# Patient Record
Sex: Male | Born: 1975 | Race: Black or African American | Hispanic: No | Marital: Married | Smoking: Former smoker
Health system: Southern US, Community
[De-identification: ages and names within clinical notes are randomized; demographics above are authoritative.]

## PROBLEM LIST (undated history)

## (undated) DIAGNOSIS — I10 Essential (primary) hypertension: Secondary | ICD-10-CM

## (undated) DIAGNOSIS — I619 Nontraumatic intracerebral hemorrhage, unspecified: Secondary | ICD-10-CM

## (undated) DIAGNOSIS — Z87442 Personal history of urinary calculi: Secondary | ICD-10-CM

## (undated) DIAGNOSIS — I2699 Other pulmonary embolism without acute cor pulmonale: Secondary | ICD-10-CM

## (undated) DIAGNOSIS — I82409 Acute embolism and thrombosis of unspecified deep veins of unspecified lower extremity: Secondary | ICD-10-CM

## (undated) DIAGNOSIS — F32A Depression, unspecified: Secondary | ICD-10-CM

## (undated) DIAGNOSIS — F329 Major depressive disorder, single episode, unspecified: Secondary | ICD-10-CM

## (undated) DIAGNOSIS — I639 Cerebral infarction, unspecified: Secondary | ICD-10-CM

## (undated) DIAGNOSIS — L730 Acne keloid: Secondary | ICD-10-CM

## (undated) DIAGNOSIS — G839 Paralytic syndrome, unspecified: Secondary | ICD-10-CM

## (undated) HISTORY — DX: Nontraumatic intracerebral hemorrhage, unspecified: I61.9

## (undated) HISTORY — DX: Depression, unspecified: F32.A

---
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
## 1999-01-27 ENCOUNTER — Emergency Department (HOSPITAL_COMMUNITY): Admission: EM | Admit: 1999-01-27 | Discharge: 1999-01-27 | Payer: Self-pay | Admitting: Emergency Medicine

## 1999-01-27 ENCOUNTER — Encounter: Payer: Self-pay | Admitting: Emergency Medicine

## 2000-08-18 ENCOUNTER — Emergency Department (HOSPITAL_COMMUNITY): Admission: EM | Admit: 2000-08-18 | Discharge: 2000-08-19 | Payer: Self-pay | Admitting: Emergency Medicine

## 2000-08-19 ENCOUNTER — Encounter: Payer: Self-pay | Admitting: Emergency Medicine

## 2001-04-29 ENCOUNTER — Encounter: Payer: Self-pay | Admitting: Emergency Medicine

## 2001-04-29 ENCOUNTER — Emergency Department (HOSPITAL_COMMUNITY): Admission: EM | Admit: 2001-04-29 | Discharge: 2001-04-29 | Payer: Self-pay | Admitting: *Deleted

## 2001-07-23 ENCOUNTER — Emergency Department (HOSPITAL_COMMUNITY): Admission: EM | Admit: 2001-07-23 | Discharge: 2001-07-23 | Payer: Self-pay | Admitting: *Deleted

## 2001-07-25 ENCOUNTER — Ambulatory Visit (HOSPITAL_COMMUNITY): Admission: RE | Admit: 2001-07-25 | Discharge: 2001-07-25 | Payer: Self-pay | Admitting: Emergency Medicine

## 2001-07-25 ENCOUNTER — Encounter: Payer: Self-pay | Admitting: Emergency Medicine

## 2001-08-13 ENCOUNTER — Encounter: Admission: RE | Admit: 2001-08-13 | Discharge: 2001-09-03 | Payer: Self-pay | Admitting: Orthopaedic Surgery

## 2004-07-09 ENCOUNTER — Emergency Department (HOSPITAL_COMMUNITY): Admission: EM | Admit: 2004-07-09 | Discharge: 2004-07-09 | Payer: Self-pay | Admitting: Emergency Medicine

## 2004-07-10 ENCOUNTER — Ambulatory Visit (HOSPITAL_BASED_OUTPATIENT_CLINIC_OR_DEPARTMENT_OTHER): Admission: RE | Admit: 2004-07-10 | Discharge: 2004-07-10 | Payer: Self-pay | Admitting: Orthopedic Surgery

## 2004-07-10 HISTORY — PX: INCISION AND DRAINAGE ABSCESS: SHX5864

## 2005-12-24 ENCOUNTER — Emergency Department (HOSPITAL_COMMUNITY): Admission: EM | Admit: 2005-12-24 | Discharge: 2005-12-24 | Payer: Self-pay | Admitting: Emergency Medicine

## 2007-03-13 ENCOUNTER — Emergency Department (HOSPITAL_COMMUNITY): Admission: EM | Admit: 2007-03-13 | Discharge: 2007-03-14 | Payer: Self-pay | Admitting: Emergency Medicine

## 2009-12-30 ENCOUNTER — Emergency Department (HOSPITAL_COMMUNITY): Admission: EM | Admit: 2009-12-30 | Discharge: 2009-12-30 | Payer: Self-pay | Admitting: Emergency Medicine

## 2010-06-28 ENCOUNTER — Emergency Department (HOSPITAL_COMMUNITY): Admission: EM | Admit: 2010-06-28 | Discharge: 2010-06-28 | Payer: Self-pay | Admitting: Family Medicine

## 2011-02-07 LAB — CULTURE, ROUTINE-ABSCESS
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
## 2013-02-08 ENCOUNTER — Ambulatory Visit: Payer: Self-pay | Admitting: Family Medicine

## 2013-02-08 VITALS — BP 130/81 | HR 70 | Temp 97.7°F | Resp 18 | Ht 70.5 in | Wt 259.0 lb

## 2013-02-08 MED ORDER — DOXYCYCLINE HYCLATE 100 MG PO TABS: 100.0000 mg | ORAL_TABLET | Freq: Two times a day (BID) | ORAL | Status: DC

## 2013-02-08 MED ORDER — HYDROCODONE-ACETAMINOPHEN 5-325 MG PO TABS: 1.0000 | ORAL_TABLET | Freq: Four times a day (QID) | ORAL | Status: DC | PRN

## 2013-02-08 NOTE — Patient Instructions (Addendum)
Thank you for coming in today. Take the antibiotics for 10 days.   Take the pain medicine as needed.  Come back at the end of the week if it is not getting better.  Come back or go to the emergency room if you notice new weakness new numbness problems walking or bowel or bladder problems.  Pilonidal Cyst A pilonidal cyst occurs when hairs get trapped (ingrown) beneath the skin in the crease between the buttocks over your sacrum (the bone under that crease). Pilonidal cysts are most common in young men with a lot of body hair. When the cyst is ruptured (breaks) or leaking, fluid from the cyst may cause burning and itching. If the cyst becomes infected, it causes a painful swelling filled with pus (abscess). The pus and trapped hairs need to be removed (often by lancing) so that the infection can heal. However, recurrence is common and an operation may be needed to remove the cyst. HOME CARE INSTRUCTIONS   If the cyst was NOT INFECTED:  Keep the area clean and dry. Bathe or shower daily. Wash the area well with a germ-killing soap. Warm tub baths may help prevent infection and help with drainage. Dry the area well with a towel.  Avoid tight clothing to keep area as moisture free as possible.  Keep area between buttocks as free of hair as possible. A depilatory may be used.  If the cyst WAS INFECTED and needed to be drained:  Your caregiver packed the wound with gauze to keep the wound open. This allows the wound to heal from the inside outwards and continue draining.  Return for a wound check in 1 day or as suggested.  If you take tub baths or showers, repack the wound with gauze following them. Sponge baths (at the sink) are a good alternative.  If an antibiotic was ordered to fight the infection, take as directed.  Only take over-the-counter or prescription medicines for pain, discomfort, or fever as directed by your caregiver.  After the drain is removed, use sitz baths for 20 minutes  4 times per day. Clean the wound gently with mild unscented soap, pat dry, and then apply a dry dressing. SEEK MEDICAL CARE IF:   You have increased pain, swelling, redness, drainage, or bleeding from the area.  You have a fever.  You have muscles aches, dizziness, or a general ill feeling. Document Released: 11/07/2000 Document Revised: 02/02/2012 Document Reviewed: 01/05/2009 [facility] Patient Information 2013 [place], [place].

## 2013-02-08 NOTE — Progress Notes (Signed)
Matthew Black is a 37 y.o. male who presents to [facility] today for pain in his low back.  Patient is has developed pain in his coccyx area over the last several days it has worsened. He is a tender spot overlaying his coccyx. He was told this may be a cyst. He denies any fevers chills radiating pain weakness or numbness. He has never had a history of abscess requiring drainage in this area in the past. He notes the area is tender to palpation.     PMH: Reviewed otherwise healthy History  Substance Use Topics  . Smoking status: Current Every Day Smoker -- 0.25 packs/day for 7 years    Types: Cigarettes  . Smokeless tobacco: Not on file  . Alcohol Use: No   ROS as above  Medications reviewed. No current outpatient prescriptions on file.   No current facility-administered medications for this visit.    Exam:  BP 130/81  Pulse 70  Temp(Src) 97.7 F (36.5 C) (Oral)  Resp 18  Ht 5' 10.5" (1.791 m)  Wt 259 lb (117.482 kg)  BMI 36.63 kg/m2  SpO2 97% Gen: Well NAD SKIN: Minimal Fluctuant area with hyperpigmentation overlying the coccyx at the superior aspect of the cleft of the buttocks.  No pus is expressible.   Not very TTP.  Back: Nontender over lumbar thoracic spine. Nontender paraspinal muscles. Normal motion. Pulses capillary refill and sensation are intact distal lower extremities bilaterally.  No results found for this or any previous visit (from the past 72 hour(s)).  Assessment and Plan: 37 y.o. male with pilonidal cyst vs ingrown hair.  Minimal fluctuance. Not very TTP  Not yet ready for drainage if needed at all.  Plan: Antibiotics, and some pain medication.  F/u 3 days if not improving or sooner if worsening

## 2013-08-20 DIAGNOSIS — F17200 Nicotine dependence, unspecified, uncomplicated: Secondary | ICD-10-CM | POA: Insufficient documentation

## 2013-08-20 DIAGNOSIS — Z79899 Other long term (current) drug therapy: Secondary | ICD-10-CM | POA: Insufficient documentation

## 2013-08-20 DIAGNOSIS — R000 Tachycardia, unspecified: Secondary | ICD-10-CM | POA: Insufficient documentation

## 2013-08-20 DIAGNOSIS — Z792 Long term (current) use of antibiotics: Secondary | ICD-10-CM | POA: Insufficient documentation

## 2013-08-20 DIAGNOSIS — L0231 Cutaneous abscess of buttock: Secondary | ICD-10-CM | POA: Insufficient documentation

## 2013-08-20 DIAGNOSIS — L0501 Pilonidal cyst with abscess: Secondary | ICD-10-CM | POA: Insufficient documentation

## 2013-08-20 NOTE — ED Notes (Signed)
Pt states boil on left buttock the size of two quarters. Since Wednesday.

## 2013-08-21 ENCOUNTER — Emergency Department (HOSPITAL_COMMUNITY)
Admission: EM | Admit: 2013-08-21 | Discharge: 2013-08-21 | Disposition: A | Discharge status: Home / Self Care | Payer: Self-pay | Attending: Emergency Medicine | Admitting: Emergency Medicine

## 2013-08-21 DIAGNOSIS — L0591 Pilonidal cyst without abscess: Secondary | ICD-10-CM

## 2013-08-21 DIAGNOSIS — L0231 Cutaneous abscess of buttock: Secondary | ICD-10-CM

## 2013-08-21 MED ORDER — IBUPROFEN 400 MG PO TABS
600.0000 mg | ORAL_TABLET | Freq: Once | ORAL | Status: AC
  Administered 2013-08-21: 600 mg via ORAL
  Filled 2013-08-21 (×2): qty 1

## 2013-08-21 MED ORDER — SULFAMETHOXAZOLE-TMP DS 800-160 MG PO TABS: 1.0000 | ORAL_TABLET | Freq: Two times a day (BID) | ORAL | Status: DC

## 2013-08-21 MED ORDER — HYDROCODONE-ACETAMINOPHEN 5-325 MG PO TABS: 1.0000 | ORAL_TABLET | Freq: Four times a day (QID) | ORAL | Status: DC | PRN

## 2013-08-21 MED ORDER — SULFAMETHOXAZOLE-TMP DS 800-160 MG PO TABS
1.0000 | ORAL_TABLET | Freq: Once | ORAL | Status: AC
  Administered 2013-08-21: 1 via ORAL
  Filled 2013-08-21: qty 1

## 2013-08-21 MED ORDER — IBUPROFEN 600 MG PO TABS: 600.0000 mg | ORAL_TABLET | Freq: Four times a day (QID) | ORAL | Status: DC | PRN

## 2013-08-21 NOTE — ED Provider Notes (Signed)
Medical screening examination/treatment/procedure(s) were performed by non-physician practitioner and as supervising physician I was immediately available for consultation/collaboration.   Amiri Riechers M Kelley Polinsky, MD 08/21/13 0826 

## 2013-08-21 NOTE — ED Provider Notes (Signed)
CSN: [id number]     Arrival date & time 08/20/13  2245 History   First MD Initiated Contact with Patient 08/21/13 0017     Chief Complaint  Patient presents with  . Abscess   (Consider location/radiation/quality/duration/timing/severity/associated sxs/prior Treatment) HPI Comments: Abscess L buttock for the past 2 days "just trying to tough it out"  Patient is a 37 y.o. male presenting with abscess. The history is provided by the patient.  Abscess Location:  Ano-genital Ano-genital abscess location:  L buttock Abscess quality: induration, painful, redness and warmth   Abscess quality: not draining and no fluctuance   Red streaking: no   Pain details:    Duration:  3 days   Timing:  Constant   Progression:  Worsening Chronicity:  New Context: injected drug use   Context: not diabetes and not immunosuppression   Relieved by:  Nothing Associated symptoms: no fever     No past medical history on file. No past surgical history on file. No family history on file. History  Substance Use Topics  . Smoking status: Current Every Day Smoker -- 0.25 packs/day for 7 years    Types: Cigarettes  . Smokeless tobacco: Not on file  . Alcohol Use: No    Review of Systems  Constitutional: Negative for fever and chills.  Musculoskeletal: Negative for myalgias.  Skin: Positive for wound.  All other systems reviewed and are negative.    Allergies  Review of patient's allergies indicates no known allergies.  Home Medications   Current Outpatient Rx  Name  Route  Sig  Dispense  Refill  . doxycycline (VIBRA-TABS) 100 MG tablet   Oral   Take 1 tablet (100 mg total) by mouth 2 (two) times daily.   20 tablet   0   . HYDROcodone-acetaminophen (NORCO) 5-325 MG per tablet   Oral   Take 1 tablet by mouth every 6 (six) hours as needed for pain.   20 tablet   0   . ibuprofen (ADVIL,MOTRIN) 600 MG tablet   Oral   Take 1 tablet (600 mg total) by mouth every 6 (six) hours as needed for  pain.   30 tablet   0   . sulfamethoxazole-trimethoprim (BACTRIM DS) 800-160 MG per tablet   Oral   Take 1 tablet by mouth 2 (two) times daily.   13 tablet   0    BP 137/90  Pulse 102  Temp(Src) 98.3 F (36.8 C) (Oral)  Resp 19  SpO2 97% Physical Exam  Nursing note and vitals reviewed. Constitutional: He is oriented to person, place, and time. He appears well-developed and well-nourished.  HENT:  Head: Normocephalic.  Eyes: Pupils are equal, round, and reactive to light.  Neck: Normal range of motion.  Cardiovascular: Regular rhythm.  Tachycardia present.   Pulmonary/Chest: Effort normal.  Musculoskeletal: Normal range of motion. He exhibits edema.  Neurological: He is alert and oriented to person, place, and time.  Skin: Skin is warm.  3 CM X 2CM warm, red, raised area L buttock not involving the anus     ED Course  INCISION AND DRAINAGE Date/Time: 08/21/2013 1:30 AM Performed by: Arman Filter Authorized by: Arman Filter Consent: Verbal consent obtained. written consent not obtained. Risks and benefits: risks, benefits and alternatives were discussed Consent given by: patient Patient understanding: patient states understanding of the procedure being performed Patient identity confirmed: verbally with patient Type: abscess Body area: anogenital (L buttock) Anesthesia: local infiltration Local anesthetic: lidocaine 1% with epinephrine  Anesthetic total: 3 ml Patient sedated: no Scalpel size: 11 Needle gauge: 22 Incision type: single straight Complexity: simple Drainage: purulent Drainage amount: moderate Wound treatment: wound left open Packing material: 1/4 in iodoform gauze Patient tolerance: Patient tolerated the procedure well with no immediate complications.   (including critical care time) Labs Review Labs Reviewed - No data to display Imaging Review No results found.  MDM   1. Abscess of buttock, left   2. Pilonidal cyst    I&D preformed  with start on antibiotic--Septra patient to remove packing on Wednesday morning     Arman Filter, NP 08/21/13 0134

## 2013-10-05 ENCOUNTER — Encounter (HOSPITAL_COMMUNITY): Payer: Self-pay | Admitting: Emergency Medicine

## 2013-10-05 DIAGNOSIS — W268XXA Contact with other sharp object(s), not elsewhere classified, initial encounter: Secondary | ICD-10-CM | POA: Insufficient documentation

## 2013-10-05 DIAGNOSIS — Y929 Unspecified place or not applicable: Secondary | ICD-10-CM | POA: Insufficient documentation

## 2013-10-05 DIAGNOSIS — Y939 Activity, unspecified: Secondary | ICD-10-CM | POA: Insufficient documentation

## 2013-10-05 DIAGNOSIS — S8990XA Unspecified injury of unspecified lower leg, initial encounter: Secondary | ICD-10-CM | POA: Insufficient documentation

## 2013-10-05 DIAGNOSIS — F17200 Nicotine dependence, unspecified, uncomplicated: Secondary | ICD-10-CM | POA: Insufficient documentation

## 2013-10-05 MED ORDER — TETANUS-DIPHTH-ACELL PERTUSSIS 5-2.5-18.5 LF-MCG/0.5 IM SUSP: 0.5000 mL | Freq: Once | INTRAMUSCULAR | Status: DC

## 2013-10-05 NOTE — ED Notes (Signed)
Presents with left foot pain from stepping on a nail one week ago, unknown last tetanus shot. Puncture site has no redness or edema. Cms intact. Pain is worse with walking.

## 2013-10-06 ENCOUNTER — Emergency Department (HOSPITAL_COMMUNITY)
Admission: EM | Admit: 2013-10-06 | Discharge: 2013-10-06 | Discharge status: Against Medical Advice | Payer: Self-pay | Attending: Emergency Medicine | Admitting: Emergency Medicine

## 2013-10-06 NOTE — ED Notes (Signed)
Called No answer.

## 2014-09-12 ENCOUNTER — Encounter (HOSPITAL_COMMUNITY): Payer: Self-pay | Admitting: Emergency Medicine

## 2014-09-12 ENCOUNTER — Emergency Department (HOSPITAL_COMMUNITY)
Admission: EM | Admit: 2014-09-12 | Discharge: 2014-09-12 | Disposition: A | Discharge status: Home / Self Care | Payer: BC Managed Care – PPO | Source: Home / Self Care

## 2014-09-12 DIAGNOSIS — L739 Follicular disorder, unspecified: Secondary | ICD-10-CM

## 2014-09-12 DIAGNOSIS — L089 Local infection of the skin and subcutaneous tissue, unspecified: Secondary | ICD-10-CM

## 2014-09-12 MED ORDER — DOXYCYCLINE HYCLATE 100 MG PO CAPS: 100.0000 mg | ORAL_CAPSULE | Freq: Two times a day (BID) | ORAL | Status: DC

## 2014-09-12 NOTE — Discharge Instructions (Signed)

## 2014-09-12 NOTE — ED Provider Notes (Signed)
CSN: [id number]     Arrival date & time 09/12/14  0801 History   First MD Initiated Contact with Patient 09/12/14 [phone]     Chief Complaint  Patient presents with  . Mass   (Consider location/radiation/quality/duration/timing/severity/associated sxs/prior Treatment) HPI Comments: Approximately 2 weeks ago applied "[employer]" hair dye as well has having shaved a portion of the back of the neck. This area has been swelling associated with tenderness and pain.    History reviewed. No pertinent past medical history. History reviewed. No pertinent past surgical history. History reviewed. No pertinent family history. History  Substance Use Topics  . Smoking status: Current Every Day Smoker -- 0.25 packs/day for 7 years    Types: Cigarettes  . Smokeless tobacco: Not on file  . Alcohol Use: No    Review of Systems  Constitutional: Negative.   HENT: Negative.   Skin: Negative for rash and wound.       AS per HPI  All other systems reviewed and are negative.   Allergies  Review of patient's allergies indicates no known allergies.  Home Medications   Prior to Admission medications   Medication Sig Start Date End Date Taking? Authorizing Provider  doxycycline (VIBRA-TABS) 100 MG tablet Take 1 tablet (100 mg total) by mouth 2 (two) times daily. 02/08/13   Gregor Hams, MD  doxycycline (VIBRAMYCIN) 100 MG capsule Take 1 capsule (100 mg total) by mouth 2 (two) times daily. 09/12/14   Janne Napoleon, NP  HYDROcodone-acetaminophen (NORCO) 5-325 MG per tablet Take 1 tablet by mouth every 6 (six) hours as needed for pain. 08/21/13   Garald Balding, NP  ibuprofen (ADVIL,MOTRIN) 600 MG tablet Take 1 tablet (600 mg total) by mouth every 6 (six) hours as needed for pain. 08/21/13   Garald Balding, NP  sulfamethoxazole-trimethoprim (BACTRIM DS) 800-160 MG per tablet Take 1 tablet by mouth 2 (two) times daily. 08/21/13   Garald Balding, NP   BP 133/82  Pulse 90  Temp(Src) 99 F (37.2 C) (Oral)  Resp 16   SpO2 98% Physical Exam  Nursing note and vitals reviewed. Constitutional: He is oriented to person, place, and time. He appears well-developed and well-nourished. No distress.  Neck: Normal range of motion. Neck supple.  Pulmonary/Chest: Effort normal. No respiratory distress.  Neurological: He is alert and oriented to person, place, and time.  Skin: Skin is warm and dry. No erythema.  The base of the posterior scalp which was recently shaved prior to sx's is now tender with soft tissue swelling and pain. No erythema or pustules. No draining lesion.  Does not feel fluctuant at this time. No lymphangitis.  Psychiatric: He has a normal mood and affect.    ED Course  Procedures (including critical care time) Labs Review Labs Reviewed - No data to display  Imaging Review No results found.   MDM   1. Folliculitis   2. Soft tissue infection    Doxy bid Warm compresses Ibuprofen 600 tid Return if worse      Janne Napoleon, NP 09/12/14 [phone]

## 2014-09-12 NOTE — ED Notes (Signed)
Reports using hair dye two weeks ago and developed a small bump on back of neck with irritation.  States kept scratching the area and the bump got bigger.  Having sharp pains off/on.

## 2014-09-13 NOTE — ED Provider Notes (Signed)
Medical screening examination/treatment/procedure(s) were performed by resident physician or non-physician practitioner and as supervising physician I was immediately available for consultation/collaboration.   Pauline Good MD.   Billy Fischer, MD 09/13/14 2032

## 2014-09-14 ENCOUNTER — Encounter (HOSPITAL_COMMUNITY): Payer: Self-pay | Admitting: Emergency Medicine

## 2014-09-14 ENCOUNTER — Emergency Department (HOSPITAL_COMMUNITY)
Admission: EM | Admit: 2014-09-14 | Discharge: 2014-09-15 | Disposition: A | Discharge status: Home / Self Care | Payer: BC Managed Care – PPO | Attending: Emergency Medicine | Admitting: Emergency Medicine

## 2014-09-14 DIAGNOSIS — Z720 Tobacco use: Secondary | ICD-10-CM | POA: Diagnosis not present

## 2014-09-14 DIAGNOSIS — L02811 Cutaneous abscess of head [any part, except face]: Secondary | ICD-10-CM | POA: Diagnosis present

## 2014-09-14 DIAGNOSIS — R229 Localized swelling, mass and lump, unspecified: Secondary | ICD-10-CM

## 2014-09-14 DIAGNOSIS — L089 Local infection of the skin and subcutaneous tissue, unspecified: Secondary | ICD-10-CM | POA: Diagnosis not present

## 2014-09-14 NOTE — ED Provider Notes (Signed)
CSN: [id number]     Arrival date & time 09/14/14  2228 History   First MD Initiated Contact with Patient 09/14/14 2343     This chart was scribed for non-physician practitioner, Clayton Bibles PA-C working with Delora Fuel, MD by Forrestine Him, ED Scribe. This patient was seen in room WA07/WA07 and the patient's care was started at 11:46 PM.   Chief Complaint  Patient presents with  . Abscess   The history is provided by the patient. No language interpreter was used.    HPI Comments: Matthew Black is a 38 y.o. male who presents to the Emergency Department complaining of an abscess to the posterior lower occiput x 8 days that has progressively worsened. Pt describes pain as throbbing and states is does not radiate. He denies any recent injury or trauma. However, pt states he noted onset of abscess after getting a hair cut. He has tried warm compresses to the area without any improvement for symptoms. No recent, fever, chills, nausea, vomiting, diarrhea, or abdominal pain. There is no tingling or weakness of the extremities.  [name] admits to a history of abscesses. No known allergies to medications. No other concerns this visit. Pt seen at Urgent Care two days ago, was prescribed doxycycline- is taking this and ibuprofen for the pain.    History reviewed. No pertinent past medical history. History reviewed. No pertinent past surgical history. Family History  Problem Relation Age of Onset  . Diabetes Other    History  Substance Use Topics  . Smoking status: Current Every Day Smoker -- 0.25 packs/day for 7 years    Types: Cigarettes  . Smokeless tobacco: Not on file  . Alcohol Use: No    Review of Systems  Constitutional: Negative for chills.  Gastrointestinal: Negative for nausea, vomiting, abdominal pain and diarrhea.  Skin: Positive for wound.  All other systems reviewed and are negative.     Allergies  Review of patient's allergies indicates no known allergies.  Home  Medications   Prior to Admission medications   Medication Sig Start Date End Date Taking? Authorizing Provider  ibuprofen (ADVIL,MOTRIN) 200 MG tablet Take 800 mg by mouth every 6 (six) hours as needed for moderate pain.   Yes Historical Provider, MD   Triage Vitals: BP 131/77  Pulse 92  Temp(Src) 98.7 F (37.1 C) (Oral)  Resp 18  SpO2 99%   Physical Exam  Nursing note and vitals reviewed. Constitutional: He appears well-developed and well-nourished. No distress.  HENT:  Head: Normocephalic and atraumatic.  Neck: Neck supple.  Pulmonary/Chest: Effort normal.  Neurological: He is alert.  Skin: He is not diaphoretic.  8 cm area of induration over posterior lower occiput without erythema or discharge. Tender to palpation.    ED Course  Procedures (including critical care time)  DIAGNOSTIC STUDIES: Oxygen Saturation is 99% on RA, Normal by my interpretation.    COORDINATION OF CARE: 11:50 PM-Discussed treatment plan with pt at bedside and pt agreed to plan.     Labs Review Labs Reviewed - No data to display  Imaging Review No results found.   EKG Interpretation None      INCISION AND DRAINAGE Performed by: Clayton Bibles Consent: Verbal consent obtained. Risks and benefits: risks, benefits and alternatives were discussed Type: abscess  Body area: posterior neck/occiput  Anesthesia: local infiltration  Incision was made with a scalpel.  Local anesthetic: lidocaine 2% with epinephrine  Anesthetic total: 3 ml  Complexity: complex Blunt dissection to break  up loculations  Drainage: none.  Small amount of sanguinous drainage.   Drainage amount: small  Packing material: none  Patient tolerance: Patient tolerated the procedure well with no immediate complications.     MDM   Final diagnoses:  Soft tissue infection  Soft tissue swelling    Afebrile, nontoxic patient with swelling and pain to the posterior neck that began after having neck shaved at  barber shop.  Has been on doxycycline x 2 days.  Area is indurated but I did attempted I&D without drainage.  Pt only has ibuprofen at home.  Advised continuing doxycycline and I added stronger pain medication (norco).  Discussed strict return precautions.   D/C home.  Discussed result, findings, treatment, and follow up  with patient.  Pt given return precautions.  Pt verbalizes understanding and agrees with plan.        I personally performed the services described in this documentation, which was scribed in my presence. The recorded information has been reviewed and is accurate.    Mabel, PA-C 09/15/14 [phone]

## 2014-09-14 NOTE — ED Notes (Signed)
Pt states he has an abscess to the back of his neck that started about a week ago  Pt states he has been using warm compresses with no relief

## 2014-09-15 MED ORDER — HYDROMORPHONE HCL 2 MG/ML IJ SOLN
2.0000 mg | Freq: Once | INTRAMUSCULAR | Status: AC
  Administered 2014-09-15: 2 mg via INTRAMUSCULAR
  Filled 2014-09-15: qty 1

## 2014-09-15 MED ORDER — HYDROCODONE-ACETAMINOPHEN 5-325 MG PO TABS
1.0000 | ORAL_TABLET | ORAL | Status: DC | PRN
Start: 2014-09-15 — End: 2014-09-23

## 2014-09-15 MED ORDER — LIDOCAINE-EPINEPHRINE (PF) 2 %-1:200000 IJ SOLN
10.0000 mL | Freq: Once | INTRAMUSCULAR | Status: AC
  Administered 2014-09-15: 10 mL
  Filled 2014-09-15: qty 10

## 2014-09-15 NOTE — Discharge Instructions (Signed)
Read the information below.  Use the prescribed medication as directed.  Please discuss all new medications with your pharmacist.  Do not take additional tylenol while taking the prescribed pain medication to avoid overdose.  You may return to the Emergency Department at any time for worsening condition or any new symptoms that concern you.  If you develop redness, increased swelling, uncontrolled pain, or fevers greater than 100.4, return to the ER immediately for a recheck.

## 2014-09-15 NOTE — ED Notes (Signed)
Dry gauze placed over incision on neck, taped down

## 2014-09-15 NOTE — ED Provider Notes (Signed)
Medical screening examination/treatment/procedure(s) were performed by non-physician practitioner and as supervising physician I was immediately available for consultation/collaboration.   Delora Fuel, MD 02/33/43 [phone]

## 2014-09-18 ENCOUNTER — Encounter (HOSPITAL_COMMUNITY): Payer: Self-pay | Admitting: Emergency Medicine

## 2014-09-18 ENCOUNTER — Emergency Department (HOSPITAL_COMMUNITY)
Admission: EM | Admit: 2014-09-18 | Discharge: 2014-09-19 | Disposition: A | Discharge status: Home / Self Care | Payer: BC Managed Care – PPO | Attending: Emergency Medicine | Admitting: Emergency Medicine

## 2014-09-18 DIAGNOSIS — Z720 Tobacco use: Secondary | ICD-10-CM | POA: Diagnosis not present

## 2014-09-18 DIAGNOSIS — L0211 Cutaneous abscess of neck: Secondary | ICD-10-CM | POA: Diagnosis not present

## 2014-09-18 DIAGNOSIS — L0291 Cutaneous abscess, unspecified: Secondary | ICD-10-CM

## 2014-09-18 MED ORDER — LIDOCAINE-EPINEPHRINE 2 %-1:100000 IJ SOLN
20.0000 mL | Freq: Once | INTRAMUSCULAR | Status: AC
  Administered 2014-09-19: 1 mL via INTRADERMAL

## 2014-09-18 NOTE — ED Provider Notes (Signed)
CSN: [id number]     Arrival date & time 09/18/14  2319 History  This chart was scribed for non-physician practitioner working with [name], MD by Mercy Moore, ED Scribe. This patient was seen in room WTR6/WTR6 and the patient's care was started at 11:47 PM.   Chief Complaint  Patient presents with  . Abscess    The history is provided by the patient. No language interpreter was used.   HPI Comments: Matthew Black is a 38 y.o. male who presents to the Emergency Department complaining of abscess to the back of his neck for greater than one week. Patient indicates development of the abscess following recent haircut. Patient has been previously evaluated for same compliant. Patient reports an attempt to lance, but there was no significant drainage. Patient discharged with Vicodin and Doxycycline. Since his visit, patient reports growth, increased swelling and pain to the area. Patient reports attempted treatment with warm compresses at home but denies improvement or drainage.   History reviewed. No pertinent past medical history. History reviewed. No pertinent past surgical history. Family History  Problem Relation Age of Onset  . Diabetes Other    History  Substance Use Topics  . Smoking status: Current Every Day Smoker -- 0.25 packs/day for 7 years    Types: Cigarettes  . Smokeless tobacco: Not on file  . Alcohol Use: No    Review of Systems  Constitutional: Negative for fever and chills.  Gastrointestinal: Negative for nausea and vomiting.  Skin: Negative for color change.       Abscess   Hematological: Negative for adenopathy.   Allergies  Review of patient's allergies indicates no known allergies.  Home Medications   Prior to Admission medications   Medication Sig Start Date End Date Taking? Authorizing Provider  HYDROcodone-acetaminophen (NORCO/VICODIN) 5-325 MG per tablet Take 1-2 tablets by mouth every 4 (four) hours as needed for moderate pain or severe pain.  09/15/14   Clayton Bibles, PA-C  ibuprofen (ADVIL,MOTRIN) 200 MG tablet Take 800 mg by mouth every 6 (six) hours as needed for moderate pain.    Historical Provider, MD   Triage Vitals: BP 142/77  Pulse 84  Temp(Src) 98.1 F (36.7 C) (Oral)  Resp 20  SpO2 95%  Physical Exam  Nursing note and vitals reviewed. Constitutional: He is oriented to person, place, and time. He appears well-developed and well-nourished. No distress.  HENT:  Head: Normocephalic and atraumatic.  Eyes: Conjunctivae and EOM are normal. Right eye exhibits no discharge. Left eye exhibits no discharge.  Neck: Normal range of motion. Neck supple. No tracheal deviation present.  Cardiovascular: Normal rate, regular rhythm and normal heart sounds.   Pulmonary/Chest: Effort normal and breath sounds normal. No respiratory distress.  Abdominal: Soft. There is no tenderness.  Musculoskeletal: Normal range of motion.  Neurological: He is alert and oriented to person, place, and time.  Skin: Skin is warm and dry.  6cm x 6cm area induration and mild fluctuance posterior neck. Hair is still growing on the area. No drainage. Healing incision wound noted.   Psychiatric: He has a normal mood and affect. His behavior is normal.    ED Course  Procedures (including critical care time)  COORDINATION OF CARE: 11:50 PM- Will ultrasound area. Discussed treatment plan with patient at bedside and patient agreed to plan.   Labs Review Labs Reviewed - No data to display  Imaging Review No results found.   EKG Interpretation None      EMERGENCY DEPARTMENT  US SOFT TISSUE INTERPRETATION "Study: Limited Ultrasound of the noted body part in comments below"  INDICATIONS: Soft tissue infection Multiple views of the body part are obtained with a multi-frequency linear probe  PERFORMED BY:  Myself  IMAGES ARCHIVED?: No  SIDE:Midline  BODY PART:Neck  FINDINGS: Cellulitis present  LIMITATIONS:  None  INTERPRETATION:   Cellulitis present  COMMENT:    INCISION AND DRAINAGE Performed by: Faustino Congress Consent: Verbal consent obtained. Risks and benefits: risks, benefits and alternatives were discussed Type: abscess  Body area: posterior neck  Anesthesia: local infiltration  Incision was made with a scalpel.  Local anesthetic: lidocaine 2% with epinephrine  Anesthetic total: 5 ml  Complexity: complex Blunt dissection to break up loculations  Drainage: none  Drainage amount: none  Packing material: 1/4 in iodoform gauze  Patient tolerance: Patient tolerated the procedure well with no immediate complications.   Pt counseled to continue doxycycline. The patient was urged to return to the Emergency Department urgently with worsening pain, swelling, expanding erythema especially if it streaks away from the affected area, fever, or if they have any other concerns.   Percocet given for home. Patient counseled on use of narcotic pain medications. Counseled not to combine these medications with others containing tylenol. Urged not to drink alcohol, drive, or perform any other activities that requires focus while taking these medications. The patient verbalizes understanding and agrees with the plan.   The patient was urged to return to the Emergency Department or go to their PCP in 48 hours for packing removal and wound recheck.  The patient verbalized understanding and stated agreement with this plan.    MDM   Final diagnoses:  Abscess   Patient with posterior neck abscess. He is immunocompetent. Incision again performed tonight. No appreciable drainage returned. I was able to access an abscess cavity. I feel this is less likely a karion. It seems as though the infection is improving. I would continue antibiotics. I packed the area to ensure continued drainage. Patient to return in 2 days for recheck. At that time, decision will need to be made whether to extend antibiotics.  I personally  performed the services described in this documentation, which was scribed in my presence. The recorded information has been reviewed and is accurate.    [name], PA-C 09/19/14 [phone]

## 2014-09-18 NOTE — ED Notes (Signed)
Pt presents with c/o abscess on the back of his neck. Pt has been seen for same over the past week, spot is more swollen at this time, painful to touch.

## 2014-09-19 MED ORDER — LIDOCAINE-EPINEPHRINE 2 %-1:100000 IJ SOLN
INTRAMUSCULAR | Status: AC
Start: 2014-09-19 — End: 2014-09-19
  Administered 2014-09-19: 1 mL via INTRADERMAL
  Filled 2014-09-19: qty 1

## 2014-09-19 MED ORDER — OXYCODONE-ACETAMINOPHEN 5-325 MG PO TABS: 1.0000 | ORAL_TABLET | Freq: Four times a day (QID) | ORAL | Status: DC | PRN

## 2014-09-19 MED ORDER — OXYCODONE-ACETAMINOPHEN 5-325 MG PO TABS
1.0000 | ORAL_TABLET | Freq: Once | ORAL | Status: AC
  Administered 2014-09-19: 1 via ORAL
  Filled 2014-09-19: qty 1

## 2014-09-19 NOTE — Discharge Instructions (Signed)
Please read and follow all provided instructions.  Your diagnoses today include:  1. Abscess     Tests performed today include:  Vital signs. See below for your results today.   Medications prescribed:   Percocet (oxycodone/acetaminophen) - narcotic pain medication  DO NOT drive or perform any activities that require you to be awake and alert because this medicine can make you drowsy. BE VERY CAREFUL not to take multiple medicines containing Tylenol (also called acetaminophen). Doing so can lead to an overdose which can damage your liver and cause liver failure and possibly death.  Take any prescribed medications only as directed.   Home care instructions:   Follow any educational materials contained in this packet  Follow-up instructions: Return to the Emergency Department in 48 hours for packing removal and recheck.  Please follow-up with your primary care provider in the next 1 week for further evaluation of your symptoms.   Return instructions:  Return to the Emergency Department if you have:  Fever  Worsening symptoms  Worsening pain  Worsening swelling  Redness of the skin that moves away from the affected area, especially if it streaks away from the affected area   Any other emergent concerns  Your vital signs today were: BP 142/77   Pulse 84   Temp(Src) 98.1 F (36.7 C) (Oral)   Resp 20   SpO2 95% If your blood pressure (BP) was elevated above 135/85 this visit, please have this repeated by your doctor within one month. --------------

## 2014-09-19 NOTE — ED Provider Notes (Signed)
Medical screening examination/treatment/procedure(s) were performed by non-physician practitioner and as supervising physician I was immediately available for consultation/collaboration.   EKG Interpretation None        [name], MD 09/19/14 [phone]

## 2014-09-21 ENCOUNTER — Emergency Department (HOSPITAL_COMMUNITY): Payer: BC Managed Care – PPO

## 2014-09-21 ENCOUNTER — Encounter (HOSPITAL_COMMUNITY): Payer: Self-pay | Admitting: Emergency Medicine

## 2014-09-21 ENCOUNTER — Inpatient Hospital Stay (HOSPITAL_COMMUNITY)
Admission: EM | Admit: 2014-09-21 | Discharge: 2014-09-23 | DRG: 581 | Disposition: A | Discharge status: Home / Self Care | Payer: BC Managed Care – PPO | Attending: General Surgery | Admitting: General Surgery

## 2014-09-21 ENCOUNTER — Observation Stay (HOSPITAL_COMMUNITY): Payer: BC Managed Care – PPO

## 2014-09-21 DIAGNOSIS — L0211 Cutaneous abscess of neck: Principal | ICD-10-CM | POA: Diagnosis present

## 2014-09-21 DIAGNOSIS — F17210 Nicotine dependence, cigarettes, uncomplicated: Secondary | ICD-10-CM | POA: Diagnosis present

## 2014-09-21 DIAGNOSIS — R221 Localized swelling, mass and lump, neck: Secondary | ICD-10-CM | POA: Diagnosis not present

## 2014-09-21 DIAGNOSIS — Z01818 Encounter for other preprocedural examination: Secondary | ICD-10-CM

## 2014-09-21 LAB — BASIC METABOLIC PANEL
ANION GAP: 14 (ref 5–15)
BUN: 10 mg/dL (ref 6–23)
CO2: 25 meq/L (ref 19–32)
Calcium: 9.5 mg/dL (ref 8.4–10.5)
Chloride: 103 mEq/L (ref 96–112)
Creatinine, Ser: 1.03 mg/dL (ref 0.50–1.35)
GFR calc Af Amer: >90 mL/min (ref >90)
GFR calc non Af Amer: >90 mL/min (ref >90)
Glucose, Bld: 98 mg/dL (ref 70–99)
POTASSIUM: 4 meq/L (ref 3.7–5.3)
Sodium: 142 mEq/L (ref 137–147)

## 2014-09-21 LAB — SURGICAL PCR SCREEN
MRSA, PCR: NEGATIVE
Staphylococcus aureus: NEGATIVE

## 2014-09-21 LAB — CBC WITH DIFFERENTIAL/PLATELET
BASOS ABS: 0 10*3/uL (ref 0.0–0.1)
BASOS PCT: 0 % (ref 0–1)
EOS PCT: 1 % (ref 0–5)
Eosinophils Absolute: 0.2 10*3/uL (ref 0.0–0.7)
HCT: 48.4 % (ref 39.0–52.0)
Hemoglobin: 16.4 g/dL (ref 13.0–17.0)
LYMPHS ABS: 5.2 10*3/uL — AB (ref 0.7–4.0)
LYMPHS PCT: 43 % (ref 12–46)
MCH: 27.8 pg (ref 26.0–34.0)
MCHC: 33.9 g/dL (ref 30.0–36.0)
MCV: 82.2 fL (ref 78.0–100.0)
Monocytes Absolute: 1.2 10*3/uL — ABNORMAL HIGH (ref 0.1–1.0)
Monocytes Relative: 10 % (ref 3–12)
NEUTROS ABS: 5.5 10*3/uL (ref 1.7–7.7)
NEUTROS PCT: 46 % (ref 43–77)
PLATELETS: 277 10*3/uL (ref 150–400)
RBC: 5.89 MIL/uL — ABNORMAL HIGH (ref 4.22–5.81)
RDW: 13.7 % (ref 11.5–15.5)
WBC: 12.1 10*3/uL — ABNORMAL HIGH (ref 4.0–10.5)

## 2014-09-21 MED ORDER — CHLORHEXIDINE GLUCONATE 4 % EX LIQD
1.0000 "application " | Freq: Once | CUTANEOUS | Status: AC
  Administered 2014-09-21: 1 via TOPICAL
  Filled 2014-09-21: qty 15

## 2014-09-21 MED ORDER — CHLORHEXIDINE GLUCONATE 4 % EX LIQD
1.0000 "application " | Freq: Once | CUTANEOUS | Status: AC
  Administered 2014-09-22: 1 via TOPICAL
  Filled 2014-09-21: qty 15

## 2014-09-21 MED ORDER — DIPHENHYDRAMINE HCL 12.5 MG/5ML PO ELIX: 12.5000 mg | ORAL_SOLUTION | Freq: Four times a day (QID) | ORAL | Status: DC | PRN

## 2014-09-21 MED ORDER — OXYCODONE-ACETAMINOPHEN 5-325 MG PO TABS
1.0000 | ORAL_TABLET | ORAL | Status: DC | PRN
  Administered 2014-09-21 – 2014-09-22 (×2): 1 via ORAL
  Filled 2014-09-21: qty 2
  Filled 2014-09-21 (×2): qty 1
  Filled 2014-09-21 (×2): qty 2

## 2014-09-21 MED ORDER — MORPHINE SULFATE 2 MG/ML IJ SOLN
1.0000 mg | INTRAMUSCULAR | Status: DC | PRN
  Filled 2014-09-21: qty 1

## 2014-09-21 MED ORDER — DIPHENHYDRAMINE HCL 50 MG/ML IJ SOLN: 12.5000 mg | Freq: Four times a day (QID) | INTRAMUSCULAR | Status: DC | PRN

## 2014-09-21 MED ORDER — ACETAMINOPHEN 325 MG PO TABS: 650.0000 mg | ORAL_TABLET | Freq: Four times a day (QID) | ORAL | Status: DC | PRN

## 2014-09-21 MED ORDER — HEPARIN SODIUM (PORCINE) 5000 UNIT/ML IJ SOLN
5000.0000 [IU] | Freq: Three times a day (TID) | INTRAMUSCULAR | Status: DC
  Administered 2014-09-21 – 2014-09-22 (×2): 5000 [IU] via SUBCUTANEOUS
  Filled 2014-09-21 (×5): qty 1

## 2014-09-21 MED ORDER — PIPERACILLIN-TAZOBACTAM 3.375 G IVPB
3.3750 g | Freq: Three times a day (TID) | INTRAVENOUS | Status: DC
  Administered 2014-09-21 – 2014-09-23 (×4): 3.375 g via INTRAVENOUS
  Filled 2014-09-21 (×6): qty 50

## 2014-09-21 MED ORDER — SODIUM CHLORIDE 0.9 % IV BOLUS (SEPSIS)
1000.0000 mL | Freq: Once | INTRAVENOUS | Status: AC
  Administered 2014-09-21: 1000 mL via INTRAVENOUS

## 2014-09-21 MED ORDER — MORPHINE SULFATE 4 MG/ML IJ SOLN
4.0000 mg | Freq: Once | INTRAMUSCULAR | Status: AC
  Administered 2014-09-21: 4 mg via INTRAVENOUS
  Filled 2014-09-21: qty 1

## 2014-09-21 MED ORDER — ACETAMINOPHEN 650 MG RE SUPP: 650.0000 mg | Freq: Four times a day (QID) | RECTAL | Status: DC | PRN

## 2014-09-21 MED ORDER — PIPERACILLIN-TAZOBACTAM 3.375 G IVPB 30 MIN
3.3750 g | Freq: Once | INTRAVENOUS | Status: AC
  Administered 2014-09-21: 3.375 g via INTRAVENOUS
  Filled 2014-09-21: qty 50

## 2014-09-21 MED ORDER — IOHEXOL 300 MG/ML  SOLN
100.0000 mL | Freq: Once | INTRAMUSCULAR | Status: AC | PRN
  Administered 2014-09-21: 100 mL via INTRAVENOUS

## 2014-09-21 MED ORDER — VANCOMYCIN HCL IN DEXTROSE 1-5 GM/200ML-% IV SOLN
1000.0000 mg | Freq: Two times a day (BID) | INTRAVENOUS | Status: DC
Start: 2014-09-22 — End: 2014-09-23
  Administered 2014-09-22 – 2014-09-23 (×3): 1000 mg via INTRAVENOUS
  Filled 2014-09-21 (×4): qty 200

## 2014-09-21 MED ORDER — SODIUM CHLORIDE 0.9 % IV SOLN
2500.0000 mg | Freq: Once | INTRAVENOUS | Status: AC
  Administered 2014-09-21: 2500 mg via INTRAVENOUS
  Filled 2014-09-21: qty 2500

## 2014-09-21 NOTE — ED Notes (Signed)
Attempted to call report to RN at [address]

## 2014-09-21 NOTE — H&P (Signed)
Reason for Consult:   Posterior cystic neck mass Referring Physician: Monico Blitz, PA-C   Matthew Black is an 38 y.o. male.   HPI: healthy young male who presented to [facility] 09/12/14 with about a 2 week history of  posterior neck swelling and tenderness. He has had a haircut and use some type of hair dye, prior to this.  Diagnosed as folliculitis, and treated with Doxycycline at that time.   He presented again on 10/22 to the ED, site was more painful and enlarging.  Warm compresses did not relive symptoms.  It was 8 cm area of induration lower occiput at that time without erythema or drainage.   Pt had an I&D at that time, and got a small amount of sanguinous drainage at that time.  There were loculation broken at that time. He was continued on Doxycycline.   He returned again on 10/26/ 15 and notes the site is enlarging, and ongoing pain.  No improvement with drainage, antibiotics and warm compresses.  A second attempt to I&D was made with local anesthesia.  Again it was loculated and broken up with blunt dissection, and packed.  i do not see any cultures of the site.   He returned today for a recheck of his wound and it has not improved. Work up in the ED shows normal VS.  BMP is normal, WBC is up some, but no left shift. CT scan shows:   a 6.7 cm irregular peripherally enhancing mass lesion, likely is cystic centrally and probably necrotic. There is surrounding inflammatory change with invasion into the subjacent musculature. While this could certainly be infectious, the invasive component raises concern for neoplasm.  2. Enlarged posterior level 3 lymph nodes bilaterally, left greater than right could represent metastatic disease. 3. Status post left hemithyroidectomy. 4. Sub cm superior mediastinal lymph nodes.  He report he has never had any surgery.  No nodules or new areas noted by him or his wife as far as he knows.  We are ask to see.     History reviewed. No pertinent past  medical history.  History reviewed. No pertinent past surgical history.    Family History   Problem  Relation  Age of Onset   .  Diabetes  Other       Social History: reports that he has been smoking Cigarettes.  He has a 1.75 pack-year smoking history. He does not have any smokeless tobacco history on file. He reports that he does not drink alcohol or use illicit drugs. Tobacco < 1PPD for 6-7 years Drugs:  None   EtOH;  Occasional Runs a trucking firm. Married 2 children living. Allergies: No Known Allergies  Medications:   Prior to Admission:  (Not in a hospital admission) Scheduled:  Continuous: .  piperacillin-tazobactam  3.375 g (09/21/14 1527)   .  piperacillin-tazobactam (ZOSYN)  IV      .  vancomycin      .  [START ON 09/22/2014] vancomycin       PRN: Anti-infectives     Start        Dose/Rate  Route  Frequency  Ordered  Stop     09/22/14 0400    vancomycin (VANCOCIN) IVPB 1000 mg/200 mL premix      1,000 mg  200 mL/hr over 60 Minutes  Intravenous  Every 12 hours  09/21/14 1530        09/21/14 2200    piperacillin-tazobactam (ZOSYN) IVPB 3.375 g  3.375 g  12.5 mL/hr over 240 Minutes  Intravenous  3 times per day  09/21/14 1530        09/21/14 1530    vancomycin (VANCOCIN) 2,500 mg in sodium chloride 0.9 % 500 mL IVPB      2,500 mg  250 mL/hr over 120 Minutes  Intravenous   Once  09/21/14 1510        09/21/14 1515    piperacillin-tazobactam (ZOSYN) IVPB 3.375 g      3.375 g  100 mL/hr over 30 Minutes  Intravenous   Once  09/21/14 1510             Results for orders placed during the hospital encounter of 09/21/14 (from the past 48 hour(s))   CBC WITH DIFFERENTIAL     Status: Abnormal     Collection Time      09/21/14 12:02 PM       Result  Value  Ref Range     WBC  12.1 (*)  4.0 - 10.5 K/uL     RBC  5.89 (*)  4.22 - 5.81 MIL/uL     Hemoglobin  16.4   13.0 - 17.0 g/dL     HCT  48.4   39.0 - 52.0 %     MCV  82.2   78.0 - 100.0 fL     MCH  27.8   26.0 -  34.0 pg     MCHC  33.9   30.0 - 36.0 g/dL     RDW  13.7   11.5 - 15.5 %     Platelets  277   150 - 400 K/uL     Neutrophils Relative %  46   43 - 77 %     Neutro Abs  5.5   1.7 - 7.7 K/uL     Lymphocytes Relative  43   12 - 46 %     Lymphs Abs  5.2 (*)  0.7 - 4.0 K/uL     Monocytes Relative  10   3 - 12 %     Monocytes Absolute  1.2 (*)  0.1 - 1.0 K/uL     Eosinophils Relative  1   0 - 5 %     Eosinophils Absolute  0.2   0.0 - 0.7 K/uL     Basophils Relative  0   0 - 1 %     Basophils Absolute  0.0   0.0 - 0.1 K/uL   BASIC METABOLIC PANEL     Status: None     Collection Time      09/21/14 12:02 PM       Result  Value  Ref Range     Sodium  142   137 - 147 mEq/L     Potassium  4.0   3.7 - 5.3 mEq/L     Chloride  103   96 - 112 mEq/L     CO2  25   19 - 32 mEq/L     Glucose, Bld  98   70 - 99 mg/dL     BUN  10   6 - 23 mg/dL     Creatinine, Ser  1.03   0.50 - 1.35 mg/dL     Calcium  9.5   8.4 - 10.5 mg/dL     GFR calc non Af Amer  >90   >90 mL/min     GFR calc Af Amer  >90   >90 mL/min  Comment:  (NOTE)        The eGFR has been calculated using the CKD EPI equation.        This calculation has not been validated in all clinical situations.        eGFR's persistently <90 mL/min signify possible Chronic Kidney        Disease.     Anion gap  14   5 - 15     Ct Soft Tissue Neck W Contrast  09/21/2014   CLINICAL DATA:  Palpable mass in the posterior neck.  EXAM: CT NECK WITH CONTRAST  TECHNIQUE: Multidetector CT imaging of the neck was performed using the standard protocol following the bolus administration of intravenous contrast.  CONTRAST:  166m OMNIPAQUE IOHEXOL 300 MG/ML  SOLN  COMPARISON:  Cervical spine radiographs 03/14/2007  FINDINGS: A peripherally enhancing and partially septated mass lesion is present in the left paramedian and posterior neck. This extends into the fascia and is inseparable from the musculature at the superior aspect of the trapezius. Lesion measures 0  4.0 x 6.7 x 6.7 cm. Inflammatory changes surround lesion with some edema in the subcutaneous soft tissues.  Enlarged posterior level 3 lymph nodes are evident bilaterally, measuring 2.7 x 1.3 cm on the left and 1.5 x 1.0 cm on the right.  The tongue base is within normal limits. No focal mucosal or submucosal lesions are present and spur hyoid neck. The parapharyngeal fat is clear. Mild prominence of the palatine tonsils. The salivary glands are normal.  The larynx is within normal limits. Vocal cords are midline and symmetric. No focal mucosal or submucosal lesions are evident. The cartilages are normal.  The patient is status post left hemi thyroidectomy. The right thyroid lobe is normal. Sub cm superior mediastinal lymph nodes are present.  The bone windows are unremarkable.  IMPRESSION: 1. 6.7 cm irregular peripherally enhancing mass lesion, likely is cystic centrally and probably necrotic. There is surrounding inflammatory change with invasion into the subjacent musculature. While this could certainly be infectious, the invasive component raises concern for neoplasm. 2. Enlarged posterior level 3 lymph nodes bilaterally, left greater than right could represent metastatic disease. 3. Status post left hemithyroidectomy. 4. Sub cm superior mediastinal lymph nodes.   Electronically Signed   By: CLawrence SantiagoM.D.   On: 09/21/2014 13:23     Review of Systems  Constitutional: Negative.  Negative for weight loss.  Eyes: Negative.   Respiratory: Negative.   Cardiovascular: Negative.   Gastrointestinal: Negative.   Genitourinary: Negative.   Musculoskeletal: Positive for neck pain.  Skin:       This appeared about 3 weeks ago and has gotten larger with I&D. But it seems to have gotten smaller to him.  Neurological: Negative.   Endo/Heme/Allergies: Negative.   Psychiatric/Behavioral: Positive for depression ((77y/o son killed in MVA last year, he has never been treated for it.).   Blood pressure 140/82,  pulse 91, temperature 97.6 F (36.4 C), temperature source Oral, resp. rate 20, SpO2 100.00%. Physical Exam  Constitutional: He is oriented to person, place, and time. He appears well-developed and well-nourished. No distress.  HENT:   Head: Normocephalic and atraumatic.   Nose: Nose normal.   Mouth/Throat: Oropharynx is clear and moist. No oropharyngeal exudate.  Eyes: Conjunctivae and EOM are normal. Pupils are equal, round, and reactive to light. Right eye exhibits no discharge. Left eye exhibits no discharge. No scleral icterus.  Neck: No JVD present. No tracheal deviation present. Thyromegaly (  right side seems large I do not feel anytning on the left.) present.  Large 6-8 cm palpable mass posterior neck.  It is sore,but no erythema.  Larger since I&D. No erythema or real pain with palpation.  I/D site healing.  Cardiovascular: Normal rate, regular rhythm, normal heart sounds and intact distal pulses.  Exam reveals no gallop.    No murmur heard. Respiratory: Effort normal and breath sounds normal. No stridor. No respiratory distress. He has no wheezes. He has no rales. He exhibits no tenderness.  GI: Soft. Bowel sounds are normal. He exhibits no distension and no mass. There is no tenderness. There is no rebound and no guarding.  Musculoskeletal: He exhibits no edema and no tenderness.  Lymphadenopathy:    He has no cervical adenopathy.  Neurological: He is alert and oriented to person, place, and time. No cranial nerve deficit.  Skin: Skin is warm and dry. No rash noted. He is not diaphoretic. No erythema. No pallor.  Psychiatric: He has a normal mood and affect. His behavior is normal. Judgment and thought content normal.    Assessment/Plan: 1.  Posterior cystic neck mass, with 2 I&D procedures since 09/14/14 that has not improved.  It  has a rim enhancing fluid collection on CT and Dr. Zella Richer thinks it's a deep abscess. 2.  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 3.  History of pilonidal cyst in the past.  Plan:  Admit for IV antibiotics, I&D in OR tomorrow by Dr. Hassell Done.  Matthew Black 09/21/2014, 2:56 PM

## 2014-09-21 NOTE — ED Notes (Signed)
Pt reports he was seen on 10/26 for neck abscess, told to follow up. Pt unsure if abscess is healing properly. Pain 5/10.

## 2014-09-21 NOTE — ED Provider Notes (Signed)
CSN: [id number]     Arrival date & time 09/21/14  1031 History   First MD Initiated Contact with Patient 09/21/14 1057     Chief Complaint  Patient presents with  . abscess eval      (Consider location/radiation/quality/duration/timing/severity/associated sxs/prior Treatment) HPI  Matthew Black is a 38 y.o. male was otherwise healthy aside from tobacco smoking presenting today for wound recheck. Patient has been compliant with his doxycycline which she's had for 10 days for posterior neck abscess. It has been kind D twice. The last I and D was 2 days ago, wound was packed. She denies fever, chills, shortness of breath. pain is severe and patient states that he feels the mass is growing.   History reviewed. No pertinent past medical history. History reviewed. No pertinent past surgical history. Family History  Problem Relation Age of Onset  . Diabetes Other    History  Substance Use Topics  . Smoking status: Current Every Day Smoker -- 0.25 packs/day for 7 years    Types: Cigarettes  . Smokeless tobacco: Not on file  . Alcohol Use: No    Review of Systems  10 systems reviewed and found to be negative, except as noted in the HPI.   Allergies  Review of patient's allergies indicates no known allergies.  Home Medications   Prior to Admission medications   Medication Sig Start Date End Date Taking? Authorizing Provider  HYDROcodone-acetaminophen (NORCO/VICODIN) 5-325 MG per tablet Take 1-2 tablets by mouth every 4 (four) hours as needed for moderate pain or severe pain. 09/15/14   Clayton Bibles, PA-C  ibuprofen (ADVIL,MOTRIN) 200 MG tablet Take 800 mg by mouth every 6 (six) hours as needed for moderate pain.    Historical Provider, MD  oxyCODONE-acetaminophen (PERCOCET/ROXICET) 5-325 MG per tablet Take 1-2 tablets by mouth every 6 (six) hours as needed for severe pain. 09/19/14   [name], PA-C   BP 140/82  Pulse 91  Temp(Src) 97.6 F (36.4 C) (Oral)  Resp 20  SpO2  100% Physical Exam  Nursing note and vitals reviewed. Constitutional: He is oriented to person, place, and time. He appears well-developed and well-nourished. No distress.  HENT:  Head: Normocephalic.  Eyes: Conjunctivae and EOM are normal.  Neck:    10 cm mass to posterior neck, no fluctuance, no drainage, no overlying cellulitis, diffusely tender to palpation.  Cardiovascular: Normal rate.   Pulmonary/Chest: Effort normal. No stridor.  Musculoskeletal: Normal range of motion.  Neurological: He is alert and oriented to person, place, and time.  Psychiatric: He has a normal mood and affect.    ED Course  Procedures (including critical care time) Labs Review Labs Reviewed - No data to display  Imaging Review No results found.   EKG Interpretation None      MDM   Final diagnoses:  Palpable mass of neck     Filed Vitals:   09/21/14 1048 09/21/14 1522 09/21/14 1603 09/21/14 1720  BP: 140/82  135/76 130/71  Pulse: 91  77 76  Temp: 97.6 F (36.4 C)  98.5 F (36.9 C) 98.3 F (36.8 C)  TempSrc: Oral  Oral Oral  Resp: 20  20 20   Height:  5\' 11"  (1.803 m)    Weight:  252 lb (114.306 kg)    SpO2: 100%  100% 100%    Medications  piperacillin-tazobactam (ZOSYN) IVPB 3.375 g (not administered)  vancomycin (VANCOCIN) 2,500 mg in sodium chloride 0.9 % 500 mL IVPB (not administered)  sodium chloride 0.9 %  bolus 1,000 mL (0 mLs Intravenous Stopped 09/21/14 1456)  iohexol (OMNIPAQUE) 300 MG/ML solution 100 mL (100 mLs Intravenous Contrast Given 09/21/14 1248)    Matthew Black is a 39 y.o. male presenting with large 8 cm neck mass. Patient has been on doxycycline for 10 days, he has had 2 incision and drainage performed he returns today for wound recheck he states that the abscess is growing larger. Has no systemic signs of infection. CT pending.  CT is concerning for invasion into musculature, possibly malignant in nature. Discussed with attending physician, I have also  consulted general surgery. They have evaluated the patient and feel that this is likely an abscess, they will taken to the OR to I and D  Monico Blitz, PA-C 09/21/14 2223

## 2014-09-21 NOTE — Consult Note (Signed)
Reason for Consult:   Posterior cystic neck mass. Referring Physician: Monico Blitz, PA-C   Matthew Black is an 38 y.o. male.  HPI: healthy young male who presented to [facility] 09/12/14 with about a 2 week history of  posterior neck swelling and tenderness. He has has a haircut and use some type of hair dye, prior to this.  Diagnosed as folliculitis, and treated with Doxycycline.   He presented again on 10/22 to the ED, site was more painful and enlarging.  Warm compresses did not relive symptoms.  It was 8 cm area of induration lower occiput at that time without erythema or drainage.   Pt had an I&D at that time, and got a small amount of sanguinous drainage at that time.  There were loculation broken at that time. He was continued on Doxycycline.  He returned again on 10/26/ 15 and notes the site is enlarging, and ongoing pain.  No improvement with drainage, antibiotics and warm compresses.  A second attempt to I&D was made with local anesthesia.  Again it was loculated and broken up with blunt dissection, and packed.  i do not see any cultures of the site.  He returned today for a recheck of his wound and it has not improved. Work up in the ED shows normal VS.  BMP is normal, WBC is up some, but no left shift. CT scan shows:   a 6.7 cm irregular peripherally enhancing mass lesion, likely is cystic centrally and probably necrotic. There is surrounding inflammatory change with invasion into the subjacent musculature. While this could certainly be infectious, the invasive component raises concern for neoplasm.  2. Enlarged posterior level 3 lymph nodes bilaterally, left greater than right could represent metastatic disease. 3. Status post left hemithyroidectomy. 4. Sub cm superior mediastinal lymph nodes.  He report he has never had any surgery.  No nodules or new areas noted by him or his wife as far as he knows.  We are ask to see.     History reviewed. No pertinent past medical  history.  History reviewed. No pertinent past surgical history.  Family History  Problem Relation Age of Onset  . Diabetes Other     Social History:  reports that he has been smoking Cigarettes.  He has a 1.75 pack-year smoking history. He does not have any smokeless tobacco history on file. He reports that he does not drink alcohol or use illicit drugs. Tobacco < 1PPD for 6-7 years Drugs:  None  EtOH;  Occasional Runs a trucking firm. Married 2 children living. Allergies: No Known Allergies  Medications:  Prior to Admission:  (Not in a hospital admission) Scheduled:  Continuous: . piperacillin-tazobactam 3.375 g (09/21/14 1527)  . piperacillin-tazobactam (ZOSYN)  IV    . vancomycin    . [START ON 09/22/2014] vancomycin     PRN: Anti-infectives   Start     Dose/Rate Route Frequency Ordered Stop   09/22/14 0400  vancomycin (VANCOCIN) IVPB 1000 mg/200 mL premix     1,000 mg 200 mL/hr over 60 Minutes Intravenous Every 12 hours 09/21/14 1530     09/21/14 2200  piperacillin-tazobactam (ZOSYN) IVPB 3.375 g     3.375 g 12.5 mL/hr over 240 Minutes Intravenous 3 times per day 09/21/14 1530     09/21/14 1530  vancomycin (VANCOCIN) 2,500 mg in sodium chloride 0.9 % 500 mL IVPB     2,500 mg 250 mL/hr over 120 Minutes Intravenous  Once 09/21/14 1510  09/21/14 1515  piperacillin-tazobactam (ZOSYN) IVPB 3.375 g     3.375 g 100 mL/hr over 30 Minutes Intravenous  Once 09/21/14 1510        Results for orders placed during the hospital encounter of 09/21/14 (from the past 48 hour(s))  CBC WITH DIFFERENTIAL     Status: Abnormal   Collection Time    09/21/14 12:02 PM      Result Value Ref Range   WBC 12.1 (*) 4.0 - 10.5 K/uL   RBC 5.89 (*) 4.22 - 5.81 MIL/uL   Hemoglobin 16.4  13.0 - 17.0 g/dL   HCT 48.4  39.0 - 52.0 %   MCV 82.2  78.0 - 100.0 fL   MCH 27.8  26.0 - 34.0 pg   MCHC 33.9  30.0 - 36.0 g/dL   RDW 13.7  11.5 - 15.5 %   Platelets 277  150 - 400 K/uL   Neutrophils  Relative % 46  43 - 77 %   Neutro Abs 5.5  1.7 - 7.7 K/uL   Lymphocytes Relative 43  12 - 46 %   Lymphs Abs 5.2 (*) 0.7 - 4.0 K/uL   Monocytes Relative 10  3 - 12 %   Monocytes Absolute 1.2 (*) 0.1 - 1.0 K/uL   Eosinophils Relative 1  0 - 5 %   Eosinophils Absolute 0.2  0.0 - 0.7 K/uL   Basophils Relative 0  0 - 1 %   Basophils Absolute 0.0  0.0 - 0.1 K/uL  BASIC METABOLIC PANEL     Status: None   Collection Time    09/21/14 12:02 PM      Result Value Ref Range   Sodium 142  137 - 147 mEq/L   Potassium 4.0  3.7 - 5.3 mEq/L   Chloride 103  96 - 112 mEq/L   CO2 25  19 - 32 mEq/L   Glucose, Bld 98  70 - 99 mg/dL   BUN 10  6 - 23 mg/dL   Creatinine, Ser 1.03  0.50 - 1.35 mg/dL   Calcium 9.5  8.4 - 10.5 mg/dL   GFR calc non Af Amer >90  >90 mL/min   GFR calc Af Amer >90  >90 mL/min   Comment: (NOTE)     The eGFR has been calculated using the CKD EPI equation.     This calculation has not been validated in all clinical situations.     eGFR's persistently <90 mL/min signify possible Chronic Kidney     Disease.   Anion gap 14  5 - 15    Ct Soft Tissue Neck W Contrast  09/21/2014   CLINICAL DATA:  Palpable mass in the posterior neck.  EXAM: CT NECK WITH CONTRAST  TECHNIQUE: Multidetector CT imaging of the neck was performed using the standard protocol following the bolus administration of intravenous contrast.  CONTRAST:  143m OMNIPAQUE IOHEXOL 300 MG/ML  SOLN  COMPARISON:  Cervical spine radiographs 03/14/2007  FINDINGS: A peripherally enhancing and partially septated mass lesion is present in the left paramedian and posterior neck. This extends into the fascia and is inseparable from the musculature at the superior aspect of the trapezius. Lesion measures 0 4.0 x 6.7 x 6.7 cm. Inflammatory changes surround lesion with some edema in the subcutaneous soft tissues.  Enlarged posterior level 3 lymph nodes are evident bilaterally, measuring 2.7 x 1.3 cm on the left and 1.5 x 1.0 cm on the  right.  The tongue base is within normal limits. No  focal mucosal or submucosal lesions are present and spur hyoid neck. The parapharyngeal fat is clear. Mild prominence of the palatine tonsils. The salivary glands are normal.  The larynx is within normal limits. Vocal cords are midline and symmetric. No focal mucosal or submucosal lesions are evident. The cartilages are normal.  The patient is status post left hemi thyroidectomy. The right thyroid lobe is normal. Sub cm superior mediastinal lymph nodes are present.  The bone windows are unremarkable.  IMPRESSION: 1. 6.7 cm irregular peripherally enhancing mass lesion, likely is cystic centrally and probably necrotic. There is surrounding inflammatory change with invasion into the subjacent musculature. While this could certainly be infectious, the invasive component raises concern for neoplasm. 2. Enlarged posterior level 3 lymph nodes bilaterally, left greater than right could represent metastatic disease. 3. Status post left hemithyroidectomy. 4. Sub cm superior mediastinal lymph nodes.   Electronically Signed   By: Lawrence Santiago M.D.   On: 09/21/2014 13:23    Review of Systems  Constitutional: Negative.  Negative for weight loss.  Eyes: Negative.   Respiratory: Negative.   Cardiovascular: Negative.   Gastrointestinal: Negative.   Genitourinary: Negative.   Musculoskeletal: Positive for neck pain.  Skin:       This appeared about 3 weeks ago and has gotten larger with I&D. But it seems to have gotten smaller to him.  Neurological: Negative.   Endo/Heme/Allergies: Negative.   Psychiatric/Behavioral: Positive for depression (4 y/o son killed in MVA last year, he has never been treated for it.).   Blood pressure 140/82, pulse 91, temperature 97.6 F (36.4 C), temperature source Oral, resp. rate 20, SpO2 100.00%. Physical Exam  Constitutional: He is oriented to person, place, and time. He appears well-developed and well-nourished. No distress.   HENT:  Head: Normocephalic and atraumatic.  Nose: Nose normal.  Mouth/Throat: Oropharynx is clear and moist. No oropharyngeal exudate.  Eyes: Conjunctivae and EOM are normal. Pupils are equal, round, and reactive to light. Right eye exhibits no discharge. Left eye exhibits no discharge. No scleral icterus.  Neck: No JVD present. No tracheal deviation present. Thyromegaly (right side seems large I do not feel anytning on the left.) present.  Large 6-8 cm palpable mass posterior neck.  It is sore,but no erythema.  Larger since I&D. No erythema or real pain with palpation.  I/D site healing.  Cardiovascular: Normal rate, regular rhythm, normal heart sounds and intact distal pulses.  Exam reveals no gallop.   No murmur heard. Respiratory: Effort normal and breath sounds normal. No stridor. No respiratory distress. He has no wheezes. He has no rales. He exhibits no tenderness.  GI: Soft. Bowel sounds are normal. He exhibits no distension and no mass. There is no tenderness. There is no rebound and no guarding.  Musculoskeletal: He exhibits no edema and no tenderness.  Lymphadenopathy:    He has no cervical adenopathy.  Neurological: He is alert and oriented to person, place, and time. No cranial nerve deficit.  Skin: Skin is warm and dry. No rash noted. He is not diaphoretic. No erythema. No pallor.  Psychiatric: He has a normal mood and affect. His behavior is normal. Judgment and thought content normal.    Assessment/Plan: 1.  Posterior cystic neck mass, with 2 I&D procedures since 09/14/14 that has not improved.  It  Is rim enhancing on CT and Dr. Zella Richer thinks it's a deep abscess. 2.  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  History of pilonidal cyst in  the past.  Plan:  Admit for IV antibiotics, I&D in OR tomorrow by Dr. Hassell Done.  Marchetta Navratil 09/21/2014, 2:56 PM

## 2014-09-21 NOTE — Progress Notes (Signed)
Nursing:  Per previous nurse, who was leaving stated that lab had drawn blood culture,but ED doctor wanted it cancelled. Dr. Johney Maine was asked if Blood culture should be kept or cancelled but he did not know anything about this, so he said to cancel it if that was the previous doctors orders.     Nurse cancelled blood cultures.  Cyndia Diver, RN

## 2014-09-21 NOTE — Progress Notes (Signed)
  CARE MANAGEMENT ED NOTE 09/21/2014  Patient:  RAED, SCHALK   Account Number:  [id number]  Date Initiated:  09/21/2014  Documentation initiated by:  Livia Snellen  Subjective/Objective Assessment:   Patient presents to Ed with neck swelling and redness for two weeks.     Subjective/Objective Assessment Detail:     Action/Plan:   Action/Plan Detail:   Anticipated DC Date:       Status Recommendation to Physician:   Result of Recommendation:    Other ED [place]  Other  PCP issues    Choice offered to / List presented to:            Status of service:  Completed, signed off  ED Comments:   ED Comments Detail:  EDCM spoke to patient at bedside.  Patient confirms he does not have a pcp.  [facility] provided patient list of pcps who accept BCBS insurnace within a ten mile radius of patient's zip code [id number].  Patient thankful for resources.  No further EDCM needs at this time.

## 2014-09-21 NOTE — ED Notes (Signed)
Per verbal order form Surgery Cultures not needed.

## 2014-09-21 NOTE — Progress Notes (Signed)
ANTIBIOTIC CONSULT NOTE - INITIAL  Pharmacy Consult for Vancomycin, Zosyn Indication: Cervical neck abscess w/ cellulitis   Assessment: 38yoM presenting with worsening of posterior neck abscess for past 2 wks.  Previous I&D x 2 with no improvement.  Imaging planned to evaluate extent of abscess.  PMH non-contributory.  10/29 >> vancomycin >> 10/29 >> Zosyn  Afebrile WBC mildly elevated at 12.1 Renal function intact; CrCl ~100 normalized  10/29 blood: sent x 2 10/29 wound Cx: sent  Goal of Therapy:  Vancomycin trough level 15-20 mcg/ml Zosyn per renal function  Plan:  Zosyn 3.375 g IV x 1 over 30 minutes, then starting at 2200 tonight, given every 8 hours over 4 hrs.  Vancomycin 2500 mg IV x 1, then 1000 mg IV q12 starting at 0400 on 10/30 Measure antibiotic drug levels at steady state Follow up culture results  No Known Allergies  Patient Measurements:   Adjusted Body Weight: 91kg  Vital Signs: Temp: 97.6 F (36.4 C) (10/29 1048) Temp Source: Oral (10/29 1048) BP: 140/82 mmHg (10/29 1048) Pulse Rate: 91 (10/29 1048) Intake/Output from previous day:   Intake/Output from this shift:    Labs:  Recent Labs  09/21/14 1202  WBC 12.1*  HGB 16.4  PLT 277  CREATININE 1.03   The CrCl is unknown because both a height and weight (above a minimum accepted value) are required for this calculation. No results found for this basename: VANCOTROUGH, VANCOPEAK, VANCORANDOM, GENTTROUGH, GENTPEAK, GENTRANDOM, TOBRATROUGH, TOBRAPEAK, TOBRARND, AMIKACINPEAK, AMIKACINTROU, AMIKACIN,  in the last 72 hours   Microbiology: No results found for this or any previous visit (from the past 720 hour(s)).  Anti-infectives   None       Reuel Boom, PharmD Pager: [phone] 09/21/2014, 3:10 PM

## 2014-09-22 ENCOUNTER — Encounter (HOSPITAL_COMMUNITY): Payer: BC Managed Care – PPO | Admitting: Anesthesiology

## 2014-09-22 ENCOUNTER — Observation Stay (HOSPITAL_COMMUNITY): Payer: BC Managed Care – PPO | Admitting: Anesthesiology

## 2014-09-22 ENCOUNTER — Encounter (HOSPITAL_COMMUNITY): Admission: EM | Disposition: A | Discharge status: Home / Self Care | Payer: Self-pay | Source: Home / Self Care

## 2014-09-22 ENCOUNTER — Encounter (HOSPITAL_COMMUNITY): Payer: Self-pay | Admitting: Anesthesiology

## 2014-09-22 DIAGNOSIS — F17210 Nicotine dependence, cigarettes, uncomplicated: Secondary | ICD-10-CM | POA: Diagnosis present

## 2014-09-22 DIAGNOSIS — L0211 Cutaneous abscess of neck: Secondary | ICD-10-CM | POA: Diagnosis present

## 2014-09-22 DIAGNOSIS — R221 Localized swelling, mass and lump, neck: Secondary | ICD-10-CM | POA: Diagnosis present

## 2014-09-22 HISTORY — PX: INCISION AND DRAINAGE ABSCESS: SHX5864

## 2014-09-22 LAB — CBC
HCT: 45 % (ref 39.0–52.0)
HCT: 42.6 % (ref 39.0–52.0)
Hemoglobin: 15.4 g/dL (ref 13.0–17.0)
Hemoglobin: 14.5 g/dL (ref 13.0–17.0)
MCH: 27.9 pg (ref 26.0–34.0)
MCH: 27.9 pg (ref 26.0–34.0)
MCHC: 34.2 g/dL (ref 30.0–36.0)
MCHC: 34 g/dL (ref 30.0–36.0)
MCV: 81.5 fL (ref 78.0–100.0)
MCV: 82.1 fL (ref 78.0–100.0)
PLATELETS: 263 10*3/uL (ref 150–400)
PLATELETS: 267 10*3/uL (ref 150–400)
RBC: 5.52 MIL/uL (ref 4.22–5.81)
RBC: 5.19 MIL/uL (ref 4.22–5.81)
RDW: 13.7 % (ref 11.5–15.5)
RDW: 13.7 % (ref 11.5–15.5)
WBC: 10.8 10*3/uL — ABNORMAL HIGH (ref 4.0–10.5)
WBC: 9.7 10*3/uL (ref 4.0–10.5)

## 2014-09-22 LAB — COMPREHENSIVE METABOLIC PANEL
ALT: 22 U/L (ref 0–53)
AST: 18 U/L (ref 0–37)
Albumin: 3.6 g/dL (ref 3.5–5.2)
Alkaline Phosphatase: 118 U/L — ABNORMAL HIGH (ref 39–117)
Anion gap: 14 (ref 5–15)
BILIRUBIN TOTAL: 0.6 mg/dL (ref 0.3–1.2)
BUN: 8 mg/dL (ref 6–23)
CALCIUM: 9 mg/dL (ref 8.4–10.5)
CHLORIDE: 102 meq/L (ref 96–112)
CO2: 22 meq/L (ref 19–32)
Creatinine, Ser: 0.99 mg/dL (ref 0.50–1.35)
GLUCOSE: 118 mg/dL — AB (ref 70–99)
Potassium: 3.9 mEq/L (ref 3.7–5.3)
SODIUM: 138 meq/L (ref 137–147)
Total Protein: 7.8 g/dL (ref 6.0–8.3)

## 2014-09-22 LAB — CREATININE, SERUM
Creatinine, Ser: 1.15 mg/dL (ref 0.50–1.35)
GFR calc non Af Amer: 80 mL/min — ABNORMAL LOW (ref >90)

## 2014-09-22 SURGERY — INCISION AND DRAINAGE, ABSCESS
Anesthesia: General

## 2014-09-22 MED ORDER — KCL IN DEXTROSE-NACL 20-5-0.45 MEQ/L-%-% IV SOLN
INTRAVENOUS | Status: DC
  Administered 2014-09-22: 50 mL/h via INTRAVENOUS
  Filled 2014-09-22 (×2): qty 1000

## 2014-09-22 MED ORDER — METOCLOPRAMIDE HCL 5 MG/ML IJ SOLN
INTRAMUSCULAR | Status: AC
  Filled 2014-09-22: qty 2

## 2014-09-22 MED ORDER — PHENYLEPHRINE HCL 10 MG/ML IJ SOLN
INTRAMUSCULAR | Status: DC | PRN
  Administered 2014-09-22: 120 ug via INTRAVENOUS

## 2014-09-22 MED ORDER — LIDOCAINE-EPINEPHRINE 1 %-1:100000 IJ SOLN
INTRAMUSCULAR | Status: DC | PRN
  Administered 2014-09-22: 8 mL

## 2014-09-22 MED ORDER — FENTANYL CITRATE 0.05 MG/ML IJ SOLN
INTRAMUSCULAR | Status: AC
  Filled 2014-09-22: qty 2

## 2014-09-22 MED ORDER — DEXAMETHASONE SODIUM PHOSPHATE 10 MG/ML IJ SOLN
INTRAMUSCULAR | Status: AC
  Filled 2014-09-22: qty 1

## 2014-09-22 MED ORDER — HEPARIN SODIUM (PORCINE) 5000 UNIT/ML IJ SOLN
5000.0000 [IU] | Freq: Three times a day (TID) | INTRAMUSCULAR | Status: DC
  Administered 2014-09-22 – 2014-09-23 (×2): 5000 [IU] via SUBCUTANEOUS
  Filled 2014-09-22 (×5): qty 1

## 2014-09-22 MED ORDER — PROPOFOL 10 MG/ML IV BOLUS
INTRAVENOUS | Status: AC
  Filled 2014-09-22: qty 20

## 2014-09-22 MED ORDER — FENTANYL CITRATE 0.05 MG/ML IJ SOLN
INTRAMUSCULAR | Status: AC
  Filled 2014-09-22: qty 5

## 2014-09-22 MED ORDER — 0.9 % SODIUM CHLORIDE (POUR BTL) OPTIME
TOPICAL | Status: DC | PRN
  Administered 2014-09-22: 1000 mL

## 2014-09-22 MED ORDER — HYDROMORPHONE HCL 1 MG/ML IJ SOLN: 0.5000 mg | INTRAMUSCULAR | Status: DC | PRN

## 2014-09-22 MED ORDER — LACTATED RINGERS IV SOLN: INTRAVENOUS | Status: DC

## 2014-09-22 MED ORDER — MIDAZOLAM HCL 5 MG/5ML IJ SOLN
INTRAMUSCULAR | Status: DC | PRN
  Administered 2014-09-22: 2 mg via INTRAVENOUS

## 2014-09-22 MED ORDER — CISATRACURIUM BESYLATE 20 MG/10ML IV SOLN
INTRAVENOUS | Status: AC
  Filled 2014-09-22: qty 10

## 2014-09-22 MED ORDER — OXYCODONE-ACETAMINOPHEN 5-325 MG PO TABS
1.0000 | ORAL_TABLET | Freq: Four times a day (QID) | ORAL | Status: DC | PRN
  Administered 2014-09-22: 1 via ORAL
  Administered 2014-09-22 – 2014-09-23 (×2): 2 via ORAL

## 2014-09-22 MED ORDER — PROMETHAZINE HCL 25 MG/ML IJ SOLN: 6.2500 mg | INTRAMUSCULAR | Status: DC | PRN

## 2014-09-22 MED ORDER — ONDANSETRON HCL 4 MG/2ML IJ SOLN: 4.0000 mg | Freq: Four times a day (QID) | INTRAMUSCULAR | Status: DC | PRN

## 2014-09-22 MED ORDER — PROPOFOL 10 MG/ML IV BOLUS
INTRAVENOUS | Status: DC | PRN
  Administered 2014-09-22: 200 mg via INTRAVENOUS

## 2014-09-22 MED ORDER — LACTATED RINGERS IV SOLN
INTRAVENOUS | Status: DC | PRN
  Administered 2014-09-22 (×2): via INTRAVENOUS

## 2014-09-22 MED ORDER — PHENYLEPHRINE 40 MCG/ML (10ML) SYRINGE FOR IV PUSH (FOR BLOOD PRESSURE SUPPORT)
PREFILLED_SYRINGE | INTRAVENOUS | Status: AC
  Filled 2014-09-22: qty 10

## 2014-09-22 MED ORDER — FENTANYL CITRATE 0.05 MG/ML IJ SOLN
INTRAMUSCULAR | Status: DC | PRN
  Administered 2014-09-22: 100 ug via INTRAVENOUS

## 2014-09-22 MED ORDER — SUCCINYLCHOLINE CHLORIDE 20 MG/ML IJ SOLN
INTRAMUSCULAR | Status: DC | PRN
  Administered 2014-09-22: 100 mg via INTRAVENOUS

## 2014-09-22 MED ORDER — ONDANSETRON HCL 4 MG PO TABS: 4.0000 mg | ORAL_TABLET | Freq: Four times a day (QID) | ORAL | Status: DC | PRN

## 2014-09-22 MED ORDER — ONDANSETRON HCL 4 MG/2ML IJ SOLN
INTRAMUSCULAR | Status: AC
  Filled 2014-09-22: qty 2

## 2014-09-22 MED ORDER — LIDOCAINE-EPINEPHRINE (PF) 1 %-1:200000 IJ SOLN
INTRAMUSCULAR | Status: AC
  Filled 2014-09-22: qty 10

## 2014-09-22 MED ORDER — MIDAZOLAM HCL 2 MG/2ML IJ SOLN
INTRAMUSCULAR | Status: AC
  Filled 2014-09-22: qty 2

## 2014-09-22 MED ORDER — FENTANYL CITRATE 0.05 MG/ML IJ SOLN
25.0000 ug | INTRAMUSCULAR | Status: DC | PRN
  Administered 2014-09-22: 50 ug via INTRAVENOUS

## 2014-09-22 SURGICAL SUPPLY — 34 items
BLADE SURG 15 STRL LF DISP TIS (BLADE) ×1 IMPLANT
BLADE SURG 15 STRL SS (BLADE) ×3
BNDG GAUZE ELAST 4 BULKY (GAUZE/BANDAGES/DRESSINGS) ×2 IMPLANT
CANISTER SUCTION 2500CC (MISCELLANEOUS) ×3 IMPLANT
COVER SURGICAL LIGHT HANDLE (MISCELLANEOUS) ×3 IMPLANT
DECANTER SPIKE VIAL GLASS SM (MISCELLANEOUS) IMPLANT
DRAPE LAPAROSCOPIC ABDOMINAL (DRAPES) IMPLANT
DRSG PAD ABDOMINAL 8X10 ST (GAUZE/BANDAGES/DRESSINGS) IMPLANT
ELECT REM PT RETURN 9FT ADLT (ELECTROSURGICAL) ×3
ELECTRODE REM PT RTRN 9FT ADLT (ELECTROSURGICAL) ×1 IMPLANT
GAUZE IODOFORM PACK 1/2 7832 (GAUZE/BANDAGES/DRESSINGS) ×2 IMPLANT
GAUZE SPONGE 4X4 12PLY STRL (GAUZE/BANDAGES/DRESSINGS) IMPLANT
GLOVE BIO SURGEON STRL SZ7.5 (GLOVE) ×3 IMPLANT
GOWN SPEC L4 XLG W/TWL (GOWN DISPOSABLE) ×3 IMPLANT
GOWN STRL REUS W/TWL LRG LVL3 (GOWN DISPOSABLE) ×6 IMPLANT
GOWN STRL REUS W/TWL XL LVL3 (GOWN DISPOSABLE) ×9 IMPLANT
KIT BASIN OR (CUSTOM PROCEDURE TRAY) ×3 IMPLANT
NDL HYPO 25X1 1.5 SAFETY (NEEDLE) IMPLANT
NEEDLE HYPO 25X1 1.5 SAFETY (NEEDLE) IMPLANT
NS IRRIG 1000ML POUR BTL (IV SOLUTION) ×3 IMPLANT
PAD ABD 8X10 STRL (GAUZE/BANDAGES/DRESSINGS) ×2 IMPLANT
PENCIL BUTTON HOLSTER BLD 10FT (ELECTRODE) ×3 IMPLANT
SPONGE LAP 18X18 X RAY DECT (DISPOSABLE) ×3 IMPLANT
SUT MNCRL AB 4-0 PS2 18 (SUTURE) IMPLANT
SUT VIC AB 3-0 SH 27 (SUTURE)
SUT VIC AB 3-0 SH 27XBRD (SUTURE) IMPLANT
SWAB COLLECTION DEVICE MRSA (MISCELLANEOUS) IMPLANT
SYR BULB 3OZ (MISCELLANEOUS) ×3 IMPLANT
SYR CONTROL 10ML LL (SYRINGE) IMPLANT
TAPE CLOTH SURG 4X10 WHT LF (GAUZE/BANDAGES/DRESSINGS) ×2 IMPLANT
TOWEL OR 17X26 10 PK STRL BLUE (TOWEL DISPOSABLE) ×3 IMPLANT
TUBE ANAEROBIC SPECIMEN COL (MISCELLANEOUS) IMPLANT
WATER STERILE IRR 1000ML POUR (IV SOLUTION) IMPLANT
YANKAUER SUCT BULB TIP NO VENT (SUCTIONS) ×3 IMPLANT

## 2014-09-22 NOTE — H&P (Signed)
Patient seen and examined.  Findings are most consistent with a deep subcutaneous abscess which will required operative drainage.

## 2014-09-22 NOTE — Interval H&P Note (Signed)
History and Physical Interval Note:  09/22/2014 12:32 PM  Matthew Black  has presented today for surgery, with the diagnosis of Neck abscess  The various methods of treatment have been discussed with the patient and family. After consideration of risks, benefits and other options for treatment, the patient has consented to  Procedure(s): INCISION AND DRAINAGE ABSCESS (N/A) as a surgical intervention .  The patient's history has been reviewed, patient examined, no change in status, stable for surgery.  I have reviewed the patient's chart and labs.  Questions were answered to the patient's satisfaction.     [name]

## 2014-09-22 NOTE — Interval H&P Note (Signed)
History and Physical Interval Note:  09/22/2014 12:37 PM  Matthew Black  has presented today for surgery, with the diagnosis of Neck abscess  The various methods of treatment have been discussed with the patient and family. After consideration of risks, benefits and other options for treatment, the patient has consented to  Procedure(s): INCISION AND DRAINAGE ABSCESS (N/A) as a surgical intervention .  The patient's history has been reviewed, patient examined, no change in status, stable for surgery.  I have reviewed the patient's chart and labs.  Questions were answered to the patient's satisfaction.     Wiletta Bermingham B

## 2014-09-22 NOTE — Consult Note (Signed)
Patient seen and examined.  Agree with PA's note.  

## 2014-09-22 NOTE — Progress Notes (Signed)
UR completed 

## 2014-09-22 NOTE — Progress Notes (Signed)
Subjective: He is nervous and anxious.  No real change in neck site.  Objective: Vital signs in last 24 hours: Temp:  [97.6 F (36.4 C)-98.9 F (37.2 C)] 98.5 F (36.9 C) (10/30 0447) Pulse Rate:  [73-91] 76 (10/30 0447) Resp:  [18-20] 18 (10/30 0447) BP: (130-148)/(71-85) 135/78 mmHg (10/30 0447) SpO2:  [99 %-100 %] 100 % (10/30 0447) Weight:  [114.306 kg (252 lb)] 114.306 kg (252 lb) (10/29 1522) Last BM Date: 09/21/14 NPO Afebrile, VSS Labs pending  Intake/Output from previous day: 10/29 0701 - 10/30 0700 In: 290 [P.O.:240; IV Piggyback:50] Out: 400 [Urine:400] Intake/Output this shift:    General appearance: alert, cooperative, no distress and anxious Skin: Neck mass/abscess is unchanged.  Lab Results:   Recent Labs  09/21/14 1202  WBC 12.1*  HGB 16.4  HCT 48.4  PLT 277    BMET  Recent Labs  09/21/14 1202  NA 142  K 4.0  CL 103  CO2 25  GLUCOSE 98  BUN 10  CREATININE 1.03  CALCIUM 9.5   PT/INR No results found for this basename: LABPROT, INR,  in the last 72 hours  No results found for this basename: AST, ALT, ALKPHOS, BILITOT, PROT, ALBUMIN,  in the last 168 hours   Lipase  No results found for this basename: lipase     Studies/Results: Dg Chest 2 View  09/21/2014   CLINICAL DATA:  Preop for neck mass.  EXAM: CHEST  2 VIEW  COMPARISON:  12/30/2009  FINDINGS: Normal heart size and mediastinal contours. No acute infiltrate or edema. No effusion or pneumothorax. No acute osseous findings.  IMPRESSION: No active cardiopulmonary disease.   Electronically Signed   By: Jorje Guild M.D.   On: 09/21/2014 23:30   Ct Soft Tissue Neck W Contrast  09/21/2014   CLINICAL DATA:  Palpable mass in the posterior neck.  EXAM: CT NECK WITH CONTRAST  TECHNIQUE: Multidetector CT imaging of the neck was performed using the standard protocol following the bolus administration of intravenous contrast.  CONTRAST:  158mL OMNIPAQUE IOHEXOL 300 MG/ML  SOLN   COMPARISON:  Cervical spine radiographs 03/14/2007  FINDINGS: A peripherally enhancing and partially septated mass lesion is present in the left paramedian and posterior neck. This extends into the fascia and is inseparable from the musculature at the superior aspect of the trapezius. Lesion measures 0 4.0 x 6.7 x 6.7 cm. Inflammatory changes surround lesion with some edema in the subcutaneous soft tissues.  Enlarged posterior level 3 lymph nodes are evident bilaterally, measuring 2.7 x 1.3 cm on the left and 1.5 x 1.0 cm on the right.  The tongue base is within normal limits. No focal mucosal or submucosal lesions are present and spur hyoid neck. The parapharyngeal fat is clear. Mild prominence of the palatine tonsils. The salivary glands are normal.  The larynx is within normal limits. Vocal cords are midline and symmetric. No focal mucosal or submucosal lesions are evident. The cartilages are normal.  The patient is status post left hemi thyroidectomy. The right thyroid lobe is normal. Sub cm superior mediastinal lymph nodes are present.  The bone windows are unremarkable.  IMPRESSION: 1. 6.7 cm irregular peripherally enhancing mass lesion, likely is cystic centrally and probably necrotic. There is surrounding inflammatory change with invasion into the subjacent musculature. While this could certainly be infectious, the invasive component raises concern for neoplasm. 2. Enlarged posterior level 3 lymph nodes bilaterally, left greater than right could represent metastatic disease. 3. Status post left hemithyroidectomy.  4. Sub cm superior mediastinal lymph nodes.   Electronically Signed   By: Lawrence Santiago M.D.   On: 09/21/2014 13:23    Medications: . chlorhexidine  1 application Topical Once  . chlorhexidine  1 application Topical Once  . heparin  5,000 Units Subcutaneous 3 times per day  . piperacillin-tazobactam (ZOSYN)  IV  3.375 g Intravenous 3 times per day  . vancomycin  1,000 mg Intravenous Q12H     Assessment/Plan 1. Posterior cystic neck mass, with 2 I&D procedures since 09/14/14 that has not improved. It has a rim enhancing fluid collection on CT and Dr. Zella Richer thinks it's a deep abscess.  2. 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  3. History of pilonidal cyst in the past.    Plan:  OR today      LOS: 1 day    Matthew Black 09/22/2014

## 2014-09-22 NOTE — Transfer of Care (Signed)
Immediate Anesthesia Transfer of Care Note  Patient: Matthew Black  Procedure(s) Performed: Procedure(s): INCISION AND DRAINAGE ABSCESS POSTERIOR NECK (N/A)  Patient Location: PACU  Anesthesia Type:General  Level of Consciousness: awake, sedated and patient cooperative  Airway & Oxygen Therapy: Patient Spontanous Breathing and Patient connected to face mask oxygen  Post-op Assessment: Report given to PACU RN and Post -op Vital signs reviewed and stable  Post vital signs: Reviewed and stable  Complications: No apparent anesthesia complications

## 2014-09-22 NOTE — Anesthesia Preprocedure Evaluation (Signed)
Anesthesia Evaluation  Patient identified by MRN, date of birth, ID band Patient awake    Reviewed: Allergy & Precautions, H&P , NPO status , Patient's Chart, lab work & pertinent test results  Airway Mallampati: II  TM Distance: >3 FB Neck ROM: Full    Dental no notable dental hx.    Pulmonary Current Smoker,  breath sounds clear to auscultation  Pulmonary exam normal       Cardiovascular negative cardio ROS  Rhythm:Regular Rate:Normal     Neuro/Psych negative neurological ROS  negative psych ROS   GI/Hepatic negative GI ROS, Neg liver ROS,   Endo/Other  negative endocrine ROS  Renal/GU negative Renal ROS  negative genitourinary   Musculoskeletal negative musculoskeletal ROS (+)   Abdominal (+) + obese,   Peds negative pediatric ROS (+)  Hematology negative hematology ROS (+)   Anesthesia Other Findings   Reproductive/Obstetrics negative OB ROS                             Anesthesia Physical Anesthesia Plan  ASA: II  Anesthesia Plan: General   Post-op Pain Management:    Induction: Intravenous  Airway Management Planned: Oral ETT  Additional Equipment:   Intra-op Plan:   Post-operative Plan: Extubation in OR  Informed Consent: I have reviewed the patients History and Physical, chart, labs and discussed the procedure including the risks, benefits and alternatives for the proposed anesthesia with the patient or authorized representative who has indicated his/her understanding and acceptance.   Dental advisory given  Plan Discussed with: CRNA  Anesthesia Plan Comments:         Anesthesia Quick Evaluation

## 2014-09-22 NOTE — Anesthesia Postprocedure Evaluation (Signed)
  Anesthesia Post-op Note  Patient: Matthew Black  Procedure(s) Performed: Procedure(s) (LRB): INCISION AND DRAINAGE ABSCESS POSTERIOR NECK (N/A)  Patient Location: PACU  Anesthesia Type: General  Level of Consciousness: awake and alert   Airway and Oxygen Therapy: Patient Spontanous Breathing  Post-op Pain: mild  Post-op Assessment: Post-op Vital signs reviewed, Patient's Cardiovascular Status Stable, Respiratory Function Stable, Patent Airway and No signs of Nausea or vomiting  Last Vitals:  Filed Vitals:   09/22/14 1600  BP: 119/76  Pulse: 67  Temp: 37.2 C  Resp: 14    Post-op Vital Signs: stable   Complications: No apparent anesthesia complications

## 2014-09-22 NOTE — Op Note (Signed)
Surgeon: Kaylyn Lim, MD, FACS  Asst:  none  Anes:  General prone  Procedure: Incision and drainage of large posterior cervical abscess  Diagnosis: Posterior cervical abscess  Complications: none  EBL:   minimal cc  Drains: none  Description of Procedure:  The patient was taken to OR 6 at [facility].  After anesthesia was administered and the patient was prepped a timeout was performed.  The patient was rolled into the prone position.  The patient was prepped with PCMX and draped sterilely.  A previously made incision was identified and entered and copious yellow foul smelling pus drained forth.  This was cultured for aerobes and anaerobes and then the pus was completely evacuated and the cavity evacuated and packed with iodophor gauze.  A sterile gauze pad was applied and the patient was taken to the PACU in stable condition.    The patient tolerated the procedure well and was taken to the PACU in stable condition.     Matt B. Hassell Done, [place], [facility] Surgery, [place]

## 2014-09-23 LAB — CBC
HCT: 44.5 % (ref 39.0–52.0)
Hemoglobin: 14.8 g/dL (ref 13.0–17.0)
MCH: 27.7 pg (ref 26.0–34.0)
MCHC: 33.3 g/dL (ref 30.0–36.0)
MCV: 83.2 fL (ref 78.0–100.0)
Platelets: 249 10*3/uL (ref 150–400)
RBC: 5.35 MIL/uL (ref 4.22–5.81)
RDW: 13.8 % (ref 11.5–15.5)
WBC: 12.7 10*3/uL — ABNORMAL HIGH (ref 4.0–10.5)

## 2014-09-23 LAB — BASIC METABOLIC PANEL
Anion gap: 14 (ref 5–15)
BUN: 8 mg/dL (ref 6–23)
CALCIUM: 8.9 mg/dL (ref 8.4–10.5)
CO2: 22 mEq/L (ref 19–32)
CREATININE: 1.06 mg/dL (ref 0.50–1.35)
Chloride: 102 mEq/L (ref 96–112)
GFR calc non Af Amer: 89 mL/min — ABNORMAL LOW (ref >90)
Glucose, Bld: 105 mg/dL — ABNORMAL HIGH (ref 70–99)
POTASSIUM: 3.9 meq/L (ref 3.7–5.3)
Sodium: 138 mEq/L (ref 137–147)

## 2014-09-23 MED ORDER — HYDROCODONE-ACETAMINOPHEN 5-325 MG PO TABS: 1.0000 | ORAL_TABLET | ORAL | Status: DC | PRN

## 2014-09-23 MED ORDER — AMOXICILLIN-POT CLAVULANATE 875-125 MG PO TABS: 1.0000 | ORAL_TABLET | Freq: Two times a day (BID) | ORAL | Status: DC

## 2014-09-23 MED ORDER — AMOXICILLIN-POT CLAVULANATE 875-125 MG PO TABS
1.0000 | ORAL_TABLET | Freq: Two times a day (BID) | ORAL | Status: DC
  Filled 2014-09-23 (×2): qty 1

## 2014-09-23 NOTE — Plan of Care (Signed)
Problem: Phase I Progression Outcomes Goal: OOB as tolerated unless otherwise ordered Outcome: Progressing Ambulating in room prior to procedure

## 2014-09-23 NOTE — ED Provider Notes (Signed)
Medical screening examination/treatment/procedure(s) were conducted as a shared visit with non-physician practitioner(s) or resident and myself. I personally evaluated the patient during the encounter and agree with the findings.  I have personally reviewed any xrays and/ or EKG's with the provider and I agree with interpretation.  Patient with recurrent posterior neck abscess worsening swelling and pain for the past 2 weeks. Patient has been evaluated multiple times with 2 I and D's and still worsening. On exam patient has significant swelling mild induration posterior aspect upper cervical region, central area of dried discharge. No surrounding erythema appreciated. Neck supple otherwise, patient grossly has normal strength bilateral, well-appearing. Discussed blood work, CT scan with IV contrast for further delineation to evaluate for extension of abscess. Pt admitted to G surgery for I and D. Cervical neck abscess   Mariea Clonts, MD 09/23/14 1125

## 2014-09-23 NOTE — Discharge Instructions (Signed)
GENERAL SURGERY: POST OP INSTRUCTIONS  1. DIET: Follow a light bland diet the first 24 hours after arrival home, such as soup, liquids, crackers, etc.  Be sure to include lots of fluids daily.  Avoid fast food or heavy meals as your are more likely to get nauseated.   2. Take your usually prescribed home medications unless otherwise directed. 3. PAIN CONTROL: a. Pain is best controlled by a usual combination of three different methods TOGETHER: i. Ice/Heat ii. Over the counter pain medication iii. Prescription pain medication b. Most patients will experience some swelling and bruising around the incisions.  Ice packs or heating pads (30-60 minutes up to 6 times a day) will help. Use ice for the first few days to help decrease swelling and bruising, then switch to heat to help relax tight/sore spots and speed recovery.  Some people prefer to use ice alone, heat alone, alternating between ice & heat.  Experiment to what works for you.  Swelling and bruising can take several weeks to resolve.   c. It is helpful to take an over-the-counter pain medication regularly for the first few weeks.  Choose one of the following that works best for you: i. Naproxen (Aleve, etc)  Two 220mg  tabs twice a day ii. Ibuprofen (Advil, etc) Three 200mg  tabs four times a day (every meal & bedtime) d. A  prescription for pain medication (such as Percocet, oxycodone, hydrocodone, etc) should be given to you upon discharge.  Take your pain medication as prescribed.  i. If you are having problems/concerns with the prescription medicine (does not control pain, nausea, vomiting, rash, itching, etc), please call us [phone] to see if we need to switch you to a different pain medicine that will work better for you and/or control your side effect better. ii. If you need a refill on your pain medication, please contact your pharmacy.  They will contact our office to request authorization. Prescriptions will not be filled after 5  pm or on week-ends. 4. Avoid getting constipated.  Between the surgery and the pain medications, it is common to experience some constipation.  Increasing fluid intake and taking a fiber supplement (such as Metamucil, Citrucel, FiberCon, MiraLax, etc) 1-2 times a day regularly will usually help prevent this problem from occurring.  A mild laxative (prune juice, Milk of Magnesia, MiraLax, etc) should be taken according to package directions if there are no bowel movements after 48 hours.   5. Wash / shower every day.   6. Remove 1 inch of packing and trim excess daily until packing falls out.  Cover with a dry dressing.     7. ACTIVITIES as tolerated:   a. You may resume regular (light) daily activities beginning the next day--such as daily self-care, walking, climbing stairs--gradually increasing activities as tolerated.  If you can walk 30 minutes without difficulty, it is safe to try more intense activity such as jogging, treadmill, bicycling, low-impact aerobics, swimming, etc. b. Save the most intensive and strenuous activity for last such as sit-ups, heavy lifting, contact sports, etc  Refrain from any heavy lifting or straining until you are off narcotics for pain control.   c. DO NOT PUSH THROUGH PAIN.  Let pain be your guide: If it hurts to do something, don't do it.  Pain is your body warning you to avoid that activity for another week until the pain goes down. d. You may drive when you are no longer taking prescription pain medication, you can comfortably wear a  seatbelt, and you can safely maneuver your car and apply brakes. e. Dennis Bast may have sexual intercourse when it is comfortable.  8. FOLLOW UP in our office a. Please call [facility] at [phone] to set up an appointment to see your surgeon in the office for a follow-up appointment approximately 2-3 weeks after your surgery. b. Make sure that you call for this appointment the day you arrive home to insure a convenient appointment time. 9. IF  YOU HAVE DISABILITY OR FAMILY LEAVE FORMS, BRING THEM TO THE OFFICE FOR PROCESSING.  DO NOT GIVE THEM TO YOUR DOCTOR.   WHEN TO CALL us [phone]: 1. Poor pain control 2. Reactions / problems with new medications (rash/itching, nausea, etc)  3. Fever over 101.5 F (38.5 C) 4. Worsening swelling or bruising 5. Continued bleeding from incision. 6. Increased pain, redness, or drainage from the incision   The clinic staff is available to answer your questions during regular business hours (8:30am-5pm).  Please dont hesitate to call and ask to speak to one of our nurses for clinical concerns.   If you have a medical emergency, go to the nearest emergency room or call 911.  A surgeon from [facility] is always on call at the [facility], [place], [place], [place], [place]  [address] ? MAIN: [phone] ? TOLL FREE: [phone] ?  FAX [phone] [email or web address]

## 2014-09-23 NOTE — Discharge Summary (Signed)
Reviewed discharge instructions with pt including medications, precautions, wound care, and follow up appointment.  Pt able to recite MD's instructions for dressing change and wound care without prompting.  Planned to review with spouse, but she was unable to come to pt's room.  Sent pt home with wound care supplies.  Pt verbalized good understanding of all topics discussed.

## 2014-09-23 NOTE — Discharge Summary (Signed)
Physician Discharge Summary  Patient ID: Matthew Black MRN: [id number] DOB/AGE: 1975-12-02 38 y.o.  Admit date: 09/21/2014 Discharge date: 09/23/2014  Admission Diagnoses: neck abscess  Discharge Diagnoses:  Neck abscess  Discharged Condition: good  Hospital Course: Pt underwent drainage of a large posterior neck abscess.  He felt much better after surgery.  He was ready for d/c the following day.    Consults: None  Significant Diagnostic Studies: labs: cbc, chemistry  Treatments: antibiotics: Augmentin and surgery: I&D  Discharge Exam: Blood pressure 117/70, pulse 72, temperature 99.3 F (37.4 C), temperature source Oral, resp. rate 16, height 5\' 11"  (1.803 m), weight 252 lb (114.306 kg), SpO2 99.00%. General appearance: alert and cooperative Incision/Wound: clean, packing removed a bit  Disposition: 01-Home or Self Care     Medication List         amoxicillin-clavulanate 875-125 MG per tablet  Commonly known as:  AUGMENTIN  Take 1 tablet by mouth 2 (two) times daily.     HYDROcodone-acetaminophen 5-325 MG per tablet  Commonly known as:  NORCO/VICODIN  Take 1-2 tablets by mouth every 4 (four) hours as needed for moderate pain or severe pain.     ibuprofen 200 MG tablet  Commonly known as:  ADVIL,MOTRIN  Take 800 mg by mouth every 6 (six) hours as needed for moderate pain.     oxyCODONE-acetaminophen 5-325 MG per tablet  Commonly known as:  PERCOCET/ROXICET  Take 1-2 tablets by mouth every 6 (six) hours as needed for severe pain.       Follow-up Information   Follow up with Johnathan Hausen B, MD. Schedule an appointment as soon as possible for a visit in 2 weeks.   Specialty:  General Surgery   Contact information:   [address] [place] [address] [phone]       Signed: Rosario Adie 44/97/5300, 11:51 AM

## 2014-09-24 LAB — WOUND CULTURE: Gram Stain: NONE SEEN

## 2014-09-25 ENCOUNTER — Encounter (HOSPITAL_COMMUNITY): Payer: Self-pay | Admitting: Surgery

## 2014-09-25 LAB — CULTURE, ROUTINE-ABSCESS: Culture: NO GROWTH

## 2014-09-27 LAB — ANAEROBIC CULTURE

## 2014-11-05 LAB — AFB CULTURE WITH SMEAR (NOT AT ARMC): Acid Fast Smear: NONE SEEN

## 2015-10-22 ENCOUNTER — Encounter (HOSPITAL_COMMUNITY): Payer: Self-pay | Admitting: Emergency Medicine

## 2015-10-22 ENCOUNTER — Emergency Department (HOSPITAL_COMMUNITY)
Admission: EM | Admit: 2015-10-22 | Discharge: 2015-10-23 | Disposition: A | Discharge status: Home / Self Care | Payer: 59 | Attending: Emergency Medicine | Admitting: Emergency Medicine

## 2015-10-22 DIAGNOSIS — F17210 Nicotine dependence, cigarettes, uncomplicated: Secondary | ICD-10-CM | POA: Insufficient documentation

## 2015-10-22 DIAGNOSIS — D179 Benign lipomatous neoplasm, unspecified: Secondary | ICD-10-CM

## 2015-10-22 DIAGNOSIS — D170 Benign lipomatous neoplasm of skin and subcutaneous tissue of head, face and neck: Secondary | ICD-10-CM | POA: Insufficient documentation

## 2015-10-22 NOTE — ED Notes (Signed)
Patient presents for recurring abscess to posterior neck, most recently x1 week. Rates pain 5/10, denies fever/chills, denies N/V. No drainage, not tender upon palpation.

## 2015-10-23 NOTE — ED Provider Notes (Signed)
CSN: [id number]     Arrival date & time 10/22/15  2155 History  By signing my name below, I, Irene Pap, attest that this documentation has been prepared under the direction and in the presence of Everlene Balls, MD. Electronically Signed: Irene Pap, ED Scribe. 10/23/2015. 12:56 AM.  Chief Complaint  Patient presents with  . Abscess   The history is provided by the patient. No language interpreter was used.  HPI Comments: Matthew Black is a 39 y.o. Male who presents to the Emergency Department complaining of recurrent posterior neck abscess onset one week ago. Pt rates pain 5/10. He states that he last had the area drained one year ago for the first time after a haircut and has been coming and going ever since. He denies fever, chills, nausea, vomiting, or drainage from the area.   Past Medical History  Diagnosis Date  . Medical history non-contributory    Past Surgical History  Procedure Laterality Date  . No past surgeries    . Incision and drainage abscess N/A 09/22/2014    Procedure: INCISION AND DRAINAGE ABSCESS POSTERIOR NECK;  Surgeon: Pedro Earls, MD;  Location: [facility];  Service: General;  Laterality: N/A;   Family History  Problem Relation Age of Onset  . Diabetes Other    Social History  Substance Use Topics  . Smoking status: Current Every Day Smoker -- 0.25 packs/day for 7 years    Types: Cigarettes  . Smokeless tobacco: Never Used  . Alcohol Use: No    Review of Systems 10 Systems reviewed and all are negative for acute change except as noted in the HPI.  Allergies  Review of patient's allergies indicates no known allergies.  Home Medications   Prior to Admission medications   Medication Sig Start Date End Date Taking? Authorizing Provider  ibuprofen (ADVIL,MOTRIN) 200 MG tablet Take 800 mg by mouth every 6 (six) hours as needed for moderate pain.   Yes Historical Provider, MD  amoxicillin-clavulanate (AUGMENTIN) 875-125 MG per tablet Take 1  tablet by mouth 2 (two) times daily. Patient not taking: Reported on Q000111Q Q000111Q   Leighton Ruff, MD  HYDROcodone-acetaminophen (NORCO/VICODIN) 5-325 MG per tablet Take 1-2 tablets by mouth every 4 (four) hours as needed for moderate pain or severe pain. Patient not taking: Reported on Q000111Q Q000111Q   Leighton Ruff, MD  oxyCODONE-acetaminophen (PERCOCET/ROXICET) 5-325 MG per tablet Take 1-2 tablets by mouth every 6 (six) hours as needed for severe pain. Patient not taking: Reported on 10/22/2015 09/19/14   [name], PA-C   BP 117/81 mmHg  Pulse 86  Temp(Src) 98.2 F (36.8 C) (Oral)  Resp 20  SpO2 94% Physical Exam  Constitutional: He is oriented to person, place, and time. Vital signs are normal. He appears well-developed and well-nourished.  Non-toxic appearance. He does not appear ill. No distress.  HENT:  Head: Normocephalic and atraumatic.  Nose: Nose normal.  Mouth/Throat: Oropharynx is clear and moist. No oropharyngeal exudate.  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 Pupils are equal, round, and reactive to light. No scleral icterus.  Neck: Normal range of motion. Neck supple. No tracheal deviation, no edema, no erythema and normal range of motion present. No thyroid mass and no thyromegaly present.  Cardiovascular: Normal rate, regular rhythm, S1 normal, S2 normal, normal heart sounds, intact distal pulses and normal pulses.  Exam reveals no gallop and no friction rub.   No murmur  heard. Pulmonary/Chest: Effort normal and breath sounds normal. No respiratory distress. He has no wheezes. He has no rhonchi. He has no rales.  Abdominal: Soft. Normal appearance and bowel sounds are normal. He exhibits no distension, no ascites and no mass. There is no hepatosplenomegaly. There is no tenderness. There is no rebound, no guarding and no CVA tenderness.  Musculoskeletal: Normal range of motion. He  exhibits no edema or tenderness.  Lymphadenopathy:    He has no cervical adenopathy.  Neurological: He is alert and oriented to person, place, and time. He has normal strength. No cranial nerve deficit or sensory deficit.  Skin: Skin is warm, dry and intact. No petechiae and no rash noted. He is not diaphoretic. No erythema. No pallor.  Psychiatric: He has a normal mood and affect. His behavior is normal. Judgment normal.  Nursing note and vitals reviewed.   ED Course  Procedures (including critical care time) DIAGNOSTIC STUDIES: Oxygen Saturation is 94% on RA, adequate by my interpretation.    COORDINATION OF CARE: 12:54 AM-Discussed treatment plan which includes referral to specialist with pt at bedside and pt agreed to plan.   Labs Review Labs Reviewed - No data to display  Imaging Review No results found. I have personally reviewed and evaluated these images and lab results as part of my medical decision-making.   EKG Interpretation None      MDM   Final diagnoses:  None   Patient presents to the ED for evaluation of his neck mass.  Physical exam reveals lipoma and is not consistent with abscess.  He states it has gone up and down over the last year which is also no consistent with abscess.  Education and surgery follow up provided.  He appears well and in NAD.  VS remain within his normal limits and he is safe for Dc.   I personally performed the services described in this documentation, which was scribed in my presence. The recorded information has been reviewed and is accurate.     Everlene Balls, MD 10/23/15 [phone]

## 2015-10-23 NOTE — Discharge Instructions (Signed)
Lipoma Mr. Prendes, your exam shows a lipoma.  See general surgery within 3 days for close follow up.  If symptoms worsen, come back to the ED immediately.  Thank you. A lipoma is a noncancerous (benign) tumor that is made up of fat cells. This is a very common type of soft-tissue growth. Lipomas are usually found under the skin (subcutaneous). They may occur in any tissue of the body that contains fat. Common areas for lipomas to appear include the back, shoulders, buttocks, and thighs. Lipomas grow slowly, and they are usually painless. Most lipomas do not cause problems and do not require treatment. CAUSES The cause of this condition is not known. RISK FACTORS This condition is more likely to develop in:  People who are 77-71 years old.  People who have a family history of lipomas. SYMPTOMS A lipoma usually appears as a small, round bump under the skin. It may feel soft or rubbery, but the firmness can vary. Most lipomas are not painful. However, a lipoma may become painful if it is located in an area where it pushes on nerves. DIAGNOSIS A lipoma can usually be diagnosed with a physical exam. You may also have tests to confirm the diagnosis and to rule out other conditions. Tests may include:  Imaging tests, such as a CT scan or MRI.  Removal of a tissue sample to be looked at under a microscope (biopsy). TREATMENT Treatment is not needed for small lipomas that are not causing problems. If a lipoma continues to get bigger or it causes problems, removal is often the best option. Lipomas can also be removed to improve appearance. Removal of a lipoma is usually done with a surgery in which the fatty cells and the surrounding capsule are removed. Most often, a medicine that numbs the area (local anesthetic) is used for this procedure. HOME CARE INSTRUCTIONS  Keep all follow-up visits as directed by your health care provider. This is important. SEEK MEDICAL CARE IF:  Your lipoma becomes larger  or hard.  Your lipoma becomes painful, red, or increasingly swollen. These could be signs of infection or a more serious condition.   This information is not intended to replace advice given to you by your health care provider. Make sure you discuss any questions you have with your health care provider.   Document Released: 10/31/2002 Document Revised: 03/27/2015 Document Reviewed: 11/06/2014 Elsevier Interactive Patient Education [employer].

## 2015-10-26 ENCOUNTER — Emergency Department (HOSPITAL_COMMUNITY)
Admission: EM | Admit: 2015-10-26 | Discharge: 2015-10-26 | Disposition: A | Discharge status: Home / Self Care | Payer: 59 | Attending: Emergency Medicine | Admitting: Emergency Medicine

## 2015-10-26 DIAGNOSIS — F17210 Nicotine dependence, cigarettes, uncomplicated: Secondary | ICD-10-CM | POA: Insufficient documentation

## 2015-10-26 DIAGNOSIS — R221 Localized swelling, mass and lump, neck: Secondary | ICD-10-CM | POA: Diagnosis not present

## 2015-10-26 MED ORDER — LIDOCAINE-EPINEPHRINE (PF) 2 %-1:200000 IJ SOLN
20.0000 mL | Freq: Once | INTRAMUSCULAR | Status: AC
  Administered 2015-10-26: 20 mL
  Filled 2015-10-26: qty 20

## 2015-10-26 NOTE — ED Notes (Signed)
Pt arrived to the ED with a complaint of a neck abscess.  Pt had this abscess drained a year ago and removed the packing himself.  Pt states abscess began returning a month ago.  Pt is concerned that he didn't get all the packing out.

## 2015-10-26 NOTE — Discharge Instructions (Signed)
CONTINUE IBUPROFEN AND/OR TYLENOL FOR PAIN RELIEF. CONTINUE WARM COMPRESSES TO THE POSTERIOR NECK FOR COMFORT. FOLLOW UP WITH [facility] AS PREVIOUSLY REFERRED FOR DEFINITIVE TREATMENT OF NECK CYST.

## 2015-10-26 NOTE — ED Provider Notes (Signed)
CSN: [id number]     Arrival date & time 10/26/15  2112 History  By signing my name below, I, Emmanuella Mensah, attest that this documentation has been prepared under the direction and in the presence of Charlann Lange, PA-C. Electronically Signed: Judithann Sauger, ED Scribe. 10/26/2015. 9:51 PM.    Chief Complaint  Patient presents with  . Abscess   The history is provided by the patient. No language interpreter was used.   HPI Comments: Matthew Black is a 39 y.o. male who presents to the Emergency Department complaining of a non-draining gradually worsening painful swollen area on his posterior neck onset one month ago. He denies any fever, chills, or n/v. He reports that he had the same area drained last year but the area has swollen again. He adds that the area is now bigger than it was when he first had it drained last year. Pt was seen here on 10/22/15 and diagnosed with lipoma. Pt states that he is here because the area is worsening and he wants to make sure that the area is not an abscess.    Past Medical History  Diagnosis Date  . Medical history non-contributory    Past Surgical History  Procedure Laterality Date  . No past surgeries    . Incision and drainage abscess N/A 09/22/2014    Procedure: INCISION AND DRAINAGE ABSCESS POSTERIOR NECK;  Surgeon: Pedro Earls, MD;  Location: [facility];  Service: General;  Laterality: N/A;   Family History  Problem Relation Age of Onset  . Diabetes Other    Social History  Substance Use Topics  . Smoking status: Current Every Day Smoker -- 0.25 packs/day for 7 years    Types: Cigarettes  . Smokeless tobacco: Never Used  . Alcohol Use: No    Review of Systems  Constitutional: Negative for fever.  Gastrointestinal: Negative for nausea and vomiting.  Skin: Negative for wound.       Area of swelling on posterior neck      Allergies  Review of patient's allergies indicates no known allergies.  Home Medications   Prior to  Admission medications   Medication Sig Start Date End Date Taking? Authorizing Provider  amoxicillin-clavulanate (AUGMENTIN) 875-125 MG per tablet Take 1 tablet by mouth 2 (two) times daily. Patient not taking: Reported on Q000111Q Q000111Q   Leighton Ruff, MD  HYDROcodone-acetaminophen (NORCO/VICODIN) 5-325 MG per tablet Take 1-2 tablets by mouth every 4 (four) hours as needed for moderate pain or severe pain. Patient not taking: Reported on Q000111Q Q000111Q   Leighton Ruff, MD  ibuprofen (ADVIL,MOTRIN) 200 MG tablet Take 800 mg by mouth every 6 (six) hours as needed for moderate pain.    Historical Provider, MD  oxyCODONE-acetaminophen (PERCOCET/ROXICET) 5-325 MG per tablet Take 1-2 tablets by mouth every 6 (six) hours as needed for severe pain. Patient not taking: Reported on 10/22/2015 09/19/14   [name], PA-C   BP 132/91 mmHg  Pulse 85  Temp(Src)   Resp 20  SpO2 100% Physical Exam  Constitutional: He is oriented to person, place, and time. He appears well-developed and well-nourished. No distress.  HENT:  Head: Normocephalic and atraumatic.  Large, firm, tender cyst measuring approximately 6 cm in length, 4 cm in width to posterior neck. No redness. No fluctuance.   Eyes: Conjunctivae and EOM are normal.  Neck: Neck supple. No tracheal deviation present.  Cardiovascular: Normal rate.   Pulmonary/Chest: Effort normal. No respiratory distress.  Musculoskeletal: Normal range of motion.  Neurological: He is alert and oriented to person, place, and time.  Skin: Skin is warm and dry.  Psychiatric: He has a normal mood and affect. His behavior is normal.  Nursing note and vitals reviewed.   ED Course  Procedures (including critical care time) DIAGNOSTIC STUDIES: Oxygen Saturation is 100% on RA, normal by my interpretation.    COORDINATION OF CARE: 9:44 PM- Pt advised of plan for treatment and pt agrees. Will consult with attending for further evaluation of the area.   INCISION AND DRAINAGE Performed by: Charlann Lange A Consent: Verbal consent obtained. Risks and benefits: risks, benefits and alternatives were discussed Type: abscess  Body area: posterior neck  Anesthesia: local infiltration  Incision was made with a scalpel.  Local anesthetic: lidocaine 2% w/epinephrine  Anesthetic total: 2 ml  Complexity: complex Blunt dissection to break up loculations  Drainage: purulent  Drainage amount: none  Packing material: none  Patient tolerance: Patient tolerated the procedure well with no immediate complications.    MDM   Final diagnoses:  None    1. Posterior neck mass  Attempted I&D without successful drainage of cyst suggesting other etiology. He has referral to surgery from previous ER evaluation and he is encouraged to follow up with [facility] for further excision of cystic structure.   I personally performed the services described in this documentation, which was scribed in my presence. The recorded information has been reviewed and is accurate.     Charlann Lange, PA-C 10/26/15 [place], MD 10/26/15 2329

## 2015-10-29 ENCOUNTER — Emergency Department (HOSPITAL_COMMUNITY): Payer: 59

## 2015-10-29 ENCOUNTER — Emergency Department (HOSPITAL_COMMUNITY)
Admission: EM | Admit: 2015-10-29 | Discharge: 2015-10-30 | Disposition: A | Discharge status: Home / Self Care | Payer: 59 | Attending: Emergency Medicine | Admitting: Emergency Medicine

## 2015-10-29 ENCOUNTER — Encounter (HOSPITAL_COMMUNITY): Payer: Self-pay | Admitting: *Deleted

## 2015-10-29 DIAGNOSIS — L0211 Cutaneous abscess of neck: Secondary | ICD-10-CM | POA: Diagnosis not present

## 2015-10-29 DIAGNOSIS — R221 Localized swelling, mass and lump, neck: Secondary | ICD-10-CM | POA: Diagnosis present

## 2015-10-29 DIAGNOSIS — D72829 Elevated white blood cell count, unspecified: Secondary | ICD-10-CM | POA: Diagnosis not present

## 2015-10-29 DIAGNOSIS — F17210 Nicotine dependence, cigarettes, uncomplicated: Secondary | ICD-10-CM | POA: Insufficient documentation

## 2015-10-29 DIAGNOSIS — R6883 Chills (without fever): Secondary | ICD-10-CM

## 2015-10-29 LAB — BASIC METABOLIC PANEL
Anion gap: 9 (ref 5–15)
BUN: 12 mg/dL (ref 6–20)
CO2: 23 mmol/L (ref 22–32)
CREATININE: 1.15 mg/dL (ref 0.61–1.24)
Calcium: 9.1 mg/dL (ref 8.9–10.3)
Chloride: 108 mmol/L (ref 101–111)
GFR calc Af Amer: >60 mL/min (ref >60)
Glucose, Bld: 130 mg/dL — ABNORMAL HIGH (ref 65–99)
Potassium: 3.7 mmol/L (ref 3.5–5.1)
SODIUM: 140 mmol/L (ref 135–145)

## 2015-10-29 LAB — CBC WITH DIFFERENTIAL/PLATELET
Basophils Absolute: 0 10*3/uL (ref 0.0–0.1)
Basophils Relative: 0 %
EOS ABS: 0.1 10*3/uL (ref 0.0–0.7)
EOS PCT: 1 %
HCT: 48.8 % (ref 39.0–52.0)
Hemoglobin: 16.8 g/dL (ref 13.0–17.0)
LYMPHS ABS: 3.6 10*3/uL (ref 0.7–4.0)
Lymphocytes Relative: 30 %
MCH: 28.8 pg (ref 26.0–34.0)
MCHC: 34.4 g/dL (ref 30.0–36.0)
MCV: 83.7 fL (ref 78.0–100.0)
MONO ABS: 1.1 10*3/uL — AB (ref 0.1–1.0)
MONOS PCT: 9 %
Neutro Abs: 7.3 10*3/uL (ref 1.7–7.7)
Neutrophils Relative %: 60 %
PLATELETS: 216 10*3/uL (ref 150–400)
RBC: 5.83 MIL/uL — ABNORMAL HIGH (ref 4.22–5.81)
RDW: 14.2 % (ref 11.5–15.5)
WBC: 12.1 10*3/uL — AB (ref 4.0–10.5)

## 2015-10-29 MED ORDER — LIDOCAINE-EPINEPHRINE (PF) 2 %-1:200000 IJ SOLN
INTRAMUSCULAR | Status: AC
  Filled 2015-10-29: qty 20

## 2015-10-29 MED ORDER — DOXYCYCLINE HYCLATE 100 MG PO CAPS: 100.0000 mg | ORAL_CAPSULE | Freq: Two times a day (BID) | ORAL | Status: DC

## 2015-10-29 MED ORDER — MORPHINE SULFATE (PF) 4 MG/ML IV SOLN
4.0000 mg | Freq: Once | INTRAVENOUS | Status: AC
  Administered 2015-10-29: 4 mg via INTRAVENOUS
  Filled 2015-10-29: qty 1

## 2015-10-29 MED ORDER — HYDROCODONE-ACETAMINOPHEN 5-325 MG PO TABS: 1.0000 | ORAL_TABLET | Freq: Four times a day (QID) | ORAL | Status: DC | PRN

## 2015-10-29 MED ORDER — NAPROXEN 500 MG PO TABS: 500.0000 mg | ORAL_TABLET | Freq: Two times a day (BID) | ORAL | Status: DC | PRN

## 2015-10-29 MED ORDER — IOHEXOL 300 MG/ML  SOLN
75.0000 mL | Freq: Once | INTRAMUSCULAR | Status: AC | PRN
  Administered 2015-10-29: 75 mL via INTRAVENOUS

## 2015-10-29 MED ORDER — LIDOCAINE-EPINEPHRINE (PF) 2 %-1:200000 IJ SOLN
10.0000 mL | Freq: Once | INTRAMUSCULAR | Status: AC
  Administered 2015-10-29: 10 mL

## 2015-10-29 MED ORDER — VANCOMYCIN HCL IN DEXTROSE 1-5 GM/200ML-% IV SOLN
1000.0000 mg | Freq: Once | INTRAVENOUS | Status: AC
  Administered 2015-10-29: 1000 mg via INTRAVENOUS
  Filled 2015-10-29: qty 200

## 2015-10-29 NOTE — ED Provider Notes (Signed)
CSN: [id number]     Arrival date & time 10/29/15  2019 History   First MD Initiated Contact with Patient 10/29/15 2045     Chief Complaint  Patient presents with  . Cyst to Neck      (Consider location/radiation/quality/duration/timing/severity/associated sxs/prior Treatment) HPI Comments: Matthew Black is a 39 y.o. male with a PMHx of cyst to neck with prior I&D on 09/22/14, who presents to the ED with complaints of one month of gradually worsening enlarging mass to the posterior neck. Patient has had a cyst in the past which required I&D, and states that over the last 1 month the area has gradually increased in size, become more painful, more indurated, and gradually has had more warmth in the area. He describes the pain is currently 10/10 constant pressure and throbbing pain in the posterior neck, nonradiating, worse with palpation movement, and unrelieved with warm compresses and ibuprofen. Associated symptoms include increased swelling, increased warmth, and chills. He called Dr. Cristal Generous office today and was told that his insurance isn't covered at that office, but that he would need to go to the ER to get a CT scan of this area. Chart review reveals he was seen 3 days ago for this mass, and had an unsuccessful IND performed, was not discharged home with any antibiotics. He states that since then the swelling and pain become worse.  He denies any fevers, drainage, erythema, red streaking, chest pain, shortness breath, abdominal pain, nausea, vomiting, diarrhea, constipation, dysuria, hematuria, numbness, tingling, or weakness.  Patient is a 39 y.o. male presenting with abscess. The history is provided by the patient and medical records. No language interpreter was used.  Abscess Location:  Head/neck Head/neck abscess location:  L neck and R neck Abscess quality: induration, painful and warmth   Abscess quality: not draining, no fluctuance and no redness   Red streaking: no   Duration:   1 month Progression:  Worsening Pain details:    Quality:  Pressure and throbbing   Severity:  Severe   Duration:  1 month   Timing:  Constant   Progression:  Worsening Chronicity:  Recurrent Context: not insect bite/sting and not skin injury   Relieved by:  Nothing Exacerbated by: movement, palpation. Ineffective treatments:  NSAIDs, draining/squeezing and warm compresses Associated symptoms: no fever, no nausea and no vomiting   Risk factors: prior abscess     Past Medical History  Diagnosis Date  . Medical history non-contributory    Past Surgical History  Procedure Laterality Date  . No past surgeries    . Incision and drainage abscess N/A 09/22/2014    Procedure: INCISION AND DRAINAGE ABSCESS POSTERIOR NECK;  Surgeon: Pedro Earls, MD;  Location: [facility];  Service: General;  Laterality: N/A;   Family History  Problem Relation Age of Onset  . Diabetes Other    Social History  Substance Use Topics  . Smoking status: Current Every Day Smoker -- 0.25 packs/day for 7 years    Types: Cigarettes  . Smokeless tobacco: Never Used  . Alcohol Use: No    Review of Systems  Constitutional: Positive for chills. Negative for fever.  Respiratory: Negative for shortness of breath.   Cardiovascular: Negative for chest pain.  Gastrointestinal: Negative for nausea, vomiting, abdominal pain, diarrhea and constipation.  Genitourinary: Negative for dysuria and hematuria.  Musculoskeletal: Positive for neck pain (posterior neck mass). Negative for myalgias and arthralgias.  Skin: Positive for wound (I&D 3 days ago on posterior neck mass).  Negative for color change.  Allergic/Immunologic: Negative for immunocompromised state.  Neurological: Negative for weakness and numbness.  Psychiatric/Behavioral: Negative for confusion.   10 Systems reviewed and are negative for acute change except as noted in the HPI.    Allergies  Review of patient's allergies indicates no known  allergies.  Home Medications   Prior to Admission medications   Medication Sig Start Date End Date Taking? Authorizing Provider  OVER THE COUNTER MEDICATION Take 1 capsule by mouth 2 (two) times daily. Herbal Life Cell-U-Loss    Historical Provider, MD  OVER THE COUNTER MEDICATION Take 1 capsule by mouth 2 (two) times daily. Herbal Life Multivitamin Complex    Historical Provider, MD  OVER THE COUNTER MEDICATION Take 1 capsule by mouth 2 (two) times daily. Herbal Life Total Control    Historical Provider, MD   BP 137/86 mmHg  Pulse 98  Temp(Src) 98.2 F (36.8 C) (Oral)  Resp 18  SpO2 97% Physical Exam  Constitutional: He is oriented to person, place, and time. Vital signs are normal. He appears well-developed and well-nourished.  Non-toxic appearance. No distress.  Afebrile, nontoxic, NAD  HENT:  Head: Normocephalic and atraumatic.  Mouth/Throat: Mucous membranes are normal.  Eyes: Conjunctivae and EOM are normal. Right eye exhibits no discharge. Left eye exhibits no discharge.  Neck: Normal range of motion. Neck supple. No muscular tenderness present. No rigidity. Edema and erythema present. Normal range of motion present.    FROM intact with large 10 x 7 cm indurated mildly erythematous tender mass to the posterior neck, no fluctuance, with small I&D incision centrally with no drainage, no red streaking, mildly warm to the touch, no paraspinous muscle TTP or muscle spasms. No rigidity or meningeal signs. No bruising.  Cardiovascular: Normal rate and intact distal pulses.   Pulmonary/Chest: Effort normal. No respiratory distress.  Abdominal: Normal appearance. He exhibits no distension.  Musculoskeletal: Normal range of motion.  MAE x4 Strength and sensation grossly intact Distal pulses intact Gait steady  Neurological: He is alert and oriented to person, place, and time. He has normal strength. No sensory deficit.  Skin: Skin is warm, dry and intact. No rash noted. There is  erythema.  Large mass to posterior neck, as noted above  Psychiatric: He has a normal mood and affect.  Nursing note and vitals reviewed.   ED Course  .Marland KitchenIncision and Drainage Date/Time: 10/29/2015 11:29 PM Performed by: Shann Medal Aubrei Bouchie Authorized by: [facility] Consent: Verbal consent obtained. Risks and benefits: risks, benefits and alternatives were discussed Consent given by: patient Patient understanding: patient states understanding of the procedure being performed Patient consent: the patient's understanding of the procedure matches consent given Patient identity confirmed: verbally with patient Type: abscess Body area: neck Anesthesia: local infiltration Local anesthetic: lidocaine 2% with epinephrine Anesthetic total: 6 ml Patient sedated: no Scalpel size: 11 Needle gauge: 22 Incision type: single straight Incision depth: subcutaneous Complexity: complex (blunt dissection needed) Drainage: purulent Drainage amount: copious Packing material: 1/2 in iodoform gauze Patient tolerance: Patient tolerated the procedure well with no immediate complications   (including critical care time) Labs Review Labs Reviewed  CBC WITH DIFFERENTIAL/PLATELET - Abnormal; Notable for the following:    WBC 12.1 (*)    RBC 5.83 (*)    Monocytes Absolute 1.1 (*)    All other components within normal limits  BASIC METABOLIC PANEL - Abnormal; Notable for the following:    Glucose, Bld 130 (*)    All other components within normal limits  Imaging Review Ct Soft Tissue Neck W Contrast  10/29/2015  CLINICAL DATA:  Chronic neck mass, with pain. Remote history of neck abscess. Initial encounter. EXAM: CT NECK WITH CONTRAST TECHNIQUE: Multidetector CT imaging of the neck was performed using the standard protocol following the bolus administration of intravenous contrast. CONTRAST:  58mL OMNIPAQUE IOHEXOL 300 MG/ML  SOLN COMPARISON:  CT of the neck performed 09/21/2014  FINDINGS: There is a large irregular 7.8 x 4.1 x 6.0 cm peripherally enhancing collection of fluid at the left posterior aspect of the neck, at the prior location of abscess. This is compatible with a recurrent abscess. Surrounding soft tissue inflammation is seen. Given the lack of additional lesions over the past year, necrotic malignancy is considered unlikely. Pharynx and larynx: The nasopharynx, oropharynx and hypopharynx are grossly unremarkable. Parapharyngeal fat planes are within normal limits. The valleculae are grossly unremarkable; the piriform sinuses are not well assessed. Salivary glands: The parotid and submandibular glands are grossly unremarkable in appearance. 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 The thyroid gland is grossly unremarkable, with mild nodularity noted at the isthmus. Lymph nodes: A mildly prominent 1.2 cm node is noted at the left side of the neck. Vascular: Visualized vasculature is grossly unremarkable. There is no definite evidence of vascular compromise. Limited intracranial: The visualized portions of the brain are grossly unremarkable. Visualized orbits: Not well characterized on this study. Mastoids and visualized paranasal sinuses: The visualized portions of the paranasal sinuses and mastoid air cells are well-aerated. Skeleton: A large dental caries is noted at the right mandibular molars. No acute osseous abnormalities are seen. Upper chest: The visualized lung apices are grossly clear. IMPRESSION: 1. Large irregular 7.8 x 4.1 x 6.0 cm peripherally enhancing collection of fluid at the left posterior aspect of the neck, at the prior location of abscess. This is compatible with a recurrent abscess. Surrounding soft tissue inflammation seen. Given the presentation and history, necrotic malignancy is considered unlikely. 2. Mildly prominent left-sided cervical node noted. 3. Large dental caries noted at the right mandibular molars. Electronically Signed   By:  Garald Balding M.D.   On: 10/29/2015 22:48   I have personally reviewed and evaluated these images and lab results as part of my medical decision-making.   EKG Interpretation None      MDM   Final diagnoses:  Neck abscess  Leukocytosis  Chills    39 y.o. male here with recurrent posterior neck mass, now 10x57mm in size, growing and becoming more painful. Slight erythema and warmth noted, indurated without fluctuance. I&D attempted 3 days ago, but unsuccessful. At that time, didn't appear to need abx. 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 Hassell Done. He called [facility] and Dr. Cristal Generous office told him his insurance isn't covered, but he would need to go to the ER for a CT scan of this area. I feel this is reasonable in order to fully describe the mass, since it's not fluctuant and don't feel that another I&D would benefit pt at this time. Will check basic labs and give pain meds as well. Will reassess shortly.   11:01 PM CT showing 7.8 x 4.1 x 6cm fluid collection compatible with abscess. Given that there's a large fluid collection on CT, will attempt I&D again. Labs reveal mildly elevated WBC at 12.1, BMP with glucose 130. Will attempt I&D now. Will give vanc IV as well.   11:50 PM I&D successful, had to  cut down approx 0.5-1cm through skin until drainage began but once blunt dissection was done, copious amounts of purulent drainage was expelled. Large volume of purulent drainage drained out, and due to size of abscess cavity, 1/2" iodoform packing was placed. Pt felt much better after I&D, swelling notable diminished. Discussed f/up with urgent care in 2 days for wound recheck and packing removal, and if symptoms worsen to return to ER for surgical consultation since this area likely needs to have surgical evaluation ultimately, likely a cyst that recurrently gets infected. If symptoms don't worsen, discussed that he would need to see  surgery as an outpt. Will send home with pain meds and doxycycline. Warm compresses discussed. I explained the diagnosis and have given explicit precautions to return to the ER including for any other new or worsening symptoms. The patient understands and accepts the medical plan as it's been dictated and I have answered their questions. Discharge instructions concerning home care and prescriptions have been given. The patient is STABLE and is discharged to home in good condition.  BP 111/78 mmHg  Pulse 82  Temp(Src) 98.2 F (36.8 C) (Oral)  Resp 18  SpO2 96%  Meds ordered this encounter  Medications  . morphine 4 MG/ML injection 4 mg    Sig:   . iohexol (OMNIPAQUE) 300 MG/ML solution 75 mL    Sig:   . lidocaine-EPINEPHrine (XYLOCAINE W/EPI) 2 %-1:200000 (PF) injection 10 mL    Sig:   . vancomycin (VANCOCIN) IVPB 1000 mg/200 mL premix    Sig:     Order Specific Question:  Indication:    Answer:  Wound Infection  . lidocaine-EPINEPHrine (XYLOCAINE W/EPI) 2 %-1:200000 (PF) injection    Sig:     Karren Cobble   : cabinet override  . naproxen (NAPROSYN) 500 MG tablet    Sig: Take 1 tablet (500 mg total) by mouth 2 (two) times daily as needed for mild pain, moderate pain or headache (TAKE WITH MEALS.).    Dispense:  20 tablet    Refill:  0    Order Specific Question:  Supervising Provider    Answer:  MILLER, Farid [[id number]]  . HYDROcodone-acetaminophen (NORCO) 5-325 MG tablet    Sig: Take 1 tablet by mouth every 6 (six) hours as needed for severe pain.    Dispense:  15 tablet    Refill:  0    Order Specific Question:  Supervising Provider    Answer:  MILLER, Kacper [[id number]]  . doxycycline (VIBRAMYCIN) 100 MG capsule    Sig: Take 1 capsule (100 mg total) by mouth 2 (two) times daily. One po bid x 7 days    Dispense:  14 capsule    Refill:  0    Order Specific Question:  Supervising Provider    Answer:  ITALO, LAFARY [[id number]]     Debbie Yearick Camprubi-Soms, PA-C 10/29/15 [phone]  Malvin Johns, MD 10/30/15 1504

## 2015-10-29 NOTE — ED Notes (Signed)
Pt states that he has had a "mass" to his neck for approx 30 days; pt states that he had a previous abscess last year and had to have surgery; pt has been seen twice for this "mass" and was referred to the surgeon; pt states that the surgeon advised him that he didn't take his insurance and that he needed to come here for a CT scan for evaluation; pt states that he still has pain and is concerned about an abscess; pt was seen on 12/2 and an I&D was attempted without obtaining any fluid from "mass'

## 2015-10-29 NOTE — ED Notes (Signed)
INITIAL ASSESSMENT COMPLETED. PT C/O SWELLING TO THE POSTERIOR NECK X1 MONTH. DENIES DRAINAGE OR FEVER. AWAITING FURTHER ORDERS.

## 2015-10-29 NOTE — ED Notes (Signed)
Patient transported to CT 

## 2015-10-29 NOTE — Discharge Instructions (Signed)
Keep wound clean and dry. Apply warm compresses to affected area throughout the day. Take antibiotic until it is finished. Take naprosyn and norco as directed, as needed for pain but do not drive or operate machinery with pain medication use. [facility]up [facility] in 2 days for wound recheck and packing removal. Ultimately you need to follow up with the surgeon, try to make an appointment in the next 1 week for ongoing evaluation of the recurrent abscess on your neck. Monitor area for signs of infection to include, but not limited to: increasing pain, spreading redness, worsening drainage/pus, worsening swelling, or fevers. Return to emergency department for emergent changing or worsening symptoms.   Abscess An abscess (boil or furuncle) is an infected area on or under the skin. This area is filled with yellowish-white fluid (pus) and other material (debris). HOME CARE   Only take medicines as told by your doctor.  If you were given antibiotic medicine, take it as directed. Finish the medicine even if you start to feel better.  If gauze is used, follow your doctor's directions for changing the gauze.  To avoid spreading the infection:  Keep your abscess covered with a bandage.  Wash your hands well.  Do not share personal care items, towels, or whirlpools with others.  Avoid skin contact with others.  Keep your skin and clothes clean around the abscess.  Keep all doctor visits as told. GET HELP RIGHT AWAY IF:   You have more pain, puffiness (swelling), or redness in the wound site.  You have more fluid or blood coming from the wound site.  You have muscle aches, chills, or you feel sick.  You have a fever. MAKE SURE YOU:   Understand these instructions.  Will watch your condition.  Will get help right away if you are not doing well or get worse.   This information is not intended to replace advice given to you by your health care provider.  Make sure you discuss any questions you have with your health care provider.   Document Released: 04/28/2008 Document Revised: 05/11/2012 Document Reviewed: 01/24/2012 Elsevier Interactive Patient Education 2016 [employer]  Incision and Drainage Incision and drainage is a procedure in which a sac-like structure (cystic structure) is opened and drained. The area to be drained usually contains material such as pus, fluid, or blood.  LET YOUR CAREGIVER KNOW ABOUT:   Allergies to medicine.  Medicines taken, including vitamins, herbs, eyedrops, over-the-counter medicines, and creams.  Use of steroids (by mouth or creams).  Previous problems with anesthetics or numbing medicines.  History of bleeding problems or blood clots.  Previous surgery.  Other health problems, including diabetes and kidney problems.  Possibility of pregnancy, if this applies. RISKS AND COMPLICATIONS  Pain.  Bleeding.  Scarring.  Infection. BEFORE THE PROCEDURE  You may need to have an ultrasound or other imaging tests to see how large or deep your cystic structure is. Blood tests may also be used to determine if you have an infection or how severe the infection is. You may need to have a tetanus shot. PROCEDURE  The affected area is cleaned with a cleaning fluid. The cyst area will then be numbed with a medicine (local anesthetic). A small incision will be made in the cystic structure. A syringe or catheter may be used to drain the contents of the cystic structure, or the contents may be squeezed out. The area will then be flushed with a cleansing solution.  After cleansing the area, it is often gently packed with a gauze or another wound dressing. Once it is packed, it will be covered with gauze and tape or some other type of wound dressing. AFTER THE PROCEDURE   Often, you will be allowed to go home right after the procedure.  You may be given antibiotic medicine to prevent or heal an infection.  If  the area was packed with gauze or some other wound dressing, you will likely need to come back in 1 to 2 days to get it removed.  The area should heal in about 14 days.   This information is not intended to replace advice given to you by your health care provider. Make sure you discuss any questions you have with your health care provider.   Document Released: 05/06/2001 Document Revised: 05/11/2012 Document Reviewed: 01/05/2012 Elsevier Interactive Patient Education [employer].

## 2015-10-29 NOTE — ED Notes (Signed)
MD at bedside. 

## 2015-11-28 ENCOUNTER — Encounter (HOSPITAL_COMMUNITY): Payer: Self-pay | Admitting: Emergency Medicine

## 2015-11-28 ENCOUNTER — Emergency Department (HOSPITAL_COMMUNITY)
Admission: EM | Admit: 2015-11-28 | Discharge: 2015-11-29 | Disposition: A | Discharge status: Home / Self Care | Payer: BLUE CROSS/BLUE SHIELD | Attending: Emergency Medicine | Admitting: Emergency Medicine

## 2015-11-28 DIAGNOSIS — L0211 Cutaneous abscess of neck: Secondary | ICD-10-CM | POA: Insufficient documentation

## 2015-11-28 DIAGNOSIS — Z79899 Other long term (current) drug therapy: Secondary | ICD-10-CM | POA: Insufficient documentation

## 2015-11-28 DIAGNOSIS — F17210 Nicotine dependence, cigarettes, uncomplicated: Secondary | ICD-10-CM | POA: Diagnosis not present

## 2015-11-28 DIAGNOSIS — L0291 Cutaneous abscess, unspecified: Secondary | ICD-10-CM

## 2015-11-28 MED ORDER — LIDOCAINE HCL (PF) 1 % IJ SOLN
2.0000 mL | Freq: Once | INTRAMUSCULAR | Status: AC
  Administered 2015-11-29: 2 mL
  Filled 2015-11-28: qty 5

## 2015-11-28 NOTE — ED Provider Notes (Addendum)
CSN: [id number]     Arrival date & time 11/28/15  2224 History  By signing my name below, I, Matthew Black, attest that this documentation has been prepared under the direction and in the presence of Matthew Balding, NP. Electronically Signed: Helane Black, ED Scribe. 11/28/2015. 11:59 PM.    Chief Complaint  Patient presents with  . Abscess   HPI Comments: Abscess back of neck had drained 1 month ago now increased in size has appointment with surgery in several days but pain increased  The history is provided by the patient. No language interpreter was used.   HPI Comments: Matthew Black is a 40 y.o. male who presents to the Emergency Department complaining of a recurrent, worsening, painful, baseball-sized abscess to the back of the neck. Pt reports a PMHx of an abscess in the same location 1 month ago, that was drained on 12/5 in the ED. He reports the packing feel out on its own. However, since then he has noticed a small mass that has progressively grown in size and become increasingly painful in the same location. He notes this feels like his previous abscess. He reports he has an appointment on 1/25 with [facility] for this, but states that the pain is now unbearable, which is why he came to the ED. Pt denies fever. Pt has NKDA.   Past Medical History  Diagnosis Date  . Medical history non-contributory    Past Surgical History  Procedure Laterality Date  . No past surgeries    . Incision and drainage abscess N/A 09/22/2014    Procedure: INCISION AND DRAINAGE ABSCESS POSTERIOR NECK;  Surgeon: Pedro Earls, MD;  Location: [facility];  Service: General;  Laterality: N/A;   Family History  Problem Relation Age of Onset  . Diabetes Other    Social History  Substance Use Topics  . Smoking status: Current Every Day Smoker -- 0.25 packs/day for 7 years    Types: Cigarettes  . Smokeless tobacco: Never Used  . Alcohol Use: No    Review of Systems  Constitutional:  Negative for fever.  Musculoskeletal: Positive for myalgias.  Skin: Positive for color change.    Allergies  Review of patient's allergies indicates no known allergies.  Home Medications   Prior to Admission medications   Medication Sig Start Date End Date Taking? Authorizing Provider  ibuprofen (ADVIL,MOTRIN) 200 MG tablet Take 400 mg by mouth every 6 (six) hours as needed (for pain.).   Yes Historical Provider, MD  OVER THE COUNTER MEDICATION Take 1 capsule by mouth 2 (two) times daily. Herbal Life Cell-U-Loss   Yes Historical Provider, MD  OVER THE COUNTER MEDICATION Take 1 capsule by mouth 2 (two) times daily. Herbal Life Multivitamin Complex   Yes Historical Provider, MD  OVER THE COUNTER MEDICATION Take 1 capsule by mouth 2 (two) times daily. Herbal Life Total Control   Yes Historical Provider, MD  doxycycline (VIBRA-TABS) 100 MG tablet Take 1 tablet (100 mg total) by mouth 2 (two) times daily. 11/29/15   Junius Creamer, NP  HYDROcodone-acetaminophen (NORCO) 5-325 MG tablet Take 1 tablet by mouth every 6 (six) hours as needed for severe pain. Patient not taking: Reported on 11/28/2015 10/29/15   Mercedes Camprubi-Soms, PA-C  naproxen (NAPROSYN) 500 MG tablet Take 1 tablet (500 mg total) by mouth 2 (two) times daily as needed for mild pain, moderate pain or headache (TAKE WITH MEALS.). Patient not taking: Reported on 11/28/2015 10/29/15   Mercedes Camprubi-Soms, PA-C  BP 132/89 mmHg  Pulse 98  Temp(Src) 97.7 F (36.5 C) (Oral)  Resp 18  SpO2 98% Physical Exam  Constitutional: He appears well-developed.  HENT:  Head: Normocephalic.  Eyes: Pupils are equal, round, and reactive to light.  Neck: Normal range of motion. No spinous process tenderness and no muscular tenderness present.    Cardiovascular: Normal rate.   Pulmonary/Chest: Effort normal.  Abdominal: Soft.  Musculoskeletal: Normal range of motion.  Neurological: He is alert.  Skin: Skin is warm.  Psychiatric: He has a  normal mood and affect.    ED Course  Procedures  DIAGNOSTIC STUDIES: Oxygen Saturation is 98% on RA, normal by my interpretation.    COORDINATION OF CARE: 11:35 PM - Discussed plans to numb the area and perform an I&D. Pt advised of plan for treatment and pt agrees.  11:58 PM - Pt states he feels better after the procedure. Discussed plans to order antibiotics. Advised pt to apply warm compresses and to return if the symptoms return or worsen. Pt advised of plan for treatment and pt agrees.   INCISION AND DRAINAGE PROCEDURE NOTE: Patient identification was confirmed and verbal consent was obtained. This procedure was performed by Matthew Balding, NP at 11:42 PM. Site: Posterior Neck Sterile procedures observed Needle size: 25  Anesthetic used (type and amt): Lidocaine 1% Injection 5 mL Blade size: 11  Drainage: Copious and Purulent ( 47 mL) Complexity: Complex Packing used: 1/4 inch iodoform gauze Site anesthetized, incision made over site, wound drained and explored loculations,, covered with dry, sterile dressing.  Pt tolerated procedure well without complications.  Instructions for care discussed verbally and pt provided with additional written instructions for homecare and f/u.  Labs Review Labs Reviewed - No data to display  Imaging Review No results found. I have personally reviewed and evaluated these images and lab results as part of my medical decision-making.   EKG Interpretation None      MDM   Final diagnoses:  Abscess    I personally performed the services described in this documentation, which was scribed in my presence. The recorded information has been reviewed and is accurate.  Junius Creamer, NP 11/29/15 0004  Carmin Muskrat, MD 11/30/15 [place], NP 12/21/15 [id number]  Carmin Muskrat, MD 12/22/15 1540

## 2015-11-28 NOTE — ED Notes (Signed)
Bed: WA29 Expected date:  Expected time:  Means of arrival:  Comments: 

## 2015-11-28 NOTE — ED Notes (Signed)
Pt states he had an abscess to the back of his neck that was drained last month. Followed up at urgent care and had packing placed that he said "fell out." Now the mass has come back, baseball size on the back of his neck. Has appointment on 1/25 with [facility] to have them look at the abscess, but states the pain is unbearable.

## 2015-11-29 ENCOUNTER — Telehealth: Payer: Self-pay | Admitting: General Practice

## 2015-11-29 MED ORDER — DOXYCYCLINE HYCLATE 100 MG PO TABS
100.0000 mg | ORAL_TABLET | Freq: Once | ORAL | Status: AC
  Administered 2015-11-29: 100 mg via ORAL
  Filled 2015-11-29: qty 1

## 2015-11-29 MED ORDER — DOXYCYCLINE HYCLATE 100 MG PO TABS: 100.0000 mg | ORAL_TABLET | Freq: Two times a day (BID) | ORAL | Status: DC

## 2015-11-29 MED ORDER — IBUPROFEN 800 MG PO TABS
800.0000 mg | ORAL_TABLET | Freq: Once | ORAL | Status: AC
  Administered 2015-11-29: 800 mg via ORAL
  Filled 2015-11-29: qty 1

## 2015-11-29 NOTE — ED Notes (Signed)
Patient was alert, oriented and stable upon discharge. RN went over AVS and patient had no further questions.  

## 2015-11-29 NOTE — Telephone Encounter (Signed)
That is ok 

## 2015-11-29 NOTE — Telephone Encounter (Signed)
Kennedy Bucker. MRN # [id number] is a patient of yours and he would like his son to establish care with you. Please advise

## 2015-11-29 NOTE — Discharge Instructions (Signed)
Abscess An abscess (boil or furuncle) is an infected area on or under the skin. This area is filled with yellowish-white fluid (pus) and other material (debris). HOME CARE   Only take medicines as told by your doctor.  If you were given antibiotic medicine, take it as directed. Finish the medicine even if you start to feel better.  If gauze is used, follow your doctor's directions for changing the gauze.  To avoid spreading the infection:  Keep your abscess covered with a bandage.  Wash your hands well.  Do not share personal care items, towels, or whirlpools with others.  Avoid skin contact with others.  Keep your skin and clothes clean around the abscess.  Keep all doctor visits as told. GET HELP RIGHT AWAY IF:   You have more pain, puffiness (swelling), or redness in the wound site.  You have more fluid or blood coming from the wound site.  You have muscle aches, chills, or you feel sick.  You have a fever. MAKE SURE YOU:   Understand these instructions.  Will watch your condition.  Will get help right away if you are not doing well or get worse.   This information is not intended to replace advice given to you by your health care provider. Make sure you discuss any questions you have with your health care provider.   Document Released: 04/28/2008 Document Revised: 05/11/2012 Document Reviewed: 01/24/2012 Elsevier Interactive Patient Education 2016 [place] compress to the area 3-4 times a day for the next 1-2 days  Keep appointment with surgery as scheduled return if getting larger

## 2015-11-30 NOTE — Telephone Encounter (Signed)
Patient scheduled for 03/11/2016 new patient appointment

## 2015-12-19 ENCOUNTER — Ambulatory Visit: Payer: Self-pay | Admitting: General Surgery

## 2015-12-19 ENCOUNTER — Encounter (HOSPITAL_COMMUNITY): Payer: Self-pay | Admitting: *Deleted

## 2015-12-19 ENCOUNTER — Other Ambulatory Visit (HOSPITAL_COMMUNITY): Payer: Self-pay | Admitting: *Deleted

## 2015-12-19 NOTE — H&P (Signed)
Matthew Black. Wenzinger 12/19/2015 9:26 AM Location: [facility] Patient #: [id number] DOB: 1976/08/27 Married / Language: English / Race: Black or African American Male  History of Present Illness Matthew Hollingshead MD; 12/19/2015 10:15 AM) The patient is a 40 year old male.   Note:He presents today for reevaluation of her recurrent neck mass. He has been seen in the past. In 2015, he had a large posterior neck abscess and underwent incision and drainage. This recurred about 6 weeks ago. CT scan done in early December demonstrated an abscess which was drained through a small incision in the emergency department. He states the packing fell out early. He now has recurrent swelling and pain there. No fever or chills. He is a chronic smoker and continues to smoke. The CT scan also suggested the possibility of cavities and some of his molars. I discussed this with him. He has not seen a dentist in a long time.  Allergies Elbert Ewings, CMA; 12/19/2015 9:26 AM) No Known Drug Allergies 10/29/2015  Medication History Elbert Ewings, CMA; 12/19/2015 9:26 AM) No Current Medications Medications Reconciled     Review of Systems Matthew Hollingshead MD; 12/19/2015 10:16 AM)  Note: No fevers, chills, or night sweats. Does have pain at the site of swelling.   Vitals Elbert Ewings CMA; 12/19/2015 9:26 AM) 12/19/2015 9:26 AM Weight: 245.8 lb Height: 71in Body Surface Area: 2.3 m Body Mass Index: 34.28 kg/m  Temp.: 97.48F  Pulse: 111 (Regular)  BP: 130/70 (Sitting, Left Arm, Standard)      Physical Exam Matthew Hollingshead MD; 12/19/2015 10:17 AM)  The physical exam findings are as follows: Note:General: WDWN in NAD. Pleasant and cooperative.  NECK: 7-8 cm posterior neck swelling with multiple small scars present. No drainage or increased warmth. I sterilely aspirated 42 cc of pus from this area and the swelling decreased and his pain resolved. Fluid was sent for  culture.  CV: RRR, no murmur, no JVD.  CHEST: Breath sounds equal and clear. Respirations nonlabored.  NEUROLOGIC: Alert and oriented, answers questions appropriately, normal gait and station.  PSYCHIATRIC: Normal mood, affect , and behavior.    Assessment & Plan Matthew Hollingshead MD; 12/19/2015 10:24 AM)  NECK ABSCESS (L02.11) Impression: This is recurrent and incompletely drained. He feels much better after aspiration but he needs aggressive incision, drainage, and debridement. He is a smoker and I told him that his risk of wound healing problems and recurrence is significantly higher unless he stop smoking.  Plan: Incision, drainage, debridement of recurrent neck abscess tomorrow by Dr. Armandina Gemma. I have discussed his situation extensively with Dr. Harlow Asa. We went over the procedure and the risks. Risks include but are not limited to bleeding, infection, wound healing problems, anesthesia, recurrence. He seems to understand and agrees with the plan.  Jackolyn Confer, MD

## 2015-12-19 NOTE — Anesthesia Preprocedure Evaluation (Addendum)
Anesthesia Evaluation  Patient identified by MRN, date of birth, ID band Patient awake    Reviewed: Allergy & Precautions, H&P , NPO status , Patient's Chart, lab work & pertinent test results  Airway Mallampati: III  TM Distance: >3 FB Neck ROM: Full    Dental no notable dental hx. (+) Dental Advisory Given   Pulmonary Current Smoker,    Pulmonary exam normal breath sounds clear to auscultation       Cardiovascular negative cardio ROS Normal cardiovascular exam Rhythm:Regular Rate:Normal     Neuro/Psych negative neurological ROS  negative psych ROS   GI/Hepatic negative GI ROS, Neg liver ROS,   Endo/Other  negative endocrine ROS  Renal/GU negative Renal ROS  negative genitourinary   Musculoskeletal negative musculoskeletal ROS (+)   Abdominal (+) + obese,   Peds negative pediatric ROS (+)  Hematology negative hematology ROS (+)   Anesthesia Other Findings   Reproductive/Obstetrics negative OB ROS                           Anesthesia Physical  Anesthesia Plan  ASA: II  Anesthesia Plan: General   Post-op Pain Management:    Induction: Intravenous  Airway Management Planned: Oral ETT  Additional Equipment:   Intra-op Plan:   Post-operative Plan: Extubation in OR  Informed Consent: I have reviewed the patients History and Physical, chart, labs and discussed the procedure including the risks, benefits and alternatives for the proposed anesthesia with the patient or authorized representative who has indicated his/her understanding and acceptance.   Dental advisory given  Plan Discussed with: CRNA  Anesthesia Plan Comments: (Check am labs, Grade I view, MAC 4, 7.5 ET last procedure)        Anesthesia Quick Evaluation

## 2015-12-20 ENCOUNTER — Encounter (HOSPITAL_COMMUNITY): Payer: Self-pay | Admitting: *Deleted

## 2015-12-20 ENCOUNTER — Observation Stay (HOSPITAL_COMMUNITY)
Admission: RE | Admit: 2015-12-20 | Discharge: 2015-12-21 | Disposition: A | Discharge status: Home / Self Care | Payer: BLUE CROSS/BLUE SHIELD | Source: Ambulatory Visit | Attending: Surgery | Admitting: Surgery

## 2015-12-20 ENCOUNTER — Ambulatory Visit (HOSPITAL_COMMUNITY): Payer: BLUE CROSS/BLUE SHIELD | Admitting: Anesthesiology

## 2015-12-20 ENCOUNTER — Encounter (HOSPITAL_COMMUNITY): Admission: RE | Disposition: A | Discharge status: Home / Self Care | Payer: Self-pay | Source: Ambulatory Visit

## 2015-12-20 DIAGNOSIS — Z6834 Body mass index (BMI) 34.0-34.9, adult: Secondary | ICD-10-CM | POA: Insufficient documentation

## 2015-12-20 DIAGNOSIS — E669 Obesity, unspecified: Secondary | ICD-10-CM | POA: Diagnosis not present

## 2015-12-20 DIAGNOSIS — L0211 Cutaneous abscess of neck: Secondary | ICD-10-CM | POA: Diagnosis not present

## 2015-12-20 DIAGNOSIS — L02811 Cutaneous abscess of head [any part, except face]: Secondary | ICD-10-CM | POA: Diagnosis present

## 2015-12-20 DIAGNOSIS — F17200 Nicotine dependence, unspecified, uncomplicated: Secondary | ICD-10-CM | POA: Diagnosis not present

## 2015-12-20 DIAGNOSIS — R221 Localized swelling, mass and lump, neck: Secondary | ICD-10-CM | POA: Diagnosis present

## 2015-12-20 HISTORY — PX: INCISION AND DRAINAGE ABSCESS: SHX5864

## 2015-12-20 LAB — BASIC METABOLIC PANEL
Anion gap: 7 (ref 5–15)
BUN: 13 mg/dL (ref 6–20)
CALCIUM: 8.9 mg/dL (ref 8.9–10.3)
CO2: 25 mmol/L (ref 22–32)
CREATININE: 1.17 mg/dL (ref 0.61–1.24)
Chloride: 106 mmol/L (ref 101–111)
GFR calc non Af Amer: >60 mL/min (ref >60)
Glucose, Bld: 105 mg/dL — ABNORMAL HIGH (ref 65–99)
Potassium: 4.3 mmol/L (ref 3.5–5.1)
SODIUM: 138 mmol/L (ref 135–145)

## 2015-12-20 LAB — CBC WITH DIFFERENTIAL/PLATELET
BASOS PCT: 1 %
Basophils Absolute: 0.1 10*3/uL (ref 0.0–0.1)
EOS ABS: 0.2 10*3/uL (ref 0.0–0.7)
EOS PCT: 2 %
HCT: 50 % (ref 39.0–52.0)
HEMOGLOBIN: 16.7 g/dL (ref 13.0–17.0)
Lymphocytes Relative: 44 %
Lymphs Abs: 3.7 10*3/uL (ref 0.7–4.0)
MCH: 28.4 pg (ref 26.0–34.0)
MCHC: 33.4 g/dL (ref 30.0–36.0)
MCV: 85.2 fL (ref 78.0–100.0)
MONOS PCT: 11 %
Monocytes Absolute: 0.9 10*3/uL (ref 0.1–1.0)
NEUTROS PCT: 44 %
Neutro Abs: 3.7 10*3/uL (ref 1.7–7.7)
PLATELETS: 223 10*3/uL (ref 150–400)
RBC: 5.87 MIL/uL — ABNORMAL HIGH (ref 4.22–5.81)
RDW: 15.1 % (ref 11.5–15.5)
WBC: 8.4 10*3/uL (ref 4.0–10.5)

## 2015-12-20 SURGERY — INCISION AND DRAINAGE, ABSCESS
Anesthesia: General | Site: Neck

## 2015-12-20 MED ORDER — CEFAZOLIN SODIUM-DEXTROSE 2-3 GM-% IV SOLR
2.0000 g | INTRAVENOUS | Status: AC
  Administered 2015-12-20: 2 g via INTRAVENOUS

## 2015-12-20 MED ORDER — CEFAZOLIN SODIUM-DEXTROSE 2-3 GM-% IV SOLR
INTRAVENOUS | Status: AC
  Filled 2015-12-20: qty 50

## 2015-12-20 MED ORDER — 0.9 % SODIUM CHLORIDE (POUR BTL) OPTIME
TOPICAL | Status: DC | PRN
  Administered 2015-12-20: 1000 mL

## 2015-12-20 MED ORDER — PROMETHAZINE HCL 25 MG/ML IJ SOLN: 6.2500 mg | INTRAMUSCULAR | Status: DC | PRN

## 2015-12-20 MED ORDER — LIDOCAINE HCL (CARDIAC) 20 MG/ML IV SOLN
INTRAVENOUS | Status: DC | PRN
  Administered 2015-12-20: 100 mg via INTRAVENOUS

## 2015-12-20 MED ORDER — ACETAMINOPHEN 325 MG PO TABS: 650.0000 mg | ORAL_TABLET | Freq: Four times a day (QID) | ORAL | Status: DC | PRN

## 2015-12-20 MED ORDER — FENTANYL CITRATE (PF) 100 MCG/2ML IJ SOLN
INTRAMUSCULAR | Status: DC | PRN
Start: 2015-12-20 — End: 2015-12-20
  Administered 2015-12-20: 50 ug via INTRAVENOUS
  Administered 2015-12-20: 100 ug via INTRAVENOUS
  Administered 2015-12-20: 50 ug via INTRAVENOUS

## 2015-12-20 MED ORDER — ONDANSETRON HCL 4 MG/2ML IJ SOLN
INTRAMUSCULAR | Status: DC | PRN
  Administered 2015-12-20 (×2): 2 mg via INTRAVENOUS
  Administered 2015-12-20: 4 mg via INTRAVENOUS

## 2015-12-20 MED ORDER — SUCCINYLCHOLINE CHLORIDE 20 MG/ML IJ SOLN
INTRAMUSCULAR | Status: DC | PRN
  Administered 2015-12-20: 100 mg via INTRAVENOUS

## 2015-12-20 MED ORDER — KCL IN DEXTROSE-NACL 20-5-0.45 MEQ/L-%-% IV SOLN
INTRAVENOUS | Status: DC
  Administered 2015-12-20: 15:00:00 via INTRAVENOUS
  Filled 2015-12-20 (×2): qty 1000

## 2015-12-20 MED ORDER — PROPOFOL 10 MG/ML IV BOLUS
INTRAVENOUS | Status: AC
  Filled 2015-12-20: qty 20

## 2015-12-20 MED ORDER — BUPIVACAINE-EPINEPHRINE (PF) 0.25% -1:200000 IJ SOLN
INTRAMUSCULAR | Status: AC
  Filled 2015-12-20: qty 30

## 2015-12-20 MED ORDER — LIDOCAINE HCL (CARDIAC) 20 MG/ML IV SOLN
INTRAVENOUS | Status: AC
  Filled 2015-12-20: qty 5

## 2015-12-20 MED ORDER — ACETAMINOPHEN 650 MG RE SUPP: 650.0000 mg | Freq: Four times a day (QID) | RECTAL | Status: DC | PRN

## 2015-12-20 MED ORDER — FENTANYL CITRATE (PF) 100 MCG/2ML IJ SOLN: 25.0000 ug | INTRAMUSCULAR | Status: DC | PRN

## 2015-12-20 MED ORDER — LACTATED RINGERS IV SOLN
INTRAVENOUS | Status: DC
  Administered 2015-12-20: 1000 mL via INTRAVENOUS
  Administered 2015-12-20: 11:00:00 via INTRAVENOUS

## 2015-12-20 MED ORDER — ONDANSETRON HCL 4 MG/2ML IJ SOLN
INTRAMUSCULAR | Status: AC
  Filled 2015-12-20: qty 4

## 2015-12-20 MED ORDER — MIDAZOLAM HCL 5 MG/5ML IJ SOLN
INTRAMUSCULAR | Status: DC | PRN
  Administered 2015-12-20: 2 mg via INTRAVENOUS

## 2015-12-20 MED ORDER — BUPIVACAINE-EPINEPHRINE 0.25% -1:200000 IJ SOLN
INTRAMUSCULAR | Status: DC | PRN
  Administered 2015-12-20: 10 mL

## 2015-12-20 MED ORDER — ROCURONIUM BROMIDE 100 MG/10ML IV SOLN
INTRAVENOUS | Status: DC | PRN
  Administered 2015-12-20: 20 mg via INTRAVENOUS

## 2015-12-20 MED ORDER — ONDANSETRON 4 MG PO TBDP: 4.0000 mg | ORAL_TABLET | Freq: Four times a day (QID) | ORAL | Status: DC | PRN

## 2015-12-20 MED ORDER — EPHEDRINE SULFATE 50 MG/ML IJ SOLN
INTRAMUSCULAR | Status: DC | PRN
  Administered 2015-12-20: 5 mg via INTRAVENOUS

## 2015-12-20 MED ORDER — MEPERIDINE HCL 50 MG/ML IJ SOLN: 6.2500 mg | INTRAMUSCULAR | Status: DC | PRN

## 2015-12-20 MED ORDER — PROPOFOL 10 MG/ML IV BOLUS
INTRAVENOUS | Status: DC | PRN
  Administered 2015-12-20: 200 mg via INTRAVENOUS
  Administered 2015-12-20: 50 mg via INTRAVENOUS

## 2015-12-20 MED ORDER — FENTANYL CITRATE (PF) 100 MCG/2ML IJ SOLN
INTRAMUSCULAR | Status: AC
  Filled 2015-12-20: qty 2

## 2015-12-20 MED ORDER — OXYCODONE HCL 5 MG PO TABS
5.0000 mg | ORAL_TABLET | ORAL | Status: DC | PRN
  Administered 2015-12-20 – 2015-12-21 (×3): 10 mg via ORAL
  Filled 2015-12-20 (×3): qty 2

## 2015-12-20 MED ORDER — ONDANSETRON HCL 4 MG/2ML IJ SOLN: 4.0000 mg | Freq: Four times a day (QID) | INTRAMUSCULAR | Status: DC | PRN

## 2015-12-20 MED ORDER — SUGAMMADEX SODIUM 200 MG/2ML IV SOLN
INTRAVENOUS | Status: DC | PRN
  Administered 2015-12-20: 200 mg via INTRAVENOUS

## 2015-12-20 MED ORDER — DEXAMETHASONE SODIUM PHOSPHATE 10 MG/ML IJ SOLN
INTRAMUSCULAR | Status: DC | PRN
  Administered 2015-12-20: 5 mg via INTRAVENOUS

## 2015-12-20 MED ORDER — SUGAMMADEX SODIUM 200 MG/2ML IV SOLN
INTRAVENOUS | Status: AC
  Filled 2015-12-20: qty 2

## 2015-12-20 MED ORDER — HYDROMORPHONE HCL 1 MG/ML IJ SOLN: 1.0000 mg | INTRAMUSCULAR | Status: DC | PRN

## 2015-12-20 MED ORDER — MIDAZOLAM HCL 2 MG/2ML IJ SOLN
INTRAMUSCULAR | Status: AC
  Filled 2015-12-20: qty 2

## 2015-12-20 MED ORDER — ROCURONIUM BROMIDE 100 MG/10ML IV SOLN
INTRAVENOUS | Status: AC
  Filled 2015-12-20: qty 1

## 2015-12-20 SURGICAL SUPPLY — 29 items
BLADE SURG 15 STRL LF DISP TIS (BLADE) ×1 IMPLANT
BLADE SURG 15 STRL SS (BLADE) ×3
BNDG GAUZE ELAST 4 BULKY (GAUZE/BANDAGES/DRESSINGS) IMPLANT
COVER SURGICAL LIGHT HANDLE (MISCELLANEOUS) ×6 IMPLANT
DECANTER SPIKE VIAL GLASS SM (MISCELLANEOUS) IMPLANT
DRAPE LAPAROSCOPIC ABDOMINAL (DRAPES) IMPLANT
DRSG PAD ABDOMINAL 8X10 ST (GAUZE/BANDAGES/DRESSINGS) IMPLANT
ELECT PENCIL ROCKER SW 15FT (MISCELLANEOUS) ×3 IMPLANT
ELECT REM PT RETURN 9FT ADLT (ELECTROSURGICAL) ×3
ELECTRODE REM PT RTRN 9FT ADLT (ELECTROSURGICAL) ×1 IMPLANT
GAUZE IODOFORM PACK 1/2 7832 (GAUZE/BANDAGES/DRESSINGS) ×2 IMPLANT
GAUZE SPONGE 4X4 12PLY STRL (GAUZE/BANDAGES/DRESSINGS) ×2 IMPLANT
GLOVE BIO SURGEON STRL SZ7.5 (GLOVE) ×3 IMPLANT
GOWN STRL REUS W/TWL LRG LVL3 (GOWN DISPOSABLE) ×6 IMPLANT
KIT BASIN OR (CUSTOM PROCEDURE TRAY) ×3 IMPLANT
NDL HYPO 25X1 1.5 SAFETY (NEEDLE) IMPLANT
NEEDLE HYPO 25X1 1.5 SAFETY (NEEDLE) IMPLANT
NS IRRIG 1000ML POUR BTL (IV SOLUTION) ×3 IMPLANT
SPONGE LAP 18X18 X RAY DECT (DISPOSABLE) ×3 IMPLANT
SUT MNCRL AB 4-0 PS2 18 (SUTURE) IMPLANT
SUT VIC AB 3-0 SH 27 (SUTURE)
SUT VIC AB 3-0 SH 27XBRD (SUTURE) IMPLANT
SWAB COLLECTION DEVICE MRSA (MISCELLANEOUS) IMPLANT
SWAB CULTURE ESWAB REG 1ML (MISCELLANEOUS) IMPLANT
SYR BULB 3OZ (MISCELLANEOUS) ×3 IMPLANT
SYR CONTROL 10ML LL (SYRINGE) IMPLANT
TAPE CLOTH SURG 4X10 WHT LF (GAUZE/BANDAGES/DRESSINGS) ×2 IMPLANT
TOWEL OR 17X26 10 PK STRL BLUE (TOWEL DISPOSABLE) ×3 IMPLANT
YANKAUER SUCT BULB TIP NO VENT (SUCTIONS) ×3 IMPLANT

## 2015-12-20 NOTE — Interval H&P Note (Signed)
History and Physical Interval Note:  12/20/2015 11:09 AM  Matthew Black  has presented today for surgery, with the diagnosis of NECK MASS 7-8CM  The various methods of treatment have been discussed with the patient and family. After consideration of risks, benefits and other options for treatment, the patient has consented to    Procedure(s): INCISION AND DRAINAGE POSTERIOR NECK MASS (N/A) as a surgical intervention .    The patient's history has been reviewed, patient examined, no change in status, stable for surgery.  I have reviewed the patient's chart and labs.  Questions were answered to the patient's satisfaction.    Earnstine Regal, MD, [facility] Office: [place]

## 2015-12-20 NOTE — Brief Op Note (Signed)
12/20/2015  12:10 PM  PATIENT:  Matthew Black  40 y.o. male  PRE-OPERATIVE DIAGNOSIS:  Posterior neck abscess, recurrent  POST-OPERATIVE DIAGNOSIS:  same  PROCEDURE:  Incision, drainage and debridement of posterior neck abscess  SURGEON:  Surgeon(s) and Role:    * Armandina Gemma, MD - Primary  ANESTHESIA:   general  EBL:  Total I/O In: 1000 [I.V.:1000] Out: -   BLOOD ADMINISTERED:none  DRAINS: none   LOCAL MEDICATIONS USED:  MARCAINE     SPECIMEN:  No Specimen  DISPOSITION OF SPECIMEN:  N/A  COUNTS:  YES  TOURNIQUET:  * No tourniquets in log *  DICTATION: .Other Dictation: Dictation Number [id number]  PLAN OF CARE: Admit for overnight observation  PATIENT DISPOSITION:  PACU - hemodynamically stable.   Delay start of Pharmacological VTE agent (>24hrs) due to surgical blood loss or risk of bleeding: yes  Earnstine Regal, MD, [facility] Office: [phone]

## 2015-12-20 NOTE — Transfer of Care (Signed)
Immediate Anesthesia Transfer of Care Note  Patient: Matthew Black  Procedure(s) Performed: Procedure(s): INCISION AND DRAINAGE POSTERIOR NECK MASS (N/A)  Patient Location: PACU  Anesthesia Type:General  Level of Consciousness:  sedated, patient cooperative and responds to stimulation  Airway & Oxygen Therapy:Patient Spontanous Breathing and Patient connected to face mask oxgen  Post-op Assessment:  Report given to PACU RN and Post -op Vital signs reviewed and stable  Post vital signs:  Reviewed and stable  Last Vitals:  Filed Vitals:   12/20/15 0802  BP: 124/80  Pulse: 89  Temp: 36.5 C  Resp: 18    Complications: No apparent anesthesia complications

## 2015-12-20 NOTE — Anesthesia Procedure Notes (Addendum)
Procedure Name: Intubation Date/Time: 12/20/2015 11:23 AM Performed by: Freddie Breech Pre-anesthesia Checklist: Patient identified, Timeout performed, Emergency Drugs available, Suction available and Patient being monitored Patient Re-evaluated:Patient Re-evaluated prior to inductionOxygen Delivery Method: Circle system utilized Preoxygenation: Pre-oxygenation with 100% oxygen Intubation Type: IV induction Ventilation: Mask ventilation without difficulty Laryngoscope Size: Mac and 4 Grade View: Grade I Tube type: Oral Tube size: 7.0 mm Number of attempts: 1 Airway Equipment and Method: Stylet Placement Confirmation: ETT inserted through vocal cords under direct vision,  breath sounds checked- equal and bilateral,  positive ETCO2 and CO2 detector Secured at: 22 cm Tube secured with: Tape Dental Injury: Teeth and Oropharynx as per pre-operative assessment

## 2015-12-20 NOTE — H&P (View-Only) (Signed)
Matthew Black. Husein 12/19/2015 9:26 AM Location: [facility] Patient #: [id number] DOB: [birth year] Married / Language: English / Race: Black or African American Male  History of Present Illness Matthew Hollingshead MD; 12/19/2015 10:15 AM) The patient is a 40 year old male.   Note:He presents today for reevaluation of her recurrent neck mass. He has been seen in the past. In 2015, he had a large posterior neck abscess and underwent incision and drainage. This recurred about 6 weeks ago. CT scan done in early December demonstrated an abscess which was drained through a small incision in the emergency department. He states the packing fell out early. He now has recurrent swelling and pain there. No fever or chills. He is a chronic smoker and continues to smoke. The CT scan also suggested the possibility of cavities and some of his molars. I discussed this with him. He has not seen a dentist in a long time.  Allergies Elbert Ewings, CMA; 12/19/2015 9:26 AM) No Known Drug Allergies 10/29/2015  Medication History Elbert Ewings, CMA; 12/19/2015 9:26 AM) No Current Medications Medications Reconciled     Review of Systems Matthew Hollingshead MD; 12/19/2015 10:16 AM)  Note: No fevers, chills, or night sweats. Does have pain at the site of swelling.   Vitals Elbert Ewings CMA; 12/19/2015 9:26 AM) 12/19/2015 9:26 AM Weight: 245.8 lb Height: 71in Body Surface Area: 2.3 m Body Mass Index: 34.28 kg/m  Temp.: 97.63F  Pulse: 111 (Regular)  BP: 130/70 (Sitting, Left Arm, Standard)      Physical Exam Matthew Hollingshead MD; 12/19/2015 10:17 AM)  The physical exam findings are as follows: Note:General: WDWN in NAD. Pleasant and cooperative.  NECK: 7-8 cm posterior neck swelling with multiple small scars present. No drainage or increased warmth. I sterilely aspirated 42 cc of pus from this area and the swelling decreased and his pain resolved. Fluid was sent for  culture.  CV: RRR, no murmur, no JVD.  CHEST: Breath sounds equal and clear. Respirations nonlabored.  NEUROLOGIC: Alert and oriented, answers questions appropriately, normal gait and station.  PSYCHIATRIC: Normal mood, affect , and behavior.    Assessment & Plan Matthew Hollingshead MD; 12/19/2015 10:24 AM)  NECK ABSCESS (L02.11) Impression: This is recurrent and incompletely drained. He feels much better after aspiration but he needs aggressive incision, drainage, and debridement. He is a smoker and I told him that his risk of wound healing problems and recurrence is significantly higher unless he stop smoking.  Plan: Incision, drainage, debridement of recurrent neck abscess tomorrow by Dr. Armandina Gemma. I have discussed his situation extensively with Dr. Harlow Asa. We went over the procedure and the risks. Risks include but are not limited to bleeding, infection, wound healing problems, anesthesia, recurrence. He seems to understand and agrees with the plan.  Jackolyn Confer, MD

## 2015-12-20 NOTE — Anesthesia Postprocedure Evaluation (Signed)
Anesthesia Post Note  Patient: Matthew Black  Procedure(s) Performed: Procedure(s) (LRB): INCISION AND DRAINAGE POSTERIOR NECK MASS (N/A)  Patient location during evaluation: PACU Anesthesia Type: General Level of consciousness: awake and alert Pain management: pain level controlled Vital Signs Assessment: post-procedure vital signs reviewed and stable Respiratory status: spontaneous breathing, nonlabored ventilation, respiratory function stable and patient connected to nasal cannula oxygen Cardiovascular status: blood pressure returned to baseline and stable Postop Assessment: no signs of nausea or vomiting Anesthetic complications: no    Last Vitals:  Filed Vitals:   12/20/15 0802 12/20/15 1224  BP: 124/80 149/94  Pulse: 89 84  Temp: 36.5 C 36.4 C  Resp: 18 19    Last Pain: There were no vitals filed for this visit.               Samaj Wessells

## 2015-12-21 DIAGNOSIS — L0211 Cutaneous abscess of neck: Secondary | ICD-10-CM | POA: Diagnosis not present

## 2015-12-21 MED ORDER — OXYCODONE HCL 5 MG PO TABS: 5.0000 mg | ORAL_TABLET | Freq: Four times a day (QID) | ORAL | Status: DC | PRN

## 2015-12-21 MED ORDER — ACETAMINOPHEN 325 MG PO TABS: 650.0000 mg | ORAL_TABLET | Freq: Four times a day (QID) | ORAL | Status: DC | PRN

## 2015-12-21 NOTE — Op Note (Signed)
NAMEFILBERTO, FLUCKIGER                ACCOUNT NO.:  [id number]  MEDICAL RECORD NO.:  [id number]  LOCATION:  [id number]                         FACILITY:  [facility]  PHYSICIAN:  Earnstine Regal, MD      DATE OF BIRTH:  [birth year]  DATE OF PROCEDURE:  12/20/2015                              OPERATIVE REPORT   PREOPERATIVE DIAGNOSIS:  Posterior neck abscess.  POSTOPERATIVE DIAGNOSIS:  Posterior neck abscess.  PROCEDURE:  Incision, drainage, and debridement of posterior neck abscess (7 x 5 x 3 cm).  SURGEON:  Earnstine Regal, MD, FACS  ANESTHESIA:  General per Dr. Alexis Frock.  ESTIMATED BLOOD LOSS:  Minimal.  PREPARATION:  Betadine.  COMPLICATIONS:  None.  INDICATIONS:  The patient is a 40 year old black male, presents with third time recurrence of posterior neck abscess.  He underwent aspiration of 40 mL of purulent fluid from the abscess yesterday.  He now comes to the operating room for operative incision, drainage, and debridement under general anesthesia.  BODY OF REPORT:  Procedure was done in OR #2 at the [facility].  The patient was brought to the operating room on a stretcher.  Following induction of general endotracheal anesthesia, the patient was turned to a prone position on the operating room table, and then prepped and draped in the usual aseptic fashion.  After ascertaining that an adequate level of anesthesia had been achieved, an elliptical incision was made with a #10 blade, so as to excise the previous sinus tracts and scar tissue from previous incision and drainages of the abscess.  An ellipse of skin was excised full thickness through the deep subcutaneous tissues and into the abscess cavity. Fluid was evacuated.  Abscess cavity measured 7 x 5 x 3 cm.  It was debrided with a gauze sponge.  Hemostasis was achieved with the electrocautery.  Abscess cavity was irrigated copiously with warm saline, which was evacuated. Wound was anesthetized  circumferentially with local anesthetic.  Cavity was packed with a 1 inch iodoform gauze packing using an entire can of packing to fill the cavity.  Dry gauze dressings were placed.  The patient was awakened from anesthesia and brought to the recovery room. The patient tolerated the procedure well.   Earnstine Regal, MD, [facility] Office: [phone]    TMG/MEDQ  D:  12/20/2015  T:  12/20/2015  Job:  [id number]  cc:   Odis Hollingshead, M.D. [id number] [address] [place] [address]

## 2015-12-21 NOTE — Discharge Instructions (Signed)
REMOVE THE PACKING FROM YOUR NECK WOUND, SHOWER WITH SOAP/WATER, THEN REPACK YOUR WOUND WITH CLEAN SALINE SLIGHTLY MOISTENED GAUZE.  Dressing Change A dressing is a material placed over wounds. It keeps the wound clean, dry, and protected from further injury. This provides an environment that favors wound healing.  BEFORE YOU BEGIN  Get your supplies together. Things you may need include:  Saline solution.  Flexible gauze dressing.  Medicated cream.  Tape.  Gloves.  Abdominal dressing pads.  Gauze squares.  Plastic bags.  Take pain medicine 30 minutes before the dressing change if you need it.  Take a shower before you do the first dressing change of the day. Use plastic wrap or a plastic bag to prevent the dressing from getting wet. REMOVING YOUR OLD DRESSING   Wash your hands with soap and water. Dry your hands with a clean towel.  Put on your gloves.  Remove any tape.  Carefully remove the old dressing. If the dressing sticks, you may dampen it with warm water to loosen it, or follow your caregiver's specific directions.  Remove any gauze or packing tape that is in your wound.  Take off your gloves.  Put the gloves, tape, gauze, or any packing tape into a plastic bag. CHANGING YOUR DRESSING  Open the supplies.  Take the cap off the saline solution.  Open the gauze package so that the gauze remains on the inside of the package.  Put on your gloves.  Clean your wound as told by your caregiver.  If you have been told to keep your wound dry, follow those instructions.  Your caregiver may tell you to do one or more of the following:  Pick up the gauze. Pour the saline solution over the gauze. Squeeze out the extra saline solution.  Put medicated cream or other medicine on your wound if you have been told to do so.  Put the solution soaked gauze only in your wound, not on the skin around it.  Pack your wound loosely or as told by your caregiver.  Put dry  gauze on your wound.  Put abdominal dressing pads over the dry gauze if your wet gauze soaks through.  Tape the abdominal dressing pads in place so they will not fall off. Do not wrap the tape completely around the affected part (arm, leg, abdomen).  Wrap the dressing pads with a flexible gauze dressing to secure it in place.  Take off your gloves. Put them in the plastic bag with the old dressing. Tie the bag shut and throw it away.  Keep the dressing clean and dry until your next dressing change.  Wash your hands. SEEK MEDICAL CARE IF:  Your skin around the wound looks red.  Your wound feels more tender or sore.  You see pus in the wound.  Your wound smells bad.  You have a fever.  Your skin around the wound has a rash that itches and burns.  You see black or yellow skin in your wound that was not there before.  You feel nauseous, throw up, and feel very tired.   This information is not intended to replace advice given to you by your health care provider. Make sure you discuss any questions you have with your health care provider.   Document Released: 12/18/2004 Document Revised: 02/02/2012 Document Reviewed: 09/22/2011 Elsevier Interactive Patient Education 2016 Elsevier Inc.    Abscess An abscess is an infected area that contains a collection of pus and debris.It can occur  in almost any part of the body. An abscess is also known as a furuncle or boil. CAUSES  An abscess occurs when tissue gets infected. This can occur from blockage of oil or sweat glands, infection of hair follicles, or a minor injury to the skin. As the body tries to fight the infection, pus collects in the area and creates pressure under the skin. This pressure causes pain. People with weakened immune systems have difficulty fighting infections and get certain abscesses more often.  SYMPTOMS Usually an abscess develops on the skin and becomes a painful mass that is red, warm, and tender. If the  abscess forms under the skin, you may feel a moveable soft area under the skin. Some abscesses break open (rupture) on their own, but most will continue to get worse without care. The infection can spread deeper into the body and eventually into the bloodstream, causing you to feel ill.  DIAGNOSIS  Your caregiver will take your medical history and perform a physical exam. A sample of fluid may also be taken from the abscess to determine what is causing your infection. TREATMENT  Your caregiver may prescribe antibiotic medicines to fight the infection. However, taking antibiotics alone usually does not cure an abscess. Your caregiver may need to make a small cut (incision) in the abscess to drain the pus. In some cases, gauze is packed into the abscess to reduce pain and to continue draining the area. HOME CARE INSTRUCTIONS   Only take over-the-counter or prescription medicines for pain, discomfort, or fever as directed by your caregiver.  If you were prescribed antibiotics, take them as directed. Finish them even if you start to feel better.  If gauze is used, follow your caregiver's directions for changing the gauze.  To avoid spreading the infection:  Keep your draining abscess covered with a bandage.  Wash your hands well.  Do not share personal care items, towels, or whirlpools with others.  Avoid skin contact with others.  Keep your skin and clothes clean around the abscess.  Keep all follow-up appointments as directed by your caregiver. SEEK MEDICAL CARE IF:   You have increased pain, swelling, redness, fluid drainage, or bleeding.  You have muscle aches, chills, or a general ill feeling.  You have a fever. MAKE SURE YOU:   Understand these instructions.  Will watch your condition.  Will get help right away if you are not doing well or get worse.   This information is not intended to replace advice given to you by your health care provider. Make sure you discuss any  questions you have with your health care provider.   Document Released: 08/20/2005 Document Revised: 05/11/2012 Document Reviewed: 01/23/2012 Elsevier Interactive Patient Education [employer].

## 2015-12-21 NOTE — Care Management Note (Signed)
Case Management Note  Patient Details  Name: Matthew Black MRN: [id number] Date of Birth: 05-24-1976  Subjective/Objective:   S/p Incision, drainage, and debridement of posterior neck abscess                 Action/Plan: Discharge planning, spoke with patient and spouse at bedside. No preference of [place] agency. Contacted [facility], [facility], [facility], [facility], [facility], and [facility]. No one would accept this patient either d/t insurance issues or staffing. Patient did not want to wait any longer, states his wife was shown how to do dressing changes and he had some supplies. He states he will go to Urgent Care to get dressing changed. Notified Dr. Harlow Asa.  Expected Discharge Date:                  Expected Discharge Plan:  [place]  In-House Referral:  NA  Discharge planning Services  CM Consult  Post Acute Care Choice:  Home Health Choice offered to:  Patient  DME Arranged:  N/A DME Agency:  NA  HH Arranged:  RN, Disease Management [place] Agency:  Other - See comment  Status of Service:  Completed, signed off  Medicare Important Message Given:    Date Medicare IM Given:    Medicare IM give by:    Date Additional Medicare IM Given:    Additional Medicare Important Message give by:     If discussed at [place] of Stay Meetings, dates discussed:    Additional Comments:  Guadalupe Maple, RN 12/21/2015, 2:53 PM

## 2015-12-21 NOTE — Discharge Summary (Signed)
  Physician Discharge Summary [facility]  Patient ID: Matthew Black MRN: [id number] DOB/AGE: [birth year] 38 y.o.  Admit date: 12/20/2015 Discharge date: 12/21/2015  Admission Diagnoses:  Posterior neck abscess  Discharge Diagnoses:  Principal Problem:   Scalp abscess Active Problems:   Neck abscess   Discharged Condition: good  Hospital Course: patient admitted for wound care and pain control after incision, drainage, and open packing of posterior neck abscess.  First dressing change to be performed prior to discharge home.  Consults: None  Treatments: surgery: incision, drainage, and open packing of posterior neck abscess  Discharge Exam: Blood pressure 111/57, pulse 66, temperature 98.2 F (36.8 C), temperature source Oral, resp. rate 18, height 5\' 11"  (1.803 m), weight 111.131 kg (245 lb), SpO2 100 %. HEENT - clear Neck - dressing with serous drainage - will change prior to discharge   Disposition: Home     Medication List    ASK your doctor about these medications        doxycycline 100 MG tablet  Commonly known as:  VIBRA-TABS  Take 1 tablet (100 mg total) by mouth 2 (two) times daily.     ibuprofen 200 MG tablet  Commonly known as:  ADVIL,MOTRIN  Take 400 mg by mouth every 6 (six) hours as needed (for pain.).     OVER THE COUNTER MEDICATION  Take 1 capsule by mouth 2 (two) times daily. Herbal Life Cell-U-Loss     OVER THE COUNTER MEDICATION  Take 1 capsule by mouth 2 (two) times daily. Herbal Life Multivitamin Complex     OVER THE COUNTER MEDICATION  Take 1 capsule by mouth 2 (two) times daily. Herbal Life Total Control         Earnstine Regal, MD, [facility] Office: [phone]   Signed: Earnstine Regal 12/21/2015, 9:40 AM

## 2015-12-21 NOTE — Progress Notes (Signed)
Patient ID: Matthew Black, male   DOB: 1976/07/30, 40 y.o.   MRN: [id number]  [facility]  POD#: 1  Subjective: Patient comfortable, visitors in room, on phone.  No complaints.  Objective: Vital signs in last 24 hours: Temp:  [97.5 F (36.4 C)-98.5 F (36.9 C)] 98.2 F (36.8 C) (01/27 0605) Pulse Rate:  [57-85] 66 (01/27 0605) Resp:  [18-23] 18 (01/27 0605) BP: (110-153)/(50-94) 111/57 mmHg (01/27 0605) SpO2:  [93 %-100 %] 100 % (01/27 0605) Last BM Date: 12/20/15  Intake/Output from previous day: 01/26 0701 - 01/27 0700 In: 3587.8 [P.O.:1137; I.V.:2450.8] Out: 1110 [Urine:1100; Blood:10] Intake/Output this shift:    Physical Exam: HEENT - sclerae clear, mucous membranes moist Neck - dressing with serous drainage Neuro - alert & oriented, no focal deficits  Lab Results:   Recent Labs  12/20/15 0845  WBC 8.4  HGB 16.7  HCT 50.0  PLT 223   BMET  Recent Labs  12/20/15 0845  NA 138  K 4.3  CL 106  CO2 25  GLUCOSE 105*  BUN 13  CREATININE 1.17  CALCIUM 8.9   PT/INR No results for input(s): LABPROT, INR in the last 72 hours. Comprehensive Metabolic Panel:    Component Value Date/Time   NA 138 12/20/2015 0845   NA 140 10/29/2015 2109   K 4.3 12/20/2015 0845   K 3.7 10/29/2015 2109   CL 106 12/20/2015 0845   CL 108 10/29/2015 2109   CO2 25 12/20/2015 0845   CO2 23 10/29/2015 2109   BUN 13 12/20/2015 0845   BUN 12 10/29/2015 2109   CREATININE 1.17 12/20/2015 0845   CREATININE 1.15 10/29/2015 2109   GLUCOSE 105* 12/20/2015 0845   GLUCOSE 130* 10/29/2015 2109   CALCIUM 8.9 12/20/2015 0845   CALCIUM 9.1 10/29/2015 2109   AST 18 09/22/2014 0920   ALT 22 09/22/2014 0920   ALKPHOS 118* 09/22/2014 0920   BILITOT 0.6 09/22/2014 0920   PROT 7.8 09/22/2014 0920   ALBUMIN 3.6 09/22/2014 0920    Studies/Results: No results found.  Assessment & Plans: Status post incision, drainage, and packing of posterior neck  abscess  Dressing change this AM with wife present  Will arrange [facility] for wound care  Anticipate discharge home later today  No need for further abx at this time  Follow up at [place] office to be arranged  Earnstine Regal, MD, [facility] Office: [place] 12/21/2015

## 2015-12-21 NOTE — Progress Notes (Signed)
Patient verbalized understanding of discharge instructions. Patient is stable at discharge. 

## 2015-12-25 ENCOUNTER — Emergency Department (HOSPITAL_COMMUNITY)
Admission: EM | Admit: 2015-12-25 | Discharge: 2015-12-25 | Disposition: A | Discharge status: Home / Self Care | Payer: BLUE CROSS/BLUE SHIELD | Source: Home / Self Care

## 2015-12-25 ENCOUNTER — Encounter (HOSPITAL_COMMUNITY): Payer: Self-pay | Admitting: Emergency Medicine

## 2015-12-25 DIAGNOSIS — Z5189 Encounter for other specified aftercare: Secondary | ICD-10-CM | POA: Diagnosis not present

## 2015-12-25 NOTE — Discharge Instructions (Signed)
Please continue wound care as advised by Dr.Gerkin. The area is healing nicely. Please call Dr. Harlow Asa for wound recheck in 2 days, sooner if worse. Return to UC as needed.

## 2015-12-25 NOTE — ED Notes (Signed)
Here for a f/u on abscess and to have dressing changed A&O x4... No acute distress

## 2015-12-25 NOTE — ED Provider Notes (Signed)
CSN: [id number]     Arrival date & time 12/25/15  1904 History   None    Chief Complaint  Patient presents with  . Follow-up   (Consider location/radiation/quality/duration/timing/severity/associated sxs/prior Treatment) HPI Comments: Pt denies any fever or chills, states Dr. Harlow Asa advised him to come to [facility] for dressing change.   Patient is a 40 y.o. male presenting with wound check. The history is provided by the patient. No language interpreter was used.  Wound Check This is a new problem. The current episode started more than 2 days ago. The problem occurs constantly. The problem has been gradually improving. Pertinent negatives include no chest pain, no abdominal pain, no headaches and no shortness of breath. Exacerbated by: dressing changes. Nothing relieves the symptoms. Treatments tried: dressing changes/Percocet. The treatment provided mild relief.    Past Medical History  Diagnosis Date  . Neck mass    Past Surgical History  Procedure Laterality Date  . No past surgeries    . Incision and drainage abscess N/A 09/22/2014    Procedure: INCISION AND DRAINAGE ABSCESS POSTERIOR NECK;  Surgeon: Pedro Earls, MD;  Location: [facility];  Service: General;  Laterality: N/A;  . Incision and drainage abscess N/A 12/20/2015    Procedure: INCISION AND DRAINAGE POSTERIOR NECK MASS;  Surgeon: Armandina Gemma, MD;  Location: [facility];  Service: General;  Laterality: N/A;   Family History  Problem Relation Age of Onset  . Diabetes Other    Social History  Substance Use Topics  . Smoking status: Current Every Day Smoker -- 0.25 packs/day for 7 years    Types: Cigarettes  . Smokeless tobacco: Never Used  . Alcohol Use: Yes     Comment: occasional    Review of Systems  Constitutional: Negative for fever.  Respiratory: Negative for shortness of breath.   Cardiovascular: Negative for chest pain.  Gastrointestinal: Negative for abdominal pain.  Skin: Positive for wound.  Neurological:  Negative for headaches.  All other systems reviewed and are negative.   Allergies  Hydrocodone  Home Medications   Prior to Admission medications   Medication Sig Start Date End Date Taking? Authorizing Provider  acetaminophen (TYLENOL) 325 MG tablet Take 2 tablets (650 mg total) by mouth every 6 (six) hours as needed for mild pain (or temp > 100). 12/21/15   Nat Christen, PA-C  ibuprofen (ADVIL,MOTRIN) 200 MG tablet Take 400 mg by mouth every 6 (six) hours as needed (for pain.).    Historical Provider, MD  OVER THE COUNTER MEDICATION Take 1 capsule by mouth 2 (two) times daily. Herbal Life Cell-U-Loss    Historical Provider, MD  OVER THE COUNTER MEDICATION Take 1 capsule by mouth 2 (two) times daily. Herbal Life Multivitamin Complex    Historical Provider, MD  OVER THE COUNTER MEDICATION Take 1 capsule by mouth 2 (two) times daily. Herbal Life Total Control    Historical Provider, MD  oxyCODONE (OXY IR/ROXICODONE) 5 MG immediate release tablet Take 1-2 tablets (5-10 mg total) by mouth every 6 (six) hours as needed for moderate pain. 12/21/15   Nat Christen, PA-C   Meds Ordered and Administered this Visit  Medications - No data to display  BP 148/87 mmHg  Pulse 96  Temp(Src) 97.8 F (36.6 C) (Oral)  Resp 16  SpO2 100% No data found.   Physical Exam  Constitutional: He is oriented to person, place, and time. He appears well-developed and well-nourished. He is active and cooperative.  Non-toxic appearance. He does  not have a sickly appearance. He does not appear ill. No distress.  Neck: Trachea normal and normal range of motion. Muscular tenderness present.    Neurological: He is alert and oriented to person, place, and time. No cranial nerve deficit or sensory deficit. GCS eye subscore is 4. GCS verbal subscore is 5. GCS motor subscore is 6.  Skin:     Psychiatric: He has a normal mood and affect. His speech is normal.  Nursing note and vitals reviewed.   ED Course  Wound  packing Date/Time: 12/25/2015 8:00 PM Performed by: Daemian Gahm Authorized by: Tori Milks Consent: Verbal consent obtained. Risks and benefits: risks, benefits and alternatives were discussed Consent given by: patient Patient understanding: patient states understanding of the procedure being performed Patient identity confirmed: verbally with patient and provided demographic data Local anesthesia used: no Patient sedated: no Patient tolerance: Patient tolerated the procedure well with no immediate complications Comments: Dressing repacked with saline 4x4, abd pad/dressing applied. Pt tolerated well   (including critical care time)  Labs Review Labs Reviewed - No data to display  Imaging Review No results found.       MDM   1. Visit for wound check    Please continue wound care as advised by Dr.Gerkin. The area is healing nicely. Please call Dr. Harlow Asa for wound recheck in 2 days, sooner if worse. Return to UC as needed. Pt verbalized understanding to this provider   Tori Milks, NP [id number]

## 2016-03-10 ENCOUNTER — Telehealth: Payer: Self-pay | Admitting: *Deleted

## 2016-03-10 NOTE — Telephone Encounter (Signed)
Unable to reach patient at time of pre-visit call. Left message for patient to return call when available.  

## 2016-03-11 ENCOUNTER — Ambulatory Visit (INDEPENDENT_AMBULATORY_CARE_PROVIDER_SITE_OTHER): Payer: BLUE CROSS/BLUE SHIELD | Admitting: Internal Medicine

## 2016-03-11 ENCOUNTER — Encounter: Payer: Self-pay | Admitting: Internal Medicine

## 2016-03-11 VITALS — BP 118/68 | HR 83 | Temp 97.9°F | Ht 71.0 in | Wt 243.5 lb

## 2016-03-11 DIAGNOSIS — E669 Obesity, unspecified: Secondary | ICD-10-CM

## 2016-03-11 DIAGNOSIS — L0211 Cutaneous abscess of neck: Secondary | ICD-10-CM | POA: Diagnosis not present

## 2016-03-11 DIAGNOSIS — Z720 Tobacco use: Secondary | ICD-10-CM | POA: Diagnosis not present

## 2016-03-11 DIAGNOSIS — Z09 Encounter for follow-up examination after completed treatment for conditions other than malignant neoplasm: Secondary | ICD-10-CM

## 2016-03-11 NOTE — Progress Notes (Signed)
   Subjective:    Patient ID: Matthew Black, male    DOB: [birth year], 40 y.o.   MRN: [id number]  DOS:  03/11/2016 Type of visit - description :  Get established, needs a PCP Interval history: Recovering from the drainage of a neck abscess, doing better. Concern about his weight.   Review of Systems   Past Medical History  Diagnosis Date  . Neck mass     Past Surgical History  Procedure Laterality Date  . Incision and drainage abscess N/A 09/22/2014    Procedure: INCISION AND DRAINAGE ABSCESS POSTERIOR NECK;  Surgeon: Pedro Earls, MD;  Location: [facility];  Service: General;  Laterality: N/A;  . Incision and drainage abscess N/A 12/20/2015    Procedure: INCISION AND DRAINAGE POSTERIOR NECK MASS;  Surgeon: Armandina Gemma, MD;  Location: [facility];  Service: General;  Laterality: N/A;    Social History   Social History  . Marital Status: Married    Spouse Name: N/A  . Number of Children: 2  . Years of Education: N/A   Occupational History  . general contractor     Social History Main Topics  . Smoking status: Current Every Day Smoker -- 0.25 packs/day for 7 years    Types: Cigarettes  . Smokeless tobacco: Never Used     Comment: 1/2 ppd   . Alcohol Use: 0.0 oz/week    0 Standard drinks or equivalent per week     Comment: occasional  . Drug Use: No  . Sexual Activity: Yes   Other Topics Concern  . Not on file   Social History Narrative   Household-- pt, wife, 2 children        Medication List    Notice  As of 03/11/2016 11:59 PM   You have not been prescribed any medications.         Objective:   Physical Exam BP 118/68 mmHg  Pulse 83  Temp(Src) 97.9 F (36.6 C) (Oral)  Ht 5\' 11"  (1.803 m)  Wt 243 lb 8 oz (110.451 kg)  BMI 33.98 kg/m2  SpO2 97% General:   Well developed, well nourished . NAD.  HEENT:  Normocephalic . Face symmetric, atraumatic Neck: No thyromegaly, well-healed surgical scar at the posterior aspect of the neck Lungs:  CTA B Normal  respiratory effort, no intercostal retractions, no accessory muscle use. Heart: RRR,  no murmur.  no pretibial edema bilaterally  Abdomen:  Not distended, soft, non-tender. No rebound or rigidity.  Skin: Not pale. Not jaundice Neurologic:  alert & oriented X3.  Speech normal, gait appropriate for age and unassisted Psych--  Cognition and judgment appear intact.  Cooperative with normal attention span and concentration.  Behavior appropriate. No anxious or depressed appearing.    Assessment & Plan:   Assessment Posterior neck abscess, surgery x 2 (last 11-2915)  Plan  Neck abscess-- improving Tobacco abuse: Counseled about quitting. He  already has a plan Obesity: Current BMI is 33, recommend to gradually get to a BMI of 29 over the next several months, several strategies discussed including increased exercise, calorie counting, a more balanced diet. RTC 6 months, CPX  Today, I spent more than 22   min with the patient: >50% of the time counseling regards Diet, exercise, calorie counting. Also benefit of quitting tobacco

## 2016-03-11 NOTE — Patient Instructions (Signed)
  GO TO THE FRONT DESK Schedule your next appointment for a  Physical exam in 6 months, fasting     

## 2016-03-11 NOTE — Progress Notes (Signed)
Pre visit review using our clinic review tool, if applicable. No additional management support is needed unless otherwise documented below in the visit note. 

## 2016-03-12 DIAGNOSIS — Z09 Encounter for follow-up examination after completed treatment for conditions other than malignant neoplasm: Secondary | ICD-10-CM | POA: Insufficient documentation

## 2016-03-12 DIAGNOSIS — E669 Obesity, unspecified: Secondary | ICD-10-CM | POA: Insufficient documentation

## 2016-03-12 NOTE — Assessment & Plan Note (Signed)
Neck abscess-- improving Tobacco abuse: Counseled about quitting. He  already has a plan Obesity: Current BMI is 33, recommend to gradually get to a BMI of 29 over the next several months, several strategies discussed including increased exercise, calorie counting, a more balanced diet. RTC 6 months, CPX

## 2016-09-09 ENCOUNTER — Encounter (HOSPITAL_COMMUNITY): Payer: Self-pay | Admitting: Family Medicine

## 2016-09-09 ENCOUNTER — Ambulatory Visit (HOSPITAL_COMMUNITY)
Admission: EM | Admit: 2016-09-09 | Discharge: 2016-09-09 | Disposition: A | Discharge status: Home / Self Care | Payer: BLUE CROSS/BLUE SHIELD | Attending: Family Medicine | Admitting: Family Medicine

## 2016-09-09 DIAGNOSIS — L0291 Cutaneous abscess, unspecified: Secondary | ICD-10-CM

## 2016-09-09 DIAGNOSIS — S76311A Strain of muscle, fascia and tendon of the posterior muscle group at thigh level, right thigh, initial encounter: Secondary | ICD-10-CM | POA: Diagnosis not present

## 2016-09-09 MED ORDER — DOXYCYCLINE HYCLATE 100 MG PO TABS: 100.0000 mg | ORAL_TABLET | Freq: Two times a day (BID) | ORAL | 0 refills | Status: DC

## 2016-09-09 MED ORDER — HYDROCODONE-ACETAMINOPHEN 5-325 MG PO TABS: 1.0000 | ORAL_TABLET | Freq: Four times a day (QID) | ORAL | 0 refills | Status: DC | PRN

## 2016-09-09 NOTE — ED Provider Notes (Signed)
[facility]    CSN: [id number] Arrival date & time: 09/09/16  [id number]     History   Chief Complaint Chief Complaint  Patient presents with  . Abscess    HPI Matthew Black is a 40 y.o. male.   Is a 40 year old man with right leg and neck pain. 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  With the last 3 days, Matthew Black has developed increasing swelling in the posterior neck area degrees had surgery before for an abscess. The area has become increasing sore but there's been no discharge. He had this area opened surgically last April. His surgeon is Dr. Harlow Asa.  He also has 3 weeks of right hamstring soreness after doing some sprints. He feels that this is slowly getting better and at this point prefers just to stretch it instead of taking medicine for it.      Past Medical History:  Diagnosis Date  . Neck mass     Patient Active Problem List   Diagnosis Date Noted  . Obesity 03/12/2016  . PCP NOTES >>>>>>>>>>>>>>>>>. 03/12/2016  . Scalp abscess 12/20/2015  . Neck abscess 12/20/2015  . Pilonidal cyst 02/08/2013    Past Surgical History:  Procedure Laterality Date  . INCISION AND DRAINAGE ABSCESS N/A 09/22/2014   Procedure: INCISION AND DRAINAGE ABSCESS POSTERIOR NECK;  Surgeon: Pedro Earls, MD;  Location: [facility];  Service: General;  Laterality: N/A;  . INCISION AND DRAINAGE ABSCESS N/A 12/20/2015   Procedure: INCISION AND DRAINAGE POSTERIOR NECK MASS;  Surgeon: Armandina Gemma, MD;  Location: [facility];  Service: General;  Laterality: N/A;       Home Medications    Prior to Admission medications   Medication Sig Start Date End Date Taking? Authorizing Provider  doxycycline (VIBRA-TABS) 100 MG tablet Take 1 tablet (100 mg total) by mouth 2 (two) times daily. 09/09/16   Robyn Haber, MD  HYDROcodone-acetaminophen (NORCO) 5-325 MG tablet Take 1 tablet by mouth every 6 (six) hours as needed for moderate pain. 09/09/16   Robyn Haber, MD     Family History Family History  Problem Relation Age of Onset  . Diabetes Other     GF  . CAD Neg Hx   . Colon cancer Neg Hx   . Prostate cancer Neg Hx     Social History Social History  Substance Use Topics  . Smoking status: Current Every Day Smoker    Packs/day: 0.25    Years: 7.00    Types: Cigarettes  . Smokeless tobacco: Never Used     Comment: 1/2 ppd   . Alcohol use 0.0 oz/week     Comment: occasional     Allergies   Hydrocodone   Review of Systems Review of Systems  Constitutional: Negative.   HENT: Negative.   Respiratory: Negative.   Cardiovascular: Negative.   Musculoskeletal: Positive for myalgias and neck pain.  Neurological: Negative.      Physical Exam Triage Vital Signs ED Triage Vitals [09/09/16 1956]  Enc Vitals Group     BP 114/75     Pulse Rate 76     Resp 14     Temp 98.5 F (36.9 C)     Temp Source Oral     SpO2 100 %     Weight      Height      Head Circumference      Peak Flow      Pain Score  Pain Loc      Pain Edu?      Excl. in [place]?    No data found.   Updated Vital Signs BP 114/75 (BP Location: Right Arm)   Pulse 76   Temp 98.5 F (36.9 C) (Oral)   Resp 14   SpO2 100%   Visual Acuity Right Eye Distance:   Left Eye Distance:   Bilateral Distance:    Right Eye Near:   Left Eye Near:    Bilateral Near:     Physical Exam  Constitutional: He is oriented to person, place, and time. He appears well-developed and well-nourished.  HENT:  Patient has a 0.5 x 4 cm indurated swelling along the hairline in the occipital region of his posterior neck. This was anesthetized with 1% Xylocaine and epinephrine and then I indeed with minimal purulent material resulting. The wound was then dressed.  Eyes: EOM are normal. Pupils are equal, round, and reactive to light.  Neck: Normal range of motion. Neck supple.  Pulmonary/Chest: Effort normal.  Musculoskeletal: Normal range of motion.  Neurological: He is alert and  oriented to person, place, and time.  Skin: Skin is warm and dry.  Nursing note and vitals reviewed.    UC Treatments / Results  Labs (all labs ordered are listed, but only abnormal results are displayed) Labs Reviewed - No data to display  EKG  EKG Interpretation None       Radiology No results found.  Procedures Procedures (including critical care time)  Medications Ordered in UC Medications - No data to display   Initial Impression / Assessment and Plan / UC Course  I have reviewed the triage vital signs and the nursing notes.  Pertinent labs & imaging results that were available during my care of the patient were reviewed by me and considered in my medical decision making (see chart for details).  Clinical Course     Final Clinical Impressions(s) / UC Diagnoses   Final diagnoses:  Abscess  Right hamstring muscle strain, initial encounter    New Prescriptions New Prescriptions   DOXYCYCLINE (VIBRA-TABS) 100 MG TABLET    Take 1 tablet (100 mg total) by mouth 2 (two) times daily.   HYDROCODONE-ACETAMINOPHEN (NORCO) 5-325 MG TABLET    Take 1 tablet by mouth every 6 (six) hours as needed for moderate pain.     Robyn Haber, MD 09/09/16 2035

## 2016-09-09 NOTE — Discharge Instructions (Signed)
You will need to call Dr Gala Lewandowsky office for an appointment tomorrow.  There is still some work to do to get the abscess completely cleared up.  Stretch the right leg regularly to relieve the hamstring strain.

## 2016-09-09 NOTE — ED Triage Notes (Signed)
Here for persistent abscess on back of head   Had surgery at same location in Feb 2017  Denies drainage, fevers, chills  Also c/o right leg pain... States he has been exercising lately   Steady gait... A&O x4... NAD

## 2016-09-11 ENCOUNTER — Ambulatory Visit (INDEPENDENT_AMBULATORY_CARE_PROVIDER_SITE_OTHER): Payer: BLUE CROSS/BLUE SHIELD | Admitting: Internal Medicine

## 2016-09-11 ENCOUNTER — Encounter: Payer: Self-pay | Admitting: Internal Medicine

## 2016-09-11 VITALS — BP 118/76 | HR 76 | Temp 97.8°F | Resp 14 | Ht 71.0 in | Wt 223.5 lb

## 2016-09-11 DIAGNOSIS — Z23 Encounter for immunization: Secondary | ICD-10-CM | POA: Diagnosis not present

## 2016-09-11 DIAGNOSIS — Z0000 Encounter for general adult medical examination without abnormal findings: Secondary | ICD-10-CM

## 2016-09-11 LAB — HEPATIC FUNCTION PANEL
ALK PHOS: 93 U/L (ref 39–117)
ALT: 20 U/L (ref 0–53)
AST: 22 U/L (ref 0–37)
Albumin: 4.4 g/dL (ref 3.5–5.2)
BILIRUBIN DIRECT: 0.2 mg/dL (ref 0.0–0.3)
BILIRUBIN TOTAL: 0.7 mg/dL (ref 0.2–1.2)
Total Protein: 7.6 g/dL (ref 6.0–8.3)

## 2016-09-11 LAB — LIPID PANEL
Cholesterol: 156 mg/dL (ref 0–200)
HDL: 34.9 mg/dL — AB (ref >39.00)
LDL CALC: 108 mg/dL — AB (ref 0–99)
NONHDL: 120.71
Total CHOL/HDL Ratio: 4
Triglycerides: 64 mg/dL (ref 0.0–149.0)
VLDL: 12.8 mg/dL (ref 0.0–40.0)

## 2016-09-11 LAB — HEMOGLOBIN A1C: HEMOGLOBIN A1C: 5.6 % (ref 4.6–6.5)

## 2016-09-11 LAB — TSH: TSH: 1.06 u[IU]/mL (ref 0.35–4.50)

## 2016-09-11 NOTE — Assessment & Plan Note (Signed)
Neck abscess: Follow-up by surgery. Obesity: Doing great. RTC one year, CPX

## 2016-09-11 NOTE — Progress Notes (Signed)
Pre visit review using our clinic review tool, if applicable. No additional management support is needed unless otherwise documented below in the visit note. 

## 2016-09-11 NOTE — Patient Instructions (Signed)
GO TO THE LAB : Get the blood work     GO TO THE FRONT DESK Schedule your next appointment for a  physical exam in one year     Testicular Self-Exam A self-examination of your testicles involves looking at and feeling your testicles for abnormal lumps or swelling. Several things can cause swelling, lumps, or pain in your testicles. Some of these causes are:  Injuries.  Inflammation.  Infection.  Accumulation of fluids around your testicle (hydrocele).  Twisted testicles (testicular torsion).  Testicular cancer. Self-examination of the testicles and groin areas may be advised if you are at risk for testicular cancer. Risks for testicular cancer include:  An undescended testicle (cryptorchidism).  A history of previous testicular cancer.  A family history of testicular cancer. The testicles are easiest to examine after warm baths or showers and are more difficult to examine when you are cold. This is because the muscles attached to the testicles retract and pull them up higher or into the abdomen. Follow these steps while you are standing:  Hold your penis away from your body.  Roll one testicle between your thumb and forefinger, feeling the entire testicle.  Roll the other testicle between your thumb and forefinger, feeling the entire testicle. Feel for lumps, swelling, or discomfort. A normal testicle is egg shaped and feels firm. It is smooth and not tender. The spermatic cord can be felt as a firm spaghetti-like cord at the back of your testicle. It is also important to examine the crease between the front of your leg and your abdomen. Feel for any bumps that are tender. These could be enlarged lymph nodes.    This information is not intended to replace advice given to you by your health care provider. Make sure you discuss any questions you have with your health care provider.   Document Released: 02/16/2001 Document Revised: 07/13/2013 Document Reviewed:  05/02/2013 Elsevier Interactive Patient Education [employer].

## 2016-09-11 NOTE — Assessment & Plan Note (Addendum)
Td today, declined a flu shot [facility], Prostate ca screen- not indicated counseled about diet and exercise. He actually is doing great, exercises 4 times a week, eating healthier, has lost approximately 20 pounds since the beginning of the year Self testicular exam recommended.  Also, quit tobacco 2 months ago, still craving, rec nicotine supplements. Call if cravings increase. Chantix?  Labs : LFTs, FLP, A1c, TSH RTC one year

## 2016-09-11 NOTE — Progress Notes (Signed)
Subjective:    Patient ID: Matthew Black, male    DOB: October 14, 1976, 40 y.o.   MRN: [id number]  DOS:  09/11/2016 Type of visit - description : cpx Interval history: Seen at the urgent care 2 days ago with neck abscess, it was lanceted, on doxycycline; yesterday saw surgery, they lanceted it  again but no pus was obtained per pt. they plan to recheck the area in 2 weeks.  Wt Readings from Last 3 Encounters:  09/11/16 223 lb 8 oz (101.4 kg)  03/11/16 243 lb 8 oz (110.5 kg)  12/20/15 245 lb (111.1 kg)     Review of Systems Constitutional: No fever. No chills. No unexplained wt changes. No unusual sweats  HEENT: No dental problems, no ear discharge, no facial swelling, no voice changes. No eye discharge, no eye  redness , no  intolerance to light   Respiratory: No wheezing , no  difficulty breathing. No cough , no mucus production  Cardiovascular: No CP, no leg swelling , no  Palpitations  GI: no nausea, no vomiting, no diarrhea , no  abdominal pain.  No blood in the stools. No dysphagia, no odynophagia    Endocrine: No polyphagia, no polyuria , no polydipsia  GU: No dysuria, gross hematuria, difficulty urinating. No urinary urgency, no frequency.  Musculoskeletal: No joint swellings or unusual aches or pains  Skin: See history of present illness  Allergic, immunologic: No environmental allergies , no  food allergies  Neurological: No dizziness no  syncope. No headaches. No diplopia, no slurred, no slurred speech, no motor deficits, no facial  Numbness  Hematological: No enlarged lymph nodes, no easy bruising , no unusual bleedings  Psychiatry: No suicidal ideas, no hallucinations, no beavior problems, no confusion.  No unusual/severe anxiety, no depression   Past Medical History:  Diagnosis Date  . Neck mass     Past Surgical History:  Procedure Laterality Date  . INCISION AND DRAINAGE ABSCESS N/A 09/22/2014   Procedure: INCISION AND DRAINAGE ABSCESS POSTERIOR NECK;   Surgeon: Pedro Earls, MD;  Location: [facility];  Service: General;  Laterality: N/A;  . INCISION AND DRAINAGE ABSCESS N/A 12/20/2015   Procedure: INCISION AND DRAINAGE POSTERIOR NECK MASS;  Surgeon: Armandina Gemma, MD;  Location: [facility];  Service: General;  Laterality: N/A;    Social History   Social History  . Marital status: Married    Spouse name: N/A  . Number of children: 2  . Years of education: N/A   Occupational History  . general contractor     Social History Main Topics  . Smoking status: Former Smoker    Packs/day: 0.25    Years: 7.00    Types: Cigarettes    Quit date: 06/2016  . Smokeless tobacco: Never Used     Comment:    . Alcohol use 0.0 oz/week     Comment: occasional  . Drug use: No  . Sexual activity: Yes   Other Topics Concern  . Not on file   Social History Narrative   Household-- pt, wife, 2 children     Family History  Problem Relation Age of Onset  . Diabetes Other     GF  . CAD Neg Hx   . Colon cancer Neg Hx   . Prostate cancer Neg Hx        Medication List       Accurate as of 09/11/16  6:08 PM. Always use your most recent med list.  doxycycline 100 MG tablet Commonly known as:  VIBRA-TABS Take 1 tablet (100 mg total) by mouth 2 (two) times daily.   HYDROcodone-acetaminophen 5-325 MG tablet Commonly known as:  NORCO Take 1 tablet by mouth every 6 (six) hours as needed for moderate pain.          Objective:   Physical Exam  Neck:     BP 118/76 (BP Location: Left Arm, Patient Position: Sitting, Cuff Size: Normal)   Pulse 76   Temp 97.8 F (36.6 C) (Oral)   Resp 14   Ht 5\' 11"  (1.803 m)   Wt 223 lb 8 oz (101.4 kg)   SpO2 98%   BMI 31.17 kg/m   General:   Well developed, well nourished . NAD.  Neck: No  thyromegaly  HEENT:  Normocephalic . Face symmetric, atraumatic Lungs:  CTA B Normal respiratory effort, no intercostal retractions, no accessory muscle use. Heart: RRR,  no murmur.  No pretibial  edema bilaterally  Abdomen:  Not distended, soft, non-tender. No rebound or rigidity.   Skin: Exposed areas without rash. Not pale. Not jaundice Neurologic:  alert & oriented X3.  Speech normal, gait appropriate for age and unassisted Strength symmetric and appropriate for age.  Psych: Cognition and judgment appear intact.  Cooperative with normal attention span and concentration.  Behavior appropriate. No anxious or depressed appearing.    Assessment & Plan:   Assessment Posterior neck abscess, surgery x 2 (last 11-2015)  PLAN: Neck abscess: Follow-up by surgery. Obesity: Doing great. RTC one year, CPX

## 2016-09-13 ENCOUNTER — Emergency Department (HOSPITAL_COMMUNITY)
Admission: EM | Admit: 2016-09-13 | Discharge: 2016-09-14 | Disposition: A | Discharge status: Home / Self Care | Payer: BLUE CROSS/BLUE SHIELD | Attending: Emergency Medicine | Admitting: Emergency Medicine

## 2016-09-13 ENCOUNTER — Encounter (HOSPITAL_COMMUNITY): Payer: Self-pay | Admitting: Emergency Medicine

## 2016-09-13 DIAGNOSIS — L0211 Cutaneous abscess of neck: Secondary | ICD-10-CM | POA: Diagnosis not present

## 2016-09-13 DIAGNOSIS — Z87891 Personal history of nicotine dependence: Secondary | ICD-10-CM | POA: Insufficient documentation

## 2016-09-13 DIAGNOSIS — L0291 Cutaneous abscess, unspecified: Secondary | ICD-10-CM

## 2016-09-13 MED ORDER — OXYCODONE-ACETAMINOPHEN 5-325 MG PO TABS
1.0000 | ORAL_TABLET | Freq: Once | ORAL | Status: AC
  Administered 2016-09-14: 1 via ORAL
  Filled 2016-09-13: qty 1

## 2016-09-13 MED ORDER — LIDOCAINE-EPINEPHRINE (PF) 2 %-1:200000 IJ SOLN: 10.0000 mL | Freq: Once | INTRAMUSCULAR | Status: DC

## 2016-09-13 MED ORDER — LIDOCAINE-EPINEPHRINE (PF) 2 %-1:200000 IJ SOLN: 20.0000 mL | Freq: Once | INTRAMUSCULAR | Status: DC

## 2016-09-13 NOTE — ED Provider Notes (Signed)
[facility] DEPT Provider Note   CSN: [id number] Arrival date & time: 09/13/16  2143 By signing my name below, I, Dyke Brackett, attest that this documentation has been prepared under the direction and in the presence of non-physician practitioner, Dewaine Oats PA-C Electronically Signed: Dyke Brackett, Scribe. 09/13/2016. 11:34 PM.   History   Chief Complaint Chief Complaint  Patient presents with  . Abscess   HPI Matthew Black is a 40 y.o. male with hx of abscess who presents to the Emergency Department complaining of increasing swelling to his posterior neck since last week. Pt has had recurrent abscesses to the area, requiring surgical I&D. He was seen at [facility] for the same on 09/09/16 and was given Doxycycline which he has taken with no relief. He endorses some associated pain and drainage today. He rates his pain as 8/10 in severity. Pt denies any fever.   The history is provided by the patient. No language interpreter was used.   Past Medical History:  Diagnosis Date  . Neck mass     Patient Active Problem List   Diagnosis Date Noted  . Annual physical exam 09/11/2016  . Obesity 03/12/2016  . PCP NOTES >>>>>>>>>>>>>>>>>. 03/12/2016  . Scalp abscess 12/20/2015  . Neck abscess 12/20/2015  . Pilonidal cyst 02/08/2013    Past Surgical History:  Procedure Laterality Date  . INCISION AND DRAINAGE ABSCESS N/A 09/22/2014   Procedure: INCISION AND DRAINAGE ABSCESS POSTERIOR NECK;  Surgeon: Pedro Earls, MD;  Location: [facility];  Service: General;  Laterality: N/A;  . INCISION AND DRAINAGE ABSCESS N/A 12/20/2015   Procedure: INCISION AND DRAINAGE POSTERIOR NECK MASS;  Surgeon: Armandina Gemma, MD;  Location: [facility];  Service: General;  Laterality: N/A;    Home Medications    Prior to Admission medications   Medication Sig Start Date End Date Taking? Authorizing Provider  doxycycline (VIBRA-TABS) 100 MG tablet Take 1 tablet (100 mg total) by mouth 2 (two)  times daily. 09/09/16   Robyn Haber, MD  HYDROcodone-acetaminophen (NORCO) 5-325 MG tablet Take 1 tablet by mouth every 6 (six) hours as needed for moderate pain. 09/09/16   Robyn Haber, MD    Family History Family History  Problem Relation Age of Onset  . Diabetes Other     GF  . CAD Neg Hx   . Colon cancer Neg Hx   . Prostate cancer Neg Hx     Social History Social History  Substance Use Topics  . Smoking status: Former Smoker    Packs/day: 0.25    Years: 7.00    Types: Cigarettes    Quit date: 06/2016  . Smokeless tobacco: Never Used     Comment:    . Alcohol use 0.0 oz/week     Comment: occasional   Allergies   Hydrocodone  Review of Systems Review of Systems  Constitutional: Negative for fever.  Gastrointestinal: Negative for nausea.  Musculoskeletal: Negative for myalgias and neck stiffness.  Skin: Positive for color change and wound.  Neurological: Negative for headaches.   Physical Exam Updated Vital Signs BP 120/78 (BP Location: Left Arm)   Pulse 94   Temp 98.2 F (36.8 C) (Oral)   Resp 17   Ht 5' 11.5" (1.816 m)   Wt 225 lb (102.1 kg)   SpO2 100%   BMI 30.94 kg/m   Physical Exam  Constitutional: He is oriented to person, place, and time. He appears well-developed and well-nourished. No distress.  HENT:  Head: Normocephalic  and atraumatic.  Eyes: Conjunctivae are normal.  Neck:  ROM limited to pain and swelling of posterior neck.   Cardiovascular: Normal rate.   Pulmonary/Chest: Effort normal.  Abdominal: He exhibits no distension.  Neurological: He is alert and oriented to person, place, and time.  Skin: Skin is warm and dry.  Large abscess to posterior neck with area of induration and swelling with an area of 6x8 cm. He has prominent right posterior lymph nodes.   Psychiatric: He has a normal mood and affect.  Nursing note and vitals reviewed.  ED Treatments / Results  DIAGNOSTIC STUDIES:  Oxygen Saturation is 100% on RA, normal  by my interpretation.    COORDINATION OF CARE:  11:19 PM Will order percocet. Discussed treatment plan with pt at bedside and pt agreed to plan.  Labs (all labs ordered are listed, but only abnormal results are displayed) Labs Reviewed - No data to display  EKG  EKG Interpretation None       Radiology No results found.  Procedures Procedures (including critical care time) INCISION AND DRAINAGE Performed by: Charlann Lange A Consent: Verbal consent obtained. Risks and benefits: risks, benefits and alternatives were discussed Type: abscess  Body area: posterior neck  Anesthesia: local infiltration  Incision was made with a scalpel.  Local anesthetic: lidocaine 1% w/o epinephrine  Anesthetic total: 2 ml  Complexity: complex #11 blade used to incise through significant scar tissue. Deeper incision attempted without drainage. Aspiration attempted with 18G with minimal purulent drawback.  Blunt dissection to break up loculations - no return of pus.  Drainage: none  Drainage amount: none  Packing material: none  Patient tolerance: Patient tolerated the procedure well with no immediate complications.    Medications Ordered in ED Medications - No data to display   Initial Impression / Assessment and Plan / ED Course  I have reviewed the triage vital signs and the nursing notes.  Pertinent labs & imaging results that were available during my care of the patient were reviewed by me and considered in my medical decision making (see chart for details).  Clinical Course    Patient with recurrent abscess to posterior neck. I&D attempted without drainage after incision and aspiration of multiple spots with 18G needle. Patient referred back to Dr. Harlow Asa for further management.   Final Clinical Impressions(s) / ED Diagnoses   Final diagnoses:  None  1. Recurrent abscess  New Prescriptions New Prescriptions   No medications on file  I personally performed the  services described in this documentation, which was scribed in my presence. The recorded information has been reviewed and is accurate.      Charlann Lange, PA-C 09/15/16 [place], DO 09/15/16 [phone]

## 2016-09-13 NOTE — ED Triage Notes (Signed)
Pt reports abscess to posterior neck that has enlarged over the last week. Pt reports that he had surgery on it in February and [facility] notified yesterday of it enlarging. Pt reports currently taking Doxycycline for abscess.

## 2016-09-14 MED ORDER — LIDOCAINE HCL 2 % IJ SOLN
10.0000 mL | Freq: Once | INTRAMUSCULAR | Status: AC
  Administered 2016-09-14: 200 mg
  Filled 2016-09-14: qty 20

## 2016-09-14 MED ORDER — OXYCODONE-ACETAMINOPHEN 5-325 MG PO TABS: 1.0000 | ORAL_TABLET | ORAL | 0 refills | Status: DC | PRN

## 2016-09-14 NOTE — Discharge Instructions (Signed)
CONTINUE TAKING YOUR CURRENT ANTIBIOTIC AS PRESCRIBED. TAKE PERCOCET FOR PAIN AS DIRECTED. RETURN TO THE EMERGENCY DEPARTMENT IF YOU DEVELOP A HIGH FEVER, SEVERE PAIN OR NEW CONCERN.

## 2016-09-16 ENCOUNTER — Emergency Department (HOSPITAL_COMMUNITY)
Admission: EM | Admit: 2016-09-16 | Discharge: 2016-09-16 | Disposition: A | Discharge status: Home / Self Care | Payer: BLUE CROSS/BLUE SHIELD | Attending: Emergency Medicine | Admitting: Emergency Medicine

## 2016-09-16 ENCOUNTER — Encounter (HOSPITAL_COMMUNITY): Payer: Self-pay | Admitting: Pharmacy Technician

## 2016-09-16 DIAGNOSIS — L0211 Cutaneous abscess of neck: Secondary | ICD-10-CM | POA: Diagnosis present

## 2016-09-16 DIAGNOSIS — Z79899 Other long term (current) drug therapy: Secondary | ICD-10-CM | POA: Insufficient documentation

## 2016-09-16 DIAGNOSIS — Z87891 Personal history of nicotine dependence: Secondary | ICD-10-CM | POA: Diagnosis not present

## 2016-09-16 DIAGNOSIS — L0291 Cutaneous abscess, unspecified: Secondary | ICD-10-CM

## 2016-09-16 MED ORDER — LIDOCAINE-EPINEPHRINE 1 %-1:200000 IJ SOLN
10.0000 mL | Freq: Once | INTRAMUSCULAR | Status: AC
Start: 2016-09-16 — End: 2016-09-16
  Administered 2016-09-16: 10 mL via INTRADERMAL
  Filled 2016-09-16: qty 30

## 2016-09-16 NOTE — ED Provider Notes (Signed)
[place] DEPT Provider Note   CSN: [place]:[id number] Arrival date & time: 09/16/16  [id number]     History   Chief Complaint Chief Complaint  Patient presents with  . Abscess    HPI Matthew Black is a 40 y.o. male.  The history is provided by the patient.  Abscess  Location:  Head/neck Head/neck abscess location:  L neck Size:  6 cm Abscess quality: fluctuance, induration, painful and redness   Progression:  Worsening Pain details:    Quality:  Throbbing   Severity:  Severe   Timing:  Constant   Progression:  Worsening Chronicity:  Recurrent Context: not diabetes   Relieved by:  Nothing Associated symptoms: no fever and no vomiting    Recurring since 2015. Had I&D by surgery with negative cultures. Has appointment with Surgery on Thursday, but pain is severe. Told to come to the ED for assistance.  Past Medical History:  Diagnosis Date  . Neck mass     Patient Active Problem List   Diagnosis Date Noted  . Annual physical exam 09/11/2016  . Obesity 03/12/2016  . PCP NOTES >>>>>>>>>>>>>>>>>. 03/12/2016  . Scalp abscess 12/20/2015  . Neck abscess 12/20/2015  . Pilonidal cyst 02/08/2013    Past Surgical History:  Procedure Laterality Date  . INCISION AND DRAINAGE ABSCESS N/A 09/22/2014   Procedure: INCISION AND DRAINAGE ABSCESS POSTERIOR NECK;  Surgeon: Pantera Winterrowd Earls, MD;  Location: [facility];  Service: General;  Laterality: N/A;  . INCISION AND DRAINAGE ABSCESS N/A 12/20/2015   Procedure: INCISION AND DRAINAGE POSTERIOR NECK MASS;  Surgeon: Armandina Gemma, MD;  Location: [facility];  Service: General;  Laterality: N/A;       Home Medications    Prior to Admission medications   Medication Sig Start Date End Date Taking? Authorizing Provider  doxycycline (VIBRA-TABS) 100 MG tablet Take 1 tablet (100 mg total) by mouth 2 (two) times daily. 09/09/16   Robyn Haber, MD  HYDROcodone-acetaminophen (NORCO) 5-325 MG tablet Take 1 tablet by mouth every 6 (six) hours as  needed for moderate pain. 09/09/16   Robyn Haber, MD  oxyCODONE-acetaminophen (PERCOCET/ROXICET) 5-325 MG tablet Take 1 tablet by mouth every 4 (four) hours as needed for severe pain. 09/14/16   Charlann Lange, PA-C    Family History Family History  Problem Relation Age of Onset  . Diabetes Other     GF  . CAD Neg Hx   . Colon cancer Neg Hx   . Prostate cancer Neg Hx     Social History Social History  Substance Use Topics  . Smoking status: Former Smoker    Packs/day: 0.25    Years: 7.00    Types: Cigarettes    Quit date: 06/2016  . Smokeless tobacco: Never Used     Comment:    . Alcohol use 0.0 oz/week     Comment: occasional     Allergies   Hydrocodone   Review of Systems Review of Systems  Constitutional: Negative for chills and fever.  HENT: Negative for ear pain and sore throat.   Eyes: Negative for pain and visual disturbance.  Respiratory: Negative for cough and shortness of breath.   Cardiovascular: Negative for chest pain and palpitations.  Gastrointestinal: Negative for abdominal pain and vomiting.  Genitourinary: Negative for dysuria and hematuria.  Musculoskeletal: Negative for arthralgias and back pain.  Skin: Negative for color change and rash.  Neurological: Negative for seizures and syncope.  All other systems reviewed and are negative.  Physical Exam Updated Vital Signs BP 131/85 (BP Location: Right Arm)   Pulse 92   Temp 98.4 F (36.9 C) (Oral)   Resp 16   Ht 6' (1.829 m)   Wt 225 lb (102.1 kg)   SpO2 99%   BMI 30.52 kg/m   Physical Exam  Constitutional: He is oriented to person, place, and time. He appears well-developed and well-nourished. No distress.  HENT:  Head: Normocephalic and atraumatic.  Right Ear: External ear normal.  Left Ear: External ear normal.  Nose: Nose normal.  Mouth/Throat: Mucous membranes are normal. No trismus in the jaw.  Eyes: Conjunctivae and EOM are normal. No scleral icterus.  Neck: Normal  range of motion and phonation normal.    Cardiovascular: Normal rate and regular rhythm.   Pulmonary/Chest: Effort normal. No stridor. No respiratory distress.  Abdominal: He exhibits no distension.  Musculoskeletal: Normal range of motion. He exhibits no edema.  Neurological: He is alert and oriented to person, place, and time.  Skin: He is not diaphoretic.  Psychiatric: He has a normal mood and affect. His behavior is normal.  Vitals reviewed.    ED Treatments / Results  Labs (all labs ordered are listed, but only abnormal results are displayed) Labs Reviewed - No data to display  EKG  EKG Interpretation None       Radiology No results found.  Procedures .Marland KitchenIncision and Drainage Date/Time: 09/16/2016 12:17 PM Performed by: Fatima Blank Authorized by: Fatima Blank   Consent:    Consent obtained:  Verbal   Consent given by:  Patient   Risks discussed:  Incomplete drainage and pain   Alternatives discussed:  Delayed treatment Location:    Type:  Abscess   Size:  6 cm +   Location:  Neck   Neck location:  L posterior Pre-procedure details:    Skin preparation:  Betadine Anesthesia (see MAR for exact dosages):    Anesthesia method:  Local infiltration   Local anesthetic:  Lidocaine 1% WITH epi Procedure type:    Complexity:  Complex Procedure details:    Incision types:  Cruciate   Incision depth:  Subcutaneous   Scalpel blade:  11   Wound management:  Probed and deloculated, irrigated with saline and extensive cleaning   Drainage:  Bloody and purulent   Drainage amount:  Copious   Wound treatment:  Wound left open   Packing material: loop vessel inserted b/w two separate incisions. Post-procedure details:    Patient tolerance of procedure:  Tolerated well, no immediate complications   (including critical care time)  Emergency Focused Ultrasound Exam Limited Ultrasound of Soft Tissue   Performed and interpreted by Dr.  Leonette Monarch Indication: evaluation for infection or foreign body Transverse and Sagittal views of posterior neck are obtained in real time for the purposes of evaluation of skin and underlying soft tissues.  Findings: + heterogeneous fluid collection, no hyperemia/edema of surrounding tissue Interpretation: large multiloculated abscess  Images archived electronically.  CPT Codes:  Neck [id number]  Medications Ordered in ED Medications  lidocaine-EPINEPHrine (XYLOCAINE-EPINEPHrine) 1 %-1:200000 (PF) injection 10 mL (10 mLs Intradermal Given 09/16/16 1215)     Initial Impression / Assessment and Plan / ED Course  I have reviewed the triage vital signs and the nursing notes.  Pertinent labs & imaging results that were available during my care of the patient were reviewed by me and considered in my medical decision making (see chart for details).  Clinical Course    Posterior neck  abscess requiring multiple incision and drainage by surgery. Here for the same. Patient has follow-up with surgery and 2 days however pain has been excruciating and was instructed to present to the ED for systems. Bedside ultrasound revealed multiloculated large abscess adjacent to and underneath thick scar tissue from previous incisions. I&D as above. Loop vessel laced between 2 incision sites to allow for continued drainage. Patient to follow up with surgery in 2 days as scheduled for definitive management.  Final Clinical Impressions(s) / ED Diagnoses   Final diagnoses:  Abscess   Disposition: Discharge  Condition: Good  I have discussed the results, Dx and Tx plan with the patient who expressed understanding and agree(s) with the plan. Discharge instructions discussed at great length. The patient was given strict return precautions who verbalized understanding of the instructions. No further questions at time of discharge.    Current Discharge Medication List      Follow Up: Armandina Gemma, MD [place] [address] [phone]  On 09/18/2016 As Scheduled for close follow up to assess for neck abscess      Fatima Blank, MD 09/16/16 1224

## 2016-09-16 NOTE — ED Triage Notes (Signed)
Pt presents to the ED with complaints of Neck pain that started approx 1 week ago. Pt was dx with a neck abcess on the 18th and was given abx. Pt reports coming here on Saturday and states they tried to do an I&D and nothing came out. Pt reports pain and swelling have been getting worse. Pt reports having fevers at home.

## 2016-09-18 ENCOUNTER — Ambulatory Visit: Payer: Self-pay | Admitting: Surgery

## 2016-09-18 NOTE — H&P (Signed)
  Matthew Black. [facility] Location: [facility] Surgery Patient #: [id number] DOB: December 31, 1975 Married / Language: English / Race: Black or African American Male   History of Present Illness (Aletha Allebach A. Kae Heller MD; 09/10/2016 5:27 PM) Patient words: This gentleman had a abscess on the posterior superior neck drained in April, he had been doing open packing changes since then, but the wound closed about 2 weeks ago and then recently started to swell and felt infected to him. He presented to the emergency room yesterday where an incision and drainage was performed with residual mass in the region. He is referred here for further evaluation. He was started on doxycycline by the emergency room. He's been afebrile. He has undergone an I&D in the office by me on 10/18 and also 2 days ago with US guidance in the Er. They left a vessel loop and he will finish abx tomorrow.   The patient is a 40 year old male.   Allergies  Hydrocodone-Acetaminophen *ANALGESICS - OPIOID* Nausea. No Known Drug Allergies03/29/2017 (Marked as Inactive)  Medication History  Tylenol (325MG  Tablet, Oral as needed) Active. Ibuprofen (200MG  Tablet, Oral as needed) Active. OxyCODONE HCl (5MG  Tablet, Oral as needed) Active. Medications Reconciled  Vitals  Weight: 227 lb Height: 71in Body Surface Area: 2.23 m Body Mass Index: 31.66 kg/m  Temp.: 36F(Temporal)  Pulse: 81 (Regular)  BP: 126/78 (Sitting, Left Arm, Standard)     Physical Exam (Sherica Paternostro A. Kae Heller MD; 09/10/2016 5:28 PM) The physical exam findings are as follows: Note:Alert and oriented, no distress Unlabored respirations regular rate and rhythm Abdomen is benign Extremities are warm and well perfused On the posterior neck within the hair of the scalp there is a 5 x 3 cm area of induration with overlying fluctuance, this area is tender but much less so than last week, vessel loop in place with scant purulent drainage    Assessment & Plan  (Oval Cavazos A. Eathan Groman MD; 09/10/2016 5:29 PM) NECK ABSCESS (Principal Diagnosis) (L02.11) Story: Recurrent infection following secondary closure of posterior neck I&D site. Now s/p I&D x 2. -Will leave vessel loop in place. -Plan for excision of cyst in Or. We discussed the risk of recurrent infection, bleeding, pain scarring. I will attempt to excise the cyst and close the wound but we discussed possibility of open wound to heal by secondary intention.

## 2016-10-03 ENCOUNTER — Encounter (HOSPITAL_COMMUNITY): Payer: Self-pay

## 2016-10-03 ENCOUNTER — Encounter (HOSPITAL_COMMUNITY)
Admission: RE | Admit: 2016-10-03 | Discharge: 2016-10-03 | Disposition: A | Discharge status: Home / Self Care | Payer: BLUE CROSS/BLUE SHIELD | Source: Ambulatory Visit | Attending: Surgery | Admitting: Surgery

## 2016-10-03 NOTE — Pre-Procedure Instructions (Signed)
Pt denies any history of HTN, DM, blood disorders, cardiac or pulmonary disorders.    Pt had EKG in 2015 at ED visit (epic) for neck cyst.  Per anesthesia parameters, pt does not qualify for EKG or labs at today's pre-op visit.  Ordered Hemoglobin for morning of surgery.

## 2016-10-03 NOTE — Patient Instructions (Addendum)
JED MASSAQUOI  10/03/2016   Your procedure is scheduled on: 10/08/16  Report to [facility] Main  Entrance take [facility]  elevators to 3rd floor to  [place] at 10:00 AM.  Call this number if you have problems the morning of surgery [phone]   Remember: ONLY 1 PERSON MAY GO WITH YOU TO SHORT STAY TO GET  READY MORNING OF [place].  Do not eat food or drink liquids :After Midnight.     Take these medicines the morning of surgery with A SIP OF WATER: Oxycodone-acetaminphen if needed.                               You may not have any metal on your body including hair pins and              piercings  Do not wear jewelry, make-up, lotions, powders or perfumes, deodorant             Do not wear nail polish.  Do not shave  48 hours prior to surgery.              Men may shave face and neck.   Do not bring valuables to the hospital. [place].  Contacts, dentures or bridgework may not be worn into surgery.  Leave suitcase in the car. After surgery it may be brought to your room.     Patients discharged the day of surgery will not be allowed to drive home.  Name and phone number of your driver: Melene Muller (wife) [phone]               Please read over the following fact sheets you were given: _____________________________________________________________________             [facility] - Preparing for Surgery Before surgery, you can play an important role.  Because skin is not sterile, your skin needs to be as free of germs as possible.  You can reduce the number of germs on your skin by washing with CHG (chlorahexidine gluconate) soap before surgery.  CHG is an antiseptic cleaner which kills germs and bonds with the skin to continue killing germs even after washing. Please DO NOT use if you have an allergy to CHG or antibacterial soaps.  If your skin becomes reddened/irritated stop using the CHG and  inform your nurse when you arrive at Short Stay. Do not shave (including legs and underarms) for at least 48 hours prior to the first CHG shower.  You may shave your face/neck. Please follow these instructions carefully:  1.  Shower with CHG Soap the night before surgery and the  morning of Surgery.  2.  If you choose to wash your hair, wash your hair first as usual with your  normal  shampoo.  3.  After you shampoo, rinse your hair and body thoroughly to remove the  shampoo.                           4.  Use CHG as you would any other liquid soap.  You can apply chg directly  to the skin and wash  Gently with a scrungie or clean washcloth.  5.  Apply the CHG Soap to your body ONLY FROM THE NECK DOWN.   Do not use on face/ open                           Wound or open sores. Avoid contact with eyes, ears mouth and genitals (private parts).                       Wash face,  Genitals (private parts) with your normal soap.             6.  Wash thoroughly, paying special attention to the area where your surgery  will be performed.  7.  Thoroughly rinse your body with warm water from the neck down.  8.  DO NOT shower/wash with your normal soap after using and rinsing off  the CHG Soap.                9.  Pat yourself dry with a clean towel.            10.  Wear clean pajamas.            11.  Place clean sheets on your bed the night of your first shower and do not  sleep with pets. Day of Surgery : Do not apply any lotions/deodorants the morning of surgery.  Please wear clean clothes to the hospital/surgery center.  FAILURE TO FOLLOW THESE INSTRUCTIONS MAY RESULT IN THE CANCELLATION OF YOUR SURGERY PATIENT SIGNATURE_________________________________  NURSE SIGNATURE__________________________________  ________________________________________________________________________

## 2016-10-08 ENCOUNTER — Ambulatory Visit (HOSPITAL_COMMUNITY): Payer: BLUE CROSS/BLUE SHIELD | Admitting: Certified Registered"

## 2016-10-08 ENCOUNTER — Encounter (HOSPITAL_COMMUNITY): Payer: Self-pay | Admitting: *Deleted

## 2016-10-08 ENCOUNTER — Encounter (HOSPITAL_COMMUNITY)
Admission: RE | Disposition: A | Discharge status: Home / Self Care | Payer: Self-pay | Source: Ambulatory Visit | Attending: Surgery

## 2016-10-08 ENCOUNTER — Ambulatory Visit (HOSPITAL_COMMUNITY)
Admission: RE | Admit: 2016-10-08 | Discharge: 2016-10-08 | Disposition: A | Discharge status: Home / Self Care | Payer: BLUE CROSS/BLUE SHIELD | Source: Ambulatory Visit | Attending: Surgery | Admitting: Surgery

## 2016-10-08 DIAGNOSIS — L728 Other follicular cysts of the skin and subcutaneous tissue: Secondary | ICD-10-CM | POA: Diagnosis not present

## 2016-10-08 DIAGNOSIS — Z6830 Body mass index (BMI) 30.0-30.9, adult: Secondary | ICD-10-CM | POA: Diagnosis not present

## 2016-10-08 DIAGNOSIS — Z87891 Personal history of nicotine dependence: Secondary | ICD-10-CM | POA: Insufficient documentation

## 2016-10-08 DIAGNOSIS — Z885 Allergy status to narcotic agent status: Secondary | ICD-10-CM | POA: Diagnosis not present

## 2016-10-08 DIAGNOSIS — L0211 Cutaneous abscess of neck: Secondary | ICD-10-CM | POA: Diagnosis not present

## 2016-10-08 HISTORY — PX: CYST EXCISION: SHX5701

## 2016-10-08 LAB — HEMOGLOBIN: HEMOGLOBIN: 15.7 g/dL (ref 13.0–17.0)

## 2016-10-08 SURGERY — CYST REMOVAL
Anesthesia: Choice | Site: Neck

## 2016-10-08 MED ORDER — OXYCODONE-ACETAMINOPHEN 5-325 MG PO TABS
1.0000 | ORAL_TABLET | Freq: Once | ORAL | Status: AC
  Administered 2016-10-08: 1 via ORAL
  Filled 2016-10-08: qty 1

## 2016-10-08 MED ORDER — ONDANSETRON HCL 4 MG PO TABS: 4.0000 mg | ORAL_TABLET | Freq: Three times a day (TID) | ORAL | 0 refills | Status: DC | PRN

## 2016-10-08 MED ORDER — FENTANYL CITRATE (PF) 100 MCG/2ML IJ SOLN
INTRAMUSCULAR | Status: AC
  Administered 2016-10-08: 50 ug via INTRAVENOUS
  Filled 2016-10-08: qty 2

## 2016-10-08 MED ORDER — SUCCINYLCHOLINE CHLORIDE 200 MG/10ML IV SOSY
PREFILLED_SYRINGE | INTRAVENOUS | Status: DC | PRN
  Administered 2016-10-08: 140 mg via INTRAVENOUS

## 2016-10-08 MED ORDER — CLINDAMYCIN HCL 300 MG PO CAPS: 300.0000 mg | ORAL_CAPSULE | Freq: Three times a day (TID) | ORAL | 0 refills | Status: AC

## 2016-10-08 MED ORDER — LIDOCAINE 2% (20 MG/ML) 5 ML SYRINGE
INTRAMUSCULAR | Status: DC | PRN
  Administered 2016-10-08: 20 mg via INTRAVENOUS

## 2016-10-08 MED ORDER — EPHEDRINE 5 MG/ML INJ
INTRAVENOUS | Status: AC
  Filled 2016-10-08: qty 10

## 2016-10-08 MED ORDER — MIDAZOLAM HCL 5 MG/5ML IJ SOLN
INTRAMUSCULAR | Status: DC | PRN
  Administered 2016-10-08: 2 mg via INTRAVENOUS

## 2016-10-08 MED ORDER — CHLORHEXIDINE GLUCONATE CLOTH 2 % EX PADS: 6.0000 | MEDICATED_PAD | Freq: Once | CUTANEOUS | Status: DC

## 2016-10-08 MED ORDER — PROPOFOL 10 MG/ML IV BOLUS
INTRAVENOUS | Status: AC
  Filled 2016-10-08: qty 20

## 2016-10-08 MED ORDER — ONDANSETRON HCL 4 MG/2ML IJ SOLN
INTRAMUSCULAR | Status: DC | PRN
  Administered 2016-10-08: 4 mg via INTRAVENOUS

## 2016-10-08 MED ORDER — FENTANYL CITRATE (PF) 100 MCG/2ML IJ SOLN
INTRAMUSCULAR | Status: AC
  Filled 2016-10-08: qty 2

## 2016-10-08 MED ORDER — SUCCINYLCHOLINE CHLORIDE 20 MG/ML IJ SOLN
INTRAMUSCULAR | Status: AC
  Filled 2016-10-08: qty 1

## 2016-10-08 MED ORDER — OXYCODONE-ACETAMINOPHEN 5-325 MG PO TABS: 1.0000 | ORAL_TABLET | Freq: Four times a day (QID) | ORAL | 0 refills | Status: DC | PRN

## 2016-10-08 MED ORDER — FENTANYL CITRATE (PF) 100 MCG/2ML IJ SOLN
25.0000 ug | INTRAMUSCULAR | Status: DC | PRN
  Administered 2016-10-08 (×3): 50 ug via INTRAVENOUS

## 2016-10-08 MED ORDER — MIDAZOLAM HCL 2 MG/2ML IJ SOLN
INTRAMUSCULAR | Status: AC
  Filled 2016-10-08: qty 2

## 2016-10-08 MED ORDER — DOCUSATE SODIUM 100 MG PO CAPS: 100.0000 mg | ORAL_CAPSULE | Freq: Two times a day (BID) | ORAL | 0 refills | Status: AC

## 2016-10-08 MED ORDER — PROMETHAZINE HCL 25 MG/ML IJ SOLN: 6.2500 mg | INTRAMUSCULAR | Status: DC | PRN

## 2016-10-08 MED ORDER — LIDOCAINE 2% (20 MG/ML) 5 ML SYRINGE
INTRAMUSCULAR | Status: AC
  Filled 2016-10-08: qty 5

## 2016-10-08 MED ORDER — MEPERIDINE HCL 50 MG/ML IJ SOLN: 6.2500 mg | INTRAMUSCULAR | Status: DC | PRN

## 2016-10-08 MED ORDER — DEXAMETHASONE SODIUM PHOSPHATE 10 MG/ML IJ SOLN
INTRAMUSCULAR | Status: DC | PRN
  Administered 2016-10-08: 10 mg via INTRAVENOUS

## 2016-10-08 MED ORDER — 0.9 % SODIUM CHLORIDE (POUR BTL) OPTIME
TOPICAL | Status: DC | PRN
  Administered 2016-10-08: 1000 mL

## 2016-10-08 MED ORDER — CEFAZOLIN SODIUM-DEXTROSE 2-4 GM/100ML-% IV SOLN
INTRAVENOUS | Status: AC
  Filled 2016-10-08: qty 100

## 2016-10-08 MED ORDER — EPHEDRINE SULFATE-NACL 50-0.9 MG/10ML-% IV SOSY
PREFILLED_SYRINGE | INTRAVENOUS | Status: DC | PRN
  Administered 2016-10-08: 10 mg via INTRAVENOUS

## 2016-10-08 MED ORDER — ONDANSETRON HCL 4 MG/2ML IJ SOLN
INTRAMUSCULAR | Status: AC
  Filled 2016-10-08: qty 2

## 2016-10-08 MED ORDER — MIDAZOLAM HCL 2 MG/2ML IJ SOLN: 0.5000 mg | Freq: Once | INTRAMUSCULAR | Status: DC | PRN

## 2016-10-08 MED ORDER — LACTATED RINGERS IV SOLN
INTRAVENOUS | Status: DC
  Administered 2016-10-08: 12:00:00 via INTRAVENOUS

## 2016-10-08 MED ORDER — CEFAZOLIN SODIUM-DEXTROSE 2-4 GM/100ML-% IV SOLN
2.0000 g | INTRAVENOUS | Status: AC
  Administered 2016-10-08: 2 g via INTRAVENOUS

## 2016-10-08 MED ORDER — DEXAMETHASONE SODIUM PHOSPHATE 10 MG/ML IJ SOLN
INTRAMUSCULAR | Status: AC
  Filled 2016-10-08: qty 1

## 2016-10-08 MED ORDER — PROPOFOL 10 MG/ML IV BOLUS
INTRAVENOUS | Status: DC | PRN
  Administered 2016-10-08: 50 mg via INTRAVENOUS
  Administered 2016-10-08: 200 mg via INTRAVENOUS

## 2016-10-08 MED ORDER — FENTANYL CITRATE (PF) 100 MCG/2ML IJ SOLN
INTRAMUSCULAR | Status: DC | PRN
  Administered 2016-10-08: 50 ug via INTRAVENOUS
  Administered 2016-10-08: 100 ug via INTRAVENOUS
  Administered 2016-10-08: 50 ug via INTRAVENOUS

## 2016-10-08 SURGICAL SUPPLY — 31 items
BINDER BREAST LRG (GAUZE/BANDAGES/DRESSINGS) IMPLANT
BINDER BREAST MEDIUM (GAUZE/BANDAGES/DRESSINGS) IMPLANT
BLADE SURG SZ10 CARB STEEL (BLADE) ×3 IMPLANT
BNDG GAUZE ELAST 4 BULKY (GAUZE/BANDAGES/DRESSINGS) IMPLANT
CHLORAPREP W/TINT 26ML (MISCELLANEOUS) ×3 IMPLANT
COVER SURGICAL LIGHT HANDLE (MISCELLANEOUS) ×3 IMPLANT
DECANTER SPIKE VIAL GLASS SM (MISCELLANEOUS) ×3 IMPLANT
DRAIN PENROSE 18X1/4 LTX STRL (WOUND CARE) ×2 IMPLANT
DRAPE LAPAROTOMY TRNSV 102X78 (DRAPE) ×3 IMPLANT
DRSG PAD ABDOMINAL 8X10 ST (GAUZE/BANDAGES/DRESSINGS) IMPLANT
ELECT REM PT RETURN 9FT ADLT (ELECTROSURGICAL) ×3
ELECTRODE REM PT RTRN 9FT ADLT (ELECTROSURGICAL) ×1 IMPLANT
GAUZE SPONGE 4X4 12PLY STRL (GAUZE/BANDAGES/DRESSINGS) ×2 IMPLANT
GAUZE SPONGE 4X4 16PLY XRAY LF (GAUZE/BANDAGES/DRESSINGS) ×3 IMPLANT
GLOVE BIO SURGEON STRL SZ 6 (GLOVE) ×3 IMPLANT
GLOVE INDICATOR 6.5 STRL GRN (GLOVE) ×3 IMPLANT
GOWN STRL REUS W/TWL LRG LVL3 (GOWN DISPOSABLE) ×3 IMPLANT
GOWN STRL REUS W/TWL XL LVL3 (GOWN DISPOSABLE) ×5 IMPLANT
KIT BASIN OR (CUSTOM PROCEDURE TRAY) ×3 IMPLANT
MARKER SKIN DUAL TIP RULER LAB (MISCELLANEOUS) ×3 IMPLANT
NEEDLE HYPO 22GX1.5 SAFETY (NEEDLE) ×2 IMPLANT
PACK GENERAL/GYN (CUSTOM PROCEDURE TRAY) ×3 IMPLANT
SUT ETHILON 2 0 PS N (SUTURE) ×4 IMPLANT
SUT MNCRL AB 4-0 PS2 18 (SUTURE) ×3 IMPLANT
SUT VIC AB 3-0 SH 27 (SUTURE) ×3
SUT VIC AB 3-0 SH 27X BRD (SUTURE) IMPLANT
SYR CONTROL 10ML LL (SYRINGE) ×2 IMPLANT
TAPE CLOTH SURG 6X10 WHT LF (GAUZE/BANDAGES/DRESSINGS) ×2 IMPLANT
TOWEL OR 17X26 10 PK STRL BLUE (TOWEL DISPOSABLE) ×3 IMPLANT
TOWEL OR NON WOVEN STRL DISP B (DISPOSABLE) ×3 IMPLANT
YANKAUER SUCT BULB TIP 10FT TU (MISCELLANEOUS) ×3 IMPLANT

## 2016-10-08 NOTE — Transfer of Care (Signed)
Immediate Anesthesia Transfer of Care Note  Patient: Matthew Black  Procedure(s) Performed: Procedure(s): EXCISION OF POSTERIOR NECK CYST (N/A)  Patient Location: PACU  Anesthesia Type:General  Level of Consciousness:  sedated, patient cooperative and responds to stimulation  Airway & Oxygen Therapy:Patient Spontanous Breathing and Patient connected to face mask oxgen  Post-op Assessment:  Report given to PACU RN and Post -op Vital signs reviewed and stable  Post vital signs:  Reviewed and stable  Last Vitals:  Vitals:   10/08/16 1026  BP: 128/62  Pulse: 76  Resp: 16  Temp: 123XX123 C    Complications: No apparent anesthesia complications

## 2016-10-08 NOTE — Anesthesia Procedure Notes (Signed)
Procedure Name: Intubation Date/Time: 10/08/2016 12:12 PM Performed by: Lajuana Carry E Pre-anesthesia Checklist: Patient identified, Emergency Drugs available, Suction available and Patient being monitored Patient Re-evaluated:Patient Re-evaluated prior to inductionOxygen Delivery Method: Circle system utilized Preoxygenation: Pre-oxygenation with 100% oxygen Intubation Type: IV induction Ventilation: Mask ventilation without difficulty Laryngoscope Size: Miller and 3 Grade View: Grade I Tube type: Oral Tube size: 7.0 mm Number of attempts: 1 Airway Equipment and Method: Stylet Placement Confirmation: ETT inserted through vocal cords under direct vision,  positive ETCO2 and breath sounds checked- equal and bilateral Secured at: 23 cm Tube secured with: Tape Dental Injury: Teeth and Oropharynx as per pre-operative assessment

## 2016-10-08 NOTE — Anesthesia Preprocedure Evaluation (Addendum)
Anesthesia Evaluation  Patient identified by MRN, date of birth, ID band Patient awake    Reviewed: Allergy & Precautions, NPO status , Patient's Chart, lab work & pertinent test results  History of Anesthesia Complications Negative for: history of anesthetic complications  Airway Mallampati: II  TM Distance: >3 FB Neck ROM: Full    Dental  (+) Missing, Dental Advisory Given   Pulmonary former smoker (recently quit),    breath sounds clear to auscultation       Cardiovascular negative cardio ROS   Rhythm:Regular Rate:Normal     Neuro/Psych negative neurological ROS     GI/Hepatic negative GI ROS, Neg liver ROS,   Endo/Other  Morbid obesity  Renal/GU negative Renal ROS     Musculoskeletal   Abdominal (+) + obese,   Peds  Hematology negative hematology ROS (+)   Anesthesia Other Findings   Reproductive/Obstetrics                            Anesthesia Physical Anesthesia Plan  ASA: II  Anesthesia Plan: General   Post-op Pain Management:    Induction: Intravenous  Airway Management Planned: Oral ETT  Additional Equipment:   Intra-op Plan:   Post-operative Plan: Extubation in OR  Informed Consent: I have reviewed the patients History and Physical, chart, labs and discussed the procedure including the risks, benefits and alternatives for the proposed anesthesia with the patient or authorized representative who has indicated his/her understanding and acceptance.   Dental advisory given  Plan Discussed with: CRNA and Surgeon  Anesthesia Plan Comments: (Plan routine monitors, GETA)        Anesthesia Quick Evaluation

## 2016-10-08 NOTE — Interval H&P Note (Signed)
History and Physical Interval Note:  10/08/2016 11:41 AM  Matthew Black  has presented today for surgery, with the diagnosis of posterior neck cyst abscess  The various methods of treatment have been discussed with the patient and family. After consideration of risks, benefits and other options for treatment, the patient has consented to  Procedure(s): EXCISION OF POSTERIOR NECK CYST (N/A) as a surgical intervention .  The patient's history has been reviewed, patient examined, no change in status, stable for surgery.  I have reviewed the patient's chart and labs.  Questions were answered to the patient's satisfaction.     Hanin Decook Rich Brave

## 2016-10-08 NOTE — Discharge Instructions (Signed)
[facility]  POST OP INSTRUCTIONS  Always review your discharge instruction sheet given to you by the facility where your surgery was performed. IF YOU HAVE DISABILITY OR FAMILY LEAVE FORMS, YOU MUST BRING THEM TO THE OFFICE FOR PROCESSING.   DO NOT GIVE THEM TO YOUR DOCTOR.  1. A  prescription for pain medication may be given to you upon discharge.  Take your pain medication as prescribed, if needed.  If narcotic pain medicine is not needed, then you may take acetaminophen (Tylenol) or ibuprofen (Advil) as needed. 2. Take your usually prescribed medications unless otherwise directed. If you need a refill on your pain medication, please contact your pharmacy.  They will contact our office to request authorization. Prescriptions will not be filled after 5 pm or on week-ends. 3. You should follow a light diet the first 24 hours after arrival home, such as soup and crackers, etc.  Be sure to include lots of fluids daily.  Resume your normal diet the day after surgery. 4. Most patients will experience some swelling and bruising around the area of surgery. Ice may help.  Swelling and bruising can take several days to resolve.  6. It is common to experience some constipation if taking pain medication after surgery.  Increasing fluid intake and taking a stool softener (such as Colace) will usually help or prevent this problem from occurring.  A mild laxative (Milk of Magnesia or Miralax) should be taken according to package directions if there are no bowel movements after 48 hours. 7. Unless discharge instructions indicate otherwise, you may remove your bandages 24-48 hours after surgery, and you may shower at that time.  Re-cover with dry gauze and tape to protect clothing. There is a small drain in place which will remain there until the wound is beginning to heal.   Any sutures or staples will be removed at the office during your follow-up visits. 8. ACTIVITIES:  You may resume  regular (light) daily activities beginning the next day--such as daily self-care, walking, climbing stairs--gradually increasing activities as tolerated.  You may have sexual intercourse when it is comfortable.    a.You may drive when you are no longer taking prescription pain medication, you can comfortably wear a seatbelt, and you can safely maneuver your car and apply brakes. b.RETURN TO WORK:  1 week_____________________________________________  9.You should see your doctor in the office for a follow-up appointment approximately 2-3 weeks after your surgery.  Make sure that you call for this appointment within a day or two after you arrive home to insure a convenient appointment time. 10.OTHER INSTRUCTIONS: _________________________    _____________________________________  WHEN TO CALL YOUR DOCTOR: 1. Fever over 101.0 2. Inability to urinate 3. Nausea and/or vomiting 4. Extreme swelling or bruising 5. Continued bleeding from incision. 6. Increased pain, redness, or drainage from the incision  The clinic staff is available to answer your questions during regular business hours.  Please dont hesitate to call and ask to speak to one of the nurses for clinical concerns.  If you have a medical emergency, go to the nearest emergency room or call 911.  A surgeon from [facility] is always on call at the hospital   [address] [place], [place], [place]  [address] ?  [address], [place], [place]   [address] [phone] ? [phone] ? FAX [phone] Web site: [email or web address]

## 2016-10-08 NOTE — Anesthesia Postprocedure Evaluation (Signed)
Anesthesia Post Note  Patient: Matthew Black  Procedure(s) Performed: Procedure(s) (LRB): EXCISION OF POSTERIOR NECK CYST (N/A)  Patient location during evaluation: PACU Anesthesia Type: General Level of consciousness: awake and alert, oriented and patient cooperative Pain management: pain level controlled Vital Signs Assessment: post-procedure vital signs reviewed and stable Respiratory status: spontaneous breathing, nonlabored ventilation and respiratory function stable Cardiovascular status: blood pressure returned to baseline and stable Postop Assessment: no signs of nausea or vomiting Anesthetic complications: no    Last Vitals:  Vitals:   10/08/16 1455 10/08/16 1539  BP: 131/83 124/71  Pulse: 65 69  Resp: 18 18  Temp: 36.6 C 36.4 C    Last Pain:  Vitals:   10/08/16 1539  TempSrc: Oral  PainSc: 3                  Jamisyn Langer,E. Thersa Mohiuddin

## 2016-10-08 NOTE — Op Note (Signed)
Operative Note  Matthew Black  AE:[id number]  [id number]  10/08/2016   Surgeon: Clovis Riley  Assistant: none  Procedure performed: Excision of posterior neck cyst  Preop diagnosis:  Recurrent infections of posterior neck cyst  Post-op diagnosis/intraop findings: Thickened overlying scar tissue with a 3-4cm chronically inflamed cyst in the deep soft tissues, just superficial to the muscle layer. opaque, odorless fluid in cyst.  Specimens: posterior neck skin/cyst  EBL: [id number]  Complications: none  Description of procedure: After obtaining informed consent the patient was taken to the operating room and placed supine on operating room table wheregeneral endotracheal anesthesia was initiated, preoperative antibiotics were administered, SCDs applied, and a formal timeout was performed. He was then turned to the prone position with all pressure points appropriately padded. The posterior neck/scalp were clipped, prepped and draped in the usual sterile fashion. An ellipse of skin around the 5cm palpable abnormality was incised and dissected with cautery. The cyst was noted to be deep to this and just superficial to the muscle. The skin ellipse which was thickened and fibrotic was excised and passed off for pathology. This better exposed the underlying cyst, which was leaking white, opaque but odorless fluid through the site where a vessel loop had been threaded prior to the case during an ER I&D. The cyst wall was friable, pink and gelatinous. The cyst wall was excised using cautery to expose healthy underlying tissue. Hemostasis was achieved in the wound with cautery. The wound at this point was 7x3cm with a depth of 3-4 cm and a slight amount of tracking to the patient's left. A penrose drain was introduced into the wound and tucked into the left lateral recess. The drain was tacked to the skin externally with a 2-0 nylon. The wound was then closed over the penrose in 2 layers with 3-0  vicryl in the deep dermis followed by interrupted vertical mattress sutures of 2-0 nylon in the skin. A dressing of dry gauze and tape was applied.  The patient was then awakened extubated and taken to PACU in stable condition.   All counts were correct at the completion of the case

## 2016-10-08 NOTE — H&P (View-Only) (Signed)
  Matthew Black. [facility] Location: [facility] Patient #: [id number] DOB: [birth year] Married / Language: English / Race: Black or African American Male   History of Present Illness (Cato Liburd A. Kae Heller MD; 09/10/2016 5:27 PM) Patient words: This gentleman had a abscess on the posterior superior neck drained in April, he had been doing open packing changes since then, but the wound closed about 2 weeks ago and then recently started to swell and felt infected to him. He presented to the emergency room yesterday where an incision and drainage was performed with residual mass in the region. He is referred here for further evaluation. He was started on doxycycline by the emergency room. He's been afebrile. He has undergone an I&D in the office by me on 10/18 and also 2 days ago with US guidance in the Er. They left a vessel loop and he will finish abx tomorrow.   The patient is a 40 year old male.   Allergies  Hydrocodone-Acetaminophen *ANALGESICS - OPIOID* Nausea. No Known Drug Allergies03/29/2017 (Marked as Inactive)  Medication History  Tylenol (325MG  Tablet, Oral as needed) Active. Ibuprofen (200MG  Tablet, Oral as needed) Active. OxyCODONE HCl (5MG  Tablet, Oral as needed) Active. Medications Reconciled  Vitals  Weight: 227 lb Height: 71in Body Surface Area: 2.23 m Body Mass Index: 31.66 kg/m  Temp.: 64F(Temporal)  Pulse: 81 (Regular)  BP: 126/78 (Sitting, Left Arm, Standard)     Physical Exam (Greyson Riccardi A. Kae Heller MD; 09/10/2016 5:28 PM) The physical exam findings are as follows: Note:Alert and oriented, no distress Unlabored respirations regular rate and rhythm Abdomen is benign Extremities are warm and well perfused On the posterior neck within the hair of the scalp there is a 5 x 3 cm area of induration with overlying fluctuance, this area is tender but much less so than last week, vessel loop in place with scant purulent drainage    Assessment & Plan  (Kristinia Leavy A. Tatum Massman MD; 09/10/2016 5:29 PM) NECK ABSCESS (Principal Diagnosis) (L02.11) Story: Recurrent infection following secondary closure of posterior neck I&D site. Now s/p I&D x 2. -Will leave vessel loop in place. -Plan for excision of cyst in Or. We discussed the risk of recurrent infection, bleeding, pain scarring. I will attempt to excise the cyst and close the wound but we discussed possibility of open wound to heal by secondary intention.

## 2016-10-09 ENCOUNTER — Encounter (HOSPITAL_COMMUNITY): Payer: Self-pay | Admitting: Surgery

## 2017-06-24 DIAGNOSIS — L730 Acne keloid: Secondary | ICD-10-CM

## 2017-06-24 HISTORY — DX: Acne keloid: L73.0

## 2017-07-03 ENCOUNTER — Encounter (HOSPITAL_BASED_OUTPATIENT_CLINIC_OR_DEPARTMENT_OTHER): Payer: Self-pay | Admitting: *Deleted

## 2017-07-03 NOTE — Pre-Procedure Instructions (Signed)
Pt. advised to stop herbal supplements 07/05/2017 until after surgery

## 2017-07-05 ENCOUNTER — Ambulatory Visit (HOSPITAL_COMMUNITY)
Admission: EM | Admit: 2017-07-05 | Discharge: 2017-07-05 | Disposition: A | Discharge status: Home / Self Care | Payer: BLUE CROSS/BLUE SHIELD | Attending: Family Medicine | Admitting: Family Medicine

## 2017-07-05 ENCOUNTER — Encounter (HOSPITAL_COMMUNITY): Payer: Self-pay | Admitting: *Deleted

## 2017-07-05 DIAGNOSIS — L0211 Cutaneous abscess of neck: Secondary | ICD-10-CM | POA: Diagnosis not present

## 2017-07-05 MED ORDER — SULFAMETHOXAZOLE-TRIMETHOPRIM 800-160 MG PO TABS: 1.0000 | ORAL_TABLET | Freq: Two times a day (BID) | ORAL | 0 refills | Status: AC

## 2017-07-05 MED ORDER — NAPROXEN 500 MG PO TABS: 500.0000 mg | ORAL_TABLET | Freq: Two times a day (BID) | ORAL | 0 refills | Status: DC | PRN

## 2017-07-05 NOTE — Discharge Instructions (Signed)
Due to the extensive nature of the abscess and history of surgery in the same area, recommend referral to Surgeon group you have seen before. Recommend call [facility] tomorrow to schedule appointment for further evaluation. Start Bactrim antibiotic twice a day as directed. May take Naproxen 500mg  twice a day as needed for pain and swelling. May apply cool compresses to area for comfort since warm compresses are more painful. Follow-up with the [facility] as planned.

## 2017-07-05 NOTE — ED Provider Notes (Signed)
[facility]    CSN: [id number] Arrival date & time: 07/05/17  1217     History   Chief Complaint Chief Complaint  Patient presents with  . Neck Pain    HPI Matthew Black is a 41 y.o. male.   41 year old male presents with a recurrent neck skin abscess that has become more painful and larger in the past 2 to 3 weeks. Denies any distinct injury but has had surgery (I & D) multiple times in same area to treat previous abscesses. Last I & D was in November 2017 by [facility] group. Has excisional surgery scheduled in 5 days at [facility]. Has taken Ibuprofen 400mg  and applied warm compresses to area with minimal relief. Pain is now radiating to his left shoulder and along the left side of his face. No other chronic health issues. Takes no daily medication.    The history is provided by the patient.    Past Medical History:  Diagnosis Date  . 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 Annual physical exam 09/11/2016  . Obesity 03/12/2016  . PCP NOTES >>>>>>>>>>>>>>>>>. 03/12/2016  . Scalp abscess 12/20/2015  . Neck abscess 12/20/2015  . Pilonidal cyst 02/08/2013    Past Surgical History:  Procedure Laterality Date  . CYST EXCISION N/A 10/08/2016   Procedure: EXCISION OF POSTERIOR NECK CYST;  Surgeon: Clovis Riley, MD;  Location: [facility] ORS;  Service: General;  Laterality: N/A;  . INCISION AND DRAINAGE ABSCESS N/A 09/22/2014   Procedure: INCISION AND DRAINAGE ABSCESS POSTERIOR NECK;  Surgeon: Pedro Earls, MD;  Location: [facility] ORS;  Service: General;  Laterality: N/A;  . INCISION AND DRAINAGE ABSCESS N/A 12/20/2015   Procedure: INCISION AND DRAINAGE POSTERIOR NECK MASS;  Surgeon: Armandina Gemma, MD;  Location: [facility] ORS;  Service: General;  Laterality: N/A;  . INCISION AND DRAINAGE ABSCESS Left 07/10/2004   middle finger       Home Medications    Prior to Admission medications     Medication Sig Start Date End Date Taking? Authorizing Provider  Multiple Vitamin (MULTIVITAMIN) tablet Take 1 tablet by mouth daily.   Yes [provider]  OVER THE COUNTER MEDICATION HERBALIFE TOTAL CONTROL, CELL U LOSS, [place]   Yes [provider]  naproxen (NAPROSYN) 500 MG tablet Take 1 tablet (500 mg total) by mouth 2 (two) times daily as needed for moderate pain. 07/05/17   Katy Apo, NP  sulfamethoxazole-trimethoprim (BACTRIM DS,SEPTRA DS) 800-160 MG tablet Take 1 tablet by mouth 2 (two) times daily. 07/05/17 07/15/17  Katy Apo, NP    Family History Family History  Problem Relation Age of Onset  . Diabetes Other        GF    Social History Social History  Substance Use Topics  . Smoking status: Former Smoker    Packs/day: 0.00    Quit date: 11/24/2015  . Smokeless tobacco: Never Used     Comment:    . Alcohol use 0.0 oz/week     Comment: occasionally     Allergies   Hydrocodone   Review of Systems Review of Systems  Constitutional: Negative for appetite change, chills, fatigue and fever.  HENT: Negative for ear discharge, ear pain and facial swelling.   Gastrointestinal: Negative for diarrhea, nausea and vomiting.  Musculoskeletal: Positive for neck pain. Negative for arthralgias, myalgias and neck stiffness.  Skin: Positive for color  change and wound. Negative for rash.  Allergic/Immunologic: Negative for immunocompromised state.  Neurological: Positive for headaches. Negative for dizziness, tremors, seizures, syncope, weakness, light-headedness and numbness.     Physical Exam Triage Vital Signs ED Triage Vitals [07/05/17 1316]  Enc Vitals Group     BP 138/85     Pulse Rate 65     Resp 16     Temp 98 F (36.7 C)     Temp Source Oral     SpO2 98 %     Weight      Height      Head Circumference      Peak Flow      Pain Score 9     Pain Loc      Pain Edu?      Excl. in [place]?    No data found.   Updated Vital  Signs BP 138/85 (BP Location: Right Arm)   Pulse 65   Temp 98 F (36.7 C) (Oral)   Resp 16   SpO2 98%   Visual Acuity Right Eye Distance:   Left Eye Distance:   Bilateral Distance:    Right Eye Near:   Left Eye Near:    Bilateral Near:     Physical Exam  Constitutional: He is oriented to person, place, and time. He appears well-developed and well-nourished. No distress.  HENT:  Head: Normocephalic and atraumatic.  Right Ear: Hearing and external ear normal.  Left Ear: Hearing and external ear normal.  Nose: Nose normal.  Eyes: Conjunctivae and EOM are normal.  Neck: Trachea normal and normal range of motion. Neck supple. Erythema present. Normal range of motion present.    Multiple (at least 3) abscesses present on left side of lower occipital area of neck that extends to right side. Largest abscess about 6cm in length and 4cm wide. Hard and very tender. No central core or drainage. Erythema extends from left post auricular area to right lower occipital area. Previous surgical scar present extending most of the length of the current abscess. Has full range of motion of neck but pain with movement due to inflammation and swelling.   Cardiovascular: Normal rate and regular rhythm.   Pulmonary/Chest: Effort normal.  Musculoskeletal: He exhibits tenderness.  Neurological: He is alert and oriented to person, place, and time. He has normal strength. No cranial nerve deficit or sensory deficit.  Skin: Skin is warm and dry. Lesion (abscess present as noted above) noted. There is erythema.  Psychiatric: He has a normal mood and affect. His behavior is normal.     UC Treatments / Results  Labs (all labs ordered are listed, but only abnormal results are displayed) Labs Reviewed - No data to display  EKG  EKG Interpretation None       Radiology No results found.  Procedures Procedures (including critical care time)  Medications Ordered in UC Medications - No data to  display   Initial Impression / Assessment and Plan / UC Course  I have reviewed the triage vital signs and the nursing notes.  Pertinent labs & imaging results that were available during my care of the patient were reviewed by me and considered in my medical decision making (see chart for details).    Discussed with patient that abscess is too extensive to attempt to I & D at Urgent Care- needs surgical intervention, most likely under sedation. Discussed that patient already has excisional surgery scheduled in 5 days. Will start on antibiotics and anti-inflammatories and  will recommend patient call Surgeon tomorrow for further evaluation. Start Bactrim DS 1 tablet twice a day as directed. Take Naproxen 500mg  twice a day as needed for pain and swelling. Try applying cool compresses to area for comfort since warm compresses are more painful. Follow-up with [facility] as planned.   Final Clinical Impressions(s) / UC Diagnoses   Final diagnoses:  Abscess, neck    New Prescriptions Discharge Medication List as of 07/05/2017  2:52 PM    START taking these medications   Details  naproxen (NAPROSYN) 500 MG tablet Take 1 tablet (500 mg total) by mouth 2 (two) times daily as needed for moderate pain., Starting Sun 07/05/2017, Normal    sulfamethoxazole-trimethoprim (BACTRIM DS,SEPTRA DS) 800-160 MG tablet Take 1 tablet by mouth 2 (two) times daily., Starting Sun 07/05/2017, Until Wed 07/15/2017, Normal         Controlled Substance Prescriptions [place] Controlled Substance Registry consulted? No   Katy Apo, NP 07/05/17 2222

## 2017-07-05 NOTE — ED Triage Notes (Signed)
Patient reports 2 week history of left neck pain. Patient reports history of neck surgery from abscess. Denies any injury. Reports OTC meds and warm compressess with no relief.

## 2017-07-10 ENCOUNTER — Ambulatory Visit (HOSPITAL_BASED_OUTPATIENT_CLINIC_OR_DEPARTMENT_OTHER)
Admission: RE | Admit: 2017-07-10 | Payer: BLUE CROSS/BLUE SHIELD | Source: Ambulatory Visit | Admitting: Plastic Surgery

## 2017-07-10 HISTORY — DX: Acne keloid: L73.0

## 2017-07-10 SURGERY — LESION EXCISION WITH COMPLEX REPAIR
Anesthesia: General

## 2017-07-20 ENCOUNTER — Encounter (HOSPITAL_BASED_OUTPATIENT_CLINIC_OR_DEPARTMENT_OTHER): Payer: Self-pay | Admitting: *Deleted

## 2017-07-20 NOTE — Anesthesia Preprocedure Evaluation (Addendum)
Anesthesia Evaluation  Patient identified by MRN, date of birth, ID band  Reviewed: Allergy & Precautions, NPO status , Patient's Chart, lab work & pertinent test results  History of Anesthesia Complications Negative for: history of anesthetic complications  Airway Mallampati: II  TM Distance: >3 FB Neck ROM: Full    Dental  (+) Missing, Dental Advisory Given   Pulmonary former smoker,    Pulmonary exam normal        Cardiovascular negative cardio ROS Normal cardiovascular exam     Neuro/Psych negative neurological ROS  negative psych ROS   GI/Hepatic negative GI ROS, Neg liver ROS,   Endo/Other  Morbid obesity  Renal/GU negative Renal ROS     Musculoskeletal   Abdominal (+) + obese,   Peds  Hematology negative hematology ROS (+)   Anesthesia Other Findings   Reproductive/Obstetrics                            Anesthesia Physical  Anesthesia Plan  ASA: II  Anesthesia Plan: General   Post-op Pain Management:    Induction: Intravenous  PONV Risk Score and Plan: 3 and Ondansetron, Dexamethasone and Diphenhydramine  Airway Management Planned: Oral ETT  Additional Equipment:   Intra-op Plan:   Post-operative Plan: Extubation in OR  Informed Consent: I have reviewed the patients History and Physical, chart, labs and discussed the procedure including the risks, benefits and alternatives for the proposed anesthesia with the patient or authorized representative who has indicated his/her understanding and acceptance.   Dental advisory given  Plan Discussed with: CRNA, Anesthesiologist and Surgeon  Anesthesia Plan Comments:        Anesthesia Quick Evaluation

## 2017-07-21 ENCOUNTER — Encounter (HOSPITAL_BASED_OUTPATIENT_CLINIC_OR_DEPARTMENT_OTHER): Payer: Self-pay

## 2017-07-21 ENCOUNTER — Ambulatory Visit (HOSPITAL_BASED_OUTPATIENT_CLINIC_OR_DEPARTMENT_OTHER): Payer: BLUE CROSS/BLUE SHIELD | Admitting: Anesthesiology

## 2017-07-21 ENCOUNTER — Encounter (HOSPITAL_BASED_OUTPATIENT_CLINIC_OR_DEPARTMENT_OTHER)
Admission: RE | Disposition: A | Discharge status: Home / Self Care | Payer: Self-pay | Source: Ambulatory Visit | Attending: Plastic Surgery

## 2017-07-21 ENCOUNTER — Ambulatory Visit (HOSPITAL_BASED_OUTPATIENT_CLINIC_OR_DEPARTMENT_OTHER)
Admission: RE | Admit: 2017-07-21 | Discharge: 2017-07-21 | Disposition: A | Discharge status: Home / Self Care | Payer: BLUE CROSS/BLUE SHIELD | Source: Ambulatory Visit | Attending: Plastic Surgery | Admitting: Plastic Surgery

## 2017-07-21 DIAGNOSIS — Z6829 Body mass index (BMI) 29.0-29.9, adult: Secondary | ICD-10-CM | POA: Insufficient documentation

## 2017-07-21 DIAGNOSIS — Z87891 Personal history of nicotine dependence: Secondary | ICD-10-CM | POA: Diagnosis not present

## 2017-07-21 DIAGNOSIS — E669 Obesity, unspecified: Secondary | ICD-10-CM | POA: Diagnosis not present

## 2017-07-21 DIAGNOSIS — L730 Acne keloid: Secondary | ICD-10-CM | POA: Insufficient documentation

## 2017-07-21 HISTORY — PX: MASS EXCISION: SHX2000

## 2017-07-21 SURGERY — EXCISION MASS
Anesthesia: General | Site: Neck

## 2017-07-21 MED ORDER — MIDAZOLAM HCL 2 MG/2ML IJ SOLN
INTRAMUSCULAR | Status: AC
  Filled 2017-07-21: qty 2

## 2017-07-21 MED ORDER — LIDOCAINE HCL (CARDIAC) 20 MG/ML IV SOLN
INTRAVENOUS | Status: DC | PRN
  Administered 2017-07-21: 100 mg via INTRAVENOUS

## 2017-07-21 MED ORDER — DOXYCYCLINE HYCLATE 50 MG PO CAPS: 50.0000 mg | ORAL_CAPSULE | Freq: Two times a day (BID) | ORAL | 0 refills | Status: DC

## 2017-07-21 MED ORDER — BACITRACIN-NEOMYCIN-POLYMYXIN 400-5-5000 EX OINT
TOPICAL_OINTMENT | CUTANEOUS | Status: AC
  Filled 2017-07-21: qty 1

## 2017-07-21 MED ORDER — FENTANYL CITRATE (PF) 100 MCG/2ML IJ SOLN
INTRAMUSCULAR | Status: AC
  Filled 2017-07-21: qty 2

## 2017-07-21 MED ORDER — PROPOFOL 10 MG/ML IV BOLUS
INTRAVENOUS | Status: DC | PRN
  Administered 2017-07-21: 200 mg via INTRAVENOUS

## 2017-07-21 MED ORDER — DEXAMETHASONE SODIUM PHOSPHATE 4 MG/ML IJ SOLN
INTRAMUSCULAR | Status: DC | PRN
  Administered 2017-07-21: 10 mg via INTRAVENOUS

## 2017-07-21 MED ORDER — ONDANSETRON HCL 4 MG/2ML IJ SOLN
INTRAMUSCULAR | Status: AC
  Filled 2017-07-21: qty 2

## 2017-07-21 MED ORDER — DEXAMETHASONE SODIUM PHOSPHATE 10 MG/ML IJ SOLN
INTRAMUSCULAR | Status: AC
  Filled 2017-07-21: qty 1

## 2017-07-21 MED ORDER — LACTATED RINGERS IV SOLN
INTRAVENOUS | Status: DC
Start: 2017-07-21 — End: 2017-07-21
  Administered 2017-07-21 (×2): via INTRAVENOUS

## 2017-07-21 MED ORDER — HYDROMORPHONE HCL 1 MG/ML IJ SOLN
INTRAMUSCULAR | Status: AC
  Filled 2017-07-21: qty 0.5

## 2017-07-21 MED ORDER — PROMETHAZINE HCL 25 MG/ML IJ SOLN: 6.2500 mg | INTRAMUSCULAR | Status: DC | PRN

## 2017-07-21 MED ORDER — LIDOCAINE 2% (20 MG/ML) 5 ML SYRINGE
INTRAMUSCULAR | Status: AC
  Filled 2017-07-21: qty 5

## 2017-07-21 MED ORDER — OXYCODONE HCL 5 MG PO TABS: 5.0000 mg | ORAL_TABLET | ORAL | 0 refills | Status: DC | PRN

## 2017-07-21 MED ORDER — BUPIVACAINE-EPINEPHRINE 0.25% -1:200000 IJ SOLN
INTRAMUSCULAR | Status: DC | PRN
  Administered 2017-07-21: 15 mL

## 2017-07-21 MED ORDER — HYDROMORPHONE HCL 1 MG/ML IJ SOLN
0.2500 mg | INTRAMUSCULAR | Status: DC | PRN
  Administered 2017-07-21 (×2): 0.5 mg via INTRAVENOUS

## 2017-07-21 MED ORDER — SCOPOLAMINE 1 MG/3DAYS TD PT72: 1.0000 | MEDICATED_PATCH | Freq: Once | TRANSDERMAL | Status: DC | PRN

## 2017-07-21 MED ORDER — CEFAZOLIN SODIUM-DEXTROSE 2-4 GM/100ML-% IV SOLN
2.0000 g | INTRAVENOUS | Status: AC
  Administered 2017-07-21: 2 g via INTRAVENOUS

## 2017-07-21 MED ORDER — MIDAZOLAM HCL 2 MG/2ML IJ SOLN
1.0000 mg | INTRAMUSCULAR | Status: DC | PRN
  Administered 2017-07-21: 2 mg via INTRAVENOUS

## 2017-07-21 MED ORDER — EPHEDRINE 5 MG/ML INJ
INTRAVENOUS | Status: AC
  Filled 2017-07-21: qty 10

## 2017-07-21 MED ORDER — ONDANSETRON HCL 4 MG/2ML IJ SOLN
INTRAMUSCULAR | Status: DC | PRN
  Administered 2017-07-21: 4 mg via INTRAVENOUS

## 2017-07-21 MED ORDER — SUGAMMADEX SODIUM 500 MG/5ML IV SOLN
INTRAVENOUS | Status: DC | PRN
Start: 2017-07-21 — End: 2017-07-21
  Administered 2017-07-21: 200.4 mg via INTRAVENOUS

## 2017-07-21 MED ORDER — ROCURONIUM BROMIDE 100 MG/10ML IV SOLN
INTRAVENOUS | Status: DC | PRN
  Administered 2017-07-21: 50 mg via INTRAVENOUS

## 2017-07-21 MED ORDER — PHENYLEPHRINE 40 MCG/ML (10ML) SYRINGE FOR IV PUSH (FOR BLOOD PRESSURE SUPPORT)
PREFILLED_SYRINGE | INTRAVENOUS | Status: AC
  Filled 2017-07-21: qty 10

## 2017-07-21 MED ORDER — BACITRACIN-NEOMYCIN-POLYMYXIN OINTMENT TUBE
TOPICAL_OINTMENT | CUTANEOUS | Status: DC | PRN
  Administered 2017-07-21: 1 via TOPICAL

## 2017-07-21 MED ORDER — SUCCINYLCHOLINE CHLORIDE 200 MG/10ML IV SOSY
PREFILLED_SYRINGE | INTRAVENOUS | Status: AC
  Filled 2017-07-21: qty 10

## 2017-07-21 MED ORDER — FENTANYL CITRATE (PF) 100 MCG/2ML IJ SOLN
50.0000 ug | INTRAMUSCULAR | Status: AC | PRN
  Administered 2017-07-21 (×2): 50 ug via INTRAVENOUS
  Administered 2017-07-21: 100 ug via INTRAVENOUS

## 2017-07-21 MED ORDER — CEFAZOLIN SODIUM-DEXTROSE 2-4 GM/100ML-% IV SOLN
INTRAVENOUS | Status: AC
  Filled 2017-07-21: qty 100

## 2017-07-21 SURGICAL SUPPLY — 76 items
ADH SKN CLS APL DERMABOND .7 (GAUZE/BANDAGES/DRESSINGS)
APL SKNCLS STERI-STRIP NONHPOA (GAUZE/BANDAGES/DRESSINGS)
BENZOIN TINCTURE PRP APPL 2/3 (GAUZE/BANDAGES/DRESSINGS) IMPLANT
BLADE CLIPPER SURG (BLADE) IMPLANT
BLADE SURG 11 STRL SS (BLADE) IMPLANT
BLADE SURG 15 STRL LF DISP TIS (BLADE) ×1 IMPLANT
BLADE SURG 15 STRL SS (BLADE) ×6
CANISTER SUCT 1200ML W/VALVE (MISCELLANEOUS) ×2 IMPLANT
CHLORAPREP W/TINT 26ML (MISCELLANEOUS) ×1 IMPLANT
CLOSURE WOUND 1/2 X4 (GAUZE/BANDAGES/DRESSINGS)
COVER BACK TABLE 60X90IN (DRAPES) ×3 IMPLANT
COVER MAYO STAND STRL (DRAPES) ×3 IMPLANT
DERMABOND ADVANCED (GAUZE/BANDAGES/DRESSINGS)
DERMABOND ADVANCED .7 DNX12 (GAUZE/BANDAGES/DRESSINGS) IMPLANT
DRAIN JP 10F RND SILICONE (MISCELLANEOUS) IMPLANT
DRAPE LAPAROTOMY 100X72 PEDS (DRAPES) IMPLANT
DRAPE U-SHAPE 76X120 STRL (DRAPES) ×2 IMPLANT
DRSG PAD ABDOMINAL 8X10 ST (GAUZE/BANDAGES/DRESSINGS) ×2 IMPLANT
DRSG TELFA 3X8 NADH (GAUZE/BANDAGES/DRESSINGS) IMPLANT
ELECT COATED BLADE 2.86 ST (ELECTRODE) ×2 IMPLANT
ELECT NDL BLADE 2-5/6 (NEEDLE) ×1 IMPLANT
ELECT NEEDLE BLADE 2-5/6 (NEEDLE) ×3 IMPLANT
ELECT REM PT RETURN 9FT ADLT (ELECTROSURGICAL) ×3
ELECT REM PT RETURN 9FT PED (ELECTROSURGICAL)
ELECTRODE REM PT RETRN 9FT PED (ELECTROSURGICAL) IMPLANT
ELECTRODE REM PT RTRN 9FT ADLT (ELECTROSURGICAL) IMPLANT
EVACUATOR SILICONE 100CC (DRAIN) IMPLANT
GAUZE SPONGE 4X4 12PLY STRL LF (GAUZE/BANDAGES/DRESSINGS) IMPLANT
GAUZE XEROFORM 1X8 LF (GAUZE/BANDAGES/DRESSINGS) IMPLANT
GLOVE BIO SURGEON STRL SZ 6 (GLOVE) ×3 IMPLANT
GLOVE BIOGEL PI IND STRL 7.0 (GLOVE) IMPLANT
GLOVE BIOGEL PI INDICATOR 7.0 (GLOVE) ×2
GLOVE ECLIPSE 6.5 STRL STRAW (GLOVE) ×2 IMPLANT
GLOVE EXAM NITRILE MD LF STRL (GLOVE) ×2 IMPLANT
GOWN STRL REUS W/ TWL LRG LVL3 (GOWN DISPOSABLE) ×2 IMPLANT
GOWN STRL REUS W/TWL LRG LVL3 (GOWN DISPOSABLE) ×6
NDL HYPO 30GX1 BEV (NEEDLE) IMPLANT
NDL PRECISIONGLIDE 27X1.5 (NEEDLE) ×1 IMPLANT
NEEDLE HYPO 30GX1 BEV (NEEDLE) IMPLANT
NEEDLE PRECISIONGLIDE 27X1.5 (NEEDLE) ×3 IMPLANT
NS IRRIG 1000ML POUR BTL (IV SOLUTION) ×2 IMPLANT
PACK BASIN DAY SURGERY FS (CUSTOM PROCEDURE TRAY) ×3 IMPLANT
PAD DRESSING TELFA 3X8 NADH (GAUZE/BANDAGES/DRESSINGS) IMPLANT
PENCIL BUTTON HOLSTER BLD 10FT (ELECTRODE) ×3 IMPLANT
RUBBERBAND STERILE (MISCELLANEOUS) IMPLANT
SHEET MEDIUM DRAPE 40X70 STRL (DRAPES) IMPLANT
SLEEVE SCD COMPRESS KNEE MED (MISCELLANEOUS) ×2 IMPLANT
SPONGE GAUZE 2X2 8PLY STER LF (GAUZE/BANDAGES/DRESSINGS)
SPONGE GAUZE 2X2 8PLY STRL LF (GAUZE/BANDAGES/DRESSINGS) IMPLANT
SPONGE LAP 18X18 X RAY DECT (DISPOSABLE) ×2 IMPLANT
STAPLER VISISTAT 35W (STAPLE) ×3 IMPLANT
STRIP CLOSURE SKIN 1/2X4 (GAUZE/BANDAGES/DRESSINGS) IMPLANT
SUCTION FRAZIER HANDLE 10FR (MISCELLANEOUS) ×2
SUCTION TUBE FRAZIER 10FR DISP (MISCELLANEOUS) IMPLANT
SUT ETHILON 4 0 PS 2 18 (SUTURE) IMPLANT
SUT MNCRL AB 4-0 PS2 18 (SUTURE) IMPLANT
SUT MON AB 5-0 P3 18 (SUTURE) IMPLANT
SUT PDS 3-0 CT2 (SUTURE) ×6
SUT PDS AB 2-0 CT2 27 (SUTURE) ×2 IMPLANT
SUT PDS II 3-0 CT2 27 ABS (SUTURE) IMPLANT
SUT PLAIN 5 0 P 3 18 (SUTURE) IMPLANT
SUT PROLENE 3 0 PS 2 (SUTURE) ×4 IMPLANT
SUT PROLENE 4 0 PS 2 18 (SUTURE) ×2 IMPLANT
SUT PROLENE 5 0 P 3 (SUTURE) IMPLANT
SUT PROLENE 6 0 P 1 18 (SUTURE) IMPLANT
SUT VICRYL 4-0 PS2 18IN ABS (SUTURE) IMPLANT
SWAB COLLECTION DEVICE MRSA (MISCELLANEOUS) IMPLANT
SWAB CULTURE ESWAB REG 1ML (MISCELLANEOUS) IMPLANT
SYR BULB 3OZ (MISCELLANEOUS) ×2 IMPLANT
SYR CONTROL 10ML LL (SYRINGE) ×3 IMPLANT
TAPE CLOTH SURG 4X10 WHT LF (GAUZE/BANDAGES/DRESSINGS) ×2 IMPLANT
TOWEL OR 17X24 6PK STRL BLUE (TOWEL DISPOSABLE) ×3 IMPLANT
TRAY DSU PREP LF (CUSTOM PROCEDURE TRAY) ×2 IMPLANT
TUBE CONNECTING 20'X1/4 (TUBING) ×1
TUBE CONNECTING 20X1/4 (TUBING) ×1 IMPLANT
YANKAUER SUCT BULB TIP 10FT TU (MISCELLANEOUS) ×2 IMPLANT

## 2017-07-21 NOTE — H&P (Signed)
Subjective:     Patient ID: Matthew Black is a 41 y.o. male.  HPI  Referred by Dr. Kae Heller for evaluation mass neck present since 2014 or 2015. Reports started as pea sized area. He tried to express this manually and used needle- following this experienced rapid growth. Has had attempt at opening and leaving open to granulate, opening and leaving penrose with recurrence over last year. Notes presently has swelling and drainage every 10-14 d. Since last exam had at least one ED visit for this, ED provider declined I&D due to scheduled surgery. Bactrim given. However patient had to reschedule surgery as he was in [place] on [place] for anniversary.  Patient is Clinical biochemist.    Review of Systems 12 point review negative    Objective:   Physical Exam  Cardiovascular: Normal rate, regular rhythm and normal heart sounds.   Pulmonary/Chest: Effort normal and breath sounds normal.  HEENT: posterior neck with 7 cm transverse scar at base of hairline, multiple nodular areas surrounding scar line     Assessment:     Acne keloidalis nuchae    Plan:     Appearance /location consistent with mild acne keloidalis nuchae. Reviewed benign nature, alternative treatments including wide excision, antibiotics, steroids. Recommend excision of entire area and closure. Reviewed this will likely be larger area of excision than he has had, will not be able to move neck fully for several weeks. Recommend cover at work, and will pretreat with antibiotics to reduce inflammation..  Reviewed OP surgery, no drains anticipated, sutures, post procedure limitations.

## 2017-07-21 NOTE — Anesthesia Postprocedure Evaluation (Signed)
Anesthesia Post Note  Patient: Matthew Black  Procedure(s) Performed: Procedure(s) (LRB): EXCISION OF BENIGN NECK LESION WITH LAYERED CLOSURE (N/A)     Patient location during evaluation: PACU Anesthesia Type: General Level of consciousness: sedated Pain management: pain level controlled Vital Signs Assessment: post-procedure vital signs reviewed and stable Respiratory status: spontaneous breathing and respiratory function stable Cardiovascular status: stable Anesthetic complications: no    Last Vitals:  Vitals:   07/21/17 1000 07/21/17 1015  BP: 116/88 119/81  Pulse: 72 (!) 58  Resp: 16 13  Temp:    SpO2: 100% 100%    Last Pain:  Vitals:   07/21/17 1015  TempSrc:   PainSc: 6                  Camyla Camposano DANIEL

## 2017-07-21 NOTE — Transfer of Care (Signed)
Immediate Anesthesia Transfer of Care Note  Patient: Matthew Black  Procedure(s) Performed: Procedure(s): EXCISION OF BENIGN NECK LESION WITH LAYERED CLOSURE (N/A)  Patient Location: PACU  Anesthesia Type:General  Level of Consciousness: awake, alert  and oriented  Airway & Oxygen Therapy: Patient Spontanous Breathing and Patient connected to face mask oxygen  Post-op Assessment: Report given to RN and Post -op Vital signs reviewed and stable  Post vital signs: Reviewed and stable  Last Vitals:  Vitals:   07/21/17 0638 07/21/17 0903  BP: 115/73   Pulse: 63 72  Resp: 18 (!) 21  Temp: 36.6 C   SpO2: 100% 100%    Last Pain:  Vitals:   07/21/17 0638  TempSrc: Oral  PainSc: 5       Patients Stated Pain Goal: 2 (07/62/26 3335)  Complications: No apparent anesthesia complications

## 2017-07-21 NOTE — Op Note (Signed)
Operative Note   DATE OF OPERATION: 8.28.18  LOCATION: [facility]-outpatient  SURGICAL DIVISION: Plastic Surgery  PREOPERATIVE DIAGNOSES:  Acne keloidalis nuchae  POSTOPERATIVE DIAGNOSES:  same  PROCEDURE:  1. Excision benign lesion scalp 5 cm 2. Layered closure scalp 13 cm  SURGEON: Irene Limbo MD MBA  ASSISTANT: none  ANESTHESIA:  General.   EBL: 25 ml  COMPLICATIONS: None immediate.   INDICATIONS FOR PROCEDURE:  The patient, Matthew Black, is a 41 y.o. male born on 02/06/1976, is here for lesion posterior scalp associated with long standing abscess that has failed incision and drainage consistent with acne keloidalis nuchae.   FINDINGS: Chronic abscess cavity beneath scalp with hypergranulation tissue, pseudo epithelization and multiple hairs present.  DESCRIPTION OF PROCEDURE: The patient's operative site was marked with the patient in the preoperative area. The patientwas taken to the operating room. SCDs were placed and IV antibiotics were given.The patient was placed in prone position.The patient's operative site was prepped and draped in a sterile fashion. A time out was performed and all information was confirmed to be correct.Local anesthetic infiltrated surrounding mass. Sharp excision made over posterior scalp inferior to mass including prior scars. This was carried to subcutaneous fat. Incision then made at cepahlic border of mass and grossly involved scalp. Incision carried to normal appearing subcutaneous tissue. Abscess cavity within keloid like mass noted and entirety cavity excised en bloc.The keloid like mass excised, measuring 5 x 13 cm. Adjacent neck and scalp elevated in subcutaneous plane to aid with closure. Wound irrigated and hemostasis obtained. Additional local anesthetic placed in base of wound. Layered closure completed with 2-0 and 3-0 PDS in dermis and superficial fascia. Skin closure completed with 3-0 and 4-0 prolene in short running and  interrupted horizontal mattress and simple interrupted fashion, length 13 cm. Antibiotic ointment applied and patient returned to supine position.   The patient was allowed to wake from anesthesia, extubated and taken to the recovery room in satisfactory condition.   SPECIMENS: acne keloidalis nuchae   DRAINS: none  Irene Limbo, MD [facility] Plastic & Reconstructive Surgery [phone], pin [phone]

## 2017-07-21 NOTE — Discharge Instructions (Signed)

## 2017-07-21 NOTE — Anesthesia Procedure Notes (Signed)
Procedure Name: Intubation Date/Time: 07/21/2017 7:33 AM Performed by: Melynda Ripple D Pre-anesthesia Checklist: Patient identified, Emergency Drugs available, Suction available and Patient being monitored Patient Re-evaluated:Patient Re-evaluated prior to induction Oxygen Delivery Method: Circle system utilized Preoxygenation: Pre-oxygenation with 100% oxygen Induction Type: IV induction Ventilation: Mask ventilation without difficulty Laryngoscope Size: Mac and 3 Grade View: Grade I Tube type: Oral Tube size: 7.0 mm Number of attempts: 1 Airway Equipment and Method: Stylet and Oral airway Placement Confirmation: ETT inserted through vocal cords under direct vision,  positive ETCO2 and breath sounds checked- equal and bilateral Tube secured with: Tape Dental Injury: Teeth and Oropharynx as per pre-operative assessment

## 2017-07-22 ENCOUNTER — Encounter (HOSPITAL_BASED_OUTPATIENT_CLINIC_OR_DEPARTMENT_OTHER): Payer: Self-pay | Admitting: Plastic Surgery

## 2017-11-04 NOTE — H&P (Signed)
  Subjective:     Patient ID: Matthew Black is a 41 y.o. male.  Follow-up    A little over 3 months post op excision acne keloidalis nuchae. Experienced seroma right lateral extent excision and has experienced continued drainage from this area. Notes swollen and drained over weekend. Also notes nodular area has developed left lateral extent scar, no drainage from this.  From initial consult: "Referred by Dr. Kae Heller for evaluation mass neck present since 2014 or 2015. Reports started as pea sized area. He tried to express this manually and used needle- following this experienced rapid growth. Has had attempt at opening and leaving open to granulate, opening and leaving penrose with recurrence over last year. Notes presently has swelling and drainage every 10-14 d."      Objective:   Physical Exam  Cardiovascular: Normal rate, regular rhythm and normal heart sounds.   Pulmonary/Chest: Effort normal and breath sounds normal.     HEENT: scar maturing, right lateral extent incision with able to express serous material, NTTP, no cellulitis  appr 3 cm of induration Left lateral extent scar with 1 cm area of swelling within scar line, no drainage or erythema Assessment:     Acne keloidalis nuchae s/p excision Chronic drainage lateral extent wound    Plan:      Overall improved, but chronic drainage, suspect chronic seroma cavity vs residual disease. Plan re exicision of this area. Plan to do as OP surgery, [place]. Would like to be healed before his birthday which he plans to spend in [place], so will plan procedure in next 1-2 weeks.  Irene Limbo, MD [facility] Plastic & Reconstructive Surgery [phone], pin [phone]

## 2017-11-05 ENCOUNTER — Other Ambulatory Visit: Payer: Self-pay

## 2017-11-05 ENCOUNTER — Encounter (HOSPITAL_BASED_OUTPATIENT_CLINIC_OR_DEPARTMENT_OTHER): Payer: Self-pay | Admitting: *Deleted

## 2017-11-10 ENCOUNTER — Encounter (HOSPITAL_BASED_OUTPATIENT_CLINIC_OR_DEPARTMENT_OTHER)
Admission: RE | Disposition: A | Discharge status: Home / Self Care | Payer: Self-pay | Source: Ambulatory Visit | Attending: Plastic Surgery

## 2017-11-10 ENCOUNTER — Ambulatory Visit (HOSPITAL_BASED_OUTPATIENT_CLINIC_OR_DEPARTMENT_OTHER): Payer: BLUE CROSS/BLUE SHIELD | Admitting: Anesthesiology

## 2017-11-10 ENCOUNTER — Ambulatory Visit (HOSPITAL_BASED_OUTPATIENT_CLINIC_OR_DEPARTMENT_OTHER)
Admission: RE | Admit: 2017-11-10 | Discharge: 2017-11-10 | Disposition: A | Discharge status: Home / Self Care | Payer: BLUE CROSS/BLUE SHIELD | Source: Ambulatory Visit | Attending: Plastic Surgery | Admitting: Plastic Surgery

## 2017-11-10 ENCOUNTER — Other Ambulatory Visit: Payer: Self-pay

## 2017-11-10 ENCOUNTER — Encounter (HOSPITAL_BASED_OUTPATIENT_CLINIC_OR_DEPARTMENT_OTHER): Payer: Self-pay | Admitting: Anesthesiology

## 2017-11-10 DIAGNOSIS — Z885 Allergy status to narcotic agent status: Secondary | ICD-10-CM | POA: Diagnosis not present

## 2017-11-10 DIAGNOSIS — Z87891 Personal history of nicotine dependence: Secondary | ICD-10-CM | POA: Insufficient documentation

## 2017-11-10 DIAGNOSIS — L730 Acne keloid: Secondary | ICD-10-CM | POA: Diagnosis not present

## 2017-11-10 HISTORY — PX: MASS EXCISION: SHX2000

## 2017-11-10 SURGERY — EXCISION MASS
Anesthesia: General | Site: Neck

## 2017-11-10 MED ORDER — OXYCODONE HCL 5 MG PO TABS: 5.0000 mg | ORAL_TABLET | ORAL | 0 refills | Status: AC | PRN

## 2017-11-10 MED ORDER — SUCCINYLCHOLINE CHLORIDE 20 MG/ML IJ SOLN
INTRAMUSCULAR | Status: DC | PRN
  Administered 2017-11-10: 50 mg via INTRAVENOUS

## 2017-11-10 MED ORDER — LACTATED RINGERS IV SOLN: INTRAVENOUS | Status: DC

## 2017-11-10 MED ORDER — FENTANYL CITRATE (PF) 100 MCG/2ML IJ SOLN
INTRAMUSCULAR | Status: AC
  Filled 2017-11-10: qty 2

## 2017-11-10 MED ORDER — LACTATED RINGERS IV SOLN
INTRAVENOUS | Status: DC
Start: 2017-11-10 — End: 2017-11-10
  Administered 2017-11-10 (×2): via INTRAVENOUS

## 2017-11-10 MED ORDER — MIDAZOLAM HCL 2 MG/2ML IJ SOLN
INTRAMUSCULAR | Status: AC
  Filled 2017-11-10: qty 2

## 2017-11-10 MED ORDER — DOXYCYCLINE HYCLATE 50 MG PO CAPS
50.0000 mg | ORAL_CAPSULE | Freq: Two times a day (BID) | ORAL | 0 refills | Status: DC
Start: 2017-11-10 — End: 2019-01-23

## 2017-11-10 MED ORDER — OXYCODONE HCL 5 MG PO TABS
5.0000 mg | ORAL_TABLET | Freq: Once | ORAL | Status: AC | PRN
  Administered 2017-11-10: 5 mg via ORAL

## 2017-11-10 MED ORDER — FENTANYL CITRATE (PF) 100 MCG/2ML IJ SOLN
INTRAMUSCULAR | Status: DC | PRN
  Administered 2017-11-10 (×2): 25 ug via INTRAVENOUS
  Administered 2017-11-10: 100 ug via INTRAVENOUS
  Administered 2017-11-10 (×3): 25 ug via INTRAVENOUS

## 2017-11-10 MED ORDER — DEXAMETHASONE SODIUM PHOSPHATE 4 MG/ML IJ SOLN
INTRAMUSCULAR | Status: DC | PRN
  Administered 2017-11-10: 10 mg via INTRAVENOUS

## 2017-11-10 MED ORDER — FENTANYL CITRATE (PF) 100 MCG/2ML IJ SOLN
50.0000 ug | INTRAMUSCULAR | Status: DC | PRN
Start: 2017-11-10 — End: 2017-11-10

## 2017-11-10 MED ORDER — MIDAZOLAM HCL 2 MG/2ML IJ SOLN
1.0000 mg | INTRAMUSCULAR | Status: DC | PRN
Start: 2017-11-10 — End: 2017-11-10

## 2017-11-10 MED ORDER — PROMETHAZINE HCL 25 MG/ML IJ SOLN: 6.2500 mg | INTRAMUSCULAR | Status: DC | PRN

## 2017-11-10 MED ORDER — PROPOFOL 10 MG/ML IV BOLUS
INTRAVENOUS | Status: DC | PRN
  Administered 2017-11-10: 50 mg via INTRAVENOUS

## 2017-11-10 MED ORDER — ONDANSETRON HCL 4 MG/2ML IJ SOLN
INTRAMUSCULAR | Status: DC | PRN
  Administered 2017-11-10: 4 mg via INTRAVENOUS

## 2017-11-10 MED ORDER — OXYCODONE HCL 5 MG PO TABS
ORAL_TABLET | ORAL | Status: AC
  Filled 2017-11-10: qty 1

## 2017-11-10 MED ORDER — CEFAZOLIN SODIUM-DEXTROSE 2-4 GM/100ML-% IV SOLN
2.0000 g | INTRAVENOUS | Status: AC
  Administered 2017-11-10: 2 g via INTRAVENOUS

## 2017-11-10 MED ORDER — PROPOFOL 10 MG/ML IV BOLUS
INTRAVENOUS | Status: AC
  Filled 2017-11-10: qty 20

## 2017-11-10 MED ORDER — MEPERIDINE HCL 25 MG/ML IJ SOLN: 6.2500 mg | INTRAMUSCULAR | Status: DC | PRN

## 2017-11-10 MED ORDER — BACITRACIN ZINC 500 UNIT/GM EX OINT
TOPICAL_OINTMENT | CUTANEOUS | Status: AC
  Filled 2017-11-10: qty 2.7

## 2017-11-10 MED ORDER — LIDOCAINE HCL (CARDIAC) 20 MG/ML IV SOLN
INTRAVENOUS | Status: DC | PRN
Start: 2017-11-10 — End: 2017-11-10
  Administered 2017-11-10: 30 mg via INTRAVENOUS

## 2017-11-10 MED ORDER — CEFAZOLIN SODIUM-DEXTROSE 2-4 GM/100ML-% IV SOLN
INTRAVENOUS | Status: AC
  Filled 2017-11-10: qty 100

## 2017-11-10 MED ORDER — MIDAZOLAM HCL 5 MG/5ML IJ SOLN
INTRAMUSCULAR | Status: DC | PRN
  Administered 2017-11-10: 2 mg via INTRAVENOUS

## 2017-11-10 MED ORDER — BUPIVACAINE-EPINEPHRINE 0.25% -1:200000 IJ SOLN
INTRAMUSCULAR | Status: DC | PRN
  Administered 2017-11-10: 2 mL
  Administered 2017-11-10: 9 mL

## 2017-11-10 MED ORDER — HYDROMORPHONE HCL 1 MG/ML IJ SOLN: 0.2500 mg | INTRAMUSCULAR | Status: DC | PRN

## 2017-11-10 MED ORDER — SCOPOLAMINE 1 MG/3DAYS TD PT72: 1.0000 | MEDICATED_PATCH | Freq: Once | TRANSDERMAL | Status: DC | PRN

## 2017-11-10 MED ORDER — OXYCODONE HCL 5 MG/5ML PO SOLN: 5.0000 mg | Freq: Once | ORAL | Status: AC | PRN

## 2017-11-10 MED ORDER — BACITRACIN 500 UNIT/GM EX OINT
TOPICAL_OINTMENT | CUTANEOUS | Status: DC | PRN
  Administered 2017-11-10: 1 via TOPICAL

## 2017-11-10 SURGICAL SUPPLY — 72 items
ADH SKN CLS APL DERMABOND .7 (GAUZE/BANDAGES/DRESSINGS)
APL SKNCLS STERI-STRIP NONHPOA (GAUZE/BANDAGES/DRESSINGS)
BENZOIN TINCTURE PRP APPL 2/3 (GAUZE/BANDAGES/DRESSINGS) IMPLANT
BLADE CLIPPER SURG (BLADE) ×1 IMPLANT
BLADE SURG 10 STRL SS (BLADE) ×1 IMPLANT
BLADE SURG 11 STRL SS (BLADE) IMPLANT
BLADE SURG 15 STRL LF DISP TIS (BLADE) ×1 IMPLANT
BLADE SURG 15 STRL SS (BLADE) ×4
BNDG COHESIVE 3X5 TAN STRL LF (GAUZE/BANDAGES/DRESSINGS) ×1 IMPLANT
CANISTER SUCT 1200ML W/VALVE (MISCELLANEOUS) ×1 IMPLANT
CHLORAPREP W/TINT 26ML (MISCELLANEOUS) ×2 IMPLANT
COVER BACK TABLE 60X90IN (DRAPES) ×2 IMPLANT
COVER MAYO STAND STRL (DRAPES) ×2 IMPLANT
DERMABOND ADVANCED (GAUZE/BANDAGES/DRESSINGS)
DERMABOND ADVANCED .7 DNX12 (GAUZE/BANDAGES/DRESSINGS) IMPLANT
DRAIN JP 10F RND SILICONE (MISCELLANEOUS) IMPLANT
DRAPE LAPAROTOMY 100X72 PEDS (DRAPES) ×1 IMPLANT
DRAPE U-SHAPE 76X120 STRL (DRAPES) IMPLANT
DRSG MEPILEX BORDER 4X8 (GAUZE/BANDAGES/DRESSINGS) ×3 IMPLANT
DRSG TELFA 3X8 NADH (GAUZE/BANDAGES/DRESSINGS) IMPLANT
ELECT COATED BLADE 2.86 ST (ELECTRODE) IMPLANT
ELECT NDL BLADE 2-5/6 (NEEDLE) ×1 IMPLANT
ELECT NEEDLE BLADE 2-5/6 (NEEDLE) ×2 IMPLANT
ELECT REM PT RETURN 9FT ADLT (ELECTROSURGICAL) ×2
ELECT REM PT RETURN 9FT PED (ELECTROSURGICAL)
ELECTRODE REM PT RETRN 9FT PED (ELECTROSURGICAL) IMPLANT
ELECTRODE REM PT RTRN 9FT ADLT (ELECTROSURGICAL) IMPLANT
EVACUATOR SILICONE 100CC (DRAIN) IMPLANT
GAUZE SPONGE 4X4 12PLY STRL LF (GAUZE/BANDAGES/DRESSINGS) IMPLANT
GAUZE XEROFORM 1X8 LF (GAUZE/BANDAGES/DRESSINGS) IMPLANT
GLOVE BIO SURGEON STRL SZ 6 (GLOVE) ×2 IMPLANT
GLOVE BIOGEL PI IND STRL 7.0 (GLOVE) IMPLANT
GLOVE BIOGEL PI INDICATOR 7.0 (GLOVE) ×2
GLOVE ECLIPSE 6.5 STRL STRAW (GLOVE) ×1 IMPLANT
GOWN STRL REUS W/ TWL LRG LVL3 (GOWN DISPOSABLE) ×2 IMPLANT
GOWN STRL REUS W/TWL LRG LVL3 (GOWN DISPOSABLE) ×4
NDL HYPO 30GX1 BEV (NEEDLE) IMPLANT
NDL PRECISIONGLIDE 27X1.5 (NEEDLE) ×1 IMPLANT
NEEDLE HYPO 30GX1 BEV (NEEDLE) IMPLANT
NEEDLE PRECISIONGLIDE 27X1.5 (NEEDLE) ×2 IMPLANT
NS IRRIG 1000ML POUR BTL (IV SOLUTION) ×1 IMPLANT
PACK BASIN DAY SURGERY FS (CUSTOM PROCEDURE TRAY) ×2 IMPLANT
PAD DRESSING TELFA 3X8 NADH (GAUZE/BANDAGES/DRESSINGS) IMPLANT
PENCIL BUTTON HOLSTER BLD 10FT (ELECTRODE) ×2 IMPLANT
RUBBERBAND STERILE (MISCELLANEOUS) IMPLANT
SHEET MEDIUM DRAPE 40X70 STRL (DRAPES) IMPLANT
SLEEVE SCD COMPRESS KNEE MED (MISCELLANEOUS) ×1 IMPLANT
SPONGE GAUZE 2X2 8PLY STRL LF (GAUZE/BANDAGES/DRESSINGS) IMPLANT
SPONGE LAP 18X18 X RAY DECT (DISPOSABLE) ×1 IMPLANT
STAPLER VISISTAT 35W (STAPLE) ×2 IMPLANT
STRIP CLOSURE SKIN 1/2X4 (GAUZE/BANDAGES/DRESSINGS) IMPLANT
SUCTION FRAZIER HANDLE 10FR (MISCELLANEOUS)
SUCTION TUBE FRAZIER 10FR DISP (MISCELLANEOUS) IMPLANT
SUT ETHILON 4 0 PS 2 18 (SUTURE) IMPLANT
SUT MNCRL AB 4-0 PS2 18 (SUTURE) IMPLANT
SUT MON AB 5-0 P3 18 (SUTURE) IMPLANT
SUT PDS 3-0 CT2 (SUTURE) ×2
SUT PDS AB 2-0 CT2 27 (SUTURE) ×1 IMPLANT
SUT PDS II 3-0 CT2 27 ABS (SUTURE) IMPLANT
SUT PLAIN 5 0 P 3 18 (SUTURE) IMPLANT
SUT PROLENE 4 0 PS 2 18 (SUTURE) ×1 IMPLANT
SUT PROLENE 5 0 P 3 (SUTURE) IMPLANT
SUT PROLENE 6 0 P 1 18 (SUTURE) IMPLANT
SUT VICRYL 4-0 PS2 18IN ABS (SUTURE) IMPLANT
SWAB COLLECTION DEVICE MRSA (MISCELLANEOUS) IMPLANT
SWAB CULTURE ESWAB REG 1ML (MISCELLANEOUS) IMPLANT
SYR BULB 3OZ (MISCELLANEOUS) ×1 IMPLANT
SYR CONTROL 10ML LL (SYRINGE) ×2 IMPLANT
TOWEL OR 17X24 6PK STRL BLUE (TOWEL DISPOSABLE) ×3 IMPLANT
TRAY DSU PREP LF (CUSTOM PROCEDURE TRAY) ×1 IMPLANT
TUBE CONNECTING 20X1/4 (TUBING) ×1 IMPLANT
YANKAUER SUCT BULB TIP 10FT TU (MISCELLANEOUS) ×1 IMPLANT

## 2017-11-10 NOTE — Transfer of Care (Signed)
Immediate Anesthesia Transfer of Care Note  Patient: Matthew Black  Procedure(s) Performed: EXCISION BENIGN LESION OF THE NECK WITH LAYERED CLOSURE (N/A Neck)  Patient Location: PACU  Anesthesia Type:General  Level of Consciousness: sedated  Airway & Oxygen Therapy: Patient Spontanous Breathing and Patient connected to face mask oxygen  Post-op Assessment: Report given to RN and Post -op Vital signs reviewed and stable  Post vital signs: Reviewed and stable  Last Vitals:  Vitals:   11/10/17 0808  BP: 113/66  Pulse: 62  Resp: 18  Temp: 36.6 C  SpO2: 100%    Last Pain:  Vitals:   11/10/17 0808  TempSrc: Oral  PainSc: 0-No pain      Patients Stated Pain Goal: 0 (17/61/60 7371)  Complications: No apparent anesthesia complications

## 2017-11-10 NOTE — Discharge Instructions (Signed)

## 2017-11-10 NOTE — Anesthesia Preprocedure Evaluation (Addendum)
Anesthesia Evaluation  Patient identified by MRN, date of birth, ID band Patient awake    Reviewed: Allergy & Precautions, NPO status , Patient's Chart, lab work & pertinent test results  Airway Mallampati: I  TM Distance: >3 FB Neck ROM: Full    Dental  (+) Teeth Intact, Dental Advisory Given,    Pulmonary former smoker,    breath sounds clear to auscultation       Cardiovascular negative cardio ROS   Rhythm:Regular Rate:Normal     Neuro/Psych negative neurological ROS     GI/Hepatic negative GI ROS, Neg liver ROS,   Endo/Other  negative endocrine ROS  Renal/GU negative Renal ROS     Musculoskeletal negative musculoskeletal ROS (+)   Abdominal Normal abdominal exam  (+)   Peds  Hematology negative hematology ROS (+)   Anesthesia Other Findings Day of surgery medications reviewed with the patient.  Reproductive/Obstetrics                          Anesthesia Physical Anesthesia Plan  ASA: II  Anesthesia Plan: General   Post-op Pain Management:    Induction: Intravenous  PONV Risk Score and Plan: 3 and Ondansetron, Dexamethasone and Midazolam  Airway Management Planned: Oral ETT  Additional Equipment:   Intra-op Plan:   Post-operative Plan: Extubation in OR  Informed Consent: I have reviewed the patients History and Physical, chart, labs and discussed the procedure including the risks, benefits and alternatives for the proposed anesthesia with the patient or authorized representative who has indicated his/her understanding and acceptance.   Dental advisory given  Plan Discussed with: CRNA  Anesthesia Plan Comments:         Anesthesia Quick Evaluation

## 2017-11-10 NOTE — Op Note (Signed)
Operative Note   DATE OF OPERATION: 12.18.18  LOCATION: [facility]-outpatient  SURGICAL DIVISION: Plastic Surgery  PREOPERATIVE DIAGNOSES:  Acne keloidalis nuchae  POSTOPERATIVE DIAGNOSES:  same  PROCEDURE:  1. Excision benign lesion scalp 1 cm 2. Layered closure scalp 10cm  SURGEON: Irene Limbo MD MBA  ASSISTANT: none  ANESTHESIA:  General.   EBL: 50 ml  COMPLICATIONS: None immediate.   INDICATIONS FOR PROCEDURE:  The patient, Matthew Black, is a 41 y.o. male born on July 05, 1976, is here chronic drainage from scar line following excision acne keloidalis nuchae four months ago.   FINDINGS: Chronic cavity beneath scalp with hypergranulation tissue, pseudo epithelization present tracking beneath scar line for near entirely scar.  DESCRIPTION OF PROCEDURE: The patient's operative site was marked with the patient in the preoperative area. The patientwas taken to the operating room. SCDs were placed and IV antibiotics were given.The patient was placed in prone position.The patient's operative site was prepped and draped in a sterile fashion. A time out was performed and all information was confirmed to be correct.Local anesthetic infiltrated surrounding scar. Left lateral extent scar excised diameter 1 cm and underlying chronic fluid cavity excised. Wound irrigated. I then directed attention to right lateral extent scar. Sharp excision of scar completed diameter 1 cm. Chronic fluid cavity with hypergranulation tissue noted and this tracked toward left beneath scar line for 8 cm, not communication with other cavity. Scar incised to address this tracking cavity and cavity wall and hypergranulation tissue excised.  Wound irrigated and hemostasis obtained. Additional local anesthetic placed in base of wound. Layered closure completed with 2-0 and 3-0 PDS in dermis and superficial fascia with 3 point closure to base of wound. Skin closure completed with short running 4-0 prolene   length 10 cm. Antibiotic ointment applied and patient returned to supine position.   The patient was allowed to wake from anesthesia, extubated and taken to the recovery room in satisfactory condition.   SPECIMENS: acne keloidalis nuchae   DRAINS: none  Irene Limbo, MD [facility] Plastic & Reconstructive Surgery [phone], pin [phone]

## 2017-11-10 NOTE — Anesthesia Procedure Notes (Signed)
Procedure Name: Intubation Date/Time: 11/10/2017 8:58 AM Performed by: Marrianne Mood, CRNA Pre-anesthesia Checklist: Patient identified, Emergency Drugs available, Suction available, Patient being monitored and Timeout performed Patient Re-evaluated:Patient Re-evaluated prior to induction Oxygen Delivery Method: Circle system utilized Preoxygenation: Pre-oxygenation with 100% oxygen Induction Type: IV induction Ventilation: Mask ventilation without difficulty Laryngoscope Size: Miller and 3 Grade View: Grade II Tube type: Oral Tube size: 8.0 mm Number of attempts: 1 Airway Equipment and Method: Stylet and Oral airway Placement Confirmation: ETT inserted through vocal cords under direct vision,  positive ETCO2 and breath sounds checked- equal and bilateral Secured at: 24 cm Tube secured with: Tape Dental Injury: Teeth and Oropharynx as per pre-operative assessment

## 2017-11-10 NOTE — Interval H&P Note (Signed)
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 After consideration of risks, benefits and other options for treatment, the patient has consented to  Procedure(s): EXCISION BENIGN LESION OF THE NECK WITH LAYERED CLOSURE (N/A) as a surgical intervention .  The patient's history has been reviewed, patient examined, no change in status, stable for surgery.  I have reviewed the patient's chart and labs.  Questions were answered to the patient's satisfaction.     Valinda Fedie

## 2017-11-10 NOTE — Anesthesia Postprocedure Evaluation (Signed)
Anesthesia Post Note  Patient: Matthew Black  Procedure(s) Performed: EXCISION BENIGN LESION OF THE NECK WITH LAYERED CLOSURE (N/A Neck)     Patient location during evaluation: PACU Anesthesia Type: General Level of consciousness: awake and alert Pain management: pain level controlled Vital Signs Assessment: post-procedure vital signs reviewed and stable Respiratory status: spontaneous breathing, nonlabored ventilation, respiratory function stable and patient connected to nasal cannula oxygen Cardiovascular status: blood pressure returned to baseline and stable Postop Assessment: no apparent nausea or vomiting Anesthetic complications: no    Last Vitals:  Vitals:   11/10/17 1115 11/10/17 1145  BP: 124/87 (!) 140/92  Pulse: 61 74  Resp: 17 18  Temp:  36.6 C  SpO2: 100% 99%    Last Pain:  Vitals:   11/10/17 1213  TempSrc:   PainSc: Warren Briany Aye

## 2017-11-11 ENCOUNTER — Encounter (HOSPITAL_BASED_OUTPATIENT_CLINIC_OR_DEPARTMENT_OTHER): Payer: Self-pay | Admitting: Plastic Surgery

## 2019-01-23 ENCOUNTER — Ambulatory Visit (HOSPITAL_COMMUNITY)
Admission: EM | Admit: 2019-01-23 | Discharge: 2019-01-23 | Disposition: A | Discharge status: Home / Self Care | Payer: HRSA Program | Attending: Family Medicine | Admitting: Family Medicine

## 2019-01-23 ENCOUNTER — Other Ambulatory Visit: Payer: Self-pay

## 2019-01-23 ENCOUNTER — Encounter (HOSPITAL_COMMUNITY): Payer: Self-pay | Admitting: *Deleted

## 2019-01-23 DIAGNOSIS — R05 Cough: Secondary | ICD-10-CM

## 2019-01-23 DIAGNOSIS — J111 Influenza due to unidentified influenza virus with other respiratory manifestations: Secondary | ICD-10-CM

## 2019-01-23 DIAGNOSIS — R0981 Nasal congestion: Secondary | ICD-10-CM

## 2019-01-23 DIAGNOSIS — Z20828 Contact with and (suspected) exposure to other viral communicable diseases: Secondary | ICD-10-CM

## 2019-01-23 DIAGNOSIS — R509 Fever, unspecified: Secondary | ICD-10-CM | POA: Diagnosis not present

## 2019-01-23 DIAGNOSIS — R69 Illness, unspecified: Principal | ICD-10-CM

## 2019-01-23 DIAGNOSIS — R5381 Other malaise: Secondary | ICD-10-CM

## 2019-01-23 MED ORDER — ACETAMINOPHEN 325 MG PO TABS
650.0000 mg | ORAL_TABLET | Freq: Once | ORAL | Status: AC
  Administered 2019-01-23: 650 mg via ORAL

## 2019-01-23 MED ORDER — IBUPROFEN 800 MG PO TABS: 800.0000 mg | ORAL_TABLET | Freq: Three times a day (TID) | ORAL | 0 refills | Status: DC

## 2019-01-23 MED ORDER — OSELTAMIVIR PHOSPHATE 75 MG PO CAPS: 75.0000 mg | ORAL_CAPSULE | Freq: Two times a day (BID) | ORAL | 0 refills | Status: DC

## 2019-01-23 MED ORDER — ACETAMINOPHEN 325 MG PO TABS
ORAL_TABLET | ORAL | Status: AC
  Filled 2019-01-23: qty 2

## 2019-01-23 NOTE — Discharge Instructions (Addendum)
Drink plenty of fluids Take Tamiflu 2 times a day for 5 days Take ibuprofen 3 times a day with food.  This is for pain and fever Return if you get worse instead of better.  Influenza usually last several days

## 2019-01-23 NOTE — ED Triage Notes (Signed)
Reports general malaise, slight cold sxs and chills onset today.  Has not taken any meds.

## 2019-01-23 NOTE — ED Provider Notes (Signed)
[place]    CSN: [id number] Arrival date & time: 01/23/19  1544     History   Chief Complaint Chief Complaint  Patient presents with  . Fever    HPI Matthew Black is a 43 y.o. male.   HPI  69 year old son is recovering at home with influenza.  His test was positive.  He was treated with Tamiflu.  He is here now with 24 hours of fever chills malaise body aches and mild cough.  No chest pain or shortness of breath.  He is otherwise in good health.  Past Medical History:  Diagnosis Date  . 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    Patient Active Problem List   Diagnosis Date Noted  . Annual physical exam 09/11/2016  . Obesity 03/12/2016  . PCP NOTES >>>>>>>>>>>>>>>>>. 03/12/2016  . Scalp abscess 12/20/2015  . Neck abscess 12/20/2015  . Pilonidal cyst 02/08/2013    Past Surgical History:  Procedure Laterality Date  . CYST EXCISION N/A 10/08/2016   Procedure: EXCISION OF POSTERIOR NECK CYST;  Surgeon: Clovis Riley, MD;  Location: [facility];  Service: General;  Laterality: N/A;  . INCISION AND DRAINAGE ABSCESS N/A 09/22/2014   Procedure: INCISION AND DRAINAGE ABSCESS POSTERIOR NECK;  Surgeon: Pedro Earls, MD;  Location: [facility];  Service: General;  Laterality: N/A;  . INCISION AND DRAINAGE ABSCESS N/A 12/20/2015   Procedure: INCISION AND DRAINAGE POSTERIOR NECK MASS;  Surgeon: Armandina Gemma, MD;  Location: [facility];  Service: General;  Laterality: N/A;  . INCISION AND DRAINAGE ABSCESS Left 07/10/2004   middle finger  . MASS EXCISION N/A 07/21/2017   Procedure: EXCISION OF BENIGN NECK LESION WITH LAYERED CLOSURE;  Surgeon: Irene Limbo, MD;  Location: [facility];  Service: Plastics;  Laterality: N/A;  . MASS EXCISION N/A 11/10/2017   Procedure: EXCISION BENIGN LESION OF THE NECK WITH LAYERED CLOSURE;  Surgeon: Irene Limbo, MD;  Location: [place];  Service: Plastics;  Laterality: N/A;       Home Medications    Prior to  Admission medications   Medication Sig Start Date End Date Taking? Authorizing Provider  ibuprofen (ADVIL,MOTRIN) 800 MG tablet Take 1 tablet (800 mg total) by mouth 3 (three) times daily. 01/23/19   Raylene Everts, MD  oseltamivir (TAMIFLU) 75 MG capsule Take 1 capsule (75 mg total) by mouth every 12 (twelve) hours. 01/23/19   Raylene Everts, MD    Family History Family History  Problem Relation Age of Onset  . Diabetes Other        GF  . Healthy Mother   . Healthy Father     Social History Social History   Tobacco Use  . Smoking status: Former Smoker    Packs/day: 0.00    Last attempt to quit: 11/24/2015    Years since quitting: 3.1  . Smokeless tobacco: Never Used  . Tobacco comment:    Substance Use Topics  . Alcohol use: Yes    Comment: occasionally  . Drug use: Never     Allergies   Hydrocodone   Review of Systems Review of Systems   Physical Exam Triage Vital Signs ED Triage Vitals [01/23/19 1603]  Enc Vitals Group     BP 126/81     Pulse Rate 88     Resp 16     Temp (!) 100.5 F (38.1 C)     Temp Source Oral     SpO2 98 %  Weight      Height      Head Circumference      Peak Flow      Pain Score 0     Pain Loc      Pain Edu?      Excl. in [place]?    No data found.  Updated Vital Signs BP 126/81   Pulse 88   Temp (!) 100.5 F (38.1 C) (Oral)   Resp 16   SpO2 98%   Visual Acuity Right Eye Distance:   Left Eye Distance:   Bilateral Distance:    Right Eye Near:   Left Eye Near:    Bilateral Near:     Physical Exam Constitutional:      General: He is not in acute distress.    Appearance: He is well-developed and normal weight. He is ill-appearing and diaphoretic.  HENT:     Head: Normocephalic and atraumatic.     Right Ear: Tympanic membrane and ear canal normal.     Left Ear: Tympanic membrane and ear canal normal.     Nose: Congestion present.     Mouth/Throat:     Pharynx: Posterior oropharyngeal erythema present.    Eyes:     Conjunctiva/sclera: Conjunctivae normal.     Pupils: Pupils are equal, round, and reactive to light.  Neck:     Musculoskeletal: Normal range of motion.  Cardiovascular:     Rate and Rhythm: Normal rate and regular rhythm.     Heart sounds: Normal heart sounds.  Pulmonary:     Effort: Pulmonary effort is normal. No respiratory distress.     Breath sounds: Normal breath sounds.  Abdominal:     General: There is no distension.     Palpations: Abdomen is soft.  Musculoskeletal: Normal range of motion.  Skin:    General: Skin is warm.  Neurological:     Mental Status: He is alert.  Psychiatric:        Mood and Affect: Mood normal.        Behavior: Behavior normal.      UC Treatments / Results  Labs (all labs ordered are listed, but only abnormal results are displayed) Labs Reviewed - No data to display  EKG None  Radiology No results found.  Procedures Procedures (including critical care time)  Medications Ordered in UC Medications  acetaminophen (TYLENOL) tablet 650 mg (650 mg Oral Given 01/23/19 1608)    Initial Impression / Assessment and Plan / UC Course  I have reviewed the triage vital signs and the nursing notes.  Pertinent labs & imaging results that were available during my care of the patient were reviewed by me and considered in my medical decision making (see chart for details).     Discussed influenza.  Respiratory virus with body aches and fever.  Expect improvement over next several days.  Symptomatic care for cough and cold symptoms, fever and body aches.  Push fluids. Final Clinical Impressions(s) / UC Diagnoses   Final diagnoses:  Influenza-like illness     Discharge Instructions     Drink plenty of fluids Take Tamiflu 2 times a day for 5 days Take ibuprofen 3 times a day with food.  This is for pain and fever Return if you get worse instead of better.  Influenza usually last several days   ED Prescriptions    Medication Sig  Dispense Auth. Provider   oseltamivir (TAMIFLU) 75 MG capsule Take 1 capsule (75 mg total) by mouth every  12 (twelve) hours. 10 capsule Raylene Everts, MD   ibuprofen (ADVIL,MOTRIN) 800 MG tablet Take 1 tablet (800 mg total) by mouth 3 (three) times daily. 21 tablet Raylene Everts, MD     Controlled Substance Prescriptions [place] Controlled Substance Registry consulted? Not Applicable   Raylene Everts, MD 01/23/19 [phone]

## 2019-06-29 ENCOUNTER — Encounter: Payer: Self-pay | Admitting: Internal Medicine

## 2019-07-19 ENCOUNTER — Inpatient Hospital Stay (HOSPITAL_COMMUNITY): Payer: Medicaid Other | Admitting: Certified Registered"

## 2019-07-19 ENCOUNTER — Other Ambulatory Visit: Payer: Self-pay

## 2019-07-19 ENCOUNTER — Inpatient Hospital Stay (HOSPITAL_COMMUNITY): Payer: Medicaid Other

## 2019-07-19 ENCOUNTER — Encounter (HOSPITAL_COMMUNITY): Payer: Self-pay | Admitting: Emergency Medicine

## 2019-07-19 ENCOUNTER — Encounter (HOSPITAL_COMMUNITY)
Admission: EM | Disposition: A | Discharge status: Rehabilitation Facility | Payer: Self-pay | Source: Home / Self Care | Attending: Neurology

## 2019-07-19 ENCOUNTER — Telehealth: Payer: Self-pay

## 2019-07-19 ENCOUNTER — Emergency Department (HOSPITAL_COMMUNITY): Payer: Medicaid Other

## 2019-07-19 ENCOUNTER — Inpatient Hospital Stay (HOSPITAL_COMMUNITY)
Admission: EM | Admit: 2019-07-19 | Discharge: 2019-09-06 | DRG: 023 | Disposition: A | Discharge status: Rehabilitation Facility | Payer: Medicaid Other | Attending: Internal Medicine | Admitting: Internal Medicine

## 2019-07-19 DIAGNOSIS — F329 Major depressive disorder, single episode, unspecified: Secondary | ICD-10-CM | POA: Diagnosis not present

## 2019-07-19 DIAGNOSIS — R1900 Intra-abdominal and pelvic swelling, mass and lump, unspecified site: Secondary | ICD-10-CM

## 2019-07-19 DIAGNOSIS — T8141XA Infection following a procedure, superficial incisional surgical site, initial encounter: Secondary | ICD-10-CM | POA: Diagnosis not present

## 2019-07-19 DIAGNOSIS — L02811 Cutaneous abscess of head [any part, except face]: Secondary | ICD-10-CM | POA: Diagnosis not present

## 2019-07-19 DIAGNOSIS — I10 Essential (primary) hypertension: Secondary | ICD-10-CM

## 2019-07-19 DIAGNOSIS — Z20828 Contact with and (suspected) exposure to other viral communicable diseases: Secondary | ICD-10-CM | POA: Diagnosis not present

## 2019-07-19 DIAGNOSIS — Z86718 Personal history of other venous thrombosis and embolism: Secondary | ICD-10-CM | POA: Diagnosis not present

## 2019-07-19 DIAGNOSIS — I82409 Acute embolism and thrombosis of unspecified deep veins of unspecified lower extremity: Secondary | ICD-10-CM | POA: Diagnosis not present

## 2019-07-19 DIAGNOSIS — I7771 Dissection of carotid artery: Secondary | ICD-10-CM | POA: Diagnosis not present

## 2019-07-19 DIAGNOSIS — I63529 Cerebral infarction due to unspecified occlusion or stenosis of unspecified anterior cerebral artery: Secondary | ICD-10-CM | POA: Diagnosis present

## 2019-07-19 DIAGNOSIS — Z95828 Presence of other vascular implants and grafts: Secondary | ICD-10-CM | POA: Diagnosis not present

## 2019-07-19 DIAGNOSIS — Z7189 Other specified counseling: Secondary | ICD-10-CM | POA: Diagnosis not present

## 2019-07-19 DIAGNOSIS — I63311 Cerebral infarction due to thrombosis of right middle cerebral artery: Secondary | ICD-10-CM | POA: Diagnosis not present

## 2019-07-19 DIAGNOSIS — G479 Sleep disorder, unspecified: Secondary | ICD-10-CM | POA: Diagnosis not present

## 2019-07-19 DIAGNOSIS — I6789 Other cerebrovascular disease: Secondary | ICD-10-CM | POA: Diagnosis not present

## 2019-07-19 DIAGNOSIS — R4182 Altered mental status, unspecified: Secondary | ICD-10-CM | POA: Diagnosis not present

## 2019-07-19 DIAGNOSIS — Y838 Other surgical procedures as the cause of abnormal reaction of the patient, or of later complication, without mention of misadventure at the time of the procedure: Secondary | ICD-10-CM | POA: Diagnosis not present

## 2019-07-19 DIAGNOSIS — I2609 Other pulmonary embolism with acute cor pulmonale: Secondary | ICD-10-CM | POA: Diagnosis not present

## 2019-07-19 DIAGNOSIS — G935 Compression of brain: Secondary | ICD-10-CM | POA: Diagnosis not present

## 2019-07-19 DIAGNOSIS — R414 Neurologic neglect syndrome: Secondary | ICD-10-CM | POA: Diagnosis present

## 2019-07-19 DIAGNOSIS — Z931 Gastrostomy status: Secondary | ICD-10-CM | POA: Diagnosis not present

## 2019-07-19 DIAGNOSIS — R4020 Unspecified coma: Secondary | ICD-10-CM | POA: Diagnosis not present

## 2019-07-19 DIAGNOSIS — I639 Cerebral infarction, unspecified: Secondary | ICD-10-CM | POA: Diagnosis not present

## 2019-07-19 DIAGNOSIS — Z930 Tracheostomy status: Secondary | ICD-10-CM | POA: Diagnosis not present

## 2019-07-19 DIAGNOSIS — Z452 Encounter for adjustment and management of vascular access device: Secondary | ICD-10-CM | POA: Diagnosis not present

## 2019-07-19 DIAGNOSIS — I63231 Cerebral infarction due to unspecified occlusion or stenosis of right carotid arteries: Secondary | ICD-10-CM | POA: Diagnosis not present

## 2019-07-19 DIAGNOSIS — I629 Nontraumatic intracranial hemorrhage, unspecified: Secondary | ICD-10-CM | POA: Diagnosis not present

## 2019-07-19 DIAGNOSIS — D62 Acute posthemorrhagic anemia: Secondary | ICD-10-CM | POA: Diagnosis not present

## 2019-07-19 DIAGNOSIS — R569 Unspecified convulsions: Secondary | ICD-10-CM | POA: Diagnosis not present

## 2019-07-19 DIAGNOSIS — Z0189 Encounter for other specified special examinations: Secondary | ICD-10-CM

## 2019-07-19 DIAGNOSIS — M6282 Rhabdomyolysis: Secondary | ICD-10-CM | POA: Diagnosis present

## 2019-07-19 DIAGNOSIS — G9349 Other encephalopathy: Secondary | ICD-10-CM | POA: Diagnosis not present

## 2019-07-19 DIAGNOSIS — G060 Intracranial abscess and granuloma: Secondary | ICD-10-CM | POA: Diagnosis not present

## 2019-07-19 DIAGNOSIS — I824Y2 Acute embolism and thrombosis of unspecified deep veins of left proximal lower extremity: Secondary | ICD-10-CM | POA: Diagnosis not present

## 2019-07-19 DIAGNOSIS — I63411 Cerebral infarction due to embolism of right middle cerebral artery: Secondary | ICD-10-CM | POA: Diagnosis not present

## 2019-07-19 DIAGNOSIS — R1312 Dysphagia, oropharyngeal phase: Secondary | ICD-10-CM | POA: Diagnosis not present

## 2019-07-19 DIAGNOSIS — Z86711 Personal history of pulmonary embolism: Secondary | ICD-10-CM | POA: Diagnosis not present

## 2019-07-19 DIAGNOSIS — E876 Hypokalemia: Secondary | ICD-10-CM | POA: Diagnosis not present

## 2019-07-19 DIAGNOSIS — X58XXXA Exposure to other specified factors, initial encounter: Secondary | ICD-10-CM | POA: Diagnosis not present

## 2019-07-19 DIAGNOSIS — Z4659 Encounter for fitting and adjustment of other gastrointestinal appliance and device: Secondary | ICD-10-CM

## 2019-07-19 DIAGNOSIS — B965 Pseudomonas (aeruginosa) (mallei) (pseudomallei) as the cause of diseases classified elsewhere: Secondary | ICD-10-CM | POA: Diagnosis not present

## 2019-07-19 DIAGNOSIS — Z9889 Other specified postprocedural states: Secondary | ICD-10-CM | POA: Diagnosis not present

## 2019-07-19 DIAGNOSIS — L0591 Pilonidal cyst without abscess: Secondary | ICD-10-CM | POA: Diagnosis not present

## 2019-07-19 DIAGNOSIS — R001 Bradycardia, unspecified: Secondary | ICD-10-CM | POA: Diagnosis not present

## 2019-07-19 DIAGNOSIS — D6859 Other primary thrombophilia: Secondary | ICD-10-CM | POA: Diagnosis not present

## 2019-07-19 DIAGNOSIS — I8290 Acute embolism and thrombosis of unspecified vein: Secondary | ICD-10-CM

## 2019-07-19 DIAGNOSIS — Z515 Encounter for palliative care: Secondary | ICD-10-CM | POA: Diagnosis not present

## 2019-07-19 DIAGNOSIS — J9811 Atelectasis: Secondary | ICD-10-CM | POA: Diagnosis not present

## 2019-07-19 DIAGNOSIS — S0081XA Abrasion of other part of head, initial encounter: Secondary | ICD-10-CM | POA: Diagnosis present

## 2019-07-19 DIAGNOSIS — G936 Cerebral edema: Secondary | ICD-10-CM | POA: Diagnosis present

## 2019-07-19 DIAGNOSIS — R4701 Aphasia: Secondary | ICD-10-CM | POA: Diagnosis present

## 2019-07-19 DIAGNOSIS — I2699 Other pulmonary embolism without acute cor pulmonale: Secondary | ICD-10-CM | POA: Diagnosis not present

## 2019-07-19 DIAGNOSIS — R188 Other ascites: Secondary | ICD-10-CM | POA: Diagnosis not present

## 2019-07-19 DIAGNOSIS — I82451 Acute embolism and thrombosis of right peroneal vein: Secondary | ICD-10-CM | POA: Diagnosis not present

## 2019-07-19 DIAGNOSIS — I63511 Cerebral infarction due to unspecified occlusion or stenosis of right middle cerebral artery: Principal | ICD-10-CM | POA: Diagnosis present

## 2019-07-19 DIAGNOSIS — M7989 Other specified soft tissue disorders: Secondary | ICD-10-CM | POA: Diagnosis not present

## 2019-07-19 DIAGNOSIS — I69351 Hemiplegia and hemiparesis following cerebral infarction affecting right dominant side: Secondary | ICD-10-CM | POA: Diagnosis not present

## 2019-07-19 DIAGNOSIS — I82412 Acute embolism and thrombosis of left femoral vein: Secondary | ICD-10-CM | POA: Diagnosis not present

## 2019-07-19 DIAGNOSIS — Z87891 Personal history of nicotine dependence: Secondary | ICD-10-CM

## 2019-07-19 DIAGNOSIS — Z0389 Encounter for observation for other suspected diseases and conditions ruled out: Secondary | ICD-10-CM | POA: Diagnosis not present

## 2019-07-19 DIAGNOSIS — R509 Fever, unspecified: Secondary | ICD-10-CM | POA: Diagnosis not present

## 2019-07-19 DIAGNOSIS — F1210 Cannabis abuse, uncomplicated: Secondary | ICD-10-CM | POA: Diagnosis not present

## 2019-07-19 DIAGNOSIS — R6510 Systemic inflammatory response syndrome (SIRS) of non-infectious origin without acute organ dysfunction: Secondary | ICD-10-CM | POA: Diagnosis not present

## 2019-07-19 DIAGNOSIS — T8149XA Infection following a procedure, other surgical site, initial encounter: Secondary | ICD-10-CM | POA: Diagnosis not present

## 2019-07-19 DIAGNOSIS — Z03818 Encounter for observation for suspected exposure to other biological agents ruled out: Secondary | ICD-10-CM | POA: Diagnosis not present

## 2019-07-19 DIAGNOSIS — H5340 Unspecified visual field defects: Secondary | ICD-10-CM | POA: Diagnosis present

## 2019-07-19 DIAGNOSIS — I8291 Chronic embolism and thrombosis of unspecified vein: Secondary | ICD-10-CM | POA: Diagnosis not present

## 2019-07-19 DIAGNOSIS — I2693 Single subsegmental pulmonary embolism without acute cor pulmonale: Secondary | ICD-10-CM | POA: Diagnosis not present

## 2019-07-19 DIAGNOSIS — I63233 Cerebral infarction due to unspecified occlusion or stenosis of bilateral carotid arteries: Secondary | ICD-10-CM | POA: Diagnosis not present

## 2019-07-19 DIAGNOSIS — G8194 Hemiplegia, unspecified affecting left nondominant side: Secondary | ICD-10-CM | POA: Diagnosis not present

## 2019-07-19 DIAGNOSIS — I959 Hypotension, unspecified: Secondary | ICD-10-CM | POA: Diagnosis not present

## 2019-07-19 DIAGNOSIS — J9601 Acute respiratory failure with hypoxia: Secondary | ICD-10-CM | POA: Diagnosis not present

## 2019-07-19 DIAGNOSIS — G8190 Hemiplegia, unspecified affecting unspecified side: Secondary | ICD-10-CM | POA: Diagnosis not present

## 2019-07-19 DIAGNOSIS — I824Z3 Acute embolism and thrombosis of unspecified deep veins of distal lower extremity, bilateral: Secondary | ICD-10-CM | POA: Diagnosis not present

## 2019-07-19 DIAGNOSIS — I82422 Acute embolism and thrombosis of left iliac vein: Secondary | ICD-10-CM | POA: Diagnosis not present

## 2019-07-19 DIAGNOSIS — R1310 Dysphagia, unspecified: Secondary | ICD-10-CM

## 2019-07-19 DIAGNOSIS — Z4682 Encounter for fitting and adjustment of non-vascular catheter: Secondary | ICD-10-CM | POA: Diagnosis not present

## 2019-07-19 DIAGNOSIS — I69391 Dysphagia following cerebral infarction: Secondary | ICD-10-CM | POA: Diagnosis not present

## 2019-07-19 DIAGNOSIS — D72829 Elevated white blood cell count, unspecified: Secondary | ICD-10-CM

## 2019-07-19 DIAGNOSIS — I82433 Acute embolism and thrombosis of popliteal vein, bilateral: Secondary | ICD-10-CM | POA: Diagnosis not present

## 2019-07-19 DIAGNOSIS — D72825 Bandemia: Secondary | ICD-10-CM | POA: Diagnosis not present

## 2019-07-19 DIAGNOSIS — I6389 Other cerebral infarction: Secondary | ICD-10-CM | POA: Diagnosis not present

## 2019-07-19 DIAGNOSIS — I6521 Occlusion and stenosis of right carotid artery: Secondary | ICD-10-CM | POA: Diagnosis not present

## 2019-07-19 DIAGNOSIS — J9600 Acute respiratory failure, unspecified whether with hypoxia or hypercapnia: Secondary | ICD-10-CM | POA: Diagnosis not present

## 2019-07-19 DIAGNOSIS — I82442 Acute embolism and thrombosis of left tibial vein: Secondary | ICD-10-CM | POA: Diagnosis not present

## 2019-07-19 DIAGNOSIS — Z9911 Dependence on respirator [ventilator] status: Secondary | ICD-10-CM | POA: Diagnosis not present

## 2019-07-19 DIAGNOSIS — L7632 Postprocedural hematoma of skin and subcutaneous tissue following other procedure: Secondary | ICD-10-CM | POA: Diagnosis not present

## 2019-07-19 DIAGNOSIS — G9340 Encephalopathy, unspecified: Secondary | ICD-10-CM | POA: Insufficient documentation

## 2019-07-19 DIAGNOSIS — A419 Sepsis, unspecified organism: Secondary | ICD-10-CM | POA: Diagnosis not present

## 2019-07-19 DIAGNOSIS — N390 Urinary tract infection, site not specified: Secondary | ICD-10-CM | POA: Diagnosis not present

## 2019-07-19 DIAGNOSIS — Z978 Presence of other specified devices: Secondary | ICD-10-CM | POA: Diagnosis not present

## 2019-07-19 DIAGNOSIS — L8990 Pressure ulcer of unspecified site, unspecified stage: Secondary | ICD-10-CM | POA: Insufficient documentation

## 2019-07-19 DIAGNOSIS — I6300 Cerebral infarction due to thrombosis of unspecified precerebral artery: Secondary | ICD-10-CM | POA: Diagnosis not present

## 2019-07-19 DIAGNOSIS — I82432 Acute embolism and thrombosis of left popliteal vein: Secondary | ICD-10-CM | POA: Diagnosis not present

## 2019-07-19 DIAGNOSIS — I615 Nontraumatic intracerebral hemorrhage, intraventricular: Secondary | ICD-10-CM | POA: Diagnosis not present

## 2019-07-19 DIAGNOSIS — I82403 Acute embolism and thrombosis of unspecified deep veins of lower extremity, bilateral: Secondary | ICD-10-CM | POA: Diagnosis not present

## 2019-07-19 DIAGNOSIS — I69354 Hemiplegia and hemiparesis following cerebral infarction affecting left non-dominant side: Secondary | ICD-10-CM | POA: Diagnosis not present

## 2019-07-19 DIAGNOSIS — R7401 Elevation of levels of liver transaminase levels: Secondary | ICD-10-CM | POA: Diagnosis not present

## 2019-07-19 DIAGNOSIS — Z781 Physical restraint status: Secondary | ICD-10-CM

## 2019-07-19 DIAGNOSIS — S301XXA Contusion of abdominal wall, initial encounter: Secondary | ICD-10-CM | POA: Diagnosis not present

## 2019-07-19 DIAGNOSIS — R531 Weakness: Secondary | ICD-10-CM | POA: Diagnosis not present

## 2019-07-19 DIAGNOSIS — I63513 Cerebral infarction due to unspecified occlusion or stenosis of bilateral middle cerebral arteries: Secondary | ICD-10-CM | POA: Diagnosis not present

## 2019-07-19 DIAGNOSIS — I824Z2 Acute embolism and thrombosis of unspecified deep veins of left distal lower extremity: Secondary | ICD-10-CM | POA: Diagnosis not present

## 2019-07-19 DIAGNOSIS — Z833 Family history of diabetes mellitus: Secondary | ICD-10-CM

## 2019-07-19 DIAGNOSIS — R29724 NIHSS score 24: Secondary | ICD-10-CM | POA: Diagnosis present

## 2019-07-19 DIAGNOSIS — Z8673 Personal history of transient ischemic attack (TIA), and cerebral infarction without residual deficits: Secondary | ICD-10-CM | POA: Diagnosis not present

## 2019-07-19 DIAGNOSIS — Z960 Presence of urogenital implants: Secondary | ICD-10-CM | POA: Diagnosis not present

## 2019-07-19 DIAGNOSIS — E785 Hyperlipidemia, unspecified: Secondary | ICD-10-CM | POA: Diagnosis present

## 2019-07-19 DIAGNOSIS — S31103D Unspecified open wound of abdominal wall, right lower quadrant without penetration into peritoneal cavity, subsequent encounter: Secondary | ICD-10-CM | POA: Diagnosis not present

## 2019-07-19 DIAGNOSIS — R55 Syncope and collapse: Secondary | ICD-10-CM | POA: Diagnosis not present

## 2019-07-19 DIAGNOSIS — R748 Abnormal levels of other serum enzymes: Secondary | ICD-10-CM | POA: Diagnosis not present

## 2019-07-19 DIAGNOSIS — I618 Other nontraumatic intracerebral hemorrhage: Secondary | ICD-10-CM | POA: Diagnosis not present

## 2019-07-19 DIAGNOSIS — R109 Unspecified abdominal pain: Secondary | ICD-10-CM | POA: Diagnosis not present

## 2019-07-19 DIAGNOSIS — I619 Nontraumatic intracerebral hemorrhage, unspecified: Secondary | ICD-10-CM | POA: Diagnosis not present

## 2019-07-19 DIAGNOSIS — R404 Transient alteration of awareness: Secondary | ICD-10-CM | POA: Diagnosis not present

## 2019-07-19 DIAGNOSIS — G0490 Encephalitis and encephalomyelitis, unspecified: Secondary | ICD-10-CM | POA: Diagnosis not present

## 2019-07-19 DIAGNOSIS — J9690 Respiratory failure, unspecified, unspecified whether with hypoxia or hypercapnia: Secondary | ICD-10-CM

## 2019-07-19 DIAGNOSIS — R945 Abnormal results of liver function studies: Secondary | ICD-10-CM | POA: Diagnosis not present

## 2019-07-19 HISTORY — PX: CRANIOTOMY: SHX93

## 2019-07-19 LAB — COMPREHENSIVE METABOLIC PANEL
ALT: 50 U/L — ABNORMAL HIGH (ref 0–44)
AST: 125 U/L — ABNORMAL HIGH (ref 15–41)
Albumin: 4.6 g/dL (ref 3.5–5.0)
Alkaline Phosphatase: 82 U/L (ref 38–126)
Anion gap: 14 (ref 5–15)
BUN: 18 mg/dL (ref 6–20)
CO2: 22 mmol/L (ref 22–32)
Calcium: 9.7 mg/dL (ref 8.9–10.3)
Chloride: 107 mmol/L (ref 98–111)
Creatinine, Ser: 1.43 mg/dL — ABNORMAL HIGH (ref 0.61–1.24)
GFR calc Af Amer: >60 mL/min (ref >60)
GFR calc non Af Amer: >60 mL/min (ref >60)
Glucose, Bld: 116 mg/dL — ABNORMAL HIGH (ref 70–99)
Potassium: 3.8 mmol/L (ref 3.5–5.1)
Sodium: 143 mmol/L (ref 135–145)
Total Bilirubin: 1.3 mg/dL — ABNORMAL HIGH (ref 0.3–1.2)
Total Protein: 7.9 g/dL (ref 6.5–8.1)

## 2019-07-19 LAB — CBC WITH DIFFERENTIAL/PLATELET
Abs Immature Granulocytes: 0.07 10*3/uL (ref 0.00–0.07)
Basophils Absolute: 0 10*3/uL (ref 0.0–0.1)
Basophils Relative: 0 %
Eosinophils Absolute: 0 10*3/uL (ref 0.0–0.5)
Eosinophils Relative: 0 %
HCT: 50.2 % (ref 39.0–52.0)
Hemoglobin: 16.8 g/dL (ref 13.0–17.0)
Immature Granulocytes: 0 %
Lymphocytes Relative: 8 %
Lymphs Abs: 1.3 10*3/uL (ref 0.7–4.0)
MCH: 28.6 pg (ref 26.0–34.0)
MCHC: 33.5 g/dL (ref 30.0–36.0)
MCV: 85.4 fL (ref 80.0–100.0)
Monocytes Absolute: 1.2 10*3/uL — ABNORMAL HIGH (ref 0.1–1.0)
Monocytes Relative: 7 %
Neutro Abs: 14.2 10*3/uL — ABNORMAL HIGH (ref 1.7–7.7)
Neutrophils Relative %: 85 %
Platelets: 275 10*3/uL (ref 150–400)
RBC: 5.88 MIL/uL — ABNORMAL HIGH (ref 4.22–5.81)
RDW: 14.6 % (ref 11.5–15.5)
WBC: 16.7 10*3/uL — ABNORMAL HIGH (ref 4.0–10.5)
nRBC: 0 % (ref 0.0–0.2)

## 2019-07-19 LAB — URINALYSIS, ROUTINE W REFLEX MICROSCOPIC
Bacteria, UA: NONE SEEN
Bilirubin Urine: NEGATIVE
Glucose, UA: NEGATIVE mg/dL
Ketones, ur: NEGATIVE mg/dL
Leukocytes,Ua: NEGATIVE
Nitrite: NEGATIVE
Protein, ur: 30 mg/dL — AB
Specific Gravity, Urine: 1.03 (ref 1.005–1.030)
pH: 5 (ref 5.0–8.0)

## 2019-07-19 LAB — RAPID URINE DRUG SCREEN, HOSP PERFORMED
Amphetamines: NOT DETECTED
Barbiturates: NOT DETECTED
Benzodiazepines: NOT DETECTED
Cocaine: NOT DETECTED
Opiates: NOT DETECTED
Tetrahydrocannabinol: POSITIVE — AB

## 2019-07-19 LAB — CBG MONITORING, ED: Glucose-Capillary: 123 mg/dL — ABNORMAL HIGH (ref 70–99)

## 2019-07-19 LAB — PROCALCITONIN: Procalcitonin: <0.1 ng/mL

## 2019-07-19 LAB — CK: Total CK: 7533 U/L — ABNORMAL HIGH (ref 49–397)

## 2019-07-19 LAB — ETHANOL: Alcohol, Ethyl (B): <10 mg/dL (ref <10)

## 2019-07-19 LAB — SARS CORONAVIRUS 2 (TAT 6-24 HRS): SARS Coronavirus 2: NEGATIVE

## 2019-07-19 LAB — C-REACTIVE PROTEIN: CRP: 1.3 mg/dL — ABNORMAL HIGH (ref <1.0)

## 2019-07-19 LAB — FERRITIN: Ferritin: 198 ng/mL (ref 24–336)

## 2019-07-19 LAB — SODIUM: Sodium: 143 mmol/L (ref 135–145)

## 2019-07-19 LAB — LACTATE DEHYDROGENASE: LDH: 362 U/L — ABNORMAL HIGH (ref 98–192)

## 2019-07-19 LAB — MRSA PCR SCREENING: MRSA by PCR: NEGATIVE

## 2019-07-19 LAB — SEDIMENTATION RATE: Sed Rate: 5 mm/hr (ref 0–16)

## 2019-07-19 SURGERY — CRANIOTOMY HEMATOMA EVACUATION SUBDURAL
Anesthesia: General | Site: Head | Laterality: Right

## 2019-07-19 MED ORDER — PROPOFOL 10 MG/ML IV BOLUS
INTRAVENOUS | Status: AC
  Filled 2019-07-19: qty 20

## 2019-07-19 MED ORDER — BACITRACIN ZINC 500 UNIT/GM EX OINT
TOPICAL_OINTMENT | CUTANEOUS | Status: AC
  Filled 2019-07-19: qty 28.35

## 2019-07-19 MED ORDER — CEFAZOLIN SODIUM-DEXTROSE 2-3 GM-%(50ML) IV SOLR
INTRAVENOUS | Status: DC | PRN
  Administered 2019-07-19: 2 g via INTRAVENOUS

## 2019-07-19 MED ORDER — PROPOFOL 500 MG/50ML IV EMUL
INTRAVENOUS | Status: DC | PRN
  Administered 2019-07-19: 50 ug/kg/min via INTRAVENOUS

## 2019-07-19 MED ORDER — THROMBIN 5000 UNITS EX SOLR
OROMUCOSAL | Status: DC | PRN
  Administered 2019-07-19: 22:00:00 5 mL via TOPICAL

## 2019-07-19 MED ORDER — SODIUM CHLORIDE (PF) 0.9 % IJ SOLN
INTRAMUSCULAR | Status: AC
  Filled 2019-07-19: qty 20

## 2019-07-19 MED ORDER — GLYCOPYRROLATE 0.2 MG/ML IJ SOLN
INTRAMUSCULAR | Status: DC | PRN
  Administered 2019-07-19: 0.2 mg via INTRAVENOUS

## 2019-07-19 MED ORDER — FENTANYL CITRATE (PF) 100 MCG/2ML IJ SOLN
50.0000 ug | Freq: Once | INTRAMUSCULAR | Status: AC
  Administered 2019-07-19: 14:00:00 50 ug via INTRAVENOUS
  Filled 2019-07-19: qty 2

## 2019-07-19 MED ORDER — SODIUM CHLORIDE 0.9 % IV SOLN
INTRAVENOUS | Status: DC | PRN
  Administered 2019-07-19: 23:00:00 500 mL

## 2019-07-19 MED ORDER — SENNOSIDES-DOCUSATE SODIUM 8.6-50 MG PO TABS: 1.0000 | ORAL_TABLET | Freq: Every evening | ORAL | Status: DC | PRN

## 2019-07-19 MED ORDER — SODIUM CHLORIDE 0.9 % IV SOLN
INTRAVENOUS | Status: DC | PRN
  Administered 2019-07-19 (×2): via INTRAVENOUS

## 2019-07-19 MED ORDER — SUFENTANIL CITRATE 50 MCG/ML IV SOLN
INTRAVENOUS | Status: AC
  Filled 2019-07-19: qty 1

## 2019-07-19 MED ORDER — SODIUM CHLORIDE 0.9 % IV SOLN: INTRAVENOUS | Status: DC

## 2019-07-19 MED ORDER — THROMBIN 20000 UNITS EX SOLR
CUTANEOUS | Status: AC
  Filled 2019-07-19: qty 20000

## 2019-07-19 MED ORDER — SODIUM CHLORIDE 3 % IV SOLN
INTRAVENOUS | Status: AC
  Administered 2019-07-19 – 2019-07-20 (×3): 50 mL/h via INTRAVENOUS
  Filled 2019-07-19 (×3): qty 500

## 2019-07-19 MED ORDER — SODIUM CHLORIDE 0.9 % IV SOLN
INTRAVENOUS | Status: DC
Start: 2019-07-19 — End: 2019-07-20

## 2019-07-19 MED ORDER — LIDOCAINE 2% (20 MG/ML) 5 ML SYRINGE
INTRAMUSCULAR | Status: AC
  Filled 2019-07-19: qty 5

## 2019-07-19 MED ORDER — GLYCOPYRROLATE PF 0.2 MG/ML IJ SOSY
PREFILLED_SYRINGE | INTRAMUSCULAR | Status: AC
  Filled 2019-07-19: qty 1

## 2019-07-19 MED ORDER — ACETAMINOPHEN 160 MG/5ML PO SOLN
650.0000 mg | ORAL | Status: DC | PRN
  Administered 2019-07-20 – 2019-08-14 (×43): 650 mg
  Filled 2019-07-19 (×44): qty 20.3

## 2019-07-19 MED ORDER — ACETAMINOPHEN 325 MG PO TABS
650.0000 mg | ORAL_TABLET | ORAL | Status: DC | PRN
  Administered 2019-08-08: 650 mg via ORAL
  Filled 2019-07-19 (×3): qty 2

## 2019-07-19 MED ORDER — ALBUMIN HUMAN 5 % IV SOLN
INTRAVENOUS | Status: DC | PRN
  Administered 2019-07-19: 23:00:00 via INTRAVENOUS

## 2019-07-19 MED ORDER — PROPOFOL 10 MG/ML IV BOLUS
INTRAVENOUS | Status: DC | PRN
  Administered 2019-07-19: 100 mg via INTRAVENOUS

## 2019-07-19 MED ORDER — SUCCINYLCHOLINE CHLORIDE 200 MG/10ML IV SOSY
PREFILLED_SYRINGE | INTRAVENOUS | Status: AC
  Filled 2019-07-19: qty 10

## 2019-07-19 MED ORDER — STROKE: EARLY STAGES OF RECOVERY BOOK
Freq: Once | Status: AC
  Administered 2019-07-19: 16:00:00

## 2019-07-19 MED ORDER — BUPIVACAINE HCL (PF) 0.5 % IJ SOLN
INTRAMUSCULAR | Status: DC | PRN
  Administered 2019-07-19: 9.5 mL

## 2019-07-19 MED ORDER — ROCURONIUM BROMIDE 100 MG/10ML IV SOLN
INTRAVENOUS | Status: DC | PRN
  Administered 2019-07-19 (×2): 50 mg via INTRAVENOUS

## 2019-07-19 MED ORDER — CHLORHEXIDINE GLUCONATE 0.12% ORAL RINSE (MEDLINE KIT)
15.0000 mL | Freq: Two times a day (BID) | OROMUCOSAL | Status: DC
  Administered 2019-07-20 – 2019-07-24 (×10): 15 mL via OROMUCOSAL

## 2019-07-19 MED ORDER — BACITRACIN ZINC 500 UNIT/GM EX OINT
TOPICAL_OINTMENT | CUTANEOUS | Status: DC | PRN
  Administered 2019-07-19 (×2): 1 via TOPICAL

## 2019-07-19 MED ORDER — LORAZEPAM 2 MG/ML IJ SOLN
1.0000 mg | Freq: Once | INTRAMUSCULAR | Status: AC
  Administered 2019-07-19: 1 mg via INTRAVENOUS
  Filled 2019-07-19: qty 1

## 2019-07-19 MED ORDER — SUFENTANIL CITRATE 50 MCG/ML IV SOLN
INTRAVENOUS | Status: DC | PRN
  Administered 2019-07-19 (×2): 10 ug via INTRAVENOUS
  Administered 2019-07-19: 30 ug via INTRAVENOUS
  Administered 2019-07-19: 10 ug via INTRAVENOUS
  Administered 2019-07-19: 20 ug via INTRAVENOUS
  Administered 2019-07-19 (×2): 10 ug via INTRAVENOUS

## 2019-07-19 MED ORDER — ROCURONIUM BROMIDE 10 MG/ML (PF) SYRINGE
PREFILLED_SYRINGE | INTRAVENOUS | Status: AC
  Filled 2019-07-19: qty 10

## 2019-07-19 MED ORDER — CEFAZOLIN SODIUM 1 G IJ SOLR
INTRAMUSCULAR | Status: AC
  Filled 2019-07-19: qty 20

## 2019-07-19 MED ORDER — BUPIVACAINE HCL (PF) 0.5 % IJ SOLN
INTRAMUSCULAR | Status: AC
  Filled 2019-07-19: qty 30

## 2019-07-19 MED ORDER — PHENYLEPHRINE HCL (PRESSORS) 10 MG/ML IV SOLN
INTRAVENOUS | Status: DC | PRN
  Administered 2019-07-19: 80 ug via INTRAVENOUS

## 2019-07-19 MED ORDER — ACETAMINOPHEN 650 MG RE SUPP
650.0000 mg | RECTAL | Status: DC | PRN
  Administered 2019-07-19 – 2019-07-28 (×5): 650 mg via RECTAL
  Filled 2019-07-19 (×5): qty 1

## 2019-07-19 MED ORDER — 0.9 % SODIUM CHLORIDE (POUR BTL) OPTIME
TOPICAL | Status: DC | PRN
  Administered 2019-07-19 (×2): 1000 mL

## 2019-07-19 MED ORDER — ASPIRIN 300 MG RE SUPP
300.0000 mg | Freq: Once | RECTAL | Status: AC
  Administered 2019-07-19: 300 mg via RECTAL
  Filled 2019-07-19: qty 1

## 2019-07-19 MED ORDER — SUCCINYLCHOLINE CHLORIDE 20 MG/ML IJ SOLN
INTRAMUSCULAR | Status: DC | PRN
  Administered 2019-07-19: 160 mg via INTRAVENOUS

## 2019-07-19 MED ORDER — LIDOCAINE HCL (CARDIAC) PF 100 MG/5ML IV SOSY
PREFILLED_SYRINGE | INTRAVENOUS | Status: DC | PRN
  Administered 2019-07-19: 100 mg via INTRATRACHEAL

## 2019-07-19 MED ORDER — ORAL CARE MOUTH RINSE
15.0000 mL | OROMUCOSAL | Status: DC
  Administered 2019-07-20 (×4): 15 mL via OROMUCOSAL

## 2019-07-19 MED ORDER — LIDOCAINE-EPINEPHRINE 1 %-1:100000 IJ SOLN
INTRAMUSCULAR | Status: DC | PRN
  Administered 2019-07-19: 9.5 mL via INTRADERMAL

## 2019-07-19 MED ORDER — LIDOCAINE-EPINEPHRINE 1 %-1:100000 IJ SOLN
INTRAMUSCULAR | Status: AC
  Filled 2019-07-19: qty 1

## 2019-07-19 MED ORDER — CHLORHEXIDINE GLUCONATE CLOTH 2 % EX PADS
6.0000 | MEDICATED_PAD | Freq: Every day | CUTANEOUS | Status: DC
  Administered 2019-07-19: 6 via TOPICAL

## 2019-07-19 MED ORDER — THROMBIN 20000 UNITS EX SOLR
CUTANEOUS | Status: DC | PRN
  Administered 2019-07-19: 20 mL via TOPICAL

## 2019-07-19 MED ORDER — THROMBIN 5000 UNITS EX SOLR
CUTANEOUS | Status: AC
  Filled 2019-07-19: qty 5000

## 2019-07-19 SURGICAL SUPPLY — 78 items
APL SKNCLS STERI-STRIP NONHPOA (GAUZE/BANDAGES/DRESSINGS)
BENZOIN TINCTURE PRP APPL 2/3 (GAUZE/BANDAGES/DRESSINGS) IMPLANT
BLADE CLIPPER SURG (BLADE) ×3 IMPLANT
BNDG GAUZE ELAST 4 BULKY (GAUZE/BANDAGES/DRESSINGS) IMPLANT
BUR ACORN 6.0 PRECISION (BURR) ×2 IMPLANT
BUR ACORN 6.0MM PRECISION (BURR) ×1
BUR MATCHSTICK NEURO 3.0 LAGG (BURR) IMPLANT
BUR SPIRAL ROUTER 2.3 (BUR) ×1 IMPLANT
BUR SPIRAL ROUTER 2.3MM (BUR) ×1
CANISTER SUCT 3000ML PPV (MISCELLANEOUS) ×5 IMPLANT
CARTRIDGE OIL MAESTRO DRILL (MISCELLANEOUS) ×1 IMPLANT
CLIP RANEY DISP (INSTRUMENTS) ×2 IMPLANT
CLIP VESOCCLUDE MED 6/CT (CLIP) IMPLANT
COVER WAND RF STERILE (DRAPES) ×3 IMPLANT
DIFFUSER DRILL AIR PNEUMATIC (MISCELLANEOUS) ×3 IMPLANT
DRAPE NEUROLOGICAL W/INCISE (DRAPES) ×3 IMPLANT
DRAPE SURG 17X23 STRL (DRAPES) IMPLANT
DRAPE WARM FLUID 44X44 (DRAPES) ×3 IMPLANT
DRSG OPSITE POSTOP 4X10 (GAUZE/BANDAGES/DRESSINGS) ×2 IMPLANT
DRSG TELFA 3X8 NADH (GAUZE/BANDAGES/DRESSINGS) ×3 IMPLANT
DURAGUARD 04CMX04CM ×2 IMPLANT
DURAGUARD 06CMX08CM ×2 IMPLANT
DURAPREP 6ML APPLICATOR 50/CS (WOUND CARE) ×3 IMPLANT
ELECT REM PT RETURN 9FT ADLT (ELECTROSURGICAL) ×3
ELECTRODE REM PT RTRN 9FT ADLT (ELECTROSURGICAL) ×1 IMPLANT
EVACUATOR 1/8 PVC DRAIN (DRAIN) IMPLANT
EVACUATOR SILICONE 100CC (DRAIN) IMPLANT
GAUZE 4X4 16PLY RFD (DISPOSABLE) IMPLANT
GAUZE SPONGE 4X4 12PLY STRL (GAUZE/BANDAGES/DRESSINGS) ×3 IMPLANT
GLOVE BIO SURGEON STRL SZ 6.5 (GLOVE) ×2 IMPLANT
GLOVE BIO SURGEON STRL SZ7 (GLOVE) ×4 IMPLANT
GLOVE BIO SURGEON STRL SZ7.5 (GLOVE) ×4 IMPLANT
GLOVE BIO SURGEONS STRL SZ 6.5 (GLOVE) ×2
GLOVE BIOGEL PI IND STRL 7.5 (GLOVE) ×2 IMPLANT
GLOVE BIOGEL PI INDICATOR 7.5 (GLOVE) ×4
GLOVE ECLIPSE 7.0 STRL STRAW (GLOVE) ×6 IMPLANT
GLOVE EXAM NITRILE XL STR (GLOVE) IMPLANT
GOWN STRL REUS W/ TWL LRG LVL3 (GOWN DISPOSABLE) ×2 IMPLANT
GOWN STRL REUS W/ TWL XL LVL3 (GOWN DISPOSABLE) IMPLANT
GOWN STRL REUS W/TWL 2XL LVL3 (GOWN DISPOSABLE) IMPLANT
GOWN STRL REUS W/TWL LRG LVL3 (GOWN DISPOSABLE) ×9
GOWN STRL REUS W/TWL XL LVL3 (GOWN DISPOSABLE)
GRAFT DURAGEN MATRIX 5WX7L (Graft) ×2 IMPLANT
HEMOSTAT POWDER KIT SURGIFOAM (HEMOSTASIS) ×3 IMPLANT
HEMOSTAT SURGICEL 2X14 (HEMOSTASIS) IMPLANT
KIT BASIN OR (CUSTOM PROCEDURE TRAY) ×3 IMPLANT
KIT TURNOVER KIT B (KITS) ×3 IMPLANT
NEEDLE HYPO 22GX1.5 SAFETY (NEEDLE) ×3 IMPLANT
NS IRRIG 1000ML POUR BTL (IV SOLUTION) ×3 IMPLANT
OIL CARTRIDGE MAESTRO DRILL (MISCELLANEOUS) ×3
PACK CRANIOTOMY CUSTOM (CUSTOM PROCEDURE TRAY) ×3 IMPLANT
PAD DRESSING TELFA 3X8 NADH (GAUZE/BANDAGES/DRESSINGS) IMPLANT
PATTIES SURGICAL .5 X.5 (GAUZE/BANDAGES/DRESSINGS) IMPLANT
PATTIES SURGICAL .5 X3 (DISPOSABLE) IMPLANT
PATTIES SURGICAL 1X1 (DISPOSABLE) IMPLANT
PERFORATOR LRG  14-11MM (BIT) ×2
PERFORATOR LRG 14-11MM (BIT) IMPLANT
SPONGE NEURO XRAY DETECT 1X3 (DISPOSABLE) IMPLANT
SPONGE SURGIFOAM ABS GEL 100 (HEMOSTASIS) ×3 IMPLANT
STAPLER VISISTAT 35W (STAPLE) ×3 IMPLANT
STOCKINETTE 6  STRL (DRAPES) ×2
STOCKINETTE 6 STRL (DRAPES) ×1 IMPLANT
SUT ETHILON 3 0 FSL (SUTURE) IMPLANT
SUT ETHILON 3 0 PS 1 (SUTURE) IMPLANT
SUT NURALON 4 0 TR CR/8 (SUTURE) ×9 IMPLANT
SUT STEEL 0 (SUTURE)
SUT STEEL 0 18XMFL TIE 17 (SUTURE) IMPLANT
SUT VIC AB 0 CT1 18XCR BRD8 (SUTURE) ×2 IMPLANT
SUT VIC AB 0 CT1 8-18 (SUTURE) ×6
SUT VIC AB 3-0 SH 8-18 (SUTURE) ×6 IMPLANT
TAPE CLOTH 1X10 TAN NS (GAUZE/BANDAGES/DRESSINGS) ×3 IMPLANT
TOWEL GREEN STERILE (TOWEL DISPOSABLE) ×3 IMPLANT
TOWEL GREEN STERILE FF (TOWEL DISPOSABLE) ×3 IMPLANT
TRAY FOLEY MTR SLVR 16FR STAT (SET/KITS/TRAYS/PACK) ×3 IMPLANT
TUBE CONNECTING 12'X1/4 (SUCTIONS) ×1
TUBE CONNECTING 12X1/4 (SUCTIONS) ×2 IMPLANT
UNDERPAD 30X30 (UNDERPADS AND DIAPERS) ×3 IMPLANT
WATER STERILE IRR 1000ML POUR (IV SOLUTION) ×3 IMPLANT

## 2019-07-19 NOTE — ED Notes (Signed)
ED TO INPATIENT HANDOFF REPORT  ED Nurse Name and Phone #: Lovell Sheehan [phone]  S Name/Age/Gender Matthew Black 43 y.o. male Room/Bed: 027C/027C  Code Status   Code Status: Full Code  Home/SNF/Other Rehab  Is this baseline? No   Triage Complete: Triage complete  Chief Complaint LT SIDED WEAKNESS  Triage Note Pt found by co worker , at work,  per ems no one had seen pt yesterday, pt found on floor had been incontinent , pt not moving left side at all but will move rt side  Has 18 rt ac pt not talking , will open eyes to pain, able to squeeze rt hand    Allergies Allergies  Allergen Reactions  . Hydrocodone Nausea Only    Level of Care/Admitting Diagnosis ED Disposition    ED Disposition Condition [facility] Area: [place] [100100]  Level of Care: ICU [6]  Covid Evaluation: Asymptomatic Screening Protocol (No Symptoms)  Diagnosis: [facility]cerebrum) [id number]  Admitting Physician: Kerney Elbe [phone]  Attending Physician: Cheral Marker, ERIC [email or web address]  Estimated length of stay: 5 - 7 days  Certification:: I certify this patient will need inpatient services for at least 2 midnights  PT Class (Do Not Modify): Inpatient [101]  PT Acc Code (Do Not Modify): Private [1]       B Medical/Surgery History Past Medical History:  Diagnosis Date  . 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 CYST EXCISION N/A 10/08/2016   Procedure: EXCISION OF POSTERIOR NECK CYST;  Surgeon: Clovis Riley, MD;  Location: [facility];  Service: General;  Laterality: N/A;  . INCISION AND DRAINAGE ABSCESS N/A 09/22/2014   Procedure: INCISION AND DRAINAGE ABSCESS POSTERIOR NECK;  Surgeon: Pedro Earls, MD;  Location: [facility];  Service: General;  Laterality: N/A;  . INCISION AND DRAINAGE ABSCESS N/A 12/20/2015   Procedure: INCISION AND DRAINAGE POSTERIOR NECK MASS;  Surgeon: Armandina Gemma, MD;  Location: [facility];  Service:  General;  Laterality: N/A;  . INCISION AND DRAINAGE ABSCESS Left 07/10/2004   middle finger  . MASS EXCISION N/A 07/21/2017   Procedure: EXCISION OF BENIGN NECK LESION WITH LAYERED CLOSURE;  Surgeon: Irene Limbo, MD;  Location: [facility];  Service: Plastics;  Laterality: N/A;  . MASS EXCISION N/A 11/10/2017   Procedure: EXCISION BENIGN LESION OF THE NECK WITH LAYERED CLOSURE;  Surgeon: Irene Limbo, MD;  Location: [facility];  Service: Plastics;  Laterality: N/A;     A IV Location/Drains/Wounds Patient Lines/Drains/Airways Status   Active Line/Drains/Airways    Name:   Placement date:   Placement time:   Site:   Days:   Peripheral IV 07/19/19 Right Antecubital   07/19/19    1000    Antecubital   less than 1   Incision (Closed) 07/21/17 Neck Other (Comment)   07/21/17    0845     728   Incision (Closed) 11/10/17 Neck Other (Comment)   11/10/17    0953     616          Intake/Output Last 24 hours No intake or output data in the 24 hours ending 07/19/19 1808  Labs/Imaging Results for orders placed or performed during the hospital encounter of 07/19/19 (from the past 48 hour(s))  CBG monitoring, ED     Status: Abnormal   Collection Time: 07/19/19  9:51 AM  Result Value Ref Range   Glucose-Capillary 123 (H)  70 - 99 mg/dL   Comment 1 Notify RN    Comment 2 Document in Chart   CBC with Differential     Status: Abnormal   Collection Time: 07/19/19 10:40 AM  Result Value Ref Range   WBC 16.7 (H) 4.0 - 10.5 K/uL   RBC 5.88 (H) 4.22 - 5.81 MIL/uL   Hemoglobin 16.8 13.0 - 17.0 g/dL   HCT 50.2 39.0 - 52.0 %   MCV 85.4 80.0 - 100.0 fL   MCH 28.6 26.0 - 34.0 pg   MCHC 33.5 30.0 - 36.0 g/dL   RDW 14.6 11.5 - 15.5 %   Platelets 275 150 - 400 K/uL   nRBC 0.0 0.0 - 0.2 %   Neutrophils Relative % 85 %   Neutro Abs 14.2 (H) 1.7 - 7.7 K/uL   Lymphocytes Relative 8 %   Lymphs Abs 1.3 0.7 - 4.0 K/uL   Monocytes Relative 7 %   Monocytes Absolute 1.2  (H) 0.1 - 1.0 K/uL   Eosinophils Relative 0 %   Eosinophils Absolute 0.0 0.0 - 0.5 K/uL   Basophils Relative 0 %   Basophils Absolute 0.0 0.0 - 0.1 K/uL   Immature Granulocytes 0 %   Abs Immature Granulocytes 0.07 0.00 - 0.07 K/uL    Comment: Performed at [facility], [address], [place], [place] [address]  Ethanol     Status: None   Collection Time: 07/19/19 10:40 AM  Result Value Ref Range   Alcohol, Ethyl (B) <10 <10 mg/dL    Comment: (NOTE) Lowest detectable limit for serum alcohol is 10 mg/dL. For medical purposes only. Performed at [facility], [place] [address], [place], [place] [address]   Urinalysis, Routine w reflex microscopic     Status: Abnormal   Collection Time: 07/19/19  1:48 PM  Result Value Ref Range   Color, Urine YELLOW YELLOW   APPearance TURBID (A) CLEAR   Specific Gravity, Urine 1.030 1.005 - 1.030   pH 5.0 5.0 - 8.0   Glucose, UA NEGATIVE NEGATIVE mg/dL   Hgb urine dipstick MODERATE (A) NEGATIVE   Bilirubin Urine NEGATIVE NEGATIVE   Ketones, ur NEGATIVE NEGATIVE mg/dL   Protein, ur 30 (A) NEGATIVE mg/dL   Nitrite NEGATIVE NEGATIVE   Leukocytes,Ua NEGATIVE NEGATIVE   RBC / HPF 0-5 0 - 5 RBC/hpf   WBC, UA 0-5 0 - 5 WBC/hpf   Bacteria, UA NONE SEEN NONE SEEN   Mucus PRESENT    Amorphous Crystal PRESENT     Comment: Performed at [facility] [address] [place], [place] [address]  Rapid urine drug screen (hospital performed)     Status: Abnormal   Collection Time: 07/19/19  1:48 PM  Result Value Ref Range   Opiates NONE DETECTED NONE DETECTED   Cocaine NONE DETECTED NONE DETECTED   Benzodiazepines NONE DETECTED NONE DETECTED   Amphetamines NONE DETECTED NONE DETECTED   Tetrahydrocannabinol POSITIVE (A) NONE DETECTED   Barbiturates NONE DETECTED NONE DETECTED    Comment: (NOTE) DRUG SCREEN FOR MEDICAL PURPOSES ONLY.  IF CONFIRMATION IS NEEDED FOR ANY PURPOSE, NOTIFY LAB WITHIN 5 DAYS. LOWEST DETECTABLE LIMITS FOR URINE  DRUG SCREEN Drug Class                     Cutoff (ng/mL) Amphetamine and metabolites    1000 Barbiturate and metabolites    200 Benzodiazepine  [id number] Tricyclics and metabolites     300 Opiates and metabolites        300 Cocaine and metabolites        300 THC                            50 Performed at [facility], [place] [address] [place], [place] [address]   Comprehensive metabolic panel     Status: Abnormal   Collection Time: 07/19/19  2:54 PM  Result Value Ref Range   Sodium 143 135 - 145 mmol/L   Potassium 3.8 3.5 - 5.1 mmol/L   Chloride 107 98 - 111 mmol/L   CO2 22 22 - 32 mmol/L   Glucose, Bld 116 (H) 70 - 99 mg/dL   BUN 18 6 - 20 mg/dL   Creatinine, Ser 1.43 (H) 0.61 - 1.24 mg/dL   Calcium 9.7 8.9 - 10.3 mg/dL   Total Protein 7.9 6.5 - 8.1 g/dL   Albumin 4.6 3.5 - 5.0 g/dL   AST 125 (H) 15 - 41 U/L   ALT 50 (H) 0 - 44 U/L   Alkaline Phosphatase 82 38 - 126 U/L   Total Bilirubin 1.3 (H) 0.3 - 1.2 mg/dL   GFR calc non Af Amer >60 >60 mL/min   GFR calc Af Amer >60 >60 mL/min   Anion gap 14 5 - 15    Comment: Performed at [facility] [address] [place], [place] [address]  C-reactive protein     Status: Abnormal   Collection Time: 07/19/19  4:42 PM  Result Value Ref Range   CRP 1.3 (H) <1.0 mg/dL    Comment: Performed at [facility], [place] [address], [place], [place] [address]  Lactate dehydrogenase     Status: Abnormal   Collection Time: 07/19/19  4:42 PM  Result Value Ref Range   LDH 362 (H) 98 - 192 U/L    Comment: Performed at [facility], [place] [address], [place], [place] [address]  Ferritin     Status: None   Collection Time: 07/19/19  4:42 PM  Result Value Ref Range   Ferritin 198 24 - 336 ng/mL    Comment: Performed at [facility] [address], [place], [place] [address]   Dg Chest 2 View  Result Date: 07/19/2019 CLINICAL DATA:  Found on floor. EXAM: CHEST - 2 VIEW COMPARISON:  09/21/2014 FINDINGS:  Heart and mediastinal contours are within normal limits. No focal opacities or effusions. No acute bony abnormality. IMPRESSION: No active cardiopulmonary disease. Electronically Signed   By: Rolm Baptise M.D.   On: 07/19/2019 15:50   Ct Head Wo Contrast  Result Date: 07/19/2019 CLINICAL DATA:  Follow-up stroke hemiparesis and aphasia. EXAM: CT HEAD WITHOUT CONTRAST TECHNIQUE: Contiguous axial images were obtained from the base of the skull through the vertex without intravenous contrast. COMPARISON:  CT and MRI studies same day FINDINGS: Brain: Progressive low-density and swelling within the knee complete right MCA and ACA territory affecting the right basal ganglia, frontal lobe, anterior temporal lobe and parietal lobe. Sparing only of the portion of the supratentorial brain supplied by the right PCA. Brain swelling but no hemorrhagic transformation. Mass effect with right-to-left shift of 5-6 mm. Hyperdense carotid terminus and right ACA MCA consistent with embolic occlusion. No extra-axial collection. No hydrocephalus. Vascular: Hyperdense right carotid terminus and right ACA MCA. Skull: Normal Sinuses/Orbits: Negative Other:  None IMPRESSION: Similar appearance to earlier. Low-density and swelling of the right hemisphere in the ACA and MCA territories with mass effect and right-to-left shift of 5-6 mm. No hemorrhagic transformation at this time. Electronically Signed   By: Nelson Chimes M.D.   On: 07/19/2019 16:01   Ct Head Wo Contrast  Result Date: 07/19/2019 CLINICAL DATA:  43 year old male with neurologic deficit greater than 6 hours. Hemiparesis and aphasia. EXAM: CT HEAD WITHOUT CONTRAST CT CERVICAL SPINE WITHOUT CONTRAST TECHNIQUE: Multidetector CT imaging of the head and cervical spine was performed following the standard protocol without intravenous contrast. Multiplanar CT image reconstructions of the cervical spine were also generated. COMPARISON:  Report of neck CT 09/21/2014 (no images  available). Report of head CT 07/23/2001 (no images available). FINDINGS: CT HEAD FINDINGS Brain: Confluent cytotoxic edema in the right hemisphere affecting the right ACA and right MCA territories. Mass effect on the right lateral ventricle and trace leftward midline shift (series 3, image 21). The right PCA territory appears mostly spared. There is right deep gray nuclei involvement. The left hemisphere and posterior fossa gray-white matter differentiation is preserved. No ventriculomegaly. Basilar cisterns remain patent. No acute intracranial hemorrhage identified. Vascular: Asymmetric hyperdensity at the right ICA terminus and involving the right A1, right M1 segments and also some right MCA branches (series 5, image 32). Skull: Negative. Sinuses/Orbits: Mild mucosal thickening in the left maxillary sinus. Other paranasal sinuses, tympanic cavities and mastoids are clear. Other: No acute orbit or scalp soft tissue finding. CT CERVICAL SPINE FINDINGS Alignment: Straightening of cervical lordosis. Cervicothoracic junction alignment is within normal limits. Bilateral posterior element alignment is within normal limits. Skull base and vertebrae: Visualized skull base is intact. No atlanto-occipital dissociation. No acute osseous abnormality identified. Soft tissues and spinal canal: No prevertebral fluid or swelling. No visible canal hematoma. Negative noncontrast neck soft tissues. Disc levels: Mild cervical spine degeneration. Foraminal disc and endplate spurring most pronounced at C3-C4. Upper chest: Visible upper thoracic levels and left lung apex appear negative. IMPRESSION: 1. 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 2. No associated hemorrhage and mild intracranial mass effect at this time, including trace leftward midline shift. 3. Evidence of large vessel occlusion: Hyperdensity of the right ICA terminus, the right A1 and MCA. 4. Unaffected brain  parenchyma appears negative. 5.  No acute traumatic injury identified in the cervical spine. Critical Value/emergent results were called by telephone at the time of interpretation on 07/19/2019 at 11:45 am to Dr. Virgel Manifold , who verbally acknowledged these results. Electronically Signed   By: Genevie Ann M.D.   On: 07/19/2019 11:46   Ct Cervical Spine Wo Contrast  Result Date: 07/19/2019 CLINICAL DATA:  43 year old male with neurologic deficit greater than 6 hours. Hemiparesis and aphasia. EXAM: CT HEAD WITHOUT CONTRAST CT CERVICAL SPINE WITHOUT CONTRAST TECHNIQUE: Multidetector CT imaging of the head and cervical spine was performed following the standard protocol without intravenous contrast. Multiplanar CT image reconstructions of the cervical spine were also generated. COMPARISON:  Report of neck CT 09/21/2014 (no images available). Report of head CT 07/23/2001 (no images available). FINDINGS: CT HEAD FINDINGS Brain: Confluent cytotoxic edema in the right hemisphere affecting the right ACA and right MCA territories. Mass effect on the right lateral ventricle and trace leftward midline shift (series 3, image 21). The right PCA territory appears mostly spared. There is right deep gray nuclei involvement. The left hemisphere and posterior fossa gray-white matter differentiation is  preserved. No ventriculomegaly. Basilar cisterns remain patent. No acute intracranial hemorrhage identified. Vascular: Asymmetric hyperdensity at the right ICA terminus and involving the right A1, right M1 segments and also some right MCA branches (series 5, image 32). Skull: Negative. Sinuses/Orbits: Mild mucosal thickening in the left maxillary sinus. Other paranasal sinuses, tympanic cavities and mastoids are clear. Other: No acute orbit or scalp soft tissue finding. CT CERVICAL SPINE FINDINGS Alignment: Straightening of cervical lordosis. Cervicothoracic junction alignment is within normal limits. Bilateral posterior element  alignment is within normal limits. Skull base and vertebrae: Visualized skull base is intact. No atlanto-occipital dissociation. No acute osseous abnormality identified. Soft tissues and spinal canal: No prevertebral fluid or swelling. No visible canal hematoma. Negative noncontrast neck soft tissues. Disc levels: Mild cervical spine degeneration. Foraminal disc and endplate spurring most pronounced at C3-C4. Upper chest: Visible upper thoracic levels and left lung apex appear negative. IMPRESSION: 1. 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 2. No associated hemorrhage and mild intracranial mass effect at this time, including trace leftward midline shift. 3. Evidence of large vessel occlusion: Hyperdensity of the right ICA terminus, the right A1 and MCA. 4. Unaffected brain parenchyma appears negative. 5.  No acute traumatic injury identified in the cervical spine. Critical Value/emergent results were called by telephone at the time of interpretation on 07/19/2019 at 11:45 am to Dr. Virgel Manifold , who verbally acknowledged these results. Electronically Signed   By: Genevie Ann M.D.   On: 07/19/2019 11:46   Mr Angio Head Wo Contrast  Result Date: 07/19/2019 CLINICAL DATA:  43 year old male found down this morning by coworker. 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 EXAM: MRI HEAD WITHOUT CONTRAST MRA HEAD WITHOUT CONTRAST MRA NECK WITHOUT CONTRAST TECHNIQUE: Multiplanar, multiecho pulse sequences of the brain and surrounding structures were obtained without intravenous contrast. Angiographic images of the Circle of Willis were obtained using MRA technique without intravenous contrast. Angiographic images of the neck were obtained using MRA technique without intravenous contrast. Carotid stenosis measurements (when applicable) are obtained utilizing NASCET criteria,  using the distal internal carotid diameter as the denominator. COMPARISON:  Plain head CT 1128 hours today. FINDINGS: MRI HEAD FINDINGS Brain: Confluent restricted diffusion throughout the right hemisphere sparing most of the right PCA territory; there are few punctate areas of restricted diffusion in the right occipital pole (series 5, image 70). The right basal ganglia and internal capsule are affected. The right thalamus is spared. No left hemisphere or posterior fossa restricted diffusion. Associated T2 and FLAIR hyperintensity reflecting cytotoxic edema. No convincing hemorrhage; mild asymmetry of susceptibility weighted images felt related to some right hemisphere vascular engorgement. Trace leftward midline shift and mild mass effect on the right lateral ventricle. Incidental cavum septum pellucidum. Minimal mass effect on the suprasellar cistern. Other basilar cisterns are patent. No ventriculomegaly. Outside of the affected territory gray and white matter signal is normal. Negative pituitary. Vascular: The right ICA flow void is lost in the neck and through the siphon. The other Major intracranial vascular flow voids are preserved. See below. Skull and upper cervical spine: Negative visible cervical spine. Normal bone marrow signal. Sinuses/Orbits: Negative orbits. Trace paranasal sinus mucosal thickening. Other: Mastoids are clear. Scalp and face soft tissues appear negative. MRA NECK FINDINGS Time-of-flight images in the neck demonstrate antegrade flow signal in both common carotid arteries and both cervical vertebral  arteries. From the level of the right carotid bifurcation there is absent flow signal in the right ICA. Preserved flow signal in the right ECA. The left carotid bifurcation and left ICA appear normal. Antegrade flow continues in the vertebral arteries to the skull base. MRA HEAD FINDINGS Antegrade flow in the posterior circulation with codominant distal vertebral arteries. Patent  vertebrobasilar junction. Patent basilar artery. Motion artifact at the distal basilar which seems to remain patent. The basilar tip and PCA origins appear to remain patent, but detail is obscured. Above the level of artifact there is flow signal detected in both PCAs, greater on the right (likely due to luxury perfusion on that side). At the skull base there is antegrade flow in the left ICA siphon, but no right ICA flow signal. Motion artifact beginning at the cavernous segment, but antegrade flow continues on the left to the level of the left ICA terminus. The left MCA and ACA origin are patent. Visible left MCA and ACA branches appear within normal limits. There is no flow signal at the right ICA terminus, right ACA or right MCA. IMPRESSION: 1. 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 The right ICA terminus, right ACA and MCA also appear occluded. 2. 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 Cytotoxic edema but no convincing hemorrhage. Stable mild mass effect with trace leftward midline shift. 3. Intracranial MRA is degraded by motion artifact, but no other large vessel occlusion is suspected. There appears to be lobes reperfusion of the right PCA. 4. The left hemisphere and posterior fossa brain parenchyma appears normal. Electronically Signed   By: Genevie Ann M.D.   On: 07/19/2019 14:00   Mr Angio Neck Wo Contrast  Result Date: 07/19/2019 CLINICAL DATA:  43 year old male found down this morning by coworker. 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 EXAM: MRI HEAD WITHOUT CONTRAST MRA HEAD WITHOUT CONTRAST MRA NECK WITHOUT CONTRAST TECHNIQUE: Multiplanar, multiecho pulse sequences of the brain and surrounding structures were obtained without intravenous contrast. Angiographic images of the Circle of Willis were obtained using MRA technique without intravenous contrast. Angiographic images  of the neck were obtained using MRA technique without intravenous contrast. Carotid stenosis measurements (when applicable) are obtained utilizing NASCET criteria, using the distal internal carotid diameter as the denominator. COMPARISON:  Plain head CT 1128 hours today. FINDINGS: MRI HEAD FINDINGS Brain: Confluent restricted diffusion throughout the right hemisphere sparing most of the right PCA territory; there are few punctate areas of restricted diffusion in the right occipital pole (series 5, image 70). The right basal ganglia and internal capsule are affected. The right thalamus is spared. No left hemisphere or posterior fossa restricted diffusion. Associated T2 and FLAIR hyperintensity reflecting cytotoxic edema. No convincing hemorrhage; mild asymmetry of susceptibility weighted images felt related to some right hemisphere vascular engorgement. Trace leftward midline shift and mild mass effect on the right lateral ventricle. Incidental cavum septum pellucidum. Minimal mass effect on the suprasellar cistern. Other basilar cisterns are patent. No ventriculomegaly. Outside of the affected territory gray and white matter signal is normal. Negative pituitary. Vascular: The right ICA flow void is lost in the neck and through the siphon. The other Major intracranial vascular flow voids are preserved. See below. Skull and upper cervical spine: Negative visible cervical spine. Normal bone marrow signal. Sinuses/Orbits: Negative orbits. Trace paranasal sinus mucosal thickening. Other: Mastoids are clear. Scalp and face soft  tissues appear negative. MRA NECK FINDINGS Time-of-flight images in the neck demonstrate antegrade flow signal in both common carotid arteries and both cervical vertebral arteries. From the level of the right carotid bifurcation there is absent flow signal in the right ICA. Preserved flow signal in the right ECA. The left carotid bifurcation and left ICA appear normal. Antegrade flow continues in  the vertebral arteries to the skull base. MRA HEAD FINDINGS Antegrade flow in the posterior circulation with codominant distal vertebral arteries. Patent vertebrobasilar junction. Patent basilar artery. Motion artifact at the distal basilar which seems to remain patent. The basilar tip and PCA origins appear to remain patent, but detail is obscured. Above the level of artifact there is flow signal detected in both PCAs, greater on the right (likely due to luxury perfusion on that side). At the skull base there is antegrade flow in the left ICA siphon, but no right ICA flow signal. Motion artifact beginning at the cavernous segment, but antegrade flow continues on the left to the level of the left ICA terminus. The left MCA and ACA origin are patent. Visible left MCA and ACA branches appear within normal limits. There is no flow signal at the right ICA terminus, right ACA or right MCA. IMPRESSION: 1. 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 The right ICA terminus, right ACA and MCA also appear occluded. 2. 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 Cytotoxic edema but no convincing hemorrhage. Stable mild mass effect with trace leftward midline shift. 3. Intracranial MRA is degraded by motion artifact, but no other large vessel occlusion is suspected. There appears to be lobes reperfusion of the right PCA. 4. The left hemisphere and posterior fossa brain parenchyma appears normal. Electronically Signed   By: Genevie Ann M.D.   On: 07/19/2019 14:00   Mr Brain Wo Contrast  Result Date: 07/19/2019 CLINICAL DATA:  43 year old male found down this morning by coworker. 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 EXAM: MRI HEAD WITHOUT CONTRAST MRA HEAD WITHOUT CONTRAST MRA NECK WITHOUT CONTRAST TECHNIQUE: Multiplanar, multiecho pulse sequences of the brain and surrounding structures were obtained without  intravenous contrast. Angiographic images of the Circle of Willis were obtained using MRA technique without intravenous contrast. Angiographic images of the neck were obtained using MRA technique without intravenous contrast. Carotid stenosis measurements (when applicable) are obtained utilizing NASCET criteria, using the distal internal carotid diameter as the denominator. COMPARISON:  Plain head CT 1128 hours today. FINDINGS: MRI HEAD FINDINGS Brain: Confluent restricted diffusion throughout the right hemisphere sparing most of the right PCA territory; there are few punctate areas of restricted diffusion in the right occipital pole (series 5, image 70). The right basal ganglia and internal capsule are affected. The right thalamus is spared. No left hemisphere or posterior fossa restricted diffusion. Associated T2 and FLAIR hyperintensity reflecting cytotoxic edema. No convincing hemorrhage; mild asymmetry of susceptibility weighted images felt related to some right hemisphere vascular engorgement. Trace leftward midline shift and mild mass effect on the right lateral ventricle. Incidental cavum septum pellucidum. Minimal mass effect on the suprasellar cistern. Other basilar cisterns are patent. No ventriculomegaly. Outside of the affected territory gray and white matter signal is normal. Negative pituitary. Vascular: The right ICA flow void is lost in the neck and through the siphon. The other Major intracranial vascular flow voids are preserved. See below. Skull and upper cervical spine: Negative  visible cervical spine. Normal bone marrow signal. Sinuses/Orbits: Negative orbits. Trace paranasal sinus mucosal thickening. Other: Mastoids are clear. Scalp and face soft tissues appear negative. MRA NECK FINDINGS Time-of-flight images in the neck demonstrate antegrade flow signal in both common carotid arteries and both cervical vertebral arteries. From the level of the right carotid bifurcation there is absent flow  signal in the right ICA. Preserved flow signal in the right ECA. The left carotid bifurcation and left ICA appear normal. Antegrade flow continues in the vertebral arteries to the skull base. MRA HEAD FINDINGS Antegrade flow in the posterior circulation with codominant distal vertebral arteries. Patent vertebrobasilar junction. Patent basilar artery. Motion artifact at the distal basilar which seems to remain patent. The basilar tip and PCA origins appear to remain patent, but detail is obscured. Above the level of artifact there is flow signal detected in both PCAs, greater on the right (likely due to luxury perfusion on that side). At the skull base there is antegrade flow in the left ICA siphon, but no right ICA flow signal. Motion artifact beginning at the cavernous segment, but antegrade flow continues on the left to the level of the left ICA terminus. The left MCA and ACA origin are patent. Visible left MCA and ACA branches appear within normal limits. There is no flow signal at the right ICA terminus, right ACA or right MCA. IMPRESSION: 1. 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 The right ICA terminus, right ACA and MCA also appear occluded. 2. 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 Cytotoxic edema but no convincing hemorrhage. Stable mild mass effect with trace leftward midline shift. 3. Intracranial MRA is degraded by motion artifact, but no other large vessel occlusion is suspected. There appears to be lobes reperfusion of the right PCA. 4. The left hemisphere and posterior fossa brain parenchyma appears normal. Electronically Signed   By: Genevie Ann M.D.   On: 07/19/2019 14:00    Pending Labs Unresulted Labs (From admission, onward)    Start     Ordered   07/20/19 0500  Hemoglobin A1c  Tomorrow morning,   R     07/19/19 1514   07/20/19 0500  Lipid panel  Tomorrow morning,   R    Comments: Fasting    07/19/19 1514   07/19/19 1642  CK  Once,   R     07/19/19 1642    07/19/19 1553  Culture, Urine  Once,   STAT     07/19/19 1553   07/19/19 1553  Urinalysis, Routine w reflex microscopic  Once,   STAT     07/19/19 1553   07/19/19 1553  Expectorated sputum assessment w rflx to resp cult  Once,   R     07/19/19 1553   07/19/19 1552  Procalcitonin - Baseline  ONCE - STAT,   STAT     07/19/19 1553   07/19/19 1552  Culture, blood (Routine X 2) w Reflex to ID Panel  BLOOD CULTURE X 2,   STAT     07/19/19 1553   07/19/19 1544  Sedimentation rate  Once,   STAT     07/19/19 1544   07/19/19 1511  HIV antibody (Routine Testing)  Once,   STAT     07/19/19 1513   07/19/19 1329  Sodium  Now then every 6 hours,   R (with STAT occurrences)     07/19/19 1328   07/19/19 0946  SARS CORONAVIRUS 2 (TAT 6-12 HRS)  Nasal Swab Aptima Multi Swab  (Asymptomatic/Tier 2 Patients Labs)  Once,   STAT    Question Answer Comment  Is this test for diagnosis or screening Screening   Symptomatic for COVID-19 as defined by CDC No   Hospitalized for COVID-19 No   Admitted to ICU for COVID-19 No   Previously tested for COVID-19 No   Resident in a congregate (group) care setting No   Employed in healthcare setting No      07/19/19 0946          Vitals/Pain Today's Vitals   07/19/19 1400 07/19/19 1415 07/19/19 1449 07/19/19 1700  BP: 126/83 112/80  (!) 116/45  Pulse: 74   70  Resp: (!) 31 (!) 30  (!) 24  Temp:   (!) 102.6 F (39.2 C)   TempSrc:   Rectal   SpO2: 96%   96%  PainSc:        Isolation Precautions No active isolations  Medications Medications  0.9 %  sodium chloride infusion ( Intravenous Hold 07/19/19 1432)  sodium chloride (hypertonic) 3 % solution (50 mL/hr Intravenous New Bag/Given 07/19/19 1443)  0.9 %  sodium chloride infusion (has no administration in time range)  acetaminophen (TYLENOL) tablet 650 mg ( Oral See Alternative 07/19/19 1644)    Or  acetaminophen (TYLENOL) solution 650 mg ( Per Tube See Alternative 07/19/19 1644)    Or  acetaminophen  (TYLENOL) suppository 650 mg (650 mg Rectal Given 07/19/19 1644)  senna-docusate (Senokot-S) tablet 1 tablet (has no administration in time range)  LORazepam (ATIVAN) injection 1 mg (1 mg Intravenous Given 07/19/19 1220)  aspirin suppository 300 mg (300 mg Rectal Given 07/19/19 1434)  fentaNYL (SUBLIMAZE) injection 50 mcg (50 mcg Intravenous Given 07/19/19 1428)   stroke: mapping our early stages of recovery book ( Does not apply Given 07/19/19 1600)    Mobility non-ambulatory High fall risk   Focused Assessments Neuro Assessment Handoff:  Swallow screen pass? No  Cardiac Rhythm: Normal sinus rhythm NIH Stroke Scale ( + Modified Stroke Scale Criteria)  Interval: Initial Level of Consciousness (1a.)   : Not alert, but arousable by minor stimulation to obey, answer, or respond LOC Questions (1b. )   +: Answers neither question correctly(pt nonverbal at this time) LOC Commands (1c. )   + : Performs one task correctly Best Gaze (2. )  +: Normal(pt not following commands to assess gaze) Visual (3. )  +: Partial hemianopia Facial Palsy (4. )    : Minor paralysis(pt drooling from left side of mouth) Motor Arm, Left (5a. )   +: No movement Motor Arm, Right (5b. )   +: Some effort against gravity Motor Leg, Left (6a. )   +: No movement Motor Leg, Right (6b. )   +: Some effort against gravity Limb Ataxia (7. ): Absent(pt unable to follow commands to assess ataxia) Sensory (8. )   +: Mild-to-moderate sensory loss, patient feels pinprick is less sharp or is dull on the affected side, or there is a loss of superficial pain with pinprick, but patient is aware of being touched Best Language (9. )   +: Mute, global aphasia Dysarthria (10. ): Severe dysarthria, patient's speech is so slurred as to be unintelligible in the absence of or out of proportion to any dysphasia, or is mute/anarthric Extinction/Inattention (11.)   +: No Abnormality(pt unable to verbalize sensation of touch) Modified SS Total  +:  20 Complete NIHSS TOTAL: 24     Neuro Assessment:  Neuro Checks:   Initial (07/19/19 1138)  Last Documented NIHSS Modified Score: 20 (07/19/19 1138) Has TPA been given? No If patient is a Neuro Trauma and patient is going to OR before floor call report to [facility] nurse: [phone] or [phone]     R Recommendations: See Admitting Provider Note  Report given to:   Additional Notes:

## 2019-07-19 NOTE — Brief Op Note (Signed)
07/19/2019  11:27 PM  PATIENT:  Matthew Black  43 y.o. male  PRE-OPERATIVE DIAGNOSIS:  stroke  POST-OPERATIVE DIAGNOSIS:  stroke  PROCEDURE:  Procedure(s): RIGHT HEMI-CRANIECTOMY With implantation of skull flap to abdominal wall (Right)  SURGEON:  Surgeon(s) and Role:    Consuella Lose, MD - Primary  ASSISTANTS: Ferne Reus, PA-C   ANESTHESIA:   general  EBL:  250 mL   BLOOD ADMINISTERED:none  DRAINS: none   LOCAL MEDICATIONS USED:  MARCAINE    and LIDOCAINE   SPECIMEN:  No Specimen  DISPOSITION OF SPECIMEN:  N/A  COUNTS:  YES  TOURNIQUET:  * No tourniquets in log *  DICTATION: .Note written in EPIC  PLAN OF CARE: return back to ICU  PATIENT DISPOSITION:  ICU - intubated and hemodynamically stable.   Delay start of Pharmacological VTE agent (>24hrs) due to surgical blood loss or risk of bleeding: yes

## 2019-07-19 NOTE — Progress Notes (Signed)
Neurology Progress Note   S:// Patient seen and examined. Signed out by Dr. Cheral Marker to check up on exam as well as repeat imaging. Patient more lethargic than before but still following commands.  Keeps eyes closed.   O:// Current vital signs: BP 116/82   Pulse 82   Temp 98.2 F (36.8 C) (Axillary)   Resp (!) 31   SpO2 97%  Vital signs in last 24 hours: Temp:  [97.8 F (36.6 C)-103.1 F (39.5 C)] 98.2 F (36.8 C) (08/25 2000) Pulse Rate:  [57-82] 82 (08/25 2100) Resp:  [24-33] 31 (08/25 2100) BP: (112-136)/(34-83) 116/82 (08/25 2100) SpO2:  [94 %-98 %] 97 % (08/25 2100) Neurological exam Patient is very drowsy, he does follow commands but keeps his eyes closed and does not wake up completely. Speech is moderately dysarthric. Poor attention concentration Cranial nerves: Right gaze preference, pupils equal and reactive, blinks to threat from the right but not from the left, no significant facial droop at rest. Motor exam: Left upper extremity is flaccid 0/5.  Left lower extremity 1-2/5.  Right side moves purposefully to command and is at least 4/5. Sensory exam: Decreased withdrawal to pain on the left. Cerebellar unable to test due to cooperation. Gait testing deferred at this time  Medications  Current Facility-Administered Medications:  .  0.9 %  sodium chloride infusion, , Intravenous, Continuous, Virgel Manifold, MD, Stopped at 07/19/19 1432 .  0.9 %  sodium chloride infusion, , Intravenous, Continuous, Kerney Elbe, MD .  acetaminophen (TYLENOL) tablet 650 mg, 650 mg, Oral, Q4H PRN **OR** acetaminophen (TYLENOL) solution 650 mg, 650 mg, Per Tube, Q4H PRN **OR** acetaminophen (TYLENOL) suppository 650 mg, 650 mg, Rectal, Q4H PRN, Kerney Elbe, MD, 650 mg at 07/19/19 1644 .  Chlorhexidine Gluconate Cloth 2 % PADS 6 each, 6 each, Topical, Daily, Kerney Elbe, MD .  senna-docusate (Senokot-S) tablet 1 tablet, 1 tablet, Oral, QHS PRN, Kerney Elbe, MD .  sodium chloride  (hypertonic) 3 % solution, , Intravenous, Continuous, Kerney Elbe, MD, Last Rate: 50 mL/hr at 07/19/19 1447 Labs CBC    Component Value Date/Time   WBC 16.7 (H) 07/19/2019 1040   RBC 5.88 (H) 07/19/2019 1040   HGB 16.8 07/19/2019 1040   HCT 50.2 07/19/2019 1040   PLT 275 07/19/2019 1040   MCV 85.4 07/19/2019 1040   MCH 28.6 07/19/2019 1040   MCHC 33.5 07/19/2019 1040   RDW 14.6 07/19/2019 1040   LYMPHSABS 1.3 07/19/2019 1040   MONOABS 1.2 (H) 07/19/2019 1040   EOSABS 0.0 07/19/2019 1040   BASOSABS 0.0 07/19/2019 1040    CMP     Component Value Date/Time   NA 143 07/19/2019 1920   K 3.8 07/19/2019 1454   CL 107 07/19/2019 1454   CO2 22 07/19/2019 1454   GLUCOSE 116 (H) 07/19/2019 1454   BUN 18 07/19/2019 1454   CREATININE 1.43 (H) 07/19/2019 1454   CALCIUM 9.7 07/19/2019 1454   PROT 7.9 07/19/2019 1454   ALBUMIN 4.6 07/19/2019 1454   AST 125 (H) 07/19/2019 1454   ALT 50 (H) 07/19/2019 1454   ALKPHOS 82 07/19/2019 1454   BILITOT 1.3 (H) 07/19/2019 1454   GFRNONAA >60 07/19/2019 1454   GFRAA >60 07/19/2019 1454     Imaging I have reviewed images in epic and the results pertinent to this consultation are: CT of the brain with large right hemispheric stroke involving the MCA and ACA territories with about 5 mm right to left midline shift.  No hydrocephalus.  MRI confirms the findings above. MRA head and neck with right ICA occlusion from its origin, right ICA terminus occlusion, right ACA and right MCA occlusion.  Large right hemispheric infarct sparing the right PCA territory.  Cytotoxic edema but no convincing hemorrhage.  Stable mass-effect and leftward midline shift.  Left-sided vasculature appears patent on the MRI.  Assessment: 43 year old with no significant past medical history presenting with left-sided weakness after being found down at work.  I spoke with his wife over the phone in detail who said that he was last seen normal possibly sometime in the afternoon  of 07/18/2019 and then found unresponsive at work this morning prior to presentation. Given the significant changes on imaging and increasing drowsiness, I discussed the case with Dr. Kathyrn Sheriff from neurosurgery regarding decompressive hemicraniectomy and he has agreed to discuss with wife and pursue once she consents. I had a detailed conversation with the wife explaining her the purpose of the decompressive hemicraniectomy to be lifesaving and has not shown to affect functional outcomes.  She verbalized understanding.  Impression:  -Acute ischemic stroke due to right ICA, MCA and ACA occlusion.  Etiology under investigation. -Right ICA, MCA and ACA occlusion. -Cerebral edema  Recommendations: Continue hypertonic saline Emergent decompressive hemicraniectomy per neurosurgery.  Appreciate prompt evaluation and discussion with the family by Dr. Kathyrn Sheriff and team. We will continue to follow him after the OR. Repeat head CT in the morning or earlier if indicated by neurosurgery. The wife was informed of the plan, her questions answered. Case was discussed in detail over the phone and in person with Dr. Kathyrn Sheriff.  -- Amie Portland, MD [facility] Pager: [phone] If 7pm to 7am, please call on call as listed on AMION.  CRITICAL CARE ATTESTATION Performed by: Amie Portland, MD Total critical care time: Additional 40 minutes  Critical care time was exclusive of separately billable procedures and treating other patients and/or supervising APPs/Residents/Students Critical care was necessary to treat or prevent imminent or life-threatening deterioration due to acute ischemic stroke, cerebral edema This patient is critically ill and at significant risk for neurological worsening and/or death and care requires constant monitoring. Critical care was time spent personally by me on the following activities: development of treatment plan with patient and/or surrogate as well as nursing,  discussions with consultants, evaluation of patient's response to treatment, examination of patient, obtaining history from patient or surrogate, ordering and performing treatments and interventions, ordering and review of laboratory studies, ordering and review of radiographic studies, pulse oximetry, re-evaluation of patient's condition, participation in multidisciplinary rounds and medical decision making of high complexity in the care of this patient.

## 2019-07-19 NOTE — Consult Note (Signed)
Chief Complaint   Chief Complaint  Patient presents with   Altered Mental Status    HPI   Consult requested by: Dr Carloyn Jaeger  Reason for consult: right hemispheric infarct, cerebral edema  HPI: Matthew Black is a 43 y.o. male with no known past medical history who was found down at work by a coworker at [employer] covered in vomit and urine. Last seen normal by wife 830am on 8/24. 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 NSY consultation requested for possible hemicraniectomy. Patient is aphasic and unable to provide any history.   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    PSH: Past Surgical History:  Procedure Laterality Date   CYST EXCISION N/A 10/08/2016   Procedure: EXCISION OF POSTERIOR NECK CYST;  Surgeon: Clovis Riley, MD;  Location: [facility];  Service: General;  Laterality: N/A;   INCISION AND DRAINAGE ABSCESS N/A 09/22/2014   Procedure: INCISION AND DRAINAGE ABSCESS POSTERIOR NECK;  Surgeon: Pedro Earls, MD;  Location: [facility];  Service: General;  Laterality: N/A;   INCISION AND DRAINAGE ABSCESS N/A 12/20/2015   Procedure: INCISION AND DRAINAGE POSTERIOR NECK MASS;  Surgeon: Armandina Gemma, MD;  Location: [facility];  Service: General;  Laterality: N/A;   INCISION AND DRAINAGE ABSCESS Left 07/10/2004   middle finger   MASS EXCISION N/A 07/21/2017   Procedure: EXCISION OF BENIGN NECK LESION WITH LAYERED CLOSURE;  Surgeon: Irene Limbo, MD;  Location: [facility];   Service: Plastics;  Laterality: N/A;   MASS EXCISION N/A 11/10/2017   Procedure: EXCISION BENIGN LESION OF THE NECK WITH LAYERED CLOSURE;  Surgeon: Irene Limbo, MD;  Location: [facility];  Service: Plastics;  Laterality: N/A;    Medications Prior to Admission  Medication Sig Dispense Refill Last Dose   ibuprofen (ADVIL,MOTRIN) 800 MG tablet Take 1 tablet (800 mg total) by mouth 3 (three) times daily. (Patient not taking: Reported on 07/19/2019) 21 tablet 0 Not Taking at Unknown time   oseltamivir (TAMIFLU) 75 MG capsule Take 1 capsule (75 mg total) by mouth every 12 (twelve) hours. (Patient not taking: Reported on 07/19/2019) 10 capsule 0 Not Taking at Unknown time    SH: Social History   Tobacco Use   Smoking status: Former Smoker    Packs/day: 0.00    Quit date: 11/24/2015    Years since quitting: 3.6   Smokeless tobacco: Never Used   Tobacco comment:    Substance Use Topics   Alcohol use: Yes    Comment: occasionally   Drug use: Never    MEDS: Prior to Admission medications   Medication Sig Start Date End Date Taking? Authorizing Provider  ibuprofen (ADVIL,MOTRIN) 800 MG tablet Take 1 tablet (800 mg total) by mouth 3 (three) times daily. Patient not taking: Reported on 07/19/2019 01/23/19   Raylene Everts, MD  oseltamivir (TAMIFLU) 75 MG capsule Take 1 capsule (75 mg total) by mouth every 12 (twelve) hours. Patient not taking: Reported on  07/19/2019 01/23/19   Raylene Everts, MD    ALLERGY: Allergies  Allergen Reactions   Hydrocodone Nausea Only    Social History   Tobacco Use   Smoking status: Former Smoker    Packs/day: 0.00    Quit date: 11/24/2015    Years since quitting: 3.6   Smokeless tobacco: Never Used   Tobacco comment:    Substance Use Topics   Alcohol use: Yes    Comment: occasionally     Family History  Problem Relation Age of Onset   Diabetes Other        GF   Healthy Mother    Healthy Father       ROS   ROS aphasic, unable to obtain  Exam   Vitals:   07/19/19 1900 07/19/19 2000  BP: (!) 135/34 127/64  Pulse: 63 73  Resp: (!) 25 (!) 33  Temp:  98.2 F (36.8 C)  SpO2: 94% 97%   WDWN, NAD GCS: 8 [id number] Eyes closed, does not open spontaneously/command Difficult to assess tracking with eyes closed Blinks to threat right, not on left  PERRL CN grossly intact although difficult to assess Moves RUE/RLE to command with 5/5 strength Does not move LUE/LLE to command. Does extend LLE to central pain Unable to assess FNF Plantars down going  Results - Imaging/Labs   Results for orders placed or performed during the hospital encounter of 07/19/19 (from the past 48 hour(s))  SARS CORONAVIRUS 2 (TAT 6-12 HRS) Nasal Swab Aptima Multi Swab     Status: None   Collection Time: 07/19/19  9:46 AM   Specimen: Aptima Multi Swab; Nasal Swab  Result Value Ref Range   SARS Coronavirus 2 NEGATIVE NEGATIVE    Comment: (NOTE) SARS-CoV-2 target nucleic acids are NOT DETECTED. The SARS-CoV-2 RNA is generally detectable in upper and lower respiratory specimens during the acute phase of infection. Negative results do not preclude SARS-CoV-2 infection, do not rule out co-infections with other pathogens, and should not be used as the sole basis for treatment or other patient management decisions. Negative results must be combined with clinical observations, patient history, and epidemiological information. The expected result is Negative. Fact Sheet for Patients: SugarRoll.be Fact Sheet for Healthcare Providers: https://www.woods-mathews.com/ This test is not yet approved or cleared by the [place] FDA and  has been authorized for detection and/or diagnosis of SARS-CoV-2 by FDA under an Emergency Use Authorization (EUA). This EUA will remain  in effect (meaning this test can be used) for the duration of the COVID-19 declaration under Section  56 4(b)(1) of the Act, 21 U.S.C. section 360bbb-3(b)(1), unless the authorization is terminated or revoked sooner. Performed at [facility], [place] [address] [place], [place] [address]   CBG monitoring, ED     Status: Abnormal   Collection Time: 07/19/19  9:51 AM  Result Value Ref Range   Glucose-Capillary 123 (H) 70 - 99 mg/dL   Comment 1 Notify RN    Comment 2 Document in Chart   CBC with Differential     Status: Abnormal   Collection Time: 07/19/19 10:40 AM  Result Value Ref Range   WBC 16.7 (H) 4.0 - 10.5 K/uL   RBC 5.88 (H) 4.22 - 5.81 MIL/uL   Hemoglobin 16.8 13.0 - 17.0 g/dL   HCT 50.2 39.0 - 52.0 %   MCV 85.4 80.0 - 100.0 fL   MCH 28.6 26.0 - 34.0 pg   MCHC 33.5 30.0 - 36.0 g/dL  RDW 14.6 11.5 - 15.5 %   Platelets 275 150 - 400 K/uL   nRBC 0.0 0.0 - 0.2 %   Neutrophils Relative % 85 %   Neutro Abs 14.2 (H) 1.7 - 7.7 K/uL   Lymphocytes Relative 8 %   Lymphs Abs 1.3 0.7 - 4.0 K/uL   Monocytes Relative 7 %   Monocytes Absolute 1.2 (H) 0.1 - 1.0 K/uL   Eosinophils Relative 0 %   Eosinophils Absolute 0.0 0.0 - 0.5 K/uL   Basophils Relative 0 %   Basophils Absolute 0.0 0.0 - 0.1 K/uL   Immature Granulocytes 0 %   Abs Immature Granulocytes 0.07 0.00 - 0.07 K/uL    Comment: Performed at [facility] [address] [place], [place] [address]  Ethanol     Status: None   Collection Time: 07/19/19 10:40 AM  Result Value Ref Range   Alcohol, Ethyl (B) <10 <10 mg/dL    Comment: (NOTE) Lowest detectable limit for serum alcohol is 10 mg/dL. For medical purposes only. Performed at [facility], [place] [address] [place], [place] [address]   Urinalysis, Routine w reflex microscopic     Status: Abnormal   Collection Time: 07/19/19  1:48 PM  Result Value Ref Range   Color, Urine YELLOW YELLOW   APPearance TURBID (A) CLEAR   Specific Gravity, Urine 1.030 1.005 - 1.030   pH 5.0 5.0 - 8.0   Glucose, UA NEGATIVE NEGATIVE mg/dL   Hgb urine dipstick MODERATE  (A) NEGATIVE   Bilirubin Urine NEGATIVE NEGATIVE   Ketones, ur NEGATIVE NEGATIVE mg/dL   Protein, ur 30 (A) NEGATIVE mg/dL   Nitrite NEGATIVE NEGATIVE   Leukocytes,Ua NEGATIVE NEGATIVE   RBC / HPF 0-5 0 - 5 RBC/hpf   WBC, UA 0-5 0 - 5 WBC/hpf   Bacteria, UA NONE SEEN NONE SEEN   Mucus PRESENT    Amorphous Crystal PRESENT     Comment: Performed at [facility] [address], [place], [place] [address]  Rapid urine drug screen (hospital performed)     Status: Abnormal   Collection Time: 07/19/19  1:48 PM  Result Value Ref Range   Opiates NONE DETECTED NONE DETECTED   Cocaine NONE DETECTED NONE DETECTED   Benzodiazepines NONE DETECTED NONE DETECTED   Amphetamines NONE DETECTED NONE DETECTED   Tetrahydrocannabinol POSITIVE (A) NONE DETECTED   Barbiturates NONE DETECTED NONE DETECTED    Comment: (NOTE) DRUG SCREEN FOR MEDICAL PURPOSES ONLY.  IF CONFIRMATION IS NEEDED FOR ANY PURPOSE, NOTIFY LAB WITHIN 5 DAYS. LOWEST DETECTABLE LIMITS FOR URINE DRUG SCREEN Drug Class                     Cutoff (ng/mL) Amphetamine and metabolites    1000 Barbiturate and metabolites    200 Benzodiazepine                 [id number] Tricyclics and metabolites     300 Opiates and metabolites        300 Cocaine and metabolites        300 THC                            50 Performed at [facility], [place] [address], [place], [place] [address]   Comprehensive metabolic panel     Status: Abnormal   Collection Time: 07/19/19  2:54 PM  Result Value Ref Range  Sodium 143 135 - 145 mmol/L   Potassium 3.8 3.5 - 5.1 mmol/L   Chloride 107 98 - 111 mmol/L   CO2 22 22 - 32 mmol/L   Glucose, Bld 116 (H) 70 - 99 mg/dL   BUN 18 6 - 20 mg/dL   Creatinine, Ser 1.43 (H) 0.61 - 1.24 mg/dL   Calcium 9.7 8.9 - 10.3 mg/dL   Total Protein 7.9 6.5 - 8.1 g/dL   Albumin 4.6 3.5 - 5.0 g/dL   AST 125 (H) 15 - 41 U/L   ALT 50 (H) 0 - 44 U/L   Alkaline Phosphatase 82 38 - 126 U/L   Total Bilirubin 1.3 (H) 0.3 -  1.2 mg/dL   GFR calc non Af Amer >60 >60 mL/min   GFR calc Af Amer >60 >60 mL/min   Anion gap 14 5 - 15    Comment: Performed at [facility] [address] [place], [place] [address]  Sedimentation rate     Status: None   Collection Time: 07/19/19  4:42 PM  Result Value Ref Range   Sed Rate 5 0 - 16 mm/hr    Comment: Performed at [facility] [address], [place], [place] [address]  C-reactive protein     Status: Abnormal   Collection Time: 07/19/19  4:42 PM  Result Value Ref Range   CRP 1.3 (H) <1.0 mg/dL    Comment: Performed at [facility], [place] [address], [place], [place] [address]  Lactate dehydrogenase     Status: Abnormal   Collection Time: 07/19/19  4:42 PM  Result Value Ref Range   LDH 362 (H) 98 - 192 U/L    Comment: Performed at [facility], [place] [address], [place], [place] [address]  Ferritin     Status: None   Collection Time: 07/19/19  4:42 PM  Result Value Ref Range   Ferritin 198 24 - 336 ng/mL    Comment: Performed at [facility] [address] [place], [place] [address]  Procalcitonin - Baseline     Status: None   Collection Time: 07/19/19  4:42 PM  Result Value Ref Range   Procalcitonin <0.10 ng/mL    Comment:        Interpretation: PCT (Procalcitonin) <= 0.5 ng/mL: Systemic infection (sepsis) is not likely. Local bacterial infection is possible. (NOTE)       Sepsis PCT Algorithm           Lower Respiratory Tract                                      Infection PCT Algorithm    ----------------------------     ----------------------------         PCT < 0.25 ng/mL                PCT < 0.10 ng/mL         Strongly encourage             Strongly discourage   discontinuation of antibiotics    initiation of antibiotics    ----------------------------     -----------------------------       PCT 0.25 - 0.50 ng/mL            PCT 0.10 - 0.25 ng/mL               OR       >  80% decrease in PCT            Discourage initiation  of                                            antibiotics      Encourage discontinuation           of antibiotics    ----------------------------     -----------------------------         PCT >= 0.50 ng/mL              PCT 0.26 - 0.50 ng/mL               AND        <80% decrease in PCT             Encourage initiation of                                             antibiotics       Encourage continuation           of antibiotics    ----------------------------     -----------------------------        PCT >= 0.50 ng/mL                  PCT > 0.50 ng/mL               AND         increase in PCT                  Strongly encourage                                      initiation of antibiotics    Strongly encourage escalation           of antibiotics                                     -----------------------------                                           PCT <= 0.25 ng/mL                                                 OR                                        > 80% decrease in PCT                                     Discontinue / Do not initiate  antibiotics Performed at [facility], [place] [address], [place], [place] [address]   CK     Status: Abnormal   Collection Time: 07/19/19  4:42 PM  Result Value Ref Range   Total CK 7,533 (H) 49 - 397 U/L    Comment: RESULTS CONFIRMED BY MANUAL DILUTION Performed at [facility], [place] [address], [place], [place] [address]   Sodium     Status: None   Collection Time: 07/19/19  7:20 PM  Result Value Ref Range   Sodium 143 135 - 145 mmol/L    Comment: Performed at [facility] [address], [place],  [address]    Dg Chest 2 View  Result Date: 07/19/2019 CLINICAL DATA:  Found on floor. EXAM: CHEST - 2 VIEW COMPARISON:  09/21/2014 FINDINGS: Heart and mediastinal contours are within normal limits. No focal opacities or effusions. No acute bony abnormality. IMPRESSION:  No active cardiopulmonary disease. Electronically Signed   By: Rolm Baptise M.D.   On: 07/19/2019 15:50   Ct Head Wo Contrast  Result Date: 07/19/2019 CLINICAL DATA:  Follow-up stroke hemiparesis and aphasia. EXAM: CT HEAD WITHOUT CONTRAST TECHNIQUE: Contiguous axial images were obtained from the base of the skull through the vertex without intravenous contrast. COMPARISON:  CT and MRI studies same day FINDINGS: Brain: Progressive low-density and swelling within the knee complete right MCA and ACA territory affecting the right basal ganglia, frontal lobe, anterior temporal lobe and parietal lobe. Sparing only of the portion of the supratentorial brain supplied by the right PCA. Brain swelling but no hemorrhagic transformation. Mass effect with right-to-left shift of 5-6 mm. Hyperdense carotid terminus and right ACA MCA consistent with embolic occlusion. No extra-axial collection. No hydrocephalus. Vascular: Hyperdense right carotid terminus and right ACA MCA. Skull: Normal Sinuses/Orbits: Negative Other: None IMPRESSION: Similar appearance to earlier. Low-density and swelling of the right hemisphere in the ACA and MCA territories with mass effect and right-to-left shift of 5-6 mm. No hemorrhagic transformation at this time. Electronically Signed   By: Nelson Chimes M.D.   On: 07/19/2019 16:01   Ct Head Wo Contrast  Result Date: 07/19/2019 CLINICAL DATA:  43 year old male with neurologic deficit greater than 6 hours. Hemiparesis and aphasia. EXAM: CT HEAD WITHOUT CONTRAST CT CERVICAL SPINE WITHOUT CONTRAST TECHNIQUE: Multidetector CT imaging of the head and cervical spine was performed following the standard protocol without intravenous contrast. Multiplanar CT image reconstructions of the cervical spine were also generated. COMPARISON:  Report of neck CT 09/21/2014 (no images available). Report of head CT 07/23/2001 (no images available). FINDINGS: CT HEAD FINDINGS Brain: Confluent cytotoxic edema in the  right hemisphere affecting the right ACA and right MCA territories. Mass effect on the right lateral ventricle and trace leftward midline shift (series 3, image 21). The right PCA territory appears mostly spared. There is right deep gray nuclei involvement. The left hemisphere and posterior fossa gray-white matter differentiation is preserved. No ventriculomegaly. Basilar cisterns remain patent. No acute intracranial hemorrhage identified. Vascular: Asymmetric hyperdensity at the right ICA terminus and involving the right A1, right M1 segments and also some right MCA branches (series 5, image 32). Skull: Negative. Sinuses/Orbits: Mild mucosal thickening in the left maxillary sinus. Other paranasal sinuses, tympanic cavities and mastoids are clear. Other: No acute orbit or scalp soft tissue finding. CT CERVICAL SPINE FINDINGS Alignment: Straightening of cervical lordosis. Cervicothoracic junction alignment is within normal limits. Bilateral posterior element alignment is within normal limits. Skull base and vertebrae:  Visualized skull base is intact. No atlanto-occipital dissociation. No acute osseous abnormality identified. Soft tissues and spinal canal: No prevertebral fluid or swelling. No visible canal hematoma. Negative noncontrast neck soft tissues. Disc levels: Mild cervical spine degeneration. Foraminal disc and endplate spurring most pronounced at C3-C4. Upper chest: Visible upper thoracic levels and left lung apex appear negative. IMPRESSION: 1. 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 2. No associated hemorrhage and mild intracranial mass effect at this time, including trace leftward midline shift. 3. Evidence of large vessel occlusion: Hyperdensity of the right ICA terminus, the right A1 and MCA. 4. Unaffected brain parenchyma appears negative. 5.  No acute traumatic injury identified in the cervical spine. Critical Value/emergent results were  called by telephone at the time of interpretation on 07/19/2019 at 11:45 am to Dr. Virgel Manifold , who verbally acknowledged these results. Electronically Signed   By: Genevie Ann M.D.   On: 07/19/2019 11:46   Ct Cervical Spine Wo Contrast  Result Date: 07/19/2019 CLINICAL DATA:  43 year old male with neurologic deficit greater than 6 hours. Hemiparesis and aphasia. EXAM: CT HEAD WITHOUT CONTRAST CT CERVICAL SPINE WITHOUT CONTRAST TECHNIQUE: Multidetector CT imaging of the head and cervical spine was performed following the standard protocol without intravenous contrast. Multiplanar CT image reconstructions of the cervical spine were also generated. COMPARISON:  Report of neck CT 09/21/2014 (no images available). Report of head CT 07/23/2001 (no images available). FINDINGS: CT HEAD FINDINGS Brain: Confluent cytotoxic edema in the right hemisphere affecting the right ACA and right MCA territories. Mass effect on the right lateral ventricle and trace leftward midline shift (series 3, image 21). The right PCA territory appears mostly spared. There is right deep gray nuclei involvement. The left hemisphere and posterior fossa gray-white matter differentiation is preserved. No ventriculomegaly. Basilar cisterns remain patent. No acute intracranial hemorrhage identified. Vascular: Asymmetric hyperdensity at the right ICA terminus and involving the right A1, right M1 segments and also some right MCA branches (series 5, image 32). Skull: Negative. Sinuses/Orbits: Mild mucosal thickening in the left maxillary sinus. Other paranasal sinuses, tympanic cavities and mastoids are clear. Other: No acute orbit or scalp soft tissue finding. CT CERVICAL SPINE FINDINGS Alignment: Straightening of cervical lordosis. Cervicothoracic junction alignment is within normal limits. Bilateral posterior element alignment is within normal limits. Skull base and vertebrae: Visualized skull base is intact. No atlanto-occipital dissociation. No  acute osseous abnormality identified. Soft tissues and spinal canal: No prevertebral fluid or swelling. No visible canal hematoma. Negative noncontrast neck soft tissues. Disc levels: Mild cervical spine degeneration. Foraminal disc and endplate spurring most pronounced at C3-C4. Upper chest: Visible upper thoracic levels and left lung apex appear negative. IMPRESSION: 1. 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 2. No associated hemorrhage and mild intracranial mass effect at this time, including trace leftward midline shift. 3. Evidence of large vessel occlusion: Hyperdensity of the right ICA terminus, the right A1 and MCA. 4. Unaffected brain parenchyma appears negative. 5.  No acute traumatic injury identified in the cervical spine. Critical Value/emergent results were called by telephone at the time of interpretation on 07/19/2019 at 11:45 am to Dr. Virgel Manifold , who verbally acknowledged these results. Electronically Signed   By: Genevie Ann M.D.   On: 07/19/2019 11:46   Mr Angio Head Wo Contrast  Result Date: 07/19/2019 CLINICAL DATA:  43 year old male found down this morning by coworker. Large  right hemisphere infarcts in the MCA and ACA territories, and evidence of right ICA terminus occlusion on plain head CT today. EXAM: MRI HEAD WITHOUT CONTRAST MRA HEAD WITHOUT CONTRAST MRA NECK WITHOUT CONTRAST TECHNIQUE: Multiplanar, multiecho pulse sequences of the brain and surrounding structures were obtained without intravenous contrast. Angiographic images of the Circle of Willis were obtained using MRA technique without intravenous contrast. Angiographic images of the neck were obtained using MRA technique without intravenous contrast. Carotid stenosis measurements (when applicable) are obtained utilizing NASCET criteria, using the distal internal carotid diameter as the denominator. COMPARISON:  Plain head CT 1128 hours today. FINDINGS: MRI HEAD  FINDINGS Brain: Confluent restricted diffusion throughout the right hemisphere sparing most of the right PCA territory; there are few punctate areas of restricted diffusion in the right occipital pole (series 5, image 70). The right basal ganglia and internal capsule are affected. The right thalamus is spared. No left hemisphere or posterior fossa restricted diffusion. Associated T2 and FLAIR hyperintensity reflecting cytotoxic edema. No convincing hemorrhage; mild asymmetry of susceptibility weighted images felt related to some right hemisphere vascular engorgement. Trace leftward midline shift and mild mass effect on the right lateral ventricle. Incidental cavum septum pellucidum. Minimal mass effect on the suprasellar cistern. Other basilar cisterns are patent. No ventriculomegaly. Outside of the affected territory gray and white matter signal is normal. Negative pituitary. Vascular: The right ICA flow void is lost in the neck and through the siphon. The other Major intracranial vascular flow voids are preserved. See below. Skull and upper cervical spine: Negative visible cervical spine. Normal bone marrow signal. Sinuses/Orbits: Negative orbits. Trace paranasal sinus mucosal thickening. Other: Mastoids are clear. Scalp and face soft tissues appear negative. MRA NECK FINDINGS Time-of-flight images in the neck demonstrate antegrade flow signal in both common carotid arteries and both cervical vertebral arteries. From the level of the right carotid bifurcation there is absent flow signal in the right ICA. Preserved flow signal in the right ECA. The left carotid bifurcation and left ICA appear normal. Antegrade flow continues in the vertebral arteries to the skull base. MRA HEAD FINDINGS Antegrade flow in the posterior circulation with codominant distal vertebral arteries. Patent vertebrobasilar junction. Patent basilar artery. Motion artifact at the distal basilar which seems to remain patent. The basilar tip and  PCA origins appear to remain patent, but detail is obscured. Above the level of artifact there is flow signal detected in both PCAs, greater on the right (likely due to luxury perfusion on that side). At the skull base there is antegrade flow in the left ICA siphon, but no right ICA flow signal. Motion artifact beginning at the cavernous segment, but antegrade flow continues on the left to the level of the left ICA terminus. The left MCA and ACA origin are patent. Visible left MCA and ACA branches appear within normal limits. There is no flow signal at the right ICA terminus, right ACA or right MCA. IMPRESSION: 1. 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 The right ICA terminus, right ACA and MCA also appear occluded. 2. 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 Cytotoxic edema but no convincing hemorrhage. Stable mild mass effect with trace leftward midline shift. 3. Intracranial MRA is degraded by motion artifact, but no other large vessel occlusion is suspected. There appears to be lobes reperfusion of the right PCA. 4. The left hemisphere and posterior fossa brain parenchyma appears normal. Electronically Signed   By: Genevie Ann M.D.   On: 07/19/2019  14:00   Mr Angio Neck Wo Contrast  Result Date: 07/19/2019 CLINICAL DATA:  43 year old male found down this morning by coworker. 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 EXAM: MRI HEAD WITHOUT CONTRAST MRA HEAD WITHOUT CONTRAST MRA NECK WITHOUT CONTRAST TECHNIQUE: Multiplanar, multiecho pulse sequences of the brain and surrounding structures were obtained without intravenous contrast. Angiographic images of the Circle of Willis were obtained using MRA technique without intravenous contrast. Angiographic images of the neck were obtained using MRA technique without intravenous contrast. Carotid stenosis measurements (when applicable) are obtained  utilizing NASCET criteria, using the distal internal carotid diameter as the denominator. COMPARISON:  Plain head CT 1128 hours today. FINDINGS: MRI HEAD FINDINGS Brain: Confluent restricted diffusion throughout the right hemisphere sparing most of the right PCA territory; there are few punctate areas of restricted diffusion in the right occipital pole (series 5, image 70). The right basal ganglia and internal capsule are affected. The right thalamus is spared. No left hemisphere or posterior fossa restricted diffusion. Associated T2 and FLAIR hyperintensity reflecting cytotoxic edema. No convincing hemorrhage; mild asymmetry of susceptibility weighted images felt related to some right hemisphere vascular engorgement. Trace leftward midline shift and mild mass effect on the right lateral ventricle. Incidental cavum septum pellucidum. Minimal mass effect on the suprasellar cistern. Other basilar cisterns are patent. No ventriculomegaly. Outside of the affected territory gray and white matter signal is normal. Negative pituitary. Vascular: The right ICA flow void is lost in the neck and through the siphon. The other Major intracranial vascular flow voids are preserved. See below. Skull and upper cervical spine: Negative visible cervical spine. Normal bone marrow signal. Sinuses/Orbits: Negative orbits. Trace paranasal sinus mucosal thickening. Other: Mastoids are clear. Scalp and face soft tissues appear negative. MRA NECK FINDINGS Time-of-flight images in the neck demonstrate antegrade flow signal in both common carotid arteries and both cervical vertebral arteries. From the level of the right carotid bifurcation there is absent flow signal in the right ICA. Preserved flow signal in the right ECA. The left carotid bifurcation and left ICA appear normal. Antegrade flow continues in the vertebral arteries to the skull base. MRA HEAD FINDINGS Antegrade flow in the posterior circulation with codominant distal vertebral  arteries. Patent vertebrobasilar junction. Patent basilar artery. Motion artifact at the distal basilar which seems to remain patent. The basilar tip and PCA origins appear to remain patent, but detail is obscured. Above the level of artifact there is flow signal detected in both PCAs, greater on the right (likely due to luxury perfusion on that side). At the skull base there is antegrade flow in the left ICA siphon, but no right ICA flow signal. Motion artifact beginning at the cavernous segment, but antegrade flow continues on the left to the level of the left ICA terminus. The left MCA and ACA origin are patent. Visible left MCA and ACA branches appear within normal limits. There is no flow signal at the right ICA terminus, right ACA or right MCA. IMPRESSION: 1. 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 The right ICA terminus, right ACA and MCA also appear occluded. 2. 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 Cytotoxic edema but no convincing hemorrhage. Stable mild mass effect with trace leftward midline shift. 3. Intracranial MRA is degraded by motion artifact, but no other large vessel occlusion is suspected. There appears to be lobes reperfusion of the right PCA.  4. The left hemisphere and posterior fossa brain parenchyma appears normal. Electronically Signed   By: Genevie Ann M.D.   On: 07/19/2019 14:00   Mr Brain Wo Contrast  Result Date: 07/19/2019 CLINICAL DATA:  42 year old male found down this morning by coworker. 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 EXAM: MRI HEAD WITHOUT CONTRAST MRA HEAD WITHOUT CONTRAST MRA NECK WITHOUT CONTRAST TECHNIQUE: Multiplanar, multiecho pulse sequences of the brain and surrounding structures were obtained without intravenous contrast. Angiographic images of the Circle of Willis were obtained using MRA technique without intravenous contrast.  Angiographic images of the neck were obtained using MRA technique without intravenous contrast. Carotid stenosis measurements (when applicable) are obtained utilizing NASCET criteria, using the distal internal carotid diameter as the denominator. COMPARISON:  Plain head CT 1128 hours today. FINDINGS: MRI HEAD FINDINGS Brain: Confluent restricted diffusion throughout the right hemisphere sparing most of the right PCA territory; there are few punctate areas of restricted diffusion in the right occipital pole (series 5, image 70). The right basal ganglia and internal capsule are affected. The right thalamus is spared. No left hemisphere or posterior fossa restricted diffusion. Associated T2 and FLAIR hyperintensity reflecting cytotoxic edema. No convincing hemorrhage; mild asymmetry of susceptibility weighted images felt related to some right hemisphere vascular engorgement. Trace leftward midline shift and mild mass effect on the right lateral ventricle. Incidental cavum septum pellucidum. Minimal mass effect on the suprasellar cistern. Other basilar cisterns are patent. No ventriculomegaly. Outside of the affected territory gray and white matter signal is normal. Negative pituitary. Vascular: The right ICA flow void is lost in the neck and through the siphon. The other Major intracranial vascular flow voids are preserved. See below. Skull and upper cervical spine: Negative visible cervical spine. Normal bone marrow signal. Sinuses/Orbits: Negative orbits. Trace paranasal sinus mucosal thickening. Other: Mastoids are clear. Scalp and face soft tissues appear negative. MRA NECK FINDINGS Time-of-flight images in the neck demonstrate antegrade flow signal in both common carotid arteries and both cervical vertebral arteries. From the level of the right carotid bifurcation there is absent flow signal in the right ICA. Preserved flow signal in the right ECA. The left carotid bifurcation and left ICA appear normal. Antegrade  flow continues in the vertebral arteries to the skull base. MRA HEAD FINDINGS Antegrade flow in the posterior circulation with codominant distal vertebral arteries. Patent vertebrobasilar junction. Patent basilar artery. Motion artifact at the distal basilar which seems to remain patent. The basilar tip and PCA origins appear to remain patent, but detail is obscured. Above the level of artifact there is flow signal detected in both PCAs, greater on the right (likely due to luxury perfusion on that side). At the skull base there is antegrade flow in the left ICA siphon, but no right ICA flow signal. Motion artifact beginning at the cavernous segment, but antegrade flow continues on the left to the level of the left ICA terminus. The left MCA and ACA origin are patent. Visible left MCA and ACA branches appear within normal limits. There is no flow signal at the right ICA terminus, right ACA or right MCA. IMPRESSION: 1. 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 The right ICA terminus, right ACA and MCA also appear occluded. 2. 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 Cytotoxic edema but no convincing hemorrhage. Stable mild mass effect with trace leftward midline shift. 3. Intracranial MRA  is degraded by motion artifact, but no other large vessel occlusion is suspected. There appears to be lobes reperfusion of the right PCA. 4. The left hemisphere and posterior fossa brain parenchyma appears normal. Electronically Signed   By: Genevie Ann M.D.   On: 07/19/2019 14:00   Impression/Plan   43 y.o. male with large right hemispheric infarct with RICA, RACA and RMCA occlusions and associated cerebral edema. He is aphasic, but does follow commands with right side, no voluntary movement on the left. Dr Kathyrn Sheriff has reviewed the imaging and discussed the case with Neurology. Will proceed with right craniectomy for decompession. Neurology has discussed this with wife who is in agreement. Will  proceed on emergent basis.   Ferne Reus, PA-C [facility] [employer]

## 2019-07-19 NOTE — Anesthesia Procedure Notes (Signed)
Procedure Name: Intubation Date/Time: 07/19/2019 9:58 PM Performed by: Claris Che, CRNA Pre-anesthesia Checklist: Patient identified, Emergency Drugs available, Suction available, Patient being monitored and Timeout performed Patient Re-evaluated:Patient Re-evaluated prior to induction Oxygen Delivery Method: Circle system utilized Preoxygenation: Pre-oxygenation with 100% oxygen Induction Type: IV induction, Rapid sequence and Cricoid Pressure applied Laryngoscope Size: Mac and 3 Grade View: Grade II Tube type: Oral Tube size: 8.0 mm Number of attempts: 1 Airway Equipment and Method: Stylet Placement Confirmation: ETT inserted through vocal cords under direct vision,  positive ETCO2 and breath sounds checked- equal and bilateral Secured at: 24 cm Tube secured with: Tape Dental Injury: Teeth and Oropharynx as per pre-operative assessment

## 2019-07-19 NOTE — ED Triage Notes (Addendum)
Pt found by co worker , at work,  per ems no one had seen pt yesterday, pt found on floor had been incontinent , pt not moving left side at all but will move rt side  Has 18 rt ac pt not talking , will open eyes to pain, able to squeeze rt hand

## 2019-07-19 NOTE — ED Notes (Signed)
Patient transported to XR. 

## 2019-07-19 NOTE — ED Provider Notes (Signed)
[facility] Provider Note   CSN: [id number] Arrival date & time: 07/19/19  [id number]     History   Chief Complaint No chief complaint on file.   HPI Matthew Black is a 43 y.o. male.     HPI   35yM with AMS. Last seen two days ago. Today found altered by co-worker. Incontinent of urine. Not moving L side. Dried vomit on face. Not clear when exactly last known normal. CBG in 100s. For me pt will open eye to voice. Will follow commands with R side. Grimaces to pain on L side but no movement. Not speaking but will try to nod head yes/no. Eyes cross midline.    Subsequently able to obtain more information from wife. She last talked to him on the phone sometime before noon yesterday. He seemed to fine at that time. She tried calling him several times later in the afternoon and evening but he never answered. She was not to worried at that time though because sometimes if he gets busy with work then he won't call her back.  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 03/12/2016   Scalp abscess 12/20/2015   Neck abscess 12/20/2015   Pilonidal cyst 02/08/2013    Past Surgical History:  Procedure Laterality Date   CYST EXCISION N/A 10/08/2016   Procedure: EXCISION OF POSTERIOR NECK CYST;  Surgeon: Clovis Riley, MD;  Location: [facility];  Service: General;  Laterality: N/A;   INCISION AND DRAINAGE ABSCESS N/A 09/22/2014   Procedure: INCISION AND DRAINAGE ABSCESS POSTERIOR NECK;  Surgeon: Pedro Earls, MD;  Location: [facility];  Service: General;  Laterality: N/A;   INCISION AND DRAINAGE ABSCESS N/A 12/20/2015   Procedure: INCISION AND DRAINAGE POSTERIOR NECK MASS;  Surgeon: Armandina Gemma, MD;  Location: [facility];  Service: General;  Laterality: N/A;   INCISION AND DRAINAGE  ABSCESS Left 07/10/2004   middle finger   MASS EXCISION N/A 07/21/2017   Procedure: EXCISION OF BENIGN NECK LESION WITH LAYERED CLOSURE;  Surgeon: Irene Limbo, MD;  Location: [place];  Service: Plastics;  Laterality: N/A;   MASS EXCISION N/A 11/10/2017   Procedure: EXCISION BENIGN LESION OF THE NECK WITH LAYERED CLOSURE;  Surgeon: Irene Limbo, MD;  Location: [place];  Service: Plastics;  Laterality: N/A;      Home Medications    Prior to Admission medications   Medication Sig Start Date End Date Taking? Authorizing Provider  ibuprofen (ADVIL,MOTRIN) 800 MG tablet Take 1 tablet (800 mg total) by mouth 3 (three) times daily. 01/23/19   Raylene Everts, MD  oseltamivir (TAMIFLU) 75 MG capsule Take 1 capsule (75 mg total) by mouth every 12 (twelve) hours. 01/23/19   Raylene Everts, MD   Family History Family History  Problem Relation Age of Onset   Diabetes Other        GF   Healthy Mother    Healthy Father     Social History Social History   Tobacco Use   Smoking status: Former Smoker    Packs/day: 0.00    Quit date: 11/24/2015    Years since quitting: 3.6   Smokeless tobacco: Never Used   Tobacco comment:    Substance Use Topics   Alcohol use: Yes  Comment: occasionally   Drug use: Never    Allergies   Hydrocodone   Review of Systems Review of Systems  Level 5 caveat because pt is aphasic.   Physical Exam Updated Vital Signs There were no vitals taken for this visit.  Physical Exam Vitals signs and nursing note reviewed.  Constitutional:      Appearance: He is well-developed. He is ill-appearing.  HENT:     Head: Normocephalic and atraumatic.  Eyes:     General:        Right eye: No discharge.        Left eye: No discharge.     Conjunctiva/sclera: Conjunctivae normal.     Pupils: Pupils are equal, round, and reactive to light.  Neck:     Musculoskeletal: Neck supple.  Cardiovascular:      Rate and Rhythm: Normal rate and regular rhythm.     Heart sounds: Normal heart sounds. No murmur. No friction rub. No gallop.   Pulmonary:     Effort: Pulmonary effort is normal. No respiratory distress.     Breath sounds: Normal breath sounds.  Abdominal:     General: There is no distension.     Palpations: Abdomen is soft.     Tenderness: There is no abdominal tenderness.  Musculoskeletal:        General: No tenderness.  Skin:    General: Skin is warm and dry.  Neurological:     Mental Status: He is alert.     Comments: Nonverbal. Will try to no head yes/no to questions. Follows commands on R side. Grimaces to pain on L but no movement. Eyes pass midline. c-collar in place. Face seems symmetric.       ED Treatments / Results  Labs (all labs ordered are listed, but only abnormal results are displayed) Labs Reviewed  CBC WITH DIFFERENTIAL/PLATELET - Abnormal; Notable for the following components:      Result Value   WBC 16.7 (*)    RBC 5.88 (*)    Neutro Abs 14.2 (*)    Monocytes Absolute 1.2 (*)    All other components within normal limits  CBG MONITORING, ED - Abnormal; Notable for the following components:   Glucose-Capillary 123 (*)    All other components within normal limits  SARS CORONAVIRUS 2 (TAT 6-12 HRS)  ETHANOL  COMPREHENSIVE METABOLIC PANEL  URINALYSIS, ROUTINE W REFLEX MICROSCOPIC  RAPID URINE DRUG SCREEN, HOSP PERFORMED    EKG EKG Interpretation  Date/Time:  Tuesday July 19 2019 09:45:45 EDT Ventricular Rate:  57 PR Interval:    QRS Duration: 102 QT Interval:  424 QTC Calculation: 413 R Axis:   76 Text Interpretation:  Sinus rhythm ST elev, probable normal early repol pattern Confirmed by Virgel Manifold [phone]) on 07/19/2019 10:08:42 AM   Radiology Ct Head Wo Contrast  Result Date: 07/19/2019 CLINICAL DATA:  43 year old male with neurologic deficit greater than 6 hours. Hemiparesis and aphasia. EXAM: CT HEAD WITHOUT CONTRAST CT CERVICAL SPINE  WITHOUT CONTRAST TECHNIQUE: Multidetector CT imaging of the head and cervical spine was performed following the standard protocol without intravenous contrast. Multiplanar CT image reconstructions of the cervical spine were also generated. COMPARISON:  Report of neck CT 09/21/2014 (no images available). Report of head CT 07/23/2001 (no images available). FINDINGS: CT HEAD FINDINGS Brain: Confluent cytotoxic edema in the right hemisphere affecting the right ACA and right MCA territories. Mass effect on the right lateral ventricle and trace leftward midline shift (series 3, image 21). The  right PCA territory appears mostly spared. There is right deep gray nuclei involvement. The left hemisphere and posterior fossa gray-white matter differentiation is preserved. No ventriculomegaly. Basilar cisterns remain patent. No acute intracranial hemorrhage identified. Vascular: Asymmetric hyperdensity at the right ICA terminus and involving the right A1, right M1 segments and also some right MCA branches (series 5, image 32). Skull: Negative. Sinuses/Orbits: Mild mucosal thickening in the left maxillary sinus. Other paranasal sinuses, tympanic cavities and mastoids are clear. Other: No acute orbit or scalp soft tissue finding. CT CERVICAL SPINE FINDINGS Alignment: Straightening of cervical lordosis. Cervicothoracic junction alignment is within normal limits. Bilateral posterior element alignment is within normal limits. Skull base and vertebrae: Visualized skull base is intact. No atlanto-occipital dissociation. No acute osseous abnormality identified. Soft tissues and spinal canal: No prevertebral fluid or swelling. No visible canal hematoma. Negative noncontrast neck soft tissues. Disc levels: Mild cervical spine degeneration. Foraminal disc and endplate spurring most pronounced at C3-C4. Upper chest: Visible upper thoracic levels and left lung apex appear negative. IMPRESSION: 1. Large right hemisphere infarct with confluent  cytotoxic edema in the right ACA and MCA territories. 2. No associated hemorrhage and mild intracranial mass effect at this time, including trace leftward midline shift. 3. Evidence of large vessel occlusion: Hyperdensity of the right ICA terminus, the right A1 and MCA. 4. Unaffected brain parenchyma appears negative. 5.  No acute traumatic injury identified in the cervical spine. Critical Value/emergent results were called by telephone at the time of interpretation on 07/19/2019 at 11:45 am to Dr. Virgel Manifold , who verbally acknowledged these results. Electronically Signed   By: Genevie Ann M.D.   On: 07/19/2019 11:46   Ct Cervical Spine Wo Contrast  Result Date: 07/19/2019 CLINICAL DATA:  43 year old male with neurologic deficit greater than 6 hours. Hemiparesis and aphasia. EXAM: CT HEAD WITHOUT CONTRAST CT CERVICAL SPINE WITHOUT CONTRAST TECHNIQUE: Multidetector CT imaging of the head and cervical spine was performed following the standard protocol without intravenous contrast. Multiplanar CT image reconstructions of the cervical spine were also generated. COMPARISON:  Report of neck CT 09/21/2014 (no images available). Report of head CT 07/23/2001 (no images available). FINDINGS: CT HEAD FINDINGS Brain: Confluent cytotoxic edema in the right hemisphere affecting the right ACA and right MCA territories. Mass effect on the right lateral ventricle and trace leftward midline shift (series 3, image 21). The right PCA territory appears mostly spared. There is right deep gray nuclei involvement. The left hemisphere and posterior fossa gray-white matter differentiation is preserved. No ventriculomegaly. Basilar cisterns remain patent. No acute intracranial hemorrhage identified. Vascular: Asymmetric hyperdensity at the right ICA terminus and involving the right A1, right M1 segments and also some right MCA branches (series 5, image 32). Skull: Negative. Sinuses/Orbits: Mild mucosal thickening in the left maxillary  sinus. Other paranasal sinuses, tympanic cavities and mastoids are clear. Other: No acute orbit or scalp soft tissue finding. CT CERVICAL SPINE FINDINGS Alignment: Straightening of cervical lordosis. Cervicothoracic junction alignment is within normal limits. Bilateral posterior element alignment is within normal limits. Skull base and vertebrae: Visualized skull base is intact. No atlanto-occipital dissociation. No acute osseous abnormality identified. Soft tissues and spinal canal: No prevertebral fluid or swelling. No visible canal hematoma. Negative noncontrast neck soft tissues. Disc levels: Mild cervical spine degeneration. Foraminal disc and endplate spurring most pronounced at C3-C4. Upper chest: Visible upper thoracic levels and left lung apex appear negative. IMPRESSION: 1. Large right hemisphere infarct with confluent cytotoxic edema in the right ACA  and MCA territories. 2. No associated hemorrhage and mild intracranial mass effect at this time, including trace leftward midline shift. 3. Evidence of large vessel occlusion: Hyperdensity of the right ICA terminus, the right A1 and MCA. 4. Unaffected brain parenchyma appears negative. 5.  No acute traumatic injury identified in the cervical spine. Critical Value/emergent results were called by telephone at the time of interpretation on 07/19/2019 at 11:45 am to Dr. Virgel Manifold , who verbally acknowledged these results. Electronically Signed   By: Genevie Ann M.D.   On: 07/19/2019 11:46    Procedures Procedures (including critical care time)  CRITICAL CARE Performed by: Virgel Manifold Total critical care time: 40 minutes Critical care time was exclusive of separately billable procedures and treating other patients. Critical care was necessary to treat or prevent imminent or life-threatening deterioration. Critical care was time spent personally by me on the following activities: development of treatment plan with patient and/or surrogate as well as  nursing, discussions with consultants, evaluation of patient's response to treatment, examination of patient, obtaining history from patient or surrogate, ordering and performing treatments and interventions, ordering and review of laboratory studies, ordering and review of radiographic studies, pulse oximetry and re-evaluation of patient's condition.   Medications Ordered in ED Medications - No data to display   Initial Impression / Assessment and Plan / ED Course  I have reviewed the triage vital signs and the nursing notes.  Pertinent labs & imaging results that were available during my care of the patient were reviewed by me and considered in my medical decision making (see chart for details).     43yM with AMS.  L hemiparesis and aphasia. Initial history that last known normal well beyond 24 hours ago. Subsequently able to obtain additional information from wife an hour after he presented to the ER. She says last known normal around 1200 yesterday when she spoke to him. That would make him barely within 24 hour window for possible LVO intervention. Briefly discussed with neurology with regards to this. Currently we do not have the available resources at this facility for intervention as there is a patient on the table with neuro interventionalist. We would not be able to obtain necessary imaging and transfer to next closest facility within an hour.   Unfortunately, imaging as above. Neurology to see. Medicine admission. Wife at bedside and updated.   Final Clinical Impressions(s) / ED Diagnoses   Final diagnoses:  Acute CVA (cerebrovascular accident) [facility])    ED Discharge Orders    None       Virgel Manifold, MD 07/19/19 1319

## 2019-07-19 NOTE — Transfer of Care (Signed)
Immediate Anesthesia Transfer of Care Note  Patient: Matthew Black  Procedure(s) Performed: RIGHT HEMI-CRANIECTOMY With implantation of skull flap to abdominal wall (Right Head)  Patient Location: ICU  Anesthesia Type:General  Level of Consciousness: sedated and Patient remains intubated per anesthesia plan  Airway & Oxygen Therapy: Patient remains intubated per anesthesia plan and Patient placed on Ventilator (see vital sign flow sheet for setting)  Post-op Assessment: Report given to RN  Post vital signs: Reviewed and stable  Last Vitals:  Vitals Value Taken Time  BP 99/54 07/19/19 2355  Temp 37 C 07/19/19 2355  Pulse 76 07/19/19 2357  Resp 15 07/19/19 2357  SpO2 100 % 07/19/19 2357  Vitals shown include unvalidated device data.  Last Pain:  Vitals:   07/19/19 2355  TempSrc: Oral  PainSc:          Complications: No apparent anesthesia complications

## 2019-07-19 NOTE — Telephone Encounter (Signed)
Talked to patient's mother and she wanted Dr. Larose Kells to be aware patient had a CVA this morning and He is at [facility].

## 2019-07-19 NOTE — H&P (Addendum)
Requesting Physician: Dr. Wilson Singer     Chief Complaint: Acute CVA   History obtained from: Patient's Wife and Chart    HPI:                                                                                                                                       Matthew Black is a 43 y.o. male with no past medical history who presented to the ED after being found down at home this morning. Patient is somnolent and nonverbal, he is unable to provide any history at this time. Wife present in the room who provided some history. Wife last saw patient yesterday morning before he left for work. This was around 8:30AM.  He appeared to be in his usual state of health at that time.  She then spoke to him briefly over the phone before noon yesterday.  He was taking an online class at that time and he appeared to be at his baseline.  She did not hear back from him the rest of the day. He did not get home last night, which is not unusual for him when he has busy days at work. This morning at 9 AM he was found down on the floor next to his desk by one of his coworkers. He had dry vomit around his mouth and was covered in urine. He was nonverbal and was not moving the left side of his body. He does not take any medications at home and does not use illicit drugs per wife. UDS positive for THC. He was last seen by his PCP in [facility] in 2019 (no records in chart) and was told he was healthy.   ED course: Nonverbal on arrival. Squeezing R hand. CT head showed large R hemisphere infarct with confluent cytotoxic edema in R ACA and MCA territories, and mild intracranial mass effect. No hemorrhage. MRA showed complete occlusion of R ICA and R ACA/MCA occlusion.   Date last known well: 07/18/2019 Time last known well: 12PM tPA Given: No, out of window  NIHSS: 13 Baseline MRS 0   Past Medical History:  Diagnosis Date  . 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 CYST EXCISION  N/A 10/08/2016   Procedure: EXCISION OF POSTERIOR NECK CYST;  Surgeon: Clovis Riley, MD;  Location: [facility];  Service: General;  Laterality: N/A;  . INCISION AND DRAINAGE ABSCESS N/A 09/22/2014   Procedure: INCISION AND DRAINAGE ABSCESS POSTERIOR NECK;  Surgeon: Pedro Earls, MD;  Location: [facility];  Service: General;  Laterality: N/A;  . INCISION AND DRAINAGE ABSCESS N/A 12/20/2015   Procedure: INCISION AND DRAINAGE POSTERIOR NECK MASS;  Surgeon: Armandina Gemma, MD;  Location: [facility];  Service: General;  Laterality: N/A;  . INCISION AND DRAINAGE ABSCESS Left 07/10/2004   middle finger  . MASS EXCISION N/A 07/21/2017   Procedure:  EXCISION OF BENIGN NECK LESION WITH LAYERED CLOSURE;  Surgeon: Irene Limbo, MD;  Location: [place];  Service: Plastics;  Laterality: N/A;  . MASS EXCISION N/A 11/10/2017   Procedure: EXCISION BENIGN LESION OF THE NECK WITH LAYERED CLOSURE;  Surgeon: Irene Limbo, MD;  Location: [place];  Service: Plastics;  Laterality: N/A;    Family History  Problem Relation Age of Onset  . Diabetes Other        GF  . Healthy Mother   . Healthy Father    Social History:  reports that he quit smoking about 3 years ago. He smoked 0.00 packs per day. He has never used smokeless tobacco. He reports current alcohol use. He reports that he does not use drugs.  Allergies:  Allergies  Allergen Reactions  . Hydrocodone Nausea Only    Medications:                                                                                                                        Ibuprofen Tamiflu    ROS:                                                                                                                                     Unable to obtain due to aphasia.    Examination:                                                                                                      General: Appears well-developed and well nourished.  Psych:  Somnolent.  Eyes: No scleral injection HENT: No OP obstrucion Head: Normocephalic.  Cardiovascular: Normal rate and regular rhythm.  Respiratory: Effort normal and breath sounds normal to anterior ascultation GI: Soft.  No distension. There is no tenderness.  Skin: WDI    Neurological Examination Mental Status: Somnolent after receiving Ativan for MRI. Does not arouse to verbal or sternal stimuli. Nonverbal, does not open eyes to command but does squeeze with R  hand.  Cranial Nerves: II: Unable to test.  III,IV, VI: Does not open eyes to commands. When eyes are opened for him he is able to track horizontally, not vertically. Pupils equal, round, reactive to light  V,VII: no facial droop noted VIII: hearing cannot be formally assessed XI: R shoulder shrug present, none present on the L  Motor: some muscle twitching observed of L quadriceps. Also appears somewhat restless, frequently moving R-sided extremities.  Right : Upper extremity   4/5    Left:     Upper extremity   0/5  Lower extremity   2/5     Lower extremity   0/5 Tone and bulk:normal tone throughout; no atrophy noted Sensory: Withdraws to pain  Deep Tendon Reflexes: Areflexic on LUE and LLE, difficult to elicit reflexes on RUE and RLE as he contracts/stiffens them frequently during exam  Plantars: Right: downgoing   Left: downgoing Cerebellar: Unable to test due to AMS  Gait: Unable to assess     Lab Results: Basic Metabolic Panel: No results for input(s): NA, K, CL, CO2, GLUCOSE, BUN, CREATININE, CALCIUM, MG, PHOS in the last 168 hours.  CBC: Recent Labs  Lab 07/19/19 1040  WBC 16.7*  NEUTROABS 14.2*  HGB 16.8  HCT 50.2  MCV 85.4  PLT 275    Coagulation Studies: No results for input(s): LABPROT, INR in the last 72 hours.  Imaging: Ct Head Wo Contrast  Result Date: 07/19/2019 CLINICAL DATA:  43 year old male with neurologic deficit greater than 6 hours. Hemiparesis and aphasia. EXAM: CT HEAD WITHOUT  CONTRAST CT CERVICAL SPINE WITHOUT CONTRAST TECHNIQUE: Multidetector CT imaging of the head and cervical spine was performed following the standard protocol without intravenous contrast. Multiplanar CT image reconstructions of the cervical spine were also generated. COMPARISON:  Report of neck CT 09/21/2014 (no images available). Report of head CT 07/23/2001 (no images available). FINDINGS: CT HEAD FINDINGS Brain: Confluent cytotoxic edema in the right hemisphere affecting the right ACA and right MCA territories. Mass effect on the right lateral ventricle and trace leftward midline shift (series 3, image 21). The right PCA territory appears mostly spared. There is right deep gray nuclei involvement. The left hemisphere and posterior fossa gray-white matter differentiation is preserved. No ventriculomegaly. Basilar cisterns remain patent. No acute intracranial hemorrhage identified. Vascular: Asymmetric hyperdensity at the right ICA terminus and involving the right A1, right M1 segments and also some right MCA branches (series 5, image 32). Skull: Negative. Sinuses/Orbits: Mild mucosal thickening in the left maxillary sinus. Other paranasal sinuses, tympanic cavities and mastoids are clear. Other: No acute orbit or scalp soft tissue finding. CT CERVICAL SPINE FINDINGS Alignment: Straightening of cervical lordosis. Cervicothoracic junction alignment is within normal limits. Bilateral posterior element alignment is within normal limits. Skull base and vertebrae: Visualized skull base is intact. No atlanto-occipital dissociation. No acute osseous abnormality identified. Soft tissues and spinal canal: No prevertebral fluid or swelling. No visible canal hematoma. Negative noncontrast neck soft tissues. Disc levels: Mild cervical spine degeneration. Foraminal disc and endplate spurring most pronounced at C3-C4. Upper chest: Visible upper thoracic levels and left lung apex appear negative. IMPRESSION: 1. Large right  hemisphere infarct with confluent cytotoxic edema in the right ACA and MCA territories. 2. No associated hemorrhage and mild intracranial mass effect at this time, including trace leftward midline shift. 3. Evidence of large vessel occlusion: Hyperdensity of the right ICA terminus, the right A1 and MCA. 4. Unaffected brain parenchyma appears negative. 5.  No acute traumatic injury  identified in the cervical spine. Critical Value/emergent results were called by telephone at the time of interpretation on 07/19/2019 at 11:45 am to Dr. Virgel Manifold , who verbally acknowledged these results. Electronically Signed   By: Genevie Ann M.D.   On: 07/19/2019 11:46   Ct Cervical Spine Wo Contrast  Result Date: 07/19/2019 CLINICAL DATA:  43 year old male with neurologic deficit greater than 6 hours. Hemiparesis and aphasia. EXAM: CT HEAD WITHOUT CONTRAST CT CERVICAL SPINE WITHOUT CONTRAST TECHNIQUE: Multidetector CT imaging of the head and cervical spine was performed following the standard protocol without intravenous contrast. Multiplanar CT image reconstructions of the cervical spine were also generated. COMPARISON:  Report of neck CT 09/21/2014 (no images available). Report of head CT 07/23/2001 (no images available). FINDINGS: CT HEAD FINDINGS Brain: Confluent cytotoxic edema in the right hemisphere affecting the right ACA and right MCA territories. Mass effect on the right lateral ventricle and trace leftward midline shift (series 3, image 21). The right PCA territory appears mostly spared. There is right deep gray nuclei involvement. The left hemisphere and posterior fossa gray-white matter differentiation is preserved. No ventriculomegaly. Basilar cisterns remain patent. No acute intracranial hemorrhage identified. Vascular: Asymmetric hyperdensity at the right ICA terminus and involving the right A1, right M1 segments and also some right MCA branches (series 5, image 32). Skull: Negative. Sinuses/Orbits: Mild mucosal  thickening in the left maxillary sinus. Other paranasal sinuses, tympanic cavities and mastoids are clear. Other: No acute orbit or scalp soft tissue finding. CT CERVICAL SPINE FINDINGS Alignment: Straightening of cervical lordosis. Cervicothoracic junction alignment is within normal limits. Bilateral posterior element alignment is within normal limits. Skull base and vertebrae: Visualized skull base is intact. No atlanto-occipital dissociation. No acute osseous abnormality identified. Soft tissues and spinal canal: No prevertebral fluid or swelling. No visible canal hematoma. Negative noncontrast neck soft tissues. Disc levels: Mild cervical spine degeneration. Foraminal disc and endplate spurring most pronounced at C3-C4. Upper chest: Visible upper thoracic levels and left lung apex appear negative. IMPRESSION: 1. 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 2. No associated hemorrhage and mild intracranial mass effect at this time, including trace leftward midline shift. 3. Evidence of large vessel occlusion: Hyperdensity of the right ICA terminus, the right A1 and MCA. 4. Unaffected brain parenchyma appears negative. 5.  No acute traumatic injury identified in the cervical spine. Critical Value/emergent results were called by telephone at the time of interpretation on 07/19/2019 at 11:45 am to Dr. Virgel Manifold , who verbally acknowledged these results. Electronically Signed   By: Genevie Ann M.D.   On: 07/19/2019 11:46     ASSESSMENT  43 y.o. male with no past medical history who presented to the ED after being found down at home this morning. CT revealed a large R hemisphere infarct with R ICA, R ACA, and R MCA occlusions. Patient somnolent on initial neurological exam after receiving Ativan for MRI and unable to test for aphasia and sensation. Left sided deficits are present on exam. NIHSS is 13.   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  -  Risk factors: none  - Etiology: R ICA occlusion   Recommendations:  # Admitting to the ICU under the Neurology service # Repeat head CT in 12 hours (11:30 PM) # Hypertonic saline for cerebral edema at 50 cc per hour # TTE  # Start patient on ASA 325 mg daily # Start or continue Atorvastatin 80 mg/other  high intensity statin # BP goal: Normotensive given large completed infarction which is unlikely to have a significant ischemic penumbra and has a high risk of hemorrhagic conversion # HBAIC and Lipid profile # Telemetry monitoring # Frequent neuro checks # NPO until passes stroke swallow screen # Seizure precautions  # DVT prophylaxis with SCDs given high risk of hemorrhagic conversion of the stroke   Welford Roche, MD  Internal Medicine PGY-3  P [phone]  45 minutes spent in the neurological evaluation and management of this critically ill patient.   I have seen and examined the patient. I have formulated the assessment and plan.  Electronically signed: Dr. Kerney Elbe

## 2019-07-19 NOTE — Consult Note (Addendum)
NAME:  Matthew Black, MRN:  [id number], DOB:  08/20/1976, LOS: 0 ADMISSION DATE:  07/19/2019, CONSULTATION DATE:  8/25 REFERRING MD:  EDP, CHIEF COMPLAINT:  Stroke   Brief History   43yo male with no known PMH presented 8/25 after being found down at work at [employer] by a coworker covered in vomit and urine.  Last seen normal by his wife 830am 8/24 before he left for work.  CT head showed large R hemisphere infarct with edema and mild mass effect. He was admitted by neurology, placed on 3% saline and PCCM asked to consult for ICU assistance.   History of present illness   43yo male with no known PMH presented 8/25 after being found down at work at [employer] by a coworker covered in vomit and urine.  Last seen normal by his wife 830am 8/24 before he left for work.  CT head showed large R hemisphere infarct with edema and mild mass effect.  He was admitted by neurology, placed on 3% saline and PCCM asked to consult for ICU assistance.   Past Medical History  None.  Significant Hospital Events   8/25 > admit.  Consults:  PCCM. Neurology.  Procedures:  None.  Significant Diagnostic Tests:  MR brain 8/25>>> The right ICA is occluded from its origin. The right ICA terminus, right ACA and MCA also appear occluded.  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 Cytotoxic edema but no convincing hemorrhage. Stable mild mass effect with trace leftward midline shift.  CT head 8/25 >  Echo 8/25 >   Micro Data:  COVID 8/25 >    Antimicrobials:  None.  Interim history/subjective:  Vitals stable. Labs pending.  Objective   Blood pressure 126/83, pulse 74, temperature (!) 102.6 F (39.2 C), temperature source Rectal, resp. rate (!) 31, SpO2 96 %.       No intake or output data in the 24 hours ending 07/19/19 1505 There were no vitals filed for this visit.  Examination: General: Adult male, resting in bed, in NAD. Neuro: Left sided hemiparesis. HEENT: [place]/AT. Sclerae  anicteric. EOMI. Cardiovascular: RRR, no M/R/G.  Lungs: Respirations even and unlabored.  CTA bilaterally, No W/R/R. Abdomen: BS x 4, soft, NT/ND.  Musculoskeletal: No gross deformities, no edema.  Skin: Intact, warm, no rashes.    Assessment & Plan:   Large right hemispheric infarct - unclear etiology at this point.  Some consideration must be given for COVID infarct given young pt with no underlying medical history. - Stroke workup per neuro. - 3% NS per neuro. - F/u echo, repeat CT.  R/o COVID. - F/u COVID testing. - Add inflammatory markers.  At risk rhabdo - unknown downtime, last seen normal over 24 hours prior to ED presentation. - F/u CK, CMP.  Substance abuse - UDS positive for THC. - Substance abuse counseling.   Rest per primary team.  Best practice:  Diet: NPO. Pain/Anxiety/Delirium protocol (if indicated): N/A. VAP protocol (if indicated): N/A. DVT prophylaxis: Per primary. GI prophylaxis: N/A. Glucose control: SSI if glucose consistently > 180. Mobility: Bedrest. Code Status: Full. Family Communication: Wife. Disposition:  ICU.  Labs   CBC: Recent Labs  Lab 07/19/19 1040  WBC 16.7*  NEUTROABS 14.2*  HGB 16.8  HCT 50.2  MCV 85.4  PLT 123XX123    Basic Metabolic Panel: No results for input(s): NA, K, CL, CO2, GLUCOSE, BUN, CREATININE, CALCIUM, MG, PHOS in the last 168 hours. GFR: CrCl cannot be calculated (Patient's most  recent lab result is older than the maximum 21 days allowed.). Recent Labs  Lab 07/19/19 1040  WBC 16.7*    Liver Function Tests: No results for input(s): AST, ALT, ALKPHOS, BILITOT, PROT, ALBUMIN in the last 168 hours. No results for input(s): LIPASE, AMYLASE in the last 168 hours. No results for input(s): AMMONIA in the last 168 hours.  ABG No results found for: PHART, PCO2ART, PO2ART, HCO3, TCO2, ACIDBASEDEF, O2SAT   Coagulation Profile: No results for input(s): INR, PROTIME in the last 168 hours.  Cardiac Enzymes:  No results for input(s): CKTOTAL, CKMB, CKMBINDEX, TROPONINI in the last 168 hours.  HbA1C: Hgb A1c MFr Bld  Date/Time Value Ref Range Status  09/11/2016 10:33 AM 5.6 4.6 - 6.5 % Final    Comment:    Glycemic Control Guidelines for People with Diabetes:Non Diabetic:  <6%Goal of Therapy: <7%Additional Action Suggested:  >8%     CBG: Recent Labs  Lab 07/19/19 0951  GLUCAP 123*    Review of Systems:   Unable to obtain as pt is altered.  Past Medical History  He,  has a past medical history of Acne keloidalis nuchae (10/2017).   Surgical History    Past Surgical History:  Procedure Laterality Date  . CYST EXCISION N/A 10/08/2016   Procedure: EXCISION OF POSTERIOR NECK CYST;  Surgeon: Clovis Riley, MD;  Location[facility];  Service: General;  Laterality: N/A;  . INCISION AND DRAINAGE ABSCESS N/A 09/22/2014   Procedure: INCISION AND DRAINAGE ABSCESS POSTERIOR NECK;  Surgeon: Pedro Earls, MD;  [facility];  Service: General;  Laterality: N/A;  . INCISION AND DRAINAGE ABSCESS N/A 12/20/2015   Procedure: INCISION AND DRAINAGE POSTERIOR NECK MASS;  Surgeon: Armandina Gemma, MD;  [facility];  Service: General;  Laterality: N/A;  . INCISION AND DRAINAGE ABSCESS Left 07/10/2004   middle finger  . MASS EXCISION N/A 07/21/2017   Procedure: EXCISION OF BENIGN NECK LESION WITH LAYERED CLOSURE;  Surgeon: Irene Limbo, MD;  Location: [place];  Service: Plastics;  Laterality: N/A;  . MASS EXCISION N/A 11/10/2017   Procedure: EXCISION BENIGN LESION OF THE NECK WITH LAYERED CLOSURE;  Surgeon: Irene Limbo, MD;  Location: [place];  Service: Plastics;  Laterality: N/A;     Social History   reports that he quit smoking about 3 years ago. He smoked 0.00 packs per day. He has never used smokeless tobacco. He reports current alcohol use. He reports that he does not use drugs.   Family History   His family history includes Diabetes in an  other family member; Healthy in his father and mother.   Allergies Allergies  Allergen Reactions  . Hydrocodone Nausea Only     Home Medications  Prior to Admission medications   Medication Sig Start Date End Date Taking? Authorizing Provider  ibuprofen (ADVIL,MOTRIN) 800 MG tablet Take 1 tablet (800 mg total) by mouth 3 (three) times daily. Patient not taking: Reported on 07/19/2019 01/23/19   Raylene Everts, MD  oseltamivir (TAMIFLU) 75 MG capsule Take 1 capsule (75 mg total) by mouth every 12 (twelve) hours. Patient not taking: Reported on 07/19/2019 01/23/19   Raylene Everts, MD     Critical care time: 35 min.    Montey Hora, [facility] Pulmonary & [facility] Care Medicine Pager: [phone].  If no answer, [phone] [phone] 07/19/2019, 3:45 PM  Attending Note:  43 year old male with no known  PMH who presents to the hospital after being found down at work after being found at 9 AM by coworkers and last seen normal at 8:30 AM on 8/24 prior to going to work.  CT of the head was done that showed a right large hemispheric infarct with edema and mild mass effect.  Patient was placed on 3% and PCCM was consulted to assess for ICU.  Patient is protecting his airway on exam but moving only his right side with clear lungs.  I reviewed head CT myself, large right sided infarct noted with mass effect.  Discussed with PCCM-NP.  Neurology to admit and manage 3% and BP.  PCCM will consult for airway management.  No need for intubation right now but anticipate with such a large infarct that patient will likely fail his airway protection but for now will monitor closely in the ICU.  Neurosurgery consult per neurology's digression.  Maintain NPO and avoid all sedation.  As long as 3% is under 75 ml/hr then will continue via peripheral IV.  If more is needed then will place TLC.  Given fever, will pan culture and check PCT as well as a CXR but hold off abx for now unless infiltrate in CXR.  Check  COVID now.  PCCM will continue to follow.  The patient is critically ill with multiple organ systems failure and requires high complexity decision making for assessment and support, frequent evaluation and titration of therapies, application of advanced monitoring technologies and extensive interpretation of multiple databases.   Critical Care Time devoted to patient care services described in this note is  45  Minutes. This time reflects time of care of this signee Dr Jennet Maduro. This critical care time does not reflect procedure time, or teaching time or supervisory time of PA/NP/Med student/Med Resident etc but could involve care discussion time.  Rush Farmer, M.D. [facility] Pulmonary/Critical Care Medicine. Pager: [phone]. After hours pager: [phone].

## 2019-07-19 NOTE — ED Notes (Signed)
Per Cheral Marker, MD, monitor for: elevated BP change in pupils decreased movement to the right side change in mental status Irregular respirations/change in respiratory status

## 2019-07-19 NOTE — Anesthesia Preprocedure Evaluation (Signed)
Anesthesia Evaluation  Patient identified by MRN, date of birth, ID band Patient awake    Reviewed: Allergy & Precautions, NPO status , Patient's Chart, lab work & pertinent test results  Airway Mallampati: II  TM Distance: >3 FB Neck ROM: Full    Dental   Pulmonary former smoker,    Pulmonary exam normal        Cardiovascular hypertension, Pt. on medications Normal cardiovascular exam     Neuro/Psych CVA    GI/Hepatic   Endo/Other    Renal/GU      Musculoskeletal   Abdominal   Peds  Hematology   Anesthesia Other Findings   Reproductive/Obstetrics                             Anesthesia Physical Anesthesia Plan  ASA: III and emergent  Anesthesia Plan: General   Post-op Pain Management:    Induction: Intravenous  PONV Risk Score and Plan: 2 and Ondansetron and Treatment may vary due to age or medical condition  Airway Management Planned: Oral ETT  Additional Equipment: Arterial line  Intra-op Plan:   Post-operative Plan: Post-operative intubation/ventilation  Informed Consent: I have reviewed the patients History and Physical, chart, labs and discussed the procedure including the risks, benefits and alternatives for the proposed anesthesia with the patient or authorized representative who has indicated his/her understanding and acceptance.       Plan Discussed with: Surgeon and CRNA  Anesthesia Plan Comments:         Anesthesia Quick Evaluation

## 2019-07-19 NOTE — ED Notes (Signed)
Lindzen, MD made aware of pt increased rectal temp of 103.12F. Given verbal order for cooling blanket.

## 2019-07-19 NOTE — Anesthesia Procedure Notes (Signed)
Arterial Line Insertion Start/End8/25/2020 10:22 PM, 07/19/2019 10:24 PM Performed by: Valetta Fuller, CRNA, CRNA  Patient location: OR. Preanesthetic checklist: patient identified and IV checked Patient sedated Left, radial was placed Catheter size: 20 G Hand hygiene performed  and maximum sterile barriers used   Attempts: 2 Procedure performed without using ultrasound guided technique. Following insertion, dressing applied and Biopatch. Post procedure assessment: normal and unchanged

## 2019-07-19 NOTE — ED Notes (Signed)
Pt cleaned and placed in gown condom cath placed with drainage bag

## 2019-07-20 ENCOUNTER — Inpatient Hospital Stay (HOSPITAL_COMMUNITY): Payer: Medicaid Other

## 2019-07-20 ENCOUNTER — Inpatient Hospital Stay: Payer: Self-pay

## 2019-07-20 ENCOUNTER — Encounter (HOSPITAL_COMMUNITY): Payer: Self-pay | Admitting: Neurosurgery

## 2019-07-20 DIAGNOSIS — I63231 Cerebral infarction due to unspecified occlusion or stenosis of right carotid arteries: Secondary | ICD-10-CM

## 2019-07-20 DIAGNOSIS — F1210 Cannabis abuse, uncomplicated: Secondary | ICD-10-CM

## 2019-07-20 DIAGNOSIS — G936 Cerebral edema: Secondary | ICD-10-CM

## 2019-07-20 DIAGNOSIS — J9601 Acute respiratory failure with hypoxia: Secondary | ICD-10-CM

## 2019-07-20 DIAGNOSIS — I6389 Other cerebral infarction: Secondary | ICD-10-CM

## 2019-07-20 DIAGNOSIS — Z978 Presence of other specified devices: Secondary | ICD-10-CM

## 2019-07-20 DIAGNOSIS — R569 Unspecified convulsions: Secondary | ICD-10-CM

## 2019-07-20 LAB — BASIC METABOLIC PANEL
Anion gap: 9 (ref 5–15)
BUN: 15 mg/dL (ref 6–20)
CO2: 20 mmol/L — ABNORMAL LOW (ref 22–32)
Calcium: 8.1 mg/dL — ABNORMAL LOW (ref 8.9–10.3)
Chloride: 119 mmol/L — ABNORMAL HIGH (ref 98–111)
Creatinine, Ser: 1.06 mg/dL (ref 0.61–1.24)
GFR calc Af Amer: >60 mL/min (ref >60)
GFR calc non Af Amer: >60 mL/min (ref >60)
Glucose, Bld: 140 mg/dL — ABNORMAL HIGH (ref 70–99)
Potassium: 4 mmol/L (ref 3.5–5.1)
Sodium: 148 mmol/L — ABNORMAL HIGH (ref 135–145)

## 2019-07-20 LAB — MAGNESIUM
Magnesium: 2.2 mg/dL (ref 1.7–2.4)
Magnesium: 2.5 mg/dL — ABNORMAL HIGH (ref 1.7–2.4)

## 2019-07-20 LAB — HEMOGLOBIN A1C
Hgb A1c MFr Bld: 5.4 % (ref 4.8–5.6)
Mean Plasma Glucose: 108.28 mg/dL

## 2019-07-20 LAB — URINE CULTURE: Culture: NO GROWTH

## 2019-07-20 LAB — POCT I-STAT 7, (LYTES, BLD GAS, ICA,H+H)
Acid-base deficit: 2 mmol/L (ref 0.0–2.0)
Acid-base deficit: 3 mmol/L — ABNORMAL HIGH (ref 0.0–2.0)
Bicarbonate: 22.4 mmol/L (ref 20.0–28.0)
Bicarbonate: 22.7 mmol/L (ref 20.0–28.0)
Calcium, Ion: 1.17 mmol/L (ref 1.15–1.40)
Calcium, Ion: 1.19 mmol/L (ref 1.15–1.40)
HCT: 40 % (ref 39.0–52.0)
HCT: 41 % (ref 39.0–52.0)
Hemoglobin: 13.6 g/dL (ref 13.0–17.0)
Hemoglobin: 13.9 g/dL (ref 13.0–17.0)
O2 Saturation: 99 %
O2 Saturation: 99 %
Patient temperature: 98.6
Patient temperature: 98.6
Potassium: 4 mmol/L (ref 3.5–5.1)
Potassium: 4.2 mmol/L (ref 3.5–5.1)
Sodium: 147 mmol/L — ABNORMAL HIGH (ref 135–145)
Sodium: 149 mmol/L — ABNORMAL HIGH (ref 135–145)
TCO2: 24 mmol/L (ref 22–32)
TCO2: 24 mmol/L (ref 22–32)
pCO2 arterial: 38.9 mmHg (ref 32.0–48.0)
pCO2 arterial: 40.8 mmHg (ref 32.0–48.0)
pH, Arterial: 7.348 — ABNORMAL LOW (ref 7.350–7.450)
pH, Arterial: 7.374 (ref 7.350–7.450)
pO2, Arterial: 146 mmHg — ABNORMAL HIGH (ref 83.0–108.0)
pO2, Arterial: 174 mmHg — ABNORMAL HIGH (ref 83.0–108.0)

## 2019-07-20 LAB — CK TOTAL AND CKMB (NOT AT ARMC)
CK, MB: 6.9 ng/mL — ABNORMAL HIGH (ref 0.5–5.0)
Total CK: 7768 U/L — ABNORMAL HIGH (ref 49–397)

## 2019-07-20 LAB — LIPID PANEL
Cholesterol: 140 mg/dL (ref 0–200)
HDL: 40 mg/dL — ABNORMAL LOW (ref >40)
LDL Cholesterol: 83 mg/dL (ref 0–99)
Total CHOL/HDL Ratio: 3.5 RATIO
Triglycerides: 85 mg/dL (ref <150)
VLDL: 17 mg/dL (ref 0–40)

## 2019-07-20 LAB — ECHOCARDIOGRAM COMPLETE
Height: 71 in
Weight: 3616 oz

## 2019-07-20 LAB — SODIUM
Sodium: 146 mmol/L — ABNORMAL HIGH (ref 135–145)
Sodium: 146 mmol/L — ABNORMAL HIGH (ref 135–145)
Sodium: 149 mmol/L — ABNORMAL HIGH (ref 135–145)
Sodium: 151 mmol/L — ABNORMAL HIGH (ref 135–145)

## 2019-07-20 LAB — PHOSPHORUS
Phosphorus: 2 mg/dL — ABNORMAL LOW (ref 2.5–4.6)
Phosphorus: 2 mg/dL — ABNORMAL LOW (ref 2.5–4.6)

## 2019-07-20 LAB — HIV ANTIBODY (ROUTINE TESTING W REFLEX): HIV Screen 4th Generation wRfx: NONREACTIVE

## 2019-07-20 LAB — TRIGLYCERIDES: Triglycerides: 138 mg/dL (ref <150)

## 2019-07-20 MED ORDER — SODIUM CHLORIDE 23.4 % INJECTION (4 MEQ/ML) FOR IV ADMINISTRATION
120.0000 meq | Freq: Once | INTRAVENOUS | Status: AC
  Administered 2019-07-20: 120 meq via INTRAVENOUS
  Filled 2019-07-20: qty 30

## 2019-07-20 MED ORDER — CHLORHEXIDINE GLUCONATE 0.12% ORAL RINSE (MEDLINE KIT)
15.0000 mL | Freq: Two times a day (BID) | OROMUCOSAL | Status: DC
  Administered 2019-07-20 – 2019-07-26 (×5): 15 mL via OROMUCOSAL

## 2019-07-20 MED ORDER — SODIUM CHLORIDE 0.9% FLUSH
10.0000 mL | INTRAVENOUS | Status: DC | PRN
  Administered 2019-08-02 – 2019-09-03 (×2): 10 mL
  Filled 2019-07-20 (×2): qty 40

## 2019-07-20 MED ORDER — PHENYLEPHRINE 40 MCG/ML (10ML) SYRINGE FOR IV PUSH (FOR BLOOD PRESSURE SUPPORT)
PREFILLED_SYRINGE | INTRAVENOUS | Status: AC
  Filled 2019-07-20: qty 10

## 2019-07-20 MED ORDER — LEVETIRACETAM IN NACL 500 MG/100ML IV SOLN
500.0000 mg | Freq: Two times a day (BID) | INTRAVENOUS | Status: DC
  Administered 2019-07-20 – 2019-07-26 (×13): 500 mg via INTRAVENOUS
  Filled 2019-07-20 (×13): qty 100

## 2019-07-20 MED ORDER — ATORVASTATIN CALCIUM 10 MG PO TABS
20.0000 mg | ORAL_TABLET | Freq: Every day | ORAL | Status: DC
  Administered 2019-07-20: 18:00:00 20 mg via ORAL
  Filled 2019-07-20 (×2): qty 2

## 2019-07-20 MED ORDER — ASPIRIN EC 325 MG PO TBEC: 325.0000 mg | DELAYED_RELEASE_TABLET | Freq: Every day | ORAL | Status: DC

## 2019-07-20 MED ORDER — LEVETIRACETAM IN NACL 1000 MG/100ML IV SOLN
1000.0000 mg | Freq: Once | INTRAVENOUS | Status: AC
  Administered 2019-07-20: 1000 mg via INTRAVENOUS
  Filled 2019-07-20: qty 100

## 2019-07-20 MED ORDER — ORAL CARE MOUTH RINSE
15.0000 mL | OROMUCOSAL | Status: DC
  Administered 2019-07-20 – 2019-07-25 (×54): 15 mL via OROMUCOSAL

## 2019-07-20 MED ORDER — VITAL HIGH PROTEIN PO LIQD: 1000.0000 mL | ORAL | Status: DC

## 2019-07-20 MED ORDER — LEVETIRACETAM IN NACL 1000 MG/100ML IV SOLN: 1000.0000 mg | Freq: Two times a day (BID) | INTRAVENOUS | Status: DC

## 2019-07-20 MED ORDER — VITAL HIGH PROTEIN PO LIQD
1000.0000 mL | ORAL | Status: DC
  Administered 2019-07-20 – 2019-07-26 (×6): 1000 mL
  Filled 2019-07-20: qty 1000

## 2019-07-20 MED ORDER — CHLORHEXIDINE GLUCONATE CLOTH 2 % EX PADS
6.0000 | MEDICATED_PAD | Freq: Every day | CUTANEOUS | Status: DC
  Administered 2019-07-21: 6 via TOPICAL

## 2019-07-20 MED ORDER — PRO-STAT SUGAR FREE PO LIQD
30.0000 mL | Freq: Two times a day (BID) | ORAL | Status: DC
  Administered 2019-07-20: 30 mL
  Filled 2019-07-20: qty 30

## 2019-07-20 MED ORDER — FAMOTIDINE IN NACL 20-0.9 MG/50ML-% IV SOLN
20.0000 mg | Freq: Two times a day (BID) | INTRAVENOUS | Status: DC
  Administered 2019-07-20 – 2019-07-22 (×5): 20 mg via INTRAVENOUS
  Filled 2019-07-20 (×5): qty 50

## 2019-07-20 MED ORDER — SODIUM CHLORIDE 3 % IV SOLN
INTRAVENOUS | Status: DC
  Administered 2019-07-20 – 2019-07-22 (×8): 75 mL/h via INTRAVENOUS
  Filled 2019-07-20 (×19): qty 500

## 2019-07-20 MED ORDER — HEPARIN SODIUM (PORCINE) 5000 UNIT/ML IJ SOLN
5000.0000 [IU] | Freq: Three times a day (TID) | INTRAMUSCULAR | Status: DC
  Administered 2019-07-20 – 2019-07-26 (×18): 5000 [IU] via SUBCUTANEOUS
  Filled 2019-07-20 (×18): qty 1

## 2019-07-20 MED ORDER — PRO-STAT SUGAR FREE PO LIQD
60.0000 mL | Freq: Four times a day (QID) | ORAL | Status: DC
  Administered 2019-07-20 – 2019-07-27 (×23): 60 mL
  Filled 2019-07-20 (×21): qty 60

## 2019-07-20 MED ORDER — SODIUM CHLORIDE 0.9% FLUSH
10.0000 mL | Freq: Two times a day (BID) | INTRAVENOUS | Status: DC
  Administered 2019-07-20: 10 mL
  Administered 2019-07-20: 30 mL
  Administered 2019-07-21 – 2019-07-24 (×7): 10 mL
  Administered 2019-07-25: 30 mL
  Administered 2019-07-26 – 2019-08-12 (×32): 10 mL
  Administered 2019-08-12: 10:00:00 20 mL
  Administered 2019-08-13 – 2019-08-19 (×10): 10 mL
  Administered 2019-08-19 – 2019-08-20 (×2): 30 mL
  Administered 2019-08-20 – 2019-08-21 (×2): 10 mL
  Administered 2019-08-21 – 2019-08-22 (×2): 20 mL
  Administered 2019-08-22 – 2019-08-23 (×2): 10 mL
  Administered 2019-08-23: 10:00:00 30 mL
  Administered 2019-08-24 – 2019-09-01 (×13): 10 mL
  Administered 2019-09-02: 20 mL
  Administered 2019-09-02 – 2019-09-06 (×8): 10 mL

## 2019-07-20 MED ORDER — ASPIRIN 300 MG RE SUPP
300.0000 mg | Freq: Every day | RECTAL | Status: DC
  Administered 2019-07-20: 300 mg via RECTAL
  Filled 2019-07-20: qty 1

## 2019-07-20 MED ORDER — PROPOFOL 1000 MG/100ML IV EMUL
5.0000 ug/kg/min | INTRAVENOUS | Status: DC
  Administered 2019-07-20 (×4): 40 ug/kg/min via INTRAVENOUS
  Administered 2019-07-20 – 2019-07-21 (×3): 55 ug/kg/min via INTRAVENOUS
  Administered 2019-07-21: 35 ug/kg/min via INTRAVENOUS
  Administered 2019-07-21: 03:00:00 55 ug/kg/min via INTRAVENOUS
  Administered 2019-07-21: 15:00:00 30 ug/kg/min via INTRAVENOUS
  Administered 2019-07-21: 50 ug/kg/min via INTRAVENOUS
  Administered 2019-07-22 (×2): 40 ug/kg/min via INTRAVENOUS
  Administered 2019-07-22: 06:00:00 50 ug/kg/min via INTRAVENOUS
  Administered 2019-07-22: 35 ug/kg/min via INTRAVENOUS
  Administered 2019-07-22: 03:00:00 50 ug/kg/min via INTRAVENOUS
  Administered 2019-07-23: 45 ug/kg/min via INTRAVENOUS
  Administered 2019-07-23 (×3): 40 ug/kg/min via INTRAVENOUS
  Administered 2019-07-23: 45 ug/kg/min via INTRAVENOUS
  Administered 2019-07-24: 30 ug/kg/min via INTRAVENOUS
  Administered 2019-07-24 (×2): 40 ug/kg/min via INTRAVENOUS
  Administered 2019-07-24: 30 ug/kg/min via INTRAVENOUS
  Administered 2019-07-24: 40 ug/kg/min via INTRAVENOUS
  Administered 2019-07-25: 30 ug/kg/min via INTRAVENOUS
  Administered 2019-07-25: 40 ug/kg/min via INTRAVENOUS
  Filled 2019-07-20 (×6): qty 100
  Filled 2019-07-20: qty 200
  Filled 2019-07-20 (×18): qty 100
  Filled 2019-07-20: qty 200
  Filled 2019-07-20: qty 100

## 2019-07-20 MED ORDER — MIDAZOLAM HCL 2 MG/2ML IJ SOLN
1.0000 mg | INTRAMUSCULAR | Status: DC | PRN
  Administered 2019-07-20 – 2019-07-21 (×4): 2 mg via INTRAVENOUS
  Filled 2019-07-20 (×4): qty 2

## 2019-07-20 NOTE — Progress Notes (Signed)
STAT EEG completed; results pending. Dr Yadav notified. 

## 2019-07-20 NOTE — Telephone Encounter (Signed)
Pt's mother Blima Rich on [place]- [phone].

## 2019-07-20 NOTE — Progress Notes (Signed)
CTH at 0444 hrs shows post hemicrani changes and hypodensity in the right MCA/ACA territory with mild improvement in the leftward shift. No bleeding.   Stroke team to continue to follow.  -- Amie Portland, MD [facility] Pager: [phone] If 7pm to 7am, please call on call as listed on AMION.

## 2019-07-20 NOTE — Progress Notes (Addendum)
[facility] Progress Note Patient Name: [name] DOB: 1976-02-04 MRN: [id number]   Date of Service  07/20/2019  HPI/Events of Note  Seizure-like activity while being suctioned. Pt needs restraints to prevent self-extubation.  eICU Interventions  Keppra 1 gm iv Q 12 hours, cEEG,  Versed 1-2 mg iv Q 1 hour prn seizures, RN instructed to notify Dr. Rory Percy. Restraints ordered.        Madalin Hughart U [place]ig[place] 07/20/2019, 1:25 AM

## 2019-07-20 NOTE — Progress Notes (Signed)
[facility] Progress Note Patient Name: Matthew Black DOB: Aug 18, 1976 MRN: [id number]   Date of Service  07/20/2019  HPI/Events of Note  Pt needs a foley catheter as well as stress ulcer prophylaxis  eICU Interventions  Order to insert foley entered, Pepcid 20 mg bid via NG Tube        Matthew Black U [name] 07/20/2019, 5:23 AM

## 2019-07-20 NOTE — Progress Notes (Addendum)
NAME:  BHUPINDER PLAGENS, MRN:  [id number], DOB:  05/15/1976, LOS: 1 ADMISSION DATE:  07/19/2019, CONSULTATION DATE:  8/25 REFERRING MD:  EDP, CHIEF COMPLAINT:  Stroke   Brief History   43yo male with no known PMH presented 8/25 after being found down at work at [employer] by a coworker covered in vomit and urine.  Last seen normal by his wife 830am 8/24 before he left for work.  CT head showed large R hemisphere infarct with edema and mild mass effect. He was admitted by neurology, placed on 3% saline and PCCM asked to consult for ICU assistance.     Past Medical History  None.  Significant Hospital Events   8/25 > admit. To OR for decompressive crani; returned on vent  8/26 seizure activity; loaded w keppra  Consults:  PCCM. Neurology.  Procedures:  None.  Significant Diagnostic Tests:  MR brain 8/25>>> The right ICA is occluded from its origin. The right ICA terminus, right ACA and MCA also appear occluded.  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 Cytotoxic edema but no convincing hemorrhage. Stable mild mass effect with trace leftward midline shift.  CT head 8/26:  cerebral edema decompressing thru the craniotomy  Echo 8/25 >   Micro Data:  COVID 8/25 > negative  Antimicrobials:  None.  Interim history/subjective:  Sedated, had possible seizure activity earlier today involving rhythmic movement of the right hand/wrist and right leg currently on propofol infusion  Objective   Blood pressure 128/81, pulse 73, temperature 98.7 F (37.1 C), temperature source Axillary, resp. rate 15, height 5\' 11"  (1.803 m), weight 102.5 kg, SpO2 100 %.    Vent Mode: PRVC FiO2 (%):  [40 %-60 %] 40 % Set Rate:  [14 bmp-15 bmp] 14 bmp Vt Set:  [600 mL] 600 mL PEEP:  [5 cmH20] 5 cmH20 Plateau Pressure:  [15 cmH20-16 cmH20] 15 cmH20   Intake/Output Summary (Last 24 hours) at 07/20/2019 0817 Last data filed at 07/20/2019 [id number] Gross per 24 hour  Intake 1513.81 ml   Output 850 ml  Net 663.81 ml   Filed Weights   07/20/19 0000  Weight: 102.5 kg    Examination: General this is a 43 year old black male currently resting/sedated on full ventilatory support he is unresponsive currently but apparently recently received bolus for ventilator compliance HEENT normocephalic atraumatic pupils pinpoint but reactive no JVD, craniectomy dressing on the right and intact orally intubated no JVD mucous membranes moist Pulmonary: Clear to auscultation with equal chest rise ABG evaluated and acceptable Cardiac: Regular rate and rhythm without murmur rub or gallop Abdomen: Soft nontender, bone flap dressing intact to right lower quadrant positive bowel sounds GU: Clear yellow Neuro: Currently sedated.  Previously with spontaneously move on the right, was plegic on the left.    Assessment & Plan:   Large right hemispheric infarct (ICA,MCA & ACA occlusion) s/p right decompressive hemicraniectomy 8/25 - unclear etiology at this point.   ->Covid: neg ->CRP and LDH slighty elevated ->CT brain 8/26 showing cerebral edema decompressing thru the craniotomy  Plan Post op care per neuro and neuro surg Cont 3% protocol (goal Na 150-155) Serial neuro checks F/u imaging TBD per neuro team  Probable seizure Plan keppra started ? LTM? Defer to neuro team    Acute respiratory failure in setting of ineffective airway protection  Portable chest x-ray personally reviewed: Endotracheal tubes about 4.5 cm above the carina there is no infiltrate or airspace disease appreciated Plan Continue full ventilator  support PAD protocol, RASS goal 0 to -1, may need to reevaluate this based on neuro goals VAP bundle  At risk for abdominal rhabdo -Last total CK 7533 Plan Repeat total CKs this a.m. Hydrate as able Follow-up blood chemistry       Best practice:  Diet: NPO.  Will start tube feeds Pain/Anxiety/Delirium protocol (if indicated): N/A. VAP protocol (if  indicated): N/A. DVT prophylaxis: Per primary. GI prophylaxis: N/A. Glucose control: SSI if glucose consistently > 180. Mobility: Bedrest. Code Status: Full. Family Communication: Wife. Disposition:  ICU.  Critically ill due to large right hemisphere occlusive CVA with left-sided hemiplegia complicated by possible seizure this morning.  He remains dependent on mechanical ventilation for airway protection.  Most recent CT imaging of brain suggests worsening cerebral edema likely he has had a decompressive craniectomy we will continue full ventilator support and 3% NS protocol. F/u routine chemistries and start supportive care including tubefeeds and place PICC I suspect he will have a long course  Critical care x32 minutes  Erick Colace ACNP-BC [place] Pager # [phone] OR # [phone] if no answer

## 2019-07-20 NOTE — Progress Notes (Signed)
SLP Cancellation Note  Patient Details Name: KYDAN POPESCU MRN: [id number] DOB: 11/26/1975   Cancelled treatment:       Reason Eval/Treat Not Completed: Medical issues which prohibited therapy(Pt is currently on teh vent. SLP will follow up. )  Trey Gulbranson I. Hardin Negus, [place], [place] Office number [phone] Pager [phone]  Horton Marshall 07/20/2019, 7:54 AM

## 2019-07-20 NOTE — Progress Notes (Signed)
vLTM EEG running following spot EEG. Notified Neuro 

## 2019-07-20 NOTE — Progress Notes (Signed)
Concern for 30 sec episode of whole body shaking for seizure. Unclear description. PCCM ordered Keppra - I agree to use a dose for now. Will continue if he has more episodes. As for EEG, will get a routine EEG in the AM. Do not see the need for LTM as of right now.   Plan d/w patient RN  Will continue to follow.  -- Amie Portland, MD [facility] Pager: [phone] If 7pm to 7am, please call on call as listed on AMION.

## 2019-07-20 NOTE — Telephone Encounter (Signed)
LMOM Advise Matthew Black that I know Matthew Black is in the hospital, I get regular updates, he is in very serious condition, I am confident he is getting excellent care. Encouraged to call me if she wish to speak with me.

## 2019-07-20 NOTE — Progress Notes (Signed)
STROKE TEAM PROGRESS NOTE   INTERVAL HISTORY Pt still intubated on vent. On propofol. Not responsive to vice, not open eyes. Slight movement of right side on the pain stimulation but not left. Had right hemicrani overnight, CT no bleeding, improved MLS. On 3% saline. CCM reported this am pt had some right hand and foot rhythmic shaking. Dr. Rory Black reported around midnight he had 30 sec of whole body shaking. Was loaded with Keppra. Will continue keppra and put on LTM EEG.   Vitals:   07/20/19 0500 07/20/19 0600 07/20/19 0700 07/20/19 0723  BP:   128/81   Pulse: 78 87 73   Resp: 18 19 15    Temp:    (!) 101.4 F (38.6 C)  TempSrc:    Axillary  SpO2: 100% 100%  100%  Weight:      Height:        CBC:  Recent Labs  Lab 07/19/19 1040 07/20/19 0019 07/20/19 0417  WBC 16.7*  --   --   NEUTROABS 14.2*  --   --   HGB 16.8 13.9 13.6  HCT 50.2 41.0 40.0  MCV 85.4  --   --   PLT 275  --   --     Basic Metabolic Panel:  Recent Labs  Lab 07/19/19 1454  07/20/19 0019  07/20/19 0417 07/20/19 0617  NA 143   < > 147*   < > 149* 146*  K 3.8  --  4.2  --  4.0  --   CL 107  --   --   --   --   --   CO2 22  --   --   --   --   --   GLUCOSE 116*  --   --   --   --   --   BUN 18  --   --   --   --   --   CREATININE 1.43*  --   --   --   --   --   CALCIUM 9.7  --   --   --   --   --    < > = values in this interval not displayed.   Lipid Panel:     Component Value Date/Time   CHOL 140 07/20/2019 0158   TRIG 138 07/20/2019 0617   HDL 40 (L) 07/20/2019 0158   CHOLHDL 3.5 07/20/2019 0158   VLDL 17 07/20/2019 0158   LDLCALC 83 07/20/2019 0158   HgbA1c:  Lab Results  Component Value Date   HGBA1C 5.4 07/20/2019   Urine Drug Screen:     Component Value Date/Time   LABOPIA NONE DETECTED 07/19/2019 1348   COCAINSCRNUR NONE DETECTED 07/19/2019 1348   LABBENZ NONE DETECTED 07/19/2019 1348   AMPHETMU NONE DETECTED 07/19/2019 1348   THCU POSITIVE (A) 07/19/2019 1348   LABBARB NONE  DETECTED 07/19/2019 1348    Alcohol Level     Component Value Date/Time   ETH <10 07/19/2019 1040    IMAGING Ct Head Wo Contrast Ct Cervical Spine Wo Contrast  07/19/2019 1132 1. 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 2. No associated hemorrhage and mild intracranial mass effect at this time, including trace leftward midline shift. 3. Evidence of large vessel occlusion: Hyperdensity of the right ICA terminus, the right A1 and MCA. 4. Unaffected brain parenchyma appears negative. 5.  No acute traumatic injury identified in the cervical spine.   Mr  Brain Wo Contrast Mr Angio Head Wo Contrast Mr Angio Neck Wo Contrast 07/19/2019 1330 1. 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 The right ICA terminus, right ACA and MCA also appear occluded. 2. 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 Cytotoxic edema but no convincing hemorrhage. Stable mild mass effect with trace leftward midline shift. 3. Intracranial MRA is degraded by motion artifact, but no other large vessel occlusion is suspected. There appears to be lobes reperfusion of the right PCA. 4. The left hemisphere and posterior fossa brain parenchyma appears normal.   Ct Head Wo Contrast 07/19/2019 1532 Similar appearance to earlier. Low-density and swelling of the right hemisphere in the ACA and MCA territories with mass effect and right-to-left shift of 5-6 mm. No hemorrhagic transformation at this time.   Ct Head Wo Contrast 07/20/2019 0444 1. Acute right ACA and MCA territory infarct with progressive swelling that has decompressed through the craniectomy defect. 2. No acute hemorrhage or new infarction.    PHYSICAL EXAM  Temp:  [98.2 F (36.8 C)-103.1 F (39.5 C)] 100.5 F (38.1 C) (08/26 0900) Pulse Rate:  [63-91] 91 (08/26 0930) Resp:  [15-33] 19 (08/26 0930) BP: (87-136)/(34-83) 119/67 (08/26 0930) SpO2:  [94 %-100 %] 100 % (08/26  0930) Arterial Line BP: (123-170)/(62-93) 146/72 (08/26 0930) FiO2 (%):  [40 %-60 %] 40 % (08/26 0723) Weight:  [102.5 kg] 102.5 kg (08/26 0000)  General - Well nourished, well developed, intubated on sedation.  Ophthalmologic - fundi not visualized due to noncooperation.  Cardiovascular - Regular rate and rhythm.  Neuro - intubated off sedation, eyes close, not following commands. With forced eye opening, eyes in mid position, not blinking to visual threat, doll's eyes absence, not tracking, small pupils 24mm each, not reactive to light. Corneal reflex present weakly, gag and cough present but also weak. Breathing over the vent.  Facial symmetry not able to test due to ET tube.  Tongue protrusion not cooperative. On pain stimulation, slight movement of RUE and mild withdraw of RLE with head horizontal movement. DTR 1+ and no babinski. Sensation, coordination and gait not tested.   ASSESSMENT/PLAN [name] is a 43 y.o. male with no significant past medical history found down x 2 days nonverbal with L hemiparesis.   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 Trace L midline shift. ELVO at R ICA, R A1, R MCA.  MRI  Large R brain infarct sparing R PCA. Cytotoxic edema but no hemorrhage. Stable trace midline shift.  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 R ICA terminus, R ACA, R MCA occluded.  CT head similar w/ low density and sweddling R ACA and MCA territories, now with 5-65mm midline shift. no hemorrhage  CT head acute R ACA and MCA infarct w/ progressive swelling decompressed through craniectomy defect.   CTA head and neck pending in am  2D Echo EF 60-65%  LE venous doppler pending  May consider TEE later once stable if above work up negative.  LDL 83  HgbA1c 5.4  UDS positive THC  Hypercoagulable and  autoimmune work up pending  Heparin subq for VTE prophylaxis  No antithrombotic prior to admission, now on aspirin 325mg  daily.   Therapy recommendations:  pending   Disposition:  pending   Cyctotoxic  cerebral edema  S/p R decompressive hemicraniectomy (Matthew Black) w/ flap R abd  On 3% saline protocol  Na 146->148  One dose of 23.4% to boost Na level  Goal Na 150-155  Will put in PICC line  Acute Respiratory Failure   Intubated for airway protection  CCM on board   On vent  On sedation  Possible Seizure  Treated with Keppra   EEG pending  Seizure precautions  Febrile, Leukocytosis  Tmax 103.1->101.4  WBC 16.7  Vomited PTA, could be due to aspiration  CXR NAD  Hyperlipidemia  Home meds:  no statin  LDL 83, goal < 70  Add lipitor 20 mg daily   Continue statin at discharge  Dysphagia . Secondary to stroke . NPO . On TF @ 40  Other Stroke Risk Factors  Former Cigarette smoker, quit 3 yrs ago  ETOH use  Substance abuse UDS - POSITIVE THC  Obesity, Body mass index is 31.52 kg/m., recommend weight loss, diet and exercise as appropriate   Other Active Problems  At risk for rhabdo CK 7533. Hydrate as able.  Hospital day # 1  This patient is critically ill due to large right MCA and ACA infarct, s/p hemicrani, seizure, cerebral edema and at significant risk of neurological worsening, death form brain herniation, malignant cerebral edema, status epilepticus, respiratory failure, sepsis. This patient's care requires constant monitoring of vital signs, hemodynamics, respiratory and cardiac monitoring, review of multiple databases, neurological assessment, discussion with family, other specialists and medical decision making of high complexity. I spent 40 minutes of neurocritical care time in the care of this patient. I had long discussion with wife over the phone, updated pt current condition, treatment plan and potential prognosis. They  expressed understanding and appreciation.   Rosalin Hawking, MD PhD Stroke Neurology 07/20/2019 3:57 PM   To contact Stroke Continuity provider, please refer to [email or web address] After hours, contact General Neurology

## 2019-07-20 NOTE — Progress Notes (Signed)
[facility] PCCM PROGRESS NOTE  S: 43yo male with no known PMH presented 8/25 after being found down at work at [employer] by a coworker covered in vomit and urine.  Last seen normal by his wife 830am 8/24 before he left for work.  CT head showed large R hemisphere infarct with edema and mild mass effect. He was admitted to neurology on hypertonic saline.   Update: He was then taken to OR in the late PM hours of 8/25 for hemi-craniectomy.Post operatively he remained on ventilator and PCCM was asked to see for further vent care.   O: BP (!) 99/54 (BP Location: Right Arm)   Pulse 77   Temp 98.6 F (37 C) (Oral)   Resp 15   Ht 5\' 11"  (1.803 m)   Wt 102.5 kg Comment: from 2019 records, must be updated  SpO2 100%   BMI 31.52 kg/m   General:  Middle aged male on vent Neuro:  Sedated HEENT: Surgical dressings in place. Skin breakdown to L periorbital area Cardiovascular:  RRR, no MRG Lungs:  Clear Abdomen:  Soft, non-tender, non-distended Musculoskeletal:  No acute deformity Skin:  Intact, MMM   A/P:  CVA:  - Stroke workup per neuro. - 3% NS per neuro. - F/u echo, repeat CT.  Acute hypoxemic respiratory failure secondary to above - Full vent support - CXR for ETT placement - ABG - Daily WUA and SBT - Propofol and PRN fentanyl for RASS goal 0 to -1.   At risk rhabdo - unknown downtime, last seen normal over 24 hours prior to ED presentation. - F/u CK, CMP.  Critical care time 25 minutes.  Critical care was time spent personally by me on the following activities: development of treatment plan with patient and/or surrogate as well as nursing, discussions with consultants, evaluation of patient's response to treatment, examination of patient, obtaining history from patient or surrogate, ordering and performing treatments and interventions, ordering and review of laboratory studies, ordering and review of radiographic studies, pulse oximetry and re-evaluation of patient's condition.  Georgann Housekeeper, ACNP [facility] Pulmonology/Critical Care Pager [phone] or [phone]

## 2019-07-20 NOTE — Progress Notes (Signed)
Nurse called into room for questionable seizure like activity. Patient noted to be tremoring in bilateral upper extremities, and right lower extremity. EEG button pushed. PRN Versed administered. Wife at bedside and updated. All questions answered. Lianne Bushy RN BSN.

## 2019-07-20 NOTE — Progress Notes (Signed)
Nutrition Follow-up  DOCUMENTATION CODES:   Obesity unspecified  INTERVENTION:   Tube feeding: - Vital High Protein @ 20 ml/hr (480 ml/day) via OGT - Pro-stat 60 ml QID  Tube feeding regimen provides 1280 kcal, 162 grams of protein, and 401 ml of H2O.   Tube feeding regimen and current propofol provides 1914 total kcal (>100% of needs).  NUTRITION DIAGNOSIS:   Inadequate oral intake related to acute illness as evidenced by NPO status.  GOAL:   Provide needs based on ASPEN/SCCM guidelines  MONITOR:   Vent status, Labs, Weight trends, TF tolerance, Skin, I & O's  REASON FOR ASSESSMENT:   Ventilator, Consult Enteral/tube feeding initiation and management  ASSESSMENT:   43 year old male who presented to the ED on 8/25 with AMS. No known PMH. CT head showed large R hemisphere infarct with edema and mild mass effect.   8/25 - s/p emergent decompressive hemicraniectomy  Pt remains on the ventilator post-op. Probable seizures lat night.  RD consulted for tube feeding initiation and management. CCM has ordered Adult ICU Tube Feeding Protocol. RD will adjust to better meet pt's needs given propofol rate.  Discussed pt with RN who was at bedside providing nursing care.  OG tube in place, currently clamped.  Patient is currently intubated on ventilator support MV: 13.7 L/min Temp (24hrs), Avg:100.6 F (38.1 C), Min:98.2 F (36.8 C), Max:103.1 F (39.5 C)  Drips: Propofol: 24 ml/hr (provides 634 kcal daily from lipid) 3% saline: 40 ml/hr  Medications reviewed and include: IV Pepcid  Labs reviewed: sodium 146 CBG: 123  UOP: 600 ml x 24 hours I/O's: +1.2 L since admit  NUTRITION - FOCUSED PHYSICAL EXAM:    Most Recent Value  Orbital Region  No depletion  Upper Arm Region  No depletion  Thoracic and Lumbar Region  No depletion  Buccal Region  No depletion  Temple Region  No depletion  Clavicle Bone Region  No depletion  Clavicle and Acromion Bone Region  No  depletion  Scapular Bone Region  Unable to assess  Dorsal Hand  No depletion  Patellar Region  No depletion  Anterior Thigh Region  No depletion  Posterior Calf Region  No depletion  Edema (RD Assessment)  None  Hair  Reviewed  Eyes  Unable to assess  Mouth  Unable to assess  Skin  Reviewed  Nails  Reviewed       Diet Order:   Diet Order            Diet NPO time specified  Diet effective now              EDUCATION NEEDS:   No education needs have been identified at this time  Skin:  Skin Assessment: Skin Integrity Issues: Stage II: left face Incisions: abdomen, head  Last BM:  no documented BM  Height:   Ht Readings from Last 1 Encounters:  07/20/19 5\' 11"  (1.803 m)    Weight:   Wt Readings from Last 1 Encounters:  07/20/19 102.5 kg    Ideal Body Weight:  78.2 kg  BMI:  Body mass index is 31.52 kg/m.  Estimated Nutritional Needs:   Kcal:  [id number]  Protein:  156-175 grams  Fluid:  >/= 1.5 L    Gaynell Face, MS, RD, LDN Inpatient Clinical Dietitian Pager: [phone] Weekend/After Hours: [phone]

## 2019-07-20 NOTE — Progress Notes (Signed)
PT Cancellation Note  Patient Details Name: Matthew Black MRN: [id number] DOB: Apr 14, 1976   Cancelled Treatment:    Reason Eval/Treat Not Completed: Patient not medically ready (intubated).  Ellamae Sia, PT, DPT Acute Rehabilitation Services Pager [phone] Office [phone]    Willy Eddy 07/20/2019, 11:49 AM

## 2019-07-20 NOTE — Procedures (Signed)
Patient Name: Matthew Black  MRN: [id number]  Epilepsy Attending: Lora Havens  Referring Physician/Provider: Dr Rosalin Hawking Date: 07/20/2019 Duration: 24.55 mins  Patient history: 43yo M with right MCA/ACA acute infarct s/p craniotomy. He had 30 sec episode of whole body shaking for seizure. EEG to evaluate for seizure.  Level of alertness: sedated  AEDs during EEG study: keppra, versed, propofol  Technical aspects: This EEG study was done with scalp electrodes positioned according to the 10-20 International system of electrode placement. Electrical activity was acquired at a sampling rate of 500Hz  and reviewed with a high frequency filter of 70Hz  and a low frequency filter of 1Hz . EEG data were recorded continuously and digitally stored.   DESCRIPTION:  EEG showed continuous generalized 2-3 Hz delta slowing, maximal right frontal region. There is also an excessive amount of 15 to 18 Hz, 2-3 uV beta activity with irregular morphology distributed symmetrically and diffusely.  Sleep spindles were asymmetric, seen in frontocentral region, decreased on right. Hyperventilation and photic stimulation were not performed.  ABNORMALITY: 1. Continuous slowing, generalized, maximal right frontal region 2. Excessive beta activity 3. Sleep spindle asymmetry, decreased right  IMPRESSION: This study is suggestive of cortical dysfunction in right frontal region, likely secondary to underlying stroke and cerebral edema. There is also severe diffuse encephalopathy, likely due to sedation. No seizures or epileptiform discharges were seen throughout the recording.  The excessive beta activity seen in the background is most likely due to the effect of benzodiazepine and is a benign EEG pattern.    Aleksi Brummet Barbra Sarks

## 2019-07-20 NOTE — Progress Notes (Signed)
  NEUROSURGERY PROGRESS NOTE   Questionable seizure this am. Started on Keppra No other changes overnight.  EXAM:  BP 128/81   Pulse 73   Temp 98.7 F (37.1 C) (Axillary)   Resp 15   Ht 5\' 11"  (1.803 m)   Wt 102.5 kg Comment: from 2019 records, must be updated  SpO2 100%   BMI 31.52 kg/m   Intubated PERRL Spontaenously moves RUE/RLE Not following commands this am Crani incision: minimal dried blood on bandage, no active bleeding Abd incision: minimal blood on bandage, no active bleeding. Soft.   IMPRESSION/PLAN 43 y.o. male POD #1 right hemicraniectomy for right MCA/ACA infarct. Remains intubated, not followings commands this morning. - Continue current care per PCCM and Neuro - No new NS recs

## 2019-07-20 NOTE — Progress Notes (Signed)
Peripherally Inserted Central Catheter/Midline Placement  The IV Nurse has discussed with the patient and/or persons authorized to consent for the patient, the purpose of this procedure and the potential benefits and risks involved with this procedure.  The benefits include less needle sticks, lab draws from the catheter, and the patient may be discharged home with the catheter. Risks include, but not limited to, infection, bleeding, blood clot (thrombus formation), and puncture of an artery; nerve damage and irregular heartbeat and possibility to perform a PICC exchange if needed/ordered by physician.  Alternatives to this procedure were also discussed.  Bard Power PICC patient education guide, fact sheet on infection prevention and patient information card has been provided to patient /or left at bedside.  Consent obtained from wife due to altered mental status.  PICC/Midline Placement Documentation  PICC Triple Lumen [id number] PICC Right Basilic 46 cm 0 cm (Active)  Indication for Insertion or Continuance of Line Prolonged intravenous therapies;Administration of hyperosmolar/irritating solutions (i.e. TPN, Vancomycin, etc.) 07/20/19 1340  Exposed Catheter (cm) 0 cm 07/20/19 1340  Site Assessment Clean;Intact;Dry 07/20/19 1340  Lumen #1 Status Flushed;Saline locked;Blood return noted 07/20/19 1340  Lumen #2 Status Flushed;Saline locked;Blood return noted 07/20/19 1340  Lumen #3 Status Flushed;Saline locked;Blood return noted 07/20/19 1340  Dressing Type Transparent 07/20/19 1340  Dressing Status Clean;Dry;Intact 07/20/19 1340  Dressing Intervention New dressing 07/20/19 1340  Dressing Change Due 07/27/19 07/20/19 [place], Nicolette Bang 07/20/2019, 1:41 PM

## 2019-07-20 NOTE — Progress Notes (Signed)
Patient transported to and from CT w/o complications. 100% FiO2 during transport.

## 2019-07-20 NOTE — Anesthesia Postprocedure Evaluation (Signed)
Anesthesia Post Note  Patient: Matthew Black  Procedure(s) Performed: RIGHT HEMI-CRANIECTOMY With implantation of skull flap to abdominal wall (Right Head)     Patient location during evaluation: SICU Anesthesia Type: General Level of consciousness: sedated Pain management: pain level controlled Vital Signs Assessment: post-procedure vital signs reviewed and stable Respiratory status: patient remains intubated per anesthesia plan Cardiovascular status: stable Postop Assessment: no apparent nausea or vomiting Anesthetic complications: no    Last Vitals:  Vitals:   07/19/19 2355 07/20/19 0000  BP: (!) 99/54   Pulse: 73 77  Resp: (!) 28 15  Temp: 37 C   SpO2: 100% 100%    Last Pain:  Vitals:   07/19/19 2355  TempSrc: Oral  PainSc:                  Adilene Areola DAVID

## 2019-07-21 ENCOUNTER — Inpatient Hospital Stay (HOSPITAL_COMMUNITY): Payer: Medicaid Other

## 2019-07-21 ENCOUNTER — Encounter (HOSPITAL_COMMUNITY): Payer: Self-pay | Admitting: Diagnostic Radiology

## 2019-07-21 DIAGNOSIS — R1312 Dysphagia, oropharyngeal phase: Secondary | ICD-10-CM

## 2019-07-21 DIAGNOSIS — I639 Cerebral infarction, unspecified: Secondary | ICD-10-CM

## 2019-07-21 DIAGNOSIS — Z9889 Other specified postprocedural states: Secondary | ICD-10-CM

## 2019-07-21 DIAGNOSIS — I7771 Dissection of carotid artery: Secondary | ICD-10-CM

## 2019-07-21 HISTORY — PX: IR IVC FILTER PLMT / S&I /IMG GUID/MOD SED: IMG701

## 2019-07-21 HISTORY — PX: IR VENOGRAM RENAL UNI RIGHT: IMG681

## 2019-07-21 LAB — MAGNESIUM
Magnesium: 2.5 mg/dL — ABNORMAL HIGH (ref 1.7–2.4)
Magnesium: 2.2 mg/dL (ref 1.7–2.4)

## 2019-07-21 LAB — SODIUM
Sodium: 153 mmol/L — ABNORMAL HIGH (ref 135–145)
Sodium: 154 mmol/L — ABNORMAL HIGH (ref 135–145)
Sodium: 155 mmol/L — ABNORMAL HIGH (ref 135–145)
Sodium: 155 mmol/L — ABNORMAL HIGH (ref 135–145)

## 2019-07-21 LAB — BASIC METABOLIC PANEL
Anion gap: 8 (ref 5–15)
BUN: 16 mg/dL (ref 6–20)
CO2: 20 mmol/L — ABNORMAL LOW (ref 22–32)
Calcium: 8.3 mg/dL — ABNORMAL LOW (ref 8.9–10.3)
Chloride: 126 mmol/L — ABNORMAL HIGH (ref 98–111)
Creatinine, Ser: 0.96 mg/dL (ref 0.61–1.24)
GFR calc Af Amer: >60 mL/min (ref >60)
GFR calc non Af Amer: >60 mL/min (ref >60)
Glucose, Bld: 138 mg/dL — ABNORMAL HIGH (ref 70–99)
Potassium: 3.7 mmol/L (ref 3.5–5.1)
Sodium: 154 mmol/L — ABNORMAL HIGH (ref 135–145)

## 2019-07-21 LAB — LACTATE DEHYDROGENASE: LDH: 359 U/L — ABNORMAL HIGH (ref 98–192)

## 2019-07-21 LAB — PHOSPHORUS
Phosphorus: 1.7 mg/dL — ABNORMAL LOW (ref 2.5–4.6)
Phosphorus: 1.8 mg/dL — ABNORMAL LOW (ref 2.5–4.6)

## 2019-07-21 LAB — CK: Total CK: 7224 U/L — ABNORMAL HIGH (ref 49–397)

## 2019-07-21 LAB — MYOGLOBIN, URINE: Myoglobin, Ur: 17 ng/mL — ABNORMAL HIGH (ref 0–13)

## 2019-07-21 LAB — C-REACTIVE PROTEIN: CRP: 19.2 mg/dL — ABNORMAL HIGH (ref <1.0)

## 2019-07-21 LAB — FERRITIN: Ferritin: 224 ng/mL (ref 24–336)

## 2019-07-21 MED ORDER — ASPIRIN 325 MG PO TABS: 325.0000 mg | ORAL_TABLET | Freq: Every day | ORAL | Status: DC

## 2019-07-21 MED ORDER — LABETALOL HCL 5 MG/ML IV SOLN: 10.0000 mg | INTRAVENOUS | Status: DC | PRN

## 2019-07-21 MED ORDER — IOHEXOL 300 MG/ML  SOLN
100.0000 mL | Freq: Once | INTRAMUSCULAR | Status: AC | PRN
  Administered 2019-07-21: 18:00:00 30 mL via INTRAVENOUS

## 2019-07-21 MED ORDER — FENTANYL CITRATE (PF) 100 MCG/2ML IJ SOLN
25.0000 ug | INTRAMUSCULAR | Status: DC | PRN
  Administered 2019-07-21 – 2019-07-24 (×9): 25 ug via INTRAVENOUS
  Filled 2019-07-21 (×11): qty 2

## 2019-07-21 MED ORDER — FENTANYL CITRATE (PF) 100 MCG/2ML IJ SOLN
INTRAMUSCULAR | Status: AC
  Filled 2019-07-21: qty 2

## 2019-07-21 MED ORDER — LABETALOL HCL 5 MG/ML IV SOLN: 5.0000 mg | INTRAVENOUS | Status: DC | PRN

## 2019-07-21 MED ORDER — ASPIRIN 325 MG PO TABS
325.0000 mg | ORAL_TABLET | Freq: Every day | ORAL | Status: DC
  Administered 2019-07-21 – 2019-07-25 (×5): 325 mg
  Filled 2019-07-21 (×5): qty 1

## 2019-07-21 MED ORDER — LIDOCAINE HCL (PF) 1 % IJ SOLN
INTRAMUSCULAR | Status: AC | PRN
  Administered 2019-07-21: 10 mL

## 2019-07-21 MED ORDER — IOHEXOL 350 MG/ML SOLN
75.0000 mL | Freq: Once | INTRAVENOUS | Status: AC | PRN
  Administered 2019-07-21: 05:00:00 75 mL via INTRAVENOUS

## 2019-07-21 MED ORDER — SODIUM PHOSPHATES 45 MMOLE/15ML IV SOLN
10.0000 mmol | Freq: Once | INTRAVENOUS | Status: AC
  Administered 2019-07-21: 10 mmol via INTRAVENOUS
  Filled 2019-07-21: qty 3.33

## 2019-07-21 MED ORDER — IOHEXOL 300 MG/ML  SOLN
50.0000 mL | Freq: Once | INTRAMUSCULAR | Status: AC | PRN
  Administered 2019-07-21: 18:00:00 15 mL via INTRAVENOUS

## 2019-07-21 MED ORDER — LIDOCAINE HCL 1 % IJ SOLN
INTRAMUSCULAR | Status: AC
  Filled 2019-07-21: qty 20

## 2019-07-21 MED ORDER — ATORVASTATIN CALCIUM 10 MG PO TABS
20.0000 mg | ORAL_TABLET | Freq: Every day | ORAL | Status: DC
  Administered 2019-07-21 – 2019-07-30 (×9): 20 mg
  Filled 2019-07-21 (×8): qty 2

## 2019-07-21 NOTE — Progress Notes (Signed)
STROKE TEAM PROGRESS NOTE   INTERVAL HISTORY Pt still intubated on vent, still on propofol. As per RN, once propofol off, pt became agitated and BP shot up to 190s. Has to put back on propofol. Once off propofol, pt was able to follow simple commands on the right as per RN. CTA head and neck showed stable MLS and continues to show right ICA occlusion.    Vitals:   07/21/19 0700 07/21/19 0748 07/21/19 0751 07/21/19 0800  BP: 128/65 (!) 159/83 129/73 130/77  Pulse: 86 84 83 87  Resp: 18  17 18   Temp: 99.3 F (37.4 C)  99.9 F (37.7 C) 100 F (37.8 C)  TempSrc:      SpO2: 100%  100% 100%  Weight:      Height:        CBC:  Recent Labs  Lab 07/19/19 1040 07/20/19 0019 07/20/19 0417  WBC 16.7*  --   --   NEUTROABS 14.2*  --   --   HGB 16.8 13.9 13.6  HCT 50.2 41.0 40.0  MCV 85.4  --   --   PLT 275  --   --     Basic Metabolic Panel:  Recent Labs  Lab 07/20/19 0912  07/20/19 2033 07/20/19 2034 07/21/19 0401  NA 148*   < >  --  151* 154*  153*  K 4.0  --   --   --  3.7  CL 119*  --   --   --  126*  CO2 20*  --   --   --  20*  GLUCOSE 140*  --   --   --  138*  BUN 15  --   --   --  16  CREATININE 1.06  --   --   --  0.96  CALCIUM 8.1*  --   --   --  8.3*  MG 2.2  --  2.5*  --  2.5*  PHOS 2.0*  --  2.0*  --  1.7*   < > = values in this interval not displayed.   Lipid Panel:     Component Value Date/Time   CHOL 140 07/20/2019 0158   TRIG 138 07/20/2019 0617   HDL 40 (L) 07/20/2019 0158   CHOLHDL 3.5 07/20/2019 0158   VLDL 17 07/20/2019 0158   LDLCALC 83 07/20/2019 0158   HgbA1c:  Lab Results  Component Value Date   HGBA1C 5.4 07/20/2019   Urine Drug Screen:     Component Value Date/Time   LABOPIA NONE DETECTED 07/19/2019 1348   COCAINSCRNUR NONE DETECTED 07/19/2019 1348   LABBENZ NONE DETECTED 07/19/2019 1348   AMPHETMU NONE DETECTED 07/19/2019 1348   THCU POSITIVE (A) 07/19/2019 1348   LABBARB NONE DETECTED 07/19/2019 1348    Alcohol Level      Component Value Date/Time   ETH <10 07/19/2019 1040    IMAGING Ct Head Wo Contrast Ct Cervical Spine Wo Contrast  07/19/2019 1132 1. 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 2. No associated hemorrhage and mild intracranial mass effect at this time, including trace leftward midline shift. 3. Evidence of large vessel occlusion: Hyperdensity of the right ICA terminus, the right A1 and MCA. 4. Unaffected brain parenchyma appears negative. 5.  No acute traumatic injury identified in the cervical spine.   Mr Brain 90 Contrast Mr Angio Head Wo Contrast Mr Angio Neck Wo Contrast 07/19/2019 1330 1. The right ICA is  occluded from its origin. The right ICA terminus, right ACA and MCA also appear occluded. 2. 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 Cytotoxic edema but no convincing hemorrhage. Stable mild mass effect with trace leftward midline shift. 3. Intracranial MRA is degraded by motion artifact, but no other large vessel occlusion is suspected. There appears to be lobes reperfusion of the right PCA. 4. The left hemisphere and posterior fossa brain parenchyma appears normal.   Ct Head Wo Contrast 07/19/2019 1532 Similar appearance to earlier. Low-density and swelling of the right hemisphere in the ACA and MCA territories with mass effect and right-to-left shift of 5-6 mm. No hemorrhagic transformation at this time.   Ct Head Wo Contrast 07/20/2019 0444 1. Acute right ACA and MCA territory infarct with progressive swelling that has decompressed through the craniectomy defect. 2. No acute hemorrhage or new infarction.   Ct Angio Head W Or Wo Contrast Ct Angio Neck W Or Wo Contrast 07/21/2019 1. Stable from prior MRA. 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 No evidence of atherosclerosis or vasculopathy in the other vessels. 2. Cytotoxic edema causes 5 mm of  midline shift and brain bulging through the craniectomy defect. Mild petechial hemorrhage is seen at the basal ganglia.   2D Echocardiogram  1. The left ventricle has normal systolic function with an ejection fraction of 60-65%. The cavity size was normal. Left ventricular diastolic parameters were normal.  2. The right ventricle has normal systolic function. The cavity was normal. There is no increase in right ventricular wall thickness.  3. The pericardial effusion is circumferential.  4. Trivial pericardial effusion is present.  5. The mitral valve is grossly normal.  6. The tricuspid valve is grossly normal.  7. The aortic valve is tricuspid. No stenosis of the aortic valve.  8. The aorta is normal unless otherwise noted.  9. The aortic root is normal in size and structure. 10. No cardiac source of embolism identified. 11. When compared to the prior study: No comparison.  LE Dopplers pending   PHYSICAL EXAM   Temp:  [98.8 F (37.1 C)-101.8 F (38.8 C)] 100 F (37.8 C) (08/27 0800) Pulse Rate:  [81-100] 87 (08/27 0800) Resp:  [17-25] 18 (08/27 0800) BP: (100-161)/(58-109) 130/77 (08/27 0800) SpO2:  [100 %] 100 % (08/27 0800) Arterial Line BP: (57-177)/(48-110) 134/79 (08/27 0751) FiO2 (%):  [30 %-40 %] 30 % (08/27 0748) Weight:  [102.1 kg] 102.1 kg (08/27 0500)  General - Well nourished, well developed, intubated on sedation.  Ophthalmologic - fundi not visualized due to noncooperation.  Cardiovascular - Regular rate and rhythm.  Neuro - intubated on sedation, eyes close, not following commands. With forced eye opening, eyes in mid position, not blinking to visual threat, doll's eyes sluggish, not tracking, PERRL, brisk to light response. Corneal reflex present weakly bilaterally, gag and cough present. Breathing over the vent.  Facial symmetry not able to test due to ET tube.  Tongue protrusion not cooperative. Had intermittent spontaneous movement of RUE and RLE. On pain  stimulation, mild withdraw of RUE and RLE. But no movement of LUE and LLE. DTR 1+ and no babinski. Sensation, coordination and gait not tested.   ASSESSMENT/PLAN Mr. DERRYN APA is a 43 y.o. male with no significant past medical history found down x 2 days nonverbal with L hemiparesis.   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 Trace L midline shift. ELVO at R ICA, R A1, R MCA.  MRI  Large R brain infarct sparing R PCA. Cytotoxic edema but no hemorrhage. Stable trace midline shift.  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 R ICA terminus, R ACA, R MCA occluded.  CT head similar w/ low density and sweddling R ACA and MCA territories, now with 5-26mm midline shift. no hemorrhage  CT head acute R ACA and MCA infarct w/ progressive swelling decompressed through craniectomy defect.   CTA head and neck stable MLS. R ICA occlusion in neck that continues into R ACA and MCA. Mild petechial hemorrhage at basal ganglia  2D Echo EF 60-65%  LE venous doppler pending  May consider TEE later once stable  LDL 83  HgbA1c 5.4  UDS positive THC  Hypercoagulable and autoimmune work up pending  Heparin subq for VTE prophylaxis  No antithrombotic prior to admission, now on aspirin 325mg  daily.   Therapy recommendations:  pending   Disposition:  pending   Cyctotoxic cerebral edema  S/p R decompressive hemicraniectomy (Nundkumar) w/ flap R abd  On 3% saline @ 75  PICC placed  Na 146->148->154  Given One dose of 23.4% 8/26  Goal Na 150-160  ?? Right ICA dissection  MRA and CTA showed right ICA occlusion from origin to terminal  Wife denies any head trauma  Wife stated that pt had recent aggressive exercise with weight lifting  Concerning dissection as working diagnosis of  stroke etiology   On ASA   Acute Respiratory Failure   Intubated for airway protection  CCM on board   On vent  On sedation, propofol  Seizure-like activity  Continue Keppra   EEG continuous slowing, excessive beta activity, sleep spindle asymmetry decreased R->related to stroke and sedation. No SZ  LT EEG cortical dysfunction in right frontal region  Seizure precautions  Febrile, Leukocytosis  Tmax 103.1->101.4->101.8->100.4  WBC 16.7->pending  Vomited PTA, could be due to aspiration  CXR NAD, repeat CXR pending  Hyperlipidemia  Home meds:  no statin  LDL 83, goal < 70  Add lipitor 20 mg daily   Continue statin at discharge  Dysphagia . Secondary to stroke . NPO . On TF @ 20  Other Stroke Risk Factors  Former Cigarette smoker, quit 3 yrs ago  ETOH use  Substance abuse UDS - positive for THC  Obesity, Body mass index is 31.39 kg/m., recommend weight loss, diet and exercise as appropriate   Other Active Problems  At risk for rhabdo CK 7533. Hydrate as able. LDH 359. CRP 19.2.    Hypophosphatemia, hypomagnesemia - replaced    Hospital day # 2  This patient is critically ill due to large right MCA and ACA infarct, s/p hemicrani, seizure, cerebral edema and at significant risk of neurological worsening, death form brain herniation, malignant cerebral edema, status epilepticus, respiratory failure, sepsis. This patient's care requires constant monitoring of vital signs, hemodynamics, respiratory and cardiac monitoring, review of multiple databases, neurological assessment, discussion with family, other specialists and medical decision making of high complexity. I spent 40 minutes of neurocritical care time in the care of this patient. I had long discussion with wife over the phone, updated pt current condition, treatment plan and potential prognosis. They expressed understanding and appreciation.   Rosalin Hawking, MD PhD  Stroke Neurology 07/21/2019 8:25  AM   To contact Stroke Continuity provider, please refer to [email or web address] After hours, contact General Neurology

## 2019-07-21 NOTE — Op Note (Signed)
NEUROSURGERY OPERATIVE NOTE   PREOP DIAGNOSIS:  1. Right hemispheric stroke   POSTOP DIAGNOSIS: Same  PROCEDURE: 1. Decompressive right hemicraniectomy  SURGEON: Dr. Consuella Lose, MD  ASSISTANT: Ferne Reus, PA-C  ANESTHESIA: General Endotracheal  EBL: 200cc  SPECIMENS: None  DRAINS: None  COMPLICATIONS: None immediate  CONDITION: Hemodynamically stable to ICU  HISTORY: PAYCE DIETZLER is a 43 y.o. male initially presented to the hospital at least 1 day after being found down with left hemiplegia and mild aphasia.  He has slowly become more somnolent.  CT scan demonstrated essentially complete right hemispheric infarction including right ACA and MCA territory.  Given his young age and the size of his infarction, the risk for malignant cerebral edema and herniation syndrome was quite high.  After discussion with neurology and the patient's family, we did elect to proceed with decompressive hemicraniectomy.  Risks and benefits of the procedure were reviewed in detail with the patient's wife.  After all her questions were answered informed consent was obtained and witnessed.  PROCEDURE IN DETAIL: The patient was brought to the operating room. After induction of general anesthesia, the patient was positioned on the operative table in the supine position with a right-sided shoulder roll in order to expose the right frontotemporoparietal scalp.. All pressure points were meticulously padded.  Standard reverse question mark skin incision was then marked out and prepped and draped in the usual sterile fashion.  Skin of the abdomen was also prepped and draped.  After timeout was conducted, the incision was infiltrated with local anesthetic with epinephrine.  Incision was then made sharply and carried down through the galea.  Hemostasis on the skin edges was secured with Raney clips.  Bovie electrocautery was then used to incise the periosteum, temporalis fascia, and muscle, and a  single piece myocutaneous flap was elevated and retracted anteriorly.  Multiple bur holes were then created and connected with the craniotome to elevate a right-sided hemicraniectomy flap.  This was then stored on the back table.  Hemostasis on the epidural surface was secured using bipolar electrocautery, and morselized Gelfoam with thrombin.  The dura overlying the frontotemporoparietal region was then opened in stellate fashion.  There was a fair amount of underlying brain edema, although the brain did appear dusky, and minimally perfused.  At this point, a right sided transverse abdominal incision was made and carried down through the subcutaneous tissue.  Hemostasis was secured with bipolar electrocautery.  A subcutaneous pocket was then created.  The bone flap was then placed into the pocket overlying the rectus fascia.  The wound was then irrigated with normal saline irrigation.  The abdominal wound was then closed in 2 layers using interrupted 0 and 3-0 Vicryl stitches.  The skin was closed with standard surgical skin staples.  Returning attention to the cranial wound, hemostasis was again confirmed with morselized Gelfoam with thrombin and bipolar electrocautery.  A large sheet of collagen dural substitute was placed as an onlay graft, with small pieces of bovine pericardium placed above this in order to provide a cleavage plane during return for cranioplasty.  The wound was then irrigated with normal saline irrigation.  The temporalis muscle and fascia was reapproximated with 0 Vicryl stitches, and the galea was reapproximated with interrupted 0 and 3-0 Vicryl stitches.  Skin was closed with staples.  Bacitracin ointment and sterile dressings were then applied to both incisions.  At the end of the case all sponge, needle, and instrument counts were correct. The patient was then  transferred to the stretcher, extubated, and taken to the post-anesthesia care unit in stable hemodynamic condition.

## 2019-07-21 NOTE — Progress Notes (Signed)
VASCULAR LAB PRELIMINARY  PRELIMINARY  PRELIMINARY  PRELIMINARY  Bilateral lower extremity venous duplex completed.    Preliminary report:  See CV proc for preliminary results.  Gave report to Anderson Malta, RN and texted Dr. Jorene Guest, [facility], RVT 07/21/2019, 3:17 PM

## 2019-07-21 NOTE — Procedures (Signed)
Patient Name: Matthew Black  MRN: [id number]  Epilepsy Attending: Lora Havens  Referring Physician/Provider: Dr Rosalin Hawking Duration: 07/20/2019 1244 to 07/21/2019 1237  Patient history: 43yo M with right MCA/ACA acute infarct s/p craniotomy. He had 30 sec episode of whole body shaking for seizure. EEG to evaluate for seizure.  Level of alertness: sedated  AEDs during EEG study: keppra, versed, propofol  Technical aspects: This EEG study was done with scalp electrodes positioned according to the 10-20 International system of electrode placement. Electrical activity was acquired at a sampling rate of 500Hz  and reviewed with a high frequency filter of 70Hz  and a low frequency filter of 1Hz . EEG data were recorded continuously and digitally stored.   DESCRIPTION:  EEG showed continuous generalized 2-3 Hz delta slowing, maximal right frontal region. Sleep spindles were asymmetric, seen in frontocentral region, decreased on right. Hyperventilation and photic stimulation were not performed.  Theree events were recorded as follows 1. 07/21/2019 1741: Patient appears to have right leg and arm movements and appears restless. 2. 07/21/2019 2131: Bilateral shoulder jerks and non rhythmic movement in bed 3. 07/22/2019 0042: shoulder jerks and moving in bed  Concomitant EEG did not show any seizure activity before, during and after all these events.    ABNORMALITY: 1. Continuous slowing, generalized, maximal right frontal region 2. Sleep spindle asymmetry, decreased right  IMPRESSION: This study is suggestive of cortical dysfunction in right frontal region, likely secondary to underlying stroke and cerebral edema. There is also severe diffuse encephalopathy, likely due to sedation. No seizures or epileptiform discharges were seen throughout the recording.   Three events were captured as described above during which patient appeared to have non rhythmic movements more pronounced on right side.  Concomitant EEG did not show any seizures and therefore, these events are likely non epileptic.

## 2019-07-21 NOTE — Progress Notes (Signed)
OT Cancellation Note  Patient Details Name: Matthew Black MRN: [id number] DOB: 12-30-1975   Cancelled Treatment:    Reason Eval/Treat Not Completed: Patient not medically ready.  Per MD, likely will not be ready for a couple of more days.  Will check back once medically stable.  Lucille Passy, OTR/L Acute Rehabilitation Services Pager [phone] Office [phone]   Lucille Passy M 07/21/2019, 10:04 AM

## 2019-07-21 NOTE — Progress Notes (Signed)
  NEUROSURGERY PROGRESS NOTE   No issues overnight.  On propofol. When lifted, becomes agitated  EXAM:  BP 120/61   Pulse 80   Temp 100.2 F (37.9 C)   Resp 15   Ht 5\' 11"  (1.803 m)   Wt 102.1 kg   SpO2 100%   BMI 31.39 kg/m   Intubated, Sedated PERRL, blinks to threat right Crani incision: minimal dried blood on bandage, no active bleeding Abd incision: minimal blood on bandage, no active bleeding. Soft.   IMPRESSION/PLAN 43 y.o. male POD #2 right hemicraniectomy for right MCA/ACA infarct. Remains intubated. Requires sedation due to agitation. - Continue current care per PCCM and Neuro - No new NS recs

## 2019-07-21 NOTE — Procedures (Signed)
Interventional Radiology Procedure:   Indications: CVA with bilateral lower extremity DVTs.  Not a candidate for anticoagulation.  Procedure: Placement of IVC filter  Findings: Patent IVC.  Denali retrievable filter placed below renal veins  Complications: None     EBL: less than 10 ml  Plan: Return to inpatient floor.    Evey Mcmahan R. Anselm Pancoast, MD  Pager: [phone]

## 2019-07-21 NOTE — Progress Notes (Addendum)
Chief Complaint: Patient was seen in consultation today for LE DVT at the request of Dr. Audria Nine  Referring Physician(s): Dr. Audria Nine  Supervising Physician: Markus Daft  Patient Status: [facility] - In-pt  History of Present Illness: Matthew Black is a 43 y.o. 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 He is s/p (R) decompressive hemicraniectomy on 8/25. He is currently intubated and sedated on the vent. He is now found to have (B)LE DVT by [place]. He is unable to be placed on anticoagulation secondary to his recent CVA and craniectomy. IR is asked to place retrievable IVC filter as means to prevent possibly catastrophic PE. Chart, imaging, labs, meds reviewed.  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    Past Surgical History:  Procedure Laterality Date   CRANIOTOMY Right 07/19/2019   Procedure: RIGHT HEMI-CRANIECTOMY With implantation of skull flap to abdominal wall;  Surgeon: Consuella Lose, MD;  Location: [place];  Service: Neurosurgery;  Laterality: Right;   CYST EXCISION N/A 10/08/2016   Procedure: EXCISION OF POSTERIOR NECK CYST;  Surgeon: Clovis Riley, MD;  Location: [facility];  Service: General;  Laterality: N/A;   INCISION AND DRAINAGE ABSCESS N/A 09/22/2014   Procedure: INCISION AND DRAINAGE ABSCESS POSTERIOR NECK;  Surgeon: Pedro Earls, MD;  Location: [facility];  Service: General;  Laterality: N/A;   INCISION AND DRAINAGE ABSCESS N/A 12/20/2015   Procedure: INCISION AND DRAINAGE POSTERIOR NECK MASS;  Surgeon: Armandina Gemma, MD;  Location: [facility];  Service: General;  Laterality: N/A;   INCISION AND DRAINAGE ABSCESS Left 07/10/2004   middle finger   MASS EXCISION N/A 07/21/2017   Procedure: EXCISION OF BENIGN NECK LESION WITH LAYERED CLOSURE;  Surgeon: Irene Limbo, MD;  Location: [place];  Service: Plastics;  Laterality: N/A;   MASS EXCISION N/A 11/10/2017   Procedure:  EXCISION BENIGN LESION OF THE NECK WITH LAYERED CLOSURE;  Surgeon: Irene Limbo, MD;  Location: [place];  Service: Plastics;  Laterality: N/A;    Allergies: Hydrocodone  Medications:  Current Facility-Administered Medications:    acetaminophen (TYLENOL) tablet 650 mg, 650 mg, Oral, Q4H PRN **OR** acetaminophen (TYLENOL) solution 650 mg, 650 mg, Per Tube, Q4H PRN, 650 mg at 07/21/19 0914 **OR** acetaminophen (TYLENOL) suppository 650 mg, 650 mg, Rectal, Q4H PRN, Kerney Elbe, MD, 650 mg at 07/19/19 1644   aspirin tablet 325 mg, 325 mg, Per Tube, Daily, Rosalin Hawking, MD, 325 mg at 07/21/19 1057   atorvastatin (LIPITOR) tablet 20 mg, 20 mg, Oral, q1800, Rosalin Hawking, MD, 20 mg at 07/20/19 1759   chlorhexidine gluconate (MEDLINE KIT) (PERIDEX) 0.12 % solution 15 mL, 15 mL, Mouth Rinse, BID, Costella, Vincent J, PA-C, 15 mL at 07/21/19 0751   chlorhexidine gluconate (MEDLINE KIT) (PERIDEX) 0.12 % solution 15 mL, 15 mL, Mouth Rinse, BID, Kerney Elbe, MD, 15 mL at 07/20/19 0757   Chlorhexidine Gluconate Cloth 2 % PADS 6 each, 6 each, Topical, Daily, Rosalin Hawking, MD, 6 each at 07/21/19 0000   famotidine (PEPCID) IVPB 20 mg premix, 20 mg, Intravenous, Q12H, Ogan, [place]wo U, MD, Stopped at 07/21/19 0944   feeding supplement (PRO-STAT SUGAR FREE 64) liquid 60 mL, 60 mL, Per Tube, QID, Salvadore Dom E, NP, 60 mL at 07/21/19 1400   feeding supplement (VITAL HIGH PROTEIN) liquid 1,000 mL, 1,000 mL, Per Tube, Q24H, Rosalin Hawking, MD, 1,000 mL at 07/21/19 1230   fentaNYL (SUBLIMAZE) injection 25 mcg, 25 mcg,  Intravenous, Q2H PRN, Audria Nine, DO, 25 mcg at 07/21/19 1458   heparin injection 5,000 Units, 5,000 Units, Subcutaneous, Q8H, Rosalin Hawking, MD, 5,000 Units at 07/21/19 1400   labetalol (NORMODYNE) injection 5-20 mg, 5-20 mg, Intravenous, Q2H PRN, Rosalin Hawking, MD   levETIRAcetam (KEPPRA) IVPB 500 mg/100 mL premix, 500 mg, Intravenous, Q12H, Rosalin Hawking, MD,  Stopped at 07/21/19 1005   lidocaine (XYLOCAINE) 1 % (with pres) injection, , , ,    MEDLINE mouth rinse, 15 mL, Mouth Rinse, 10 times per day, Kerney Elbe, MD, 15 mL at 07/21/19 1346   midazolam (VERSED) injection 1-2 mg, 1-2 mg, Intravenous, Q1H PRN, Frederik Pear, MD, 2 mg at 07/21/19 0520   propofol (DIPRIVAN) 1000 MG/100ML infusion, 5-80 mcg/kg/min, Intravenous, Titrated, Amie Portland, MD, Last Rate: 18 mL/hr at 07/21/19 1500, 30 mcg/kg/min at 07/21/19 1500   senna-docusate (Senokot-S) tablet 1 tablet, 1 tablet, Oral, QHS PRN, Kerney Elbe, MD   sodium chloride (hypertonic) 3 % solution, , Intravenous, Continuous, Rosalin Hawking, MD, Last Rate: 75 mL/hr at 07/21/19 1500   sodium chloride flush (NS) 0.9 % injection 10-40 mL, 10-40 mL, Intracatheter, Q12H, Rosalin Hawking, MD, 10 mL at 07/20/19 2113   sodium chloride flush (NS) 0.9 % injection 10-40 mL, 10-40 mL, Intracatheter, PRN, Rosalin Hawking, MD   sodium phosphate 10 mmol in dextrose 5 % 250 mL infusion, 10 mmol, Intravenous, Once, Audria Nine, DO, Last Rate: 42 mL/hr at 07/21/19 1500    Family History  Problem Relation Age of Onset   Diabetes Other        GF   Healthy Mother    Healthy Father     Social History   Socioeconomic History   Marital status: Married    Spouse name: Not on file   Number of children: 2   Years of education: Not on file   Highest education level: Not on file  Occupational History   Occupation: Sport and exercise psychologist strain: Not on file   Food insecurity    Worry: Not on file    Inability: Not on file   Transportation needs    Medical: Not on file    Non-medical: Not on file  Tobacco Use   Smoking status: Former Smoker    Packs/day: 0.00    Quit date: 11/24/2015    Years since quitting: 3.6   Smokeless tobacco: Never Used   Tobacco comment:    Substance and Sexual Activity   Alcohol use: Yes    Comment: occasionally   Drug  use: Never   Sexual activity: Not on file  Lifestyle   Physical activity    Days per week: Not on file    Minutes per session: Not on file   Stress: Not on file  Relationships   Social connections    Talks on phone: Not on file    Gets together: Not on file    Attends religious service: Not on file    Active member of club or organization: Not on file    Attends meetings of clubs or organizations: Not on file    Relationship status: Not on file  Other Topics Concern   Not on file  Social History Narrative   Household-- pt, wife, 2 children     Review of Systems: A 12 point ROS discussed and pertinent positives are indicated in the HPI above.  All other systems are negative.  Review of Systems  Vital Signs: BP 120/61  Pulse 80    Temp 100.2 F (37.9 C)    Resp 15    Ht _0  (1.803 m)    Wt 102.1 kg    SpO2 100%    BMI 31.39 kg/m   Physical Exam Intubated sedated.   Imaging: Ct Angio Head W Or Wo Contrast  Result Date: 07/21/2019 CLINICAL DATA:  Follow-up stroke EXAM: CT ANGIOGRAPHY HEAD AND NECK TECHNIQUE: Multidetector CT imaging of the head and neck was performed using the standard protocol during bolus administration of intravenous contrast. Multiplanar CT image reconstructions and MIPs were obtained to evaluate the vascular anatomy. Carotid stenosis measurements (when applicable) are obtained utilizing NASCET criteria, using the distal internal carotid diameter as the denominator. CONTRAST:  22m OMNIPAQUE IOHEXOL 350 MG/ML SOLN COMPARISON:  Head CT from yesterday FINDINGS: CT HEAD FINDINGS Brain: Acute infarct in the right MCA and ACA territories with petechial hemorrhage at the basal ganglia. Swelling causes brain herniation through the craniectomy defect. 5 mm of midline shift. Vascular: Hyperdense right MCA and proximal ACA. Skull: Unremarkable right-sided craniotomy. Sinuses: Negative Orbits: Negative Review of the MIP images confirms the above findings CTA NECK  FINDINGS Aortic arch: Normal where covered Right carotid system: Proximal right ICA occlusion. The right occipital artery appears to arise from the carotid bulb. No proximal occlusion or embolic source is seen. No atheromatous changes. Left carotid system: Vessels are smooth and widely patent Vertebral arteries: Vessels are smooth and widely patent Skeleton: Negative Other neck: Unremarkable hardware positioning Upper chest: Negative Review of the MIP images confirms the above findings CTA HEAD FINDINGS Anterior circulation: Right ICA occlusion in the neck continues into the siphon and into the right MCA branches and right ACA territory. No left-sided embolus or stenosis noted. Suspect a small persistent trigeminal artery on the left. Posterior circulation: Vertebral and basilar arteries are smooth and diffusely patent. Negative for branch occlusion or stenosis. Venous sinuses: Patent Anatomic variants: As above Review of the MIP images confirms the above findings IMPRESSION: 1. Stable from prior MRA. 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 No evidence of atherosclerosis or vasculopathy in the other vessels. 2. Cytotoxic edema causes 5 mm of midline shift and brain bulging through the craniectomy defect. Mild petechial hemorrhage is seen at the basal ganglia. Electronically Signed   By: JMonte FantasiaM.D.   On: 07/21/2019 07:18   Dg Chest 2 View  Result Date: 07/19/2019 CLINICAL DATA:  Found on floor. EXAM: CHEST - 2 VIEW COMPARISON:  09/21/2014 FINDINGS: Heart and mediastinal contours are within normal limits. No focal opacities or effusions. No acute bony abnormality. IMPRESSION: No active cardiopulmonary disease. Electronically Signed   By: KRolm BaptiseM.D.   On: 07/19/2019 15:50   Ct Head Wo Contrast  Result Date: 07/20/2019 CLINICAL DATA:  Stroke follow-up EXAM: CT HEAD WITHOUT CONTRAST TECHNIQUE: Contiguous axial images were obtained from the base  of the skull through the vertex without intravenous contrast. COMPARISON:  Yesterday FINDINGS: Brain: Acute right ACA and MCA territory infarcts with cytotoxic edema. Interval decompressive craniectomy with bulging of the swollen brain through the defect. No midline shift, entrapment, or hemorrhage. Vascular: High-density vessels on the right attributed to adjacent edema and/or thrombosis. Skull: Right craniectomy which is unremarkable. Sinuses/Orbits: Negative IMPRESSION: 1. Acute right ACA and MCA territory infarct with progressive swelling that has decompressed through the craniectomy defect. 2. No acute hemorrhage or new infarction. Electronically Signed   By: JAngelica Chessman  Watts M.D.   On: 07/20/2019 07:09   Ct Head Wo Contrast  Result Date: 07/19/2019 CLINICAL DATA:  Follow-up stroke hemiparesis and aphasia. EXAM: CT HEAD WITHOUT CONTRAST TECHNIQUE: Contiguous axial images were obtained from the base of the skull through the vertex without intravenous contrast. COMPARISON:  CT and MRI studies same day FINDINGS: Brain: Progressive low-density and swelling within the knee complete right MCA and ACA territory affecting the right basal ganglia, frontal lobe, anterior temporal lobe and parietal lobe. Sparing only of the portion of the supratentorial brain supplied by the right PCA. Brain swelling but no hemorrhagic transformation. Mass effect with right-to-left shift of 5-6 mm. Hyperdense carotid terminus and right ACA MCA consistent with embolic occlusion. No extra-axial collection. No hydrocephalus. Vascular: Hyperdense right carotid terminus and right ACA MCA. Skull: Normal Sinuses/Orbits: Negative Other: None IMPRESSION: Similar appearance to earlier. Low-density and swelling of the right hemisphere in the ACA and MCA territories with mass effect and right-to-left shift of 5-6 mm. No hemorrhagic transformation at this time. Electronically Signed   By: Nelson Chimes M.D.   On: 07/19/2019 16:01   Ct Head Wo  Contrast  Result Date: 07/19/2019 CLINICAL DATA:  43 year old male with neurologic deficit greater than 6 hours. Hemiparesis and aphasia. EXAM: CT HEAD WITHOUT CONTRAST CT CERVICAL SPINE WITHOUT CONTRAST TECHNIQUE: Multidetector CT imaging of the head and cervical spine was performed following the standard protocol without intravenous contrast. Multiplanar CT image reconstructions of the cervical spine were also generated. COMPARISON:  Report of neck CT 09/21/2014 (no images available). Report of head CT 07/23/2001 (no images available). FINDINGS: CT HEAD FINDINGS Brain: Confluent cytotoxic edema in the right hemisphere affecting the right ACA and right MCA territories. Mass effect on the right lateral ventricle and trace leftward midline shift (series 3, image 21). The right PCA territory appears mostly spared. There is right deep gray nuclei involvement. The left hemisphere and posterior fossa gray-white matter differentiation is preserved. No ventriculomegaly. Basilar cisterns remain patent. No acute intracranial hemorrhage identified. Vascular: Asymmetric hyperdensity at the right ICA terminus and involving the right A1, right M1 segments and also some right MCA branches (series 5, image 32). Skull: Negative. Sinuses/Orbits: Mild mucosal thickening in the left maxillary sinus. Other paranasal sinuses, tympanic cavities and mastoids are clear. Other: No acute orbit or scalp soft tissue finding. CT CERVICAL SPINE FINDINGS Alignment: Straightening of cervical lordosis. Cervicothoracic junction alignment is within normal limits. Bilateral posterior element alignment is within normal limits. Skull base and vertebrae: Visualized skull base is intact. No atlanto-occipital dissociation. No acute osseous abnormality identified. Soft tissues and spinal canal: No prevertebral fluid or swelling. No visible canal hematoma. Negative noncontrast neck soft tissues. Disc levels: Mild cervical spine degeneration. Foraminal disc  and endplate spurring most pronounced at C3-C4. Upper chest: Visible upper thoracic levels and left lung apex appear negative. IMPRESSION: 1. 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 2. No associated hemorrhage and mild intracranial mass effect at this time, including trace leftward midline shift. 3. Evidence of large vessel occlusion: Hyperdensity of the right ICA terminus, the right A1 and MCA. 4. Unaffected brain parenchyma appears negative. 5.  No acute traumatic injury identified in the cervical spine. Critical Value/emergent results were called by telephone at the time of interpretation on 07/19/2019 at 11:45 am to Dr. Virgel Manifold , who verbally acknowledged these results. Electronically Signed   By: Genevie Ann M.D.   On: 07/19/2019 11:46  Ct Angio Neck W Or Wo Contrast  Result Date: 07/21/2019 CLINICAL DATA:  Follow-up stroke EXAM: CT ANGIOGRAPHY HEAD AND NECK TECHNIQUE: Multidetector CT imaging of the head and neck was performed using the standard protocol during bolus administration of intravenous contrast. Multiplanar CT image reconstructions and MIPs were obtained to evaluate the vascular anatomy. Carotid stenosis measurements (when applicable) are obtained utilizing NASCET criteria, using the distal internal carotid diameter as the denominator. CONTRAST:  37m OMNIPAQUE IOHEXOL 350 MG/ML SOLN COMPARISON:  Head CT from yesterday FINDINGS: CT HEAD FINDINGS Brain: Acute infarct in the right MCA and ACA territories with petechial hemorrhage at the basal ganglia. Swelling causes brain herniation through the craniectomy defect. 5 mm of midline shift. Vascular: Hyperdense right MCA and proximal ACA. Skull: Unremarkable right-sided craniotomy. Sinuses: Negative Orbits: Negative Review of the MIP images confirms the above findings CTA NECK FINDINGS Aortic arch: Normal where covered Right carotid system: Proximal right ICA occlusion. The right occipital  artery appears to arise from the carotid bulb. No proximal occlusion or embolic source is seen. No atheromatous changes. Left carotid system: Vessels are smooth and widely patent Vertebral arteries: Vessels are smooth and widely patent Skeleton: Negative Other neck: Unremarkable hardware positioning Upper chest: Negative Review of the MIP images confirms the above findings CTA HEAD FINDINGS Anterior circulation: Right ICA occlusion in the neck continues into the siphon and into the right MCA branches and right ACA territory. No left-sided embolus or stenosis noted. Suspect a small persistent trigeminal artery on the left. Posterior circulation: Vertebral and basilar arteries are smooth and diffusely patent. Negative for branch occlusion or stenosis. Venous sinuses: Patent Anatomic variants: As above Review of the MIP images confirms the above findings IMPRESSION: 1. Stable from prior MRA. 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 No evidence of atherosclerosis or vasculopathy in the other vessels. 2. Cytotoxic edema causes 5 mm of midline shift and brain bulging through the craniectomy defect. Mild petechial hemorrhage is seen at the basal ganglia. Electronically Signed   By: JMonte FantasiaM.D.   On: 07/21/2019 07:18   Ct Cervical Spine Wo Contrast  Result Date: 07/19/2019 CLINICAL DATA:  43year old male with neurologic deficit greater than 6 hours. Hemiparesis and aphasia. EXAM: CT HEAD WITHOUT CONTRAST CT CERVICAL SPINE WITHOUT CONTRAST TECHNIQUE: Multidetector CT imaging of the head and cervical spine was performed following the standard protocol without intravenous contrast. Multiplanar CT image reconstructions of the cervical spine were also generated. COMPARISON:  Report of neck CT 09/21/2014 (no images available). Report of head CT 07/23/2001 (no images available). FINDINGS: CT HEAD FINDINGS Brain: Confluent cytotoxic edema in the right hemisphere  affecting the right ACA and right MCA territories. Mass effect on the right lateral ventricle and trace leftward midline shift (series 3, image 21). The right PCA territory appears mostly spared. There is right deep gray nuclei involvement. The left hemisphere and posterior fossa gray-white matter differentiation is preserved. No ventriculomegaly. Basilar cisterns remain patent. No acute intracranial hemorrhage identified. Vascular: Asymmetric hyperdensity at the right ICA terminus and involving the right A1, right M1 segments and also some right MCA branches (series 5, image 32). Skull: Negative. Sinuses/Orbits: Mild mucosal thickening in the left maxillary sinus. Other paranasal sinuses, tympanic cavities and mastoids are clear. Other: No acute orbit or scalp soft tissue finding. CT CERVICAL SPINE FINDINGS Alignment: Straightening of cervical lordosis. Cervicothoracic junction alignment is within normal limits. Bilateral posterior element alignment is within  normal limits. Skull base and vertebrae: Visualized skull base is intact. No atlanto-occipital dissociation. No acute osseous abnormality identified. Soft tissues and spinal canal: No prevertebral fluid or swelling. No visible canal hematoma. Negative noncontrast neck soft tissues. Disc levels: Mild cervical spine degeneration. Foraminal disc and endplate spurring most pronounced at C3-C4. Upper chest: Visible upper thoracic levels and left lung apex appear negative. IMPRESSION: 1. 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 2. No associated hemorrhage and mild intracranial mass effect at this time, including trace leftward midline shift. 3. Evidence of large vessel occlusion: Hyperdensity of the right ICA terminus, the right A1 and MCA. 4. Unaffected brain parenchyma appears negative. 5.  No acute traumatic injury identified in the cervical spine. Critical Value/emergent results were called by telephone  at the time of interpretation on 07/19/2019 at 11:45 am to Dr. Virgel Manifold , who verbally acknowledged these results. Electronically Signed   By: Genevie Ann M.D.   On: 07/19/2019 11:46   Mr Angio Head Wo Contrast  Result Date: 07/19/2019 CLINICAL DATA:  43 year old male found down this morning by coworker. 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 EXAM: MRI HEAD WITHOUT CONTRAST MRA HEAD WITHOUT CONTRAST MRA NECK WITHOUT CONTRAST TECHNIQUE: Multiplanar, multiecho pulse sequences of the brain and surrounding structures were obtained without intravenous contrast. Angiographic images of the Circle of Willis were obtained using MRA technique without intravenous contrast. Angiographic images of the neck were obtained using MRA technique without intravenous contrast. Carotid stenosis measurements (when applicable) are obtained utilizing NASCET criteria, using the distal internal carotid diameter as the denominator. COMPARISON:  Plain head CT 1128 hours today. FINDINGS: MRI HEAD FINDINGS Brain: Confluent restricted diffusion throughout the right hemisphere sparing most of the right PCA territory; there are few punctate areas of restricted diffusion in the right occipital pole (series 5, image 70). The right basal ganglia and internal capsule are affected. The right thalamus is spared. No left hemisphere or posterior fossa restricted diffusion. Associated T2 and FLAIR hyperintensity reflecting cytotoxic edema. No convincing hemorrhage; mild asymmetry of susceptibility weighted images felt related to some right hemisphere vascular engorgement. Trace leftward midline shift and mild mass effect on the right lateral ventricle. Incidental cavum septum pellucidum. Minimal mass effect on the suprasellar cistern. Other basilar cisterns are patent. No ventriculomegaly. Outside of the affected territory gray and white matter signal is normal.  Negative pituitary. Vascular: The right ICA flow void is lost in the neck and through the siphon. The other Major intracranial vascular flow voids are preserved. See below. Skull and upper cervical spine: Negative visible cervical spine. Normal bone marrow signal. Sinuses/Orbits: Negative orbits. Trace paranasal sinus mucosal thickening. Other: Mastoids are clear. Scalp and face soft tissues appear negative. MRA NECK FINDINGS Time-of-flight images in the neck demonstrate antegrade flow signal in both common carotid arteries and both cervical vertebral arteries. From the level of the right carotid bifurcation there is absent flow signal in the right ICA. Preserved flow signal in the right ECA. The left carotid bifurcation and left ICA appear normal. Antegrade flow continues in the vertebral arteries to the skull base. MRA HEAD FINDINGS Antegrade flow in the posterior circulation with codominant distal vertebral arteries. Patent vertebrobasilar junction. Patent basilar artery. Motion artifact at the distal basilar which seems to remain patent. The basilar tip and PCA origins appear to remain patent, but detail is  obscured. Above the level of artifact there is flow signal detected in both PCAs, greater on the right (likely due to luxury perfusion on that side). At the skull base there is antegrade flow in the left ICA siphon, but no right ICA flow signal. Motion artifact beginning at the cavernous segment, but antegrade flow continues on the left to the level of the left ICA terminus. The left MCA and ACA origin are patent. Visible left MCA and ACA branches appear within normal limits. There is no flow signal at the right ICA terminus, right ACA or right MCA. IMPRESSION: 1. 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 The right ICA terminus, right ACA and MCA also appear occluded. 2. 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 Cytotoxic edema but no convincing hemorrhage. Stable mild mass  effect with trace leftward midline shift. 3. Intracranial MRA is degraded by motion artifact, but no other large vessel occlusion is suspected. There appears to be lobes reperfusion of the right PCA. 4. The left hemisphere and posterior fossa brain parenchyma appears normal. Electronically Signed   By: Genevie Ann M.D.   On: 07/19/2019 14:00   Mr Angio Neck Wo Contrast  Result Date: 07/19/2019 CLINICAL DATA:  42 year old male found down this morning by coworker. 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 EXAM: MRI HEAD WITHOUT CONTRAST MRA HEAD WITHOUT CONTRAST MRA NECK WITHOUT CONTRAST TECHNIQUE: Multiplanar, multiecho pulse sequences of the brain and surrounding structures were obtained without intravenous contrast. Angiographic images of the Circle of Willis were obtained using MRA technique without intravenous contrast. Angiographic images of the neck were obtained using MRA technique without intravenous contrast. Carotid stenosis measurements (when applicable) are obtained utilizing NASCET criteria, using the distal internal carotid diameter as the denominator. COMPARISON:  Plain head CT 1128 hours today. FINDINGS: MRI HEAD FINDINGS Brain: Confluent restricted diffusion throughout the right hemisphere sparing most of the right PCA territory; there are few punctate areas of restricted diffusion in the right occipital pole (series 5, image 70). The right basal ganglia and internal capsule are affected. The right thalamus is spared. No left hemisphere or posterior fossa restricted diffusion. Associated T2 and FLAIR hyperintensity reflecting cytotoxic edema. No convincing hemorrhage; mild asymmetry of susceptibility weighted images felt related to some right hemisphere vascular engorgement. Trace leftward midline shift and mild mass effect on the right lateral ventricle. Incidental cavum septum pellucidum. Minimal mass effect  on the suprasellar cistern. Other basilar cisterns are patent. No ventriculomegaly. Outside of the affected territory gray and white matter signal is normal. Negative pituitary. Vascular: The right ICA flow void is lost in the neck and through the siphon. The other Major intracranial vascular flow voids are preserved. See below. Skull and upper cervical spine: Negative visible cervical spine. Normal bone marrow signal. Sinuses/Orbits: Negative orbits. Trace paranasal sinus mucosal thickening. Other: Mastoids are clear. Scalp and face soft tissues appear negative. MRA NECK FINDINGS Time-of-flight images in the neck demonstrate antegrade flow signal in both common carotid arteries and both cervical vertebral arteries. From the level of the right carotid bifurcation there is absent flow signal in the right ICA. Preserved flow signal in the right ECA. The left carotid bifurcation and left ICA appear normal. Antegrade flow continues in the vertebral arteries to the skull base. MRA HEAD FINDINGS Antegrade flow in the posterior circulation with codominant distal vertebral arteries. Patent vertebrobasilar junction. Patent basilar artery.  Motion artifact at the distal basilar which seems to remain patent. The basilar tip and PCA origins appear to remain patent, but detail is obscured. Above the level of artifact there is flow signal detected in both PCAs, greater on the right (likely due to luxury perfusion on that side). At the skull base there is antegrade flow in the left ICA siphon, but no right ICA flow signal. Motion artifact beginning at the cavernous segment, but antegrade flow continues on the left to the level of the left ICA terminus. The left MCA and ACA origin are patent. Visible left MCA and ACA branches appear within normal limits. There is no flow signal at the right ICA terminus, right ACA or right MCA. IMPRESSION: 1. 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 The right ICA terminus, right ACA and MCA also  appear occluded. 2. 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 Cytotoxic edema but no convincing hemorrhage. Stable mild mass effect with trace leftward midline shift. 3. Intracranial MRA is degraded by motion artifact, but no other large vessel occlusion is suspected. There appears to be lobes reperfusion of the right PCA. 4. The left hemisphere and posterior fossa brain parenchyma appears normal. Electronically Signed   By: Genevie Ann M.D.   On: 07/19/2019 14:00   Mr Brain Wo Contrast  Result Date: 07/19/2019 CLINICAL DATA:  43 year old male found down this morning by coworker. 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 EXAM: MRI HEAD WITHOUT CONTRAST MRA HEAD WITHOUT CONTRAST MRA NECK WITHOUT CONTRAST TECHNIQUE: Multiplanar, multiecho pulse sequences of the brain and surrounding structures were obtained without intravenous contrast. Angiographic images of the Circle of Willis were obtained using MRA technique without intravenous contrast. Angiographic images of the neck were obtained using MRA technique without intravenous contrast. Carotid stenosis measurements (when applicable) are obtained utilizing NASCET criteria, using the distal internal carotid diameter as the denominator. COMPARISON:  Plain head CT 1128 hours today. FINDINGS: MRI HEAD FINDINGS Brain: Confluent restricted diffusion throughout the right hemisphere sparing most of the right PCA territory; there are few punctate areas of restricted diffusion in the right occipital pole (series 5, image 70). The right basal ganglia and internal capsule are affected. The right thalamus is spared. No left hemisphere or posterior fossa restricted diffusion. Associated T2 and FLAIR hyperintensity reflecting cytotoxic edema. No convincing hemorrhage; mild asymmetry of susceptibility weighted images felt related to some right hemisphere vascular  engorgement. Trace leftward midline shift and mild mass effect on the right lateral ventricle. Incidental cavum septum pellucidum. Minimal mass effect on the suprasellar cistern. Other basilar cisterns are patent. No ventriculomegaly. Outside of the affected territory gray and white matter signal is normal. Negative pituitary. Vascular: The right ICA flow void is lost in the neck and through the siphon. The other Major intracranial vascular flow voids are preserved. See below. Skull and upper cervical spine: Negative visible cervical spine. Normal bone marrow signal. Sinuses/Orbits: Negative orbits. Trace paranasal sinus mucosal thickening. Other: Mastoids are clear. Scalp and face soft tissues appear negative. MRA NECK FINDINGS Time-of-flight images in the neck demonstrate antegrade flow signal in both common carotid arteries and both cervical vertebral arteries. From the level of the right carotid bifurcation there is absent flow signal in the right ICA. Preserved flow signal in the right ECA. The left carotid bifurcation and left ICA appear normal. Antegrade flow continues in the vertebral arteries to  the skull base. MRA HEAD FINDINGS Antegrade flow in the posterior circulation with codominant distal vertebral arteries. Patent vertebrobasilar junction. Patent basilar artery. Motion artifact at the distal basilar which seems to remain patent. The basilar tip and PCA origins appear to remain patent, but detail is obscured. Above the level of artifact there is flow signal detected in both PCAs, greater on the right (likely due to luxury perfusion on that side). At the skull base there is antegrade flow in the left ICA siphon, but no right ICA flow signal. Motion artifact beginning at the cavernous segment, but antegrade flow continues on the left to the level of the left ICA terminus. The left MCA and ACA origin are patent. Visible left MCA and ACA branches appear within normal limits. There is no flow signal at the  right ICA terminus, right ACA or right MCA. IMPRESSION: 1. 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 The right ICA terminus, right ACA and MCA also appear occluded. 2. 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 Cytotoxic edema but no convincing hemorrhage. Stable mild mass effect with trace leftward midline shift. 3. Intracranial MRA is degraded by motion artifact, but no other large vessel occlusion is suspected. There appears to be lobes reperfusion of the right PCA. 4. The left hemisphere and posterior fossa brain parenchyma appears normal. Electronically Signed   By: Genevie Ann M.D.   On: 07/19/2019 14:00   Dg Chest Port 1 View  Result Date: 07/21/2019 CLINICAL DATA:  New fever EXAM: PORTABLE CHEST 1 VIEW COMPARISON:  07/20/2019 FINDINGS: Temperature probe has been placed over the midesophagus. Otherwise unchanged AP portable examination with endotracheal tube, tip just below the thoracic inlet and esophagogastric tube tip and side port below the diaphragm. No new airspace opacity. Thermal blanket projects over the patient. IMPRESSION: Temperature probe has been placed over the midesophagus. Otherwise unchanged AP portable examination with endotracheal tube, tip just below the thoracic inlet and esophagogastric tube tip and side port below the diaphragm. No new airspace opacity. Thermal blanket projects over the patient. Electronically Signed   By: Eddie Candle M.D.   On: 07/21/2019 10:15   Portable Chest X-ray  Result Date: 07/20/2019 CLINICAL DATA:  43 year old male with intubation. EXAM: PORTABLE CHEST 1 VIEW COMPARISON:  Chest radiograph dated 07/19/2019 FINDINGS: Endotracheal tube with tip approximately 4.5 cm above the carina. Enteric tube extends into the left upper abdomen likely in the stomach. The lungs are clear. There is no pleural effusion or pneumothorax. The cardiac silhouette is within normal limits. No acute osseous pathology. IMPRESSION: 1. No acute  cardiopulmonary process. 2. Endotracheal tube above the carina. Electronically Signed   By: Anner Crete M.D.   On: 07/20/2019 01:56   Vas [place] Lower Extremity Venous (dvt)  Result Date: 07/21/2019  Lower Venous Study Indications: Stroke.  Comparison Study: No prior study on file for comparison Performing Technologist: Sharion Dove RVS  Examination Guidelines: A complete evaluation includes B-mode imaging, spectral Doppler, color Doppler, and power Doppler as needed of all accessible portions of each vessel. Bilateral testing is considered an integral part of a complete examination. Limited examinations for reoccurring indications may be performed as noted.  +---------+---------------+---------+-----------+----------+--------------+  RIGHT     Compressibility Phasicity Spontaneity Properties Thrombus Aging  +---------+---------------+---------+-----------+----------+--------------+  CFV       Full            Yes       Yes                                    +---------+---------------+---------+-----------+----------+--------------+  SFJ       Full                                                             +---------+---------------+---------+-----------+----------+--------------+  FV Prox   Full                                                             +---------+---------------+---------+-----------+----------+--------------+  FV Mid    Full                                                             +---------+---------------+---------+-----------+----------+--------------+  FV Distal Full                                                             +---------+---------------+---------+-----------+----------+--------------+  PFV       Full                                                             +---------+---------------+---------+-----------+----------+--------------+  POP       Full            Yes       Yes                                     +---------+---------------+---------+-----------+----------+--------------+  PTV       Full                                                             +---------+---------------+---------+-----------+----------+--------------+  PERO      None                                             Acute           +---------+---------------+---------+-----------+----------+--------------+   +---------+---------------+---------+-----------+----------+--------------+  LEFT      Compressibility Phasicity Spontaneity Properties Thrombus Aging  +---------+---------------+---------+-----------+----------+--------------+  CFV       Full            Yes       Yes                                    +---------+---------------+---------+-----------+----------+--------------+  SFJ       Full                                                             +---------+---------------+---------+-----------+----------+--------------+  FV Prox   Full                                                             +---------+---------------+---------+-----------+----------+--------------+  FV Mid    Full                                                             +---------+---------------+---------+-----------+----------+--------------+  FV Distal Full                                                             +---------+---------------+---------+-----------+----------+--------------+  PFV       Full                                                             +---------+---------------+---------+-----------+----------+--------------+  POP       Partial         No        Yes                    Acute           +---------+---------------+---------+-----------+----------+--------------+  PTV       None                                             Acute           +---------+---------------+---------+-----------+----------+--------------+  PERO      None                                             Acute            +---------+---------------+---------+-----------+----------+--------------+     Summary: Right: Findings consistent with acute deep vein thrombosis involving the right peroneal veins. Left: Findings consistent with acute deep vein thrombosis involving the left popliteal vein, left posterior tibial veins, and left peroneal veins.  *See table(s) above for measurements and observations.    Preliminary    [place] Ekg Site Rite  Result Date: 07/20/2019 If [facility] image not attached, placement could not be confirmed due  to current cardiac rhythm.   Labs:  CBC: Recent Labs    07/19/19 1040 07/20/19 0019 07/20/19 0417  WBC 16.7*  --   --   HGB 16.8 13.9 13.6  HCT 50.2 41.0 40.0  PLT 275  --   --     COAGS: No results for input(s): INR, APTT in the last 8760 hours.  BMP: Recent Labs    07/19/19 1454  07/20/19 0019  07/20/19 0417  07/20/19 0912  07/20/19 2034 07/21/19 0401 07/21/19 0806 07/21/19 1500  NA 143   < > 147*   < > 149*   < > 148*   < > 151* 154*   153* 154* 155*  K 3.8  --  4.2  --  4.0  --  4.0  --   --  3.7  --   --   CL 107  --   --   --   --   --  119*  --   --  126*  --   --   CO2 22  --   --   --   --   --  20*  --   --  20*  --   --   GLUCOSE 116*  --   --   --   --   --  140*  --   --  138*  --   --   BUN 18  --   --   --   --   --  15  --   --  16  --   --   CALCIUM 9.7  --   --   --   --   --  8.1*  --   --  8.3*  --   --   CREATININE 1.43*  --   --   --   --   --  1.06  --   --  0.96  --   --   GFRNONAA >60  --   --   --   --   --  >60  --   --  >60  --   --   GFRAA >60  --   --   --   --   --  >60  --   --  >60  --   --    < > = values in this interval not displayed.    LIVER FUNCTION TESTS: Recent Labs    07/19/19 1454  BILITOT 1.3*  AST 125*  ALT 50*  ALKPHOS 82  PROT 7.9  ALBUMIN 4.6    TUMOR MARKERS: No results for input(s): AFPTM, CEA, CA199, CHROMGRNA in the last 8760 hours.  Assessment and Plan: LE DVT. Anticoagulation currently  contraindicated. Plan for retrievable IVC filter placement. Labs reviewed, ok. Risks and benefits discussed with the patient's wife Matthew Black including, but not limited to bleeding, infection, contrast induced renal failure, filter fracture or migration which can lead to emergency surgery or even death, strut penetration with damage or irritation to adjacent structures and caval thrombosis.  All questions were answered, patient's wife is agreeable to proceed. Consent signed and in chart.    Thank you for this interesting consult.  I greatly enjoyed meeting Matthew Black and look forward to participating in their care.  A copy of this report was sent to the requesting provider on this date.  Electronically Signed: Ascencion Dike, PA-C 07/21/2019, 4:54 PM   I spent a total of  30 minutes in face to face in clinical consultation, greater than 50% of which was counseling/coordinating care for IVC filter

## 2019-07-21 NOTE — Progress Notes (Signed)
SLP Cancellation Note  Patient Details Name: Matthew Black MRN: [id number] DOB: 1976-01-16   Cancelled treatment:        Currently on vent. Will continue efforts.     Houston Siren 07/21/2019, 8:04 AM'  Orbie Pyo Colvin Caroli.Ed Risk analyst [phone] Office [phone]

## 2019-07-21 NOTE — Progress Notes (Signed)
RT and RN transported pt from 4N16 to CT and back without any complications. Pts respiratory status remained stable on vent with 100% FIO2 throughout trip. RT will continue to monitor.

## 2019-07-21 NOTE — Progress Notes (Signed)
PT Cancellation Note  Patient Details Name: Matthew Black MRN: [id number] DOB: 31-Mar-1976   Cancelled Treatment:    Reason Eval/Treat Not Completed: Patient not medically ready (remains intubated and sedated).  Ellamae Sia, PT, DPT Acute Rehabilitation Services Pager [phone] Office [phone]    Willy Eddy 07/21/2019, 10:05 AM

## 2019-07-21 NOTE — Progress Notes (Signed)
NAME:  ZOEY REPINSKI, MRN:  [id number], DOB:  04/12/1976, LOS: 2 ADMISSION DATE:  07/19/2019, CONSULTATION DATE:  8/25 REFERRING MD:  EDP, CHIEF COMPLAINT:  Stroke   Brief History   43yo male with no known PMH presented 8/25 after being found down at work at [employer] by a coworker covered in vomit and urine.  Last seen normal by his wife 830am 8/24 before he left for work.  CT head showed large R hemisphere infarct with edema and mild mass effect. He was admitted by neurology, placed on 3% saline and PCCM asked to consult for ICU assistance.     Past Medical History  None.  Significant Hospital Events   8/25 > admit. To OR for decompressive crani; returned on vent  8/26 seizure activity; loaded w keppra  Consults:  PCCM. Neurology.  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 The right ICA terminus, right ACA and MCA also appear occluded.  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 Cytotoxic edema but no convincing hemorrhage. Stable mild mass effect with trace leftward midline shift.  CT head 8/26:  cerebral edema decompressing thru the craniotomy  Echo 8/25 > normal LVEF 65% CTA head neck 8/27: 1. Stable from prior MRA. 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 No evidence of atherosclerosis or vasculopathy in the other vessels. 2. Cytotoxic edema causes 5 mm of midline shift and brain bulging through the craniectomy defect. Mild petechial hemorrhage is seen at the basal ganglia.  Micro Data:  COVID 8/25 > negative  Antimicrobials:  None.  Interim history/subjective:  8/26: Sedated, had possible seizure activity earlier today involving rhythmic movement of the right hand/wrist and right leg currently on propofol infusion 8/27: following commands on R and some withdraw to pain on LLE. Not opening eyes. Remains on some propofol.  Will add fentanyl prn  Objective   Blood pressure 136/74, pulse 90, temperature 100 F (37.8 C), resp. rate (!) 22, height 5\' 11"  (1.803 m), weight 102.1 kg, SpO2 100 %.    Vent Mode: PRVC FiO2 (%):  [30 %-40 %] 30 % Set Rate:  [14 bmp] 14 bmp Vt Set:  [600 mL] 600 mL PEEP:  [5 cmH20] 5 cmH20 Plateau Pressure:  [15 cmH20-18 cmH20] 15 cmH20   Intake/Output Summary (Last 24 hours) at 07/21/2019 [id number] Last data filed at 07/21/2019 0800 Gross per 24 hour  Intake 2436.73 ml  Output 2050 ml  Net 386.73 ml   Filed Weights   07/20/19 0000 07/21/19 0500  Weight: 102.5 kg 102.1 kg    Examination: General this is a 43 year old black male currently resting/sedated on full ventilatory support he is unresponsive currently but apparently recently received bolus for ventilator compliance HEENT normocephalic atraumatic pupils pinpoint but reactive no JVD, craniectomy dressing on the right and intact orally intubated no JVD mucous membranes moist Pulmonary: Clear to auscultation with equal chest rise ABG evaluated and acceptable Cardiac: Regular rate and rhythm without murmur rub or gallop Abdomen: Soft nontender, bone flap dressing intact to right lower quadrant positive bowel sounds GU: Clear yellow Neuro: Currently sedated.  Following commands on R and withdraws LLE   Assessment & Plan:   Large right hemispheric infarct (ICA,MCA & ACA occlusion) s/p right decompressive hemicraniectomy 8/25 - unclear etiology at this point.   ->Covid: neg ->CRP and LDH slighty elevated ->CT brain  8/26 showing cerebral edema decompressing thru the craniotomy  Plan Post op care per neuro and neuro surg Cont 3% protocol (goal Na 150-155) Serial neuro checks F/u imaging TBD per neuro team  Probable seizure Plan keppra started On LTM, no events overnight suspect this will be d/c'd soon   Acute respiratory failure in setting of ineffective airway protection  Portable chest x-ray personally reviewed:  Endotracheal tubes about 4.5 cm above the carina there is no infiltrate or airspace disease appreciated Plan Continue full ventilator support PAD protocol, RASS goal 0 to -1, may need to reevaluate this based on neuro goals VAP bundle  At risk for abdominal rhabdo -Last total CK 7533->7768 Plan Repeat total CKs this a.m. Hydrate as able Follow-up blood chemistry  Hypophos:  -replace       Best practice:  Diet:  tube feeds Pain/Anxiety/Delirium protocol (if indicated): fentanyl VAP protocol (if indicated): ongoing DVT prophylaxis: Per primary. GI prophylaxis: N/A. Glucose control: SSI if glucose consistently > 180. Mobility: Bedrest. Code Status: Full. Family Communication: Wife. Disposition:  ICU.  Critically ill due to large right hemisphere occlusive CVA with left-sided hemiplegia complicated by possible seizure this morning.  He remains dependent on mechanical ventilation for airway protection.  Most recent CT imaging of brain suggests worsening cerebral edema likely he has had a decompressive craniectomy we will continue full ventilator support and 3% NS protocol. F/u routine chemistries and start supportive care including tubefeeds and place PICC I suspect he will have a long course  Critical care x37 minutes  Audria Nine DO Pager: [phone] After hours pager: [phone]  [facility] 07/21/2019, 9:18 AM

## 2019-07-21 NOTE — Progress Notes (Signed)
D/C LTM  No skin breakdown

## 2019-07-22 ENCOUNTER — Inpatient Hospital Stay (HOSPITAL_COMMUNITY): Payer: Medicaid Other

## 2019-07-22 DIAGNOSIS — J9601 Acute respiratory failure with hypoxia: Secondary | ICD-10-CM

## 2019-07-22 DIAGNOSIS — I82403 Acute embolism and thrombosis of unspecified deep veins of lower extremity, bilateral: Secondary | ICD-10-CM

## 2019-07-22 DIAGNOSIS — I82433 Acute embolism and thrombosis of popliteal vein, bilateral: Secondary | ICD-10-CM

## 2019-07-22 LAB — GLUCOSE, CAPILLARY
Glucose-Capillary: 107 mg/dL — ABNORMAL HIGH (ref 70–99)
Glucose-Capillary: 110 mg/dL — ABNORMAL HIGH (ref 70–99)
Glucose-Capillary: 119 mg/dL — ABNORMAL HIGH (ref 70–99)
Glucose-Capillary: 126 mg/dL — ABNORMAL HIGH (ref 70–99)
Glucose-Capillary: 130 mg/dL — ABNORMAL HIGH (ref 70–99)

## 2019-07-22 LAB — CBC
HCT: 37.3 % — ABNORMAL LOW (ref 39.0–52.0)
Hemoglobin: 11.6 g/dL — ABNORMAL LOW (ref 13.0–17.0)
MCH: 28.3 pg (ref 26.0–34.0)
MCHC: 31.1 g/dL (ref 30.0–36.0)
MCV: 91 fL (ref 80.0–100.0)
Platelets: 144 10*3/uL — ABNORMAL LOW (ref 150–400)
RBC: 4.1 MIL/uL — ABNORMAL LOW (ref 4.22–5.81)
RDW: 15.6 % — ABNORMAL HIGH (ref 11.5–15.5)
WBC: 12 10*3/uL — ABNORMAL HIGH (ref 4.0–10.5)
nRBC: 0 % (ref 0.0–0.2)

## 2019-07-22 LAB — BASIC METABOLIC PANEL
Anion gap: 7 (ref 5–15)
BUN: 14 mg/dL (ref 6–20)
CO2: 22 mmol/L (ref 22–32)
Calcium: 8.1 mg/dL — ABNORMAL LOW (ref 8.9–10.3)
Chloride: 128 mmol/L — ABNORMAL HIGH (ref 98–111)
Creatinine, Ser: 0.99 mg/dL (ref 0.61–1.24)
GFR calc Af Amer: >60 mL/min (ref >60)
GFR calc non Af Amer: >60 mL/min (ref >60)
Glucose, Bld: 133 mg/dL — ABNORMAL HIGH (ref 70–99)
Potassium: 3.5 mmol/L (ref 3.5–5.1)
Sodium: 157 mmol/L — ABNORMAL HIGH (ref 135–145)

## 2019-07-22 LAB — BETA-2-GLYCOPROTEIN I ABS, IGG/M/A
Beta-2 Glyco I IgG: <9 GPI IgG units (ref 0–20)
Beta-2-Glycoprotein I IgA: <9 GPI IgA units (ref 0–25)
Beta-2-Glycoprotein I IgM: <9 GPI IgM units (ref 0–32)

## 2019-07-22 LAB — HOMOCYSTEINE: Homocysteine: 9.8 umol/L (ref 0.0–14.5)

## 2019-07-22 LAB — CARDIOLIPIN ANTIBODIES, IGG, IGM, IGA
Anticardiolipin IgA: <9 APL U/mL (ref 0–11)
Anticardiolipin IgG: <9 GPL U/mL (ref 0–14)
Anticardiolipin IgM: <9 MPL U/mL (ref 0–12)

## 2019-07-22 LAB — ANTINUCLEAR ANTIBODIES, IFA: ANA Ab, IFA: NEGATIVE

## 2019-07-22 LAB — SODIUM
Sodium: 157 mmol/L — ABNORMAL HIGH (ref 135–145)
Sodium: 157 mmol/L — ABNORMAL HIGH (ref 135–145)

## 2019-07-22 MED ORDER — POTASSIUM PHOSPHATES 15 MMOLE/5ML IV SOLN
30.0000 mmol | Freq: Once | INTRAVENOUS | Status: AC
  Administered 2019-07-22: 30 mmol via INTRAVENOUS
  Filled 2019-07-22: qty 10

## 2019-07-22 MED ORDER — SODIUM CHLORIDE 0.9 % IV SOLN
INTRAVENOUS | Status: DC | PRN
  Administered 2019-07-22: 250 mL via INTRAVENOUS
  Administered 2019-07-30: 11:00:00 1000 mL via INTRAVENOUS

## 2019-07-22 MED ORDER — FAMOTIDINE 40 MG/5ML PO SUSR
20.0000 mg | Freq: Two times a day (BID) | ORAL | Status: DC
  Administered 2019-07-22 – 2019-09-05 (×88): 20 mg
  Filled 2019-07-22 (×90): qty 2.5

## 2019-07-22 NOTE — Progress Notes (Signed)
  NEUROSURGERY PROGRESS NOTE   Patient seen and examined Slight tenseness to craniectomy site. No active drainage/bleeding. Abdomen incision: soft, no swelling or active bleeding  IMPRESSION/PLAN 43 y.o. male  POD #3 right hemicraniectomy for right MCA/ACA infarct. Remains intubated.  - Continue current care per PCCM and Neuro - No new NS recs. Will need staples removed in 14 days and outpatient follow up once discharged. Will sign off. Please call for any concerns.

## 2019-07-22 NOTE — Progress Notes (Signed)
SLP Cancellation Note  Patient Details Name: Matthew Black MRN: [id number] DOB: [birth year]   Cancelled treatment:       Reason Eval/Treat Not Completed: Medical issues which prohibited therapy(Pt remains intubated at this time. SLP will follow up. )  Seraphim Trow I. Hardin Negus, [place], [place] Office number [phone] Pager [place] 07/22/2019, 8:10 AM

## 2019-07-22 NOTE — Progress Notes (Signed)
Unable to irrigate and pull back on patient's OGT. After several unsuccessful attempts to unclog, removed OGT and replaced. Waiting on Portable Abd Xray to confirm placement

## 2019-07-22 NOTE — Progress Notes (Signed)
NAME:  Matthew Black, MRN:  [id number], DOB:  Jun 07, 1976, LOS: 3 ADMISSION DATE:  07/19/2019, CONSULTATION DATE:  8/25 REFERRING MD:  EDP, CHIEF COMPLAINT:  Stroke   Brief History   43yo male with no known PMH presented 8/25 after being found down at work at [employer] by a coworker covered in vomit and urine.  Last seen normal by his wife 830am 8/24 before he left for work.  CT head showed large R hemisphere infarct with edema and mild mass effect. He was admitted by neurology, placed on 3% saline and PCCM asked to consult for ICU assistance.    Past Medical History  None.  Significant Hospital Events   8/25 > admit. To OR for decompressive crani; returned on vent  8/26: Sedated, had possible seizure activity earlier today involving rhythmic movement of the right hand/wrist and right leg currently on propofol infusion 8/27: following commands on R and some withdraw to pain on LLE. Not opening eyes. Remains on some propofol. Will add fentanyl prn. IVC filter placed late in day 8/27 8/28:   Consults:  PCCM.  Procedures:  8/26: Picc>> 8/26 ETT>>  Significant Diagnostic Tests:  MR brain 8/25>>> The right ICA is occluded from its origin. The right ICA terminus, right ACA and MCA also appear occluded.  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 Cytotoxic edema but no convincing hemorrhage. Stable mild mass effect with trace leftward midline shift.  CT head 8/26:  cerebral edema decompressing thru the craniotomy  Echo 8/25 > normal LVEF 65% CTA head neck 8/27: 1. Stable from prior MRA. 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 No evidence of atherosclerosis or vasculopathy in the other vessels. 2. Cytotoxic edema causes 5 mm of midline shift and brain bulging through the craniectomy defect. Mild petechial hemorrhage is seen at the basal ganglia. BLE DVT vasc US 8/28: acute DVT R peroneal vein, L popliteal vein, L  posterior tibial vein, L peroneal veins  Micro Data:  COVID 8/25 > negative BCx 8/25> no growth to date   Antimicrobials:  None.  Interim history/subjective:  IVC filter placed yesterday late afternoon with IR NAEO  Tachypneic on SBT this AM, resumed full vent support   Objective   Blood pressure 120/68, pulse 84, temperature 99.7 F (37.6 C), resp. rate (!) 21, height 5\' 11"  (1.803 m), weight 101.8 kg, SpO2 100 %.    Vent Mode: PRVC FiO2 (%):  [30 %] 30 % Set Rate:  [14 bmp] 14 bmp Vt Set:  [600 mL] 600 mL PEEP:  [5 cmH20] 5 cmH20 Pressure Support:  [8 cmH20] 8 cmH20 Plateau Pressure:  [16 cmH20-31 cmH20] 31 cmH20   Intake/Output Summary (Last 24 hours) at 07/22/2019 0817 Last data filed at 07/22/2019 [id number] Gross per 24 hour  Intake 2861.02 ml  Output 650 ml  Net 2211.02 ml   Filed Weights   07/20/19 0000 07/21/19 0500 07/22/19 0500  Weight: 102.5 kg 102.1 kg 101.8 kg    Examination: General-Critically ill appearing adult M, intubated sedated NAD  HEENT- R craniectomy dressing c/d/i. Pink mmm, ETT secure, OGT secure, trachea midline. Mild scleral icterus  Pulmonary: CTA bilaterally. Symmetrical chest expansion, No accessory muscle use  Cardiac: RRR s1s2 no rgm. Cap refill < 3 sec BUE BLE.  Abdomen: soft, flat, ndnt. RLQ bone flap incision cdi  GU: Yellow, clear urine collecting via condom cath  Neuro: Lightly sedated, somnolent. Opens eyes  to command. Moves right sided extremities spontaneously and to command. L side withdraws from painful stimuli. PERRL  Skin: clean dry warm without rash. Former L radial arterial line site without hematoma, without bleeding.   Assessment & Plan:   Acute respiratory failure requiring mechanical ventilation, in setting of inadequate airway protection -in setting of CVA below  Plan -Continue MV -Continue VAP bundle and PAD protocol for RASS 0 to -1  -AM CXR  Large right hemispheric infarct (ICA,MCA & ACA occlusion) s/p right  decompressive hemicraniectomy 8/25 - unclear etiology at this point. Possible dissection per neuro ->Covid: neg ->CRP and LDH slighty elevated ->CT brain 8/26 showing cerebral edema decompressing thru the craniotomy  Cytotoxic cerebral edema Plan -Post-crani care per NSGY -CVA per Neuro -Na goal 150-155 per neuro, 3% via PICC per neuro  -Serial sodium checks  -Follow up imaging per neuro  Possible seizure  LTM did not show seizure activity. Cortical dysfunction of R frontal region, likely 2/2 CVA and cerebral edema Plan -Continue Keppra  -LTM discontinued per neuro   Acute encephalopathy  -in setting of CVA, critical illness, sedation, possible seizure P -CVA, possible seizure care as above -Minimize sedation as safely possible, RASS goal 0 to -1 -RN ICU delirium precaution bundle   DVT -acute DVT R peroneal vein, L popliteal vein, L posterior tibial vein, L peroneal veins P -IVC filter placed 8/27 by IR   At risk for abdominal rhabdo -total CK 7533->7768 -> 7224 (8/28) Plan -Continue to trend CK -Check urine myoglobin  -Continue to hydrate as able  -Renal function remains stable -Trend BMP, check LFT in AM   Malnutrition, at risk Hypophosphatemia  P -EN -replace with KPhos, recheck in AM    Best practice:  Diet:  tube feeds Pain/Anxiety/Delirium protocol (if indicated): fentanyl, prop VAP protocol (if indicated): ongoing DVT prophylaxis: Per primary. GI prophylaxis: N/A. Glucose control: monitor. No SSI indicated at this time  Mobility: Bedrest. Code Status: Full. Family Communication: Per primary  Disposition:  ICU.  CRITICAL CARE  Total critical care time: 40 minutes  Critical care time was exclusive of separately billable procedures and treating other patients.  Critical care was necessary to treat or prevent imminent or life-threatening deterioration.  Critical care was time spent personally by me on the following activities: development of  treatment plan with patient and/or surrogate as well as nursing, discussions with consultants, evaluation of patient's response to treatment, examination of patient, obtaining history from patient or surrogate, ordering and performing treatments and interventions, ordering and review of laboratory studies, ordering and review of radiographic studies, pulse oximetry and re-evaluation of patient's condition.  Eliseo Gum MSN, AGACNP-BC [place] [phone] If no answer, [phone] 07/22/2019, 8:17 AM

## 2019-07-22 NOTE — Progress Notes (Signed)
STROKE TEAM PROGRESS NOTE   INTERVAL HISTORY Pt still intubated on sedation. As per RN, with sedation off, pt agitated so the propofol has to be back on. Without propofo, RN stated that pt follows commands on the right and able to hold right up in the air. Still has Temp 100.9 overnight. Found to have DVT b/l LEs, s/p IVC filter yesterday afternoon.    Vitals:   07/22/19 0751 07/22/19 0800 07/22/19 0812 07/22/19 0900  BP:  (!) 134/111  126/73  Pulse: 84 89 84 66  Resp: (!) 21 (!) 29 (!) 33 (!) 21  Temp:  99.5 F (37.5 C)  99 F (37.2 C)  TempSrc:      SpO2: 100% 100% 100% 98%  Weight:      Height:        CBC:  Recent Labs  Lab 07/19/19 1040  07/20/19 0417 07/22/19 0453  WBC 16.7*  --   --  12.0*  NEUTROABS 14.2*  --   --   --   HGB 16.8   < > 13.6 11.6*  HCT 50.2   < > 40.0 37.3*  MCV 85.4  --   --  91.0  PLT 275  --   --  144*   < > = values in this interval not displayed.    Basic Metabolic Panel:  Recent Labs  Lab 07/21/19 0401  07/21/19 2131 07/22/19 0453  NA 154*  153*   < > 155* 157*  K 3.7  --   --  3.5  CL 126*  --   --  128*  CO2 20*  --   --  22  GLUCOSE 138*  --   --  133*  BUN 16  --   --  14  CREATININE 0.96  --   --  0.99  CALCIUM 8.3*  --   --  8.1*  MG 2.5*  --  2.2  --   PHOS 1.7*  --  1.8*  --    < > = values in this interval not displayed.   Lipid Panel:     Component Value Date/Time   CHOL 140 07/20/2019 0158   TRIG 138 07/20/2019 0617   HDL 40 (L) 07/20/2019 0158   CHOLHDL 3.5 07/20/2019 0158   VLDL 17 07/20/2019 0158   LDLCALC 83 07/20/2019 0158   HgbA1c:  Lab Results  Component Value Date   HGBA1C 5.4 07/20/2019   Urine Drug Screen:     Component Value Date/Time   LABOPIA NONE DETECTED 07/19/2019 1348   COCAINSCRNUR NONE DETECTED 07/19/2019 1348   LABBENZ NONE DETECTED 07/19/2019 1348   AMPHETMU NONE DETECTED 07/19/2019 1348   THCU POSITIVE (A) 07/19/2019 1348   LABBARB NONE DETECTED 07/19/2019 1348    Alcohol Level      Component Value Date/Time   ETH <10 07/19/2019 1040    IMAGING Ct Head Wo Contrast Ct Cervical Spine Wo Contrast  07/19/2019 1132 1. 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 2. No associated hemorrhage and mild intracranial mass effect at this time, including trace leftward midline shift. 3. Evidence of large vessel occlusion: Hyperdensity of the right ICA terminus, the right A1 and MCA. 4. Unaffected brain parenchyma appears negative. 5.  No acute traumatic injury identified in the cervical spine.   Mr Brain 19 Contrast Mr Angio Head Wo Contrast Mr Angio Neck Wo Contrast 07/19/2019 1330 1. The right ICA is occluded from its  origin. The right ICA terminus, right ACA and MCA also appear occluded. 2. 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 Cytotoxic edema but no convincing hemorrhage. Stable mild mass effect with trace leftward midline shift. 3. Intracranial MRA is degraded by motion artifact, but no other large vessel occlusion is suspected. There appears to be lobes reperfusion of the right PCA. 4. The left hemisphere and posterior fossa brain parenchyma appears normal.   Ct Head Wo Contrast 07/19/2019 1532 Similar appearance to earlier. Low-density and swelling of the right hemisphere in the ACA and MCA territories with mass effect and right-to-left shift of 5-6 mm. No hemorrhagic transformation at this time.   Ct Head Wo Contrast 07/20/2019 0444 1. Acute right ACA and MCA territory infarct with progressive swelling that has decompressed through the craniectomy defect. 2. No acute hemorrhage or new infarction.   Ct Angio Head W Or Wo Contrast Ct Angio Neck W Or Wo Contrast 07/21/2019 1. Stable from prior MRA. 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 No evidence of atherosclerosis or vasculopathy in the other vessels. 2. Cytotoxic edema causes 5 mm  of midline shift and brain bulging through the craniectomy defect. Mild petechial hemorrhage is seen at the basal ganglia.   2D Echocardiogram  1. The left ventricle has normal systolic function with an ejection fraction of 60-65%. The cavity size was normal. Left ventricular diastolic parameters were normal.  2. The right ventricle has normal systolic function. The cavity was normal. There is no increase in right ventricular wall thickness.  3. The pericardial effusion is circumferential.  4. Trivial pericardial effusion is present.  5. The mitral valve is grossly normal.  6. The tricuspid valve is grossly normal.  7. The aortic valve is tricuspid. No stenosis of the aortic valve.  8. The aorta is normal unless otherwise noted.  9. The aortic root is normal in size and structure. 10. No cardiac source of embolism identified. 11. When compared to the prior study: No comparison.  LE Dopplers Right: Findings consistent with acute deep vein thrombosis involving the right peroneal veins. Left: Findings consistent with acute deep vein thrombosis involving the left popliteal vein, left posterior tibial veins, and left peroneal veins.  PHYSICAL EXAM   Temp:  [99 F (37.2 C)-101.5 F (38.6 C)] 99 F (37.2 C) (08/28 0900) Pulse Rate:  [66-99] 66 (08/28 0900) Resp:  [14-33] 21 (08/28 0900) BP: (95-155)/(54-111) 126/73 (08/28 0900) SpO2:  [96 %-100 %] 98 % (08/28 0900) Arterial Line BP: (98-168)/(86-141) 98/91 (08/27 2100) FiO2 (%):  [30 %] 30 % (08/28 0809) Weight:  [101.8 kg] 101.8 kg (08/28 0500)  General - Well nourished, well developed, intubated on sedation.  Ophthalmologic - fundi not visualized due to noncooperation.  Cardiovascular - Regular rate and rhythm.  Neuro - intubated on sedation, eyes close, not following commands. With forced eye opening, eyes in mid position, not blinking to visual threat, doll's eyes sluggish, not tracking, PERRL, brisk to light response. Corneal  reflex present weakly bilaterally, gag and cough present. Breathing over the vent.  Facial symmetry not able to test due to ET tube.  Tongue protrusion not cooperative. Had intermittent spontaneous movement of RUE and RLE. On pain stimulation, mild withdraw of RUE and RLE. But no movement of LUE and LLE. DTR 1+ and no babinski. Sensation, coordination and gait not tested.   ASSESSMENT/PLAN Mr. QUENT LINGENFELTER is a 43 y.o. male with  no significant past medical history found down x 2 days nonverbal with L hemiparesis.   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 Trace L midline shift. ELVO at R ICA, R A1, R MCA.  MRI  Large R brain infarct sparing R PCA. Cytotoxic edema but no hemorrhage. Stable trace midline shift.  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 R ICA terminus, R ACA, R MCA occluded.   CT head similar w/ low density and sweddling R ACA and MCA territories, now with 5-15mm midline shift. no hemorrhage  CT head acute R ACA and MCA infarct w/ progressive swelling decompressed through craniectomy defect.   CTA head and neck stable MLS. R ICA occlusion in neck that continues into R ACA and MCA. Mild petechial hemorrhage at basal ganglia  Repeat CT in am  2D Echo EF 60-65%  LE venous doppler DVT in R peroneal, L popliteal, L posterior tibial and L peroneal  May consider TEE later once stable  LDL 83  HgbA1c 5.4  UDS positive THC  Hypercoagulable and autoimmune work up pending  Heparin subq for VTE prophylaxis  No antithrombotic prior to admission, now on aspirin 325mg  daily.   Therapy recommendations:  pending   Disposition:  pending   Cyctotoxic cerebral edema  S/p R decompressive hemicraniectomy (Nundkumar) w/ flap R abd  On 3% saline @ 75  PICC placed  Na 146->148->154->155->157  Given One dose of  23.4% 8/26  Goal Na 150-160  ?? Right ICA dissection  MRA and CTA showed right ICA occlusion from origin to terminal  Wife denies any head trauma  Wife stated that pt had recent aggressive exercise with weight lifting  Concerning dissection as working diagnosis of stroke etiology   On ASA   B LE DVT  LE venous doppler DVT in R peroneal, L popliteal, L posterior tibial and L peroneal  Etiology unclear  Not a candidate for [facility] given large stroke and risk of hemorrhage  IVC filter placed 8/27  Hypercoagulable work up pending  D/c SCDs  Acute Respiratory Failure with inadequate airway protection  Intubated for airway protection  CCM on board   On vent  On sedation, propofol  Seizure-like activity  Continue Keppra   EEG continuous slowing, excessive beta activity, sleep spindle asymmetry decreased R->related to stroke and sedation. No SZ  LT EEG cortical dysfunction in right frontal region  Seizure precautions  Febrile, Leukocytosis  Tmax 103.1->101.4->101.8->100.9  WBC 16.7->12.0  Vomited PTA, could be due to aspiration  CXR NAD, repeat CXR yest unchanged x temp probe. Today's is pending     Hyperlipidemia  Home meds:  no statin  LDL 83, goal < 70  Add lipitor 20 mg daily   Continue statin at discharge  Dysphagia . Secondary to stroke . NPO . On TF @ 20  Other Stroke Risk Factors  Former Cigarette smoker, quit 3 yrs ago  ETOH use  Substance abuse UDS - positive for THC  Obesity, Body mass index is 31.3 kg/m., recommend weight loss, diet and exercise as appropriate   Other Active Problems  At risk for rhabdo CK 7533. Hydrate as able. LDH 359. CRP 19.2.    Hypophosphatemia, hypomagnesemia - replace, check  Hospital day # 3  This patient is critically ill due to large right MCA  and ACA infarct, s/p hemicrani, seizure, cerebral edema and at significant risk of neurological worsening, death form brain herniation, malignant cerebral  edema, status epilepticus, respiratory failure, sepsis. This patient's care requires constant monitoring of vital signs, hemodynamics, respiratory and cardiac monitoring, review of multiple databases, neurological assessment, discussion with family, other specialists and medical decision making of high complexity.  Rosalin Hawking, MD PhD Stroke Neurology 07/22/2019 9:20 AM   To contact Stroke Continuity provider, please refer to [email or web address] After hours, contact General Neurology

## 2019-07-23 ENCOUNTER — Inpatient Hospital Stay (HOSPITAL_COMMUNITY): Payer: Medicaid Other

## 2019-07-23 DIAGNOSIS — Z978 Presence of other specified devices: Secondary | ICD-10-CM

## 2019-07-23 LAB — CBC
HCT: 35.1 % — ABNORMAL LOW (ref 39.0–52.0)
Hemoglobin: 11 g/dL — ABNORMAL LOW (ref 13.0–17.0)
MCH: 28.2 pg (ref 26.0–34.0)
MCHC: 31.3 g/dL (ref 30.0–36.0)
MCV: 90 fL (ref 80.0–100.0)
Platelets: 146 10*3/uL — ABNORMAL LOW (ref 150–400)
RBC: 3.9 MIL/uL — ABNORMAL LOW (ref 4.22–5.81)
RDW: 15.6 % — ABNORMAL HIGH (ref 11.5–15.5)
WBC: 10.3 10*3/uL (ref 4.0–10.5)
nRBC: 0 % (ref 0.0–0.2)

## 2019-07-23 LAB — HEPATIC FUNCTION PANEL
ALT: 68 U/L — ABNORMAL HIGH (ref 0–44)
AST: 132 U/L — ABNORMAL HIGH (ref 15–41)
Albumin: 2.5 g/dL — ABNORMAL LOW (ref 3.5–5.0)
Alkaline Phosphatase: 51 U/L (ref 38–126)
Bilirubin, Direct: 0.1 mg/dL (ref 0.0–0.2)
Indirect Bilirubin: 0.4 mg/dL (ref 0.3–0.9)
Total Bilirubin: 0.5 mg/dL (ref 0.3–1.2)
Total Protein: 5.7 g/dL — ABNORMAL LOW (ref 6.5–8.1)

## 2019-07-23 LAB — GLUCOSE, CAPILLARY
Glucose-Capillary: 103 mg/dL — ABNORMAL HIGH (ref 70–99)
Glucose-Capillary: 105 mg/dL — ABNORMAL HIGH (ref 70–99)
Glucose-Capillary: 142 mg/dL — ABNORMAL HIGH (ref 70–99)
Glucose-Capillary: 117 mg/dL — ABNORMAL HIGH (ref 70–99)
Glucose-Capillary: 123 mg/dL — ABNORMAL HIGH (ref 70–99)
Glucose-Capillary: 110 mg/dL — ABNORMAL HIGH (ref 70–99)

## 2019-07-23 LAB — MAGNESIUM: Magnesium: 2.2 mg/dL (ref 1.7–2.4)

## 2019-07-23 LAB — BASIC METABOLIC PANEL
Anion gap: 8 (ref 5–15)
BUN: 17 mg/dL (ref 6–20)
CO2: 24 mmol/L (ref 22–32)
Calcium: 8.3 mg/dL — ABNORMAL LOW (ref 8.9–10.3)
Chloride: 127 mmol/L — ABNORMAL HIGH (ref 98–111)
Creatinine, Ser: 0.97 mg/dL (ref 0.61–1.24)
GFR calc Af Amer: >60 mL/min (ref >60)
GFR calc non Af Amer: >60 mL/min (ref >60)
Glucose, Bld: 130 mg/dL — ABNORMAL HIGH (ref 70–99)
Potassium: 3.4 mmol/L — ABNORMAL LOW (ref 3.5–5.1)
Sodium: 159 mmol/L — ABNORMAL HIGH (ref 135–145)

## 2019-07-23 LAB — CK TOTAL AND CKMB (NOT AT ARMC)
CK, MB: 1.9 ng/mL (ref 0.5–5.0)
Total CK: 5972 U/L — ABNORMAL HIGH (ref 49–397)

## 2019-07-23 LAB — LUPUS ANTICOAGULANT PANEL
DRVVT: 48.3 s — ABNORMAL HIGH (ref 0.0–47.0)
PTT Lupus Anticoagulant: 32.1 s (ref 0.0–51.9)

## 2019-07-23 LAB — SODIUM
Sodium: 161 mmol/L (ref 135–145)
Sodium: 159 mmol/L — ABNORMAL HIGH (ref 135–145)
Sodium: 156 mmol/L — ABNORMAL HIGH (ref 135–145)
Sodium: 153 mmol/L — ABNORMAL HIGH (ref 135–145)

## 2019-07-23 LAB — PHOSPHORUS: Phosphorus: 3.8 mg/dL (ref 2.5–4.6)

## 2019-07-23 LAB — DRVVT MIX: dRVVT Mix: 40.4 s (ref 0.0–47.0)

## 2019-07-23 LAB — TRIGLYCERIDES: Triglycerides: 151 mg/dL — ABNORMAL HIGH (ref <150)

## 2019-07-23 MED ORDER — POTASSIUM CHLORIDE 20 MEQ/15ML (10%) PO SOLN
20.0000 meq | Freq: Three times a day (TID) | ORAL | Status: AC
  Administered 2019-07-23 (×3): 20 meq
  Filled 2019-07-23 (×3): qty 15

## 2019-07-23 MED ORDER — POTASSIUM CHLORIDE 10 MEQ/100ML IV SOLN
10.0000 meq | INTRAVENOUS | Status: AC
  Administered 2019-07-23 (×3): 10 meq via INTRAVENOUS
  Filled 2019-07-23: qty 100

## 2019-07-23 NOTE — Progress Notes (Signed)
RT and RN transported pt from 4N16 to CT and back without complication. Pt respiratory status stable throughout transport. RT will continue to monitor.

## 2019-07-23 NOTE — Progress Notes (Signed)
NAME:  Matthew Black, MRN:  [id number], DOB:  November 14, 1976, LOS: 4 ADMISSION DATE:  07/19/2019, CONSULTATION DATE:  8/25 REFERRING MD:  EDP, CHIEF COMPLAINT:  Stroke   Brief History   43yo male with no known PMH presented 8/25 after being found down at work at [employer] by a coworker covered in vomit and urine.  Last seen normal by his wife 830am 8/24 before he left for work.  CT head showed large R hemisphere infarct with edema and mild mass effect. He was admitted by neurology, placed on 3% saline and PCCM asked to consult for ICU assistance.    Past Medical History  None.  Significant Hospital Events   8/25 > admit. To OR for decompressive crani; returned on vent  8/26: Sedated, had possible seizure activity earlier today involving rhythmic movement of the right hand/wrist and right leg currently on propofol infusion 8/27: following commands on R and some withdraw to pain on LLE. Not opening eyes. Remains on some propofol. Will add fentanyl prn. IVC filter placed late in day 8/27 8/28:   Consults:  PCCM.  Procedures:  8/26: Picc>> 8/26 ETT>> 8/27 IVC filter  Significant Diagnostic Tests:  MR brain 8/25>>> The right ICA is occluded from its origin. The right ICA terminus, right ACA and MCA also appear occluded.  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 Cytotoxic edema but no convincing hemorrhage. Stable mild mass effect with trace leftward midline shift.  CT head 8/26:  cerebral edema decompressing thru the craniotomy  Echo 8/25 > normal LVEF 65% CTA head neck 8/27: 1. Stable from prior MRA. 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 No evidence of atherosclerosis or vasculopathy in the other vessels. 2. Cytotoxic edema causes 5 mm of midline shift and brain bulging through the craniectomy defect. Mild petechial hemorrhage is seen at the basal ganglia. BLE DVT vasc US 8/28: acute DVT R peroneal vein, L  popliteal vein, L posterior tibial vein, L peroneal veins  Micro Data:  COVID 8/25 > negative BCx 8/25> no growth to date   Antimicrobials:  None.  Interim history/subjective:  No overnight events. Tolerating SBT this a.m.  Objective   Blood pressure 131/80, pulse 82, temperature 99.5 F (37.5 C), resp. rate 19, height 5\' 11"  (1.803 m), weight 101.6 kg, SpO2 100 %.    Vent Mode: CPAP;PSV FiO2 (%):  [30 %] 30 % Set Rate:  [14 bmp] 14 bmp Vt Set:  [600 mL] 600 mL PEEP:  [5 cmH20] 5 cmH20 Pressure Support:  [8 cmH20] 8 cmH20 Plateau Pressure:  [14 cmH20-23 cmH20] 16 cmH20   Intake/Output Summary (Last 24 hours) at 07/23/2019 0903 Last data filed at 07/23/2019 0600 Gross per 24 hour  Intake 2872.33 ml  Output 2400 ml  Net 472.33 ml   Filed Weights   07/21/19 0500 07/22/19 0500 07/23/19 0500  Weight: 102.1 kg 101.8 kg 101.6 kg    Examination: General-Critically ill appearing adult M, intubated NAD  HEENT- R craniectomy dressing c/d/i. Pink mmm, ETT secure, OGT secure, trachea midline. Mild scleral icterus  Pulmonary: CTA bilaterally. Symmetrical chest expansion, No accessory muscle use  Cardiac: RRR s1s2 no rgm. Cap refill < 2 sec BUE BLE.  Abdomen: soft, flat, ndnt. RLQ bone flap incision cdi  GU: Yellow, clear urine collecting via condom cath  Neuro: does not eyes to command. Right deviant gaze, no nystagmus, unable to track left. PERRL. Shoulder shrug  R but not L; will not stick tongue out. Moves right sided extremities spontaneously and to command. L side withdraws from painful stimuli.   Skin: clean dry warm without rash. Former L radial arterial line site without hematoma, without bleeding.   Assessment & Plan:   Acute respiratory failure requiring mechanical ventilation, in setting of inadequate airway protection -in setting of CVA below  Plan -Continue MV -Continue VAP bundle and PAD protocol for RASS 0 to -1  -some concern for airway protection but if does well  on SBT will trial extubation later today  Large right hemispheric infarct (ICA,MCA & ACA occlusion) s/p right decompressive hemicraniectomy 8/25 - unclear etiology at this point. Possible dissection per neuro ->Covid: neg ->CRP and LDH slighty elevated ->CT brain 8/26 showing cerebral edema decompressing thru the craniotomy  Cytotoxic cerebral edema Plan -Post-crani care per NSGY -CVA per Neuro -Na goal 150-155 per neuro, 3% via PICC per neuro  -Serial sodium checks  -Follow up imaging per neuro  Possible seizure  LTM did not show seizure activity. Cortical dysfunction of R frontal region, likely 2/2 CVA and cerebral edema Plan -Continue Keppra  -LTM discontinued per neuro   Acute encephalopathy  -Minimize sedation as safely possible, RASS goal 0 to -1 -RN ICU delirium precaution bundle   DVT -acute DVT R peroneal vein, L popliteal vein, L posterior tibial vein, L peroneal veins P -IVC filter placed 8/27 by IR   At risk for abdominal rhabdo -total CK 7533->7768 -> 7224 (8/28) Plan -Continue to trend CK -Check urine myoglobin  -Continue to hydrate as able  -Renal function remains stable -Trend BMP, check LFT in AM   Malnutrition, at risk Hypophosphatemia  P -EN -replete electrolytes   Best practice:  Diet:  tube feeds Pain/Anxiety/Delirium protocol (if indicated): fentanyl, prop VAP protocol (if indicated): ongoing DVT prophylaxis: Per primary. GI prophylaxis: N/A. Glucose control: monitor. No SSI indicated at this time  Mobility: Bedrest. Code Status: Full. Family Communication: Per primary  Disposition:  ICU.  CRITICAL CARE  Total critical care time: 45minutes  Critical care time was exclusive of separately billable procedures and treating other patients. Critical care was necessary to treat or prevent imminent or life-threatening deterioration.  Critical care was time spent personally by me on the following activities: development of treatment plan  with patient and/or surrogate as well as nursing, discussions with consultants, evaluation of patient's response to treatment, examination of patient, obtaining history from patient or surrogate, ordering and performing treatments and interventions, ordering and review of laboratory studies, ordering and review of radiographic studies, pulse oximetry and re-evaluation of patient's condition.  Bonna Gains, MD PhD 07/23/2019, 9:03 AM

## 2019-07-23 NOTE — Progress Notes (Signed)
STROKE TEAM PROGRESS NOTE   INTERVAL HISTORY Pt still intubated  not on sedation. As per RN, with sedation off, pt can be restless or agitated so the propofol has to be back on. Follows commands on the right and able to hold right up in the air moving right side spontaneously, no movement on the left. Still has Temp improved overnight. Found to have DVT b/l LEs, s/p IVC filter yesterday afternoon.  On 3%.   Vitals:   07/23/19 0806 07/23/19 0900 07/23/19 1000 07/23/19 1057  BP: 131/80 137/89 138/79 138/79  Pulse: 82 69 74 95  Resp: 19 15 19  (!) 37  Temp:  99.1 F (37.3 C) 98.8 F (37.1 C)   TempSrc:      SpO2: 100% 99% 97% 100%  Weight:      Height:        CBC:  Recent Labs  Lab 07/19/19 1040  07/22/19 0453 07/23/19 0459  WBC 16.7*  --  12.0* 10.3  NEUTROABS 14.2*  --   --   --   HGB 16.8   < > 11.6* 11.0*  HCT 50.2   < > 37.3* 35.1*  MCV 85.4  --  91.0 90.0  PLT 275  --  144* 146*   < > = values in this interval not displayed.    Basic Metabolic Panel:  Recent Labs  Lab 07/21/19 2131 07/22/19 0453  07/23/19 0459 07/23/19 0801  NA 155* 157*   < > 159* 159*  K  --  3.5  --  3.4*  --   CL  --  128*  --  127*  --   CO2  --  22  --  24  --   GLUCOSE  --  133*  --  130*  --   BUN  --  14  --  17  --   CREATININE  --  0.99  --  0.97  --   CALCIUM  --  8.1*  --  8.3*  --   MG 2.2  --   --  2.2  --   PHOS 1.8*  --   --  3.8  --    < > = values in this interval not displayed.   Lipid Panel:     Component Value Date/Time   CHOL 140 07/20/2019 0158   TRIG 151 (H) 07/23/2019 0115   HDL 40 (L) 07/20/2019 0158   CHOLHDL 3.5 07/20/2019 0158   VLDL 17 07/20/2019 0158   LDLCALC 83 07/20/2019 0158   HgbA1c:  Lab Results  Component Value Date   HGBA1C 5.4 07/20/2019   Urine Drug Screen:     Component Value Date/Time   LABOPIA NONE DETECTED 07/19/2019 1348   COCAINSCRNUR NONE DETECTED 07/19/2019 1348   LABBENZ NONE DETECTED 07/19/2019 1348   AMPHETMU NONE DETECTED  07/19/2019 1348   THCU POSITIVE (A) 07/19/2019 1348   LABBARB NONE DETECTED 07/19/2019 1348    Alcohol Level     Component Value Date/Time   ETH <10 07/19/2019 1040    IMAGING  Ct Head Wo Contrast Ct Cervical Spine Wo Contrast  07/19/2019 1132 1. 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 2. No associated hemorrhage and mild intracranial mass effect at this time, including trace leftward midline shift. 3. Evidence of large vessel occlusion: Hyperdensity of the right ICA terminus, the right A1 and MCA. 4. Unaffected brain parenchyma appears negative. 5.  No acute traumatic injury  identified in the cervical spine.   Mr Brain 60 Contrast Mr Angio Head Wo Contrast Mr Angio Neck Wo Contrast 07/19/2019 1330 1. 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 The right ICA terminus, right ACA and MCA also appear occluded. 2. 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 Cytotoxic edema but no convincing hemorrhage. Stable mild mass effect with trace leftward midline shift. 3. Intracranial MRA is degraded by motion artifact, but no other large vessel occlusion is suspected. There appears to be lobes reperfusion of the right PCA. 4. The left hemisphere and posterior fossa brain parenchyma appears normal.   Ct Head Wo Contrast 07/19/2019 1532 Similar appearance to earlier. Low-density and swelling of the right hemisphere in the ACA and MCA territories with mass effect and right-to-left shift of 5-6 mm. No hemorrhagic transformation at this time.   Ct Head Wo Contrast 07/20/2019 0444 1. Acute right ACA and MCA territory infarct with progressive swelling that has decompressed through the craniectomy defect. 2. No acute hemorrhage or new infarction.   CT Head WO Contrast 07/23/2019 IMPRESSION: 1. Unchanged appearance of massive right MCA and ACA territory infarcts with parenchyma extending through decompressive  craniectomy. 2. Small focus of suspected hemorrhage adjacent to the right caudate head.  Ct Angio Head W Or Wo Contrast Ct Angio Neck W Or Wo Contrast 07/21/2019 1. Stable from prior MRA. 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 No evidence of atherosclerosis or vasculopathy in the other vessels. 2. Cytotoxic edema causes 5 mm of midline shift and brain bulging through the craniectomy defect. Mild petechial hemorrhage is seen at the basal ganglia.   Chest 1 View Portable - pending 07/23/2019  2D Echocardiogram  1. The left ventricle has normal systolic function with an ejection fraction of 60-65%. The cavity size was normal. Left ventricular diastolic parameters were normal.  2. The right ventricle has normal systolic function. The cavity was normal. There is no increase in right ventricular wall thickness.  3. The pericardial effusion is circumferential.  4. Trivial pericardial effusion is present.  5. The mitral valve is grossly normal.  6. The tricuspid valve is grossly normal.  7. The aortic valve is tricuspid. No stenosis of the aortic valve.  8. The aorta is normal unless otherwise noted.  9. The aortic root is normal in size and structure. 10. No cardiac source of embolism identified. 11. When compared to the prior study: No comparison.  LE Dopplers Right: Findings consistent with acute deep vein thrombosis involving the right peroneal veins. Left: Findings consistent with acute deep vein thrombosis involving the left popliteal vein, left posterior tibial veins, and left peroneal veins.  PHYSICAL EXAM   Temp:  [98.8 F (37.1 C)-100 F (37.8 C)] 98.8 F (37.1 C) (08/29 1000) Pulse Rate:  [68-95] 95 (08/29 1057) Resp:  [11-37] 37 (08/29 1057) BP: (116-142)/(66-89) 138/79 (08/29 1057) SpO2:  [97 %-100 %] 100 % (08/29 1057) FiO2 (%):  [30 %] 30 % (08/29 1057) Weight:  [101.6 kg] 101.6 kg (08/29 0500)  General - Well  nourished, well developed, intubated not on sedation.  Ophthalmologic - fundi not visualized due to noncooperation.  Cardiovascular - Regular rate and rhythm.  Neuro - intubated not on sedation, eyes close, +following commands on the right. With forced eye opening, eyes in right gaze position, not blinking to visual threat, doll's eyes sluggish, not tracking, PERRL. Corneal reflex present weakly bilaterally, gag and cough  present. Breathing over the vent, weaning.  Facial symmetry not able to test due to ET tube.  Tongue protrusion not cooperative. Has spontaneous movement of RUE and RLE. On pain stimulation, mild withdraw of RUE and RLE. But no movement of LUE and LLE. DTR 1+ and no babinski. Sensation, coordination and gait not tested.   ASSESSMENT/PLAN [name] is a 43 y.o. male with no significant past medical history found down x 2 days nonverbal with L hemiparesis.   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 Trace L midline shift. ELVO at R ICA, R A1, R MCA.  MRI  Large R brain infarct sparing R PCA. Cytotoxic edema but no hemorrhage. Stable trace midline shift.  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 R ICA terminus, R ACA, R MCA occluded.   CT head similar w/ low density and sweddling R ACA and MCA territories, now with 5-49mm midline shift. no hemorrhage  CT head acute R ACA and MCA infarct w/ progressive swelling decompressed through craniectomy defect.   CTA head and neck stable MLS. R ICA occlusion in neck that continues into R ACA and MCA. Mild petechial hemorrhage at basal ganglia  CT Head 8/29 - Unchanged appearance of massive right MCA and ACA territory infarcts with parenchyma extending through decompressive craniectomy. Small focus of suspected hemorrhage adjacent to the right caudate head.  2D Echo  EF 60-65%  LE venous doppler DVT in R peroneal, L popliteal, L posterior tibial and L peroneal  May consider TEE later once stable  LDL 83  HgbA1c 5.4  UDS positive THC  Hypercoagulable and autoimmune work up pending (CRP 19.2)  Heparin subq for VTE prophylaxis  No antithrombotic prior to admission, now on aspirin 325mg  daily.   Therapy recommendations:  pending   Disposition:  pending   Cyctotoxic cerebral edema  S/p R decompressive hemicraniectomy (Nundkumar) w/ flap R abd 07/19/2019  On 3% saline @ 75  PICC placed  Na 146->148->154->155->157->161->159  Given One dose of 23.4% 8/26  Goal Na 150-160  ?? Right ICA dissection  MRA and CTA showed right ICA occlusion from origin to terminal  Wife denies any head trauma  Wife stated that pt had recent aggressive exercise with weight lifting  Concerning dissection as working diagnosis of stroke etiology   On ASA   B LE DVT  LE venous doppler DVT in R peroneal, L popliteal, L posterior tibial and L peroneal  Etiology unclear  Not a candidate for [facility] given large stroke and risk of hemorrhage  IVC filter placed 8/27  Hypercoagulable work up pending (CRP 19.2)  D/c SCDs  Acute Respiratory Failure with inadequate airway protection  Intubated for airway protection  CCM on board   On vent  Off sedation, propofol  Seizure-like activity  Continue Keppra   EEG continuous slowing, excessive beta activity, sleep spindle asymmetry decreased R->related to stroke and sedation. No SZ  Long Term EEG cortical dysfunction in right frontal region  Seizure precautions  Febrile, Leukocytosis  Tmax - 103.1->101.4->101.8->100.9->100->99.5  WBC - 16.7->12.0->10.3  Vomited PTA, could be due to aspiration  CXR NAD, repeat CXR yest unchanged x temp probe. Today's (07/23/19) - pending     Hyperlipidemia  Home meds:  no statin  LDL 83, goal < 70  Add lipitor  20 mg daily   Continue statin at  discharge  Dysphagia . Secondary to stroke . NPO . On TF @ 20  Other Stroke Risk Factors  Former Cigarette smoker, quit 3 yrs ago  ETOH use  Substance abuse UDS - positive for THC  Obesity, Body mass index is 31.24 kg/m., recommend weight loss, diet and exercise as appropriate   Other Active Problems  At risk for rhabdo CK 7533 -> 5,972. Hydrate as able. LDH 359. CRP 19.2.    Hypophosphatemia, hypomagnesemia - replace, check -> Phos 3.8 (normal) ; Mg 2.2 (normal)  LFTs - AST - 132 (H) ; ALT - 68 (H)  Hypokalemia - 3.5->3.4 - supplement  Hospital day # 4  This patient is critically ill due to large right MCA and ACA infarct, s/p hemicrani, seizure, cerebral edema and at significant risk of neurological worsening, death form brain herniation, malignant cerebral edema, status epilepticus, respiratory failure, sepsis. This patient's care requires constant monitoring of vital signs, hemodynamics, respiratory and cardiac monitoring, review of multiple databases, neurological assessment, discussion with family, other specialists and medical decision making of high complexity.  Personally examined patient and images, and have participated in and made any corrections needed to history, physical, neuro exam,assessment and plan as stated above.  I have personally obtained the history, evaluated lab date, reviewed imaging studies and agree with radiology interpretations.    Sarina Ill, MD Stroke Neurology    To contact Stroke Continuity provider, please refer to [email or web address] After hours, contact General Neurology

## 2019-07-23 NOTE — Progress Notes (Signed)
CRITICAL VALUE ALERT  Critical Value:  Na 161 Provider Notified: Rory Percy 0203  Orders Received/Actions taken: 3% turned off. Next Na check in 6 hrs.

## 2019-07-24 LAB — GLUCOSE, CAPILLARY
Glucose-Capillary: 96 mg/dL (ref 70–99)
Glucose-Capillary: 107 mg/dL — ABNORMAL HIGH (ref 70–99)
Glucose-Capillary: 120 mg/dL — ABNORMAL HIGH (ref 70–99)
Glucose-Capillary: 115 mg/dL — ABNORMAL HIGH (ref 70–99)
Glucose-Capillary: 117 mg/dL — ABNORMAL HIGH (ref 70–99)
Glucose-Capillary: 114 mg/dL — ABNORMAL HIGH (ref 70–99)

## 2019-07-24 LAB — CULTURE, BLOOD (ROUTINE X 2)
Culture: NO GROWTH
Culture: NO GROWTH
Special Requests: ADEQUATE

## 2019-07-24 LAB — BASIC METABOLIC PANEL
Anion gap: 12 (ref 5–15)
BUN: 20 mg/dL (ref 6–20)
CO2: 24 mmol/L (ref 22–32)
Calcium: 8.6 mg/dL — ABNORMAL LOW (ref 8.9–10.3)
Chloride: 115 mmol/L — ABNORMAL HIGH (ref 98–111)
Creatinine, Ser: 0.92 mg/dL (ref 0.61–1.24)
GFR calc Af Amer: >60 mL/min (ref >60)
GFR calc non Af Amer: >60 mL/min (ref >60)
Glucose, Bld: 117 mg/dL — ABNORMAL HIGH (ref 70–99)
Potassium: 3.6 mmol/L (ref 3.5–5.1)
Sodium: 151 mmol/L — ABNORMAL HIGH (ref 135–145)

## 2019-07-24 LAB — CBC
HCT: 34.3 % — ABNORMAL LOW (ref 39.0–52.0)
Hemoglobin: 11.2 g/dL — ABNORMAL LOW (ref 13.0–17.0)
MCH: 29.1 pg (ref 26.0–34.0)
MCHC: 32.7 g/dL (ref 30.0–36.0)
MCV: 89.1 fL (ref 80.0–100.0)
Platelets: 150 10*3/uL (ref 150–400)
RBC: 3.85 MIL/uL — ABNORMAL LOW (ref 4.22–5.81)
RDW: 15.3 % (ref 11.5–15.5)
WBC: 11.4 10*3/uL — ABNORMAL HIGH (ref 4.0–10.5)
nRBC: 0 % (ref 0.0–0.2)

## 2019-07-24 LAB — CK: Total CK: 4028 U/L — ABNORMAL HIGH (ref 49–397)

## 2019-07-24 LAB — SODIUM
Sodium: 153 mmol/L — ABNORMAL HIGH (ref 135–145)
Sodium: 150 mmol/L — ABNORMAL HIGH (ref 135–145)

## 2019-07-24 MED ORDER — CHLORHEXIDINE GLUCONATE CLOTH 2 % EX PADS
6.0000 | MEDICATED_PAD | Freq: Every day | CUTANEOUS | Status: DC
  Administered 2019-07-24 – 2019-07-26 (×2): 6 via TOPICAL

## 2019-07-24 NOTE — Progress Notes (Signed)
OT NOTE  Spoke with wife and updated that therapy started today. Pt lives in a two story house with 11 steps to enter with wife and 2 children (son/daughter). Wife is not working currently and able to give assistance.   Wife provided pen and paper to help write down information provided by staff. Wife states "this is very helpful to understand"  Jeri Modena, OTR/L  Acute Rehabilitation Services Pager: [phone] Office: [phone] .

## 2019-07-24 NOTE — Evaluation (Signed)
Physical Therapy Evaluation Patient Details Name: Matthew Black MRN: [id number] DOB: 26-Mar-1976 Today's Date: 07/24/2019   History of Present Illness  Pt is a 43 y.o. M with no known PMH who presents on 8/25 after being found down at work, nonverbal with L hemiparesis. CT head showing large R hemisphere infarct with edema and mild mass effect. 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 Stable trace midline shift. S/p hemicraniectomy with abdominal flap implant. BLE DVT vasc US 8/28: acute DVT bilateral lower extremities, s/p IVC placement.   Clinical Impression  Pt admitted with above diagnosis. Pt evaluated on vent, 30% FiO2, 4 PEEP. Pt keeping eyes closed throughout session, following some motor commands on right side. Requiring two person total assist with all aspects of bed mobility. Displays left sided hemiparesis, poor sitting balance, decreased cognition. Will need post acute rehab upon discharge. Will continue to progress as tolerated.      Follow Up Recommendations CIR;Supervision/Assistance - 24 hour (pending medical improvement)    Equipment Recommendations  Other (comment)(TBA)    Recommendations for Other Services Rehab consult     Precautions / Restrictions Precautions Precautions: Fall;Other (comment) Precaution Comments: Intubated, abd flap, L hemiparesis  Restrictions Weight Bearing Restrictions: No      Mobility  Bed Mobility Overal bed mobility: Needs Assistance Bed Mobility: Supine to Sit;Sit to Supine     Supine to sit: Total assist;+2 for physical assistance Sit to supine: Total assist;+2 for physical assistance   General bed mobility comments: totalA + 2 for all aspects of bed mobility  Transfers                 General transfer comment: unable  Ambulation/Gait                Stairs            Wheelchair Mobility    Modified Rankin (Stroke Patients Only) Modified Rankin (Stroke  Patients Only) Pre-Morbid Rankin Score: No symptoms Modified Rankin: Severe disability     Balance Overall balance assessment: Needs assistance Sitting-balance support: Feet supported;Single extremity supported Sitting balance-Leahy Scale: Zero Sitting balance - Comments: No balance reactions noted                                     Pertinent Vitals/Pain Pain Assessment: Faces Faces Pain Scale: Hurts a little bit Pain Location: grimacing with stimuli to face  Pain Descriptors / Indicators: Grimacing Pain Intervention(s): Monitored during session    Home Living Family/patient expects to be discharged to:: Unsure                      Prior Function Level of Independence: Independent         Comments: Working     Journalist, newspaper        Extremity/Trunk Assessment   Upper Extremity Assessment Upper Extremity Assessment: Defer to OT evaluation    Lower Extremity Assessment Lower Extremity Assessment: RLE deficits/detail;LLE deficits/detail RLE Deficits / Details: Moving spontaneously LLE Deficits / Details: Autonomic movement noted with coughing, otherwise 0/5       Communication   Communication: Other (comment)(intubated, not attempting to mouth words)  Cognition Arousal/Alertness: Lethargic Behavior During Therapy: Flat affect Overall Cognitive Status: Impaired/Different from baseline Area of Impairment: Following commands  Following Commands: Follows one step commands inconsistently       General Comments: Keeping eyes closed throughout session, following some motor commands on right side.       General Comments  30% FiO2, 4 PEEP. Pt coughing on return to supine with subsequent increased MVe, rescue breath given and RN notified.    Exercises     Assessment/Plan    PT Assessment Patient needs continued PT services  PT Problem List Decreased strength;Decreased range of motion;Decreased activity  tolerance;Decreased balance;Decreased mobility;Decreased coordination;Decreased cognition;Impaired sensation       PT Treatment Interventions Gait training;Functional mobility training;Therapeutic activities;Therapeutic exercise;Balance training;Patient/family education    PT Goals (Current goals can be found in the Care Plan section)  Acute Rehab PT Goals Patient Stated Goal: unable PT Goal Formulation: Patient unable to participate in goal setting Time For Goal Achievement: 08/07/19 Potential to Achieve Goals: Fair    Frequency Min 3X/week   Barriers to discharge        Co-evaluation PT/OT/SLP Co-Evaluation/Treatment: Yes Reason for Co-Treatment: Complexity of the patient's impairments (multi-system involvement);Necessary to address cognition/behavior during functional activity;For patient/therapist safety;To address functional/ADL transfers           AM-PAC PT "6 Clicks" Mobility  Outcome Measure Help needed turning from your back to your side while in a flat bed without using bedrails?: Total Help needed moving from lying on your back to sitting on the side of a flat bed without using bedrails?: Total Help needed moving to and from a bed to a chair (including a wheelchair)?: Total Help needed standing up from a chair using your arms (e.g., wheelchair or bedside chair)?: Total Help needed to walk in hospital room?: Total Help needed climbing 3-5 steps with a railing? : Total 6 Click Score: 6    End of Session Equipment Utilized During Treatment: Other (comment)(vent) Activity Tolerance: Patient limited by lethargy Patient left: in bed;with call bell/phone within reach;with restraints reapplied Nurse Communication: Mobility status PT Visit Diagnosis: Hemiplegia and hemiparesis;Other symptoms and signs involving the nervous system (R29.898);Other abnormalities of gait and mobility (R26.89) Hemiplegia - Right/Left: Left Hemiplegia - dominant/non-dominant:  Non-dominant Hemiplegia - caused by: Cerebral infarction    [id number] PT Time Calculation (min) (ACUTE ONLY): 21 min   Charges:   PT Evaluation $PT Eval High Complexity: 1 High          Ellamae Sia, PT, DPT Acute Rehabilitation Services Pager [phone] Office [phone]   Willy Eddy 07/24/2019, 10:24 AM

## 2019-07-24 NOTE — Evaluation (Signed)
Occupational Therapy Evaluation Patient Details Name: Matthew Black MRN: [id number] DOB: 15-Jan-1976 Today's Date: 07/24/2019    History of Present Illness Pt is a 43 y.o. M with no known PMH who presents on 8/25 after being found down at work, nonverbal with L hemiparesis. CT head showing large R hemisphere infarct with edema and mild mass effect. 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 Stable trace midline shift. S/p hemicraniectomy with abdominal flap implant. BLE DVT vasc US 8/28: acute DVT bilateral lower extremities, s/p IVC placement.    Clinical Impression   Patient is s/p R hemicraniectomy surgery resulting in functional limitations due to the deficits listed below (see OT problem list). Pt currently on vent and demonstrates L hemiplegia. Pt following command on R hand to squeeze and R LE to kick. Pt does not visually attend and keeping eyes closed.  Patient will benefit from skilled OT acutely to increase independence and safety with ADLS to allow discharge CIR (pending process).     Follow Up Recommendations  CIR    Equipment Recommendations  Other (comment)(TBA)    Recommendations for Other Services Rehab consult(pending process but start following)     Precautions / Restrictions Precautions Precautions: Fall;Other (comment) Precaution Comments: Intubated, abd flap, L hemiparesis  Restrictions Weight Bearing Restrictions: No      Mobility Bed Mobility Overal bed mobility: Needs Assistance Bed Mobility: Supine to Sit;Sit to Supine     Supine to sit: Total assist;+2 for physical assistance Sit to supine: Total assist;+2 for physical assistance   General bed mobility comments: totalA + 2 for all aspects of bed mobility  Transfers                 General transfer comment: unable    Balance Overall balance assessment: Needs assistance Sitting-balance support: Feet supported;Single extremity supported Sitting  balance-Leahy Scale: Zero Sitting balance - Comments: No balance reactions noted                                   ADL either performed or assessed with clinical judgement   ADL Overall ADL's : Needs assistance/impaired                                       General ADL Comments: total (A) at this time. pt with startle to tactile input and no visual attention to task.      Vision   Additional Comments: washing face and making sure that no eye secreations prevent ability to open eyes. OT opening patients eyes with eyes noted to drift     Perception     Praxis      Pertinent Vitals/Pain Pain Assessment: Faces Faces Pain Scale: Hurts a little bit Pain Location: grimacing with stimuli to face  Pain Descriptors / Indicators: Grimacing Pain Intervention(s): Monitored during session;Repositioned     Hand Dominance (unknown but moving R hand to command)   Extremity/Trunk Assessment Upper Extremity Assessment Upper Extremity Assessment: RUE deficits/detail;LUE deficits/detail RUE Deficits / Details: moves hand to tactile input and auditory command LUE Deficits / Details: flaccid at this time   Lower Extremity Assessment Lower Extremity Assessment: Defer to PT evaluation RLE Deficits / Details: Moving spontaneously LLE Deficits / Details: Autonomic movement noted with coughing, otherwise 0/5   Cervical / Trunk  Assessment Cervical / Trunk Exceptions: not activation or core noted during transfer   Communication Communication Communication: Other (comment)(intubated )   Cognition Arousal/Alertness: Lethargic Behavior During Therapy: Flat affect Overall Cognitive Status: Difficult to assess Area of Impairment: Following commands                       Following Commands: Follows one step commands inconsistently       General Comments: Keeping eyes closed throughout session, following some motor commands on right side.    General  Comments       Exercises     Shoulder Instructions      Home Living Family/patient expects to be discharged to:: Unsure                                 Additional Comments: list wife in chart       Prior Functioning/Environment Level of Independence: Independent        Comments: working        OT Problem List: Decreased strength;Decreased range of motion;Decreased activity tolerance;Impaired balance (sitting and/or standing);Impaired vision/perception;Decreased coordination;Decreased cognition;Decreased safety awareness;Decreased knowledge of use of DME or AE;Decreased knowledge of precautions;Impaired sensation;Impaired UE functional use      OT Treatment/Interventions: Self-care/ADL training;Therapeutic exercise;Neuromuscular education;Energy conservation;DME and/or AE instruction;Manual therapy;Modalities;Splinting;Therapeutic activities;Patient/family education;Balance training    OT Goals(Current goals can be found in the care plan section) Acute Rehab OT Goals Patient Stated Goal: unable OT Goal Formulation: Patient unable to participate in goal setting Time For Goal Achievement: 08/07/19 Potential to Achieve Goals: Fair  OT Frequency: Min 2X/week   Barriers to D/C:            Co-evaluation PT/OT/SLP Co-Evaluation/Treatment: Yes Reason for Co-Treatment: Complexity of the patient's impairments (multi-system involvement);Necessary to address cognition/behavior during functional activity;For patient/therapist safety;To address functional/ADL transfers   OT goals addressed during session: ADL's and self-care;Proper use of Adaptive equipment and DME;Strengthening/ROM      AM-PAC OT "6 Clicks" Daily Activity     Outcome Measure Help from another person eating meals?: Total Help from another person taking care of personal grooming?: Total Help from another person toileting, which includes using toliet, bedpan, or urinal?: Total Help from another  person bathing (including washing, rinsing, drying)?: Total Help from another person to put on and taking off regular upper body clothing?: Total Help from another person to put on and taking off regular lower body clothing?: Total 6 Click Score: 6   End of Session Nurse Communication: Mobility status;Precautions  Activity Tolerance: Patient tolerated treatment well Patient left: in bed;with call bell/phone within reach  OT Visit Diagnosis: Unsteadiness on feet (R26.81);Muscle weakness (generalized) (M62.81);Hemiplegia and hemiparesis Hemiplegia - Right/Left: Left Hemiplegia - dominant/non-dominant: Non-Dominant Hemiplegia - caused by: Nontraumatic intracerebral hemorrhage                Time: [id number] OT Time Calculation (min): 21 min Charges:  OT General Charges $OT Visit: 1 Visit OT Evaluation $OT Eval Moderate Complexity: 1 Mod   Jeri Modena, OTR/L  Acute Rehabilitation Services Pager: [phone] Office: [phone] .   Jeri Modena 07/24/2019, 11:40 AM

## 2019-07-24 NOTE — Plan of Care (Signed)
  Problem: Education: Goal: Knowledge of disease or condition will improve Outcome: Not Progressing   

## 2019-07-24 NOTE — Progress Notes (Signed)
NAME:  Matthew Black, MRN:  [id number], DOB:  Sep 04, 1976, LOS: 5 ADMISSION DATE:  07/19/2019, CONSULTATION DATE:  8/25 REFERRING MD:  EDP, CHIEF COMPLAINT:  Stroke   Brief History   43yo male with no known PMH presented 8/25 after being found down at work at [employer] by a coworker covered in vomit and urine.  Last seen normal by his wife 830am 8/24 before he left for work.  CT head showed large R hemisphere infarct with edema and mild mass effect. He was admitted by neurology, placed on 3% saline and PCCM asked to consult for ICU assistance.    Past Medical History  None.  Significant Hospital Events   8/25 > admit. To OR for decompressive crani; returned on vent  8/26: Sedated, had possible seizure activity earlier today involving rhythmic movement of the right hand/wrist and right leg currently on propofol infusion 8/27: following commands on R and some withdraw to pain on LLE. Not opening eyes. Remains on some propofol. Will add fentanyl prn. IVC filter placed late in day 8/27 8/28:   Consults:  PCCM.  Procedures:  8/26: Picc>> 8/26 ETT>> 8/27 IVC filter  Significant Diagnostic Tests:  MR brain 8/25>>> The right ICA is occluded from its origin. The right ICA terminus, right ACA and MCA also appear occluded.  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 Cytotoxic edema but no convincing hemorrhage. Stable mild mass effect with trace leftward midline shift.  CT head 8/26:  cerebral edema decompressing thru the craniotomy  Echo 8/25 > normal LVEF 65% CTA head neck 8/27: 1. Stable from prior MRA. 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 No evidence of atherosclerosis or vasculopathy in the other vessels. 2. Cytotoxic edema causes 5 mm of midline shift and brain bulging through the craniectomy defect. Mild petechial hemorrhage is seen at the basal ganglia. BLE DVT vasc US 8/28: acute DVT R peroneal vein, L  popliteal vein, L posterior tibial vein, L peroneal veins  Micro Data:  COVID 8/25 > negative BCx 8/25> no growth to date   Antimicrobials:  None.  Interim history/subjective:  No overnight events. Tolerating SBT this a.m.  Objective   Blood pressure 123/69, pulse 85, temperature 99.3 F (37.4 C), resp. rate 19, height 5\' 11"  (1.803 m), weight 104.9 kg, SpO2 100 %.    Vent Mode: PRVC FiO2 (%):  [30 %] 30 % Set Rate:  [14 bmp] 14 bmp Vt Set:  [600 mL] 600 mL PEEP:  [5 cmH20] 5 cmH20 Plateau Pressure:  [14 cmH20-33 cmH20] 15 cmH20   Intake/Output Summary (Last 24 hours) at 07/24/2019 0812 Last data filed at 07/24/2019 0700 Gross per 24 hour  Intake 1805.66 ml  Output 2575 ml  Net -769.34 ml   Filed Weights   07/22/19 0500 07/23/19 0500 07/24/19 0500  Weight: 101.8 kg 101.6 kg 104.9 kg    Examination: General-Critically ill appearing adult M, intubated NAD  HEENT- R craniectomy dressing c/d/i. Pink mmm, ETT secure, OGT secure, trachea midline. scleral icterus  Pulmonary: CTA bilaterally. Symmetrical chest expansion, No accessory muscle use  Cardiac: RRR s1s2 no rgm. Cap refill < 2 sec BUE BLE.  Abdomen: soft, flat, ndnt.  GU: Yellow, clear urine collecting via condom cath  Neuro: does not open eyes to command. Right deviant gaze, no nystagmus, unable to track left. PERRL. Shoulder shrug R but not L; will not stick tongue out. Moves right sided  extremities spontaneously and to command (thumbs up, wiggles toes). No w/d to noxious stim or spont. Movement on left this a.m. Skin: clean dry warm without rash.  Assessment & Plan:   Acute respiratory failure requiring mechanical ventilation, in setting of inadequate airway protection -in setting of CVA below  Plan -Continue MV, wean, daily SBT -Continue VAP bundle and PAD protocol for RASS 0 to -1 -add chest physiotherapy to improve atalectasis observed on recent CXR -some concern for airway protection but if does well with  SBT can still consider extubation    Large right hemispheric infarct (ICA,MCA & ACA occlusion) s/p right decompressive hemicraniectomy 8/25 - unclear etiology at this point. Possible dissection per neuro ->Covid: neg ->CRP and LDH slighty elevated ->CT brain 8/26 showing cerebral edema decompressing thru the craniotomy  Cytotoxic cerebral edema Plan -Post-crani care per NSGY -CVA per Neuro -Na goal 150-155 per neuro, 3% via PICC per neuro  -Serial sodium checks  -Follow up imaging per neuro  Possible seizure  LTM did not show seizure activity. Cortical dysfunction of R frontal region, likely 2/2 CVA and cerebral edema Plan -Continue Keppra  -LTM discontinued per neuro   Acute encephalopathy  -Minimize sedation as safely possible, RASS goal 0 to -1 -RN ICU delirium precaution bundle   DVT -acute DVT R peroneal vein, L popliteal vein, L posterior tibial vein, L peroneal veins PLAN: -IVC filter placed 8/27 by IR  Continue to monitor -anticoagulation risk post op for now, but should consider  At risk for abdominal rhabdo -total CK 7533->7768 -> 7224-->5972--> Plan -Continue to trend CK -Check urine myoglobin  -Continue to hydrate as able  -Renal function remains stable (Cr 0.9) -Trend BMP,   Malnutrition, at risk Hypophosphatemia  PLAN:  - replete electrolytes as needed,  --continue tube feeds, monitor TP, albumin   Best practice:  Diet:  tube feeds Pain/Anxiety/Delirium protocol (if indicated): fentanyl, prop VAP protocol (if indicated): ongoing DVT prophylaxis: Per primary. GI prophylaxis: N/A. Glucose control: monitor. No SSI indicated at this time  Mobility: Bedrest. Code Status: Full. Family Communication: Per primary  Disposition:  ICU.  CRITICAL CARE  Total critical care time: 32 minutes  Critical care time was exclusive of separately billable procedures and treating other patients. Critical care was necessary to treat or prevent imminent or  life-threatening deterioration.  Critical care was time spent personally by me on the following activities: development of treatment plan with patient and/or surrogate as well as nursing, discussions with consultants, evaluation of patient's response to treatment, examination of patient, obtaining history from patient or surrogate, ordering and performing treatments and interventions, ordering and review of laboratory studies, ordering and review of radiographic studies, pulse oximetry and re-evaluation of patient's condition.  Bonna Gains, MD PhD 07/24/2019, 8:22 AM

## 2019-07-24 NOTE — Plan of Care (Signed)
  Problem: Coping: Goal: Will identify appropriate support needs Outcome: Progressing

## 2019-07-24 NOTE — Progress Notes (Signed)
Paged Dr. Jaynee Eagles re: sodium 150. Verbal to draw next sodium with 8/31 AM labs. Do not restart 3%.

## 2019-07-24 NOTE — Progress Notes (Signed)
STROKE TEAM PROGRESS NOTE   INTERVAL HISTORY Neuro stable. Pt still intubated  not on sedation. As per RN, with sedation off, pt can be restless or agitated so the propofol has to be back on. Follows commands on the right and able to hold right up in the air moving right side spontaneously, no movement on the left. Found to have DVT b/l LEs, s/p IVC filter yesterday afternoon.   Vitals:   07/24/19 1300 07/24/19 1400 07/24/19 1500 07/24/19 1600  BP: 122/70 130/75 135/79 131/78  Pulse: 71 82 87 84  Resp: (!) 21 (!) 24 (!) 22 (!) 24  Temp: 99.3 F (37.4 C) 99.5 F (37.5 C) 99.9 F (37.7 C) 100.2 F (37.9 C)  TempSrc:      SpO2: 99% 99% 100% 100%  Weight:      Height:        CBC:  Recent Labs  Lab 07/19/19 1040  07/23/19 0459 07/24/19 0510  WBC 16.7*   < > 10.3 11.4*  NEUTROABS 14.2*  --   --   --   HGB 16.8   < > 11.0* 11.2*  HCT 50.2   < > 35.1* 34.3*  MCV 85.4   < > 90.0 89.1  PLT 275   < > 146* 150   < > = values in this interval not displayed.    Basic Metabolic Panel:  Recent Labs  Lab 07/21/19 2131  07/23/19 0459  07/24/19 0510 07/24/19 1426  NA 155*   < > 159*   < > 151* 150*  K  --    < > 3.4*  --  3.6  --   CL  --    < > 127*  --  115*  --   CO2  --    < > 24  --  24  --   GLUCOSE  --    < > 130*  --  117*  --   BUN  --    < > 17  --  20  --   CREATININE  --    < > 0.97  --  0.92  --   CALCIUM  --    < > 8.3*  --  8.6*  --   MG 2.2  --  2.2  --   --   --   PHOS 1.8*  --  3.8  --   --   --    < > = values in this interval not displayed.   Lipid Panel:     Component Value Date/Time   CHOL 140 07/20/2019 0158   TRIG 151 (H) 07/23/2019 0115   HDL 40 (L) 07/20/2019 0158   CHOLHDL 3.5 07/20/2019 0158   VLDL 17 07/20/2019 0158   LDLCALC 83 07/20/2019 0158   HgbA1c:  Lab Results  Component Value Date   HGBA1C 5.4 07/20/2019   Urine Drug Screen:     Component Value Date/Time   LABOPIA NONE DETECTED 07/19/2019 1348   COCAINSCRNUR NONE DETECTED  07/19/2019 1348   LABBENZ NONE DETECTED 07/19/2019 1348   AMPHETMU NONE DETECTED 07/19/2019 1348   THCU POSITIVE (A) 07/19/2019 1348   LABBARB NONE DETECTED 07/19/2019 1348    Alcohol Level     Component Value Date/Time   ETH <10 07/19/2019 1040    IMAGING  Ct Head Wo Contrast Ct Cervical Spine Wo Contrast  07/19/2019 1132 1. 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 2.  No associated hemorrhage and mild intracranial mass effect at this time, including trace leftward midline shift. 3. Evidence of large vessel occlusion: Hyperdensity of the right ICA terminus, the right A1 and MCA. 4. Unaffected brain parenchyma appears negative. 5.  No acute traumatic injury identified in the cervical spine.   Mr Brain 73 Contrast Mr Angio Head Wo Contrast Mr Angio Neck Wo Contrast 07/19/2019 1330 1. 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 The right ICA terminus, right ACA and MCA also appear occluded. 2. 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 Cytotoxic edema but no convincing hemorrhage. Stable mild mass effect with trace leftward midline shift. 3. Intracranial MRA is degraded by motion artifact, but no other large vessel occlusion is suspected. There appears to be lobes reperfusion of the right PCA. 4. The left hemisphere and posterior fossa brain parenchyma appears normal.   Ct Head Wo Contrast 07/19/2019 1532 Similar appearance to earlier. Low-density and swelling of the right hemisphere in the ACA and MCA territories with mass effect and right-to-left shift of 5-6 mm. No hemorrhagic transformation at this time.   Ct Head Wo Contrast 07/20/2019 0444 1. Acute right ACA and MCA territory infarct with progressive swelling that has decompressed through the craniectomy defect. 2. No acute hemorrhage or new infarction.   CT Head WO Contrast 07/23/2019 IMPRESSION: 1. Unchanged appearance of massive right MCA and  ACA territory infarcts with parenchyma extending through decompressive craniectomy. 2. Small focus of suspected hemorrhage adjacent to the right caudate head.  Ct Angio Head W Or Wo Contrast Ct Angio Neck W Or Wo Contrast 07/21/2019 1. Stable from prior MRA. 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 No evidence of atherosclerosis or vasculopathy in the other vessels. 2. Cytotoxic edema causes 5 mm of midline shift and brain bulging through the craniectomy defect. Mild petechial hemorrhage is seen at the basal ganglia.   Chest 1 View Portable  07/23/2019 IMPRESSION: Worsened atelectasis in the right lower lobe. Right arm PICC tip in the lower right atrium.  2D Echocardiogram  1. The left ventricle has normal systolic function with an ejection fraction of 60-65%. The cavity size was normal. Left ventricular diastolic parameters were normal.  2. The right ventricle has normal systolic function. The cavity was normal. There is no increase in right ventricular wall thickness.  3. The pericardial effusion is circumferential.  4. Trivial pericardial effusion is present.  5. The mitral valve is grossly normal.  6. The tricuspid valve is grossly normal.  7. The aortic valve is tricuspid. No stenosis of the aortic valve.  8. The aorta is normal unless otherwise noted.  9. The aortic root is normal in size and structure. 10. No cardiac source of embolism identified. 11. When compared to the prior study: No comparison.  LE Dopplers Right: Findings consistent with acute deep vein thrombosis involving the right peroneal veins. Left: Findings consistent with acute deep vein thrombosis involving the left popliteal vein, left posterior tibial veins, and left peroneal veins.  PHYSICAL EXAM   Temp:  [99 F (37.2 C)-100.4 F (38 C)] 100.2 F (37.9 C) (08/30 1600) Pulse Rate:  [69-87] 84 (08/30 1600) Resp:  [14-24] 24 (08/30 1600) BP:  (114-135)/(68-86) 131/78 (08/30 1600) SpO2:  [96 %-100 %] 100 % (08/30 1600) FiO2 (%):  [30 %] 30 % (08/30 1127) Weight:  [104.9 kg] 104.9 kg (08/30 0500)  General - Well nourished, well developed, intubated  not on sedation.  Ophthalmologic - fundi not visualized due to noncooperation.  Cardiovascular - Regular rate and rhythm.  Neuro - intubated not on sedation, eyes close, +following commands on the right. With forced eye opening, eyes in right gaze position, not blinking to visual threat, doll's eyes sluggish, not tracking, PERRL. Corneal reflex present weakly bilaterally, gag and cough present. Breathing over the vent, weaning.  Facial symmetry not able to test due to ET tube.  Tongue protrusion not cooperative. Has spontaneous movement of RUE and RLE. On pain stimulation, mild withdraw of RUE and RLE. But no movement of LUE and LLE. DTR 1+ and no babinski. Sensation, coordination and gait not tested.   ASSESSMENT/PLAN [name] is a 43 y.o. male with no significant past medical history found down x 2 days nonverbal with L hemiparesis.   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 Trace L midline shift. ELVO at R ICA, R A1, R MCA.  MRI  Large R brain infarct sparing R PCA. Cytotoxic edema but no hemorrhage. Stable trace midline shift.  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 R ICA terminus, R ACA, R MCA occluded.   CT head similar w/ low density and sweddling R ACA and MCA territories, now with 5-52mm midline shift. no hemorrhage  CT head acute R ACA and MCA infarct w/ progressive swelling decompressed through craniectomy defect.   CTA head and neck stable MLS. R ICA occlusion in neck that continues into R ACA and MCA. Mild petechial hemorrhage at basal ganglia  CT Head 8/29 - Unchanged appearance of massive right  MCA and ACA territory infarcts with parenchyma extending through decompressive craniectomy. Small focus of suspected hemorrhage adjacent to the right caudate head.  2D Echo EF 60-65%  LE venous doppler DVT in R peroneal, L popliteal, L posterior tibial and L peroneal  May consider TEE later once stable  LDL 83  HgbA1c 5.4  UDS positive THC  Hypercoagulable and autoimmune work up pending (CRP 19.2)  Heparin subq for VTE prophylaxis  No antithrombotic prior to admission, now on aspirin 325mg  daily.   Therapy recommendations:  CIR recommended  Disposition:  pending   Cyctotoxic cerebral edema  S/p R decompressive hemicraniectomy (Nundkumar) w/ flap R abd 07/19/2019  On 3% saline @ 75  PICC placed  Na 146->148->154->155->157->161->159->153->151  Given One dose of 23.4% 8/26  Goal Na 150-160  ?? Right ICA dissection  MRA and CTA showed right ICA occlusion from origin to terminal  Wife denies any head trauma  Wife stated that pt had recent aggressive exercise with weight lifting  Concerning dissection as working diagnosis of stroke etiology   On ASA   B LE DVT  LE venous doppler DVT in R peroneal, L popliteal, L posterior tibial and L peroneal  Etiology unclear  Not a candidate for [facility] given large stroke and risk of hemorrhage  IVC filter placed 8/27  Hypercoagulable work up pending (CRP 19.2)  D/c SCDs  Acute Respiratory Failure with inadequate airway protection  Intubated for airway protection  CCM on board   On vent  Off sedation, propofol  Seizure-like activity  Continue Keppra   EEG continuous slowing, excessive beta activity, sleep spindle asymmetry decreased R->related to stroke and sedation. No SZ  Long Term EEG cortical  dysfunction in right frontal region  Seizure precautions  Febrile, Leukocytosis  Tmax - 103.1->101.4->101.8->100.9->100->99.5->99.3  WBC - 16.7->12.0->10.3->11.4  Vomited PTA, could be due to aspiration  CXR  NAD, repeat CXR yest unchanged x temp probe. CXR (07/23/19) -  Worsened atelectasis in the right lower lobe  Hyperlipidemia  Home meds:  no statin  LDL 83, goal < 70  Add lipitor 20 mg daily   Continue statin at discharge  Dysphagia . Secondary to stroke . NPO . On TF @ 20  Other Stroke Risk Factors  Former Cigarette smoker, quit 3 yrs ago  ETOH use  Substance abuse UDS - positive for THC  Obesity, Body mass index is 32.25 kg/m., recommend weight loss, diet and exercise as appropriate   Other Active Problems  At risk for rhabdo CK 7533 -> 5,972. Hydrate as able. LDH 359. CRP 19.2.    Hypophosphatemia, hypomagnesemia - replace, check -> Phos 3.8 (normal) ; Mg 2.2 (normal)  LFTs - AST - 132 (H) ; ALT - 68 (H) - recheck Tuesday  Hypokalemia - 3.5->3.4 - supplement ->3.6  Hospital day # 5  This patient is critically ill due to large right MCA and ACA infarct, s/p hemicrani, seizure, cerebral edema and at significant risk of neurological worsening, death form brain herniation, malignant cerebral edema, status epilepticus, respiratory failure, sepsis. This patient's care requires constant monitoring of vital signs, hemodynamics, respiratory and cardiac monitoring, review of multiple databases, neurological assessment, discussion with family, other specialists and medical decision making of high complexity.  Personally examined patient and images, and have participated in and made any corrections needed to history, physical, neuro exam,assessment and plan as stated above.  I have personally obtained the history, evaluated lab date, reviewed imaging studies and agree with radiology interpretations.       To contact Stroke Continuity provider, please refer to [email or web address] After hours, contact General Neurology

## 2019-07-25 ENCOUNTER — Inpatient Hospital Stay (HOSPITAL_COMMUNITY): Payer: Medicaid Other

## 2019-07-25 DIAGNOSIS — I6300 Cerebral infarction due to thrombosis of unspecified precerebral artery: Secondary | ICD-10-CM

## 2019-07-25 LAB — BASIC METABOLIC PANEL
Anion gap: 9 (ref 5–15)
BUN: 23 mg/dL — ABNORMAL HIGH (ref 6–20)
CO2: 24 mmol/L (ref 22–32)
Calcium: 8.3 mg/dL — ABNORMAL LOW (ref 8.9–10.3)
Chloride: 112 mmol/L — ABNORMAL HIGH (ref 98–111)
Creatinine, Ser: 0.8 mg/dL (ref 0.61–1.24)
GFR calc Af Amer: >60 mL/min (ref >60)
GFR calc non Af Amer: >60 mL/min (ref >60)
Glucose, Bld: 105 mg/dL — ABNORMAL HIGH (ref 70–99)
Potassium: 3.5 mmol/L (ref 3.5–5.1)
Sodium: 145 mmol/L (ref 135–145)

## 2019-07-25 LAB — CBC
HCT: 36 % — ABNORMAL LOW (ref 39.0–52.0)
Hemoglobin: 11.9 g/dL — ABNORMAL LOW (ref 13.0–17.0)
MCH: 29.1 pg (ref 26.0–34.0)
MCHC: 33.1 g/dL (ref 30.0–36.0)
MCV: 88 fL (ref 80.0–100.0)
Platelets: 166 10*3/uL (ref 150–400)
RBC: 4.09 MIL/uL — ABNORMAL LOW (ref 4.22–5.81)
RDW: 14.6 % (ref 11.5–15.5)
WBC: 10.3 10*3/uL (ref 4.0–10.5)
nRBC: 0 % (ref 0.0–0.2)

## 2019-07-25 LAB — GLUCOSE, CAPILLARY
Glucose-Capillary: 100 mg/dL — ABNORMAL HIGH (ref 70–99)
Glucose-Capillary: 108 mg/dL — ABNORMAL HIGH (ref 70–99)
Glucose-Capillary: 121 mg/dL — ABNORMAL HIGH (ref 70–99)
Glucose-Capillary: 118 mg/dL — ABNORMAL HIGH (ref 70–99)
Glucose-Capillary: 94 mg/dL (ref 70–99)
Glucose-Capillary: 106 mg/dL — ABNORMAL HIGH (ref 70–99)

## 2019-07-25 LAB — CK: Total CK: 2311 U/L — ABNORMAL HIGH (ref 49–397)

## 2019-07-25 MED ORDER — CHLORHEXIDINE GLUCONATE 0.12 % MT SOLN
OROMUCOSAL | Status: AC
  Administered 2019-07-25: 15 mL via OROMUCOSAL
  Filled 2019-07-25: qty 15

## 2019-07-25 MED ORDER — SODIUM CHLORIDE 0.9 % IV SOLN
INTRAVENOUS | Status: DC
  Administered 2019-07-25: 16:00:00 via INTRAVENOUS

## 2019-07-25 NOTE — Progress Notes (Signed)
NAME:  Matthew Black, MRN:  [id number], DOB:  1976-09-20, LOS: 6 ADMISSION DATE:  07/19/2019, CONSULTATION DATE:  8/25 REFERRING MD:  EDP, CHIEF COMPLAINT:  Stroke   Brief History   43yo male with no known PMH presented 8/25 after being found down at work at [employer] by a coworker covered in vomit and urine.  Last seen normal by his wife 830am 8/24 before he left for work.  CT head showed large R hemisphere infarct with edema and mild mass effect. He was admitted by neurology, placed on 3% saline and PCCM asked to consult for ICU assistance.    Past Medical History  None.  Significant Hospital Events   8/25 > admit. To OR for decompressive crani; returned on vent  8/26: Sedated, had possible seizure activity earlier today involving rhythmic movement of the right hand/wrist and right leg currently on propofol infusion 8/27: following commands on R and some withdraw to pain on LLE. Not opening eyes. Remains on some propofol. Will add fentanyl prn. IVC filter placed late in day 8/27  Consults:  PCCM.  Procedures:  8/26: Picc>> 8/26 ETT>> 8/27 IVC filter  Significant Diagnostic Tests:  MR brain 8/25>>> The right ICA is occluded from its origin. The right ICA terminus, right ACA and MCA also appear occluded.  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 Cytotoxic edema but no convincing hemorrhage. Stable mild mass effect with trace leftward midline shift.  CT head 8/26:  cerebral edema decompressing thru the craniotomy  Echo 8/25 > normal LVEF 65% CTA head neck 8/27: 1. Stable from prior MRA. 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 No evidence of atherosclerosis or vasculopathy in the other vessels. 2. Cytotoxic edema causes 5 mm of midline shift and brain bulging through the craniectomy defect. Mild petechial hemorrhage is seen at the basal ganglia. BLE DVT vasc US 8/28: acute DVT R peroneal vein, L popliteal  vein, L posterior tibial vein, L peroneal veins  Micro Data:  COVID 8/25 > negative BCx 8/25> no growth to date   Antimicrobials:  None.  Interim history/subjective:  No acute events overnight. Tolerating 5/5 wean   Objective   Blood pressure 124/68, pulse 81, temperature 100 F (37.8 C), temperature source Esophageal, resp. rate (!) 21, height 5\' 11"  (1.803 m), weight 105 kg, SpO2 100 %.    Vent Mode: CPAP;PSV FiO2 (%):  [30 %] 30 % Set Rate:  [14 bmp] 14 bmp Vt Set:  [600 mL] 600 mL PEEP:  [5 cmH20] 5 cmH20 Pressure Support:  [5 cmH20] 5 cmH20 Plateau Pressure:  [16 cmH20-17 cmH20] 16 cmH20   Intake/Output Summary (Last 24 hours) at 07/25/2019 0758 Last data filed at 07/25/2019 0600 Gross per 24 hour  Intake 1189.81 ml  Output 2250 ml  Net -1060.19 ml   Filed Weights   07/23/19 0500 07/24/19 0500 07/25/19 0500  Weight: 101.6 kg 104.9 kg 105 kg    Examination: General:  Middle aged adult male in NAD on vent Neuro: does not open eyes, follows commands on R. No movement to pain on L.  HEENT: Surgical dressing CDI. Cardiovascular:  RRR, no MRG Lungs:  Clear Abdomen:  Soft, non-distended. Palpable bone flap RLQ. Hyperactive BS. Surgical incision CDI.  Musculoskeletal:  No acute deformity Skin:  Intact, MMM  Assessment & Plan:   Acute respiratory failure requiring mechanical ventilation, in setting of inadequate airway protection -in setting of CVA  below  Plan -Vent support with daily SBT - Hopeful for extubation today.  -Continue VAP bundle and PAD protocol for RASS 0 to -1  -Continue chest PT   Large right hemispheric infarct (ICA,MCA & ACA occlusion) s/p right decompressive hemicraniectomy 8/25 - unclear etiology at this point. Possible dissection per neuro ->Covid: neg ->CRP and LDH slighty elevated Cytotoxic cerebral edema Plan - Neurology following - Neurosurgery signed off with recs: staples remove 08/05/19, outpatient follow up. - Na goal 150-155 per  neuro, 3% via PICC per neuro - Serial sodium checks - Follow up imaging per neuro  Possible seizure  LTM did not show seizure activity. Cortical dysfunction of R frontal region, likely 2/2 CVA and cerebral edema Plan - Continue Keppra - Neurology following  Acute encephalopathy  - Minimize sedation as safely possible, RASS goal 0 to -1 - RN ICU delirium precaution bundle   DVT: acute DVT R peroneal vein, L popliteal vein, L posterior tibial vein, L peroneal veins - IVC filter placed 8/27 by IR  - Continue to monitor - will need anticoagulation once safe from a surgical perspective.   At risk for abdominal rhabdomyolysis -total CK trending down.  - Continue to trend CK, Creat - hydrate as tolerated  Malnutrition, at risk Hypophosphatemia  - replete electrolytes as needed,  - continue tube feeds, monitor TP, albumin   Best practice:  Diet:  tube feeds Pain/Anxiety/Delirium protocol (if indicated):  propofol VAP protocol (if indicated): ongoing DVT prophylaxis: Per primary. GI prophylaxis: N/A. Glucose control: Monitor. No SSI indicated at this time  Mobility: Bedrest. Code Status: Full. Family Communication: Per primary  Disposition:  ICU.  Critical care time of 35 minutes required due to acute hypoxemic respiratory failure, acute encephalopathy in the setting of CVA, and cerebral edema.   Georgann Housekeeper, AGACNP-BC [place] Pager [phone] or [phone]  07/25/2019 8:33 AM

## 2019-07-25 NOTE — Progress Notes (Signed)
Orthopedic Tech Progress Note Patient Details:  Matthew Black March 21, 1976 [id number] [facility] for soft helmet.  Patient ID: Matthew Black, male   DOB: [birth year], 43 y.o.   MRN: [id number]   Melony Overly T 07/25/2019, 1:06 PM

## 2019-07-25 NOTE — Progress Notes (Signed)
Rehab Admissions Coordinator Note:  Per OT recommendation patient was screened by Michel Santee for appropriateness for an Inpatient Acute Rehab Consult.  At this time, note pt just extubated today.  Will follow for tolerance and progression before requesting a CIR order.   Michel Santee 07/25/2019, 3:42 PM  I can be reached at [phone] .

## 2019-07-25 NOTE — Procedures (Signed)
Extubation Procedure Note  Patient Details:   Name: SOSA PACIFICO DOB: 1976-09-15 MRN: [id number]   Airway Documentation:    Vent end date: 07/25/19 Vent end time: [id number]   Evaluation  O2 sats: stable throughout Complications: No apparent complications Patient did tolerate procedure well. Bilateral Breath Sounds: Clear, Diminished    RT extubated patient to 5L [place] per MD order with RN at bedside. Positive cuff leak noted. RT and RN expressed small concern for ams but was told by NP, that MD is aware and to continue with extubation. No stridor noted. Patient did not speak when RN asked after extubation and still has not opened his eyes. Patient sating 97% on 5L [place]. RT will continue to monitor as needed.    Vernona Rieger 07/25/2019, 10:52 AM

## 2019-07-25 NOTE — Progress Notes (Signed)
TCD Bubble study has been completed.   Preliminary results in CV Proc.   Matthew Black 07/25/2019 3:54 PM

## 2019-07-25 NOTE — Progress Notes (Signed)
STROKE TEAM PROGRESS NOTE   INTERVAL HISTORY Patient extubated this a.m.  Is tolerating it well so far.  Is drowsy with eyes closed but can be aroused and follows commands consistently on the right side.  Left-sided neglect, visual field loss and dense hemiplegia persist.  Blood pressure adequately controlled.  Serum sodium is down to 145.  Vitals:   07/25/19 0745 07/25/19 0800 07/25/19 1047 07/25/19 1200  BP: 134/77 134/77 127/74 (!) 142/83  Pulse: 81 81 94 77  Resp: (!) 21 20 18 17   Temp: 100 F (37.8 C) 100.2 F (37.9 C)  99.7 F (37.6 C)  TempSrc: Esophageal   Axillary  SpO2: 100% 100% 97% 100%  Weight:      Height:        CBC:  Recent Labs  Lab 07/19/19 1040  07/24/19 0510 07/25/19 0517  WBC 16.7*   < > 11.4* 10.3  NEUTROABS 14.2*  --   --   --   HGB 16.8   < > 11.2* 11.9*  HCT 50.2   < > 34.3* 36.0*  MCV 85.4   < > 89.1 88.0  PLT 275   < > 150 166   < > = values in this interval not displayed.    Basic Metabolic Panel:  Recent Labs  Lab 07/21/19 2131  07/23/19 0459  07/24/19 0510 07/24/19 1426 07/25/19 0517  NA 155*   < > 159*   < > 151* 150* 145  K  --    < > 3.4*  --  3.6  --  3.5  CL  --    < > 127*  --  115*  --  112*  CO2  --    < > 24  --  24  --  24  GLUCOSE  --    < > 130*  --  117*  --  105*  BUN  --    < > 17  --  20  --  23*  CREATININE  --    < > 0.97  --  0.92  --  0.80  CALCIUM  --    < > 8.3*  --  8.6*  --  8.3*  MG 2.2  --  2.2  --   --   --   --   PHOS 1.8*  --  3.8  --   --   --   --    < > = values in this interval not displayed.   Lipid Panel:     Component Value Date/Time   CHOL 140 07/20/2019 0158   TRIG 151 (H) 07/23/2019 0115   HDL 40 (L) 07/20/2019 0158   CHOLHDL 3.5 07/20/2019 0158   VLDL 17 07/20/2019 0158   LDLCALC 83 07/20/2019 0158   HgbA1c:  Lab Results  Component Value Date   HGBA1C 5.4 07/20/2019   Urine Drug Screen:     Component Value Date/Time   LABOPIA NONE DETECTED 07/19/2019 1348   COCAINSCRNUR NONE  DETECTED 07/19/2019 1348   LABBENZ NONE DETECTED 07/19/2019 1348   AMPHETMU NONE DETECTED 07/19/2019 1348   THCU POSITIVE (A) 07/19/2019 1348   LABBARB NONE DETECTED 07/19/2019 1348    Alcohol Level     Component Value Date/Time   ETH <10 07/19/2019 1040    IMAGING  No results found.   Ct Head Wo Contrast Ct Cervical Spine Wo Contrast  07/19/2019 1132 1. Large right hemisphere infarct with confluent cytotoxic edema in the right ACA and  MCA territories. 2. No associated hemorrhage and mild intracranial mass effect at this time, including trace leftward midline shift. 3. Evidence of large vessel occlusion: Hyperdensity of the right ICA terminus, the right A1 and MCA. 4. Unaffected brain parenchyma appears negative. 5.  No acute traumatic injury identified in the cervical spine.   Mr Brain 20 Contrast Mr Angio Head Wo Contrast Mr Angio Neck Wo Contrast 07/19/2019 1330 1. 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 The right ICA terminus, right ACA and MCA also appear occluded. 2. 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 Cytotoxic edema but no convincing hemorrhage. Stable mild mass effect with trace leftward midline shift. 3. Intracranial MRA is degraded by motion artifact, but no other large vessel occlusion is suspected. There appears to be lobes reperfusion of the right PCA. 4. The left hemisphere and posterior fossa brain parenchyma appears normal.   Ct Head Wo Contrast 07/19/2019 1532 Similar appearance to earlier. Low-density and swelling of the right hemisphere in the ACA and MCA territories with mass effect and right-to-left shift of 5-6 mm. No hemorrhagic transformation at this time.   Ct Head Wo Contrast 07/20/2019 0444 1. Acute right ACA and MCA territory infarct with progressive swelling that has decompressed through the craniectomy defect. 2. No acute hemorrhage or new infarction.   Ct Angio Head W Or Wo Contrast Ct Angio Neck W Or Wo  Contrast 07/21/2019 1. Stable from prior MRA. 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 No evidence of atherosclerosis or vasculopathy in the other vessels. 2. Cytotoxic edema causes 5 mm of midline shift and brain bulging through the craniectomy defect. Mild petechial hemorrhage is seen at the basal ganglia.   CT Head WO Contrast 07/23/2019 1. Unchanged appearance of massive right MCA and ACA territory infarcts with parenchyma extending through decompressive craniectomy. 2. Small focus of suspected hemorrhage adjacent to the right caudate head.  Chest 1 View Portable  07/23/2019 Worsened atelectasis in the right lower lobe. Right arm PICC tip in the lower right atrium.  2D Echocardiogram  1. The left ventricle has normal systolic function with an ejection fraction of 60-65%. The cavity size was normal. Left ventricular diastolic parameters were normal.  2. The right ventricle has normal systolic function. The cavity was normal. There is no increase in right ventricular wall thickness.  3. The pericardial effusion is circumferential.  4. Trivial pericardial effusion is present.  5. The mitral valve is grossly normal.  6. The tricuspid valve is grossly normal.  7. The aortic valve is tricuspid. No stenosis of the aortic valve.  8. The aorta is normal unless otherwise noted.  9. The aortic root is normal in size and structure. 10. No cardiac source of embolism identified. 11. When compared to the prior study: No comparison.  LE Dopplers Right: Findings consistent with acute deep vein thrombosis involving the right peroneal veins. Left: Findings consistent with acute deep vein thrombosis involving the left popliteal vein, left posterior tibial veins, and left peroneal veins.   PHYSICAL EXAM   General - Well nourished, well developed, middle-aged African-American male.  He has right hemicraniectomy surgical incision on the  scalp. Ophthalmologic - fundi not visualized due to noncooperation. Lungs clear to auscultation. Cardiovascular - Regular rate and rhythm.  Neuro logical Exam-patient is drowsy, eyes close, +following commands on the right. With forced eye opening, eyes in right gaze position, not blinking to visual threat, doll's  eyes sluggish, not tracking, PERRL. Corneal reflex present weakly bilaterally, gag and cough present.  Facial symmetry not able to test due to ET tube.  Tongue protrusion not cooperative. Has spontaneous movement of RUE and RLE. On pain stimulation, mild withdraw of RUE and RLE. But no movement of LUE and LLE. DTR 1+ and no babinski. Sensation, coordination and gait not tested.   ASSESSMENT/PLAN [name] is a 43 y.o. male with no significant past medical history found down x 2 days nonverbal with L hemiparesis.   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 Trace L midline shift. ELVO at R ICA, R A1, R MCA.  MRI  Large R brain infarct sparing R PCA. Cytotoxic edema but no hemorrhage. Stable trace midline shift.  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 R ICA terminus, R ACA, R MCA occluded.   CT head similar w/ low density and sweddling R ACA and MCA territories, now with 5-69mm midline shift. no hemorrhage  CT head acute R ACA and MCA infarct w/ progressive swelling decompressed through craniectomy defect.   CTA head and neck stable MLS. R ICA occlusion in neck that continues into R ACA and MCA. Mild petechial hemorrhage at basal ganglia  CT Head 8/29 - Unchanged appearance of massive right MCA and ACA territory infarcts with parenchyma extending through decompressive craniectomy. Small focus of suspected hemorrhage adjacent to the right caudate head.  2D Echo EF 60-65%  LE venous doppler DVT in R peroneal, L  popliteal, L posterior tibial and L peroneal  TCD bubble pending   May consider TEE later once stable  LDL 83  HgbA1c 5.4  UDS positive THC  Hypercoagulable and autoimmune work up negative- lupus anticoagulant, antiphospholipid antibodies and homocystine are all normal Heparin subq for VTE prophylaxis  No antithrombotic prior to admission, now on aspirin 325mg  daily.   Therapy recommendations:  CIR   Disposition:  pending   Soft Helmet ordered  Cyctotoxic cerebral edema  S/p R decompressive hemicraniectomy (Nundkumar) w/ flap R abd 07/19/2019  On 3% saline @ 75  PICC placed  Given One dose of 23.4% 8/26  3%  off 8/30 1700  Na 146->148->154->155->157->161->159->153->151->150->145  Monitor Na daily  ?? Right ICA dissection  MRA and CTA showed right ICA occlusion from origin to terminal  Wife denies any head trauma  Wife stated that pt had recent aggressive exercise with weight lifting  Concerning dissection as working diagnosis of stroke etiology   On ASA alone.  B LE DVT  LE venous doppler DVT in R peroneal, L popliteal, L posterior tibial and L peroneal  Etiology unclear  Not a candidate for [facility] given large stroke and risk of hemorrhage  IVC filter placed 8/27  Hypercoagulable work up negative   Off SCDs  Acute Respiratory Failure with inadequate airway protection  Intubated for airway protection  CCM on board   On vent  Off sedation, propofol  Possible extubation  Seizure-like activity  Continue Keppra   EEG continuous slowing, excessive beta activity, sleep spindle asymmetry decreased R->related to stroke and sedation. No SZ  Long Term EEG cortical dysfunction in right frontal region  Seizure precautions  Febrile, Leukocytosis  Tmax - 100.8  WBC - 16.7->12.0->10.3->11.4->10.3  Vomited PTA, could be due to aspiration  CXR NAD,  repeat CXR yest unchanged x temp probe. CXR (07/23/19) -  Worsened atelectasis in the right lower  lobe  Hyperlipidemia  Home meds:  no statin  LDL 83, goal < 70  Add lipitor 20 mg daily   Continue statin at discharge  Dysphagia . Secondary to stroke . NPO . On TF @ 20  Other Stroke Risk Factors  Former Cigarette smoker, quit 3 yrs ago  ETOH use  Substance abuse UDS - positive for THC  Obesity, Body mass index is 32.29 kg/m., recommend weight loss, diet and exercise as appropriate   Other Active Problems  At risk for rhabdo CK 7533 -> 5,972. Hydrate as able. LDH 359. CRP 19.2.    Hypophosphatemia, hypomagnesemia - resolved-> Phos 3.8 (normal) ; Mg 2.2 (normal)  LFTs - AST - 132 (H) ; ALT - 68 (H) - recheck Tuesday  Hypokalemia - 3.5->3.4 - supplement ->3.6->3.5  Hospital day # 6 I have personally obtained history,examined this patient, reviewed notes, independently viewed imaging studies, participated in medical decision making and plan of care.ROS completed by me personally and pertinent positives fully documented  I have made any additions or clarifications directly to the above note.  He presented with a large right hemispheric infarct due to right carotid occlusion and underwent hemicraniectomy and is making slow improvement.  He got extubated today and so far doing well.  Recommend continue close neurological monitoring.  Speech therapy to do swallow eval.  If unable to swallow is likely going to need a feeding tube.  Continue aspirin for now.  Check transcranial Doppler bubble study for PFO.  Discussed with patient and wife at the bedside and answered questions about his prognosis and plan for evaluation and treatment.  Discussed with Dr. Kipp Brood critical care medicine.  Will order soft helmet to protect his head hemicraniectomy site.  Mitts for his right hand. This patient is critically ill and at significant risk of neurological worsening, death and care requires constant monitoring of vital signs, hemodynamics,respiratory and cardiac monitoring, extensive  review of multiple databases, frequent neurological assessment, discussion with family, other specialists and medical decision making of high complexity.I have made any additions or clarifications directly to the above note.This critical care time does not reflect procedure time, or teaching time or supervisory time of PA/NP/Med Resident etc but could involve care discussion time.  I spent 30 minutes of neurocritical care time  in the care of  this patient.     Matthew Contras, MD Medical Director [facility] Pager: [phone] 07/25/2019 4:08 PM    To contact Stroke Continuity provider, please refer to [email or web address] After hours, contact General Neurology

## 2019-07-26 LAB — GLUCOSE, CAPILLARY
Glucose-Capillary: 107 mg/dL — ABNORMAL HIGH (ref 70–99)
Glucose-Capillary: 112 mg/dL — ABNORMAL HIGH (ref 70–99)
Glucose-Capillary: 111 mg/dL — ABNORMAL HIGH (ref 70–99)
Glucose-Capillary: 87 mg/dL (ref 70–99)
Glucose-Capillary: 112 mg/dL — ABNORMAL HIGH (ref 70–99)

## 2019-07-26 LAB — HEPATIC FUNCTION PANEL
ALT: 244 U/L — ABNORMAL HIGH (ref 0–44)
AST: 219 U/L — ABNORMAL HIGH (ref 15–41)
Albumin: 3 g/dL — ABNORMAL LOW (ref 3.5–5.0)
Alkaline Phosphatase: 90 U/L (ref 38–126)
Bilirubin, Direct: 0.3 mg/dL — ABNORMAL HIGH (ref 0.0–0.2)
Indirect Bilirubin: 0.8 mg/dL (ref 0.3–0.9)
Total Bilirubin: 1.1 mg/dL (ref 0.3–1.2)
Total Protein: 7.3 g/dL (ref 6.5–8.1)

## 2019-07-26 LAB — BASIC METABOLIC PANEL
Anion gap: 7 (ref 5–15)
BUN: 19 mg/dL (ref 6–20)
CO2: 22 mmol/L (ref 22–32)
Calcium: 8.4 mg/dL — ABNORMAL LOW (ref 8.9–10.3)
Chloride: 117 mmol/L — ABNORMAL HIGH (ref 98–111)
Creatinine, Ser: 0.86 mg/dL (ref 0.61–1.24)
GFR calc Af Amer: >60 mL/min (ref >60)
GFR calc non Af Amer: >60 mL/min (ref >60)
Glucose, Bld: 113 mg/dL — ABNORMAL HIGH (ref 70–99)
Potassium: 3.5 mmol/L (ref 3.5–5.1)
Sodium: 146 mmol/L — ABNORMAL HIGH (ref 135–145)

## 2019-07-26 LAB — CBC
HCT: 38.3 % — ABNORMAL LOW (ref 39.0–52.0)
Hemoglobin: 12.2 g/dL — ABNORMAL LOW (ref 13.0–17.0)
MCH: 28.1 pg (ref 26.0–34.0)
MCHC: 31.9 g/dL (ref 30.0–36.0)
MCV: 88.2 fL (ref 80.0–100.0)
Platelets: 189 10*3/uL (ref 150–400)
RBC: 4.34 MIL/uL (ref 4.22–5.81)
RDW: 13.5 % (ref 11.5–15.5)
WBC: 11.8 10*3/uL — ABNORMAL HIGH (ref 4.0–10.5)
nRBC: 0 % (ref 0.0–0.2)

## 2019-07-26 LAB — CK: Total CK: 1797 U/L — ABNORMAL HIGH (ref 49–397)

## 2019-07-26 LAB — HEPARIN LEVEL (UNFRACTIONATED): Heparin Unfractionated: 0.25 IU/mL — ABNORMAL LOW (ref 0.30–0.70)

## 2019-07-26 MED ORDER — CHLORHEXIDINE GLUCONATE 0.12 % MT SOLN
15.0000 mL | Freq: Two times a day (BID) | OROMUCOSAL | Status: DC
  Administered 2019-07-26 – 2019-09-06 (×84): 15 mL via OROMUCOSAL
  Filled 2019-07-26 (×60): qty 15

## 2019-07-26 MED ORDER — CHLORHEXIDINE GLUCONATE CLOTH 2 % EX PADS
6.0000 | MEDICATED_PAD | Freq: Every day | CUTANEOUS | Status: DC
  Administered 2019-07-26 – 2019-08-03 (×8): 6 via TOPICAL

## 2019-07-26 MED ORDER — ASPIRIN 300 MG RE SUPP
300.0000 mg | Freq: Once | RECTAL | Status: AC
  Administered 2019-07-26: 300 mg via RECTAL
  Filled 2019-07-26: qty 1

## 2019-07-26 MED ORDER — LEVETIRACETAM 100 MG/ML PO SOLN
500.0000 mg | Freq: Two times a day (BID) | ORAL | Status: DC
  Administered 2019-07-26 – 2019-09-05 (×82): 500 mg
  Filled 2019-07-26 (×84): qty 5

## 2019-07-26 MED ORDER — ORAL CARE MOUTH RINSE
15.0000 mL | Freq: Two times a day (BID) | OROMUCOSAL | Status: DC
  Administered 2019-07-26 – 2019-09-06 (×82): 15 mL via OROMUCOSAL

## 2019-07-26 MED ORDER — HEPARIN (PORCINE) 25000 UT/250ML-% IV SOLN
1450.0000 [IU]/h | INTRAVENOUS | Status: DC
  Administered 2019-07-26: 1350 [IU]/h via INTRAVENOUS
  Administered 2019-07-27 – 2019-07-28 (×3): 1400 [IU]/h via INTRAVENOUS
  Administered 2019-07-29: 18:00:00 1300 [IU]/h via INTRAVENOUS
  Administered 2019-07-30: 1450 [IU]/h via INTRAVENOUS
  Filled 2019-07-26 (×6): qty 250

## 2019-07-26 NOTE — Progress Notes (Signed)
NAME:  Matthew Black, MRN:  [id number], DOB:  Apr 02, 1976, LOS: 7 ADMISSION DATE:  07/19/2019, CONSULTATION DATE:  8/25 REFERRING MD:  EDP, CHIEF COMPLAINT:  Stroke   Brief History   43yo male with no known PMH presented 8/25 after being found down at work at [employer] by a coworker covered in vomit and urine.  Last seen normal by his wife 830am 8/24 before he left for work.  CT head showed large R hemisphere infarct with edema and mild mass effect. He was admitted by neurology, placed on 3% saline and PCCM asked to consult for ICU assistance.   Past Medical History  None.  Significant Hospital Events   8/25 > admit. To OR for decompressive crani; returned on vent  8/26: Sedated, had possible seizure activity earlier today involving rhythmic movement of the right hand/wrist and right leg currently on propofol infusion 8/27: following commands on R and some withdraw to pain on LLE. Not opening eyes. Remains on some propofol. Will add fentanyl prn. IVC filter placed late in day 8/27  Consults:  PCCM.  Procedures:  8/26: Picc>> 8/26 ETT>> 8/27 IVC filter  Significant Diagnostic Tests:  MR brain 8/25>>> The right ICA is occluded from its origin. The right ICA terminus, right ACA and MCA also appear occluded.  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 Cytotoxic edema but no convincing hemorrhage. Stable mild mass effect with trace leftward midline shift.  CT head 8/26:  cerebral edema decompressing thru the craniotomy  Echo 8/25 > normal LVEF 65% CTA head neck 8/27: 1. Stable from prior MRA. 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 No evidence of atherosclerosis or vasculopathy in the other vessels. 2. Cytotoxic edema causes 5 mm of midline shift and brain bulging through the craniectomy defect. Mild petechial hemorrhage is seen at the basal ganglia. BLE DVT vasc US 8/28: acute DVT R peroneal vein, L popliteal  vein, L posterior tibial vein, L peroneal veins  Micro Data:  COVID 8/25 > negative BCx 8/25> no growth to date   Antimicrobials:  None.  Interim history/subjective:  Extubated 8/31.  Awaiting core track placement.  Remains nonverbal but following commands consistently.  Objective   Blood pressure (!) 142/85, pulse 72, temperature 99 F (37.2 C), temperature source Axillary, resp. rate 17, height 5\' 11"  (1.803 m), weight 104.8 kg, SpO2 100 %.        Intake/Output Summary (Last 24 hours) at 07/26/2019 0819 Last data filed at 07/26/2019 0800 Gross per 24 hour  Intake 1377.28 ml  Output 2300 ml  Net -922.72 ml   Filed Weights   07/24/19 0500 07/25/19 0500 07/26/19 0500  Weight: 104.9 kg 105 kg 104.8 kg    Examination: General:  Middle aged adult male spontaneous active movement on the right side. Neuro: does not open eyes, follows commands on R.  Withdraws to pain on the left lower extremity minimal flicker to pain left upper extremity HEENT: Surgical dressing CDI. Cardiovascular:  RRR, no MRG, no carotid bruits Lungs:  Clear Abdomen:  Soft, non-distended. Palpable bone flap RLQ. Hyperactive BS. Surgical incision CDI.  Musculoskeletal:  No acute deformity Skin:  Intact, MMM  Assessment & Plan:   Now resolved acute respiratory failure requiring mechanical ventilation, in setting of inadequate airway protection Currently on room air.  Adequate cough -Safe to transfer to floor. -Remains at aspiration risk.  Large right hemispheric infarct (ICA,MCA & ACA occlusion)  s/p right decompressive hemicraniectomy 8/25 Has completed course of hypertonic saline Now 6 days out from event.  Neurological status unlikely to change in intermediate timeframe. -Ready for transfer to progressive care -We will place a core track for feeding -Appropriate to pursue post stroke placement at this time.  Possible seizure  LTM did not show seizure activity. Cortical dysfunction of R frontal region,  likely 2/2 CVA and cerebral edema - Continue Keppra  Acute encephalopathy  - Minimize sedation as safely possible, RASS goal 0 to -1 - RN ICU delirium precaution bundle   DVT: acute DVT R peroneal vein, L popliteal vein, L posterior tibial vein, L peroneal veins - IVC filter placed 8/27 by IR  - Continue to monitor - will need anticoagulation once safe from a surgical perspective.  - Should anticoagulate for 3 months minimum. Likely lifelong if remains immobile with unclear stroke etiology.  -Retrieve filter once anticoagulation established.    Best practice:  Diet:  tube feeds via [facility] Pain/Anxiety/Delirium protocol (if indicated):  propofol VAP protocol (if indicated): ongoing DVT prophylaxis: Per primary. GI prophylaxis: N/A. Glucose control: Monitor. No SSI indicated at this time  Mobility: PT/OT Code Status: Full. Family Communication: Per primary  Disposition:  To PCU  Kipp Brood, MD [facility] ICU Physician [place]  Pager: [phone] Mobile: [phone] After hours: [phone].  07/26/2019, 8:30 AM       07/26/2019 8:19 AM

## 2019-07-26 NOTE — Progress Notes (Signed)
ANTICOAGULATION CONSULT NOTE - Initial Consult  Pharmacy Consult for heparin Indication: DVT  Allergies  Allergen Reactions  . Hydrocodone Nausea Only    Patient Measurements: Height: 5\' 11"  (180.3 cm) Weight: 231 lb 0.7 oz (104.8 kg) IBW/kg (Calculated) : 75.3 Heparin Dosing Weight: 96.6  Vital Signs: Temp: 101.1 F (38.4 C) (09/01 1200) Temp Source: Axillary (09/01 1200) BP: 136/87 (09/01 1400) Pulse Rate: 75 (09/01 1400)  Labs: Recent Labs    07/24/19 0510 07/25/19 0517 07/26/19 0600  HGB 11.2* 11.9* 12.2*  HCT 34.3* 36.0* 38.3*  PLT 150 166 189  CREATININE 0.92 0.80 0.86  CKTOTAL 4,028* 2,311* 1,797*    Estimated Creatinine Clearance: 139.3 mL/min (by C-G formula based on SCr of 0.86 mg/dL).   Medical History: Past Medical History:  Diagnosis Date  . 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 Incidental finding of DVT and IVC filter was placed because patient was not a candidate for anticoagulation due to craniectomy. Neurosurgery cleared patient to initiate anticoagulation. Scr 0.86, CBC WNL and no blood products given. Patient is also on full dose aspirin.   Goal of Therapy:  Heparin level 0.3-0.7 units/ml Monitor platelets by anticoagulation protocol: Yes   Plan:  Hold initial bolus due to bleeding risk. Begin heparin at 1350 units/hr. Obtain level in 6 hours. Daily heparin level, CBC.  [employer] 07/26/2019,2:45 PM

## 2019-07-26 NOTE — Progress Notes (Signed)
Request received to IR for IVC filter removal placed 07/21/19 by Dr. Anselm Pancoast for bilateral DVT, patient was not a candidate for anticoagulation at that time 2/2 CVA with craniotomy.  Per Dr. Moises Blood procedure note, "This IVC filter is potentially retrievable. The patient will be assessed for filter retrieval by Interventional Radiology in approximately 8-12 weeks. Further recommendations regarding filter retrieval, continued surveillance or declaration of device permanence, will be made at that time."  Discussed patient today with Dr. Pascal Lux who agrees with patient being reassessed for possible retrieval in 8-12 weeks as an outpatient in IR clinic.   I have placed an order for patient to be scheduled with Dr. Anselm Pancoast in our clinic to discuss filter retrieval - IR scheduler will call patient with time/date of appointment.   Please call IR with questions or concerns.  Candiss Norse, PA-C

## 2019-07-26 NOTE — Progress Notes (Signed)
Physical Therapy Treatment Patient Details Name: Matthew Black MRN: [id number] DOB: 1976-08-27 Today's Date: 07/26/2019    History of Present Illness Pt is a 43 y.o. M with no known PMH who presents on 8/25 after being found down at work, nonverbal with L hemiparesis. CT head showing large R hemisphere infarct with edema and mild mass effect. 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 Stable trace midline shift. S/p hemicraniectomy with abdominal flap implant. BLE DVT vasc US 8/28: acute DVT bilateral lower extremities, s/p IVC placement.     PT Comments    Patient progressing slowly towards PT goals. Continues to require total A for all aspects of mobility. No head control or balance reactions noted sitting EOB. Pt spontaneously moving his RLE/UE. No movements noted in LUE/LE. Withdraws to painful stimulus LUe/LE. Eyes remained closed for most of session, opening for <50% of the time. Right gaze deviation noted and left neglect. Pt following some simple commands, "thumbs up" "show me 2 fingers." Nods appropriately during session on a few occassions. Pt with impaired attention. Will continue to follow. If pt not able to make progress, will likely need SNF.     Follow Up Recommendations  CIR;Supervision/Assistance - 24 hour     Equipment Recommendations  Other (comment)(TBA)    Recommendations for Other Services       Precautions / Restrictions Precautions Precautions: Fall;Other (comment) Precaution Comments: abd flap, Lft hemiparesis Required Braces or Orthoses: Other Brace Other Brace: helmet ordered but no yet arrived. RN aware Restrictions Weight Bearing Restrictions: No    Mobility  Bed Mobility Overal bed mobility: Needs Assistance Bed Mobility: Supine to Sit;Sit to Supine     Supine to sit: Total assist;+2 for physical assistance Sit to supine: Total assist;+2 for physical assistance   General bed mobility comments: totalA + 2  for all aspects of bed mobility  Transfers                 General transfer comment: unable  Ambulation/Gait                 Stairs             Wheelchair Mobility    Modified Rankin (Stroke Patients Only) Modified Rankin (Stroke Patients Only) Pre-Morbid Rankin Score: No symptoms Modified Rankin: Severe disability     Balance Overall balance assessment: Needs assistance Sitting-balance support: Feet supported;Single extremity supported Sitting balance-Leahy Scale: Zero Sitting balance - Comments: No balance reactions noted. Worked on [employer] through LUE. Pt flopped self towards right and still no balance reactions noted. Poor head control.                                    Cognition Arousal/Alertness: Lethargic Behavior During Therapy: Flat affect Overall Cognitive Status: Impaired/Different from baseline Area of Impairment: Following commands;Attention;Awareness                   Current Attention Level: Focused   Following Commands: Follows one step commands inconsistently   Awareness: Intellectual   General Comments: Keeping eyes closed for ~50% of session, follows commands on right side ~25% of the time. "Thumbs up" "Show me 2 fingers" When asked if he wanted to lay down, nodded yes. When asked if he was tired, nodded yes. Right gaze preference, not able to gaze left or midline. Likely left neglect.  Exercises      General Comments       Pertinent Vitals/Pain Pain Assessment: Faces Faces Pain Scale: Hurts little more Pain Location: grimacing with head movement Pain Descriptors / Indicators: Grimacing Pain Intervention(s): Repositioned;Monitored during session    Home Living     Available Help at Discharge: Family                Prior Function            PT Goals (current goals can now be found in the care plan section) Acute Rehab PT Goals Patient Stated Goal: unable Progress towards PT goals:  Progressing toward goals(slowly)    Frequency    Min 3X/week      PT Plan Current plan remains appropriate    Co-evaluation PT/OT/SLP Co-Evaluation/Treatment: Yes Reason for Co-Treatment: Complexity of the patient's impairments (multi-system involvement);To address functional/ADL transfers;Necessary to address cognition/behavior during functional activity;For patient/therapist safety PT goals addressed during session: Mobility/safety with mobility;Balance OT goals addressed during session: ADL's and self-care      AM-PAC PT "6 Clicks" Mobility   Outcome Measure  Help needed turning from your back to your side while in a flat bed without using bedrails?: Total Help needed moving from lying on your back to sitting on the side of a flat bed without using bedrails?: Total Help needed moving to and from a bed to a chair (including a wheelchair)?: Total Help needed standing up from a chair using your arms (e.g., wheelchair or bedside chair)?: Total Help needed to walk in hospital room?: Total Help needed climbing 3-5 steps with a railing? : Total 6 Click Score: 6    End of Session   Activity Tolerance: Patient limited by lethargy Patient left: in bed;with call bell/phone within reach;with bed alarm set Nurse Communication: Mobility status;Need for lift equipment PT Visit Diagnosis: Hemiplegia and hemiparesis;Other symptoms and signs involving the nervous system (R29.898);Other abnormalities of gait and mobility (R26.89) Hemiplegia - Right/Left: Left Hemiplegia - dominant/non-dominant: Non-dominant Hemiplegia - caused by: Cerebral infarction     Time: [id number] PT Time Calculation (min) (ACUTE ONLY): 21 min  Charges:  $Therapeutic Activity: 8-22 mins                     Wray Kearns, PT, DPT Acute Rehabilitation Services Pager [phone] Office [place] 07/26/2019, 2:07 PM

## 2019-07-26 NOTE — Progress Notes (Signed)
Occupational Therapy Treatment Session  Upon arrival, pt supine in bed with eyes closed. Pt continues to present with decreased balance, arousal, functional use of LUE, right gaze preference, and left inattention/neglect. Pt requiring Total A +2 for bed mobility and sitting at EOB. Pt following simple commands inconsistently and with increased time. Pt able to give thumbs up and hold up two fingers with direct verbal commands. Pending pt progress and increase in arousal, continue to recommend dc CIR for intensive OT. Will continue to follow acutely as admitted.     07/26/19 1348  OT Visit Information  Last OT Received On 07/26/19  Assistance Needed +2  PT/OT/SLP Co-Evaluation/Treatment Yes  Reason for Co-Treatment Complexity of the patient's impairments (multi-system involvement);For patient/therapist safety;To address functional/ADL transfers  OT goals addressed during session ADL's and self-care  History of Present Illness Pt is a 43 y.o. M with no known PMH who presents on 8/25 after being found down at work, nonverbal with L hemiparesis. CT head showing large R hemisphere infarct with edema and mild mass effect. 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 Stable trace midline shift. S/p hemicraniectomy with abdominal flap implant. BLE DVT vasc US 8/28: acute DVT bilateral lower extremities, s/p IVC placement.   Precautions  Precautions Fall;Other (comment)  Precaution Comments Intubated, abd flap, L hemiparesis   Pain Assessment  Pain Assessment Faces  Faces Pain Scale 4  Pain Location grimacing with painful stimuli at hands  Pain Descriptors / Indicators Grimacing  Pain Intervention(s) Monitored during session;Limited activity within patient's tolerance;Repositioned  Cognition  Arousal/Alertness Lethargic  Behavior During Therapy Flat affect  Overall Cognitive Status Impaired/Different from baseline  Area of Impairment Following  commands;Attention;Awareness  Current Attention Level Focused  Following Commands Follows one step commands inconsistently  Awareness Intellectual  General Comments Difficult to fully assess with arousal level. Keeping eyes closed throughout session, following some motor commands on right side. When opening his eyes, decreased visual focus (right gaze preference). Able to follow cue to "give thumbs up" and "put up two fingers" with RUE.   Upper Extremity Assessment  Upper Extremity Assessment RUE deficits/detail;LUE deficits/detail  RUE Deficits / Details Pt reaching out with right hand and grabbing bedrails, therapists hand, and reaching behind his head.  LUE Deficits / Details Flaccid. Slight withdrawl and grimacing to pain  Lower Extremity Assessment  Lower Extremity Assessment Defer to PT evaluation  RLE Deficits / Details Moving spontaneously  LLE Deficits / Details Autonomic movement noted with coughing, otherwise 0/5  ADL  Overall ADL's  Needs assistance/impaired  General ADL Comments Pt continues to require Total A for ADLs.   Bed Mobility  Overal bed mobility Needs Assistance  Bed Mobility Supine to Sit;Sit to Supine  Supine to sit Total assist;+2 for physical assistance  Sit to supine Total assist;+2 for physical assistance  General bed mobility comments totalA + 2 for all aspects of bed mobility  Balance  Overall balance assessment Needs assistance  Sitting-balance support Feet supported;Single extremity supported  Sitting balance-Leahy Scale Zero  Sitting balance - Comments Requiring Total A for sitting at EOB. At one point, pt intentionally leaning laterally to right on his elbow. Total A to return to upright  Restrictions  Weight Bearing Restrictions No  Vision- Assessment  Additional Comments Keeping eye closed for 75% of session. When pt opening his eyes, noting right gaze preference. Difficulty tracking objects. Unabel to corss midline   Transfers  General transfer  comment Defered  for safety  General Comments  General comments (skin integrity, edema, etc.) VSS throughout.  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 1  Help from another person taking care of personal grooming? 1  Help from another person toileting, which includes using toliet, bedpan, or urinal? 1  Help from another person bathing (including washing, rinsing, drying)? 1  Help from another person to put on and taking off regular upper body clothing? 1  Help from another person to put on and taking off regular lower body clothing? 1  6 Click Score 6  OT Goal Progression  Progress towards OT goals Progressing toward goals  Acute Rehab OT Goals  Patient Stated Goal unable  OT Goal Formulation Patient unable to participate in goal setting  Time For Goal Achievement 08/07/19  Potential to Achieve Goals Fair  ADL Goals  Additional ADL Goal #1 pt will demonstrate sustain attention to adl task  Additional ADL Goal #2 pt will sit Eob with max (A) as precursor to adls.  Additional ADL Goal #3 pt will follow 2 step command  OT Time Calculation  OT Start Time (ACUTE ONLY) 1219  OT Stop Time (ACUTE ONLY) 1242   OT Time Calculation (min) 23 min  OT General Charges  $OT Visit 1 Visit  OT Treatments  $Self Care/Home Management  8-22 mins    [place], OTR/L Acute Rehab Pager: [phone] Office: [phone]

## 2019-07-26 NOTE — Progress Notes (Signed)
Inpatient Rehabilitation Admissions Coordinator  Inpatient rehab consult received. I await therapy updates and then will complete consult.  Danne Baxter, RN, MSN Rehab Admissions Coordinator [phone] 07/26/2019 1:13 PM

## 2019-07-26 NOTE — Progress Notes (Signed)
STROKE TEAM PROGRESS NOTE   INTERVAL HISTORY Patient remains neurologically unchaged. Yet sleepy but can be aroused and follows commands on right side.He had cortrack tube placed today for tube feeds.  He is breathing well and handling his airway Vitals:   07/26/19 0800 07/26/19 0900 07/26/19 1000 07/26/19 1100  BP: (!) 132/102 124/72 126/75 136/87  Pulse: 75 74 78 73  Resp: 12 16 15 18   Temp: 99.7 F (37.6 C)     TempSrc: Oral     SpO2: 98% 99% 99% 98%  Weight:      Height:        CBC:  Recent Labs  Lab 07/25/19 0517 07/26/19 0600  WBC 10.3 11.8*  HGB 11.9* 12.2*  HCT 36.0* 38.3*  MCV 88.0 88.2  PLT 166 [id number]    Basic Metabolic Panel:  Recent Labs  Lab 07/21/19 2131  07/23/19 0459  07/25/19 0517 07/26/19 0600  NA 155*   < > 159*   < > 145 146*  K  --    < > 3.4*   < > 3.5 3.5  CL  --    < > 127*   < > 112* 117*  CO2  --    < > 24   < > 24 22  GLUCOSE  --    < > 130*   < > 105* 113*  BUN  --    < > 17   < > 23* 19  CREATININE  --    < > 0.97   < > 0.80 0.86  CALCIUM  --    < > 8.3*   < > 8.3* 8.4*  MG 2.2  --  2.2  --   --   --   PHOS 1.8*  --  3.8  --   --   --    < > = values in this interval not displayed.    IMAGING Ct Head Wo Contrast Ct Cervical Spine Wo Contrast  07/19/2019 1132 1. 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 2. No associated hemorrhage and mild intracranial mass effect at this time, including trace leftward midline shift. 3. Evidence of large vessel occlusion: Hyperdensity of the right ICA terminus, the right A1 and MCA. 4. Unaffected brain parenchyma appears negative. 5.  No acute traumatic injury identified in the cervical spine.   Mr Brain 65 Contrast Mr Angio Head Wo Contrast Mr Angio Neck Wo Contrast 07/19/2019 1330 1. 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 The right ICA terminus, right ACA and MCA also appear occluded. 2. Large right hemisphere infarct mostly sparing the  right PCA territory. Cytotoxic edema but no convincing hemorrhage. Stable mild mass effect with trace leftward midline shift. 3. Intracranial MRA is degraded by motion artifact, but no other large vessel occlusion is suspected. There appears to be lobes reperfusion of the right PCA. 4. The left hemisphere and posterior fossa brain parenchyma appears normal.   Ct Head Wo Contrast 07/19/2019 1532 Similar appearance to earlier. Low-density and swelling of the right hemisphere in the ACA and MCA territories with mass effect and right-to-left shift of 5-6 mm. No hemorrhagic transformation at this time.   Ct Head Wo Contrast 07/20/2019 0444 1. Acute right ACA and MCA territory infarct with progressive swelling that has decompressed through the craniectomy defect. 2. No acute hemorrhage or new infarction.   Ct Angio Head W Or Wo Contrast Ct Angio Neck W Or Wo  Contrast 07/21/2019 1. Stable from prior MRA. 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 No evidence of atherosclerosis or vasculopathy in the other vessels. 2. Cytotoxic edema causes 5 mm of midline shift and brain bulging through the craniectomy defect. Mild petechial hemorrhage is seen at the basal ganglia.   CT Head WO Contrast 07/23/2019 1. Unchanged appearance of massive right MCA and ACA territory infarcts with parenchyma extending through decompressive craniectomy. 2. Small focus of suspected hemorrhage adjacent to the right caudate head.  Chest 1 View Portable  07/23/2019 Worsened atelectasis in the right lower lobe. Right arm PICC tip in the lower right atrium.  2D Echocardiogram  1. The left ventricle has normal systolic function with an ejection fraction of 60-65%. The cavity size was normal. Left ventricular diastolic parameters were normal.  2. The right ventricle has normal systolic function. The cavity was normal. There is no increase in right ventricular wall thickness.  3. The  pericardial effusion is circumferential.  4. Trivial pericardial effusion is present.  5. The mitral valve is grossly normal.  6. The tricuspid valve is grossly normal.  7. The aortic valve is tricuspid. No stenosis of the aortic valve.  8. The aorta is normal unless otherwise noted.  9. The aortic root is normal in size and structure. 10. No cardiac source of embolism identified. 11. When compared to the prior study: No comparison.  LE Dopplers Right: Findings consistent with acute deep vein thrombosis involving the right peroneal veins. Left: Findings consistent with acute deep vein thrombosis involving the left popliteal vein, left posterior tibial veins, and left peroneal veins.  Vas [place] Transcranial Doppler W Bub[place] 07/25/2019 No HITS heard heard at rest. No HITS heard heard during valsalva.   PHYSICAL EXAM   General - Well nourished, well developed, middle-aged African-American male.  He has right hemicraniectomy surgical incision on the scalp. Ophthalmologic - fundi not visualized due to noncooperation. Lungs clear to auscultation. Cardiovascular - Regular rate and rhythm.  Neurological Exam-patient is drowsy, eyes close, +following commands on the right. With forced eye opening, eyes in right gaze position, not blinking to visual threat, doll's eyes sluggish, not tracking, PERRL. Corneal reflex present weakly bilaterally, gag and cough present.  Facial symmetry not able to test due to ET tube.  Tongue protrusion not cooperative. Has spontaneous movement of RUE and RLE. On pain stimulation, mild withdraw of RUE and RLE. But no movement of LUE and LLE. DTR 1+ and no babinski. Sensation, coordination and gait not tested.   ASSESSMENT/PLAN Matthew Black is a 43 y.o. male with no significant past medical history found down x 2 days nonverbal with L hemiparesis.   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 Trace L midline shift. ELVO at R ICA, R A1, R MCA.  MRI  Large R brain infarct sparing R PCA. Cytotoxic edema but no hemorrhage. Stable trace midline shift.  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 R ICA terminus, R ACA, R MCA occluded.   CT head similar w/ low density and sweddling R ACA and MCA territories, now with 5-11mm midline shift. no hemorrhage  CT head acute R ACA and MCA infarct w/ progressive swelling decompressed through craniectomy defect.   CTA head  and neck stable MLS. R ICA occlusion in neck that continues into R ACA and MCA. Mild petechial hemorrhage at basal ganglia  CT Head 8/29 - Unchanged appearance of massive right MCA and ACA territory infarcts with parenchyma extending through decompressive craniectomy. Small focus of suspected hemorrhage adjacent to the right caudate head.  2D Echo EF 60-65%  LE venous doppler DVT in R peroneal, L popliteal, L posterior tibial and L peroneal  TCD bubble no HITS  May consider TEE later once stable  LDL 83  HgbA1c 5.4  UDS positive THC  Hypercoagulable and autoimmune work up negative- lupus anticoagulant, antiphospholipid antibodies and homocystine are all normal Heparin subq for VTE prophylaxis  No antithrombotic prior to admission, now on aspirin 325mg  daily  Therapy recommendations:  CIR. Consult placed  Disposition:  pending   Soft Helmet ordered  Transfer to the floor  Cyctotoxic cerebral edema  S/p R decompressive hemicraniectomy (Nundkumar) w/ flap R abd 07/19/2019  On 3% saline @ 75  PICC placed - keep for now per Leonie Man  Given One dose of 23.4% 8/26  3%  off 8/30 1700  Na 146  Monitor Na daily  ?? Right ICA dissection  MRA and CTA showed right ICA occlusion from origin to terminal  Wife denies any head trauma  Wife stated that pt had recent aggressive exercise with weight lifting  Concerning dissection  as working diagnosis of stroke etiology   On ASA alone.  B LE DVT  LE venous doppler DVT in R peroneal, L popliteal, L posterior tibial and L peroneal  Etiology unclear  Started iv heparin 07/26/2019  IVC filter placed 8/27  Hypercoagulable work up negative   Off SCDs  Plan AC once stable  Acute Respiratory Failure with inadequate airway protection  Intubated for airway protection  Now extubated  Seizure-like activity  Continue Keppra   EEG continuous slowing, excessive beta activity, sleep spindle asymmetry decreased R->related to stroke and sedation. No SZ  Long Term EEG cortical dysfunction in right frontal region  Seizure precautions  Febrile, Leukocytosis  Tmax - 101.8  WBC - 11.8  Vomited PTA, could be due to aspiration  CXR NAD, repeat CXR yest unchanged x temp probe. CXR (07/23/19) -  Worsened atelectasis in the right lower lobe    Hyperlipidemia  Home meds:  no statin  LDL 83, goal < 70  Add lipitor 20 mg daily   Continue statin at discharge  Dysphagia . Secondary to stroke . NPO . Cortrak w/ TF   Other Stroke Risk Factors  Former Cigarette smoker, quit 3 yrs ago  ETOH use  Substance abuse UDS - positive for THC  Obesity, Body mass index is 32.22 kg/m., recommend weight loss, diet and exercise as appropriate   Other Active Problems  At risk for rhabdo CK 7533 -> 5,972->1,797. Hydrate as able. LDH 359. CRP 19.2.    Hypophosphatemia, hypomagnesemia - resolved-> Phos 3.8 (normal) ; Mg 2.2 (normal)  LFTs - AST - 132 (H) ; ALT - 68 (H) - recheck   Hypokalemia - 3.5->3.4 - supplement ->3.5  Hospital day # 7  Plan : agree with iv heparin for acute DVT but stop aspirin. Consider removal of IVC filter. Do not start oral anticoagulation till patient able to swallow or has PEG tube.D/w wife at bedside and Dr Lynetta Mare. This patient is critically ill and at significant risk of neurological worsening, death and care requires constant  monitoring of vital signs, hemodynamics,respiratory and cardiac monitoring, extensive review  of multiple databases, frequent neurological assessment, discussion with family, other specialists and medical decision making of high complexity.I have made any additions or clarifications directly to the above note.This critical care time does not reflect procedure time, or teaching time or supervisory time of PA/NP/Med Resident etc but could involve care discussion time.  I spent 30 minutes of neurocritical care time  in the care of  this patient.      Antony Contras, MD Medical Director [facility] St[facility] Pager: [phone] 07/26/2019 11:21 AM    To contact Stroke Continuity provider, please refer to [email or web address] After hours, contact General Neurology

## 2019-07-26 NOTE — Evaluation (Signed)
Speech Language Pathology Evaluation Patient Details Name: Matthew Black MRN: [id number] DOB: 1976/11/16 Today's Date: 07/26/2019 Time: 1050-1103 SLP Time Calculation (min) (ACUTE ONLY): 13 min  Problem List:  Patient Active Problem List   Diagnosis Date Noted  . Endotracheal tube present   . Acute respiratory failure ([place])   . 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 Pressure injury of skin 07/19/2019  . Acute CVA (cerebrovascular accident) ([place])   . Encephalopathy   . Dysphagia   . Acute encephalopathy   . Essential hypertension   . Annual physical exam 09/11/2016  . Obesity 03/12/2016  . PCP NOTES >>>>>>>>>>>>>>>>>. 03/12/2016  . Scalp abscess 12/20/2015  . Neck abscess 12/20/2015  . Pilonidal cyst 02/08/2013   Past Medical History:  Past Medical History:  Diagnosis Date  . 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 CRANIOTOMY Right 07/19/2019   Procedure: RIGHT HEMI-CRANIECTOMY With implantation of skull flap to abdominal wall;  Surgeon: Consuella Lose, MD;  Location: [facility];  Service: Neurosurgery;  Laterality: Right;  . CYST EXCISION N/A 10/08/2016   Procedure: EXCISION OF POSTERIOR NECK CYST;  Surgeon: Clovis Riley, MD;  Location: [facility];  Service: General;  Laterality: N/A;  . INCISION AND DRAINAGE ABSCESS N/A 09/22/2014   Procedure: INCISION AND DRAINAGE ABSCESS POSTERIOR NECK;  Surgeon: Pedro Earls, MD;  Location: [facility];  Service: General;  Laterality: N/A;  . INCISION AND DRAINAGE ABSCESS N/A 12/20/2015   Procedure: INCISION AND DRAINAGE POSTERIOR NECK MASS;  Surgeon: Armandina Gemma, MD;  Location: [facility];  Service: General;  Laterality: N/A;  . INCISION AND DRAINAGE ABSCESS Left 07/10/2004   middle finger  . IR IVC FILTER PLMT / S&I /IMG GUID/MOD SED  07/21/2019  . IR VENOGRAM RENAL UNI RIGHT  07/21/2019  . MASS EXCISION N/A 07/21/2017   Procedure: EXCISION OF BENIGN NECK LESION WITH LAYERED  CLOSURE;  Surgeon: Irene Limbo, MD;  Location: [place];  Service: Plastics;  Laterality: N/A;  . MASS EXCISION N/A 11/10/2017   Procedure: EXCISION BENIGN LESION OF THE NECK WITH LAYERED CLOSURE;  Surgeon: Irene Limbo, MD;  Location: [facility];  Service: Plastics;  Laterality: N/A;   HPI:  Pt is a 43 y.o. with no known PMH who presents on 8/25 after being found down at work, nonverbal with L hemiparesis. 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 Stable trace midline shift. S/p hemicraniectomy with abdominal flap implant. Intubated 8/26-8/31. CXR worsened atelectasis in the right lower lobe.   Assessment / Plan / Recommendation Clinical Impression  Prior to CVA, pt worked as a Clinical biochemist and was taking online classes, per documentation of wife's report. Pt was lethargic, with intermittent eye contact to tactile and verbal stimulation. He presents with deficits in communication and attention, with further cognitive evaluation recommended when more alert. He made no attempt to spontaneously verbalize, mouth, or gesture in response to yes/no questions. His attention was intermittently focused throughout.  Presently unable to communicate his immediate needs or wants. Auditory comprehension and one-step direction following appears grossly intact as he correctly followed commands for giving a thumbs up with his R hand, raising his middle and pinkie fingers, waving, saluting, and giving a high-five. When asked to show with his fingers how many kids he has he correctly raised 2 fingers. Recommend continued tx centering alertness, attention, orientation, and communication strategies.  SLP Assessment  SLP Recommendation/Assessment: Patient needs continued Speech Lanaguage Pathology Services SLP Visit Diagnosis: Cognitive communication deficit (R41.841)    Follow Up Recommendations  24 hour  supervision/assistance;Inpatient Rehab    Frequency and Duration min 2x/week  2 weeks      SLP Evaluation Cognition  Overall Cognitive Status: Impaired/Different from baseline Arousal/Alertness: Lethargic Orientation Level: Other (comment)(Did not respond to name y/n) Attention: Focused Focused Attention: Impaired Focused Attention Impairment: Functional basic;Verbal basic Memory: (Unable to assess due to lethargy. TBA) Awareness: Impaired Awareness Impairment: Emergent impairment Problem Solving: (to be assessed further ) Safety/Judgment: Impaired       Comprehension  Auditory Comprehension Overall Auditory Comprehension: Impaired Yes/No Questions: Impaired Commands: Impaired One Step Basic Commands: 25-49% accurate Interfering Components: Attention Visual Recognition/Discrimination Discrimination: Not tested Reading Comprehension Reading Status: (TBA)    Expression Expression Primary Mode of Expression: (no efforts to initiate) Verbal Expression Overall Verbal Expression: Impaired Initiation: Impaired Level of Generative/Spontaneous Verbalization: Word Repetition: (TBA) Naming: Not tested Pragmatics: Impairment Impairments: Abnormal affect;Eye contact Interfering Components: Attention Non-Verbal Means of Communication: Gestures(Thumbs up for yes- on command) Written Expression Dominant Hand: (unknown but moving R hand to command per OT) Written Expression: (TBA)   Oral / Motor  Oral Motor/Sensory Function Overall Oral Motor/Sensory Function: Moderate impairment Facial ROM: Reduced left;Suspected CN VII (facial) dysfunction Facial Symmetry: Abnormal symmetry left;Suspected CN VII (facial) dysfunction Facial Strength: Reduced left;Suspected CN VII (facial) dysfunction Motor Speech Overall Motor Speech: Other (comment)(will assess once vocalizing)                       Matthew Black 07/26/2019, 3:15 PM

## 2019-07-26 NOTE — Progress Notes (Signed)
Will keep the PICC line in for now per Dr. Leonie Man

## 2019-07-26 NOTE — Procedures (Signed)
Cortrak  Person Inserting Tube:  Jemiah Cuadra C, RD Tube Type:  Cortrak - 43 inches Tube Location:  Left nare Initial Placement:  Stomach Secured by: Bridle Technique Used to Measure Tube Placement:  Documented cm marking at nare/ corner of mouth Cortrak Secured At:  76 cm    Cortrak Tube Team Note:  Consult received to place a Cortrak feeding tube.   No x-ray is required. RN may begin using tube.   If the tube becomes dislodged please keep the tube and contact the Cortrak team at [email or web address] (password [email or web address]) for replacement.  If after hours and replacement cannot be delayed, place a NG tube and confirm placement with an abdominal x-ray.    Escarlet Saathoff RD, LDN, CNSC [phone] Pager [phone] After Hours Pager   

## 2019-07-26 NOTE — Progress Notes (Signed)
Matthew Black for Heparin Indication: DVT  Allergies  Allergen Reactions  . Hydrocodone Nausea Only    Patient Measurements: Height: 5\' 11"  (180.3 cm) Weight: 220 lb 0.3 oz (99.8 kg) IBW/kg (Calculated) : 75.3 Heparin Dosing Weight: 96.6  Vital Signs: Temp: 100 F (37.8 C) (09/01 2038) Temp Source: Axillary (09/01 2038) BP: 149/85 (09/01 2038) Pulse Rate: 82 (09/01 2038)  Labs: Recent Labs    07/24/19 0510 07/25/19 0517 07/26/19 0600 07/26/19 2251  HGB 11.2* 11.9* 12.2*  --   HCT 34.3* 36.0* 38.3*  --   PLT 150 166 189  --   HEPARINUNFRC  --   --   --  0.25*  CREATININE 0.92 0.80 0.86  --   CKTOTAL 4,028* 2,311* 1,797*  --     Estimated Creatinine Clearance: 136.1 mL/min (by C-G formula based on SCr of 0.86 mg/dL).   Medical History: Past Medical History:  Diagnosis Date  . 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 Incidental finding of DVT and IVC filter was placed because patient was not a candidate for anticoagulation due to craniectomy. Neurosurgery cleared patient to initiate anticoagulation. Scr 0.86, CBC WNL and no blood products given. Patient is also on full dose aspirin.   9/1 PM update: Initial heparin level low No issues per RN  Goal of Therapy:  Heparin level 0.3-0.5 units/mL  Monitor platelets by anticoagulation protocol: Yes   Plan:  No boluses (recent stroke, craniectomy) Inc heparin to 1500 units/hr Re-check heparin level with AM labs  Narda Bonds, PharmD, [place] Pharmacist Phone: [phone]

## 2019-07-26 NOTE — Progress Notes (Signed)
Called to assess right PICC.  Appears WNL.  Flushes easily x 3 with good blood return.  Arm soft with no point tenderness.  Site clean with no signs of infection.  Feel free to consult IV team for any further concerns

## 2019-07-27 DIAGNOSIS — I8290 Acute embolism and thrombosis of unspecified vein: Secondary | ICD-10-CM

## 2019-07-27 DIAGNOSIS — D62 Acute posthemorrhagic anemia: Secondary | ICD-10-CM

## 2019-07-27 DIAGNOSIS — R509 Fever, unspecified: Secondary | ICD-10-CM

## 2019-07-27 DIAGNOSIS — E876 Hypokalemia: Secondary | ICD-10-CM

## 2019-07-27 DIAGNOSIS — R6510 Systemic inflammatory response syndrome (SIRS) of non-infectious origin without acute organ dysfunction: Secondary | ICD-10-CM

## 2019-07-27 DIAGNOSIS — D72829 Elevated white blood cell count, unspecified: Secondary | ICD-10-CM

## 2019-07-27 DIAGNOSIS — I639 Cerebral infarction, unspecified: Secondary | ICD-10-CM

## 2019-07-27 LAB — GLUCOSE, CAPILLARY
Glucose-Capillary: 103 mg/dL — ABNORMAL HIGH (ref 70–99)
Glucose-Capillary: 110 mg/dL — ABNORMAL HIGH (ref 70–99)
Glucose-Capillary: 112 mg/dL — ABNORMAL HIGH (ref 70–99)
Glucose-Capillary: 123 mg/dL — ABNORMAL HIGH (ref 70–99)
Glucose-Capillary: 132 mg/dL — ABNORMAL HIGH (ref 70–99)

## 2019-07-27 LAB — BASIC METABOLIC PANEL
Anion gap: 12 (ref 5–15)
BUN: 26 mg/dL — ABNORMAL HIGH (ref 6–20)
CO2: 23 mmol/L (ref 22–32)
Calcium: 8.6 mg/dL — ABNORMAL LOW (ref 8.9–10.3)
Chloride: 107 mmol/L (ref 98–111)
Creatinine, Ser: 0.87 mg/dL (ref 0.61–1.24)
GFR calc Af Amer: >60 mL/min (ref >60)
GFR calc non Af Amer: >60 mL/min (ref >60)
Glucose, Bld: 120 mg/dL — ABNORMAL HIGH (ref 70–99)
Potassium: 3.2 mmol/L — ABNORMAL LOW (ref 3.5–5.1)
Sodium: 142 mmol/L (ref 135–145)

## 2019-07-27 LAB — HEPARIN LEVEL (UNFRACTIONATED)
Heparin Unfractionated: 0.56 IU/mL (ref 0.30–0.70)
Heparin Unfractionated: 0.44 IU/mL (ref 0.30–0.70)

## 2019-07-27 LAB — CBC
HCT: 37.9 % — ABNORMAL LOW (ref 39.0–52.0)
Hemoglobin: 12.6 g/dL — ABNORMAL LOW (ref 13.0–17.0)
MCH: 28.2 pg (ref 26.0–34.0)
MCHC: 33.2 g/dL (ref 30.0–36.0)
MCV: 84.8 fL (ref 80.0–100.0)
Platelets: 218 10*3/uL (ref 150–400)
RBC: 4.47 MIL/uL (ref 4.22–5.81)
RDW: 13.2 % (ref 11.5–15.5)
WBC: 13.6 10*3/uL — ABNORMAL HIGH (ref 4.0–10.5)
nRBC: 0 % (ref 0.0–0.2)

## 2019-07-27 LAB — CK: Total CK: 1574 U/L — ABNORMAL HIGH (ref 49–397)

## 2019-07-27 MED ORDER — OSMOLITE 1.5 CAL PO LIQD
1000.0000 mL | ORAL | Status: DC
  Administered 2019-07-27 – 2019-08-03 (×8): 1000 mL
  Filled 2019-07-27 (×11): qty 1000

## 2019-07-27 MED ORDER — POTASSIUM CHLORIDE CRYS ER 20 MEQ PO TBCR
20.0000 meq | EXTENDED_RELEASE_TABLET | Freq: Two times a day (BID) | ORAL | Status: DC
  Administered 2019-07-27 – 2019-07-28 (×2): 20 meq via ORAL
  Filled 2019-07-27 (×2): qty 1

## 2019-07-27 MED ORDER — PRO-STAT SUGAR FREE PO LIQD
30.0000 mL | Freq: Two times a day (BID) | ORAL | Status: DC
  Administered 2019-07-27 – 2019-08-03 (×14): 30 mL
  Filled 2019-07-27 (×15): qty 30

## 2019-07-27 NOTE — Progress Notes (Signed)
Complete linen change done.

## 2019-07-27 NOTE — Progress Notes (Signed)
STROKE TEAM PROGRESS NOTE   INTERVAL HISTORY Patient remains neurologically unchaged. Yet sleepy but can be aroused and follows commands on right side.He is getting tube feeds. Serum potassium is low at 3.2 He is breathing well and handling his airway Vitals:   07/27/19 0358 07/27/19 0500 07/27/19 0700 07/27/19 1100  BP: (!) 178/81  134/66 131/76  Pulse: 69  80 89  Resp: 18  19 19   Temp: 98.5 F (36.9 C)  99.4 F (37.4 C) 99.9 F (37.7 C)  TempSrc: Oral  Axillary Axillary  SpO2: 100%  100% 100%  Weight:  99.7 kg    Height:        CBC:  Recent Labs  Lab 07/26/19 0600 07/27/19 0500  WBC 11.8* 13.6*  HGB 12.2* 12.6*  HCT 38.3* 37.9*  MCV 88.2 84.8  PLT 189 [id number]    Basic Metabolic Panel:  Recent Labs  Lab 07/21/19 2131  07/23/19 0459  07/26/19 0600 07/27/19 0500  NA 155*   < > 159*   < > 146* 142  K  --    < > 3.4*   < > 3.5 3.2*  CL  --    < > 127*   < > 117* 107  CO2  --    < > 24   < > 22 23  GLUCOSE  --    < > 130*   < > 113* 120*  BUN  --    < > 17   < > 19 26*  CREATININE  --    < > 0.97   < > 0.86 0.87  CALCIUM  --    < > 8.3*   < > 8.4* 8.6*  MG 2.2  --  2.2  --   --   --   PHOS 1.8*  --  3.8  --   --   --    < > = values in this interval not displayed.    IMAGING Ct Head Wo Contrast Ct Cervical Spine Wo Contrast  07/19/2019 1132 1. 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 2. No associated hemorrhage and mild intracranial mass effect at this time, including trace leftward midline shift. 3. Evidence of large vessel occlusion: Hyperdensity of the right ICA terminus, the right A1 and MCA. 4. Unaffected brain parenchyma appears negative. 5.  No acute traumatic injury identified in the cervical spine.   Mr Brain 52 Contrast Mr Angio Head Wo Contrast Mr Angio Neck Wo Contrast 07/19/2019 1330 1. 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 The right ICA terminus, right ACA and MCA also appear occluded. 2.  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 Cytotoxic edema but no convincing hemorrhage. Stable mild mass effect with trace leftward midline shift. 3. Intracranial MRA is degraded by motion artifact, but no other large vessel occlusion is suspected. There appears to be lobes reperfusion of the right PCA. 4. The left hemisphere and posterior fossa brain parenchyma appears normal.   Ct Head Wo Contrast 07/19/2019 1532 Similar appearance to earlier. Low-density and swelling of the right hemisphere in the ACA and MCA territories with mass effect and right-to-left shift of 5-6 mm. No hemorrhagic transformation at this time.   Ct Head Wo Contrast 07/20/2019 0444 1. Acute right ACA and MCA territory infarct with progressive swelling that has decompressed through the craniectomy defect. 2. No acute hemorrhage or new infarction.   Ct Angio Head W Or  Wo Contrast Ct Angio Neck W Or Wo Contrast 07/21/2019 1. Stable from prior MRA. 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 No evidence of atherosclerosis or vasculopathy in the other vessels. 2. Cytotoxic edema causes 5 mm of midline shift and brain bulging through the craniectomy defect. Mild petechial hemorrhage is seen at the basal ganglia.   CT Head WO Contrast 07/23/2019 1. Unchanged appearance of massive right MCA and ACA territory infarcts with parenchyma extending through decompressive craniectomy. 2. Small focus of suspected hemorrhage adjacent to the right caudate head.  Chest 1 View Portable  07/23/2019 Worsened atelectasis in the right lower lobe. Right arm PICC tip in the lower right atrium.  2D Echocardiogram  1. The left ventricle has normal systolic function with an ejection fraction of 60-65%. The cavity size was normal. Left ventricular diastolic parameters were normal.  2. The right ventricle has normal systolic function. The cavity was normal. There is no  increase in right ventricular wall thickness.  3. The pericardial effusion is circumferential.  4. Trivial pericardial effusion is present.  5. The mitral valve is grossly normal.  6. The tricuspid valve is grossly normal.  7. The aortic valve is tricuspid. No stenosis of the aortic valve.  8. The aorta is normal unless otherwise noted.  9. The aortic root is normal in size and structure. 10. No cardiac source of embolism identified. 11. When compared to the prior study: No comparison.  LE Dopplers Right: Findings consistent with acute deep vein thrombosis involving the right peroneal veins. Left: Findings consistent with acute deep vein thrombosis involving the left popliteal vein, left posterior tibial veins, and left peroneal veins.  Vas [place] Transcranial Doppler W Bubbles 07/25/2019 No HITS heard heard at rest. No HITS heard heard during valsalva.   PHYSICAL EXAM   General - Well nourished, well developed, middle-aged African-American male.  He has right hemicraniectomy surgical incision on the scalp. Ophthalmologic - fundi not visualized due to noncooperation. Lungs clear to auscultation. Cardiovascular - Regular rate and rhythm.  Neurological Exam-patient is drowsy, eyes close, +following commands on the right. With forced eye opening, eyes in right gaze position, not blinking to visual threat, doll's eyes sluggish, not tracking, PERRL. Corneal reflex present weakly bilaterally, gag and cough present.  Facial symmetry not able to test due to ET tube.  Tongue protrusion not cooperative. Has spontaneous movement of RUE and RLE. On pain stimulation, mild withdraw of RUE and RLE. But no movement of LUE and LLE. DTR 1+ and no babinski. Sensation, coordination and gait not tested.   ASSESSMENT/PLAN [name] is a 43 y.o. male with no significant past medical history found down x 2 days nonverbal with L hemiparesis.   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 Trace L midline shift. ELVO at R ICA, R A1, R MCA.  MRI  Large R brain infarct sparing R PCA. Cytotoxic edema but no hemorrhage. Stable trace midline shift.  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 R ICA terminus, R ACA, R MCA occluded.   CT head similar w/ low density and sweddling R ACA and MCA territories, now with 5-55mm midline shift. no hemorrhage  CT head acute R ACA and MCA infarct w/ progressive swelling  decompressed through craniectomy defect.   CTA head and neck stable MLS. R ICA occlusion in neck that continues into R ACA and MCA. Mild petechial hemorrhage at basal ganglia  CT Head 8/29 - Unchanged appearance of massive right MCA and ACA territory infarcts with parenchyma extending through decompressive craniectomy. Small focus of suspected hemorrhage adjacent to the right caudate head.  2D Echo EF 60-65%  LE venous doppler DVT in R peroneal, L popliteal, L posterior tibial and L peroneal  TCD bubble no HITS  May consider TEE later once stable  LDL 83  HgbA1c 5.4  UDS positive THC  Hypercoagulable and autoimmune work up negative- lupus anticoagulant, antiphospholipid antibodies and homocystine are all normal Heparin subq for VTE prophylaxis  No antithrombotic prior to admission, now on aspirin 325mg  daily  Therapy recommendations:  CIR. Consult placed  Disposition:  pending   Soft Helmet ordered  Transfer to the floor  Cyctotoxic cerebral edema  S/p R decompressive hemicraniectomy (Nundkumar) w/ flap R abd 07/19/2019  On 3% saline @ 75  PICC placed - keep for now per Leonie Man  Given One dose of 23.4% 8/26  3%  off 8/30 1700  Na 146  Monitor Na daily  ?? Right ICA dissection  MRA and CTA showed right ICA occlusion from origin to terminal  Wife denies any head trauma  Wife stated that pt had recent  aggressive exercise with weight lifting  Concerning dissection as working diagnosis of stroke etiology   On ASA alone.  B LE DVT  LE venous doppler DVT in R peroneal, L popliteal, L posterior tibial and L peroneal  Etiology unclear  Started iv heparin 07/26/2019  IVC filter placed 8/27  Hypercoagulable work up negative   Off SCDs  Plan AC once stable  Acute Respiratory Failure with inadequate airway protection  Intubated for airway protection  Now extubated  Seizure-like activity  Continue Keppra   EEG continuous slowing, excessive beta activity, sleep spindle asymmetry decreased R->related to stroke and sedation. No SZ  Long Term EEG cortical dysfunction in right frontal region  Seizure precautions  Febrile, Leukocytosis  Tmax - 101.8  WBC - 11.8  Vomited PTA, could be due to aspiration  CXR NAD, repeat CXR yest unchanged x temp probe. CXR (07/23/19) -  Worsened atelectasis in the right lower lobe    Hyperlipidemia  Home meds:  no statin  LDL 83, goal < 70  Add lipitor 20 mg daily   Continue statin at discharge  Dysphagia . Secondary to stroke . NPO . Cortrak w/ TF   Other Stroke Risk Factors  Former Cigarette smoker, quit 3 yrs ago  ETOH use  Substance abuse UDS - positive for THC  Obesity, Body mass index is 30.66 kg/m., recommend weight loss, diet and exercise as appropriate   Other Active Problems  At risk for rhabdo CK 7533 -> 5,972->1,797. Hydrate as able. LDH 359. CRP 19.2.    Hypophosphatemia, hypomagnesemia - resolved-> Phos 3.8 (normal) ; Mg 2.2 (normal)  LFTs - AST - 132 (H) ; ALT - 68 (H) - recheck   Hypokalemia - 3.5->3.4 - supplement ->3.5- 3.2  Hospital day # 8  Plan : Continue iv heparin for acute DVT   Consider removal of IVC filter. Do not start oral anticoagulation till patient able to swallow or has PEG tube Replace potassium. Continue therapies and move to rehab in next few days if accepted     Antony Contras,  MD Medical Director [facility] Pager: [phone] 07/27/2019 4:30 PM    To contact Stroke Continuity provider, please refer to [email or web address] After hours, contact General Neurology

## 2019-07-27 NOTE — Progress Notes (Signed)
Inpatient Rehabilitation Admissions Coordinator  Inpatient rehab consult received. I met with pt's wife at bedside to follow up after Dr. Serita Grit consult to discuss goals and expectations of an inpt rehab admit. Pt currently not at la level to admit to inpt rehab. I will follow his progress as he is able to participate with more therapies. I will contact financial counselor to assist with Disability and Medicaid applications. I will follow.  Danne Baxter, RN, MSN Rehab Admissions Coordinator [phone] 07/27/2019 2:35 PM

## 2019-07-27 NOTE — Progress Notes (Signed)
Physical Therapy Treatment Patient Details Name: Matthew Black MRN: [id number] DOB: [birth year] Today's Date: 07/27/2019    History of Present Illness Pt is a 43 y.o. M with no known PMH who presents on 8/25 after being found down at work, nonverbal with L hemiparesis. CT head showing large R hemisphere infarct with edema and mild mass effect. 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 Stable trace midline shift. S/p hemicraniectomy with abdominal flap implant. BLE DVT vasc US 8/28: acute DVT bilateral lower extremities, s/p IVC placement.     PT Comments    Patient seen for mobility progression. This session focused on bed mobility and sitting balance EOB. Pt is following commands for bed mobility and able to assist with R UE/LE. Pt more alert this session but not safe at this time to leave OOB in chair.  Continue to progress as tolerated.    Follow Up Recommendations  CIR;Supervision/Assistance - 24 hour     Equipment Recommendations  Other (comment)(TBA)    Recommendations for Other Services       Precautions / Restrictions Precautions Precautions: Fall;Other (comment) Precaution Comments: abd flap, Lft hemiparesis Required Braces or Orthoses: Other Brace Other Brace: helmet  Restrictions Weight Bearing Restrictions: No    Mobility  Bed Mobility Overal bed mobility: Needs Assistance Bed Mobility: Sit to Supine;Rolling;Sidelying to Sit Rolling: Total assist;Mod assist;+2 for physical assistance Sidelying to sit: Max assist;+2 for physical assistance;HOB elevated   Sit to supine: Total assist;+2 for physical assistance   General bed mobility comments: pt following commands with R UE/LE and assisted in rolling toward L side and able to hold onto bed rail; max A +2 for elevating trunk into sitting and to scoot hips to EOB; total A +2 for returning to supine   Transfers                 General transfer comment: deferred; pt not  safe to leave up in chair at this time  Ambulation/Gait                 Stairs             Wheelchair Mobility    Modified Rankin (Stroke Patients Only) Modified Rankin (Stroke Patients Only) Pre-Morbid Rankin Score: No symptoms Modified Rankin: Severe disability     Balance Overall balance assessment: Needs assistance Sitting-balance support: Feet supported;Single extremity supported Sitting balance-Leahy Scale: Zero Sitting balance - Comments: poor head control and max A required to maintain sitting balance EOB; approximation through L UE                                     Cognition Arousal/Alertness: Awake/alert Behavior During Therapy: Flat affect Overall Cognitive Status: Impaired/Different from baseline(difficult to assess) Area of Impairment: Following commands                       Following Commands: Follows one step commands inconsistently              Exercises      General Comments        Pertinent Vitals/Pain Pain Assessment: Faces Faces Pain Scale: Hurts little more Pain Location: grimacing with head movement Pain Descriptors / Indicators: Grimacing Pain Intervention(s): Limited activity within patient's tolerance;Monitored during session;Repositioned    Home Living  Prior Function            PT Goals (current goals can now be found in the care plan section) Acute Rehab PT Goals Patient Stated Goal: unable Progress towards PT goals: Progressing toward goals    Frequency    Min 4X/week      PT Plan Current plan remains appropriate    Co-evaluation              AM-PAC PT "6 Clicks" Mobility   Outcome Measure  Help needed turning from your back to your side while in a flat bed without using bedrails?: A Lot Help needed moving from lying on your back to sitting on the side of a flat bed without using bedrails?: Total Help needed moving to and from a bed to  a chair (including a wheelchair)?: Total Help needed standing up from a chair using your arms (e.g., wheelchair or bedside chair)?: Total Help needed to walk in hospital room?: Total Help needed climbing 3-5 steps with a railing? : Total 6 Click Score: 7    End of Session   Activity Tolerance: Patient tolerated treatment well Patient left: in bed;with call bell/phone within reach;with bed alarm set;with restraints reapplied;Other (comment)(bilat mittens) Nurse Communication: Mobility status;Need for lift equipment PT Visit Diagnosis: Hemiplegia and hemiparesis;Other symptoms and signs involving the nervous system (R29.898);Other abnormalities of gait and mobility (R26.89) Hemiplegia - Right/Left: Left Hemiplegia - dominant/non-dominant: Non-dominant Hemiplegia - caused by: Cerebral infarction     Time: [id number] PT Time Calculation (min) (ACUTE ONLY): 36 min  Charges:  $Therapeutic Activity: 23-37 mins                     Earney Navy, PTA Acute Rehabilitation Services Pager: [phone] Office: [phone]     Darliss Cheney 07/27/2019, 5:02 PM

## 2019-07-27 NOTE — Progress Notes (Signed)
[place] for Heparin Indication: DVT  Allergies  Allergen Reactions  . Hydrocodone Nausea Only    Patient Measurements: Height: 5\' 11"  (180.3 cm) Weight: 220 lb 0.3 oz (99.8 kg) IBW/kg (Calculated) : 75.3 Heparin Dosing Weight: 96.6  Vital Signs: Temp: 98.5 F (36.9 C) (09/02 0358) Temp Source: Oral (09/02 0358) BP: 178/81 (09/02 0358) Pulse Rate: 69 (09/02 0358)  Labs: Recent Labs    07/25/19 0517 07/26/19 0600 07/26/19 2251 07/27/19 0500  HGB 11.9* 12.2*  --  12.6*  HCT 36.0* 38.3*  --  37.9*  PLT 166 189  --  218  HEPARINUNFRC  --   --  0.25* 0.56  CREATININE 0.80 0.86  --   --   CKTOTAL 2,311* 1,797*  --   --     Estimated Creatinine Clearance: 136.1 mL/min (by C-G formula based on SCr of 0.86 mg/dL).   Medical History: Past Medical History:  Diagnosis Date  . 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 Incidental finding of DVT and IVC filter was placed because patient was not a candidate for anticoagulation due to craniectomy. Neurosurgery cleared patient to initiate anticoagulation. Scr 0.86, CBC WNL and no blood products given. Patient is also on full dose aspirin.   9/2 AM update: Heparin level elevated No issues per RN  Goal of Therapy:  Heparin level 0.3-0.5 units/mL  Monitor platelets by anticoagulation protocol: Yes   Plan:  Dec heparin to 1400 units/hr Re-check heparin level at [place], PharmD, [place] Pharmacist Phone: [phone]

## 2019-07-27 NOTE — Progress Notes (Signed)
Nutrition Follow-up  DOCUMENTATION CODES:   Obesity unspecified  INTERVENTION:  Discontinue Vital High Protein.  Initiate Osmolite 1.5 formula @ 25 ml/hr via Cortrak NGT and increase by 10 ml every 4 hours to goal rate of 55 ml/hr.   30 ml Prostat BID.    Tube feeding regimen provides 2180 kcal (100% of needs), 113 grams of protein, and 1003 ml of H2O.   NUTRITION DIAGNOSIS:   Inadequate oral intake related to acute illness as evidenced by NPO status; ongoing  GOAL:   Patient will meet greater than or equal to 90% of their needs; met with TF  MONITOR:   TF tolerance, Weight trends, Labs, I & O's, Skin  REASON FOR ASSESSMENT:   Ventilator, Consult Enteral/tube feeding initiation and management  ASSESSMENT:   43 year old male who presented to the ED on 8/25 with AMS. No known PMH. CT head showed large R hemisphere infarct with edema and mild mass effect.   8/25 - s/p emergent decompressive hemicraniectomy 8/31- extubated 9/1- Cortrak NGT place, tube tip in stomach  Pt continues on NPO status. Pt with fatigue and lethargy. RD to modify tube feeding orders as pt no longer on ventilator and now extubated. RD to continue to monitor for tolerance.   Labs and medications reviewed.   Diet Order:   Diet Order            Diet NPO time specified  Diet effective now              EDUCATION NEEDS:   No education needs have been identified at this time  Skin:  Skin Assessment: Skin Integrity Issues: Skin Integrity Issues:: Stage II, Incisions Stage II: L face Incisions: abdomen, head  Last BM:  9/1  Height:   Ht Readings from Last 1 Encounters:  07/20/19 _0  (1.803 m)    Weight:   Wt Readings from Last 1 Encounters:  07/27/19 99.7 kg    Ideal Body Weight:  78.2 kg  BMI:  Body mass index is 30.66 kg/m.  Estimated Nutritional Needs:   Kcal:  2100-2300  Protein:  110-120 grams  Fluid:  >/= 2.1 L/day    Corrin Parker, MS, RD, LDN Pager #  [phone] After hours/ weekend pager # [phone]

## 2019-07-27 NOTE — Progress Notes (Signed)
SLP Cancellation Note  Patient Details Name: Matthew Black MRN: [id number] DOB: [birth year]   Cancelled treatment:       Reason Eval/Treat Not Completed: Fatigue/lethargy limiting ability to participate(Pt was approached for treatment but despite verbal and tactile stimulation was unable to demonstrate an adequate level of alertness to participate. SLP will re-attempt as able.)  Kimaya Whitlatch I. Hardin Negus, [place], [place] Office number [phone] Pager [place] 07/27/2019, 12:25 PM

## 2019-07-27 NOTE — Consult Note (Signed)
Physical Medicine and Rehabilitation Consult Reason for Consult: Left side weakness Referring Physician: Dr. Leonie Man   HPI: Matthew Black is a 43 y.o. right-handed male with unremarkable past medical history on no prescription medications.  Patient quit smoking 3 years ago.  Per chart review and wife, patient lives with his spouse and was independent prior to admission.  He was working as a Clinical biochemist.  Presented 07/19/2019 after being found down.  He had dried vomit around his mouth and was covered in urine.  Patient was nonverbal not moving his left side.  CT of the head showed large right hemisphere infarct with confluence cytotoxic edema in the right ACA and MCA territories.  Evidence of large vessel occlusion.  CT cervical spine negative.  MRI/MRI showed right ICA occlusion.  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  Alcohol negative, urine drug screen positive marijuana, creatinine 1.43, urine culture no growth, COVID negative.  Patient underwent decompressive right hemicraniectomy with abdominal flap implant 07/19/2019 per Dr. Kathyrn Sheriff.  Echocardiogram with ejection fraction of 65%. EEG with severe diffuse encephalopathy no seizure noted.  Hospital course further complicated by lower extremity Doppler showed DVT right peroneal, left popliteal, left posterior tibial and left peroneal.  He underwent IVC filter placement on 07/21/2019.  He was not a candidate for anticoagulation due to craniotomy.  Patient remains n.p.o. with cortrak feeding tube in place.  Keppra for seizure prophylaxis.  Patient remained intubated 07/20/2019 to 07/25/2019.  Therapy evaluation completed with recommendations of physical medicine rehab consult.  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   Past Surgical History:  Procedure Laterality Date   CRANIOTOMY Right 07/19/2019   Procedure: RIGHT HEMI-CRANIECTOMY With implantation of skull flap to abdominal wall;  Surgeon: Consuella Lose, MD;  Location: [facility];  Service: Neurosurgery;  Laterality: Right;   CYST EXCISION N/A 10/08/2016   Procedure: EXCISION OF POSTERIOR NECK CYST;  Surgeon: Clovis Riley, MD;  Location: [facility];  Service: General;  Laterality: N/A;   INCISION AND DRAINAGE ABSCESS N/A 09/22/2014   Procedure: INCISION AND DRAINAGE ABSCESS POSTERIOR NECK;  Surgeon: Pedro Earls, MD;  Location: [facility];  Service: General;  Laterality: N/A;   INCISION AND DRAINAGE ABSCESS N/A 12/20/2015   Procedure: INCISION AND DRAINAGE POSTERIOR NECK MASS;  Surgeon: Armandina Gemma, MD;  Location: [facility];  Service: General;  Laterality: N/A;   INCISION AND DRAINAGE ABSCESS Left 07/10/2004   middle finger   IR IVC FILTER PLMT / S&I /IMG GUID/MOD SED  07/21/2019   IR VENOGRAM RENAL UNI RIGHT  07/21/2019   MASS EXCISION N/A 07/21/2017   Procedure: EXCISION OF BENIGN NECK LESION WITH LAYERED CLOSURE;  Surgeon: Irene Limbo, MD;  Location: [facility];  Service: Plastics;  Laterality: N/A;   MASS EXCISION N/A 11/10/2017   Procedure: EXCISION BENIGN LESION OF THE NECK WITH LAYERED CLOSURE;  Surgeon: Irene Limbo, MD;  Location: [facility];  Service: Plastics;  Laterality: N/A;   Family History  Problem Relation Age of Onset   Diabetes Other        GF   Healthy Mother    Healthy Father    Social History:  reports that he quit smoking about 3 years ago. He smoked 0.00 packs per day. He has never used smokeless tobacco. He reports current alcohol use. He reports that  he does not use drugs. Allergies:  Allergies  Allergen Reactions   Hydrocodone Nausea Only   Medications Prior to Admission  Medication Sig Dispense Refill   ibuprofen (ADVIL,MOTRIN) 800 MG tablet Take 1 tablet (800 mg total) by mouth 3 (three) times daily. (Patient not taking: Reported on 07/19/2019) 21 tablet  0   oseltamivir (TAMIFLU) 75 MG capsule Take 1 capsule (75 mg total) by mouth every 12 (twelve) hours. (Patient not taking: Reported on 07/19/2019) 10 capsule 0    Home: Home Living Family/patient expects to be discharged to:: Unsure Additional Comments: list wife in chart   Lives With: Spouse, Family(2 kids)  Functional History: Prior Function Level of Independence: Independent Comments: working Functional Status:  Mobility: Bed Mobility Overal bed mobility: Needs Assistance Bed Mobility: Supine to Sit, Sit to Supine Supine to sit: Total assist, +2 for physical assistance Sit to supine: Total assist, +2 for physical assistance General bed mobility comments: totalA + 2 for all aspects of bed mobility Transfers General transfer comment: unable      ADL: ADL Overall ADL's : Needs assistance/impaired General ADL Comments: Pt continues to require Total A for ADLs.   Cognition: Cognition Overall Cognitive Status: Impaired/Different from baseline Arousal/Alertness: Lethargic Orientation Level: (unable to assess) Attention: Focused Focused Attention: Impaired Focused Attention Impairment: Functional basic, Verbal basic Memory: (Unable to assess due to lethargy. TBA) Awareness: Impaired Awareness Impairment: Emergent impairment Problem Solving: (to be assessed further ) Safety/Judgment: Impaired Cognition Arousal/Alertness: Lethargic Behavior During Therapy: Flat affect Overall Cognitive Status: Impaired/Different from baseline Area of Impairment: Following commands, Attention, Awareness Current Attention Level: Focused Following Commands: Follows one step commands inconsistently Awareness: Intellectual General Comments: Keeping eyes closed for ~50% of session, follows commands on right side ~25% of the time. "Thumbs up" "Show me 2 fingers" When asked if he wanted to lay down, nodded yes. When asked if he was tired, nodded yes. Right gaze preference, not able to gaze left  or midline. Likely left neglect. Difficult to assess due to: Intubated  Blood pressure (!) 178/81, pulse 69, temperature 98.5 F (36.9 C), temperature source Oral, resp. rate 18, height 5\' 11"  (1.803 m), weight 99.8 kg, SpO2 100 %. Physical Exam  Vitals reviewed. Constitutional: He appears well-developed and well-nourished.  HENT:  Helmet in place + NG  Eyes:  Keeps eyes closed  Neck: No thyromegaly present.  Respiratory: Effort normal. No respiratory distress.  GI: He exhibits no distension.  Musculoskeletal:     Comments: No edema or tenderness in extremities  Neurological:  Patient is lethargic and difficult to arouse He made no attempt to verbalize.   Examine limited due to participation however seen moving his right upper extremity and right lower extremity slightly. DTRs absent bilateral lower extremities  Skin:  Safety helmet in place to craniotomy site.  Psychiatric:  Unable to assess due to mentation    Results for orders placed or performed during the hospital encounter of 07/19/19 (from the past 24 hour(s))  CBC     Status: Abnormal   Collection Time: 07/26/19  6:00 AM  Result Value Ref Range   WBC 11.8 (H) 4.0 - 10.5 K/uL   RBC 4.34 4.22 - 5.81 MIL/uL   Hemoglobin 12.2 (L) 13.0 - 17.0 g/dL   HCT 38.3 (L) 39.0 - 52.0 %   MCV 88.2 80.0 - 100.0 fL   MCH 28.1 26.0 - 34.0 pg   MCHC 31.9 30.0 - 36.0 g/dL   RDW 13.5 11.5 - 15.5 %  Platelets 189 150 - 400 K/uL   nRBC 0.0 0.0 - 0.2 %  Basic metabolic panel     Status: Abnormal   Collection Time: 07/26/19  6:00 AM  Result Value Ref Range   Sodium 146 (H) 135 - 145 mmol/L   Potassium 3.5 3.5 - 5.1 mmol/L   Chloride 117 (H) 98 - 111 mmol/L   CO2 22 22 - 32 mmol/L   Glucose, Bld 113 (H) 70 - 99 mg/dL   BUN 19 6 - 20 mg/dL   Creatinine, Ser 0.86 0.61 - 1.24 mg/dL   Calcium 8.4 (L) 8.9 - 10.3 mg/dL   GFR calc non Af Amer >60 >60 mL/min   GFR calc Af Amer >60 >60 mL/min   Anion gap 7 5 - 15  CK     Status:  Abnormal   Collection Time: 07/26/19  6:00 AM  Result Value Ref Range   Total CK 1,797 (H) 49 - 397 U/L  Glucose, capillary     Status: Abnormal   Collection Time: 07/26/19  8:04 AM  Result Value Ref Range   Glucose-Capillary 112 (H) 70 - 99 mg/dL   Comment 1 Notify RN    Comment 2 Document in Chart   Glucose, capillary     Status: Abnormal   Collection Time: 07/26/19 11:42 AM  Result Value Ref Range   Glucose-Capillary 111 (H) 70 - 99 mg/dL   Comment 1 Notify RN    Comment 2 Document in Chart   Glucose, capillary     Status: None   Collection Time: 07/26/19  3:23 PM  Result Value Ref Range   Glucose-Capillary 87 70 - 99 mg/dL   Comment 1 Notify RN    Comment 2 Document in Chart   Hepatic function panel     Status: Abnormal   Collection Time: 07/26/19  4:00 PM  Result Value Ref Range   Total Protein 7.3 6.5 - 8.1 g/dL   Albumin 3.0 (L) 3.5 - 5.0 g/dL   AST 219 (H) 15 - 41 U/L   ALT 244 (H) 0 - 44 U/L   Alkaline Phosphatase 90 38 - 126 U/L   Total Bilirubin 1.1 0.3 - 1.2 mg/dL   Bilirubin, Direct 0.3 (H) 0.0 - 0.2 mg/dL   Indirect Bilirubin 0.8 0.3 - 0.9 mg/dL  Glucose, capillary     Status: Abnormal   Collection Time: 07/26/19  7:35 PM  Result Value Ref Range   Glucose-Capillary 112 (H) 70 - 99 mg/dL  Heparin level (unfractionated)     Status: Abnormal   Collection Time: 07/26/19 10:51 PM  Result Value Ref Range   Heparin Unfractionated 0.25 (L) 0.30 - 0.70 IU/mL  Glucose, capillary     Status: Abnormal   Collection Time: 07/27/19 12:02 AM  Result Value Ref Range   Glucose-Capillary 112 (H) 70 - 99 mg/dL  Glucose, capillary     Status: Abnormal   Collection Time: 07/27/19  4:07 AM  Result Value Ref Range   Glucose-Capillary 110 (H) 70 - 99 mg/dL   Vas [place] Transcranial Doppler W Bubbles  Result Date: 07/26/2019  Transcranial Doppler with Bubble Indications: Stroke. Performing Technologist: [name] RVS  Examination Guidelines: A complete evaluation includes B-mode  imaging, spectral Doppler, color Doppler, and power Doppler as needed of all accessible portions of each vessel. Bilateral testing is considered an integral part of a complete examination. Limited examinations for reoccurring indications may be performed as noted.  Summary:  A vascular evaluation was  performed. The left Opthalmic Artery was studied. An IV was inserted into the patient's right PICC line. Verbal informed consent was obtained.  No HITS heard heard at rest. No HITS heard heard during valsalva. Negative TCD Bubble study *See table(s) above for measurements and observations.  Diagnosing physician: Antony Contras MD Electronically signed by Antony Contras MD on 07/26/2019 at 1:15:00 PM.    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  Records reviewed and summated above.  1. Does the need for close, 24 hr/day medical supervision in concert with the patient's rehab needs make it unreasonable for this patient to be served in a less intensive setting? Yes 2. Co-Morbidities requiring supervision/potential complications: fevers (repeat labs, cont to monitor for signs and symptoms of infection, further workup if indicated), hypokalemia (continue to monitor and replete as necessary), leukocytosis (repeat labs, cont to monitor for signs and symptoms of infection, further workup if indicated), SIRS, ABLA (repeat labs, consider transfusion if necessary to ensure appropriate perfusion for increased activity tolerance), DVT (transition from heparin ggt when appropriate). 3. Due to bladder management, bowel management, safety, skin/wound care, disease management, medication administration, pain management and patient education, does the patient require 24 hr/day rehab nursing? Yes 4. Does the patient require coordinated care of a physician, rehab nurse, PT (1-2 hrs/day, 5 days/week), OT (1-2 hrs/day, 5  days/week) and SLP (1-2 hrs/day, 5 days/week) to address physical and functional deficits in the context of the above medical diagnosis(es)? Yes Addressing deficits in the following areas: balance, endurance, locomotion, strength, transferring, bowel/bladder control, bathing, dressing, feeding, grooming, toileting, cognition, speech, language, swallowing and psychosocial support 5. Can the patient actively participate in an intensive therapy program of at least 3 hrs of therapy per day at least 5 days per week? Not at present 6. The potential for patient to make measurable gains while on inpatient rehab is excellent 7. Anticipated functional outcomes upon discharge from inpatient rehab are mod assist  with PT, mod assist with OT, supervision and min assist with SLP. 8. Estimated rehab length of stay to reach the above functional goals is: 22-27 days. 9. Anticipated D/C setting: Home 10. Anticipated post D/C treatments: HH therapy and Home excercise program 11. Overall Rehab/Functional Prognosis: good  RECOMMENDATIONS: This patient's condition is appropriate for continued rehabilitative care in the following setting: CIR to decrease burden of care when medically stable and able to tolerate 3 hours of therapy/day. Patient has agreed to participate in recommended program. Potentially Note that insurance prior authorization may be required for reimbursement for recommended care.  Comment: Rehab Admissions Coordinator to follow up.   I have personally performed a face to face diagnostic evaluation, including, but not limited to relevant history and physical exam findings, of this patient and developed relevant assessment and plan.  Additionally, I have reviewed and concur with the physician assistant's documentation above.   Delice Lesch, MD, ABPMR Lavon Paganini Angiulli, PA-C 07/27/2019

## 2019-07-27 NOTE — Progress Notes (Signed)
Matthew Black for Heparin Indication: DVT  Allergies  Allergen Reactions  . Hydrocodone Nausea Only    Patient Measurements: Height: 5\' 11"  (180.3 cm) Weight: 219 lb 12.8 oz (99.7 kg) IBW/kg (Calculated) : 75.3 Heparin Dosing Weight: 96.6  Vital Signs: Temp: 99.9 F (37.7 C) (09/02 1100) Temp Source: Axillary (09/02 1100) BP: 131/76 (09/02 1100) Pulse Rate: 89 (09/02 1100)  Labs: Recent Labs    07/25/19 0517 07/26/19 0600 07/26/19 2251 07/27/19 0500 07/27/19 1515  HGB 11.9* 12.2*  --  12.6*  --   HCT 36.0* 38.3*  --  37.9*  --   PLT 166 189  --  218  --   HEPARINUNFRC  --   --  0.25* 0.56 0.44  CREATININE 0.80 0.86  --  0.87  --   CKTOTAL 2,311* 1,797*  --  1,574*  --     Estimated Creatinine Clearance: 134.5 mL/min (by C-G formula based on SCr of 0.87 mg/dL).   Medical History: Past Medical History:  Diagnosis Date  . 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 Incidental finding of DVT and IVC filter was placed because patient was not a candidate for anticoagulation due to craniectomy. Neurosurgery cleared patient to initiate anticoagulation. Scr 0.86, CBC WNL and no blood products given. Patient is also on full dose aspirin.   Heparin level this evening within lower goal range.  No voert bleeding or complications noted.  Goal of Therapy:  Heparin level 0.3-0.5 units/mL  Monitor platelets by anticoagulation protocol: Yes   Plan:  Continue IV heparin at current rate. Daily heparin level and CBC. F/u plans for oral anticoagulation as able.  Marguerite Olea, [facility] Clinical Pharmacist Phone [phone]  07/27/2019 4:10 PM

## 2019-07-28 ENCOUNTER — Inpatient Hospital Stay (HOSPITAL_COMMUNITY): Payer: Medicaid Other

## 2019-07-28 LAB — GLUCOSE, CAPILLARY
Glucose-Capillary: 151 mg/dL — ABNORMAL HIGH (ref 70–99)
Glucose-Capillary: 116 mg/dL — ABNORMAL HIGH (ref 70–99)
Glucose-Capillary: 115 mg/dL — ABNORMAL HIGH (ref 70–99)
Glucose-Capillary: 142 mg/dL — ABNORMAL HIGH (ref 70–99)
Glucose-Capillary: 126 mg/dL — ABNORMAL HIGH (ref 70–99)
Glucose-Capillary: 126 mg/dL — ABNORMAL HIGH (ref 70–99)

## 2019-07-28 LAB — CBC
HCT: 37.4 % — ABNORMAL LOW (ref 39.0–52.0)
Hemoglobin: 12.5 g/dL — ABNORMAL LOW (ref 13.0–17.0)
MCH: 28.4 pg (ref 26.0–34.0)
MCHC: 33.4 g/dL (ref 30.0–36.0)
MCV: 85 fL (ref 80.0–100.0)
Platelets: 219 10*3/uL (ref 150–400)
RBC: 4.4 MIL/uL (ref 4.22–5.81)
RDW: 13.3 % (ref 11.5–15.5)
WBC: 13.5 10*3/uL — ABNORMAL HIGH (ref 4.0–10.5)
nRBC: 0 % (ref 0.0–0.2)

## 2019-07-28 LAB — BASIC METABOLIC PANEL
Anion gap: 13 (ref 5–15)
BUN: 30 mg/dL — ABNORMAL HIGH (ref 6–20)
CO2: 24 mmol/L (ref 22–32)
Calcium: 8.8 mg/dL — ABNORMAL LOW (ref 8.9–10.3)
Chloride: 104 mmol/L (ref 98–111)
Creatinine, Ser: 0.99 mg/dL (ref 0.61–1.24)
Glucose, Bld: 125 mg/dL — ABNORMAL HIGH (ref 70–99)
Potassium: 3.4 mmol/L — ABNORMAL LOW (ref 3.5–5.1)
Sodium: 141 mmol/L (ref 135–145)

## 2019-07-28 LAB — HEPARIN LEVEL (UNFRACTIONATED): Heparin Unfractionated: 0.46 IU/mL (ref 0.30–0.70)

## 2019-07-28 MED ORDER — FREE WATER
100.0000 mL | Freq: Once | Status: AC
  Administered 2019-07-28: 05:00:00 100 mL

## 2019-07-28 MED ORDER — POTASSIUM CHLORIDE 20 MEQ PO PACK
20.0000 meq | PACK | Freq: Two times a day (BID) | ORAL | Status: AC
  Administered 2019-07-28 – 2019-07-30 (×4): 20 meq via ORAL
  Filled 2019-07-28 (×4): qty 1

## 2019-07-28 NOTE — Progress Notes (Signed)
STROKE TEAM PROGRESS NOTE   INTERVAL HISTORY Patient remains   sleepy but can be aroused and follows occasionalcommands on right side.He is getting tube feeds. Serum potassium is low at 3.2 He is breathing well and handling his airway.Tmax 100.3 WBC 13.5 Vitals:   07/28/19 0020 07/28/19 0305 07/28/19 0753 07/28/19 1209  BP: (!) 149/73 (!) 176/71 (!) 145/82 (!) 143/93  Pulse: (!) 107 83 82 99  Resp: 17 18 18 18   Temp: 100.3 F (37.9 C) 98.6 F (37 C) 98.5 F (36.9 C) 98.5 F (36.9 C)  TempSrc: Oral Oral Axillary Oral  SpO2: 100% 99% 100% 97%  Weight:      Height:        CBC:  Recent Labs  Lab 07/27/19 0500 07/28/19 0516  WBC 13.6* 13.5*  HGB 12.6* 12.5*  HCT 37.9* 37.4*  MCV 84.8 85.0  PLT 218 [id number]    Basic Metabolic Panel:  Recent Labs  Lab 07/21/19 2131  07/23/19 0459  07/27/19 0500 07/28/19 0516  NA 155*   < > 159*   < > 142 141  K  --    < > 3.4*   < > 3.2* 3.4*  CL  --    < > 127*   < > 107 104  CO2  --    < > 24   < > 23 24  GLUCOSE  --    < > 130*   < > 120* 125*  BUN  --    < > 17   < > 26* 30*  CREATININE  --    < > 0.97   < > 0.87 0.99  CALCIUM  --    < > 8.3*   < > 8.6* 8.8*  MG 2.2  --  2.2  --   --   --   PHOS 1.8*  --  3.8  --   --   --    < > = values in this interval not displayed.    IMAGING Ct Head Wo Contrast Ct Cervical Spine Wo Contrast  07/19/2019 1132 1. 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 2. No associated hemorrhage and mild intracranial mass effect at this time, including trace leftward midline shift. 3. Evidence of large vessel occlusion: Hyperdensity of the right ICA terminus, the right A1 and MCA. 4. Unaffected brain parenchyma appears negative. 5.  No acute traumatic injury identified in the cervical spine.   Mr Brain 77 Contrast Mr Angio Head Wo Contrast Mr Angio Neck Wo Contrast 07/19/2019 1330 1. 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 The right ICA terminus,  right ACA and MCA also appear occluded. 2. 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 Cytotoxic edema but no convincing hemorrhage. Stable mild mass effect with trace leftward midline shift. 3. Intracranial MRA is degraded by motion artifact, but no other large vessel occlusion is suspected. There appears to be lobes reperfusion of the right PCA. 4. The left hemisphere and posterior fossa brain parenchyma appears normal.   Ct Head Wo Contrast 07/19/2019 1532 Similar appearance to earlier. Low-density and swelling of the right hemisphere in the ACA and MCA territories with mass effect and right-to-left shift of 5-6 mm. No hemorrhagic transformation at this time.   Ct Head Wo Contrast 07/20/2019 0444 1. Acute right ACA and MCA territory infarct with progressive swelling that has decompressed through the craniectomy defect. 2. No acute hemorrhage or new  infarction.   Ct Angio Head W Or Wo Contrast Ct Angio Neck W Or Wo Contrast 07/21/2019 1. Stable from prior MRA. 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 No evidence of atherosclerosis or vasculopathy in the other vessels. 2. Cytotoxic edema causes 5 mm of midline shift and brain bulging through the craniectomy defect. Mild petechial hemorrhage is seen at the basal ganglia.   CT Head WO Contrast 07/23/2019 1. Unchanged appearance of massive right MCA and ACA territory infarcts with parenchyma extending through decompressive craniectomy. 2. Small focus of suspected hemorrhage adjacent to the right caudate head.  Chest 1 View Portable  07/23/2019 Worsened atelectasis in the right lower lobe. Right arm PICC tip in the lower right atrium.  2D Echocardiogram  1. The left ventricle has normal systolic function with an ejection fraction of 60-65%. The cavity size was normal. Left ventricular diastolic parameters were normal.  2. The right ventricle has normal systolic function.  The cavity was normal. There is no increase in right ventricular wall thickness.  3. The pericardial effusion is circumferential.  4. Trivial pericardial effusion is present.  5. The mitral valve is grossly normal.  6. The tricuspid valve is grossly normal.  7. The aortic valve is tricuspid. No stenosis of the aortic valve.  8. The aorta is normal unless otherwise noted.  9. The aortic root is normal in size and structure. 10. No cardiac source of embolism identified. 11. When compared to the prior study: No comparison.  LE Dopplers Right: Findings consistent with acute deep vein thrombosis involving the right peroneal veins. Left: Findings consistent with acute deep vein thrombosis involving the left popliteal vein, left posterior tibial veins, and left peroneal veins.  Vas [place] Transcranial Doppler W Bubbles 07/25/2019 No HITS heard heard at rest. No HITS heard heard during valsalva.   PHYSICAL EXAM   General - Well nourished, well developed, middle-aged African-American male.  He has right hemicraniectomy surgical incision on the scalp. Ophthalmologic - fundi not visualized due to noncooperation. Lungs clear to auscultation. Cardiovascular - Regular rate and rhythm.  Neurological Exam-patient is drowsy, eyes close, +following commands on the right. With forced eye opening, eyes in right gaze position, not blinking to visual threat, doll's eyes sluggish, not tracking, PERRL. Corneal reflex present weakly bilaterally, gag and cough present.  Facial symmetry not able to test due to ET tube.  Tongue protrusion not cooperative. Has spontaneous movement of RUE and RLE. On pain stimulation, mild withdraw of RUE and RLE. But no movement of LUE and LLE. DTR 1+ and no babinski. Sensation, coordination and gait not tested.   ASSESSMENT/PLAN Matthew Black is a 43 y.o. male with no significant past medical history found down x 2 days nonverbal with L hemiparesis.   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 Trace L midline shift. ELVO at R ICA, R A1, R MCA.  MRI  Large R brain infarct sparing R PCA. Cytotoxic edema but no hemorrhage. Stable trace midline shift.  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 R ICA terminus, R ACA, R MCA occluded.   CT head similar w/ low density and sweddling R ACA and MCA territories, now with 5-64mm midline shift. no hemorrhage  CT head acute  R ACA and MCA infarct w/ progressive swelling decompressed through craniectomy defect.   CTA head and neck stable MLS. R ICA occlusion in neck that continues into R ACA and MCA. Mild petechial hemorrhage at basal ganglia  CT Head 8/29 - Unchanged appearance of massive right MCA and ACA territory infarcts with parenchyma extending through decompressive craniectomy. Small focus of suspected hemorrhage adjacent to the right caudate head.  2D Echo EF 60-65%  LE venous doppler DVT in R peroneal, L popliteal, L posterior tibial and L peroneal  TCD bubble no HITS  May consider TEE later once stable  LDL 83  HgbA1c 5.4  UDS positive THC  Hypercoagulable and autoimmune work up negative- lupus anticoagulant, antiphospholipid antibodies and homocystine are all normal Heparin subq for VTE prophylaxis  No antithrombotic prior to admission, now on aspirin 325mg  daily  Therapy recommendations:  CIR. Consult placed  Disposition:  pending   Soft Helmet ordered  Transfer to the floor  Cyctotoxic cerebral edema  S/p R decompressive hemicraniectomy (Nundkumar) w/ flap R abd 07/19/2019  On 3% saline @ 75  PICC placed - keep for now per Leonie Man  Given One dose of 23.4% 8/26  3%  off 8/30 1700  Na 146  Monitor Na daily  ?? Right ICA dissection  MRA and CTA showed right ICA occlusion from origin to terminal  Wife denies any head  trauma  Wife stated that pt had recent aggressive exercise with weight lifting  Concerning dissection as working diagnosis of stroke etiology   On ASA alone.  B LE DVT  LE venous doppler DVT in R peroneal, L popliteal, L posterior tibial and L peroneal  Etiology unclear  Started iv heparin 07/26/2019  IVC filter placed 8/27  Hypercoagulable work up negative   Off SCDs  Plan AC once stable  Acute Respiratory Failure with inadequate airway protection  Intubated for airway protection  Now extubated  Seizure-like activity  Continue Keppra   EEG continuous slowing, excessive beta activity, sleep spindle asymmetry decreased R->related to stroke and sedation. No SZ  Long Term EEG cortical dysfunction in right frontal region  Seizure precautions  Febrile, Leukocytosis  Tmax - 101.8  WBC - 11.8  Vomited PTA, could be due to aspiration  CXR NAD, repeat CXR yest unchanged x temp probe. CXR (07/23/19) -  Worsened atelectasis in the right lower lobe    Hyperlipidemia  Home meds:  no statin  LDL 83, goal < 70  Add lipitor 20 mg daily   Continue statin at discharge  Dysphagia . Secondary to stroke . NPO . Cortrak w/ TF   Other Stroke Risk Factors  Former Cigarette smoker, quit 3 yrs ago  ETOH use  Substance abuse UDS - positive for THC  Obesity, Body mass index is 30.66 kg/m., recommend weight loss, diet and exercise as appropriate   Other Active Problems  At risk for rhabdo CK 7533 -> 5,972->1,797. Hydrate as able. LDH 359. CRP 19.2.    Hypophosphatemia, hypomagnesemia - resolved-> Phos 3.8 (normal) ; Mg 2.2 (normal)  LFTs - AST - 132 (H) ; ALT - 68 (H) - recheck   Hypokalemia - 3.5->3.4 - supplement ->3.5- 3.2  Hospital day # 9  Plan : Continue iv heparin for acute DVT Check chest xray.Replace potassium. Continue therapies and move to rehab in next few days if accepted     Antony Contras, MD Medical Director [place] Pager:  [phone] 07/28/2019 2:57 PM    To contact  Stroke Continuity provider, please refer to [email or web address] After hours, contact General Neurology

## 2019-07-28 NOTE — Progress Notes (Signed)
Inpatient Rehabilitation Admissions Coordinator  Patient not yet at a level to consider for inpt rehab admission. I will continue to follow over the next several days.  Danne Baxter, RN, MSN Rehab Admissions Coordinator [phone] 07/28/2019 5:44 PM

## 2019-07-28 NOTE — Progress Notes (Signed)
Occupational Therapy Treatment Patient Details Name: Matthew Black MRN: [id number] DOB: 1976-06-14 Today's Date: 07/28/2019    History of present illness Pt is a 43 y.o. M with no known PMH who presents on 8/25 after being found down at work, nonverbal with L hemiparesis. CT head showing large R hemisphere infarct with edema and mild mass effect. 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 Stable trace midline shift. S/p hemicraniectomy with abdominal flap implant. BLE DVT vasc US 8/28: acute DVT bilateral lower extremities, s/p IVC placement.    OT comments  Pt progressing to mod to maxA+2 for bed mobility for supine/sidelying to sitting EOB. Once EOB maxA to minA for stability in trunk. [place] for head control and helmet. After a few mins of sitting EOB, pt requiring to return to bed as pt was totalA and not opening eyes- very lethargic,and unable to arouse. Pt making spontaneous movements with RUE. No seizure activity noted- assume fatigue. Pt sitting <10 mins at EOB. Pt continues to be totalA for ADL. Occasionally vision to R is able to track OT or PTA in standing with movements nor placing finger in front of eyes for scanning. Pt would greatly benefit from continued OT skilled services for ADL, mobility and safety in CIR setting. OT following acutely.  L wrist elevated and continue to assess for splint need.    Follow Up Recommendations  CIR    Equipment Recommendations  Other (comment)(to be determined)    Recommendations for Other Services      Precautions / Restrictions Precautions Precautions: Fall;Other (comment) Precaution Comments: abd flap, Lft hemiparesis Required Braces or Orthoses: Other Brace Other Brace: helmet  Restrictions Weight Bearing Restrictions: No       Mobility Bed Mobility Overal bed mobility: Needs Assistance Bed Mobility: Sit to Supine;Rolling;Sidelying to Sit Rolling: Max assist;+2 for physical assistance;+2  for safety/equipment Sidelying to sit: Max assist;+2 for physical assistance;HOB elevated   Sit to supine: Total assist;+2 for physical assistance   General bed mobility comments: Pt rolling towards L side and RUE guided to bedrail to assist with supine to sitting EOB. Pt very fatigued after a 5 mins of sitting EOB with intermittent assist for dynamic sitting balance.  Transfers                 General transfer comment: deferred; pt not safe to leave up in chair at this time    Balance Overall balance assessment: Needs assistance Sitting-balance support: Feet supported;Single extremity supported Sitting balance-Leahy Scale: Zero Sitting balance - Comments: poor head control and max A required to maintain sitting balance EOB; approximation through L UE                                    ADL either performed or assessed with clinical judgement   ADL Overall ADL's : Needs assistance/impaired                                     Functional mobility during ADLs: Total assistance;+2 for physical assistance;+2 for safety/equipment General ADL Comments: Pt continues to require Total A for ADLs.      Vision       Perception     Praxis      Cognition Arousal/Alertness: Lethargic Behavior During Therapy: Flat affect Overall Cognitive Status:  Impaired/Different from baseline Area of Impairment: Following commands;Awareness;Attention                   Current Attention Level: Focused   Following Commands: Follows one step commands inconsistently   Awareness: Intellectual            Exercises     Shoulder Instructions       General Comments pt showing a thumbs up, but no thumbs down. Pt snapping RUE fingers together. Soft jazzy music playing for pt.    Pertinent Vitals/ Pain       Pain Assessment: Faces Faces Pain Scale: No hurt  Home Living                                          Prior  Functioning/Environment              Frequency  Min 2X/week        Progress Toward Goals  OT Goals(current goals can now be found in the care plan section)  Progress towards OT goals: Progressing toward goals  Acute Rehab OT Goals Patient Stated Goal: unable OT Goal Formulation: Patient unable to participate in goal setting Time For Goal Achievement: 08/07/19 Potential to Achieve Goals: Fair ADL Goals Additional ADL Goal #1: pt will demonstrate sustain attention to adl task Additional ADL Goal #2: pt will sit Eob with max (A) as precursor to adls. Additional ADL Goal #3: pt will follow 2 step command  Plan Discharge plan remains appropriate    Co-evaluation    PT/OT/SLP Co-Evaluation/Treatment: Yes Reason for Co-Treatment: Complexity of the patient's impairments (multi-system involvement);For patient/therapist safety   OT goals addressed during session: ADL's and self-care      AM-PAC OT "6 Clicks" Daily Activity     Outcome Measure   Help from another person eating meals?: Total Help from another person taking care of personal grooming?: Total Help from another person toileting, which includes using toliet, bedpan, or urinal?: Total Help from another person bathing (including washing, rinsing, drying)?: Total Help from another person to put on and taking off regular upper body clothing?: Total Help from another person to put on and taking off regular lower body clothing?: Total 6 Click Score: 6    End of Session    OT Visit Diagnosis: Unsteadiness on feet (R26.81);Muscle weakness (generalized) (M62.81);Hemiplegia and hemiparesis Hemiplegia - Right/Left: Left Hemiplegia - dominant/non-dominant: Non-Dominant Hemiplegia - caused by: Nontraumatic intracerebral hemorrhage   Activity Tolerance Patient tolerated treatment well   Patient Left in bed;with call bell/phone within reach;with bed alarm set   Nurse Communication Mobility status;Precautions         Time: [id number] OT Time Calculation (min): 28 min  Charges: OT General Charges $OT Visit: 1 Visit OT Treatments $Neuromuscular Re-education: 8-22 mins  Darryl Nestle) Marsa Aris OTR/L Acute Rehabilitation Services Pager: [phone] Office: [place] 07/28/2019, 1:36 PM

## 2019-07-28 NOTE — Progress Notes (Signed)
  Speech Language Pathology Treatment: Dysphagia  Patient Details Name: Matthew Black MRN: [id number] DOB: [birth year] Today's Date: 07/28/2019 Time: [id number] SLP Time Calculation (min) (ACUTE ONLY): 15 min  Assessment / Plan / Recommendation Clinical Impression  SLP followed up for PO readiness. RN reports pt with improving mentation, opening eyes, able to follow some simple commands this morning. During interaction with SLP, pt unable to follow commands though alert. Oral care provided. Trialed single ice chip. Pt with oral holding, no bolus manipulation/nor active mastication followed by delayed cough and expectoration of ice chip from oral cavity. 1/4 teaspoon of puree administered in anterior portion of oral cavity, again no active swallow sequencing exhibited. SLP removed puree via oral swab followed by oral care. SLP to continue to follow for PO readiness. Pt not yet exhibiting readiness for objective swallow study or diet initiation, though per prior notes, appears to be exhibiting slow improvements in mentation.     HPI HPI: Pt is a 43 y.o. with no known PMH who presents on 8/25 after being found down at work, nonverbal with L hemiparesis. 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 Stable trace midline shift. S/p hemicraniectomy with abdominal flap implant. Intubated 8/26-8/31. CXR worsened atelectasis in the right lower lobe.      SLP Plan  Continue with current plan of care       Recommendations  Diet recommendations: NPO Medication Administration: Via alternative means                General recommendations: Rehab consult Oral Care Recommendations: Oral care QID Follow up Recommendations: 24 hour supervision/assistance;Inpatient Rehab SLP Visit Diagnosis: Dysphagia, unspecified (R13.10) Plan: Continue with current plan of care       [place], [place]   07/28/2019, 9:36 AM

## 2019-07-28 NOTE — Progress Notes (Signed)
Physical Therapy Treatment Patient Details Name: Matthew Black MRN: [id number] DOB: [birth year] Today's Date: 07/28/2019    History of Present Illness Pt is a 43 y.o. M with no known PMH who presents on 8/25 after being found down at work, nonverbal with L hemiparesis. CT head showing large R hemisphere infarct with edema and mild mass effect. 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 Stable trace midline shift. S/p hemicraniectomy with abdominal flap implant. BLE DVT vasc US 8/28: acute DVT bilateral lower extremities, s/p IVC placement.     PT Comments    Patient seen for mobility progression. Pt is making gradual progress toward PT goals and tolerated session well. Pt requires +2 assist for bed mobility and +1-2 for sitting balance EOB. Continue to progress as tolerated.   Follow Up Recommendations  CIR;Supervision/Assistance - 24 hour     Equipment Recommendations  Other (comment)(TBD )    Recommendations for Other Services Rehab consult     Precautions / Restrictions Precautions Precautions: Fall;Other (comment) Precaution Comments: abd flap, Lft hemiparesis Required Braces or Orthoses: Other Brace Other Brace: helmet  Restrictions Weight Bearing Restrictions: No    Mobility  Bed Mobility Overal bed mobility: Needs Assistance Bed Mobility: Sit to Supine;Rolling;Sidelying to Sit Rolling: Max assist;+2 for physical assistance;+2 for safety/equipment Sidelying to sit: Max assist;+2 for physical assistance;HOB elevated   Sit to supine: Total assist;+2 for physical assistance   General bed mobility comments: Pt rolling towards L side and RUE guided to bedrail to assist with supine to sitting EOB. Pt very fatigued after a 5 mins of sitting EOB with intermittent assist for dynamic sitting balance.  Transfers                 General transfer comment: deferred; pt not safe to leave up in chair at this time  Ambulation/Gait                  Stairs             Wheelchair Mobility    Modified Rankin (Stroke Patients Only)       Balance Overall balance assessment: Needs assistance Sitting-balance support: Feet supported;Single extremity supported Sitting balance-Leahy Scale: Zero Sitting balance - Comments: poor head control and max A required to maintain sitting balance EOB; approximation through L UE                                     Cognition Arousal/Alertness: Lethargic Behavior During Therapy: Flat affect Overall Cognitive Status: Impaired/Different from baseline Area of Impairment: Following commands;Awareness;Attention                   Current Attention Level: Focused   Following Commands: Follows one step commands inconsistently   Awareness: Intellectual          Exercises      General Comments General comments (skin integrity, edema, etc.): pt snapping fingers and tapping foot when jazz playing      Pertinent Vitals/Pain Pain Assessment: Faces Faces Pain Scale: Hurts a little bit Pain Location: neck Pain Descriptors / Indicators: Grimacing Pain Intervention(s): Limited activity within patient's tolerance;Monitored during session;Repositioned    Home Living                      Prior Function  PT Goals (current goals can now be found in the care plan section) Acute Rehab PT Goals Patient Stated Goal: unable Progress towards PT goals: Progressing toward goals    Frequency    Min 4X/week      PT Plan Current plan remains appropriate    Co-evaluation PT/OT/SLP Co-Evaluation/Treatment: Yes Reason for Co-Treatment: Complexity of the patient's impairments (multi-system involvement);Necessary to address cognition/behavior during functional activity;For patient/therapist safety;To address functional/ADL transfers   OT goals addressed during session: ADL's and self-care      AM-PAC PT "6 Clicks" Mobility    Outcome Measure  Help needed turning from your back to your side while in a flat bed without using bedrails?: A Lot Help needed moving from lying on your back to sitting on the side of a flat bed without using bedrails?: Total Help needed moving to and from a bed to a chair (including a wheelchair)?: Total Help needed standing up from a chair using your arms (e.g., wheelchair or bedside chair)?: Total Help needed to walk in hospital room?: Total Help needed climbing 3-5 steps with a railing? : Total 6 Click Score: 7    End of Session   Activity Tolerance: Patient tolerated treatment well Patient left: in bed;with call bell/phone within reach;with bed alarm set Nurse Communication: Mobility status;Need for lift equipment PT Visit Diagnosis: Hemiplegia and hemiparesis;Other symptoms and signs involving the nervous system (R29.898);Other abnormalities of gait and mobility (R26.89) Hemiplegia - Right/Left: Left Hemiplegia - dominant/non-dominant: Non-dominant Hemiplegia - caused by: Cerebral infarction     Time: [id number] PT Time Calculation (min) (ACUTE ONLY): 30 min  Charges:  $Therapeutic Activity: 8-22 mins                     Earney Navy, PTA Acute Rehabilitation Services Pager: [phone] Office: [phone]     Darliss Cheney 07/28/2019, 3:37 PM

## 2019-07-28 NOTE — Progress Notes (Addendum)
[place] for Heparin Indication: DVT  Allergies  Allergen Reactions  . Hydrocodone Nausea Only    Patient Measurements: Height: 5\' 11"  (180.3 cm) Weight: 219 lb 12.8 oz (99.7 kg) IBW/kg (Calculated) : 75.3 Heparin Dosing Weight: 96.6  Vital Signs: Temp: 98.5 F (36.9 C) (09/03 0753) Temp Source: Axillary (09/03 0753) BP: 145/82 (09/03 0753) Pulse Rate: 82 (09/03 0753)  Labs: Recent Labs    07/26/19 0600  07/27/19 0500 07/27/19 1515 07/28/19 0516  HGB 12.2*  --  12.6*  --  12.5*  HCT 38.3*  --  37.9*  --  37.4*  PLT 189  --  218  --  219  HEPARINUNFRC  --    < > 0.56 0.44 0.46  CREATININE 0.86  --  0.87  --  0.99  CKTOTAL 1,797*  --  1,574*  --   --    < > = values in this interval not displayed.    Estimated Creatinine Clearance: 118.2 mL/min (by C-G formula based on SCr of 0.99 mg/dL).   Medical History: Past Medical History:  Diagnosis Date  . 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 Incidental finding of DVT and IVC filter was placed because patient was not a candidate for anticoagulation due to craniectomy. Neurosurgery cleared patient to initiate anticoagulation. Scr 0.86, CBC WNL and no blood products given.   Heparin level this morning within lower goal range (0.46).  No overt bleeding or complications noted.  Goal of Therapy:  Heparin level 0.3-0.5 units/mL  Monitor platelets by anticoagulation protocol: Yes   Plan:  Continue IV heparin at 1400 units/hr. Daily heparin level and CBC. F/u plans for oral anticoagulation as able.  [place], [place] 07/28/2019 8:15 AM

## 2019-07-28 NOTE — Progress Notes (Signed)
STROKE TEAM PROGRESS NOTE   INTERVAL HISTORY Pt lying in bed, eyes open, right gaze, nonverbal, did not following commands. Low grade fever overnight, CXR neg. Still has tachycardia, will give IVF bolus and increase free water.  Continue empiric antibiotics.  Add low-dose metoprolol.  Repeat CT no change of hematoma.  Discussed with wife over the phone.  PT/OT now recommend SNF.  Vitals:   08/01/19 0254 08/01/19 0400 08/01/19 0700 08/01/19 0830  BP:  (!) 149/95 (!) 162/89   Pulse:  (!) 109 (!) 116 99  Resp:  17 17   Temp: 100 F (37.8 C) 97.6 F (36.4 C) 99.6 F (37.6 C)   TempSrc: Axillary Oral Axillary   SpO2:  100% 99%   Weight:      Height:        CBC:  Recent Labs  Lab 07/30/19 0434 07/31/19 0715 08/01/19 0450  WBC 16.1* 15.2* 13.5*  NEUTROABS 11.5*  --   --   HGB 12.0* 13.8 12.2*  HCT 36.5* 42.4 37.5*  MCV 86.1 87.1 87.4  PLT 236 PLATELET CLUMPS NOTED ON SMEAR, UNABLE TO ESTIMATE [id number]    Basic Metabolic Panel:  Recent Labs  Lab 07/31/19 0715 08/01/19 0450  NA 139 140  K 4.4 4.3  CL 103 104  CO2 25 25  GLUCOSE 126* 136*  BUN 22* 23*  CREATININE 0.95 1.14  CALCIUM 9.3 8.8*    IMAGING Ct Head Wo Contrast Ct Cervical Spine Wo Contrast  07/19/2019 1132 1. 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 2. No associated hemorrhage and mild intracranial mass effect at this time, including trace leftward midline shift. 3. Evidence of large vessel occlusion: Hyperdensity of the right ICA terminus, the right A1 and MCA. 4. Unaffected brain parenchyma appears negative. 5.  No acute traumatic injury identified in the cervical spine.   Mr Brain 56 Contrast Mr Angio Head Wo Contrast Mr Angio Neck Wo Contrast 07/19/2019 1330 1. 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 The right ICA terminus, right ACA and MCA also appear occluded. 2. 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 Cytotoxic  edema but no convincing hemorrhage. Stable mild mass effect with trace leftward midline shift. 3. Intracranial MRA is degraded by motion artifact, but no other large vessel occlusion is suspected. There appears to be lobes reperfusion of the right PCA. 4. The left hemisphere and posterior fossa brain parenchyma appears normal.   Ct Head Wo Contrast 07/19/2019 1532 Similar appearance to earlier. Low-density and swelling of the right hemisphere in the ACA and MCA territories with mass effect and right-to-left shift of 5-6 mm. No hemorrhagic transformation at this time.   Ct Head Wo Contrast 07/20/2019 0444 1. Acute right ACA and MCA territory infarct with progressive swelling that has decompressed through the craniectomy defect. 2. No acute hemorrhage or new infarction.   Ct Angio Head W Or Wo Contrast Ct Angio Neck W Or Wo Contrast 07/21/2019 1. Stable from prior MRA. 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 No evidence of atherosclerosis or vasculopathy in the other vessels. 2. Cytotoxic edema causes 5 mm of midline shift and brain bulging through the craniectomy defect. Mild petechial hemorrhage is seen at the basal ganglia.   CT Head WO Contrast 07/23/2019 1. Unchanged appearance of massive right MCA and ACA territory infarcts with parenchyma extending through decompressive craniectomy. 2. Small focus  of suspected hemorrhage adjacent to the right caudate head.  CT Head WO Contrast  07/30/2019 1. Decreasing mass effect within large right anterior MCA and ACA territory infarct. 2. Midline shift is no longer present. There is decreased effacement of the right lateral ventricle. 3. Infarcted brain tissue still herniates through the craniectomy site. 4. New parenchymal hemorrhage within the infarcted tissue anteriorly measures 2.5 x 2.3 x 2.7 cm. 5. No new infarct.  Ct Head Wo Contrast 08/01/2019  1. Stable head CT since 07/30/2019. 2. Again noted  is a large infarct involving the right MCA and right ACA territories with brain tissue herniating through the right craniotomy defect. 3. Parenchymal hemorrhage in the right anterior cortex has minimally changed. Stable petechial hemorrhage in the right basal ganglia region.   Dg Chest Port 1 View  08/01/2019 No acute disease   2D Echocardiogram  1. The left ventricle has normal systolic function with an ejection fraction of 60-65%. The cavity size was normal. Left ventricular diastolic parameters were normal.  2. The right ventricle has normal systolic function. The cavity was normal. There is no increase in right ventricular wall thickness.  3. The pericardial effusion is circumferential.  4. Trivial pericardial effusion is present.  5. The mitral valve is grossly normal.  6. The tricuspid valve is grossly normal.  7. The aortic valve is tricuspid. No stenosis of the aortic valve.  8. The aorta is normal unless otherwise noted.  9. The aortic root is normal in size and structure. 10. No cardiac source of embolism identified. 11. When compared to the prior study: No comparison.  LE Dopplers Right: Findings consistent with acute deep vein thrombosis involving the right peroneal veins. Left: Findings consistent with acute deep vein thrombosis involving the left popliteal vein, left posterior tibial veins, and left peroneal veins.  Vas [place] Transcranial Doppler W Bubbles 07/25/2019 No HITS heard heard at rest. No HITS heard heard during valsalva.    PHYSICAL EXAM   General - Well nourished, well developed, middle-aged African-American male.  He has right hemicraniectomy surgical incision on the scalp.  Ophthalmologic - fundi not visualized due to noncooperation.  Cardiovascular - Regular rate and rhythm.  Neuro - patient is awake, eyes open. Nonverbal, not following simple commands. Eyes in right gaze position, not blinking to visual threat to the left, PERRL. Left facial droop.   Tongue protrusion not corporative.  Has spontaneous movement of RUE and RLE, 3-4/5 at least. On pain stimulation, mild withdraw of RLE, but no movement of LUE. DTR 1+ and no babinski. Sensation, coordination and gait not tested.   ASSESSMENT/PLAN Mr. BRADLEE LEBER is a 43 y.o. male with no significant past medical history found down x 2 days nonverbal with L hemiparesis.   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 Trace L midline shift. ELVO at R ICA, R A1, R MCA.  MRI  Large R brain infarct sparing R PCA. Cytotoxic edema but no hemorrhage. Stable trace midline shift.  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 R ICA terminus, R ACA, R MCA occluded.   CT head similar w/ low density and sweddling R ACA and MCA territories, now with 5-47mm midline shift. no hemorrhage  CT head acute R ACA and MCA infarct w/ progressive  swelling decompressed through craniectomy defect.   CTA head and neck stable MLS. R ICA occlusion in neck that continues into R ACA and MCA. Mild petechial hemorrhage at basal ganglia  CT Head 8/29 - Unchanged appearance of massive right MCA and ACA territory infarcts with parenchyma extending through decompressive craniectomy. Small focus of suspected hemorrhage adjacent to the right caudate head.  CT Head WO - 07/30/19 - New hematoma within the infarcted tissue   CT repeat 08/01/2019 stable w/o change  2D Echo EF 60-65%  LE venous doppler DVT in R peroneal, L popliteal, L posterior tibial and L peroneal  LE venous doppler repeat pending  TCD bubble no HITS, no PFO  LDL 83  HgbA1c 5.4  UDS positive THC  Hypercoagulable and autoimmune work up negative  Heparin subq for VTE prophylaxis  No antithrombotic prior to admission, was on heparin IV but now  discontinued due to hemorrhagic conversion with hematoma  Therapy recommendations:  SNF  Disposition:  pending   Cyctotoxic cerebral edema  S/p R decompressive hemicraniectomy (Nundkumar) w/ flap R abd 07/19/2019  PICC placed - keep for now per Leonie Man  Given One dose of 23.4% 8/26; 3%  off 8/30 1700  Na 146 -> 140  Monitor Na daily  CT repeat 07/30/19 - New parenchymal hemorrhage within the infarcted tissue  Off helmet to further release pressure  CT repeat 08/01/2019 stable w/o change  ?? Right ICA dissection  MRA and CTA showed right ICA occlusion from origin to terminal  Wife denies any head trauma  Wife stated that pt had recent aggressive exercise with weight lifting  Concerning dissection as working diagnosis of stroke etiology   B LE DVT  LE venous doppler DVT in R peroneal, L popliteal, L posterior tibial and L peroneal  Etiology unclear  Started iv heparin 07/26/2019  IVC filter placed 8/27. Plan retrieval in 8-12 weeks as an IP in an IR clinic  Hypercoagulable work up negative   Off SCDs  Was on IV Heparin but now off secondary to new hemorrhage  LE venous doppler repeat pending  Acute Respiratory Failure with inadequate airway protection  Intubated for airway protection  Now extubated  CXR no pneumonia or infiltration  Intermittent coughing  Seizure-like activity  Continue Keppra   EEG continuous slowing, excessive beta activity, sleep spindle asymmetry decreased R->related to stroke and sedation. No SZ  Long Term EEG cortical dysfunction in right frontal region  Seizure precautions  Febrile, Leukocytosis  Tmax - 101.8->99.7->101.8->101.5->99.6  WBC - 11.8->14.5->16.1->13.5  CXR 08/01/2019 NAD  U/A x 2 - negative  Blood cultures - NGTD  on empiric unasyn  Lactic acid 1.2->2.0  Tachycardia   HR 110-130  likely due to fever and dehydration  NS 500cc bolus  Increase free water to 200 every 4h  Put on metoprolol 25->50  twice daily  Check EKG  Troponin series   Consider cardiology consult if not improving  Hyperlipidemia  Home meds:  no statin  LDL 83, goal < 70  Was on lipitor 20 mg daily   D/c statin due to elevated LFT  Dysphagia . Secondary to stroke . NPO  Cortrak w/ TF @ 55cc  On free water 200cc q4  Other Stroke Risk Factors  Former Cigarette smoker, quit 3 yrs ago  ETOH use  Substance abuse UDS - positive for THC   Obesity, Body mass index is 30.19 kg/m., recommend weight loss, diet and exercise as appropriate   Other Active Problems  Elevated  LFTs -  AST - 132->219->97  ; ALT - 68->244->174  - improving   Hypokalemia - 3.5->3.4 - supplement ->3.5- 3.2->3.4 - replace  Hospital day # 13  I spent 17minutes in total face-to-face time with the patient, more than 50% of which was spent in counseling and coordination of care, reviewing test results, images and medication, and discussing the diagnosis of continued fever, lethargy, elevated LFT, large right MCA infarct cerebral edema LE DVT, and tachycardia, treatment plan and potential prognosis. This patient's care requiresreview of multiple databases, neurological assessment, and medical decision making of high complexity. I had long discussion with wife over the phone, updated pt current condition, treatment plan and potential prognosis, and answered all her questions.  She expressed understanding and appreciation.    Rosalin Hawking, MD PhD Stroke Neurology 08/01/2019 5:55 PM   To contact Stroke Continuity provider, please refer to [email or web address] After hours, contact General Neurology

## 2019-07-29 LAB — GLUCOSE, CAPILLARY
Glucose-Capillary: 100 mg/dL — ABNORMAL HIGH (ref 70–99)
Glucose-Capillary: 114 mg/dL — ABNORMAL HIGH (ref 70–99)
Glucose-Capillary: 114 mg/dL — ABNORMAL HIGH (ref 70–99)
Glucose-Capillary: 117 mg/dL — ABNORMAL HIGH (ref 70–99)
Glucose-Capillary: 126 mg/dL — ABNORMAL HIGH (ref 70–99)
Glucose-Capillary: 146 mg/dL — ABNORMAL HIGH (ref 70–99)
Glucose-Capillary: 95 mg/dL (ref 70–99)

## 2019-07-29 LAB — HEPARIN LEVEL (UNFRACTIONATED): Heparin Unfractionated: 0.57 IU/mL (ref 0.30–0.70)

## 2019-07-29 LAB — CBC
HCT: 36.9 % — ABNORMAL LOW (ref 39.0–52.0)
Hemoglobin: 12.2 g/dL — ABNORMAL LOW (ref 13.0–17.0)
MCH: 28.6 pg (ref 26.0–34.0)
MCHC: 33.1 g/dL (ref 30.0–36.0)
MCV: 86.4 fL (ref 80.0–100.0)
Platelets: 239 10*3/uL (ref 150–400)
RBC: 4.27 MIL/uL (ref 4.22–5.81)
RDW: 13.8 % (ref 11.5–15.5)
WBC: 14.5 10*3/uL — ABNORMAL HIGH (ref 4.0–10.5)
nRBC: 0 % (ref 0.0–0.2)

## 2019-07-29 LAB — URINALYSIS, ROUTINE W REFLEX MICROSCOPIC
Bilirubin Urine: NEGATIVE
Glucose, UA: NEGATIVE mg/dL
Hgb urine dipstick: NEGATIVE
Ketones, ur: NEGATIVE mg/dL
Leukocytes,Ua: NEGATIVE
Nitrite: NEGATIVE
Protein, ur: NEGATIVE mg/dL
Specific Gravity, Urine: 1.029 (ref 1.005–1.030)
pH: 5 (ref 5.0–8.0)

## 2019-07-29 NOTE — Progress Notes (Signed)
STROKE TEAM PROGRESS NOTE   INTERVAL HISTORY Patient remains  sleepy but can be aroused and follows occasional commands on right side.He is getting tube feeds. Serum potassium is low at 3.2 He is breathing well and handling his airway.Tmax 100.4 WBC 14.5.  Chest x-ray showed improvement in atelectasis and UA was negative.  Etiology of low-grade fever is unclear Vitals:   07/28/19 2349 07/29/19 0525 07/29/19 0700 07/29/19 1100  BP: (!) 128/104 (!) 146/85 (!) 159/90 (!) 159/91  Pulse: (!) 101 96 (!) 102 (!) 102  Resp: 18 18 18 18   Temp: 97.9 F (36.6 C) (!) 100.4 F (38 C) 98.8 F (37.1 C) 98.4 F (36.9 C)  TempSrc: Oral Oral Axillary Axillary  SpO2: 100% 99% 100% 100%  Weight:      Height:        CBC:  Recent Labs  Lab 07/28/19 0516 07/29/19 0457  WBC 13.5* 14.5*  HGB 12.5* 12.2*  HCT 37.4* 36.9*  MCV 85.0 86.4  PLT 219 [id number]    Basic Metabolic Panel:  Recent Labs  Lab 07/23/19 0459  07/27/19 0500 07/28/19 0516  NA 159*   < > 142 141  K 3.4*   < > 3.2* 3.4*  CL 127*   < > 107 104  CO2 24   < > 23 24  GLUCOSE 130*   < > 120* 125*  BUN 17   < > 26* 30*  CREATININE 0.97   < > 0.87 0.99  CALCIUM 8.3*   < > 8.6* 8.8*  MG 2.2  --   --   --   PHOS 3.8  --   --   --    < > = values in this interval not displayed.    IMAGING Ct Head Wo Contrast Ct Cervical Spine Wo Contrast  07/19/2019 1132 1. 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 2. No associated hemorrhage and mild intracranial mass effect at this time, including trace leftward midline shift. 3. Evidence of large vessel occlusion: Hyperdensity of the right ICA terminus, the right A1 and MCA. 4. Unaffected brain parenchyma appears negative. 5.  No acute traumatic injury identified in the cervical spine.   Mr Brain 31 Contrast Mr Angio Head Wo Contrast Mr Angio Neck Wo Contrast 07/19/2019 1330 1. 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 The right ICA  terminus, right ACA and MCA also appear occluded. 2. 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 Cytotoxic edema but no convincing hemorrhage. Stable mild mass effect with trace leftward midline shift. 3. Intracranial MRA is degraded by motion artifact, but no other large vessel occlusion is suspected. There appears to be lobes reperfusion of the right PCA. 4. The left hemisphere and posterior fossa brain parenchyma appears normal.   Ct Head Wo Contrast 07/19/2019 1532 Similar appearance to earlier. Low-density and swelling of the right hemisphere in the ACA and MCA territories with mass effect and right-to-left shift of 5-6 mm. No hemorrhagic transformation at this time.   Ct Head Wo Contrast 07/20/2019 0444 1. Acute right ACA and MCA territory infarct with progressive swelling that has decompressed through the craniectomy defect. 2. No acute hemorrhage or new infarction.   Ct Angio Head W Or Wo Contrast Ct Angio Neck W Or Wo Contrast 07/21/2019 1. Stable from prior MRA. 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 No evidence of atherosclerosis  or vasculopathy in the other vessels. 2. Cytotoxic edema causes 5 mm of midline shift and brain bulging through the craniectomy defect. Mild petechial hemorrhage is seen at the basal ganglia.   CT Head WO Contrast 07/23/2019 1. Unchanged appearance of massive right MCA and ACA territory infarcts with parenchyma extending through decompressive craniectomy. 2. Small focus of suspected hemorrhage adjacent to the right caudate head.  Chest 1 View Portable  07/23/2019 Worsened atelectasis in the right lower lobe. Right arm PICC tip in the lower right atrium.  2D Echocardiogram  1. The left ventricle has normal systolic function with an ejection fraction of 60-65%. The cavity size was normal. Left ventricular diastolic parameters were normal.  2. The right ventricle has normal systolic  function. The cavity was normal. There is no increase in right ventricular wall thickness.  3. The pericardial effusion is circumferential.  4. Trivial pericardial effusion is present.  5. The mitral valve is grossly normal.  6. The tricuspid valve is grossly normal.  7. The aortic valve is tricuspid. No stenosis of the aortic valve.  8. The aorta is normal unless otherwise noted.  9. The aortic root is normal in size and structure. 10. No cardiac source of embolism identified. 11. When compared to the prior study: No comparison.  LE Dopplers Right: Findings consistent with acute deep vein thrombosis involving the right peroneal veins. Left: Findings consistent with acute deep vein thrombosis involving the left popliteal vein, left posterior tibial veins, and left peroneal veins.  Vas [place] Transcranial Doppler W Bubbles 07/25/2019 No HITS heard heard at rest. No HITS heard heard during valsalva.   PHYSICAL EXAM   General - Well nourished, well developed, middle-aged African-American male.  He has right hemicraniectomy surgical incision on the scalp. Ophthalmologic - fundi not visualized due to noncooperation. Lungs clear to auscultation. Cardiovascular - Regular rate and rhythm.  Neurological Exam-patient is drowsy, eyes close, +following commands on the right. With forced eye opening, eyes in right gaze position, not blinking to visual threat, doll's eyes sluggish, not tracking, PERRL. Corneal reflex present weakly bilaterally, gag and cough present.  Facial symmetry not able to test due to ET tube.  Tongue protrusion not cooperative. Has spontaneous movement of RUE and RLE. On pain stimulation, mild withdraw of RUE and RLE. But no movement of LUE and LLE. DTR 1+ and no babinski. Sensation, coordination and gait not tested.   ASSESSMENT/PLAN Matthew Black is a 43 y.o. male with no significant past medical history found down x 2 days nonverbal with L hemiparesis.   Stroke:  R MCA/ACA  infarct w/ R ICA, R A1, R MCA occlusion w/ cerebral edema s/p hemicraniectomy w/ abd flap implant - etiology unclear  CT head large R brain infarct w/ edema R ACA and MCA territories. Trace L midline shift. ELVO at R ICA, R A1, R MCA.  MRI  Large R brain infarct sparing R PCA. Cytotoxic edema but no hemorrhage. Stable trace midline shift.  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 R ICA terminus, R ACA, R MCA occluded.   CT head similar w/ low density and sweddling R ACA and MCA territories, now with 5-19mm midline shift. no hemorrhage  CT head acute R ACA and MCA infarct w/ progressive swelling decompressed through craniectomy defect.   CTA head and neck stable MLS. R ICA occlusion in neck that continues into R ACA and MCA. Mild petechial hemorrhage at basal ganglia  CT Head 8/29 - Unchanged  appearance of massive right MCA and ACA territory infarcts with parenchyma extending through decompressive craniectomy. Small focus of suspected hemorrhage adjacent to the right caudate head.  2D Echo EF 60-65%  LE venous doppler DVT in R peroneal, L popliteal, L posterior tibial and L peroneal  TCD bubble no HITS  May consider TEE later once stable  LDL 83  HgbA1c 5.4  UDS positive THC  Hypercoagulable and autoimmune work up negative- lupus anticoagulant, antiphospholipid antibodies and homocystine are all normal Heparin subq for VTE prophylaxis  No antithrombotic prior to admission, now on aspirin 325mg  daily  Therapy recommendations:  CIR. Consult placed  Disposition:  pending   Soft Helmet ordered  Transfer to the floor  Cyctotoxic cerebral edema  S/p R decompressive hemicraniectomy (Nundkumar) w/ flap R abd 07/19/2019  On 3% saline @ 75  PICC placed - keep for now per Leonie Man  Given One dose of 23.4% 8/26  3%  off 8/30 1700  Na 146  Monitor Na daily  ?? Right ICA dissection  MRA and CTA showed right ICA occlusion from origin to terminal  Wife denies any head  trauma  Wife stated that pt had recent aggressive exercise with weight lifting  Concerning dissection as working diagnosis of stroke etiology   On ASA alone.  B LE DVT  LE venous doppler DVT in R peroneal, L popliteal, L posterior tibial and L peroneal  Etiology unclear  Started iv heparin 07/26/2019  IVC filter placed 8/27  Hypercoagulable work up negative   Off SCDs  Plan AC once stable  Acute Respiratory Failure with inadequate airway protection  Intubated for airway protection  Now extubated  Seizure-like activity  Continue Keppra   EEG continuous slowing, excessive beta activity, sleep spindle asymmetry decreased R->related to stroke and sedation. No SZ  Long Term EEG cortical dysfunction in right frontal region  Seizure precautions  Febrile, Leukocytosis  Tmax - 101.8  WBC - 11.8  Vomited PTA, could be due to aspiration  CXR NAD, repeat CXR yest unchanged x temp probe. CXR (07/23/19) -  Worsened atelectasis in the right lower lobe    Hyperlipidemia  Home meds:  no statin  LDL 83, goal < 70  Add lipitor 20 mg daily   Continue statin at discharge  Dysphagia . Secondary to stroke . NPO . Cortrak w/ TF   Other Stroke Risk Factors  Former Cigarette smoker, quit 3 yrs ago  ETOH use  Substance abuse UDS - positive for THC  Obesity, Body mass index is 30.66 kg/m., recommend weight loss, diet and exercise as appropriate   Other Active Problems  At risk for rhabdo CK 7533 -> 5,972->1,797. Hydrate as able. LDH 359. CRP 19.2.    Hypophosphatemia, hypomagnesemia - resolved-> Phos 3.8 (normal) ; Mg 2.2 (normal)  LFTs - AST - 132 (H) ; ALT - 68 (H) - recheck   Hypokalemia - 3.5->3.4 - supplement ->3.5- 3.2  Hospital day # 10  Plan : Continue iv heparin for acute DVT .Marland Kitchen Continue therapies and move to rehab in next few days if accepted.     Antony Contras, MD Medical Director [place] Pager: [phone] 07/29/2019 4:04  PM    To contact Stroke Continuity provider, please refer to [email or web address] After hours, contact General Neurology

## 2019-07-29 NOTE — Progress Notes (Signed)
Physical Therapy Treatment Patient Details Name: Matthew Black MRN: [id number] DOB: 17-Mar-1976 Today's Date: 07/29/2019    History of Present Illness Pt is a 43 y.o. M with no known PMH who presents on 8/25 after being found down at work, nonverbal with L hemiparesis. CT head showing large R hemisphere infarct with edema and mild mass effect. 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 Stable trace midline shift. S/p hemicraniectomy with abdominal flap implant. BLE DVT vasc US 8/28: acute DVT bilateral lower extremities, s/p IVC placement.     PT Comments    Patient received in room kicking right leg, restless. Unable to maintain attention on me or follow direction. No functional movement of L side at this time. After a couple of minutes of patient kicking, he then closed his eyes and appeared to fall asleep. No purposeful movement demonstrated today. Continue to work with patient acutely to improve functional mobility and independence as able.        Follow Up Recommendations  SNF     Equipment Recommendations  Other (comment)(TBD)    Recommendations for Other Services       Precautions / Restrictions Precautions Precautions: Fall Precaution Comments: L hemiparesis Required Braces or Orthoses: Other Brace Other Brace: helmet  Restrictions Weight Bearing Restrictions: No    Mobility  Bed Mobility Overal bed mobility: Needs Assistance Bed Mobility: Rolling;Supine to Sit;Sidelying to Sit Rolling: Total assist Sidelying to sit: Total assist Supine to sit: Total assist Sit to supine: Total assist   General bed mobility comments: Patient initially restless, kicking right leg, eyes open. Unable to respond to me or follow direction, then fell asleep.  Transfers                    Ambulation/Gait                 Stairs             Wheelchair Mobility    Modified Rankin (Stroke Patients Only) Modified Rankin  (Stroke Patients Only) Pre-Morbid Rankin Score: No symptoms Modified Rankin: Severe disability     Balance Overall balance assessment: Needs assistance   Sitting balance-Leahy Scale: Zero                                      Cognition Arousal/Alertness: Awake/alert Behavior During Therapy: Restless;Flat affect Overall Cognitive Status: Impaired/Different from baseline Area of Impairment: Attention;Awareness;Following commands;Safety/judgement               Rancho Levels of Cognitive Functioning Rancho Los Amigos Scales of Cognitive Functioning: Generalized response       Following Commands: Follows multi-step commands inconsistently Safety/Judgement: Decreased awareness of safety;Decreased awareness of deficits Awareness: Intellectual Problem Solving: Requires tactile cues;Requires verbal cues General Comments: patient not following commands this visit, no purposeful movement. Patient moving right side of body restlessly, kicking leg, gripping bed with right UE. No movement of left side.      Exercises Other Exercises Other Exercises: passive movement of left UE and LE, unable to follow direction. Flaccid Left side.    General Comments        Pertinent Vitals/Pain Pain Assessment: No/denies pain Faces Pain Scale: No hurt Pain Location: unable Pain Intervention(s): Monitored during session    Home Living  Prior Function            PT Goals (current goals can now be found in the care plan section) Acute Rehab PT Goals Patient Stated Goal: unable PT Goal Formulation: Patient unable to participate in goal setting Time For Goal Achievement: 08/07/19 Potential to Achieve Goals: Fair Progress towards PT goals: Not progressing toward goals - comment    Frequency    Min 4X/week      PT Plan Discharge plan needs to be updated    Co-evaluation              AM-PAC PT "6 Clicks" Mobility   Outcome  Measure  Help needed turning from your back to your side while in a flat bed without using bedrails?: Total Help needed moving from lying on your back to sitting on the side of a flat bed without using bedrails?: Total Help needed moving to and from a bed to a chair (including a wheelchair)?: Total Help needed standing up from a chair using your arms (e.g., wheelchair or bedside chair)?: Total Help needed to walk in hospital room?: Total Help needed climbing 3-5 steps with a railing? : Total 6 Click Score: 6    End of Session   Activity Tolerance: Patient limited by lethargy;Other (comment)(limited by cognition) Patient left: in bed;with bed alarm set Nurse Communication: Mobility status PT Visit Diagnosis: Hemiplegia and hemiparesis;Other symptoms and signs involving the nervous system (R29.898);Other abnormalities of gait and mobility (R26.89) Hemiplegia - Right/Left: Left Hemiplegia - dominant/non-dominant: Non-dominant Hemiplegia - caused by: Cerebral infarction     Time: 1002-1010 PT Time Calculation (min) (ACUTE ONLY): 8 min  Charges:  $Therapeutic Activity: 8-22 mins                     Dynasia Kercheval, PT, GCS 07/29/19,10:27 AM

## 2019-07-29 NOTE — Progress Notes (Signed)
Bedside Swallow assessment    07/26/19 1037  SLP Visit Information  SLP Received On 07/26/19  General Information  Date of Onset 07/19/19  HPI Pt is a 43 y.o. with no known PMH who presents on 8/25 after being found down at work, nonverbal with L hemiparesis. 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 Stable trace midline shift. S/p hemicraniectomy with abdominal flap implant. Intubated 8/26-8/31. CXR worsened atelectasis in the right lower lobe.  Type of Study Bedside Swallow Evaluation  Previous Swallow Assessment ` (none)  Diet Prior to this Study NPO  Temperature Spikes Noted Yes  Respiratory Status Room air  History of Recent Intubation Yes  Length of Intubations (days) 5 days  Date extubated 07/25/19  Behavior/Cognition Lethargic/Drowsy;Doesn't follow directions;Requires cueing  Oral Cavity Assessment Dried secretions  Oral Care Completed by SLP Yes  Oral Cavity - Dentition Adequate natural dentition  Vision Impaired for self-feeding  Self-Feeding Abilities Total assist  Patient Positioning Upright in bed  Baseline Vocal Quality Not observed;Other (comment) (no vocalizations)  Volitional Cough Cognitively unable to elicit  Pain Assessment  Pain Assessment Faces  Faces Pain Scale 0  Pain Intervention(s) Monitored during session  Oral Assessment (Complete on admission/transfer/change in patient condition)  Does patient have any of the following "high(er) risk" factors? Nutritional status - fluids only or NPO for >24 hours  Does patient have any of the following "at risk" factors? Saliva - thick, dry mouth;Oxygen therapy - cannula, mask, simple oxygen devices;Nutritional status - inadequate  Patient is HIGH RISK: Non-ventilated Order set for Adult Oral Care Protocol initiated - "High Risk Patients - Non-Ventilated" option selected  (see row information)  Oral Motor/Sensory Function  Overall Oral Motor/Sensory Function Moderate  impairment  Facial ROM Reduced left;Suspected CN VII (facial) dysfunction  Facial Symmetry Abnormal symmetry left;Suspected CN VII (facial) dysfunction  Facial Strength Reduced left;Suspected CN VII (facial) dysfunction  Lingual ROM  (TBA)  Ice Chips  Ice chips Impaired  Presentation Spoon  Oral Phase Impairments Poor awareness of bolus;Reduced lingual movement/coordination  Oral Phase Functional Implications Oral holding;Prolonged oral transit  Pharyngeal Phase Impairments Suspected delayed Swallow  Thin Liquid  Thin Liquid NT  Nectar Thick Liquid  Nectar Thick Liquid NT  Honey Thick Liquid  Honey Thick Liquid NT  Puree  Puree Impaired  Presentation Spoon  Oral Phase Impairments Reduced lingual movement/coordination;Poor awareness of bolus  Oral Phase Functional Implications Oral holding  Pharyngeal Phase Impairments  (none)  Solid  Solid NT  SLP - End of Session  Patient left in bed;with call bell/phone within reach;with bed alarm set;Other (comment) (With dietitian in room )  Nurse Communication Treatment plan;Diet recommendation  SLP Assessment  Clinical Impression Statement (ACUTE ONLY) Pt presented lethargic and did not attend to commands throughout BSE. Full OME unable to be completed due to decreased alertness, dried skin on lips were removed during oral care. Ice chips were administered with minimal manipulation of bolus and delayed swallow. Puree trial resulted in decreased awareness of bolus resulting in oral holding after max cueing. Towards the end of the study he produced a delayed cough, unable to determine which consistency prompted it. Dietitian entered towards the end of the evaluation to place a previously ordered NG tube. Recommend tube feedings with oral care before administration of ice chips. Follow up to reassess toleration of POs when more alert.   SLP Visit Diagnosis Dysphagia, unspecified (R13.10)  Impact on safety and function Risk for inadequate  nutrition/hydration;Severe aspiration risk  Other Related Risk Factors Lethargy;Cognitive impairment;Deconditioning  Swallow Evaluation Recommendations  SLP Diet Recommendations NPO  Medication Administration Via alternative means  Treatment Plan  Oral Care Recommendations Oral care QID  Other Recommendations Have oral suction available  Treatment Recommendations Therapy as outlined in treatment plan below  Follow up Recommendations 24 hour supervision/assistance  Speech Therapy Frequency (ACUTE ONLY) min 2x/week  Treatment Duration 2 weeks  Interventions Diet toleration management by SLP;Trials of upgraded texture/liquids;Patient/family education  Prognosis  Prognosis for Safe Diet Advancement Good  Barriers to Reach Goals Severity of deficits  Individuals Consulted  Consulted and Agree with Results and Recommendations Dietician;RN;Patient  Progression Toward Goals  Potential to Achieve Goals (ACUTE ONLY) Good  SLP Time Calculation  SLP Start Time (ACUTE ONLY) 1039  SLP Stop Time (ACUTE ONLY) 1050  SLP Time Calculation (min) (ACUTE ONLY) 11 min  SLP Evaluations  $ SLP Speech Visit 1 Visit  SLP Evaluations  $BSS Swallow 1 Procedure  Orbie Pyo Nerine Pulse M.Ed Risk analyst [phone] Office [phone]

## 2019-07-29 NOTE — Progress Notes (Addendum)
  Speech Language Pathology Treatment: Dysphagia;Cognitive-Linquistic  Patient Details Name: Matthew Black MRN: [id number] DOB: August 13, 1976 Today's Date: 07/29/2019 Time: [id number] SLP Time Calculation (min) (ACUTE ONLY): 22 min  Assessment / Plan / Recommendation Clinical Impression  Session focused on facilitation of swallow and communication-cognition. Head in a letf turn position and after positioning he was able keep mostly at midline for approximately 7 minutes. Shoulder squeeze frequently needed to sustain eye opening for 2 min increments. Opportunities for on step command provided with additional processing time, verbal and visual cues however not followed this session. When verbal direction paired with object (toothbrush) he followed out command with perseveration. He squeezed therapist's hand 3 times with verbal direction. He did not initiate vocalization in unison with SLP or when music played. Spontaneous slight throat clear before po trial. No oral manipulation with ice chip and eventually suctioned.  When spoon given to pt he was able to effectively direct to oral cavity and propelled applesauce with mod delays to initiate and what appeared to be full swallow x 2. When he can participate in po intake trials for longer periods, maintain alertness, manage secretions (holding saliva presently) he would be appropriate for instrumental assessment. Suspect this to be a prolonged process given severity of stroke and may need alternative nutrition longer than what [facility] can provided. Encouraged pt to increase awareness of saliva build up and perform swallows. Continue oral care in interim and ST to continue interventions.     HPI HPI: Pt is a 43 y.o. with no known PMH who presents on 8/25 after being found down at work, nonverbal with L hemiparesis. 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 Stable trace midline shift. S/p hemicraniectomy with abdominal  flap implant. Intubated 8/26-8/31. CXR worsened atelectasis in the right lower lobe.      SLP Plan  Continue with current plan of care       Recommendations  Diet recommendations: NPO Medication Administration: Via alternative means                Oral Care Recommendations: Oral care QID Follow up Recommendations: 24 hour supervision/assistance;Inpatient Rehab(if he is able to tolerate 3 hours tx) SLP Visit Diagnosis: Dysphagia, unspecified (R13.10);Cognitive communication deficit [id number]) Plan: Continue with current plan of care       GO                [place] Siren 07/29/2019, 9:41 AM  Orbie Pyo Colvin Caroli.Ed Risk analyst [phone] Office [phone]

## 2019-07-29 NOTE — Progress Notes (Signed)
ANTICOAGULATION CONSULT NOTE  Pharmacy Consult:  Heparin Indication: DVT  Allergies  Allergen Reactions  . Hydrocodone Nausea Only    Patient Measurements: Height: 5\' 11"  (180.3 cm) Weight: 219 lb 12.8 oz (99.7 kg) IBW/kg (Calculated) : 75.3 Heparin Dosing Weight: 96 kg  Vital Signs: Temp: 98.8 F (37.1 C) (09/04 0700) Temp Source: Axillary (09/04 0700) BP: 159/90 (09/04 0700) Pulse Rate: 102 (09/04 0700)  Labs: Recent Labs    07/27/19 0500 07/27/19 1515 07/28/19 0516 07/29/19 0457  HGB 12.6*  --  12.5* 12.2*  HCT 37.9*  --  37.4* 36.9*  PLT 218  --  219 239  HEPARINUNFRC 0.56 0.44 0.46 0.57  CREATININE 0.87  --  0.99  --   CKTOTAL 1,574*  --   --   --     Estimated Creatinine Clearance: 118.2 mL/min (by C-G formula based on SCr of 0.99 mg/dL).    Assessment: Patient had an acute ischemic stroke and he did not receive tPA. Incidental finding of DVT and IVC filter was placed because patient was not a candidate for anticoagulation due to craniectomy. Neurosurgery cleared patient to initiate IV heparin with plan to retrieve IVC filter eventually.  Heparin level is supra-therapeutic; no bleeding reported.  Goal of Therapy:  Heparin level 0.3-0.5 units/mL  Monitor platelets by anticoagulation protocol: Yes   Plan:  Reduce heparin gtt to 1300 units/hr Daily heparin level and CBC Follow up plan for oral anticoagulation  Alizae Bechtel D. Mina Marble, PharmD, BCPS, [place] 07/29/2019, 8:02 AM

## 2019-07-30 ENCOUNTER — Inpatient Hospital Stay (HOSPITAL_COMMUNITY): Payer: Medicaid Other

## 2019-07-30 DIAGNOSIS — R945 Abnormal results of liver function studies: Secondary | ICD-10-CM

## 2019-07-30 LAB — BASIC METABOLIC PANEL
Anion gap: 10 (ref 5–15)
BUN: 22 mg/dL — ABNORMAL HIGH (ref 6–20)
CO2: 24 mmol/L (ref 22–32)
Calcium: 9.2 mg/dL (ref 8.9–10.3)
Chloride: 106 mmol/L (ref 98–111)
Creatinine, Ser: 0.95 mg/dL (ref 0.61–1.24)
GFR calc Af Amer: >60 mL/min (ref >60)
GFR calc non Af Amer: >60 mL/min (ref >60)
Glucose, Bld: 128 mg/dL — ABNORMAL HIGH (ref 70–99)
Potassium: 3.9 mmol/L (ref 3.5–5.1)
Sodium: 140 mmol/L (ref 135–145)

## 2019-07-30 LAB — CBC WITH DIFFERENTIAL/PLATELET
Abs Immature Granulocytes: 0.13 10*3/uL — ABNORMAL HIGH (ref 0.00–0.07)
Basophils Absolute: 0.1 10*3/uL (ref 0.0–0.1)
Basophils Relative: 1 %
Eosinophils Absolute: 0.1 10*3/uL (ref 0.0–0.5)
Eosinophils Relative: 1 %
HCT: 36.5 % — ABNORMAL LOW (ref 39.0–52.0)
Hemoglobin: 12 g/dL — ABNORMAL LOW (ref 13.0–17.0)
Immature Granulocytes: 1 %
Lymphocytes Relative: 18 %
Lymphs Abs: 2.9 10*3/uL (ref 0.7–4.0)
MCH: 28.3 pg (ref 26.0–34.0)
MCHC: 32.9 g/dL (ref 30.0–36.0)
MCV: 86.1 fL (ref 80.0–100.0)
Monocytes Absolute: 1.5 10*3/uL — ABNORMAL HIGH (ref 0.1–1.0)
Monocytes Relative: 9 %
Neutro Abs: 11.5 10*3/uL — ABNORMAL HIGH (ref 1.7–7.7)
Neutrophils Relative %: 70 %
Platelets: 236 10*3/uL (ref 150–400)
RBC: 4.24 MIL/uL (ref 4.22–5.81)
RDW: 14 % (ref 11.5–15.5)
WBC: 16.1 10*3/uL — ABNORMAL HIGH (ref 4.0–10.5)
nRBC: 0 % (ref 0.0–0.2)

## 2019-07-30 LAB — URINALYSIS, ROUTINE W REFLEX MICROSCOPIC
Bilirubin Urine: NEGATIVE
Glucose, UA: NEGATIVE mg/dL
Hgb urine dipstick: NEGATIVE
Ketones, ur: NEGATIVE mg/dL
Leukocytes,Ua: NEGATIVE
Nitrite: NEGATIVE
Protein, ur: NEGATIVE mg/dL
Specific Gravity, Urine: 1.029 (ref 1.005–1.030)
pH: 5 (ref 5.0–8.0)

## 2019-07-30 LAB — GLUCOSE, CAPILLARY
Glucose-Capillary: 114 mg/dL — ABNORMAL HIGH (ref 70–99)
Glucose-Capillary: 117 mg/dL — ABNORMAL HIGH (ref 70–99)
Glucose-Capillary: 119 mg/dL — ABNORMAL HIGH (ref 70–99)
Glucose-Capillary: 133 mg/dL — ABNORMAL HIGH (ref 70–99)

## 2019-07-30 LAB — HEPARIN LEVEL (UNFRACTIONATED)
Heparin Unfractionated: <0.1 IU/mL — ABNORMAL LOW (ref 0.30–0.70)
Heparin Unfractionated: 0.19 IU/mL — ABNORMAL LOW (ref 0.30–0.70)
Heparin Unfractionated: 0.47 IU/mL (ref 0.30–0.70)

## 2019-07-30 MED ORDER — FREE WATER
200.0000 mL | Freq: Four times a day (QID) | Status: DC
  Administered 2019-07-30 – 2019-08-01 (×7): 200 mL

## 2019-07-30 NOTE — Progress Notes (Signed)
ANTICOAGULATION CONSULT NOTE  Pharmacy Consult:  Heparin Indication: DVT  Allergies  Allergen Reactions  . Hydrocodone Nausea Only    Patient Measurements: Height: 5\' 11"  (180.3 cm) Weight: 219 lb 12.8 oz (99.7 kg) IBW/kg (Calculated) : 75.3 Heparin Dosing Weight: 96 kg  Vital Signs: Temp: 97.6 F (36.4 C) (09/05 0700) Temp Source: Axillary (09/05 0700) BP: 124/105 (09/05 0700) Pulse Rate: 101 (09/05 0700)  Labs: Recent Labs    07/28/19 0516 07/29/19 0457 07/30/19 0433 07/30/19 0434 07/30/19 1247  HGB 12.5* 12.2*  --  12.0*  --   HCT 37.4* 36.9*  --  36.5*  --   PLT 219 239  --  236  --   HEPARINUNFRC 0.46 0.57 <0.10*  --  0.19*  CREATININE 0.99  --   --  0.95  --     Estimated Creatinine Clearance: 123.2 mL/min (by C-G formula based on SCr of 0.95 mg/dL).    Assessment: Patient had an acute ischemic stroke and he did not receive tPA. Incidental finding of DVT and IVC filter was placed because patient was not a candidate for anticoagulation due to craniectomy. Neurosurgery cleared patient to initiate IV heparin with plan to retrieve IVC filter eventually.  Heparin level this afternoon remains SUBtherapeutic however per discussion with the RN, the IV site was leaking and the site had to be changed. She estimated that the drip was off for ~1 hour and re-initiated an hour prior to the lab being drawn. She noted that she thinks that the leaking started during her shift. Will not adjust heparin, will recheck a level 6 hours from re-initiation.   Goal of Therapy:  Heparin level 0.3-0.5 units/mL  Monitor platelets by anticoagulation protocol: Yes   Plan:  - Continue Heparin at 1450 units/hr (14.5 ml/hr) - Check heparin level 6 hours after re-initiated this AM - Daily heparin level and CBC - Follow up plan for oral anticoagulation  Thank you for allowing pharmacy to be a part of this patient's care.  Alycia Rossetti, PharmD, BCPS Clinical Pharmacist Clinical  phone for 07/30/2019: [phone] 07/30/2019 1:40 PM   **Pharmacist phone directory can now be found on [email or web address] ([email or web address]).  Listed under [place].

## 2019-07-30 NOTE — Progress Notes (Signed)
ANTICOAGULATION CONSULT NOTE  Pharmacy Consult:  Heparin Indication: DVT  Allergies  Allergen Reactions  . Hydrocodone Nausea Only    Patient Measurements: Height: 5\' 11"  (180.3 cm) Weight: 219 lb 12.8 oz (99.7 kg) IBW/kg (Calculated) : 75.3 Heparin Dosing Weight: 96 kg  Vital Signs: Temp: 98.8 F (37.1 C) (09/05 1808) Temp Source: Oral (09/05 1808) BP: 134/94 (09/05 1500) Pulse Rate: 112 (09/05 1500)  Labs: Recent Labs    07/28/19 0516 07/29/19 0457 07/30/19 0433 07/30/19 0434 07/30/19 1247 07/30/19 1807  HGB 12.5* 12.2*  --  12.0*  --   --   HCT 37.4* 36.9*  --  36.5*  --   --   PLT 219 239  --  236  --   --   HEPARINUNFRC 0.46 0.57 <0.10*  --  0.19* 0.47  CREATININE 0.99  --   --  0.95  --   --     Estimated Creatinine Clearance: 123.2 mL/min (by C-G formula based on SCr of 0.95 mg/dL).    Assessment: Patient had an acute ischemic stroke and he did not receive tPA. Incidental finding of DVT and IVC filter was placed because patient was not a candidate for anticoagulation due to craniectomy. Neurosurgery cleared patient to initiate IV heparin with plan to retrieve IVC filter eventually.  Heparin now therapeutic after continuing infusion rate.  Goal of Therapy:  Heparin level 0.3-0.5 units/mL  Monitor platelets by anticoagulation protocol: Yes   Plan:  - Continue Heparin at 1450 units/hr (14.5 ml/hr) - Daily heparin level and CBC - Follow up plan for oral anticoagulation  Arrie Senate, PharmD, BCPS Clinical Pharmacist [phone] Please check AMION for all [place] numbers 07/30/2019

## 2019-07-30 NOTE — Progress Notes (Addendum)
STROKE TEAM PROGRESS NOTE   INTERVAL HISTORY Patient remains sleepy but can be aroused and follows occasional commands on right side. He is on tube feeding and added free water today. Still has fever and leukocytosis. Repeat UA and CXR. Had blood culture also.   Vitals:   07/29/19 1100 07/29/19 1945 07/29/19 2337 07/30/19 0359  BP: (!) 159/91 (!) 148/98 140/82 (!) 159/99  Pulse: (!) 102 (!) 107 (!) 113 (!) 103  Resp: 18 18 18 18   Temp: 98.4 F (36.9 C) 99.7 F (37.6 C) 97.9 F (36.6 C) 99.7 F (37.6 C)  TempSrc: Axillary Oral  Oral  SpO2: 100% 100% 100% 100%  Weight:      Height:        CBC:  Recent Labs  Lab 07/29/19 0457 07/30/19 0434  WBC 14.5* 16.1*  NEUTROABS  --  11.5*  HGB 12.2* 12.0*  HCT 36.9* 36.5*  MCV 86.4 86.1  PLT 239 [id number]    Basic Metabolic Panel:  Recent Labs  Lab 07/28/19 0516 07/30/19 0434  NA 141 140  K 3.4* 3.9  CL 104 106  CO2 24 24  GLUCOSE 125* 128*  BUN 30* 22*  CREATININE 0.99 0.95  CALCIUM 8.8* 9.2    IMAGING Ct Head Wo Contrast Ct Cervical Spine Wo Contrast  07/19/2019 1132 1. 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 2. No associated hemorrhage and mild intracranial mass effect at this time, including trace leftward midline shift. 3. Evidence of large vessel occlusion: Hyperdensity of the right ICA terminus, the right A1 and MCA. 4. Unaffected brain parenchyma appears negative. 5.  No acute traumatic injury identified in the cervical spine.   Mr Brain 52 Contrast Mr Angio Head Wo Contrast Mr Angio Neck Wo Contrast 07/19/2019 1330 1. 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 The right ICA terminus, right ACA and MCA also appear occluded. 2. 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 Cytotoxic edema but no convincing hemorrhage. Stable mild mass effect with trace leftward midline shift. 3. Intracranial MRA is degraded by motion artifact, but no other  large vessel occlusion is suspected. There appears to be lobes reperfusion of the right PCA. 4. The left hemisphere and posterior fossa brain parenchyma appears normal.   Ct Head Wo Contrast 07/19/2019 1532 Similar appearance to earlier. Low-density and swelling of the right hemisphere in the ACA and MCA territories with mass effect and right-to-left shift of 5-6 mm. No hemorrhagic transformation at this time.   Ct Head Wo Contrast 07/20/2019 0444 1. Acute right ACA and MCA territory infarct with progressive swelling that has decompressed through the craniectomy defect. 2. No acute hemorrhage or new infarction.   Ct Angio Head W Or Wo Contrast Ct Angio Neck W Or Wo Contrast 07/21/2019 1. Stable from prior MRA. 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 No evidence of atherosclerosis or vasculopathy in the other vessels. 2. Cytotoxic edema causes 5 mm of midline shift and brain bulging through the craniectomy defect. Mild petechial hemorrhage is seen at the basal ganglia.   CT Head WO Contrast 07/23/2019 1. Unchanged appearance of massive right MCA and ACA territory infarcts with parenchyma extending through decompressive craniectomy. 2. Small focus of suspected hemorrhage adjacent to the right caudate head.  Chest 1 View Portable  07/23/2019 Worsened atelectasis in the right lower lobe. Right arm PICC tip in the lower  right atrium.  2D Echocardiogram  1. The left ventricle has normal systolic function with an ejection fraction of 60-65%. The cavity size was normal. Left ventricular diastolic parameters were normal.  2. The right ventricle has normal systolic function. The cavity was normal. There is no increase in right ventricular wall thickness.  3. The pericardial effusion is circumferential.  4. Trivial pericardial effusion is present.  5. The mitral valve is grossly normal.  6. The tricuspid valve is grossly normal.  7. The aortic valve  is tricuspid. No stenosis of the aortic valve.  8. The aorta is normal unless otherwise noted.  9. The aortic root is normal in size and structure. 10. No cardiac source of embolism identified. 11. When compared to the prior study: No comparison.  LE Dopplers Right: Findings consistent with acute deep vein thrombosis involving the right peroneal veins. Left: Findings consistent with acute deep vein thrombosis involving the left popliteal vein, left posterior tibial veins, and left peroneal veins.  Vas [place] Transcranial Doppler W Bubbles 07/25/2019 No HITS heard heard at rest. No HITS heard heard during valsalva.   PHYSICAL EXAM   General - Well nourished, well developed, middle-aged African-American male.  He has right hemicraniectomy surgical incision on the scalp.  Ophthalmologic - fundi not visualized due to noncooperation.  Cardiovascular - Regular rate and rhythm.  Neuro - patient is drowsy, eyes close, but open on voice and pain. Nonverbal, following limited commands on the right. Eyes in right gaze position, not blinking to visual threat to the left, PERRL. Left facial droop.  Tongue protrusion not corporative.  Has spontaneous movement of RUE and RLE, 3-4/5 at least. On pain stimulation, mild withdraw of RLE, but no movement of LUE. DTR 1+ and no babinski. Sensation, coordination and gait not tested.   ASSESSMENT/PLAN Matthew Black is a 43 y.o. male with no significant past medical history found down x 2 days nonverbal with L hemiparesis.   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 Trace L midline shift. ELVO at R ICA, R A1, R MCA.  MRI  Large R brain infarct sparing R PCA. Cytotoxic edema but no hemorrhage. Stable trace midline shift.  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 R ICA terminus, R ACA, R MCA  occluded.   CT head similar w/ low density and sweddling R ACA and MCA territories, now with 5-56mm midline shift. no hemorrhage  CT head acute R ACA and MCA infarct w/ progressive swelling decompressed through craniectomy defect.   CTA head and neck stable MLS. R ICA occlusion in neck that continues into R ACA and MCA. Mild petechial hemorrhage at basal ganglia  CT Head 8/29 - Unchanged appearance of massive right MCA and ACA territory infarcts with parenchyma extending through decompressive craniectomy. Small focus of suspected hemorrhage adjacent to the right caudate head.  2D Echo EF 60-65%  LE venous doppler DVT in R peroneal, L popliteal, L posterior tibial and L peroneal  TCD bubble no PFO  May consider TEE later once stable   LDL 83  HgbA1c 5.4  UDS positive THC  Hypercoagulable and autoimmune work up negative  Heparin IV for VTE prophylaxis  No antithrombotic prior to admission, now on heparin IV.   Therapy recommendations:  CIR  Disposition:  pending  Cyctotoxic cerebral edema  S/p R decompressive hemicraniectomy (Nundkumar) w/ flap R abd 07/19/2019  PICC placed   Given One dose of 23.4% 8/26  3% off 8/30 1700  Na 146 -> 140  Monitor Na daily  CT repeat pending  Right ICA occlusion  MRA and CTA showed right ICA occlusion from origin to terminal  Wife denies any head trauma  Wife stated that pt had recent aggressive exercise with weight lifting  Concerning dissection as working diagnosis of stroke etiology   B LE DVT  LE venous doppler DVT in R peroneal, L popliteal, L posterior tibial and L peroneal  Etiology unclear  Started iv heparin 07/26/2019  IVC filter placed 8/27  Hypercoagulable work up negative   Off SCDs  On IV Heparin per pharmacy dosing  Acute Respiratory Failure with inadequate airway protection  Intubated for airway protection  Now extubated  CXR on pneumonia or infiltration  Intermittent  coughing  Seizure-like activity  Continue Keppra   EEG continuous slowing, excessive beta activity, sleep spindle asymmetry decreased R->related to stroke and sedation. No SZ  Long Term EEG cortical dysfunction in right frontal region  Seizure precautions  Fever with Leukocytosis   Tmax - 101.8->99.7->101.8  WBC - 11.8->14.5->16.1  Vomited PTA, could be due to aspiration  CXR - 07/30/19 - Low volume study without focal infiltrate.   U/A and blood cultures - pending  Hyperlipidemia  Home meds:  no statin  LDL 83, goal < 70  Added lipitor 20 mg daily   D/c statin due to elevated LFT  Dysphagia . Secondary to stroke . NPO . Cortrak w/ TF @ 55cc . On free water 200cc q6  Other Stroke Risk Factors  Former Cigarette smoker, quit 3 yrs ago  ETOH use  Substance abuse UDS - positive for THC  Obesity, Body mass index is 30.66 kg/m., recommend weight loss, diet and exercise as appropriate   Other Active Problems  Elevated LFTs -  AST - 132->219  ; ALT - 68->244  - recheck in AM - and consult pharmacy  Hypokalemia - 3.5->3.4 - supplement ->3.5- 3.2->3.9  Hospital day # 11  I spent  35 minutes in total face-to-face time with the patient, more than 50% of which was spent in counseling and coordination of care, reviewing test results, images and medication, and discussing the diagnosis of continued fever, lethargy, elevated LFT, large right MCA infarct cerebral edema LE DVT, treatment plan and potential prognosis. This patient's care requiresreview of multiple databases, neurological assessment, and medical decision making of high complexity.  Rosalin Hawking, MD PhD Stroke Neurology 07/30/2019 7:41 PM   To contact Stroke Continuity provider, please refer to [email or web address] After hours, contact General Neurology

## 2019-07-30 NOTE — Progress Notes (Signed)
ANTICOAGULATION CONSULT NOTE  Pharmacy Consult:  Heparin Indication: DVT  Allergies  Allergen Reactions  . Hydrocodone Nausea Only    Patient Measurements: Height: 5\' 11"  (180.3 cm) Weight: 219 lb 12.8 oz (99.7 kg) IBW/kg (Calculated) : 75.3 Heparin Dosing Weight: 96 kg  Vital Signs: Temp: 99.7 F (37.6 C) (09/05 0359) Temp Source: Oral (09/05 0359) BP: 159/99 (09/05 0359) Pulse Rate: 103 (09/05 0359)  Labs: Recent Labs    07/28/19 0516 07/29/19 0457 07/30/19 0433 07/30/19 0434  HGB 12.5* 12.2*  --  12.0*  HCT 37.4* 36.9*  --  36.5*  PLT 219 239  --  236  HEPARINUNFRC 0.46 0.57 <0.10*  --   CREATININE 0.99  --   --  0.95    Estimated Creatinine Clearance: 123.2 mL/min (by C-G formula based on SCr of 0.95 mg/dL).    Assessment: Patient had an acute ischemic stroke and he did not receive tPA. Incidental finding of DVT and IVC filter was placed because patient was not a candidate for anticoagulation due to craniectomy. Neurosurgery cleared patient to initiate IV heparin with plan to retrieve IVC filter eventually.  Heparin level this am <0.1 units/ml  Goal of Therapy:  Heparin level 0.3-0.5 units/mL  Monitor platelets by anticoagulation protocol: Yes   Plan:  Increase heparin gtt to 1450 units/hr Check heparin level 6 hours after rate change Daily heparin level and CBC Follow up plan for oral anticoagulation  Thanks for allowing pharmacy to be a part of this patient's care.  Excell Seltzer, PharmD Clinical Pharmacist 07/30/2019, 6:29 AM

## 2019-07-30 NOTE — Progress Notes (Signed)
   Vital Signs MEWS/VS Documentation      07/29/2019 2337 07/30/2019 0359 07/30/2019 0700 07/30/2019 1500   MEWS Score:  2  1  1  4    MEWS Score Color:  Yellow  Green  Green  Red   Resp:  18  18  18  18    Pulse:  (!) 113  (!) 103  (!) 101  (!) 112   BP:  140/82  (!) 159/99  (!) 124/105  (!) 134/94   Temp:  97.9 F (36.6 C)  99.7 F (37.6 C)  97.6 F (36.4 C)  (!) 101.8 F (38.8 C)   O2 Device:  Room Air  Room Health visitor  Room Air   Level of Consciousness:  Alert  Alert  -  -       I had discussed with Dr Erlinda Hong regarding fever, B/C chest x-ray and UA ordered. I have medicated with tylenol for temp of 101.8. will recheck in an hour.     Jahkai Yandell, Jolene Schimke 07/30/2019,5:07 PM

## 2019-07-30 NOTE — Progress Notes (Signed)
Reviewed CT head shows new hemorrhage in the right frontal lobe.  We will go ahead and stop heparin drip.

## 2019-07-30 NOTE — Progress Notes (Signed)
Dr Erlinda Hong called me with instructions to remove helmet as is not needed for inpatient status. Same done

## 2019-07-31 LAB — CBC
HCT: 42.4 % (ref 39.0–52.0)
Hemoglobin: 13.8 g/dL (ref 13.0–17.0)
MCH: 28.3 pg (ref 26.0–34.0)
MCHC: 32.5 g/dL (ref 30.0–36.0)
MCV: 87.1 fL (ref 80.0–100.0)
Platelets: UNDETERMINED 10*3/uL (ref 150–400)
RBC: 4.87 MIL/uL (ref 4.22–5.81)
RDW: 14.3 % (ref 11.5–15.5)
WBC: 15.2 10*3/uL — ABNORMAL HIGH (ref 4.0–10.5)
nRBC: 0 % (ref 0.0–0.2)

## 2019-07-31 LAB — GLUCOSE, CAPILLARY
Glucose-Capillary: 124 mg/dL — ABNORMAL HIGH (ref 70–99)
Glucose-Capillary: 130 mg/dL — ABNORMAL HIGH (ref 70–99)
Glucose-Capillary: 105 mg/dL — ABNORMAL HIGH (ref 70–99)
Glucose-Capillary: 120 mg/dL — ABNORMAL HIGH (ref 70–99)
Glucose-Capillary: 102 mg/dL — ABNORMAL HIGH (ref 70–99)

## 2019-07-31 LAB — BASIC METABOLIC PANEL
Anion gap: 11 (ref 5–15)
BUN: 22 mg/dL — ABNORMAL HIGH (ref 6–20)
CO2: 25 mmol/L (ref 22–32)
Calcium: 9.3 mg/dL (ref 8.9–10.3)
Chloride: 103 mmol/L (ref 98–111)
Creatinine, Ser: 0.95 mg/dL (ref 0.61–1.24)
GFR calc Af Amer: >60 mL/min (ref >60)
GFR calc non Af Amer: >60 mL/min (ref >60)
Glucose, Bld: 126 mg/dL — ABNORMAL HIGH (ref 70–99)
Potassium: 4.4 mmol/L (ref 3.5–5.1)
Sodium: 139 mmol/L (ref 135–145)

## 2019-07-31 LAB — HEPATIC FUNCTION PANEL
ALT: 174 U/L — ABNORMAL HIGH (ref 0–44)
AST: 97 U/L — ABNORMAL HIGH (ref 15–41)
Albumin: 3.4 g/dL — ABNORMAL LOW (ref 3.5–5.0)
Alkaline Phosphatase: 95 U/L (ref 38–126)
Bilirubin, Direct: 0.3 mg/dL — ABNORMAL HIGH (ref 0.0–0.2)
Indirect Bilirubin: 0.4 mg/dL (ref 0.3–0.9)
Total Bilirubin: 0.7 mg/dL (ref 0.3–1.2)
Total Protein: 8 g/dL (ref 6.5–8.1)

## 2019-07-31 LAB — HEPARIN LEVEL (UNFRACTIONATED): Heparin Unfractionated: <0.1 IU/mL — ABNORMAL LOW (ref 0.30–0.70)

## 2019-07-31 MED ORDER — SODIUM CHLORIDE 0.9 % IV SOLN
3.0000 g | INTRAVENOUS | Status: AC
  Administered 2019-07-31: 15:00:00 3 g via INTRAVENOUS
  Filled 2019-07-31: qty 8

## 2019-07-31 MED ORDER — SODIUM CHLORIDE 0.9 % IV SOLN
3.0000 g | Freq: Four times a day (QID) | INTRAVENOUS | Status: DC
  Administered 2019-07-31 – 2019-08-04 (×16): 3 g via INTRAVENOUS
  Filled 2019-07-31 (×5): qty 8
  Filled 2019-07-31: qty 3
  Filled 2019-07-31 (×3): qty 8
  Filled 2019-07-31: qty 3
  Filled 2019-07-31 (×4): qty 8
  Filled 2019-07-31: qty 3
  Filled 2019-07-31 (×2): qty 8
  Filled 2019-07-31: qty 3
  Filled 2019-07-31 (×2): qty 8

## 2019-07-31 MED ORDER — HEPARIN SODIUM (PORCINE) 5000 UNIT/ML IJ SOLN
5000.0000 [IU] | Freq: Three times a day (TID) | INTRAMUSCULAR | Status: DC
  Administered 2019-07-31 – 2019-08-02 (×7): 5000 [IU] via SUBCUTANEOUS
  Filled 2019-07-31 (×7): qty 1

## 2019-07-31 NOTE — Progress Notes (Addendum)
STROKE TEAM PROGRESS NOTE   INTERVAL HISTORY Patient eyes open, awake, not in distress. Helmet off to further release pressure. CT repeat showed right hemorrhagic conversion with hematoma, heparin IV discontinued. Overnight low grade fever, tachycardia but otherwise stable.    Vitals:   07/30/19 2346 07/31/19 0133 07/31/19 0408 07/31/19 0418  BP: (!) 161/85  (!) 159/95   Pulse: (!) 106  (!) 111 (!) 106  Resp: 20  20   Temp:   100.2 F (37.9 C)   TempSrc:   Oral   SpO2: 96%  100%   Weight:  99.8 kg    Height:        CBC:  Recent Labs  Lab 07/29/19 0457 07/30/19 0434  WBC 14.5* 16.1*  NEUTROABS  --  11.5*  HGB 12.2* 12.0*  HCT 36.9* 36.5*  MCV 86.4 86.1  PLT 239 [id number]    Basic Metabolic Panel:  Recent Labs  Lab 07/28/19 0516 07/30/19 0434  NA 141 140  K 3.4* 3.9  CL 104 106  CO2 24 24  GLUCOSE 125* 128*  BUN 30* 22*  CREATININE 0.99 0.95  CALCIUM 8.8* 9.2    IMAGING Ct Head Wo Contrast Ct Cervical Spine Wo Contrast  07/19/2019 1132 1. 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 2. No associated hemorrhage and mild intracranial mass effect at this time, including trace leftward midline shift. 3. Evidence of large vessel occlusion: Hyperdensity of the right ICA terminus, the right A1 and MCA. 4. Unaffected brain parenchyma appears negative. 5.  No acute traumatic injury identified in the cervical spine.   Mr Brain 46 Contrast Mr Angio Head Wo Contrast Mr Angio Neck Wo Contrast 07/19/2019 1330 1. 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 The right ICA terminus, right ACA and MCA also appear occluded. 2. 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 Cytotoxic edema but no convincing hemorrhage. Stable mild mass effect with trace leftward midline shift. 3. Intracranial MRA is degraded by motion artifact, but no other large vessel occlusion is suspected. There appears to be lobes reperfusion  of the right PCA. 4. The left hemisphere and posterior fossa brain parenchyma appears normal.   Ct Head Wo Contrast 07/19/2019 1532 Similar appearance to earlier. Low-density and swelling of the right hemisphere in the ACA and MCA territories with mass effect and right-to-left shift of 5-6 mm. No hemorrhagic transformation at this time.   Ct Head Wo Contrast 07/20/2019 0444 1. Acute right ACA and MCA territory infarct with progressive swelling that has decompressed through the craniectomy defect. 2. No acute hemorrhage or new infarction.   Ct Angio Head W Or Wo Contrast Ct Angio Neck W Or Wo Contrast 07/21/2019 1. Stable from prior MRA. 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 No evidence of atherosclerosis or vasculopathy in the other vessels. 2. Cytotoxic edema causes 5 mm of midline shift and brain bulging through the craniectomy defect. Mild petechial hemorrhage is seen at the basal ganglia.   CT Head WO Contrast 07/23/2019 1. Unchanged appearance of massive right MCA and ACA territory infarcts with parenchyma extending through decompressive craniectomy. 2. Small focus of suspected hemorrhage adjacent to the right caudate head.  CT Head WO Contrast  07/30/2019 IMPRESSION: 1. Decreasing mass effect within large right anterior MCA and ACA territory infarct. 2. Midline shift is no longer present. There is decreased effacement of the right  lateral ventricle. 3. Infarcted brain tissue still herniates through the craniectomy site. 4. New parenchymal hemorrhage within the infarcted tissue anteriorly measures 2.5 x 2.3 x 2.7 cm. 5. No new infarct.  Chest 1 View Portable  07/23/2019 Worsened atelectasis in the right lower lobe. Right arm PICC tip in the lower right atrium.  2D Echocardiogram  1. The left ventricle has normal systolic function with an ejection fraction of 60-65%. The cavity size was normal. Left ventricular diastolic parameters  were normal.  2. The right ventricle has normal systolic function. The cavity was normal. There is no increase in right ventricular wall thickness.  3. The pericardial effusion is circumferential.  4. Trivial pericardial effusion is present.  5. The mitral valve is grossly normal.  6. The tricuspid valve is grossly normal.  7. The aortic valve is tricuspid. No stenosis of the aortic valve.  8. The aorta is normal unless otherwise noted.  9. The aortic root is normal in size and structure. 10. No cardiac source of embolism identified. 11. When compared to the prior study: No comparison.  LE Dopplers Right: Findings consistent with acute deep vein thrombosis involving the right peroneal veins. Left: Findings consistent with acute deep vein thrombosis involving the left popliteal vein, left posterior tibial veins, and left peroneal veins.  Vas [place] Transcranial Doppler W Bubbles 07/25/2019 No HITS heard heard at rest. No HITS heard heard during valsalva.   PHYSICAL EXAM   General - Well nourished, well developed, middle-aged African-American male.  He has right hemicraniectomy surgical incision on the scalp.  Ophthalmologic - fundi not visualized due to noncooperation.  Cardiovascular - Regular rate and rhythm.  Neuro - patient is drowsy, eyes close, but open on voice and pain. Nonverbal, following limited commands on the right. Eyes in right gaze position, not blinking to visual threat to the left, PERRL. Left facial droop.  Tongue protrusion not corporative.  Has spontaneous movement of RUE and RLE, 3-4/5 at least. On pain stimulation, mild withdraw of RLE, but no movement of LUE. DTR 1+ and no babinski. Sensation, coordination and gait not tested.   ASSESSMENT/PLAN [name] is a 43 y.o. male with no significant past medical history found down x 2 days nonverbal with L hemiparesis.   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 Trace L midline shift. ELVO at R ICA, R A1, R MCA.  MRI  Large R brain infarct sparing R PCA. Cytotoxic edema but no hemorrhage. Stable trace midline shift.  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 R ICA terminus, R ACA, R MCA occluded.   CT head similar w/ low density and sweddling R ACA and MCA territories, now with 5-73mm midline shift. no hemorrhage  CT head acute R ACA and MCA infarct w/ progressive swelling decompressed through craniectomy defect.   CTA head and neck stable MLS. R ICA occlusion in neck that continues into R ACA and MCA. Mild petechial hemorrhage at basal ganglia  CT Head 8/29 - Unchanged appearance of massive right MCA and ACA territory infarcts with parenchyma extending through decompressive craniectomy. Small focus of suspected hemorrhage adjacent to the right caudate head.  CT Head WO - 07/30/19 - New hematoma within the infarcted tissue   2D Echo EF 60-65%  LE venous doppler DVT in R peroneal, L popliteal, L posterior tibial and L peroneal  TCD bubble no PFO  May consider TEE later once stable   LDL 83  HgbA1c 5.4  UDS positive THC  Hypercoagulable and autoimmune work up negative  Heparin IV for VTE prophylaxis  No antithrombotic prior to admission, was on heparin IV but now discontinued due to hemorrhagic conversion with hematoma  Therapy recommendations:  CIR  Disposition:  pending   Cyctotoxic cerebral edema  S/p R decompressive hemicraniectomy (Nundkumar) w/ flap R abd 07/19/2019  PICC placed   Given One dose of 23.4% 8/26  3% off 8/30 1700  Na 146 -> 140  Monitor Na daily  CT repeat 07/30/19 - New parenchymal hemorrhage within the infarcted tissue  Off helmet to further release pressure  CT repeat in am  Right ICA occlusion  MRA and CTA showed right ICA  occlusion from origin to terminal  Wife denies any head trauma  Wife stated that pt had recent aggressive exercise with weight lifting  Concerning dissection as working diagnosis of stroke etiology   B LE DVT  LE venous doppler DVT in R peroneal, L popliteal, L posterior tibial and L peroneal  Etiology unclear  Started iv heparin 07/26/2019  IVC filter placed 8/27  Hypercoagulable work up negative   Off SCDs  Was on IV Heparin but now off secondary to new hemorrhage  Acute Respiratory Failure with inadequate airway protection  Intubated for airway protection  Now extubated  CXR on pneumonia or infiltration  Intermittent coughing  Seizure-like activity  Continue Keppra   EEG continuous slowing, excessive beta activity, sleep spindle asymmetry decreased R->related to stroke and sedation. No SZ  Long Term EEG cortical dysfunction in right frontal region  Seizure precautions  Fever with Leukocytosis   Tmax - 101.8->99.7->101.8->101.5  WBC - 11.8->14.5->16.1  Tachycardia due to fever  Vomited PTA, could be due to aspiration  CXR - 07/30/19 - Low volume study without focal infiltrate.   Intermittent coughing  U/A x 2 - negative  Blood cultures - NGTD  Will empiric treat with unasyn  Hyperlipidemia  Home meds:  no statin  LDL 83, goal < 70  Added lipitor 20 mg daily   D/c statin due to elevated LFT  Dysphagia . Secondary to stroke . NPO . Cortrak w/ TF @ 55cc . On free water 200cc q6  Other Stroke Risk Factors  Former Cigarette smoker, quit 3 yrs ago  ETOH use  Substance abuse UDS - positive for THC  Obesity, Body mass index is 30.69 kg/m., recommend weight loss, diet and exercise as appropriate   Other Active Problems  Elevated LFTs -  AST - 132->219->97  ; ALT - 68->244->174  - improving   Hypokalemia - 3.5->3.4 - supplement ->3.5- 3.2->3.9  Hospital day # 12  I spent  35 minutes in total face-to-face time with the patient,  more than 50% of which was spent in counseling and coordination of care, reviewing test results, images and medication, and discussing the diagnosis of continued fever, lethargy, elevated LFT, large right MCA infarct cerebral edema LE DVT, treatment plan and potential prognosis. This patient's care requiresreview of multiple databases, neurological assessment, and medical decision making of high complexity.  I had long discussion with wife Matthew Black, updated pt current condition, treatment plan and potential prognosis, and answered all her questions. She expressed understanding and appreciation.    Matthew Hawking, MD PhD Stroke Neurology 07/31/2019 1:05 PM  To contact Stroke Continuity provider, please refer to [email or web address] After hours, contact General Neurology

## 2019-07-31 NOTE — Progress Notes (Signed)
Pharmacy Antibiotic Note  Matthew Black is a 43 y.o. male admitted on 07/19/2019 with acute CVA and now with concern for aspiration PNA. Marland Kitchen  Pharmacy has been consulted for Unasyn dosing.  Plan: - Start Unasyn 3g IV every 6 hours - Will continue to follow renal function, culture results, LOT, and antibiotic de-escalation plans   Height: 5\' 11"  (180.3 cm) Weight: 220 lb 0.3 oz (99.8 kg) IBW/kg (Calculated) : 75.3  Temp (24hrs), Avg:100.3 F (37.9 C), Min:98.8 F (37.1 C), Max:101.8 F (38.8 C)  Recent Labs  Lab 07/26/19 0600 07/27/19 0500 07/28/19 0516 07/29/19 0457 07/30/19 0434 07/31/19 0715  WBC 11.8* 13.6* 13.5* 14.5* 16.1* 15.2*  CREATININE 0.86 0.87 0.99  --  0.95 0.95    Estimated Creatinine Clearance: 123.2 mL/min (by C-G formula based on SCr of 0.95 mg/dL).    Allergies  Allergen Reactions  . Hydrocodone Nausea Only    Antimicrobials this admission: Unasyn 9/6 >>  Microbiology results: 8/25 BCx - negative 8/25 UCx - negative 8/25 covid/MRSA PCR - negative 9/5 BCx >>  Thank you for allowing pharmacy to be a part of this patient's care.  Alycia Rossetti, PharmD, BCPS Clinical Pharmacist Clinical phone for 07/31/2019: [phone] 07/31/2019 1:58 PM   **Pharmacist phone directory can now be found on [email or web address] ([email or web address]).  Listed under [place].

## 2019-08-01 ENCOUNTER — Inpatient Hospital Stay (HOSPITAL_COMMUNITY): Payer: Medicaid Other

## 2019-08-01 LAB — GLUCOSE, CAPILLARY
Glucose-Capillary: 106 mg/dL — ABNORMAL HIGH (ref 70–99)
Glucose-Capillary: 110 mg/dL — ABNORMAL HIGH (ref 70–99)
Glucose-Capillary: 113 mg/dL — ABNORMAL HIGH (ref 70–99)
Glucose-Capillary: 125 mg/dL — ABNORMAL HIGH (ref 70–99)
Glucose-Capillary: 126 mg/dL — ABNORMAL HIGH (ref 70–99)
Glucose-Capillary: 135 mg/dL — ABNORMAL HIGH (ref 70–99)
Glucose-Capillary: 137 mg/dL — ABNORMAL HIGH (ref 70–99)

## 2019-08-01 LAB — URINALYSIS, ROUTINE W REFLEX MICROSCOPIC
Bilirubin Urine: NEGATIVE
Glucose, UA: NEGATIVE mg/dL
Hgb urine dipstick: NEGATIVE
Ketones, ur: NEGATIVE mg/dL
Leukocytes,Ua: NEGATIVE
Nitrite: NEGATIVE
Protein, ur: NEGATIVE mg/dL
Specific Gravity, Urine: 1.033 — ABNORMAL HIGH (ref 1.005–1.030)
pH: 5 (ref 5.0–8.0)

## 2019-08-01 LAB — BASIC METABOLIC PANEL
Anion gap: 11 (ref 5–15)
BUN: 23 mg/dL — ABNORMAL HIGH (ref 6–20)
CO2: 25 mmol/L (ref 22–32)
Calcium: 8.8 mg/dL — ABNORMAL LOW (ref 8.9–10.3)
Chloride: 104 mmol/L (ref 98–111)
Creatinine, Ser: 1.14 mg/dL (ref 0.61–1.24)
GFR calc Af Amer: >60 mL/min (ref >60)
GFR calc non Af Amer: >60 mL/min (ref >60)
Glucose, Bld: 136 mg/dL — ABNORMAL HIGH (ref 70–99)
Potassium: 4.3 mmol/L (ref 3.5–5.1)
Sodium: 140 mmol/L (ref 135–145)

## 2019-08-01 LAB — TROPONIN I (HIGH SENSITIVITY)
Troponin I (High Sensitivity): 8 ng/L (ref <18)
Troponin I (High Sensitivity): 10 ng/L (ref <18)

## 2019-08-01 LAB — CBC
HCT: 37.5 % — ABNORMAL LOW (ref 39.0–52.0)
Hemoglobin: 12.2 g/dL — ABNORMAL LOW (ref 13.0–17.0)
MCH: 28.4 pg (ref 26.0–34.0)
MCHC: 32.5 g/dL (ref 30.0–36.0)
MCV: 87.4 fL (ref 80.0–100.0)
Platelets: 214 10*3/uL (ref 150–400)
RBC: 4.29 MIL/uL (ref 4.22–5.81)
RDW: 14.1 % (ref 11.5–15.5)
WBC: 13.5 10*3/uL — ABNORMAL HIGH (ref 4.0–10.5)
nRBC: 0 % (ref 0.0–0.2)

## 2019-08-01 LAB — LACTIC ACID, PLASMA
Lactic Acid, Venous: 1.2 mmol/L (ref 0.5–1.9)
Lactic Acid, Venous: 2 mmol/L (ref 0.5–1.9)

## 2019-08-01 MED ORDER — SODIUM CHLORIDE 0.9 % IV BOLUS
500.0000 mL | Freq: Once | INTRAVENOUS | Status: AC
  Administered 2019-08-01: 500 mL via INTRAVENOUS

## 2019-08-01 MED ORDER — SODIUM CHLORIDE 0.9 % IV BOLUS
1000.0000 mL | Freq: Once | INTRAVENOUS | Status: AC
  Administered 2019-08-01: 1000 mL via INTRAVENOUS

## 2019-08-01 MED ORDER — FREE WATER
200.0000 mL | Status: DC
  Administered 2019-08-01 – 2019-08-03 (×11): 200 mL

## 2019-08-01 MED ORDER — METOPROLOL TARTRATE 50 MG PO TABS
50.0000 mg | ORAL_TABLET | Freq: Two times a day (BID) | ORAL | Status: DC
  Administered 2019-08-01 – 2019-08-03 (×4): 50 mg via ORAL
  Filled 2019-08-01 (×4): qty 1

## 2019-08-01 MED ORDER — SODIUM CHLORIDE 0.9 % IV SOLN
INTRAVENOUS | Status: DC
  Administered 2019-08-02 – 2019-08-14 (×13): via INTRAVENOUS

## 2019-08-01 MED ORDER — METOPROLOL TARTRATE 25 MG PO TABS
25.0000 mg | ORAL_TABLET | Freq: Two times a day (BID) | ORAL | Status: DC
  Administered 2019-08-01: 25 mg via ORAL
  Filled 2019-08-01: qty 1

## 2019-08-01 MED ORDER — IOHEXOL 350 MG/ML SOLN
75.0000 mL | Freq: Once | INTRAVENOUS | Status: AC | PRN
  Administered 2019-08-01: 21:00:00 75 mL via INTRAVENOUS

## 2019-08-01 NOTE — Progress Notes (Signed)
Radiology called for a non occlusive left posterior lower lobe thrombus seen on CTA. Neurology notified.

## 2019-08-01 NOTE — Progress Notes (Signed)
08/01/19 1456  PT Visit Information  Last PT Received On 08/01/19  Assistance Needed +2  PT/OT/SLP Co-Evaluation/Treatment Yes  Reason for Co-Treatment Complexity of the patient's impairments (multi-system involvement);Necessary to address cognition/behavior during functional activity;To address functional/ADL transfers  PT goals addressed during session Mobility/safety with mobility;Balance  History of Present Illness Pt is a 43 y.o. M with no known PMH who presents on 8/25 after being found down at work, nonverbal with L hemiparesis. CT head showing large R hemisphere infarct with edema and mild mass effect. 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 Stable trace midline shift. S/p hemicraniectomy with abdominal flap implant. BLE DVT vasc US 8/28: acute DVT bilateral lower extremities, s/p IVC placement.   Subjective Data  Patient Stated Goal unable  Precautions  Precautions Fall  Precaution Comments L hemiparesis  Required Braces or Orthoses Other Brace  Other Brace helmet   Restrictions  Weight Bearing Restrictions No  Pain Assessment  Pain Assessment Faces  Faces Pain Scale 0  Cognition  Arousal/Alertness Lethargic  Behavior During Therapy Flat affect  Overall Cognitive Status Impaired/Different from baseline  Area of Impairment Attention;Awareness;Following commands;Safety/judgement;Problem solving  Current Attention Level Focused  Following Commands Follows one step commands with increased time;Follows one step commands inconsistently  Safety/Judgement Decreased awareness of safety;Decreased awareness of deficits  Awareness Intellectual  Problem Solving Requires tactile cues;Requires verbal cues  General Comments Pt keeping eyes closed throughout most of session. Was able to follow some simple commands using R extremities with increased time. Pt did not follow cues to open eyes throughout session.   Bed Mobility  Overal bed mobility  Needs Assistance  Bed Mobility Rolling;Sidelying to Sit;Sit to Supine  Rolling Total assist;+2 for physical assistance  Sidelying to sit Total assist;+2 for physical assistance  Sit to supine Total assist;+2 for physical assistance  General bed mobility comments Pt was total A +2 for all of bed mobility. Pt reached with RUE to bed rail for log rolling with max HOHA.   Transfers  General transfer comment Unable   Modified Rankin (Stroke Patients Only)  Pre-Morbid Rankin Score 0  Modified Rankin 5  Balance  Overall balance assessment Needs assistance  Sitting-balance support Feet supported;Single extremity supported  Sitting balance-Leahy Scale Zero  Sitting balance - Comments poor head control and max A required to maintain sitting balance EOB; approximation through L UE   General Comments  General comments (skin integrity, edema, etc.) Pt HR ranging from mid 120s to mid 130s during session.   Exercises  Exercises Other exercises  Other Exercises  Other Exercises passive movement of left UE and LE, unable to follow direction. Flaccid Left side. Passive BUE scapular retraction and trunk extension.  Other Exercises Practiced RLE LAQ. Required max multimodal cues and increased time to complete.   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 1  Help needed moving from lying on your back to sitting on the side of a flat bed without using bedrails? 1  Help needed moving to and from a bed to a chair (including a wheelchair)? 1  Help needed standing up from a chair using your arms (e.g., wheelchair or bedside chair)? 1  Help needed to walk in hospital room? 1  Help needed climbing 3-5 steps with a railing?  1  6 Click Score 6  Consider Recommendation of Discharge To: CIR/SNF/LTACH  PT Goal Progression  Progress towards PT goals Progressing toward goals (slowly)  Acute Rehab PT Goals  PT Goal Formulation Patient unable to participate in goal setting  Time For Goal Achievement 08/07/19  Potential to Achieve Goals Fair  PT Time Calculation  PT Start Time (ACUTE ONLY) 1103  PT Stop Time (ACUTE ONLY) 1129  PT Time Calculation (min) (ACUTE ONLY) 26 min  PT General Charges  $$ ACUTE PT VISIT 1 Visit  PT Treatments  $Therapeutic Activity 8-22 mins   Pt with slow progression towards goals. Required max to total A +2 for bed mobility and sitting balance this session. Was able to follow some one step commands with increased time, however, was inconsistent. Current recommendations appropriate. Will continue to follow acutely to maximize functional mobility independence and safety.   Leighton Ruff, PT, DPT  Acute Rehabilitation Services  Pager: [phone] Office: [phone]

## 2019-08-01 NOTE — Progress Notes (Addendum)
Occupational Therapy Treatment Patient Details Name: Matthew Black MRN: [id number] DOB: 05-29-1976 Today's Date: 08/01/2019    History of present illness Pt is a 43 y.o. M with no known PMH who presents on 8/25 after being found down at work, nonverbal with L hemiparesis. CT head showing large R hemisphere infarct with edema and mild mass effect. 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 Stable trace midline shift. S/p hemicraniectomy with abdominal flap implant. BLE DVT vasc US 8/28: acute DVT bilateral lower extremities, s/p IVC placement.    OT comments  Pt was supine in bed with eyes closed upon arrival. Pt continues to present with decreased balance, arousal, functional use of LUE, and left inattention/neglect. Pt required total A +2 for bed mobility and sitting EOB. Pt able to follow simple commands inconsistently and with increased time. Update dc recommendation to SNF due to decreased arousal and occupational participation. Will continue to follow acutely as admitted.    Follow Up Recommendations  SNF    Equipment Recommendations  Other (comment)(to be determined)    Recommendations for Other Services Rehab consult(pending process but start following)    Precautions / Restrictions Precautions Precautions: Fall Precaution Comments: L hemiparesis Required Braces or Orthoses: Other Brace Other Brace: helmet  Restrictions Weight Bearing Restrictions: No       Mobility Bed Mobility Overal bed mobility: Needs Assistance Bed Mobility: Rolling;Sit to Supine;Sidelying to Sit Rolling: Total assist Sidelying to sit: Total assist   Sit to supine: Total assist   General bed mobility comments: Pt was total A +2 for all of bed mobility. Pt reached with RUE to bed rail for log rolling with max HOHA.   Transfers                 General transfer comment: unable    Balance Overall balance assessment: Needs assistance Sitting-balance  support: Feet supported;Single extremity supported Sitting balance-Leahy Scale: Zero Sitting balance - Comments: poor head control and max A required to maintain sitting balance EOB; approximation through L UE                                    ADL either performed or assessed with clinical judgement   ADL Overall ADL's : Needs assistance/impaired     Grooming: Wash/dry face;Total assistance;Sitting Grooming Details (indicate cue type and reason): Required Total A for sitting balance, holding up his head, max HOHA to bring wash cloth to his face. Poor attention and fatigues quickly.                             Functional mobility during ADLs: Total assistance;+2 for physical assistance;+2 for safety/equipment General ADL Comments: Pt continues to require Total A for ADLs.      Vision   Additional Comments: Keeping eyes closed for 90% of session.   Perception     Praxis      Cognition Arousal/Alertness: Lethargic Behavior During Therapy: Flat affect Overall Cognitive Status: Impaired/Different from baseline Area of Impairment: Attention;Awareness;Following commands;Safety/judgement                   Current Attention Level: Focused   Following Commands: Follows multi-step commands inconsistently Safety/Judgement: Decreased awareness of safety;Decreased awareness of deficits Awareness: Intellectual Problem Solving: Requires tactile cues;Requires verbal cues General Comments: Keeping eyes closed throughout session, followed  some motor commands on right side.        Exercises Other Exercises Other Exercises: passive movement of left UE and LE, unable to follow direction. Flaccid Left side. Passive BUE scapular retraction and trunk extension.   Shoulder Instructions       General Comments Pt HR went up to 130s while sitting EOB.    Pertinent Vitals/ Pain       Pain Assessment: Faces Pain Location: unable  Home Living Family/patient  expects to be discharged to:: Unsure Living Arrangements: Spouse/significant other Available Help at Discharge: Family                             Additional Comments: list wife in chart   Lives With: Spouse;Family(2 kids)    Prior Functioning/Environment Level of Independence: Independent        Comments: working   Frequency  Min 2X/week        Progress Toward Goals  OT Goals(current goals can now be found in the care plan section)     Acute Rehab OT Goals Patient Stated Goal: unable OT Goal Formulation: Patient unable to participate in goal setting Time For Goal Achievement: 08/07/19 Potential to Achieve Goals: Fair  Plan      Co-evaluation    PT/OT/SLP Co-Evaluation/Treatment: Yes Reason for Co-Treatment: Complexity of the patient's impairments (multi-system involvement) PT goals addressed during session: Mobility/safety with mobility OT goals addressed during session: ADL's and self-care      AM-PAC OT "6 Clicks" Daily Activity     Outcome Measure   Help from another person eating meals?: Total Help from another person taking care of personal grooming?: Total Help from another person toileting, which includes using toliet, bedpan, or urinal?: Total Help from another person bathing (including washing, rinsing, drying)?: Total Help from another person to put on and taking off regular upper body clothing?: Total Help from another person to put on and taking off regular lower body clothing?: Total 6 Click Score: 6    End of Session    OT Visit Diagnosis: Unsteadiness on feet (R26.81);Muscle weakness (generalized) (M62.81);Hemiplegia and hemiparesis Hemiplegia - Right/Left: Left Hemiplegia - dominant/non-dominant: Non-Dominant Hemiplegia - caused by: Nontraumatic intracerebral hemorrhage   Activity Tolerance Patient tolerated treatment well   Patient Left in bed;with call bell/phone within reach;with bed alarm set   Nurse Communication  Mobility status;Precautions        Time: [id number] OT Time Calculation (min): 26 min  Charges: OT General Charges $OT Visit: 1 Visit OT Treatments $Self Care/Home Management : 8-22 mins  Matthew Black, OT Student  Matthew Black 08/01/2019, 11:57 AM   Read, reviewed, edited and agree with student's findings and recommendations.   [place], OTR/L Acute Rehab Pager: [phone] Office: [phone]

## 2019-08-01 NOTE — Progress Notes (Addendum)
CTA of chest reveals a small nonocclusive thrombus:  There is nonocclusive thrombus seen in the posterior left lower lobe segmental artery, series 5, image 66. The heart is normal in size. No pericardial effusion thickening. No evidence right heart strain.   A/R: 43 year old male with large right MCA stroke, s/p right hemicraniectomy, with multiple DVTs, now with small nonocclusive PE.  1. PE. In the context of the patient's large right MCA stroke with focal hemorrhagic conversion, restarting IV heparin is contraindicated. The patient is currently on prophylactic sq heparin, which should be continued. Discussed case with CCM. Although the thrombus seen on CTA chest may propagate, risks of new hemorrhage or rebleeding of recent hemorrhage seen on CT head significantly outweigh benefits of heparin administration. An IVC filter was placed earlier this admission.  2. Tachycardia. Small PE not likely the etiology given no evidence of right heart strain on CTA chest. EKG has been ordered and shows sinus tachycardia with possible left atrial enlargement noted. Will also bolus with additional 1 L NS and start on 75 cc/hr NS infusion for 12 hours. He has free water flushes of 200 cc q4h, which may need to be increased by AM team after IV NS infusion is completed.   Electronically signed: Dr. Kerney Elbe

## 2019-08-01 NOTE — Progress Notes (Signed)
  Speech Language Pathology Treatment: Cognitive-Linquistic  Patient Details Name: Matthew Black MRN: [id number] DOB: 11/23/1976 Today's Date: 08/01/2019 Time: [id number] SLP Time Calculation (min) (ACUTE ONLY): 35 min  Assessment / Plan / Recommendation Clinical Impression  Oral care completed with oral suctioning administered post oral care d/t salivary pooling on left; no POs attempted this date; volitional swallow noted x1 after oral care and weak cough noted x2 during manipulation in bed when moved by staff d/t pt requiring change of clothing/bed sheets when SLP in room; functional directives utilized for 1-2 step commands during session with pt able to follow 1-step simple directives with max multimodal cues with 80% accuracy (ie: close eyes, lift arm, open mouth, etc.); 2-step with max verbal/visual/tactile cues with 20% accuracy; pt non-verbal with attempts to communicate via eye blinking, gesturing, head nod/shake for Yes/No unsuccessful; pt with flat affect and would often close eyes during session in response to directives; nursing stated pt more alert this date; ST will continue efforts for PO intake and linguistic/cognitive deficits while in acute setting.   HPI HPI: Pt is a 43 y.o. with no known PMH who presents on 8/25 after being found down at work, nonverbal with L hemiparesis. 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 Stable trace midline shift. S/p hemicraniectomy with abdominal flap implant. Intubated 8/26-8/31. CXR worsened atelectasis in the right lower lobe.      SLP Plan  Continue with current plan of care       Recommendations  Diet recommendations: NPO Medication Administration: Via alternative means                General recommendations: Rehab consult Oral Care Recommendations: Oral care QID Follow up Recommendations: 24 hour supervision/assistance;Inpatient Rehab SLP Visit Diagnosis: Dysphagia, unspecified  (R13.10);Cognitive communication deficit [id number]) Plan: Continue with current plan of care                       Elvina Sidle, M.S., [place] 08/01/2019, 12:21 PM

## 2019-08-01 NOTE — Progress Notes (Signed)
Inpatient Rehabilitation Admissions Coordinator  Therapy now recommending SNF for decline in function. I will follow his progress at a distance this week to assess if progress improves.  Danne Baxter, RN, MSN Rehab Admissions Coordinator [phone] 08/01/2019 4:14 PM

## 2019-08-02 ENCOUNTER — Inpatient Hospital Stay (HOSPITAL_COMMUNITY): Payer: Medicaid Other

## 2019-08-02 DIAGNOSIS — I2693 Single subsegmental pulmonary embolism without acute cor pulmonale: Secondary | ICD-10-CM

## 2019-08-02 DIAGNOSIS — M7989 Other specified soft tissue disorders: Secondary | ICD-10-CM

## 2019-08-02 DIAGNOSIS — I6389 Other cerebral infarction: Secondary | ICD-10-CM

## 2019-08-02 DIAGNOSIS — I82409 Acute embolism and thrombosis of unspecified deep veins of unspecified lower extremity: Secondary | ICD-10-CM

## 2019-08-02 LAB — CBC
HCT: 35.6 % — ABNORMAL LOW (ref 39.0–52.0)
Hemoglobin: 11.9 g/dL — ABNORMAL LOW (ref 13.0–17.0)
MCH: 28.7 pg (ref 26.0–34.0)
MCHC: 33.4 g/dL (ref 30.0–36.0)
MCV: 85.8 fL (ref 80.0–100.0)
Platelets: 198 10*3/uL (ref 150–400)
RBC: 4.15 MIL/uL — ABNORMAL LOW (ref 4.22–5.81)
RDW: 13.9 % (ref 11.5–15.5)
WBC: 17.5 10*3/uL — ABNORMAL HIGH (ref 4.0–10.5)
nRBC: 0 % (ref 0.0–0.2)

## 2019-08-02 LAB — HEPATIC FUNCTION PANEL
ALT: 116 U/L — ABNORMAL HIGH (ref 0–44)
AST: 63 U/L — ABNORMAL HIGH (ref 15–41)
Albumin: 3 g/dL — ABNORMAL LOW (ref 3.5–5.0)
Alkaline Phosphatase: 94 U/L (ref 38–126)
Bilirubin, Direct: 0.3 mg/dL — ABNORMAL HIGH (ref 0.0–0.2)
Indirect Bilirubin: 0.5 mg/dL (ref 0.3–0.9)
Total Bilirubin: 0.8 mg/dL (ref 0.3–1.2)
Total Protein: 7.2 g/dL (ref 6.5–8.1)

## 2019-08-02 LAB — BASIC METABOLIC PANEL
Anion gap: 8 (ref 5–15)
BUN: 24 mg/dL — ABNORMAL HIGH (ref 6–20)
CO2: 24 mmol/L (ref 22–32)
Calcium: 8.6 mg/dL — ABNORMAL LOW (ref 8.9–10.3)
Chloride: 105 mmol/L (ref 98–111)
Creatinine, Ser: 1.05 mg/dL (ref 0.61–1.24)
GFR calc Af Amer: >60 mL/min (ref >60)
GFR calc non Af Amer: >60 mL/min (ref >60)
Glucose, Bld: 142 mg/dL — ABNORMAL HIGH (ref 70–99)
Potassium: 4.5 mmol/L (ref 3.5–5.1)
Sodium: 137 mmol/L (ref 135–145)

## 2019-08-02 LAB — GLUCOSE, CAPILLARY
Glucose-Capillary: 116 mg/dL — ABNORMAL HIGH (ref 70–99)
Glucose-Capillary: 118 mg/dL — ABNORMAL HIGH (ref 70–99)
Glucose-Capillary: 120 mg/dL — ABNORMAL HIGH (ref 70–99)
Glucose-Capillary: 123 mg/dL — ABNORMAL HIGH (ref 70–99)
Glucose-Capillary: 129 mg/dL — ABNORMAL HIGH (ref 70–99)
Glucose-Capillary: 113 mg/dL — ABNORMAL HIGH (ref 70–99)
Glucose-Capillary: 116 mg/dL — ABNORMAL HIGH (ref 70–99)

## 2019-08-02 LAB — TROPONIN I (HIGH SENSITIVITY): Troponin I (High Sensitivity): 13 ng/L (ref <18)

## 2019-08-02 MED ORDER — HEPARIN (PORCINE) 25000 UT/250ML-% IV SOLN
1350.0000 [IU]/h | INTRAVENOUS | Status: DC
  Administered 2019-08-02: 21:00:00 1150 [IU]/h via INTRAVENOUS
  Administered 2019-08-03: 18:00:00 1250 [IU]/h via INTRAVENOUS
  Administered 2019-08-04 – 2019-08-06 (×4): 1350 [IU]/h via INTRAVENOUS
  Filled 2019-08-02 (×6): qty 250

## 2019-08-02 NOTE — Progress Notes (Signed)
STROKE TEAM PROGRESS NOTE   INTERVAL HISTORY Pt wife at bedside. Neuro stable. Pt still has tachycardia but improving. HR from 98-112 this morning. Had CT chest PE protocol showed non-occlusive PE. LE venous doppler today showed left LE extensive DVT progressive from prior exam and now thrombus all the way up to the IVC filter.   Wife had conversion with Dr. Cheral Marker last night about the PE management. I also talked with her during round. This is a treatment dilemma. On the one hand, he has hemorrhagic conversion with right frontal hematoma but on the other hand, he has significant LE DVT s/p IVC filter and now PE. Given her stable neuro exam and CT head repeat documented stability of hematoma, I am willing to restart heparin IV and close monitoring. However, wife has difficulty with decision given risks in either way. She is asking for second opinion and I will have Dr. Leonie Man to call her.   Vitals:   08/02/19 0401 08/02/19 0500 08/02/19 0700 08/02/19 1100  BP: (!) 150/70  (!) 166/73 123/81  Pulse: (!) 107  (!) 112 98  Resp: 18  17 17   Temp: 98.2 F (36.8 C)  98.2 F (36.8 C) 98.3 F (36.8 C)  TempSrc: Oral  Axillary Axillary  SpO2: 95%  100% 100%  Weight:  101.3 kg    Height:        CBC:  Recent Labs  Lab 07/30/19 0434  08/01/19 0450 08/02/19 0222  WBC 16.1*   < > 13.5* 17.5*  NEUTROABS 11.5*  --   --   --   HGB 12.0*   < > 12.2* 11.9*  HCT 36.5*   < > 37.5* 35.6*  MCV 86.1   < > 87.4 85.8  PLT 236   < > 214 198   < > = values in this interval not displayed.    Basic Metabolic Panel:  Recent Labs  Lab 08/01/19 0450 08/02/19 0222  NA 140 137  K 4.3 4.5  CL 104 105  CO2 25 24  GLUCOSE 136* 142*  BUN 23* 24*  CREATININE 1.14 1.05  CALCIUM 8.8* 8.6*    IMAGING Ct Head Wo Contrast Ct Cervical Spine Wo Contrast  07/19/2019 1132 1. 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 2. No associated hemorrhage and mild  intracranial mass effect at this time, including trace leftward midline shift. 3. Evidence of large vessel occlusion: Hyperdensity of the right ICA terminus, the right A1 and MCA. 4. Unaffected brain parenchyma appears negative. 5.  No acute traumatic injury identified in the cervical spine.   Mr Brain 63 Contrast Mr Angio Head Wo Contrast Mr Angio Neck Wo Contrast 07/19/2019 1330 1. 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 The right ICA terminus, right ACA and MCA also appear occluded. 2. 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 Cytotoxic edema but no convincing hemorrhage. Stable mild mass effect with trace leftward midline shift. 3. Intracranial MRA is degraded by motion artifact, but no other large vessel occlusion is suspected. There appears to be lobes reperfusion of the right PCA. 4. The left hemisphere and posterior fossa brain parenchyma appears normal.   Ct Head Wo Contrast 07/19/2019 1532 Similar appearance to earlier. Low-density and swelling of the right hemisphere in the ACA and MCA territories with mass effect and right-to-left shift of 5-6 mm. No hemorrhagic transformation at this time.   Ct Head Wo Contrast 07/20/2019 0444 1.  Acute right ACA and MCA territory infarct with progressive swelling that has decompressed through the craniectomy defect. 2. No acute hemorrhage or new infarction.   Ct Angio Head W Or Wo Contrast Ct Angio Neck W Or Wo Contrast 07/21/2019 1. Stable from prior MRA. 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 No evidence of atherosclerosis or vasculopathy in the other vessels. 2. Cytotoxic edema causes 5 mm of midline shift and brain bulging through the craniectomy defect. Mild petechial hemorrhage is seen at the basal ganglia.   Ct Head Wo Contrast 07/23/2019 1. Unchanged appearance of massive right MCA and ACA territory infarcts with parenchyma extending through decompressive  craniectomy. 2. Small focus of suspected hemorrhage adjacent to the right caudate head.  Ct Head Wo Contrast 07/30/2019 1. Decreasing mass effect within large right anterior MCA and ACA territory infarct. 2. Midline shift is no longer present. There is decreased effacement of the right lateral ventricle. 3. Infarcted brain tissue still herniates through the craniectomy site. 4. New parenchymal hemorrhage within the infarcted tissue anteriorly measures 2.5 x 2.3 x 2.7 cm. 5. No new infarct.  Ct Head Wo Contrast 08/01/2019  1. Stable head CT since 07/30/2019. 2. Again noted is a large infarct involving the right MCA and right ACA territories with brain tissue herniating through the right craniotomy defect. 3. Parenchymal hemorrhage in the right anterior cortex has minimally changed. Stable petechial hemorrhage in the right basal ganglia region.   Dg Chest Port 1 View 08/01/2019 No acute disease   Ct Angio Chest Pe W Or Wo Contrast 08/01/2019 Nonocclusive thrombus seen within the left posterior lower lobe segmental artery.    2D Echocardiogram  1. The left ventricle has normal systolic function with an ejection fraction of 60-65%. The cavity size was normal. Left ventricular diastolic parameters were normal.  2. The right ventricle has normal systolic function. The cavity was normal. There is no increase in right ventricular wall thickness.  3. The pericardial effusion is circumferential.  4. Trivial pericardial effusion is present.  5. The mitral valve is grossly normal.  6. The tricuspid valve is grossly normal.  7. The aortic valve is tricuspid. No stenosis of the aortic valve.  8. The aorta is normal unless otherwise noted.  9. The aortic root is normal in size and structure. 10. No cardiac source of embolism identified. 11. When compared to the prior study: No comparison.  LE Dopplers 08/02/2019 Right: Findings consistent with acute deep vein thrombosis involving the right peroneal  veins.  Left: Findings consistent with acute deep vein thrombosis involving the left common femoral vein, left femoral vein, left proximal profunda vein, left popliteal vein, left posterior tibial veins, and left peroneal veins. Extending up into left iliac vein  and IVC.  07/21/2019 Right: Findings consistent with acute deep vein thrombosis involving the right peroneal veins. Left: Findings consistent with acute deep vein thrombosis involving the left popliteal vein, left posterior tibial veins, and left peroneal veins.  Vas [place] Transcranial Doppler W Bubbles 07/25/2019 No HITS heard heard at rest. No HITS heard heard during valsalva.    PHYSICAL EXAM   General - Well nourished, well developed, middle-aged African-American male.  He has right hemicraniectomy surgical incision on the scalp.  Ophthalmologic - fundi not visualized due to noncooperation.  Cardiovascular - Regular rate and rhythm.  Neuro - patient is awake, eyes open. Nonverbal, not following simple commands. Eyes in right gaze position, not blinking  to visual threat to the left, PERRL. Left facial droop.  Tongue protrusion not corporative.  Has spontaneous movement of RUE and RLE, 3-4/5 at least. On pain stimulation, mild withdraw of RLE, but no movement of LUE. DTR 1+ and no babinski. Sensation, coordination and gait not tested.   ASSESSMENT/PLAN Mr. SHABAKA FLATTERY is a 43 y.o. male with no significant past medical history found down x 2 days nonverbal with L hemiparesis.   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 Trace L midline shift. ELVO at R ICA, R A1, R MCA.  MRI  Large R brain infarct sparing R PCA. Cytotoxic edema but no hemorrhage. Stable trace midline shift.  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 R  ICA terminus, R ACA, R MCA occluded.   CT head similar w/ low density and sweddling R ACA and MCA territories, now with 5-16mm midline shift. no hemorrhage  CT head acute R ACA and MCA infarct w/ progressive swelling decompressed through craniectomy defect.   CTA head and neck stable MLS. R ICA occlusion in neck that continues into R ACA and MCA. Mild petechial hemorrhage at basal ganglia  CT Head 8/29 - Unchanged appearance of massive right MCA and ACA territory infarcts with parenchyma extending through decompressive craniectomy. Small focus of suspected hemorrhage adjacent to the right caudate head.  CT Head WO - 07/30/19 - New hematoma within the infarcted tissue   CT repeat 08/01/2019 stable w/o change  2D Echo EF 60-65%  LE venous doppler DVT in R peroneal, L popliteal, L posterior tibial and L peroneal  LE venous doppler repeat DVT in R peroneal, L popliteal, L posterior tibial and L peroneal  CT chest Nonocclusive thrombus seen within the left posterior lower lobe segmental artery.   TCD bubble no HITS, no PFO  LDL 83  HgbA1c 5.4  UDS positive THC  Hypercoagulable and autoimmune work up negative  Heparin 1500 subq for VTE prophylaxis  No antithrombotic prior to admission, was on heparin IV but now discontinued due to hemorrhagic conversion with right frontal hematoma  Therapy recommendations:  SNF  Disposition:  pending   Cyctotoxic cerebral edema  S/p R decompressive hemicraniectomy (Nundkumar) w/ flap R abd 07/19/2019  PICC placed - keep for now per Leonie Man  Given One dose of 23.4% 8/26; 3%  off 8/30 1700  Na 146 -> 140  Monitor Na daily  CT repeat 07/30/19 - New parenchymal hemorrhage within the infarcted tissue  Off helmet to further release pressure  CT repeat 08/01/2019 stable w/o change  Right ICA occlusion  MRA and CTA showed right ICA occlusion from origin to terminal  Wife denies any head trauma  Wife stated that pt had recent aggressive exercise  with weight lifting  Concerning dissection as working diagnosis of stroke etiology   B LE DVT Small LLL PE  LE venous doppler DVT in R peroneal, L popliteal, L posterior tibial and L peroneal  Etiology unclear  Started iv heparin 07/26/2019  IVC filter placed 8/27. Plan retrieval in 8-12 weeks as an IP in an IR clinic  Hypercoagulable work up negative   Off SCDs  LE venous doppler repeat DVT in R peroneal, L popliteal, L posterior tibial and L peroneal  CT chest Nonocclusive thrombus seen  within the left posterior lower lobe segmental artery.   hydrated w/ IVF @ 50cc, continue free water and TF  Was on IV Heparin but now off secondary to intracranial hemorrhage  Treatment dilemma given her stable neuro exam and CT head repeat documented stability of hematoma, I am willing to restart heparin IV and close neuro monitoring. However, wife has difficulty with decision given risks in either way. She is asking for second opinion and I will have Dr. Leonie Man to call her.  Seizure-like activity  Continue Keppra   EEG continuous slowing, excessive beta activity, sleep spindle asymmetry decreased R->related to stroke and sedation. No SZ  Long Term EEG cortical dysfunction in right frontal region  Seizure precautions  Febrile, Leukocytosis  Tmax - 101.8->99.7->101.8->101.5->99.6->98.3  WBC - 11.8->14.5->16.1->13.5->17.5  CXR 08/01/2019 NAD  U/A x 2 - negative  Blood cultures - NGTD, repeat BCx no growth 1 d  on empiric unasyn  Lactic acid 1.2->2.0  Tachycardia, improving  HR 98-112  likely due to fever and dehydration and PE  Hydration with IVF, TF and free water  Continue metoprolol 50 twice daily  EKG ST, rate 120 w/ possible LA enlargement  Troponin series neg x 3    Consider cardiology consult if not improving  Hyperlipidemia  Home meds:  no statin  LDL 83, goal < 70  Was on lipitor 20 mg daily   D/c statin due to elevated LFT  Dysphagia . Secondary  to stroke . NPO  Cortrak w/ TF @ 55cc  On free water 200cc q4  Speech on board - continue NPO  Other Stroke Risk Factors  Former Cigarette smoker, quit 3 yrs ago  ETOH use  Substance abuse UDS - positive for THC   Obesity, Body mass index is 31.15 kg/m., recommend weight loss, diet and exercise as appropriate   Other Active Problems  Elevated LFTs -  AST - 132->219->97->63 ; ALT - 68->244->174->116  - improving   Hypokalemia - 3.5->3.4 - supplement ->3.5- 3.2->3.4 - replace - 4.5  Hospital day # 14  I spent 48minutes in total face-to-face time with the patient, more than 50% of which was spent in counseling and coordination of care, reviewing test results, images and medication, and discussing the diagnosis of continued fever, lethargy, elevated LFT, large right MCA infarct cerebral edema LE DVT, and tachycardia, treatment plan and potential prognosis. This patient's care requiresreview of multiple databases, neurological assessment, and medical decision making of high complexity. I had long discussion with wife at the bedside, updated pt current condition, treatment plan and potential prognosis, and answered all her questions.  We have discussed about hemorrhagic conversion and extensive DVT and PE, discussed about anticoagulation options. She has difficulty with decision, and request second opinion and I will ask Dr. Leonie Man to call her.    Rosalin Hawking, MD PhD Stroke Neurology 08/02/2019 3:30 PM   To contact Stroke Continuity provider, please refer to [email or web address] After hours, contact General Neurology

## 2019-08-02 NOTE — Progress Notes (Signed)
  Speech Language Pathology Treatment: Dysphagia;Cognitive-Linquistic  Patient Details Name: Matthew Black MRN: [id number] DOB: 1975-12-17 Today's Date: 08/02/2019 Time:  -     Assessment / Plan / Recommendation Clinical Impression  Session focused on functional cognition and swallowing.  Pt intermittently alert with eyes opened.  He demonstrated improved attention to ice chip bolus approaching lips, manipulating and eventually swallowing with palpable, though minimal, laryngeal movement.  Unable to follow simple commands with modeling and multimodal cues; however, when his wife entered room, he became more attentive, reaching for her hand with his RUE and squeezing her hand in response to her modeling.  No vocalizations could be elicited.  Discussed with Mrs. Matthew Black the plan for swallowing, and that at some point he would benefit from instrumental swallow study to obtain baseline information that will help [place] direct care. Did not discuss possibility of PEG, as she verbalized being overwhelmed and needed encouragement and support. Continue SLP for cognitive-communication and swallowing; follow for readiness for MBS/FEES.  *  HPI HPI: Pt is a 43 y.o. with no known PMH who presents on 8/25 after being found down at work, nonverbal with L hemiparesis. 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 Stable trace midline shift. S/p hemicraniectomy with abdominal flap implant. Intubated 8/26-8/31. CXR worsened atelectasis in the right lower lobe.      SLP Plan  Continue with current plan of care       Recommendations  Diet recommendations: NPO Medication Administration: Via alternative means                Oral Care Recommendations: Oral care QID Follow up Recommendations: 24 hour supervision/assistance;Inpatient Rehab SLP Visit Diagnosis: Dysphagia, unspecified (R13.10);Cognitive communication deficit (R41.841) Plan: Continue with current plan of  care       [place]. Tivis Ringer, [place] CCC/SLP Acute Rehabilitation Services Office number [phone] Pager [phone]    Assunta Curtis 08/02/2019, 3:49 PM

## 2019-08-02 NOTE — Progress Notes (Addendum)
ANTICOAGULATION CONSULT NOTE  Pharmacy Consult:  Heparin Indication: DVT/PE  Allergies  Allergen Reactions  . Hydrocodone Nausea Only    Patient Measurements: Height: 5\' 11"  (180.3 cm) Weight: 223 lb 5.2 oz (101.3 kg) IBW/kg (Calculated) : 75.3 Heparin Dosing Weight: 96 kg  Vital Signs: Temp: 100 F (37.8 C) (09/08 1700) Temp Source: Axillary (09/08 1700) BP: 131/76 (09/08 1700) Pulse Rate: 115 (09/08 1700)  Labs: Recent Labs    07/31/19 0715 08/01/19 0450 08/01/19 1805 08/01/19 2217 08/02/19 0222  HGB 13.8 12.2*  --   --  11.9*  HCT 42.4 37.5*  --   --  35.6*  PLT PLATELET CLUMPS NOTED ON SMEAR, UNABLE TO ESTIMATE 214  --   --  198  HEPARINUNFRC <0.10*  --   --   --   --   CREATININE 0.95 1.14  --   --  1.05  TROPONINIHS  --   --  8 10 13     Estimated Creatinine Clearance: 112.2 mL/min (by C-G formula based on SCr of 1.05 mg/dL).  Assessment: Patient had an acute ischemic stroke and he did not receive tPA. Incidental finding of DVT and IVC filter was placed on 8/27 because patient was not a candidate for anticoagulation due to craniectomy. Was cleared for IV heparin; however, 9/5 hemorrhagic conversion with hematoma.   Hgb 11.9, plt 198. CT on 9/6 stable. On SQH for DVT ppx. CTA showing non-occlusive thrombus with L posterior lower lobe segmental artery. Okay per Neuro to start IV heparin - will target low end of goal and utilize no bolus approach.   Goal of Therapy:  Heparin level 0.3-0.5 units/mL  Monitor platelets by anticoagulation protocol: Yes   Plan:  - Start heparin at 1150 units/hr  - Order heparin level in 6 hours - Daily heparin level and CBC - Follow up plan for oral anticoagulation  Antonietta Jewel, PharmD, BCCCP Clinical Pharmacist  Phone: [phone]  Please check AMION for all [place] phone numbers After 10:00 PM, call [place] [phone] 08/02/2019

## 2019-08-02 NOTE — Progress Notes (Signed)
Wife very concerned and stressed because found out earlier today that he has clot in legs (big one in the left) and also clot in lungs.  She was told that he needed Hep gtt he was on it a few days ago and stopped because of a bleed on the brain.  Said she is fearful because she needs to find out which is the bigger risk and she wants to make the right decision.  She was very anxious and this nurse has contacted Dr several times today for her and she has requested second and third opinions and also for other specialties to get involved.  She was asking about a hematologist and a pulmonologist.  Dr Erlinda Hong did confer with his colleagues and decided to get Internal Medicine involved and Dr Lorin Mercy came up to consult with wife to come up with the best and safest solution.  Dr Lorin Mercy did tell charge nurse that pt would need q 1 hour neuro checks and charge nurse told her that would not be possible on this unit, as our case load is 4 or 5 to 1 nurse.   Dr said she would put in consult for ICU transfer.  Pt is resting comfortable without signs or resp distress.  Is non verbal but follows simple commands.

## 2019-08-02 NOTE — Consult Note (Signed)
Medical Consultation   Matthew Black  [id number]  DOB: Mar 22, 1976  DOA: 07/19/2019  PCP: Colon Branch, MD   Outpatient Specialists: None   Requesting physician: Erlinda Hong - neurology  Reason for consultation: Young patient with R MCA/ICA occlusion, large stroke, hemicraniectomy.  Awake/alert but non-verbal and so transferred to floor.  DVT, had IVC filter placed.  Had fever, no source, started Unasyn.  Persistent tachycardia, given IVF.  Improved fever.  CTA chest with non-occlusive PE.  CT with hemorrhagic conversion in R frontal area.  No heparin due to brain bleed.  Extensive and progressive DVT on rescan, maybe DVT/PE is contributing.  Talked to wife re: treatment with Heparin - risks/benefits.  Wife seems lost and not clear on decisions.  Drs. Jolene Schimke, and Sethi have counseled wife, but she prefers to talk with medicine doctor.    History of Present Illness: Matthew Black is an 43 y.o. male without significant known PMH who presented on 8/25 with AMS as well as L hemiparesis and aphasia.  UDS + for THC.  CT with large R hemispheric infarct with confluent cytotoxic edema in R ACA and MCA territories and mild mass effect.  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  He was admitted to [facility] with neurology consultation and started on 3% saline.  Neurosurgery was consulted and Dr. Kathyrn Sheriff recommended early decompressive hemicraniectomy, which was performed that night.   He subsequently developed hemorrhagic conversion with hematoma on 9/5; helmet was removed to further release intracranial pressure and heparin was discontinued. LE doppler also indicated DVT in R peroneal, L popliteal, L posterior tibial, and L peroneal veins; IVC filter was placed.   He has had seizure-like activity, for which he is on Keppra.  He was subsequently found to have possible RLL infiltrate and with concern for aspiration he was made NPO and started on Unasyn.  CTA was performed and it  showed a non-occlusive LLL thrombus last night.    Subsequent repeat DVT US was performed today and showed acute R DVT of the peroneal veins as well as acute L DVT involving common femoral vein, L femoral vein, L proximal profunda vein, L popliteal vein, L posterior tibial veins, and L peroneal veins - and it extends up into the left iliac vein and IVC.  I saw the patient, who is unable to provide history because he is aphasic.  He followed the command to squeeze my fingers with his right hand and intermittently used his right arm to scratch at his face and head.  He has left hemiplegia.  His wife reports that he can do thumbs up/down in response to some questions, although he did not demonstrate this for me; he did snap his fingers.     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    Past Surgical History: Past Surgical History:  Procedure Laterality Date   CRANIOTOMY Right 07/19/2019   Procedure: RIGHT HEMI-CRANIECTOMY With implantation of skull flap to abdominal wall;  Surgeon: Consuella Lose, MD;  Location: [place];  Service: Neurosurgery;  Laterality: Right;   CYST EXCISION N/A 10/08/2016   Procedure: EXCISION OF POSTERIOR NECK CYST;  Surgeon: Clovis Riley, MD;  Location: [facility];  Service: General;  Laterality: N/A;   INCISION AND DRAINAGE ABSCESS N/A 09/22/2014  Procedure: INCISION AND DRAINAGE ABSCESS POSTERIOR NECK;  Surgeon: Pedro Earls, MD;  Location: [facility];  Service: General;  Laterality: N/A;   INCISION AND DRAINAGE ABSCESS N/A 12/20/2015   Procedure: INCISION AND DRAINAGE POSTERIOR NECK MASS;  Surgeon: Armandina Gemma, MD;  Location: [facility];  Service: General;  Laterality: N/A;   INCISION AND DRAINAGE ABSCESS Left 07/10/2004   middle finger   IR IVC FILTER PLMT / S&I /IMG GUID/MOD SED  07/21/2019   IR VENOGRAM RENAL UNI RIGHT  07/21/2019   MASS EXCISION N/A 07/21/2017     Procedure: EXCISION OF BENIGN NECK LESION WITH LAYERED CLOSURE;  Surgeon: Irene Limbo, MD;  Location: [place];  Service: Plastics;  Laterality: N/A;   MASS EXCISION N/A 11/10/2017   Procedure: EXCISION BENIGN LESION OF THE NECK WITH LAYERED CLOSURE;  Surgeon: Irene Limbo, MD;  Location: [place];  Service: Plastics;  Laterality: N/A;     Allergies:   Allergies  Allergen Reactions   Hydrocodone Nausea Only     Social History:  reports that he quit smoking about 3 years ago. He smoked 0.00 packs per day. He has never used smokeless tobacco. He reports current alcohol use. He reports that he does not use drugs.   Family History: Family History  Problem Relation Age of Onset   Diabetes Other        GF   Healthy Mother    Healthy Father       Physical Exam: Vitals:   08/02/19 0500 08/02/19 0700 08/02/19 1100 08/02/19 1700  BP:  (!) 166/73 123/81 131/76  Pulse:  (!) 112 98 (!) 115  Resp:  17 17   Temp:  98.2 F (36.8 C) 98.3 F (36.8 C) 100 F (37.8 C)  TempSrc:  Axillary Axillary Axillary  SpO2:  100% 100% 100%  Weight: 101.3 kg     Height:        Constitutional: Alert and awake, not in any acute distress.  Large hemicraniectomy scar appreciated. Eyes:  irises appear normal, anicteric sclera, right gaze deviation ENMT: external ears and nose appear normal, normal hearing, Lips appear normal, Corepack in place Neck: neck appears normal, no masses, no JVD  CVS: S1-S2 clear, no murmur rubs or gallops Respiratory:  clear to auscultation bilaterally, no wheezing, rales or rhonchi. Respiratory effort normal. No accessory muscle use.  Abdomen: soft nontender, nondistended, dressing covering RLQ bone flap incision Musculoskeletal: : diffuse LLE edema from foot to groin without apparent skin abnormality Neuro: mild left facial droop; left hemiplegia Psych:  Alert, aphasic, minimally follows commands Skin: no rashes or  lesions or ulcers, no induration or nodules on limited exam   Data reviewed:  I have personally reviewed the recent labs and imaging studies  Pertinent Labs:   Glucose 142 BUN 24/Creatinine 1.05/GFR >60 Albumin 3.0 AST 62/ALT 116 HS troponin 13 WBC 17.5 Hgb 11.9   Inpatient Medications:   Scheduled Meds:  chlorhexidine  15 mL Mouth Rinse BID   Chlorhexidine Gluconate Cloth  6 each Topical Daily   famotidine  20 mg Per Tube BID   feeding supplement (PRO-STAT SUGAR FREE 64)  30 mL Per Tube BID   free water  200 mL Per Tube Q4H   heparin injection (subcutaneous)  5,000 Units Subcutaneous Q8H   levETIRAcetam  500 mg Per Tube BID   mouth rinse  15 mL Mouth Rinse q12n4p   metoprolol tartrate  50 mg Oral BID   sodium chloride flush  10-40 mL Intracatheter Q12H   Continuous Infusions:  sodium chloride 10 mL/hr at 08/01/19 1715   sodium chloride 50 mL/hr at 08/02/19 1648   ampicillin-sulbactam (UNASYN) IV 3 g (08/02/19 1647)   feeding supplement (OSMOLITE 1.5 CAL) 1,000 mL (08/02/19 1434)     Radiological Exams on Admission: Ct Head Wo Contrast  Result Date: 08/01/2019 CLINICAL DATA:  Stroke follow-up.  History of craniotomy. EXAM: CT HEAD WITHOUT CONTRAST TECHNIQUE: Contiguous axial images were obtained from the base of the skull through the vertex without intravenous contrast. COMPARISON:  07/30/2019 FINDINGS: Brain: Again noted is a right craniotomy with infarcted brain tissue herniating through the craniotomy site. Large infarct involving the right MCA and ACA territories. The degree of mass effect is similar to the recent comparison examination. There is a cavum septum pellucidum. No significant midline shift. The parenchymal hemorrhage along the right anterior cortex has minimally changed and measures 2.5 x 2.1 x 3.0 cm and previously measured 2.5 x 2.3 x 2.7 cm. Again noted is a small focus of gas just anterior to the hemorrhage. No significant enlargement of the  lateral ventricles. In addition, there are hyperdense foci in the right basal ganglia region which probably represent petechial hemorrhages but minimally changed from the previous examination. Vascular: No hyperdense vessel or unexpected calcification. Skull: Again noted is a large right craniotomy defect. Sinuses/Orbits: Paranasal sinuses are aerated with minimal mucosal thickening. There is evidence for a feeding tube in the left nostril. Other: None. IMPRESSION: 1. Stable head CT since 07/30/2019. 2. Again noted is a large infarct involving the right MCA and right ACA territories with brain tissue herniating through the right craniotomy defect. 3. Parenchymal hemorrhage in the right anterior cortex has minimally changed. Stable petechial hemorrhage in the right basal ganglia region. Electronically Signed   By: Markus Daft M.D.   On: 08/01/2019 09:31   Ct Angio Chest Pe W Or Wo Contrast  Result Date: 08/01/2019 CLINICAL DATA:  Positive for bilateral DVT EXAM: CT ANGIOGRAPHY CHEST WITH CONTRAST TECHNIQUE: Multidetector CT imaging of the chest was performed using the standard protocol during bolus administration of intravenous contrast. Multiplanar CT image reconstructions and MIPs were obtained to evaluate the vascular anatomy. CONTRAST:  49mL OMNIPAQUE IOHEXOL 350 MG/ML SOLN COMPARISON:  None. FINDINGS: Cardiovascular: There is a optimal opacification of the pulmonary arteries. There is nonocclusive thrombus seen in the posterior left lower lobe segmental artery, series 5, image 66. The heart is normal in size. No pericardial effusion thickening. No evidence right heart strain. There is normal three-vessel brachiocephalic anatomy without proximal stenosis. The thoracic aorta is normal in appearance. A right-sided PICC is seen with the tip in the right atrium. The. Mediastinum/Nodes: No hilar, mediastinal, or axillary adenopathy. Thyroid gland, trachea, and esophagus demonstrate no significant findings.  Lungs/Pleura: Small amount bibasilar dependent atelectasis is seen. Upper Abdomen: No acute abnormalities present in the visualized portions of the upper abdomen. NG tube is seen within the stomach. Musculoskeletal: No chest wall abnormality. No acute or significant osseous findings. Review of the MIP images confirms the above findings. IMPRESSION: Nonocclusive thrombus seen within the left posterior lower lobe segmental artery. These results will be called to the ordering clinician or representative by the Radiologist Assistant, and communication documented in the PACS or zVision Dashboard. Electronically Signed   By: Prudencio Pair M.D.   On: 08/01/2019 21:45   Dg Chest Port 1 View  Result Date: 08/01/2019 CLINICAL DATA:  Fever in patient status post stroke 07/19/2019. EXAM:  PORTABLE CHEST 1 VIEW COMPARISON:  Single-view of the chest 07/30/2019. PA and lateral chest 07/28/2019. FINDINGS: Right PICC and feeding tube remain in place. The lungs are clear. Heart size is normal. No pneumothorax or pleural fluid. No bony abnormality. IMPRESSION: No acute disease. Electronically Signed   By: Inge Rise M.D.   On: 08/01/2019 09:19   Vas [place] Lower Extremity Venous (dvt)  Result Date: 08/02/2019  Lower Venous Study Indications: Swelling, and follow up DVT. Other Indications: IVC filter placed 07/21/19. Risk Factors: Pulmonary embolism 08/01/19. Anticoagulation: No AC due to brain bleed. Comparison Study: 07/21/19 Right PTV, Pero Left Pop, PTV, Pero Performing Technologist: June Leap RDMS, RVT  Examination Guidelines: A complete evaluation includes B-mode imaging, spectral Doppler, color Doppler, and power Doppler as needed of all accessible portions of each vessel. Bilateral testing is considered an integral part of a complete examination. Limited examinations for reoccurring indications may be performed as noted.  +---------+---------------+---------+-----------+----------+--------------+  RIGHT      Compressibility Phasicity Spontaneity Properties Thrombus Aging  +---------+---------------+---------+-----------+----------+--------------+  CFV       Full            Yes       Yes                                    +---------+---------------+---------+-----------+----------+--------------+  SFJ       Full                                                             +---------+---------------+---------+-----------+----------+--------------+  FV Prox   Full                                                             +---------+---------------+---------+-----------+----------+--------------+  FV Mid    Full                                                             +---------+---------------+---------+-----------+----------+--------------+  FV Distal Full                                                             +---------+---------------+---------+-----------+----------+--------------+  PFV       Full                                                             +---------+---------------+---------+-----------+----------+--------------+  POP       Full  Yes       Yes                                    +---------+---------------+---------+-----------+----------+--------------+  PTV       Full                                                             +---------+---------------+---------+-----------+----------+--------------+  PERO      None                                             mid segment     +---------+---------------+---------+-----------+----------+--------------+   +---------+---------------+---------+-----------+----------+--------------+  LEFT      Compressibility Phasicity Spontaneity Properties Thrombus Aging  +---------+---------------+---------+-----------+----------+--------------+  CFV       None            No        No                                     +---------+---------------+---------+-----------+----------+--------------+  SFJ       None                                                              +---------+---------------+---------+-----------+----------+--------------+  FV Prox   None                                                             +---------+---------------+---------+-----------+----------+--------------+  FV Mid    None                                                             +---------+---------------+---------+-----------+----------+--------------+  FV Distal None                                                             +---------+---------------+---------+-----------+----------+--------------+  PFV       None                                                             +---------+---------------+---------+-----------+----------+--------------+  POP  None            No        No                                     +---------+---------------+---------+-----------+----------+--------------+  PTV       None                                                             +---------+---------------+---------+-----------+----------+--------------+  PERO      None                                                             +---------+---------------+---------+-----------+----------+--------------+  Iliac     None            No        No                                     +---------+---------------+---------+-----------+----------+--------------+ Thrombus of left leg extends up to IVC filter    Summary: Right: Findings consistent with acute deep vein thrombosis involving the right peroneal veins. Left: Findings consistent with acute deep vein thrombosis involving the left common femoral vein, left femoral vein, left proximal profunda vein, left popliteal vein, left posterior tibial veins, and left peroneal veins. Extending up into left iliac vein  and IVC.  *See table(s) above for measurements and observations. Electronically signed by Servando Snare MD on 08/02/2019 at 1:07:45 PM.    Final     Impression/Recommendations Active Problems:   Stroke (cerebrum) ([place])    Pressure injury of skin   Acute respiratory failure ([place])   Endotracheal tube present   Deep vein thrombosis (DVT) of non-extremity vein   Hypokalemia   FUO (fever of unknown origin)   Acute blood loss anemia   SIRS (systemic inflammatory response syndrome) (H[facility])   Leukocytosis   Review of hospital course to date: -This is an absolutely tragic situation, fraught with complications. 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 -He was admitted on 8/25 and had decompressive crani; he returned to the ICU on a vent. -Normal Echo on 8/25; does not appear to have been a bubble study -He developed possible seizure activity on 8/26, LTM negative for seizures but with cortical dysfunction of R frontal region thought to be 2* to CVA and cerebral edema -IVC filter was placed 8/27 -B DVT US + for B DVT on 8/28 -8/29 and 9/5 CTs with progressive hemorrhage, so Heparin stopped on 9/5 -8/31 Transcranial doppler with bubble study was negative -Patient developed fever and had normal CXR 9/7 -CTA on 9/7 with nonocclusive thrombus in LLL segmental artery -Vascular DVT US today with peroneal RLE DVT and extensive LLE DVTs extending all the way up into IVC filter -TRH was asked to offer additional support to the patient's wife.  Discussions with consultants: -Patient discussed with Dr. Donzetta Matters regarding catheter-directed therapeutic options.  All interventions would require heparin.  Cannot remove IVF filter, extensive current may occlude filter, likely needs Heparin.  Very limited interventional options.  Dr. Donzetta Matters will be happy to consult if further reinforcement with wife is needed. -Patient discussed with Dr. Benay Spice.  There is no coagulopathy service in [place], consider transfer to [facility].  He could have an underlying malignancy.  Could consult Dr. Irene Limbo to see him tomorrow to offer support. -I then spoke with  Dr. Kaylyn Layer from Neurology at [facility] - this situation is complicated, but not uncommon.  Large strokes often get hemorrhagic conversion - data does not justify that [facility] will worsen conversion and so likely is appropriate.  DVT/PE is life-threatening, should restart Heparin now.  Neurology there likely has little to offer - we have neurosurgery here and he likely does not need coagulopathy service.  From hematology side - ?coagulopathy service.  Unsure what they would offer.  Recommend that patient remain at [facility] at this time.  Finally, I called back and discussed with his wife.  She is quite reassured and agrees with the plan for initiation of Heparin at this time. -Will transfer to neuro ICU for hourly neuro checks. -Will start heparin per pharmacy - no bolus -Echo could have been with a bubble study to diagnose a PFO that could have led to this.  This does not appear to have been done.  Will request Echo with bubble study. -Hematology consult with Dr. Irene Limbo tomorrow; Dr. Benay Spice is aware and Dr. Irene Limbo has been added to the treatment team. -Palliative care consultation could be appropriate to offer further support to his wife.   Thank you for this interesting consultation.  Our [facility] hospitalist team will follow the patient with you.    Total critical care time: 120 minutes Critical care time was exclusive of separately billable procedures and treating other patients. Critical care was necessary to treat or prevent imminent or life-threatening deterioration. Critical care was time spent personally by me on the following activities: development of treatment plan with patient and/or surrogate as well as nursing, discussions with consultants, evaluation of patient's response to treatment, examination of patient, obtaining history from patient or surrogate, ordering and performing treatments and interventions, ordering and review of laboratory studies, ordering and review of radiographic studies, pulse  oximetry and re-evaluation of patient's condition.   Time Spent: 120 minutes  Karmen Bongo M.D. [facility] 08/02/2019, 5:48 PM

## 2019-08-02 NOTE — Progress Notes (Signed)
Had a lengthy telephone discussion with the patient's wife lasting over 30 minutes. She had several questions regarding the hospital course with regard to the timing of changes to heparin dosing and route of administration (prophylactic sq versus therapeutic IV) and the small hemorrhage that occurred in the anterior portion of the large right hemispheric stroke. She also had questions regarding the PE seen on CTA chest performed yesterday evening (9/7). I answered her questions to the best of my ability, including explanation of the pathophysiology of stroke and hemorrhage, risk of PE on versus off IV heparin, the right frontal ICH being a contraindication to IV heparin at this time and risk of clot propagation within the partially occluded pulmonary artery branch. The patient's wife indicated that she was not at the point where she had received the level of education that she desired despite several questions being answered and re-explained several times. She may benefit from an in person conference regarding the patient's condition with the Stroke Team sometime later this week.   Electronically signed: Dr. Kerney Elbe

## 2019-08-02 NOTE — Plan of Care (Signed)
I had long discussion with wife at bedside today. Pt recently had multiple new medical issues, including new hematoma at right frontal due to hemorrhagic conversion, left LE DVT extended to IVC filter, non-occlusive PE, tachycardia, fever and leukocytosis. Neuro stable. Explained to wife that pt current condition posed medical dilemma on treatment. On the one hand, he has hemorrhagic conversion with right frontal hematoma but on the other hand, he has significant LE DVT s/p IVC filter and now PE. The treatment for these conditions are totally different and may contraindicated each other, and both conditions have their own risk if left untreated. However, given her stable neuro exam and CT head repeat documented stability of hematoma, I am willing to restart heparin IV and close monitoring. The wife has difficulty with decision given risks in either condition. She has asked for second opinion and I had Dr. Leonie Man to talk to her over the phone also. As per Dr. Leonie Man, pt wife further requested internal medicine consult.   Rosalin Hawking, MD PhD Stroke Neurology 08/02/2019 6:53 PM

## 2019-08-02 NOTE — Progress Notes (Signed)
LE venous duplex       has been completed. Preliminary results can be found under CV proc through chart review. June Leap, BS, RDMS, RVT    Positive results of extending thrombus discussed with Dr. Erlinda Hong

## 2019-08-03 ENCOUNTER — Inpatient Hospital Stay (HOSPITAL_COMMUNITY): Payer: Medicaid Other

## 2019-08-03 ENCOUNTER — Encounter (HOSPITAL_COMMUNITY): Payer: Self-pay | Admitting: Oncology

## 2019-08-03 DIAGNOSIS — I8290 Acute embolism and thrombosis of unspecified vein: Secondary | ICD-10-CM

## 2019-08-03 DIAGNOSIS — I6389 Other cerebral infarction: Secondary | ICD-10-CM

## 2019-08-03 DIAGNOSIS — D6859 Other primary thrombophilia: Secondary | ICD-10-CM

## 2019-08-03 DIAGNOSIS — D72825 Bandemia: Secondary | ICD-10-CM

## 2019-08-03 DIAGNOSIS — I2699 Other pulmonary embolism without acute cor pulmonale: Secondary | ICD-10-CM

## 2019-08-03 LAB — GLUCOSE, CAPILLARY
Glucose-Capillary: 112 mg/dL — ABNORMAL HIGH (ref 70–99)
Glucose-Capillary: 118 mg/dL — ABNORMAL HIGH (ref 70–99)
Glucose-Capillary: 122 mg/dL — ABNORMAL HIGH (ref 70–99)
Glucose-Capillary: 126 mg/dL — ABNORMAL HIGH (ref 70–99)
Glucose-Capillary: 132 mg/dL — ABNORMAL HIGH (ref 70–99)
Glucose-Capillary: 133 mg/dL — ABNORMAL HIGH (ref 70–99)
Glucose-Capillary: 156 mg/dL — ABNORMAL HIGH (ref 70–99)

## 2019-08-03 LAB — BASIC METABOLIC PANEL
Anion gap: 10 (ref 5–15)
BUN: 24 mg/dL — ABNORMAL HIGH (ref 6–20)
CO2: 22 mmol/L (ref 22–32)
Calcium: 8.7 mg/dL — ABNORMAL LOW (ref 8.9–10.3)
Chloride: 104 mmol/L (ref 98–111)
Creatinine, Ser: 0.95 mg/dL (ref 0.61–1.24)
GFR calc Af Amer: >60 mL/min (ref >60)
GFR calc non Af Amer: >60 mL/min (ref >60)
Glucose, Bld: 138 mg/dL — ABNORMAL HIGH (ref 70–99)
Potassium: 4.2 mmol/L (ref 3.5–5.1)
Sodium: 136 mmol/L (ref 135–145)

## 2019-08-03 LAB — CBC
HCT: 34.3 % — ABNORMAL LOW (ref 39.0–52.0)
Hemoglobin: 11.2 g/dL — ABNORMAL LOW (ref 13.0–17.0)
MCH: 28.5 pg (ref 26.0–34.0)
MCHC: 32.7 g/dL (ref 30.0–36.0)
MCV: 87.3 fL (ref 80.0–100.0)
Platelets: 185 10*3/uL (ref 150–400)
RBC: 3.93 MIL/uL — ABNORMAL LOW (ref 4.22–5.81)
RDW: 13.8 % (ref 11.5–15.5)
WBC: 16.4 10*3/uL — ABNORMAL HIGH (ref 4.0–10.5)
nRBC: 0 % (ref 0.0–0.2)

## 2019-08-03 LAB — HEPARIN LEVEL (UNFRACTIONATED)
Heparin Unfractionated: 0.22 IU/mL — ABNORMAL LOW (ref 0.30–0.70)
Heparin Unfractionated: 0.35 IU/mL (ref 0.30–0.70)

## 2019-08-03 MED ORDER — WHITE PETROLATUM EX OINT
TOPICAL_OINTMENT | CUTANEOUS | Status: AC
  Administered 2019-08-03: 20:00:00
  Filled 2019-08-03: qty 28.35

## 2019-08-03 MED ORDER — FREE WATER
200.0000 mL | Freq: Three times a day (TID) | Status: DC
  Administered 2019-08-03 – 2019-08-04 (×4): 200 mL

## 2019-08-03 MED ORDER — METOPROLOL TARTRATE 50 MG PO TABS
50.0000 mg | ORAL_TABLET | Freq: Three times a day (TID) | ORAL | Status: DC
  Administered 2019-08-03 – 2019-09-05 (×97): 50 mg
  Filled 2019-08-03 (×97): qty 1

## 2019-08-03 MED ORDER — OSMOLITE 1.5 CAL PO LIQD
1000.0000 mL | ORAL | Status: DC
  Administered 2019-08-03 – 2019-08-09 (×8): 1000 mL
  Filled 2019-08-03 (×11): qty 1000

## 2019-08-03 MED ORDER — PRO-STAT SUGAR FREE PO LIQD
30.0000 mL | Freq: Three times a day (TID) | ORAL | Status: DC
  Administered 2019-08-03 – 2019-08-31 (×84): 30 mL
  Filled 2019-08-03 (×82): qty 30

## 2019-08-03 MED ORDER — METOPROLOL TARTRATE 50 MG PO TABS
50.0000 mg | ORAL_TABLET | Freq: Three times a day (TID) | ORAL | Status: DC
  Administered 2019-08-03: 14:00:00 50 mg via ORAL
  Filled 2019-08-03 (×2): qty 1

## 2019-08-03 NOTE — Consult Note (Addendum)
[facility]  [phone] Fax:([phone] [place]   Referring MD:  Dr. Karmen Bongo  Reason for Referral: DVT/PE  HPI: Matthew Black is a 43 year old male with no significant past medical history.  He presented to the emergency room after being found down on the day of admission.  The wife reported that she last saw the patient the morning prior to admission before he left for work at approximately 8:30 AM.  She then spoke to him briefly over the phone before noon yesterday.  He was taking an online class at that time and he appeared to be at his baseline.  She did not hear back from him the rest of the day. He did not get home last night, which is not unusual for him when he has busy days at work. This morning at 9 AM he was found down on the floor next to his desk by one of his coworkers.  The patient had dried vomit around his mouth and was covered in urine.  The patient was nonverbal and was not moving the left side of his body.  Upon arrival to the emergency room, a CT of the head showed a large right hemisphere infarct with confluent cytotoxic edema in the right ACA and MCA territories and mild intracranial mass-effect.  No hemorrhage.  MRA showed complete occlusion of the right ICA and right ACA/MCA occlusion.  Was not given because he was out of the window.  The patient underwent decompressive right hemicraniotomy on 07/19/2019.  The patient developed hemorrhagic conversion with hematoma on 07/30/2019.  He was on IV heparin at the time. A Doppler ultrasound was performed which showed DVT in the right peroneal, left popliteal, left posterior tibial, and left peroneal veins.  IVC filter was placed.  He has been having seizure-like activity and has been placed on Keppra.  He had a CT angiogram of the chest which showed a nonocclusive left lower lobe thrombus.  A repeat Doppler ultrasound showed acute right DVT of the peroneal veins as well as  acute left DVT involving the common femoral vein, left femoral vein, left proximal profunda vein, left popliteal vein, left posterior tibial veins, and left peroneal veins and extends into the left iliac vein and IVC.  The patient was restarted on IV heparin due to the significant lower extremity DVT status post IVC filter and now pulmonary embolism.  This was restarted on 08/02/2019.  Thus far, he has not had any recurrent bleeding.  When seen today, the patient's wife is at the bedside.  She provides most of the history.  The patient opens his eyes and is able to nod his head briefly but does not really respond to my questioning.  The patient's wife confirms to me that he had no significant past medical history.  He did not have any history of clots or bleeding disorders.  The patient's wife tells me that she recently found out that the referral of the patient's maternal aunts have had blood clots in their legs.  Upon further questioning, it does not sound as though these were provoked events.  Prior to this admission, the patient's wife states that he did not have any headaches or blurred vision.  He has not had any problems with anorexia or weight loss.  No chest discomfort, shortness of breath, abdominal pain, nausea, vomiting, constipation, diarrhea.  He has a history of tobacco use in the past and quit in 2016.  Drinks  alcohol socially.  Hematology was asked see the patient today recommendations regarding management of anticoagulation for his PE and DVT.   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  :    Past Surgical History:  Procedure Laterality Date   CRANIOTOMY Right 07/19/2019   Procedure: RIGHT HEMI-CRANIECTOMY With implantation of skull flap to abdominal wall;  Surgeon: Consuella Lose, MD;  Location: [facility];  Service: Neurosurgery;  Laterality: Right;   CYST EXCISION N/A 10/08/2016   Procedure: EXCISION OF POSTERIOR NECK CYST;  Surgeon: Clovis Riley,  MD;  Location: [facility];  Service: General;  Laterality: N/A;   INCISION AND DRAINAGE ABSCESS N/A 09/22/2014   Procedure: INCISION AND DRAINAGE ABSCESS POSTERIOR NECK;  Surgeon: Pedro Earls, MD;  Location: [facility];  Service: General;  Laterality: N/A;   INCISION AND DRAINAGE ABSCESS N/A 12/20/2015   Procedure: INCISION AND DRAINAGE POSTERIOR NECK MASS;  Surgeon: Armandina Gemma, MD;  Location: [facility];  Service: General;  Laterality: N/A;   INCISION AND DRAINAGE ABSCESS Left 07/10/2004   middle finger   IR IVC FILTER PLMT / S&I /IMG GUID/MOD SED  07/21/2019   IR VENOGRAM RENAL UNI RIGHT  07/21/2019   MASS EXCISION N/A 07/21/2017   Procedure: EXCISION OF BENIGN NECK LESION WITH LAYERED CLOSURE;  Surgeon: Irene Limbo, MD;  Location: [facility];  Service: Plastics;  Laterality: N/A;   MASS EXCISION N/A 11/10/2017   Procedure: EXCISION BENIGN LESION OF THE NECK WITH LAYERED CLOSURE;  Surgeon: Irene Limbo, MD;  Location: [facility];  Service: Plastics;  Laterality: N/A;  :   CURRENT MEDS: Current Facility-Administered Medications  Medication Dose Route Frequency Provider Last Rate Last Dose   0.9 %  sodium chloride infusion   Intravenous PRN Donzetta Starch, NP 10 mL/hr at 08/01/19 1715     0.9 %  sodium chloride infusion   Intravenous Continuous Rosalin Hawking, MD 75 mL/hr at 08/03/19 1004     acetaminophen (TYLENOL) tablet 650 mg  650 mg Oral Q4H PRN Donzetta Starch, NP       Or   acetaminophen (TYLENOL) solution 650 mg  650 mg Per Tube Q4H PRN Donzetta Starch, NP   650 mg at 08/01/19 0141   Or   acetaminophen (TYLENOL) suppository 650 mg  650 mg Rectal Q4H PRN Donzetta Starch, NP   650 mg at 07/28/19 1710   Ampicillin-Sulbactam (UNASYN) 3 g in sodium chloride 0.9 % 100 mL IVPB  3 g Intravenous Q6H Rolla Flatten, RPH 200 mL/hr at 08/03/19 0916 3 g at 08/03/19 0916   chlorhexidine (PERIDEX) 0.12 % solution 15 mL  15 mL Mouth Rinse BID Garvin Fila, MD   15 mL at 08/03/19 0914   Chlorhexidine Gluconate Cloth 2 % PADS 6 each  6 each Topical Daily Garvin Fila, MD   6 each at 08/03/19 1008   famotidine (PEPCID) 40 MG/5ML suspension 20 mg  20 mg Per Tube BID Burnetta Sabin L, NP   20 mg at 08/03/19 0914   feeding supplement (OSMOLITE 1.5 CAL) liquid 1,000 mL  1,000 mL Per Tube Continuous Rosalin Hawking, MD 60 mL/hr at 08/03/19 1126 1,000 mL at 08/03/19 1126   feeding supplement (PRO-STAT SUGAR FREE 64) liquid 30 mL  30 mL Per Tube TID Rosalin Hawking, MD       free water 200 mL  200 mL Per Tube Q8H Rosalin Hawking, MD  200 mL at 08/03/19 1413   heparin ADULT infusion 100 units/mL (25000 units/216mL sodium chloride 0.45%)  1,250 Units/hr Intravenous Continuous Erenest Blank, RPH 11.5 mL/hr at 08/02/19 2037 1,150 Units/hr at 08/02/19 2037   labetalol (NORMODYNE) injection 5-20 mg  5-20 mg Intravenous Q2H PRN Donzetta Starch, NP       levETIRAcetam (KEPPRA) 100 MG/ML solution 500 mg  500 mg Per Tube BID Donzetta Starch, NP   500 mg at 08/03/19 0915   MEDLINE mouth rinse  15 mL Mouth Rinse q12n4p Garvin Fila, MD   15 mL at 08/03/19 1226   metoprolol tartrate (LOPRESSOR) tablet 50 mg  50 mg Oral Q8H Rosalin Hawking, MD   50 mg at 08/03/19 1408   senna-docusate (Senokot-S) tablet 1 tablet  1 tablet Oral QHS PRN Donzetta Starch, NP       sodium chloride flush (NS) 0.9 % injection 10-40 mL  10-40 mL Intracatheter Q12H Biby, Sharon L, NP   10 mL at 08/03/19 0915   sodium chloride flush (NS) 0.9 % injection 10-40 mL  10-40 mL Intracatheter PRN Donzetta Starch, NP   10 mL at 08/02/19 2213      Allergies  Allergen Reactions   Hydrocodone Nausea Only  :  Family History  Problem Relation Age of Onset   Diabetes Other        GF   Healthy Mother    Healthy Father   :  Social History   Socioeconomic History   Marital status: Married    Spouse name: Not on file   Number of children: 2   Years of education: Not on file    Highest education level: Not on file  Occupational History   Occupation: Sport and exercise psychologist strain: Not on file   Food insecurity    Worry: Not on file    Inability: Not on file   Transportation needs    Medical: Not on file    Non-medical: Not on file  Tobacco Use   Smoking status: Former Smoker    Packs/day: 0.00    Quit date: 11/24/2015    Years since quitting: 3.6   Smokeless tobacco: Never Used   Tobacco comment:    Substance and Sexual Activity   Alcohol use: Yes    Comment: occasionally   Drug use: Never   Sexual activity: Not on file  Lifestyle   Physical activity    Days per week: Not on file    Minutes per session: Not on file   Stress: Not on file  Relationships   Social connections    Talks on phone: Not on file    Gets together: Not on file    Attends religious service: Not on file    Active member of club or organization: Not on file    Attends meetings of clubs or organizations: Not on file    Relationship status: Not on file   Intimate partner violence    Fear of current or ex partner: Not on file    Emotionally abused: Not on file    Physically abused: Not on file    Forced sexual activity: Not on file  Other Topics Concern   Not on file  Social History Narrative   Household-- pt, wife, 2 children  :  REVIEW OF SYSTEMS:  The rest of the 14-point review of systems was negative.   Exam: Patient Vitals for the past 24 hrs:  BP Temp Temp src Pulse Resp SpO2  08/03/19 1400 (!) 123/91 -- -- (!) 104 (!) 21 100 %  08/03/19 1310 119/81 -- -- (!) 109 (!) 22 98 %  08/03/19 1200 125/80 99.2 F (37.3 C) Axillary (!) 106 14 100 %  08/03/19 1100 116/76 -- -- (!) 104 19 100 %  08/03/19 1000 122/80 -- -- 99 (!) 22 100 %  08/03/19 0900 (!) 128/91 -- -- (!) 114 (!) 21 99 %  08/03/19 0800 123/85 (!) 100.8 F (38.2 C) -- (!) 118 (!) 22 100 %  08/03/19 0700 116/83 -- -- (!) 115 19 100 %  08/03/19 0600 120/86  -- -- (!) 115 (!) 23 99 %  08/03/19 0500 119/78 -- -- (!) 110 (!) 21 98 %  08/03/19 0400 125/76 (!) 100.6 F (38.1 C) Axillary (!) 113 (!) 25 100 %  08/03/19 0300 126/77 -- -- 100 17 100 %  08/03/19 0200 125/79 -- -- (!) 107 (!) 24 100 %  08/03/19 0100 137/89 -- -- (!) 110 (!) 25 100 %  08/03/19 0000 119/78 99.6 F (37.6 C) Axillary (!) 103 (!) 27 98 %  08/02/19 2321 129/83 -- -- -- (!) 23 --  08/02/19 2200 137/85 -- -- -- -- --  08/02/19 1854 (!) 141/82 99.2 F (37.3 C) Oral (!) 118 -- 100 %  08/02/19 1700 131/76 100 F (37.8 C) Axillary (!) 115 -- 100 %    General:  well-nourished in no acute distress.   Eyes:  no scleral icterus.   Head: Staples on his head. ENT:  There were no oropharyngeal lesions.   Neck was without thyromegaly.   Lymphatics:  Negative cervical, supraclavicular or axillary adenopathy.   Respiratory: lungs were clear bilaterally without wheezing or crackles.   Cardiovascular:  Regular rate and rhythm, S1/S2, without murmur, rub or gallop.  GI:  abdomen was soft, flat, nontender, nondistended, without organomegaly.   Skin exam was without ecchymosis, petechiae.   Neuro exam: Somnolent.  Opens eyes.  Left hemiparesis.  PERRL.  No facial asymmetry.  LABS:  Lab Results  Component Value Date   WBC 16.4 (H) 08/03/2019   HGB 11.2 (L) 08/03/2019   HCT 34.3 (L) 08/03/2019   PLT 185 08/03/2019   GLUCOSE 138 (H) 08/03/2019   CHOL 140 07/20/2019   TRIG 151 (H) 07/23/2019   HDL 40 (L) 07/20/2019   LDLCALC 83 07/20/2019   ALT 116 (H) 08/02/2019   AST 63 (H) 08/02/2019   NA 136 08/03/2019   K 4.2 08/03/2019   CL 104 08/03/2019   CREATININE 0.95 08/03/2019   BUN 24 (H) 08/03/2019   CO2 22 08/03/2019   HGBA1C 5.4 07/20/2019    Ct Angio Head W Or Wo Contrast  Result Date: 07/21/2019 CLINICAL DATA:  Follow-up stroke EXAM: CT ANGIOGRAPHY HEAD AND NECK TECHNIQUE: Multidetector CT imaging of the head and neck was performed using the standard protocol during  bolus administration of intravenous contrast. Multiplanar CT image reconstructions and MIPs were obtained to evaluate the vascular anatomy. Carotid stenosis measurements (when applicable) are obtained utilizing NASCET criteria, using the distal internal carotid diameter as the denominator. CONTRAST:  17mL OMNIPAQUE IOHEXOL 350 MG/ML SOLN COMPARISON:  Head CT from yesterday FINDINGS: CT HEAD FINDINGS Brain: Acute infarct in the right MCA and ACA territories with petechial hemorrhage at the basal ganglia. Swelling causes brain herniation through the craniectomy defect. 5 mm of midline shift. Vascular: Hyperdense right MCA and proximal ACA. Skull: Unremarkable right-sided craniotomy.  Sinuses: Negative Orbits: Negative Review of the MIP images confirms the above findings CTA NECK FINDINGS Aortic arch: Normal where covered Right carotid system: Proximal right ICA occlusion. The right occipital artery appears to arise from the carotid bulb. No proximal occlusion or embolic source is seen. No atheromatous changes. Left carotid system: Vessels are smooth and widely patent Vertebral arteries: Vessels are smooth and widely patent Skeleton: Negative Other neck: Unremarkable hardware positioning Upper chest: Negative Review of the MIP images confirms the above findings CTA HEAD FINDINGS Anterior circulation: Right ICA occlusion in the neck continues into the siphon and into the right MCA branches and right ACA territory. No left-sided embolus or stenosis noted. Suspect a small persistent trigeminal artery on the left. Posterior circulation: Vertebral and basilar arteries are smooth and diffusely patent. Negative for branch occlusion or stenosis. Venous sinuses: Patent Anatomic variants: As above Review of the MIP images confirms the above findings IMPRESSION: 1. Stable from prior MRA. 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 No evidence of atherosclerosis or vasculopathy in the  other vessels. 2. Cytotoxic edema causes 5 mm of midline shift and brain bulging through the craniectomy defect. Mild petechial hemorrhage is seen at the basal ganglia. Electronically Signed   By: Monte Fantasia M.D.   On: 07/21/2019 07:18   Dg Chest 2 View  Result Date: 07/28/2019 CLINICAL DATA:  Fever x1 day EXAM: CHEST - 2 VIEW COMPARISON:  07/23/2019, 07/21/2019, 07/20/2019 FINDINGS: Endotracheal tube has been removed. Esophageal tube tip is below the diaphragm but non included. Right upper extremity catheter tip poorly visible, appears to be positioned over the right atrium. Low lung volumes. Subsegmental atelectasis left base. Normal heart size. No pneumothorax. IMPRESSION: 1. Removal of endotracheal tube and temperature probe. New esophageal tube with tip below the diaphragm but non included 2. Low lung volumes with improved aeration at the right base. Mild subsegmental atelectasis left base. Electronically Signed   By: Donavan Foil M.D.   On: 07/28/2019 17:43   Dg Chest 2 View  Result Date: 07/19/2019 CLINICAL DATA:  Found on floor. EXAM: CHEST - 2 VIEW COMPARISON:  09/21/2014 FINDINGS: Heart and mediastinal contours are within normal limits. No focal opacities or effusions. No acute bony abnormality. IMPRESSION: No active cardiopulmonary disease. Electronically Signed   By: Rolm Baptise M.D.   On: 07/19/2019 15:50   Ct Head Wo Contrast  Result Date: 08/01/2019 CLINICAL DATA:  Stroke follow-up.  History of craniotomy. EXAM: CT HEAD WITHOUT CONTRAST TECHNIQUE: Contiguous axial images were obtained from the base of the skull through the vertex without intravenous contrast. COMPARISON:  07/30/2019 FINDINGS: Brain: Again noted is a right craniotomy with infarcted brain tissue herniating through the craniotomy site. Large infarct involving the right MCA and ACA territories. The degree of mass effect is similar to the recent comparison examination. There is a cavum septum pellucidum. No significant  midline shift. The parenchymal hemorrhage along the right anterior cortex has minimally changed and measures 2.5 x 2.1 x 3.0 cm and previously measured 2.5 x 2.3 x 2.7 cm. Again noted is a small focus of gas just anterior to the hemorrhage. No significant enlargement of the lateral ventricles. In addition, there are hyperdense foci in the right basal ganglia region which probably represent petechial hemorrhages but minimally changed from the previous examination. Vascular: No hyperdense vessel or unexpected calcification. Skull: Again noted is a large right craniotomy defect. Sinuses/Orbits: Paranasal sinuses are aerated with  minimal mucosal thickening. There is evidence for a feeding tube in the left nostril. Other: None. IMPRESSION: 1. Stable head CT since 07/30/2019. 2. Again noted is a large infarct involving the right MCA and right ACA territories with brain tissue herniating through the right craniotomy defect. 3. Parenchymal hemorrhage in the right anterior cortex has minimally changed. Stable petechial hemorrhage in the right basal ganglia region. Electronically Signed   By: Markus Daft M.D.   On: 08/01/2019 09:31   Ct Head Wo Contrast  Result Date: 07/30/2019 CLINICAL DATA:  Stroke, follow-up.  Right craniectomy. EXAM: CT HEAD WITHOUT CONTRAST TECHNIQUE: Contiguous axial images were obtained from the base of the skull through the vertex without intravenous contrast. COMPARISON:  CT head without contrast 07/23/2019 and 07/21/2019. FINDINGS: Brain: Large right MCA and ACA territory infarct is again noted. There is decreased mass effect. There is decrease in effacement of the right lateral ventricle. Midline shift is no longer present. Cavum septum pellucidum is again noted. There is still infarcted brain herniating through the craniectomy site. A new parenchymal hemorrhage measures 2.5 x 2.3 x 2.7 cm anteriorly within the infarcted right frontal lobe. There is no extension of the infarct. The right PCA  territory is preserved, including most of the right thalamus. The left hemisphere is unremarkable. White matter changes extend into the right cerebral peduncle. Brainstem and cerebellum are otherwise normal. Vascular: No hyperdense vessel or unexpected calcification. Skull: Right craniectomy is again noted. Calvarium is otherwise unremarkable. Sinuses/Orbits: The paranasal sinuses and mastoid air cells are clear. The globes and orbits are within normal limits. IMPRESSION: 1. Decreasing mass effect within large right anterior MCA and ACA territory infarct. 2. Midline shift is no longer present. There is decreased effacement of the right lateral ventricle. 3. Infarcted brain tissue still herniates through the craniectomy site. 4. New parenchymal hemorrhage within the infarcted tissue anteriorly measures 2.5 x 2.3 x 2.7 cm. 5. No new infarct. Electronically Signed   By: San Morelle M.D.   On: 07/30/2019 22:02   Ct Head Wo Contrast  Result Date: 07/23/2019 CLINICAL DATA:  Stroke follow-up EXAM: CT HEAD WITHOUT CONTRAST TECHNIQUE: Contiguous axial images were obtained from the base of the skull through the vertex without intravenous contrast. COMPARISON:  07/21/2019 FINDINGS: Brain: Redemonstration of massive right MCA territory and ACA territory infarcts. Hyperdense focus adjacent to the right caudate head may be a small focus of acute hemorrhage, measuring 4 mm (series 5, image 35). Brain remains herniated through the craniectomy defect. Leftward midline shift measures 2 mm. No hydrocephalus or ventricular entrapment. Vascular: Right MCA remains hyperdense. Right A1 segment is also hyperdense. Skull: Status post decompressive right-sided craniectomy. Sinuses/Orbits: No fluid levels or advanced mucosal thickening of the visualized paranasal sinuses. No mastoid or middle ear effusion. The orbits are normal. IMPRESSION: 1. Unchanged appearance of massive right MCA and ACA territory infarcts with parenchyma  extending through decompressive craniectomy. 2. Small focus of suspected hemorrhage adjacent to the right caudate head. Electronically Signed   By: Ulyses Jarred M.D.   On: 07/23/2019 03:31   Ct Head Wo Contrast  Result Date: 07/20/2019 CLINICAL DATA:  Stroke follow-up EXAM: CT HEAD WITHOUT CONTRAST TECHNIQUE: Contiguous axial images were obtained from the base of the skull through the vertex without intravenous contrast. COMPARISON:  Yesterday FINDINGS: Brain: Acute right ACA and MCA territory infarcts with cytotoxic edema. Interval decompressive craniectomy with bulging of the swollen brain through the defect. No midline shift, entrapment, or hemorrhage. Vascular: High-density vessels  on the right attributed to adjacent edema and/or thrombosis. Skull: Right craniectomy which is unremarkable. Sinuses/Orbits: Negative IMPRESSION: 1. Acute right ACA and MCA territory infarct with progressive swelling that has decompressed through the craniectomy defect. 2. No acute hemorrhage or new infarction. Electronically Signed   By: Monte Fantasia M.D.   On: 07/20/2019 07:09   Ct Head Wo Contrast  Result Date: 07/19/2019 CLINICAL DATA:  Follow-up stroke hemiparesis and aphasia. EXAM: CT HEAD WITHOUT CONTRAST TECHNIQUE: Contiguous axial images were obtained from the base of the skull through the vertex without intravenous contrast. COMPARISON:  CT and MRI studies same day FINDINGS: Brain: Progressive low-density and swelling within the knee complete right MCA and ACA territory affecting the right basal ganglia, frontal lobe, anterior temporal lobe and parietal lobe. Sparing only of the portion of the supratentorial brain supplied by the right PCA. Brain swelling but no hemorrhagic transformation. Mass effect with right-to-left shift of 5-6 mm. Hyperdense carotid terminus and right ACA MCA consistent with embolic occlusion. No extra-axial collection. No hydrocephalus. Vascular: Hyperdense right carotid terminus and right  ACA MCA. Skull: Normal Sinuses/Orbits: Negative Other: None IMPRESSION: Similar appearance to earlier. Low-density and swelling of the right hemisphere in the ACA and MCA territories with mass effect and right-to-left shift of 5-6 mm. No hemorrhagic transformation at this time. Electronically Signed   By: Nelson Chimes M.D.   On: 07/19/2019 16:01   Ct Head Wo Contrast  Result Date: 07/19/2019 CLINICAL DATA:  43 year old male with neurologic deficit greater than 6 hours. Hemiparesis and aphasia. EXAM: CT HEAD WITHOUT CONTRAST CT CERVICAL SPINE WITHOUT CONTRAST TECHNIQUE: Multidetector CT imaging of the head and cervical spine was performed following the standard protocol without intravenous contrast. Multiplanar CT image reconstructions of the cervical spine were also generated. COMPARISON:  Report of neck CT 09/21/2014 (no images available). Report of head CT 07/23/2001 (no images available). FINDINGS: CT HEAD FINDINGS Brain: Confluent cytotoxic edema in the right hemisphere affecting the right ACA and right MCA territories. Mass effect on the right lateral ventricle and trace leftward midline shift (series 3, image 21). The right PCA territory appears mostly spared. There is right deep gray nuclei involvement. The left hemisphere and posterior fossa gray-white matter differentiation is preserved. No ventriculomegaly. Basilar cisterns remain patent. No acute intracranial hemorrhage identified. Vascular: Asymmetric hyperdensity at the right ICA terminus and involving the right A1, right M1 segments and also some right MCA branches (series 5, image 32). Skull: Negative. Sinuses/Orbits: Mild mucosal thickening in the left maxillary sinus. Other paranasal sinuses, tympanic cavities and mastoids are clear. Other: No acute orbit or scalp soft tissue finding. CT CERVICAL SPINE FINDINGS Alignment: Straightening of cervical lordosis. Cervicothoracic junction alignment is within normal limits. Bilateral posterior element  alignment is within normal limits. Skull base and vertebrae: Visualized skull base is intact. No atlanto-occipital dissociation. No acute osseous abnormality identified. Soft tissues and spinal canal: No prevertebral fluid or swelling. No visible canal hematoma. Negative noncontrast neck soft tissues. Disc levels: Mild cervical spine degeneration. Foraminal disc and endplate spurring most pronounced at C3-C4. Upper chest: Visible upper thoracic levels and left lung apex appear negative. IMPRESSION: 1. 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 2. No associated hemorrhage and mild intracranial mass effect at this time, including trace leftward midline shift. 3. Evidence of large vessel occlusion: Hyperdensity of the right ICA terminus, the right A1 and MCA. 4. Unaffected brain parenchyma appears negative. 5.  No acute  traumatic injury identified in the cervical spine. Critical Value/emergent results were called by telephone at the time of interpretation on 07/19/2019 at 11:45 am to Dr. Virgel Manifold , who verbally acknowledged these results. Electronically Signed   By: Genevie Ann M.D.   On: 07/19/2019 11:46   Ct Angio Neck W Or Wo Contrast  Result Date: 07/21/2019 CLINICAL DATA:  Follow-up stroke EXAM: CT ANGIOGRAPHY HEAD AND NECK TECHNIQUE: Multidetector CT imaging of the head and neck was performed using the standard protocol during bolus administration of intravenous contrast. Multiplanar CT image reconstructions and MIPs were obtained to evaluate the vascular anatomy. Carotid stenosis measurements (when applicable) are obtained utilizing NASCET criteria, using the distal internal carotid diameter as the denominator. CONTRAST:  69mL OMNIPAQUE IOHEXOL 350 MG/ML SOLN COMPARISON:  Head CT from yesterday FINDINGS: CT HEAD FINDINGS Brain: Acute infarct in the right MCA and ACA territories with petechial hemorrhage at the basal ganglia. Swelling causes brain  herniation through the craniectomy defect. 5 mm of midline shift. Vascular: Hyperdense right MCA and proximal ACA. Skull: Unremarkable right-sided craniotomy. Sinuses: Negative Orbits: Negative Review of the MIP images confirms the above findings CTA NECK FINDINGS Aortic arch: Normal where covered Right carotid system: Proximal right ICA occlusion. The right occipital artery appears to arise from the carotid bulb. No proximal occlusion or embolic source is seen. No atheromatous changes. Left carotid system: Vessels are smooth and widely patent Vertebral arteries: Vessels are smooth and widely patent Skeleton: Negative Other neck: Unremarkable hardware positioning Upper chest: Negative Review of the MIP images confirms the above findings CTA HEAD FINDINGS Anterior circulation: Right ICA occlusion in the neck continues into the siphon and into the right MCA branches and right ACA territory. No left-sided embolus or stenosis noted. Suspect a small persistent trigeminal artery on the left. Posterior circulation: Vertebral and basilar arteries are smooth and diffusely patent. Negative for branch occlusion or stenosis. Venous sinuses: Patent Anatomic variants: As above Review of the MIP images confirms the above findings IMPRESSION: 1. Stable from prior MRA. 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 No evidence of atherosclerosis or vasculopathy in the other vessels. 2. Cytotoxic edema causes 5 mm of midline shift and brain bulging through the craniectomy defect. Mild petechial hemorrhage is seen at the basal ganglia. Electronically Signed   By: Monte Fantasia M.D.   On: 07/21/2019 07:18   Ct Angio Chest Pe W Or Wo Contrast  Result Date: 08/01/2019 CLINICAL DATA:  Positive for bilateral DVT EXAM: CT ANGIOGRAPHY CHEST WITH CONTRAST TECHNIQUE: Multidetector CT imaging of the chest was performed using the standard protocol during bolus administration of intravenous  contrast. Multiplanar CT image reconstructions and MIPs were obtained to evaluate the vascular anatomy. CONTRAST:  44mL OMNIPAQUE IOHEXOL 350 MG/ML SOLN COMPARISON:  None. FINDINGS: Cardiovascular: There is a optimal opacification of the pulmonary arteries. There is nonocclusive thrombus seen in the posterior left lower lobe segmental artery, series 5, image 66. The heart is normal in size. No pericardial effusion thickening. No evidence right heart strain. There is normal three-vessel brachiocephalic anatomy without proximal stenosis. The thoracic aorta is normal in appearance. A right-sided PICC is seen with the tip in the right atrium. The. Mediastinum/Nodes: No hilar, mediastinal, or axillary adenopathy. Thyroid gland, trachea, and esophagus demonstrate no significant findings. Lungs/Pleura: Small amount bibasilar dependent atelectasis is seen. Upper Abdomen: No acute abnormalities present in the visualized portions of the upper abdomen. NG tube is  seen within the stomach. Musculoskeletal: No chest wall abnormality. No acute or significant osseous findings. Review of the MIP images confirms the above findings. IMPRESSION: Nonocclusive thrombus seen within the left posterior lower lobe segmental artery. These results will be called to the ordering clinician or representative by the Radiologist Assistant, and communication documented in the PACS or zVision Dashboard. Electronically Signed   By: Prudencio Pair M.D.   On: 08/01/2019 21:45   Ct Cervical Spine Wo Contrast  Result Date: 07/19/2019 CLINICAL DATA:  43 year old male with neurologic deficit greater than 6 hours. Hemiparesis and aphasia. EXAM: CT HEAD WITHOUT CONTRAST CT CERVICAL SPINE WITHOUT CONTRAST TECHNIQUE: Multidetector CT imaging of the head and cervical spine was performed following the standard protocol without intravenous contrast. Multiplanar CT image reconstructions of the cervical spine were also generated. COMPARISON:  Report of neck CT  09/21/2014 (no images available). Report of head CT 07/23/2001 (no images available). FINDINGS: CT HEAD FINDINGS Brain: Confluent cytotoxic edema in the right hemisphere affecting the right ACA and right MCA territories. Mass effect on the right lateral ventricle and trace leftward midline shift (series 3, image 21). The right PCA territory appears mostly spared. There is right deep gray nuclei involvement. The left hemisphere and posterior fossa gray-white matter differentiation is preserved. No ventriculomegaly. Basilar cisterns remain patent. No acute intracranial hemorrhage identified. Vascular: Asymmetric hyperdensity at the right ICA terminus and involving the right A1, right M1 segments and also some right MCA branches (series 5, image 32). Skull: Negative. Sinuses/Orbits: Mild mucosal thickening in the left maxillary sinus. Other paranasal sinuses, tympanic cavities and mastoids are clear. Other: No acute orbit or scalp soft tissue finding. CT CERVICAL SPINE FINDINGS Alignment: Straightening of cervical lordosis. Cervicothoracic junction alignment is within normal limits. Bilateral posterior element alignment is within normal limits. Skull base and vertebrae: Visualized skull base is intact. No atlanto-occipital dissociation. No acute osseous abnormality identified. Soft tissues and spinal canal: No prevertebral fluid or swelling. No visible canal hematoma. Negative noncontrast neck soft tissues. Disc levels: Mild cervical spine degeneration. Foraminal disc and endplate spurring most pronounced at C3-C4. Upper chest: Visible upper thoracic levels and left lung apex appear negative. IMPRESSION: 1. 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 2. No associated hemorrhage and mild intracranial mass effect at this time, including trace leftward midline shift. 3. Evidence of large vessel occlusion: Hyperdensity of the right ICA terminus, the right A1 and MCA.  4. Unaffected brain parenchyma appears negative. 5.  No acute traumatic injury identified in the cervical spine. Critical Value/emergent results were called by telephone at the time of interpretation on 07/19/2019 at 11:45 am to Dr. Virgel Manifold , who verbally acknowledged these results. Electronically Signed   By: Genevie Ann M.D.   On: 07/19/2019 11:46   Mr Angio Head Wo Contrast  Result Date: 07/19/2019 CLINICAL DATA:  43 year old male found down this morning by coworker. 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 EXAM: MRI HEAD WITHOUT CONTRAST MRA HEAD WITHOUT CONTRAST MRA NECK WITHOUT CONTRAST TECHNIQUE: Multiplanar, multiecho pulse sequences of the brain and surrounding structures were obtained without intravenous contrast. Angiographic images of the Circle of Willis were obtained using MRA technique without intravenous contrast. Angiographic images of the neck were obtained using MRA technique without intravenous contrast. Carotid stenosis measurements (when applicable) are obtained utilizing NASCET criteria, using the distal internal carotid diameter  as the denominator. COMPARISON:  Plain head CT 1128 hours today. FINDINGS: MRI HEAD FINDINGS Brain: Confluent restricted diffusion throughout the right hemisphere sparing most of the right PCA territory; there are few punctate areas of restricted diffusion in the right occipital pole (series 5, image 70). The right basal ganglia and internal capsule are affected. The right thalamus is spared. No left hemisphere or posterior fossa restricted diffusion. Associated T2 and FLAIR hyperintensity reflecting cytotoxic edema. No convincing hemorrhage; mild asymmetry of susceptibility weighted images felt related to some right hemisphere vascular engorgement. Trace leftward midline shift and mild mass effect on the right lateral ventricle. Incidental cavum septum pellucidum. Minimal  mass effect on the suprasellar cistern. Other basilar cisterns are patent. No ventriculomegaly. Outside of the affected territory gray and white matter signal is normal. Negative pituitary. Vascular: The right ICA flow void is lost in the neck and through the siphon. The other Major intracranial vascular flow voids are preserved. See below. Skull and upper cervical spine: Negative visible cervical spine. Normal bone marrow signal. Sinuses/Orbits: Negative orbits. Trace paranasal sinus mucosal thickening. Other: Mastoids are clear. Scalp and face soft tissues appear negative. MRA NECK FINDINGS Time-of-flight images in the neck demonstrate antegrade flow signal in both common carotid arteries and both cervical vertebral arteries. From the level of the right carotid bifurcation there is absent flow signal in the right ICA. Preserved flow signal in the right ECA. The left carotid bifurcation and left ICA appear normal. Antegrade flow continues in the vertebral arteries to the skull base. MRA HEAD FINDINGS Antegrade flow in the posterior circulation with codominant distal vertebral arteries. Patent vertebrobasilar junction. Patent basilar artery. Motion artifact at the distal basilar which seems to remain patent. The basilar tip and PCA origins appear to remain patent, but detail is obscured. Above the level of artifact there is flow signal detected in both PCAs, greater on the right (likely due to luxury perfusion on that side). At the skull base there is antegrade flow in the left ICA siphon, but no right ICA flow signal. Motion artifact beginning at the cavernous segment, but antegrade flow continues on the left to the level of the left ICA terminus. The left MCA and ACA origin are patent. Visible left MCA and ACA branches appear within normal limits. There is no flow signal at the right ICA terminus, right ACA or right MCA. IMPRESSION: 1. 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 The right ICA terminus, right ACA  and MCA also appear occluded. 2. 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 Cytotoxic edema but no convincing hemorrhage. Stable mild mass effect with trace leftward midline shift. 3. Intracranial MRA is degraded by motion artifact, but no other large vessel occlusion is suspected. There appears to be lobes reperfusion of the right PCA. 4. The left hemisphere and posterior fossa brain parenchyma appears normal. Electronically Signed   By: Genevie Ann M.D.   On: 07/19/2019 14:00   Mr Angio Neck Wo Contrast  Result Date: 07/19/2019 CLINICAL DATA:  43 year old male found down this morning by coworker. 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 EXAM: MRI HEAD WITHOUT CONTRAST MRA HEAD WITHOUT CONTRAST MRA NECK WITHOUT CONTRAST TECHNIQUE: Multiplanar, multiecho pulse sequences of the brain and surrounding structures were obtained without intravenous contrast. Angiographic images of the Circle of Willis were obtained using MRA technique without intravenous contrast. Angiographic images of the neck  were obtained using MRA technique without intravenous contrast. Carotid stenosis measurements (when applicable) are obtained utilizing NASCET criteria, using the distal internal carotid diameter as the denominator. COMPARISON:  Plain head CT 1128 hours today. FINDINGS: MRI HEAD FINDINGS Brain: Confluent restricted diffusion throughout the right hemisphere sparing most of the right PCA territory; there are few punctate areas of restricted diffusion in the right occipital pole (series 5, image 70). The right basal ganglia and internal capsule are affected. The right thalamus is spared. No left hemisphere or posterior fossa restricted diffusion. Associated T2 and FLAIR hyperintensity reflecting cytotoxic edema. No convincing hemorrhage; mild asymmetry of susceptibility weighted images felt related to some right  hemisphere vascular engorgement. Trace leftward midline shift and mild mass effect on the right lateral ventricle. Incidental cavum septum pellucidum. Minimal mass effect on the suprasellar cistern. Other basilar cisterns are patent. No ventriculomegaly. Outside of the affected territory gray and white matter signal is normal. Negative pituitary. Vascular: The right ICA flow void is lost in the neck and through the siphon. The other Major intracranial vascular flow voids are preserved. See below. Skull and upper cervical spine: Negative visible cervical spine. Normal bone marrow signal. Sinuses/Orbits: Negative orbits. Trace paranasal sinus mucosal thickening. Other: Mastoids are clear. Scalp and face soft tissues appear negative. MRA NECK FINDINGS Time-of-flight images in the neck demonstrate antegrade flow signal in both common carotid arteries and both cervical vertebral arteries. From the level of the right carotid bifurcation there is absent flow signal in the right ICA. Preserved flow signal in the right ECA. The left carotid bifurcation and left ICA appear normal. Antegrade flow continues in the vertebral arteries to the skull base. MRA HEAD FINDINGS Antegrade flow in the posterior circulation with codominant distal vertebral arteries. Patent vertebrobasilar junction. Patent basilar artery. Motion artifact at the distal basilar which seems to remain patent. The basilar tip and PCA origins appear to remain patent, but detail is obscured. Above the level of artifact there is flow signal detected in both PCAs, greater on the right (likely due to luxury perfusion on that side). At the skull base there is antegrade flow in the left ICA siphon, but no right ICA flow signal. Motion artifact beginning at the cavernous segment, but antegrade flow continues on the left to the level of the left ICA terminus. The left MCA and ACA origin are patent. Visible left MCA and ACA branches appear within normal limits. There is  no flow signal at the right ICA terminus, right ACA or right MCA. IMPRESSION: 1. 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 The right ICA terminus, right ACA and MCA also appear occluded. 2. 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 Cytotoxic edema but no convincing hemorrhage. Stable mild mass effect with trace leftward midline shift. 3. Intracranial MRA is degraded by motion artifact, but no other large vessel occlusion is suspected. There appears to be lobes reperfusion of the right PCA. 4. The left hemisphere and posterior fossa brain parenchyma appears normal. Electronically Signed   By: Genevie Ann M.D.   On: 07/19/2019 14:00   Mr Brain Wo Contrast  Result Date: 07/19/2019 CLINICAL DATA:  43 year old male found down this morning by coworker. 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 EXAM: MRI HEAD WITHOUT CONTRAST MRA HEAD WITHOUT CONTRAST MRA NECK WITHOUT CONTRAST TECHNIQUE: Multiplanar, multiecho pulse sequences of the brain and surrounding structures were obtained  without intravenous contrast. Angiographic images of the Circle of Willis were obtained using MRA technique without intravenous contrast. Angiographic images of the neck were obtained using MRA technique without intravenous contrast. Carotid stenosis measurements (when applicable) are obtained utilizing NASCET criteria, using the distal internal carotid diameter as the denominator. COMPARISON:  Plain head CT 1128 hours today. FINDINGS: MRI HEAD FINDINGS Brain: Confluent restricted diffusion throughout the right hemisphere sparing most of the right PCA territory; there are few punctate areas of restricted diffusion in the right occipital pole (series 5, image 70). The right basal ganglia and internal capsule are affected. The right thalamus is spared. No left hemisphere or posterior fossa restricted diffusion. Associated T2 and  FLAIR hyperintensity reflecting cytotoxic edema. No convincing hemorrhage; mild asymmetry of susceptibility weighted images felt related to some right hemisphere vascular engorgement. Trace leftward midline shift and mild mass effect on the right lateral ventricle. Incidental cavum septum pellucidum. Minimal mass effect on the suprasellar cistern. Other basilar cisterns are patent. No ventriculomegaly. Outside of the affected territory gray and white matter signal is normal. Negative pituitary. Vascular: The right ICA flow void is lost in the neck and through the siphon. The other Major intracranial vascular flow voids are preserved. See below. Skull and upper cervical spine: Negative visible cervical spine. Normal bone marrow signal. Sinuses/Orbits: Negative orbits. Trace paranasal sinus mucosal thickening. Other: Mastoids are clear. Scalp and face soft tissues appear negative. MRA NECK FINDINGS Time-of-flight images in the neck demonstrate antegrade flow signal in both common carotid arteries and both cervical vertebral arteries. From the level of the right carotid bifurcation there is absent flow signal in the right ICA. Preserved flow signal in the right ECA. The left carotid bifurcation and left ICA appear normal. Antegrade flow continues in the vertebral arteries to the skull base. MRA HEAD FINDINGS Antegrade flow in the posterior circulation with codominant distal vertebral arteries. Patent vertebrobasilar junction. Patent basilar artery. Motion artifact at the distal basilar which seems to remain patent. The basilar tip and PCA origins appear to remain patent, but detail is obscured. Above the level of artifact there is flow signal detected in both PCAs, greater on the right (likely due to luxury perfusion on that side). At the skull base there is antegrade flow in the left ICA siphon, but no right ICA flow signal. Motion artifact beginning at the cavernous segment, but antegrade flow continues on the left  to the level of the left ICA terminus. The left MCA and ACA origin are patent. Visible left MCA and ACA branches appear within normal limits. There is no flow signal at the right ICA terminus, right ACA or right MCA. IMPRESSION: 1. 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 The right ICA terminus, right ACA and MCA also appear occluded. 2. 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 Cytotoxic edema but no convincing hemorrhage. Stable mild mass effect with trace leftward midline shift. 3. Intracranial MRA is degraded by motion artifact, but no other large vessel occlusion is suspected. There appears to be lobes reperfusion of the right PCA. 4. The left hemisphere and posterior fossa brain parenchyma appears normal. Electronically Signed   By: Genevie Ann M.D.   On: 07/19/2019 14:00   Ir Venogram Renal Uni Right  Result Date: 07/21/2019 INDICATION: 43 year old with CVA and craniotomy. Patient has bilateral lower extremity DVTs. Patient is not a candidate for anticoagulation at this time. Request for IVC filter placement. EXAM: IVC FILTER PLACEMENT; IVC VENOGRAM; RIGHT RENAL  VEIN VENOGRAPHY; ULTRASOUND FOR VASCULAR ACCESS Physician: Stephan Minister. Anselm Pancoast, MD MEDICATIONS: None. ANESTHESIA/SEDATION: Patient was intubated and sedated when he arrived to interventional radiology. CONTRAST:  45 mL Omnipaque 300 FLUOROSCOPY TIME:  Fluoroscopy Time: 4 minutes, 123456 mGy COMPLICATIONS: None immediate. PROCEDURE: The procedure was explained to the patient's wife. The risks and benefits of the procedure were discussed and the patient's questions were addressed. Informed consent was obtained from the patient's wife. Ultrasound demonstrated a patent right internal jugular vein. Ultrasound images were obtained for documentation. The right side of the neck was prepped and draped in a sterile fashion. Maximal barrier sterile technique was utilized including caps, mask, sterile gowns, sterile gloves, sterile drape,  hand hygiene and skin antiseptic. The skin was anesthetized with 1% lidocaine. A 21 gauge needle was directed into the vein with ultrasound guidance and a micropuncture dilator set was placed. A wire was advanced into the IVC. The filter sheath was advanced over an Amplatz wire into the IVC. An IVC venogram was performed. Right renal vein was not identified with IVC venogram. Therefore, a 5 French catheter was used to cannulate the right renal vein and angiogram was performed within the right renal vein to confirm placement. Left renal vein was cannulated with a wire to confirm location. Fluoroscopic images were obtained for documentation. A Bard Denali filter was deployed below the lowest renal vein. A follow-up venogram was performed and the vascular sheath was removed with manual compression. FINDINGS: IVC was patent. Bilateral renal veins were identified. IVC measures 28 mm or less below the renal veins. The filter was deployed below the renal veins. Follow-up venogram confirmed placement within the IVC and below the renal veins. IMPRESSION: Successful placement of a retrievable IVC filter. PLAN: This IVC filter is potentially retrievable. The patient will be assessed for filter retrieval by Interventional Radiology in approximately 8-12 weeks. Further recommendations regarding filter retrieval, continued surveillance or declaration of device permanence, will be made at that time. Electronically Signed   By: Markus Daft M.D.   On: 07/21/2019 18:49   Ir Ivc Filter Plmt / S&i /img Guid/mod Sed  Result Date: 07/21/2019 INDICATION: 43 year old with CVA and craniotomy. Patient has bilateral lower extremity DVTs. Patient is not a candidate for anticoagulation at this time. Request for IVC filter placement. EXAM: IVC FILTER PLACEMENT; IVC VENOGRAM; RIGHT RENAL VEIN VENOGRAPHY; ULTRASOUND FOR VASCULAR ACCESS Physician: Stephan Minister. Anselm Pancoast, MD MEDICATIONS: None. ANESTHESIA/SEDATION: Patient was intubated and sedated when he  arrived to interventional radiology. CONTRAST:  45 mL Omnipaque 300 FLUOROSCOPY TIME:  Fluoroscopy Time: 4 minutes, 123456 mGy COMPLICATIONS: None immediate. PROCEDURE: The procedure was explained to the patient's wife. The risks and benefits of the procedure were discussed and the patient's questions were addressed. Informed consent was obtained from the patient's wife. Ultrasound demonstrated a patent right internal jugular vein. Ultrasound images were obtained for documentation. The right side of the neck was prepped and draped in a sterile fashion. Maximal barrier sterile technique was utilized including caps, mask, sterile gowns, sterile gloves, sterile drape, hand hygiene and skin antiseptic. The skin was anesthetized with 1% lidocaine. A 21 gauge needle was directed into the vein with ultrasound guidance and a micropuncture dilator set was placed. A wire was advanced into the IVC. The filter sheath was advanced over an Amplatz wire into the IVC. An IVC venogram was performed. Right renal vein was not identified with IVC venogram. Therefore, a 5 French catheter was used to cannulate the right renal vein and angiogram was  performed within the right renal vein to confirm placement. Left renal vein was cannulated with a wire to confirm location. Fluoroscopic images were obtained for documentation. A Bard Denali filter was deployed below the lowest renal vein. A follow-up venogram was performed and the vascular sheath was removed with manual compression. FINDINGS: IVC was patent. Bilateral renal veins were identified. IVC measures 28 mm or less below the renal veins. The filter was deployed below the renal veins. Follow-up venogram confirmed placement within the IVC and below the renal veins. IMPRESSION: Successful placement of a retrievable IVC filter. PLAN: This IVC filter is potentially retrievable. The patient will be assessed for filter retrieval by Interventional Radiology in approximately 8-12 weeks. Further  recommendations regarding filter retrieval, continued surveillance or declaration of device permanence, will be made at that time. Electronically Signed   By: Markus Daft M.D.   On: 07/21/2019 18:49   Dg Chest Port 1 View  Result Date: 08/01/2019 CLINICAL DATA:  Fever in patient status post stroke 07/19/2019. EXAM: PORTABLE CHEST 1 VIEW COMPARISON:  Single-view of the chest 07/30/2019. PA and lateral chest 07/28/2019. FINDINGS: Right PICC and feeding tube remain in place. The lungs are clear. Heart size is normal. No pneumothorax or pleural fluid. No bony abnormality. IMPRESSION: No acute disease. Electronically Signed   By: Inge Rise M.D.   On: 08/01/2019 09:19   Dg Chest Port 1 View  Result Date: 07/30/2019 CLINICAL DATA:  fever EXAM: PORTABLE CHEST 1 VIEW COMPARISON:  Chest radiograph 07/28/2019, 07/23/2019 FINDINGS: Stable cardiomediastinal contours. Right upper extremity PICC tip projects over the upper right atrium. An enteric tube courses below the diaphragm with distal aspect out of field of view. Low volume study. No new focal infiltrate identified. No pneumothorax or large pleural effusion. Visualized skeleton is unremarkable. IMPRESSION: 1.  Low volume study without focal infiltrate. 2. Right upper extremity PICC tip projects over the upper right atrium. Electronically Signed   By: Audie Pinto M.D.   On: 07/30/2019 13:25   Dg Chest Port 1 View  Result Date: 07/23/2019 CLINICAL DATA:  Found unconscious on the floor. Right hemisphere stroke. EXAM: PORTABLE CHEST 1 VIEW COMPARISON:  07/21/2019 FINDINGS: Endotracheal tube tip is 5 cm above the carina. Nasogastric or orogastric tube enters the stomach. Right arm PICC tip in the lower right atrium. Worsened atelectasis in the right lower lobe. IMPRESSION: Worsened atelectasis in the right lower lobe. Right arm PICC tip in the lower right atrium. Electronically Signed   By: Nelson Chimes M.D.   On: 07/23/2019 10:36   Dg Chest Port 1  View  Result Date: 07/21/2019 CLINICAL DATA:  New fever EXAM: PORTABLE CHEST 1 VIEW COMPARISON:  07/20/2019 FINDINGS: Temperature probe has been placed over the midesophagus. Otherwise unchanged AP portable examination with endotracheal tube, tip just below the thoracic inlet and esophagogastric tube tip and side port below the diaphragm. No new airspace opacity. Thermal blanket projects over the patient. IMPRESSION: Temperature probe has been placed over the midesophagus. Otherwise unchanged AP portable examination with endotracheal tube, tip just below the thoracic inlet and esophagogastric tube tip and side port below the diaphragm. No new airspace opacity. Thermal blanket projects over the patient. Electronically Signed   By: Eddie Candle M.D.   On: 07/21/2019 10:15   Portable Chest X-ray  Result Date: 07/20/2019 CLINICAL DATA:  43 year old male with intubation. EXAM: PORTABLE CHEST 1 VIEW COMPARISON:  Chest radiograph dated 07/19/2019 FINDINGS: Endotracheal tube with tip approximately 4.5 cm above the carina.  Enteric tube extends into the left upper abdomen likely in the stomach. The lungs are clear. There is no pleural effusion or pneumothorax. The cardiac silhouette is within normal limits. No acute osseous pathology. IMPRESSION: 1. No acute cardiopulmonary process. 2. Endotracheal tube above the carina. Electronically Signed   By: Anner Crete M.D.   On: 07/20/2019 01:56   Dg Abd Portable 1v  Result Date: 07/22/2019 CLINICAL DATA:  Status post OG tube placement. EXAM: PORTABLE ABDOMEN - 1 VIEW COMPARISON:  None. FINDINGS: OG tube is seen with its tip and side-port in the stomach. Bowel gas pattern is normal. Surgical staples right lower quadrant and IVC filter are noted. IMPRESSION: OG tube in good position. Electronically Signed   By: Inge Rise M.D.   On: 07/22/2019 16:54   Vas [place] Transcranial Doppler W Bubbles  Result Date: 07/26/2019  Transcranial Doppler with Bubble Indications:  Stroke. Performing Technologist: [name] RVS  Examination Guidelines: A complete evaluation includes B-mode imaging, spectral Doppler, color Doppler, and power Doppler as needed of all accessible portions of each vessel. Bilateral testing is considered an integral part of a complete examination. Limited examinations for reoccurring indications may be performed as noted.  Summary:  A vascular evaluation was performed. The left Opthalmic Artery was studied. An IV was inserted into the patient's right PICC line. Verbal informed consent was obtained.  No HITS heard heard at rest. No HITS heard heard during valsalva. Negative TCD Bubble study *See table(s) above for measurements and observations.  Diagnosing physician: Antony Contras MD Electronically signed by Antony Contras MD on 07/26/2019 at 1:15:00 PM.    Final    Vas [place] Lower Extremity Venous (dvt)  Result Date: 08/02/2019  Lower Venous Study Indications: Swelling, and follow up DVT. Other Indications: IVC filter placed 07/21/19. Risk Factors: Pulmonary embolism 08/01/19. Anticoagulation: No AC due to brain bleed. Comparison Study: 07/21/19 Right PTV, Pero Left Pop, PTV, Pero Performing Technologist: June Leap RDMS, RVT  Examination Guidelines: A complete evaluation includes B-mode imaging, spectral Doppler, color Doppler, and power Doppler as needed of all accessible portions of each vessel. Bilateral testing is considered an integral part of a complete examination. Limited examinations for reoccurring indications may be performed as noted.  +---------+---------------+---------+-----------+----------+--------------+  RIGHT     Compressibility Phasicity Spontaneity Properties Thrombus Aging  +---------+---------------+---------+-----------+----------+--------------+  CFV       Full            Yes       Yes                                    +---------+---------------+---------+-----------+----------+--------------+  SFJ       Full                                                              +---------+---------------+---------+-----------+----------+--------------+  FV Prox   Full                                                             +---------+---------------+---------+-----------+----------+--------------+  FV Mid    Full                                                             +---------+---------------+---------+-----------+----------+--------------+  FV Distal Full                                                             +---------+---------------+---------+-----------+----------+--------------+  PFV       Full                                                             +---------+---------------+---------+-----------+----------+--------------+  POP       Full            Yes       Yes                                    +---------+---------------+---------+-----------+----------+--------------+  PTV       Full                                                             +---------+---------------+---------+-----------+----------+--------------+  PERO      None                                             mid segment     +---------+---------------+---------+-----------+----------+--------------+   +---------+---------------+---------+-----------+----------+--------------+  LEFT      Compressibility Phasicity Spontaneity Properties Thrombus Aging  +---------+---------------+---------+-----------+----------+--------------+  CFV       None            No        No                                     +---------+---------------+---------+-----------+----------+--------------+  SFJ       None                                                             +---------+---------------+---------+-----------+----------+--------------+  FV Prox   None                                                             +---------+---------------+---------+-----------+----------+--------------+  FV Mid    None                                                              +---------+---------------+---------+-----------+----------+--------------+  FV Distal None                                                             +---------+---------------+---------+-----------+----------+--------------+  PFV       None                                                             +---------+---------------+---------+-----------+----------+--------------+  POP       None            No        No                                     +---------+---------------+---------+-----------+----------+--------------+  PTV       None                                                             +---------+---------------+---------+-----------+----------+--------------+  PERO      None                                                             +---------+---------------+---------+-----------+----------+--------------+  Iliac     None            No        No                                     +---------+---------------+---------+-----------+----------+--------------+ Thrombus of left leg extends up to IVC filter    Summary: Right: Findings consistent with acute deep vein thrombosis involving the right peroneal veins. Left: Findings consistent with acute deep vein thrombosis involving the left common femoral vein, left femoral vein, left proximal profunda vein, left popliteal vein, left posterior tibial veins, and left peroneal veins. Extending up into left iliac vein  and IVC.  *See table(s) above for measurements and observations. Electronically signed by Servando Snare MD on 08/02/2019 at 1:07:45 PM.    Final    Vas [place] Lower Extremity Venous (dvt)  Result Date: 07/21/2019  Lower Venous Study Indications: Stroke.  Comparison Study: No prior study on file for comparison Performing Technologist: Sharion Dove RVS  Examination Guidelines: A  complete evaluation includes B-mode imaging, spectral Doppler, color Doppler, and power Doppler as needed of all accessible portions of each vessel. Bilateral testing is  considered an integral part of a complete examination. Limited examinations for reoccurring indications may be performed as noted.  +---------+---------------+---------+-----------+----------+--------------+  RIGHT     Compressibility Phasicity Spontaneity Properties Thrombus Aging  +---------+---------------+---------+-----------+----------+--------------+  CFV       Full            Yes       Yes                                    +---------+---------------+---------+-----------+----------+--------------+  SFJ       Full                                                             +---------+---------------+---------+-----------+----------+--------------+  FV Prox   Full                                                             +---------+---------------+---------+-----------+----------+--------------+  FV Mid    Full                                                             +---------+---------------+---------+-----------+----------+--------------+  FV Distal Full                                                             +---------+---------------+---------+-----------+----------+--------------+  PFV       Full                                                             +---------+---------------+---------+-----------+----------+--------------+  POP       Full            Yes       Yes                                    +---------+---------------+---------+-----------+----------+--------------+  PTV       Full                                                             +---------+---------------+---------+-----------+----------+--------------+  PERO  None                                             Acute           +---------+---------------+---------+-----------+----------+--------------+   +---------+---------------+---------+-----------+----------+--------------+  LEFT      Compressibility Phasicity Spontaneity Properties Thrombus Aging   +---------+---------------+---------+-----------+----------+--------------+  CFV       Full            Yes       Yes                                    +---------+---------------+---------+-----------+----------+--------------+  SFJ       Full                                                             +---------+---------------+---------+-----------+----------+--------------+  FV Prox   Full                                                             +---------+---------------+---------+-----------+----------+--------------+  FV Mid    Full                                                             +---------+---------------+---------+-----------+----------+--------------+  FV Distal Full                                                             +---------+---------------+---------+-----------+----------+--------------+  PFV       Full                                                             +---------+---------------+---------+-----------+----------+--------------+  POP       Partial         No        Yes                    Acute           +---------+---------------+---------+-----------+----------+--------------+  PTV       None                                             Acute           +---------+---------------+---------+-----------+----------+--------------+  PERO      None                                             Acute           +---------+---------------+---------+-----------+----------+--------------+     Summary: Right: Findings consistent with acute deep vein thrombosis involving the right peroneal veins. Left: Findings consistent with acute deep vein thrombosis involving the left popliteal vein, left posterior tibial veins, and left peroneal veins.  *See table(s) above for measurements and observations. Electronically signed by Monica Martinez MD on 07/21/2019 at 5:16:47 PM.    Final    [place] Ekg Site Rite  Result Date: 07/20/2019 If Site Rite image not attached, placement could not be  confirmed due to current cardiac rhythm.    ASSESSMENT AND PLAN:   1.  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  Bilateral lower extremity DVT, status post IVC filter placement on 07/21/2019 3.  Nonocclusive left lower extremity PE, diagnosed 08/01/2019 4.  Seizure-like activity 5.  Normocytic anemia 6.  Leukocytosis  -Prior hypercoagulable work-up reviewed from 07/21/2019.  Work-up negative.  I do not see that factor V Leiden was sent.  We will need to add this to complete this work-up. -The patient is tolerating IV heparin well so far.  He has not developed any recurrent bleeding.  At this point, the risks of developing recurrent clots remains elevated and would recommend continuing anticoagulation.  We can look at transitioning him over to Lovenox prior to hospital discharge. -Continue Keppra per neurology. -Mild anemia is likely dilutional and due to her repeated lab draws.  Monitor closely. -The patient had a fever and has leukocytosis.  Infection work-up and antibiotics per hospitalist.  Thank you for this referral.  Mikey Bussing, DNP, AGPCNP-BC, AOCNP   ADDENDUM  .Patient was Personally and independently interviewed, examined and relevant elements of the history of present illness were reviewed in details and an assessment and plan was created. All elements of the patient's history of present illness , assessment and plan were discussed in details with Mikey Bussing, DNP. The above documentation reflects our combined findings assessment and plan.   Component     Latest Ref Rng & Units 07/21/2019  PTT Lupus Anticoagulant     0.0 - 51.9 sec 32.1  DRVVT     0.0 - 47.0 sec 48.3 (H)  Lupus Anticoag Interp      Comment:  Beta-2 Glycoprotein I Ab, IgG     0 - 20 GPI IgG units <9  Beta-2-Glycoprotein I IgM     0 - 32 GPI IgM units <9  Beta-2-Glycoprotein I IgA     0 - 25 GPI IgA units <9  Anticardiolipin Ab,IgG,Qn      0 - 14 GPL U/mL <9  Anticardiolipin Ab,IgM,Qn     0 - 12 MPL U/mL <9  Anticardiolipin Ab,IgA,Qn     0 - 11 APL U/mL <9  LDH     98 - 192 U/L 359 (H)  ANA Ab, IFA      Negative   Lupus Anticoag Interp  Comment: VC   Comment: (NOTE)  No lupus anticoagulant was detected.   ECHO bubble study: SUMMARY   Limited bubble study for shunt. No evidence for atrial level right to left shunt.   [place] ext venous 9/8:  Summary: Right: Findings consistent with acute deep vein thrombosis involving the right peroneal veins. Left: Findings consistent with acute deep vein thrombosis involving the left common femoral vein, left femoral vein, left proximal profunda vein, left popliteal vein, left posterior tibial veins, and left peroneal veins. Extending up into left iliac vein  and IVC.  ECHO 07/20/2019: no cardiac source of embolism  1. The left ventricle has normal systolic function with an ejection fraction of 60-65%. The cavity size was normal. Left ventricular diastolic parameters were normal.  2. The right ventricle has normal systolic function. The cavity was normal. There is no increase in right ventricular wall thickness.  3. The pericardial effusion is circumferential.  4. Trivial pericardial effusion is present.  5. The mitral valve is grossly normal.  6. The tricuspid valve is grossly normal.  7. The aortic valve is tricuspid. No stenosis of the aortic valve.  8. The aorta is normal unless otherwise noted.  9. The aortic root is normal in size and structure. 10. No cardiac source of embolism identified.  A: 43 year old unfortunate African-American gentleman with   1)Massive ischemic Right hemispheric CVA -unclear etiology .  Has had secondary hemorrhagic transformation. 2) bilateral lower extremity DVT and PE.-This could have been due to the patient being down for an unknown period of time after his massive stroke.  Progression or additional risk factor would also be significant tissue  factor release from significant injury to brain tissue. 3) status post IVC filter placement.  -Echo with bubble study showed no evidence of cardiac source of embolus and no overt right-to-left shunt at atrial level to suggest the possibility of paradoxical embolization. -Patient did not have any polycythemia or thrombocytosis on admission to suggest an overt myeloproliferative neoplasm such as polycythemia vera or essential thrombocytosis. -Initial labs are negative for antiphospholipid antibody syndrome with negative lupus anticoagulant negative cardiolipin antibodies and negative beta-2 glycoprotein antibodies. -Urine tox was positive for marijuana no known history of obvious amphetamine or cocaine use. -No previous history of sickle cell disease. PLAN -Neurology driving work-up for stroke etiology.  Would need to rule out other arterial disease vasculitis extracranial dissection etc. will defer to neurology for additional work-up on this. -Hemoglobin electrophoresis to evaluate for possible hemoglobinopathy such as sickle cell trait unlikely to have disease given absence of anemia on admission. -Low likelihood of thrombotic microangiopathy is in the absence of anemia or thrombocytopenia on initial admission. -Will send Jak 2 mutation testing for ruling out subclinical concerns for clonal myeloproliferative neoplasm. -Cannot rule out paradoxical embolization with a right-to-left shunt at non-atrial level. -We will complete hypercoagulability work-up-ordered factor V Leiden mutation, prothrombin gene mutation, protein C and protein S testing.  Homocystine levels. Antithrombin III levels would likely be affected by his IV heparin. -Anticoagulation would be indicated for treatment of his DVT and PE however risk of recurrent intracranial bleed will need to be determined by neurology and the decision for continued therapeutic anticoagulation. -Duration of anticoagulation will be determined by possible  etiology of his arterial and venous thrombosis and risk of bleeding and also his overall prognosis. -We shall follow-up as outpatient depending on his goals of care on discharge.   Sullivan Lone MD MS

## 2019-08-03 NOTE — Progress Notes (Signed)
ANTICOAGULATION CONSULT NOTE  Pharmacy Consult: Heparin Indication: DVT/PE  Allergies  Allergen Reactions  . Hydrocodone Nausea Only    Patient Measurements: Height: 5\' 11"  (180.3 cm) Weight: 223 lb 5.2 oz (101.3 kg) IBW/kg (Calculated) : 75.3 Heparin Dosing Weight: 96 kg  Vital Signs: Temp: 100.6 F (38.1 C) (09/09 0400) Temp Source: Axillary (09/09 0400) BP: 119/78 (09/09 0500) Pulse Rate: 110 (09/09 0500)  Labs: Recent Labs    07/31/19 0715 08/01/19 0450 08/01/19 1805 08/01/19 2217 08/02/19 0222 08/03/19 0512  HGB 13.8 12.2*  --   --  11.9* 11.2*  HCT 42.4 37.5*  --   --  35.6* 34.3*  PLT PLATELET CLUMPS NOTED ON SMEAR, UNABLE TO ESTIMATE 214  --   --  198 185  HEPARINUNFRC <0.10*  --   --   --   --  0.22*  CREATININE 0.95 1.14  --   --  1.05  --   TROPONINIHS  --   --  8 10 13   --     Estimated Creatinine Clearance: 112.2 mL/min (by C-G formula based on SCr of 1.05 mg/dL).  Assessment: Patient had an acute ischemic stroke and he did not receive tPA. Incidental finding of DVT and IVC filter was placed on 8/27 because patient was not a candidate for anticoagulation due to craniectomy. Was cleared for IV heparin; however, 9/5 hemorrhagic conversion with hematoma.   Hgb 11.9, plt 198. CT on 9/6 stable. On SQH for DVT ppx. CTA showing non-occlusive thrombus with L posterior lower lobe segmental artery. Okay per Neuro to start IV heparin - will target low end of goal and utilize no bolus approach.   9/9 AM update:  Back on heparin drip, heparin level is low this AM, no issues per RN  Goal of Therapy:  Heparin level 0.3-0.5 units/mL  Monitor platelets by anticoagulation protocol: Yes   Plan:  Inc heparin drip to 1250 units/hr Re-check heparin level in 8 hours  Narda Bonds, PharmD, [place] Pharmacist Phone: [phone]

## 2019-08-03 NOTE — Progress Notes (Addendum)
ANTICOAGULATION CONSULT NOTE  Pharmacy Consult:  Heparin Indication:  DVT/PE  Allergies  Allergen Reactions  . Hydrocodone Nausea Only    Patient Measurements: Height: 5\' 11"  (180.3 cm) Weight: 223 lb 5.2 oz (101.3 kg) IBW/kg (Calculated) : 75.3 Heparin Dosing Weight: 96 kg  Vital Signs: Temp: 99.2 F (37.3 C) (09/09 1200) Temp Source: Axillary (09/09 1200) BP: 123/91 (09/09 1400) Pulse Rate: 104 (09/09 1400)  Labs: Recent Labs    08/01/19 0450 08/01/19 1805 08/01/19 2217 08/02/19 0222 08/03/19 0512 08/03/19 1400  HGB 12.2*  --   --  11.9* 11.2*  --   HCT 37.5*  --   --  35.6* 34.3*  --   PLT 214  --   --  198 185  --   HEPARINUNFRC  --   --   --   --  0.22* 0.35  CREATININE 1.14  --   --  1.05 0.95  --   TROPONINIHS  --  8 10 13   --   --     Estimated Creatinine Clearance: 124 mL/min (by C-G formula based on SCr of 0.95 mg/dL).   Assessment: 89 YOM presented with acute ischemic CVA and did not receive tPA.  There was an incidental finding of DVT and IVC filter was placed on 07/21/19 as patient was not a candidate for [facility] due to craniectomy.  He was cleared to start IV heparin, but then stopped on 07/30/19 due to right frontal lobe hemorrhage.  CTA on 08/01/19 showed non-occlusive thrombus and repeat Doppler on 08/02/19 positive for bilateral DVTs.  Heparin restarted on 08/02/19 given extensive DVTs and PE.    Heparin level is therapeutic; no bleeding reported.  Spoke to 4N RN, heparin has been infusing at 1250 units/hr since her shift started (order hasn't been charted on Epic).  Confirmed rate on pump in 22M.  Goal of Therapy:  Heparin level 0.3-0.5 units/ml Monitor platelets by anticoagulation protocol: Yes    Plan:   Continue heparin gtt at 1250 units/hr Daily heparin level and CBC   Racquelle Hyser D. Mina Marble, PharmD, BCPS, [place] 08/03/2019, 2:48 PM

## 2019-08-03 NOTE — Progress Notes (Signed)
PROGRESS NOTE  Matthew Black [id number] DOB: 1976-09-25   PCP: Colon Branch, MD  Patient is from: home  DOA: 07/19/2019 LOS: 6  Brief Narrative / Interim history: "Matthew Black is an 43 y.o. male without significant known PMH who presented on 8/25 with AMS as well as L hemiparesis and aphasia.  UDS + for THC.  CT with large R hemispheric infarct with confluent cytotoxic edema in R ACA and MCA territories and mild mass effect.  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  He was admitted to [facility] with neurology consultation and started on 3% saline.  Neurosurgery was consulted and Dr. Kathyrn Sheriff recommended early decompressive hemicraniectomy, which was performed that night.   He subsequently developed hemorrhagic conversion with hematoma on 9/5; helmet was removed to further release intracranial pressure and heparin was discontinued. LE doppler also indicated DVT in R peroneal, L popliteal, L posterior tibial, and L peroneal veins; IVC filter was placed.   He has had seizure-like activity, for which he is on Keppra.  He was subsequently found to have possible RLL infiltrate and with concern for aspiration he was made NPO and started on Unasyn.  CTA was performed and it showed a non-occlusive LLL thrombus on 9/7.  Subsequent repeat DVT US was performed 9/8 and showed acute R DVT of the peroneal veins as well as acute L DVT involving common femoral vein, L femoral vein, L proximal profunda vein, L popliteal vein, L posterior tibial veins, and L peroneal veins - and it extends up into the left iliac vein and IVC".   Subjective: No major events overnight of this morning.  Heparin resumed and no adverse effect so far.  He is somnolent although he spontaneously moves his right upper and lower extremities.  He does not move his left extremities.  Did not respond to noxious stimuli.  He does not appear to be in distress.  Objective: Vitals:   08/03/19 0900 08/03/19 1000 08/03/19 1100  08/03/19 1200  BP: (!) 128/91 122/80 116/76 125/80  Pulse: (!) 114 99 (!) 104 (!) 106  Resp: (!) 21 (!) 22 19 14   Temp:    99.2 F (37.3 C)  TempSrc:    Axillary  SpO2: 99% 100% 100% 100%  Weight:      Height:        Intake/Output Summary (Last 24 hours) at 08/03/2019 1328 Last data filed at 08/03/2019 1200 Gross per 24 hour  Intake 8881.31 ml  Output 1600 ml  Net 7281.31 ml   Filed Weights   07/31/19 0133 08/01/19 0142 08/02/19 0500  Weight: 99.8 kg 98.2 kg 101.3 kg    Examination:  GENERAL: No acute distress.  Appears well.  HEENT: MMM.  Vision and hearing grossly intact.  Core track.  Staples on his head. NECK: Supple.  No apparent JVD.  RESP:  No IWOB. Good air movement bilaterally. CVS:  RRR. Heart sounds normal.  ABD/GI/GU: Bowel sounds present. Soft. Non tender.  MSK/EXT: Moves right upper and lower extremities but not left. SKIN: Craniotomy staples on his head.  Honeycomb dressing over right abdomen DCI. NEURO: Somnolent but spontaneously moves right upper and lower extremities.  Does not move left extremities.  PERRL.  Does not respond to noxious stimuli.  No facial asymmetry. PSYCH: Calm.  No distress.  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  Heparin resumed without bolus  on 9/8. -H&H seems to be stable.  Closely monitor neuro status. -Neurology and neurosurgery managing.  Bilateral lower extremity DVT Nonocclusive LLL PE -Hypercoagulable lab negative. -IVC filter in place. -Heparin resumed on 9/8.  H&H relatively stable.  Seizure-like activity: -Continue Keppra per neurology  Fever and leukocytosis with bandemia -Still with some fever and significant leukocytosis. -Blood and urine cultures negative to date. -Empiric Unasyn 9/6>>> for possible aspiration although chest x-ray  did not reveal this.  -We will escalate to broad-spectrum antibiotic for possible hospital-acquired infection if no improvement.  Normocytic anemia: Stable -We will monitor.  Dysphagia/metabolic encephalopathy likely due to massive CVA. [name] per core track  DVT prophylaxis: On heparin drip Code Status: Full code Family Communication:  Available if any question. Disposition Plan: Remains in ICU Consultants: We are  Procedures:  Hemicraniectomy  Microbiology summarized: Urine cultures negative Blood cultures negative  Antimicrobials: Anti-infectives (From admission, onward)   Start     Dose/Rate Route Frequency Ordered Stop   07/31/19 2200  Ampicillin-Sulbactam (UNASYN) 3 g in sodium chloride 0.9 % 100 mL IVPB     3 g 200 mL/hr over 30 Minutes Intravenous Every 6 hours 07/31/19 1401     07/31/19 1415  Ampicillin-Sulbactam (UNASYN) 3 g in sodium chloride 0.9 % 100 mL IVPB     3 g 200 mL/hr over 30 Minutes Intravenous STAT 07/31/19 1401 07/31/19 1551   07/19/19 2315  bacitracin 50,000 Units in sodium chloride 0.9 % 500 mL irrigation  Status:  Discontinued       As needed 07/19/19 2315 07/19/19 2339      Sch Meds:  Scheduled Meds: . chlorhexidine  15 mL Mouth Rinse BID  . Chlorhexidine Gluconate Cloth  6 each Topical Daily  . famotidine  20 mg Per Tube BID  . feeding supplement (PRO-STAT SUGAR FREE 64)  30 mL Per Tube TID  . free water  200 mL Per Tube Q8H  . levETIRAcetam  500 mg Per Tube BID  . mouth rinse  15 mL Mouth Rinse q12n4p  . metoprolol tartrate  50 mg Oral Q8H  . sodium chloride flush  10-40 mL Intracatheter Q12H   Continuous Infusions: . sodium chloride 10 mL/hr at 08/01/19 1715  . sodium chloride 75 mL/hr at 08/03/19 1004  . ampicillin-sulbactam (UNASYN) IV 3 g (08/03/19 0916)  . feeding supplement (OSMOLITE 1.5 CAL) 1,000 mL (08/03/19 1126)  . heparin 1,150 Units/hr (08/02/19 2037)   PRN Meds:.sodium chloride, acetaminophen **OR** acetaminophen  (TYLENOL) oral liquid 160 mg/5 mL **OR** acetaminophen, labetalol, senna-docusate, sodium chloride flush   I have personally reviewed the following labs and images: CBC: Recent Labs  Lab 07/30/19 0434 07/31/19 0715 08/01/19 0450 08/02/19 0222 08/03/19 0512  WBC 16.1* 15.2* 13.5* 17.5* 16.4*  NEUTROABS 11.5*  --   --   --   --   HGB 12.0* 13.8 12.2* 11.9* 11.2*  HCT 36.5* 42.4 37.5* 35.6* 34.3*  MCV 86.1 87.1 87.4 85.8 87.3  PLT 236 PLATELET CLUMPS NOTED ON SMEAR, UNABLE TO ESTIMATE 214 198 185   BMP &GFR Recent Labs  Lab 07/30/19 0434 07/31/19 0715 08/01/19 0450 08/02/19 0222 08/03/19 0512  NA 140 139 140 137 136  K 3.9 4.4 4.3 4.5 4.2  CL 106 103 104 105 104  CO2 24 25 25 24 22   GLUCOSE 128* 126* 136* 142* 138*  BUN 22* 22* 23* 24* 24*  CREATININE 0.95 0.95  1.14 1.05 0.95  CALCIUM 9.2 9.3 8.8* 8.6* 8.7*   Estimated Creatinine Clearance: 124 mL/min (by C-G formula based on SCr of 0.95 mg/dL). Liver & Pancreas: Recent Labs  Lab 07/31/19 0715 08/02/19 0222  AST 97* 63*  ALT 174* 116*  ALKPHOS 95 94  BILITOT 0.7 0.8  PROT 8.0 7.2  ALBUMIN 3.4* 3.0*   No results for input(s): LIPASE, AMYLASE in the last 168 hours. No results for input(s): AMMONIA in the last 168 hours. Diabetic: No results for input(s): HGBA1C in the last 72 hours. Recent Labs  Lab 08/03/19 0008 08/03/19 0331 08/03/19 0757 08/03/19 1203 08/03/19 1302  GLUCAP 156* 122* 126* 132* 133*   Cardiac Enzymes: No results for input(s): CKTOTAL, CKMB, CKMBINDEX, TROPONINI in the last 168 hours. No results for input(s): PROBNP in the last 8760 hours. Coagulation Profile: No results for input(s): INR, PROTIME in the last 168 hours. Thyroid Function Tests: No results for input(s): TSH, T4TOTAL, FREET4, T3FREE, THYROIDAB in the last 72 hours. Lipid Profile: No results for input(s): CHOL, HDL, LDLCALC, TRIG, CHOLHDL, LDLDIRECT in the last 72 hours. Anemia Panel: No results for input(s): VITAMINB12,  FOLATE, FERRITIN, TIBC, IRON, RETICCTPCT in the last 72 hours. Urine analysis:    Component Value Date/Time   COLORURINE AMBER (A) 08/01/2019 0350   APPEARANCEUR CLEAR 08/01/2019 0350   LABSPEC 1.033 (H) 08/01/2019 0350   PHURINE 5.0 08/01/2019 0350   GLUCOSEU NEGATIVE 08/01/2019 0350   HGBUR NEGATIVE 08/01/2019 0350   BILIRUBINUR NEGATIVE 08/01/2019 0350   KETONESUR NEGATIVE 08/01/2019 0350   PROTEINUR NEGATIVE 08/01/2019 0350   NITRITE NEGATIVE 08/01/2019 0350   LEUKOCYTESUR NEGATIVE 08/01/2019 0350   Sepsis Labs: Invalid input(s): PROCALCITONIN, [place]  Microbiology: Recent Results (from the past 240 hour(s))  Culture, blood (routine x 2)     Status: None (Preliminary result)   Collection Time: 07/30/19  1:09 PM   Specimen: BLOOD LEFT ARM  Result Value Ref Range Status   Specimen Description BLOOD LEFT ARM  Final   Special Requests   Final    BOTTLES DRAWN AEROBIC AND ANAEROBIC Blood Culture adequate volume   Culture   Final    NO GROWTH 4 DAYS Performed at [facility], [address] [place], [place] [address]    Report Status PENDING  Incomplete  Culture, blood (routine x 2)     Status: None (Preliminary result)   Collection Time: 07/30/19  1:09 PM   Specimen: BLOOD RIGHT HAND  Result Value Ref Range Status   Specimen Description BLOOD RIGHT HAND  Final   Special Requests   Final    BOTTLES DRAWN AEROBIC ONLY Blood Culture results may not be optimal due to an inadequate volume of blood received in culture bottles   Culture   Final    NO GROWTH 4 DAYS Performed at [facility], [place] [address], [place], [place] [address]    Report Status PENDING  Incomplete  Culture, blood (Routine X 2) w Reflex to ID Panel     Status: None (Preliminary result)   Collection Time: 08/01/19  4:50 AM   Specimen: BLOOD  Result Value Ref Range Status   Specimen Description BLOOD PICC LINE  Final   Special Requests   Final    BOTTLES DRAWN AEROBIC ONLY Blood Culture  adequate volume   Culture   Final    NO GROWTH 2 DAYS Performed at [facility], [place] [address] [place], [place] [address]  Report Status PENDING  Incomplete  Culture, blood (Routine X 2) w Reflex to ID Panel     Status: None (Preliminary result)   Collection Time: 08/01/19  4:50 AM   Specimen: BLOOD RIGHT HAND  Result Value Ref Range Status   Specimen Description BLOOD RIGHT HAND  Final   Special Requests   Final    BOTTLES DRAWN AEROBIC ONLY Blood Culture adequate volume   Culture   Final    NO GROWTH 2 DAYS Performed at [facility], [address], [place], [place] [address]    Report Status PENDING  Incomplete    Radiology Studies: No results found.   Jaquil Todt T. Donnellson  If 7PM-7AM, please contact night-coverage [email or web address] Password [email or web address] 08/03/2019, 1:28 PM

## 2019-08-03 NOTE — Progress Notes (Signed)
  Echocardiogram 2D Echocardiogram has been performed.  Matthew Black 08/03/2019, 10:46 AM

## 2019-08-03 NOTE — Progress Notes (Signed)
Chaplain making afternoon rounds. Made wife aware of spiritual care services available 24/7. Wife was thankful to chaplain for stopping by. Chaplain remains avible per pt or family request.   Chaplain Resident, Evelene Croon, [place] Pager # [phone]

## 2019-08-03 NOTE — Progress Notes (Signed)
STROKE TEAM PROGRESS NOTE   INTERVAL HISTORY Pt lying in bed, follows simple commands on the right, slight drowsy. Tmax 100.8, HR 102 this am. BP stable. No neuro changes. On heparin IV.   Vitals:   08/03/19 0600 08/03/19 0700 08/03/19 0800 08/03/19 0900  BP: 120/86 116/83 123/85 (!) 128/91  Pulse: (!) 115 (!) 115 (!) 118 (!) 114  Resp: (!) 23 19 (!) 22 (!) 21  Temp:   (!) 100.8 F (38.2 C)   TempSrc:      SpO2: 99% 100% 100% 99%  Weight:      Height:        CBC:  Recent Labs  Lab 07/30/19 0434  08/02/19 0222 08/03/19 0512  WBC 16.1*   < > 17.5* 16.4*  NEUTROABS 11.5*  --   --   --   HGB 12.0*   < > 11.9* 11.2*  HCT 36.5*   < > 35.6* 34.3*  MCV 86.1   < > 85.8 87.3  PLT 236   < > 198 185   < > = values in this interval not displayed.    Basic Metabolic Panel:  Recent Labs  Lab 08/02/19 0222 08/03/19 0512  NA 137 136  K 4.5 4.2  CL 105 104  CO2 24 22  GLUCOSE 142* 138*  BUN 24* 24*  CREATININE 1.05 0.95  CALCIUM 8.6* 8.7*    IMAGING Ct Head Wo Contrast Ct Cervical Spine Wo Contrast  07/19/2019 1132 1. 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 2. No associated hemorrhage and mild intracranial mass effect at this time, including trace leftward midline shift. 3. Evidence of large vessel occlusion: Hyperdensity of the right ICA terminus, the right A1 and MCA. 4. Unaffected brain parenchyma appears negative. 5.  No acute traumatic injury identified in the cervical spine.   Mr Brain 22 Contrast Mr Angio Head Wo Contrast Mr Angio Neck Wo Contrast 07/19/2019 1330 1. 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 The right ICA terminus, right ACA and MCA also appear occluded. 2. 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 Cytotoxic edema but no convincing hemorrhage. Stable mild mass effect with trace leftward midline shift. 3. Intracranial MRA is degraded by motion artifact, but no other  large vessel occlusion is suspected. There appears to be lobes reperfusion of the right PCA. 4. The left hemisphere and posterior fossa brain parenchyma appears normal.   Ct Head Wo Contrast 07/19/2019 1532 Similar appearance to earlier. Low-density and swelling of the right hemisphere in the ACA and MCA territories with mass effect and right-to-left shift of 5-6 mm. No hemorrhagic transformation at this time.   Ct Head Wo Contrast 07/20/2019 0444 1. Acute right ACA and MCA territory infarct with progressive swelling that has decompressed through the craniectomy defect. 2. No acute hemorrhage or new infarction.   Ct Angio Head W Or Wo Contrast Ct Angio Neck W Or Wo Contrast 07/21/2019 1. Stable from prior MRA. 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 No evidence of atherosclerosis or vasculopathy in the other vessels. 2. Cytotoxic edema causes 5 mm of midline shift and brain bulging through the craniectomy defect. Mild petechial hemorrhage is seen at the basal ganglia.   Ct Head Wo Contrast 07/23/2019 1. Unchanged appearance of massive right MCA and ACA territory infarcts with parenchyma extending through decompressive craniectomy. 2. Small focus of suspected hemorrhage  adjacent to the right caudate head.  Ct Head Wo Contrast 07/30/2019 1. Decreasing mass effect within large right anterior MCA and ACA territory infarct. 2. Midline shift is no longer present. There is decreased effacement of the right lateral ventricle. 3. Infarcted brain tissue still herniates through the craniectomy site. 4. New parenchymal hemorrhage within the infarcted tissue anteriorly measures 2.5 x 2.3 x 2.7 cm. 5. No new infarct.  Ct Head Wo Contrast 08/01/2019  1. Stable head CT since 07/30/2019. 2. Again noted is a large infarct involving the right MCA and right ACA territories with brain tissue herniating through the right craniotomy defect. 3. Parenchymal hemorrhage  in the right anterior cortex has minimally changed. Stable petechial hemorrhage in the right basal ganglia region.   Dg Chest Port 1 View 08/01/2019 No acute disease   Ct Angio Chest Pe W Or Wo Contrast 08/01/2019 Nonocclusive thrombus seen within the left posterior lower lobe segmental artery.    2D Echocardiogram  1. The left ventricle has normal systolic function with an ejection fraction of 60-65%. The cavity size was normal. Left ventricular diastolic parameters were normal.  2. The right ventricle has normal systolic function. The cavity was normal. There is no increase in right ventricular wall thickness.  3. The pericardial effusion is circumferential.  4. Trivial pericardial effusion is present.  5. The mitral valve is grossly normal.  6. The tricuspid valve is grossly normal.  7. The aortic valve is tricuspid. No stenosis of the aortic valve.  8. The aorta is normal unless otherwise noted.  9. The aortic root is normal in size and structure. 10. No cardiac source of embolism identified. 11. When compared to the prior study: No comparison.  LE Dopplers 08/02/2019 Right: Findings consistent with acute deep vein thrombosis involving the right peroneal veins.  Left: Findings consistent with acute deep vein thrombosis involving the left common femoral vein, left femoral vein, left proximal profunda vein, left popliteal vein, left posterior tibial veins, and left peroneal veins. Extending up into left iliac vein  and IVC.  07/21/2019 Right: Findings consistent with acute deep vein thrombosis involving the right peroneal veins. Left: Findings consistent with acute deep vein thrombosis involving the left popliteal vein, left posterior tibial veins, and left peroneal veins.  Vas [place] Transcranial Doppler W Bubbles 07/25/2019 No HITS heard heard at rest. No HITS heard heard during valsalva.    PHYSICAL EXAM    General - Well nourished, well developed, middle-aged African-American male.   He has right hemicraniectomy surgical incision on the scalp.  Ophthalmologic - fundi not visualized due to noncooperation.  Cardiovascular - Regular rhythm, mild tachycardia.  Neuro - patient is lethargic, eyes closed but easily open on voice. Nonverbal, but able to follow simple commands on the right hand and foot today. Eyes in right gaze position, not blinking to visual threat to the left, PERRL. Left facial droop.  Tongue protrusion not corporative.  Has spontaneous movement of RUE against gravity 4/5 at least and RLE in bed 2/5 at least. On pain stimulation, mild withdraw of LLE, but no movement of LUE. DTR 1+ and no babinski. Sensation, coordination and gait not tested.   ASSESSMENT/PLAN Matthew Black is a 43 y.o. male with no significant past medical history found down x 2 days nonverbal with L hemiparesis.   Stroke:  R MCA/ACA infarct w/ R ICA, R A1, R MCA occlusion w/ cerebral edema s/p hemicraniectomy w/ abd flap implant - etiology unclear Hemorrhagic conversion with  hematoma 07/30/19  CT head large R brain infarct w/ edema R ACA and MCA territories. Trace L midline shift. ELVO at R ICA, R A1, R MCA.  MRI  Large R brain infarct sparing R PCA. Cytotoxic edema but no hemorrhage. Stable trace midline shift.  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 R ICA terminus, R ACA, R MCA occluded.   CT head similar w/ low density and sweddling R ACA and MCA territories, now with 5-29mm midline shift. no hemorrhage  CT head acute R ACA and MCA infarct w/ progressive swelling decompressed through craniectomy defect.   CTA head and neck stable MLS. R ICA occlusion in neck that continues into R ACA and MCA. Mild petechial hemorrhage at basal ganglia  CT Head 8/29 - Unchanged appearance of massive right MCA and ACA territory infarcts with parenchyma extending through decompressive craniectomy. Small focus of suspected hemorrhage adjacent to the right caudate head.  CT Head WO - 07/30/19 -  New hematoma within the infarcted tissue   CT repeat 08/01/2019 stable w/o change  2D Echo EF 60-65%  2D echo w/ bubble ordered by TS pending   LE venous doppler DVT in R peroneal, L popliteal, L posterior tibial and L peroneal  LE venous doppler repeat DVT in R peroneal, L popliteal, L posterior tibial and L peroneal  CT chest Nonocclusive thrombus seen within the left posterior lower lobe segmental artery.   TCD bubble no HITS, no PFO  LDL 83  HgbA1c 5.4  UDS positive THC  Hypercoagulable and autoimmune work up negative  Heparin 1500 subq for VTE prophylaxis  No antithrombotic prior to admission, treated DVT with heparin IV, then  discontinued due to hemorrhagic conversion with right frontal hematoma, now resumed heparin IV given extensive DVT and PE  Therapy recommendations:  SNF  Disposition:  pending   Cyctotoxic cerebral edema  S/p R decompressive hemicraniectomy (Nundkumar) w/ flap R abd 07/19/2019  PICC placed - keep for now per Leonie Man  Given One dose of 23.4% 8/26; 3%  off 8/30 1700  Na 146 -> 140  Increase NS to 75cc and decrease free water to 200 Q8  Monitor Na daily  CT repeat 07/30/19 - New parenchymal hemorrhage within the infarcted tissue  Off helmet to further release pressure  CT repeat 08/01/2019 stable w/o change  Close neuro check since on heparin IV  Right ICA occlusion  MRA and CTA showed right ICA occlusion from origin to terminal  Wife denies any head trauma  Wife stated that pt had recent aggressive exercise with weight lifting  Concerning dissection as working diagnosis of stroke etiology   B LE DVT Small LLL PE  LE venous doppler DVT in R peroneal, L popliteal, L posterior tibial and L peroneal  Etiology unclear  Started iv heparin 07/26/2019  IVC filter placed 8/27. Plan retrieval in 8-12 weeks as an IP in an IR clinic  Hypercoagulable work up negative   Off SCDs  LE venous doppler 08/02/19 repeat extensive DVT in R  peroneal, L popliteal, L posterior tibial and L peroneal up to the IVC filter  CT chest Nonocclusive thrombus seen within the left posterior lower lobe segmental artery.   hydrated w/ IVF @ 75cc, continue free water and TF  Treated with IV Heparin but then off secondary to intracranial hemorrhage  Restarted IV heparin given extensive DVT and PE with close neuro monitoring  Appreciate TRH assistance on this complicated case  Seizure-like activity  Continue Keppra  EEG continuous slowing, excessive beta activity, sleep spindle asymmetry decreased R->related to stroke and sedation. No SZ  Long Term EEG cortical dysfunction in right frontal region  Seizure precautions  Febrile, Leukocytosis  Tmax - 101.8->99.7->101.8->101.5->99.6->98.3-100.8  WBC - 11.8->14.5->16.1->13.5->17.5->16.4  CXR 08/01/2019 NAD  U/A x 2 - negative  Blood cultures - NGTD, repeat BCx no growth 2 d  on empiric unasyn  Lactic acid 1.2->2.0  Tachycardia, improving  HR 98 - 120  likely due to fever and dehydration and PE  Hydration with IVF, TF and free water  Increase metoprolol to 50 Q8h  EKG ST, rate 120 w/ possible LA enlargement  Troponin series neg x 3    Consider cardiology consult if not improving  Hyperlipidemia  Home meds:  no statin  LDL 83, goal < 70  Was on lipitor 20 mg daily   D/c statin due to elevated LFT  Dysphagia . Secondary to stroke . NPO  Cortrak w/ TF @ 55cc  Decreased free water 200cc q4->q8   Speech on board - continue NPO  Other Stroke Risk Factors  Former Cigarette smoker, quit 3 yrs ago  ETOH use  Substance abuse UDS - positive for THC   Obesity, Body mass index is 31.15 kg/m., recommend weight loss, diet and exercise as appropriate   Other Active Problems  Elevated LFTs -  AST - 132->219->97->63 ; ALT - 68->244->174->116  - improving   Hypokalemia - 3.5->3.4 - supplement ->3.5- 3.2->3.4 - replace - 4.5-4.2  Hospital day # 15  This  patient is critically ill due to right large infarct, cerebral edema, hemorrhagic conversion, extensive DVT, PE, tachycardia, right carotid occlusion and at significant risk of neurological worsening, death form recurrent stroke, brain herniation, hemorrhagic conversion, PE, heart failure, respiratory failure, status epilepticus. This patient's care requires constant monitoring of vital signs, hemodynamics, respiratory and cardiac monitoring, review of multiple databases, neurological assessment, discussion with family, other specialists and medical decision making of high complexity. I spent 35 minutes of neurocritical care time in the care of this patient.  Rosalin Hawking, MD PhD Stroke Neurology 08/03/2019 10:00 AM   To contact Stroke Continuity provider, please refer to [email or web address] After hours, contact General Neurology

## 2019-08-03 NOTE — Progress Notes (Addendum)
Physical Therapy Treatment Patient Details Name: Matthew Black MRN: [id number] DOB: 1976-10-02 Today's Date: 08/03/2019    History of Present Illness Pt is a 43 y.o. M with no known PMH who presents on 8/25 after being found down at work, nonverbal with L hemiparesis. CT head showing large R hemisphere infarct with edema and mild mass effect. 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 Stable trace midline shift. S/p hemicraniectomy with abdominal flap implant. BLE DVT vasc US 8/28: acute DVT bilateral lower extremities, s/p IVC placement.     PT Comments    Pt more alert this session, following right sided commands. Tolerated sitting on edge of bed ~20 minutes, requiring max assist for sitting balance. Continues with left hemiparesis, heavy left lateral lean, pushing with right upper extremity. Session focused on postural re-education, left passive range of motion, right sided strengthening, static and dynamic seated balance. D/c plan remains appropriate.     Follow Up Recommendations  SNF     Equipment Recommendations  Other (comment)(TBD)    Recommendations for Other Services       Precautions / Restrictions Precautions Precautions: Fall Precaution Comments: L hemiparesis, abd bone flap Required Braces or Orthoses: Other Brace Other Brace: helmet  Restrictions Weight Bearing Restrictions: No    Mobility  Bed Mobility Overal bed mobility: Needs Assistance Bed Mobility: Supine to Sit;Sit to Supine Rolling: Total assist;+2 for physical assistance   Supine to sit: Total assist;+2 for physical assistance Sit to supine: Total assist;+2 for physical assistance   General bed mobility comments: TotalA + 2 for all aspects of bed mobility  Transfers                 General transfer comment: Unable   Ambulation/Gait                 Stairs             Wheelchair Mobility    Modified Rankin (Stroke Patients  Only) Modified Rankin (Stroke Patients Only) Pre-Morbid Rankin Score: No symptoms Modified Rankin: Severe disability     Balance Overall balance assessment: Needs assistance Sitting-balance support: Feet supported;Single extremity supported Sitting balance-Leahy Scale: Zero Sitting balance - Comments: poor head control, weak cervical extensors, pushing with RUE                                    Cognition Arousal/Alertness: Awake/alert Behavior During Therapy: Flat affect Overall Cognitive Status: Impaired/Different from baseline Area of Impairment: Attention;Awareness;Following commands;Safety/judgement;Problem solving                   Current Attention Level: Focused   Following Commands: Follows one step commands with increased time;Follows one step commands inconsistently Safety/Judgement: Decreased awareness of safety;Decreased awareness of deficits Awareness: Intellectual Problem Solving: Requires tactile cues;Requires verbal cues General Comments: Following right sided commands, snapping fingers for yes vs no but not consistently      Exercises General Exercises - Lower Extremity Long Arc Quad: Right;15 reps;Seated Other Exercises Other Exercises: PROM LUE D1/D2 Other Exercises: Static sitting: Cervical extension stretch, cervical rotation to left and right, lateral leans to L    General Comments  VSS      Pertinent Vitals/Pain Pain Assessment: Faces Faces Pain Scale: No hurt    Home Living  Prior Function            PT Goals (current goals can now be found in the care plan section) Acute Rehab PT Goals Patient Stated Goal: unable PT Goal Formulation: Patient unable to participate in goal setting Time For Goal Achievement: 08/07/19 Potential to Achieve Goals: Fair Progress towards PT goals: Progressing toward goals    Frequency    Min 3X/week      PT Plan Current plan remains appropriate     Co-evaluation              AM-PAC PT "6 Clicks" Mobility   Outcome Measure  Help needed turning from your back to your side while in a flat bed without using bedrails?: Total Help needed moving from lying on your back to sitting on the side of a flat bed without using bedrails?: Total Help needed moving to and from a bed to a chair (including a wheelchair)?: Total Help needed standing up from a chair using your arms (e.g., wheelchair or bedside chair)?: Total Help needed to walk in hospital room?: Total Help needed climbing 3-5 steps with a railing? : Total 6 Click Score: 6    End of Session   Activity Tolerance: Patient tolerated treatment well Patient left: in bed;with call bell/phone within reach;with family/visitor present Nurse Communication: Mobility status PT Visit Diagnosis: Hemiplegia and hemiparesis;Other symptoms and signs involving the nervous system (R29.898);Other abnormalities of gait and mobility (R26.89) Hemiplegia - Right/Left: Left Hemiplegia - dominant/non-dominant: Non-dominant Hemiplegia - caused by: Cerebral infarction     Time: 1135-1202 PT Time Calculation (min) (ACUTE ONLY): 27 min  Charges:  $Therapeutic Activity: 8-22 mins $Neuromuscular Re-education: 8-22 mins                     Ellamae Sia, PT, DPT Acute Rehabilitation Services Pager [phone] Office [phone]    Willy Eddy 08/03/2019, 2:22 PM

## 2019-08-03 NOTE — Progress Notes (Signed)
Pharmacy Antibiotic Note  Matthew Black is a 43 y.o. male admitted on 07/19/2019 with acute CVA and now with concern for aspiration PNA. Marland Kitchen  Pharmacy has been consulted for Unasyn dosing.    Renal function stable, Tmax 100.8, WBC 16.4.  Plan: Unasyn 3gm IV Q6H Pharmacy will sign off and follow peripherally.  Thank you for the consult!  Height: 5\' 11"  (180.3 cm) Weight: 223 lb 5.2 oz (101.3 kg) IBW/kg (Calculated) : 75.3  Temp (24hrs), Avg:99.9 F (37.7 C), Min:99.2 F (37.3 C), Max:100.8 F (38.2 C)  Recent Labs  Lab 07/30/19 0434 07/31/19 0715 08/01/19 0450 08/01/19 0615 08/01/19 0929 08/02/19 0222 08/03/19 0512  WBC 16.1* 15.2* 13.5*  --   --  17.5* 16.4*  CREATININE 0.95 0.95 1.14  --   --  1.05 0.95  LATICACIDVEN  --   --   --  1.2 2.0*  --   --     Estimated Creatinine Clearance: 124 mL/min (by C-G formula based on SCr of 0.95 mg/dL).    Allergies  Allergen Reactions  . Hydrocodone Nausea Only    Unasyn 9/6 >>  8/25 BCx - negative 8/25 UCx - negative 8/25 covid/MRSA PCR - negative 9/5 BCx - NGTD 9/7 BCx - NGTD   Matthew Black, PharmD, BCPS, [place] 08/03/2019, 1:19 PM

## 2019-08-03 NOTE — Progress Notes (Signed)
Nutrition Follow-up   RD working remotely.   DOCUMENTATION CODES:   Obesity unspecified  INTERVENTION:   Tube Feeding:  Increase Osmolite 1.5 to 60 ml/hr Increase Pro-Stat 30 mL to TID Provides 125 g of protein, 2460 kcals, 1094 mL of free water Meets 100% estimated calorie and protein needs  Total free water flushes from TF and free water flushes: 1694 mL of free water   NUTRITION DIAGNOSIS:   Inadequate oral intake related to acute illness as evidenced by NPO status.  Being addressed via TF   GOAL:   Patient will meet greater than or equal to 90% of their needs  Being addressed via TF   MONITOR:   TF tolerance, Weight trends, Labs, I & O's, Skin  REASON FOR ASSESSMENT:   Ventilator, Consult Enteral/tube feeding initiation and management  ASSESSMENT:   43 year old male who presented to the ED on 8/25 with AMS. No known PMH. CT head showed large R hemisphere infarct with edema and mild mass effect.  8/25 - Admit, emergent decompressive hemicraniectomy 8/27 - Acute DVT b/l LE, IV placement 8/31 - Extubated 9/01 - Cortrak placed, tube tip in stomach  Pt non-verbal but able to follow simple commands. Lethargic, but arousable Pt remains NPO, Cortrak in place, SLP folliowing  Osmolite 1.5 at 55 ml/hr, Pro-Stat 30 mL BID, 200 mL free water q 8 hours via Cortrak  Admit weight 102.5 kg; weight trending down to 98.2 kg on 9/07. Pt with weight of 101.3 kg on 9/08. Net negative 2 L since admission, mild generalized edema present  Labs: reviewed Meds: Ns at 75 ml/hr   Diet Order:   Diet Order            Diet NPO time specified  Diet effective now              EDUCATION NEEDS:   No education needs have been identified at this time  Skin:  Skin Assessment: Skin Integrity Issues: Skin Integrity Issues:: Stage II, Incisions Stage II: L face Incisions: abdomen, head  Last BM:  9/7  Height:   Ht Readings from Last 1 Encounters:  08/02/19 5\' 11"   (1.803 m)    Weight:   Wt Readings from Last 1 Encounters:  08/02/19 101.3 kg    Ideal Body Weight:  78.2 kg  BMI:  Body mass index is 31.15 kg/m.  Estimated Nutritional Needs:   Kcal:  [id number] kcals  Protein:  117-135  Fluid:  >/= 2.1 L/day   Kerman Passey MS, RDN, LDN, CNSC [phone] Pager  [phone] Weekend/On-Call Pager

## 2019-08-04 ENCOUNTER — Inpatient Hospital Stay (HOSPITAL_COMMUNITY): Payer: Medicaid Other

## 2019-08-04 DIAGNOSIS — R748 Abnormal levels of other serum enzymes: Secondary | ICD-10-CM

## 2019-08-04 DIAGNOSIS — I2699 Other pulmonary embolism without acute cor pulmonale: Secondary | ICD-10-CM

## 2019-08-04 DIAGNOSIS — D6859 Other primary thrombophilia: Secondary | ICD-10-CM

## 2019-08-04 LAB — CBC
HCT: 32 % — ABNORMAL LOW (ref 39.0–52.0)
Hemoglobin: 10.3 g/dL — ABNORMAL LOW (ref 13.0–17.0)
MCH: 28.5 pg (ref 26.0–34.0)
MCHC: 32.2 g/dL (ref 30.0–36.0)
MCV: 88.6 fL (ref 80.0–100.0)
Platelets: 204 10*3/uL (ref 150–400)
RBC: 3.61 MIL/uL — ABNORMAL LOW (ref 4.22–5.81)
RDW: 13.6 % (ref 11.5–15.5)
WBC: 13.6 10*3/uL — ABNORMAL HIGH (ref 4.0–10.5)
nRBC: 0 % (ref 0.0–0.2)

## 2019-08-04 LAB — BASIC METABOLIC PANEL
Anion gap: 10 (ref 5–15)
BUN: 23 mg/dL — ABNORMAL HIGH (ref 6–20)
CO2: 22 mmol/L (ref 22–32)
Calcium: 8.4 mg/dL — ABNORMAL LOW (ref 8.9–10.3)
Chloride: 103 mmol/L (ref 98–111)
Creatinine, Ser: 0.89 mg/dL (ref 0.61–1.24)
GFR calc Af Amer: >60 mL/min (ref >60)
GFR calc non Af Amer: >60 mL/min (ref >60)
Glucose, Bld: 130 mg/dL — ABNORMAL HIGH (ref 70–99)
Potassium: 3.9 mmol/L (ref 3.5–5.1)
Sodium: 135 mmol/L (ref 135–145)

## 2019-08-04 LAB — GLUCOSE, CAPILLARY
Glucose-Capillary: 102 mg/dL — ABNORMAL HIGH (ref 70–99)
Glucose-Capillary: 108 mg/dL — ABNORMAL HIGH (ref 70–99)
Glucose-Capillary: 113 mg/dL — ABNORMAL HIGH (ref 70–99)
Glucose-Capillary: 116 mg/dL — ABNORMAL HIGH (ref 70–99)
Glucose-Capillary: 117 mg/dL — ABNORMAL HIGH (ref 70–99)
Glucose-Capillary: 120 mg/dL — ABNORMAL HIGH (ref 70–99)
Glucose-Capillary: 121 mg/dL — ABNORMAL HIGH (ref 70–99)

## 2019-08-04 LAB — URINALYSIS, COMPLETE (UACMP) WITH MICROSCOPIC
Bacteria, UA: NONE SEEN
Bilirubin Urine: NEGATIVE
Glucose, UA: NEGATIVE mg/dL
Hgb urine dipstick: NEGATIVE
Ketones, ur: NEGATIVE mg/dL
Leukocytes,Ua: NEGATIVE
Nitrite: NEGATIVE
Protein, ur: 30 mg/dL — AB
Specific Gravity, Urine: 1.027 (ref 1.005–1.030)
pH: 5 (ref 5.0–8.0)

## 2019-08-04 LAB — MAGNESIUM: Magnesium: 2.1 mg/dL (ref 1.7–2.4)

## 2019-08-04 LAB — HEPATIC FUNCTION PANEL
ALT: 80 U/L — ABNORMAL HIGH (ref 0–44)
AST: 67 U/L — ABNORMAL HIGH (ref 15–41)
Albumin: 2.8 g/dL — ABNORMAL LOW (ref 3.5–5.0)
Alkaline Phosphatase: 94 U/L (ref 38–126)
Bilirubin, Direct: 0.3 mg/dL — ABNORMAL HIGH (ref 0.0–0.2)
Indirect Bilirubin: 0.4 mg/dL (ref 0.3–0.9)
Total Bilirubin: 0.7 mg/dL (ref 0.3–1.2)
Total Protein: 7.2 g/dL (ref 6.5–8.1)

## 2019-08-04 LAB — HEPARIN LEVEL (UNFRACTIONATED)
Heparin Unfractionated: 0.22 IU/mL — ABNORMAL LOW (ref 0.30–0.70)
Heparin Unfractionated: >2.2 IU/mL — ABNORMAL HIGH (ref 0.30–0.70)
Heparin Unfractionated: 0.43 IU/mL (ref 0.30–0.70)
Heparin Unfractionated: 0.43 IU/mL (ref 0.30–0.70)

## 2019-08-04 LAB — PHOSPHORUS: Phosphorus: 3.5 mg/dL (ref 2.5–4.6)

## 2019-08-04 MED ORDER — CLEVIDIPINE BUTYRATE 0.5 MG/ML IV EMUL: 0.0000 mg/h | INTRAVENOUS | Status: DC

## 2019-08-04 MED ORDER — SODIUM CHLORIDE 0.9 % IV SOLN
2.0000 g | Freq: Three times a day (TID) | INTRAVENOUS | Status: DC
  Administered 2019-08-05 – 2019-08-08 (×10): 2 g via INTRAVENOUS
  Filled 2019-08-04 (×9): qty 2

## 2019-08-04 MED ORDER — VANCOMYCIN HCL 10 G IV SOLR
1250.0000 mg | Freq: Two times a day (BID) | INTRAVENOUS | Status: DC
  Administered 2019-08-05 – 2019-08-07 (×6): 1250 mg via INTRAVENOUS
  Filled 2019-08-04 (×8): qty 1250

## 2019-08-04 MED ORDER — VANCOMYCIN HCL 10 G IV SOLR
2000.0000 mg | Freq: Once | INTRAVENOUS | Status: AC
  Administered 2019-08-04: 19:00:00 2000 mg via INTRAVENOUS
  Filled 2019-08-04: qty 2000

## 2019-08-04 MED ORDER — CHLORHEXIDINE GLUCONATE CLOTH 2 % EX PADS
6.0000 | MEDICATED_PAD | Freq: Every day | CUTANEOUS | Status: DC
  Administered 2019-08-04 – 2019-08-22 (×15): 6 via TOPICAL

## 2019-08-04 MED ORDER — SODIUM CHLORIDE 0.9 % IV SOLN
2.0000 g | Freq: Once | INTRAVENOUS | Status: AC
  Administered 2019-08-04: 18:00:00 2 g via INTRAVENOUS
  Filled 2019-08-04: qty 2

## 2019-08-04 MED ORDER — IOHEXOL 350 MG/ML SOLN
100.0000 mL | Freq: Once | INTRAVENOUS | Status: AC | PRN
  Administered 2019-08-04: 16:00:00 100 mL via INTRAVENOUS

## 2019-08-04 MED ORDER — METRONIDAZOLE IN NACL 5-0.79 MG/ML-% IV SOLN
500.0000 mg | Freq: Three times a day (TID) | INTRAVENOUS | Status: DC
  Administered 2019-08-04 – 2019-08-06 (×6): 500 mg via INTRAVENOUS
  Filled 2019-08-04 (×6): qty 100

## 2019-08-04 MED ORDER — FREE WATER
150.0000 mL | Freq: Three times a day (TID) | Status: DC
  Administered 2019-08-04 – 2019-08-14 (×29): 150 mL

## 2019-08-04 NOTE — Progress Notes (Signed)
ANTICOAGULATION CONSULT NOTE  Pharmacy Consult:  Heparin Indication:  DVT/PE  Allergies  Allergen Reactions  . Hydrocodone Nausea Only    Patient Measurements: Height: 5\' 11"  (180.3 cm) Weight: 222 lb 0.1 oz (100.7 kg) IBW/kg (Calculated) : 75.3 Heparin Dosing Weight: 96 kg  Vital Signs: Temp: 98.1 F (36.7 C) (09/10 1400) Temp Source: Oral (09/10 1400) BP: 149/79 (09/10 1410) Pulse Rate: 123 (09/10 1410)  Labs: Recent Labs    08/01/19 1805 08/01/19 2217  08/02/19 0222 08/03/19 0512 08/03/19 1400 08/04/19 0440  HGB  --   --    < > 11.9* 11.2*  --  10.3*  HCT  --   --   --  35.6* 34.3*  --  32.0*  PLT  --   --   --  198 185  --  204  HEPARINUNFRC  --   --   --   --  0.22* 0.35 0.22*  CREATININE  --   --   --  1.05 0.95  --  0.89  TROPONINIHS 8 10  --  13  --   --   --    < > = values in this interval not displayed.    Estimated Creatinine Clearance: 132.1 mL/min (by C-G formula based on SCr of 0.89 mg/dL).   Assessment: 77 YOM presented with acute ischemic CVA and did not receive tPA.  There was an incidental finding of DVT and IVC filter was placed on 07/21/19 as patient was not a candidate for [facility] due to craniectomy.  He was cleared to start IV heparin, but then stopped on 07/30/19 due to right frontal lobe hemorrhage.  CTA on 08/01/19 showed non-occlusive thrombus and repeat Doppler on 08/02/19 positive for bilateral DVTs.  Heparin restarted on 08/02/19 given extensive DVTs and PE.    Heparin level is supratherautic >2.20 after rate increase from 1250 to 1350 units/hr. After clarifying with RN who drew the blood, it appears that the level was drawn from the PICC line while heparin infusion was running. RN has been instructed to re-draw the heparin level by holding the infusion for 10 minutes, flushing the line, and then drawing blood.   Hgb 10.3, slightly lower from 11.2 yesterday. Plt stable at 204. No bleeding noted per RN.  Goal of Therapy:  Heparin level 0.3-0.5  units/ml Monitor platelets by anticoagulation protocol: Yes    Plan:   Repeat heparin level STAT  Berenice Bouton, PharmD PGY1 Pharmacy Resident Office phone: [phone] 08/04/2019, 3:16 PM

## 2019-08-04 NOTE — Progress Notes (Signed)
PROGRESS NOTE  Matthew Black [id number] DOB: 08-01-1976   PCP: Colon Branch, MD  Patient is from: home  DOA: 07/19/2019 LOS: 50  Brief Narrative / Interim history: "Matthew Black is an 43 y.o. male without significant known PMH who presented on 8/25 with AMS as well as L hemiparesis and aphasia.  UDS + for THC.  CT with large R hemispheric infarct with confluent cytotoxic edema in R ACA and MCA territories and mild mass effect.  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  He was admitted to [facility] with neurology consultation and started on 3% saline.  Neurosurgery was consulted and Dr. Kathyrn Sheriff recommended early decompressive hemicraniectomy, which was performed that night.   He subsequently developed hemorrhagic conversion with hematoma on 9/5; helmet was removed to further release intracranial pressure and heparin was discontinued. LE doppler also indicated DVT in R peroneal, L popliteal, L posterior tibial, and L peroneal veins; IVC filter was placed.     He has had seizure-like activity, for which he is on Keppra.  He was subsequently found to have possible RLL infiltrate and with concern for aspiration he was made NPO and started on Unasyn.  CTA was performed and it showed a non-occlusive LLL thrombus on 9/7.  Subsequent repeat DVT US was performed 9/8 and showed acute R DVT of the peroneal veins as well as acute L DVT involving common femoral vein, L femoral vein, L proximal profunda vein, L popliteal vein, L posterior tibial veins, and L peroneal veins - and it extends up into the left iliac vein and IVC".  Heparin resumed and no adverse effect so far.  Hematology consulted as well. Patient spiked fever to 102.1 on 9/9 while on Unasyn.  Had some leukocytosis which seems to have improved.  Also mild tachycardia.  Recent blood culture 9/7- so far.  No clear source of infection.  CT abdomen and pelvis, CXR, UA and urine culture ordered.    Subjective: Patient had fever  102.1 overnight.  Also tachycardic.  Otherwise, no major events.  Patient spontaneously moves his eyes, right upper and lower extremities but does not respond to voice or follows command.  Does not appear to be in distress.  Resisted eye exam.    Objective: Vitals:   08/04/19 1200 08/04/19 1300 08/04/19 1400 08/04/19 1410  BP: 120/85 134/82 (!) 153/76 (!) 149/79  Pulse: 99 (!) 111  (!) 123  Resp: 17 (!) 27 19 16   Temp: 98.3 F (36.8 C)  98.1 F (36.7 C)   TempSrc: Oral  Oral   SpO2: 100% 100%  96%  Weight:      Height:        Intake/Output Summary (Last 24 hours) at 08/04/2019 1601 Last data filed at 08/04/2019 1411 Gross per 24 hour  Intake 6261.69 ml  Output 2125 ml  Net 4136.69 ml   Filed Weights   08/01/19 0142 08/02/19 0500 08/04/19 0500  Weight: 98.2 kg 101.3 kg 100.7 kg    Examination:  GENERAL: No acute distress.  Does not responds to voice or follow command HEENT: MMM.  Cortrack in place.  Chronic ostomy staples over his head. NECK: Supple.  No apparent JVD.  RESP:  No IWOB. Good air movement bilaterally. CVS:  RRR. Heart sounds normal.  ABD/GI/GU: Bowel sounds present. Soft.  Honeycomb dressing over right abdomen DCI. MSK/EXT: Spontaneously moves right upper and lower extremities but not left. SKIN: Craniotomy staples on his head.  Honeycomb dressing  over his right abdomen DCI. NEURO: Awake, alert but does not respond to voice or follows command.  PERRLA.  Resists eye exam. PSYCH: Calm.   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  Heparin resumed without bolus  on 9/8. -H&H seems to be stable.  Closely monitor neuro status. -Neurology and neurosurgery managing.  Bilateral lower extremity DVT Nonocclusive LLL PE -IVC filter in place. -Heparin resumed on 9/8.  Hemoglobin dropped 1  g overnight-we will continue monitoring. -Hematology on board. -Follow hypercoagulable labs.  Seizure-like activity: -Continue Keppra per neurology  Fever and leukocytosis with bandemia: No clear source of infection. -Blood culture 9/7- so far.  Urinalysis negative. -Follow urine cultures and CT abdomen and pelvis with contrast. -Spiked fever to 102.1 on 9/9.  Leukocytosis downtrending. -Empiric Unasyn 9/6>>> for possible aspiration although chest x-ray do not reveal this. -If the above work-ups are negative, will curbside ID for insight. -May need broad-spectrum antibiotic for possible hospital-acquired infection  Elevated liver enzymes: Improving.  HIV negative. -Check acute hepatitis panel  Normocytic anemia: Hgb dropped 1 g overnight.  No obvious source of bleeding.  Dilution? -We will monitor while on heparin.  Dysphagia/metabolic encephalopathy likely due to massive CVA. -Tube feed via cortrack  DVT prophylaxis: On heparin drip Code Status: Full code Family Communication:  Available if any question. Disposition Plan: Remains in ICU Consultants: We are  Procedures:  Hemicraniectomy  Microbiology summarized: Urine cultures negative Blood cultures negative  Antimicrobials: Anti-infectives (From admission, onward)   Start     Dose/Rate Route Frequency Ordered Stop   07/31/19 2200  Ampicillin-Sulbactam (UNASYN) 3 g in sodium chloride 0.9 % 100 mL IVPB     3 g 200 mL/hr over 30 Minutes Intravenous Every 6 hours 07/31/19 1401     07/31/19 1415  Ampicillin-Sulbactam (UNASYN) 3 g in sodium chloride 0.9 % 100 mL IVPB     3 g 200 mL/hr over 30 Minutes Intravenous STAT 07/31/19 1401 07/31/19 1551   07/19/19 2315  bacitracin 50,000 Units in sodium chloride 0.9 % 500 mL irrigation  Status:  Discontinued       As needed 07/19/19 2315 07/19/19 2339      Sch Meds:  Scheduled Meds: . chlorhexidine  15 mL Mouth Rinse BID  . Chlorhexidine Gluconate Cloth  6 each Topical Daily   . famotidine  20 mg Per Tube BID  . feeding supplement (PRO-STAT SUGAR FREE 64)  30 mL Per Tube TID  . free water  150 mL Per Tube Q8H  . levETIRAcetam  500 mg Per Tube BID  . mouth rinse  15 mL Mouth Rinse q12n4p  . metoprolol tartrate  50 mg Per Tube Q8H  . sodium chloride flush  10-40 mL Intracatheter Q12H   Continuous Infusions: . sodium chloride 10 mL/hr at 08/01/19 1715  . sodium chloride 75 mL/hr at 08/04/19 1506  . ampicillin-sulbactam (UNASYN) IV 3 g (08/04/19 1507)  . feeding supplement (OSMOLITE 1.5 CAL) 60 mL/hr at 08/04/19 1400  . heparin 1,350 Units/hr (08/04/19 1109)   PRN Meds:.sodium chloride, acetaminophen **OR** acetaminophen (TYLENOL) oral liquid 160 mg/5 mL **OR** acetaminophen, iohexol, labetalol, senna-docusate, sodium chloride flush   I have personally reviewed the following labs and images: CBC: Recent Labs  Lab 07/30/19 0434 07/31/19 0715 08/01/19 0450 08/02/19 0222 08/03/19 0512 08/04/19 0440  WBC 16.1* 15.2*  13.5* 17.5* 16.4* 13.6*  NEUTROABS 11.5*  --   --   --   --   --   HGB 12.0* 13.8 12.2* 11.9* 11.2* 10.3*  HCT 36.5* 42.4 37.5* 35.6* 34.3* 32.0*  MCV 86.1 87.1 87.4 85.8 87.3 88.6  PLT 236 PLATELET CLUMPS NOTED ON SMEAR, UNABLE TO ESTIMATE 214 198 185 204   BMP &GFR Recent Labs  Lab 07/31/19 0715 08/01/19 0450 08/02/19 0222 08/03/19 0512 08/04/19 0440  NA 139 140 137 136 135  K 4.4 4.3 4.5 4.2 3.9  CL 103 104 105 104 103  CO2 25 25 24 22 22   GLUCOSE 126* 136* 142* 138* 130*  BUN 22* 23* 24* 24* 23*  CREATININE 0.95 1.14 1.05 0.95 0.89  CALCIUM 9.3 8.8* 8.6* 8.7* 8.4*  MG  --   --   --   --  2.1  PHOS  --   --   --   --  3.5   Estimated Creatinine Clearance: 132.1 mL/min (by C-G formula based on SCr of 0.89 mg/dL). Liver & Pancreas: Recent Labs  Lab 07/31/19 0715 08/02/19 0222 08/04/19 0440  AST 97* 63* 67*  ALT 174* 116* 80*  ALKPHOS 95 94 94  BILITOT 0.7 0.8 0.7  PROT 8.0 7.2 7.2  ALBUMIN 3.4* 3.0* 2.8*   No  results for input(s): LIPASE, AMYLASE in the last 168 hours. No results for input(s): AMMONIA in the last 168 hours. Diabetic: No results for input(s): HGBA1C in the last 72 hours. Recent Labs  Lab 08/04/19 0117 08/04/19 0443 08/04/19 0724 08/04/19 1148 08/04/19 1503  GLUCAP 121* 116* 117* 102* 113*   Cardiac Enzymes: No results for input(s): CKTOTAL, CKMB, CKMBINDEX, TROPONINI in the last 168 hours. No results for input(s): PROBNP in the last 8760 hours. Coagulation Profile: No results for input(s): INR, PROTIME in the last 168 hours. Thyroid Function Tests: No results for input(s): TSH, T4TOTAL, FREET4, T3FREE, THYROIDAB in the last 72 hours. Lipid Profile: No results for input(s): CHOL, HDL, LDLCALC, TRIG, CHOLHDL, LDLDIRECT in the last 72 hours. Anemia Panel: No results for input(s): VITAMINB12, FOLATE, FERRITIN, TIBC, IRON, RETICCTPCT in the last 72 hours. Urine analysis:    Component Value Date/Time   COLORURINE AMBER (A) 08/04/2019 0759   APPEARANCEUR CLEAR 08/04/2019 0759   LABSPEC 1.027 08/04/2019 0759   PHURINE 5.0 08/04/2019 0759   GLUCOSEU NEGATIVE 08/04/2019 0759   HGBUR NEGATIVE 08/04/2019 0759   BILIRUBINUR NEGATIVE 08/04/2019 0759   KETONESUR NEGATIVE 08/04/2019 0759   PROTEINUR 30 (A) 08/04/2019 0759   NITRITE NEGATIVE 08/04/2019 0759   LEUKOCYTESUR NEGATIVE 08/04/2019 0759   Sepsis Labs: Invalid input(s): PROCALCITONIN, [place]  Microbiology: Recent Results (from the past 240 hour(s))  Culture, blood (routine x 2)     Status: None (Preliminary result)   Collection Time: 07/30/19  1:09 PM   Specimen: BLOOD LEFT ARM  Result Value Ref Range Status   Specimen Description BLOOD LEFT ARM  Final   Special Requests   Final    BOTTLES DRAWN AEROBIC AND ANAEROBIC Blood Culture adequate volume   Culture   Final    NO GROWTH 4 DAYS Performed at [facility], [address] [place], [place] [address]    Report Status PENDING  Incomplete   Culture, blood (routine x 2)     Status: None (Preliminary result)   Collection Time: 07/30/19  1:09 PM   Specimen: BLOOD RIGHT HAND  Result Value Ref Range Status   Specimen Description BLOOD  RIGHT HAND  Final   Special Requests   Final    BOTTLES DRAWN AEROBIC ONLY Blood Culture results may not be optimal due to an inadequate volume of blood received in culture bottles   Culture   Final    NO GROWTH 4 DAYS Performed at [facility], [place] [address] [place], [place] [address]    Report Status PENDING  Incomplete  Culture, blood (Routine X 2) w Reflex to ID Panel     Status: None (Preliminary result)   Collection Time: 08/01/19  4:50 AM   Specimen: BLOOD  Result Value Ref Range Status   Specimen Description BLOOD PICC LINE  Final   Special Requests   Final    BOTTLES DRAWN AEROBIC ONLY Blood Culture adequate volume   Culture   Final    NO GROWTH 2 DAYS Performed at [facility], [place] [address] [place], [place] [address]    Report Status PENDING  Incomplete  Culture, blood (Routine X 2) w Reflex to ID Panel     Status: None (Preliminary result)   Collection Time: 08/01/19  4:50 AM   Specimen: BLOOD RIGHT HAND  Result Value Ref Range Status   Specimen Description BLOOD RIGHT HAND  Final   Special Requests   Final    BOTTLES DRAWN AEROBIC ONLY Blood Culture adequate volume   Culture   Final    NO GROWTH 2 DAYS Performed at [facility], [place] [address], [place], [place] [address]    Report Status PENDING  Incomplete    Radiology Studies: Dg Chest Port 1 View  Result Date: 08/04/2019 CLINICAL DATA:  Fever EXAM: PORTABLE CHEST 1 VIEW COMPARISON:  08/01/2019 chest radiograph. FINDINGS: Right PICC terminates over the right atrium. Weighted enteric tube tip is in body of the stomach. Stable cardiomediastinal silhouette with normal heart size. No pneumothorax. No pleural effusion. Lungs appear clear, with no acute consolidative airspace disease and no pulmonary  edema. IMPRESSION: Well-positioned support structures. No active cardiopulmonary disease. Electronically Signed   By: Ilona Sorrel M.D.   On: 08/04/2019 09:57     Enisa Runyan T. [place]  If 7PM-7AM, please contact night-coverage [email or web address] Password [email or web address] 08/04/2019, 4:01 PM

## 2019-08-04 NOTE — Progress Notes (Signed)
Pharmacy Antibiotic Note  Matthew Black is a 43 y.o. male admitted on 07/19/2019 with acute CVA with hemorrhagic conversion who continues with persistent fevers. The patient has been re-cultured and pharmacy consulted to broaden antibiotic coverage from Unasyn to Vancomycin + Cefepime + Flagyl.  Plan: - Vancomycin 2000 mg IV x 1 followed by 1250 mg IV every 12 hours (est AUC 491, SCr 0.89, Vd 0.5) - Cefepime 2g IV x 1 followed by 2g IV every 8 hours - Flagyl per MD - Will continue to follow renal function, culture results, LOT, and antibiotic de-escalation plans   Height: 5\' 11"  (180.3 cm) Weight: 222 lb 0.1 oz (100.7 kg) IBW/kg (Calculated) : 75.3  Temp (24hrs), Avg:99.5 F (37.5 C), Min:97.5 F (36.4 C), Max:102.1 F (38.9 C)  Recent Labs  Lab 07/31/19 0715 08/01/19 0450 08/01/19 0615 08/01/19 0929 08/02/19 0222 08/03/19 0512 08/04/19 0440  WBC 15.2* 13.5*  --   --  17.5* 16.4* 13.6*  CREATININE 0.95 1.14  --   --  1.05 0.95 0.89  LATICACIDVEN  --   --  1.2 2.0*  --   --   --     Estimated Creatinine Clearance: 132.1 mL/min (by C-G formula based on SCr of 0.89 mg/dL).    Allergies  Allergen Reactions  . Hydrocodone Nausea Only    Unasyn 9/6 >> 9/10 Vancomycin 9/10 >> Cefepime 9/10 >> Flagll 9/10 >>  8/25 BCx - negative 8/25 UCx - negative 8/25 covid/MRSA PCR - negative 9/5 BCx - NGTD 9/7 BCx - NGTD 9/10 BCx >> 9/10 UCx >>  Thank you for allowing pharmacy to be a part of this patient's care.  Alycia Rossetti, PharmD, BCPS Clinical Pharmacist Clinical phone for 08/04/2019: [phone] 08/04/2019 5:56 PM   **Pharmacist phone directory can now be found on [email or web address] ([email or web address]).  Listed under [place].

## 2019-08-04 NOTE — Progress Notes (Signed)
ANTICOAGULATION CONSULT NOTE  Pharmacy Consult: Heparin Indication: DVT/PE  Allergies  Allergen Reactions  . Hydrocodone Nausea Only    Patient Measurements: Height: 5\' 11"  (180.3 cm) Weight: 222 lb 0.1 oz (100.7 kg) IBW/kg (Calculated) : 75.3 Heparin Dosing Weight: 96 kg  Vital Signs: Temp: 100.3 F (37.9 C) (09/10 0400) Temp Source: Axillary (09/10 0400) BP: 121/81 (09/10 0500) Pulse Rate: 108 (09/10 0500)  Labs: Recent Labs    08/01/19 1805 08/01/19 2217 08/02/19 0222 08/03/19 0512 08/03/19 1400 08/04/19 0440  HGB  --   --  11.9* 11.2*  --   --   HCT  --   --  35.6* 34.3*  --   --   PLT  --   --  198 185  --   --   HEPARINUNFRC  --   --   --  0.22* 0.35 0.22*  CREATININE  --   --  1.05 0.95  --   --   TROPONINIHS 8 10 13   --   --   --     Estimated Creatinine Clearance: 123.8 mL/min (by C-G formula based on SCr of 0.95 mg/dL).  Assessment: Patient had an acute ischemic stroke and he did not receive tPA. Incidental finding of DVT and IVC filter was placed on 8/27 because patient was not a candidate for anticoagulation due to craniectomy. Was cleared for IV heparin; however, 9/5 hemorrhagic conversion with hematoma.   Hgb 11.9, plt 198. CT on 9/6 stable. On SQH for DVT ppx. CTA showing non-occlusive thrombus with L posterior lower lobe segmental artery. Okay per Neuro to start IV heparin - will target low end of goal and utilize no bolus approach.   9/10 AM update:  Heparin level low this AM, no issues per RN  Goal of Therapy:  Heparin level 0.3-0.5 units/mL  Monitor platelets by anticoagulation protocol: Yes   Plan:  Inc heparin drip to 1350 units/hr Re-check heparin level in 8 hours  Narda Bonds, PharmD, [facility] Pharmacist Phone: [phone]

## 2019-08-04 NOTE — Progress Notes (Signed)
ANTICOAGULATION CONSULT NOTE  Pharmacy Consult:  Heparin Indication:  DVT/PE  Allergies  Allergen Reactions  . Hydrocodone Nausea Only    Patient Measurements: Height: 5\' 11"  (180.3 cm) Weight: 222 lb 0.1 oz (100.7 kg) IBW/kg (Calculated) : 75.3 Heparin Dosing Weight: 96 kg  Vital Signs: Temp: 100.7 F (38.2 C) (09/10 1600) Temp Source: Oral (09/10 1600) BP: 134/75 (09/10 1700) Pulse Rate: 110 (09/10 1700)  Labs: Recent Labs    08/01/19 1805 08/01/19 2217  08/02/19 0222 08/03/19 0512  08/04/19 0440 08/04/19 1400 08/04/19 1631  HGB  --   --    < > 11.9* 11.2*  --  10.3*  --   --   HCT  --   --   --  35.6* 34.3*  --  32.0*  --   --   PLT  --   --   --  198 185  --  204  --   --   HEPARINUNFRC  --   --   --   --  0.22*   < > 0.22* >2.20* 0.43  CREATININE  --   --   --  1.05 0.95  --  0.89  --   --   TROPONINIHS 8 10  --  13  --   --   --   --   --    < > = values in this interval not displayed.    Estimated Creatinine Clearance: 132.1 mL/min (by C-G formula based on SCr of 0.89 mg/dL).   Assessment: 18 YOM presented with acute ischemic CVA and did not receive tPA.  There was an incidental finding of DVT and IVC filter was placed on 07/21/19 as patient was not a candidate for [facility] due to craniectomy.  He was cleared to start IV heparin, but then stopped on 07/30/19 due to right frontal lobe hemorrhage.  CTA on 08/01/19 showed non-occlusive thrombus and repeat Doppler on 08/02/19 positive for bilateral DVTs.  Heparin restarted on 08/02/19 given extensive DVTs and PE.    A repeat heparin level once drawn correctly was therapeutic (HL 0.43, goal of 0.3-0.5). Level drawn earlier this afternoon was falsely elevated in the setting of incorrect collection techniques. No bleeding or issues with the drip noted per RN  Goal of Therapy:  Heparin level 0.3-0.5 units/ml Monitor platelets by anticoagulation protocol: Yes    Plan:   - Continue Heparin at 1350 units/hr (13.5 ml/hr) - Will  continue to monitor for any signs/symptoms of bleeding and will follow up with heparin level in 6 hours to confirm therapeutic  Thank you for allowing pharmacy to be a part of this patient's care.  Alycia Rossetti, PharmD, BCPS Clinical Pharmacist Clinical phone for 08/04/2019: [phone] 08/04/2019 5:49 PM   **Pharmacist phone directory can now be found on [email or web address] ([email or web address]).  Listed under [place].

## 2019-08-04 NOTE — Progress Notes (Signed)
ANTICOAGULATION CONSULT NOTE  Pharmacy Consult: Heparin Indication: DVT/PE  Allergies  Allergen Reactions  . Hydrocodone Nausea Only    Patient Measurements: Height: 5\' 11"  (180.3 cm) Weight: 222 lb 0.1 oz (100.7 kg) IBW/kg (Calculated) : 75.3 Heparin Dosing Weight: 96 kg  Vital Signs: Temp: 102.9 F (39.4 C) (09/10 2341) Temp Source: Axillary (09/10 2341) BP: 136/85 (09/10 2200) Pulse Rate: 102 (09/10 2200)  Labs: Recent Labs    08/02/19 0222 08/03/19 0512  08/04/19 0440 08/04/19 1400 08/04/19 1631 08/04/19 2249  HGB 11.9* 11.2*  --  10.3*  --   --   --   HCT 35.6* 34.3*  --  32.0*  --   --   --   PLT 198 185  --  204  --   --   --   HEPARINUNFRC  --  0.22*   < > 0.22* >2.20* 0.43 0.43  CREATININE 1.05 0.95  --  0.89  --   --   --   TROPONINIHS 13  --   --   --   --   --   --    < > = values in this interval not displayed.    Estimated Creatinine Clearance: 132.1 mL/min (by C-G formula based on SCr of 0.89 mg/dL).  Assessment: Patient had an acute ischemic stroke and he did not receive tPA. Incidental finding of DVT and IVC filter was placed on 8/27 because patient was not a candidate for anticoagulation due to craniectomy. Was cleared for IV heparin; however, 9/5 hemorrhagic conversion with hematoma.   Hgb 11.9, plt 198. CT on 9/6 stable. On SQH for DVT ppx. CTA showing non-occlusive thrombus with L posterior lower lobe segmental artery. Okay per Neuro to start IV heparin - will target low end of goal and utilize no bolus approach.   9/10 PM update:  Heparin level therapeutic x 2  Goal of Therapy:  Heparin level 0.3-0.5 units/mL  Monitor platelets by anticoagulation protocol: Yes   Plan: Cont heparin drip at 1350 units/hr Daily CBC/HL Monitor for bleeding  Narda Bonds, PharmD, BCPS Clinical Pharmacist Phone: [phone]

## 2019-08-04 NOTE — Progress Notes (Signed)
STROKE TEAM PROGRESS NOTE   INTERVAL HISTORY Wife at bedside. Pt still has fever, overnight 102.1. also has chills and tachycardia during rounding. Dr. Cyndia Skeeters on board, has ordered CT abdomen and pelvis. Plan to change antibiotics to more broad spectrum. UA and CXR neg.   Vitals:   08/04/19 0700 08/04/19 0800 08/04/19 0900 08/04/19 1000  BP: 110/64 120/68 111/68 117/67  Pulse: 95 (!) 101 96 100  Resp: (!) 27 19 17  (!) 23  Temp:  99.9 F (37.7 C)    TempSrc:  Oral    SpO2: 100% 100% 100% 99%  Weight:      Height:        CBC:  Recent Labs  Lab 07/30/19 0434  08/03/19 0512 08/04/19 0440  WBC 16.1*   < > 16.4* 13.6*  NEUTROABS 11.5*  --   --   --   HGB 12.0*   < > 11.2* 10.3*  HCT 36.5*   < > 34.3* 32.0*  MCV 86.1   < > 87.3 88.6  PLT 236   < > 185 204   < > = values in this interval not displayed.    Basic Metabolic Panel:  Recent Labs  Lab 08/03/19 0512 08/04/19 0440  NA 136 135  K 4.2 3.9  CL 104 103  CO2 22 22  GLUCOSE 138* 130*  BUN 24* 23*  CREATININE 0.95 0.89  CALCIUM 8.7* 8.4*  MG  --  2.1  PHOS  --  3.5    IMAGING Ct Head Wo Contrast Ct Cervical Spine Wo Contrast  07/19/2019 1132 1. 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 2. No associated hemorrhage and mild intracranial mass effect at this time, including trace leftward midline shift. 3. Evidence of large vessel occlusion: Hyperdensity of the right ICA terminus, the right A1 and MCA. 4. Unaffected brain parenchyma appears negative. 5.  No acute traumatic injury identified in the cervical spine.   Mr Brain 18 Contrast Mr Angio Head Wo Contrast Mr Angio Neck Wo Contrast 07/19/2019 1330 1. 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 The right ICA terminus, right ACA and MCA also appear occluded. 2. 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 Cytotoxic edema but no convincing hemorrhage. Stable mild mass effect with trace  leftward midline shift. 3. Intracranial MRA is degraded by motion artifact, but no other large vessel occlusion is suspected. There appears to be lobes reperfusion of the right PCA. 4. The left hemisphere and posterior fossa brain parenchyma appears normal.   Ct Head Wo Contrast 07/19/2019 1532 Similar appearance to earlier. Low-density and swelling of the right hemisphere in the ACA and MCA territories with mass effect and right-to-left shift of 5-6 mm. No hemorrhagic transformation at this time.   Ct Head Wo Contrast 07/20/2019 0444 1. Acute right ACA and MCA territory infarct with progressive swelling that has decompressed through the craniectomy defect. 2. No acute hemorrhage or new infarction.   Ct Angio Head W Or Wo Contrast Ct Angio Neck W Or Wo Contrast 07/21/2019 1. Stable from prior MRA. 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 No evidence of atherosclerosis or vasculopathy in the other vessels. 2. Cytotoxic edema causes 5 mm of midline shift and brain bulging through the craniectomy defect. Mild petechial hemorrhage is seen at the basal ganglia.   Ct Head Wo Contrast 07/23/2019 1. Unchanged appearance of massive  right MCA and ACA territory infarcts with parenchyma extending through decompressive craniectomy. 2. Small focus of suspected hemorrhage adjacent to the right caudate head.  Ct Head Wo Contrast 07/30/2019 1. Decreasing mass effect within large right anterior MCA and ACA territory infarct. 2. Midline shift is no longer present. There is decreased effacement of the right lateral ventricle. 3. Infarcted brain tissue still herniates through the craniectomy site. 4. New parenchymal hemorrhage within the infarcted tissue anteriorly measures 2.5 x 2.3 x 2.7 cm. 5. No new infarct.  Ct Head Wo Contrast 08/01/2019  1. Stable head CT since 07/30/2019. 2. Again noted is a large infarct involving the right MCA and right ACA territories  with brain tissue herniating through the right craniotomy defect. 3. Parenchymal hemorrhage in the right anterior cortex has minimally changed. Stable petechial hemorrhage in the right basal ganglia region.   Dg Chest Port 1 View 08/04/2019 Well-positioned support structures. No active cardiopulmonary disease.  08/01/2019 No acute disease   Ct Angio Chest Pe W Or Wo Contrast 08/01/2019 Nonocclusive thrombus seen within the left posterior lower lobe segmental artery.    2D Echocardiogram 07/20/2019  1. The left ventricle has normal systolic function with an ejection fraction of 60-65%. The cavity size was normal. Left ventricular diastolic parameters were normal.  2. The right ventricle has normal systolic function. The cavity was normal. There is no increase in right ventricular wall thickness.  3. The pericardial effusion is circumferential.  4. Trivial pericardial effusion is present.  5. The mitral valve is grossly normal.  6. The tricuspid valve is grossly normal.  7. The aortic valve is tricuspid. No stenosis of the aortic valve.  8. The aorta is normal unless otherwise noted.  9. The aortic root is normal in size and structure. 10. No cardiac source of embolism identified. 11. When compared to the prior study: No comparison.  LE Dopplers 08/02/2019 Right: Findings consistent with acute deep vein thrombosis involving the right peroneal veins.  Left: Findings consistent with acute deep vein thrombosis involving the left common femoral vein, left femoral vein, left proximal profunda vein, left popliteal vein, left posterior tibial veins, and left peroneal veins. Extending up into left iliac vein  and IVC.  07/21/2019 Right: Findings consistent with acute deep vein thrombosis involving the right peroneal veins. Left: Findings consistent with acute deep vein thrombosis involving the left popliteal vein, left posterior tibial veins, and left peroneal veins.  Vas [place] Transcranial Doppler W  Bubbles 07/25/2019 No HITS heard heard at rest. No HITS heard heard during valsalva.   2D echo w/bubble 08/03/2019 Limited bubble study for shunt. No evidence for atrial level right to left shunt.  Ct Abdomen Pelvis W Contrast 08/04/2019 CLINICAL DATA:  43 year old male with history of acute onset of generalized abdominal pain with fever. EXAM: CT ABDOMEN AND PELVIS WITH CONTRAST TECHNIQUE: Multidetector CT imaging of the abdomen and pelvis was performed using the standard protocol following bolus administration of intravenous contrast. CONTRAST:  174mL OMNIPAQUE IOHEXOL 350 MG/ML SOLN COMPARISON:  No priors. FINDINGS: Lower chest: Central venous catheter terminating in the right atrium. Feeding tube extending into the distal stomach. Trace right pleural effusion lying dependently. Hepatobiliary: No suspicious cystic or solid hepatic lesions. No intra or extrahepatic biliary ductal dilatation. Gallbladder is nearly completely decompressed, but otherwise unremarkable in appearance. Pancreas: No pancreatic mass. No pancreatic ductal dilatation. No pancreatic or peripancreatic fluid collections or inflammatory changes. Spleen: Unremarkable. Adrenals/Urinary Tract: Subcentimeter low-attenuation lesion in the upper pole of the  left kidney, too small to characterize, but statistically likely to represent a cyst. Right kidney and bilateral adrenal glands are normal in appearance. No hydroureteronephrosis. Urinary bladder is normal in appearance. Stomach/Bowel: Feeding tube terminating in the antral pre-pyloric region of the stomach. Stomach is otherwise normal in appearance. No pathologic dilatation of small bowel or colon. Normal appendix. Vascular/Lymphatic: No significant atherosclerotic disease, aneurysm or dissection noted in the abdominal or pelvic vasculature. IVC filter in position with tip terminating below the level of the renal veins. No lymphadenopathy noted in the abdomen or pelvis. Reproductive:  Prostate gland and seminal vesicles are unremarkable in appearance. Other: Along the left pelvic sidewall there is a 2.9 x 4.5 cm high attenuation collection (axial image 91 of series 3), favored to represent a hematoma. Trace volume of ascites. No pneumoperitoneum. Musculoskeletal: In the right anterior abdominal wall superficial to the abdominal wall musculature within the subcutaneous fat there is a 14.2 x 4.7 x 11.0 cm intermediate attenuation (41 HU) collection which has some internal gas and small amount of peripheral enhancement. There is also what appears to be a surgical drain in place, although it is unclear whether this surgical drain communicates with the skin surface. Overlying skin staples are noted. There are no aggressive appearing lytic or blastic lesions noted in the visualized portions of the skeleton. IMPRESSION: 1. Large fluid collection in the lower right anterior abdominal wall subcutaneous fat with what appears to be an internal surgical drain, favored to represent a postoperative hematoma or proteinaceous seroma, however, the possibility of an abscess is not excluded in light of the patient's fever. 2. Small high attenuation fluid collection along the left pelvic sidewall which likely represents a hematoma, as above. 3. Additional incidental findings, as above. Electronically Signed   By: Vinnie Langton M.D.   On: 08/04/2019 18:30   Dg Chest Port 1 View 08/04/2019 CLINICAL DATA:  Fever EXAM: PORTABLE CHEST 1 VIEW COMPARISON:  08/01/2019 chest radiograph. FINDINGS: Right PICC terminates over the right atrium. Weighted enteric tube tip is in body of the stomach. Stable cardiomediastinal silhouette with normal heart size. No pneumothorax. No pleural effusion. Lungs appear clear, with no acute consolidative airspace disease and no pulmonary edema. IMPRESSION: Well-positioned support structures. No active cardiopulmonary disease. Electronically Signed   By: Ilona Sorrel M.D.   On: 08/04/2019  09:57     PHYSICAL EXAM   General - Well nourished, well developed, middle-aged African-American male.  He has right hemicraniectomy surgical incision on the scalp. Has chills during rounding  Ophthalmologic - fundi not visualized due to noncooperation.  Cardiovascular - Regular rhythm, tachycardia.  Neuro - patient is lethargic, chills, eyes intermittently open spontaneously. Nonverbal, but able to follow only limited simple commands on the right hand and foot today. Eyes in right gaze position, not blinking to visual threat to the left, PERRL. Left facial droop.  Tongue protrusion not corporative.  Has spontaneous movement of RUE against gravity 4/5 at least and RLE in bed 2/5 at least. On pain stimulation, mild withdraw of LLE, but no movement of LUE. DTR 1+ and no babinski. Sensation, coordination and gait not tested.   ASSESSMENT/PLAN Matthew Black is a 43 y.o. male with no significant past medical history found down x 2 days nonverbal with L hemiparesis.   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 Trace L midline shift. ELVO at R ICA, R A1, R MCA.  MRI  Large R brain infarct sparing R PCA. Cytotoxic edema but no hemorrhage. Stable trace midline shift.  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 R ICA terminus, R ACA, R MCA occluded.   CT head similar w/ low density and sweddling R ACA and MCA territories, now with 5-45mm midline shift. no hemorrhage  CT head acute R ACA and MCA infarct w/ progressive swelling decompressed through craniectomy defect.   CTA head and neck stable MLS. R ICA occlusion in neck that continues into R ACA and MCA. Mild petechial hemorrhage at basal ganglia  CT Head 8/29 - Unchanged appearance of massive right MCA and ACA territory infarcts with  parenchyma extending through decompressive craniectomy. Small focus of suspected hemorrhage adjacent to the right caudate head.  CT Head WO - 07/30/19 - New hematoma within the infarcted tissue   CT repeat 08/01/2019 stable w/o change  2D Echo EF 60-65%  2D echo w/ bubble ordered no LRS   LE venous doppler DVT in R peroneal, L popliteal, L posterior tibial and L peroneal  LE venous doppler repeat DVT in R peroneal, L popliteal, L posterior tibial and L peroneal  CT chest Nonocclusive thrombus seen within the left posterior lower lobe segmental artery.   TCD bubble no HITS, no PFO  LDL 83  HgbA1c 5.4  UDS positive THC  Hypercoagulable and autoimmune work up negative  Heparin 1500 subq for VTE prophylaxis  No antithrombotic prior to admission, treated DVT with heparin IV, then  discontinued due to hemorrhagic conversion with right frontal hematoma, now resumed heparin IV given extensive DVT and PE  Therapy recommendations:  SNF  Disposition:  pending   Cyctotoxic cerebral edema  S/p R decompressive hemicraniectomy (Nundkumar) w/ flap R abd 07/19/2019  PICC placed - keep for now per Leonie Man  Given One dose of 23.4% 8/26; 3%  off 8/30 1700  Na 146 -> 140->135  On NS to 75cc and decrease free water to 150 Q8  Monitor Na daily  CT repeat 07/30/19 - New parenchymal hemorrhage within the infarcted tissue  Off helmet to further release pressure  CT repeat 08/01/2019 stable w/o change  Close neuro check since on heparin IV  Right ICA occlusion  MRA and CTA showed right ICA occlusion from origin to terminal  Wife denies any head trauma  Wife stated that pt had recent aggressive exercise with weight lifting  Concerning dissection as working diagnosis of stroke etiology   B LE DVT Small LLL PE  LE venous doppler DVT in R peroneal, L popliteal, L posterior tibial and L peroneal  Etiology unclear  Started iv heparin 07/26/2019  IVC filter placed 8/27. Plan retrieval in  8-12 weeks as an IP in an IR clinic  Hypercoagulable work up negative   Off SCDs  LE venous doppler 08/02/19 repeat extensive DVT in R peroneal, L popliteal, L posterior tibial and L peroneal up to the IVC filter  CT chest Nonocclusive thrombus seen within the left posterior lower lobe segmental artery.   hydrated w/ IVF @ 75cc, continue free water and TF  Treated with IV Heparin but then off secondary to intracranial hemorrhage  Restarted IV heparin given extensive DVT and PE with close neuro monitoring. Pt remains stable.  Hematology consult, appreciate help - hypercoag w/u neg. Will add Factor V Leiden. At risk for  recurrent clots. AC recommended w/ lovenox at d/c.    Appreciate TRH assistance on this complicated case  Abdominal wall fluid collection  Large fluid collection seen on CT abdomen  ? Hematoma ? Abscess  Discussed with Dr. Kathyrn Sheriff who will review CT and consider washout the wound cavity if needed.   Discussed with Dr. Cyndia Skeeters who has already changed Unasyn to broad spectrum abx including vanco, cefepime and flagyl  May consider ID consult in am  Seizure-like activity  Continue Keppra   EEG continuous slowing, excessive beta activity, sleep spindle asymmetry decreased R->related to stroke and sedation. No SZ  Long Term EEG cortical dysfunction in right frontal region  Seizure precautions  Febrile, Leukocytosis  Tmax - 101.8->99.7->101.8->101.5->99.6->98.3-100.8-102.1   WBC - 11.8->14.5->16.1->13.5->17.5->16.4-13.6   CXR x 2 NAD  U/A x 3 - negative  Blood cultures - NGTD, repeat BCx no growth 2 d  on empiric unasyn -> changed to vanco, cefepime and flagyl  Lactic acid 1.2->2.0  CT abd/pel concerning for abdominal wall fluid collection as above  Tachycardia  HR 98 - 120  likely due to fever and dehydration and PE  Hydration with IVF, TF and free water  Increase metoprolol to 50 Q8h  EKG ST, rate 120 w/ possible LA enlargement  Troponin  series neg x 3    Still more likely due to fever and SIRS  Hyperlipidemia  Home meds:  no statin  LDL 83, goal < 70  Was on lipitor 20 mg daily   D/c statin due to elevated LFT  Dysphagia . Secondary to stroke . NPO  Cortrak w/ TF @ 55cc  Decreased free water 200cc q4->q8   Speech on board - continue NPO  Other Stroke Risk Factors  Former Cigarette smoker, quit 3 yrs ago  ETOH use  Substance abuse UDS - positive for THC   Obesity, Body mass index is 30.96 kg/m., recommend weight loss, diet and exercise as appropriate   Other Active Problems  Elevated LFTs -  AST - 132->219->97->67 ; ALT - 68->244->174->116->80 - improving   Hypokalemia - 3.5->3.4 - supplement ->3.5- 3.2->3.4 - replace - 4.5-4.2-3.9   Normocytic anemia - hematology felt d/t ongoing blood draws 11.9-11.2-10.3  Hospital day # 16  This patient is critically ill due to right large infarct, cerebral edema, hemorrhagic conversion, extensive DVT, PE, tachycardia, right carotid occlusion and at significant risk of neurological worsening, death form recurrent stroke, brain herniation, hemorrhagic conversion, PE, heart failure, respiratory failure, status epilepticus. This patient's care requires constant monitoring of vital signs, hemodynamics, respiratory and cardiac monitoring, review of multiple databases, neurological assessment, discussion with family, other specialists and medical decision making of high complexity. I spent 35 minutes of neurocritical care time in the care of this patient. I had long discussion with wife at bedside, updated pt current condition, treatment plan and potential prognosis. I also discussed with Dr. Kathyrn Sheriff and Dr. Cyndia Skeeters.     Rosalin Hawking, MD PhD Stroke Neurology 08/04/2019 10:10 AM   To contact Stroke Continuity provider, please refer to [email or web address] After hours, contact General Neurology

## 2019-08-04 NOTE — Progress Notes (Signed)
  Speech Language Pathology Treatment: Dysphagia;Cognitive-Linquistic  Patient Details Name: Matthew Black MRN: [id number] DOB: 1976-03-04 Today's Date: 08/04/2019 Time: 1350-1405 SLP Time Calculation (min) (ACUTE ONLY): 15 min  Assessment / Plan / Recommendation Clinical Impression  Wife at bedside.  Pt lethargic; arousable for brief periods in order to reassess swallow.  Did not follow commands during our session.  Oral care provided.  Pt maintained eyes closed, but accepted single ice chips, demonstrating active mastication, adequate lip seal.  Required max multimodal cues to swallow and re-swallow; laryngeal movement palpable but unable to ascertain if complete. No coughing; no voicing. Continue NPO with cortrak.  Pt will benefit from instrumental swallow eval; likely early next week.  D/W pt's wife; she verbalizes understanding.    HPI HPI: Pt is a 43 y.o. with no known PMH who presents on 8/25 after being found down at work, nonverbal with L hemiparesis. 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 Stable trace midline shift. S/p hemicraniectomy with abdominal flap implant. Intubated 8/26-8/31. CXR worsened atelectasis in the right lower lobe.      SLP Plan  Continue with current plan of care       Recommendations  Diet recommendations: NPO Medication Administration: Via alternative means                Oral Care Recommendations: Oral care QID Follow up Recommendations: 24 hour supervision/assistance;Inpatient Rehab SLP Visit Diagnosis: Dysphagia, unspecified (R13.10);Cognitive communication deficit (R41.841) Plan: Continue with current plan of care       [place]. Tivis Ringer, [place] Office number [phone] Pager [phone]   Matthew Black 08/04/2019, 2:18 PM

## 2019-08-04 NOTE — Progress Notes (Signed)
Inpatient Rehabilitation Admissions Coordinator  Therapy recommending SNF level rehab. I will follow at a distance.  Danne Baxter, RN, MSN Rehab Admissions Coordinator [phone] 08/04/2019 11:52 AM

## 2019-08-05 DIAGNOSIS — Z86718 Personal history of other venous thrombosis and embolism: Secondary | ICD-10-CM

## 2019-08-05 DIAGNOSIS — R188 Other ascites: Secondary | ICD-10-CM

## 2019-08-05 DIAGNOSIS — A419 Sepsis, unspecified organism: Secondary | ICD-10-CM

## 2019-08-05 DIAGNOSIS — Z960 Presence of urogenital implants: Secondary | ICD-10-CM

## 2019-08-05 DIAGNOSIS — Z95828 Presence of other vascular implants and grafts: Secondary | ICD-10-CM

## 2019-08-05 DIAGNOSIS — X58XXXA Exposure to other specified factors, initial encounter: Secondary | ICD-10-CM

## 2019-08-05 DIAGNOSIS — S301XXA Contusion of abdominal wall, initial encounter: Secondary | ICD-10-CM

## 2019-08-05 LAB — CBC
HCT: 29.9 % — ABNORMAL LOW (ref 39.0–52.0)
Hemoglobin: 9.4 g/dL — ABNORMAL LOW (ref 13.0–17.0)
MCH: 27.5 pg (ref 26.0–34.0)
MCHC: 31.4 g/dL (ref 30.0–36.0)
MCV: 87.4 fL (ref 80.0–100.0)
Platelets: 222 10*3/uL (ref 150–400)
RBC: 3.42 MIL/uL — ABNORMAL LOW (ref 4.22–5.81)
RDW: 13.5 % (ref 11.5–15.5)
WBC: 12.7 10*3/uL — ABNORMAL HIGH (ref 4.0–10.5)
nRBC: 0 % (ref 0.0–0.2)

## 2019-08-05 LAB — HEMOGLOBINOPATHY EVALUATION
Hgb A2 Quant: 2.2 % (ref 1.8–3.2)
Hgb A: 97.8 % (ref 96.4–98.8)
Hgb C: 0 %
Hgb F Quant: 0 % (ref 0.0–2.0)
Hgb S Quant: 0 %
Hgb Variant: 0 %

## 2019-08-05 LAB — GLUCOSE, CAPILLARY
Glucose-Capillary: 118 mg/dL — ABNORMAL HIGH (ref 70–99)
Glucose-Capillary: 108 mg/dL — ABNORMAL HIGH (ref 70–99)
Glucose-Capillary: 111 mg/dL — ABNORMAL HIGH (ref 70–99)
Glucose-Capillary: 126 mg/dL — ABNORMAL HIGH (ref 70–99)
Glucose-Capillary: 127 mg/dL — ABNORMAL HIGH (ref 70–99)
Glucose-Capillary: 129 mg/dL — ABNORMAL HIGH (ref 70–99)

## 2019-08-05 LAB — URINE CULTURE: Culture: NO GROWTH

## 2019-08-05 LAB — HEPARIN LEVEL (UNFRACTIONATED): Heparin Unfractionated: 0.45 IU/mL (ref 0.30–0.70)

## 2019-08-05 LAB — MAGNESIUM: Magnesium: 2.1 mg/dL (ref 1.7–2.4)

## 2019-08-05 LAB — PHOSPHORUS: Phosphorus: 3.7 mg/dL (ref 2.5–4.6)

## 2019-08-05 LAB — HOMOCYSTEINE: Homocysteine: 9 umol/L (ref 0.0–14.5)

## 2019-08-05 MED ORDER — LACTATED RINGERS IV BOLUS
1000.0000 mL | Freq: Once | INTRAVENOUS | Status: AC
  Administered 2019-08-05: 1000 mL via INTRAVENOUS

## 2019-08-05 MED ORDER — KETOROLAC TROMETHAMINE 30 MG/ML IJ SOLN
30.0000 mg | Freq: Once | INTRAMUSCULAR | Status: AC
  Administered 2019-08-05: 30 mg via INTRAVENOUS
  Filled 2019-08-05: qty 1

## 2019-08-05 NOTE — Progress Notes (Signed)
STROKE TEAM PROGRESS NOTE   INTERVAL HISTORY Pt continued fever and chills on broad spectrum antibiotics. CT abd/pel showed fluid collection around bone flap in the belly. Dr. Kathyrn Sheriff contacted and aspiration showed old hematoma component which has sent for culture. Not infectious source at this time. Will have ID on board. Dr. Cyndia Skeeters has contacted ID already. Appreciate help.    Vitals:   08/05/19 0600 08/05/19 0700 08/05/19 0800 08/05/19 0900  BP: 130/78 130/80 (!) 137/118 (!) 145/96  Pulse: 100 (!) 102 (!) 116 (!) 122  Resp: (!) 23 (!) 22 20 (!) 23  Temp:   (!) 101.3 F (38.5 C)   TempSrc:   Rectal   SpO2: 99% 100% 100% 94%  Weight:      Height:        CBC:  Recent Labs  Lab 07/30/19 0434  08/04/19 0440 08/05/19 0457  WBC 16.1*   < > 13.6* 12.7*  NEUTROABS 11.5*  --   --   --   HGB 12.0*   < > 10.3* 9.4*  HCT 36.5*   < > 32.0* 29.9*  MCV 86.1   < > 88.6 87.4  PLT 236   < > 204 222   < > = values in this interval not displayed.    Basic Metabolic Panel:  Recent Labs  Lab 08/03/19 0512 08/04/19 0440 08/05/19 0457  NA 136 135  --   K 4.2 3.9  --   CL 104 103  --   CO2 22 22  --   GLUCOSE 138* 130*  --   BUN 24* 23*  --   CREATININE 0.95 0.89  --   CALCIUM 8.7* 8.4*  --   MG  --  2.1 2.1  PHOS  --  3.5 3.7    IMAGING Ct Head Wo Contrast Ct Cervical Spine Wo Contrast  07/19/2019 1132 1. 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 2. No associated hemorrhage and mild intracranial mass effect at this time, including trace leftward midline shift. 3. Evidence of large vessel occlusion: Hyperdensity of the right ICA terminus, the right A1 and MCA. 4. Unaffected brain parenchyma appears negative. 5.  No acute traumatic injury identified in the cervical spine.   Mr Brain 53 Contrast Mr Angio Head Wo Contrast Mr Angio Neck Wo Contrast 07/19/2019 1330 1. 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 The right ICA  terminus, right ACA and MCA also appear occluded. 2. 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 Cytotoxic edema but no convincing hemorrhage. Stable mild mass effect with trace leftward midline shift. 3. Intracranial MRA is degraded by motion artifact, but no other large vessel occlusion is suspected. There appears to be lobes reperfusion of the right PCA. 4. The left hemisphere and posterior fossa brain parenchyma appears normal.   Ct Head Wo Contrast 07/19/2019 1532 Similar appearance to earlier. Low-density and swelling of the right hemisphere in the ACA and MCA territories with mass effect and right-to-left shift of 5-6 mm. No hemorrhagic transformation at this time.   Ct Head Wo Contrast 07/20/2019 0444 1. Acute right ACA and MCA territory infarct with progressive swelling that has decompressed through the craniectomy defect. 2. No acute hemorrhage or new infarction.   Ct Angio Head W Or Wo Contrast Ct Angio Neck W Or Wo Contrast 07/21/2019 1. Stable from prior MRA. There is right ICA occlusion in the neck that continues into the right ACA  and MCA vessels. No evidence of atherosclerosis or vasculopathy in the other vessels. 2. Cytotoxic edema causes 5 mm of midline shift and brain bulging through the craniectomy defect. Mild petechial hemorrhage is seen at the basal ganglia.   Ct Head Wo Contrast 07/23/2019 1. Unchanged appearance of massive right MCA and ACA territory infarcts with parenchyma extending through decompressive craniectomy. 2. Small focus of suspected hemorrhage adjacent to the right caudate head.  Ct Head Wo Contrast 07/30/2019 1. Decreasing mass effect within large right anterior MCA and ACA territory infarct. 2. Midline shift is no longer present. There is decreased effacement of the right lateral ventricle. 3. Infarcted brain tissue still herniates through the craniectomy site. 4. New parenchymal hemorrhage within the infarcted tissue anteriorly  measures 2.5 x 2.3 x 2.7 cm. 5. No new infarct.  Ct Head Wo Contrast 08/01/2019  1. Stable head CT since 07/30/2019. 2. Again noted is a large infarct involving the right MCA and right ACA territories with brain tissue herniating through the right craniotomy defect. 3. Parenchymal hemorrhage in the right anterior cortex has minimally changed. Stable petechial hemorrhage in the right basal ganglia region.   Dg Chest Port 1 View 08/04/2019 Well-positioned support structures. No active cardiopulmonary disease.  08/01/2019 No acute disease   Ct Angio Chest Pe W Or Wo Contrast 08/01/2019 Nonocclusive thrombus seen within the left posterior lower lobe segmental artery.    2D Echocardiogram 07/20/2019  1. The left ventricle has normal systolic function with an ejection fraction of 60-65%. The cavity size was normal. Left ventricular diastolic parameters were normal.  2. The right ventricle has normal systolic function. The cavity was normal. There is no increase in right ventricular wall thickness.  3. The pericardial effusion is circumferential.  4. Trivial pericardial effusion is present.  5. The mitral valve is grossly normal.  6. The tricuspid valve is grossly normal.  7. The aortic valve is tricuspid. No stenosis of the aortic valve.  8. The aorta is normal unless otherwise noted.  9. The aortic root is normal in size and structure. 10. No cardiac source of embolism identified. 11. When compared to the prior study: No comparison.  LE Dopplers 08/02/2019 Right: Findings consistent with acute deep vein thrombosis involving the right peroneal veins.  Left: Findings consistent with acute deep vein thrombosis involving the left common femoral vein, left femoral vein, left proximal profunda vein, left popliteal vein, left posterior tibial veins, and left peroneal veins. Extending up into left iliac vein  and IVC.  07/21/2019 Right: Findings consistent with acute deep vein thrombosis involving the  right peroneal veins. Left: Findings consistent with acute deep vein thrombosis involving the left popliteal vein, left posterior tibial veins, and left peroneal veins.  Vas [place] Transcranial Doppler W Bubbles 07/25/2019 No HITS heard heard at rest. No HITS heard heard during valsalva.   2D echo w/bubble 08/03/2019 Limited bubble study for shunt. No evidence for atrial level right to left shunt.  Ct Abdomen Pelvis W Contrast 08/04/2019 1. Large fluid collection in the lower right anterior abdominal wall subcutaneous fat with what appears to be an internal surgical drain, favored to represent a postoperative hematoma or proteinaceous seroma, however, the possibility of an abscess is not excluded in light of the patient's fever. 2. Small high attenuation fluid collection along the left pelvic sidewall which likely represents a hematoma, as above. 3. Additional incidental findings, as above.   PHYSICAL EXAM   General - Well nourished, well developed, middle-aged African-American male.  He has right hemicraniectomy surgical incision on the scalp. Has chills during rounding  Ophthalmologic - fundi not visualized due to noncooperation.  Cardiovascular - Regular rhythm, tachycardia.  Neuro - patient is wake, still has chills, eyes open spontaneously. Nonverbal, but able to follow all simple commands on the right hand and foot. Eyes in right gaze position, barely cross midline, not blinking to visual threat to the left, PERRL. Left facial droop.  Tongue protrusion not corporative.  Has spontaneous movement of RUE against gravity 4/5 at least and RLE in bed 2+/5 at least. On pain stimulation, mild withdraw of LLE, but no movement of LUE. DTR 1+ and no babinski. Sensation, coordination and gait not tested.   ASSESSMENT/PLAN Matthew Black is a 43 y.o. male with no significant past medical history found down x 2 days nonverbal with L hemiparesis.   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 Trace L midline shift. ELVO at R ICA, R A1, R MCA.  MRI  Large R brain infarct sparing R PCA. Cytotoxic edema but no hemorrhage. Stable trace midline shift.  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 R ICA terminus, R ACA, R MCA occluded.   CT head similar w/ low density and sweddling R ACA and MCA territories, now with 5-39mm midline shift. no hemorrhage  CT head acute R ACA and MCA infarct w/ progressive swelling decompressed through craniectomy defect.   CTA head and neck stable MLS. R ICA occlusion in neck that continues into R ACA and MCA. Mild petechial hemorrhage at basal ganglia  CT Head 8/29 - Unchanged appearance of massive right MCA and ACA territory infarcts with parenchyma extending through decompressive craniectomy. Small focus of suspected hemorrhage adjacent to the right caudate head.  CT Head WO - 07/30/19 - New hematoma within the infarcted tissue   CT repeat 08/01/2019 stable w/o change  2D Echo EF 60-65%  2D echo w/ bubble ordered no LRS   LE venous doppler DVT in R peroneal, L popliteal, L posterior tibial and L peroneal  LE venous doppler repeat DVT in R peroneal, L popliteal, L posterior tibial and L peroneal  CT chest Nonocclusive thrombus seen within the left posterior lower lobe segmental artery.   TCD bubble no HITS, no PFO  LDL 83  HgbA1c 5.4  UDS positive THC  Hypercoagulable and autoimmune work up negative  Heparin 1500 subq for VTE prophylaxis  No antithrombotic prior to admission, treated DVT with heparin IV, then  discontinued due to hemorrhagic conversion with right frontal hematoma, now resumed heparin IV given extensive DVT and PE  Therapy recommendations:  SNF  Disposition:  pending   Cyctotoxic cerebral edema  S/p R decompressive  hemicraniectomy (Nundkumar) w/ flap R abd 07/19/2019  PICC placed - keep for now per Leonie Man  Given One dose of 23.4% 8/26; 3%  off 8/30 1700  Na 146 -> 140->135   On NS to 75cc and decrease free water to 150 Q8  Monitor Na daily  CT repeat 07/30/19 - New parenchymal hemorrhage within the infarcted tissue  Off helmet to further release pressure  CT repeat 08/01/2019 stable w/o change  Close neuro check since on heparin IV  Right ICA occlusion  MRA and CTA showed right ICA occlusion from  origin to terminal  Wife denies any head trauma  Wife stated that pt had recent aggressive exercise with weight lifting  Concerning dissection as working diagnosis of stroke etiology   B LE DVT Small LLL PE  LE venous doppler DVT in R peroneal, L popliteal, L posterior tibial and L peroneal  Etiology unclear  Started iv heparin 07/26/2019  IVC filter placed 8/27. Plan retrieval in 8-12 weeks as an IP in an IR clinic  Hypercoagulable work up negative   Off SCDs  LE venous doppler 08/02/19 repeat extensive DVT in R peroneal, L popliteal, L posterior tibial and L peroneal up to the IVC filter  CT chest Nonocclusive thrombus seen within the left posterior lower lobe segmental artery.   hydrated w/ IVF @ 75cc, continue free water and TF  Treated with IV Heparin but then off secondary to intracranial hemorrhage  Restarted IV heparin given extensive DVT and PE with close neuro monitoring. Pt remains stable.  Hematology consult, appreciate help - hypercoag w/u neg.  At risk for recurrent clots. AC recommended w/ lovenox at d/c. Factor V Leiden, homocysteine, JAK2, Prot C&S, Prothombin gene mutation  pending   Appreciate TRH assistance on this complicated case  Abdominal wall fluid collection  Large fluid collection seen on CT abdomen  Dr. Kathyrn Sheriff did fluid aspiration and seems to be old blood component  Fluid aspiration sent for culture.  requested ID consult   Seizure-like  activity  Continue Keppra   EEG continuous slowing, excessive beta activity, sleep spindle asymmetry decreased R->related to stroke and sedation. No SZ  Long Term EEG cortical dysfunction in right frontal region  Seizure precautions  Febrile, Leukocytosis  Tmax - 101.8->99.7->101.8->101.5->99.6->98.3-100.8-102.1-103.9   WBC - 11.8->14.5->16.1->13.5->17.5->16.4-13.6-12.7    CXR x 2 NAD  U/A x 3 - negative. UCx neg   Blood cultures - neg, repeated again 08/04/19    on empiric unasyn -> changed to vanco, cefepime and flagyl  Lactic acid 1.2->2.0  CT abd/pel concerning for abdominal wall fluid collection likely old hematoma  Hepatitis panel pending   Tachycardia  HR 98 - 120  likely due to fever and dehydration and PE  Hydration with IVF, TF and free water  Increase metoprolol to 50 Q8h  EKG ST, rate 120 w/ possible LA enlargement  Troponin series neg x 3    Still more likely due to fever and SIRS  Hyperlipidemia  Home meds:  no statin  LDL 83, goal < 70  Was on lipitor 20 mg daily   D/c statin due to elevated LFT  Dysphagia . Secondary to stroke . NPO  Cortrak w/ TF @ 55cc  Decreased free water 150cc q4->q8   Speech on board - continue NPO  Other Stroke Risk Factors  Former Cigarette smoker, quit 3 yrs ago  ETOH use  Substance abuse UDS - positive for THC   Obesity, Body mass index is 32.38 kg/m., recommend weight loss, diet and exercise as appropriate   Other Active Problems  Elevated LFTs -  AST - 132->219->97->67 ; ALT - 68->244->174->116->80 - improving   Hypokalemia - 3.5->3.4 - supplement ->3.5- 3.2->3.4 - replace - 4.5-4.2-3.9   Normocytic anemia - hematology felt d/t ongoing blood draws 11.9-11.2-10.3->9.4   Hospital day # 17  This patient is critically ill due to right large infarct, cerebral edema, hemorrhagic conversion, extensive DVT, PE, tachycardia, right carotid occlusion and at significant risk of neurological  worsening, death form recurrent stroke, brain herniation, hemorrhagic conversion, PE, heart failure, respiratory  failure, status epilepticus. This patient's care requires constant monitoring of vital signs, hemodynamics, respiratory and cardiac monitoring, review of multiple databases, neurological assessment, discussion with family, other specialists and medical decision making of high complexity. I spent 40 minutes of neurocritical care time in the care of this patient. I also discussed with Dr. Kathyrn Sheriff and Dr. Cyndia Skeeters.     Rosalin Hawking, MD PhD Stroke Neurology 08/05/2019 10:30 AM   To contact Stroke Continuity provider, please refer to [email or web address] After hours, contact General Neurology

## 2019-08-05 NOTE — Progress Notes (Signed)
CTSP regarding fluid around subcutaneous abdominal bone flap. Pt presenting with fever, tachycardia. On exam, wound clean, dry, no erythema, no purulent drainage. CT abd/pelvis was reviewed which appears to suggest subcutaneous hematoma rather than abscess. Under sterile conditions, subcutaneous collection was percutaneously aspirated and thin chronic hematoma was obtained, no pus. Specimen was sent for gram stain and culture. As the fluid appears to be old hematoma I do not see a need for surgical exploration/washout as this would necessitate discard of the bone flap and need for custom fabrication of bone flap for cranioplasty.

## 2019-08-05 NOTE — Progress Notes (Addendum)
ANTICOAGULATION CONSULT NOTE  Pharmacy Consult:  Heparin Indication:  DVT/PE  Allergies  Allergen Reactions  . Hydrocodone Nausea Only    Patient Measurements: Height: 5\' 11"  (180.3 cm) Weight: 232 lb 2.3 oz (105.3 kg) IBW/kg (Calculated) : 75.3 Heparin Dosing Weight: 96 kg  Vital Signs: Temp: 101 F (38.3 C) (09/11 0500) Temp Source: Rectal (09/11 0500) BP: 130/78 (09/11 0600) Pulse Rate: 100 (09/11 0600)  Labs: Recent Labs    08/03/19 0512  08/04/19 0440  08/04/19 1631 08/04/19 2249 08/05/19 0457  HGB 11.2*  --  10.3*  --   --   --  9.4*  HCT 34.3*  --  32.0*  --   --   --  29.9*  PLT 185  --  204  --   --   --  222  HEPARINUNFRC 0.22*   < > 0.22*   < > 0.43 0.43 0.45  CREATININE 0.95  --  0.89  --   --   --   --    < > = values in this interval not displayed.    Estimated Creatinine Clearance: 134.9 mL/min (by C-G formula based on SCr of 0.89 mg/dL).   Assessment: 47 YOM presented with acute ischemic CVA and did not receive tPA.  There was an incidental finding of DVT and IVC filter was placed on 07/21/19 as patient was not a candidate for [facility] due to craniectomy.  He was cleared to start IV heparin, but then stopped on 07/30/19 due to right frontal lobe hemorrhage.  CTA on 08/01/19 showed non-occlusive thrombus and repeat Doppler on 08/02/19 positive for bilateral DVTs.  Heparin restarted on 08/02/19 given extensive DVTs and PE.    Heparin level therapeutic at 0.45. Hgb/Hcg 9.4/29.9, Plt 222. CT A/P showed large fluid collection on abdominal wall that could represent post-op hematoma. No other bleeding noted.  Goal of Therapy:  Heparin level 0.3-0.5 units/ml Monitor platelets by anticoagulation protocol: Yes    Plan:   - Continue Heparin at 1350 units/hr (13.5 ml/hr) - Daily heparin level and CBC - Will continue to monitor for any signs/symptoms of bleeding   Thank you for allowing pharmacy to be a part of this patient's care.  Berenice Bouton, PharmD PGY1 Pharmacy  Resident Office phone: [phone] 08/05/2019

## 2019-08-05 NOTE — Progress Notes (Signed)
Patient spouse asked for attending physician to contact her.

## 2019-08-05 NOTE — Progress Notes (Addendum)
PROGRESS NOTE  Matthew Black [id number] DOB: May 19, 1976   PCP: Colon Branch, MD  Patient is from: home  DOA: 07/19/2019 LOS: 17  Brief Narrative / Interim history: "Matthew Black is an 43 y.o. male without significant known PMH who presented on 8/25 with AMS as well as L hemiparesis and aphasia.  UDS + for THC.  CT with large R hemispheric infarct with confluent cytotoxic edema in R ACA and MCA territories and mild mass effect.  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  He was admitted to [facility] with neurology consultation and started on 3% saline.  Neurosurgery was consulted and Dr. Kathyrn Sheriff recommended early decompressive hemicraniectomy, which was performed that night.   He subsequently developed hemorrhagic conversion with hematoma on 9/5; helmet was removed to further release intracranial pressure and heparin was discontinued. LE doppler also indicated DVT in R peroneal, L popliteal, L posterior tibial, and L peroneal veins; IVC filter was placed.  Patient had seizure-like activity, for which he is on Keppra.  He was subsequently found to have possible RLL infiltrate and with concern for aspiration he was made NPO and started on Unasyn.  CTA was performed and it showed a non-occlusive LLL thrombus on 9/7.  Subsequent repeat DVT US was performed 9/8 and showed acute R DVT of the peroneal veins as well as acute L DVT involving common femoral vein, L femoral vein, L proximal profunda vein, L popliteal vein, L posterior tibial veins, and L peroneal veins - and it extends up into the left iliac vein and IVC".  Heparin resumed and no adverse effect so far.  Hematology consulted as well. Patient spiked fever to 102.1 on 9/9 while on Unasyn.  Had some leukocytosis which seems to have improved.  Also mild tachycardia.  Recent blood culture 9/7- so far.  No clear source of infection.  CT abdomen and pelvis, CXR, UA, urine and blood cultures ordered.  UA and CXR not impressive.  CT  abdomen with large fluid collection in lower right anterior abdominal which could be hematoma, seroma or an abscess.  Neurosurgery consulted.   Subjective: Continues to spike fever.  T-max 103.9 overnight.  Tachycardic to 110s and 120s.  Respiratory rate in 20s.  Leukocytosis improved.  Hemoglobin dropped 1 g.  Was given IV normal saline bolus overnight.   Objective: Vitals:   08/05/19 0700 08/05/19 0800 08/05/19 0900 08/05/19 1000  BP: 130/80 (!) 137/118 (!) 145/96 (!) 132/96  Pulse: (!) 102 (!) 116 (!) 122 (!) 117  Resp: (!) 22 20 (!) 23 17  Temp:  (!) 101.3 F (38.5 C)    TempSrc:  Rectal    SpO2: 100% 100% 94% 100%  Weight:      Height:        Intake/Output Summary (Last 24 hours) at 08/05/2019 1044 Last data filed at 08/05/2019 1000 Gross per 24 hour  Intake 5483.74 ml  Output 1595 ml  Net 3888.74 ml   Filed Weights   08/02/19 0500 08/04/19 0500 08/05/19 0455  Weight: 101.3 kg 100.7 kg 105.3 kg    Examination:  GENERAL: No acute distress.  Do not respond to voice or follow commands. HEENT: MMM.  CorTrack in place.  Staples over his right head. NECK: Supple.  No apparent JVD.  RESP:  No IWOB.  Fair air movement bilaterally. CVS: Tachycardic.  Regular.Marland Kitchen Heart sounds normal.  ABD/GI/GU: Bowel sounds present. Soft. Non tender.  Honeycomb dressing over right abdomen surgical wound  DCI.  Some swelling/fluid loculation under. MSK/EXT: Spontaneously moves right extremities.  Does not move left extremities. SKIN: Surgical wound over his head and abdomen as above. NEURO: Awake, alert but does not respond to voice and follows command.  PERRLA.  Patellar reflex symmetric. PSYCH: Calm.  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  Heparin held  briefly. -Heparin resumed without bolus on 9/8 after risk-benefit discussion with family. -H&H slowly downtrending.  Noted to have abdominal wall fluid collection concerning for hematoma on CT abdomen. -Neurology and neurosurgery managing.  Bilateral lower extremity DVT Nonocclusive LLL PE -IVC filter in place. -Heparin resumed on 9/8 after risk-benefit discussion -H&H gradually dropping, 2 g in the last 48 hours.   -CT A&P revealed abdominal wall fluid collection concerning for hematoma -NS consulted by Dr. Erlinda Hong 9/10 about the abdominal wall fluid collection.  I have also left a message on 9/11. -Follow hypercoagulable labs.  Seizure-like activity: -Continue Keppra per neurology  Fever and leukocytosis with bandemia: no clear source of infection but CT revealed abdominal wall fluid collection which could potentially be an abscess. With all negative work up, I also worry if his fever is related to his stroke which could be an indicator for poor outcome.  -Blood culture 9/7- so far.  Urinalysis negative. -Repeat blood and urine culture 9/10 pending. -Empiric Unasyn 9/6> 9/10 -Vancomycin, cefepime and Flagyl 9/10>>> -ID, Dr Prince Rome consulted -PRN tylenol and cooling blanket as needed  Elevated liver enzymes: Improving.  HIV negative. -Check acute hepatitis panel  Normocytic anemia: Further drop in Hgb, 2 g over the last 48 hours. Potential bleed to abdominal wall as above. -Neurosurgery consult as above -Continue monitoring H&H  Dysphagia/metabolic encephalopathy likely due to massive CVA. -Tube feed via cortrack  DVT prophylaxis: On heparin drip Code Status: Full code Family Communication:  Available if any question. Disposition Plan: Per primary Consultants: We are one of them.  Procedures:  Hemicraniectomy  Microbiology summarized: 8/25-SARS-CoV-2 screen negative 8/25-MRSA PCR negative 8/25-blood culture negative 8/25-urine culture negative 9/5-blood cultures  negative 9/7-blood cultures negative 9/10-blood cultures pending 9/10-urine cultures pending  Antimicrobials: Anti-infectives (From admission, onward)   Start     Dose/Rate Route Frequency Ordered Stop   08/05/19 1000  vancomycin (VANCOCIN) 1,250 mg in sodium chloride 0.9 % 250 mL IVPB     1,250 mg 166.7 mL/hr over 90 Minutes Intravenous Every 12 hours 08/04/19 1856     08/05/19 0600  ceFEPIme (MAXIPIME) 2 g in sodium chloride 0.9 % 100 mL IVPB     2 g 200 mL/hr over 30 Minutes Intravenous Every 8 hours 08/04/19 1856     08/04/19 2200  metroNIDAZOLE (FLAGYL) IVPB 500 mg     500 mg 100 mL/hr over 60 Minutes Intravenous Every 8 hours 08/04/19 1743     08/04/19 1830  vancomycin (VANCOCIN) 2,000 mg in sodium chloride 0.9 % 500 mL IVPB     2,000 mg 250 mL/hr over 120 Minutes Intravenous  Once 08/04/19 1805 08/04/19 2053   08/04/19 1815  ceFEPIme (MAXIPIME) 2 g in sodium chloride 0.9 % 100 mL IVPB     2 g 200 mL/hr over 30 Minutes Intravenous  Once 08/04/19 1805 08/04/19 1842   07/31/19 2200  Ampicillin-Sulbactam (UNASYN) 3 g in sodium chloride 0.9 % 100 mL IVPB  Status:  Discontinued     3 g 200 mL/hr over 30 Minutes Intravenous Every 6 hours 07/31/19 1401 08/04/19 1743   07/31/19 1415  Ampicillin-Sulbactam (UNASYN) 3 g in sodium chloride 0.9 % 100 mL IVPB     3 g 200 mL/hr over 30 Minutes Intravenous STAT 07/31/19 1401 07/31/19 1551   07/19/19 2315  bacitracin 50,000 Units in sodium chloride 0.9 % 500 mL irrigation  Status:  Discontinued       As needed 07/19/19 2315 07/19/19 2339      Sch Meds:  Scheduled Meds:  chlorhexidine  15 mL Mouth Rinse BID   Chlorhexidine Gluconate Cloth  6 each Topical Daily   famotidine  20 mg Per Tube BID   feeding supplement (PRO-STAT SUGAR FREE 64)  30 mL Per Tube TID   free water  150 mL Per Tube Q8H   levETIRAcetam  500 mg Per Tube BID   mouth rinse  15 mL Mouth Rinse q12n4p   metoprolol tartrate  50 mg Per Tube Q8H   sodium chloride  flush  10-40 mL Intracatheter Q12H   Continuous Infusions:  sodium chloride 10 mL/hr at 08/01/19 1715   sodium chloride Stopped (08/05/19 0930)   ceFEPime (MAXIPIME) IV Stopped (08/05/19 0604)   feeding supplement (OSMOLITE 1.5 CAL) 1,000 mL (08/05/19 0516)   heparin 1,350 Units/hr (08/05/19 1000)   metronidazole Stopped (08/05/19 0708)   vancomycin 166.7 mL/hr at 08/05/19 1000   PRN Meds:.sodium chloride, acetaminophen **OR** acetaminophen (TYLENOL) oral liquid 160 mg/5 mL **OR** acetaminophen, labetalol, senna-docusate, sodium chloride flush   I have personally reviewed the following labs and images: CBC: Recent Labs  Lab 07/30/19 0434  08/01/19 0450 08/02/19 0222 08/03/19 0512 08/04/19 0440 08/05/19 0457  WBC 16.1*   < > 13.5* 17.5* 16.4* 13.6* 12.7*  NEUTROABS 11.5*  --   --   --   --   --   --   HGB 12.0*   < > 12.2* 11.9* 11.2* 10.3* 9.4*  HCT 36.5*   < > 37.5* 35.6* 34.3* 32.0* 29.9*  MCV 86.1   < > 87.4 85.8 87.3 88.6 87.4  PLT 236   < > 214 198 185 204 222   < > = values in this interval not displayed.   BMP &GFR Recent Labs  Lab 07/31/19 0715 08/01/19 0450 08/02/19 0222 08/03/19 0512 08/04/19 0440 08/05/19 0457  NA 139 140 137 136 135  --   K 4.4 4.3 4.5 4.2 3.9  --   CL 103 104 105 104 103  --   CO2 25 25 24 22 22   --   GLUCOSE 126* 136* 142* 138* 130*  --   BUN 22* 23* 24* 24* 23*  --   CREATININE 0.95 1.14 1.05 0.95 0.89  --   CALCIUM 9.3 8.8* 8.6* 8.7* 8.4*  --   MG  --   --   --   --  2.1 2.1  PHOS  --   --   --   --  3.5 3.7   Estimated Creatinine Clearance: 134.9 mL/min (by C-G formula based on SCr of 0.89 mg/dL). Liver & Pancreas: Recent Labs  Lab 07/31/19 0715 08/02/19 0222 08/04/19 0440  AST 97* 63* 67*  ALT 174* 116* 80*  ALKPHOS 95 94 94  BILITOT 0.7 0.8 0.7  PROT 8.0 7.2 7.2  ALBUMIN 3.4* 3.0* 2.8*   No results for input(s): LIPASE, AMYLASE in the last 168 hours. No results for input(s): AMMONIA in  the last 168  hours. Diabetic: No results for input(s): HGBA1C in the last 72 hours. Recent Labs  Lab 08/04/19 1503 08/04/19 1913 08/04/19 2339 08/05/19 0313 08/05/19 0716  GLUCAP 113* 120* 108* 118* 108*   Cardiac Enzymes: No results for input(s): CKTOTAL, CKMB, CKMBINDEX, TROPONINI in the last 168 hours. No results for input(s): PROBNP in the last 8760 hours. Coagulation Profile: No results for input(s): INR, PROTIME in the last 168 hours. Thyroid Function Tests: No results for input(s): TSH, T4TOTAL, FREET4, T3FREE, THYROIDAB in the last 72 hours. Lipid Profile: No results for input(s): CHOL, HDL, LDLCALC, TRIG, CHOLHDL, LDLDIRECT in the last 72 hours. Anemia Panel: No results for input(s): VITAMINB12, FOLATE, FERRITIN, TIBC, IRON, RETICCTPCT in the last 72 hours. Urine analysis:    Component Value Date/Time   COLORURINE AMBER (A) 08/04/2019 0759   APPEARANCEUR CLEAR 08/04/2019 0759   LABSPEC 1.027 08/04/2019 0759   PHURINE 5.0 08/04/2019 0759   GLUCOSEU NEGATIVE 08/04/2019 0759   HGBUR NEGATIVE 08/04/2019 0759   BILIRUBINUR NEGATIVE 08/04/2019 0759   KETONESUR NEGATIVE 08/04/2019 0759   PROTEINUR 30 (A) 08/04/2019 0759   NITRITE NEGATIVE 08/04/2019 0759   LEUKOCYTESUR NEGATIVE 08/04/2019 0759   Sepsis Labs: Invalid input(s): PROCALCITONIN, [place]  Microbiology: Recent Results (from the past 240 hour(s))  Culture, blood (routine x 2)     Status: None (Preliminary result)   Collection Time: 07/30/19  1:09 PM   Specimen: BLOOD LEFT ARM  Result Value Ref Range Status   Specimen Description BLOOD LEFT ARM  Final   Special Requests   Final    BOTTLES DRAWN AEROBIC AND ANAEROBIC Blood Culture adequate volume   Culture   Final    NO GROWTH 4 DAYS Performed at [facility], [address], [place], [place] [address]    Report Status PENDING  Incomplete  Culture, blood (routine x 2)     Status: None (Preliminary result)   Collection Time: 07/30/19  1:09 PM   Specimen:  BLOOD RIGHT HAND  Result Value Ref Range Status   Specimen Description BLOOD RIGHT HAND  Final   Special Requests   Final    BOTTLES DRAWN AEROBIC ONLY Blood Culture results may not be optimal due to an inadequate volume of blood received in culture bottles   Culture   Final    NO GROWTH 4 DAYS Performed at [facility], [place] [address] [place], [place] [address]    Report Status PENDING  Incomplete  Culture, blood (Routine X 2) w Reflex to ID Panel     Status: None (Preliminary result)   Collection Time: 08/01/19  4:50 AM   Specimen: BLOOD  Result Value Ref Range Status   Specimen Description BLOOD PICC LINE  Final   Special Requests   Final    BOTTLES DRAWN AEROBIC ONLY Blood Culture adequate volume   Culture   Final    NO GROWTH 2 DAYS Performed at [facility], [place] [address] [place], [place] [address]    Report Status PENDING  Incomplete  Culture, blood (Routine X 2) w Reflex to ID Panel     Status: None (Preliminary result)   Collection Time: 08/01/19  4:50 AM   Specimen: BLOOD RIGHT HAND  Result Value Ref Range Status   Specimen Description BLOOD RIGHT HAND  Final   Special Requests   Final    BOTTLES DRAWN AEROBIC ONLY Blood Culture adequate volume   Culture   Final  NO GROWTH 2 DAYS Performed at [facility], [place] [address], [place], [place] [address]    Report Status PENDING  Incomplete  Culture, blood (routine x 2)     Status: None (Preliminary result)   Collection Time: 08/04/19  6:59 PM   Specimen: BLOOD LEFT HAND  Result Value Ref Range Status   Specimen Description BLOOD LEFT HAND  Final   Special Requests   Final    BOTTLES DRAWN AEROBIC ONLY Blood Culture results may not be optimal due to an inadequate volume of blood received in culture bottles Performed at [facility], [place] [address], [place], [place] [address]    Culture PENDING  Incomplete   Report Status PENDING  Incomplete    Radiology Studies: Ct Abdomen Pelvis W  Contrast  Result Date: 08/04/2019 CLINICAL DATA:  43 year old male with history of acute onset of generalized abdominal pain with fever. EXAM: CT ABDOMEN AND PELVIS WITH CONTRAST TECHNIQUE: Multidetector CT imaging of the abdomen and pelvis was performed using the standard protocol following bolus administration of intravenous contrast. CONTRAST:  132mL OMNIPAQUE IOHEXOL 350 MG/ML SOLN COMPARISON:  No priors. FINDINGS: Lower chest: Central venous catheter terminating in the right atrium. Feeding tube extending into the distal stomach. Trace right pleural effusion lying dependently. Hepatobiliary: No suspicious cystic or solid hepatic lesions. No intra or extrahepatic biliary ductal dilatation. Gallbladder is nearly completely decompressed, but otherwise unremarkable in appearance. Pancreas: No pancreatic mass. No pancreatic ductal dilatation. No pancreatic or peripancreatic fluid collections or inflammatory changes. Spleen: Unremarkable. Adrenals/Urinary Tract: Subcentimeter low-attenuation lesion in the upper pole of the left kidney, too small to characterize, but statistically likely to represent a cyst. Right kidney and bilateral adrenal glands are normal in appearance. No hydroureteronephrosis. Urinary bladder is normal in appearance. Stomach/Bowel: Feeding tube terminating in the antral pre-pyloric region of the stomach. Stomach is otherwise normal in appearance. No pathologic dilatation of small bowel or colon. Normal appendix. Vascular/Lymphatic: No significant atherosclerotic disease, aneurysm or dissection noted in the abdominal or pelvic vasculature. IVC filter in position with tip terminating below the level of the renal veins. No lymphadenopathy noted in the abdomen or pelvis. Reproductive: Prostate gland and seminal vesicles are unremarkable in appearance. Other: Along the left pelvic sidewall there is a 2.9 x 4.5 cm high attenuation collection (axial image 91 of series 3), favored to represent a  hematoma. Trace volume of ascites. No pneumoperitoneum. Musculoskeletal: In the right anterior abdominal wall superficial to the abdominal wall musculature within the subcutaneous fat there is a 14.2 x 4.7 x 11.0 cm intermediate attenuation (41 HU) collection which has some internal gas and small amount of peripheral enhancement. There is also what appears to be a surgical drain in place, although it is unclear whether this surgical drain communicates with the skin surface. Overlying skin staples are noted. There are no aggressive appearing lytic or blastic lesions noted in the visualized portions of the skeleton. IMPRESSION: 1. Large fluid collection in the lower right anterior abdominal wall subcutaneous fat with what appears to be an internal surgical drain, favored to represent a postoperative hematoma or proteinaceous seroma, however, the possibility of an abscess is not excluded in light of the patient's fever. 2. Small high attenuation fluid collection along the left pelvic sidewall which likely represents a hematoma, as above. 3. Additional incidental findings, as above. Electronically Signed   By: Vinnie Langton M.D.   On: 08/04/2019 18:30    Amaryah Mallen T. [place]  If 7PM-7AM, please contact night-coverage [email or web address] Password [facility] 08/05/2019, 10:44 AM

## 2019-08-05 NOTE — Consult Note (Signed)
[facility]       Reason for Consult: possible abdominal abscess    Referring Physician: Wendee Beavers, MD  Active Problems:   Stroke (cerebrum) ([place])   Pressure injury of skin   Acute respiratory failure ([place])   Endotracheal tube present   Deep vein thrombosis (DVT) of non-extremity vein   Hypokalemia   FUO (fever of unknown origin)   Acute blood loss anemia   SIRS (systemic inflammatory response syndrome) (H[facility])   Leukocytosis   Primary hypercoagulable state ([place])   Acute pulmonary embolism without acute cor pulmonale ([place])   . chlorhexidine  15 mL Mouth Rinse BID  . Chlorhexidine Gluconate Cloth  6 each Topical Daily  . famotidine  20 mg Per Tube BID  . feeding supplement (PRO-STAT SUGAR FREE 64)  30 mL Per Tube TID  . free water  150 mL Per Tube Q8H  . levETIRAcetam  500 mg Per Tube BID  . mouth rinse  15 mL Mouth Rinse q12n4p  . metoprolol tartrate  50 mg Per Tube Q8H  . sodium chloride flush  10-40 mL Intracatheter Q12H    Recommendations: 1. Fever -multiple causes of fever both infectious and noninfectious are possibilities at present.  At the present time, central fever from his primary CNS process with severe thrombotic disease to his right hemisphere and subsequent hemorrhagic conversion would be the leading probability.  Other considerations would involve his thrombotic pulmonary embolus, bilateral lower extremity VT's and now confirmed right lower quadrant hematoma.  Check blood cultures x2 to exclude sepsis although the patient does not show any hypotension to suggest this.  If the patient were to become hypotensive, then it would be reasonable to check a lactic acid as well and consider removing his invasive lines once peripheral IVs have been established.  Follow-up the patient's culture from his percutaneous drainage attempted by neurosurgery today and continue broad-spectrum antibiotics as noted below.  2. Abdominal fluid collection -I  appreciate the neurosurgical service performing a percutaneous drainage of the patient's right lower quadrant bone flap fluid collection.  Given clinical report of significant resistance and viscous fluid more consistent with a hematoma, this is my suspicion is well.  Obviously he will remain at risk for secondary infection, so we will continue broad-spectrum antibiotics with vancomycin cefepime and Flagyl while culture results are pending.  If the patient has negative blood cultures and wound cultures over the next several days will likely stop all antibiotics.  I had a prolonged discussion with the patient's wife who initially was quite confrontational, claiming she poorly understood that the heparin given to her husband would cause increased likelihood of hemorrhage at surgical sites and other locations.  While I think she has a better understanding of these risks involved with her husband's care, she is asking to speak with both the neurologist and neurosurgeon to discuss these issues further.  3. Leukocytosis -mild elevation noted on today's CBC with white blood cell count of 12,700.  His white blood cell count has continued to wax and wane throughout his hospital course thus far.  Unsure if this is related to infection or more significant reactive processes as outlined above.  Would check CBC with differential daily until the patient consistently has a normal white blood cell count.  Assessment: Patient is a 43 year old African-American male with recent devastating stroke requiring craniotomy who then developed hemorrhagic conversion and has subsequently developed fever and leukocytosis.  Antibiotics: Vancomycin, day 2 Cefepime, day 2  Flagyl, day 2  HPI: Matthew Black is a 43 y.o. 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  Once he  was able to be aroused, clinically he was found to have dense left hemiplegia and substantially aphasia.  The evening of admission he underwent a decompressive right hemicranial craniectomy and implantation of his skull flap to his right abdominal wall.  In work-up of his stroke he was found to have bilateral DVTs in his legs for which she had an IVC filter placed on July 21, 2019, and more recently, a CT angiogram of the chest showed a nonocclusive left posterior lower lobe pulmonary embolus.  His hospital course thus far has been complicated by hemorrhagic conversion of his stroke within the thalamus and RT frontal lobe and persistent dense neurologic deficits.  Postoperatively, patient has maintained a fever for his entire admission sparing 2 days thus far.  More recently, he developed worsening leukocytosis and bulge in his lower abdomen near his surgical site.  CT of the abdomen pelvis performed yesterday showed a large fluid collection to his right anterior abdominal wall measuring 14.2 x 4.7 x 11 cm most concerning for a postoperative hematoma or seroma.  The neurosurgical service aspirated a portion of this fluid collection percutaneously this morning and sent fluid for culture.  The neurosurgical note indicates that the majority of the fluid could not be aspirated as the fluid was viscous most consistent with a hematoma.  He was empirically started on vancomycin cefepime and Flagyl prior to my assessment. Fever curve, WBC & Cr trends, imaging, cx results, and ABX usage all independently reviewed  Review of Systems:  Review of Systems  Unable to perform ROS: Medical condition     All other systems reviewed and are negative    Past Medical History:  Diagnosis Date  . 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 Smoking status: Former Smoker    Packs/day: 0.00    Quit date: 11/24/2015    Years since quitting: 3.6  . Smokeless tobacco: Never Used  . Tobacco  comment:    Substance Use Topics  . Alcohol use: Yes    Comment: occasionally  . Drug use: Never    Family History  Problem Relation Age of Onset  . Diabetes Other        GF  . Healthy Mother   . Healthy Father      Current Facility-Administered Medications:  .  0.9 %  sodium chloride infusion, , Intravenous, PRN, Burnetta Sabin L, NP, Last Rate: 10 mL/hr at 08/01/19 1715 .  0.9 %  sodium chloride infusion, , Intravenous, Continuous, Rosalin Hawking, MD, Last Rate: 75 mL/hr at 08/05/19 1300 .  acetaminophen (TYLENOL) tablet 650 mg, 650 mg, Oral, Q4H PRN **OR** acetaminophen (TYLENOL) solution 650 mg, 650 mg, Per Tube, Q4H PRN, 650 mg at 08/05/19 1341 **OR** acetaminophen (TYLENOL) suppository 650 mg, 650 mg, Rectal, Q4H PRN, Biby, Sharon L, NP, 650 mg at 07/28/19 1710 .  ceFEPIme (MAXIPIME) 2 g in sodium chloride 0.9 % 100 mL IVPB, 2 g, Intravenous, Q8H, Rolla Flatten, RPH, Last Rate: 200 mL/hr at 08/05/19 1350, 2 g at 08/05/19 1350 .  chlorhexidine (PERIDEX) 0.12 % solution 15 mL, 15 mL, Mouth Rinse, BID, Garvin Fila, MD, 15 mL at 08/05/19 0927 .  Chlorhexidine  Gluconate Cloth 2 % PADS 6 each, 6 each, Topical, Daily, Rosalin Hawking, MD, 6 each at 08/04/19 2200 .  famotidine (PEPCID) 40 MG/5ML suspension 20 mg, 20 mg, Per Tube, BID, Biby, Sharon L, NP, 20 mg at 08/05/19 0927 .  feeding supplement (OSMOLITE 1.5 CAL) liquid 1,000 mL, 1,000 mL, Per Tube, Continuous, Rosalin Hawking, MD, Last Rate: 60 mL/hr at 08/05/19 0516, 1,000 mL at 08/05/19 0516 .  feeding supplement (PRO-STAT SUGAR FREE 64) liquid 30 mL, 30 mL, Per Tube, TID, Rosalin Hawking, MD, 30 mL at 08/05/19 0928 .  free water 150 mL, 150 mL, Per Tube, Q8H, Gonfa, Taye T, MD, 150 mL at 08/05/19 1447 .  heparin ADULT infusion 100 units/mL (25000 units/248mL sodium chloride 0.45%), 1,350 Units/hr, Intravenous, Continuous, Erenest Blank, RPH, Last Rate: 13.5 mL/hr at 08/05/19 1300, 1,350 Units/hr at 08/05/19 1300 .  labetalol (NORMODYNE)  injection 5-20 mg, 5-20 mg, Intravenous, Q2H PRN, Biby, Sharon L, NP .  levETIRAcetam (KEPPRA) 100 MG/ML solution 500 mg, 500 mg, Per Tube, BID, Biby, Sharon L, NP, 500 mg at 08/05/19 1031 .  MEDLINE mouth rinse, 15 mL, Mouth Rinse, q12n4p, Garvin Fila, MD, 15 mL at 08/05/19 1200 .  metoprolol tartrate (LOPRESSOR) tablet 50 mg, 50 mg, Per Tube, Q8H, Rosalin Hawking, MD, 50 mg at 08/05/19 1342 .  metroNIDAZOLE (FLAGYL) IVPB 500 mg, 500 mg, Intravenous, Q8H, Gonfa, Taye T, MD, Last Rate: 100 mL/hr at 08/05/19 1447, 500 mg at 08/05/19 1447 .  senna-docusate (Senokot-S) tablet 1 tablet, 1 tablet, Oral, QHS PRN, Biby, Sharon L, NP .  sodium chloride flush (NS) 0.9 % injection 10-40 mL, 10-40 mL, Intracatheter, Q12H, Biby, Sharon L, NP, 10 mL at 08/05/19 0934 .  sodium chloride flush (NS) 0.9 % injection 10-40 mL, 10-40 mL, Intracatheter, PRN, Burnetta Sabin L, NP, 10 mL at 08/02/19 2213 .  vancomycin (VANCOCIN) 1,250 mg in sodium chloride 0.9 % 250 mL IVPB, 1,250 mg, Intravenous, Q12H, Rolla Flatten, [facility], Stopped at 08/05/19 1101  Allergies  Allergen Reactions  . Hydrocodone Nausea Only    Vitals:   08/05/19 1200 08/05/19 1300  BP: (!) 150/80 (!) 167/111  Pulse: (!) 124 (!) 127  Resp: 17 (!) 23  Temp: (!) 102.7 F (39.3 C) (!) 103.6 F (39.8 C)  SpO2: 100% 100%     Physical Exam Lines: RT arm PICC, DHT, condom urinary catheter, PIV Gen: Minimal verbal responses but does able to follow some simple commands only on his right side as he has persistent dense left-sided hemiplegia, unable to maintain gaze Head: +craniotomy incision c/d/i with staples w/o surrounding erythema or drainage, no temporal wasting evident EENT: Significant anisocoria, right pupil is more reactive, unable to maintain gaze making exam difficult, MMM, adequate dentition, +DHT Neck: supple, no JVD CV: tachycardic rate, RR, no murmurs evident Pulm: CTA bilaterally, mild experiratory wheeze, no retractions Abd: soft  except to right lower quadrant where there is a palpable mass adjacent to his surgical incision, no expressible drainage from his wound nor overlying erythema is evident at present, +BS Extrems:  trace LE edema, 2+ pulses Skin: no rashes, adequate skin turgor Neuro: Dense left-sided hemiplegia with an some lesser deficits to his right arm and leg, alertness is more making for difficult exam, the patient is unable to maintain gaze, + early contractures to his left arm and less so to his left leg positive Babinski to his left lower extremity and mute on his right lower extremity  Lab Results  Component Value Date   WBC 12.7 (H) 08/05/2019   HGB 9.4 (L) 08/05/2019   HCT 29.9 (L) 08/05/2019   MCV 87.4 08/05/2019   PLT 222 08/05/2019    Lab Results  Component Value Date   CREATININE 0.89 08/04/2019   BUN 23 (H) 08/04/2019   NA 135 08/04/2019   K 3.9 08/04/2019   CL 103 08/04/2019   CO2 22 08/04/2019    Lab Results  Component Value Date   ALT 80 (H) 08/04/2019   AST 67 (H) 08/04/2019   ALKPHOS 94 08/04/2019     Microbiology: Recent Results (from the past 240 hour(s))  Culture, blood (routine x 2)     Status: None (Preliminary result)   Collection Time: 07/30/19  1:09 PM   Specimen: BLOOD LEFT ARM  Result Value Ref Range Status   Specimen Description BLOOD LEFT ARM  Final   Special Requests   Final    BOTTLES DRAWN AEROBIC AND ANAEROBIC Blood Culture adequate volume   Culture   Final    NO GROWTH 4 DAYS Performed at [facility], [place] [address] [place], [place] [address]    Report Status PENDING  Incomplete  Culture, blood (routine x 2)     Status: None (Preliminary result)   Collection Time: 07/30/19  1:09 PM   Specimen: BLOOD RIGHT HAND  Result Value Ref Range Status   Specimen Description BLOOD RIGHT HAND  Final   Special Requests   Final    BOTTLES DRAWN AEROBIC ONLY Blood Culture results may not be optimal due to an inadequate volume of blood received in culture  bottles   Culture   Final    NO GROWTH 4 DAYS Performed at [facility], [place] [address], [place], [place] [address]    Report Status PENDING  Incomplete  Culture, blood (Routine X 2) w Reflex to ID Panel     Status: None (Preliminary result)   Collection Time: 08/01/19  4:50 AM   Specimen: BLOOD  Result Value Ref Range Status   Specimen Description BLOOD PICC LINE  Final   Special Requests   Final    BOTTLES DRAWN AEROBIC ONLY Blood Culture adequate volume   Culture   Final    NO GROWTH 4 DAYS Performed at [facility], [place] [address] [place], [place] [address]    Report Status PENDING  Incomplete  Culture, blood (Routine X 2) w Reflex to ID Panel     Status: None (Preliminary result)   Collection Time: 08/01/19  4:50 AM   Specimen: BLOOD RIGHT HAND  Result Value Ref Range Status   Specimen Description BLOOD RIGHT HAND  Final   Special Requests   Final    BOTTLES DRAWN AEROBIC ONLY Blood Culture adequate volume   Culture   Final    NO GROWTH 4 DAYS Performed at [facility], [place] [address] [place], [place] [address]    Report Status PENDING  Incomplete  Culture, Urine     Status: None   Collection Time: 08/04/19 12:48 PM   Specimen: Urine, Random  Result Value Ref Range Status   Specimen Description URINE, RANDOM  Final   Special Requests NONE  Final   Culture   Final    NO GROWTH Performed at [facility], [place] [address], [place],  [address]    Report Status 08/05/2019 FINAL  Final  Culture, blood (routine x 2)     Status: None (  Preliminary result)   Collection Time: 08/04/19  6:59 PM   Specimen: BLOOD LEFT ARM  Result Value Ref Range Status   Specimen Description BLOOD LEFT ARM  Final   Special Requests   Final    BOTTLES DRAWN AEROBIC ONLY Blood Culture results may not be optimal due to an inadequate volume of blood received in culture bottles   Culture   Final    NO GROWTH < 24 HOURS Performed at [facility], [address], [place], [place] [address]    Report Status PENDING  Incomplete  Culture, blood (routine x 2)     Status: None (Preliminary result)   Collection Time: 08/04/19  6:59 PM   Specimen: BLOOD LEFT HAND  Result Value Ref Range Status   Specimen Description BLOOD LEFT HAND  Final   Special Requests   Final    BOTTLES DRAWN AEROBIC ONLY Blood Culture results may not be optimal due to an inadequate volume of blood received in culture bottles   Culture   Final    NO GROWTH < 24 HOURS Performed at [facility], [place] [address], [place], [place] [address]    Report Status PENDING  Incomplete  Body fluid culture     Status: None (Preliminary result)   Collection Time: 08/05/19 11:33 AM   Specimen: Body Fluid  Result Value Ref Range Status   Specimen Description FLUID ABDOMEN  Final   Special Requests NONE  Final   Gram Stain   Final    RARE WBC PRESENT,BOTH PMN AND MONONUCLEAR NO ORGANISMS SEEN Performed at [facility], [address] [place], [place] [address]    Culture PENDING  Incomplete   Report Status PENDING  Incomplete   66 minutes of critical care time spent  Janine Ores, [facility] [place] [email or web address] 08/05/2019, 4:15 PM

## 2019-08-05 NOTE — Progress Notes (Signed)
PT Cancellation Note  Patient Details Name: Matthew Black MRN: [id number] DOB: 08/12/1976   Cancelled Treatment:    Reason Eval/Treat Not Completed: Medical issues which prohibited therapy.  Pt remains febrile (103) and RN recommending hold as they have not been able to get his fevers down and mobilizing may increase them.  PT will check back Monday 08/08/19.  Thanks,  Barbarann Ehlers. Kessler Kopinski, PT, DPT  Acute Rehabilitation [phone] pager [phone] office  @ [facility]: [phone]     [name] 08/05/2019, 2:31 PM

## 2019-08-05 NOTE — Progress Notes (Signed)
Patient with continued fevers. 1900 fever of 102 treated with tylenol. 0100 Fever of 102.5 treated with tylenol without improvement. Recheck of temperature 103 axillary. Patient with increased HR up to 120 and BP increased to 138/98. Notified Jeannette Corpus, NP- orders received for 1,000 ml LR bolus, and Ketorolac 30 mg IV once. Prior to administration, Rectal Temp 103.9. Will monitor patient closely.   Milford Cage, RN

## 2019-08-06 LAB — COMPREHENSIVE METABOLIC PANEL
ALT: 68 U/L — ABNORMAL HIGH (ref 0–44)
AST: 105 U/L — ABNORMAL HIGH (ref 15–41)
Albumin: 2.7 g/dL — ABNORMAL LOW (ref 3.5–5.0)
Alkaline Phosphatase: 85 U/L (ref 38–126)
Anion gap: 10 (ref 5–15)
BUN: 19 mg/dL (ref 6–20)
CO2: 23 mmol/L (ref 22–32)
Calcium: 8.4 mg/dL — ABNORMAL LOW (ref 8.9–10.3)
Chloride: 103 mmol/L (ref 98–111)
Creatinine, Ser: 0.86 mg/dL (ref 0.61–1.24)
GFR calc Af Amer: >60 mL/min (ref >60)
GFR calc non Af Amer: >60 mL/min (ref >60)
Glucose, Bld: 134 mg/dL — ABNORMAL HIGH (ref 70–99)
Potassium: 3.6 mmol/L (ref 3.5–5.1)
Sodium: 136 mmol/L (ref 135–145)
Total Bilirubin: 0.6 mg/dL (ref 0.3–1.2)
Total Protein: 7.3 g/dL (ref 6.5–8.1)

## 2019-08-06 LAB — GLUCOSE, CAPILLARY
Glucose-Capillary: 105 mg/dL — ABNORMAL HIGH (ref 70–99)
Glucose-Capillary: 114 mg/dL — ABNORMAL HIGH (ref 70–99)
Glucose-Capillary: 116 mg/dL — ABNORMAL HIGH (ref 70–99)
Glucose-Capillary: 120 mg/dL — ABNORMAL HIGH (ref 70–99)
Glucose-Capillary: 126 mg/dL — ABNORMAL HIGH (ref 70–99)
Glucose-Capillary: 134 mg/dL — ABNORMAL HIGH (ref 70–99)

## 2019-08-06 LAB — CBC
HCT: 29.4 % — ABNORMAL LOW (ref 39.0–52.0)
Hemoglobin: 9.4 g/dL — ABNORMAL LOW (ref 13.0–17.0)
MCH: 28.1 pg (ref 26.0–34.0)
MCHC: 32 g/dL (ref 30.0–36.0)
MCV: 88 fL (ref 80.0–100.0)
Platelets: 221 10*3/uL (ref 150–400)
RBC: 3.34 MIL/uL — ABNORMAL LOW (ref 4.22–5.81)
RDW: 13.5 % (ref 11.5–15.5)
WBC: 13.1 10*3/uL — ABNORMAL HIGH (ref 4.0–10.5)
nRBC: 0 % (ref 0.0–0.2)

## 2019-08-06 LAB — CK: Total CK: 2594 U/L — ABNORMAL HIGH (ref 49–397)

## 2019-08-06 LAB — PHOSPHORUS: Phosphorus: 3.2 mg/dL (ref 2.5–4.6)

## 2019-08-06 LAB — CULTURE, BLOOD (ROUTINE X 2)
Culture: NO GROWTH
Culture: NO GROWTH
Special Requests: ADEQUATE
Special Requests: ADEQUATE

## 2019-08-06 LAB — PROTEIN C, TOTAL: Protein C, Total: 111 % (ref 60–150)

## 2019-08-06 LAB — MAGNESIUM: Magnesium: 2 mg/dL (ref 1.7–2.4)

## 2019-08-06 LAB — HEPARIN LEVEL (UNFRACTIONATED): Heparin Unfractionated: 0.42 IU/mL (ref 0.30–0.70)

## 2019-08-06 MED ORDER — BUSPIRONE HCL 15 MG PO TABS
30.0000 mg | ORAL_TABLET | Freq: Three times a day (TID) | ORAL | Status: DC
  Administered 2019-08-06 – 2019-08-14 (×24): 30 mg via ORAL
  Filled 2019-08-06 (×2): qty 2
  Filled 2019-08-06: qty 3
  Filled 2019-08-06 (×2): qty 2
  Filled 2019-08-06: qty 3
  Filled 2019-08-06 (×4): qty 2
  Filled 2019-08-06 (×2): qty 3
  Filled 2019-08-06 (×6): qty 2
  Filled 2019-08-06: qty 3
  Filled 2019-08-06 (×6): qty 2

## 2019-08-06 MED ORDER — BACLOFEN 1 MG/ML ORAL SUSPENSION
10.0000 mg | Freq: Three times a day (TID) | ORAL | Status: DC
  Administered 2019-08-06 – 2019-08-14 (×24): 10 mg via ORAL
  Filled 2019-08-06 (×29): qty 1

## 2019-08-06 NOTE — Progress Notes (Signed)
STROKE TEAM PROGRESS NOTE   INTERVAL HISTORY Pt continued fever and chills on broad spectrum antibiotics. CT abd/pel showed fluid collection around bone flap in the belly. Dr. Kathyrn Sheriff contacted and aspiration showed old hematoma component which has sent for culture but negative for growth at 24 hrs so far and no organisms noted.. Not infectious source at this time. Will have ID on board. Dr. Cyndia Skeeters has contacted ID already. Appreciate help.    Vitals:   08/06/19 1000 08/06/19 1100 08/06/19 1139 08/06/19 1200  BP: 126/75 128/87  125/84  Pulse: 70 (!) 129  (!) 116  Resp: (!) 30 20  19   Temp:   (!) 103.3 F (39.6 C)   TempSrc:   Core   SpO2: 99% 100%  100%  Weight:      Height:        CBC:  Recent Labs  Lab 08/05/19 0457 08/06/19 0635  WBC 12.7* 13.1*  HGB 9.4* 9.4*  HCT 29.9* 29.4*  MCV 87.4 88.0  PLT 222 [id number]    Basic Metabolic Panel:  Recent Labs  Lab 08/04/19 0440 08/05/19 0457 08/06/19 0635  NA 135  --  136  K 3.9  --  3.6  CL 103  --  103  CO2 22  --  23  GLUCOSE 130*  --  134*  BUN 23*  --  19  CREATININE 0.89  --  0.86  CALCIUM 8.4*  --  8.4*  MG 2.1 2.1 2.0  PHOS 3.5 3.7 3.2    IMAGING Ct Head Wo Contrast Ct Cervical Spine Wo Contrast  07/19/2019 1132 1. 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 2. No associated hemorrhage and mild intracranial mass effect at this time, including trace leftward midline shift. 3. Evidence of large vessel occlusion: Hyperdensity of the right ICA terminus, the right A1 and MCA. 4. Unaffected brain parenchyma appears negative. 5.  No acute traumatic injury identified in the cervical spine.   Mr Brain 75 Contrast Mr Angio Head Wo Contrast Mr Angio Neck Wo Contrast 07/19/2019 1330 1. 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 The right ICA terminus, right ACA and MCA also appear occluded. 2. 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 Cytotoxic  edema but no convincing hemorrhage. Stable mild mass effect with trace leftward midline shift. 3. Intracranial MRA is degraded by motion artifact, but no other large vessel occlusion is suspected. There appears to be lobes reperfusion of the right PCA. 4. The left hemisphere and posterior fossa brain parenchyma appears normal.   Ct Head Wo Contrast 07/19/2019 1532 Similar appearance to earlier. Low-density and swelling of the right hemisphere in the ACA and MCA territories with mass effect and right-to-left shift of 5-6 mm. No hemorrhagic transformation at this time.   Ct Head Wo Contrast 07/20/2019 0444 1. Acute right ACA and MCA territory infarct with progressive swelling that has decompressed through the craniectomy defect. 2. No acute hemorrhage or new infarction.   Ct Angio Head W Or Wo Contrast Ct Angio Neck W Or Wo Contrast 07/21/2019 1. Stable from prior MRA. 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 No evidence of atherosclerosis or vasculopathy in the other vessels. 2. Cytotoxic edema causes 5 mm of midline shift and brain bulging through the craniectomy defect. Mild petechial hemorrhage is seen at the basal ganglia.   Ct Head Wo Contrast 07/23/2019 1. Unchanged  appearance of massive right MCA and ACA territory infarcts with parenchyma extending through decompressive craniectomy. 2. Small focus of suspected hemorrhage adjacent to the right caudate head.  Ct Head Wo Contrast 07/30/2019 1. Decreasing mass effect within large right anterior MCA and ACA territory infarct. 2. Midline shift is no longer present. There is decreased effacement of the right lateral ventricle. 3. Infarcted brain tissue still herniates through the craniectomy site. 4. New parenchymal hemorrhage within the infarcted tissue anteriorly measures 2.5 x 2.3 x 2.7 cm. 5. No new infarct.  Ct Head Wo Contrast 08/01/2019  1. Stable head CT since 07/30/2019. 2. Again noted  is a large infarct involving the right MCA and right ACA territories with brain tissue herniating through the right craniotomy defect. 3. Parenchymal hemorrhage in the right anterior cortex has minimally changed. Stable petechial hemorrhage in the right basal ganglia region.   Dg Chest Port 1 View 08/04/2019 Well-positioned support structures. No active cardiopulmonary disease.  08/01/2019 No acute disease   Ct Angio Chest Pe W Or Wo Contrast 08/01/2019 Nonocclusive thrombus seen within the left posterior lower lobe segmental artery.    2D Echocardiogram 07/20/2019  1. The left ventricle has normal systolic function with an ejection fraction of 60-65%. The cavity size was normal. Left ventricular diastolic parameters were normal.  2. The right ventricle has normal systolic function. The cavity was normal. There is no increase in right ventricular wall thickness.  3. The pericardial effusion is circumferential.  4. Trivial pericardial effusion is present.  5. The mitral valve is grossly normal.  6. The tricuspid valve is grossly normal.  7. The aortic valve is tricuspid. No stenosis of the aortic valve.  8. The aorta is normal unless otherwise noted.  9. The aortic root is normal in size and structure. 10. No cardiac source of embolism identified. 11. When compared to the prior study: No comparison.  LE Dopplers 08/02/2019 Right: Findings consistent with acute deep vein thrombosis involving the right peroneal veins.  Left: Findings consistent with acute deep vein thrombosis involving the left common femoral vein, left femoral vein, left proximal profunda vein, left popliteal vein, left posterior tibial veins, and left peroneal veins. Extending up into left iliac vein  and IVC.  07/21/2019 Right: Findings consistent with acute deep vein thrombosis involving the right peroneal veins. Left: Findings consistent with acute deep vein thrombosis involving the left popliteal vein, left posterior  tibial veins, and left peroneal veins.  Vas [place] Transcranial Doppler W Bubbles 07/25/2019 No HITS heard heard at rest. No HITS heard heard during valsalva.   2D echo w/bubble 08/03/2019 Limited bubble study for shunt. No evidence for atrial level right to left shunt.  Ct Abdomen Pelvis W Contrast 08/04/2019 1. Large fluid collection in the lower right anterior abdominal wall subcutaneous fat with what appears to be an internal surgical drain, favored to represent a postoperative hematoma or proteinaceous seroma, however, the possibility of an abscess is not excluded in light of the patient's fever.  2. Small high attenuation fluid collection along the left pelvic sidewall which likely represents a hematoma, as above.  3. Additional incidental findings, as above.   PHYSICAL EXAM   General - Well nourished, well developed, middle-aged African-American male.  He has right hemicraniectomy surgical incision on the scalp. Has chills during rounding  Ophthalmologic - fundi not visualized due to noncooperation.  Cardiovascular - Regular rhythm, tachycardia.  Neuro - patient is wake, still has chills, eyes open spontaneously. Nonverbal, but able to  follow all simple commands on the right hand and foot. Eyes in right gaze position, barely cross midline, not blinking to visual threat to the left, PERRL. Left facial droop.  Tongue protrusion not corporative.  Has spontaneous movement of RUE against gravity 4/5 at least and RLE in bed 2+/5 at least. On pain stimulation, mild withdraw of LLE, but no movement of LUE. DTR 1+ and no babinski. Sensation, coordination and gait not tested.   ASSESSMENT/PLAN Matthew Black is a 43 y.o. male with no significant past medical history found down x 2 days nonverbal with L hemiparesis.   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 Trace L midline shift. ELVO at R ICA, R A1, R MCA.  MRI  Large R brain infarct sparing R PCA. Cytotoxic edema but no hemorrhage. Stable trace midline shift.  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 R ICA terminus, R ACA, R MCA occluded.   CT head similar w/ low density and sweddling R ACA and MCA territories, now with 5-71mm midline shift. no hemorrhage  CT head acute R ACA and MCA infarct w/ progressive swelling decompressed through craniectomy defect.   CTA head and neck stable MLS. R ICA occlusion in neck that continues into R ACA and MCA. Mild petechial hemorrhage at basal ganglia  CT Head 8/29 - Unchanged appearance of massive right MCA and ACA territory infarcts with parenchyma extending through decompressive craniectomy. Small focus of suspected hemorrhage adjacent to the right caudate head.  CT Head WO - 07/30/19 - New hematoma within the infarcted tissue   CT repeat 08/01/2019 stable w/o change  2D Echo EF 60-65%  2D echo w/ bubble ordered no LRS   LE venous doppler DVT in R peroneal, L popliteal, L posterior tibial and L peroneal  LE venous doppler repeat DVT in R peroneal, L popliteal, L posterior tibial and L peroneal  CT chest Nonocclusive thrombus seen within the left posterior lower lobe segmental artery.   TCD bubble no HITS, no PFO  LDL 83  HgbA1c 5.4  UDS positive THC  Hypercoagulable and autoimmune work up negative  Heparin 1500 subq for VTE prophylaxis  No antithrombotic prior to admission, treated DVT with heparin IV, then  discontinued due to hemorrhagic conversion with right frontal hematoma, now resumed heparin IV given extensive DVT and PE  Therapy recommendations:  SNF  Disposition:  pending   Cyctotoxic cerebral edema  S/p R decompressive hemicraniectomy (Nundkumar) w/ flap R abd 07/19/2019  PICC placed - keep for now per Leonie Man  Given One dose of 23.4% 8/26; 3%  off  8/30 1700  Na 146 -> 140->135->136  On NS to 75cc and decrease free water to 150 Q8  Monitor Na daily  CT repeat 07/30/19 - New parenchymal hemorrhage within the infarcted tissue  Off helmet to further release pressure  CT repeat 08/01/2019 stable w/o change  Close neuro check since on heparin IV  Right ICA occlusion  MRA and CTA showed right ICA occlusion from origin to terminal  Wife denies any head trauma  Wife stated that pt had recent aggressive exercise with weight lifting  Concerning dissection as working diagnosis of stroke etiology   B LE DVT Small LLL PE  LE venous doppler DVT in R  peroneal, L popliteal, L posterior tibial and L peroneal  Etiology unclear  Started iv heparin 07/26/2019  IVC filter placed 8/27. Plan retrieval in 8-12 weeks as an IP in an IR clinic  Hypercoagulable work up negative   Off SCDs  LE venous doppler 08/02/19 repeat extensive DVT in R peroneal, L popliteal, L posterior tibial and L peroneal up to the IVC filter  CT chest Nonocclusive thrombus seen within the left posterior lower lobe segmental artery.   hydrated w/ IVF @ 75cc, continue free water and TF  Treated with IV Heparin but then off secondary to intracranial hemorrhage  Restarted IV heparin given extensive DVT and PE with close neuro monitoring. Pt remains stable.  Hematology consult, appreciate help - hypercoag w/u neg.  At risk for recurrent clots. AC recommended w/ lovenox at d/c.  Factor V Leiden, homocysteine, JAK2, Prot C&S, Prothombin gene mutation  pending from 08/04/19  Appreciate TRH assistance on this complicated case  Abdominal wall fluid collection  Large fluid collection seen on CT abdomen  Dr. Kathyrn Sheriff did fluid aspiration and seems to be old blood component  Fluid aspiration sent for culture 9/11 - no growth < 24 hrs  requested ID consult   Seizure-like activity  Continue Keppra   EEG continuous slowing, excessive beta activity, sleep spindle  asymmetry decreased R->related to stroke and sedation. No SZ  Long Term EEG cortical dysfunction in right frontal region  Seizure precautions  Febrile, Leukocytosis  Tmax - 101.8->99.7->101.8->101.5->99.6->98.3-100.8-102.1-103.9->103.3  WBC - 11.8->14.5->16.1->13.5->17.5->16.4-13.6-12.7->13.1  CXR x 2 NAD  U/A x 3 - negative. UCx neg   Blood cultures - neg, repeated again 08/04/19 -> no growth  on empiric unasyn -> changed to vanco, cefepime and flagyl 08/05/19  Lactic acid 1.2->2.0  CT abd/pel concerning for abdominal wall fluid collection likely old hematoma  Hepatitis panel 9/12 - pending   Tachycardia  HR 98 - 120  likely due to fever and dehydration and PE  Hydration with IVF, TF and free water  Increase metoprolol to 50 Q8h  EKG ST, rate 120 w/ possible LA enlargement  Troponin series neg x 3    Still more likely due to fever and SIRS  Hyperlipidemia  Home meds:  no statin  LDL 83, goal < 70  Was on lipitor 20 mg daily   D/c statin due to elevated LFT  Dysphagia . Secondary to stroke . NPO  Cortrak w/ TF @ 55cc  Decreased free water 150cc q4->q8   Speech on board - continue NPO  Other Stroke Risk Factors  Former Cigarette smoker, quit 3 yrs ago  ETOH use  Substance abuse UDS - positive for THC   Obesity, Body mass index is 31.85 kg/m., recommend weight loss, diet and exercise as appropriate   Other Active Problems  Elevated LFTs -  AST - 132->219->97->67 ; ALT - 68->244->174->116->80 - improving   Hypokalemia - 3.5->3.4 - supplement ->3.5- 3.2->3.4 - replaced - 4.5->4.2->3.9->3.6  Normocytic anemia - hematology felt d/t ongoing blood draws 11.9-11.2-10.3->9.4->9.4  Hospital day # 18 The patient remains febrile and having chills but neurologically appears to be stable.  So far all infectious work-up has been negative.  Patient is on antibiotics.  ID and internal medicine help appreciated.  I had a long discussion with the patient's  wife over the phone and informed her about patient's condition and work-up results and answered questions. This patient is critically ill due to right large infarct, cerebral edema, hemorrhagic conversion, extensive DVT, PE, tachycardia, right  carotid occlusion and at significant risk of neurological worsening, death form recurrent stroke, brain herniation, hemorrhagic conversion, PE, heart failure, respiratory failure, status epilepticus. This patient's care requires constant monitoring of vital signs, hemodynamics, respiratory and cardiac monitoring, review of multiple databases, neurological assessment, discussion with family, other specialists and medical decision making of high complexity. I spent 40 minutes of neurocritical care time in the care of this patient. I also discussed with Dr. Kathyrn Sheriff and Dr. Cyndia Skeeters.   Matthew Contras, MD   To contact Stroke Continuity provider, please refer to [email or web address] After hours, contact General Neurology

## 2019-08-06 NOTE — Progress Notes (Signed)
PROGRESS NOTE  Matthew Black [id number] DOB: January 20, 1976   PCP: Colon Branch, MD  Patient is from: home  DOA: 07/19/2019 LOS: 20  Brief Narrative / Interim history: "Matthew Black is an 43 y.o. male without significant known PMH who presented on 8/25 with AMS as well as L hemiparesis and aphasia.  UDS + for THC.  CT with large R hemispheric infarct with confluent cytotoxic edema in R ACA and MCA territories and mild mass effect.  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  He was admitted to [facility] with neurology consultation and started on 3% saline.  Neurosurgery was consulted and Dr. Kathyrn Sheriff recommended early decompressive hemicraniectomy, which was performed that night.   He subsequently developed hemorrhagic conversion with hematoma on 9/5; helmet was removed to further release intracranial pressure and heparin was discontinued. LE doppler also indicated DVT in R peroneal, L popliteal, L posterior tibial, and L peroneal veins; IVC filter was placed.  Patient had seizure-like activity, for which he is on Keppra.  He was subsequently found to have possible RLL infiltrate and with concern for aspiration he was made NPO and started on Unasyn.  CTA was performed and it showed a non-occlusive LLL thrombus on 9/7.  Subsequent repeat DVT US was performed 9/8 and showed acute R DVT of the peroneal veins as well as acute L DVT involving common femoral vein, L femoral vein, L proximal profunda vein, L popliteal vein, L posterior tibial veins, and L peroneal veins - and it extends up into the left iliac vein and IVC".  Heparin resumed on 9/8 and no adverse effect so far.  Hematology consulted as well. Patient spiked fever to 102.1 on 9/9 while on Unasyn.  Had some leukocytosis which seems to have improved.  Also mild tachycardia.  Recent blood culture 9/7- so far.  No clear source of infection.  CT abdomen and pelvis, CXR, UA, urine and blood cultures ordered.  UA and CXR not  impressive.  CT abdomen with large fluid collection in lower right anterior abdominal which could be hematoma, seroma or an abscess.  Neurosurgery consulted and tapped the abdominal hematoma.  Cultures negative so far.  Infectious disease consulted as well.  Subjective: Continues to have fever and shivering.  Slightly tachycardic.  Awake and alert and follows command today.   Objective: Vitals:   08/06/19 0738 08/06/19 0800 08/06/19 0900 08/06/19 1000  BP:  126/75 107/82 126/75  Pulse:  (!) 104 (!) 114 70  Resp:  20 (!) 23 (!) 30  Temp: (!) 101.5 F (38.6 C)     TempSrc: Core     SpO2:  100% 100% 99%  Weight:      Height:        Intake/Output Summary (Last 24 hours) at 08/06/2019 1118 Last data filed at 08/06/2019 1000 Gross per 24 hour  Intake 4527.33 ml  Output 2950 ml  Net 1577.33 ml   Filed Weights   08/04/19 0500 08/05/19 0455 08/06/19 0500  Weight: 100.7 kg 105.3 kg 103.6 kg    Examination:  GENERAL: Shivering. HEENT: MMM.  Cortrak in place.  Staples over his right head. NECK: Supple.  No apparent JVD.  RESP:  No IWOB. Good air movement bilaterally. CVS: Tachycardic.  Regular rhythm. Heart sounds normal.  ABD/GI/GU: Bowel sounds present. Soft. Non tender.  Surgical wound over right abdomen. MSK/EXT: Moves right extremities.  Does not move left extremities. SKIN: Surgical wound over his right head and right  abdomen. NEURO: Awake, alert .  Follows commands.  No facial asymmetry.  PERRL. PSYCH: Calm. Normal affect.   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  Heparin held briefly. -Heparin resumed without bolus on 9/8 after risk-benefit discussion with family. -H&H dropped but stable now. -Neurology and neurosurgery managing.   Bilateral lower extremity DVT Nonocclusive LLL PE  Abdominal wall hematoma-noted on CT A/P.  Stable. -IVC filter in place. -Heparin resumed on 9/8 after risk-benefit discussion -H&H stable now after initial drop. -Follow hypercoagulable labs.  Seizure-like activity: -Continue Keppra per neurology  SIRS/Fever/tachycardia/tachypnea/leukocytosis: extensive work-up without clear source yet.  Concern this could be related to his CVA and hemorrhage.  ID consulted. -All cultures negative so far. -Empiric Unasyn 9/6> 9/10 -Vancomycin, cefepime and Flagyl 9/10>>> -PRN tylenol and cooling blanket as needed -Add baclofen for shivering.  Could help with pain as well. -Avoid NSAIDs while on heparin drip  Elevated liver enzymes: Improving.  HIV negative. -Follow acute hepatitis panel -Check CK.  Acute blood loss anemia: H&H stable now after initial drop. -Continue monitoring  Dysphagia/metabolic encephalopathy likely due to massive CVA. -Tube feed via cortrack  DVT prophylaxis: On heparin drip Code Status: Full code Family Communication: Updated patient's wife over the phone this morning. Disposition Plan: Per primary Consultants: We are one of them.  Procedures:  Hemicraniectomy  Microbiology summarized: 8/25-SARS-CoV-2 screen negative 8/25-MRSA PCR negative 8/25-blood culture negative 8/25-urine culture negative 9/5-blood cultures negative 9/7-blood cultures negative 9/10-blood cultures negative 9/10-urine cultures negative 9/11-drain culture negative so far  Antimicrobials: Anti-infectives (From admission, onward)   Start     Dose/Rate Route Frequency Ordered Stop   08/05/19 1000  vancomycin (VANCOCIN) 1,250 mg in sodium chloride 0.9 % 250 mL IVPB     1,250 mg 166.7 mL/hr over 90 Minutes Intravenous Every 12 hours 08/04/19 1856     08/05/19 0600  ceFEPIme (MAXIPIME) 2 g in sodium chloride 0.9 % 100 mL IVPB     2 g 200 mL/hr over 30 Minutes Intravenous Every 8 hours  08/04/19 1856     08/04/19 2200  metroNIDAZOLE (FLAGYL) IVPB 500 mg     500 mg 100 mL/hr over 60 Minutes Intravenous Every 8 hours 08/04/19 1743     08/04/19 1830  vancomycin (VANCOCIN) 2,000 mg in sodium chloride 0.9 % 500 mL IVPB     2,000 mg 250 mL/hr over 120 Minutes Intravenous  Once 08/04/19 1805 08/04/19 2053   08/04/19 1815  ceFEPIme (MAXIPIME) 2 g in sodium chloride 0.9 % 100 mL IVPB     2 g 200 mL/hr over 30 Minutes Intravenous  Once 08/04/19 1805 08/04/19 1842   07/31/19 2200  Ampicillin-Sulbactam (UNASYN) 3 g in sodium chloride 0.9 % 100 mL IVPB  Status:  Discontinued     3 g 200 mL/hr over 30 Minutes Intravenous Every 6 hours 07/31/19 1401 08/04/19 1743   07/31/19 1415  Ampicillin-Sulbactam (UNASYN) 3 g in sodium chloride 0.9 % 100 mL IVPB     3 g 200 mL/hr over 30 Minutes Intravenous STAT 07/31/19 1401 07/31/19 1551   07/19/19 2315  bacitracin 50,000 Units in sodium chloride 0.9 % 500 mL irrigation  Status:  Discontinued       As needed 07/19/19 2315 07/19/19 2339      Sch Meds:  Scheduled Meds: . baclofen  10 mg Oral TID  . chlorhexidine  15 mL Mouth Rinse BID  . Chlorhexidine Gluconate Cloth  6 each Topical Daily  . famotidine  20 mg Per Tube BID  . feeding supplement (PRO-STAT SUGAR FREE 64)  30 mL Per Tube TID  . free water  150 mL Per Tube Q8H  . levETIRAcetam  500 mg Per Tube BID  . mouth rinse  15 mL Mouth Rinse q12n4p  . metoprolol tartrate  50 mg Per Tube Q8H  . sodium chloride flush  10-40 mL Intracatheter Q12H   Continuous Infusions: . sodium chloride 10 mL/hr at 08/01/19 1715  . sodium chloride 75 mL/hr at 08/06/19 1000  . ceFEPime (MAXIPIME) IV Stopped (08/06/19 [id number])  . feeding supplement (OSMOLITE 1.5 CAL) 1,000 mL (08/06/19 0200)  . heparin 1,350 Units/hr (08/06/19 1000)  . metronidazole Stopped (08/06/19 0752)  . vancomycin 166.7 mL/hr at 08/06/19 1000   PRN Meds:.sodium chloride, acetaminophen **OR** acetaminophen (TYLENOL) oral liquid 160  mg/5 mL **OR** acetaminophen, labetalol, senna-docusate, sodium chloride flush   I have personally reviewed the following labs and images: CBC: Recent Labs  Lab 08/02/19 0222 08/03/19 0512 08/04/19 0440 08/05/19 0457 08/06/19 0635  WBC 17.5* 16.4* 13.6* 12.7* 13.1*  HGB 11.9* 11.2* 10.3* 9.4* 9.4*  HCT 35.6* 34.3* 32.0* 29.9* 29.4*  MCV 85.8 87.3 88.6 87.4 88.0  PLT 198 185 204 222 221   BMP &GFR Recent Labs  Lab 08/01/19 0450 08/02/19 0222 08/03/19 0512 08/04/19 0440 08/05/19 0457 08/06/19 0635  NA 140 137 136 135  --  136  K 4.3 4.5 4.2 3.9  --  3.6  CL 104 105 104 103  --  103  CO2 25 24 22 22   --  23  GLUCOSE 136* 142* 138* 130*  --  134*  BUN 23* 24* 24* 23*  --  19  CREATININE 1.14 1.05 0.95 0.89  --  0.86  CALCIUM 8.8* 8.6* 8.7* 8.4*  --  8.4*  MG  --   --   --  2.1 2.1 2.0  PHOS  --   --   --  3.5 3.7 3.2   Estimated Creatinine Clearance: 138.5 mL/min (by C-G formula based on SCr of 0.86 mg/dL). Liver & Pancreas: Recent Labs  Lab 07/31/19 0715 08/02/19 0222 08/04/19 0440 08/06/19 0635  AST 97* 63* 67* 105*  ALT 174* 116* 80* 68*  ALKPHOS 95 94 94 85  BILITOT 0.7 0.8 0.7 0.6  PROT 8.0 7.2 7.2 7.3  ALBUMIN 3.4* 3.0* 2.8* 2.7*   No results for input(s): LIPASE, AMYLASE in the last 168 hours. No results for input(s): AMMONIA in the last 168 hours. Diabetic: No results for input(s): HGBA1C in the last 72 hours. Recent Labs  Lab 08/05/19 1534 08/05/19 1920 08/05/19 2327 08/06/19 0332 08/06/19 0740  GLUCAP 126* 127* 129* 120* 134*   Cardiac Enzymes: No results for input(s): CKTOTAL, CKMB, CKMBINDEX, TROPONINI in the last 168 hours. No results for input(s): PROBNP in the last 8760 hours. Coagulation Profile: No results for input(s): INR, PROTIME in the last 168 hours. Thyroid Function Tests: No results for input(s): TSH, T4TOTAL, FREET4, T3FREE, THYROIDAB in the last 72 hours. Lipid Profile: No results for input(s): CHOL, HDL, LDLCALC, TRIG,  CHOLHDL, LDLDIRECT in the last 72 hours. Anemia Panel: No results for input(s): VITAMINB12, FOLATE, FERRITIN, TIBC, IRON, RETICCTPCT in the last 72 hours. Urine analysis:    Component Value Date/Time   COLORURINE AMBER (A) 08/04/2019  [place] 08/04/2019 0759   LABSPEC 1.027 08/04/2019 0759   PHURINE 5.0 08/04/2019 0759   GLUCOSEU NEGATIVE 08/04/2019 0759   HGBUR NEGATIVE 08/04/2019 [place] 08/04/2019 [place] 08/04/2019 0759   PROTEINUR 30 (A) 08/04/2019 0759   NITRITE NEGATIVE 08/04/2019 0759   LEUKOCYTESUR NEGATIVE 08/04/2019 0759   Sepsis Labs: Invalid input(s): PROCALCITONIN, [place]  Microbiology: Recent Results (from the past 240 hour(s))  Culture, blood (routine x 2)     Status: None (Preliminary result)   Collection Time: 07/30/19  1:09 PM   Specimen: BLOOD LEFT ARM  Result Value Ref Range Status   Specimen Description BLOOD LEFT ARM  Final   Special Requests   Final    BOTTLES DRAWN AEROBIC AND ANAEROBIC Blood Culture adequate volume   Culture   Final    NO GROWTH 4 DAYS Performed at [facility], [address], [place], [place] [address]    Report Status PENDING  Incomplete  Culture, blood (routine x 2)     Status: None (Preliminary result)   Collection Time: 07/30/19  1:09 PM   Specimen: BLOOD RIGHT HAND  Result Value Ref Range Status   Specimen Description BLOOD RIGHT HAND  Final   Special Requests   Final    BOTTLES DRAWN AEROBIC ONLY Blood Culture results may not be optimal due to an inadequate volume of blood received in culture bottles   Culture   Final    NO GROWTH 4 DAYS Performed at [facility], [place] [address] [place], [place] [address]    Report Status PENDING  Incomplete  Culture, blood (Routine X 2) w Reflex to ID Panel     Status: None   Collection Time: 08/01/19  4:50 AM   Specimen: BLOOD  Result Value Ref Range Status   Specimen Description BLOOD PICC LINE  Final    Special Requests   Final    BOTTLES DRAWN AEROBIC ONLY Blood Culture adequate volume   Culture   Final    NO GROWTH 5 DAYS Performed at [facility], [place] [address] [place], [place] [address]    Report Status 08/06/2019 FINAL  Final  Culture, blood (Routine X 2) w Reflex to ID Panel     Status: None   Collection Time: 08/01/19  4:50 AM   Specimen: BLOOD RIGHT HAND  Result Value Ref Range Status   Specimen Description BLOOD RIGHT HAND  Final   Special Requests   Final    BOTTLES DRAWN AEROBIC ONLY Blood Culture adequate volume   Culture   Final    NO GROWTH 5 DAYS Performed at [facility], [place] [address], [place], [place] [address]    Report Status 08/06/2019 FINAL  Final  Culture, Urine     Status: None   Collection Time: 08/04/19 12:48 PM   Specimen: Urine, Random  Result Value Ref Range Status   Specimen Description URINE, RANDOM  Final   Special Requests NONE  Final   Culture   Final    NO GROWTH Performed at [facility], [place] [address], [place], [place] [address]    Report Status 08/05/2019 FINAL  Final  Culture, blood (routine x 2)     Status: None (Preliminary result)   Collection Time: 08/04/19  6:59 PM   Specimen: BLOOD LEFT ARM  Result Value Ref Range Status   Specimen Description BLOOD LEFT ARM  Final  Special Requests   Final    BOTTLES DRAWN AEROBIC ONLY Blood Culture results may not be optimal due to an inadequate volume of blood received in culture bottles   Culture   Final    NO GROWTH 2 DAYS Performed at [facility], [place] [address] [place], [place] [address]    Report Status PENDING  Incomplete  Culture, blood (routine x 2)     Status: None (Preliminary result)   Collection Time: 08/04/19  6:59 PM   Specimen: BLOOD LEFT HAND  Result Value Ref Range Status   Specimen Description BLOOD LEFT HAND  Final   Special Requests   Final    BOTTLES DRAWN AEROBIC ONLY Blood Culture results may not be optimal due to an inadequate volume  of blood received in culture bottles   Culture   Final    NO GROWTH 2 DAYS Performed at [facility], [place] [address] [place], [place] [address]    Report Status PENDING  Incomplete  Body fluid culture     Status: None (Preliminary result)   Collection Time: 08/05/19 11:33 AM   Specimen: Body Fluid  Result Value Ref Range Status   Specimen Description FLUID ABDOMEN  Final   Special Requests NONE  Final   Gram Stain   Final    RARE WBC PRESENT,BOTH PMN AND MONONUCLEAR NO ORGANISMS SEEN    Culture   Final    NO GROWTH < 24 HOURS Performed at [facility], [place] [address] [place], [place] [address]    Report Status PENDING  Incomplete    Radiology Studies: No results found.  Matthew Black  If 7PM-7AM, please contact night-coverage [email or web address] Password [email or web address] 08/06/2019, 11:18 AM

## 2019-08-06 NOTE — Progress Notes (Signed)
ANTICOAGULATION CONSULT NOTE  Pharmacy Consult:  Heparin Indication:  DVT/PE  Allergies  Allergen Reactions  . Hydrocodone Nausea Only    Patient Measurements: Height: 5\' 11"  (180.3 cm) Weight: 228 lb 6.3 oz (103.6 kg) IBW/kg (Calculated) : 75.3 Heparin Dosing Weight: 96 kg  Vital Signs: Temp: 101.5 F (38.6 C) (09/12 0738) Temp Source: Core (09/12 0738) BP: 126/75 (09/12 1000) Pulse Rate: 70 (09/12 1000)  Labs: Recent Labs    08/04/19 0440  08/04/19 2249 08/05/19 0457 08/06/19 0635  HGB 10.3*  --   --  9.4* 9.4*  HCT 32.0*  --   --  29.9* 29.4*  PLT 204  --   --  222 221  HEPARINUNFRC 0.22*   < > 0.43 0.45 0.42  CREATININE 0.89  --   --   --  0.86   < > = values in this interval not displayed.    Estimated Creatinine Clearance: 138.5 mL/min (by C-G formula based on SCr of 0.86 mg/dL).   Assessment: 54 YOM presented with acute ischemic CVA and did not receive tPA.  There was an incidental finding of DVT and IVC filter was placed on 07/21/19 as patient was not a candidate for [facility] due to craniectomy.  He was cleared to start IV heparin, but then stopped on 07/30/19 due to right frontal lobe hemorrhage.  CTA on 08/01/19 showed non-occlusive thrombus and repeat Doppler on 08/02/19 positive for bilateral DVTs.  Heparin restarted on 08/02/19 given extensive DVTs and PE.    Heparin level therapeutic at 0.42, CBC stable . CT A/P showed large fluid collection on abdominal wall that could represent post-op hematoma. No other bleeding noted.  Goal of Therapy:  Heparin level 0.3-0.5 units/ml Monitor platelets by anticoagulation protocol: Yes    Plan:   - Continue Heparin at 1350 units/hr (13.5 ml/hr) - Daily heparin level and CBC - Will continue to monitor for any signs/symptoms of bleeding   Levester Fresh, PharmD, BCPS, BCCCP Clinical Pharmacist [phone]  Please check AMION for all [place] numbers  08/06/2019 10:12 AM

## 2019-08-07 LAB — CBC
HCT: 29.9 % — ABNORMAL LOW (ref 39.0–52.0)
Hemoglobin: 9.7 g/dL — ABNORMAL LOW (ref 13.0–17.0)
MCH: 28.1 pg (ref 26.0–34.0)
MCHC: 32.4 g/dL (ref 30.0–36.0)
MCV: 86.7 fL (ref 80.0–100.0)
Platelets: 282 10*3/uL (ref 150–400)
RBC: 3.45 MIL/uL — ABNORMAL LOW (ref 4.22–5.81)
RDW: 13.6 % (ref 11.5–15.5)
WBC: 15.1 10*3/uL — ABNORMAL HIGH (ref 4.0–10.5)
nRBC: 0 % (ref 0.0–0.2)

## 2019-08-07 LAB — GLUCOSE, CAPILLARY
Glucose-Capillary: 120 mg/dL — ABNORMAL HIGH (ref 70–99)
Glucose-Capillary: 150 mg/dL — ABNORMAL HIGH (ref 70–99)
Glucose-Capillary: 101 mg/dL — ABNORMAL HIGH (ref 70–99)
Glucose-Capillary: 124 mg/dL — ABNORMAL HIGH (ref 70–99)
Glucose-Capillary: 116 mg/dL — ABNORMAL HIGH (ref 70–99)
Glucose-Capillary: 113 mg/dL — ABNORMAL HIGH (ref 70–99)

## 2019-08-07 LAB — COMPREHENSIVE METABOLIC PANEL
ALT: 60 U/L — ABNORMAL HIGH (ref 0–44)
AST: 118 U/L — ABNORMAL HIGH (ref 15–41)
Albumin: 2.6 g/dL — ABNORMAL LOW (ref 3.5–5.0)
Alkaline Phosphatase: 79 U/L (ref 38–126)
Anion gap: 6 (ref 5–15)
BUN: 17 mg/dL (ref 6–20)
CO2: 24 mmol/L (ref 22–32)
Calcium: 8.2 mg/dL — ABNORMAL LOW (ref 8.9–10.3)
Chloride: 106 mmol/L (ref 98–111)
Creatinine, Ser: 0.77 mg/dL (ref 0.61–1.24)
GFR calc Af Amer: >60 mL/min (ref >60)
GFR calc non Af Amer: >60 mL/min (ref >60)
Glucose, Bld: 123 mg/dL — ABNORMAL HIGH (ref 70–99)
Potassium: 3.6 mmol/L (ref 3.5–5.1)
Sodium: 136 mmol/L (ref 135–145)
Total Bilirubin: 1.2 mg/dL (ref 0.3–1.2)
Total Protein: 6.5 g/dL (ref 6.5–8.1)

## 2019-08-07 LAB — PROTEIN S, TOTAL: Protein S Ag, Total: 138 % (ref 60–150)

## 2019-08-07 LAB — CK: Total CK: 3198 U/L — ABNORMAL HIGH (ref 49–397)

## 2019-08-07 LAB — HEPARIN LEVEL (UNFRACTIONATED)
Heparin Unfractionated: 0.63 IU/mL (ref 0.30–0.70)
Heparin Unfractionated: 0.58 IU/mL (ref 0.30–0.70)

## 2019-08-07 LAB — MAGNESIUM: Magnesium: 2.1 mg/dL (ref 1.7–2.4)

## 2019-08-07 LAB — PHOSPHORUS: Phosphorus: 3.2 mg/dL (ref 2.5–4.6)

## 2019-08-07 LAB — PROTEIN C ACTIVITY: Protein C Activity: 127 % (ref 73–180)

## 2019-08-07 LAB — PROTEIN S ACTIVITY: Protein S Activity: 78 % (ref 63–140)

## 2019-08-07 MED ORDER — HEPARIN (PORCINE) 25000 UT/250ML-% IV SOLN
1150.0000 [IU]/h | INTRAVENOUS | Status: DC
  Administered 2019-08-08 – 2019-08-09 (×2): 1150 [IU]/h via INTRAVENOUS
  Administered 2019-08-10 – 2019-08-12 (×3): 1250 [IU]/h via INTRAVENOUS
  Administered 2019-08-12: 1400 [IU]/h via INTRAVENOUS
  Administered 2019-08-14: 1500 [IU]/h via INTRAVENOUS
  Filled 2019-08-07 (×9): qty 250

## 2019-08-07 MED ORDER — BROMOCRIPTINE MESYLATE 2.5 MG PO TABS
2.5000 mg | ORAL_TABLET | Freq: Two times a day (BID) | ORAL | Status: DC
  Administered 2019-08-07 – 2019-08-11 (×9): 2.5 mg via ORAL
  Filled 2019-08-07 (×9): qty 1

## 2019-08-07 NOTE — Progress Notes (Signed)
ANTICOAGULATION CONSULT NOTE  Pharmacy Consult:  Heparin Indication:  DVT/PE  Allergies  Allergen Reactions  . Hydrocodone Nausea Only    Patient Measurements: Height: 5\' 11"  (180.3 cm) Weight: 228 lb 6.3 oz (103.6 kg) IBW/kg (Calculated) : 75.3 Heparin Dosing Weight: 96 kg  Vital Signs: Temp: 100.8 F (38.2 C) (09/13 1601) Temp Source: Core (09/13 1601) BP: 123/79 (09/13 1600) Pulse Rate: 100 (09/13 1600)  Labs: Recent Labs    08/05/19 0457 08/06/19 0635 08/06/19 1203 08/07/19 0300 08/07/19 0520 08/07/19 1608  HGB 9.4* 9.4*  --  9.7*  --   --   HCT 29.9* 29.4*  --  29.9*  --   --   PLT 222 221  --  282  --   --   HEPARINUNFRC 0.45 0.42  --   --  0.63 0.58  CREATININE  --  0.86  --  0.77  --   --   CKTOTAL  --   --  2,594* 3,198*  --   --     Estimated Creatinine Clearance: 148.8 mL/min (by C-G formula based on SCr of 0.77 mg/dL).   Assessment: 8 YOM presented with acute ischemic CVA and did not receive tPA.  There was an incidental finding of DVT and IVC filter was placed on 07/21/19 as patient was not a candidate for [facility] due to craniectomy.  He was cleared to start IV heparin, but then stopped on 07/30/19 due to right frontal lobe hemorrhage.  CTA on 08/01/19 showed non-occlusive thrombus and repeat Doppler on 08/02/19 positive for bilateral DVTs.  Heparin restarted on 08/02/19 given extensive DVTs and PE.  CT abd shows possible hematoma and continuing heparin at this time. -heparin level= 0.58   Goal of Therapy:  Heparin level 0.3-0.5 units/ml Monitor platelets by anticoagulation protocol: Yes    Plan:   -Decrease heparin to 1100 units/hr -Daily heparin level and CBC  Hildred Laser, PharmD Clinical Pharmacist **Pharmacist phone directory can now be found on [email or web address] ([email or web address]).  Listed under [place].

## 2019-08-07 NOTE — Progress Notes (Signed)
STROKE TEAM PROGRESS NOTE   INTERVAL HISTORY Pt continues to have fever and chills on broad spectrum antibiotics.  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  Will consider adding bromocriptine as well today  Vitals:   08/07/19 0013 08/07/19 0323 08/07/19 0518 08/07/19 0626  BP: (!) 136/112   127/86  Pulse:    (!) 108  Resp:      Temp:  (!) 101.8 F (38.8 C) (!) 102 F (38.9 C)   TempSrc:  Core Core   SpO2:      Weight:      Height:        CBC:  Recent Labs  Lab 08/06/19 0635 08/07/19 0300  WBC 13.1* 15.1*  HGB 9.4* 9.7*  HCT 29.4* 29.9*  MCV 88.0 86.7  PLT 221 [id number]    Basic Metabolic Panel:  Recent Labs  Lab 08/06/19 0635 08/07/19 0300  NA 136 136  K 3.6 3.6  CL 103 106  CO2 23 24  GLUCOSE 134* 123*  BUN 19 17  CREATININE 0.86 0.77  CALCIUM 8.4* 8.2*  MG 2.0 2.1  PHOS 3.2 3.2    IMAGING Ct Head Wo Contrast Ct Cervical Spine Wo Contrast  07/19/2019 1132 1. 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 2. No associated hemorrhage and mild intracranial mass effect at this time, including trace leftward midline shift. 3. Evidence of large vessel occlusion: Hyperdensity of the right ICA terminus, the right A1 and MCA. 4. Unaffected brain parenchyma appears negative. 5.  No acute traumatic injury identified in the cervical spine.   Mr Brain 48 Contrast Mr Angio Head Wo Contrast Mr Angio Neck Wo Contrast 07/19/2019 1330 1. 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 The right ICA terminus, right ACA and MCA also appear occluded. 2. 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 Cytotoxic edema but no convincing hemorrhage. Stable mild mass effect with trace leftward midline shift. 3. Intracranial MRA is degraded by motion artifact, but no other large vessel occlusion is suspected. There appears to be lobes reperfusion of the right PCA. 4. The  left hemisphere and posterior fossa brain parenchyma appears normal.   Ct Head Wo Contrast 07/19/2019 1532 Similar appearance to earlier. Low-density and swelling of the right hemisphere in the ACA and MCA territories with mass effect and right-to-left shift of 5-6 mm. No hemorrhagic transformation at this time.   Ct Head Wo Contrast 07/20/2019 0444 1. Acute right ACA and MCA territory infarct with progressive swelling that has decompressed through the craniectomy defect. 2. No acute hemorrhage or new infarction.   Ct Angio Head W Or Wo Contrast Ct Angio Neck W Or Wo Contrast 07/21/2019 1. Stable from prior MRA. 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 No evidence of atherosclerosis or vasculopathy in the other vessels. 2. Cytotoxic edema causes 5 mm of midline shift and brain bulging through the craniectomy defect. Mild petechial hemorrhage is seen at the basal ganglia.   Ct Head Wo Contrast 07/23/2019 1. Unchanged appearance of massive right MCA and ACA territory infarcts with parenchyma extending through decompressive craniectomy. 2. Small focus of suspected hemorrhage adjacent to the right caudate head.  Ct Head Wo Contrast 07/30/2019 1. Decreasing mass effect within large right anterior MCA and ACA territory infarct. 2. Midline shift is no longer present. There is decreased effacement of  the right lateral ventricle. 3. Infarcted brain tissue still herniates through the craniectomy site. 4. New parenchymal hemorrhage within the infarcted tissue anteriorly measures 2.5 x 2.3 x 2.7 cm. 5. No new infarct.  Ct Head Wo Contrast 08/01/2019  1. Stable head CT since 07/30/2019. 2. Again noted is a large infarct involving the right MCA and right ACA territories with brain tissue herniating through the right craniotomy defect. 3. Parenchymal hemorrhage in the right anterior cortex has minimally changed. Stable petechial hemorrhage in the right basal  ganglia region.   Dg Chest Port 1 View 08/04/2019 Well-positioned support structures. No active cardiopulmonary disease.  08/01/2019 No acute disease   Ct Angio Chest Pe W Or Wo Contrast 08/01/2019 Nonocclusive thrombus seen within the left posterior lower lobe segmental artery.    2D Echocardiogram 07/20/2019  1. The left ventricle has normal systolic function with an ejection fraction of 60-65%. The cavity size was normal. Left ventricular diastolic parameters were normal.  2. The right ventricle has normal systolic function. The cavity was normal. There is no increase in right ventricular wall thickness.  3. The pericardial effusion is circumferential.  4. Trivial pericardial effusion is present.  5. The mitral valve is grossly normal.  6. The tricuspid valve is grossly normal.  7. The aortic valve is tricuspid. No stenosis of the aortic valve.  8. The aorta is normal unless otherwise noted.  9. The aortic root is normal in size and structure. 10. No cardiac source of embolism identified. 11. When compared to the prior study: No comparison.  LE Dopplers 08/02/2019 Right: Findings consistent with acute deep vein thrombosis involving the right peroneal veins.  Left: Findings consistent with acute deep vein thrombosis involving the left common femoral vein, left femoral vein, left proximal profunda vein, left popliteal vein, left posterior tibial veins, and left peroneal veins. Extending up into left iliac vein  and IVC.  07/21/2019 Right: Findings consistent with acute deep vein thrombosis involving the right peroneal veins. Left: Findings consistent with acute deep vein thrombosis involving the left popliteal vein, left posterior tibial veins, and left peroneal veins.  Vas [place] Transcranial Doppler W Bubbles 07/25/2019 No HITS heard heard at rest. No HITS heard heard during valsalva.   2D echo w/bubble 08/03/2019 Limited bubble study for shunt. No evidence for atrial level right to left  shunt.  Ct Abdomen Pelvis W Contrast 08/04/2019 1. Large fluid collection in the lower right anterior abdominal wall subcutaneous fat with what appears to be an internal surgical drain, favored to represent a postoperative hematoma or proteinaceous seroma, however, the possibility of an abscess is not excluded in light of the patient's fever.  2. Small high attenuation fluid collection along the left pelvic sidewall which likely represents a hematoma, as above.  3. Additional incidental findings, as above.   PHYSICAL EXAM   General - Well nourished, well developed, middle-aged African-American male.  He has right hemicraniectomy surgical incision on the scalp. Has chills during rounding  Ophthalmologic - fundi not visualized due to noncooperation.  Cardiovascular - Regular rhythm, tachycardia.  Neuro - patient is wake, still has chills but diminished, eyes open spontaneously. Nonverbal, but able to follow all simple commands on the right hand and foot. Eyes in right gaze position, barely cross midline, not blinking to visual threat to the left, PERRL. Left facial droop.  Tongue protrusion not corporative.  Has spontaneous movement of RUE against gravity 4/5 at least and RLE in bed 2+/5 at least. On pain stimulation,  mild withdraw of LLE, but no movement of LUE. DTR 1+ and no babinski. Sensation, coordination and gait not tested.   ASSESSMENT/PLAN Matthew Black is a 43 y.o. male with no significant past medical history found down x 2 days nonverbal with L hemiparesis.   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 Trace L midline shift. ELVO at R ICA, R A1, R MCA.  MRI  Large R brain infarct sparing R PCA. Cytotoxic edema but no hemorrhage. Stable trace midline shift.  MRA head and  neck R ICA occluded at origin. R ICA terminus, R ACA, R MCA occluded.   CT head similar w/ low density and sweddling R ACA and MCA territories, now with 5-16mm midline shift. no hemorrhage  CT head acute R ACA and MCA infarct w/ progressive swelling decompressed through craniectomy defect.   CTA head and neck stable MLS. R ICA occlusion in neck that continues into R ACA and MCA. Mild petechial hemorrhage at basal ganglia  CT Head 8/29 - Unchanged appearance of massive right MCA and ACA territory infarcts with parenchyma extending through decompressive craniectomy. Small focus of suspected hemorrhage adjacent to the right caudate head.  CT Head WO - 07/30/19 - New hematoma within the infarcted tissue   CT repeat 08/01/2019 stable w/o change  2D Echo EF 60-65%  2D echo w/ bubble ordered no LRS   LE venous doppler DVT in R peroneal, L popliteal, L posterior tibial and L peroneal  LE venous doppler repeat DVT in R peroneal, L popliteal, L posterior tibial and L peroneal  CT chest Nonocclusive thrombus seen within the left posterior lower lobe segmental artery.   TCD bubble no HITS, no PFO  LDL 83  HgbA1c 5.4  UDS positive THC  Hypercoagulable and autoimmune work up negative  Heparin 1500 subq for VTE prophylaxis  No antithrombotic prior to admission, treated DVT with heparin IV, then  discontinued due to hemorrhagic conversion with right frontal hematoma, now resumed heparin IV given extensive DVT and PE  Therapy recommendations:  SNF  Disposition:  pending   Cyctotoxic cerebral edema  S/p R decompressive hemicraniectomy (Nundkumar) w/ flap R abd 07/19/2019  PICC placed - keep for now per Leonie Man  Given One dose of 23.4% 8/26; 3%  off 8/30 1700  Na 146 -> 140->135->136  On NS to 75cc and decrease free water to 150 Q8  Monitor Na daily  CT repeat 07/30/19 - New parenchymal hemorrhage within the infarcted tissue  Off helmet to further release pressure  CT repeat 08/01/2019  stable w/o change  Close neuro check since on heparin IV  Right ICA occlusion  MRA and CTA showed right ICA occlusion from origin to terminal  Wife denies any head trauma  Wife stated that pt had recent aggressive exercise with weight lifting  Concerning dissection as working diagnosis of stroke etiology   B LE DVT Small LLL PE  LE venous doppler DVT in R peroneal, L popliteal, L posterior tibial and L peroneal  Etiology unclear  Started iv heparin 07/26/2019  IVC filter placed 8/27. Plan retrieval in 8-12 weeks as an IP in an IR clinic  Hypercoagulable work up negative   Off SCDs  LE venous doppler 08/02/19 repeat extensive DVT in R peroneal, L popliteal,  L posterior tibial and L peroneal up to the IVC filter  CT chest Nonocclusive thrombus seen within the left posterior lower lobe segmental artery.   hydrated w/ IVF @ 75cc, continue free water and TF  Treated with IV Heparin but then off secondary to intracranial hemorrhage  Restarted IV heparin given extensive DVT and PE with close neuro monitoring. Pt remains stable.  Hematology consult, appreciate help - hypercoag w/u neg.  At risk for recurrent clots. AC recommended w/ lovenox at d/c.  Factor V Leiden, homocysteine, JAK2, Prot C&S, Prothombin gene mutation  pending from 08/04/19  Appreciate TRH assistance on this complicated case  Abdominal wall fluid collection  Large fluid collection seen on CT abdomen  Dr. Kathyrn Sheriff did fluid aspiration and seems to be old blood component  Fluid aspiration sent for culture 9/11 - no growth < 24 hrs  requested ID consult   Seizure-like activity  Continue Keppra   EEG continuous slowing, excessive beta activity, sleep spindle asymmetry decreased R->related to stroke and sedation. No SZ  Long Term EEG cortical dysfunction in right frontal region  Seizure precautions  Febrile, Leukocytosis  Tmax - 101.8->99.7->101.8->101.5->99.6->98.3-100.8-102.1-103.9->103.3  WBC  - 11.8->14.5->16.1->13.5->17.5->16.4-13.6-12.7->13.1  CXR x 2 NAD  U/A x 3 - negative. UCx neg   Blood cultures - neg, repeated again 08/04/19 -> no growth on empiric unasyn -> changed to vanco, cefepime and flagyl 08/05/19  Lactic acid 1.2->2.0  CT abd/pel concerning for abdominal wall fluid collection likely old hematoma  Hepatitis panel 9/12 - pending   Tachycardia  HR 98 - 120  likely due to fever and dehydration and PE  Hydration with IVF, TF and free water  Increase metoprolol to 50 Q8h  EKG ST, rate 120 w/ possible LA enlargement  Troponin series neg x 3    Still more likely due to fever and SIRS  Hyperlipidemia  Home meds:  no statin  LDL 83, goal < 70  Was on lipitor 20 mg daily   D/c statin due to elevated LFT  Dysphagia . Secondary to stroke . NPO  Cortrak w/ TF @ 55cc  Decreased free water 150cc q4->q8   Speech on board - continue NPO  Other Stroke Risk Factors  Former Cigarette smoker, quit 3 yrs ago  ETOH use  Substance abuse UDS - positive for THC   Obesity, Body mass index is 31.85 kg/m., recommend weight loss, diet and exercise as appropriate   Other Active Problems  Elevated LFTs -  AST - 132->219->97->67 ; ALT - 68->244->174->116->80 - improving   Hypokalemia - 3.5->3.4 - supplement ->3.5- 3.2->3.4 - replaced - 4.5->4.2->3.9->3.6  Normocytic anemia - hematology felt d/t ongoing blood draws 11.9-11.2-10.3->9.4->9.4  Hospital day # 19 The patient remains febrile and having chills but neurologically appears to be stable.  So far all infectious work-up has been negative.  Patient is on antibiotics.  ID and internal medicine help appreciated.  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  Baclofen has been started but has not been effective.  Will add bromocriptine as well today.  I had a long discussion with Dr. Tawanna Solo and answered questions. This patient is critically ill due to right large infarct, cerebral edema,  hemorrhagic conversion, extensive DVT, PE, tachycardia, right carotid occlusion and at significant risk of neurological worsening, death form recurrent stroke, brain herniation, hemorrhagic conversion, PE, heart failure, respiratory failure, status epilepticus. This patient's care requires constant monitoring of vital signs, hemodynamics, respiratory and cardiac monitoring, review of multiple databases, neurological assessment,  discussion with family, other specialists and medical decision making of high complexity. I spent 30 minutes of neurocritical care time in the care of this patient.  Antony Contras, MD  To contact Stroke Continuity provider, please refer to [email or web address] After hours, contact General Neurology

## 2019-08-07 NOTE — Progress Notes (Addendum)
PROGRESS NOTE    Matthew Black  [id number] DOB: January 14, 1976 DOA: 07/19/2019 PCP: Colon Branch, MD   Brief Narrative:  Matthew Reeves Tuckeris an 43 y.o.malewithout significant known PMH who presented on 8/25 with AMS as well as L hemiparesis and aphasia. UDS + for THC. CT with large R hemispheric infarct with confluent cytotoxic edema in R ACA and MCA territories and mild mass effect. 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 He was admitted to [facility] with neurology consultation and started on 3% saline. Neurosurgery was consulted and Dr. Kathyrn Sheriff recommended early decompressive hemicraniectomy, which was performed that night. He subsequently developed hemorrhagic conversion with hematoma on 9/5; helmet was removed to further release intracranial pressure and heparin was discontinued. LE doppler also indicated DVT in R peroneal, L popliteal, L posterior tibial, and L peroneal veins; IVC filter was placed.  Patient had seizure-like activity, for which he is on Keppra. He was subsequently found to have possible RLL infiltrate and with concern for aspiration he was made NPO and started on Unasyn. CTA was performed and it showed a non-occlusive LLL thrombus on 9/7.Subsequent repeat DVT US was performed 9/8 and showed acute R DVT of the peroneal veins as well as acute L DVT involving common femoral vein, L femoral vein, L proximal profunda vein, L popliteal vein, L posterior tibial veins, and L peroneal veins - and it extends up into the left iliac vein and IVC".  Heparin resumed on 9/8 and no adverse effect so far.  Hematology consulted as well. Patient spiked fever to 102.1 on 9/9 while on Unasyn.  Had some leukocytosis which seems to have improved.  Also mild tachycardia.  Recent blood culture 9/7- so far.  No clear source of infection.  CT abdomen and pelvis, CXR, UA, urine and blood cultures ordered.  UA and CXR not impressive.  CT abdomen with large fluid collection in  lower right anterior abdominal which could be hematoma, seroma or an abscess.  Neurosurgery consulted and tapped the abdominal hematoma.  Cultures negative so far.  Infectious disease consulted as well.   Assessment & Plan:   Active Problems:   Stroke (cerebrum) (HCC)   Pressure injury of skin   Acute respiratory failure (HCC)   Endotracheal tube present   Deep vein thrombosis (DVT) of non-extremity vein   Hypokalemia   FUO (fever of unknown origin)   Acute blood loss anemia   SIRS (systemic inflammatory response syndrome) (HCC)   Leukocytosis   Primary hypercoagulable state ([place])   Acute pulmonary embolism without acute cor pulmonale (HCC)   Right MCA/ACA infarct with left hemiparesis: Patient is still encephalopathic.  Acute metabolic encephalopathy due to massive CVA.  Has right ICA, MCA and ACA occlusions.  Status post hemicraniectomy with abdominal flap implant.  Hemorrhagic conversion of CVA when he started on heparin for DVT.  Heparin has been resumed.  Neurology/neurosurgery managing.  Bilateral lower extremity DVT/nonocclusive left lower lobe PE: Currently on heparin.  Also has IVC filter.  Stable abdominal wall hematoma under the abdominal flap incision site.  Seizure-like activity: On Keppra as per neurology.  SIRS/fever/tachycardia/tachypnea/leukocytosis: Still febrile.  Extensive work-up did not reveal any clear source of infection.  Most likely associated  with hemorrhagic CVA.  ID following.  All cultures negative so far.  Currently on broad-spectrum antibiotics with vancomycin ,cefepime  since 9/10.  On Tylenol, baclofen for severe pain.Has leucocytosis.  Abdominal hematoma: CT abdomen with large fluid collection in lower right anterior  abdominal which could be hematoma, seroma or an abscess.  Neurosurgery consulted and tapped the abdominal hematoma.  Cultures negative so far.  Elevated CK/Elevated liver enzymes:Found on the floor.  From rhabdomyolysis.  Also has mild  elevated liver enzymes.  Hepatitis panel pending.  Continue current IV fluids.  Normocytic anemia: Currently H&H stable.  Dysphagia: Encephalopathy due to massive CVA.  On feeding tube.  Nutrition Problem: Inadequate oral intake Etiology: acute illness      DVT prophylaxis: Heparin IV Code Status: Full Family Communication: None present at the bedside Disposition Plan: Undetermined at this point   Procedures: Hemicraniectomy  Antimicrobials:  Anti-infectives (From admission, onward)   Start     Dose/Rate Route Frequency Ordered Stop   08/05/19 1000  vancomycin (VANCOCIN) 1,250 mg in sodium chloride 0.9 % 250 mL IVPB     1,250 mg 166.7 mL/hr over 90 Minutes Intravenous Every 12 hours 08/04/19 1856     08/05/19 0600  ceFEPIme (MAXIPIME) 2 g in sodium chloride 0.9 % 100 mL IVPB     2 g 200 mL/hr over 30 Minutes Intravenous Every 8 hours 08/04/19 1856     08/04/19 2200  metroNIDAZOLE (FLAGYL) IVPB 500 mg  Status:  Discontinued     500 mg 100 mL/hr over 60 Minutes Intravenous Every 8 hours 08/04/19 1743 08/06/19 1548   08/04/19 1830  vancomycin (VANCOCIN) 2,000 mg in sodium chloride 0.9 % 500 mL IVPB     2,000 mg 250 mL/hr over 120 Minutes Intravenous  Once 08/04/19 1805 08/04/19 2053   08/04/19 1815  ceFEPIme (MAXIPIME) 2 g in sodium chloride 0.9 % 100 mL IVPB     2 g 200 mL/hr over 30 Minutes Intravenous  Once 08/04/19 1805 08/04/19 1842   07/31/19 2200  Ampicillin-Sulbactam (UNASYN) 3 g in sodium chloride 0.9 % 100 mL IVPB  Status:  Discontinued     3 g 200 mL/hr over 30 Minutes Intravenous Every 6 hours 07/31/19 1401 08/04/19 1743   07/31/19 1415  Ampicillin-Sulbactam (UNASYN) 3 g in sodium chloride 0.9 % 100 mL IVPB     3 g 200 mL/hr over 30 Minutes Intravenous STAT 07/31/19 1401 07/31/19 1551   07/19/19 2315  bacitracin 50,000 Units in sodium chloride 0.9 % 500 mL irrigation  Status:  Discontinued       As needed 07/19/19 2315 07/19/19 2339       Subjective:  Patient seen and examined the bedside this morning.  Hemodynamically stable.  And profound encephalopathy.  Does not respond or follows commands.  Nonpurposeful movement.  Objective: Vitals:   08/07/19 0700 08/07/19 0734 08/07/19 0800 08/07/19 0900  BP: 129/75  119/78 108/82  Pulse: (!) 116  (!) 101 (!) 102  Resp: (!) 21  20 18   Temp:  (!) 102.2 F (39 C)    TempSrc:  Core    SpO2: 100%  100% 100%  Weight:      Height:        Intake/Output Summary (Last 24 hours) at 08/07/2019 1108 Last data filed at 08/07/2019 0900 Gross per 24 hour  Intake 4063.08 ml  Output 2500 ml  Net 1563.08 ml   Filed Weights   08/04/19 0500 08/05/19 0455 08/06/19 0500  Weight: 100.7 kg 105.3 kg 103.6 kg    Examination:  General exam: Encephalopathic, unresponsive  HEENT: Eyes open, does not follow, feeding tube staples on the scalp Respiratory system: Bilateral equal air entry, normal vesicular breath sounds, no wheezes or crackles  Cardiovascular system: S1 &  S2 heard, RRR. No JVD, murmurs, rubs, gallops or clicks. No pedal edema. Gastrointestinal system: Abdomen is mildly distended, soft and nontender. No organomegaly or masses felt. Normal bowel sounds heard.  Surgical wound on the right lower quadrant with the staples. Central nervous system: Not Alert or oriented.  Extremities: No edema, no clubbing ,no cyanosis, distal peripheral pulses palpable. Skin: No rashes, lesions or ulcers,no icterus ,no pallor    Data Reviewed: I have personally reviewed following labs and imaging studies  CBC: Recent Labs  Lab 08/03/19 0512 08/04/19 0440 08/05/19 0457 08/06/19 0635 08/07/19 0300  WBC 16.4* 13.6* 12.7* 13.1* 15.1*  HGB 11.2* 10.3* 9.4* 9.4* 9.7*  HCT 34.3* 32.0* 29.9* 29.4* 29.9*  MCV 87.3 88.6 87.4 88.0 86.7  PLT 185 204 222 221 [id number]   Basic Metabolic Panel: Recent Labs  Lab 08/02/19 0222 08/03/19 0512 08/04/19 0440 08/05/19 0457 08/06/19 0635 08/07/19 0300   NA 137 136 135  --  136 136  K 4.5 4.2 3.9  --  3.6 3.6  CL 105 104 103  --  103 106  CO2 24 22 22   --  23 24  GLUCOSE 142* 138* 130*  --  134* 123*  BUN 24* 24* 23*  --  19 17  CREATININE 1.05 0.95 0.89  --  0.86 0.77  CALCIUM 8.6* 8.7* 8.4*  --  8.4* 8.2*  MG  --   --  2.1 2.1 2.0 2.1  PHOS  --   --  3.5 3.7 3.2 3.2   GFR: Estimated Creatinine Clearance: 148.8 mL/min (by C-G formula based on SCr of 0.77 mg/dL). Liver Function Tests: Recent Labs  Lab 08/02/19 0222 08/04/19 0440 08/06/19 0635 08/07/19 0300  AST 63* 67* 105* 118*  ALT 116* 80* 68* 60*  ALKPHOS 94 94 85 79  BILITOT 0.8 0.7 0.6 1.2  PROT 7.2 7.2 7.3 6.5  ALBUMIN 3.0* 2.8* 2.7* 2.6*   No results for input(s): LIPASE, AMYLASE in the last 168 hours. No results for input(s): AMMONIA in the last 168 hours. Coagulation Profile: No results for input(s): INR, PROTIME in the last 168 hours. Cardiac Enzymes: Recent Labs  Lab 08/06/19 1203 08/07/19 0300  CKTOTAL 2,594* 3,198*   BNP (last 3 results) No results for input(s): PROBNP in the last 8760 hours. HbA1C: No results for input(s): HGBA1C in the last 72 hours. CBG: Recent Labs  Lab 08/06/19 1645 08/06/19 1947 08/06/19 2343 08/07/19 0322 08/07/19 0738  GLUCAP 126* 105* 114* 120* 150*   Lipid Profile: No results for input(s): CHOL, HDL, LDLCALC, TRIG, CHOLHDL, LDLDIRECT in the last 72 hours. Thyroid Function Tests: No results for input(s): TSH, T4TOTAL, FREET4, T3FREE, THYROIDAB in the last 72 hours. Anemia Panel: No results for input(s): VITAMINB12, FOLATE, FERRITIN, TIBC, IRON, RETICCTPCT in the last 72 hours. Sepsis Labs: Recent Labs  Lab 08/01/19 0615 08/01/19 0929  LATICACIDVEN 1.2 2.0*    Recent Results (from the past 240 hour(s))  Culture, blood (routine x 2)     Status: None (Preliminary result)   Collection Time: 07/30/19  1:09 PM   Specimen: BLOOD LEFT ARM  Result Value Ref Range Status   Specimen Description BLOOD LEFT ARM  Final    Special Requests   Final    BOTTLES DRAWN AEROBIC AND ANAEROBIC Blood Culture adequate volume   Culture   Final    NO GROWTH 4 DAYS Performed at [facility], [place] [address], [place], [place] [address]    Report Status PENDING  Incomplete  Culture, blood (routine x 2)     Status: None (Preliminary result)   Collection Time: 07/30/19  1:09 PM   Specimen: BLOOD RIGHT HAND  Result Value Ref Range Status   Specimen Description BLOOD RIGHT HAND  Final   Special Requests   Final    BOTTLES DRAWN AEROBIC ONLY Blood Culture results may not be optimal due to an inadequate volume of blood received in culture bottles   Culture   Final    NO GROWTH 4 DAYS Performed at [facility], [place] [address] [place], [place] [address]    Report Status PENDING  Incomplete  Culture, blood (Routine X 2) w Reflex to ID Panel     Status: None   Collection Time: 08/01/19  4:50 AM   Specimen: BLOOD  Result Value Ref Range Status   Specimen Description BLOOD PICC LINE  Final   Special Requests   Final    BOTTLES DRAWN AEROBIC ONLY Blood Culture adequate volume   Culture   Final    NO GROWTH 5 DAYS Performed at [facility], [place] [address], [place], [place] [address]    Report Status 08/06/2019 FINAL  Final  Culture, blood (Routine X 2) w Reflex to ID Panel     Status: None   Collection Time: 08/01/19  4:50 AM   Specimen: BLOOD RIGHT HAND  Result Value Ref Range Status   Specimen Description BLOOD RIGHT HAND  Final   Special Requests   Final    BOTTLES DRAWN AEROBIC ONLY Blood Culture adequate volume   Culture   Final    NO GROWTH 5 DAYS Performed at [facility], [place] [address], [place], [place] [address]    Report Status 08/06/2019 FINAL  Final  Culture, Urine     Status: None   Collection Time: 08/04/19 12:48 PM   Specimen: Urine, Random  Result Value Ref Range Status   Specimen Description URINE, RANDOM  Final   Special Requests NONE  Final   Culture   Final    NO  GROWTH Performed at [facility], [place] [address], [place], [place] [address]    Report Status 08/05/2019 FINAL  Final  Culture, blood (routine x 2)     Status: None (Preliminary result)   Collection Time: 08/04/19  6:59 PM   Specimen: BLOOD LEFT ARM  Result Value Ref Range Status   Specimen Description BLOOD LEFT ARM  Final   Special Requests   Final    BOTTLES DRAWN AEROBIC ONLY Blood Culture results may not be optimal due to an inadequate volume of blood received in culture bottles   Culture   Final    NO GROWTH 3 DAYS Performed at [facility], [place] [address] [place], [place] [address]    Report Status PENDING  Incomplete  Culture, blood (routine x 2)     Status: None (Preliminary result)   Collection Time: 08/04/19  6:59 PM   Specimen: BLOOD LEFT HAND  Result Value Ref Range Status   Specimen Description BLOOD LEFT HAND  Final   Special Requests   Final    BOTTLES DRAWN AEROBIC ONLY Blood Culture results may not be optimal due to an inadequate volume of blood received in culture bottles   Culture   Final    NO GROWTH 3 DAYS Performed at [facility], [place] [address] [place], [place] [address]    Report Status PENDING  Incomplete  Body fluid culture     Status: None (Preliminary result)   Collection Time: 08/05/19 11:33 AM   Specimen: Body Fluid  Result Value Ref Range Status   Specimen Description FLUID ABDOMEN  Final   Special Requests NONE  Final   Gram Stain   Final    RARE WBC PRESENT,BOTH PMN AND MONONUCLEAR NO ORGANISMS SEEN    Culture   Final    NO GROWTH 2 DAYS Performed at [facility], [address], [place], [place] [address]    Report Status PENDING  Incomplete  Culture, blood (routine x 2)     Status: None (Preliminary result)   Collection Time: 08/06/19  4:30 PM   Specimen: BLOOD  Result Value Ref Range Status   Specimen Description BLOOD LEFT ANTECUBITAL  Final   Special Requests AEROBIC BOTTLE ONLY Blood Culture adequate  volume  Final   Culture   Final    NO GROWTH < 24 HOURS Performed at [facility], [place] [address] [place], [place] [address]    Report Status PENDING  Incomplete  Culture, blood (routine x 2)     Status: None (Preliminary result)   Collection Time: 08/06/19  4:42 PM   Specimen: BLOOD LEFT HAND  Result Value Ref Range Status   Specimen Description BLOOD LEFT HAND  Final   Special Requests NONE  Final   Culture   Final    NO GROWTH < 24 HOURS Performed at [facility], [place] [address], [place], [place] [address]    Report Status PENDING  Incomplete         Radiology Studies: No results found.      Scheduled Meds:  baclofen  10 mg Oral TID   busPIRone  30 mg Oral TID   chlorhexidine  15 mL Mouth Rinse BID   Chlorhexidine Gluconate Cloth  6 each Topical Daily   famotidine  20 mg Per Tube BID   feeding supplement (PRO-STAT SUGAR FREE 64)  30 mL Per Tube TID   free water  150 mL Per Tube Q8H   levETIRAcetam  500 mg Per Tube BID   mouth rinse  15 mL Mouth Rinse q12n4p   metoprolol tartrate  50 mg Per Tube Q8H   sodium chloride flush  10-40 mL Intracatheter Q12H   Continuous Infusions:  sodium chloride 10 mL/hr at 08/01/19 1715   sodium chloride 100 mL/hr at 08/07/19 0900   ceFEPime (MAXIPIME) IV Stopped (08/07/19 0724)   feeding supplement (OSMOLITE 1.5 CAL) Stopped (08/07/19 0738)   heparin 1,200 Units/hr (08/07/19 0817)   vancomycin 1,250 mg (08/07/19 0958)     LOS: 19 days    Time spent: 35 mins.More than 50% of that time was spent in counseling and/or coordination of care.      Shelly Coss, MD [facility] Pager [phone]  If 7PM-7AM, please contact night-coverage [email or web address] Password [email or web address] 08/07/2019, 11:08 AM

## 2019-08-07 NOTE — Progress Notes (Signed)
Called by bedside RN with concerns regarding abdominal swelling near recent surgical incision and ongoing fever. On assessment, pt VSS, he does not appear to be in immediate distress. Neurologically at baseline. Abdominal swelling and tenderness is noted over staples. According to RN the swelling has markedly increased since 2000 at the beginning of her shift.   Abdominal swelling - CT scan of abdomen and pelvis - HOLD heparin for now -Hold tube feeds for now - CBC ordered  Fever of unknown origin - This continues to be an ongoing issue with this patient - abscess vs. Neurogenic fever. Currently on IV abx. Continue with current treatment course. ID and Neuro following.  Lovey Newcomer, NP [facility] 7p-7a [phone]

## 2019-08-07 NOTE — Progress Notes (Addendum)
ANTICOAGULATION CONSULT NOTE  Pharmacy Consult:  Heparin Indication:  DVT/PE  Allergies  Allergen Reactions  . Hydrocodone Nausea Only    Patient Measurements: Height: 5\' 11"  (180.3 cm) Weight: 228 lb 6.3 oz (103.6 kg) IBW/kg (Calculated) : 75.3 Heparin Dosing Weight: 96 kg  Vital Signs: Temp: 102.2 F (39 C) (09/13 0734) Temp Source: Core (09/13 0734) BP: 127/86 (09/13 0626) Pulse Rate: 108 (09/13 0626)  Labs: Recent Labs    08/05/19 0457 08/06/19 0635 08/06/19 1203 08/07/19 0300 08/07/19 0520  HGB 9.4* 9.4*  --  9.7*  --   HCT 29.9* 29.4*  --  29.9*  --   PLT 222 221  --  282  --   HEPARINUNFRC 0.45 0.42  --   --  0.63  CREATININE  --  0.86  --  0.77  --   CKTOTAL  --   --  2,594* 3,198*  --     Estimated Creatinine Clearance: 148.8 mL/min (by C-G formula based on SCr of 0.77 mg/dL).   Assessment: 63 YOM presented with acute ischemic CVA and did not receive tPA.  There was an incidental finding of DVT and IVC filter was placed on 07/21/19 as patient was not a candidate for [facility] due to craniectomy.  He was cleared to start IV heparin, but then stopped on 07/30/19 due to right frontal lobe hemorrhage.  CTA on 08/01/19 showed non-occlusive thrombus and repeat Doppler on 08/02/19 positive for bilateral DVTs.  Heparin restarted on 08/02/19 given extensive DVTs and PE.    Hep lvl this am was above goal at 0.63  Overnight abdominal tenderness; CT abd shows possible hematoma Discussed with TRH MD this am and ok to resume heparin  Goal of Therapy:  Heparin level 0.3-0.5 units/ml Monitor platelets by anticoagulation protocol: Yes    Plan:   Resume heparin at lower rate 1200 units/hr Recheck hep lvl 1600 Daily Hep lvl cbc Monitor for s/sx of bleeding  Levester Fresh, PharmD, BCPS, BCCCP Clinical Pharmacist [phone]  Please check AMION for all [place] numbers  08/07/2019 7:41 AM

## 2019-08-07 NOTE — Progress Notes (Signed)
Notified Dr Kennon Holter pt with significant increase in size of right side abdominal swelling at site of bone flap. Md states will order scan blood work and come to bedside to assess pt.

## 2019-08-07 NOTE — Progress Notes (Signed)
Text and notified Dr Kennon Holter of need to place orders according to the plan of care noted in her progress note.

## 2019-08-08 ENCOUNTER — Inpatient Hospital Stay (HOSPITAL_COMMUNITY): Payer: Medicaid Other

## 2019-08-08 LAB — BODY FLUID CULTURE: Culture: NO GROWTH

## 2019-08-08 LAB — CBC
HCT: 28.4 % — ABNORMAL LOW (ref 39.0–52.0)
Hemoglobin: 9 g/dL — ABNORMAL LOW (ref 13.0–17.0)
MCH: 27.6 pg (ref 26.0–34.0)
MCHC: 31.7 g/dL (ref 30.0–36.0)
MCV: 87.1 fL (ref 80.0–100.0)
Platelets: 274 10*3/uL (ref 150–400)
RBC: 3.26 MIL/uL — ABNORMAL LOW (ref 4.22–5.81)
RDW: 13.4 % (ref 11.5–15.5)
WBC: 11.2 10*3/uL — ABNORMAL HIGH (ref 4.0–10.5)
nRBC: 0 % (ref 0.0–0.2)

## 2019-08-08 LAB — CK: Total CK: 3012 U/L — ABNORMAL HIGH (ref 49–397)

## 2019-08-08 LAB — CULTURE, BLOOD (ROUTINE X 2)
Culture: NO GROWTH
Culture: NO GROWTH
Special Requests: ADEQUATE

## 2019-08-08 LAB — HEPATITIS PANEL, ACUTE
HCV Ab: <0.1 s/co ratio (ref 0.0–0.9)
Hep A IgM: NEGATIVE
Hep B C IgM: NEGATIVE
Hepatitis B Surface Ag: NEGATIVE

## 2019-08-08 LAB — GLUCOSE, CAPILLARY
Glucose-Capillary: 120 mg/dL — ABNORMAL HIGH (ref 70–99)
Glucose-Capillary: 146 mg/dL — ABNORMAL HIGH (ref 70–99)
Glucose-Capillary: 129 mg/dL — ABNORMAL HIGH (ref 70–99)

## 2019-08-08 LAB — HEPARIN LEVEL (UNFRACTIONATED)
Heparin Unfractionated: 0.39 IU/mL (ref 0.30–0.70)
Heparin Unfractionated: 0.22 IU/mL — ABNORMAL LOW (ref 0.30–0.70)
Heparin Unfractionated: 0.37 IU/mL (ref 0.30–0.70)

## 2019-08-08 NOTE — TOC Initial Note (Addendum)
Transition of Care [facility]) - Initial/Assessment Note    Patient Details  Name: Matthew Black MRN: [id number] Date of Birth: [birth year]  Transition of Care [facility]) CM/SW Contact:    Bartholomew Crews, RN Phone Number: [phone] 08/08/2019, 2:59 PM  Clinical Narrative:                 CM following patient transition of care needs from a distance. PTA home with spouse. Independent. Working. Found down at work. Found to have ischemic stroke. S/p right hemicraniectomy with abdominal flap. Extubated 8/31. No pressors. Cortrak in place for feedings. Heparin gtt d/t multiple DVTs.   Therapy notes indicate SNF needs. Financial counselor referral for [employer] application and referral to [facility] center for disability application. Update - patient medicaid application submitted Friday 9/11 and the referral to servant center has been placed and they will be reaching out his wife.  Patient will need payor source for nursing home placement. Noted CIR following at a distance, but patient not CIR level of care at this time.   Patient not medically ready for discharge.   Expected Discharge Plan: Long Term Nursing Home Barriers to Discharge: Continued Medical Work up   Patient Goals and CMS Choice        Expected Discharge Plan and Services Expected Discharge Plan: Long Term Nursing Home In-house Referral: Financial Counselor Discharge Planning Services: CM Consult   Living arrangements for the past 2 months: Single Family Home                 DME Arranged: N/A DME Agency: NA       HH Arranged: NA HH Agency: NA        Prior Living Arrangements/Services Living arrangements for the past 2 months: Single Family Home Lives with:: Spouse, Self                   Activities of Daily Living Home Assistive Devices/Equipment: None ADL Screening (condition at time of admission) Patient's cognitive ability adequate to safely complete daily activities?: No Is the patient deaf or have difficulty  hearing?: No Does the patient have difficulty seeing, even when wearing glasses/contacts?: No Does the patient have difficulty concentrating, remembering, or making decisions?: Yes Patient able to express need for assistance with ADLs?: No Does the patient have difficulty dressing or bathing?: Yes Independently performs ADLs?: No Communication: Needs assistance Is this a change from baseline?: Change from baseline, expected to last >3 days Dressing (OT): Needs assistance Is this a change from baseline?: Change from baseline, expected to last >3 days Grooming: Needs assistance Is this a change from baseline?: Change from baseline, expected to last >3 days Feeding: Needs assistance Is this a change from baseline?: Change from baseline, expected to last >3 days Bathing: Needs assistance Is this a change from baseline?: Change from baseline, expected to last >3 days Toileting: Needs assistance Is this a change from baseline?: Change from baseline, expected to last >3days In/Out Bed: Needs assistance Is this a change from baseline?: Change from baseline, expected to last >3 days Walks in Home: Needs assistance Is this a change from baseline?: Change from baseline, expected to last >3 days Does the patient have difficulty walking or climbing stairs?: Yes  Permission Sought/Granted                  Emotional Assessment              Admission diagnosis:  Stroke (cerebrum) ([place]) [I63.9] Acute CVA (cerebrovascular  accident) [facility]) [I63.9] Patient Active Problem List   Diagnosis Date Noted  . Primary hypercoagulable state ([place])   . Acute pulmonary embolism without acute cor pulmonale (HCC)   . Deep vein thrombosis (DVT) of non-extremity vein   . Hypokalemia   . FUO (fever of unknown origin)   . Acute blood loss anemia   . SIRS (systemic inflammatory response syndrome) (HCC)   . Leukocytosis   . Endotracheal tube present   . Acute respiratory failure ([place])   . Stroke  (cerebrum) ([place]) 07/19/2019  . Pressure injury of skin 07/19/2019  . Acute CVA (cerebrovascular accident) ([place])   . Encephalopathy   . Dysphagia   . Acute encephalopathy   . Essential hypertension   . Annual physical exam 09/11/2016  . Obesity 03/12/2016  . PCP NOTES >>>>>>>>>>>>>>>>>. 03/12/2016  . Scalp abscess 12/20/2015  . Neck abscess 12/20/2015  . Pilonidal cyst 02/08/2013   PCP:  Colon Branch, MD Pharmacy:   [id number] - [place], [place] - [address] [place] [address] Phone: [phone] Fax: [phone]  [id number] - [place], [place] [id number] [place] [id number] Phone: [phone] Fax: [phone]     Social Determinants of Health (SDOH) Interventions    Readmission Risk Interventions No flowsheet data found.

## 2019-08-08 NOTE — Progress Notes (Signed)
Physical Therapy Treatment Patient Details Name: Matthew Black MRN: [id number] DOB: June 06, 1976 Today's Date: 08/08/2019    History of Present Illness Pt is a 43 y.o. M with no known PMH who presents on 8/25 after being found down at work, nonverbal with L hemiparesis. CT head showing large R hemisphere infarct with edema and mild mass effect. 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 Stable trace midline shift. S/p hemicraniectomy with abdominal flap implant. BLE DVT vasc US 8/28: acute DVT bilateral lower extremities, s/p IVC placement.     PT Comments    Pt tolerated sitting EOB for >20 mins today attempting to work on command following, arousal, cervical ROM (significant R gaze preference).  VSS throughout session on RA.  PT goals re-assessed and pt remains appropriate for SNF level rehab at discharge.  PT will continue to follow acutely for safe mobility progression   Follow Up Recommendations  SNF     Equipment Recommendations  Wheelchair (measurements PT);Wheelchair cushion (measurements PT);Hospital bed;Other (comment)(hoyer lift)    Recommendations for Other Services   NA     Precautions / Restrictions Precautions Precautions: Fall;Other (comment) Precaution Comments: L hemiparesis, R skull missing, abd bone flap Other Brace: helmet (not wearing currently due to swelling) Restrictions Weight Bearing Restrictions: No    Mobility  Bed Mobility Overal bed mobility: Needs Assistance Bed Mobility: Rolling;Sit to Supine;Supine to Sit Rolling: Max assist;Total assist;+2 for physical assistance   Supine to sit: Total assist;HOB elevated;+2 for physical assistance Sit to supine: +2 for physical assistance;Total assist;HOB elevated   General bed mobility comments: Max assist to roll to the left as pt is pulling with right arm, total to the right, total assist to come to sitting EOB from maximally elevated bed to support legs and trunk to  EOB.        Modified Rankin (Stroke Patients Only) Modified Rankin (Stroke Patients Only) Pre-Morbid Rankin Score: No symptoms Modified Rankin: Severe disability     Balance Overall balance assessment: Needs assistance Sitting-balance support: Feet supported;Bilateral upper extremity supported Sitting balance-Leahy Scale: Zero Sitting balance - Comments: total assist EOB with posterior left lateral lean.  manual assist to prop bil UEs.  Sat EOB working on arousal, cervical ROM, tracking, attention, following commands and an attempt at face washing ADL with hand over hand assist.  Postural control: Posterior lean;Left lateral lean                                  Cognition Arousal/Alertness: Awake/alert;Lethargic Behavior During Therapy: Flat affect Overall Cognitive Status: Impaired/Different from baseline Area of Impairment: Attention;Following commands;Safety/judgement;Awareness;Problem solving                   Current Attention Level: Focused   Following Commands: Follows one step commands inconsistently;Follows one step commands with increased time Safety/Judgement: Decreased awareness of safety;Decreased awareness of deficits Awareness: Intellectual Problem Solving: Slow processing;Decreased initiation;Difficulty sequencing;Requires verbal cues;Requires tactile cues General Comments: Pt not following commands consistently, ~25% with increased processing time and multimodal cues (verbal, visual, manual, hand over hand).          General Comments General comments (skin integrity, edema, etc.): BP soft, but stable, HR in the 110s and O2 sats, when accurate, were in the 90s on RA during mobility.       Pertinent Vitals/Pain Pain Assessment: Faces Faces Pain Scale: Hurts little more Pain Location:  with ROM of head/neck Pain Descriptors / Indicators: Grimacing Pain Intervention(s): Limited activity within patient's tolerance;Monitored during  session;Repositioned           PT Goals (current goals can now be found in the care plan section) Acute Rehab PT Goals Patient Stated Goal: unable PT Goal Formulation: Patient unable to participate in goal setting Time For Goal Achievement: 08/22/19 Potential to Achieve Goals: Fair Progress towards PT goals: Progressing toward goals    Frequency    Min 3X/week      PT Plan Current plan remains appropriate    Co-evaluation PT/OT/SLP Co-Evaluation/Treatment: Yes Reason for Co-Treatment: Complexity of the patient's impairments (multi-system involvement);Necessary to address cognition/behavior during functional activity;For patient/therapist safety;To address functional/ADL transfers PT goals addressed during session: Mobility/safety with mobility;Balance;Strengthening/ROM        AM-PAC PT "6 Clicks" Mobility   Outcome Measure  Help needed turning from your back to your side while in a flat bed without using bedrails?: Total Help needed moving from lying on your back to sitting on the side of a flat bed without using bedrails?: Total Help needed moving to and from a bed to a chair (including a wheelchair)?: Total Help needed standing up from a chair using your arms (e.g., wheelchair or bedside chair)?: Total Help needed to walk in hospital room?: Total Help needed climbing 3-5 steps with a railing? : Total 6 Click Score: 6    End of Session   Activity Tolerance: Patient limited by fatigue;Patient limited by lethargy Patient left: in bed;with call bell/phone within reach;with bed alarm set Nurse Communication: Mobility status PT Visit Diagnosis: Hemiplegia and hemiparesis;Other symptoms and signs involving the nervous system (R29.898);Other abnormalities of gait and mobility (R26.89) Hemiplegia - Right/Left: Left Hemiplegia - dominant/non-dominant: Non-dominant Hemiplegia - caused by: Cerebral infarction     Time: 1201-1240 PT Time Calculation (min) (ACUTE ONLY): 39  min  Charges:  $Therapeutic Activity: 8-22 mins                    Matthew Black, PT, DPT  Acute Rehabilitation [phone] pager [phone] office  @ Matthew Black: [phone]   08/08/2019, 12:53 PM

## 2019-08-08 NOTE — Progress Notes (Signed)
PROGRESS NOTE    Matthew Black  [id number] DOB: 1976-09-07 DOA: 07/19/2019 PCP: Colon Branch, MD   Brief Narrative:  Matthew Likes Tuckeris an 43 y.o.malewithout significant known PMH who presented on 8/25 with AMS as well as L hemiparesis and aphasia. UDS + for THC. CT with large R hemispheric infarct with confluent cytotoxic edema in R ACA and MCA territories and mild mass effect. 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 He was admitted to [facility] with neurology consultation and started on 3% saline. Neurosurgery was consulted and Dr. Kathyrn Sheriff recommended early decompressive hemicraniectomy, which was performed that night. He subsequently developed hemorrhagic conversion with hematoma on 9/5; helmet was removed to further release intracranial pressure and heparin was discontinued. LE doppler also indicated DVT in R peroneal, L popliteal, L posterior tibial, and L peroneal veins; IVC filter was placed.  Patient had seizure-like activity, for which he is on Keppra. He was subsequently found to have possible RLL infiltrate and with concern for aspiration he was made NPO and started on Unasyn. CTA was performed and it showed a non-occlusive LLL thrombus on 9/7.Subsequent repeat DVT US was performed 9/8 and showed acute R DVT of the peroneal veins as well as acute L DVT involving common femoral vein, L femoral vein, L proximal profunda vein, L popliteal vein, L posterior tibial veins, and L peroneal veins - and it extends up into the left iliac vein and IVC".  Heparin resumed on 9/8 and no adverse effect so far.  Hematology consulted as well. Patient spiked fever to 102.1 on 9/9 while on Unasyn.  Had some leukocytosis which seems to have improved.  Also mild tachycardia.  Recent blood culture 9/7- so far.  No clear source of infection.  CT abdomen and pelvis, CXR, UA, urine and blood cultures ordered.  UA and CXR not impressive.  CT abdomen with large fluid collection in  lower right anterior abdominal which could be hematoma, seroma or an abscess.  Neurosurgery consulted and tapped the abdominal hematoma.  Cultures negative so far.  Infectious disease consulted as well.   Assessment & Plan:   Active Problems:   Stroke (cerebrum) (HCC)   Pressure injury of skin   Acute respiratory failure (HCC)   Endotracheal tube present   Deep vein thrombosis (DVT) of non-extremity vein   Hypokalemia   FUO (fever of unknown origin)   Acute blood loss anemia   SIRS (systemic inflammatory response syndrome) (HCC)   Leukocytosis   Primary hypercoagulable state ([place])   Acute pulmonary embolism without acute cor pulmonale (HCC)   Right MCA/ACA infarct with left hemiparesis: Patient is still encephalopathic.  Acute metabolic encephalopathy due to massive CVA.  Has right ICA, MCA and ACA occlusions.  Status post hemicraniectomy with abdominal flap implant.  Hemorrhagic conversion of CVA when he started on heparin for DVT.  Heparin has been resumed.  Neurology/neurosurgery managing.  Bilateral lower extremity DVT/nonocclusive left lower lobe PE: Currently on heparin.  Also has IVC filter.  Stable abdominal wall hematoma under the abdominal flap incision site.  Has edema of the left lower extremity.  Seizure-like activity: On Keppra as per neurology.  SIRS/fever/tachycardia/tachypnea/leukocytosis: Tmax of 99.6 today.  Extensive work-up did not reveal any clear source of infection.  Suspected to be  associated  with hemorrhagic CVA.  ID following.  All cultures negative so far.  Currently on broad-spectrum antibiotics with vancomycin ,cefepime  since 9/10.  On Tylenol, baclofen for severe chills.Also started on  bromocriptine  Abdominal hematoma: CT abdomen shpwed large fluid collection in lower right anterior abdominal which could be hematoma, seroma or an abscess.  Neurosurgery consulted and tapped the abdominal hematoma.  Cultures negative so far.  Elevated CK/Elevated liver  enzymes:Found on the floor.  From rhabdomyolysis.  Also has mild elevated liver enzymes.  Hepatitis panel pending.  Continue current IV fluids.  Normocytic anemia: Currently H&H stable.  Dysphagia: Encephalopathy due to massive CVA.  On feeding tube.  Nutrition Problem: Inadequate oral intake Etiology: acute illness      DVT prophylaxis: Heparin IV Code Status: Full Family Communication: None present at the bedside Disposition Plan: Undetermined at this point   Procedures: Hemicraniectomy  Antimicrobials:  Anti-infectives (From admission, onward)   Start     Dose/Rate Route Frequency Ordered Stop   08/05/19 1000  vancomycin (VANCOCIN) 1,250 mg in sodium chloride 0.9 % 250 mL IVPB  Status:  Discontinued     1,250 mg 166.7 mL/hr over 90 Minutes Intravenous Every 12 hours 08/04/19 1856 08/08/19 0914   08/05/19 0600  ceFEPIme (MAXIPIME) 2 g in sodium chloride 0.9 % 100 mL IVPB  Status:  Discontinued     2 g 200 mL/hr over 30 Minutes Intravenous Every 8 hours 08/04/19 1856 08/08/19 0914   08/04/19 2200  metroNIDAZOLE (FLAGYL) IVPB 500 mg  Status:  Discontinued     500 mg 100 mL/hr over 60 Minutes Intravenous Every 8 hours 08/04/19 1743 08/06/19 1548   08/04/19 1830  vancomycin (VANCOCIN) 2,000 mg in sodium chloride 0.9 % 500 mL IVPB     2,000 mg 250 mL/hr over 120 Minutes Intravenous  Once 08/04/19 1805 08/04/19 2053   08/04/19 1815  ceFEPIme (MAXIPIME) 2 g in sodium chloride 0.9 % 100 mL IVPB     2 g 200 mL/hr over 30 Minutes Intravenous  Once 08/04/19 1805 08/04/19 1842   07/31/19 2200  Ampicillin-Sulbactam (UNASYN) 3 g in sodium chloride 0.9 % 100 mL IVPB  Status:  Discontinued     3 g 200 mL/hr over 30 Minutes Intravenous Every 6 hours 07/31/19 1401 08/04/19 1743   07/31/19 1415  Ampicillin-Sulbactam (UNASYN) 3 g in sodium chloride 0.9 % 100 mL IVPB     3 g 200 mL/hr over 30 Minutes Intravenous STAT 07/31/19 1401 07/31/19 1551   07/19/19 2315  bacitracin 50,000 Units in  sodium chloride 0.9 % 500 mL irrigation  Status:  Discontinued       As needed 07/19/19 2315 07/19/19 2339      Subjective:  Patient seen and examined the bedside this morning.  Currently hemodynamically stable.  Persistently encephalopathic, does not follow commands.  Objective: Vitals:   08/08/19 0730 08/08/19 0800 08/08/19 0900 08/08/19 1000  BP:  112/87 127/77 124/78  Pulse:  93 94 (!) 101  Resp:  (!) 24 (!) 22 (!) 26  Temp: 99.6 F (37.6 C)     TempSrc: Axillary     SpO2:  100% 100% 100%  Weight:      Height:        Intake/Output Summary (Last 24 hours) at 08/08/2019 1109 Last data filed at 08/08/2019 0800 Gross per 24 hour  Intake 3513.55 ml  Output 2550 ml  Net 963.55 ml   Filed Weights   08/04/19 0500 08/05/19 0455 08/06/19 0500  Weight: 100.7 kg 105.3 kg 103.6 kg    Examination:  General exam: Encephalopathic, unresponsive  HEENT: Eyes open, does not follow, feeding tube, staples on the scalp Respiratory system: Bilateral  equal air entry, normal vesicular breath sounds, no wheezes or crackles  Cardiovascular system: S1 & S2 heard, RRR. No JVD, murmurs, rubs, gallops or clicks.. Gastrointestinal system: Abdomen is mildly distended, soft and nontender. No organomegaly or masses felt. Normal bowel sounds heard.  Surgical wound on the right lower quadrant with the staples.Distended area of abdomen underlying the surgical wound. Central nervous system: Not Alert or oriented.  Extremities: Edema of the LLE, no clubbing ,no cyanosis, distal peripheral pulses palpable. Skin: No rashes, lesions or ulcers,no icterus ,no pallor    Data Reviewed: I have personally reviewed following labs and imaging studies  CBC: Recent Labs  Lab 08/04/19 0440 08/05/19 0457 08/06/19 0635 08/07/19 0300 08/08/19 0530  WBC 13.6* 12.7* 13.1* 15.1* 11.2*  HGB 10.3* 9.4* 9.4* 9.7* 9.0*  HCT 32.0* 29.9* 29.4* 29.9* 28.4*  MCV 88.6 87.4 88.0 86.7 87.1  PLT 204 222 221 [id number]    Basic Metabolic Panel: Recent Labs  Lab 08/02/19 0222 08/03/19 0512 08/04/19 0440 08/05/19 0457 08/06/19 0635 08/07/19 0300  NA 137 136 135  --  136 136  K 4.5 4.2 3.9  --  3.6 3.6  CL 105 104 103  --  103 106  CO2 24 22 22   --  23 24  GLUCOSE 142* 138* 130*  --  134* 123*  BUN 24* 24* 23*  --  19 17  CREATININE 1.05 0.95 0.89  --  0.86 0.77  CALCIUM 8.6* 8.7* 8.4*  --  8.4* 8.2*  MG  --   --  2.1 2.1 2.0 2.1  PHOS  --   --  3.5 3.7 3.2 3.2   GFR: Estimated Creatinine Clearance: 148.8 mL/min (by C-G formula based on SCr of 0.77 mg/dL). Liver Function Tests: Recent Labs  Lab 08/02/19 0222 08/04/19 0440 08/06/19 0635 08/07/19 0300  AST 63* 67* 105* 118*  ALT 116* 80* 68* 60*  ALKPHOS 94 94 85 79  BILITOT 0.8 0.7 0.6 1.2  PROT 7.2 7.2 7.3 6.5  ALBUMIN 3.0* 2.8* 2.7* 2.6*   No results for input(s): LIPASE, AMYLASE in the last 168 hours. No results for input(s): AMMONIA in the last 168 hours. Coagulation Profile: No results for input(s): INR, PROTIME in the last 168 hours. Cardiac Enzymes: Recent Labs  Lab 08/06/19 1203 08/07/19 0300 08/08/19 0857  CKTOTAL 2,594* 3,198* 3,012*   BNP (last 3 results) No results for input(s): PROBNP in the last 8760 hours. HbA1C: No results for input(s): HGBA1C in the last 72 hours. CBG: Recent Labs  Lab 08/07/19 1604 08/07/19 1957 08/07/19 2323 08/08/19 0342 08/08/19 0728  GLUCAP 124* 116* 113* 120* 146*   Lipid Profile: No results for input(s): CHOL, HDL, LDLCALC, TRIG, CHOLHDL, LDLDIRECT in the last 72 hours. Thyroid Function Tests: No results for input(s): TSH, T4TOTAL, FREET4, T3FREE, THYROIDAB in the last 72 hours. Anemia Panel: No results for input(s): VITAMINB12, FOLATE, FERRITIN, TIBC, IRON, RETICCTPCT in the last 72 hours. Sepsis Labs: No results for input(s): PROCALCITON, LATICACIDVEN in the last 168 hours.  Recent Results (from the past 240 hour(s))  Culture, blood (routine x 2)     Status: None  (Preliminary result)   Collection Time: 07/30/19  1:09 PM   Specimen: BLOOD LEFT ARM  Result Value Ref Range Status   Specimen Description BLOOD LEFT ARM  Final   Special Requests   Final    BOTTLES DRAWN AEROBIC AND ANAEROBIC Blood Culture adequate volume   Culture   Final    NO  GROWTH 4 DAYS Performed at [facility], [place] [address] [place], [place] [address]    Report Status PENDING  Incomplete  Culture, blood (routine x 2)     Status: None (Preliminary result)   Collection Time: 07/30/19  1:09 PM   Specimen: BLOOD RIGHT HAND  Result Value Ref Range Status   Specimen Description BLOOD RIGHT HAND  Final   Special Requests   Final    BOTTLES DRAWN AEROBIC ONLY Blood Culture results may not be optimal due to an inadequate volume of blood received in culture bottles   Culture   Final    NO GROWTH 4 DAYS Performed at [facility], [place] [address], [place], [place] [address]    Report Status PENDING  Incomplete  Culture, blood (Routine X 2) w Reflex to ID Panel     Status: None   Collection Time: 08/01/19  4:50 AM   Specimen: BLOOD  Result Value Ref Range Status   Specimen Description BLOOD PICC LINE  Final   Special Requests   Final    BOTTLES DRAWN AEROBIC ONLY Blood Culture adequate volume   Culture   Final    NO GROWTH 5 DAYS Performed at [facility], [place] [address], [place], [place] [address]    Report Status 08/06/2019 FINAL  Final  Culture, blood (Routine X 2) w Reflex to ID Panel     Status: None   Collection Time: 08/01/19  4:50 AM   Specimen: BLOOD RIGHT HAND  Result Value Ref Range Status   Specimen Description BLOOD RIGHT HAND  Final   Special Requests   Final    BOTTLES DRAWN AEROBIC ONLY Blood Culture adequate volume   Culture   Final    NO GROWTH 5 DAYS Performed at [facility], [place] [address], [place], [place] [address]    Report Status 08/06/2019 FINAL  Final  Culture, Urine     Status: None   Collection Time: 08/04/19 12:48 PM     Specimen: Urine, Random  Result Value Ref Range Status   Specimen Description URINE, RANDOM  Final   Special Requests NONE  Final   Culture   Final    NO GROWTH Performed at [facility], [place] [address] [place], [place] [address]    Report Status 08/05/2019 FINAL  Final  Culture, blood (routine x 2)     Status: None (Preliminary result)   Collection Time: 08/04/19  6:59 PM   Specimen: BLOOD LEFT ARM  Result Value Ref Range Status   Specimen Description BLOOD LEFT ARM  Final   Special Requests   Final    BOTTLES DRAWN AEROBIC ONLY Blood Culture results may not be optimal due to an inadequate volume of blood received in culture bottles   Culture   Final    NO GROWTH 3 DAYS Performed at [facility], [place] [address], [place], [place] [address]    Report Status PENDING  Incomplete  Culture, blood (routine x 2)     Status: None (Preliminary result)   Collection Time: 08/04/19  6:59 PM   Specimen: BLOOD LEFT HAND  Result Value Ref Range Status   Specimen Description BLOOD LEFT HAND  Final   Special Requests   Final    BOTTLES DRAWN AEROBIC ONLY Blood Culture results may not be optimal due to an inadequate volume of blood received in culture bottles   Culture   Final    NO  GROWTH 3 DAYS Performed at [facility], [place] [address], [place], [place] [address]    Report Status PENDING  Incomplete  Body fluid culture     Status: None (Preliminary result)   Collection Time: 08/05/19 11:33 AM   Specimen: Body Fluid  Result Value Ref Range Status   Specimen Description FLUID ABDOMEN  Final   Special Requests NONE  Final   Gram Stain   Final    RARE WBC PRESENT,BOTH PMN AND MONONUCLEAR NO ORGANISMS SEEN    Culture   Final    NO GROWTH 3 DAYS Performed at [facility], [address] [place], [place] [address]    Report Status PENDING  Incomplete  Culture, blood (routine x 2)     Status: None (Preliminary result)   Collection Time: 08/06/19  4:30 PM    Specimen: BLOOD  Result Value Ref Range Status   Specimen Description BLOOD LEFT ANTECUBITAL  Final   Special Requests AEROBIC BOTTLE ONLY Blood Culture adequate volume  Final   Culture   Final    NO GROWTH 1 DAY Performed at [facility], [place] [address], [place], [place] [address]    Report Status PENDING  Incomplete  Culture, blood (routine x 2)     Status: None (Preliminary result)   Collection Time: 08/06/19  4:42 PM   Specimen: BLOOD LEFT HAND  Result Value Ref Range Status   Specimen Description BLOOD LEFT HAND  Final   Special Requests NONE  Final   Culture   Final    NO GROWTH 1 DAY Performed at [facility], [place] [address] [place],  [address]    Report Status PENDING  Incomplete         Radiology Studies: No results found.      Scheduled Meds:  baclofen  10 mg Oral TID   bromocriptine  2.5 mg Oral BID   busPIRone  30 mg Oral TID   chlorhexidine  15 mL Mouth Rinse BID   Chlorhexidine Gluconate Cloth  6 each Topical Daily   famotidine  20 mg Per Tube BID   feeding supplement (PRO-STAT SUGAR FREE 64)  30 mL Per Tube TID   free water  150 mL Per Tube Q8H   levETIRAcetam  500 mg Per Tube BID   mouth rinse  15 mL Mouth Rinse q12n4p   metoprolol tartrate  50 mg Per Tube Q8H   sodium chloride flush  10-40 mL Intracatheter Q12H   Continuous Infusions:  sodium chloride 10 mL/hr at 08/01/19 1715   sodium chloride 100 mL/hr at 08/08/19 0800   feeding supplement (OSMOLITE 1.5 CAL) 60 mL/hr at 08/08/19 0830   heparin 1,100 Units/hr (08/07/19 1920)     LOS: 20 days    Time spent: 35 mins.More than 50% of that time was spent in counseling and/or coordination of care.      Shelly Coss, MD [facility] Pager [phone]  If 7PM-7AM, please contact night-coverage [email or web address] Password [facility] 08/08/2019, 11:09 AM

## 2019-08-08 NOTE — Progress Notes (Signed)
Occupational Therapy Treatment Patient Details Name: Matthew Black MRN: [id number] DOB: 1976-04-16 Today's Date: 08/08/2019    History of present illness Pt is a 43 y.o. M with no known PMH who presents on 8/25 after being found down at work, nonverbal with L hemiparesis. CT head showing large R hemisphere infarct with edema and mild mass effect. 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 Stable trace midline shift. S/p hemicraniectomy with abdominal flap implant. BLE DVT vasc US 8/28: acute DVT bilateral lower extremities, s/p IVC placement.    OT comments  Pt progressing slowly toward stated goals, focused session on BADL engagement with EOB mobility for increased arousal and attention. Pt with R sided gaze preference, needing physical assist to move head to track items. Pt still not consistent with tracking stimuli. He is overall total A +2 for bed mobility to sit EOB, sat for about 20 minutes to focus on alertness level and washing face. Pt is max A HOHA for face washing at this time. Passive PNF patterns to LUE, attempted to integrate RUE in active movement but movement still not purposeful at this time. Positioned neck in midline at end of session to also facilitate lateral flexion stretch to L. D/c recs remain appropriate. Will continue to follow.   Follow Up Recommendations  SNF;Supervision/Assistance - 24 hour    Equipment Recommendations  Other (comment)(defer)    Recommendations for Other Services      Precautions / Restrictions Precautions Precautions: Fall;Other (comment) Precaution Comments: L hemiparesis, R skull missing, abd bone flap Other Brace: helmet (not currently wearing 2/2 swelling) Restrictions Weight Bearing Restrictions: No       Mobility Bed Mobility Overal bed mobility: Needs Assistance Bed Mobility: Rolling;Sit to Supine;Supine to Sit Rolling: Max assist;Total assist;+2 for physical assistance   Supine to sit:  Total assist;HOB elevated;+2 for physical assistance Sit to supine: +2 for physical assistance;Total assist;HOB elevated   General bed mobility comments: max A to roll to L, pt pulling with R arm/total to R. Total A for bed mobility for total support of BLEs/BUEs  Transfers                      Balance Overall balance assessment: Needs assistance Sitting-balance support: Feet supported;Bilateral upper extremity supported Sitting balance-Leahy Scale: Zero Sitting balance - Comments: total A to sit EOB with posterior left lateral lean Postural control: Posterior lean;Left lateral lean                                 ADL either performed or assessed with clinical judgement   ADL Overall ADL's : Needs assistance/impaired     Grooming: Wash/dry face;Sitting;Maximal assistance Grooming Details (indicate cue type and reason): total A for sitting balance and holding up head, HOHA to bring wash cloth to face, pt using hand. Poor arousal level                             Functional mobility during ADLs: Total assistance;+2 for physical assistance;+2 for safety/equipment General ADL Comments: total A for all other BADLs     Vision   Additional Comments: R sided gaze preference, difficulty scanning without physical assist to move head- will continue assess as arousal improves   Perception     Praxis      Cognition Arousal/Alertness: Awake/alert;Lethargic Behavior During  Therapy: Flat affect Overall Cognitive Status: Impaired/Different from baseline Area of Impairment: Attention;Following commands;Safety/judgement;Awareness;Problem solving               Rancho Levels of Cognitive Functioning [place]igos Scales of Cognitive Functioning: Generalized response   Current Attention Level: Focused   Following Commands: Follows one step commands inconsistently;Follows one step commands with increased time Safety/Judgement: Decreased awareness  of safety;Decreased awareness of deficits Awareness: Intellectual Problem Solving: Slow processing;Decreased initiation;Difficulty sequencing;Requires verbal cues;Requires tactile cues General Comments: not consistently following commands, decreased processing time with multimodal cues needed. Level of arousal impacting performance        Exercises     Shoulder Instructions       General Comments BP soft, but stable, HR in the 110s and O2 sats, when accurate, were in the 90s on RA during mobility.     Pertinent Vitals/ Pain       Pain Assessment: Faces Faces Pain Scale: Hurts little more Pain Location: with ROM of head/neck Pain Descriptors / Indicators: Grimacing Pain Intervention(s): Limited activity within patient's tolerance;Monitored during session;Repositioned  Home Living                                          Prior Functioning/Environment              Frequency  Min 2X/week        Progress Toward Goals  OT Goals(current goals can now be found in the care plan section)  Progress towards OT goals: Progressing toward goals  Acute Rehab OT Goals Patient Stated Goal: unable OT Goal Formulation: Patient unable to participate in goal setting Time For Goal Achievement: 08/07/19 Potential to Achieve Goals: [place] Discharge plan needs to be updated;Frequency remains appropriate    Co-evaluation    PT/OT/SLP Co-Evaluation/Treatment: Yes Reason for Co-Treatment: Complexity of the patient's impairments (multi-system involvement);Necessary to address cognition/behavior during functional activity;For patient/therapist safety;To address functional/ADL transfers PT goals addressed during session: Mobility/safety with mobility;Balance;Proper use of DME OT goals addressed during session: ADL's and self-care;Strengthening/ROM      AM-PAC OT "6 Clicks" Daily Activity     Outcome Measure   Help from another person eating meals?: Total(NG) Help  from another person taking care of personal grooming?: Total Help from another person toileting, which includes using toliet, bedpan, or urinal?: Total Help from another person bathing (including washing, rinsing, drying)?: Total Help from another person to put on and taking off regular upper body clothing?: Total Help from another person to put on and taking off regular lower body clothing?: Total 6 Click Score: 6    End of Session    OT Visit Diagnosis: Unsteadiness on feet (R26.81);Muscle weakness (generalized) (M62.81);Hemiplegia and hemiparesis Hemiplegia - Right/Left: Left Hemiplegia - dominant/non-dominant: Non-Dominant Hemiplegia - caused by: Nontraumatic intracerebral hemorrhage   Activity Tolerance Patient tolerated treatment well   Patient Left in bed;with call bell/phone within reach;with bed alarm set   Nurse Communication Mobility status        Time: [id number] OT Time Calculation (min): 39 min  Charges: OT General Charges $OT Visit: 1 Visit OT Treatments $Self Care/Home Management : 8-22 mins   Zenovia Jarred, MSOT, OTR/L [facility] Office: [phone]  Zenovia Jarred 08/08/2019, 2:08 PM

## 2019-08-08 NOTE — Progress Notes (Addendum)
ANTICOAGULATION CONSULT NOTE  Pharmacy Consult:  Heparin Indication:  DVT/PE  Allergies  Allergen Reactions  . Hydrocodone Nausea Only    Patient Measurements: Height: 5\' 11"  (180.3 cm) Weight: 228 lb 6.3 oz (103.6 kg) IBW/kg (Calculated) : 75.3 Heparin Dosing Weight: 96 kg  Vital Signs: Temp: 99.6 F (37.6 C) (09/14 0730) Temp Source: Axillary (09/14 0730) BP: 112/87 (09/14 0800) Pulse Rate: 93 (09/14 0800)  Labs: Recent Labs    08/06/19 0635 08/06/19 1203 08/07/19 0300 08/07/19 0520 08/07/19 1608 08/08/19 0530  HGB 9.4*  --  9.7*  --   --  9.0*  HCT 29.4*  --  29.9*  --   --  28.4*  PLT 221  --  282  --   --  274  HEPARINUNFRC 0.42  --   --  0.63 0.58 0.39  CREATININE 0.86  --  0.77  --   --   --   CKTOTAL  --  2,594* 3,198*  --   --   --     Estimated Creatinine Clearance: 148.8 mL/min (by C-G formula based on SCr of 0.77 mg/dL).   Assessment: 77 YOM presented with acute ischemic CVA and did not receive tPA.  There was an incidental finding of DVT and IVC filter was placed on 07/21/19 as patient was not a candidate for [facility] due to craniectomy.  He was cleared to start IV heparin, but then stopped on 07/30/19 due to right frontal lobe hemorrhage.  CTA on 08/01/19 showed non-occlusive thrombus and repeat Doppler on 08/02/19 positive for bilateral DVTs.  Heparin restarted on 08/02/19 given extensive DVTs and PE.  CT abd shows stable wall hematoma under the abdominal flap incisions site - continuing heparin at this time.  Heparin level this morning is therapeutic after a rate decrease earlier today (HL 0.39 << 0.58, goal of 0.3-0.5). Hgb/Hct slight drop, plts wnl.   Goal of Therapy:  Heparin level 0.3-0.5 units/ml Monitor platelets by anticoagulation protocol: Yes    Plan:   - Continue Heparin at 1100 units/hr (11 ml/hr) - Will continue to monitor for any signs/symptoms of bleeding and will follow up with heparin level in 6 hours to confirm  Thank you for allowing pharmacy  to be a part of this patient's care.  Alycia Rossetti, PharmD, BCPS Clinical Pharmacist Clinical phone for 08/08/2019: [phone] 08/08/2019 8:18 AM   **Pharmacist phone directory can now be found on [email or web address] ([email or web address]).  Listed under [place].  -------------------------------------------------------------------------------------------------------------- Addendum:   A confirmatory heparin level this morning resulted as slightly SUBtherapeutic (HL 0.22 << 0.39, goal of 0.3-0.5). Will increase slightly and recheck this evening.   Plan - Increase Heparin drip rate slightly to 1150 units/hr (11.5 ml/hr) - Will continue to monitor for any signs/symptoms of bleeding and will follow up with heparin level in 6 hours   Thank you for allowing pharmacy to be a part of this patient's care.  Alycia Rossetti, PharmD, BCPS Clinical Pharmacist Clinical phone for 08/08/2019: [phone] 08/08/2019 1:10 PM   **Pharmacist phone directory can now be found on [email or web address] ([email or web address]).  Listed under [place].

## 2019-08-08 NOTE — Progress Notes (Signed)
SLP Cancellation Note  Patient Details Name: Matthew Black MRN: [id number] DOB: 07-Sep-1976   Cancelled treatment:       Reason Eval/Treat Not Completed: Fatigue/lethargy limiting ability to participate; Pt planned for MBSS at 14:00 this date. Per radiology and RN, pt not able to arouse for exam this afternoon. Will follow up for readiness for objective swallow study   Ragina Fenter E Chrisie Jankovich MA, [place]  08/08/2019, 3:25 PM

## 2019-08-08 NOTE — Progress Notes (Signed)
STROKE TEAM PROGRESS NOTE   INTERVAL HISTORY Pt now has been afebrile today without any chills.  It is unclear if this is a result of antibiotics or bromocriptine which was started yesterday. Marland Kitchen His wife is present at the bedside during rounds.  WBC count is also down today  Vitals:   08/08/19 1300 08/08/19 1314 08/08/19 1400 08/08/19 1500  BP: 118/73 118/73 109/70 111/70  Pulse: (!) 101 (!) 111 95 (!) 101  Resp: (!) 22  (!) 24 (!) 22  Temp:      TempSrc:      SpO2: 100%  100% 100%  Weight:      Height:        CBC:  Recent Labs  Lab 08/07/19 0300 08/08/19 0530  WBC 15.1* 11.2*  HGB 9.7* 9.0*  HCT 29.9* 28.4*  MCV 86.7 87.1  PLT 282 [id number]    Basic Metabolic Panel:  Recent Labs  Lab 08/06/19 0635 08/07/19 0300  NA 136 136  K 3.6 3.6  CL 103 106  CO2 23 24  GLUCOSE 134* 123*  BUN 19 17  CREATININE 0.86 0.77  CALCIUM 8.4* 8.2*  MG 2.0 2.1  PHOS 3.2 3.2    IMAGING Ct Head Wo Contrast Ct Cervical Spine Wo Contrast  07/19/2019 1132 1. 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 2. No associated hemorrhage and mild intracranial mass effect at this time, including trace leftward midline shift. 3. Evidence of large vessel occlusion: Hyperdensity of the right ICA terminus, the right A1 and MCA. 4. Unaffected brain parenchyma appears negative. 5.  No acute traumatic injury identified in the cervical spine.   Mr Brain 75 Contrast Mr Angio Head Wo Contrast Mr Angio Neck Wo Contrast 07/19/2019 1330 1. 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 The right ICA terminus, right ACA and MCA also appear occluded. 2. 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 Cytotoxic edema but no convincing hemorrhage. Stable mild mass effect with trace leftward midline shift. 3. Intracranial MRA is degraded by motion artifact, but no other large vessel occlusion is suspected. There appears to be lobes reperfusion of the  right PCA. 4. The left hemisphere and posterior fossa brain parenchyma appears normal.   Ct Head Wo Contrast 07/19/2019 1532 Similar appearance to earlier. Low-density and swelling of the right hemisphere in the ACA and MCA territories with mass effect and right-to-left shift of 5-6 mm. No hemorrhagic transformation at this time.   Ct Head Wo Contrast 07/20/2019 0444 1. Acute right ACA and MCA territory infarct with progressive swelling that has decompressed through the craniectomy defect. 2. No acute hemorrhage or new infarction.   Ct Angio Head W Or Wo Contrast Ct Angio Neck W Or Wo Contrast 07/21/2019 1. Stable from prior MRA. 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 No evidence of atherosclerosis or vasculopathy in the other vessels. 2. Cytotoxic edema causes 5 mm of midline shift and brain bulging through the craniectomy defect. Mild petechial hemorrhage is seen at the basal ganglia.   Ct Head Wo Contrast 07/23/2019 1. Unchanged appearance of massive right MCA and ACA territory infarcts with parenchyma extending through decompressive craniectomy. 2. Small focus of suspected hemorrhage adjacent to the right caudate head.  Ct Head Wo Contrast 07/30/2019 1. Decreasing mass effect within large right anterior MCA and ACA territory infarct. 2. Midline shift is no longer present. There  is decreased effacement of the right lateral ventricle. 3. Infarcted brain tissue still herniates through the craniectomy site. 4. New parenchymal hemorrhage within the infarcted tissue anteriorly measures 2.5 x 2.3 x 2.7 cm. 5. No new infarct.  Ct Head Wo Contrast 08/01/2019  1. Stable head CT since 07/30/2019. 2. Again noted is a large infarct involving the right MCA and right ACA territories with brain tissue herniating through the right craniotomy defect. 3. Parenchymal hemorrhage in the right anterior cortex has minimally changed. Stable petechial hemorrhage  in the right basal ganglia region.   Dg Chest Port 1 View 08/04/2019 Well-positioned support structures. No active cardiopulmonary disease.  08/01/2019 No acute disease   Ct Angio Chest Pe W Or Wo Contrast 08/01/2019 Nonocclusive thrombus seen within the left posterior lower lobe segmental artery.    2D Echocardiogram 07/20/2019  1. The left ventricle has normal systolic function with an ejection fraction of 60-65%. The cavity size was normal. Left ventricular diastolic parameters were normal.  2. The right ventricle has normal systolic function. The cavity was normal. There is no increase in right ventricular wall thickness.  3. The pericardial effusion is circumferential.  4. Trivial pericardial effusion is present.  5. The mitral valve is grossly normal.  6. The tricuspid valve is grossly normal.  7. The aortic valve is tricuspid. No stenosis of the aortic valve.  8. The aorta is normal unless otherwise noted.  9. The aortic root is normal in size and structure. 10. No cardiac source of embolism identified. 11. When compared to the prior study: No comparison.  LE Dopplers 08/02/2019 Right: Findings consistent with acute deep vein thrombosis involving the right peroneal veins.  Left: Findings consistent with acute deep vein thrombosis involving the left common femoral vein, left femoral vein, left proximal profunda vein, left popliteal vein, left posterior tibial veins, and left peroneal veins. Extending up into left iliac vein  and IVC.  07/21/2019 Right: Findings consistent with acute deep vein thrombosis involving the right peroneal veins. Left: Findings consistent with acute deep vein thrombosis involving the left popliteal vein, left posterior tibial veins, and left peroneal veins.  Vas [place] Transcranial Doppler W Bubbles 07/25/2019 No HITS heard heard at rest. No HITS heard heard during valsalva.   2D echo w/bubble 08/03/2019 Limited bubble study for shunt. No evidence for atrial  level right to left shunt.  Ct Abdomen Pelvis W Contrast 08/04/2019 1. Large fluid collection in the lower right anterior abdominal wall subcutaneous fat with what appears to be an internal surgical drain, favored to represent a postoperative hematoma or proteinaceous seroma, however, the possibility of an abscess is not excluded in light of the patient's fever.  2. Small high attenuation fluid collection along the left pelvic sidewall which likely represents a hematoma, as above.  3. Additional incidental findings, as above.   PHYSICAL EXAM   General - Well nourished, well developed, middle-aged African-American male.  He has right hemicraniectomy surgical incision on the scalp. Has chills during rounding  Ophthalmologic - fundi not visualized due to noncooperation.  Cardiovascular - Regular rhythm, tachycardia.  Neuro - patient is wake, still has chills but diminished, eyes open spontaneously. Nonverbal, but able to follow all simple commands on the right hand and foot. Eyes in right gaze position, barely cross midline, not blinking to visual threat to the left, PERRL. Left facial droop.  Tongue protrusion not corporative.  Has spontaneous movement of RUE against gravity 4/5 at least and RLE in bed 2+/5 at  least. On pain stimulation, mild withdraw of LLE, but no movement of LUE. DTR 1+ and no babinski. Sensation, coordination and gait not tested.   ASSESSMENT/PLAN [name] is a 43 y.o. male with no significant past medical history found down x 2 days nonverbal with L hemiparesis.   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 Trace L midline shift. ELVO at R ICA, R A1, R MCA.  MRI  Large R brain infarct sparing R PCA. Cytotoxic edema but no hemorrhage. Stable trace midline  shift.  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 R ICA terminus, R ACA, R MCA occluded.   CT head similar w/ low density and sweddling R ACA and MCA territories, now with 5-96mm midline shift. no hemorrhage  CT head acute R ACA and MCA infarct w/ progressive swelling decompressed through craniectomy defect.   CTA head and neck stable MLS. R ICA occlusion in neck that continues into R ACA and MCA. Mild petechial hemorrhage at basal ganglia  CT Head 8/29 - Unchanged appearance of massive right MCA and ACA territory infarcts with parenchyma extending through decompressive craniectomy. Small focus of suspected hemorrhage adjacent to the right caudate head.  CT Head WO - 07/30/19 - New hematoma within the infarcted tissue   CT repeat 08/01/2019 stable w/o change  2D Echo EF 60-65%  2D echo w/ bubble ordered no LRS   LE venous doppler DVT in R peroneal, L popliteal, L posterior tibial and L peroneal  LE venous doppler repeat DVT in R peroneal, L popliteal, L posterior tibial and L peroneal  CT chest Nonocclusive thrombus seen within the left posterior lower lobe segmental artery.   TCD bubble no HITS, no PFO  LDL 83  HgbA1c 5.4  UDS positive THC  Hypercoagulable and autoimmune work up negative  Heparin 1500 subq for VTE prophylaxis  No antithrombotic prior to admission, treated DVT with heparin IV, then  discontinued due to hemorrhagic conversion with right frontal hematoma, now resumed heparin IV given extensive DVT and PE  Therapy recommendations:  SNF  Disposition:  pending   Cyctotoxic cerebral edema  S/p R decompressive hemicraniectomy (Nundkumar) w/ flap R abd 07/19/2019  PICC placed - keep for now per Leonie Man  Given One dose of 23.4% 8/26; 3%  off 8/30 1700  Na 146 -> 140->135->136  On NS to 75cc and decrease free water to 150 Q8  Monitor Na daily  CT repeat 07/30/19 - New parenchymal hemorrhage within the infarcted tissue  Off helmet to further release  pressure  CT repeat 08/01/2019 stable w/o change  Close neuro check since on heparin IV  Right ICA occlusion  MRA and CTA showed right ICA occlusion from origin to terminal  Wife denies any head trauma  Wife stated that pt had recent aggressive exercise with weight lifting  Concerning dissection as working diagnosis of stroke etiology   B LE DVT Small LLL PE  LE venous doppler DVT in R peroneal, L popliteal, L posterior tibial and L peroneal  Etiology unclear  Started iv heparin 07/26/2019  IVC filter placed 8/27. Plan retrieval in 8-12 weeks as an IP in an IR clinic  Hypercoagulable work up negative   Off SCDs  LE venous doppler 08/02/19 repeat extensive DVT in  R peroneal, L popliteal, L posterior tibial and L peroneal up to the IVC filter  CT chest Nonocclusive thrombus seen within the left posterior lower lobe segmental artery.   hydrated w/ IVF @ 75cc, continue free water and TF  Treated with IV Heparin but then off secondary to intracranial hemorrhage  Restarted IV heparin given extensive DVT and PE with close neuro monitoring. Pt remains stable.  Hematology consult, appreciate help - hypercoag w/u neg.  At risk for recurrent clots. AC recommended w/ lovenox at d/c.  Factor V Leiden, homocysteine, JAK2, Prot C&S, Prothombin gene mutation  pending from 08/04/19  Appreciate TRH assistance on this complicated case  Abdominal wall fluid collection  Large fluid collection seen on CT abdomen  Dr. Kathyrn Sheriff did fluid aspiration and seems to be old blood component  Fluid aspiration sent for culture 9/11 - no growth < 24 hrs  requested ID consult   Seizure-like activity  Continue Keppra   EEG continuous slowing, excessive beta activity, sleep spindle asymmetry decreased R->related to stroke and sedation. No SZ  Long Term EEG cortical dysfunction in right frontal region  Seizure precautions  Febrile, Leukocytosis  Tmax -  101.8->99.7->101.8->101.5->99.6->98.3-100.8-102.1-103.9->103.3  WBC - 11.8->14.5->16.1->13.5->17.5->16.4-13.6-12.7->13.1  CXR x 2 NAD  U/A x 3 - negative. UCx neg   Blood cultures - neg, repeated again 08/04/19 -> no growth on empiric unasyn -> changed to vanco, cefepime and flagyl 08/05/19  Lactic acid 1.2->2.0  CT abd/pel concerning for abdominal wall fluid collection likely old hematoma  Hepatitis panel 9/12 - pending   Tachycardia  HR 98 - 120  likely due to fever and dehydration and PE  Hydration with IVF, TF and free water  Increase metoprolol to 50 Q8h  EKG ST, rate 120 w/ possible LA enlargement  Troponin series neg x 3    Still more likely due to fever and SIRS  Hyperlipidemia  Home meds:  no statin  LDL 83, goal < 70  Was on lipitor 20 mg daily   D/c statin due to elevated LFT  Dysphagia . Secondary to stroke . NPO  Cortrak w/ TF @ 55cc  Decreased free water 150cc q4->q8   Speech on board - continue NPO  Other Stroke Risk Factors  Former Cigarette smoker, quit 3 yrs ago  ETOH use  Substance abuse UDS - positive for THC   Obesity, Body mass index is 31.85 kg/m., recommend weight loss, diet and exercise as appropriate   Other Active Problems  Elevated LFTs -  AST - 132->219->97->67 ; ALT - 68->244->174->116->80 - improving   Hypokalemia - 3.5->3.4 - supplement ->3.5- 3.2->3.4 - replaced - 4.5->4.2->3.9->3.6  Normocytic anemia - hematology felt d/t ongoing blood draws 11.9-11.2-10.3->9.4->9.4  Hospital day # 20 The patient fever seems to have come down as well as chills and it is unclear if this is response to bromocriptine or to the antibiotics.    ID and internal medicine help appreciated.  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  Baclofen and bromocriptine have been started for central neurogenic fever.  I had a long discussion with patient's wife regarding need for possible PEG tube but she wants to hold off for now and  see if he can swallow spontaneously over the next few days.  We will ask speech therapy to evaluate this patient is critically ill due to right large infarct, cerebral edema, hemorrhagic conversion, extensive DVT, PE, tachycardia, right carotid occlusion and at significant risk of neurological worsening, death form recurrent stroke, brain  herniation, hemorrhagic conversion, PE, heart failure, respiratory failure, status epilepticus. This patient's care requires constant monitoring of vital signs, hemodynamics, respiratory and cardiac monitoring, review of multiple databases, neurological assessment, discussion with family, other specialists and medical decision making of high complexity. I spent 30 minutes of neurocritical care time in the care of this patient.  Antony Contras, MD  To contact Stroke Continuity provider, please refer to [email or web address] After hours, contact General Neurology

## 2019-08-08 NOTE — Progress Notes (Signed)
ANTICOAGULATION CONSULT NOTE  Pharmacy Consult:  Heparin Indication:  DVT/PE  Allergies  Allergen Reactions  . Hydrocodone Nausea Only    Patient Measurements: Height: 5\' 11"  (180.3 cm) Weight: 228 lb 6.3 oz (103.6 kg) IBW/kg (Calculated) : 75.3 Heparin Dosing Weight: 96 kg  Vital Signs: Temp: 98.1 F (36.7 C) (09/14 1945) Temp Source: Oral (09/14 1945) BP: 122/78 (09/14 2000) Pulse Rate: 106 (09/14 2000)  Labs: Recent Labs    08/06/19 0635 08/06/19 1203 08/07/19 0300  08/08/19 0530 08/08/19 0857 08/08/19 1115 08/08/19 2003  HGB 9.4*  --  9.7*  --  9.0*  --   --   --   HCT 29.4*  --  29.9*  --  28.4*  --   --   --   PLT 221  --  282  --  274  --   --   --   HEPARINUNFRC 0.42  --   --    < > 0.39  --  0.22* 0.37  CREATININE 0.86  --  0.77  --   --   --   --   --   CKTOTAL  --  2,594* 3,198*  --   --  3,012*  --   --    < > = values in this interval not displayed.    Estimated Creatinine Clearance: 148.8 mL/min (by C-G formula based on SCr of 0.77 mg/dL).   Assessment: 67 YOM presented with acute ischemic CVA and did not receive tPA.  There was an incidental finding of DVT and IVC filter was placed on 07/21/19 as patient was not a candidate for [facility] due to craniectomy.  He was cleared to start IV heparin, but then stopped on 07/30/19 due to right frontal lobe hemorrhage.  CTA on 08/01/19 showed non-occlusive thrombus and repeat Doppler on 08/02/19 positive for bilateral DVTs.  Heparin restarted on 08/02/19 given extensive DVTs and PE.  CT abd shows stable wall hematoma under the abdominal flap incisions site - continuing heparin at this time. -heparin level at goal on 1150 units/hr   Goal of Therapy:  Heparin level 0.3-0.5 units/ml Monitor platelets by anticoagulation protocol: Yes    Plan:   - Continue Heparin at 1150 unitshr -Daily heparin level and CBC  Hildred Laser, PharmD Clinical Pharmacist **Pharmacist phone directory can now be found on [email or web address] ([email or web address]).   Listed under [place].

## 2019-08-08 NOTE — Progress Notes (Signed)
[place]   Reason for visit: Follow up on fever  Vancomycin, day 5 Cefepime, day 5 Flagyl, day 5  Interval History: Patient's wife is again at bedside today during exam.  Fever has persisted over the weekend but he remains hemodynamically stable with exception of baseline tachycardia.  Minimal change in neurologic exam compared to last week.  Wound and blood cultures remain negative/unrevealing.  Nursing reports concerns regarding Dobbhoff tube as the patient has been taking at his line more recently.  He is currently in mittens as a result. Fever curve, WBC & Cr trends, imaging, cx results, and ABX usage allindependently reviewed    Current Facility-Administered Medications:  .  0.9 %  sodium chloride infusion, , Intravenous, PRN, Burnetta Sabin L, NP, Last Rate: 10 mL/hr at 08/01/19 1715 .  0.9 %  sodium chloride infusion, , Intravenous, Continuous, Mercy Riding, MD, Stopped at 08/08/19 1703 .  acetaminophen (TYLENOL) tablet 650 mg, 650 mg, Oral, Q4H PRN **OR** acetaminophen (TYLENOL) solution 650 mg, 650 mg, Per Tube, Q4H PRN, 650 mg at 08/07/19 1550 **OR** acetaminophen (TYLENOL) suppository 650 mg, 650 mg, Rectal, Q4H PRN, Biby, Sharon L, NP, 650 mg at 07/28/19 1710 .  baclofen (LIORESAL) 10 mg/mL oral suspension 10 mg, 10 mg, Oral, TID, Cyndia Skeeters, Taye T, MD, 10 mg at 08/08/19 1502 .  bromocriptine (PARLODEL) tablet 2.5 mg, 2.5 mg, Oral, BID, Garvin Fila, MD, 2.5 mg at 08/08/19 0932 .  busPIRone (BUSPAR) tablet 30 mg, 30 mg, Oral, TID, Agarwala, Ravi, MD, 30 mg at 08/08/19 1502 .  chlorhexidine (PERIDEX) 0.12 % solution 15 mL, 15 mL, Mouth Rinse, BID, Garvin Fila, MD, 15 mL at 08/08/19 0932 .  Chlorhexidine Gluconate Cloth 2 % PADS 6 each, 6 each, Topical, Daily, Rosalin Hawking, MD, 6 each at 08/08/19 0933 .  famotidine (PEPCID) 40 MG/5ML suspension 20 mg, 20 mg, Per Tube, BID, Biby, Sharon L, NP, 20 mg at 08/08/19 0933 .  feeding supplement (OSMOLITE  1.5 CAL) liquid 1,000 mL, 1,000 mL, Per Tube, Continuous, Rosalin Hawking, MD, Last Rate: 60 mL/hr at 08/08/19 1703 .  feeding supplement (PRO-STAT SUGAR FREE 64) liquid 30 mL, 30 mL, Per Tube, TID, Rosalin Hawking, MD, 30 mL at 08/08/19 1502 .  free water 150 mL, 150 mL, Per Tube, Q8H, Gonfa, Taye T, MD, 150 mL at 08/08/19 1314 .  heparin ADULT infusion 100 units/mL (25000 units/235mL sodium chloride 0.45%), 1,150 Units/hr, Intravenous, Continuous, Rolla Flatten, [facility], Last Rate: 11.5 mL/hr at 08/08/19 1709, 1,150 Units/hr at 08/08/19 1709 .  labetalol (NORMODYNE) injection 5-20 mg, 5-20 mg, Intravenous, Q2H PRN, Biby, Sharon L, NP .  levETIRAcetam (KEPPRA) 100 MG/ML solution 500 mg, 500 mg, Per Tube, BID, Biby, Sharon L, NP, 500 mg at 08/08/19 0933 .  MEDLINE mouth rinse, 15 mL, Mouth Rinse, q12n4p, Garvin Fila, MD, 15 mL at 08/08/19 1502 .  metoprolol tartrate (LOPRESSOR) tablet 50 mg, 50 mg, Per Tube, Q8H, Rosalin Hawking, MD, 50 mg at 08/08/19 1314 .  senna-docusate (Senokot-S) tablet 1 tablet, 1 tablet, Oral, QHS PRN, Biby, Sharon L, NP .  sodium chloride flush (NS) 0.9 % injection 10-40 mL, 10-40 mL, Intracatheter, Q12H, Biby, Sharon L, NP, 10 mL at 08/08/19 0933 .  sodium chloride flush (NS) 0.9 % injection 10-40 mL, 10-40 mL, Intracatheter, PRN, Donzetta Starch, NP, 10 mL at 08/02/19 2213   Physical Exam:   Vitals:   08/08/19 1600 08/08/19 1700  BP:  114/76 112/76  Pulse: (!) 101 98  Resp: 16 20  Temp: (!) 101.4 F (38.6 C)   SpO2: 100% 100%   Physical Exam Lines: RT arm PICC, DHT, condom urinary catheter, PIV Gen: Minimal verbal responses but does able to follow some simple commands only on his right side as he has persistent dense left-sided hemiplegia, unable to maintain gaze Head: +craniotomy incision c/d/i with staples w/o surrounding erythema or drainage, no temporal wasting evident EENT: Significant anisocoria, right pupil is more reactive, unable to maintain gaze making exam  difficult, MMM, adequate dentition, +DHT Neck: supple, no JVD CV: tachycardic rate, RR, no murmurs evident Pulm: CTA bilaterally, mild experiratory wheeze, no retractions Abd: soft except to right lower quadrant where there is a palpable mass adjacent to his surgical incision, no expressible drainage from his wound nor overlying erythema is evident at present, +BS Extrems:  trace LE edema, 2+ pulses Skin: no rashes, adequate skin turgor Neuro: Dense left-sided hemiplegia with an some lesser deficits to his right arm and leg, alertness is more making for difficult exam, the patient is unable to maintain gaze, + early contractures to his left arm and less so to his left leg positive Babinski to his left lower extremity and mute on his right lower extremity  Review of Systems:  Review of Systems  Unable to perform ROS: Medical condition     Lab Results  Component Value Date   WBC 11.2 (H) 08/08/2019   HGB 9.0 (L) 08/08/2019   HCT 28.4 (L) 08/08/2019   MCV 87.1 08/08/2019   PLT 274 08/08/2019    Lab Results  Component Value Date   CREATININE 0.77 08/07/2019   BUN 17 08/07/2019   NA 136 08/07/2019   K 3.6 08/07/2019   CL 106 08/07/2019   CO2 24 08/07/2019    Lab Results  Component Value Date   ALT 60 (H) 08/07/2019   AST 118 (H) 08/07/2019   ALKPHOS 79 08/07/2019     Microbiology: Recent Results (from the past 240 hour(s))  Culture, blood (routine x 2)     Status: None   Collection Time: 07/30/19  1:09 PM   Specimen: BLOOD LEFT ARM  Result Value Ref Range Status   Specimen Description BLOOD LEFT ARM  Final   Special Requests   Final    BOTTLES DRAWN AEROBIC AND ANAEROBIC Blood Culture adequate volume   Culture   Final    NO GROWTH 5 DAYS Performed at [facility], [address] [place], [place] [address]    Report Status 08/08/2019 FINAL  Final  Culture, blood (routine x 2)     Status: None   Collection Time: 07/30/19  1:09 PM   Specimen: BLOOD RIGHT HAND   Result Value Ref Range Status   Specimen Description BLOOD RIGHT HAND  Final   Special Requests   Final    BOTTLES DRAWN AEROBIC ONLY Blood Culture results may not be optimal due to an inadequate volume of blood received in culture bottles   Culture   Final    NO GROWTH 5 DAYS Performed at [facility], [place] [address], [place], [place] [address]    Report Status 08/08/2019 FINAL  Final  Culture, blood (Routine X 2) w Reflex to ID Panel     Status: None   Collection Time: 08/01/19  4:50 AM   Specimen: BLOOD  Result Value Ref Range Status   Specimen Description BLOOD PICC LINE  Final  Special Requests   Final    BOTTLES DRAWN AEROBIC ONLY Blood Culture adequate volume   Culture   Final    NO GROWTH 5 DAYS Performed at [facility], [place] [address], [place], [place] [address]    Report Status 08/06/2019 FINAL  Final  Culture, blood (Routine X 2) w Reflex to ID Panel     Status: None   Collection Time: 08/01/19  4:50 AM   Specimen: BLOOD RIGHT HAND  Result Value Ref Range Status   Specimen Description BLOOD RIGHT HAND  Final   Special Requests   Final    BOTTLES DRAWN AEROBIC ONLY Blood Culture adequate volume   Culture   Final    NO GROWTH 5 DAYS Performed at [facility], [place] [address], [place], [place] [address]    Report Status 08/06/2019 FINAL  Final  Culture, Urine     Status: None   Collection Time: 08/04/19 12:48 PM   Specimen: Urine, Random  Result Value Ref Range Status   Specimen Description URINE, RANDOM  Final   Special Requests NONE  Final   Culture   Final    NO GROWTH Performed at [facility], [place] [address], [place], [place] [address]    Report Status 08/05/2019 FINAL  Final  Culture, blood (routine x 2)     Status: None (Preliminary result)   Collection Time: 08/04/19  6:59 PM   Specimen: BLOOD LEFT ARM  Result Value Ref Range Status   Specimen Description BLOOD LEFT ARM  Final   Special Requests   Final    BOTTLES DRAWN  AEROBIC ONLY Blood Culture results may not be optimal due to an inadequate volume of blood received in culture bottles   Culture   Final    NO GROWTH 4 DAYS Performed at [facility], [place] [address] [place], [place] [address]    Report Status PENDING  Incomplete  Culture, blood (routine x 2)     Status: None (Preliminary result)   Collection Time: 08/04/19  6:59 PM   Specimen: BLOOD LEFT HAND  Result Value Ref Range Status   Specimen Description BLOOD LEFT HAND  Final   Special Requests   Final    BOTTLES DRAWN AEROBIC ONLY Blood Culture results may not be optimal due to an inadequate volume of blood received in culture bottles   Culture   Final    NO GROWTH 4 DAYS Performed at [facility], [place] [address], [place], [place] [address]    Report Status PENDING  Incomplete  Body fluid culture     Status: None   Collection Time: 08/05/19 11:33 AM   Specimen: Body Fluid  Result Value Ref Range Status   Specimen Description FLUID ABDOMEN  Final   Special Requests NONE  Final   Gram Stain   Final    RARE WBC PRESENT,BOTH PMN AND MONONUCLEAR NO ORGANISMS SEEN    Culture   Final    NO GROWTH 3 DAYS Performed at [facility], [address], [place], [place] [address]    Report Status 08/08/2019 FINAL  Final  Culture, blood (routine x 2)     Status: None (Preliminary result)   Collection Time: 08/06/19  4:30 PM   Specimen: BLOOD  Result Value Ref Range Status   Specimen Description BLOOD LEFT ANTECUBITAL  Final   Special Requests AEROBIC BOTTLE ONLY Blood Culture adequate volume  Final   Culture  Final    NO GROWTH 2 DAYS Performed at [facility], [place] [address] [place], [place] [address]    Report Status PENDING  Incomplete  Culture, blood (routine x 2)     Status: None (Preliminary result)   Collection Time: 08/06/19  4:42 PM   Specimen: BLOOD LEFT HAND  Result Value Ref Range Status   Specimen Description BLOOD LEFT HAND  Final   Special Requests  NONE  Final   Culture   Final    NO GROWTH 2 DAYS Performed at [facility], [place] [address] [place], [place] [address]    Report Status PENDING  Incomplete    Impression/Plan: Patient is a 43 year old African-American male with recent devastating stroke requiring craniotomy who then developed hemorrhagic conversion and has subsequently developed fever and leukocytosis.  1. Fever -multiple causes of fever both infectious and noninfectious are possibilities at present.  At the present time given the pattern and consistency of his fever curve, central fever from his primary CNS process with severe thrombotic disease to his right hemisphere and subsequent hemorrhagic conversion would be the leading probability.  Other considerations would involve his thrombotic pulmonary embolus, bilateral lower extremity VT's and now confirmed right lower quadrant hematoma.  May continue to check blood cultures x2 whenever he has a fever > 100.5.  If the patient were to become hypotensive, then it would be reasonable to check a lactic acid as well and consider removing his invasive lines now that peripheral IVs have been established.  The patient's blood cultures and wound culture from his percutaneous fluid collection drainage are negative/unrevealing, so we will DC antibiotics at this time.  I explained the likelihood of the patient having persistent fever from a neurologic/central fever standpoint to his wife who appears to have a better understanding of this today.  At the end of today's visit she expressed understanding the patient will have ongoing risk for infection but as long as blood cultures and close attention are paid to his possibility of infection, this would ensure at least that infections would be called in early be of less consequence to him should they arise.  2. Abdominal fluid collection -I appreciate the neurosurgical service performing a percutaneous drainage of the patient's right lower  quadrant bone flap fluid collection.  Given clinical report of significant resistance and viscous fluid more consistent with a hematoma, this is my suspicion is well.  Obviously, he will remain at risk for secondary infection given the presence of his large abdominal hematoma, but over time this does have the possibility to resorb sterilely.  As his blood and wound cultures are negative at this time, I see no indication for continued broad-spectrum antibiotics and will DC his vancomycin, cefepime, and Flagyl.   3. Leukocytosis -mild elevation noted on today's CBC with white blood cell count of 11,200.  His white blood cell count has continued to wax and wane throughout his hospital course thus far.  Unsure if this is related to infection or more significant reactive processes as outlined above.  Would check CBC with differential daily until the patient consistently has a normal white blood cell count.  Will sign off, call with questions  33 minutes of critical care time spent

## 2019-08-09 ENCOUNTER — Inpatient Hospital Stay (HOSPITAL_COMMUNITY): Payer: Medicaid Other

## 2019-08-09 LAB — CULTURE, BLOOD (ROUTINE X 2)
Culture: NO GROWTH
Culture: NO GROWTH

## 2019-08-09 LAB — COMPREHENSIVE METABOLIC PANEL
ALT: 65 U/L — ABNORMAL HIGH (ref 0–44)
AST: 98 U/L — ABNORMAL HIGH (ref 15–41)
Albumin: 2.5 g/dL — ABNORMAL LOW (ref 3.5–5.0)
Alkaline Phosphatase: 74 U/L (ref 38–126)
Anion gap: 8 (ref 5–15)
BUN: 14 mg/dL (ref 6–20)
CO2: 25 mmol/L (ref 22–32)
Calcium: 8.5 mg/dL — ABNORMAL LOW (ref 8.9–10.3)
Chloride: 105 mmol/L (ref 98–111)
Creatinine, Ser: 0.79 mg/dL (ref 0.61–1.24)
GFR calc Af Amer: >60 mL/min (ref >60)
GFR calc non Af Amer: >60 mL/min (ref >60)
Glucose, Bld: 112 mg/dL — ABNORMAL HIGH (ref 70–99)
Potassium: 3.7 mmol/L (ref 3.5–5.1)
Sodium: 138 mmol/L (ref 135–145)
Total Bilirubin: 0.6 mg/dL (ref 0.3–1.2)
Total Protein: 6.6 g/dL (ref 6.5–8.1)

## 2019-08-09 LAB — CBC
HCT: 30.4 % — ABNORMAL LOW (ref 39.0–52.0)
Hemoglobin: 10 g/dL — ABNORMAL LOW (ref 13.0–17.0)
MCH: 28.4 pg (ref 26.0–34.0)
MCHC: 32.9 g/dL (ref 30.0–36.0)
MCV: 86.4 fL (ref 80.0–100.0)
Platelets: 278 10*3/uL (ref 150–400)
RBC: 3.52 MIL/uL — ABNORMAL LOW (ref 4.22–5.81)
RDW: 13.7 % (ref 11.5–15.5)
WBC: 10 10*3/uL (ref 4.0–10.5)
nRBC: 0 % (ref 0.0–0.2)

## 2019-08-09 LAB — CK: Total CK: 2063 U/L — ABNORMAL HIGH (ref 49–397)

## 2019-08-09 LAB — GLUCOSE, CAPILLARY
Glucose-Capillary: 100 mg/dL — ABNORMAL HIGH (ref 70–99)
Glucose-Capillary: 100 mg/dL — ABNORMAL HIGH (ref 70–99)
Glucose-Capillary: 100 mg/dL — ABNORMAL HIGH (ref 70–99)
Glucose-Capillary: 102 mg/dL — ABNORMAL HIGH (ref 70–99)
Glucose-Capillary: 108 mg/dL — ABNORMAL HIGH (ref 70–99)
Glucose-Capillary: 113 mg/dL — ABNORMAL HIGH (ref 70–99)

## 2019-08-09 LAB — HEPARIN LEVEL (UNFRACTIONATED)
Heparin Unfractionated: 0.27 IU/mL — ABNORMAL LOW (ref 0.30–0.70)
Heparin Unfractionated: <0.1 IU/mL — ABNORMAL LOW (ref 0.30–0.70)

## 2019-08-09 LAB — FACTOR 5 LEIDEN

## 2019-08-09 NOTE — Progress Notes (Addendum)
ANTICOAGULATION CONSULT NOTE  Pharmacy Consult:  Heparin Indication:  DVT/PE  Allergies  Allergen Reactions  . Hydrocodone Nausea Only    Patient Measurements: Height: 5\' 11"  (180.3 cm) Weight: 228 lb 9.9 oz (103.7 kg) IBW/kg (Calculated) : 75.3 Heparin Dosing Weight: 96 kg  Vital Signs: Temp: 98.8 F (37.1 C) (09/15 1558) Temp Source: Oral (09/15 1558) BP: 126/83 (09/15 1800) Pulse Rate: 25 (09/15 1800)  Labs: Recent Labs    08/07/19 0300  08/08/19 0530 08/08/19 0857  08/08/19 2003 08/09/19 0213 08/09/19 1710  HGB 9.7*  --  9.0*  --   --   --  10.0*  --   HCT 29.9*  --  28.4*  --   --   --  30.4*  --   PLT 282  --  274  --   --   --  278  --   HEPARINUNFRC  --    < > 0.39  --    < > 0.37 0.27* <0.10*  CREATININE 0.77  --   --   --   --   --  0.79  --   CKTOTAL 3,198*  --   --  3,012*  --   --  2,063*  --    < > = values in this interval not displayed.    Estimated Creatinine Clearance: 149 mL/min (by C-G formula based on SCr of 0.79 mg/dL).   Assessment: 41 YOM presented with acute ischemic CVA and did not receive tPA.  There was an incidental finding of DVT and IVC filter was placed on 07/21/19 as patient was not a candidate for [facility] due to craniectomy.  He was cleared to start IV heparin, but then stopped on 07/30/19 due to right frontal lobe hemorrhage.  CTA on 08/01/19 showed non-occlusive thrombus and repeat Doppler on 08/02/19 positive for bilateral DVTs.  Heparin restarted on 08/02/19 given extensive DVTs and PE.    Hep lvl this pm was undetectable  But rate was never increased today from 1150 to 1250 units/hr.  Will order this increase now.  Goal of Therapy:  Heparin level 0.3-0.5 units/ml Monitor platelets by anticoagulation protocol: Yes    Plan:   Increase heparin to 1250 units/hr Recheck hep lvl 0300 Daily Hep lvl cbc Monitor for s/sx of bleeding  Alanda Slim, PharmD, [facility] Clinical Pharmacist Please see AMION for all Pharmacists' Contact Phone  Numbers 08/09/2019, 6:42 PM

## 2019-08-09 NOTE — Progress Notes (Signed)
Modified Barium Swallow Progress Note  Patient Details  Name: Matthew Black MRN: [id number] Date of Birth: [birth year]  Today's Date: 08/09/2019  Modified Barium Swallow completed.  Full report located under Chart Review in the Imaging Section.  Brief recommendations include the following:  Clinical Impression  Pt presents with surprisingly safe swallow function, given the severity of his deficits. Pt's primary deficit was oral prep of solid texture, which he was unable to demonstrate. However, pt did chew up the barium tablet and swallow it. Pt tolerated trials of thin liquid, nectar thick liquid, and puree consistencies with adequate oral prep and propulsion. Swallow reflex triggered at the vallecula on nectar thick liquids and puree, and at the vallecula or pyriform sinus on thin liquids. Pt exhibited very trace flash penetration x1 on large consecutive boluses of thin liquid via straw, despite being challenged on multiple occasions. No post-swallow residue was noted in the vallecula or pyriform sinuses. Recommend beginning puree diet with thin liquids, crushed meds. SLP will follow acutely to assess diet tolerance and provide education, as well as to provide trials of advanced textures (solids). Safe swallow precautions were sent with pt back to his room. Results and recommendations discussed with RN.   Swallow Evaluation Recommendations  SLP Diet Recommendations: Dysphagia 1 (Puree) solids;Thin liquid   Liquid Administration via: Straw   Medication Administration: Crushed with puree   Supervision: Full assist for feeding;Staff to assist with self feeding;Full supervision/cueing for compensatory strategies   Compensations: Minimize environmental distractions;Slow rate;Small sips/bites   Postural Changes: Seated upright at 90 degrees   Oral Care Recommendations: Oral care QID  Matthew Black [facility], [place] Speech Language Pathologist [phone]  Shonna Chock 08/09/2019,3:16  PM

## 2019-08-09 NOTE — Progress Notes (Signed)
Nutrition Follow-up  DOCUMENTATION CODES:   Obesity unspecified  INTERVENTION:   Tube Feeding:  Increase Osmolite 1.5 to 60 ml/hr Increase Pro-Stat 30 mL to TID Provides 125 g of protein, 2460 kcals, 1094 mL of free water Meets 100% estimated calorie and protein needs  Once diet advanced, recommend considering transitioning to nocturnal needs Do not recommend d/c TF until pt able to meet at least 50-75% of needs via po route  NUTRITION DIAGNOSIS:   Inadequate oral intake related to acute illness as evidenced by NPO status.  Being addressed via TF   GOAL:   Patient will meet greater than or equal to 90% of their needs  Progressing  MONITOR:   TF tolerance, Weight trends, Labs, I & O's, Skin  REASON FOR ASSESSMENT:   Ventilator, Consult Enteral/tube feeding initiation and management  ASSESSMENT:   43 year old male who presented to the ED on 8/25 with AMS. No known PMH. CT head showed large R hemisphere infarct with edema and mild mass effect.  8/25 - Admit, emergent decompressive hemicraniectomy 8/27 - Acute DVT b/l LE, IV placement 8/31 - Extubated 9/01 - Cortrak placed, tube tip in stomach  Pt more alert, working with SLP. Plan for MBS today  Tolerating Osmolite 1.5 at 60 ml/hr via Cortrak  Labs: reviewed Meds: reviewed   Diet Order:   Diet Order            Diet NPO time specified  Diet effective now              EDUCATION NEEDS:   No education needs have been identified at this time  Skin:  Skin Assessment: Skin Integrity Issues: Skin Integrity Issues:: Stage II, Incisions Stage II: L face Incisions: abdomen, head  Last BM:  9/7  Height:   Ht Readings from Last 1 Encounters:  08/02/19 5\' 11"  (1.803 m)    Weight:   Wt Readings from Last 1 Encounters:  08/09/19 103.7 kg    Ideal Body Weight:  78.2 kg  BMI:  Body mass index is 31.89 kg/m.  Estimated Nutritional Needs:   Kcal:  [id number] kcals  Protein:  117-135  Fluid:   >/= 2.1 L/day    Kerman Passey MS, RDN, LDN, CNSC [phone] Pager  [phone] Weekend/On-Call Pager

## 2019-08-09 NOTE — Progress Notes (Addendum)
PROGRESS NOTE    Matthew Black  [id number] DOB: November 08, 1976 DOA: 07/19/2019 PCP: Colon Branch, MD   Brief Narrative:  Matthew Black an 43 y.o.malewithout significant known PMH who presented on 8/25 with AMS as well as L hemiparesis and aphasia. UDS + for THC. CT with large R hemispheric infarct with confluent cytotoxic edema in R ACA and MCA territories and mild mass effect. 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 He was admitted to [facility] with neurology consultation and started on 3% saline. Neurosurgery was consulted and Dr. Kathyrn Sheriff recommended early decompressive hemicraniectomy, which was performed that night. He subsequently developed hemorrhagic conversion with hematoma on 9/5; helmet was removed to further release intracranial pressure and heparin was discontinued. LE doppler also indicated DVT in R peroneal, L popliteal, L posterior tibial, and L peroneal veins; IVC filter was placed. Patient had seizure-like activity, for which he is on Keppra. He was subsequently found to have possible RLL infiltrate and with concern for aspiration he was made NPO and started on Unasyn. CTA was performed and it showed a non-occlusive LLL thrombus on 9/7.Subsequent repeat DVT US was performed 9/8 and showed acute R DVT of the peroneal veins as well as acute L DVT involving common femoral vein, L femoral vein, L proximal profunda vein, L popliteal vein, L posterior tibial veins, and L peroneal veins - and it extends up into the left iliac vein and IVC.Heparin has been resumed. Patient spiked fever to 102.1 on 9/9 while on Unasyn.  Had some leukocytosis which seems to have improved.  Also mild tachycardia.  Recent blood culture 9/7- so far.  No clear source of infection.  CT abdomen and pelvis, CXR, UA, urine and blood cultures ordered.  UA and CXR not impressive.  CT abdomen with large fluid collection in lower right anterior abdominal which could be hematoma, seroma or an  abscess.  Neurosurgery consulted and tapped the abdominal hematoma.  Cultures negative so far.  Antibiotics stopped.  Assessment & Plan:   Active Problems:   Stroke (cerebrum) (HCC)   Pressure injury of skin   Acute respiratory failure (HCC)   Endotracheal tube present   Deep vein thrombosis (DVT) of non-extremity vein   Hypokalemia   FUO (fever of unknown origin)   Acute blood loss anemia   SIRS (systemic inflammatory response syndrome) (HCC)   Leukocytosis   Primary hypercoagulable state ([place])   Acute pulmonary embolism without acute cor pulmonale (HCC)   Right MCA/ACA infarct with left hemiparesis: Patient is still encephalopathic but mental status might have slightly improved.  Acute metabolic encephalopathy due to massive CVA.  Has right ICA, MCA and ACA occlusions.  Status post hemicraniectomy with abdominal flap implant.  Hemorrhagic conversion of CVA when he started on heparin for DVT.  Heparin has been resumed.  Neurology/neurosurgery managing.  Bilateral lower extremity DVT/nonocclusive left lower lobe PE: Currently on heparin.  Also has IVC filter.  Stable abdominal wall hematoma under the abdominal flap incision site.  Has edema of the left lower extremity.  Seizure-like activity: On Keppra as per neurology.  SIRS/fever/tachycardia/tachypnea/leukocytosis:Afebrile today.  Extensive work-up did not reveal any clear source of infection.  Suspected to be  associated  with hemorrhagic CVA. Antibiotics stopped.  Chills resolved with bromocriptine.  Abdominal hematoma: CT abdomen showed large fluid collection in lower right anterior abdominal which could be hematoma, seroma or an abscess.  Neurosurgery consulted and tapped the abdominal hematoma.  Cultures negative so far.  Elevated CK/Elevated liver enzymes:Found on the floor. CK elevated  due to rhabdomyolysis.  Also has mild elevated liver enzymes.  Hepatitis panel negative.  Continue current IV fluids.  Normocytic anemia:  Currently H&H stable.  Dysphagia: Encephalopathy due to massive CVA.  On feeding tube.Speech following and planned to do MBS.  Nutrition Problem: Inadequate oral intake Etiology: acute illness      DVT prophylaxis: Heparin IV Code Status: Full Family Communication: None present at the bedside Disposition Plan: Undetermined at this point.SNF after improvement in the mental status   Procedures: Hemicraniectomy  Antimicrobials:  Anti-infectives (From admission, onward)   Start     Dose/Rate Route Frequency Ordered Stop   08/05/19 1000  vancomycin (VANCOCIN) 1,250 mg in sodium chloride 0.9 % 250 mL IVPB  Status:  Discontinued     1,250 mg 166.7 mL/hr over 90 Minutes Intravenous Every 12 hours 08/04/19 1856 08/08/19 0914   08/05/19 0600  ceFEPIme (MAXIPIME) 2 g in sodium chloride 0.9 % 100 mL IVPB  Status:  Discontinued     2 g 200 mL/hr over 30 Minutes Intravenous Every 8 hours 08/04/19 1856 08/08/19 0914   08/04/19 2200  metroNIDAZOLE (FLAGYL) IVPB 500 mg  Status:  Discontinued     500 mg 100 mL/hr over 60 Minutes Intravenous Every 8 hours 08/04/19 1743 08/06/19 1548   08/04/19 1830  vancomycin (VANCOCIN) 2,000 mg in sodium chloride 0.9 % 500 mL IVPB     2,000 mg 250 mL/hr over 120 Minutes Intravenous  Once 08/04/19 1805 08/04/19 2053   08/04/19 1815  ceFEPIme (MAXIPIME) 2 g in sodium chloride 0.9 % 100 mL IVPB     2 g 200 mL/hr over 30 Minutes Intravenous  Once 08/04/19 1805 08/04/19 1842   07/31/19 2200  Ampicillin-Sulbactam (UNASYN) 3 g in sodium chloride 0.9 % 100 mL IVPB  Status:  Discontinued     3 g 200 mL/hr over 30 Minutes Intravenous Every 6 hours 07/31/19 1401 08/04/19 1743   07/31/19 1415  Ampicillin-Sulbactam (UNASYN) 3 g in sodium chloride 0.9 % 100 mL IVPB     3 g 200 mL/hr over 30 Minutes Intravenous STAT 07/31/19 1401 07/31/19 1551   07/19/19 2315  bacitracin 50,000 Units in sodium chloride 0.9 % 500 mL irrigation  Status:  Discontinued       As needed  07/19/19 2315 07/19/19 2339      Subjective:  Patient seen and examined at bedside this morning.  Hemodynamically stable.  Afebrile today.  Chills have resolved.  Continues to remain encephalopathic.  Did not follow any command to me today.  Looks comfortable .  Objective: Vitals:   08/09/19 0700 08/09/19 0800 08/09/19 0900 08/09/19 1000  BP: 111/72 120/77 118/80 114/67  Pulse: 88 87 95 92  Resp: (!) 22 18 (!) 32 17  Temp: 98.4 F (36.9 C)     TempSrc: Oral     SpO2: 100% 100% 100% 100%  Weight:      Height:        Intake/Output Summary (Last 24 hours) at 08/09/2019 1121 Last data filed at 08/09/2019 0600 Gross per 24 hour  Intake 2772.03 ml  Output 1900 ml  Net 872.03 ml   Filed Weights   08/05/19 0455 08/06/19 0500 08/09/19 0500  Weight: 105.3 kg 103.6 kg 103.7 kg    Examination:  General exam: Encephalopathic HEENT: Eyes open, gaze deviated to the right, head turned to the right, feeding tube Respiratory system: Bilateral equal air entry, normal vesicular breath  sounds, no wheezes or crackles  Cardiovascular system: S1 & S2 heard, RRR. No JVD, murmurs, rubs, gallops or clicks. Gastrointestinal system:: Abdomen is mildly distended, soft and nontender. No organomegaly or masses felt. Normal bowel sounds heard.  Surgical wound on the right lower quadrant with the staples.Distended area of abdomen underlying the surgical wound. Central nervous system: Does not follow command, awake but not alert or oriented  extremities: Left lower extremity edema. Skin: No rashes, lesions or ulcers,no icterus ,no pallor   Data Reviewed: I have personally reviewed following labs and imaging studies  CBC: Recent Labs  Lab 08/05/19 0457 08/06/19 0635 08/07/19 0300 08/08/19 0530 08/09/19 0213  WBC 12.7* 13.1* 15.1* 11.2* 10.0  HGB 9.4* 9.4* 9.7* 9.0* 10.0*  HCT 29.9* 29.4* 29.9* 28.4* 30.4*  MCV 87.4 88.0 86.7 87.1 86.4  PLT 222 221 282 274 0000000   Basic Metabolic Panel: Recent  Labs  Lab 08/03/19 0512 08/04/19 0440 08/05/19 0457 08/06/19 0635 08/07/19 0300 08/09/19 0213  NA 136 135  --  136 136 138  K 4.2 3.9  --  3.6 3.6 3.7  CL 104 103  --  103 106 105  CO2 22 22  --  23 24 25   GLUCOSE 138* 130*  --  134* 123* 112*  BUN 24* 23*  --  19 17 14   CREATININE 0.95 0.89  --  0.86 0.77 0.79  CALCIUM 8.7* 8.4*  --  8.4* 8.2* 8.5*  MG  --  2.1 2.1 2.0 2.1  --   PHOS  --  3.5 3.7 3.2 3.2  --    GFR: Estimated Creatinine Clearance: 149 mL/min (by C-G formula based on SCr of 0.79 mg/dL). Liver Function Tests: Recent Labs  Lab 08/04/19 0440 08/06/19 0635 08/07/19 0300 08/09/19 0213  AST 67* 105* 118* 98*  ALT 80* 68* 60* 65*  ALKPHOS 94 85 79 74  BILITOT 0.7 0.6 1.2 0.6  PROT 7.2 7.3 6.5 6.6  ALBUMIN 2.8* 2.7* 2.6* 2.5*   No results for input(s): LIPASE, AMYLASE in the last 168 hours. No results for input(s): AMMONIA in the last 168 hours. Coagulation Profile: No results for input(s): INR, PROTIME in the last 168 hours. Cardiac Enzymes: Recent Labs  Lab 08/06/19 1203 08/07/19 0300 08/08/19 0857 08/09/19 0213  CKTOTAL 2,594* 3,198* 3,012* 2,063*   BNP (last 3 results) No results for input(s): PROBNP in the last 8760 hours. HbA1C: No results for input(s): HGBA1C in the last 72 hours. CBG: Recent Labs  Lab 08/08/19 0728 08/08/19 1938 08/08/19 2336 08/09/19 0425 08/09/19 0717  GLUCAP 146* 129* 108* 100* 113*   Lipid Profile: No results for input(s): CHOL, HDL, LDLCALC, TRIG, CHOLHDL, LDLDIRECT in the last 72 hours. Thyroid Function Tests: No results for input(s): TSH, T4TOTAL, FREET4, T3FREE, THYROIDAB in the last 72 hours. Anemia Panel: No results for input(s): VITAMINB12, FOLATE, FERRITIN, TIBC, IRON, RETICCTPCT in the last 72 hours. Sepsis Labs: No results for input(s): PROCALCITON, LATICACIDVEN in the last 168 hours.  Recent Results (from the past 240 hour(s))  Culture, blood (routine x 2)     Status: None   Collection Time:  07/30/19  1:09 PM   Specimen: BLOOD LEFT ARM  Result Value Ref Range Status   Specimen Description BLOOD LEFT ARM  Final   Special Requests   Final    BOTTLES DRAWN AEROBIC AND ANAEROBIC Blood Culture adequate volume   Culture   Final    NO GROWTH 5 DAYS Performed at [facility]  Lab, [address], [place], [place] [address]    Report Status 08/08/2019 FINAL  Final  Culture, blood (routine x 2)     Status: None   Collection Time: 07/30/19  1:09 PM   Specimen: BLOOD RIGHT HAND  Result Value Ref Range Status   Specimen Description BLOOD RIGHT HAND  Final   Special Requests   Final    BOTTLES DRAWN AEROBIC ONLY Blood Culture results may not be optimal due to an inadequate volume of blood received in culture bottles   Culture   Final    NO GROWTH 5 DAYS Performed at [facility], [place] [address] [place], [place] [address]    Report Status 08/08/2019 FINAL  Final  Culture, blood (Routine X 2) w Reflex to ID Panel     Status: None   Collection Time: 08/01/19  4:50 AM   Specimen: BLOOD  Result Value Ref Range Status   Specimen Description BLOOD PICC LINE  Final   Special Requests   Final    BOTTLES DRAWN AEROBIC ONLY Blood Culture adequate volume   Culture   Final    NO GROWTH 5 DAYS Performed at [facility], [place] [address] [place], [place] [address]    Report Status 08/06/2019 FINAL  Final  Culture, blood (Routine X 2) w Reflex to ID Panel     Status: None   Collection Time: 08/01/19  4:50 AM   Specimen: BLOOD RIGHT HAND  Result Value Ref Range Status   Specimen Description BLOOD RIGHT HAND  Final   Special Requests   Final    BOTTLES DRAWN AEROBIC ONLY Blood Culture adequate volume   Culture   Final    NO GROWTH 5 DAYS Performed at [facility], [place] [address] [place], [place] [address]    Report Status 08/06/2019 FINAL  Final  Culture, Urine     Status: None   Collection Time: 08/04/19 12:48 PM   Specimen: Urine, Random  Result Value Ref Range  Status   Specimen Description URINE, RANDOM  Final   Special Requests NONE  Final   Culture   Final    NO GROWTH Performed at [facility], [place] [address], [place], [place] [address]    Report Status 08/05/2019 FINAL  Final  Culture, blood (routine x 2)     Status: None   Collection Time: 08/04/19  6:59 PM   Specimen: BLOOD LEFT ARM  Result Value Ref Range Status   Specimen Description BLOOD LEFT ARM  Final   Special Requests   Final    BOTTLES DRAWN AEROBIC ONLY Blood Culture results may not be optimal due to an inadequate volume of blood received in culture bottles   Culture   Final    NO GROWTH 5 DAYS Performed at [facility], [place] [address], [place], [place] [address]    Report Status 08/09/2019 FINAL  Final  Culture, blood (routine x 2)     Status: None   Collection Time: 08/04/19  6:59 PM   Specimen: BLOOD LEFT HAND  Result Value Ref Range Status   Specimen Description BLOOD LEFT HAND  Final   Special Requests   Final    BOTTLES DRAWN AEROBIC ONLY Blood Culture results may not be optimal due to an inadequate volume of blood received in culture bottles   Culture   Final    NO GROWTH 5 DAYS Performed at [facility], [place]  [address], [place], [place] [address]    Report Status 08/09/2019 FINAL  Final  Body fluid culture     Status: None   Collection Time: 08/05/19 11:33 AM   Specimen: Body Fluid  Result Value Ref Range Status   Specimen Description FLUID ABDOMEN  Final   Special Requests NONE  Final   Gram Stain   Final    RARE WBC PRESENT,BOTH PMN AND MONONUCLEAR NO ORGANISMS SEEN    Culture   Final    NO GROWTH 3 DAYS Performed at [facility], [address] [place], [place] [address]    Report Status 08/08/2019 FINAL  Final  Culture, blood (routine x 2)     Status: None (Preliminary result)   Collection Time: 08/06/19  4:30 PM   Specimen: BLOOD  Result Value Ref Range Status   Specimen Description BLOOD LEFT ANTECUBITAL  Final    Special Requests AEROBIC BOTTLE ONLY Blood Culture adequate volume  Final   Culture   Final    NO GROWTH 3 DAYS Performed at [facility], [place] [address], [place], [place] [address]    Report Status PENDING  Incomplete  Culture, blood (routine x 2)     Status: None (Preliminary result)   Collection Time: 08/06/19  4:42 PM   Specimen: BLOOD LEFT HAND  Result Value Ref Range Status   Specimen Description BLOOD LEFT HAND  Final   Special Requests NONE  Final   Culture   Final    NO GROWTH 3 DAYS Performed at [facility], [place] [address], [place], [place] [address]    Report Status PENDING  Incomplete         Radiology Studies: No results found.      Scheduled Meds:  baclofen  10 mg Oral TID   bromocriptine  2.5 mg Oral BID   busPIRone  30 mg Oral TID   chlorhexidine  15 mL Mouth Rinse BID   Chlorhexidine Gluconate Cloth  6 each Topical Daily   famotidine  20 mg Per Tube BID   feeding supplement (PRO-STAT SUGAR FREE 64)  30 mL Per Tube TID   free water  150 mL Per Tube Q8H   levETIRAcetam  500 mg Per Tube BID   mouth rinse  15 mL Mouth Rinse q12n4p   metoprolol tartrate  50 mg Per Tube Q8H   sodium chloride flush  10-40 mL Intracatheter Q12H   Continuous Infusions:  sodium chloride 10 mL/hr at 08/01/19 1715   sodium chloride Stopped (08/08/19 1703)   feeding supplement (OSMOLITE 1.5 CAL) 1,000 mL (08/08/19 1714)   heparin 1,150 Units/hr (08/09/19 0800)     LOS: 21 days    Time spent: 35 mins.More than 50% of that time was spent in counseling and/or coordination of care.      Shelly Coss, MD [facility] Pager [phone]  If 7PM-7AM, please contact night-coverage [email or web address] Password [facility] 08/09/2019, 11:21 AM

## 2019-08-09 NOTE — Progress Notes (Signed)
  Speech Language Pathology Treatment: Dysphagia;Cognitive-Linquistic  Patient Details Name: Matthew Black MRN: [id number] DOB: 12-28-1975 Today's Date: 08/09/2019 Time: 1010-1035 SLP Time Calculation (min) (ACUTE ONLY): 25 min  Assessment / Plan / Recommendation Clinical Impression  Pt was seen at bedside to assess readiness for instrumental study and po intake. Pt was more alert today with notable right gaze preference. Pt was unable to turn head to the left, despite verbal, visual, and tactile cues. Pt was nonvocal and did not follow commands today. Oral care was completed with suction, which pt tolerated well. Following oral care, pt accepted trials of ice chips, thin liquid, and puree textures. Delayed cough noted after thin liquid trials. No cough following ice chips or puree. Pt is nutritionally supported with Cortrak, however, alertness and participation are improved so that pt is appropriate to proceed with MBS to determine baseline level of swallow function and identify least restrictive diet, if adequate alertness continues into the afternoon. MBS has been scheduled with radiology for 1330 this date. RN informed.    HPI HPI: Pt is a 43 y.o. with no known PMH who presents on 8/25 after being found down at work, nonverbal with L hemiparesis. 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 Stable trace midline shift. S/p hemicraniectomy with abdominal flap implant. Intubated 8/26-8/31. CXR worsened atelectasis in the right lower lobe.      SLP Plan  MBS;Continue with current plan of care       Recommendations  Diet recommendations: NPO;Other(comment)(pending MBS results/recommendations)                General recommendations: Rehab consult Oral Care Recommendations: Oral care QID Follow up Recommendations: 24 hour supervision/assistance;Inpatient Rehab SLP Visit Diagnosis: Dysphagia, unspecified (R13.10);Cognitive communication deficit  (R41.841) Plan: MBS;Continue with current plan of care       [place]. Quentin Ore [facility], CCC-SLP Speech Language Pathologist [phone]  Shonna Chock 08/09/2019, 10:44 AM

## 2019-08-09 NOTE — Progress Notes (Signed)
STROKE TEAM PROGRESS NOTE   INTERVAL HISTORY Patient is more alert and interactive today.  He is following commands consistently on the right side.  He remains afebrile.  He was evaluated by speech therapy today and is scheduled to undergo modified barium swallow this afternoon.  His wife is at the bedside.  She is concerned about his right facial and cheek swelling but this is likely positional as patient prefers to sleep on the right side of his face resting on the pillow  Vitals:   08/09/19 0900 08/09/19 1000 08/09/19 1129 08/09/19 1200  BP: 118/80 114/67  123/82  Pulse: 95 92  98  Resp: (!) 32 17  17  Temp:   97.8 F (36.6 C)   TempSrc:   Oral   SpO2: 100% 100%  100%  Weight:      Height:        CBC:  Recent Labs  Lab 08/08/19 0530 08/09/19 0213  WBC 11.2* 10.0  HGB 9.0* 10.0*  HCT 28.4* 30.4*  MCV 87.1 86.4  PLT 274 0000000    Basic Metabolic Panel:  Recent Labs  Lab 08/06/19 0635 08/07/19 0300 08/09/19 0213  NA 136 136 138  K 3.6 3.6 3.7  CL 103 106 105  CO2 23 24 25   GLUCOSE 134* 123* 112*  BUN 19 17 14   CREATININE 0.86 0.77 0.79  CALCIUM 8.4* 8.2* 8.5*  MG 2.0 2.1  --   PHOS 3.2 3.2  --     IMAGING Ct Head Wo Contrast Ct Cervical Spine Wo Contrast  07/19/2019 1132 1. 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 2. No associated hemorrhage and mild intracranial mass effect at this time, including trace leftward midline shift. 3. Evidence of large vessel occlusion: Hyperdensity of the right ICA terminus, the right A1 and MCA. 4. Unaffected brain parenchyma appears negative. 5.  No acute traumatic injury identified in the cervical spine.   Mr Brain 79 Contrast Mr Angio Head Wo Contrast Mr Angio Neck Wo Contrast 07/19/2019 1330 1. 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 The right ICA terminus, right ACA and MCA also appear occluded. 2. Large right hemisphere infarct mostly sparing the right PCA  territory. Cytotoxic edema but no convincing hemorrhage. Stable mild mass effect with trace leftward midline shift. 3. Intracranial MRA is degraded by motion artifact, but no other large vessel occlusion is suspected. There appears to be lobes reperfusion of the right PCA. 4. The left hemisphere and posterior fossa brain parenchyma appears normal.   Ct Head Wo Contrast 07/19/2019 1532 Similar appearance to earlier. Low-density and swelling of the right hemisphere in the ACA and MCA territories with mass effect and right-to-left shift of 5-6 mm. No hemorrhagic transformation at this time.   Ct Head Wo Contrast 07/20/2019 0444 1. Acute right ACA and MCA territory infarct with progressive swelling that has decompressed through the craniectomy defect. 2. No acute hemorrhage or new infarction.   Ct Angio Head W Or Wo Contrast Ct Angio Neck W Or Wo Contrast 07/21/2019 1. Stable from prior MRA. 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 No evidence of atherosclerosis or vasculopathy in the other vessels. 2. Cytotoxic edema causes 5 mm of midline shift and brain bulging through the craniectomy defect. Mild petechial hemorrhage is seen at the basal ganglia.   Ct Head Wo Contrast 07/23/2019 1. Unchanged appearance of massive right MCA  and ACA territory infarcts with parenchyma extending through decompressive craniectomy. 2. Small focus of suspected hemorrhage adjacent to the right caudate head.  Ct Head Wo Contrast 07/30/2019 1. Decreasing mass effect within large right anterior MCA and ACA territory infarct. 2. Midline shift is no longer present. There is decreased effacement of the right lateral ventricle. 3. Infarcted brain tissue still herniates through the craniectomy site. 4. New parenchymal hemorrhage within the infarcted tissue anteriorly measures 2.5 x 2.3 x 2.7 cm. 5. No new infarct.  Ct Head Wo Contrast 08/01/2019  1. Stable head CT since  07/30/2019. 2. Again noted is a large infarct involving the right MCA and right ACA territories with brain tissue herniating through the right craniotomy defect. 3. Parenchymal hemorrhage in the right anterior cortex has minimally changed. Stable petechial hemorrhage in the right basal ganglia region.   Dg Chest Port 1 View 08/04/2019 Well-positioned support structures. No active cardiopulmonary disease.  08/01/2019 No acute disease   Ct Angio Chest Pe W Or Wo Contrast 08/01/2019 Nonocclusive thrombus seen within the left posterior lower lobe segmental artery.    2D Echocardiogram 07/20/2019  1. The left ventricle has normal systolic function with an ejection fraction of 60-65%. The cavity size was normal. Left ventricular diastolic parameters were normal.  2. The right ventricle has normal systolic function. The cavity was normal. There is no increase in right ventricular wall thickness.  3. The pericardial effusion is circumferential.  4. Trivial pericardial effusion is present.  5. The mitral valve is grossly normal.  6. The tricuspid valve is grossly normal.  7. The aortic valve is tricuspid. No stenosis of the aortic valve.  8. The aorta is normal unless otherwise noted.  9. The aortic root is normal in size and structure. 10. No cardiac source of embolism identified. 11. When compared to the prior study: No comparison.  LE Dopplers 08/02/2019 Right: Findings consistent with acute deep vein thrombosis involving the right peroneal veins.  Left: Findings consistent with acute deep vein thrombosis involving the left common femoral vein, left femoral vein, left proximal profunda vein, left popliteal vein, left posterior tibial veins, and left peroneal veins. Extending up into left iliac vein  and IVC.  07/21/2019 Right: Findings consistent with acute deep vein thrombosis involving the right peroneal veins. Left: Findings consistent with acute deep vein thrombosis involving the left popliteal  vein, left posterior tibial veins, and left peroneal veins.  Vas [place] Transcranial Doppler W Bubbles 07/25/2019 No HITS heard heard at rest. No HITS heard heard during valsalva.   2D echo w/bubble 08/03/2019 Limited bubble study for shunt. No evidence for atrial level right to left shunt.  Ct Abdomen Pelvis W Contrast 08/04/2019 1. Large fluid collection in the lower right anterior abdominal wall subcutaneous fat with what appears to be an internal surgical drain, favored to represent a postoperative hematoma or proteinaceous seroma, however, the possibility of an abscess is not excluded in light of the patient's fever.  2. Small high attenuation fluid collection along the left pelvic sidewall which likely represents a hematoma, as above.  3. Additional incidental findings, as above.   PHYSICAL EXAM    General - Well nourished, well developed, middle-aged African-American male.  He has right hemicraniectomy surgical incision on the scalp.  Ophthalmologic - fundi not visualized due to noncooperation.  Cardiovascular - Regular rhythm, tachycardia.  Neuro - patient is wake,  . Nonverbal, but able to follow all simple commands on the right hand and foot. Eyes in  right gaze position, barely cross midline, not blinking to visual threat to the left, PERRL. Left facial droop.  Tongue protrusion not corporative.  Has spontaneous movement of RUE against gravity 4/5 at least and RLE in bed 2+/5 at least. On pain stimulation, mild withdraw of LLE, but no movement of LUE. DTR 1+ and no babinski. Sensation, coordination and gait not tested.   ASSESSMENT/PLAN Matthew Black is a 43 y.o. male with no significant past medical history found down x 2 days nonverbal with L hemiparesis.   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 Trace L midline shift. ELVO at R ICA, R A1, R MCA.  MRI  Large R brain infarct sparing R PCA. Cytotoxic edema but no hemorrhage. Stable trace midline shift.  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 R ICA terminus, R ACA, R MCA occluded.   CT head similar w/ low density and sweddling R ACA and MCA territories, now with 5-48mm midline shift. no hemorrhage  CT head acute R ACA and MCA infarct w/ progressive swelling decompressed through craniectomy defect.   CTA head and neck stable MLS. R ICA occlusion in neck that continues into R ACA and MCA. Mild petechial hemorrhage at basal ganglia  CT Head 8/29 - Unchanged appearance of massive right MCA and ACA territory infarcts with parenchyma extending through decompressive craniectomy. Small focus of suspected hemorrhage adjacent to the right caudate head.  CT Head WO - 07/30/19 - New hematoma within the infarcted tissue   CT repeat 08/01/2019 stable w/o change  2D Echo EF 60-65%  2D echo w/ bubble ordered no LRS   LE venous doppler DVT in R peroneal, L popliteal, L posterior tibial and L peroneal  LE venous doppler repeat DVT in R peroneal, L popliteal, L posterior tibial and L peroneal  CT chest Nonocclusive thrombus seen within the left posterior lower lobe segmental artery.   TCD bubble no HITS, no PFO  LDL 83  HgbA1c 5.4  UDS positive THC  Hypercoagulable and autoimmune work up negative  Heparin 1500 subq for VTE prophylaxis  No antithrombotic prior to admission, treated DVT with heparin IV, then  discontinued due to hemorrhagic conversion with right frontal hematoma, now resumed heparin IV given extensive DVT and PE  Therapy recommendations:  SNF  Disposition:  pending   Cyctotoxic cerebral edema  S/p R decompressive hemicraniectomy (Nundkumar) w/ flap R abd 07/19/2019  PICC placed - keep for now per Leonie Man  Given One dose of 23.4% 8/26; 3%  off 8/30 1700  Na 146 -> 140->135->136  On  NS to 75cc and decrease free water to 150 Q8  Monitor Na daily  CT repeat 07/30/19 - New parenchymal hemorrhage within the infarcted tissue  Off helmet to further release pressure  CT repeat 08/01/2019 stable w/o change  Close neuro check since on heparin IV  Right ICA occlusion  MRA and CTA showed right ICA occlusion from origin to terminal  Wife denies any head trauma  Wife stated that pt had recent aggressive exercise with weight lifting  Concerning dissection as working diagnosis of stroke etiology   B LE DVT Small LLL PE  LE venous doppler DVT in R peroneal, L popliteal, L posterior tibial and L peroneal  Etiology unclear  Started IV heparin 07/26/2019  IVC filter placed 8/27. Plan retrieval in 8-12 weeks as an IP in an IR clinic  Hypercoagulable work up negative   Off SCDs  LE venous doppler 08/02/19 repeat extensive DVT in R peroneal, L popliteal, L posterior tibial and L peroneal up to the IVC filter  CT chest Nonocclusive thrombus seen within the left posterior lower lobe segmental artery.   hydrated w/ IVF @ 75cc, continue free water and TF  Treated with IV Heparin but then off secondary to intracranial hemorrhage  Restarted IV heparin given extensive DVT and PE with close neuro monitoring. Pt remains stable.  Hematology consult, appreciate help - hypercoag w/u neg.  At risk for recurrent clots. AC recommended w/ lovenox at d/c.  Factor V Leiden, homocysteine, Prot C&S, Prothombin gene mutation  All neg. JAK2 pending from 08/04/19  Appreciate TRH assistance on this complicated case  Abdominal wall hematoma  Large fluid collection seen on CT abdomen  CT abd/pel concerning for abdominal wall fluid collection likely old hematoma  Dr. Kathyrn Sheriff did fluid aspiration and seems to be old blood component  Fluid aspiration sent for culture 9/11 - no growth < 24 hrs  requested ID consult   Seizure-like activity  Continue Keppra   EEG continuous slowing,  excessive beta activity, sleep spindle asymmetry decreased R->related to stroke and sedation. No SZ  Long Term EEG cortical dysfunction in right frontal region  Seizure precautions  SIRS/fever/tachycardia/tachypnea/leukocytosis - central neurogenic fever  Tmax - 101.8->99.7->101.8->101.5->99.6->98.3-100.8-102.1-103.9->10.3.->101.4  WBC - 11.8->14.5->16.1->13.5->17.5->16.4-13.6-12.7->13.1->15.1->11.2->10.0  CXR x 2 NAD  U/A x 3 - negative. 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 Secondary to stroke . NPO  Cortrak w/ TF @ 55cc  Decreased free water 150cc q4->q8   Speech on board   May need PEG, wife want to wait for a new more days  Other Stroke Risk Factors  Former Cigarette smoker, quit 3 yrs ago  ETOH use  Substance abuse UDS - positive for THC   Obesity, Body mass index is 31.89 kg/m., recommend weight loss, diet and exercise as appropriate   Other Active Problems  Elevated CK/Elevated liver enzymes -  AST - 132->219->97->67 ; ALT - 68->244->174->116->80 - improving; Hepatitis panel 9/12 - neg  Hypokalemia - 3.5->3.4 - supplement ->3.5- 3.2->3.4 - replaced - 4.5->4.2->3.9->3.6-3.7  Normocytic anemia - hematology felt d/t ongoing blood draws 11.9-11.2-10.3->9.4->934-9.7-9.0-10.0  Hospital day # 21  Check swallow eval by speech therapy with modified barium  today.  Mobilize out of bed therapy consults.  Will consult rehab MD to reevaluate for inpatient rehab and if still refuse may need social work for skilled nursing facility.  Long discussion with the wife at the bedside.  She clearly prefers rehab in an inpatient setting if possible.  Plan to  transfer out of ICU to neurology floor bed I have spent a total of  35  minutes with the patient reviewing hospital notes,  test results, labs and examining the patient as well as establishing an assessment and plan that was discussed personally with the patient.  > 50% of time was spent in direct patient care.       Antony Contras, MD  To contact Stroke Continuity provider, please refer to [email or web address] After hours, contact General Neurology

## 2019-08-09 NOTE — Progress Notes (Signed)
ANTICOAGULATION CONSULT NOTE  Pharmacy Consult:  Heparin Indication:  DVT/PE  Allergies  Allergen Reactions  . Hydrocodone Nausea Only    Patient Measurements: Height: 5\' 11"  (180.3 cm) Weight: 228 lb 9.9 oz (103.7 kg) IBW/kg (Calculated) : 75.3 Heparin Dosing Weight: 96 kg  Vital Signs: Temp: 98.4 F (36.9 C) (09/15 0700) Temp Source: Oral (09/15 0700) BP: 120/77 (09/15 0800) Pulse Rate: 87 (09/15 0800)  Labs: Recent Labs    08/07/19 0300  08/08/19 0530 08/08/19 0857 08/08/19 1115 08/08/19 2003 08/09/19 0213  HGB 9.7*  --  9.0*  --   --   --  10.0*  HCT 29.9*  --  28.4*  --   --   --  30.4*  PLT 282  --  274  --   --   --  278  HEPARINUNFRC  --    < > 0.39  --  0.22* 0.37 0.27*  CREATININE 0.77  --   --   --   --   --  0.79  CKTOTAL 3,198*  --   --  3,012*  --   --  2,063*   < > = values in this interval not displayed.    Estimated Creatinine Clearance: 149 mL/min (by C-G formula based on SCr of 0.79 mg/dL).   Assessment: 49 YOM presented with acute ischemic CVA and did not receive tPA.  There was an incidental finding of DVT and IVC filter was placed on 07/21/19 as patient was not a candidate for [facility] due to craniectomy.  He was cleared to start IV heparin, but then stopped on 07/30/19 due to right frontal lobe hemorrhage.  CTA on 08/01/19 showed non-occlusive thrombus and repeat Doppler on 08/02/19 positive for bilateral DVTs.  Heparin restarted on 08/02/19 given extensive DVTs and PE.    Hep lvl this am was slightly low at 0.27  Previously therapeutic x 2 at current rate  Goal of Therapy:  Heparin level 0.3-0.5 units/ml Monitor platelets by anticoagulation protocol: Yes    Plan:   Increase heparin to 1250 units/hr Recheck hep lvl 1600 Daily Hep lvl cbc Monitor for s/sx of bleeding  Levester Fresh, PharmD, BCPS, BCCCP Clinical Pharmacist [phone]  Please check AMION for all [place] numbers  08/09/2019 8:42 AM

## 2019-08-10 LAB — CBC
HCT: 29.4 % — ABNORMAL LOW (ref 39.0–52.0)
Hemoglobin: 10 g/dL — ABNORMAL LOW (ref 13.0–17.0)
MCH: 28.8 pg (ref 26.0–34.0)
MCHC: 34 g/dL (ref 30.0–36.0)
MCV: 84.7 fL (ref 80.0–100.0)
Platelets: 282 10*3/uL (ref 150–400)
RBC: 3.47 MIL/uL — ABNORMAL LOW (ref 4.22–5.81)
RDW: 13.8 % (ref 11.5–15.5)
WBC: 10.7 10*3/uL — ABNORMAL HIGH (ref 4.0–10.5)
nRBC: 0 % (ref 0.0–0.2)

## 2019-08-10 LAB — JAK2 GENOTYPR

## 2019-08-10 LAB — CK: Total CK: 852 U/L — ABNORMAL HIGH (ref 49–397)

## 2019-08-10 LAB — HEPARIN LEVEL (UNFRACTIONATED): Heparin Unfractionated: 0.34 IU/mL (ref 0.30–0.70)

## 2019-08-10 LAB — GLUCOSE, CAPILLARY
Glucose-Capillary: 100 mg/dL — ABNORMAL HIGH (ref 70–99)
Glucose-Capillary: 102 mg/dL — ABNORMAL HIGH (ref 70–99)
Glucose-Capillary: 107 mg/dL — ABNORMAL HIGH (ref 70–99)
Glucose-Capillary: 112 mg/dL — ABNORMAL HIGH (ref 70–99)
Glucose-Capillary: 117 mg/dL — ABNORMAL HIGH (ref 70–99)
Glucose-Capillary: 94 mg/dL (ref 70–99)

## 2019-08-10 LAB — PROTHROMBIN GENE MUTATION

## 2019-08-10 MED ORDER — ENSURE ENLIVE PO LIQD
237.0000 mL | Freq: Two times a day (BID) | ORAL | Status: DC
  Administered 2019-08-10 – 2019-08-14 (×9): 237 mL via ORAL

## 2019-08-10 MED ORDER — OSMOLITE 1.5 CAL PO LIQD
1000.0000 mL | ORAL | Status: DC
  Administered 2019-08-10 – 2019-08-14 (×5): 1000 mL
  Filled 2019-08-10 (×10): qty 1000

## 2019-08-10 NOTE — Progress Notes (Signed)
Inpatient Rehab Admissions Coordinator:   Met with pt and his wife per request of Dr. Leonie Man.  Chart reviewed and spoke with nurse, as well.  Note pt currently requiring use of maxisky for bed<>chair transfers, lethargic, intermittently following commands, and progressed to D1 diet.  Discussed expectations of CIR stay with wife, and noted that pt continues to be too low level for our short term program.  Even if pt were to have 4 weeks, he would most likely still require significant care.  Continue to recommend SNF for rehab at this time. Will sign off.   Shann Medal, PT, DPT Admissions Coordinator [phone] 08/10/19  2:42 PM

## 2019-08-10 NOTE — Progress Notes (Addendum)
PROGRESS NOTE    BRAINARD REGES  [id number] DOB: 07-Mar-1976 DOA: 07/19/2019 PCP: Colon Branch, MD   Brief Narrative:  Mia Seegars Tuckeris an 43 y.o.malewithout significant known PMH who presented on 8/25 with AMS as well as L hemiparesis and aphasia. UDS + for THC. CT with large R hemispheric infarct with confluent cytotoxic edema in R ACA and MCA territories and mild mass effect. 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 He was admitted to [facility] with neurology consultation and started on 3% saline. Neurosurgery was consulted and Dr. Kathyrn Sheriff recommended early decompressive hemicraniectomy. He subsequently developed hemorrhagic conversion with hematoma on 9/5; helmet was removed to further release intracranial pressure and heparin was discontinued. LE doppler also indicated DVT in R peroneal, L popliteal, L posterior tibial, and L peroneal veins; IVC filter was placed. Patient had seizure-like activity, for which he is on Keppra. He was subsequently found to have possible RLL infiltrate and with concern for aspiration he was made NPO and started on Unasyn. CTA was performed and it showed a non-occlusive LLL thrombus on 9/7.Subsequent repeat DVT US was performed 9/8 and showed acute R DVT of the peroneal veins as well as acute L DVT involving common femoral vein, L femoral vein, L proximal profunda vein, L popliteal vein, L posterior tibial veins, and L peroneal veins - and it extends up into the left iliac vein and IVC.  CT abdomen showed large fluid collection in lower right anterior abdominal which could be hematoma, seroma or an abscess.  Neurosurgery consulted and tapped the abdominal hematoma. Hospital course remarkable for persistent fever, Cultures negative so far.  Antibiotics have been stopped .  Assessment & Plan:   Active Problems:   Stroke (cerebrum) (HCC)   Pressure injury of skin   Acute respiratory failure (HCC)   Endotracheal tube present   Deep  vein thrombosis (DVT) of non-extremity vein   Hypokalemia   FUO (fever of unknown origin)   Acute blood loss anemia   SIRS (systemic inflammatory response syndrome) (HCC)   Leukocytosis   Primary hypercoagulable state ([place])   Acute pulmonary embolism without acute cor pulmonale (HCC)   Right MCA/ACA infarct with left hemiparesis: Patient is still encephalopathic but mental status slowly improving.  Acute  encephalopathy due to massive CVA.  Has right ICA, MCA and ACA occlusions.  Status post hemicraniectomy with abdominal flap implant.  Hemorrhagic conversion of CVA when he was started on heparin for DVT.  Heparin has been resumed.  Neurology/neurosurgery managing.  Bilateral lower extremity DVT/nonocclusive left lower lobe PE: Currently on heparin.  Also has IVC filter.  Stable abdominal wall hematoma under the abdominal flap incision site.  Has edema of the left lower extremity.  Seizure-like activity: On Keppra as per neurology.  Persistent Fever:Tmax of 100.5 today.[place] Kitchen  Extensive work-up did not reveal any clear source of infection.  Suspected to be  associated  with hemorrhagic CVA. Antibiotics have been stopped.  Chills resolved with bromocriptine.  Abdominal hematoma: CT abdomen showed large fluid collection in lower right anterior abdominal which could be hematoma, seroma or an abscess.  Neurosurgery consulted and tapped the abdominal hematoma.  Cultures negative so far.  Elevated CK/Elevated liver enzymes:Found on the floor. CK elevated  due to rhabdomyolysis and is improving with IV fluids.  Also has mild elevated liver enzymes.  Hepatitis panel negative  Normocytic anemia: Currently H&H stable.  Dysphagia: Encephalopathy due to massive CVA.  On feeding tube.Speech following ,underwent MBS.Started  on dysphagia 1 diet. I think we can remove the feeding tube now.  Nutrition Problem: Inadequate oral intake Etiology: acute illness      DVT prophylaxis: Heparin IV Code Status:  Full Family Communication: None present at the bedside Disposition Plan: As per neurology.Likely SNF/CIR    Procedures: Hemicraniectomy  Antimicrobials:  Anti-infectives (From admission, onward)   Start     Dose/Rate Route Frequency Ordered Stop   08/05/19 1000  vancomycin (VANCOCIN) 1,250 mg in sodium chloride 0.9 % 250 mL IVPB  Status:  Discontinued     1,250 mg 166.7 mL/hr over 90 Minutes Intravenous Every 12 hours 08/04/19 1856 08/08/19 0914   08/05/19 0600  ceFEPIme (MAXIPIME) 2 g in sodium chloride 0.9 % 100 mL IVPB  Status:  Discontinued     2 g 200 mL/hr over 30 Minutes Intravenous Every 8 hours 08/04/19 1856 08/08/19 0914   08/04/19 2200  metroNIDAZOLE (FLAGYL) IVPB 500 mg  Status:  Discontinued     500 mg 100 mL/hr over 60 Minutes Intravenous Every 8 hours 08/04/19 1743 08/06/19 1548   08/04/19 1830  vancomycin (VANCOCIN) 2,000 mg in sodium chloride 0.9 % 500 mL IVPB     2,000 mg 250 mL/hr over 120 Minutes Intravenous  Once 08/04/19 1805 08/04/19 2053   08/04/19 1815  ceFEPIme (MAXIPIME) 2 g in sodium chloride 0.9 % 100 mL IVPB     2 g 200 mL/hr over 30 Minutes Intravenous  Once 08/04/19 1805 08/04/19 1842   07/31/19 2200  Ampicillin-Sulbactam (UNASYN) 3 g in sodium chloride 0.9 % 100 mL IVPB  Status:  Discontinued     3 g 200 mL/hr over 30 Minutes Intravenous Every 6 hours 07/31/19 1401 08/04/19 1743   07/31/19 1415  Ampicillin-Sulbactam (UNASYN) 3 g in sodium chloride 0.9 % 100 mL IVPB     3 g 200 mL/hr over 30 Minutes Intravenous STAT 07/31/19 1401 07/31/19 1551   07/19/19 2315  bacitracin 50,000 Units in sodium chloride 0.9 % 500 mL irrigation  Status:  Discontinued       As needed 07/19/19 2315 07/19/19 2339      Subjective:  Patient seen and examined the bedside this morning.  Hemodynamically stable.  He has been started on dysphagia 1 diet.  Continues to remain encephalopathic.  Awake but not much alert or oriented.  Did not follow any commands to me again  today  Objective: Vitals:   08/10/19 0939 08/10/19 1000 08/10/19 1100 08/10/19 1105  BP: (!) 128/92 (!) 130/92 (!) 114/96   Pulse: 94 91 93   Resp: 17 14 17    Temp:    (!) 100.4 F (38 C)  TempSrc:    Axillary  SpO2: 99% 99% 100%   Weight:      Height:        Intake/Output Summary (Last 24 hours) at 08/10/2019 1125 Last data filed at 08/10/2019 1100 Gross per 24 hour  Intake 3256.69 ml  Output 5100 ml  Net -1843.31 ml   Filed Weights   08/06/19 0500 08/09/19 0500 08/10/19 0500  Weight: 103.6 kg 103.7 kg 106.5 kg    Examination:  General exam: Not in any kind of distress, encephalopathic HEENT: Eyes open,Oral mucosa moist, Ear/Nose normal on gross exam, feeding tube, staples on the scalp Respiratory system: Bilateral equal air entry, normal vesicular breath sounds, no wheezes or crackles  Cardiovascular system: S1 & S2 heard, RRR. No JVD, murmurs, rubs, gallops or clicks. Gastrointestinal system: Abdomen is nondistended, soft and nontender.  No organomegaly or masses felt. Normal bowel sounds heard.  Staples on the right lower quadrant Central nervous system: Awake, not alert or oriented.  Does not follow commands  extremities: Left lower extremity edema Skin: No rashes, lesions or ulcers,no icterus ,no pallor   Data Reviewed: I have personally reviewed following labs and imaging studies  CBC: Recent Labs  Lab 08/06/19 0635 08/07/19 0300 08/08/19 0530 08/09/19 0213 08/10/19 0618  WBC 13.1* 15.1* 11.2* 10.0 10.7*  HGB 9.4* 9.7* 9.0* 10.0* 10.0*  HCT 29.4* 29.9* 28.4* 30.4* 29.4*  MCV 88.0 86.7 87.1 86.4 84.7  PLT 221 282 274 278 [id number]   Basic Metabolic Panel: Recent Labs  Lab 08/04/19 0440 08/05/19 0457 08/06/19 0635 08/07/19 0300 08/09/19 0213  NA 135  --  136 136 138  K 3.9  --  3.6 3.6 3.7  CL 103  --  103 106 105  CO2 22  --  23 24 25   GLUCOSE 130*  --  134* 123* 112*  BUN 23*  --  19 17 14   CREATININE 0.89  --  0.86 0.77 0.79  CALCIUM 8.4*  --   8.4* 8.2* 8.5*  MG 2.1 2.1 2.0 2.1  --   PHOS 3.5 3.7 3.2 3.2  --    GFR: Estimated Creatinine Clearance: 150.9 mL/min (by C-G formula based on SCr of 0.79 mg/dL). Liver Function Tests: Recent Labs  Lab 08/04/19 0440 08/06/19 0635 08/07/19 0300 08/09/19 0213  AST 67* 105* 118* 98*  ALT 80* 68* 60* 65*  ALKPHOS 94 85 79 74  BILITOT 0.7 0.6 1.2 0.6  PROT 7.2 7.3 6.5 6.6  ALBUMIN 2.8* 2.7* 2.6* 2.5*   No results for input(s): LIPASE, AMYLASE in the last 168 hours. No results for input(s): AMMONIA in the last 168 hours. Coagulation Profile: No results for input(s): INR, PROTIME in the last 168 hours. Cardiac Enzymes: Recent Labs  Lab 08/06/19 1203 08/07/19 0300 08/08/19 0857 08/09/19 0213 08/10/19 0618  CKTOTAL 2,594* 3,198* 3,012* 2,063* 852*   BNP (last 3 results) No results for input(s): PROBNP in the last 8760 hours. HbA1C: No results for input(s): HGBA1C in the last 72 hours. CBG: Recent Labs  Lab 08/09/19 1954 08/10/19 0055 08/10/19 0501 08/10/19 0705 08/10/19 1105  GLUCAP 100* 102* 94 107* 112*   Lipid Profile: No results for input(s): CHOL, HDL, LDLCALC, TRIG, CHOLHDL, LDLDIRECT in the last 72 hours. Thyroid Function Tests: No results for input(s): TSH, T4TOTAL, FREET4, T3FREE, THYROIDAB in the last 72 hours. Anemia Panel: No results for input(s): VITAMINB12, FOLATE, FERRITIN, TIBC, IRON, RETICCTPCT in the last 72 hours. Sepsis Labs: No results for input(s): PROCALCITON, LATICACIDVEN in the last 168 hours.  Recent Results (from the past 240 hour(s))  Culture, blood (Routine X 2) w Reflex to ID Panel     Status: None   Collection Time: 08/01/19  4:50 AM   Specimen: BLOOD  Result Value Ref Range Status   Specimen Description BLOOD PICC LINE  Final   Special Requests   Final    BOTTLES DRAWN AEROBIC ONLY Blood Culture adequate volume   Culture   Final    NO GROWTH 5 DAYS Performed at [facility], [address], [place], [place] [address]     Report Status 08/06/2019 FINAL  Final  Culture, blood (Routine X 2) w Reflex to ID Panel     Status: None   Collection Time: 08/01/19  4:50 AM   Specimen: BLOOD RIGHT HAND  Result Value  Ref Range Status   Specimen Description BLOOD RIGHT HAND  Final   Special Requests   Final    BOTTLES DRAWN AEROBIC ONLY Blood Culture adequate volume   Culture   Final    NO GROWTH 5 DAYS Performed at [facility], [address] [place], [place] [address]    Report Status 08/06/2019 FINAL  Final  Culture, Urine     Status: None   Collection Time: 08/04/19 12:48 PM   Specimen: Urine, Random  Result Value Ref Range Status   Specimen Description URINE, RANDOM  Final   Special Requests NONE  Final   Culture   Final    NO GROWTH Performed at [facility], [place] [address], [place], [place] [address]    Report Status 08/05/2019 FINAL  Final  Culture, blood (routine x 2)     Status: None   Collection Time: 08/04/19  6:59 PM   Specimen: BLOOD LEFT ARM  Result Value Ref Range Status   Specimen Description BLOOD LEFT ARM  Final   Special Requests   Final    BOTTLES DRAWN AEROBIC ONLY Blood Culture results may not be optimal due to an inadequate volume of blood received in culture bottles   Culture   Final    NO GROWTH 5 DAYS Performed at [facility], [place] [address], [place], [place] [address]    Report Status 08/09/2019 FINAL  Final  Culture, blood (routine x 2)     Status: None   Collection Time: 08/04/19  6:59 PM   Specimen: BLOOD LEFT HAND  Result Value Ref Range Status   Specimen Description BLOOD LEFT HAND  Final   Special Requests   Final    BOTTLES DRAWN AEROBIC ONLY Blood Culture results may not be optimal due to an inadequate volume of blood received in culture bottles   Culture   Final    NO GROWTH 5 DAYS Performed at [facility], [place] [address] [place], [place] [address]    Report Status 08/09/2019 FINAL  Final  Body fluid culture     Status: None    Collection Time: 08/05/19 11:33 AM   Specimen: Body Fluid  Result Value Ref Range Status   Specimen Description FLUID ABDOMEN  Final   Special Requests NONE  Final   Gram Stain   Final    RARE WBC PRESENT,BOTH PMN AND MONONUCLEAR NO ORGANISMS SEEN    Culture   Final    NO GROWTH 3 DAYS Performed at [facility], [address], [place], [place] [address]    Report Status 08/08/2019 FINAL  Final  Culture, blood (routine x 2)     Status: None (Preliminary result)   Collection Time: 08/06/19  4:30 PM   Specimen: BLOOD  Result Value Ref Range Status   Specimen Description BLOOD LEFT ANTECUBITAL  Final   Special Requests AEROBIC BOTTLE ONLY Blood Culture adequate volume  Final   Culture   Final    NO GROWTH 4 DAYS Performed at [facility], [place] [address] [place], [place] [address]    Report Status PENDING  Incomplete  Culture, blood (routine x 2)     Status: None (Preliminary result)   Collection Time: 08/06/19  4:42 PM   Specimen: BLOOD LEFT HAND  Result Value Ref Range Status   Specimen Description BLOOD LEFT HAND  Final   Special Requests NONE  Final   Culture   Final  NO GROWTH 4 DAYS Performed at [facility], [place] [address], [place], [place] [address]    Report Status PENDING  Incomplete         Radiology Studies: Dg Swallowing Func-speech Pathology  Result Date: 08/09/2019 Objective Swallowing Evaluation: Type of Study: MBS-Modified Barium Swallow Study  Patient Details Name: TAITON ARCIERO MRN: [id number] Date of Birth: 1976/07/13 Today's Date: 08/09/2019 Time: SLP Start Time (ACUTE ONLY): 1400 -SLP Stop Time (ACUTE ONLY): 1420 SLP Time Calculation (min) (ACUTE ONLY): 20 min Past Medical History: Past Medical History: Diagnosis Date  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 Past Surgical History: Past Surgical History: Procedure Laterality Date  CRANIOTOMY Right 07/19/2019  Procedure: RIGHT HEMI-CRANIECTOMY With implantation of skull flap to abdominal wall;   Surgeon: Consuella Lose, MD;  Location: [place];  Service: Neurosurgery;  Laterality: Right;  CYST EXCISION N/A 10/08/2016  Procedure: EXCISION OF POSTERIOR NECK CYST;  Surgeon: Clovis Riley, MD;  Location: [facility];  Service: General;  Laterality: N/A;  INCISION AND DRAINAGE ABSCESS N/A 09/22/2014  Procedure: INCISION AND DRAINAGE ABSCESS POSTERIOR NECK;  Surgeon: Pedro Earls, MD;  Location: [facility];  Service: General;  Laterality: N/A;  INCISION AND DRAINAGE ABSCESS N/A 12/20/2015  Procedure: INCISION AND DRAINAGE POSTERIOR NECK MASS;  Surgeon: Armandina Gemma, MD;  Location: [facility];  Service: General;  Laterality: N/A;  INCISION AND DRAINAGE ABSCESS Left 07/10/2004  middle finger  IR IVC FILTER PLMT / S&I /IMG GUID/MOD SED  07/21/2019  IR VENOGRAM RENAL UNI RIGHT  07/21/2019  MASS EXCISION N/A 07/21/2017  Procedure: EXCISION OF BENIGN NECK LESION WITH LAYERED CLOSURE;  Surgeon: Irene Limbo, MD;  Location: [facility];  Service: Plastics;  Laterality: N/A;  MASS EXCISION N/A 11/10/2017  Procedure: EXCISION BENIGN LESION OF THE NECK WITH LAYERED CLOSURE;  Surgeon: Irene Limbo, MD;  Location: [place];  Service: Plastics;  Laterality: N/A; HPI: Pt is a 43 y.o. with no known PMH who presents on 8/25 after being found down at work, nonverbal with L hemiparesis. 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 Stable trace midline shift. S/p hemicraniectomy with abdominal flap implant. Intubated 8/26-8/31.  Subjective: Pt seen in radiology for MBS Assessment / Plan / Recommendation CHL IP CLINICAL IMPRESSIONS 08/09/2019 Clinical Impression Pt presents with surprisingly safe swallow function, given severity of deficits. Pt's primary deficit was oral prep of solid texture, which he was unable to demonstrate. However, pt did chew up the barium tablet and swallow it. Pt tolerated trials of thin liquid, nectar thick liquid, and puree  consistencies with adequate oral prep and propulsion. Swallow reflex triggered at the vallecula on nectar thick liquids and puree, and at the vallecula or pyriform sinus on thin liquids. Pt exhibited very trace flash penetration x1 on large consecutive boluses of thin liquid via straw, despite being challenged on multiple occasions. No post-swallow residue was noted in the vallecula or pyriform sinuses. Recommend beginning puree diet with thin liquids, crushed meds. SLP will follow acutely to assess diet tolerance and provide education, as well as to provide trials of advanced textures (solids). Safe swallow precautions were sent with pt back to his room. Results and recommendations discussed with RN. SLP Visit Diagnosis Dysphagia, oropharyngeal phase (R13.12) Impact on safety and function Mild aspiration risk   CHL IP TREATMENT RECOMMENDATION 08/09/2019 Treatment Recommendations Therapy as outlined in treatment plan below   Prognosis 08/09/2019 Prognosis for Safe Diet Advancement Good Barriers to Reach  Goals Severity of deficits Barriers/Prognosis Comment -- CHL IP DIET RECOMMENDATION 08/09/2019 SLP Diet Recommendations Dysphagia 1 (Puree) solids;Thin liquid Liquid Administration via Straw Medication Administration Crushed with puree Compensations Minimize environmental distractions;Slow rate;Small sips/bites Postural Changes Seated upright at 90 degrees   CHL IP OTHER RECOMMENDATIONS 08/09/2019   Oral Care Recommendations Oral care QID     CHL IP FOLLOW UP RECOMMENDATIONS 08/09/2019 Follow up Recommendations 24 hour supervision/assistance;Inpatient Rehab   CHL IP FREQUENCY AND DURATION 08/09/2019 Speech Therapy Frequency (ACUTE ONLY) min 2x/week Treatment Duration 2 weeks      CHL IP ORAL PHASE 08/09/2019 Oral Phase Impaired Oral - Nectar Straw Premature spillage Oral - Thin Straw Premature spillage Oral - Puree Premature spillage Oral - Mech Soft Weak lingual manipulation;Impaired mastication;Holding of bolus Oral -  Pill Holding of bolus    CHL IP PHARYNGEAL PHASE 08/09/2019 Pharyngeal Phase Impaired Pharyngeal- Nectar Straw Delayed swallow initiation-vallecula Pharyngeal- Thin Straw Delayed swallow initiation-pyriform sinuses;Delayed swallow initiation-vallecula;Penetration/Aspiration during swallow Pharyngeal Material does not enter airway;Material enters airway, remains ABOVE vocal cords then ejected out Pharyngeal- Puree Delayed swallow initiation-vallecula   Pharyngeal- Mechanical Soft Solid texture was removed from pt oral cavity and was not swallowed. Pharyngeal- Pill Pt chewed barium tablet and swallowed it with sips of liquid    CHL IP CERVICAL ESOPHAGEAL PHASE 08/09/2019 Cervical Esophageal Phase [facility] Celia B. Quentin Ore, [facility], CCC-SLP Speech Language Pathologist [phone] Shonna Chock 08/09/2019, 3:12 PM                   Scheduled Meds:  baclofen  10 mg Oral TID   bromocriptine  2.5 mg Oral BID   busPIRone  30 mg Oral TID   chlorhexidine  15 mL Mouth Rinse BID   Chlorhexidine Gluconate Cloth  6 each Topical Daily   famotidine  20 mg Per Tube BID   feeding supplement (PRO-STAT SUGAR FREE 64)  30 mL Per Tube TID   free water  150 mL Per Tube Q8H   levETIRAcetam  500 mg Per Tube BID   mouth rinse  15 mL Mouth Rinse q12n4p   metoprolol tartrate  50 mg Per Tube Q8H   sodium chloride flush  10-40 mL Intracatheter Q12H   Continuous Infusions:  sodium chloride 10 mL/hr at 08/01/19 1715   sodium chloride 100 mL/hr at 08/10/19 1100   feeding supplement (OSMOLITE 1.5 CAL) 1,000 mL (08/09/19 1312)   heparin 1,250 Units/hr (08/10/19 1100)     LOS: 22 days    Time spent: 35 mins.    Shelly Coss, MD [facility] Pager [phone]  If 7PM-7AM, please contact night-coverage [email or web address] Password [facility] 08/10/2019, 11:25 AM

## 2019-08-10 NOTE — Progress Notes (Signed)
STROKE TEAM PROGRESS NOTE   INTERVAL HISTORY Patient is lying in bed.  He is awake and follows simple commands on the right.  Continues to have low-grade fever.  He did pass a swallow test and is has been eating but will get tube feeds at night his wife is not available at the bedside today during rounds  Vitals:   08/11/19 1000 08/11/19 1100 08/11/19 1200 08/11/19 1300  BP: 120/76   116/80  Pulse: 90 88 92 98  Resp: (!) 25 20 19 18   Temp:      TempSrc:      SpO2: 100% 100% 100% 100%  Weight:      Height:        CBC:  Recent Labs  Lab 08/09/19 0213 08/10/19 0618  WBC 10.0 10.7*  HGB 10.0* 10.0*  HCT 30.4* 29.4*  MCV 86.4 84.7  PLT 278 [id number]    Basic Metabolic Panel:  Recent Labs  Lab 08/06/19 0635 08/07/19 0300 08/09/19 0213  NA 136 136 138  K 3.6 3.6 3.7  CL 103 106 105  CO2 23 24 25   GLUCOSE 134* 123* 112*  BUN 19 17 14   CREATININE 0.86 0.77 0.79  CALCIUM 8.4* 8.2* 8.5*  MG 2.0 2.1  --   PHOS 3.2 3.2  --     IMAGING Ct Head Wo Contrast Ct Cervical Spine Wo Contrast  07/19/2019 1132 1. 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 2. No associated hemorrhage and mild intracranial mass effect at this time, including trace leftward midline shift. 3. Evidence of large vessel occlusion: Hyperdensity of the right ICA terminus, the right A1 and MCA. 4. Unaffected brain parenchyma appears negative. 5.  No acute traumatic injury identified in the cervical spine.   Mr Brain 57 Contrast Mr Angio Head Wo Contrast Mr Angio Neck Wo Contrast 07/19/2019 1330 1. 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 The right ICA terminus, right ACA and MCA also appear occluded. 2. 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 Cytotoxic edema but no convincing hemorrhage. Stable mild mass effect with trace leftward midline shift. 3. Intracranial MRA is degraded by motion artifact, but no other large vessel  occlusion is suspected. There appears to be lobes reperfusion of the right PCA. 4. The left hemisphere and posterior fossa brain parenchyma appears normal.   Ct Head Wo Contrast 07/19/2019 1532 Similar appearance to earlier. Low-density and swelling of the right hemisphere in the ACA and MCA territories with mass effect and right-to-left shift of 5-6 mm. No hemorrhagic transformation at this time.   Ct Head Wo Contrast 07/20/2019 0444 1. Acute right ACA and MCA territory infarct with progressive swelling that has decompressed through the craniectomy defect. 2. No acute hemorrhage or new infarction.   Ct Angio Head W Or Wo Contrast Ct Angio Neck W Or Wo Contrast 07/21/2019 1. Stable from prior MRA. 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 No evidence of atherosclerosis or vasculopathy in the other vessels. 2. Cytotoxic edema causes 5 mm of midline shift and brain bulging through the craniectomy defect. Mild petechial hemorrhage is seen at the basal ganglia.   Ct Head Wo Contrast 07/23/2019 1. Unchanged appearance of massive right MCA and ACA territory infarcts with parenchyma extending through decompressive craniectomy. 2. Small focus of suspected hemorrhage adjacent to the right caudate head.  Ct Head Wo Contrast 07/30/2019 1.  Decreasing mass effect within large right anterior MCA and ACA territory infarct. 2. Midline shift is no longer present. There is decreased effacement of the right lateral ventricle. 3. Infarcted brain tissue still herniates through the craniectomy site. 4. New parenchymal hemorrhage within the infarcted tissue anteriorly measures 2.5 x 2.3 x 2.7 cm. 5. No new infarct.  Ct Head Wo Contrast 08/01/2019  1. Stable head CT since 07/30/2019. 2. Again noted is a large infarct involving the right MCA and right ACA territories with brain tissue herniating through the right craniotomy defect. 3. Parenchymal hemorrhage in the right  anterior cortex has minimally changed. Stable petechial hemorrhage in the right basal ganglia region.   Dg Chest Port 1 View 08/04/2019 Well-positioned support structures. No active cardiopulmonary disease.  08/01/2019 No acute disease   Ct Angio Chest Pe W Or Wo Contrast 08/01/2019 Nonocclusive thrombus seen within the left posterior lower lobe segmental artery.    2D Echocardiogram 07/20/2019  1. The left ventricle has normal systolic function with an ejection fraction of 60-65%. The cavity size was normal. Left ventricular diastolic parameters were normal.  2. The right ventricle has normal systolic function. The cavity was normal. There is no increase in right ventricular wall thickness.  3. The pericardial effusion is circumferential.  4. Trivial pericardial effusion is present.  5. The mitral valve is grossly normal.  6. The tricuspid valve is grossly normal.  7. The aortic valve is tricuspid. No stenosis of the aortic valve.  8. The aorta is normal unless otherwise noted.  9. The aortic root is normal in size and structure. 10. No cardiac source of embolism identified. 11. When compared to the prior study: No comparison.  LE Dopplers 08/02/2019 Right: Findings consistent with acute deep vein thrombosis involving the right peroneal veins.  Left: Findings consistent with acute deep vein thrombosis involving the left common femoral vein, left femoral vein, left proximal profunda vein, left popliteal vein, left posterior tibial veins, and left peroneal veins. Extending up into left iliac vein  and IVC.  07/21/2019 Right: Findings consistent with acute deep vein thrombosis involving the right peroneal veins. Left: Findings consistent with acute deep vein thrombosis involving the left popliteal vein, left posterior tibial veins, and left peroneal veins.  Vas [place] Transcranial Doppler W Bubbles 07/25/2019 No HITS heard heard at rest. No HITS heard heard during valsalva.   2D echo  w/bubble 08/03/2019 Limited bubble study for shunt. No evidence for atrial level right to left shunt.  Ct Abdomen Pelvis W Contrast 08/04/2019 1. Large fluid collection in the lower right anterior abdominal wall subcutaneous fat with what appears to be an internal surgical drain, favored to represent a postoperative hematoma or proteinaceous seroma, however, the possibility of an abscess is not excluded in light of the patient's fever.  2. Small high attenuation fluid collection along the left pelvic sidewall which likely represents a hematoma, as above.  3. Additional incidental findings, as above.   PHYSICAL EXAM      General - Well nourished, well developed, middle-aged African-American male.  He has right hemicraniectomy surgical incision on the scalp.  Ophthalmologic - fundi not visualized due to noncooperation.  Cardiovascular - Regular rhythm, tachycardia.  Neuro - patient is wake,  . Nonverbal, but able to follow all simple commands on the right hand and foot. Eyes in right gaze position, barely cross midline, not blinking to visual threat to the left, PERRL. Left facial droop.  Tongue protrusion not corporative.  Has spontaneous movement  of RUE against gravity 4/5 at least and RLE in bed 2+/5 at least. On pain stimulation, mild withdraw of LLE, but no movement of LUE. DTR 1+ and no babinski. Sensation, coordination and gait not tested.   ASSESSMENT/PLAN [name] is a 43 y.o. male with no significant past medical history found down x 2 days nonverbal with L hemiparesis.   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 Trace L midline shift. ELVO at R ICA, R A1, R MCA.  MRI  Large R brain infarct sparing R PCA. Cytotoxic edema but no hemorrhage. Stable trace midline  shift.  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 R ICA terminus, R ACA, R MCA occluded.   CT head similar w/ low density and sweddling R ACA and MCA territories, now with 5-62mm midline shift. no hemorrhage  CT head acute R ACA and MCA infarct w/ progressive swelling decompressed through craniectomy defect.   CTA head and neck stable MLS. R ICA occlusion in neck that continues into R ACA and MCA. Mild petechial hemorrhage at basal ganglia  CT Head 8/29 - Unchanged appearance of massive right MCA and ACA territory infarcts with parenchyma extending through decompressive craniectomy. Small focus of suspected hemorrhage adjacent to the right caudate head.  CT Head WO - 07/30/19 - New hematoma within the infarcted tissue   CT repeat 08/01/2019 stable w/o change  2D Echo EF 60-65%  2D echo w/ bubble ordered no LRS   LE venous doppler DVT in R peroneal, L popliteal, L posterior tibial and L peroneal  LE venous doppler repeat DVT in R peroneal, L popliteal, L posterior tibial and L peroneal  CT chest Nonocclusive thrombus seen within the left posterior lower lobe segmental artery.   TCD bubble no HITS, no PFO  LDL 83  HgbA1c 5.4  UDS positive THC  Hypercoagulable and autoimmune work up negative  Heparin 1500 subq for VTE prophylaxis  No antithrombotic prior to admission, treated DVT with heparin IV, then  discontinued due to hemorrhagic conversion with right frontal hematoma, now resumed heparin IV given extensive DVT and PE  Therapy recommendations:  SNF. reconisdered CIR today but still too low level. SNF recommended.   Disposition:  pending   Cyctotoxic cerebral edema  S/p R decompressive hemicraniectomy (Nundkumar) w/ flap R abd 07/19/2019  PICC placed - keep for now per Leonie Man  Given One dose of 23.4% 8/26; 3%  off 8/30 1700  Na 146 -> 140->135->136  On NS to 75cc and decrease free water to 150 Q8  Monitor Na daily  CT repeat 07/30/19 - New parenchymal hemorrhage  within the infarcted tissue  Off helmet to further release pressure  CT repeat 08/01/2019 stable w/o change  Stable neuro check since on heparin IV  Right ICA occlusion  MRA and CTA showed right ICA occlusion from origin to terminal  Wife denies any head trauma  Wife stated that pt had recent aggressive exercise with weight lifting  Concerning dissection as working diagnosis of stroke etiology   B LE DVT Small LLL PE  LE venous doppler DVT in R peroneal, L popliteal, L posterior tibial and L peroneal  Etiology unclear  Started IV heparin 07/26/2019  IVC filter placed 8/27. Plan retrieval in 8-12 weeks as  an IP in an IR clinic  Hypercoagulable work up negative   Off SCDs  LE venous doppler 08/02/19 repeat extensive DVT in R peroneal, L popliteal, L posterior tibial and L peroneal up to the IVC filter  CT chest Nonocclusive thrombus seen within the left posterior lower lobe segmental artery.   hydrated w/ IVF @ 75cc, continue free water and TF  Treated with IV Heparin but then off secondary to intracranial hemorrhage  Restarted IV heparin given extensive DVT and PE with close neuro monitoring. Pt remains stable.  Hematology consult, appreciate help - hypercoag w/u neg.  At risk for recurrent clots. AC recommended w/ lovenox at d/c.  Factor V Leiden, homocysteine, Prot C&S, Prothombin gene mutation  All neg. JAK2 pending from 08/04/19  Appreciate TRH assistance on this complicated case  Abdominal wall hematoma  Large fluid collection seen on CT abdomen  CT abd/pel concerning for abdominal wall fluid collection likely old hematoma  Dr. Kathyrn Sheriff did fluid aspiration and seems to be old blood component  Fluid aspiration sent for culture 9/11 - no growth < 24 hrs  requested ID consult   Seizure-like activity  EEG continuous slowing, excessive beta activity, sleep spindle asymmetry decreased R->related to stroke and sedation. No SZ  Long Term EEG cortical  dysfunction in right frontal region  Seizure precautions  Continue Keppra   SIRS/fever/tachycardia/tachypnea/leukocytosis - central neurogenic fever  Tmax - 100.5  WBC - 10.7  CXR x 2 NAD  U/A x 3 - negative. 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 Secondary to stroke . NPO  Cortrak w/ TF @ 55cc  free water 150cc q8   Speech on board   Cleared for D1 thin liquid diet w/ MBSS  Remove tube with adequate po intake in a few days  Other Stroke Risk Factors  Former Cigarette smoker, quit 3 yrs ago  ETOH use  Substance abuse UDS - positive for THC   Obesity, Body mass index is 32.41 kg/m., recommend weight loss, diet and exercise as appropriate   Other Active Problems  Elevated CK/Elevated liver enzymes -  AST - 98 ; ALT - 65 - improving; CK 852 Hepatitis panel 9/12 - neg  Hypokalemia, resolved - 3.7  Normocytic anemia - hematology felt d/t ongoing blood draws 10.0  Hospital day # 23  Continue to encourage p.o. feeds but will overlap with nighttime tube feeds for couple of days.  Continue therapies.  Discussed with RN and care team.I have spent a total of  25  minutes with the patient reviewing hospital notes,  test results, labs and examining the patient as well as establishing an assessment and plan that was discussed personally with the patient.  > 50% of  time was spent in direct patient care.  Antony Contras, MD  To contact Stroke Continuity provider, please refer to [email or web address] After hours, contact General Neurology

## 2019-08-10 NOTE — Progress Notes (Signed)
Nutrition Follow-up  DOCUMENTATION CODES:   Obesity unspecified  INTERVENTION:   Nocturnal tube feeding via Cortrak: - Osmolite 1.5 @ 75 ml/hr to run over 12 hours from 2000 to 0800 - Continue Pro-stat 30 ml TID - Free water per MD  Tube feeding regimen provides 1650 kcal, 101 grams of protein, and 686 ml of H2O (72% of kcal needs, 86% of protein needs).  Do not recommend d/c TF until pt able to meet at least 50-75% of needs via po route.  - Ensure Enlive po BID, each supplement provides 350 kcal and 20 grams of protein  NUTRITION DIAGNOSIS:   Inadequate oral intake related to acute illness as evidenced by NPO status.  Progressing, pt now on Dysphagia 1 diet  GOAL:   Patient will meet greater than or equal to 90% of their needs  Progressing  MONITOR:   PO intake, Supplement acceptance, Diet advancement, TF tolerance, Weight trends, Labs, I & O's, Skin  REASON FOR ASSESSMENT:   Ventilator, Consult Enteral/tube feeding initiation and management  ASSESSMENT:   43 year old male who presented to the ED on 8/25 with AMS. No known PMH. CT head showed large R hemisphere infarct with edema and mild mass effect.  8/25 -admit,emergent decompressive hemicraniectomy 8/27 - acute DVT b/l LE, IV placement 8/31 -extubated 9/01 - Cortrak placed, tube tip in stomach 9/15 - MBS with diet advanced to Dysphagia 1 with thin liquids  Now that pt is on a PO diet, will change tube feeding regimen to nocturnal only. Discussed plan with RN.  Weight up 9 lbs total since admit. Pt with moderate pitting edema to RLE and deep pitting edema to LLE.  Current TF: Osmolite 1.5 @ 60 ml/hr, Pro-stat 30 ml TID, free water 150 ml TID  Meal Completion: 50% x 1 meal this AM  Medications reviewed and include: Pepcid, heparin IVF: NS @ 100 ml/hr  Labs reviewed: hemoglobin 10.0, elevated LFTs CBG's: 94-112 x 24 hours  UOP: 4500 ml x 24 hours I/O's: +17.6 L since admit  Diet Order:   Diet  Order            DIET - DYS 1 Room service appropriate? Yes; Fluid consistency: Thin  Diet effective now              EDUCATION NEEDS:   No education needs have been identified at this time  Skin:  Skin Assessment: Skin Integrity Issues: Skin Integrity Issues: Stage II: L face Incisions: abdomen, head  Last BM:  08/08/19  Height:   Ht Readings from Last 1 Encounters:  08/02/19 5\' 11"  (1.803 m)    Weight:   Wt Readings from Last 1 Encounters:  08/10/19 106.5 kg    Ideal Body Weight:  78.2 kg  BMI:  Body mass index is 32.75 kg/m.  Estimated Nutritional Needs:   Kcal:  [id number] kcals  Protein:  117-135  Fluid:  >/= 2.1 L/day    Gaynell Face, MS, RD, LDN Inpatient Clinical Dietitian Pager: [phone] Weekend/After Hours: [phone]

## 2019-08-10 NOTE — Progress Notes (Signed)
ANTICOAGULATION CONSULT NOTE  Pharmacy Consult:  Heparin Indication:  DVT/PE  Allergies  Allergen Reactions  . Hydrocodone Nausea Only    Patient Measurements: Height: 5\' 11"  (180.3 cm) Weight: 234 lb 12.6 oz (106.5 kg) IBW/kg (Calculated) : 75.3 Heparin Dosing Weight: 96 kg  Vital Signs: Temp: 100.5 F (38.1 C) (09/16 0706) Temp Source: Axillary (09/16 0706) BP: 130/92 (09/16 1000) Pulse Rate: 91 (09/16 1000)  Labs: Recent Labs    08/08/19 0530 08/08/19 0857  08/09/19 0213 08/09/19 1710 08/10/19 0618  HGB 9.0*  --   --  10.0*  --  10.0*  HCT 28.4*  --   --  30.4*  --  29.4*  PLT 274  --   --  278  --  282  HEPARINUNFRC 0.39  --    < > 0.27* <0.10* 0.34  CREATININE  --   --   --  0.79  --   --   CKTOTAL  --  3,012*  --  2,063*  --  852*   < > = values in this interval not displayed.    Estimated Creatinine Clearance: 150.9 mL/min (by C-G formula based on SCr of 0.79 mg/dL).   Assessment: 77 YOM presented with acute ischemic CVA and did not receive tPA.  There was an incidental finding of DVT and IVC filter was placed on 07/21/19 as patient was not a candidate for [facility] due to craniectomy.  He was cleared to start IV heparin, but then stopped on 07/30/19 due to right frontal lobe hemorrhage.  CTA on 08/01/19 showed non-occlusive thrombus and repeat Doppler on 08/02/19 positive for bilateral DVTs.  Heparin restarted on 08/02/19 given extensive DVTs and PE.    Hep lvl this am within goal  CBC stable  Goal of Therapy:  Heparin level 0.3-0.5 units/ml Monitor platelets by anticoagulation protocol: Yes    Plan:   Continue heparin 1250 units/hr Daily Hep lvl cbc Monitor for s/sx of bleeding  Levester Fresh, PharmD, BCPS, BCCCP Clinical Pharmacist [phone]  Please check AMION for all [place] numbers  08/10/2019 10:54 AM

## 2019-08-10 NOTE — Progress Notes (Signed)
Hematology Short note  Reviewed pending hematology results from time of initial consultation  JAK2 GenotypR Comment   Comment: (NOTE)  Result: NEGATIVE for the JAK2 [id number] mutation.    Prothrombin gene mutation Order: [id number] Status:  Final result Visible to patient:  No (not released) Next appt:  None Component 6d ago  Recommendations-PTGENE: Comment   Comment: (NOTE)  NEGATIVE  No mutation identified.        Factor 5 leiden Order: [id number] Status:  Final result Visible to patient:  No (not released) Next appt:  None Component 6d ago  Recommendations-F5LEID: Comment   Comment: (NOTE)  Result: Negative (no mutation found)         Hemoglobinopathy evaluation Order: [id number] Status:  Edited Result - FINAL Visible to patient:  No (not released) Next appt:  None  Ref Range & Units 6d ago  Hgb A2 Quant 1.8 - 3.2 % 2.2   Hgb F Quant 0.0 - 2.0 % 0.0   Hgb S Quant 0.0 % 0.0   Hgb C 0.0 % 0.0 VC   Hgb A 96.4 - 98.8 % 97.8   Hgb Variant 0.0 % 0.0 VC   Please Note:  Comment VC   Comment: (NOTE)  Normal adult hemoglobin present.  Performed At: [facility]  [address] [place], [place] [id number]       \ Hemoglobinopathy evaluation Order: [id number] Status:  Edited Result - FINAL Visible to patient:  No (not released) Next appt:  None  Ref Range & Units 6d ago  Hgb A2 Quant 1.8 - 3.2 % 2.2   Hgb F Quant 0.0 - 2.0 % 0.0   Hgb S Quant 0.0 % 0.0   Hgb C 0.0 % 0.0 VC   Hgb A 96.4 - 98.8 % 97.8   Hgb Variant 0.0 % 0.0 VC   Please Note:  Comment VC   Comment: (NOTE)  Normal adult hemoglobin present.  Performed At: [facility]  [place], [place] [id number]        Component     Latest Ref Rng & Units 08/04/2019  Protein C-Functional     73 - 180 % 127  Protein C, Total     60 - 150 % 111  Protein S-Functional     63 - 140 % 78  Protein S, Total     60 - 150 % 138  Homocysteine     0.0 - 14.5 umol/L 9.0     Assessment No overt evidence of MPN, hemoglobinopathy or overt thrombophilia based on labs PLAN -continue mx per neurology  Sullivan Lone MD MS

## 2019-08-10 NOTE — Progress Notes (Signed)
Physical Therapy Treatment Patient Details Name: Matthew Black MRN: [id number] DOB: 19-May-1976 Today's Date: 08/10/2019    History of Present Illness Pt is a 43 y.o. M with no known PMH who presents on 8/25 after being found down at work, nonverbal with L hemiparesis. CT head showing large R hemisphere infarct with edema and mild mass effect. 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 Stable trace midline shift. S/p hemicraniectomy for  edema with abdominal flap implant. BLE DVT vasc US 8/28: acute DVT bilateral lower extremities, s/p IVC placement.     PT Comments    Pt lethargic. Used lift for OOB to chair.    Follow Up Recommendations  SNF     Equipment Recommendations  Wheelchair (measurements PT);Wheelchair cushion (measurements PT);Hospital bed;Other (comment)(hoyer lift)    Recommendations for Other Services       Precautions / Restrictions Precautions Precautions: Fall;Other (comment) Precaution Comments: L hemiparesis, R skull missing, abd bone flap Other Brace: helmet (not wearing currently due to swelling)    Mobility  Bed Mobility Overal bed mobility: Needs Assistance Bed Mobility: Rolling Rolling: Total assist;+2 for physical assistance         General bed mobility comments: Assist for all aspects  Transfers Overall transfer level: Needs assistance               General transfer comment: Used maxisky for transfer to bed to chair  Ambulation/Gait                 Stairs             Wheelchair Mobility    Modified Rankin (Stroke Patients Only) Modified Rankin (Stroke Patients Only) Pre-Morbid Rankin Score: No symptoms Modified Rankin: Severe disability     Balance Overall balance assessment: Needs assistance   Sitting balance-Leahy Scale: Zero Sitting balance - Comments: +2 total to bring trunk forward from back of chair Postural control: Posterior lean;Left lateral lean                                   Cognition Arousal/Alertness: Lethargic Behavior During Therapy: Flat affect Overall Cognitive Status: Difficult to assess                                 General Comments: Pt with eyes closed and not following commands throughout      Exercises      General Comments        Pertinent Vitals/Pain Pain Assessment: Faces Faces Pain Scale: No hurt    Home Living                      Prior Function            PT Goals (current goals can now be found in the care plan section) Acute Rehab PT Goals Patient Stated Goal: unable Progress towards PT goals: Not progressing toward goals - comment    Frequency    Min 2X/week      PT Plan Current plan remains appropriate;Frequency needs to be updated    Co-evaluation              AM-PAC PT "6 Clicks" Mobility   Outcome Measure  Help needed turning from your back to your side while in a flat bed without using bedrails?: Total  Help needed moving from lying on your back to sitting on the side of a flat bed without using bedrails?: Total Help needed moving to and from a bed to a chair (including a wheelchair)?: Total Help needed standing up from a chair using your arms (e.g., wheelchair or bedside chair)?: Total Help needed to walk in hospital room?: Total Help needed climbing 3-5 steps with a railing? : Total 6 Click Score: 6    End of Session   Activity Tolerance: Patient limited by lethargy Patient left: with call bell/phone within reach;in chair;with chair alarm set Nurse Communication: Mobility status PT Visit Diagnosis: Hemiplegia and hemiparesis;Other symptoms and signs involving the nervous system (R29.898);Other abnormalities of gait and mobility (R26.89) Hemiplegia - Right/Left: Left Hemiplegia - dominant/non-dominant: Non-dominant Hemiplegia - caused by: Cerebral infarction     Time: 1001-1023 PT Time Calculation (min) (ACUTE ONLY): 22  min  Charges:  $Therapeutic Activity: 8-22 mins                     [place] Pager [phone] Office [place] 08/10/2019, 1:41 PM

## 2019-08-10 NOTE — Progress Notes (Signed)
SLP Cancellation Note  Patient Details Name: Matthew Black MRN: [id number] DOB: 1976-05-11   Cancelled treatment:       Reason Eval/Treat Not Completed: Patient at procedure or test/unavailable. Pt currently working with PT. RN reports pt ate about 1/2 of his breakfast. Intermittent cough with thin liquids. SLP will continue to assess po tolerance and provide education. MBS results reviewed with RN. Recommend continued caution with liquids, insuring pt is fully awake, alert and at 90 degrees upright during po intake.   Amreen Raczkowski B. Quentin Ore [facility], CCC-SLP Speech Language Pathologist [phone]  Shonna Chock 08/10/2019, 10:05 AM

## 2019-08-11 ENCOUNTER — Inpatient Hospital Stay (HOSPITAL_COMMUNITY): Payer: Medicaid Other

## 2019-08-11 LAB — GLUCOSE, CAPILLARY
Glucose-Capillary: 105 mg/dL — ABNORMAL HIGH (ref 70–99)
Glucose-Capillary: 105 mg/dL — ABNORMAL HIGH (ref 70–99)
Glucose-Capillary: 113 mg/dL — ABNORMAL HIGH (ref 70–99)
Glucose-Capillary: 113 mg/dL — ABNORMAL HIGH (ref 70–99)
Glucose-Capillary: 118 mg/dL — ABNORMAL HIGH (ref 70–99)
Glucose-Capillary: 120 mg/dL — ABNORMAL HIGH (ref 70–99)
Glucose-Capillary: 98 mg/dL (ref 70–99)

## 2019-08-11 LAB — CULTURE, BLOOD (ROUTINE X 2)
Culture: NO GROWTH
Culture: NO GROWTH
Special Requests: ADEQUATE

## 2019-08-11 LAB — HEPARIN LEVEL (UNFRACTIONATED): Heparin Unfractionated: 0.41 IU/mL (ref 0.30–0.70)

## 2019-08-11 LAB — CK: Total CK: 595 U/L — ABNORMAL HIGH (ref 49–397)

## 2019-08-11 MED ORDER — BROMOCRIPTINE MESYLATE 2.5 MG PO TABS
5.0000 mg | ORAL_TABLET | Freq: Two times a day (BID) | ORAL | Status: DC
  Administered 2019-08-11 – 2019-08-12 (×2): 5 mg via ORAL
  Filled 2019-08-11 (×3): qty 2

## 2019-08-11 NOTE — Progress Notes (Signed)
ANTICOAGULATION CONSULT NOTE  Pharmacy Consult:  Heparin Indication:  DVT/PE  Allergies  Allergen Reactions  . Hydrocodone Nausea Only    Patient Measurements: Height: 5\' 11"  (180.3 cm) Weight: 232 lb 5.8 oz (105.4 kg) IBW/kg (Calculated) : 75.3 Heparin Dosing Weight: 96 kg  Vital Signs: Temp: 100.4 F (38 C) (09/17 0835) Temp Source: Axillary (09/17 0835) BP: 113/77 (09/17 0800) Pulse Rate: 88 (09/17 0800)  Labs: Recent Labs    08/09/19 0213 08/09/19 1710 08/10/19 0618 08/11/19 0338  HGB 10.0*  --  10.0*  --   HCT 30.4*  --  29.4*  --   PLT 278  --  282  --   HEPARINUNFRC 0.27* <0.10* 0.34 0.41  CREATININE 0.79  --   --   --   CKTOTAL 2,063*  --  852* 595*    Estimated Creatinine Clearance: 150 mL/min (by C-G formula based on SCr of 0.79 mg/dL).   Assessment: 67 YOM presented with acute ischemic CVA and did not receive tPA.  There was an incidental finding of DVT and IVC filter was placed on 07/21/19 as patient was not a candidate for [facility] due to craniectomy.  He was cleared to start IV heparin, but then stopped on 07/30/19 due to right frontal lobe hemorrhage.  CTA on 08/01/19 showed non-occlusive thrombus and repeat Doppler on 08/02/19 positive for bilateral DVTs.  Heparin restarted on 08/02/19 given extensive DVTs and PE.    Hep lvl this am within goal  No cbc drawn this am  Goal of Therapy:  Heparin level 0.3-0.5 units/ml Monitor platelets by anticoagulation protocol: Yes    Plan:   Continue heparin 1250 units/hr Daily Hep lvl cbc F/U plans for oral anticoag Monitor for s/sx of bleeding  Levester Fresh, PharmD, BCPS, BCCCP Clinical Pharmacist [phone]  Please check AMION for all [place] numbers  08/11/2019 9:14 AM

## 2019-08-11 NOTE — Progress Notes (Signed)
STROKE TEAM PROGRESS NOTE   INTERVAL HISTORY Patient again relapse with temperature 100.4.  Neurologically is unchanged and is awake arousable follows commands well on the right side.  Right abdominal swelling appears slightly increased.  Obtain abdominal ultrasound which shows fluid consistency to be unchanged from before and likely represents hematoma proteinaceous fluid rather than an abscess.  CT scan of the brain was also obtained which shows stable appearance of the large right hemorrhagic infarct with decrease in size of the previous right frontal hematoma with a few small new 1 cm area of hemorrhages along the periphery of the infarcted tissue beneath the craniectomy site but no significant midline shift or mass-effect.  Patient has been eating well during the day and he gets supplementary tube feeds overnight.  Vitals:   08/11/19 1000 08/11/19 1100 08/11/19 1200 08/11/19 1300  BP: 120/76   116/80  Pulse: 90 88 92 98  Resp: (!) 25 20 19 18   Temp:      TempSrc:      SpO2: 100% 100% 100% 100%  Weight:      Height:        CBC:  Recent Labs  Lab 08/09/19 0213 08/10/19 0618  WBC 10.0 10.7*  HGB 10.0* 10.0*  HCT 30.4* 29.4*  MCV 86.4 84.7  PLT 278 [id number]    Basic Metabolic Panel:  Recent Labs  Lab 08/06/19 0635 08/07/19 0300 08/09/19 0213  NA 136 136 138  K 3.6 3.6 3.7  CL 103 106 105  CO2 23 24 25   GLUCOSE 134* 123* 112*  BUN 19 17 14   CREATININE 0.86 0.77 0.79  CALCIUM 8.4* 8.2* 8.5*  MG 2.0 2.1  --   PHOS 3.2 3.2  --     IMAGING Ct Head Wo Contrast Ct Cervical Spine Wo Contrast  07/19/2019 1132 1. 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 2. No associated hemorrhage and mild intracranial mass effect at this time, including trace leftward midline shift. 3. Evidence of large vessel occlusion: Hyperdensity of the right ICA terminus, the right A1 and MCA. 4. Unaffected brain parenchyma appears negative. 5.  No  acute traumatic injury identified in the cervical spine.   Mr Brain 83 Contrast Mr Angio Head Wo Contrast Mr Angio Neck Wo Contrast 07/19/2019 1330 1. 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 The right ICA terminus, right ACA and MCA also appear occluded. 2. 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 Cytotoxic edema but no convincing hemorrhage. Stable mild mass effect with trace leftward midline shift. 3. Intracranial MRA is degraded by motion artifact, but no other large vessel occlusion is suspected. There appears to be lobes reperfusion of the right PCA. 4. The left hemisphere and posterior fossa brain parenchyma appears normal.   Ct Head Wo Contrast 07/19/2019 1532 Similar appearance to earlier. Low-density and swelling of the right hemisphere in the ACA and MCA territories with mass effect and right-to-left shift of 5-6 mm. No hemorrhagic transformation at this time.   Ct Head Wo Contrast 07/20/2019 0444 1. Acute right ACA and MCA territory infarct with progressive swelling that has decompressed through the craniectomy defect. 2. No acute hemorrhage or new infarction.   Ct Angio Head W Or Wo Contrast Ct Angio Neck W Or Wo Contrast 07/21/2019 1. Stable from prior MRA. 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 No evidence of atherosclerosis or vasculopathy  in the other vessels. 2. Cytotoxic edema causes 5 mm of midline shift and brain bulging through the craniectomy defect. Mild petechial hemorrhage is seen at the basal ganglia.   Ct Head Wo Contrast 07/23/2019 1. Unchanged appearance of massive right MCA and ACA territory infarcts with parenchyma extending through decompressive craniectomy. 2. Small focus of suspected hemorrhage adjacent to the right caudate head. 07/30/2019 1. Decreasing mass effect within large right anterior MCA and ACA territory infarct. 2. Midline shift is no longer present. There is  decreased effacement of the right lateral ventricle. 3. Infarcted brain tissue still herniates through the craniectomy site. 4. New parenchymal hemorrhage within the infarcted tissue anteriorly measures 2.5 x 2.3 x 2.7 cm. 5. No new infarct. 08/01/2019  1. Stable head CT since 07/30/2019. 2. Again noted is a large infarct involving the right MCA and right ACA territories with brain tissue herniating through the right craniotomy defect. 3. Parenchymal hemorrhage in the right anterior cortex has minimally changed. Stable petechial hemorrhage in the right basal ganglia region.  08/11/2019 1. New patchy parenchymal hemorrhages along the periphery of the infarcted RIGHT frontoparietal lobe, compatible with hemorrhagic conversion, largest focus underlying the RIGHT frontal bone measures 1.3 cm greatest dimension. 2. Previously described dominant focus of hemorrhage along the anterior margin of the craniectomy site has decreased in size and density compared to previous exams, compatible with expected evolution. 3. Stable herniation of infarcted brain through the RIGHT frontal-parietal-temporal craniectomy site. 4. No additional mass effect or midline shift on today's exam.  Dg Chest Port 1 View 08/04/2019 Well-positioned support structures. No active cardiopulmonary disease.  08/01/2019 No acute disease   Ct Angio Chest Pe W Or Wo Contrast 08/01/2019 Nonocclusive thrombus seen within the left posterior lower lobe segmental artery.    [place] ABD Limited 08/11/2019 Suspected mildly complex seroma versus liquified hematoma in the superficial soft tissues of the RIGHT abdomen, measuring 7.9 cardiomyopathy greatest dimension, surrounding patient's calvarium (location of patient's skull status post partial craniectomy), appearance less suggestive of abscess. The collection seen today by ultrasound is similar to the appearance of the collection on earlier CT abdomen of 08/04/2019.  2D Echocardiogram 07/20/2019  1.  The left ventricle has normal systolic function with an ejection fraction of 60-65%. The cavity size was normal. Left ventricular diastolic parameters were normal.  2. The right ventricle has normal systolic function. The cavity was normal. There is no increase in right ventricular wall thickness.  3. The pericardial effusion is circumferential.  4. Trivial pericardial effusion is present.  5. The mitral valve is grossly normal.  6. The tricuspid valve is grossly normal.  7. The aortic valve is tricuspid. No stenosis of the aortic valve.  8. The aorta is normal unless otherwise noted.  9. The aortic root is normal in size and structure. 10. No cardiac source of embolism identified. 11. When compared to the prior study: No comparison.  LE Dopplers 08/02/2019 Right: Findings consistent with acute deep vein thrombosis involving the right peroneal veins.  Left: Findings consistent with acute deep vein thrombosis involving the left common femoral vein, left femoral vein, left proximal profunda vein, left popliteal vein, left posterior tibial veins, and left peroneal veins. Extending up into left iliac vein  and IVC.  07/21/2019 Right: Findings consistent with acute deep vein thrombosis involving the right peroneal veins. Left: Findings consistent with acute deep vein thrombosis involving the left popliteal vein, left posterior tibial veins, and left peroneal veins.  Vas [place] Transcranial Doppler W  Bubbles 07/25/2019 No HITS heard heard at rest. No HITS heard heard during valsalva.   2D echo w/bubble 08/03/2019 Limited bubble study for shunt. No evidence for atrial level right to left shunt.  Ct Abdomen Pelvis W Contrast 08/04/2019 1. Large fluid collection in the lower right anterior abdominal wall subcutaneous fat with what appears to be an internal surgical drain, favored to represent a postoperative hematoma or proteinaceous seroma, however, the possibility of an abscess is not excluded in light  of the patient's fever.  2. Small high attenuation fluid collection along the left pelvic sidewall which likely represents a hematoma, as above.  3. Additional incidental findings, as above.   PHYSICAL EXAM     General- Well nourished, well developed, middle-aged African-American male. He has right hemicraniectomy surgical incision on the scalp.  Ophthalmologic- fundi not visualized due to noncooperation.  Cardiovascular - Regular rhythm, tachycardia.  Neuro - patient is wake,  . Nonverbal, but able to follow all simple commands on the right hand and foot. Eyes in right gaze position, barely cross midline, not blinking to visual threat to the left, PERRL. Left facial droop. Tongue protrusion not corporative. Has spontaneous movement of RUE against gravity 4/5 at least and RLE in bed 2+/5 at least. On pain stimulation, mild withdraw of LLE, but no movement of LUE. DTR 1+ and no babinski. Sensation, coordination and gait not tested.  ASSESSMENT/PLAN Mr. HAVIS MCSWEENEY is a 43 y.o. male with no significant past medical history found down x 2 days nonverbal with L hemiparesis.   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 Trace L midline shift. ELVO at R ICA, R A1, R MCA.  MRI  Large R brain infarct sparing R PCA. Cytotoxic edema but no hemorrhage. Stable trace midline shift.  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 R ICA terminus, R ACA, R MCA occluded.   CT head similar w/ low density and sweddling R ACA and MCA territories, now with 5-12mm midline shift. no hemorrhage  CT head acute R ACA and MCA infarct w/ progressive swelling decompressed through craniectomy defect.   CTA head and neck stable MLS. R ICA occlusion in neck that  continues into R ACA and MCA. Mild petechial hemorrhage at basal ganglia  CT Head 8/29 - Unchanged appearance of massive right MCA and ACA territory infarcts with parenchyma extending through decompressive craniectomy. Small focus of suspected hemorrhage adjacent to the right caudate head.  CT Head WO - 07/30/19 - New hematoma within the infarcted tissue   CT repeat 08/01/2019 stable w/o change  CT head repeat 08/11/19 - new patchy hemorrhage along periphery of infarcted R frontoparietal lobe (likely HT) decreased dominant anterior margin hemorrhage. Stable herniation of infarcted brain through crani site.   2D Echo EF 60-65%  2D echo w/ bubble ordered no LRS   LE venous doppler DVT in R peroneal, L popliteal, L posterior tibial and L peroneal  LE venous doppler repeat DVT in R peroneal, L popliteal, L posterior tibial and L peroneal  CT chest Nonocclusive thrombus seen within the left posterior lower lobe segmental artery.   TCD bubble no HITS, no PFO  LDL 83  HgbA1c 5.4  UDS positive THC  Hypercoagulable and autoimmune  work up negative  Heparin 1500 subq for VTE prophylaxis  No antithrombotic prior to admission, treated DVT with heparin IV, then  discontinued due to hemorrhagic conversion with right frontal hematoma, now resumed heparin IV given extensive DVT and PE  Therapy recommendations:  SNF (reassessed by CIR and declined 9/16)  Disposition:  pending   Cyctotoxic cerebral edema  S/p R decompressive hemicraniectomy (Nundkumar) w/ flap R abd 07/19/2019  PICC placed - keep for now per Leonie Man  Given One dose of 23.4% 8/26; 3%  off 8/30 1700  Na 136  On NS to 75cc and decrease free water to 150 Q8  Monitor Na daily  CT repeat 07/30/19 - New parenchymal hemorrhage within the infarcted tissue  Off helmet to further release pressure  CT repeat 08/01/2019 stable w/o change  CT head repeat 08/11/19 - new patchy hemorrhage along periphery of infarcted R frontoparietal lobe  (likely HT) but previous right frontal hematoma now much reduced in size  ongoing neuro check since on heparin IV  Right ICA occlusion  MRA and CTA showed right ICA occlusion from origin to terminal  Wife denies any head trauma  Wife stated that pt had recent aggressive exercise with weight lifting  Concerning dissection as working diagnosis of stroke etiology   B LE DVT Small LLL PE  LE venous doppler DVT in R peroneal, L popliteal, L posterior tibial and L peroneal  Etiology unclear  Started IV heparin 07/26/2019  IVC filter placed 8/27. Plan retrieval in 8-12 weeks as an IP in an IR clinic  Hypercoagulable work up negative   Off SCDs  LE venous doppler 08/02/19 repeat extensive DVT in R peroneal, L popliteal, L posterior tibial and L peroneal up to the IVC filter  CT chest Nonocclusive thrombus seen within the left posterior lower lobe segmental artery.   hydrated w/ IVF @ 75cc, continue free water and TF  Treated with IV Heparin but then off secondary to intracranial hemorrhage  Restarted IV heparin given extensive DVT and PE with close neuro monitoring. Pt remains stable.  Hematology consult, appreciate help - hypercoag w/u neg.  At risk for recurrent clots. AC recommended w/ lovenox at d/c.  Factor V Leiden, homocysteine, Prot C&S, Prothombin gene mutation  All neg.   Appreciate TRH assistance on this complicated case  Abdominal wall hematoma  Large fluid collection seen on CT abdomen  CT abd/pel concerning for abdominal wall fluid collection likely old hematoma  Dr. Kathyrn Sheriff did fluid aspiration and seems to be old blood component  Fluid aspiration sent for culture 9/11 - no growth < 24 hrs  Repeat ABD [place] complex seroma vs liquified hematoma R abd similar to previous  requested ID consult   Seizure-like activity  EEG continuous slowing, excessive beta activity, sleep spindle asymmetry decreased R->related to stroke and sedation. No SZ  Long Term EEG  cortical dysfunction in right frontal region  Seizure precautions  Continue Keppra   SIRS/fever/tachycardia/tachypnea/leukocytosis - central neurogenic fever  Tmax - 101.7   WBC - 10.7  CXR x 2 NAD  U/A x 3 - negative. UCx neg   Blood cultures - neg, repeated again 08/04/19 -> no growth on empiric unasyn -> changed to vanco, cefepime and flagyl 08/05/19  Lactic acid 1.2->2.0  Hepatitis panel 9/12 - neg  On baclofen and Bromocriptine for central neurogenic fever  Spike temp overnight - increased bromocriptine to 5 bid  Tachycardia  HR 98 - 120  likely due to fever and dehydration and  PE  Hydration with IVF, TF and free water  Increase metoprolol to 50 Q8h  EKG ST, rate 120 w/ possible LA enlargement  Troponin series neg x 3    Still more likely due to fever and SIRS  Hyperlipidemia  Home meds:  no statin  LDL 83, goal < 70  Was on lipitor 20 mg daily   D/c statin due to elevated LFT  Dysphagia . Secondary to stroke . NPO  Cortrak w/ TF @ 55cc  Decreased free water 150cc q4->q8   Speech on board   May need PEG, wife want to wait for a new more days  Other Stroke Risk Factors  Former Cigarette smoker, quit 3 yrs ago  ETOH use  Substance abuse UDS - positive for THC   Obesity, Body mass index is 32.41 kg/m., recommend weight loss, diet and exercise as appropriate   Other Active Problems  Elevated CK/Elevated liver enzymes - AST - 98; ALT - 65 - improving;CK 852 Hepatitis panel 9/12 - neg  Hypokalemia, resolved - 3.7  Normocytic anemia - hematology felt d/t ongoing blood draws 10.0  Hospital day # 23 Recommend increase bromocriptine dose to 5 mg twice daily to help with central neurogenic fever.  Continue IV heparin despite new small hematomas as clinically his neurological exam is stable despite this.  Continue to encourage oral intake and may consider discontinuing panda tube in a couple of days.  Transfer out of ICU to neurology floor  bed in the next couple of days if he remains stable.  I called the patient's wife and left a message on the answering machine to call me back to discuss his care.  Discussed with Dr. Joesph Fillers medical hospitalist and prefer patient be transferred to medical hospitalist team as primary attending given his significant ongoing medical problems and stroke team will continue to follow on a daily basis.  Greater than 50% time during this 35-minute visit was spent on counseling and coordination of care about his intracerebral hemorrhage, DVT, pulmonary embolism and management of neurogenic fever.   Antony Contras, MD  To contact Stroke Continuity provider, please refer to [email or web address] After hours, contact General Neurology

## 2019-08-11 NOTE — Progress Notes (Addendum)
PROGRESS NOTE    Matthew Black  [id number] DOB: 11/26/1975 DOA: 07/19/2019 PCP: Colon Branch, MD   Brief Narrative:  Juwuan Brutsche Tuckeris an 43 y.o.malewithout significant known PMH who presented on 8/25 with AMS as well as L hemiparesis and aphasia. UDS + for THC. CT with large R hemispheric infarct with confluent cytotoxic edema in R ACA and MCA territories and mild mass effect. 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 He was admitted to [facility] with neurology consultation and started on 3% saline. Neurosurgery was consulted and Dr. Kathyrn Sheriff recommended early decompressive hemicraniectomy. He subsequently developed hemorrhagic conversion with hematoma on 9/5; helmet was removed to further release intracranial pressure and heparin was discontinued. LE doppler also indicated DVT in R peroneal, L popliteal, L posterior tibial, and L peroneal veins; IVC filter was placed. Patient had seizure-like activity, for which he is on Keppra. He was subsequently found to have possible RLL infiltrate and with concern for aspiration he was made NPO and started on Unasyn. CTA was performed and it showed a non-occlusive LLL thrombus on 9/7.Subsequent repeat DVT US was performed 9/8 and showed acute R DVT of the peroneal veins as well as acute L DVT involving common femoral vein, L femoral vein, L proximal profunda vein, L popliteal vein, L posterior tibial veins, and L peroneal veins - and it extends up into the left iliac vein and IVC.  CT abdomen showed large fluid collection in lower right anterior abdominal which could be hematoma, seroma or an abscess.  Neurosurgery consulted and tapped the abdominal hematoma. Hospital course remarkable for persistent fever, Cultures negative so far.  Antibiotics have been stopped .  Assessment & Plan:   Active Problems:   Stroke (cerebrum) (HCC)   Pressure injury of skin   Acute respiratory failure (HCC)   Endotracheal tube present   Deep  vein thrombosis (DVT) of non-extremity vein   Hypokalemia   FUO (fever of unknown origin)   Acute blood loss anemia   SIRS (systemic inflammatory response syndrome) (HCC)   Leukocytosis   Primary hypercoagulable state ([place])   Acute pulmonary embolism without acute cor pulmonale (HCC)   Right MCA/ACA infarct with left hemiparesis: Patient is still encephalopathic but mental status slowly improving.  Acute  encephalopathy due to massive CVA.  Has right ICA, MCA and ACA occlusions.  Status post hemicraniectomy with abdominal flap implant.  Hemorrhagic conversion of CVA when he was started on heparin for DVT.  Heparin has been resumed.  Neurology/neurosurgery managing.  -d/w Sethi--- CT head from 08/11/2019 reviewed by Dr. Viviana Simpler per Dr. Leonie Man okay to continue IV heparin, may transition to p.o. Eliquis at 5 mg p.o. twice daily without loading dose upon discharge  Bilateral lower extremity DVT/nonocclusive left lower lobe PE: Currently on heparin.  Also has IVC filter.  Stable abdominal wall hematoma under the abdominal flap incision site.  Has edema of the left lower extremity.  Seizure-like activity: On Keppra as per neurology.  Persistent Fever:Marland Kitchen  Extensive work-up did not reveal any clear source of infection.  Suspected to be  associated  with hemorrhagic CVA. Antibiotics have been stopped.  Chills resolved with bromocriptine. -Fevers persist on and off without source for infection query CNS /central fever  Abdominal hematoma: CT abdomen showed large fluid collection in lower right anterior abdominal which could be hematoma, seroma .  Neurosurgery consulted and tapped the abdominal hematoma.  Cultures negative so far. -Due to persistent fevers we obtained repeat abdominal  ultrasound on 08/11/2019 suggest seroma or organizing hematoma, abscess is thought to be less likely  Elevated CK/Elevated liver enzymes:Found on the floor. CK elevated  due to rhabdomyolysis and is improving with IV fluids.   Also has mild elevated liver enzymes.  Hepatitis panel negative  Normocytic anemia: Currently H&H stable.  Dysphagia: Encephalopathy due to massive CVA.  On feeding tube.Speech following ,underwent MBS.Started on dysphagia 1 diet. I think we can remove the feeding tube now.  Nutrition Problem: Inadequate oral intake Etiology: acute illness      DVT prophylaxis: Heparin IV Code Status: Full Family Communication: None present at the bedside Disposition Plan: As per neurology.Likely SNF/CIR    Procedures: Hemicraniectomy  Antimicrobials:  Anti-infectives (From admission, onward)   Start     Dose/Rate Route Frequency Ordered Stop   08/05/19 1000  vancomycin (VANCOCIN) 1,250 mg in sodium chloride 0.9 % 250 mL IVPB  Status:  Discontinued     1,250 mg 166.7 mL/hr over 90 Minutes Intravenous Every 12 hours 08/04/19 1856 08/08/19 0914   08/05/19 0600  ceFEPIme (MAXIPIME) 2 g in sodium chloride 0.9 % 100 mL IVPB  Status:  Discontinued     2 g 200 mL/hr over 30 Minutes Intravenous Every 8 hours 08/04/19 1856 08/08/19 0914   08/04/19 2200  metroNIDAZOLE (FLAGYL) IVPB 500 mg  Status:  Discontinued     500 mg 100 mL/hr over 60 Minutes Intravenous Every 8 hours 08/04/19 1743 08/06/19 1548   08/04/19 1830  vancomycin (VANCOCIN) 2,000 mg in sodium chloride 0.9 % 500 mL IVPB     2,000 mg 250 mL/hr over 120 Minutes Intravenous  Once 08/04/19 1805 08/04/19 2053   08/04/19 1815  ceFEPIme (MAXIPIME) 2 g in sodium chloride 0.9 % 100 mL IVPB     2 g 200 mL/hr over 30 Minutes Intravenous  Once 08/04/19 1805 08/04/19 1842   07/31/19 2200  Ampicillin-Sulbactam (UNASYN) 3 g in sodium chloride 0.9 % 100 mL IVPB  Status:  Discontinued     3 g 200 mL/hr over 30 Minutes Intravenous Every 6 hours 07/31/19 1401 08/04/19 1743   07/31/19 1415  Ampicillin-Sulbactam (UNASYN) 3 g in sodium chloride 0.9 % 100 mL IVPB     3 g 200 mL/hr over 30 Minutes Intravenous STAT 07/31/19 1401 07/31/19 1551   07/19/19 2315   bacitracin 50,000 Units in sodium chloride 0.9 % 500 mL irrigation  Status:  Discontinued       As needed 07/19/19 2315 07/19/19 2339      Subjective:  Patient seen and examined the bedside this morning.  Hemodynamically stable.  He has been started on dysphagia 1 diet.  Continues to remain encephalopathic.  Awake but not much alert or oriented.  Did not follow any commands to me again today  Objective: Vitals:   08/11/19 1400 08/11/19 1500 08/11/19 1600 08/11/19 1700  BP: (!) 173/145 (!) 113/94 121/90 (!) 114/91  Pulse: (!) 103 90 92 95  Resp: 17 16 (!) 26 (!) 24  Temp:      TempSrc:      SpO2: 100% 100% 100% 100%  Weight:      Height:        Intake/Output Summary (Last 24 hours) at 08/11/2019 1737 Last data filed at 08/11/2019 1300 Gross per 24 hour  Intake 3376.3 ml  Output 3850 ml  Net -473.7 ml   Filed Weights   08/09/19 0500 08/10/19 0500 08/11/19 0500  Weight: 103.7 kg 106.5 kg 105.4 kg  Examination:  General exam: Resting comfortably HEENT: Eyes open,Oral mucosa moist,   feeding tube, staples on the scalp Respiratory system: Bilateral equal air entry, normal vesicular breath sounds, no wheezes or crackles  Cardiovascular system: S1 & S2 heard, RRR. No JVD, murmurs, rubs, gallops or clicks. Gastrointestinal system: Abdomen is nondistended, soft and nontender.  Normal bowel sounds heard.  Staples on the right lower quadrant with area of swelling around the staples (part of his cranium is located at this site) Central nervous system: Awake, apparently able to follow some commands, apparently able to take oral intake  extremities: Left lower extremity edema Skin: No rashes, lesions or ulcers,no icterus ,no pallor  Data Reviewed:   CBC: Recent Labs  Lab 08/06/19 0635 08/07/19 0300 08/08/19 0530 08/09/19 0213 08/10/19 0618  WBC 13.1* 15.1* 11.2* 10.0 10.7*  HGB 9.4* 9.7* 9.0* 10.0* 10.0*  HCT 29.4* 29.9* 28.4* 30.4* 29.4*  MCV 88.0 86.7 87.1 86.4 84.7  PLT  221 282 274 278 [id number]   Basic Metabolic Panel: Recent Labs  Lab 08/05/19 0457 08/06/19 0635 08/07/19 0300 08/09/19 0213  NA  --  136 136 138  K  --  3.6 3.6 3.7  CL  --  103 106 105  CO2  --  23 24 25   GLUCOSE  --  134* 123* 112*  BUN  --  19 17 14   CREATININE  --  0.86 0.77 0.79  CALCIUM  --  8.4* 8.2* 8.5*  MG 2.1 2.0 2.1  --   PHOS 3.7 3.2 3.2  --    GFR: Estimated Creatinine Clearance: 150 mL/min (by C-G formula based on SCr of 0.79 mg/dL). Liver Function Tests: Recent Labs  Lab 08/06/19 0635 08/07/19 0300 08/09/19 0213  AST 105* 118* 98*  ALT 68* 60* 65*  ALKPHOS 85 79 74  BILITOT 0.6 1.2 0.6  PROT 7.3 6.5 6.6  ALBUMIN 2.7* 2.6* 2.5*   No results for input(s): LIPASE, AMYLASE in the last 168 hours. No results for input(s): AMMONIA in the last 168 hours. Coagulation Profile: No results for input(s): INR, PROTIME in the last 168 hours. Cardiac Enzymes: Recent Labs  Lab 08/07/19 0300 08/08/19 0857 08/09/19 0213 08/10/19 0618 08/11/19 0338  CKTOTAL 3,198* 3,012* 2,063* 852* 595*   BNP (last 3 results) No results for input(s): PROBNP in the last 8760 hours. HbA1C: No results for input(s): HGBA1C in the last 72 hours. CBG: Recent Labs  Lab 08/11/19 0007 08/11/19 0414 08/11/19 0837 08/11/19 1217 08/11/19 1652  GLUCAP 98 118* 105* 113* 113*   Lipid Profile: No results for input(s): CHOL, HDL, LDLCALC, TRIG, CHOLHDL, LDLDIRECT in the last 72 hours. Thyroid Function Tests: No results for input(s): TSH, T4TOTAL, FREET4, T3FREE, THYROIDAB in the last 72 hours. Anemia Panel: No results for input(s): VITAMINB12, FOLATE, FERRITIN, TIBC, IRON, RETICCTPCT in the last 72 hours. Sepsis Labs: No results for input(s): PROCALCITON, LATICACIDVEN in the last 168 hours.  Recent Results (from the past 240 hour(s))  Culture, Urine     Status: None   Collection Time: 08/04/19 12:48 PM   Specimen: Urine, Random  Result Value Ref Range Status   Specimen Description  URINE, RANDOM  Final   Special Requests NONE  Final   Culture   Final    NO GROWTH Performed at [facility], [address] [place], [place] [address]    Report Status 08/05/2019 FINAL  Final  Culture, blood (routine x 2)     Status: None  Collection Time: 08/04/19  6:59 PM   Specimen: BLOOD LEFT ARM  Result Value Ref Range Status   Specimen Description BLOOD LEFT ARM  Final   Special Requests   Final    BOTTLES DRAWN AEROBIC ONLY Blood Culture results may not be optimal due to an inadequate volume of blood received in culture bottles   Culture   Final    NO GROWTH 5 DAYS Performed at [facility], [place] [address] [place], [place] [address]    Report Status 08/09/2019 FINAL  Final  Culture, blood (routine x 2)     Status: None   Collection Time: 08/04/19  6:59 PM   Specimen: BLOOD LEFT HAND  Result Value Ref Range Status   Specimen Description BLOOD LEFT HAND  Final   Special Requests   Final    BOTTLES DRAWN AEROBIC ONLY Blood Culture results may not be optimal due to an inadequate volume of blood received in culture bottles   Culture   Final    NO GROWTH 5 DAYS Performed at [facility], [place] [address] [place], [place] [address]    Report Status 08/09/2019 FINAL  Final  Body fluid culture     Status: None   Collection Time: 08/05/19 11:33 AM   Specimen: Body Fluid  Result Value Ref Range Status   Specimen Description FLUID ABDOMEN  Final   Special Requests NONE  Final   Gram Stain   Final    RARE WBC PRESENT,BOTH PMN AND MONONUCLEAR NO ORGANISMS SEEN    Culture   Final    NO GROWTH 3 DAYS Performed at [facility], [address], [place], [place] [address]    Report Status 08/08/2019 FINAL  Final  Culture, blood (routine x 2)     Status: None   Collection Time: 08/06/19  4:30 PM   Specimen: BLOOD  Result Value Ref Range Status   Specimen Description BLOOD LEFT ANTECUBITAL  Final   Special Requests AEROBIC BOTTLE ONLY Blood Culture  adequate volume  Final   Culture   Final    NO GROWTH 5 DAYS Performed at [place] [address], [place], [place] [address]    Report Status 08/11/2019 FINAL  Final  Culture, blood (routine x 2)     Status: None   Collection Time: 08/06/19  4:42 PM   Specimen: BLOOD LEFT HAND  Result Value Ref Range Status   Specimen Description BLOOD LEFT HAND  Final   Special Requests NONE  Final   Culture   Final    NO GROWTH 5 DAYS Performed at [facility], [place] [address] [place], [place] [address]    Report Status 08/11/2019 FINAL  Final    Radiology Studies: Ct Head Wo Contrast  Result Date: 08/11/2019 CLINICAL DATA:  Cerebral hemorrhage suspected. History of stroke, follow-up. EXAM: CT HEAD WITHOUT CONTRAST TECHNIQUE: Contiguous axial images were obtained from the base of the skull through the vertex without intravenous contrast. COMPARISON:  Head CTs dated 08/01/2019, 07/30/2019 and 07/23/2019 FINDINGS: Brain: Again noted is the RIGHT frontal-parietal-temporal craniectomy. There is stable herniation of infarcted brain through the craniectomy site. The previously described hematoma anteriorly within the infarcted RIGHT frontal lobe has decreased in size and density, compatible with expected evolution, now measuring 2 cm greatest dimension. There are new patchy parenchymal hemorrhages along the periphery of the infarcted brain, compatible with hemorrhagic conversion, largest underlying the RIGHT frontal bone measures 1.3 cm greatest  dimension. No additional mass effect or midline shift on today's exam. No LEFT sided intracranial hemorrhage or edema appreciated. Ventricles are stable in size and configuration. Vascular: No hyperdense vessel or unexpected calcification. Skull: As above. Sinuses/Orbits: No acute finding. Other: None. IMPRESSION: 1. New patchy parenchymal hemorrhages along the periphery of the infarcted RIGHT frontoparietal lobe, compatible with hemorrhagic conversion,  largest focus underlying the RIGHT frontal bone measures 1.3 cm greatest dimension. 2. Previously described dominant focus of hemorrhage along the anterior margin of the craniectomy site has decreased in size and density compared to previous exams, compatible with expected evolution. 3. Stable herniation of infarcted brain through the RIGHT frontal-parietal-temporal craniectomy site. 4. No additional mass effect or midline shift on today's exam. Electronically Signed   By: Franki Cabot M.D.   On: 08/11/2019 12:27   US Abdomen Limited  Result Date: 08/11/2019 CLINICAL DATA:  RIGHT lower quadrant postop wound swelling. Hematoma? EXAM: ULTRASOUND ABDOMEN LIMITED COMPARISON:  None. FINDINGS: The area swelling in the lower RIGHT abdomen corresponds to the location of patient's skull status post partial craniectomy. There is a mildly complex fluid collection surrounding this portion of patient's skull, the fluid collection measuring 7.9 x 3 x 7.3 cm. IMPRESSION: Suspected mildly complex seroma versus liquified hematoma in the superficial soft tissues of the RIGHT abdomen, measuring 7.9 cm greatest dimension, surrounding patient's calvarium (location of patient's skull status post partial craniectomy), appearance less suggestive of abscess. The collection seen today by ultrasound is similar to the appearance of the collection on earlier CT abdomen of 08/04/2019. Electronically Signed   By: Franki Cabot M.D.   On: 08/11/2019 13:54   Scheduled Meds: . baclofen  10 mg Oral TID  . bromocriptine  5 mg Oral BID  . busPIRone  30 mg Oral TID  . chlorhexidine  15 mL Mouth Rinse BID  . Chlorhexidine Gluconate Cloth  6 each Topical Daily  . famotidine  20 mg Per Tube BID  . feeding supplement (ENSURE ENLIVE)  237 mL Oral BID BM  . feeding supplement (PRO-STAT SUGAR FREE 64)  30 mL Per Tube TID  . free water  150 mL Per Tube Q8H  . levETIRAcetam  500 mg Per Tube BID  . mouth rinse  15 mL Mouth Rinse q12n4p  .  metoprolol tartrate  50 mg Per Tube Q8H  . sodium chloride flush  10-40 mL Intracatheter Q12H   Continuous Infusions: . sodium chloride 10 mL/hr at 08/01/19 1715  . sodium chloride 50 mL/hr at 08/11/19 1034  . feeding supplement (OSMOLITE 1.5 CAL) Stopped (08/11/19 0800)  . heparin 1,250 Units/hr (08/11/19 1000)     LOS: 23 days   Roxan Hockey, MD [facility] If 7PM-7AM, please contact night-coverage [email or web address] Password [facility] 08/11/2019, 5:37 PM

## 2019-08-11 NOTE — Progress Notes (Signed)
Just spoke with Dr. Roxan Hockey and notified him of resulted Heat CT and Abdominal ultrasound. Dr. Denton Brick will reach out to Dr. Antony Contras regarding Heat CT results.  I also notified Dr. Denton Brick of a left penile nodule that was noted when exchanging condom catheters.   Will continue to monitor.   Dewaine Oats, RN

## 2019-08-12 DIAGNOSIS — J9600 Acute respiratory failure, unspecified whether with hypoxia or hypercapnia: Secondary | ICD-10-CM

## 2019-08-12 LAB — GLUCOSE, CAPILLARY
Glucose-Capillary: 111 mg/dL — ABNORMAL HIGH (ref 70–99)
Glucose-Capillary: 102 mg/dL — ABNORMAL HIGH (ref 70–99)
Glucose-Capillary: 102 mg/dL — ABNORMAL HIGH (ref 70–99)
Glucose-Capillary: 110 mg/dL — ABNORMAL HIGH (ref 70–99)
Glucose-Capillary: 103 mg/dL — ABNORMAL HIGH (ref 70–99)
Glucose-Capillary: 104 mg/dL — ABNORMAL HIGH (ref 70–99)

## 2019-08-12 LAB — HEPARIN LEVEL (UNFRACTIONATED)
Heparin Unfractionated: 1.9 IU/mL — ABNORMAL HIGH (ref 0.30–0.70)
Heparin Unfractionated: 0.28 IU/mL — ABNORMAL LOW (ref 0.30–0.70)
Heparin Unfractionated: 0.36 IU/mL (ref 0.30–0.70)

## 2019-08-12 LAB — COMPREHENSIVE METABOLIC PANEL
ALT: 164 U/L — ABNORMAL HIGH (ref 0–44)
AST: 85 U/L — ABNORMAL HIGH (ref 15–41)
Albumin: 2.5 g/dL — ABNORMAL LOW (ref 3.5–5.0)
Alkaline Phosphatase: 76 U/L (ref 38–126)
Anion gap: 3 — ABNORMAL LOW (ref 5–15)
BUN: 14 mg/dL (ref 6–20)
CO2: 28 mmol/L (ref 22–32)
Calcium: 8.2 mg/dL — ABNORMAL LOW (ref 8.9–10.3)
Chloride: 104 mmol/L (ref 98–111)
Creatinine, Ser: 0.76 mg/dL (ref 0.61–1.24)
GFR calc Af Amer: >60 mL/min (ref >60)
GFR calc non Af Amer: >60 mL/min (ref >60)
Glucose, Bld: 117 mg/dL — ABNORMAL HIGH (ref 70–99)
Potassium: 3.7 mmol/L (ref 3.5–5.1)
Sodium: 135 mmol/L (ref 135–145)
Total Bilirubin: 0.5 mg/dL (ref 0.3–1.2)
Total Protein: 6.4 g/dL — ABNORMAL LOW (ref 6.5–8.1)

## 2019-08-12 LAB — CBC
HCT: 29.8 % — ABNORMAL LOW (ref 39.0–52.0)
Hemoglobin: 9.5 g/dL — ABNORMAL LOW (ref 13.0–17.0)
MCH: 28 pg (ref 26.0–34.0)
MCHC: 31.9 g/dL (ref 30.0–36.0)
MCV: 87.9 fL (ref 80.0–100.0)
Platelets: 306 10*3/uL (ref 150–400)
RBC: 3.39 MIL/uL — ABNORMAL LOW (ref 4.22–5.81)
RDW: 14.2 % (ref 11.5–15.5)
WBC: 10 10*3/uL (ref 4.0–10.5)
nRBC: 0 % (ref 0.0–0.2)

## 2019-08-12 LAB — CK: Total CK: 453 U/L — ABNORMAL HIGH (ref 49–397)

## 2019-08-12 MED ORDER — BROMOCRIPTINE MESYLATE 2.5 MG PO TABS
5.0000 mg | ORAL_TABLET | Freq: Four times a day (QID) | ORAL | Status: DC
  Administered 2019-08-12 – 2019-08-14 (×7): 5 mg via ORAL
  Filled 2019-08-12 (×12): qty 2

## 2019-08-12 NOTE — Progress Notes (Signed)
STROKE TEAM PROGRESS NOTE   INTERVAL HISTORY Patient has been afebrile overnight.  Neurologically is unchanged and is awake arousable follows commands well on the right side.    Patient has been eating well during the day and he gets supplementary tube feeds overnight.Plan to transfer to neurology floor today if bed available.  Lab work shows elevated liver enzymes with normal bilirubin.  He remains on heparin drip and hematocrit is stable  Vitals:   08/12/19 0900 08/12/19 1000 08/12/19 1100 08/12/19 1145  BP: (!) 120/58 118/78 116/79   Pulse: 96  93   Resp: 16  (!) 23   Temp:    98.8 F (37.1 C)  TempSrc:    Oral  SpO2: 100% 100% 100%   Weight:      Height:        CBC:  Recent Labs  Lab 08/10/19 0618 08/12/19 0331  WBC 10.7* 10.0  HGB 10.0* 9.5*  HCT 29.4* 29.8*  MCV 84.7 87.9  PLT 282 [id number]    Basic Metabolic Panel:  Recent Labs  Lab 08/06/19 0635 08/07/19 0300 08/09/19 0213 08/12/19 0331  NA 136 136 138 135  K 3.6 3.6 3.7 3.7  CL 103 106 105 104  CO2 23 24 25 28   GLUCOSE 134* 123* 112* 117*  BUN 19 17 14 14   CREATININE 0.86 0.77 0.79 0.76  CALCIUM 8.4* 8.2* 8.5* 8.2*  MG 2.0 2.1  --   --   PHOS 3.2 3.2  --   --     IMAGING Ct Head Wo Contrast Ct Cervical Spine Wo Contrast  07/19/2019 1132 1. 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 2. No associated hemorrhage and mild intracranial mass effect at this time, including trace leftward midline shift. 3. Evidence of large vessel occlusion: Hyperdensity of the right ICA terminus, the right A1 and MCA. 4. Unaffected brain parenchyma appears negative. 5.  No acute traumatic injury identified in the cervical spine.   Mr Brain 65 Contrast Mr Angio Head Wo Contrast Mr Angio Neck Wo Contrast 07/19/2019 1330 1. 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 The right ICA terminus, right ACA and MCA also appear occluded. 2. Large right hemisphere infarct mostly sparing  the right PCA territory. Cytotoxic edema but no convincing hemorrhage. Stable mild mass effect with trace leftward midline shift. 3. Intracranial MRA is degraded by motion artifact, but no other large vessel occlusion is suspected. There appears to be lobes reperfusion of the right PCA. 4. The left hemisphere and posterior fossa brain parenchyma appears normal.   Ct Head Wo Contrast 07/19/2019 1532 Similar appearance to earlier. Low-density and swelling of the right hemisphere in the ACA and MCA territories with mass effect and right-to-left shift of 5-6 mm. No hemorrhagic transformation at this time.   Ct Head Wo Contrast 07/20/2019 0444 1. Acute right ACA and MCA territory infarct with progressive swelling that has decompressed through the craniectomy defect. 2. No acute hemorrhage or new infarction.   Ct Angio Head W Or Wo Contrast Ct Angio Neck W Or Wo Contrast 07/21/2019 1. Stable from prior MRA. 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 No evidence of atherosclerosis or vasculopathy in the other vessels. 2. Cytotoxic edema causes 5 mm of midline shift and brain bulging through the craniectomy defect. Mild petechial hemorrhage is seen at the basal ganglia.   Ct Head Wo Contrast 07/23/2019 1.  Unchanged appearance of massive right MCA and ACA territory infarcts with parenchyma extending through decompressive craniectomy. 2. Small focus of suspected hemorrhage adjacent to the right caudate head. 07/30/2019 1. Decreasing mass effect within large right anterior MCA and ACA territory infarct. 2. Midline shift is no longer present. There is decreased effacement of the right lateral ventricle. 3. Infarcted brain tissue still herniates through the craniectomy site. 4. New parenchymal hemorrhage within the infarcted tissue anteriorly measures 2.5 x 2.3 x 2.7 cm. 5. No new infarct. 08/01/2019  1. Stable head CT since 07/30/2019. 2. Again noted is a large  infarct involving the right MCA and right ACA territories with brain tissue herniating through the right craniotomy defect. 3. Parenchymal hemorrhage in the right anterior cortex has minimally changed. Stable petechial hemorrhage in the right basal ganglia region.  08/11/2019 1. New patchy parenchymal hemorrhages along the periphery of the infarcted RIGHT frontoparietal lobe, compatible with hemorrhagic conversion, largest focus underlying the RIGHT frontal bone measures 1.3 cm greatest dimension. 2. Previously described dominant focus of hemorrhage along the anterior margin of the craniectomy site has decreased in size and density compared to previous exams, compatible with expected evolution. 3. Stable herniation of infarcted brain through the RIGHT frontal-parietal-temporal craniectomy site. 4. No additional mass effect or midline shift on today's exam.  Dg Chest Port 1 View 08/04/2019 Well-positioned support structures. No active cardiopulmonary disease.  08/01/2019 No acute disease   Ct Angio Chest Pe W Or Wo Contrast 08/01/2019 Nonocclusive thrombus seen within the left posterior lower lobe segmental artery.    [place] ABD Limited 08/11/2019 Suspected mildly complex seroma versus liquified hematoma in the superficial soft tissues of the RIGHT abdomen, measuring 7.9 cardiomyopathy greatest dimension, surrounding patient's calvarium (location of patient's skull status post partial craniectomy), appearance less suggestive of abscess. The collection seen today by ultrasound is similar to the appearance of the collection on earlier CT abdomen of 08/04/2019.  2D Echocardiogram 07/20/2019  1. The left ventricle has normal systolic function with an ejection fraction of 60-65%. The cavity size was normal. Left ventricular diastolic parameters were normal.  2. The right ventricle has normal systolic function. The cavity was normal. There is no increase in right ventricular wall thickness.  3. The  pericardial effusion is circumferential.  4. Trivial pericardial effusion is present.  5. The mitral valve is grossly normal.  6. The tricuspid valve is grossly normal.  7. The aortic valve is tricuspid. No stenosis of the aortic valve.  8. The aorta is normal unless otherwise noted.  9. The aortic root is normal in size and structure. 10. No cardiac source of embolism identified. 11. When compared to the prior study: No comparison.  LE Dopplers 08/02/2019 Right: Findings consistent with acute deep vein thrombosis involving the right peroneal veins.  Left: Findings consistent with acute deep vein thrombosis involving the left common femoral vein, left femoral vein, left proximal profunda vein, left popliteal vein, left posterior tibial veins, and left peroneal veins. Extending up into left iliac vein  and IVC.  07/21/2019 Right: Findings consistent with acute deep vein thrombosis involving the right peroneal veins. Left: Findings consistent with acute deep vein thrombosis involving the left popliteal vein, left posterior tibial veins, and left peroneal veins.  Vas [place] Transcranial Doppler W Bubbles 07/25/2019 No HITS heard heard at rest. No HITS heard heard during valsalva.   2D echo w/bubble 08/03/2019 Limited bubble study for shunt. No evidence for atrial level right to left shunt.  Ct Abdomen  Pelvis W Contrast 08/04/2019 1. Large fluid collection in the lower right anterior abdominal wall subcutaneous fat with what appears to be an internal surgical drain, favored to represent a postoperative hematoma or proteinaceous seroma, however, the possibility of an abscess is not excluded in light of the patient's fever.  2. Small high attenuation fluid collection along the left pelvic sidewall which likely represents a hematoma, as above.  3. Additional incidental findings, as above.   PHYSICAL EXAM     General- Well nourished, well developed, middle-aged African-American male. He has right  hemicraniectomy surgical incision on the scalp.  Ophthalmologic- fundi not visualized due to noncooperation.  Cardiovascular - Regular rhythm, tachycardia.  Neuro - patient is awake,  . Nonverbal, but able to follow all simple commands on the right hand and foot. Eyes in right gaze position, barely cross midline, not blinking to visual threat to the left, PERRL. Left facial droop. Tongue protrusion not corporative. Has spontaneous movement of RUE against gravity 4/5 at least and RLE in bed 2+/5 at least. On pain stimulation, mild withdraw of LLE, but no movement of LUE. DTR 1+ and no babinski. Sensation, coordination and gait not tested.  ASSESSMENT/PLAN [name] is a 43 y.o. male with no significant past medical history found down x 2 days nonverbal with L hemiparesis.   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 Trace L midline shift. ELVO at R ICA, R A1, R MCA.  MRI  Large R brain infarct sparing R PCA. Cytotoxic edema but no hemorrhage. Stable trace midline shift.  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 R ICA terminus, R ACA, R MCA occluded.   CT head similar w/ low density and sweddling R ACA and MCA territories, now with 5-54mm midline shift. no hemorrhage  CT head acute R ACA and MCA infarct w/ progressive swelling decompressed through craniectomy defect.   CTA head and neck stable MLS. R ICA occlusion in neck that continues into R ACA and MCA. Mild petechial hemorrhage at basal ganglia  CT Head 8/29 - Unchanged appearance of massive right MCA and ACA territory infarcts with parenchyma extending through decompressive craniectomy. Small focus of suspected hemorrhage adjacent to the right caudate head.  CT Head WO -  07/30/19 - New hematoma within the infarcted tissue   CT repeat 08/01/2019 stable w/o change  CT head repeat 08/11/19 - new patchy hemorrhage along periphery of infarcted R frontoparietal lobe (likely HT) decreased dominant anterior margin hemorrhage. Stable herniation of infarcted brain through crani site.   2D Echo EF 60-65%  2D echo w/ bubble ordered no LRS   LE venous doppler DVT in R peroneal, L popliteal, L posterior tibial and L peroneal  LE venous doppler repeat DVT in R peroneal, L popliteal, L posterior tibial and L peroneal  CT chest Nonocclusive thrombus seen within the left posterior lower lobe segmental artery.   TCD bubble no HITS, no PFO  LDL 83  HgbA1c 5.4  UDS positive THC  Hypercoagulable and autoimmune work up negative  Heparin 1500 subq for VTE prophylaxis  No antithrombotic prior to admission, treated DVT with heparin IV, then  discontinued due to hemorrhagic conversion with right frontal hematoma, now resumed heparin IV given  extensive DVT and PE  Therapy recommendations:  SNF (reassessed by CIR and declined 9/16)  Disposition:  pending   Cyctotoxic cerebral edema  S/p R decompressive hemicraniectomy (Nundkumar) w/ flap R abd 07/19/2019  PICC placed - keep for now per Matthew Black  Given One dose of 23.4% 8/26; 3%  off 8/30 1700  Na 136  On NS to 75cc and decrease free water to 150 Q8  Monitor Na daily  CT repeat 07/30/19 - New parenchymal hemorrhage within the infarcted tissue  Off helmet to further release pressure  CT repeat 08/01/2019 stable w/o change  CT head repeat 08/11/19 - new patchy hemorrhage along periphery of infarcted R frontoparietal lobe (likely HT) but previous right frontal hematoma now much reduced in size  ongoing neuro check since on heparin IV  Right ICA occlusion  MRA and CTA showed right ICA occlusion from origin to terminal  Wife denies any head trauma  Wife stated that pt had recent aggressive exercise with weight  lifting  Concerning dissection as working diagnosis of stroke etiology   B LE DVT Small LLL PE  LE venous doppler DVT in R peroneal, L popliteal, L posterior tibial and L peroneal  Etiology unclear  Started IV heparin 07/26/2019  IVC filter placed 8/27. Plan retrieval in 8-12 weeks as an IP in an IR clinic  Hypercoagulable work up negative   Off SCDs  LE venous doppler 08/02/19 repeat extensive DVT in R peroneal, L popliteal, L posterior tibial and L peroneal up to the IVC filter  CT chest Nonocclusive thrombus seen within the left posterior lower lobe segmental artery.   hydrated w/ IVF @ 75cc, continue free water and TF  Treated with IV Heparin but then off secondary to intracranial hemorrhage  Restarted IV heparin given extensive DVT and PE with close neuro monitoring. Pt remains stable.  Hematology consult, appreciate help - hypercoag w/u neg.  At risk for recurrent clots. AC recommended w/ lovenox at d/c.  Factor V Leiden, homocysteine, Prot C&S, Prothombin gene mutation  All neg.   Appreciate TRH assistance on this complicated case  Abdominal wall hematoma  Large fluid collection seen on CT abdomen  CT abd/pel concerning for abdominal wall fluid collection likely old hematoma  Dr. Kathyrn Sheriff did fluid aspiration and seems to be old blood component  Fluid aspiration sent for culture 9/11 - no growth < 24 hrs  Repeat ABD [place] complex seroma vs liquified hematoma R abd similar to previous  requested ID consult   Seizure-like activity  EEG continuous slowing, excessive beta activity, sleep spindle asymmetry decreased R->related to stroke and sedation. No SZ  Long Term EEG cortical dysfunction in right frontal region  Seizure precautions  Continue Keppra   SIRS/fever/tachycardia/tachypnea/leukocytosis - central neurogenic fever  Tmax - 101.7   WBC - 10.7  CXR x 2 NAD  U/A x 3 - negative. UCx neg   Blood cultures - neg, repeated again 08/04/19 -> no growth  on empiric unasyn -> changed to vanco, cefepime and flagyl 08/05/19  Lactic acid 1.2->2.0  Hepatitis panel 9/12 - neg  On baclofen 10 mg tid and and Bromocriptine 5 mg bid for central neurogenic fever   change bromocriptiine to qid  Tachycardia  HR 98 - 120  likely due to fever and dehydration and PE  Hydration with IVF, TF and free water  Increase metoprolol to 50 Q8h  EKG ST, rate 120 w/ possible LA enlargement  Troponin series neg x 3    Still  more likely due to fever and SIRS  Hyperlipidemia  Home meds:  no statin  LDL 83, goal < 70  Was on lipitor 20 mg daily   D/c statin due to elevated LFT  Dysphagia . Secondary to stroke . NPO  Cortrak w/ TF @ 55cc  Decreased free water 150cc q4->q8   Speech on board   May need PEG, wife want to wait for a new more days  Other Stroke Risk Factors  Former Cigarette smoker, quit 3 yrs ago  ETOH use  Substance abuse UDS - positive for THC   Obesity, Body mass index is 31.64 kg/m., recommend weight loss, diet and exercise as appropriate   Other Active Problems  Elevated CK/Elevated liver enzymes - AST - 98; ALT - 65 - improving;CK 852 Hepatitis panel 9/12 - neg  Hypokalemia, resolved - 3.7  Normocytic anemia - hematology felt d/t ongoing blood draws 10.0  Hospital day # 24 Recommend increase bromocriptine dose to 5 mg twice daily to help with central neurogenic fever.  Continue IV heparin despite new small hematomas as clinically his neurological exam is stable despite this.  Continue to encourage oral intake and may consider discontinuing panda tube in a couple of days.  Transfer out of ICU to neurology floor bed in the next couple of days if he remains stable.  I called the patient's wife and left a message on the answering machine to call me back to discuss his care. Transfer out of ICU today/ Discussed with Dr. Joesph Fillers medical hospitalist and prefer patient be transferred to medical hospitalist team as  primary attending given his significant ongoing medical problems and stroke team will continue to follow on a daily basis.  Greater than 50% time during this 35-minute visit was spent on counseling and coordination of care about his intracerebral hemorrhage, DVT, pulmonary embolism and management of neurogenic fever.   Antony Contras, MD  To contact Stroke Continuity provider, please refer to [email or web address] After hours, contact General Neurology

## 2019-08-12 NOTE — Progress Notes (Signed)
PROGRESS NOTE    Matthew Black  [id number] DOB: [birth year] DOA: 07/19/2019 PCP: Colon Branch, MD   Brief Narrative:  Matthew Duque Tuckeris an 43 y.o.malewithout significant known PMH who presented on 8/25 with AMS as well as L hemiparesis and aphasia. UDS + for THC. CT with large R hemispheric infarct with confluent cytotoxic edema in R ACA and MCA territories and mild mass effect. 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 He was admitted to [facility] with neurology consultation and started on 3% saline. Neurosurgery was consulted and Dr. Kathyrn Sheriff recommended early decompressive hemicraniectomy. He subsequently developed hemorrhagic conversion with hematoma on 9/5; helmet was removed to further release intracranial pressure and heparin was discontinued. LE doppler also indicated DVT in R peroneal, L popliteal, L posterior tibial, and L peroneal veins; IVC filter was placed. Patient had seizure-like activity, for which he is on Keppra. He was subsequently found to have possible RLL infiltrate and with concern for aspiration he was made NPO and started on Unasyn. CTA was performed and it showed a non-occlusive LLL thrombus on 9/7.Subsequent repeat DVT US was performed 9/8 and showed acute R DVT of the peroneal veins as well as acute L DVT involving common femoral vein, L femoral vein, L proximal profunda vein, L popliteal vein, L posterior tibial veins, and L peroneal veins - and it extends up into the left iliac vein and IVC.  CT abdomen showed large fluid collection in lower right anterior abdominal which could be hematoma, seroma or an abscess.  Neurosurgery consulted and tapped the abdominal hematoma. Hospital course remarkable for persistent fever, Cultures negative so far.  Antibiotics have been stopped .  Assessment & Plan:   Active Problems:   Stroke (cerebrum) (HCC)   Pressure injury of skin   Acute respiratory failure (HCC)   Endotracheal tube present   Deep  vein thrombosis (DVT) of non-extremity vein   Hypokalemia   FUO (fever of unknown origin)   Acute blood loss anemia   SIRS (systemic inflammatory response syndrome) (HCC)   Leukocytosis   Primary hypercoagulable state ([place])   Acute pulmonary embolism without acute cor pulmonale (HCC)   Right MCA/ACA infarct with left hemiparesis: -Mental status continues to improve, able to follow simple commands - encephalopathy due to massive CVA.  Has right ICA, MCA and ACA occlusions.  Status post hemicraniectomy with abdominal flap implant.  Hemorrhagic conversion of CVA when he was started on heparin for DVT.  Heparin has been resumed.  Neurology/neurosurgery managing.  -d/w Sethi--- CT head from 08/11/2019 reviewed by Dr. Viviana Simpler per Dr. Leonie Man okay to continue IV heparin, may transition to p.o. Eliquis at 5 mg p.o. twice daily without loading dose upon discharge  Bilateral Lower Extremity DVT (Lt > Rt)/Non-occlusive left lower lobe PE: Currently on heparin.  Also has IVC filter.  Stable abdominal wall hematoma under the abdominal flap incision site.  Has edema of the left lower extremity.  Seizure-like activity: On Keppra as per neurology.  Persistent Fever:[place] Kitchen  Extensive work-up did not reveal any clear source of infection.  Suspected to be  associated  with hemorrhagic CVA. Antibiotics have been stopped.   -No definite source for infection  -??? query CNS /central fever Chills resolved with bromocriptine. Fever is resolving  Abdominal hematoma: CT abdomen showed large fluid collection in lower right anterior abdominal which could be hematoma, seroma .  Neurosurgery consulted and tapped the abdominal hematoma.  Cultures negative so far. -Due to persistent fevers  we obtained repeat abdominal ultrasound on 08/11/2019 suggest seroma or organizing hematoma, abscess is thought to be less likely  Elevated CK/Elevated liver enzymes: he was Found on the floor PTA. CK elevated  due to rhabdomyolysis and is  improving with IV fluids.  Also has mild elevated liver enzymes.  Viral Hepatitis panel negative  Normocytic anemia: Currently H&H stable.  Dysphagia: Encephalopathy due to massive CVA.  On feeding tube.Speech following ,underwent MBS.Started on dysphagia 1 diet. -Continue to feed orally during the day and continue tube feeding overnight  Nutrition Problem: Inadequate oral intake Etiology: acute illness  DVT prophylaxis: Heparin IV  Code Status: Full Code  Family Communication:  Discussed with Ms Matthew Black [phone]  Disposition Plan: As per neurology.Likely SNF/CIR    Procedures: Hemicraniectomy  Antimicrobials:  Anti-infectives (From admission, onward)   Start     Dose/Rate Route Frequency Ordered Stop   08/05/19 1000  vancomycin (VANCOCIN) 1,250 mg in sodium chloride 0.9 % 250 mL IVPB  Status:  Discontinued     1,250 mg 166.7 mL/hr over 90 Minutes Intravenous Every 12 hours 08/04/19 1856 08/08/19 0914   08/05/19 0600  ceFEPIme (MAXIPIME) 2 g in sodium chloride 0.9 % 100 mL IVPB  Status:  Discontinued     2 g 200 mL/hr over 30 Minutes Intravenous Every 8 hours 08/04/19 1856 08/08/19 0914   08/04/19 2200  metroNIDAZOLE (FLAGYL) IVPB 500 mg  Status:  Discontinued     500 mg 100 mL/hr over 60 Minutes Intravenous Every 8 hours 08/04/19 1743 08/06/19 1548   08/04/19 1830  vancomycin (VANCOCIN) 2,000 mg in sodium chloride 0.9 % 500 mL IVPB     2,000 mg 250 mL/hr over 120 Minutes Intravenous  Once 08/04/19 1805 08/04/19 2053   08/04/19 1815  ceFEPIme (MAXIPIME) 2 g in sodium chloride 0.9 % 100 mL IVPB     2 g 200 mL/hr over 30 Minutes Intravenous  Once 08/04/19 1805 08/04/19 1842   07/31/19 2200  Ampicillin-Sulbactam (UNASYN) 3 g in sodium chloride 0.9 % 100 mL IVPB  Status:  Discontinued     3 g 200 mL/hr over 30 Minutes Intravenous Every 6 hours 07/31/19 1401 08/04/19 1743   07/31/19 1415  Ampicillin-Sulbactam (UNASYN) 3 g in sodium chloride 0.9 % 100 mL IVPB     3 g  200 mL/hr over 30 Minutes Intravenous STAT 07/31/19 1401 07/31/19 1551   07/19/19 2315  bacitracin 50,000 Units in sodium chloride 0.9 % 500 mL irrigation  Status:  Discontinued       As needed 07/19/19 2315 07/19/19 2339      Subjective:  -Resting comfortably, tolerating oral intake Significant other at bedside, questions answered  Objective: Vitals:   08/12/19 1500 08/12/19 1528 08/12/19 1600 08/12/19 1700  BP: 122/82  119/83 132/78  Pulse: 89  88 93  Resp: 18  17 19   Temp:  99.2 F (37.3 C)    TempSrc:  Oral    SpO2: 100%  100% 100%  Weight:      Height:        Intake/Output Summary (Last 24 hours) at 08/12/2019 1721 Last data filed at 08/12/2019 1700 Gross per 24 hour  Intake 2831.34 ml  Output 4595 ml  Net -1763.66 ml   Filed Weights   08/10/19 0500 08/11/19 0500 08/12/19 0500  Weight: 106.5 kg 105.4 kg 102.9 kg    Examination:  General exam: Resting comfortably HEENT: Eyes open,Oral mucosa moist,   feeding tube, staples on the scalp Cortrack  tube in the left Nare respiratory system: Bilateral equal air entry, normal vesicular breath sounds, no wheezes or crackles  Cardiovascular system: S1 & S2 heard, RRR. No JVD, murmurs, rubs, gallops or clicks. Gastrointestinal system: Abdomen is nondistended, soft and nontender.  Normal bowel sounds heard.  Staples on the right lower quadrant with area of swelling around the staples (part of his cranium is located at this site) Central nervous system: Awake, apparently able to follow some commands, apparently able to take oral intake , left hemiparesis Extremities: Left lower extremity edema Skin: No rashes, lesions or ulcers,no icterus ,no pallor GU-left superior aspect of scrotum with the sebaceous cyst that does not look inflamed or infected  Data Reviewed:   CBC: Recent Labs  Lab 08/07/19 0300 08/08/19 0530 08/09/19 0213 08/10/19 0618 08/12/19 0331  WBC 15.1* 11.2* 10.0 10.7* 10.0  HGB 9.7* 9.0* 10.0* 10.0*  9.5*  HCT 29.9* 28.4* 30.4* 29.4* 29.8*  MCV 86.7 87.1 86.4 84.7 87.9  PLT 282 274 278 282 [id number]   Basic Metabolic Panel: Recent Labs  Lab 08/06/19 0635 08/07/19 0300 08/09/19 0213 08/12/19 0331  NA 136 136 138 135  K 3.6 3.6 3.7 3.7  CL 103 106 105 104  CO2 23 24 25 28   GLUCOSE 134* 123* 112* 117*  BUN 19 17 14 14   CREATININE 0.86 0.77 0.79 0.76  CALCIUM 8.4* 8.2* 8.5* 8.2*  MG 2.0 2.1  --   --   PHOS 3.2 3.2  --   --    GFR: Estimated Creatinine Clearance: 148.3 mL/min (by C-G formula based on SCr of 0.76 mg/dL). Liver Function Tests: Recent Labs  Lab 08/06/19 0635 08/07/19 0300 08/09/19 0213 08/12/19 0331  AST 105* 118* 98* 85*  ALT 68* 60* 65* 164*  ALKPHOS 85 79 74 76  BILITOT 0.6 1.2 0.6 0.5  PROT 7.3 6.5 6.6 6.4*  ALBUMIN 2.7* 2.6* 2.5* 2.5*   No results for input(s): LIPASE, AMYLASE in the last 168 hours. No results for input(s): AMMONIA in the last 168 hours. Coagulation Profile: No results for input(s): INR, PROTIME in the last 168 hours. Cardiac Enzymes: Recent Labs  Lab 08/08/19 0857 08/09/19 0213 08/10/19 0618 08/11/19 0338 08/12/19 0331  CKTOTAL 3,012* 2,063* 852* 595* 453*   BNP (last 3 results) No results for input(s): PROBNP in the last 8760 hours. HbA1C: No results for input(s): HGBA1C in the last 72 hours. CBG: Recent Labs  Lab 08/11/19 2333 08/12/19 0315 08/12/19 0729 08/12/19 1144 08/12/19 1526  GLUCAP 120* 111* 102* 102* 110*   Lipid Profile: No results for input(s): CHOL, HDL, LDLCALC, TRIG, CHOLHDL, LDLDIRECT in the last 72 hours. Thyroid Function Tests: No results for input(s): TSH, T4TOTAL, FREET4, T3FREE, THYROIDAB in the last 72 hours. Anemia Panel: No results for input(s): VITAMINB12, FOLATE, FERRITIN, TIBC, IRON, RETICCTPCT in the last 72 hours. Sepsis Labs: No results for input(s): PROCALCITON, LATICACIDVEN in the last 168 hours.  Recent Results (from the past 240 hour(s))  Culture, Urine     Status: None    Collection Time: 08/04/19 12:48 PM   Specimen: Urine, Random  Result Value Ref Range Status   Specimen Description URINE, RANDOM  Final   Special Requests NONE  Final   Culture   Final    NO GROWTH Performed at [facility], [address] [place], [place] [address]    Report Status 08/05/2019 FINAL  Final  Culture, blood (routine x 2)     Status: None  Collection Time: 08/04/19  6:59 PM   Specimen: BLOOD LEFT ARM  Result Value Ref Range Status   Specimen Description BLOOD LEFT ARM  Final   Special Requests   Final    BOTTLES DRAWN AEROBIC ONLY Blood Culture results may not be optimal due to an inadequate volume of blood received in culture bottles   Culture   Final    NO GROWTH 5 DAYS Performed at [facility], [place] [address], [place], [place] [address]    Report Status 08/09/2019 FINAL  Final  Culture, blood (routine x 2)     Status: None   Collection Time: 08/04/19  6:59 PM   Specimen: BLOOD LEFT HAND  Result Value Ref Range Status   Specimen Description BLOOD LEFT HAND  Final   Special Requests   Final    BOTTLES DRAWN AEROBIC ONLY Blood Culture results may not be optimal due to an inadequate volume of blood received in culture bottles   Culture   Final    NO GROWTH 5 DAYS Performed at [facility], [place] [address] [place], [place] [address]    Report Status 08/09/2019 FINAL  Final  Body fluid culture     Status: None   Collection Time: 08/05/19 11:33 AM   Specimen: Body Fluid  Result Value Ref Range Status   Specimen Description FLUID ABDOMEN  Final   Special Requests NONE  Final   Gram Stain   Final    RARE WBC PRESENT,BOTH PMN AND MONONUCLEAR NO ORGANISMS SEEN    Culture   Final    NO GROWTH 3 DAYS Performed at [facility], [address] [place], [place] [address]    Report Status 08/08/2019 FINAL  Final  Culture, blood (routine x 2)     Status: None   Collection Time: 08/06/19  4:30 PM   Specimen: BLOOD  Result Value Ref Range  Status   Specimen Description BLOOD LEFT ANTECUBITAL  Final   Special Requests AEROBIC BOTTLE ONLY Blood Culture adequate volume  Final   Culture   Final    NO GROWTH 5 DAYS Performed at [place] [address] [place], [place] [address]    Report Status 08/11/2019 FINAL  Final  Culture, blood (routine x 2)     Status: None   Collection Time: 08/06/19  4:42 PM   Specimen: BLOOD LEFT HAND  Result Value Ref Range Status   Specimen Description BLOOD LEFT HAND  Final   Special Requests NONE  Final   Culture   Final    NO GROWTH 5 DAYS Performed at [facility], [place] [address], [place], [place] [address]    Report Status 08/11/2019 FINAL  Final    Radiology Studies: Ct Head Wo Contrast  Result Date: 08/11/2019 CLINICAL DATA:  Cerebral hemorrhage suspected. History of stroke, follow-up. EXAM: CT HEAD WITHOUT CONTRAST TECHNIQUE: Contiguous axial images were obtained from the base of the skull through the vertex without intravenous contrast. COMPARISON:  Head CTs dated 08/01/2019, 07/30/2019 and 07/23/2019 FINDINGS: Brain: Again noted is the RIGHT frontal-parietal-temporal craniectomy. There is stable herniation of infarcted brain through the craniectomy site. The previously described hematoma anteriorly within the infarcted RIGHT frontal lobe has decreased in size and density, compatible with expected evolution, now measuring 2 cm greatest dimension. There are new patchy parenchymal hemorrhages along the periphery of the infarcted brain, compatible with hemorrhagic conversion, largest underlying the RIGHT frontal bone measures 1.3 cm greatest  dimension. No additional mass effect or midline shift on today's exam. No LEFT sided intracranial hemorrhage or edema appreciated. Ventricles are stable in size and configuration. Vascular: No hyperdense vessel or unexpected calcification. Skull: As above. Sinuses/Orbits: No acute finding. Other: None. IMPRESSION: 1. New patchy parenchymal  hemorrhages along the periphery of the infarcted RIGHT frontoparietal lobe, compatible with hemorrhagic conversion, largest focus underlying the RIGHT frontal bone measures 1.3 cm greatest dimension. 2. Previously described dominant focus of hemorrhage along the anterior margin of the craniectomy site has decreased in size and density compared to previous exams, compatible with expected evolution. 3. Stable herniation of infarcted brain through the RIGHT frontal-parietal-temporal craniectomy site. 4. No additional mass effect or midline shift on today's exam. Electronically Signed   By: Franki Cabot M.D.   On: 08/11/2019 12:27   US Abdomen Limited  Result Date: 08/11/2019 CLINICAL DATA:  RIGHT lower quadrant postop wound swelling. Hematoma? EXAM: ULTRASOUND ABDOMEN LIMITED COMPARISON:  None. FINDINGS: The area swelling in the lower RIGHT abdomen corresponds to the location of patient's skull status post partial craniectomy. There is a mildly complex fluid collection surrounding this portion of patient's skull, the fluid collection measuring 7.9 x 3 x 7.3 cm. IMPRESSION: Suspected mildly complex seroma versus liquified hematoma in the superficial soft tissues of the RIGHT abdomen, measuring 7.9 cm greatest dimension, surrounding patient's calvarium (location of patient's skull status post partial craniectomy), appearance less suggestive of abscess. The collection seen today by ultrasound is similar to the appearance of the collection on earlier CT abdomen of 08/04/2019. Electronically Signed   By: Franki Cabot M.D.   On: 08/11/2019 13:54   Scheduled Meds: . baclofen  10 mg Oral TID  . bromocriptine  5 mg Oral QID  . busPIRone  30 mg Oral TID  . chlorhexidine  15 mL Mouth Rinse BID  . Chlorhexidine Gluconate Cloth  6 each Topical Daily  . famotidine  20 mg Per Tube BID  . feeding supplement (ENSURE ENLIVE)  237 mL Oral BID BM  . feeding supplement (PRO-STAT SUGAR FREE 64)  30 mL Per Tube TID  . free  water  150 mL Per Tube Q8H  . levETIRAcetam  500 mg Per Tube BID  . mouth rinse  15 mL Mouth Rinse q12n4p  . metoprolol tartrate  50 mg Per Tube Q8H  . sodium chloride flush  10-40 mL Intracatheter Q12H   Continuous Infusions: . sodium chloride 10 mL/hr at 08/01/19 1715  . sodium chloride 50 mL/hr at 08/12/19 1700  . feeding supplement (OSMOLITE 1.5 CAL) 75 mL/hr at 08/12/19 0400  . heparin 1,400 Units/hr (08/12/19 1700)     LOS: 24 days   Roxan Hockey, MD [facility] If 7PM-7AM, please contact night-coverage [email or web address] Password [email or web address] 08/12/2019, 5:21 PM

## 2019-08-12 NOTE — Progress Notes (Signed)
ANTICOAGULATION CONSULT NOTE  Pharmacy Consult:  Heparin Indication:  DVT/PE  Allergies  Allergen Reactions  . Hydrocodone Nausea Only    Patient Measurements: Height: 5\' 11"  (180.3 cm) Weight: 232 lb 5.8 oz (105.4 kg) IBW/kg (Calculated) : 75.3 Heparin Dosing Weight: 96 kg  Vital Signs: Temp: 98.2 F (36.8 C) (09/18 0401) Temp Source: Oral (09/18 0401) BP: 119/87 (09/18 0400) Pulse Rate: 101 (09/18 0400)  Labs: Recent Labs    08/10/19 0618 08/11/19 0338 08/12/19 0331 08/12/19 0451  HGB 10.0*  --  9.5*  --   HCT 29.4*  --  29.8*  --   PLT 282  --  306  --   HEPARINUNFRC 0.34 0.41 1.90* 0.28*  CREATININE  --   --  0.76  --   CKTOTAL 852* 595* 453*  --     Estimated Creatinine Clearance: 150 mL/min (by C-G formula based on SCr of 0.76 mg/dL).   Assessment: 62 YOM presented with acute ischemic CVA and did not receive tPA.  There was an incidental finding of DVT and IVC filter was placed on 07/21/19 as patient was not a candidate for [facility] due to craniectomy.  He was cleared to start IV heparin, but then stopped on 07/30/19 due to right frontal lobe hemorrhage.  CTA on 08/01/19 showed non-occlusive thrombus and repeat Doppler on 08/02/19 positive for bilateral DVTs.  Heparin restarted on 08/02/19 given extensive DVTs and PE.    Heparin level slightly subtherapeutic (0.28) on gtt at 1250 units/hr. Initial heparin level (1.9) was drawn incorrectly from line where heparin running. No issues with line or bleeding reported per RN. Hgb down slightly to 9.5.  Goal of Therapy:  Heparin level 0.3-0.5 units/ml Monitor platelets by anticoagulation protocol: Yes    Plan:   Increase heparin to 1400 units/hr F/u 6 hr heparin level F/U plans for oral anticoag Monitor for s/sx of bleeding  Sherlon Handing, PharmD, BCPS 08/12/2019 5:32 AM

## 2019-08-12 NOTE — Progress Notes (Signed)
ANTICOAGULATION CONSULT NOTE  Pharmacy Consult:  Heparin Indication:  DVT/PE  Allergies  Allergen Reactions  . Hydrocodone Nausea Only    Patient Measurements: Height: 5\' 11"  (180.3 cm) Weight: 226 lb 13.7 oz (102.9 kg) IBW/kg (Calculated) : 75.3 Heparin Dosing Weight: 96 kg  Vital Signs: Temp: 98.8 F (37.1 C) (09/18 1145) Temp Source: Oral (09/18 1145) BP: 116/79 (09/18 1100) Pulse Rate: 93 (09/18 1100)  Labs: Recent Labs    08/10/19 0618 08/11/19 0338 08/12/19 0331 08/12/19 0451 08/12/19 1340  HGB 10.0*  --  9.5*  --   --   HCT 29.4*  --  29.8*  --   --   PLT 282  --  306  --   --   HEPARINUNFRC 0.34 0.41 1.90* 0.28* 0.36  CREATININE  --   --  0.76  --   --   CKTOTAL 852* 595* 453*  --   --     Estimated Creatinine Clearance: 148.3 mL/min (by C-G formula based on SCr of 0.76 mg/dL).   Assessment: 62 YOM presented with acute ischemic CVA and did not receive tPA.  There was an incidental finding of DVT and IVC filter was placed on 07/21/19 as patient was not a candidate for [facility] due to craniectomy.  He was cleared to start IV heparin, but then stopped on 07/30/19 due to right frontal lobe hemorrhage.  CTA on 08/01/19 showed non-occlusive thrombus and repeat Doppler on 08/02/19 positive for bilateral DVTs.  Heparin restarted on 08/02/19 given extensive DVTs and PE.    Hep gtt within goal  Cbc stable  Goal of Therapy:  Heparin level 0.3-0.5 units/ml Monitor platelets by anticoagulation protocol: Yes    Plan:   heparin 1400 units/hr F/U plans for oral anticoag Daily hep lvl cbc  Levester Fresh, PharmD, BCPS, BCCCP Clinical Pharmacist [phone]  Please check AMION for all [place] numbers  08/12/2019 2:10 PM

## 2019-08-13 DIAGNOSIS — R1900 Intra-abdominal and pelvic swelling, mass and lump, unspecified site: Secondary | ICD-10-CM

## 2019-08-13 LAB — GLUCOSE, CAPILLARY
Glucose-Capillary: 89 mg/dL (ref 70–99)
Glucose-Capillary: 101 mg/dL — ABNORMAL HIGH (ref 70–99)
Glucose-Capillary: 102 mg/dL — ABNORMAL HIGH (ref 70–99)
Glucose-Capillary: 117 mg/dL — ABNORMAL HIGH (ref 70–99)
Glucose-Capillary: 106 mg/dL — ABNORMAL HIGH (ref 70–99)
Glucose-Capillary: 104 mg/dL — ABNORMAL HIGH (ref 70–99)

## 2019-08-13 LAB — CK: Total CK: 341 U/L (ref 49–397)

## 2019-08-13 LAB — HEPARIN LEVEL (UNFRACTIONATED)
Heparin Unfractionated: 1.02 IU/mL — ABNORMAL HIGH (ref 0.30–0.70)
Heparin Unfractionated: 0.23 IU/mL — ABNORMAL LOW (ref 0.30–0.70)
Heparin Unfractionated: 0.57 IU/mL (ref 0.30–0.70)

## 2019-08-13 NOTE — Progress Notes (Signed)
STROKE TEAM PROGRESS NOTE   INTERVAL HISTORY Patient much more awake, alert, eye contact on the right.  Still nonverbal, but follow commands on the right side.  Currently on tube feeding at night, and dysphagia 1 diet during the day.  Still on free water and IV fluid.  Vitals:   08/12/19 2315 08/13/19 0315 08/13/19 0500 08/13/19 0546  BP: 117/82 119/84  118/82  Pulse: 84     Resp: (!) 21 16    Temp: 98.8 F (37.1 C) 99.8 F (37.7 C)    TempSrc: Oral Axillary    SpO2: 97% 97%    Weight:   103.5 kg   Height:        CBC:  Recent Labs  Lab 08/10/19 0618 08/12/19 0331  WBC 10.7* 10.0  HGB 10.0* 9.5*  HCT 29.4* 29.8*  MCV 84.7 87.9  PLT 282 [id number]    Basic Metabolic Panel:  Recent Labs  Lab 08/07/19 0300 08/09/19 0213 08/12/19 0331  NA 136 138 135  K 3.6 3.7 3.7  CL 106 105 104  CO2 24 25 28   GLUCOSE 123* 112* 117*  BUN 17 14 14   CREATININE 0.77 0.79 0.76  CALCIUM 8.2* 8.5* 8.2*  MG 2.1  --   --   PHOS 3.2  --   --     IMAGING Ct Head Wo Contrast Ct Cervical Spine Wo Contrast  07/19/2019 1132 1. 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 2. No associated hemorrhage and mild intracranial mass effect at this time, including trace leftward midline shift. 3. Evidence of large vessel occlusion: Hyperdensity of the right ICA terminus, the right A1 and MCA. 4. Unaffected brain parenchyma appears negative. 5.  No acute traumatic injury identified in the cervical spine.   Mr Brain 45 Contrast Mr Angio Head Wo Contrast Mr Angio Neck Wo Contrast 07/19/2019 1330 1. 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 The right ICA terminus, right ACA and MCA also appear occluded. 2. 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 Cytotoxic edema but no convincing hemorrhage. Stable mild mass effect with trace leftward midline shift. 3. Intracranial MRA is degraded by motion artifact, but no other large vessel  occlusion is suspected. There appears to be lobes reperfusion of the right PCA. 4. The left hemisphere and posterior fossa brain parenchyma appears normal.   Ct Head Wo Contrast 07/19/2019 1532 Similar appearance to earlier. Low-density and swelling of the right hemisphere in the ACA and MCA territories with mass effect and right-to-left shift of 5-6 mm. No hemorrhagic transformation at this time.   Ct Head Wo Contrast 07/20/2019 0444 1. Acute right ACA and MCA territory infarct with progressive swelling that has decompressed through the craniectomy defect. 2. No acute hemorrhage or new infarction.   Ct Angio Head W Or Wo Contrast Ct Angio Neck W Or Wo Contrast 07/21/2019 1. Stable from prior MRA. 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 No evidence of atherosclerosis or vasculopathy in the other vessels. 2. Cytotoxic edema causes 5 mm of midline shift and brain bulging through the craniectomy defect. Mild petechial hemorrhage is seen at the basal ganglia.   Ct Head Wo Contrast 07/23/2019 1. Unchanged appearance of massive right MCA and ACA territory infarcts with parenchyma extending through decompressive craniectomy. 2. Small focus of suspected hemorrhage adjacent to the right caudate head. 07/30/2019 1. Decreasing mass effect within  large right anterior MCA and ACA territory infarct. 2. Midline shift is no longer present. There is decreased effacement of the right lateral ventricle. 3. Infarcted brain tissue still herniates through the craniectomy site. 4. New parenchymal hemorrhage within the infarcted tissue anteriorly measures 2.5 x 2.3 x 2.7 cm. 5. No new infarct. 08/01/2019  1. Stable head CT since 07/30/2019. 2. Again noted is a large infarct involving the right MCA and right ACA territories with brain tissue herniating through the right craniotomy defect. 3. Parenchymal hemorrhage in the right anterior cortex has minimally changed. Stable  petechial hemorrhage in the right basal ganglia region.  08/11/2019 1. New patchy parenchymal hemorrhages along the periphery of the infarcted RIGHT frontoparietal lobe, compatible with hemorrhagic conversion, largest focus underlying the RIGHT frontal bone measures 1.3 cm greatest dimension. 2. Previously described dominant focus of hemorrhage along the anterior margin of the craniectomy site has decreased in size and density compared to previous exams, compatible with expected evolution. 3. Stable herniation of infarcted brain through the RIGHT frontal-parietal-temporal craniectomy site. 4. No additional mass effect or midline shift on today's exam.  Dg Chest Port 1 View 08/04/2019 Well-positioned support structures. No active cardiopulmonary disease.  08/01/2019 No acute disease   Ct Angio Chest Pe W Or Wo Contrast 08/01/2019 Nonocclusive thrombus seen within the left posterior lower lobe segmental artery.    [place] ABD Limited 08/11/2019 Suspected mildly complex seroma versus liquified hematoma in the superficial soft tissues of the RIGHT abdomen, measuring 7.9 cardiomyopathy greatest dimension, surrounding patient's calvarium (location of patient's skull status post partial craniectomy), appearance less suggestive of abscess. The collection seen today by ultrasound is similar to the appearance of the collection on earlier CT abdomen of 08/04/2019.  2D Echocardiogram 07/20/2019  1. The left ventricle has normal systolic function with an ejection fraction of 60-65%. The cavity size was normal. Left ventricular diastolic parameters were normal.  2. The right ventricle has normal systolic function. The cavity was normal. There is no increase in right ventricular wall thickness.  3. The pericardial effusion is circumferential.  4. Trivial pericardial effusion is present.  5. The mitral valve is grossly normal.  6. The tricuspid valve is grossly normal.  7. The aortic valve is tricuspid. No stenosis  of the aortic valve.  8. The aorta is normal unless otherwise noted.  9. The aortic root is normal in size and structure. 10. No cardiac source of embolism identified. 11. When compared to the prior study: No comparison.  LE Dopplers 08/02/2019 Right: Findings consistent with acute deep vein thrombosis involving the right peroneal veins.  Left: Findings consistent with acute deep vein thrombosis involving the left common femoral vein, left femoral vein, left proximal profunda vein, left popliteal vein, left posterior tibial veins, and left peroneal veins. Extending up into left iliac vein  and IVC.  07/21/2019 Right: Findings consistent with acute deep vein thrombosis involving the right peroneal veins. Left: Findings consistent with acute deep vein thrombosis involving the left popliteal vein, left posterior tibial veins, and left peroneal veins.  Vas [place] Transcranial Doppler W Bubbles 07/25/2019 No HITS heard heard at rest. No HITS heard heard during valsalva.   2D echo w/bubble 08/03/2019 Limited bubble study for shunt. No evidence for atrial level right to left shunt.  Ct Abdomen Pelvis W Contrast 08/04/2019 1. Large fluid collection in the lower right anterior abdominal wall subcutaneous fat with what appears to be an internal surgical drain, favored to represent a postoperative hematoma or proteinaceous  seroma, however, the possibility of an abscess is not excluded in light of the patient's fever.  2. Small high attenuation fluid collection along the left pelvic sidewall which likely represents a hematoma, as above.  3. Additional incidental findings, as above.   PHYSICAL EXAM     General- Well nourished, well developed, middle-aged African-American male. He has right hemicraniectomy surgical incision on the scalp.   Ophthalmologic- fundi not visualized due to noncooperation.  Cardiovascular - Regular rhythm, tachycardia.  Neuro - patient is awake alert. Nonverbal, but able  to follow all simple commands on the right hand and foot. Eyes in right gaze position, barely cross midline, not blinking to visual threat to the left, PERRL. Left facial droop. Tongue protrusion not corporative. Has spontaneous movement of RUE against gravity 4/5 at least and RLE in bed 2+/5 at least. On pain stimulation, mild withdraw of LLE, but no movement of LUE. DTR 1+ and no babinski. Sensation, coordination and gait not tested.  ASSESSMENT/PLAN Matthew Black is a 43 y.o. male with no significant past medical history found down x 2 days nonverbal with L hemiparesis.   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 Trace L midline shift. ELVO at R ICA, R A1, R MCA.  MRI  Large R brain infarct sparing R PCA. Cytotoxic edema but no hemorrhage. Stable trace midline shift.  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 R ICA terminus, R ACA, R MCA occluded.   CT head similar w/ low density and sweddling R ACA and MCA territories, now with 5-77mm midline shift. no hemorrhage  CT head acute R ACA and MCA infarct w/ progressive swelling decompressed through craniectomy defect.   CTA head and neck stable MLS. R ICA occlusion in neck that continues into R ACA and MCA. Mild petechial hemorrhage at basal ganglia  CT Head 8/29 - Unchanged appearance of massive right MCA and ACA territory infarcts with parenchyma extending through decompressive craniectomy. Small focus of suspected hemorrhage adjacent to the right caudate head.  CT Head WO - 07/30/19 - New hematoma within the infarcted tissue   CT repeat 08/01/2019 stable w/o change  CT head repeat 08/11/19 - new patchy hemorrhage along periphery of infarcted R frontoparietal lobe (likely HT) decreased  dominant anterior margin hemorrhage. Stable herniation of infarcted brain through crani site.   2D Echo EF 60-65%  2D echo w/ bubble ordered no LRS   LE venous doppler DVT in R peroneal, L popliteal, L posterior tibial and L peroneal  LE venous doppler repeat DVT in R peroneal, L popliteal, L posterior tibial and L peroneal  CT chest Nonocclusive thrombus seen within the left posterior lower lobe segmental artery.   TCD bubble no HITS, no PFO  LDL 83  HgbA1c 5.4  UDS positive THC  Hypercoagulable and autoimmune work up negative  Heparin 1500 subq for VTE prophylaxis  No antithrombotic prior to admission, treated DVT with heparin IV, then  discontinued due to hemorrhagic conversion with right frontal hematoma, now resumed heparin IV given extensive DVT and PE  Therapy recommendations:  SNF (reassessed by CIR and declined 9/16)  Disposition:  pending   Cyctotoxic cerebral edema  S/p R decompressive hemicraniectomy (Nundkumar) w/ flap R abd  07/19/2019  PICC placed - keep for now per Leonie Man  Given One dose of 23.4% 8/26; 3%  off 8/30 1700  On NS to 75cc and decrease free water to 150 Q8  Monitor Na daily  CT repeat 07/30/19 - New parenchymal hemorrhage within the infarcted tissue  Off helmet to further release pressure  CT repeat 08/01/2019 stable w/o change  CT head repeat 08/11/19 - new patchy hemorrhage along periphery of infarcted R frontoparietal lobe (likely HT) but previous right frontal hematoma now much reduced in size  ongoing neuro check since on heparin IV  Right ICA occlusion  MRA and CTA showed right ICA occlusion from origin to terminal  Wife denies any head trauma  Wife stated that pt had recent aggressive exercise with weight lifting  Concerning dissection as working diagnosis of stroke etiology   B LE DVT Small LLL PE  LE venous doppler DVT in R peroneal, L popliteal, L posterior tibial and L peroneal  Etiology unclear  Started IV heparin  07/26/2019  IVC filter placed 8/27. Plan retrieval in 8-12 weeks as an IP in an IR clinic  Hypercoagulable work up negative   Off SCDs  LE venous doppler 08/02/19 repeat extensive DVT in R peroneal, L popliteal, L posterior tibial and L peroneal up to the IVC filter  CT chest Nonocclusive thrombus seen within the left posterior lower lobe segmental artery.   hydrated w/ IVF @ 75cc, continue free water and TF  Treated with IV Heparin but then off secondary to intracranial hemorrhage  Restarted IV heparin given extensive DVT and PE with close neuro monitoring. Pt remains stable.  Hematology consult, appreciate help - hypercoag w/u neg.  At risk for recurrent clots. AC recommended w/ lovenox at d/c.  Factor V Leiden, homocysteine, Prot C&S, Prothombin gene mutation  All neg.   Abdominal wall hematoma  Large fluid collection seen on CT abdomen  CT abd/pel concerning for abdominal wall fluid collection likely old hematoma  Dr. Kathyrn Sheriff did fluid aspiration and seems to be old blood component  Fluid aspiration sent for culture 9/11 - no growth < 24 hrs  Repeat ABD [place] complex seroma vs liquified hematoma R abd similar to previous  Seizure-like activity  EEG continuous slowing, excessive beta activity, sleep spindle asymmetry decreased R->related to stroke and sedation. No SZ  Long Term EEG cortical dysfunction in right frontal region  Seizure precautions  Continue Keppra   SIRS/fever/tachycardia/tachypnea/leukocytosis - central neurogenic fever  Tmax - 101.7->afebrile -> afebrile   WBC - 10.7->10.0  CXR x 2 NAD  U/A x 3 - negative. UCx neg   Blood cultures - neg, repeated again 08/04/19 -> no growth on empiric unasyn -> changed to vanco, cefepime and flagyl 08/05/19->now off  Lactic acid 1.2->2.0  Hepatitis panel 9/12 - neg  On baclofen 10 mg tid and and Bromocriptine 5 mg qid and buspar 30mg  tid for central neurogenic fever  Tachycardia, resolved  HR normal  now  Likely due to fever  Hydration with IVF, TF and free water  On metoprolol to 50 Q8h  EKG ST, rate 120 w/ possible LA enlargement  Troponin series neg x 3    Hyperlipidemia  Home meds:  no statin  LDL 83, goal < 70  Was on lipitor 20 mg daily   D/c statin due to elevated LFT  Elevated LFT, improving  AST - 105-118-98-85   ALT - [phone]  CK [id number]  Hepatitis panel 9/12 - neg  Dysphagia .  Secondary to stroke . Dysphagia 1 diet with thin liquid  Cortrak w/ TF at night  On free water 150cc q8   Speech on board   Other Stroke Risk Factors  Former Cigarette smoker, quit 3 yrs ago  ETOH use  Substance abuse UDS - positive for THC   Obesity, Body mass index is 31.82 kg/m., recommend weight loss, diet and exercise as appropriate   Other Active Problems  Normocytic anemia - hematology felt d/t ongoing blood draws 10.0->9.5  Hospital day # 25  Rosalin Hawking, MD PhD Stroke Neurology 08/13/2019 2:25 PM       To contact Stroke Continuity provider, please refer to [email or web address] After hours, contact General Neurology

## 2019-08-13 NOTE — Plan of Care (Signed)
Progressing towards goals

## 2019-08-13 NOTE — Plan of Care (Signed)
  Problem: Clinical Measurements: Goal: Ability to maintain clinical measurements within normal limits will improve Outcome: Progressing   Problem: Clinical Measurements: Goal: Cardiovascular complication will be avoided Outcome: Progressing   

## 2019-08-13 NOTE — Progress Notes (Signed)
ANTICOAGULATION CONSULT NOTE  Pharmacy Consult:  Heparin Indication:  DVT/PE  Allergies  Allergen Reactions  . Hydrocodone Nausea Only    Patient Measurements: Height: 5\' 11"  (180.3 cm) Weight: 228 lb 2.8 oz (103.5 kg) IBW/kg (Calculated) : 75.3 Heparin Dosing Weight: 96 kg  Vital Signs: Temp: 99.5 F (37.5 C) (09/19 1115) Temp Source: Axillary (09/19 1115) BP: 124/76 (09/19 1115) Pulse Rate: 93 (09/19 1115)  Labs: Recent Labs    08/11/19 0338 08/12/19 0331  08/13/19 0612 08/13/19 0720 08/13/19 1806  HGB  --  9.5*  --   --   --   --   HCT  --  29.8*  --   --   --   --   PLT  --  306  --   --   --   --   HEPARINUNFRC 0.41 1.90*   < > 1.02* 0.23* 0.57  CREATININE  --  0.76  --   --   --   --   CKTOTAL 595* 453*  --  341  --   --    < > = values in this interval not displayed.    Estimated Creatinine Clearance: 148.8 mL/min (by C-G formula based on SCr of 0.76 mg/dL).   Assessment: 14 YOM presented with acute ischemic CVA and did not receive tPA.  There was an incidental finding of DVT and IVC filter was placed on 07/21/19 as patient was not a candidate for [facility] due to craniectomy.  He was cleared to start IV heparin, but then stopped on 07/30/19 due to right frontal lobe hemorrhage.  CTA on 08/01/19 showed non-occlusive thrombus and repeat Doppler on 08/02/19 positive for bilateral DVTs.  Heparin restarted on 08/02/19 given extensive DVTs and PE.    Heparin level slightly supra-therapeutic.  It has been difficult to maintain therapeutic heparin levels.  No bleeding reported.  Goal of Therapy:  Heparin level 0.3-0.5 units/ml Monitor platelets by anticoagulation protocol: Yes    Plan:   Reduce heparin gtt slightly to 1500 units/hr F/U AM labs  Limmie Schoenberg D. Mina Marble, PharmD, BCPS, [place] 08/13/2019, 7:18 PM

## 2019-08-13 NOTE — Progress Notes (Signed)
PROGRESS NOTE    Matthew Black  [id number] DOB: 05/09/1976 DOA: 07/19/2019 PCP: Colon Branch, MD   Brief Narrative:  Matthew Pennella Matthew Black a 43 y.o.malewithout significant known PMH who presented on 8/25 with AMS as well as L hemiparesis and aphasia. UDS + for THC. CT with large R hemispheric infarct with confluent cytotoxic edema in R ACA and MCA territories and mild mass effect. 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 He was admitted to [facility] with neurology consultation and started on 3% saline. Neurosurgery was consulted and Dr. Kathyrn Sheriff recommended early decompressive hemicraniectomy. He subsequently developed hemorrhagic conversion with hematoma on 9/5; helmet was removed to further release intracranial pressure and heparin was discontinued. LE doppler also indicated DVT in R peroneal, L popliteal, L posterior tibial, and L peroneal veins; IVC filter was placed.  Patient had seizure-like activity, for which he is on Keppra. He was subsequently found to have possible RLL infiltrate and with concern for aspiration he was made NPO and started on Unasyn.CTA was performed and it showed a non-occlusive LLL thrombus on 9/7.Subsequent repeat DVT US was performed 9/8 and showed acute R DVT of the peroneal veins as well as acute L DVT involving common femoral vein, L femoral vein, L proximal profunda vein, L popliteal vein, L posterior tibial veins, and L peroneal veins - and it extends up into the left iliac vein and IVC.  CT abdomen showed large fluid collection in lower right anterior abdominal which could be hematoma, seroma or an abscess.  Neurosurgery consulted and tapped the abdominal hematoma. Hospital course remarkable for persistent fever, Cultures negative so far.  Antibiotics have been stopped .  Assessment & Plan:   Active Problems:   Stroke (cerebrum) (HCC)   Pressure injury of skin   Acute respiratory failure (HCC)   Endotracheal tube present   Deep vein  thrombosis (DVT) of non-extremity vein   Hypokalemia   FUO (fever of unknown origin)   Acute blood loss anemia   SIRS (systemic inflammatory response syndrome) (HCC)   Leukocytosis   Primary hypercoagulable state ([place])   Acute pulmonary embolism without acute cor pulmonale (HCC)   Right MCA/ACA infarct with left hemiparesis: Mental status is gradually improving now. able to follow simple commands. encephalopathy due to massive CVA.  Has right ICA, MCA and ACA occlusions.  Status post hemicraniectomy with abdominal flap implant.  Hemorrhagic conversion of CVA when he was started on heparin for DVT.  Heparin has been resumed.  Neurology/neurosurgery managing.  CT head from 08/11/2019 reviewed by Dr. Viviana Simpler per Dr. Leonie Man okay to continue IV heparin, may transition to p.o. Eliquis at 5 mg p.o. twice daily without loading dose upon discharge.   Bilateral Lower Extremity DVT (Lt > Rt)/Non-occlusive left lower lobe PE: Currently on heparin.  Also has IVC filter.  Stable abdominal wall hematoma under the abdominal flap incision site.  Has edema of the left lower extremity.  Seizure-like activity: On Keppra as per neurology.  Persistent Fever:Marland Kitchen  Extensive work-up did not reveal any clear source of infection.  Suspected to be  associated  with hemorrhagic CVA. Antibiotics have been stopped. No definite source for infection . Suspected ventral fever from intracranial process.  Patient treated with bromocriptine and now remains afebrile.   Abdominal hematoma: CT abdomen showed large fluid collection in lower right anterior abdominal which could be hematoma, seroma .  Neurosurgery consulted and tapped the abdominal hematoma.  Cultures negative so far.  Elevated CK/Elevated  liver enzymes: he was Found on the floor PTA. CK elevated  due to rhabdomyolysis and is improving with IV fluids.  Also has mild elevated liver enzymes.  Viral Hepatitis panel negative  Normocytic anemia: Currently H&H stable.   Dysphagia: Encephalopathy due to massive CVA.  On feeding tube.Speech following ,underwent MBS.Started on dysphagia 1 diet. Continue to feed orally during the day and continue tube feeding overnight. Patient currently remains on maintenance IV fluids, on free water through the tube at night. We will need to continue tube feeding until patient has reliable oral intake. We will check electrolytes in the morning to adjust free water.  Nutrition Problem: Inadequate oral intake Etiology: acute illness  DVT prophylaxis: Heparin IV  Code Status: Full Code  Family Communication:  None   Disposition Plan: likely SNF when is stable.  Procedures: Hemicraniectomy  Antimicrobials:  Anti-infectives (From admission, onward)   Start     Dose/Rate Route Frequency Ordered Stop   08/05/19 1000  vancomycin (VANCOCIN) 1,250 mg in sodium chloride 0.9 % 250 mL IVPB  Status:  Discontinued     1,250 mg 166.7 mL/hr over 90 Minutes Intravenous Every 12 hours 08/04/19 1856 08/08/19 0914   08/05/19 0600  ceFEPIme (MAXIPIME) 2 g in sodium chloride 0.9 % 100 mL IVPB  Status:  Discontinued     2 g 200 mL/hr over 30 Minutes Intravenous Every 8 hours 08/04/19 1856 08/08/19 0914   08/04/19 2200  metroNIDAZOLE (FLAGYL) IVPB 500 mg  Status:  Discontinued     500 mg 100 mL/hr over 60 Minutes Intravenous Every 8 hours 08/04/19 1743 08/06/19 1548   08/04/19 1830  vancomycin (VANCOCIN) 2,000 mg in sodium chloride 0.9 % 500 mL IVPB     2,000 mg 250 mL/hr over 120 Minutes Intravenous  Once 08/04/19 1805 08/04/19 2053   08/04/19 1815  ceFEPIme (MAXIPIME) 2 g in sodium chloride 0.9 % 100 mL IVPB     2 g 200 mL/hr over 30 Minutes Intravenous  Once 08/04/19 1805 08/04/19 1842   07/31/19 2200  Ampicillin-Sulbactam (UNASYN) 3 g in sodium chloride 0.9 % 100 mL IVPB  Status:  Discontinued     3 g 200 mL/hr over 30 Minutes Intravenous Every 6 hours 07/31/19 1401 08/04/19 1743   07/31/19 1415  Ampicillin-Sulbactam (UNASYN) 3 g  in sodium chloride 0.9 % 100 mL IVPB     3 g 200 mL/hr over 30 Minutes Intravenous STAT 07/31/19 1401 07/31/19 1551   07/19/19 2315  bacitracin 50,000 Units in sodium chloride 0.9 % 500 mL irrigation  Status:  Discontinued       As needed 07/19/19 2315 07/19/19 2339      Subjective:  Patient seen and examined at morning rounds.  No overnight events.  He was able to follow commands and able to move the right upper and lower extremities.  No movements on the left side. Objective: Vitals:   08/13/19 0714 08/13/19 0918 08/13/19 1106 08/13/19 1115  BP:  119/79  124/76  Pulse:   93 93  Resp: 18   16  Temp:  99.1 F (37.3 C)  99.5 F (37.5 C)  TempSrc:  Axillary  Axillary  SpO2:  98%  98%  Weight:      Height:        Intake/Output Summary (Last 24 hours) at 08/13/2019 1433 Last data filed at 08/13/2019 1100 Gross per 24 hour  Intake 2612.14 ml  Output 2275 ml  Net 337.14 ml   [employer]  08/11/19 0500 08/12/19 0500 08/13/19 0500  Weight: 105.4 kg 102.9 kg 103.5 kg    Examination:  General exam: Resting comfortably HEENT: Eyes open,Oral mucosa moist,   feeding tube, staples on the scalp. Cortrack tube in the left Nare respiratory system: Bilateral equal air entry, normal vesicular breath sounds, no wheezes or crackles  Cardiovascular system: S1 & S2 heard, RRR. No JVD, murmurs, rubs, gallops or clicks. Gastrointestinal system: Abdomen is nondistended, soft and nontender.  Normal bowel sounds heard.  Staples on the right lower quadrant with area of swelling around the staples (part of his cranium is located at this site) Central nervous system: Awake, apparently able to follow some commands, apparently able to take oral intake , left hemiparesis Extremities: Left lower extremity edema Skin: No rashes, lesions or ulcers,no icterus ,no pallor  Data Reviewed:   CBC: Recent Labs  Lab 08/07/19 0300 08/08/19 0530 08/09/19 0213 08/10/19 0618 08/12/19 0331  WBC 15.1* 11.2*  10.0 10.7* 10.0  HGB 9.7* 9.0* 10.0* 10.0* 9.5*  HCT 29.9* 28.4* 30.4* 29.4* 29.8*  MCV 86.7 87.1 86.4 84.7 87.9  PLT 282 274 278 282 [id number]   Basic Metabolic Panel: Recent Labs  Lab 08/07/19 0300 08/09/19 0213 08/12/19 0331  NA 136 138 135  K 3.6 3.7 3.7  CL 106 105 104  CO2 24 25 28   GLUCOSE 123* 112* 117*  BUN 17 14 14   CREATININE 0.77 0.79 0.76  CALCIUM 8.2* 8.5* 8.2*  MG 2.1  --   --   PHOS 3.2  --   --    GFR: Estimated Creatinine Clearance: 148.8 mL/min (by C-G formula based on SCr of 0.76 mg/dL). Liver Function Tests: Recent Labs  Lab 08/07/19 0300 08/09/19 0213 08/12/19 0331  AST 118* 98* 85*  ALT 60* 65* 164*  ALKPHOS 79 74 76  BILITOT 1.2 0.6 0.5  PROT 6.5 6.6 6.4*  ALBUMIN 2.6* 2.5* 2.5*   No results for input(s): LIPASE, AMYLASE in the last 168 hours. No results for input(s): AMMONIA in the last 168 hours. Coagulation Profile: No results for input(s): INR, PROTIME in the last 168 hours. Cardiac Enzymes: Recent Labs  Lab 08/09/19 0213 08/10/19 0618 08/11/19 0338 08/12/19 0331 08/13/19 0612  CKTOTAL 2,063* 852* 595* 453* 341   BNP (last 3 results) No results for input(s): PROBNP in the last 8760 hours. HbA1C: No results for input(s): HGBA1C in the last 72 hours. CBG: Recent Labs  Lab 08/12/19 2045 08/12/19 2322 08/13/19 0404 08/13/19 0855 08/13/19 1233  GLUCAP 103* 104* 89 101* 102*   Lipid Profile: No results for input(s): CHOL, HDL, LDLCALC, TRIG, CHOLHDL, LDLDIRECT in the last 72 hours. Thyroid Function Tests: No results for input(s): TSH, T4TOTAL, FREET4, T3FREE, THYROIDAB in the last 72 hours. Anemia Panel: No results for input(s): VITAMINB12, FOLATE, FERRITIN, TIBC, IRON, RETICCTPCT in the last 72 hours. Sepsis Labs: No results for input(s): PROCALCITON, LATICACIDVEN in the last 168 hours.  Recent Results (from the past 240 hour(s))  Culture, Urine     Status: None   Collection Time: 08/04/19 12:48 PM   Specimen: Urine,  Random  Result Value Ref Range Status   Specimen Description URINE, RANDOM  Final   Special Requests NONE  Final   Culture   Final    NO GROWTH Performed at [facility], [address] [place], [place] [address]    Report Status 08/05/2019 FINAL  Final  Culture, blood (routine x 2)     Status: None  Collection Time: 08/04/19  6:59 PM   Specimen: BLOOD LEFT ARM  Result Value Ref Range Status   Specimen Description BLOOD LEFT ARM  Final   Special Requests   Final    BOTTLES DRAWN AEROBIC ONLY Blood Culture results may not be optimal due to an inadequate volume of blood received in culture bottles   Culture   Final    NO GROWTH 5 DAYS Performed at [facility], [place] [address] [place], [place] [address]    Report Status 08/09/2019 FINAL  Final  Culture, blood (routine x 2)     Status: None   Collection Time: 08/04/19  6:59 PM   Specimen: BLOOD LEFT HAND  Result Value Ref Range Status   Specimen Description BLOOD LEFT HAND  Final   Special Requests   Final    BOTTLES DRAWN AEROBIC ONLY Blood Culture results may not be optimal due to an inadequate volume of blood received in culture bottles   Culture   Final    NO GROWTH 5 DAYS Performed at [facility], [place] [address], [place], [place] [address]    Report Status 08/09/2019 FINAL  Final  Body fluid culture     Status: None   Collection Time: 08/05/19 11:33 AM   Specimen: Body Fluid  Result Value Ref Range Status   Specimen Description FLUID ABDOMEN  Final   Special Requests NONE  Final   Gram Stain   Final    RARE WBC PRESENT,BOTH PMN AND MONONUCLEAR NO ORGANISMS SEEN    Culture   Final    NO GROWTH 3 DAYS Performed at [facility], [address], [place], [place] [address]    Report Status 08/08/2019 FINAL  Final  Culture, blood (routine x 2)     Status: None   Collection Time: 08/06/19  4:30 PM   Specimen: BLOOD  Result Value Ref Range Status   Specimen Description BLOOD LEFT ANTECUBITAL   Final   Special Requests AEROBIC BOTTLE ONLY Blood Culture adequate volume  Final   Culture   Final    NO GROWTH 5 DAYS Performed at [place] [address], [place], [place] [address]    Report Status 08/11/2019 FINAL  Final  Culture, blood (routine x 2)     Status: None   Collection Time: 08/06/19  4:42 PM   Specimen: BLOOD LEFT HAND  Result Value Ref Range Status   Specimen Description BLOOD LEFT HAND  Final   Special Requests NONE  Final   Culture   Final    NO GROWTH 5 DAYS Performed at [facility], [place] [address] [place],  [address]    Report Status 08/11/2019 FINAL  Final    Radiology Studies: No results found. Scheduled Meds: . baclofen  10 mg Oral TID  . bromocriptine  5 mg Oral QID  . busPIRone  30 mg Oral TID  . chlorhexidine  15 mL Mouth Rinse BID  . Chlorhexidine Gluconate Cloth  6 each Topical Daily  . famotidine  20 mg Per Tube BID  . feeding supplement (ENSURE ENLIVE)  237 mL Oral BID BM  . feeding supplement (PRO-STAT SUGAR FREE 64)  30 mL Per Tube TID  . free water  150 mL Per Tube Q8H  . levETIRAcetam  500 mg Per Tube BID  . mouth rinse  15 mL Mouth Rinse q12n4p  . metoprolol tartrate  50 mg Per Tube Q8H  . sodium  chloride flush  10-40 mL Intracatheter Q12H   Continuous Infusions: . sodium chloride 10 mL/hr at 08/01/19 1715  . sodium chloride 50 mL/hr at 08/12/19 2125  . feeding supplement (OSMOLITE 1.5 CAL) 1,000 mL (08/12/19 2339)  . heparin 1,550 Units/hr (08/13/19 0950)     LOS: 25 days   Barb Merino, MD [facility] If 7PM-7AM, please contact night-coverage [email or web address] Password [facility] 08/13/2019, 2:33 PM

## 2019-08-13 NOTE — Progress Notes (Signed)
ANTICOAGULATION CONSULT NOTE  Pharmacy Consult:  Heparin Indication:  DVT/PE  Allergies  Allergen Reactions  . Hydrocodone Nausea Only    Patient Measurements: Height: 5\' 11"  (180.3 cm) Weight: 228 lb 2.8 oz (103.5 kg) IBW/kg (Calculated) : 75.3 Heparin Dosing Weight: 96 kg  Vital Signs: Temp: 99.1 F (37.3 C) (09/19 0918) Temp Source: Axillary (09/19 0918) BP: 119/79 (09/19 0918) Pulse Rate: 84 (09/18 2315)  Labs: Recent Labs    08/11/19 0338 08/12/19 0331  08/12/19 1340 08/13/19 0612 08/13/19 0720  HGB  --  9.5*  --   --   --   --   HCT  --  29.8*  --   --   --   --   PLT  --  306  --   --   --   --   HEPARINUNFRC 0.41 1.90*   < > 0.36 1.02* 0.23*  CREATININE  --  0.76  --   --   --   --   CKTOTAL 595* 453*  --   --  341  --    < > = values in this interval not displayed.    Estimated Creatinine Clearance: 148.8 mL/min (by C-G formula based on SCr of 0.76 mg/dL).   Assessment: 29 YOM presented with acute ischemic CVA and did not receive tPA.  There was an incidental finding of DVT and IVC filter was placed on 07/21/19 as patient was not a candidate for [facility] due to craniectomy.  He was cleared to start IV heparin, but then stopped on 07/30/19 due to right frontal lobe hemorrhage.  CTA on 08/01/19 showed non-occlusive thrombus and repeat Doppler on 08/02/19 positive for bilateral DVTs.  Heparin restarted on 08/02/19 given extensive DVTs and PE.    Heparin level slightly subtherapeutic (0.23) on gtt at 1400 units/hr. Initial heparin level this AM (1.02) was drawn incorrectly from line where heparin running. No issues with line or bleeding reported per RN. Hgb stable at 9.5.  Goal of Therapy:  Heparin level 0.3-0.5 units/ml Monitor platelets by anticoagulation protocol: Yes    Plan:   Increase heparin to 1550 units/hr F/u 6 hr heparin level F/U plans for oral anticoag Monitor for s/sx of bleeding  Nicoletta Dress, PharmD PGY2 Infectious Disease Pharmacy Resident   08/13/2019 9:31 AM

## 2019-08-14 ENCOUNTER — Inpatient Hospital Stay (HOSPITAL_COMMUNITY): Payer: Medicaid Other

## 2019-08-14 DIAGNOSIS — R4182 Altered mental status, unspecified: Secondary | ICD-10-CM

## 2019-08-14 LAB — URINALYSIS, COMPLETE (UACMP) WITH MICROSCOPIC
Bacteria, UA: NONE SEEN
Bilirubin Urine: NEGATIVE
Glucose, UA: NEGATIVE mg/dL
Hgb urine dipstick: NEGATIVE
Ketones, ur: NEGATIVE mg/dL
Leukocytes,Ua: NEGATIVE
Nitrite: NEGATIVE
Protein, ur: NEGATIVE mg/dL
Specific Gravity, Urine: 1.012 (ref 1.005–1.030)
pH: 7 (ref 5.0–8.0)

## 2019-08-14 LAB — GLUCOSE, CAPILLARY
Glucose-Capillary: 99 mg/dL (ref 70–99)
Glucose-Capillary: 118 mg/dL — ABNORMAL HIGH (ref 70–99)
Glucose-Capillary: 130 mg/dL — ABNORMAL HIGH (ref 70–99)
Glucose-Capillary: 109 mg/dL — ABNORMAL HIGH (ref 70–99)
Glucose-Capillary: 119 mg/dL — ABNORMAL HIGH (ref 70–99)

## 2019-08-14 LAB — HEPARIN LEVEL (UNFRACTIONATED)
Heparin Unfractionated: 0.67 IU/mL (ref 0.30–0.70)
Heparin Unfractionated: 0.62 IU/mL (ref 0.30–0.70)

## 2019-08-14 LAB — CK: Total CK: 250 U/L (ref 49–397)

## 2019-08-14 MED ORDER — BROMOCRIPTINE MESYLATE 2.5 MG PO TABS
5.0000 mg | ORAL_TABLET | Freq: Four times a day (QID) | ORAL | Status: DC
  Administered 2019-08-14 – 2019-08-16 (×6): 5 mg
  Filled 2019-08-14 (×7): qty 2

## 2019-08-14 MED ORDER — CLEVIDIPINE BUTYRATE 0.5 MG/ML IV EMUL
0.0000 mg/h | INTRAVENOUS | Status: DC
  Filled 2019-08-14: qty 50

## 2019-08-14 MED ORDER — BUSPIRONE HCL 15 MG PO TABS
30.0000 mg | ORAL_TABLET | Freq: Three times a day (TID) | ORAL | Status: DC
  Administered 2019-08-14 – 2019-08-16 (×5): 30 mg
  Filled 2019-08-14 (×4): qty 2

## 2019-08-14 MED ORDER — ALTEPLASE 2 MG IJ SOLR
2.0000 mg | Freq: Once | INTRAMUSCULAR | Status: AC
  Administered 2019-08-14: 2 mg
  Filled 2019-08-14: qty 2

## 2019-08-14 MED ORDER — ONDANSETRON HCL 4 MG/2ML IJ SOLN
4.0000 mg | Freq: Four times a day (QID) | INTRAMUSCULAR | Status: DC
  Administered 2019-08-14 – 2019-08-16 (×7): 4 mg via INTRAVENOUS
  Filled 2019-08-14 (×6): qty 2

## 2019-08-14 MED ORDER — BACLOFEN 1 MG/ML ORAL SUSPENSION
10.0000 mg | Freq: Three times a day (TID) | ORAL | Status: DC
  Administered 2019-08-14 – 2019-08-22 (×25): 10 mg
  Filled 2019-08-14 (×30): qty 1

## 2019-08-14 MED ORDER — SENNOSIDES-DOCUSATE SODIUM 8.6-50 MG PO TABS
1.0000 | ORAL_TABLET | Freq: Every evening | ORAL | Status: DC | PRN
  Administered 2019-08-17: 1
  Filled 2019-08-14 (×2): qty 1

## 2019-08-14 MED ORDER — LABETALOL HCL 5 MG/ML IV SOLN: 5.0000 mg | INTRAVENOUS | Status: DC | PRN

## 2019-08-14 NOTE — Progress Notes (Signed)
STROKE TEAM PROGRESS NOTE   INTERVAL HISTORY Patient more lethargic this morning.  As per RN, patient had vomited twice at 10 AM and 12 AM.  Repeat CT head showed significant enlarged hemorrhagic conversion on the right side.  Heparin IV stopped.  BP stable under 140.  CCM on board, will transfer to ICU for close monitoring.  Repeat CT head in the evening  Vitals:   08/13/19 2315 08/14/19 0315 08/14/19 0344 08/14/19 0524  BP: 117/83 119/89    Pulse:  88    Resp: 16 17    Temp: 99.5 F (37.5 C) (!) 100.5 F (38.1 C)  99.3 F (37.4 C)  TempSrc: Axillary Axillary  Axillary  SpO2:  100%    Weight:   91.8 kg   Height:        CBC:  Recent Labs  Lab 08/10/19 0618 08/12/19 0331  WBC 10.7* 10.0  HGB 10.0* 9.5*  HCT 29.4* 29.8*  MCV 84.7 87.9  PLT 282 [id number]    Basic Metabolic Panel:  Recent Labs  Lab 08/09/19 0213 08/12/19 0331  NA 138 135  K 3.7 3.7  CL 105 104  CO2 25 28  GLUCOSE 112* 117*  BUN 14 14  CREATININE 0.79 0.76  CALCIUM 8.5* 8.2*    IMAGING Ct Head Wo Contrast Ct Cervical Spine Wo Contrast  07/19/2019 1132 1. 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 2. No associated hemorrhage and mild intracranial mass effect at this time, including trace leftward midline shift. 3. Evidence of large vessel occlusion: Hyperdensity of the right ICA terminus, the right A1 and MCA. 4. Unaffected brain parenchyma appears negative. 5.  No acute traumatic injury identified in the cervical spine.   Mr Brain 29 Contrast Mr Angio Head Wo Contrast Mr Angio Neck Wo Contrast 07/19/2019 1330 1. 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 The right ICA terminus, right ACA and MCA also appear occluded. 2. 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 Cytotoxic edema but no convincing hemorrhage. Stable mild mass effect with trace leftward midline shift. 3. Intracranial MRA is degraded by motion artifact, but no  other large vessel occlusion is suspected. There appears to be lobes reperfusion of the right PCA. 4. The left hemisphere and posterior fossa brain parenchyma appears normal.   Ct Head Wo Contrast 07/19/2019 1532 Similar appearance to earlier. Low-density and swelling of the right hemisphere in the ACA and MCA territories with mass effect and right-to-left shift of 5-6 mm. No hemorrhagic transformation at this time.   Ct Head Wo Contrast 07/20/2019 0444 1. Acute right ACA and MCA territory infarct with progressive swelling that has decompressed through the craniectomy defect. 2. No acute hemorrhage or new infarction.   Ct Angio Head W Or Wo Contrast Ct Angio Neck W Or Wo Contrast 07/21/2019 1. Stable from prior MRA. 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 No evidence of atherosclerosis or vasculopathy in the other vessels. 2. Cytotoxic edema causes 5 mm of midline shift and brain bulging through the craniectomy defect. Mild petechial hemorrhage is seen at the basal ganglia.   Ct Head Wo Contrast 07/23/2019 1. Unchanged appearance of massive right MCA and ACA territory infarcts with parenchyma extending through decompressive craniectomy. 2. Small focus of suspected hemorrhage adjacent to the right caudate head. 07/30/2019 1. Decreasing mass effect within large right anterior MCA and ACA territory infarct.  2. Midline shift is no longer present. There is decreased effacement of the right lateral ventricle. 3. Infarcted brain tissue still herniates through the craniectomy site. 4. New parenchymal hemorrhage within the infarcted tissue anteriorly measures 2.5 x 2.3 x 2.7 cm. 5. No new infarct. 08/01/2019  1. Stable head CT since 07/30/2019. 2. Again noted is a large infarct involving the right MCA and right ACA territories with brain tissue herniating through the right craniotomy defect. 3. Parenchymal hemorrhage in the right anterior cortex has  minimally changed. Stable petechial hemorrhage in the right basal ganglia region.  08/11/2019 1. New patchy parenchymal hemorrhages along the periphery of the infarcted RIGHT frontoparietal lobe, compatible with hemorrhagic conversion, largest focus underlying the RIGHT frontal bone measures 1.3 cm greatest dimension. 2. Previously described dominant focus of hemorrhage along the anterior margin of the craniectomy site has decreased in size and density compared to previous exams, compatible with expected evolution. 3. Stable herniation of infarcted brain through the RIGHT frontal-parietal-temporal craniectomy site. 4. No additional mass effect or midline shift on today's exam.    Ct Angio Chest Pe W Or Wo Contrast 08/01/2019 Nonocclusive thrombus seen within the left posterior lower lobe segmental artery.    [place] ABD Limited 08/11/2019 Suspected mildly complex seroma versus liquified hematoma in the superficial soft tissues of the RIGHT abdomen, measuring 7.9 cardiomyopathy greatest dimension, surrounding patient's calvarium (location of patient's skull status post partial craniectomy), appearance less suggestive of abscess. The collection seen today by ultrasound is similar to the appearance of the collection on earlier CT abdomen of 08/04/2019.  2D Echocardiogram 07/20/2019  1. The left ventricle has normal systolic function with an ejection fraction of 60-65%. The cavity size was normal. Left ventricular diastolic parameters were normal.  2. The right ventricle has normal systolic function. The cavity was normal. There is no increase in right ventricular wall thickness.  3. The pericardial effusion is circumferential.  4. Trivial pericardial effusion is present.  5. The mitral valve is grossly normal.  6. The tricuspid valve is grossly normal.  7. The aortic valve is tricuspid. No stenosis of the aortic valve.  8. The aorta is normal unless otherwise noted.  9. The aortic root is normal in  size and structure. 10. No cardiac source of embolism identified. 11. When compared to the prior study: No comparison.  LE Dopplers 08/02/2019 Right: Findings consistent with acute deep vein thrombosis involving the right peroneal veins.  Left: Findings consistent with acute deep vein thrombosis involving the left common femoral vein, left femoral vein, left proximal profunda vein, left popliteal vein, left posterior tibial veins, and left peroneal veins. Extending up into left iliac vein  and IVC.  07/21/2019 Right: Findings consistent with acute deep vein thrombosis involving the right peroneal veins. Left: Findings consistent with acute deep vein thrombosis involving the left popliteal vein, left posterior tibial veins, and left peroneal veins.  Vas [place] Transcranial Doppler W Bubbles 07/25/2019 No HITS heard heard at rest. No HITS heard heard during valsalva.   2D echo w/bubble 08/03/2019 Limited bubble study for shunt. No evidence for atrial level right to left shunt.  Ct Abdomen Pelvis W Contrast 08/04/2019 1. Large fluid collection in the lower right anterior abdominal wall subcutaneous fat with what appears to be an internal surgical drain, favored to represent a postoperative hematoma or proteinaceous seroma, however, the possibility of an abscess is not excluded in light of the patient's fever.  2. Small high attenuation fluid collection along the left  pelvic sidewall which likely represents a hematoma, as above.  3. Additional incidental findings, as above.  Ct Head Wo Contrast 08/14/2019 IMPRESSION: 1. Significant increase in the amount of acute hemorrhage along the periphery of patient's infarcted RIGHT hemisphere, most prominently developed adjacent to the posterior margin of the craniectomy site. There is associated increase in mass effect causing increased herniation through the craniectomy defect. The mass effect is also causing increased compression of the RIGHT lateral  ventricle. 2. New intraventricular extension of the acute hemorrhage. 3. No appreciable change of the mild leftward midline shift. No evidence of tonsillar herniation.     PHYSICAL EXAM     General- Well nourished, well developed, middle-aged African-American male. He has right hemicraniectomy surgical incision on the scalp.   Ophthalmologic- fundi not visualized due to noncooperation.  Cardiovascular - Regular rhythm and rate.  Neuro - patient is lethargic but open eyes spontaneously intermittently. Nonverbal, did not follow simple commands on the right today. Eyes in right gaze position, barely cross midline, not blinking to visual threat to the left, PERRL. Left facial droop. Tongue protrusion not corporative. Has spontaneous movement of RUE against gravity 4/5 at least and RLE in bed 2+/5 at least. On pain stimulation, mild withdraw of LLE, but no movement of LUE. DTR 1+ and no babinski. Sensation, coordination and gait not tested.  ASSESSMENT/PLAN Matthew Black is a 43 y.o. male with no significant past medical history found down x 2 days nonverbal with L hemiparesis.   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 Trace L midline shift. ELVO at R ICA, R A1, R MCA.  MRI  Large R brain infarct sparing R PCA. Cytotoxic edema but no hemorrhage. Stable trace midline shift.  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 R ICA terminus, R ACA, R MCA occluded.   CT head similar w/ low density and sweddling R ACA and MCA territories, now with 5-55mm midline shift. no hemorrhage  CT head acute R ACA and MCA infarct w/ progressive swelling decompressed through craniectomy defect.   CTA head and neck stable MLS. R ICA occlusion in neck that  continues into R ACA and MCA. Mild petechial hemorrhage at basal ganglia  CT Head 8/29 - Unchanged appearance of massive right MCA and ACA territory infarcts with parenchyma extending through decompressive craniectomy. Small focus of suspected hemorrhage adjacent to the right caudate head.  CT head 07/30/19 - New hematoma within the infarcted tissue   CT head 08/01/2019 stable w/o change  CT head 08/11/19 - new patchy hemorrhage along periphery of infarcted R frontoparietal lobe (likely HT) decreased dominant anterior margin hemorrhage. Stable herniation of infarcted brain through crani site.   CT head 9/20 - worsening with increased HT on the right and new IVH  CT repeat in pm pending  2D Echo EF 60-65%  2D echo w/ bubble ordered no LRS   LE venous doppler DVT in R peroneal, L popliteal, L posterior tibial and L peroneal  LE venous doppler repeat DVT in R peroneal, L popliteal, L posterior tibial and L peroneal  TCD bubble no HITS, no PFO  LDL 83  HgbA1c 5.4  UDS positive THC  Hypercoagulable and autoimmune work up negative  SCDs for VTE prophylaxis  No antithrombotic prior to admission, treated DVT with heparin IV, then  discontinued due to hemorrhagic conversion with right frontal HT, then resumed heparin IV given extensive DVT and PE, but now heparin IV again discontinued due to worsening HT  Therapy recommendations:  SNF (reassessed by CIR and declined 9/16)  Disposition:  pending   Cyctotoxic cerebral edema  S/p R decompressive hemicraniectomy (Nundkumar) w/ flap R abd 07/19/2019  PICC placed - keep for now per Matthew Black  Given One dose of 23.4% 8/26; 3%  off 8/30 1700  CT repeat 07/30/19 - New parenchymal hemorrhage within the infarcted tissue  Off helmet to further release pressure  CT repeat 08/01/2019 stable w/o change  CT head repeat 08/11/19 - new patchy hemorrhage along periphery of infarcted R frontoparietal lobe (likely HT) but previous right frontal hematoma now  much reduced in size  CT head 9/20 worsening right HT with new ICH and IVH  On NS to 50cc now  Monitor Na daily  ongoing neuro check since on heparin IV  Right ICA occlusion  MRA and CTA showed right ICA occlusion from origin to terminal  Wife denies any head trauma  Wife stated that pt had recent aggressive exercise with weight lifting  Concerning dissection as working diagnosis of stroke etiology   B LE DVT Small LLL PE  LE venous doppler DVT in R peroneal, L popliteal, L posterior tibial and L peroneal  Etiology unclear  Started IV heparin 07/26/2019  IVC filter placed 8/27. Plan retrieval in 8-12 weeks as an IP in an IR clinic  Hypercoagulable work up negative   Off SCDs  LE venous doppler 08/02/19 repeat extensive DVT in R peroneal, L popliteal, L posterior tibial and L peroneal up to the IVC filter  CT chest Nonocclusive thrombus seen within the left posterior lower lobe segmental artery.   hydrated w/ IVF @ 50cc, continue TF  Treated with IV Heparin but then off secondary to intracranial hemorrhage, then restarted IV heparin given extensive DVT and PE but again stopped due to worsening HT on 9/20  Hematology consult, appreciate help - hypercoag w/u neg.  At risk for recurrent clots. AC recommended w/ lovenox at d/c.  Factor V Leiden, homocysteine, Prot C&S, Prothombin gene mutation  All neg.   Abdominal wall hematoma  Large fluid collection seen on CT abdomen  CT abd/pel concerning for abdominal wall fluid collection likely old hematoma  Dr. Kathyrn Sheriff did fluid aspiration and seems to be old blood component  Fluid aspiration sent for culture 9/11 - no growth < 24 hrs  Repeat ABD [place] complex seroma vs liquified hematoma R abd similar to previous  Seizure-like activity  EEG continuous slowing, excessive beta activity, sleep spindle asymmetry decreased R->related to stroke and sedation. No SZ  Long Term EEG cortical dysfunction in right frontal  region  Seizure precautions  Continue Keppra   SIRS/fever/leukocytosis - central neurogenic fever  Tmax - 101.7->afebrile -> afebrile ->100.9  WBC - 10.7->10.0  CXR x 2 NAD  U/A x 3 - negative. UCx neg   Blood cultures - neg, repeated again 08/04/19 -> no growth on empiric unasyn -> changed to vanco, cefepime and flagyl 08/05/19->now off  Lactic acid 1.2->2.0  Hepatitis panel 9/12 - neg  On baclofen 10 mg tid and and Bromocriptine 5 mg qid and buspar 30mg  tid for central neurogenic fever  Tachycardia, resolved  HR normal now  Likely due to fever  Hydration with IVF, TF and  free water  On metoprolol to 50 Q8h  EKG ST, rate 120 w/ possible LA enlargement  Troponin series neg x 3    Hyperlipidemia  Home meds:  no statin  LDL 83, goal < 70  Was on lipitor 20 mg daily   D/c statin due to elevated LFT  Elevated LFT, improving  AST - 105-118-98-85   ALT - [phone]  CK 2063-852 [id number]  Hepatitis panel 9/12 - neg  Dysphagia . Secondary to stroke . Dysphagia 1 diet with thin liquid -> NPO due to lethargy with worsening HT  Cortrak w/ TF  Speech on board   Other Stroke Risk Factors  Former Cigarette smoker, quit 3 yrs ago  ETOH use  Substance abuse UDS - positive for THC   Obesity, Body mass index is 28.23 kg/m., recommend weight loss, diet and exercise as appropriate   Other Active Problems  Normocytic anemia - hematology felt d/t ongoing blood draws 10.0->9.5  Hospital day # 26  This patient is critically ill due to lethargy, AMS, worsening HT on CT, fever and at significant risk of neurological worsening, death form hemorrhagic conversion, cerebral edema, brain herniation, status epilepticus, respiratory failure. This patient's care requires constant monitoring of vital signs, hemodynamics, respiratory and cardiac monitoring, review of multiple databases, neurological assessment, discussion with family, other specialists and medical  decision making of high complexity. I spent 45 minutes of neurocritical care time in the care of this patient. I had long discussion with wife at bedside, updated pt current condition, treatment plan and potential prognosis.  She expressed understanding and appreciation.  I also discussed with Dr. Sloan Leiter and Dr. Nelda Marseille.  Matthew Hawking, MD PhD Stroke Neurology 08/14/2019 4:01 PM    To contact Stroke Continuity provider, please refer to [email or web address] After hours, contact General Neurology

## 2019-08-14 NOTE — Progress Notes (Signed)
SLP Cancellation Note  Patient Details Name: Matthew Black MRN: [id number] DOB: 20-Jul-1976   Cancelled treatment:       Reason Eval/Treat Not Completed: Medical issues which prohibited therapy. Patient extremely lethargic, per RN, vomiting this am.   Gabriel Rainwater MA, CCC-SLP     [name] Meryl 08/14/2019, 11:31 AM

## 2019-08-14 NOTE — Progress Notes (Signed)
PROGRESS NOTE    Matthew Black  [id number] DOB: [birth year] DOA: 07/19/2019 PCP: Colon Branch, MD   Brief Narrative: This is from hospital course as per previous provider: Grayland Ormond a 43 y.o.malewithout significant known PMH who presented on 8/25 with AMS as well as L hemiparesis and aphasia. UDS + for THC. CT with large R hemispheric infarct with confluent cytotoxic edema in R ACA and MCA territories and mild mass effect. 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 He was admitted to [facility] with neurology consultation and started on 3% saline. Neurosurgery was consulted and Dr. Kathyrn Sheriff recommended early decompressive hemicraniectomy. He subsequently developed hemorrhagic conversion with hematoma on 9/5; helmet was removed to further release intracranial pressure and heparin was discontinued. LE doppler also indicated DVT in R peroneal, L popliteal, L posterior tibial, and L peroneal veins; IVC filter was placed.  Patient had seizure-like activity, for which he is on Keppra. He was subsequently found to have possible RLL infiltrate and with concern for aspiration he was made NPO and started on Unasyn.CTA was performed and it showed a non-occlusive LLL thrombus on 9/7.Subsequent repeat DVT US was performed 9/8 and showed acute R DVT of the peroneal veins as well as acute L DVT involving common femoral vein, L femoral vein, L proximal profunda vein, L popliteal vein, L posterior tibial veins, and L peroneal veins - and it extends up into the left iliac vein and IVC.  CT abdomen showed large fluid collection in lower right anterior abdominal which could be hematoma, seroma or an abscess.  Neurosurgery consulted and tapped the abdominal hematoma. Hospital course remarkable for persistent fever, Cultures negative so far.  Antibiotics have been stopped .  Assessment & Plan:   Active Problems:   Stroke (cerebrum) (HCC)   Pressure injury of skin   Acute respiratory  failure (HCC)   Endotracheal tube present   Deep vein thrombosis (DVT) of non-extremity vein   Hypokalemia   FUO (fever of unknown origin)   Acute blood loss anemia   SIRS (systemic inflammatory response syndrome) (HCC)   Leukocytosis   Primary hypercoagulable state ([place])   Acute pulmonary embolism without acute cor pulmonale (HCC)   Right MCA/ACA infarct with left hemiparesis: Mental status is gradually improving now. able to follow simple commands. encephalopathy due to massive CVA.  Has right ICA, MCA and ACA occlusions.  Status post hemicraniectomy with abdominal flap implant.  Hemorrhagic conversion of CVA when he was started on heparin for DVT.  Heparin has been resumed.  Neurology/neurosurgery managing.  CT head from 08/11/2019 reviewed by Dr. Viviana Simpler per Dr. Leonie Man okay to continue IV heparin, may transition to p.o. Eliquis at 5 mg p.o. twice daily without loading dose upon discharge.  08/14/2019; patient is more lethargic and vomiting.  Will discuss with neurologist today whether patient will need any repeat CT scans.  Hold oral intake.  Bilateral Lower Extremity DVT (Lt > Rt)/Non-occlusive left lower lobe PE: Currently on heparin.  Also has IVC filter.  Stable abdominal wall hematoma under the abdominal flap incision site.  Has edema of the left lower extremity.  Seizure-like activity: On Keppra as per neurology.  Persistent Fever:Marland Kitchen  Extensive work-up did not reveal any clear source of infection.  Suspected to be  associated  with hemorrhagic CVA. Antibiotics have been stopped. No definite source for infection . Suspected central fever from intracranial process.  Patient treated with bromocriptine. Overnight temperature 100.5.  Urine with sediments, culture  sent.  Will await results before restarting antibiotics.  Abdominal hematoma: CT abdomen showed large fluid collection in lower right anterior abdominal which could be hematoma, seroma .  Neurosurgery consulted and tapped the  abdominal hematoma.  Cultures negative so far.  Elevated CK/Elevated liver enzymes: he was Found on the floor PTA. CK elevated  due to rhabdomyolysis and is improving with IV fluids.  Also has mild elevated liver enzymes.  Viral Hepatitis panel negative  Normocytic anemia: Currently H&H stable.  Dysphagia: Encephalopathy due to massive CVA.  On feeding tube.Speech following ,underwent MBS.Started on dysphagia 1 diet. Today more lethargic and vomiting. Will start patient on bowel regimen, last bowel movement 3 days ago. Patient may need PEG tube feeding, will discuss with neurology.  Nutrition Problem: Inadequate oral intake Etiology: acute illness  DVT prophylaxis: Heparin IV  Code Status: Full Code  Family Communication:  None   Disposition Plan: likely SNF when is stable.  Procedures: Hemicraniectomy  Antimicrobials:  Anti-infectives (From admission, onward)   Start     Dose/Rate Route Frequency Ordered Stop   08/05/19 1000  vancomycin (VANCOCIN) 1,250 mg in sodium chloride 0.9 % 250 mL IVPB  Status:  Discontinued     1,250 mg 166.7 mL/hr over 90 Minutes Intravenous Every 12 hours 08/04/19 1856 08/08/19 0914   08/05/19 0600  ceFEPIme (MAXIPIME) 2 g in sodium chloride 0.9 % 100 mL IVPB  Status:  Discontinued     2 g 200 mL/hr over 30 Minutes Intravenous Every 8 hours 08/04/19 1856 08/08/19 0914   08/04/19 2200  metroNIDAZOLE (FLAGYL) IVPB 500 mg  Status:  Discontinued     500 mg 100 mL/hr over 60 Minutes Intravenous Every 8 hours 08/04/19 1743 08/06/19 1548   08/04/19 1830  vancomycin (VANCOCIN) 2,000 mg in sodium chloride 0.9 % 500 mL IVPB     2,000 mg 250 mL/hr over 120 Minutes Intravenous  Once 08/04/19 1805 08/04/19 2053   08/04/19 1815  ceFEPIme (MAXIPIME) 2 g in sodium chloride 0.9 % 100 mL IVPB     2 g 200 mL/hr over 30 Minutes Intravenous  Once 08/04/19 1805 08/04/19 1842   07/31/19 2200  Ampicillin-Sulbactam (UNASYN) 3 g in sodium chloride 0.9 % 100 mL IVPB   Status:  Discontinued     3 g 200 mL/hr over 30 Minutes Intravenous Every 6 hours 07/31/19 1401 08/04/19 1743   07/31/19 1415  Ampicillin-Sulbactam (UNASYN) 3 g in sodium chloride 0.9 % 100 mL IVPB     3 g 200 mL/hr over 30 Minutes Intravenous STAT 07/31/19 1401 07/31/19 1551   07/19/19 2315  bacitracin 50,000 Units in sodium chloride 0.9 % 500 mL irrigation  Status:  Discontinued       As needed 07/19/19 2315 07/19/19 2339      Subjective:  Patient seen and examined at morning rounds.  No overnight events. He is more lethargic today and also vomiting after night time feeding. No movements on the left side. Objective: Vitals:   08/14/19 0315 08/14/19 0344 08/14/19 0524 08/14/19 0710  BP: 119/89   125/84  Pulse: 88   89  Resp: 17     Temp: (!) 100.5 F (38.1 C)  99.3 F (37.4 C) 99.5 F (37.5 C)  TempSrc: Axillary  Axillary Axillary  SpO2: 100%     Weight:  91.8 kg    Height:        Intake/Output Summary (Last 24 hours) at 08/14/2019 1210 Last data filed at 08/14/2019 0700 Gross per  24 hour  Intake 3323.91 ml  Output 3750 ml  Net -426.09 ml   Filed Weights   08/12/19 0500 08/13/19 0500 08/14/19 0344  Weight: 102.9 kg 103.5 kg 91.8 kg    Examination:  General exam: Resting comfortably, sick looking. HEENT: Eyes open,Oral mucosa moist,   feeding tube, staples on the scalp. Cortrack tube in the left Nare, vomitus on the cloths. respiratory system: Bilateral equal air entry, normal vesicular breath sounds, no wheezes or crackles  Cardiovascular system: S1 & S2 heard, RRR. No JVD, murmurs, rubs, gallops or clicks. Gastrointestinal system: Abdomen is nondistended, soft and nontender.  Normal bowel sounds heard.  Staples on the right lower quadrant with area of swelling around the staples  Central nervous system: Awake, apparently able to follow some commands, apparently able to take oral intake , left hemiparesis Extremities: Left lower extremity edema. Skin: No rashes,  lesions or ulcers,no icterus ,no pallor  Data Reviewed:   CBC: Recent Labs  Lab 08/08/19 0530 08/09/19 0213 08/10/19 0618 08/12/19 0331  WBC 11.2* 10.0 10.7* 10.0  HGB 9.0* 10.0* 10.0* 9.5*  HCT 28.4* 30.4* 29.4* 29.8*  MCV 87.1 86.4 84.7 87.9  PLT 274 278 282 [id number]   Basic Metabolic Panel: Recent Labs  Lab 08/09/19 0213 08/12/19 0331  NA 138 135  K 3.7 3.7  CL 105 104  CO2 25 28  GLUCOSE 112* 117*  BUN 14 14  CREATININE 0.79 0.76  CALCIUM 8.5* 8.2*   GFR: Estimated Creatinine Clearance: 140.8 mL/min (by C-G formula based on SCr of 0.76 mg/dL). Liver Function Tests: Recent Labs  Lab 08/09/19 0213 08/12/19 0331  AST 98* 85*  ALT 65* 164*  ALKPHOS 74 76  BILITOT 0.6 0.5  PROT 6.6 6.4*  ALBUMIN 2.5* 2.5*   No results for input(s): LIPASE, AMYLASE in the last 168 hours. No results for input(s): AMMONIA in the last 168 hours. Coagulation Profile: No results for input(s): INR, PROTIME in the last 168 hours. Cardiac Enzymes: Recent Labs  Lab 08/10/19 0618 08/11/19 0338 08/12/19 0331 08/13/19 0612 08/14/19 0456  CKTOTAL 852* 595* 453* 341 250   BNP (last 3 results) No results for input(s): PROBNP in the last 8760 hours. HbA1C: No results for input(s): HGBA1C in the last 72 hours. CBG: Recent Labs  Lab 08/13/19 1800 08/13/19 2026 08/13/19 2329 08/14/19 0354 08/14/19 0808  GLUCAP 117* 106* 104* 99 118*   Lipid Profile: No results for input(s): CHOL, HDL, LDLCALC, TRIG, CHOLHDL, LDLDIRECT in the last 72 hours. Thyroid Function Tests: No results for input(s): TSH, T4TOTAL, FREET4, T3FREE, THYROIDAB in the last 72 hours. Anemia Panel: No results for input(s): VITAMINB12, FOLATE, FERRITIN, TIBC, IRON, RETICCTPCT in the last 72 hours. Sepsis Labs: No results for input(s): PROCALCITON, LATICACIDVEN in the last 168 hours.  Recent Results (from the past 240 hour(s))  Culture, Urine     Status: None   Collection Time: 08/04/19 12:48 PM   Specimen:  Urine, Random  Result Value Ref Range Status   Specimen Description URINE, RANDOM  Final   Special Requests NONE  Final   Culture   Final    NO GROWTH Performed at [facility], [address], [place], [place] [address]    Report Status 08/05/2019 FINAL  Final  Culture, blood (routine x 2)     Status: None   Collection Time: 08/04/19  6:59 PM   Specimen: BLOOD LEFT ARM  Result Value Ref Range Status   Specimen Description BLOOD LEFT  ARM  Final   Special Requests   Final    BOTTLES DRAWN AEROBIC ONLY Blood Culture results may not be optimal due to an inadequate volume of blood received in culture bottles   Culture   Final    NO GROWTH 5 DAYS Performed at [facility], [place] [address], [place], [place] [address]    Report Status 08/09/2019 FINAL  Final  Culture, blood (routine x 2)     Status: None   Collection Time: 08/04/19  6:59 PM   Specimen: BLOOD LEFT HAND  Result Value Ref Range Status   Specimen Description BLOOD LEFT HAND  Final   Special Requests   Final    BOTTLES DRAWN AEROBIC ONLY Blood Culture results may not be optimal due to an inadequate volume of blood received in culture bottles   Culture   Final    NO GROWTH 5 DAYS Performed at [facility], [place] [address], [place], [place] [address]    Report Status 08/09/2019 FINAL  Final  Body fluid culture     Status: None   Collection Time: 08/05/19 11:33 AM   Specimen: Body Fluid  Result Value Ref Range Status   Specimen Description FLUID ABDOMEN  Final   Special Requests NONE  Final   Gram Stain   Final    RARE WBC PRESENT,BOTH PMN AND MONONUCLEAR NO ORGANISMS SEEN    Culture   Final    NO GROWTH 3 DAYS Performed at [facility], [address] [place], [place] [address]    Report Status 08/08/2019 FINAL  Final  Culture, blood (routine x 2)     Status: None   Collection Time: 08/06/19  4:30 PM   Specimen: BLOOD  Result Value Ref Range Status   Specimen Description BLOOD LEFT  ANTECUBITAL  Final   Special Requests AEROBIC BOTTLE ONLY Blood Culture adequate volume  Final   Culture   Final    NO GROWTH 5 DAYS Performed at [place] [address] [place], [place] [address]    Report Status 08/11/2019 FINAL  Final  Culture, blood (routine x 2)     Status: None   Collection Time: 08/06/19  4:42 PM   Specimen: BLOOD LEFT HAND  Result Value Ref Range Status   Specimen Description BLOOD LEFT HAND  Final   Special Requests NONE  Final   Culture   Final    NO GROWTH 5 DAYS Performed at [facility], [place] [address] [place], [place] [address]    Report Status 08/11/2019 FINAL  Final    Radiology Studies: No results found. Scheduled Meds: . baclofen  10 mg Oral TID  . bromocriptine  5 mg Oral QID  . busPIRone  30 mg Oral TID  . chlorhexidine  15 mL Mouth Rinse BID  . Chlorhexidine Gluconate Cloth  6 each Topical Daily  . famotidine  20 mg Per Tube BID  . feeding supplement (ENSURE ENLIVE)  237 mL Oral BID BM  . feeding supplement (PRO-STAT SUGAR FREE 64)  30 mL Per Tube TID  . free water  150 mL Per Tube Q8H  . levETIRAcetam  500 mg Per Tube BID  . mouth rinse  15 mL Mouth Rinse q12n4p  . metoprolol tartrate  50 mg Per Tube Q8H  . ondansetron (ZOFRAN) IV  4 mg Intravenous Q6H  . sodium chloride flush  10-40 mL Intracatheter Q12H   Continuous Infusions: . sodium chloride  10 mL/hr at 08/01/19 1715  . sodium chloride 50 mL/hr at 08/12/19 2125  . feeding supplement (OSMOLITE 1.5 CAL) 1,000 mL (08/14/19 0516)  . heparin 1,300 Units/hr (08/14/19 0626)     LOS: 26 days    Total time spent: 25 minutes   Barb Merino, MD [facility] If 7PM-7AM, please contact night-coverage [email or web address] Password [email or web address] 08/14/2019, 12:10 PM

## 2019-08-14 NOTE — Progress Notes (Signed)
ANTICOAGULATION CONSULT NOTE - Follow Up Consult  Pharmacy Consult for heparin Indication: PE/DVT in setting of CVA  Labs: Recent Labs    08/12/19 0331  08/13/19 0612 08/13/19 0720 08/13/19 1806 08/14/19 0456  HGB 9.5*  --   --   --   --   --   HCT 29.8*  --   --   --   --   --   PLT 306  --   --   --   --   --   HEPARINUNFRC 1.90*   < > 1.02* 0.23* 0.57 0.67  CREATININE 0.76  --   --   --   --   --   CKTOTAL 453*  --  341  --   --  250   < > = values in this interval not displayed.    Assessment: 43yo male supratherapeutic on heparin with higher heparin level despite rate decrease last pm; no gtt issues or signs of bleeding per RN.  Goal of Therapy:  Heparin level 0.3-0.5 units/ml   Plan:  Will decrease heparin gtt by 2 units/kg/hr to 1300 units/hr and check level in 6 hours.    Wynona Neat, PharmD, BCPS  08/14/2019,6:25 AM

## 2019-08-14 NOTE — Progress Notes (Signed)
Zofran ordered for vomiting.  Physician aware of patient decreased responsivness

## 2019-08-14 NOTE — Progress Notes (Signed)
NAME:  Matthew Black, MRN:  [id number], DOB:  09-12-1976, LOS: 64 ADMISSION DATE:  07/19/2019, CONSULTATION DATE:  8/25 REFERRING MD:  EDP, CHIEF COMPLAINT:  Stroke   Brief History   43yo male with no known PMH presented 8/25 after being found down at work at [employer] by a coworker covered in vomit and urine.  Last seen normal by his wife 830am 8/24 before he left for work.  CT head showed large R hemisphere infarct with edema and mild mass effect. He was admitted by neurology, placed on 3% saline and PCCM asked to consult for ICU assistance.   Past Medical History  None.  Significant Hospital Events   8/25 > admit. To OR for decompressive crani; returned on vent  8/26: Sedated, had possible seizure activity earlier today involving rhythmic movement of the right hand/wrist and right leg currently on propofol infusion 8/27: following commands on R and some withdraw to pain on LLE. Not opening eyes. Remains on some propofol. Will add fentanyl prn. IVC filter placed late in day 8/27  Consults:  PCCM.  Procedures:  8/26: Picc>> 8/26 ETT>> 8/27 IVC filter  Significant Diagnostic Tests:  MR brain 8/25>>> The right ICA is occluded from its origin. The right ICA terminus, right ACA and MCA also appear occluded.  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 Cytotoxic edema but no convincing hemorrhage. Stable mild mass effect with trace leftward midline shift.  CT head 8/26:  cerebral edema decompressing thru the craniotomy  Echo 8/25 > normal LVEF 65% CTA head neck 8/27: 1. Stable from prior MRA. 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 No evidence of atherosclerosis or vasculopathy in the other vessels. 2. Cytotoxic edema causes 5 mm of midline shift and brain bulging through the craniectomy defect. Mild petechial hemorrhage is seen at the basal ganglia. BLE DVT vasc US 8/28: acute DVT R peroneal vein, L popliteal  vein, L posterior tibial vein, L peroneal veins  Head CT 08/14/19 with increase in hemorrhage, edema, mass effect and compression of ventricle   Micro Data:  COVID 8/25 > negative BCx 8/25> no growth to date   Antimicrobials:  None.  Interim history/subjective:  PCCM called back for increased bleeding, worsening mental status and concern for airway protection  Objective   Blood pressure 124/80, pulse 89, temperature 100.2 F (37.9 C), temperature source Axillary, resp. rate 19, height 5\' 11"  (1.803 m), weight 91.8 kg, SpO2 98 %.        Intake/Output Summary (Last 24 hours) at 08/14/2019 1527 Last data filed at 08/14/2019 1400 Gross per 24 hour  Intake 3195.15 ml  Output 3550 ml  Net -354.85 ml   Filed Weights   08/12/19 0500 08/13/19 0500 08/14/19 0344  Weight: 102.9 kg 103.5 kg 91.8 kg    Examination: General:  Chronically ill appearing male s/p right sided crani with significant swelling on right cranium Neuro: opens eyes but not following commands, head tilted to the right, moves right to command, left completely paralyzed, weak gag and no cough to command HEENT: Surgical dressing CDI, PERRL with no EOM to command and NGT in place Cardiovascular:  RRR, Nl S1/S2 and -M/R/G Lungs:  Diminished diffuse Abdomen:  Soft, non-distended. Palpable bone flap RLQ. Hyperactive BS. Surgical incision CDI.  Musculoskeletal:  No acute deformity Skin:  Intact, MMM  I reviewed CXR myself, no acute disease noted, low volume however  Assessment & Plan:  Now resolved acute respiratory failure requiring mechanical ventilation, in setting of inadequate airway protection Currently on room air.  Adequate cough - Transfer to the ICU - Low threshold for intubation given concerns for airway protection but wife would like to wait - Remains at aspiration risk - Titrate O2 for sat of 88-92%  Large right hemispheric infarct (ICA,MCA & ACA occlusion) s/p right decompressive hemicraniectomy  8/25 Has completed course of hypertonic saline Now 6 days out from event.  Neurological status unlikely to change in intermediate timeframe. - Core track for feeding - Neuro to manage today's events of bleed, cytotoxic edema and expansion. - Need to discuss realistic plan of care here  Possible seizure  LTM did not show seizure activity. Cortical dysfunction of R frontal region, likely 2/2 CVA and cerebral edema - Continue Keppra  Acute encephalopathy  - Minimize sedation as safely possible, RASS goal 0 to -1 - RN ICU delirium precaution bundle   DVT: acute DVT R peroneal vein, L popliteal vein, L posterior tibial vein, L peroneal veins - IVC filter placed 8/27 by IR  - Hold heparin and keep IVC filter in place - Should anticoagulate for 3 months minimum but unsafe right now  - Retrieve filter once anticoagulation established.   I had an extensive discussion with wife.  She would like everything done and believes patient will walk again with assistance as his new baseline.  I expressed concerns about airway protection but she believes patient is ok for now and would like to wait on any aggressive interventions.  The patient is critically ill with multiple organ systems failure and requires high complexity decision making for assessment and support, frequent evaluation and titration of therapies, application of advanced monitoring technologies and extensive interpretation of multiple databases.   Critical Care Time devoted to patient care services described in this note is  45  Minutes. This time reflects time of care of this signee Dr Jennet Maduro. This critical care time does not reflect procedure time, or teaching time or supervisory time of PA/NP/Med student/Med Resident etc but could involve care discussion time.  Rush Farmer, M.D. [facility] Pulmonary/Critical Care Medicine. Pager: [phone]. After hours pager: [phone].

## 2019-08-14 NOTE — Progress Notes (Signed)
ANTICOAGULATION CONSULT NOTE  Pharmacy Consult:  Heparin Indication:  DVT/PE  Allergies  Allergen Reactions  . Hydrocodone Nausea Only    Patient Measurements: Height: 5\' 11"  (180.3 cm) Weight: 202 lb 6.1 oz (91.8 kg) IBW/kg (Calculated) : 75.3 Heparin Dosing Weight: 96 kg  Vital Signs: Temp: 100.2 F (37.9 C) (09/20 1116) Temp Source: Axillary (09/20 1116) BP: 130/84 (09/20 1116) Pulse Rate: 95 (09/20 1116)  Labs: Recent Labs    08/12/19 0331  08/13/19 0612  08/13/19 1806 08/14/19 0456 08/14/19 1217  HGB 9.5*  --   --   --   --   --   --   HCT 29.8*  --   --   --   --   --   --   PLT 306  --   --   --   --   --   --   HEPARINUNFRC 1.90*   < > 1.02*   < > 0.57 0.67 0.62  CREATININE 0.76  --   --   --   --   --   --   CKTOTAL 453*  --  341  --   --  250  --    < > = values in this interval not displayed.    Estimated Creatinine Clearance: 140.8 mL/min (by C-G formula based on SCr of 0.76 mg/dL).   Assessment: 4 YOM presented with acute ischemic CVA and did not receive tPA.  There was an incidental finding of DVT and IVC filter was placed on 07/21/19 as patient was not a candidate for [facility] due to craniectomy.  He was cleared to start IV heparin, but then stopped on 07/30/19 due to right frontal lobe hemorrhage.  CTA on 08/01/19 showed non-occlusive thrombus and repeat Doppler on 08/02/19 positive for bilateral DVTs.  Heparin restarted on 08/02/19 given extensive DVTs and PE.    Heparin level slightly supra-therapeutic 0.62.  It has been difficult to maintain therapeutic heparin levels.  No bleeding reported.  Goal of Therapy:  Heparin level 0.3-0.5 units/ml Monitor platelets by anticoagulation protocol: Yes    Plan:   Reduce heparin gtt to 1150 units/hr F/U AM labs  Nicoletta Dress, PharmD PGY2 Infectious Disease Pharmacy Resident  08/14/2019, 1:16 PM

## 2019-08-15 ENCOUNTER — Inpatient Hospital Stay (HOSPITAL_COMMUNITY): Payer: Medicaid Other

## 2019-08-15 DIAGNOSIS — G9340 Encephalopathy, unspecified: Secondary | ICD-10-CM

## 2019-08-15 LAB — COMPREHENSIVE METABOLIC PANEL
ALT: 89 U/L — ABNORMAL HIGH (ref 0–44)
AST: 53 U/L — ABNORMAL HIGH (ref 15–41)
Albumin: 2.7 g/dL — ABNORMAL LOW (ref 3.5–5.0)
Alkaline Phosphatase: 86 U/L (ref 38–126)
Anion gap: 11 (ref 5–15)
BUN: 15 mg/dL (ref 6–20)
CO2: 22 mmol/L (ref 22–32)
Calcium: 8.4 mg/dL — ABNORMAL LOW (ref 8.9–10.3)
Chloride: 105 mmol/L (ref 98–111)
Creatinine, Ser: 0.86 mg/dL (ref 0.61–1.24)
GFR calc Af Amer: >60 mL/min (ref >60)
GFR calc non Af Amer: >60 mL/min (ref >60)
Glucose, Bld: 130 mg/dL — ABNORMAL HIGH (ref 70–99)
Potassium: 3.8 mmol/L (ref 3.5–5.1)
Sodium: 138 mmol/L (ref 135–145)
Total Bilirubin: 0.5 mg/dL (ref 0.3–1.2)
Total Protein: 7 g/dL (ref 6.5–8.1)

## 2019-08-15 LAB — CBC WITH DIFFERENTIAL/PLATELET
Abs Immature Granulocytes: 0.11 10*3/uL — ABNORMAL HIGH (ref 0.00–0.07)
Basophils Absolute: 0 10*3/uL (ref 0.0–0.1)
Basophils Relative: 0 %
Eosinophils Absolute: 0 10*3/uL (ref 0.0–0.5)
Eosinophils Relative: 0 %
HCT: 31.1 % — ABNORMAL LOW (ref 39.0–52.0)
Hemoglobin: 10.3 g/dL — ABNORMAL LOW (ref 13.0–17.0)
Immature Granulocytes: 1 %
Lymphocytes Relative: 14 %
Lymphs Abs: 1.3 10*3/uL (ref 0.7–4.0)
MCH: 27.9 pg (ref 26.0–34.0)
MCHC: 33.1 g/dL (ref 30.0–36.0)
MCV: 84.3 fL (ref 80.0–100.0)
Monocytes Absolute: 1 10*3/uL (ref 0.1–1.0)
Monocytes Relative: 11 %
Neutro Abs: 6.7 10*3/uL (ref 1.7–7.7)
Neutrophils Relative %: 74 %
Platelets: 220 10*3/uL (ref 150–400)
RBC: 3.69 MIL/uL — ABNORMAL LOW (ref 4.22–5.81)
RDW: 14.1 % (ref 11.5–15.5)
WBC: 9.1 10*3/uL (ref 4.0–10.5)
nRBC: 0 % (ref 0.0–0.2)

## 2019-08-15 LAB — HEPARIN LEVEL (UNFRACTIONATED): Heparin Unfractionated: <0.1 IU/mL — ABNORMAL LOW (ref 0.30–0.70)

## 2019-08-15 LAB — GLUCOSE, CAPILLARY
Glucose-Capillary: 110 mg/dL — ABNORMAL HIGH (ref 70–99)
Glucose-Capillary: 111 mg/dL — ABNORMAL HIGH (ref 70–99)
Glucose-Capillary: 112 mg/dL — ABNORMAL HIGH (ref 70–99)
Glucose-Capillary: 112 mg/dL — ABNORMAL HIGH (ref 70–99)
Glucose-Capillary: 116 mg/dL — ABNORMAL HIGH (ref 70–99)
Glucose-Capillary: 98 mg/dL (ref 70–99)

## 2019-08-15 LAB — MAGNESIUM: Magnesium: 2.1 mg/dL (ref 1.7–2.4)

## 2019-08-15 LAB — PHOSPHORUS: Phosphorus: 3.5 mg/dL (ref 2.5–4.6)

## 2019-08-15 LAB — URINE CULTURE: Culture: >=100000 — AB

## 2019-08-15 MED ORDER — PIPERACILLIN-TAZOBACTAM 3.375 G IVPB
3.3750 g | Freq: Three times a day (TID) | INTRAVENOUS | Status: AC
  Administered 2019-08-15 – 2019-08-20 (×16): 3.375 g via INTRAVENOUS
  Filled 2019-08-15 (×16): qty 50

## 2019-08-15 MED ORDER — TRAMADOL HCL 50 MG PO TABS
50.0000 mg | ORAL_TABLET | Freq: Four times a day (QID) | ORAL | Status: DC | PRN
  Administered 2019-08-15 – 2019-09-04 (×9): 50 mg
  Filled 2019-08-15 (×8): qty 1

## 2019-08-15 MED ORDER — TRAMADOL HCL 50 MG PO TABS
50.0000 mg | ORAL_TABLET | Freq: Four times a day (QID) | ORAL | Status: DC | PRN
  Filled 2019-08-15: qty 1

## 2019-08-15 MED ORDER — ACETAMINOPHEN 160 MG/5ML PO SOLN
650.0000 mg | Freq: Four times a day (QID) | ORAL | Status: DC
  Administered 2019-08-15 – 2019-08-17 (×8): 650 mg
  Filled 2019-08-15 (×8): qty 20.3

## 2019-08-15 MED ORDER — ACETAMINOPHEN 325 MG PO TABS
650.0000 mg | ORAL_TABLET | Freq: Four times a day (QID) | ORAL | Status: DC
  Administered 2019-08-17: 05:00:00 650 mg via ORAL
  Filled 2019-08-15 (×2): qty 2

## 2019-08-15 MED ORDER — OSMOLITE 1.5 CAL PO LIQD
1000.0000 mL | ORAL | Status: DC
  Administered 2019-08-15 – 2019-08-28 (×13): 1000 mL
  Filled 2019-08-15 (×21): qty 1000

## 2019-08-15 MED ORDER — ACETAMINOPHEN 650 MG RE SUPP: 650.0000 mg | Freq: Four times a day (QID) | RECTAL | Status: DC

## 2019-08-15 MED ORDER — SODIUM CHLORIDE 0.9 % IV SOLN: INTRAVENOUS | Status: DC

## 2019-08-15 NOTE — Progress Notes (Signed)
NAME:  Matthew Black, MRN:  [id number], DOB:  [birth year], LOS: 67 ADMISSION DATE:  07/19/2019, CONSULTATION DATE:  8/25 REFERRING MD:  EDP, CHIEF COMPLAINT:  Stroke   Brief History   43yo male with no known PMH presented 8/25 after being found down at work at [employer] by a coworker covered in vomit and urine.  Last seen normal by his wife 830am 8/24 before he left for work.  CT head showed large R hemisphere infarct with edema and mild mass effect. He was admitted by neurology, placed on 3% saline and PCCM asked to consult for ICU assistance at the beginning of pt's hospitalization   On 08/14/19   Past Medical History  None.  Significant Hospital Events   8/25 > admit. To OR for decompressive crani; returned on vent  8/26: Sedated, had possible seizure activity earlier today involving rhythmic movement of the right hand/wrist and right leg currently on propofol infusion 8/27: following commands on R and some withdraw to pain on LLE. Not opening eyes. Remains on some propofol. Will add fentanyl prn. IVC filter placed late in day 8/27 9/20 Pt became more lethargic and vomiting, hemorrhagic conversion on CT, transferred back to ICU  Consults:  PCCM.  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 The right ICA terminus, right ACA and MCA also appear occluded.  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 Cytotoxic edema but no convincing hemorrhage. Stable mild mass effect with trace leftward midline shift.  CT head 8/26:  cerebral edema decompressing thru the craniotomy  Echo 8/25 > normal LVEF 65% CTA head neck 8/27: 1. Stable from prior MRA. 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 No evidence of atherosclerosis or vasculopathy in the other vessels. 2. Cytotoxic edema causes 5 mm of midline shift and brain  bulging through the craniectomy defect. Mild petechial hemorrhage is seen at the basal ganglia. BLE DVT vasc US 8/28: acute DVT R peroneal vein, L popliteal vein, L posterior tibial vein, L peroneal veins  Head CT 08/14/19 with increase in hemorrhage, edema, mass effect and compression of ventricle   Micro Data:  COVID 8/25 > negative BCx 8/25> neg 9/19 UC>>Pseudomonas>>intermediate sensitivity to Ceftaz and Cefepime 9/12 BCx2>>negative  9/11 Abdominal fluid>>neg  Antimicrobials:  Unasyn 9/6-9/10 Cefepime 9/10-9/13 Flagyl 9/10-9/12 Vancomycin 9/10-9/13 Zosyn 9/21-  Interim history/subjective:  Stable on room air overnight, poor neurologic exam  Objective   Blood pressure 131/88, pulse 94, temperature (!) 100.7 F (38.2 C), temperature source Axillary, resp. rate 17, height 5\' 11"  (1.803 m), weight 98.6 kg, SpO2 98 %.        Intake/Output Summary (Last 24 hours) at 08/15/2019 0948 Last data filed at 08/15/2019 0700 Gross per 24 hour  Intake 1250.98 ml  Output 2200 ml  Net -949.02 ml   Filed Weights   08/13/19 0500 08/14/19 0344 08/15/19 0500  Weight: 103.5 kg 91.8 kg 98.6 kg     General:  Chronically ill-appearing M in no acute distress, non-toxic appearing HEENT: MM pink/moist, R crani surgical site with staples in place, site is clean and dry Neuro: Withdraws to pain, spontaneously moves bilateral LE, not opening eyes, head tilted to the R CV: s1s2 RRR, no m/r/g PULM:  Room air, lungs CTAB, decreased breath sounds bilateral bases, no signs of respiratory distress GI: soft,  Active  BS, bone flap RLQ  Extremities: warm/dry, no edema  Skin: no rashes or lesions    Assessment & Plan:   Concern for respiratory failure following 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 and cerebral edema, s/p hemicraniectomy and Abd/ flap implant -Worsening neurologic exam since 9/20 and transferred to the ICU for possible airway protection    -Neurology following  -Completed hypertonic saline -heparin was started for PE/DVT, now stopped P: -Poor neurologic exam, but currently is protecting his airway and on room air, continue  -Continue to monitor closely and low threshold for intubation -Maintain O2 88-92% -Poor prognosis given cytotoxic edema and hemorrhagic conversion     Possible Seizures -Seizure activity on the floor and was started on Keppra -EEG 8/27 without seizure activity P: -continue Keppra for seizure prophylaxis    Persistent Fevers, pseudomonal UTI -Initial Concern for aspiration, started on Unasyn -Urine culture resulted today 9/21 with pseudomonas intermediate sensitivity to Cefepime and Ceftaz P: -Initiate Zosyn -Continue Bromocriptine and change Tylenol to scheduled   Non-occlusive RLL PE and bilateral LE DVT -Holding heparin -IVC filter placed 8/27 by IR   Abdominal Hematoma -tapped by surgery, cultures with no growth    Elevated LFT's -improving -hepatitis panel negative    CRITICAL CARE Performed by: Otilio Carpen Rosemae Mcquown   Total critical care time: 50 minutes  Critical care time was exclusive of separately billable procedures and treating other patients.  Critical care was necessary to treat or prevent imminent or life-threatening deterioration, Large CVA with hemorrhagic conversion.  Critical care was time spent personally by me on the following activities: development of treatment plan with patient and/or surrogate as well as nursing, discussions with consultants, evaluation of patient's response to treatment, examination of patient, obtaining history from patient or surrogate, ordering and performing treatments and interventions, ordering and review of laboratory studies, ordering and review of radiographic studies, pulse oximetry and re-evaluation of patient's condition.   Otilio Carpen Rolene Andrades, PA-C [facility]  Pager# [phone], if no answer [phone]

## 2019-08-15 NOTE — Progress Notes (Addendum)
  Speech Language Pathology Treatment: Dysphagia;Cognitive-Linquistic  Patient Details Name: Matthew Black MRN: [id number] DOB: Dec 08, 1975 Today's Date: 08/15/2019 Time: 0902-0920 SLP Time Calculation (min) (ACUTE ONLY): 18 min  Assessment / Plan / Recommendation Clinical Impression  Pt presented with R sided preference and neck stiffness, with baseline tremors of the R leg. Eyes inconsistently opened with minimal evidence of intentional eye gaze. Pt did not attempt verbalizations and did not follow commands to unclench his jaw or swallow during swallowing tx, but did follow simple one step commands afterward including: holding his R hand up, putting it into a fist, giving a thumbs up, and raising his pinky finger (with verbal cueing). Pt demonstrated understanding when asked to look to his L with an eye gaze shift, but did not move his head or neck. Continue cognitive communication tx, including R hand movements (thumbs up, thumbs down) to answer y/n questions and communicate wants/needs.  Swallowing tx focused on tolerance of ice chips and water. Oral care was provided to buccal and dental areas, but was unable to be completed lingually due to pt demonstrating a bite and hold reflex when toothette is placed behind the teeth. Pt opened his mouth to accept ice chips and water via spoon, with well-timed initiation of swallow with spoon presentations with thin and no s/sx of aspiration. Given thin via straw he created a seal and suctioned independently, produced an immediate cough after multiple swallows. Due to pts toleration of ice and teaspoons of water, recommend NPO with ice chips for comfort when alert after oral care.  Instrumental study dependent on increased alertness and awareness.    HPI HPI: Pt is a 43 y.o. with no known PMH who presents on 8/25 after being found down at work, nonverbal with L hemiparesis. 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  Stable trace midline shift. S/p hemicraniectomy with abdominal flap implant. Intubated 8/26-8/31. CXR worsened atelectasis in the right lower lobe.      SLP Plan  Continue with current plan of care       Recommendations  Diet recommendations: NPO;Other(comment)(Ice chips for comfort ) Medication Administration: Via alternative means                Oral Care Recommendations: Oral care prior to ice chip/H20;Oral care QID Follow up Recommendations: 24 hour supervision/assistance;Inpatient Rehab SLP Visit Diagnosis: Dysphagia, unspecified (R13.10) Plan: Continue with current plan of care                       Minami Arriaga 08/15/2019, 10:00 AM

## 2019-08-15 NOTE — Progress Notes (Signed)
Physical Therapy Treatment Patient Details Name: Matthew Black MRN: [id number] DOB: [birth year] Today's Date: 08/15/2019    History of Present Illness Pt is a 43 y.o. M with no known PMH who presents on 8/25 after being found down at work, nonverbal with L hemiparesis. CT head showing large R hemisphere infarct with edema and mild mass effect. 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 Stable trace midline shift. S/p hemicraniectomy for  edema with abdominal flap implant. BLE DVT vasc US 8/28: acute DVT bilateral lower extremities, s/p IVC placement.  On 9/21 Repeat CT stable large hemorrhagic conversion.    PT Comments    Patient tolerated in bed PROM/AAROM this session and seemed to like cervical ROM.  Limited to in bed this session due to above changes in status and wife in room with questions about mobilizing while not on anticoagulation with DVT's.  Will continue skilled PT during acute stay and progress to more mobility when medically stable.    Follow Up Recommendations  SNF     Equipment Recommendations       Recommendations for Other Services       Precautions / Restrictions Precautions Precautions: Fall Precaution Comments: L hemiparesis, R skull missing, abd bone flap Required Braces or Orthoses: Other Brace Other Brace: helmet     Mobility  Bed Mobility Overal bed mobility: Needs Assistance             General bed mobility comments: scooted up in bed with +2 A for positioning; did not assist to EOB as wife in room and concern regarding hemorrhagic conversion  Transfers                    Ambulation/Gait                 Stairs             Wheelchair Mobility    Modified Rankin (Stroke Patients Only) Modified Rankin (Stroke Patients Only) Pre-Morbid Rankin Score: No symptoms Modified Rankin: Severe disability     Balance                                             Cognition Arousal/Alertness: Lethargic Behavior During Therapy: Flat affect Overall Cognitive Status: Difficult to assess                                 General Comments: Pt with eyes closed and not following commands throughout      Exercises Other Exercises Other Exercises: PROM L UE with noted restriction at end range and some crepitus felt, but noted no sulcus sign Other Exercises: PROM L LE and AAROM R UE; actively moving R LE in bed Other Exercises: cervical AROM away from R rotation and stretch into L rotation/lateral flexion as able with positioning end of session with prop under R side for more neutral alignment    General Comments        Pertinent Vitals/Pain Faces Pain Scale: No hurt    Home Living                      Prior Function            PT Goals (current goals can now be found  in the care plan section) Progress towards PT goals: Not progressing toward goals - comment(limited tx due to hemorrhagic conversion of stroke)    Frequency    Min 2X/week      PT Plan Current plan remains appropriate    Co-evaluation              AM-PAC PT "6 Clicks" Mobility   Outcome Measure  Help needed turning from your back to your side while in a flat bed without using bedrails?: Total Help needed moving from lying on your back to sitting on the side of a flat bed without using bedrails?: Total Help needed moving to and from a bed to a chair (including a wheelchair)?: Total Help needed standing up from a chair using your arms (e.g., wheelchair or bedside chair)?: Total Help needed to walk in hospital room?: Total Help needed climbing 3-5 steps with a railing? : Total 6 Click Score: 6    End of Session   Activity Tolerance: Treatment limited secondary to medical complications (Comment) Patient left: in bed;with call bell/phone within reach;with family/visitor present   PT Visit Diagnosis: Hemiplegia and hemiparesis;Other symptoms  and signs involving the nervous system (R29.898);Other abnormalities of gait and mobility (R26.89) Hemiplegia - Right/Left: Left Hemiplegia - dominant/non-dominant: Non-dominant Hemiplegia - caused by: Cerebral infarction     Time: [id number] PT Time Calculation (min) (ACUTE ONLY): 17 min  Charges:  $Therapeutic Exercise: 8-22 mins                     [name], PT Acute Rehabilitation Services [phone] 08/15/2019    [name] 08/15/2019, 6:02 PM

## 2019-08-15 NOTE — Progress Notes (Signed)
STROKE TEAM PROGRESS NOTE   INTERVAL HISTORY Patient more awake alert this am. Mild lethargic but open eyes and following commands on the right. Repeat CT stable large hemorrhagic conversion. BP stable. Will resume TF during the day. Pending speech.   Vitals:   08/15/19 0700 08/15/19 0800 08/15/19 0900 08/15/19 1000  BP: (!) 137/101 131/88 133/89 135/89  Pulse: (!) 109 94 96 91  Resp: 18 17 (!) 22 (!) 22  Temp:  (!) 100.7 F (38.2 C)  (!) 102.5 F (39.2 C)  TempSrc:  Axillary  Axillary  SpO2: 98% 98% 95% 96%  Weight:      Height:        CBC:  Recent Labs  Lab 08/12/19 0331 08/15/19 0607  WBC 10.0 9.1  NEUTROABS  --  6.7  HGB 9.5* 10.3*  HCT 29.8* 31.1*  MCV 87.9 84.3  PLT 306 [id number]    Basic Metabolic Panel:  Recent Labs  Lab 08/12/19 0331 08/15/19 0607  NA 135 138  K 3.7 3.8  CL 104 105  CO2 28 22  GLUCOSE 117* 130*  BUN 14 15  CREATININE 0.76 0.86  CALCIUM 8.2* 8.4*  MG  --  2.1  PHOS  --  3.5    IMAGING Ct Head Wo Contrast Ct Cervical Spine Wo Contrast  07/19/2019 1132 1. 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 2. No associated hemorrhage and mild intracranial mass effect at this time, including trace leftward midline shift. 3. Evidence of large vessel occlusion: Hyperdensity of the right ICA terminus, the right A1 and MCA. 4. Unaffected brain parenchyma appears negative. 5.  No acute traumatic injury identified in the cervical spine.   Mr Brain 17 Contrast Mr Angio Head Wo Contrast Mr Angio Neck Wo Contrast 07/19/2019 1330 1. 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 The right ICA terminus, right ACA and MCA also appear occluded. 2. 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 Cytotoxic edema but no convincing hemorrhage. Stable mild mass effect with trace leftward midline shift. 3. Intracranial MRA is degraded by motion artifact, but no other large vessel occlusion is  suspected. There appears to be lobes reperfusion of the right PCA. 4. The left hemisphere and posterior fossa brain parenchyma appears normal.   Ct Head Wo Contrast 07/19/2019 1532 Similar appearance to earlier. Low-density and swelling of the right hemisphere in the ACA and MCA territories with mass effect and right-to-left shift of 5-6 mm. No hemorrhagic transformation at this time.   Ct Head Wo Contrast 07/20/2019 0444 1. Acute right ACA and MCA territory infarct with progressive swelling that has decompressed through the craniectomy defect. 2. No acute hemorrhage or new infarction.   Ct Angio Head W Or Wo Contrast Ct Angio Neck W Or Wo Contrast 07/21/2019 1. Stable from prior MRA. 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 No evidence of atherosclerosis or vasculopathy in the other vessels. 2. Cytotoxic edema causes 5 mm of midline shift and brain bulging through the craniectomy defect. Mild petechial hemorrhage is seen at the basal ganglia.   Ct Head Wo Contrast 07/23/2019 1. Unchanged appearance of massive right MCA and ACA territory infarcts with parenchyma extending through decompressive craniectomy. 2. Small focus of suspected hemorrhage adjacent to the right caudate head.  07/30/2019 1. Decreasing mass effect within large right anterior MCA and ACA territory infarct. 2. Midline shift is no  longer present. There is decreased effacement of the right lateral ventricle. 3. Infarcted brain tissue still herniates through the craniectomy site. 4. New parenchymal hemorrhage within the infarcted tissue anteriorly measures 2.5 x 2.3 x 2.7 cm. 5. No new infarct.  08/01/2019  1. Stable head CT since 07/30/2019. 2. Again noted is a large infarct involving the right MCA and right ACA territories with brain tissue herniating through the right craniotomy defect. 3. Parenchymal hemorrhage in the right anterior cortex has minimally changed. Stable  petechial hemorrhage in the right basal ganglia region.   08/11/2019 1. New patchy parenchymal hemorrhages along the periphery of the infarcted RIGHT frontoparietal lobe, compatible with hemorrhagic conversion, largest focus underlying the RIGHT frontal bone measures 1.3 cm greatest dimension. 2. Previously described dominant focus of hemorrhage along the anterior margin of the craniectomy site has decreased in size and density compared to previous exams, compatible with expected evolution. 3. Stable herniation of infarcted brain through the RIGHT frontal-parietal-temporal craniectomy site. 4. No additional mass effect or midline shift on today's exam.    Ct Angio Chest Pe W Or Wo Contrast 08/01/2019 Nonocclusive thrombus seen within the left posterior lower lobe segmental artery.    [place] ABD Limited 08/11/2019 Suspected mildly complex seroma versus liquified hematoma in the superficial soft tissues of the RIGHT abdomen, measuring 7.9 cardiomyopathy greatest dimension, surrounding patient's calvarium (location of patient's skull status post partial craniectomy), appearance less suggestive of abscess. The collection seen today by ultrasound is similar to the appearance of the collection on earlier CT abdomen of 08/04/2019.  2D Echocardiogram 07/20/2019  1. The left ventricle has normal systolic function with an ejection fraction of 60-65%. The cavity size was normal. Left ventricular diastolic parameters were normal.  2. The right ventricle has normal systolic function. The cavity was normal. There is no increase in right ventricular wall thickness.  3. The pericardial effusion is circumferential.  4. Trivial pericardial effusion is present.  5. The mitral valve is grossly normal.  6. The tricuspid valve is grossly normal.  7. The aortic valve is tricuspid. No stenosis of the aortic valve.  8. The aorta is normal unless otherwise noted.  9. The aortic root is normal in size and structure. 10. No  cardiac source of embolism identified. 11. When compared to the prior study: No comparison.  LE Dopplers 08/02/2019 Right: Findings consistent with acute deep vein thrombosis involving the right peroneal veins.  Left: Findings consistent with acute deep vein thrombosis involving the left common femoral vein, left femoral vein, left proximal profunda vein, left popliteal vein, left posterior tibial veins, and left peroneal veins. Extending up into left iliac vein  and IVC.  07/21/2019 Right: Findings consistent with acute deep vein thrombosis involving the right peroneal veins. Left: Findings consistent with acute deep vein thrombosis involving the left popliteal vein, left posterior tibial veins, and left peroneal veins.  Vas [place] Transcranial Doppler W Bubbles 07/25/2019 No HITS heard heard at rest. No HITS heard heard during valsalva.   2D echo w/bubble 08/03/2019 Limited bubble study for shunt. No evidence for atrial level right to left shunt.  Ct Abdomen Pelvis W Contrast 08/04/2019 1. Large fluid collection in the lower right anterior abdominal wall subcutaneous fat with what appears to be an internal surgical drain, favored to represent a postoperative hematoma or proteinaceous seroma, however, the possibility of an abscess is not excluded in light of the patient's fever.  2. Small high attenuation fluid collection along the left pelvic sidewall which  likely represents a hematoma, as above.  3. Additional incidental findings, as above.  Ct Head Wo Contrast 08/14/2019 IMPRESSION:  1. Significant increase in the amount of acute hemorrhage along the periphery of patient's infarcted RIGHT hemisphere, most prominently developed adjacent to the posterior margin of the craniectomy site. There is associated increase in mass effect causing increased herniation through the craniectomy defect. The mass effect is also causing increased compression of the RIGHT lateral ventricle.  2. New  intraventricular extension of the acute hemorrhage.  3. No appreciable change of the mild leftward midline shift. No evidence of tonsillar herniation.   CT Head WO Contrast 08/15/19 IMPRESSION: 1. Postoperative changes from prior right craniectomy with extensive mass effect and herniation of the infarcted brain through the craniectomy defect, similar to previous. 2. Associated hemorrhagic transformation within the infarcted right cerebral hemisphere with dominant hematoma measuring up to 10.1 x 5.5 x 5.9 cm, slightly worsened from previous. Associated intraventricular extension and mass effect on the right lateral ventricle without worsening hydrocephalus or ventricular trapping, similar to previous. 3. No other new acute intracranial abnormality.  PHYSICAL EXAM     General- Well nourished, well developed, middle-aged African-American male. He has right hemicraniectomy surgical incision on the scalp.   Ophthalmologic- fundi not visualized due to noncooperation.  Cardiovascular - Regular rhythm and rate.  Neuro - patient is mildly lethargic but open eyes spontaneously and on voice. Nonverbal, but follow simple commands on the right today. Eyes in right gaze position, barely cross midline, not blinking to visual threat to the left, PERRL. Left facial droop. Tongue protrusion not corporative. Has spontaneous movement of RUE against gravity 4/5 at least and RLE in bed 2+/5 at least. On pain stimulation, mild withdraw of LLE, but no movement of LUE. DTR 1+ and no babinski. Sensation, coordination and gait not tested.  ASSESSMENT/PLAN Mr. JARRELL SFERRAZZA is a 43 y.o. male with no significant past medical history found down x 2 days nonverbal with L hemiparesis.   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 Trace L midline shift. ELVO at R ICA, R A1, R MCA.  MRI  Large R brain infarct sparing R PCA. Cytotoxic edema but no hemorrhage. Stable trace midline shift.  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 R ICA terminus, R ACA, R MCA occluded.   CT head similar w/ low density and sweddling R ACA and MCA territories, now with 5-68mm midline shift. no hemorrhage  CT head acute R ACA and MCA infarct w/ progressive swelling decompressed through craniectomy defect.   CTA head and neck stable MLS. R ICA occlusion in neck that continues into R ACA and MCA. Mild petechial hemorrhage at basal ganglia  CT Head 8/29 - Unchanged appearance of massive right MCA and ACA territory infarcts with parenchyma extending through decompressive craniectomy. Small focus of suspected hemorrhage adjacent to the right caudate head.  CT head 07/30/19 - New hematoma within the infarcted tissue   CT head 08/01/2019 stable w/o change  CT head 08/11/19 - new patchy hemorrhage along periphery of infarcted R frontoparietal lobe (likely HT) decreased dominant anterior margin hemorrhage. Stable herniation of infarcted brain through crani site.   CT head 9/20 - worsening with increased HT  on the right and new IVH  CT head 08/15/19 - slightly worsened HT from previous.   CT head 9/22 pending  2D Echo EF 60-65%  2D echo w/ bubble ordered no LRS   LE venous doppler DVT in R peroneal, L popliteal, L posterior tibial and L peroneal  LE venous doppler repeat DVT in R peroneal, L popliteal, L posterior tibial and L peroneal  TCD bubble no HITS, no PFO  LDL 83  HgbA1c 5.4  UDS positive THC  Hypercoagulable and autoimmune work up negative  SCDs for VTE prophylaxis  No antithrombotic prior to admission, treated DVT with heparin IV, then  discontinued due to hemorrhagic conversion with right frontal HT, then resumed heparin IV given extensive DVT and PE, but now heparin  IV again discontinued due to worsening HT  Therapy recommendations:  SNF (reassessed by CIR and declined 9/16)  Disposition:  pending   Cyctotoxic cerebral edema  S/p R decompressive hemicraniectomy (Nundkumar) w/ flap R abd 07/19/2019  PICC placed - keep for now per Leonie Man  Given One dose of 23.4% 8/26; 3%  off 8/30 1700  CT repeat 07/30/19 - New parenchymal hemorrhage within the infarcted tissue  Off helmet to further release pressure  CT repeat 08/01/2019 stable w/o change  CT head repeat 08/11/19 - new patchy hemorrhage along periphery of infarcted R frontoparietal lobe (likely HT) but previous right frontal hematoma now much reduced in size  CT head 9/20 worsening right HT with new ICH and IVH  CT Head 9/21 - slightly worsened HT from previous.   CT head 9/22 - pending  On NS to 40cc now  Monitor Na daily  ongoing neuro check since on heparin IV  Right ICA occlusion  MRA and CTA showed right ICA occlusion from origin to terminal  Wife denies any head trauma  Wife stated that pt had recent aggressive exercise with weight lifting  Concerning dissection as working diagnosis of stroke etiology   B LE DVT Small LLL PE  LE venous doppler DVT in R peroneal, L popliteal, L posterior tibial and L peroneal  Etiology unclear  Started IV heparin 07/26/2019  IVC filter placed 8/27. Plan retrieval in 8-12 weeks as an IP in an IR clinic  Hypercoagulable work up negative   Off SCDs  LE venous doppler 08/02/19 repeat extensive DVT in R peroneal, L popliteal, L posterior tibial and L peroneal up to the IVC filter  CT chest Nonocclusive thrombus seen within the left posterior lower lobe segmental artery.   hydrated w/ IVF @ 50cc, continue TF  Treated with IV Heparin but then off secondary to intracranial hemorrhage, then restarted IV heparin given extensive DVT and PE but again stopped due to worsening HT on 9/20  Hematology consult, appreciate help - hypercoag w/u neg.  At  risk for recurrent clots   Factor V Leiden, homocysteine, Prot C&S, Prothombin gene mutation  All neg.   Abdominal wall hematoma  Large fluid collection seen on CT abdomen  CT abd/pel concerning for abdominal wall fluid collection likely old hematoma  Dr. Kathyrn Sheriff did fluid aspiration and seems to be old blood component  Fluid aspiration sent for culture 9/11 - no growth < 24 hrs  Repeat ABD [place] complex seroma vs liquified hematoma R abd similar to previous  Seizure-like activity  EEG continuous slowing, excessive beta activity, sleep spindle asymmetry decreased R->related to stroke and sedation. No SZ  Long Term EEG cortical dysfunction in right frontal region  Seizure precautions  Continue Keppra   SIRS/fever/leukocytosis - ? central neurogenic fever vs. ? UTI  Tmax - 101.7->afebrile -> afebrile ->100.9->100.7->102.5  WBC - 10.7->10.0->9.1  CXR x 2 NAD  U/A x 3 - negative. 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 Secondary to stroke . Dysphagia 1 diet with thin liquid -> NPO due to lethargy with worsening HT  Cortrak w/ TF  Speech on  board   Other Stroke Risk Factors  Former Cigarette smoker, quit 3 yrs ago  ETOH use  Substance abuse UDS - positive for THC   Obesity, Body mass index is 30.32 kg/m., recommend weight loss, diet and exercise as appropriate   Other Active Problems  Normocytic anemia - hematology felt d/t ongoing blood draws 10.0->9.5->10.3  Hospital day # 27  This patient is critically ill due to lethargy, AMS, worsening HT on CT, fever and at significant risk of neurological worsening, death form hemorrhagic conversion, cerebral edema, brain herniation, status epilepticus, respiratory failure. This patient's care requires constant monitoring of vital signs, hemodynamics, respiratory and cardiac monitoring, review of multiple databases, neurological assessment, discussion with family, other specialists and medical decision making of high complexity. I spent 35 minutes of neurocritical care time in the care of this patient. I had long discussion with wife over the phone, updated pt current condition, treatment plan and potential prognosis.  She expressed understanding and appreciation.   Rosalin Hawking, MD PhD Stroke Neurology 08/15/2019 3:20 PM  To contact Stroke Continuity provider, please refer to [email or web address] After hours, contact General Neurology

## 2019-08-15 NOTE — Progress Notes (Signed)
Nutrition Follow-up  DOCUMENTATION CODES:   Obesity unspecified  INTERVENTION:   Resume continuous TF via Cortrak tube:   Osmolite 1.5 to 60 ml/hr Pro-Stat 30 mL to TID Provides 125 g of protein, 2460 kcals, 1094 mL of free water Meets 100% estimated calorie and protein needs  NUTRITION DIAGNOSIS:   Inadequate oral intake related to acute illness as evidenced by NPO status.  Ongoing.   GOAL:   Patient will meet greater than or equal to 90% of their needs  Progressing  MONITOR:   PO intake, Supplement acceptance, Diet advancement, TF tolerance, Weight trends, Labs, I & O's, Skin  REASON FOR ASSESSMENT:   Ventilator, Consult Enteral/tube feeding initiation and management  ASSESSMENT:   43 year old male who presented to the ED on 8/25 with AMS. No known PMH. CT head showed large R hemisphere infarct with edema and mild mass effect.  Pt discussed during ICU rounds and with RN.  Resume continuous TF as pt is now NPO after tx to ICU. Per neruo pt's CT showed significant enlarged hemorrhagic conversion on the R side, heparin stopped  8/25 -admit,emergent decompressive hemicraniectomy 8/27 - acute DVT b/l LE, IV placement 8/31 -extubated 9/01 - Cortrak placed, tube tip in stomach 9/15 - MBS with diet advanced to Dysphagia 1 with thin liquids 9/20 - pt transferred to 4N ICU as pt is more lethargic; follow up by SLP pt now NPO.   Medications and labs reviewed   Diet Order:   Diet Order            Diet NPO time specified  Diet effective now              EDUCATION NEEDS:   No education needs have been identified at this time  Skin:  Skin Assessment: Skin Integrity Issues: Skin Integrity Issues: Stage II: L face Incisions: abdomen, head  Last BM:  9/18 small  Height:   Ht Readings from Last 1 Encounters:  08/02/19 5\' 11"  (1.803 m)    Weight:   Wt Readings from Last 1 Encounters:  08/15/19 98.6 kg    Ideal Body Weight:  78.2 kg  BMI:  Body  mass index is 30.32 kg/m.  Estimated Nutritional Needs:   Kcal:  2300-2500 kcals  Protein:  117-135  Fluid:  >/= 2.1 L/day  Maylon Peppers RD, LDN, CNSC [phone] Pager [phone] After Hours Pager

## 2019-08-15 NOTE — Progress Notes (Signed)
Pharmacy Antibiotic Note  Matthew Black is a 43 y.o. male admitted on 07/19/2019 with UTI.  Pharmacy has been consulted for Zosyn dosing.  Plan: Zosyn 3.375 grams iv Q 8 hours Follow up Scr and LOT  Height: 5\' 11"  (180.3 cm) Weight: 217 lb 6 oz (98.6 kg) IBW/kg (Calculated) : 75.3  Temp (24hrs), Avg:99.7 F (37.6 C), Min:98.2 F (36.8 C), Max:100.9 F (38.3 C)  Recent Labs  Lab 08/09/19 0213 08/10/19 0618 08/12/19 0331 08/15/19 0607  WBC 10.0 10.7* 10.0 9.1  CREATININE 0.79  --  0.76 0.86    Estimated Creatinine Clearance: 135.3 mL/min (by C-G formula based on SCr of 0.86 mg/dL).    Allergies  Allergen Reactions  . Hydrocodone Nausea Only     Thank you for allowing pharmacy to be a part of this patient's care. Anette Guarneri, PharmD 08/15/2019 10:44 AM

## 2019-08-16 ENCOUNTER — Inpatient Hospital Stay (HOSPITAL_COMMUNITY): Payer: Medicaid Other

## 2019-08-16 LAB — CBC
HCT: 29.2 % — ABNORMAL LOW (ref 39.0–52.0)
Hemoglobin: 9.4 g/dL — ABNORMAL LOW (ref 13.0–17.0)
MCH: 28.4 pg (ref 26.0–34.0)
MCHC: 32.2 g/dL (ref 30.0–36.0)
MCV: 88.2 fL (ref 80.0–100.0)
Platelets: 186 10*3/uL (ref 150–400)
RBC: 3.31 MIL/uL — ABNORMAL LOW (ref 4.22–5.81)
RDW: 14.3 % (ref 11.5–15.5)
WBC: 10.9 10*3/uL — ABNORMAL HIGH (ref 4.0–10.5)
nRBC: 0 % (ref 0.0–0.2)

## 2019-08-16 LAB — BASIC METABOLIC PANEL
Anion gap: 9 (ref 5–15)
BUN: 17 mg/dL (ref 6–20)
CO2: 25 mmol/L (ref 22–32)
Calcium: 8.5 mg/dL — ABNORMAL LOW (ref 8.9–10.3)
Chloride: 107 mmol/L (ref 98–111)
Creatinine, Ser: 0.83 mg/dL (ref 0.61–1.24)
GFR calc Af Amer: >60 mL/min (ref >60)
GFR calc non Af Amer: >60 mL/min (ref >60)
Glucose, Bld: 113 mg/dL — ABNORMAL HIGH (ref 70–99)
Potassium: 3.6 mmol/L (ref 3.5–5.1)
Sodium: 141 mmol/L (ref 135–145)

## 2019-08-16 LAB — GLUCOSE, CAPILLARY
Glucose-Capillary: 104 mg/dL — ABNORMAL HIGH (ref 70–99)
Glucose-Capillary: 105 mg/dL — ABNORMAL HIGH (ref 70–99)
Glucose-Capillary: 108 mg/dL — ABNORMAL HIGH (ref 70–99)
Glucose-Capillary: 116 mg/dL — ABNORMAL HIGH (ref 70–99)
Glucose-Capillary: 142 mg/dL — ABNORMAL HIGH (ref 70–99)
Glucose-Capillary: 88 mg/dL (ref 70–99)

## 2019-08-16 MED ORDER — ONDANSETRON HCL 4 MG/2ML IJ SOLN: 4.0000 mg | Freq: Four times a day (QID) | INTRAMUSCULAR | Status: DC | PRN

## 2019-08-16 MED ORDER — BROMOCRIPTINE MESYLATE 2.5 MG PO TABS
5.0000 mg | ORAL_TABLET | Freq: Three times a day (TID) | ORAL | Status: DC
  Administered 2019-08-16 – 2019-08-18 (×6): 5 mg
  Filled 2019-08-16 (×7): qty 2

## 2019-08-16 MED ORDER — BUSPIRONE HCL 15 MG PO TABS
30.0000 mg | ORAL_TABLET | Freq: Two times a day (BID) | ORAL | Status: DC
  Administered 2019-08-16 – 2019-08-18 (×4): 30 mg
  Filled 2019-08-16 (×5): qty 2

## 2019-08-16 NOTE — Progress Notes (Signed)
  Speech Language Pathology Treatment: Dysphagia;Cognitive-Linquistic  Patient Details Name: Matthew Black MRN: [id number] DOB: [birth year] Today's Date: 08/16/2019 Time: 0900-0930 SLP Time Calculation (min) (ACUTE ONLY): 30 min  Assessment / Plan / Recommendation Clinical Impression  Pt seen at bedside for continued diagnostic treatment for po readiness. Pt alert this morning, cortrak in place. Pt has been NPO since 08/14/2019 due to hemorrhagic conversion. Oral care completed with suction, which pt tolerated well. [place]auer utilized to minimize bite and hold reflex for more efficient oral care. Pt tolerated trials of individual ice chips as well as sips of water via straw. No anterior leakage noted, and no overt s/s aspiration observed given one sip at a time. Sign placed at [facility] encouraging ice chips after oral care. This information was discussed with RN. SLP will continue to assess readiness to repeat MBS.   Pt continues to be nonvocal, and demonstrates inconsistent ability to follow one step verbal commands today. Accuracy increases with visual and tactile cues. Pt able to open eyes to voice, but has poor eye contact. Pt restless in bed, moving both legs off the bed to pt's left. Will continue cognitive linguistic treatment for improving communicative effectiveness.     HPI HPI: Pt is a 43 y.o. with no known PMH who presents on 8/25 after being found down at work, nonverbal with L hemiparesis. 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 Stable trace midline shift. S/p hemicraniectomy with abdominal flap implant. Intubated 8/26-8/31. CXR worsened atelectasis in the right lower lobe. MBS completed 08/09/2019 with recommendation for Dys1/thin liquids. Pt demonstrated increased lethargy and vomiting. Hemorrhagic conversion on CT, transferred back to ICU. Head CT 08/14/19 with increase in hemorrhage, edema, mass effect and compression of ventricle      SLP Plan  Continue with current plan of care       Recommendations  Diet recommendations: NPO;Other(comment)(ice chips after oral care for comfort) Liquids provided via: Teaspoon Medication Administration: Via alternative means Supervision: Full supervision/cueing for compensatory strategies Compensations: Minimize environmental distractions;Slow rate;Small sips/bites                Oral Care Recommendations: Oral care prior to ice chip/H20;Oral care QID Follow up Recommendations: 24 hour supervision/assistance SLP Visit Diagnosis: Dysphagia, unspecified (R13.10) Plan: Continue with current plan of care       [place]. Quentin Ore [facility], CCC-SLP Speech Language Pathologist [phone]  Shonna Chock 08/16/2019, 9:34 AM

## 2019-08-16 NOTE — Progress Notes (Signed)
STROKE TEAM PROGRESS NOTE   INTERVAL HISTORY Pt lying in bed, no neuro changes, following commands on the right. Nonverbal. Afebrile. Will taper bromocriptine and buspar. Continue Abx.    Vitals:   08/16/19 0800 08/16/19 0900 08/16/19 1000 08/16/19 1100  BP: 107/70 115/76 117/71 113/77  Pulse: 80 85 87 84  Resp: 19 14 17 19   Temp: 98.9 F (37.2 C)     TempSrc: Oral     SpO2: 96% 98% 98% 97%  Weight:      Height:        CBC:  Recent Labs  Lab 08/15/19 0607 08/16/19 0422  WBC 9.1 10.9*  NEUTROABS 6.7  --   HGB 10.3* 9.4*  HCT 31.1* 29.2*  MCV 84.3 88.2  PLT 220 [id number]    Basic Metabolic Panel:  Recent Labs  Lab 08/15/19 0607 08/16/19 0422  NA 138 141  K 3.8 3.6  CL 105 107  CO2 22 25  GLUCOSE 130* 113*  BUN 15 17  CREATININE 0.86 0.83  CALCIUM 8.4* 8.5*  MG 2.1  --   PHOS 3.5  --     IMAGING Ct Head Wo Contrast Ct Cervical Spine Wo Contrast  07/19/2019 1132 1. 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 2. No associated hemorrhage and mild intracranial mass effect at this time, including trace leftward midline shift. 3. Evidence of large vessel occlusion: Hyperdensity of the right ICA terminus, the right A1 and MCA. 4. Unaffected brain parenchyma appears negative. 5.  No acute traumatic injury identified in the cervical spine.   Mr Brain 48 Contrast Mr Angio Head Wo Contrast Mr Angio Neck Wo Contrast 07/19/2019 1330 1. 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 The right ICA terminus, right ACA and MCA also appear occluded. 2. 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 Cytotoxic edema but no convincing hemorrhage. Stable mild mass effect with trace leftward midline shift. 3. Intracranial MRA is degraded by motion artifact, but no other large vessel occlusion is suspected. There appears to be lobes reperfusion of the right PCA. 4. The left hemisphere and posterior fossa brain  parenchyma appears normal.   Ct Head Wo Contrast 07/19/2019 1532 Similar appearance to earlier. Low-density and swelling of the right hemisphere in the ACA and MCA territories with mass effect and right-to-left shift of 5-6 mm. No hemorrhagic transformation at this time.   Ct Head Wo Contrast 07/20/2019 0444 1. Acute right ACA and MCA territory infarct with progressive swelling that has decompressed through the craniectomy defect. 2. No acute hemorrhage or new infarction.   Ct Angio Head W Or Wo Contrast Ct Angio Neck W Or Wo Contrast 07/21/2019 1. Stable from prior MRA. 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 No evidence of atherosclerosis or vasculopathy in the other vessels. 2. Cytotoxic edema causes 5 mm of midline shift and brain bulging through the craniectomy defect. Mild petechial hemorrhage is seen at the basal ganglia.   Ct Head Wo Contrast 07/23/2019 1. Unchanged appearance of massive right MCA and ACA territory infarcts with parenchyma extending through decompressive craniectomy. 2. Small focus of suspected hemorrhage adjacent to the right caudate head.  07/30/2019 1. Decreasing mass effect within large right anterior MCA and ACA territory infarct. 2. Midline shift is no longer present. There is decreased effacement of the right lateral ventricle. 3. Infarcted brain tissue still herniates through the craniectomy  site. 4. New parenchymal hemorrhage within the infarcted tissue anteriorly measures 2.5 x 2.3 x 2.7 cm. 5. No new infarct.  08/01/2019  1. Stable head CT since 07/30/2019. 2. Again noted is a large infarct involving the right MCA and right ACA territories with brain tissue herniating through the right craniotomy defect. 3. Parenchymal hemorrhage in the right anterior cortex has minimally changed. Stable petechial hemorrhage in the right basal ganglia region.   08/11/2019 1. New patchy parenchymal hemorrhages along the  periphery of the infarcted RIGHT frontoparietal lobe, compatible with hemorrhagic conversion, largest focus underlying the RIGHT frontal bone measures 1.3 cm greatest dimension. 2. Previously described dominant focus of hemorrhage along the anterior margin of the craniectomy site has decreased in size and density compared to previous exams, compatible with expected evolution. 3. Stable herniation of infarcted brain through the RIGHT frontal-parietal-temporal craniectomy site. 4. No additional mass effect or midline shift on today's exam.   Ct Angio Chest Pe W Or Wo Contrast 08/01/2019 Nonocclusive thrombus seen within the left posterior lower lobe segmental artery.    [place] ABD Limited 08/11/2019 Suspected mildly complex seroma versus liquified hematoma in the superficial soft tissues of the RIGHT abdomen, measuring 7.9 cardiomyopathy greatest dimension, surrounding patient's calvarium (location of patient's skull status post partial craniectomy), appearance less suggestive of abscess. The collection seen today by ultrasound is similar to the appearance of the collection on earlier CT abdomen of 08/04/2019.  2D Echocardiogram 07/20/2019  1. The left ventricle has normal systolic function with an ejection fraction of 60-65%. The cavity size was normal. Left ventricular diastolic parameters were normal.  2. The right ventricle has normal systolic function. The cavity was normal. There is no increase in right ventricular wall thickness.  3. The pericardial effusion is circumferential.  4. Trivial pericardial effusion is present.  5. The mitral valve is grossly normal.  6. The tricuspid valve is grossly normal.  7. The aortic valve is tricuspid. No stenosis of the aortic valve.  8. The aorta is normal unless otherwise noted.  9. The aortic root is normal in size and structure. 10. No cardiac source of embolism identified. 11. When compared to the prior study: No comparison.  LE  Dopplers 08/02/2019 Right: Findings consistent with acute deep vein thrombosis involving the right peroneal veins.  Left: Findings consistent with acute deep vein thrombosis involving the left common femoral vein, left femoral vein, left proximal profunda vein, left popliteal vein, left posterior tibial veins, and left peroneal veins. Extending up into left iliac vein  and IVC. 07/21/2019 Right: Findings consistent with acute deep vein thrombosis involving the right peroneal veins. Left: Findings consistent with acute deep vein thrombosis involving the left popliteal vein, left posterior tibial veins, and left peroneal veins.  Vas [place] Transcranial Doppler W Bubbles 07/25/2019 No HITS heard heard at rest. No HITS heard heard during valsalva.   2D echo w/bubble 08/03/2019 Limited bubble study for shunt. No evidence for atrial level right to left shunt.  Ct Abdomen Pelvis W Contrast 08/04/2019 1. Large fluid collection in the lower right anterior abdominal wall subcutaneous fat with what appears to be an internal surgical drain, favored to represent a postoperative hematoma or proteinaceous seroma, however, the possibility of an abscess is not excluded in light of the patient's fever.  2. Small high attenuation fluid collection along the left pelvic sidewall which likely represents a hematoma, as above.  3. Additional incidental findings, as above.  Ct Head Wo Contrast 08/14/2019 IMPRESSION:  1.  Significant increase in the amount of acute hemorrhage along the periphery of patient's infarcted RIGHT hemisphere, most prominently developed adjacent to the posterior margin of the craniectomy site. There is associated increase in mass effect causing increased herniation through the craniectomy defect. The mass effect is also causing increased compression of the RIGHT lateral ventricle.  2. New intraventricular extension of the acute hemorrhage.  3. No appreciable change of the mild leftward midline shift.  No evidence of tonsillar herniation.   CT Head WO Contrast 08/15/19 IMPRESSION: 1. Postoperative changes from prior right craniectomy with extensive mass effect and herniation of the infarcted brain through the craniectomy defect, similar to previous. 2. Associated hemorrhagic transformation within the infarcted right cerebral hemisphere with dominant hematoma measuring up to 10.1 x 5.5 x 5.9 cm, slightly worsened from previous. Associated intraventricular extension and mass effect on the right lateral ventricle without worsening hydrocephalus or ventricular trapping, similar to previous. 3. No other new acute intracranial abnormality.  Ct Head Wo Contrast  08/16/2019 1. Hemorrhagic right ACA and MCA territory infarcts with swollen brain bulging through the craniectomy defect. Intraventricular hemorrhage with ventriculomegaly. No change from 2 days ago.    PHYSICAL EXAM   General- Well nourished, well developed, middle-aged African-American male. He has right hemicraniectomy surgical incision on the scalp.   Ophthalmologic- fundi not visualized due to noncooperation.  Cardiovascular - Regular rhythm and rate.  Neuro - patient is mildly lethargic but open eyes spontaneously and on voice. Nonverbal, but follow simple commands on the right today. Eyes in right gaze position, barely cross midline, not blinking to visual threat to the left, PERRL. Left facial droop. Tongue protrusion not corporative. Has spontaneous movement of RUE against gravity 4/5 at least and RLE in bed 2+/5 at least. On pain stimulation, mild withdraw of LLE, but no movement of LUE. DTR 1+ and no babinski. Sensation, coordination and gait not tested.  ASSESSMENT/PLAN Matthew Black is a 43 y.o. male with no significant past medical history found down x 2 days nonverbal with L hemiparesis.   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 Trace L midline shift. ELVO at R ICA, R A1, R MCA.  MRI  Large R brain infarct sparing R PCA. Cytotoxic edema but no hemorrhage. Stable trace midline shift.  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 R ICA terminus, R ACA, R MCA occluded.   CT head similar w/ low density and sweddling R ACA and MCA territories, now with 5-53mm midline shift. no hemorrhage  CT head acute R ACA and MCA infarct w/ progressive swelling decompressed through craniectomy defect.   CTA head and neck stable MLS. R ICA occlusion in neck that continues into R ACA and MCA. Mild petechial hemorrhage at basal ganglia  CT Head 8/29 - Unchanged appearance of massive right MCA and ACA territory infarcts with parenchyma extending through decompressive craniectomy. Small focus of suspected hemorrhage adjacent to the right caudate head.  CT head 07/30/19 - New hematoma within the infarcted tissue   CT head 08/01/2019 stable w/o change  CT head 08/11/19 - new patchy hemorrhage along periphery of infarcted R frontoparietal lobe (likely HT) decreased dominant anterior margin hemorrhage. Stable herniation of infarcted brain through crani  site.   CT head 9/20 - worsening with increased HT on the right and new IVH  CT head 08/15/19 - slightly worsened HT from previous.   CT head 9/22 hemorrhagic R ACA and MCA territory infarcts with edematous brain bulging through Crani defect.  No change x2 days  2D Echo EF 60-65%  2D echo w/ bubble ordered no LRS   LE venous doppler DVT in R peroneal, L popliteal, L posterior tibial and L peroneal  LE venous doppler repeat DVT in R peroneal, L popliteal, L posterior tibial and L peroneal  TCD bubble no HITS, no PFO  LDL 83  HgbA1c 5.4  UDS positive THC  Hypercoagulable and autoimmune work up negative  SCDs for VTE  prophylaxis  No antithrombotic prior to admission, treated DVT with heparin IV, then  discontinued due to hemorrhagic conversion with right frontal HT, then resumed heparin IV given extensive DVT and PE, but now heparin IV again discontinued due to worsening HT  Therapy recommendations:  SNF (reassessed by CIR and declined 9/16)  Disposition:  pending   Cyctotoxic cerebral edema  S/p R decompressive hemicraniectomy (Nundkumar) w/ flap R abd 07/19/2019  PICC placed - keep for now per Leonie Man  Given One dose of 23.4% 8/26; 3%  off 8/30 1700  CT repeat 07/30/19 - New parenchymal hemorrhage within the infarcted tissue  Off helmet to further release pressure  CT repeat 08/01/2019 stable w/o change  CT head repeat 08/11/19 - new patchy hemorrhage along periphery of infarcted R frontoparietal lobe (likely HT) but previous right frontal hematoma now much reduced in size  CT head 9/20 worsening right HT with new ICH and IVH  CT Head 9/21 - slightly worsened HT from previous.   CT head 9/22 hemorrhagic R ACA and MCA territory infarcts with edematous brain bulging through Crani defect.  No change x2 days  Monitor Na daily  ongoing neuro check since on heparin IV  Right ICA occlusion  MRA and CTA showed right ICA occlusion from origin to terminal  Wife denies any head trauma  Wife stated that pt had recent aggressive exercise with weight lifting  Concerning dissection as working diagnosis of stroke etiology   B LE DVT Small LLL PE  LE venous doppler DVT in R peroneal, L popliteal, L posterior tibial and L peroneal  Etiology unclear  Started IV heparin 07/26/2019  IVC filter placed 8/27. Plan retrieval in 8-12 weeks as an IP in an IR clinic  Hypercoagulable work up negative   Off SCDs  LE venous doppler 08/02/19 repeat extensive DVT in R peroneal, L popliteal, L posterior tibial and L peroneal up to the IVC filter  CT chest Nonocclusive thrombus seen within the left posterior  lower lobe segmental artery.   Treated with IV Heparin but then off secondary to intracranial hemorrhage, then restarted IV heparin given extensive DVT and PE but again stopped due to worsening HT on 9/20  Hematology consult, appreciate help - hypercoag w/u neg.  At risk for recurrent clots   Factor V Leiden, homocysteine, Prot C&S, Prothombin gene mutation all neg.   Abdominal wall hematoma  Large fluid collection seen on CT abdomen  CT abd/pel concerning for abdominal wall fluid collection likely old hematoma  Dr. Kathyrn Sheriff did fluid aspiration and seems to be old blood component  Fluid aspiration sent for culture 9/11 - no growth < 24 hrs  Repeat ABD [place] complex seroma vs liquified hematoma R abd similar to previous  Seizure-like activity  EEG continuous slowing, excessive beta activity, sleep spindle asymmetry decreased R->related to stroke and sedation. No SZ  Long Term EEG cortical dysfunction in right frontal region  Seizure precautions  Continue Keppra   SIRS/fever/leukocytosis - ? central neurogenic fever vs. ? UTI  Tmax - 101.7->afebrile -> afebrile ->100.9->100.7->102.5    WBC - 10.7->10.0->9.1  CXR x 2 NAD  U/A x 3 - negative. UCx neg   Blood cultures - neg, repeated again 08/04/19 -> no growth on empiric unasyn -> changed to vanco, cefepime and flagyl 08/05/19->now off  Lactic acid 1.2->2.0  Hepatitis panel 9/12 - neg  On baclofen 10 mg tid and decrease Bromocriptine to 5 mg q8.  buspar 30mg  decreased to bid for central neurogenic fever    Urine culture pseudomonas - sensitive to zosyn  UA 9/21 - WBC 0-5 negative   On zosyn now x 5d (end date set in order)   Tachycardia, resolved  HR normal now  Likely due to fever  Hydration with IVF, TF and free water  On metoprolol to 50 Q8h  EKG ST, rate 120 w/ possible LA enlargement  Troponin series neg x 3    Hyperlipidemia  Home meds:  no statin  LDL 83, goal < 70  Was on lipitor 20 mg daily    D/c statin due to elevated LFT  Elevated LFT, improving  AST - 105-118-98-85 - pending  ALT - [phone] - pending  CK [id number] -[id number]  Hepatitis panel 9/12 - neg  Dysphagia . Secondary to stroke . Dysphagia 1 diet with thin liquid -> NPO due to lethargy with worsening HT  Cortrak w/ TF  Speech on board   Other Stroke Risk Factors  Former Cigarette smoker, quit 3 yrs ago  ETOH use  Substance abuse UDS - positive for THC   Obesity, Body mass index is 29.55 kg/m., recommend weight loss, diet and exercise as appropriate   Other Active Problems  Normocytic anemia - hematology felt d/t ongoing blood draws 10.0->9.5->10.3  Hospital day # 28  This patient is critically ill due to lethargy, AMS, worsening HT on CT, fever and at significant risk of neurological worsening, death form hemorrhagic conversion, cerebral edema, brain herniation, status epilepticus, respiratory failure. This patient's care requires constant monitoring of vital signs, hemodynamics, respiratory and cardiac monitoring, review of multiple databases, neurological assessment, discussion with family, other specialists and medical decision making of high complexity. I spent 30 minutes of neurocritical care time in the care of this patient.   Rosalin Hawking, MD PhD Stroke Neurology 08/16/2019 11:24 AM  To contact Stroke Continuity provider, please refer to [email or web address] After hours, contact General Neurology

## 2019-08-16 NOTE — Progress Notes (Signed)
NAME:  Matthew Black, MRN:  [id number], DOB:  04-02-1976, LOS: 48 ADMISSION DATE:  07/19/2019, CONSULTATION DATE:  8/25 REFERRING MD:  EDP, CHIEF COMPLAINT:  Stroke   Brief History   43yo male with no known PMH presented 8/25 after being found down at work at [employer] by a coworker covered in vomit and urine.  Last seen normal by his wife 830am 8/24 before he left for work.  CT head showed large R hemisphere infarct with edema and mild mass effect. He was admitted by neurology, placed on 3% saline and PCCM asked to consult for ICU assistance at the beginning of pt's hospitalization   On 08/14/19  Pt became more lethargic with vomiting, CT confirmed hemorrhagic conversion and PCCM was re-consulted given risk of airway compromise with worsening neurologic exam  Past Medical History  None.  Significant Hospital Events   8/25 > admit. To OR for decompressive crani; returned on vent  8/26: Sedated, had possible seizure activity earlier today involving rhythmic movement of the right hand/wrist and right leg currently on propofol infusion 8/27: following commands on R and some withdraw to pain on LLE. Not opening eyes. Remains on some propofol. Will add fentanyl prn. IVC filter placed late in day 8/27 9/20 Pt became more lethargic and vomiting, hemorrhagic conversion on CT, transferred back to ICU  Consults:  PCCM.  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 The right ICA terminus, right ACA and MCA also appear occluded.  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 Cytotoxic edema but no convincing hemorrhage. Stable mild mass effect with trace leftward midline shift.  CT head 8/26:  cerebral edema decompressing thru the craniotomy  Echo 8/25 > normal LVEF 65% CTA head neck 8/27: 1. Stable from prior MRA. There is right ICA occlusion in the neck that continues into  the right ACA and MCA vessels. No evidence of atherosclerosis or vasculopathy in the other vessels. 2. Cytotoxic edema causes 5 mm of midline shift and brain bulging through the craniectomy defect. Mild petechial hemorrhage is seen at the basal ganglia. BLE DVT vasc US 8/28: acute DVT R peroneal vein, L popliteal vein, L posterior tibial vein, L peroneal veins  Head CT 08/14/19 with increase in hemorrhage, edema, mass effect and compression of ventricle   Micro Data:  COVID 8/25 > negative BCx 8/25> neg 9/19 UC>>Pseudomonas>>intermediate sensitivity to Ceftaz and Cefepime 9/12 BCx2>>negative  9/11 Abdominal fluid>>neg  Antimicrobials:  Unasyn 9/6-9/10 Cefepime 9/10-9/13 Flagyl 9/10-9/12 Vancomycin 9/10-9/13 Zosyn 9/21-  Interim history/subjective:  Fever curve improving, awake and following some commands this morning   Objective   Blood pressure 107/70, pulse 80, temperature 98.9 F (37.2 C), temperature source Oral, resp. rate 19, height 5\' 11"  (1.803 m), weight 96.1 kg, SpO2 96 %.        Intake/Output Summary (Last 24 hours) at 08/16/2019 0910 Last data filed at 08/16/2019 0800 Gross per 24 hour  Intake 3964.97 ml  Output 2525 ml  Net 1439.97 ml   Filed Weights   08/14/19 0344 08/15/19 0500 08/16/19 0402  Weight: 91.8 kg 98.6 kg 96.1 kg   General:  Awake, non-toxic appearing, no signs of respiratory distress on room air HEENT: MM pink/moist, stapled surgical site of the R scalp with significant edema Neuro: opening eyes and tracking, moving RLE to command, withdrawing to pain in the BUE CV: s1s2, RRR no  m/r/g PULM:  CTAB, sitting up and no hypoxia on RA, no excess secretions GI: soft, bsx4 active, bone flap RLQ  Extremities: warm/dry, no edema edema  Skin: no rashes or lesions   Assessment & Plan:   Concern for respiratory failure following 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 and cerebral edema, s/p hemicraniectomy  and Abd/ flap implant -Worsening neurologic exam since 9/20 and transferred to the ICU for possible airway protection  -Neurology following  -Completed hypertonic saline -heparin was started for PE/DVT, now stopped P: -Continues to protect his airway and is more interactive and awake this morning, no supplemental oxygen requirements -Repeat CT head today shows continued intraventricular hemorrhage and brain bulging through craniectomy defect, unchanged -At this time, pt's neuro and respiratory status are stable, PCCM will sign off, but please re-engage if his clinical status were to worsen     Possible Seizures -Seizure activity on the floor and was started on Keppra -EEG 8/27 without seizure activity P: -Continue Keppra    Persistent Fevers, pseudomonal UTI -fever curve improving -Initial Concern for aspiration, started on Unasyn -Urine culture resulted with pseudomonas intermediate sensitivity to Cefepime and Ceftaz P: -Continue Zosyn, Bromocriptine and Tylenol   Non-occlusive RLL PE and bilateral LE DVT -heparin held 2/2 hemorrhagic conversion -IVC filter placed 8/27     Results for orders placed or performed during the hospital encounter of 07/19/19 (from the past 24 hour(s))  Glucose, capillary     Status: Abnormal   Collection Time: 08/15/19 11:42 AM  Result Value Ref Range   Glucose-Capillary 111 (H) 70 - 99 mg/dL  Glucose, capillary     Status: None   Collection Time: 08/15/19  3:23 PM  Result Value Ref Range   Glucose-Capillary 98 70 - 99 mg/dL  Glucose, capillary     Status: Abnormal   Collection Time: 08/15/19  8:24 PM  Result Value Ref Range   Glucose-Capillary 112 (H) 70 - 99 mg/dL  Glucose, capillary     Status: Abnormal   Collection Time: 08/16/19 12:01 AM  Result Value Ref Range   Glucose-Capillary 142 (H) 70 - 99 mg/dL  Glucose, capillary     Status: None   Collection Time: 08/16/19  3:48 AM  Result Value Ref Range   Glucose-Capillary 88 70 -  99 mg/dL  CBC     Status: Abnormal   Collection Time: 08/16/19  4:22 AM  Result Value Ref Range   WBC 10.9 (H) 4.0 - 10.5 K/uL   RBC 3.31 (L) 4.22 - 5.81 MIL/uL   Hemoglobin 9.4 (L) 13.0 - 17.0 g/dL   HCT 29.2 (L) 39.0 - 52.0 %   MCV 88.2 80.0 - 100.0 fL   MCH 28.4 26.0 - 34.0 pg   MCHC 32.2 30.0 - 36.0 g/dL   RDW 14.3 11.5 - 15.5 %   Platelets 186 150 - 400 K/uL   nRBC 0.0 0.0 - 0.2 %  Basic metabolic panel     Status: Abnormal   Collection Time: 08/16/19  4:22 AM  Result Value Ref Range   Sodium 141 135 - 145 mmol/L   Potassium 3.6 3.5 - 5.1 mmol/L   Chloride 107 98 - 111 mmol/L   CO2 25 22 - 32 mmol/L   Glucose, Bld 113 (H) 70 - 99 mg/dL   BUN 17 6 - 20 mg/dL   Creatinine, Ser 0.83 0.61 - 1.24 mg/dL   Calcium 8.5 (L) 8.9 - 10.3 mg/dL  GFR calc non Af Amer >60 >60 mL/min   GFR calc Af Amer >60 >60 mL/min   Anion gap 9 5 - 15  Glucose, capillary     Status: Abnormal   Collection Time: 08/16/19  8:52 AM  Result Value Ref Range   Glucose-Capillary 104 (H) 70 - 99 mg/dL      Otilio Carpen Keiry Kowal, PA-C [facility]  Pager# [phone], if no answer [phone]

## 2019-08-17 ENCOUNTER — Inpatient Hospital Stay (HOSPITAL_COMMUNITY): Payer: Medicaid Other

## 2019-08-17 LAB — GLUCOSE, CAPILLARY
Glucose-Capillary: 104 mg/dL — ABNORMAL HIGH (ref 70–99)
Glucose-Capillary: 106 mg/dL — ABNORMAL HIGH (ref 70–99)
Glucose-Capillary: 106 mg/dL — ABNORMAL HIGH (ref 70–99)
Glucose-Capillary: 110 mg/dL — ABNORMAL HIGH (ref 70–99)
Glucose-Capillary: 117 mg/dL — ABNORMAL HIGH (ref 70–99)
Glucose-Capillary: 120 mg/dL — ABNORMAL HIGH (ref 70–99)
Glucose-Capillary: 128 mg/dL — ABNORMAL HIGH (ref 70–99)

## 2019-08-17 LAB — URINALYSIS, COMPLETE (UACMP) WITH MICROSCOPIC
Bilirubin Urine: NEGATIVE
Glucose, UA: NEGATIVE mg/dL
Ketones, ur: NEGATIVE mg/dL
Leukocytes,Ua: NEGATIVE
Nitrite: NEGATIVE
Protein, ur: NEGATIVE mg/dL
Specific Gravity, Urine: 1.017 (ref 1.005–1.030)
pH: 7 (ref 5.0–8.0)

## 2019-08-17 LAB — BASIC METABOLIC PANEL
Anion gap: 9 (ref 5–15)
BUN: 14 mg/dL (ref 6–20)
CO2: 25 mmol/L (ref 22–32)
Calcium: 8.6 mg/dL — ABNORMAL LOW (ref 8.9–10.3)
Chloride: 105 mmol/L (ref 98–111)
Creatinine, Ser: 0.9 mg/dL (ref 0.61–1.24)
GFR calc Af Amer: >60 mL/min (ref >60)
GFR calc non Af Amer: >60 mL/min (ref >60)
Glucose, Bld: 113 mg/dL — ABNORMAL HIGH (ref 70–99)
Potassium: 3.5 mmol/L (ref 3.5–5.1)
Sodium: 139 mmol/L (ref 135–145)

## 2019-08-17 LAB — CBC
HCT: 32.6 % — ABNORMAL LOW (ref 39.0–52.0)
Hemoglobin: 10.3 g/dL — ABNORMAL LOW (ref 13.0–17.0)
MCH: 27.6 pg (ref 26.0–34.0)
MCHC: 31.6 g/dL (ref 30.0–36.0)
MCV: 87.4 fL (ref 80.0–100.0)
Platelets: 207 10*3/uL (ref 150–400)
RBC: 3.73 MIL/uL — ABNORMAL LOW (ref 4.22–5.81)
RDW: 14.1 % (ref 11.5–15.5)
WBC: 17.1 10*3/uL — ABNORMAL HIGH (ref 4.0–10.5)
nRBC: 0 % (ref 0.0–0.2)

## 2019-08-17 LAB — HEPATIC FUNCTION PANEL
ALT: 64 U/L — ABNORMAL HIGH (ref 0–44)
AST: 38 U/L (ref 15–41)
Albumin: 2.9 g/dL — ABNORMAL LOW (ref 3.5–5.0)
Alkaline Phosphatase: 98 U/L (ref 38–126)
Bilirubin, Direct: 0.2 mg/dL (ref 0.0–0.2)
Indirect Bilirubin: 0.2 mg/dL — ABNORMAL LOW (ref 0.3–0.9)
Total Bilirubin: 0.4 mg/dL (ref 0.3–1.2)
Total Protein: 7.3 g/dL (ref 6.5–8.1)

## 2019-08-17 MED ORDER — ACETAMINOPHEN 650 MG RE SUPP: 650.0000 mg | RECTAL | Status: DC | PRN

## 2019-08-17 MED ORDER — ACETAMINOPHEN 160 MG/5ML PO SOLN
650.0000 mg | ORAL | Status: DC | PRN
  Administered 2019-08-17 – 2019-08-19 (×8): 650 mg
  Filled 2019-08-17 (×8): qty 20.3

## 2019-08-17 MED ORDER — ACETAMINOPHEN 325 MG PO TABS
650.0000 mg | ORAL_TABLET | ORAL | Status: DC | PRN
  Filled 2019-08-17: qty 2

## 2019-08-17 NOTE — Progress Notes (Addendum)
Occupational Therapy Treatment Patient Details Name: Matthew Black MRN: [id number] DOB: 12/13/1975 Today's Date: 08/17/2019    History of present illness Pt is a 43 y.o. M with no known PMH who presents on 8/25 after being found down at work, nonverbal with L hemiparesis. CT head showing large R hemisphere infarct with edema and mild mass effect. 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 Stable trace midline shift. S/p hemicraniectomy for  edema with abdominal flap implant. BLE DVT vasc US 8/28: acute DVT bilateral lower extremities, s/p IVC placement.  On 9/21 Repeat CT stable large hemorrhagic conversion.   OT comments  Pt continues to present with decreased balance, activity tolerance, attention of left side, functional use of LUE, and occupational performance and participation. Pt requiring Total A +2 for bed mobility and Max-Total A for sitting at EOB. Pt requiring Total A got hand over hand for grooming while sitting at EOB. Facilitating PROM for BUEs and ROM of neck. Continue to recommend dc to SNF and will continue to follow acutely as admitted. Goals remain appropriate and updated goal date.   Follow Up Recommendations  SNF;Supervision/Assistance - 24 hour    Equipment Recommendations  Other (comment)(defer)    Recommendations for Other Services (pending process but start following)    Precautions / Restrictions Precautions Precautions: Fall Precaution Comments: L hemiparesis, R skull missing, abd bone flap Required Braces or Orthoses: Other Brace Other Brace: helmet  Restrictions Weight Bearing Restrictions: No       Mobility Bed Mobility Overal bed mobility: Needs Assistance Bed Mobility: Supine to Sit;Sit to Supine     Supine to sit: Total assist;HOB elevated;+2 for physical assistance Sit to supine: +2 for physical assistance;Total assist;HOB elevated   General bed mobility comments: Total A +2 with use of helicopter  method to bring BLEs over EOB and then elevate trunk.  Transfers                      Balance Overall balance assessment: Needs assistance Sitting-balance support: Feet supported;Bilateral upper extremity supported Sitting balance-Leahy Scale: Zero Sitting balance - Comments: Max +2 to bring trunk forward from back of chair Postural control: Posterior lean;Left lateral lean                                 ADL either performed or assessed with clinical judgement   ADL Overall ADL's : Needs assistance/impaired     Grooming: Wash/dry face;Sitting;Maximal assistance Grooming Details (indicate cue type and reason): Max A for sitting balance and holding up head, HOHA to bring wash cloth to face, pt using hand.                                General ADL Comments: Pt performing grooming and exercises at EOB with Max A for sitting balance.     Vision   Additional Comments: Right gaze and head turn   Perception     Praxis      Cognition Arousal/Alertness: Lethargic Behavior During Therapy: Flat affect Overall Cognitive Status: Difficult to assess Area of Impairment: Attention;Following commands;Safety/judgement;Awareness;Problem solving                   Current Attention Level: Focused   Following Commands: Follows one step commands inconsistently;Follows one step commands with increased time Safety/Judgement: Decreased  awareness of safety;Decreased awareness of deficits Awareness: Intellectual Problem Solving: Slow processing;Decreased initiation;Difficulty sequencing;Requires verbal cues;Requires tactile cues General Comments: Pt very figity and restless during session. Grimacing with head movement to look left. Pt making eye contact with his wife while sitting upright (~4 times). Difficulty following commands        Exercises Exercises: Other exercises Other Exercises Other Exercises: PROM for forward flexion to bring BUEs upward  and then havign pt hold his left wrist with right hand  Other Exercises: cervical AROM away from R rotation and stretch into L rotation/lateral flexion as able with positioning end of session with prop under R side for more neutral alignment   Shoulder Instructions       General Comments VSS throughout. Wife present    Pertinent Vitals/ Pain       Pain Assessment: Faces Faces Pain Scale: Hurts a little bit Pain Location: with ROM of head/neck Pain Descriptors / Indicators: Grimacing Pain Intervention(s): Monitored during session;Limited activity within patient's tolerance;Repositioned  Home Living                                          Prior Functioning/Environment              Frequency  Min 2X/week        Progress Toward Goals  OT Goals(current goals can now be found in the care plan section)  Progress towards OT goals: Progressing toward goals  Acute Rehab OT Goals Patient Stated Goal: unable OT Goal Formulation: Patient unable to participate in goal setting Time For Goal Achievement: 08/07/19 Potential to Achieve Goals: Fair ADL Goals Additional ADL Goal #1: pt will demonstrate sustain attention to adl task Additional ADL Goal #2: pt will sit Eob with max (A) as precursor to adls. Additional ADL Goal #3: pt will follow 2 step command  Plan Frequency remains appropriate;Discharge plan remains appropriate    Co-evaluation                 AM-PAC OT "6 Clicks" Daily Activity     Outcome Measure   Help from another person eating meals?: Total(NG) Help from another person taking care of personal grooming?: Total Help from another person toileting, which includes using toliet, bedpan, or urinal?: Total Help from another person bathing (including washing, rinsing, drying)?: Total Help from another person to put on and taking off regular upper body clothing?: Total Help from another person to put on and taking off regular lower body  clothing?: Total 6 Click Score: 6    End of Session    OT Visit Diagnosis: Unsteadiness on feet (R26.81);Muscle weakness (generalized) (M62.81);Hemiplegia and hemiparesis Hemiplegia - Right/Left: Left Hemiplegia - dominant/non-dominant: Non-Dominant Hemiplegia - caused by: Nontraumatic intracerebral hemorrhage   Activity Tolerance Patient tolerated treatment well   Patient Left in bed;with call bell/phone within reach;with family/visitor present   Nurse Communication Mobility status(Possible BM)        [id number] OT Time Calculation (min): 28 min  Charges: OT General Charges $OT Visit: 1 Visit OT Treatments $Self Care/Home Management : 8-22 mins $Neuromuscular Re-education: 8-22 mins  Kayn Haymore MSOT, OTR/L Acute Rehab Pager: [phone] Office: [place] 08/17/2019, 5:16 PM

## 2019-08-17 NOTE — Progress Notes (Signed)
STROKE TEAM PROGRESS NOTE   INTERVAL HISTORY Pt lying in bed, no neuro changes, still following command on the right. Nonverbal. Still has fever and Tmax 101.1. WBC elevated to 17.1. will repeat blood culture and UA. CXR unremarkable.   Vitals:   08/17/19 0500 08/17/19 0520 08/17/19 0600 08/17/19 0700  BP: (!) 139/96 (!) 139/96 129/84 (!) 141/68  Pulse:  (!) 111 93 92  Resp: 17  16 13   Temp:   100.3 F (37.9 C)   TempSrc:   Axillary   SpO2:   99% 100%  Weight: 96.1 kg     Height:        CBC:  Recent Labs  Lab 08/15/19 0607 08/16/19 0422 08/17/19 0500  WBC 9.1 10.9* 17.1*  NEUTROABS 6.7  --   --   HGB 10.3* 9.4* 10.3*  HCT 31.1* 29.2* 32.6*  MCV 84.3 88.2 87.4  PLT 220 186 [id number]    Basic Metabolic Panel:  Recent Labs  Lab 08/15/19 0607 08/16/19 0422 08/17/19 0500  NA 138 141 139  K 3.8 3.6 3.5  CL 105 107 105  CO2 22 25 25   GLUCOSE 130* 113* 113*  BUN 15 17 14   CREATININE 0.86 0.83 0.90  CALCIUM 8.4* 8.5* 8.6*  MG 2.1  --   --   PHOS 3.5  --   --     IMAGING Ct Head Wo Contrast Ct Cervical Spine Wo Contrast  07/19/2019 1132 1. 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 2. No associated hemorrhage and mild intracranial mass effect at this time, including trace leftward midline shift. 3. Evidence of large vessel occlusion: Hyperdensity of the right ICA terminus, the right A1 and MCA. 4. Unaffected brain parenchyma appears negative. 5.  No acute traumatic injury identified in the cervical spine.   Mr Brain 56 Contrast Mr Angio Head Wo Contrast Mr Angio Neck Wo Contrast 07/19/2019 1330 1. 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 The right ICA terminus, right ACA and MCA also appear occluded. 2. 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 Cytotoxic edema but no convincing hemorrhage. Stable mild mass effect with trace leftward midline shift. 3. Intracranial MRA is degraded by motion  artifact, but no other large vessel occlusion is suspected. There appears to be lobes reperfusion of the right PCA. 4. The left hemisphere and posterior fossa brain parenchyma appears normal.   Ct Head Wo Contrast 07/19/2019 1532 Similar appearance to earlier. Low-density and swelling of the right hemisphere in the ACA and MCA territories with mass effect and right-to-left shift of 5-6 mm. No hemorrhagic transformation at this time.   Ct Head Wo Contrast 07/20/2019 0444 1. Acute right ACA and MCA territory infarct with progressive swelling that has decompressed through the craniectomy defect. 2. No acute hemorrhage or new infarction.   Ct Angio Head W Or Wo Contrast Ct Angio Neck W Or Wo Contrast 07/21/2019 1. Stable from prior MRA. 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 No evidence of atherosclerosis or vasculopathy in the other vessels. 2. Cytotoxic edema causes 5 mm of midline shift and brain bulging through the craniectomy defect. Mild petechial hemorrhage is seen at the basal ganglia.   Ct Head Wo Contrast 07/23/2019 1. Unchanged appearance of massive right MCA and ACA territory infarcts with parenchyma extending through decompressive craniectomy. 2. Small focus of suspected hemorrhage adjacent to the right caudate  head.  07/30/2019 1. Decreasing mass effect within large right anterior MCA and ACA territory infarct. 2. Midline shift is no longer present. There is decreased effacement of the right lateral ventricle. 3. Infarcted brain tissue still herniates through the craniectomy site. 4. New parenchymal hemorrhage within the infarcted tissue anteriorly measures 2.5 x 2.3 x 2.7 cm. 5. No new infarct.  08/01/2019  1. Stable head CT since 07/30/2019. 2. Again noted is a large infarct involving the right MCA and right ACA territories with brain tissue herniating through the right craniotomy defect. 3. Parenchymal hemorrhage in the right  anterior cortex has minimally changed. Stable petechial hemorrhage in the right basal ganglia region.   08/11/2019 1. New patchy parenchymal hemorrhages along the periphery of the infarcted RIGHT frontoparietal lobe, compatible with hemorrhagic conversion, largest focus underlying the RIGHT frontal bone measures 1.3 cm greatest dimension. 2. Previously described dominant focus of hemorrhage along the anterior margin of the craniectomy site has decreased in size and density compared to previous exams, compatible with expected evolution. 3. Stable herniation of infarcted brain through the RIGHT frontal-parietal-temporal craniectomy site. 4. No additional mass effect or midline shift on today's exam.   Ct Angio Chest Pe W Or Wo Contrast 08/01/2019 Nonocclusive thrombus seen within the left posterior lower lobe segmental artery.    [place] ABD Limited 08/11/2019 Suspected mildly complex seroma versus liquified hematoma in the superficial soft tissues of the RIGHT abdomen, measuring 7.9 cardiomyopathy greatest dimension, surrounding patient's calvarium (location of patient's skull status post partial craniectomy), appearance less suggestive of abscess. The collection seen today by ultrasound is similar to the appearance of the collection on earlier CT abdomen of 08/04/2019.  2D Echocardiogram 07/20/2019  1. The left ventricle has normal systolic function with an ejection fraction of 60-65%. The cavity size was normal. Left ventricular diastolic parameters were normal.  2. The right ventricle has normal systolic function. The cavity was normal. There is no increase in right ventricular wall thickness.  3. The pericardial effusion is circumferential.  4. Trivial pericardial effusion is present.  5. The mitral valve is grossly normal.  6. The tricuspid valve is grossly normal.  7. The aortic valve is tricuspid. No stenosis of the aortic valve.  8. The aorta is normal unless otherwise noted.  9. The aortic  root is normal in size and structure. 10. No cardiac source of embolism identified. 11. When compared to the prior study: No comparison.  LE Dopplers 08/02/2019 Right: Findings consistent with acute deep vein thrombosis involving the right peroneal veins.  Left: Findings consistent with acute deep vein thrombosis involving the left common femoral vein, left femoral vein, left proximal profunda vein, left popliteal vein, left posterior tibial veins, and left peroneal veins. Extending up into left iliac vein  and IVC. 07/21/2019 Right: Findings consistent with acute deep vein thrombosis involving the right peroneal veins. Left: Findings consistent with acute deep vein thrombosis involving the left popliteal vein, left posterior tibial veins, and left peroneal veins.  Vas [place] Transcranial Doppler W Bubbles 07/25/2019 No HITS heard heard at rest. No HITS heard heard during valsalva.   2D echo w/bubble 08/03/2019 Limited bubble study for shunt. No evidence for atrial level right to left shunt.  Ct Abdomen Pelvis W Contrast 08/04/2019 1. Large fluid collection in the lower right anterior abdominal wall subcutaneous fat with what appears to be an internal surgical drain, favored to represent a postoperative hematoma or proteinaceous seroma, however, the possibility of an abscess is not excluded  in light of the patient's fever.  2. Small high attenuation fluid collection along the left pelvic sidewall which likely represents a hematoma, as above.  3. Additional incidental findings, as above.  Ct Head Wo Contrast 08/14/2019 IMPRESSION:  1. Significant increase in the amount of acute hemorrhage along the periphery of patient's infarcted RIGHT hemisphere, most prominently developed adjacent to the posterior margin of the craniectomy site. There is associated increase in mass effect causing increased herniation through the craniectomy defect. The mass effect is also causing increased compression of the RIGHT  lateral ventricle.  2. New intraventricular extension of the acute hemorrhage.  3. No appreciable change of the mild leftward midline shift. No evidence of tonsillar herniation.   CT Head WO Contrast 08/15/19 IMPRESSION: 1. Postoperative changes from prior right craniectomy with extensive mass effect and herniation of the infarcted brain through the craniectomy defect, similar to previous. 2. Associated hemorrhagic transformation within the infarcted right cerebral hemisphere with dominant hematoma measuring up to 10.1 x 5.5 x 5.9 cm, slightly worsened from previous. Associated intraventricular extension and mass effect on the right lateral ventricle without worsening hydrocephalus or ventricular trapping, similar to previous. 3. No other new acute intracranial abnormality.  Ct Head Wo Contrast  08/16/2019 1. Hemorrhagic right ACA and MCA territory infarcts with swollen brain bulging through the craniectomy defect. Intraventricular hemorrhage with ventriculomegaly. No change from 2 days ago.   CXR 08/17/2019 No significant change in AP portable examination. No acute airspace opacity.  PHYSICAL EXAM   General- Well nourished, well developed, middle-aged African-American male. He has right hemicraniectomy surgical incision on the scalp.   Ophthalmologic- fundi not visualized due to noncooperation.  Cardiovascular - Regular rhythm and rate.  Neuro - patient is mildly lethargic but open eyes spontaneously and on voice. Nonverbal, but follow simple commands on the right today. Eyes in right gaze position, barely cross midline, not blinking to visual threat to the left, PERRL. Left facial droop. Tongue protrusion not corporative. Has spontaneous movement of RUE against gravity 4/5 at least and RLE in bed 2+/5 at least. On pain stimulation, mild withdraw of LLE, but no movement of LUE. DTR 1+ and no babinski. Sensation, coordination and gait not tested.   ASSESSMENT/PLAN [name] is a 43 y.o. male with no significant past medical history found down x 2 days nonverbal with L hemiparesis.   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 Trace L midline shift. ELVO at R ICA, R A1, R MCA.  MRI  Large R brain infarct sparing R PCA. Cytotoxic edema but no hemorrhage. Stable trace midline shift.  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 R ICA terminus, R ACA, R MCA occluded.   CT head similar w/ low density and sweddling R ACA and MCA territories, now with 5-22mm midline shift. no hemorrhage  CT head acute R ACA and MCA infarct w/ progressive swelling decompressed through craniectomy defect.   CTA head and neck stable MLS. R ICA occlusion in neck that continues into R ACA and MCA. Mild petechial hemorrhage at basal ganglia  CT Head 8/29 - Unchanged appearance of massive right MCA and ACA territory infarcts with parenchyma extending through decompressive craniectomy. Small focus of suspected  hemorrhage adjacent to the right caudate head.  CT head 07/30/19 - New hematoma within the infarcted tissue   CT head 08/01/2019 stable w/o change  CT head 08/11/19 - new patchy hemorrhage along periphery of infarcted R frontoparietal lobe (likely HT) decreased dominant anterior margin hemorrhage. Stable herniation of infarcted brain through crani site.   CT head 9/20 - worsening with increased HT on the right and new IVH  CT head 08/15/19 - slightly worsened HT from previous.   CT head 9/22 hemorrhagic R ACA and MCA territory infarcts with edematous brain bulging through Crani defect.  No change x2 days  2D Echo EF 60-65%  2D echo w/ bubble ordered no LRS   LE venous doppler DVT in R peroneal, L popliteal, L posterior tibial and L  peroneal  LE venous doppler repeat DVT in R peroneal, L popliteal, L posterior tibial and L peroneal  TCD bubble no HITS, no PFO  LDL 83  HgbA1c 5.4  UDS positive THC  Hypercoagulable and autoimmune work up negative  SCDs for VTE prophylaxis  No antithrombotic prior to admission, treated DVT with heparin IV, then  discontinued due to hemorrhagic conversion with right frontal HT, then resumed heparin IV given extensive DVT and PE, but now heparin IV again discontinued due to worsening HT  Therapy recommendations:  SNF (reassessed by CIR and declined 9/16)  Disposition:  pending   Cyctotoxic cerebral edema  S/p R decompressive hemicraniectomy (Nundkumar) w/ flap R abd 07/19/2019  PICC placed - keep for now per Leonie Man  Given One dose of 23.4% 8/26; 3%  off 8/30 1700  CT repeat 07/30/19 - New parenchymal hemorrhage within the infarcted tissue  Off helmet to further release pressure  CT repeat 08/01/2019 stable w/o change  CT head repeat 08/11/19 - new patchy hemorrhage along periphery of infarcted R frontoparietal lobe (likely HT) but previous right frontal hematoma now much reduced in size  CT head 9/20 worsening right HT with new ICH and IVH  CT Head 9/21 - slightly worsened HT from previous.   CT head 9/22 hemorrhagic R ACA and MCA territory infarcts with edematous brain bulging through Crani defect.  No change x2 days  Monitor Na daily  D/c staples from crani  Right ICA occlusion  MRA and CTA showed right ICA occlusion from origin to terminal  Wife denies any head trauma  Wife stated that pt had recent aggressive exercise with weight lifting  Concerning dissection as working diagnosis of stroke etiology   B LE DVT Small LLL PE  LE venous doppler DVT in R peroneal, L popliteal, L posterior tibial and L peroneal  Etiology unclear  Started IV heparin 07/26/2019  IVC filter placed 8/27. Plan retrieval in 8-12 weeks as an IP in an IR clinic  Hypercoagulable work  up negative   Off SCDs  LE venous doppler 08/02/19 repeat extensive DVT in R peroneal, L popliteal, L posterior tibial and L peroneal up to the IVC filter  CT chest Nonocclusive thrombus seen within the left posterior lower lobe segmental artery.   Treated with IV Heparin but then off secondary to intracranial hemorrhage, then restarted IV heparin given extensive DVT and PE but again stopped due to worsening HT on 9/20  Hematology consult, appreciate help - hypercoag w/u neg.  At risk for recurrent clots   Factor V Leiden, homocysteine, Prot C&S, Prothombin gene mutation all neg.   Abdominal wall hematoma  Large fluid collection seen on CT abdomen  CT abd/pel  concerning for abdominal wall fluid collection likely old hematoma  Dr. Kathyrn Sheriff did fluid aspiration and seems to be old blood component  Fluid aspiration sent for culture 9/11 - no growth < 24 hrs  Repeat ABD [place] complex seroma vs liquified hematoma R abd similar to previous  Seizure-like activity  EEG continuous slowing, excessive beta activity, sleep spindle asymmetry decreased R->related to stroke and sedation. No SZ  Long Term EEG cortical dysfunction in right frontal region  Seizure precautions  Continue Keppra   SIRS/fever/leukocytosis - ? central neurogenic fever vs. ? UTI  Tmax - 101.7->afebrile -> afebrile ->100.9->100.7->102.5->101.1    WBC - 10.7->10.0->9.1->10.9->17.1  CXR x 3 NAD  Blood cultures - neg, repeated again 08/04/19 -> no growth on empiric unasyn -> changed to vanco, cefepime and flagyl 08/05/19->now off  Lactic acid 1.2->2.0  Hepatitis panel 9/12 - neg  On baclofen 10 mg tid and decrease Bromocriptine to 5 mg q8.  buspar 30mg  decreased to bid for central neurogenic fever    Urine culture pseudomonas - sensitive to zosyn  UA 9/21 - WBC 0-5 negative   Repeat B Cx pending  On zosyn now x 5d (end date set in order)   Tachycardia, resolved  HR normal now  Likely due to  fever  Hydration with IVF, TF and free water  On metoprolol to 50 Q8h  EKG ST, rate 120 w/ possible LA enlargement  Troponin series neg x 3    Hyperlipidemia  Home meds:  no statin  LDL 83, goal < 70  Was on lipitor 20 mg daily   D/c statin due to elevated LFT  Elevated LFT, almost resolved  AST - 105-118-98-85-38  ALT - [phone]  CK 2063-852 -[id number]  Hepatitis panel 9/12 - neg  Dysphagia . Secondary to stroke . Dysphagia 1 diet with thin liquid -> NPO due to lethargy with worsening HT  Cortrak w/ TF  Speech on board   Other Stroke Risk Factors  Former Cigarette smoker, quit 3 yrs ago  ETOH use  Substance abuse UDS - positive for THC   Obesity, Body mass index is 29.55 kg/m., recommend weight loss, diet and exercise as appropriate   Other Active Problems  Normocytic anemia - hematology felt d/t ongoing blood draws 10.0->9.5->10.3->10.3  Hospital day # 29  This patient is critically ill due to lethargy, AMS, worsening HT on CT, fever and at significant risk of neurological worsening, death form hemorrhagic conversion, cerebral edema, brain herniation, status epilepticus, respiratory failure. This patient's care requires constant monitoring of vital signs, hemodynamics, respiratory and cardiac monitoring, review of multiple databases, neurological assessment, discussion with family, other specialists and medical decision making of high complexity. I spent 35 minutes of neurocritical care time in the care of this patient.   Rosalin Hawking, MD PhD Stroke Neurology 08/17/2019 9:08 AM  To contact Stroke Continuity provider, please refer to [email or web address] After hours, contact General Neurology

## 2019-08-18 ENCOUNTER — Inpatient Hospital Stay (HOSPITAL_COMMUNITY): Payer: Medicaid Other

## 2019-08-18 DIAGNOSIS — I6521 Occlusion and stenosis of right carotid artery: Secondary | ICD-10-CM

## 2019-08-18 LAB — BASIC METABOLIC PANEL
Anion gap: 8 (ref 5–15)
BUN: 17 mg/dL (ref 6–20)
CO2: 26 mmol/L (ref 22–32)
Calcium: 8.8 mg/dL — ABNORMAL LOW (ref 8.9–10.3)
Chloride: 104 mmol/L (ref 98–111)
Creatinine, Ser: 1.07 mg/dL (ref 0.61–1.24)
GFR calc Af Amer: >60 mL/min (ref >60)
GFR calc non Af Amer: >60 mL/min (ref >60)
Glucose, Bld: 121 mg/dL — ABNORMAL HIGH (ref 70–99)
Potassium: 3.7 mmol/L (ref 3.5–5.1)
Sodium: 138 mmol/L (ref 135–145)

## 2019-08-18 LAB — CBC
HCT: 33.9 % — ABNORMAL LOW (ref 39.0–52.0)
Hemoglobin: 10.8 g/dL — ABNORMAL LOW (ref 13.0–17.0)
MCH: 27.6 pg (ref 26.0–34.0)
MCHC: 31.9 g/dL (ref 30.0–36.0)
MCV: 86.7 fL (ref 80.0–100.0)
Platelets: 221 10*3/uL (ref 150–400)
RBC: 3.91 MIL/uL — ABNORMAL LOW (ref 4.22–5.81)
RDW: 14 % (ref 11.5–15.5)
WBC: 16.6 10*3/uL — ABNORMAL HIGH (ref 4.0–10.5)
nRBC: 0 % (ref 0.0–0.2)

## 2019-08-18 LAB — GLUCOSE, CAPILLARY
Glucose-Capillary: 105 mg/dL — ABNORMAL HIGH (ref 70–99)
Glucose-Capillary: 108 mg/dL — ABNORMAL HIGH (ref 70–99)
Glucose-Capillary: 111 mg/dL — ABNORMAL HIGH (ref 70–99)
Glucose-Capillary: 112 mg/dL — ABNORMAL HIGH (ref 70–99)
Glucose-Capillary: 116 mg/dL — ABNORMAL HIGH (ref 70–99)
Glucose-Capillary: 134 mg/dL — ABNORMAL HIGH (ref 70–99)

## 2019-08-18 LAB — PROCALCITONIN: Procalcitonin: <0.1 ng/mL

## 2019-08-18 LAB — LACTIC ACID, PLASMA: Lactic Acid, Venous: 0.7 mmol/L (ref 0.5–1.9)

## 2019-08-18 MED ORDER — SODIUM CHLORIDE 0.9 % IV SOLN
INTRAVENOUS | Status: DC
  Administered 2019-08-18 – 2019-09-05 (×12): via INTRAVENOUS

## 2019-08-18 MED ORDER — BUSPIRONE HCL 15 MG PO TABS
30.0000 mg | ORAL_TABLET | Freq: Three times a day (TID) | ORAL | Status: DC
  Administered 2019-08-18 – 2019-08-22 (×12): 30 mg
  Filled 2019-08-18 (×12): qty 2

## 2019-08-18 MED ORDER — SODIUM CHLORIDE 0.9 % IV SOLN
INTRAVENOUS | Status: DC | PRN
  Administered 2019-08-18: 250 mL via INTRAVENOUS
  Administered 2019-08-19: 50 mL via INTRAVENOUS

## 2019-08-18 MED ORDER — BROMOCRIPTINE MESYLATE 2.5 MG PO TABS
5.0000 mg | ORAL_TABLET | Freq: Four times a day (QID) | ORAL | Status: DC
  Administered 2019-08-18 – 2019-08-22 (×16): 5 mg
  Filled 2019-08-18 (×18): qty 2

## 2019-08-18 NOTE — Progress Notes (Signed)
Physical Therapy Treatment Patient Details Name: Matthew Black MRN: [id number] DOB: 01-02-1976 Today's Date: 08/18/2019    History of Present Illness Pt is a 43 y.o. M with no known PMH who presents on 8/25 after being found down at work, nonverbal with L hemiparesis. CT head showing large R hemisphere infarct with edema and mild mass effect. 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 Stable trace midline shift. S/p hemicraniectomy for  edema with abdominal flap implant. BLE DVT vasc US 8/28: acute DVT bilateral lower extremities, s/p IVC placement.  On 9/21 Repeat CT stable large hemorrhagic conversion.    PT Comments    Patient progressing slowly but able to use lift for OOB briefly.  Unsafe in recliner due to moving R leg and reaching under his hip and progressively scooting down in chair despite scooting up x 1 so lifted back to bed.  Feel safer to try tilt in space w/c next time for best positioning and safety and likely better for Wale to view the world more upright at times.  PT to follow.    Follow Up Recommendations  SNF     Equipment Recommendations  Wheelchair (measurements PT);Wheelchair cushion (measurements PT);Hospital bed;Other (comment)    Recommendations for Other Services       Precautions / Restrictions Precautions Precautions: Fall Precaution Comments: L hemiparesis, R skull missing, abd bone flap Required Braces or Orthoses: Other Brace Other Brace: helmet (not wearing due to presses on edema on R side of head)    Mobility  Bed Mobility Overal bed mobility: Needs Assistance Bed Mobility: Rolling Rolling: Total assist;+2 for physical assistance         General bed mobility comments: rolling with +2 A for placing lift pad  Transfers                 General transfer comment: bed to chair via maxisky lift then back to bed with maxisky due to poor positioning sliding down in recliner  Ambulation/Gait                  Stairs             Wheelchair Mobility    Modified Rankin (Stroke Patients Only) Modified Rankin (Stroke Patients Only) Pre-Morbid Rankin Score: No symptoms Modified Rankin: Severe disability     Balance Overall balance assessment: Needs assistance   Sitting balance-Leahy Scale: Zero Sitting balance - Comments: sitting in recliner leaning back to R and positioned in midline scooted up and placed neck pillow as halo around head for positioning of neck, but scooted down in chair with moving R LE and reaching under his hip with R hand                                    Cognition Arousal/Alertness: Lethargic Behavior During Therapy: Impulsive   Area of Impairment: Attention;Following commands;Problem solving                   Current Attention Level: Focused   Following Commands: Follows one step commands inconsistently;Follows one step commands with increased time Safety/Judgement: Decreased awareness of safety;Decreased awareness of deficits   Problem Solving: Slow processing;Decreased initiation;Difficulty sequencing;Requires verbal cues;Requires tactile cues General Comments: grimacing with head/neck ROM to L, shaking R leg and moving it everywhere and grabbing under R hip with hand, flexing legs possibly to command, but limited  other command following      Exercises Other Exercises Other Exercises: PROM neck and head rotation L and lateral flexion L with spinal mobs for positioning    General Comments General comments (skin integrity, edema, etc.): wife present and questioning recovery if he will be able to drive eventually, educated no way to tell if his vision is WNL as he cannot communicate it right now      Pertinent Vitals/Pain Pain Assessment: Faces Faces Pain Scale: Hurts a little bit Pain Location: with ROM of head/neck Pain Descriptors / Indicators: Grimacing;Discomfort Pain Intervention(s): Monitored during  session;Repositioned    Home Living                      Prior Function            PT Goals (current goals can now be found in the care plan section) Progress towards PT goals: Progressing toward goals    Frequency           PT Plan Current plan remains appropriate    Co-evaluation              AM-PAC PT "6 Clicks" Mobility   Outcome Measure  Help needed turning from your back to your side while in a flat bed without using bedrails?: Total Help needed moving from lying on your back to sitting on the side of a flat bed without using bedrails?: Total Help needed moving to and from a bed to a chair (including a wheelchair)?: Total Help needed standing up from a chair using your arms (e.g., wheelchair or bedside chair)?: Total Help needed to walk in hospital room?: Total Help needed climbing 3-5 steps with a railing? : Total 6 Click Score: 6    End of Session   Activity Tolerance: Patient tolerated treatment well Patient left: in bed;with nursing/sitter in room;with family/visitor present(nursing bathing patient)   PT Visit Diagnosis: Hemiplegia and hemiparesis;Other symptoms and signs involving the nervous system (R29.898);Other abnormalities of gait and mobility (R26.89) Hemiplegia - Right/Left: Left Hemiplegia - dominant/non-dominant: Non-dominant Hemiplegia - caused by: Cerebral infarction     Time: [id number] PT Time Calculation (min) (ACUTE ONLY): 49 min  Charges:  $Therapeutic Activity: 38-52 mins                     [name], [place] [facility] [phone] 08/18/2019    [name] 08/18/2019, 5:43 PM

## 2019-08-18 NOTE — Progress Notes (Addendum)
STROKE TEAM PROGRESS NOTE   INTERVAL HISTORY Pt seems a little lethargic than yesterday.  Eyes open on voice, however not quite follow commands today.  Still moving on the right side spontaneously.  Scalp staples removed.  CT repeat showed right brain stable hematoma, increased mass-effect, herniating outward from Crani site, making larger right lateral ventricle than before.  Vitals:   08/18/19 0600 08/18/19 0700 08/18/19 0800 08/18/19 0900  BP: 107/82  (!) 79/53 134/85  Pulse: 77     Resp: (!) 21  13 17   Temp:  99 F (37.2 C)    TempSrc:  Rectal    SpO2: 98%     Weight:      Height:        CBC:  Recent Labs  Lab 08/15/19 0607  08/17/19 0500 08/18/19 0435  WBC 9.1   < > 17.1* 16.6*  NEUTROABS 6.7  --   --   --   HGB 10.3*   < > 10.3* 10.8*  HCT 31.1*   < > 32.6* 33.9*  MCV 84.3   < > 87.4 86.7  PLT 220   < > 207 221   < > = values in this interval not displayed.    Basic Metabolic Panel:  Recent Labs  Lab 08/15/19 0607  08/17/19 0500 08/18/19 0435  NA 138   < > 139 138  K 3.8   < > 3.5 3.7  CL 105   < > 105 104  CO2 22   < > 25 26  GLUCOSE 130*   < > 113* 121*  BUN 15   < > 14 17  CREATININE 0.86   < > 0.90 1.07  CALCIUM 8.4*   < > 8.6* 8.8*  MG 2.1  --   --   --   PHOS 3.5  --   --   --    < > = values in this interval not displayed.    IMAGING Ct Head Wo Contrast Ct Cervical Spine Wo Contrast  07/19/2019 1132 1. 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 2. No associated hemorrhage and mild intracranial mass effect at this time, including trace leftward midline shift. 3. Evidence of large vessel occlusion: Hyperdensity of the right ICA terminus, the right A1 and MCA. 4. Unaffected brain parenchyma appears negative. 5.  No acute traumatic injury identified in the cervical spine.   Mr Brain 49 Contrast Mr Angio Head Wo Contrast Mr Angio Neck Wo Contrast 07/19/2019 1330 1. The right ICA is occluded  from its origin. The right ICA terminus, right ACA and MCA also appear occluded. 2. 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 Cytotoxic edema but no convincing hemorrhage. Stable mild mass effect with trace leftward midline shift. 3. Intracranial MRA is degraded by motion artifact, but no other large vessel occlusion is suspected. There appears to be lobes reperfusion of the right PCA. 4. The left hemisphere and posterior fossa brain parenchyma appears normal.   Ct Head Wo Contrast 07/19/2019 1532 Similar appearance to earlier. Low-density and swelling of the right hemisphere in the ACA and MCA territories with mass effect and right-to-left shift of 5-6 mm. No hemorrhagic transformation at this time.   Ct Head Wo Contrast 07/20/2019 0444 1. Acute right ACA and MCA territory infarct with progressive swelling that has decompressed through the craniectomy defect. 2. No acute hemorrhage or new infarction.   Ct Angio Head W Or Wo  Contrast Ct Angio Neck W Or Wo Contrast 07/21/2019 1. Stable from prior MRA. 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 No evidence of atherosclerosis or vasculopathy in the other vessels. 2. Cytotoxic edema causes 5 mm of midline shift and brain bulging through the craniectomy defect. Mild petechial hemorrhage is seen at the basal ganglia.   Ct Head Wo Contrast 07/23/2019 1. Unchanged appearance of massive right MCA and ACA territory infarcts with parenchyma extending through decompressive craniectomy. 2. Small focus of suspected hemorrhage adjacent to the right caudate head.  07/30/2019 1. Decreasing mass effect within large right anterior MCA and ACA territory infarct. 2. Midline shift is no longer present. There is decreased effacement of the right lateral ventricle. 3. Infarcted brain tissue still herniates through the craniectomy site. 4. New parenchymal hemorrhage within the infarcted tissue  anteriorly measures 2.5 x 2.3 x 2.7 cm. 5. No new infarct.  08/01/2019  1. Stable head CT since 07/30/2019. 2. Again noted is a large infarct involving the right MCA and right ACA territories with brain tissue herniating through the right craniotomy defect. 3. Parenchymal hemorrhage in the right anterior cortex has minimally changed. Stable petechial hemorrhage in the right basal ganglia region.   08/11/2019 1. New patchy parenchymal hemorrhages along the periphery of the infarcted RIGHT frontoparietal lobe, compatible with hemorrhagic conversion, largest focus underlying the RIGHT frontal bone measures 1.3 cm greatest dimension. 2. Previously described dominant focus of hemorrhage along the anterior margin of the craniectomy site has decreased in size and density compared to previous exams, compatible with expected evolution. 3. Stable herniation of infarcted brain through the RIGHT frontal-parietal-temporal craniectomy site. 4. No additional mass effect or midline shift on today's exam.   Ct Angio Chest Pe W Or Wo Contrast 08/01/2019 Nonocclusive thrombus seen within the left posterior lower lobe segmental artery.    [place] ABD Limited 08/11/2019 Suspected mildly complex seroma versus liquified hematoma in the superficial soft tissues of the RIGHT abdomen, measuring 7.9 cardiomyopathy greatest dimension, surrounding patient's calvarium (location of patient's skull status post partial craniectomy), appearance less suggestive of abscess. The collection seen today by ultrasound is similar to the appearance of the collection on earlier CT abdomen of 08/04/2019.  2D Echocardiogram 07/20/2019  1. The left ventricle has normal systolic function with an ejection fraction of 60-65%. The cavity size was normal. Left ventricular diastolic parameters were normal.  2. The right ventricle has normal systolic function. The cavity was normal. There is no increase in right ventricular wall thickness.  3. The  pericardial effusion is circumferential.  4. Trivial pericardial effusion is present.  5. The mitral valve is grossly normal.  6. The tricuspid valve is grossly normal.  7. The aortic valve is tricuspid. No stenosis of the aortic valve.  8. The aorta is normal unless otherwise noted.  9. The aortic root is normal in size and structure. 10. No cardiac source of embolism identified. 11. When compared to the prior study: No comparison.  LE Dopplers 08/02/2019 Right: Findings consistent with acute deep vein thrombosis involving the right peroneal veins.  Left: Findings consistent with acute deep vein thrombosis involving the left common femoral vein, left femoral vein, left proximal profunda vein, left popliteal vein, left posterior tibial veins, and left peroneal veins. Extending up into left iliac vein  and IVC. 07/21/2019 Right: Findings consistent with acute deep vein thrombosis involving the right peroneal veins. Left: Findings consistent with acute deep vein  thrombosis involving the left popliteal vein, left posterior tibial veins, and left peroneal veins.  Vas [place] Transcranial Doppler W Bubbles 07/25/2019 No HITS heard heard at rest. No HITS heard heard during valsalva.   2D echo w/bubble 08/03/2019 Limited bubble study for shunt. No evidence for atrial level right to left shunt.  Ct Abdomen Pelvis W Contrast 08/04/2019 1. Large fluid collection in the lower right anterior abdominal wall subcutaneous fat with what appears to be an internal surgical drain, favored to represent a postoperative hematoma or proteinaceous seroma, however, the possibility of an abscess is not excluded in light of the patient's fever.  2. Small high attenuation fluid collection along the left pelvic sidewall which likely represents a hematoma, as above.  3. Additional incidental findings, as above.  Ct Head Wo Contrast 08/14/2019 IMPRESSION:  1. Significant increase in the amount of acute hemorrhage along the  periphery of patient's infarcted RIGHT hemisphere, most prominently developed adjacent to the posterior margin of the craniectomy site. There is associated increase in mass effect causing increased herniation through the craniectomy defect. The mass effect is also causing increased compression of the RIGHT lateral ventricle.  2. New intraventricular extension of the acute hemorrhage.  3. No appreciable change of the mild leftward midline shift. No evidence of tonsillar herniation.   CT Head WO Contrast 08/15/19 IMPRESSION: 1. Postoperative changes from prior right craniectomy with extensive mass effect and herniation of the infarcted brain through the craniectomy defect, similar to previous. 2. Associated hemorrhagic transformation within the infarcted right cerebral hemisphere with dominant hematoma measuring up to 10.1 x 5.5 x 5.9 cm, slightly worsened from previous. Associated intraventricular extension and mass effect on the right lateral ventricle without worsening hydrocephalus or ventricular trapping, similar to previous. 3. No other new acute intracranial abnormality.  Ct Head Wo Contrast  08/16/2019 1. Hemorrhagic right ACA and MCA territory infarcts with swollen brain bulging through the craniectomy defect. Intraventricular hemorrhage with ventriculomegaly. No change from 2 days ago.   CXR 08/17/2019 No significant change in AP portable examination. No acute airspace Opacity.  Ct Head Wo Contrast 08/18/2019 IMPRESSION: 1. Lateral ventriculomegaly appears somewhat increased from prior examination. Consider short interval CT follow-up. 2. Hemorrhagic right ACA and MCA territory infarcts with right cerebral swelling and external herniation through right-sided craniectomy defect, similar to prior exam. Intraventricular hemorrhage has not significantly changed in amount, although with some interval redistribution. 3. Unchanged 7 mm rightward midline shift related to right-sided external  herniation.    PHYSICAL EXAM  General- Well nourished, well developed, middle-aged African-American male. He has right hemicraniectomy surgical incision on the scalp.  Right head bulging from crani site.  Ophthalmologic- fundi not visualized due to noncooperation.  Cardiovascular - Regular rhythm and rate.  Neuro - patient is mildly lethargic but open eyes spontaneously and on voice. Nonverbal, did not follow simple commands on the right today. Eyes in right gaze position, barely cross midline, not blinking to visual threat to the left, PERRL. Left facial droop. Tongue protrusion not corporative. Has spontaneous movement of RUE against gravity 4/5 at least and RLE in bed 2+/5 at least. On pain stimulation, mild withdraw of LLE, but no movement of LUE. DTR 1+ and no babinski. Sensation, coordination and gait not tested.   ASSESSMENT/PLAN Matthew Black is a 42 y.o. male with no significant past medical history found down x 2 days nonverbal with L hemiparesis.   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 Trace L midline shift. ELVO at R ICA, R A1, R MCA.  MRI  Large R brain infarct sparing R PCA. Cytotoxic edema but no hemorrhage. Stable trace midline shift.  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 R ICA terminus, R ACA, R MCA occluded.   CT head similar w/ low density and sweddling R ACA and MCA territories, now with 5-46mm midline shift. no hemorrhage  CT head acute R ACA and MCA infarct w/ progressive swelling decompressed through craniectomy defect.   CTA head and neck stable MLS. R ICA occlusion in neck that continues into R ACA and MCA. Mild petechial hemorrhage at basal ganglia  CT Head 8/29 - Unchanged appearance of massive  right MCA and ACA territory infarcts with parenchyma extending through decompressive craniectomy. Small focus of suspected hemorrhage adjacent to the right caudate head.  CT head 07/30/19 - New hematoma within the infarcted tissue   CT head 08/01/2019 stable w/o change  CT head 08/11/19 - new patchy hemorrhage along periphery of infarcted R frontoparietal lobe (likely HT) decreased dominant anterior margin hemorrhage. Stable herniation of infarcted brain through crani site.   CT head 9/20 - worsening with increased HT on the right and new IVH  CT head 08/15/19 - slightly worsened HT from previous.   CT head 9/22 hemorrhagic R ACA and MCA territory infarcts with edematous brain bulging through Crani defect.  No change x2 days  CT head 9/24 stable hematoma, increased right hemisphere mass-effect, external herniation from Crani site, making right lateral ventriculomegaly than prior images.  2D Echo EF 60-65%  2D echo w/ bubble ordered no LRS   LE venous doppler DVT in R peroneal, L popliteal, L posterior tibial and L peroneal  LE venous doppler repeat DVT in R peroneal, L popliteal, L posterior tibial and L peroneal  TCD bubble no HITS, no PFO  LDL 83  HgbA1c 5.4  UDS positive THC  Hypercoagulable and autoimmune work up negative  SCDs for VTE prophylaxis  No antithrombotic prior to admission, treated DVT with heparin IV, then  discontinued due to hemorrhagic conversion with right frontal HT, then resumed heparin IV given extensive DVT and PE, but now heparin IV again discontinued due to worsening HT  Therapy recommendations:  SNF (reassessed by CIR and declined 9/16)  Disposition:  pending   Cyctotoxic cerebral edema  S/p R decompressive hemicraniectomy (Nundkumar) w/ flap R abd 07/19/2019  PICC placed - keep for now per Leonie Man  Given One dose of 23.4% 8/26; 3%  off 8/30 1700  CT repeat 07/30/19 - New parenchymal hemorrhage within the infarcted tissue  Off helmet to further  release pressure  CT repeat 08/01/2019 stable w/o change  CT head repeat 08/11/19 - new patchy hemorrhage along periphery of infarcted R frontoparietal lobe (likely HT) but previous right frontal hematoma now much reduced in size  CT head 9/20 worsening right HT with new ICH and IVH  CT Head 9/21 - slightly worsened HT from previous.   CT head 9/22 hemorrhagic R ACA and MCA territory infarcts with edematous brain bulging through Crani defect.  No change x2 days  CT head 9/24 stable hematoma, increased right hemisphere mass-effect, external herniation from Crani site, making right lateral ventriculomegaly than prior images. -  discussed with Dr. Kathyrn Sheriff, no need of any intervention, continue to monitor  Monitor Na 141->139->138  Right ICA occlusion  MRA and CTA showed right ICA occlusion from origin to terminal  Wife denies any head trauma  Wife stated that pt had recent aggressive exercise with weight lifting  Concerning dissection as working diagnosis of stroke etiology   B LE DVT Small LLL PE  LE venous doppler DVT in R peroneal, L popliteal, L posterior tibial and L peroneal  Etiology unclear  Started IV heparin 07/26/2019  IVC filter placed 8/27. Plan retrieval in 8-12 weeks as an IP in an IR clinic  Hypercoagulable work up negative   Off SCDs  LE venous doppler 08/02/19 repeat extensive DVT in R peroneal, L popliteal, L posterior tibial and L peroneal up to the IVC filter  CT chest Nonocclusive thrombus seen within the left posterior lower lobe segmental artery.   Treated with IV Heparin but then off secondary to intracranial hemorrhage, then restarted IV heparin given extensive DVT and PE but again stopped due to worsening HT on 9/20  Hematology consult, appreciate help - hypercoag w/u neg.  At risk for recurrent clots   Factor V Leiden, homocysteine, Prot C&S, Prothombin gene mutation all neg.   Abdominal wall hematoma  Large fluid collection seen on CT  abdomen  CT abd/pel concerning for abdominal wall fluid collection likely old hematoma  Dr. Kathyrn Sheriff did fluid aspiration and seems to be old blood component  Fluid aspiration sent for culture 9/11 - no growth < 24 hrs  Repeat ABD [place] complex seroma vs liquified hematoma R abd similar to previous  Seizure-like activity  EEG continuous slowing, excessive beta activity, sleep spindle asymmetry decreased R->related to stroke and sedation. No SZ  Long Term EEG cortical dysfunction in right frontal region  Seizure precautions  Continue Keppra   SIRS/fever/leukocytosis - ? central neurogenic fever vs. ? UTI  Tmax - 101.7->afebrile -> afebrile ->100.9->102.5->101.1->102.8    WBC - 10.7->10.0->9.1->10.9->17.2->16.6  CXR x 3 NAD  Blood cultures - neg, repeated again 08/04/19 -> no growth on empiric unasyn -> changed to vanco, cefepime and flagyl 08/05/19->now off  Lactic acid 1.2->2.0  Hepatitis panel 9/12 - neg  Urine culture pseudomonas - sensitive to zosyn  UA 9/21 - WBC 0-5 negative   UA 9/23 - WBC 11-20  Repeat B Cx pending  On zosyn now x 5d (end date set in order)   procalcitonin and lactic acid normal   On baclofen 10 mg tid. Increase Bromocriptine to 5 mg q6h.  Increase buspar 30mg  tid for central neurogenic fever    Tachycardia, resolved  HR normal now  Likely due to fever  Hydration with IVF, TF and free water  On metoprolol to 50 Q8h  EKG ST, rate 120 w/ possible LA enlargement  Troponin series neg x 3    Hyperlipidemia  Home meds:  no statin  LDL 83, goal < 70  Was on lipitor 20 mg daily   D/c statin due to elevated LFT  Elevated LFT, almost resolved  AST - 105-118-98-85-38  ALT - [phone]  CK 2063-852 -[id number]  Hepatitis panel 9/12 - neg  Dysphagia . Secondary to stroke . Dysphagia 1 diet with thin liquid -> NPO due to lethargy with worsening HT  Cortrak w/ TF  Add NS @ 40cc for IVF  Speech on board   Other  Stroke Risk Factors  Former Cigarette smoker, quit 3 yrs ago  ETOH use  Substance abuse  UDS - positive for THC   Obesity, Body mass index is 29.33 kg/m., recommend weight loss, diet and exercise as appropriate   Other Active Problems  Normocytic anemia - hematology felt d/t ongoing blood draws 10.0->9.5->10.3->10.3->10.8  Hospital day # 30  This patient is critically ill due to lethargy, AMS, worsening HT on CT, fever and at significant risk of neurological worsening, death form hemorrhagic conversion, cerebral edema, brain herniation, status epilepticus, respiratory failure. This patient's care requires constant monitoring of vital signs, hemodynamics, respiratory and cardiac monitoring, review of multiple databases, neurological assessment, discussion with family, other specialists and medical decision making of high complexity. I spent 35 minutes of neurocritical care time in the care of this patient. I discussed with Dr. Kathyrn Sheriff. I had long discussion with wife over the phone, updated pt current condition and recent CT imaging, treatment plan and potential prognosis, and answered all the questions.  She expressed understanding and appreciation.    Rosalin Hawking, MD PhD Stroke Neurology 08/18/2019 10:17 AM  To contact Stroke Continuity provider, please refer to [email or web address] After hours, contact General Neurology

## 2019-08-19 LAB — CBC
HCT: 33.7 % — ABNORMAL LOW (ref 39.0–52.0)
Hemoglobin: 10.7 g/dL — ABNORMAL LOW (ref 13.0–17.0)
MCH: 27.8 pg (ref 26.0–34.0)
MCHC: 31.8 g/dL (ref 30.0–36.0)
MCV: 87.5 fL (ref 80.0–100.0)
Platelets: 226 10*3/uL (ref 150–400)
RBC: 3.85 MIL/uL — ABNORMAL LOW (ref 4.22–5.81)
RDW: 14.1 % (ref 11.5–15.5)
WBC: 12.5 10*3/uL — ABNORMAL HIGH (ref 4.0–10.5)
nRBC: 0 % (ref 0.0–0.2)

## 2019-08-19 LAB — GLUCOSE, CAPILLARY
Glucose-Capillary: 102 mg/dL — ABNORMAL HIGH (ref 70–99)
Glucose-Capillary: 105 mg/dL — ABNORMAL HIGH (ref 70–99)
Glucose-Capillary: 106 mg/dL — ABNORMAL HIGH (ref 70–99)
Glucose-Capillary: 119 mg/dL — ABNORMAL HIGH (ref 70–99)
Glucose-Capillary: 95 mg/dL (ref 70–99)
Glucose-Capillary: 99 mg/dL (ref 70–99)

## 2019-08-19 LAB — BASIC METABOLIC PANEL
Anion gap: 10 (ref 5–15)
BUN: 19 mg/dL (ref 6–20)
CO2: 25 mmol/L (ref 22–32)
Calcium: 8.8 mg/dL — ABNORMAL LOW (ref 8.9–10.3)
Chloride: 105 mmol/L (ref 98–111)
Creatinine, Ser: 1.07 mg/dL (ref 0.61–1.24)
GFR calc Af Amer: >60 mL/min (ref >60)
GFR calc non Af Amer: >60 mL/min (ref >60)
Glucose, Bld: 124 mg/dL — ABNORMAL HIGH (ref 70–99)
Potassium: 3.8 mmol/L (ref 3.5–5.1)
Sodium: 140 mmol/L (ref 135–145)

## 2019-08-19 MED ORDER — ACETAMINOPHEN 325 MG PO TABS
650.0000 mg | ORAL_TABLET | ORAL | Status: DC
  Administered 2019-08-25 – 2019-09-06 (×17): 650 mg via ORAL
  Filled 2019-08-19 (×19): qty 2

## 2019-08-19 MED ORDER — ACETAMINOPHEN 160 MG/5ML PO SOLN
650.0000 mg | ORAL | Status: DC
  Administered 2019-08-19 – 2019-09-06 (×79): 650 mg
  Filled 2019-08-19 (×79): qty 20.3

## 2019-08-19 MED ORDER — ACETAMINOPHEN 650 MG RE SUPP: 650.0000 mg | RECTAL | Status: DC

## 2019-08-19 NOTE — Progress Notes (Signed)
STROKE TEAM PROGRESS NOTE   INTERVAL HISTORY Pt continues to have high grade fever. Neuro stable, following commands on the right. On tylenol. I discussed with Dr. Lynetta Mare, will put tylenol Q4h around the clock but no specific measures.    Vitals:   08/19/19 0800 08/19/19 0900 08/19/19 1000 08/19/19 1100  BP: 125/83 126/84 126/88 116/83  Pulse: 97 (!) 101 93   Resp: 14 17 16 17   Temp: (!) 103.6 F (39.8 C)  (!) 100.6 F (38.1 C)   TempSrc: Rectal  Axillary   SpO2: 100% 98% 98%   Weight:      Height:        CBC:  Recent Labs  Lab 08/15/19 0607  08/18/19 0435 08/19/19 0517  WBC 9.1   < > 16.6* 12.5*  NEUTROABS 6.7  --   --   --   HGB 10.3*   < > 10.8* 10.7*  HCT 31.1*   < > 33.9* 33.7*  MCV 84.3   < > 86.7 87.5  PLT 220   < > 221 226   < > = values in this interval not displayed.    Basic Metabolic Panel:  Recent Labs  Lab 08/15/19 0607  08/18/19 0435 08/19/19 0517  NA 138   < > 138 140  K 3.8   < > 3.7 3.8  CL 105   < > 104 105  CO2 22   < > 26 25  GLUCOSE 130*   < > 121* 124*  BUN 15   < > 17 19  CREATININE 0.86   < > 1.07 1.07  CALCIUM 8.4*   < > 8.8* 8.8*  MG 2.1  --   --   --   PHOS 3.5  --   --   --    < > = values in this interval not displayed.    IMAGING Ct Head Wo Contrast Ct Cervical Spine Wo Contrast  07/19/2019 1132 1. 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 2. No associated hemorrhage and mild intracranial mass effect at this time, including trace leftward midline shift. 3. Evidence of large vessel occlusion: Hyperdensity of the right ICA terminus, the right A1 and MCA. 4. Unaffected brain parenchyma appears negative. 5.  No acute traumatic injury identified in the cervical spine.   Mr Brain 38 Contrast Mr Angio Head Wo Contrast Mr Angio Neck Wo Contrast 07/19/2019 1330 1. 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 The right ICA terminus, right ACA and MCA also appear occluded. 2.  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 Cytotoxic edema but no convincing hemorrhage. Stable mild mass effect with trace leftward midline shift. 3. Intracranial MRA is degraded by motion artifact, but no other large vessel occlusion is suspected. There appears to be lobes reperfusion of the right PCA. 4. The left hemisphere and posterior fossa brain parenchyma appears normal.   Ct Head Wo Contrast 07/19/2019 1532 Similar appearance to earlier. Low-density and swelling of the right hemisphere in the ACA and MCA territories with mass effect and right-to-left shift of 5-6 mm. No hemorrhagic transformation at this time.   Ct Head Wo Contrast 07/20/2019 0444 1. Acute right ACA and MCA territory infarct with progressive swelling that has decompressed through the craniectomy defect. 2. No acute hemorrhage or new infarction.   Ct Angio Head W Or Wo Contrast Ct Angio Neck W Or Wo Contrast 07/21/2019 1. Stable from prior MRA.  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 No evidence of atherosclerosis or vasculopathy in the other vessels. 2. Cytotoxic edema causes 5 mm of midline shift and brain bulging through the craniectomy defect. Mild petechial hemorrhage is seen at the basal ganglia.   Ct Head Wo Contrast 07/23/2019 1. Unchanged appearance of massive right MCA and ACA territory infarcts with parenchyma extending through decompressive craniectomy. 2. Small focus of suspected hemorrhage adjacent to the right caudate head.  07/30/2019 1. Decreasing mass effect within large right anterior MCA and ACA territory infarct. 2. Midline shift is no longer present. There is decreased effacement of the right lateral ventricle. 3. Infarcted brain tissue still herniates through the craniectomy site. 4. New parenchymal hemorrhage within the infarcted tissue anteriorly measures 2.5 x 2.3 x 2.7 cm. 5. No new infarct.  08/01/2019  1. Stable  head CT since 07/30/2019. 2. Again noted is a large infarct involving the right MCA and right ACA territories with brain tissue herniating through the right craniotomy defect. 3. Parenchymal hemorrhage in the right anterior cortex has minimally changed. Stable petechial hemorrhage in the right basal ganglia region.   08/11/2019 1. New patchy parenchymal hemorrhages along the periphery of the infarcted RIGHT frontoparietal lobe, compatible with hemorrhagic conversion, largest focus underlying the RIGHT frontal bone measures 1.3 cm greatest dimension. 2. Previously described dominant focus of hemorrhage along the anterior margin of the craniectomy site has decreased in size and density compared to previous exams, compatible with expected evolution. 3. Stable herniation of infarcted brain through the RIGHT frontal-parietal-temporal craniectomy site. 4. No additional mass effect or midline shift on today's exam.   Ct Angio Chest Pe W Or Wo Contrast 08/01/2019 Nonocclusive thrombus seen within the left posterior lower lobe segmental artery.    [place] ABD Limited 08/11/2019 Suspected mildly complex seroma versus liquified hematoma in the superficial soft tissues of the RIGHT abdomen, measuring 7.9 cardiomyopathy greatest dimension, surrounding patient's calvarium (location of patient's skull status post partial craniectomy), appearance less suggestive of abscess. The collection seen today by ultrasound is similar to the appearance of the collection on earlier CT abdomen of 08/04/2019.  2D Echocardiogram 07/20/2019  1. The left ventricle has normal systolic function with an ejection fraction of 60-65%. The cavity size was normal. Left ventricular diastolic parameters were normal.  2. The right ventricle has normal systolic function. The cavity was normal. There is no increase in right ventricular wall thickness.  3. The pericardial effusion is circumferential.  4. Trivial pericardial effusion is present.  5.  The mitral valve is grossly normal.  6. The tricuspid valve is grossly normal.  7. The aortic valve is tricuspid. No stenosis of the aortic valve.  8. The aorta is normal unless otherwise noted.  9. The aortic root is normal in size and structure. 10. No cardiac source of embolism identified. 11. When compared to the prior study: No comparison.  LE Dopplers 08/02/2019 Right: Findings consistent with acute deep vein thrombosis involving the right peroneal veins.  Left: Findings consistent with acute deep vein thrombosis involving the left common femoral vein, left femoral vein, left proximal profunda vein, left popliteal vein, left posterior tibial veins, and left peroneal veins. Extending up into left iliac vein  and IVC. 07/21/2019 Right: Findings consistent with acute deep vein thrombosis involving the right peroneal veins. Left: Findings consistent with acute deep vein thrombosis involving the left popliteal vein, left posterior tibial veins, and left peroneal veins.  Vas [place] Transcranial Doppler W Bubbles 07/25/2019 No HITS heard heard at rest. No HITS heard heard during valsalva.   2D echo w/bubble 08/03/2019 Limited bubble study for shunt. No evidence for atrial level right to left shunt.  Ct Abdomen Pelvis W Contrast 08/04/2019 1. Large fluid collection in the lower right anterior abdominal wall subcutaneous fat with what appears to be an internal surgical drain, favored to represent a postoperative hematoma or proteinaceous seroma, however, the possibility of an abscess is not excluded in light of the patient's fever.  2. Small high attenuation fluid collection along the left pelvic sidewall which likely represents a hematoma, as above.  3. Additional incidental findings, as above.  Ct Head Wo Contrast 08/14/2019 IMPRESSION:  1. Significant increase in the amount of acute hemorrhage along the periphery of patient's infarcted RIGHT hemisphere, most prominently developed adjacent to the  posterior margin of the craniectomy site. There is associated increase in mass effect causing increased herniation through the craniectomy defect. The mass effect is also causing increased compression of the RIGHT lateral ventricle.  2. New intraventricular extension of the acute hemorrhage.  3. No appreciable change of the mild leftward midline shift. No evidence of tonsillar herniation.   CT Head WO Contrast 08/15/19 IMPRESSION: 1. Postoperative changes from prior right craniectomy with extensive mass effect and herniation of the infarcted brain through the craniectomy defect, similar to previous. 2. Associated hemorrhagic transformation within the infarcted right cerebral hemisphere with dominant hematoma measuring up to 10.1 x 5.5 x 5.9 cm, slightly worsened from previous. Associated intraventricular extension and mass effect on the right lateral ventricle without worsening hydrocephalus or ventricular trapping, similar to previous. 3. No other new acute intracranial abnormality.  Ct Head Wo Contrast  08/16/2019 1. Hemorrhagic right ACA and MCA territory infarcts with swollen brain bulging through the craniectomy defect. Intraventricular hemorrhage with ventriculomegaly. No change from 2 days ago.   CXR 08/17/2019 No significant change in AP portable examination. No acute airspace Opacity.  Ct Head Wo Contrast 08/18/2019 IMPRESSION: 1. Lateral ventriculomegaly appears somewhat increased from prior examination. Consider short interval CT follow-up. 2. Hemorrhagic right ACA and MCA territory infarcts with right cerebral swelling and external herniation through right-sided craniectomy defect, similar to prior exam. Intraventricular hemorrhage has not significantly changed in amount, although with some interval redistribution. 3. Unchanged 7 mm rightward midline shift related to right-sided external herniation.    PHYSICAL EXAM General- Well nourished, well developed, middle-aged  African-American male. He has right hemicraniectomy surgical incision on the scalp.  Right head bulging from crani site.  Ophthalmologic- fundi not visualized due to noncooperation.  Cardiovascular - Regular rhythm and rate.  Neuro - patient is mildly lethargic but open eyes spontaneously and on voice. Nonverbal, follow all simple commands on the right. Eyes in right gaze position, barely cross midline, not blinking to visual threat to the left, PERRL. Left facial droop. Tongue protrusion not corporative. Has spontaneous movement of RUE against gravity 4/5 at least and RLE in bed 3/5 at least. On pain stimulation, mild withdraw of LLE, but no movement of LUE. DTR 1+ and no babinski. Sensation, coordination and gait not tested.   ASSESSMENT/PLAN [name] is a 43 y.o. male with no significant past medical history found down x 2 days nonverbal with L hemiparesis.   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 Trace L midline shift. ELVO at R ICA, R A1, R MCA.  MRI  Large R brain infarct sparing R PCA. Cytotoxic edema but no hemorrhage. Stable trace midline shift.  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 R ICA terminus, R ACA, R MCA occluded.   CT head similar w/ low density and sweddling R ACA and MCA territories, now with 5-38mm midline shift. no hemorrhage  CT head acute R ACA and MCA infarct w/ progressive swelling decompressed through craniectomy defect.   CTA head and neck stable MLS. R ICA occlusion in neck that continues into R ACA and MCA. Mild petechial hemorrhage at basal ganglia  CT Head 8/29 - Unchanged appearance of massive right MCA and ACA territory infarcts with parenchyma extending through decompressive craniectomy.  Small focus of suspected hemorrhage adjacent to the right caudate head.  CT head 07/30/19 - New hematoma within the infarcted tissue   CT head 08/01/2019 stable w/o change  CT head 08/11/19 - new patchy hemorrhage along periphery of infarcted R frontoparietal lobe (likely HT) decreased dominant anterior margin hemorrhage. Stable herniation of infarcted brain through crani site.   CT head 9/20 - worsening with increased HT on the right and new IVH  CT head 08/15/19 - slightly worsened HT from previous.   CT head 9/22 hemorrhagic R ACA and MCA territory infarcts with edematous brain bulging through Crani defect.  No change x2 days  CT head 9/24 stable hematoma, increased right hemisphere mass-effect, external herniation from Crani site, making right lateral ventriculomegaly than prior images.  2D Echo EF 60-65%  2D echo w/ bubble ordered no LRS   LE venous doppler DVT in R peroneal, L popliteal, L posterior tibial and L peroneal  LE venous doppler repeat DVT in R peroneal, L popliteal, L posterior tibial and L peroneal  TCD bubble no HITS, no PFO  LDL 83  HgbA1c 5.4  UDS positive THC  Hypercoagulable and autoimmune work up negative  SCDs for VTE prophylaxis  No antithrombotic prior to admission, treated DVT with heparin IV, then  discontinued due to hemorrhagic conversion with right frontal HT, then resumed heparin IV given extensive DVT and PE, but now heparin IV again discontinued due to worsening HT  Therapy recommendations:  SNF (reassessed by CIR and declined 9/16)  Disposition:  pending   Cyctotoxic cerebral edema  S/p R decompressive hemicraniectomy (Nundkumar) w/ flap R abd 07/19/2019  PICC placed - keep for now per Leonie Man  Given One dose of 23.4% 8/26; 3%  off 8/30 1700  CT repeat 07/30/19 - New parenchymal hemorrhage within the infarcted tissue  Off helmet to further release pressure  CT repeat 08/01/2019 stable w/o change  CT head repeat 08/11/19 - new patchy  hemorrhage along periphery of infarcted R frontoparietal lobe (likely HT) but previous right frontal hematoma now much reduced in size  CT head 9/20 worsening right HT with new ICH and IVH  CT Head 9/21 - slightly worsened HT from previous.   CT head 9/22 hemorrhagic R ACA and MCA territory infarcts with edematous brain bulging through Crani defect.  No change x2 days  CT head 9/24 stable hematoma, increased right hemisphere mass-effect, external herniation from Crani site, making right lateral ventriculomegaly than prior images. - discussed with Dr. Kathyrn Sheriff, no need of any intervention, continue to monitor  Monitor Na 141->139->138  Right  ICA occlusion  MRA and CTA showed right ICA occlusion from origin to terminal  Wife denies any head trauma  Wife stated that pt had recent aggressive exercise with weight lifting  Concerning dissection as working diagnosis of stroke etiology   B LE DVT Small LLL PE  LE venous doppler DVT in R peroneal, L popliteal, L posterior tibial and L peroneal  Etiology unclear  Started IV heparin 07/26/2019  IVC filter placed 8/27. Plan retrieval in 8-12 weeks as an IP in an IR clinic  Hypercoagulable work up negative   Off SCDs  LE venous doppler 08/02/19 repeat extensive DVT in R peroneal, L popliteal, L posterior tibial and L peroneal up to the IVC filter  CT chest Nonocclusive thrombus seen within the left posterior lower lobe segmental artery.   Treated with IV Heparin but then off secondary to intracranial hemorrhage, then restarted IV heparin given extensive DVT and PE but again stopped due to worsening HT on 9/20  Hematology consult, appreciate help - hypercoag w/u neg.  At risk for recurrent clots   Factor V Leiden, homocysteine, Prot C&S, Prothombin gene mutation all neg.   Abdominal wall hematoma  Large fluid collection seen on CT abdomen  CT abd/pel concerning for abdominal wall fluid collection likely old hematoma  Dr. Kathyrn Sheriff  did fluid aspiration and seems to be old blood component  Fluid aspiration sent for culture 9/11 - no growth < 24 hrs  Repeat ABD [place] complex seroma vs liquified hematoma R abd similar to previous  Seizure-like activity  EEG continuous slowing, excessive beta activity, sleep spindle asymmetry decreased R->related to stroke and sedation. No SZ  Long Term EEG cortical dysfunction in right frontal region  Seizure precautions  Continue Keppra   SIRS/fever/leukocytosis - absorbing fever due to necrotic tissue most likely  Tmax - 101.7->afebrile -> afebrile ->100.9->102.5->101.1->102.8->103.7    WBC - 10.7->10.0->9.1->10.9->17.2->16.6->12.5  CXR x 3 NAD  Blood cultures - neg, repeated again 08/04/19 -> no growth on empiric unasyn -> changed to vanco, cefepime and flagyl 08/05/19->now off  Lactic acid 1.2->2.0  Hepatitis panel 9/12 - neg  procalcitonin and lactic acid normal   Continue baclofen 10 mg tid, bromocriptine 5 mg q6h, and 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 Lynetta Mare - no new recommendations  UTI  Urine culture pseudomonas - sensitive to zosyn  UA 9/21 - WBC 0-5 negative   UA 9/23 - WBC 11-20  Repeat B Cx no growth 1 day  On zosyn now x 5d (end date set in order)   Tachycardia, resolved  HR normal now  Likely due to fever  Hydration with IVF, TF and free water  On metoprolol to 50 Q8h  EKG ST, rate 120 w/ possible LA enlargement  Troponin series neg x 3    Hyperlipidemia  Home meds:  no statin  LDL 83, goal < 70  Was on lipitor 20 mg daily   D/c statin due to elevated LFT  Elevated LFT, almost resolved  AST - 105-118-98-85-38  ALT - [phone]  CK 2063-852 [id number]  Hepatitis panel 9/12 - neg  Dysphagia . Secondary to stroke . Dysphagia 1 diet with thin liquid -> NPO due to lethargy with worsening HT  Cortrak w/ TF  Add NS @ 40cc for IVF  Speech on board   Other  Stroke Risk Factors  Former Cigarette smoker, quit 3 yrs ago  ETOH use  Substance abuse UDS -  positive for THC   Obesity, Body mass index is 29.7 kg/m., recommend weight loss, diet and exercise as appropriate   Other Active Problems  Normocytic anemia - hematology felt d/t ongoing blood draws 10.0->9.5->10.3->10.3->10.8->10.7  Hospital day # 31  This patient is critically ill due to lethargy, AMS, worsening HT on CT, fever and at significant risk of neurological worsening, death form hemorrhagic conversion, cerebral edema, brain herniation, status epilepticus, respiratory failure. This patient's care requires constant monitoring of vital signs, hemodynamics, respiratory and cardiac monitoring, review of multiple databases, neurological assessment, discussion with family, other specialists and medical decision making of high complexity. I spent 35 minutes of neurocritical care time in the care of this patient. I discussed with Dr. Lynetta Mare.   Rosalin Hawking, MD PhD Stroke Neurology 08/19/2019 11:18 AM  To contact Stroke Continuity provider, please refer to [email or web address] After hours, contact General Neurology

## 2019-08-19 NOTE — Progress Notes (Signed)
  Speech Language Pathology Treatment: Dysphagia  Patient Details Name: Matthew Black MRN: [id number] DOB: [birth year] Today's Date: 08/19/2019 Time: [id number] SLP Time Calculation (min) (ACUTE ONLY): 13 min  Assessment / Plan / Recommendation Clinical Impression  Pt is not following commands today despite cueing, but he still will consistently open his lips to accept PO trials. He's drowsy but becomes more alert once repositioned and given stimulation. Oral care was performed with pt sealing his lips around the swab but not biting down on it. Oral transit seems mildly delayed with ice chips and spoons today, and he cannot retrieve thin liquids via straw. His vocal quality could not be assessed throughout trials but delayed, strong coughing was noted after completion. Recommend that he continue to receive temporary, alternative means of nutrition but allowing ice chips after oral care with full supervision from staff. He should be awake and orally accepting in order to try them. Will continue to follow for readiness to repeat MBS as he becomes more alert.    HPI HPI: Pt is a 43 y.o. with no known PMH who presents on 8/25 after being found down at work, nonverbal with L hemiparesis. 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 Stable trace midline shift. S/p hemicraniectomy with abdominal flap implant. Intubated 8/26-8/31. CXR worsened atelectasis in the right lower lobe. MBS completed 08/09/2019 with recommendation for Dys1/thin liquids. Pt demonstrated increased lethargy and vomiting. Hemorrhagic conversion on CT, transferred back to ICU. Head CT 08/14/19 with increase in hemorrhage, edema, mass effect and compression of ventricle      SLP Plan  Continue with current plan of care       Recommendations  Diet recommendations: NPO;Other(comment)(could try ice chips after oral care if alert and accepting) Medication Administration: Via alternative means              Oral Care Recommendations: Oral care prior to ice chip/H20;Oral care QID Follow up Recommendations: Skilled Nursing facility SLP Visit Diagnosis: Dysphagia, unspecified (R13.10) Plan: Continue with current plan of care       GO                Venita Sheffield Everett Ehrler 08/19/2019, 12:06 PM  Pollyann Glen, M.A. Power Acute Environmental education officer [phone] Office [phone]

## 2019-08-20 ENCOUNTER — Inpatient Hospital Stay (HOSPITAL_COMMUNITY): Payer: Medicaid Other

## 2019-08-20 DIAGNOSIS — I82442 Acute embolism and thrombosis of left tibial vein: Secondary | ICD-10-CM

## 2019-08-20 DIAGNOSIS — I82412 Acute embolism and thrombosis of left femoral vein: Secondary | ICD-10-CM

## 2019-08-20 DIAGNOSIS — T8149XA Infection following a procedure, other surgical site, initial encounter: Secondary | ICD-10-CM

## 2019-08-20 DIAGNOSIS — Z8673 Personal history of transient ischemic attack (TIA), and cerebral infarction without residual deficits: Secondary | ICD-10-CM

## 2019-08-20 DIAGNOSIS — I82432 Acute embolism and thrombosis of left popliteal vein: Secondary | ICD-10-CM

## 2019-08-20 DIAGNOSIS — I824Y2 Acute embolism and thrombosis of unspecified deep veins of left proximal lower extremity: Secondary | ICD-10-CM

## 2019-08-20 DIAGNOSIS — Z885 Allergy status to narcotic agent status: Secondary | ICD-10-CM

## 2019-08-20 DIAGNOSIS — Z9911 Dependence on respirator [ventilator] status: Secondary | ICD-10-CM

## 2019-08-20 DIAGNOSIS — I82451 Acute embolism and thrombosis of right peroneal vein: Secondary | ICD-10-CM

## 2019-08-20 DIAGNOSIS — Z87891 Personal history of nicotine dependence: Secondary | ICD-10-CM

## 2019-08-20 DIAGNOSIS — G060 Intracranial abscess and granuloma: Secondary | ICD-10-CM

## 2019-08-20 DIAGNOSIS — I8291 Chronic embolism and thrombosis of unspecified vein: Secondary | ICD-10-CM

## 2019-08-20 LAB — BASIC METABOLIC PANEL
Anion gap: 10 (ref 5–15)
BUN: 18 mg/dL (ref 6–20)
CO2: 21 mmol/L — ABNORMAL LOW (ref 22–32)
Calcium: 8.4 mg/dL — ABNORMAL LOW (ref 8.9–10.3)
Chloride: 112 mmol/L — ABNORMAL HIGH (ref 98–111)
Creatinine, Ser: 0.93 mg/dL (ref 0.61–1.24)
GFR calc Af Amer: >60 mL/min (ref >60)
GFR calc non Af Amer: >60 mL/min (ref >60)
Glucose, Bld: 111 mg/dL — ABNORMAL HIGH (ref 70–99)
Potassium: 3.5 mmol/L (ref 3.5–5.1)
Sodium: 143 mmol/L (ref 135–145)

## 2019-08-20 LAB — CBC
HCT: 32.1 % — ABNORMAL LOW (ref 39.0–52.0)
Hemoglobin: 10.6 g/dL — ABNORMAL LOW (ref 13.0–17.0)
MCH: 28.5 pg (ref 26.0–34.0)
MCHC: 33 g/dL (ref 30.0–36.0)
MCV: 86.3 fL (ref 80.0–100.0)
Platelets: 218 10*3/uL (ref 150–400)
RBC: 3.72 MIL/uL — ABNORMAL LOW (ref 4.22–5.81)
RDW: 13.9 % (ref 11.5–15.5)
WBC: 10 10*3/uL (ref 4.0–10.5)
nRBC: 0 % (ref 0.0–0.2)

## 2019-08-20 LAB — GLUCOSE, CAPILLARY
Glucose-Capillary: 108 mg/dL — ABNORMAL HIGH (ref 70–99)
Glucose-Capillary: 100 mg/dL — ABNORMAL HIGH (ref 70–99)
Glucose-Capillary: 110 mg/dL — ABNORMAL HIGH (ref 70–99)
Glucose-Capillary: 100 mg/dL — ABNORMAL HIGH (ref 70–99)
Glucose-Capillary: 112 mg/dL — ABNORMAL HIGH (ref 70–99)
Glucose-Capillary: 109 mg/dL — ABNORMAL HIGH (ref 70–99)

## 2019-08-20 MED ORDER — VANCOMYCIN HCL 10 G IV SOLR
2000.0000 mg | Freq: Once | INTRAVENOUS | Status: AC
  Administered 2019-08-20: 2000 mg via INTRAVENOUS
  Filled 2019-08-20: qty 2000

## 2019-08-20 MED ORDER — SODIUM CHLORIDE 0.9 % IV SOLN
INTRAVENOUS | Status: DC | PRN
  Administered 2019-08-20: 500 mL via INTRAVENOUS

## 2019-08-20 MED ORDER — VANCOMYCIN HCL IN DEXTROSE 1-5 GM/200ML-% IV SOLN
1000.0000 mg | Freq: Three times a day (TID) | INTRAVENOUS | Status: DC
  Administered 2019-08-20 – 2019-08-23 (×8): 1000 mg via INTRAVENOUS
  Filled 2019-08-20 (×9): qty 200

## 2019-08-20 MED ORDER — SODIUM CHLORIDE 0.9 % IV SOLN
2.0000 g | Freq: Three times a day (TID) | INTRAVENOUS | Status: DC
  Administered 2019-08-20 – 2019-09-06 (×51): 2 g via INTRAVENOUS
  Filled 2019-08-20 (×53): qty 2

## 2019-08-20 MED ORDER — LORAZEPAM 2 MG/ML IJ SOLN
0.5000 mg | Freq: Once | INTRAMUSCULAR | Status: AC
  Administered 2019-08-20: 11:00:00 2 mg via INTRAVENOUS
  Filled 2019-08-20: qty 1

## 2019-08-20 MED ORDER — SODIUM CHLORIDE 0.9 % IV SOLN
2.0000 g | Freq: Three times a day (TID) | INTRAVENOUS | Status: DC
  Filled 2019-08-20 (×4): qty 2

## 2019-08-20 MED ORDER — GADOBUTROL 1 MMOL/ML IV SOLN
9.0000 mL | Freq: Once | INTRAVENOUS | Status: AC | PRN
  Administered 2019-08-20: 11:00:00 9 mL via INTRAVENOUS

## 2019-08-20 NOTE — Consult Note (Signed)
Date of Admission:  07/19/2019          Reason for Consult: Abscess under craniectomy site with concern for extension into the brain itself with cerebritis    Referring Provider: Dr Kathyrn Sheriff   Assessment:  1. 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 CVA hemorrhagic conversion 3. Pulmonary embolism 4. Respiratory failure with ventilator dependence 5.  Plan:  1. vancomycin and cefepime are quite reasonable choices 2. Hopefully aspiration of the abscess will potentially allow [place] to narrow his coverage 3. If not already involved down the road I would strongly consider a palliative care consult for this very unfortunate gentleman and his wife.  Active Problems:   Stroke (cerebrum) (HCC)   Pressure injury of skin   Acute respiratory failure with hypoxemia (HCC)   Endotracheal tube present   Deep vein thrombosis (DVT) of non-extremity vein   Hypokalemia   FUO (fever of unknown origin)   Acute blood loss anemia   SIRS (systemic inflammatory response syndrome) (HCC)   Leukocytosis   Primary hypercoagulable state ([place])   Acute pulmonary embolism without acute cor pulmonale (HCC)   Altered mental status   Scheduled Meds: . acetaminophen  650 mg Oral Q4H   Or  . acetaminophen (TYLENOL) oral liquid 160 mg/5 mL  650 mg Per Tube Q4H   Or  . acetaminophen  650 mg Rectal Q4H  . baclofen  10 mg Per Tube TID  . bromocriptine  5 mg Per Tube Q6H  . busPIRone  30 mg Per Tube TID  . chlorhexidine  15 mL Mouth Rinse BID  . Chlorhexidine Gluconate Cloth  6 each Topical Daily  . famotidine  20 mg Per Tube BID  . feeding supplement (PRO-STAT SUGAR FREE 64)  30 mL Per Tube TID  . levETIRAcetam  500 mg Per Tube BID  . mouth rinse  15 mL Mouth Rinse q12n4p  . metoprolol tartrate  50 mg Per Tube Q8H  . sodium chloride flush  10-40 mL Intracatheter Q12H   Continuous Infusions: .  sodium chloride 40 mL/hr at 08/20/19 0600  . sodium chloride Stopped (08/20/19 0409)  . ceFEPime (MAXIPIME) IV    . feeding supplement (OSMOLITE 1.5 CAL) Stopped (08/20/19 0100)  . vancomycin 2,000 mg (08/20/19 1518)  . vancomycin     PRN Meds:.sodium chloride, labetalol, ondansetron (ZOFRAN) IV, senna-docusate, sodium chloride flush, traMADol  HPI: Matthew Black is a 43 y.o. male Matthew Black is an 43 y.o. male without significant known PMH who presented on 8/25 with AMS as well as L hemiparesis and aphasia.  UDS + for THC.  CT with large R hemispheric infarct with confluent cytotoxic edema in R ACA and MCA territories and mild mass effect.  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  He was admitted to [facility] with neurology consultation and started on 3% saline.  Neurosurgery was consulted and Dr. Kathyrn Sheriff recommended early decompressive hemicraniectomy, which was performed that night.   He subsequently developed hemorrhagic conversion with hematoma on 9/5; helmet was removed to further release intracranial pressure and heparin was discontinued. LE doppler also indicated DVT in R peroneal, L popliteal, L posterior tibial, and L peroneal veins; IVC filter was placed.   He had seizure-like activity, for which he is on Keppra.  He was subsequently found to have possible RLL infiltrate and with concern for aspiration he was made  NPO and started on Unasyn.  CTA was performed and it showed a non-occlusive LLL thrombus last night.    Subsequent repeat DVT US was performed today and showed acute R DVT of the peroneal veins as well as acute L DVT involving common femoral vein, L femoral vein, L proximal profunda vein, L popliteal vein, L posterior tibial veins, and L peroneal veins - and it extends up into the left iliac vein and IVC.  After 5 days of Unasyn he was febrile again and antibiotics were escalated to vancomycin cefepime and metronidazole and these were continued for an additional  4 days.  Does not appear that there was a clear-cut target for those antibiotics while he was on them and they ultimately were discontinued.  Interim became febrile again and he was initiated on Zosyn for possible pneumonia.  He then had with vomiting and purulent drainage from his surgical site.  MRI of the brain was performed which showed an abscess adjacent to the scalp and anterior Lee with peripheral contrast enhancement.  This is been reviewed by neuroradiology and neurosurgery.  Dr. Kathyrn Sheriff leaves of the majority of the tissue there is extracranial herniated tissue that is brain rather than pus.  There is enhancement along the right frontal and parietal regions consistent with cerebritis.  There is also right temporal occipital hematoma which has peripheral contrast enhancement as well now.  There is also diffuse hemispheric edema.  Dr Kathyrn Sheriff has come to conclusion with after consultation with his partners that operative debridement surgical site will lead to significant brain herniation and require resections a large proportion to the right hemisphere and would risk potential brainstem shift.  He is initiated vancomycin and cefepime and is going to attempt percutaneous aspiration of the purulent pocket tomorrow.  I think choice of antimicrobials is quite rational and will be optimized for CNS penetration.      Review of Systems: Review of Systems  Unable to perform ROS: Patient nonverbal    Past Medical History:  Diagnosis Date  . 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 Smoking status: Former Smoker    Packs/day: 0.00    Quit date: 11/24/2015    Years since quitting: 3.7  . Smokeless tobacco: Never Used  . Tobacco comment:    Substance Use Topics  . Alcohol use: Yes    Comment: occasionally  . Drug use: Never    Family History  Problem Relation Age of Onset  . Diabetes Other        GF  . Healthy Mother   . Healthy Father     Allergies  Allergen Reactions  . Hydrocodone Nausea Only    OBJECTIVE: Blood pressure 116/89, pulse (!) 107, temperature (!) 100.7 F (38.2 C), temperature source Axillary, resp. rate 13, height 5\' 11"  (1.803 m), weight 92.2 kg, SpO2 100 %.  Physical Exam Constitutional:      Appearance: He is ill-appearing and diaphoretic.  HENT:     Head:      Nose: Nose normal.  Eyes:     Extraocular Movements: Extraocular movements intact.  Cardiovascular:     Rate and Rhythm: Regular rhythm. Tachycardia present.     Heart sounds: No murmur. No friction rub. No gallop.   Pulmonary:     Effort: No respiratory distress.     Breath sounds: No stridor. No wheezing or rhonchi.  Abdominal:     General: There is no distension.  Palpations: There is no mass.     Tenderness: There is no abdominal tenderness.     Hernia: No hernia is present.  Skin:    General: Skin is warm.  Neurological:     Mental Status: He is disoriented.     Lab Results Lab Results  Component Value Date   WBC 10.0 08/20/2019   HGB 10.6 (L) 08/20/2019   HCT 32.1 (L) 08/20/2019   MCV 86.3 08/20/2019   PLT 218 08/20/2019    Lab Results  Component Value Date   CREATININE 0.93 08/20/2019   BUN 18 08/20/2019   NA 143 08/20/2019   K 3.5 08/20/2019   CL 112 (H) 08/20/2019   CO2 21 (L) 08/20/2019    Lab Results  Component Value Date   ALT 64 (H) 08/17/2019   AST 38 08/17/2019   ALKPHOS 98 08/17/2019   BILITOT 0.4 08/17/2019     Microbiology: Recent Results (from the past 240 hour(s))  Culture, Urine     Status: Abnormal   Collection Time: 08/13/19  4:27 PM   Specimen: Urine, Clean Catch  Result Value Ref Range Status   Specimen Description URINE, CLEAN CATCH  Final   Special Requests   Final    NONE Performed at [facility], [place] [address] [place], [place] [address]    Culture >=100,000 COLONIES/mL PSEUDOMONAS AERUGINOSA (A)  Final   Report Status 08/15/2019 FINAL  Final   Organism ID,  Bacteria PSEUDOMONAS AERUGINOSA (A)  Final      Susceptibility   Pseudomonas aeruginosa - MIC*    CEFTAZIDIME 16 INTERMEDIATE Intermediate     CIPROFLOXACIN 1 SENSITIVE Sensitive     GENTAMICIN <=1 SENSITIVE Sensitive     IMIPENEM 2 SENSITIVE Sensitive     PIP/TAZO 32 SENSITIVE Sensitive     CEFEPIME 16 INTERMEDIATE Intermediate     * >=100,000 COLONIES/mL PSEUDOMONAS AERUGINOSA  Culture, blood (Routine X 2) w Reflex to ID Panel     Status: None (Preliminary result)   Collection Time: 08/17/19 10:19 AM   Specimen: BLOOD  Result Value Ref Range Status   Specimen Description BLOOD LEFT ANTECUBITAL  Final   Special Requests   Final    BOTTLES DRAWN AEROBIC ONLY Blood Culture adequate volume   Culture   Final    NO GROWTH 3 DAYS Performed at [facility], [address] [place], [place] [address]    Report Status PENDING  Incomplete  Culture, blood (Routine X 2) w Reflex to ID Panel     Status: None (Preliminary result)   Collection Time: 08/17/19 10:27 AM   Specimen: BLOOD LEFT HAND  Result Value Ref Range Status   Specimen Description BLOOD LEFT HAND  Final   Special Requests   Final    BOTTLES DRAWN AEROBIC ONLY Blood Culture results may not be optimal due to an inadequate volume of blood received in culture bottles   Culture   Final    NO GROWTH 3 DAYS Performed at [facility], [place] [address], [place], [place] [address]    Report Status PENDING  Incomplete    Alcide Evener, [place] [facility] [phone] pager  08/20/2019, 3:26 PM

## 2019-08-20 NOTE — Progress Notes (Signed)
STROKE TEAM PROGRESS NOTE   INTERVAL HISTORY Bulging crani site, NSY aware, neurohospitalist called at [facility]. MRI with abscess. Discussed with NSY PA and Physician Dr. Kathyrn Sheriff, starting Abx.   Not following commands as well as prior but moves right arm to voice command consistently.  Vitals:   08/20/19 0600 08/20/19 0800 08/20/19 1200 08/20/19 1600  BP: 116/89     Pulse:      Resp: 13     Temp:  (!) 100.7 F (38.2 C) (!) 100.4 F (38 C) (!) 101.4 F (38.6 C)  TempSrc:  Axillary Axillary Axillary  SpO2:      Weight:      Height:        CBC:  Recent Labs  Lab 08/15/19 0607  08/19/19 0517 08/20/19 0411  WBC 9.1   < > 12.5* 10.0  NEUTROABS 6.7  --   --   --   HGB 10.3*   < > 10.7* 10.6*  HCT 31.1*   < > 33.7* 32.1*  MCV 84.3   < > 87.5 86.3  PLT 220   < > 226 218   < > = values in this interval not displayed.    Basic Metabolic Panel:  Recent Labs  Lab 08/15/19 0607  08/19/19 0517 08/20/19 0411  NA 138   < > 140 143  K 3.8   < > 3.8 3.5  CL 105   < > 105 112*  CO2 22   < > 25 21*  GLUCOSE 130*   < > 124* 111*  BUN 15   < > 19 18  CREATININE 0.86   < > 1.07 0.93  CALCIUM 8.4*   < > 8.8* 8.4*  MG 2.1  --   --   --   PHOS 3.5  --   --   --    < > = values in this interval not displayed.    IMAGING  Ct Head Wo Contrast Ct Cervical Spine Wo Contrast  07/19/2019 1132 1. 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 2. No associated hemorrhage and mild intracranial mass effect at this time, including trace leftward midline shift. 3. Evidence of large vessel occlusion: Hyperdensity of the right ICA terminus, the right A1 and MCA. 4. Unaffected brain parenchyma appears negative. 5.  No acute traumatic injury identified in the cervical spine.   Mr Brain 61 Contrast Mr Angio Head Wo Contrast Mr Angio Neck Wo Contrast 07/19/2019 1330 1. 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 The right ICA terminus, right ACA and  MCA also appear occluded. 2. 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 Cytotoxic edema but no convincing hemorrhage. Stable mild mass effect with trace leftward midline shift. 3. Intracranial MRA is degraded by motion artifact, but no other large vessel occlusion is suspected. There appears to be lobes reperfusion of the right PCA. 4. The left hemisphere and posterior fossa brain parenchyma appears normal.   Ct Head Wo Contrast 07/19/2019 1532 Similar appearance to earlier. Low-density and swelling of the right hemisphere in the ACA and MCA territories with mass effect and right-to-left shift of 5-6 mm. No hemorrhagic transformation at this time.   Ct Head Wo Contrast 07/20/2019 0444 1. Acute right ACA and MCA territory infarct with progressive swelling that has decompressed through the craniectomy defect. 2. No acute hemorrhage or new infarction.   Ct Angio Head W Or Wo Contrast Ct Angio Neck W  Or Wo Contrast 07/21/2019 1. Stable from prior MRA. 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 No evidence of atherosclerosis or vasculopathy in the other vessels. 2. Cytotoxic edema causes 5 mm of midline shift and brain bulging through the craniectomy defect. Mild petechial hemorrhage is seen at the basal ganglia.   Ct Head Wo Contrast 07/23/2019 1. Unchanged appearance of massive right MCA and ACA territory infarcts with parenchyma extending through decompressive craniectomy. 2. Small focus of suspected hemorrhage adjacent to the right caudate head.  07/30/2019 1. Decreasing mass effect within large right anterior MCA and ACA territory infarct. 2. Midline shift is no longer present. There is decreased effacement of the right lateral ventricle. 3. Infarcted brain tissue still herniates through the craniectomy site. 4. New parenchymal hemorrhage within the infarcted tissue anteriorly measures 2.5 x 2.3 x 2.7 cm. 5. No new  infarct.  08/01/2019  1. Stable head CT since 07/30/2019. 2. Again noted is a large infarct involving the right MCA and right ACA territories with brain tissue herniating through the right craniotomy defect. 3. Parenchymal hemorrhage in the right anterior cortex has minimally changed. Stable petechial hemorrhage in the right basal ganglia region.   08/11/2019 1. New patchy parenchymal hemorrhages along the periphery of the infarcted RIGHT frontoparietal lobe, compatible with hemorrhagic conversion, largest focus underlying the RIGHT frontal bone measures 1.3 cm greatest dimension. 2. Previously described dominant focus of hemorrhage along the anterior margin of the craniectomy site has decreased in size and density compared to previous exams, compatible with expected evolution. 3. Stable herniation of infarcted brain through the RIGHT frontal-parietal-temporal craniectomy site. 4. No additional mass effect or midline shift on today's exam.   Ct Angio Chest Pe W Or Wo Contrast 08/01/2019 Nonocclusive thrombus seen within the left posterior lower lobe segmental artery.    [place] ABD Limited 08/11/2019 Suspected mildly complex seroma versus liquified hematoma in the superficial soft tissues of the RIGHT abdomen, measuring 7.9 cardiomyopathy greatest dimension, surrounding patient's calvarium (location of patient's skull status post partial craniectomy), appearance less suggestive of abscess. The collection seen today by ultrasound is similar to the appearance of the collection on earlier CT abdomen of 08/04/2019.  2D Echocardiogram 07/20/2019  1. The left ventricle has normal systolic function with an ejection fraction of 60-65%. The cavity size was normal. Left ventricular diastolic parameters were normal.  2. The right ventricle has normal systolic function. The cavity was normal. There is no increase in right ventricular wall thickness.  3. The pericardial effusion is circumferential.  4. Trivial  pericardial effusion is present.  5. The mitral valve is grossly normal.  6. The tricuspid valve is grossly normal.  7. The aortic valve is tricuspid. No stenosis of the aortic valve.  8. The aorta is normal unless otherwise noted.  9. The aortic root is normal in size and structure. 10. No cardiac source of embolism identified. 11. When compared to the prior study: No comparison.  LE Dopplers 08/02/2019 Right: Findings consistent with acute deep vein thrombosis involving the right peroneal veins.  Left: Findings consistent with acute deep vein thrombosis involving the left common femoral vein, left femoral vein, left proximal profunda vein, left popliteal vein, left posterior tibial veins, and left peroneal veins. Extending up into left iliac vein  and IVC. 07/21/2019 Right: Findings consistent with acute deep vein thrombosis involving the right peroneal veins. Left: Findings consistent with acute deep vein thrombosis involving the left popliteal  vein, left posterior tibial veins, and left peroneal veins.  Vas [place] Transcranial Doppler W Bubbles 07/25/2019 No HITS heard heard at rest. No HITS heard heard during valsalva.   2D echo w/bubble 08/03/2019 Limited bubble study for shunt. No evidence for atrial level right to left shunt.  Ct Abdomen Pelvis W Contrast 08/04/2019 1. Large fluid collection in the lower right anterior abdominal wall subcutaneous fat with what appears to be an internal surgical drain, favored to represent a postoperative hematoma or proteinaceous seroma, however, the possibility of an abscess is not excluded in light of the patient's fever.  2. Small high attenuation fluid collection along the left pelvic sidewall which likely represents a hematoma, as above.  3. Additional incidental findings, as above.  Ct Head Wo Contrast 08/14/2019 IMPRESSION:  1. Significant increase in the amount of acute hemorrhage along the periphery of patient's infarcted RIGHT hemisphere, most  prominently developed adjacent to the posterior margin of the craniectomy site. There is associated increase in mass effect causing increased herniation through the craniectomy defect. The mass effect is also causing increased compression of the RIGHT lateral ventricle.  2. New intraventricular extension of the acute hemorrhage.  3. No appreciable change of the mild leftward midline shift. No evidence of tonsillar herniation.   CT Head WO Contrast 08/15/19 IMPRESSION: 1. Postoperative changes from prior right craniectomy with extensive mass effect and herniation of the infarcted brain through the craniectomy defect, similar to previous. 2. Associated hemorrhagic transformation within the infarcted right cerebral hemisphere with dominant hematoma measuring up to 10.1 x 5.5 x 5.9 cm, slightly worsened from previous. Associated intraventricular extension and mass effect on the right lateral ventricle without worsening hydrocephalus or ventricular trapping, similar to previous. 3. No other new acute intracranial abnormality.  Ct Head Wo Contrast  08/16/2019 1. Hemorrhagic right ACA and MCA territory infarcts with swollen brain bulging through the craniectomy defect. Intraventricular hemorrhage with ventriculomegaly. No change from 2 days ago.     CXR 08/17/2019 No significant change in AP portable examination. No acute airspace Opacity.  Ct Head Wo Contrast  08/20/2019 IMPRESSION: 1. No significant interval change since previous exam from 08/18/2019. 2. Continued interval evolution of hemorrhagic right ACA and MCA territory infarcts with extensive edema and herniation through the right craniectomy defect. 3. Irregular collections about the craniectomy site with question of disruption of the underlying dura, which could reflect sequelae of underlying CSF leak. Correlation with physical exam and fluid sampling suggested for further evaluation. 4. No significant interval change in intraventricular  hemorrhage. 5. Similar 6 mm left-to-right shift related to herniation. Basilar cisterns remain patent. 6. No other new acute intracranial abnormality.   MRI Brain W &  WO Contrast 08/20/19 IMPRESSION: Right hemispheric infarction.  Decompressive craniectomy with herniation of brain into the craniectomy site.  Overlying subgaleal fluid collection on the right. Large irregularly enhancing fluid collection along the anterior margin of the craniectomy measuring approximately 5.4 x 9.8 cm. This may represent a large abscess. Large area of hemorrhage in the right temporoparietal infarct also shows peripheral enhancement. This process may be related to sterile hematoma versus infected hematoma. Mild ventricular enlargement, stable  These results were called by telephone at the time of interpretation on 08/20/2019 at 11:35 am to provider Kathyrn Sheriff MD, who verbally acknowledged these results.   PHYSICAL EXAM General- Well nourished, well developed, middle-aged African-American male. He has right hemicraniectomy surgical incision on the scalp, bulging.  Right head bulging from crani site.  Ophthalmologic- fundi not  visualized due to noncooperation.  Cardiovascular - Regular rhythm and rate.  Neuro - patient with open eyes. Nonverbal, Not following commands as well as prior but moves right arm to voice command consistently. Eyes in right gaze position, does not cross midline, not blinking to visual threat to the left, PERRL. Left facial droop. Tongue protrusion not corporative. Has spontaneous movement of RUE against gravity and RLE in bed 3/5 and moving spontaneously. On pain stimulation, mild withdraw of LLE, but no movement of LUE. DTR 1+ and toes equiv. Sensation, coordination and gait not tested.   ASSESSMENT/PLAN [name] is a 43 y.o. male with no significant past medical history found down x 2 days nonverbal with L hemiparesis.   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 Trace L midline shift. ELVO at R ICA, R A1, R MCA.  MRI  Large R brain infarct sparing R PCA. Cytotoxic edema but no hemorrhage. Stable trace midline shift.  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 R ICA terminus, R ACA, R MCA occluded.   CT head similar w/ low density and sweddling R ACA and MCA territories, now with 5-56mm midline shift. no hemorrhage  CT head acute R ACA and MCA infarct w/ progressive swelling decompressed through craniectomy defect.   CTA head and neck stable MLS. R ICA occlusion in neck that continues into R ACA and MCA. Mild petechial hemorrhage at basal ganglia  CT Head 8/29 - Unchanged appearance of massive right MCA and ACA territory infarcts with parenchyma extending through decompressive craniectomy. Small focus of suspected hemorrhage adjacent to the right caudate head.  CT head 07/30/19 - New hematoma within the infarcted tissue   CT head 08/01/2019 stable w/o change  CT head 08/11/19 - new patchy hemorrhage along periphery of infarcted R frontoparietal lobe (likely HT) decreased dominant anterior margin hemorrhage. Stable herniation of infarcted brain through crani site.   CT head 9/20 - worsening with increased HT on the right and new IVH  CT head 08/15/19 - slightly worsened HT from previous.   CT head 9/22 hemorrhagic R ACA and MCA territory infarcts with edematous brain bulging through Crani defect.  No change x2 days  CT head 9/24 stable hematoma, increased right hemisphere mass-effect, external herniation from Crani site, making right lateral ventriculomegaly than prior images.  2D Echo EF 60-65%  2D echo w/ bubble ordered no LRS   LE venous doppler DVT in R peroneal, L popliteal, L posterior tibial and L  peroneal  LE venous doppler repeat DVT in R peroneal, L popliteal, L posterior tibial and L peroneal  TCD bubble no HITS, no PFO  LDL 83  HgbA1c 5.4  UDS positive THC  Hypercoagulable and autoimmune work up negative  SCDs for VTE prophylaxis  No antithrombotic prior to admission, treated DVT with heparin IV, then  discontinued due to hemorrhagic conversion with right frontal HT, then resumed heparin IV given extensive DVT and PE, but now heparin IV again discontinued due to worsening HT  Therapy recommendations:  SNF (reassessed by CIR and declined 9/16)  Disposition:  pending   Cyctotoxic cerebral edema  S/p R decompressive hemicraniectomy (Nundkumar) w/ flap R abd 07/19/2019  PICC placed - keep for  now per Leonie Man  Given One dose of 23.4% 8/26; 3%  off 8/30 1700  CT repeat 07/30/19 - New parenchymal hemorrhage within the infarcted tissue  Off helmet to further release pressure  CT repeat 08/01/2019 stable w/o change  CT head repeat 08/11/19 - new patchy hemorrhage along periphery of infarcted R frontoparietal lobe (likely HT) but previous right frontal hematoma now much reduced in size  CT head 9/20 worsening right HT with new ICH and IVH  CT Head 9/21 - slightly worsened HT from previous.   CT head 9/22 hemorrhagic R ACA and MCA territory infarcts with edematous brain bulging through Crani defect.  No change x2 days  CT head 9/24 stable hematoma, increased right hemisphere mass-effect, external herniation from Crani site, making right lateral ventriculomegaly than prior images. - discussed with Dr. Kathyrn Sheriff, no need of any intervention, continue to monitor  Monitor Na 141->139->138  Right ICA occlusion  MRA and CTA showed right ICA occlusion from origin to terminal  Wife denies any head trauma  Wife stated that pt had recent aggressive exercise with weight lifting  Concerning dissection as working diagnosis of stroke etiology   B LE DVT Small LLL PE  LE venous  doppler DVT in R peroneal, L popliteal, L posterior tibial and L peroneal  Etiology unclear  Started IV heparin 07/26/2019  IVC filter placed 8/27. Plan retrieval in 8-12 weeks as an IP in an IR clinic  Hypercoagulable work up negative   Off SCDs  LE venous doppler 08/02/19 repeat extensive DVT in R peroneal, L popliteal, L posterior tibial and L peroneal up to the IVC filter  CT chest Nonocclusive thrombus seen within the left posterior lower lobe segmental artery.   Treated with IV Heparin but then off secondary to intracranial hemorrhage, then restarted IV heparin given extensive DVT and PE but again stopped due to worsening HT on 9/20  Hematology consult, appreciate help - hypercoag w/u neg.  At risk for recurrent clots   Factor V Leiden, homocysteine, Prot C&S, Prothombin gene mutation all neg.   Abdominal wall hematoma  Large fluid collection seen on CT abdomen  CT abd/pel concerning for abdominal wall fluid collection likely old hematoma  Dr. Kathyrn Sheriff did fluid aspiration and seems to be old blood component  Fluid aspiration sent for culture 9/11 - no growth < 24 hrs  Repeat ABD [place] complex seroma vs liquified hematoma R abd similar to previous  Seizure-like activity  EEG continuous slowing, excessive beta activity, sleep spindle asymmetry decreased R->related to stroke and sedation. No SZ  Long Term EEG cortical dysfunction in right frontal region  Seizure precautions  Continue Keppra   SIRS/fever/leukocytosis - absorbing fever due to necrotic tissue most likely  Tmax - 101.7->afebrile -> afebrile ->100.9->102.5->101.1->102.8->103.7->100.7  WBC - 10.7->10.0->9.1->10.9->17.2->16.6->12.5->10.0  CXR x 3 NAD  Blood cultures - neg, repeated again 08/04/19 -> no growth on empiric unasyn -> changed to vanco, cefepime and flagyl 08/05/19->now off  Lactic acid 1.2->2.0  Hepatitis panel 9/12 - neg  procalcitonin and lactic acid normal   Continue baclofen 10 mg tid,  bromocriptine 5 mg q6h, and 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 Lynetta Mare - no new recommendations  UTI  Urine culture pseudomonas - sensitive to zosyn  UA 9/21 - WBC 0-5 negative   UA 9/23 - WBC 11-20  Repeat B Cx no growth 1 day  On zosyn now x 5d (end date set in order)  Tachycardia, resolved  HR normal now  Likely due to fever  Hydration with IVF, TF and free water  On metoprolol to 50 Q8h  EKG ST, rate 120 w/ possible LA enlargement  Troponin series neg x 3    Hyperlipidemia  Home meds:  no statin  LDL 83, goal < 70  Was on lipitor 20 mg daily   D/c statin due to elevated LFT  Elevated LFT, almost resolved  AST - 105-118-98-85-38  ALT - [phone]  CK 2063-852 -[id number]  Hepatitis panel 9/12 - neg  Dysphagia . Secondary to stroke . Dysphagia 1 diet with thin liquid -> NPO due to lethargy with worsening HT  Cortrak w/ TF  Add NS @ 40cc for IVF  Speech on board   Cerebral Abscess / Empyema (evaluated by Dr Rory Percy and Dr Kathyrn Sheriff)  CT 08/20/19 - Irregular collections about the craniectomy site with question of disruption of the underlying dura, which could reflect sequelae of underlying CSF leak. Correlation with physical exam and fluid sampling suggested for further evaluation.  MRI W&WO - 08/20/19 - ...herniation of brain into the craniectomy site. Large irregularly enhancing fluid collection along the anterior margin of the craniectomy measuring approximately 5.4 x 9.8 cm. This may represent a large abscess....sterile hematoma versus infected hematoma.   Plan per Dr Kathyrn Sheriff - "Percutaneous aspiration of the clearly purulent pocket anteriorly and start broad spectrum IV abx. Would also consider infectious disease consult." (Dr Kathyrn Sheriff notified Dr Tommy Medal ID)  Tmax - 101.7->afebrile -> afebrile ->100.9->102.5->101.1->102.8->103.7->100.7  WBC -  10.7->10.0->9.1->10.9->17.2->16.6->12.5->10.0  Now on Maxipime and Vancomycin - pharmacy recommends adding Flagyl - will await ID consult   Other Stroke Risk Factors  Former Cigarette smoker, quit 3 yrs ago  ETOH use  Substance abuse UDS - positive for THC   Obesity, Body mass index is 28.35 kg/m., recommend weight loss, diet and exercise as appropriate   Other Active Problems  Normocytic anemia - hematology felt d/t ongoing blood draws 10.0->9.5->10.3->10.3->10.8->10.7->10.6  Hospital day # 32  This patient is critically ill and at significant risk of neurological worsening, death and care requires constant monitoring of vital signs, hemodynamics,respiratory and cardiac monitoring,review of multiple databases, neurological assessment, discussion with family, other specialists and medical decision making of high complexity.I  I spent 30 minutes of neurocritical care time in the care of this patient.  Sarina Ill, MD [facility]  To contact Stroke Continuity provider, please refer to [email or web address] After hours, contact General Neurology

## 2019-08-20 NOTE — Progress Notes (Signed)
RN called RE: bulging crani site. Recommended contacting NSGY and repeat [facility] CTH completed and reviewed with neurorads. Shows large herniation through the crani site, HT in the area of stroke in the right hemisphere relatively stable, IVH stable. Can not  R/o a fistulous connection between the pocket of fluid at crani site and anteriorhorn of right lateral ventricle. Would defer to nsgy for management. Appreciate NSGY eval and management.  -- Amie Portland, MD [facility]

## 2019-08-20 NOTE — Progress Notes (Signed)
Neurosurgery and Neuro MD notified of patient having discharge draining from his old crani site. Drainage was seen and noted at beginning of shift minimally. Drainage has increased. Gauze dressing applied and will continue to reinforce per Neurosurgery. Tube feed turned off at 0100 per neurosurgery. CT ordered. Patient vital signs remain stable and neuro status is unchanged. RN will continue to monitor.

## 2019-08-20 NOTE — Progress Notes (Signed)
Pharmacy Antibiotic Note  Matthew Black is a 43 y.o. 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  Pharmacy has been consulted for Vancomycin and Cefepime dosing.  Plan: Vancomycin 2000 mg IV x 1, then Vancomycin 1000 mg IV every 8 hours.  Goal trough 15-20 mcg/mL.  Cefepime 2 grams IV q8hr Monitor renal function, clinical status, C&S and vanc levels  Height: 5\' 11"  (180.3 cm) Weight: 203 lb 4.2 oz (92.2 kg) IBW/kg (Calculated) : 75.3  Temp (24hrs), Avg:101.3 F (38.5 C), Min:99.3 F (37.4 C), Max:103.1 F (39.5 C)  Recent Labs  Lab 08/16/19 0422 08/17/19 0500 08/18/19 0435 08/19/19 0517 08/20/19 0411  WBC 10.9* 17.1* 16.6* 12.5* 10.0  CREATININE 0.83 0.90 1.07 1.07 0.93  LATICACIDVEN  --   --  0.7  --   --     Estimated Creatinine Clearance: 121.4 mL/min (by C-G formula based on SCr of 0.93 mg/dL).    Allergies  Allergen Reactions  . Hydrocodone Nausea Only    Antimicrobials this admission: Zosyn 9/21>>9/26 Vanc 9/26 >>  Cefepime 9/26 >>   Thank you for allowing pharmacy to be a part of this patient's care.  Alanda Slim, PharmD, [facility] Clinical Pharmacist Please see AMION for all Pharmacists' Contact Phone Numbers 08/20/2019, 1:54 PM

## 2019-08-20 NOTE — Progress Notes (Signed)
Notified earlier today that pt was noted to have significant purulent drainage from anterior aspect of cranial wound. MRI was ordered to further evaluate for underlying brain abscess/empyema.  I have personally reviewed the MRI with my partners as well as radiology. There is an abscess immediately subjacent to the scalp anteriorly with peripheral contrast enhancement. While tissue planes are quite distorted, it does appear on both CT, T2, and T1 non-contrast images that the majority of extracranial herniated tissue is in fact brain rather than pus. Enhancement pattern of the right frontal and parietal regions suggest an underlying cerebritis. Complicating matters is the right temporo-occipital hematoma which now appears to demonstrate some peripheral contrast enhancement. In addition, there is diffuse right hemispheric edema. Thankfully there is no significant midline shift to the contralateral side due to the craniectomy.  After discussing this case with my partner, we both feel that more radical open operative debridement of the surgical site will almost certainly lead to significant brain herniation requiring resection of large portions of the right hemisphere in order to effect closure. I think this carries a significant risk of contralateral injury/subdural and/or brainstem shift. I therefore think it safer to attempt a more percutaneous aspiration of the clearly purulent pocket anteriorly and start broad spectrum IV abx. Would also consider infectious disease consult.

## 2019-08-20 NOTE — Progress Notes (Signed)
  NEUROSURGERY PROGRESS NOTE   Called in regards to patient. Patient is s/p right craniectomy 8/25 for CVA, recently admitted for fever who now has copious purulent discharge from [place] site. T max 103.7. Reviewed with Dr Kathyrn Sheriff. Will need MRI brain to assess for any underlying brain abscess vs superficial prior to deciding about taking to the OR.  Ferne Reus, PA-C [facility] [employer]

## 2019-08-21 DIAGNOSIS — I6789 Other cerebrovascular disease: Secondary | ICD-10-CM

## 2019-08-21 DIAGNOSIS — Z86711 Personal history of pulmonary embolism: Secondary | ICD-10-CM

## 2019-08-21 DIAGNOSIS — G0490 Encephalitis and encephalomyelitis, unspecified: Secondary | ICD-10-CM

## 2019-08-21 DIAGNOSIS — Z930 Tracheostomy status: Secondary | ICD-10-CM

## 2019-08-21 DIAGNOSIS — I69354 Hemiplegia and hemiparesis following cerebral infarction affecting left non-dominant side: Secondary | ICD-10-CM

## 2019-08-21 LAB — GLUCOSE, CAPILLARY
Glucose-Capillary: 100 mg/dL — ABNORMAL HIGH (ref 70–99)
Glucose-Capillary: 107 mg/dL — ABNORMAL HIGH (ref 70–99)
Glucose-Capillary: 111 mg/dL — ABNORMAL HIGH (ref 70–99)
Glucose-Capillary: 111 mg/dL — ABNORMAL HIGH (ref 70–99)
Glucose-Capillary: 123 mg/dL — ABNORMAL HIGH (ref 70–99)
Glucose-Capillary: 124 mg/dL — ABNORMAL HIGH (ref 70–99)

## 2019-08-21 LAB — SURGICAL PCR SCREEN
MRSA, PCR: NEGATIVE
Staphylococcus aureus: POSITIVE — AB

## 2019-08-21 MED ORDER — MUPIROCIN 2 % EX OINT
1.0000 "application " | TOPICAL_OINTMENT | Freq: Two times a day (BID) | CUTANEOUS | Status: AC
  Administered 2019-08-21 – 2019-08-25 (×10): 1 via NASAL
  Filled 2019-08-21 (×2): qty 22

## 2019-08-21 MED ORDER — CHLORHEXIDINE GLUCONATE CLOTH 2 % EX PADS
6.0000 | MEDICATED_PAD | Freq: Every day | CUTANEOUS | Status: AC
  Administered 2019-08-21 – 2019-08-25 (×5): 6 via TOPICAL

## 2019-08-21 NOTE — Progress Notes (Signed)
Subjective: unresponsive   Antibiotics:  Anti-infectives (From admission, onward)   Start     Dose/Rate Route Frequency Ordered Stop   08/20/19 2200  vancomycin (VANCOCIN) IVPB 1000 mg/200 mL premix     1,000 mg 200 mL/hr over 60 Minutes Intravenous Every 8 hours 08/20/19 1348     08/20/19 1800  ceFEPIme (MAXIPIME) 2 g in sodium chloride 0.9 % 100 mL IVPB     2 g 200 mL/hr over 30 Minutes Intravenous Every 8 hours 08/20/19 1717     08/20/19 1400  vancomycin (VANCOCIN) 2,000 mg in sodium chloride 0.9 % 500 mL IVPB     2,000 mg 250 mL/hr over 120 Minutes Intravenous  Once 08/20/19 1348 08/20/19 1718   08/20/19 1400  ceFEPIme (MAXIPIME) 2 g in sodium chloride 0.9 % 100 mL IVPB  Status:  Discontinued     2 g 200 mL/hr over 30 Minutes Intravenous Every 8 hours 08/20/19 1348 08/20/19 1717   08/15/19 1100  piperacillin-tazobactam (ZOSYN) IVPB 3.375 g     3.375 g 12.5 mL/hr over 240 Minutes Intravenous Every 8 hours 08/15/19 1029 08/20/19 1200   08/05/19 1000  vancomycin (VANCOCIN) 1,250 mg in sodium chloride 0.9 % 250 mL IVPB  Status:  Discontinued     1,250 mg 166.7 mL/hr over 90 Minutes Intravenous Every 12 hours 08/04/19 1856 08/08/19 0914   08/05/19 0600  ceFEPIme (MAXIPIME) 2 g in sodium chloride 0.9 % 100 mL IVPB  Status:  Discontinued     2 g 200 mL/hr over 30 Minutes Intravenous Every 8 hours 08/04/19 1856 08/08/19 0914   08/04/19 2200  metroNIDAZOLE (FLAGYL) IVPB 500 mg  Status:  Discontinued     500 mg 100 mL/hr over 60 Minutes Intravenous Every 8 hours 08/04/19 1743 08/06/19 1548   08/04/19 1830  vancomycin (VANCOCIN) 2,000 mg in sodium chloride 0.9 % 500 mL IVPB     2,000 mg 250 mL/hr over 120 Minutes Intravenous  Once 08/04/19 1805 08/04/19 2053   08/04/19 1815  ceFEPIme (MAXIPIME) 2 g in sodium chloride 0.9 % 100 mL IVPB     2 g 200 mL/hr over 30 Minutes Intravenous  Once 08/04/19 1805 08/04/19 1842   07/31/19 2200  Ampicillin-Sulbactam (UNASYN) 3 g in sodium  chloride 0.9 % 100 mL IVPB  Status:  Discontinued     3 g 200 mL/hr over 30 Minutes Intravenous Every 6 hours 07/31/19 1401 08/04/19 1743   07/31/19 1415  Ampicillin-Sulbactam (UNASYN) 3 g in sodium chloride 0.9 % 100 mL IVPB     3 g 200 mL/hr over 30 Minutes Intravenous STAT 07/31/19 1401 07/31/19 1551   07/19/19 2315  bacitracin 50,000 Units in sodium chloride 0.9 % 500 mL irrigation  Status:  Discontinued       As needed 07/19/19 2315 07/19/19 2339      Medications: Scheduled Meds:  acetaminophen  650 mg Oral Q4H   Or   acetaminophen (TYLENOL) oral liquid 160 mg/5 mL  650 mg Per Tube Q4H   Or   acetaminophen  650 mg Rectal Q4H   baclofen  10 mg Per Tube TID   bromocriptine  5 mg Per Tube Q6H   busPIRone  30 mg Per Tube TID   chlorhexidine  15 mL Mouth Rinse BID   Chlorhexidine Gluconate Cloth  6 each Topical Daily   Chlorhexidine Gluconate Cloth  6 each Topical Daily   famotidine  20 mg Per Tube BID  feeding supplement (PRO-STAT SUGAR FREE 64)  30 mL Per Tube TID   levETIRAcetam  500 mg Per Tube BID   mouth rinse  15 mL Mouth Rinse q12n4p   metoprolol tartrate  50 mg Per Tube Q8H   mupirocin ointment  1 application Nasal BID   sodium chloride flush  10-40 mL Intracatheter Q12H   Continuous Infusions:  sodium chloride 40 mL/hr at 08/21/19 0800   sodium chloride Stopped (08/21/19 0643)   sodium chloride Stopped (08/21/19 0434)   ceFEPime (MAXIPIME) IV 2 g (08/21/19 0925)   feeding supplement (OSMOLITE 1.5 CAL) 1,000 mL (08/20/19 1724)   vancomycin Stopped (08/21/19 0606)   PRN Meds:.sodium chloride, sodium chloride, labetalol, ondansetron (ZOFRAN) IV, senna-docusate, sodium chloride flush, traMADol    Objective: Weight change: -2.1 kg  Intake/Output Summary (Last 24 hours) at 08/21/2019 1205 Last data filed at 08/21/2019 0800 Gross per 24 hour  Intake 2994.8 ml  Output 1550 ml  Net 1444.8 ml   Blood pressure 125/89, pulse 86, temperature  99.7 F (37.6 C), temperature source Axillary, resp. rate (!) 22, height 5\' 11"  (1.803 m), weight 90.1 kg, SpO2 100 %. Temp:  [97.9 F (36.6 C)-101.4 F (38.6 C)] 99.7 F (37.6 C) (09/27 0800) Pulse Rate:  [86-111] 86 (09/27 0800) Resp:  [14-22] 22 (09/27 0800) BP: (107-134)/(67-93) 125/89 (09/27 0800) SpO2:  [98 %-100 %] 100 % (09/27 0800) Weight:  [90.1 kg] 90.1 kg (09/27 0153)  Physical Exam: General: Alert and awake, unresponsive with tracheostomy tracheostomy HEENT: anicteric sclera, EOMI CVS tachycardic no murmurs gallops or rubs Chest: Fairly clear to auscultation bilaterally abdomen: soft non-distended,  Extremities: no edema or deformity noted bilaterally Skin: no rashes Neuro: Left-sided neglect and weakness, moves right upper extremity and lower extremity spontaneously  CBC:    BMET Recent Labs    08/19/19 0517 08/20/19 0411  NA 140 143  K 3.8 3.5  CL 105 112*  CO2 25 21*  GLUCOSE 124* 111*  BUN 19 18  CREATININE 1.07 0.93  CALCIUM 8.8* 8.4*     Liver Panel  No results for input(s): PROT, ALBUMIN, AST, ALT, ALKPHOS, BILITOT, BILIDIR, IBILI in the last 72 hours.     Sedimentation Rate No results for input(s): ESRSEDRATE in the last 72 hours. C-Reactive Protein No results for input(s): CRP in the last 72 hours.  Micro Results: Recent Results (from the past 720 hour(s))  Culture, blood (routine x 2)     Status: None   Collection Time: 07/30/19  1:09 PM   Specimen: BLOOD LEFT ARM  Result Value Ref Range Status   Specimen Description BLOOD LEFT ARM  Final   Special Requests   Final    BOTTLES DRAWN AEROBIC AND ANAEROBIC Blood Culture adequate volume   Culture   Final    NO GROWTH 5 DAYS Performed at [facility], [address] [place], [place] [address]    Report Status 08/08/2019 FINAL  Final  Culture, blood (routine x 2)     Status: None   Collection Time: 07/30/19  1:09 PM   Specimen: BLOOD RIGHT HAND  Result Value Ref Range  Status   Specimen Description BLOOD RIGHT HAND  Final   Special Requests   Final    BOTTLES DRAWN AEROBIC ONLY Blood Culture results may not be optimal due to an inadequate volume of blood received in culture bottles   Culture   Final    NO GROWTH 5 DAYS Performed at [facility],  [address], [place], [place] [address]    Report Status 08/08/2019 FINAL  Final  Culture, blood (Routine X 2) w Reflex to ID Panel     Status: None   Collection Time: 08/01/19  4:50 AM   Specimen: BLOOD  Result Value Ref Range Status   Specimen Description BLOOD PICC LINE  Final   Special Requests   Final    BOTTLES DRAWN AEROBIC ONLY Blood Culture adequate volume   Culture   Final    NO GROWTH 5 DAYS Performed at [facility], [place] [address], [place], [place] [address]    Report Status 08/06/2019 FINAL  Final  Culture, blood (Routine X 2) w Reflex to ID Panel     Status: None   Collection Time: 08/01/19  4:50 AM   Specimen: BLOOD RIGHT HAND  Result Value Ref Range Status   Specimen Description BLOOD RIGHT HAND  Final   Special Requests   Final    BOTTLES DRAWN AEROBIC ONLY Blood Culture adequate volume   Culture   Final    NO GROWTH 5 DAYS Performed at [facility], [place] [address] [place], [place] [address]    Report Status 08/06/2019 FINAL  Final  Culture, Urine     Status: None   Collection Time: 08/04/19 12:48 PM   Specimen: Urine, Random  Result Value Ref Range Status   Specimen Description URINE, RANDOM  Final   Special Requests NONE  Final   Culture   Final    NO GROWTH Performed at [facility], [place] [address], [place], [place] [address]    Report Status 08/05/2019 FINAL  Final  Culture, blood (routine x 2)     Status: None   Collection Time: 08/04/19  6:59 PM   Specimen: BLOOD LEFT ARM  Result Value Ref Range Status   Specimen Description BLOOD LEFT ARM  Final   Special Requests   Final    BOTTLES DRAWN AEROBIC ONLY Blood Culture results may not be  optimal due to an inadequate volume of blood received in culture bottles   Culture   Final    NO GROWTH 5 DAYS Performed at [facility], [place] [address], [place], [place] [address]    Report Status 08/09/2019 FINAL  Final  Culture, blood (routine x 2)     Status: None   Collection Time: 08/04/19  6:59 PM   Specimen: BLOOD LEFT HAND  Result Value Ref Range Status   Specimen Description BLOOD LEFT HAND  Final   Special Requests   Final    BOTTLES DRAWN AEROBIC ONLY Blood Culture results may not be optimal due to an inadequate volume of blood received in culture bottles   Culture   Final    NO GROWTH 5 DAYS Performed at [facility], [place] [address], [place], [place] [address]    Report Status 08/09/2019 FINAL  Final  Body fluid culture     Status: None   Collection Time: 08/05/19 11:33 AM   Specimen: Body Fluid  Result Value Ref Range Status   Specimen Description FLUID ABDOMEN  Final   Special Requests NONE  Final   Gram Stain   Final    RARE WBC PRESENT,BOTH PMN AND MONONUCLEAR NO ORGANISMS SEEN    Culture   Final    NO GROWTH 3 DAYS Performed at [facility], [address] [place], [place] [address]    Report Status 08/08/2019 FINAL  Final  Culture, blood (routine x 2)     Status: None   Collection Time: 08/06/19  4:30 PM   Specimen: BLOOD  Result Value Ref Range Status   Specimen Description BLOOD LEFT ANTECUBITAL  Final   Special Requests AEROBIC BOTTLE ONLY Blood Culture adequate volume  Final   Culture   Final    NO GROWTH 5 DAYS Performed at [facility], [address] [place], [place] [address]    Report Status 08/11/2019 FINAL  Final  Culture, blood (routine x 2)     Status: None   Collection Time: 08/06/19  4:42 PM   Specimen: BLOOD LEFT HAND  Result Value Ref Range Status   Specimen Description BLOOD LEFT HAND  Final   Special Requests NONE  Final   Culture   Final    NO GROWTH 5 DAYS Performed at [facility], [place] [address] [place] [address] [place], [place] [address]    Report Status 08/11/2019 FINAL  Final  Culture, Urine     Status: Abnormal   Collection Time: 08/13/19  4:27 PM   Specimen: Urine, Clean Catch  Result Value Ref Range Status   Specimen Description URINE, CLEAN CATCH  Final   Special Requests   Final    NONE Performed at [facility], [place] [address] [place] [address], [place], [place] [address]    Culture >=100,000 COLONIES/mL PSEUDOMONAS AERUGINOSA (A)  Final   Report Status 08/15/2019 FINAL  Final   Organism ID, Bacteria PSEUDOMONAS AERUGINOSA (A)  Final      Susceptibility   Pseudomonas aeruginosa - MIC*    CEFTAZIDIME 16 INTERMEDIATE Intermediate     CIPROFLOXACIN 1 SENSITIVE Sensitive     GENTAMICIN <=1 SENSITIVE Sensitive     IMIPENEM 2 SENSITIVE Sensitive     PIP/TAZO 32 SENSITIVE Sensitive     CEFEPIME 16 INTERMEDIATE Intermediate     * >=100,000 COLONIES/mL PSEUDOMONAS AERUGINOSA  Culture, blood (Routine X 2) w Reflex to ID Panel     Status: None (Preliminary result)   Collection Time: 08/17/19 10:19 AM   Specimen: BLOOD  Result Value Ref Range Status   Specimen Description BLOOD LEFT ANTECUBITAL  Final   Special Requests   Final    BOTTLES DRAWN AEROBIC ONLY Blood Culture adequate volume   Culture   Final    NO GROWTH 4 DAYS Performed at [facility], [address] [place], [place] [address]    Report Status PENDING  Incomplete  Culture, blood (Routine X 2) w Reflex to ID Panel     Status: None (Preliminary result)   Collection Time: 08/17/19 10:27 AM   Specimen: BLOOD LEFT HAND  Result Value Ref Range Status   Specimen Description BLOOD LEFT HAND  Final   Special Requests   Final    BOTTLES DRAWN AEROBIC ONLY Blood Culture results may not be optimal due to an inadequate volume of blood received in culture bottles   Culture   Final    NO GROWTH 4 DAYS Performed at [facility], [place] [address] [place],  [address]    Report Status PENDING  Incomplete  Surgical PCR  screen     Status: Abnormal   Collection Time: 08/21/19  1:50 AM   Specimen: Nasal Mucosa; Nasal Swab  Result Value Ref Range Status   MRSA, PCR NEGATIVE NEGATIVE Final   Staphylococcus aureus POSITIVE (A) NEGATIVE Final    Comment: CRITICAL RESULT CALLED TO, READ BACK BY AND  VERIFIED WITH: RNBrayton Layman ET:[id number] @0423  THANEY Performed at [facility], [place] [address] [place], [place] [address]     Studies/Results: Ct Head Wo Contrast  Result Date: 08/20/2019 CLINICAL DATA:  Follow-up examination for intracranial hemorrhage. EXAM: CT HEAD WITHOUT CONTRAST TECHNIQUE: Contiguous axial images were obtained from the base of the skull through the vertex without intravenous contrast. COMPARISON:  Prior CT from 08/18/2019. FINDINGS: Brain: Extensive evolving cytotoxic edema related to large right ACA and MCA territory infarcts again seen. Associated parenchymal swelling with herniation of the brain through the right craniectomy defect again seen. There is a 1.9 x 7.5 cm pocket of fluid and/or collection at the anterior margin of the craniectomy defect (series 3, image 23, with discontinuity of the underlying dura, similar to previous. Finding could reflect CSF leak. There is deviation of the frontal horn of the right lateral ventricle towards the craniectomy defect and this collection, which may be in communication (a series 5, image 29). Similar subgaleal fluid pocket at the posterior craniectomy margin measures 3.9 cm, also similar. These collections may be contiguous superiorly (series 3, image 33). Overall, appearance is little interval changed. Superimposed scattered intraparenchymal hemorrhage within the infarcted brain is little interval changed. Associated intraventricular extension with blood throughout the right greater than left lateral ventricles. 6 mm left-to-right shift related to herniation, similar. No hydrocephalus. Basilar cisterns remain patent. No other new acute intracranial  hemorrhage. No other acute large vessel territory infarct. Vascular: No hyperdense vessel. Skull: Previous right craniectomy. Sinuses/Orbits: Globes and orbital soft tissues demonstrate no acute finding. Paranasal sinuses remain clear. No mastoid effusion. Other: None. IMPRESSION: 1. No significant interval change since previous exam from 08/18/2019. 2. Continued interval evolution of hemorrhagic right ACA and MCA territory infarcts with extensive edema and herniation through the right craniectomy defect. 3. Irregular collections about the craniectomy site with question of disruption of the underlying dura, which could reflect sequelae of underlying CSF leak. Correlation with physical exam and fluid sampling suggested for further evaluation. 4. No significant interval change in intraventricular hemorrhage. 5. Similar 6 mm left-to-right shift related to herniation. Basilar cisterns remain patent. 6. No other new acute intracranial abnormality. Electronically Signed   By: Jeannine Boga M.D.   On: 08/20/2019 05:16   Mr Jeri Cos [id number] Contrast  Result Date: 08/20/2019 CLINICAL DATA:  Postoperative craniotomy. Stroke. Fever with drainage from craniotomy site. EXAM: MRI HEAD WITHOUT AND WITH CONTRAST TECHNIQUE: Multiplanar, multiecho pulse sequences of the brain and surrounding structures were obtained without and with intravenous contrast. CONTRAST:  30mL GADAVIST GADOBUTROL 1 MMOL/ML IV SOLN COMPARISON:  Multiple prior studies. CT head 08/20/2019. MRI head 07/19/2019 FINDINGS: Brain: Right hemispheric infarction with hemorrhage. No acute blood products on CT yesterday. Extensive edema throughout the right hemisphere. Large craniectomy defect on the right with brain herniation through the defect. Mild midline shift to the right. Mild lateral ventricle dilatation, unchanged from yesterday. Mild midline shift to the right due to craniectomy decompression. Small amount of blood in the left occipital horn. Small amount  of blood in the third ventricle. Large amount of hemorrhage in the right hemispheric infarct as noted previously. Following contrast infusion, there is a large rim enhancing fluid collection in the right frontal region along the anterior margin of the craniectomy defect. This measures approximately 5.4 x 9.8 cm and may represent abscess given the clinical presentation. Large hematoma right temporoparietal lobe also shows peripheral enhancement. This can occur with hematoma or infection. 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 Otherwise normal arterial flow voids. Skull and upper cervical spine: Large craniectomy on the right. Right hemisphere herniation of brain into the craniectomy defect. Large subgaleal fluid collection on the right unchanged. Sinuses/Orbits: Negative Other: None IMPRESSION: Right hemispheric infarction. Decompressive craniectomy with herniation of brain into the craniectomy site. Overlying subgaleal fluid collection on the right. Large irregularly enhancing fluid collection along the anterior margin of the craniectomy measuring approximately 5.4 x 9.8 cm. This may represent a large abscess. Large area of hemorrhage in the right temporoparietal infarct also shows peripheral enhancement. This process may be related to sterile hematoma versus infected hematoma. Mild ventricular enlargement, stable These results were called by telephone at the time of interpretation on 08/20/2019 at 11:35 am to provider Kathyrn Sheriff MD , who verbally acknowledged these results. Electronically Signed   By: Franchot Gallo M.D.   On: 08/20/2019 11:36      Assessment/Plan:  INTERVAL HISTORY: Dr Kathyrn Sheriff has asirated abscess at the bedside  Active Problems:   Stroke (cerebrum) ([place])   Pressure injury of skin   Acute respiratory failure with hypoxemia (HCC)   Endotracheal tube present   Deep vein thrombosis (DVT) of non-extremity vein   Hypokalemia   FUO (fever of unknown  origin)   Acute blood loss anemia   SIRS (systemic inflammatory response syndrome) (HCC)   Leukocytosis   Primary hypercoagulable state ([place])   Acute pulmonary embolism without acute cor pulmonale ([place])   Altered mental status    Matthew Black is a 43 y.o. male with history of stroke, pulmonary embolism with hemorrhagic conversion of his CVA, neurosurgery now complicated by scalp abscess and necrotic brain tissue, not a good candidate to be taken back to the operating room for neurosurgery given risk of herniation.  He is currently on vancomycin and cefepime and neurosurgery of aspirated the abscess and sent for cultures.  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 Linus Salmons to take over the service in the am.   LOS: 33 days   Alcide Evener 08/21/2019, 12:05 PM

## 2019-08-21 NOTE — Progress Notes (Signed)
RN notified of pt. + Staph PCR results. Mupirocin and CHG standing orders ordered.

## 2019-08-21 NOTE — Progress Notes (Signed)
STROKE TEAM PROGRESS NOTE   INTERVAL HISTORY Bulging crani site, NSY aware, neurohospitalist called at [place]. MRI with abscess. Discussed with NSY PA and Physician Dr. Kathyrn Sheriff, started Abx. S/p bedside aspiration this monting (9/27)   Not following commands as well as prior but moves right arm to voice command consistently.  Vitals:   08/21/19 1300 08/21/19 1400 08/21/19 1500 08/21/19 1600  BP: 120/86 (!) 115/101 (!) 125/94 121/90  Pulse:  95 95 99  Resp: (!) 22 11 15 19   Temp:    99.8 F (37.7 C)  TempSrc:    Axillary  SpO2:  100% 100% 100%  Weight:      Height:        CBC:  Recent Labs  Lab 08/15/19 0607  08/19/19 0517 08/20/19 0411  WBC 9.1   < > 12.5* 10.0  NEUTROABS 6.7  --   --   --   HGB 10.3*   < > 10.7* 10.6*  HCT 31.1*   < > 33.7* 32.1*  MCV 84.3   < > 87.5 86.3  PLT 220   < > 226 218   < > = values in this interval not displayed.    Basic Metabolic Panel:  Recent Labs  Lab 08/15/19 0607  08/19/19 0517 08/20/19 0411  NA 138   < > 140 143  K 3.8   < > 3.8 3.5  CL 105   < > 105 112*  CO2 22   < > 25 21*  GLUCOSE 130*   < > 124* 111*  BUN 15   < > 19 18  CREATININE 0.86   < > 1.07 0.93  CALCIUM 8.4*   < > 8.8* 8.4*  MG 2.1  --   --   --   PHOS 3.5  --   --   --    < > = values in this interval not displayed.    IMAGING  Ct Head Wo Contrast Ct Cervical Spine Wo Contrast  07/19/2019 1132 1. 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 2. No associated hemorrhage and mild intracranial mass effect at this time, including trace leftward midline shift. 3. Evidence of large vessel occlusion: Hyperdensity of the right ICA terminus, the right A1 and MCA. 4. Unaffected brain parenchyma appears negative. 5.  No acute traumatic injury identified in the cervical spine.   Mr Brain 73 Contrast Mr Angio Head Wo Contrast Mr Angio Neck Wo Contrast 07/19/2019 1330 1. 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 The  right ICA terminus, right ACA and MCA also appear occluded. 2. 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 Cytotoxic edema but no convincing hemorrhage. Stable mild mass effect with trace leftward midline shift. 3. Intracranial MRA is degraded by motion artifact, but no other large vessel occlusion is suspected. There appears to be lobes reperfusion of the right PCA. 4. The left hemisphere and posterior fossa brain parenchyma appears normal.   Ct Head Wo Contrast 07/19/2019 1532 Similar appearance to earlier. Low-density and swelling of the right hemisphere in the ACA and MCA territories with mass effect and right-to-left shift of 5-6 mm. No hemorrhagic transformation at this time.   Ct Head Wo Contrast 07/20/2019 0444 1. Acute right ACA and MCA territory infarct with progressive swelling that has decompressed through the craniectomy defect. 2. No acute hemorrhage or new infarction.   Ct Angio Head W Or Wo Contrast Ct Angio Neck W  Or Wo Contrast 07/21/2019 1. Stable from prior MRA. 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 No evidence of atherosclerosis or vasculopathy in the other vessels. 2. Cytotoxic edema causes 5 mm of midline shift and brain bulging through the craniectomy defect. Mild petechial hemorrhage is seen at the basal ganglia.   Ct Head Wo Contrast 07/23/2019 1. Unchanged appearance of massive right MCA and ACA territory infarcts with parenchyma extending through decompressive craniectomy. 2. Small focus of suspected hemorrhage adjacent to the right caudate head.  07/30/2019 1. Decreasing mass effect within large right anterior MCA and ACA territory infarct. 2. Midline shift is no longer present. There is decreased effacement of the right lateral ventricle. 3. Infarcted brain tissue still herniates through the craniectomy site. 4. New parenchymal hemorrhage within the infarcted tissue anteriorly measures 2.5  x 2.3 x 2.7 cm. 5. No new infarct.  08/01/2019  1. Stable head CT since 07/30/2019. 2. Again noted is a large infarct involving the right MCA and right ACA territories with brain tissue herniating through the right craniotomy defect. 3. Parenchymal hemorrhage in the right anterior cortex has minimally changed. Stable petechial hemorrhage in the right basal ganglia region.   08/11/2019 1. New patchy parenchymal hemorrhages along the periphery of the infarcted RIGHT frontoparietal lobe, compatible with hemorrhagic conversion, largest focus underlying the RIGHT frontal bone measures 1.3 cm greatest dimension. 2. Previously described dominant focus of hemorrhage along the anterior margin of the craniectomy site has decreased in size and density compared to previous exams, compatible with expected evolution. 3. Stable herniation of infarcted brain through the RIGHT frontal-parietal-temporal craniectomy site. 4. No additional mass effect or midline shift on today's exam.   Ct Angio Chest Pe W Or Wo Contrast 08/01/2019 Nonocclusive thrombus seen within the left posterior lower lobe segmental artery.    [place] ABD Limited 08/11/2019 Suspected mildly complex seroma versus liquified hematoma in the superficial soft tissues of the RIGHT abdomen, measuring 7.9 cardiomyopathy greatest dimension, surrounding patient's calvarium (location of patient's skull status post partial craniectomy), appearance less suggestive of abscess. The collection seen today by ultrasound is similar to the appearance of the collection on earlier CT abdomen of 08/04/2019.  2D Echocardiogram 07/20/2019  1. The left ventricle has normal systolic function with an ejection fraction of 60-65%. The cavity size was normal. Left ventricular diastolic parameters were normal.  2. The right ventricle has normal systolic function. The cavity was normal. There is no increase in right ventricular wall thickness.  3. The pericardial effusion is  circumferential.  4. Trivial pericardial effusion is present.  5. The mitral valve is grossly normal.  6. The tricuspid valve is grossly normal.  7. The aortic valve is tricuspid. No stenosis of the aortic valve.  8. The aorta is normal unless otherwise noted.  9. The aortic root is normal in size and structure. 10. No cardiac source of embolism identified. 11. When compared to the prior study: No comparison.  LE Dopplers 08/02/2019 Right: Findings consistent with acute deep vein thrombosis involving the right peroneal veins.  Left: Findings consistent with acute deep vein thrombosis involving the left common femoral vein, left femoral vein, left proximal profunda vein, left popliteal vein, left posterior tibial veins, and left peroneal veins. Extending up into left iliac vein  and IVC. 07/21/2019 Right: Findings consistent with acute deep vein thrombosis involving the right peroneal veins. Left: Findings consistent with acute deep vein thrombosis involving the left popliteal  vein, left posterior tibial veins, and left peroneal veins.  Vas [place] Transcranial Doppler W Bubbles 07/25/2019 No HITS heard heard at rest. No HITS heard heard during valsalva.   2D echo w/bubble 08/03/2019 Limited bubble study for shunt. No evidence for atrial level right to left shunt.  Ct Abdomen Pelvis W Contrast 08/04/2019 1. Large fluid collection in the lower right anterior abdominal wall subcutaneous fat with what appears to be an internal surgical drain, favored to represent a postoperative hematoma or proteinaceous seroma, however, the possibility of an abscess is not excluded in light of the patient's fever.  2. Small high attenuation fluid collection along the left pelvic sidewall which likely represents a hematoma, as above.  3. Additional incidental findings, as above.  Ct Head Wo Contrast 08/14/2019 IMPRESSION:  1. Significant increase in the amount of acute hemorrhage along the periphery of patient's  infarcted RIGHT hemisphere, most prominently developed adjacent to the posterior margin of the craniectomy site. There is associated increase in mass effect causing increased herniation through the craniectomy defect. The mass effect is also causing increased compression of the RIGHT lateral ventricle.  2. New intraventricular extension of the acute hemorrhage.  3. No appreciable change of the mild leftward midline shift. No evidence of tonsillar herniation.   CT Head WO Contrast 08/15/19 IMPRESSION: 1. Postoperative changes from prior right craniectomy with extensive mass effect and herniation of the infarcted brain through the craniectomy defect, similar to previous. 2. Associated hemorrhagic transformation within the infarcted right cerebral hemisphere with dominant hematoma measuring up to 10.1 x 5.5 x 5.9 cm, slightly worsened from previous. Associated intraventricular extension and mass effect on the right lateral ventricle without worsening hydrocephalus or ventricular trapping, similar to previous. 3. No other new acute intracranial abnormality.  Ct Head Wo Contrast  08/16/2019 1. Hemorrhagic right ACA and MCA territory infarcts with swollen brain bulging through the craniectomy defect. Intraventricular hemorrhage with ventriculomegaly. No change from 2 days ago.     CXR 08/17/2019 No significant change in AP portable examination. No acute airspace Opacity.  Ct Head Wo Contrast  08/20/2019 IMPRESSION: 1. No significant interval change since previous exam from 08/18/2019. 2. Continued interval evolution of hemorrhagic right ACA and MCA territory infarcts with extensive edema and herniation through the right craniectomy defect. 3. Irregular collections about the craniectomy site with question of disruption of the underlying dura, which could reflect sequelae of underlying CSF leak. Correlation with physical exam and fluid sampling suggested for further evaluation. 4. No significant  interval change in intraventricular hemorrhage. 5. Similar 6 mm left-to-right shift related to herniation. Basilar cisterns remain patent. 6. No other new acute intracranial abnormality.   MRI Brain W &  WO Contrast 08/20/19 IMPRESSION: Right hemispheric infarction.  Decompressive craniectomy with herniation of brain into the craniectomy site.  Overlying subgaleal fluid collection on the right. Large irregularly enhancing fluid collection along the anterior margin of the craniectomy measuring approximately 5.4 x 9.8 cm. This may represent a large abscess. Large area of hemorrhage in the right temporoparietal infarct also shows peripheral enhancement. This process may be related to sterile hematoma versus infected hematoma. Mild ventricular enlargement, stable  These results were called by telephone at the time of interpretation on 08/20/2019 at 11:35 am to provider Kathyrn Sheriff MD, who verbally acknowledged these results.   PHYSICAL EXAM General- Well nourished, well developed, middle-aged African-American male. He has right hemicraniectomy surgical incision on the scalp, bulging.  Right head bulging from crani site.  Ophthalmologic- fundi not  visualized due to noncooperation.  Cardiovascular - Regular rhythm and rate.  Neuro - patient with open eyes. Nonverbal, Not following commands as well as prior but moves right arm to voice command consistently. Eyes in right gaze position, does not cross midline, not blinking to visual threat to the left, PERRL. Left facial droop. Tongue protrusion not corporative. Has spontaneous movement of RUE against gravity and RLE in bed 3/5 and moving spontaneously. On pain stimulation, mild withdraw of LLE, but no movement of LUE. DTR 1+ and toes equiv. Sensation, coordination and gait not tested.   ASSESSMENT/PLAN [name] is a 43 y.o. male with no significant past medical history found down x 2 days nonverbal with L hemiparesis.   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 Trace L midline shift. ELVO at R ICA, R A1, R MCA.  MRI  Large R brain infarct sparing R PCA. Cytotoxic edema but no hemorrhage. Stable trace midline shift.  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 R ICA terminus, R ACA, R MCA occluded.   CT head similar w/ low density and sweddling R ACA and MCA territories, now with 5-47mm midline shift. no hemorrhage  CT head acute R ACA and MCA infarct w/ progressive swelling decompressed through craniectomy defect.   CTA head and neck stable MLS. R ICA occlusion in neck that continues into R ACA and MCA. Mild petechial hemorrhage at basal ganglia  CT Head 8/29 - Unchanged appearance of massive right MCA and ACA territory infarcts with parenchyma extending through decompressive craniectomy. Small focus of suspected hemorrhage adjacent to the right caudate head.  CT head 07/30/19 - New hematoma within the infarcted tissue   CT head 08/01/2019 stable w/o change  CT head 08/11/19 - new patchy hemorrhage along periphery of infarcted R frontoparietal lobe (likely HT) decreased dominant anterior margin hemorrhage. Stable herniation of infarcted brain through crani site.   CT head 9/20 - worsening with increased HT on the right and new IVH  CT head 08/15/19 - slightly worsened HT from previous.   CT head 9/22 hemorrhagic R ACA and MCA territory infarcts with edematous brain bulging through Crani defect.  No change x2 days  CT head 9/24 stable hematoma, increased right hemisphere mass-effect, external herniation from Crani site, making right lateral ventriculomegaly than prior images.  2D Echo EF 60-65%  2D echo w/ bubble ordered no LRS   LE venous doppler DVT in R peroneal, L  popliteal, L posterior tibial and L peroneal  LE venous doppler repeat DVT in R peroneal, L popliteal, L posterior tibial and L peroneal  TCD bubble no HITS, no PFO  LDL 83  HgbA1c 5.4  UDS positive THC  Hypercoagulable and autoimmune work up negative  SCDs for VTE prophylaxis  No antithrombotic prior to admission, treated DVT with heparin IV, then  discontinued due to hemorrhagic conversion with right frontal HT, then resumed heparin IV given extensive DVT and PE, but now heparin IV again discontinued due to worsening HT  Therapy recommendations:  SNF (reassessed by CIR and declined 9/16)  Disposition:  pending   Cyctotoxic cerebral edema  S/p R decompressive hemicraniectomy (Nundkumar) w/ flap R abd 07/19/2019  PICC placed - keep for  now per Leonie Man  Given One dose of 23.4% 8/26; 3%  off 8/30 1700  CT repeat 07/30/19 - New parenchymal hemorrhage within the infarcted tissue  Off helmet to further release pressure  CT repeat 08/01/2019 stable w/o change  CT head repeat 08/11/19 - new patchy hemorrhage along periphery of infarcted R frontoparietal lobe (likely HT) but previous right frontal hematoma now much reduced in size  CT head 9/20 worsening right HT with new ICH and IVH  CT Head 9/21 - slightly worsened HT from previous.   CT head 9/22 hemorrhagic R ACA and MCA territory infarcts with edematous brain bulging through Crani defect.  No change x2 days  CT head 9/24 stable hematoma, increased right hemisphere mass-effect, external herniation from Crani site, making right lateral ventriculomegaly than prior images. - discussed with Dr. Kathyrn Sheriff, no need of any intervention, continue to monitor  Monitor Na 141->139->138->143  Right ICA occlusion  MRA and CTA showed right ICA occlusion from origin to terminal  Wife denies any head trauma  Wife stated that pt had recent aggressive exercise with weight lifting  Concerning dissection as working diagnosis of stroke  etiology   B LE DVT Small LLL PE  LE venous doppler DVT in R peroneal, L popliteal, L posterior tibial and L peroneal  Etiology unclear  Started IV heparin 07/26/2019  IVC filter placed 8/27. Plan retrieval in 8-12 weeks as an IP in an IR clinic  Hypercoagulable work up negative   Off SCDs  LE venous doppler 08/02/19 repeat extensive DVT in R peroneal, L popliteal, L posterior tibial and L peroneal up to the IVC filter  CT chest Nonocclusive thrombus seen within the left posterior lower lobe segmental artery.   Treated with IV Heparin but then off secondary to intracranial hemorrhage, then restarted IV heparin given extensive DVT and PE but again stopped due to worsening HT on 9/20  Hematology consult, appreciate help - hypercoag w/u neg.  At risk for recurrent clots   Factor V Leiden, homocysteine, Prot C&S, Prothombin gene mutation all neg.   Abdominal wall hematoma  Large fluid collection seen on CT abdomen  CT abd/pel concerning for abdominal wall fluid collection likely old hematoma  Dr. Kathyrn Sheriff did fluid aspiration and seems to be old blood component  Fluid aspiration sent for culture 9/11 - no growth < 24 hrs  Repeat ABD [place] complex seroma vs liquified hematoma R abd similar to previous  Seizure-like activity  EEG continuous slowing, excessive beta activity, sleep spindle asymmetry decreased R->related to stroke and sedation. No SZ  Long Term EEG cortical dysfunction in right frontal region  Seizure precautions  Continue Keppra   SIRS/fever/leukocytosis - absorbing fever due to necrotic tissue most likely  Tmax - 101.7->afebrile -> afebrile ->100.9->102.5->101.1->102.8->103.7->100.7->99.7  WBC - 10.7->10.0->9.1->10.9->17.2->16.6->12.5->10.0  CXR x 3 NAD  Blood cultures - neg, repeated again 08/04/19 -> no growth on empiric unasyn -> changed to vanco, cefepime and flagyl 08/05/19->now off  Lactic acid 1.2->2.0  Hepatitis panel 9/12 - neg  procalcitonin  and lactic acid normal   Continue baclofen 10 mg tid, bromocriptine 5 mg q6h, and 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 Leonie Man to see in the morning  UTI  Urine culture pseudomonas - sensitive to zosyn  UA 9/21 - WBC 0-5 negative   UA 9/23 - WBC 11-20  Repeat B Cx no growth 1 day  On off zosyn   Tachycardia, resolved  HR normal now  Likely due to fever  Hydration with IVF, TF and free water  On metoprolol to 50 Q8h  EKG ST, rate 120 w/ possible LA enlargement  Troponin series neg x 3    Hyperlipidemia  Home meds:  no statin  LDL 83, goal < 70  Was on lipitor 20 mg daily   D/c statin due to elevated LFT  Elevated LFT, almost resolved  AST - 105-118-98-85-38  ALT - [phone]  CK 2063-852 -[id number]  Hepatitis panel 9/12 - neg  Dysphagia . Secondary to stroke . Dysphagia 1 diet with thin liquid -> NPO due to lethargy with worsening HT  Cortrak w/ TF  Add NS @ 40cc for IVF  Speech on board   Cerebral Abscess / Empyema (evaluated by Dr Rory Percy and Dr Kathyrn Sheriff)  CT 08/20/19 - Irregular collections about the craniectomy site with question of disruption of the underlying dura, which could reflect sequelae of underlying CSF leak. Correlation with physical exam and fluid sampling suggested for further evaluation.  MRI W&WO - 08/20/19 - ...herniation of brain into the craniectomy site. Large irregularly enhancing fluid collection along the anterior margin of the craniectomy measuring approximately 5.4 x 9.8 cm. This may represent a large abscess....sterile hematoma versus infected hematoma.   Plan per Dr Kathyrn Sheriff - "Percutaneous aspiration of the clearly purulent pocket anteriorly and start broad spectrum IV abx. Would also consider infectious disease consult." (Dr Kathyrn Sheriff notified Dr Tommy Medal ID)  Tmax - 101.7->afebrile -> afebrile ->100.9->102.5->101.1->102.8->103.7->100.7->99.7  WBC -  10.7->10.0->9.1->10.9->17.2->16.6->12.5->10.0  Now on Maxipime and Vancomycin   Appreciate ID consult and NS assistance  Agree with recommendations for Palliative Care consult - ordered   Other Stroke Risk Factors  Former Cigarette smoker, quit 3 yrs ago  ETOH use  Substance abuse UDS - positive for THC   Obesity, Body mass index is 27.7 kg/m., slightly overweight, recommend weight loss, diet and exercise as appropriate   Other Active Problems  Normocytic anemia - hematology felt d/t ongoing blood draws 10.0->9.5->10.3->10.3->10.8->10.7->10.6  Hospital day # 33  This patient is critically ill and at significant risk of neurological worsening, death and care requires constant monitoring of vital signs, hemodynamics,respiratory and cardiac monitoring,review of multiple databases, neurological assessment, discussion with family, other specialists and medical decision making of high complexity.I  I spent 35 minutes of neurocritical care time in the care of this patient.    To contact Stroke Continuity provider, please refer to [email or web address] After hours, contact General Neurology

## 2019-08-21 NOTE — Progress Notes (Signed)
  NEUROSURGERY PROGRESS NOTE   No issues overnight.   EXAM:  BP 125/89 (BP Location: Left Arm)   Pulse 86   Temp 97.9 F (36.6 C) (Axillary)   Resp (!) 22   Ht 5\' 11"  (1.803 m)   Wt 90.1 kg   SpO2 100%   BMI 27.70 kg/m   Eyes open spontaneously Breathing spontaneously Not following commands Moves RUE/RLE spontaneously Right scalp is tense with purulent discharge noted from anterosuperior limb of the wound. The scalp above the lateral orbit is ballotable coinciding with area of fluid seen on MRI.  IMPRESSION:  43 y.o. male s/p large right hemispheric stroke, subsequent PE requiring heparin and complicated by hemorrhagic transformation in right temporo-occipital region now presenting with epidural empyema and likely underlying cerebritis.  PLAN: - Frontal subcutaneous/epidural abscess aspirated at bedside under sterile conditions - 40cc bloody/purulent fluid with significant debris obtained and sent for culture - Cont broad spectrum abx, appreciate ID input - Cont current supportive care

## 2019-08-22 DIAGNOSIS — I63311 Cerebral infarction due to thrombosis of right middle cerebral artery: Secondary | ICD-10-CM

## 2019-08-22 LAB — COMPREHENSIVE METABOLIC PANEL
ALT: 53 U/L — ABNORMAL HIGH (ref 0–44)
AST: 36 U/L (ref 15–41)
Albumin: 2.5 g/dL — ABNORMAL LOW (ref 3.5–5.0)
Alkaline Phosphatase: 86 U/L (ref 38–126)
Anion gap: 9 (ref 5–15)
BUN: 16 mg/dL (ref 6–20)
CO2: 21 mmol/L — ABNORMAL LOW (ref 22–32)
Calcium: 7.8 mg/dL — ABNORMAL LOW (ref 8.9–10.3)
Chloride: 111 mmol/L (ref 98–111)
Creatinine, Ser: 0.71 mg/dL (ref 0.61–1.24)
GFR calc Af Amer: >60 mL/min (ref >60)
GFR calc non Af Amer: >60 mL/min (ref >60)
Glucose, Bld: 126 mg/dL — ABNORMAL HIGH (ref 70–99)
Potassium: 3.2 mmol/L — ABNORMAL LOW (ref 3.5–5.1)
Sodium: 141 mmol/L (ref 135–145)
Total Bilirubin: 0.6 mg/dL (ref 0.3–1.2)
Total Protein: 6.1 g/dL — ABNORMAL LOW (ref 6.5–8.1)

## 2019-08-22 LAB — CULTURE, BLOOD (ROUTINE X 2)
Culture: NO GROWTH
Culture: NO GROWTH
Special Requests: ADEQUATE

## 2019-08-22 LAB — GLUCOSE, CAPILLARY
Glucose-Capillary: 102 mg/dL — ABNORMAL HIGH (ref 70–99)
Glucose-Capillary: 103 mg/dL — ABNORMAL HIGH (ref 70–99)
Glucose-Capillary: 106 mg/dL — ABNORMAL HIGH (ref 70–99)
Glucose-Capillary: 115 mg/dL — ABNORMAL HIGH (ref 70–99)
Glucose-Capillary: 123 mg/dL — ABNORMAL HIGH (ref 70–99)
Glucose-Capillary: 92 mg/dL (ref 70–99)

## 2019-08-22 MED ORDER — HEPARIN SODIUM (PORCINE) 5000 UNIT/ML IJ SOLN
5000.0000 [IU] | Freq: Three times a day (TID) | INTRAMUSCULAR | Status: DC
  Administered 2019-08-22 – 2019-09-06 (×46): 5000 [IU] via SUBCUTANEOUS
  Filled 2019-08-22 (×46): qty 1

## 2019-08-22 MED ORDER — POTASSIUM CHLORIDE 20 MEQ PO PACK
40.0000 meq | PACK | Freq: Two times a day (BID) | ORAL | Status: AC
  Administered 2019-08-22 (×2): 40 meq
  Filled 2019-08-22 (×2): qty 2

## 2019-08-22 MED ORDER — METRONIDAZOLE IN NACL 5-0.79 MG/ML-% IV SOLN: 500.0000 mg | Freq: Three times a day (TID) | INTRAVENOUS | Status: DC

## 2019-08-22 MED ORDER — BROMOCRIPTINE MESYLATE 2.5 MG PO TABS
5.0000 mg | ORAL_TABLET | Freq: Three times a day (TID) | ORAL | Status: DC
  Administered 2019-08-22 – 2019-08-26 (×12): 5 mg
  Filled 2019-08-22 (×14): qty 2

## 2019-08-22 MED ORDER — BUSPIRONE HCL 15 MG PO TABS
30.0000 mg | ORAL_TABLET | Freq: Two times a day (BID) | ORAL | Status: DC
  Administered 2019-08-22: 21:00:00 30 mg
  Filled 2019-08-22: qty 2

## 2019-08-22 NOTE — Progress Notes (Signed)
Occupational Therapy Treatment Patient Details Name: Matthew Black MRN: [id number] DOB: 10-22-1976 Today's Date: 08/22/2019    History of present illness Pt is a 43 y.o. M with no known PMH who presents on 8/25 after being found down at work, nonverbal with L hemiparesis. CT head showing large R hemisphere infarct with edema and mild mass effect. 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 Stable trace midline shift. S/p hemicraniectomy for  edema with abdominal flap implant. BLE DVT vasc US 8/28: acute DVT bilateral lower extremities, s/p IVC placement.  On 9/21 Repeat CT stable large hemorrhagic conversion.   OT comments  Pt progressing towards established OT goals and presenting with increased engagement and trunk control. Pt requiring Total A for bed mobility and for toilet hygiene at bed level. Pt sitting at EOB with Min A for balance; requiring occasional Max A for posture corrections. Focused on sitting balance, trunk control, and righting reactions. Continue to recommend dc to SNF and will continue to follow acutely as admitted.    Follow Up Recommendations  SNF;Supervision/Assistance - 24 hour    Equipment Recommendations  Other (comment)(Defer to next venue)    Recommendations for Other Services      Precautions / Restrictions Precautions Precautions: Fall Precaution Comments: L hemiparesis, R skull missing, abd bone flap Required Braces or Orthoses: Other Brace Other Brace: helmet (not wearing due to presses on edema on R side of head) Restrictions Weight Bearing Restrictions: No       Mobility Bed Mobility Overal bed mobility: Needs Assistance Bed Mobility: Rolling Rolling: Total assist;+2 for physical assistance Sidelying to sit: Total assist;+2 for physical assistance Supine to sit: Total assist;HOB elevated;+2 for physical assistance Sit to supine: +2 for physical assistance;Total assist;HOB elevated   General bed mobility  comments: pt dependent to roll L/R for hygiene s/p large BM, pt then totalAx2 for trunk and LE mangement to/from EOB  Transfers                 General transfer comment: deferrred due to not having tilt in space w/c available    Balance Overall balance assessment: Needs assistance Sitting-balance support: Feet supported;Bilateral upper extremity supported Sitting balance-Leahy Scale: Zero Sitting balance - Comments: pt with L lateral lean, varied from min to maxA, able to sit x 10 min, pt did use R UE with push/pull on PT to assist with attempting to maintain balance, pt requires max verbal and tactile cues                                   ADL either performed or assessed with clinical judgement   ADL Overall ADL's : Needs assistance/impaired                             Toileting- Clothing Manipulation and Hygiene: Total assistance;+2 for physical assistance;Bed level Toileting - Clothing Manipulation Details (indicate cue type and reason): total A for toilet hygiene at bed level atfer bowel incontience.        General ADL Comments: Focused session on sitting balance at EOB and right reactions.      Vision   Additional Comments: Pt tracking throughout visual field. Noting continues right gaze and head turn.    Perception     Praxis      Cognition Arousal/Alertness: Awake/alert Behavior During Therapy: Flat  affect Overall Cognitive Status: Impaired/Different from baseline Area of Impairment: Attention;Following commands;Problem solving                   Current Attention Level: Focused   Following Commands: Follows one step commands inconsistently;Follows one step commands with increased time Safety/Judgement: Decreased awareness of safety;Decreased awareness of deficits Awareness: Intellectual(was incontinent of stool and unable to communicate) Problem Solving: Slow processing;Decreased initiation;Difficulty sequencing;Requires  verbal cues;Requires tactile cues General Comments: pt with increased engagement in session, following simple commands ~75%, and participating in sitting at EOB. Pt continues to present with poor processing, problem solving, and awareness.        Exercises Exercises: Other exercises Other Exercises Other Exercises: PROM of neck and head Other Exercises: Right reactions while sitting at EOB. Pt requiring Min A for slight balance changed; occasionally requiring Max A for corrections   Shoulder Instructions       General Comments VSS    Pertinent Vitals/ Pain       Pain Assessment: Faces Faces Pain Scale: No hurt Pain Location: grimacing in general with rolling and transfer to EOB Pain Descriptors / Indicators: Grimacing;Discomfort Pain Intervention(s): Limited activity within patient's tolerance;Monitored during session;Repositioned  Home Living                                          Prior Functioning/Environment              Frequency  Min 2X/week        Progress Toward Goals  OT Goals(current goals can now be found in the care plan section)  Progress towards OT goals: Progressing toward goals  Acute Rehab OT Goals Patient Stated Goal: unable OT Goal Formulation: Patient unable to participate in goal setting Time For Goal Achievement: 08/31/19 Potential to Achieve Goals: Fair ADL Goals Additional ADL Goal #1: pt will demonstrate sustain attention to adl task Additional ADL Goal #2: pt will sit Eob with max (A) as precursor to adls. Additional ADL Goal #3: pt will follow 2 step command  Plan Discharge plan remains appropriate;Frequency remains appropriate    Co-evaluation    PT/OT/SLP Co-Evaluation/Treatment: Yes Reason for Co-Treatment: Complexity of the patient's impairments (multi-system involvement);For patient/therapist safety;To address functional/ADL transfers PT goals addressed during session: Mobility/safety with mobility OT  goals addressed during session: ADL's and self-care      AM-PAC OT "6 Clicks" Daily Activity     Outcome Measure   Help from another person eating meals?: Total Help from another person taking care of personal grooming?: Total Help from another person toileting, which includes using toliet, bedpan, or urinal?: Total Help from another person bathing (including washing, rinsing, drying)?: Total Help from another person to put on and taking off regular upper body clothing?: Total Help from another person to put on and taking off regular lower body clothing?: Total 6 Click Score: 6    End of Session    OT Visit Diagnosis: Unsteadiness on feet (R26.81);Muscle weakness (generalized) (M62.81);Hemiplegia and hemiparesis Hemiplegia - Right/Left: Left Hemiplegia - dominant/non-dominant: Non-Dominant Hemiplegia - caused by: Nontraumatic intracerebral hemorrhage   Activity Tolerance Patient tolerated treatment well   Patient Left in bed;with call bell/phone within reach;with family/visitor present   Nurse Communication Mobility status;Precautions        Time: [id number] OT Time Calculation (min): 23 min  Charges: OT General Charges $OT Visit: 1 Visit OT Treatments $  Self Care/Home Management : 8-22 mins  [place], OTR/L Acute Rehab Pager: [phone] Office: [place] 08/22/2019, 4:18 PM

## 2019-08-22 NOTE — Progress Notes (Signed)
STROKE TEAM PROGRESS NOTE   INTERVAL HISTORY Patient was diagnosed with cerebritis and brain abscess over the weekend and Dr. Kathyrn Sheriff aspirated some fluid from his brain which had elevated white counts and culture is pending.  He was started on vancomycin and cefepime and seems to have responded and fever curve is coming down.  He is alert and more interactive and following commands consistently on the right side.  He remains plegic on the left.  No family at the bedside.  Vitals:   08/22/19 0500 08/22/19 0600 08/22/19 0700 08/22/19 0800  BP: (!) 121/91 (!) 125/107 121/85   Pulse: 100 (!) 105    Resp: 15 17 14    Temp:    99.9 F (37.7 C)  TempSrc:    Axillary  SpO2: 100% 100%    Weight:      Height:        CBC:  Recent Labs  Lab 08/19/19 0517 08/20/19 0411  WBC 12.5* 10.0  HGB 10.7* 10.6*  HCT 33.7* 32.1*  MCV 87.5 86.3  PLT 226 [id number]    Basic Metabolic Panel:  Recent Labs  Lab 08/20/19 0411 08/22/19 0442  NA 143 141  K 3.5 3.2*  CL 112* 111  CO2 21* 21*  GLUCOSE 111* 126*  BUN 18 16  CREATININE 0.93 0.71  CALCIUM 8.4* 7.8*    IMAGING Ct Head Wo Contrast Ct Cervical Spine Wo Contrast  07/19/2019 1132 1. 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 2. No associated hemorrhage and mild intracranial mass effect at this time, including trace leftward midline shift. 3. Evidence of large vessel occlusion: Hyperdensity of the right ICA terminus, the right A1 and MCA. 4. Unaffected brain parenchyma appears negative. 5.  No acute traumatic injury identified in the cervical spine.   Mr Brain 46 Contrast Mr Angio Head Wo Contrast Mr Angio Neck Wo Contrast 07/19/2019 1330 1. 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 The right ICA terminus, right ACA and MCA also appear occluded. 2. 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 Cytotoxic edema but no convincing hemorrhage. Stable mild mass effect  with trace leftward midline shift. 3. Intracranial MRA is degraded by motion artifact, but no other large vessel occlusion is suspected. There appears to be lobes reperfusion of the right PCA. 4. The left hemisphere and posterior fossa brain parenchyma appears normal.   Ct Head Wo Contrast 07/19/2019 1532 Similar appearance to earlier. Low-density and swelling of the right hemisphere in the ACA and MCA territories with mass effect and right-to-left shift of 5-6 mm. No hemorrhagic transformation at this time.   Ct Head Wo Contrast 07/20/2019 0444 1. Acute right ACA and MCA territory infarct with progressive swelling that has decompressed through the craniectomy defect. 2. No acute hemorrhage or new infarction.   Ct Angio Head W Or Wo Contrast Ct Angio Neck W Or Wo Contrast 07/21/2019 1. Stable from prior MRA. 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 No evidence of atherosclerosis or vasculopathy in the other vessels. 2. Cytotoxic edema causes 5 mm of midline shift and brain bulging through the craniectomy defect. Mild petechial hemorrhage is seen at the basal ganglia.   Ct Head Wo Contrast 07/23/2019 1. Unchanged appearance of massive right MCA and ACA territory infarcts with parenchyma extending through decompressive craniectomy. 2. Small focus of suspected hemorrhage adjacent to the right caudate head.  07/30/2019  1. Decreasing mass effect within large right anterior MCA and ACA territory infarct. 2. Midline shift is no longer present. There is decreased effacement of the right lateral ventricle. 3. Infarcted brain tissue still herniates through the craniectomy site. 4. New parenchymal hemorrhage within the infarcted tissue anteriorly measures 2.5 x 2.3 x 2.7 cm. 5. No new infarct.  08/01/2019  1. Stable head CT since 07/30/2019. 2. Again noted is a large infarct involving the right MCA and right ACA territories with brain tissue herniating  through the right craniotomy defect. 3. Parenchymal hemorrhage in the right anterior cortex has minimally changed. Stable petechial hemorrhage in the right basal ganglia region.   08/11/2019 1. New patchy parenchymal hemorrhages along the periphery of the infarcted RIGHT frontoparietal lobe, compatible with hemorrhagic conversion, largest focus underlying the RIGHT frontal bone measures 1.3 cm greatest dimension. 2. Previously described dominant focus of hemorrhage along the anterior margin of the craniectomy site has decreased in size and density compared to previous exams, compatible with expected evolution. 3. Stable herniation of infarcted brain through the RIGHT frontal-parietal-temporal craniectomy site. 4. No additional mass effect or midline shift on today's exam.   Ct Angio Chest Pe W Or Wo Contrast 08/01/2019 Nonocclusive thrombus seen within the left posterior lower lobe segmental artery.    [place] ABD Limited 08/11/2019 Suspected mildly complex seroma versus liquified hematoma in the superficial soft tissues of the RIGHT abdomen, measuring 7.9 cardiomyopathy greatest dimension, surrounding patient's calvarium (location of patient's skull status post partial craniectomy), appearance less suggestive of abscess. The collection seen today by ultrasound is similar to the appearance of the collection on earlier CT abdomen of 08/04/2019.  2D Echocardiogram 07/20/2019  1. The left ventricle has normal systolic function with an ejection fraction of 60-65%. The cavity size was normal. Left ventricular diastolic parameters were normal.  2. The right ventricle has normal systolic function. The cavity was normal. There is no increase in right ventricular wall thickness.  3. The pericardial effusion is circumferential.  4. Trivial pericardial effusion is present.  5. The mitral valve is grossly normal.  6. The tricuspid valve is grossly normal.  7. The aortic valve is tricuspid. No stenosis of the  aortic valve.  8. The aorta is normal unless otherwise noted.  9. The aortic root is normal in size and structure. 10. No cardiac source of embolism identified. 11. When compared to the prior study: No comparison.  LE Dopplers 08/02/2019 Right: Findings consistent with acute deep vein thrombosis involving the right peroneal veins.  Left: Findings consistent with acute deep vein thrombosis involving the left common femoral vein, left femoral vein, left proximal profunda vein, left popliteal vein, left posterior tibial veins, and left peroneal veins. Extending up into left iliac vein  and IVC. 07/21/2019 Right: Findings consistent with acute deep vein thrombosis involving the right peroneal veins. Left: Findings consistent with acute deep vein thrombosis involving the left popliteal vein, left posterior tibial veins, and left peroneal veins.  Vas [place] Transcranial Doppler W Bubbles 07/25/2019 No HITS heard heard at rest. No HITS heard heard during valsalva.   2D echo w/bubble 08/03/2019 Limited bubble study for shunt. No evidence for atrial level right to left shunt.  Ct Abdomen Pelvis W Contrast 08/04/2019 1. Large fluid collection in the lower right anterior abdominal wall subcutaneous fat with what appears to be an internal surgical drain, favored to represent a postoperative hematoma or proteinaceous seroma, however, the possibility of an abscess is not excluded in light of  the patient's fever.  2. Small high attenuation fluid collection along the left pelvic sidewall which likely represents a hematoma, as above.  3. Additional incidental findings, as above.  Ct Head Wo Contrast 08/14/2019 1. Significant increase in the amount of acute hemorrhage along the periphery of patient's infarcted RIGHT hemisphere, most prominently developed adjacent to the posterior margin of the craniectomy site. There is associated increase in mass effect causing increased herniation through the craniectomy defect.  The mass effect is also causing increased compression of the RIGHT lateral ventricle.  2. New intraventricular extension of the acute hemorrhage.  3. No appreciable change of the mild leftward midline shift. No evidence of tonsillar herniation.   CT Head WO Contrast 08/15/19 1. Postoperative changes from prior right craniectomy with extensive mass effect and herniation of the infarcted brain through the craniectomy defect, similar to previous. 2. Associated hemorrhagic transformation within the infarcted right cerebral hemisphere with dominant hematoma measuring up to 10.1 x 5.5 x 5.9 cm, slightly worsened from previous. Associated intraventricular extension and mass effect on the right lateral ventricle without worsening hydrocephalus or ventricular trapping, similar to previous. 3. No other new acute intracranial abnormality.  Ct Head Wo Contrast 08/16/2019 1. Hemorrhagic right ACA and MCA territory infarcts with swollen brain bulging through the craniectomy defect. Intraventricular hemorrhage with ventriculomegaly. No change from 2 days ago.   CXR 08/14/2019 1. Lungs are clear.  No evidence of pneumonia or aspiration 2. Stable position of the support apparatus. 08/15/2019 Stable support apparatus. No acute cardiopulmonary abnormality seen. 08/17/2019 No significant change in AP portable examination. No acute airspace Opacity.  Ct Head Wo Contrast 08/18/2019 1. Lateral ventriculomegaly appears somewhat increased from prior examination. Consider short interval CT follow-up. 2. Hemorrhagic right ACA and MCA territory infarcts with right cerebral swelling and external herniation through right-sided craniectomy defect, similar to prior exam. Intraventricular hemorrhage has not significantly changed in amount, although with some interval redistribution. 3. Unchanged 7 mm rightward midline shift related to right-sided external herniation.  Ct Head Wo Contrast 08/20/2019 1. No significant  interval change since previous exam from 08/18/2019. 2. Continued interval evolution of hemorrhagic right ACA and MCA territory infarcts with extensive edema and herniation through the right craniectomy defect. 3. Irregular collections about the craniectomy site with question of disruption of the underlying dura, which could reflect sequelae of underlying CSF leak. Correlation with physical exam and fluid sampling suggested for further evaluation. 4. No significant interval change in intraventricular hemorrhage. 5. Similar 6 mm left-to-right shift related to herniation. Basilar cisterns remain patent. 6. No other new acute intracranial abnormality.  MRI Brain W &  WO Contrast 08/20/19 Right hemispheric infarction.  Decompressive craniectomy with herniation of brain into the craniectomy site.  Overlying subgaleal fluid collection on the right. Large irregularly enhancing fluid collection along the anterior margin of the craniectomy measuring approximately 5.4 x 9.8 cm. This may represent a large abscess. Large area of hemorrhage in the right temporoparietal infarct also shows peripheral enhancement. This process may be related to sterile hematoma versus infected hematoma. Mild ventricular enlargement, stable   PHYSICAL EXAM    General- Well nourished, well developed, middle-aged African-American male. He has right hemicraniectomy surgical incision on the scalp, bulging.  Right head bulging from crani site.  Ophthalmologic- fundi not visualized due to noncooperation.  Cardiovascular - Regular rhythm and rate.  Neuro - patient with open eyes. Nonverbal,  following commands on the right side and moves right upper and lower extremity purposefully to command.  Eyes in right gaze position, does not cross midline, not blinking to visual threat to the left, PERRL. Left facial droop. Tongue protrusion not corporative. Has spontaneous movement of RUE against gravity and RLE in bed 3/5 and moving  spontaneously. On pain stimulation, mild withdraw of LLE, but no movement of LUE. DTR 1+ and toes equiv. Sensation, coordination and gait not tested.   ASSESSMENT/PLAN Matthew Black is a 43 y.o. male with no significant past medical history found down x 2 days nonverbal with L hemiparesis.   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 Trace L midline shift. ELVO at R ICA, R A1, R MCA.  MRI  Large R brain infarct sparing R PCA. Cytotoxic edema but no hemorrhage. Stable trace midline shift.  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 R ICA terminus, R ACA, R MCA occluded.   CT head similar w/ low density and sweddling R ACA and MCA territories, now with 5-2mm midline shift. no hemorrhage  CT head acute R ACA and MCA infarct w/ progressive swelling decompressed through craniectomy defect.   CTA head and neck stable MLS. R ICA occlusion in neck that continues into R ACA and MCA. Mild petechial hemorrhage at basal ganglia  CT Head 8/29 - Unchanged appearance of massive right MCA and ACA territory infarcts with parenchyma extending through decompressive craniectomy. Small focus of suspected hemorrhage adjacent to the right caudate head.  CT head 07/30/19 - New hematoma within the infarcted tissue   CT head 08/01/2019 stable w/o change  CT head 08/11/19 - new patchy hemorrhage along periphery of infarcted R frontoparietal lobe (likely HT) decreased dominant anterior margin hemorrhage. Stable herniation of infarcted brain through crani site.   CT head 9/20 - worsening with increased HT on the right and new IVH  CT head 08/15/19 - slightly worsened HT from previous.   CT head 9/22 hemorrhagic R  ACA and MCA territory infarcts with edematous brain bulging through Crani defect.  No change x2 days  CT head 9/24 stable hematoma, increased right hemisphere mass-effect, external herniation from Crani site, making right lateral ventriculomegaly than prior images.  2D Echo EF 60-65%  2D echo w/ bubble ordered no LRS   LE venous doppler DVT in R peroneal, L popliteal, L posterior tibial and L peroneal  LE venous doppler repeat DVT in R peroneal, L popliteal, L posterior tibial and L peroneal  TCD bubble no HITS, no PFO  LDL 83  HgbA1c 5.4  UDS positive THC  Hypercoagulable and autoimmune work up negative  Add Heparin 5000 units sq tid for VTE prophylaxis  No antithrombotic prior to admission, treated DVT with heparin IV, then  discontinued due to hemorrhagic conversion with right frontal HT, then resumed heparin IV given extensive DVT and PE, but now heparin IV again discontinued due to worsening HT  Therapy recommendations:  SNF (reassessed by CIR and declined 9/16)  Disposition:  pending   Right ICA occlusion  MRA and CTA showed right ICA occlusion from origin to terminal  Wife denies any head trauma  Wife stated that pt had recent aggressive exercise with weight lifting  Concerning dissection as working diagnosis  of stroke etiology   Cyctotoxic cerebral edema  S/p R decompressive hemicraniectomy (Nundkumar) w/ flap R abd 07/19/2019  PICC placed - keep for now per Leonie Man  Given One dose of 23.4% 8/26; 3%  off 8/30 1700  CT repeat 07/30/19 - New parenchymal hemorrhage within the infarcted tissue  Off helmet to further release pressure  CT repeat 08/01/2019 stable w/o change  CT head repeat 08/11/19 - new patchy hemorrhage along periphery of infarcted R frontoparietal lobe (likely HT) but previous right frontal hematoma now much reduced in size  CT head 9/20 worsening right HT with new ICH and IVH  CT Head 9/21 - slightly worsened HT from previous.   CT head 9/22  hemorrhagic R ACA and MCA territory infarcts with edematous brain bulging through Crani defect.  No change x2 days  CT head 9/24 stable hematoma, increased right hemisphere mass-effect, external herniation from Crani site, making right lateral ventriculomegaly than prior images. - discussed with Dr. 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 Correlation with physical exam and fluid sampling suggested for further evaluation.  MRI W&WO - 08/20/19 - ...herniation of brain into the craniectomy site. Large irregularly enhancing fluid collection along the anterior margin of the craniectomy measuring approximately 5.4 x 9.8 cm. This may represent a large abscess....sterile hematoma versus infected hematoma.   Dr Kathyrn Sheriff aspirated 9/27 and sent for cx. Not a surgical candidate d/t ris increasing herniation. .  Cx moderate WBC, no organisms - no growth < 24h  Tmax - 99.9  WBC - 10.0  ID consulted   Now on Maxipime, Vancomycin. Flagyl stopped  Poor prognosis. Palliative Care consulted  SIRS/fever/leukocytosis due to necrotic brain tissue   Tmax - 99.7  WBC - 10.0  CXR x 3 NAD  Blood cultures - neg, repeated again 08/04/19 -> no growth on empiric unasyn -> changed to vanco, cefepime and flagyl 08/05/19->now off  Lactic acid 1.2->2.0  Hepatitis panel 9/12 - neg  procalcitonin and lactic acid normal   As no longer a central neurogenic fever begin weaning:  baclofen 10 mg tid, decreased bromocriptine 5 mg q8h, and buspar 30mg  bid   B LE DVT Small LLL PE  LE venous doppler DVT in R peroneal, L popliteal, L posterior tibial and L peroneal  Etiology unclear  Started IV heparin 07/26/2019  IVC filter placed 8/27. Plan retrieval in 8-12 weeks as an IP in an  IR clinic  Hypercoagulable work up negative   LE venous doppler 08/02/19 repeat extensive DVT in R peroneal, L popliteal, L posterior tibial and L peroneal up to the IVC filter  CT chest Nonocclusive thrombus seen within the left posterior lower lobe segmental artery.   Treated with IV Heparin but then off secondary to intracranial hemorrhage, then restarted IV heparin given extensive DVT and PE but again stopped due to worsening HT on 9/20  Hematology consult, appreciate help - hypercoag w/u neg.  At risk for recurrent clots   Factor V Leiden, homocysteine, Prot C&S, Prothombin gene mutation all neg.   Abdominal wall hematoma  Large fluid collection seen on CT abdomen  CT abd/pel concerning for abdominal wall fluid collection likely old hematoma  Dr. Kathyrn Sheriff did fluid aspiration and seems to be old blood component  Fluid aspiration sent for culture 9/11 -  no growth < 24 hrs  Repeat ABD [place] complex seroma vs liquified hematoma R abd similar to previous  Seizure-like activity  EEG continuous slowing, excessive beta activity, sleep spindle asymmetry decreased R->related to stroke and sedation. No SZ  Long Term EEG cortical dysfunction in right frontal region  Seizure precautions  Continue Keppra   UTI, resolved  Urine culture pseudomonas - treated with  to zosyn  UA 9/21 - WBC 0-5 negative   UA 9/23 - WBC 11-20  Repeat B Cx no growth 5 days  Tachycardia, resolved  HR normal now  Likely due to fever  Hydration with IVF, TF and free water  On metoprolol to 50 Q8h  EKG ST, rate 120 w/ possible LA enlargement  Troponin series neg x 3    Hyperlipidemia  Home meds:  no statin  LDL 83, goal < 70  Was on lipitor 20 mg daily   D/c statin due to elevated LFT  Consider statin at d/c if LFTs down  Elevated LFT, almost resolved  AST - 105-118-98-85-38-36  ALT - 80-68-65-164-64-53  CK [id number] [phone]  Hepatitis panel 9/12 -  neg  Dysphagia . Secondary to stroke . Dysphagia 1 diet with thin liquid -> NPO due to neuro worsening   IVF @ 50cc total  Speech on board   Other Stroke Risk Factors  Former Cigarette smoker, quit 3 yrs ago  ETOH use  Substance abuse UDS - positive for THC   Obesity, Body mass index is 27.7 kg/m., slightly overweight, recommend weight loss, diet and exercise as appropriate   Other Active Problems  Hypokalemia 3.2 - replaced   Normocytic anemia - hematology felt d/t ongoing blood draws 10.6  Hospital day # 34  Patient has developed brain abscess and cerebritis at the hemicraniectomy site and neurosurgery has aspirated 40 cc purulent/hemorrhagic fluid.  Infectious disease have been consulted and managing antibiotics.  Continue vancomycin and cefepime as he has shown some response.  He remains critically ill and at significant risk for worsening.  I spoke to the patient's wife over the phone and gave her an update about his condition and answered questions.  Discussed with Dr.Comer infectious disease This patient is critically ill and at significant risk of neurological worsening, death and care requires constant monitoring of vital signs, hemodynamics,respiratory and cardiac monitoring, extensive review of multiple databases, frequent neurological assessment, discussion with family, other specialists and medical decision making of high complexity.I have made any additions or clarifications directly to the above note.This critical care time does not reflect procedure time, or teaching time or supervisory time of PA/NP/Med Resident etc but could involve care discussion time.  I spent 30 minutes of neurocritical care time  in the care of  this patient.   Antony Contras, MD     To contact Stroke Continuity provider, please refer to [email or web address] After hours, contact General Neurology

## 2019-08-22 NOTE — Progress Notes (Signed)
Nutrition Follow-up  DOCUMENTATION CODES:   Obesity unspecified  INTERVENTION:   Osmolite 1.5 to 60 ml/hr via Cortrak  Pro-Stat 30 mL to TID Provides 125 g of protein, 2460 kcals, 1094 mL of free water Meets 100% estimated calorie and protein needs  NUTRITION DIAGNOSIS:   Inadequate oral intake related to acute illness as evidenced by NPO status.  Ongoing.   GOAL:   Patient will meet greater than or equal to 90% of their needs  Progressing  MONITOR:   PO intake, Supplement acceptance, Diet advancement, TF tolerance, Weight trends, Labs, I & O's, Skin  REASON FOR ASSESSMENT:   Ventilator, Consult Enteral/tube feeding initiation and management  ASSESSMENT:   43 year old male who presented to the ED on 8/25 with AMS. No known PMH. CT head showed large R hemisphere infarct with edema and mild mass effect.  Pt discussed during ICU rounds and with RN.   8/25 -admit,emergent decompressive hemicraniectomy 8/27 - acute DVT b/l LE, IV placement 8/31 -extubated 9/01 - Cortrak placed, tube tip in stomach 9/15 - MBS with diet advanced to Dysphagia 1 with thin liquids 9/20 - pt transferred to 4N ICU as pt is more lethargic; follow up by SLP pt now NPO; CT showed significant enlarged hemorrhagic conversion on the R side 9/26 - epidural empyema and likely underlying cerebritis.  Medications and labs reviewed   Diet Order:   Diet Order            Diet NPO time specified  Diet effective now              EDUCATION NEEDS:   No education needs have been identified at this time  Skin:  Skin Assessment: Skin Integrity Issues: Skin Integrity Issues: Stage II: L face Incisions: abdomen, head  Last BM:  9/28 rectal tube; volume unknown  Height:   Ht Readings from Last 1 Encounters:  08/02/19 5\' 11"  (1.803 m)    Weight:   Wt Readings from Last 1 Encounters:  08/21/19 90.1 kg    Ideal Body Weight:  78.2 kg  BMI:  Body mass index is 27.7 kg/m.  Estimated  Nutritional Needs:   Kcal:  2300-2500 kcals  Protein:  117-135  Fluid:  >/= 2.1 L/day  Maylon Peppers RD, LDN, CNSC [phone] Pager [phone] After Hours Pager

## 2019-08-22 NOTE — Progress Notes (Signed)
[place] for Infectious Disease   Reason for visit: Follow up on brain abscess  Interval History: fever curve improving, culture from aspirate ngtd.  No acute events.  No associated rash or diarrhea.     Physical Exam: Constitutional:  Vitals:   08/22/19 0900 08/22/19 1000  BP: 129/88 134/84  Pulse:    Resp: 14 18  Temp:    SpO2:     patient appears in NAD Eyes: anicteric, eyes open HENT: swelling on left side of head Respiratory: Normal respiratory effort; CTA B Cardiovascular: RRR GI: soft, nt, nd Neuro: eyes open, tracks, does not respond to commands for me  Review of Systems: Unable to be assessed due to mental status  Lab Results  Component Value Date   WBC 10.0 08/20/2019   HGB 10.6 (L) 08/20/2019   HCT 32.1 (L) 08/20/2019   MCV 86.3 08/20/2019   PLT 218 08/20/2019    Lab Results  Component Value Date   CREATININE 0.71 08/22/2019   BUN 16 08/22/2019   NA 141 08/22/2019   K 3.2 (L) 08/22/2019   CL 111 08/22/2019   CO2 21 (L) 08/22/2019    Lab Results  Component Value Date   ALT 53 (H) 08/22/2019   AST 36 08/22/2019   ALKPHOS 86 08/22/2019     Microbiology: Recent Results (from the past 240 hour(s))  Culture, Urine     Status: Abnormal   Collection Time: 08/13/19  4:27 PM   Specimen: Urine, Clean Catch  Result Value Ref Range Status   Specimen Description URINE, CLEAN CATCH  Final   Special Requests   Final    NONE Performed at [facility], [address] [place], [place] [address]    Culture >=100,000 COLONIES/mL PSEUDOMONAS AERUGINOSA (A)  Final   Report Status 08/15/2019 FINAL  Final   Organism ID, Bacteria PSEUDOMONAS AERUGINOSA (A)  Final      Susceptibility   Pseudomonas aeruginosa - MIC*    CEFTAZIDIME 16 INTERMEDIATE Intermediate     CIPROFLOXACIN 1 SENSITIVE Sensitive     GENTAMICIN <=1 SENSITIVE Sensitive     IMIPENEM 2 SENSITIVE Sensitive     PIP/TAZO 32 SENSITIVE Sensitive     CEFEPIME 16 INTERMEDIATE  Intermediate     * >=100,000 COLONIES/mL PSEUDOMONAS AERUGINOSA  Culture, blood (Routine X 2) w Reflex to ID Panel     Status: None (Preliminary result)   Collection Time: 08/17/19 10:19 AM   Specimen: BLOOD  Result Value Ref Range Status   Specimen Description BLOOD LEFT ANTECUBITAL  Final   Special Requests   Final    BOTTLES DRAWN AEROBIC ONLY Blood Culture adequate volume   Culture   Final    NO GROWTH 4 DAYS Performed at [facility], [address] [place], [place] [address]    Report Status PENDING  Incomplete  Culture, blood (Routine X 2) w Reflex to ID Panel     Status: None (Preliminary result)   Collection Time: 08/17/19 10:27 AM   Specimen: BLOOD LEFT HAND  Result Value Ref Range Status   Specimen Description BLOOD LEFT HAND  Final   Special Requests   Final    BOTTLES DRAWN AEROBIC ONLY Blood Culture results may not be optimal due to an inadequate volume of blood received in culture bottles   Culture   Final    NO GROWTH 4 DAYS Performed at [facility], [address], [place], [place] [address]  Report Status PENDING  Incomplete  Surgical PCR screen     Status: Abnormal   Collection Time: 08/21/19  1:50 AM   Specimen: Nasal Mucosa; Nasal Swab  Result Value Ref Range Status   MRSA, PCR NEGATIVE NEGATIVE Final   Staphylococcus aureus POSITIVE (A) NEGATIVE Final    Comment: CRITICAL RESULT CALLED TO, READ BACK BY AND VERIFIED WITH: RN, TRosanne Sack [id number] @0423  THANEY Performed at [facility] [address] [place], [place] [address]   Body fluid culture     Status: None (Preliminary result)   Collection Time: 08/21/19  8:35 AM   Specimen: Abscess; Body Fluid  Result Value Ref Range Status   Specimen Description ABSCESS EPIDURAL  Final   Special Requests NONE  Final   Gram Stain   Final    MODERATE WBC PRESENT, PREDOMINANTLY PMN NO ORGANISMS SEEN    Culture   Final    NO GROWTH < 24 HOURS Performed at [facility], [address], [place],  [address]    Report Status PENDING  Incomplete    Impression/Plan:  1. Brain abscess - on empiric vancomycin and cefepime and fever curve improving.  More alert and responding to antibiotics. Will continue with vancomycin and cefepime.  Improved on this empiric therapy so will stop metronidazole.   He will need a prolonged course since the abscess has not been completely aspirated out/debrided (40cc aspirated out). Discussed with Dr. Leonie Man  2.  Medication monitoring - will continue to monitor creat, cbc  3.  Cerebritis - as above, from abscess and on appropriate treatment.

## 2019-08-22 NOTE — Progress Notes (Signed)
Physical Therapy Treatment Patient Details Name: Matthew Black MRN: [id number] DOB: 29-Jun-1976 Today's Date: 08/22/2019    History of Present Illness Pt is a 43 y.o. M with no known PMH who presents on 8/25 after being found down at work, nonverbal with L hemiparesis. CT head showing large R hemisphere infarct with edema and mild mass effect. 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 Stable trace midline shift. S/p hemicraniectomy for  edema with abdominal flap implant. BLE DVT vasc US 8/28: acute DVT bilateral lower extremities, s/p IVC placement.  On 9/21 Repeat CT stable large hemorrhagic conversion.    PT Comments    Pt found to have a neck abcess in which 40 cc of infected fluid was drained. Per RN and OT pt much improved today compared to Friday. Pt did sit EOB with minA and follow more commands. Freq increased to provide more therapy to progress patients functional mobility. Acute PT to cont to follow.    Follow Up Recommendations  SNF     Equipment Recommendations  Wheelchair (measurements PT);Wheelchair cushion (measurements PT);Hospital bed;Other (comment)    Recommendations for Other Services       Precautions / Restrictions Precautions Precautions: Fall Precaution Comments: L hemiparesis, R skull missing, abd bone flap Required Braces or Orthoses: Other Brace Other Brace: helmet (not wearing due to presses on edema on R side of head) Restrictions Weight Bearing Restrictions: No    Mobility  Bed Mobility Overal bed mobility: Needs Assistance Bed Mobility: Rolling Rolling: Total assist;+2 for physical assistance Sidelying to sit: Total assist;+2 for physical assistance Supine to sit: Total assist;HOB elevated;+2 for physical assistance Sit to supine: +2 for physical assistance;Total assist;HOB elevated   General bed mobility comments: pt dependent to roll L/R for hygiene s/p large BM, pt then totalAx2 for trunk and LE  mangement to/from EOB  Transfers                 General transfer comment: deferrred due to not having tilt in space w/c available  Ambulation/Gait             General Gait Details: unable   Stairs             Wheelchair Mobility    Modified Rankin (Stroke Patients Only) Modified Rankin (Stroke Patients Only) Pre-Morbid Rankin Score: No symptoms Modified Rankin: Severe disability     Balance Overall balance assessment: Needs assistance Sitting-balance support: Feet supported;Bilateral upper extremity supported Sitting balance-Leahy Scale: Zero Sitting balance - Comments: pt with L lateral lean, varied from min to maxA, able to sit x 10 min, pt did use R UE with push/pull on PT to assist with attempting to maintain balance, pt requires max verbal and tactile cues                                    Cognition Arousal/Alertness: Awake/alert Behavior During Therapy: Flat affect Overall Cognitive Status: Impaired/Different from baseline Area of Impairment: Attention;Following commands;Problem solving                   Current Attention Level: Focused   Following Commands: Follows one step commands inconsistently;Follows one step commands with increased time Safety/Judgement: Decreased awareness of safety;Decreased awareness of deficits Awareness: Intellectual(was incontinent of stool and unable to communicate) Problem Solving: Slow processing;Decreased initiation;Difficulty sequencing;Requires verbal cues;Requires tactile cues General Comments: per OT pt with  significant improvement since friday since they drained the neck abcess      Exercises      General Comments General comments (skin integrity, edema, etc.): VSS      Pertinent Vitals/Pain Pain Assessment: Faces Faces Pain Scale: No hurt Pain Location: grimacing in general with rolling and transfer to EOB Pain Descriptors / Indicators: Grimacing;Discomfort Pain  Intervention(s): Monitored during session    Home Living                      Prior Function            PT Goals (current goals can now be found in the care plan section) Progress towards PT goals: Progressing toward goals    Frequency    Min 3X/week      PT Plan Frequency needs to be updated    Co-evaluation PT/OT/SLP Co-Evaluation/Treatment: Yes Reason for Co-Treatment: Complexity of the patient's impairments (multi-system involvement) PT goals addressed during session: Mobility/safety with mobility        AM-PAC PT "6 Clicks" Mobility   Outcome Measure  Help needed turning from your back to your side while in a flat bed without using bedrails?: Total Help needed moving from lying on your back to sitting on the side of a flat bed without using bedrails?: Total Help needed moving to and from a bed to a chair (including a wheelchair)?: Total Help needed standing up from a chair using your arms (e.g., wheelchair or bedside chair)?: Total Help needed to walk in hospital room?: Total Help needed climbing 3-5 steps with a railing? : Total 6 Click Score: 6    End of Session   Activity Tolerance: Patient tolerated treatment well Patient left: in bed;with call bell/phone within reach;with bed alarm set Nurse Communication: Mobility status PT Visit Diagnosis: Hemiplegia and hemiparesis;Other symptoms and signs involving the nervous system (R29.898);Other abnormalities of gait and mobility (R26.89) Hemiplegia - Right/Left: Left Hemiplegia - dominant/non-dominant: Non-dominant Hemiplegia - caused by: Cerebral infarction     Time: [id number] PT Time Calculation (min) (ACUTE ONLY): 32 min  Charges:  $Therapeutic Activity: 8-22 mins $Neuromuscular Re-education: 8-22 mins                     Kittie Plater, PT, DPT Acute Rehabilitation Services Pager #: [phone] Office #: [phone]    Berline Lopes 08/22/2019, 2:40 PM

## 2019-08-22 NOTE — Progress Notes (Signed)
  NEUROSURGERY PROGRESS NOTE   No issues overnight.   EXAM:  BP 121/85   Pulse (!) 105   Temp 99.9 F (37.7 C) (Axillary)   Resp 14   Ht 5\' 11"  (1.803 m)   Wt 90.1 kg   SpO2 100%   BMI 27.70 kg/m   Awake Following commands with right side Left plegia Crani flap: tense, no active drainage today abd site: c/d/i  IMPRESSION/PLAN 43 y.o. male s/p large right hemispheric stroke, subsequent PE requiring heparin and complicated by hemorrhagic transformation in right temporo-occipital region now presenting with epidural empyema and likely underlying cerebritis. - Frontal subq/epidural abscess aspirated yesterday. Cx sent. Abx per ID - No new NS recs. Continue current care

## 2019-08-23 DIAGNOSIS — Z7189 Other specified counseling: Secondary | ICD-10-CM

## 2019-08-23 DIAGNOSIS — Z515 Encounter for palliative care: Secondary | ICD-10-CM

## 2019-08-23 LAB — BASIC METABOLIC PANEL
Anion gap: 10 (ref 5–15)
BUN: 16 mg/dL (ref 6–20)
CO2: 22 mmol/L (ref 22–32)
Calcium: 8.7 mg/dL — ABNORMAL LOW (ref 8.9–10.3)
Chloride: 105 mmol/L (ref 98–111)
Creatinine, Ser: 0.42 mg/dL — ABNORMAL LOW (ref 0.61–1.24)
GFR calc Af Amer: >60 mL/min (ref >60)
GFR calc non Af Amer: >60 mL/min (ref >60)
Glucose, Bld: 125 mg/dL — ABNORMAL HIGH (ref 70–99)
Potassium: 3.8 mmol/L (ref 3.5–5.1)
Sodium: 137 mmol/L (ref 135–145)

## 2019-08-23 LAB — CBC
HCT: 33 % — ABNORMAL LOW (ref 39.0–52.0)
Hemoglobin: 10.5 g/dL — ABNORMAL LOW (ref 13.0–17.0)
MCH: 27.6 pg (ref 26.0–34.0)
MCHC: 31.8 g/dL (ref 30.0–36.0)
MCV: 86.6 fL (ref 80.0–100.0)
Platelets: 234 10*3/uL (ref 150–400)
RBC: 3.81 MIL/uL — ABNORMAL LOW (ref 4.22–5.81)
RDW: 13.9 % (ref 11.5–15.5)
WBC: 8.5 10*3/uL (ref 4.0–10.5)
nRBC: 0 % (ref 0.0–0.2)

## 2019-08-23 LAB — GLUCOSE, CAPILLARY
Glucose-Capillary: 102 mg/dL — ABNORMAL HIGH (ref 70–99)
Glucose-Capillary: 104 mg/dL — ABNORMAL HIGH (ref 70–99)
Glucose-Capillary: 113 mg/dL — ABNORMAL HIGH (ref 70–99)
Glucose-Capillary: 114 mg/dL — ABNORMAL HIGH (ref 70–99)
Glucose-Capillary: 118 mg/dL — ABNORMAL HIGH (ref 70–99)
Glucose-Capillary: 122 mg/dL — ABNORMAL HIGH (ref 70–99)

## 2019-08-23 LAB — VANCOMYCIN, TROUGH: Vancomycin Tr: 10 ug/mL — ABNORMAL LOW (ref 15–20)

## 2019-08-23 LAB — BODY FLUID CELL COUNT WITH DIFFERENTIAL
Lymphs, Fluid: 2 %
Monocyte-Macrophage-Serous Fluid: 2 % — ABNORMAL LOW (ref 50–90)
Neutrophil Count, Fluid: 96 % — ABNORMAL HIGH (ref 0–25)
Total Nucleated Cell Count, Fluid: 38250 cu mm — ABNORMAL HIGH (ref 0–1000)

## 2019-08-23 LAB — VANCOMYCIN, PEAK: Vancomycin Pk: 20 ug/mL — ABNORMAL LOW (ref 30–40)

## 2019-08-23 LAB — PATHOLOGIST SMEAR REVIEW

## 2019-08-23 MED ORDER — BACLOFEN 1 MG/ML ORAL SUSPENSION
10.0000 mg | Freq: Two times a day (BID) | ORAL | Status: DC
  Administered 2019-08-23 – 2019-08-25 (×6): 10 mg
  Filled 2019-08-23 (×8): qty 1

## 2019-08-23 MED ORDER — VANCOMYCIN HCL 10 G IV SOLR
1250.0000 mg | Freq: Three times a day (TID) | INTRAVENOUS | Status: DC
  Administered 2019-08-23 – 2019-08-31 (×23): 1250 mg via INTRAVENOUS
  Filled 2019-08-23 (×25): qty 1250

## 2019-08-23 NOTE — Progress Notes (Signed)
Pharmacy Antibiotic Note  Matthew Black is a 43 y.o. 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  Pharmacy has been consulted for Vancomycin dosing.  Some question as to whether this should be treated as a meningitis/CSF involvement with trough targeting vs more of a SSTI/abscess indication with AUC targeting. Given the patient's abscess is in the epidural space and there is mention of cerebritis I will target a trough of 15-20. The patient had levels today that came back as a peak of 20 and trough of 10. He is currently on 1000 mg q8h hours and the levels were drawn appropriately. His Scr is stable and UOP is good, so I will increase his dose to 1250 mg q8h. This gives a predicted trough of 12.5, I want him to prove he can tolerate 1250 mg q8h before I bump him all the way up to 1500 mg q8h.  Plan: Increase to Vancomycin 1250 mg IV q8h  Monitor renal function, clinical status, vanc levels  Height: 5\' 11"  (180.3 cm) Weight: 198 lb 13.7 oz (90.2 kg) IBW/kg (Calculated) : 75.3  Temp (24hrs), Avg:99.4 F (37.4 C), Min:98.8 F (37.1 C), Max:100.2 F (37.9 C)  Recent Labs  Lab 08/17/19 0500 08/18/19 0435 08/19/19 0517 08/20/19 0411 08/22/19 0442 08/23/19 0800 08/23/19 1330  WBC 17.1* 16.6* 12.5* 10.0  --  8.5  --   CREATININE 0.90 1.07 1.07 0.93 0.71 0.42*  --   LATICACIDVEN  --  0.7  --   --   --   --   --   VANCOTROUGH  --   --   --   --   --   --  10*  VANCOPEAK  --   --   --   --   --  20*  --     Estimated Creatinine Clearance: 129.4 mL/min (A) (by C-G formula based on SCr of 0.42 mg/dL (L)).    Allergies  Allergen Reactions  . Hydrocodone Nausea Only    Antimicrobials this admission: Zosyn 9/21>>9/26 Vanc 9/26 >>  Cefepime 9/26 >>   Thank you for allowing pharmacy to be a part of this patient's care.  Nicoletta Dress, PharmD PGY2 Infectious Disease Pharmacy Resident  Please see AMION for all Pharmacists' Contact Phone Numbers 08/23/2019, 3:00 PM

## 2019-08-23 NOTE — Progress Notes (Signed)
[place] for Infectious Disease   Reason for visit: Follow up on brain abscess  Interval History: fever curve continues to improve, no acute events.  No growth on cultures. More alert per neurology. No associated rash or diarrhea.     Physical Exam: Constitutional:  Vitals:   08/23/19 0800 08/23/19 0900  BP: 128/81 131/84  Pulse: 90 91  Resp: 20 19  Temp: 100.2 F (37.9 C)   SpO2: 100% 100%   patient appears in NAD Eyes: anicteric, eyes open HENT: swelling on left side of head Respiratory: Normal respiratory effort; CTA B Cardiovascular: RRR GI: soft, nt, nd Neuro: eyes open, tracks, does not respond to commands for me  Review of Systems: Unable to be assessed due to mental status  Lab Results  Component Value Date   WBC 8.5 08/23/2019   HGB 10.5 (L) 08/23/2019   HCT 33.0 (L) 08/23/2019   MCV 86.6 08/23/2019   PLT 234 08/23/2019    Lab Results  Component Value Date   CREATININE 0.42 (L) 08/23/2019   BUN 16 08/23/2019   NA 137 08/23/2019   K 3.8 08/23/2019   CL 105 08/23/2019   CO2 22 08/23/2019    Lab Results  Component Value Date   ALT 53 (H) 08/22/2019   AST 36 08/22/2019   ALKPHOS 86 08/22/2019     Microbiology: Recent Results (from the past 240 hour(s))  Culture, Urine     Status: Abnormal   Collection Time: 08/13/19  4:27 PM   Specimen: Urine, Clean Catch  Result Value Ref Range Status   Specimen Description URINE, CLEAN CATCH  Final   Special Requests   Final    NONE Performed at [facility], [address] [place], [place] [address]    Culture >=100,000 COLONIES/mL PSEUDOMONAS AERUGINOSA (A)  Final   Report Status 08/15/2019 FINAL  Final   Organism ID, Bacteria PSEUDOMONAS AERUGINOSA (A)  Final      Susceptibility   Pseudomonas aeruginosa - MIC*    CEFTAZIDIME 16 INTERMEDIATE Intermediate     CIPROFLOXACIN 1 SENSITIVE Sensitive     GENTAMICIN <=1 SENSITIVE Sensitive     IMIPENEM 2 SENSITIVE Sensitive     PIP/TAZO 32  SENSITIVE Sensitive     CEFEPIME 16 INTERMEDIATE Intermediate     * >=100,000 COLONIES/mL PSEUDOMONAS AERUGINOSA  Culture, blood (Routine X 2) w Reflex to ID Panel     Status: None   Collection Time: 08/17/19 10:19 AM   Specimen: BLOOD  Result Value Ref Range Status   Specimen Description BLOOD LEFT ANTECUBITAL  Final   Special Requests   Final    BOTTLES DRAWN AEROBIC ONLY Blood Culture adequate volume   Culture   Final    NO GROWTH 5 DAYS Performed at [facility], [address] [place], [place] [address]    Report Status 08/22/2019 FINAL  Final  Culture, blood (Routine X 2) w Reflex to ID Panel     Status: None   Collection Time: 08/17/19 10:27 AM   Specimen: BLOOD LEFT HAND  Result Value Ref Range Status   Specimen Description BLOOD LEFT HAND  Final   Special Requests   Final    BOTTLES DRAWN AEROBIC ONLY Blood Culture results may not be optimal due to an inadequate volume of blood received in culture bottles   Culture   Final    NO GROWTH 5 DAYS Performed at [facility], [place],  [place] [address]    Report Status 08/22/2019 FINAL  Final  Surgical PCR screen     Status: Abnormal   Collection Time: 08/21/19  1:50 AM   Specimen: Nasal Mucosa; Nasal Swab  Result Value Ref Range Status   MRSA, PCR NEGATIVE NEGATIVE Final   Staphylococcus aureus POSITIVE (A) NEGATIVE Final    Comment: CRITICAL RESULT CALLED TO, READ BACK BY AND VERIFIED WITH: RN, TRosanne Sack [id number] @0423  THANEY Performed at [facility] [address], [place], [place] [address]   Body fluid culture     Status: None (Preliminary result)   Collection Time: 08/21/19  8:35 AM   Specimen: Abscess; Body Fluid  Result Value Ref Range Status   Specimen Description ABSCESS EPIDURAL  Final   Special Requests NONE  Final   Gram Stain   Final    MODERATE WBC PRESENT, PREDOMINANTLY PMN NO ORGANISMS SEEN    Culture   Final    NO GROWTH 2 DAYS Performed at [facility], [address], [place], [place] [address]    Report Status PENDING  Incomplete    Impression/Plan:  1. Brain abscess - he continues on empiric antibiotics and fever curve improving still.  No acute changes.  Abscess culture ngtd.   Discussed with Dr. Leonie Man  2.  Medication monitoring - vancomycin peak noted.  Continue with monitoring per pharmacy.    3.  Cerebritis - a result from #1. Continue with antibiotics.

## 2019-08-23 NOTE — Progress Notes (Signed)
STROKE TEAM PROGRESS NOTE   INTERVAL HISTORY Patient continues to have low-grade fever though his neurological exam remains unchanged.  He is alert and follows commands on the right side continues to have right gaze preference, left visual field loss and dense left hemiplegia.  Palliative care team plan to meet with patient's wife and discuss goals of care today.  Brain fluid cultures have been negative so far  Vitals:   08/23/19 0600 08/23/19 0700 08/23/19 0800 08/23/19 0900  BP: 130/83 (!) 113/91 128/81 131/84  Pulse: 86 90 90 91  Resp: (!) 22 (!) 21 20 19   Temp:   100.2 F (37.9 C)   TempSrc:   Axillary   SpO2: 99% 100% 100% 100%  Weight:      Height:        CBC:  Recent Labs  Lab 08/20/19 0411 08/23/19 0800  WBC 10.0 8.5  HGB 10.6* 10.5*  HCT 32.1* 33.0*  MCV 86.3 86.6  PLT 218 [id number]    Basic Metabolic Panel:  Recent Labs  Lab 08/22/19 0442 08/23/19 0800  NA 141 137  K 3.2* 3.8  CL 111 105  CO2 21* 22  GLUCOSE 126* 125*  BUN 16 16  CREATININE 0.71 0.42*  CALCIUM 7.8* 8.7*    IMAGING Ct Head Wo Contrast Ct Cervical Spine Wo Contrast  07/19/2019 1132 1. 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 2. No associated hemorrhage and mild intracranial mass effect at this time, including trace leftward midline shift. 3. Evidence of large vessel occlusion: Hyperdensity of the right ICA terminus, the right A1 and MCA. 4. Unaffected brain parenchyma appears negative. 5.  No acute traumatic injury identified in the cervical spine.   Mr Brain 44 Contrast Mr Angio Head Wo Contrast Mr Angio Neck Wo Contrast 07/19/2019 1330 1. 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 The right ICA terminus, right ACA and MCA also appear occluded. 2. 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 Cytotoxic edema but no convincing hemorrhage. Stable mild mass effect with trace leftward midline shift. 3. Intracranial  MRA is degraded by motion artifact, but no other large vessel occlusion is suspected. There appears to be lobes reperfusion of the right PCA. 4. The left hemisphere and posterior fossa brain parenchyma appears normal.   Ct Head Wo Contrast 07/19/2019 1532 Similar appearance to earlier. Low-density and swelling of the right hemisphere in the ACA and MCA territories with mass effect and right-to-left shift of 5-6 mm. No hemorrhagic transformation at this time.   Ct Head Wo Contrast 07/20/2019 0444 1. Acute right ACA and MCA territory infarct with progressive swelling that has decompressed through the craniectomy defect. 2. No acute hemorrhage or new infarction.   Ct Angio Head W Or Wo Contrast Ct Angio Neck W Or Wo Contrast 07/21/2019 1. Stable from prior MRA. 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 No evidence of atherosclerosis or vasculopathy in the other vessels. 2. Cytotoxic edema causes 5 mm of midline shift and brain bulging through the craniectomy defect. Mild petechial hemorrhage is seen at the basal ganglia.   Ct Head Wo Contrast 07/23/2019 1. Unchanged appearance of massive right MCA and ACA territory infarcts with parenchyma extending through decompressive craniectomy. 2. Small focus of suspected hemorrhage adjacent to the right caudate head.  07/30/2019 1. Decreasing mass effect within large right anterior MCA and ACA territory infarct. 2.  Midline shift is no longer present. There is decreased effacement of the right lateral ventricle. 3. Infarcted brain tissue still herniates through the craniectomy site. 4. New parenchymal hemorrhage within the infarcted tissue anteriorly measures 2.5 x 2.3 x 2.7 cm. 5. No new infarct.  08/01/2019  1. Stable head CT since 07/30/2019. 2. Again noted is a large infarct involving the right MCA and right ACA territories with brain tissue herniating through the right craniotomy defect. 3. Parenchymal  hemorrhage in the right anterior cortex has minimally changed. Stable petechial hemorrhage in the right basal ganglia region.   08/11/2019 1. New patchy parenchymal hemorrhages along the periphery of the infarcted RIGHT frontoparietal lobe, compatible with hemorrhagic conversion, largest focus underlying the RIGHT frontal bone measures 1.3 cm greatest dimension. 2. Previously described dominant focus of hemorrhage along the anterior margin of the craniectomy site has decreased in size and density compared to previous exams, compatible with expected evolution. 3. Stable herniation of infarcted brain through the RIGHT frontal-parietal-temporal craniectomy site. 4. No additional mass effect or midline shift on today's exam.   Ct Angio Chest Pe W Or Wo Contrast 08/01/2019 Nonocclusive thrombus seen within the left posterior lower lobe segmental artery.    [place] ABD Limited 08/11/2019 Suspected mildly complex seroma versus liquified hematoma in the superficial soft tissues of the RIGHT abdomen, measuring 7.9 cardiomyopathy greatest dimension, surrounding patient's calvarium (location of patient's skull status post partial craniectomy), appearance less suggestive of abscess. The collection seen today by ultrasound is similar to the appearance of the collection on earlier CT abdomen of 08/04/2019.  2D Echocardiogram 07/20/2019  1. The left ventricle has normal systolic function with an ejection fraction of 60-65%. The cavity size was normal. Left ventricular diastolic parameters were normal.  2. The right ventricle has normal systolic function. The cavity was normal. There is no increase in right ventricular wall thickness.  3. The pericardial effusion is circumferential.  4. Trivial pericardial effusion is present.  5. The mitral valve is grossly normal.  6. The tricuspid valve is grossly normal.  7. The aortic valve is tricuspid. No stenosis of the aortic valve.  8. The aorta is normal unless otherwise  noted.  9. The aortic root is normal in size and structure. 10. No cardiac source of embolism identified. 11. When compared to the prior study: No comparison.  LE Dopplers 08/02/2019 Right: Findings consistent with acute deep vein thrombosis involving the right peroneal veins.  Left: Findings consistent with acute deep vein thrombosis involving the left common femoral vein, left femoral vein, left proximal profunda vein, left popliteal vein, left posterior tibial veins, and left peroneal veins. Extending up into left iliac vein  and IVC. 07/21/2019 Right: Findings consistent with acute deep vein thrombosis involving the right peroneal veins. Left: Findings consistent with acute deep vein thrombosis involving the left popliteal vein, left posterior tibial veins, and left peroneal veins.  Vas [place] Transcranial Doppler W Bubbles 07/25/2019 No HITS heard heard at rest. No HITS heard heard during valsalva.   2D echo w/bubble 08/03/2019 Limited bubble study for shunt. No evidence for atrial level right to left shunt.  Ct Abdomen Pelvis W Contrast 08/04/2019 1. Large fluid collection in the lower right anterior abdominal wall subcutaneous fat with what appears to be an internal surgical drain, favored to represent a postoperative hematoma or proteinaceous seroma, however, the possibility of an abscess is not excluded in light of the patient's fever.  2. Small high attenuation fluid collection along the left pelvic  sidewall which likely represents a hematoma, as above.  3. Additional incidental findings, as above.  Ct Head Wo Contrast 08/14/2019 1. Significant increase in the amount of acute hemorrhage along the periphery of patient's infarcted RIGHT hemisphere, most prominently developed adjacent to the posterior margin of the craniectomy site. There is associated increase in mass effect causing increased herniation through the craniectomy defect. The mass effect is also causing increased compression of  the RIGHT lateral ventricle.  2. New intraventricular extension of the acute hemorrhage.  3. No appreciable change of the mild leftward midline shift. No evidence of tonsillar herniation.   CT Head WO Contrast 08/15/19 1. Postoperative changes from prior right craniectomy with extensive mass effect and herniation of the infarcted brain through the craniectomy defect, similar to previous. 2. Associated hemorrhagic transformation within the infarcted right cerebral hemisphere with dominant hematoma measuring up to 10.1 x 5.5 x 5.9 cm, slightly worsened from previous. Associated intraventricular extension and mass effect on the right lateral ventricle without worsening hydrocephalus or ventricular trapping, similar to previous. 3. No other new acute intracranial abnormality.  Ct Head Wo Contrast 08/16/2019 1. Hemorrhagic right ACA and MCA territory infarcts with swollen brain bulging through the craniectomy defect. Intraventricular hemorrhage with ventriculomegaly. No change from 2 days ago.   CXR 08/14/2019 1. Lungs are clear.  No evidence of pneumonia or aspiration 2. Stable position of the support apparatus. 08/15/2019 Stable support apparatus. No acute cardiopulmonary abnormality seen. 08/17/2019 No significant change in AP portable examination. No acute airspace Opacity.  Ct Head Wo Contrast 08/18/2019 1. Lateral ventriculomegaly appears somewhat increased from prior examination. Consider short interval CT follow-up. 2. Hemorrhagic right ACA and MCA territory infarcts with right cerebral swelling and external herniation through right-sided craniectomy defect, similar to prior exam. Intraventricular hemorrhage has not significantly changed in amount, although with some interval redistribution. 3. Unchanged 7 mm rightward midline shift related to right-sided external herniation.  Ct Head Wo Contrast 08/20/2019 1. No significant interval change since previous exam from 08/18/2019. 2.  Continued interval evolution of hemorrhagic right ACA and MCA territory infarcts with extensive edema and herniation through the right craniectomy defect. 3. Irregular collections about the craniectomy site with question of disruption of the underlying dura, which could reflect sequelae of underlying CSF leak. Correlation with physical exam and fluid sampling suggested for further evaluation. 4. No significant interval change in intraventricular hemorrhage. 5. Similar 6 mm left-to-right shift related to herniation. Basilar cisterns remain patent. 6. No other new acute intracranial abnormality.  MRI Brain W &  WO Contrast 08/20/19 Right hemispheric infarction.  Decompressive craniectomy with herniation of brain into the craniectomy site.  Overlying subgaleal fluid collection on the right. Large irregularly enhancing fluid collection along the anterior margin of the craniectomy measuring approximately 5.4 x 9.8 cm. This may represent a large abscess. Large area of hemorrhage in the right temporoparietal infarct also shows peripheral enhancement. This process may be related to sterile hematoma versus infected hematoma. Mild ventricular enlargement, stable   PHYSICAL EXAM     General- Well nourished, well developed, middle-aged African-American male. He has right hemicraniectomy surgical incision on the scalp, bulging.  Right head bulging from crani site.  Ophthalmologic- fundi not visualized due to noncooperation.  Cardiovascular - Regular rhythm and rate.  Neuro - patient with open eyes. Nonverbal,  following commands on the right side and moves right upper and lower extremity purposefully to command.  Eyes in right gaze position, does not cross midline, not blinking to  visual threat to the left, PERRL. Left facial droop. Tongue protrusion not corporative. Has spontaneous movement of RUE against gravity and RLE in bed 3/5 and moving spontaneously. On pain stimulation, mild withdraw of LLE, but  no movement of LUE. DTR 1+ and toes equiv. Sensation, coordination and gait not tested.   ASSESSMENT/PLAN Matthew Black is a 43 y.o. male with no significant past medical history found down x 2 days nonverbal with L hemiparesis.   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 Trace L midline shift. ELVO at R ICA, R A1, R MCA.  MRI  Large R brain infarct sparing R PCA. Cytotoxic edema but no hemorrhage. Stable trace midline shift.  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 R ICA terminus, R ACA, R MCA occluded.   CT head similar w/ low density and sweddling R ACA and MCA territories, now with 5-46mm midline shift. no hemorrhage  CT head acute R ACA and MCA infarct w/ progressive swelling decompressed through craniectomy defect.   CTA head and neck stable MLS. R ICA occlusion in neck that continues into R ACA and MCA. Mild petechial hemorrhage at basal ganglia  CT Head 8/29 - Unchanged appearance of massive right MCA and ACA territory infarcts with parenchyma extending through decompressive craniectomy. Small focus of suspected hemorrhage adjacent to the right caudate head.  CT head 07/30/19 - New hematoma within the infarcted tissue   CT head 08/01/2019 stable w/o change  CT head 08/11/19 - new patchy hemorrhage along periphery of infarcted R frontoparietal lobe (likely HT) decreased dominant anterior margin hemorrhage. Stable herniation of infarcted brain through crani site.   CT head 9/20 - worsening with increased HT on the right and new IVH  CT head 08/15/19 - slightly worsened HT from previous.   CT head 9/22 hemorrhagic R ACA and MCA territory infarcts with edematous brain bulging  through Crani defect.  No change x2 days  CT head 9/24 stable hematoma, increased right hemisphere mass-effect, external herniation from Crani site, making right lateral ventriculomegaly than prior images.  2D Echo EF 60-65%  2D echo w/ bubble ordered no LRS   LE venous doppler DVT in R peroneal, L popliteal, L posterior tibial and L peroneal  LE venous doppler repeat DVT in R peroneal, L popliteal, L posterior tibial and L peroneal  TCD bubble no HITS, no PFO  LDL 83  HgbA1c 5.4  UDS positive THC  Hypercoagulable and autoimmune work up negative  Add Heparin 5000 units sq tid for VTE prophylaxis  No antithrombotic prior to admission, treated DVT with heparin IV, then  discontinued due to hemorrhagic conversion with right frontal HT, then resumed heparin IV given extensive DVT and PE, heparin IV again discontinued due to worsening HT  Therapy recommendations:  SNF (reassessed by CIR and declined 9/16)  Disposition:  pending   Right ICA occlusion  MRA and CTA showed right ICA occlusion from origin to terminal  Wife denies any head trauma  Wife stated that pt had recent aggressive exercise with weight lifting  Concerning dissection as working diagnosis of stroke etiology   Cyctotoxic cerebral edema  S/p R decompressive hemicraniectomy (Nundkumar)  w/ flap R abd 07/19/2019  PICC placed - keep for now per Leonie Man  Given One dose of 23.4% 8/26; 3%  off 8/30 1700  CT repeat 07/30/19 - New parenchymal hemorrhage within the infarcted tissue  Off helmet to further release pressure  CT repeat 08/01/2019 stable w/o change  CT head repeat 08/11/19 - new patchy hemorrhage along periphery of infarcted R frontoparietal lobe (likely HT) but previous right frontal hematoma now much reduced in size  CT head 9/20 worsening right HT with new ICH and IVH  CT Head 9/21 - slightly worsened HT from previous.   CT head 9/22 hemorrhagic R ACA and MCA territory infarcts with edematous brain  bulging through Crani defect.  No change x2 days  CT head 9/24 stable hematoma, increased right hemisphere mass-effect, external herniation from Crani site, making right lateral ventriculomegaly than prior images. - discussed with Dr. 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 Correlation with physical exam and fluid sampling suggested for further evaluation.  MRI W&WO - 08/20/19 - ...herniation of brain into the craniectomy site. Large irregularly enhancing fluid collection along the anterior margin of the craniectomy measuring approximately 5.4 x 9.8 cm. This may represent a large abscess....sterile hematoma versus infected hematoma.   Dr Kathyrn Sheriff aspirated 9/27 and sent for cx. Not a surgical candidate d/t ris increasing herniation. .  Cx moderate WBC, no organisms - no growth < 24h  Tmax - 101.6  WBC - 10.0  ID consulted   Now on Maxipime, Vancomycin. Flagyl stopped  Poor prognosis. Palliative Care consulted  SIRS/fever/leukocytosis due to necrotic brain tissue   Tmax - 101.6  WBC - 10.0  CXR x 3 NAD  Blood cultures - neg, repeated again 08/04/19 -> no growth on empiric unasyn -> changed to vanco, cefepime and flagyl 08/05/19->now off  Lactic acid 1.2->2.0  Hepatitis panel 9/12 - neg  procalcitonin and lactic acid normal   As no longer a central neurogenic fever continue weaning:  Decreased baclofen 10 mg bid, bromocriptine 5 mg q8h, and d/c buspar  B LE DVT Small LLL PE  LE venous doppler DVT in R peroneal, L popliteal, L posterior tibial and L peroneal  Etiology unclear  Started IV heparin 07/26/2019  IVC filter placed 8/27. Plan retrieval in 8-12 weeks as an IP in an IR clinic  Hypercoagulable work up negative   LE venous  doppler 08/02/19 repeat extensive DVT in R peroneal, L popliteal, L posterior tibial and L peroneal up to the IVC filter  CT chest Nonocclusive thrombus seen within the left posterior lower lobe segmental artery.   Treated with IV Heparin but then off secondary to intracranial hemorrhage, then restarted IV heparin given extensive DVT and PE but again stopped due to worsening HT on 9/20  Hematology consult, appreciate help - hypercoag w/u neg.  At risk for recurrent clots   Factor V Leiden, homocysteine, Prot C&S, Prothombin gene mutation all neg.   Abdominal wall hematoma  Large fluid collection seen on CT abdomen  CT abd/pel concerning for abdominal wall fluid collection likely old hematoma  Dr. Kathyrn Sheriff did fluid aspiration and seems to be old blood component  Fluid aspiration sent for culture 9/11 - no growth < 24 hrs  Repeat ABD [place] complex seroma vs liquified hematoma R  abd similar to previous  Seizure-like activity  EEG continuous slowing, excessive beta activity, sleep spindle asymmetry decreased R->related to stroke and sedation. No SZ  Long Term EEG cortical dysfunction in right frontal region  Seizure precautions  Continue Keppra   UTI, resolved  Urine culture pseudomonas - treated with  to zosyn  UA 9/21 - WBC 0-5 negative   UA 9/23 - WBC 11-20  Repeat B Cx no growth 5 days  Tachycardia, resolved  HR normal now  Likely due to fever  Hydration with IVF, TF and free water  On metoprolol to 50 Q8h  EKG ST, rate 120 w/ possible LA enlargement  Troponin series neg x 3    Hyperlipidemia  Home meds:  no statin  LDL 83, goal < 70  Was on lipitor 20 mg daily   D/c statin due to elevated LFT  Consider statin at d/c if LFTs down  Elevated LFT, almost resolved  AST - 105-118-98-85-38-36  ALT - 80-68-65-164-64-53  CK [id number] [phone]  Hepatitis panel 9/12 - neg  Dysphagia . Secondary to stroke . Dysphagia 1 diet with thin liquid ->  NPO due to neuro worsening   IVF @ 50cc total  Speech to reassess today   Other Stroke Risk Factors  Former Cigarette smoker, quit 3 yrs ago  ETOH use  Substance abuse UDS - positive for THC   Obesity, Body mass index is 27.73 kg/m., slightly overweight, recommend weight loss, diet and exercise as appropriate   Other Active Problems  Hypokalemia 3.2 - replaced   Normocytic anemia - hematology felt d/t ongoing blood draws Hgb 10.5  Hospital day # 35  Patient remains critically ill due to brain abscess/cerebritis in his hemicraniectomy site.  He is on vancomycin and cefepime and seems to have shown some initial response neurological exam is slightly improved in the last few days.  I had a long discussion with the patient's wife yesterday and she wanted to continue aggressive treatment options.  Palliative care team plan to meet with wife today.  Discussed with Dr. Rowe Pavy.  Continue ongoing present treatment.  Appreciate neurosurgery help This patient is critically ill and at significant risk of neurological worsening, death and care requires constant monitoring of vital signs, hemodynamics,respiratory and cardiac monitoring, extensive review of multiple databases, frequent neurological assessment, discussion with family, other specialists and medical decision making of high complexity.I have made any additions or clarifications directly to the above note.This critical care time does not reflect procedure time, or teaching time or supervisory time of PA/NP/Med Resident etc but could involve care discussion time.  I spent 30 minutes of neurocritical care time  in the care of  this patient.       Antony Contras, MD     To contact Stroke Continuity provider, please refer to [email or web address] After hours, contact General Neurology

## 2019-08-23 NOTE — Consult Note (Signed)
Consultation Note Date: 08/23/2019   Patient Name: Matthew Black  DOB: 05-30-1976  MRN: [id number]  Age / Sex: 43 y.o., male  PCP: Colon Branch, MD Referring Physician: Garvin Fila, MD  Reason for Consultation: Establishing goals of care  HPI/Patient Profile: 43 y.o. male   admitted on 07/19/2019    Clinical Assessment and Goals of Care: 43 year old gentleman with no significant past medical history found down for 2 days nonverbal with left-sided weakness.  Patient has been admitted to the hospital for stroke right middle cerebral artery/ACA infarct.  He had significant cerebral edema and is status post hemicraniectomy with abdominal flap implant.  Hospital course complicated by hemorrhagic conversion with hematoma 9-5- 2020 and worsening on 9-20 in the right infarct area.  Serial brain imaging has been done.  Right-sided ICA occlusion and cytotoxic cerebral edema has been identified.  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  Sent for cultures and patient on broad-spectrum antibiotics.  Hospital course also complicated by abdominal wall hematoma, small PE and bilateral lower extremity DVT and seizure-like activities.  A palliative consult has been requested for goals of care discussions.  Patient is awake reasonably alert.  He follows some commands as bedside nurses attempted to give him a bath.  He has right-sided gaze preference.  He does not appear to be in acute distress.  He does not verbalize.  No family present at the bedside.  Call placed and discussed with wife. Palliative medicine is specialized medical care for people living with serious illness. It focuses on providing relief from the symptoms and stress of a serious illness. The goal is to improve quality of life for both the patient and the family.  Goals of care: Broad aims of medical therapy in relation to  the patient's values and preferences. Our aim is to provide medical care aimed at enabling patients to achieve the goals that matter most to them, given the circumstances of their particular medical situation and their constraints.   Goals wishes and values important to the patient and family as a unit attempted to be explored.  Discussed about the patient's current condition prolonged hospitalization and several active problems that place him at high risk for decline.  Provided active listening and supportive care to the wife over the phone.  She continues to endorse full code/full scope and remains hopeful that the patient will have ongoing stabilization/recovery.  Looked up pertinent medical information and shared with her as per her request, and with regards to the patient's fever curve, recommendations from infectious disease and his most recent culture data set.  NEXT OF KIN  wife, has 2 kids.   SUMMARY OF RECOMMENDATIONS    full code, full scope as per discussions with wife. Continue current mode of care PMT will follow peripherally, unless patient has sudden worsening/decompensation in this hospital course.  Thank you for the consult.   Code Status/Advance Care Planning:  Full code    Symptom Management:    as  above   Palliative Prophylaxis:   Delirium Protocol  Additional Recommendations (Limitations, Scope, Preferences):  Full Scope Treatment  Psycho-social/Spiritual:   Desire for further Chaplaincy support:yes  Additional Recommendations: Caregiving  Support/Resources  Prognosis:   Unable to determine  Discharge Planning: To Be Determined      Primary Diagnoses: Present on Admission: . Stroke (cerebrum) ([place])   I have reviewed the medical record, interviewed the patient and family, and examined the patient. The following aspects are pertinent.  Past Medical History:  Diagnosis Date  . 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   Social History   Socioeconomic  History  . Marital status: Married    Spouse name: Not on file  . Number of children: 2  . Years of education: Not on file  . Highest education level: Not on file  Occupational History  . Occupation: Scientist, research (medical)  . Financial resource strain: Not on file  . Food insecurity    Worry: Not on file    Inability: Not on file  . Transportation needs    Medical: Not on file    Non-medical: Not on file  Tobacco Use  . Smoking status: Former Smoker    Packs/day: 0.00    Quit date: 11/24/2015    Years since quitting: 3.7  . Smokeless tobacco: Never Used  . Tobacco comment:    Substance and Sexual Activity  . Alcohol use: Yes    Comment: occasionally  . Drug use: Never  . Sexual activity: Not on file  Lifestyle  . Physical activity    Days per week: Not on file    Minutes per session: Not on file  . Stress: Not on file  Relationships  . Social Herbalist on phone: Not on file    Gets together: Not on file    Attends religious service: Not on file    Active member of club or organization: Not on file    Attends meetings of clubs or organizations: Not on file    Relationship status: Not on file  Other Topics Concern  . Not on file  Social History Narrative   Household-- pt, wife, 2 children   Family History  Problem Relation Age of Onset  . Diabetes Other        GF  . Healthy Mother   . Healthy Father    Scheduled Meds: . acetaminophen  650 mg Oral Q4H   Or  . acetaminophen (TYLENOL) oral liquid 160 mg/5 mL  650 mg Per Tube Q4H   Or  . acetaminophen  650 mg Rectal Q4H  . baclofen  10 mg Per Tube BID  . bromocriptine  5 mg Per Tube Q8H  . chlorhexidine  15 mL Mouth Rinse BID  . Chlorhexidine Gluconate Cloth  6 each Topical Daily  . Chlorhexidine Gluconate Cloth  6 each Topical Daily  . famotidine  20 mg Per Tube BID  . feeding supplement (PRO-STAT SUGAR FREE 64)  30 mL Per Tube TID  . heparin injection (subcutaneous)  5,000 Units  Subcutaneous Q8H  . levETIRAcetam  500 mg Per Tube BID  . mouth rinse  15 mL Mouth Rinse q12n4p  . metoprolol tartrate  50 mg Per Tube Q8H  . mupirocin ointment  1 application Nasal BID  . sodium chloride flush  10-40 mL Intracatheter Q12H   Continuous Infusions: . sodium chloride 40 mL/hr at 08/23/19 1200  . sodium chloride Stopped (08/21/19 2124)  . sodium chloride  Stopped (08/23/19 0945)  . ceFEPime (MAXIPIME) IV Stopped (08/23/19 1028)  . feeding supplement (OSMOLITE 1.5 CAL) 1,000 mL (08/23/19 0210)  . vancomycin Stopped (08/23/19 0621)   PRN Meds:.sodium chloride, sodium chloride, labetalol, ondansetron (ZOFRAN) IV, senna-docusate, sodium chloride flush, traMADol Medications Prior to Admission:  Prior to Admission medications   Medication Sig Start Date End Date Taking? Authorizing Provider  ibuprofen (ADVIL,MOTRIN) 800 MG tablet Take 1 tablet (800 mg total) by mouth 3 (three) times daily. Patient not taking: Reported on 07/19/2019 01/23/19   Raylene Everts, MD  oseltamivir (TAMIFLU) 75 MG capsule Take 1 capsule (75 mg total) by mouth every 12 (twelve) hours. Patient not taking: Reported on 07/19/2019 01/23/19   Raylene Everts, MD   Allergies  Allergen Reactions  . Hydrocodone Nausea Only   Review of Systems  Physical Exam Alert Follows commands with bedside nursing R gaze preference No edema Regular pattern of breathing S1 S2  Vital Signs: BP 124/90   Pulse 91   Temp 100.2 F (37.9 C) (Axillary)   Resp 14   Ht 5\' 11"  (1.803 m)   Wt 90.2 kg   SpO2 100%   BMI 27.73 kg/m  Pain Scale: CPOT   Pain Score: Asleep   SpO2: SpO2: 100 % O2 Device:SpO2: 100 % O2 Flow Rate: .O2 Flow Rate (L/min): 3 L/min  IO: Intake/output summary:   Intake/Output Summary (Last 24 hours) at 08/23/2019 1235 Last data filed at 08/23/2019 1200 Gross per 24 hour  Intake 2827.36 ml  Output 2575 ml  Net 252.36 ml    LBM: Last BM Date: 08/23/19 Baseline Weight: Weight: 102.5  kg(from 2019 records, must be updated) Most recent weight: Weight: 90.2 kg     Palliative Assessment/Data:   PPS 20%  Time In:  9 Time Out:  10 Time Total:  60 min.  Greater than 50%  of this time was spent counseling and coordinating care related to the above assessment and plan.  Signed by: Loistine Chance, MD  [id number] Please contact Palliative Medicine Team phone at [phone] for questions and concerns.  For individual provider: See Shea Evans

## 2019-08-23 NOTE — Progress Notes (Signed)
Serosanginous/purulent drainage noted from R posterior head, moderate amount. Torrie Mayers PA notified.  Will monitor site.

## 2019-08-24 LAB — GLUCOSE, CAPILLARY
Glucose-Capillary: 105 mg/dL — ABNORMAL HIGH (ref 70–99)
Glucose-Capillary: 110 mg/dL — ABNORMAL HIGH (ref 70–99)
Glucose-Capillary: 114 mg/dL — ABNORMAL HIGH (ref 70–99)
Glucose-Capillary: 115 mg/dL — ABNORMAL HIGH (ref 70–99)
Glucose-Capillary: 117 mg/dL — ABNORMAL HIGH (ref 70–99)
Glucose-Capillary: 119 mg/dL — ABNORMAL HIGH (ref 70–99)

## 2019-08-24 LAB — BODY FLUID CULTURE: Culture: NO GROWTH

## 2019-08-24 MED ORDER — CHLORHEXIDINE GLUCONATE CLOTH 2 % EX PADS
6.0000 | MEDICATED_PAD | Freq: Every day | CUTANEOUS | Status: DC
  Administered 2019-08-26 – 2019-09-06 (×13): 6 via TOPICAL

## 2019-08-24 NOTE — Progress Notes (Signed)
Chaplain visited with the patient and the wife in order to let them know that we are here to support them.  The wife asked the patient if they would like to hear a prayer and to respond with a thumbs up.  The patient clearly responded and the chaplain offered a prayer.  The wife responded with gratitude.  The chaplain will follow-up as needed.  Brion Aliment Chaplain Resident For questions concerning this note please contact me by pager [phone]

## 2019-08-24 NOTE — Progress Notes (Signed)
  Speech Language Pathology Treatment: (caregiver education)  Patient Details Name: JEANLUC KELLEN MRN: [id number] DOB: Dec 02, 1975 Today's Date: 08/24/2019 Time: 1400-1430 SLP Time Calculation (min) (ACUTE ONLY): 30 min  Assessment / Plan / Recommendation Clinical Impression  Pt's wife arrived after session. This Probation officer provided information on session and current recommendation to continue ice chips at bedside to preserve swallow musculature. At this time, pt doesn't clinically appear ready for repeat instrumental study given current cognitive deficits, increased oral holding as trials progressed in session. Pt's wife had multiple questions about frequency of therapy sessions, which SLP would provide treatment next and despite education and support, she appeared largely unsatisfied with explanation and rationale provided. This Probation officer made pt's nurse and MD aware of wife's concerns. ST to continue to follow.    HPI HPI: Pt is a 43 y.o. with no known PMH who presents on 8/25 after being found down at work, nonverbal with L hemiparesis. 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 Stable trace midline shift. S/p hemicraniectomy with abdominal flap implant. Intubated 8/26-8/31. CXR worsened atelectasis in the right lower lobe. MBS completed 08/09/2019 with recommendation for Dys1/thin liquids. Pt demonstrated increased lethargy and vomiting. Hemorrhagic conversion on CT, transferred back to ICU. Head CT 08/14/19 with increase in hemorrhage, edema, mass effect and compression of ventricle      SLP Plan  Continue with current plan of care       Recommendations  Diet recommendations: NPO Medication Administration: Via alternative means                Oral Care Recommendations: Oral care prior to ice chip/H20;Oral care QID Follow up Recommendations: Skilled Nursing facility SLP Visit Diagnosis: Dysphagia, unspecified (R13.10) Plan: Continue with current  plan of care       GO                Stpehanie Montroy 08/24/2019, 3:00 PM

## 2019-08-24 NOTE — Progress Notes (Signed)
Physical Therapy Treatment Patient Details Name: Matthew Black MRN: [id number] DOB: [birth year] Today's Date: 08/24/2019    History of Present Illness Pt is a 43 y.o. M with no known PMH who presents on 8/25 after being found down at work, nonverbal with L hemiparesis. CT head showing large R hemisphere infarct with edema and mild mass effect. 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 Stable trace midline shift. S/p hemicraniectomy for  edema with abdominal flap implant. BLE DVT vasc US 8/28: acute DVT bilateral lower extremities, s/p IVC placement.  On 9/21 Repeat CT stable large hemorrhagic conversion.    PT Comments    Pt with improved command follow today and ability to focus on PT. Pt remains non-verbal and unable to initiate movement in L UE and LE. Pt con't to require maxAx2 for transfer to EOB. Pt much more alert and participatory in therapy session today. If patient continues to progress will re-consider CIR upon d/c. Acute PT to cont to follow.    Follow Up Recommendations  SNF     Equipment Recommendations  Wheelchair (measurements PT);Wheelchair cushion (measurements PT);Hospital bed;Other (comment)    Recommendations for Other Services       Precautions / Restrictions Precautions Precautions: Fall Precaution Comments: L hemiparesis, R skull missing, abd bone flap Required Braces or Orthoses: Other Brace Other Brace: helmet (not wearing due to presses on edema on R side of head) Restrictions Weight Bearing Restrictions: No    Mobility  Bed Mobility Overal bed mobility: Needs Assistance Bed Mobility: Rolling Rolling: Max assist;+2 for physical assistance Sidelying to sit: Max assist;+2 for physical assistance   Sit to supine: Total assist;+2 for physical assistance   General bed mobility comments: pt con't to required maxA for trunk elevation and LE management, pt did initiated R LE management towards EOB with amx  directional verbal cues however no trunk initiation  Transfers                 General transfer comment: educated RN to use maxisky when wife comes to transfer to chair so wife can provide 24/7 supervision while pt in chair  Ambulation/Gait                 Stairs             Wheelchair Mobility    Modified Rankin (Stroke Patients Only) Modified Rankin (Stroke Patients Only) Pre-Morbid Rankin Score: No symptoms Modified Rankin: Severe disability     Balance Overall balance assessment: Needs assistance Sitting-balance support: Feet supported;Bilateral upper extremity supported Sitting balance-Leahy Scale: Poor Sitting balance - Comments: pt with L lateral lean, pt with brief episodes of minA to maintain EOb however required maxA to maintain dynamic EOB balance. Pt kicked R LE x 5 reps, none on L LE, and was able to grasp pen in R field vision , pt appears to cross midline when tracking however doesn't reach for pen until it's in the R field of vision Postural control: Left lateral lean                                  Cognition Arousal/Alertness: Awake/alert Behavior During Therapy: Flat affect Overall Cognitive Status: Impaired/Different from baseline Area of Impairment: Following commands;Problem solving;Attention                   Current Attention Level: Sustained   Following Commands:  Follows one step commands with increased time;Follows one step commands inconsistently(followed commands majority of time, 80%) Safety/Judgement: Decreased awareness of deficits;Decreased awareness of safety Awareness: Intellectual Problem Solving: Slow processing;Decreased initiation;Difficulty sequencing;Requires verbal cues;Requires tactile cues General Comments: pt with increased consistent simple command follow today, increased ability to focus on therapist, remains to have minimal initiation and assist for transfers, pt remains non-verbal       Exercises      General Comments General comments (skin integrity, edema, etc.): VSS, pt with L LE rigidity vs increased tone      Pertinent Vitals/Pain Pain Assessment: Faces Faces Pain Scale: No hurt    Home Living                      Prior Function            PT Goals (current goals can now be found in the care plan section) Acute Rehab PT Goals Patient Stated Goal: unable Progress towards PT goals: Progressing toward goals    Frequency    Min 3X/week      PT Plan Frequency needs to be updated    Co-evaluation              AM-PAC PT "6 Clicks" Mobility   Outcome Measure  Help needed turning from your back to your side while in a flat bed without using bedrails?: Total Help needed moving from lying on your back to sitting on the side of a flat bed without using bedrails?: Total Help needed moving to and from a bed to a chair (including a wheelchair)?: Total Help needed standing up from a chair using your arms (e.g., wheelchair or bedside chair)?: Total Help needed to walk in hospital room?: Total Help needed climbing 3-5 steps with a railing? : Total 6 Click Score: 6    End of Session Equipment Utilized During Treatment: (TBD at next venue) Activity Tolerance: Patient tolerated treatment well Patient left: in bed;with call bell/phone within reach;with bed alarm set Nurse Communication: Mobility status PT Visit Diagnosis: Hemiplegia and hemiparesis;Other symptoms and signs involving the nervous system (R29.898);Other abnormalities of gait and mobility (R26.89) Hemiplegia - Right/Left: Left Hemiplegia - dominant/non-dominant: Non-dominant Hemiplegia - caused by: Cerebral infarction     Time: [id number] PT Time Calculation (min) (ACUTE ONLY): 25 min  Charges:  $Therapeutic Activity: 8-22 mins $Neuromuscular Re-education: 8-22 mins                     Kittie Plater, PT, DPT Acute Rehabilitation Services Pager #: [phone] Office #:  [phone]    Berline Lopes 08/24/2019, 1:25 PM

## 2019-08-24 NOTE — Procedures (Signed)
PROCEDURE: Percutaneous aspiration of cranial epidural abscess  SURGEON: Dr. Consuella Lose, MD  SPECIMENS: Abscess aspirate  DRAINS: Non  COMPLICATIONS: None  CONDITION: Hemodynamically stable  FINDINGS: Approx 40cc bloody purulent fluid with significant debris aspirated  INDICATIONS: Matthew Black is a 43 y.o. man with hx of stroke requiring hemicraniectomy. More recently he was found to have purulent drainage from his wound with MRI suggesting an epidural abscess at the site of craniectomy. With the risk for further extracranial herniation with more radical open debridement, we elected to proceed with percutaneous aspiration.  DESCRIPTION OF PROCEDURE: The right frontal scalp was prepped with chlorhexidine. 18G needle was then introduced subcutaneously and approximately 40cc of blood purulent fluid was aspirated. Sterile dressing was applied. Aspirate was sent for culture in sterile tubes. Pt tolerated the procedure well.

## 2019-08-24 NOTE — Progress Notes (Signed)
[facility] for Infectious Disease   Reason for visit: Follow up on brain abscess  Interval History: has been afebrile since last eve, WBC wnl, no acute events.  No history obtainable from the patient   Physical Exam: Constitutional:  Vitals:   08/24/19 0700 08/24/19 0800  BP: 117/84 124/89  Pulse: 88 89  Resp: 18 14  Temp:  98.7 F (37.1 C)  SpO2: 100% 100%   patient appears in NAD Eyes: anicteric, eyes open HENT: swelling on left side of head Respiratory: Normal respiratory effort; CTA B Cardiovascular: RRR  Review of Systems: Unable to be assessed due to mental status  Lab Results  Component Value Date   WBC 8.5 08/23/2019   HGB 10.5 (L) 08/23/2019   HCT 33.0 (L) 08/23/2019   MCV 86.6 08/23/2019   PLT 234 08/23/2019    Lab Results  Component Value Date   CREATININE 0.42 (L) 08/23/2019   BUN 16 08/23/2019   NA 137 08/23/2019   K 3.8 08/23/2019   CL 105 08/23/2019   CO2 22 08/23/2019    Lab Results  Component Value Date   ALT 53 (H) 08/22/2019   AST 36 08/22/2019   ALKPHOS 86 08/22/2019     Microbiology: Recent Results (from the past 240 hour(s))  Culture, blood (Routine X 2) w Reflex to ID Panel     Status: None   Collection Time: 08/17/19 10:19 AM   Specimen: BLOOD  Result Value Ref Range Status   Specimen Description BLOOD LEFT ANTECUBITAL  Final   Special Requests   Final    BOTTLES DRAWN AEROBIC ONLY Blood Culture adequate volume   Culture   Final    NO GROWTH 5 DAYS Performed at [facility], [address] [place], [place] [address]    Report Status 08/22/2019 FINAL  Final  Culture, blood (Routine X 2) w Reflex to ID Panel     Status: None   Collection Time: 08/17/19 10:27 AM   Specimen: BLOOD LEFT HAND  Result Value Ref Range Status   Specimen Description BLOOD LEFT HAND  Final   Special Requests   Final    BOTTLES DRAWN AEROBIC ONLY Blood Culture results may not be optimal due to an inadequate volume of blood received in  culture bottles   Culture   Final    NO GROWTH 5 DAYS Performed at [facility], [employer] [address] [place], [place] [address]    Report Status 08/22/2019 FINAL  Final  Surgical PCR screen     Status: Abnormal   Collection Time: 08/21/19  1:50 AM   Specimen: Nasal Mucosa; Nasal Swab  Result Value Ref Range Status   MRSA, PCR NEGATIVE NEGATIVE Final   Staphylococcus aureus POSITIVE (A) NEGATIVE Final    Comment: CRITICAL RESULT CALLED TO, READ BACK BY AND VERIFIED WITH: RN, TRosanne Sack [id number] @[id number]  THANEY Performed at [facility] [address], [place], [place] [address]   Body fluid culture     Status: None   Collection Time: 08/21/19  8:35 AM   Specimen: Abscess; Body Fluid  Result Value Ref Range Status   Specimen Description ABSCESS EPIDURAL  Final   Special Requests NONE  Final   Gram Stain   Final    MODERATE WBC PRESENT, PREDOMINANTLY PMN NO ORGANISMS SEEN    Culture   Final    NO GROWTH 3 DAYS Performed at [facility], [address] [place], [place]  [id number]    Report Status 08/24/2019 FINAL  Final    Impression/Plan:  1. Brain abscess - on empiric vancomycin and cefepime.  He will need this for a prolonged course of 6-8 weeks.  Duration will depend on reimaging later.     2.  Medication monitoring - stable creat on vancomycin.  No changes.    Dr. Tommy Medal to take over tomorrow.

## 2019-08-24 NOTE — Progress Notes (Signed)
STROKE TEAM PROGRESS NOTE   INTERVAL HISTORY Patient continues to have low-grade fever but neurologically is awake alert interactive and follows commands consistently.  Vital signs are stable.  Drain fluid cultures yet remain negative.  He remains on vancomycin and cefepime.  He had again had some serosanguineous discharge through the hemicraniectomy wound site yesterday.  He has a fresh bandage on it today  Vitals:   08/24/19 0600 08/24/19 0610 08/24/19 0700 08/24/19 0800  BP: 118/81 118/81 117/84 124/89  Pulse: 87 87 88 89  Resp: (!) '25  18 14  ' Temp:      TempSrc:      SpO2: 100%  100% 100%  Weight:      Height:        CBC:  Recent Labs  Lab 08/20/19 0411 08/23/19 0800  WBC 10.0 8.5  HGB 10.6* 10.5*  HCT 32.1* 33.0*  MCV 86.3 86.6  PLT 218 151    Basic Metabolic Panel:  Recent Labs  Lab 08/22/19 0442 08/23/19 0800  NA 141 137  K 3.2* 3.8  CL 111 105  CO2 21* 22  GLUCOSE 126* 125*  BUN 16 16  CREATININE 0.71 0.42*  CALCIUM 7.8* 8.7*    IMAGING past 24h No results found.   PHYSICAL EXAM      General- Well nourished, well developed, middle-aged African-American male. He has right hemicraniectomy surgical incision on the scalp, bulging.  Right head bulging from crani site.  Bandage on the surgical site.  Ophthalmologic- fundi not visualized due to noncooperation.  Cardiovascular - Regular rhythm and rate.  Neuro - patient with open eyes. Nonverbal,  following commands on the right side and moves right upper and lower extremity purposefully to command.  Eyes in right gaze position, does not cross midline, not blinking to visual threat to the left, PERRL. Left facial droop. Tongue protrusion not corporative. Has spontaneous movement of RUE against gravity and RLE in bed 3/5 and moving spontaneously. On pain stimulation, mild withdraw of LLE, but no movement of LUE. DTR 1+ and toes equiv. Sensation, coordination and gait not tested.   ASSESSMENT/PLAN Mr.  Matthew Black is a 43 y.o. male with no significant past medical history found down x 2 days nonverbal with L hemiparesis.   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 Trace L midline shift. ELVO at R ICA, R A1, R MCA.  MRI  Large R brain infarct sparing R PCA. Cytotoxic edema but no hemorrhage. Stable trace midline shift.  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 R ICA terminus, R ACA, R MCA occluded.   CT head similar w/ low density and sweddling R ACA and MCA territories, now with 5-76m midline shift. no hemorrhage  CT head acute R ACA and MCA infarct w/ progressive swelling decompressed through craniectomy defect.   CTA head and neck stable MLS. R ICA occlusion in neck that continues into R ACA and MCA. Mild petechial hemorrhage at basal ganglia  CT Head 8/29 - Unchanged appearance of massive right MCA and ACA territory infarcts with parenchyma extending through decompressive craniectomy. Small focus of suspected hemorrhage adjacent to the right caudate head.  CT head  07/30/19 - New hematoma within the infarcted tissue   CT head 08/01/2019 stable w/o change  CT head 08/11/19 - new patchy hemorrhage along periphery of infarcted R frontoparietal lobe (likely HT) decreased dominant anterior margin hemorrhage. Stable herniation of infarcted brain through crani site.   CT head 9/20 - worsening with increased HT on the right and new IVH  CT head 08/15/19 - slightly worsened HT from previous.   CT head 9/22 hemorrhagic R ACA and MCA territory infarcts with edematous brain bulging through Crani defect.  No change x2 days  CT head 9/24 stable hematoma, increased right hemisphere mass-effect,  external herniation from Crani site, making right lateral ventriculomegaly than prior images.  2D Echo EF 60-65%  2D echo w/ bubble ordered no LRS   LE venous doppler DVT in R peroneal, L popliteal, L posterior tibial and L peroneal  LE venous doppler repeat DVT in R peroneal, L popliteal, L posterior tibial and L peroneal  TCD bubble no HITS, no PFO  LDL 83  HgbA1c 5.4  UDS positive THC  Hypercoagulable and autoimmune work up negative  Add Heparin 5000 units sq tid for VTE prophylaxis  No antithrombotic prior to admission, treated DVT with heparin IV, then  discontinued due to hemorrhagic conversion with right frontal HT, then resumed heparin IV given extensive DVT and PE, heparin IV again discontinued due to worsening HT  Therapy recommendations:  SNF (reassessed by CIR and declined 9/16)  Disposition:  Pending  Palliative Care consulted met with wife 9/29 - wife wants full aggressive care   Transfer to progressive unit (order placed9/29). 4NP being considered by 4N and MD  Transfer back to [facility] as attending  Right ICA occlusion  MRA and CTA showed right ICA occlusion from origin to terminal  Wife denies any head trauma  Wife stated that pt had recent aggressive exercise with weight lifting  Concerning dissection as working diagnosis of stroke etiology   Cyctotoxic cerebral edema  S/p R decompressive hemicraniectomy (Nundkumar) w/ flap R abd 07/19/2019  PICC placed - keep for now per Leonie Man  Given One dose of 23.4% 8/26; 3%  off 8/30 1700  CT repeat 07/30/19 - New parenchymal hemorrhage within the infarcted tissue  Off helmet to further release pressure  CT repeat 08/01/2019 stable w/o change  CT head repeat 08/11/19 - new patchy hemorrhage along periphery of infarcted R frontoparietal lobe (likely HT) but previous right frontal hematoma now much reduced in size  CT head 9/20 worsening right HT with new ICH and IVH  CT Head 9/21 - slightly worsened HT from  previous.   CT head 9/22 hemorrhagic R ACA and MCA territory infarcts with edematous brain bulging through Crani defect.  No change x2 days  CT head 9/24 stable hematoma, increased right hemisphere mass-effect, external herniation from Crani site, making right lateral ventriculomegaly than prior images. - discussed with Dr. 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 Correlation with physical exam and fluid sampling suggested for further evaluation.  MRI W&WO - 08/20/19 - ...herniation of brain into the craniectomy site. Large irregularly enhancing fluid collection along the anterior margin of the craniectomy measuring approximately 5.4 x 9.8 cm. This may represent a large abscess....sterile hematoma versus infected hematoma.   Dr Kathyrn Sheriff aspirated 9/27 and sent  for cx. Not a surgical candidate d/t ris increasing herniation. .  Cx moderate WBC, no organisms - no growth < 24h  Tmax - 101.6  WBC - 10.0  ID consulted   Maxipime, Vancomycin 9/26>> (plan tx x 6-8 weeks, duration to depend on reimaging)  SIRS/fever/leukocytosis due to necrotic brain tissue   Tmax - 100.5  WBC - 10.0  CXR x 3 NAD  Blood cultures - neg, repeated again 08/04/19 -> no growth on empiric unasyn -> changed to vanco, cefepime and flagyl 08/05/19->now off  Lactic acid 1.2->2.0  Hepatitis panel 9/12 - neg  procalcitonin and lactic acid normal   As no longer a central neurogenic fever, weaning:  Decreased baclofen 10 mg bid, bromocriptine 5 mg q8h, and d/c buspar - continue for 2-3 days then d/c baclofen completely  B LE DVT Small LLL PE  LE venous doppler DVT in R peroneal, L popliteal, L posterior tibial and L peroneal  Etiology unclear  Started IV heparin  07/26/2019  IVC filter placed 8/27. Plan retrieval in 8-12 weeks as an IP in an IR clinic  Hypercoagulable work up negative   LE venous doppler 08/02/19 repeat extensive DVT in R peroneal, L popliteal, L posterior tibial and L peroneal up to the IVC filter  CT chest Nonocclusive thrombus seen within the left posterior lower lobe segmental artery.   Treated with IV Heparin but then off secondary to intracranial hemorrhage, then restarted IV heparin given extensive DVT and PE but again stopped due to worsening HT on 9/20  Hematology consult, appreciate help - hypercoag w/u neg.  At risk for recurrent clots   Factor V Leiden, homocysteine, Prot C&S, Prothombin gene mutation all neg.   Abdominal wall hematoma  Large fluid collection seen on CT abdomen  CT abd/pel concerning for abdominal wall fluid collection likely old hematoma  Dr. Kathyrn Sheriff did fluid aspiration and seems to be old blood component  Fluid aspiration sent for culture 9/11 - no growth < 24 hrs  Repeat ABD [place] complex seroma vs liquified hematoma R abd similar to previous  Seizure-like activity  EEG continuous slowing, excessive beta activity, sleep spindle asymmetry decreased R->related to stroke and sedation. No SZ  Long Term EEG cortical dysfunction in right frontal region  Seizure precautions  Continue Keppra   UTI, resolved  Urine culture pseudomonas - treated with  to zosyn  UA 9/21 - WBC 0-5 negative   UA 9/23 - WBC 11-20  Repeat B Cx no growth  Tachycardia, resolved  HR normal now  Likely due to fever  Hydration with IVF, TF and free water  On metoprolol to 50 Q8h  EKG ST, rate 120 w/ possible LA enlargement  Troponin series neg x 3    Hyperlipidemia  Home meds:  no statin  LDL 83, goal < 70  Was on lipitor 20 mg daily   D/c statin due to elevated LFT  Consider statin at d/c if LFTs down  Elevated LFT, almost resolved  AST - 105-118-98-85-38-36  ALT - 80-68-65-164-64-53  CK  [phone] [phone]  Hepatitis panel 9/12 - neg  Dysphagia . Secondary to stroke . Dysphagia 1 diet with thin liquid -> NPO due to neuro worsening   IVF @ 50cc total  Speech to reassess as pt improves   Other Stroke Risk Factors  Former Cigarette smoker, quit 3 yrs ago  ETOH use  Substance abuse UDS - positive for THC   Obesity, Body mass index is 28.41 kg/m., slightly overweight,  recommend weight loss, diet and exercise as appropriate   Other Active Problems  Hypokalemia 3.2 - replaced   Normocytic anemia - hematology felt d/t ongoing blood draws Hgb 10.5  Hospital day # 36 Continue vancomycin and cefepime follow-up brain abscess/cerebritis and pharmacy following dosing.  Mobilize out of bed.  Therapy consults.  Transfer out of ICU to stepdown unit when bed available.  No family available at the bedside at this time for discussion. This patient is critically ill and at significant risk of neurological worsening, death and care requires constant monitoring of vital signs, hemodynamics,respiratory and cardiac monitoring, extensive review of multiple databases, frequent neurological assessment, discussion with family, other specialists and medical decision making of high complexity.I have made any additions or clarifications directly to the above note.This critical care time does not reflect procedure time, or teaching time or supervisory time of PA/NP/Med Resident etc but could involve care discussion time.  I spent 30 minutes of neurocritical care time  in the care of  this patient.      Antony Contras, MD     To contact Stroke Continuity provider, please refer to [email or web address] After hours, contact General Neurology

## 2019-08-24 NOTE — Progress Notes (Signed)
  Speech Language Pathology Treatment: Dysphagia  Patient Details Name: Matthew Black MRN: [id number] DOB: [birth year] Today's Date: 08/24/2019 Time: [id number] SLP Time Calculation (min) (ACUTE ONLY): 15 min  Assessment / Plan / Recommendation Clinical Impression  Skilled treatment session focused on dysphagia goals. SLP recieved pt with eyes open with right gaze preference. Pt made good eye contact with this writer during session however he remained nonverbal throughout session and was not able to imitate any oral movements with Max A visual, tactile and verbal stimulation. SLP provided oral care via suction toothbrush with pt intermittently biting brush. SLP provided skilled observation of pt consuming individual ice chips. Initially, pt demonstrated awareness of ice chips as evidenced by mastication. SLP able to feel hyoid movement at neck but movement became more delayed as trials progressed. No overt s/s of aspiration were observed but airway protection cannot be assessed at bedside. Would continue ice chips at bedside for perseveration of swallow musculature. Given the above, pt doesn't appear clinically ready for instrumental study at this time. ST will continue to follow pt and nursing is aware of session recommendations.    HPI HPI: Pt is a 43 y.o. with no known PMH who presents on 8/25 after being found down at work, nonverbal with L hemiparesis. 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 Stable trace midline shift. S/p hemicraniectomy with abdominal flap implant. Intubated 8/26-8/31. CXR worsened atelectasis in the right lower lobe. MBS completed 08/09/2019 with recommendation for Dys1/thin liquids. Pt demonstrated increased lethargy and vomiting. Hemorrhagic conversion on CT, transferred back to ICU. Head CT 08/14/19 with increase in hemorrhage, edema, mass effect and compression of ventricle      SLP Plan  Continue with current plan of care        Recommendations  Diet recommendations: NPO Medication Administration: Via alternative means                Oral Care Recommendations: Oral care prior to ice chip/H20;Oral care QID Follow up Recommendations: Skilled Nursing facility SLP Visit Diagnosis: Dysphagia, unspecified (R13.10) Plan: Continue with current plan of care       GO                Marwa Fuhrman 08/24/2019, 2:48 PM

## 2019-08-25 ENCOUNTER — Inpatient Hospital Stay (HOSPITAL_COMMUNITY): Payer: Medicaid Other

## 2019-08-25 DIAGNOSIS — N390 Urinary tract infection, site not specified: Secondary | ICD-10-CM

## 2019-08-25 DIAGNOSIS — L02811 Cutaneous abscess of head [any part, except face]: Secondary | ICD-10-CM

## 2019-08-25 DIAGNOSIS — G060 Intracranial abscess and granuloma: Secondary | ICD-10-CM

## 2019-08-25 LAB — GLUCOSE, CAPILLARY
Glucose-Capillary: 111 mg/dL — ABNORMAL HIGH (ref 70–99)
Glucose-Capillary: 112 mg/dL — ABNORMAL HIGH (ref 70–99)
Glucose-Capillary: 113 mg/dL — ABNORMAL HIGH (ref 70–99)
Glucose-Capillary: 116 mg/dL — ABNORMAL HIGH (ref 70–99)
Glucose-Capillary: 125 mg/dL — ABNORMAL HIGH (ref 70–99)
Glucose-Capillary: 132 mg/dL — ABNORMAL HIGH (ref 70–99)

## 2019-08-25 LAB — BASIC METABOLIC PANEL
Anion gap: 10 (ref 5–15)
BUN: 14 mg/dL (ref 6–20)
CO2: 22 mmol/L (ref 22–32)
Calcium: 8.6 mg/dL — ABNORMAL LOW (ref 8.9–10.3)
Chloride: 103 mmol/L (ref 98–111)
Creatinine, Ser: 0.63 mg/dL (ref 0.61–1.24)
GFR calc Af Amer: >60 mL/min (ref >60)
GFR calc non Af Amer: >60 mL/min (ref >60)
Glucose, Bld: 122 mg/dL — ABNORMAL HIGH (ref 70–99)
Potassium: 3.3 mmol/L — ABNORMAL LOW (ref 3.5–5.1)
Sodium: 135 mmol/L (ref 135–145)

## 2019-08-25 LAB — CBC
HCT: 32.8 % — ABNORMAL LOW (ref 39.0–52.0)
Hemoglobin: 10.6 g/dL — ABNORMAL LOW (ref 13.0–17.0)
MCH: 27.7 pg (ref 26.0–34.0)
MCHC: 32.3 g/dL (ref 30.0–36.0)
MCV: 85.6 fL (ref 80.0–100.0)
Platelets: 278 10*3/uL (ref 150–400)
RBC: 3.83 MIL/uL — ABNORMAL LOW (ref 4.22–5.81)
RDW: 14.1 % (ref 11.5–15.5)
WBC: 8.3 10*3/uL (ref 4.0–10.5)
nRBC: 0 % (ref 0.0–0.2)

## 2019-08-25 LAB — VANCOMYCIN, TROUGH: Vancomycin Tr: 14 ug/mL — ABNORMAL LOW (ref 15–20)

## 2019-08-25 LAB — VANCOMYCIN, PEAK: Vancomycin Pk: 14 ug/mL — ABNORMAL LOW (ref 30–40)

## 2019-08-25 MED ORDER — POTASSIUM CHLORIDE 10 MEQ/50ML IV SOLN
10.0000 meq | Freq: Once | INTRAVENOUS | Status: AC
  Administered 2019-08-25: 10 meq via INTRAVENOUS

## 2019-08-25 MED ORDER — POTASSIUM CHLORIDE 10 MEQ/100ML IV SOLN
10.0000 meq | INTRAVENOUS | Status: DC
  Administered 2019-08-25 (×3): 10 meq via INTRAVENOUS
  Filled 2019-08-25 (×3): qty 100

## 2019-08-25 MED ORDER — RESOURCE THICKENUP CLEAR PO POWD
ORAL | Status: DC | PRN
  Filled 2019-08-25 (×2): qty 125

## 2019-08-25 NOTE — Progress Notes (Signed)
Pharmacy Antibiotic Note  Matthew Black is a 43 y.o. 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  Pharmacy has been consulted for Vancomycin dosing.  Given the patient's abscess is in the epidural space and there is mention of cerebritis we will target a trough of 15-20. The patient had levels today that came back as a trough of 14 at 0500, then dose 1250 mg given from 0700 to 0830, then Vanc peak drawn late at 1130 (sat in lab and placed on analyzer at 1330); peak came back also at 14. He is currently on 1250 mg q8h hours and the levels the trough level was drawn appropriately (peak was very late and sat in the lab). His Scr is stable and UOP is good, so we will continue his dose at 1250 mg q8h assuming that he will continue to accumulate.   Plan: Continue Vancomycin at 1250 mg IV q8h  Monitor renal function, clinical status, vanc levels  Height: 5\' 11"  (180.3 cm) Weight: 203 lb 7.8 oz (92.3 kg) IBW/kg (Calculated) : 75.3  Temp (24hrs), Avg:98.7 F (37.1 C), Min:97.7 F (36.5 C), Max:99.7 F (37.6 C)  Recent Labs  Lab 08/19/19 0517 08/20/19 0411 08/22/19 0442 08/23/19 0800 08/23/19 1330 08/25/19 0524 08/25/19 1036  WBC 12.5* 10.0  --  8.5  --  8.3  --   CREATININE 1.07 0.93 0.71 0.42*  --  0.63  --   VANCOTROUGH  --   --   --   --  10* 14*  --   VANCOPEAK  --   --   --  20*  --   --  14*    Estimated Creatinine Clearance: 141.1 mL/min (by C-G formula based on SCr of 0.63 mg/dL).    Allergies  Allergen Reactions  . Hydrocodone Nausea Only    Antimicrobials this admission: Zosyn 9/21>>9/26 Vanc 9/26 >>  Cefepime 9/26 >>   Thank you for allowing pharmacy to be a part of this patient's care.  Alanda Slim, PharmD, [facility] Clinical Pharmacist Please see AMION for all Pharmacists' Contact Phone Numbers 08/25/2019, 2:10 PM

## 2019-08-25 NOTE — Progress Notes (Signed)
Subjective: Sitting in a chair and interactive  Antibiotics:  Anti-infectives (From admission, onward)   Start     Dose/Rate Route Frequency Ordered Stop   08/23/19 2200  vancomycin (VANCOCIN) 1,250 mg in sodium chloride 0.9 % 250 mL IVPB     1,250 mg 166.7 mL/hr over 90 Minutes Intravenous Every 8 hours 08/23/19 1539     08/22/19 0930  metroNIDAZOLE (FLAGYL) IVPB 500 mg  Status:  Discontinued     500 mg 100 mL/hr over 60 Minutes Intravenous Every 8 hours 08/22/19 0926 08/22/19 1056   08/20/19 2200  vancomycin (VANCOCIN) IVPB 1000 mg/200 mL premix  Status:  Discontinued     1,000 mg 200 mL/hr over 60 Minutes Intravenous Every 8 hours 08/20/19 1348 08/23/19 1539   08/20/19 1800  ceFEPIme (MAXIPIME) 2 g in sodium chloride 0.9 % 100 mL IVPB     2 g 200 mL/hr over 30 Minutes Intravenous Every 8 hours 08/20/19 1717     08/20/19 1400  vancomycin (VANCOCIN) 2,000 mg in sodium chloride 0.9 % 500 mL IVPB     2,000 mg 250 mL/hr over 120 Minutes Intravenous  Once 08/20/19 1348 08/20/19 1718   08/20/19 1400  ceFEPIme (MAXIPIME) 2 g in sodium chloride 0.9 % 100 mL IVPB  Status:  Discontinued     2 g 200 mL/hr over 30 Minutes Intravenous Every 8 hours 08/20/19 1348 08/20/19 1717   08/15/19 1100  piperacillin-tazobactam (ZOSYN) IVPB 3.375 g     3.375 g 12.5 mL/hr over 240 Minutes Intravenous Every 8 hours 08/15/19 1029 08/20/19 1200   08/05/19 1000  vancomycin (VANCOCIN) 1,250 mg in sodium chloride 0.9 % 250 mL IVPB  Status:  Discontinued     1,250 mg 166.7 mL/hr over 90 Minutes Intravenous Every 12 hours 08/04/19 1856 08/08/19 0914   08/05/19 0600  ceFEPIme (MAXIPIME) 2 g in sodium chloride 0.9 % 100 mL IVPB  Status:  Discontinued     2 g 200 mL/hr over 30 Minutes Intravenous Every 8 hours 08/04/19 1856 08/08/19 0914   08/04/19 2200  metroNIDAZOLE (FLAGYL) IVPB 500 mg  Status:  Discontinued     500 mg 100 mL/hr over 60 Minutes Intravenous Every 8 hours 08/04/19 1743 08/06/19 1548    08/04/19 1830  vancomycin (VANCOCIN) 2,000 mg in sodium chloride 0.9 % 500 mL IVPB     2,000 mg 250 mL/hr over 120 Minutes Intravenous  Once 08/04/19 1805 08/04/19 2053   08/04/19 1815  ceFEPIme (MAXIPIME) 2 g in sodium chloride 0.9 % 100 mL IVPB     2 g 200 mL/hr over 30 Minutes Intravenous  Once 08/04/19 1805 08/04/19 1842   07/31/19 2200  Ampicillin-Sulbactam (UNASYN) 3 g in sodium chloride 0.9 % 100 mL IVPB  Status:  Discontinued     3 g 200 mL/hr over 30 Minutes Intravenous Every 6 hours 07/31/19 1401 08/04/19 1743   07/31/19 1415  Ampicillin-Sulbactam (UNASYN) 3 g in sodium chloride 0.9 % 100 mL IVPB     3 g 200 mL/hr over 30 Minutes Intravenous STAT 07/31/19 1401 07/31/19 1551   07/19/19 2315  bacitracin 50,000 Units in sodium chloride 0.9 % 500 mL irrigation  Status:  Discontinued       As needed 07/19/19 2315 07/19/19 2339      Medications: Scheduled Meds: . acetaminophen  650 mg Oral Q4H   Or  . acetaminophen (TYLENOL) oral liquid 160 mg/5 mL  650 mg Per Tube Q4H  Or  . acetaminophen  650 mg Rectal Q4H  . baclofen  10 mg Per Tube BID  . bromocriptine  5 mg Per Tube Q8H  . chlorhexidine  15 mL Mouth Rinse BID  . Chlorhexidine Gluconate Cloth  6 each Topical Daily  . famotidine  20 mg Per Tube BID  . feeding supplement (PRO-STAT SUGAR FREE 64)  30 mL Per Tube TID  . heparin injection (subcutaneous)  5,000 Units Subcutaneous Q8H  . levETIRAcetam  500 mg Per Tube BID  . mouth rinse  15 mL Mouth Rinse q12n4p  . metoprolol tartrate  50 mg Per Tube Q8H  . mupirocin ointment  1 application Nasal BID  . sodium chloride flush  10-40 mL Intracatheter Q12H   Continuous Infusions: . sodium chloride 40 mL/hr at 08/25/19 1000  . sodium chloride Stopped (08/21/19 2124)  . sodium chloride Stopped (08/23/19 0945)  . ceFEPime (MAXIPIME) IV 2 g (08/25/19 1137)  . feeding supplement (OSMOLITE 1.5 CAL) 1,000 mL (08/25/19 0905)  . vancomycin 1,250 mg (08/25/19 1443)   PRN  Meds:.sodium chloride, sodium chloride, labetalol, ondansetron (ZOFRAN) IV, Resource ThickenUp Clear, senna-docusate, sodium chloride flush, traMADol    Objective: Weight change: -0.1 kg  Intake/Output Summary (Last 24 hours) at 08/25/2019 1529 Last data filed at 08/25/2019 1000 Gross per 24 hour  Intake 2728.89 ml  Output 3050 ml  Net -321.11 ml   Blood pressure 122/84, pulse 94, temperature 97.7 F (36.5 C), temperature source Oral, resp. rate 15, height 5\' 11"  (1.803 m), weight 92.3 kg, SpO2 99 %. Temp:  [97.7 F (36.5 C)-99.7 F (37.6 C)] 97.7 F (36.5 C) (10/01 1200) Pulse Rate:  [80-108] 94 (10/01 1000) Resp:  [13-23] 15 (10/01 1000) BP: (121-145)/(83-97) 122/84 (10/01 1000) SpO2:  [97 %-100 %] 99 % (10/01 1000) Weight:  [92.3 kg] 92.3 kg (10/01 0500)  Physical Exam: General: Alert and awake, sitting in a chair he tracked me and seem to understand my conversation and then gave me a fist bump with his right hand HEENT: anicteric sclera, EOMI incision surical scar, edema CVS tachycardic no murmurs gallops or rubs Chest: Fairly clear to auscultation bilaterally  abdomen: soft non-distended,  Skin: no rashes Neuro: Left-sided neglect and weakness, strength intact in the right side e CBC:    BMET Recent Labs    08/23/19 0800 08/25/19 0524  NA 137 135  K 3.8 3.3*  CL 105 103  CO2 22 22  GLUCOSE 125* 122*  BUN 16 14  CREATININE 0.42* 0.63  CALCIUM 8.7* 8.6*     Liver Panel  No results for input(s): PROT, ALBUMIN, AST, ALT, ALKPHOS, BILITOT, BILIDIR, IBILI in the last 72 hours.     Sedimentation Rate No results for input(s): ESRSEDRATE in the last 72 hours. C-Reactive Protein No results for input(s): CRP in the last 72 hours.  Micro Results: Recent Results (from the past 720 hour(s))  Culture, blood (routine x 2)     Status: None   Collection Time: 07/30/19  1:09 PM   Specimen: BLOOD LEFT ARM  Result Value Ref Range Status   Specimen Description  BLOOD LEFT ARM  Final   Special Requests   Final    BOTTLES DRAWN AEROBIC AND ANAEROBIC Blood Culture adequate volume   Culture   Final    NO GROWTH 5 DAYS Performed at [facility], [address], [place], [place] [address]    Report Status 08/08/2019 FINAL  Final  Culture, blood (routine x 2)  Status: None   Collection Time: 07/30/19  1:09 PM   Specimen: BLOOD RIGHT HAND  Result Value Ref Range Status   Specimen Description BLOOD RIGHT HAND  Final   Special Requests   Final    BOTTLES DRAWN AEROBIC ONLY Blood Culture results may not be optimal due to an inadequate volume of blood received in culture bottles   Culture   Final    NO GROWTH 5 DAYS Performed at [facility], [place] [address] [place], [place] [address]    Report Status 08/08/2019 FINAL  Final  Culture, blood (Routine X 2) w Reflex to ID Panel     Status: None   Collection Time: 08/01/19  4:50 AM   Specimen: BLOOD  Result Value Ref Range Status   Specimen Description BLOOD PICC LINE  Final   Special Requests   Final    BOTTLES DRAWN AEROBIC ONLY Blood Culture adequate volume   Culture   Final    NO GROWTH 5 DAYS Performed at [facility], [place] [address] [place], [place] [address]    Report Status 08/06/2019 FINAL  Final  Culture, blood (Routine X 2) w Reflex to ID Panel     Status: None   Collection Time: 08/01/19  4:50 AM   Specimen: BLOOD RIGHT HAND  Result Value Ref Range Status   Specimen Description BLOOD RIGHT HAND  Final   Special Requests   Final    BOTTLES DRAWN AEROBIC ONLY Blood Culture adequate volume   Culture   Final    NO GROWTH 5 DAYS Performed at [facility], [place] [address] [place], [place] [address]    Report Status 08/06/2019 FINAL  Final  Culture, Urine     Status: None   Collection Time: 08/04/19 12:48 PM   Specimen: Urine, Random  Result Value Ref Range Status   Specimen Description URINE, RANDOM  Final   Special Requests NONE  Final   Culture   Final     NO GROWTH Performed at [facility], [place] [address] [place], [place] [address]    Report Status 08/05/2019 FINAL  Final  Culture, blood (routine x 2)     Status: None   Collection Time: 08/04/19  6:59 PM   Specimen: BLOOD LEFT ARM  Result Value Ref Range Status   Specimen Description BLOOD LEFT ARM  Final   Special Requests   Final    BOTTLES DRAWN AEROBIC ONLY Blood Culture results may not be optimal due to an inadequate volume of blood received in culture bottles   Culture   Final    NO GROWTH 5 DAYS Performed at [facility], [place] [address], [place], [place] [address]    Report Status 08/09/2019 FINAL  Final  Culture, blood (routine x 2)     Status: None   Collection Time: 08/04/19  6:59 PM   Specimen: BLOOD LEFT HAND  Result Value Ref Range Status   Specimen Description BLOOD LEFT HAND  Final   Special Requests   Final    BOTTLES DRAWN AEROBIC ONLY Blood Culture results may not be optimal due to an inadequate volume of blood received in culture bottles   Culture   Final    NO GROWTH 5 DAYS Performed at [facility], [place] [address] [place],  [address]    Report Status 08/09/2019 FINAL  Final  Body fluid culture     Status: None   Collection Time:  08/05/19 11:33 AM   Specimen: Body Fluid  Result Value Ref Range Status   Specimen Description FLUID ABDOMEN  Final   Special Requests NONE  Final   Gram Stain   Final    RARE WBC PRESENT,BOTH PMN AND MONONUCLEAR NO ORGANISMS SEEN    Culture   Final    NO GROWTH 3 DAYS Performed at [facility], [address] [place], [place] [address]    Report Status 08/08/2019 FINAL  Final  Culture, blood (routine x 2)     Status: None   Collection Time: 08/06/19  4:30 PM   Specimen: BLOOD  Result Value Ref Range Status   Specimen Description BLOOD LEFT ANTECUBITAL  Final   Special Requests AEROBIC BOTTLE ONLY Blood Culture adequate volume  Final   Culture   Final    NO GROWTH 5 DAYS Performed at  [facility] [address], [place], [place] [address]    Report Status 08/11/2019 FINAL  Final  Culture, blood (routine x 2)     Status: None   Collection Time: 08/06/19  4:42 PM   Specimen: BLOOD LEFT HAND  Result Value Ref Range Status   Specimen Description BLOOD LEFT HAND  Final   Special Requests NONE  Final   Culture   Final    NO GROWTH 5 DAYS Performed at [facility], [place] [address] [place], [place] [address]    Report Status 08/11/2019 FINAL  Final  Culture, Urine     Status: Abnormal   Collection Time: 08/13/19  4:27 PM   Specimen: Urine, Clean Catch  Result Value Ref Range Status   Specimen Description URINE, CLEAN CATCH  Final   Special Requests   Final    NONE Performed at [facility], [place] [address] [place], [place] [address]    Culture >=100,000 COLONIES/mL PSEUDOMONAS AERUGINOSA (A)  Final   Report Status 08/15/2019 FINAL  Final   Organism ID, Bacteria PSEUDOMONAS AERUGINOSA (A)  Final      Susceptibility   Pseudomonas aeruginosa - MIC*    CEFTAZIDIME 16 INTERMEDIATE Intermediate     CIPROFLOXACIN 1 SENSITIVE Sensitive     GENTAMICIN <=1 SENSITIVE Sensitive     IMIPENEM 2 SENSITIVE Sensitive     PIP/TAZO 32 SENSITIVE Sensitive     CEFEPIME 16 INTERMEDIATE Intermediate     * >=100,000 COLONIES/mL PSEUDOMONAS AERUGINOSA  Culture, blood (Routine X 2) w Reflex to ID Panel     Status: None   Collection Time: 08/17/19 10:19 AM   Specimen: BLOOD  Result Value Ref Range Status   Specimen Description BLOOD LEFT ANTECUBITAL  Final   Special Requests   Final    BOTTLES DRAWN AEROBIC ONLY Blood Culture adequate volume   Culture   Final    NO GROWTH 5 DAYS Performed at [facility], [address], [place],  [address]    Report Status 08/22/2019 FINAL  Final  Culture, blood (Routine X 2) w Reflex to ID Panel     Status: None   Collection Time: 08/17/19 10:27 AM   Specimen: BLOOD LEFT HAND  Result Value Ref Range Status    Specimen Description BLOOD LEFT HAND  Final   Special Requests   Final    BOTTLES DRAWN AEROBIC ONLY Blood Culture results may not be optimal due to an inadequate volume of blood received in culture bottles   Culture   Final    NO GROWTH 5  DAYS Performed at [facility], [place] [address], [place], [place] [address]    Report Status 08/22/2019 FINAL  Final  Surgical PCR screen     Status: Abnormal   Collection Time: 08/21/19  1:50 AM   Specimen: Nasal Mucosa; Nasal Swab  Result Value Ref Range Status   MRSA, PCR NEGATIVE NEGATIVE Final   Staphylococcus aureus POSITIVE (A) NEGATIVE Final    Comment: CRITICAL RESULT CALLED TO, READ BACK BY AND VERIFIED WITH: RN, TRosanne Sack [id number] @0423  THANEY Performed at [facility] [address] [place], [place] [address]   Body fluid culture     Status: None   Collection Time: 08/21/19  8:35 AM   Specimen: Abscess; Body Fluid  Result Value Ref Range Status   Specimen Description ABSCESS EPIDURAL  Final   Special Requests NONE  Final   Gram Stain   Final    MODERATE WBC PRESENT, PREDOMINANTLY PMN NO ORGANISMS SEEN    Culture   Final    NO GROWTH 3 DAYS Performed at [facility], [address] [place], [place] [address]    Report Status 08/24/2019 FINAL  Final    Studies/Results: No results found.    Assessment/Plan:  INTERVAL HISTORY: Dr Kathyrn Sheriff has asirated abscess at the bedside  Active Problems:   Stroke (cerebrum) ([place])   Pressure injury of skin   Acute respiratory failure with hypoxemia ([facility])   Endotracheal tube present   Deep vein thrombosis (DVT) of non-extremity vein   Hypokalemia   FUO (fever of unknown origin)   Acute blood loss anemia   SIRS (systemic inflammatory response syndrome) (H[facility])   Leukocytosis   Primary hypercoagulable state ([place] [place])   Acute pulmonary embolism without acute cor pulmonale ([place])   Altered mental status    Matthew Black is a 43 y.o. male with history of stroke,  pulmonary embolism with hemorrhagic conversion of his CVA, neurosurgery now complicated by scalp abscess and necrotic brain tissue, not a good candidate to be taken back to the operating room for neurosurgery given risk of herniation.  He is currently on vancomycin and cefepime and neurosurgery of aspirated the abscess and sent for cultures not yield an organism.  He has become afebrile and his neurological status has improved dramatically.  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   LOS: 37 days   Alcide Evener 08/25/2019, 3:29 PM

## 2019-08-25 NOTE — Progress Notes (Signed)
  Speech Language Pathology Treatment: Dysphagia;Cognitive-Linquistic  Patient Details Name: Matthew Black MRN: [id number] DOB: [birth year] Today's Date: 08/25/2019 Time: [id number] SLP Time Calculation (min) (ACUTE ONLY): 21 min  Assessment / Plan / Recommendation Clinical Impression  Pt presented alert in chair with R gaze and lean, significant swelling on R side of head. He has made progress in regards to attention, initiation, and nonverbal communication. He demonstrated sustained attention to SLP and PO trials and consistently used thumbs up to respond yes. Unprompted, he independently raised and clenched R fist to give a fist bump on goodbye. Pt followed 1 step commands during oral cavity assessment which was WNL. Given ice chips pt accepted and masticated with timely swallow, no s/sx of aspiration. Thins via spoon had anterior spillage on R when fed with total assist. Puree WNL, bolus cleared with no residue. MBS scheduled for later today to work towards least restrictive diet given pt improvements. Pts wife was updated via phone on plan for instrumental testing.    HPI HPI: Pt is a 43 y.o. with no known PMH who presents on 8/25 after being found down at work, nonverbal with L hemiparesis. 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 Stable trace midline shift. S/p hemicraniectomy with abdominal flap implant. Intubated 8/26-8/31. CXR worsened atelectasis in the right lower lobe. MBS completed 08/09/2019 with recommendation for Dys1/thin liquids. Pt demonstrated increased lethargy and vomiting. Hemorrhagic conversion on CT, transferred back to ICU. Head CT 08/14/19 with increase in hemorrhage, edema, mass effect and compression of ventricle      SLP Plan  New goals to be determined pending instrumental study       Recommendations  Diet recommendations: NPO Medication Administration: Via alternative means                Oral Care Recommendations:  Oral care QID;Staff/trained caregiver to provide oral care Follow up Recommendations: Skilled Nursing facility SLP Visit Diagnosis: Dysphagia, unspecified (R13.10) Plan: New goals to be determined pending instrumental study       GO                Matthew Black 08/25/2019, 12:57 PM

## 2019-08-25 NOTE — Progress Notes (Signed)
Modified Barium Swallow Progress Note  Patient Details  Name: Matthew Black MRN: [id number] Date of Birth: 1976/09/05  Today's Date: 08/25/2019  Modified Barium Swallow completed.  Full report located under Chart Review in the Imaging Section.  Brief recommendations include the following:  Clinical Impression  Pt presented alert with R lean with neck flexion. He exhibited oropharyngeal dysphagia characterized by reduced labial seal resulting in anterior spillage, penetration and silent aspiration with thins, and generalized weakness when masticating solids. Trials of nectar thick via spoon and straw were WNL including consecutive sips. Puree WNL, timely swallow with no oral or pharyngeal residue. Thin trials via straw resulted in 2 instances of penetration to vocal folds and one instance of silent aspiration. Pt unable to elicit cough on command. Did not attempt compensatory strategies given limited neck ROM. Mechanical soft solids revealed oral weakness with prolonged mastication (1-2 mins for small bite) and moderate lingual residue following initial delayed swallow. Pt stimuable for second swallow with dry spoon and cleared remaining residue. Esophageal backflow into cervical esophagus following final trial of nectar via straw. Esophageal scan revealed possible reduced peristalsis. Recommend Dys 1 with Nectar thick liquids, meds crushed in puree. Full staff supervision with assist for self-feeding necessary to ensure pt is taking appropriately sized sips/bites with clearance of pocketing. SLP will continue to follow for diet toleration.   Swallow Evaluation Recommendations       SLP Diet Recommendations: Dysphagia 1 (Puree) solids;Nectar thick liquid   Liquid Administration via: Straw;Cup   Medication Administration: Crushed with puree   Supervision: Full assist for feeding;Full supervision/cueing for compensatory strategies   Compensations: Minimize environmental distractions;Slow  rate;Small sips/bites;Lingual sweep for clearance of pocketing   Postural Changes: Seated upright at 90 degrees   Oral Care Recommendations: Oral care BID;Staff/trained caregiver to provide oral care   Other Recommendations: Order thickener from pharmacy    Houston Siren 08/25/2019,4:24 PM   Orbie Pyo Colvin Caroli.Ed Risk analyst [phone] Office [phone]

## 2019-08-25 NOTE — Progress Notes (Signed)
STROKE TEAM PROGRESS NOTE   INTERVAL HISTORY Patient is now afebrile and neurologically is awake alert interactive and follows commands consistently.  Vital signs are stable.     He remains on vancomycin and cefepime.   Speech therapy plan to do swallow eval today.  Vitals:   08/25/19 0600 08/25/19 0800 08/25/19 1000 08/25/19 1200  BP: 127/83 (!) 121/93 122/84   Pulse: 80 89 94   Resp: 18 (!) 23 15   Temp:  98.3 F (36.8 C)  97.7 F (36.5 C)  TempSrc:  Oral  Oral  SpO2: 98% 100% 99%   Weight:      Height:        CBC:  Recent Labs  Lab 08/23/19 0800 08/25/19 0524  WBC 8.5 8.3  HGB 10.5* 10.6*  HCT 33.0* 32.8*  MCV 86.6 85.6  PLT 234 599    Basic Metabolic Panel:  Recent Labs  Lab 08/23/19 0800 08/25/19 0524  NA 137 135  K 3.8 3.3*  CL 105 103  CO2 22 22  GLUCOSE 125* 122*  BUN 16 14  CREATININE 0.42* 0.63  CALCIUM 8.7* 8.6*    IMAGING past 24h No results found.   PHYSICAL EXAM      General- Well nourished, well developed, middle-aged African-American male. He has right hemicraniectomy surgical incision on the scalp, bulging.  Right head bulging from crani site.  Bandage on the surgical site.  Ophthalmologic- fundi not visualized due to noncooperation.  Cardiovascular - Regular rhythm and rate.  Neuro - patient with open eyes. Nonverbal,  following commands on the right side and moves right upper and lower extremity purposefully to command.  Eyes in right gaze position, does not cross midline, not blinking to visual threat to the left, PERRL. Left facial droop. Tongue protrusion not corporative. Has spontaneous movement of RUE against gravity and RLE in bed 3/5 and moving spontaneously. On pain stimulation, mild withdraw of LLE, but no movement of LUE. DTR 1+ and toes equiv. Sensation, coordination and gait not tested.   ASSESSMENT/PLAN [name] is a 43 y.o. male with no significant past medical history found down x 2 days nonverbal with L  hemiparesis.   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 Trace L midline shift. ELVO at R ICA, R A1, R MCA.  MRI  Large R brain infarct sparing R PCA. Cytotoxic edema but no hemorrhage. Stable trace midline shift.  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 R ICA terminus, R ACA, R MCA occluded.   CT head similar w/ low density and sweddling R ACA and MCA territories, now with 5-59m midline shift. no hemorrhage  CT head acute R ACA and MCA infarct w/ progressive swelling decompressed through craniectomy defect.   CTA head and neck stable MLS. R ICA occlusion in neck that continues into R ACA and MCA. Mild petechial hemorrhage at basal ganglia  CT Head 8/29 - Unchanged appearance of massive right MCA and ACA territory infarcts with parenchyma extending through decompressive craniectomy. Small focus of suspected hemorrhage adjacent to the right caudate head.  CT head 07/30/19 - New hematoma within the infarcted tissue   CT head  08/01/2019 stable w/o change  CT head 08/11/19 - new patchy hemorrhage along periphery of infarcted R frontoparietal lobe (likely HT) decreased dominant anterior margin hemorrhage. Stable herniation of infarcted brain through crani site.   CT head 9/20 - worsening with increased HT on the right and new IVH  CT head 08/15/19 - slightly worsened HT from previous.   CT head 9/22 hemorrhagic R ACA and MCA territory infarcts with edematous brain bulging through Crani defect.  No change x2 days  CT head 9/24 stable hematoma, increased right hemisphere mass-effect, external herniation from Crani site, making right lateral ventriculomegaly than prior images.  2D Echo EF  60-65%  2D echo w/ bubble ordered no LRS   LE venous doppler DVT in R peroneal, L popliteal, L posterior tibial and L peroneal  LE venous doppler repeat DVT in R peroneal, L popliteal, L posterior tibial and L peroneal  TCD bubble no HITS, no PFO  LDL 83  HgbA1c 5.4  UDS positive THC  Hypercoagulable and autoimmune work up negative  Add Heparin 5000 units sq tid for VTE prophylaxis  No antithrombotic prior to admission, treated DVT with heparin IV, then  discontinued due to hemorrhagic conversion with right frontal HT, then resumed heparin IV given extensive DVT and PE, heparin IV again discontinued due to worsening HT  Therapy recommendations:  SNF (reassessed by CIR and declined 9/16)  Disposition:  Pending  Palliative Care consulted met with wife 9/29 - wife wants full aggressive care   Transfer to progressive unit (order placed9/29). 4NP being considered by 4N and MD  Transfer back to [facility] as attending  Right ICA occlusion  MRA and CTA showed right ICA occlusion from origin to terminal  Wife denies any head trauma  Wife stated that pt had recent aggressive exercise with weight lifting  Concerning dissection as working diagnosis of stroke etiology   Cyctotoxic cerebral edema  S/p R decompressive hemicraniectomy (Nundkumar) w/ flap R abd 07/19/2019  PICC placed - keep for now per Sethi  Given One dose of 23.4% 8/26; 3%  off 8/30 1700  CT repeat 07/30/19 - New parenchymal hemorrhage within the infarcted tissue  Off helmet to further release pressure  CT repeat 08/01/2019 stable w/o change  CT head repeat 08/11/19 - new patchy hemorrhage along periphery of infarcted R frontoparietal lobe (likely HT) but previous right frontal hematoma now much reduced in size  CT head 9/20 worsening right HT with new ICH and IVH  CT Head 9/21 - slightly worsened HT from previous.   CT head 9/22 hemorrhagic R ACA and MCA territory infarcts with edematous brain bulging through  Crani defect.  No change x2 days  CT head 9/24 stable hematoma, increased right hemisphere mass-effect, external herniation from Crani site, making right lateral ventriculomegaly than prior images. - discussed with Dr. 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 Correlation with physical exam and fluid sampling suggested for further evaluation.  MRI W&WO - 08/20/19 - ...herniation of brain into the craniectomy site. Large irregularly enhancing fluid collection along the anterior margin of the craniectomy measuring approximately 5.4 x 9.8 cm. This may represent a large abscess....sterile hematoma versus infected hematoma.   Dr Nundkumar aspirated 9/27 and sent for cx. Not a surgical candidate d/t ris increasing herniation. .    Cx moderate WBC, no organisms - no growth < 24h  Tmax - 101.6  WBC - 10.0  ID consulted   Maxipime, Vancomycin 9/26>> (plan tx x 6-8 weeks, duration to depend on reimaging)  SIRS/fever/leukocytosis due to necrotic brain tissue   Tmax - 100.5  WBC - 10.0  CXR x 3 NAD  Blood cultures - neg, repeated again 08/04/19 -> no growth on empiric unasyn -> changed to vanco, cefepime and flagyl 08/05/19->now off  Lactic acid 1.2->2.0  Hepatitis panel 9/12 - neg  procalcitonin and lactic acid normal   As no longer a central neurogenic fever, weaning:  Decreased baclofen 10 mg bid, bromocriptine 5 mg q8h, and d/c buspar - continue for 2-3 days then d/c baclofen completely  B LE DVT Small LLL PE  LE venous doppler DVT in R peroneal, L popliteal, L posterior tibial and L peroneal  Etiology unclear  Started IV heparin 07/26/2019  IVC filter placed 8/27. Plan retrieval in 8-12 weeks as an IP in an IR clinic  Hypercoagulable work up  negative   LE venous doppler 08/02/19 repeat extensive DVT in R peroneal, L popliteal, L posterior tibial and L peroneal up to the IVC filter  CT chest Nonocclusive thrombus seen within the left posterior lower lobe segmental artery.   Treated with IV Heparin but then off secondary to intracranial hemorrhage, then restarted IV heparin given extensive DVT and PE but again stopped due to worsening HT on 9/20  Hematology consult, appreciate help - hypercoag w/u neg.  At risk for recurrent clots   Factor V Leiden, homocysteine, Prot C&S, Prothombin gene mutation all neg.   Abdominal wall hematoma  Large fluid collection seen on CT abdomen  CT abd/pel concerning for abdominal wall fluid collection likely old hematoma  Dr. Kathyrn Sheriff did fluid aspiration and seems to be old blood component  Fluid aspiration sent for culture 9/11 - no growth < 24 hrs  Repeat ABD [place] complex seroma vs liquified hematoma R abd similar to previous  Seizure-like activity  EEG continuous slowing, excessive beta activity, sleep spindle asymmetry decreased R->related to stroke and sedation. No SZ  Long Term EEG cortical dysfunction in right frontal region  Seizure precautions  Continue Keppra   UTI, resolved  Urine culture pseudomonas - treated with  to zosyn  UA 9/21 - WBC 0-5 negative   UA 9/23 - WBC 11-20  Repeat B Cx no growth  Tachycardia, resolved  HR normal now  Likely due to fever  Hydration with IVF, TF and free water  On metoprolol to 50 Q8h  EKG ST, rate 120 w/ possible LA enlargement  Troponin series neg x 3    Hyperlipidemia  Home meds:  no statin  LDL 83, goal < 70  Was on lipitor 20 mg daily   D/c statin due to elevated LFT  Consider statin at d/c if LFTs down  Elevated LFT, almost resolved  AST - 105-118-98-85-38-36  ALT - 80-68-65-164-64-53  CK [phone] [phone]  Hepatitis panel 9/12 - neg  Dysphagia . Secondary to stroke . Dysphagia 1 diet with  thin liquid -> NPO due to neuro worsening   IVF @ 50cc total  Speech to reassess as pt improves   Other Stroke Risk Factors  Former Cigarette smoker, quit 3 yrs ago  ETOH use  Substance abuse UDS - positive for THC   Obesity, Body mass index is 28.38 kg/m., slightly overweight, recommend weight loss, diet and exercise as appropriate   Other Active  Problems  Hypokalemia 3.2 - replaced   Normocytic anemia - hematology felt d/t ongoing blood draws Hgb 10.5  Hospital day # 37 Continue vancomycin and cefepime follow-up brain abscess/cerebritis and pharmacy following dosing.  Mobilize out of bed.  Continue therapy consults.  Speech therapy to do swallow eval today and if he passes will start him on a diet.  Appreciate transfer to medical hospitalist team as primary attending now.  Transfer out of ICU to stepdown unit when bed available.  I spoke to patient's wife yesterday afternoon about plan of care and answered questions.  Discussed with Dr. Thompson This patient is critically ill and at significant risk of neurological worsening, death and care requires constant monitoring of vital signs, hemodynamics,respiratory and cardiac monitoring, extensive review of multiple databases, frequent neurological assessment, discussion with family, other specialists and medical decision making of high complexity.I have made any additions or clarifications directly to the above note.This critical care time does not reflect procedure time, or teaching time or supervisory time of PA/NP/Med Resident etc but could involve care discussion time.  I spent 30 minutes of neurocritical care time  in the care of  this patient.      Pramod Sethi, MD     To contact Stroke Continuity provider, please refer to [email or web address]. After hours, contact General Neurology 

## 2019-08-25 NOTE — Progress Notes (Signed)
PROGRESS NOTE    Matthew Black  [id number] DOB: 1976-11-18 DOA: 07/19/2019 PCP: Colon Branch, MD    Brief Narrative:  Patient 43 year old gentleman with no significant past medical history who was found down x2 days nonverbal with left hemiparesis.  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  Patient status post hemicraniectomy with abdominal flap implant.  Patient subsequently underwent a hemorrhagic conversion with hematoma 07/30/2019 which worsened on 9/20 with right infarct area.  Patient was seen and followed by neurology in the ICU.  Patient subsequently underwent right decompressive hemicraniectomy by Dr. Kathyrn Sheriff with flap on 07/19/2019. 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  Patient underwent aspiration on 08/21/2019 per Dr. Kathyrn Sheriff and cultures sent.  Patient noted to be not a surgical candidate due to increasing herniation.  ID was consulted and patient started empirically on IV vancomycin IV cefepime. Patient also noted to have a bilateral lower extremity DVT and small left lower lobe PE per venous Dopplers and CT angiogram.  Heparin was started on 07/26/2019.  IVC filter placed 07/21/2019 with plan retrieval 8 to 12 weeks if patient still hospitalized or in the outpatient IR clinic.  Hypercoagulable work-up done was negative.  Oncology consulted.  Patient was on IV heparin however subsequently discontinued due to intracranial hemorrhage and then subsequently resumed given extensive DVT and PE but then subsequently discontinued due to worsening hemorrhage on 08/14/2019.  Patient also noted to have abdominal wall hematoma.  Patient  also noted to have some seizure-like activity subsequently placed on seizure precautions and on Keppra.  Patient status post treatment of Pseudomonas UTI with Zosyn.  Noted with dysphagia with speech therapy following.  Patient subsequently transferred to tried service on 08/25/2019 with neurology following.   Assessment & Plan:   Active Problems:   Stroke (cerebrum) (HCC)   Pressure injury of skin   Acute respiratory failure with hypoxemia (HCC)   Endotracheal tube present   Deep vein thrombosis (DVT) of non-extremity vein   Hypokalemia   FUO (fever of unknown origin)   Acute blood loss anemia   SIRS (systemic inflammatory response syndrome) (HCC)   Leukocytosis   Primary hypercoagulable state (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  Questionable etiology per neurology concerned that patient may have had a possible right ICA dissection from exercise.  Patient noted to have hemorrhagic conversion with hematoma 07/30/2019 with worsening on 08/14/2019 in the right infarct. Patient followed by neurology throughout the hospitalization.  Patient had head CT done that showed a large right brain infarct with edema right ACA and MCA territories.  MRI showed large right brain infarct with cytotoxic edema but no hemorrhage.  MRA head and neck showed right ICA occluded at origin.  Right ICA terminus, R ACA, R MCA occluded.  Head CT which was done showed 5 to 6 mm midline shift with no hemorrhage.  Patient underwent acute head CT that showed progression with swelling decompressive craniectomy defect.  CTA head and neck  was done that showed right ICA occlusion neck that continues into right ACA and MCA. CT head done 07/23/2019 showed unchanged appearance of massive right MCA and ACA territory infarct with parenchyma  extending through decompressive craniectomy.  Small focus of suspected hemorrhage adjacent to right caudate head. CT head done 07/30/2019 with new hematoma within infarcted tissue. CT head done 08/01/2019 with no significant change. CT head done 08/11/2019 with new patchy hemorrhage along periphery of infarcted right frontal parietal lobe, decreased dominant anterior margin hemorrhage.  Stable herniation of infarcted brain through craniectomy site. CT head done 08/14/2019 with worsening with increased hemorrhagic transformation on the right and new IVH. CT head 08/15/2019 slightly worsened hemorrhagic transformation from previous. CT head done 08/16/2019 with hemorrhagic right ACA and MCA territory infarct with edematous brain bulging through Crani defect. CT head 08/18/2019 with stable hematoma, increased right hemisphere mass-effect, external herniation from Crani site, with market right lateral ventriculomegaly than prior images. 2D echo done with a EF of 60 to 65% of emboli. 2D echo with bubble study ordered was negative for PFO. Lower extremity Dopplers done showed bilateral DVT. Fasting lipid panel done with LDL of 83.  Patient underwent hypercoagulable autoimmune work-up which was negative.  Patient placed on heparin 5000 units 3 times daily for DVT prophylaxis which was subsequently discontinued as patient underwent hemorrhagic transformation.  Patient was placed back on IV heparin due to extensive DVT/PE which was subsequently discontinued due to worsening hemorrhagic transformation.  Patient seen by CIR and declined on 08/10/2019.  Palliative care following.  Neurology following.  PT/OT/ST.  2.  Right ICA occlusion Per MRA.  Per neurology patient's wife denies any recent head trauma and stated patient had recent aggressive exercise with weight lifting.  Per neurology dissection likely etiology of patient's stroke.  3.  Cytotoxic cerebral edema Status post right decompressive hemicraniectomy per Dr.  Kathyrn Sheriff with right flap 07/19/2019.  Patient was placed on hypertonic saline per neurology which was discontinued 07/24/2019.  Patient had repeat head CT done 07/30/2019 with new parenchymal hemorrhage with ineffective tissue.  Helmet off for further pressure release.  Repeat CT done 08/01/2019 with no significant changes.  Repeat head CT done 08/11/2019 with new patchy hemorrhage along periphery of infarcted right frontoparietal lobe likely hemorrhagic transformation.  Repeat head CT done 920 with worsening right hemorrhagic transformation and new ICH and IVH.  CT head done 08/15/2019 with slightly worsened hemorrhagic transformation from before.  Repeat head CT done 08/16/2019 with hemorrhagic right ACA and MCA territory infarcts with edematous brain bulging through craniectomy defect with no cystic change x2 days.  Repeat head CT done 924 with stable hematoma, increased right hemisphere mass-effect, external herniation from [place] site making right lateral ventriculomegaly than prior images.  Neurology discussed with neurosurgery and was felt no further intervention needed at this time.  4.  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 Lucia Gaskins, 08/21/2019 and sent for cultures.  Patient noted not to be a surgical candidate due to increasing herniation.  ID consulted and patient started on cefepime and vancomycin on 08/20/2019 with duration of therapy 6 to 8 weeks per ID and patient likely needs repeat imaging of the head prior to discontinuation of antibiotics.  ID following.  5.  Bilateral lower extremity DVT/small left lower lobe PE Noted on lower extremity Dopplers.  Unclear etiology.  Patient initially started on IV heparin on 07/26/2019.  IVC filter was placed 07/21/2019 with plan retrieval 8 to 12 weeks per IR.  Hypercoagulable work-up which was done was negative.  Oncology consulted.  Patient underwent repeat lower extremity Dopplers 08/02/2019 that showed extensive DVT in the  right peroneal, left popliteal, left posterior tibial and left peroneal up to the IVC filter.  Chest CT which was done showed nonocclusive thrombus within the left posterior lower lobe segmental artery.  Patient treated with IV heparin which was subsequently discontinued secondary to hemorrhagic transformation/intracranial hemorrhage.  Will likely need outpatient follow-up with hematology.  6.  Abdominal wall hematoma Noted on CT abdomen and pelvis.  Dr. Kathyrn Sheriff did fluid aspiration and seem to be old blood component.  Cultures with no growth to date.  Repeat abdominal ultrasound shows a complex aroma versus liquefied hematoma.  7.  Seizure-like activity Patient underwent EEG that showed continuous slowing, excessive beta activity likely secondary to acute CVA.  Patient underwent long-term EEG that showed cortical dysfunction in the right frontal region.  Patient placed on seizure precautions.  Continue Keppra.  8.  Pseudomonas UTI  Status post full course of IV antibiotics with Zosyn.  9.  Hyperlipidemia LDL noted to be at 83.  Goal LDL less than 70.  Statin discontinued secondary to transaminitis.  Consider resumption of statin on discharge if LFTs have trended down.  10.  Dysphagia Secondary to acute CVA.  Speech therapy to reassess.  11.  Hypokalemia KCl 10 mEq IV every 1 hour x4 runs.    DVT prophylaxis: SCDs Code Status: Full Family Communication: No family at bedside. Disposition Plan: SNF   Consultants:   Palliative care: Dr. Rowe Pavy 08/23/2019  ID: Dr. Tommy Medal 08/20/2019  ID: Dr. Prince Rome 08/05/2019  Oncology: Dr. Irene Limbo 08/03/2019  [facility]: Dr. Lorin Mercy 08/02/2019  Rehab medicine Dr. Posey Pronto 07/27/2019  Neurosurgery: Dr. Kathyrn Sheriff 07/19/2019  PCCM: Dr Nelda Marseille 07/19/2019  Procedures:   - CT head 08/20/2019, 08/18/2019, 08/16/2019, 08/14/2019, 08/11/2019, 08/01/2019, 07/30/2019, 07/23/2019 CT angios head and neck 07/21/2019 CT angiogram chest 08/01/2019 CT abdomen and pelvis  08/04/2019 Abdominal ultrasound 08/11/2019 2D echo 07/20/2019,  2D echo bubble study 08/03/2019 Lower extremity Dopplers 07/21/2019, 08/02/2019  MRI head 08/20/2019, 07/19/2019 Modified barium swallow 08/25/2019 MRA head and neck 07/19/2019  Antimicrobials:   IV vancomycin 08/20/2019  IV cefepime 08/20/2019  IV Zosyn 08/15/2019>>>> 08/20/2019  IV Flagyl 08/22/2019 x 1   Subjective: Patient sitting up in bed with right-sided deviation.  Patient follows some commands appropriately.  Patient seems somewhat aphasic.  Objective: Vitals:   08/25/19 0500 08/25/19 0517 08/25/19 0600 08/25/19 0800  BP: 130/87 130/87 127/83   Pulse: 91 94 80   Resp: 15  18   Temp:    98.3 F (36.8 C)  TempSrc:    Oral  SpO2: 100%  98%   Weight: 92.3 kg     Height:        Intake/Output Summary (Last 24 hours) at 08/25/2019 1033 Last data filed at 08/25/2019 1000 Gross per 24 hour  Intake 2806.41 ml  Output 3500 ml  Net -693.59 ml   Filed Weights   08/23/19 0500 08/24/19 0500 08/25/19 0500  Weight: 90.2 kg 92.4 kg 92.3 kg    Examination:  General exam: NAD.  Right hemicraniectomy surgical incision on scalp with bulging on the right. Respiratory system: Lungs clear to auscultation bilaterally anterior lung fields.  No wheezes, no crackles, no rhonchi.  Cardiovascular system: Regular rate rhythm no murmurs rubs or gallops.  No JVD.  No lower extremity edema.  Gastrointestinal system: Abdomen is soft, nontender, nondistended, positive bowel sounds.  No  rebound.  No guarding.   Central nervous system: Alert and following some commands.  Nonverbal.  Moving right upper extremity and right lower extremity to commands.  Right gaze position.  Left sided weakness.  Extremities: Symmetric 5 x 5 power. Skin: No rashes, lesions or ulcers Psychiatry: Judgement and insight appear normal. Mood & affect appropriate.     Data Reviewed: I have personally reviewed following labs and imaging studies  CBC: Recent Labs    Lab 09-Sep-2019 0517 08/20/19 0411 08/23/19 0800 08/25/19 0524  WBC 12.5* 10.0 8.5 8.3  HGB 10.7* 10.6* 10.5* 10.6*  HCT 33.7* 32.1* 33.0* 32.8*  MCV 87.5 86.3 86.6 85.6  PLT 226 218 234 0000000   Basic Metabolic Panel: Recent Labs  Lab 09-09-19 0517 08/20/19 0411 08/22/19 0442 08/23/19 0800 08/25/19 0524  NA 140 143 141 137 135  K 3.8 3.5 3.2* 3.8 3.3*  CL 105 112* 111 105 103  CO2 25 21* 21* 22 22  GLUCOSE 124* 111* 126* 125* 122*  BUN 19 18 16 16 14   CREATININE 1.07 0.93 0.71 0.42* 0.63  CALCIUM 8.8* 8.4* 7.8* 8.7* 8.6*   GFR: Estimated Creatinine Clearance: 141.1 mL/min (by C-G formula based on SCr of 0.63 mg/dL). Liver Function Tests: Recent Labs  Lab 08/22/19 0442  AST 36  ALT 53*  ALKPHOS 86  BILITOT 0.6  PROT 6.1*  ALBUMIN 2.5*   No results for input(s): LIPASE, AMYLASE in the last 168 hours. No results for input(s): AMMONIA in the last 168 hours. Coagulation Profile: No results for input(s): INR, PROTIME in the last 168 hours. Cardiac Enzymes: No results for input(s): CKTOTAL, CKMB, CKMBINDEX, TROPONINI in the last 168 hours. BNP (last 3 results) No results for input(s): PROBNP in the last 8760 hours. HbA1C: No results for input(s): HGBA1C in the last 72 hours. CBG: Recent Labs  Lab 08/24/19 1540 08/24/19 2003 08/24/19 2335 08/25/19 0338 08/25/19 0817  GLUCAP 105* 119* 117* 111* 113*   Lipid Profile: No results for input(s): CHOL, HDL, LDLCALC, TRIG, CHOLHDL, LDLDIRECT in the last 72 hours. Thyroid Function Tests: No results for input(s): TSH, T4TOTAL, FREET4, T3FREE, THYROIDAB in the last 72 hours. Anemia Panel: No results for input(s): VITAMINB12, FOLATE, FERRITIN, TIBC, IRON, RETICCTPCT in the last 72 hours. Sepsis Labs: No results for input(s): PROCALCITON, LATICACIDVEN in the last 168 hours.  Recent Results (from the past 240 hour(s))  Culture, blood (Routine X 2) w Reflex to ID Panel     Status: None   Collection Time: 08/17/19 10:19  AM   Specimen: BLOOD  Result Value Ref Range Status   Specimen Description BLOOD LEFT ANTECUBITAL  Final   Special Requests   Final    BOTTLES DRAWN AEROBIC ONLY Blood Culture adequate volume   Culture   Final    NO GROWTH 5 DAYS Performed at [facility], [address] [place], [place] [address]    Report Status 08/22/2019 FINAL  Final  Culture, blood (Routine X 2) w Reflex to ID Panel     Status: None   Collection Time: 08/17/19 10:27 AM   Specimen: BLOOD LEFT HAND  Result Value Ref Range Status   Specimen Description BLOOD LEFT HAND  Final   Special Requests   Final    BOTTLES DRAWN AEROBIC ONLY Blood Culture results may not be optimal due to an inadequate volume of blood received in culture bottles   Culture   Final    NO GROWTH 5 DAYS Performed at [facility], [place] [address] [place], [place] [address]    Report Status 08/22/2019 FINAL  Final  Surgical PCR screen     Status: Abnormal   Collection Time: 08/21/19  1:50 AM   Specimen: Nasal Mucosa; Nasal Swab  Result Value Ref Range Status   MRSA, PCR NEGATIVE NEGATIVE Final   Staphylococcus aureus POSITIVE (A) NEGATIVE Final    Comment: CRITICAL RESULT CALLED TO, READ BACK BY AND VERIFIED WITH: RN, TRosanne Sack ET:[id number] @0423  THANEY Performed at [facility] [address], [place], [place] [address]   Body fluid culture     Status: None   Collection Time: 08/21/19  8:35 AM   Specimen: Abscess; Body Fluid  Result Value Ref Range Status   Specimen Description ABSCESS EPIDURAL  Final   Special Requests NONE  Final   Gram Stain   Final    MODERATE WBC PRESENT, PREDOMINANTLY PMN NO ORGANISMS SEEN    Culture   Final    NO GROWTH 3 DAYS Performed at [facility], [address] [place], [place] [address]    Report Status 08/24/2019 FINAL  Final         Radiology Studies: No results found.      Scheduled Meds:  acetaminophen  650 mg Oral Q4H   Or   acetaminophen (TYLENOL) oral  liquid 160 mg/5 mL  650 mg Per Tube Q4H   Or   acetaminophen  650 mg Rectal Q4H   baclofen  10 mg Per Tube BID   bromocriptine  5 mg Per Tube Q8H   chlorhexidine  15 mL Mouth Rinse BID   Chlorhexidine Gluconate Cloth  6 each Topical Daily   famotidine  20 mg Per Tube BID   feeding supplement (PRO-STAT SUGAR FREE 64)  30 mL Per Tube TID   heparin injection (subcutaneous)  5,000 Units Subcutaneous Q8H   levETIRAcetam  500 mg Per Tube BID   mouth rinse  15 mL Mouth Rinse q12n4p   metoprolol tartrate  50 mg Per Tube Q8H   mupirocin ointment  1 application Nasal BID   sodium chloride flush  10-40 mL Intracatheter Q12H   Continuous Infusions:  sodium chloride 40 mL/hr at 08/25/19 0600   sodium chloride Stopped (08/21/19 2124)   sodium chloride Stopped (08/23/19 0945)   ceFEPime (MAXIPIME) IV Stopped (08/25/19 0240)   feeding supplement (OSMOLITE 1.5 CAL) 1,000 mL (08/25/19 0905)   potassium chloride 10 mEq (08/25/19 1009)   vancomycin 1,250 mg (08/25/19 0656)     LOS: 37 days    Time spent: 40 minutes    Irine Seal, MD [facility]  If 7PM-7AM, please contact night-coverage [email or web address] 08/25/2019, 10:33 AM

## 2019-08-26 LAB — BASIC METABOLIC PANEL
Anion gap: 10 (ref 5–15)
BUN: 13 mg/dL (ref 6–20)
CO2: 22 mmol/L (ref 22–32)
Calcium: 8.3 mg/dL — ABNORMAL LOW (ref 8.9–10.3)
Chloride: 103 mmol/L (ref 98–111)
Creatinine, Ser: 0.61 mg/dL (ref 0.61–1.24)
GFR calc Af Amer: >60 mL/min (ref >60)
GFR calc non Af Amer: >60 mL/min (ref >60)
Glucose, Bld: 107 mg/dL — ABNORMAL HIGH (ref 70–99)
Potassium: 3.5 mmol/L (ref 3.5–5.1)
Sodium: 135 mmol/L (ref 135–145)

## 2019-08-26 LAB — CBC WITH DIFFERENTIAL/PLATELET
Abs Immature Granulocytes: 0.05 10*3/uL (ref 0.00–0.07)
Basophils Absolute: 0.1 10*3/uL (ref 0.0–0.1)
Basophils Relative: 1 %
Eosinophils Absolute: 0.1 10*3/uL (ref 0.0–0.5)
Eosinophils Relative: 1 %
HCT: 32.3 % — ABNORMAL LOW (ref 39.0–52.0)
Hemoglobin: 10.4 g/dL — ABNORMAL LOW (ref 13.0–17.0)
Immature Granulocytes: 1 %
Lymphocytes Relative: 21 %
Lymphs Abs: 1.7 10*3/uL (ref 0.7–4.0)
MCH: 27.7 pg (ref 26.0–34.0)
MCHC: 32.2 g/dL (ref 30.0–36.0)
MCV: 85.9 fL (ref 80.0–100.0)
Monocytes Absolute: 0.8 10*3/uL (ref 0.1–1.0)
Monocytes Relative: 10 %
Neutro Abs: 5.4 10*3/uL (ref 1.7–7.7)
Neutrophils Relative %: 66 %
Platelets: 287 10*3/uL (ref 150–400)
RBC: 3.76 MIL/uL — ABNORMAL LOW (ref 4.22–5.81)
RDW: 14.2 % (ref 11.5–15.5)
WBC: 8.1 10*3/uL (ref 4.0–10.5)
nRBC: 0 % (ref 0.0–0.2)

## 2019-08-26 LAB — GLUCOSE, CAPILLARY
Glucose-Capillary: 97 mg/dL (ref 70–99)
Glucose-Capillary: 108 mg/dL — ABNORMAL HIGH (ref 70–99)
Glucose-Capillary: 118 mg/dL — ABNORMAL HIGH (ref 70–99)
Glucose-Capillary: 124 mg/dL — ABNORMAL HIGH (ref 70–99)
Glucose-Capillary: 110 mg/dL — ABNORMAL HIGH (ref 70–99)

## 2019-08-26 MED ORDER — POTASSIUM CHLORIDE 10 MEQ/50ML IV SOLN
10.0000 meq | INTRAVENOUS | Status: AC
  Administered 2019-08-26 (×4): 10 meq via INTRAVENOUS
  Filled 2019-08-26 (×4): qty 50

## 2019-08-26 NOTE — Progress Notes (Signed)
Pt txfered to [facility]. Wife aware. All belongings transferred as well. Report given to [facility].

## 2019-08-26 NOTE — Progress Notes (Signed)
PROGRESS NOTE    Matthew Black  [id number] DOB: 13-Oct-1976 DOA: 07/19/2019 PCP: Colon Branch, MD    Brief Narrative:  Patient 43 year old gentleman with no significant past medical history who was found down x2 days nonverbal with left hemiparesis.  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  Patient status post hemicraniectomy with abdominal flap implant.  Patient subsequently underwent a hemorrhagic conversion with hematoma 07/30/2019 which worsened on 9/20 with right infarct area.  Patient was seen and followed by neurology in the ICU.  Patient subsequently underwent right decompressive hemicraniectomy by Dr. Kathyrn Sheriff with flap on 07/19/2019. 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  Patient underwent aspiration on 08/21/2019 per Dr. Kathyrn Sheriff and cultures sent.  Patient noted to be not a surgical candidate due to increasing herniation.  ID was consulted and patient started empirically on IV vancomycin IV cefepime. Patient also noted to have a bilateral lower extremity DVT and small left lower lobe PE per venous Dopplers and CT angiogram.  Heparin was started on 07/26/2019.  IVC filter placed 07/21/2019 with plan retrieval 8 to 12 weeks if patient still hospitalized or in the outpatient IR clinic.  Hypercoagulable work-up done was negative.  Oncology consulted.  Patient was on IV heparin however subsequently discontinued due to intracranial hemorrhage and then subsequently resumed given extensive DVT and PE but then subsequently discontinued due to worsening hemorrhage on 08/14/2019.  Patient also noted to have abdominal wall hematoma.  Patient  also noted to have some seizure-like activity subsequently placed on seizure precautions and on Keppra.  Patient status post treatment of Pseudomonas UTI with Zosyn.  Noted with dysphagia with speech therapy following.  Patient subsequently transferred to tried service on 08/25/2019 with neurology following.   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  Questionable etiology per neurology concerned that patient may have had a possible right ICA dissection from exercise.  Patient noted to have hemorrhagic conversion with hematoma 07/30/2019 with worsening on 08/14/2019 in the right infarct. Patient followed by neurology throughout the hospitalization.  Patient had head CT done that showed a large right brain infarct with edema right ACA and MCA territories.  MRI showed large right brain infarct with cytotoxic edema but no hemorrhage.  MRA head and neck showed right ICA occluded at origin.  Right ICA terminus, R ACA, R MCA occluded.  Head CT which was done showed 5 to 6 mm midline shift with no hemorrhage.  Patient underwent acute head CT that showed  progression with swelling decompressive craniectomy defect.  CTA head and neck was done that showed right ICA occlusion neck that continues into right ACA and MCA. CT head done 07/23/2019 showed unchanged  appearance of massive right MCA and ACA territory infarct with parenchyma extending through decompressive craniectomy.  Small focus of suspected hemorrhage adjacent to right caudate head. CT head done 07/30/2019 with new hematoma within infarcted tissue. CT head done 08/01/2019 with no significant change. CT head done 08/11/2019 with new patchy hemorrhage along periphery of infarcted right frontal parietal lobe, decreased dominant anterior margin hemorrhage.  Stable herniation of infarcted brain through craniectomy site. CT head done 08/14/2019 with worsening with increased hemorrhagic transformation on the right and new IVH. CT head 08/15/2019 slightly worsened hemorrhagic transformation from previous. CT head done 08/16/2019 with hemorrhagic right ACA and MCA territory infarct with edematous brain bulging through Crani defect. CT head 08/18/2019 with stable hematoma, increased right hemisphere mass-effect, external herniation from Crani site, with market right lateral ventriculomegaly than prior images. 2D echo done with a EF of 60 to 65% of emboli. 2D echo with bubble study ordered was negative for PFO. Lower extremity Dopplers done showed bilateral DVT. Fasting lipid panel done with LDL of 83.  Patient underwent hypercoagulable autoimmune work-up which was negative.  Patient placed on heparin 5000 units 3 times daily for DVT prophylaxis which was subsequently discontinued as patient underwent hemorrhagic transformation.  Patient was placed back on IV heparin due to extensive DVT/PE which was subsequently discontinued due to worsening hemorrhagic transformation.  Patient seen by CIR and declined on 08/10/2019.  Palliative care following.  Neurology following.  PT/OT/ST.  2.  Right ICA occlusion Per MRA.  Per neurology patient's wife denies any recent head trauma and stated patient had recent aggressive exercise with weight lifting.  Per neurology dissection likely etiology of patient's stroke.  3.   Cytotoxic cerebral edema Status post right decompressive hemicraniectomy per Dr. Kathyrn Sheriff with right flap 07/19/2019.  Patient was placed on hypertonic saline per neurology which was discontinued 07/24/2019.  Patient had repeat head CT done 07/30/2019 with new parenchymal hemorrhage with ineffective tissue.  Helmet off for further pressure release.  Repeat CT done 08/01/2019 with no significant changes.  Repeat head CT done 08/11/2019 with new patchy hemorrhage along periphery of infarcted right frontoparietal lobe likely hemorrhagic transformation.  Repeat head CT done 920 with worsening right hemorrhagic transformation and new ICH and IVH.  CT head done 08/15/2019 with slightly worsened hemorrhagic transformation from before.  Repeat head CT done 08/16/2019 with hemorrhagic right ACA and MCA territory infarcts with edematous brain bulging through craniectomy defect with no cystic change x2 days.  Repeat head CT done 924 with stable hematoma, increased right hemisphere mass-effect, external herniation from Crani site making right lateral ventriculomegaly than prior images.  Neurology discussed with neurosurgery and was felt no further intervention needed at this time.  4.  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 Lucia Gaskins, 08/21/2019 and sent for cultures.  Patient noted not to be a surgical candidate due to increasing herniation.  ID consulted and patient started on cefepime and vancomycin on 08/20/2019 with duration of therapy 6 to 8 weeks per ID and patient likely needs repeat imaging of the head prior to discontinuation of antibiotics.  ID following.  5.  Bilateral lower extremity DVT/small left lower lobe PE Noted on lower extremity Dopplers.  Unclear etiology.  Patient initially started on IV heparin on 07/26/2019.  IVC filter was placed  07/21/2019 with plan retrieval 8 to 12 weeks per IR.  Hypercoagulable work-up which was done was negative.  Oncology consulted.  Patient  underwent repeat lower extremity Dopplers 08/02/2019 that showed extensive DVT in the right peroneal, left popliteal, left posterior tibial and left peroneal up to the IVC filter.  Chest CT which was done showed nonocclusive thrombus within the left posterior lower lobe segmental artery.  Patient treated with IV heparin which was subsequently discontinued secondary to hemorrhagic transformation/intracranial hemorrhage.  Will need outpatient follow-up with hematology.   6.  Abdominal wall hematoma Noted on CT abdomen and pelvis.  Dr. Kathyrn Sheriff did fluid aspiration and seem to be old blood component.  Cultures with no growth to date.  Repeat abdominal ultrasound shows a complex aroma versus liquefied hematoma.  7.  Seizure-like activity Patient underwent EEG that showed continuous slowing, excessive beta activity likely secondary to acute CVA.  Patient underwent long-term EEG that showed cortical dysfunction in the right frontal region.  No noted seizures overnight.  Patient placed on seizure precautions.  Continue Keppra.  8.  Pseudomonas UTI  Status post full course of IV antibiotics with Zosyn.  9.  Hyperlipidemia LDL noted to be at 83.  Goal LDL less than 70.  Statin discontinued secondary to transaminitis.  Consider resumption of statin on discharge if LFTs have trended down.  10.  Dysphagia Secondary to acute CVA.  Patient has been assessed by speech therapy and patient on a dysphagia 1 diet.  Patient still receiving tube feeds.   11.  Hypokalemia Potassium at 3.5.  KCl 10 mEq IV every 1 hour x4 runs.    DVT prophylaxis: SCDs Code Status: Full Family Communication: No family at bedside. Disposition Plan: SNF   Consultants:   Palliative care: Dr. Rowe Pavy 08/23/2019  ID: Dr. Tommy Medal 08/20/2019  ID: Dr. Prince Rome 08/05/2019  Oncology: Dr. Irene Limbo 08/03/2019  [facility]: Dr. Lorin Mercy 08/02/2019  Rehab medicine Dr. Posey Pronto 07/27/2019  Neurosurgery: Dr. Kathyrn Sheriff 07/19/2019  PCCM: Dr Nelda Marseille  07/19/2019  Procedures:   - CT head 08/20/2019, 08/18/2019, 08/16/2019, 08/14/2019, 08/11/2019, 08/01/2019, 07/30/2019, 07/23/2019 CT angios head and neck 07/21/2019 CT angiogram chest 08/01/2019 CT abdomen and pelvis 08/04/2019 Abdominal ultrasound 08/11/2019 2D echo 07/20/2019,  2D echo bubble study 08/03/2019 Lower extremity Dopplers 07/21/2019, 08/02/2019  MRI head 08/20/2019, 07/19/2019 Modified barium swallow 08/25/2019 MRA head and neck 07/19/2019  Antimicrobials:   IV vancomycin 08/20/2019  IV cefepime 08/20/2019  IV Zosyn 08/15/2019>>>> 08/20/2019  IV Flagyl 08/22/2019 x 1   Subjective: Patient sitting up in bed more alert and somewhat interactive and being fed by RN at bedside.   Objective: Vitals:   08/26/19 0800 08/26/19 1000 08/26/19 1104 08/26/19 1200  BP: (!) 125/91 (!) 133/96 (!) 134/97 138/89  Pulse: 90 95 96 100  Resp: 11 18 20 15   Temp:   98.7 F (37.1 C)   TempSrc:   Oral   SpO2: 98% 99% 100% 100%  Weight:      Height:        Intake/Output Summary (Last 24 hours) at 08/26/2019 1230 Last data filed at 08/26/2019 1151 Gross per 24 hour  Intake 3453.06 ml  Output 2951 ml  Net 502.06 ml   Filed Weights   08/24/19 0500 08/25/19 0500 08/26/19 0500  Weight: 92.4 kg 92.3 kg 91.9 kg    Examination:  General exam: NAD.  Right hemicraniectomy surgical incision on scalp with bulging on the right. Respiratory system: CTAB anterior lung fields.  No wheezes, no crackles,  no rhonchi.  Cardiovascular system: RRR no murmurs rubs or gallops.  Gastrointestinal system: Abdomen is nontender, nondistended, soft, positive bowel sounds.  No rebound.  No guarding.   Central nervous system: Alert and following some commands.  Nonverbal.  Moving right upper extremity and right lower extremity to commands.  Right gaze position.  Left sided weakness.  Extremities: Symmetric 5 x 5 power. Skin: No rashes, lesions or ulcers Psychiatry: Judgement and insight appear normal. Mood & affect  appropriate.     Data Reviewed: I have personally reviewed following labs and imaging studies  CBC: Recent Labs  Lab 08/20/19 0411 08/23/19 0800 08/25/19 0524 08/26/19 0515  WBC 10.0 8.5 8.3 8.1  NEUTROABS  --   --   --  5.4  HGB 10.6* 10.5* 10.6* 10.4*  HCT 32.1* 33.0* 32.8* 32.3*  MCV 86.3 86.6 85.6 85.9  PLT 218 234 278 [id number]   Basic Metabolic Panel: Recent Labs  Lab 08/20/19 0411 08/22/19 0442 08/23/19 0800 08/25/19 0524 08/26/19 0515  NA 143 141 137 135 135  K 3.5 3.2* 3.8 3.3* 3.5  CL 112* 111 105 103 103  CO2 21* 21* 22 22 22   GLUCOSE 111* 126* 125* 122* 107*  BUN 18 16 16 14 13   CREATININE 0.93 0.71 0.42* 0.63 0.61  CALCIUM 8.4* 7.8* 8.7* 8.6* 8.3*   GFR: Estimated Creatinine Clearance: 140.8 mL/min (by C-G formula based on SCr of 0.61 mg/dL). Liver Function Tests: Recent Labs  Lab 08/22/19 0442  AST 36  ALT 53*  ALKPHOS 86  BILITOT 0.6  PROT 6.1*  ALBUMIN 2.5*   No results for input(s): LIPASE, AMYLASE in the last 168 hours. No results for input(s): AMMONIA in the last 168 hours. Coagulation Profile: No results for input(s): INR, PROTIME in the last 168 hours. Cardiac Enzymes: No results for input(s): CKTOTAL, CKMB, CKMBINDEX, TROPONINI in the last 168 hours. BNP (last 3 results) No results for input(s): PROBNP in the last 8760 hours. HbA1C: No results for input(s): HGBA1C in the last 72 hours. CBG: Recent Labs  Lab 08/25/19 1947 08/25/19 2332 08/26/19 0353 08/26/19 0811 08/26/19 1145  GLUCAP 125* 112* 97 108* 118*   Lipid Profile: No results for input(s): CHOL, HDL, LDLCALC, TRIG, CHOLHDL, LDLDIRECT in the last 72 hours. Thyroid Function Tests: No results for input(s): TSH, T4TOTAL, FREET4, T3FREE, THYROIDAB in the last 72 hours. Anemia Panel: No results for input(s): VITAMINB12, FOLATE, FERRITIN, TIBC, IRON, RETICCTPCT in the last 72 hours. Sepsis Labs: No results for input(s): PROCALCITON, LATICACIDVEN in the last 168  hours.  Recent Results (from the past 240 hour(s))  Culture, blood (Routine X 2) w Reflex to ID Panel     Status: None   Collection Time: 08/17/19 10:19 AM   Specimen: BLOOD  Result Value Ref Range Status   Specimen Description BLOOD LEFT ANTECUBITAL  Final   Special Requests   Final    BOTTLES DRAWN AEROBIC ONLY Blood Culture adequate volume   Culture   Final    NO GROWTH 5 DAYS Performed at [facility], [address], [place], [place] [address]    Report Status 08/22/2019 FINAL  Final  Culture, blood (Routine X 2) w Reflex to ID Panel     Status: None   Collection Time: 08/17/19 10:27 AM   Specimen: BLOOD LEFT HAND  Result Value Ref Range Status   Specimen Description BLOOD LEFT HAND  Final   Special Requests   Final    BOTTLES DRAWN AEROBIC  ONLY Blood Culture results may not be optimal due to an inadequate volume of blood received in culture bottles   Culture   Final    NO GROWTH 5 DAYS Performed at [facility] [address] [place] [place], [place] [address]    Report Status 08/22/2019 FINAL  Final  Surgical PCR screen     Status: Abnormal   Collection Time: 08/21/19  1:50 AM   Specimen: Nasal Mucosa; Nasal Swab  Result Value Ref Range Status   MRSA, PCR NEGATIVE NEGATIVE Final   Staphylococcus aureus POSITIVE (A) NEGATIVE Final    Comment: CRITICAL RESULT CALLED TO, READ BACK BY AND VERIFIED WITH: RN, TRosanne Sack ET:[id number] @0423  THANEY Performed at [facility] [address], [place], [place] [address]   Body fluid culture     Status: None   Collection Time: 08/21/19  8:35 AM   Specimen: Abscess; Body Fluid  Result Value Ref Range Status   Specimen Description ABSCESS EPIDURAL  Final   Special Requests NONE  Final   Gram Stain   Final    MODERATE WBC PRESENT, PREDOMINANTLY PMN NO ORGANISMS SEEN    Culture   Final    NO GROWTH 3 DAYS Performed at [facility], 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 Past Surgical History: Past Surgical History: Procedure Laterality Date  CRANIOTOMY Right 07/19/2019  Procedure: RIGHT HEMI-CRANIECTOMY With implantation of skull flap to abdominal wall;  Surgeon: Consuella Lose, MD;  Location: [facility];  Service: Neurosurgery;  Laterality: Right;  CYST EXCISION N/A 10/08/2016  Procedure: EXCISION OF POSTERIOR NECK CYST;  Surgeon: Clovis Riley, MD;  Location: [facility];  Service: General;  Laterality: N/A;  INCISION AND DRAINAGE ABSCESS N/A 09/22/2014  Procedure: INCISION AND DRAINAGE ABSCESS POSTERIOR NECK;  Surgeon: Pedro Earls, MD;  Location: [facility];  Service: General;  Laterality: N/A;  INCISION AND DRAINAGE ABSCESS N/A 12/20/2015  Procedure: INCISION AND DRAINAGE POSTERIOR NECK MASS;  Surgeon: Armandina Gemma, MD;  Location: [facility];  Service: General;  Laterality: N/A;  INCISION AND DRAINAGE ABSCESS Left 07/10/2004  middle finger  IR IVC FILTER PLMT / S&I /IMG GUID/MOD SED  07/21/2019  IR VENOGRAM RENAL UNI RIGHT  07/21/2019  MASS EXCISION N/A 07/21/2017  Procedure: EXCISION OF BENIGN NECK LESION WITH LAYERED CLOSURE;  Surgeon: Irene Limbo, MD;  Location: [facility];  Service: Plastics;  Laterality: N/A;  MASS EXCISION N/A 11/10/2017  Procedure: EXCISION BENIGN LESION OF THE NECK WITH LAYERED CLOSURE;  Surgeon: Irene Limbo, MD;  Location: [facility];  Service: Plastics;  Laterality: N/A; HPI: Pt is a 43 y.o. with no known PMH who presents  on 8/25 after being found down at work, nonverbal with L hemiparesis. 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 Stable trace midline shift. S/p hemicraniectomy with abdominal flap implant. Intubated 8/26-8/31. CXR worsened atelectasis in the right lower lobe. MBS completed 08/09/2019 with recommendation  for Dys1/thin liquids. Pt demonstrated increased lethargy and vomiting. Hemorrhagic conversion on CT, transferred back to ICU. Head CT 08/14/19 with increase in hemorrhage, edema, mass effect and compression of ventricle  Subjective: Pt seen in radiology for MBS Assessment / Plan / Recommendation [facility] IP CLINICAL IMPRESSIONS 08/25/2019 Clinical Impression Pt presented alert with R lean with neck flexion. He exhibited oropharyngeal dysphagia characterized by reduced labial seal resulting in anterior spillage, penetration and silent aspiration with thins, and generalized weakness when masticating solids. Trials of nectar thick via spoon and straw were WNL including consecutive sips. Puree WNL, timely swallow with no oral or pharyngeal residue. Thin trials via straw resulted in 2 instances of penetration to vocal folds and one instance of silent aspiration. Pt unable to elicit cough on command. Did not attempt compensatory strategies given limited neck ROM. Mechanical soft solids revealed oral weakness with prolonged mastication (1-2 mins for small bite) and moderate lingual residue following initial delayed swallow. Pt stimuable for second swallow with dry spoon and cleared remaining residue. Esophageal backflow into cervical esophagus following final trial of nectar via straw. Esophageal scan revealed possible reduced peristalsis. Recommend Dys 1 with Nectar thick liquids, meds crushed in puree. Full staff supervision with assist for self-feeding necessary to ensure pt is taking appropriately sized sips/bites with clearance of pocketing. SLP will continue to follow for diet  toleration. SLP Visit Diagnosis Dysphagia, oropharyngeal phase (R13.12) Attention and concentration deficit following -- Frontal lobe and executive function deficit following -- Impact on safety and function Moderate aspiration risk   CH[facility] IP TREATMENT RECOMMENDATION 08/25/2019 Treatment Recommendations Therapy as outlined in treatment plan below   Prognosis 08/25/2019 Prognosis for Safe Diet Advancement Good Barriers to Reach Goals Severity of deficits Barriers/Prognosis Comment -- [facility] IP DIET RECOMMENDATION 08/25/2019 SLP Diet Recommendations Dysphagia 1 (Puree) solids;Nectar thick liquid Liquid Administration via Straw;Cup Medication Administration Crushed with puree Compensations Minimize environmental distractions;Slow rate;Small sips/bites;Lingual sweep for clearance of pocketing Postural Changes Seated upright at 90 degrees   CH[facility] IP OTHER RECOMMENDATIONS 08/25/2019 Recommended Consults -- Oral Care Recommendations Oral care BID;Staff/trained caregiver to provide oral care Other Recommendations Order thickener from pharmacy   [facility] IP FOLLOW UP RECOMMENDATIONS 08/25/2019 Follow up Recommendations Skilled Nursing facility   [facility] IP FREQUENCY AND DURATION 08/25/2019 Speech Therapy Frequency (ACUTE ONLY) min 2x/week Treatment Duration 2 weeks      [facility] IP ORAL PHASE 08/25/2019 Oral Phase Impaired Oral - Pudding Teaspoon -- Oral - Pudding Cup -- Oral - Honey Teaspoon -- Oral - Honey Cup -- Oral - Nectar Teaspoon Other (Comment) Oral - Nectar Cup -- Oral - Nectar Straw Other (Comment) Oral - Thin Teaspoon -- Oral - Thin Cup -- Oral - Thin Straw WFL Oral - Puree Other (Comment) Oral - Mech Soft Weak lingual manipulation;Lingual/palatal residue;Delayed oral transit Oral - Regular -- Oral - Multi-Consistency -- Oral - Pill NT Oral Phase - Comment --  CH[facility] IP PHARYNGEAL PHASE 08/25/2019 Pharyngeal Phase Impaired Pharyngeal- Pudding Teaspoon -- Pharyngeal -- Pharyngeal- Pudding Cup -- Pharyngeal -- Pharyngeal- Honey Teaspoon --  Pharyngeal -- Pharyngeal- Honey Cup -- Pharyngeal -- Pharyngeal- Nectar Teaspoon Delayed swallow initiation-vallecula Pharyngeal -- Pharyngeal- Nectar Cup -- Pharyngeal -- Pharyngeal- Nectar Straw Delayed swallow initiation-vallecula Pharyngeal -- Pharyngeal- Thin Teaspoon -- Pharyngeal -- Pharyngeal- Thin Cup Penetration/Aspiration during swallow Pharyngeal Material enters airway, passes BELOW cords without attempt by patient to eject out (silent aspiration) Pharyngeal- Thin Straw Penetration/Aspiration during swallow Pharyngeal Material enters airway, CONTACTS cords and then ejected out;Material enters airway,  passes BELOW cords without attempt by patient to eject out (silent aspiration) Pharyngeal- Puree WFL Pharyngeal -- Pharyngeal- Mechanical Soft Delayed swallow initiation-vallecula Pharyngeal -- Pharyngeal- Regular -- Pharyngeal -- Pharyngeal- Multi-consistency -- Pharyngeal -- Pharyngeal- Pill NT Pharyngeal -- Pharyngeal Comment --  CH[facility] IP CERVICAL ESOPHAGEAL PHASE 08/25/2019 Cervical Esophageal Phase Impaired Pudding Teaspoon -- Pudding Cup -- Honey Teaspoon -- Honey Cup -- Nectar Teaspoon -- Nectar Cup -- Nectar Straw Esophageal backflow into cervical esophagus Thin Teaspoon -- Thin Cup -- Thin Straw -- Puree -- Mechanical Soft -- Regular -- Multi-consistency -- Pill -- Cervical Esophageal Comment -- Houston Siren 08/25/2019, 4:23 PM                   Scheduled Meds:  acetaminophen  650 mg Oral Q4H   Or   acetaminophen (TYLENOL) oral liquid 160 mg/5 mL  650 mg Per Tube Q4H   Or   acetaminophen  650 mg Rectal Q4H   chlorhexidine  15 mL Mouth Rinse BID   Chlorhexidine Gluconate Cloth  6 each Topical Daily   famotidine  20 mg Per Tube BID   feeding supplement (PRO-STAT SUGAR FREE 64)  30 mL Per Tube TID   heparin injection (subcutaneous)  5,000 Units Subcutaneous Q8H   levETIRAcetam  500 mg Per Tube BID   mouth rinse  15 mL Mouth Rinse q12n4p   metoprolol tartrate  50 mg  Per Tube Q8H   sodium chloride flush  10-40 mL Intracatheter Q12H   Continuous Infusions:  sodium chloride 40 mL/hr at 08/26/19 0900   sodium chloride Stopped (08/21/19 2124)   sodium chloride Stopped (08/23/19 0945)   ceFEPime (MAXIPIME) IV 2 g (08/26/19 1012)   feeding supplement (OSMOLITE 1.5 CAL) 1,000 mL (08/26/19 0725)   potassium chloride 10 mEq (08/26/19 1218)   vancomycin Stopped (08/26/19 0656)     LOS: 38 days    Time spent: 40 minutes    Irine Seal, MD [facility]  If 7PM-7AM, please contact night-coverage [email or web address] 08/26/2019, 12:30 PM

## 2019-08-26 NOTE — Progress Notes (Signed)
STROKE TEAM PROGRESS NOTE   INTERVAL HISTORY Patient continues to make slow improvement.  He passed swallow eval and is now on dysphagia diet and able to eat.  He remains afebrile.  Plans are to transfer him to stepdown unit today.  Infectious disease recommended 8 weeks of antibiotics followed by MRI scan of the brain to plan further treatment duration if needed  Vitals:   08/26/19 0517 08/26/19 0600 08/26/19 0700 08/26/19 0800  BP: 131/90 121/84  (!) 125/91  Pulse: (!) 105 97 93 90  Resp:  (!) 21 (!) 21 11  Temp:   99.3 F (37.4 C)   TempSrc:   Axillary   SpO2:  98% 99% 98%  Weight:      Height:        CBC:  Recent Labs  Lab 08/25/19 0524 08/26/19 0515  WBC 8.3 8.1  NEUTROABS  --  5.4  HGB 10.6* 10.4*  HCT 32.8* 32.3*  MCV 85.6 85.9  PLT 278 161    Basic Metabolic Panel:  Recent Labs  Lab 08/25/19 0524 08/26/19 0515  NA 135 135  K 3.3* 3.5  CL 103 103  CO2 22 22  GLUCOSE 122* 107*  BUN 14 13  CREATININE 0.63 0.61  CALCIUM 8.6* 8.3*    IMAGING past 24h Dg Swallowing Func-speech Pathology  Result Date: 08/25/2019 Objective Swallowing Evaluation: Type of Study: MBS-Modified Barium Swallow Study  Patient Details Name: Matthew Black MRN: [id number] Date of Birth: Mar 15, 1976 Today's Date: 08/25/2019 Time: SLP Start Time (ACUTE ONLY): 1135 -SLP Stop Time (ACUTE ONLY): 8119 SLP Time Calculation (min) (ACUTE ONLY): 21 min Past Medical History: Past Medical History: Diagnosis Date . 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 CRANIOTOMY Right 07/19/2019  Procedure: RIGHT HEMI-CRANIECTOMY With implantation of skull flap to abdominal wall;  Surgeon: Consuella Lose, MD;  Location: [place];  Service: Neurosurgery;  Laterality: Right; . CYST EXCISION N/A 10/08/2016  Procedure: EXCISION OF POSTERIOR NECK CYST;  Surgeon: Clovis Riley, MD;  Location: [facility];  Service: General;  Laterality: N/A; . INCISION AND DRAINAGE  ABSCESS N/A 09/22/2014  Procedure: INCISION AND DRAINAGE ABSCESS POSTERIOR NECK;  Surgeon: Pedro Earls, MD;  Location: [facility];  Service: General;  Laterality: N/A; . INCISION AND DRAINAGE ABSCESS N/A 12/20/2015  Procedure: INCISION AND DRAINAGE POSTERIOR NECK MASS;  Surgeon: Armandina Gemma, MD;  Location: [facility];  Service: General;  Laterality: N/A; . INCISION AND DRAINAGE ABSCESS Left 07/10/2004  middle finger . IR IVC FILTER PLMT / S&I /IMG GUID/MOD SED  07/21/2019 . IR VENOGRAM RENAL UNI RIGHT  07/21/2019 . MASS EXCISION N/A 07/21/2017  Procedure: EXCISION OF BENIGN NECK LESION WITH LAYERED CLOSURE;  Surgeon: Irene Limbo, MD;  Location: [place];  Service: Plastics;  Laterality: N/A; . MASS EXCISION N/A 11/10/2017  Procedure: EXCISION BENIGN LESION OF THE NECK WITH LAYERED CLOSURE;  Surgeon: Irene Limbo, MD;  Location: [place];  Service: Plastics;  Laterality: N/A; HPI: Pt is a 43 y.o. with no known PMH who presents on 8/25 after being found down at work, nonverbal with L hemiparesis. 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 Stable trace midline shift. S/p hemicraniectomy with abdominal flap implant. Intubated 8/26-8/31. CXR worsened atelectasis in the right lower lobe. MBS completed 08/09/2019 with recommendation for Dys1/thin liquids. Pt demonstrated increased lethargy and vomiting. Hemorrhagic conversion on CT, transferred back to ICU. Head CT 08/14/19  with increase in hemorrhage, edema, mass effect and compression of ventricle  Subjective: Pt seen in radiology for MBS Assessment / Plan / Recommendation CHL IP CLINICAL IMPRESSIONS 08/25/2019 Clinical Impression Pt presented alert with R lean with neck flexion. He exhibited oropharyngeal dysphagia characterized by reduced labial seal resulting in anterior spillage, penetration and silent aspiration with thins, and generalized weakness when masticating solids. Trials of nectar  thick via spoon and straw were WNL including consecutive sips. Puree WNL, timely swallow with no oral or pharyngeal residue. Thin trials via straw resulted in 2 instances of penetration to vocal folds and one instance of silent aspiration. Pt unable to elicit cough on command. Did not attempt compensatory strategies given limited neck ROM. Mechanical soft solids revealed oral weakness with prolonged mastication (1-2 mins for small bite) and moderate lingual residue following initial delayed swallow. Pt stimuable for second swallow with dry spoon and cleared remaining residue. Esophageal backflow into cervical esophagus following final trial of nectar via straw. Esophageal scan revealed possible reduced peristalsis. Recommend Dys 1 with Nectar thick liquids, meds crushed in puree. Full staff supervision with assist for self-feeding necessary to ensure pt is taking appropriately sized sips/bites with clearance of pocketing. SLP will continue to follow for diet toleration. SLP Visit Diagnosis Dysphagia, oropharyngeal phase (R13.12) Attention and concentration deficit following -- Frontal lobe and executive function deficit following -- Impact on safety and function Moderate aspiration risk   CHL IP TREATMENT RECOMMENDATION 08/25/2019 Treatment Recommendations Therapy as outlined in treatment plan below   Prognosis 08/25/2019 Prognosis for Safe Diet Advancement Good Barriers to Reach Goals Severity of deficits Barriers/Prognosis Comment -- [facility] IP DIET RECOMMENDATION 08/25/2019 SLP Diet Recommendations Dysphagia 1 (Puree) solids;Nectar thick liquid Liquid Administration via Straw;Cup Medication Administration Crushed with puree Compensations Minimize environmental distractions;Slow rate;Small sips/bites;Lingual sweep for clearance of pocketing Postural Changes Seated upright at 90 degrees   CHL IP OTHER RECOMMENDATIONS 08/25/2019 Recommended Consults -- Oral Care Recommendations Oral care BID;Staff/trained caregiver to  provide oral care Other Recommendations Order thickener from pharmacy   [facility] IP FOLLOW UP RECOMMENDATIONS 08/25/2019 Follow up Recommendations Skilled Nursing facility   [facility] IP FREQUENCY AND DURATION 08/25/2019 Speech Therapy Frequency (ACUTE ONLY) min 2x/week Treatment Duration 2 weeks      CH[facility] IP ORAL PHASE 08/25/2019 Oral Phase Impaired Oral - Pudding Teaspoon -- Oral - Pudding Cup -- Oral - Honey Teaspoon -- Oral - Honey Cup -- Oral - Nectar Teaspoon Other (Comment) Oral - Nectar Cup -- Oral - Nectar Straw Other (Comment) Oral - Thin Teaspoon -- Oral - Thin Cup -- Oral - Thin Straw WFL Oral - Puree Other (Comment) Oral - Mech Soft Weak lingual manipulation;Lingual/palatal residue;Delayed oral transit Oral - Regular -- Oral - Multi-Consistency -- Oral - Pill NT Oral Phase - Comment --  [facility] IP PHARYNGEAL PHASE 08/25/2019 Pharyngeal Phase Impaired Pharyngeal- Pudding Teaspoon -- Pharyngeal -- Pharyngeal- Pudding Cup -- Pharyngeal -- Pharyngeal- Honey Teaspoon -- Pharyngeal -- Pharyngeal- Honey Cup -- Pharyngeal -- Pharyngeal- Nectar Teaspoon Delayed swallow initiation-vallecula Pharyngeal -- Pharyngeal- Nectar Cup -- Pharyngeal -- Pharyngeal- Nectar Straw Delayed swallow initiation-vallecula Pharyngeal -- Pharyngeal- Thin Teaspoon -- Pharyngeal -- Pharyngeal- Thin Cup Penetration/Aspiration during swallow Pharyngeal Material enters airway, passes BELOW cords without attempt by patient to eject out (silent aspiration) Pharyngeal- Thin Straw Penetration/Aspiration during swallow Pharyngeal Material enters airway, CONTACTS cords and then ejected out;Material enters airway, passes BELOW cords without attempt by patient to eject out (silent aspiration) Pharyngeal- Puree WFL Pharyngeal -- Pharyngeal- Mechanical Soft  Delayed swallow initiation-vallecula Pharyngeal -- Pharyngeal- Regular -- Pharyngeal -- Pharyngeal- Multi-consistency -- Pharyngeal -- Pharyngeal- Pill NT Pharyngeal -- Pharyngeal Comment --  [facility] IP CERVICAL  ESOPHAGEAL PHASE 08/25/2019 Cervical Esophageal Phase Impaired Pudding Teaspoon -- Pudding Cup -- Honey Teaspoon -- Honey Cup -- Nectar Teaspoon -- Nectar Cup -- Nectar Straw Esophageal backflow into cervical esophagus Thin Teaspoon -- Thin Cup -- Thin Straw -- Puree -- Mechanical Soft -- Regular -- Multi-consistency -- Pill -- Cervical Esophageal Comment -- Houston Siren 08/25/2019, 4:23 PM                PHYSICAL EXAM       General- Well nourished, well developed, middle-aged African-American male. He has right hemicraniectomy surgical incision on the scalp, bulging.  Right head bulging from crani site.  Bandage on the surgical site.  Ophthalmologic- fundi not visualized due to noncooperation.  Cardiovascular - Regular rhythm and rate.  Neuro - patient is awake and interactive.  Nonverbal,  following commands on the right side and moves right upper and lower extremity purposefully to command.  Eyes in right gaze position, does not cross midline, not blinking to visual threat to the left, PERRL. Left facial droop. Tongue protrusion not corporative. Has spontaneous movement of RUE against gravity and RLE in bed 3/5 and moving spontaneously. On pain stimulation, mild withdraw of LLE, but no movement of LUE. DTR 1+ and toes equiv. Sensation, coordination and gait not tested.   ASSESSMENT/PLAN Matthew Black is a 43 y.o. male with no significant past medical history found down x 2 days nonverbal with L hemiparesis.   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 Trace L midline shift. ELVO at R ICA, R A1, R MCA.  MRI  Large R brain infarct sparing R PCA. Cytotoxic edema but no  hemorrhage. Stable trace midline shift.  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 R ICA terminus, R ACA, R MCA occluded.   CT head similar w/ low density and sweddling R ACA and MCA territories, now with 5-3m midline shift. no hemorrhage  CT head acute R ACA and MCA infarct w/ progressive swelling decompressed through craniectomy defect.   CTA head and neck stable MLS. R ICA occlusion in neck that continues into R ACA and MCA. Mild petechial hemorrhage at basal ganglia  CT Head 8/29 - Unchanged appearance of massive right MCA and ACA territory infarcts with parenchyma extending through decompressive craniectomy. Small focus of suspected hemorrhage adjacent to the right caudate head.  CT head 07/30/19 - New hematoma within the infarcted tissue   CT head 08/01/2019 stable w/o change  CT head 08/11/19 - new patchy hemorrhage along periphery of infarcted R frontoparietal lobe (likely HT) decreased dominant anterior margin hemorrhage. Stable herniation of infarcted brain through crani site.   CT head 9/20 - worsening with increased HT on the right and new IVH  CT head 08/15/19 - slightly worsened HT from previous.   CT head 9/22 hemorrhagic R ACA and MCA territory infarcts with edematous brain bulging through Crani defect.  No change x2 days  CT head 9/24 stable hematoma, increased right hemisphere mass-effect, external herniation from Crani  site, making right lateral ventriculomegaly than prior images.  CT head 9/26   MRI brain 9/26 R brain infarct. Decompressive crani w/ herniation of brain into site. Overlying subgaleal fluid collection on the R. Large irreg fluid collection antioerio marigin of crani->abscess. Large hemorrhage into R temporoparietal infarct. Mild ventricular enlargement.  2D Echo EF 60-65%  2D echo w/ bubble ordered no LRS   LE venous doppler DVT in R peroneal, L popliteal, L posterior tibial and L peroneal  LE venous doppler repeat DVT in R peroneal, L popliteal, L  posterior tibial and L peroneal  TCD bubble no HITS, no PFO  LDL 83  HgbA1c 5.4  UDS positive THC  Hypercoagulable and autoimmune work up negative  Add Heparin 5000 units sq tid for VTE prophylaxis  No antithrombotic prior to admission, treated DVT with heparin IV, then  discontinued due to hemorrhagic conversion with right frontal HT, then resumed heparin IV given extensive DVT and PE, heparin IV again discontinued due to worsening HT  Therapy recommendations:  SNF (reassessed by CIR and declined again 9/16)  Disposition:  Pending  Palliative Care consulted met with wife 9/29 - wife wants full aggressive care   Transfer to progressive unit (order placed 9/29). 4NP being considered by 4N and MD  Right ICA occlusion  MRA and CTA showed right ICA occlusion from origin to terminal  Wife denies any head trauma  Wife stated that pt had recent aggressive exercise with weight lifting  Concerning dissection as working diagnosis of stroke etiology   Cyctotoxic cerebral edema  S/p R decompressive hemicraniectomy (Nundkumar) w/ flap R abd 07/19/2019  PICC placed - keep for now per Leonie Man  Given One dose of 23.4% 8/26; 3%  off 8/30 1700  CT repeat 07/30/19 - New parenchymal hemorrhage within the infarcted tissue  Off helmet to further release pressure  CT repeat 08/01/2019 stable w/o change  CT head repeat 08/11/19 - new patchy hemorrhage along periphery of infarcted R frontoparietal lobe (likely HT) but previous right frontal hematoma now much reduced in size  CT head 9/20 worsening right HT with new ICH and IVH  CT Head 9/21 - slightly worsened HT from previous.   CT head 9/22 hemorrhagic R ACA and MCA territory infarcts with edematous brain bulging through Crani defect.  No change x2 days  CT head 9/24 stable hematoma, increased right hemisphere mass-effect, external herniation from Crani site, making right lateral ventriculomegaly than prior images. - discussed with Dr.  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 Correlation with physical exam and fluid sampling suggested for further evaluation.  MRI W&WO - 08/20/19 - ...herniation of brain into the craniectomy site. Large irregularly enhancing fluid collection along the anterior margin of the craniectomy measuring approximately 5.4 x 9.8 cm. This may represent a large abscess....sterile hematoma versus infected hematoma.   Dr Kathyrn Sheriff aspirated 9/27 and sent for cx. Not a surgical candidate d/t ris increasing herniation. .  Cx moderate WBC, no organisms - no growth < 24h  Tmax - 100.5  WBC - 8.1  ID on board   Maxipime, Vancomycin 9/26>> (plan tx x 8 weeks)  Repeat MRI prior to stopping therapy after 8 weeks of vancomycin/cefepime  Notify ID once pt close to d/c  SIRS/fever/leukocytosis due to necrotic brain tissue  Tmax - 100.5  WBC - 8.1  CXR x 3 NAD  Blood cultures - neg, repeated again 08/04/19 -> no growth on empiric unasyn -> changed to vanco, cefepime and flagyl 08/05/19->now off  Lactic acid 1.2->2.0  Hepatitis panel 9/12 - neg  procalcitonin and lactic acid normal   As no longer a central neurogenic fever, weaning:  Decreased baclofen 10 mg bid, bromocriptine 5 mg q8h, and d/c buspar - d/c baclofen and bromocriptine completely 10/2  B LE DVT Small LLL PE  LE venous doppler DVT in R peroneal, L popliteal, L posterior tibial and L peroneal  Etiology unclear  Started IV heparin 07/26/2019  IVC filter placed 8/27. Plan retrieval in 8-12 weeks as an IP in an IR clinic  Hypercoagulable work up negative   LE venous doppler 08/02/19 repeat extensive DVT in R peroneal, L popliteal, L posterior tibial and L peroneal up to the IVC filter  CT chest  Nonocclusive thrombus seen within the left posterior lower lobe segmental artery.   Treated with IV Heparin but then off secondary to intracranial hemorrhage, then restarted IV heparin given extensive DVT and PE but again stopped due to worsening HT on 9/20  Hematology consult, appreciate help - hypercoag w/u neg.  At risk for recurrent clots   Factor V Leiden, homocysteine, Prot C&S, Prothombin gene mutation all neg.   Abdominal wall hematoma  Large fluid collection seen on CT abdomen  CT abd/pel concerning for abdominal wall fluid collection likely old hematoma  Dr. Kathyrn Sheriff did fluid aspiration and seems to be old blood component  Fluid aspiration sent for culture 9/11 - no growth < 24 hrs  Repeat ABD [place] complex seroma vs liquified hematoma R abd similar to previous  Seizure-like activity  EEG continuous slowing, excessive beta activity, sleep spindle asymmetry decreased R->related to stroke and sedation. No SZ  Long Term EEG cortical dysfunction in right frontal region  Seizure precautions  Continue Keppra   UTI, resolved  Urine culture pseudomonas - treated with  to zosyn  UA 9/21 - WBC 0-5 negative   UA 9/23 - WBC 11-20  Repeat B Cx no growth  Tachycardia, resolved  HR normal now  Likely due to fever  Hydration with IVF, TF and free water  On metoprolol to 50 Q8h  EKG ST, rate 120 w/ possible LA enlargement  Troponin series neg x 3    Hyperlipidemia  Home meds:  no statin  LDL 83, goal < 70  Was on lipitor 20 mg daily   D/c statin due to elevated LFT  Consider statin at d/c if LFTs down  Elevated LFT, almost resolved  AST - 105-118-98-85-38-36  ALT - 80-68-65-164-64-53  CK 2063-852 -248 176 0550  Hepatitis panel 9/12 - neg  Dysphagia . Secondary to stroke . Dysphagia 1 diet with thin liquid -> NPO due to neuro worsening   IVF @ 50cc total  Cleared for Dysphagia 1 nectar thick liquids 10/1  Consider for hx TF only once  intake increases. Current intake limited by exhaustion  Speech on board  Other Stroke Risk Factors  Former Cigarette smoker, quit 3 yrs ago  ETOH use  Substance abuse UDS - positive for THC   Obesity, Body mass index is 28.26 kg/m., slightly overweight, recommend weight loss, diet and exercise as appropriate   Other Active Problems  Hypokalemia 3.2 - replaced   Normocytic anemia - hematology felt d/t ongoing blood draws Hgb 10.4  Hospital day # 38  Continue to encourage  oral feeds during the day while awake and switch tube feeding to nighttime only for a few days.  If patient continues to eat satisfactorily may discontinue tube feeds after 2 3 days.  Discontinue bromocriptine as he has been afebrile now for a few days.  Continue ongoing therapies.  Anticipate transfer to rehabilitation next week.  Greater than 50% time during this 35-minute visit was spent on counseling and coordination of care and discussion with care team.  Antony Contras, MD     To contact Stroke Continuity provider, please refer to [email or web address] After hours, contact General Neurology

## 2019-08-26 NOTE — Progress Notes (Signed)
Physical Therapy Treatment Patient Details Name: Matthew Black MRN: [id number] DOB: 1976/07/06 Today's Date: 08/26/2019    History of Present Illness Pt is a 43 y.o. M with no known PMH who presents on 8/25 after being found down at work, nonverbal with L hemiparesis. CT head showing large R hemisphere infarct with edema and mild mass effect. 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 Stable trace midline shift. S/p hemicraniectomy for  edema with abdominal flap implant. BLE DVT vasc US 8/28: acute DVT bilateral lower extremities, s/p IVC placement.  On 9/21 Repeat CT stable large hemorrhagic conversion.    PT Comments    Pt consitently following one step commands for me on his right side today.  He fully participated in EOB session working on sitting balance, head and neck ROM, trunk rotation, R LE exercises.  He worked for >30 mins.  Wife came in at the end of the session and was very supportive and encouraging.  Goals updated today.  PT will continue to follow acutely for safe mobility progression  Follow Up Recommendations  SNF     Equipment Recommendations  Wheelchair (measurements PT);Wheelchair cushion (measurements PT);Hospital bed;Other (comment)    Recommendations for Other Services   NA     Precautions / Restrictions Precautions Precautions: Fall Precaution Comments: L hemiparesis, R skull missing, abd bone flap Required Braces or Orthoses: Other Brace Other Brace: helmet (not wearing due to presses on edema on R side of head)    Mobility  Bed Mobility Overal bed mobility: Needs Assistance Bed Mobility: Rolling;Sidelying to Sit Rolling: Max assist Sidelying to sit: Max assist       General bed mobility comments: Max assist to roll to the left hand over hand assist to find rail, but once found he held onto it.  Max assist to support trunk during transition to sitting, pt reaching for footboard once up.       Modified  Rankin (Stroke Patients Only) Modified Rankin (Stroke Patients Only) Pre-Morbid Rankin Score: No symptoms Modified Rankin: Severe disability     Balance Overall balance assessment: Needs assistance Sitting-balance support: Feet supported;Single extremity supported Sitting balance-Leahy Scale: Zero Sitting balance - Comments: up to max assist seated EOB, but able to get him to reach for footboard and help maintain midline sitting balance.  We worked for >30 mins on midline sitting posture, head and neck ROM, stretching back due to rounded posture, crossing midline with trunk and UE rotations and following commands with R UE and LE.   Postural control: Left lateral lean                                  Cognition Arousal/Alertness: Awake/alert Behavior During Therapy: Flat affect Overall Cognitive Status: Impaired/Different from baseline Area of Impairment: Following commands;Problem solving                   Current Attention Level: Sustained   Following Commands: Follows one step commands with increased time   Awareness: Intellectual Problem Solving: Slow processing;Difficulty sequencing;Requires verbal cues;Requires tactile cues General Comments: Continued significant R gaze, able to follow simple commands to the best of his physical ability consistently.       Exercises General Exercises - Lower Extremity Long Arc Quad: AROM;Right;10 reps Hip Flexion/Marching: AROM;Right;10 reps        Pertinent Vitals/Pain Pain Assessment: Faces Faces Pain Scale: Hurts little  more Pain Location: grimacing with head and neck ROM Pain Descriptors / Indicators: Grimacing;Discomfort Pain Intervention(s): Limited activity within patient's tolerance;Monitored during session;Repositioned           PT Goals (current goals can now be found in the care plan section) Acute Rehab PT Goals Patient Stated Goal: unable PT Goal Formulation: With family Time For Goal  Achievement: 09/09/19 Potential to Achieve Goals: Good Progress towards PT goals: Progressing toward goals    Frequency    Min 3X/week      PT Plan Current plan remains appropriate       AM-PAC PT "6 Clicks" Mobility   Outcome Measure  Help needed turning from your back to your side while in a flat bed without using bedrails?: Total Help needed moving from lying on your back to sitting on the side of a flat bed without using bedrails?: Total Help needed moving to and from a bed to a chair (including a wheelchair)?: Total Help needed standing up from a chair using your arms (e.g., wheelchair or bedside chair)?: Total Help needed to walk in hospital room?: Total Help needed climbing 3-5 steps with a railing? : Total 6 Click Score: 6    End of Session   Activity Tolerance: Patient tolerated treatment well Patient left: in bed;with call bell/phone within reach;with bed alarm set   PT Visit Diagnosis: Hemiplegia and hemiparesis;Other symptoms and signs involving the nervous system (R29.898);Other abnormalities of gait and mobility (R26.89) Hemiplegia - Right/Left: Left Hemiplegia - dominant/non-dominant: Non-dominant Hemiplegia - caused by: Cerebral infarction     Time: [id number] PT Time Calculation (min) (ACUTE ONLY): 34 min  Charges:  $Therapeutic Activity: 8-22 mins $Neuromuscular Re-education: 8-22 mins          Jonice Cerra B. Wilburt Messina, PT, DPT  Acute Rehabilitation [phone] pager [phone] office  @ [name]: [phone]             08/26/2019, 4:27 PM

## 2019-08-26 NOTE — Progress Notes (Signed)
  Speech Language Pathology Treatment: Dysphagia;Cognitive-Linquistic  Patient Details Name: Matthew Black MRN: [id number] DOB: 09-11-1976 Today's Date: 08/26/2019 Time: [id number] SLP Time Calculation (min) (ACUTE ONLY): 28 min  Assessment / Plan / Recommendation Clinical Impression  Matthew Black has made great gains past two days with all goals. SLP provided assist with dysphagia therapy for precautions, safe feeding/consumption and education. Can feed himself after assist to initiate, control bite size as he loads spoon and would likely not clear oral cavity if not for tactile cues. Waxing/ waning attention. significant left neglect and decreased initiation necessitates intermittent verbal/visual cues to pick up utensil. She did not pocket puree consistency during session. No cough or throat clearing present.   He followed one step commands consisently with less delays compared to prior sessions. Max visual cues, tactile manipulation of lips with automatic/familiar songs/utterances could not elicit labial movement or onset of phonation today however will continue strategies to promote.   HPI HPI: Pt is a 43 y.o. with no known PMH who presents on 8/25 after being found down at work, nonverbal with L hemiparesis. 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 Stable trace midline shift. S/p hemicraniectomy with abdominal flap implant. Intubated 8/26-8/31. CXR worsened atelectasis in the right lower lobe. MBS completed 08/09/2019 with recommendation for Dys1/thin liquids. Pt demonstrated increased lethargy and vomiting. Hemorrhagic conversion on CT, transferred back to ICU. Head CT 08/14/19 with increase in hemorrhage, edema, mass effect and compression of ventricle      SLP Plan  Continue with current plan of care       Recommendations  Diet recommendations: Dysphagia 1 (puree);Nectar-thick liquid Liquids provided via: Cup Medication Administration: Crushed with  puree Supervision: Staff to assist with self feeding;Full supervision/cueing for compensatory strategies;Patient able to self feed Compensations: Minimize environmental distractions;Slow rate;Small sips/bites;Lingual sweep for clearance of pocketing Postural Changes and/or Swallow Maneuvers: Seated upright 90 degrees                General recommendations: Rehab consult Oral Care Recommendations: Oral care BID Follow up Recommendations: Inpatient Rehab(he is improving-could he go to CIR?) SLP Visit Diagnosis: Dysphagia, pharyngoesophageal phase (R13.14);Cognitive communication deficit [id number]) Plan: Continue with current plan of care       GO                Houston Siren 08/26/2019, 11:09 AM  Orbie Pyo Colvin Caroli.Ed Risk analyst [phone] Office [phone]

## 2019-08-27 LAB — CBC
HCT: 34.2 % — ABNORMAL LOW (ref 39.0–52.0)
Hemoglobin: 11 g/dL — ABNORMAL LOW (ref 13.0–17.0)
MCH: 27.8 pg (ref 26.0–34.0)
MCHC: 32.2 g/dL (ref 30.0–36.0)
MCV: 86.4 fL (ref 80.0–100.0)
Platelets: 299 10*3/uL (ref 150–400)
RBC: 3.96 MIL/uL — ABNORMAL LOW (ref 4.22–5.81)
RDW: 14.7 % (ref 11.5–15.5)
WBC: 7.9 10*3/uL (ref 4.0–10.5)
nRBC: 0 % (ref 0.0–0.2)

## 2019-08-27 LAB — BASIC METABOLIC PANEL
Anion gap: 9 (ref 5–15)
BUN: 13 mg/dL (ref 6–20)
CO2: 24 mmol/L (ref 22–32)
Calcium: 8.7 mg/dL — ABNORMAL LOW (ref 8.9–10.3)
Chloride: 102 mmol/L (ref 98–111)
Creatinine, Ser: 0.65 mg/dL (ref 0.61–1.24)
GFR calc Af Amer: >60 mL/min (ref >60)
GFR calc non Af Amer: >60 mL/min (ref >60)
Glucose, Bld: 117 mg/dL — ABNORMAL HIGH (ref 70–99)
Potassium: 3.6 mmol/L (ref 3.5–5.1)
Sodium: 135 mmol/L (ref 135–145)

## 2019-08-27 LAB — GLUCOSE, CAPILLARY
Glucose-Capillary: 105 mg/dL — ABNORMAL HIGH (ref 70–99)
Glucose-Capillary: 106 mg/dL — ABNORMAL HIGH (ref 70–99)
Glucose-Capillary: 109 mg/dL — ABNORMAL HIGH (ref 70–99)
Glucose-Capillary: 128 mg/dL — ABNORMAL HIGH (ref 70–99)
Glucose-Capillary: 96 mg/dL (ref 70–99)
Glucose-Capillary: 98 mg/dL (ref 70–99)
Glucose-Capillary: 98 mg/dL (ref 70–99)

## 2019-08-27 NOTE — Progress Notes (Signed)
PROGRESS NOTE    Matthew Black  [id number] DOB: 05-22-1976 DOA: 07/19/2019 PCP: Colon Branch, MD    Brief Narrative:  Patient 43 year old gentleman with no significant past medical history who was found down x2 days nonverbal with left hemiparesis.  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  Patient status post hemicraniectomy with abdominal flap implant.  Patient subsequently underwent a hemorrhagic conversion with hematoma 07/30/2019 which worsened on 9/20 with right infarct area.  Patient was seen and followed by neurology in the ICU.  Patient subsequently underwent right decompressive hemicraniectomy by Dr. Kathyrn Sheriff with flap on 07/19/2019. 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  Patient underwent aspiration on 08/21/2019 per Dr. Kathyrn Sheriff and cultures sent.  Patient noted to be not a surgical candidate due to increasing herniation.  ID was consulted and patient started empirically on IV vancomycin IV cefepime. Patient also noted to have a bilateral lower extremity DVT and small left lower lobe PE per venous Dopplers and CT angiogram.  Heparin was started on 07/26/2019.  IVC filter placed 07/21/2019 with plan retrieval 8 to 12 weeks if patient still hospitalized or in the outpatient IR clinic.  Hypercoagulable work-up done was negative.  Oncology consulted.  Patient was on IV heparin however subsequently discontinued due to intracranial hemorrhage and then subsequently resumed given extensive DVT and PE but then subsequently discontinued due to worsening hemorrhage on 08/14/2019.  Patient also noted to have abdominal wall hematoma.  Patient  also noted to have some seizure-like activity subsequently placed on seizure precautions and on Keppra.  Patient status post treatment of Pseudomonas UTI with Zosyn.  Noted with dysphagia with speech therapy following.  Patient subsequently transferred to tried service on 08/25/2019 with neurology following.   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  Questionable etiology per neurology concerned that patient may have had a possible right ICA dissection from exercise.  Patient noted to have hemorrhagic conversion with hematoma 07/30/2019 with worsening on 08/14/2019 in the right infarct. Patient followed by neurology throughout the hospitalization.  Patient had head CT done that showed a large right brain infarct with edema right ACA and MCA territories.  MRI showed large right brain infarct with cytotoxic edema but no hemorrhage.  MRA head and neck showed right ICA occluded at origin.  Right ICA terminus, R ACA, R MCA occluded.  Head CT which was done showed 5 to 6 mm midline shift with no hemorrhage.  Patient underwent acute head CT that showed  progression with swelling decompressive craniectomy defect.  CTA head and neck was done that showed right ICA occlusion neck that continues into right ACA and MCA. CT head done 07/23/2019 showed unchanged  appearance of massive right MCA and ACA territory infarct with parenchyma extending through decompressive craniectomy.  Small focus of suspected hemorrhage adjacent to right caudate head. CT head done 07/30/2019 with new hematoma within infarcted tissue. CT head done 08/01/2019 with no significant change. CT head done 08/11/2019 with new patchy hemorrhage along periphery of infarcted right frontal parietal lobe, decreased dominant anterior margin hemorrhage.  Stable herniation of infarcted brain through craniectomy site. CT head done 08/14/2019 with worsening with increased hemorrhagic transformation on the right and new IVH. CT head 08/15/2019 slightly worsened hemorrhagic transformation from previous. CT head done 08/16/2019 with hemorrhagic right ACA and MCA territory infarct with edematous brain bulging through Crani defect. CT head 08/18/2019 with stable hematoma, increased right hemisphere mass-effect, external herniation from Crani site, with market right lateral ventriculomegaly than prior images. 2D echo done with a EF of 60 to 65% of emboli. 2D echo with bubble study ordered was negative for PFO. Lower extremity Dopplers done showed bilateral DVT. Fasting lipid panel done with LDL of 83.  Patient underwent hypercoagulable autoimmune work-up which was negative.  Patient placed on heparin 5000 units 3 times daily for DVT prophylaxis which was subsequently discontinued as patient underwent hemorrhagic transformation.  Patient was placed back on IV heparin due to extensive DVT/PE which was subsequently discontinued due to worsening hemorrhagic transformation.  Patient seen by CIR and declined on 08/10/2019.  Palliative care following.  Neurology following.  PT/OT/ST.  2.  Right ICA occlusion Per MRA.  Per neurology patient's wife denies any recent head trauma and stated patient had recent aggressive exercise with weight lifting.  Per neurology dissection likely etiology of patient's stroke.  Per  neurology.  3.  Cytotoxic cerebral edema Status post right decompressive hemicraniectomy per Dr. Kathyrn Sheriff with right flap 07/19/2019.  Patient was placed on hypertonic saline per neurology which was discontinued 07/24/2019.  Patient had repeat head CT done 07/30/2019 with new parenchymal hemorrhage with ineffective tissue.  Helmet off for further pressure release.  Repeat CT done 08/01/2019 with no significant changes.  Repeat head CT done 08/11/2019 with new patchy hemorrhage along periphery of infarcted right frontoparietal lobe likely hemorrhagic transformation.  Repeat head CT done 920 with worsening right hemorrhagic transformation and new ICH and IVH.  CT head done 08/15/2019 with slightly worsened hemorrhagic transformation from before.  Repeat head CT done 08/16/2019 with hemorrhagic right ACA and MCA territory infarcts with edematous brain bulging through craniectomy defect with no cystic change x2 days.  Repeat head CT done 924 with stable hematoma, increased right hemisphere mass-effect, external herniation from Crani site making right lateral ventriculomegaly than prior images.  Neurology discussed with neurosurgery and was felt no further intervention needed at this time.  4.  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 Lucia Gaskins, 08/21/2019 and sent for cultures.  Patient noted not to be a surgical candidate due to increasing herniation.  ID consulted and patient started on cefepime and vancomycin on 08/20/2019 with duration of therapy 6 to 8 weeks per ID and patient likely needs repeat imaging of the head prior to discontinuation of antibiotics.  ID following.  Will need outpatient follow-up with ID.  5.  Bilateral lower extremity DVT/small left lower lobe PE Noted on lower extremity Dopplers.  Unclear etiology.  Patient initially started  on IV heparin on 07/26/2019.  IVC filter was placed 07/21/2019 with plan retrieval 8 to 12 weeks per IR.  Hypercoagulable work-up which  was done was negative.  Oncology consulted.  Patient underwent repeat lower extremity Dopplers 08/02/2019 that showed extensive DVT in the right peroneal, left popliteal, left posterior tibial and left peroneal up to the IVC filter.  Chest CT which was done showed nonocclusive thrombus within the left posterior lower lobe segmental artery.  Patient treated with IV heparin which was subsequently discontinued secondary to hemorrhagic transformation/intracranial hemorrhage.  Will need outpatient follow-up with hematology.   6.  Abdominal wall hematoma Noted on CT abdomen and pelvis.  Dr. Kathyrn Sheriff did fluid aspiration and seem to be old blood component.  Cultures with no growth to date.  Repeat abdominal ultrasound shows a complex seroma versus liquefied hematoma.  7.  Seizure-like activity Patient underwent EEG that showed continuous slowing, excessive beta activity likely secondary to acute CVA.  Patient underwent long-term EEG that showed cortical dysfunction in the right frontal region.  No noted seizures overnight.  Continue seizure precautions.  Continue current regimen of Keppra.  Per neurology.   8.  Pseudomonas UTI  Status post full course of IV antibiotics with Zosyn.  9.  Hyperlipidemia LDL noted to be at 83.  Goal LDL less than 70.  Statin discontinued secondary to transaminitis.  Consider resumption of statin on discharge if LFTs have trended down.  10.  Dysphagia Secondary to acute CVA.  Patient has been assessed by speech therapy and patient on a dysphagia 1 diet.  Patient currently receiving tube feeds which we will continue for now.    11.  Hypokalemia Potassium at 3.6.  Repeat labs in the morning.     DVT prophylaxis: SCDs Code Status: Full Family Communication: No family at bedside. Disposition Plan: SNF   Consultants:   Palliative care: Dr. Rowe Pavy 08/23/2019  ID: Dr. Tommy Medal 08/20/2019  ID: Dr. Prince Rome 08/05/2019  Oncology: Dr. Irene Limbo 08/03/2019  [facility]: Dr.  Lorin Mercy 08/02/2019  Rehab medicine Dr. Posey Pronto 07/27/2019  Neurosurgery: Dr. Kathyrn Sheriff 07/19/2019  PCCM: Dr Nelda Marseille 07/19/2019  Procedures:   - CT head 08/20/2019, 08/18/2019, 08/16/2019, 08/14/2019, 08/11/2019, 08/01/2019, 07/30/2019, 07/23/2019 CT angios head and neck 07/21/2019 CT angiogram chest 08/01/2019 CT abdomen and pelvis 08/04/2019 Abdominal ultrasound 08/11/2019 2D echo 07/20/2019,  2D echo bubble study 08/03/2019 Lower extremity Dopplers 07/21/2019, 08/02/2019  MRI head 08/20/2019, 07/19/2019 Modified barium swallow 08/25/2019 MRA head and neck 07/19/2019  Antimicrobials:   IV vancomycin 08/20/2019  IV cefepime 08/20/2019  IV Zosyn 08/15/2019>>>> 08/20/2019  IV Flagyl 08/22/2019 x 1   Subjective: Patient sleeping however arousable.  Patient with right-sided gaze.  Nonverbal.  Follows some commands.    Objective: Vitals:   08/27/19 0005 08/27/19 0415 08/27/19 0700 08/27/19 0841  BP: (!) 140/99 (!) 136/95 (!) 117/96 124/88  Pulse:  96 91 85  Resp: 16 14 17 19   Temp: 98.6 F (37 C) 98.8 F (37.1 C) 97.6 F (36.4 C)   TempSrc: Oral Oral Oral   SpO2:   100% 100%  Weight:      Height:        Intake/Output Summary (Last 24 hours) at 08/27/2019 0953 Last data filed at 08/27/2019 0553 Gross per 24 hour  Intake 3063.42 ml  Output 1850 ml  Net 1213.42 ml   Filed Weights   08/24/19 0500 08/25/19 0500 08/26/19 0500  Weight: 92.4 kg 92.3 kg 91.9 kg    Examination:  General exam:  NAD.  Right hemicraniectomy surgical incision on scalp with bulging on the right. Respiratory system: Lungs clear to auscultation bilaterally anterior lung fields.  No wheezes, no crackles, no rhonchi.  Cardiovascular system: Regular rate rhythm no murmurs rubs or gallops.  No JVD.  No lower extremity edema.  Gastrointestinal system: Abdomen is soft, nontender, nondistended, positive bowel sounds.  No rebound.  No guarding.  Right lower abdomen with a firmness noted likely secondary to hematoma. Central nervous  system: Sleeping however opens eyes to verbal stimuli and following some commands.  Nonverbal.  Moves right upper extremity and right lower extremity to commands.  Right gaze position.  Left hemiparesis.  Extremities: Symmetric 5 x 5 power. Skin: No rashes, lesions or ulcers Psychiatry: Judgement and insight unable to assess as patient nonverbal.  Mood & affect appropriate.     Data Reviewed: I have personally reviewed following labs and imaging studies  CBC: Recent Labs  Lab 08/23/19 0800 08/25/19 0524 08/26/19 0515 08/27/19 0430  WBC 8.5 8.3 8.1 7.9  NEUTROABS  --   --  5.4  --   HGB 10.5* 10.6* 10.4* 11.0*  HCT 33.0* 32.8* 32.3* 34.2*  MCV 86.6 85.6 85.9 86.4  PLT 234 278 287 [id number]   Basic Metabolic Panel: Recent Labs  Lab 08/22/19 0442 08/23/19 0800 08/25/19 0524 08/26/19 0515 08/27/19 0430  NA 141 137 135 135 135  K 3.2* 3.8 3.3* 3.5 3.6  CL 111 105 103 103 102  CO2 21* 22 22 22 24   GLUCOSE 126* 125* 122* 107* 117*  BUN 16 16 14 13 13   CREATININE 0.71 0.42* 0.63 0.61 0.65  CALCIUM 7.8* 8.7* 8.6* 8.3* 8.7*   GFR: Estimated Creatinine Clearance: 140.8 mL/min (by C-G formula based on SCr of 0.65 mg/dL). Liver Function Tests: Recent Labs  Lab 08/22/19 0442  AST 36  ALT 53*  ALKPHOS 86  BILITOT 0.6  PROT 6.1*  ALBUMIN 2.5*   No results for input(s): LIPASE, AMYLASE in the last 168 hours. No results for input(s): AMMONIA in the last 168 hours. Coagulation Profile: No results for input(s): INR, PROTIME in the last 168 hours. Cardiac Enzymes: No results for input(s): CKTOTAL, CKMB, CKMBINDEX, TROPONINI in the last 168 hours. BNP (last 3 results) No results for input(s): PROBNP in the last 8760 hours. HbA1C: No results for input(s): HGBA1C in the last 72 hours. CBG: Recent Labs  Lab 08/26/19 1605 08/26/19 2002 08/27/19 0004 08/27/19 0423 08/27/19 0755  GLUCAP 124* 110* 128* 98 98   Lipid Profile: No results for input(s): CHOL, HDL, LDLCALC, TRIG,  CHOLHDL, LDLDIRECT in the last 72 hours. Thyroid Function Tests: No results for input(s): TSH, T4TOTAL, FREET4, T3FREE, THYROIDAB in the last 72 hours. Anemia Panel: No results for input(s): VITAMINB12, FOLATE, FERRITIN, TIBC, IRON, RETICCTPCT in the last 72 hours. Sepsis Labs: No results for input(s): PROCALCITON, LATICACIDVEN in the last 168 hours.  Recent Results (from the past 240 hour(s))  Culture, blood (Routine X 2) w Reflex to ID Panel     Status: None   Collection Time: 08/17/19 10:19 AM   Specimen: BLOOD  Result Value Ref Range Status   Specimen Description BLOOD LEFT ANTECUBITAL  Final   Special Requests   Final    BOTTLES DRAWN AEROBIC ONLY Blood Culture adequate volume   Culture   Final    NO GROWTH 5 DAYS Performed at [facility], [address] [place], [place] [address]    Report Status 08/22/2019 FINAL  Final  Culture, blood (Routine X 2) w Reflex to ID Panel     Status: None   Collection Time: 08/17/19 10:27 AM   Specimen: BLOOD LEFT HAND  Result Value Ref Range Status   Specimen Description BLOOD LEFT HAND  Final   Special Requests   Final    BOTTLES DRAWN AEROBIC ONLY Blood Culture results may not be optimal due to an inadequate volume of blood received in culture bottles   Culture   Final    NO GROWTH 5 DAYS Performed at [facility], [place] [address] [place], [place] [address]    Report Status 08/22/2019 FINAL  Final  Surgical PCR screen     Status: Abnormal   Collection Time: 08/21/19  1:50 AM   Specimen: Nasal Mucosa; Nasal Swab  Result Value Ref Range Status   MRSA, PCR NEGATIVE NEGATIVE Final   Staphylococcus aureus POSITIVE (A) NEGATIVE Final    Comment: CRITICAL RESULT CALLED TO, READ BACK BY AND VERIFIED WITH: RN, TRosanne Sack ET:[id number] @0423  THANEY Performed at [facility] [address] [place], [place] [address]   Body fluid culture     Status: None   Collection Time: 08/21/19  8:35 AM   Specimen: Abscess; Body Fluid   Result Value Ref Range Status   Specimen Description ABSCESS EPIDURAL  Final   Special Requests NONE  Final   Gram Stain   Final    MODERATE WBC PRESENT, PREDOMINANTLY PMN NO ORGANISMS SEEN    Culture   Final    NO GROWTH 3 DAYS Performed at [facility], 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 Past Surgical History: Past Surgical History: Procedure Laterality Date  CRANIOTOMY Right 07/19/2019  Procedure: RIGHT HEMI-CRANIECTOMY With implantation of skull flap to abdominal wall;  Surgeon: Consuella Lose, MD;  Location: [place];  Service: Neurosurgery;  Laterality: Right;  CYST EXCISION N/A 10/08/2016  Procedure: EXCISION OF POSTERIOR NECK CYST;  Surgeon: Clovis Riley, MD;  Location: [facility];  Service: General;  Laterality: N/A;  INCISION AND DRAINAGE ABSCESS N/A 09/22/2014  Procedure: INCISION AND DRAINAGE ABSCESS POSTERIOR NECK;  Surgeon: Pedro Earls, MD;  Location: [facility];  Service: General;  Laterality: N/A;  INCISION AND DRAINAGE ABSCESS N/A 12/20/2015  Procedure: INCISION AND DRAINAGE POSTERIOR NECK MASS;  Surgeon: Armandina Gemma, MD;  Location: [facility];  Service: General;  Laterality: N/A;  INCISION AND DRAINAGE ABSCESS Left 07/10/2004  middle finger  IR IVC FILTER PLMT / S&I /IMG GUID/MOD SED  07/21/2019  IR VENOGRAM RENAL UNI RIGHT  07/21/2019  MASS EXCISION N/A 07/21/2017  Procedure: EXCISION OF BENIGN NECK LESION WITH LAYERED CLOSURE;  Surgeon:  Irene Limbo, MD;  Location: [place];  Service: Plastics;  Laterality: N/A;  MASS EXCISION N/A 11/10/2017  Procedure: EXCISION BENIGN LESION OF THE NECK WITH LAYERED CLOSURE;  Surgeon: Irene Limbo, MD;  Location: [place];  Service: Plastics;  Laterality: N/A; HPI: Pt is a 43 y.o.  with no known PMH who presents on 8/25 after being found down at work, nonverbal with L hemiparesis. 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 Stable trace midline shift. S/p hemicraniectomy with abdominal flap implant. Intubated 8/26-8/31. CXR worsened atelectasis in the right lower lobe. MBS completed 08/09/2019 with recommendation for Dys1/thin liquids. Pt demonstrated increased lethargy and vomiting. Hemorrhagic conversion on CT, transferred back to ICU. Head CT 08/14/19 with increase in hemorrhage, edema, mass effect and compression of ventricle  Subjective: Pt seen in radiology for MBS Assessment / Plan / Recommendation CHL IP CLINICAL IMPRESSIONS 08/25/2019 Clinical Impression Pt presented alert with R lean with neck flexion. He exhibited oropharyngeal dysphagia characterized by reduced labial seal resulting in anterior spillage, penetration and silent aspiration with thins, and generalized weakness when masticating solids. Trials of nectar thick via spoon and straw were WNL including consecutive sips. Puree WNL, timely swallow with no oral or pharyngeal residue. Thin trials via straw resulted in 2 instances of penetration to vocal folds and one instance of silent aspiration. Pt unable to elicit cough on command. Did not attempt compensatory strategies given limited neck ROM. Mechanical soft solids revealed oral weakness with prolonged mastication (1-2 mins for small bite) and moderate lingual residue following initial delayed swallow. Pt stimuable for second swallow with dry spoon and cleared remaining residue. Esophageal backflow into cervical  esophagus following final trial of nectar via straw. Esophageal scan revealed possible reduced peristalsis. Recommend Dys 1 with Nectar thick liquids, meds crushed in puree. Full staff supervision with assist for self-feeding necessary to ensure pt is taking appropriately sized sips/bites with clearance of pocketing. SLP will continue to follow for diet toleration. SLP Visit Diagnosis Dysphagia, oropharyngeal phase (R13.12) Attention and concentration deficit following -- Frontal lobe and executive function deficit following -- Impact on safety and function Moderate aspiration risk   CHL IP TREATMENT RECOMMENDATION 08/25/2019 Treatment Recommendations Therapy as outlined in treatment plan below   Prognosis 08/25/2019 Prognosis for Safe Diet Advancement Good Barriers to Reach Goals Severity of deficits Barriers/Prognosis Comment -- CHL IP DIET RECOMMENDATION 08/25/2019 SLP Diet Recommendations Dysphagia 1 (Puree) solids;Nectar thick liquid Liquid Administration via Straw;Cup Medication Administration Crushed with puree Compensations Minimize environmental distractions;Slow rate;Small sips/bites;Lingual sweep for clearance of pocketing Postural Changes Seated upright at 90 degrees   CHL IP OTHER RECOMMENDATIONS 08/25/2019 Recommended Consults -- Oral Care Recommendations Oral care BID;Staff/trained caregiver to provide oral care Other Recommendations Order thickener from pharmacy   CHL IP FOLLOW UP RECOMMENDATIONS 08/25/2019 Follow up Recommendations Skilled Nursing facility   [facility] IP FREQUENCY AND DURATION 08/25/2019 Speech Therapy Frequency (ACUTE ONLY) min 2x/week Treatment Duration 2 weeks      CHL IP ORAL PHASE 08/25/2019 Oral Phase Impaired Oral - Pudding Teaspoon -- Oral - Pudding Cup -- Oral - Honey Teaspoon -- Oral - Honey Cup -- Oral - Nectar Teaspoon Other (Comment) Oral - Nectar Cup -- Oral - Nectar Straw Other (Comment) Oral - Thin Teaspoon -- Oral - Thin Cup -- Oral - Thin Straw WFL Oral - Puree Other (Comment)  Oral - Mech Soft Weak lingual manipulation;Lingual/palatal residue;Delayed oral transit Oral - Regular -- Oral - Multi-Consistency -- Oral - Pill NT Oral Phase - Comment --  CHL IP PHARYNGEAL PHASE 08/25/2019 Pharyngeal Phase Impaired Pharyngeal- Pudding Teaspoon -- Pharyngeal -- Pharyngeal- Pudding Cup -- Pharyngeal -- Pharyngeal- Honey Teaspoon -- Pharyngeal -- Pharyngeal- Honey Cup -- Pharyngeal -- Pharyngeal- Nectar Teaspoon Delayed swallow initiation-vallecula Pharyngeal -- Pharyngeal- Nectar Cup --  Pharyngeal -- Pharyngeal- Nectar Straw Delayed swallow initiation-vallecula Pharyngeal -- Pharyngeal- Thin Teaspoon -- Pharyngeal -- Pharyngeal- Thin Cup Penetration/Aspiration during swallow Pharyngeal Material enters airway, passes BELOW cords without attempt by patient to eject out (silent aspiration) Pharyngeal- Thin Straw Penetration/Aspiration during swallow Pharyngeal Material enters airway, CONTACTS cords and then ejected out;Material enters airway, passes BELOW cords without attempt by patient to eject out (silent aspiration) Pharyngeal- Puree WFL Pharyngeal -- Pharyngeal- Mechanical Soft Delayed swallow initiation-vallecula Pharyngeal -- Pharyngeal- Regular -- Pharyngeal -- Pharyngeal- Multi-consistency -- Pharyngeal -- Pharyngeal- Pill NT Pharyngeal -- Pharyngeal Comment --  CHL IP CERVICAL ESOPHAGEAL PHASE 08/25/2019 Cervical Esophageal Phase Impaired Pudding Teaspoon -- Pudding Cup -- Honey Teaspoon -- Honey Cup -- Nectar Teaspoon -- Nectar Cup -- Nectar Straw Esophageal backflow into cervical esophagus Thin Teaspoon -- Thin Cup -- Thin Straw -- Puree -- Mechanical Soft -- Regular -- Multi-consistency -- Pill -- Cervical Esophageal Comment -- Houston Siren 08/25/2019, 4:23 PM                   Scheduled Meds:  acetaminophen  650 mg Oral Q4H   Or   acetaminophen (TYLENOL) oral liquid 160 mg/5 mL  650 mg Per Tube Q4H   Or   acetaminophen  650 mg Rectal Q4H   chlorhexidine  15 mL Mouth  Rinse BID   Chlorhexidine Gluconate Cloth  6 each Topical Daily   famotidine  20 mg Per Tube BID   feeding supplement (PRO-STAT SUGAR FREE 64)  30 mL Per Tube TID   heparin injection (subcutaneous)  5,000 Units Subcutaneous Q8H   levETIRAcetam  500 mg Per Tube BID   mouth rinse  15 mL Mouth Rinse q12n4p   metoprolol tartrate  50 mg Per Tube Q8H   sodium chloride flush  10-40 mL Intracatheter Q12H   Continuous Infusions:  sodium chloride 40 mL/hr at 08/27/19 0041   sodium chloride Stopped (08/21/19 2124)   sodium chloride Stopped (08/23/19 0945)   ceFEPime (MAXIPIME) IV 2 g (08/27/19 0945)   feeding supplement (OSMOLITE 1.5 CAL) 1,000 mL (08/27/19 0227)   vancomycin 1,250 mg (08/27/19 0519)     LOS: 39 days    Time spent: 40 minutes    Irine Seal, MD [facility]  If 7PM-7AM, please contact night-coverage [email or web address] 08/27/2019, 9:53 AM

## 2019-08-27 NOTE — Progress Notes (Signed)
STROKE TEAM PROGRESS NOTE   INTERVAL HISTORY Patient stable and awake alert. He remains afebrile. Brain aspiration culture negative so far. 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 Now on Abx.    Vitals:   08/26/19 1800 08/26/19 1959 08/27/19 0005 08/27/19 0415  BP: (!) 143/95  (!) 140/99 (!) 136/95  Pulse: 99   96  Resp: 19 (!) '21 16 14  ' Temp:  98.5 F (36.9 C) 98.6 F (37 C) 98.8 F (37.1 C)  TempSrc:  Axillary Oral Oral  SpO2: 99%     Weight:      Height:        CBC:  Recent Labs  Lab 08/26/19 0515 08/27/19 0430  WBC 8.1 7.9  NEUTROABS 5.4  --   HGB 10.4* 11.0*  HCT 32.3* 34.2*  MCV 85.9 86.4  PLT 287 683    Basic Metabolic Panel:  Recent Labs  Lab 08/26/19 0515 08/27/19 0430  NA 135 135  K 3.5 3.6  CL 103 102  CO2 22 24  GLUCOSE 107* 117*  BUN 13 13  CREATININE 0.61 0.65  CALCIUM 8.3* 8.7*    IMAGING past 24h No results found.   PHYSICAL EXAM       General- Well nourished, well developed, middle-aged African-American male. He has right hemicraniectomy surgical incision on the scalp, bulging.  Right head bulging from crani site, much improved.   Ophthalmologic- fundi not visualized due to noncooperation.  Cardiovascular - Regular rhythm and rate.  Neuro - patient is awake, eyes open.  Nonverbal,  following commands on the right side and moves right upper and lower extremity purposefully to command. However, did not follow central commands. Eyes in right gaze position, does not cross midline, not blinking to visual threat to the left, PERRL. Left facial droop. Tongue protrusion not corporative. Has spontaneous movement of RUE against gravity and RLE in bed 3/5 and moving spontaneously. On pain stimulation, mild withdraw of LLE, but no movement of LUE. DTR 1+ and toes equiv. Sensation, coordination and gait not tested.   ASSESSMENT/PLAN [name] is a 43 y.o. male with no significant  past medical history found down x 2 days nonverbal with L hemiparesis.   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 Trace L midline shift. ELVO at R ICA, R A1, R MCA.  MRI  Large R brain infarct sparing R PCA. Cytotoxic edema but no hemorrhage. Stable trace midline shift.  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 R ICA terminus, R ACA, R MCA occluded.   CT head similar w/ low density and sweddling R ACA and MCA territories, now with 5-20m midline shift. no hemorrhage  CT head acute R ACA and MCA infarct w/ progressive swelling decompressed through craniectomy defect.   CTA head and neck stable MLS. R ICA occlusion in neck that continues into R ACA and MCA. Mild petechial hemorrhage at basal ganglia  CT Head 8/29 - Unchanged appearance of massive right MCA and ACA territory infarcts with parenchyma extending through decompressive craniectomy. Small focus of suspected hemorrhage adjacent to the right caudate head.  CT head 07/30/19 - New  hematoma within the infarcted tissue   CT head 08/01/2019 stable w/o change  CT head 08/11/19 - new patchy hemorrhage along periphery of infarcted R frontoparietal lobe (likely HT) decreased dominant anterior margin hemorrhage. Stable herniation of infarcted brain through crani site.   CT head 9/20 - worsening with increased HT on the right and new IVH  CT head 08/15/19 - slightly worsened HT from previous.   CT head 9/22 hemorrhagic R ACA and MCA territory infarcts with edematous brain bulging through Crani defect.  No change x2 days  CT head 9/24 stable hematoma, increased right hemisphere mass-effect, external herniation from Crani site, making right  lateral ventriculomegaly than prior images.  CT head 9/26   MRI brain 9/26 R brain infarct. Decompressive crani w/ herniation of brain into site. Overlying subgaleal fluid collection on the R. Large irreg fluid collection antioerio marigin of crani->abscess. Large hemorrhage into R temporoparietal infarct. Mild ventricular enlargement.  2D Echo EF 60-65%  2D echo w/ bubble ordered no LRS   LE venous doppler DVT in R peroneal, L popliteal, L posterior tibial and L peroneal  LE venous doppler repeat DVT in R peroneal, L popliteal, L posterior tibial and L peroneal  TCD bubble no HITS, no PFO  LDL 83  HgbA1c 5.4  UDS positive THC  Hypercoagulable and autoimmune work up negative  Add Heparin 5000 units sq tid for VTE prophylaxis  No antithrombotic prior to admission, treated DVT with heparin IV, then  discontinued due to hemorrhagic conversion with right frontal HT, then resumed heparin IV given extensive DVT and PE, heparin IV again discontinued due to worsening HT  Therapy recommendations:  SNF (reassessed by CIR and declined again 9/16)  Disposition:  Pending  Palliative Care consulted met with wife 9/29 - wife wants full aggressive care   Right ICA occlusion  MRA and CTA showed right ICA occlusion from origin to terminal  Wife denies any head trauma  Wife stated that pt had recent aggressive exercise with weight lifting  Concerning dissection as working diagnosis of stroke etiology   Cyctotoxic cerebral edema  S/p R decompressive hemicraniectomy (Nundkumar) w/ flap R abd 07/19/2019  PICC placed - keep for now per Leonie Man  Given One dose of 23.4% 8/26; 3%  off 8/30 1700  CT repeat 07/30/19 - New parenchymal hemorrhage within the infarcted tissue  Off helmet to further release pressure  CT repeat 08/01/2019 stable w/o change  CT head repeat 08/11/19 - new patchy hemorrhage along periphery of infarcted R frontoparietal lobe (likely HT) but previous right frontal  hematoma now much reduced in size  CT head 9/20 worsening right HT with new ICH and IVH  CT Head 9/21 - slightly worsened HT from previous.   CT head 9/22 hemorrhagic R ACA and MCA territory infarcts with edematous brain bulging through Crani defect.  No change x2 days  CT head 9/24 stable hematoma, increased right hemisphere mass-effect, external herniation from Crani site, making right lateral ventriculomegaly than prior images  Liquefactive necrosis with absorption fever - less likely cerebral abscess or empyema  CT 08/20/19 - Irregular collections about the craniectomy site with question of disruption of the underlying dura, which could reflect sequelae of underlying CSF leak. Correlation with physical exam and fluid sampling suggested for further evaluation.  MRI W&WO - 08/20/19 - external herniation of brain into the craniectomy site. Large irregularly enhancing fluid collection along the anterior margin of the craniectomy measuring approximately 5.4 x 9.8 cm. This may represent a  large abscess, sterile hematoma versus infected hematoma.   Dr Kathyrn Sheriff aspirated 9/27 and sent for cx. Not a surgical candidate d/t ris increasing herniation.  Cx moderate WBC, no organisms - no growth 3 days  Tmax - 100.5->afebrile  WBC - 8.1->7.9  ID on board   Maxipime, Vancomycin 9/26>> (plan tx x 8 weeks)  Repeat MRI prior to stopping therapy after 8 weeks of vancomycin/cefepime  Notify ID once pt close to d/c  Blood cultures - neg, repeated again 08/04/19 -> no growth on empiric unasyn -> changed to vanco, cefepime and flagyl 08/05/19->now off  Lactic acid 1.2->2.0  Hepatitis panel 9/12 - neg  procalcitonin and lactic acid normal   B LE DVT Small LLL PE  LE venous doppler DVT in R peroneal, L popliteal, L posterior tibial and L peroneal  Etiology unclear  Started IV heparin 07/26/2019  IVC filter placed 8/27. Plan retrieval in 8-12 weeks as an IP in an IR clinic  Hypercoagulable  work up negative   LE venous doppler 08/02/19 repeat extensive DVT in R peroneal, L popliteal, L posterior tibial and L peroneal up to the IVC filter  CT chest Nonocclusive thrombus seen within the left posterior lower lobe segmental artery.   Treated with IV Heparin but then off secondary to intracranial hemorrhage, then restarted IV heparin given extensive DVT and PE but again stopped due to worsening HT on 9/20  Hematology consult, appreciate help - hypercoag w/u neg.  At risk for recurrent clots   Factor V Leiden, homocysteine, Prot C&S, Prothombin gene mutation all neg.   Abdominal wall hematoma  Large fluid collection seen on CT abdomen  CT abd/pel concerning for abdominal wall fluid collection likely old hematoma  Dr. Kathyrn Sheriff did fluid aspiration and seems to be old blood component  Fluid aspiration sent for culture 9/11 - no growth < 24 hrs  Repeat ABD [place] complex seroma vs liquified hematoma R abd similar to previous  Seizure-like activity  EEG continuous slowing, excessive beta activity, sleep spindle asymmetry decreased R->related to stroke and sedation. No SZ  Long Term EEG cortical dysfunction in right frontal region  Seizure precautions  Continue Keppra   UTI, resolved  Urine culture pseudomonas - treated with  to zosyn  UA 9/21 - WBC 0-5 negative   UA 9/23 - WBC 11-20  Repeat B Cx no growth  Tachycardia, resolved  HR normal now  Likely due to fever  Hydration with IVF, TF and free water  On metoprolol to 50 Q8h  EKG ST, rate 120 w/ possible LA enlargement  Troponin series neg x 3    Hyperlipidemia  Home meds:  no statin  LDL 83, goal < 70  Was on lipitor 20 mg daily   D/c statin due to elevated LFT  Consider statin at d/c if LFTs down  Elevated LFT, almost resolved  AST - 105-118-98-85-38-36  ALT - 80-68-65-164-64-53  CK 2063-852 [id number]  Hepatitis panel 9/12 - neg  Dysphagia . Secondary to stroke  IVF @ 40cc  total  Cleared for Dysphagia 1 nectar thick liquids 10/1  Still on TF @ 60  Speech on board  Other Stroke Risk Factors  Former Cigarette smoker, quit 3 yrs ago  ETOH use  Substance abuse UDS - positive for THC   Obesity, Body mass index is 28.26 kg/m., slightly overweight, recommend weight loss, diet and exercise as appropriate   Other Active Problems  Hypokalemia 3.2 - replaced   Normocytic anemia - hematology felt  d/t ongoing blood draws Hgb 10.4->11.0  Hospital day # 36  Rosalin Hawking, MD PhD Stroke Neurology 08/27/2019 10:46 AM       To contact Stroke Continuity provider, please refer to [email or web address] After hours, contact General Neurology

## 2019-08-27 NOTE — TOC Initial Note (Signed)
Transition of Care [facility]) - Initial/Assessment Note    Patient Details  Name: Matthew Black MRN: [id number] Date of Birth: 06/30/1976  Transition of Care [facility]) CM/SW Contact:    Oretha Milch, LCSW Phone Number: 08/27/2019, 11:00 AM  Clinical Narrative:                 CSW received consult for skilled nursing facility. CSW spoke with patient's spouse and assessed for skilled nursing facility  concerns. CSW notes patient's spouse reports that they wanted to review ratings for facilities for SNF placement as they have heard a lot of bad things about these facilities. [place] was guarded to discussion of skilled nursing facility. CSW discussed community resources and options for support. CSW noted patient's spouse wanted to review and discussed other option's if SNF facilities were not the care the family and patient wanted.    CSW spoke with family regarding patient's skilled nursing facility concerns/questions. CSW gathered relevant collateral on patient's social environment and current lifestyle. CSW notes per family that patient would likely need DME equipment too when discharged to SNF and back to home. CSW discussed the process and answered any questions family and spouse had. CSW noted these factors regarding patient's current living situation and health. CSW will provide support to patient with these concerns by coordinating with family for placement discussions/options.    SNF: CSW informed patient and family of SNFs in the [place] are that patient may qualify for with their medical needs and insurance coverage. CSW informed patient of the following possible barriers to placement: insurance authorization. CSW informed patient that as patient is under inpatient hospitalization status a list of SNF options have been provided. CSW will refer patient out to providers and will follow-up with patient regarding bed offers for SNF choice.. CSW inquired if patient or family had further questions at  this time and noted no current questions or concerns.     Expected Discharge Plan: Skilled Nursing Facility Barriers to Discharge: Insurance Authorization   Patient Goals and CMS Choice Patient states their goals for this hospitalization and ongoing recovery are:: Patient is nonverbal. Per family they want patient to work their way back to returning to home when medically stable. CMS Medicare.gov Compare Post Acute Care list provided to:: Patient Represenative (must comment)(Patient's spouse) Choice offered to / list presented to : Spouse  Expected Discharge Plan and Services Expected Discharge Plan: [place] In-house Referral: Financial Counselor Discharge Planning Services: CM Consult Post Acute Care Choice: [place] Living arrangements for the past 2 months: Single Family Home                 DME Arranged: N/A DME Agency: NA       HH Arranged: NA HH Agency: NA        Prior Living Arrangements/Services Living arrangements for the past 2 months: Single Family Home Lives with:: Spouse, Minor Children Patient language and need for interpreter reviewed:: Yes        Need for Family Participation in Patient Care: Yes (Comment)(Patient nonverbal) Care giver support system in place?: No (comment)   Criminal Activity/Legal Involvement Pertinent to Current Situation/Hospitalization: No - Comment as needed  Activities of Daily Living Home Assistive Devices/Equipment: None ADL Screening (condition at time of admission) Patient's cognitive ability adequate to safely complete daily activities?: No Is the patient deaf or have difficulty hearing?: No Does the patient have difficulty seeing, even when wearing glasses/contacts?: No Does the patient have difficulty concentrating,  remembering, or making decisions?: Yes Patient able to express need for assistance with ADLs?: No Does the patient have difficulty dressing or bathing?: Yes Independently  performs ADLs?: No Communication: Needs assistance Is this a change from baseline?: Change from baseline, expected to last >3 days Dressing (OT): Needs assistance Is this a change from baseline?: Change from baseline, expected to last >3 days Grooming: Needs assistance Is this a change from baseline?: Change from baseline, expected to last >3 days Feeding: Needs assistance Is this a change from baseline?: Change from baseline, expected to last >3 days Bathing: Needs assistance Is this a change from baseline?: Change from baseline, expected to last >3 days Toileting: Needs assistance Is this a change from baseline?: Change from baseline, expected to last >3days In/Out Bed: Needs assistance Is this a change from baseline?: Change from baseline, expected to last >3 days Walks in Home: Needs assistance Is this a change from baseline?: Change from baseline, expected to last >3 days Does the patient have difficulty walking or climbing stairs?: Yes Weakness of Legs: Both Weakness of Arms/Hands: Both  Permission Sought/Granted Permission sought to share information with : Facility Art therapist granted to share information with : No     Permission granted to share info w AGENCY: Spouse is requesting to review facility list prior to referral        Emotional Assessment Appearance:: Disheveled, Appears older than stated age Attitude/Demeanor/Rapport: Unable to Assess Affect (typically observed): Unable to Assess   Alcohol / Substance Use: Not Applicable Psych Involvement: No (comment)  Admission diagnosis:  Stroke (cerebrum) (HCC) [I63.9] Acute CVA (cerebrovascular accident) [facility]) [I63.9] Patient Active Problem List   Diagnosis Date Noted  . Cerebral abscess   . Urinary tract infection without hematuria   . Altered mental status   . Primary hypercoagulable state ([place])   . Acute pulmonary embolism without acute cor pulmonale (HCC)   . Deep vein thrombosis (DVT)  of non-extremity vein   . Hypokalemia   . FUO (fever of unknown origin)   . Acute blood loss anemia   . SIRS (systemic inflammatory response syndrome) (HCC)   . Leukocytosis   . Endotracheal tube present   . Acute respiratory failure with hypoxemia ([place])   . Stroke (cerebrum) ([place]) 07/19/2019  . Pressure injury of skin 07/19/2019  . Acute CVA (cerebrovascular accident) ([place])   . Encephalopathy   . Dysphagia   . Acute encephalopathy   . Essential hypertension   . Annual physical exam 09/11/2016  . Obesity 03/12/2016  . PCP NOTES >>>>>>>>>>>>>>>>>. 03/12/2016  . Scalp abscess 12/20/2015  . Neck abscess 12/20/2015  . Pilonidal cyst 02/08/2013   PCP:  Colon Branch, MD Pharmacy:   [id number] [place], [place] - [address] [place] [address] Phone: [phone] Fax: [phone]  [id number] [place], [place] [id number] [place] [id number] Phone: [phone] Fax: [phone]     Social Determinants of Health (SDOH) Interventions    Readmission Risk Interventions No flowsheet data found.

## 2019-08-27 NOTE — NC FL2 (Signed)
[place] LEVEL OF CARE SCREENING TOOL     IDENTIFICATION  Patient Name: Matthew Black Birthdate: [birth year] Sex: male Admission Date (Current Location): 07/19/2019  [place] and [place] Number:  Kathleen Argue [id number] [place] and Address:  [facility] [place], [place] [address] [place], [place] [address]      Provider Number: [id number]  Attending Physician Name and Address:  Eugenie Filler, MD  Relative Name and Phone Number:  Verita Lamb, wife, [phone]    Current Level of Care: Hospital Recommended Level of Care: [place] Prior Approval Number:    Date Approved/Denied:   PASRR Number: [id number]  Discharge Plan: SNF    Current Diagnoses: Patient Active Problem List   Diagnosis Date Noted  . Cerebral abscess   . Urinary tract infection without hematuria   . Altered mental status   . Primary hypercoagulable state ([place])   . Acute pulmonary embolism without acute cor pulmonale (HCC)   . Deep vein thrombosis (DVT) of non-extremity vein   . Hypokalemia   . FUO (fever of unknown origin)   . Acute blood loss anemia   . SIRS (systemic inflammatory response syndrome) (HCC)   . Leukocytosis   . Endotracheal tube present   . Acute respiratory failure with hypoxemia ([place])   . Stroke (cerebrum) ([place]) 07/19/2019  . Pressure injury of skin 07/19/2019  . Acute CVA (cerebrovascular accident) ([address])   . Encephalopathy   . Dysphagia   . Acute encephalopathy   . Essential hypertension   . Annual physical exam 09/11/2016  . Obesity 03/12/2016  . PCP NOTES >>>>>>>>>>>>>>>>>. 03/12/2016  . Scalp abscess 12/20/2015  . Neck abscess 12/20/2015  . Pilonidal cyst 02/08/2013    Orientation RESPIRATION BLADDER Height & Weight     Self  Normal Continent, External catheter Weight: 202 lb 9.6 oz (91.9 kg) Height:  5\' 11"  (180.3 cm)  BEHAVIORAL SYMPTOMS/MOOD NEUROLOGICAL BOWEL NUTRITION STATUS      Continent (see discharge summary)   AMBULATORY STATUS COMMUNICATION OF NEEDS Skin   Extensive Assist Non-Verbally Surgical wounds(incision on head with no dressing)                       Personal Care Assistance Level of Assistance  Bathing, Feeding, Dressing Bathing Assistance: Maximum assistance Feeding assistance: Maximum assistance Dressing Assistance: Maximum assistance     Functional Limitations Info  Sight, Hearing, Speech Sight Info: Adequate Hearing Info: Adequate Speech Info: Impaired    SPECIAL CARE FACTORS FREQUENCY  PT (By licensed PT), OT (By licensed OT)     PT Frequency: 5x week OT Frequency: 5x week            Contractures Contractures Info: Not present    Additional Factors Info  Code Status, Allergies Code Status Info: Full Code Allergies Info: Hydrocodone           Current Medications (08/27/2019):  This is the current hospital active medication list Current Facility-Administered Medications  Medication Dose Route Frequency Provider Last Rate Last Dose  . 0.9 %  sodium chloride infusion   Intravenous Continuous Donzetta Starch, NP 40 mL/hr at 08/27/19 0041    . 0.9 %  sodium chloride infusion   Intravenous PRN Donzetta Starch, NP   Stopped at 08/21/19 2124  . 0.9 %  sodium chloride infusion   Intravenous PRN Donzetta Starch, NP   Stopped at 08/23/19 0945  . acetaminophen (TYLENOL) tablet 650 mg  650 mg  Oral Q4H Donzetta Starch, NP   650 mg at 08/25/19 2057   Or  . acetaminophen (TYLENOL) solution 650 mg  650 mg Per Tube Q4H Donzetta Starch, NP   650 mg at 08/27/19 [id number]   Or  . acetaminophen (TYLENOL) suppository 650 mg  650 mg Rectal Q4H Biby, Sharon L, NP      . ceFEPIme (MAXIPIME) 2 g in sodium chloride 0.9 % 100 mL IVPB  2 g Intravenous Q8H Biby, Sharon L, NP 200 mL/hr at 08/27/19 0945 2 g at 08/27/19 0945  . chlorhexidine (PERIDEX) 0.12 % solution 15 mL  15 mL Mouth Rinse BID Burnetta Sabin L, NP   15 mL at 08/27/19 0832  . Chlorhexidine Gluconate Cloth 2 % PADS 6 each  6 each  Topical Daily Donzetta Starch, NP   6 each at 08/27/19 [phone]  . famotidine (PEPCID) 40 MG/5ML suspension 20 mg  20 mg Per Tube BID Burnetta Sabin L, NP   20 mg at 08/27/19 0849  . feeding supplement (OSMOLITE 1.5 CAL) liquid 1,000 mL  1,000 mL Per Tube Continuous Donzetta Starch, NP 60 mL/hr at 08/27/19 0227 1,000 mL at 08/27/19 0227  . feeding supplement (PRO-STAT SUGAR FREE 64) liquid 30 mL  30 mL Per Tube TID Donzetta Starch, NP   30 mL at 08/27/19 0833  . heparin injection 5,000 Units  5,000 Units Subcutaneous Q8H Donzetta Starch, NP   5,000 Units at 08/27/19 0520  . labetalol (NORMODYNE) injection 5-20 mg  5-20 mg Intravenous Q10 min PRN Donzetta Starch, NP      . levETIRAcetam (KEPPRA) 100 MG/ML solution 500 mg  500 mg Per Tube BID Donzetta Starch, NP   500 mg at 08/27/19 [id number]  . MEDLINE mouth rinse  15 mL Mouth Rinse q12n4p Burnetta Sabin L, NP   15 mL at 08/26/19 1722  . metoprolol tartrate (LOPRESSOR) tablet 50 mg  50 mg Per Tube Q8H Biby, Sharon L, NP   50 mg at 08/27/19 0520  . ondansetron (ZOFRAN) injection 4 mg  4 mg Intravenous Q6H PRN Donzetta Starch, NP      . Resource ThickenUp Clear   Oral PRN Eugenie Filler, MD      . senna-docusate (Senokot-S) tablet 1 tablet  1 tablet Per Tube QHS PRN Donzetta Starch, NP   1 tablet at 08/17/19 1417  . sodium chloride flush (NS) 0.9 % injection 10-40 mL  10-40 mL Intracatheter Q12H Biby, Sharon L, NP   10 mL at 08/26/19 1014  . sodium chloride flush (NS) 0.9 % injection 10-40 mL  10-40 mL Intracatheter PRN Donzetta Starch, NP   10 mL at 08/02/19 2213  . traMADol (ULTRAM) tablet 50 mg  50 mg Per Tube Q6H PRN Donzetta Starch, NP   50 mg at 08/23/19 1727  . vancomycin (VANCOCIN) 1,250 mg in sodium chloride 0.9 % 250 mL IVPB  1,250 mg Intravenous Q8H Biby, Ivin Booty L, NP 166.7 mL/hr at 08/27/19 0519 1,250 mg at 08/27/19 [id number]     Discharge Medications: Please see discharge summary for a list of discharge medications.  Relevant Imaging Results:  Relevant Lab  Results:   Additional Information SS#[id number] [place], [place]

## 2019-08-28 LAB — GLUCOSE, CAPILLARY
Glucose-Capillary: 116 mg/dL — ABNORMAL HIGH (ref 70–99)
Glucose-Capillary: 136 mg/dL — ABNORMAL HIGH (ref 70–99)
Glucose-Capillary: 114 mg/dL — ABNORMAL HIGH (ref 70–99)
Glucose-Capillary: 103 mg/dL — ABNORMAL HIGH (ref 70–99)
Glucose-Capillary: 105 mg/dL — ABNORMAL HIGH (ref 70–99)

## 2019-08-28 NOTE — Progress Notes (Signed)
PROGRESS NOTE    Matthew Black  [id number] DOB: 1976-08-06 DOA: 07/19/2019 PCP: Colon Branch, MD    Brief Narrative:  Patient 43 year old gentleman with no significant past medical history who was found down x2 days nonverbal with left hemiparesis.  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  Patient status post hemicraniectomy with abdominal flap implant.  Patient subsequently underwent a hemorrhagic conversion with hematoma 07/30/2019 which worsened on 9/20 with right infarct area.  Patient was seen and followed by neurology in the ICU.  Patient subsequently underwent right decompressive hemicraniectomy by Dr. Kathyrn Sheriff with flap on 07/19/2019. 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  Patient underwent aspiration on 08/21/2019 per Dr. Kathyrn Sheriff and cultures sent.  Patient noted to be not a surgical candidate due to increasing herniation.  ID was consulted and patient started empirically on IV vancomycin IV cefepime. Patient also noted to have a bilateral lower extremity DVT and small left lower lobe PE per venous Dopplers and CT angiogram.  Heparin was started on 07/26/2019.  IVC filter placed 07/21/2019 with plan retrieval 8 to 12 weeks if patient still hospitalized or in the outpatient IR clinic.  Hypercoagulable work-up done was negative.  Oncology consulted.  Patient was on IV heparin however subsequently discontinued due to intracranial hemorrhage and then subsequently resumed given extensive DVT and PE but then subsequently discontinued due to worsening hemorrhage on 08/14/2019.  Patient also noted to have abdominal wall hematoma.  Patient  also noted to have some seizure-like activity subsequently placed on seizure precautions and on Keppra.  Patient status post treatment of Pseudomonas UTI with Zosyn.  Noted with dysphagia with speech therapy following.  Patient subsequently transferred to tried service on 08/25/2019 with neurology following.   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  Questionable etiology per neurology concerned that patient may have had a possible right ICA dissection from exercise.  Patient noted to have hemorrhagic conversion with hematoma 07/30/2019 with worsening on 08/14/2019 in the right infarct. Patient followed by neurology throughout the hospitalization.  Patient had head CT done that showed a large right brain infarct with edema right ACA and MCA territories.  MRI showed large right brain infarct with cytotoxic edema but no hemorrhage.  MRA head and neck showed right ICA occluded at origin.  Right ICA terminus, R ACA, R MCA occluded.  Head CT which was done showed 5 to 6 mm midline shift with no hemorrhage.  Patient underwent acute head CT that showed  progression with swelling decompressive craniectomy defect.  CTA head and neck was done that showed right ICA occlusion neck that continues into right ACA and MCA. CT head done 07/23/2019 showed unchanged  appearance of massive right MCA and ACA territory infarct with parenchyma extending through decompressive craniectomy.  Small focus of suspected hemorrhage adjacent to right caudate head. CT head done 07/30/2019 with new hematoma within infarcted tissue. CT head done 08/01/2019 with no significant change. CT head done 08/11/2019 with new patchy hemorrhage along periphery of infarcted right frontal parietal lobe, decreased dominant anterior margin hemorrhage.  Stable herniation of infarcted brain through craniectomy site. CT head done 08/14/2019 with worsening with increased hemorrhagic transformation on the right and new IVH. CT head 08/15/2019 slightly worsened hemorrhagic transformation from previous. CT head done 08/16/2019 with hemorrhagic right ACA and MCA territory infarct with edematous brain bulging through Crani defect. CT head 08/18/2019 with stable hematoma, increased right hemisphere mass-effect, external herniation from Crani site, with market right lateral ventriculomegaly than prior images. 2D echo done with a EF of 60 to 65% of emboli. 2D echo with bubble study ordered was negative for PFO. Lower extremity Dopplers done showed bilateral DVT. Fasting lipid panel done with LDL of 83.  Patient underwent hypercoagulable autoimmune work-up which was negative.  Patient placed on heparin 5000 units 3 times daily for DVT prophylaxis which was subsequently discontinued as patient underwent hemorrhagic transformation.  Patient was placed back on IV heparin due to extensive DVT/PE which was subsequently discontinued due to worsening hemorrhagic transformation.  Patient seen by CIR and declined on 08/10/2019.  Palliative care following.  Neurology following.  PT/OT/ST.  2.  Right ICA occlusion Per MRA.  Per neurology patient's wife denies any recent head trauma and stated patient had recent aggressive exercise with weight lifting.  Per neurology dissection likely etiology of patient's stroke.  Per  neurology.  3.  Cytotoxic cerebral edema Status post right decompressive hemicraniectomy per Dr. Kathyrn Sheriff with right flap 07/19/2019.  Patient was placed on hypertonic saline per neurology which was discontinued 07/24/2019.  Patient had repeat head CT done 07/30/2019 with new parenchymal hemorrhage with ineffective tissue.  Helmet off for further pressure release.  Repeat CT done 08/01/2019 with no significant changes.  Repeat head CT done 08/11/2019 with new patchy hemorrhage along periphery of infarcted right frontoparietal lobe likely hemorrhagic transformation.  Repeat head CT done 920 with worsening right hemorrhagic transformation and new ICH and IVH.  CT head done 08/15/2019 with slightly worsened hemorrhagic transformation from before.  Repeat head CT done 08/16/2019 with hemorrhagic right ACA and MCA territory infarcts with edematous brain bulging through craniectomy defect with no cystic change x2 days.  Repeat head CT done 9/24 with stable hematoma, increased right hemisphere mass-effect, external herniation from Crani site making right lateral ventriculomegaly than prior images.  Neurology discussed with neurosurgery and was felt no further intervention needed at this time.  4.  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 Lucia Gaskins, 08/21/2019 and sent for cultures.  Cultures with no growth to date.  Patient noted not to be a surgical candidate due to increasing herniation.  ID consulted and patient started on cefepime and vancomycin on 08/20/2019 with duration of therapy 6 to 8 weeks per ID and patient likely needs repeat imaging of the head prior to discontinuation of antibiotics.  ID following.  Will need outpatient follow-up with ID.  5.  Bilateral lower extremity DVT/small left lower lobe PE Noted on lower extremity Dopplers.  Unclear etiology.  Patient initially started on IV heparin on 07/26/2019.  IVC filter was placed 07/21/2019 with plan retrieval 8 to 12 weeks per  IR.  Hypercoagulable work-up which was done was negative.  Oncology consulted.  Patient underwent repeat lower extremity Dopplers 08/02/2019 that showed extensive DVT in the right peroneal, left popliteal, left posterior tibial and left peroneal up to the IVC filter.  Chest CT which was done showed nonocclusive thrombus within the left posterior lower lobe segmental artery.  Patient treated with IV heparin which was subsequently discontinued secondary to hemorrhagic transformation/intracranial hemorrhage.  Will need outpatient follow-up with hematology.   6.  Abdominal wall hematoma Noted on CT abdomen and pelvis.  Dr. Kathyrn Sheriff did fluid aspiration and seem to be old blood component.  Cultures with no growth to date.  Repeat abdominal ultrasound shows a complex seroma versus liquefied hematoma.  7.  Seizure-like activity Patient underwent EEG that showed continuous slowing, excessive beta activity likely secondary to acute CVA.  Patient underwent long-term EEG that showed cortical dysfunction in the right frontal region.  No noted seizures overnight.  Continue seizure precautions.  Continue current regimen of Keppra.  Per neurology.   8.  Pseudomonas UTI  Patient has received a full course of IV antibiotics with Zosyn.   9.  Hyperlipidemia LDL noted to be at 83.  Goal LDL less than 70.  Statin discontinued secondary to transaminitis.  Consider resumption of statin on discharge if LFTs have trended down.  10.  Dysphagia Secondary to acute CVA.  Patient has been assessed by speech therapy and patient on a dysphagia 1 diet.  Patient currently receiving tube feeds which we will continue for now.    11.  Hypokalemia Potassium at 3.6.  Repeat labs in the morning.     DVT prophylaxis: SCDs Code Status: Full Family Communication: No family at bedside. Disposition Plan: SNF   Consultants:   Palliative care: Dr. Rowe Pavy 08/23/2019  ID: Dr. Tommy Medal 08/20/2019  ID: Dr. Prince Rome 08/05/2019  Oncology:  Dr. Irene Limbo 08/03/2019  [facility]: Dr. Lorin Mercy 08/02/2019  Rehab medicine Dr. Posey Pronto 07/27/2019  Neurosurgery: Dr. Kathyrn Sheriff 07/19/2019  PCCM: Dr Nelda Marseille 07/19/2019  Procedures:   - CT head 08/20/2019, 08/18/2019, 08/16/2019, 08/14/2019, 08/11/2019, 08/01/2019, 07/30/2019, 07/23/2019 CT angios head and neck 07/21/2019 CT angiogram chest 08/01/2019 CT abdomen and pelvis 08/04/2019 Abdominal ultrasound 08/11/2019 2D echo 07/20/2019,  2D echo bubble study 08/03/2019 Lower extremity Dopplers 07/21/2019, 08/02/2019  MRI head 08/20/2019, 07/19/2019 Modified barium swallow 08/25/2019 MRA head and neck 07/19/2019  Antimicrobials:   IV vancomycin 08/20/2019  IV cefepime 08/20/2019  IV Zosyn 08/15/2019>>>> 08/20/2019  IV Flagyl 08/22/2019 x 1   Subjective: Patient alert.  Follows some commands.  Nonverbal.  Right sided gaze.   Objective: Vitals:   08/27/19 2000 08/28/19 0001 08/28/19 0400 08/28/19 0838  BP: (!) 144/96 (!) 143/94 (!) 140/110 (!) 132/91  Pulse: (!) 103 94 (!) 101 89  Resp: (!) 21 19 18 20   Temp: 99.2 F (37.3 C) 98.3 F (36.8 C) 99.6 F (37.6 C) 98 F (36.7 C)  TempSrc: Oral Oral Oral   SpO2: 100% 100% 100%   Weight:      Height:        Intake/Output Summary (Last 24 hours) at 08/28/2019 0936 Last data filed at 08/28/2019 0435 Gross per 24 hour  Intake 2060 ml  Output 3750 ml  Net -1690 ml   Filed Weights   08/24/19 0500 08/25/19 0500 08/26/19 0500  Weight: 92.4  kg 92.3 kg 91.9 kg    Examination:  General exam: NAD.  Right hemicraniectomy surgical incision on scalp with bulging on the right, slight purulent drainage noted on pillowcase. Respiratory system: CTAB anterior lung fields.  No wheezes, no crackles, no rhonchi.  Normal respiratory effort.   Cardiovascular system: RRR no murmurs rubs or gallops.  No JVD.  No lower extremity edema.    Gastrointestinal system: Abdomen is nontender, nondistended, soft, positive bowel sounds.  No rebound.  No guarding.  Right lower abdomen  with a firmness noted likely secondary to hematoma. Central nervous system: Alert.  Following some commands.  Moves right upper extremity and right lower extremity.  Right gaze position.  Left hemiparesis.  Extremities: Symmetric 5 x 5 power. Skin: No rashes, lesions or ulcers Psychiatry: Judgement and insight unable to assess as patient nonverbal.  Mood & affect appropriate.     Data Reviewed: I have personally reviewed following labs and imaging studies  CBC: Recent Labs  Lab 08/23/19 0800 08/25/19 0524 08/26/19 0515 08/27/19 0430  WBC 8.5 8.3 8.1 7.9  NEUTROABS  --   --  5.4  --   HGB 10.5* 10.6* 10.4* 11.0*  HCT 33.0* 32.8* 32.3* 34.2*  MCV 86.6 85.6 85.9 86.4  PLT 234 278 287 [id number]   Basic Metabolic Panel: Recent Labs  Lab 08/22/19 0442 08/23/19 0800 08/25/19 0524 08/26/19 0515 08/27/19 0430  NA 141 137 135 135 135  K 3.2* 3.8 3.3* 3.5 3.6  CL 111 105 103 103 102  CO2 21* 22 22 22 24   GLUCOSE 126* 125* 122* 107* 117*  BUN 16 16 14 13 13   CREATININE 0.71 0.42* 0.63 0.61 0.65  CALCIUM 7.8* 8.7* 8.6* 8.3* 8.7*   GFR: Estimated Creatinine Clearance: 140.8 mL/min (by C-G formula based on SCr of 0.65 mg/dL). Liver Function Tests: Recent Labs  Lab 08/22/19 0442  AST 36  ALT 53*  ALKPHOS 86  BILITOT 0.6  PROT 6.1*  ALBUMIN 2.5*   No results for input(s): LIPASE, AMYLASE in the last 168 hours. No results for input(s): AMMONIA in the last 168 hours. Coagulation Profile: No results for input(s): INR, PROTIME in the last 168 hours. Cardiac Enzymes: No results for input(s): CKTOTAL, CKMB, CKMBINDEX, TROPONINI in the last 168 hours. BNP (last 3 results) No results for input(s): PROBNP in the last 8760 hours. HbA1C: No results for input(s): HGBA1C in the last 72 hours. CBG: Recent Labs  Lab 08/27/19 1526 08/27/19 2010 08/27/19 2348 08/28/19 0442 08/28/19 0836  GLUCAP 106* 96 109* 116* 136*   Lipid Profile: No results for input(s): CHOL, HDL, LDLCALC,  TRIG, CHOLHDL, LDLDIRECT in the last 72 hours. Thyroid Function Tests: No results for input(s): TSH, T4TOTAL, FREET4, T3FREE, THYROIDAB in the last 72 hours. Anemia Panel: No results for input(s): VITAMINB12, FOLATE, FERRITIN, TIBC, IRON, RETICCTPCT in the last 72 hours. Sepsis Labs: No results for input(s): PROCALCITON, LATICACIDVEN in the last 168 hours.  Recent Results (from the past 240 hour(s))  Surgical PCR screen     Status: Abnormal   Collection Time: 08/21/19  1:50 AM   Specimen: Nasal Mucosa; Nasal Swab  Result Value Ref Range Status   MRSA, PCR NEGATIVE NEGATIVE Final   Staphylococcus aureus POSITIVE (A) NEGATIVE Final    Comment: CRITICAL RESULT CALLED TO, READ BACK BY AND VERIFIED WITH: RN, TRosanne Sack [id number] @0423  THANEY Performed at [facility] [address] [place], [place] [address]   Body fluid culture  Status: None   Collection Time: 08/21/19  8:35 AM   Specimen: Abscess; Body Fluid  Result Value Ref Range Status   Specimen Description ABSCESS EPIDURAL  Final   Special Requests NONE  Final   Gram Stain   Final    MODERATE WBC PRESENT, PREDOMINANTLY PMN NO ORGANISMS SEEN    Culture   Final    NO GROWTH 3 DAYS Performed at [facility], [address], [place], [place] [address]    Report Status 08/24/2019 FINAL  Final         Radiology Studies: No results found.      Scheduled Meds:  acetaminophen  650 mg Oral Q4H   Or   acetaminophen (TYLENOL) oral liquid 160 mg/5 mL  650 mg Per Tube Q4H   Or   acetaminophen  650 mg Rectal Q4H   chlorhexidine  15 mL Mouth Rinse BID   Chlorhexidine Gluconate Cloth  6 each Topical Daily   famotidine  20 mg Per Tube BID   feeding supplement (PRO-STAT SUGAR FREE 64)  30 mL Per Tube TID   heparin injection (subcutaneous)  5,000 Units Subcutaneous Q8H   levETIRAcetam  500 mg Per Tube BID   mouth rinse  15 mL Mouth Rinse q12n4p   metoprolol tartrate  50 mg Per Tube Q8H   sodium  chloride flush  10-40 mL Intracatheter Q12H   Continuous Infusions:  sodium chloride 40 mL/hr at 08/28/19 0315   sodium chloride Stopped (08/21/19 2124)   sodium chloride Stopped (08/23/19 0945)   ceFEPime (MAXIPIME) IV 2 g (08/28/19 0909)   feeding supplement (OSMOLITE 1.5 CAL) 1,000 mL (08/28/19 0114)   vancomycin 1,250 mg (08/28/19 0514)     LOS: 40 days    Time spent: 40 minutes    Irine Seal, MD [facility]  If 7PM-7AM, please contact night-coverage [email or web address] 08/28/2019, 9:36 AM

## 2019-08-28 NOTE — Progress Notes (Signed)
Patient ID: Matthew Black, male   DOB: [birth year], 43 y.o.   MRN: [id number] BP (!) 132/91 (BP Location: Left Arm)   Pulse 89   Temp 98 F (36.7 C)   Resp 20   Ht 5\' 11"  (1.803 m)   Wt 91.9 kg   SpO2 100%   BMI 28.26 kg/m  Alert, moving right lower extremity spontaneously Will not follow commands Conjugate eye movements Pupils reactive No real clinical change.

## 2019-08-28 NOTE — Progress Notes (Signed)
STROKE TEAM PROGRESS NOTE   INTERVAL HISTORY Patient stable and awake alert. He remains afebrile. Brain aspiration culture remains negative. 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 Now on Abx. Poor po intake with TF around the clock, will consult dietitian.   Vitals:   08/27/19 1508 08/27/19 2000 08/28/19 0001 08/28/19 0400  BP: (!) 140/96 (!) 144/96 (!) 143/94 (!) 140/110  Pulse: 97 (!) 103 94 (!) 101  Resp: 16 (!) '21 19 18  ' Temp: 99.6 F (37.6 C) 99.2 F (37.3 C) 98.3 F (36.8 C) 99.6 F (37.6 C)  TempSrc: Oral Oral Oral Oral  SpO2: 100% 100% 100% 100%  Weight:      Height:        CBC:  Recent Labs  Lab 08/26/19 0515 08/27/19 0430  WBC 8.1 7.9  NEUTROABS 5.4  --   HGB 10.4* 11.0*  HCT 32.3* 34.2*  MCV 85.9 86.4  PLT 287 376    Basic Metabolic Panel:  Recent Labs  Lab 08/26/19 0515 08/27/19 0430  NA 135 135  K 3.5 3.6  CL 103 102  CO2 22 24  GLUCOSE 107* 117*  BUN 13 13  CREATININE 0.61 0.65  CALCIUM 8.3* 8.7*    IMAGING past 24h No results found.   PHYSICAL EXAM       General- Well nourished, well developed, middle-aged African-American male. He has right hemicraniectomy surgical incision on the scalp, bulging.  Right head bulging from crani site, much improved. Still has cortrak in place  Ophthalmologic- fundi not visualized due to noncooperation.  Cardiovascular - Regular rhythm and rate.  Neuro - patient is awake, eyes open.  Nonverbal,  following commands on the right side and moves right upper and lower extremity purposefully to command. However, did not follow central commands. Eyes in right gaze position, does not cross midline, not blinking to visual threat to the left, PERRL. Left facial droop. Tongue protrusion not corporative. Has spontaneous movement of RUE against gravity and RLE in bed 3/5 and moving spontaneously. On pain stimulation, mild withdraw of LLE, but no movement of LUE.  DTR 1+ and toes equiv. Sensation, coordination and gait not tested.   ASSESSMENT/PLAN Matthew Black is a 43 y.o. male with no significant past medical history found down x 2 days nonverbal with L hemiparesis.   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 Trace L midline shift. ELVO at R ICA, R A1, R MCA.  MRI  Large R brain infarct sparing R PCA. Cytotoxic edema but no hemorrhage. Stable trace midline shift.  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 R ICA terminus, R ACA, R MCA occluded.   CT head similar w/ low density and sweddling R ACA and MCA territories, now with 5-52m midline shift. no hemorrhage  CT head acute R ACA and MCA infarct w/ progressive swelling decompressed through craniectomy defect.   CTA head and neck stable MLS. R ICA occlusion in neck that continues into R ACA and MCA. Mild petechial hemorrhage at basal ganglia  CT Head 8/29 - Unchanged appearance of massive right MCA and ACA territory infarcts with parenchyma extending through  decompressive craniectomy. Small focus of suspected hemorrhage adjacent to the right caudate head.  CT head 07/30/19 - New hematoma within the infarcted tissue   CT head 08/01/2019 stable w/o change  CT head 08/11/19 - new patchy hemorrhage along periphery of infarcted R frontoparietal lobe (likely HT) decreased dominant anterior margin hemorrhage. Stable herniation of infarcted brain through crani site.   CT head 9/20 - worsening with increased HT on the right and new IVH  CT head 08/15/19 - slightly worsened HT from previous.   CT head 9/22 hemorrhagic R ACA and MCA territory infarcts with edematous brain bulging through Crani defect.   No change x2 days  CT head 9/24 stable hematoma, increased right hemisphere mass-effect, external herniation from Crani site, making right lateral ventriculomegaly than prior images.  CT head 9/26   MRI brain 9/26 R brain infarct. Decompressive crani w/ herniation of brain into site. Overlying subgaleal fluid collection on the R. Large irreg fluid collection antioerio marigin of crani->abscess. Large hemorrhage into R temporoparietal infarct. Mild ventricular enlargement.  2D Echo EF 60-65%  2D echo w/ bubble ordered no LRS   LE venous doppler DVT in R peroneal, L popliteal, L posterior tibial and L peroneal  LE venous doppler repeat DVT in R peroneal, L popliteal, L posterior tibial and L peroneal  TCD bubble no HITS, no PFO  LDL 83  HgbA1c 5.4  UDS positive THC  Hypercoagulable and autoimmune work up negative  Add Heparin 5000 units sq tid for VTE prophylaxis  No antithrombotic prior to admission, treated DVT with heparin IV, then  discontinued due to hemorrhagic conversion with right frontal HT, then resumed heparin IV given extensive DVT and PE, heparin IV again discontinued due to worsening HT  Therapy recommendations:  SNF (reassessed by CIR and declined again 9/16)  Disposition:  Pending  Palliative Care consulted met with wife 9/29 - wife wants full aggressive care   Right ICA occlusion  MRA and CTA showed right ICA occlusion from origin to terminal  Wife denies any head trauma  Wife stated that pt had recent aggressive exercise with weight lifting  Concerning dissection as working diagnosis of stroke etiology   Cyctotoxic cerebral edema  S/p R decompressive hemicraniectomy (Nundkumar) w/ flap R abd 07/19/2019  PICC placed - keep for now per Leonie Man  Given One dose of 23.4% 8/26; 3%  off 8/30 1700  CT repeat 07/30/19 - New parenchymal hemorrhage within the infarcted tissue  Off helmet to further release pressure  CT repeat 08/01/2019 stable w/o change  CT  head repeat 08/11/19 - new patchy hemorrhage along periphery of infarcted R frontoparietal lobe (likely HT) but previous right frontal hematoma now much reduced in size  CT head 9/20 worsening right HT with new ICH and IVH  CT Head 9/21 - slightly worsened HT from previous.   CT head 9/22 hemorrhagic R ACA and MCA territory infarcts with edematous brain bulging through Crani defect.  No change x2 days  CT head 9/24 stable hematoma, increased right hemisphere mass-effect, external herniation from Crani site, making right lateral ventriculomegaly than prior images  Liquefactive necrosis with absorption fever - less likely cerebral abscess or empyema  CT 08/20/19 - Irregular collections about the craniectomy site with question of disruption of the underlying dura, which could reflect sequelae of underlying CSF leak. Correlation with physical exam and fluid sampling suggested for further evaluation.  MRI W&WO - 08/20/19 - external herniation of brain into the craniectomy site. Large irregularly enhancing  fluid collection along the anterior margin of the craniectomy measuring approximately 5.4 x 9.8 cm. This may represent a large abscess, sterile hematoma versus infected hematoma.   Dr Kathyrn Sheriff aspirated 9/27 and sent for cx. Not a surgical candidate d/t ris increasing herniation.  Cx moderate WBC, no organisms - no growth 3 days  Tmax - 100.5->afebrile  WBC - 8.1->7.9  ID on board   Maxipime, Vancomycin 9/26>> (plan tx x 8 weeks)  Repeat MRI prior to stopping therapy after 8 weeks of vancomycin/cefepime  Notify ID once pt close to d/c  Blood cultures - neg, repeated again 08/04/19 -> no growth on empiric unasyn -> changed to vanco, cefepime and flagyl 08/05/19->now off  Lactic acid 1.2->2.0  Hepatitis panel 9/12 - neg  procalcitonin and lactic acid normal   B LE DVT Small LLL PE  LE venous doppler DVT in R peroneal, L popliteal, L posterior tibial and L peroneal  Etiology  unclear  Started IV heparin 07/26/2019  IVC filter placed 8/27. Plan retrieval in 8-12 weeks as an IP in an IR clinic  Hypercoagulable work up negative   LE venous doppler 08/02/19 repeat extensive DVT in R peroneal, L popliteal, L posterior tibial and L peroneal up to the IVC filter  CT chest Nonocclusive thrombus seen within the left posterior lower lobe segmental artery.   Treated with IV Heparin but then off secondary to intracranial hemorrhage, then restarted IV heparin given extensive DVT and PE but again stopped due to worsening HT on 9/20  Hematology consult, appreciate help - hypercoag w/u neg.  At risk for recurrent clots   Factor V Leiden, homocysteine, Prot C&S, Prothombin gene mutation all neg.   Abdominal wall hematoma  Large fluid collection seen on CT abdomen  CT abd/pel concerning for abdominal wall fluid collection likely old hematoma  Dr. Kathyrn Sheriff did fluid aspiration and seems to be old blood component  Fluid aspiration sent for culture 9/11 - no growth < 24 hrs  Repeat ABD [place] complex seroma vs liquified hematoma R abd similar to previous  Seizure-like activity  EEG continuous slowing, excessive beta activity, sleep spindle asymmetry decreased R->related to stroke and sedation. No SZ  Long Term EEG cortical dysfunction in right frontal region  Seizure precautions  Continue Keppra   UTI, resolved  Urine culture pseudomonas - treated with  to zosyn  UA 9/21 - WBC 0-5 negative   UA 9/23 - WBC 11-20  Repeat B Cx no growth  Tachycardia, resolved  HR normal now  Likely due to fever  Hydration with IVF, TF and free water  On metoprolol to 50 Q8h  EKG ST, rate 120 w/ possible LA enlargement  Troponin series neg x 3    Hyperlipidemia  Home meds:  no statin  LDL 83, goal < 70  Was on lipitor 20 mg daily   D/c statin due to elevated LFT  Consider statin at d/c if LFTs down  Elevated LFT, almost resolved  AST -  105-118-98-85-38-36  ALT - 80-68-65-164-64-53  CK [id number] [phone]  Hepatitis panel 9/12 - neg  Dysphagia . Secondary to stroke  IVF @ 40cc total  Cleared for Dysphagia 1 nectar thick liquids 10/1  Still on TF @ 60  Speech on board  Consult dietitian for better regimen to wean off TF or need PEG  Other Stroke Risk Factors  Former Cigarette smoker, quit 3 yrs ago  ETOH use  Substance abuse UDS - positive for THC   Obesity, Body  mass index is 28.26 kg/m., slightly overweight, recommend weight loss, diet and exercise as appropriate   Other Active Problems  Hypokalemia 3.2 - replaced   Normocytic anemia - hematology felt d/t ongoing blood draws Hgb 10.4->11.0  Hospital day # 40  Rosalin Hawking, MD PhD Stroke Neurology 08/28/2019 8:45 PM   To contact Stroke Continuity provider, please refer to [email or web address] After hours, contact General Neurology

## 2019-08-28 NOTE — Progress Notes (Signed)
Pharmacy Antibiotic Note  Matthew Black is a 43 y.o. 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  Pharmacy has been consulted for Vancomycin dosing.  Given the patient's abscess is in the epidural space and there is mention of cerebritis we will target a trough of 15-20. The patient had levels today that came back as a trough of 14 at 0500, then dose 1250 mg given from 0700 to 0830, then Vanc peak drawn late at 1130 (sat in lab and placed on analyzer at 1330); peak came back also at 14. He is currently on 1250 mg q8h hours His Scr is stable and UOP is good.  Plan: Continue Vancomycin at 1250 mg IV q8h  Continue Cefepime 2 gms IV q8hr Monitor renal function, clinical status, vanc levels  Height: 5\' 11"  (180.3 cm) Weight: 202 lb 9.6 oz (91.9 kg) IBW/kg (Calculated) : 75.3  Temp (24hrs), Avg:98.9 F (37.2 C), Min:98 F (36.7 C), Max:99.6 F (37.6 C)  Recent Labs  Lab 08/22/19 0442 08/23/19 0800 08/23/19 1330 08/25/19 0524 08/25/19 1036 08/26/19 0515 08/27/19 0430  WBC  --  8.5  --  8.3  --  8.1 7.9  CREATININE 0.71 0.42*  --  0.63  --  0.61 0.65  VANCOTROUGH  --   --  10* 14*  --   --   --   VANCOPEAK  --  20*  --   --  14*  --   --     Estimated Creatinine Clearance: 140.8 mL/min (by C-G formula based on SCr of 0.65 mg/dL).    Allergies  Allergen Reactions  . Hydrocodone Nausea Only    Antimicrobials this admission: Zosyn 9/21>>9/26 Vanc 9/26 >> (11/20) Cefepime 9/26 >> (11/20)  Thank you for allowing pharmacy to be a part of this patient's care.  Alanda Slim, PharmD, [facility] Clinical Pharmacist Please see AMION for all Pharmacists' Contact Phone Numbers 08/28/2019, 9:11 AM

## 2019-08-29 DIAGNOSIS — I63411 Cerebral infarction due to embolism of right middle cerebral artery: Secondary | ICD-10-CM

## 2019-08-29 LAB — GLUCOSE, CAPILLARY
Glucose-Capillary: 106 mg/dL — ABNORMAL HIGH (ref 70–99)
Glucose-Capillary: 109 mg/dL — ABNORMAL HIGH (ref 70–99)
Glucose-Capillary: 110 mg/dL — ABNORMAL HIGH (ref 70–99)
Glucose-Capillary: 114 mg/dL — ABNORMAL HIGH (ref 70–99)
Glucose-Capillary: 115 mg/dL — ABNORMAL HIGH (ref 70–99)
Glucose-Capillary: 116 mg/dL — ABNORMAL HIGH (ref 70–99)

## 2019-08-29 LAB — BASIC METABOLIC PANEL
Anion gap: 10 (ref 5–15)
BUN: 14 mg/dL (ref 6–20)
CO2: 26 mmol/L (ref 22–32)
Calcium: 9.1 mg/dL (ref 8.9–10.3)
Chloride: 101 mmol/L (ref 98–111)
Creatinine, Ser: 0.64 mg/dL (ref 0.61–1.24)
GFR calc Af Amer: >60 mL/min (ref >60)
GFR calc non Af Amer: >60 mL/min (ref >60)
Glucose, Bld: 127 mg/dL — ABNORMAL HIGH (ref 70–99)
Potassium: 3.6 mmol/L (ref 3.5–5.1)
Sodium: 137 mmol/L (ref 135–145)

## 2019-08-29 LAB — CBC
HCT: 34.7 % — ABNORMAL LOW (ref 39.0–52.0)
Hemoglobin: 11.6 g/dL — ABNORMAL LOW (ref 13.0–17.0)
MCH: 28.5 pg (ref 26.0–34.0)
MCHC: 33.4 g/dL (ref 30.0–36.0)
MCV: 85.3 fL (ref 80.0–100.0)
Platelets: 272 10*3/uL (ref 150–400)
RBC: 4.07 MIL/uL — ABNORMAL LOW (ref 4.22–5.81)
RDW: 15.1 % (ref 11.5–15.5)
WBC: 7 10*3/uL (ref 4.0–10.5)
nRBC: 0 % (ref 0.0–0.2)

## 2019-08-29 MED ORDER — OSMOLITE 1.5 CAL PO LIQD
1000.0000 mL | ORAL | Status: DC
  Administered 2019-08-29 – 2019-08-30 (×2): 1000 mL
  Filled 2019-08-29 (×3): qty 1000

## 2019-08-29 MED ORDER — ENSURE ENLIVE PO LIQD
237.0000 mL | Freq: Two times a day (BID) | ORAL | Status: DC
  Administered 2019-08-30 – 2019-09-02 (×6): 237 mL via ORAL

## 2019-08-29 NOTE — Progress Notes (Signed)
Physical Therapy Treatment Patient Details Name: Matthew Black MRN: [id number] DOB: [birth year] Today's Date: 08/29/2019    History of Present Illness Pt is a 43 y.o. M with no known PMH who presents on 8/25 after being found down at work, nonverbal with L hemiparesis. CT head showing large R hemisphere infarct with edema and mild mass effect. 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 Stable trace midline shift. S/p hemicraniectomy for  edema with abdominal flap implant. BLE DVT vasc US 8/28: acute DVT bilateral lower extremities, s/p IVC placement.  On 9/21 Repeat CT stable large hemorrhagic conversion.    PT Comments    Patient progressing with level of arousal, communication and mobility.  He shook head twice when asked if wanting to try and stand and if wanting lotion on his back, then nodded in response to his wife stating he was doing well today.  Patient at EOB able to hold head midline for several seconds prior to loosing control and noting less assist for sitting balance when head midline.  We attempted to stand today but pt not pulling up or initiating with two trials.  Seems more appropriate possibly for CIR should progression continue.  PT to follow acutely.    Follow Up Recommendations  CIR     Equipment Recommendations  Wheelchair (measurements PT);Wheelchair cushion (measurements PT);Hospital bed;Other (comment)    Recommendations for Other Services Rehab consult     Precautions / Restrictions Precautions Precautions: Fall Precaution Comments: L hemiparesis, R skull missing, abd bone flap Other Brace: helmet (not wearing due to presses on edema on R side of head)    Mobility  Bed Mobility Overal bed mobility: Needs Assistance Bed Mobility: Rolling;Sidelying to Sit;Sit to Supine Rolling: Max assist Sidelying to sit: Max assist;+2 for physical assistance   Sit to supine: Max assist;+2 for physical assistance   General bed  mobility comments: rolled to R and side to sit, pt placing foot back on bed so assist for legs and trunk to upright, mitt removed from R hand to use rail or footboard; to supine, assist for head/trunk and legs  Transfers Overall transfer level: Needs assistance   Transfers: Sit to/from Stand Sit to Stand: Total assist;+2 physical assistance;From elevated surface         General transfer comment: used back of recliner for pt to pull up on handle with R hand and lifted hips with pad on bed, pt not pulling up so sat back on bed, attempted x 2  Ambulation/Gait                 Stairs             Wheelchair Mobility    Modified Rankin (Stroke Patients Only) Modified Rankin (Stroke Patients Only) Pre-Morbid Rankin Score: No symptoms Modified Rankin: Severe disability     Balance Overall balance assessment: Needs assistance Sitting-balance support: Feet supported Sitting balance-Leahy Scale: Poor Sitting balance - Comments: mod to max A for sitting balance x about 15 minutes at EOB with focus on head midline and elevated, pt tends to lean head to L and rotate R with head and neck flexion, able to keep midline momentarily after placed there, improved upright posture with head in midline and less support needed for balance; pushes as times to L with R UE Postural control: Left lateral lean Standing balance support: Single extremity supported  Cognition Arousal/Alertness: Lethargic(but became alert during session) Behavior During Therapy: Flat affect Overall Cognitive Status: Impaired/Different from baseline Area of Impairment: Following commands;Problem solving;Attention                   Current Attention Level: Sustained   Following Commands: Follows one step commands with increased time Safety/Judgement: Decreased awareness of deficits;Decreased awareness of safety   Problem Solving: Slow processing;Decreased  initiation;Requires verbal cues;Difficulty sequencing;Requires tactile cues        Exercises Other Exercises Other Exercises: PROM head and neck in sitting Other Exercises: PROM of UE's in supine    General Comments General comments (skin integrity, edema, etc.): visualized L LE movement not sure if voluntary; wife in room , attempted to used lift pad for OOB to chair, but needing maximove lift pad so RN informed so can be ordered      Pertinent Vitals/Pain Pain Assessment: Faces Faces Pain Scale: No hurt    Home Living                      Prior Function            PT Goals (current goals can now be found in the care plan section) Progress towards PT goals: Progressing toward goals    Frequency    Min 3X/week      PT Plan Current plan remains appropriate    Co-evaluation              AM-PAC PT "6 Clicks" Mobility   Outcome Measure  Help needed turning from your back to your side while in a flat bed without using bedrails?: Total Help needed moving from lying on your back to sitting on the side of a flat bed without using bedrails?: Total Help needed moving to and from a bed to a chair (including a wheelchair)?: Total Help needed standing up from a chair using your arms (e.g., wheelchair or bedside chair)?: Total Help needed to walk in hospital room?: Total Help needed climbing 3-5 steps with a railing? : Total 6 Click Score: 6    End of Session   Activity Tolerance: Patient tolerated treatment well Patient left: with call bell/phone within reach;in bed;with family/visitor present Nurse Communication: Mobility status PT Visit Diagnosis: Hemiplegia and hemiparesis;Other symptoms and signs involving the nervous system (R29.898);Other abnormalities of gait and mobility (R26.89) Hemiplegia - Right/Left: Left Hemiplegia - dominant/non-dominant: Non-dominant Hemiplegia - caused by: Cerebral infarction     Time: 1500-1533 PT Time Calculation (min)  (ACUTE ONLY): 33 min  Charges:  $Therapeutic Activity: 8-22 mins $Neuromuscular Re-education: 8-22 mins                     Matthew Black, [place] [facility] [phone] 08/29/2019    Matthew Black 08/29/2019, 4:41 PM

## 2019-08-29 NOTE — Progress Notes (Signed)
Rehab Admissions Coordinator Note:  Per PT recommendation, this patient was screened by Raechel Ache for appropriateness for an Inpatient Acute Rehab Consult.  After chart review, it appears that progress has been slow and pt will continue to require an extended period of time in rehab. Feel SNF setting remains appropriate at this time.   Raechel Ache 08/29/2019, 5:11 PM  I can be reached at [phone].

## 2019-08-29 NOTE — Progress Notes (Signed)
STROKE TEAM PROGRESS NOTE   INTERVAL HISTORY Pt lying in bed, neuro stable, vital stable. Dietitian on board and recommend nocturnal TF to encourage po intake. If finishes meal > 50%, will consider to d/c NG tube.  Vitals:   08/29/19 0400 08/29/19 0500 08/29/19 0600 08/29/19 0719  BP: (!) 118/98  (!) 131/102 (!) 135/105  Pulse: 99 98 99 88  Resp: _0 Temp: 98.1 F (36.7 C)   (!) 97.5 F (36.4 C)  TempSrc: Oral     SpO2: 100% 99% 100% 100%  Weight:      Height:        CBC:  Recent Labs  Lab 08/26/19 0515 08/27/19 0430 08/29/19 0706  WBC 8.1 7.9 7.0  NEUTROABS 5.4  --   --   HGB 10.4* 11.0* 11.6*  HCT 32.3* 34.2* 34.7*  MCV 85.9 86.4 85.3  PLT 287 299 825    Basic Metabolic Panel:  Recent Labs  Lab 08/27/19 0430 08/29/19 0706  NA 135 137  K 3.6 3.6  CL 102 101  CO2 24 26  GLUCOSE 117* 127*  BUN 13 14  CREATININE 0.65 0.64  CALCIUM 8.7* 9.1    IMAGING past 24h No results found.   PHYSICAL EXAM    General- Well nourished, well developed, middle-aged African-American male. He has right hemicraniectomy surgical incision on the scalp, bulging.  Right head bulging from crani site, much improved. Still has cortrak in place  Ophthalmologic- fundi not visualized due to noncooperation.  Cardiovascular - Regular rhythm and rate.  Neuro - patient is awake, eyes open.  Nonverbal,  following commands on the right side and moves right upper and lower extremity purposefully to command. However, did not follow central commands. Eyes in right gaze position, does not cross midline, not blinking to visual threat to the left, PERRL. Left facial droop. Tongue protrusion not corporative. Has spontaneous movement of RUE against gravity and RLE in bed 3/5 and moving spontaneously. On pain stimulation, mild withdraw of LLE, but no movement of LUE. DTR 1+ and toes equiv. Sensation, coordination and gait not tested.   ASSESSMENT/PLAN Matthew Black is a 43 y.o. male  with no significant past medical history found down x 2 days nonverbal with L hemiparesis.   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 Trace L midline shift. ELVO at R ICA, R A1, R MCA.  MRI  Large R brain infarct sparing R PCA. Cytotoxic edema but no hemorrhage. Stable trace midline shift.  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 R ICA terminus, R ACA, R MCA occluded.   CT head similar w/ low density and sweddling R ACA and MCA territories, now with 5-47m midline shift. no hemorrhage  CT head acute R ACA and MCA infarct w/ progressive swelling decompressed through craniectomy defect.   CTA head and neck stable MLS. R ICA occlusion in neck that continues into R ACA and MCA. Mild petechial hemorrhage at basal ganglia  CT Head 8/29 - Unchanged appearance of massive right MCA and ACA territory infarcts with parenchyma extending through decompressive craniectomy. Small focus of suspected hemorrhage adjacent to the right caudate head.  CT head 07/30/19 - New hematoma within the infarcted tissue   CT head 08/01/2019 stable w/o change  CT head 08/11/19 - new patchy hemorrhage along periphery of infarcted R frontoparietal lobe (likely HT) decreased dominant anterior margin hemorrhage. Stable herniation of infarcted brain through crani site.   CT head 9/20 - worsening with increased HT on the right and new IVH  CT head 08/15/19 - slightly worsened HT from previous.   CT head 9/22 hemorrhagic R ACA and MCA territory infarcts with edematous brain bulging through Crani defect.  No change x2 days  CT head 9/24 stable hematoma, increased right hemisphere mass-effect, external herniation from Crani  site, making right lateral ventriculomegaly than prior images.  CT head 9/26   MRI brain 9/26 R brain infarct. Decompressive crani w/ herniation of brain into site. Overlying subgaleal fluid collection on the R. Large irreg fluid collection antioerio marigin of crani->abscess. Large hemorrhage into R temporoparietal infarct. Mild ventricular enlargement.  2D Echo EF 60-65%  2D echo w/ bubble ordered no LRS   LE venous doppler DVT in R peroneal, L popliteal, L posterior tibial and L peroneal  LE venous doppler repeat DVT in R peroneal, L popliteal, L posterior tibial and L peroneal  TCD bubble no HITS, no PFO  LDL 83  HgbA1c 5.4  UDS positive THC  Hypercoagulable and autoimmune work up negative  Add Heparin 5000 units sq tid for VTE prophylaxis  No antithrombotic prior to admission, treated DVT with heparin IV, then  discontinued due to hemorrhagic conversion with right frontal HT, then resumed heparin IV given extensive DVT and PE, heparin IV again discontinued due to worsening HT  Therapy recommendations:  SNF (reassessed by CIR and declined again 9/16)  Disposition:  Pending  Palliative Care consulted met with wife 9/29 - wife wants full aggressive care   Right ICA occlusion  MRA and CTA showed right ICA occlusion from origin to terminal  Wife denies any head trauma  Wife stated that pt had recent aggressive exercise with weight lifting  Concerning dissection as working diagnosis of stroke etiology   Cyctotoxic cerebral edema  S/p R decompressive hemicraniectomy (Nundkumar) w/ flap R abd 07/19/2019  PICC placed - keep for now per Leonie Man  Given One dose of 23.4% 8/26; 3%  off 8/30 1700  CT repeat 07/30/19 - New parenchymal hemorrhage within the infarcted tissue  Off helmet to further release pressure  CT repeat 08/01/2019 stable w/o change  CT head repeat 08/11/19 - new patchy hemorrhage along periphery of infarcted R frontoparietal lobe (likely HT) but previous  right frontal hematoma now much reduced in size  CT head 9/20 worsening right HT with new ICH and IVH  CT Head 9/21 - slightly worsened HT from previous.   CT head 9/22 hemorrhagic R ACA and MCA territory infarcts with edematous brain bulging through Crani defect.  No change x2 days  CT head 9/24 stable hematoma, increased right hemisphere mass-effect, external herniation from Crani site, making right lateral ventriculomegaly than prior images  Liquefactive necrosis with absorption fever - less likely cerebral abscess or empyema  CT 08/20/19 - Irregular collections about the craniectomy site with question of disruption of the underlying dura, which could reflect sequelae of underlying CSF leak. Correlation with physical exam and fluid sampling suggested for further evaluation.  MRI W&WO - 08/20/19 - external herniation of brain into the craniectomy site. Large irregularly enhancing fluid collection along the anterior margin of the craniectomy measuring approximately 5.4 x 9.8  cm. This may represent a large abscess, sterile hematoma versus infected hematoma.   Dr Kathyrn Sheriff aspirated 9/27 and sent for cx. Not a surgical candidate d/t  increasing herniation.  Cx moderate WBC, no organisms - no growth 3 days  Tmax - 100.5->afebrile  WBC - 8.1->7.9->7.0  ID on board   Maxipime, Vancomycin 9/26>> (plan tx x 8 weeks)  Repeat MRI prior to stopping therapy after 8 weeks of vancomycin/cefepime  Notify ID once pt close to d/c  Blood cultures - neg, repeated again 08/04/19 -> no growth on empiric unasyn -> changed to vanco, cefepime and flagyl 08/05/19->now off  Lactic acid 1.2->2.0  Hepatitis panel 9/12 - neg  procalcitonin and lactic acid normal   B LE DVT Small LLL PE  LE venous doppler DVT in R peroneal, L popliteal, L posterior tibial and L peroneal  Etiology unclear  Started IV heparin 07/26/2019  IVC filter placed 8/27. Plan retrieval in 8-12 weeks as an IP in an IR  clinic  Hypercoagulable work up negative   LE venous doppler 08/02/19 repeat extensive DVT in R peroneal, L popliteal, L posterior tibial and L peroneal up to the IVC filter  CT chest Nonocclusive thrombus seen within the left posterior lower lobe segmental artery.   Treated with IV Heparin but then off secondary to intracranial hemorrhage, then restarted IV heparin given extensive DVT and PE but again stopped due to worsening HT on 9/20  Hematology consult, appreciate help - hypercoag w/u neg.  At risk for recurrent clots   Factor V Leiden, homocysteine, Prot C&S, Prothombin gene mutation all neg.   Abdominal wall hematoma  Large fluid collection seen on CT abdomen  CT abd/pel concerning for abdominal wall fluid collection likely old hematoma  Dr. Kathyrn Sheriff did fluid aspiration and seems to be old blood component  Fluid aspiration sent for culture 9/11 - no growth < 24 hrs  Repeat ABD [place] complex seroma vs liquified hematoma R abd similar to previous  Seizure-like activity  EEG continuous slowing, excessive beta activity, sleep spindle asymmetry decreased R->related to stroke and sedation. No SZ  Long Term EEG cortical dysfunction in right frontal region  Seizure precautions  Continue Keppra   UTI, resolved  Urine culture pseudomonas - treated with  to zosyn  UA 9/21 - WBC 0-5 negative   UA 9/23 - WBC 11-20  Repeat B Cx no growth  Tachycardia, resolved  HR normal now  Likely due to fever  Hydration with IVF, TF and free water  On metoprolol to 50 Q8h  EKG ST, rate 120 w/ possible LA enlargement  Troponin series neg x 3    Hyperlipidemia  Home meds:  no statin  LDL 83, goal < 70  Was on lipitor 20 mg daily   D/c statin due to elevated LFT  Consider statin at d/c if LFTs down  Elevated LFT, almost resolved  AST - 105-118-98-85-38-36  ALT - 80-68-65-164-64-53  CK [phone] [phone]  Hepatitis panel 9/12 - neg  Dysphagia . Secondary  to stroke  IVF @ 40cc total  Cleared for Dysphagia 1 nectar thick liquids 10/1  on TF @ 80 with nocturnal feeding  Speech on board  Dietitian on board, if finishes meal > 50%, will consider d/c cortrak  Other Stroke Risk Factors  Former Cigarette smoker, quit 3 yrs ago  ETOH use  Substance abuse UDS - positive for THC   Other Active Problems  Hypokalemia 3.2 - replaced   Normocytic anemia - hematology  felt d/t ongoing blood draws Hgb 10.4->11.0->11.6  Hospital day # 41  Rosalin Hawking, MD PhD Stroke Neurology 08/29/2019 11:01 AM   To contact Stroke Continuity provider, please refer to [email or web address] After hours, contact General Neurology

## 2019-08-29 NOTE — Progress Notes (Addendum)
Nutrition Follow-up  DOCUMENTATION CODES:   Obesity unspecified  INTERVENTION:  Provide Ensure Enlive po BID (thickened to nectar thick consistency), each supplement provides 350 kcal and 20 grams of protein.  Transition feeds to nocturnal tube feeds via Cortrak NGT using Osmolite 1.5 formula at rate of 80 ml/hr x 12 hours (7pm-7am).  Continue 30 ml Prostat TID per tube.   Nocturnal tube feeds to provide 1740 kcal (76% of kcal needs), 105 grams of protein (90% of protein needs), 730 ml free water.  May discontinue tube feeding if meal completion consecutively >/= 50%.    NUTRITION DIAGNOSIS:   Inadequate oral intake related to acute illness as evidenced by NPO status; diet advanced; improved  GOAL:   Patient will meet greater than or equal to 90% of their needs; progressing  MONITOR:   PO intake, Supplement acceptance, Diet advancement, TF tolerance, Weight trends, Labs, I & O's, Skin  REASON FOR ASSESSMENT:   Ventilator, Consult Enteral/tube feeding initiation and management  ASSESSMENT:   43 year old male who presented to the ED on 8/25 with AMS. No known PMH. CT head showed large R hemisphere infarct with edema and mild mass effect.  8/25 -admit,emergent decompressive hemicraniectomy 8/27 - acute DVT b/l LE, IV placement 8/31 -extubated 9/01 - Cortrak placed, tube tip in stomach 9/15 - MBS with diet advanced to Dysphagia 1 with thin liquids 9/20 - pt transferred to 4N ICU as pt is more lethargic; follow up by SLP pt now NPO; CT showed significant enlarged hemorrhagic conversion on the R side 9/26 - epidural empyema and likely underlying cerebritis.   Diet has been advanced to a dysphagia 1 diet with nectar thick liquids. Meal completion has been varied from 25-100%. Intake has improved today with 100% intake at breakfast and 75% intake at lunch. Tube feeds continue to infuse at goal rate. Pt asleep during time of visit and did not awaken during RD assessment. Per  MD, pt nonverbal and follows some commands. RD to modify tube feeding orders and transition to nocturnal tube feeds to encourage po intake during the day. Will order Ensure po to aid in adequate intake. RD to continue to monitor. May discontinue tube feeding is meal intake consecutively >/= 50%.   Labs and medications reviewed.   Diet Order:   Diet Order            DIET - DYS 1 Room service appropriate? No; Fluid consistency: Nectar Thick  Diet effective now              EDUCATION NEEDS:   No education needs have been identified at this time  Skin:  Skin Assessment: Reviewed RN Assessment Skin Integrity Issues:: Stage II, Incisions Stage II: L face Incisions: abdomen, head  Last BM:  10/4  Height:   Ht Readings from Last 1 Encounters:  08/02/19 5\' 11"  (1.803 m)    Weight:   Wt Readings from Last 1 Encounters:  08/26/19 91.9 kg    Ideal Body Weight:  78.2 kg  BMI:  Body mass index is 28.26 kg/m.  Estimated Nutritional Needs:   Kcal:  [id number] kcals  Protein:  117-135  Fluid:  >/= 2.1 L/day    Corrin Parker, MS, RD, LDN Pager # [phone] After hours/ weekend pager # [phone]

## 2019-08-29 NOTE — Progress Notes (Signed)
PROGRESS NOTE    Matthew Black  [id number] DOB: January 16, 1976 DOA: 07/19/2019 PCP: Colon Branch, MD    Brief Narrative:  Patient 43 year old gentleman with no significant past medical history who was found down x2 days nonverbal with left hemiparesis.  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  Patient status post hemicraniectomy with abdominal flap implant.  Patient subsequently underwent a hemorrhagic conversion with hematoma 07/30/2019 which worsened on 9/20 with right infarct area.  Patient was seen and followed by neurology in the ICU.  Patient subsequently underwent right decompressive hemicraniectomy by Dr. Kathyrn Sheriff with flap on 07/19/2019. 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  Patient underwent aspiration on 08/21/2019 per Dr. Kathyrn Sheriff and cultures sent.  Patient noted to be not a surgical candidate due to increasing herniation.  ID was consulted and patient started empirically on IV vancomycin IV cefepime. Patient also noted to have a bilateral lower extremity DVT and small left lower lobe PE per venous Dopplers and CT angiogram.  Heparin was started on 07/26/2019.  IVC filter placed 07/21/2019 with plan retrieval 8 to 12 weeks if patient still hospitalized or in the outpatient IR clinic.  Hypercoagulable work-up done was negative.  Oncology consulted.  Patient was on IV heparin however subsequently discontinued due to intracranial hemorrhage and then subsequently resumed given extensive DVT and PE but then subsequently discontinued due to worsening hemorrhage on 08/14/2019.  Patient also noted to have abdominal wall hematoma.  Patient  also noted to have some seizure-like activity subsequently placed on seizure precautions and on Keppra.  Patient status post treatment of Pseudomonas UTI with Zosyn.  Noted with dysphagia with speech therapy following.  Patient subsequently transferred to tried service on 08/25/2019 with neurology following.   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  Questionable etiology per neurology concerned that patient may have had a possible right ICA dissection from exercise.  Patient noted to have hemorrhagic conversion with hematoma 07/30/2019 with worsening on 08/14/2019 in the right infarct. Patient followed by neurology throughout the hospitalization.  Patient had head CT done that showed a large right brain infarct with edema right ACA and MCA territories.  MRI showed large right brain infarct with cytotoxic edema but no hemorrhage.  MRA head and neck showed right ICA occluded at origin.  Right ICA terminus, R ACA, R MCA occluded.  Head CT which was done showed 5 to 6 mm midline shift with no hemorrhage.  Patient underwent acute head CT that showed  progression with swelling decompressive craniectomy defect.  CTA head and neck was done that showed right ICA occlusion neck that continues into right ACA and MCA. CT head done 07/23/2019 showed unchanged  appearance of massive right MCA and ACA territory infarct with parenchyma extending through decompressive craniectomy.  Small focus of suspected hemorrhage adjacent to right caudate head. CT head done 07/30/2019 with new hematoma within infarcted tissue. CT head done 08/01/2019 with no significant change. CT head done 08/11/2019 with new patchy hemorrhage along periphery of infarcted right frontal parietal lobe, decreased dominant anterior margin hemorrhage.  Stable herniation of infarcted brain through craniectomy site. CT head done 08/14/2019 with worsening with increased hemorrhagic transformation on the right and new IVH. CT head 08/15/2019 slightly worsened hemorrhagic transformation from previous. CT head done 08/16/2019 with hemorrhagic right ACA and MCA territory infarct with edematous brain bulging through Crani defect. CT head 08/18/2019 with stable hematoma, increased right hemisphere mass-effect, external herniation from Crani site, with market right lateral ventriculomegaly than prior images. 2D echo done with a EF of 60 to 65% of emboli. 2D echo with bubble study ordered was negative for PFO. Lower extremity Dopplers done showed bilateral DVT. Fasting lipid panel done with LDL of 83.  Patient underwent hypercoagulable autoimmune work-up which was negative.  Patient placed on heparin 5000 units 3 times daily for DVT prophylaxis which was subsequently discontinued as patient underwent hemorrhagic transformation.  Patient was placed back on IV heparin due to extensive DVT/PE which was subsequently discontinued due to worsening hemorrhagic transformation.  Patient seen by CIR and declined on 08/10/2019.  Palliative care following.  Neurology following.  PT/OT/ST.  2.  Right ICA occlusion Per MRA.  Per neurology patient's wife denies any recent head trauma and stated patient had recent aggressive exercise with weight lifting.  Per neurology dissection likely etiology of patient's stroke.  Per  neurology.  3.  Cytotoxic cerebral edema Status post right decompressive hemicraniectomy per Dr. Kathyrn Sheriff with right flap 07/19/2019.  Patient was placed on hypertonic saline per neurology which was discontinued 07/24/2019.  Patient had repeat head CT done 07/30/2019 with new parenchymal hemorrhage with ineffective tissue.  Helmet off for further pressure release.  Repeat CT done 08/01/2019 with no significant changes.  Repeat head CT done 08/11/2019 with new patchy hemorrhage along periphery of infarcted right frontoparietal lobe likely hemorrhagic transformation.  Repeat head CT done 920 with worsening right hemorrhagic transformation and new ICH and IVH.  CT head done 08/15/2019 with slightly worsened hemorrhagic transformation from before.  Repeat head CT done 08/16/2019 with hemorrhagic right ACA and MCA territory infarcts with edematous brain bulging through craniectomy defect with no cystic change x2 days.  Repeat head CT done 9/24 with stable hematoma, increased right hemisphere mass-effect, external herniation from Crani site making right lateral ventriculomegaly than prior images.  Neurology discussed with neurosurgery and was felt no further intervention needed at this time.  4.  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 Lucia Gaskins, 08/21/2019 and sent for cultures.  Cultures with no growth to date.  Patient noted not to be a surgical candidate due to increasing herniation.  ID consulted and patient started on cefepime and vancomycin on 08/20/2019 with duration of therapy 6 to 8 weeks per ID and patient likely needs repeat imaging of the head prior to discontinuation of antibiotics.  ID following.  Will need outpatient follow-up with ID.  5.  Bilateral lower extremity DVT/small left lower lobe PE Noted on lower extremity Dopplers.  Unclear etiology.  Patient initially started on IV heparin on 07/26/2019.  IVC filter was placed 07/21/2019 with plan retrieval 8 to 12 weeks per  IR.  Hypercoagulable work-up which was done was negative.  Oncology consulted.  Patient underwent repeat lower extremity Dopplers 08/02/2019 that showed extensive DVT in the right peroneal, left popliteal, left posterior tibial and left peroneal up to the IVC filter.  Chest CT which was done showed nonocclusive thrombus within the left posterior lower lobe segmental artery.  Patient treated with IV heparin which was subsequently discontinued secondary to hemorrhagic transformation/intracranial hemorrhage.  Will need outpatient follow-up with hematology.   6.  Abdominal wall hematoma Noted on CT abdomen and pelvis.  Dr. Kathyrn Sheriff did fluid aspiration and seem to be old blood component.  Cultures with no growth to date.  Repeat abdominal ultrasound shows a complex seroma versus liquefied hematoma.  7.  Seizure-like activity Patient underwent EEG that showed continuous slowing, excessive beta activity likely secondary to acute CVA.  Patient underwent long-term EEG that showed cortical dysfunction in the right frontal region.  No noted seizures overnight.  Continue seizure precautions.  Continue current regimen of Keppra.  Per neurology.   8.  Pseudomonas UTI  Status post full course of IV antibiotics with Zosyn.   9.  Hyperlipidemia LDL noted to be at 83.  Goal LDL less than 70.  Statin discontinued secondary to transaminitis.  Consider resumption of statin on discharge if LFTs have trended down.  10.  Dysphagia Secondary to acute CVA.  Patient has been assessed by speech therapy and patient on a dysphagia 1 diet.  Patient currently receiving tube feeds which we will continue for now.  Per RN patient ate about half of his lunch yesterday.  Nutrition consulted per neurology.  11.  Hypokalemia Potassium at 3.6.     DVT prophylaxis: SCDs Code Status: Full Family Communication: No family at bedside. Disposition Plan: SNF   Consultants:   Palliative care: Dr. Rowe Pavy 08/23/2019  ID: Dr. Tommy Medal  08/20/2019  ID: Dr. Prince Rome 08/05/2019  Oncology: Dr. Irene Limbo 08/03/2019  [facility]: Dr. Lorin Mercy 08/02/2019  Rehab medicine Dr. Posey Pronto 07/27/2019  Neurosurgery: Dr. Kathyrn Sheriff 07/19/2019  PCCM: Dr Nelda Marseille 07/19/2019  Procedures:   - CT head 08/20/2019, 08/18/2019, 08/16/2019, 08/14/2019, 08/11/2019, 08/01/2019, 07/30/2019, 07/23/2019 CT angios head and neck 07/21/2019 CT angiogram chest 08/01/2019 CT abdomen and pelvis 08/04/2019 Abdominal ultrasound 08/11/2019 2D echo 07/20/2019,  2D echo bubble study 08/03/2019 Lower extremity Dopplers 07/21/2019, 08/02/2019  MRI head 08/20/2019, 07/19/2019 Modified barium swallow 08/25/2019 MRA head and neck 07/19/2019  Antimicrobials:   IV vancomycin 08/20/2019  IV cefepime 08/20/2019  IV Zosyn 08/15/2019>>>> 08/20/2019  IV Flagyl 08/22/2019 x 1   Subjective: Patient sleeping but arousable.  Nonverbal.  Follows some commands.  Right-sided gaze.    Objective: Vitals:   08/29/19 0400 08/29/19 0500 08/29/19 0600 08/29/19 0719  BP: (!) 118/98  (!) 131/102 (!) 135/105  Pulse: 99 98 99 88  Resp: 14 15 14 18   Temp: 98.1 F (36.7 C)   (!) 97.5 F (36.4 C)  TempSrc: Oral     SpO2: 100% 99% 100% 100%  Weight:      Height:        Intake/Output Summary (Last 24 hours) at 08/29/2019 0951 Last data filed at 08/29/2019 0800 Gross per 24 hour  Intake 4862.74 ml  Output 2 ml  Net 4860.74 ml   Filed Weights   08/24/19 0500 08/25/19 0500 08/26/19 0500  Weight: 92.4 kg  92.3 kg 91.9 kg    Examination:  General exam: NAD.  Right hemicraniectomy surgical incision on scalp with bulging on the right, slight purulent drainage noted on pillowcase. Respiratory system: Lungs clear to auscultation bilaterally anterior lung fields.  No wheezes, no crackles, no rhonchi.  Normal respiratory effort. Cardiovascular system: Regular rate rhythm no murmurs rubs or gallops.  No JVD.  No lower extremity edema.  Gastrointestinal system: Abdomen is soft, nontender, nondistended, positive  bowel sounds.  No rebound.  No guarding.  Right lower abdomen with a firmness noted likely secondary to hematoma. Central nervous system: Alert.  Following some commands.  Moves right upper extremity and right lower extremity.  Right gaze position.  Left hemiparesis.  Extremities: Symmetric 5 x 5 power. Skin: No rashes, lesions or ulcers Psychiatry: Judgement and insight unable to assess as patient nonverbal.  Mood & affect appropriate.     Data Reviewed: I have personally reviewed following labs and imaging studies  CBC: Recent Labs  Lab 08/23/19 0800 08/25/19 0524 08/26/19 0515 08/27/19 0430 08/29/19 0706  WBC 8.5 8.3 8.1 7.9 7.0  NEUTROABS  --   --  5.4  --   --   HGB 10.5* 10.6* 10.4* 11.0* 11.6*  HCT 33.0* 32.8* 32.3* 34.2* 34.7*  MCV 86.6 85.6 85.9 86.4 85.3  PLT 234 278 287 299 [id number]   Basic Metabolic Panel: Recent Labs  Lab 08/23/19 0800 08/25/19 0524 08/26/19 0515 08/27/19 0430 08/29/19 0706  NA 137 135 135 135 137  K 3.8 3.3* 3.5 3.6 3.6  CL 105 103 103 102 101  CO2 22 22 22 24 26   GLUCOSE 125* 122* 107* 117* 127*  BUN 16 14 13 13 14   CREATININE 0.42* 0.63 0.61 0.65 0.64  CALCIUM 8.7* 8.6* 8.3* 8.7* 9.1   GFR: Estimated Creatinine Clearance: 140.8 mL/min (by C-G formula based on SCr of 0.64 mg/dL). Liver Function Tests: No results for input(s): AST, ALT, ALKPHOS, BILITOT, PROT, ALBUMIN in the last 168 hours. No results for input(s): LIPASE, AMYLASE in the last 168 hours. No results for input(s): AMMONIA in the last 168 hours. Coagulation Profile: No results for input(s): INR, PROTIME in the last 168 hours. Cardiac Enzymes: No results for input(s): CKTOTAL, CKMB, CKMBINDEX, TROPONINI in the last 168 hours. BNP (last 3 results) No results for input(s): PROBNP in the last 8760 hours. HbA1C: No results for input(s): HGBA1C in the last 72 hours. CBG: Recent Labs  Lab 08/28/19 1707 08/28/19 2012 08/28/19 2354 08/29/19 0445 08/29/19 0720  GLUCAP 103*  105* 109* 116* 114*   Lipid Profile: No results for input(s): CHOL, HDL, LDLCALC, TRIG, CHOLHDL, LDLDIRECT in the last 72 hours. Thyroid Function Tests: No results for input(s): TSH, T4TOTAL, FREET4, T3FREE, THYROIDAB in the last 72 hours. Anemia Panel: No results for input(s): VITAMINB12, FOLATE, FERRITIN, TIBC, IRON, RETICCTPCT in the last 72 hours. Sepsis Labs: No results for input(s): PROCALCITON, LATICACIDVEN in the last 168 hours.  Recent Results (from the past 240 hour(s))  Surgical PCR screen     Status: Abnormal   Collection Time: 08/21/19  1:50 AM   Specimen: Nasal Mucosa; Nasal Swab  Result Value Ref Range Status   MRSA, PCR NEGATIVE NEGATIVE Final   Staphylococcus aureus POSITIVE (A) NEGATIVE Final    Comment: CRITICAL RESULT CALLED TO, READ BACK BY AND VERIFIED WITH: RN, TRosanne Sack [id number] @0423  THANEY Performed at [facility] [address], [place], [place] [address]   Body fluid culture  Status: None   Collection Time: 08/21/19  8:35 AM   Specimen: Abscess; Body Fluid  Result Value Ref Range Status   Specimen Description ABSCESS EPIDURAL  Final   Special Requests NONE  Final   Gram Stain   Final    MODERATE WBC PRESENT, PREDOMINANTLY PMN NO ORGANISMS SEEN    Culture   Final    NO GROWTH 3 DAYS Performed at [facility], [address], [place], [place] [address]    Report Status 08/24/2019 FINAL  Final         Radiology Studies: No results found.      Scheduled Meds:  acetaminophen  650 mg Oral Q4H   Or   acetaminophen (TYLENOL) oral liquid 160 mg/5 mL  650 mg Per Tube Q4H   Or   acetaminophen  650 mg Rectal Q4H   chlorhexidine  15 mL Mouth Rinse BID   Chlorhexidine Gluconate Cloth  6 each Topical Daily   famotidine  20 mg Per Tube BID   feeding supplement (PRO-STAT SUGAR FREE 64)  30 mL Per Tube TID   heparin injection (subcutaneous)  5,000 Units Subcutaneous Q8H   levETIRAcetam  500 mg Per Tube BID   mouth  rinse  15 mL Mouth Rinse q12n4p   metoprolol tartrate  50 mg Per Tube Q8H   sodium chloride flush  10-40 mL Intracatheter Q12H   Continuous Infusions:  sodium chloride 40 mL/hr at 08/28/19 0315   sodium chloride Stopped (08/21/19 2124)   sodium chloride Stopped (08/23/19 0945)   ceFEPime (MAXIPIME) IV 2 g (08/29/19 0932)   feeding supplement (OSMOLITE 1.5 CAL) 60 mL/hr at 08/28/19 1840   vancomycin 1,250 mg (08/29/19 0527)     LOS: 41 days    Time spent: 40 minutes    Irine Seal, MD [facility]  If 7PM-7AM, please contact night-coverage [email or web address] 08/29/2019, 9:51 AM

## 2019-08-30 LAB — GLUCOSE, CAPILLARY
Glucose-Capillary: 98 mg/dL (ref 70–99)
Glucose-Capillary: 120 mg/dL — ABNORMAL HIGH (ref 70–99)
Glucose-Capillary: 88 mg/dL (ref 70–99)
Glucose-Capillary: 107 mg/dL — ABNORMAL HIGH (ref 70–99)
Glucose-Capillary: 113 mg/dL — ABNORMAL HIGH (ref 70–99)
Glucose-Capillary: 91 mg/dL (ref 70–99)

## 2019-08-30 NOTE — Progress Notes (Signed)
  Speech Language Pathology Treatment: Cognitive-Linquistic  Patient Details Name: Matthew Black MRN: [id number] DOB: 08-15-1976 Today's Date: 08/30/2019 Time: 0930-1006 SLP Time Calculation (min) (ACUTE ONLY): 36 min  Assessment / Plan / Recommendation Clinical Impression  Pt presented lethargic with tactile cues necessary to maintain eye opening. Treatment focused on cognition, communication, naming, and voicing facilitation. Severe L neglect noted on all tasks. When asked to write the name of his wife, he wrote his name clearly then overlapped on R side with small illegible words. Max visual and verbal cues to write on L did not increase L attention, pt would instead move over SLPs hand to write on R side. Given 3 objects during object identification task pt did not respond with max cueing for first object and required min cueing with following objects. Achieved 75% accuracy on set of 3 obect identification task with cues and 100% accuracy when asked to demonstrate object's function independently. Articulatory placement training provided with max visual, verbal, and tactile cueing. Pt did not produce any oral movement or initiate voicing, consistent with previous performance on initial assessment. He responded with head shake for "no" but it is of note that he has not produced head nod for "yes" as of this session, head gestures may not be accurate for yes-no responses. Possibly unable to express yes with head gestures. Pt did not respond to yes-no board when offered. Continue skilled intervention. In agreement with SNF placement recommendation based on pts decreased endurance and current performance given time post-onset.    HPI HPI: Pt is a 43 y.o. with no known PMH who presents on 8/25 after being found down at work, nonverbal with L hemiparesis. 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 Stable trace midline shift. S/p hemicraniectomy with abdominal flap  implant. Intubated 8/26-8/31. CXR worsened atelectasis in the right lower lobe. MBS completed 08/09/2019 with recommendation for Dys1/thin liquids. Pt demonstrated increased lethargy and vomiting. Hemorrhagic conversion on CT, transferred back to ICU. Head CT 08/14/19 with increase in hemorrhage, edema, mass effect and compression of ventricle      SLP Plan  Continue with current plan of care       Recommendations                   Oral Care Recommendations: Oral care QID;Staff/trained caregiver to provide oral care Follow up Recommendations: Skilled Nursing facility SLP Visit Diagnosis: Cognitive communication deficit (R41.841);Apraxia (R48.2);Aphasia (R47.01) Plan: Continue with current plan of care       GO                Matthew Black 08/30/2019, 10:18 AM

## 2019-08-30 NOTE — Progress Notes (Signed)
PROGRESS NOTE    Matthew Black  [id number] DOB: 1976/11/19 DOA: 07/19/2019 PCP: Colon Branch, MD    Brief Narrative:  Patient 43 year old gentleman with no significant past medical history who was found down x2 days nonverbal with left hemiparesis.  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  Patient status post hemicraniectomy with abdominal flap implant.  Patient subsequently underwent a hemorrhagic conversion with hematoma 07/30/2019 which worsened on 9/20 with right infarct area.  Patient was seen and followed by neurology in the ICU.  Patient subsequently underwent right decompressive hemicraniectomy by Dr. Kathyrn Sheriff with flap on 07/19/2019. 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  Patient underwent aspiration on 08/21/2019 per Dr. Kathyrn Sheriff and cultures sent.  Patient noted to be not a surgical candidate due to increasing herniation.  ID was consulted and patient started empirically on IV vancomycin IV cefepime. Patient also noted to have a bilateral lower extremity DVT and small left lower lobe PE per venous Dopplers and CT angiogram.  Heparin was started on 07/26/2019.  IVC filter placed 07/21/2019 with plan retrieval 8 to 12 weeks if patient still hospitalized or in the outpatient IR clinic.  Hypercoagulable work-up done was negative.  Oncology consulted.  Patient was on IV heparin however subsequently discontinued due to intracranial hemorrhage and then subsequently resumed given extensive DVT and PE but then subsequently discontinued due to worsening hemorrhage on 08/14/2019.  Patient also noted to have abdominal wall hematoma.  Patient  also noted to have some seizure-like activity subsequently placed on seizure precautions and on Keppra.  Patient status post treatment of Pseudomonas UTI with Zosyn.  Noted with dysphagia with speech therapy following.  Patient subsequently transferred to tried service on 08/25/2019 with neurology following.   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  Questionable etiology per neurology concerned that patient may have had a possible right ICA dissection from exercise.  Patient noted to have hemorrhagic conversion with hematoma 07/30/2019 with worsening on 08/14/2019 in the right infarct. Patient followed by neurology throughout the hospitalization.  Patient had head CT done that showed a large right brain infarct with edema right ACA and MCA territories.  MRI showed large right brain infarct with cytotoxic edema but no hemorrhage.  MRA head and neck showed right ICA occluded at origin.  Right ICA terminus, R ACA, R MCA occluded.  Head CT which was done showed 5 to 6 mm midline shift with no hemorrhage.  Patient underwent acute head CT that showed  progression with swelling decompressive craniectomy defect.  CTA head and neck was done that showed right ICA occlusion neck that continues into right ACA and MCA. CT head done 07/23/2019 showed unchanged  appearance of massive right MCA and ACA territory infarct with parenchyma extending through decompressive craniectomy.  Small focus of suspected hemorrhage adjacent to right caudate head. CT head done 07/30/2019 with new hematoma within infarcted tissue. CT head done 08/01/2019 with no significant change. CT head done 08/11/2019 with new patchy hemorrhage along periphery of infarcted right frontal parietal lobe, decreased dominant anterior margin hemorrhage.  Stable herniation of infarcted brain through craniectomy site. CT head done 08/14/2019 with worsening with increased hemorrhagic transformation on the right and new IVH. CT head 08/15/2019 slightly worsened hemorrhagic transformation from previous. CT head done 08/16/2019 with hemorrhagic right ACA and MCA territory infarct with edematous brain bulging through Crani defect. CT head 08/18/2019 with stable hematoma, increased right hemisphere mass-effect, external herniation from Crani site, with market right lateral ventriculomegaly than prior images. 2D echo done with a EF of 60 to 65% of emboli. 2D echo with bubble study ordered was negative for PFO. Lower extremity Dopplers done showed bilateral DVT. Fasting lipid panel done with LDL of 83.  Patient underwent hypercoagulable autoimmune work-up which was negative.  Patient placed on heparin 5000 units 3 times daily for DVT prophylaxis which was subsequently discontinued as patient underwent hemorrhagic transformation.  Patient was placed back on IV heparin due to extensive DVT/PE which was subsequently discontinued due to worsening hemorrhagic transformation.  Patient seen by CIR and declined on 08/10/2019.  Palliative care following.  Neurology following.  PT/OT/ST.  2.  Right ICA occlusion Per MRA.  Per neurology patient's wife denies any recent head trauma and stated patient had recent aggressive exercise with weight lifting.  Per neurology dissection likely etiology of patient's stroke.  Per  neurology.  3.  Cytotoxic cerebral edema Status post right decompressive hemicraniectomy per Dr. Kathyrn Sheriff with right flap 07/19/2019.  Patient was placed on hypertonic saline per neurology which was discontinued 07/24/2019.  Patient had repeat head CT done 07/30/2019 with new parenchymal hemorrhage with ineffective tissue.  Helmet off for further pressure release.  Repeat CT done 08/01/2019 with no significant changes.  Repeat head CT done 08/11/2019 with new patchy hemorrhage along periphery of infarcted right frontoparietal lobe likely hemorrhagic transformation.  Repeat head CT done 920 with worsening right hemorrhagic transformation and new ICH and IVH.  CT head done 08/15/2019 with slightly worsened hemorrhagic transformation from before.  Repeat head CT done 08/16/2019 with hemorrhagic right ACA and MCA territory infarcts with edematous brain bulging through craniectomy defect with no cystic change x2 days.  Repeat head CT done 9/24 with stable hematoma, increased right hemisphere mass-effect, external herniation from Crani site making right lateral ventriculomegaly than prior images.  Neurology discussed with neurosurgery and was felt no further intervention needed at this time.  4.  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 Lucia Gaskins, 08/21/2019 and sent for cultures.  Cultures with no growth to date.  Patient noted not to be a surgical candidate due to increasing herniation.  ID consulted and patient started on cefepime and vancomycin on 08/20/2019 with duration of therapy 6 to 8 weeks per ID and patient likely needs repeat imaging of the head prior to discontinuation of antibiotics.  ID following.  Per neurology patient likely with a liquefaction necrosis with absorption fever.  Cultures with no growth to date.  Will need  to notify ID close to discharge.  Will need outpatient follow-up with ID.  5.  Bilateral lower extremity DVT/small left lower lobe PE Noted on lower  extremity Dopplers.  Unclear etiology.  Patient initially started on IV heparin on 07/26/2019.  IVC filter was placed 07/21/2019 with plan retrieval 8 to 12 weeks per IR.  Hypercoagulable work-up which was done was negative.  Oncology consulted.  Patient underwent repeat lower extremity Dopplers 08/02/2019 that showed extensive DVT in the right peroneal, left popliteal, left posterior tibial and left peroneal up to the IVC filter.  Chest CT which was done showed nonocclusive thrombus within the left posterior lower lobe segmental artery.  Patient treated with IV heparin which was subsequently discontinued secondary to hemorrhagic transformation/intracranial hemorrhage.  Will need outpatient follow-up with hematology.   6.  Abdominal wall hematoma Noted on CT abdomen and pelvis.  Dr. Kathyrn Sheriff did fluid aspiration and seem to be old blood component.  Cultures with no growth to date.  Repeat abdominal ultrasound shows a complex seroma versus liquefied hematoma.  7.  Seizure-like activity Patient underwent EEG that showed continuous slowing, excessive beta activity likely secondary to acute CVA.  Patient underwent long-term EEG that showed cortical dysfunction in the right frontal region.  No noted seizures overnight.  Continue seizure precautions.  Continue current regimen of Keppra.  Per neurology.   8.  Pseudomonas UTI  Status post full course of IV antibiotics with Zosyn.   9.  Hyperlipidemia LDL noted to be at 83.  Goal LDL less than 70.  Statin discontinued secondary to transaminitis.  Consider resumption of statin on discharge if LFTs have trended down.  10.  Dysphagia Secondary to acute CVA.  Patient has been assessed by speech therapy and patient on a dysphagia 1 diet.  Patient currently receiving tube feeds which we will continue for now.  Patient seems to have eating 100% of his meal this morning.  Continue tube feeds.  If patient still with good oral intake will try to wean down tube feeds  tomorrow.  Nutrition consulted per neurology and following.   11.  Hypokalemia Potassium at 3.6.     DVT prophylaxis: SCDs Code Status: Full Family Communication: No family at bedside. Disposition Plan: SNF once patient tolerating oral intake, tube feeds have been weaned off, tolerating greater than 75% of diet.   Consultants:   Palliative care: Dr. Rowe Pavy 08/23/2019  ID: Dr. Tommy Medal 08/20/2019  ID: Dr. Prince Rome 08/05/2019  Oncology: Dr. Irene Limbo 08/03/2019  [facility]: Dr. Lorin Mercy 08/02/2019  Rehab medicine Dr. Posey Pronto 07/27/2019  Neurosurgery: Dr. Kathyrn Sheriff 07/19/2019  PCCM: Dr Nelda Marseille 07/19/2019  Procedures:   - CT head 08/20/2019, 08/18/2019, 08/16/2019, 08/14/2019, 08/11/2019, 08/01/2019, 07/30/2019, 07/23/2019 CT angios head and neck 07/21/2019 CT angiogram chest 08/01/2019 CT abdomen and pelvis 08/04/2019 Abdominal ultrasound 08/11/2019 2D echo 07/20/2019,  2D echo bubble study 08/03/2019 Lower extremity Dopplers 07/21/2019, 08/02/2019  MRI head 08/20/2019, 07/19/2019 Modified barium swallow 08/25/2019 MRA head and neck 07/19/2019  Antimicrobials:   IV vancomycin 08/20/2019  IV cefepime 08/20/2019  IV Zosyn 08/15/2019>>>> 08/20/2019  IV Flagyl 08/22/2019 x 1   Subjective: Patient sleeping soundly.  Tray at bedside noted to be clean.  Right-sided gaze.  Follow.    Objective: Vitals:   08/30/19 0928 08/30/19 1012 08/30/19 1255 08/30/19 1547  BP:   128/86 (!) 127/100  Pulse: (!) 104 (!) 104 88 92  Resp: 14 20 20 20   Temp:   98.9 F (37.2 C) 98.3 F (36.8 C)  TempSrc:   Oral Oral  SpO2: 99% 97% 93% 100%  Weight:      Height:        Intake/Output Summary (Last 24 hours) at 08/30/2019 1710 Last data filed at 08/30/2019 1200 Gross per 24 hour  Intake 480 ml  Output 2650 ml  Net -2170 ml   Filed Weights   08/24/19 0500 08/25/19 0500 08/26/19 0500  Weight: 92.4 kg 92.3 kg 91.9 kg    Examination:  General exam: NAD. Right hemicraniectomy surgical incision on scalp with bulging  on the right, slight purulent drainage noted on pillowcase. Respiratory system: CTA B anterior lung fields.  No wheezes, no crackles, no rhonchi.  Normal respiratory effort.  Cardiovascular system: RRR no murmurs rubs or gallops.  No JVD.  No lower extremity edema.  Gastrointestinal system: Abdomen is nontender, nondistended, soft, positive bowel sounds.  No rebound.  No guarding. Right lower abdomen with a firmness noted likely secondary to hematoma. Central nervous system: Alert.  Following some commands.  Moves right upper extremity and right lower extremity.  Right gaze position.  Left hemiparesis.  Extremities: Symmetric 5 x 5 power. Skin: No rashes, lesions or ulcers Psychiatry: Judgement and insight unable to assess as patient nonverbal.  Mood & affect appropriate.     Data Reviewed: I have personally reviewed following labs and imaging studies  CBC: Recent Labs  Lab 08/25/19 0524 08/26/19 0515 08/27/19 0430 08/29/19 0706  WBC 8.3 8.1 7.9 7.0  NEUTROABS  --  5.4  --   --   HGB 10.6* 10.4* 11.0* 11.6*  HCT 32.8* 32.3* 34.2* 34.7*  MCV 85.6 85.9 86.4 85.3  PLT 278 287 299 [id number]   Basic Metabolic Panel: Recent Labs  Lab 08/25/19 0524 08/26/19 0515 08/27/19 0430 08/29/19 0706  NA 135 135 135 137  K 3.3* 3.5 3.6 3.6  CL 103 103 102 101  CO2 22 22 24 26   GLUCOSE 122* 107* 117* 127*  BUN 14 13 13 14   CREATININE 0.63 0.61 0.65 0.64  CALCIUM 8.6* 8.3* 8.7* 9.1   GFR: Estimated Creatinine Clearance: 140.8 mL/min (by C-G formula based on SCr of 0.64 mg/dL). Liver Function Tests: No results for input(s): AST, ALT, ALKPHOS, BILITOT, PROT, ALBUMIN in the last 168 hours. No results for input(s): LIPASE, AMYLASE in the last 168 hours. No results for input(s): AMMONIA in the last 168 hours. Coagulation Profile: No results for input(s): INR, PROTIME in the last 168 hours. Cardiac Enzymes: No results for input(s): CKTOTAL, CKMB, CKMBINDEX, TROPONINI in the last 168 hours. BNP  (last 3 results) No results for input(s): PROBNP in the last 8760 hours. HbA1C: No results for input(s): HGBA1C in the last 72 hours. CBG: Recent Labs  Lab 08/29/19 1653 08/29/19 2155 08/30/19 0801 08/30/19 1251 08/30/19 1543  GLUCAP 98 115* 120* 88 107*   Lipid Profile: No results for input(s): CHOL, HDL, LDLCALC, TRIG, CHOLHDL, LDLDIRECT in the last 72 hours. Thyroid Function Tests: No results for input(s): TSH, T4TOTAL, FREET4, T3FREE, THYROIDAB in the last 72 hours. Anemia Panel: No results for input(s): VITAMINB12, FOLATE, FERRITIN, TIBC, IRON, RETICCTPCT in the last 72 hours. Sepsis Labs: No results for input(s): PROCALCITON, LATICACIDVEN in the last 168 hours.  Recent Results (from the past 240 hour(s))  Surgical PCR screen     Status: Abnormal   Collection Time: 08/21/19  1:50 AM   Specimen: Nasal Mucosa; Nasal Swab  Result Value Ref Range Status   MRSA, PCR NEGATIVE NEGATIVE Final   Staphylococcus  aureus POSITIVE (A) NEGATIVE Final    Comment: CRITICAL RESULT CALLED TO, READ BACK BY AND VERIFIED WITH: RN, TRosanne Sack [id number] @0423  THANEY Performed at [facility], [place] [address], [place], [place] [address]   Body fluid culture     Status: None   Collection Time: 08/21/19  8:35 AM   Specimen: Abscess; Body Fluid  Result Value Ref Range Status   Specimen Description ABSCESS EPIDURAL  Final   Special Requests NONE  Final   Gram Stain   Final    MODERATE WBC PRESENT, PREDOMINANTLY PMN NO ORGANISMS SEEN    Culture   Final    NO GROWTH 3 DAYS Performed at [facility], [address], [place], [place] [address]    Report Status 08/24/2019 FINAL  Final         Radiology Studies: No results found.      Scheduled Meds:  acetaminophen  650 mg Oral Q4H   Or   acetaminophen (TYLENOL) oral liquid 160 mg/5 mL  650 mg Per Tube Q4H   Or   acetaminophen  650 mg Rectal Q4H   chlorhexidine  15 mL Mouth Rinse BID   Chlorhexidine Gluconate  Cloth  6 each Topical Daily   famotidine  20 mg Per Tube BID   feeding supplement (ENSURE ENLIVE)  237 mL Oral BID BM   feeding supplement (OSMOLITE 1.5 CAL)  1,000 mL Per Tube Q24H   feeding supplement (PRO-STAT SUGAR FREE 64)  30 mL Per Tube TID   heparin injection (subcutaneous)  5,000 Units Subcutaneous Q8H   levETIRAcetam  500 mg Per Tube BID   mouth rinse  15 mL Mouth Rinse q12n4p   metoprolol tartrate  50 mg Per Tube Q8H   sodium chloride flush  10-40 mL Intracatheter Q12H   Continuous Infusions:  sodium chloride 40 mL/hr at 08/28/19 0315   sodium chloride Stopped (08/21/19 2124)   sodium chloride Stopped (08/23/19 0945)   ceFEPime (MAXIPIME) IV 2 g (08/30/19 1100)   vancomycin 1,250 mg (08/30/19 1356)     LOS: 42 days    Time spent: 40 minutes    Irine Seal, MD [facility]  If 7PM-7AM, please contact night-coverage [email or web address] 08/30/2019, 5:10 PM

## 2019-08-30 NOTE — Progress Notes (Signed)
Occupational Therapy Treatment Patient Details Name: Matthew Black MRN: [id number] DOB: 07-01-1976 Today's Date: 08/30/2019    History of present illness Pt is a 43 y.o. M with no known PMH who presents on 8/25 after being found down at work, nonverbal with L hemiparesis. CT head showing large R hemisphere infarct with edema and mild mass effect. 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 Stable trace midline shift. S/p hemicraniectomy for  edema with abdominal flap implant. BLE DVT vasc US 8/28: acute DVT bilateral lower extremities, s/p IVC placement.  On 9/21 Repeat CT stable large hemorrhagic conversion.   OT comments  Pt lethargic/ flat throughout session but became more aroused as session progressed. MAX A +2 to roll in supine to place MAXI pad; maxi move transfer to TIS. Pt able to wash face with RUE with initial hand over hand assist and tactile cues to initiate task. MOD A overall for completion of task. DC plan remains appropriate. Will continue to follow acutely for OT needs.    Follow Up Recommendations  SNF;Supervision/Assistance - 24 hour    Equipment Recommendations  Other (comment)(defer to next venue)    Recommendations for Other Services      Precautions / Restrictions Precautions Precautions: Fall Precaution Comments: L hemiparesis, R skull missing, abd bone flap Required Braces or Orthoses: Other Brace Other Brace: helmet (not wearing due to presses on edema on R side of head) Restrictions Weight Bearing Restrictions: No       Mobility Bed Mobility Overal bed mobility: Needs Assistance Bed Mobility: Rolling Rolling: Max assist;+2 for physical assistance         General bed mobility comments: MAX A +2 to roll to place maxi pad  Transfers Overall transfer level: Needs assistance               General transfer comment: MAXi move transfer to TIS    Balance                                            ADL either performed or assessed with clinical judgement   ADL Overall ADL's : Needs assistance/impaired     Grooming: Wash/dry face;Sitting;Moderate assistance Grooming Details (indicate cue type and reason): tactile cues and hand over hand assist to initiate task with RUE                             Functional mobility during ADLs: Total assistance;+2 for physical assistance;+2 for safety/equipment(Maxi move to chair) General ADL Comments: session focus on functional mobility and seated ADLS in TIS to attempt to increase level of arousal     Vision       Perception     Praxis      Cognition Arousal/Alertness: Lethargic(became slightly more alert once transfered to TIS) Behavior During Therapy: Flat affect Overall Cognitive Status: Impaired/Different from baseline Area of Impairment: Following commands;Problem solving;Attention                   Current Attention Level: Sustained   Following Commands: Follows one step commands with increased time     Problem Solving: Slow processing;Decreased initiation;Requires verbal cues;Difficulty sequencing;Requires tactile cues          Exercises     Shoulder Instructions       General  Comments      Pertinent Vitals/ Pain       Pain Assessment: No/denies pain  Home Living                                          Prior Functioning/Environment              Frequency  Min 2X/week        Progress Toward Goals  OT Goals(current goals can now be found in the care plan section)  Progress towards OT goals: Progressing toward goals  Acute Rehab OT Goals Patient Stated Goal: unable OT Goal Formulation: Patient unable to participate in goal setting Time For Goal Achievement: 08/31/19 Potential to Achieve Goals: [place] Discharge plan remains appropriate;Frequency remains appropriate    Co-evaluation    PT/OT/SLP Co-Evaluation/Treatment: Yes Reason for  Co-Treatment: Complexity of the patient's impairments (multi-system involvement);Necessary to address cognition/behavior during functional activity;For patient/therapist safety;To address functional/ADL transfers   OT goals addressed during session: ADL's and self-care      AM-PAC OT "6 Clicks" Daily Activity     Outcome Measure   Help from another person eating meals?: Total Help from another person taking care of personal grooming?: Total Help from another person toileting, which includes using toliet, bedpan, or urinal?: Total Help from another person bathing (including washing, rinsing, drying)?: Total Help from another person to put on and taking off regular upper body clothing?: Total Help from another person to put on and taking off regular lower body clothing?: Total 6 Click Score: 6    End of Session    OT Visit Diagnosis: Unsteadiness on feet (R26.81);Muscle weakness (generalized) (M62.81);Hemiplegia and hemiparesis Hemiplegia - Right/Left: Left Hemiplegia - dominant/non-dominant: Non-Dominant Hemiplegia - caused by: Nontraumatic intracerebral hemorrhage   Activity Tolerance Patient tolerated treatment well   Patient Left Other (comment);with call bell/phone within reach(In TIS w/c)   Nurse Communication Mobility status        Time: 1335-1410 OT Time Calculation (min): 35 min  Charges: OT General Charges $OT Visit: 1 Visit OT Treatments $Therapeutic Activity: 23-37 mins Aileen Pilot, [place] [facility] [place] 08/30/2019, 4:39 PM

## 2019-08-30 NOTE — TOC Progression Note (Signed)
Transition of Care [facility]) - Progression Note    Patient Details  Name: Matthew Black MRN: [id number] Date of Birth: 1975-12-27  [facility] CM/SW [place], [place] Phone Number: 08/30/2019, 11:53 AM  Clinical Narrative:     CSW called and spoke with the patient's wife, Melene Muller. Patient's wife is very hopeful for CIR,  but was agreeable to send out referrals for possible SNF placement.   Thurmond Butts, MSW, [facility] Clinical Social Worker [phone]   Expected Discharge Plan: Skilled Nursing Facility Barriers to Discharge: Insurance Authorization  Expected Discharge Plan and Services Expected Discharge Plan: [place] In-house Referral: Development worker, community Discharge Planning Services: CM Consult Post Acute Care Choice: [place] Living arrangements for the past 2 months: Single Family Home                 DME Arranged: N/A DME Agency: NA       HH Arranged: NA HH Agency: NA         Social Determinants of Health (SDOH) Interventions    Readmission Risk Interventions No flowsheet data found.

## 2019-08-30 NOTE — Progress Notes (Addendum)
Inpatient Rehabilitation Admissions Coordinator  I contacted SW, Caren Griffins, that pt is not at a level to be a candidate for an inpt rehab admit. We recommend SNF.  Danne Baxter, RN, MSN Rehab Admissions Coordinator [phone] 08/30/2019 2:37 PM

## 2019-08-30 NOTE — Progress Notes (Signed)
STROKE TEAM PROGRESS NOTE   INTERVAL HISTORY Pt lying in bed, awake alert, eyes open. Neuro stable. He ate 100% of the breakfast. Currently on nocturnal TF. If he eats well till tomorrow, may consider to d/c TF.   Vitals:   08/30/19 0400 08/30/19 0600 08/30/19 0800 08/30/19 0827  BP: (!) 142/99 (!) 134/95 (!) 138/94   Pulse: (!) 105 (!) 108 (!) 105 (!) 103  Resp: '18 20 20 14  ' Temp: 98.7 F (37.1 C)  98.7 F (37.1 C)   TempSrc: Oral  Oral   SpO2: 100% 99% 91% 99%  Weight:      Height:        CBC:  Recent Labs  Lab 08/26/19 0515 08/27/19 0430 08/29/19 0706  WBC 8.1 7.9 7.0  NEUTROABS 5.4  --   --   HGB 10.4* 11.0* 11.6*  HCT 32.3* 34.2* 34.7*  MCV 85.9 86.4 85.3  PLT 287 299 546    Basic Metabolic Panel:  Recent Labs  Lab 08/27/19 0430 08/29/19 0706  NA 135 137  K 3.6 3.6  CL 102 101  CO2 24 26  GLUCOSE 117* 127*  BUN 13 14  CREATININE 0.65 0.64  CALCIUM 8.7* 9.1    IMAGING past 24h No results found.   PHYSICAL EXAM   General- Well nourished, well developed, middle-aged African-American male. He has right hemicraniectomy surgical incision. Still has cortrak in place  Ophthalmologic- fundi not visualized due to noncooperation.  Cardiovascular - Regular rhythm and rate.  Neuro - patient is awake, eyes open.  Nonverbal,  following commands on the right side and moves right upper and lower extremity purposefully to command. However, did not follow central commands. Eyes in right gaze position, does not cross midline, not blinking to visual threat to the left, PERRL. Left facial droop. Tongue protrusion not corporative. Has spontaneous movement of RUE against gravity and RLE in bed 3/5 and moving spontaneously. On pain stimulation, mild withdraw of LLE, but no movement of LUE. DTR 1+ and toes equiv. Sensation, coordination and gait not tested.   ASSESSMENT/PLAN [name] is a 43 y.o. male with no significant past medical history found down x 2  days nonverbal with L hemiparesis.   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 Trace L midline shift. ELVO at R ICA, R A1, R MCA.  MRI  Large R brain infarct sparing R PCA. Cytotoxic edema but no hemorrhage. Stable trace midline shift.  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 R ICA terminus, R ACA, R MCA occluded.   CT head similar w/ low density and sweddling R ACA and MCA territories, now with 5-74m midline shift. no hemorrhage  CT head acute R ACA and MCA infarct w/ progressive swelling decompressed through craniectomy defect.   CTA head and neck stable MLS. R ICA occlusion in neck that continues into R ACA and MCA. Mild petechial hemorrhage at basal ganglia  CT Head 8/29 - Unchanged appearance of massive right MCA and ACA territory infarcts with parenchyma extending through decompressive craniectomy. Small focus of suspected hemorrhage adjacent to the right caudate head.  CT head 07/30/19 - New hematoma within the  infarcted tissue   CT head 08/01/2019 stable w/o change  CT head 08/11/19 - new patchy hemorrhage along periphery of infarcted R frontoparietal lobe (likely HT) decreased dominant anterior margin hemorrhage. Stable herniation of infarcted brain through crani site.   CT head 9/20 - worsening with increased HT on the right and new IVH  CT head 08/15/19 - slightly worsened HT from previous.   CT head 9/22 hemorrhagic R ACA and MCA territory infarcts with edematous brain bulging through Crani defect.  No change x2 days  CT head 9/24 stable hematoma, increased right hemisphere mass-effect, external herniation from Crani site, making right lateral ventriculomegaly than prior  images.  CT head 9/26   MRI brain 9/26 R brain infarct. Decompressive crani w/ herniation of brain into site. Overlying subgaleal fluid collection on the R. Large irreg fluid collection antioerio marigin of crani->abscess. Large hemorrhage into R temporoparietal infarct. Mild ventricular enlargement.  2D Echo EF 60-65%  2D echo w/ bubble ordered no LRS   LE venous doppler DVT in R peroneal, L popliteal, L posterior tibial and L peroneal  LE venous doppler repeat DVT in R peroneal, L popliteal, L posterior tibial and L peroneal  TCD bubble no HITS, no PFO  LDL 83  HgbA1c 5.4  UDS positive THC  Hypercoagulable and autoimmune work up negative  Add Heparin 5000 units sq tid for VTE prophylaxis  No antithrombotic prior to admission, treated DVT with heparin IV, then  discontinued due to hemorrhagic conversion with right frontal HT, then resumed heparin IV given extensive DVT and PE, heparin IV again discontinued due to worsening HT  Therapy recommendations:  SNF (reassessed by CIR and declined again 9/16)  Disposition:  Pending  Palliative Care consulted met with wife 9/29 - wife wants full aggressive care   Right ICA occlusion  MRA and CTA showed right ICA occlusion from origin to terminal  Wife denies any head trauma  Wife stated that pt had recent aggressive exercise with weight lifting  Concerning dissection as working diagnosis of stroke etiology   Cyctotoxic cerebral edema  S/p R decompressive hemicraniectomy (Nundkumar) w/ flap R abd 07/19/2019  PICC placed - keep for now per Leonie Man  Given One dose of 23.4% 8/26; 3%  off 8/30 1700  CT repeat 07/30/19 - New parenchymal hemorrhage within the infarcted tissue  Off helmet to further release pressure  CT repeat 08/01/2019 stable w/o change  CT head repeat 08/11/19 - new patchy hemorrhage along periphery of infarcted R frontoparietal lobe (likely HT) but previous right frontal hematoma now much reduced in size  CT  head 9/20 worsening right HT with new ICH and IVH  CT Head 9/21 - slightly worsened HT from previous.   CT head 9/22 hemorrhagic R ACA and MCA territory infarcts with edematous brain bulging through Crani defect.  No change x2 days  CT head 9/24 stable hematoma, increased right hemisphere mass-effect, external herniation from Crani site, making right lateral ventriculomegaly than prior images  Liquefactive necrosis with absorption fever - less likely cerebral abscess or empyema  CT 08/20/19 - Irregular collections about the craniectomy site with question of disruption of the underlying dura, which could reflect sequelae of underlying CSF leak. Correlation with physical exam and fluid sampling suggested for further evaluation.  MRI W&WO - 08/20/19 - external herniation of brain into the craniectomy site. Large irregularly enhancing fluid collection along the anterior margin of the craniectomy measuring approximately 5.4 x 9.8 cm. This may represent a large abscess, sterile  hematoma versus infected hematoma.   Dr Kathyrn Sheriff aspirated 9/27 and sent for cx. Not a surgical candidate d/t  increasing herniation.  Cx moderate WBC, no organisms - no growth 3 days  Tmax - 100.5->afebrile  WBC - 8.1->7.9->7.0  ID on board   Maxipime, Vancomycin 9/26>> (plan tx x 8 weeks)  Repeat MRI prior to stopping therapy after 8 weeks of vancomycin/cefepime  Notify ID once pt close to d/c  Blood cultures - neg, repeated again 08/04/19 -> no growth on empiric unasyn -> changed to vanco, cefepime and flagyl 08/05/19->now off  Lactic acid 1.2->2.0  Hepatitis panel 9/12 - neg  procalcitonin and lactic acid normal   B LE DVT Small LLL PE  LE venous doppler DVT in R peroneal, L popliteal, L posterior tibial and L peroneal  Etiology unclear  Started IV heparin 07/26/2019  IVC filter placed 8/27. Plan retrieval in 8-12 weeks as an IP in an IR clinic  Hypercoagulable work up negative   LE venous doppler  08/02/19 repeat extensive DVT in R peroneal, L popliteal, L posterior tibial and L peroneal up to the IVC filter  CT chest Nonocclusive thrombus seen within the left posterior lower lobe segmental artery.   Treated with IV Heparin but then off secondary to intracranial hemorrhage, then restarted IV heparin given extensive DVT and PE but again stopped due to worsening HT on 9/20  Hematology consult, appreciate help - hypercoag w/u neg.  At risk for recurrent clots   Factor V Leiden, homocysteine, Prot C&S, Prothombin gene mutation all neg.   Abdominal wall hematoma  Large fluid collection seen on CT abdomen  CT abd/pel concerning for abdominal wall fluid collection likely old hematoma  Dr. Kathyrn Sheriff did fluid aspiration and seems to be old blood component  Fluid aspiration sent for culture 9/11 - no growth < 24 hrs  Repeat ABD [place] complex seroma vs liquified hematoma R abd similar to previous  Seizure-like activity  EEG continuous slowing, excessive beta activity, sleep spindle asymmetry decreased R->related to stroke and sedation. No SZ  Long Term EEG cortical dysfunction in right frontal region  Seizure precautions  Continue Keppra   UTI, resolved  Urine culture pseudomonas - treated with  to zosyn  UA 9/21 - WBC 0-5 negative   UA 9/23 - WBC 11-20  Repeat B Cx no growth  Tachycardia, resolved  HR normal now  Likely due to fever  Hydration with IVF, TF and free water  On metoprolol to 50 Q8h  EKG ST, rate 120 w/ possible LA enlargement  Troponin series neg x 3    Hyperlipidemia  Home meds:  no statin  LDL 83, goal < 70  Was on lipitor 20 mg daily   D/c statin due to elevated LFT  Consider statin at d/c if LFTs down  Elevated LFT, almost resolved  AST - 105-118-98-85-38-36  ALT - 80-68-65-164-64-53  CK [phone] [phone]  Hepatitis panel 9/12 - neg  Dysphagia . Secondary to stroke  IVF @ 40cc total  Cleared for Dysphagia 1 nectar  thick liquids 10/1  on TF @ 80 with nocturnal feeding  Speech on board  Good po intake today, if continue to eat well, may consider stop TF tomorrow  Other Stroke Risk Factors  Former Cigarette smoker, quit 3 yrs ago  ETOH use  Substance abuse UDS - positive for THC   Other Active Problems  Hypokalemia 3.2 - replaced   Normocytic anemia - hematology felt d/t ongoing blood draws Hgb  10.4->11.0->11.6  Hospital day # 30  Rosalin Hawking, MD PhD Stroke Neurology 08/30/2019 8:58 AM   To contact Stroke Continuity provider, please refer to [email or web address] After hours, contact General Neurology

## 2019-08-30 NOTE — Progress Notes (Signed)
Physical Therapy Treatment Patient Details Name: Matthew Black MRN: [id number] DOB: [birth year] Today's Date: 08/30/2019    History of Present Illness Pt is a 43 y.o. M with no known PMH who presents on 8/25 after being found down at work, nonverbal with L hemiparesis. CT head showing large R hemisphere infarct with edema and mild mass effect. 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 Stable trace midline shift. S/p hemicraniectomy for  edema with abdominal flap implant. BLE DVT vasc US 8/28: acute DVT bilateral lower extremities, s/p IVC placement.  On 9/21 Repeat CT stable large hemorrhagic conversion.    PT Comments    Patient tolerated up OOB for 3 hours today in tilt in space w/c with better head/neck positioning and safer in keeping hips back in chair and not sliding out.  Able to demonstrate increased level of arousal, attention and participated with OT in simple ADL grooming task.  CIR coordinator continues to feel [name] is needing longer term rehab so appropriate for SNF level rehab.    Follow Up Recommendations  SNF     Equipment Recommendations  Wheelchair (measurements PT);Wheelchair cushion (measurements PT);Hospital bed;Other (comment)    Recommendations for Other Services       Precautions / Restrictions Precautions Precautions: Fall Precaution Comments: L hemiparesis, R skull missing, abd bone flap Required Braces or Orthoses: Other Brace Other Brace: helmet (not wearing due to presses on edema on R side of head) Restrictions Weight Bearing Restrictions: No    Mobility  Bed Mobility Overal bed mobility: Needs Assistance Bed Mobility: Rolling Rolling: Max assist;+2 for physical assistance         General bed mobility comments: MAX A +2 to roll to place maximove lift pad  Transfers Overall transfer level: Needs assistance               General transfer comment: MAXi move transfer to TIS w/c  Ambulation/Gait                  Stairs             Wheelchair Mobility    Modified Rankin (Stroke Patients Only) Modified Rankin (Stroke Patients Only) Pre-Morbid Rankin Score: No symptoms Modified Rankin: Severe disability     Balance Overall balance assessment: Needs assistance   Sitting balance-Leahy Scale: Poor Sitting balance - Comments: positioned in TIS w/c for positioning for safety for increased OOB time; adjusted head rest and tilted back with feel down on footrests (used gait belt due to R legrest not locking) Postural control: Left lateral lean                                  Cognition Arousal/Alertness: Awake/alert Behavior During Therapy: Flat affect Overall Cognitive Status: Impaired/Different from baseline Area of Impairment: Attention;Safety/judgement;Following commands;Problem solving                   Current Attention Level: Sustained   Following Commands: Follows one step commands inconsistently;Follows one step commands with increased time Safety/Judgement: Decreased awareness of deficits   Problem Solving: Slow processing;Decreased initiation;Requires verbal cues;Requires tactile cues        Exercises Other Exercises Other Exercises: Stretching to L LE and UE seated in w/c.    General Comments General comments (skin integrity, edema, etc.): Pushed pt in w/c out to hallway to look out big windows for increased stimulation and  increased level of arousal during stretching activities. Wife arrived and pt more alert and greeted her with eye contact, shaking his head in response to her questions and squeezing her hand.      Pertinent Vitals/Pain Pain Assessment: No/denies pain Faces Pain Scale: No hurt    Home Living                      Prior Function            PT Goals (current goals can now be found in the care plan section) Acute Rehab PT Goals Patient Stated Goal: unable Progress towards PT goals: Progressing  toward goals    Frequency    Min 3X/week      PT Plan Discharge plan needs to be updated    Co-evaluation PT/OT/SLP Co-Evaluation/Treatment: Yes Reason for Co-Treatment: Complexity of the patient's impairments (multi-system involvement);Necessary to address cognition/behavior during functional activity;For patient/therapist safety;To address functional/ADL transfers PT goals addressed during session: Mobility/safety with mobility;Proper use of DME;Strengthening/ROM OT goals addressed during session: ADL's and self-care      AM-PAC PT "6 Clicks" Mobility   Outcome Measure  Help needed turning from your back to your side while in a flat bed without using bedrails?: Total Help needed moving from lying on your back to sitting on the side of a flat bed without using bedrails?: Total Help needed moving to and from a bed to a chair (including a wheelchair)?: Total Help needed standing up from a chair using your arms (e.g., wheelchair or bedside chair)?: Total Help needed to walk in hospital room?: Total Help needed climbing 3-5 steps with a railing? : Total 6 Click Score: 6    End of Session Equipment Utilized During Treatment: Other (comment)(maximove) Activity Tolerance: Patient tolerated treatment well Patient left: in chair;with call bell/phone within reach;with family/visitor present;with chair alarm set(belt alarm)   PT Visit Diagnosis: Hemiplegia and hemiparesis;Other symptoms and signs involving the nervous system (R29.898);Other abnormalities of gait and mobility (R26.89) Hemiplegia - Right/Left: Left Hemiplegia - dominant/non-dominant: Non-dominant Hemiplegia - caused by: Cerebral infarction     Time: 1335-1420 PT Time Calculation (min) (ACUTE ONLY): 45 min  Charges:  $Therapeutic Exercise: 8-22 mins $Therapeutic Activity: 8-22 mins                     [name], [place] [facility] [phone] 08/30/2019    [name] 08/30/2019, 5:36  PM

## 2019-08-31 LAB — BASIC METABOLIC PANEL
Anion gap: 10 (ref 5–15)
BUN: 12 mg/dL (ref 6–20)
CO2: 25 mmol/L (ref 22–32)
Calcium: 8.8 mg/dL — ABNORMAL LOW (ref 8.9–10.3)
Chloride: 102 mmol/L (ref 98–111)
Creatinine, Ser: 0.56 mg/dL — ABNORMAL LOW (ref 0.61–1.24)
GFR calc Af Amer: >60 mL/min (ref >60)
GFR calc non Af Amer: >60 mL/min (ref >60)
Glucose, Bld: 133 mg/dL — ABNORMAL HIGH (ref 70–99)
Potassium: 3.5 mmol/L (ref 3.5–5.1)
Sodium: 137 mmol/L (ref 135–145)

## 2019-08-31 LAB — CBC
HCT: 35.4 % — ABNORMAL LOW (ref 39.0–52.0)
Hemoglobin: 11.8 g/dL — ABNORMAL LOW (ref 13.0–17.0)
MCH: 28.8 pg (ref 26.0–34.0)
MCHC: 33.3 g/dL (ref 30.0–36.0)
MCV: 86.3 fL (ref 80.0–100.0)
Platelets: 285 10*3/uL (ref 150–400)
RBC: 4.1 MIL/uL — ABNORMAL LOW (ref 4.22–5.81)
RDW: 15.2 % (ref 11.5–15.5)
WBC: 6.1 10*3/uL (ref 4.0–10.5)
nRBC: 0 % (ref 0.0–0.2)

## 2019-08-31 LAB — VANCOMYCIN, TROUGH: Vancomycin Tr: 12 ug/mL — ABNORMAL LOW (ref 15–20)

## 2019-08-31 LAB — GLUCOSE, CAPILLARY
Glucose-Capillary: 105 mg/dL — ABNORMAL HIGH (ref 70–99)
Glucose-Capillary: 111 mg/dL — ABNORMAL HIGH (ref 70–99)
Glucose-Capillary: 95 mg/dL (ref 70–99)
Glucose-Capillary: 111 mg/dL — ABNORMAL HIGH (ref 70–99)
Glucose-Capillary: 86 mg/dL (ref 70–99)

## 2019-08-31 MED ORDER — OSMOLITE 1.5 CAL PO LIQD
400.0000 mL | ORAL | Status: DC
  Administered 2019-08-31 – 2019-09-01 (×2): 400 mL
  Filled 2019-08-31 (×2): qty 474

## 2019-08-31 MED ORDER — VANCOMYCIN HCL 10 G IV SOLR
1500.0000 mg | Freq: Three times a day (TID) | INTRAVENOUS | Status: DC
  Administered 2019-08-31 – 2019-09-06 (×18): 1500 mg via INTRAVENOUS
  Filled 2019-08-31 (×21): qty 1500

## 2019-08-31 NOTE — Progress Notes (Signed)
Pharmacy Antibiotic Note  Matthew Black is a 43 y.o. 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  Pharmacy has been consulted for Vancomycin dosing.  Given the patient's abscess is in the epidural space and there is mention of cerebritis we will target a trough of 15-20. The patient had a trough today that came back as 12 on 1250 mg q8h hours His Scr is stable and UOP is good.  Plan: Change Vancomycin to 1500 mg IV q8h  Continue Cefepime 2 gms IV q8hr Monitor renal function, clinical status, vanc levels  Height: 5\' 11"  (180.3 cm) Weight: 202 lb 9.6 oz (91.9 kg) IBW/kg (Calculated) : 75.3  Temp (24hrs), Avg:98.7 F (37.1 C), Min:98.3 F (36.8 C), Max:99.1 F (37.3 C)  Recent Labs  Lab 08/25/19 0524 08/25/19 1036 08/26/19 0515 08/27/19 0430 08/29/19 0706 08/31/19 0500 08/31/19 0530  WBC 8.3  --  8.1 7.9 7.0 6.1  --   CREATININE 0.63  --  0.61 0.65 0.64 0.56*  --   VANCOTROUGH 14*  --   --   --   --   --  12*  VANCOPEAK  --  14*  --   --   --   --   --     Estimated Creatinine Clearance: 140.8 mL/min (A) (by C-G formula based on SCr of 0.56 mg/dL (L)).    Allergies  Allergen Reactions  . Hydrocodone Nausea Only    Antimicrobials this admission: Zosyn 9/21>>9/26 Vanc 9/26 >> (11/20) Cefepime 9/26 >> (11/20)  Thank you for allowing pharmacy to be a part of this patient's care.  Salome Arnt, PharmD, BCPS Please see AMION for all pharmacy numbers 08/31/2019 7:16 AM

## 2019-08-31 NOTE — Progress Notes (Signed)
STROKE TEAM PROGRESS NOTE   INTERVAL HISTORY Pt lying in bed, no neuro changes. This morning he only ate 30% of breakfast, but lunch he ate 95%. Will decrease his TF at night to 50cc x 8h, if he continues to eat well, will d/c TF tomorrow.    Vitals:   08/31/19 0200 08/31/19 0340 08/31/19 0749 08/31/19 0847  BP: (!) 132/93     Pulse: 95   99  Resp: 14   19  Temp:  99.1 F (37.3 C) 98.6 F (37 C)   TempSrc:  Oral Axillary   SpO2: 99%   98%  Weight:      Height:        CBC:  Recent Labs  Lab 08/26/19 0515  08/29/19 0706 08/31/19 0500  WBC 8.1   < > 7.0 6.1  NEUTROABS 5.4  --   --   --   HGB 10.4*   < > 11.6* 11.8*  HCT 32.3*   < > 34.7* 35.4*  MCV 85.9   < > 85.3 86.3  PLT 287   < > 272 285   < > = values in this interval not displayed.    Basic Metabolic Panel:  Recent Labs  Lab 08/29/19 0706 08/31/19 0500  NA 137 137  K 3.6 3.5  CL 101 102  CO2 26 25  GLUCOSE 127* 133*  BUN 14 12  CREATININE 0.64 0.56*  CALCIUM 9.1 8.8*    IMAGING past 24h No results found.   PHYSICAL EXAM    General- Well nourished, well developed, middle-aged African-American male. He has right hemicraniectomy surgical incision. Still has cortrak in place  Ophthalmologic- fundi not visualized due to noncooperation.  Cardiovascular - Regular rhythm and rate.  Neuro - patient is awake, eyes open.  Nonverbal,  following commands on the right side and moves right upper and lower extremity purposefully to command. However, did not follow central commands. Eyes in right gaze position, does not cross midline, not blinking to visual threat to the left, PERRL. Left facial droop. Tongue protrusion not corporative. Has spontaneous movement of RUE against gravity and RLE in bed 3/5 and moving spontaneously. On pain stimulation, mild withdraw of LLE, but no movement of LUE. DTR 1+ and toes equiv. Sensation, coordination and gait not tested.   ASSESSMENT/PLAN [name] is a 43 y.o.  male with no significant past medical history found down x 2 days nonverbal with L hemiparesis.   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 Trace L midline shift. ELVO at R ICA, R A1, R MCA.  MRI  Large R brain infarct sparing R PCA. Cytotoxic edema but no hemorrhage. Stable trace midline shift.  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 R ICA terminus, R ACA, R MCA occluded.   CT head similar w/ low density and sweddling R ACA and MCA territories, now with 5-74m midline shift. no hemorrhage  CT head acute R ACA and MCA infarct w/ progressive swelling decompressed through craniectomy defect.   CTA head and neck stable MLS. R ICA occlusion in neck that continues into R ACA and MCA. Mild petechial hemorrhage at basal ganglia  CT Head 8/29 - Unchanged appearance of massive right MCA and ACA territory infarcts with parenchyma extending through decompressive craniectomy. Small focus of suspected hemorrhage adjacent to the right caudate head.  CT head 07/30/19 - New hematoma within the infarcted tissue   CT head 08/01/2019 stable w/o change  CT head 08/11/19 - new patchy hemorrhage along periphery of infarcted R frontoparietal lobe (likely HT) decreased dominant anterior margin hemorrhage. Stable herniation of infarcted brain through crani site.   CT head 9/20 - worsening with increased HT on the right and new IVH  CT head 08/15/19 - slightly worsened HT from previous.   CT head 9/22 hemorrhagic R ACA and MCA territory infarcts with edematous brain bulging through Crani defect.  No change x2 days  CT head 9/24 stable hematoma, increased right hemisphere mass-effect, external herniation from  Crani site, making right lateral ventriculomegaly than prior images.  CT head 9/26   MRI brain 9/26 R brain infarct. Decompressive crani w/ herniation of brain into site. Overlying subgaleal fluid collection on the R. Large irreg fluid collection antioerio marigin of crani->abscess. Large hemorrhage into R temporoparietal infarct. Mild ventricular enlargement.  2D Echo EF 60-65%  2D echo w/ bubble ordered no LRS   LE venous doppler DVT in R peroneal, L popliteal, L posterior tibial and L peroneal  LE venous doppler repeat DVT in R peroneal, L popliteal, L posterior tibial and L peroneal  TCD bubble no HITS, no PFO  LDL 83  HgbA1c 5.4  UDS positive THC  Hypercoagulable and autoimmune work up negative  Add Heparin 5000 units sq tid for VTE prophylaxis  No antithrombotic prior to admission, treated DVT with heparin IV, then  discontinued due to hemorrhagic conversion with right frontal HT, then resumed heparin IV given extensive DVT and PE, heparin IV again discontinued due to worsening HT  Therapy recommendations:  SNF (reassessed by CIR and declined 9/16 & 10/6)  Disposition:  Pending  Palliative Care consulted met with wife 9/29 - wife wants full aggressive care   Right ICA occlusion  MRA and CTA showed right ICA occlusion from origin to terminal  Wife denies any head trauma  Wife stated that pt had recent aggressive exercise with weight lifting  Concerning dissection as working diagnosis of stroke etiology   Cyctotoxic cerebral edema  S/p R decompressive hemicraniectomy (Nundkumar) w/ flap R abd 07/19/2019  PICC placed - keep for now per Leonie Man  Given One dose of 23.4% 8/26; 3%  off 8/30 1700  CT repeat 07/30/19 - New parenchymal hemorrhage within the infarcted tissue  Off helmet to further release pressure  CT repeat 08/01/2019 stable w/o change  CT head repeat 08/11/19 - new patchy hemorrhage along periphery of infarcted R frontoparietal lobe (likely HT) but  previous right frontal hematoma now much reduced in size  CT head 9/20 worsening right HT with new ICH and IVH  CT Head 9/21 - slightly worsened HT from previous.   CT head 9/22 hemorrhagic R ACA and MCA territory infarcts with edematous brain bulging through Crani defect.  No change x2 days  CT head 9/24 stable hematoma, increased right hemisphere mass-effect, external herniation from Crani site, making right lateral ventriculomegaly than prior images  Liquefactive necrosis with absorption fever - less likely cerebral abscess or empyema  CT 08/20/19 - Irregular collections about the craniectomy site with question of disruption of the underlying dura, which could reflect sequelae of underlying CSF leak. Correlation with physical exam and fluid sampling suggested for  further evaluation.  MRI W&WO - 08/20/19 - external herniation of brain into the craniectomy site. Large irregularly enhancing fluid collection along the anterior margin of the craniectomy measuring approximately 5.4 x 9.8 cm. This may represent a large abscess, sterile hematoma versus infected hematoma.   Dr Kathyrn Sheriff aspirated 9/27 and sent for cx. Not a surgical candidate d/t  increasing herniation.  Cx moderate WBC, no organisms - no growth 3 days  Tmax - 100.5->afebrile  WBC - 8.1->7.9->7.0->6.1  ID on board   Maxipime, Vancomycin 9/26>> (plan tx x 8 weeks)  Repeat MRI prior to stopping therapy after 8 weeks of vancomycin/cefepime  Notify ID once pt close to d/c  Blood cultures - neg, repeated again 08/04/19 -> no growth on empiric unasyn -> changed to vanco, cefepime and flagyl 08/05/19->now off  Lactic acid 1.2->2.0  Hepatitis panel 9/12 - neg  procalcitonin and lactic acid normal   B LE DVT Small LLL PE  LE venous doppler DVT in R peroneal, L popliteal, L posterior tibial and L peroneal  Etiology unclear  Started IV heparin 07/26/2019  IVC filter placed 8/27. Plan retrieval in 8-12 weeks as an IP in an  IR clinic  Hypercoagulable work up negative   LE venous doppler 08/02/19 repeat extensive DVT in R peroneal, L popliteal, L posterior tibial and L peroneal up to the IVC filter  CT chest Nonocclusive thrombus seen within the left posterior lower lobe segmental artery.   Treated with IV Heparin but then off secondary to intracranial hemorrhage, then restarted IV heparin given extensive DVT and PE but again stopped due to worsening HT on 9/20  Hematology consult, appreciate help - hypercoag w/u neg.  At risk for recurrent clots   Factor V Leiden, homocysteine, Prot C&S, Prothombin gene mutation all neg.   Abdominal wall hematoma  Large fluid collection seen on CT abdomen  CT abd/pel concerning for abdominal wall fluid collection likely old hematoma  Dr. Kathyrn Sheriff did fluid aspiration and seems to be old blood component  Fluid aspiration sent for culture 9/11 - no growth < 24 hrs  Repeat ABD [place] complex seroma vs liquified hematoma R abd similar to previous  Seizure-like activity  EEG continuous slowing, excessive beta activity, sleep spindle asymmetry decreased R->related to stroke and sedation. No SZ  Long Term EEG cortical dysfunction in right frontal region  Seizure precautions  Continue Keppra   UTI, resolved  Urine culture pseudomonas - treated with  to zosyn  UA 9/21 - WBC 0-5 negative   UA 9/23 - WBC 11-20  Repeat B Cx no growth  Tachycardia, resolved  HR normal now  Likely due to fever  Hydration with IVF, TF and free water  On metoprolol to 50 Q8h  EKG ST, rate 120 w/ possible LA enlargement  Troponin series neg x 3    Hyperlipidemia  Home meds:  no statin  LDL 83, goal < 70  Was on lipitor 20 mg daily   D/c statin due to elevated LFT  Consider statin at d/c if LFTs down  Elevated LFT, almost resolved  AST - 105-118-98-85-38-36  ALT - 80-68-65-164-64-53  CK 2063-852 [phone]  Hepatitis panel 9/12 -  neg  Dysphagia . Secondary to stroke  IVF @ 40cc total  Cleared for Dysphagia 1 nectar thick liquids 10/1  Speech on board  Good po intake today, decrease TF to 50cc x 8hs tonight, if continue to eat well, will d/c TF tomorrow    Other Stroke Risk Factors  Former Cigarette smoker, quit 3 yrs ago  ETOH use  Substance abuse UDS - positive for THC   Other Active Problems  Hypokalemia 3.2 - replaced - 3.6-3.5   Normocytic anemia - hematology felt d/t ongoing blood draws Hgb 10.4->11.0->11.6->11.8  Hospital day # 72  Rosalin Hawking, MD PhD Stroke Neurology 08/31/2019 10:53 AM   To contact Stroke Continuity provider, please refer to [email or web address] After hours, contact General Neurology

## 2019-08-31 NOTE — Progress Notes (Addendum)
PROGRESS NOTE    Matthew Black  [id number] DOB: 17-May-1976 DOA: 07/19/2019 PCP: Colon Branch, MD    Brief Narrative:43 year old gentleman with no significant past medical history who was found down x2 days nonverbal with left hemiparesis.  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  Patient status post hemicraniectomy with abdominal flap implant.  Patient subsequently underwent a hemorrhagic conversion with hematoma 07/30/2019 which worsened on 9/20 with right infarct area.  Patient was seen and followed by neurology in the ICU.  Patient subsequently underwent right decompressive hemicraniectomy by Dr. Kathyrn Sheriff with flap on 07/19/2019. 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  Patient underwent aspiration on 08/21/2019 per Dr. Kathyrn Sheriff and cultures sent.  Patient noted to be not a surgical candidate due to increasing herniation.  ID was consulted and patient started empirically on IV vancomycin IV cefepime. Patient also noted to have a bilateral lower extremity DVT and small left lower lobe PE per venous Dopplers and CT angiogram.  Heparin was started on 07/26/2019.  IVC filter placed 07/21/2019 with plan retrieval 8 to 12 weeks if patient still hospitalized or in the outpatient IR clinic.  Hypercoagulable work-up done was negative.  Oncology consulted.  Patient was on IV heparin however subsequently discontinued due to intracranial hemorrhage and then subsequently resumed given extensive DVT and PE but then subsequently discontinued due to worsening hemorrhage on 08/14/2019.  Patient also noted to have abdominal wall hematoma.  Patient also noted to  have some seizure-like activity subsequently placed on seizure precautions and on Keppra.  Patient status post treatment of Pseudomonas UTI with Zosyn.  Noted with dysphagia with speech therapy following.  Patient subsequently transferred to tried service on 08/25/2019 with neurology following.    Assessment & Plan:   Active Problems:   Stroke (cerebrum) (HCC)   Pressure injury of skin   Acute respiratory failure with hypoxemia (HCC)   Endotracheal tube present   Deep vein thrombosis (DVT) of non-extremity vein   Hypokalemia   FUO (fever of unknown origin)   Acute blood loss anemia   SIRS (systemic inflammatory response syndrome) (HCC)   Leukocytosis   Primary hypercoagulable state ([place])   Acute pulmonary embolism without acute cor pulmonale (HCC)   Altered mental status   Cerebral abscess   Urinary tract infection without hematuria     #1 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  Questionable etiology per neurology concerned that patient may have had a possible right ICA dissection from exercise.  Patient noted to have hemorrhagic conversion with hematoma 07/30/2019 with worsening on 08/14/2019 in the right infarct. Patient followed by neurology throughout the hospitalization.  Patient had head CT done that showed a large right brain infarct with edema right ACA and MCA territories.  MRI showed large right brain infarct with cytotoxic edema but no hemorrhage.  MRA head and neck showed right ICA occluded at origin.  Right ICA terminus, R ACA, R MCA occluded.  Head CT which was done showed 5 to 6 mm midline shift with no hemorrhage.  Patient underwent acute head CT that  showed progression with swelling decompressive craniectomy defect.  CTA head and neck was done that showed right ICA occlusion neck that continues into right ACA and MCA. CT head done 07/23/2019 showed unchanged appearance  of massive right MCA and ACA territory infarct with parenchyma extending through decompressive craniectomy.  Small focus of suspected hemorrhage adjacent to right caudate head. CT head done 07/30/2019 with new hematoma within infarcted tissue. CT head done 08/01/2019 with no significant change. CT head done 08/11/2019 with new patchy hemorrhage along periphery of infarcted right frontal parietal lobe, decreased dominant anterior margin hemorrhage.  Stable herniation of infarcted brain through craniectomy site. CT head done 08/14/2019 with worsening with increased hemorrhagic transformation on the right and new IVH. CT head 08/15/2019 slightly worsened hemorrhagic transformation from previous. CT head done 08/16/2019 with hemorrhagic right ACA and MCA territory infarct with edematous brain bulging through Crani defect. CT head 08/18/2019 with stable hematoma, increased right hemisphere mass-effect, external herniation from Crani site, with market right lateral ventriculomegaly than prior images. 2D echo done with a EF of 60 to 65% of emboli. 2D echo with bubble study ordered was negative for PFO. Lower extremity Dopplers done showed bilateral DVT. Patient was treated with IV heparin due to extensive DVT and PE but was discontinued due to worsening hemorrhagic transformation. Patient followed by palliative care and neurology PT OT and speech therapy. CIR declined.  #2 cytotoxic cerebral edema-Status post right decompressive hemicraniectomy per Dr. Kathyrn Sheriff with right flap 07/19/2019.  Patient has had multiple CT scans as a follow-up.  The last CT scan was done 08/18/2019 with stable hematoma increased right hemisphere mass-effect external herniation from the [place] site.  Neurology discussed with neurosurgery no further intervention planned at this time.   #3 cerebral abscess/empyema/cerebritis status post aspiration and cultures with no growth to date.  Due to increasing herniation patient was thought not to  be a surgical candidate.  Patient was started on Vanco and cefepime on 08/20/2019 with duration of therapy 6 to 8 weeks per infectious disease.  Patient will need follow-up CT scan prior to discontinuation of IV antibiotics.  Will need to notify ID close to discharge.  Will need outpatient follow-up with ID.  #4 bilateral lower extremity DVT and  small left lower lobe pulmonary embolism-patient treated with IV heparin which was subsequently stopped due to increasing thoracic transformation/intracranial hemorrhage.  Patient will need outpatient follow-up with hematology.  He also had an IVC filter placed 07/21/2019 with planned retrieval 8 to 12 weeks per IR.  This is going to be around end of October.  Reconsult IR for this.  #5?  Seizure-like activity continue Keppra EEG showed cortical dysfunction in the right frontal region  #6 abdominal wall hematoma status post aspiration no growth  #7 Pseudomonas UTI status post Zosyn  #8 dysphagia due to acute stroke followed by speech staff reports she is eating 75% or more.  Plan is to taper tube feeds so he would increase his appetite and eat better.       Nutrition Problem: Inadequate oral intake Etiology: acute illness     Signs/Symptoms: NPO status    Interventions: Tube feeding, Prostat, Ensure Enlive (each supplement provides 350kcal and 20 grams of protein)  Estimated body mass index is 28.26 kg/m as calculated from the following:   Height as of this encounter: 5\' 11"  (1.803 m).   Weight as of this encounter: 91.9 kg.  DVT prophylaxis: SCD Code Status: Full code  family Communication: None at bedside Disposition  Plan: SNF once patient is tolerating oral intake to please have been weaned off Consultants: Infectious disease, oncology, palliative care, CIR, neurosurgery, PCCM   Procedures:- CT head 08/20/2019, 08/18/2019, 08/16/2019, 08/14/2019, 08/11/2019, 08/01/2019, 07/30/2019, 07/23/2019 CT angios head and neck 07/21/2019 CT angiogram  chest 08/01/2019 CT abdomen and pelvis 08/04/2019 Abdominal ultrasound 08/11/2019 2D echo 07/20/2019,  2D echo bubble study 08/03/2019 Lower extremity Dopplers 07/21/2019, 08/02/2019  MRI head 08/20/2019, 07/19/2019 Modified barium swallow 08/25/2019 MRA head and neck 07/19/2019   Antimicrobials: Vanco cefepime started 08/20/2019 Zosyn 08/14/2021 08/20/2019 Flagyl 1 dose 08/22/2019  Subjective: Patient asleep did not get anything to sleep overnight nurse by the bedside it took some time for him to wake up he has not had breakfast yet did not follow any commands  Objective: Vitals:   08/31/19 0200 08/31/19 0340 08/31/19 0749 08/31/19 0847  BP: (!) 132/93     Pulse: 95   99  Resp: 14   19  Temp:  99.1 F (37.3 C) 98.6 F (37 C)   TempSrc:  Oral Axillary   SpO2: 99%   98%  Weight:      Height:        Intake/Output Summary (Last 24 hours) at 08/31/2019 1139 Last data filed at 08/31/2019 0920 Gross per 24 hour  Intake 270 ml  Output 3450 ml  Net -3180 ml   Filed Weights   08/24/19 0500 08/25/19 0500 08/26/19 0500  Weight: 92.4 kg 92.3 kg 91.9 kg    Examination:  General exam: Appears calm and comfortable  Respiratory system: Clear to auscultation. Respiratory effort normal. Cardiovascular system: S1 & S2 heard, RRR. No JVD, murmurs, rubs, gallops or clicks. No pedal edema. Gastrointestinal system: Abdomen is nondistended, soft and nontender.  Right lower quadrant hard palpable area noted secondary to hematoma Central nervous system left upper and lower extremity hemiparesis, moves right upper and lower extremities extremities: Symmetric 5 x 5 power. Skin: No rashes, lesions or ulcers    Data Reviewed: I have personally reviewed following labs and imaging studies  CBC: Recent Labs  Lab 08/25/19 0524 08/26/19 0515 08/27/19 0430 08/29/19 0706 08/31/19 0500  WBC 8.3 8.1 7.9 7.0 6.1  NEUTROABS  --  5.4  --   --   --   HGB 10.6* 10.4* 11.0* 11.6* 11.8*  HCT 32.8* 32.3* 34.2*  34.7* 35.4*  MCV 85.6 85.9 86.4 85.3 86.3  PLT 278 287 299 272 [id number]   Basic Metabolic Panel: Recent Labs  Lab 08/25/19 0524 08/26/19 0515 08/27/19 0430 08/29/19 0706 08/31/19 0500  NA 135 135 135 137 137  K 3.3* 3.5 3.6 3.6 3.5  CL 103 103 102 101 102  CO2 22 22 24 26 25   GLUCOSE 122* 107* 117* 127* 133*  BUN 14 13 13 14 12   CREATININE 0.63 0.61 0.65 0.64 0.56*  CALCIUM 8.6* 8.3* 8.7* 9.1 8.8*   GFR: Estimated Creatinine Clearance: 140.8 mL/min (A) (by C-G formula based on SCr of 0.56 mg/dL (L)). Liver Function Tests: No results for input(s): AST, ALT, ALKPHOS, BILITOT, PROT, ALBUMIN in the last 168 hours. No results for input(s): LIPASE, AMYLASE in the last 168 hours. No results for input(s): AMMONIA in the last 168 hours. Coagulation Profile: No results for input(s): INR, PROTIME in the last 168 hours. Cardiac Enzymes: No results for input(s): CKTOTAL, CKMB, CKMBINDEX, TROPONINI in the last 168 hours. BNP (last 3 results) No results for input(s): PROBNP in the last 8760 hours. HbA1C: No results for input(s): HGBA1C in the  last 72 hours. CBG: Recent Labs  Lab 08/30/19 1543 08/30/19 1943 08/30/19 2329 08/31/19 0341 08/31/19 0747  GLUCAP 107* 113* 91 105* 111*   Lipid Profile: No results for input(s): CHOL, HDL, LDLCALC, TRIG, CHOLHDL, LDLDIRECT in the last 72 hours. Thyroid Function Tests: No results for input(s): TSH, T4TOTAL, FREET4, T3FREE, THYROIDAB in the last 72 hours. Anemia Panel: No results for input(s): VITAMINB12, FOLATE, FERRITIN, TIBC, IRON, RETICCTPCT in the last 72 hours. Sepsis Labs: No results for input(s): PROCALCITON, LATICACIDVEN in the last 168 hours.  No results found for this or any previous visit (from the past 240 hour(s)).       Radiology Studies: No results found.      Scheduled Meds: . acetaminophen  650 mg Oral Q4H   Or  . acetaminophen (TYLENOL) oral liquid 160 mg/5 mL  650 mg Per Tube Q4H   Or  . acetaminophen  650  mg Rectal Q4H  . chlorhexidine  15 mL Mouth Rinse BID  . Chlorhexidine Gluconate Cloth  6 each Topical Daily  . famotidine  20 mg Per Tube BID  . feeding supplement (ENSURE ENLIVE)  237 mL Oral BID BM  . feeding supplement (OSMOLITE 1.5 CAL)  1,000 mL Per Tube Q24H  . feeding supplement (PRO-STAT SUGAR FREE 64)  30 mL Per Tube TID  . heparin injection (subcutaneous)  5,000 Units Subcutaneous Q8H  . levETIRAcetam  500 mg Per Tube BID  . mouth rinse  15 mL Mouth Rinse q12n4p  . metoprolol tartrate  50 mg Per Tube Q8H  . sodium chloride flush  10-40 mL Intracatheter Q12H   Continuous Infusions: . sodium chloride 40 mL/hr at 08/28/19 0315  . sodium chloride Stopped (08/21/19 2124)  . sodium chloride Stopped (08/23/19 0945)  . ceFEPime (MAXIPIME) IV 2 g (08/31/19 0948)  . vancomycin       LOS: 43 days      Georgette Shell, MD [facility] If 7PM-7AM, please contact night-coverage [email or web address] Password [email or web address] 08/31/2019, 11:39 AM

## 2019-08-31 NOTE — TOC Progression Note (Signed)
Transition of Care [facility]) - Progression Note    Patient Details  Name: Matthew Black MRN: [id number] Date of Birth: February 03, 1976  Transition of Care [facility]) CM/SW [place], [place] Phone Number: 08/31/2019, 12:29 PM  Clinical Narrative:     CSW called patient's spouse, Marcie Bal to inform of bed offer with [facility]. CSW unable to leave voice message due to mail box was full. CSW will back later today.  Thurmond Butts, MSW, [facility] Clinical Social Worker [phone]     Expected Discharge Plan: Skilled Nursing Facility Barriers to Discharge: Insurance Authorization  Expected Discharge Plan and Services Expected Discharge Plan: [place] In-house Referral: Development worker, community Discharge Planning Services: CM Consult Post Acute Care Choice: [place] Living arrangements for the past 2 months: Single Family Home                 DME Arranged: N/A DME Agency: NA       HH Arranged: NA HH Agency: NA         Social Determinants of Health (SDOH) Interventions    Readmission Risk Interventions No flowsheet data found.

## 2019-09-01 LAB — GLUCOSE, CAPILLARY
Glucose-Capillary: 108 mg/dL — ABNORMAL HIGH (ref 70–99)
Glucose-Capillary: 99 mg/dL (ref 70–99)
Glucose-Capillary: 112 mg/dL — ABNORMAL HIGH (ref 70–99)
Glucose-Capillary: 111 mg/dL — ABNORMAL HIGH (ref 70–99)
Glucose-Capillary: 93 mg/dL (ref 70–99)
Glucose-Capillary: 102 mg/dL — ABNORMAL HIGH (ref 70–99)

## 2019-09-01 MED ORDER — OSMOLITE 1.5 CAL PO LIQD
400.0000 mL | ORAL | Status: DC
  Filled 2019-09-01: qty 1000

## 2019-09-01 NOTE — Progress Notes (Signed)
PROGRESS NOTE    Matthew Black  [id number] DOB: 09-05-1976 DOA: 07/19/2019 PCP: Colon Branch, MD  Brief Narrative:43 year old gentleman with no significant past medical history who was found down x2 days nonverbal with left hemiparesis. 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 Patient status post hemicraniectomy with abdominal flap implant. Patient subsequently underwent a hemorrhagic conversion with hematoma 07/30/2019 which worsened on 9/20 with right infarct area. Patient was seen and followed by neurology in the ICU. Patient subsequently underwent right decompressive hemicraniectomy by Dr. Kathyrn Sheriff with flap on 07/19/2019. 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 Patient underwent aspiration on 08/21/2019 per Dr. Kathyrn Sheriff and cultures sent. Patient noted to be not a surgical candidate due to increasing herniation. ID was consulted and patient started empirically on IV vancomycin IV cefepime. Patient also noted to have a bilateral lower extremity DVT and small left lower lobe PE per venous Dopplers and CT angiogram. Heparin was started on 07/26/2019. IVC filter placed 07/21/2019 with plan retrieval 8 to 12 weeks if patient still hospitalized or in the outpatient IR clinic. Hypercoagulable work-up done was negative. Oncology consulted. Patient was on IV heparin however subsequently discontinued due to intracranial hemorrhage and then subsequently resumed given extensive DVT and PE but then subsequently discontinued due to worsening hemorrhage on 08/14/2019. Patient also noted to have abdominal wall hematoma. Patient also noted to have  some seizure-like activity subsequently placed on seizure precautions and on Keppra. Patient status post treatment of Pseudomonas UTI with Zosyn. Noted with dysphagia with speech therapy following. Patient subsequently transferred to tried service on 08/25/2019 with neurology following.   09/01/2019-patient awake follows simple commands good eye contact  Nodding head yes and no   Assessment & Plan:   Active Problems:   Stroke (cerebrum) (HCC)   Pressure injury of skin   Acute respiratory failure with hypoxemia (HCC)   Endotracheal tube present   Deep vein thrombosis (DVT) of non-extremity vein   Hypokalemia   FUO (fever of unknown origin)   Acute blood loss anemia   SIRS (systemic inflammatory response syndrome) (HCC)   Leukocytosis   Primary hypercoagulable state ([place])   Acute pulmonary embolism without acute cor pulmonale (HCC)   Altered mental status   Cerebral abscess   Urinary tract infection without hematuria   #1 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 Questionable etiology per neurology concerned that patient may have had a possible right ICA dissection from exercise. Patient noted to have hemorrhagic conversion with hematoma 07/30/2019 with worsening on 08/14/2019 in the right infarct. Patient followed by neurology throughout the hospitalization. Patient had head CT done that showed a large right brain infarct with edema right ACA and MCA territories. MRI showed large right brain infarct with cytotoxic edema but no hemorrhage. MRA head and neck showed right ICA occluded at origin. Right ICA terminus, R ACA, R MCA occluded. Head CT which was done showed 5 to 6 mm midline shift with no hemorrhage. Patient underwent acute head CT that showed progression with swelling decompressive craniectomy defect. CTA head and neck was done that showed  right ICA occlusion neck that  continues into right ACA and MCA. CT head done 07/23/2019 showed unchanged appearance of massive right MCA and ACA territory infarct with parenchyma extending through decompressive craniectomy. Small focus of suspected hemorrhage adjacent to right caudate head. CT head done 07/30/2019 with new hematoma within infarcted tissue. CT head done 08/01/2019 with no significant change. CT head done 08/11/2019 with new patchy hemorrhage along periphery of infarcted right frontal parietal lobe, decreased dominant anterior margin hemorrhage. Stable herniation of infarcted brain through craniectomy site. CT head done 08/14/2019 with worsening with increased hemorrhagic transformation on the right and new IVH. CT head 08/15/2019 slightly worsened hemorrhagic transformation from previous. CT head done 08/16/2019 with hemorrhagic right ACA and MCA territory infarct with edematous brain bulging through Crani defect. CT head 08/18/2019 with stable hematoma, increased right hemisphere mass-effect, external herniation from Crani site, with market right lateral ventriculomegaly than prior images. 2D echo done with a EF of 60 to 65% of emboli. 2D echo with bubble study ordered was negative for PFO. Lower extremity Dopplers done showed bilateral DVT. 2D echo with bubble study ordered was negative for PFO. Lower extremity Dopplers done showed bilateral DVT. Patient was treated with IV heparin due to extensive DVT and PE but was discontinued due to worsening hemorrhagic transformation. Patient was seen by CIR and declined.  Patient followed by PT OT neurology and palliative care and speech therapy.   #2 cytotoxic cerebral edema-Status post right decompressive hemicraniectomy per Dr. Kathyrn Sheriff with right flap 07/19/2019.  Patient has had multiple CT scans as a follow-up.  The last CT scan was done 08/18/2019 with stable hematoma increased right hemisphere mass-effect external herniation from the [place] site.  No surgical  intervention planned per neurosurgery.   #3 cerebral abscess/empyema/cerebritis status post aspiration and cultures with no growth to date.  Due to increasing herniation patient was thought not to be a surgical candidate.  Patient was started on Vanco and cefepime on 08/20/2019 with duration of therapy 6 to 8 weeks per infectious disease.  Patient will need follow-up CT scan prior to discontinuation of IV antibiotics.  Will need to notify ID close to discharge.  Will need outpatient follow-up with ID once discharged.  #4 bilateral lower extremity DVT and  small left lower lobe pulmonary embolism-patient treated with IV heparin which was subsequently stopped due to increasing thoracic transformation/intracranial hemorrhage.  Patient will need outpatient follow-up with hematology.  He also had an IVC filter placed 07/21/2019 with planned retrieval 8 to 12 weeks per IR.  This is going to be around end of October.  Reconsult IR before the end of October for retrieval of IVC filter.  #5?  Seizure-like activity continue Keppra EEG showed cortical dysfunction in the right frontal region.  No seizure activity reported by the staff.  #6 abdominal wall hematoma status post aspiration no growth.  #7 Pseudomonas UTI patient received a course of Zosyn.  #8 dysphagia due to acute stroke -patient eating more than 75%.  Tube feeds were stopped yesterday.      Nutrition Problem: Inadequate oral intake Etiology: acute illness     Signs/Symptoms: NPO status    Interventions: Tube feeding, Prostat, Ensure Enlive (each supplement provides 350kcal and 20 grams of protein)  Estimated body mass index is 27.8 kg/m as calculated from the following:   Height as of this encounter: 5\' 11"  (1.803 m).   Weight as of this encounter: 90.4 kg.   DVT prophylaxis: SCD Code Status: Full code  family Communication: None at bedside Disposition Plan: SNF once patient is tolerating oral intake to please have been  weaned off Consultants: Infectious disease, oncology, palliative care, CIR, neurosurgery, PCCM   Procedures:- CT head 08/20/2019, 08/18/2019, 08/16/2019, 08/14/2019, 08/11/2019, 08/01/2019, 07/30/2019, 07/23/2019 CT angios head and neck 07/21/2019 CT angiogram chest 08/01/2019 CT abdomen and pelvis 08/04/2019 Abdominal ultrasound 08/11/2019 2D echo 07/20/2019,  2D echo bubble study 08/03/2019 Lower extremity Dopplers 07/21/2019, 08/02/2019  MRI head 08/20/2019, 07/19/2019 Modified barium swallow 08/25/2019 MRA head and neck 07/19/2019 Antimicrobials: Vanco cefepime started 08/20/2019 Zosyn 08/14/2021 08/20/2019 Flagyl 1 dose 08/22/2019  Subjective: Patient awake resting in bed gives good eye contact smiles is on no to questions being asked follow simple commands tube feedings stopped as patient started eating.  Objective: Vitals:   09/01/19 0400 09/01/19 0410 09/01/19 0600 09/01/19 0800  BP: 131/88  114/77 (!) 132/98  Pulse: 92  83 93  Resp: 18  (!) 22 18  Temp:    98.2 F (36.8 C)  TempSrc:    Oral  SpO2: 98%  98% 100%  Weight:  90.4 kg    Height:        Intake/Output Summary (Last 24 hours) at 09/01/2019 1338 Last data filed at 09/01/2019 1130 Gross per 24 hour  Intake 3296.15 ml  Output 3400 ml  Net -103.85 ml   Filed Weights   08/25/19 0500 08/26/19 0500 09/01/19 0410  Weight: 92.3 kg 91.9 kg 90.4 kg    Examination:  General exam: Appears calm and comfortable  Respiratory system: Clear to auscultation. Respiratory effort normal. Cardiovascular system: S1 & S2 heard, RRR. No JVD, murmurs, rubs, gallops or clicks. No pedal edema. Gastrointestinal system: Abdomen is nondistended, soft and nontender. No organomegaly or masses felt. Normal bowel sounds heard.  Hard palpable area in the right lower quadrant Central nervous system: Awake follows simple commands left upper and left lower extremity hemiparesis  extremities: Symmetric 5 x 5 power. Skin: No rashes, lesions or  ulcers Psychiatry: Judgement and insight appear normal. Mood & affect appropriate.     Data Reviewed: I have personally reviewed following labs and imaging studies  CBC: Recent Labs  Lab 08/26/19 0515 08/27/19 0430 08/29/19 0706 08/31/19 0500  WBC 8.1 7.9 7.0 6.1  NEUTROABS 5.4  --   --   --   HGB 10.4* 11.0* 11.6* 11.8*  HCT 32.3* 34.2* 34.7* 35.4*  MCV 85.9 86.4 85.3 86.3  PLT 287 299 272 [id number]   Basic Metabolic Panel: Recent Labs  Lab 08/26/19 0515 08/27/19 0430 08/29/19 0706 08/31/19 0500  NA 135 135 137 137  K 3.5 3.6 3.6 3.5  CL 103 102 101 102  CO2 22 24 26 25   GLUCOSE 107* 117* 127* 133*  BUN 13 13 14 12   CREATININE 0.61 0.65 0.64 0.56*  CALCIUM 8.3* 8.7* 9.1 8.8*   GFR: Estimated Creatinine Clearance: 139.7 mL/min (A) (by C-G formula based on SCr of 0.56 mg/dL (L)). Liver Function Tests: No results for input(s): AST, ALT, ALKPHOS, BILITOT, PROT, ALBUMIN in the last 168 hours. No results for input(s): LIPASE, AMYLASE in the last 168 hours. No results for input(s): AMMONIA in the last 168 hours. Coagulation Profile: No results for input(s): INR, PROTIME in the last 168 hours. Cardiac Enzymes: No results for input(s): CKTOTAL, CKMB, CKMBINDEX, TROPONINI in the last 168 hours. BNP (last 3 results) No results for input(s): PROBNP in the last 8760 hours. HbA1C: No results for input(s): HGBA1C in the last 72 hours. CBG: Recent  Labs  Lab 08/31/19 1935 09/01/19 0019 09/01/19 0334 09/01/19 0933 09/01/19 1231  GLUCAP 86 99 108* 112* 111*   Lipid Profile: No results for input(s): CHOL, HDL, LDLCALC, TRIG, CHOLHDL, LDLDIRECT in the last 72 hours. Thyroid Function Tests: No results for input(s): TSH, T4TOTAL, FREET4, T3FREE, THYROIDAB in the last 72 hours. Anemia Panel: No results for input(s): VITAMINB12, FOLATE, FERRITIN, TIBC, IRON, RETICCTPCT in the last 72 hours. Sepsis Labs: No results for input(s): PROCALCITON, LATICACIDVEN in the last 168  hours.  No results found for this or any previous visit (from the past 240 hour(s)).       Radiology Studies: No results found.      Scheduled Meds:  acetaminophen  650 mg Oral Q4H   Or   acetaminophen (TYLENOL) oral liquid 160 mg/5 mL  650 mg Per Tube Q4H   Or   acetaminophen  650 mg Rectal Q4H   chlorhexidine  15 mL Mouth Rinse BID   Chlorhexidine Gluconate Cloth  6 each Topical Daily   famotidine  20 mg Per Tube BID   feeding supplement (ENSURE ENLIVE)  237 mL Oral BID BM   feeding supplement (OSMOLITE 1.5 CAL)  400 mL Per Tube Q24H   heparin injection (subcutaneous)  5,000 Units Subcutaneous Q8H   levETIRAcetam  500 mg Per Tube BID   mouth rinse  15 mL Mouth Rinse q12n4p   metoprolol tartrate  50 mg Per Tube Q8H   sodium chloride flush  10-40 mL Intracatheter Q12H   Continuous Infusions:  sodium chloride 40 mL/hr at 09/01/19 1130   sodium chloride Stopped (08/21/19 2124)   sodium chloride Stopped (08/23/19 0945)   ceFEPime (MAXIPIME) IV Stopped (09/01/19 0934)   vancomycin Stopped (09/01/19 0710)     LOS: 29 days     Georgette Shell, MD [facility]  If 7PM-7AM, please contact night-coverage [email or web address] Password [email or web address] 09/01/2019, 1:38 PM

## 2019-09-01 NOTE — Progress Notes (Signed)
Physical Therapy Treatment Patient Details Name: Matthew Black MRN: [id number] DOB: 04-07-1976 Today's Date: 09/01/2019    History of Present Illness Pt is a 43 y.o. M with no known PMH who presents on 8/25 after being found down at work, nonverbal with L hemiparesis. CT head showing large R hemisphere infarct with edema and mild mass effect. 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 Stable trace midline shift. S/p hemicraniectomy for  edema with abdominal flap implant. BLE DVT vasc US 8/28: acute DVT bilateral lower extremities, s/p IVC placement.  On 9/21 Repeat CT stable large hemorrhagic conversion.    PT Comments    Patient progressing this session with excellent weight acceptance with sit to stand holding back of chair and even weight shifting some onto L LE with knee blocked.  Still with significant swelling and difficulty with head control, but sitting balance improves when head supported.  Wife is supportive and plans to take pt home in 4 weeks anyway so feel best outcome for this would be CIR.  Will ask for one more assessment by rehab coordinator.  PT to continue with updated frequency.   Follow Up Recommendations  CIR     Equipment Recommendations  Wheelchair (measurements PT);Wheelchair cushion (measurements PT);Hospital bed;Other (comment)    Recommendations for Other Services Rehab consult     Precautions / Restrictions Precautions Precautions: Fall Precaution Comments: L hemiparesis, R skull missing, abd bone flap; mitt for R hand to keep from touching his head Required Braces or Orthoses: Other Brace Other Brace: helmet (not wearing due to presses on edema on R side of head)    Mobility  Bed Mobility Overal bed mobility: Needs Assistance Bed Mobility: Rolling Rolling: Mod assist Sidelying to sit: Mod assist;+2 for physical assistance       General bed mobility comments: rolling in bed for cleaning due to incontinence,  assist and cues for reaching rail and turning hips, assist for trunk upright  Transfers Overall transfer level: Needs assistance   Transfers: Sit to/from Stand Sit to Stand: Max assist;+2 physical assistance         General transfer comment: assist for sit to stand with R hand on back of armchair max A for standing pt able to accept weight and even weight shift some one blocked L LE difficulty with head control and A to bring hips forward, pt stood about 15 seconds max cues and responding to tactile input for posture; lifted via maximove to tilt in space wheelchair  Ambulation/Gait                 Stairs             Wheelchair Mobility    Modified Rankin (Stroke Patients Only) Modified Rankin (Stroke Patients Only) Pre-Morbid Rankin Score: No symptoms Modified Rankin: Severe disability     Balance Overall balance assessment: Needs assistance Sitting-balance support: Feet supported Sitting balance-Leahy Scale: Poor Sitting balance - Comments: leaning to L and with posterior pelvic tilt, head and neck flexion and R rotation; able to improve posture with assist for head position max cues for postural extension seated EOB about 8 minutes                                    Cognition Arousal/Alertness: Awake/alert Behavior During Therapy: Flat affect Overall Cognitive Status: Difficult to assess Area of Impairment: Attention;Safety/judgement;Following commands  Current Attention Level: Sustained   Following Commands: Follows one step commands inconsistently;Follows one step commands with increased time Safety/Judgement: Decreased awareness of deficits Awareness: Intellectual Problem Solving: Slow processing;Decreased initiation;Requires verbal cues;Requires tactile cues        Exercises      General Comments General comments (skin integrity, edema, etc.): Wife in the room and interested in CIR even if not at min a  level states she will take him home in 4 weeks anyway.  Encouraged to call rehab coordinator      Pertinent Vitals/Pain Faces Pain Scale: No hurt    Home Living                      Prior Function            PT Goals (current goals can now be found in the care plan section) Progress towards PT goals: Progressing toward goals    Frequency    Min 4X/week      PT Plan Discharge plan needs to be updated    Co-evaluation              AM-PAC PT "6 Clicks" Mobility   Outcome Measure  Help needed turning from your back to your side while in a flat bed without using bedrails?: A Lot Help needed moving from lying on your back to sitting on the side of a flat bed without using bedrails?: Total Help needed moving to and from a bed to a chair (including a wheelchair)?: Total Help needed standing up from a chair using your arms (e.g., wheelchair or bedside chair)?: Total Help needed to walk in hospital room?: Total Help needed climbing 3-5 steps with a railing? : Total 6 Click Score: 7    End of Session Equipment Utilized During Treatment: Gait belt Activity Tolerance: Patient tolerated treatment well Patient left: in chair;with chair alarm set;with family/visitor present Nurse Communication: Mobility status;Other (comment)(working tilt in space chair) PT Visit Diagnosis: Hemiplegia and hemiparesis;Other symptoms and signs involving the nervous system (R29.898);Other abnormalities of gait and mobility (R26.89) Hemiplegia - Right/Left: Left Hemiplegia - dominant/non-dominant: Non-dominant Hemiplegia - caused by: Cerebral infarction     Time: [id number] PT Time Calculation (min) (ACUTE ONLY): 46 min  Charges:  $Therapeutic Activity: 38-52 mins                     [name], [place] [facility] [phone] 09/01/2019    [name] 09/01/2019, 5:04 PM

## 2019-09-01 NOTE — Plan of Care (Signed)
Pt is resting in bed with eyes closed, breathing is even and unlabored on room air. Pt is alert to voice, aphasic, and able to follow simple commands. Pt is not able to move his left side but is able to move his right arm and leg. Pt only had a couple bites of dinner last night with wife's assistance. Pt was turned throughout the night, no signs of discomfort noted. When asked if hurting, pt would move head from side to side. Pt has not had any signs of shortness of breath, dyspnea, or grimacing. Pt's feedings via cortrak have been stopped this morning per MD orders. No signs of skin break down noted. Call bell is within reach, bed alarm is on, and bed is in lowest position.  Problem: Clinical Measurements: Goal: Ability to maintain clinical measurements within normal limits will improve Outcome: Progressing Goal: Will remain free from infection Outcome: Progressing Goal: Cardiovascular complication will be avoided Outcome: Progressing   Problem: Nutrition: Goal: Adequate nutrition will be maintained Outcome: Progressing   Problem: Pain Managment: Goal: General experience of comfort will improve Outcome: Progressing

## 2019-09-01 NOTE — TOC Progression Note (Signed)
Transition of Care [facility]) - Progression Note    Patient Details  Name: Matthew Black MRN: [id number] Date of Birth: 28-Apr-1976  Transition of [facility]) CM/SW [place], [place] Phone Number: 09/01/2019, 12:06 PM  Clinical Narrative:     CSW called patient's spouse -left voice message to return call.  Thurmond Butts, MSW, [facility] Clinical Social Worker [phone]'   Expected Discharge Plan: [place] Barriers to Discharge: Insurance Authorization  Expected Discharge Plan and Services Expected Discharge Plan: [place] In-house Referral: Development worker, community Discharge Planning Services: CM Consult Post Acute Care Choice: [place] Living arrangements for the past 2 months: Single Family Home                 DME Arranged: N/A DME Agency: NA       HH Arranged: NA HH Agency: NA         Social Determinants of Health (SDOH) Interventions    Readmission Risk Interventions No flowsheet data found.

## 2019-09-01 NOTE — Progress Notes (Signed)
STROKE TEAM PROGRESS NOTE   INTERVAL HISTORY Pt neuro stable, following peripheral simple commands. Not in distress. Comfortable. No drainage from right incision site. Ate 75% breakfast, 80% lunch, 100% dinner. Will d/c nocturnal TF.    Vitals:   09/01/19 0400 09/01/19 0410 09/01/19 0600 09/01/19 0800  BP: 131/88  114/77 (!) 132/98  Pulse: 92  83 93  Resp: 18  (!) 22 18  Temp:    98.2 F (36.8 C)  TempSrc:    Oral  SpO2: 98%  98% 100%  Weight:  90.4 kg    Height:        CBC:  Recent Labs  Lab 08/26/19 0515  08/29/19 0706 08/31/19 0500  WBC 8.1   < > 7.0 6.1  NEUTROABS 5.4  --   --   --   HGB 10.4*   < > 11.6* 11.8*  HCT 32.3*   < > 34.7* 35.4*  MCV 85.9   < > 85.3 86.3  PLT 287   < > 272 285   < > = values in this interval not displayed.    Basic Metabolic Panel:  Recent Labs  Lab 08/29/19 0706 08/31/19 0500  NA 137 137  K 3.6 3.5  CL 101 102  CO2 26 25  GLUCOSE 127* 133*  BUN 14 12  CREATININE 0.64 0.56*  CALCIUM 9.1 8.8*    IMAGING past 24h No results found.   PHYSICAL EXAM  General- Well nourished, well developed, middle-aged African-American male. He has right hemicraniectomy surgical incision. Still has cortrak in place  Ophthalmologic- fundi not visualized due to noncooperation.  Cardiovascular - Regular rhythm and rate.  Neuro - patient is awake, eyes open.  Nonverbal,  following commands on the right side and moves right upper and lower extremity purposefully to command. However, did not follow central commands. Eyes in right gaze position, does not cross midline, not blinking to visual threat to the left, PERRL. Left facial droop. Tongue protrusion not corporative. Has spontaneous movement of RUE against gravity and RLE in bed 3/5 and moving spontaneously. On pain stimulation, mild withdraw of LLE, but no movement of LUE. DTR 1+ and toes equiv. Sensation, coordination and gait not tested.   ASSESSMENT/PLAN Mr. KINGDOM VANZANTEN is a 42 y.o.  male with no significant past medical history found down x 2 days nonverbal with L hemiparesis.   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 Trace L midline shift. ELVO at R ICA, R A1, R MCA.  MRI  Large R brain infarct sparing R PCA. Cytotoxic edema but no hemorrhage. Stable trace midline shift.  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 R ICA terminus, R ACA, R MCA occluded.   CT head similar w/ low density and sweddling R ACA and MCA territories, now with 5-68m midline shift. no hemorrhage  CT head acute R ACA and MCA infarct w/ progressive swelling decompressed through craniectomy defect.   CTA head and neck stable MLS. R ICA occlusion in neck that continues into R ACA and MCA. Mild petechial hemorrhage at basal ganglia  CT Head 8/29 - Unchanged appearance of massive right MCA and ACA territory infarcts with  parenchyma extending through decompressive craniectomy. Small focus of suspected hemorrhage adjacent to the right caudate head.  CT head 07/30/19 - New hematoma within the infarcted tissue   CT head 08/01/2019 stable w/o change  CT head 08/11/19 - new patchy hemorrhage along periphery of infarcted R frontoparietal lobe (likely HT) decreased dominant anterior margin hemorrhage. Stable herniation of infarcted brain through crani site.   CT head 9/20 - worsening with increased HT on the right and new IVH  CT head 08/15/19 - slightly worsened HT from previous.   CT head 9/22 hemorrhagic R ACA and MCA territory infarcts with edematous brain bulging through Crani defect.  No change x2 days  CT head 9/24 stable hematoma, increased right hemisphere mass-effect, external herniation from  Crani site, making right lateral ventriculomegaly than prior images.  CT head 9/26   MRI brain 9/26 R brain infarct. Decompressive crani w/ herniation of brain into site. Overlying subgaleal fluid collection on the R. Large irreg fluid collection antioerio marigin of crani->abscess. Large hemorrhage into R temporoparietal infarct. Mild ventricular enlargement.  2D Echo EF 60-65%  2D echo w/ bubble ordered no LRS   LE venous doppler DVT in R peroneal, L popliteal, L posterior tibial and L peroneal  LE venous doppler repeat DVT in R peroneal, L popliteal, L posterior tibial and L peroneal  TCD bubble no HITS, no PFO  LDL 83  HgbA1c 5.4  UDS positive THC  Hypercoagulable and autoimmune work up negative  Add Heparin 5000 units sq tid for VTE prophylaxis  No antithrombotic prior to admission, treated DVT with heparin IV, then  discontinued due to hemorrhagic conversion with right frontal HT, then resumed heparin IV given extensive DVT and PE, heparin IV again discontinued due to worsening HT  Therapy recommendations:  SNF (reassessed by CIR and declined 9/16 & 10/6)  Disposition:  Pending  Palliative Care consulted met with wife 9/29 - wife wants full aggressive care   Right ICA occlusion  MRA and CTA showed right ICA occlusion from origin to terminal  Wife denies any head trauma  Wife stated that pt had recent aggressive exercise with weight lifting  Concerning dissection as working diagnosis of stroke etiology   Cyctotoxic cerebral edema  S/p R decompressive hemicraniectomy (Nundkumar) w/ flap R abd 07/19/2019  PICC placed - keep for now per Leonie Man  Given One dose of 23.4% 8/26; 3%  off 8/30 1700  CT repeat 07/30/19 - New parenchymal hemorrhage within the infarcted tissue  Off helmet to further release pressure  CT repeat 08/01/2019 stable w/o change  CT head repeat 08/11/19 - new patchy hemorrhage along periphery of infarcted R frontoparietal lobe (likely HT) but  previous right frontal hematoma now much reduced in size  CT head 9/20 worsening right HT with new ICH and IVH  CT Head 9/21 - slightly worsened HT from previous.   CT head 9/22 hemorrhagic R ACA and MCA territory infarcts with edematous brain bulging through Crani defect.  No change x2 days  CT head 9/24 stable hematoma, increased right hemisphere mass-effect, external herniation from Crani site, making right lateral ventriculomegaly than prior images  Liquefactive necrosis with absorption fever - less likely cerebral abscess or empyema  CT 08/20/19 - Irregular collections about the craniectomy site with question of disruption of the underlying dura, which could reflect sequelae of underlying CSF leak. Correlation with physical exam and fluid sampling suggested for further evaluation.  MRI W&WO - 08/20/19 - external herniation of brain into the craniectomy  site. Large irregularly enhancing fluid collection along the anterior margin of the craniectomy measuring approximately 5.4 x 9.8 cm. This may represent a large abscess, sterile hematoma versus infected hematoma.   Dr Kathyrn Sheriff aspirated 9/27 and sent for cx. Not a surgical candidate d/t  increasing herniation.  Cx moderate WBC, no organisms - no growth 3 days  Tmax - 100.5->afebrile  WBC - 8.1->7.9->7.0->6.1   ID on board   Maxipime, Vancomycin 9/26>> (plan tx x 8 weeks)  Repeat MRI prior to stopping therapy after 8 weeks of vancomycin/cefepime  Notify ID once pt close to d/c  Blood cultures - neg, repeated again 08/04/19 -> no growth on empiric unasyn -> changed to vanco, cefepime and flagyl 08/05/19->now off  Lactic acid 1.2->2.0  Hepatitis panel 9/12 - neg  procalcitonin and lactic acid normal   B LE DVT Small LLL PE  LE venous doppler DVT in R peroneal, L popliteal, L posterior tibial and L peroneal  Etiology unclear  Started IV heparin 07/26/2019  IVC filter placed 8/27. Plan retrieval in 8-12 weeks as an IP in an  IR clinic  Hypercoagulable work up negative   LE venous doppler 08/02/19 repeat extensive DVT in R peroneal, L popliteal, L posterior tibial and L peroneal up to the IVC filter  CT chest Nonocclusive thrombus seen within the left posterior lower lobe segmental artery.   Treated with IV Heparin but then off secondary to intracranial hemorrhage, then restarted IV heparin given extensive DVT and PE but again stopped due to worsening HT on 9/20  Hematology consult, appreciate help - hypercoag w/u neg.  At risk for recurrent clots   Factor V Leiden, homocysteine, Prot C&S, Prothombin gene mutation all neg.   Abdominal wall hematoma  Large fluid collection seen on CT abdomen  CT abd/pel concerning for abdominal wall fluid collection likely old hematoma  Dr. Kathyrn Sheriff did fluid aspiration and seems to be old blood component  Fluid aspiration sent for culture 9/11 - no growth < 24 hrs  Repeat ABD [place] complex seroma vs liquified hematoma R abd similar to previous  Seizure-like activity  EEG continuous slowing, excessive beta activity, sleep spindle asymmetry decreased R->related to stroke and sedation. No SZ  Long Term EEG cortical dysfunction in right frontal region  Seizure precautions  Continue Keppra   UTI, resolved  Urine culture pseudomonas - treated with  to zosyn  UA 9/21 - WBC 0-5 negative   UA 9/23 - WBC 11-20  Repeat B Cx no growth  Tachycardia, resolved  HR normal now  Likely due to fever  Hydration with IVF, TF and free water  On metoprolol to 50 Q8h  EKG ST, rate 120 w/ possible LA enlargement  Troponin series neg x 3    Hyperlipidemia  Home meds:  no statin  LDL 83, goal < 70  Was on lipitor 20 mg daily   D/c statin due to elevated LFT  Consider statin at d/c if LFTs down  Elevated LFT, almost resolved  AST - 105-118-98-85-38-36-pending  ALT - 80-68-65-164-64-53-pending  CK 2063-852 [id number]  Hepatitis panel 9/12 -  neg  Dysphagia . Secondary to stroke  IVF @ 40cc total  Cleared for Dysphagia 1 nectar thick liquids 10/1  Speech on board  ate well today 75-100% of meals, will d/c TF  Keep cortrak for the next 2-3 days in case need TF again  Other Stroke Risk Factors  Former Cigarette smoker, quit 3 yrs ago  ETOH use  Substance abuse  UDS - positive for THC   Other Active Problems  Hypokalemia 3.2 - replaced - 3.6-3.5   Normocytic anemia - hematology felt d/t ongoing blood draws Hgb 10.4->11.0->11.6->11.8   Hospital day # 44  Rosalin Hawking, MD PhD Stroke Neurology 09/01/2019 4:25 PM   To contact Stroke Continuity provider, please refer to [email or web address] After hours, contact General Neurology

## 2019-09-02 ENCOUNTER — Other Ambulatory Visit: Payer: Self-pay | Admitting: Neurology

## 2019-09-02 LAB — BASIC METABOLIC PANEL
Anion gap: 11 (ref 5–15)
BUN: 11 mg/dL (ref 6–20)
CO2: 23 mmol/L (ref 22–32)
Calcium: 8.6 mg/dL — ABNORMAL LOW (ref 8.9–10.3)
Chloride: 105 mmol/L (ref 98–111)
Creatinine, Ser: 0.61 mg/dL (ref 0.61–1.24)
GFR calc Af Amer: >60 mL/min (ref >60)
GFR calc non Af Amer: >60 mL/min (ref >60)
Glucose, Bld: 104 mg/dL — ABNORMAL HIGH (ref 70–99)
Potassium: 3.2 mmol/L — ABNORMAL LOW (ref 3.5–5.1)
Sodium: 139 mmol/L (ref 135–145)

## 2019-09-02 LAB — GLUCOSE, CAPILLARY
Glucose-Capillary: 101 mg/dL — ABNORMAL HIGH (ref 70–99)
Glucose-Capillary: 105 mg/dL — ABNORMAL HIGH (ref 70–99)
Glucose-Capillary: 106 mg/dL — ABNORMAL HIGH (ref 70–99)
Glucose-Capillary: 109 mg/dL — ABNORMAL HIGH (ref 70–99)
Glucose-Capillary: 74 mg/dL (ref 70–99)
Glucose-Capillary: 90 mg/dL (ref 70–99)

## 2019-09-02 LAB — CBC
HCT: 33.5 % — ABNORMAL LOW (ref 39.0–52.0)
Hemoglobin: 11.2 g/dL — ABNORMAL LOW (ref 13.0–17.0)
MCH: 28.9 pg (ref 26.0–34.0)
MCHC: 33.4 g/dL (ref 30.0–36.0)
MCV: 86.3 fL (ref 80.0–100.0)
Platelets: 259 10*3/uL (ref 150–400)
RBC: 3.88 MIL/uL — ABNORMAL LOW (ref 4.22–5.81)
RDW: 15.3 % (ref 11.5–15.5)
WBC: 6.2 10*3/uL (ref 4.0–10.5)
nRBC: 0 % (ref 0.0–0.2)

## 2019-09-02 LAB — HEPATIC FUNCTION PANEL
ALT: 75 U/L — ABNORMAL HIGH (ref 0–44)
AST: 34 U/L (ref 15–41)
Albumin: 2.7 g/dL — ABNORMAL LOW (ref 3.5–5.0)
Alkaline Phosphatase: 111 U/L (ref 38–126)
Bilirubin, Direct: 0.1 mg/dL (ref 0.0–0.2)
Indirect Bilirubin: 0.8 mg/dL (ref 0.3–0.9)
Total Bilirubin: 0.9 mg/dL (ref 0.3–1.2)
Total Protein: 6.2 g/dL — ABNORMAL LOW (ref 6.5–8.1)

## 2019-09-02 LAB — MAGNESIUM: Magnesium: 1.9 mg/dL (ref 1.7–2.4)

## 2019-09-02 LAB — VANCOMYCIN, TROUGH: Vancomycin Tr: 15 ug/mL (ref 15–20)

## 2019-09-02 MED ORDER — POTASSIUM CHLORIDE 10 MEQ/100ML IV SOLN
10.0000 meq | INTRAVENOUS | Status: AC
  Administered 2019-09-02 (×4): 10 meq via INTRAVENOUS
  Filled 2019-09-02 (×4): qty 100

## 2019-09-02 MED ORDER — ENSURE ENLIVE PO LIQD
237.0000 mL | Freq: Three times a day (TID) | ORAL | Status: DC
  Administered 2019-09-02 – 2019-09-06 (×11): 237 mL via ORAL

## 2019-09-02 NOTE — Progress Notes (Addendum)
  Speech Language Pathology Treatment: Dysphagia;Cognitive-Linquistic  Patient Details Name: Matthew Black MRN: [id number] DOB: Aug 17, 1976 Today's Date: 09/02/2019 Time: [id number] SLP Time Calculation (min) (ACUTE ONLY): 30 min  Assessment / Plan / Recommendation Clinical Impression  SLP observed pt with nectar thick liquids, trials upgraded texture, facilitated communication and provided education with wife at bedside. Updated wife pt's status/progress with swallowing and communication. Wife reported he has started to verbalize with her in phrases (3-5 words before stopping with anomia). This therapist has not observed mouthing or vocalization. He comprehends basic info and holds up fingers to communicate age of kids but despite max cues/encouragement no articulatory or phonatory attempts. In addition to language deficits suspect component of mood/behavioral impact as would be expected. Encouraged him to attempt mouthing/phonation to determine his capabilities vs deficits.   SLP upgraded texture to Dys 2 after he masticated cracker timely without oral residue consistently. Consume nectar thick without indications of decreased airway protection. Recommend repeat MBS next week for liquid upgrade. Per RN and family report he is eating greater than 50% meals consistently. Recommend remove Cortrak if MD in agreement.    HPI HPI: Pt is a 43 y.o. with no known PMH who presents on 8/25 after being found down at work, nonverbal with L hemiparesis. 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 Stable trace midline shift. S/p hemicraniectomy with abdominal flap implant. Intubated 8/26-8/31. CXR worsened atelectasis in the right lower lobe. MBS completed 08/09/2019 with recommendation for Dys1/thin liquids. Pt demonstrated increased lethargy and vomiting. Hemorrhagic conversion on CT, transferred back to ICU. Head CT 08/14/19 with increase in hemorrhage, edema, mass effect and  compression of ventricle      SLP Plan  Continue with current plan of care       Recommendations  Diet recommendations: Dysphagia 2 (fine chop);Nectar-thick liquid Liquids provided via: Cup Medication Administration: Whole meds with puree Supervision: Patient able to self feed;Full supervision/cueing for compensatory strategies Compensations: Minimize environmental distractions;Slow rate;Small sips/bites;Lingual sweep for clearance of pocketing Postural Changes and/or Swallow Maneuvers: Seated upright 90 degrees                Oral Care Recommendations: Oral care BID Follow up Recommendations: Skilled Nursing facility SLP Visit Diagnosis: Cognitive communication deficit (R41.841);Aphasia (R47.01) Plan: Continue with current plan of care       GO                Houston Siren 09/02/2019, 4:23 PM   Orbie Pyo Colvin Caroli.Ed Risk analyst [phone] Office [phone]

## 2019-09-02 NOTE — Progress Notes (Addendum)
STROKE TEAM PROGRESS NOTE   INTERVAL HISTORY Pt doing well, neuro stable, eats well, TF has stopped. Lab stable with only K 3.2. pending SNF  Vitals:   09/02/19 0317 09/02/19 0332 09/02/19 0400 09/02/19 0730  BP:  127/87 130/84 (!) 127/93  Pulse:  89 87 89  Resp:  16    Temp:  98.5 F (36.9 C)  (!) 97.5 F (36.4 C)  TempSrc:  Axillary  Oral  SpO2:  100% 100% 100%  Weight: 89.4 kg     Height:        CBC:  Recent Labs  Lab 08/31/19 0500 09/02/19 0529  WBC 6.1 6.2  HGB 11.8* 11.2*  HCT 35.4* 33.5*  MCV 86.3 86.3  PLT 285 160    Basic Metabolic Panel:  Recent Labs  Lab 08/31/19 0500 09/02/19 0529 09/02/19 0812  NA 137 139  --   K 3.5 3.2*  --   CL 102 105  --   CO2 25 23  --   GLUCOSE 133* 104*  --   BUN 12 11  --   CREATININE 0.56* 0.61  --   CALCIUM 8.8* 8.6*  --   MG  --   --  1.9    IMAGING past 24h No results found.   PHYSICAL EXAM  General- Well nourished, well developed, middle-aged African-American male. He has right hemicraniectomy surgical incision. Still has cortrak in place  Ophthalmologic- fundi not visualized due to noncooperation.  Cardiovascular - Regular rhythm and rate.  Neuro - patient is awake, eyes open.  Nonverbal,  following commands on the right side and moves right upper and lower extremity purposefully to command. However, did not follow central commands. Eyes in right gaze position, does not cross midline, not blinking to visual threat to the left, PERRL. Left facial droop. Tongue protrusion not corporative. Has spontaneous movement of RUE against gravity and RLE in bed 3/5 and moving spontaneously. On pain stimulation, mild withdraw of LLE, but no movement of LUE. DTR 1+ and toes equiv. Sensation, coordination and gait not tested.   ASSESSMENT/PLAN Matthew Black is a 43 y.o. male with no significant past medical history found down x 2 days nonverbal with L hemiparesis.   Stroke:  R MCA/ACA infarct w/ R ICA, R A1, R MCA  occlusion w/ cerebral edema s/p hemicraniectomy w/ abd flap implant - stroke etiology unclear - possible R ICA dissection from exercise (see below) Hemorrhagic conversion with hematoma 07/30/19 and worsening on 9/20 in R infarct area  CT head large R brain infarct w/ edema R ACA and MCA territories. Trace L midline shift. ELVO at R ICA, R A1, R MCA.  MRI  Large R brain infarct sparing R PCA. Cytotoxic edema but no hemorrhage. Stable trace midline shift.  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 R ICA terminus, R ACA, R MCA occluded.   CT head similar w/ low density and sweddling R ACA and MCA territories, now with 5-33m midline shift. no hemorrhage  CT head acute R ACA and MCA infarct w/ progressive swelling decompressed through craniectomy defect.   CTA head and neck stable MLS. R ICA occlusion in neck that continues into R ACA and MCA. Mild petechial hemorrhage at basal ganglia  CT Head 8/29 - Unchanged appearance of massive right MCA and ACA territory infarcts with parenchyma extending through decompressive craniectomy. Small focus of suspected hemorrhage adjacent to the right caudate head.  CT head 07/30/19 - New hematoma within the  infarcted tissue   CT head 08/01/2019 stable w/o change  CT head 08/11/19 - new patchy hemorrhage along periphery of infarcted R frontoparietal lobe (likely HT) decreased dominant anterior margin hemorrhage. Stable herniation of infarcted brain through crani site.   CT head 9/20 - worsening with increased HT on the right and new IVH  CT head 08/15/19 - slightly worsened HT from previous.   CT head 9/22 hemorrhagic R ACA and MCA territory infarcts with edematous brain bulging through Crani defect.  No change x2 days  CT head 9/24 stable hematoma, increased right hemisphere mass-effect, external herniation from Crani site, making right lateral ventriculomegaly than prior images.  CT head 9/26   MRI brain 9/26 R brain infarct. Decompressive crani w/  herniation of brain into site. Overlying subgaleal fluid collection on the R. Large irreg fluid collection antioerio marigin of crani->abscess. Large hemorrhage into R temporoparietal infarct. Mild ventricular enlargement.  2D Echo EF 60-65%  2D echo w/ bubble ordered no LRS   LE venous doppler DVT in R peroneal, L popliteal, L posterior tibial and L peroneal  LE venous doppler repeat DVT in R peroneal, L popliteal, L posterior tibial and L peroneal  TCD bubble no HITS, no PFO  LDL 83  HgbA1c 5.4  UDS positive THC  Hypercoagulable and autoimmune work up negative  Add Heparin 5000 units sq tid for VTE prophylaxis  No antithrombotic prior to admission, treated DVT with heparin IV, then  discontinued due to hemorrhagic conversion with right frontal HT, then resumed heparin IV given extensive DVT and PE, heparin IV again discontinued due to worsening HT  Therapy recommendations:  SNF (reassessed by CIR and declined 9/16 & 10/6)  Disposition:  Pending - SW has bed offer. Trying to get in touch with wife  Palliative Care consulted met with wife 9/29 - wife wants full aggressive care   Right ICA occlusion  MRA and CTA showed right ICA occlusion from origin to terminal  Wife denies any head trauma  Wife stated that pt had recent aggressive exercise with weight lifting  Concerning dissection as working diagnosis of stroke etiology   Cyctotoxic cerebral edema  S/p R decompressive hemicraniectomy (Nundkumar) w/ flap R abd 07/19/2019  PICC placed - keep for now per Leonie Man  Given One dose of 23.4% 8/26; 3%  off 8/30 1700  CT repeat 07/30/19 - New parenchymal hemorrhage within the infarcted tissue  Off helmet to further release pressure  CT repeat 08/01/2019 stable w/o change  CT head repeat 08/11/19 - new patchy hemorrhage along periphery of infarcted R frontoparietal lobe (likely HT) but previous right frontal hematoma now much reduced in size  CT head 9/20 worsening right HT  with new ICH and IVH  CT Head 9/21 - slightly worsened HT from previous.   CT head 9/22 hemorrhagic R ACA and MCA territory infarcts with edematous brain bulging through Crani defect.  No change x2 days  CT head 9/24 stable hematoma, increased right hemisphere mass-effect, external herniation from Crani site, making right lateral ventriculomegaly than prior images  Liquefactive necrosis with absorption fever - less likely cerebral abscess or empyema  CT 08/20/19 - Irregular collections about the craniectomy site with question of disruption of the underlying dura, which could reflect sequelae of underlying CSF leak. Correlation with physical exam and fluid sampling suggested for further evaluation.  MRI W&WO - 08/20/19 - external herniation of brain into the craniectomy site. Large irregularly enhancing fluid collection along the anterior margin of the craniectomy  measuring approximately 5.4 x 9.8 cm. This may represent a large abscess, sterile hematoma versus infected hematoma.   Dr Kathyrn Sheriff aspirated 9/27 and sent for cx. Not a surgical candidate d/t  increasing herniation.  Cx moderate WBC, no organisms - no growth 3 days  Tmax - 100.5->afebrile  WBC - 8.1->7.9->7.0->6.1   ID on board   Maxipime, Vancomycin 9/26>> (plan tx x 8 weeks)  Repeat MRI prior to stopping therapy after 8 weeks of vancomycin/cefepime  Notify ID once pt close to d/c  Blood cultures - neg, repeated again 08/04/19 -> no growth on empiric unasyn -> changed to vanco, cefepime and flagyl 08/05/19->now off  Lactic acid 1.2->2.0  Hepatitis panel 9/12 - neg  procalcitonin and lactic acid normal   B LE DVT Small LLL PE  LE venous doppler DVT in R peroneal, L popliteal, L posterior tibial and L peroneal  Etiology unclear  Started IV heparin 07/26/2019  IVC filter placed 8/27. Plan retrieval in 8-12 weeks as an IP in an IR clinic  Hypercoagulable work up negative   LE venous doppler 08/02/19 repeat extensive  DVT in R peroneal, L popliteal, L posterior tibial and L peroneal up to the IVC filter  CT chest Nonocclusive thrombus seen within the left posterior lower lobe segmental artery.   Treated with IV Heparin but then off secondary to intracranial hemorrhage, then restarted IV heparin given extensive DVT and PE but again stopped due to worsening HT on 9/20  Hematology consult, appreciate help - hypercoag w/u neg.  At risk for recurrent clots   Factor V Leiden, homocysteine, Prot C&S, Prothombin gene mutation all neg.   Abdominal wall hematoma  Large fluid collection seen on CT abdomen  CT abd/pel concerning for abdominal wall fluid collection likely old hematoma  Dr. Kathyrn Sheriff did fluid aspiration and seems to be old blood component  Fluid aspiration sent for culture 9/11 - no growth < 24 hrs  Repeat ABD [place] complex seroma vs liquified hematoma R abd similar to previous  Seizure-like activity  EEG continuous slowing, excessive beta activity, sleep spindle asymmetry decreased R->related to stroke and sedation. No SZ  Long Term EEG cortical dysfunction in right frontal region  Seizure precautions  Continue Keppra   UTI, resolved  Urine culture pseudomonas - treated with  to zosyn  UA 9/21 - WBC 0-5 negative   UA 9/23 - WBC 11-20  Repeat B Cx no growth  Tachycardia, resolved  HR normal now  Likely due to fever  Hydration with IVF, TF and free water  On metoprolol to 50 Q8h  EKG ST, rate 120 w/ possible LA enlargement  Troponin series neg x 3    Hyperlipidemia  Home meds:  no statin  LDL 83, goal < 70  Was on lipitor 20 mg daily   D/c statin due to elevated LFT  Consider statin at d/c if LFTs down  Elevated LFT, almost resolved  AST - 105-118-98-85-38-36-34  ALT - 80-68-65-164-64-53-75  CK 2063-852 -[phone]  Hepatitis panel 9/12 - neg  Dysphagia . Secondary to stroke  IVF @ 40cc total  Cleared for Dysphagia 1 nectar thick liquids  10/1  Speech on board  TF discontinued   Keep cortrak for the next 2 days in case need TF again, and then cortrak can be discontinued.  Other Stroke Risk Factors  Former Cigarette smoker, quit 3 yrs ago  ETOH use  Substance abuse UDS - positive for THC   Other Active Problems  Hypokalemia 3.2 -  replaced - 3.6-3.5-3.2 - supplement  Normocytic anemia - hematology felt d/t ongoing blood draws Hgb 10.4->11.0->11.6->11.8->11.2  Hospital day # 45  Neurology will sign off. Please call with questions. Pt will follow up with stroke clinic Dr. Leonie Man at [facility] in about 4 weeks after discharge. Thanks for the consult. Discussed with Dr. Ivan Croft.    Rosalin Hawking, MD PhD Stroke Neurology 09/02/2019 10:00 AM   To contact Stroke Continuity provider, please refer to [email or web address] After hours, contact General Neurology

## 2019-09-02 NOTE — Progress Notes (Signed)
Pharmacy Antibiotic Note  Matthew Black is a 43 y.o. 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  Pharmacy has been consulted for Vancomycin dosing.  Given the patient's abscess is in the epidural space and there is mention of cerebritis we will target a trough of 15-20. The patient had a trough today that came back as 15 on 1500mg  q8h hours Renal function is good and stable.   Plan: Continue Vancomycin to 1500 mg IV q8h  Continue Cefepime 2 gms IV q8hr Monitor renal function, clinical status, vanc levels  Height: 5\' 11"  (180.3 cm) Weight: 197 lb 1.5 oz (89.4 kg) IBW/kg (Calculated) : 75.3  Temp (24hrs), Avg:98.3 F (36.8 C), Min:97.5 F (36.4 C), Max:98.6 F (37 C)  Recent Labs  Lab 08/27/19 0430 08/29/19 0706 08/31/19 0500 08/31/19 0530 09/02/19 0529 09/02/19 1330  WBC 7.9 7.0 6.1  --  6.2  --   CREATININE 0.65 0.64 0.56*  --  0.61  --   VANCOTROUGH  --   --   --  12*  --  15    Estimated Creatinine Clearance: 129.4 mL/min (by C-G formula based on SCr of 0.61 mg/dL).    Allergies  Allergen Reactions  . Hydrocodone Nausea Only    Antimicrobials this admission: Zosyn 9/21>>9/26 Vanc 9/26 >> (11/20) Cefepime 9/26 >> (11/20)  Thank you for allowing pharmacy to be a part of this patient's care.  Salome Arnt, PharmD, BCPS Please see AMION for all pharmacy numbers 09/02/2019 2:22 PM

## 2019-09-02 NOTE — Progress Notes (Signed)
Nutrition Follow-up  DOCUMENTATION CODES:   Obesity unspecified  INTERVENTION:  Provide Ensure Enlive po TID (thickened to nectar thick consistency), each supplement provides 350 kcal and 20 grams of protein.  Encourage adequate PO intake.   NUTRITION DIAGNOSIS:   Inadequate oral intake related to acute illness as evidenced by NPO status; diet advanced; improving  GOAL:   Patient will meet greater than or equal to 90% of their needs; progressing  MONITOR:   PO intake, Supplement acceptance, Diet advancement, TF tolerance, Weight trends, Labs, I & O's, Skin  REASON FOR ASSESSMENT:   Ventilator, Consult Enteral/tube feeding initiation and management  ASSESSMENT:   43 year old male who presented to the ED on 8/25 with AMS. No known PMH. CT head showed large R hemisphere infarct with edema and mild mass effect.  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    Diet has been advanced to a dysphagia 2 diet with nectar thick liquids. Meal completion has been 75-95%. Tube feeding orders have been discontinued. Cortrak to remain in place for ~2 more days in case tube feeds may need to be restarted. If po intake remains adequate, Cortrak may be discontinued. Pt currently has Ensure ordered and has been consuming them. RD to increase Ensure to TID to aid in increased caloric and protein needs.   Labs and medications reviewed.   Diet Order:   Diet Order            DIET DYS 2 Room service appropriate? No; Fluid consistency: Nectar Thick  Diet effective now              EDUCATION NEEDS:   No education  needs have been identified at this time  Skin:  Skin Assessment: Skin Integrity Issues: Skin Integrity Issues:: Incisions Stage II: N/A Incisions: R head  Last BM:  10/7  Height:   Ht Readings from Last 1 Encounters:  08/02/19 5\' 11"  (1.803 m)    Weight:   Wt Readings from Last 1 Encounters:  09/02/19 89.4 kg    Ideal Body Weight:  78.2 kg  BMI:  Body mass index is 27.49 kg/m.  Estimated Nutritional Needs:   Kcal:  [id number] kcals  Protein:  117-135  Fluid:  >/= 2.1 L/day    Corrin Parker, MS, RD, LDN Pager # [phone] After hours/ weekend pager # [phone]

## 2019-09-02 NOTE — Progress Notes (Addendum)
Inpatient Rehabilitation Admissions Coordinator  Per Physical Therapist request, Jenny Reichmann, I met with pt's wife at patient's bedside to reassess him for an inpt rehab admission. Wife states the SNF bed offer she has received is unacceptable to her and she would like to have him admitted to CIR. I discussed goals and expectations of an inpt rehab admission. Also that if rehab MD felt patient at a level to tolerate the intensity, that we can not guarantee 4 weeks of CIR. If patient does not progress to a level that he can be cared for at home, that she would no longer have the one SNF bed offer. These are all discussions we had concerning barriers to eventual d/c home. I have recommended Rehab MD to assess patient at bedside for tolerance capabilities and acceptance into CIR for this is our 4th reassessment. I will then follow up with spouse with rehab venue options. I contacted SW, Caren Griffins, to update on my reassessment. I also recommend SNF search beyond 50 mile radius for additional bed offers that may be acceptable to pt's wife.  Danne Baxter, RN, MSN Rehab Admissions Coordinator [phone] 09/02/2019 2:56 PM

## 2019-09-02 NOTE — TOC Progression Note (Addendum)
Transition of Care [facility]) - Progression Note    Patient Details  Name: Matthew Black MRN: [id number] Date of Birth: 1976/03/25  Transition [facility]) CM/SW [place], [place] Phone Number: 09/02/2019, 11:34 AM  Clinical Narrative:     CSW gave patient's wife bed offer at [facility]- patient's wife states refusing offer. She expressed concerns regarding their ratings and quality of care. CSW reiterate placement challenges such as insurance. She states she is working on Physiological [facility]. Patient's spouse is overall hesitant regarding SNF placement.  CSW will continue to follow and assist with discharge planning.   Thurmond Butts, MSW, [facility] Clinical Social Worker [phone]   Expected Discharge Plan: Skilled Nursing Facility Barriers to Discharge: Insurance Authorization  Expected Discharge Plan and Services Expected Discharge Plan: [place] In-house Referral: Development worker, community Discharge Planning Services: CM Consult Post Acute Care Choice: [place] Living arrangements for the past 2 months: Single Family Home                 DME Arranged: N/A DME Agency: NA       HH Arranged: NA HH Agency: NA         Social Determinants of Health (SDOH) Interventions    Readmission Risk Interventions No flowsheet data found.

## 2019-09-02 NOTE — Progress Notes (Signed)
PROGRESS NOTE    Matthew Black  [id number] DOB: [birth year] DOA: 07/19/2019 PCP: Colon Branch, MD    Brief Narrative: 43 year old gentleman with no significant past medical history who was found down x2 days nonverbal with left hemiparesis. 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 Patient status post hemicraniectomy with abdominal flap implant. Patient subsequently underwent a hemorrhagic conversion with hematoma 07/30/2019 which worsened on 9/20 with right infarct area. Patient was seen and followed by neurology in the ICU. Patient subsequently underwent right decompressive hemicraniectomy by Dr. Kathyrn Sheriff with flap on 07/19/2019. 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 Patient underwent aspiration on 08/21/2019 per Dr. Kathyrn Sheriff and cultures sent. Patient noted to be not a surgical candidate due to increasing herniation. ID was consulted and patient started empirically on IV vancomycin IV cefepime. Patient also noted to have a bilateral lower extremity DVT and small left lower lobe PE per venous Dopplers and CT angiogram. Heparin was started on 07/26/2019. IVC filter placed 07/21/2019 with plan retrieval 8 to 12 weeks if patient still hospitalized or in the outpatient IR clinic. Hypercoagulable work-up done was negative. Oncology consulted. Patient was on IV heparin however subsequently discontinued due to intracranial hemorrhage and then subsequently resumed given extensive DVT and PE but then subsequently discontinued due to worsening hemorrhage on 08/14/2019. Patient also noted to have abdominal wall hematoma. Patient also noted to  have some seizure-like activity subsequently placed on seizure precautions and on Keppra. Patient status post treatment of Pseudomonas UTI with Zosyn. Noted with dysphagia with speech therapy following. Patient subsequently transferred to tried service on 08/25/2019 with neurology following.   09/01/2019-patient awake follows simple commands good eye contact  Nodding head yes and no  09/02/2019-staff feeding the patient he is awake alert and swallowing remains aphasic moves right upper and lower extremities  Assessment & Plan:   Active Problems:   Stroke (cerebrum) (HCC)   Pressure injury of skin   Acute respiratory failure with hypoxemia (HCC)   Endotracheal tube present   Deep vein thrombosis (DVT) of non-extremity vein   Hypokalemia   FUO (fever of unknown origin)   Acute blood loss anemia   SIRS (systemic inflammatory response syndrome) (HCC)   Leukocytosis   Primary hypercoagulable state ([place])   Acute pulmonary embolism without acute cor pulmonale (HCC)   Altered mental status   Cerebral abscess   Urinary tract infection without hematuria   #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 Questionable etiology per neurology concerned that patient may have had a possible right ICA dissection from exercise. Patient noted to have hemorrhagic conversion with hematoma 07/30/2019 with worsening on 08/14/2019 in the right infarct. Patient followed by neurology throughout the hospitalization. Patient had head CT done that showed a large right brain infarct with edema right ACA and MCA territories. MRI showed large right brain infarct with cytotoxic edema but no hemorrhage. MRA head and neck showed right ICA occluded at origin. Right ICA terminus, R ACA, R MCA occluded. Head CT which was done showed 5 to 6 mm midline shift with no hemorrhage. Patient  underwent acute head CT that showed  progression with swelling decompressive craniectomy defect. CTA head and neck was done that showed right ICA occlusion neck that continues into right ACA and MCA. CT head done 07/23/2019 showed unchanged appearance of massive right MCA and ACA territory infarct with parenchyma extending through decompressive craniectomy. Small focus of suspected hemorrhage adjacent to right caudate head. CT head done 07/30/2019 with new hematoma within infarcted tissue. CT head done 08/01/2019 with no significant change. CT head done 08/11/2019 with new patchy hemorrhage along periphery of infarcted right frontal parietal lobe, decreased dominant anterior margin hemorrhage. Stable herniation of infarcted brain through craniectomy site. CT head done 08/14/2019 with worsening with increased hemorrhagic transformation on the right and new IVH. CT head 08/15/2019 slightly worsened hemorrhagic transformation from previous. CT head done 08/16/2019 with hemorrhagic right ACA and MCA territory infarct with edematous brain bulging through Crani defect. CT head 08/18/2019 with stable hematoma, increased right hemisphere mass-effect, external herniation from Crani site, with market right lateral ventriculomegaly than prior images. 2D echo done with a EF of 60 to 65% of emboli. 2D echo with bubble study ordered was negative for PFO. Lower extremity Dopplers done showed bilateral DVT. 2D echo with bubble study ordered was negative for PFO. Lower extremity Dopplers done showed bilateral DVT. Patient was treated with IV heparin due to extensive DVT and PE but was discontinued due to worsening hemorrhagic transformation. Pending SNF  #2 cytotoxic cerebral edema-Status post right decompressive hemicraniectomy per Dr. Kathyrn Sheriff with right flap 07/19/2019.Patient has had multiple CT scans as a follow-up. The last CT scan was done 08/18/2019 with stable hematoma increased right hemisphere mass-effect external herniation from the [place]  site.   No surgery planned   #3 cerebral abscess/empyema/cerebritis status post aspiration and cultures with no growth to date. Due to increasing herniation patient was thought not to be a surgical candidate. Patient was started on Vanco and cefepime on 08/20/2019 with duration of therapy 6 to 8 weeks per infectious disease.  Patient will need follow-up CT scan prior to discontinuation of IV antibiotics and notify infectious disease prior to discharge and will need follow-up outpatient ID.   I will DC #4 bilateral lower extremity DVT and small left lower lobe pulmonary embolism-patient treated with IV heparin which was subsequently stopped due to increasing thoracic transformation/intracranial hemorrhage. Patient will need outpatient follow-up with hematology. He also had an IVC filter placed 07/21/2019 with planned retrieval 8 to 12 weeks per IR. This is going to be around end of October.  Reconsult IR before the end of October for retrieval of IVC filter.  #5?Seizure-like activity continue Keppra EEG showed cortical dysfunction in the right frontal region.  No seizure activity reported by the staff.  #6 abdominal wall hematoma status post aspiration no growth. #7 Pseudomonas UTI patient received a course of Zosyn.  #8 dysphagia due to acute stroke -patient eating more than 75%.  Tube feeds were stopped yesterday.        Nutrition Problem: Inadequate oral intake Etiology: acute illness     Signs/Symptoms: NPO status    Interventions: Tube feeding, Prostat, Ensure Enlive (each supplement provides 350kcal and 20 grams of protein)  Estimated body mass index is 27.49 kg/m as calculated from the following:   Height as of this encounter: 5\' 11"  (1.803 m).   Weight as of this encounter: 89.4 kg. DVT prophylaxis:SCD Code Status:Full code  family Communication:None at bedside Disposition Plan:SNF once patient is tolerating oral intake to please have been  weaned  off Consultants:Infectious disease, oncology, palliative care, CIR, neurosurgery, PCCM   Procedures:- CT head 08/20/2019, 08/18/2019, 08/16/2019, 08/14/2019, 08/11/2019, 08/01/2019, 07/30/2019, 07/23/2019 CT angios head and neck 07/21/2019 CT angiogram chest 08/01/2019 CT abdomen and pelvis 08/04/2019 Abdominal ultrasound 08/11/2019 2D echo 07/20/2019,  2D echo bubble study 08/03/2019 Lower extremity Dopplers 07/21/2019, 08/02/2019  MRI head 08/20/2019, 07/19/2019 Modified barium swallow 08/25/2019 MRA head and neck 07/19/2019 Antimicrobials:Vanco cefepime started 08/20/2019 Zosyn 08/14/2021 08/20/2019 Flagyl 1 dose 08/22/2019   Subjective: Patient is awake in bed trying to eat breakfast being fed  Objective: Vitals:   09/02/19 0400 09/02/19 0730 09/02/19 1137 09/02/19 1451  BP: 130/84 (!) 127/93 125/84 117/73  Pulse: 87 89 90 97  Resp:   14   Temp:  (!) 97.5 F (36.4 C) 98.2 F (36.8 C)   TempSrc:  Oral Axillary   SpO2: 100% 100% 100%   Weight:      Height:        Intake/Output Summary (Last 24 hours) at 09/02/2019 1505 Last data filed at 09/02/2019 1305 Gross per 24 hour  Intake 2161.03 ml  Output 4100 ml  Net -1938.97 ml   Filed Weights   08/26/19 0500 09/01/19 0410 09/02/19 0317  Weight: 91.9 kg 90.4 kg 89.4 kg    Examination: core Track tube in place  General exam: Appears calm and comfortable  Respiratory system: Clear to auscultation. Respiratory effort normal. Cardiovascular system: S1 & S2 heard, RRR. No JVD, murmurs, rubs, gallops or clicks. No pedal edema. Gastrointestinal system: Abdomen is nondistended, soft and nontender. No organomegaly or masses felt. Normal bowel sounds heard. Central nervous system: Awake left upper and lower extremity hemiparesis  extremities: No edema  skin: No rashes, lesions or ulcers  Data Reviewed: I have personally reviewed following labs and imaging studies  CBC: Recent Labs  Lab 08/27/19 0430 08/29/19 0706 08/31/19 0500  09/02/19 0529  WBC 7.9 7.0 6.1 6.2  HGB 11.0* 11.6* 11.8* 11.2*  HCT 34.2* 34.7* 35.4* 33.5*  MCV 86.4 85.3 86.3 86.3  PLT 299 272 285 [id number]   Basic Metabolic Panel: Recent Labs  Lab 08/27/19 0430 08/29/19 0706 08/31/19 0500 09/02/19 0529 09/02/19 0812  NA 135 137 137 139  --   K 3.6 3.6 3.5 3.2*  --   CL 102 101 102 105  --   CO2 24 26 25 23   --   GLUCOSE 117* 127* 133* 104*  --   BUN 13 14 12 11   --   CREATININE 0.65 0.64 0.56* 0.61  --   CALCIUM 8.7* 9.1 8.8* 8.6*  --   MG  --   --   --   --  1.9   GFR: Estimated Creatinine Clearance: 129.4 mL/min (by C-G formula based on SCr of 0.61 mg/dL). Liver Function Tests: Recent Labs  Lab 09/02/19 0529  AST 34  ALT 75*  ALKPHOS 111  BILITOT 0.9  PROT 6.2*  ALBUMIN 2.7*   No results for input(s): LIPASE, AMYLASE in the last 168 hours. No results for input(s): AMMONIA in the last 168 hours. Coagulation Profile: No results for input(s): INR, PROTIME in the last 168 hours. Cardiac Enzymes: No results for input(s): CKTOTAL, CKMB, CKMBINDEX, TROPONINI in the last 168 hours. BNP (last 3 results) No results for input(s): PROBNP in the last 8760 hours. HbA1C: No results for input(s): HGBA1C in the last 72 hours. CBG: Recent Labs  Lab 09/01/19 2022 09/02/19 0021 09/02/19 0454 09/02/19 0827 09/02/19 1135  GLUCAP 102* 106* 90  105* 109*   Lipid Profile: No results for input(s): CHOL, HDL, LDLCALC, TRIG, CHOLHDL, LDLDIRECT in the last 72 hours. Thyroid Function Tests: No results for input(s): TSH, T4TOTAL, FREET4, T3FREE, THYROIDAB in the last 72 hours. Anemia Panel: No results for input(s): VITAMINB12, FOLATE, FERRITIN, TIBC, IRON, RETICCTPCT in the last 72 hours. Sepsis Labs: No results for input(s): PROCALCITON, LATICACIDVEN in the last 168 hours.  No results found for this or any previous visit (from the past 240 hour(s)).       Radiology Studies: No results found.      Scheduled Meds:  acetaminophen   650 mg Oral Q4H   Or   acetaminophen (TYLENOL) oral liquid 160 mg/5 mL  650 mg Per Tube Q4H   Or   acetaminophen  650 mg Rectal Q4H   chlorhexidine  15 mL Mouth Rinse BID   Chlorhexidine Gluconate Cloth  6 each Topical Daily   famotidine  20 mg Per Tube BID   feeding supplement (ENSURE ENLIVE)  237 mL Oral BID BM   heparin injection (subcutaneous)  5,000 Units Subcutaneous Q8H   levETIRAcetam  500 mg Per Tube BID   mouth rinse  15 mL Mouth Rinse q12n4p   metoprolol tartrate  50 mg Per Tube Q8H   sodium chloride flush  10-40 mL Intracatheter Q12H   Continuous Infusions:  sodium chloride 40 mL/hr at 09/02/19 0400   sodium chloride Stopped (08/21/19 2124)   sodium chloride Stopped (08/23/19 0945)   ceFEPime (MAXIPIME) IV 2 g (09/02/19 1233)   vancomycin 1,500 mg (09/02/19 1450)     LOS: 45 days     Georgette Shell, MD [facility]  If 7PM-7AM, please contact night-coverage [email or web address] Password [email or web address] 09/02/2019, 3:05 PM

## 2019-09-03 LAB — GLUCOSE, CAPILLARY
Glucose-Capillary: 115 mg/dL — ABNORMAL HIGH (ref 70–99)
Glucose-Capillary: 87 mg/dL (ref 70–99)
Glucose-Capillary: 88 mg/dL (ref 70–99)
Glucose-Capillary: 90 mg/dL (ref 70–99)
Glucose-Capillary: 97 mg/dL (ref 70–99)
Glucose-Capillary: 98 mg/dL (ref 70–99)

## 2019-09-03 NOTE — Progress Notes (Addendum)
PROGRESS NOTE    AARAF PLAZA  [id number] DOB: Jan 16, 1976 DOA: 07/19/2019 PCP: Colon Branch, MD    Brief Narrative: 43 year old gentleman with no significant past medical history who was found down x2 days nonverbal with left hemiparesis. 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 Patient status post hemicraniectomy with abdominal flap implant. Patient subsequently underwent a hemorrhagic conversion with hematoma 07/30/2019 which worsened on 9/20 with right infarct area. Patient was seen and followed by neurology in the ICU. Patient subsequently underwent right decompressive hemicraniectomy by Dr. Kathyrn Sheriff with flap on 07/19/2019. 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 Patient underwent aspiration on 08/21/2019 per Dr. Kathyrn Sheriff and cultures sent. Patient noted to be not a surgical candidate due to increasing herniation. ID was consulted and patient started empirically on IV vancomycin IV cefepime. Patient also noted to have a bilateral lower extremity DVT and small left lower lobe PE per venous Dopplers and CT angiogram. Heparin was started on 07/26/2019. IVC filter placed 07/21/2019 with plan retrieval 8 to 12 weeks if patient still hospitalized or in the outpatient IR clinic. Hypercoagulable work-up done was negative. Oncology consulted. Patient was on IV heparin however subsequently discontinued due to intracranial hemorrhage and then subsequently resumed given extensive DVT and PE but then subsequently discontinued due to worsening hemorrhage on 08/14/2019. Patient also noted to have abdominal wall hematoma. Patient also noted to  have some seizure-like activity subsequently placed on seizure precautions and on Keppra. Patient status post treatment of Pseudomonas UTI with Zosyn. Noted with dysphagia with speech therapy following. Patient subsequently transferred to tried service on 08/25/2019 with neurology following.  09/01/2019-patient awake follows simple commands good eye contact Nodding head yes and no  09/02/2019-staff feeding the patient he is awake alert and swallowing remains aphasic moves right upper and lower extremities  10/10-no new events overnight  Assessment & Plan:   Active Problems:   Stroke (cerebrum) (HCC)   Pressure injury of skin   Acute respiratory failure with hypoxemia (HCC)   Endotracheal tube present   Deep vein thrombosis (DVT) of non-extremity vein   Hypokalemia   FUO (fever of unknown origin)   Acute blood loss anemia   SIRS (systemic inflammatory response syndrome) (HCC)   Leukocytosis   Primary hypercoagulable state ([place])   Acute pulmonary embolism without acute cor pulmonale (HCC)   Altered mental status   Cerebral abscess   Urinary tract infection without hematuria   #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 Questionable etiology per neurology concerned that patient may have had a possible right ICA dissection from exercise. Patient noted to have hemorrhagic conversion with hematoma 07/30/2019 with worsening on 08/14/2019 in the right infarct. Patient followed by neurology throughout the hospitalization. Patient had head CT done that showed a large right brain infarct with edema right ACA and MCA territories. MRI showed large right brain infarct with cytotoxic edema but no hemorrhage. MRA head and neck showed right ICA occluded at origin. Right ICA terminus, R ACA, R MCA occluded. Head CT which was done showed 5 to 6 mm midline shift with  no hemorrhage. Patient  underwent acute head CT that showed progression with swelling decompressive craniectomy defect. CTA head and neck was done that showed right ICA occlusion neck that continues into right ACA and MCA. CT head done 07/23/2019 showed unchanged appearance of massive right MCA and ACA territory infarct with parenchyma extending through decompressive craniectomy. Small focus of suspected hemorrhage adjacent to right caudate head. CT head done 07/30/2019 with new hematoma within infarcted tissue. CT head done 08/01/2019 with no significant change. CT head done 08/11/2019 with new patchy hemorrhage along periphery of infarcted right frontal parietal lobe, decreased dominant anterior margin hemorrhage. Stable herniation of infarcted brain through craniectomy site. CT head done 08/14/2019 with worsening with increased hemorrhagic transformation on the right and new IVH. CT head 08/15/2019 slightly worsened hemorrhagic transformation from previous. CT head done 08/16/2019 with hemorrhagic right ACA and MCA territory infarct with edematous brain bulging through Crani defect. CT head 08/18/2019 with stable hematoma, increased right hemisphere mass-effect, external herniation from Crani site, with market right lateral ventriculomegaly than prior images. 2D echo done with a EF of 60 to 65% of emboli. 2D echo with bubble study ordered was negative for PFO. Lower extremity Dopplers done showed bilateral DVT. 2D echo with bubble study ordered was negative for PFO. Lower extremity Dopplers done showed bilateral DVT.  Patient was anticoagulated with IV heparin due to extensive DVT and PE but this was stopped due to worsening hemorrhagic transformation.  His wife does not want him to go to a skilled nursing facility she wants him to go to CIR.  CIR to reevaluate him again on Monday for appropriateness.  #2 cytotoxic cerebral edema-Status post right decompressive hemicraniectomy per Dr. Kathyrn Sheriff with right flap  07/19/2019.Patient has had multiple CT scans as a follow-up. The last CT scan was done 08/18/2019 with stable hematoma increased right hemisphere mass-effect external herniation from the [place] site.  No surgery planned   #3 cerebral abscess/empyema/cerebritis status post aspiration and cultures with no growth to date. Due to increasing herniation patient was thought not to be a surgical candidate. Patient was started on Vanco and cefepime on 08/20/2019 with duration of therapy 6 to 8 weeks per infectious disease.  Patient will need follow-up CT scan prior to discontinuation of IV antibiotics and notify infectious disease prior to discharge and will need follow-up outpatient ID.    #4 bilateral lower extremity DVT and small left lower lobe pulmonary embolism-patient treated with IV heparin which was subsequently stopped due to increasing hemorrhagic transformation/intracranial hemorrhage.  Patient has an IVC filter placed 07/21/2019.  This will need to be retrieved in 8 to 12 weeks per IR.  He will need outpatient follow-up if he is not in the hospital by then.  This is going to be sometime by the end of October.    #5?Seizure-like activity continue Keppra EEG showed cortical dysfunction in the right frontal region. No seizure activity reported by the staff.  #6 abdominal wall hematoma status post aspirationno growth. #7 Pseudomonas UTIpatient received a course of Zosyn.  #8 dysphagia due to acute stroke-patient eating more than 75%. Tube feeds were stopped 09/01/2019     Nutrition Problem: Inadequate oral intake Etiology: acute illness     Signs/Symptoms: NPO status    Interventions: Tube feeding, Prostat, Ensure Enlive (each supplement provides 350kcal and 20 grams of protein)  Estimated body mass index is 27.43 kg/m as calculated from the following:   Height as of this encounter: 5\' 11"  (1.803 m).   Weight  as of this encounter: 89.2 kg.  DVT prophylaxis:SCD Code  Status:Full code  family Communication:None at bedside, attempted to call his wife with no response Disposition Plan:SNF once patient is tolerating oral intake to please have been weaned off Consultants:Infectious disease, oncology, palliative care, CIR, neurosurgery, PCCM   Procedures:- CT head 08/20/2019, 08/18/2019, 08/16/2019, 08/14/2019, 08/11/2019, 08/01/2019, 07/30/2019, 07/23/2019 CT angios head and neck 07/21/2019 CT angiogram chest 08/01/2019 CT abdomen and pelvis 08/04/2019 Abdominal ultrasound 08/11/2019 2D echo 07/20/2019,  2D echo bubble study 08/03/2019 Lower extremity Dopplers 07/21/2019, 08/02/2019  MRI head 08/20/2019, 07/19/2019 Modified barium swallow 08/25/2019 MRA head and neck 07/19/2019 Antimicrobials:Vanco cefepime started 08/20/2019 Zosyn 08/14/2021 08/20/2019 Flagyl 1 dose 08/22/2019   Subjective:  Resting in bed .no events overnight Objective: Vitals:   09/02/19 2348 09/03/19 0405 09/03/19 0500 09/03/19 0843  BP:  130/89 (!) 124/97 (!) 123/95  Pulse:  85 87 79  Resp: 16   16  Temp: 98.5 F (36.9 C) 98.6 F (37 C)  99 F (37.2 C)  TempSrc: Oral Axillary  Oral  SpO2:    97%  Weight:   89.2 kg   Height:        Intake/Output Summary (Last 24 hours) at 09/03/2019 1005 Last data filed at 09/03/2019 0851 Gross per 24 hour  Intake 1161.84 ml  Output 8300 ml  Net -7138.16 ml   Filed Weights   09/01/19 0410 09/02/19 0317 09/03/19 0500  Weight: 90.4 kg 89.4 kg 89.2 kg    Examination:  General exam: Appears calm and comfortable  Respiratory system: Clear to auscultation. Respiratory effort normal. Cardiovascular system: S1 & S2 heard, RRR. No JVD, murmurs, rubs, gallops or clicks. No pedal edema. Gastrointestinal system: Abdomen is nondistended, soft and nontender. No organomegaly or masses felt. Normal bowel sounds heard. Central nervous system:left upper and lower extremity hemiparesis Extremities: No edema Skin: No rashes, lesions or ulcers   Data  Reviewed: I have personally reviewed following labs and imaging studies  CBC: Recent Labs  Lab 08/29/19 0706 08/31/19 0500 09/02/19 0529  WBC 7.0 6.1 6.2  HGB 11.6* 11.8* 11.2*  HCT 34.7* 35.4* 33.5*  MCV 85.3 86.3 86.3  PLT 272 285 [id number]   Basic Metabolic Panel: Recent Labs  Lab 08/29/19 0706 08/31/19 0500 09/02/19 0529 09/02/19 0812  NA 137 137 139  --   K 3.6 3.5 3.2*  --   CL 101 102 105  --   CO2 26 25 23   --   GLUCOSE 127* 133* 104*  --   BUN 14 12 11   --   CREATININE 0.64 0.56* 0.61  --   CALCIUM 9.1 8.8* 8.6*  --   MG  --   --   --  1.9   GFR: Estimated Creatinine Clearance: 129.4 mL/min (by C-G formula based on SCr of 0.61 mg/dL). Liver Function Tests: Recent Labs  Lab 09/02/19 0529  AST 34  ALT 75*  ALKPHOS 111  BILITOT 0.9  PROT 6.2*  ALBUMIN 2.7*   No results for input(s): LIPASE, AMYLASE in the last 168 hours. No results for input(s): AMMONIA in the last 168 hours. Coagulation Profile: No results for input(s): INR, PROTIME in the last 168 hours. Cardiac Enzymes: No results for input(s): CKTOTAL, CKMB, CKMBINDEX, TROPONINI in the last 168 hours. BNP (last 3 results) No results for input(s): PROBNP in the last 8760 hours. HbA1C: No results for input(s): HGBA1C in the last 72 hours. CBG: Recent Labs  Lab 09/02/19 1618 09/02/19 2032 09/03/19 0025 09/03/19  0410 09/03/19 0842  GLUCAP 101* 74 98 90 88   Lipid Profile: No results for input(s): CHOL, HDL, LDLCALC, TRIG, CHOLHDL, LDLDIRECT in the last 72 hours. Thyroid Function Tests: No results for input(s): TSH, T4TOTAL, FREET4, T3FREE, THYROIDAB in the last 72 hours. Anemia Panel: No results for input(s): VITAMINB12, FOLATE, FERRITIN, TIBC, IRON, RETICCTPCT in the last 72 hours. Sepsis Labs: No results for input(s): PROCALCITON, LATICACIDVEN in the last 168 hours.  No results found for this or any previous visit (from the past 240 hour(s)).       Radiology Studies: No results  found.      Scheduled Meds:  acetaminophen  650 mg Oral Q4H   Or   acetaminophen (TYLENOL) oral liquid 160 mg/5 mL  650 mg Per Tube Q4H   Or   acetaminophen  650 mg Rectal Q4H   chlorhexidine  15 mL Mouth Rinse BID   Chlorhexidine Gluconate Cloth  6 each Topical Daily   famotidine  20 mg Per Tube BID   feeding supplement (ENSURE ENLIVE)  237 mL Oral TID BM   heparin injection (subcutaneous)  5,000 Units Subcutaneous Q8H   levETIRAcetam  500 mg Per Tube BID   mouth rinse  15 mL Mouth Rinse q12n4p   metoprolol tartrate  50 mg Per Tube Q8H   sodium chloride flush  10-40 mL Intracatheter Q12H   Continuous Infusions:  sodium chloride 40 mL/hr at 09/02/19 2301   sodium chloride Stopped (08/21/19 2124)   sodium chloride Stopped (08/23/19 0945)   ceFEPime (MAXIPIME) IV 2 g (09/03/19 0915)   vancomycin 1,500 mg (09/03/19 0508)     LOS: 99 days     Georgette Shell, MD [facility]  If 7PM-7AM, please contact night-coverage [email or web address] Password [email or web address] 09/03/2019, 10:05 AM

## 2019-09-04 LAB — COMPREHENSIVE METABOLIC PANEL
ALT: 64 U/L — ABNORMAL HIGH (ref 0–44)
AST: 26 U/L (ref 15–41)
Albumin: 2.7 g/dL — ABNORMAL LOW (ref 3.5–5.0)
Alkaline Phosphatase: 116 U/L (ref 38–126)
Anion gap: 10 (ref 5–15)
BUN: 6 mg/dL (ref 6–20)
CO2: 24 mmol/L (ref 22–32)
Calcium: 8.9 mg/dL (ref 8.9–10.3)
Chloride: 103 mmol/L (ref 98–111)
Creatinine, Ser: 0.6 mg/dL — ABNORMAL LOW (ref 0.61–1.24)
GFR calc Af Amer: >60 mL/min (ref >60)
GFR calc non Af Amer: >60 mL/min (ref >60)
Glucose, Bld: 106 mg/dL — ABNORMAL HIGH (ref 70–99)
Potassium: 3.2 mmol/L — ABNORMAL LOW (ref 3.5–5.1)
Sodium: 137 mmol/L (ref 135–145)
Total Bilirubin: 0.7 mg/dL (ref 0.3–1.2)
Total Protein: 6.3 g/dL — ABNORMAL LOW (ref 6.5–8.1)

## 2019-09-04 LAB — GLUCOSE, CAPILLARY
Glucose-Capillary: 103 mg/dL — ABNORMAL HIGH (ref 70–99)
Glucose-Capillary: 118 mg/dL — ABNORMAL HIGH (ref 70–99)
Glucose-Capillary: 69 mg/dL — ABNORMAL LOW (ref 70–99)
Glucose-Capillary: 71 mg/dL (ref 70–99)
Glucose-Capillary: 85 mg/dL (ref 70–99)
Glucose-Capillary: 86 mg/dL (ref 70–99)
Glucose-Capillary: 87 mg/dL (ref 70–99)
Glucose-Capillary: 88 mg/dL (ref 70–99)

## 2019-09-04 LAB — MAGNESIUM: Magnesium: 1.6 mg/dL — ABNORMAL LOW (ref 1.7–2.4)

## 2019-09-04 MED ORDER — MAGNESIUM SULFATE 4 GM/100ML IV SOLN
4.0000 g | Freq: Once | INTRAVENOUS | Status: AC
  Administered 2019-09-04: 10:00:00 4 g via INTRAVENOUS
  Filled 2019-09-04: qty 100

## 2019-09-04 MED ORDER — POTASSIUM CHLORIDE 10 MEQ/100ML IV SOLN
10.0000 meq | INTRAVENOUS | Status: AC
  Administered 2019-09-04 (×4): 10 meq via INTRAVENOUS
  Filled 2019-09-04 (×4): qty 100

## 2019-09-04 NOTE — Progress Notes (Signed)
PROGRESS NOTE    Matthew Black  [id number] DOB: 09-20-1976 DOA: 07/19/2019 PCP: Colon Branch, MD  Brief Narrative: 43 year old gentleman with no significant past medical history who was found down x2 days nonverbal with left hemiparesis. 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 Patient status post hemicraniectomy with abdominal flap implant. Patient subsequently underwent a hemorrhagic conversion with hematoma 07/30/2019 which worsened on 9/20 with right infarct area. Patient was seen and followed by neurology in the ICU. Patient subsequently underwent right decompressive hemicraniectomy by Dr. Kathyrn Sheriff with flap on 07/19/2019. 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 Patient underwent aspiration on 08/21/2019 per Dr. Kathyrn Sheriff and cultures sent. Patient noted to be not a surgical candidate due to increasing herniation. ID was consulted and patient started empirically on IV vancomycin IV cefepime. Patient also noted to have a bilateral lower extremity DVT and small left lower lobe PE per venous Dopplers and CT angiogram. Heparin was started on 07/26/2019. IVC filter placed 07/21/2019 with plan retrieval 8 to 12 weeks if patient still hospitalized or in the outpatient IR clinic. Hypercoagulable work-up done was negative. Oncology consulted. Patient was on IV heparin however subsequently discontinued due to intracranial hemorrhage and then subsequently resumed given extensive DVT and PE but then subsequently discontinued due to worsening hemorrhage on 08/14/2019. Patient also noted to have abdominal wall hematoma. Patient also noted to  have some seizure-like activity subsequently placed on seizure precautions and on Keppra. Patient status post treatment of Pseudomonas UTI with Zosyn. Noted with dysphagia with speech therapy following. Patient subsequently transferred to tried service on 08/25/2019 with neurology following.  09/01/2019-patient awake follows simple commands good eye contact Nodding head yes and no  09/02/2019-staff feeding the patient he is awake alert and swallowing remains aphasic moves right upper and lower extremities  10/10-no new events overnight   Assessment & Plan:   Active Problems:   Stroke (cerebrum) (HCC)   Pressure injury of skin   Acute respiratory failure with hypoxemia (HCC)   Endotracheal tube present   Deep vein thrombosis (DVT) of non-extremity vein   Hypokalemia   FUO (fever of unknown origin)   Acute blood loss anemia   SIRS (systemic inflammatory response syndrome) (HCC)   Leukocytosis   Primary hypercoagulable state ([place])   Acute pulmonary embolism without acute cor pulmonale (HCC)   Altered mental status   Cerebral abscess   Urinary tract infection without hematuria   #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 Questionable etiology per neurology concerned that patient may have had a possible right ICA dissection from exercise. Patient noted to have hemorrhagic conversion with hematoma 07/30/2019 with worsening on 08/14/2019 in the right infarct. Patient followed by neurology throughout the hospitalization. Patient had head CT done that showed a large right brain infarct with edema right ACA and MCA territories. MRI showed large right brain infarct with cytotoxic edema but no hemorrhage. MRA head and neck showed right ICA occluded at origin. Right ICA terminus, R ACA, R MCA occluded. Head CT which was done showed 5 to 6 mm midline shift with no  hemorrhage. Patient  underwent acute head CT that showed progression with swelling decompressive craniectomy defect. CTA head and neck was done that showed right ICA occlusion neck that continues into right ACA and MCA. CT head done 07/23/2019 showed unchanged appearance of massive right MCA and ACA territory infarct with parenchyma extending through decompressive craniectomy. Small focus of suspected hemorrhage adjacent to right caudate head. CT head done 07/30/2019 with new hematoma within infarcted tissue. CT head done 08/01/2019 with no significant change. CT head done 08/11/2019 with new patchy hemorrhage along periphery of infarcted right frontal parietal lobe, decreased dominant anterior margin hemorrhage. Stable herniation of infarcted brain through craniectomy site. CT head done 08/14/2019 with worsening with increased hemorrhagic transformation on the right and new IVH. CT head 08/15/2019 slightly worsened hemorrhagic transformation from previous. CT head done 08/16/2019 with hemorrhagic right ACA and MCA territory infarct with edematous brain bulging through Crani defect. CT head 08/18/2019 with stable hematoma, increased right hemisphere mass-effect, external herniation from Crani site, with market right lateral ventriculomegaly than prior images. 2D echo done with a EF of 60 to 65% of emboli. 2D echo with bubble study ordered was negative for PFO. Lower extremity Dopplers done showed bilateral DVT.  2D echo with bubble study ordered was negative for PFO. Lower extremity Dopplers done showed bilateral DVT.  Patient was anticoagulated with IV heparin due to extensive DVT and PE but this was stopped due to worsening hemorrhagic transformation.  His wife does not want him to go to a skilled nursing facility she wants him to go to CIR.  CIR to reevaluate him again on Monday for appropriateness.  #2 cytotoxic cerebral edema-Status post right decompressive hemicraniectomy per Dr. Kathyrn Sheriff with right flap  07/19/2019.Patient has had multiple CT scans as a follow-up. The last CT scan was done 08/18/2019 with stable hematoma increased right hemisphere mass-effect external herniation from the [place] site.No surgery planned   #3 cerebral abscess/empyema/cerebritis status post aspiration and cultures with no growth to date. Due to increasing herniation patient was thought not to be a surgical candidate. Patient was started on Vanco and cefepime on 08/20/2019 with duration of therapy 6 to 8 weeks per infectious disease.Patient will need follow-up CT scan prior to discontinuation of IV antibiotics and notify infectious disease prior to discharge and will need follow-up outpatient ID.   #4 bilateral lower extremity DVT and small left lower lobe pulmonary embolism-patient treated with IV heparin which was subsequently stopped due to increasing hemorrhagic transformation/intracranial hemorrhage.  Patient has an IVC filter placed 07/21/2019.  This will need to be retrieved in 8 to 12 weeks per IR.  He will need outpatient follow-up if he is not in the hospital by then.  This is going to be sometime by the end of October.    #5?Seizure-like activity continue Keppra EEG showed cortical dysfunction in the right frontal region. No seizure activity reported by the staff.  #6 abdominal wall hematoma status post aspirationno growth. #7 Pseudomonas UTIpatient received a course of Zosyn.  #8 dysphagia due to acute stroke-patient eating more than 75%. Tube feeds were stopped 09/01/2019   Nutrition Problem: Inadequate oral intake Etiology: acute illness     Signs/Symptoms: NPO status    Interventions: Tube feeding, Prostat, Ensure Enlive (each supplement provides 350kcal and 20 grams of protein)  Estimated body mass index is 27.73 kg/m as calculated from the following:   Height as of this encounter: 5\' 11"  (1.803 m).   Weight as of this encounter: 90.2 kg.  DVT prophylaxis:SCD Code  Status:Full code  family Communication:None at bedside, attempted to call his wife with no response Disposition Plan:SNF once patient is tolerating oral intake to please have been weaned off Consultants:Infectious disease, oncology, palliative care, CIR, neurosurgery, PCCM   Procedures:- CT head 08/20/2019, 08/18/2019, 08/16/2019, 08/14/2019, 08/11/2019, 08/01/2019, 07/30/2019, 07/23/2019 CT angios head and neck 07/21/2019 CT angiogram chest 08/01/2019 CT abdomen and pelvis 08/04/2019 Abdominal ultrasound 08/11/2019 2D echo 07/20/2019,  2D echo bubble study 08/03/2019 Lower extremity Dopplers 07/21/2019, 08/02/2019  MRI head 08/20/2019, 07/19/2019 Modified barium swallow 08/25/2019 MRA head and neck 07/19/2019 Antimicrobials:Vanco cefepime started 08/20/2019 Zosyn 08/14/2021 08/20/2019 Flagyl 1 dose 08/22/2019   Subjective: Staff reports patient eating most of the food he is awake about to start eating breakfast.  Objective: Vitals:   09/04/19 0500 09/04/19 0552 09/04/19 0812 09/04/19 1223  BP:  (!) 135/59 (!) 128/98 121/90  Pulse:  82 80 81  Resp:   18 20  Temp:   98.6 F (37 C) 97.9 F (36.6 C)  TempSrc:   Oral Oral  SpO2:   100%   Weight: 90.2 kg     Height:        Intake/Output Summary (Last 24 hours) at 09/04/2019 1415 Last data filed at 09/04/2019 0900 Gross per 24 hour  Intake 250 ml  Output 3050 ml  Net -2800 ml   Filed Weights   09/02/19 0317 09/03/19 0500 09/04/19 0500  Weight: 89.4 kg 89.2 kg 90.2 kg    Examination: Core tract tube in place. General exam: Appears calm and comfortable  Respiratory system: Clear to auscultation. Respiratory effort normal. Cardiovascular system: S1 & S2 heard, RRR. No JVD, murmurs, rubs, gallops or clicks. No pedal edema. Gastrointestinal system: Abdomen is nondistended, soft and nontender. No organomegaly or masses felt. Normal bowel sounds heard. Central nervous system: Awake left upper and lower extremity hemiparesis Extremities:  Symmetric 5 x 5 power. Skin: No rashes, lesions or ulcers Psychiatry: Judgement and insight appear normal. Mood & affect appropriate.     Data Reviewed: I have personally reviewed following labs and imaging studies  CBC: Recent Labs  Lab 08/29/19 0706 08/31/19 0500 09/02/19 0529  WBC 7.0 6.1 6.2  HGB 11.6* 11.8* 11.2*  HCT 34.7* 35.4* 33.5*  MCV 85.3 86.3 86.3  PLT 272 285 [id number]   Basic Metabolic Panel: Recent Labs  Lab 08/29/19 0706 08/31/19 0500 09/02/19 0529 09/02/19 0812 09/04/19 0555  NA 137 137 139  --  137  K 3.6 3.5 3.2*  --  3.2*  CL 101 102 105  --  103  CO2 26 25 23   --  24  GLUCOSE 127* 133* 104*  --  106*  BUN 14 12 11   --  6  CREATININE 0.64 0.56* 0.61  --  0.60*  CALCIUM 9.1 8.8* 8.6*  --  8.9  MG  --   --   --  1.9 1.6*   GFR: Estimated Creatinine Clearance: 129.4 mL/min (A) (by C-G formula based on SCr of 0.6 mg/dL (L)). Liver Function Tests: Recent Labs  Lab 09/02/19 0529 09/04/19 0555  AST 34 26  ALT 75* 64*  ALKPHOS 111 116  BILITOT 0.9 0.7  PROT 6.2* 6.3*  ALBUMIN 2.7* 2.7*   No results for input(s): LIPASE, AMYLASE in the last 168 hours. No results for input(s): AMMONIA in the last 168 hours. Coagulation Profile: No results for input(s): INR, PROTIME in the last 168 hours. Cardiac Enzymes: No results for input(s): CKTOTAL, CKMB, CKMBINDEX, TROPONINI  in the last 168 hours. BNP (last 3 results) No results for input(s): PROBNP in the last 8760 hours. HbA1C: No results for input(s): HGBA1C in the last 72 hours. CBG: Recent Labs  Lab 09/04/19 0027 09/04/19 0133 09/04/19 0339 09/04/19 0808 09/04/19 1221  GLUCAP 69* 88 87 85 103*   Lipid Profile: No results for input(s): CHOL, HDL, LDLCALC, TRIG, CHOLHDL, LDLDIRECT in the last 72 hours. Thyroid Function Tests: No results for input(s): TSH, T4TOTAL, FREET4, T3FREE, THYROIDAB in the last 72 hours. Anemia Panel: No results for input(s): VITAMINB12, FOLATE, FERRITIN, TIBC, IRON,  RETICCTPCT in the last 72 hours. Sepsis Labs: No results for input(s): PROCALCITON, LATICACIDVEN in the last 168 hours.  No results found for this or any previous visit (from the past 240 hour(s)).       Radiology Studies: No results found.      Scheduled Meds:  acetaminophen  650 mg Oral Q4H   Or   acetaminophen (TYLENOL) oral liquid 160 mg/5 mL  650 mg Per Tube Q4H   Or   acetaminophen  650 mg Rectal Q4H   chlorhexidine  15 mL Mouth Rinse BID   Chlorhexidine Gluconate Cloth  6 each Topical Daily   famotidine  20 mg Per Tube BID   feeding supplement (ENSURE ENLIVE)  237 mL Oral TID BM   heparin injection (subcutaneous)  5,000 Units Subcutaneous Q8H   levETIRAcetam  500 mg Per Tube BID   mouth rinse  15 mL Mouth Rinse q12n4p   metoprolol tartrate  50 mg Per Tube Q8H   sodium chloride flush  10-40 mL Intracatheter Q12H   Continuous Infusions:  sodium chloride 40 mL/hr at 09/04/19 0143   sodium chloride Stopped (08/21/19 2124)   sodium chloride Stopped (08/23/19 0945)   ceFEPime (MAXIPIME) IV 2 g (09/04/19 0933)   vancomycin 1,500 mg (09/04/19 0553)     LOS: 28 days     Georgette Shell, MD [facility]  If 7PM-7AM, please contact night-coverage [email or web address] Password [email or web address] 09/04/2019, 2:15 PM

## 2019-09-04 NOTE — Progress Notes (Signed)
Per patient's wife, patient complained of pressure like pain on his head. PRN pain medication administered. Patient's wife called back around 25, to check on how patient is doing, and to make sure doctor is informed about her concerns, and requested to know if MD can order CT Scan of the head. Will pass this information on to the day shift nurse.

## 2019-09-05 LAB — GLUCOSE, CAPILLARY
Glucose-Capillary: 107 mg/dL — ABNORMAL HIGH (ref 70–99)
Glucose-Capillary: 111 mg/dL — ABNORMAL HIGH (ref 70–99)
Glucose-Capillary: 117 mg/dL — ABNORMAL HIGH (ref 70–99)
Glucose-Capillary: 79 mg/dL (ref 70–99)
Glucose-Capillary: 94 mg/dL (ref 70–99)

## 2019-09-05 LAB — BASIC METABOLIC PANEL
Anion gap: 9 (ref 5–15)
BUN: 6 mg/dL (ref 6–20)
CO2: 26 mmol/L (ref 22–32)
Calcium: 9 mg/dL (ref 8.9–10.3)
Chloride: 102 mmol/L (ref 98–111)
Creatinine, Ser: 0.58 mg/dL — ABNORMAL LOW (ref 0.61–1.24)
GFR calc Af Amer: >60 mL/min (ref >60)
GFR calc non Af Amer: >60 mL/min (ref >60)
Glucose, Bld: 100 mg/dL — ABNORMAL HIGH (ref 70–99)
Potassium: 3.6 mmol/L (ref 3.5–5.1)
Sodium: 137 mmol/L (ref 135–145)

## 2019-09-05 LAB — CBC
HCT: 34.9 % — ABNORMAL LOW (ref 39.0–52.0)
Hemoglobin: 11.6 g/dL — ABNORMAL LOW (ref 13.0–17.0)
MCH: 28 pg (ref 26.0–34.0)
MCHC: 33.2 g/dL (ref 30.0–36.0)
MCV: 84.3 fL (ref 80.0–100.0)
Platelets: 242 10*3/uL (ref 150–400)
RBC: 4.14 MIL/uL — ABNORMAL LOW (ref 4.22–5.81)
RDW: 14.7 % (ref 11.5–15.5)
WBC: 5.5 10*3/uL (ref 4.0–10.5)
nRBC: 0 % (ref 0.0–0.2)

## 2019-09-05 MED ORDER — FAMOTIDINE 20 MG PO TABS
20.0000 mg | ORAL_TABLET | Freq: Two times a day (BID) | ORAL | Status: DC
  Administered 2019-09-05 – 2019-09-06 (×2): 20 mg via ORAL
  Filled 2019-09-05 (×2): qty 1

## 2019-09-05 MED ORDER — METOPROLOL TARTRATE 50 MG PO TABS
50.0000 mg | ORAL_TABLET | Freq: Three times a day (TID) | ORAL | Status: DC
  Administered 2019-09-05 – 2019-09-06 (×4): 50 mg via ORAL
  Filled 2019-09-05 (×4): qty 1

## 2019-09-05 MED ORDER — LEVETIRACETAM 500 MG PO TABS
500.0000 mg | ORAL_TABLET | Freq: Two times a day (BID) | ORAL | Status: DC
  Administered 2019-09-05 – 2019-09-06 (×2): 500 mg via ORAL
  Filled 2019-09-05 (×2): qty 1

## 2019-09-05 NOTE — Progress Notes (Signed)
PROGRESS NOTE    Matthew Black  [id number] DOB: 09/28/1976 DOA: 07/19/2019 PCP: Colon Branch, MD  Brief Narrative 43 year old gentleman with no significant past medical history who was found down x2 days nonverbal with left hemiparesis. 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 Patient status post hemicraniectomy with abdominal flap implant. Patient subsequently underwent a hemorrhagic conversion with hematoma 07/30/2019 which worsened on 9/20 with right infarct area. Patient was seen and followed by neurology in the ICU. Patient subsequently underwent right decompressive hemicraniectomy by Dr. Kathyrn Sheriff with flap on 07/19/2019. 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 Patient underwent aspiration on 08/21/2019 per Dr. Kathyrn Sheriff and cultures sent. Patient noted to be not a surgical candidate due to increasing herniation. ID was consulted and patient started empirically on IV vancomycin IV cefepime. Patient also noted to have a bilateral lower extremity DVT and small left lower lobe PE per venous Dopplers and CT angiogram. Heparin was started on 07/26/2019. IVC filter placed 07/21/2019 with plan retrieval 8 to 12 weeks if patient still hospitalized or in the outpatient IR clinic. Hypercoagulable work-up done was negative. Oncology consulted. Patient was on IV heparin however subsequently discontinued due to intracranial hemorrhage and then subsequently resumed given extensive DVT and PE but then subsequently discontinued due to worsening hemorrhage on 08/14/2019. Patient also noted to have abdominal wall hematoma. Patient also noted to have  some seizure-like activity subsequently placed on seizure precautions and on Keppra. Patient status post treatment of Pseudomonas UTI with Zosyn. Noted with dysphagia with speech therapy following. Patient subsequently transferred to tried service on 08/25/2019 with neurology following.  09/01/2019-patient awake follows simple commands good eye contact Nodding head yes and no  09/02/2019-staff feeding the patient he is awake alert and swallowing remains aphasic moves right upper and lower extremities  10/10-no new events overnight  09/05/2019 patient is eating almost all of his meals daily.  Wife comes in the evening feeds him at dinner and stays till 8:00 I was told by the staff.  She works during the day.  Staff noticed yellow drainage from his right side of his scalp.  He says it hurts.  When asked him whether it hurts he noted his head yes.  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 Questionable etiology per neurology concerned that patient may have had a possible right ICA dissection from exercise. Patient noted to have hemorrhagic conversion with hematoma 07/30/2019 with worsening on 08/14/2019 in the right infarct. Patient followed by neurology throughout  the hospitalization.  Neurology signed off  09/03/2019. Patient had head CT  done that showed a large right brain infarct with edema right ACA and MCA territories. MRI showed large right brain infarct with cytotoxic edema but no hemorrhage. MRA head and neck showed right ICA occluded at origin. Right ICA terminus, R ACA, R MCA occluded. Head CT which was done showed 5 to 6 mm midline shift with no hemorrhage. Patient underwent acute head CT that showed progression with swelling decompressive craniectomy defect. CTA head and neck was done that showed right ICA occlusion neck that continues into right ACA and MCA. CT head done 07/23/2019 showed unchanged appearance of massive right MCA and ACA territory infarct with parenchyma extending through decompressive craniectomy. Small focus of suspected hemorrhage adjacent to right caudate head. CT head done 07/30/2019 with new hematoma within infarcted tissue. CT head done 08/01/2019 with no significant change. CT head done 08/11/2019 with new patchy hemorrhage along periphery of infarcted right frontal parietal lobe, decreased dominant anterior margin hemorrhage. Stable herniation of infarcted brain through craniectomy site. CT head done 08/14/2019 with worsening with increased hemorrhagic transformation on the right and new IVH. CT head 08/15/2019 slightly worsened hemorrhagic transformation from previous. CT head done 08/16/2019 with hemorrhagic right ACA and MCA territory infarct with edematous brain bulging through Crani defect. CT head 08/18/2019 with stable hematoma, increased right hemisphere mass-effect, external herniation from Crani site, with market right lateral ventriculomegaly than prior images. 2D echo done with a EF of 60 to 65% of emboli. 2D echo with bubble study ordered was negative for PFO. Lower extremity Dopplers done showed bilateral DVT.    2D echo with bubble study ordered was negative for PFO. Lower extremity Dopplers done showed bilateral DVT.  Patient was anticoagulant coagulated with IV heparin due to  extensive DVT and PE this was stopped due to worsening hemorrhagic transformation.  His wife does not want him to go to a skilled nursing facility she wants him to go to CIR. CIR TO EVALUATE HIM AGAIN TODAY TO REASSESS APPROPRIATENESS FOR CIR ADMIT.   #2 cytotoxic cerebral edema-Status post right decompressive hemicraniectomy per Dr. Kathyrn Sheriff with right flap 07/19/2019.Patient has had multiple CT scans as a follow-up. The last CT scan was done 08/18/2019 with stable hematoma increased right hemisphere mass-effect external herniation from the [place] site.No surgery planned   #3 cerebral abscess/empyema/cerebritis status post aspiration and cultures with no growth to date. Due to increasing herniation patient was thought not to be a surgical candidate. Patient was started on Vanco and cefepime on 08/20/2019 with duration of therapy 6 to 8 weeks per infectious disease.Patient will need follow-up CT scan prior to discontinuation of IV antibiotics and notify infectious disease prior to discharge and will need follow-up outpatient ID.  Patient has drainage from the right side of her scalp yellow foul-smelling discharge which will be sent for culture today.  He is already on Vanco and cefepime.  #4 bilateral lower extremity DVT and small left lower lobe pulmonary embolism-patient treated with IV heparin which was subsequently stopped due to increasinghemorrhagictransformation/intracranial hemorrhage. Patient has an IVC filter placed 07/21/2019. This will need to be retrieved in 8 to 12 weeks per IR. He will need outpatient follow-up if he is not in the hospital by then. This is going to be sometime by the end of October.  #5?Seizure-like activity continue Keppra EEG showed cortical dysfunction in the right frontal region. No seizure activity reported by the staff.  #6 abdominal wall hematoma status  post aspirationno growth. #7 Pseudomonas UTIpatient received a course of Zosyn.  #8  dysphagia due to acute stroke-patient eating more than 75%. Tube feeds were stopped10/06/2019   Nutrition Problem: Inadequate oral intake Etiology: acute illness     Signs/Symptoms: NPO status    Interventions: Tube feeding, Prostat, Ensure Enlive (each supplement provides 350kcal and 20 grams of protein)  Estimated body mass index is 27.73 kg/m as calculated from the following:   Height as of this encounter: 5\' 11"  (1.803 m).   Weight as of this encounter: 90.2 kg.  DVT prophylaxis:SCD Code Status:Full code  family Communication:None at bedside, attempted to call his wife with no response Disposition Plan:SNF once patient is tolerating oral intake to please have been weaned off Consultants:Infectious disease, oncology, palliative care, CIR, neurosurgery, PCCM   Procedures:- CT head 08/20/2019, 08/18/2019, 08/16/2019, 08/14/2019, 08/11/2019, 08/01/2019, 07/30/2019, 07/23/2019 CT angios head and neck 07/21/2019 CT angiogram chest 08/01/2019 CT abdomen and pelvis 08/04/2019 Abdominal ultrasound 08/11/2019 2D echo 07/20/2019,  2D echo bubble study 08/03/2019 Lower extremity Dopplers 07/21/2019, 08/02/2019  MRI head 08/20/2019, 07/19/2019 Modified barium swallow 08/25/2019 MRA head and neck 07/19/2019 Antimicrobials:Vanco cefepime started 08/20/2019 Zosyn 08/14/2021 08/20/2019 Flagyl 1 dose 08/22/2019   Subjective: Patient is being fed by the staff he is awake alert he noted his head yes when I asked him to set her up on the scalp staff noticed a little drainage coming out of the right scalp incision.  Objective: Vitals:   09/05/19 0332 09/05/19 0500 09/05/19 0533 09/05/19 0739  BP: (!) 130/98  (!) 125/91 123/89  Pulse: 85  81 79  Resp: (!) 21   18  Temp: 98.5 F (36.9 C)   98.1 F (36.7 C)  TempSrc: Oral   Oral  SpO2: 100%   100%  Weight:  90.2 kg    Height:        Intake/Output Summary (Last 24 hours) at 09/05/2019 1053 Last data filed at 09/05/2019 0900 Gross per 24  hour  Intake 1660 ml  Output 3600 ml  Net -1940 ml   Filed Weights   09/03/19 0500 09/04/19 0500 09/05/19 0500  Weight: 89.2 kg 90.2 kg 90.2 kg    Examination: Right side of the scalp tender to touch with yellow drainage. General exam: Appears calm and comfortable  Respiratory system: Clear to auscultation. Respiratory effort normal. Cardiovascular system: S1 & S2 heard, RRR. No JVD, murmurs, rubs, gallops or clicks. No pedal edema. Gastrointestinal system: Abdomen is nondistended, soft and nontender. No organomegaly or masses felt. Normal bowel sounds heard. Central nervous system:awake nods head yes and no left upper and lower extremity hemiparesis Extremities: No edema     Data Reviewed: I have personally reviewed following labs and imaging studies  CBC: Recent Labs  Lab 08/31/19 0500 09/02/19 0529 09/05/19 0500  WBC 6.1 6.2 5.5  HGB 11.8* 11.2* 11.6*  HCT 35.4* 33.5* 34.9*  MCV 86.3 86.3 84.3  PLT 285 259 [id number]   Basic Metabolic Panel: Recent Labs  Lab 08/31/19 0500 09/02/19 0529 09/02/19 0812 09/04/19 0555 09/05/19 0500  NA 137 139  --  137 137  K 3.5 3.2*  --  3.2* 3.6  CL 102 105  --  103 102  CO2 25 23  --  24 26  GLUCOSE 133* 104*  --  106* 100*  BUN 12 11  --  6 6  CREATININE 0.56* 0.61  --  0.60* 0.58*  CALCIUM 8.8* 8.6*  --  8.9 9.0  MG  --   --  1.9 1.6*  --    GFR: Estimated Creatinine Clearance: 129.4 mL/min (A) (by C-G formula based on SCr of 0.58 mg/dL (L)). Liver Function Tests: Recent Labs  Lab 09/02/19 0529 09/04/19 0555  AST 34 26  ALT 75* 64*  ALKPHOS 111 116  BILITOT 0.9 0.7  PROT 6.2* 6.3*  ALBUMIN 2.7* 2.7*   No results for input(s): LIPASE, AMYLASE in the last 168 hours. No results for input(s): AMMONIA in the last 168 hours. Coagulation Profile: No results for input(s): INR, PROTIME in the last 168 hours. Cardiac Enzymes: No results for input(s): CKTOTAL, CKMB, CKMBINDEX, TROPONINI in the last 168 hours. BNP (last 3  results) No results for input(s): PROBNP in the last 8760 hours. HbA1C: No results for input(s): HGBA1C in the last 72 hours. CBG: Recent Labs  Lab 09/04/19 1221 09/04/19 1713 09/04/19 2024 09/04/19 2357 09/05/19 0356  GLUCAP 103* 86 118* 71 94   Lipid Profile: No results for input(s): CHOL, HDL, LDLCALC, TRIG, CHOLHDL, LDLDIRECT in the last 72 hours. Thyroid Function Tests: No results for input(s): TSH, T4TOTAL, FREET4, T3FREE, THYROIDAB in the last 72 hours. Anemia Panel: No results for input(s): VITAMINB12, FOLATE, FERRITIN, TIBC, IRON, RETICCTPCT in the last 72 hours. Sepsis Labs: No results for input(s): PROCALCITON, LATICACIDVEN in the last 168 hours.  No results found for this or any previous visit (from the past 240 hour(s)).       Radiology Studies: No results found.      Scheduled Meds:  acetaminophen  650 mg Oral Q4H   Or   acetaminophen (TYLENOL) oral liquid 160 mg/5 mL  650 mg Per Tube Q4H   Or   acetaminophen  650 mg Rectal Q4H   chlorhexidine  15 mL Mouth Rinse BID   Chlorhexidine Gluconate Cloth  6 each Topical Daily   famotidine  20 mg Oral BID   feeding supplement (ENSURE ENLIVE)  237 mL Oral TID BM   heparin injection (subcutaneous)  5,000 Units Subcutaneous Q8H   levETIRAcetam  500 mg Oral BID   mouth rinse  15 mL Mouth Rinse q12n4p   metoprolol tartrate  50 mg Oral Q8H   sodium chloride flush  10-40 mL Intracatheter Q12H   Continuous Infusions:  sodium chloride 40 mL/hr at 09/05/19 0531   sodium chloride Stopped (08/21/19 2124)   sodium chloride Stopped (08/23/19 0945)   ceFEPime (MAXIPIME) IV 2 g (09/05/19 0955)   vancomycin 1,500 mg (09/05/19 0544)     LOS: 68 days        Georgette Shell, MD [facility]  If 7PM-7AM, please contact night-coverage [email or web address] Password [email or web address] 09/05/2019, 10:53 AM

## 2019-09-05 NOTE — Progress Notes (Signed)
Physical Therapy Treatment Patient Details Name: Matthew Black MRN: [id number] DOB: [birth year] Today's Date: 09/05/2019    History of Present Illness Pt is a 43 y.o. M with no known PMH who presents on 8/25 after being found down at work, nonverbal with L hemiparesis. CT head showing large R hemisphere infarct with edema and mild mass effect. 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 Stable trace midline shift. S/p hemicraniectomy for  edema with abdominal flap implant. BLE DVT vasc US 8/28: acute DVT bilateral lower extremities, s/p IVC placement.  On 9/21 Repeat CT stable large hemorrhagic conversion.    PT Comments    Pt following 1 step commands with R UE and LE 100% of time. Pt continues to present with L sided hemiparesis and neglect requiring maxAx2 for all mobility and standing. Pt remains non-verbal and continues to require maxA to maintain EOB balance due to severe L sided lean. Continue to recommend CIR upon d/c for family education as wife planning on taking him home in the next month. Acute PT to cont to follow.  Follow Up Recommendations  CIR     Equipment Recommendations  Wheelchair (measurements PT);Wheelchair cushion (measurements PT);Hospital bed;Other (comment)    Recommendations for Other Services Rehab consult     Precautions / Restrictions Precautions Precautions: Fall Precaution Comments: L hemiparesis, R skull missing, abd bone flap; mitt for R hand to keep from touching his head Other Brace: helmet (not wearing due to presses on edema on R side of head) Restrictions Weight Bearing Restrictions: No    Mobility  Bed Mobility Overal bed mobility: Needs Assistance Bed Mobility: Rolling;Sidelying to Sit;Sit to Sidelying Rolling: Mod assist;Max assist Sidelying to sit: Max assist;+2 for physical assistance   Sit to supine: Max assist;+2 for physical assistance   General bed mobility comments: pt initiates with R UE  but erquires mod/maxA to complete task. maxA to push up into sitting EOB  Transfers Overall transfer level: Needs assistance Equipment used: (stedy) Transfers: Sit to/from Stand Sit to Stand: Max assist;+2 physical assistance         General transfer comment: used stedy x3 trials, completed sit to stand with gait belt and bed pad below bottom, pt with L knee blocked, able to achieve full upright position but unable to maintain. pt contintues with strong lean to the L requiring max assist to maintain midline  Ambulation/Gait             General Gait Details: unable   Stairs             Wheelchair Mobility    Modified Rankin (Stroke Patients Only) Modified Rankin (Stroke Patients Only) Pre-Morbid Rankin Score: No symptoms Modified Rankin: Severe disability     Balance Overall balance assessment: Needs assistance Sitting-balance support: Feet supported Sitting balance-Leahy Scale: Poor Sitting balance - Comments: strong left lateral lean, unable to maintain head in extension without max assist Postural control: Left lateral lean                                  Cognition Arousal/Alertness: Awake/alert Behavior During Therapy: Flat affect Overall Cognitive Status: Impaired/Different from baseline                         Following Commands: Follows one step commands consistently;Follows one step commands with increased time Safety/Judgement: Decreased awareness of deficits(L  sided neglect) Awareness: Intellectual Problem Solving: Slow processing;Difficulty sequencing;Requires verbal cues;Requires tactile cues General Comments: pt following all commands on the R, unable ont he L, con't to have L sided neglect and inability to self correct, remains non-verbal      Exercises      General Comments        Pertinent Vitals/Pain Pain Assessment: Faces Faces Pain Scale: No hurt    Home Living                      Prior  Function            PT Goals (current goals can now be found in the care plan section) Progress towards PT goals: Progressing toward goals    Frequency    Min 3X/week      PT Plan Current plan remains appropriate    Co-evaluation              AM-PAC PT "6 Clicks" Mobility   Outcome Measure  Help needed turning from your back to your side while in a flat bed without using bedrails?: A Lot Help needed moving from lying on your back to sitting on the side of a flat bed without using bedrails?: Total Help needed moving to and from a bed to a chair (including a wheelchair)?: Total Help needed standing up from a chair using your arms (e.g., wheelchair or bedside chair)?: Total Help needed to walk in hospital room?: Total Help needed climbing 3-5 steps with a railing? : Total 6 Click Score: 7    End of Session Equipment Utilized During Treatment: Gait belt Activity Tolerance: Patient tolerated treatment well Patient left: in bed;with call bell/phone within reach;with nursing/sitter in room Nurse Communication: Mobility status;Need for lift equipment(use maxi lift to transfer pt into chair) PT Visit Diagnosis: Hemiplegia and hemiparesis;Other symptoms and signs involving the nervous system (R29.898);Other abnormalities of gait and mobility (R26.89) Hemiplegia - Right/Left: Left Hemiplegia - dominant/non-dominant: Non-dominant Hemiplegia - caused by: Cerebral infarction     Time: [id number] PT Time Calculation (min) (ACUTE ONLY): 28 min  Charges:  $Therapeutic Activity: 8-22 mins $Neuromuscular Re-education: 8-22 mins                     Kittie Plater, PT, DPT Acute Rehabilitation Services Pager #: [phone] Office #: [phone]    Berline Lopes 09/05/2019, 5:26 PM

## 2019-09-05 NOTE — Progress Notes (Signed)
Inpatient Rehabilitation Admissions Coordinator  Dr Naaman Plummer assessed patient for tolerance to admit to inpt rehab. I met with pt's wife to discuss ELOS 3 to 4 weeks with wheelchair goals of heavy mod assist  With goals of d/c home with 24 /7 care of family. Wife prefers an inpt rehab admission, not SNF. I will clarify bed availability for plans to admit to CIR this week. I have encouraged pt's wife to complete the disability and medicaid applications for he has Medicaid for family only.  Danne Baxter, RN, MSN Rehab Admissions Coordinator [phone] 09/05/2019 3:33 PM

## 2019-09-06 ENCOUNTER — Inpatient Hospital Stay (HOSPITAL_COMMUNITY)
Admission: RE | Admit: 2019-09-06 | Discharge: 2019-10-07 | DRG: 056 | Disposition: A | Discharge status: Home / Self Care | Payer: Medicaid Other | Source: Intra-hospital | Attending: Physical Medicine & Rehabilitation | Admitting: Physical Medicine & Rehabilitation

## 2019-09-06 ENCOUNTER — Encounter (HOSPITAL_COMMUNITY): Payer: Self-pay | Admitting: *Deleted

## 2019-09-06 ENCOUNTER — Other Ambulatory Visit: Payer: Self-pay

## 2019-09-06 DIAGNOSIS — G935 Compression of brain: Secondary | ICD-10-CM | POA: Diagnosis present

## 2019-09-06 DIAGNOSIS — Z95828 Presence of other vascular implants and grafts: Secondary | ICD-10-CM | POA: Diagnosis not present

## 2019-09-06 DIAGNOSIS — R7401 Elevation of levels of liver transaminase levels: Secondary | ICD-10-CM | POA: Diagnosis not present

## 2019-09-06 DIAGNOSIS — I6521 Occlusion and stenosis of right carotid artery: Secondary | ICD-10-CM | POA: Diagnosis present

## 2019-09-06 DIAGNOSIS — I10 Essential (primary) hypertension: Secondary | ICD-10-CM | POA: Diagnosis not present

## 2019-09-06 DIAGNOSIS — I63511 Cerebral infarction due to unspecified occlusion or stenosis of right middle cerebral artery: Secondary | ICD-10-CM | POA: Diagnosis not present

## 2019-09-06 DIAGNOSIS — G060 Intracranial abscess and granuloma: Secondary | ICD-10-CM | POA: Diagnosis not present

## 2019-09-06 DIAGNOSIS — B965 Pseudomonas (aeruginosa) (mallei) (pseudomallei) as the cause of diseases classified elsewhere: Secondary | ICD-10-CM | POA: Diagnosis present

## 2019-09-06 DIAGNOSIS — F3289 Other specified depressive episodes: Secondary | ICD-10-CM | POA: Diagnosis present

## 2019-09-06 DIAGNOSIS — Y838 Other surgical procedures as the cause of abnormal reaction of the patient, or of later complication, without mention of misadventure at the time of the procedure: Secondary | ICD-10-CM | POA: Diagnosis present

## 2019-09-06 DIAGNOSIS — R1310 Dysphagia, unspecified: Secondary | ICD-10-CM | POA: Diagnosis present

## 2019-09-06 DIAGNOSIS — F329 Major depressive disorder, single episode, unspecified: Secondary | ICD-10-CM

## 2019-09-06 DIAGNOSIS — R509 Fever, unspecified: Secondary | ICD-10-CM | POA: Diagnosis not present

## 2019-09-06 DIAGNOSIS — Z86718 Personal history of other venous thrombosis and embolism: Secondary | ICD-10-CM | POA: Diagnosis not present

## 2019-09-06 DIAGNOSIS — Z79899 Other long term (current) drug therapy: Secondary | ICD-10-CM | POA: Diagnosis not present

## 2019-09-06 DIAGNOSIS — T8141XD Infection following a procedure, superficial incisional surgical site, subsequent encounter: Secondary | ICD-10-CM

## 2019-09-06 DIAGNOSIS — R55 Syncope and collapse: Secondary | ICD-10-CM | POA: Diagnosis not present

## 2019-09-06 DIAGNOSIS — G0490 Encephalitis and encephalomyelitis, unspecified: Secondary | ICD-10-CM

## 2019-09-06 DIAGNOSIS — G479 Sleep disorder, unspecified: Secondary | ICD-10-CM | POA: Diagnosis present

## 2019-09-06 DIAGNOSIS — I69391 Dysphagia following cerebral infarction: Secondary | ICD-10-CM

## 2019-09-06 DIAGNOSIS — E876 Hypokalemia: Secondary | ICD-10-CM | POA: Diagnosis not present

## 2019-09-06 DIAGNOSIS — I69354 Hemiplegia and hemiparesis following cerebral infarction affecting left non-dominant side: Principal | ICD-10-CM

## 2019-09-06 DIAGNOSIS — T8149XA Infection following a procedure, other surgical site, initial encounter: Secondary | ICD-10-CM | POA: Diagnosis not present

## 2019-09-06 DIAGNOSIS — Z87891 Personal history of nicotine dependence: Secondary | ICD-10-CM | POA: Diagnosis not present

## 2019-09-06 DIAGNOSIS — I824Z3 Acute embolism and thrombosis of unspecified deep veins of distal lower extremity, bilateral: Secondary | ICD-10-CM | POA: Diagnosis not present

## 2019-09-06 LAB — GLUCOSE, CAPILLARY
Glucose-Capillary: 115 mg/dL — ABNORMAL HIGH (ref 70–99)
Glucose-Capillary: 87 mg/dL (ref 70–99)
Glucose-Capillary: 87 mg/dL (ref 70–99)

## 2019-09-06 LAB — VANCOMYCIN, TROUGH: Vancomycin Tr: 19 ug/mL (ref 15–20)

## 2019-09-06 MED ORDER — SENNOSIDES-DOCUSATE SODIUM 8.6-50 MG PO TABS: 1.0000 | ORAL_TABLET | Freq: Every evening | ORAL | Status: DC | PRN

## 2019-09-06 MED ORDER — VANCOMYCIN HCL 10 G IV SOLR
1250.0000 mg | Freq: Three times a day (TID) | INTRAVENOUS | Status: DC
  Administered 2019-09-06 – 2019-09-12 (×17): 1250 mg via INTRAVENOUS
  Filled 2019-09-06 (×21): qty 1250

## 2019-09-06 MED ORDER — FAMOTIDINE 20 MG PO TABS
20.0000 mg | ORAL_TABLET | Freq: Two times a day (BID) | ORAL | Status: DC
  Administered 2019-09-06 – 2019-10-07 (×57): 20 mg via ORAL
  Filled 2019-09-06 (×61): qty 1

## 2019-09-06 MED ORDER — ONDANSETRON HCL 4 MG/2ML IJ SOLN: 4.0000 mg | Freq: Four times a day (QID) | INTRAMUSCULAR | 0 refills | Status: DC | PRN

## 2019-09-06 MED ORDER — ACETAMINOPHEN 160 MG/5ML PO SOLN
650.0000 mg | ORAL | Status: DC
  Administered 2019-09-12: 650 mg
  Filled 2019-09-06 (×4): qty 20.3

## 2019-09-06 MED ORDER — FAMOTIDINE 20 MG PO TABS: 20.0000 mg | ORAL_TABLET | Freq: Two times a day (BID) | ORAL | Status: DC

## 2019-09-06 MED ORDER — RESOURCE THICKENUP CLEAR PO POWD
ORAL | Status: DC | PRN
  Filled 2019-09-06: qty 125

## 2019-09-06 MED ORDER — TRAMADOL HCL 50 MG PO TABS
50.0000 mg | ORAL_TABLET | Freq: Four times a day (QID) | ORAL | Status: DC | PRN
  Administered 2019-09-13 – 2019-10-07 (×23): 50 mg via ORAL
  Filled 2019-09-06 (×29): qty 1

## 2019-09-06 MED ORDER — ACETAMINOPHEN 325 MG PO TABS
650.0000 mg | ORAL_TABLET | ORAL | Status: DC
  Administered 2019-09-06 – 2019-10-06 (×108): 650 mg via ORAL
  Filled 2019-09-06 (×129): qty 2

## 2019-09-06 MED ORDER — HEPARIN SODIUM (PORCINE) 5000 UNIT/ML IJ SOLN: 5000.0000 [IU] | Freq: Three times a day (TID) | INTRAMUSCULAR | Status: DC

## 2019-09-06 MED ORDER — ACETAMINOPHEN 650 MG RE SUPP
650.0000 mg | RECTAL | Status: DC
  Filled 2019-09-06 (×6): qty 1

## 2019-09-06 MED ORDER — SODIUM CHLORIDE 0.9% FLUSH
10.0000 mL | INTRAVENOUS | Status: DC | PRN
  Administered 2019-09-15 – 2019-09-16 (×2): 10 mL
  Administered 2019-09-17: 20:00:00 20 mL
  Administered 2019-09-18: 05:00:00 30 mL
  Filled 2019-09-06 (×4): qty 40

## 2019-09-06 MED ORDER — CHLORHEXIDINE GLUCONATE CLOTH 2 % EX PADS
6.0000 | MEDICATED_PAD | Freq: Every day | CUTANEOUS | Status: DC
  Administered 2019-09-06 – 2019-09-14 (×8): 6 via TOPICAL

## 2019-09-06 MED ORDER — LEVETIRACETAM 500 MG PO TABS
500.0000 mg | ORAL_TABLET | Freq: Two times a day (BID) | ORAL | Status: DC
  Administered 2019-09-06 – 2019-09-07 (×2): 500 mg via ORAL
  Filled 2019-09-06 (×2): qty 1

## 2019-09-06 MED ORDER — VANCOMYCIN HCL 10 G IV SOLR
1250.0000 mg | Freq: Three times a day (TID) | INTRAVENOUS | Status: DC
  Administered 2019-09-06: 1250 mg via INTRAVENOUS
  Filled 2019-09-06 (×3): qty 1250

## 2019-09-06 MED ORDER — HEPARIN SODIUM (PORCINE) 5000 UNIT/ML IJ SOLN
5000.0000 [IU] | Freq: Three times a day (TID) | INTRAMUSCULAR | Status: DC
  Administered 2019-09-06 – 2019-10-07 (×82): 5000 [IU] via SUBCUTANEOUS
  Filled 2019-09-06 (×93): qty 1

## 2019-09-06 MED ORDER — SODIUM CHLORIDE 0.9 % IV SOLN
INTRAVENOUS | Status: DC | PRN
  Administered 2019-09-06 – 2019-10-04 (×7): via INTRAVENOUS

## 2019-09-06 MED ORDER — SORBITOL 70 % SOLN
30.0000 mL | Freq: Every day | Status: DC | PRN
  Administered 2019-09-22: 30 mL via ORAL
  Filled 2019-09-06: qty 30

## 2019-09-06 MED ORDER — SODIUM CHLORIDE 0.9 % IV SOLN
2.0000 g | Freq: Three times a day (TID) | INTRAVENOUS | Status: DC
  Administered 2019-09-06 – 2019-10-06 (×92): 2 g via INTRAVENOUS
  Filled 2019-09-06 (×97): qty 2

## 2019-09-06 MED ORDER — ENSURE ENLIVE PO LIQD
237.0000 mL | Freq: Three times a day (TID) | ORAL | Status: DC
  Administered 2019-09-06 – 2019-10-07 (×79): 237 mL via ORAL
  Filled 2019-09-06 (×3): qty 237

## 2019-09-06 MED ORDER — METOPROLOL TARTRATE 50 MG PO TABS: 50.0000 mg | ORAL_TABLET | Freq: Three times a day (TID) | ORAL | 1 refills | Status: DC

## 2019-09-06 MED ORDER — LEVETIRACETAM 500 MG PO TABS: 500.0000 mg | ORAL_TABLET | Freq: Two times a day (BID) | ORAL | 1 refills | Status: DC

## 2019-09-06 MED ORDER — METOPROLOL TARTRATE 50 MG PO TABS
50.0000 mg | ORAL_TABLET | Freq: Three times a day (TID) | ORAL | Status: DC
  Administered 2019-09-06 – 2019-10-07 (×92): 50 mg via ORAL
  Filled 2019-09-06 (×95): qty 1

## 2019-09-06 NOTE — Progress Notes (Signed)
Pharmacy Antibiotic Note  Matthew Black is a 43 y.o. male transferred from acute to CIR on  09/06/2019.  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  Pharmacy has been consulted to continue dosing/monitoring cefepime and vancomycin for brain abscess/cerebritis.   Pt was started on vancomycin and cefepime on 08/20/19, with planned duration of 6-8 weeks per ID. On transfer to CIR, he was receiving cefepime 2 gm IV Q 8 hrs and vancomycin 1250 mg IV Q 8 hrs, with goal vancomycin trough level of 15-20 mg/L. Vancomycin trough today was up to 19 mg/L and had been trending up on vancomycin 1500 mg IV Q 8 hr regimen, so regimen was adjusted to 1250 mg IV Q 8 hrs today.  WBC 5.5, afebrile; Scr 0.58, CrCl 129.4 ml/min (renal function stable)  Plan: Continue cefepime 2 gm IV Q 8 hrs  Continue vancomycin 1250 mg IV Q 8 hrs (goal vanc trough of 15-20 mg/L) Monitor renal function, WBC, temp, vancomycin levels, cultures, clinical improvement  Height: 5\' 11"  (180.3 cm) Weight: 194 lb 0.1 oz (88 kg) IBW/kg (Calculated) : 75.3  Temp (24hrs), Avg:99 F (37.2 C), Min:98.4 F (36.9 C), Max:99.5 F (37.5 C)  Recent Labs  Lab 08/31/19 0500  09/02/19 0529 09/02/19 1330 09/04/19 0555 09/05/19 0500 09/06/19 1329  WBC 6.1  --  6.2  --   --  5.5  --   CREATININE 0.56*  --  0.61  --  0.60* 0.58*  --   VANCOTROUGH  --    < >  --  15  --   --  19   < > = values in this interval not displayed.    Estimated Creatinine Clearance: 129.4 mL/min (A) (by C-G formula based on SCr of 0.58 mg/dL (L)).     Allergies  Allergen Reactions  . Hydrocodone Nausea Only    Antimicrobials this admission: 9/21 Zosyn >> 9/26 9/26 Cefepime >> (11/20) 9/26 Vancomycin >> (11/20)  Microbiology results: 9/27 BCx X 2: NG/final 9/17  UCx: Pseudomonas aeruginosa (suscept to Zosyn) 9/14  Abdominal fluid: Gram stain: rate WBCs, NOS; cx: NG/final 9/27 Epidural abscess: Gram stain: mod WBCs (PMNs), NOS; cx: NG/final 9/27 MRSA PCR: positive  Thank you for allowing pharmacy to be a part of this patient's care.  Gillermina Hu, PharmD, BCPS, [facility] Clinical Pharmacist 09/06/2019 4:33 PM

## 2019-09-06 NOTE — Progress Notes (Signed)
Meredith Staggers, MD  Physician  Physical Medicine and Rehabilitation  PMR Pre-admission  Addendum  Date of Service:  09/06/2019 10:15 AM      Related encounter: ED to Hosp-Admission (Discharged) from 07/19/2019 in [facility]         Show:Clear all [x] Manual[x] Template[x] Copied  Added by: [x] Cristina Gong, RN  [] Hover for details PMR Admission Coordinator Pre-Admission Assessment  Patient: Matthew Black is an 43 y.o., male MRN: [id number] DOB: Oct 17, 1976 Height: 5\' 11"  (180.3 cm) Weight: 92.4 kg                                                                                                                                                  Insurance Information HMO:     PPO:      PCP:      IPA:      80/20:     OTHER:  PRIMARY: Medicaid of [place]      Policy#: [id number]      Subscriber: pt Benefits:  Phone #: passport one online     Name: 09/05/2019 Eff. Date: active MAFCN; new this admission     Deduct:       Out of Pocket Max:       Life Max:  CIR: per Medicaid guidelines       Wife will talk about applying for other insurance. She is talking about marketplace insurance with open enrollment 09/25/2019. They had applied last April but did not get for undisclosed reasons. I have explained to her that if he was eligible, it would not be effective until 11/2019.  Financial counselor, Shanon Rosser , has been working with wife and pt initially was uninsured. He now has Medicaid for families. 08/05/2019 patient was referred to [facility] for disability. I spoke with wife 10/12 and she states she is reviewing the disability paperwork. I have encouraged her to complete the disability application to assist with obtaining him monthly funding as well as full Medicaid benefits if eligible. I did contact Shanon Rosser, financial counselor to request she follow up with wife on the disability.  Medicaid Application Date:       Case Manager:  Disability  Application Date:       Case Worker:  Working with Development worker, community on full medicaid and disability application since [id number] per FC.   The "Data Collection Information Summary" for patients in Inpatient Rehabilitation Facilities with attached "[place] Records" was provided and verbally reviewed with: N/A  Emergency Contact Information         Contact Information    Name Relation Home Work Mobile   [place] Spouse [phone]  [phone]   [name] Mother [phone]  [phone]     Current Medical History  Patient Admitting Diagnosis: right MCA/ACA infarction  History of Present Illness: 43 year old right-handed male  with unremarkable past medical history on no prescription medications. He quit smoking 3 years ago.  Presented 07/19/2019 after being found down. He had dried vomit around his mouth and was covered in urine. Nonverbal not moving his left side. CT of the head showed a large right hemisphere infarction with confluence cytotoxic edema in the right ACA and MCA territories. Evidence of large vessel occlusion. CT cervical spine negative. MRI/MRA showed right ICA occlusion. 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 Alcohol negative, urine drug screen positive marijuana, creatinine 1.43, urine culture no growth, COVID negative. Patient underwent decompressive right hemicraniectomy with abdominal flap implant 07/19/2019 per Dr. Kathyrn Sheriff. Echocardiogram with ejection fraction of 65%. Bubble study negative for PFO. EEG with severe diffuse encephalopathy no seizure noted. Hospital course further complicated by lower extremity Doppler showing a DVT right peroneal, left popliteal, left posterior tibial and left peroneal. He underwent IVC filter placement 07/21/2019 with plan for retrieval in 8 to 12 weeks. He was not a candidate for long-term anticoagulation due to craniotomy but patient is  currently maintained on subcutaneous heparin 5000 units every 8 hours. Patient n.p.o. with Cortrakfeeding tube for nutritional support and diet has been advanced to dysphagia #2 nectar liquid and nasogastric tube feeds discontinued. Keppra for seizure prophylaxis. He remained intubated 07/20/2019 through 07/25/2019. 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 He underwent aspiration on 08/21/2019 per Dr. Kathyrn Sheriff and cultures were sent with no growth to date. He was not a candidate for surgery due to increasing herniation. ID consulted placed on IV vancomycin and cefepime 08/20/2019 x6 to 8 weeks. Patient also developed Pseudomonas UTI completing a course of Zosyn. Palliative care consulted 08/22/2019 to establish goals of care. Therapy evaluations completed and patient was admitted for a comprehensive rehab program.  Complete NIHSS TOTAL: 22 Glasgow Coma Scale Score: 10  Past Medical History      Past Medical History:  Diagnosis Date  . 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  Prior Rehab/Hospitalizations:  Has the patient had prior rehab or hospitalizations prior to admission? Yes  Has the patient had major surgery during 100 days prior to admission? Yes  Current Medications   Current Facility-Administered Medications:  .  0.9 %  sodium chloride infusion, , Intravenous, Continuous, Biby, Sharon L, NP, Last Rate: 40 mL/hr at 09/05/19 0531 .  0.9 %  sodium chloride infusion, , Intravenous, PRN, Donzetta Starch, NP, Stopped at 08/21/19 2124 .  0.9 %  sodium chloride infusion, , Intravenous, PRN, Donzetta Starch, NP, Stopped at 08/23/19 0945 .  acetaminophen (TYLENOL) tablet 650 mg, 650 mg, Oral, Q4H, 650 mg at 09/06/19 0445 **OR** acetaminophen (TYLENOL) solution 650 mg, 650 mg, Per Tube, Q4H, 650 mg at 09/06/19 0753 **OR** acetaminophen (TYLENOL) suppository 650 mg, 650 mg, Rectal, Q4H, Biby, Sharon L, NP .  ceFEPIme (MAXIPIME) 2 g in sodium chloride 0.9 % 100 mL IVPB, 2 g, Intravenous, Q8H, Sinclair, Emily S, RPH, Last Rate: 200 mL/hr at 09/06/19 1000, 2 g at 09/06/19 1000 .  chlorhexidine (PERIDEX) 0.12 % solution 15 mL, 15 mL, Mouth Rinse, BID, Biby, Sharon L, NP, 15 mL at 09/06/19 1008 .  Chlorhexidine Gluconate Cloth 2 % PADS 6 each, 6 each, Topical, Daily, Donzetta Starch, NP, 6 each at 09/06/19 0753 .  famotidine (PEPCID) tablet 20 mg, 20 mg, Oral, BID, Georgette Shell, MD, 20 mg at 09/06/19 1007 .  feeding supplement (ENSURE ENLIVE) (ENSURE ENLIVE) liquid 237 mL, 237 mL, Oral, TID BM, Georgette Shell, MD, 237 mL at 09/05/19 1950 .  heparin injection 5,000 Units, 5,000 Units, Subcutaneous, Q8H, Donzetta Starch, NP, 5,000 Units at 09/06/19 0446 .  labetalol (NORMODYNE) injection 5-20 mg, 5-20 mg, Intravenous, Q10 min PRN, Biby, Sharon L, NP .  levETIRAcetam (KEPPRA) tablet 500 mg, 500 mg, Oral, BID, Georgette Shell, MD, 500 mg at 09/06/19 1007 .  MEDLINE mouth rinse, 15 mL, Mouth Rinse, q12n4p, Biby, Sharon L, NP, 15 mL at 09/05/19 1300 .  metoprolol tartrate (LOPRESSOR) tablet 50 mg, 50 mg, Oral, Q8H, Georgette Shell, MD, 50 mg at 09/06/19 0445 .  ondansetron (ZOFRAN) injection 4 mg, 4 mg, Intravenous, Q6H PRN, Donzetta Starch, NP .  Resource ThickenUp Clear, , Oral, PRN, Eugenie Filler, MD .  senna-docusate (Senokot-S) tablet 1 tablet, 1 tablet, Per Tube, QHS PRN, Donzetta Starch, NP, 1 tablet at 08/17/19 1417 .  sodium chloride flush (NS) 0.9 % injection 10-40 mL, 10-40 mL, Intracatheter, Q12H, Biby, Sharon L, NP, 10 mL at 09/06/19 1008 .  sodium chloride flush (NS)  0.9 % injection 10-40 mL, 10-40 mL, Intracatheter, PRN, Biby, Sharon L, NP, 10 mL at 09/03/19 1326 .  traMADol (ULTRAM) tablet 50 mg, 50 mg, Per Tube, Q6H PRN, Donzetta Starch, NP, 50 mg at 09/04/19 2030 .  vancomycin (VANCOCIN) 1,500 mg in sodium chloride 0.9 % 500 mL IVPB, 1,500 mg, Intravenous, Q8H, Rumbarger, Valeda Malm, RPH, Last Rate: 250 mL/hr at 09/06/19 0447, 1,500 mg at 09/06/19 0447  Patients Current Diet:     Diet Order                  DIET DYS 2 Room service appropriate? No; Fluid consistency: Nectar Thick  Diet effective now               Precautions / Restrictions Precautions Precautions: Fall Precaution Comments: L hemiparesis, R skull missing, abd bone flap; mitt for R hand to keep from touching his head Other Brace: helmet (not wearing due to presses on edema on R side of head) Restrictions Weight Bearing Restrictions: No   Has the patient had 2 or more falls or a fall with injury in the past year?No  Prior Activity Level Community (5-7x/wk): Independent, driving, self employed carpet cleaning buisiness  Prior Functional Level Prior Function Level of Independence: Independent Comments: working  Self Care: Did the patient need help bathing, dressing, using the toilet or eating?  Independent  Indoor Mobility: Did the patient need assistance with walking from room to room (with or without device)? Independent  Stairs: Did the patient need assistance with internal or external stairs (with or without device)? Independent  Functional Cognition: Did the patient need help planning regular tasks such as shopping or remembering to take medications? Independent  Home Assistive Devices / Equipment Home Assistive Devices/Equipment: None  Prior Device Use: Indicate devices/aids used by the patient prior to current illness, exacerbation or injury? None of the above  Current Functional Level Cognition  Arousal/Alertness: Lethargic Overall  Cognitive Status: Impaired/Different from baseline Difficult to assess due to: Impaired communication Current Attention Level: Sustained Orientation Level: Other (comment)(UTA pt no speaking  to RN ) Following Commands: Follows one step commands consistently, Follows one step commands with increased time Safety/Judgement: Decreased awareness of deficits(L sided neglect) General Comments: pt following all commands on the R, unable ont he L, con't to have L sided neglect and inability to self correct, remains non-verbal Attention: Focused Focused Attention: Impaired Focused Attention Impairment: Functional basic, Verbal basic Memory: (Unable to assess due to lethargy. TBA) Awareness: Impaired Awareness Impairment: Emergent impairment Problem Solving: (to be assessed further ) Safety/Judgment: Impaired [employer] Scales of Cognitive Functioning: (not a brain injury)    Extremity Assessment (includes Sensation/Coordination)  Upper Extremity Assessment: RUE deficits/detail, LUE deficits/detail RUE Deficits / Details: Pt reaching forward towards his knee; weakness with gross motor. Able to make a "thumbs up"  RUE Coordination: decreased fine motor, decreased gross motor LUE Deficits / Details: No active movement of LUE LUE Coordination: decreased fine motor, decreased gross motor  Lower Extremity Assessment: Defer to PT evaluation RLE Deficits / Details: Moving spontaneously LLE Deficits / Details: Autonomic movement noted with coughing, otherwise 0/5    ADLs  Overall ADL's : Needs assistance/impaired Grooming: Wash/dry face, Sitting, Moderate assistance Grooming Details (indicate cue type and reason): tactile cues and hand over hand assist to initiate task with RUE Toileting- Clothing Manipulation and Hygiene: Total assistance, +2 for physical assistance, Bed level Toileting - Clothing Manipulation Details (indicate cue type and reason): total A for toilet hygiene at bed level  atfer bowel incontience.  Functional mobility during ADLs: Total assistance, +2 for physical assistance, +2 for safety/equipment(Maxi move to chair) General ADL Comments: session focus on functional mobility and seated ADLS in TIS to attempt to increase level of arousal    Mobility  Overal bed mobility: Needs Assistance Bed Mobility: Rolling, Sidelying to Sit, Sit to Sidelying Rolling: Mod assist, Max assist Sidelying to sit: Max assist, +2 for physical assistance Supine to sit: Total assist, HOB elevated, +2 for physical assistance Sit to supine: Max assist, +2 for physical assistance General bed mobility comments: pt initiates with R UE but erquires mod/maxA to complete task. maxA to push up into sitting EOB    Transfers  Overall transfer level: Needs assistance Equipment used: (stedy) Transfer via Lift Equipment: Maximove Transfers: Sit to/from Stand Sit to Stand: Max assist, +2 physical assistance General transfer comment: used stedy x3 trials, completed sit to stand with gait belt and bed pad below bottom, pt with L knee blocked, able to achieve full upright position but unable to maintain. pt contintues with strong lean to the L requiring max assist to maintain midline    Ambulation / Gait / Stairs / Wheelchair Mobility  Ambulation/Gait General Gait Details: unable    Posture / Balance Dynamic Sitting Balance Sitting balance - Comments: strong left lateral lean, unable to maintain head in extension without max assist Balance Overall balance assessment: Needs assistance Sitting-balance support: Feet supported Sitting balance-Leahy Scale: Poor Sitting balance - Comments: strong left lateral lean, unable to maintain head in extension without max assist Postural control: Left lateral lean Standing balance support: Single extremity supported    Special needs/care consideration BiPAP/CPAP n/a CPM n/a Continuous Drip IV PICC RUE placed 07/20/2019; Vanc and cefepime IV  started 9/26 for 6 to 8 weeks LOT Dialysis n/a Life Vest n/a Oxygen n/a Special Bed seizure precautions Trach Size n/a Wound Vac n/a Skin Right head surgical incision with attached edges; surgical wound with bone flap to abdomen, abrasion to left of face; blister to right of neck; ecchymosis  to right knee, skin tear to left buttocks Bowel mgmt: incontinent LBM 10/12 Bladder mgmt:external catheter Diabetic mgmt n/a Behavioral consideration  N/a Chemo/radiation  N/a Designated visitor is wife, Melene Muller    Previous Home Environment  Living Arrangements: Spouse/significant other, Children(also 61 and 15 year old children in the home)  Lives With: Spouse, Family(two children) Available Help at Discharge: Family, Available 24 hours/day(pt's Mom and stepdad as well as wife's parents to assist) Type of Home: House Home Layout: Two level, 1/2 bath on main level, Bed/bath upstairs(no bed or full bath downstairs) Alternate Level Stairs-Number of Steps: flight Home Access: Level entry Bathroom Shower/Tub: Walk-in shower(upstairs) Biochemist, clinical: Standard Bathroom Accessibility: Yes How Accessible: Accessible via walker [place]: No Additional Comments: wife next of kin  Discharge Living Setting Plans for Discharge Living Setting: Patient's home, Lives with (comment)(wife, two children 39 and 32 years old) Type of Home at Discharge: House Discharge Home Layout: Two level, 1/2 bath on main level, Bed/bath upstairs(will set up bedroom downstairs in dining room) Discharge Home Access: Level entry Discharge Bathroom Shower/Tub: Walk-in shower(upstairs; 1/2 bath on main level) Discharge Bathroom Toilet: Standard Discharge Bathroom Accessibility: Yes How Accessible: Accessible via walker Does the patient have any problems obtaining your medications?: Yes (Describe)(uninsured pta; now had medicaid for families since working w)  Jeff Patient Roles: Spouse,  Parent(self employed) Sport and exercise psychologist Information: wife, Alease Medina Anticipated Caregiver: wife, his Mom and Ronda Fairly as well as wife's parents Anticipated Ambulance person Information: [phone] Ability/Limitations of Caregiver: wife unemployed; other paretns and in laws work but they will rearrange schedules to assist with wife in his care per wife Caregiver Availability: 24/7 Discharge Plan Discussed with Primary Caregiver: Yes Is Caregiver In Agreement with Plan?: Yes Does Caregiver/Family have Issues with Lodging/Transportation while Pt is in Rehab?: No   Goals/Additional Needs Patient/Family Goal for Rehab: Mod assist PT, OT and SLP at wheelchair level goals Expected length of stay: ELOS 3 to 4 weeks if making progress; otherwise if he plateaus, wife aware that he will d/c home sooner Pt/Family Agrees to Admission and willing to participate: Yes Program Orientation Provided & Reviewed with Pt/Caregiver Including Roles  & Responsibilities: Yes  Wife had one SNF bed offer at [facility] in [place]. Wife refused bed offer due to rating and wish for CIR acceptance.  Decrease burden of Care through IP rehab admission: n/a  Possible need for SNF placement upon discharge: I discussed with wife on 10/9 and 10/12 that CIR admission with goal of d/c home at heavy mod assist wheelchair level. Plan is not for SNF after CIR . She would be giving up her one SNF bed offer locally and that other SNF searches would be extended further.   Patient Condition: This patient's medical and functional status has changed since the consult dated 07/27/2019 in which the Rehabilitation Physician determined and documented that the patient was potentially appropriate for intensive rehabilitative care in an inpatient rehabilitation facility. Issues have been addressed and update has been discussed with Dr. Naaman Plummer and patient now appropriate for inpatient rehabilitation. Will admit to inpatient rehab today.    Preadmission Screen Completed By:  Cleatrice Burke, RN, 09/06/2019 10:16 AM ______________________________________________________________________   Discussed status with Dr. Naaman Plummer on 09/06/2019 at 1124 and received approval for admission today.  Admission Coordinator:  Cleatrice Burke, time [id number] Date 09/06/2019     Revision History

## 2019-09-06 NOTE — Plan of Care (Signed)
  Problem: Consults Goal: RH STROKE PATIENT EDUCATION Description: See Patient Education module for education specifics  Outcome: Progressing Goal: Nutrition Consult-if indicated Outcome: Progressing   Problem: RH BOWEL ELIMINATION Goal: RH STG MANAGE BOWEL WITH ASSISTANCE Description: STG Manage Bowel with Assistance. Outcome: Progressing Goal: RH STG MANAGE BOWEL W/MEDICATION W/ASSISTANCE Description: STG Manage Bowel with Medication with Assistance. Outcome: Progressing   Problem: RH BLADDER ELIMINATION Goal: RH STG MANAGE BLADDER WITH ASSISTANCE Description: STG Manage Bladder With Assistance Outcome: Progressing   Problem: RH SKIN INTEGRITY Goal: RH STG SKIN FREE OF INFECTION/BREAKDOWN Outcome: Progressing Goal: RH STG MAINTAIN SKIN INTEGRITY WITH ASSISTANCE Description: STG Maintain Skin Integrity With Assistance. Outcome: Progressing Goal: RH STG ABLE TO PERFORM INCISION/WOUND CARE W/ASSISTANCE Description: STG Able To Perform Incision/Wound Care With Assistance. Outcome: Progressing   Problem: RH SAFETY Goal: RH STG ADHERE TO SAFETY PRECAUTIONS W/ASSISTANCE/DEVICE Description: STG Adhere to Safety Precautions With Assistance/Device. Outcome: Progressing   Problem: RH COGNITION-NURSING Goal: RH STG USES MEMORY AIDS/STRATEGIES W/ASSIST TO PROBLEM SOLVE Description: STG Uses Memory Aids/Strategies With Assistance to Problem Solve. Outcome: Progressing   Problem: RH PAIN MANAGEMENT Goal: RH STG PAIN MANAGED AT OR BELOW PT'S PAIN GOAL Outcome: Progressing   Problem: RH KNOWLEDGE DEFICIT Goal: RH STG INCREASE KNOWLEDGE OF HYPERTENSION Outcome: Progressing Goal: RH STG INCREASE KNOWLEDGE OF DYSPHAGIA/FLUID INTAKE Outcome: Progressing Goal: RH STG INCREASE KNOWLEGDE OF HYPERLIPIDEMIA Outcome: Progressing Goal: RH STG INCREASE KNOWLEDGE OF STROKE PROPHYLAXIS Outcome: Progressing

## 2019-09-06 NOTE — Progress Notes (Signed)
Orthopedic Tech Progress Note Patient Details:  Matthew Black August 23, 1976 [id number]  Patient ID: [name], male   DOB: [birth year], 43 y.o.   MRN: [id number]   [place] Pink 09/06/2019, 4:22 PMCalled [facility] for helmet after craniectomy.

## 2019-09-06 NOTE — Evaluation (Signed)
Physical Therapy Assessment and Plan  Patient Details  Name: Matthew Black MRN: [id number] Date of Birth: [birth year]  PT Diagnosis: Abnormal posture, Coordination disorder, Difficulty walking, Hemiplegia non-dominant and Muscle weakness Rehab Potential: Fair ELOS: 24-28 days   Today's Date: 09/07/2019 PT Individual Time: 1300-1415 PT Individual Time Calculation (min): 75 min    Problem List:  Patient Active Problem List   Diagnosis Date Noted  . 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 Cerebral abscess   . Urinary tract infection without hematuria   . Altered mental status   . Primary hypercoagulable state ([place])   . Acute pulmonary embolism without acute cor pulmonale (HCC)   . Deep vein thrombosis (DVT) of non-extremity vein   . Hypokalemia   . FUO (fever of unknown origin)   . Acute blood loss anemia   . SIRS (systemic inflammatory response syndrome) (HCC)   . Leukocytosis   . Endotracheal tube present   . Acute respiratory failure with hypoxemia ([place])   . Stroke (cerebrum) ([place]) 07/19/2019  . Pressure injury of skin 07/19/2019  . Acute CVA (cerebrovascular accident) ([place])   . Encephalopathy   . Dysphagia   . Acute encephalopathy   . Essential hypertension   . Annual physical exam 09/11/2016  . Obesity 03/12/2016  . PCP NOTES >>>>>>>>>>>>>>>>>. 03/12/2016  . Scalp abscess 12/20/2015  . Neck abscess 12/20/2015  . Pilonidal cyst 02/08/2013    Past Medical History:  Past Medical History:  Diagnosis Date  . 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 CRANIOTOMY Right 07/19/2019   Procedure: RIGHT HEMI-CRANIECTOMY With implantation of skull flap to abdominal wall;  Surgeon: Consuella Lose, MD;  Location: [facility];  Service: Neurosurgery;  Laterality: Right;  . CYST EXCISION N/A 10/08/2016   Procedure: EXCISION OF POSTERIOR NECK CYST;  Surgeon: Clovis Riley, MD;  Location: [facility];   Service: General;  Laterality: N/A;  . INCISION AND DRAINAGE ABSCESS N/A 09/22/2014   Procedure: INCISION AND DRAINAGE ABSCESS POSTERIOR NECK;  Surgeon: Pedro Earls, MD;  Location: [facility];  Service: General;  Laterality: N/A;  . INCISION AND DRAINAGE ABSCESS N/A 12/20/2015   Procedure: INCISION AND DRAINAGE POSTERIOR NECK MASS;  Surgeon: Armandina Gemma, MD;  Location: [facility];  Service: General;  Laterality: N/A;  . INCISION AND DRAINAGE ABSCESS Left 07/10/2004   middle finger  . IR IVC FILTER PLMT / S&I /IMG GUID/MOD SED  07/21/2019  . IR VENOGRAM RENAL UNI RIGHT  07/21/2019  . MASS EXCISION N/A 07/21/2017   Procedure: EXCISION OF BENIGN NECK LESION WITH LAYERED CLOSURE;  Surgeon: Irene Limbo, MD;  Location: [facility];  Service: Plastics;  Laterality: N/A;  . MASS EXCISION N/A 11/10/2017   Procedure: EXCISION BENIGN LESION OF THE NECK WITH LAYERED CLOSURE;  Surgeon: Irene Limbo, MD;  Location: [facility];  Service: Plastics;  Laterality: N/A;    Assessment & Plan Clinical Impression: Patient is a 43 year old right-handed male with unremarkable past medical history on no prescription medications. He quit smoking 3 years ago. 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 Presented 07/19/2019 after being found down. He had dried vomit around his mouth and was covered in urine. Nonverbal not moving his left side. CT of the head showed a large right hemisphere infarction with confluence cytotoxic edema in the right  ACA and MCA territories. Evidence of large vessel occlusion. CT cervical spine negative. MRI/MRA showed right ICA occlusion. 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 Alcohol negative, urine drug screen positive marijuana, creatinine 1.43, urine culture no growth, COVID negative. Patient underwent  decompressive right hemicraniectomy with abdominal flap implant 07/19/2019 per Dr. Kathyrn Sheriff. Echocardiogram with ejection fraction of 65%. Bubble study negative for PFO. EEG with severe diffuse encephalopathy no seizure noted. Hospital course further complicated by lower extremity Doppler showing a DVT right peroneal, left popliteal, left posterior tibial and left peroneal. He underwent IVC filter placement 07/21/2019 with plan for retrieval in 8 to 12 weeks. He was not a candidate for long-term anticoagulation due to craniotomy but patient is currently maintained on subcutaneous heparin 5000 units every 8 hours. Patient n.p.o. with Cortrakfeeding tube for nutritional support and diet has been advanced to dysphagia #2 nectar liquid and nasogastric tube feeds discontinued. Keppra for seizure prophylaxis. He remained intubated 07/20/2019 through 07/25/2019. 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 He underwent aspiration on 08/21/2019 per Dr. Kathyrn Sheriff and cultures were sent with no growth to date. He was not a candidate for surgery due to increasing herniation. ID consulted placed on IV vancomycin and cefepime 08/20/2019 x6 to 8 weeks. Patient also developed Pseudomonas UTI completing a course of Zosyn. Palliative care consulted 08/22/2019 to establish goals of care.   Patient transferred to [facility] on 09/06/2019 .   Patient currently requires total with mobility secondary to muscle weakness, decreased cardiorespiratoy endurance, impaired timing and sequencing, abnormal tone, unbalanced muscle activation and decreased coordination, decreased midline orientation and decreased attention to left and decreased sitting balance, decreased  standing balance, decreased postural control, hemiplegia and decreased balance strategies.  Prior to hospitalization, patient was independent  with mobility and lived with Spouse, Family in a House home.  Home access is 3(discussed w/ wife need for ramp)Stairs to enter.  Patient will benefit from skilled PT intervention to maximize safe functional mobility, minimize fall risk and decrease caregiver burden for planned discharge home with 24 hour assist.  Anticipate patient will benefit from follow up [facility] at discharge.  PT - End of Session Activity Tolerance: Tolerates < 10 min activity, no significant change in vital signs Endurance Deficit: Yes Endurance Deficit Description: decreased PT Assessment Rehab Potential (ACUTE/IP ONLY): Fair PT Barriers to Discharge: Inaccessible home environment;Medical stability;Incontinence;Insurance for SNF coverage;Behavior PT Patient demonstrates impairments in the following area(s): Balance;Behavior;Endurance;Motor;Perception;Safety PT Transfers Functional Problem(s): Bed Mobility;Bed to Chair;Car;Furniture;Floor PT Locomotion Functional Problem(s): Stairs;Wheelchair Mobility;Ambulation PT Plan PT Intensity: Minimum of 1-2 x/day ,45 to 90 minutes PT Frequency: 5 out of 7 days PT Duration Estimated Length of Stay: 24-28 days PT Treatment/Interventions: Ambulation/gait training;Community reintegration;DME/adaptive equipment instruction;Neuromuscular re-education;UE/LE Strength taining/ROM;Stair training;Psychosocial support;Wheelchair propulsion/positioning;UE/LE Coordination activities;Therapeutic Activities;Skin care/wound management;Pain management;Functional electrical stimulation;Discharge planning;Balance/vestibular training;Cognitive remediation/compensation;Disease management/prevention;Functional mobility training;Patient/family education;Therapeutic Exercise;Splinting/orthotics;Visual/perceptual remediation/compensation PT Transfers Anticipated Outcome(s):  mod assist PT Locomotion Anticipated Outcome(s): min assist w/c propulsion PT Recommendation Follow Up Recommendations: Home health PT Patient destination: Home Equipment Recommended: To be determined  Skilled Therapeutic Intervention  Pt in supine and agreeable to therapy w/ encouragement. Pt non-verbal throughotu session, responded w/ yes/no nods. No evidence of pain. After multiple attempts at getting to EOB, realized pt was trying to communicate  to staff that he needed a brief change. R/L rolling w/ +2 assist for pericare, LE garment management, and brief change. Pt incontinent of void and BM. Supine<>sit w/ total assist +2. Maintained static sitting balance w/ max assist, heavy L lateral lean, while 2nd helper set-up stedy transfer. Stedy transfer to TIS +2 assist to stand and maintain midline orientation, however pt w/ good R side activation to pull/boost himself up to stance. Total assist to reposition in TIS. Therapist adjusted pt's headrest for improved support at pt's current cervical extension ROM but to also provide tactile cues to bring head to neutral extension in seated. Provided supervision while pt fed himself ~75% of bites, therapist providing total assist for other 25%. Educated pt and wife on stroke recovery, recovery expectations, and importance of making plans for household mobility now. Instructed pt and wife in results of PT evaluation as detailed below, PT POC, rehab potential, rehab goals, and discharge recommendations. Discussed need to make plans for temporary or permanent ramp now. Additionally discussed CIR's policies regarding fall safety and use of chair alarm and/or quick release belt. Pt verbalized understanding and in agreement. Ended session in TIS and in care of wife, all needs in reach.  PT Evaluation Precautions/Restrictions Precautions Precautions: Fall;Other (comment) Precaution Comments: crani helmet when OOB Restrictions Weight Bearing Restrictions:  No Home Living/Prior Functioning Home Living Available Help at Discharge: Family;Available 24 hours/day(wife primary caregiver, pt's parents and in-laws planning to provide help PRN) Type of Home: House Home Access: Stairs to enter [employer] of Steps: 3(discussed w/ wife need for ramp) Home Layout: Two level;Bed/bath upstairs;1/2 bath on main level  Lives With: Spouse;Family Prior Function Level of Independence: Independent with basic ADLs;Independent with homemaking with ambulation;Independent with gait;Independent with transfers  Able to Take Stairs?: Yes Driving: Yes Vocation: Full time employment 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: Impaired(L inattention) Praxis Praxis: Intact  Sensation Sensation Light Touch: Appears Intact(BLEs, difficult to formally assess 2/2 nonverbal) Coordination Gross Motor Movements are Fluid and Coordinated: No Coordination and Movement Description: dense L hemi Motor  Motor Motor: Hemiplegia;Abnormal tone;Primitive reflexes present;Abnormal postural alignment and control Motor - Skilled Clinical Observations: Dense L hemi, increased hamstring and quad tone, clonus  Mobility Bed Mobility Bed Mobility: Rolling Right;Supine to Sit;Sit to Supine;Rolling Left Rolling Right: 2 Helpers Rolling Left: 2 Helpers Supine to Sit: 2 Helpers Sit to Supine: 2 Helpers Transfers Transfers: Sit to Stand;Stand to Sit;Transfer via Scientist, research (life sciences) to Stand: 2 Helpers Stand to Sit: 2 Press photographer via Geophysicist/field seismologist: Animal nutritionist: No Gait Gait: No Stairs / Scientist, research (life sciences): No Architect: No  Trunk/Postural Assessment  Cervical Assessment Cervical Assessment: Exceptions to WFL(severe cervical protraction, R rotation bias) Thoracic Assessment Thoracic Assessment: Exceptions to WFL(rounded shoulders) Lumbar  Assessment Lumbar Assessment: Exceptions to WFL(posterior pelvic tilt) Postural Control Postural Control: Deficits on evaluation Head Control: able to correct w/ cues Trunk Control: absent, strong L lateral lean Righting Reactions: absent Protective Responses: absent  Balance Balance Balance Assessed: Yes Static Sitting Balance Static Sitting - Level of Assistance: 2: Max assist Dynamic Sitting Balance Dynamic Sitting - Level of Assistance: 2: Max assist Static Standing Balance Static Standing - Level of Assistance: 1: +2 Total assist Extremity Assessment  RLE Assessment RLE Assessment: Within Functional Limits LLE Assessment LLE Assessment: Exceptions to [facility] Passive Range of Motion (PROM) Comments: [facility] General Strength Comments: 0/5 throughout, dense L hemi    Refer to  Care Plan for Long Term Goals  Recommendations for other services: None   Discharge Criteria: Patient will be discharged from PT if patient refuses treatment 3 consecutive times without medical reason, if treatment goals not met, if there is a change in medical status, if patient makes no progress towards goals or if patient is discharged from hospital.  The above assessment, treatment plan, treatment alternatives and goals were discussed and mutually agreed upon: by patient and by family  Syrianna Schillaci Clent Demark 09/07/2019, 2:34 PM

## 2019-09-06 NOTE — H&P (Signed)
Physical Medicine and Rehabilitation Admission H&P        Chief Complaint  Patient presents with  . Altered Mental Status  : HPI: Matthew Black. Countess is a 43 year old right-handed male with unremarkable past medical history on no prescription medications.  He quit smoking 3 years ago.  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  His wife, mother and stepfather can assist.  Presented 07/19/2019 after being found down.  He had dried vomit around his mouth and was covered in urine.  Nonverbal not moving his left side.  CT of the head showed a large right hemisphere infarction with confluence cytotoxic edema in the right ACA and MCA territories.  Evidence of large vessel occlusion.  CT cervical spine negative.  MRI/MRA showed right ICA occlusion.  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  Alcohol negative, urine drug screen positive marijuana, creatinine 1.43, urine culture no growth, COVID negative.  Patient underwent decompressive right hemicraniectomy with abdominal flap implant 07/19/2019 per Dr. Kathyrn Sheriff.  Echocardiogram with ejection fraction of 65%.  Bubble study negative for PFO.  EEG with severe diffuse encephalopathy no seizure noted.  Hospital course further complicated by lower extremity Doppler showing a DVT right peroneal, left popliteal, left posterior tibial and left peroneal.  He underwent IVC filter placement 07/21/2019 with plan for retrieval in 8 to 12 weeks.  He was not a candidate for long-term anticoagulation due to craniotomy but patient is currently maintained on subcutaneous heparin 5000 units every 8 hours.  Patient n.p.o. with Cortrak feeding tube for nutritional support and diet has been advanced to dysphagia #2 nectar liquid and nasogastric tube feeds discontinued.  Keppra for seizure prophylaxis.  He remained intubated 07/20/2019 through 07/25/2019.  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  He underwent aspiration on 08/21/2019 per Dr. Kathyrn Sheriff and cultures were sent with no growth to date.  He was not a candidate for surgery due to increasing herniation.  ID consulted placed  on IV vancomycin and cefepime 08/20/2019 x6 to 8 weeks.  Patient also developed Pseudomonas UTI completing a course of Zosyn.  Palliative care consulted 08/22/2019 to establish goals of care.  Therapy evaluations completed and patient was admitted for a comprehensive rehab program.   Review of Systems  Unable to perform ROS: Language        Past Medical History:  Diagnosis Date  . 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 CRANIOTOMY Right 07/19/2019    Procedure: RIGHT HEMI-CRANIECTOMY With implantation of skull flap to abdominal wall;  Surgeon: Consuella Lose, MD;  Location: [facility];  Service: Neurosurgery;  Laterality: Right;  . CYST EXCISION N/A 10/08/2016    Procedure: EXCISION OF POSTERIOR NECK CYST;  Surgeon: Clovis Riley, MD;  Location: [facility];  Service: General;  Laterality: N/A;  . INCISION AND DRAINAGE ABSCESS N/A 09/22/2014    Procedure: INCISION AND DRAINAGE ABSCESS POSTERIOR NECK;  Surgeon: Pedro Earls, MD;  Location: [facility];  Service: General;  Laterality: N/A;  . INCISION AND DRAINAGE ABSCESS N/A 12/20/2015    Procedure: INCISION AND DRAINAGE POSTERIOR NECK MASS;  Surgeon: Armandina Gemma, MD;  Location: [facility];  Service: General;  Laterality: N/A;  . INCISION AND DRAINAGE ABSCESS Left 07/10/2004    middle finger  . IR IVC FILTER PLMT / S&I /IMG GUID/MOD SED   07/21/2019  . IR VENOGRAM RENAL UNI RIGHT   07/21/2019  . MASS EXCISION N/A 07/21/2017     Procedure: EXCISION OF BENIGN NECK LESION WITH LAYERED CLOSURE;  Surgeon: Irene Limbo, MD;  Location: [facility];  Service: Plastics;  Laterality: N/A;  . MASS EXCISION N/A 11/10/2017    Procedure: EXCISION BENIGN LESION OF THE NECK WITH LAYERED CLOSURE;  Surgeon: Irene Limbo, MD;  Location: [facility];  Service: Plastics;  Laterality: N/A;         Family History  Problem Relation Age of Onset  . Diabetes Other          GF  . Healthy Mother    . Healthy Father      Social History:  reports that he quit smoking about 3 years ago. He smoked 0.00 packs per day. He has never used smokeless tobacco. He reports current alcohol use. He reports that he does not use drugs. Allergies:      Allergies  Allergen Reactions  . Hydrocodone Nausea Only          Medications Prior to Admission  Medication Sig Dispense Refill  . ibuprofen (ADVIL,MOTRIN) 800 MG tablet Take 1 tablet (800 mg total) by mouth 3 (three) times daily. (Patient not taking: Reported on 07/19/2019) 21 tablet 0  . oseltamivir (TAMIFLU) 75 MG capsule Take 1 capsule (75 mg total) by mouth every 12 (twelve) hours. (Patient not taking: Reported on 07/19/2019) 10 capsule 0      Drug Regimen Review Drug regimen was reviewed and remains appropriate with no significant issues identified   Home: Home Living Family/patient expects to be discharged to:: Unsure Living Arrangements: Spouse/significant other Available Help at Discharge: Family Additional Comments: list wife in chart   Lives With: Spouse, Family(2 kids)   Functional History: Prior Function Level of Independence: Independent Comments: working   Functional Status:  Mobility: Bed Mobility Overal bed mobility: Needs Assistance Bed Mobility: Rolling, Sidelying to Sit, Sit to Sidelying Rolling: Mod assist, Max assist Sidelying to sit: Max assist, +2 for physical assistance Supine to sit: Total assist, HOB elevated, +2 for physical  assistance Sit to supine: Max assist, +2 for physical assistance General bed mobility comments: pt initiates with R UE but erquires mod/maxA to complete task. maxA to push up into sitting EOB Transfers Overall transfer level: Needs assistance Equipment used: (stedy) Transfer via Lift Equipment: Maximove Transfers: Sit to/from Stand Sit to Stand: Max assist, +2 physical assistance General transfer comment: used stedy x3 trials, completed sit to stand with gait belt and bed pad below bottom, pt with L knee blocked, able to achieve full upright position but unable to maintain. pt contintues with strong lean to the L requiring max assist to maintain midline Ambulation/Gait General Gait Details: unable   ADL: ADL Overall ADL's : Needs assistance/impaired Grooming: Wash/dry face, Sitting, Moderate assistance Grooming Details (indicate cue type and reason): tactile cues and hand over hand assist to initiate task with RUE Toileting- Clothing Manipulation and Hygiene: Total assistance, +2 for physical assistance, Bed level Toileting - Clothing Manipulation Details (indicate cue type and reason): total A for toilet hygiene at bed level atfer bowel incontience.  Functional mobility during ADLs: Total assistance, +2 for physical assistance, +2 for safety/equipment(Maxi  move to chair) General ADL Comments: session focus on functional mobility and seated ADLS in TIS to attempt to increase level of arousal   Cognition: Cognition Overall Cognitive Status: Impaired/Different from baseline Arousal/Alertness: Lethargic Orientation Level: Other (comment)(UTA pt no speaking to RN ) Attention: Focused Focused Attention: Impaired Focused Attention Impairment: Functional basic, Verbal basic Memory: (Unable to assess due to lethargy. TBA) Awareness: Impaired Awareness Impairment: Emergent impairment Problem Solving: (to be assessed further ) Safety/Judgment: Impaired [employer] Scales of Cognitive  Functioning: (not a brain injury) Cognition Arousal/Alertness: Awake/alert Behavior During Therapy: Flat affect Overall Cognitive Status: Impaired/Different from baseline Area of Impairment: Attention, Safety/judgement, Following commands Current Attention Level: Sustained Following Commands: Follows one step commands consistently, Follows one step commands with increased time Safety/Judgement: Decreased awareness of deficits(L sided neglect) Awareness: Intellectual Problem Solving: Slow processing, Difficulty sequencing, Requires verbal cues, Requires tactile cues General Comments: pt following all commands on the R, unable ont he L, con't to have L sided neglect and inability to self correct, remains non-verbal Difficult to assess due to: Impaired communication   Physical Exam: Blood pressure (!) 129/91, pulse 90, temperature 99.1 F (37.3 C), temperature source Axillary, resp. rate 18, height 5\' 11"  (1.803 m), weight 92.4 kg, SpO2 100 %. Physical Exam  Constitutional: He appears well-developed and well-nourished. No distress.  HENT:  Mouth/Throat: Oropharynx is clear and moist. No oropharyngeal exudate.  Craniectomy site with small amount of drainage, mild swelling  Eyes: Pupils are equal, round, and reactive to light. Conjunctivae and EOM are normal. Right eye exhibits no discharge. Left eye exhibits no discharge.  Neck: Normal range of motion. No tracheal deviation present. No thyromegaly present.  Cardiovascular: Normal rate and regular rhythm. Exam reveals no friction rub.  No murmur heard. Respiratory: Effort normal and breath sounds normal. No respiratory distress. He has no wheezes. He has no rales.  GI: Soft. He exhibits no distension. There is no abdominal tenderness.  Musculoskeletal: Normal range of motion.     Comments: Left central 7. Does track to all visual fields although has right gaze preference. Non-verbal. Dense left hemiparesis. 0/5 LUE and LLE. At least 3-4/5 RUE  and RLE. Senses pain on right side, minimal withdrawal on left. DTR's 3+ LUE, LLE, toes up.   Neurological: He is alert.  Patient alert and resting comfortably.  Makes eye contact as I entered the room.  He remained nonverbal throughout exam.  He does give thumbs up for responses and can provide yes and no head nods fairly consistently  Skin: Skin is warm and dry.  Psychiatric:  Flat, cooperative      Lab Results Last 48 Hours        Results for orders placed or performed during the hospital encounter of 07/19/19 (from the past 48 hour(s))  Comprehensive metabolic panel     Status: Abnormal    Collection Time: 09/04/19  5:55 AM  Result Value Ref Range    Sodium 137 135 - 145 mmol/L    Potassium 3.2 (L) 3.5 - 5.1 mmol/L    Chloride 103 98 - 111 mmol/L    CO2 24 22 - 32 mmol/L    Glucose, Bld 106 (H) 70 - 99 mg/dL    BUN 6 6 - 20 mg/dL    Creatinine, Ser 0.60 (L) 0.61 - 1.24 mg/dL    Calcium 8.9 8.9 - 10.3 mg/dL    Total Protein 6.3 (L) 6.5 - 8.1 g/dL    Albumin 2.7 (L) 3.5 -  5.0 g/dL    AST 26 15 - 41 U/L    ALT 64 (H) 0 - 44 U/L    Alkaline Phosphatase 116 38 - 126 U/L    Total Bilirubin 0.7 0.3 - 1.2 mg/dL    GFR calc non Af Amer >60 >60 mL/min    GFR calc Af Amer >60 >60 mL/min    Anion gap 10 5 - 15      Comment: Performed at [facility] [address] [place], [place] [address]  Magnesium     Status: Abnormal    Collection Time: 09/04/19  5:55 AM  Result Value Ref Range    Magnesium 1.6 (L) 1.7 - 2.4 mg/dL      Comment: Performed at [facility] [address] [place], [place] [address]  Glucose, capillary     Status: None    Collection Time: 09/04/19  8:08 AM  Result Value Ref Range    Glucose-Capillary 85 70 - 99 mg/dL    Comment 1 Notify RN      Comment 2 Document in Chart    Glucose, capillary     Status: Abnormal    Collection Time: 09/04/19 12:21 PM  Result Value Ref Range    Glucose-Capillary 103 (H) 70 - 99 mg/dL  Glucose, capillary      Status: None    Collection Time: 09/04/19  5:13 PM  Result Value Ref Range    Glucose-Capillary 86 70 - 99 mg/dL  Glucose, capillary     Status: Abnormal    Collection Time: 09/04/19  8:24 PM  Result Value Ref Range    Glucose-Capillary 118 (H) 70 - 99 mg/dL    Comment 1 Notify RN      Comment 2 Document in Chart    Glucose, capillary     Status: None    Collection Time: 09/04/19 11:57 PM  Result Value Ref Range    Glucose-Capillary 71 70 - 99 mg/dL    Comment 1 Notify RN      Comment 2 Document in Chart    Glucose, capillary     Status: None    Collection Time: 09/05/19  3:56 AM  Result Value Ref Range    Glucose-Capillary 94 70 - 99 mg/dL    Comment 1 Notify RN      Comment 2 Document in Chart    CBC     Status: Abnormal    Collection Time: 09/05/19  5:00 AM  Result Value Ref Range    WBC 5.5 4.0 - 10.5 K/uL    RBC 4.14 (L) 4.22 - 5.81 MIL/uL    Hemoglobin 11.6 (L) 13.0 - 17.0 g/dL    HCT 34.9 (L) 39.0 - 52.0 %    MCV 84.3 80.0 - 100.0 fL    MCH 28.0 26.0 - 34.0 pg    MCHC 33.2 30.0 - 36.0 g/dL    RDW 14.7 11.5 - 15.5 %    Platelets 242 150 - 400 K/uL    nRBC 0.0 0.0 - 0.2 %      Comment: Performed at [facility], [place] [address] [place], [place] [id number]  Basic metabolic panel     Status: Abnormal    Collection Time: 09/05/19  5:00 AM  Result Value Ref Range    Sodium 137 135 - 145 mmol/L    Potassium 3.6 3.5 - 5.1 mmol/L    Chloride 102 98 - 111 mmol/L    CO2 26  22 - 32 mmol/L    Glucose, Bld 100 (H) 70 - 99 mg/dL    BUN 6 6 - 20 mg/dL    Creatinine, Ser 0.58 (L) 0.61 - 1.24 mg/dL    Calcium 9.0 8.9 - 10.3 mg/dL    GFR calc non Af Amer >60 >60 mL/min    GFR calc Af Amer >60 >60 mL/min    Anion gap 9 5 - 15      Comment: Performed at [facility] [address], [place], [place] [address]  Glucose, capillary     Status: Abnormal    Collection Time: 09/05/19 11:10 AM  Result Value Ref Range    Glucose-Capillary 111 (H) 70 - 99 mg/dL  Glucose,  capillary     Status: Abnormal    Collection Time: 09/05/19  4:50 PM  Result Value Ref Range    Glucose-Capillary 107 (H) 70 - 99 mg/dL  Glucose, capillary     Status: Abnormal    Collection Time: 09/05/19  7:51 PM  Result Value Ref Range    Glucose-Capillary 117 (H) 70 - 99 mg/dL    Comment 1 Notify RN      Comment 2 Document in Chart    Glucose, capillary     Status: None    Collection Time: 09/05/19 11:36 PM  Result Value Ref Range    Glucose-Capillary 79 70 - 99 mg/dL    Comment 1 Notify RN      Comment 2 Document in Chart    Glucose, capillary     Status: Abnormal    Collection Time: 09/06/19  3:31 AM  Result Value Ref Range    Glucose-Capillary 115 (H) 70 - 99 mg/dL    Comment 1 Notify RN      Comment 2 Document in Chart       Imaging Results (Last 48 hours)  No results found.           Medical Problem List and Plan: 1.  Left-sided weakness/dysphagia secondary to right MCA/ACA infarction with R ICA,R MCA occlusion with cerebral edema status post hemicraniectomy with abdominal flap implant [id number] complicated by cerebral abscess/empyema hemorrhagic conversion             -admit to inpatient rehab                      -ELOS 3-4 weeks, mod assist goals 2.  Antithrombotics: -DVT/anticoagulation: Right peroneal, left popliteal, left posterior tibial and left peroneal DVT.  Status post IVC filter 07/21/2019 with plan for retrieval 8 to 12 weeks.  Patient currently remains on subcutaneous heparin.  No plan for long-term anticoagulation             -antiplatelet therapy: N/A 3. Pain Management: Tramadol as needed 4. Mood: Provide motional support             -antipsychotic agents: N/A 5. Neuropsych: This patient is not capable of making decisions on his own behalf. 6. Skin/Wound Care: Routine skin checks 7. Fluids/Electrolytes/Nutrition: Routine in and outs.              -monitor closely given diet/current neuro status 8.  Dysphagia.  Dysphagia #2 with nectar liquids.   Follow-up speech therapy             -will need assistance with meals             -encourage po 9.  Seizure prophylaxis.  Keppra 500 mg twice daily 10.  Cerebral abscess/empyema/cerebritis.  Cultures no growth to date.  Continue vancomycin and cefepime 08/20/2019 x 6 to 8 weeks per infectious disease. 11.  Pseudomonas UTI.  Zosyn completed 12.  Hypertension with tachycardia.  Lopressor 50 mg every 8 hours. 13.  History of tobacco marijuana use.  Urine drug screen positive marijuana.  Provide counseling with family     Elizabeth Sauer 09/06/2019  I have personally performed a face to face diagnostic evaluation of this patient and formulated the key components of the plan.  Additionally, I have personally reviewed laboratory data, imaging studies, as well as relevant notes and concur with the physician assistant's documentation above.  The patient's status has not changed from the original H&P.  Any changes in documentation from the acute care chart have been noted above.  Meredith Staggers, MD, [facility]

## 2019-09-06 NOTE — Progress Notes (Signed)
Inpatient Rehabilitation Admissions Coordinator  I have an inpt rehab bed available to admit patient to today. I contacted Dr. Rodena Piety and she will make the arrangements to d/c him to CIR today. I will contact his wife to make the arrangements. RN CM and SW made aware.  Danne Baxter, RN, MSN Rehab Admissions Coordinator [phone] 09/06/2019 9:55 AM

## 2019-09-06 NOTE — Discharge Summary (Signed)
Physician Discharge Summary  Matthew Black [id number] DOB: 10/19/1976 DOA: 07/19/2019  PCP: Colon Branch, MD  Admit date: 07/19/2019 Discharge date: 09/06/2019  Admitted From: Home Disposition: CIR Recommendations for Outpatient Follow-up:  1. Follow up with PCP in 1-2 weeks 2. Please obtain BMP/CBC in one week 3    Please follow up with infectious disease as an outpatient   Home Health: None  equipment/Devices none Discharge Condition stable  CODE STATUS full code Diet recommendation: Cardiac   brief/Interim Summary:43 year old gentleman with no significant past medical history who was found down x2 days nonverbal with left hemiparesis. 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 Patient status post hemicraniectomy with abdominal flap implant. Patient subsequently underwent a hemorrhagic conversion with hematoma 07/30/2019 which worsened on 9/20 with right infarct area. Patient was seen and followed by neurology in the ICU. Patient subsequently underwent right decompressive hemicraniectomy by Dr. Kathyrn Sheriff with flap on 07/19/2019. 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 Patient underwent aspiration on 08/21/2019 per Dr. Kathyrn Sheriff and cultures sent. Patient noted to be not a surgical candidate due to increasing herniation. ID was consulted and patient started empirically on IV vancomycin IV cefepime. Patient also noted to have a bilateral lower extremity DVT and small left lower lobe PE per venous Dopplers and CT angiogram. Heparin was started on 07/26/2019. IVC filter placed 07/21/2019 with plan retrieval 8 to 12 weeks if patient  still hospitalized or in the outpatient IR clinic. Hypercoagulable work-up done was negative. Oncology consulted. Patient was on IV heparin however subsequently discontinued due to intracranial hemorrhage and then subsequently resumed given extensive DVT and PE but then subsequently discontinued due to worsening hemorrhage on 08/14/2019. Patient also noted to have abdominal wall hematoma. Patient also noted to have some seizure-like activity subsequently placed on seizure precautions and on Keppra. Patient status post treatment of Pseudomonas UTI with Zosyn. Noted with dysphagia with speech therapy following. Patient subsequently transferred to tried service on 08/25/2019 with neurology following. 09/01/2019-patient awake follows simple commands good eye contact Nodding head yes and no  09/02/2019-staff feeding the patient he is awake alert and swallowing remains aphasic moves right upper and lower extremities  10/10-no new events overnight  09/05/2019 patient is eating almost all of his meals daily.  Wife comes in the evening feeds him at dinner and stays till 8:00 I was told by the staff.  She works during the day.  Staff noticed yellow drainage from his right side of his scalp.  He says it hurts.  When asked him whether it hurts he noted his head yes.  09/06/2019 patient is being fed by the staff this morning.  He is awake alert.  He answers questions yes or no by nodding his head.  When I told him he is going to rehab today he noted his head yes.  I do not see any drainage from the right side of his scalp today.    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 Questionable etiology per neurology concerned that patient may have had a possible right ICA dissection from exercise. Patient noted to have hemorrhagic conversion with hematoma 07/30/2019 with worsening on 08/14/2019 in the right infarct. Patient followed by neurology throughout the hospitalization.  Neurology signed off  09/03/2019. Patient had head CT done that showed a large right brain infarct with edema right ACA and MCA territories. MRI showed large right brain infarct with cytotoxic edema but no hemorrhage. MRA head and neck showed right ICA occluded at origin. Right ICA terminus, R ACA, R MCA occluded. Head CT which was done showed 5 to 6 mm midline shift with no hemorrhage. Patient underwent acute head CT that showed progression with swelling decompressive craniectomy defect. CTA head and neck was done that showed right ICA occlusion neck that continues into right ACA and MCA. CT head done 07/23/2019 showed unchanged appearance of massive right MCA and ACA territory infarct with parenchyma extending through decompressive craniectomy. Small focus of suspected hemorrhage adjacent to right caudate head. CT head done 07/30/2019 with new hematoma within infarcted tissue. CT head done 08/01/2019 with no significant change. CT head done 08/11/2019 with new patchy hemorrhage along periphery of infarcted right frontal parietal lobe, decreased dominant anterior margin hemorrhage. Stable herniation of infarcted brain through craniectomy site. CT head done 08/14/2019 with worsening with increased hemorrhagic transformation on the right and new IVH. CT head 08/15/2019 slightly  worsened hemorrhagic transformation from previous. CT head done 08/16/2019 with hemorrhagic right ACA and MCA territory infarct with edematous brain bulging through Crani defect. CT head 08/18/2019 with stable hematoma, increased right hemisphere mass-effect, external herniation from Crani site, with market right lateral ventriculomegaly than prior images. 2D echo done with a EF of 60 to 65% of emboli. 2D echo with bubble study ordered was negative for PFO. Lower extremity Dopplers done showed bilateral DVT.  Patient was anticoagulant coagulated with IV heparin due to extensive DVT and PE this was stopped due to worsening hemorrhagic transformation.  Patient to be admitted to CIR today.  #2 cytotoxic cerebral edema-Status post right decompressive hemicraniectomy per Dr. Kathyrn Sheriff with right flap 07/19/2019.Patient has had multiple CT scans as a follow-up. The last CT scan was done 08/18/2019 with stable hematoma increased right hemisphere mass-effect external herniation from the [place] site.No surgery planned   #3 cerebral abscess/empyema/cerebritis status post aspiration and cultures with no growth to date. Due to increasing herniation patient was thought not to be a surgical candidate. Patient was started on Vanco and cefepime on 08/20/2019 with duration of therapy 6 to 8 weeks per infectious disease.Patient will need follow-up CT scan prior to discontinuation of IV antibiotics and notify infectious disease prior to discharge and will need follow-up outpatient ID.    No drainage to the right side of the scalp which has resolved.    #4 bilateral lower extremity DVT and small left lower lobe pulmonary embolism-patient treated with IV heparin which was subsequently stopped due to increasinghemorrhagictransformation/intracranial hemorrhage. Patient has an IVC filter placed 07/21/2019. This  will need to be retrieved in 8 to 12 weeks per IR. He will need outpatient follow-up if he is not in  the hospital by then. This is going to be sometime by the end of October.   #5?Seizure-like activity continue Keppra EEG showed cortical dysfunction in the right frontal region. No seizure activity reported by the staff.  #6 abdominal wall hematoma status post aspirationno growth. #7 Pseudomonas UTIpatient received a course of Zosyn.  #8 dysphagia due to acute stroke-patient eating more than 75%. Tube feeds were stopped10/06/2019.dc cprtrack  Tube.  Nutrition Problem: Inadequate oral intake Etiology: acute illness    Signs/Symptoms: NPO status     Interventions: Tube feeding, Prostat, Ensure Enlive (each supplement provides 350kcal and 20 grams of protein)  Estimated body mass index is 28.41 kg/m as calculated from the following:   Height as of this encounter: 5\' 11"  (1.803 m).   Weight as of this encounter: 92.4 kg.  Discharge Instructions  Discharge Instructions    Ambulatory referral to Neurology   Complete by: As directed    Follow up with [facility]ie [facility] in 4-6 weeks. Too complicated for NP to follow. Thanks.   Diet - low sodium heart healthy   Complete by: As directed    Increase activity slowly   Complete by: As directed      Allergies as of 09/06/2019      Reactions   Hydrocodone Nausea Only      Medication List    STOP taking these medications   ibuprofen 800 MG tablet Commonly known as: ADVIL   oseltamivir 75 MG capsule Commonly known as: TAMIFLU     TAKE these medications   famotidine 20 MG tablet Commonly known as: PEPCID Take 1 tablet (20 mg total) by mouth 2 (two) times daily.   levETIRAcetam 500 MG tablet Commonly known as: KEPPRA Take 1 tablet (500 mg total) by mouth 2 (two) times daily.   metoprolol tartrate 50 MG tablet Commonly known as: LOPRESSOR Take 1 tablet (50 mg total) by mouth every 8 (eight) hours.   ondansetron 4 MG/2ML Soln injection Commonly known as: ZOFRAN Inject 2 mLs (4 mg total) into the vein every 6  (six) hours as needed for nausea or vomiting.   senna-docusate 8.6-50 MG tablet Commonly known as: Senokot-S Place 1 tablet into feeding tube at bedtime as needed for mild constipation.      Follow-up Information    Markus Daft, MD Follow up.   Specialties: Interventional Radiology, Radiology Why: IR scheduler will call you with appointment date and time to discuss IVC filter removal (typically 8-12 weeks after placement). Please call with questions or concerns prior to your appointment. Contact information: [address] [place] [address] [phone]        Garvin Fila, MD Follow up.   Specialties: Neurology, Radiology Contact information: [address] [place] [address] [phone]          Allergies  Allergen Reactions  . Hydrocodone Nausea Only    Consultations: Infectious disease  neurosurgery, oncology, palliative care, PCCM  Procedures/Studies: Ct Head Wo Contrast  Result Date: 08/20/2019 CLINICAL DATA:  Follow-up examination for intracranial hemorrhage. EXAM: CT HEAD WITHOUT CONTRAST TECHNIQUE: Contiguous axial images were obtained from the base of the skull through the vertex without intravenous contrast. COMPARISON:  Prior CT from 08/18/2019. FINDINGS: Brain: Extensive evolving cytotoxic edema related to large right ACA and MCA territory infarcts again seen. Associated parenchymal swelling with  herniation of the brain through the right craniectomy defect again seen. There is a 1.9 x 7.5 cm pocket of fluid and/or collection at the anterior margin of the craniectomy defect (series 3, image 23, with discontinuity of the underlying dura, similar to previous. Finding could reflect CSF leak. There is deviation of the frontal horn of the right lateral ventricle towards the craniectomy defect and this collection, which may be in communication (a series 5, image 29). Similar subgaleal fluid pocket at the posterior craniectomy margin  measures 3.9 cm, also similar. These collections may be contiguous superiorly (series 3, image 33). Overall, appearance is little interval changed. Superimposed scattered intraparenchymal hemorrhage within the infarcted brain is little interval changed. Associated intraventricular extension with blood throughout the right greater than left lateral ventricles. 6 mm left-to-right shift related to herniation, similar. No hydrocephalus. Basilar cisterns remain patent. No other new acute intracranial hemorrhage. No other acute large vessel territory infarct. Vascular: No hyperdense vessel. Skull: Previous right craniectomy. Sinuses/Orbits: Globes and orbital soft tissues demonstrate no acute finding. Paranasal sinuses remain clear. No mastoid effusion. Other: None. IMPRESSION: 1. No significant interval change since previous exam from 08/18/2019. 2. Continued interval evolution of hemorrhagic right ACA and MCA territory infarcts with extensive edema and herniation through the right craniectomy defect. 3. Irregular collections about the craniectomy site with question of disruption of the underlying dura, which could reflect sequelae of underlying CSF leak. Correlation with physical exam and fluid sampling suggested for further evaluation. 4. No significant interval change in intraventricular hemorrhage. 5. Similar 6 mm left-to-right shift related to herniation. Basilar cisterns remain patent. 6. No other new acute intracranial abnormality. Electronically Signed   By: Jeannine Boga M.D.   On: 08/20/2019 05:16   Ct Head Wo Contrast  Result Date: 08/18/2019 CLINICAL DATA:  Intracranial hemorrhage, known, follow-up. EXAM: CT HEAD WITHOUT CONTRAST TECHNIQUE: Contiguous axial images were obtained from the base of the skull through the vertex without intravenous contrast. COMPARISON:  Head CT 08/16/2019 at 3:35 a.m. FINDINGS: Brain: Again demonstrated, extensive right ACA and MCA territory infarct with associated  swelling in the right cerebral hemisphere and marked bulging through the craniectomy defect. Extensive parenchymal hemorrhage throughout much of the right MCA territory, similar to prior exam. Also similar to prior exam, there is moderate intraventricular hemorrhage although with some interval redistribution. Lateral ventriculomegaly appears somewhat increased from prior examination. The patchy distribution of hemorrhage makes it difficult to provide measurements. The hemorrhage is multifocal with pockets of fluid levels. 7 mm rightward midline shift measured at the level of the septum pellucidum related to right sided external herniation, unchanged. No new demarcated infarction identified. Vascular: No change. Skull: Right-sided craniectomy. Sinuses/Orbits: Visualized orbits demonstrate no acute abnormality. No significant paranasal sinus disease. No significant mastoid effusion. Partially visualized support tubes. Impression #1 below will be called to the ordering clinician or representative by the Radiologist Assistant, and communication documented in the PACS or zVision Dashboard. IMPRESSION: 1. Lateral ventriculomegaly appears somewhat increased from prior examination. Consider short interval CT follow-up. 2. Hemorrhagic right ACA and MCA territory infarcts with right cerebral swelling and external herniation through right-sided craniectomy defect, similar to prior exam. Intraventricular hemorrhage has not significantly changed in amount, although with some interval redistribution. 3. Unchanged 7 mm rightward midline shift related to right-sided external herniation. Electronically Signed   By: Kellie Simmering   On: 08/18/2019 16:29   Ct Head Wo Contrast  Result Date: 08/16/2019 CLINICAL DATA:  Follow-up intracranial hemorrhage EXAM: CT HEAD  WITHOUT CONTRAST TECHNIQUE: Contiguous axial images were obtained from the base of the skull through the vertex without intravenous contrast. COMPARISON:  Two days ago  FINDINGS: Brain: Extensive right ACA and MCA infarct related swelling in the right hemisphere with marked bulging through a craniectomy defect. Extensive parenchymal hemorrhage throughout much of the MCA territory with more moderate intraventricular hemorrhage which is unchanged in extent. Patchy nature of the hemorrhage defied is reproducible measurement. The hemorrhage appears multifocal with pockets of fluid levels. Stable lateral ventriculomegaly. No midline shift. No evidence of interval infarct. Vascular: Stable Skull: Large craniectomy defect as noted above. Sinuses/Orbits: Negative IMPRESSION: 1. Hemorrhagic right ACA and MCA territory infarcts with swollen brain bulging through the craniectomy defect. Intraventricular hemorrhage with ventriculomegaly. No change from 2 days ago. Electronically Signed   By: Monte Fantasia M.D.   On: 08/16/2019 05:55   Ct Head Wo Contrast  Result Date: 08/15/2019 CLINICAL DATA:  Follow-up examination for intracranial hemorrhage. EXAM: CT HEAD WITHOUT CONTRAST TECHNIQUE: Contiguous axial images were obtained from the base of the skull through the vertex without intravenous contrast. COMPARISON:  Prior CT from earlier the same day. FINDINGS: Brain: Postoperative changes from prior right frontal, parietal, and temporal craniectomy. Swelling and herniation of the infarcted brain through the craniectomy defect again seen, relatively similar. Associated hemorrhagic transformation with associated hemorrhage throughout the right frontal, parietal, and temporal occipital region again seen, with dominant hematoma measuring approximately 10.1 x 5.5 x 5.9 cm, slightly worsened from previous. Associated intraventricular extension with blood throughout the ventricular system, similar. Mass effect on the right lateral ventricle which is partially compressed, also relatively similar. No worsening hydrocephalus or ventricular trapping. Basilar cisterns remain patent. No other new  hemorrhage or infarction within the left cerebral hemisphere or cerebellum. No extra-axial fluid collection. Vascular: No new finding. Skull: Prior right craniectomy with overlying soft tissue swelling and edema. Skin staples remain in place. Sinuses/Orbits: Globes and orbital soft tissues demonstrate no acute finding. Paranasal sinuses are largely clear. No mastoid effusion. Nasogastric tube partially visualized. Other: None. IMPRESSION: 1. Postoperative changes from prior right craniectomy with extensive mass effect and herniation of the infarcted brain through the craniectomy defect, similar to previous. 2. Associated hemorrhagic transformation within the infarcted right cerebral hemisphere with dominant hematoma measuring up to 10.1 x 5.5 x 5.9 cm, slightly worsened from previous. Associated intraventricular extension and mass effect on the right lateral ventricle without worsening hydrocephalus or ventricular trapping, similar to previous. 3. No other new acute intracranial abnormality. Electronically Signed   By: Jeannine Boga M.D.   On: 08/15/2019 01:03   Ct Head Wo Contrast  Result Date: 08/14/2019 CLINICAL DATA:  Altered level of consciousness. EXAM: CT HEAD WITHOUT CONTRAST TECHNIQUE: Contiguous axial images were obtained from the base of the skull through the vertex without intravenous contrast. COMPARISON:  Head CT dated 08/11/2019. FINDINGS: Brain: Significant increase in the amount of acute hemorrhage along the periphery of patient's infarcted RIGHT hemisphere with associated increase in mass effect causing increased herniation through the craniectomy defect. The mass effect is also causing increased compression of the RIGHT lateral ventricle and there is now intraventricular extension of the hemorrhage. No parenchymal hemorrhage or edema withinw the LEFT hemisphere posterior fossa. Aforementioned intraventricular hemorrhage extends to the level of the fourth ventricle. No appreciable change  of the slight leftward midline shift. No evidence of tonsillar herniation. Vascular: No acute findings. Skull: Stable appearance of the RIGHT-sided craniectomy. No acute findings. Sinuses/Orbits: No acute finding. Other: None.  IMPRESSION: 1. Significant increase in the amount of acute hemorrhage along the periphery of patient's infarcted RIGHT hemisphere, most prominently developed adjacent to the posterior margin of the craniectomy site. There is associated increase in mass effect causing increased herniation through the craniectomy defect. The mass effect is also causing increased compression of the RIGHT lateral ventricle. 2. New intraventricular extension of the acute hemorrhage. 3. No appreciable change of the mild leftward midline shift. No evidence of tonsillar herniation. These results will be called to the ordering clinician or representative by the Radiologist Assistant, and communication documented in the PACS or zVision Dashboard. Electronically Signed   By: Franki Cabot M.D.   On: 08/14/2019 14:12   Ct Head Wo Contrast  Result Date: 08/11/2019 CLINICAL DATA:  Cerebral hemorrhage suspected. History of stroke, follow-up. EXAM: CT HEAD WITHOUT CONTRAST TECHNIQUE: Contiguous axial images were obtained from the base of the skull through the vertex without intravenous contrast. COMPARISON:  Head CTs dated 08/01/2019, 07/30/2019 and 07/23/2019 FINDINGS: Brain: Again noted is the RIGHT frontal-parietal-temporal craniectomy. There is stable herniation of infarcted brain through the craniectomy site. The previously described hematoma anteriorly within the infarcted RIGHT frontal lobe has decreased in size and density, compatible with expected evolution, now measuring 2 cm greatest dimension. There are new patchy parenchymal hemorrhages along the periphery of the infarcted brain, compatible with hemorrhagic conversion, largest underlying the RIGHT frontal bone measures 1.3 cm greatest dimension. No additional  mass effect or midline shift on today's exam. No LEFT sided intracranial hemorrhage or edema appreciated. Ventricles are stable in size and configuration. Vascular: No hyperdense vessel or unexpected calcification. Skull: As above. Sinuses/Orbits: No acute finding. Other: None. IMPRESSION: 1. New patchy parenchymal hemorrhages along the periphery of the infarcted RIGHT frontoparietal lobe, compatible with hemorrhagic conversion, largest focus underlying the RIGHT frontal bone measures 1.3 cm greatest dimension. 2. Previously described dominant focus of hemorrhage along the anterior margin of the craniectomy site has decreased in size and density compared to previous exams, compatible with expected evolution. 3. Stable herniation of infarcted brain through the RIGHT frontal-parietal-temporal craniectomy site. 4. No additional mass effect or midline shift on today's exam. Electronically Signed   By: Franki Cabot M.D.   On: 08/11/2019 12:27   Mr Jeri Cos [id number] Contrast  Result Date: 08/20/2019 CLINICAL DATA:  Postoperative craniotomy. Stroke. Fever with drainage from craniotomy site. EXAM: MRI HEAD WITHOUT AND WITH CONTRAST TECHNIQUE: Multiplanar, multiecho pulse sequences of the brain and surrounding structures were obtained without and with intravenous contrast. CONTRAST:  104mL GADAVIST GADOBUTROL 1 MMOL/ML IV SOLN COMPARISON:  Multiple prior studies. CT head 08/20/2019. MRI head 07/19/2019 FINDINGS: Brain: Right hemispheric infarction with hemorrhage. No acute blood products on CT yesterday. Extensive edema throughout the right hemisphere. Large craniectomy defect on the right with brain herniation through the defect. Mild midline shift to the right. Mild lateral ventricle dilatation, unchanged from yesterday. Mild midline shift to the right due to craniectomy decompression. Small amount of blood in the left occipital horn. Small amount of blood in the third ventricle. Large amount of hemorrhage in the right  hemispheric infarct as noted previously. Following contrast infusion, there is a large rim enhancing fluid collection in the right frontal region along the anterior margin of the craniectomy defect. This measures approximately 5.4 x 9.8 cm and may represent abscess given the clinical presentation. Large hematoma right temporoparietal lobe also shows peripheral enhancement. This can occur with hematoma or infection. 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 Otherwise normal arterial flow voids. Skull and upper cervical spine: Large craniectomy on the right. Right hemisphere herniation of brain into the craniectomy defect. Large subgaleal fluid collection on the right unchanged. Sinuses/Orbits: Negative Other: None IMPRESSION: Right hemispheric infarction. Decompressive craniectomy with herniation of brain into the craniectomy site. Overlying subgaleal fluid collection on the right. Large irregularly enhancing fluid collection along the anterior margin of the craniectomy measuring approximately 5.4 x 9.8 cm. This may represent a large abscess. Large area of hemorrhage in the right temporoparietal infarct also shows peripheral enhancement. This process may be related to sterile hematoma versus infected hematoma. Mild ventricular enlargement, stable These results were called by telephone at the time of interpretation on 08/20/2019 at 11:35 am to provider Kathyrn Sheriff MD , who verbally acknowledged these results. Electronically Signed   By: Franchot Gallo M.D.   On: 08/20/2019 11:36   US Abdomen Limited  Result Date: 08/11/2019 CLINICAL DATA:  RIGHT lower quadrant postop wound swelling. Hematoma? EXAM: ULTRASOUND ABDOMEN LIMITED COMPARISON:  None. FINDINGS: The area swelling in the lower RIGHT abdomen corresponds to the location of patient's skull status post partial craniectomy. There is a mildly complex fluid collection surrounding this portion of patient's skull, the fluid collection  measuring 7.9 x 3 x 7.3 cm. IMPRESSION: Suspected mildly complex seroma versus liquified hematoma in the superficial soft tissues of the RIGHT abdomen, measuring 7.9 cm greatest dimension, surrounding patient's calvarium (location of patient's skull status post partial craniectomy), appearance less suggestive of abscess. The collection seen today by ultrasound is similar to the appearance of the collection on earlier CT abdomen of 08/04/2019. Electronically Signed   By: Franki Cabot M.D.   On: 08/11/2019 13:54   Dg Chest Port 1 View  Result Date: 08/17/2019 CLINICAL DATA:  Fever EXAM: PORTABLE CHEST 1 VIEW COMPARISON:  08/15/2019 FINDINGS: No significant change in AP portable examination. No acute airspace opacity. Right upper extremity PICC, tip projecting over the right atrium. Partially imaged enteric feeding tube. IMPRESSION: No significant change in AP portable examination. No acute airspace opacity. Electronically Signed   By: Eddie Candle M.D.   On: 08/17/2019 09:40   Dg Chest Port 1 View  Result Date: 08/15/2019 CLINICAL DATA:  Endotracheal tube. EXAM: PORTABLE CHEST 1 VIEW COMPARISON:  Radiographs of August 14, 2019. FINDINGS: The heart size and mediastinal contours are within normal limits. Both lungs are clear. Feeding tube and right-sided PICC line are unchanged in position. No pneumothorax or pleural effusion is noted. The visualized skeletal structures are unremarkable. IMPRESSION: Stable support apparatus. No acute cardiopulmonary abnormality seen. Electronically Signed   By: Marijo Conception M.D.   On: 08/15/2019 07:33   Dg Chest Port 1 View  Result Date: 08/14/2019 CLINICAL DATA:  Rule out aspiration. EXAM: PORTABLE CHEST 1 VIEW COMPARISON:  Chest x-rays dated 08/04/2019 and 08/01/2019. FINDINGS: Heart size and mediastinal contours are stable. Lungs are clear. No pleural effusion or pneumothorax seen. Enteric tube passes below the diaphragm. RIGHT-sided PICC line appears stable in  position with tip at the level of the RIGHT atrium. IMPRESSION: 1. Lungs are clear.  No evidence of pneumonia or aspiration 2. Stable position of the support apparatus. Electronically Signed   By: Franki Cabot M.D.   On: 08/14/2019 15:34   Dg Swallowing Func-speech Pathology  Result Date: 08/25/2019 Objective Swallowing Evaluation: Type of Study: MBS-Modified Barium Swallow Study  Patient Details Name: EDREI LOEBER MRN: [id number] Date of Birth: 1976-03-13 Today's Date: 08/25/2019 Time: SLP  Start Time (ACUTE ONLY): 1135 -SLP Stop Time (ACUTE ONLY): 1156 SLP Time Calculation (min) (ACUTE ONLY): 21 min Past Medical History: Past Medical History: Diagnosis Date . 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 CRANIOTOMY Right 07/19/2019  Procedure: RIGHT HEMI-CRANIECTOMY With implantation of skull flap to abdominal wall;  Surgeon: Consuella Lose, MD;  Location: [place];  Service: Neurosurgery;  Laterality: Right; . CYST EXCISION N/A 10/08/2016  Procedure: EXCISION OF POSTERIOR NECK CYST;  Surgeon: Clovis Riley, MD;  Location: [facility];  Service: General;  Laterality: N/A; . INCISION AND DRAINAGE ABSCESS N/A 09/22/2014  Procedure: INCISION AND DRAINAGE ABSCESS POSTERIOR NECK;  Surgeon: Pedro Earls, MD;  Location: [facility];  Service: General;  Laterality: N/A; . INCISION AND DRAINAGE ABSCESS N/A 12/20/2015  Procedure: INCISION AND DRAINAGE POSTERIOR NECK MASS;  Surgeon: Armandina Gemma, MD;  Location: [facility];  Service: General;  Laterality: N/A; . INCISION AND DRAINAGE ABSCESS Left 07/10/2004  middle finger . IR IVC FILTER PLMT / S&I /IMG GUID/MOD SED  07/21/2019 . IR VENOGRAM RENAL UNI RIGHT  07/21/2019 . MASS EXCISION N/A 07/21/2017  Procedure: EXCISION OF BENIGN NECK LESION WITH LAYERED CLOSURE;  Surgeon: Irene Limbo, MD;  Location: [place];  Service: Plastics;  Laterality: N/A; . MASS EXCISION N/A 11/10/2017  Procedure: EXCISION BENIGN  LESION OF THE NECK WITH LAYERED CLOSURE;  Surgeon: Irene Limbo, MD;  Location: [place];  Service: Plastics;  Laterality: N/A; HPI: Pt is a 44 y.o. with no known PMH who presents on 8/25 after being found down at work, nonverbal with L hemiparesis. 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 Stable trace midline shift. S/p hemicraniectomy with abdominal flap implant. Intubated 8/26-8/31. CXR worsened atelectasis in the right lower lobe. MBS completed 08/09/2019 with recommendation for Dys1/thin liquids. Pt demonstrated increased lethargy and vomiting. Hemorrhagic conversion on CT, transferred back to ICU. Head CT 08/14/19 with increase in hemorrhage, edema, mass effect and compression of ventricle  Subjective: Pt seen in radiology for MBS Assessment / Plan / Recommendation CHL IP CLINICAL IMPRESSIONS 08/25/2019 Clinical Impression Pt presented alert with R lean with neck flexion. He exhibited oropharyngeal dysphagia characterized by reduced labial seal resulting in anterior spillage, penetration and silent aspiration with thins, and generalized weakness when masticating solids. Trials of nectar thick via spoon and straw were WNL including consecutive sips. Puree WNL, timely swallow with no oral or pharyngeal residue. Thin trials via straw resulted in 2 instances of penetration to vocal folds and one instance of silent aspiration. Pt unable to elicit cough on command. Did not attempt compensatory strategies given limited neck ROM. Mechanical soft solids revealed oral weakness with prolonged mastication (1-2 mins for small bite) and moderate lingual residue following initial delayed swallow. Pt stimuable for second swallow with dry spoon and cleared remaining residue. Esophageal backflow into cervical esophagus following final trial of nectar via straw. Esophageal scan revealed possible reduced peristalsis. Recommend Dys 1 with Nectar thick liquids, meds  crushed in puree. Full staff supervision with assist for self-feeding necessary to ensure pt is taking appropriately sized sips/bites with clearance of pocketing. SLP will continue to follow for diet toleration. SLP Visit Diagnosis Dysphagia, oropharyngeal phase (R13.12) Attention and concentration deficit following -- Frontal lobe and executive function deficit following -- Impact on safety and function Moderate aspiration risk   CHL IP TREATMENT RECOMMENDATION 08/25/2019 Treatment Recommendations Therapy as outlined in treatment plan below  Prognosis 08/25/2019 Prognosis for Safe Diet Advancement Good Barriers to Reach Goals Severity of deficits Barriers/Prognosis Comment -- CHL IP DIET RECOMMENDATION 08/25/2019 SLP Diet Recommendations Dysphagia 1 (Puree) solids;Nectar thick liquid Liquid Administration via Straw;Cup Medication Administration Crushed with puree Compensations Minimize environmental distractions;Slow rate;Small sips/bites;Lingual sweep for clearance of pocketing Postural Changes Seated upright at 90 degrees   CHL IP OTHER RECOMMENDATIONS 08/25/2019 Recommended Consults -- Oral Care Recommendations Oral care BID;Staff/trained caregiver to provide oral care Other Recommendations Order thickener from pharmacy   CHL IP FOLLOW UP RECOMMENDATIONS 08/25/2019 Follow up Recommendations Skilled Nursing facility   [facility] IP FREQUENCY AND DURATION 08/25/2019 Speech Therapy Frequency (ACUTE ONLY) min 2x/week Treatment Duration 2 weeks      CHL IP ORAL PHASE 08/25/2019 Oral Phase Impaired Oral - Pudding Teaspoon -- Oral - Pudding Cup -- Oral - Honey Teaspoon -- Oral - Honey Cup -- Oral - Nectar Teaspoon Other (Comment) Oral - Nectar Cup -- Oral - Nectar Straw Other (Comment) Oral - Thin Teaspoon -- Oral - Thin Cup -- Oral - Thin Straw WFL Oral - Puree Other (Comment) Oral - Mech Soft Weak lingual manipulation;Lingual/palatal residue;Delayed oral transit Oral - Regular -- Oral - Multi-Consistency -- Oral - Pill NT Oral  Phase - Comment --  CHL IP PHARYNGEAL PHASE 08/25/2019 Pharyngeal Phase Impaired Pharyngeal- Pudding Teaspoon -- Pharyngeal -- Pharyngeal- Pudding Cup -- Pharyngeal -- Pharyngeal- Honey Teaspoon -- Pharyngeal -- Pharyngeal- Honey Cup -- Pharyngeal -- Pharyngeal- Nectar Teaspoon Delayed swallow initiation-vallecula Pharyngeal -- Pharyngeal- Nectar Cup -- Pharyngeal -- Pharyngeal- Nectar Straw Delayed swallow initiation-vallecula Pharyngeal -- Pharyngeal- Thin Teaspoon -- Pharyngeal -- Pharyngeal- Thin Cup Penetration/Aspiration during swallow Pharyngeal Material enters airway, passes BELOW cords without attempt by patient to eject out (silent aspiration) Pharyngeal- Thin Straw Penetration/Aspiration during swallow Pharyngeal Material enters airway, CONTACTS cords and then ejected out;Material enters airway, passes BELOW cords without attempt by patient to eject out (silent aspiration) Pharyngeal- Puree WFL Pharyngeal -- Pharyngeal- Mechanical Soft Delayed swallow initiation-vallecula Pharyngeal -- Pharyngeal- Regular -- Pharyngeal -- Pharyngeal- Multi-consistency -- Pharyngeal -- Pharyngeal- Pill NT Pharyngeal -- Pharyngeal Comment --  CHL IP CERVICAL ESOPHAGEAL PHASE 08/25/2019 Cervical Esophageal Phase Impaired Pudding Teaspoon -- Pudding Cup -- Honey Teaspoon -- Honey Cup -- Nectar Teaspoon -- Nectar Cup -- Nectar Straw Esophageal backflow into cervical esophagus Thin Teaspoon -- Thin Cup -- Thin Straw -- Puree -- Mechanical Soft -- Regular -- Multi-consistency -- Pill -- Cervical Esophageal Comment -- [place] Siren 08/25/2019, 4:23 PM              Dg Swallowing Func-speech Pathology  Result Date: 08/09/2019 Objective Swallowing Evaluation: Type of Study: MBS-Modified Barium Swallow Study  Patient Details Name: DORRELL ROTHSTEIN MRN: [id number] Date of Birth: [birth year] Today's Date: 08/09/2019 Time: SLP Start Time (ACUTE ONLY): 1400 -SLP Stop Time (ACUTE ONLY): 1420 SLP Time Calculation (min) (ACUTE ONLY): 20  min Past Medical History: Past Medical History: Diagnosis Date . 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 CRANIOTOMY Right 07/19/2019  Procedure: RIGHT HEMI-CRANIECTOMY With implantation of skull flap to abdominal wall;  Surgeon: Consuella Lose, MD;  Location: [place];  Service: Neurosurgery;  Laterality: Right; . CYST EXCISION N/A 10/08/2016  Procedure: EXCISION OF POSTERIOR NECK CYST;  Surgeon: Clovis Riley, MD;  Location: [facility];  Service: General;  Laterality: N/A; . INCISION AND DRAINAGE ABSCESS N/A 09/22/2014  Procedure: INCISION AND DRAINAGE ABSCESS POSTERIOR NECK;  Surgeon: Pedro Earls, MD;  Location:  [facility];  Service: General;  Laterality: N/A; . INCISION AND DRAINAGE ABSCESS N/A 12/20/2015  Procedure: INCISION AND DRAINAGE POSTERIOR NECK MASS;  Surgeon: Armandina Gemma, MD;  Location[facility];  Service: General;  Laterality: N/A; . INCISION AND DRAINAGE ABSCESS Left 07/10/2004  middle finger . IR IVC FILTER PLMT / S&I /IMG GUID/MOD SED  07/21/2019 . IR VENOGRAM RENAL UNI RIGHT  07/21/2019 . MASS EXCISION N/A 07/21/2017  Procedure: EXCISION OF BENIGN NECK LESION WITH LAYERED CLOSURE;  Surgeon: Irene Limbo, MD;  Location: [place];  Service: Plastics;  Laterality: N/A; . MASS EXCISION N/A 11/10/2017  Procedure: EXCISION BENIGN LESION OF THE NECK WITH LAYERED CLOSURE;  Surgeon: Irene Limbo, MD;  Location: [place];  Service: Plastics;  Laterality: N/A; HPI: Pt is a 43 y.o. with no known PMH who presents on 8/25 after being found down at work, nonverbal with L hemiparesis. 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 Stable trace midline shift. S/p hemicraniectomy with abdominal flap implant. Intubated 8/26-8/31.  Subjective: Pt seen in radiology for MBS Assessment / Plan / Recommendation CHL IP CLINICAL IMPRESSIONS 08/09/2019 Clinical Impression Pt presents with  surprisingly safe swallow function, given severity of deficits. Pt's primary deficit was oral prep of solid texture, which he was unable to demonstrate. However, pt did chew up the barium tablet and swallow it. Pt tolerated trials of thin liquid, nectar thick liquid, and puree consistencies with adequate oral prep and propulsion. Swallow reflex triggered at the vallecula on nectar thick liquids and puree, and at the vallecula or pyriform sinus on thin liquids. Pt exhibited very trace flash penetration x1 on large consecutive boluses of thin liquid via straw, despite being challenged on multiple occasions. No post-swallow residue was noted in the vallecula or pyriform sinuses. Recommend beginning puree diet with thin liquids, crushed meds. SLP will follow acutely to assess diet tolerance and provide education, as well as to provide trials of advanced textures (solids). Safe swallow precautions were sent with pt back to his room. Results and recommendations discussed with RN. SLP Visit Diagnosis Dysphagia, oropharyngeal phase (R13.12) Impact on safety and function Mild aspiration risk   CHL IP TREATMENT RECOMMENDATION 08/09/2019 Treatment Recommendations Therapy as outlined in treatment plan below   Prognosis 08/09/2019 Prognosis for Safe Diet Advancement Good Barriers to Reach Goals Severity of deficits Barriers/Prognosis Comment -- CHL IP DIET RECOMMENDATION 08/09/2019 SLP Diet Recommendations Dysphagia 1 (Puree) solids;Thin liquid Liquid Administration via Straw Medication Administration Crushed with puree Compensations Minimize environmental distractions;Slow rate;Small sips/bites Postural Changes Seated upright at 90 degrees   CHL IP OTHER RECOMMENDATIONS 08/09/2019   Oral Care Recommendations Oral care QID     CHL IP FOLLOW UP RECOMMENDATIONS 08/09/2019 Follow up Recommendations 24 hour supervision/assistance;Inpatient Rehab   CHL IP FREQUENCY AND DURATION 08/09/2019 Speech Therapy Frequency (ACUTE ONLY) min 2x/week  Treatment Duration 2 weeks      CHL IP ORAL PHASE 08/09/2019 Oral Phase Impaired Oral - Nectar Straw Premature spillage Oral - Thin Straw Premature spillage Oral - Puree Premature spillage Oral - Mech Soft Weak lingual manipulation;Impaired mastication;Holding of bolus Oral - Pill Holding of bolus    CHL IP PHARYNGEAL PHASE 08/09/2019 Pharyngeal Phase Impaired Pharyngeal- Nectar Straw Delayed swallow initiation-vallecula Pharyngeal- Thin Straw Delayed swallow initiation-pyriform sinuses;Delayed swallow initiation-vallecula;Penetration/Aspiration during swallow Pharyngeal Material does not enter airway;Material enters airway, remains ABOVE vocal cords then ejected out Pharyngeal- Puree Delayed swallow initiation-vallecula   Pharyngeal- Mechanical Soft Solid texture  was removed from pt oral cavity and was not swallowed. Pharyngeal- Pill Pt chewed barium tablet and swallowed it with sips of liquid    CHL IP CERVICAL ESOPHAGEAL PHASE 08/09/2019 Cervical Esophageal Phase [facility] Celia B. Quentin Ore, [facility], [place] Speech Language Pathologist [phone] Shonna Chock 08/09/2019, 3:12 PM               (Echo, Carotid, EGD, Colonoscopy, ERCP)    Subjective: Awake alert eating breakfast being fed by the staff  Discharge Exam: Vitals:   09/06/19 0400 09/06/19 0757  BP: (!) 129/91 (!) 131/92  Pulse: 90 84  Resp: 18   Temp:  98.7 F (37.1 C)  SpO2: 100% 98%   Vitals:   09/06/19 0332 09/06/19 0400 09/06/19 0447 09/06/19 0757  BP: 129/89 (!) 129/91  (!) 131/92  Pulse: 89 90  84  Resp: 19 18    Temp: 99.1 F (37.3 C)   98.7 F (37.1 C)  TempSrc: Axillary   Oral  SpO2: 100% 100%  98%  Weight:   92.4 kg   Height:        General: Pt is alert, awake, not in acute distress Cardiovascular: RRR, S1/S2 +, no rubs, no gallops Respiratory: CTA bilaterally, no wheezing, no rhonchi Abdominal: Soft, NT, ND, bowel sounds + Extremities: no edema, no cyanosis    The results of significant diagnostics from this  hospitalization (including imaging, microbiology, ancillary and laboratory) are listed below for reference.     Microbiology: No results found for this or any previous visit (from the past 240 hour(s)).   Labs: BNP (last 3 results) No results for input(s): BNP in the last 8760 hours. Basic Metabolic Panel: Recent Labs  Lab 08/31/19 0500 09/02/19 0529 09/02/19 0812 09/04/19 0555 09/05/19 0500  NA 137 139  --  137 137  K 3.5 3.2*  --  3.2* 3.6  CL 102 105  --  103 102  CO2 25 23  --  24 26  GLUCOSE 133* 104*  --  106* 100*  BUN 12 11  --  6 6  CREATININE 0.56* 0.61  --  0.60* 0.58*  CALCIUM 8.8* 8.6*  --  8.9 9.0  MG  --   --  1.9 1.6*  --    Liver Function Tests: Recent Labs  Lab 09/02/19 0529 09/04/19 0555  AST 34 26  ALT 75* 64*  ALKPHOS 111 116  BILITOT 0.9 0.7  PROT 6.2* 6.3*  ALBUMIN 2.7* 2.7*   No results for input(s): LIPASE, AMYLASE in the last 168 hours. No results for input(s): AMMONIA in the last 168 hours. CBC: Recent Labs  Lab 08/31/19 0500 09/02/19 0529 09/05/19 0500  WBC 6.1 6.2 5.5  HGB 11.8* 11.2* 11.6*  HCT 35.4* 33.5* 34.9*  MCV 86.3 86.3 84.3  PLT 285 259 242   Cardiac Enzymes: No results for input(s): CKTOTAL, CKMB, CKMBINDEX, TROPONINI in the last 168 hours. BNP: Invalid input(s): POCBNP CBG: Recent Labs  Lab 09/05/19 1650 09/05/19 1951 09/05/19 2336 09/06/19 0331 09/06/19 0755  GLUCAP 107* 117* 79 115* 87   D-Dimer No results for input(s): DDIMER in the last 72 hours. Hgb A1c No results for input(s): HGBA1C in the last 72 hours. Lipid Profile No results for input(s): CHOL, HDL, LDLCALC, TRIG, CHOLHDL, LDLDIRECT in the last 72 hours. Thyroid function studies No results for input(s): TSH, T4TOTAL, T3FREE, THYROIDAB in the last 72 hours.  Invalid input(s): FREET3 Anemia work up No results for input(s): VITAMINB12, FOLATE, FERRITIN, TIBC,  IRON, RETICCTPCT in the last 72 hours. Urinalysis    Component Value Date/Time    COLORURINE YELLOW 08/17/2019 1242   APPEARANCEUR CLOUDY (A) 08/17/2019 1242   LABSPEC 1.017 08/17/2019 1242   PHURINE 7.0 08/17/2019 1242   GLUCOSEU NEGATIVE 08/17/2019 1242   HGBUR MODERATE (A) 08/17/2019 1242   BILIRUBINUR NEGATIVE 08/17/2019 1242   KETONESUR NEGATIVE 08/17/2019 1242   PROTEINUR NEGATIVE 08/17/2019 1242   NITRITE NEGATIVE 08/17/2019 1242   LEUKOCYTESUR NEGATIVE 08/17/2019 1242   Sepsis Labs Invalid input(s): PROCALCITONIN,  WBC,  LACTICIDVEN Microbiology No results found for this or any previous visit (from the past 240 hour(s)).   Time coordinating discharge:  37 minutes  SIGNED:   Georgette Shell, MD  [facility] 09/06/2019, 10:55 AM Pager   If 7PM-7AM, please contact night-coverage [email or web address] Password [email or web address]

## 2019-09-06 NOTE — Progress Notes (Signed)
Pharmacy Antibiotic Note  Matthew Black is a 43 y.o. 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  Pharmacy has been consulted for Vancomycin dosing. Given the patient's abscess is in the epidural space and there is mention of cerebritis we will target a trough of 15-20.   Vancomycin trough today up to 19 on 1500 mg IV every 8 hours and has been steadily increasing. Will reduce dose to prevent accumulation but remain at goal and continue to monitor.   Plan: Reduce Vancomycin to 1250 mg IV q8h.  Continue Cefepime 2 gms IV q8hr Monitor renal function, clinical status, weekly vanc levels  Height: 5\' 11"  (180.3 cm) Weight: 203 lb 11.3 oz (92.4 kg) IBW/kg (Calculated) : 75.3  Temp (24hrs), Avg:99 F (37.2 C), Min:98.4 F (36.9 C), Max:99.5 F (37.5 C)  Recent Labs  Lab 08/31/19 0500  09/02/19 0529 09/02/19 1330 09/04/19 0555 09/05/19 0500 09/06/19 1329  WBC 6.1  --  6.2  --   --  5.5  --   CREATININE 0.56*  --  0.61  --  0.60* 0.58*  --   VANCOTROUGH  --    < >  --  15  --   --  19   < > = values in this interval not displayed.    Estimated Creatinine Clearance: 141.1 mL/min (A) (by C-G formula based on SCr of 0.58 mg/dL (L)).    Allergies  Allergen Reactions  . Hydrocodone Nausea Only    Antimicrobials this admission: Zosyn 9/21>>9/26 Vanc 9/26 >> (11/20) Cefepime 9/26 >> (11/20)  Thank you for allowing pharmacy to be a part of this patient's care.  Sloan Leiter, PharmD, BCPS, BCCCP Clinical Pharmacist Clinical phone 09/06/2019 until 3P [phone] Please refer to [facility] for [place] numbers 09/06/2019 2:16 PM

## 2019-09-06 NOTE — PMR Pre-admission (Addendum)
PMR Admission Coordinator Pre-Admission Assessment  Patient: Matthew Black is an 43 y.o., male MRN: [id number] DOB: 1975/12/20 Height: 5\' 11"  (180.3 cm) Weight: 92.4 kg              Insurance Information HMO:     PPO:      PCP:      IPA:      80/20:     OTHER:  PRIMARY: Medicaid [place]      Policy#: [id number]      Subscriber: pt Benefits:  Phone #: passport one online     Name: 09/05/2019 Eff. Date: active MAFCN; new this admission     Deduct:       Out of Pocket Max:       Life Max:  CIR: per Medicaid guidelines       Wife will talk about applying for other insurance. She is talking about marketplace insurance with open enrollment 09/25/2019. They had applied last April but did not get for undisclosed reasons. I have explained to her that if he was eligible, it would not be effective until 11/2019.  Financial counselor, Matthew Black , has been working with wife and pt initially was uninsured. He now has Medicaid for families. 08/05/2019 patient was referred to [facility] for disability. I spoke with wife 10/12 and she states she is reviewing the disability paperwork. I have encouraged her to complete the disability application to assist with obtaining him monthly funding as well as full Medicaid benefits if eligible. I did contact Matthew Black, financial counselor to request she follow up with wife on the disability.  Medicaid Application Date:       Case Manager:  Disability Application Date:       Case Worker:  Working with Development worker, community on full medicaid and disability application since [id number] per FC.   The "Data Collection Information Summary" for patients in Inpatient Rehabilitation Facilities with attached "[facility] Records" was provided and verbally reviewed with: N/A  Emergency Contact Information Contact Information    Name Relation Home Work Mobile   Matthew Black Spouse [phone]  [phone]   Matthew Black Mother [phone]   [phone]     Current Medical History  Patient Admitting Diagnosis: right MCA/ACA infarction  History of Present Illness: 43 year old right-handed male with unremarkable past medical history on no prescription medications.  He quit smoking 3 years ago.   Presented 07/19/2019 after being found down.  He had dried vomit around his mouth and was covered in urine.  Nonverbal not moving his left side.  CT of the head showed a large right hemisphere infarction with confluence cytotoxic edema in the right ACA and MCA territories.  Evidence of large vessel occlusion.  CT cervical spine negative.  MRI/MRA showed right ICA occlusion.  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  Alcohol negative, urine drug screen positive marijuana, creatinine 1.43, urine culture no growth, COVID negative.  Patient underwent decompressive right hemicraniectomy with abdominal flap implant 07/19/2019 per Dr. Kathyrn Sheriff.  Echocardiogram with ejection fraction of 65%.  Bubble study negative for PFO.  EEG with severe diffuse encephalopathy no seizure noted.  Hospital course further complicated by lower extremity Doppler showing a DVT right peroneal, left popliteal, left posterior tibial and left peroneal.  He underwent IVC filter placement 07/21/2019 with plan for retrieval in 8 to 12 weeks.  He was not a candidate for long-term anticoagulation due to craniotomy but patient is currently maintained on subcutaneous heparin 5000 units  every 8 hours.  Patient n.p.o. with Cortrak feeding tube for nutritional support and diet has been advanced to dysphagia #2 nectar liquid and nasogastric tube feeds discontinued.  Keppra for seizure prophylaxis.  He remained intubated 07/20/2019 through 07/25/2019.  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  He underwent aspiration on 08/21/2019 per Dr. Kathyrn Sheriff and cultures were sent with no growth to date.  He was not a candidate for surgery due to increasing herniation.  ID consulted placed  on IV vancomycin and cefepime 08/20/2019 x6 to 8 weeks.  Patient also developed Pseudomonas UTI completing a course of Zosyn.  Palliative care consulted 08/22/2019 to establish goals of care.  Therapy evaluations completed and patient was admitted for a comprehensive rehab program.  Complete NIHSS TOTAL: 22 Glasgow Coma Scale Score: 10  Past Medical History  Past Medical History:  Diagnosis Date  . 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  Prior Rehab/Hospitalizations:  Has the patient had prior rehab or hospitalizations prior to admission? Yes  Has the patient had major surgery during 100 days prior to admission? Yes  Current Medications   Current Facility-Administered Medications:  .  0.9 %  sodium chloride infusion, , Intravenous, Continuous, Biby, Sharon L, NP, Last Rate: 40 mL/hr at 09/05/19 0531 .  0.9 %  sodium chloride infusion, , Intravenous, PRN, Donzetta Starch, NP, Stopped at 08/21/19 2124 .  0.9 %  sodium chloride infusion, , Intravenous, PRN, Donzetta Starch, NP, Stopped at 08/23/19 0945 .  acetaminophen (TYLENOL) tablet 650 mg, 650 mg, Oral, Q4H, 650 mg at 09/06/19 0445 **OR** acetaminophen (TYLENOL) solution 650 mg, 650 mg, Per Tube, Q4H, 650 mg at 09/06/19 0753 **OR** acetaminophen (TYLENOL) suppository 650 mg, 650 mg, Rectal, Q4H, Biby, Sharon L, NP .  ceFEPIme (MAXIPIME) 2 g in sodium chloride 0.9 % 100 mL IVPB, 2 g, Intravenous, Q8H, Sinclair, Emily S, RPH, Last Rate: 200 mL/hr at 09/06/19 1000, 2 g at 09/06/19 1000 .  chlorhexidine (PERIDEX) 0.12 % solution 15 mL, 15 mL, Mouth Rinse, BID,  Biby, Sharon L, NP, 15 mL at 09/06/19 1008 .  Chlorhexidine Gluconate Cloth 2 % PADS 6 each, 6 each, Topical, Daily, Donzetta Starch, NP, 6 each at 09/06/19 0753 .  famotidine (PEPCID) tablet 20 mg, 20 mg, Oral, BID, Georgette Shell, MD, 20 mg at 09/06/19 1007 .  feeding supplement (ENSURE ENLIVE) (ENSURE ENLIVE) liquid 237 mL, 237 mL, Oral, TID BM, Georgette Shell, MD, 237 mL at 09/05/19 1950 .  heparin injection 5,000 Units, 5,000 Units, Subcutaneous, Q8H, Donzetta Starch, NP, 5,000 Units at 09/06/19 0446 .  labetalol (NORMODYNE) injection 5-20 mg, 5-20 mg, Intravenous, Q10 min PRN, Biby, Sharon L, NP .  levETIRAcetam (KEPPRA) tablet 500 mg, 500 mg, Oral, BID, Georgette Shell, MD, 500 mg at 09/06/19 1007 .  MEDLINE mouth rinse, 15 mL, Mouth Rinse, q12n4p, Biby, Sharon L, NP, 15 mL at 09/05/19 1300 .  metoprolol tartrate (LOPRESSOR) tablet 50 mg, 50 mg, Oral, Q8H, Georgette Shell, MD, 50 mg at 09/06/19 0445 .  ondansetron (ZOFRAN) injection 4 mg, 4 mg, Intravenous, Q6H PRN, Donzetta Starch, NP .  Resource ThickenUp Clear, , Oral, PRN, Eugenie Filler, MD .  senna-docusate (Senokot-S) tablet 1 tablet, 1 tablet, Per Tube, QHS PRN, Donzetta Starch, NP, 1 tablet at 08/17/19 1417 .  sodium chloride flush (NS) 0.9 % injection 10-40 mL, 10-40 mL, Intracatheter, Q12H, Biby, Sharon L, NP, 10 mL at 09/06/19 1008 .  sodium chloride flush (NS) 0.9 % injection 10-40 mL, 10-40 mL, Intracatheter, PRN, Biby, Sharon L, NP, 10 mL at 09/03/19 1326 .  traMADol (ULTRAM) tablet 50 mg, 50 mg, Per Tube, Q6H PRN, Donzetta Starch, NP, 50 mg at 09/04/19 2030 .  vancomycin (VANCOCIN) 1,500 mg in sodium chloride 0.9 % 500 mL IVPB, 1,500 mg, Intravenous, Q8H, Rumbarger, Valeda Malm, RPH, Last Rate: 250 mL/hr at 09/06/19 0447, 1,500 mg at 09/06/19 0447  Patients Current Diet:  Diet Order            DIET DYS 2 Room service appropriate? No; Fluid consistency: Nectar Thick  Diet effective now               Precautions / Restrictions Precautions Precautions: Fall Precaution Comments: L hemiparesis, R skull missing, abd bone flap; mitt for R hand to keep from touching his head Other Brace: helmet (not wearing due to presses on edema on R side of head) Restrictions Weight Bearing Restrictions: No   Has the patient had 2 or more falls or a fall with injury in the past year?No  Prior Activity Level Community (5-7x/wk): Independent, driving, self employed carpet cleaning buisiness  Prior Functional Level Prior Function Level of Independence: Independent Comments: working  Self Care: Did the patient need help bathing, dressing, using the toilet or eating?  Independent  Indoor Mobility: Did the patient need assistance with walking from room to room (with or without device)? Independent  Stairs: Did the patient need assistance with internal or external stairs (with or without device)? Independent  Functional Cognition: Did the patient need help planning regular tasks such as shopping or remembering to take medications? Independent  Home Assistive Devices / Equipment Home Assistive Devices/Equipment: None  Prior Device Use: Indicate devices/aids used by the patient prior to current illness, exacerbation or injury? None of the above  Current Functional Level Cognition  Arousal/Alertness: Lethargic Overall Cognitive Status: Impaired/Different from baseline Difficult to assess due to: Impaired communication Current Attention Level: Sustained Orientation Level: Other (comment)(UTA pt no speaking to RN ) Following Commands: Follows one step commands consistently, Follows one step commands with increased time Safety/Judgement: Decreased awareness of deficits(L sided neglect) General Comments: pt following all commands on the R, unable ont he L, con't to have L sided neglect and inability to self correct, remains non-verbal Attention: Focused Focused Attention: Impaired Focused  Attention Impairment: Functional basic, Verbal basic Memory: (Unable to assess due to lethargy. TBA) Awareness: Impaired Awareness Impairment: Emergent impairment Problem Solving: (to be assessed further ) Safety/Judgment: Impaired [employer] Scales of Cognitive Functioning: (not a brain injury)    Extremity Assessment (includes Sensation/Coordination)  Upper Extremity Assessment: RUE deficits/detail, LUE deficits/detail RUE Deficits / Details: Pt reaching forward towards his knee; weakness with gross motor. Able to make a "thumbs up"  RUE Coordination: decreased fine motor, decreased gross motor LUE Deficits / Details: No active movement of LUE LUE Coordination: decreased fine motor, decreased gross motor  Lower Extremity Assessment: Defer to PT evaluation RLE Deficits / Details: Moving spontaneously LLE Deficits / Details: Autonomic movement noted with coughing, otherwise 0/5    ADLs  Overall ADL's :  Needs assistance/impaired Grooming: Wash/dry face, Sitting, Moderate assistance Grooming Details (indicate cue type and reason): tactile cues and hand over hand assist to initiate task with RUE Toileting- Clothing Manipulation and Hygiene: Total assistance, +2 for physical assistance, Bed level Toileting - Clothing Manipulation Details (indicate cue type and reason): total A for toilet hygiene at bed level atfer bowel incontience.  Functional mobility during ADLs: Total assistance, +2 for physical assistance, +2 for safety/equipment(Maxi move to chair) General ADL Comments: session focus on functional mobility and seated ADLS in TIS to attempt to increase level of arousal    Mobility  Overal bed mobility: Needs Assistance Bed Mobility: Rolling, Sidelying to Sit, Sit to Sidelying Rolling: Mod assist, Max assist Sidelying to sit: Max assist, +2 for physical assistance Supine to sit: Total assist, HOB elevated, +2 for physical assistance Sit to supine: Max assist, +2 for physical  assistance General bed mobility comments: pt initiates with R UE but erquires mod/maxA to complete task. maxA to push up into sitting EOB    Transfers  Overall transfer level: Needs assistance Equipment used: (stedy) Transfer via Lift Equipment: Maximove Transfers: Sit to/from Stand Sit to Stand: Max assist, +2 physical assistance General transfer comment: used stedy x3 trials, completed sit to stand with gait belt and bed pad below bottom, pt with L knee blocked, able to achieve full upright position but unable to maintain. pt contintues with strong lean to the L requiring max assist to maintain midline    Ambulation / Gait / Stairs / Wheelchair Mobility  Ambulation/Gait General Gait Details: unable    Posture / Balance Dynamic Sitting Balance Sitting balance - Comments: strong left lateral lean, unable to maintain head in extension without max assist Balance Overall balance assessment: Needs assistance Sitting-balance support: Feet supported Sitting balance-Leahy Scale: Poor Sitting balance - Comments: strong left lateral lean, unable to maintain head in extension without max assist Postural control: Left lateral lean Standing balance support: Single extremity supported    Special needs/care consideration BiPAP/CPAP n/a CPM n/a Continuous Drip IV PICC RUE placed 07/20/2019; Vanc and cefepime IV started 9/26 for 6 to 8 weeks LOT Dialysis n/a Life Vest n/a Oxygen n/a Special Bed seizure precautions Trach Size n/a Wound Vac n/a Skin Right head surgical incision with attached edges; surgical wound with bone flap to abdomen, abrasion to left of face; blister to right of neck; ecchymosis to right knee, skin tear to left buttocks Bowel mgmt: incontinent LBM 10/12 Bladder mgmt:external catheter Diabetic mgmt n/a Behavioral consideration  N/a Chemo/radiation  N/a Designated visitor is wife, Melene Muller    Previous Home Environment  Living Arrangements: Spouse/significant other,  Children(also 24 and 67 year old children in the home)  Lives With: Spouse, Family(two children) Available Help at Discharge: Family, Available 24 hours/day(pt's Mom and stepdad as well as wife's parents to assist) Type of Home: House Home Layout: Two level, 1/2 bath on main level, Bed/bath upstairs(no bed or full bath downstairs) Alternate Level Stairs-Number of Steps: flight Home Access: Level entry Bathroom Shower/Tub: Walk-in shower(upstairs) Biochemist, clinical: Standard Bathroom Accessibility: Yes How Accessible: Accessible via walker Home Care Services: No Additional Comments: wife next of kin  Discharge Living Setting Plans for Discharge Living Setting: Patient's home, Lives with (comment)(wife, two children 26 and 28 years old) Type of Home at Discharge: House Discharge Home Layout: Two level, 1/2 bath on main level, Bed/bath upstairs(will set up bedroom downstairs in dining room) Discharge Home Access: Level entry Discharge Bathroom Shower/Tub: Walk-in shower(upstairs; 1/2 bath on  main level) Discharge Bathroom Toilet: Standard Discharge Bathroom Accessibility: Yes How Accessible: Accessible via walker Does the patient have any problems obtaining your medications?: Yes (Describe)(uninsured pta; now had medicaid for families since working w)  Producer, 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 [employer] Patient Roles: Spouse, Parent(self employed) Sport and exercise psychologist Information: wife, Alease Medina Anticipated Caregiver: wife, his Mom and Ronda Fairly as well as wife's parents Anticipated Ambulance person Information: [phone] Ability/Limitations of Caregiver: wife unemployed; other paretns and in laws work but they will rearrange schedules to assist with wife in his care per wife Caregiver Availability: 24/7 Discharge Plan Discussed with Primary Caregiver: Yes Is Caregiver In Agreement with Plan?: Yes Does Caregiver/Family have Issues with Lodging/Transportation while Pt is in Rehab?: No   Goals/Additional  Needs Patient/Family Goal for Rehab: Mod assist PT, OT and SLP at wheelchair level goals Expected length of stay: ELOS 3 to 4 weeks if making progress; otherwise if he plateaus, wife aware that he will d/c home sooner Pt/Family Agrees to Admission and willing to participate: Yes Program Orientation Provided & Reviewed with Pt/Caregiver Including Roles  & Responsibilities: Yes  Wife had one SNF bed offer at [facility] in [place]. Wife refused bed offer due to rating and wish for CIR acceptance.  Decrease burden of Care through IP rehab admission: n/a  Possible need for SNF placement upon discharge: I discussed with wife on 10/9 and 10/12 that CIR admission with goal of d/c home at heavy mod assist wheelchair level. Plan is not for SNF after CIR . She would be giving up her one SNF bed offer locally and that other SNF searches would be extended further.   Patient Condition: This patient's medical and functional status has changed since the consult dated 07/27/2019 in which the Rehabilitation Physician determined and documented that the patient was potentially appropriate for intensive rehabilitative care in an inpatient rehabilitation facility. Issues have been addressed and update has been discussed with Dr. Naaman Plummer and patient now appropriate for inpatient rehabilitation. Will admit to inpatient rehab today.   Preadmission Screen Completed By:  Cleatrice Burke, RN, 09/06/2019 10:16 AM ______________________________________________________________________   Discussed status with Dr. Naaman Plummer on 09/06/2019 at 1124 and received approval for admission today.  Admission Coordinator:  Cleatrice Burke, time [id number] Date 09/06/2019

## 2019-09-06 NOTE — H&P (Signed)
Physical Medicine and Rehabilitation Admission H&P    Chief Complaint  Patient presents with   Altered Mental Status  : HPI: Matthew Black. Matthew Black is a 43 year old right-handed male with unremarkable past medical history on no prescription medications.  He quit smoking 3 years ago.  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  His wife, mother and stepfather can assist.  Presented 07/19/2019 after being found down.  He had dried vomit around his mouth and was covered in urine.  Nonverbal not moving his left side.  CT of the head showed a large right hemisphere infarction with confluence cytotoxic edema in the right ACA and MCA territories.  Evidence of large vessel occlusion.  CT cervical spine negative.  MRI/MRA showed right ICA occlusion.  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  Alcohol negative, urine drug screen positive marijuana, creatinine 1.43, urine culture no growth, COVID negative.  Patient underwent decompressive right hemicraniectomy with abdominal flap implant 07/19/2019 per Dr. Kathyrn Sheriff.  Echocardiogram with ejection fraction of 65%.  Bubble study negative for PFO.  EEG with severe diffuse encephalopathy no seizure noted.  Hospital course further complicated by lower extremity Doppler showing a DVT right peroneal, left popliteal, left posterior tibial and left peroneal.  He underwent IVC filter placement 07/21/2019 with plan for retrieval in 8 to 12 weeks.  He was not a candidate for long-term anticoagulation due to craniotomy but patient is currently maintained on subcutaneous heparin 5000 units every 8 hours.  Patient n.p.o. with Cortrak feeding tube for nutritional support and diet has been advanced to dysphagia #2 nectar liquid and nasogastric tube feeds discontinued.  Keppra for seizure prophylaxis.  He remained intubated 07/20/2019 through 07/25/2019.  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  He underwent aspiration on 08/21/2019 per Dr. Kathyrn Sheriff and cultures were sent with no growth to date.  He was not a candidate for surgery due to increasing herniation.  ID consulted placed  on IV vancomycin and cefepime 08/20/2019 x6 to 8 weeks.  Patient also developed Pseudomonas UTI completing a course of Zosyn.  Palliative care consulted 08/22/2019 to establish goals of care.  Therapy evaluations completed and patient was admitted for a comprehensive rehab program.  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   Past Surgical History:  Procedure Laterality Date   CRANIOTOMY Right 07/19/2019   Procedure: RIGHT HEMI-CRANIECTOMY With implantation of skull flap to abdominal wall;  Surgeon: Consuella Lose, MD;  Location: [facility];  Service: Neurosurgery;  Laterality: Right;   CYST EXCISION N/A 10/08/2016   Procedure: EXCISION OF POSTERIOR NECK CYST;  Surgeon: Clovis Riley, MD;  Location: [facility];  Service: General;  Laterality: N/A;   INCISION AND DRAINAGE ABSCESS N/A 09/22/2014   Procedure: INCISION AND DRAINAGE ABSCESS POSTERIOR NECK;  Surgeon: Pedro Earls, MD;  Location: [facility];  Service: General;  Laterality: N/A;   INCISION AND DRAINAGE ABSCESS N/A 12/20/2015   Procedure: INCISION AND DRAINAGE POSTERIOR NECK MASS;  Surgeon: Armandina Gemma, MD;  Location: [facility];  Service: General;  Laterality: N/A;   INCISION AND DRAINAGE ABSCESS Left  07/10/2004   middle finger   IR IVC FILTER PLMT / S&I /IMG GUID/MOD SED  07/21/2019   IR VENOGRAM RENAL UNI RIGHT  07/21/2019   MASS EXCISION N/A 07/21/2017   Procedure: EXCISION OF BENIGN NECK  LESION WITH LAYERED CLOSURE;  Surgeon: Irene Limbo, MD;  Location: [facility];  Service: Plastics;  Laterality: N/A;   MASS EXCISION N/A 11/10/2017   Procedure: EXCISION BENIGN LESION OF THE NECK WITH LAYERED CLOSURE;  Surgeon: Irene Limbo, MD;  Location: [facility];  Service: Plastics;  Laterality: N/A;   Family History  Problem Relation Age of Onset   Diabetes Other        GF   Healthy Mother    Healthy Father    Social History:  reports that he quit smoking about 3 years ago. He smoked 0.00 packs per day. He has never used smokeless tobacco. He reports current alcohol use. He reports that he does not use drugs. Allergies:  Allergies  Allergen Reactions   Hydrocodone Nausea Only   Medications Prior to Admission  Medication Sig Dispense Refill   ibuprofen (ADVIL,MOTRIN) 800 MG tablet Take 1 tablet (800 mg total) by mouth 3 (three) times daily. (Patient not taking: Reported on 07/19/2019) 21 tablet 0   oseltamivir (TAMIFLU) 75 MG capsule Take 1 capsule (75 mg total) by mouth every 12 (twelve) hours. (Patient not taking: Reported on 07/19/2019) 10 capsule 0    Drug Regimen Review Drug regimen was reviewed and remains appropriate with no significant issues identified  Home: Home Living Family/patient expects to be discharged to:: Unsure Living Arrangements: Spouse/significant other Available Help at Discharge: Family Additional Comments: list wife in chart   Lives With: Spouse, Family(2 kids)   Functional History: Prior Function Level of Independence: Independent Comments: working  Functional Status:  Mobility: Bed Mobility Overal bed mobility: Needs Assistance Bed Mobility: Rolling, Sidelying to Sit, Sit to Sidelying Rolling: Mod assist, Max assist Sidelying to sit: Max assist, +2 for physical assistance Supine to sit: Total assist, HOB elevated, +2 for physical assistance Sit to supine: Max assist, +2 for physical  assistance General bed mobility comments: pt initiates with R UE but erquires mod/maxA to complete task. maxA to push up into sitting EOB Transfers Overall transfer level: Needs assistance Equipment used: (stedy) Transfer via Lift Equipment: Maximove Transfers: Sit to/from Stand Sit to Stand: Max assist, +2 physical assistance General transfer comment: used stedy x3 trials, completed sit to stand with gait belt and bed pad below bottom, pt with L knee blocked, able to achieve full upright position but unable to maintain. pt contintues with strong lean to the L requiring max assist to maintain midline Ambulation/Gait General Gait Details: unable    ADL: ADL Overall ADL's : Needs assistance/impaired Grooming: Wash/dry face, Sitting, Moderate assistance Grooming Details (indicate cue type and reason): tactile cues and hand over hand assist to initiate task with RUE Toileting- Clothing Manipulation and Hygiene: Total assistance, +2 for physical assistance, Bed level Toileting - Clothing Manipulation Details (indicate cue type and reason): total A for toilet hygiene at bed level atfer bowel incontience.  Functional mobility during ADLs: Total assistance, +2 for physical assistance, +2 for safety/equipment(Maxi move to chair) General ADL Comments: session focus on functional mobility and seated ADLS in TIS to attempt to increase level of arousal  Cognition: Cognition Overall Cognitive Status: Impaired/Different from baseline Arousal/Alertness: Lethargic Orientation Level: Other (comment)(UTA pt no speaking to RN ) Attention: Focused Focused Attention: Impaired Focused Attention  Impairment: Functional basic, Verbal basic Memory: (Unable to assess due to lethargy. TBA) Awareness: Impaired Awareness Impairment: Emergent impairment Problem Solving: (to be assessed further ) Safety/Judgment: Impaired [employer] Scales of Cognitive Functioning: (not a brain  injury) Cognition Arousal/Alertness: Awake/alert Behavior During Therapy: Flat affect Overall Cognitive Status: Impaired/Different from baseline Area of Impairment: Attention, Safety/judgement, Following commands Current Attention Level: Sustained Following Commands: Follows one step commands consistently, Follows one step commands with increased time Safety/Judgement: Decreased awareness of deficits(L sided neglect) Awareness: Intellectual Problem Solving: Slow processing, Difficulty sequencing, Requires verbal cues, Requires tactile cues General Comments: pt following all commands on the R, unable ont he L, con't to have L sided neglect and inability to self correct, remains non-verbal Difficult to assess due to: Impaired communication  Physical Exam: Blood pressure (!) 129/91, pulse 90, temperature 99.1 F (37.3 C), temperature source Axillary, resp. rate 18, height 5\' 11"  (1.803 m), weight 92.4 kg, SpO2 100 %. Physical Exam  Constitutional: He appears well-developed and well-nourished. No distress.  HENT:  Mouth/Throat: Oropharynx is clear and moist. No oropharyngeal exudate.  Craniectomy site with small amount of drainage, mild swelling  Eyes: Pupils are equal, round, and reactive to light. Conjunctivae and EOM are normal. Right eye exhibits no discharge. Left eye exhibits no discharge.  Neck: Normal range of motion. No tracheal deviation present. No thyromegaly present.  Cardiovascular: Normal rate and regular rhythm. Exam reveals no friction rub.  No murmur heard. Respiratory: Effort normal and breath sounds normal. No respiratory distress. He has no wheezes. He has no rales.  GI: Soft. He exhibits no distension. There is no abdominal tenderness.  Musculoskeletal: Normal range of motion.     Comments: Left central 7. Does track to all visual fields although has right gaze preference. Non-verbal. Dense left hemiparesis. 0/5 LUE and LLE. At least 3-4/5 RUE and RLE. Senses pain on  right side, minimal withdrawal on left. DTR's 3+ LUE, LLE, toes up.   Neurological: He is alert.  Patient alert and resting comfortably.  Makes eye contact as I entered the room.  He remained nonverbal throughout exam.  He does give thumbs up for responses and can provide yes and no head nods fairly consistently  Skin: Skin is warm and dry.  Psychiatric:  Flat, cooperative    Results for orders placed or performed during the hospital encounter of 07/19/19 (from the past 48 hour(s))  Comprehensive metabolic panel     Status: Abnormal   Collection Time: 09/04/19  5:55 AM  Result Value Ref Range   Sodium 137 135 - 145 mmol/L   Potassium 3.2 (L) 3.5 - 5.1 mmol/L   Chloride 103 98 - 111 mmol/L   CO2 24 22 - 32 mmol/L   Glucose, Bld 106 (H) 70 - 99 mg/dL   BUN 6 6 - 20 mg/dL   Creatinine, Ser 0.60 (L) 0.61 - 1.24 mg/dL   Calcium 8.9 8.9 - 10.3 mg/dL   Total Protein 6.3 (L) 6.5 - 8.1 g/dL   Albumin 2.7 (L) 3.5 - 5.0 g/dL   AST 26 15 - 41 U/L   ALT 64 (H) 0 - 44 U/L   Alkaline Phosphatase 116 38 - 126 U/L   Total Bilirubin 0.7 0.3 - 1.2 mg/dL   GFR calc non Af Amer >60 >60 mL/min   GFR calc Af Amer >60 >60 mL/min   Anion gap 10 5 - 15    Comment: Performed at [facility], [address]  [place], [place] [address]  Magnesium     Status: Abnormal   Collection Time: 09/04/19  5:55 AM  Result Value Ref Range   Magnesium 1.6 (L) 1.7 - 2.4 mg/dL    Comment: Performed at [facility] [address] [place], [place] [address]  Glucose, capillary     Status: None   Collection Time: 09/04/19  8:08 AM  Result Value Ref Range   Glucose-Capillary 85 70 - 99 mg/dL   Comment 1 Notify RN    Comment 2 Document in Chart   Glucose, capillary     Status: Abnormal   Collection Time: 09/04/19 12:21 PM  Result Value Ref Range   Glucose-Capillary 103 (H) 70 - 99 mg/dL  Glucose, capillary     Status: None   Collection Time: 09/04/19  5:13 PM  Result Value Ref Range   Glucose-Capillary  86 70 - 99 mg/dL  Glucose, capillary     Status: Abnormal   Collection Time: 09/04/19  8:24 PM  Result Value Ref Range   Glucose-Capillary 118 (H) 70 - 99 mg/dL   Comment 1 Notify RN    Comment 2 Document in Chart   Glucose, capillary     Status: None   Collection Time: 09/04/19 11:57 PM  Result Value Ref Range   Glucose-Capillary 71 70 - 99 mg/dL   Comment 1 Notify RN    Comment 2 Document in Chart   Glucose, capillary     Status: None   Collection Time: 09/05/19  3:56 AM  Result Value Ref Range   Glucose-Capillary 94 70 - 99 mg/dL   Comment 1 Notify RN    Comment 2 Document in Chart   CBC     Status: Abnormal   Collection Time: 09/05/19  5:00 AM  Result Value Ref Range   WBC 5.5 4.0 - 10.5 K/uL   RBC 4.14 (L) 4.22 - 5.81 MIL/uL   Hemoglobin 11.6 (L) 13.0 - 17.0 g/dL   HCT 34.9 (L) 39.0 - 52.0 %   MCV 84.3 80.0 - 100.0 fL   MCH 28.0 26.0 - 34.0 pg   MCHC 33.2 30.0 - 36.0 g/dL   RDW 14.7 11.5 - 15.5 %   Platelets 242 150 - 400 K/uL   nRBC 0.0 0.0 - 0.2 %    Comment: Performed at [facility], [place] [address], [place], [place] [id number]  Basic metabolic panel     Status: Abnormal   Collection Time: 09/05/19  5:00 AM  Result Value Ref Range   Sodium 137 135 - 145 mmol/L   Potassium 3.6 3.5 - 5.1 mmol/L   Chloride 102 98 - 111 mmol/L   CO2 26 22 - 32 mmol/L   Glucose, Bld 100 (H) 70 - 99 mg/dL   BUN 6 6 - 20 mg/dL   Creatinine, Ser 0.58 (L) 0.61 - 1.24 mg/dL   Calcium 9.0 8.9 - 10.3 mg/dL   GFR calc non Af Amer >60 >60 mL/min   GFR calc Af Amer >60 >60 mL/min   Anion gap 9 5 - 15    Comment: Performed at [facility], [place] [address] [place], [place] [address]  Glucose, capillary     Status: Abnormal   Collection Time: 09/05/19 11:10 AM  Result Value Ref Range   Glucose-Capillary 111 (H) 70 - 99 mg/dL  Glucose, capillary     Status: Abnormal   Collection Time: 09/05/19  4:50 PM  Result Value Ref Range  Glucose-Capillary 107 (H) 70 - 99 mg/dL  Glucose,  capillary     Status: Abnormal   Collection Time: 09/05/19  7:51 PM  Result Value Ref Range   Glucose-Capillary 117 (H) 70 - 99 mg/dL   Comment 1 Notify RN    Comment 2 Document in Chart   Glucose, capillary     Status: None   Collection Time: 09/05/19 11:36 PM  Result Value Ref Range   Glucose-Capillary 79 70 - 99 mg/dL   Comment 1 Notify RN    Comment 2 Document in Chart   Glucose, capillary     Status: Abnormal   Collection Time: 09/06/19  3:31 AM  Result Value Ref Range   Glucose-Capillary 115 (H) 70 - 99 mg/dL   Comment 1 Notify RN    Comment 2 Document in Chart    No results found.     Medical Problem List and Plan: 1.  Left-sided weakness/dysphagia secondary to right MCA/ACA infarction with R ICA,R MCA occlusion with cerebral edema status post hemicraniectomy with abdominal flap implant 123XX123 complicated by cerebral abscess/empyema hemorrhagic conversion  -admit to inpatient rehab   -ELOS 3-4 weeks, mod assist goals 2.  Antithrombotics: -DVT/anticoagulation: Right peroneal, left popliteal, left posterior tibial and left peroneal DVT.  Status post IVC filter 07/21/2019 with plan for retrieval 8 to 12 weeks.  Patient currently remains on subcutaneous heparin.  No plan for long-term anticoagulation  -antiplatelet therapy: N/A 3. Pain Management: Tramadol as needed 4. Mood: Provide motional support  -antipsychotic agents: N/A 5. Neuropsych: This patient is not capable of making decisions on his own behalf. 6. Skin/Wound Care: Routine skin checks 7. Fluids/Electrolytes/Nutrition: Routine in and outs.   -monitor closely given diet/current neuro status 8.  Dysphagia.  Dysphagia #2 with nectar liquids.  Follow-up speech therapy  -will need assistance with meals  -encourage po 9.  Seizure prophylaxis.  Keppra 500 mg twice daily 10.  Cerebral abscess/empyema/cerebritis.  Cultures no growth to date.  Continue vancomycin and cefepime 08/20/2019 x 6 to 8 weeks per infectious  disease. 11.  Pseudomonas UTI.  Zosyn completed 12.  Hypertension with tachycardia.  Lopressor 50 mg every 8 hours. 13.  History of tobacco marijuana use.  Urine drug screen positive marijuana.  Provide counseling with family    Elizabeth Sauer 09/06/2019

## 2019-09-06 NOTE — TOC Transition Note (Signed)
Transition of Care [facility]) - CM/SW Discharge Note Marvetta Gibbons RN,BSN Transitions of Care Unit 4NP coverage - RN Case Manager [phone]   Patient Details  Name: Matthew Black MRN: [id number] Date of Birth: [birth year]  Transition of Care [facility]) CM/SW Contact:  Dawayne Patricia, RN Phone Number: 09/06/2019, 12:35 PM   Clinical Narrative:    Pt stable for transition to [facility] INPT rehab today. Notified by Pamala Hurry with CIR that pt has a bed available today and CIR able to admit later this afternoon.    Final next level of care: IP Rehab Facility Barriers to Discharge: Barriers Resolved, No Barriers Identified   Patient Goals and CMS Choice Patient states their goals for this hospitalization and ongoing recovery are:: Patient is nonverbal. Per family they want patient to work their way back to returning to home when medically stable. CMS Medicare.gov Compare Post Acute Care list provided to:: Patient Represenative (must comment)(Patient's spouse) Choice offered to / list presented to : Spouse  Discharge Placement            [facility] INPT rehab           Discharge Plan and Services In-house Referral: Financial Counselor Discharge Planning Services: CM Consult Post Acute Care Choice: [place]          DME Arranged: N/A DME Agency: NA       HH Arranged: NA HH Agency: NA        Social Determinants of Health (SDOH) Interventions     Readmission Risk Interventions Readmission Risk Prevention Plan 09/06/2019  Transportation Screening (No Data)  Some recent data might be hidden

## 2019-09-06 NOTE — Progress Notes (Addendum)
Patient arrived to unit via bed from 4N. Patient accompanied by wife Matthew Black). Patient  is nonverbal. Patient  and wife oriented to floor, and educated about safety and rehab plan.  No pain or distressed noted.

## 2019-09-06 NOTE — Progress Notes (Signed)
Occupational Therapy Assessment and Plan  Patient Details  Name: Matthew Black MRN: [id number] Date of Birth: 04-22-1976  OT Diagnosis: abnormal posture, cognitive deficits, hemiplegia affecting non-dominant side and muscle weakness (generalized) Rehab Potential: Rehab Potential (ACUTE ONLY): Good ELOS: 4 weeks   Today's Date: 09/07/2019 OT Individual Time: 4665-9935 OT Individual Time Calculation (min): 58 min     Problem List:  Patient Active Problem List   Diagnosis Date Noted  . 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 Cerebral abscess   . Urinary tract infection without hematuria   . Altered mental status   . Primary hypercoagulable state ([place])   . Acute pulmonary embolism without acute cor pulmonale (HCC)   . Deep vein thrombosis (DVT) of non-extremity vein   . Hypokalemia   . FUO (fever of unknown origin)   . Acute blood loss anemia   . SIRS (systemic inflammatory response syndrome) (HCC)   . Leukocytosis   . Endotracheal tube present   . Acute respiratory failure with hypoxemia ([place])   . Stroke (cerebrum) ([place]) 07/19/2019  . Pressure injury of skin 07/19/2019  . Acute CVA (cerebrovascular accident) ([place])   . Encephalopathy   . Dysphagia   . Acute encephalopathy   . Essential hypertension   . Annual physical exam 09/11/2016  . Obesity 03/12/2016  . PCP NOTES >>>>>>>>>>>>>>>>>. 03/12/2016  . Scalp abscess 12/20/2015  . Neck abscess 12/20/2015  . Pilonidal cyst 02/08/2013    Past Medical History:  Past Medical History:  Diagnosis Date  . 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 CRANIOTOMY Right 07/19/2019   Procedure: RIGHT HEMI-CRANIECTOMY With implantation of skull flap to abdominal wall;  Surgeon: Consuella Lose, MD;  Location: [facility];  Service: Neurosurgery;  Laterality: Right;  . CYST EXCISION N/A 10/08/2016   Procedure: EXCISION OF POSTERIOR NECK CYST;  Surgeon:  Clovis Riley, MD;  Location: [facility];  Service: General;  Laterality: N/A;  . INCISION AND DRAINAGE ABSCESS N/A 09/22/2014   Procedure: INCISION AND DRAINAGE ABSCESS POSTERIOR NECK;  Surgeon: Pedro Earls, MD;  Location: [facility];  Service: General;  Laterality: N/A;  . INCISION AND DRAINAGE ABSCESS N/A 12/20/2015   Procedure: INCISION AND DRAINAGE POSTERIOR NECK MASS;  Surgeon: Armandina Gemma, MD;  Location: [facility];  Service: General;  Laterality: N/A;  . INCISION AND DRAINAGE ABSCESS Left 07/10/2004   middle finger  . IR IVC FILTER PLMT / S&I /IMG GUID/MOD SED  07/21/2019  . IR VENOGRAM RENAL UNI RIGHT  07/21/2019  . MASS EXCISION N/A 07/21/2017   Procedure: EXCISION OF BENIGN NECK LESION WITH LAYERED CLOSURE;  Surgeon: Irene Limbo, MD;  Location: [facility];  Service: Plastics;  Laterality: N/A;  . MASS EXCISION N/A 11/10/2017   Procedure: EXCISION BENIGN LESION OF THE NECK WITH LAYERED CLOSURE;  Surgeon: Irene Limbo, MD;  Location: [facility];  Service: Plastics;  Laterality: N/A;    Assessment & Plan Clinical Impression: Patient is a 43 y.o. year old male with recent admission to the hospital on 07/19/2019 after being found down.  He had dried vomit around his mouth and was covered in urine.  Nonverbal not moving his left side.  CT of the head showed a large right hemisphere infarction with confluence cytotoxic edema in the right ACA and MCA territories.  Evidence of large vessel occlusion.  CT cervical spine negative.  MRI/MRA showed right ICA occlusion.  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  Alcohol negative, urine drug screen positive marijuana, creatinine 1.43, urine culture no growth, COVID negative.  Patient underwent decompressive right hemicraniectomy with abdominal flap implant 07/19/2019 per Dr. Kathyrn Sheriff.  Echocardiogram with ejection fraction of 65%.  Bubble study negative for PFO.  EEG with severe diffuse  encephalopathy no seizure noted.  Hospital course further complicated by lower extremity Doppler showing a DVT right peroneal, left popliteal, left posterior tibial and left peroneal.  He underwent IVC filter placement 07/21/2019 with plan for retrieval in 8 to 12 weeks.  He was not a candidate for long-term anticoagulation due to craniotomy but patient is currently maintained on subcutaneous heparin 5000 units every 8 hours.  Patient n.p.o. with Cortrak feeding tube for nutritional support and diet has been advanced to dysphagia #2 nectar liquid and nasogastric tube feeds discontinued.  Keppra for seizure prophylaxis.  He remained intubated 07/20/2019 through 07/25/2019.  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  He underwent aspiration on 08/21/2019 per Dr. Kathyrn Sheriff and cultures were sent with no growth to date.  He was not a candidate for surgery due to increasing herniation.  ID consulted placed  on IV vancomycin and cefepime 08/20/2019 x6 to 8 weeks.  Patient also developed Pseudomonas UTI completing a course of Zosyn.  Palliative care consulted 08/22/2019 to establish goals of care.  Therapy evaluations completed and patient was admitted for a comprehensive rehab program.  Patient transferred to CIR on 09/06/2019 .    Patient currently requires total with basic self-care skills secondary to muscle weakness, decreased cardiorespiratoy endurance, impaired timing and sequencing, abnormal tone, unbalanced muscle activation, decreased coordination and decreased motor planning, decreased midline orientation, decreased attention to left and left side neglect, decreased initiation, decreased attention, decreased awareness, decreased problem  solving, decreased safety awareness, decreased memory and delayed processing and decreased sitting balance, decreased standing balance, decreased postural control, hemiplegia and decreased balance strategies.  Prior to hospitalization, patient could complete BADL with independent .  Patient will benefit from skilled intervention to decrease level of assist with basic self-care skills prior to discharge home with care partner.  Anticipate patient will require moderate physical assestance and follow up home health.  OT - End of Session Endurance Deficit: Yes Endurance Deficit Description: multiple rest breaks within BADL tasks OT Assessment Rehab Potential (ACUTE ONLY): Good OT Patient demonstrates impairments in the following area(s): Balance;Behavior;Cognition;Endurance;Motor;Perception;Safety;Sensory OT Basic ADL's Functional Problem(s): Eating;Grooming;Bathing;Toileting;Dressing OT Transfers Functional Problem(s): Toilet;Tub/Shower OT Additional Impairment(s): Fuctional Use of Upper Extremity OT Plan OT Intensity: Minimum of 1-2 x/day, 45 to 90 minutes OT Frequency: 5 out of 7 days OT Duration/Estimated Length of Stay: 4 weeks OT Treatment/Interventions: Balance/vestibular training;Cognitive remediation/compensation;Community reintegration;Discharge planning;Disease mangement/prevention;DME/adaptive equipment instruction;Functional electrical stimulation;Functional mobility training;Neuromuscular re-education;Patient/family education;Psychosocial support;Self Care/advanced ADL retraining;Splinting/orthotics;Therapeutic Activities;Therapeutic Exercise;UE/LE Strength taining/ROM;UE/LE Coordination activities;Wheelchair propulsion/positioning;Visual/perceptual remediation/compensation OT Self Feeding Anticipated Outcome(s): Min A OT Basic Self-Care Anticipated Outcome(s): Mod A OT Toileting Anticipated Outcome(s): Mod A OT Bathroom Transfers Anticipated Outcome(s): Mod A OT  Recommendation Patient destination: Home Follow Up Recommendations: Home health OT Equipment Recommended: To be determined  Skilled Therapeutic Intervention Pt greeted semi-reclined in bed with HOB at 30 degrees and nursing feeding pt, then nursing administered medications. Pt intermittently with eyes closed,  but easy to wake and nodded head "yes" to working with therapy. Pt nonverbal throughout session, but followed 1 step commands well. Pt was able to track OTs hand to the L wide with mod cues in quiet environment. Pt with dense L hemiplegia and tone in L UE and LE. Pt needed total A +2 for rolling L and R to wash buttocks and don new brief. Pt followed commands to wash his face and anr R upper thigh in bed. Pt attempted to reach towards L upper thigh when instructed by OT, but could not reach with R arm. Total A +2 to don pants in bed, but pt was able to try to help pull pants up R hip with slight hip bridge which pt initiated without cues. Pt came to sitting EOB with Total A +2. Pt needed max/total A to maintain sitting balance 2/2 posterior and lateral lean to L. Rehab tech assisted with sitting balance, while OT engaged pt in UB bathing/dressing tasks. Pt was able to follow commands to wash parts of upper body. He also used deodorant correctly. Pt completed 1 sit<>stand at EOB with max/total A +2 and facilitation to bring chest up, but difficulty achieving full upright. Pt returned to bed after stand with total A +2. OT educated on OT role, POC, and ELOS. Pt nodded head in agreement. Bed placed in chair position and L UE supported on pillow. R mit placed back on hand to keep pt from touching flap. Bed alarm on and needs met.   OT Evaluation Precautions/Restrictions  Precautions Precautions: Fall;Other (comment) Precaution Comments: crani helmet when OOB Required Braces or Orthoses: Other Brace Restrictions Weight Bearing Restrictions: No Pain  denies pain  Home Living/Prior Functioning Home  Living Family/patient expects to be discharged to:: Private residence Living Arrangements: Spouse/significant other, Children Available Help at Discharge: Family, Available 24 hours/day(wife primary caregiver, pt's parents and in-laws planning to provide help PRN) Type of Home: House Home Access: Stairs to enter Technical brewer of Steps: 3(discussed w/ wife need for ramp) Home Layout: Two level, Bed/bath upstairs, 1/2 bath on main level Alternate Level Stairs-Number of Steps: flight Bathroom Shower/Tub: Walk-in shower(upstairs) Armed forces training and education officer: Yes  Lives With: Spouse, Family IADL History IADL Comments: Worked full time with his own Marketing executive business Prior Function Level of Independence: Independent with basic ADLs, Independent with homemaking with ambulation, Independent with gait, Independent with transfers  Able to Take Stairs?: Yes Driving: Yes Vocation: Full time employment 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 Comments: working ADL ADL Eating: Dependent Grooming: Maximal assistance Upper Body Bathing: Maximal assistance Lower Body Bathing: Dependent Upper Body Dressing: Maximal assistance Lower Body Dressing: Dependent Toileting: Dependent Toilet Transfer: (safety/medical ) Tub/Shower Transfer: (safety/medical) Vision Additional Comments: Pt tracking in all 4 quadrants, slightly delayed to the L Perception  Perception: Impaired(L inattention) Praxis Praxis: Intact Cognition Overall Cognitive Status: Impaired/Different from baseline Arousal/Alertness: Lethargic Orientation Level: Nonverbal/unable to assess Year: (UTA) Month: (UTA) Day of Week: (UTA) Memory: (Unable to assess due to lethargy. TBA) Immediate Memory Recall: (UTA 2/2 non verbal) Attention: Focused;Sustained Focused Attention: Appears intact Focused Attention Impairment: Functional basic;Verbal basic Sustained Attention:  Impaired Sustained Attention Impairment: Verbal basic;Functional basic Awareness: Impaired Problem Solving: Impaired Behaviors: Restless Safety/Judgment: Impaired Sensation Sensation Light Touch: Appears Intact(Pt nods head "yes" that it feels normal, but unclear) Coordination Gross Motor Movements are Fluid and Coordinated: No Fine Motor Movements are Fluid and Coordinated: No Coordination and Movement Description: dense L hemi Motor  Motor Motor: Hemiplegia;Abnormal tone;Primitive reflexes present;Abnormal postural alignment and control Motor - Skilled Clinical Observations: Dense L hemi, increased hamstring and quad tone, clonus Mobility  Bed Mobility Bed Mobility: Rolling Right;Supine to Sit;Sit to Supine;Rolling Left Rolling Right: 2 Helpers Rolling Left: 2 Helpers Supine to Sit: 2 Helpers Sit to Supine: 2 Helpers Transfers Sit to Stand: 2 Helpers Stand to Sit: 2 Helpers  Trunk/Postural Assessment  Cervical Assessment Cervical Assessment: Exceptions to WFL(severe cervical protraction, R rotation bias) Thoracic Assessment Thoracic Assessment: Exceptions to WFL(rounded shoulders) Lumbar Assessment Lumbar Assessment: Exceptions to WFL(posterior pelvic tilt) Postural Control Postural Control: Deficits on evaluation Head Control: able to correct w/ cues Trunk Control: absent, strong L lateral lean Righting Reactions: absent Protective Responses: absent  Balance Balance Balance Assessed: Yes Static Sitting Balance Static Sitting - Level of Assistance: 2: Max assist;1: +1 Total assist Dynamic Sitting Balance Dynamic Sitting - Level of Assistance: 2: Max assist;1: +1 Total assist Static Standing Balance Static Standing - Level of Assistance: 1: +2 Total assist Extremity/Trunk Assessment RUE Assessment RUE Assessment: Within Functional Limits LUE Assessment LUE Assessment: Exceptions to [facility] General Strength Comments: Pt with mild flexor tone throughout L UE- no  voluntary movement noted LUE Body System: Neuro Brunstrum levels for arm and hand: Arm;Hand Brunstrum level for arm: Stage II Synergy is developing Brunstrum level for hand: Stage II Synergy is developing LUE Tone LUE Tone: Mild     Refer to Care Plan for Long Term Goals  Recommendations for other services: None    Discharge Criteria: Patient will be discharged from OT if patient refuses treatment 3 consecutive times without medical reason, if treatment goals not met, if there is a change in medical status, if patient makes no progress towards goals or if patient is discharged from hospital.  The above assessment, treatment plan, treatment alternatives and goals were discussed and mutually agreed upon: by patient  Valma Cava 09/07/2019, 3:59 PM

## 2019-09-07 ENCOUNTER — Inpatient Hospital Stay (HOSPITAL_COMMUNITY): Payer: Medicaid Other | Admitting: Physical Therapy

## 2019-09-07 ENCOUNTER — Inpatient Hospital Stay (HOSPITAL_COMMUNITY): Payer: Medicaid Other | Admitting: Occupational Therapy

## 2019-09-07 ENCOUNTER — Inpatient Hospital Stay (HOSPITAL_COMMUNITY): Payer: Medicaid Other | Admitting: Speech Pathology

## 2019-09-07 DIAGNOSIS — I824Z3 Acute embolism and thrombosis of unspecified deep veins of distal lower extremity, bilateral: Secondary | ICD-10-CM

## 2019-09-07 LAB — COMPREHENSIVE METABOLIC PANEL
ALT: 92 U/L — ABNORMAL HIGH (ref 0–44)
AST: 49 U/L — ABNORMAL HIGH (ref 15–41)
Albumin: 2.8 g/dL — ABNORMAL LOW (ref 3.5–5.0)
Alkaline Phosphatase: 109 U/L (ref 38–126)
Anion gap: 10 (ref 5–15)
BUN: 8 mg/dL (ref 6–20)
CO2: 24 mmol/L (ref 22–32)
Calcium: 9.3 mg/dL (ref 8.9–10.3)
Chloride: 105 mmol/L (ref 98–111)
Creatinine, Ser: 0.61 mg/dL (ref 0.61–1.24)
GFR calc Af Amer: >60 mL/min (ref >60)
GFR calc non Af Amer: >60 mL/min (ref >60)
Glucose, Bld: 104 mg/dL — ABNORMAL HIGH (ref 70–99)
Potassium: 3.4 mmol/L — ABNORMAL LOW (ref 3.5–5.1)
Sodium: 139 mmol/L (ref 135–145)
Total Bilirubin: 0.6 mg/dL (ref 0.3–1.2)
Total Protein: 6.4 g/dL — ABNORMAL LOW (ref 6.5–8.1)

## 2019-09-07 LAB — CBC WITH DIFFERENTIAL/PLATELET
Abs Immature Granulocytes: 0.02 10*3/uL (ref 0.00–0.07)
Basophils Absolute: 0.1 10*3/uL (ref 0.0–0.1)
Basophils Relative: 1 %
Eosinophils Absolute: 0.1 10*3/uL (ref 0.0–0.5)
Eosinophils Relative: 2 %
HCT: 35.7 % — ABNORMAL LOW (ref 39.0–52.0)
Hemoglobin: 11.9 g/dL — ABNORMAL LOW (ref 13.0–17.0)
Immature Granulocytes: 0 %
Lymphocytes Relative: 44 %
Lymphs Abs: 2.4 10*3/uL (ref 0.7–4.0)
MCH: 28.4 pg (ref 26.0–34.0)
MCHC: 33.3 g/dL (ref 30.0–36.0)
MCV: 85.2 fL (ref 80.0–100.0)
Monocytes Absolute: 0.8 10*3/uL (ref 0.1–1.0)
Monocytes Relative: 14 %
Neutro Abs: 2.2 10*3/uL (ref 1.7–7.7)
Neutrophils Relative %: 39 %
Platelets: 242 10*3/uL (ref 150–400)
RBC: 4.19 MIL/uL — ABNORMAL LOW (ref 4.22–5.81)
RDW: 14.7 % (ref 11.5–15.5)
WBC: 5.6 10*3/uL (ref 4.0–10.5)
nRBC: 0 % (ref 0.0–0.2)

## 2019-09-07 MED ORDER — POTASSIUM CHLORIDE CRYS ER 20 MEQ PO TBCR
20.0000 meq | EXTENDED_RELEASE_TABLET | Freq: Every day | ORAL | Status: AC
  Administered 2019-09-07 – 2019-09-09 (×3): 20 meq via ORAL
  Filled 2019-09-07 (×3): qty 1

## 2019-09-07 MED ORDER — LEVETIRACETAM 100 MG/ML PO SOLN
500.0000 mg | Freq: Two times a day (BID) | ORAL | Status: DC
  Administered 2019-09-07 – 2019-09-14 (×14): 500 mg
  Filled 2019-09-07 (×14): qty 5

## 2019-09-07 NOTE — Progress Notes (Signed)
[facility] PHYSICAL MEDICINE & REHABILITATION PROGRESS NOTE   Subjective/Complaints: Had an uneventful night. Pt appears comfortable. Nodded head no when I asked him if he had any problems  ROS: Limited due to cognitive/behavioral    Objective:   No results found. Recent Labs    09/05/19 0500 09/07/19 0440  WBC 5.5 5.6  HGB 11.6* 11.9*  HCT 34.9* 35.7*  PLT 242 242   Recent Labs    09/05/19 0500 09/07/19 0440  NA 137 139  K 3.6 3.4*  CL 102 105  CO2 26 24  GLUCOSE 100* 104*  BUN 6 8  CREATININE 0.58* 0.61  CALCIUM 9.0 9.3    Intake/Output Summary (Last 24 hours) at 09/07/2019 0852 Last data filed at 09/07/2019 0409 Gross per 24 hour  Intake -  Output 2000 ml  Net -2000 ml     Physical Exam: Vital Signs Blood pressure (!) 132/95, pulse 79, temperature 98.7 F (37.1 C), temperature source Oral, resp. rate 17, height 5\' 11"  (1.803 m), weight 88 kg, SpO2 100 %. Constitutional: No distress . Vital signs reviewed. HEENT: EOMI, oral membranes moist, crani site with swelling, intact  Neck: supple Cardiovascular: RRR without murmur. No JVD    Respiratory: CTA Bilaterally without wheezes or rales. Normal effort    GI: BS +, non-tender, non-distended  Musculoskeletal:Normal range of motion.  Neuro:  Left central 7. Does track to all visual fields although has right gaze preference. Non-verbal. Did follow simple commands. Nods head y/n with ?50% accuracy.  Dense left hemiparesis. 0/5 LUE and LLE. At least 3-4/5 RUE and RLE. Senses pain on right side, minimal withdrawal on left. DTR's 3+ LUE, LLE, toes up. Skin: Skin iswarmand dry.  Psychiatric: Flat, remains cooperative    Assessment/Plan: 1. 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  Physiatrist is providing close team supervision and 24 hour management of active medical problems listed  below.  Physiatrist and rehab team continue to assess barriers to discharge/monitor patient progress toward functional and medical goals  Care Tool:  Bathing              Bathing assist       Upper Body Dressing/Undressing Upper body dressing        Upper body assist      Lower Body Dressing/Undressing Lower body dressing            Lower body assist       Toileting Toileting    Toileting assist       Transfers Chair/bed transfer  Transfers assist           Locomotion Ambulation   Ambulation assist              Walk 10 feet activity   Assist           Walk 50 feet activity   Assist           Walk 150 feet activity   Assist           Walk 10 feet on uneven surface  activity   Assist           Wheelchair     Assist               Wheelchair 50 feet with 2 turns activity    Assist            Wheelchair 150 feet activity  Assist          Blood pressure (!) 132/95, pulse 79, temperature 98.7 F (37.1 C), temperature source Oral, resp. rate 17, height 5\' 11"  (1.803 m), weight 88 kg, SpO2 100 %. Medical Problem List and Plan: 1.Left-sided weakness/dysphagiasecondary to right MCA/ACA infarction withR ICA,R MCAocclusion with cerebral edema status post hemicraniectomy with abdominal flap implant [id number] complicated bycerebral abscess/empyema hemorrhagic conversion beginning therapies today -ELOS 3-4 weeks, mod assist goals  -PRAFO, WHO Left 2. Antithrombotics: -DVT/anticoagulation:Right peroneal, left popliteal, left posterior tibial and left peroneal DVT. Status post IVC filter 07/21/2019 with plan for retrieval 8 to 12 weeks. Patient currently remains on subcutaneous heparin. No plan for long-term anticoagulation -antiplatelet therapy: N/A 3. Pain Management:Tramadol as needed 4. Mood:Provide motional  support -antipsychotic agents: N/A 5. Neuropsych: This patientis notcapable of making decisions on hisown behalf. 6. Skin/Wound Care:Routine skin checks 7. Fluids/Electrolytes/Nutrition:Routine in and outs.  -I personally reviewed the patient's labs today.    -replete mild K+ deficit  -mild elevation of LFT's, increased from 10/11---follow up later this week or Monday 8. Dysphagia. Dysphagia #2withnectar liquids. Follow-up speech therapy -will need assistance with meals -encourage po 9. Seizure prophylaxis. Keppra 500 mg twice daily 10. Cerebral abscess/empyema/cerebritis. Cultures no growth to date. Continue vancomycin and cefepime 08/20/2019 x 6 to 8 weeks per infectious disease.  -wbc normal, afebrile 11. Pseudomonas UTI. Zosyn completed 12. Hypertension with tachycardia. Lopressor 50 mg every 8 hours.  -dbp elevated---observe 13. History of tobacco marijuana use. Urine drug screen positive marijuana. Provide counseling with family      LOS: 1 days A FACE TO FACE EVALUATION WAS PERFORMED  Meredith Staggers 09/07/2019, 8:52 AM

## 2019-09-07 NOTE — Progress Notes (Signed)
Orthopedic Tech Progress Note Patient Details:  Matthew Black [birth year] [id number]  Patient ID: [name], male   DOB: 1976/01/04, 43 y.o.   MRN: [id number]   [place] Pink 09/07/2019, 11:57 AMCalled [facility] for right Prafo boot and right WHO splint.

## 2019-09-07 NOTE — Evaluation (Signed)
Speech Language Pathology Assessment and Plan  Patient Details  Name: Matthew Black MRN: [id number] Date of Birth: August 08, 1976  SLP Diagnosis: Dysphagia;Cognitive Impairments;Speech and Language deficits  Rehab Potential: Fair ELOS: 4 weeks    Today's Date: 09/07/2019 SLP Individual Time: 1100-1155 SLP Individual Time Calculation (min): 55 min   Problem List:  Patient Active Problem List   Diagnosis Date Noted  . 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 Cerebral abscess   . Urinary tract infection without hematuria   . Altered mental status   . Primary hypercoagulable state ([place])   . Acute pulmonary embolism without acute cor pulmonale (HCC)   . Deep vein thrombosis (DVT) of non-extremity vein   . Hypokalemia   . FUO (fever of unknown origin)   . Acute blood loss anemia   . SIRS (systemic inflammatory response syndrome) (HCC)   . Leukocytosis   . Endotracheal tube present   . Acute respiratory failure with hypoxemia ([place])   . Stroke (cerebrum) ([place]) 07/19/2019  . Pressure injury of skin 07/19/2019  . Acute CVA (cerebrovascular accident) ([place])   . Encephalopathy   . Dysphagia   . Acute encephalopathy   . Essential hypertension   . Annual physical exam 09/11/2016  . Obesity 03/12/2016  . PCP NOTES >>>>>>>>>>>>>>>>>. 03/12/2016  . Scalp abscess 12/20/2015  . Neck abscess 12/20/2015  . Pilonidal cyst 02/08/2013   Past Medical History:  Past Medical History:  Diagnosis Date  . 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 CRANIOTOMY Right 07/19/2019   Procedure: RIGHT HEMI-CRANIECTOMY With implantation of skull flap to abdominal wall;  Surgeon: Consuella Lose, MD;  Location: [facility];  Service: Neurosurgery;  Laterality: Right;  . CYST EXCISION N/A 10/08/2016   Procedure: EXCISION OF POSTERIOR NECK CYST;  Surgeon: Clovis Riley, MD;  Location: [facility];  Service: General;  Laterality:  N/A;  . INCISION AND DRAINAGE ABSCESS N/A 09/22/2014   Procedure: INCISION AND DRAINAGE ABSCESS POSTERIOR NECK;  Surgeon: Pedro Earls, MD;  Location: [facility];  Service: General;  Laterality: N/A;  . INCISION AND DRAINAGE ABSCESS N/A 12/20/2015   Procedure: INCISION AND DRAINAGE POSTERIOR NECK MASS;  Surgeon: Armandina Gemma, MD;  Location: [facility];  Service: General;  Laterality: N/A;  . INCISION AND DRAINAGE ABSCESS Left 07/10/2004   middle finger  . IR IVC FILTER PLMT / S&I /IMG GUID/MOD SED  07/21/2019  . IR VENOGRAM RENAL UNI RIGHT  07/21/2019  . MASS EXCISION N/A 07/21/2017   Procedure: EXCISION OF BENIGN NECK LESION WITH LAYERED CLOSURE;  Surgeon: Irene Limbo, MD;  Location: [facility];  Service: Plastics;  Laterality: N/A;  . MASS EXCISION N/A 11/10/2017   Procedure: EXCISION BENIGN LESION OF THE NECK WITH LAYERED CLOSURE;  Surgeon: Irene Limbo, MD;  Location: [facility];  Service: Plastics;  Laterality: N/A;    Assessment / Plan / Recommendation Clinical Impression Patient is a 43 year old right-handed male with unremarkable past medical history on no prescription medications. He quit smoking 3 years ago. 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 Presented 07/19/2019 after being found down. He had dried vomit around his mouth and was covered in urine. Nonverbal not moving his left side. CT of the head showed a large right hemisphere infarction with confluence cytotoxic edema in the right ACA and MCA  territories. Evidence of large vessel occlusion. CT cervical spine negative. MRI/MRA showed right ICA occlusion. 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 Alcohol negative, urine drug screen positive marijuana, creatinine 1.43, urine culture no growth, COVID negative. Patient underwent decompressive right  hemicraniectomy with abdominal flap implant 07/19/2019 per Dr. Kathyrn Sheriff. Echocardiogram with ejection fraction of 65%. Bubble study negative for PFO. EEG with severe diffuse encephalopathy no seizure noted. Hospital course further complicated by lower extremity Doppler showing a DVT right peroneal, left popliteal, left posterior tibial and left peroneal. He underwent IVC filter placement 07/21/2019 with plan for retrieval in 8 to 12 weeks. He was not a candidate for long-term anticoagulation due to craniotomy but patient is currently maintained on subcutaneous heparin 5000 units every 8 hours. Patient n.p.o. with Cortrakfeeding tube for nutritional support and diet has been advanced to dysphagia #2 nectar liquid and nasogastric tube feeds discontinued. Keppra for seizure prophylaxis. He remained intubated 07/20/2019 through 07/25/2019. 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 He underwent aspiration on 08/21/2019 per Dr. Kathyrn Sheriff and cultures were sent with no growth to date. He was not a candidate for surgery due to increasing herniation. ID consulted placed on IV vancomycin and cefepime 08/20/2019 x6 to 8 weeks. Patient also developed Pseudomonas UTI completing a course of Zosyn. Palliative care consulted 08/22/2019 to establish goals of care. Therapy evaluations completed and patient was admitted for a comprehensive rehab program 09/06/19.  Patient demonstrates moderate-severe cognitive impairments characterized by restlessness, decreased initiation, decreased sustained attention, decreased attention to left, and decreased problem solving. Patient also demonstrates mildly impaired auditory comprehension with 80% accuracy  with yes/no questions and ability to follow 3-step commands. Patient's motor planning also appeared intact during commands and throughout functional tasks. Patient was nonverbal throughout evaluation without attempts to vocalize but utilized head nods and gestures for communication. Patient also indicated he could speak to some degree but was choosing not to. Patient's wife later reported he does talk to her at times at the phrase level. Patient consumed Dys. 2 textures with nectar-thick liquids without overt s/s of aspiration but trials were limited due to restlessness and ability for patient to stay upright in bed for safety with PO intake. Recommend patient continue current diet. Patient would benefit from skilled SLP intervention to maximize his cognitive-linguistic and swallowing function prior to discharge.    Skilled Therapeutic Interventions          Administered a BSE and cognitive-linguistic evaluation, please see above for details.    SLP Assessment  Patient will need skilled Speech Lanaguage Pathology Services during CIR admission    Recommendations  SLP Diet Recommendations: Dysphagia 2 (Fine chop);Nectar Medication Administration: Crushed with puree Supervision: Patient able to self feed;Full supervision/cueing for compensatory strategies Compensations: Minimize environmental distractions;Slow rate;Small sips/bites;Lingual sweep for clearance of pocketing Postural Changes and/or Swallow Maneuvers: Seated upright 90 degrees Oral Care Recommendations: Oral care BID Patient destination: Home Follow up Recommendations: Home Health SLP;24 hour supervision/assistance Equipment Recommended: To be determined    SLP Frequency 3 to 5 out of 7 days   SLP Duration  SLP Intensity  SLP Treatment/Interventions 4 weeks  Minumum of 1-2 x/day, 30 to 90 minutes  Cognitive remediation/compensation;Dysphagia/aspiration precaution training;Internal/external aids;Speech/Language  facilitation;Therapeutic Activities;Cueing hierarchy;Environmental controls;Functional tasks;Patient/family education  Pain No/Denies Pain   Prior Functioning Type of Home: (P) House  Lives With: (P) Spouse;Family Available Help at Discharge: (P) Family;Available 24 hours/day(wife primary caregiver, pt's parents and in-laws planning to provide help PRN) Vocation: (P) Full time employment  Refer to Care Plan for Long Term Goals  Recommendations for other services: None   Discharge Criteria: Patient will be discharged from SLP if patient refuses treatment 3 consecutive times without medical reason, if treatment goals not met, if there is a change in medical status, if patient makes no progress towards goals or if patient is discharged from hospital.  The above assessment, treatment plan, treatment alternatives and goals were discussed and mutually agreed upon: by patient  Lanny Lipkin 09/07/2019, 3:55 PM

## 2019-09-07 NOTE — Progress Notes (Signed)
Inpatient Rehabilitation  Patient information reviewed and entered into eRehab system by Limuel Nieblas M. Makell Cyr, M.A., CCC/SLP, PPS Coordinator.  Information including medical coding, functional ability and quality indicators will be reviewed and updated through discharge.    

## 2019-09-08 ENCOUNTER — Inpatient Hospital Stay (HOSPITAL_COMMUNITY): Payer: Medicaid Other | Admitting: Speech Pathology

## 2019-09-08 ENCOUNTER — Inpatient Hospital Stay (HOSPITAL_COMMUNITY): Payer: Medicaid Other | Admitting: Occupational Therapy

## 2019-09-08 ENCOUNTER — Inpatient Hospital Stay (HOSPITAL_COMMUNITY): Payer: Medicaid Other | Admitting: Physical Therapy

## 2019-09-08 NOTE — Progress Notes (Signed)
Occupational Therapy Session Note  Patient Details  Name: Matthew Black MRN: [id number] Date of Birth: February 19, 1976  Today's Date: 09/08/2019 OT Individual Time: 3335-4562 OT Individual Time Calculation (min): 60 min    Short Term Goals: Week 1:  OT Short Term Goal 1 (Week 1): Pt will tolerate sitting EOB for 5 minutes within BADL task. OT Short Term Goal 2 (Week 1): Pt will maintain sitting balance with mod A of 1 person OT Short Term Goal 3 (Week 1): Pt will complete UB dressing with max A of 1 OT Short Term Goal 4 (Week 1): Pt will locate 2/3 grooming items on the L side of the sink.  Skilled Therapeutic Interventions/Progress Updates:    Pt received in bed and awake.  Pt nodded yes to therapy. Encouraged pt to verbalize but no verbal response.  Worked on PROM to LUE focusing on scapula and shoulder, increased tone in arm. Slight active movement in finger flexors when pt asked to squeeze my hand. could be tone carry over.  Pt asked to do AROM of RUE, when lifting arm up he winced and pointed to back of neck.  Massage to back of neck and small heat pack applied to back of neck at end of session.  From supine, with B knees bent, therapist moved his legs side to side for trunk disassociation with pt trying to resist movement due to tightness in back and hips.    Worked on rolling with max A with pt using R hand to support L arm at elbow. With rolling, old brief removed and pt assisted with bathing, donning new brief and pants. He did cleanse perineal area, thighs and most of his UB with mod cues to attend to his L side.  Used hospital gown for UB as pt was on IV.  Helmet donned on pt.   To get out of bed, rolled to R with max A and with cues pushed up with R arm to be able to come to sit with max A. Sat EOB with max A with positioning strategies to reach R arm out to side as he was leaning to L and back.    Used stedy to transfer to wc with max to rise to stand and then total A of 2 to  maintain balance as pt pushing to L as he was trying to push through his R leg too much.  Pt positioned in tilt in space w/c with full lap tray to support arm and positioning.    Belt alarm on and pt reclined back. Call light in reach and all needs met.    Therapy Documentation Precautions:  Precautions Precautions: Fall, Other (comment) Precaution Comments: crani helmet when OOB Required Braces or Orthoses: Other Brace Restrictions Weight Bearing Restrictions: No  Pain: Pain Assessment Pain Score: 0-No pain ADL: ADL Eating: Dependent Grooming: Maximal assistance Upper Body Bathing: Maximal assistance Lower Body Bathing: Dependent Upper Body Dressing: Maximal assistance Lower Body Dressing: Dependent Toileting: Dependent Toilet Transfer: (safety/medical ) Tub/Shower Transfer: (safety/medical)   Therapy/Group: Individual Therapy  [place] 09/08/2019, 10:02 AM

## 2019-09-08 NOTE — Progress Notes (Signed)
[facility] PHYSICAL MEDICINE & REHABILITATION PROGRESS NOTE   Subjective/Complaints: Alert sitting in bed. No obvious problems  ROS: limited d/t cognition.    Objective:   No results found. Recent Labs    09/07/19 0440  WBC 5.6  HGB 11.9*  HCT 35.7*  PLT 242   Recent Labs    09/07/19 0440  NA 139  K 3.4*  CL 105  CO2 24  GLUCOSE 104*  BUN 8  CREATININE 0.61  CALCIUM 9.3    Intake/Output Summary (Last 24 hours) at 09/08/2019 1010 Last data filed at 09/08/2019 0826 Gross per 24 hour  Intake 878 ml  Output -  Net 878 ml     Physical Exam: Vital Signs Blood pressure (!) 150/78, pulse 80, temperature 98.3 F (36.8 C), resp. rate 18, height 5\' 11"  (1.803 m), weight 88 kg, SpO2 100 %. Constitutional: No distress . Vital signs reviewed. HEENT: EOMI, oral membranes moist Neck: supple Cardiovascular: RRR without murmur. No JVD    Respiratory: CTA Bilaterally without wheezes or rales. Normal effort    GI: BS +, non-tender, non-distended  Musculoskeletal:Normal range of motion.  Neuro:  Left central 7. Does track to all visual fields although has right gaze preference. Remains non-verbal but follows simple commands. Uses head nods.  Dense left hemiparesis. 0/5 LUE and LLE. At least 3-4/5 RUE and RLE. Senses pain on right side, minimal withdrawal on left. DTR's 3+ LUE, LLE, toes up. Skin: Skin iswarmand dry. Line RUE Psychiatric: Flat, a little brighter today    Assessment/Plan: 1. 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  Physiatrist is providing close team supervision and 24 hour management of active medical problems listed below.  Physiatrist and rehab team continue to assess barriers to discharge/monitor patient progress toward functional and medical goals  Care Tool:  Bathing    Body parts bathed by patient: Chest, Abdomen, Right  upper leg, Left arm, Face   Body parts bathed by helper: Right arm, Front perineal area, Buttocks, Left upper leg, Left lower leg, Right lower leg     Bathing assist Assist Level: 2 Helpers     Upper Body Dressing/Undressing Upper body dressing   What is the patient wearing?: Hospital gown only    Upper body assist Assist Level: 2 Helpers    Lower Body Dressing/Undressing Lower body dressing      What is the patient wearing?: Incontinence brief     Lower body assist Assist for lower body dressing: 2 Helpers     Toileting Toileting Toileting Activity did not occur (Clothing management and hygiene only): N/A (no void or bm)  Toileting assist Assist for toileting: 2 Helpers     Transfers Chair/bed transfer  Transfers assist     Chair/bed transfer assist level: 2 Helpers     Locomotion Ambulation   Ambulation assist   Ambulation activity did not occur: Safety/medical concerns          Walk 10 feet activity   Assist  Walk 10 feet activity did not occur: Safety/medical concerns        Walk 50 feet activity   Assist Walk 50 feet with 2 turns activity did not occur: Safety/medical concerns         Walk 150 feet activity   Assist Walk 150 feet activity did not occur: Safety/medical concerns         Walk 10 feet on uneven surface  activity   Assist Walk 10 feet on uneven surfaces activity did not occur: Safety/medical concerns         Wheelchair     Assist Will patient use wheelchair at discharge?: Yes Type of Wheelchair: Manual Wheelchair activity did not occur: Safety/medical concerns         Wheelchair 50 feet with 2 turns activity    Assist    Wheelchair 50 feet with 2 turns activity did not occur: Safety/medical concerns       Wheelchair 150 feet activity     Assist  Wheelchair 150 feet activity did not occur: Safety/medical concerns       Blood pressure (!) 150/78, pulse 80, temperature 98.3 F  (36.8 C), resp. rate 18, height 5\' 11"  (1.803 m), weight 88 kg, SpO2 100 %. Medical Problem List and Plan: 1.Left-sided weakness/dysphagiasecondary to right MCA/ACA infarction withR ICA,R MCAocclusion with cerebral edema status post hemicraniectomy with abdominal flap implant [id number] complicated bycerebral abscess/empyema hemorrhagic conversion -Continue CIR therapies including PT, OT, and SLP  -ELOS 3-4 weeks, mod assist goals  -PRAFO, WHO Left  -still non-verbal although apparently has spoken to wife 2. Antithrombotics: -DVT/anticoagulation:Right peroneal, left popliteal, left posterior tibial and left peroneal DVT. Status post IVC filter 07/21/2019 with plan for retrieval 8 to 12 weeks. Patient currently remains on subcutaneous heparin. No plan for long-term anticoagulation -antiplatelet therapy: N/A 3. Pain Management:Tramadol as needed 4. Mood:Provide motional support -antipsychotic agents: N/A 5. Neuropsych: This patientis notcapable of making decisions on hisown behalf. 6. Skin/Wound Care:Routine skin checks 7. Fluids/Electrolytes/Nutrition:Routine in and outs.   -replete mild K+ deficit  -mild elevation of LFT's, increased from 10/11---follow up  Monday 8. Dysphagia. Dysphagia #2withnectar liquids. Follow-up speech therapy -will need assistance with meals, fair intake.  -not sure that all intake was recorded 9. Seizure prophylaxis. Keppra 500 mg twice daily 10. Cerebral abscess/empyema/cerebritis. Cultures no growth to date. Continue vancomycin and cefepime 08/20/2019 x 6 to 8 weeks per infectious disease.  -wbc normal, afebrile 11. Pseudomonas UTI. Zosyn completed 12. Hypertension with tachycardia. Lopressor 50 mg every 8 hours.  -dbp elevated---observe 13. History of tobacco marijuana use. Urine drug screen positive marijuana. Provide counseling with  family      LOS: 2 days A FACE TO FACE EVALUATION WAS PERFORMED  Meredith Staggers 09/08/2019, 10:10 AM

## 2019-09-08 NOTE — Progress Notes (Signed)
Orthopedic Tech Progress Note Patient Details:  Matthew Black 10/15/1976 [id number] While I was dropping off something for another patient, the RN for this patient stopped me and asked could I order patient a LEFT WRIST BRACE instead of RIGHT. She said nothing is wrong with the right side of his body its his left. And that they had already opened up the brace before they realized it was for the wrong side of the body. So I called [facility] and told them what happen and they will be out later. Patient ID: Matthew Black, male   DOB: 29-Oct-1976, 43 y.o.   MRN: [id number]   Janit Pagan 09/08/2019, 10:33 AM

## 2019-09-08 NOTE — Progress Notes (Signed)
Physical Therapy Session Note  Patient Details  Name: Matthew Black MRN: [id number] Date of Birth: Jan 27, 1976  Today's Date: 09/08/2019 PT Individual Time: 1300-1335 AND 1425-1515 PT Individual Time Calculation (min): 35 min AND 40 min  Short Term Goals: Week 1:  PT Short Term Goal 1 (Week 1): Pt will transfer bed<>chair w/ only +1 assist PT Short Term Goal 2 (Week 1): Pt will perform bed mobility w/ +1 assist PT Short Term Goal 3 (Week 1): Pt will tolerate 30 min of upright activity w/o increase in fatigue PT Short Term Goal 4 (Week 1): Pt will tolerate 60 min of sitting up in w/c in between therapy sessions  Skilled Therapeutic Interventions/Progress Updates:   Session 1:  Pt asleep in supine, mod tactile and verbal cues to arouse. No evidence of pain at first. Discussed goals of session and attempted to assist pt from supine to sitting EOB. Pt then actively resisting therapist's attempts at helping LEs move towards EOB. When asked if he wanted to participate in therapy, pt shook his head no. Pt then verbalized "I'm tired". Educated pt on importance of therapy participation to reach his goals, pt continued to shake his head "no". Per OT/SLP, pt likes to have heat applied to neck musculature 2/2 tightness/stiffness. Returned w/ moist heat and applied to posterior neck and upper trap musculature bilaterally. Pt confirmed w/ head nodding that this improved neck pain/stiffness, pt unable to rate. Total assist to reposition pt in bed to neutral positioning for pressure relief and posture. Brought pt up to chair position in attempts to engage w/ eating lunch. Pt did not initiate eating bites nor did he open mouth when therapist brought bites to mouth. Pt then fell asleep in supine. Ended session in supine, all needs met.   Session 2:  Returned later in afternoon to finish session. Pt remained in supine, confirms he has some relief in cervical stiffness w/ hot pack. Pt required max encouragement from  this therapist, rehab tech, and pt's wife to participate in therapy session. Supine>sit w/ total assist +2 and total assist static sitting balance while 2nd helper set up transfer. Stedy transfer to TIS +2 helpers to stand, however pt utilizing R side to pull/boost up well. Max tactile, verbal, and manual cues for midline orientation in sitting and in stance. Total assist w/c transport to/from therapy gym. Attempted to work on L visual scanning and building therapeutic alliance while performing simple card matching in seated. Pt initiated 1 card, and then had zero initiation after multiple minutes w/ cues. Tilted back and worked on cervical extension stretching w/ progressively decreased head support while reclined. Educated pt's wife on positioning in [place] and in bed to facilitate increased cervical extension/retraction and benefits on stiffness. Returned to room via w/c, ended session in [place] and in care of wife - all needs met.   Therapy Documentation Precautions:  Precautions Precautions: Fall, Other (comment) Precaution Comments: crani helmet when OOB Required Braces or Orthoses: Other Brace Restrictions Weight Bearing Restrictions: No Pain:    Therapy/Group: Individual Therapy  Lindy Garczynski K Tyrell Seifer 09/08/2019, 2:05 PM

## 2019-09-08 NOTE — Progress Notes (Signed)
Speech Language Pathology Daily Session Note  Patient Details  Name: AQUILA MAHI MRN: [id number] Date of Birth: 1975-11-29  Today's Date: 09/08/2019 SLP Individual Time: 1000-1055 SLP Individual Time Calculation (min): 55 min  Short Term Goals: Week 1: SLP Short Term Goal 1 (Week 1): Patient will consume trials of thin liquids without overt s/s of aspiration and Min verbal cues over 2 sessions to assess readiness for repeat MBS. SLP Short Term Goal 2 (Week 1): Patient will demonstrate efficient mastication with complete oral clearance with trials of Dys. 3 textures with Min verbal cues over 2 sessions prior to upgrade. SLP Short Term Goal 3 (Week 1): Patient will vocalize on command in 25% of opportunities with Max A multimodal cues. SLP Short Term Goal 4 (Week 1): Patient will demonstrate sustained attention to tasks for ~5 minutes with Max verbal cues for redirection. SLP Short Term Goal 5 (Week 1): Patient will scan to midline/left field of enviornment ot locate functional items with Max A multimodal cues. SLP Short Term Goal 6 (Week 1): Patient will demonstrate basic problem solving with Max A multimodal cues during functional tasks.  Skilled Therapeutic Interventions: Skilled treatment session focused on communication goals. Upon arrival, patient was awake but restless in wheelchair. SLP facilitated session by providing Max A verbal cues to match a written word to a functional object from a field of 2 with 25% accuracy. However, patient able to write his name, city, place (hospital) and situation with 100% accuracy and mild spelling errors. Patient was also oriented to month with Min verbal cues via written expression. Patient did not elicit verbal expression despite Max encouragement, therefore, clinician called the patient's wife without his knowledge and asked to call him on the phone in attempts to facilitate automatic language. However, when patient answered the phone, he did not appear  to verbalize.  Based on patient's grossly intact auditory comprehension, basic written expression abilities, reports from wife of verbalizing and minimal evidence of motor planning, suspect patient can verbalize to some degree although a cognitive-linguistic component is suspected. Towards end of session, after Max A multimodal cues, patient verbalized "pen" in response to naming th object. Patient indicated with use of gestures that he needed to use the bathroom. Patient left with NT. Continue with current plan of care.      Pain Pain Assessment Pain Score: 0-No pain  Therapy/Group: Individual Therapy Ryder Man 09/08/2019, 12:41 PM

## 2019-09-09 ENCOUNTER — Inpatient Hospital Stay (HOSPITAL_COMMUNITY): Payer: Medicaid Other | Admitting: Speech Pathology

## 2019-09-09 ENCOUNTER — Inpatient Hospital Stay (HOSPITAL_COMMUNITY): Payer: Medicaid Other | Admitting: Physical Therapy

## 2019-09-09 ENCOUNTER — Inpatient Hospital Stay (HOSPITAL_COMMUNITY): Payer: Medicaid Other | Admitting: Occupational Therapy

## 2019-09-09 LAB — BASIC METABOLIC PANEL
Anion gap: 10 (ref 5–15)
BUN: 8 mg/dL (ref 6–20)
CO2: 25 mmol/L (ref 22–32)
Calcium: 9.3 mg/dL (ref 8.9–10.3)
Chloride: 103 mmol/L (ref 98–111)
Creatinine, Ser: 0.61 mg/dL (ref 0.61–1.24)
GFR calc Af Amer: >60 mL/min (ref >60)
GFR calc non Af Amer: >60 mL/min (ref >60)
Glucose, Bld: 99 mg/dL (ref 70–99)
Potassium: 3.4 mmol/L — ABNORMAL LOW (ref 3.5–5.1)
Sodium: 138 mmol/L (ref 135–145)

## 2019-09-09 LAB — VANCOMYCIN, TROUGH: Vancomycin Tr: 21 ug/mL (ref 15–20)

## 2019-09-09 NOTE — Progress Notes (Signed)
Observed some bleeding from patients scalp wound. Area was cleaned and staple removed with minimal discomfort.

## 2019-09-09 NOTE — Progress Notes (Signed)
Pharmacy Antibiotic Note  Matthew Black is a 43 y.o. male transferred from acute to CIR on  09/06/2019.  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  Pharmacy has been consulted to continue dosing/monitoring cefepime and vancomycin for brain abscess/cerebritis.   Pt was started on vancomycin and cefepime on 08/20/19, with planned duration of 6-8 weeks per ID. On transfer to CIR, he was receiving cefepime 2 gm IV Q 8 hrs and vancomycin 1250 mg IV Q 8 hrs, with goal vancomycin trough level of 15-20 mg/L.   10/16 Vancomycin trough = 21  WBC 5.6, afebrile; Scr 0.6, CrCl > 100 ml/min (renal function stable)  Plan: Continue cefepime 2 gm IV Q 8 hrs  Continue vancomycin 1250 mg IV Q 8 hrs (goal vanc trough of 15-20 mg/L) Will target higher end of goal range given need for brain penetration. Monitor renal function, WBC, temp, vancomycin levels, cultures, clinical improvement  Height: 5\' 11"  (180.3 cm) Weight: 194 lb 0.1 oz (88 kg) IBW/kg (Calculated) : 75.3  Temp (24hrs), Avg:98.7 F (37.1 C), Min:98.1 F (36.7 C), Max:99 F (37.2 C)  Recent Labs  Lab 09/04/19 0555 09/05/19 0500 09/06/19 1329 09/07/19 0440 09/09/19 0511  WBC  --  5.5  --  5.6  --   CREATININE 0.60* 0.58*  --  0.61 0.61  VANCOTROUGH  --   --  19  --  21*    Estimated Creatinine Clearance: 129.4 mL/min (by C-G formula based on SCr of 0.61 mg/dL).    Allergies  Allergen Reactions  . Hydrocodone Nausea Only    Antimicrobials this admission: 9/21 Zosyn >> 9/26 9/26 Cefepime >> (11/20) 9/26  Vancomycin >> (11/20)  Microbiology results: 9/27 BCx X 2: NG/final 9/17  UCx: Pseudomonas aeruginosa (suscept to Zosyn) 9/14  Abdominal fluid: Gram stain: rate WBCs, NOS; cx: NG/final 9/27 Epidural abscess: Gram stain: mod WBCs (PMNs), NOS; cx: NG/final 9/27 MRSA PCR: positive  Thank you for allowing pharmacy to be a part of this patient's care.  [employer], Pharm.D., BCPS Clinical Pharmacist  **Pharmacist phone directory can now be found on [email or web address] ([email or web address]).  Listed under [place].  09/09/2019 8:13 AM

## 2019-09-09 NOTE — Progress Notes (Signed)
Physical Therapy Session Note  Patient Details  Name: Matthew Black MRN: [id number] Date of Birth: 1976/09/15  Today's Date: 09/09/2019 PT Individual Time: 0800-0900 PT Individual Time Calculation (min): 60 min   Short Term Goals: Week 1:  PT Short Term Goal 1 (Week 1): Pt will transfer bed<>chair w/ only +1 assist PT Short Term Goal 2 (Week 1): Pt will perform bed mobility w/ +1 assist PT Short Term Goal 3 (Week 1): Pt will tolerate 30 min of upright activity w/o increase in fatigue PT Short Term Goal 4 (Week 1): Pt will tolerate 60 min of sitting up in w/c in between therapy sessions  Skilled Therapeutic Interventions/Progress Updates:   Pt awake in supine, points to pain on R side of head, confirms it is a headache-type pain and cannot rate - made RN aware. Pt more agreeable to get OOB this AM. R/L rolling w/ total assist to change brief as he was incontinent of urine and to don maximove sling. Pt heavily resisting any R rolling, however would deny pain w/ R rolling either in abdomen at bone flap site or R side of head. Adjusted pt's TIS prior to transfer, bringing hard back up for greater thoracic support. Maximove transfer to [place]. Total assist +2 to reposition in TIS for pressure relief and upright tolerance. Total assist w/c transport to/from day room worked on self-feeding in fully upright position as well as cervical extensor strength w/ verbal and tactile cues to bring head to neutral cervical extension. Pt able to achieve for brief periods - 20 to 30 sec at a time, before returning to cervical flexion and R rotation at rest. Able to self-feed w/ supervision using RUE, verbal and auditory cues to attend to L environment and L visual field. Returned to room and ended session in TIS, all needs in reach. Helmet donned.    Pt very minimally engaging w/ this therapist. Responded only in head nods to simple yes/no questions ~50% of the time.   Therapy Documentation Precautions:   Precautions Precautions: Fall, Other (comment) Precaution Comments: crani helmet when OOB Required Braces or Orthoses: Other Brace Restrictions Weight Bearing Restrictions: No  Therapy/Group: Individual Therapy  Ayaka Andes K Cheral Cappucci 09/09/2019, 9:20 AM

## 2019-09-09 NOTE — Progress Notes (Signed)
Speech Language Pathology Daily Session Note  Patient Details  Name: Matthew Black MRN: [id number] Date of Birth: 05/19/1976  Today's Date: 09/09/2019  Session 1: SLP Individual Time: 1130-1200 SLP Individual Time Calculation (min): 30 min   Session 2: SLP Individual Time: [id number] SLP Individual Time Calculation (min): 25 min  Short Term Goals: Week 1: SLP Short Term Goal 1 (Week 1): Patient will consume trials of thin liquids without overt s/s of aspiration and Min verbal cues over 2 sessions to assess readiness for repeat MBS. SLP Short Term Goal 2 (Week 1): Patient will demonstrate efficient mastication with complete oral clearance with trials of Dys. 3 textures with Min verbal cues over 2 sessions prior to upgrade. SLP Short Term Goal 3 (Week 1): Patient will vocalize on command in 25% of opportunities with Max A multimodal cues. SLP Short Term Goal 4 (Week 1): Patient will demonstrate sustained attention to tasks for ~5 minutes with Max verbal cues for redirection. SLP Short Term Goal 5 (Week 1): Patient will scan to midline/left field of enviornment ot locate functional items with Max A multimodal cues. SLP Short Term Goal 6 (Week 1): Patient will demonstrate basic problem solving with Max A multimodal cues during functional tasks.  Skilled Therapeutic Interventions:  Session 1: Skilled treatment session focused on cognitive goals. Upon arrival, patient was asleep and required extra time and Max verbal and tactile cues for arousal. Despite Max encouragement, patient did not vocalize or verbalize and utilized head nods for basic communication. SLP also facilitated session by providing Max A verbal cues for sustained attention, attention to left field of enviornment and problem solving during a basic coin sorting task. Patient left upright in bed with alarm on and all needs within reach. Continue with current plan of care.   Session 2: Skilled treatment session focused on dysphagia and  communication goals. Upon arrival, patient was awake while eating his lunch meal meal of Dys. 2 textures with nectar-thick liquids. Patient consumed meal with overt cough X 1, suspect due to decreased attention to task. Patient required Mod-Max A verbal and visual cues for initiation of self-feeding and attention to task and did not verbalize despite Max encouragement/multimodal cues. Patient utilzied gestures and head nods for communication. Patient left upright in bed with all needs within reach and alarm on. Continue with current plan of care.    Pain No/Denies Pain   Therapy/Group: Individual Therapy  Genevia Bouldin 09/09/2019, 3:24 PM

## 2019-09-09 NOTE — Progress Notes (Signed)
Occupational Therapy Session Note  Patient Details  Name: Matthew Black MRN: [id number] Date of Birth: May 26, 1976  Today's Date: 09/09/2019 OT Individual Time: 6606-0045 OT Individual Time Calculation (min): 75 min   Short Term Goals: Week 1:  OT Short Term Goal 1 (Week 1): Pt will tolerate sitting EOB for 5 minutes within BADL task. OT Short Term Goal 2 (Week 1): Pt will maintain sitting balance with mod A of 1 person OT Short Term Goal 3 (Week 1): Pt will complete UB dressing with max A of 1 OT Short Term Goal 4 (Week 1): Pt will locate 2/3 grooming items on the L side of the sink.  Skilled Therapeutic Interventions/Progress Updates:    Pt greeted in TIS wc with nursing present. OT noted that there was a large pool of urine was leaking from under chair and a few feet in front of chair. Pt began trying to doff brief and gestured like he needed to go to the bathroom. OT placed urinal, but he was unable to go more. Pt pointed towards bathroom but pt does not have the head/neck and trunk control to sit on BSC yet. Pt completed sit<>stand with Stedy with max/total A +2 with severe push to the L and unable to bring head/neck up. Skilled stedy transfer back to bed as pt was pushing very hard on L +2 for stedy transfer. Had pt lean down onto R elbow when sitting EOB to decrease pushing. Total A +2 to return to bed. Worked on bed mobility with rolling L and R with max/total A with noted smear of BM on chuck. Pt resistive to rolling with increased tone in L side. Worked on pt turning head towards where he is turning in bed. Bed pan placed, but pt unable to void after ample time and in quiet environment. Pt did have smear of BM when OT cleaning pt up with total A. Total A + 2 for brief change. Pt was able to assist with washing between legs when directed by OT. Worked on L attention to locate items with mod verbal cues. Tried to get pt to name color of t shirt, but unable to verbalize. OT tried to encourage  pt to communicate with voice throughout session, but was non verbal throughout. OT noticed bloody drainage from incision site on head and R mit placed to keep pt from touching incision site. Nursing notified and removed staple from head. OT then provided gentle ROM for NMR to L UE. Pt with increased flexor tone with L hand in a fist. Pt also with L shoulder subluxation. OT ranged shoulder, elbow, wrist, hand, and placed L rest hand splint and pillow placed for L UE support. Pt left supine in bed with bed alarm on and needs met.  Therapy Documentation Precautions:  Precautions Precautions: Fall, Other (comment) Precaution Comments: crani helmet when OOB Required Braces or Orthoses: Other Brace Restrictions Weight Bearing Restrictions: No' \Pain' :   no pain  Therapy/Group: Individual Therapy  Valma Cava 09/09/2019, 9:57 AM

## 2019-09-09 NOTE — IPOC Note (Signed)
Overall Plan of Care [facility]) Patient Details Name: Matthew Black MRN: [id number] DOB: 1976-09-19  Admitting Diagnosis: Right middle cerebral artery stroke [facility])  Hospital Problems: Principal Problem:   Right middle cerebral artery stroke [facility])     Functional Problem List: Nursing Bladder, Bowel, Endurance, Medication Management, Safety, Skin Integrity, Pain, Nutrition  PT Balance, Behavior, Endurance, Motor, Perception, Safety  OT Balance, Behavior, Cognition, Endurance, Motor, Perception, Safety, Sensory  SLP Cognition, Linguistic  TR         Basic ADL's: OT Eating, Grooming, Bathing, Toileting, Dressing     Advanced  ADL's: OT       Transfers: PT Bed Mobility, Bed to Chair, Car, [employer], Futures trader, Tub/Shower     Locomotion: PT Stairs, Emergency planning/management officer, Ambulation     Additional Impairments: OT Fuctional Use of Upper Extremity  SLP Swallowing, Communication, Social Cognition expression, comprehension Social Interaction, Problem Solving, Memory, Attention, Awareness  TR      Anticipated Outcomes Item Anticipated Outcome  Self Feeding Min A  Swallowing  Supervision   Basic self-care  Mod A  Toileting  Mod A   Bathroom Transfers Mod A  Bowel/Bladder  Mod assist to toilet or BSC  Transfers  mod assist  Locomotion  min assist w/c propulsion  Communication  Mod A  Cognition  Min A  Pain  <3 on a 0-10 pain scale  Safety/Judgment  Mod assist   Therapy Plan: PT Intensity: Minimum of 1-2 x/day ,45 to 90 minutes PT Frequency: 5 out of 7 days PT Duration Estimated Length of Stay: 24-28 days OT Intensity: Minimum of 1-2 x/day, 45 to 90 minutes OT Frequency: 5 out of 7 days OT Duration/Estimated Length of Stay: 4 weeks SLP Intensity: Minumum of 1-2 x/day, 30 to 90 minutes SLP Frequency: 3 to 5 out of 7 days SLP Duration/Estimated Length of Stay: 4 weeks   Due to the current state of emergency, patients may not be receiving their 3-hours of  Medicare-mandated therapy.   Team Interventions: Nursing Interventions Patient/Family Education, Bladder Management, Bowel Management, Cognitive Remediation/Compensation, Dysphagia/Aspiration Precaution Training, Disease Management/Prevention, Discharge Planning, Pain Management, Medication Management, Psychosocial Support  PT interventions Ambulation/gait training, Community reintegration, DME/adaptive equipment instruction, Neuromuscular re-education, UE/LE Strength taining/ROM, Stair training, Psychosocial support, Wheelchair propulsion/positioning, UE/LE Coordination activities, Therapeutic Activities, Skin care/wound management, Pain management, Functional electrical stimulation, Discharge planning, Balance/vestibular training, Cognitive remediation/compensation, Disease management/prevention, Functional mobility training, Patient/family education, Therapeutic Exercise, Splinting/orthotics, Visual/perceptual remediation/compensation  OT Interventions Balance/vestibular training, Cognitive remediation/compensation, Community reintegration, Discharge planning, Disease mangement/prevention, DME/adaptive equipment instruction, Functional electrical stimulation, Functional mobility training, Neuromuscular re-education, Patient/family education, Psychosocial support, Self Care/advanced ADL retraining, Splinting/orthotics, Therapeutic Activities, Therapeutic Exercise, UE/LE Strength taining/ROM, UE/LE Coordination activities, Wheelchair propulsion/positioning, Visual/perceptual remediation/compensation  SLP Interventions Cognitive remediation/compensation, Dysphagia/aspiration precaution training, Internal/external aids, Speech/Language facilitation, Therapeutic Activities, Cueing hierarchy, Environmental controls, Functional tasks, Patient/family education  TR Interventions    SW/CM Interventions Discharge Planning, Psychosocial Support, Patient/Family Education   Barriers to Discharge MD  Medical  stability  Nursing Decreased caregiver support, Medical stability, Home environment access/layout, Incontinence, Inaccessible home environment, Lack of/limited family support, Medication compliance    PT Inaccessible home environment, Medical stability, Incontinence, Insurance for SNF coverage, Behavior    OT      SLP      SW       Team Discharge Planning: Destination: PT-Home ,OT- Home , SLP-Home Projected Follow-up: PT-Home health PT, OT-  Home health OT, SLP-Home Health SLP, 24 hour supervision/assistance Projected Equipment Needs:  PT-To be determined, OT- To be determined, SLP-To be determined Equipment Details: PT- , OT-  Patient/family involved in discharge planning: PT- Patient, Family member/caregiver,  OT-Patient unable/family or caregiver not available, SLP-Patient  MD ELOS: 28 days Medical Rehab Prognosis:  Good Assessment: The patient has been admitted for CIR therapies with the diagnosis of right MCA infarct with hydrocephalus. The team will be addressing functional mobility, strength, stamina, balance, safety, adaptive techniques and equipment, self-care, bowel and bladder mgt, patient and caregiver education, NMR, language, cognition, visual-spatial awareness. Goals have been set at [facility] assist for self-care and transfers, min assist w/c mobility and min assist for cognition, mod assist for communication.   Due to the current state of emergency, patients may not be receiving their 3 hours per day of Medicare-mandated therapy.    Meredith Staggers, MD, FAAPMR      See Team Conference Notes for weekly updates to the plan of care

## 2019-09-09 NOTE — Progress Notes (Signed)
[place] PHYSICAL MEDICINE & REHABILITATION PROGRESS NOTE   Subjective/Complaints: Up in chair with therapy. Indicates that he's having an ongoing headache. Staff has noted some intermittent drainage from his scalp incision  ROS: limited due to language/communication    Objective:   No results found. Recent Labs    09/07/19 0440  WBC 5.6  HGB 11.9*  HCT 35.7*  PLT 242   Recent Labs    09/07/19 0440 09/09/19 0511  NA 139 138  K 3.4* 3.4*  CL 105 103  CO2 24 25  GLUCOSE 104* 99  BUN 8 8  CREATININE 0.61 0.61  CALCIUM 9.3 9.3    Intake/Output Summary (Last 24 hours) at 09/09/2019 1003 Last data filed at 09/09/2019 0900 Gross per 24 hour  Intake 1314.02 ml  Output 350 ml  Net 964.02 ml     Physical Exam: Vital Signs Blood pressure (!) 130/91, pulse 83, temperature 98.9 F (37.2 C), resp. rate 18, height 5\' 11"  (1.803 m), weight 88 kg, SpO2 100 %. Constitutional: No distress . Vital signs reviewed. HEENT: EOMI, oral membranes moist Neck: supple Cardiovascular: RRR without murmur. No JVD    Respiratory: CTA Bilaterally without wheezes or rales. Normal effort    GI: BS +, non-tender, non-distended   Musculoskeletal:Normal range of motion.  Neuro:  Left central 7. Does not speak but follows commands fairly promptly and communicates with head nods.  Dense left hemiparesis. 0/5 LUE and LLE. At least 3-4/5 RUE and RLE. Senses pain on right side, minimal withdrawal on left. DTR's 3+ LUE, LLE, toes up.flexor tone LLE 1/4 Skin: Skin iswarmand dry. Line RUE Psychiatric: Flat, cooperative    Assessment/Plan: 1. 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  Physiatrist is providing close team supervision and 24 hour management of active medical problems listed below.  Physiatrist and rehab team continue to assess barriers to discharge/monitor  patient progress toward functional and medical goals  Care Tool:  Bathing    Body parts bathed by patient: Chest, Abdomen, Right upper leg, Left arm, Face, Front perineal area   Body parts bathed by helper: Right arm, Left arm, Chest, Abdomen, Front perineal area, Buttocks, Right upper leg, Left upper leg, Right lower leg, Left lower leg     Bathing assist Assist Level: Maximal Assistance - Patient 24 - 49%     Upper Body Dressing/Undressing Upper body dressing   What is the patient wearing?: Hospital gown only    Upper body assist Assist Level: Maximal Assistance - Patient 25 - 49%    Lower Body Dressing/Undressing Lower body dressing      What is the patient wearing?: Incontinence brief     Lower body assist Assist for lower body dressing: Maximal Assistance - Patient 25 - 49%     Toileting Toileting Toileting Activity did not occur (Clothing management and hygiene only): N/A (no void or bm)  Toileting assist Assist for toileting: Maximal Assistance - Patient 25 - 49%     Transfers Chair/bed transfer  Transfers assist     Chair/bed transfer assist level: Dependent - mechanical lift     Locomotion Ambulation   Ambulation assist   Ambulation activity did not occur: Safety/medical concerns          Walk 10 feet activity   Assist  Walk 10 feet activity did not occur: Safety/medical concerns        Walk 50 feet activity  Assist Walk 50 feet with 2 turns activity did not occur: Safety/medical concerns         Walk 150 feet activity   Assist Walk 150 feet activity did not occur: Safety/medical concerns         Walk 10 feet on uneven surface  activity   Assist Walk 10 feet on uneven surfaces activity did not occur: Safety/medical concerns         Wheelchair     Assist Will patient use wheelchair at discharge?: Yes Type of Wheelchair: Manual Wheelchair activity did not occur: Safety/medical concerns          Wheelchair 50 feet with 2 turns activity    Assist    Wheelchair 50 feet with 2 turns activity did not occur: Safety/medical concerns       Wheelchair 150 feet activity     Assist  Wheelchair 150 feet activity did not occur: Safety/medical concerns       Blood pressure (!) 130/91, pulse 83, temperature 98.9 F (37.2 C), resp. rate 18, height 5\' 11"  (1.803 m), weight 88 kg, SpO2 100 %. Medical Problem List and Plan: 1.Left-sided weakness/dysphagiasecondary to right MCA/ACA infarction withR ICA,R MCAocclusion with cerebral edema status post hemicraniectomy with abdominal flap implant [id number] complicated bycerebral abscess/empyema hemorrhagic conversion -Continue CIR therapies including PT, OT, and SLP  -ELOS 3-4 weeks, mod assist goals  -PRAFO, WHO Left  -still non-verbal mostly but intermittently will speak a few words, short phrases  -wear helmet when up with therapies 2. Antithrombotics: -DVT/anticoagulation:Right peroneal, left popliteal, left posterior tibial and left peroneal DVT. Status post IVC filter 07/21/2019 with plan for retrieval 8 to 12 weeks. Patient currently remains on subcutaneous heparin. No plan for long-term anticoagulation -antiplatelet therapy: N/A 3. Pain Management:Tramadol as needed 4. Mood:Provide motional support -antipsychotic agents: N/A 5. Neuropsych: This patientis notcapable of making decisions on hisown behalf. 6. Skin/Wound Care:Routine skin checks 7. Fluids/Electrolytes/Nutrition:Routine in and outs.   -replete mild K+ deficit  -mild elevation of LFT's, increased from 10/11---follow up  Monday  -intake inconsistent, mostly cognitive   -check prealbumin Monday  -see #8 8. Dysphagia. Dysphagia #2withnectar liquids. Follow-up speech therapy -needs cueing/assistance with meals 9. Seizure prophylaxis. Keppra 500 mg twice daily 10.  Cerebral abscess/empyema/cerebritis. Cultures no growth to date. Continue vancomycin and cefepime 08/20/2019 x 6 to 8 weeks per infectious disease.  -wbc normal, afebrile 11. Pseudomonas UTI. Zosyn completed 12. Hypertension with tachycardia. Lopressor 50 mg every 8 hours.  -dbp remains a bit elevated---no med changes at present 13. History of tobacco marijuana use. Urine drug screen positive marijuana. Provide counseling with family      LOS: 3 days A FACE TO FACE EVALUATION WAS PERFORMED  Meredith Staggers 09/09/2019, 10:03 AM

## 2019-09-09 NOTE — Progress Notes (Addendum)
Social Work Assessment and Plan   Patient Details  Name: Matthew Black MRN: [id number] Date of Birth: 1976/04/27  Today's Date: 09/09/2019  Problem List:  Patient Active Problem List   Diagnosis Date Noted  . Sleep disturbance   . Dysphagia, post-stroke   . Transaminitis   . 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 Cerebral abscess   . Urinary tract infection without hematuria   . Altered mental status   . Primary hypercoagulable state ([place])   . Acute pulmonary embolism without acute cor pulmonale (HCC)   . Deep vein thrombosis (DVT) of non-extremity vein   . Hypokalemia   . FUO (fever of unknown origin)   . Acute blood loss anemia   . SIRS (systemic inflammatory response syndrome) (HCC)   . Leukocytosis   . Endotracheal tube present   . Acute respiratory failure with hypoxemia ([place])   . Stroke (cerebrum) ([place]) 07/19/2019  . Pressure injury of skin 07/19/2019  . Acute CVA (cerebrovascular accident) ([place])   . Encephalopathy   . Dysphagia   . Acute encephalopathy   . Essential hypertension   . Annual physical exam 09/11/2016  . Obesity 03/12/2016  . PCP NOTES >>>>>>>>>>>>>>>>>. 03/12/2016  . Scalp abscess 12/20/2015  . Neck abscess 12/20/2015  . Pilonidal cyst 02/08/2013   Past Medical History:  Past Medical History:  Diagnosis Date  . 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 CRANIOTOMY Right 07/19/2019   Procedure: RIGHT HEMI-CRANIECTOMY With implantation of skull flap to abdominal wall;  Surgeon: Consuella Lose, MD;  Location: [facility];  Service: Neurosurgery;  Laterality: Right;  . CYST EXCISION N/A 10/08/2016   Procedure: EXCISION OF POSTERIOR NECK CYST;  Surgeon: Clovis Riley, MD;  Location: [facility];  Service: General;  Laterality: N/A;  . INCISION AND DRAINAGE ABSCESS N/A 09/22/2014   Procedure: INCISION AND DRAINAGE ABSCESS POSTERIOR NECK;  Surgeon: Pedro Earls, MD;   Location: [facility];  Service: General;  Laterality: N/A;  . INCISION AND DRAINAGE ABSCESS N/A 12/20/2015   Procedure: INCISION AND DRAINAGE POSTERIOR NECK MASS;  Surgeon: Armandina Gemma, MD;  Location: [facility];  Service: General;  Laterality: N/A;  . INCISION AND DRAINAGE ABSCESS Left 07/10/2004   middle finger  . IR IVC FILTER PLMT / S&I /IMG GUID/MOD SED  07/21/2019  . IR VENOGRAM RENAL UNI RIGHT  07/21/2019  . MASS EXCISION N/A 07/21/2017   Procedure: EXCISION OF BENIGN NECK LESION WITH LAYERED CLOSURE;  Surgeon: Irene Limbo, MD;  Location: [facility];  Service: Plastics;  Laterality: N/A;  . MASS EXCISION N/A 11/10/2017   Procedure: EXCISION BENIGN LESION OF THE NECK WITH LAYERED CLOSURE;  Surgeon: Irene Limbo, MD;  Location: [facility];  Service: Plastics;  Laterality: N/A;   Social History:  reports that he quit smoking about 3 years ago. He smoked 0.00 packs per day. He has never used smokeless tobacco. He reports current alcohol use. He reports that he does not use drugs.  Family / Support Systems Marital Status: Married How Long?: 5 yrs ("together for 16" per wife) Patient Roles: Spouse, Parent Spouse/Significant Other: wife, Alease Medina @ [phone] Children: 41 yo and 16 yo children in the home. Anticipated Caregiver: wife, his Mom and stepdad as well as wife's parents Ability/Limitations of Caregiver: wife unemployed; other paretns and in laws work but they will rearrange schedules to assist with wife in his care per  wife Caregiver Availability: 24/7 Family Dynamics: Wife notes that they both have large family groups livning in the area and are all very supportive.  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 Legal  History/Current Legal Issues: None Guardian/Conservator: None - per MD, pt is not capable of making decisions on his own behalf - defer to spouse.   Abuse/Neglect Abuse/Neglect Assessment Can Be Completed: Yes Physical Abuse: Denies Verbal Abuse: Denies Sexual Abuse: Denies Exploitation of patient/patient's resources: Denies Self-Neglect: Denies  Emotional Status Pt's affect, behavior and adjustment status: Pt sitting up in w/c and is non-verbal still at this time.  He does reach out to shake my hand and nods "yes" or "no" appropriately.  Does not appear to be in any emotional distress.  Wife notes she has not witnessed any expression of upset or down mood, however, will monitor closely.  Anticipate involving neuropsychology as his communication improves. Recent Psychosocial Issues: None Psychiatric History: None Substance Abuse History: None  Patient / Family Perceptions, Expectations & Goals Pt/Family understanding of illness & functional limitations: Unable to assess pt's understanding due to non-verbal.  Wife has good, general understanding of his stroke and other medical issues that have taken place as well as his current functional limitations/ need for CIR. 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 Anticipated changes in roles/activities/participation: Per goals, team anticipates w/c level goals.  Wife to provide primary caregiver support role. Pt/family expectations/goals: "I just want him to get as much back as he can."  (per wife)  [employer]: None Premorbid Home Care/DME Agencies: None Transportation available at discharge: yes Resource referrals recommended: Neuropsychology, Support group (specify)  Discharge Planning Living Arrangements: Spouse/significant other, Children Support Systems: Spouse/significant other, Children, Parent, Other relatives, Friends/neighbors Type  of Residence: Private residence Insurance Resources: [employer] (specify county) Pensions consultant: Employment Museum/gallery curator Screen Referred: Previously completed Living Expenses: Education officer, community Management: Spouse, Patient Does the patient have any problems obtaining your medications?: No Home Management: Pt and wife share responsibilities Patient/Family Preliminary Plans: Pt to d/c home with wife as primary caregiver.  Additional support from extended family. Social Work Anticipated Follow Up Needs: HH/OP Expected length of stay: [facility] 3 to 4 weeks if making progress; otherwise if he plateaus, wife aware that he will d/c home sooner  Clinical Impression Unfortunate gentleman here following CVA with craniectomy and on CIR now with severe deficits.  Goals anticipated to be mod assist w/c level.  Wife very involved and supportive and prepared to provide 24/7 assistance. She does anticipate assistance from extended family and her teen-aged children.  Pt currently non-verbal but does engage with gestures.  Will involve neuropsychology when communication ability allows.  Leiam Hopwood 09/09/2019, 3:53 PM

## 2019-09-10 ENCOUNTER — Inpatient Hospital Stay (HOSPITAL_COMMUNITY): Payer: Medicaid Other | Admitting: Occupational Therapy

## 2019-09-10 ENCOUNTER — Inpatient Hospital Stay (HOSPITAL_COMMUNITY): Payer: Medicaid Other

## 2019-09-10 ENCOUNTER — Inpatient Hospital Stay (HOSPITAL_COMMUNITY): Payer: Medicaid Other | Admitting: Speech Pathology

## 2019-09-10 DIAGNOSIS — I69391 Dysphagia following cerebral infarction: Secondary | ICD-10-CM

## 2019-09-10 DIAGNOSIS — E876 Hypokalemia: Secondary | ICD-10-CM

## 2019-09-10 DIAGNOSIS — R7401 Elevation of levels of liver transaminase levels: Secondary | ICD-10-CM

## 2019-09-10 DIAGNOSIS — G060 Intracranial abscess and granuloma: Secondary | ICD-10-CM

## 2019-09-10 MED ORDER — ALTEPLASE 2 MG IJ SOLR: 2.0000 mg | Freq: Once | INTRAMUSCULAR | Status: DC

## 2019-09-10 MED ORDER — ALTEPLASE 2 MG IJ SOLR
2.0000 mg | Freq: Once | INTRAMUSCULAR | Status: AC
  Administered 2019-09-10: 2 mg
  Filled 2019-09-10: qty 2

## 2019-09-10 NOTE — Progress Notes (Signed)
Per IV team, orders placed for Cathflo for 2 clogged lumens.

## 2019-09-10 NOTE — Progress Notes (Signed)
Occupational Therapy Session Note  Patient Details  Name: Matthew Black MRN: [id number] Date of Birth: November 01, 1976  Today's Date: 09/10/2019 OT Individual Time: [id number] OT Individual Time Calculation (min): 58 min   Short Term Goals: Week 1:  OT Short Term Goal 1 (Week 1): Pt will tolerate sitting EOB for 5 minutes within BADL task. OT Short Term Goal 2 (Week 1): Pt will maintain sitting balance with mod A of 1 person OT Short Term Goal 3 (Week 1): Pt will complete UB dressing with max A of 1 OT Short Term Goal 4 (Week 1): Pt will locate 2/3 grooming items on the L side of the sink.  Skilled Therapeutic Interventions/Progress Updates:    Pt greeted in bed with NT present. In the middle of breakfast and being fed by NT. OT resumed supervisory role, having pt feed himself with regular utensils. Tx focus placed on initiation, communicating preferences via nods/head shakes, and scanning for plate/beverages placed on Lt of midline using head turns. Vcs for pocketing per swallowing strategies. 2 assist were required for boosting pt up in bed and placing in chair position beforehand. He next completed bathing and dressing tasks, still in chair position to work on upright tolerance and trunk control. Note that he often assisted OT with correcting Rt lean using the bedrail when given cues. He did need Max A to obtain upright trunk though. HOH for washing affected UE and LE due to neglect. Placed each LE into figure 4 position during LB bathing which he tolerated well. Rolling Rt>Lt completed with Max A with heavy tactile and manual cuing for initiation during hygiene and brief change. Noted windswept positioning of LEs, difficult to offset. At end of session placed a pillow between his legs for skin integrity. He remained in bed at end of session with all needs within reach, hand mitt donned, bed alarm set, and call bell.       Therapy Documentation Precautions:  Precautions Precautions: Fall, Other  (comment) Precaution Comments: crani helmet when OOB Required Braces or Orthoses: Other Brace Restrictions Weight Bearing Restrictions: No Pain: No s/s pain during tx   ADL: ADL Eating: Dependent Grooming: Maximal assistance Upper Body Bathing: Maximal assistance Lower Body Bathing: Dependent Upper Body Dressing: Maximal assistance Lower Body Dressing: Dependent Toileting: Dependent Toilet Transfer: (safety/medical ) Tub/Shower Transfer: (safety/medical)      Therapy/Group: Individual Therapy  Demi Trieu A Mylie Mccurley 09/10/2019, 12:57 PM

## 2019-09-10 NOTE — Progress Notes (Signed)
[place] PHYSICAL MEDICINE & REHABILITATION PROGRESS NOTE   Subjective/Complaints: Patient seen sitting up in bed this morning.  No reported issues overnight.  ROS: limited due to nonverbal    Objective:   No results found. No results for input(s): WBC, HGB, HCT, PLT in the last 72 hours. Recent Labs    09/09/19 0511  NA 138  K 3.4*  CL 103  CO2 25  GLUCOSE 99  BUN 8  CREATININE 0.61  CALCIUM 9.3    Intake/Output Summary (Last 24 hours) at 09/10/2019 1318 Last data filed at 09/10/2019 1248 Gross per 24 hour  Intake 1641.23 ml  Output -  Net 1641.23 ml     Physical Exam: Vital Signs Blood pressure 119/86, pulse 76, temperature 98.7 F (37.1 C), temperature source Oral, resp. rate 19, height 5\' 11"  (1.803 m), weight 88 kg, SpO2 100 %. Constitutional: No distress . Vital signs reviewed. HENT: Craniectomy site C/D/I Eyes: EOMI. No discharge. Cardiovascular: No JVD. Respiratory: Normal effort.  No stridor. GI: Non-distended. Skin: See above. Psych: Unable to assess due to cognition Musc: No edema in extremities.  No tenderness in extremities. Neuro: Alert Left facial weakness  Left neglect Left hemiplegia  RUE/RLE appear to be 5/5 proximal distal   Assessment/Plan: 1. 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  Physiatrist is providing close team supervision and 24 hour management of active medical problems listed below.  Physiatrist and rehab team continue to assess barriers to discharge/monitor patient progress toward functional and medical goals  Care Tool:  Bathing    Body parts bathed by patient: Right upper leg, Face, Front perineal area, Chest   Body parts bathed by helper: Right arm, Left arm, Abdomen, Buttocks, Left upper leg, Right lower leg, Left lower leg, Face     Bathing assist Assist Level: Total Assistance - Patient <  25%     Upper Body Dressing/Undressing Upper body dressing   What is the patient wearing?: Hospital gown only    Upper body assist Assist Level: Dependent - Patient 0%    Lower Body Dressing/Undressing Lower body dressing      What is the patient wearing?: Incontinence brief     Lower body assist Assist for lower body dressing: Total Assistance - Patient < 25%     Toileting Toileting Toileting Activity did not occur Landscape architect and hygiene only): N/A (no void or bm)  Toileting assist Assist for toileting: 2 Helpers     Transfers Chair/bed transfer  Transfers assist     Chair/bed transfer assist level: 2 Helpers     Locomotion Ambulation   Ambulation assist   Ambulation activity did not occur: Safety/medical concerns          Walk 10 feet activity   Assist  Walk 10 feet activity did not occur: Safety/medical concerns        Walk 50 feet activity   Assist Walk 50 feet with 2 turns activity did not occur: Safety/medical concerns         Walk 150 feet activity   Assist Walk 150 feet activity did not occur: Safety/medical concerns         Walk 10 feet on uneven surface  activity   Assist Walk 10 feet on uneven surfaces activity did not occur: Safety/medical concerns         Wheelchair     Assist Will patient use wheelchair at discharge?:  Yes Type of Wheelchair: Manual Wheelchair activity did not occur: Safety/medical concerns         Wheelchair 50 feet with 2 turns activity    Assist    Wheelchair 50 feet with 2 turns activity did not occur: Safety/medical concerns       Wheelchair 150 feet activity     Assist  Wheelchair 150 feet activity did not occur: Safety/medical concerns       Blood pressure 119/86, pulse 76, temperature 98.7 F (37.1 C), temperature source Oral, resp. rate 19, height 5\' 11"  (1.803 m), weight 88 kg, SpO2 100 %. Medical Problem List and Plan: 1.Left-sided  weakness/dysphagiasecondary to right MCA/ACA infarction withR ICA,R MCAocclusion with cerebral edema status post hemicraniectomy with abdominal flap implant 123XX123 complicated bycerebral abscess/empyema hemorrhagic conversion Continue CIR  PRAFO, WHO Left  -wear helmet when up with therapies 2. Antithrombotics: -DVT/anticoagulation:Right peroneal, left popliteal, left posterior tibial and left peroneal DVT. Status post IVC filter 07/21/2019 with plan for retrieval 8 to 12 weeks. Patient currently remains on subcutaneous heparin. No plan for long-term anticoagulation -antiplatelet therapy: N/A 3. Pain Management:Tramadol as needed 4. Mood:Provide motional support -antipsychotic agents: N/A 5. Neuropsych: This patientis notcapable of making decisions on hisown behalf. 6. Skin/Wound Care:Routine skin checks 7. Fluids/Electrolytes/Nutrition:Routine in and outs.  Hypokalemia-potassium 3.4 on 10/16, continue supplementation, labs ordered for Monday  -mild transaminitis-labs ordered for Monday  -check prealbumin Monday  -see #8 8. Post stroke dysphagia. Dysphagia #2withnectar liquids. Follow-up speech therapy -needs cueing/assistance with meals 9. Seizure prophylaxis. Keppra 500 mg twice daily 10. Cerebral abscess/empyema/cerebritis. Cultures no growth to date. Continue vancomycin and cefepime 08/20/2019 x 6 to 8 weeks per infectious disease.  Vanc trough on 10/16 elevated 11. Pseudomonas UTI. Zosyn completed 12. Hypertension with tachycardia. Lopressor 50 mg every 8 hours.  Controlled on 10/17 13. History of tobacco marijuana use. Urine drug screen positive marijuana. Provide counseling with family     LOS: 4 days A FACE TO FACE EVALUATION WAS PERFORMED  Giang Hemme Lorie Phenix 09/10/2019, 1:18 PM

## 2019-09-10 NOTE — Progress Notes (Signed)
Physical Therapy Session Note  Patient Details  Name: Matthew Black MRN: [id number] Date of Birth: Apr 05, 1976  Today's Date: 09/10/2019 PT Individual Time: 1002-1102 PT Individual Time Calculation (min): 60 min   Short Term Goals: Week 1:  PT Short Term Goal 1 (Week 1): Pt will transfer bed<>chair w/ only +1 assist PT Short Term Goal 2 (Week 1): Pt will perform bed mobility w/ +1 assist PT Short Term Goal 3 (Week 1): Pt will tolerate 30 min of upright activity w/o increase in fatigue PT Short Term Goal 4 (Week 1): Pt will tolerate 60 min of sitting up in w/c in between therapy sessions  Skilled Therapeutic Interventions/Progress Updates:    Pt supine in bed upon PT arrival, agreeable to therapy tx and denies pain. Pt noted to be incontinent of bladder. Pt performed rolling to L sidelying with mod assist and rolling>R sidelying with max assist, cues for techniques and placement, therapist and second helper assisted with clothing management and pericare total assist, donned clean brief. Pt transferred supine>R sidelying max assist and from sidelying>sitting max assist, cues for techniques and facilitation for wieghtshifting. Upon sitting pt with initial L lateral lean requiring max assist for sitting balance, pt transferred to R sidelying on elbow to over correct L lateral lean and minimize pushing. Donned clean gown. Pt performed slideboard transfer to w/c with max +2 assist and transported to the gym. Lateral scoot from w/c<>mat this session max+2 assist for both directions with cues for sequencing and manual facilitation for weightshifting. Pt seated edge of mat working on seated balance and postural control using mirror for visual feedback, therapist providing facilitation for increased trunk and cervical extension, working on active assisted cervical extension. Pt seated edge of mat worked on R lateral weightshift with reaching activity x 2 trials, min assist for balance. Pt worked on R lateral  lean<>return to midline x 10 while sitting with cue to touch his R shoulder to techs shoulder. Pt performed x 1 sit<>stand from edge of mat this session 3 musketeers with max +2 assist, in standing pt able to maintain midline with cues to increase R lean, manual facilitation to increase hip extension and active assist for increased cervical extension. Pt returned to room at end of session and left in w/c with needs in reach and chair alarm set.   Therapy Documentation Precautions:  Precautions Precautions: Fall, Other (comment) Precaution Comments: crani helmet when OOB Required Braces or Orthoses: Other Brace Restrictions Weight Bearing Restrictions: No    Therapy/Group: Individual Therapy  Netta Corrigan, PT, DPT, CSRS 09/10/2019, 7:25 AM

## 2019-09-10 NOTE — Progress Notes (Signed)
Speech Language Pathology Daily Session Note  Patient Details  Name: Matthew Black MRN: [id number] Date of Birth: [birth year]  Today's Date: 09/10/2019 SLP Individual Time: 1300-1355 SLP Individual Time Calculation (min): 55 min  Short Term Goals: Week 1: SLP Short Term Goal 1 (Week 1): Patient will consume trials of thin liquids without overt s/s of aspiration and Min verbal cues over 2 sessions to assess readiness for repeat MBS. SLP Short Term Goal 2 (Week 1): Patient will demonstrate efficient mastication with complete oral clearance with trials of Dys. 3 textures with Min verbal cues over 2 sessions prior to upgrade. SLP Short Term Goal 3 (Week 1): Patient will vocalize on command in 25% of opportunities with Max A multimodal cues. SLP Short Term Goal 4 (Week 1): Patient will demonstrate sustained attention to tasks for ~5 minutes with Max verbal cues for redirection. SLP Short Term Goal 5 (Week 1): Patient will scan to midline/left field of enviornment ot locate functional items with Max A multimodal cues. SLP Short Term Goal 6 (Week 1): Patient will demonstrate basic problem solving with Max A multimodal cues during functional tasks.  Skilled Therapeutic Interventions: Skilled treatment session focused on communication and dysphagia goals. SLP facilitated session by providing Min A verbal cues for patient to self-monitor and correct errors while sorting change from a field of 4. Patient generated specific amounts of change and utilized change to generate answers for basic math problems while utilizing coins. Despite Max encouragement, patient did not vocalize throughout the session and utilized gestures. Patient did mouth words on 3 separate opportunities but SLP unable to understand due to low vocal intensity and what appeared to be an increased speech rate. Patient performed oral care via the suction toothbrush with Min verbal cues and consumed trials of ice chips. Patient with minimal oral  manipulation but no overt s/s of aspiration noted. Recommend continued trials with SLP. Patient left upright in wheelchair with wife present and alarm on. Continue with current plan of care.      Pain No/Denies Pain   Therapy/Group: Individual Therapy  Matthew Black 09/10/2019, 2:32 PM

## 2019-09-11 DIAGNOSIS — G479 Sleep disorder, unspecified: Secondary | ICD-10-CM

## 2019-09-11 NOTE — Progress Notes (Signed)
[place] PHYSICAL MEDICINE & REHABILITATION PROGRESS NOTE   Subjective/Complaints: Patient seen sitting up in bed this morning.  He appears to indicate that he did not sleep well overnight, but not respond to or engage with any other interaction.  ROS: limited due to nonverbal/behavior/cognition    Objective:   No results found. No results for input(s): WBC, HGB, HCT, PLT in the last 72 hours. Recent Labs    09/09/19 0511  NA 138  K 3.4*  CL 103  CO2 25  GLUCOSE 99  BUN 8  CREATININE 0.61  CALCIUM 9.3    Intake/Output Summary (Last 24 hours) at 09/11/2019 1033 Last data filed at 09/11/2019 0749 Gross per 24 hour  Intake 1083.75 ml  Output -  Net 1083.75 ml     Physical Exam: Vital Signs Blood pressure (!) 155/91, pulse 79, temperature 98.8 F (37.1 C), resp. rate 18, height 5\' 11"  (1.803 m), weight 88.8 kg, SpO2 99 %. Constitutional: No distress . Vital signs reviewed. HENT: Right craniectomy site C/D/I Eyes: EOMI. No discharge. Cardiovascular: No JVD. Respiratory: Normal effort.  No stridor. GI: Non-distended. Skin: See above Psych: Unable to assess due to language and cognition Musc: No edema in extremities.  No tenderness in extremities. Neuro: Alert Left facial weakness  Left neglect Left hemiplegia  RUE/RLE appear to be 5/5 proximal distal   Assessment/Plan: 1. 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  Physiatrist is providing close team supervision and 24 hour management of active medical problems listed below.  Physiatrist and rehab team continue to assess barriers to discharge/monitor patient progress toward functional and medical goals  Care Tool:  Bathing    Body parts bathed by patient: Right upper leg, Face, Front perineal area, Chest   Body parts bathed by helper: Right arm, Left arm, Abdomen, Buttocks, Left upper leg,  Right lower leg, Left lower leg, Face     Bathing assist Assist Level: Total Assistance - Patient < 25%     Upper Body Dressing/Undressing Upper body dressing   What is the patient wearing?: Hospital gown only    Upper body assist Assist Level: Dependent - Patient 0%    Lower Body Dressing/Undressing Lower body dressing      What is the patient wearing?: Incontinence brief     Lower body assist Assist for lower body dressing: Total Assistance - Patient < 25%     Toileting Toileting Toileting Activity did not occur Landscape architect and hygiene only): N/A (no void or bm)  Toileting assist Assist for toileting: 2 Helpers     Transfers Chair/bed transfer  Transfers assist     Chair/bed transfer assist level: 2 Helpers     Locomotion Ambulation   Ambulation assist   Ambulation activity did not occur: Safety/medical concerns          Walk 10 feet activity   Assist  Walk 10 feet activity did not occur: Safety/medical concerns        Walk 50 feet activity   Assist Walk 50 feet with 2 turns activity did not occur: Safety/medical concerns         Walk 150 feet activity   Assist Walk 150 feet activity did not occur: Safety/medical concerns         Walk 10 feet on uneven surface  activity   Assist Walk 10 feet on uneven surfaces activity did not occur: Safety/medical concerns  Wheelchair     Assist Will patient use wheelchair at discharge?: Yes Type of Wheelchair: Manual Wheelchair activity did not occur: Safety/medical concerns         Wheelchair 50 feet with 2 turns activity    Assist    Wheelchair 50 feet with 2 turns activity did not occur: Safety/medical concerns       Wheelchair 150 feet activity     Assist  Wheelchair 150 feet activity did not occur: Safety/medical concerns       Blood pressure (!) 155/91, pulse 79, temperature 98.8 F (37.1 C), resp. rate 18, height 5\' 11"  (1.803 m),  weight 88.8 kg, SpO2 99 %. Medical Problem List and Plan: 1.Left-sided weakness/dysphagiasecondary to right MCA/ACA infarction withR ICA,R MCAocclusion with cerebral edema status post hemicraniectomy with abdominal flap implant 123XX123 complicated bycerebral abscess/empyema hemorrhagic conversion  Continue CIR  PRAFO, WHO Left  -wear helmet when up with therapies 2. Antithrombotics: -DVT/anticoagulation:Right peroneal, left popliteal, left posterior tibial and left peroneal DVT. Status post IVC filter 07/21/2019 with plan for retrieval 8 to 12 weeks. Patient currently remains on subcutaneous heparin. No plan for long-term anticoagulation -antiplatelet therapy: N/A 3. Pain Management:Tramadol as needed 4. Mood:Provide motional support -antipsychotic agents: N/A 5. Neuropsych: This patientis notcapable of making decisions on hisown behalf. 6. Skin/Wound Care:Routine skin checks 7. Fluids/Electrolytes/Nutrition:Routine in and outs.  Hypokalemia-potassium 3.4 on 10/16, continue supplementation, labs ordered for tomorrow  -mild transaminitis-labs ordered for tomorrow  -check prealbumin Monday  -see #8 8. Post stroke dysphagia. Dysphagia #2withnectar liquids. Follow-up speech therapy -needs cueing/assistance with meals 9. Seizure prophylaxis. Keppra 500 mg twice daily 10. Cerebral abscess/empyema/cerebritis. Cultures no growth to date. Continue IV vancomycin and cefepime 08/20/2019 x 6 to 8 weeks per infectious disease.  Vanc trough on 10/16 elevated 11. Pseudomonas UTI. Zosyn completed 12. Hypertension with tachycardia. Lopressor 50 mg every 8 hours.  Elevated this a.m., otherwise relatively controlled monitor for persistence 13. History of tobacco marijuana use. Urine drug screen positive marijuana. Provide counseling with family 46.  Sleep disturbance  Sleep chart ordered     LOS: 5 days A FACE TO FACE  EVALUATION WAS PERFORMED  Kristiane Morsch Lorie Phenix 09/11/2019, 10:33 AM

## 2019-09-12 ENCOUNTER — Inpatient Hospital Stay (HOSPITAL_COMMUNITY): Payer: Medicaid Other | Admitting: Speech Pathology

## 2019-09-12 ENCOUNTER — Inpatient Hospital Stay (HOSPITAL_COMMUNITY): Payer: Medicaid Other

## 2019-09-12 ENCOUNTER — Inpatient Hospital Stay (HOSPITAL_COMMUNITY): Payer: Medicaid Other | Admitting: Occupational Therapy

## 2019-09-12 LAB — COMPREHENSIVE METABOLIC PANEL
ALT: 76 U/L — ABNORMAL HIGH (ref 0–44)
AST: 33 U/L (ref 15–41)
Albumin: 3.2 g/dL — ABNORMAL LOW (ref 3.5–5.0)
Alkaline Phosphatase: 107 U/L (ref 38–126)
Anion gap: 10 (ref 5–15)
BUN: 7 mg/dL (ref 6–20)
CO2: 25 mmol/L (ref 22–32)
Calcium: 9 mg/dL (ref 8.9–10.3)
Chloride: 105 mmol/L (ref 98–111)
Creatinine, Ser: 0.64 mg/dL (ref 0.61–1.24)
GFR calc Af Amer: >60 mL/min (ref >60)
GFR calc non Af Amer: >60 mL/min (ref >60)
Glucose, Bld: 97 mg/dL (ref 70–99)
Potassium: 3.5 mmol/L (ref 3.5–5.1)
Sodium: 140 mmol/L (ref 135–145)
Total Bilirubin: 0.6 mg/dL (ref 0.3–1.2)
Total Protein: 6.8 g/dL (ref 6.5–8.1)

## 2019-09-12 LAB — VANCOMYCIN, TROUGH: Vancomycin Tr: 8 ug/mL — ABNORMAL LOW (ref 15–20)

## 2019-09-12 LAB — PREALBUMIN: Prealbumin: 26 mg/dL (ref 18–38)

## 2019-09-12 MED ORDER — VANCOMYCIN HCL 10 G IV SOLR
1500.0000 mg | Freq: Three times a day (TID) | INTRAVENOUS | Status: DC
  Administered 2019-09-12 – 2019-09-15 (×10): 1500 mg via INTRAVENOUS
  Filled 2019-09-12 (×13): qty 1500

## 2019-09-12 MED ORDER — VANCOMYCIN HCL 10 G IV SOLR
1500.0000 mg | INTRAVENOUS | Status: AC
  Administered 2019-09-12: 1500 mg via INTRAVENOUS
  Filled 2019-09-12: qty 1500

## 2019-09-12 NOTE — Progress Notes (Signed)
Physical Therapy Session Note  Patient Details  Name: RIELEY JAECKS MRN: [id number] Date of Birth: 1976-07-07  Today's Date: 09/12/2019 PT Individual Time: 0800-0920 PT Individual Time Calculation (min): 80 min   Short Term Goals: Week 1:  PT Short Term Goal 1 (Week 1): Pt will transfer bed<>chair w/ only +1 assist PT Short Term Goal 2 (Week 1): Pt will perform bed mobility w/ +1 assist PT Short Term Goal 3 (Week 1): Pt will tolerate 30 min of upright activity w/o increase in fatigue PT Short Term Goal 4 (Week 1): Pt will tolerate 60 min of sitting up in w/c in between therapy sessions  Skilled Therapeutic Interventions/Progress Updates:  Pt resting in bed.  He nodded slightly when asked if anything hurt, and if his head hurt.  Premedicated.  In supine- neuromuscular re-education via gentle oscilations, rotation, multimodal cues for LLE hip and knee spasticity reduction, facilitating movement of LLE.  After several reps, pt demonstrated intermittent L hip flexion/adduction, and L knee extension to bring his L foot back off of bed after PT placed it just off of bed.  Bed mobility training for rolling R, with max assist> mod assist.  After repetition, pt initiated movement with cervical flexion and abdominal activation to move upper body. From R side lying with bil hips flexed, pt extended bil feet, and sat up with max assist.  PT donned black helmet.   Midline orientation in sitting via R lateral leans>< midline with assistance.  Pt intermittently pushed with RUE toward L, causing LOB L.  Slide board transfer to R with max assist; 2nd person to stabilize tilt in space w/c.  Pt assisted by pulling on R armrest as he moved across board.  PT placed IV pole between pt's knees and he held it with R hand as PT pushed w/c.  PT adjusted L ELR, and pt used Investment banker, corporate appropriately when placed in his R hand, ELR in his lap.   Slide board transfer as above to R, to mat.  Seated EOM with wedge under  hips to reduce posterior pelvic tilt; pt placed playing cards on board in front of him, with R hand, accurate 7/9 cards.  Cues for holding up head and scanning L to find match.  Pt fatigued with this activity during  sitting up for 20 min EOM.  Slide board transfer to r to return to w/c.  In w/c, PT tilted pt back to reduce work for holding head up.  At end of session, pt in w/c with seat belt alarm set, mitt on R hand and needs in reach.     Therapy Documentation Precautions:  Precautions Precautions: Fall, Other (comment) Precaution Comments: crani helmet when OOB Required Braces or Orthoses: Other Brace Restrictions Weight Bearing Restrictions: No       Therapy/Group: Individual Therapy  Adalee Kathan 09/12/2019, 12:12 PM

## 2019-09-12 NOTE — Progress Notes (Signed)
Pharmacy Antibiotic Note  Matthew Black is a 43 y.o. male transferred from acute to CIR on  09/06/2019.  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  Pharmacy has been consulted to continue dosing/monitoring cefepime and vancomycin for brain abscess/cerebritis.   Pt was started on vancomycin and cefepime on 08/20/19, with planned duration of 6-8 weeks per ID. On transfer to CIR, he was receiving cefepime 2 gm IV Q 8 hrs and vancomycin 1250 mg IV Q 8 hrs, with goal vancomycin trough level of 15-20 mg/L.   10/16 Vancomycin trough = 21 10/19 Vancomycin trough = 8 (no missed doses) - unsure reason for the dramatic drop in trough. Will conservatively increase.  WBC 5.6, afebrile; Scr 0.6s, CrCl > 100 ml/min (renal function stable)  Plan: Continue cefepime 2 gm IV Q 8 hrs  Increase vancomycin to 1500 mg IV Q 8 hrs (goal vanc trough of 15-20 mg/L) Trough at steady-state Will target higher end of goal range given need for brain penetration. Monitor renal function, WBC, temp, vancomycin levels, cultures, clinical improvement  Height: 5\' 11"  (180.3 cm) Weight: 195 lb 12.3 oz (88.8 kg) IBW/kg (Calculated) : 75.3  Temp (24hrs), Avg:98.9 F (37.2 C), Min:98.9 F (37.2 C), Max:99 F (37.2 C)  Recent Labs  Lab 09/07/19 0440 09/09/19 0511 09/12/19 0417 09/12/19 1330  WBC 5.6  --   --   --   CREATININE 0.61 0.61 0.64  --   VANCOTROUGH  --  21*  --  8*    Estimated Creatinine Clearance: 129.4 mL/min (by C-G formula based on SCr of 0.64 mg/dL).    Allergies  Allergen  Reactions  . Hydrocodone Nausea Only    Antimicrobials this admission: 9/21 Zosyn >> 9/26 9/26 Cefepime >> (11/20) 9/26 Vancomycin >> (11/20)  Microbiology results: 9/27 BCx X 2: NG/final 9/17  UCx: Pseudomonas aeruginosa (suscept to Zosyn) 9/14  Abdominal fluid: Gram stain: rate WBCs, NOS; cx: NG/final 9/27 Epidural abscess: Gram stain: mod WBCs (PMNs), NOS; cx: NG/final 9/27 MRSA PCR: positive   Thank you for involving pharmacy in this patient's care.  Renold Genta, PharmD, BCPS Clinical Pharmacist Clinical phone for 09/12/2019 until 3p is [phone] 09/12/2019 4:13 PM  **Pharmacist phone directory can be found on [email or web address] listed under [place]**

## 2019-09-12 NOTE — Progress Notes (Signed)
Speech Language Pathology Daily Session Note  Patient Details  Name: Matthew Black MRN: [id number] Date of Birth: 11-13-1976  Today's Date: 09/12/2019 SLP Individual Time: 1000-1055 SLP Individual Time Calculation (min): 55 min  Short Term Goals: Week 1: SLP Short Term Goal 1 (Week 1): Patient will consume trials of thin liquids without overt s/s of aspiration and Min verbal cues over 2 sessions to assess readiness for repeat MBS. SLP Short Term Goal 2 (Week 1): Patient will demonstrate efficient mastication with complete oral clearance with trials of Dys. 3 textures with Min verbal cues over 2 sessions prior to upgrade. SLP Short Term Goal 3 (Week 1): Patient will vocalize on command in 25% of opportunities with Max A multimodal cues. SLP Short Term Goal 4 (Week 1): Patient will demonstrate sustained attention to tasks for ~5 minutes with Max verbal cues for redirection. SLP Short Term Goal 5 (Week 1): Patient will scan to midline/left field of enviornment ot locate functional items with Max A multimodal cues. SLP Short Term Goal 6 (Week 1): Patient will demonstrate basic problem solving with Max A multimodal cues during functional tasks.  Skilled Therapeutic Interventions: Skilled treatment session focused on cognitive-linguistic goals. SLP facilitated session by providing Max A verbal and visual cues for patient to scan towards midline/left field of environment during a color matching task. Patient completed task with 75% accuracy.  Patient appeared more restless today and required Max verbal and tactile cues to attend to sorting task for ~1-2 minute intervals. Despite Max A multimodal cues, patient unable to vocalize or verbalize on command and attempted to write. Patient's writing appeared nonsensical in nature with spelling errors noted with only 1-2 words legible. Patient's wife present at end of session and educated on SLP goals and current barriers to progress. All questions were answered  at this time. Patient left upright in wheelchair with wife present, alarm on, and all needs within reach. Continue with current plan of care.      Pain No/Denies Pain   Therapy/Group: Individual Therapy  [name] 09/12/2019, 2:55 PM

## 2019-09-12 NOTE — Progress Notes (Signed)
[place] PHYSICAL MEDICINE & REHABILITATION PROGRESS NOTE   Subjective/Complaints: No issues reported overnight. Sleep still an issue.   ROS: limited due to language/communication    Objective:   No results found. No results for input(s): WBC, HGB, HCT, PLT in the last 72 hours. Recent Labs    09/12/19 0417  NA 140  K 3.5  CL 105  CO2 25  GLUCOSE 97  BUN 7  CREATININE 0.64  CALCIUM 9.0    Intake/Output Summary (Last 24 hours) at 09/12/2019 1033 Last data filed at 09/12/2019 0944 Gross per 24 hour  Intake 2410.76 ml  Output -  Net 2410.76 ml     Physical Exam: Vital Signs Blood pressure 129/84, pulse 81, temperature 98.9 F (37.2 C), resp. rate 18, height 5\' 11"  (1.803 m), weight 88.8 kg, SpO2 100 %. Constitutional: No distress . Vital signs reviewed. HEENT: EOMI, oral membranes moist, crani site CDI Neck: supple Cardiovascular: RRR without murmur. No JVD    Respiratory: CTA Bilaterally without wheezes or rales. Normal effort    GI: BS +, non-tender, non-distended  Skin: See above Psych: flat Musc: No edema in extremities.  No tenderness in extremities. Neuro: Alert 0/5 LUE and LLE. Left inattention RUE/RLE appear to be 5/5 proximal distal   Assessment/Plan: 1. 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  Physiatrist is providing close team supervision and 24 hour management of active medical problems listed below.  Physiatrist and rehab team continue to assess barriers to discharge/monitor patient progress toward functional and medical goals  Care Tool:  Bathing    Body parts bathed by patient: Right upper leg, Face, Front perineal area, Chest   Body parts bathed by helper: Right arm, Left arm, Abdomen, Buttocks, Left upper leg, Right lower leg, Left lower leg, Face     Bathing assist Assist Level: Total Assistance - Patient < 25%     Upper Body Dressing/Undressing Upper body dressing   What is the patient wearing?: Hospital gown only    Upper body assist Assist Level: Dependent - Patient 0%    Lower Body Dressing/Undressing Lower body dressing      What is the patient wearing?: Incontinence brief     Lower body assist Assist for lower body dressing: Total Assistance - Patient < 25%     Toileting Toileting Toileting Activity did not occur Landscape architect and hygiene only): N/A (no void or bm)  Toileting assist Assist for toileting: 2 Helpers     Transfers Chair/bed transfer  Transfers assist     Chair/bed transfer assist level: 2 Helpers     Locomotion Ambulation   Ambulation assist   Ambulation activity did not occur: Safety/medical concerns          Walk 10 feet activity   Assist  Walk 10 feet activity did not occur: Safety/medical concerns        Walk 50 feet activity   Assist Walk 50 feet with 2 turns activity did not occur: Safety/medical concerns         Walk 150 feet activity   Assist Walk 150 feet activity did not occur: Safety/medical concerns         Walk 10 feet on uneven surface  activity   Assist Walk 10 feet on uneven surfaces activity did not occur: Safety/medical concerns         Wheelchair     Assist Will patient use wheelchair  at discharge?: Yes Type of Wheelchair: Manual Wheelchair activity did not occur: Safety/medical concerns         Wheelchair 50 feet with 2 turns activity    Assist    Wheelchair 50 feet with 2 turns activity did not occur: Safety/medical concerns       Wheelchair 150 feet activity     Assist  Wheelchair 150 feet activity did not occur: Safety/medical concerns       Blood pressure 129/84, pulse 81, temperature 98.9 F (37.2 C), resp. rate 18, height 5\' 11"  (1.803 m), weight 88.8 kg, SpO2 100 %. Medical Problem List and Plan: 1.Left-sided weakness/dysphagiasecondary to right  MCA/ACA infarction withR ICA,R MCAocclusion with cerebral edema status post hemicraniectomy with abdominal flap implant 123XX123 complicated bycerebral abscess/empyema hemorrhagic conversion  Continue CIR PT, OT, SLP  PRAFO, WHO Left  -wear helmet when up with therapies 2. Antithrombotics: -DVT/anticoagulation:Right peroneal, left popliteal, left posterior tibial and left peroneal DVT. Status post IVC filter 07/21/2019 with plan for retrieval 8 to 12 weeks. Patient currently remains on subcutaneous heparin. No plan for long-term anticoagulation -antiplatelet therapy: N/A 3. Pain Management:Tramadol, tylenol as needed 4. Mood:Provide motional support -antipsychotic agents: N/A 5. Neuropsych: This patientis notcapable of making decisions on hisown behalf. 6. Skin/Wound Care:Routine skin checks 7. Fluids/Electrolytes/Nutrition:Routine in and outs.  Hypokalemia-potassium 3.4 on 10/16, continue supplementation---> 3.5 today 10/19  -mild transaminitis-LFT's continue to normalize  -prealbumin 26.0 10/19 8. Post stroke dysphagia. Dysphagia #2withnectar liquids. Follow-up speech therapy -needs cueing/assistance with meals 9. Seizure prophylaxis. Keppra 500 mg twice daily 10. Cerebral abscess/empyema/cerebritis. Cultures no growth to date. Continue IV vancomycin and cefepime 08/20/2019 x 6 to 8 weeks per infectious disease.  Vanc dosing per pharmacy 11. Pseudomonas UTI. Zosyn completed 12. Hypertension with tachycardia. Lopressor 50 mg every 8 hours.    13. History of tobacco marijuana use. Urine drug screen positive marijuana. Provide counseling with family 8.  Sleep disturbance  Fair sleep last night 6-8 hours     LOS: 6 days A FACE TO FACE EVALUATION WAS PERFORMED  Meredith Staggers 09/12/2019, 10:33 AM

## 2019-09-12 NOTE — Progress Notes (Signed)
Occupational Therapy Session Note  Patient Details  Name: Matthew Black MRN: [id number] Date of Birth: 01-01-1976  Today's Date: 09/12/2019 OT Individual Time: 3568-6168 OT Individual Time Calculation (min): 57 min    Short Term Goals: Week 1:  OT Short Term Goal 1 (Week 1): Pt will tolerate sitting EOB for 5 minutes within BADL task. OT Short Term Goal 2 (Week 1): Pt will maintain sitting balance with mod A of 1 person OT Short Term Goal 3 (Week 1): Pt will complete UB dressing with max A of 1 OT Short Term Goal 4 (Week 1): Pt will locate 2/3 grooming items on the L side of the sink.  Skilled Therapeutic Interventions/Progress Updates:    Pt greeted semi-reclined in bed with wife present who was feeding pt lunch. Pt's spouse stated that pt had been able to feed himself most of his lunch today, but he was getting tired. OT asked pt what his wife's name was, but he would not verbalize. OT made a joke to patient that he better not get it wrong bc she would be mad and he verbalized "she doesn't get mad." Pt's sentence was in a whisper. Pt's wife reported he told her today "get me into the bathroom." Pt did not verbalize anything else throughout session despite encouragement and only communicated with pointing and head nods. Pt brought to sitting EOB with total A. Worked on sitting balance with pt leaning on pillow on R elbow to decrease pushing. Helmet placed in sitting. Pt completed slideboard transfer to TIS wc with total A +2. Total A +2 slideboard to therapy mat. OT sat on bosu ball behind pt for trunk support. Had mirror placed in front of pt and facilitated head/neck extension into neautral to look into mirror. Rehab tech gave pt medium bosu ball placed on his lap and R hand placed over flaccid L hand while pt pushed and pulled ball forward and back-guided A from therapy tech and OT support at L shoulder 2/2 subluxation. Worked on head/neck and trunk control as well as L visual scanning with cup  stacking activity. Rehab tech placed cups in L upper quadrant, then pt reacher for cups and stacked them far away on R side. Mod cues to locate cups. Pt grabbing between his legs and OT asked if he needed to go to the bathroom and he nodded head "yes." Slideboard transfer back to TIS wc with Max A +2 towards stronger R side and pt able to assist this time. Total A +2 slideboard back to bed. Bed pan and urinal placed. Wedge placed under L hip to decrease hip/knee external rotation and nursing notified of pt status. Bed alarm on and needs met.  Therapy Documentation Precautions:  Precautions Precautions: Fall, Other (comment) Precaution Comments: crani helmet when OOB Required Braces or Orthoses: Other Brace Restrictions Weight Bearing Restrictions: No Pain:   Pt made grimace when OT moved L LE indicating pain in LLE. Subsided with rest.  Therapy/Group: Individual Therapy  Valma Cava 09/12/2019, 3:02 PM

## 2019-09-13 ENCOUNTER — Inpatient Hospital Stay (HOSPITAL_COMMUNITY): Payer: Medicaid Other | Admitting: Speech Pathology

## 2019-09-13 ENCOUNTER — Inpatient Hospital Stay (HOSPITAL_COMMUNITY): Payer: Medicaid Other | Admitting: Physical Therapy

## 2019-09-13 ENCOUNTER — Inpatient Hospital Stay (HOSPITAL_COMMUNITY): Payer: Medicaid Other | Admitting: Occupational Therapy

## 2019-09-13 MED ORDER — METHYLPHENIDATE HCL 5 MG PO TABS
5.0000 mg | ORAL_TABLET | Freq: Two times a day (BID) | ORAL | Status: DC
  Administered 2019-09-14 – 2019-09-22 (×17): 5 mg via ORAL
  Filled 2019-09-13 (×19): qty 1

## 2019-09-13 NOTE — Progress Notes (Signed)
Occupational Therapy Weekly Progress Note  Patient Details  Name: Matthew Black MRN: [id number] Date of Birth: 10-21-1976  Beginning of progress report period: September 07, 2019 End of progress report period: September 13, 2019  Today's Date: 09/13/2019 OT Individual Time: 1335-1420 OT Individual Time Calculation (min): 45 min    Patient has met 1 of 4 short term goals.  Pt is progressing slowly towards OT goals. Pt is working on slideboard transfers, but still needs max/total A of 2 people. He is able to sit EOB for 5 minutes in prep for ADL tasks, but needs max/total A for sitting balance. Pt with very weak trunk, head/neck. 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 Pt currently still needs Max/Total A +2 for all BADL tasks. Continue current POC.  Patient continues to demonstrate the following deficits: muscle weakness, decreased cardiorespiratoy endurance, impaired timing and sequencing, abnormal tone, unbalanced muscle activation, motor apraxia, ataxia, decreased coordination and decreased motor planning, decreased midline orientation, decreased attention to left, left side neglect and decreased motor planning, decreased initiation, decreased attention, decreased awareness, decreased problem solving, decreased safety awareness, decreased memory and delayed processing and decreased sitting balance, decreased standing balance, decreased postural control, hemiplegia, decreased balance strategies and difficulty maintaining precautions and therefore will continue to benefit from skilled OT intervention to enhance overall performance with BADL and Reduce care partner burden.  Patient progressing toward long term goals..  Continue plan of care.  OT Short Term Goals Week 1:  OT Short Term Goal 1 (Week 1): Pt will tolerate sitting EOB for 5 minutes within BADL task. OT Short Term Goal 1 - Progress (Week 1): Met OT Short Term Goal 2 (Week 1): Pt will maintain sitting  balance with mod A of 1 person OT Short Term Goal 2 - Progress (Week 1): Progressing toward goal OT Short Term Goal 3 (Week 1): Pt will complete UB dressing with max A of 1 OT Short Term Goal 3 - Progress (Week 1): Progressing toward goal OT Short Term Goal 4 (Week 1): Pt will locate 2/3 grooming items on the L side of the sink. OT Short Term Goal 4 - Progress (Week 1): Progressing toward goal Week 2:  OT Short Term Goal 1 (Week 2): Pt will maintain sitting balance with max A of 1 person OT Short Term Goal 2 (Week 2): Pt will complete UB dressing with max A of 1 OT Short Term Goal 3 (Week 2): Pt will locate grooming items on L side of the sink with mod verbal cues  Skilled Therapeutic Interventions/Progress Updates:    Pt greeted semi-reclined in bed with spouse present. Pt's spouse reported pt had been incontinent of urine. Worked on bed mobility with rolling to the L with max cues and mod A. When OT was cleaning pt, noted smear of BM. Asked pt if he needed to get on the bed pan and he nodded head yes. Pt placed on bedpan in quiet environment. Pt unable to have BM on bed pan-possible attention or initiation deficits. Total A for peri-care and to don new brief. Worked on hip bridging in bed to pull up pants with OT supporting L LE in knee flexion. Pt initiated lifting hips to pull pants up R side, but needed max cues to initiate trying to lift hips again for OT to assist pulling pants up L side. Brought pt into hook lying on L side, then brought pt up onto L elbow for weight bearing in sidelying,  prior to pushing up into sitting. Had pt place R hand on L knee to decrease pushing and challenge balance. UB bathing/dressing completed at EOB with max/total A for sitting balance while +2 assisted with bathing tasks. Pt was able to wash chest and L underarm. OT provided hand over hand A with L hand to help wash R arm. Worked on hemi-dressing with max cues to look to L to locate L arm. Overall total A for UB  dressing. Pt very fatigued after extended sitting activity and returned to bed with total A. Pt placed in sidelying on L side with pillows for hemi support. Had wife come over to L side to talk to him. Pt left with bed alarm on and R hand in mit.   Therapy Documentation Precautions:  Precautions Precautions: Fall, Other (comment) Precaution Comments: crani helmet when OOB Required Braces or Orthoses: Other Brace Restrictions Weight Bearing Restrictions: No Pain: Pt grimacing with OT moving L UE. OT repositioned for comfort.   Therapy/Group: Individual Therapy  Valma Cava 09/13/2019, 2:21 PM

## 2019-09-13 NOTE — Progress Notes (Signed)
Speech Language Pathology Daily Session Note  Patient Details  Name: Matthew Black MRN: [id number] Date of Birth: 1976-07-07  Today's Date: 09/13/2019  Session 1: SLP Individual Time: 1100-1200 SLP Individual Time Calculation (min): 60 min   Session 2: SLP Individual Time:  Missed Time: 30 minutes due to RN care   Short Term Goals: Week 1: SLP Short Term Goal 1 (Week 1): Patient will consume trials of thin liquids without overt s/s of aspiration and Min verbal cues over 2 sessions to assess readiness for repeat MBS. SLP Short Term Goal 2 (Week 1): Patient will demonstrate efficient mastication with complete oral clearance with trials of Dys. 3 textures with Min verbal cues over 2 sessions prior to upgrade. SLP Short Term Goal 3 (Week 1): Patient will vocalize on command in 25% of opportunities with Max A multimodal cues. SLP Short Term Goal 4 (Week 1): Patient will demonstrate sustained attention to tasks for ~5 minutes with Max verbal cues for redirection. SLP Short Term Goal 5 (Week 1): Patient will scan to midline/left field of enviornment ot locate functional items with Max A multimodal cues. SLP Short Term Goal 6 (Week 1): Patient will demonstrate basic problem solving with Max A multimodal cues during functional tasks.  Skilled Therapeutic Interventions:   Session 1: Skilled treatment session focused on communication goals. Upon arrival, patient was upright in the wheelchair. Patient indicated he had a headache and was able to rate his pain by writing down a number. RN made aware. Patient began to spontaneously verbalize at the sentence level and reported a confabulatory story about a man standing in his bathroom watching him sleep while holding a shotgun. Intelligibility was reduced due to low vocal intensity and repetition was needed throughout but no obvious word-finding or aphasia noted. Patient's wife present and educated in regards to language of confusion and the importance of  making sure the patient's television is off at night while sleeping. She verbalized understanding. Patient appeared restless and was scooting out of the chair. SLP attempted to reposition patient without success and patient was wet to the touch. Patient was transferred back to bed via the [facility] and required Min-Mod verbal and tactile cues to follow commands during bed mobility while changing  brief. Patient left supine in bed with alarm on and wife present. Continue with current plan of care.   Session 2: Patient missed 30 mins of skilled SLP intervention due to RN care. Patient on bedpan then was incontinent of urine requiring a linen change.   Pain 9/10 headache. RN aware and administered medications   Therapy/Group: Individual Therapy  Kynleigh Artz 09/13/2019, 12:38 PM

## 2019-09-13 NOTE — Progress Notes (Signed)
Physical Therapy Session Note  Patient Details  Name: Matthew Black MRN: [id number] Date of Birth: 12-27-1975  Today's Date: 09/13/2019 PT Individual Time: 0800-0915 PT Individual Time Calculation (min): 75 min   Short Term Goals: Week 1:  PT Short Term Goal 1 (Week 1): Pt will transfer bed<>chair w/ only +1 assist PT Short Term Goal 2 (Week 1): Pt will perform bed mobility w/ +1 assist PT Short Term Goal 3 (Week 1): Pt will tolerate 30 min of upright activity w/o increase in fatigue PT Short Term Goal 4 (Week 1): Pt will tolerate 60 min of sitting up in w/c in between therapy sessions  Skilled Therapeutic Interventions/Progress Updates:   Pt in supine and remained nonverbal throughout session but utilized head nods and thumbs up/down for all communication, pt confirms he has a headache but no evidence of pain throughout session. Supine>sit w/ total assist +1. Slide board transfer to TIS, total assist +2. Total assist w/c transport to/from therapy gym.   Slide board to edge of mat via same technique as above. Worked on sitting balance, midline orientation, and postural control. Therapy ball behind pt to provide support and therapist providing tactile and verbal cues for midline as well as mirror for visual feedback. Worked on L attention/visual scanning and postural control w/ card matching on L visual field. Tactile and verbal cues to attend to L environment and min-mod assist overall for static sitting balance, mod-max assist for dynamic sitting balance w/ reaching to L and returning to upright. 20+ min in this position prior to reclining on wedge/pillow for rest break while completing card matching task. Needed max multimodal cues overall for L environmental awareness and scanning. Returned to seated and worked on static standing in stedy. Sit<>stands w/ mod-max assist to boost up into standing, max assist to maintain midline w/ mirror for visual feedback, tactile and verbal cues for midline as  well. Stood all the way upright in stedy w/ tactile and verbal cues for L knee extension, no palpable contraction felt. Rest breaks in stedy for WB.   Pt w/ sudden onset of sweating/diaphoresis. Returned to TIS and BP 136/102, HR 76 bpm and pt confirms he does not feel well w/ head nods. Tilted in TIS and cold washcloth applied to head and neck, BP 126/86, and pt confirmed he was feeling better now that he was tilted.   Returned to room and set-up to eat breakfast in TIS. Min cues to attend to L side of tray. Ended session in care of NT, all needs met. Adjusted pt's room set-up to promote L visual/environmental scanning.   Therapy Documentation Precautions:  Precautions Precautions: Fall, Other (comment) Precaution Comments: crani helmet when OOB Required Braces or Orthoses: Other Brace Restrictions Weight Bearing Restrictions: No Pain: Pain Assessment Pain Scale: Faces Faces Pain Scale: No hurt   Therapy/Group: Individual Therapy  Bethanie Bloxom Clent Demark 09/13/2019, 9:17 AM

## 2019-09-13 NOTE — Progress Notes (Signed)
[facility] PHYSICAL MEDICINE & REHABILITATION PROGRESS NOTE   Subjective/Complaints: No new complaints. Up with therapy having pus-like drainage from scalp  ROS: limited due to language/communication    Objective:   No results found. No results for input(s): WBC, HGB, HCT, PLT in the last 72 hours. Recent Labs    09/12/19 0417  NA 140  K 3.5  CL 105  CO2 25  GLUCOSE 97  BUN 7  CREATININE 0.64  CALCIUM 9.0    Intake/Output Summary (Last 24 hours) at 09/13/2019 1000 Last data filed at 09/13/2019 0900 Gross per 24 hour  Intake 1200 ml  Output -  Net 1200 ml     Physical Exam: Vital Signs Blood pressure 122/89, pulse 80, temperature 99.4 F (37.4 C), temperature source Oral, resp. rate 18, height 5\' 11"  (1.803 m), weight 88.8 kg, SpO2 100 %. Constitutional: No distress . Vital signs reviewed. HEENT: EOMI, oral membranes moist Neck: supple Cardiovascular: RRR without murmur. No JVD    Respiratory: CTA Bilaterally without wheezes or rales. Normal effort    GI: BS +, non-tender, non-distended   Skin: area of hypergranulation tissue on posterior aspect of crani incision. Draining a pus-like, thick tan colored material, spread in hair. No odor.  Psych: flat Musc: No edema in extremities.  No tenderness in extremities. Neuro: Alert 0/5 LUE and LLE. Left inattention RUE/RLE appear to be 5/5 proximal distal   Assessment/Plan: 1. 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  Physiatrist is providing close team supervision and 24 hour management of active medical problems listed below.  Physiatrist and rehab team continue to assess barriers to discharge/monitor patient progress toward functional and medical goals  Care Tool:  Bathing    Body parts bathed by patient: Right upper leg, Face, Front perineal area, Chest   Body parts bathed by helper: Right  arm, Left arm, Abdomen, Buttocks, Left upper leg, Right lower leg, Left lower leg, Face     Bathing assist Assist Level: Total Assistance - Patient < 25%     Upper Body Dressing/Undressing Upper body dressing   What is the patient wearing?: Hospital gown only    Upper body assist Assist Level: Dependent - Patient 0%    Lower Body Dressing/Undressing Lower body dressing      What is the patient wearing?: Incontinence brief     Lower body assist Assist for lower body dressing: Total Assistance - Patient < 25%     Toileting Toileting Toileting Activity did not occur Landscape architect and hygiene only): N/A (no void or bm)  Toileting assist Assist for toileting: 2 Helpers     Transfers Chair/bed transfer  Transfers assist     Chair/bed transfer assist level: 2 Helpers     Locomotion Ambulation   Ambulation assist   Ambulation activity did not occur: Safety/medical concerns          Walk 10 feet activity   Assist  Walk 10 feet activity did not occur: Safety/medical concerns        Walk 50 feet activity   Assist Walk 50 feet with 2 turns activity did not occur: Safety/medical concerns         Walk 150 feet activity   Assist Walk 150 feet activity did not occur: Safety/medical concerns         Walk 10 feet on uneven surface  activity   Assist Walk 10 feet on uneven  surfaces activity did not occur: Safety/medical concerns         Wheelchair     Assist Will patient use wheelchair at discharge?: Yes Type of Wheelchair: Manual Wheelchair activity did not occur: Safety/medical concerns         Wheelchair 50 feet with 2 turns activity    Assist    Wheelchair 50 feet with 2 turns activity did not occur: Safety/medical concerns       Wheelchair 150 feet activity     Assist  Wheelchair 150 feet activity did not occur: Safety/medical concerns       Blood pressure 122/89, pulse 80, temperature 99.4 F (37.4  C), temperature source Oral, resp. rate 18, height 5\' 11"  (1.803 m), weight 88.8 kg, SpO2 100 %. 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 Antithrombotics: -DVT/anticoagulation:Right peroneal, left popliteal, left posterior tibial and left peroneal DVT. Status post IVC filter 07/21/2019 with plan for retrieval 8 to 12 weeks. Patient currently remains on subcutaneous heparin. No plan for long-term anticoagulation -antiplatelet therapy: N/A 3. Pain Management:Tramadol, tylenol as needed 4. Mood:Provide motional support -antipsychotic agents: N/A 5. Neuropsych: This patientis notcapable of making decisions on hisown behalf. 6. Skin/Wound Care:Routine skin checks  -can dress scalp as needed to absorb any drainage 7. Fluids/Electrolytes/Nutrition:Routine in and outs.  Hypokalemia-potassium 3.4 on 10/16, continue supplementation---> 3.5 today 10/19  -mild transaminitis-LFT's continue to normalize  -prealbumin 26.0 10/19 8. Post stroke dysphagia. Dysphagia #2withnectar liquids. Follow-up speech therapy -needs cueing/assistance with meals 9. Seizure prophylaxis. Keppra 500 mg twice daily 10. Cerebral abscess/empyema/cerebritis. Cultures no growth to date. Continue IV vancomycin and cefepime 08/20/2019 x 6 to 8 weeks per infectious disease.  Vanc dosing per pharmacy  -scalp wound with pus-like drainage---anything coming out of there should be covered by his current regimen   -can collect a sample for culture  -does have low grade temp  -recheck cbc in am 11. Pseudomonas UTI. Zosyn completed 12. Hypertension with  tachycardia. Lopressor 50 mg every 8 hours.    13. History of tobacco marijuana use. Urine drug screen positive marijuana. Provide counseling with family 45.  Sleep disturbance  Showing some improvement     LOS: 7 days A FACE TO FACE EVALUATION WAS PERFORMED  Meredith Staggers 09/13/2019, 10:00 AM

## 2019-09-13 NOTE — Care Management (Signed)
[facility] Individual Statement of Services  Patient Name:  Matthew Black  Date:  09/13/2019  Welcome to the [facility].  Our goal is to provide you with an individualized program based on your diagnosis and situation, designed to meet your specific needs.  With this comprehensive rehabilitation program, you will be expected to participate in at least 3 hours of rehabilitation therapies Monday-Friday, with modified therapy programming on the weekends.  Your rehabilitation program will include the following services:  Physical Therapy (PT), Occupational Therapy (OT), Speech Therapy (ST), 24 hour per day rehabilitation nursing, Therapeutic Recreaction (TR), Neuropsychology, Case Management (Social Worker), Rehabilitation Medicine, Nutrition Services and Pharmacy Services  Weekly team conferences will be held on Tuesdays to discuss your progress.  Your Social Worker will talk with you frequently to get your input and to update you on team discussions.  Team conferences with you and your family in attendance may also be held.  Expected length of stay: 3-4 weeks   Overall anticipated outcome: moderate assistance at wheelchair  Depending on your progress and recovery, your program may change. Your Social Worker will coordinate services and will keep you informed of any changes. Your Social Worker's name and contact numbers are listed  below.  The following services may also be recommended but are not provided by the [facility] will be made to provide these services after discharge if needed.  Arrangements include referral to agencies that provide these services.  Your insurance has been verified to be:  Medicaid Your primary doctor is:  [facility]  Pertinent information will be shared with your  doctor and your insurance company.  Social Worker:  [place], [place] or [phone]   Information discussed with and copy given to patient by: Lennart Pall, 09/12/2019, 3:43 PM

## 2019-09-14 ENCOUNTER — Inpatient Hospital Stay (HOSPITAL_COMMUNITY): Payer: Medicaid Other | Admitting: Physical Therapy

## 2019-09-14 ENCOUNTER — Inpatient Hospital Stay (HOSPITAL_COMMUNITY): Payer: Medicaid Other | Admitting: Speech Pathology

## 2019-09-14 ENCOUNTER — Inpatient Hospital Stay (HOSPITAL_COMMUNITY): Payer: Medicaid Other

## 2019-09-14 LAB — BASIC METABOLIC PANEL
Anion gap: 9 (ref 5–15)
BUN: 6 mg/dL (ref 6–20)
CO2: 24 mmol/L (ref 22–32)
Calcium: 9.2 mg/dL (ref 8.9–10.3)
Chloride: 107 mmol/L (ref 98–111)
Creatinine, Ser: 0.72 mg/dL (ref 0.61–1.24)
GFR calc Af Amer: >60 mL/min (ref >60)
GFR calc non Af Amer: >60 mL/min (ref >60)
Glucose, Bld: 100 mg/dL — ABNORMAL HIGH (ref 70–99)
Potassium: 3.3 mmol/L — ABNORMAL LOW (ref 3.5–5.1)
Sodium: 140 mmol/L (ref 135–145)

## 2019-09-14 LAB — CBC
HCT: 34.5 % — ABNORMAL LOW (ref 39.0–52.0)
Hemoglobin: 11.3 g/dL — ABNORMAL LOW (ref 13.0–17.0)
MCH: 28 pg (ref 26.0–34.0)
MCHC: 32.8 g/dL (ref 30.0–36.0)
MCV: 85.4 fL (ref 80.0–100.0)
Platelets: 202 10*3/uL (ref 150–400)
RBC: 4.04 MIL/uL — ABNORMAL LOW (ref 4.22–5.81)
RDW: 14.6 % (ref 11.5–15.5)
WBC: 5.2 10*3/uL (ref 4.0–10.5)
nRBC: 0 % (ref 0.0–0.2)

## 2019-09-14 MED ORDER — POTASSIUM CHLORIDE CRYS ER 20 MEQ PO TBCR
20.0000 meq | EXTENDED_RELEASE_TABLET | Freq: Every day | ORAL | Status: DC
  Administered 2019-09-14 – 2019-09-22 (×9): 20 meq via ORAL
  Filled 2019-09-14 (×9): qty 1

## 2019-09-14 MED ORDER — LEVETIRACETAM 100 MG/ML PO SOLN
500.0000 mg | Freq: Two times a day (BID) | ORAL | Status: DC
  Administered 2019-09-14 – 2019-10-07 (×45): 500 mg via ORAL
  Filled 2019-09-14 (×47): qty 5

## 2019-09-14 NOTE — Progress Notes (Signed)
Occupational Therapy Session Note  Patient Details  Name: Matthew Black MRN: [id number] Date of Birth: 26-Sep-1976  Today's Date: 09/14/2019 OT Individual Time: [id number] OT Individual Time Calculation (min): 55 min    Short Term Goals: Week 2:  OT Short Term Goal 1 (Week 2): Pt will maintain sitting balance with max A of 1 person OT Short Term Goal 2 (Week 2): Pt will complete UB dressing with max A of 1 OT Short Term Goal 3 (Week 2): Pt will locate grooming items on L side of the sink with mod verbal cues  Skilled Therapeutic Interventions/Progress Updates:    Pt asleep in bed upon arrival and easily aroused.  OT intervention with focus on bed moblity, sitting balance, following one step commands, slide board transfers, and activity tolerance to increase independence with BADLs.  Pt incontinent of bowel with no awareness.  Pt required tot A for hygiene and changing incontinence brief. Pt required min A rolling to L and mod A rolling to R. Pt raised RLE to facilitate threading pants at bed level.  Pt required tot A for threading LLE into pants.  Pt required max A for supine>sit EOB with noticeable pushing to L through RUE.  Pt required max A for sitting balance EOB.  Slide board transfer to w/c with tot A+2.  Pt repositioned in TIS w/c. Pt remained in TIS w/c with belt alarm activated, RUE mitt donned, lap tray in place, and all needs within reach (helmet donned prior to sitting EOB).  Therapy Documentation Precautions:  Precautions Precautions: Fall, Other (comment) Precaution Comments: crani helmet when OOB Required Braces or Orthoses: Other Brace Restrictions Weight Bearing Restrictions: No   Pain: Pt with no s/s of pain  Therapy/Group: Individual Therapy  Leroy Libman 09/14/2019, 11:25 AM

## 2019-09-14 NOTE — Progress Notes (Signed)
Speech Language Pathology Weekly Progress and Session Note  Patient Details  Name: Matthew Black MRN: [id number] Date of Birth: 03/23/1976  Beginning of progress report period: September 07, 2019 End of progress report period: September 14, 2019  Today's Date: 09/14/2019 SLP Individual Time: 0737-1062 SLP Individual Time Calculation (min): 55 min  Short Term Goals: Week 1: SLP Short Term Goal 1 (Week 1): Patient will consume trials of thin liquids without overt s/s of aspiration and Min verbal cues over 2 sessions to assess readiness for repeat MBS. SLP Short Term Goal 1 - Progress (Week 1): Not met SLP Short Term Goal 2 (Week 1): Patient will demonstrate efficient mastication with complete oral clearance with trials of Dys. 3 textures with Min verbal cues over 2 sessions prior to upgrade. SLP Short Term Goal 2 - Progress (Week 1): Not met SLP Short Term Goal 3 (Week 1): Patient will vocalize on command in 25% of opportunities with Max A multimodal cues. SLP Short Term Goal 3 - Progress (Week 1): Met SLP Short Term Goal 4 (Week 1): Patient will demonstrate sustained attention to tasks for ~5 minutes with Max verbal cues for redirection. SLP Short Term Goal 4 - Progress (Week 1): Not met SLP Short Term Goal 5 (Week 1): Patient will scan to midline/left field of enviornment ot locate functional items with Max A multimodal cues. SLP Short Term Goal 5 - Progress (Week 1): Met SLP Short Term Goal 6 (Week 1): Patient will demonstrate basic problem solving with Max A multimodal cues during functional tasks. SLP Short Term Goal 6 - Progress (Week 1): Not met    New Short Term Goals: Week 2: SLP Short Term Goal 1 (Week 2): Patient will consume trials of thin liquids without overt s/s of aspiration and Min verbal cues over 2 sessions to assess readiness for repeat MBS. SLP Short Term Goal 2 (Week 2): Patient will demonstrate efficient mastication with complete oral clearance with trials of Dys. 3  textures with Min verbal cues over 2 sessions prior to upgrade. SLP Short Term Goal 3 (Week 2): Patient will vocalize on command in 75% of opportunities with Max A multimodal cues. SLP Short Term Goal 4 (Week 2): Patient will demonstrate sustained attention to tasks for ~5 minutes with Max verbal cues for redirection. SLP Short Term Goal 5 (Week 2): Patient will scan to midline/left field of enviornment ot locate functional items with Mod A multimodal cues. SLP Short Term Goal 6 (Week 2): Patient will demonstrate basic problem solving with Max A multimodal cues during functional tasks.  Weekly Progress Updates: Patient has made minimal and inconsistent gains and has met 2 of 6 STGs this reporting period. Currently, patient is consuming Dys. 2 textures with nectar-thick liquids with minimal overt s/s of aspiration and Mod-Max A verbal cues for use of swallowing compensatory strategies. Trials have been administered intermittently with decreased oral manipulation and poor awareness of bolus, therefore continued trials needed. Patient remains essentially nonverbal but will verbalize intermittently, mostly with family members. However, when patient does verbalize, he is aphonic which impacts his intelligibility and demonstrates language of confusion. Patient utilizes gestures of majority of communication and can write answers to basic questions with spelling errors and perseveration noted.  Patient also requires overall Max A multimodal cues to complete functional and familiar tasks safely in regards to initiation, attention and scanning/attention to left field of environment. Patient and family education ongoing. Patient would benefit from continued skilled SLP intervention to maximize his cognitive  and swallowing function as well as functional communication prior to discharge.      Intensity: Minumum of 1-2 x/day, 30 to 90 minutes Frequency: 3 to 5 out of 7 days Duration/Length of Stay: 3  weeks Treatment/Interventions: Cognitive remediation/compensation;Dysphagia/aspiration precaution training;Internal/external aids;Speech/Language facilitation;Therapeutic Activities;Cueing hierarchy;Environmental controls;Functional tasks;Patient/family education   Daily Session  Skilled Therapeutic Interventions: Skilled treatment session focused on communication and dyspahgia goals. SLP facilitated session by providing extra time for patient to verbally name functional items with 100% accuracy and Min A verbal cues for patient to compare/contrast 2 functional items at the phrase level. 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 With Max A multimodal cues, patient able to demonstrate a louder but hoarse vocal quality at the phoeneme and word level. SLP also facilitated session by providing set-up assist with the suction toothbrush. Patient performed oral care and consumed trials of thin liquids without overt s/s of aspiration. Recommend repeat MBS prior to upgrade. Patient left upright in bed with alarm on, mitt in place and all needs within reach. Continue with current plan of care.     Pain Pain Assessment Pain Scale: Faces Pain Score: 0-No pain Faces Pain Scale: Hurts a little bit Pain Location: Other (Comment)(unable to voice where) Pain Intervention(s): Medication (See eMAR)  Therapy/Group: Individual Therapy  Matthew Black 09/14/2019, 9:29 AM

## 2019-09-14 NOTE — Plan of Care (Signed)
  Problem: Consults Goal: RH STROKE PATIENT EDUCATION Description: See Patient Education module for education specifics  Outcome: Progressing Goal: Nutrition Consult-if indicated Outcome: Progressing   Problem: RH BOWEL ELIMINATION Goal: RH STG MANAGE BOWEL WITH ASSISTANCE Description: STG Manage Bowel with mod Assistance. Outcome: Progressing Goal: RH STG MANAGE BOWEL W/MEDICATION W/ASSISTANCE Description: STG Manage Bowel with Medication with mod Assistance. Outcome: Progressing   Problem: RH SKIN INTEGRITY Goal: RH STG SKIN FREE OF INFECTION/BREAKDOWN Description: Skin to remain free from breakdown while on rehab with min assist. Outcome: Progressing Goal: RH STG MAINTAIN SKIN INTEGRITY WITH ASSISTANCE Description: STG Maintain Skin Integrity With min Assistance. Outcome: Progressing   Problem: RH SAFETY Goal: RH STG ADHERE TO SAFETY PRECAUTIONS W/ASSISTANCE/DEVICE Description: STG Adhere to Safety Precautions With mod Assistance and appropriate assistive Device. Outcome: Progressing   Problem: RH COGNITION-NURSING Goal: RH STG USES MEMORY AIDS/STRATEGIES W/ASSIST TO PROBLEM SOLVE Description: STG Uses Memory Aids and Strategies With mod Assistance to Problem Solve. Outcome: Progressing   Problem: RH PAIN MANAGEMENT Goal: RH STG PAIN MANAGED AT OR BELOW PT'S PAIN GOAL Description: <3 on a 0-10 pain scale Outcome: Progressing   Problem: RH KNOWLEDGE DEFICIT Goal: RH STG INCREASE KNOWLEDGE OF HYPERTENSION Description: Patient and caregiver will demonstrate knowledge of HTN medications, dietary restrictions, and follow up care with the MD post discharge with min assist from rehab staff. Outcome: Progressing Goal: RH STG INCREASE KNOWLEDGE OF DYSPHAGIA/FLUID INTAKE Description: Patient and caregiver will demonstrate knowledge of dysphagia diets and thickened liquids, and follow up care with the MD post discharge with min assist from rehab staff.  Outcome:  Progressing Goal: RH STG INCREASE KNOWLEGDE OF HYPERLIPIDEMIA Description: Patient and caregiver will demonstrate knowledge of HLD medications, dietary restrictions, and follow up care with the MD post discharge with min assist from rehab staff.  Outcome: Progressing Goal: RH STG INCREASE KNOWLEDGE OF STROKE PROPHYLAXIS Description: Patient and caregiver will demonstrate knowledge of Stroke medications, dietary restrictions, and follow up care with the MD post discharge with min assist from rehab staff.  Outcome: Progressing   Problem: RH BLADDER ELIMINATION Goal: RH STG MANAGE BLADDER WITH ASSISTANCE Description: STG Manage Bladder With mod Assistance Outcome: Not Progressing   Problem: RH SKIN INTEGRITY Goal: RH STG ABLE TO PERFORM INCISION/WOUND CARE W/ASSISTANCE Description: STG Able To Perform Incision/Wound Care With min Assistance. Outcome: Not Progressing    Requiring max/total assist for bowel/bladder management at this time.

## 2019-09-14 NOTE — Progress Notes (Signed)
Physical Therapy Weekly Progress Note  Patient Details  Name: KALYN DIMATTIA MRN: [id number] Date of Birth: 08/29/1976  Beginning of progress report period: September 07, 2019 End of progress report period: September 14, 2019  Today's Date: 09/14/2019 PT Individual Time: 1300-1410 PT Individual Time Calculation (min): 70 min   Patient has met 2 of 4 short term goals. Pt has made steady progress towards LTGs. 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 He additionally is more engaged and more meaningfully participates in therapy sessions.  Patient continues to demonstrate the following deficits muscle weakness and muscle joint tightness, decreased cardiorespiratoy endurance, impaired timing and sequencing, abnormal tone, unbalanced muscle activation, decreased coordination and decreased motor planning, decreased midline orientation and decreased attention to left, decreased initiation, decreased attention, decreased awareness, decreased problem solving, decreased safety awareness, decreased memory and delayed processing and decreased sitting balance, decreased standing balance, decreased postural control, hemiplegia and decreased balance strategies and therefore will continue to benefit from skilled PT intervention to increase functional independence with mobility.  Patient progressing toward long term goals..  Continue plan of care.  PT Short Term Goals Week 1:  PT Short Term Goal 1 (Week 1): Pt will transfer bed<>chair w/ only +1 assist PT Short Term Goal 1 - Progress (Week 1): Progressing toward goal PT Short Term Goal 2 (Week 1): Pt will perform bed mobility w/ +1 assist PT Short Term Goal 2 - Progress (Week 1): Progressing toward goal PT Short Term Goal 3 (Week 1): Pt will tolerate 30 min of  upright activity w/o increase in fatigue PT Short Term Goal 3 - Progress (Week 1): Met PT Short Term Goal 4 (Week 1): Pt will tolerate 60 min of sitting up in w/c in between therapy sessions PT Short Term Goal 4 - Progress (Week 1): Met Week 2:  PT Short Term Goal 1 (Week 2): Pt will maintain midline orientation during functional mobility w/ min cues 50% of the time PT Short Term Goal 2 (Week 2): Pt will transfer bed<>chair w/ +1 assist PT Short Term Goal 3 (Week 2): Pt will perform sit<>stand w/ max assist +1 PT Short Term Goal 4 (Week 2): Pt will perform bed mobility w/ +1 assist  Skilled Therapeutic Interventions/Progress Updates:   Pt in TIS and agreeable to therapy, confirms he has a headache but does not rate - RN present to provide pain medication. Pt utilizing head nods and occasional verbalizations to communicate this session. Verbalizations tend to be 1-word sentences and very low volume. Sat upright in TIS to eat lunch, pt holding head up 90% of time, although unable to reach full neutral cervical extension/retraction 2/2 weakness and tightness. Verbal and tactile cues to attend to L side of tray. Total assist w/c transport to/from therapy gym. Worked on midline and postural control while standing in stedy and while supported in stedy. Sit<>stand x3 reps from w/c w/ max assist +2, able to reach and maintain almost full upright stance w/ max assist +1. Mirror for visual feedback and verbal, tactile, and manual cues for midline and to reach full trunk upright. Supported rest in stedy briefly, however rest breaks primarily while seated in w/c. Adjusted pt's TIS headrest to promote increased neck extension while seated, provided w/ hot pack to upper trap musculature while therapist performed passive LLE hamstring  stretching and then glut w/ knee-to-chest. Tactile and verbal cues at head to reach neutral extension and rotation while pt reclined during stretching. Pt w/ increased tone in all LLE  posterior chain musculature. Returned to room and performed slide board total +2 transfer back to EOB and +2 sit>supine. Ended session in supine, all needs in reach.   Therapy Documentation Precautions:  Precautions Precautions: Fall, Other (comment) Precaution Comments: crani helmet when OOB Required Braces or Orthoses: Other Brace Restrictions Weight Bearing Restrictions: No Vital Signs: Therapy Vitals Temp: 98.5 F (36.9 C) Pulse Rate: 72 Resp: 18 BP: 121/84 Patient Position (if appropriate): Lying Oxygen Therapy SpO2: 100 % O2 Device: Room Air  Therapy/Group: Individual Therapy  Cherie Lasalle K Girtha Kilgore 09/14/2019, 2:12 PM

## 2019-09-14 NOTE — Progress Notes (Signed)
[place] PHYSICAL MEDICINE & REHABILITATION PROGRESS NOTE   Subjective/Complaints: Up in bed. Occasional headache. No new problems.   ROS: limited due to language/communication   Objective:   No results found. Recent Labs    09/14/19 0444  WBC 5.2  HGB 11.3*  HCT 34.5*  PLT 202   Recent Labs    09/12/19 0417 09/14/19 0444  NA 140 140  K 3.5 3.3*  CL 105 107  CO2 25 24  GLUCOSE 97 100*  BUN 7 6  CREATININE 0.64 0.72  CALCIUM 9.0 9.2    Intake/Output Summary (Last 24 hours) at 09/14/2019 1213 Last data filed at 09/14/2019 0900 Gross per 24 hour  Intake 980 ml  Output 850 ml  Net 130 ml     Physical Exam: Vital Signs Blood pressure 123/87, pulse 83, temperature 99 F (37.2 C), temperature source Oral, resp. rate 18, height 5\' 11"  (1.803 m), weight 88.8 kg, SpO2 100 %. Constitutional: No distress . Vital signs reviewed. HEENT: EOMI, oral membranes moist Neck: supple Cardiovascular: RRR without murmur. No JVD    Respiratory: CTA Bilaterally without wheezes or rales. Normal effort    GI: BS +, non-tender, non-distended   Skin: area of hypergranulation tissue on posterior aspect of crani incision. Scant drainage this morning Psych: flat Musc: No edema in extremities.  No tenderness in extremities. Neuro: Alert, slow to engage still 0/5 LUE and LLE. Left inattention RUE/RLE appear to be 5/5 proximal distal   Assessment/Plan: 1. 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  Physiatrist is providing close team supervision and 24 hour management of active medical problems listed below.  Physiatrist and rehab team continue to assess barriers to discharge/monitor patient progress toward functional and medical goals  Care Tool:  Bathing    Body parts bathed by patient: Right upper leg, Face, Front perineal area, Chest   Body parts bathed by  helper: Right arm, Left arm, Abdomen, Buttocks, Left upper leg, Right lower leg, Left lower leg, Face     Bathing assist Assist Level: Total Assistance - Patient < 25%     Upper Body Dressing/Undressing Upper body dressing   What is the patient wearing?: Hospital gown only    Upper body assist Assist Level: Dependent - Patient 0%    Lower Body Dressing/Undressing Lower body dressing      What is the patient wearing?: Incontinence brief     Lower body assist Assist for lower body dressing: Total Assistance - Patient < 25%     Toileting Toileting Toileting Activity did not occur Landscape architect and hygiene only): N/A (no void or bm)  Toileting assist Assist for toileting: 2 Helpers     Transfers Chair/bed transfer  Transfers assist     Chair/bed transfer assist level: 2 Helpers     Locomotion Ambulation   Ambulation assist   Ambulation activity did not occur: Safety/medical concerns          Walk 10 feet activity   Assist  Walk 10 feet activity did not occur: Safety/medical concerns        Walk 50 feet activity   Assist Walk 50 feet with 2 turns activity did not occur: Safety/medical concerns         Walk 150 feet activity   Assist Walk 150 feet activity did not occur: Safety/medical concerns         Walk 10 feet on uneven  surface  activity   Assist Walk 10 feet on uneven surfaces activity did not occur: Safety/medical concerns         Wheelchair     Assist Will patient use wheelchair at discharge?: Yes Type of Wheelchair: Manual Wheelchair activity did not occur: Safety/medical concerns         Wheelchair 50 feet with 2 turns activity    Assist    Wheelchair 50 feet with 2 turns activity did not occur: Safety/medical concerns       Wheelchair 150 feet activity     Assist  Wheelchair 150 feet activity did not occur: Safety/medical concerns       Blood pressure 123/87, pulse 83, temperature 99  F (37.2 C), temperature source Oral, resp. rate 18, height 5\' 11"  (1.803 m), weight 88.8 kg, SpO2 100 %. 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 Antithrombotics: -DVT/anticoagulation:Right peroneal, left popliteal, left posterior tibial and left peroneal DVT. Status post IVC filter 07/21/2019 with plan for retrieval 8 to 12 weeks. Patient currently remains on subcutaneous heparin. No plan for long-term anticoagulation -antiplatelet therapy: N/A 3. Pain Management:Tramadol, tylenol as needed 4. Mood:Provide motional support -antipsychotic agents: N/A 5. Neuropsych: This patientis notcapable of making decisions on hisown behalf. 6. Skin/Wound Care:Routine skin checks  -dress scalp prn 7. Fluids/Electrolytes/Nutrition:Routine in and outs.  Hypokalemia-potassium 3.4 on 10/16 ---> 3.3 today. Con Supplement  -mild transaminitis-LFT's continue to normalize  -prealbumin 26.0 10/19 8. Post stroke dysphagia. Dysphagia #2withnectar liquids. Follow-up speech therapy -needs cueing/assistance with meals 9. Seizure prophylaxis. Keppra 500 mg twice daily 10. Cerebral abscess/empyema/cerebritis. Cultures no growth to date. Continue IV vancomycin and cefepime 08/20/2019 x 6 to 8 weeks per infectious disease.  Vanc dosing per pharmacy  -scalp wound with pus-like drainage---sample with PMN's no organisms, culture pending  -wbc's 5.2  -afebrile today 11. Pseudomonas UTI. Zosyn completed 12. Hypertension with tachycardia. Lopressor 50 mg every 8 hours.    13. History of tobacco marijuana use. Urine drug screen  positive marijuana. Provide counseling with family 75.  Sleep disturbance  Showing some improvement     LOS: 8 days A FACE TO FACE EVALUATION WAS PERFORMED  Meredith Staggers 09/14/2019, 12:13 PM

## 2019-09-15 ENCOUNTER — Encounter (HOSPITAL_COMMUNITY): Payer: Medicaid Other | Admitting: Speech Pathology

## 2019-09-15 ENCOUNTER — Inpatient Hospital Stay (HOSPITAL_COMMUNITY): Payer: Medicaid Other | Admitting: Physical Therapy

## 2019-09-15 ENCOUNTER — Inpatient Hospital Stay (HOSPITAL_COMMUNITY): Payer: Medicaid Other

## 2019-09-15 ENCOUNTER — Inpatient Hospital Stay (HOSPITAL_COMMUNITY): Payer: Medicaid Other | Admitting: Speech Pathology

## 2019-09-15 NOTE — Patient Care Conference (Signed)
Inpatient RehabilitationTeam Conference and Plan of Care Update Date: 09/13/2019   Time: 10:55 AM    Patient Name: Matthew Black      Medical Record Number: [id number]  Date of Birth: [birth year] Sex: Male         Room/Bed: 4W14C/4W14C-01 Payor Info: Payor: MEDICAID [place] / Plan: MEDICAID OF [place] / Product Type: *No Product type* /    Admit Date/Time:  09/06/2019  3:37 PM  Primary Diagnosis:  Right middle cerebral artery stroke [facility])  Patient Active Problem List   Diagnosis Date Noted  . Sleep disturbance   . Dysphagia, post-stroke   . Transaminitis   . 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 Cerebral abscess   . Urinary tract infection without hematuria   . Altered mental status   . Primary hypercoagulable state ([place])   . Acute pulmonary embolism without acute cor pulmonale (HCC)   . Deep vein thrombosis (DVT) of non-extremity vein   . Hypokalemia   . FUO (fever of unknown origin)   . Acute blood loss anemia   . SIRS (systemic inflammatory response syndrome) (HCC)   . Leukocytosis   . Endotracheal tube present   . Acute respiratory failure with hypoxemia ([place])   . Stroke (cerebrum) ([place]) 07/19/2019  . Pressure injury of skin 07/19/2019  . Acute CVA (cerebrovascular accident) ([place])   . Encephalopathy   . Dysphagia   . Acute encephalopathy   . Essential hypertension   . Annual physical exam 09/11/2016  . Obesity 03/12/2016  . PCP NOTES >>>>>>>>>>>>>>>>>. 03/12/2016  . Scalp abscess 12/20/2015  . Neck abscess 12/20/2015  . Pilonidal cyst 02/08/2013    Expected Discharge Date: Expected Discharge Date: (3 weeks)  Team Members Present: Physician leading conference: Dr. Alger Simons Social Worker Present: Lennart Pall, LCSW Nurse Present: Romilda Garret, LPN PT Present: Burnard Bunting, PT OT Present: Cherylynn Ridges, OT SLP Present: Weston Anna, SLP PPS Coordinator present : Gunnar Fusi, SLP     Current Status/Progress Goal Weekly Team Focus   Bowel/Bladder   Incontinent b/b. LBM 09/12/2019  Regain continence, skin remain free of infection and breakdown  timed toileting, assess toileting needs q shift/prn   Swallow/Nutrition/ Hydration   Dys. 2 textures with nectar-thick liquids, Mod A  Supervision  trials of upgraded textures/liquids   ADL's   Total A +2 overall.  Mod A  trunk control, sitting balance, slideboard transfers, pusher syndrom, self-care retraining.   Mobility   max-total assist +2 overall, stedy or slide board transfer, heavy L pusher and dense L hemi  mod assist overall, w/c level  sitting balance, postural control, midline orientation, transfers and bed mobility   Communication   Max-Total A  Supervision-Min A  use of multimodal communication, following commands, vocalizing/verbalizing on command   Safety/Cognition/ Behavioral Observations  Max A  Min A  sustained attention, scanning/attention to left   Pain   Pt denies pain  0/10  assess pain q shift/prn. Medicate as ordered   Skin   Abscess R side of head draining thick yellow drainage. No odor.  skin remain free of breakdown and infection.  Cleanse area prn, assess q shift.    Rehab Goals Patient on target to meet rehab goals: Yes *See Care Plan and progress notes for long and short-term goals.     Barriers to Discharge  Current Status/Progress Possible Resolutions Date Resolved   Nursing  Decreased caregiver support;Medical stability;Home environment access/layout;Incontinence;Inaccessible home environment;Lack of/limited family support;Medication compliance  PT  Inaccessible home environment;Incontinence  have already discussed w/ wife the need to build a ramp              OT                  SLP                SW                Discharge Planning/Teaching Needs:  Pt to return home with wife as primary caregiver with additional assistance from children and extended family.  ONgoing   Team Discussion:  Large R MCA CVA with  crani;  Wound infection. Need to recheck drainage culture (already on abx).  Total A +2 overall to some max A.  In tilt-in-space w/c.  D2, nectar.  Writing notes with ~75% accuracy but continue to encourage more vocalization.  Goals set for mod assist w/c level  Revisions to Treatment Plan:  NA    Medical Summary Current Status: right mca infarct with dense left hemiparesis. cerebritis/cerebral abscess. Weekly Focus/Goal: wound care, ID considerations, see medical problem list  Barriers to Discharge: Medical stability       Continued Need for Acute Rehabilitation Level of Care: The patient requires daily medical management by a physician with specialized training in physical medicine and rehabilitation for the following reasons: Direction of a multidisciplinary physical rehabilitation program to maximize functional independence : Yes Medical management of patient stability for increased activity during participation in an intensive rehabilitation regime.: Yes Analysis of laboratory values and/or radiology reports with any subsequent need for medication adjustment and/or medical intervention. : Yes   I attest that I was present, lead the team conference, and concur with the assessment and plan of the team.   Lennart Pall 09/15/2019, 2:51 PM   Team conference was held via web/ teleconference due to [place] - 19

## 2019-09-15 NOTE — Progress Notes (Signed)
Occupational Therapy Session Note  Patient Details  Name: Matthew Black MRN: [id number] Date of Birth: 05-19-1976  Today's Date: 09/15/2019 OT Individual Time: 1300-1330 OT Individual Time Calculation (min): 30 min    Short Term Goals: Week 2:  OT Short Term Goal 1 (Week 2): Pt will maintain sitting balance with max A of 1 person OT Short Term Goal 2 (Week 2): Pt will complete UB dressing with max A of 1 OT Short Term Goal 3 (Week 2): Pt will locate grooming items on L side of the sink with mod verbal cues  Skilled Therapeutic Interventions/Progress Updates:    Pt greeted tilted in TIS wc and had been incontinent of urine. Nurse tech provided +2 assistance for slidboard transfer back to bed with improved initiation to scoot to the stronger R side, but still needed max A +2. Pt needed max A to roll to the R, but min A to roll to the L for total A peri-care and brief change. Worked on LB dressing with total A to dress L side, but pt able to lift R LE to help thread R leg. Pt also able to bridge on R side to help pull pants up on R hip. Pt left semi-reclined in bed with handoff to SLP for next therapy session.   Therapy Documentation Precautions:  Precautions Precautions: Fall, Other (comment) Precaution Comments: crani helmet when OOB Required Braces or Orthoses: Other Brace Restrictions Weight Bearing Restrictions: No Pain: Denies pain reports feeling cold  Therapy/Group: Individual Therapy  Valma Cava 09/15/2019, 1:35 PM

## 2019-09-15 NOTE — Plan of Care (Signed)
  Problem: Consults Goal: RH STROKE PATIENT EDUCATION Description: See Patient Education module for education specifics  Outcome: Progressing Goal: Nutrition Consult-if indicated Outcome: Progressing   Problem: RH BOWEL ELIMINATION Goal: RH STG MANAGE BOWEL WITH ASSISTANCE Description: STG Manage Bowel with mod Assistance. Outcome: Progressing Goal: RH STG MANAGE BOWEL W/MEDICATION W/ASSISTANCE Description: STG Manage Bowel with Medication with mod Assistance. Outcome: Progressing   Problem: RH SKIN INTEGRITY Goal: RH STG SKIN FREE OF INFECTION/BREAKDOWN Description: Skin to remain free from breakdown while on rehab with min assist. Outcome: Progressing Goal: RH STG MAINTAIN SKIN INTEGRITY WITH ASSISTANCE Description: STG Maintain Skin Integrity With min Assistance. Outcome: Progressing   Problem: RH SAFETY Goal: RH STG ADHERE TO SAFETY PRECAUTIONS W/ASSISTANCE/DEVICE Description: STG Adhere to Safety Precautions With mod Assistance and appropriate assistive Device. Outcome: Progressing   Problem: RH COGNITION-NURSING Goal: RH STG USES MEMORY AIDS/STRATEGIES W/ASSIST TO PROBLEM SOLVE Description: STG Uses Memory Aids and Strategies With mod Assistance to Problem Solve. Outcome: Progressing   Problem: RH PAIN MANAGEMENT Goal: RH STG PAIN MANAGED AT OR BELOW PT'S PAIN GOAL Description: <3 on a 0-10 pain scale Outcome: Progressing   Problem: RH KNOWLEDGE DEFICIT Goal: RH STG INCREASE KNOWLEDGE OF HYPERTENSION Description: Patient and caregiver will demonstrate knowledge of HTN medications, dietary restrictions, and follow up care with the MD post discharge with min assist from rehab staff. Outcome: Progressing Goal: RH STG INCREASE KNOWLEDGE OF DYSPHAGIA/FLUID INTAKE Description: Patient and caregiver will demonstrate knowledge of dysphagia diets and thickened liquids, and follow up care with the MD post discharge with min assist from rehab staff.  Outcome:  Progressing Goal: RH STG INCREASE KNOWLEGDE OF HYPERLIPIDEMIA Description: Patient and caregiver will demonstrate knowledge of HLD medications, dietary restrictions, and follow up care with the MD post discharge with min assist from rehab staff.  Outcome: Progressing Goal: RH STG INCREASE KNOWLEDGE OF STROKE PROPHYLAXIS Description: Patient and caregiver will demonstrate knowledge of Stroke medications, dietary restrictions, and follow up care with the MD post discharge with min assist from rehab staff.  Outcome: Progressing

## 2019-09-15 NOTE — Progress Notes (Signed)
Social Work Patient ID: Matthew Black, male   DOB: [birth year], 43 y.o.   MRN: [id number]  Met with pt and wife yesterday afternoon to review team conference.  Pt listening to information.  Wife aware and agreeable with targeted LOS of 3 week and mod assist w/c level goals overall.  Pt smiling and attempts to vocalize short phrases.  Re: his children, pt states, "I miss them."  Providing encouragement to both and praise for the vocalizations!  Will continue to follow.  Ramonita Koenig

## 2019-09-15 NOTE — Progress Notes (Signed)
Patients wife concerned that the abscessed area on patients right crani incision is increasing in size.  She is asking if neurosurgery is aware of this continued problem.  Notified Algis Liming, PA who has assessed his wound previously, asked her to come reassess and photograph the area for future reference.  As Pam was measuring area, the light pressure from using the measuring tape caused some yellowish drainage to come out.  Patient and significant other satisfied with care provided.  Brita Romp, RN

## 2019-09-15 NOTE — Progress Notes (Signed)
Occupational Therapy Session Note  Patient Details  Name: Matthew Black MRN: [id number] Date of Birth: [birth year]  Today's Date: 09/15/2019 OT Individual Time: 1045-1200 OT Individual Time Calculation (min): 75 min    Short Term Goals: Week 2:  OT Short Term Goal 1 (Week 2): Pt will maintain sitting balance with max A of 1 person OT Short Term Goal 2 (Week 2): Pt will complete UB dressing with max A of 1 OT Short Term Goal 3 (Week 2): Pt will locate grooming items on L side of the sink with mod verbal cues  Skilled Therapeutic Interventions/Progress Updates:    Pt resting in bed upon arrival.  OT intervention with focus on bed mobility, sitting balance, BADL retraining, functional transfers with slide board, following one step commands, problem solving, attention to L, and activity tolerance to increase independence with BADLs. Pt initially engaged in rolling in bed R<>L to facilitate changing brief and perineal cleansing. Pt rolls to L with CGA and min A to R with assist to manage LUE and LLE. Pt washed front perineal area when presented with wash cloth but required assistance to bathe buttocks. Supine>sit EOB with max A and max A initially for sitting balance EOB fading to min A. Max A for slide board transfer to w/c.  Pt engaged in UB bathing tasks seated in w/c. Pt very meticulous with washing face but did not initiate bathing remainder of UB. Pt transitioned to gym and engaged in table task with colored peg board. Pt presented with simple peg board pattern with alternating colors.  Pt supplied with correct pegs. Pt required max verbal cues to place three pegs on board in 15 minutes.  Pt required max verbal cues to locate pegs L of midline. Pt returned to room and remained in TIS-belt alarm activated, lap board in place, and RUE mitt donned. All needs within reach.   Therapy Documentation Precautions:  Precautions Precautions: Fall, Other (comment) Precaution Comments: crani helmet when  OOB Required Braces or Orthoses: Other Brace Restrictions Weight Bearing Restrictions: No Pain: Pt with no s/s of pain  Therapy/Group: Individual Therapy  Leroy Libman 09/15/2019, 12:02 PM

## 2019-09-15 NOTE — Progress Notes (Signed)
Physical Therapy Session Note  Patient Details  Name: Matthew Black MRN: [id number] Date of Birth: 04-14-1976  Today's Date: 09/15/2019 PT Individual Time: 1415-1515 PT Individual Time Calculation (min): 60 min   Short Term Goals: Week 2:  PT Short Term Goal 1 (Week 2): Pt will maintain midline orientation during functional mobility w/ min cues 50% of the time PT Short Term Goal 2 (Week 2): Pt will transfer bed<>chair w/ +1 assist PT Short Term Goal 3 (Week 2): Pt will perform sit<>stand w/ max assist +1 PT Short Term Goal 4 (Week 2): Pt will perform bed mobility w/ +1 assist  Skilled Therapeutic Interventions/Progress Updates:   Pt in supine and agreeable to therapy, no c/o or evidence of pain throughout session. Wife present for duration of session as well. Supine>sit w/ total assist +1 and maintained static sitting balance for 10 min w/ max assist fading to min-mod assist towards end of bout. Verbal, tactile, and visual cues for full upright posture and to bring head to neutral cervical extension. Slide board transfer to TIS on pt's R w/ total assist +1, however pt actively participating ~20%. Tactile and verbal cues for RUE placement and to boost up w/ each scoot. Total assist w/c transport to/from therapy gym. Slide board transfer to mat w/ total assist +1. Worked on static and dynamic sitting balance w/ RUE reaching tasks to toss horseshoes. Emphasis on crossing midline, maintaining midline, and reaching upright trunk and cervical positioning. CGA-min assist for static sitting balance, mod-max for dynamic and tactile and verbal cues at trunk musculature to return to midline. Supine rest w/ head supported on pillow, active cervical stretching to neutral extension and L rotation w/ slight overpressure 1 min x3 reps. Stand pivot back to TIS on pt's R w/ max assist. Tactile and verbal cues for sequencing, technique, and RUE placement. Returned to room and ended session in [place] and in care of wife,  all needs in reach. No alarm belt or RUE mitt, wife verbalizes understanding of importance of notifying RN prior to leaving pt. Heat applied to upper trap musculature for soreness and muscle relaxation. Returned 20 min later to remove hot pack w/ no adverse effects.   Therapy Documentation Precautions:  Precautions Precautions: Fall, Other (comment) Precaution Comments: crani helmet when OOB Required Braces or Orthoses: Other Brace Restrictions Weight Bearing Restrictions: No  Therapy/Group: Individual Therapy  Raydon Chappuis Clent Demark 09/15/2019, 3:28 PM

## 2019-09-15 NOTE — Progress Notes (Signed)
Speech Language Pathology Daily Session Note  Patient Details  Name: Matthew Black MRN: [id number] Date of Birth: Sep 20, 1976  Today's Date: 09/15/2019 SLP Individual Time: 1330-1400 SLP Individual Time Calculation (min): 30 min  Short Term Goals: Week 2: SLP Short Term Goal 1 (Week 2): Patient will consume trials of thin liquids without overt s/s of aspiration and Min verbal cues over 2 sessions to assess readiness for repeat MBS. SLP Short Term Goal 2 (Week 2): Patient will demonstrate efficient mastication with complete oral clearance with trials of Dys. 3 textures with Min verbal cues over 2 sessions prior to upgrade. SLP Short Term Goal 3 (Week 2): Patient will vocalize on command in 75% of opportunities with Max A multimodal cues. SLP Short Term Goal 4 (Week 2): Patient will demonstrate sustained attention to tasks for ~5 minutes with Max verbal cues for redirection. SLP Short Term Goal 5 (Week 2): Patient will scan to midline/left field of enviornment ot locate functional items with Mod A multimodal cues. SLP Short Term Goal 6 (Week 2): Patient will demonstrate basic problem solving with Max A multimodal cues during functional tasks.  Skilled Therapeutic Interventions: Skilled treatment session focused on dysphagia goals and education with the patient's wife. SLP facilitated session by providing Min A verbal cues for use of small bites and to self-monitor and correct left anterior spillage with lunch meal of Dys. 2 textures with thin liquids. No overt s/s of aspirating noted, therefore, recommend patient continue current diet. Patient's wife present and educated in regards to patient's current swallowing function, diet recommendations, appropriate textures and compensatory strategies. She verbalized understanding and all questions were answered. Patient left upright in bed with alarm on and all needs within reach. Continue with current plan of care.       Pain No/Denies Pain    Therapy/Group: Individual Therapy  Havah Ammon 09/15/2019, 3:51 PM

## 2019-09-15 NOTE — Progress Notes (Signed)
[facility] PHYSICAL MEDICINE & REHABILITATION PROGRESS NOTE   Subjective/Complaints: Up in bed. Says he has occasional headache. Wound drained last night?  ROS: Limited due to cognitive/behavioral    Objective:   No results found. Recent Labs    09/14/19 0444  WBC 5.2  HGB 11.3*  HCT 34.5*  PLT 202   Recent Labs    09/14/19 0444  NA 140  K 3.3*  CL 107  CO2 24  GLUCOSE 100*  BUN 6  CREATININE 0.72  CALCIUM 9.2    Intake/Output Summary (Last 24 hours) at 09/15/2019 1045 Last data filed at 09/15/2019 0816 Gross per 24 hour  Intake 240 ml  Output 550 ml  Net -310 ml     Physical Exam: Vital Signs Blood pressure 130/90, pulse 79, temperature 99.5 F (37.5 C), resp. rate 18, height 5\' 11"  (1.803 m), weight 88.8 kg, SpO2 100 %. Constitutional: No distress . Vital signs reviewed. HEENT: EOMI, oral membranes moist Neck: supple Cardiovascular: RRR without murmur. No JVD    Respiratory: CTA Bilaterally without wheezes or rales. Normal effort    GI: BS +, non-tender, non-distended  Skin: area of hypergranulation tissue on posterior aspect of crani incision. No scalp drainage. Psych: flat Musc: No edema in extremities.  No tenderness in extremities. Neuro: Alert, engaging more. Speaking but dysphonic 0/5 LUE and LLE. Left inattention RUE/RLE appear to be 5/5 proximal distal   Assessment/Plan: 1. 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  Physiatrist is providing close team supervision and 24 hour management of active medical problems listed below.  Physiatrist and rehab team continue to assess barriers to discharge/monitor patient progress toward functional and medical goals  Care Tool:  Bathing    Body parts bathed by patient: Right upper leg, Face, Front perineal area, Chest   Body parts bathed by helper: Right arm, Left arm, Abdomen,  Buttocks, Left upper leg, Right lower leg, Left lower leg, Face     Bathing assist Assist Level: Total Assistance - Patient < 25%     Upper Body Dressing/Undressing Upper body dressing   What is the patient wearing?: Hospital gown only    Upper body assist Assist Level: Dependent - Patient 0%    Lower Body Dressing/Undressing Lower body dressing      What is the patient wearing?: Incontinence brief     Lower body assist Assist for lower body dressing: Total Assistance - Patient < 25%     Toileting Toileting Toileting Activity did not occur Landscape architect and hygiene only): N/A (no void or bm)  Toileting assist Assist for toileting: 2 Helpers     Transfers Chair/bed transfer  Transfers assist     Chair/bed transfer assist level: 2 Helpers     Locomotion Ambulation   Ambulation assist   Ambulation activity did not occur: Safety/medical concerns          Walk 10 feet activity   Assist  Walk 10 feet activity did not occur: Safety/medical concerns        Walk 50 feet activity   Assist Walk 50 feet with 2 turns activity did not occur: Safety/medical concerns         Walk 150 feet activity   Assist Walk 150 feet activity did not occur: Safety/medical concerns         Walk 10 feet on uneven surface  activity   Assist Walk 10 feet on  uneven surfaces activity did not occur: Safety/medical concerns         Wheelchair     Assist Will patient use wheelchair at discharge?: Yes Type of Wheelchair: Manual Wheelchair activity did not occur: Safety/medical concerns         Wheelchair 50 feet with 2 turns activity    Assist    Wheelchair 50 feet with 2 turns activity did not occur: Safety/medical concerns       Wheelchair 150 feet activity     Assist  Wheelchair 150 feet activity did not occur: Safety/medical concerns       Blood pressure 130/90, pulse 79, temperature 99.5 F (37.5 C), resp. rate 18, height  5\' 11"  (1.803 m), weight 88.8 kg, SpO2 100 %. 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 Antithrombotics: -DVT/anticoagulation:Right peroneal, left popliteal, left posterior tibial and left peroneal DVT. Status post IVC filter 07/21/2019 with plan for retrieval 8 to 12 weeks. Patient currently remains on subcutaneous heparin. No plan for long-term anticoagulation -antiplatelet therapy: N/A 3. Pain Management:Tramadol, tylenol as needed 4. Mood:Provide motional support -antipsychotic agents: N/A 5. Neuropsych: This patientis notcapable of making decisions on hisown behalf.  -ritalin initiated for arousal/initiation 6. Skin/Wound Care:Routine skin checks  -dress scalp prn 7. Fluids/Electrolytes/Nutrition:Routine in and outs.  Hypokalemia-potassium 3.4 on 10/16 ---> 3.3 today. Con Supplement  -mild transaminitis-LFT's continue to normalize  -prealbumin 26.0 10/19 8. Post stroke dysphagia. Dysphagia #2withnectar liquids. Follow-up speech therapy -needs cueing/assistance with meals 9. Seizure prophylaxis. Keppra 500 mg twice daily 10. Cerebral abscess/empyema/cerebritis. Cultures no growth to date. Continue IV vancomycin and cefepime 08/20/2019 x 6 to 8 weeks per infectious disease.  Vanc dosing per pharmacy  -scalp wound   culture pending   -wbc's 5.2   -afebrile  11. Pseudomonas UTI. Zosyn completed 12. Hypertension with tachycardia. Lopressor 50 mg every 8 hours.    13. History of tobacco marijuana use. Urine drug screen positive marijuana. Provide counseling with family 15.  Sleep  disturbance  Showing some improvement     LOS: 9 days A FACE TO FACE EVALUATION WAS PERFORMED  Meredith Staggers 09/15/2019, 10:45 AM

## 2019-09-15 NOTE — Progress Notes (Signed)
Modified Barium Swallow Progress Note  Patient Details  Name: Matthew Black MRN: [id number] Date of Birth: Jan 26, 1976  Today's Date: 09/15/2019  Modified Barium Swallow completed.  Full report located under Chart Review in the Imaging Section.  Brief recommendations include the following:  Clinical Impression  Patient demonstrates a mild oropharyngeal dysphagia. Oral phase is characterized by reduced labial seal, prolonged mastication and AP transit with solid textures. Patient consistently triggers his swallow at the pyriform sinuses with thin liquids resulting in flash penetration with large, consecutive sips via straw.  Suspect difficulty is due to decreased timing/coordination from difficulty managing the straw due to labial weakness. No pharyngeal residue noted throughout study.  Recommend patient continue Dys. 2 textures and upgrade to thin liquids.   Swallow Evaluation Recommendations       SLP Diet Recommendations: Dysphagia 2 (Fine chop) solids;Thin liquid   Liquid Administration via: Cup;No straw   Medication Administration: Crushed with puree   Supervision: Full supervision/cueing for compensatory strategies;Patient able to self feed   Compensations: Minimize environmental distractions;Slow rate;Small sips/bites;Lingual sweep for clearance of pocketing   Postural Changes: Seated upright at 90 degrees   Oral Care Recommendations: Oral care BID        Pat Elicker 09/15/2019,3:50 PM

## 2019-09-16 ENCOUNTER — Inpatient Hospital Stay (HOSPITAL_COMMUNITY): Payer: Medicaid Other | Admitting: Physical Therapy

## 2019-09-16 ENCOUNTER — Inpatient Hospital Stay (HOSPITAL_COMMUNITY): Payer: Medicaid Other | Admitting: Speech Pathology

## 2019-09-16 ENCOUNTER — Inpatient Hospital Stay (HOSPITAL_COMMUNITY): Payer: Medicaid Other | Admitting: Occupational Therapy

## 2019-09-16 ENCOUNTER — Inpatient Hospital Stay (HOSPITAL_COMMUNITY): Payer: Medicaid Other

## 2019-09-16 LAB — AEROBIC CULTURE W GRAM STAIN (SUPERFICIAL SPECIMEN): Culture: NO GROWTH

## 2019-09-16 LAB — BASIC METABOLIC PANEL
Anion gap: 10 (ref 5–15)
BUN: 7 mg/dL (ref 6–20)
CO2: 24 mmol/L (ref 22–32)
Calcium: 9.2 mg/dL (ref 8.9–10.3)
Chloride: 104 mmol/L (ref 98–111)
Creatinine, Ser: 0.62 mg/dL (ref 0.61–1.24)
GFR calc Af Amer: >60 mL/min (ref >60)
GFR calc non Af Amer: >60 mL/min (ref >60)
Glucose, Bld: 94 mg/dL (ref 70–99)
Potassium: 3.4 mmol/L — ABNORMAL LOW (ref 3.5–5.1)
Sodium: 138 mmol/L (ref 135–145)

## 2019-09-16 LAB — VANCOMYCIN, RANDOM: Vancomycin Rm: 12

## 2019-09-16 LAB — VANCOMYCIN, TROUGH: Vancomycin Tr: 49 ug/mL (ref 15–20)

## 2019-09-16 MED ORDER — VANCOMYCIN HCL 10 G IV SOLR
1500.0000 mg | Freq: Two times a day (BID) | INTRAVENOUS | Status: DC
  Administered 2019-09-16 – 2019-10-06 (×40): 1500 mg via INTRAVENOUS
  Filled 2019-09-16 (×42): qty 1500

## 2019-09-16 MED ORDER — VANCOMYCIN VARIABLE DOSE PER UNSTABLE RENAL FUNCTION (PHARMACIST DOSING): Status: DC

## 2019-09-16 MED ORDER — CHLORHEXIDINE GLUCONATE CLOTH 2 % EX PADS
6.0000 | MEDICATED_PAD | Freq: Two times a day (BID) | CUTANEOUS | Status: DC
  Administered 2019-09-16 – 2019-09-18 (×4): 6 via TOPICAL

## 2019-09-16 NOTE — Progress Notes (Signed)
Occupational Therapy Session Note  Patient Details  Name: Matthew Black MRN: [id number] Date of Birth: [birth year]  Today's Date: 09/16/2019  Session 1 OT Individual Time: 6269-4854 OT Individual Time Calculation (min): 30 min   Session 2 OT Individual Time: 1300-1400 OT Individual Time Calculation (min): 60 min    Short Term Goals: Week 2:  OT Short Term Goal 1 (Week 2): Pt will maintain sitting balance with max A of 1 person OT Short Term Goal 2 (Week 2): Pt will complete UB dressing with max A of 1 OT Short Term Goal 3 (Week 2): Pt will locate grooming items on L side of the sink with mod verbal cues  Skilled Therapeutic Interventions/Progress Updates:  Session 1 Pt greeted tilted in TIS wc and agreeable to OT treatment session. Pt brought to the sink in wc and worked on visual scanning to the L to locate hot water lever. Pt needed max verbal and tactile cues. Worked on L body awareness to wash L UE and OT provided hand over hand A to bring L UE to wash R UE for neuro re-ed- Increased tone and tightness in pecs today. Pt able to assist with threading L UE into gown sleeve by picking up L arm with R. Pt completed 2 sit<>stands at the sink with mod A and focus on full hip/trunk extension using mirror feedback. OT asked pt where he wanted to stay and said "bed." Stand-pivot back to bed with max A of 1 to the stronger R side. Max+2 to return safely to bed. Pt left semi-reclined in bed with bed alarm on and needs met.   Session 2 Pt greeted semi-reclined in bed, just about to start lunch with nurse tech. OT took over and worked on self-feeding and L attention within feed tasks. Pt able to locate spoon at midline for self-feeding. Ot needed max verbal cues to locate food items on L side of plate, and tended to go back to less preferred food item (carrots) on the R side of plate. OT placed drink cup in L upper quadrant and provided max verbal cues to locate cup on L side. Pt with more  vocalizations today and able to express himself at sentence level today. Pt stated, "I think i'm wet. Is the condom (catheter) still on?" OT brought pt into sidelying on R side and L UE neuro re-ed in gravity eliminated position. OT provided joint input to bring pt through full elbow/wrist/hand/shoulder ROM. Pt with increased tone, noted a lot in pecs. OT provided gentle massage to relax muscles, then bing pt through ROM. OT then placed kinesiotape to L shoulder for subluxation support and pain management. Pt returned to supine and left with L UE supported on pillow and HOB slightly raised. Bed alarm on and needs met.     Therapy Documentation Precautions:  Precautions Precautions: Fall, Other (comment) Precaution Comments: crani helmet when OOB Required Braces or Orthoses: Other Brace Restrictions Weight Bearing Restrictions: No Pain: Pain Assessment Pain Scale: 0-10 Pain Score: 0-No pain   Therapy/Group: Individual Therapy  Valma Cava 09/16/2019, 3:06 PM

## 2019-09-16 NOTE — Progress Notes (Signed)
Pharmacy Antibiotic Note  Matthew Black is a 43 y.o. male transferred from acute to CIR on  09/06/2019.  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  Pharmacy has been consulted to continue dosing/monitoring cefepime and vancomycin for brain abscess/cerebritis.   Pt was started on vancomycin and cefepime on 08/20/19, with planned duration of 6-8 weeks per ID. On transfer to CIR, he was receiving cefepime 2 gm IV Q 8 hrs and vancomycin 1250 mg IV Q 8 hrs, with goal vancomycin trough level of 15-20 mg/L.   10/16 Vancomycin trough = 21 10/19 Vancomycin trough = 8 (no missed doses) - unsure reason for the dramatic drop in trough. Will conservatively increase. 10/23 Vancomycin trough = 49 (drawn correctly)  WBC 5.2, afebrile; Scr 0.6-0.7s, CrCl > 100 ml/min (renal function stable)  Plan: Continue cefepime 2 gm IV Q 8 hrs   Hold Vancomycin.  Draw random Vancomycin level in 12 hours to calculate patient specific kinetics and adjust dose.  Ggoal vanc trough of 15-20 mg/L  Will target higher end of goal range given need for brain penetration. Monitor renal function, WBC, temp, vancomycin levels, cultures, clinical improvement  Height: 5\' 11"  (180.3 cm) Weight: 195 lb 12.3 oz (88.8 kg) IBW/kg (Calculated) : 75.3  Temp (24hrs), Avg:98.7 F (37.1 C), Min:98.5 F (36.9 C), Max:99 F (37.2 C)  Recent Labs  Lab 09/12/19 0417 09/12/19 1330 09/14/19 0444 09/16/19 0445  WBC  --   --  5.2  --   CREATININE 0.64  --  0.72  --   VANCOTROUGH  --  8*  --  49*    Estimated  Creatinine Clearance: 129.4 mL/min (by C-G formula based on SCr of 0.72 mg/dL).    Allergies  Allergen Reactions  . Hydrocodone Nausea Only    Antimicrobials this admission: 9/21 Zosyn >> 9/26 9/26 Cefepime >> (11/20) 9/26 Vancomycin >> (11/20)  Microbiology results: 9/27 BCx X 2: NG/final 9/17  UCx: Pseudomonas aeruginosa (suscept to Zosyn) 9/14  Abdominal fluid: Gram stain: rate WBCs, NOS; cx: NG/final 9/27 Epidural abscess: Gram stain: mod WBCs (PMNs), NOS; cx: NG/final 9/27 MRSA PCR: positive   Thank you for involving pharmacy in this patient's care.  Horton Chin, PharmD, BCPS Clinical Pharmacist Clinical phone for 09/16/2019 until 3p is [phone] 09/16/2019 8:58 AM  **Pharmacist phone directory can be found on [email or web address] listed under [place]**

## 2019-09-16 NOTE — Progress Notes (Addendum)
[facility] PHYSICAL MEDICINE & REHABILITATION PROGRESS NOTE   Subjective/Complaints: Pt up in bed. No new problems. Able to sleep.   ROS: limited d/t cognitive  Objective:   Dg Swallowing Func-speech Pathology  Result Date: 09/15/2019 Objective Swallowing Evaluation: Type of Study: MBS-Modified Barium Swallow Study  Patient Details Name: Matthew Black MRN: [id number] Date of Birth: 1976/05/13 Today's Date: 09/15/2019 Time: SLP Start Time (ACUTE ONLY): [id number] -SLP Stop Time (ACUTE ONLY): 0930 SLP Time Calculation (min) (ACUTE ONLY): 25 min Past Medical History: Past Medical History: Diagnosis Date . 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 CRANIOTOMY Right 07/19/2019  Procedure: RIGHT HEMI-CRANIECTOMY With implantation of skull flap to abdominal wall;  Surgeon: Consuella Lose, MD;  Location: [facility];  Service: Neurosurgery;  Laterality: Right; . CYST EXCISION N/A 10/08/2016  Procedure: EXCISION OF POSTERIOR NECK CYST;  Surgeon: Clovis Riley, MD;  Location: [facility];  Service: General;  Laterality: N/A; . INCISION AND DRAINAGE ABSCESS N/A 09/22/2014  Procedure: INCISION AND DRAINAGE ABSCESS POSTERIOR NECK;  Surgeon: Pedro Earls, MD;  Location: [facility];  Service: General;  Laterality: N/A; . INCISION AND DRAINAGE ABSCESS N/A 12/20/2015  Procedure: INCISION AND DRAINAGE POSTERIOR NECK MASS;  Surgeon: Armandina Gemma, MD;  Location: [facility];  Service: General;  Laterality: N/A; . INCISION AND DRAINAGE ABSCESS Left 07/10/2004  middle finger . IR IVC FILTER PLMT / S&I /IMG GUID/MOD SED  07/21/2019 . IR VENOGRAM RENAL UNI RIGHT  07/21/2019 . MASS EXCISION N/A 07/21/2017  Procedure: EXCISION OF BENIGN NECK LESION WITH LAYERED CLOSURE;  Surgeon: Irene Limbo, MD;  Location: [facility];  Service: Plastics;  Laterality: N/A; . MASS EXCISION N/A 11/10/2017  Procedure: EXCISION BENIGN LESION OF THE NECK WITH LAYERED CLOSURE;  Surgeon:  Irene Limbo, MD;  Location: [facility];  Service: Plastics;  Laterality: N/A; HPI: See H&P  Subjective: Pt seen in radiology for MBS Assessment / Plan / Recommendation CHL IP CLINICAL IMPRESSIONS 09/15/2019 Clinical Impression Patient demonstrates a mild oropharyngeal dysphagia. Oral phase is characterized by reduced labial seal, prolonged mastication and AP transit with solid textures. Patient consistently triggers his swallow at the pyriform sinuses with thin liquids resulting in flash penetration with large, consecutive sips via straw.  Suspect difficulty is due to decreased timing/coordination from difficulty managing the straw due to labial weakness. No pharyngeal residue noted throughout study.  Recommend patient continue Dys. 2 textures and upgrade to thin liquids. SLP Visit Diagnosis Dysphagia, oropharyngeal phase (R13.12) Attention and concentration deficit following -- Frontal lobe and executive function deficit following -- Impact on safety and function Mild aspiration risk   CHL IP TREATMENT RECOMMENDATION 09/15/2019 Treatment Recommendations Therapy as outlined in treatment plan below   Prognosis 09/15/2019 Prognosis for Safe Diet Advancement Good Barriers to Reach Goals Cognitive deficits Barriers/Prognosis Comment -- CHL IP DIET RECOMMENDATION 09/15/2019 SLP Diet Recommendations Dysphagia 2 (Fine chop) solids;Thin liquid Liquid Administration via Cup;No straw Medication Administration Crushed with puree Compensations Minimize environmental distractions;Slow rate;Small sips/bites;Lingual sweep for clearance of pocketing Postural Changes Seated upright at 90 degrees   CHL IP OTHER RECOMMENDATIONS 09/15/2019 Recommended Consults -- Oral Care Recommendations Oral care BID Other Recommendations --   CHL IP FOLLOW UP RECOMMENDATIONS 09/15/2019 Follow up Recommendations Home health SLP   CHL IP FREQUENCY AND DURATION 09/15/2019 Speech Therapy Frequency (ACUTE ONLY) min 5x/week Treatment  Duration 3 weeks      CHL IP ORAL PHASE 09/15/2019 Oral Phase Impaired Oral -  Pudding Teaspoon -- Oral - Pudding Cup -- Oral - Honey Teaspoon -- Oral - Honey Cup -- Oral - Nectar Teaspoon NT Oral - Nectar Cup Left anterior bolus loss Oral - Nectar Straw NT Oral - Thin Teaspoon -- Oral - Thin Cup -- Oral - Thin Straw Left anterior bolus loss Oral - Puree Left anterior bolus loss Oral - Mech Soft Left anterior bolus loss;Delayed oral transit Oral - Regular -- Oral - Multi-Consistency -- Oral - Pill NT Oral Phase - Comment --  CHL IP PHARYNGEAL PHASE 09/15/2019 Pharyngeal Phase Impaired Pharyngeal- Pudding Teaspoon -- Pharyngeal -- Pharyngeal- Pudding Cup -- Pharyngeal -- Pharyngeal- Honey Teaspoon -- Pharyngeal -- Pharyngeal- Honey Cup -- Pharyngeal -- Pharyngeal- Nectar Teaspoon NT Pharyngeal -- Pharyngeal- Nectar Cup Delayed swallow initiation-vallecula Pharyngeal -- Pharyngeal- Nectar Straw NT Pharyngeal -- Pharyngeal- Thin Teaspoon -- Pharyngeal -- Pharyngeal- Thin Cup Delayed swallow initiation-pyriform sinuses Pharyngeal -- Pharyngeal- Thin Straw Penetration/Aspiration during swallow;Delayed swallow initiation-pyriform sinuses Pharyngeal Material enters airway, remains ABOVE vocal cords then ejected out Pharyngeal- Puree Delayed swallow initiation-vallecula Pharyngeal -- Pharyngeal- Mechanical Soft Delayed swallow initiation-vallecula Pharyngeal -- Pharyngeal- Regular -- Pharyngeal -- Pharyngeal- Multi-consistency -- Pharyngeal -- Pharyngeal- Pill NT Pharyngeal -- Pharyngeal Comment --  CHL IP CERVICAL ESOPHAGEAL PHASE 09/15/2019 Cervical Esophageal Phase WFL Pudding Teaspoon -- Pudding Cup -- Honey Teaspoon -- Honey Cup -- Nectar Teaspoon -- Nectar Cup -- Nectar Straw -- Thin Teaspoon -- Thin Cup -- Thin Straw -- Puree -- Mechanical Soft -- Regular -- Multi-consistency -- Pill -- Cervical Esophageal Comment -- Matthew Black, Matthew Black 09/15/2019, 3:54 PM Weston Anna, MA, CCC-SLP [phone]              Recent Labs     09/14/19 0444  WBC 5.2  HGB 11.3*  HCT 34.5*  PLT 202   Recent Labs    09/14/19 0444  NA 140  K 3.3*  CL 107  CO2 24  GLUCOSE 100*  BUN 6  CREATININE 0.72  CALCIUM 9.2    Intake/Output Summary (Last 24 hours) at 09/16/2019 1107 Last data filed at 09/16/2019 0813 Gross per 24 hour  Intake 210 ml  Output 1700 ml  Net -1490 ml     Physical Exam: Vital Signs Blood pressure 119/85, pulse 73, temperature 99 F (37.2 C), temperature source Oral, resp. rate 19, height 5\' 11"  (1.803 m), weight 88.8 kg, SpO2 100 %. Constitutional: No distress . Vital signs reviewed. HEENT: EOMI, oral membranes moist Neck: supple Cardiovascular: RRR without murmur. No JVD    Respiratory: CTA Bilaterally without wheezes or rales. Normal effort    GI: BS +, non-tender, non-distended  Skin: area of hypergranulation tissue on posterior aspect of crani incision. No scalp drainage evident today. Psych: flat but does engage with cueing Musc: No edema in extremities.  No tenderness in extremities. Neuro: Alert, engaging more. Speaking but dysphonic 0/5 LUE and LLE. Left inattention persistent RUE/RLE appear to be 5/5 proximal distal   Assessment/Plan: 1. 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  Physiatrist is providing close team supervision and 24 hour management of active medical problems listed below.  Physiatrist and rehab team continue to assess barriers to discharge/monitor patient progress toward functional and medical goals  Care Tool:  Bathing    Body parts bathed by patient: Face, Front perineal area   Body parts bathed by helper: Right arm, Left arm, Abdomen, Buttocks, Left upper leg, Right lower  leg, Left lower leg, Chest, Right upper leg     Bathing assist Assist Level: Total Assistance - Patient < 25%     Upper Body Dressing/Undressing Upper body dressing    What is the patient wearing?: Hospital gown only    Upper body assist Assist Level: Dependent - Patient 0%    Lower Body Dressing/Undressing Lower body dressing      What is the patient wearing?: Underwear/pull up, Pants     Lower body assist Assist for lower body dressing: Maximal Assistance - Patient 25 - 49%     Toileting Toileting Toileting Activity did not occur (Clothing management and hygiene only): N/A (no void or bm)  Toileting assist Assist for toileting: 2 Helpers     Transfers Chair/bed transfer  Transfers assist     Chair/bed transfer assist level: Maximal Assistance - Patient 25 - 49%     Locomotion Ambulation   Ambulation assist   Ambulation activity did not occur: Safety/medical concerns          Walk 10 feet activity   Assist  Walk 10 feet activity did not occur: Safety/medical concerns        Walk 50 feet activity   Assist Walk 50 feet with 2 turns activity did not occur: Safety/medical concerns         Walk 150 feet activity   Assist Walk 150 feet activity did not occur: Safety/medical concerns         Walk 10 feet on uneven surface  activity   Assist Walk 10 feet on uneven surfaces activity did not occur: Safety/medical concerns         Wheelchair     Assist Will patient use wheelchair at discharge?: Yes Type of Wheelchair: Manual Wheelchair activity did not occur: Safety/medical concerns         Wheelchair 50 feet with 2 turns activity    Assist    Wheelchair 50 feet with 2 turns activity did not occur: Safety/medical concerns       Wheelchair 150 feet activity     Assist  Wheelchair 150 feet activity did not occur: Safety/medical concerns       Blood pressure 119/85, pulse 73, temperature 99 F (37.2 C), temperature source Oral, resp. rate 19, height 5\' 11"  (1.803 m), weight 88.8 kg, SpO2 100 %. Medical Problem List and Plan: 1.Left-sided weakness/dysphagiasecondary to right  MCA/ACA infarction withR ICA,R MCAocclusion with cerebral edema status post hemicraniectomy with abdominal flap implant [id number] complicated bycerebral abscess/empyema hemorrhagic conversion  Continue CIR PT, OT, SLP  PRAFO, WHO Left  -wear helmet when up with therapies 2. Antithrombotics: -DVT/anticoagulation:Right peroneal, left popliteal, left posterior tibial and left peroneal DVT. Status post IVC filter 07/21/2019 with plan for retrieval 8 to 12 weeks. Patient currently remains on subcutaneous heparin. No plan for long-term anticoagulation -antiplatelet therapy: N/A 3. Pain Management:Tramadol, tylenol as needed 4. Mood:Provide motional support -antipsychotic agents: N/A 5. Neuropsych: This patientis notcapable of making decisions on hisown behalf.  -ritalin initiated for arousal/initiation 6. Skin/Wound Care:Routine skin checks  -dress scalp prn 7. Fluids/Electrolytes/Nutrition:Routine in and outs.  Hypokalemia-potassium 3.4 on 10/16 ---> 3.3 today. Con Supplement  -labs ordered today  -mild transaminitis-LFT's continue to normalize  -prealbumin 26.0 10/19 8. Post stroke dysphagia. upgraded to Dysphagia #2withthin liquids.   -needs cueing/assistance with meals 9. Seizure prophylaxis. Keppra 500 mg twice daily 10. Cerebral abscess/empyema/cerebritis. Cultures no growth to date. Continue IV vancomycin and cefepime 08/20/2019 x 6 to 8 weeks per infectious disease.  Vanc dosing per pharmacy (level elevated today 49)  -scalp wound   culture negative. Likely fibronecrotic debris   -wbc's 5.2   -afebrile    -if wound continues to drain, consider NS re-assessment 11. Pseudomonas UTI. Zosyn completed 12. Hypertension with tachycardia. Lopressor 50 mg every 8 hours.    13. History of tobacco marijuana use. Urine drug screen positive marijuana. Provide counseling with family 62.  Sleep disturbance  Showing some  improvement     LOS: 10 days A FACE TO FACE EVALUATION WAS PERFORMED  Meredith Staggers 09/16/2019, 11:07 AM

## 2019-09-16 NOTE — Progress Notes (Signed)
Patients wife, Melene Muller, would like Dr. Naaman Plummer to give her a call regarding  some concerns she has.  [phone]

## 2019-09-16 NOTE — Progress Notes (Signed)
Physical Therapy Session Note  Patient Details  Name: Matthew Black MRN: [id number] Date of Birth: 11-03-1976  Today's Date: 09/16/2019 PT Individual Time: 0800-0910 PT Individual Time Calculation (min): 70 min   Short Term Goals: Week 2:  PT Short Term Goal 1 (Week 2): Pt will maintain midline orientation during functional mobility w/ min cues 50% of the time PT Short Term Goal 2 (Week 2): Pt will transfer bed<>chair w/ +1 assist PT Short Term Goal 3 (Week 2): Pt will perform sit<>stand w/ max assist +1 PT Short Term Goal 4 (Week 2): Pt will perform bed mobility w/ +1 assist  Skilled Therapeutic Interventions/Progress Updates:   Pt in supine and agreeable to therapy, no evidence or c/o pain throughout session. Donned pants in supine when therapist noted pt's brief and bed linens were soaked of urine, condom cath had fallen off at some point. R/L rolling w/ max-total assist for brief management and pericare. RN present to don new catheter. Pt able to bridge to bring pants over L hips w/ min assist, total assist for R side. Supine>sit w/ total assist. Stand pivot transfer to TIS w/ max assist and verbal/tactile cues for sequencing and RUE placement. Total assist to don new gown once in TIS. Total assist w/c transport to/from therapy gym. Worked on blocked practice of sit<>stands at rail w/ mod assist to boost into standing and min-mod assist to maintain static stance. Worked on achieving upright posture and midline orientation of head. Pt's trunk and extremities in surprisingly neutral midline w/o cues from therapist. Performed lateral weight shifting in stance w/ emphasis on L quad contraction in weight acceptance, nothing palpable felt, needed total assist to maintain extension. Performed 5+ bouts of standing. Worked on LLE passive hamstring and gastroc stretching during seated rest breaks, pt continues to present w/ increased tone in these muscle groups, able to achieve full range quickly however.  Adjusted pt's headrest to allow for more cervical extension at rest, added padding to draining scar site on scalp for pt's comfort as he is hesitant to being head to headrest because of sensitivity. Pt refuses to wear helmet when left at rest at end of session - made RN aware. Ended session resting and reclined in TIS, all needs met and in reach.   Therapy Documentation Precautions:  Precautions Precautions: Fall, Other (comment) Precaution Comments: crani helmet when OOB Required Braces or Orthoses: Other Brace Restrictions Weight Bearing Restrictions: No  Therapy/Group: Individual Therapy  Pakou Rainbow K Shalon Salado 09/16/2019, 9:12 AM

## 2019-09-16 NOTE — Progress Notes (Signed)
Pharmacy Antibiotic Note  Matthew Black is a 43 y.o. male transferred from acute to CIR on  09/06/2019.  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  Pharmacy has been consulted to continue dosing/monitoring cefepime and vancomycin for brain abscess/cerebritis.   Pt was started on vancomycin and cefepime on 08/20/19, with planned duration of 6-8 weeks per ID. On transfer to CIR, he was receiving cefepime 2 gm IV Q 8 hrs and vancomycin 1250 mg IV Q 8 hrs, with goal vancomycin trough level of 15-20 mg/L.   10/16 Vancomycin trough = 21 10/19 Vancomycin trough = 8 (no missed doses) - unsure reason for the dramatic drop in trough. Will conservatively increase. 10/23 Vancomycin trough = 49 (drawn correctly) 10/23 Vancomycin random level (drawn ~19.5 hrs after last 1500 mg IV dose and drawn ~14 hrs after level of 49 mg/L) = 12 mg/L; using these levels, vanc ke=0.1016; half-life=6.8 hrs  (Scr drawn at same time has decreased to 0.62, which is more in line with his baseline of ~0.60)  WBC 5.2, afebrile; Scr 0.6-0.7s, current Scr 0.62, CrCl > 100 ml/min   Plan: Continue cefepime 2 gm IV Q 8 hrs Vancomycin 1500 mg IV Q 12 hrs (goal vanc trough of 15-20 mg/L) Will target higher end of goal range given need for brain penetration. Monitor renal function, WBC, temp, vancomycin levels, cultures, clinical improvement  Height: 5\' 11"  (180.3 cm) Weight: 195 lb 12.3 oz (88.8 kg) IBW/kg (Calculated) : 75.3  Temp (24hrs), Avg:98.5 F (36.9 C), Min:97.9 F (36.6 C), Max:99 F (37.2  C)  Recent Labs  Lab 09/12/19 0417 09/12/19 1330 09/14/19 0444 09/16/19 0445 09/16/19 1836  WBC  --   --  5.2  --   --   CREATININE 0.64  --  0.72  --  0.62  VANCOTROUGH  --  8*  --  49*  --   VANCORANDOM  --   --   --   --  12    Estimated Creatinine Clearance: 129.4 mL/min (by C-G formula based on SCr of 0.62 mg/dL).    Allergies  Allergen Reactions  . Hydrocodone Nausea Only    Antimicrobials this admission: 9/21 Zosyn >> 9/26 9/26 Cefepime >> (11/20) 9/26 Vancomycin >> (11/20)  Microbiology results: 9/27 BCx X 2: NG/final 9/17  UCx: Pseudomonas aeruginosa (suscept to Zosyn) 9/14  Abdominal fluid: Gram stain: rate WBCs, NOS; cx: NG/final 9/27 Epidural abscess: Gram stain: mod WBCs (PMNs), NOS; cx: NG/final 9/27 MRSA PCR: positive   Thank you for involving pharmacy in this patient's care.  Gillermina Hu, PharmD, BCPS, [facility] Clinical Pharmacist 09/16/2019 7:43 PM

## 2019-09-16 NOTE — Progress Notes (Signed)
Patient vancomycin trough is 49. Notified Linna Hoff, [facility]. Instructed by pharmacy to hold the vancomycin today. Trough will be checked tomorrow to follow up.

## 2019-09-16 NOTE — Plan of Care (Signed)
  Problem: Consults Goal: RH STROKE PATIENT EDUCATION Description: See Patient Education module for education specifics  Outcome: Progressing Goal: Nutrition Consult-if indicated Outcome: Progressing   Problem: RH BOWEL ELIMINATION Goal: RH STG MANAGE BOWEL WITH ASSISTANCE Description: STG Manage Bowel with mod Assistance. Outcome: Progressing Goal: RH STG MANAGE BOWEL W/MEDICATION W/ASSISTANCE Description: STG Manage Bowel with Medication with mod Assistance. Outcome: Progressing   Problem: RH SKIN INTEGRITY Goal: RH STG SKIN FREE OF INFECTION/BREAKDOWN Description: Skin to remain free from breakdown while on rehab with min assist. Outcome: Progressing Goal: RH STG MAINTAIN SKIN INTEGRITY WITH ASSISTANCE Description: STG Maintain Skin Integrity With min Assistance. Outcome: Progressing   Problem: RH SAFETY Goal: RH STG ADHERE TO SAFETY PRECAUTIONS W/ASSISTANCE/DEVICE Description: STG Adhere to Safety Precautions With mod Assistance and appropriate assistive Device. Outcome: Progressing   Problem: RH COGNITION-NURSING Goal: RH STG USES MEMORY AIDS/STRATEGIES W/ASSIST TO PROBLEM SOLVE Description: STG Uses Memory Aids and Strategies With mod Assistance to Problem Solve. Outcome: Progressing   Problem: RH PAIN MANAGEMENT Goal: RH STG PAIN MANAGED AT OR BELOW PT'S PAIN GOAL Description: <3 on a 0-10 pain scale Outcome: Progressing   Problem: RH KNOWLEDGE DEFICIT Goal: RH STG INCREASE KNOWLEDGE OF HYPERTENSION Description: Patient and caregiver will demonstrate knowledge of HTN medications, dietary restrictions, and follow up care with the MD post discharge with min assist from rehab staff. Outcome: Progressing Goal: RH STG INCREASE KNOWLEDGE OF DYSPHAGIA/FLUID INTAKE Description: Patient and caregiver will demonstrate knowledge of dysphagia diets and thickened liquids, and follow up care with the MD post discharge with min assist from rehab staff.  Outcome:  Progressing Goal: RH STG INCREASE KNOWLEGDE OF HYPERLIPIDEMIA Description: Patient and caregiver will demonstrate knowledge of HLD medications, dietary restrictions, and follow up care with the MD post discharge with min assist from rehab staff.  Outcome: Progressing Goal: RH STG INCREASE KNOWLEDGE OF STROKE PROPHYLAXIS Description: Patient and caregiver will demonstrate knowledge of Stroke medications, dietary restrictions, and follow up care with the MD post discharge with min assist from rehab staff.  Outcome: Progressing

## 2019-09-16 NOTE — Progress Notes (Signed)
Speech Language Pathology Daily Session Note  Patient Details  Name: HURBERT WATERFORD MRN: [id number] Date of Birth: 08-05-1976  Today's Date: 09/16/2019 SLP Individual Time: 1102-1200 SLP Individual Time Calculation (min): 58 min  Short Term Goals: Week 2: SLP Short Term Goal 1 (Week 2): Patient will consume trials of thin liquids without overt s/s of aspiration and Min verbal cues over 2 sessions to assess readiness for repeat MBS. SLP Short Term Goal 2 (Week 2): Patient will demonstrate efficient mastication with complete oral clearance with trials of Dys. 3 textures with Min verbal cues over 2 sessions prior to upgrade. SLP Short Term Goal 3 (Week 2): Patient will vocalize on command in 75% of opportunities with Max A multimodal cues. SLP Short Term Goal 4 (Week 2): Patient will demonstrate sustained attention to tasks for ~5 minutes with Max verbal cues for redirection. SLP Short Term Goal 5 (Week 2): Patient will scan to midline/left field of enviornment ot locate functional items with Mod A multimodal cues. SLP Short Term Goal 6 (Week 2): Patient will demonstrate basic problem solving with Max A multimodal cues during functional tasks.  Skilled Therapeutic Interventions:  Pt was seen for skilled ST targeting goals for dysphagia and communication.  Upon arrival, pt indicated that he needed to have a bowel movement.  Pt had a small smear of stool in his incontinence brief and his condom cath had become displaced prior to therapist's arrival which required a complete linens and clothing change.  Nursing suggested using a bedpan and pt was able to pass a small amount of stool while on bedpan.  Pt consumed thin liquids with no overt s/s of aspiration and min assist for use of swallowing precautions.  Pt verbally responded to therapist's questions and initiated his own questions for therapist at the short phrase level with mod-max assist verbal cues.   Pt was mostly aphonic; however, he was able to  briefly achieve very quiet, hoarse voicing on initial sounds x2 following skilled instruction regarding biofeedback regarding the difference between voiced and voiceless output (therapist placed his hand on her throat while making voiced and voiceless sounds then had pt do the same on himself).  Pt was left in bed with bed alarm set and call bell within reach.  Continue per current plan of care.    Pain Pain Assessment Pain Scale: 0-10 Pain Score: 0-No pain  Therapy/Group: Individual Therapy  Majestic Brister, Selinda Orion 09/16/2019, 12:50 PM

## 2019-09-17 ENCOUNTER — Inpatient Hospital Stay (HOSPITAL_COMMUNITY): Payer: Medicaid Other

## 2019-09-17 LAB — CREATININE, SERUM
Creatinine, Ser: 0.68 mg/dL (ref 0.61–1.24)
GFR calc Af Amer: >60 mL/min (ref >60)
GFR calc non Af Amer: >60 mL/min (ref >60)

## 2019-09-17 NOTE — Progress Notes (Signed)
[facility] PHYSICAL MEDICINE & REHABILITATION PROGRESS NOTE   Subjective/Complaints: Lying in bed. No new complaints. Denies pain this morning  ROS: Limited due to cognitive/behavioral   Objective:   No results found. No results for input(s): WBC, HGB, HCT, PLT in the last 72 hours. Recent Labs    09/16/19 1836 09/17/19 0445  NA 138  --   K 3.4*  --   CL 104  --   CO2 24  --   GLUCOSE 94  --   BUN 7  --   CREATININE 0.62 0.68  CALCIUM 9.2  --     Intake/Output Summary (Last 24 hours) at 09/17/2019 0943 Last data filed at 09/17/2019 0415 Gross per 24 hour  Intake 100 ml  Output 850 ml  Net -750 ml     Physical Exam: Vital Signs Blood pressure 120/85, pulse 75, temperature 99 F (37.2 C), temperature source Oral, resp. rate 18, height 5\' 11"  (1.803 m), weight 88.8 kg, SpO2 100 %. Constitutional: No distress . Vital signs reviewed. HEENT: EOMI, oral membranes moist Neck: supple Cardiovascular: RRR without murmur. No JVD    Respiratory: CTA Bilaterally without wheezes or rales. Normal effort    GI: BS +, non-tender, non-distended  Skin: area of hypergranulation tissue on posterior aspect of crani incision. No scalp drainage today. Psych: flat but does engage with cueing Musc: No edema in extremities.  No tenderness in extremities. Neuro: Alert, engaging more. Speaking but dysphonic 0/5 LUE and LLE. Left inattention persists RUE/RLE appear to be 5/5 proximal distal   Assessment/Plan: 1. 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  Physiatrist is providing close team supervision and 24 hour management of active medical problems listed below.  Physiatrist and rehab team continue to assess barriers to discharge/monitor patient progress toward functional and medical goals  Care Tool:  Bathing    Body parts bathed by patient: Face, Front perineal area    Body parts bathed by helper: Right arm, Left arm, Abdomen, Buttocks, Left upper leg, Right lower leg, Left lower leg, Chest, Right upper leg     Bathing assist Assist Level: Total Assistance - Patient < 25%     Upper Body Dressing/Undressing Upper body dressing   What is the patient wearing?: Hospital gown only    Upper body assist Assist Level: Dependent - Patient 0%    Lower Body Dressing/Undressing Lower body dressing      What is the patient wearing?: Underwear/pull up, Pants     Lower body assist Assist for lower body dressing: Maximal Assistance - Patient 25 - 49%     Toileting Toileting Toileting Activity did not occur (Clothing management and hygiene only): N/A (no void or bm)  Toileting assist Assist for toileting: 2 Helpers     Transfers Chair/bed transfer  Transfers assist     Chair/bed transfer assist level: Maximal Assistance - Patient 25 - 49%     Locomotion Ambulation   Ambulation assist   Ambulation activity did not occur: Safety/medical concerns          Walk 10 feet activity   Assist  Walk 10 feet activity did not occur: Safety/medical concerns        Walk 50 feet activity   Assist Walk 50 feet with 2 turns activity did not occur: Safety/medical concerns         Walk 150 feet activity   Assist Walk 150 feet  activity did not occur: Safety/medical concerns         Walk 10 feet on uneven surface  activity   Assist Walk 10 feet on uneven surfaces activity did not occur: Safety/medical concerns         Wheelchair     Assist Will patient use wheelchair at discharge?: Yes Type of Wheelchair: Manual Wheelchair activity did not occur: Safety/medical concerns         Wheelchair 50 feet with 2 turns activity    Assist    Wheelchair 50 feet with 2 turns activity did not occur: Safety/medical concerns       Wheelchair 150 feet activity     Assist  Wheelchair 150 feet activity did not occur:  Safety/medical concerns       Blood pressure 120/85, pulse 75, temperature 99 F (37.2 C), temperature source Oral, resp. rate 18, height 5\' 11"  (1.803 m), weight 88.8 kg, SpO2 100 %. 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 Antithrombotics: -DVT/anticoagulation:Right peroneal, left popliteal, left posterior tibial and left peroneal DVT. Status post IVC filter 07/21/2019 with plan for retrieval 8 to 12 weeks. Patient currently remains on subcutaneous heparin. No plan for long-term anticoagulation -antiplatelet therapy: N/A 3. Pain Management:Tramadol, tylenol as needed 4. Mood:Provide motional support -antipsychotic agents: N/A 5. Neuropsych: This patientis notcapable of making decisions on hisown behalf.  -ritalin initiated for arousal/initiation 6. Skin/Wound Care:Routine skin checks  -dress scalp prn 7. Fluids/Electrolytes/Nutrition:Routine in and outs.  Hypokalemia-3.4 10/23  -continue supplement  -mild transaminitis-LFT's continue to normalize  -prealbumin 26.0 10/19 8. Post stroke dysphagia. upgraded to Dysphagia #2withthin liquids.   -needs cueing/assistance with meals 9. Seizure prophylaxis. Keppra 500 mg twice daily 10. Cerebral abscess/empyema/cerebritis. Cultures no growth to date. Continue IV vancomycin and cefepime 08/20/2019 x 6 to 8 weeks per infectious disease.  Vanc dosing per pharmacy (level elevated today 49)  -scalp wound   culture negative. Likely fibronecrotic debris   -wbc's 5.2   -low grade temp   -if wound continues to drain, consider NS re-assessment 11.  Pseudomonas UTI. Zosyn completed 12. Hypertension with tachycardia. Lopressor 50 mg every 8 hours.    13. History of tobacco marijuana use. Urine drug screen positive marijuana. Provide counseling with family 67.  Sleep disturbance  Showing some improvement     LOS: 11 days A FACE TO FACE EVALUATION WAS PERFORMED  Meredith Staggers 09/17/2019, 9:43 AM

## 2019-09-17 NOTE — Progress Notes (Signed)
Occupational Therapy Session Note  Patient Details  Name: Matthew Black MRN: [id number] Date of Birth: [birth year]  Today's Date: 09/17/2019 OT Individual Time: 3888-7579 OT Individual Time Calculation (min): 60 min    Short Term Goals: Week 1:  OT Short Term Goal 1 (Week 1): Pt will tolerate sitting EOB for 5 minutes within BADL task. OT Short Term Goal 1 - Progress (Week 1): Met OT Short Term Goal 2 (Week 1): Pt will maintain sitting balance with mod A of 1 person OT Short Term Goal 2 - Progress (Week 1): Progressing toward goal OT Short Term Goal 3 (Week 1): Pt will complete UB dressing with max A of 1 OT Short Term Goal 3 - Progress (Week 1): Progressing toward goal OT Short Term Goal 4 (Week 1): Pt will locate 2/3 grooming items on the L side of the sink. OT Short Term Goal 4 - Progress (Week 1): Progressing toward goal  Skilled Therapeutic Interventions/Progress Updates:    1:1. Pt greeted in bed with no report of pain. Pt agreeable to bathing and dressing at bed level. Pt able to scan far past midline with max VC for locating soap and min tactile cues for initiation of reach across midline to obtain soap bottle and wash cloth. Pt washes UB with HOH A to wash RUE with LUE for NMR and pt able ot wash LUE with no VC for initiation/attention to L body. Pt washes BLE with A to assume long sitting figure 4 with HOB elevated. Pt dons shirt with MAX A and VC for hemi dressing. Pt requires MOD A to roll to L and MAX A to R for placement of bed pan d/t no +2 available for safe transfer/CM. Pt attemtpt tovoid bladder into urinal with no success. Pt rolls as stated above to cleanse, place new brief and don pants. Exited session with pt seated in bed exit alarm on and call light in reach  Therapy Documentation Precautions:  Precautions Precautions: Fall, Other (comment) Precaution Comments: crani helmet when OOB Required Braces or Orthoses: Other Brace Restrictions Weight Bearing Restrictions:  No General:   Vital Signs:   Pain: Pain Assessment Pain Scale: 0-10 Pain Score: 0-No pain ADL: ADL Eating: Dependent Grooming: Maximal assistance Upper Body Bathing: Maximal assistance Lower Body Bathing: Dependent Upper Body Dressing: Maximal assistance Lower Body Dressing: Dependent Toileting: Dependent Toilet Transfer: (safety/medical ) Tub/Shower Transfer: (safety/medical) Vision   Perception    Praxis   Exercises:   Other Treatments:     Therapy/Group: Individual Therapy  Tonny Branch 09/17/2019, 9:57 AM

## 2019-09-18 ENCOUNTER — Inpatient Hospital Stay (HOSPITAL_COMMUNITY): Payer: Medicaid Other | Admitting: Speech Pathology

## 2019-09-18 NOTE — Progress Notes (Signed)
While getting pt cleaned up he asked me to not touch him and wanted to know why I had kidnapped him from the hospital last night. Pt oriented to person only, disoriented to time, situation, and place. Upon taking shift pt's tv noted to be on. Per speech therapy tv should not be left on all night resulting in nightmares. Per night nurse pt did not fall asleep until 0530. Pt in no immediate distress, call bell within reach, bed in lowest position alarm on, and door open. Will continue to monitor. Larina Earthly

## 2019-09-18 NOTE — Progress Notes (Signed)
Speech Language Pathology Daily Session Note  Patient Details  Name: Matthew Black MRN: [id number] Date of Birth: 1975/12/16  Today's Date: 09/18/2019 SLP Individual Time: [id number] SLP Individual Time Calculation (min): 45 min  Short Term Goals: Week 2: SLP Short Term Goal 1 (Week 2): Patient will consume trials of thin liquids without overt s/s of aspiration and Min verbal cues over 2 sessions to assess readiness for repeat MBS. SLP Short Term Goal 2 (Week 2): Patient will demonstrate efficient mastication with complete oral clearance with trials of Dys. 3 textures with Min verbal cues over 2 sessions prior to upgrade. SLP Short Term Goal 3 (Week 2): Patient will vocalize on command in 75% of opportunities with Max A multimodal cues. SLP Short Term Goal 4 (Week 2): Patient will demonstrate sustained attention to tasks for ~5 minutes with Max verbal cues for redirection. SLP Short Term Goal 5 (Week 2): Patient will scan to midline/left field of enviornment ot locate functional items with Mod A multimodal cues. SLP Short Term Goal 6 (Week 2): Patient will demonstrate basic problem solving with Max A multimodal cues during functional tasks.  Skilled Therapeutic Interventions: Skilled treatment session focused on dysphagia and speech goals. SLP facilitated session by providing Mod A verbal cues for sustained attention to self-feeding breakfast meal of Dys. 2 textures with thin liquids. Patient consumed meal without overt s/s of aspiration but required Mod verbal cues to attend to left anterior spillage. Patient with increased language of confusion today with Max verbal cues needed for an increased vocal intensity to achieve ~50-75% intelligibility at the phrase and sentence level.  Patient left upright in bed with alarm on and all needs within reach with NT present. Continue with current plan of care.      Pain Pain Assessment Pain Scale: 0-10 Pain Score: 0-No pain  Therapy/Group: Individual  Therapy  Matthew Black 09/18/2019, 10:46 AM

## 2019-09-18 NOTE — Progress Notes (Signed)
[facility] PHYSICAL MEDICINE & REHABILITATION PROGRESS NOTE   Subjective/Complaints: No new problems overnight.  ROS: Limited due to cognitive/behavioral   Objective:   No results found. No results for input(s): WBC, HGB, HCT, PLT in the last 72 hours. Recent Labs    09/16/19 1836 09/17/19 0445  NA 138  --   K 3.4*  --   CL 104  --   CO2 24  --   GLUCOSE 94  --   BUN 7  --   CREATININE 0.62 0.68  CALCIUM 9.2  --     Intake/Output Summary (Last 24 hours) at 09/18/2019 0919 Last data filed at 09/18/2019 0745 Gross per 24 hour  Intake 1783.54 ml  Output 800 ml  Net 983.54 ml     Physical Exam: Vital Signs Blood pressure 124/87, pulse 80, temperature 99.8 F (37.7 C), temperature source Oral, resp. rate 17, height 5\' 11"  (1.803 m), weight 88.8 kg, SpO2 100 %. Constitutional: No distress . Vital signs reviewed. HEENT: EOMI, oral membranes moist Neck: supple, dysphonic Cardiovascular: RRR without murmur. No JVD    Respiratory: CTA Bilaterally without wheezes or rales. Normal effort    GI: BS +, non-tender, non-distended  Skin: area of hypergranulation tissue on posterior aspect of crani incision. No scalp drainage today Psych: engages when verbally cued,  Musc: No edema in extremities.  No tenderness in extremities. Neuro: Alert, engaging more. Speaking but dysphonic 0/5 LUE and LLE. Left inattention persists RUE/RLE appear to be 5/5 proximal distal   Assessment/Plan: 1. 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  Physiatrist is providing close team supervision and 24 hour management of active medical problems listed below.  Physiatrist and rehab team continue to assess barriers to discharge/monitor patient progress toward functional and medical goals  Care Tool:  Bathing    Body parts bathed by patient: Face, Front perineal area   Body parts  bathed by helper: Right arm, Left arm, Abdomen, Buttocks, Left upper leg, Right lower leg, Left lower leg, Chest, Right upper leg     Bathing assist Assist Level: Total Assistance - Patient < 25%     Upper Body Dressing/Undressing Upper body dressing   What is the patient wearing?: Hospital gown only    Upper body assist Assist Level: Dependent - Patient 0%    Lower Body Dressing/Undressing Lower body dressing      What is the patient wearing?: Underwear/pull up, Pants     Lower body assist Assist for lower body dressing: Maximal Assistance - Patient 25 - 49%     Toileting Toileting Toileting Activity did not occur (Clothing management and hygiene only): N/A (no void or bm)  Toileting assist Assist for toileting: 2 Helpers     Transfers Chair/bed transfer  Transfers assist     Chair/bed transfer assist level: Maximal Assistance - Patient 25 - 49%     Locomotion Ambulation   Ambulation assist   Ambulation activity did not occur: Safety/medical concerns          Walk 10 feet activity   Assist  Walk 10 feet activity did not occur: Safety/medical concerns        Walk 50 feet activity   Assist Walk 50 feet with 2 turns activity did not occur: Safety/medical concerns         Walk 150 feet activity   Assist Walk 150 feet activity did not occur: Safety/medical concerns  Walk 10 feet on uneven surface  activity   Assist Walk 10 feet on uneven surfaces activity did not occur: Safety/medical concerns         Wheelchair     Assist Will patient use wheelchair at discharge?: Yes Type of Wheelchair: Manual Wheelchair activity did not occur: Safety/medical concerns         Wheelchair 50 feet with 2 turns activity    Assist    Wheelchair 50 feet with 2 turns activity did not occur: Safety/medical concerns       Wheelchair 150 feet activity     Assist  Wheelchair 150 feet activity did not occur: Safety/medical  concerns       Blood pressure 124/87, pulse 80, temperature 99.8 F (37.7 C), temperature source Oral, resp. rate 17, height 5\' 11"  (1.803 m), weight 88.8 kg, SpO2 100 %. 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 Antithrombotics: -DVT/anticoagulation:Right peroneal, left popliteal, left posterior tibial and left peroneal DVT. Status post IVC filter 07/21/2019 with plan for retrieval 8 to 12 weeks. Patient currently remains on subcutaneous heparin. No plan for long-term anticoagulation -antiplatelet therapy: N/A 3. Pain Management:Tramadol, tylenol as needed 4. Mood:Provide motional support -antipsychotic agents: N/A 5. Neuropsych: This patientis notcapable of making decisions on hisown behalf.  -ritalin initiated for arousal/initiation 6. Skin/Wound Care:Routine skin checks  -dress scalp prn 7. Fluids/Electrolytes/Nutrition:Routine in and outs.  Hypokalemia-3.4 10/23  -continue supplement  -mild transaminitis-LFT's continue to normalize  -prealbumin 26.0 10/19 8. Post stroke dysphagia. upgraded to Dysphagia #2withthin liquids.   -needs cueing/assistance with meals 9. Seizure prophylaxis. Keppra 500 mg twice daily 10. Cerebral abscess/empyema/cerebritis. Cultures no growth to date. Continue IV vancomycin and cefepime 08/20/2019 x 6 to 8 weeks per infectious disease.  Vanc dosing per pharmacy (level 12 10/23)  -scalp wound   culture negative. Likely fibronecrotic debris   -wbc's 5.2   -still low grade temp but clinically without changes   -if wound continues to drain, consider NS  re-assessment 11. Pseudomonas UTI. Zosyn completed 12. Hypertension with tachycardia. Lopressor 50 mg every 8 hours.    13. History of tobacco marijuana use. Urine drug screen positive marijuana. Provide counseling with family 59.  Sleep disturbance  Slept 6-8 hours last night    LOS: 12 days A FACE TO FACE EVALUATION WAS PERFORMED  Meredith Staggers 09/18/2019, 9:19 AM

## 2019-09-19 ENCOUNTER — Inpatient Hospital Stay (HOSPITAL_COMMUNITY): Payer: Medicaid Other | Admitting: Speech Pathology

## 2019-09-19 ENCOUNTER — Inpatient Hospital Stay (HOSPITAL_COMMUNITY): Payer: Medicaid Other | Admitting: Physical Therapy

## 2019-09-19 ENCOUNTER — Encounter (HOSPITAL_COMMUNITY): Payer: Self-pay

## 2019-09-19 LAB — BASIC METABOLIC PANEL
Anion gap: 11 (ref 5–15)
BUN: 6 mg/dL (ref 6–20)
CO2: 22 mmol/L (ref 22–32)
Calcium: 9.2 mg/dL (ref 8.9–10.3)
Chloride: 101 mmol/L (ref 98–111)
Creatinine, Ser: 0.66 mg/dL (ref 0.61–1.24)
GFR calc Af Amer: >60 mL/min (ref >60)
GFR calc non Af Amer: >60 mL/min (ref >60)
Glucose, Bld: 119 mg/dL — ABNORMAL HIGH (ref 70–99)
Potassium: 3.6 mmol/L (ref 3.5–5.1)
Sodium: 134 mmol/L — ABNORMAL LOW (ref 135–145)

## 2019-09-19 LAB — VANCOMYCIN, TROUGH: Vancomycin Tr: 11 ug/mL — ABNORMAL LOW (ref 15–20)

## 2019-09-19 MED ORDER — ONDANSETRON HCL 4 MG PO TABS
4.0000 mg | ORAL_TABLET | Freq: Three times a day (TID) | ORAL | Status: DC | PRN
  Administered 2019-09-19: 4 mg via ORAL
  Filled 2019-09-19: qty 1

## 2019-09-19 MED ORDER — CHLORHEXIDINE GLUCONATE CLOTH 2 % EX PADS
6.0000 | MEDICATED_PAD | Freq: Two times a day (BID) | CUTANEOUS | Status: DC
  Administered 2019-09-19 – 2019-10-07 (×33): 6 via TOPICAL

## 2019-09-19 NOTE — Progress Notes (Signed)
Pharmacy Antibiotic Note  Matthew Black is a 43 y.o. male transferred from acute to CIR on  09/06/2019.  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  Pharmacy has been consulted to continue dosing/monitoring cefepime and vancomycin for brain abscess/cerebritis.   Pt was started on vancomycin and cefepime on 08/20/19, with planned duration of 6-8 weeks per ID. On transfer to CIR, he was receiving cefepime 2 gm IV Q 8 hrs and vancomycin 1250 mg IV Q 8 hrs, with goal vancomycin trough level of 15-20 mg/L.   10/16 Vancomycin trough = 21 10/19 Vancomycin trough = 8 (no missed doses) - unsure reason for the dramatic drop in trough. Will conservatively increase. 10/23 Vancomycin trough = 49 (drawn correctly) 10/23 Vancomycin random level (drawn ~19.5 hrs after last 1500 mg IV dose and drawn ~14 hrs after level of 49 mg/L) = 12 mg/L; using these levels, vanc ke=0.1016; half-life=6.8 hrs  (Scr drawn at same time has decreased to 0.62, which is more in line with his baseline of ~0.60) 10/26 AM: Vancomycin trough of 11 was drawn 4 hours late, estimated true trough to be ~15   Plan: Cont Vancomycin 1500 mg IV q12h (goal vanc trough of 15-20 mg/L) Continue Cefepime 2g IV q8h Re-check levels as needed  Height: 5\' 11"  (180.3 cm) Weight: 195 lb 12.3 oz (88.8 kg) IBW/kg (Calculated) : 75.3  Temp (24hrs), Avg:99.5 F (37.5 C), Min:99.1 F (37.3 C), Max:99.9 F (37.7 C)  Recent Labs  Lab 09/12/19 0417  09/14/19 0444 09/16/19 0445 09/16/19 1836 09/17/19 0445 09/18/19 2252  WBC   --   --  5.2  --   --   --   --   CREATININE 0.64  --  0.72  --  0.62 0.68  --   VANCOTROUGH  --    < >  --  49*  --   --  11*  VANCORANDOM  --   --   --   --  12  --   --    < > = values in this interval not displayed.    Estimated Creatinine Clearance: 129.4 mL/min (by C-G formula based on SCr of 0.68 mg/dL).    Allergies  Allergen Reactions  . Hydrocodone Nausea Only    Antimicrobials this admission: 9/21 Zosyn >> 9/26 9/26 Cefepime >> (11/20) 9/26 Vancomycin >> (11/20)  Microbiology results: 9/27 BCx X 2: NG/final 9/17  UCx: Pseudomonas aeruginosa (suscept to Zosyn) 9/14  Abdominal fluid: Gram stain: rate WBCs, NOS; cx: NG/final 9/27 Epidural abscess: Gram stain: mod WBCs (PMNs), NOS; cx: NG/final 9/27 MRSA PCR: positive   Narda Bonds, PharmD, BCPS Clinical Pharmacist Phone: [phone]

## 2019-09-19 NOTE — Progress Notes (Signed)
Speech Language Pathology Daily Session Note  Patient Details  Name: Matthew Black MRN: [id number] Date of Birth: Dec 30, 1975  Today's Date: 09/19/2019  Session 1: SLP Individual Time: 1000-1055 SLP Individual Time Calculation (min): 55 min   Session 2: SLP Individual Time: [id number] SLP Individual Time Calculation (min): 30 min  Short Term Goals: Week 2: SLP Short Term Goal 1 (Week 2): Patient will consume trials of thin liquids without overt s/s of aspiration and Min verbal cues over 2 sessions to assess readiness for repeat MBS. SLP Short Term Goal 2 (Week 2): Patient will demonstrate efficient mastication with complete oral clearance with trials of Dys. 3 textures with Min verbal cues over 2 sessions prior to upgrade. SLP Short Term Goal 3 (Week 2): Patient will vocalize on command in 75% of opportunities with Max A multimodal cues. SLP Short Term Goal 4 (Week 2): Patient will demonstrate sustained attention to tasks for ~5 minutes with Max verbal cues for redirection. SLP Short Term Goal 5 (Week 2): Patient will scan to midline/left field of enviornment ot locate functional items with Mod A multimodal cues. SLP Short Term Goal 6 (Week 2): Patient will demonstrate basic problem solving with Max A multimodal cues during functional tasks.  Skilled Therapeutic Interventions:  Session 1: Skilled treatment session focused on speech and dysphagia goals. SLP facilitated session by providing Max A multimodal cues for patient to maximize vocal intensity at the word level successfully in 50% of opportunities. However, patient unable to carryover increased vocal intensity at the phrase and sentence level during an informal conversation resulting in ~50-75% intelligibility.  Patient also required Max-Total A verbal and visual cues to decode at the word level, suspect patient's inattention also impacted function. Patient verbalizing difficulty taking medications due to taste, therefore, patient observed  taking medications whole in puree. Patient demonstrated complete oral clearance without overt s/s of aspiration, therefore, recommend patient upgrade to medication administration whole in puree. Patient left upright in wheelchair with alarm on and all needs within reach. Continue with current plan of care.   Session 2: Skilled treatment session focused on dysphagia and speech goals. SLP facilitated session by providing Min-Mod A verbal cues for patient to self-monitor and correct left anterior spillage during lunch meal of Dys. 2 textures with thin liquids. Patient consumed meal without overt s/s of aspiration but required Mod-Max A verbal cues for sustained attention to self-feeding. Patient with increased verbal expression/initiation of conversation with ~80% intelligibility and Max verbal cues for use of an increased vocal intensity. Patient asked if he was still wearing his catheter because he needed to void. Patient able to utilize the urinal and void a minimal amount. Patient left upright in wheelchair with alarm on and all needs within reach. Continue with current plan of care.   Pain No/Denies Pain   Therapy/Group: Individual Therapy  Rejina Odle 09/19/2019, 12:36 PM

## 2019-09-19 NOTE — Progress Notes (Signed)
Physical Therapy Session Note  Patient Details  Name: Matthew Black MRN: [id number] Date of Birth: [birth year]  Today's Date: 09/19/2019 PT Individual Time: [id number] PT Individual Time Calculation (min): 45 min   Short Term Goals: Week 2:  PT Short Term Goal 1 (Week 2): Pt will maintain midline orientation during functional mobility w/ min cues 50% of the time PT Short Term Goal 2 (Week 2): Pt will transfer bed<>chair w/ +1 assist PT Short Term Goal 3 (Week 2): Pt will perform sit<>stand w/ max assist +1 PT Short Term Goal 4 (Week 2): Pt will perform bed mobility w/ +1 assist  Skilled Therapeutic Interventions/Progress Updates:  Pt received in w/c & agreeable to tx. Transported pt around unit via w/c dependent assist for time management. At rail in hallway pt transfers sit<>stand with max assist when pt observed to be incontinent of urine so assisted back to room. At sink in room pt transferred sit>stand with max assist +1, only requiring 2nd person on one occasion. Pt is able to stand with max assist for standing balance with BUE on sink and therapist blocking L knee to prevent buckling & providing cuing for upright posture. Therapist/tech perform posterior peri hygiene dependently and while sitting in w/c pt performs anterior peri hygiene. Therapist & tech provide total assist for doffing soiled clothing & brief & donning new brief & paper scrub pants. Pt requires assistance to hold LUE up to sink & total assist to wash hands. From w/c level pt utilized dynavision with task focusing on scanning to L of midline, anterior weight shift & returning to midline sitting balance/posture with pt requiring up to max assist to return to midline in w/c and max audio/verbal cuing to locate lights on board. At end of session pt left in TIS w/c with chair & alarm belt donned, RUE mitten donned, lap tray donned & call bell in lap.  No behaviors demonstrating pain during session.  Therapy  Documentation Precautions:  Precautions Precautions: Fall, Other (comment) Precaution Comments: crani helmet when OOB Required Braces or Orthoses: Other Brace Restrictions Weight Bearing Restrictions: No    Therapy/Group: Individual Therapy  Matthew Black 09/19/2019, 3:36 PM

## 2019-09-19 NOTE — Progress Notes (Signed)
Occupational Therapy Session Note  Patient Details  Name: Matthew Black MRN: [id number] Date of Birth: 07/10/1976  Today's Date: 09/19/2019 OT Individual Time: [id number] OT Individual Time Calculation (min): 55 min    Short Term Goals: Week 2:  OT Short Term Goal 1 (Week 2): Pt will maintain sitting balance with max A of 1 person OT Short Term Goal 2 (Week 2): Pt will complete UB dressing with max A of 1 OT Short Term Goal 3 (Week 2): Pt will locate grooming items on L side of the sink with mod verbal cues  Skilled Therapeutic Interventions/Progress Updates:    Pt resting in bed upon arrival.  Pt commented that his back hurt.  Pt in bed in diagonal position and close to foot.  Pt reported some relief with repositioning in bed.  Pt incontinent of bowel.  Tot A for hygiene and clothing management.  Pt rolls in bed to facilitate hugiene-min A to L and mod A to R. Pt's LLE positioned with knee flexion to facilitate bridging to pull pants over hips.  Pt recalled that LLE should be placed in pants first.  Pt assisted with pulling pants over hips but required rolling R<>L to complete tasks.  Supine>sit max A with pt initiated moving RLE off EOB and pushing through RUE to upright position.  Static sitting balance with RUE support at close supervision. Slide board transfer to w/c with max A.  Pt washed face, chest, and abdomen seated in w/c.  Pt required max A for UB dressing with max verbal cues.  Sit<>stand X 2 with tot A+2 (min A with increased proprioceptive input through L knee and support to prevent from buckling. Pt remained seated in w/c with belt alarm activated, lap tray in place, RUE mitt donned, and all needs within reach.   Therapy Documentation Precautions:  Precautions Precautions: Fall, Other (comment) Precaution Comments: crani helmet when OOB Required Braces or Orthoses: Other Brace Restrictions Weight Bearing Restrictions: No  Pain:  Pt c/o back pain (unrated); repositioned and  transferred to w/c with pt report "feeling better"   Therapy/Group: Individual Therapy  Leroy Libman 09/19/2019, 9:12 AM

## 2019-09-19 NOTE — Progress Notes (Signed)
[facility] PHYSICAL MEDICINE & REHABILITATION PROGRESS NOTE   Subjective/Complaints: Pt reports some nausea, but just finished breakfast- also ate an orange- says nausea mild- doesn't feel like will vomit. Denies needing anti-nausea meds. Asking when will get helmet off.    ROS: Limited due to cognitive/behavioral   Objective:   No results found. No results for input(s): WBC, HGB, HCT, PLT in the last 72 hours. Recent Labs    09/16/19 1836 09/17/19 0445 09/19/19 0400  NA 138  --  134*  K 3.4*  --  3.6  CL 104  --  101  CO2 24  --  22  GLUCOSE 94  --  119*  BUN 7  --  6  CREATININE 0.62 0.68 0.66  CALCIUM 9.2  --  9.2    Intake/Output Summary (Last 24 hours) at 09/19/2019 1221 Last data filed at 09/19/2019 1125 Gross per 24 hour  Intake 1830.4 ml  Output 1225 ml  Net 605.4 ml     Physical Exam: Vital Signs Blood pressure (!) 161/95, pulse 60, temperature 99.8 F (37.7 C), temperature source Oral, resp. rate 18, height 5\' 11"  (1.803 m), weight 88.8 kg, SpO2 100 %. Constitutional: No distress . Vital signs and labs reviewed. Wearing helmet and R mitten; in w/c with lap tray; whispering, NAD HEENT: EOMI, oral membranes moist Neck: supple, dysphonic/whispering Cardiovascular: RRR without murmur. No JVD    Respiratory: CTA Bilaterally without wheezes or rales. Normal effort    GI: BS +, non-tender, non-distended  Skin: area of hypergranulation tissue on posterior aspect of crani incision. No scalp drainage today Psych: engages when verbally cued, was talkative today Musc: No edema in extremities.  No tenderness in extremities. Neuro: Alert, engaging more. Speaking but dysphonic 0/5 LUE and LLE. Left inattention persists RUE/RLE appear to be 5/5 proximal distal   Assessment/Plan: 1. 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  Physiatrist is providing  close team supervision and 24 hour management of active medical problems listed below.  Physiatrist and rehab team continue to assess barriers to discharge/monitor patient progress toward functional and medical goals  Care Tool:  Bathing    Body parts bathed by patient: Face, Front perineal area, Right upper leg, Left upper leg, Abdomen, Chest   Body parts bathed by helper: Right arm, Left arm, Buttocks, Right lower leg, Left lower leg     Bathing assist Assist Level: Maximal Assistance - Patient 24 - 49%     Upper Body Dressing/Undressing Upper body dressing   What is the patient wearing?: Pull over shirt    Upper body assist Assist Level: Maximal Assistance - Patient 25 - 49%    Lower Body Dressing/Undressing Lower body dressing      What is the patient wearing?: Incontinence brief, Pants     Lower body assist Assist for lower body dressing: Maximal Assistance - Patient 25 - 49%     Toileting Toileting Toileting Activity did not occur (Clothing management and hygiene only): N/A (no void or bm)  Toileting assist Assist for toileting: 2 Helpers     Transfers Chair/bed transfer  Transfers assist     Chair/bed transfer assist level: Maximal Assistance - Patient 25 - 49%     Locomotion Ambulation   Ambulation assist   Ambulation activity did not occur: Safety/medical concerns          Walk 10 feet activity   Assist  Walk 10 feet activity did not occur: Safety/medical concerns        Walk 50 feet activity   Assist Walk 50 feet with 2 turns activity did not occur: Safety/medical concerns         Walk 150 feet activity   Assist Walk 150 feet activity did not occur: Safety/medical concerns         Walk 10 feet on uneven surface  activity   Assist Walk 10 feet on uneven surfaces activity did not occur: Safety/medical concerns         Wheelchair     Assist Will patient use wheelchair at discharge?: Yes Type of Wheelchair:  Manual Wheelchair activity did not occur: Safety/medical concerns         Wheelchair 50 feet with 2 turns activity    Assist    Wheelchair 50 feet with 2 turns activity did not occur: Safety/medical concerns       Wheelchair 150 feet activity     Assist  Wheelchair 150 feet activity did not occur: Safety/medical concerns       Blood pressure (!) 161/95, pulse 60, temperature 99.8 F (37.7 C), temperature source Oral, resp. rate 18, height 5\' 11"  (1.803 m), weight 88.8 kg, SpO2 100 %. 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 Antithrombotics: -DVT/anticoagulation:Right peroneal, left popliteal, left posterior tibial and left peroneal DVT. Status post IVC filter 07/21/2019 with plan for retrieval 8 to 12 weeks. Patient currently remains on subcutaneous heparin. No plan for long-term anticoagulation -antiplatelet therapy: N/A 3. Pain Management:Tramadol, tylenol as needed 4. Mood:Provide motional support -antipsychotic agents: N/A 5. Neuropsych: This patientis notcapable of making decisions on hisown behalf.  -ritalin initiated for arousal/initiation 6. Skin/Wound Care:Routine skin checks  -dress scalp prn 7. Fluids/Electrolytes/Nutrition:Routine in and outs.  Hypokalemia-3.4 10/23  -continue supplement  -mild transaminitis-LFT's continue to normalize  10/26- K+ 3.6  -prealbumin 26.0 10/19 8. Post stroke dysphagia. upgraded to Dysphagia #2withthin liquids.   -needs cueing/assistance with meals 9. Seizure prophylaxis. Keppra 500 mg twice daily 10. Cerebral  abscess/empyema/cerebritis. Cultures no growth to date. Continue IV vancomycin and cefepime 08/20/2019 x 6 to 8 weeks per infectious disease.  Vanc dosing per pharmacy (level 12 10/23)  -scalp wound   culture negative. Likely fibronecrotic debris   -wbc's 5.2   -still low grade temp but clinically without changes   -if wound continues to drain, consider NS re-assessment  10/26- will check CBC- Vanc trough ~ 15 per pharmacy 11. Pseudomonas UTI. Zosyn completed 12. Hypertension with tachycardia. Lopressor 50 mg every 8 hours.    13. History of tobacco marijuana use. Urine drug screen positive marijuana. Provide counseling with family 79.  Sleep disturbance  Slept 6-8 hours last night    LOS: 13 days A FACE TO FACE EVALUATION WAS PERFORMED  Inga Noller 09/19/2019, 12:21 PM

## 2019-09-19 NOTE — Progress Notes (Signed)
Occupational Therapy Weekly Progress Note  Patient Details  Name: Matthew Black MRN: [id number] Date of Birth: March 05, 1976  Beginning of progress report period: September 07, 2019 End of progress report period: September 20, 2019   Today's Date: 09/20/2019 OT Individual Time: 1300-1350 OT Individual Time Calculation (min): 50 min    Patient has met 3 of 3 short term goals.  Pt is making steady progress towards OT goals. He still needs max/total A +2 for transfers, but is making progress within BADL tasks. Pt initiated bathing and dressing, although still needs max/total A. Pt's sitting balance has also improved statically and can sit<>stand with max of 1 person. Continue current POC at this time.   Patient continues to demonstrate the following deficits: muscle weakness, decreased cardiorespiratoy endurance, impaired timing and sequencing, abnormal tone, unbalanced muscle activation, ataxia, decreased coordination and decreased motor planning, decreased midline orientation, decreased attention to left, left side neglect and decreased motor planning, decreased initiation, decreased attention, decreased awareness, decreased problem solving, decreased safety awareness, decreased memory and delayed processing and decreased sitting balance, decreased standing balance, decreased postural control, hemiplegia and decreased balance strategies and therefore will continue to benefit from skilled OT intervention to enhance overall performance with BADL and Reduce care partner burden.  Patient progressing toward long term goals..  Continue plan of care.  OT Short Term Goals Week 2:  OT Short Term Goal 1 (Week 2): Pt will maintain sitting balance with max A of 1 person OT Short Term Goal 1 - Progress (Week 2): Met OT Short Term Goal 2 (Week 2): Pt will complete UB dressing with max A of 1 OT Short Term Goal 2 - Progress (Week 2): Met OT Short Term Goal 3 (Week 2): Pt will locate grooming items on L side of the  sink with mod verbal cues OT Short Term Goal 3 - Progress (Week 2): Met Week 3:  OT Short Term Goal 1 (Week 3): Pt will complete sit<>stand at the sink with max A of 1 person OT Short Term Goal 2 (Week 3): Pt will complete 1 step of UB dressing task OT Short Term Goal 3 (Week 3): Pt demonstrate self-ROM of L UE with mod instructional cues  Skilled Therapeutic Interventions/Progress Updates:    Pt greeted seated in TIS wc with spouse present. Pt's spouse was helping pt use the urinal. Pt was unable to urinate sitting. OT suggested trying to stand to urinate. Pt brought to the sink in wc and completed sit<>stand with mod A and L knee block. Pt's spouse placed urinal while OT provided mod/max A for standing balance. Pt had already been incontinent of urine in brief and was unable to urinate more in urinal. Pt returned to sitting, then was provided was cloth to complete peri-care sitting down. Worked on L attention and visually scanning to the L to locate hot water and wash cloth. Pt needed max multimodal cues to look to the L. Pt then stood again in similar fashion, but was able to remove R UE from the sink to wash buttocks-OT providing maX A dynamic standing balance with L knee block. Pt then wanted to wash his face and continued working on visually scanning to the L during facewashing task. Verbal cues to also wash L side of his face. Pt requested to don shoes. TED hose donned with total A for time management. Utilized shoe horn to help don shoes with pt still needing total A for this task. Educated on self-ROM with pt able  to demonstrate understanding. OT also educated pt's spouse of L UE passive ROM. Pt left tilted in TIS wc with alarm belt on and spouse present.   Therapy Documentation Precautions:  Precautions Precautions: Fall, Other (comment) Precaution Comments: crani helmet when OOB Required Braces or Orthoses: Other Brace Restrictions Weight Bearing Restrictions: No Pain: Pain  Assessment Pain Scale: 0-10 Pain Score: 0-No pain   Therapy/Group: Individual Therapy  Valma Cava 09/20/2019, 1:55 PM

## 2019-09-20 ENCOUNTER — Inpatient Hospital Stay (HOSPITAL_COMMUNITY): Payer: Medicaid Other | Admitting: Occupational Therapy

## 2019-09-20 ENCOUNTER — Inpatient Hospital Stay (HOSPITAL_COMMUNITY): Payer: Medicaid Other | Admitting: Physical Therapy

## 2019-09-20 ENCOUNTER — Inpatient Hospital Stay (HOSPITAL_COMMUNITY): Payer: Medicaid Other | Admitting: Speech Pathology

## 2019-09-20 ENCOUNTER — Inpatient Hospital Stay (HOSPITAL_COMMUNITY): Payer: Medicaid Other

## 2019-09-20 DIAGNOSIS — R509 Fever, unspecified: Secondary | ICD-10-CM

## 2019-09-20 DIAGNOSIS — T8149XA Infection following a procedure, other surgical site, initial encounter: Secondary | ICD-10-CM

## 2019-09-20 LAB — URINALYSIS, COMPLETE (UACMP) WITH MICROSCOPIC
Bilirubin Urine: NEGATIVE
Glucose, UA: NEGATIVE mg/dL
Ketones, ur: NEGATIVE mg/dL
Leukocytes,Ua: NEGATIVE
Nitrite: NEGATIVE
Protein, ur: 30 mg/dL — AB
Specific Gravity, Urine: 1.02 (ref 1.005–1.030)
pH: 5 (ref 5.0–8.0)

## 2019-09-20 LAB — CBC WITH DIFFERENTIAL/PLATELET
Abs Immature Granulocytes: 0.02 10*3/uL (ref 0.00–0.07)
Basophils Absolute: 0.1 10*3/uL (ref 0.0–0.1)
Basophils Relative: 1 %
Eosinophils Absolute: 0.1 10*3/uL (ref 0.0–0.5)
Eosinophils Relative: 2 %
HCT: 35.1 % — ABNORMAL LOW (ref 39.0–52.0)
Hemoglobin: 11.4 g/dL — ABNORMAL LOW (ref 13.0–17.0)
Immature Granulocytes: 0 %
Lymphocytes Relative: 43 %
Lymphs Abs: 3.4 10*3/uL (ref 0.7–4.0)
MCH: 27.5 pg (ref 26.0–34.0)
MCHC: 32.5 g/dL (ref 30.0–36.0)
MCV: 84.8 fL (ref 80.0–100.0)
Monocytes Absolute: 1 10*3/uL (ref 0.1–1.0)
Monocytes Relative: 13 %
Neutro Abs: 3.3 10*3/uL (ref 1.7–7.7)
Neutrophils Relative %: 41 %
Platelets: 237 10*3/uL (ref 150–400)
RBC: 4.14 MIL/uL — ABNORMAL LOW (ref 4.22–5.81)
RDW: 14.3 % (ref 11.5–15.5)
WBC: 7.9 10*3/uL (ref 4.0–10.5)
nRBC: 0 % (ref 0.0–0.2)

## 2019-09-20 NOTE — Progress Notes (Signed)
Patient request to lay flat at night instead of inclined. Would like for staff to cluster care so that interruptions can be minimized to aid in his sleeping. If at all possible maybe we can retime some medications in order to facilitate this better.

## 2019-09-20 NOTE — Progress Notes (Signed)
Speech Language Pathology Daily Session Note  Patient Details  Name: GRAISEN BERGEMAN MRN: [id number] Date of Birth: 1975/12/04  Today's Date: 09/20/2019 SLP Individual Time: 1415-1500 SLP Individual Time Calculation (min): 45 min  Short Term Goals: Week 2: SLP Short Term Goal 1 (Week 2): Patient will consume trials of thin liquids without overt s/s of aspiration and Min verbal cues over 2 sessions to assess readiness for repeat MBS. SLP Short Term Goal 2 (Week 2): Patient will demonstrate efficient mastication with complete oral clearance with trials of Dys. 3 textures with Min verbal cues over 2 sessions prior to upgrade. SLP Short Term Goal 3 (Week 2): Patient will vocalize on command in 75% of opportunities with Max A multimodal cues. SLP Short Term Goal 4 (Week 2): Patient will demonstrate sustained attention to tasks for ~5 minutes with Max verbal cues for redirection. SLP Short Term Goal 5 (Week 2): Patient will scan to midline/left field of enviornment ot locate functional items with Mod A multimodal cues. SLP Short Term Goal 6 (Week 2): Patient will demonstrate basic problem solving with Max A multimodal cues during functional tasks.  Skilled Therapeutic Interventions: Skilled treatment session focused on dysphagia and speech goals. SLP facilitated session by providing of Dys. 3 textures. Patient demonstrated efficient mastication with mild oral residue that cleared with liquid washes. Recommend patient consume a trial tray prior to upgrade. SLP also facilitated session by providing Mod A verbal cues for use of an increased vocal intensity at the word level during a barrier task for 90% intelligibility. Patient did best with situational cues (talk loud like you're yelling at the TV during a football game) for best carryover. Patient left upright in bed with alarm on and all needs within reach. Continue with current plan of care.       Pain No/Denies Pain   Therapy/Group: Individual  Therapy  Kahle Mcqueen 09/20/2019, 3:11 PM

## 2019-09-20 NOTE — Progress Notes (Signed)
Physical Therapy Session Note  Patient Details  Name: NIJEE FRISBEY MRN: [id number] Date of Birth: March 01, 1976  Today's Date: 09/20/2019 PT Individual Time: [id number] PT Individual Time Calculation (min): 40 min   Short Term Goals: Week 2:  PT Short Term Goal 1 (Week 2): Pt will maintain midline orientation during functional mobility w/ min cues 50% of the time PT Short Term Goal 2 (Week 2): Pt will transfer bed<>chair w/ +1 assist PT Short Term Goal 3 (Week 2): Pt will perform sit<>stand w/ max assist +1 PT Short Term Goal 4 (Week 2): Pt will perform bed mobility w/ +1 assist  Skilled Therapeutic Interventions/Progress Updates:   Pt in supine and agreeable to therapy, reports increased back pain today 2/2 being uncomfortable last night in bed, does not rate pain. Donned shirt in supine w/ total assist and transferred supine>sit w/ max assist. Worked on static sitting balance while eating breakfast at EOB for 20+ minutes. Needed min-mod assist for static sitting balance w/ verbal/tactile/manual cues for postural control and midline orientation. Pt continues to have L lateral lean bias in sitting. Pt demonstrated no righting reactions w/ dynamic balance, needed mod-max to correct. Pt requesting to end session in TIS. Performed max assist stand pivot to TIS on R side w/ 2nd helper providing verbal and tactile cues for technique. Applied k-pad for pain relief on back. Ended session in TIS, all needs in reach. Helmet and RUE mitt donned.   Therapy Documentation Precautions:  Precautions Precautions: Fall, Other (comment) Precaution Comments: crani helmet when OOB Required Braces or Orthoses: Other Brace Restrictions Weight Bearing Restrictions: No  Therapy/Group: Individual Therapy  Lennyx Verdell K Abdulahi Schor 09/20/2019, 8:45 AM

## 2019-09-20 NOTE — Progress Notes (Signed)
Occupational Therapy Session Note  Patient Details  Name: Matthew Black MRN: [id number] Date of Birth: 11/04/1976  Today's Date: 09/20/2019 OT Individual Time: 2951-8841 OT Individual Time Calculation (min): 70 min    Short Term Goals: Week 2:  OT Short Term Goal 1 (Week 2): Pt will maintain sitting balance with max A of 1 person OT Short Term Goal 1 - Progress (Week 2): Met OT Short Term Goal 2 (Week 2): Pt will complete UB dressing with max A of 1 OT Short Term Goal 2 - Progress (Week 2): Met OT Short Term Goal 3 (Week 2): Pt will locate grooming items on L side of the sink with mod verbal cues OT Short Term Goal 3 - Progress (Week 2): Met  Skilled Therapeutic Interventions/Progress Updates:    OT intervention with focus on sit<>stand, standing balance, following one step commands, BADL retraining, attention to left, and LUE NMR to increase independence with BADLs.  Pt c/o back pain and L neck.  [employer] for back and Kinesiotape for shoulder. Initial focus on LB dressing with sit<>stand from w/c.  Pt initiated threading RLE into pants.  Sit<>stand with +2 min A and min A for standing balance at sink. Pt engaged in washing face when wash cloth presented.  Pt almost out of face wash and pt stated that he would have his wife bring in some more. Focus shifted to table tasks with emphasis on attending to L to locate colored pegs and place in peg board.  Pt required max verbal cues to turn head to L past midline.  Pt's eyes crossed midline minimally and pt with increased difficulty locating pegs in L inferior visual field.  Pt reached across midline with RUE to place peg on board. Pt able to replicate alternating pattern with min verbal cues. Pt remained seated in w/c with belt alarm activated, lap board in place, and all needs within reach.  Therapy Documentation Precautions:  Precautions Precautions: Fall, Other (comment) Precaution Comments: crani helmet when OOB Required Braces or Orthoses:  Other Brace Restrictions Weight Bearing Restrictions: No   Pain:  Pt c/o low back pain; Kpad and repositioning   Therapy/Group: Individual Therapy  Leroy Libman 09/20/2019, 11:41 AM

## 2019-09-20 NOTE — Progress Notes (Signed)
[place] PHYSICAL MEDICINE & REHABILITATION PROGRESS NOTE   Subjective/Complaints: Mr. Serratore was seen at bedside this morning with Dr. Naaman Plummer and therapy. Therapy reported that Mr. Vanduzer did not sleep well last night due to back pain, and Mr. Bargeron was able to confirm this. Yesterday he received Tramadol for the pain.   This morning he had a low grade temperature of 99.7. Last night his temperature was 100.7. Culture of his craniectomy site last week was negative and he continues on Cefepime and Vancomycin.    ROS: Limited due to cognitive/behavioral   Objective:   No results found. Recent Labs    09/20/19 0423  WBC 7.9  HGB 11.4*  HCT 35.1*  PLT 237   Recent Labs    09/19/19 0400  NA 134*  K 3.6  CL 101  CO2 22  GLUCOSE 119*  BUN 6  CREATININE 0.66  CALCIUM 9.2    Intake/Output Summary (Last 24 hours) at 09/20/2019 1101 Last data filed at 09/20/2019 0912 Gross per 24 hour  Intake 2020.4 ml  Output 300 ml  Net 1720.4 ml     Physical Exam: Vital Signs Blood pressure 139/87, pulse 79, temperature 99.7 F (37.6 C), temperature source Oral, resp. rate 16, height 5\' 11"  (1.803 m), weight 88.8 kg, SpO2 99 %. Constitutional: No distress . Vital signs and labs reviewed. Sitting at the edge of bed with food tray in front, working with therapists; whispering, NAD HEENT: EOMI, oral membranes moist Neck: supple, dysphonic/whispering Cardiovascular: RRR without murmur. No JVD    Respiratory: CTA Bilaterally without wheezes or rales. Normal effort    GI: BS +, non-tender, non-distended  Skin: area of hypergranulation tissue on posterior aspect of crani incision. No scalp drainage today Psych: continues to improve verbally, though requires multiple cues to respond. Musc: No edema in extremities.  No tenderness in extremities. Neuro: Alert, engaging more. Speaking but dysphonic 0/5 LUE and LLE. Left inattention persists RUE/RLE appear to be 5/5 proximal distal    Assessment/Plan: 1. 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  Physiatrist is providing close team supervision and 24 hour management of active medical problems listed below.  Physiatrist and rehab team continue to assess barriers to discharge/monitor patient progress toward functional and medical goals  Care Tool:  Bathing    Body parts bathed by patient: Face, Front perineal area, Right upper leg, Left upper leg, Abdomen, Chest   Body parts bathed by helper: Right arm, Left arm, Buttocks, Right lower leg, Left lower leg     Bathing assist Assist Level: Maximal Assistance - Patient 24 - 49%     Upper Body Dressing/Undressing Upper body dressing   What is the patient wearing?: Pull over shirt    Upper body assist Assist Level: Maximal Assistance - Patient 25 - 49%    Lower Body Dressing/Undressing Lower body dressing      What is the patient wearing?: Incontinence brief, Pants     Lower body assist Assist for lower body dressing: Maximal Assistance - Patient 25 - 49%     Toileting Toileting Toileting Activity did not occur (Clothing management and hygiene only): N/A (no void or bm)  Toileting assist Assist for toileting: 2 Helpers     Transfers Chair/bed transfer  Transfers assist     Chair/bed transfer assist level: Maximal Assistance - Patient 25 - 49%     Locomotion Ambulation   Ambulation  assist   Ambulation activity did not occur: Safety/medical concerns          Walk 10 feet activity   Assist  Walk 10 feet activity did not occur: Safety/medical concerns        Walk 50 feet activity   Assist Walk 50 feet with 2 turns activity did not occur: Safety/medical concerns         Walk 150 feet activity   Assist Walk 150 feet activity did not occur: Safety/medical concerns         Walk 10 feet on uneven surface   activity   Assist Walk 10 feet on uneven surfaces activity did not occur: Safety/medical concerns         Wheelchair     Assist Will patient use wheelchair at discharge?: Yes Type of Wheelchair: Manual Wheelchair activity did not occur: Safety/medical concerns         Wheelchair 50 feet with 2 turns activity    Assist    Wheelchair 50 feet with 2 turns activity did not occur: Safety/medical concerns       Wheelchair 150 feet activity     Assist  Wheelchair 150 feet activity did not occur: Safety/medical concerns       Blood pressure 139/87, pulse 79, temperature 99.7 F (37.6 C), temperature source Oral, resp. rate 16, height 5\' 11"  (1.803 m), weight 88.8 kg, SpO2 99 %. Medical Problem List and Plan: 1.Left-sided weakness/dysphagiasecondary to right MCA/ACA infarction withR ICA,R MCAocclusion with cerebral edema status post hemicraniectomy with abdominal flap implant [id number] complicated bycerebral abscess/empyema hemorrhagic conversion  Continue CIR PT, OT, SLP  PRAFO, WHO    -continue to wear helmet when up with therapies  -Ordered incentive spirometer to prevent atelectasis.  2. Antithrombotics: -DVT/anticoagulation:Right peroneal, left popliteal, left posterior tibial and left peroneal DVT. Status post IVC filter 07/21/2019 with plan for retrieval 8 to 12 weeks. Patient currently remains on subcutaneous heparin. No plan for long-term anticoagulation -antiplatelet therapy: N/A 3. Pain Management:Tramadol, tylenol as needed 4. Mood:Provide motional support -antipsychotic agents: N/A 5. Neuropsych: This patientis notcapable of making decisions on hisown behalf.  -ritalin initiated for arousal/initiation 6. Skin/Wound Care:Routine skin checks  -dress scalp prn 7. Fluids/Electrolytes/Nutrition:Routine in and outs.  Hypokalemia-3.4 10/23  -continue supplement  -mild transaminitis-LFT's continue to  normalize  10/26- K+ 3.6  -prealbumin 26.0 10/19 8. Post stroke dysphagia. upgraded to Dysphagia #2withthin liquids.   -needs cueing/assistance with meals 9. Seizure prophylaxis. Keppra 500 mg twice daily 10. Cerebral abscess/empyema/cerebritis. Cultures no growth to date. Continue IV vancomycin and cefepime 08/20/2019 x 6 to 8 weeks per infectious disease.  Vanc dosing per pharmacy (level 12 10/23)  -scalp wound   culture negative.    -wbc's 5.2   -still low grade temp but clinically without changes  10/26- will check CBC- Vanc trough ~ 15 per pharmacy  -given fever overnight and this morning despite antibiotics, will consult NSGY to assess craniectomy site. Suspect fibronecrotic debris given negative scalp culture last week. Ordered repeat aerobic culture of scalp wound today. Also ordered UA/UC given prior history of pseudomonas UTI.  11. Pseudomonas UTI. Zosyn completed.  12. Hypertension with tachycardia. Lopressor 50 mg every 8 hours.    13. History of tobacco marijuana use. Urine drug screen positive marijuana. Provide counseling with family 20.  Sleep disturbance  Continued to sleep poorly last night due to back pain. Received one dose of Tramadol. Recommended to Mr. Jurkovic that he try to sit up during the day  and minimize time flat on his back in his bed.    LOS: 14 days A FACE TO FACE EVALUATION WAS PERFORMED  Krutika P Raulkar 09/20/2019, 11:01 AM

## 2019-09-20 NOTE — Patient Care Conference (Signed)
Inpatient RehabilitationTeam Conference and Plan of Care Update Date: 09/20/2019   Time: 10:40 AM   Patient Name: Matthew Black      Medical Record Number: [id number]  Date of Birth: 01/02/1976 Sex: Male         Room/Bed: 4W14C/4W14C-01 Payor Info: Payor: MEDICAID [place] / Plan: MEDICAID OF [place] / Product Type: *No Product type* /    Admit Date/Time:  09/06/2019  3:37 PM  Primary Diagnosis:  Right middle cerebral artery stroke [facility])  Patient Active Problem List   Diagnosis Date Noted  . Wound infection after surgery   . Low grade fever   . Sleep disturbance   . Dysphagia, post-stroke   . Transaminitis   . 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 Cerebral abscess   . Urinary tract infection without hematuria   . Altered mental status   . Primary hypercoagulable state ([place])   . Acute pulmonary embolism without acute cor pulmonale (HCC)   . Deep vein thrombosis (DVT) of non-extremity vein   . Hypokalemia   . FUO (fever of unknown origin)   . Acute blood loss anemia   . SIRS (systemic inflammatory response syndrome) (HCC)   . Leukocytosis   . Endotracheal tube present   . Acute respiratory failure with hypoxemia ([place])   . Stroke (cerebrum) ([place]) 07/19/2019  . Pressure injury of skin 07/19/2019  . Acute CVA (cerebrovascular accident) ([place])   . Encephalopathy   . Dysphagia   . Acute encephalopathy   . Essential hypertension   . Annual physical exam 09/11/2016  . Obesity 03/12/2016  . PCP NOTES >>>>>>>>>>>>>>>>>. 03/12/2016  . Scalp abscess 12/20/2015  . Neck abscess 12/20/2015  . Pilonidal cyst 02/08/2013    Expected Discharge Date: Expected Discharge Date: 10/07/19  Team Members Present: Physician leading conference: Dr. Alger Simons Social Worker Present: Lennart Pall, LCSW Nurse Present: Other (comment)(Tina Wynetta Emery, RN) PT Present: Burnard Bunting, PT OT Present: Cherylynn Ridges, OT SLP Present: Weston Anna, SLP Other (Discipline and Name): Case Manager:   Karene Fry, RN PPS Coordinator present : Ileana Ladd, Burna Mortimer, SLP     Current Status/Progress Goal Weekly Team Focus  Bowel/Bladder   Continent and incontinent of bowel and bladder; LBM 10/26  Min assist  Assess and treat for constipation as needed; perhaps implement timed toileting as appropriate   Swallow/Nutrition/ Hydration   Dys. 2 textures with thin liquids, Min A  Supervision  use of swallowing strategies, trials of Dys. 3 textures   ADL's   Max/total A +2 overall-improved sit<>stand  Mod A overall  sitting balance, trunk control, transfers, self-care retraining, L attention, L NMR,   Mobility   max<>total assist overall with +2 for safety, stand pivot max assist, L pusher, dense L hemi  mod assist overall, w/c level  sitting/standing balance, postural control, midline orientation, transfers, bed mobility   Communication   Mod A  Supervision-Min A  increased vocal intensity/intelligibility, self-monitor and correcting mild verbal errors   Safety/Cognition/ Behavioral Observations  Mod-Max A  Min A  attention, recall with use of strategies, attention to left   Pain   C/o back pain and tenderness to scalp; scheduled tylenol and prn tramadol effective  < 3  Assess and treat for pain q shift and prn   Skin   Abscess to right side of head incision continues to drain thick yellow secretions.  Min/mod assist  Assess skin q shift and prn; perform dressing changes per MD order  Rehab Goals Patient on target to meet rehab goals: Yes *See Care Plan and progress notes for long and short-term goals.     Barriers to Discharge  Current Status/Progress Possible Resolutions Date Resolved   Nursing                  PT  Inaccessible home environment;Incontinence  pt needs ramp              OT                  SLP                SW                Discharge Planning/Teaching Needs:  Pt to return home with wife as primary caregiver with additional assistance from  children and extended family.  Teaching is ongoing but plan for more formal training closer to d/c.   Team Discussion: Slow progress, low grade temp, culture crani site last week negative, in touch with neurosurgery to see, may get another wound drainage sample.  Incontinent, taking whole meds, has back pain treated with k-pad and tramedol, restless, did not sleep well last pm.  OT max/tot, sat EOB S, had mod A goals.  PT max/total, occ +1 for stand pivot max assist, mod A goals.  SLP verbalizing, hard to understand, D2thins with trials of D3 soon.   Revisions to Treatment Plan:  N/A    Medical Summary Current Status: right MCA infarct with cerebritis/hydrocephalus, ongoing drainage from flap. low grade temp this morning. remains on abx Weekly Focus/Goal: wound care, ID considerations, pain control, nutrition  Barriers to Discharge: Medical stability   Possible Resolutions to Barriers: see medical progress notes   Continued Need for Acute Rehabilitation Level of Care: The patient requires daily medical management by a physician with specialized training in physical medicine and rehabilitation for the following reasons: Direction of a multidisciplinary physical rehabilitation program to maximize functional independence : Yes Medical management of patient stability for increased activity during participation in an intensive rehabilitation regime.: Yes Analysis of laboratory values and/or radiology reports with any subsequent need for medication adjustment and/or medical intervention. : Yes   I attest that I was present, lead the team conference, and concur with the assessment and plan of the team.   Jodell Cipro M 09/20/2019, 1:03 PM  Team conference was held via web/ teleconference due to [place] - 19

## 2019-09-20 NOTE — Progress Notes (Signed)
Pt requested morning meds be administered after he has breakfast today.

## 2019-09-20 NOTE — Progress Notes (Signed)
MD Naaman Plummer was sent a message requesting a PICC exchange for this patient. At this time the patient has a triple lumen PICC and this access is clotting off more frequently. The patient will continue ABT therapy until Oct 14, 2019 and will go home with ABT. At this time the site was assessed and sees to be flushing appropriately but no blood return from any lumen

## 2019-09-21 ENCOUNTER — Inpatient Hospital Stay (HOSPITAL_COMMUNITY): Payer: Medicaid Other

## 2019-09-21 ENCOUNTER — Inpatient Hospital Stay (HOSPITAL_COMMUNITY): Payer: Medicaid Other | Admitting: Physical Therapy

## 2019-09-21 ENCOUNTER — Inpatient Hospital Stay (HOSPITAL_COMMUNITY): Payer: Medicaid Other | Admitting: Occupational Therapy

## 2019-09-21 ENCOUNTER — Inpatient Hospital Stay (HOSPITAL_COMMUNITY): Payer: Medicaid Other | Admitting: Speech Pathology

## 2019-09-21 NOTE — Progress Notes (Signed)
  NEUROSURGERY PROGRESS NOTE   Came by to evaluate patient for a wound check due to low grade fever. On exam, craniectomy site is boggy, but certainly less tense. There is an area of questionable brain tissue through the crani site. No active drainage at present. Abd incision is well healed. Mild tenderness, but no warmth, redness, fluctuance, streaking.   Wound cultures negative to date.  Drainage likely necrotic tissue leaking from skin. No plan for wound exploration. Please call for any concerns.  Ferne Reus, PA-C [email or web address]

## 2019-09-21 NOTE — Progress Notes (Signed)
Hold PICC exchange due to blood culture pending, Dr. Naaman Plummer aware. Will continue to monitor.

## 2019-09-21 NOTE — Progress Notes (Signed)
Physical Therapy Weekly Progress Note  Patient Details  Name: Matthew Black MRN: [id number] Date of Birth: 02/02/1976  Beginning of progress report period: September 14, 2019 End of progress report period: September 21, 2019  Today's Date: 09/21/2019 PT Individual Time: 0800-0855 AND 1300-1400 PT Individual Time Calculation (min): 55 min AND 60 min  Patient has met 4 of 4 short term goals. He continues to make steady progress towards LTGs, performing most mobility w/ +1 assist. It is helpful to have +2 assist when possible for safety and to challenge pt. He requires max-total assist overall for bed mobility and stand pivot transfers. Postural control and midline orientation both appear better in standing vs sitting, expect pt to begin pre-gait training at some point over next week.   Patient continues to demonstrate the following deficits muscle weakness and muscle joint tightness, decreased cardiorespiratoy endurance, impaired timing and sequencing, abnormal tone, unbalanced muscle activation, motor apraxia, decreased coordination and decreased motor planning, decreased visual perceptual skills, decreased midline orientation and decreased attention to left, decreased initiation, decreased attention, decreased awareness, decreased problem solving, decreased safety awareness, decreased memory and delayed processing and decreased sitting balance, decreased standing balance, decreased postural control, hemiplegia and decreased balance strategies and therefore will continue to benefit from skilled PT intervention to increase functional independence with mobility.  Patient progressing toward long term goals..  Continue plan of care.  PT Short Term Goals Week 2:  PT Short Term Goal 1 (Week 2): Pt will maintain midline orientation during functional mobility w/ min cues 50% of the time PT Short Term Goal 1 - Progress (Week 2): Met PT Short Term Goal 2 (Week 2): Pt will transfer bed<>chair w/ +1 assist PT  Short Term Goal 2 - Progress (Week 2): Met PT Short Term Goal 3 (Week 2): Pt will perform sit<>stand w/ max assist +1 PT Short Term Goal 3 - Progress (Week 2): Met PT Short Term Goal 4 (Week 2): Pt will perform bed mobility w/ +1 assist PT Short Term Goal 4 - Progress (Week 2): Met Week 3:  PT Short Term Goal 1 (Week 3): Pt will initiate gait training PT Short Term Goal 2 (Week 3): Pt will transfer bed<>chair w/ max assist +1 consistently PT Short Term Goal 3 (Week 3): Pt will perform bed mobility w/ max assist +1 consistently PT Short Term Goal 4 (Week 3): Pt will initiate self-propulsion of w/c  Skilled Therapeutic Interventions/Progress Updates:   Session 1:  Pt in supine and agreeable to therapy w/ encouragement, reporting increased headache and back pain today but does not rate. RN present to provide pain medication. Donned pants in supine total assist including R/L rolling, although pt initiating movements to w/ LUE to pull pants over hips. Supine>sit w/ mod assist and max assist stand pivot +1 to TIS on R side. Total assist w/c transport to/from therapy gym. Worked on sit<>stands to rail w/ mod-max assist to boost and min assist to stabilize in static stance. Worked on pre-gait including lateral weight shifting w/ emphasis on L quad activation. Pt w/ L knee flexion tone in stance, preventing significant WB on L side, pt needed manual assist from therapist to keep foot contact w/ ground. Attempted R forward and backward stepping, however therapist unable to assist pt and keep LLE blocked at same time. Worked on sit<>stands to stedy w/ mod-max to boost and min assist to maintain static stance w/ verbal and tactile cues for midline, decreased midline orientation w/o wall to cue pt.  Performed 10+ stands in total this session w/ tactile and verbal cues for full upright posture and to bring head up. Returned to room and ended session in TIS, all needs in reach.   Session 2:  Pt in supine and  agreeable to therapy w/ max encouragement, no specific c/o pain. Pt frequently falling asleep throughout session, needed min-mod cues to stay awake at times. Eventually agreeable to get up to recliner to see if it would decrease back pain/soreness w/ upright sitting. Supine>sit w/ mod assist and verbal/tactile cues for technique. Stand pivot to recliner on pt's R side w/ max assist and 2nd helper providing tactile/verbal cues for RUE placement. Pt able to maintain midline and full upright posture in recliner more easily than in TIS. Transported to out of room in recliner and sat up at table. Worked on L visual scanning w/ simple peg board task and matching playing cards. Needed max multimodal cues to scan to L environment, however able to follow simple, 1-2 step commands w/o cues. Pt also able to rest head into neutral extension much more consistently in recliner vs TIS. Returned to room total assist and ended session in recliner, all needs in reach and in care of wife. Discharge planning discussion w/ wife regarding likely transfer technique, potential use of hospital bed at home, and manual w/c use instead of TIS. Pt's wife remains very involved and supportive of pt's recovery and care.   Therapy Documentation Precautions:  Precautions Precautions: Fall, Other (comment) Precaution Comments: crani helmet when OOB Required Braces or Orthoses: Other Brace Restrictions Weight Bearing Restrictions: No Vital Signs: Therapy Vitals Temp: 99.4 F (37.4 C) Temp Source: Oral Pulse Rate: 87 Resp: 16 BP: 135/90 Patient Position (if appropriate): Lying Oxygen Therapy SpO2: 100 % O2 Device: Room Air  Therapy/Group: Individual Therapy  Falisa Lamora K Leondra Cullin 09/21/2019, 8:59 AM

## 2019-09-21 NOTE — Progress Notes (Signed)
Patient presented with a temp of 100.9 F at the beginning of night shift. This nurse gave him his scheduled tylenol. Rechecked temp, was 101.7. This nurse called Sallye Lat, who was on call for the night. This nurse gave next scheduled dose of tylenol along with ice packs to help lower the patients temp. Rechecked temp 2hrs later per MEWS, patients temp is now 99.5 F. Will continue Q4hr vitals for 4 occurences. Call light at bedside, will continue to monitor patient through out the night.

## 2019-09-21 NOTE — Progress Notes (Signed)
Speech Language Pathology Weekly Progress and Session Note  Patient Details  Name: Matthew Black MRN: [id number] Date of Birth: Mar 01, 1976  Beginning of progress report period: September 14, 2019 End of progress report period: September 21, 2019  Today's Date: 09/21/2019 SLP Individual Time: 1100-1130 SLP Individual Time Calculation (min): 30 min  Short Term Goals: Week 2: SLP Short Term Goal 1 (Week 2): Patient will consume trials of thin liquids without overt s/s of aspiration and Min verbal cues over 2 sessions to assess readiness for repeat MBS. SLP Short Term Goal 1 - Progress (Week 2): Met SLP Short Term Goal 2 (Week 2): Patient will demonstrate efficient mastication with complete oral clearance with trials of Dys. 3 textures with Min verbal cues over 2 sessions prior to upgrade. SLP Short Term Goal 2 - Progress (Week 2): Not met SLP Short Term Goal 3 (Week 2): Patient will vocalize on command in 75% of opportunities with Max A multimodal cues. SLP Short Term Goal 3 - Progress (Week 2): Met SLP Short Term Goal 4 (Week 2): Patient will demonstrate sustained attention to tasks for ~5 minutes with Max verbal cues for redirection. SLP Short Term Goal 4 - Progress (Week 2): Met SLP Short Term Goal 5 (Week 2): Patient will scan to midline/left field of enviornment ot locate functional items with Mod A multimodal cues. SLP Short Term Goal 5 - Progress (Week 2): Not met SLP Short Term Goal 6 (Week 2): Patient will demonstrate basic problem solving with Max A multimodal cues during functional tasks. SLP Short Term Goal 6 - Progress (Week 2): Not met    New Short Term Goals: Week 3: SLP Short Term Goal 1 (Week 3): Pt will consume current diet with minimal overt s/s of aspiration/dysphagia and supervision level cues for use of compensatory swallow strategies. SLP Short Term Goal 2 (Week 3): Patient will demonstrate efficient mastication with complete oral clearance with trials of Dys. 3 textures  with Min verbal cues over 2 sessions prior to upgrade. SLP Short Term Goal 3 (Week 3): Given Max A cues, pt will increase vocal intensity at the phrase level to achieve 75% speech intelligibility. SLP Short Term Goal 4 (Week 3): Patient will demonstrate sustained attention to tasks for ~10 minutes with Max verbal cues for redirection. SLP Short Term Goal 5 (Week 3): Patient will scan to midline/left field of enviornment to locate functional items in 5 out of 10 opportunities with Mod A multimodal cues. SLP Short Term Goal 6 (Week 3): Patient will demonstrate basic problem solving with Max A multimodal cues during functional tasks.  Weekly Progress Updates: Pt has made progress over this reporting period and as a result he has met 3 out of 6 STGs. He participated in [facility] which revealed increased ability to consume thin liquids with good airway protection and as such he received upgrade to thins via cup. Additionally, he has consumed trials of dysphagia 3 with increased ability and mild oral residue. Additionally, he  is producing word to sentence length utterances. Pt's verbal expression is c/b quiet (almost aphonic) voicing that decreased his speech intelligibility to ~ 50% at the word level and 50% at the phrase/sentence level. Pt's continues to exprience cognitive deficits in sustained attention, basic problem solving and scanning to midline or to right of midline. Skilled ST continues to required to target the above mentioned deficits with recommendation of follow up therapy at discharge from CIR.      Intensity: Minumum of 1-2 x/day, 30  to 90 minutes Frequency: 3 to 5 out of 7 days Duration/Length of Stay: 10/07/19 Treatment/Interventions: Cognitive remediation/compensation;Dysphagia/aspiration precaution training;Internal/external aids;Speech/Language facilitation;Therapeutic Activities;Cueing hierarchy;Environmental controls;Functional tasks;Patient/family education   Daily Session  Skilled  Therapeutic Interventions: Skilled treatment session focused on communication and dysphagia goals. SLP recieved pt upright in wheelchair, very willing to participate in therapy session. SLP facilitated session by providing skilled observation of pt consuming dysphagia 3 trials (crackers). Pt with appropriate rate and bite size. Trace to minimal oral residue observed in left buccal cavity with cued liquid wash helpful in clearing. Pt was largely unaware of residue. Intermittent verbal cues for cup placement at lips helped to decreased anterior spillage with thin water. SLP positioned herself to pt's left but pt never scaned beyond midline to locate SLP. Communication therapy targeted increasing pt's vocal intensity/activating his vocal cords during speech. Throughout session, pt spoke with diminshed vocal intensity and decreased speech intelligibility of ~ 50% at the sentence level. Despite Max A cues to produce loud speech (with functional examples provided of loud speech), pt able to produce x 1 for 1 word. Pt also exhibited some oral apraxia with increased awareness when stating the ages of his children. Pt left upright in wheelchair, lap belt alarm on and all needs within reach with mitten place on pt's right hand. Will continue trials of dysphagia 3, target speech intelligibility and cognition.        General    Pain Pain Assessment Pain Scale: Faces Faces Pain Scale: Hurts even more Pain Type: Acute pain Pain Location: Back Pain Intervention(s): RN made aware;Repositioned  Therapy/Group: Individual Therapy  Mikah Rottinghaus 09/21/2019, 12:30 PM

## 2019-09-21 NOTE — Progress Notes (Signed)
Occupational Therapy Session Note  Patient Details  Name: Matthew Black MRN: [id number] Date of Birth: 1976/07/30  Today's Date: 09/21/2019 OT Individual Time: [id number] OT Individual Time Calculation (min): 39 min    Short Term Goals: Week 3:  OT Short Term Goal 1 (Week 3): Pt will complete sit<>stand at the sink with max A of 1 person OT Short Term Goal 2 (Week 3): Pt will complete 1 step of UB dressing task OT Short Term Goal 3 (Week 3): Pt demonstrate self-ROM of L UE with mod instructional cues  Skilled Therapeutic Interventions/Progress Updates:    Treatment session with focus on self-care retraining with hand over hand to incorporate use of LUE during bathing tasks.  Pt received tilted back in w/c for pressure relief - pt reports increased pain in back in this position.  Pt requesting to take a shower this session - discussed current medical concerns with elevated body temperature as well as PICC line.  Pt agreeable to washing up at sink.  Engaged in washing with focus on positioning of LUE to allow increased ability to wash Lt arm as well as hand over hand to incorporate LUE in to washing Rt arm.  Applied deodorant with hand over hand assist for NMR, allowing time for pt to initiate reaching and removing/replacing cap on deodorant.  Pt refused any standing this session due to pain in back.  Repositioned in w/c and left tilted back (not as reclined as upon arrival) with pt reporting decreased pain.  Notified RN of c/o pain and vitals assessed during session (WNL).  Therapy Documentation Precautions:  Precautions Precautions: Fall, Other (comment) Precaution Comments: crani helmet when OOB Required Braces or Orthoses: Other Brace Restrictions Weight Bearing Restrictions: No General:   Vital Signs: Therapy Vitals Temp: 98.5 F (36.9 C) Temp Source: Oral Pulse Rate: 82 BP: 115/78 Patient Position (if appropriate): Sitting Pain: Pain Assessment Pain Scale: Faces Faces Pain  Scale: Hurts even more Pain Type: Acute pain Pain Location: Back Pain Intervention(s): RN made aware;Repositioned   Therapy/Group: Individual Therapy  Simonne Come 09/21/2019, 12:18 PM

## 2019-09-22 ENCOUNTER — Inpatient Hospital Stay (HOSPITAL_COMMUNITY): Payer: Medicaid Other

## 2019-09-22 ENCOUNTER — Inpatient Hospital Stay (HOSPITAL_COMMUNITY): Payer: Medicaid Other | Admitting: Speech Pathology

## 2019-09-22 ENCOUNTER — Inpatient Hospital Stay (HOSPITAL_COMMUNITY): Payer: Medicaid Other | Admitting: Physical Therapy

## 2019-09-22 LAB — BASIC METABOLIC PANEL
Anion gap: 10 (ref 5–15)
BUN: 8 mg/dL (ref 6–20)
CO2: 23 mmol/L (ref 22–32)
Calcium: 8.9 mg/dL (ref 8.9–10.3)
Chloride: 104 mmol/L (ref 98–111)
Creatinine, Ser: 0.66 mg/dL (ref 0.61–1.24)
GFR calc Af Amer: >60 mL/min (ref >60)
GFR calc non Af Amer: >60 mL/min (ref >60)
Glucose, Bld: 101 mg/dL — ABNORMAL HIGH (ref 70–99)
Potassium: 3.2 mmol/L — ABNORMAL LOW (ref 3.5–5.1)
Sodium: 137 mmol/L (ref 135–145)

## 2019-09-22 MED ORDER — METHYLPHENIDATE HCL 5 MG PO TABS
10.0000 mg | ORAL_TABLET | Freq: Two times a day (BID) | ORAL | Status: DC
  Administered 2019-09-22 – 2019-10-07 (×26): 10 mg via ORAL
  Filled 2019-09-22 (×29): qty 2

## 2019-09-22 MED ORDER — METHOCARBAMOL 500 MG PO TABS
500.0000 mg | ORAL_TABLET | Freq: Four times a day (QID) | ORAL | Status: DC | PRN
  Administered 2019-09-22 – 2019-09-25 (×4): 500 mg via ORAL
  Filled 2019-09-22 (×5): qty 1

## 2019-09-22 MED ORDER — POTASSIUM CHLORIDE CRYS ER 20 MEQ PO TBCR
20.0000 meq | EXTENDED_RELEASE_TABLET | Freq: Two times a day (BID) | ORAL | Status: DC
  Administered 2019-09-22 – 2019-09-27 (×10): 20 meq via ORAL
  Filled 2019-09-22 (×11): qty 1

## 2019-09-22 NOTE — Progress Notes (Signed)
Social Work Patient ID: Matthew Black, male   DOB: 07-11-1976, 43 y.o.   MRN: [id number]  Met with pt and wife following team conference on Tuesday. Both aware and agreeable with targeted d/c date of 11/13 and understanding goals still for mod assist w/c level.  Reviewed probable DME needs and f/u with wife as well as need for education with caregivers prior to d/c.  Wife notes that pt's parents (as well as hers) are going to assist with providing the 24/7 care.    Marasia Newhall, LCSW

## 2019-09-22 NOTE — Progress Notes (Signed)
[place] PHYSICAL MEDICINE & REHABILITATION PROGRESS NOTE   Subjective/Complaints: No new issues this morning. Says that back hurts. Working with PT on trunk control  ROS: Limited due to cognitive/behavioral     Objective:   Dg Chest 2 View  Result Date: 09/21/2019 CLINICAL DATA:  Fever EXAM: CHEST - 2 VIEW COMPARISON:  August 17, 2019 FINDINGS: Stable position of right PICC line with tip overlying the right atrium. Enteric tube is no longer present. The lungs remain clear. There is no pleural effusion or pneumothorax. The cardiomediastinal silhouette is within normal limits. Osseous structures are unremarkable. IMPRESSION: No active cardiopulmonary disease. Electronically Signed   By: Macy Mis M.D.   On: 09/21/2019 15:43   Recent Labs    09/20/19 0423  WBC 7.9  HGB 11.4*  HCT 35.1*  PLT 237   Recent Labs    09/22/19 0348  NA 137  K 3.2*  CL 104  CO2 23  GLUCOSE 101*  BUN 8  CREATININE 0.66  CALCIUM 8.9    Intake/Output Summary (Last 24 hours) at 09/22/2019 0946 Last data filed at 09/22/2019 0758 Gross per 24 hour  Intake 460 ml  Output 1350 ml  Net -890 ml     Physical Exam: Vital Signs Blood pressure (!) 137/94, pulse 84, temperature 98.2 F (36.8 C), resp. rate 16, height 5\' 11"  (1.803 m), weight 89 kg, SpO2 100 %. Constitutional: No distress . Vital signs reviewed. HEENT: EOMI, oral membranes moist Neck: supple Cardiovascular: RRR without murmur. No JVD    Respiratory: CTA Bilaterally without wheezes or rales. Normal effort    GI: BS +, non-tender, non-distended  Skin: area of hypergranulation tissue on posterior aspect of crani incision. No scalp drainage seen today Psych: flat Musc: No edema in extremities.  No tenderness in extremities. Neuro: Alert, engaging more. Speaking but dysphonic. Leans to left when distracted. Can hold self steady when able to focu 0/5 LUE and LLE. Left inattention persists RUE/RLE appear to be 5/5 proximal  distal   Assessment/Plan: 1. 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  Physiatrist is providing close team supervision and 24 hour management of active medical problems listed below.  Physiatrist and rehab team continue to assess barriers to discharge/monitor patient progress toward functional and medical goals  Care Tool:  Bathing    Body parts bathed by patient: Face, Chest, Abdomen   Body parts bathed by helper: Right arm, Left arm     Bathing assist Assist Level: Moderate Assistance - Patient 50 - 74%(only completed UB bathing)     Upper Body Dressing/Undressing Upper body dressing   What is the patient wearing?: Hospital gown only    Upper body assist Assist Level: Maximal Assistance - Patient 25 - 49%    Lower Body Dressing/Undressing Lower body dressing      What is the patient wearing?: Incontinence brief, Pants     Lower body assist Assist for lower body dressing: Maximal Assistance - Patient 25 - 49%     Toileting Toileting Toileting Activity did not occur (Clothing management and hygiene only): N/A (no void or bm)  Toileting assist Assist for toileting: 2 Helpers     Transfers Chair/bed transfer  Transfers assist     Chair/bed transfer assist level: Maximal Assistance - Patient 25 - 49%     Locomotion Ambulation   Ambulation assist   Ambulation activity did not occur: Safety/medical concerns  Walk 10 feet activity   Assist  Walk 10 feet activity did not occur: Safety/medical concerns        Walk 50 feet activity   Assist Walk 50 feet with 2 turns activity did not occur: Safety/medical concerns         Walk 150 feet activity   Assist Walk 150 feet activity did not occur: Safety/medical concerns         Walk 10 feet on uneven surface  activity   Assist Walk 10 feet on uneven surfaces  activity did not occur: Safety/medical concerns         Wheelchair     Assist Will patient use wheelchair at discharge?: Yes Type of Wheelchair: Manual Wheelchair activity did not occur: Safety/medical concerns         Wheelchair 50 feet with 2 turns activity    Assist    Wheelchair 50 feet with 2 turns activity did not occur: Safety/medical concerns       Wheelchair 150 feet activity     Assist  Wheelchair 150 feet activity did not occur: Safety/medical concerns       Blood pressure (!) 137/94, pulse 84, temperature 98.2 F (36.8 C), resp. rate 16, height 5\' 11"  (1.803 m), weight 89 kg, SpO2 100 %. Medical Problem List and Plan: 1.Left-sided weakness/dysphagiasecondary to right MCA/ACA infarction withR ICA,R MCAocclusion with cerebral edema status post hemicraniectomy with abdominal flap implant [id number] complicated bycerebral abscess/empyema hemorrhagic conversion  Continue CIR PT, OT, SLP  PRAFO, WHO    -continue to wear helmet when up with therapies     2. Antithrombotics: -DVT/anticoagulation:Right peroneal, left popliteal, left posterior tibial and left peroneal DVT. Status post IVC filter 07/21/2019 with plan for retrieval 8 to 12 weeks. Patient currently remains on subcutaneous heparin. No plan for long-term anticoagulation -antiplatelet therapy: N/A 3. Pain Management:Tramadol, tylenol as needed 4. Mood:Provide motional support -antipsychotic agents: N/A 5. Neuropsych: This patientis notcapable of making decisions on hisown behalf.  -ritalin initiated for arousal/initiation 6. Skin/Wound Care:Routine skin checks  -dress scalp prn 7. Fluids/Electrolytes/Nutrition:Routine in and outs.  Hypokalemia-3.2 10/29---replete  -continue supplement   -prealbumin 26.0 10/19 8. Post stroke dysphagia. upgraded to Dysphagia #2withthin liquids.   -needs cueing/assistance with meals 9.  Seizure prophylaxis. Keppra 500 mg twice daily 10. Cerebral abscess/empyema/cerebritis. Cultures no growth to date. Continue IV vancomycin and cefepime 08/20/2019 x 6 to 8 weeks per infectious disease.  Vanc dosing per pharmacy (level 12 10/23)  -scalp wound   culture negative. Repeat looks like it will be negative also   -wbc's 7.9 10/27   -cxr unremarkable   -ua negative, ucx never sent   -NS has seen   -afebrile since early yesterday. Temp only up to 99 during afternoon   -suspect scalp wound is fibronecrotic tissue within scalp   -consider putting in new PICC tomorrow--this could be a line infection 11. Pseudomonas UTI. Zosyn completed.  12. Hypertension with tachycardia. Lopressor 50 mg every 8 hours.    13. History of tobacco marijuana use. Urine drug screen positive marijuana. Provide counseling with family 74.  Sleep disturbance  -treat pain--kpad/tramadol  LOS: 16 days A FACE TO FACE EVALUATION WAS PERFORMED  Meredith Staggers 09/22/2019, 9:46 AM

## 2019-09-22 NOTE — Progress Notes (Signed)
Occupational Therapy Session Note  Patient Details  Name: Matthew Black MRN: [id number] Date of Birth: Aug 16, 1976  Today's Date: 09/22/2019 OT Individual Time: 0930-1040 OT Individual Time Calculation (min): 70 min    Short Term Goals: Week 2:  OT Short Term Goal 1 (Week 2): Pt will maintain sitting balance with max A of 1 person OT Short Term Goal 1 - Progress (Week 2): Met OT Short Term Goal 2 (Week 2): Pt will complete UB dressing with max A of 1 OT Short Term Goal 2 - Progress (Week 2): Met OT Short Term Goal 3 (Week 2): Pt will locate grooming items on L side of the sink with mod verbal cues OT Short Term Goal 3 - Progress (Week 2): Met Week 3:  OT Short Term Goal 1 (Week 3): Pt will complete sit<>stand at the sink with max A of 1 person OT Short Term Goal 2 (Week 3): Pt will complete 1 step of UB dressing task OT Short Term Goal 3 (Week 3): Pt demonstrate self-ROM of L UE with mod instructional cues  Skilled Therapeutic Interventions/Progress Updates:    Pt resting in TIS w/c upon arrival.  Pt stated he needed to use bathroom. Stand pivot transfer to [facility] with mod A. Pt restless with persistent repositioning while seated on BSC.  RN present to admin meds. Pt unable to have BM or void and agreed to returning to w/c.  While seated on BSC and in w/c, pt's RLE noted with tremors that pt stated he wasn't doing intentionally,  This occurred X 4. RN witnessed.  Pt alert throughout.  Pt agreed to donning pants and washing face. Pt persistently repositioning in w/c during bathing tasks.  Sit<>stand from w/c with mod A to pull pants over hips. Pt was face when presented with with wash cloth. Pt returned to bed in attempt to help pt get comfortable.  Pt agreeable to returning to bed.  Pt requested to lay on his side.  Mod A for bed mobility, Pt remained in bed with bed alarm activated and call bell within reach.  RN notified.   Therapy Documentation Precautions:  Precautions Precautions: Fall,  Other (comment) Precaution Comments: crani helmet when OOB Required Braces or Orthoses: Other Brace Restrictions Weight Bearing Restrictions: No  Pain:  Pt c/o backache (unrated); RN notified, meds admin, and repositioned   Therapy/Group: Individual Therapy  Leroy Libman 09/22/2019, 12:08 PM

## 2019-09-22 NOTE — Progress Notes (Signed)
Physical Therapy Session Note  Patient Details  Name: Matthew Black MRN: [id number] Date of Birth: 09-25-1976  Today's Date: 09/22/2019 PT Individual Time: 4332-9518 PT Individual Time Calculation (min): 43 min   Short Term Goals: Week 1:  PT Short Term Goal 1 (Week 1): Pt will transfer bed<>chair w/ only +1 assist PT Short Term Goal 1 - Progress (Week 1): Progressing toward goal PT Short Term Goal 2 (Week 1): Pt will perform bed mobility w/ +1 assist PT Short Term Goal 2 - Progress (Week 1): Progressing toward goal PT Short Term Goal 3 (Week 1): Pt will tolerate 30 min of upright activity w/o increase in fatigue PT Short Term Goal 3 - Progress (Week 1): Met PT Short Term Goal 4 (Week 1): Pt will tolerate 60 min of sitting up in w/c in between therapy sessions PT Short Term Goal 4 - Progress (Week 1): Met Week 2:  PT Short Term Goal 1 (Week 2): Pt will maintain midline orientation during functional mobility w/ min cues 50% of the time PT Short Term Goal 1 - Progress (Week 2): Met PT Short Term Goal 2 (Week 2): Pt will transfer bed<>chair w/ +1 assist PT Short Term Goal 2 - Progress (Week 2): Met PT Short Term Goal 3 (Week 2): Pt will perform sit<>stand w/ max assist +1 PT Short Term Goal 3 - Progress (Week 2): Met PT Short Term Goal 4 (Week 2): Pt will perform bed mobility w/ +1 assist PT Short Term Goal 4 - Progress (Week 2): Met Week 3:  PT Short Term Goal 1 (Week 3): Pt will initiate gait training PT Short Term Goal 2 (Week 3): Pt will transfer bed<>chair w/ max assist +1 consistently PT Short Term Goal 3 (Week 3): Pt will perform bed mobility w/ max assist +1 consistently PT Short Term Goal 4 (Week 3): Pt will initiate self-propulsion of w/c  Skilled Therapeutic Interventions/Progress Updates:    Pain: Pt indicated back pain but no number given, treatment to tolerance.  Pt initially supine in bed w/nursing giving am meds.  Max assist for donning pants and total assit for  ted  hose in supine. Supine to side to sit w/mod assist and cues to attend to LUE/LLEt.  Scoots in sitting w/mod assist for balance. Total assist for shoes.once in sitting,  Worked on upright posture in sitting w/tactile cues to mid thoracic spine to promote extension.  . Squat pivot transfer to R side w/mod assist of 1. Pt transported to PT gym, pt manages IV pole w/RUE. STS in parallel bars w/mod assist for transition, max assist of 1 to maintain w/wbing to LLE, therapist stabilizing LLE/hip, cues for midline, pt uses R rail, promoted wbing and attention to LLE by having pt visually scan mirror to L and find himself in mirror.  Stood 1 min x 3 w/max assist. Pt returned to room, again pt managing IV pole. Pt left OOB in wc w/chair alarm set and needs in reach, laptray repositioned on wc for LUE support, NT in room w/pt.  Therapy Documentation Precautions:  Precautions Precautions: Fall, Other (comment) Precaution Comments: crani helmet when OOB Required Braces or Orthoses: Other Brace Restrictions Weight Bearing Restrictions: No   Therapy/Group: Individual Therapy  Callie Fielding, [place] 09/22/2019, 4:27 PM

## 2019-09-22 NOTE — Progress Notes (Signed)
Physical Therapy Session Note  Patient Details  Name: NAJEH BEEL MRN: [id number] Date of Birth: Oct 06, 1976  Today's Date: 09/22/2019 PT Individual Time: [id number] PT Individual Time Calculation (min): 25 min   Short Term Goals: Week 3:  PT Short Term Goal 1 (Week 3): Pt will initiate gait training PT Short Term Goal 2 (Week 3): Pt will transfer bed<>chair w/ max assist +1 consistently PT Short Term Goal 3 (Week 3): Pt will perform bed mobility w/ max assist +1 consistently PT Short Term Goal 4 (Week 3): Pt will initiate self-propulsion of w/c  Skilled Therapeutic Interventions/Progress Updates:   Pt in supine and agreeable to therapy w/ encouragement, pt c/o increased back pain overall today, reports having a restless night as he was unable to achieve comfortable position. Does not rate back pain. Brought pt into full supine and then into hooklying. Worked on gentle trunk rotation stretching, this was very painful for pt into R hooklying/L trunk rotation, he was also more limited in this direction. Verbal and visual cues for breathing strategies and relaxation w/ improvement in pain at end range trunk rotation stretching. Held in bouts of 30-60 sec, multiple reps on each side. Repositioned into L sidelying once finished, propped w/ pillows and pillow between legs. Applied moist heat to lower-mid back for pain relief. Pt remained in L sidelying at end of session, resting comfortably. All needs in reach. Missed 20 min of skilled PT 2/2 pain/fatigue.   Therapy Documentation Precautions:  Precautions Precautions: Fall, Other (comment) Precaution Comments: crani helmet when OOB Required Braces or Orthoses: Other Brace Restrictions Weight Bearing Restrictions: No General: PT Amount of Missed Time (min): 20 Minutes PT Missed Treatment Reason: Pain;Patient fatigue(back pain) Vital Signs: Therapy Vitals Temp: 97.9 F (36.6 C) Pulse Rate: 85 Resp: 18 BP: 132/89 Patient Position (if  appropriate): Lying Oxygen Therapy SpO2: 100 % O2 Device: Room Air   Therapy/Group: Individual Therapy  Magon Croson Clent Demark 09/22/2019, 2:39 PM

## 2019-09-22 NOTE — Progress Notes (Addendum)
Speech Language Pathology Daily Session Note  Patient Details  Name: Matthew Black MRN: [id number] Date of Birth: 09-03-1976  Today's Date: 09/22/2019 SLP Individual Time: 1431-1506 SLP Individual Time Calculation (min): 35 min  Short Term Goals: Week 3: SLP Short Term Goal 1 (Week 3): Pt will consume current diet with minimal overt s/s of aspiration/dysphagia and supervision level cues for use of compensatory swallow strategies. SLP Short Term Goal 2 (Week 3): Patient will demonstrate efficient mastication with complete oral clearance with trials of Dys. 3 textures with Min verbal cues over 2 sessions prior to upgrade. SLP Short Term Goal 3 (Week 3): Given Max A cues, pt will increase vocal intensity at the phrase level to achieve 75% speech intelligibility. SLP Short Term Goal 4 (Week 3): Patient will demonstrate sustained attention to tasks for ~10 minutes with Max verbal cues for redirection. SLP Short Term Goal 5 (Week 3): Patient will scan to midline/left field of enviornment to locate functional items in 5 out of 10 opportunities with Mod A multimodal cues. SLP Short Term Goal 6 (Week 3): Patient will demonstrate basic problem solving with Max A multimodal cues during functional tasks.  Skilled Therapeutic Interventions: Pt was seen for skilled ST targeting dysphagia and communication goals. SLP facilitated session with trials of upgraded Dys 3 solids with thin liquids. No overt s/s aspiration observed throughout pt's intake, however Min verbal cues required for clearance of oral residue. Mild anterior loss of both liquids and solids noted. Recommend continue current Dys 2/thin diet but trial full meal of Dys 3 solids at next available session. Pt required consistent cues to use increased vocal intensity for intelligible responses to clinician's questions. Pt able to express basic wants and needs and answer personally relevant questions at the phrase and short sentence level with Min A verbal  cues. Pt left laying in bed with alarm set, needs within reach, and family member still present. Continue per current plan of care.        Pain Pain Assessment Pain Score: 0-No pain  Therapy/Group: Individual Therapy  Arbutus Leas 09/22/2019, 3:09 PM

## 2019-09-23 ENCOUNTER — Inpatient Hospital Stay (HOSPITAL_COMMUNITY): Payer: Medicaid Other

## 2019-09-23 ENCOUNTER — Inpatient Hospital Stay: Payer: Self-pay

## 2019-09-23 ENCOUNTER — Ambulatory Visit (HOSPITAL_COMMUNITY): Payer: Medicaid Other | Admitting: Speech Pathology

## 2019-09-23 LAB — AEROBIC CULTURE W GRAM STAIN (SUPERFICIAL SPECIMEN)
Culture: NO GROWTH
Gram Stain: NONE SEEN

## 2019-09-23 LAB — VANCOMYCIN, TROUGH: Vancomycin Tr: 16 ug/mL (ref 15–20)

## 2019-09-23 MED ORDER — SODIUM CHLORIDE 0.9% FLUSH
10.0000 mL | INTRAVENOUS | Status: DC | PRN
  Administered 2019-09-26: 10 mL
  Filled 2019-09-23: qty 40

## 2019-09-23 MED ORDER — CITALOPRAM HYDROBROMIDE 10 MG PO TABS
10.0000 mg | ORAL_TABLET | Freq: Every day | ORAL | Status: DC
  Administered 2019-09-23 – 2019-09-26 (×4): 10 mg via ORAL
  Filled 2019-09-23 (×4): qty 1

## 2019-09-23 NOTE — Progress Notes (Signed)
Pharmacy Antibiotic Note  Matthew Black is a 43 y.o. male transferred from acute to CIR on  09/06/2019.  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  Pharmacy has been consulted to continue dosing/monitoring cefepime and vancomycin for brain abscess/cerebritis.   Pt was started on vancomycin and cefepime on 08/20/19, with planned duration of 6-8 weeks per ID. On transfer to CIR, he was receiving cefepime 2 gm IV Q 8 hrs and vancomycin 1250 mg IV Q 8 hrs, with goal vancomycin trough level of 15-20 mg/L.   10/16 Vancomycin trough = 21 10/19 Vancomycin trough = 8 (no missed doses) - unsure reason for the dramatic drop in trough. Will conservatively increase. 10/23 Vancomycin trough = 49 (drawn correctly) 10/23 Vancomycin random level (drawn ~19.5 hrs after last 1500 mg IV dose and drawn ~14 hrs after level of 49 mg/L) = 12 mg/L; using these levels, vanc ke=0.1016; half-life=6.8 hrs  (Scr drawn at same time has decreased to 0.62, which is more in line with his baseline of ~0.60) 10/26 AM: Vancomycin trough of 11 was drawn 4 hours late, estimated true trough to be ~15 10/29 VT = 16   Plan: Cont Vancomycin 1500 mg IV q12h (goal vanc trough of 15-20 mg/L) Continue Cefepime 2g IV q8h Re-check levels as needed  Height: 5\' 11"  (180.3 cm) Weight: 196 lb 3.4 oz (89 kg) IBW/kg (Calculated) : 75.3  Temp (24hrs), Avg:98.6 F (37 C), Min:98.2 F (36.8 C), Max:98.9 F (37.2 C)  Recent Labs  Lab 09/16/19 1836 09/17/19 0445 09/18/19 2252 09/19/19 0400 09/20/19 0423  09/22/19 0348 09/23/19 1348  WBC  --   --   --   --  7.9  --   --   CREATININE 0.62 0.68  --  0.66  --  0.66  --   VANCOTROUGH  --   --  11*  --   --   --  16  VANCORANDOM 12  --   --   --   --   --   --     Estimated Creatinine Clearance: 129.4 mL/min (by C-G formula based on SCr of 0.66 mg/dL).    Allergies  Allergen Reactions  . Hydrocodone Nausea Only    Antimicrobials this admission: 9/21 Zosyn >> 9/26 9/26 Cefepime >> (11/20) 9/26 Vancomycin >> (11/20)  Microbiology results: 9/27 BCx X 2: NG/final 9/17  UCx: Pseudomonas aeruginosa (suscept to Zosyn) 9/14  Abdominal fluid: Gram stain: rate WBCs, NOS; cx: NG/final 9/27 Epidural abscess: Gram stain: mod WBCs (PMNs), NOS; cx: NG/final 9/27 MRSA PCR: positive  Matthew Black, PharmD, BCPS Clinical Pharmacist Please see AMION for all pharmacy numbers 09/23/2019 2:55 PM

## 2019-09-23 NOTE — Progress Notes (Signed)
[facility] PHYSICAL MEDICINE & REHABILITATION PROGRESS NOTE   Subjective/Complaints: No new issues this morning. Says that back hurts. Working with PT on trunk control  ROS: Limited due to cognitive/behavioral     Objective:   Dg Chest 2 View  Result Date: 09/21/2019 CLINICAL DATA:  Fever EXAM: CHEST - 2 VIEW COMPARISON:  August 17, 2019 FINDINGS: Stable position of right PICC line with tip overlying the right atrium. Enteric tube is no longer present. The lungs remain clear. There is no pleural effusion or pneumothorax. The cardiomediastinal silhouette is within normal limits. Osseous structures are unremarkable. IMPRESSION: No active cardiopulmonary disease. Electronically Signed   By: Macy Mis M.D.   On: 09/21/2019 15:43   No results for input(s): WBC, HGB, HCT, PLT in the last 72 hours. Recent Labs    09/22/19 0348  NA 137  K 3.2*  CL 104  CO2 23  GLUCOSE 101*  BUN 8  CREATININE 0.66  CALCIUM 8.9    Intake/Output Summary (Last 24 hours) at 09/23/2019 1029 Last data filed at 09/23/2019 0755 Gross per 24 hour  Intake 690 ml  Output 1875 ml  Net -1185 ml     Physical Exam: Vital Signs Blood pressure 121/76, pulse 89, temperature 98.9 F (37.2 C), resp. rate 16, height 5\' 11"  (1.803 m), weight 89 kg, SpO2 97 %. Constitutional: No distress . Vital signs reviewed. HEENT: EOMI, oral membranes moist Neck: supple Cardiovascular: RRR without murmur. No JVD    Respiratory: CTA Bilaterally without wheezes or rales. Normal effort    GI: BS +, non-tender, non-distended  Skin: area of hypergranulation tissue on posterior aspect of crani incision. No scalp drainage seen today Psych: flat Musc: No edema in extremities.  No tenderness in extremities. Neuro: Alert, engaging more. Speaking but dysphonic. Leans to left when distracted. Can hold self steady when able to focu 0/5 LUE and LLE. Left inattention persists RUE/RLE appear to be 5/5 proximal distal    Assessment/Plan: 1. 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  Physiatrist is providing close team supervision and 24 hour management of active medical problems listed below.  Physiatrist and rehab team continue to assess barriers to discharge/monitor patient progress toward functional and medical goals  Care Tool:  Bathing    Body parts bathed by patient: Face, Chest, Abdomen   Body parts bathed by helper: Right arm, Left arm     Bathing assist Assist Level: Moderate Assistance - Patient 50 - 74%(only completed UB bathing)     Upper Body Dressing/Undressing Upper body dressing   What is the patient wearing?: Hospital gown only    Upper body assist Assist Level: Maximal Assistance - Patient 25 - 49%    Lower Body Dressing/Undressing Lower body dressing      What is the patient wearing?: Incontinence brief, Pants     Lower body assist Assist for lower body dressing: Maximal Assistance - Patient 25 - 49%     Toileting Toileting Toileting Activity did not occur (Clothing management and hygiene only): N/A (no void or bm)  Toileting assist Assist for toileting: 2 Helpers     Transfers Chair/bed transfer  Transfers assist     Chair/bed transfer assist level: Moderate Assistance - Patient 50 - 74%     Locomotion Ambulation   Ambulation assist   Ambulation activity did not occur: Safety/medical concerns  Walk 10 feet activity   Assist  Walk 10 feet activity did not occur: Safety/medical concerns        Walk 50 feet activity   Assist Walk 50 feet with 2 turns activity did not occur: Safety/medical concerns         Walk 150 feet activity   Assist Walk 150 feet activity did not occur: Safety/medical concerns         Walk 10 feet on uneven surface  activity   Assist Walk 10 feet on uneven surfaces activity did  not occur: Safety/medical concerns         Wheelchair     Assist Will patient use wheelchair at discharge?: Yes Type of Wheelchair: Manual Wheelchair activity did not occur: Safety/medical concerns         Wheelchair 50 feet with 2 turns activity    Assist    Wheelchair 50 feet with 2 turns activity did not occur: Safety/medical concerns       Wheelchair 150 feet activity     Assist  Wheelchair 150 feet activity did not occur: Safety/medical concerns       Blood pressure 121/76, pulse 89, temperature 98.9 F (37.2 C), resp. rate 16, height 5\' 11"  (1.803 m), weight 89 kg, SpO2 97 %. Medical Problem List and Plan: 1.Left-sided weakness/dysphagiasecondary to right MCA/ACA infarction withR ICA,R MCAocclusion with cerebral edema status post hemicraniectomy with abdominal flap implant 123XX123 complicated bycerebral abscess/empyema hemorrhagic conversion  Continue CIR PT, OT, SLP  PRAFO, WHO    -continue to wear helmet when up with therapies     2. Antithrombotics: -DVT/anticoagulation:Right peroneal, left popliteal, left posterior tibial and left peroneal DVT. Status post IVC filter 07/21/2019 with plan for retrieval 8 to 12 weeks. Patient currently remains on subcutaneous heparin. No plan for long-term anticoagulation -antiplatelet therapy: N/A 3. Pain Management:Tramadol, tylenol as needed   -kpad for back 4. Mood:Provide motional support -antipsychotic agents: N/A  -suspect growing depression- begin trial of celexa 10mg  qhs 5. Neuropsych: This patientis notcapable of making decisions on hisown behalf.  -ritalin initiated for arousal/initiation 6. Skin/Wound Care:Routine skin checks  -dress scalp prn 7. Fluids/Electrolytes/Nutrition:Routine in and outs.  Hypokalemia-3.2 10/29---replete  -continue supplement   -prealbumin 26.0 10/19 8. Post stroke dysphagia. upgraded to Dysphagia #2withthin liquids.    -needs cueing/assistance with meals 9. Seizure prophylaxis. Keppra 500 mg twice daily 10. Cerebral abscess/empyema/cerebritis. Cultures no growth to date. Continue IV vancomycin and cefepime 08/20/2019 x 6 to 8 weeks per infectious disease.  Vanc dosing per pharmacy (level 12 10/23)  -scalp wound   culture negative. Repeat looks  negative also   -wbc's 7.9 10/27   -cxr unremarkable   -ua negative, ucx never sent   -NS has seen   -afebrile since 10/28   -suspect scalp wound is fibronecrotic tissue within scalp   -replace PICC today, different arm, single lumen 11. Pseudomonas UTI. Zosyn completed.  12. Hypertension with tachycardia. Lopressor 50 mg every 8 hours.    13. History of tobacco marijuana use. Urine drug screen positive marijuana. Provide counseling with family    LOS: 17 days A FACE TO FACE EVALUATION WAS PERFORMED  Meredith Staggers 09/23/2019, 10:29 AM

## 2019-09-23 NOTE — Progress Notes (Signed)
Speech Language Pathology Daily Session Note  Patient Details  Name: Matthew Black MRN: [id number] Date of Birth: 1976/07/02  Today's Date: 09/23/2019 SLP Individual Time: 1200-1300 SLP Individual Time Calculation (min): 60 min  Short Term Goals: Week 3: SLP Short Term Goal 1 (Week 3): Pt will consume current diet with minimal overt s/s of aspiration/dysphagia and supervision level cues for use of compensatory swallow strategies. SLP Short Term Goal 2 (Week 3): Patient will demonstrate efficient mastication with complete oral clearance with trials of Dys. 3 textures with Min verbal cues over 2 sessions prior to upgrade. SLP Short Term Goal 3 (Week 3): Given Max A cues, pt will increase vocal intensity at the phrase level to achieve 75% speech intelligibility. SLP Short Term Goal 4 (Week 3): Patient will demonstrate sustained attention to tasks for ~10 minutes with Max verbal cues for redirection. SLP Short Term Goal 5 (Week 3): Patient will scan to midline/left field of enviornment to locate functional items in 5 out of 10 opportunities with Mod A multimodal cues. SLP Short Term Goal 6 (Week 3): Patient will demonstrate basic problem solving with Max A multimodal cues during functional tasks.  Skilled Therapeutic Interventions:  Skilled treatment session focused on cognition and dysphagia goals. SLP recieved pt upright I wheelchair, SLP provided skilled observation of pt consuming dysphagia 3 lunch tray with thin liquids. SLP assisted pt with setting up food items and assisted pt with initiation of self-feeding. Although pt was uncomfortable in his chair, he was able to sustain attention to task of self-feeding for ~ 45 minutes with supervision cues. Pt with good oral clearing of all textures (inclduing beef and sliced peaches). Pt intermittently had minimal left anterior spillage but independently wiped his mouth during these instances. At this time, pt is appropriate for diet upgrade to  dysphagia 3. Pt required Max  A cues to increase vocal intensity but was not effective in doing so. As a result, pt's speech intelligibility was ~ 50% at the phrase level. Pt's comments were appropriate to situation. Pt expressed that he was been sitting up in his wheelchair for a long time (very accurate). SLP and PT transferred pt back to bed with his wife finishing supervision.   Time was also spent in the session providing education to pt's wife on what s/s of dysphagia to look for and how dysphagia 3 diet was different than dysphagia 2. All questions were answered to her satisfaction. Education also ongoing.      Pain    Therapy/Group: Individual Therapy  [name] 09/23/2019, 1:03 PM

## 2019-09-23 NOTE — Progress Notes (Signed)
Occupational Therapy Session Note  Patient Details  Name: Matthew Black MRN: [id number] Date of Birth: [birth year]  Today's Date: 09/23/2019 OT Individual Time: 1045-1200 OT Individual Time Calculation (min): 75 min    Short Term Goals: Week 3:  OT Short Term Goal 1 (Week 3): Pt will complete sit<>stand at the sink with max A of 1 person OT Short Term Goal 2 (Week 3): Pt will complete 1 step of UB dressing task OT Short Term Goal 3 (Week 3): Pt demonstrate self-ROM of L UE with mod instructional cues  Skilled Therapeutic Interventions/Progress Updates:    Pt resting in w/c upon arrival.  OT intervention with focus on BADL retraining including bathing/dressing with sit<>stand from w/c at sink, standing balance, following one step commands, LUE NMR during ADLs, positioning for increased comfort when in w/c, and activity tolerance to increase independence with BADLs. Engaged pt's LUE in bathing tasks with [facility] assist. Pt used RUE to bathe UB with mod verbal cues for sequencing and initiation.  Sit<>stand X 4 for LB bathing and dressing tasks.  Min A for sit<>stand and standing balance with mod verbal cues for weight shifts. Kinesio tape on pt's L shoulder was rolling off and therapist attempted to gently remove tape.  Pt unable to tolerate and became unsettled and fidgety in w/c.  Task terminated and remainder of tape left in place.  Pt continues to sit with flexed head with lateral tilt to R and pt c/o of neck pain.  A different head rest swapped out to assist with better positioning.  Pt remained seated in w/c with all needs within reach and lap tray in place.   Therapy Documentation Precautions:  Precautions Precautions: Fall, Other (comment) Precaution Comments: crani helmet when OOB Required Braces or Orthoses: Other Brace Restrictions Weight Bearing Restrictions: No   Pain:  Pt indicated that his neck and back hurt but unable to explain more specifically; repositioned and  Kpad   Therapy/Group: Individual Therapy  Leroy Libman 09/23/2019, 12:02 PM

## 2019-09-23 NOTE — Progress Notes (Signed)
Peripherally Inserted Central Catheter/Midline Placement  The IV Nurse has discussed with the patient and/or persons authorized to consent for the patient, the purpose of this procedure and the potential benefits and risks involved with this procedure.  The benefits include less needle sticks, lab draws from the catheter, and the patient may be discharged home with the catheter. Risks include, but not limited to, infection, bleeding, blood clot (thrombus formation), and puncture of an artery; nerve damage and irregular heartbeat and possibility to perform a PICC exchange if needed/ordered by physician.  Alternatives to this procedure were also discussed.  Bard Power PICC patient education guide, fact sheet on infection prevention and patient information card has been provided to patient /or left at bedside.  Consent obtained from wife due to altered mental status.  PICC exchanged TL to SL.  PICC/Midline Placement Documentation  PICC Single Lumen 123XX123 PICC Right Basilic 46 cm 0 cm (Active)  Indication for Insertion or Continuance of Line Prolonged intravenous therapies 09/23/19 2045  Exposed Catheter (cm) 0 cm 09/23/19 2045  Site Assessment Clean;Dry;Intact 09/23/19 2045  Line Status Flushed;Saline locked;Blood return noted 09/23/19 2045  Dressing Type Transparent 09/23/19 2045  Dressing Status Clean;Dry;Intact 09/23/19 2045  Dressing Intervention New dressing 09/23/19 2045  Dressing Change Due 09/30/19 09/23/19 2045       Armend Hochstatter, Nicolette Bang 09/23/2019, 8:46 PM

## 2019-09-23 NOTE — Progress Notes (Signed)
Reported pt didn't sleep at all last night. Wants to go home. Refused am meds until breakfast. Pt is resting now.

## 2019-09-23 NOTE — Progress Notes (Signed)
Spoke with Santiago Glad, RN regarding ML order as per notes patient is getting vancomycin for >6 more days.  Vancomycin is contraindicated to run via ML for this period of time.  If ML is bridge until another PICC can be placed after current PICC is removed then VAST can place but if ML is planned for remainder of medication then it cannot be placed for this purpose.  Santiago Glad, RN to contact MD for clarification and will update VAST once more information is obtained.  Carolee Rota, RN

## 2019-09-23 NOTE — Progress Notes (Signed)
Discussed with Levada Dy (IV team) early today. Will try and insert single lumen PICC this evening but unable to locate another site when looking earlier. Due to several factors will have both orders ready (exchange and insertion) for insertion of single lumen PICC at bedside. Levada Dy will d/c the order that is not needed depending on the optimal site for continuous ABT treatment.

## 2019-09-23 NOTE — Progress Notes (Signed)
Physical Therapy Session Note  Patient Details  Name: Matthew Black MRN: [id number] Date of Birth: 01/30/1976  Today's Date: 09/23/2019 PT Individual Time: 0800-0900 PT Individual Time Calculation (min): 60 min   Short Term Goals: Week 1:  PT Short Term Goal 1 (Week 1): Pt will transfer bed<>chair w/ only +1 assist PT Short Term Goal 1 - Progress (Week 1): Progressing toward goal PT Short Term Goal 2 (Week 1): Pt will perform bed mobility w/ +1 assist PT Short Term Goal 2 - Progress (Week 1): Progressing toward goal PT Short Term Goal 3 (Week 1): Pt will tolerate 30 min of upright activity w/o increase in fatigue PT Short Term Goal 3 - Progress (Week 1): Met PT Short Term Goal 4 (Week 1): Pt will tolerate 60 min of sitting up in w/c in between therapy sessions PT Short Term Goal 4 - Progress (Week 1): Met Week 2:  PT Short Term Goal 1 (Week 2): Pt will maintain midline orientation during functional mobility w/ min cues 50% of the time PT Short Term Goal 1 - Progress (Week 2): Met PT Short Term Goal 2 (Week 2): Pt will transfer bed<>chair w/ +1 assist PT Short Term Goal 2 - Progress (Week 2): Met PT Short Term Goal 3 (Week 2): Pt will perform sit<>stand w/ max assist +1 PT Short Term Goal 3 - Progress (Week 2): Met PT Short Term Goal 4 (Week 2): Pt will perform bed mobility w/ +1 assist PT Short Term Goal 4 - Progress (Week 2): Met Week 3:  PT Short Term Goal 1 (Week 3): Pt will initiate gait training PT Short Term Goal 2 (Week 3): Pt will transfer bed<>chair w/ max assist +1 consistently PT Short Term Goal 3 (Week 3): Pt will perform bed mobility w/ max assist +1 consistently PT Short Term Goal 4 (Week 3): Pt will initiate self-propulsion of w/c  Skilled Therapeutic Interventions/Progress Updates:    PAIN L hip, pt resting w/hip in full ER position, pain relieved w/repositioning and mobility.  Pt initially supine.  Total assist to donn ted hose L, max R.  Max assist for donning  pants, pt able to thread RLE and assist by rolling w/max asst to R, min to L w/rail.  Supine to side to sit w/max assist and ver bal cues for sequencing.   Static sit w/cga to mod assist at edge of bed. Worked on sitting  Balance w/fuctional tasks of: Moving LUE w/RUE While therapist donned shoes/total assist for donning, cga to mod for balance during task. Due to post/L tendency Visual scanning activity to L Donning helmet/w/mod assist.  STS from bed w/max assist of 1 for static balance, stabilization of LLE/knee/hip protraction,  while therapist raised pants for pt. Worked on erect posture/static stand at edge of bed x 3 efforts. SPT to R side w/mod assist of 1.  Pt transported to gym, pt assists by rolling IV pole. STS in parallel bars w/mod assist for transition, in standing therapist stabilized L knee and manually protracts L hip, visual cues for erect posture.  Cervical nods and rotation is static stand w/cues esp for scanning L.   In standing promoted wt shifting to midline and L w/stabilization/support as above and reaching forward/up by reaching to touch raised basketball hoop w/RUE.  Repeated standing activity x 5.  Pt transported back to room and left oob in wc w/needs in reach, chair tilted back for posture/safety.  Needs in reach.    Therapy Documentation Precautions:  Precautions Precautions: Fall, Other (comment) Precaution Comments: crani helmet when OOB Required Braces or Orthoses: Other Brace Restrictions Weight Bearing Restrictions: No    Therapy/Group: Individual Therapy  Callie Fielding, [place] 09/23/2019, 10:45 AM

## 2019-09-24 NOTE — Progress Notes (Signed)
[facility] PHYSICAL MEDICINE & REHABILITATION PROGRESS NOTE   Subjective/Complaints: Pt reports head hurts and is "nasty" pain- tylenol helps some, a little- tramadol works better but says not due for tramadol as of yet.  Pt frustrated by HA- spoke to wife about possible trigger point injections since muscles in neck/shoulders really tight- she wants to wait and try meds.   ROS: Limited due to cognitive/behavioral     Objective:   [place] Ekg Site Rite  Result Date: 09/23/2019 If Site Rite image not attached, placement could not be confirmed due to current cardiac rhythm.  No results for input(s): WBC, HGB, HCT, PLT in the last 72 hours. Recent Labs    09/22/19 0348  NA 137  K 3.2*  CL 104  CO2 23  GLUCOSE 101*  BUN 8  CREATININE 0.66  CALCIUM 8.9    Intake/Output Summary (Last 24 hours) at 09/24/2019 1315 Last data filed at 09/24/2019 1108 Gross per 24 hour  Intake 3549.84 ml  Output 875 ml  Net 2674.84 ml     Physical Exam: Vital Signs Blood pressure (!) 132/93, pulse 79, temperature 98.5 F (36.9 C), resp. rate 19, height 5\' 11"  (1.803 m), weight 86.1 kg, SpO2 100 %. Constitutional: No distress . Vital signs reviewed. Lying in bed; not wearing helmet; dysphonia still evident- hard to understand/hear whisper, NAD HEENT: EOMI, oral membranes moist Neck: supple, however middle and posterior scalenes and levators and upper traps really tight- R>L Cardiovascular: RRR without murmur. No JVD    Respiratory: CTA Bilaterally without wheezes or rales. Normal effort    GI: BS +, non-tender, non-distended  Skin: area of hypergranulation tissue on posterior aspect of crani incision. No scalp drainage seen today Psych: flat/whispering Musc: No edema in extremities.  No tenderness in extremities. Neuro: Alert, engaging more. Speaking but dysphonic. Leans to left when distracted. Can hold self steady when able to focu 0/5 LUE and LLE. Left inattention persists RUE/RLE appear  to be 5/5 proximal distal   Assessment/Plan: 1. 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  Physiatrist is providing close team supervision and 24 hour management of active medical problems listed below.  Physiatrist and rehab team continue to assess barriers to discharge/monitor patient progress toward functional and medical goals  Care Tool:  Bathing    Body parts bathed by patient: Face, Chest, Abdomen   Body parts bathed by helper: Right arm, Left arm     Bathing assist Assist Level: Moderate Assistance - Patient 50 - 74%(only completed UB bathing)     Upper Body Dressing/Undressing Upper body dressing   What is the patient wearing?: Hospital gown only    Upper body assist Assist Level: Maximal Assistance - Patient 25 - 49%    Lower Body Dressing/Undressing Lower body dressing      What is the patient wearing?: Incontinence brief, Pants     Lower body assist Assist for lower body dressing: Maximal Assistance - Patient 25 - 49%     Toileting Toileting Toileting Activity did not occur (Clothing management and hygiene only): N/A (no void or bm)  Toileting assist Assist for toileting: 2 Helpers     Transfers Chair/bed transfer  Transfers assist     Chair/bed transfer assist level: Moderate Assistance - Patient 50 - 74%     Locomotion Ambulation   Ambulation assist   Ambulation activity did not occur: Safety/medical concerns  Walk 10 feet activity   Assist  Walk 10 feet activity did not occur: Safety/medical concerns        Walk 50 feet activity   Assist Walk 50 feet with 2 turns activity did not occur: Safety/medical concerns         Walk 150 feet activity   Assist Walk 150 feet activity did not occur: Safety/medical concerns         Walk 10 feet on uneven surface  activity   Assist Walk 10 feet on  uneven surfaces activity did not occur: Safety/medical concerns         Wheelchair     Assist Will patient use wheelchair at discharge?: Yes Type of Wheelchair: Manual Wheelchair activity did not occur: Safety/medical concerns         Wheelchair 50 feet with 2 turns activity    Assist    Wheelchair 50 feet with 2 turns activity did not occur: Safety/medical concerns       Wheelchair 150 feet activity     Assist  Wheelchair 150 feet activity did not occur: Safety/medical concerns       Blood pressure (!) 132/93, pulse 79, temperature 98.5 F (36.9 C), resp. rate 19, height 5\' 11"  (1.803 m), weight 86.1 kg, SpO2 100 %. Medical Problem List and Plan: 1.Left-sided weakness/dysphagiasecondary to right MCA/ACA infarction withR ICA,R MCAocclusion with cerebral edema status post hemicraniectomy with abdominal flap implant 123XX123 complicated bycerebral abscess/empyema hemorrhagic conversion  Continue CIR PT, OT, SLP  PRAFO, WHO    -continue to wear helmet when up with therapies     2. Antithrombotics: -DVT/anticoagulation:Right peroneal, left popliteal, left posterior tibial and left peroneal DVT. Status post IVC filter 07/21/2019 with plan for retrieval 8 to 12 weeks. Patient currently remains on subcutaneous heparin. No plan for long-term anticoagulation -antiplatelet therapy: N/A 3. Pain Management:Tramadol, tylenol as needed   -kpad for back  -frequent HA- suggest thinking about preventative for HA-muscles are tight, esp scalenes. 4. Mood:Provide motional support -antipsychotic agents: N/A  -suspect growing depression- begin trial of celexa 10mg  qhs 5. Neuropsych: This patientis notcapable of making decisions on hisown behalf.  -ritalin initiated for arousal/initiation 6. Skin/Wound Care:Routine skin checks  -dress scalp prn 7. Fluids/Electrolytes/Nutrition:Routine in and outs.  Hypokalemia-3.2  10/29---replete  -continue supplement   -prealbumin 26.0 10/19 8. Post stroke dysphagia. upgraded to Dysphagia #2withthin liquids.   -needs cueing/assistance with meals 9. Seizure prophylaxis. Keppra 500 mg twice daily 10. Cerebral abscess/empyema/cerebritis. Cultures no growth to date. Continue IV vancomycin and cefepime 08/20/2019 x 6 to 8 weeks per infectious disease.  Vanc dosing per pharmacy (level 12 10/23)  -scalp wound   culture negative. Repeat looks  negative also   -wbc's 7.9 10/27   -cxr unremarkable   -ua negative, ucx never sent   -NS has seen   -afebrile since 10/28   -suspect scalp wound is fibronecrotic tissue within scalp   -replace PICC today, different arm, single lumen 11. Pseudomonas UTI. Zosyn completed.  12. Hypertension with tachycardia. Lopressor 50 mg every 8 hours.    13. History of tobacco marijuana use. Urine drug screen positive marijuana. Provide counseling with family    LOS: 18 days A FACE TO FACE EVALUATION WAS PERFORMED  Murdock Jellison 09/24/2019, 1:15 PM

## 2019-09-24 NOTE — Progress Notes (Signed)
Pt refused heparin and Pepcid. Pt was educated on possible complications. Pt also refused scheduled tylenol, stating 0/10 pain. Per day-shift report, pt does have pain but doesn't always report said pain. Pt has trouble sleeping at night, however does not want to be bothered to take night meds.   Pt currently resting, will continue to monitor.

## 2019-09-25 ENCOUNTER — Inpatient Hospital Stay (HOSPITAL_COMMUNITY): Payer: Medicaid Other | Admitting: Speech Pathology

## 2019-09-25 LAB — BASIC METABOLIC PANEL
Anion gap: 9 (ref 5–15)
BUN: 9 mg/dL (ref 6–20)
CO2: 25 mmol/L (ref 22–32)
Calcium: 9.3 mg/dL (ref 8.9–10.3)
Chloride: 105 mmol/L (ref 98–111)
Creatinine, Ser: 0.64 mg/dL (ref 0.61–1.24)
GFR calc Af Amer: >60 mL/min (ref >60)
GFR calc non Af Amer: >60 mL/min (ref >60)
Glucose, Bld: 103 mg/dL — ABNORMAL HIGH (ref 70–99)
Potassium: 3.5 mmol/L (ref 3.5–5.1)
Sodium: 139 mmol/L (ref 135–145)

## 2019-09-25 NOTE — Progress Notes (Signed)
Speech Language Pathology Daily Session Note  Patient Details  Name: Matthew Black MRN: [id number] Date of Birth: [birth year]  Today's Date: 09/25/2019 SLP Individual Time: 0802-0900 SLP Individual Time Calculation (min): 58 min  Short Term Goals: Week 3: SLP Short Term Goal 1 (Week 3): Pt will consume current diet with minimal overt s/s of aspiration/dysphagia and supervision level cues for use of compensatory swallow strategies. SLP Short Term Goal 2 (Week 3): Patient will demonstrate efficient mastication with complete oral clearance with trials of Dys. 3 textures with Min verbal cues over 2 sessions prior to upgrade. SLP Short Term Goal 3 (Week 3): Given Max A cues, pt will increase vocal intensity at the phrase level to achieve 75% speech intelligibility. SLP Short Term Goal 4 (Week 3): Patient will demonstrate sustained attention to tasks for ~10 minutes with Max verbal cues for redirection. SLP Short Term Goal 5 (Week 3): Patient will scan to midline/left field of enviornment to locate functional items in 5 out of 10 opportunities with Mod A multimodal cues. SLP Short Term Goal 6 (Week 3): Patient will demonstrate basic problem solving with Max A multimodal cues during functional tasks.  Skilled Therapeutic Interventions:  Pt was seen for skilled ST targeting goals for dysphagia and cognition.  Pt was in bed upon arrival and had not yet eaten breakfast.  Therapist repositioned pt in bed to maximize safe PO intake.  Pt consumed his breakfast of dys 3 textures and thin liquids with min assist multimodal cues for use of swallowing precautions.  No overt s/s of aspiration were evident with solids or liquids.  Pt also demonstrated appropriate clearance of solids and liquids from the oral cavity post swallow. Pt required more than a reasonable amount of time and mod assist verbal cues for sequencing and problem solving of self feeding due to decreased sustained attention to meal.  Upon completion of  meal, SLP provided set up assist for oral care which pt then completed with mod assist multimodal cues for task initiation and sequencing.  Pt was left in bed at the end of today's session with bed alarm set and call bell within reach.  Continue per current plan of care.    Pain Pain Assessment Pain Scale: Faces Faces Pain Scale: Hurts a little bit Pain Type: Acute pain Pain Location: Neck Pain Descriptors / Indicators: Tightness Pain Intervention(s): Repositioned;Heat applied  Therapy/Group: Individual Therapy  Matthew Black, Selinda Orion 09/25/2019, 9:27 AM

## 2019-09-25 NOTE — Progress Notes (Signed)
[facility] PHYSICAL MEDICINE & REHABILITATION PROGRESS NOTE   Subjective/Complaints: Pt asleep- didn't want to wake up- wife wanted to speak with Dr Naaman Plummer prior to getting TrP injections- explained it's up to Dr Naaman Plummer- per nurse, pt had HA earlier, rating it 3-4/10.    ROS: Limited due to cognitive/behavioral/sedation     Objective:   No results found. No results for input(s): WBC, HGB, HCT, PLT in the last 72 hours. Recent Labs    09/25/19 0333  NA 139  K 3.5  CL 105  CO2 25  GLUCOSE 103*  BUN 9  CREATININE 0.64  CALCIUM 9.3    Intake/Output Summary (Last 24 hours) at 09/25/2019 1334 Last data filed at 09/25/2019 1038 Gross per 24 hour  Intake 3284.76 ml  Output 1800 ml  Net 1484.76 ml     Physical Exam: Vital Signs Blood pressure 117/87, pulse 83, temperature 98.4 F (36.9 C), resp. rate 19, height 5\' 11"  (1.803 m), weight 86.1 kg, SpO2 100 %. Constitutional: No distress . Vital signs reviewed. Lying in bed; not wearing helmet; asleep- stirred, but didn't wake to verbal/tactile stimuli, NAD HEENT: EOMI, oral membranes moist Neck:  however still middle and posterior scalenes and levators and upper traps really tight- R>L Cardiovascular: RRR without murmur. No JVD    Respiratory: CTA Bilaterally without wheezes or rales. Normal effort    GI: BS +, non-tender, non-distended  Skin: area of hypergranulation tissue on posterior aspect of crani incision. No scalp drainage seen today Psych: asleep Musc: No edema in extremities.  No tenderness in extremities. Neuro: Alert, engaging more. Speaking but dysphonic. Leans to left when distracted. Can hold self steady when able to focu 0/5 LUE and LLE. Left inattention persists RUE/RLE appear to be 5/5 proximal distal   Assessment/Plan: 1. 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  Physiatrist is providing  close team supervision and 24 hour management of active medical problems listed below.  Physiatrist and rehab team continue to assess barriers to discharge/monitor patient progress toward functional and medical goals  Care Tool:  Bathing    Body parts bathed by patient: Face, Chest, Abdomen   Body parts bathed by helper: Right arm, Left arm     Bathing assist Assist Level: Moderate Assistance - Patient 50 - 74%(only completed UB bathing)     Upper Body Dressing/Undressing Upper body dressing   What is the patient wearing?: Hospital gown only    Upper body assist Assist Level: Maximal Assistance - Patient 25 - 49%    Lower Body Dressing/Undressing Lower body dressing      What is the patient wearing?: Incontinence brief, Pants     Lower body assist Assist for lower body dressing: Maximal Assistance - Patient 25 - 49%     Toileting Toileting Toileting Activity did not occur (Clothing management and hygiene only): N/A (no void or bm)  Toileting assist Assist for toileting: 2 Helpers     Transfers Chair/bed transfer  Transfers assist     Chair/bed transfer assist level: Moderate Assistance - Patient 50 - 74%     Locomotion Ambulation   Ambulation assist   Ambulation activity did not occur: Safety/medical concerns          Walk 10 feet activity   Assist  Walk 10 feet activity did not occur: Safety/medical concerns        Walk 50 feet activity   Assist  Walk 50 feet with 2 turns activity did not occur: Safety/medical concerns         Walk 150 feet activity   Assist Walk 150 feet activity did not occur: Safety/medical concerns         Walk 10 feet on uneven surface  activity   Assist Walk 10 feet on uneven surfaces activity did not occur: Safety/medical concerns         Wheelchair     Assist Will patient use wheelchair at discharge?: Yes Type of Wheelchair: Manual Wheelchair activity did not occur: Safety/medical  concerns         Wheelchair 50 feet with 2 turns activity    Assist    Wheelchair 50 feet with 2 turns activity did not occur: Safety/medical concerns       Wheelchair 150 feet activity     Assist  Wheelchair 150 feet activity did not occur: Safety/medical concerns       Blood pressure 117/87, pulse 83, temperature 98.4 F (36.9 C), resp. rate 19, height 5\' 11"  (1.803 m), weight 86.1 kg, SpO2 100 %. Medical Problem List and Plan: 1.Left-sided weakness/dysphagiasecondary to right MCA/ACA infarction withR ICA,R MCAocclusion with cerebral edema status post hemicraniectomy with abdominal flap implant 123XX123 complicated bycerebral abscess/empyema hemorrhagic conversion  Continue CIR PT, OT, SLP  PRAFO, WHO    -continue to wear helmet when up with therapies     2. Antithrombotics: -DVT/anticoagulation:Right peroneal, left popliteal, left posterior tibial and left peroneal DVT. Status post IVC filter 07/21/2019 with plan for retrieval 8 to 12 weeks. Patient currently remains on subcutaneous heparin. No plan for long-term anticoagulation -antiplatelet therapy: N/A 3. Pain Management:Tramadol, tylenol as needed   -kpad for back  -frequent HA- suggest thinking about preventative for HA-muscles are tight, esp scalenes. 4. Mood:Provide motional support -antipsychotic agents: N/A  -suspect growing depression- begin trial of celexa 10mg  qhs 5. Neuropsych: This patientis notcapable of making decisions on hisown behalf.  -ritalin initiated for arousal/initiation 6. Skin/Wound Care:Routine skin checks  -dress scalp prn 7. Fluids/Electrolytes/Nutrition:Routine in and outs.  Hypokalemia-3.2 10/29---replete  -continue supplement   -prealbumin 26.0 10/19 8. Post stroke dysphagia. upgraded to Dysphagia #2withthin liquids.   -needs cueing/assistance with meals 9. Seizure prophylaxis. Keppra 500 mg twice  daily 10. Cerebral abscess/empyema/cerebritis. Cultures no growth to date. Continue IV vancomycin and cefepime 08/20/2019 x 6 to 8 weeks per infectious disease.  Vanc dosing per pharmacy (level 12 10/23)  -scalp wound   culture negative. Repeat looks  negative also   -wbc's 7.9 10/27   -cxr unremarkable   -ua negative, ucx never sent   -NS has seen   -afebrile since 10/28   -suspect scalp wound is fibronecrotic tissue within scalp   -replace PICC today, different arm, single lumen 11. Pseudomonas UTI. Zosyn completed.  12. Hypertension with tachycardia. Lopressor 50 mg every 8 hours.    13. History of tobacco marijuana use. Urine drug screen positive marijuana. Provide counseling with family    LOS: 19 days A FACE TO FACE EVALUATION WAS PERFORMED  Matthew Black 09/25/2019, 1:34 PM

## 2019-09-25 NOTE — Progress Notes (Signed)
Pt refused heparin during shift. Pt stated that he wants on-coming day shift nurse to administer med. Will alert nurse.

## 2019-09-26 ENCOUNTER — Inpatient Hospital Stay (HOSPITAL_COMMUNITY): Payer: Medicaid Other | Admitting: Speech Pathology

## 2019-09-26 ENCOUNTER — Inpatient Hospital Stay (HOSPITAL_COMMUNITY): Payer: Medicaid Other | Admitting: Physical Therapy

## 2019-09-26 ENCOUNTER — Inpatient Hospital Stay (HOSPITAL_COMMUNITY): Payer: Medicaid Other | Admitting: Occupational Therapy

## 2019-09-26 ENCOUNTER — Inpatient Hospital Stay (HOSPITAL_COMMUNITY): Payer: Medicaid Other

## 2019-09-26 DIAGNOSIS — R55 Syncope and collapse: Secondary | ICD-10-CM

## 2019-09-26 LAB — COMPREHENSIVE METABOLIC PANEL
ALT: 38 U/L (ref 0–44)
AST: 17 U/L (ref 15–41)
Albumin: 2.6 g/dL — ABNORMAL LOW (ref 3.5–5.0)
Alkaline Phosphatase: 67 U/L (ref 38–126)
Anion gap: 8 (ref 5–15)
BUN: 9 mg/dL (ref 6–20)
CO2: 21 mmol/L — ABNORMAL LOW (ref 22–32)
Calcium: 7.3 mg/dL — ABNORMAL LOW (ref 8.9–10.3)
Chloride: 111 mmol/L (ref 98–111)
Creatinine, Ser: 0.48 mg/dL — ABNORMAL LOW (ref 0.61–1.24)
GFR calc Af Amer: >60 mL/min (ref >60)
GFR calc non Af Amer: >60 mL/min (ref >60)
Glucose, Bld: 98 mg/dL (ref 70–99)
Potassium: 3 mmol/L — ABNORMAL LOW (ref 3.5–5.1)
Sodium: 140 mmol/L (ref 135–145)
Total Bilirubin: 0.3 mg/dL (ref 0.3–1.2)
Total Protein: 5.4 g/dL — ABNORMAL LOW (ref 6.5–8.1)

## 2019-09-26 LAB — CBC
HCT: 38.4 % — ABNORMAL LOW (ref 39.0–52.0)
Hemoglobin: 12.3 g/dL — ABNORMAL LOW (ref 13.0–17.0)
MCH: 27.5 pg (ref 26.0–34.0)
MCHC: 32 g/dL (ref 30.0–36.0)
MCV: 85.9 fL (ref 80.0–100.0)
Platelets: 245 10*3/uL (ref 150–400)
RBC: 4.47 MIL/uL (ref 4.22–5.81)
RDW: 14.3 % (ref 11.5–15.5)
WBC: 6 10*3/uL (ref 4.0–10.5)
nRBC: 0 % (ref 0.0–0.2)

## 2019-09-26 LAB — GLUCOSE, CAPILLARY: Glucose-Capillary: 116 mg/dL — ABNORMAL HIGH (ref 70–99)

## 2019-09-26 MED ORDER — BACLOFEN 5 MG HALF TABLET
5.0000 mg | ORAL_TABLET | Freq: Two times a day (BID) | ORAL | Status: DC
  Administered 2019-09-26 – 2019-10-04 (×17): 5 mg via ORAL
  Filled 2019-09-26 (×17): qty 1

## 2019-09-26 NOTE — Progress Notes (Signed)
Pharmacy Antibiotic Note  Matthew Black is a 43 y.o. male transferred from acute to CIR on  09/06/2019.  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  Pharmacy has been consulted to continue dosing/monitoring cefepime and vancomycin for brain abscess/cerebritis.   Pt was started on vancomycin and cefepime on 08/20/19, with planned duration of 6-8 weeks per ID. On transfer to CIR, he was receiving cefepime 2 gm IV Q 8 hrs and vancomycin 1250 mg IV Q 8 hrs, with goal vancomycin trough level of 15-20 mg/L.   10/16 Vancomycin trough = 21 10/19 Vancomycin trough = 8 (no missed doses) - unsure reason for the dramatic drop in trough. Will conservatively increase. 10/23 Vancomycin trough = 49 (drawn correctly) 10/23 Vancomycin random level (drawn ~19.5 hrs after last 1500 mg IV dose and drawn ~14 hrs after level of 49 mg/L) = 12 mg/L; using these levels, vanc ke=0.1016; half-life=6.8 hrs  (Scr drawn at same time has decreased to 0.62, which is more in line with his baseline of ~0.60) 10/26 AM: Vancomycin trough of 11 was drawn 4 hours late, estimated true trough to be ~15 10/29 VT = 16  11/1: Scr 0.64 (stable)   Plan: Cont Vancomycin 1500 mg IV q12h (goal vanc trough of 15-20 mg/L) Continue Cefepime 2g IV q8h Re-check levels as needed  Height: 5\' 11"  (180.3 cm) Weight: 189 lb 13.1 oz (86.1 kg) IBW/kg (Calculated) : 75.3  Temp (24hrs), Avg:98.7 F (37.1 C), Min:98.4 F (36.9 C), Max:99.2 F (37.3 C)  Recent Labs  Lab 09/20/19 0423 09/22/19 0348 09/23/19 1348  09/25/19 0333  WBC 7.9  --   --   --   CREATININE  --  0.66  --  0.64  VANCOTROUGH  --   --  16  --     Estimated Creatinine Clearance: 129.4 mL/min (by C-G formula based on SCr of 0.64 mg/dL).    Allergies  Allergen Reactions  . Hydrocodone Nausea Only    Antimicrobials this admission: 9/21 Zosyn >> 9/26 9/26 Cefepime >> (11/20) 9/26 Vancomycin >> (11/20)  Microbiology results: 9/27 BCx X 2: NG/final 9/17  UCx: Pseudomonas aeruginosa (suscept to Zosyn) 9/14  Abdominal fluid: Gram stain: rate WBCs, NOS; cx: NG/final 9/27 Epidural abscess: Gram stain: mod WBCs (PMNs), NOS; cx: NG/final 9/27 MRSA PCR: positive  Rayanne Padmanabhan A. Levada Dy, PharmD, BCPS, FNKF Clinical Pharmacist [place] Please utilize Amion for appropriate phone number to reach the unit pharmacist ([place])   09/26/2019 10:30 AM

## 2019-09-26 NOTE — Progress Notes (Signed)
Speech Language Pathology Daily Session Note  Patient Details  Name: Matthew Black MRN: [id number] Date of Birth: 1976-03-18  Today's Date: 09/26/2019 SLP Individual Time: 1330-1430 SLP Individual Time Calculation (min): 60 min  Short Term Goals: Week 3: SLP Short Term Goal 1 (Week 3): Pt will consume current diet with minimal overt s/s of aspiration/dysphagia and supervision level cues for use of compensatory swallow strategies. SLP Short Term Goal 2 (Week 3): Patient will demonstrate efficient mastication with complete oral clearance with trials of Dys. 3 textures with Min verbal cues over 2 sessions prior to upgrade. SLP Short Term Goal 3 (Week 3): Given Max A cues, pt will increase vocal intensity at the phrase level to achieve 75% speech intelligibility. SLP Short Term Goal 4 (Week 3): Patient will demonstrate sustained attention to tasks for ~10 minutes with Max verbal cues for redirection. SLP Short Term Goal 5 (Week 3): Patient will scan to midline/left field of enviornment to locate functional items in 5 out of 10 opportunities with Mod A multimodal cues. SLP Short Term Goal 6 (Week 3): Patient will demonstrate basic problem solving with Max A multimodal cues during functional tasks.  Skilled Therapeutic Interventions: Skilled treatment session focused on cognitive goals. Per RN, patient has had a "rough" day and requiring encouragement for participation in therapy, taking medications and eating his meals. SLP facilitated session by providing extra time and Max encouragement for patient to initiate verbal responses to basic questions from SLP. Patient's shirt was wet due to incontinent episode and Max A verbal cues were required for sequencing and initiating of changing his shirt. SLP also facilitated session by administering the MoCA (version 7.1). Patient scored 16/30 points with a score of 26 or above considered normal. Patient demonstrates deficits in attention, executive functioning and  visual-spatial tasks. Patient's wife present and educated on results. Patient transferred back to bed at end of session per his request. Patient left supine in bed with alarm on and all needs within reach. Continue with current plan of care.      Pain No/Denies Pain   Therapy/Group: Individual Therapy  Matthew Black 09/26/2019, 2:35 PM

## 2019-09-26 NOTE — Progress Notes (Addendum)
Pt. Refused tylenol, heparin. States, only wants day shift nurse "Matthew Black" to administer heparin. Pt took extended amount of time to take medication. Educated patient on importance of medication.

## 2019-09-26 NOTE — Progress Notes (Signed)
Occupational Therapy Session Note  Patient Details  Name: Matthew Black MRN: [id number] Date of Birth: 08/20/1976  Today's Date: 09/26/2019 OT Individual Time: [id number] OT Individual Time Calculation (min): 60 min    Short Term Goals: Week 3:  OT Short Term Goal 1 (Week 3): Pt will complete sit<>stand at the sink with max A of 1 person OT Short Term Goal 2 (Week 3): Pt will complete 1 step of UB dressing task OT Short Term Goal 3 (Week 3): Pt demonstrate self-ROM of L UE with mod instructional cues  Skilled Therapeutic Interventions/Progress Updates:    Pt resting in bed upon arrival and acknowledged therapist.  Pt with difficulty keeping eyes open in bed but engaged appropriately with therapist once engaged in therapy.  Pt incontinent of bowel and required tot A for hygiene and donning brief.  Bed mobility with max A.  Stand pivot transfer with mod A.  Pt engaged in bathing/dressing tasks with sit<>stand from w/c at sink.  Pt required max verbal cues for task initiation and sequencing.  Pt with trace LUE grasp but unable to engage in funcitonal task. Pt turning head to L past midline to locate items with max verbal cues. Sit<>stand from w/c with mod A. Pt remained in TIS w/c with all needs within reach, belt alarm activated, and lap tray in place.  Therapy Documentation Precautions:  Precautions Precautions: Fall, Other (comment) Precaution Comments: crani helmet when OOB Required Braces or Orthoses: Other Brace Restrictions Weight Bearing Restrictions: No Pain: Pain Assessment Pain Scale: 0-10 Pain Score: 0-No pain  Therapy/Group: Individual Therapy  Leroy Libman 09/26/2019, 10:20 AM

## 2019-09-26 NOTE — Progress Notes (Signed)
Occupational Therapy Session Note  Patient Details  Name: Matthew Black MRN: [id number] Date of Birth: 06-28-1976  Today's Date: 09/26/2019 OT Individual Time: 1257-1330 OT Individual Time Calculation (min): 33 min    Short Term Goals: Week 3:  OT Short Term Goal 1 (Week 3): Pt will complete sit<>stand at the sink with max A of 1 person OT Short Term Goal 2 (Week 3): Pt will complete 1 step of UB dressing task OT Short Term Goal 3 (Week 3): Pt demonstrate self-ROM of L UE with mod instructional cues  Skilled Therapeutic Interventions/Progress Updates:    Patient in bed, states that he is tired.  Completed left UE PROM.  Attempted to use urinal but had already had an incontinent episode soaking brief, clothing and bedding.  Max to roll side to side for dependent clean up and change of lower body clothing in bed.  Donned patient's helmet - he complains of pressure on right side of head from helmet.  Supine to SSP max A of one, sit pivot transfer bed to TIS w/c max A of min a of 2nd person.  Patient tilted to comfortable position in w/c - SLP to see for next session.  Wife present.    Therapy Documentation Precautions:  Precautions Precautions: Fall, Other (comment) Precaution Comments: crani helmet when OOB Required Braces or Orthoses: Other Brace Restrictions Weight Bearing Restrictions: No General:   Vital Signs: Therapy Vitals Temp: 98.5 F (36.9 C) Temp Source: Oral Pulse Rate: 82 Resp: 17 BP: 116/79 Patient Position (if appropriate): Sitting Oxygen Therapy SpO2: 100 % O2 Device: Room Air Pain: Pain Assessment Faces Pain Scale: Hurts a little bit Pain Location: Head Pain Intervention(s): Repositioned  Therapy/Group: Individual Therapy  Carlos Levering 09/26/2019, 3:43 PM

## 2019-09-26 NOTE — Progress Notes (Addendum)
[place] PHYSICAL MEDICINE & REHABILITATION PROGRESS NOTE   Subjective/Complaints: Pt c/o ongoing low back and now some neck pain. Denied any headache. After I left him, he went to gym where became sweaty and unresponsive for about 10 seconds. Pt could not tell me what was distressing him after we got him back into bed  ROS: Patient denies fever, rash, sore throat, blurred vision, nausea, vomiting, diarrhea, cough, shortness of breath or chest pain, joint or back pain, headache, or mood change.    Objective:   No results found. No results for input(s): WBC, HGB, HCT, PLT in the last 72 hours. Recent Labs    09/25/19 0333  NA 139  K 3.5  CL 105  CO2 25  GLUCOSE 103*  BUN 9  CREATININE 0.64  CALCIUM 9.3    Intake/Output Summary (Last 24 hours) at 09/26/2019 0841 Last data filed at 09/26/2019 0738 Gross per 24 hour  Intake 3058.76 ml  Output 700 ml  Net 2358.76 ml     Physical Exam: Vital Signs Blood pressure 134/88, pulse 80, temperature 98.5 F (36.9 C), resp. rate 18, height 5\' 11"  (1.803 m), weight 86.1 kg, SpO2 100 %. Constitutional: flat, diaphoretic, Vital signs reviewed. HEENT: EOMI, oral membranes moist Neck: supple Cardiovascular: RRR without murmur. No JVD    Respiratory: CTA Bilaterally without wheezes or rales. Normal effort    GI: BS +, non-tender, non-distended  Skin: area of hypergranulation tissue on posterior aspect of crani incision. No scalp drainage evident today. Skin cool and clammy Psych: asleep Musc: No edema in extremities.  No tenderness in extremities. Neuro: Alert, engaging more. Speaking but dysphonic. Leans to left when distracted. Can hold self steady when able to focu 0/5 LUE and LLE. Left inattention persists, tone 1+/4 LUE and LLE RUE/RLE appear to be 5/5 proximal distal   Assessment/Plan: 1. Functional deficits secondary to right MCA/ACA infarct which require 3+ hours per day of interdisciplinary therapy in a comprehensive  inpatient rehab setting.  Physiatrist is providing close team supervision and 24 hour management of active medical problems listed below.  Physiatrist and rehab team continue to assess barriers to discharge/monitor patient progress toward functional and medical goals  Care Tool:  Bathing    Body parts bathed by patient: Face, Chest, Abdomen   Body parts bathed by helper: Right arm, Left arm     Bathing assist Assist Level: Moderate Assistance - Patient 50 - 74%(only completed UB bathing)     Upper Body Dressing/Undressing Upper body dressing   What is the patient wearing?: Hospital gown only    Upper body assist Assist Level: Maximal Assistance - Patient 25 - 49%    Lower Body Dressing/Undressing Lower body dressing      What is the patient wearing?: Incontinence brief, Pants     Lower body assist Assist for lower body dressing: Maximal Assistance - Patient 25 - 49%     Toileting Toileting Toileting Activity did not occur (Clothing management and hygiene only): N/A (no void or bm)  Toileting assist Assist for toileting: 2 Helpers     Transfers Chair/bed transfer  Transfers assist     Chair/bed transfer assist level: 2 Helpers     Locomotion Ambulation   Ambulation assist   Ambulation activity did not occur: Safety/medical concerns          Walk 10 feet activity   Assist  Walk 10 feet activity did not occur: Safety/medical concerns        Walk 50  feet activity   Assist Walk 50 feet with 2 turns activity did not occur: Safety/medical concerns         Walk 150 feet activity   Assist Walk 150 feet activity did not occur: Safety/medical concerns         Walk 10 feet on uneven surface  activity   Assist Walk 10 feet on uneven surfaces activity did not occur: Safety/medical concerns         Wheelchair     Assist Will patient use wheelchair at discharge?: Yes Type of Wheelchair: Manual Wheelchair activity did not occur:  Safety/medical concerns         Wheelchair 50 feet with 2 turns activity    Assist    Wheelchair 50 feet with 2 turns activity did not occur: Safety/medical concerns       Wheelchair 150 feet activity     Assist  Wheelchair 150 feet activity did not occur: Safety/medical concerns       Blood pressure 134/88, pulse 80, temperature 98.5 F (36.9 C), resp. rate 18, height 5\' 11"  (1.803 m), weight 86.1 kg, SpO2 100 %. Medical Problem List and Plan: 1.Left-sided weakness/dysphagiasecondary to right MCA/ACA infarction withR ICA,R MCAocclusion with cerebral edema status post hemicraniectomy with abdominal flap implant [id number] complicated bycerebral abscess/empyema hemorrhagic conversion  Continue CIR PT, OT, SLP  PRAFO, WHO    -continue to wear helmet when up with therapies     2. Antithrombotics: -DVT/anticoagulation:Right peroneal, left popliteal, left posterior tibial and left peroneal DVT. Status post IVC filter 07/21/2019 with plan for retrieval 8 to 12 weeks. Patient currently remains on subcutaneous heparin. No plan for long-term anticoagulation -antiplatelet therapy: N/A 3. Pain Management:Tramadol, tylenol as needed   -kpad for back and neck  -begin scheduled baclofen for spasticity, may help neck and back pain too  -may continue prn robaxin 4. Mood:Provide motional support -antipsychotic agents: N/A  -suspect growing depression- trial of celexa 10mg  qhs initiated 5. Neuropsych: This patientis notcapable of making decisions on hisown behalf.  -ritalin initiated for arousal/initiation 6. Skin/Wound Care:Routine skin checks  -dress scalp prn 7. Fluids/Electrolytes/Nutrition:Routine in and outs.  Hypokalemia-3.2 10/29---replete  -continue supplement   -prealbumin 26.0 10/19 8. Post stroke dysphagia. upgraded to Dysphagia #2withthin liquids.   -needs cueing/assistance with meals 9. Seizure  prophylaxis. Keppra 500 mg twice daily 10. Cerebral abscess/empyema/cerebritis. Cultures no growth to date. Continue IV vancomycin and cefepime 08/20/2019 x 6 to 8 weeks per infectious disease.  Vanc dosing per pharmacy (level 12 10/23)  -scalp wound   culture negative. Repeat looks  negative also   -wbc's 7.9 10/27   -cxr unremarkable   -ua negative, ucx never sent   -NS has seen   -afebrile since 10/28   -suspect scalp wound is fibronecrotic tissue within scalp   -replaced PICC today  single lumen 10/30 11. Pseudomonas UTI. Zosyn completed.  12. Hypertension with tachycardia. Lopressor 50 mg every 8 hours.    13. History of tobacco marijuana use. Urine drug screen positive marijuana. Provide counseling with family  80. Syncopal episode:  -VSS, sats 100%  -no evidence of sz  -check labs today  -appeared to be improving while examining pt in bed again   Greater than 35 total minutes was spent in examination of patient, assessment of pertinent data,  formulation of a treatment plan, and in discussion with patient and/or family.      LOS: 20 days A FACE TO FACE EVALUATION WAS PERFORMED  Meredith Staggers 09/26/2019,  8:41 AM

## 2019-09-26 NOTE — Progress Notes (Signed)
Physical Therapy Session Note  Patient Details  Name: Matthew Black MRN: [id number] Date of Birth: February 04, 1976  Today's Date: 09/26/2019 PT Individual Time: [id number] PT Individual Time Calculation (min): 28 min   Short Term Goals: Week 3:  PT Short Term Goal 1 (Week 3): Pt will initiate gait training PT Short Term Goal 2 (Week 3): Pt will transfer bed<>chair w/ max assist +1 consistently PT Short Term Goal 3 (Week 3): Pt will perform bed mobility w/ max assist +1 consistently PT Short Term Goal 4 (Week 3): Pt will initiate self-propulsion of w/c  Skilled Therapeutic Interventions/Progress Updates:  Pt received in bed & agreeable to tx. Pt completes supine>sittting EOB with max assist with cuing for technique. Therapist dons helmet total assist. Pt transfers bed>w/c on R with +2 assist (pt only requires max assist overall) via stand pivot with assistance for turning & placement into w/c seat. In gym, pt transfers w/c>mat table on L with max assist +2t via stand pivot and then transfers into stedy with max assist. Pt engaged in reaching for horseshoes outside of BOS while standing in stedy with max assist with cuing to correct L lateral lean. After reaching for ~5 horseshoes pt returns to sitting & reports "I'm cold". Therapist then begins to observe pt with eyes rolling towards back of head, not responding to question, and RUE slightly twitching, & pt unable to control secretions out of mouth with event lasting ~10 seconds then pt responding. Nursing staff called & pt assisted back to room with stedy lift & sit>supine with total assist. Assisted pt with positioning in bed with MD & nurse in room assessing pt. Pt left in bed with nursing staff present.  Therapy Documentation Precautions:  Precautions Precautions: Fall, Other (comment) Precaution Comments: crani helmet when OOB Required Braces or Orthoses: Other Brace Restrictions Weight Bearing Restrictions: No   Pain: No behaviors  demonstrating pain.  General: Pt missed 17 minutes of skilled PT treatment 2/2 medical issues.   Therapy/Group: Individual Therapy  [name] 09/26/2019, 8:41 AM

## 2019-09-27 ENCOUNTER — Inpatient Hospital Stay (HOSPITAL_COMMUNITY): Payer: Medicaid Other | Admitting: Speech Pathology

## 2019-09-27 ENCOUNTER — Inpatient Hospital Stay (HOSPITAL_COMMUNITY): Payer: Medicaid Other | Admitting: Physical Therapy

## 2019-09-27 ENCOUNTER — Inpatient Hospital Stay (HOSPITAL_COMMUNITY): Payer: Medicaid Other | Admitting: Occupational Therapy

## 2019-09-27 ENCOUNTER — Inpatient Hospital Stay (HOSPITAL_COMMUNITY): Payer: Medicaid Other

## 2019-09-27 MED ORDER — POTASSIUM CHLORIDE CRYS ER 20 MEQ PO TBCR
40.0000 meq | EXTENDED_RELEASE_TABLET | Freq: Two times a day (BID) | ORAL | Status: DC
  Administered 2019-09-27 – 2019-10-07 (×19): 40 meq via ORAL
  Filled 2019-09-27 (×21): qty 2

## 2019-09-27 MED ORDER — CITALOPRAM HYDROBROMIDE 20 MG PO TABS
20.0000 mg | ORAL_TABLET | Freq: Every day | ORAL | Status: DC
  Administered 2019-09-27 – 2019-10-06 (×10): 20 mg via ORAL
  Filled 2019-09-27 (×10): qty 1

## 2019-09-27 NOTE — Progress Notes (Signed)
Speech Language Pathology Daily Session Note  Patient Details  Name: Matthew Black MRN: [id number] Date of Birth: 1976-10-07  Today's Date: 09/27/2019 SLP Individual Time: [id number] SLP Individual Time Calculation (min): 45 min  Short Term Goals: Week 3: SLP Short Term Goal 1 (Week 3): Pt will consume current diet with minimal overt s/s of aspiration/dysphagia and supervision level cues for use of compensatory swallow strategies. SLP Short Term Goal 2 (Week 3): Patient will demonstrate efficient mastication with complete oral clearance with trials of Dys. 3 textures with Min verbal cues over 2 sessions prior to upgrade. SLP Short Term Goal 3 (Week 3): Given Max A cues, pt will increase vocal intensity at the phrase level to achieve 75% speech intelligibility. SLP Short Term Goal 4 (Week 3): Patient will demonstrate sustained attention to tasks for ~10 minutes with Max verbal cues for redirection. SLP Short Term Goal 5 (Week 3): Patient will scan to midline/left field of enviornment to locate functional items in 5 out of 10 opportunities with Mod A multimodal cues. SLP Short Term Goal 6 (Week 3): Patient will demonstrate basic problem solving with Max A multimodal cues during functional tasks.  Skilled Therapeutic Interventions: Skilled treatment session focused on dysphagia and cognitive goals. Upon arrival, patient was upright in the wheelchair with his wife present. Patient's wife reported that the patient stated he had a "good morning" but SLP provided education in regards to events from morning therapy sessions with Mod-Max questioning cues needed for patient to recall events as documented in OT/PT notes. SLP also facilitated a functional conversation with the patient and his wife in regards to his current overall function and importance of continuing to push himself in order to maximize independence. Patient verbalized understanding but also voiced frustration with not being able to see or hug  his children, emotional support provided. Patient consumed his lunch meal of Dys. 3 textures with thin liquids with complete oral clearance and without overt s/s of aspiration with Min verbal cues needed to self-monitor and correct anterior spillage. Recommend patient continue current diet. Min verbal cues were also provided for sustained attention to self-feeding for ~30 minutes. Patient left upright in wheelchair with alarm on and all needs within reach. Continue with current plan of care.      Pain No/Denies Pain   Therapy/Group: Individual Therapy  Matthew Black 09/27/2019, 2:25 PM

## 2019-09-27 NOTE — Progress Notes (Signed)
Physical Therapy Weekly Progress Note  Patient Details  Name: Matthew Black MRN: [id number] Date of Birth: 08-28-1976  Beginning of progress report period: September 21, 2019 End of progress report period: September 27, 2019  Today's Date: 09/27/2019   Patient has met 2 of 4 short term goals.  Pt is making very slow progress towards LTG's as he continues to require max assist for bed mobility & max assist for stand pivot transfers to w/c. Per team discussion it appears pt is becoming more depressed re: current situation & MD aware. Now planning to initiate family training with pt's wife with plans to teach her how to assist pt with bed mobility & transfers. Pt's family will need several days of hands on training to ensure safety for pt & caregivers prior to d/c. Pt's wife is aware that pt will need a ramp at their house for increased ease of mobility at d/c.  Patient continues to demonstrate the following deficits muscle weakness, decreased cardiorespiratoy endurance, decreased coordination, decreased visual perceptual skills, decreased attention to left, decreased initiation, decreased attention, decreased awareness, decreased problem solving, decreased safety awareness, decreased memory and delayed processing, and decreased standing balance, decreased postural control, decreased balance strategies and L hemiparesis and therefore will continue to benefit from skilled PT intervention to increase functional independence with mobility.  Patient progressing toward long term goals..  Continue plan of care.  PT Short Term Goals Week 3:  PT Short Term Goal 1 (Week 3): Pt will initiate gait training PT Short Term Goal 1 - Progress (Week 3): Not met PT Short Term Goal 2 (Week 3): Pt will transfer bed<>chair w/ max assist +1 consistently PT Short Term Goal 2 - Progress (Week 3): Met PT Short Term Goal 3 (Week 3): Pt will perform bed mobility w/ max assist +1 consistently PT Short Term Goal 3 - Progress  (Week 3): Met PT Short Term Goal 4 (Week 3): Pt will initiate self-propulsion of w/c PT Short Term Goal 4 - Progress (Week 3): Not met Week 4:  PT Short Term Goal 1 (Week 4): STG = LTG due to estimated d/c date.    Therapy Documentation Precautions:  Precautions Precautions: Fall, Other (comment) Precaution Comments: crani helmet when OOB Required Braces or Orthoses: Other Brace Restrictions Weight Bearing Restrictions: No   Therapy/Group: [place] 09/27/2019, 12:16 PM

## 2019-09-27 NOTE — Progress Notes (Signed)
Occupational Therapy Session Note  Patient Details  Name: Matthew Black MRN: [id number] Date of Birth: February 21, 1976  Today's Date: 09/27/2019 OT Individual Time: 0092-3300 OT Individual Time Calculation (min): 45 min   Short Term Goals: Week 3:  OT Short Term Goal 1 (Week 3): Pt will complete sit<>stand at the sink with max A of 1 person OT Short Term Goal 2 (Week 3): Pt will complete 1 step of UB dressing task OT Short Term Goal 3 (Week 3): Pt demonstrate self-ROM of L UE with mod instructional cues  Skilled Therapeutic Interventions/Progress Updates:      Pt greeted sitting in TIS wc with spouse present and agreeable to OT treatment session. Pt thought he had already been incontinent and agreeable to get to toilet. Pt completed stand-pivot to regular toilet on the R side with min A and use of grab bars. +2 needed for clothing management prior to sitting on commode. Pt with successful BM, but unable to void bladder. Had pt try to complete peri-care, but pt unable to reach buttocks and hike hip enough to clean himself. Sit<>stand with Min A, while +2 completed peri-care and clothing management. TIS wc changed out to standard wc to see if pt could tolerate regular wc. Pt then needed max cues for motor planning and assistance to position LLE to pivot. Pt brought to the sink and worked on L attention and functional use of L UE when washing hands at the sink with min A. OT discussed pt care and dc planning with wife. OT educated pt's spouse on body mechanics of sit<>stand at the sink. Pt's spouse assisted with sit<>stand at the sink with min A. Pt very  fidgitty in regular wc and had difficulty maintain head/neck position. Discussed using TIS wc at home, and purchasing standard wc privately for transporting out of the house. Pt's spouse liked this idea as pt unable to tolerate sitting in regular chair. Pt requested to return to bed. OT educated pt's spouse on body mechanics and positioning for stand-pivot to  the R. Pt's spouse assisted with transfer with min A/mod A to the R. Pt's spouse wants to continue to get as much hands on training as possible. Will continue family ed whenever she is around. Pt left semi-reclined in bed with bed alarm on, spouse present, and needs met.   Therapy Documentation Precautions:  Precautions Precautions: Fall, Other (comment) Precaution Comments: crani helmet when OOB Required Braces or Orthoses: Other Brace Restrictions Weight Bearing Restrictions: No Pain:  Pt winces when OT donning helmet. Pt refuses to wear helmet 2/2 pain at crani site. Improved with rest.  Therapy/Group: Individual Therapy  Valma Cava 09/27/2019, 1:53 PM

## 2019-09-27 NOTE — Progress Notes (Signed)
Physical Therapy Session Note  Patient Details  Name: Matthew Black MRN: [id number] Date of Birth: 1976-06-29  Today's Date: 09/27/2019 PT Individual Time: [id number] PT Individual Time Calculation (min): 24 min   Short Term Goals: Week 3:  PT Short Term Goal 1 (Week 3): Pt will initiate gait training PT Short Term Goal 2 (Week 3): Pt will transfer bed<>chair w/ max assist +1 consistently PT Short Term Goal 3 (Week 3): Pt will perform bed mobility w/ max assist +1 consistently PT Short Term Goal 4 (Week 3): Pt will initiate self-propulsion of w/c  Skilled Therapeutic Interventions/Progress Updates:  Pt received in bed consuming breakfast with RN supervising & transitioning to PT providing supervision. With encouragement pt transferred supine<>sitting EOB with max assist to consume breakfast sitting EOB. Pt requires CGA<>min assist for balance, with increased support provided when pt consuming bite of food & does not have UE support. Pt demonstrates significant forward head & when asked to upright head if possible pt reports "it hurts". Therapist also attempted to don helmet but pt wincing & grimacing in pain so helmet removed & RN & MD made aware. MD cleared pt to participate in therapy today without helmet. After consuming a few bites of breakfast pt returns to lying in bed reporting "y'all are making my head hurt" but wouldn't elaborate (RN made aware) & refusing further participation in therapy despite encouragement from MD & PT. Pt left in bed with alarm set, call bell in reach, & RN in room.  Therapy Documentation Precautions:  Precautions Precautions: Fall, Other (comment) Precaution Comments: crani helmet when OOB Required Braces or Orthoses: Other Brace Restrictions Weight Bearing Restrictions: No General: PT Amount of Missed Time (min): 21 Minutes PT Missed Treatment Reason: Patient unwilling to participate    Therapy/Group: Individual Therapy  [name] 09/27/2019,  8:35 AM

## 2019-09-27 NOTE — Progress Notes (Signed)
[place] PHYSICAL MEDICINE & REHABILITATION PROGRESS NOTE   Subjective/Complaints: Pt stating that he doesn't want to wear helmet today because it irritates head. Doesn't want to get up either.   ROS: Limited due to cognitive/behavioral    Objective:   No results found. Recent Labs    09/26/19 1057  WBC 6.0  HGB 12.3*  HCT 38.4*  PLT 245   Recent Labs    09/25/19 0333 09/26/19 1057  NA 139 140  K 3.5 3.0*  CL 105 111  CO2 25 21*  GLUCOSE 103* 98  BUN 9 9  CREATININE 0.64 0.48*  CALCIUM 9.3 7.3*    Intake/Output Summary (Last 24 hours) at 09/27/2019 0941 Last data filed at 09/27/2019 0837 Gross per 24 hour  Intake 2368.21 ml  Output 1000 ml  Net 1368.21 ml     Physical Exam: Vital Signs Blood pressure 115/81, pulse 75, temperature 98 F (36.7 C), resp. rate 18, height 5\' 11"  (1.803 m), weight 86.1 kg, SpO2 100 %. Constitutional: No distress . Vital signs reviewed. HEENT: EOMI, oral membranes moist Neck: supple Cardiovascular: RRR without murmur. No JVD    Respiratory: CTA Bilaterally without wheezes or rales. Normal effort    GI: BS +, non-tender, non-distended  Skin: area of hypergranulation/scar tissue on posterior aspect of crani incision which can be compressed. Minimal old drainage evident today.  Psych: very flat, won't engage much Musc: No edema in extremities.  No tenderness in extremities. Neuro: Alert, 0/5 LUE and LLE. Left inattention persists, tone 1+/4 LUE and LLE RUE/RLE appear to be 5/5 proximal distal   Assessment/Plan: 1. 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  Physiatrist is providing close team supervision and 24 hour management of active medical problems listed below.  Physiatrist and rehab team continue to assess barriers to discharge/monitor patient progress toward functional and medical goals  Care  Tool:  Bathing    Body parts bathed by patient: Left arm, Chest, Abdomen, Front perineal area, Right upper leg, Left upper leg   Body parts bathed by helper: Right arm, Buttocks, Right lower leg, Left lower leg     Bathing assist Assist Level: Moderate Assistance - Patient 50 - 74%     Upper Body Dressing/Undressing Upper body dressing   What is the patient wearing?: Pull over shirt    Upper body assist Assist Level: Maximal Assistance - Patient 25 - 49%    Lower Body Dressing/Undressing Lower body dressing      What is the patient wearing?: Pants, Incontinence brief     Lower body assist Assist for lower body dressing: Total Assistance - Patient < 25%     Toileting Toileting Toileting Activity did not occur Landscape architect and hygiene only): N/A (no void or bm)  Toileting assist Assist for toileting: Dependent - Patient 0%     Transfers Chair/bed transfer  Transfers assist     Chair/bed transfer assist level: 2 Helpers     Locomotion Ambulation   Ambulation assist   Ambulation activity did not occur: Safety/medical concerns          Walk 10 feet activity   Assist  Walk 10 feet activity did not occur: Safety/medical concerns        Walk 50 feet activity   Assist Walk 50 feet with 2 turns activity did not occur: Safety/medical concerns         Walk 150 feet  activity   Assist Walk 150 feet activity did not occur: Safety/medical concerns         Walk 10 feet on uneven surface  activity   Assist Walk 10 feet on uneven surfaces activity did not occur: Safety/medical concerns         Wheelchair     Assist Will patient use wheelchair at discharge?: Yes Type of Wheelchair: Manual Wheelchair activity did not occur: Safety/medical concerns         Wheelchair 50 feet with 2 turns activity    Assist    Wheelchair 50 feet with 2 turns activity did not occur: Safety/medical concerns       Wheelchair 150 feet  activity     Assist  Wheelchair 150 feet activity did not occur: Safety/medical concerns       Blood pressure 115/81, pulse 75, temperature 98 F (36.7 C), resp. rate 18, height 5\' 11"  (1.803 m), weight 86.1 kg, SpO2 100 %. Medical Problem List and Plan: 1.Left-sided weakness/dysphagiasecondary to right MCA/ACA infarction withR ICA,R MCAocclusion with cerebral edema status post hemicraniectomy with abdominal flap implant [id number] complicated bycerebral abscess/empyema hemorrhagic conversion  Continue CIR PT, OT, SLP  PRAFO, WHO    -continue to wear helmet when up with therapies (can hold today d/t discomfort)     2. Antithrombotics: -DVT/anticoagulation:Right peroneal, left popliteal, left posterior tibial and left peroneal DVT. Status post IVC filter 07/21/2019 with plan for retrieval 8 to 12 weeks. Patient currently remains on subcutaneous heparin. No plan for long-term anticoagulation -antiplatelet therapy: N/A 3. Pain Management:Tramadol, tylenol as needed   -kpad for back and neck  -continue scheduled baclofen for spasticity, may help neck and back pain too  -may continue prn robaxin 4. Mood:Provide motional support -antipsychotic agents: N/A  -suspect emerging depression- trial of celexa 10mg  qhs initiated 5. Neuropsych: This patientis notcapable of making decisions on hisown behalf.  -ritalin initiated for arousal/initiation 6. Skin/Wound Care:Routine skin checks  -dress scalp prn 7. Fluids/Electrolytes/Nutrition:Routine in and outs.  Hypokalemia-3.2 10/29---replete  -continue supplement   -prealbumin 26.0 10/19 8. Post stroke dysphagia. upgraded to Dysphagia #2withthin liquids.   -needs cueing/assistance with meals 9. Seizure prophylaxis. Keppra 500 mg twice daily 10. Cerebral abscess/empyema/cerebritis. Cultures no growth to date. Continue IV vancomycin and cefepime 08/20/2019 x 6 to 8 weeks per  infectious disease.  Vanc dosing per pharmacy (Tr 16 on 10/30)  -scalp wound   culture negative. Repeat looks  negative also   -wbc's 7.9 10/27   -cxr unremarkable   -ua negative, ucx never sent   -NS has seen   -afebrile since 10/28   -suspect scalp wound is fibronecrotic tissue within scalp    -PICC replaced at new site 11. Pseudomonas UTI. Zosyn completed.  12. Hypertension with tachycardia. Lopressor 50 mg every 8 hours.    13. History of tobacco marijuana use. Urine drug screen positive marijuana. Provide counseling with family  37. Syncopal episode:  -likely vasovagal  -labs really unremarkable except for hypokalemia  -no evidence of szs  -appears at baseline today      LOS: 21 days A FACE TO FACE EVALUATION WAS PERFORMED  Meredith Staggers 09/27/2019, 9:41 AM

## 2019-09-27 NOTE — Progress Notes (Signed)
Occupational Therapy Session Note  Patient Details  Name: Matthew Black MRN: [id number] Date of Birth: 08-29-1976  Today's Date: 09/27/2019 OT Individual Time: 8548-8301 OT Individual Time Calculation (min): 53 min    Short Term Goals: Week 2:  OT Short Term Goal 1 (Week 2): Pt will maintain sitting balance with max A of 1 person OT Short Term Goal 1 - Progress (Week 2): Met OT Short Term Goal 2 (Week 2): Pt will complete UB dressing with max A of 1 OT Short Term Goal 2 - Progress (Week 2): Met OT Short Term Goal 3 (Week 2): Pt will locate grooming items on L side of the sink with mod verbal cues OT Short Term Goal 3 - Progress (Week 2): Met  Skilled Therapeutic Interventions/Progress Updates:    Pt resting in bed upon arrival. Pt required max verbal cues to verbally respond to questions.  OT intervention with focus on bed mobility, functional transfers, sit<>stand, standing balance, attention to L, BADL retraining, and activity tolerance to increase independence with BADLs. Pt required mod A for rolling in bed this morning.    Supine>sit EOB-max A Static sitting balance-CGA Stand pivot tranfser to L-max A Sit<>stand from w/c-min A Standing balance-mod A Attention to L-max verbal cues. Pt turns head past midline but eyes don't tract past midline Bathing-mod A LB dressing-max A UB dressing-mod A  Pt requires max verbal cues for sequencing and initiation.   Pt c/o increased H/A with standing (see below)  Pt less verbal today and required max encouragement to respond.  Pt remained in w/c with all needs within reach, belt alarm activated, and lap tray in place.  Therapy Documentation Precautions:  Precautions Precautions: Fall, Other (comment) Precaution Comments: crani helmet when OOB Required Braces or Orthoses: Other Brace Restrictions Weight Bearing Restrictions: No   Pain:  Pt c/o increased H/A when standing BP 109/73 HR 78; repositioned in TIS w/c   Therapy/Group:  Individual Therapy  Leroy Libman 09/27/2019, 10:33 AM

## 2019-09-27 NOTE — Patient Care Conference (Signed)
Inpatient RehabilitationTeam Conference and Plan of Care Update Date: 09/27/2019   Time: 10:45 AM   Patient Name: Matthew Black      Medical Record Number: [id number]  Date of Birth: 1976/11/19 Sex: Male         Room/Bed: 4W14C/4W14C-01 Payor Info: Payor: MEDICAID [place] / Plan: MEDICAID [place] ACCESS / Product Type: *No Product type* /    Admit Date/Time:  09/06/2019  3:37 PM  Primary Diagnosis:  Right middle cerebral artery stroke [facility])  Patient Active Problem List   Diagnosis Date Noted  . Wound infection after surgery   . Low grade fever   . Sleep disturbance   . Dysphagia, post-stroke   . Transaminitis   . Right middle cerebral artery stroke ([place]) 09/06/2019  . Cerebral abscess   . Urinary tract infection without hematuria   . Altered mental status   . Primary hypercoagulable state ([place])   . Acute pulmonary embolism without acute cor pulmonale (HCC)   . Deep vein thrombosis (DVT) of non-extremity vein   . Hypokalemia   . FUO (fever of unknown origin)   . Acute blood loss anemia   . SIRS (systemic inflammatory response syndrome) (HCC)   . Leukocytosis   . Endotracheal tube present   . Acute respiratory failure with hypoxemia ([place])   . Stroke (cerebrum) ([place]) 07/19/2019  . Pressure injury of skin 07/19/2019  . Acute CVA (cerebrovascular accident) ([place])   . Encephalopathy   . Dysphagia   . Acute encephalopathy   . Essential hypertension   . Annual physical exam 09/11/2016  . Obesity 03/12/2016  . PCP NOTES >>>>>>>>>>>>>>>>>. 03/12/2016  . Scalp abscess 12/20/2015  . Neck abscess 12/20/2015  . Pilonidal cyst 02/08/2013    Expected Discharge Date: Expected Discharge Date: 10/07/19  Team Members Present: Physician leading conference: Dr. Alger Simons Social Worker Present: Lennart Pall, LCSW Nurse Present: Rozetta Nunnery, RN Case manager: Karene Fry, RN PT Present: Lavone Nian, PT OT Present: Cherylynn Ridges, OT SLP Present: Weston Anna, SLP PPS Coordinator  present : Gunnar Fusi, Novella Olive, PT     Current Status/Progress Goal Weekly Team Focus  Bowel/Bladder   pt incontinent of B&B, has condom cath on, LBM 11/2  improve continence, prevent MASD damage  assess toileting q shift and prn   Swallow/Nutrition/ Hydration   Dys. 3 textures with thin liquids, Min A  Supervision  use of swallowing strategies, tolerance of upgrade   ADL's   Bathing-mod A with +2 for sit<>stand form LB; dressing-max A+2 for LB; stand pivot tranfsers-mod A to L and max A to R; max verbal cues for attentin to L but able to turn head past midline to locate items; sitting balanc EOB-CGA  Mod A overall  sitting balance, trunk control, funcitonal tranfsers, BADL retrainining, L attention, LUE NMR, activity tolerance   Mobility   mod/max assist bed mobility, max assist stand pivot transfers, mod/max pre gait at rail  mod assist overall, w/c level  bed mobility, transfers, pre-gait, postural control & midline, caregiver training & d/c planning, initiating w/c mobility   Communication   Mod A  Supervision-Min A  increased vocal intensity/initiation of verbal expression   Safety/Cognition/ Behavioral Observations  Mod-Max A  Min A  attention, problem solving, basic orientation/recall   Pain   c/o back and neck pain, has scheduled and prn (pt compliant w/ baclofen, refuses tylenol)  pain less than 5  assess pain q shift and prn   Skin   Abcess on right  side of head, purulent drainage, cleanse w/ betadine   continue skin care, prevent further breakdown  assess skin q shift and prn    Rehab Goals Patient on target to meet rehab goals: Yes *See Care Plan and progress notes for long and short-term goals.     Barriers to Discharge  Current Status/Progress Possible Resolutions Date Resolved   Nursing                  PT  Inaccessible home environment;Incontinence;Decreased caregiver support;Lack of/limited family support  pt needs ramp, unsure if wife can provide  necessary level of care upon d/c              OT                  SLP                SW                Discharge Planning/Teaching Needs:  Pt to return home with wife as primary caregiver with additional assistance from children and extended family.  Teaching is ongoing but plan for more formal training closer to d/c.   Team Discussion: Head bothering him, no signs of infection, more depressed, neck/back pain.  Vasovagal episode yesterday, okay today.  Needs encouraging.  A+O, depressed, cont bowel, condom cath at night, head hurts around scar tissue.  OT slow progress, mod bathing, max LB dressing.  PT mad bed, max transfers and stand pivot, needs ramp.  ? If wife can care for him at this level.  SLP D3thin, not eating much, severe L inattention, whispers, can phonate.  MD increased celexa and is on Ritalin, refused yesterday.  Parents and Inlaws to be involved, start family ed soon.   Revisions to Treatment Plan: N/A     Medical Summary Current Status: ongoing abx rx, drainage from scalp--non infectious, likely more depressed. on ritalin for activation/motivation Weekly Focus/Goal: wound care, motivation, pain control, mood  Barriers to Discharge: Behavior;Medical stability       Continued Need for Acute Rehabilitation Level of Care: The patient requires daily medical management by a physician with specialized training in physical medicine and rehabilitation for the following reasons: Direction of a multidisciplinary physical rehabilitation program to maximize functional independence : Yes Medical management of patient stability for increased activity during participation in an intensive rehabilitation regime.: Yes Analysis of laboratory values and/or radiology reports with any subsequent need for medication adjustment and/or medical intervention. : Yes   I attest that I was present, lead the team conference, and concur with the assessment and plan of the team.   Matthew Black 09/27/2019, 4:01 PM  Team conference was held via web/ teleconference due to [place] - 19

## 2019-09-28 ENCOUNTER — Inpatient Hospital Stay (HOSPITAL_COMMUNITY): Payer: Medicaid Other | Admitting: Physical Therapy

## 2019-09-28 ENCOUNTER — Inpatient Hospital Stay (HOSPITAL_COMMUNITY): Payer: Medicaid Other | Admitting: Speech Pathology

## 2019-09-28 ENCOUNTER — Inpatient Hospital Stay (HOSPITAL_COMMUNITY): Payer: Medicaid Other | Admitting: *Deleted

## 2019-09-28 LAB — BASIC METABOLIC PANEL
Anion gap: 8 (ref 5–15)
BUN: 10 mg/dL (ref 6–20)
CO2: 25 mmol/L (ref 22–32)
Calcium: 9.2 mg/dL (ref 8.9–10.3)
Chloride: 109 mmol/L (ref 98–111)
Creatinine, Ser: 0.59 mg/dL — ABNORMAL LOW (ref 0.61–1.24)
GFR calc Af Amer: >60 mL/min (ref >60)
GFR calc non Af Amer: >60 mL/min (ref >60)
Glucose, Bld: 98 mg/dL (ref 70–99)
Potassium: 3.7 mmol/L (ref 3.5–5.1)
Sodium: 142 mmol/L (ref 135–145)

## 2019-09-28 NOTE — Progress Notes (Signed)
Occupational Therapy Weekly Progress Note  Patient Details  Name: Matthew Black MRN: [id number] Date of Birth: February 11, 1976  Beginning of progress report period: September 20, 2019 End of progress report period: September 28, 2019  Patient has met 2 of 3 short term goals.  Pt continues to progress with bed mobility, sitting balance, functional transfers, standing balance, and self care tasks.  Pt incontinent of bowel and bladder and requires tot A for toleting tasks.  Pt requires max A for supine<>sit EOB and CGA for static sitting balance,  Stand pivot tranfsers to L with max A and to R wit min/mod A.  Pt completes sit<>stand from w/c with min A with proprioceptive input at L knee.  Pt requires max verbal cues to attend to his L but can turn his head past midline.  Pt's eyes do not track to L past midline. Pt requires mod A for bathing with sit<>stand from w/c at sink.  LB dressing with max A and UB dressing with mod A.  Pt requires max verbal cues for task initiation and sequencing.   Patient continues to demonstrate the following deficits: muscle weakness, decreased cardiorespiratoy endurance, impaired timing and sequencing, abnormal tone, unbalanced muscle activation, motor apraxia, ataxia, decreased coordination and decreased motor planning, decreased midline orientation, decreased attention to left, left side neglect and decreased motor planning, decreased initiation, decreased attention, decreased awareness, decreased problem solving, decreased safety awareness, decreased memory and delayed processing and decreased sitting balance, decreased standing balance, decreased postural control, hemiplegia and decreased balance strategies and therefore will continue to benefit from skilled OT intervention to enhance overall performance with BADL and Reduce care partner burden.  Patient progressing toward long term goals..  Continue plan of care.  OT Short Term Goals Week 3:  OT Short Term Goal 1 (Week 3): Pt  will complete sit<>stand at the sink with max A of 1 person OT Short Term Goal 1 - Progress (Week 3): Met OT Short Term Goal 2 (Week 3): Pt will complete 1 step of UB dressing task OT Short Term Goal 2 - Progress (Week 3): Met OT Short Term Goal 3 (Week 3): Pt demonstrate self-ROM of L UE with mod instructional cues OT Short Term Goal 3 - Progress (Week 3): Progressing toward goal Week 4:  OT Short Term Goal 1 (Week 4): STG=LTG secndary to ELOS    Leroy Libman 09/28/2019, 6:39 AM

## 2019-09-28 NOTE — Progress Notes (Signed)
Speech Language Pathology Weekly Progress and Session Note  Patient Details  Name: Matthew Black MRN: [id number] Date of Birth: [birth year]  Beginning of progress report period: September 21, 2019 End of progress report period: September 28, 2019  Today's Date: 09/28/2019  Session 1: SLP Individual Time: 1100-1155 SLP Individual Time Calculation (min): 55 min   Session 2: SLP Individual Time: 1300-1330 SLP Individual Time Calculation (min): 30  min  Short Term Goals: Week 3: SLP Short Term Goal 1 (Week 3): Pt will consume current diet with minimal overt s/s of aspiration/dysphagia and supervision level cues for use of compensatory swallow strategies. SLP Short Term Goal 1 - Progress (Week 3): Met SLP Short Term Goal 2 (Week 3): Patient will demonstrate efficient mastication with complete oral clearance with trials of Dys. 3 textures with Min verbal cues over 2 sessions prior to upgrade. SLP Short Term Goal 2 - Progress (Week 3): Met SLP Short Term Goal 3 (Week 3): Given Max A cues, pt will increase vocal intensity at the phrase level to achieve 75% speech intelligibility. SLP Short Term Goal 3 - Progress (Week 3): Not met SLP Short Term Goal 4 (Week 3): Patient will demonstrate sustained attention to tasks for ~10 minutes with Max verbal cues for redirection. SLP Short Term Goal 4 - Progress (Week 3): Met SLP Short Term Goal 5 (Week 3): Patient will scan to midline/left field of enviornment to locate functional items in 5 out of 10 opportunities with Mod A multimodal cues. SLP Short Term Goal 5 - Progress (Week 3): Not met SLP Short Term Goal 6 (Week 3): Patient will demonstrate basic problem solving with Max A multimodal cues during functional tasks. SLP Short Term Goal 6 - Progress (Week 3): Met    New Short Term Goals: Week 4: SLP Short Term Goal 1 (Week 4): STGs=LTGs due to ELOS  Weekly Progress Updates: Patient continues to make functional gains and has met 4 of 6 LTGs this reporting  period. Currently, patient is consuming Dys. 3 textures with thin liquids without overt s/s of aspiration and Min A verbal cues for use of swallowing compensatory strategies. Recommend trials of regular textures this week. Patient continues to be aphonic and requires Max A multimodal cues for utilization of an increased vocal intensity at the word and phrase level to maximize overall intelligibility. Patient also continues to demonstrate moderate-severe cognitive impairments and requires Max A multimodal cues for visual scanning to his left field of environment, attention and basic problem solving. Patient and family education ongoing. Patient would benefit from continued skilled SLP intervention to maximize his swallowing, cognitive and speech function prior to discharge home.      Intensity: Minumum of 1-2 x/day, 30 to 90 minutes Frequency: 3 to 5 out of 7 days Duration/Length of Stay: 10/07/19 Treatment/Interventions: Cognitive remediation/compensation;Dysphagia/aspiration precaution training;Internal/external aids;Speech/Language facilitation;Therapeutic Activities;Cueing hierarchy;Environmental controls;Functional tasks;Patient/family education   Daily Session  Skilled Therapeutic Interventions:   Session 1: Skilled treatment session focused on cognitive goals. SLP facilitated session by providing extra time and Mod A verbal cues for verbal reasoning and specificity during a verbal description task. Min A verbal cues were also needed for decoding at the word level. Patient scanned past midline to the left field of environment to locate individual cards with Mod verbal cues and with extra time while reading at the word level. Despite Max A multimodal cues, patient unable to maximize vocal intensity on command. Patient independently oriented to place and situation but required Mod verbal and  visual cues for use of calendar for orientation to time. Patient also required total A to recall events from  previous therapy sessions in regards to declining participation in PT session. Patient left upright in bed with alarm on and all needs within reach. Continue with current plan of care.   Session 2: Skilled treatment session focused on cognitive and dysphagia goals. Upon arrival, patient had pressed the call light to ask for a brief change 2/2 being incontinent of both bowel and bladder. Patient reported he called for assistance prior to incontinence but unable to confirm. Patient was transferred to the wheelchair via the [facility] due to soaking through the sheets. SLP administered trials of regular textures. Patient demonstrated efficient mastication with complete oral clearance without overt s/s of aspiration, therefore, recommend trial tray prior to upgrade. Patient left upright in wheelchair with NT present for full supervision with lunch meal. Continue with current plan of care.    Pain No/Denies Pain  Therapy/Group: Individual Therapy  Forever Arechiga 09/28/2019, 6:48 AM

## 2019-09-28 NOTE — Progress Notes (Signed)
Occupational Therapy Session Note  Patient Details  Name: Matthew Black MRN: [id number] Date of Birth: [birth year]  Today's Date: 09/28/2019 OT Individual Time: 1345-1430 OT Individual Time Calculation (min): 45 min    Short Term Goals: Week 4:  OT Short Term Goal 1 (Week 4): STG=LTG secndary to ELOS  Skilled Therapeutic Interventions/Progress Updates:    Pt resting in w/c upon arrival and agreeable to therapy.  OT intervention with focus on BADL retraining including bathing/dressing with sit<>stand from w/c at sink, attention to L, LUE NMR, standing balance, task initiation, sequencing, and activity tolerance to increase independence with BADLs. Pt requires min verbal cues for task initiation and sequencing during bathing tasks.  Pt recalls hemi dressing techniques but requires physical assistance to complete tasks.  Pt turning head to L and tracking B eyes to L past midline but unable to sustain longer than ~5 secs. Sit<>stand with min A and proprioceptive input through L knee. Standing balance with mod A and tactile cues for improved standing posture. Pt c/o pressure on R side of head.  RN notified.  Pt's wife present to observe. Pt remained in TIS w/c with belt alarm activated, half lap tray in place, and wife present.   Therapy Documentation Precautions:  Precautions Precautions: Fall, Other (comment) Precaution Comments: crani helmet when OOB Required Braces or Orthoses: Other Brace Restrictions Weight Bearing Restrictions: No   Pain:  Pt c/o increased "pressure" on R side of head; RN notified   Therapy/Group: Individual Therapy  Leroy Libman 09/28/2019, 2:47 PM

## 2019-09-28 NOTE — Progress Notes (Signed)
Physical Therapy Session Note  Patient Details  Name: AMERION THONG MRN: [id number] Date of Birth: 06-17-1976  Today's Date: 09/28/2019 PT Individual Time: 0811-0823 PT Individual Time Calculation (min): 12 min   Short Term Goals: Week 4:  PT Short Term Goal 1 (Week 4): STG = LTG due to estimated d/c date.  Skilled Therapeutic Interventions/Progress Updates:  Pt received in bed with breakfast tray just arriving & pt requesting to eat. Pt encouraged to transfer to sitting EOB or transfer to w/c to consume breakfast but pt unwilling despite encouragement, reporting "I just want to lay here and eat". Pt was able to scoot to [facility] with bed flat, bed rails, and max assist with cuing for technique. Pt positioned upright in bed & set up with meal tray. Pt wishing to eat breakfast so left in supervision of rehab tech, bed alarm set, call bell in reach.  No c/o or behaviors demonstrating pain during session.  Therapy Documentation Precautions:  Precautions Precautions: Fall, Other (comment) Precaution Comments: crani helmet when OOB Required Braces or Orthoses: Other Brace Restrictions Weight Bearing Restrictions: No General: PT Amount of Missed Time (min): 48 Minutes PT Missed Treatment Reason: Patient unwilling to participate(breakfast)    Therapy/Group: Individual Therapy  [name] 09/28/2019, 8:28 AM

## 2019-09-28 NOTE — Progress Notes (Signed)
[place] PHYSICAL MEDICINE & REHABILITATION PROGRESS NOTE   Subjective/Complaints: Patient did not speak today but nodded in response to my prompts and questions. Appears comfortable. BMP stable this morning.   ROS: Limited due to cognitive/behavioral    Objective:   No results found. Recent Labs    09/26/19 1057  WBC 6.0  HGB 12.3*  HCT 38.4*  PLT 245   Recent Labs    09/26/19 1057 09/28/19 0333  NA 140 142  K 3.0* 3.7  CL 111 109  CO2 21* 25  GLUCOSE 98 98  BUN 9 10  CREATININE 0.48* 0.59*  CALCIUM 7.3* 9.2    Intake/Output Summary (Last 24 hours) at 09/28/2019 1111 Last data filed at 09/28/2019 [id number] Gross per 24 hour  Intake 580 ml  Output 500 ml  Net 80 ml     Physical Exam: Vital Signs Blood pressure 118/82, pulse 79, temperature 98.8 F (37.1 C), temperature source Oral, resp. rate 20, height 5\' 11"  (1.803 m), weight 86.1 kg, SpO2 100 %. Constitutional: No distress . Vital signs reviewed. HEENT: EOMI, oral membranes moist Neck: supple Cardiovascular: RRR without murmur. No JVD    Respiratory: CTA Bilaterally without wheezes or rales. Normal effort    GI: BS +, non-tender, non-distended  Skin: area of hypergranulation/scar tissue on posterior aspect of crani incision which can be compressed. Minimal old drainage evident today.  Psych: very flat, won't engage much Musc: No edema in extremities.  No tenderness in extremities. Neuro: Alert, 0/5 LUE and LLE. Left inattention persists, tone 1+/4 LUE and LLE RUE/RLE appear to be 5/5 proximal distal   Assessment/Plan: 1. 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  Physiatrist is providing close team supervision and 24 hour management of active medical problems listed below.  Physiatrist and rehab team continue to assess barriers to discharge/monitor patient progress toward functional and  medical goals  Care Tool:  Bathing    Body parts bathed by patient: Left arm, Chest, Abdomen, Front perineal area, Right upper leg, Left upper leg   Body parts bathed by helper: Right arm, Buttocks, Right lower leg, Left lower leg     Bathing assist Assist Level: Moderate Assistance - Patient 50 - 74%     Upper Body Dressing/Undressing Upper body dressing   What is the patient wearing?: Pull over shirt    Upper body assist Assist Level: Maximal Assistance - Patient 25 - 49%    Lower Body Dressing/Undressing Lower body dressing      What is the patient wearing?: Pants, Incontinence brief     Lower body assist Assist for lower body dressing: Total Assistance - Patient < 25%     Toileting Toileting Toileting Activity did not occur Landscape architect and hygiene only): N/A (no void or bm)  Toileting assist Assist for toileting: Maximal Assistance - Patient 25 - 49%     Transfers Chair/bed transfer  Transfers assist     Chair/bed transfer assist level: Moderate Assistance - Patient 50 - 74%     Locomotion Ambulation   Ambulation assist   Ambulation activity did not occur: Safety/medical concerns          Walk 10 feet activity   Assist  Walk 10 feet activity did not occur: Safety/medical concerns        Walk 50 feet activity   Assist Walk 50 feet with 2 turns activity did not occur: Safety/medical concerns  Walk 150 feet activity   Assist Walk 150 feet activity did not occur: Safety/medical concerns         Walk 10 feet on uneven surface  activity   Assist Walk 10 feet on uneven surfaces activity did not occur: Safety/medical concerns         Wheelchair     Assist Will patient use wheelchair at discharge?: Yes Type of Wheelchair: Manual Wheelchair activity did not occur: Safety/medical concerns         Wheelchair 50 feet with 2 turns activity    Assist    Wheelchair 50 feet with 2 turns activity did  not occur: Safety/medical concerns       Wheelchair 150 feet activity     Assist  Wheelchair 150 feet activity did not occur: Safety/medical concerns       Blood pressure 118/82, pulse 79, temperature 98.8 F (37.1 C), temperature source Oral, resp. rate 20, height 5\' 11"  (1.803 m), weight 86.1 kg, SpO2 100 %. Medical Problem List and Plan: 1.Left-sided weakness/dysphagiasecondary to right MCA/ACA infarction withR ICA,R MCAocclusion with cerebral edema status post hemicraniectomy with abdominal flap implant [id number] complicated bycerebral abscess/empyema hemorrhagic conversion  Continue CIR PT, OT, SLP  PRAFO, WHO    -continue to wear helmet when up with therapies (can hold today d/t discomfort) 2. Antithrombotics: -DVT/anticoagulation:Right peroneal, left popliteal, left posterior tibial and left peroneal DVT. Status post IVC filter 07/21/2019 with plan for retrieval 8 to 12 weeks. Patient currently remains on subcutaneous heparin. No plan for long-term anticoagulation -antiplatelet therapy: N/A 3. Pain Management:Tramadol, tylenol as needed   -kpad for back and neck  -continue scheduled baclofen for spasticity, may help neck and back pain too  -may continue prn robaxin 4. Mood:Provide motional support -antipsychotic agents: N/A  -suspect emerging depression- trial of celexa 10mg  qhs initiated 5. Neuropsych: This patientis notcapable of making decisions on hisown behalf.  -ritalin initiated for arousal/initiation 6. Skin/Wound Care:Routine skin checks  -dress scalp prn 7. Fluids/Electrolytes/Nutrition:Routine in and outs.  Hypokalemia-3.2 10/29---replete  -continue supplement   -prealbumin 26.0 10/19 8. Post stroke dysphagia. upgraded to Dysphagia #2withthin liquids.   -needs cueing/assistance with meals 9. Seizure prophylaxis. Keppra 500 mg twice daily 10. Cerebral abscess/empyema/cerebritis.  Cultures no growth to date. Continue IV vancomycin and cefepime 08/20/2019 x 6 to 8 weeks per infectious disease.  Vanc dosing per pharmacy (Tr 16 on 10/30)  -scalp wound   culture negative. Repeat looks  negative also   -wbc's 7.9 10/27   -cxr unremarkable   -ua negative, ucx never sent   -NS has seen   -afebrile since 10/28   -suspect scalp wound is fibronecrotic tissue within scalp    -PICC replaced at new site 11. Pseudomonas UTI. Zosyn completed.  12. Hypertension with tachycardia. Lopressor 50 mg every 8 hours. 13. History of tobacco marijuana use. Urine drug screen positive marijuana. Provide counseling with family  12. Syncopal episode:  -likely vasovagal  -labs really unremarkable except for hypokalemia  -no evidence of szs  -appears at baseline today  LOS: 22 days A FACE TO FACE EVALUATION WAS PERFORMED  Matthew Black 09/28/2019, 11:11 AM

## 2019-09-29 ENCOUNTER — Inpatient Hospital Stay (HOSPITAL_COMMUNITY): Payer: Medicaid Other | Admitting: Occupational Therapy

## 2019-09-29 ENCOUNTER — Inpatient Hospital Stay (HOSPITAL_COMMUNITY): Payer: Medicaid Other | Admitting: Physical Therapy

## 2019-09-29 ENCOUNTER — Encounter (HOSPITAL_COMMUNITY): Payer: Medicaid Other | Admitting: Psychology

## 2019-09-29 ENCOUNTER — Inpatient Hospital Stay (HOSPITAL_COMMUNITY): Payer: Medicaid Other | Admitting: Speech Pathology

## 2019-09-29 DIAGNOSIS — F329 Major depressive disorder, single episode, unspecified: Secondary | ICD-10-CM

## 2019-09-29 LAB — VANCOMYCIN, TROUGH: Vancomycin Tr: 14 ug/mL — ABNORMAL LOW (ref 15–20)

## 2019-09-29 NOTE — Progress Notes (Signed)
Social Work Patient ID: Matthew Black, male   DOB: Nov 03, 1976, 43 y.o.   MRN: [id number]   Met with pt and wife this afternoon to review conference information.  Both aware that we continue to plan for d/c 11/13 at mod assist w/c level.  Discussed need to begin hands on training with all family members.  Wife will speak with pt's parents and let me know tomorrow which day they can begin next week.  Will alert tx once known.  Atavia Poppe, LCSW

## 2019-09-29 NOTE — Progress Notes (Signed)
Physical Therapy Session Note  Patient Details  Name: Matthew Black MRN: [id number] Date of Birth: 1976/04/04  Today's Date: 09/29/2019 PT Individual Time: 1430-1530 PT Individual Time Calculation (min): 60 min   Short Term Goals: Week 4:  PT Short Term Goal 1 (Week 4): STG = LTG due to estimated d/c date.  Skilled Therapeutic Interventions/Progress Updates:   Pt in supine and agreeable to therapy, no c/o pain. Supine>sit w/ min-mod assist w/ verbal and tactile cues for technique and increased time as pt wanting to perform task as much as possible w/o assistance. Donned pants total assist at EOB and total assist to pull over hips once in stance. Stand pivot to TIS w/ mod assist and total assist w/c transport to/from therapy gym. Blocked practice of stand pivot transfers x4 reps in both directions. Mod assist towards R side, max assist towards L side. Verbal, tactile, and visual cues for technique, UE placement, and weight shifting. Sit<>stands at rail w/ min assist and worked on lateral weight shifting w/ emphasis on L quad activation in stance. Tactile and verbal cues for L quad activation, however trace contraction felt. Attempted R forward and backwards stepping w/ max assist to facilitate L lateral weight shifting, however when initiating R step pt would weight shift to R. Ultimately most successful w/ maintaining L weight shifting when RUE on 2nd therapist's shoulder to minimize pulling on rail. Worked on LLE activation on kinetron w/ therapist providing resistance on other pedal. Trace L hamstring activation after multiple reps. Returned to room via w/c, ended session tilted in TIS and all needs in reach.   Therapy Documentation Precautions:  Precautions Precautions: Fall, Other (comment) Precaution Comments: crani helmet when OOB Required Braces or Orthoses: Other Brace Restrictions Weight Bearing Restrictions: No Vital Signs: Therapy Vitals Temp: 99.1 F (37.3 C) Temp Source:  Oral Pulse Rate: 83 Resp: 16 BP: 125/79 Patient Position (if appropriate): Sitting Oxygen Therapy SpO2: 100 % O2 Device: Room Air  Therapy/Group: Individual Therapy  Tex Conroy Clent Demark 09/29/2019, 6:48 PM

## 2019-09-29 NOTE — Consult Note (Signed)
Neuropsychological Consultation   Patient:   Matthew Black   DOB:   06/20/1976  MR Number:  [id number]  Location:  [place] [facility] [place] [id number] [place] [address] Dept: [place]: [phone]           Date of Service:   09/29/2019  Start Time:   9 AM End Time:   10 AM  Provider/Observer:  Ilean Skill, Psy.D.       Clinical Neuropsychologist       Billing Code/Service: [phone]  Chief Complaint:    Matthew Black is a 43 year old male with unremarkable past med history on no prescription medications.  Presented 07/19/2019 after being found down.  Noverbal and not moving his left side.  CT head showed large right hemisphere infarction with confluence cytotoxic edema in the right ACA and MCA territories.  Evidence of large vessel occlusion.  MRI/MRA showed right ICA occlusion.  Large right hemisphere infarction.  Patient underwent decompressive right hemicraniectomy with abdominal flap implant.  Hospital course further comlicated by DVT.  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  MRI noted to have herniation of brain into the craniectomy site with large irregular enhancing fluid collection.  Patient has now been admitted for CIR program.  He has been making some functional gains but concerns about development of depressive symptoms as he gains awareness of deficits.    Reason for Service:  Patient was referred for neuropsychological consultation due to coping and adjustment issues.  Below is the HPI for the current admission.  [id number] [name] is a 43 year old right-handed male with unremarkable past medical history on no prescription medications. He quit smoking 3 years ago. 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 Presented 07/19/2019 after being found down. He had dried  vomit around his mouth and was covered in urine. Nonverbal not moving his left side. CT of the head showed a large right hemisphere infarction with confluence cytotoxic edema in the right ACA and MCA territories. Evidence of large vessel occlusion. CT cervical spine negative. MRI/MRA showed right ICA occlusion. 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 Alcohol negative, urine drug screen positive marijuana, creatinine 1.43, urine culture no growth, COVID negative. Patient underwent decompressive right hemicraniectomy with abdominal flap implant 07/19/2019 per Dr. Kathyrn Sheriff. Echocardiogram with ejection fraction of 65%. Bubble study negative for PFO. EEG with severe diffuse encephalopathy no seizure noted. Hospital course further complicated by lower extremity Doppler showing a DVT right peroneal, left popliteal, left posterior tibial and left peroneal. He underwent IVC filter placement 07/21/2019 with plan for retrieval in 8 to 12 weeks. He was not a candidate for long-term anticoagulation due to craniotomy but patient is currently maintained on subcutaneous heparin 5000 units every 8 hours. Patient n.p.o. with Cortrakfeeding tube for nutritional support and diet has been advanced to dysphagia #2 nectar liquid and nasogastric tube feeds discontinued. Keppra for seizure prophylaxis. He remained intubated 07/20/2019 through 07/25/2019. 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 He underwent aspiration on 08/21/2019 per Dr. Kathyrn Sheriff and cultures were sent with no growth to date. He was not a candidate for  surgery due to increasing herniation. ID consulted placed on IV vancomycin and cefepime  08/20/2019 x6 to 8 weeks. Patient also developed Pseudomonas UTI completing a course of Zosyn. Palliative care consulted 08/22/2019 to establish goals of care. Therapy evaluations completed and patient was admitted for a comprehensive rehab program.  Current Status:  The patient was able to communicate but speech was very low with slowed information processing reduced verbal fluency.  He acknowledged depressive symptoms and cryed when talking about not being able to see kids and the degree of functional loss.  He did have awareness of general medical events that have occurred.  He was able to identify some of the recent improvements he has made.  Wife came in for end of session today and we reviewed current symptoms, improvements that have been seen and coping strategies for dealing with reactive depression.  Patient is taking Celexa and Ritalin and appears to be helping.    Behavioral Observation: [name]  presents as a 43 y.o.-year-old Right African American Male who appeared his stated age. his dress was Appropriate and he was Well Groomed and his manners were Appropriate to the situation.  his participation was indicative of Appropriate and Redirectable behaviors.  There were any physical disabilities noted.  he displayed an appropriate level of cooperation and motivation.     Interactions:    Active Appropriate and Redirectable  Attention:   abnormal and attention span appeared shorter than expected for age  Memory:   abnormal; remote memory intact, recent memory impaired  Visuo-spatial:  not examined  Speech (Volume):  low  Speech:    Slowed, slurred but able to expresses self.  Thought Process:  Coherent and Relevant  Though Content:  WNL; not suicidal and not homicidal  Orientation:   person, place and situation  Judgment:   Good  Planning:   Poor  Affect:    Depressed  Mood:    Dysphoric  Insight:   Fair  Intelligence:   normal  Medical History:   Past Medical  History:  Diagnosis Date  . 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 Diabetes Other        GF  . Healthy Mother   . Healthy Father      Impression/DX:  Matthew Black is a 43 year old male with unremarkable past med history on no prescription medications.  Presented 07/19/2019 after being found down.  Noverbal and not moving his left side.  CT head showed large right hemisphere infarction with confluence cytotoxic edema in the right ACA and MCA territories.  Evidence of large vessel occlusion.  MRI/MRA showed right ICA occlusion.  Large right hemisphere infarction.  Patient underwent decompressive right hemicraniectomy with abdominal flap implant.  Hospital course further comlicated by DVT.  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  MRI noted to have herniation of brain into the craniectomy site with large irregular enhancing fluid collection.  Patient has now been admitted for CIR program.  He has been making some functional gains but concerns about development of depressive symptoms as he gains awareness of deficits.    The patient was able to communicate but speech was very low with slowed information processing reduced verbal fluency.  He acknowledged depressive symptoms and cryed when talking about not being able to see kids and the degree of functional loss.  He did have awareness of general  medical events that have occurred.  He was able to identify some of the recent improvements he has made.  Wife came in for end of session today and we reviewed current symptoms, improvements that have been seen and coping strategies for dealing with reactive depression.  Patient is taking Celexa and Ritalin and appears to be helping.   Disposition/Plan:  Had an effective visit today and patient has asked that we meet again.  Will see patient first of next week if possible.     Diagnosis:    Right middle cerebral artery stroke [facility]) - Plan: Ambulatory referral to Neurology  Fever - Plan: DG Chest 2 View, DG Chest 2 View         Electronically Signed   _______________________ Ilean Skill, Psy.D.

## 2019-09-29 NOTE — Progress Notes (Signed)
[place] PHYSICAL MEDICINE & REHABILITATION PROGRESS NOTE   Subjective/Complaints: Pt in bed. Asleep when I arrived. Didn't offer much to me  ROS: Limited due to cognitive/behavioral     Objective:   No results found. No results for input(s): WBC, HGB, HCT, PLT in the last 72 hours. Recent Labs    09/28/19 0333  NA 142  K 3.7  CL 109  CO2 25  GLUCOSE 98  BUN 10  CREATININE 0.59*  CALCIUM 9.2    Intake/Output Summary (Last 24 hours) at 09/29/2019 1153 Last data filed at 09/29/2019 0745 Gross per 24 hour  Intake 1020 ml  Output 700 ml  Net 320 ml     Physical Exam: Vital Signs Blood pressure 121/80, pulse 79, temperature 98.6 F (37 C), temperature source Oral, resp. rate 18, height 5\' 11"  (1.803 m), weight 86.1 kg, SpO2 100 %. Constitutional: No distress . Vital signs reviewed. HEENT: EOMI, oral membranes moist, crani incision w/ scar Neck: supple Cardiovascular: RRR without murmur. No JVD    Respiratory: CTA Bilaterally without wheezes or rales. Normal effort    GI: BS +, non-tender, non-distended  Skin: area of hypergranulation/scar tissue on posterior aspect of crani incision which can be compressed. No changes today Psych: flat Musc: No edema in extremities.  No tenderness in extremities. Neuro: Alert, 0/5 LUE and LLE. Left inattention persists, tone 1+/4 LUE and LLE RUE/RLE appear to be 5/5 proximal distal   Assessment/Plan: 1. 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  Physiatrist is providing close team supervision and 24 hour management of active medical problems listed below.  Physiatrist and rehab team continue to assess barriers to discharge/monitor patient progress toward functional and medical goals  Care Tool:  Bathing    Body parts bathed by patient: Left arm, Chest, Abdomen, Front perineal area, Right upper leg, Left upper  leg   Body parts bathed by helper: Right arm, Buttocks, Right lower leg, Left lower leg     Bathing assist Assist Level: Moderate Assistance - Patient 50 - 74%     Upper Body Dressing/Undressing Upper body dressing   What is the patient wearing?: Pull over shirt    Upper body assist Assist Level: Maximal Assistance - Patient 25 - 49%    Lower Body Dressing/Undressing Lower body dressing      What is the patient wearing?: Pants, Incontinence brief     Lower body assist Assist for lower body dressing: Total Assistance - Patient < 25%     Toileting Toileting Toileting Activity did not occur Landscape architect and hygiene only): N/A (no void or bm)  Toileting assist Assist for toileting: Maximal Assistance - Patient 25 - 49%     Transfers Chair/bed transfer  Transfers assist     Chair/bed transfer assist level: Moderate Assistance - Patient 50 - 74%     Locomotion Ambulation   Ambulation assist   Ambulation activity did not occur: Safety/medical concerns          Walk 10 feet activity   Assist  Walk 10 feet activity did not occur: Safety/medical concerns        Walk 50 feet activity   Assist Walk 50 feet with 2 turns activity did not occur: Safety/medical concerns         Walk 150 feet activity   Assist Walk 150 feet activity did not occur: Safety/medical concerns  Walk 10 feet on uneven surface  activity   Assist Walk 10 feet on uneven surfaces activity did not occur: Safety/medical concerns         Wheelchair     Assist Will patient use wheelchair at discharge?: Yes Type of Wheelchair: Manual Wheelchair activity did not occur: Safety/medical concerns         Wheelchair 50 feet with 2 turns activity    Assist    Wheelchair 50 feet with 2 turns activity did not occur: Safety/medical concerns       Wheelchair 150 feet activity     Assist  Wheelchair 150 feet activity did not occur: Safety/medical  concerns       Blood pressure 121/80, pulse 79, temperature 98.6 F (37 C), temperature source Oral, resp. rate 18, height 5\' 11"  (1.803 m), weight 86.1 kg, SpO2 100 %. Medical Problem List and Plan: 1.Left-sided weakness/dysphagiasecondary to right MCA/ACA infarction withR ICA,R MCAocclusion with cerebral edema status post hemicraniectomy with abdominal flap implant [id number] complicated bycerebral abscess/empyema hemorrhagic conversion  Continue CIR PT, OT, SLP  PRAFO, WHO    -continue to wear helmet when up with therapies (can hold today d/t discomfort)     2. Antithrombotics: -DVT/anticoagulation:Right peroneal, left popliteal, left posterior tibial and left peroneal DVT. Status post IVC filter 07/21/2019 with plan for retrieval 8 to 12 weeks. Patient currently remains on subcutaneous heparin. No plan for long-term anticoagulation -antiplatelet therapy: N/A 3. Pain Management:Tramadol, tylenol as needed   -kpad for back and neck  -continue scheduled baclofen for spasticity, may help neck and back pain too  -may continue prn robaxin 4. Mood:Provide motional support -antipsychotic agents: N/A  -suspect emerging depression- celexa increased to 20mg    -appreciate neuropsych eval. Pt and wife found his input helpful. Work on coping strategies 5. Neuropsych: This patientis notcapable of making decisions on hisown behalf.  -ritalin initiated for arousal/initiation/depression 6. Skin/Wound Care:Routine skin checks  -dress scalp prn 7. Fluids/Electrolytes/Nutrition:Routine in and outs.  Hypokalemia-3.2 10/29---replete  -continue supplement   -prealbumin 26.0 10/19 8. Post stroke dysphagia. upgraded to Dysphagia #2withthin liquids.   -needs cueing/assistance with meals 9. Seizure prophylaxis. Keppra 500 mg twice daily 10. Cerebral abscess/empyema/cerebritis. Cultures no growth to date. Continue IV vancomycin and  cefepime 08/20/2019 x 6 to 8 weeks per infectious disease.  Vanc dosing per pharmacy (Tr 16 on 10/30)  -scalp wound   culture negative. Repeat looks  negative also   -wbc's 7.9 10/27   -cxr unremarkable   -ua negative, ucx never sent   -NS has seen   -afebrile since 10/28   -suspect scalp wound is fibronecrotic tissue within scalp    -PICC replaced at new site 11. Pseudomonas UTI. Zosyn completed.  12. Hypertension with tachycardia. Lopressor 50 mg every 8 hours.    13. History of tobacco marijuana use. Urine drug screen positive marijuana. Provide counseling with family  46. Syncopal episode: earlier in week  -likely vasovagal  -labs really unremarkable except for hypokalemia  -no further issues reported     LOS: 23 days A FACE TO FACE EVALUATION WAS PERFORMED  Meredith Staggers 09/29/2019, 11:53 AM

## 2019-09-29 NOTE — Progress Notes (Signed)
Occupational Therapy Session Note  Patient Details  Name: Matthew Black MRN: [id number] Date of Birth: 06-05-1976  Today's Date: 09/29/2019 OT Individual Time: 1000-1100 OT Individual Time Calculation (min): 60 min    Short Term Goals: Week 4:  OT Short Term Goal 1 (Week 4): STG=LTG secndary to ELOS  Skilled Therapeutic Interventions/Progress Updates:    Pt greeted semi-reclined in bed with spouse present. Pt reported need to go to the bathroom. Pt noted to already be incontinent of bladder and had soaked through brief to the bed. Had pt's spouse assist with all BADL tasks today. OT educated on how to cue pt for bed mobility and where to provide assistance for supine to sit. Pt able to sit EOB with supervision/CGA today. OT got pt a hemi walker and educated pt and spouse on use for sit<>stands within BADL tasks. OT placed gait belt as well and educated pt's spouse on how to stand pt with gait belt. Pt stood multiple times at EOB with hemi RW and min/mod A with spouse. OT assisted with clothing management and brief change. Pt then reported need to have a BM. Max A stand-pivot to weaker L side by OT. Pt was able to have BM with increased time. Had pt's spouse assist with sit<>stands at [facility], then pt was able to reach behind in standing to wash buttocks. Educated spouse on removing BSC bucket, and how to then wash bottom from cut-out of BSC. Pt's spouse then assisted with min A stand-pivot to stronger R side to TIS wc. Pt wanted to wash upper body, so pt left in TIS wc at the sink with spouse to finsihing washing up.   Therapy Documentation Precautions:  Precautions Precautions: Fall, Other (comment) Precaution Comments: crani helmet when OOB Required Braces or Orthoses: Other Brace Restrictions Weight Bearing Restrictions: No Pain:   denies pain  Therapy/Group: Individual Therapy  Valma Cava 09/29/2019, 2:30 PM

## 2019-09-29 NOTE — Progress Notes (Signed)
Speech Language Pathology Daily Session Note  Patient Details  Name: Matthew Black MRN: [id number] Date of Birth: Aug 03, 1976  Today's Date: 09/29/2019 SLP Individual Time: 1330-1430 SLP Individual Time Calculation (min): 60 min  Short Term Goals: Week 4: SLP Short Term Goal 1 (Week 4): STGs=LTGs due to ELOS  Skilled Therapeutic Interventions: Skilled treatment session focused on dysphagia and speech goals. SLP facilitated session by providing skilled observation with trials of regular textures (skittles per patient request). Patient demonstrated efficient mastication and complete oral clearance without overt s/s of aspiration, therefore, recommend patient upgrade to regular textures. Patient's wife present and verbalized understanding of recommendations and aspiration precautions. Patient utilized an increased vocal intensity 50% of the time this session resulting in ~77-85% intelligibility at the sentence level. However, extra time is needed for initiation of verbal responses with Mod-Max A verbal cues needed for recall of events from previous therapy sessions.  Patient left upright in bed with alarm on and all needs within reach. Continue with current plan of care.      Pain No/Denies Pain   Therapy/Group: Individual Therapy  Matthew Black, [place] 09/29/2019, 3:47 PM

## 2019-09-30 ENCOUNTER — Inpatient Hospital Stay (HOSPITAL_COMMUNITY): Payer: Medicaid Other | Admitting: Speech Pathology

## 2019-09-30 ENCOUNTER — Inpatient Hospital Stay (HOSPITAL_COMMUNITY): Payer: Medicaid Other | Admitting: Occupational Therapy

## 2019-09-30 ENCOUNTER — Inpatient Hospital Stay (HOSPITAL_COMMUNITY): Payer: Medicaid Other | Admitting: Physical Therapy

## 2019-09-30 NOTE — Progress Notes (Signed)
Pharmacy Antibiotic Note  Matthew Black is a 43 y.o. male transferred from acute to CIR on  09/06/2019.  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  Pharmacy has been consulted to continue dosing/monitoring cefepime and vancomycin for brain abscess/cerebritis.   Pt was started on vancomycin and cefepime on 08/20/19, with planned duration of 6-8 weeks per ID. On transfer to CIR, he was receiving cefepime 2 gm IV Q 8 hrs and vancomycin 1250 mg IV Q 8 hrs, with goal vancomycin trough level of 15-20 mg/L.   10/16 Vancomycin trough = 21 10/19 Vancomycin trough = 8 (no missed doses) - unsure reason for the dramatic drop in trough. Will conservatively increase. 10/23 Vancomycin trough = 49 (drawn correctly) 10/23 Vancomycin random level (drawn ~19.5 hrs after last 1500 mg IV dose and drawn ~14 hrs after level of 49 mg/L) = 12 mg/L; using these levels, vanc ke=0.1016; half-life=6.8 hrs  (Scr drawn at same time has decreased to 0.62, which is more in line with his baseline of ~0.60) 10/26 AM: Vancomycin trough of 11 was drawn 4 hours late, estimated true trough to be ~15 10/29 VT = 16 11/15 VT = 14, no change (drawn a little late)  11/1: Scr 0.64 (stable)   Plan: Cont Vancomycin 1500 mg IV q12h (goal vanc trough of 15-20 mg/L) Continue Cefepime 2g IV q8h Re-check levels as needed  Height: 5\' 11"  (180.3 cm) Weight: 189 lb 13.1 oz (86.1 kg) IBW/kg (Calculated) : 75.3  Temp (24hrs), Avg:98.2 F (36.8 C), Min:97.3 F (36.3 C), Max:99.1 F (37.3 C)  Recent Labs  Lab  09/23/19 1348 09/25/19 0333 09/26/19 1057 09/28/19 0333 09/29/19 1411  WBC  --   --  6.0  --   --   CREATININE  --  0.64 0.48* 0.59*  --   VANCOTROUGH 16  --   --   --  14*    Estimated Creatinine Clearance: 129.4 mL/min (A) (by C-G formula based on SCr of 0.59 mg/dL (L)).    Allergies  Allergen Reactions  . Hydrocodone Nausea Only    Antimicrobials this admission: 9/21 Zosyn >> 9/26 9/26 Cefepime >> (11/20) 9/26 Vancomycin >> (11/20)  Microbiology results: 9/27 BCx X 2: NG/final 9/17  UCx: Pseudomonas aeruginosa (suscept to Zosyn) 9/14  Abdominal fluid: Gram stain: rate WBCs, NOS; cx: NG/final 9/27 Epidural abscess: Gram stain: mod WBCs (PMNs), NOS; cx: NG/final 9/27 MRSA PCR: positive  Thank you Anette Guarneri, PharmD Please utilize Amion for appropriate phone number to reach the unit pharmacist ([place])   09/30/2019 7:58 AM

## 2019-09-30 NOTE — Progress Notes (Signed)
Physical Therapy Session Note  Patient Details  Name: Matthew Black MRN: [id number] Date of Birth: 03-12-1976  Today's Date: 09/30/2019 PT Individual Time: [id number] PT Individual Time Calculation (min): 69 min   Short Term Goals: Week 4:  PT Short Term Goal 1 (Week 4): STG = LTG due to estimated d/c date.  Skilled Therapeutic Interventions/Progress Updates:  Pt received in w/c & agreeable to tx, but denying donning helmet 2/2 discomfort when wearing it. Transported pt to gym via w/c dependent assist and set pt up for transfer to mat table when pt is observed to be incontinent of urine so assisted back to room. Pt transferred sit<>stand at sink with mod assist to allow therapist to doff soiled & don clean brief, pt was able to perform frontal peri hygiene while standing with mod/max assist for standing balance. When assisting pt with posterior peri hygiene pt observed to be having BM so pt assisted onto [facility] where pt was able to have continent BM with therapy staff providing total assist for peri hygiene afterwards. Therapist provides total assist for donning clean pants and pt returns to sitting in w/c. Pt requesting to exercise later but agreeable with MAX encouragement/education. In dayroom, pt transfers w/c>mat table on R with max assist, but requires max assist +2 to return to w/c on L as pt with little initiation in transferring to standing for stand pivot and little engagement with overall transfer. Pt transfers sit>supine with mod assist, supine>sit with max, and rolling L with mod with max multimodal cuing for overall technique & sequencing. While supine on mat table pt performs BLE bridging with adductor hold with therapist providing max assist for LLE neutral alignment and max cuing for glute/hip extensor activation with pt unable to clear buttocks entirely from floor despite multimodal cuing. Pt requires max encouragement for engagement in exercise. At end of session pt left in TIS w/c with  chair alarm donned, call bell in reach.  Pain: during session pt c/o buttocks & back pain with therapist offering to ask nurse for tylenol but pt declining, repositioning completed PRN.  Therapy Documentation Precautions:  Precautions Precautions: Fall, Other (comment) Precaution Comments: crani helmet when OOB Required Braces or Orthoses: Other Brace Restrictions Weight Bearing Restrictions: No      Therapy/Group: Individual Therapy  [name] 09/30/2019, 12:17 PM

## 2019-09-30 NOTE — Progress Notes (Signed)
Speech Language Pathology Daily Session Note  Patient Details  Name: JESTON STATHIS MRN: [id number] Date of Birth: [birth year]  Today's Date: 09/30/2019 SLP Individual Time: 0700-0730 SLP Individual Time Calculation (min): 30 min  Short Term Goals: Week 4: SLP Short Term Goal 1 (Week 4): STGs=LTGs due to ELOS  Skilled Therapeutic Interventions: Skilled treatment session focused on dysphagia and speech goals.  SLP facilitated session by initially providing extra time and Min A verbal cues for initiation of verbal expression during responses to questions. However, as session progressed, patient's initiation improved but Max A verbal cues were needed for use of an increased vocal intensity. Patient consumed breakfast meal of regular textures with thin liquids without overt s/s of aspiration but required supervision verbal cues to self-monitor and correct left anterior spillage. Recommend patient continue current diet. Patient left supine in bed with alarm on and all needs within reach. Continue with current plan of care.      Pain No/Denies Pain   Therapy/Group: Individual Therapy  Jahnessa Vanduyn 09/30/2019, 12:51 PM

## 2019-09-30 NOTE — Progress Notes (Signed)
Occupational Therapy Session Note  Patient Details  Name: Matthew Black MRN: [id number] Date of Birth: Jan 14, 1976  Today's Date: 09/30/2019 OT Individual Time: 4098-1191 OT Individual Time Calculation (min): 72 min    Short Term Goals: Week 4:  OT Short Term Goal 1 (Week 4): STG=LTG secndary to ELOS  Skilled Therapeutic Interventions/Progress Updates:    Pt greeted semi-reclined in bed and agreeable to OT treatment session. Pt came to sitting EOB with increased time and mod A. Sitting balance at EOB with 75% close supervision. Stand-pivot to wc on R side with mod A. Worked on bathing/dressing tasks at the sink with focus on L attention.  OT provided pt with L wash mit and provided hand over hand A to integtrate L UE for neuro re-ed. Sit<>stand at the sink with mod A, then pt able to wash buttocks with set-up while OT provided mod A for stanidng balance. Max A to thread B LEs into pants, then pt able to assist with pulling pants up with mod A for standing balance. Pt brought to therapy gym and worked on  B UE task of ball toss and chest press using dowel rod. HOH A to integrate L UE into all tasks. Slight shoulder/scap activation noted. Pt returned to room at end of session and left tilted in TIS wc with chair alarm on, call bell in reach and needs met.   Therapy Documentation Precautions:  Precautions Precautions: Fall, Other (comment) Precaution Comments: crani helmet when OOB Required Braces or Orthoses: Other Brace Restrictions Weight Bearing Restrictions: No Pain:   denies pain at this time  Therapy/Group: Individual Therapy  Valma Cava 09/30/2019, 9:48 AM

## 2019-09-30 NOTE — Progress Notes (Signed)
[place] PHYSICAL MEDICINE & REHABILITATION PROGRESS NOTE   Subjective/Complaints: Pt in bed. No new complaints although back still sore  ROS: Limited due to cognitive/behavioral   Objective:   No results found. No results for input(s): WBC, HGB, HCT, PLT in the last 72 hours. Recent Labs    09/28/19 0333  NA 142  K 3.7  CL 109  CO2 25  GLUCOSE 98  BUN 10  CREATININE 0.59*  CALCIUM 9.2    Intake/Output Summary (Last 24 hours) at 09/30/2019 1120 Last data filed at 09/30/2019 0800 Gross per 24 hour  Intake 830.12 ml  Output 1300 ml  Net -469.88 ml     Physical Exam: Vital Signs Blood pressure 113/78, pulse 84, temperature (!) 97.3 F (36.3 C), temperature source Axillary, resp. rate 16, height 5\' 11"  (1.803 m), weight 86.1 kg, SpO2 100 %. Constitutional: No distress . Vital signs reviewed. HEENT: EOMI, oral membranes moist Neck: supple, dysphonic speech Cardiovascular: RRR without murmur. No JVD    Respiratory: CTA Bilaterally without wheezes or rales. Normal effort    GI: BS +, non-tender, non-distended  Skin: area of hypergranulation/scar tissue on posterior aspect of crani incision, less fluid/fluctuance underneath today.  Psych: flat but made more eye contact, seemed to be more up beat today Musc: No edema in extremities.  No tenderness in extremities. Neuro: Alert, 0/5 LUE and LLE. Left inattention persists, tone 1+/4 LUE and LLE RUE/RLE appear to be 5/5 proximal distal---ongoing  Assessment/Plan: 1. 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  Physiatrist is providing close team supervision and 24 hour management of active medical problems listed below.  Physiatrist and rehab team continue to assess barriers to discharge/monitor patient progress toward functional and medical goals  Care Tool:  Bathing    Body parts bathed by patient: Left  arm, Chest, Abdomen, Front perineal area, Right upper leg, Left upper leg   Body parts bathed by helper: Right arm, Buttocks, Right lower leg, Left lower leg     Bathing assist Assist Level: Moderate Assistance - Patient 50 - 74%     Upper Body Dressing/Undressing Upper body dressing   What is the patient wearing?: Pull over shirt    Upper body assist Assist Level: Maximal Assistance - Patient 25 - 49%    Lower Body Dressing/Undressing Lower body dressing      What is the patient wearing?: Pants, Incontinence brief     Lower body assist Assist for lower body dressing: Total Assistance - Patient < 25%     Toileting Toileting Toileting Activity did not occur Landscape architect and hygiene only): N/A (no void or bm)  Toileting assist Assist for toileting: Maximal Assistance - Patient 25 - 49%     Transfers Chair/bed transfer  Transfers assist     Chair/bed transfer assist level: Moderate Assistance - Patient 50 - 74%     Locomotion Ambulation   Ambulation assist   Ambulation activity did not occur: Safety/medical concerns          Walk 10 feet activity   Assist  Walk 10 feet activity did not occur: Safety/medical concerns        Walk 50 feet activity   Assist Walk 50 feet with 2 turns activity did not occur: Safety/medical concerns         Walk 150 feet activity   Assist Walk 150 feet activity did not occur: Safety/medical concerns  Walk 10 feet on uneven surface  activity   Assist Walk 10 feet on uneven surfaces activity did not occur: Safety/medical concerns         Wheelchair     Assist Will patient use wheelchair at discharge?: Yes Type of Wheelchair: Manual Wheelchair activity did not occur: Safety/medical concerns         Wheelchair 50 feet with 2 turns activity    Assist    Wheelchair 50 feet with 2 turns activity did not occur: Safety/medical concerns       Wheelchair 150 feet activity      Assist  Wheelchair 150 feet activity did not occur: Safety/medical concerns       Blood pressure 113/78, pulse 84, temperature (!) 97.3 F (36.3 C), temperature source Axillary, resp. rate 16, height 5\' 11"  (1.803 m), weight 86.1 kg, SpO2 100 %. Medical Problem List and Plan: 1.Left-sided weakness/dysphagiasecondary to right MCA/ACA infarction withR ICA,R MCAocclusion with cerebral edema status post hemicraniectomy with abdominal flap implant 123XX123 complicated bycerebral abscess/empyema hemorrhagic conversion  Continue CIR PT, OT, SLP  PRAFO, WHO    -continue to wear helmet when up with therapies as he tolerates     2. Antithrombotics: -DVT/anticoagulation:Right peroneal, left popliteal, left posterior tibial and left peroneal DVT. Status post IVC filter 07/21/2019 with plan for retrieval 8 to 12 weeks. Patient currently remains on subcutaneous heparin. No plan for long-term anticoagulation -antiplatelet therapy: N/A 3. Pain Management:Tramadol, tylenol as needed   -kpad for back and neck  -continue scheduled baclofen for spasticity, may help neck and back pain too  - continue prn robaxin 4. Mood:Provide motional support -antipsychotic agents: N/A  -  emerging depression- celexa increased to 20mg    -appreciate neuropsych eval. Pt and wife found his input helpful. Team to continue to help him work on coping strategies 5. Neuropsych: This patientis notcapable of making decisions on hisown behalf.  -ritalin initiated for arousal/initiation/depression 6. Skin/Wound Care:Routine skin checks  -dress scalp prn 7. Fluids/Electrolytes/Nutrition:Routine in and outs.  Hypokalemia-3.7 11/4  -continue supplement   -prealbumin 26.0 10/19  -recheck labs monday 8. Post stroke dysphagia. upgraded to Dysphagia #2withthin liquids.   -needs cueing/assistance with meals 9. Seizure prophylaxis. Keppra 500 mg twice  daily 10. Cerebral abscess/empyema/cerebritis. Cultures no growth to date. Continue IV vancomycin and cefepime 08/20/2019 x 6 to 8 weeks per infectious disease.  Vanc dosing per pharmacy (Tr 14 on 11/5)  -scalp wound   culture negative. Repeat negative also   -wbc's 6.0 11/2---f/u Monday   -cxr unremarkable   -ua negative, ucx never sent   -NS has seen   -afebrile since 10/28   -suspect scalp wound is fibronecrotic tissue within scalp    -PICC replaced at new site 11. Pseudomonas UTI. Zosyn completed.  12. Hypertension with tachycardia. Lopressor 50 mg every 8 hours.    13. History of tobacco marijuana use. Urine drug screen positive marijuana. Provide counseling with family  67. Syncopal episode: earlier in week  -likely vasovagal  -no further issues reported     LOS: 24 days A FACE TO FACE EVALUATION WAS PERFORMED  Meredith Staggers 09/30/2019, 11:20 AM

## 2019-09-30 NOTE — Progress Notes (Signed)
This nurse believes that patient would benefit greatly from a hall pass. He seems down and has not been able to see his children due to restrictions. Could we grant patient a pass to go outside with family?

## 2019-10-01 LAB — BASIC METABOLIC PANEL
Anion gap: 8 (ref 5–15)
BUN: 8 mg/dL (ref 6–20)
CO2: 26 mmol/L (ref 22–32)
Calcium: 9.8 mg/dL (ref 8.9–10.3)
Chloride: 103 mmol/L (ref 98–111)
Creatinine, Ser: 0.78 mg/dL (ref 0.61–1.24)
GFR calc Af Amer: >60 mL/min (ref >60)
GFR calc non Af Amer: >60 mL/min (ref >60)
Glucose, Bld: 92 mg/dL (ref 70–99)
Potassium: 3.8 mmol/L (ref 3.5–5.1)
Sodium: 137 mmol/L (ref 135–145)

## 2019-10-01 NOTE — Progress Notes (Signed)
Pt requested condom cath to be removed at this time. Condom cath removed without difficultly. Pt states he is not in any pain. Will pass on to oncoming nurse that patient would like to keep condom cath off.

## 2019-10-01 NOTE — Plan of Care (Signed)
  Problem: RH BLADDER ELIMINATION Goal: RH STG MANAGE BLADDER WITH ASSISTANCE Description: STG Manage Bladder With mod Assistance Outcome: Not Progressing; incontinent   Problem: RH BOWEL ELIMINATION Goal: RH STG MANAGE BOWEL WITH ASSISTANCE Description: STG Manage Bowel with mod Assistance. Outcome: Not Progressing; incontinent

## 2019-10-01 NOTE — Progress Notes (Signed)
Condom cath was on patient upon assessment. Patient stated that he wants condom cath to be removed. Educated patient that he may feel a little discomfort when removing condom cath. Pt states "no, I do not want anymore pain". Pt refuses pain medication and schedule tylenol. Condom cath not removed at this time. Will continue to assess condom cath site.

## 2019-10-01 NOTE — Progress Notes (Signed)
[place] PHYSICAL MEDICINE & REHABILITATION PROGRESS NOTE   Subjective/Complaints:  No new issues noted per nursing, appetite is good, occasional headache but this is relieved with Tylenol  ROS: Limited due to cognitive/behavioral   Objective:   No results found. No results for input(s): WBC, HGB, HCT, PLT in the last 72 hours. Recent Labs    10/01/19 0343  NA 137  K 3.8  CL 103  CO2 26  GLUCOSE 92  BUN 8  CREATININE 0.78  CALCIUM 9.8    Intake/Output Summary (Last 24 hours) at 10/01/2019 1101 Last data filed at 10/01/2019 0900 Gross per 24 hour  Intake 2546.83 ml  Output 1276 ml  Net 1270.83 ml     Physical Exam: Vital Signs Blood pressure 117/82, pulse 83, temperature 98.3 F (36.8 C), temperature source Oral, resp. rate 18, height 5\' 11"  (1.803 m), weight 86.1 kg, SpO2 100 %. Constitutional: No distress . Vital signs reviewed. HEENT: EOMI, oral membranes moist Neck: supple, dysphonic speech Cardiovascular: RRR without murmur. No JVD    Respiratory: CTA Bilaterally without wheezes or rales. Normal effort    GI: BS +, non-tender, non-distended  Skin: area of hypergranulation/scar tissue on posterior aspect of crani incision, less fluid/fluctuance underneath today.  Psych: flat but made more eye contact, seemed to be more up beat today Musc: No edema in extremities.  No tenderness in extremities. Neuro: Alert, 0/5 LUE and LLE. Left inattention persists, tone 1+/4 LUE and LLE RUE/RLE appear to be 5/5 proximal distal---ongoing  Assessment/Plan: 1. 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  Physiatrist is providing close team supervision and 24 hour management of active medical problems listed below.  Physiatrist and rehab team continue to assess barriers to discharge/monitor patient progress toward functional and medical goals  Care  Tool:  Bathing    Body parts bathed by patient: Left arm, Chest, Abdomen, Front perineal area, Right upper leg, Left upper leg   Body parts bathed by helper: Right arm, Buttocks, Right lower leg, Left lower leg     Bathing assist Assist Level: Moderate Assistance - Patient 50 - 74%     Upper Body Dressing/Undressing Upper body dressing   What is the patient wearing?: Pull over shirt    Upper body assist Assist Level: Maximal Assistance - Patient 25 - 49%    Lower Body Dressing/Undressing Lower body dressing      What is the patient wearing?: Pants, Incontinence brief     Lower body assist Assist for lower body dressing: Total Assistance - Patient < 25%     Toileting Toileting Toileting Activity did not occur Landscape architect and hygiene only): N/A (no void or bm)  Toileting assist Assist for toileting: Maximal Assistance - Patient 25 - 49%     Transfers Chair/bed transfer  Transfers assist     Chair/bed transfer assist level: 2 Helpers     Locomotion Ambulation   Ambulation assist   Ambulation activity did not occur: Safety/medical concerns          Walk 10 feet activity   Assist  Walk 10 feet activity did not occur: Safety/medical concerns        Walk 50 feet activity   Assist Walk 50 feet with 2 turns activity did not occur: Safety/medical concerns         Walk 150 feet activity   Assist Walk 150 feet activity did not occur: Safety/medical  concerns         Walk 10 feet on uneven surface  activity   Assist Walk 10 feet on uneven surfaces activity did not occur: Safety/medical concerns         Wheelchair     Assist Will patient use wheelchair at discharge?: Yes Type of Wheelchair: Manual Wheelchair activity did not occur: Safety/medical concerns         Wheelchair 50 feet with 2 turns activity    Assist    Wheelchair 50 feet with 2 turns activity did not occur: Safety/medical concerns        Wheelchair 150 feet activity     Assist  Wheelchair 150 feet activity did not occur: Safety/medical concerns       Blood pressure 117/82, pulse 83, temperature 98.3 F (36.8 C), temperature source Oral, resp. rate 18, height 5\' 11"  (1.803 m), weight 86.1 kg, SpO2 100 %. Medical Problem List and Plan: 1.Left-sided weakness/dysphagiasecondary to right MCA/ACA infarction withR ICA,R MCAocclusion with cerebral edema status post hemicraniectomy with abdominal flap implant 123XX123 complicated bycerebral abscess/empyema hemorrhagic conversion  Continue CIR PT, OT, SLP  PRAFO, WHO    -continue to wear helmet when up with therapies as he tolerates     2. Antithrombotics: -DVT/anticoagulation:Right peroneal, left popliteal, left posterior tibial and left peroneal DVT. Status post IVC filter 07/21/2019 with plan for retrieval 8 to 12 weeks. Patient currently remains on subcutaneous heparin. No plan for long-term anticoagulation -antiplatelet therapy: N/A 3. Pain Management:Tramadol, tylenol as needed   -kpad for back and neck  -continue scheduled baclofen for spasticity, may help neck and back pain too  - continue prn robaxin 4. Mood:Provide motional support -antipsychotic agents: N/A  -  emerging depression- celexa increased to 20mg    -appreciate neuropsych eval. Pt and wife found his input helpful. Team to continue to help him work on coping strategies 5. Neuropsych: This patientis notcapable of making decisions on hisown behalf.  -ritalin initiated for arousal/initiation/depression 6. Skin/Wound Care:Routine skin checks  -dress scalp prn 7. Fluids/Electrolytes/Nutrition:Routine in and outs.  Hypokalemia-3.7 11/4  -continue supplement   -prealbumin 26.0 10/19  -recheck labs monday 8. Post stroke dysphagia. upgraded to Dysphagia #2withthin liquids.   -needs cueing/assistance with meals 9. Seizure prophylaxis.  Keppra 500 mg twice daily, no seizure activity noted 10. Cerebral abscess/empyema/cerebritis. Cultures no growth to date. Continue IV vancomycin and cefepime 08/20/2019 x 6 to 8 weeks per infectious disease.  Vanc dosing per pharmacy (Tr 14 on 11/5)  -scalp wound   culture negative. Repeat negative also   -wbc's 6.0 11/2---f/u Monday   -cxr unremarkable   -ua negative, ucx never sent   -NS has seen   -afebrile since 10/28   -suspect scalp wound is fibronecrotic tissue within scalp    -PICC replaced at new site 11. Pseudomonas UTI. Zosyn completed.  12. Hypertension with tachycardia. Lopressor 50 mg every 8 hours.    13. History of tobacco marijuana use. Urine drug screen positive marijuana. Provide counseling with family  44. Syncopal episode: earlier in week  -likely vasovagal  -no further issues reported     LOS: 25 days A FACE TO FACE EVALUATION WAS PERFORMED  Charlett Blake 10/01/2019, 11:01 AM

## 2019-10-01 NOTE — Progress Notes (Signed)
Per wife patient's dehisced wound on his head is "bigger it is about 3cm now" per patient it is tender to touch when painted with betadine earlier Wife wanting to speak with MD. RN told wife that I will relate message in the morning. Continued to monitor.

## 2019-10-02 ENCOUNTER — Inpatient Hospital Stay (HOSPITAL_COMMUNITY): Payer: Medicaid Other

## 2019-10-02 ENCOUNTER — Inpatient Hospital Stay (HOSPITAL_COMMUNITY): Payer: Medicaid Other | Admitting: Speech Pathology

## 2019-10-02 MED ORDER — ALTEPLASE 2 MG IJ SOLR: 2.0000 mg | Freq: Once | INTRAMUSCULAR | Status: DC

## 2019-10-02 NOTE — Progress Notes (Signed)
VAST consulted to flush and cap PICC. VAST unable to obtain blood return with flush despite cap change and other troubleshooting. Cath-Flo ordered. Unit nurse to place IV team consult once medication arrives from pharmacy.

## 2019-10-02 NOTE — Plan of Care (Signed)
  Problem: RH BLADDER ELIMINATION Goal: RH STG MANAGE BLADDER WITH ASSISTANCE Description: STG Manage Bladder With mod Assistance Outcome: Not Progressing; incontinence   Problem: RH SAFETY Goal: RH STG ADHERE TO SAFETY PRECAUTIONS W/ASSISTANCE/DEVICE Description: STG Adhere to Safety Precautions With mod Assistance and appropriate assistive Device. Outcome: Progressing; incontinence

## 2019-10-02 NOTE — Progress Notes (Signed)
[facility] PHYSICAL MEDICINE & REHABILITATION PROGRESS NOTE   Subjective/Complaints:  Discussed R Crani "dehiscence" with RN , called wife as well , pt c/o irritation when betadine applied  ROS: Limited due to cognitive/behavioral   Objective:   No results found. No results for input(s): WBC, HGB, HCT, PLT in the last 72 hours. Recent Labs    10/01/19 0343  NA 137  K 3.8  CL 103  CO2 26  GLUCOSE 92  BUN 8  CREATININE 0.78  CALCIUM 9.8    Intake/Output Summary (Last 24 hours) at 10/02/2019 0957 Last data filed at 10/02/2019 0825 Gross per 24 hour  Intake 476 ml  Output -  Net 476 ml     Physical Exam: Vital Signs Blood pressure 116/82, pulse 62, temperature 98.5 F (36.9 C), temperature source Oral, resp. rate 16, height 5\' 11"  (1.803 m), weight 86.1 kg, SpO2 100 %. Constitutional: No distress . Vital signs reviewed. HEENT: EOMI, oral membranes moist Neck: supple, dysphonic speech Cardiovascular: RRR without murmur. No JVD    Respiratory: CTA Bilaterally without wheezes or rales. Normal effort    GI: BS +, non-tender, non-distended  Skin: area of hypergranulation/scar tissue on posterior aspect of crani incision, no fluid/fluctuance   10/02/2019    09/06/2019  Psych: flat good eye contact  Musc: No edema in extremities.  No tenderness in extremities. Neuro: Alert, 0/5 LUE and LLE. Left inattention persists, tone 1+/4 LUE and LLE RUE/RLE appear to be 5/5 proximal distal---ongoing  Assessment/Plan: 1. 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  Physiatrist is providing close team supervision and 24 hour management of active medical problems listed below.  Physiatrist and rehab team continue to assess barriers to discharge/monitor patient progress toward functional and medical goals  Care Tool:  Bathing    Body parts bathed by patient: Left  arm, Chest, Abdomen, Front perineal area, Right upper leg, Left upper leg   Body parts bathed by helper: Right arm, Buttocks, Right lower leg, Left lower leg     Bathing assist Assist Level: Moderate Assistance - Patient 50 - 74%     Upper Body Dressing/Undressing Upper body dressing   What is the patient wearing?: Pull over shirt    Upper body assist Assist Level: Maximal Assistance - Patient 25 - 49%    Lower Body Dressing/Undressing Lower body dressing      What is the patient wearing?: Pants, Incontinence brief     Lower body assist Assist for lower body dressing: Total Assistance - Patient < 25%     Toileting Toileting Toileting Activity did not occur Landscape architect and hygiene only): N/A (no void or bm)  Toileting assist Assist for toileting: Maximal Assistance - Patient 25 - 49%     Transfers Chair/bed transfer  Transfers assist     Chair/bed transfer assist level: 2 Helpers     Locomotion Ambulation   Ambulation assist   Ambulation activity did not occur: Safety/medical concerns          Walk 10 feet activity   Assist  Walk 10 feet activity did not occur: Safety/medical concerns        Walk 50 feet activity   Assist Walk 50 feet with 2 turns activity did not occur: Safety/medical concerns         Walk 150 feet activity   Assist Walk 150 feet activity did not occur: Safety/medical concerns  Walk 10 feet on uneven surface  activity   Assist Walk 10 feet on uneven surfaces activity did not occur: Safety/medical concerns         Wheelchair     Assist Will patient use wheelchair at discharge?: Yes Type of Wheelchair: Manual Wheelchair activity did not occur: Safety/medical concerns         Wheelchair 50 feet with 2 turns activity    Assist    Wheelchair 50 feet with 2 turns activity did not occur: Safety/medical concerns       Wheelchair 150 feet activity     Assist  Wheelchair 150  feet activity did not occur: Safety/medical concerns       Blood pressure 116/82, pulse 62, temperature 98.5 F (36.9 C), temperature source Oral, resp. rate 16, height 5\' 11"  (1.803 m), weight 86.1 kg, SpO2 100 %. Medical Problem List and Plan: 1.Left-sided weakness/dysphagiasecondary to right MCA/ACA infarction withR ICA,R MCAocclusion with cerebral edema status post hemicraniectomy with abdominal flap implant 123XX123 complicated bycerebral abscess/empyema hemorrhagic conversion  Continue CIR PT, OT, SLP  PRAFO, WHO    -continue to wear helmet when up with therapies as he tolerates     2. Antithrombotics: -DVT/anticoagulation:Right peroneal, left popliteal, left posterior tibial and left peroneal DVT. Status post IVC filter 07/21/2019 with plan for retrieval 8 to 12 weeks. Patient currently remains on subcutaneous heparin. No plan for long-term anticoagulation -antiplatelet therapy: N/A 3. Pain Management:Tramadol, tylenol as needed   -kpad for back and neck  -continue scheduled baclofen for spasticity, may help neck and back pain too  - continue prn robaxin 4. Mood:Provide motional support -antipsychotic agents: N/A  -  emerging depression- celexa increased to 20mg    -appreciate neuropsych eval. Pt and wife found his input helpful. Team to continue to help him work on coping strategies 5. Neuropsych: This patientis notcapable of making decisions on hisown behalf.  -ritalin initiated for arousal/initiation/depression 6. Skin/Wound Care:Routine skin checks  -dress scalp prn, granulation tissue looks less prominent than on 10/13, betadine may be irritating the tissue will have nursing wipe the betadine off with NS after application 7. Fluids/Electrolytes/Nutrition:Routine in and outs.  Hypokalemia-3.7 11/4  -continue supplement   -prealbumin 26.0 10/19  -recheck labs monday 8. Post stroke dysphagia. upgraded to Dysphagia  #2withthin liquids.   -needs cueing/assistance with meals 9. Seizure prophylaxis. Keppra 500 mg twice daily, no seizure activity noted 10. Cerebral abscess/empyema/cerebritis. Cultures no growth to date. Continue IV vancomycin and cefepime 08/20/2019 x 6 to 8 weeks per infectious disease.  Vanc dosing per pharmacy (Tr 14 on 11/5)  -scalp wound   culture negative. Repeat negative also   -wbc's 6.0 11/2---f/u Monday   -cxr unremarkable   -ua negative, ucx never sent   -NS has seen   -afebrile since 10/28   -suspect scalp wound is fibronecrotic tissue within scalp    -PICC replaced at new site 11. Pseudomonas UTI. Zosyn completed.  12. Hypertension with tachycardia. Lopressor 50 mg every 8 hours.    13. History of tobacco marijuana use. Urine drug screen positive marijuana. Provide counseling with family  34. Syncopal episode: earlier in week  -likely vasovagal  -no further issues reported     LOS: 26 days A FACE TO FACE EVALUATION WAS PERFORMED  Charlett Blake 10/02/2019, 9:57 AM

## 2019-10-02 NOTE — Progress Notes (Signed)
Speech Language Pathology Daily Session Note  Patient Details  Name: Matthew Black MRN: [id number] Date of Birth: 1976/11/10  Today's Date: 10/02/2019 SLP Individual Time: 1345-1414 SLP Individual Time Calculation (min): 29 min  Short Term Goals: Week 4: SLP Short Term Goal 1 (Week 4): STGs=LTGs due to ELOS  Skilled Therapeutic Interventions: Pt was seen for skilled ST targeting dysphagia and communication goals. SLP facilitated session with skilled observation of pt consuming regular textured candies (starburst and skittles). Pt used swallow strategies to achieve complete oral clearance Mod I. Immediate cough X1 observed when pt initiated verbal response while still forming bolus. SLP further facilitated session with Mod-Max A verbal cues to increase vocal intensity as well as extra time for responses to personally relevant questions. He easily engaged in conversation about his work, family, and interests with overall Min A for initiation of verbal responses. He also demonstrated appropriate turn taking during conversation. Pt was left in bed with alarm set and all needs within reach. Continue per current plan of care.      Pain Pain Assessment Pain Scale: 0-10 Pain Score: 8  Pain Type: Acute pain Pain Location: Other (Comment)(generalized "all over") Pain Descriptors / Indicators: Discomfort Pain Intervention(s): Other (Comment);Emotional support;Distraction(pt refused SLP's offer to request medications from RN)  Therapy/Group: Individual Therapy  Matthew Black 10/02/2019, 3:14 PM

## 2019-10-02 NOTE — Progress Notes (Signed)
Occupational Therapy Session Note  Patient Details  Name: Matthew Black MRN: [id number] Date of Birth: 04/22/1976  Today's Date: 10/02/2019 OT Individual Time: 1015-1100 OT Individual Time Calculation (min): 45 min    Short Term Goals: Week 4:  OT Short Term Goal 1 (Week 4): STG=LTG secndary to ELOS   Skilled Therapeutic Interventions/Progress Updates:    Entered room and observed fascia protruding from scalp incision. Verified this is known with MD and RN. Pt very slow to initiate this session and barely audible when speaking. Pt agreeable to therapy session. Pt required increased time and mod A to facilitate LLE movement and for lifting trunk. Pt c/o pain in his LUE/LLE with tactile input this session, rest breaks provided throughout session. Pt sat EOB with frequent LOB posteriorly, requiring mod A and mod-max cueing for maintain static sitting balance. Pt washed UB with mod A and donned new gown with mod A as well. Pt was adamant that his brief was dry and nursing had changed him recently. Pt was transferred back to EOB with +2 assistance. Brief checked and it was indeed saturated with urine. Brief changed total A. Pt positioned in bed with max +2 assist. Pt left supine with all needs met. Bed alarm set, pt assisted in finding preferred TV channel.   Therapy Documentation Precautions:  Precautions Precautions: Fall, Other (comment) Precaution Comments: crani helmet when OOB Required Braces or Orthoses: Other Brace Restrictions Weight Bearing Restrictions: No   Therapy/Group: Individual Therapy  Curtis Sites 10/02/2019, 7:24 AM

## 2019-10-02 NOTE — Progress Notes (Signed)
Spoke with Marjorie Smolder, RN; advised to give Vancomycin dose and to place IVT consult for Cath-flo once the normal saline flush for the Vanc is started.

## 2019-10-03 ENCOUNTER — Inpatient Hospital Stay (HOSPITAL_COMMUNITY): Payer: Medicaid Other | Admitting: Occupational Therapy

## 2019-10-03 ENCOUNTER — Inpatient Hospital Stay (HOSPITAL_COMMUNITY): Payer: Medicaid Other | Admitting: Physical Therapy

## 2019-10-03 ENCOUNTER — Encounter (HOSPITAL_COMMUNITY): Payer: Medicaid Other | Admitting: Psychology

## 2019-10-03 ENCOUNTER — Inpatient Hospital Stay (HOSPITAL_COMMUNITY): Payer: Medicaid Other | Admitting: Speech Pathology

## 2019-10-03 ENCOUNTER — Inpatient Hospital Stay (HOSPITAL_COMMUNITY): Payer: Medicaid Other

## 2019-10-03 LAB — BASIC METABOLIC PANEL
Anion gap: 10 (ref 5–15)
BUN: 8 mg/dL (ref 6–20)
CO2: 27 mmol/L (ref 22–32)
Calcium: 9.5 mg/dL (ref 8.9–10.3)
Chloride: 101 mmol/L (ref 98–111)
Creatinine, Ser: 0.76 mg/dL (ref 0.61–1.24)
GFR calc Af Amer: >60 mL/min (ref >60)
GFR calc non Af Amer: >60 mL/min (ref >60)
Glucose, Bld: 93 mg/dL (ref 70–99)
Potassium: 3.8 mmol/L (ref 3.5–5.1)
Sodium: 138 mmol/L (ref 135–145)

## 2019-10-03 LAB — CBC
HCT: 37.2 % — ABNORMAL LOW (ref 39.0–52.0)
Hemoglobin: 11.8 g/dL — ABNORMAL LOW (ref 13.0–17.0)
MCH: 27.3 pg (ref 26.0–34.0)
MCHC: 31.7 g/dL (ref 30.0–36.0)
MCV: 86.1 fL (ref 80.0–100.0)
Platelets: 255 10*3/uL (ref 150–400)
RBC: 4.32 MIL/uL (ref 4.22–5.81)
RDW: 14.3 % (ref 11.5–15.5)
WBC: 5.7 10*3/uL (ref 4.0–10.5)
nRBC: 0 % (ref 0.0–0.2)

## 2019-10-03 LAB — GLUCOSE, CAPILLARY: Glucose-Capillary: 96 mg/dL (ref 70–99)

## 2019-10-03 NOTE — Progress Notes (Signed)
[facility] PHYSICAL MEDICINE & REHABILITATION PROGRESS NOTE   Subjective/Complaints:  Pt up in bed. Phonated "hello" to me. "i've been working hard." told me his back didn't bother him as much today  ROS: Limited due to cognitive/behavioral   Objective:   No results found. Recent Labs    10/03/19 0449  WBC 5.7  HGB 11.8*  HCT 37.2*  PLT 255   Recent Labs    10/01/19 0343 10/03/19 0449  NA 137 138  K 3.8 3.8  CL 103 101  CO2 26 27  GLUCOSE 92 93  BUN 8 8  CREATININE 0.78 0.76  CALCIUM 9.8 9.5    Intake/Output Summary (Last 24 hours) at 10/03/2019 1023 Last data filed at 10/02/2019 2206 Gross per 24 hour  Intake 465 ml  Output 275 ml  Net 190 ml     Physical Exam: Vital Signs Blood pressure 109/82, pulse 68, temperature 98.9 F (37.2 C), temperature source Oral, resp. rate 18, height 5\' 11"  (1.803 m), weight 86.1 kg, SpO2 100 %. Constitutional: No distress . Vital signs reviewed. HEENT: EOMI, oral membranes moist Neck: supple Cardiovascular: RRR without murmur. No JVD    Respiratory: CTA Bilaterally without wheezes or rales. Normal effort    GI: BS +, non-tender, non-distended  Skin: area of hypergranulation/scar tissue on posterior aspect of crani incision, no fluid/fluctuance. Area is stable to reduced in size.  Psych: more engaging.   Musc: No edema in extremities.  No tenderness in extremities. Neuro: Alert, initiated more. Made good eye contact. 0/5 LUE and LLE. Left inattention persists, tone 1+/4 LUE and LLE--stable exam RUE/RLE appear to be 5/5 proximal distal---stable  Assessment/Plan: 1. 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  Physiatrist is providing close team supervision and 24 hour management of active medical problems listed below.  Physiatrist and rehab team continue to assess barriers to discharge/monitor patient progress  toward functional and medical goals  Care Tool:  Bathing    Body parts bathed by patient: Left arm, Chest, Abdomen, Front perineal area, Right upper leg, Left upper leg   Body parts bathed by helper: Right arm, Buttocks, Right lower leg, Left lower leg     Bathing assist Assist Level: Moderate Assistance - Patient 50 - 74%     Upper Body Dressing/Undressing Upper body dressing   What is the patient wearing?: Pull over shirt    Upper body assist Assist Level: Maximal Assistance - Patient 25 - 49%    Lower Body Dressing/Undressing Lower body dressing      What is the patient wearing?: Pants, Incontinence brief     Lower body assist Assist for lower body dressing: Total Assistance - Patient < 25%     Toileting Toileting Toileting Activity did not occur Landscape architect and hygiene only): N/A (no void or bm)  Toileting assist Assist for toileting: Maximal Assistance - Patient 25 - 49%     Transfers Chair/bed transfer  Transfers assist     Chair/bed transfer assist level: 2 Helpers     Locomotion Ambulation   Ambulation assist   Ambulation activity did not occur: Safety/medical concerns          Walk 10 feet activity   Assist  Walk 10 feet activity did not occur: Safety/medical concerns        Walk 50 feet activity   Assist Walk 50 feet with 2 turns activity did not occur: Safety/medical  concerns         Walk 150 feet activity   Assist Walk 150 feet activity did not occur: Safety/medical concerns         Walk 10 feet on uneven surface  activity   Assist Walk 10 feet on uneven surfaces activity did not occur: Safety/medical concerns         Wheelchair     Assist Will patient use wheelchair at discharge?: Yes Type of Wheelchair: Manual Wheelchair activity did not occur: Safety/medical concerns         Wheelchair 50 feet with 2 turns activity    Assist    Wheelchair 50 feet with 2 turns activity did not  occur: Safety/medical concerns       Wheelchair 150 feet activity     Assist  Wheelchair 150 feet activity did not occur: Safety/medical concerns       Blood pressure 109/82, pulse 68, temperature 98.9 F (37.2 C), temperature source Oral, resp. rate 18, height 5\' 11"  (1.803 m), weight 86.1 kg, SpO2 100 %. Medical Problem List and Plan: 1.Left-sided weakness/dysphagiasecondary to right MCA/ACA infarction withR ICA,R MCAocclusion with cerebral edema status post hemicraniectomy with abdominal flap implant [id number] complicated bycerebral abscess/empyema hemorrhagic conversion  Continue CIR PT, OT, SLP  PRAFO, WHO    -continue to wear helmet when up with therapies as he tolerates     2. Antithrombotics: -DVT/anticoagulation:Right peroneal, left popliteal, left posterior tibial and left peroneal DVT. Status post IVC filter 07/21/2019 with plan for retrieval 8 to 12 weeks. Patient currently remains on subcutaneous heparin. No plan for long-term anticoagulation -antiplatelet therapy: N/A 3. Pain Management:Tramadol, tylenol as needed   -kpad for back and neck  -continue scheduled baclofen for spasticity, may help neck and back pain too  - continue prn robaxin 4. Mood:Provide motional support -antipsychotic agents: N/A  - reactive depression- celexa increased to 20mg    -appreciate neuropsych eval. Pt and wife found his input helpful. Team to continue to help him work on coping strategies   -pt engaged more spontaneously then I've seen so far while here 5. Neuropsych: This patientis notcapable of making decisions on hisown behalf.  -ritalin initiated for arousal/initiation/depression 6. Skin/Wound Care:Routine skin checks  -continue to observe right scalp wound/granulation. Appears to be gradually improving. Keep clean. Doesn't need betadine. Avoid irritation as possible 7. Fluids/Electrolytes/Nutrition:Routine in and outs.   Hypokalemia-3.7 11/4--->3.8 11/9  -continue supplementing   -prealbumin 26.0 10/19  -I personally reviewed the patient's labs today.   8. Post stroke dysphagia. upgraded to Dysphagia #2withthin liquids.   -needs cueing/assistance with meals 9. Seizure prophylaxis. Keppra 500 mg twice daily, no seizure activity noted 10. Cerebral abscess/empyema/cerebritis. Cultures no growth to date. Continue IV vancomycin and cefepime 08/20/2019 x 6 to 8 weeks per infectious disease.  Vanc dosing per pharmacy (Tr 14 on 11/5)  -scalp wound   culture negative. Repeat negative also   -wbc's 6.0 11/2---> 5.7 11/9   -cxr unremarkable   -ua negative    -NS has seen/aware   -afebrile since 10/28   -suspect scalp wound is fibronecrotic tissue within scalp    -PICC replaced at new site 11. Pseudomonas UTI. Zosyn completed.  12. Hypertension with tachycardia. Lopressor 50 mg every 8 hours.   -controlled 13. History of tobacco marijuana use. Urine drug screen positive marijuana. Provide counseling with family  34. Syncopal episode last week  -likely vasovagal  -no further issues reported     LOS: 27 days A FACE TO FACE  EVALUATION WAS PERFORMED  Meredith Staggers 10/03/2019, 10:23 AM

## 2019-10-03 NOTE — Progress Notes (Signed)
I am going to order repeat imaging with a CT scan of his head to verify most of the abscess is resolved.   Otherwise, if ok, he can stop his antibiotics at the end of this week at discharge.  Thayer Headings, MD

## 2019-10-03 NOTE — Progress Notes (Signed)
Occupational Therapy Session Note  Patient Details  Name: Matthew Black MRN: [id number] Date of Birth: Sep 05, 1976  Today's Date: 10/03/2019 OT Individual Time: [id number] OT Individual Time Calculation (min): 70 min    Short Term Goals: Week 4:  OT Short Term Goal 1 (Week 4): STG=LTG secndary to ELOS  Skilled Therapeutic Interventions/Progress Updates:    Treatment session with focus on dynamic sitting balance, bed mobility, and LUE functional use during self-care retraining.  Pt received supine in bed reporting headache - RN notified.  Engaged in bed mobility with Mod assist with increased time to allow for initiation.  Completed UB bathing seated EOB with min assist for sitting balance due to Lt lean.  Pt able to maintain static sitting balance intermittently, still maintaining Lt lean.  Provided hand over hand assist to incorporate use of LUE when washing Rt arm and underarm and when applying deodorant.  Pt declined any OOB activity this session due to headache.  Pt returned to supine with mod assist and utilized bed functions and pushing through RLE to boost up in bed with mod assist.  Donned pants at bed level with bridging and rolling total assist.  Engaged in PROM and stretching to LUE to include digits and returned LUE to hand splint while in bed.  Pt requiring increased time for initiation throughout all self-care tasks and mobility this session.  Therapy Documentation Precautions:  Precautions Precautions: Fall, Other (comment) Precaution Comments: crani helmet when OOB Required Braces or Orthoses: Other Brace Restrictions Weight Bearing Restrictions: No Pain:  Pt with c/o headache. RN notified   Therapy/Group: Individual Therapy  Simonne Come 10/03/2019, 12:08 PM

## 2019-10-03 NOTE — Progress Notes (Signed)
Speech Language Pathology Daily Session Note  Patient Details  Name: Matthew Black MRN: [id number] Date of Birth: 1976/09/17  Today's Date: 10/03/2019 SLP Individual Time: 1300-1355 SLP Individual Time Calculation (min): 55 min  Short Term Goals: Week 4: SLP Short Term Goal 1 (Week 4): STGs=LTGs due to ELOS  Skilled Therapeutic Interventions: Skilled treatment session focused on dysphagia and speech goals as well as family education. SLP facilitated session by providing skilled observation with lunch meal of regular textures with thin liquids. Patient consumed meal without overt s/s of aspiration but required Mod verbal cues for selective attention to self-feeding throughout the meal. Patient requires extra time to initiate verbal responses but utilized an increased vocal intensity with overall Min A verbal cues which maximized his intelligibility to 90% at the sentence level. Patient's wife present and educated in regards to strategies to maximize safety and overall cognitive functioning at home as well as a variety of activities the patient can participate in at home to stay "involved" and cognitively active. She verbalized understanding and took notes. Patient left upright in bed with wife present. Continue with current plan of care.      Pain No/Denies Pain    Therapy/Group: Individual Therapy  [name] 10/03/2019, 3:14 PM

## 2019-10-03 NOTE — Progress Notes (Signed)
Physical Therapy Session Note  Patient Details  Name: Matthew Black MRN: [id number] Date of Birth: 08/13/1976  Today's Date: 10/03/2019 PT Individual Time: 1430-1530 PT Individual Time Calculation (min): 60 min   Short Term Goals: Week 4:  PT Short Term Goal 1 (Week 4): STG = LTG due to estimated d/c date.  Skilled Therapeutic Interventions/Progress Updates:  Pt received in bed with wife present for session. Pt reports he does not have to wear his helmet with therapist educating pt & wife on continued recommendations for pt to wear helmet as much as possible to protect head, but MD is aware & okay with pt not wearing it if he cannot tolerate it 2/2 pain. Deatra Ina, [employer], present for custom w/c eval. Pt transfers supine>sitting EOB with mod assist overall & max cuing to utilize bed rails & tolerates sitting EOB with close supervision/CGA ~15 minutes to allow Corene Cornea to take measurements. Educated pt & wife on recommendation of TIS w/c with head support and roho cushion for optimal positioning & pressure relief when OOB. Discussed use of transport w/c for appointments only & pt's wife reports she has access to a transport w/c & manual w/c at home. Corene Cornea then left and remainder of session focused on caregiver training with pt's wife electing to use gait belt & assisting pt with transfer bed>w/c via squat pivot with multiple scoots & blocking pt's L knee with min cuing from therapist throughout transfer. Pt observed to be incontinent of urine so transported to sink where pt transferred sit>stand with mod assist but required up to max assist for standing balance as pt unable to fully weight bear through LLE & extend knee with L heel coming up off of floor. Pt requires total assist for donning clean brief & pants but is able to perform anterior peri hygiene with max assist for balance. Once back in w/c, therapist introduced slide board to pt's wife & discussed positioning & use of board for  transfers. Pt's wife assisted pt with slide board transfer to bed and sit>supine with mod cuing from therapist & encouragement from therapist to have pt utilize R side of body as much as possible. Educated pt on recovery following medical event, and need to participate in as much hands on training prior to pt's d/c as possible, with plans to practice real car transfer later this week. After pt returned to bed pt noted to have incontinent BM & NT made aware with pt's wife encouraged to participate in cleaning pt as she will be primary caregiver once pt is at home. Pt left in bed with alarm set, wife present to supervise, & awaiting NT arrival.  Therapy Documentation Precautions:  Precautions Precautions: Fall, Other (comment) Precaution Comments: crani helmet when OOB Required Braces or Orthoses: Other Brace Restrictions Weight Bearing Restrictions: No  Pain: Pt reports inability to wear helmet 2/2 pain at site on head, so helmet not utilized during session.  Therapy/Group: Individual Therapy  [name] 10/03/2019, 3:41 PM

## 2019-10-03 NOTE — Plan of Care (Signed)
  Problem: RH SAFETY Goal: RH STG ADHERE TO SAFETY PRECAUTIONS W/ASSISTANCE/DEVICE Description: STG Adhere to Safety Precautions With mod Assistance and appropriate assistive Device. Outcome: Progressing   Problem: RH PAIN MANAGEMENT Goal: RH STG PAIN MANAGED AT OR BELOW PT'S PAIN GOAL Description: <3 on a 0-10 pain scale Outcome: Progressing

## 2019-10-04 ENCOUNTER — Inpatient Hospital Stay (HOSPITAL_COMMUNITY): Payer: Medicaid Other | Admitting: Occupational Therapy

## 2019-10-04 ENCOUNTER — Inpatient Hospital Stay (HOSPITAL_COMMUNITY): Payer: Medicaid Other | Admitting: Physical Therapy

## 2019-10-04 ENCOUNTER — Inpatient Hospital Stay (HOSPITAL_COMMUNITY): Payer: Medicaid Other | Admitting: Speech Pathology

## 2019-10-04 LAB — BASIC METABOLIC PANEL
Anion gap: 11 (ref 5–15)
BUN: 7 mg/dL (ref 6–20)
CO2: 25 mmol/L (ref 22–32)
Calcium: 9.5 mg/dL (ref 8.9–10.3)
Chloride: 103 mmol/L (ref 98–111)
Creatinine, Ser: 0.68 mg/dL (ref 0.61–1.24)
GFR calc Af Amer: >60 mL/min (ref >60)
GFR calc non Af Amer: >60 mL/min (ref >60)
Glucose, Bld: 86 mg/dL (ref 70–99)
Potassium: 3.6 mmol/L (ref 3.5–5.1)
Sodium: 139 mmol/L (ref 135–145)

## 2019-10-04 LAB — VANCOMYCIN, TROUGH: Vancomycin Tr: 15 ug/mL (ref 15–20)

## 2019-10-04 MED ORDER — BACLOFEN 10 MG PO TABS
10.0000 mg | ORAL_TABLET | Freq: Two times a day (BID) | ORAL | Status: DC
  Administered 2019-10-04 – 2019-10-07 (×6): 10 mg via ORAL
  Filled 2019-10-04 (×6): qty 1

## 2019-10-04 MED ORDER — HYDROCORTISONE (PERIANAL) 2.5 % EX CREA
TOPICAL_CREAM | Freq: Two times a day (BID) | CUTANEOUS | Status: DC
  Filled 2019-10-04: qty 28.35

## 2019-10-04 MED ORDER — HYDROCORTISONE (PERIANAL) 2.5 % EX CREA
TOPICAL_CREAM | CUTANEOUS | Status: DC | PRN
  Filled 2019-10-04: qty 28.35

## 2019-10-04 NOTE — Progress Notes (Signed)
Pharmacy Antibiotic Note  Matthew Black is a 43 y.o. male transferred from acute to CIR on  09/06/2019.  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  Pharmacy has been consulted to continue dosing/monitoring cefepime and vancomycin for brain abscess/cerebritis.   Pt was started on vancomycin and cefepime on 08/20/19, with planned duration of 6-8 weeks per ID. On transfer to CIR, he was receiving cefepime 2 gm IV Q 8 hrs and vancomycin 1250 mg IV Q 8 hrs, with goal vancomycin trough level of 15-20 mg/L.   A Vancomycin trough this AM is therapeutic (VT 15 mcg/ml, goal of 15-20 mcg/ml). SCr fluctuates between 0.5-0.8 - most recently down to 0.68 << 0.76. ID rechecking CT to evaluate resolution of abscess.   Plan: Cont Vancomycin 1500 mg IV q12h (goal vanc trough of 15-20 mg/L) Continue Cefepime 2g IV q8h Re-check levels as needed  Height: 5\' 11"  (180.3 cm) Weight: 189 lb 13.1 oz (86.1 kg) IBW/kg (Calculated) : 75.3  Temp (24hrs), Avg:99 F (37.2 C), Min:98.7 F (37.1 C), Max:99.6 F (37.6 C)  Recent Labs  Lab 09/28/19 0333 09/29/19 1411 10/01/19 0343 10/03/19 0449 10/04/19 0527  WBC  --   --   --  5.7  --   CREATININE 0.59*  --  0.78 0.76 0.68  VANCOTROUGH  --  14*  --   --  15    Estimated Creatinine Clearance: 129.4 mL/min (by C-G formula based on SCr of 0.68 mg/dL).    Allergies  Allergen Reactions  . Hydrocodone Nausea Only    Antimicrobials this admission: 9/21 Zosyn >> 9/26 9/26 Cefepime >> (11/20) 9/26 Vancomycin >> (11/20)  Microbiology  results: 9/27 BCx X 2: NG/final 9/17  UCx: Pseudomonas aeruginosa (suscept to Zosyn) 9/14  Abdominal fluid: Gram stain: rate WBCs, NOS; cx: NG/final 9/27 Epidural abscess: Gram stain: mod WBCs (PMNs), NOS; cx: NG/final 9/27 MRSA PCR: positive  Thank you for allowing pharmacy to be a part of this patient's care.  Alycia Rossetti, PharmD, BCPS Clinical Pharmacist Clinical phone for 10/04/2019: [phone] 10/04/2019 9:37 AM   **Pharmacist phone directory can now be found on [email or web address] ([email or web address]).  Listed under [place].

## 2019-10-04 NOTE — Progress Notes (Signed)
Physical Therapy Session Note  Patient Details  Name: Matthew Black MRN: [id number] Date of Birth: Mar 09, 1976  Today's Date: 10/04/2019 PT Individual Time: [id number] PT Individual Time Calculation (min): 70 min   Short Term Goals: Week 4:  PT Short Term Goal 1 (Week 4): STG = LTG due to estimated d/c date.  Skilled Therapeutic Interventions/Progress Updates:  Pt received in TIS w/c & wishing to eat lunch. Provided pt with lunch tray & pt consumed a few bites. Pt with c/o & behaviors demonstrating discomfort in buttocks. Offered to take pt to gym to get out of chair for pressure relief and after extended time pt reports need to be changed. Pt transferred sit<>stand at sink with mod assist with max cuing to push with RUE to standing. Pt requires max assist for standing balance and to fully extend L knee to place foot flat on floor. Pt lets go of sink and requires max assist to control descent backwards into chair. Pt returns to sitting & therapist provides total assist for changing brief & pt performs anterior peri hygiene. Assisted pt into rental TIS w/c from [place] and assisted pt with proper positioning in chair. Pt then required max encouragement/education from PT & SLP for further participation in session. Transported pt to ortho gym and attempted to assist pt with sit>stand to use dynavision with pt requiring max assist +2 but pt shaking his head and returning to sitting in w/c so pt assisted back to room. Pt completes squat pivot w/c>bed with mod assist with multiple scoots. Pt requires max assist for sitting>R sidelying>supine with max cuing. After bed mobility pt holds head with watery eyes but does not speak to therapist, instead calls wife. Pt requires max encouragement to tell therapist what's wrong & finally reports pressure in his head. RN made aware & vitals assessed: BP = 116/83 mmHg (LUE), HR = 81 bpm. PA ([name]) also made aware of pt's complaints. Pt left in bed in care of  RN.  No family present for caregiver training.  Therapy Documentation Precautions:  Precautions Precautions: Fall, Other (comment) Precaution Comments: crani helmet when OOB Required Braces or Orthoses: Other Brace Restrictions Weight Bearing Restrictions: No     Therapy/Group: Individual Therapy  [name] 10/04/2019, 2:21 PM

## 2019-10-04 NOTE — Progress Notes (Signed)
    [facility]   Reason for visit: Follow up on cerebral abscess  Interval History: CT scan done yesterday, remains in rehab.  Has continued on vancomycin and cefepime for a projected 8 weeks through 11/20.  No fever, WBC yesterday WNL.   CT with no signs of the previous large fluid collection.    Physical Exam: Constitutional:  Vitals:   10/03/19 2211 10/04/19 0517  BP: 127/72 118/83  Pulse: 75 67  Resp:  18  Temp:  99.6 F (37.6 C)  SpO2:  100%   patient appears in NAD, up in his wheelchair Eyes: anicteric HENT: open wound on scalp with good tissue no surrounding erythema, no drainage Respiratory: Normal respiratory effort; CTA B Cardiovascular: RRR GI: soft, nt, nd  Review of Systems: Unable to be assessed due to mental status  Lab Results  Component Value Date   WBC 5.7 10/03/2019   HGB 11.8 (L) 10/03/2019   HCT 37.2 (L) 10/03/2019   MCV 86.1 10/03/2019   PLT 255 10/03/2019    Lab Results  Component Value Date   CREATININE 0.68 10/04/2019   BUN 7 10/04/2019   NA 139 10/04/2019   K 3.6 10/04/2019   CL 103 10/04/2019   CO2 25 10/04/2019    Lab Results  Component Value Date   ALT 38 09/26/2019   AST 17 09/26/2019   ALKPHOS 67 09/26/2019     Microbiology: No results found for this or any previous visit (from the past 240 hour(s)).  Impression/Plan:  1. Brain abscess - no concerns noted on CT scan.  He has been on prolonged IV antibiotics.  His 8 week course to assure resolution is about completed and with the reassuring CT scan and clinical findings, he can stop his IV antibiotics at discharge this week, a few days early, when he leaves.   2.  Medication monitoring - his creat has remained wnl.

## 2019-10-04 NOTE — Progress Notes (Signed)
Occupational Therapy Session Note  Patient Details  Name: Matthew Black MRN: [id number] Date of Birth: 09-25-1976  Today's Date: 10/04/2019 OT Individual Time: 0845-1000 OT Individual Time Calculation (min): 75 min   Short Term Goals: Week 4:  OT Short Term Goal 1 (Week 4): STG=LTG secndary to ELOS  Skilled Therapeutic Interventions/Progress Updates:    Pt greeted semi-reclined in bed and agreeable to OT treatment session. Pt had not eaten breakfast and nurse tech was preparing food for pt. Pt agreeable to get up to wc and eating from wc with encouragement. Prior to transfer, OT had pt roll to the L, then OT handed pt wash cloth and he was able to wash his bottom with verbal cues and set-up A. Mod A to the roll to the R enough to pull brief through and fasten. Pt had smear of BM when washing bottom, but stated he did not need to go to the bathroom. Pt came to sitting EOB with mod A and max verbal cues. Pt needed close supervision/CGA for sitting balance. Pt completed UB bathing at EOB with mod verbal cues and hand over hand A to integrate L UE for neuro re-ed. Nursing entered to assist with threading IV through shirt sleeve. With max cues, pt able to pick up L UE witH R and place in arm hole. Pt needed mod A to don shirt. OT thread L LE into pants, then worked on anterior weight shifting and trunk control while pt trying to put R LE in pants-still needed OT assistance but improved weight shift. Sit<>stand at EOB with mod A and use of hemi walker for balance once standing while OT pulled pants up. Pt with increased flexor withdrawal on L LE requiring facilitation to try to maintain weight bearing through LLE in standing. Max A Stand-pivot to wc on L side. Pt set-up for breakfast with verbal cues to locate food items on L side of plate. OT issued drop arm padded BSC seat as pt has difficulty tolerating hard seat of BSC. Pt also needs drop arm as pt's transfers can flucuate and sometimes will need  slideboard for transfers. Pt completed grooming tasks from wc at the sink with verbal cues to locate items on L side of the sink. OT educated on uses of L UE as a stabilizer to hold toothbrush, then pt able to apply toothpaste. Pt brushed teeth without cues. OT reviewed self-ROM with pt and went through them with pt. Pt left tilted in wc with alarm belt on, call bell in reach, and nursing present.   Therapy Documentation Precautions:  Precautions Precautions: Fall, Other (comment) Precaution Comments: crani helmet when OOB Required Braces or Orthoses: Other Brace Restrictions Weight Bearing Restrictions: No Pain:  denies pain at this time   Therapy/Group: Individual Therapy  Valma Cava 10/04/2019, 9:48 AM

## 2019-10-04 NOTE — Progress Notes (Signed)
[place] PHYSICAL MEDICINE & REHABILITATION PROGRESS NOTE   Subjective/Complaints:  Lying in bed. Indicates that he's been working hard in therapies. Has mask on, trying to adjust placement  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 EXAM: CT HEAD WITHOUT CONTRAST TECHNIQUE: Contiguous axial images were obtained from the base of the skull through the vertex without intravenous contrast. COMPARISON:  MRI 08/20/2019. CT 08/20/2019 FINDINGS: Brain: Previous large right craniectomy. No focal finding seen affecting the brainstem or cerebellum primarily. Wallerian degeneration of the brainstem on the right. Encephalomalacia, gliosis and volume loss affecting the right ACA and MCA territories with resolution of the swelling and protrusion through the craniectomy. Some persistent low level density in the right temporoparietal junction region related to previous hematoma in that region. No evidence of recent/acute hemorrhage. There is no midline shift. Ex vacuo enlargement of the right lateral ventricle relative to the left as expected. No left hemisphere lesion is seen. No extra-axial fluid collection. Vascular: No acute vascular finding. Skull: Right craniectomy as above. Sinuses/Orbits: Clear/normal Other: There is some subgaleal fluid on the right at the posterior margin of the craniectomy, with some tenting of the skin. The nature of this fluid is indeterminate by CT and it could represent CSF leak, seroma or infected fluid. IMPRESSION: Previous large right craniectomy. Encephalomalacia, gliosis and volume loss affecting the right ACA and MCA territories with resolution of the swelling and previous protrusion through the craniectomy. Some persistent low level density in the right temporoparietal junction region related to previous hematoma in that region.  No evidence of recent/acute hemorrhage. Subgaleal fluid on the right at the posterior margin of the craniectomy with some tenting of the skin. This fluid is of indeterminate nature and could represent CSF leak, seroma or infected fluid. Electronically Signed   By: Nelson Chimes M.D.   On: 10/03/2019 17:01   Recent Labs    10/03/19 0449  WBC 5.7  HGB 11.8*  HCT 37.2*  PLT 255   Recent Labs    10/03/19 0449 10/04/19 0527  NA 138 139  K 3.8 3.6  CL 101 103  CO2 27 25  GLUCOSE 93 86  BUN 8 7  CREATININE 0.76 0.68  CALCIUM 9.5 9.5    Intake/Output Summary (Last 24 hours) at 10/04/2019 0955 Last data filed at 10/03/2019 1900 Gross per 24 hour  Intake 17640 ml  Output -  Net 17640 ml     Physical Exam: Vital Signs Blood pressure 118/83, pulse 67, temperature 99.6 F (37.6 C), temperature source Oral, resp. rate 18, height 5\' 11"  (1.803 m), weight 86.1 kg, SpO2 100 %. Constitutional: No distress . Vital signs reviewed. HEENT: EOMI, oral membranes moist Neck: supple Cardiovascular: RRR without murmur. No JVD    Respiratory: CTA Bilaterally without wheezes or rales. Normal effort    GI: BS +, non-tender, non-distended  Skin: area of hypergranulation/scar tissue on posterior aspect of crani incision, with mild fluctuance, expressed some tan material from wound---tender to palpation/pressure Psych: flat   Musc: No edema in extremities.  No tenderness in extremities. Neuro: Alert, initiating more, engaging more. 0/5 LUE and LLE. Left inattention persists, tone 1+/4 LUE and LLE--no motor changes RUE/RLE appear to be 5/5 proximal distal---stable  Assessment/Plan: 1. Functional deficits secondary to right MCA/ACA infarct which require 3+ hours per day of interdisciplinary therapy in a comprehensive inpatient rehab  setting.  Physiatrist is providing close team supervision and 24 hour management of active medical problems listed below.  Physiatrist and rehab team continue to assess  barriers to discharge/monitor patient progress toward functional and medical goals  Care Tool:  Bathing    Body parts bathed by patient: Left arm, Chest, Abdomen, Right upper leg, Left upper leg, Face   Body parts bathed by helper: Right arm     Bathing assist Assist Level: Moderate Assistance - Patient 50 - 74%     Upper Body Dressing/Undressing Upper body dressing   What is the patient wearing?: Hospital gown only    Upper body assist Assist Level: Moderate Assistance - Patient 50 - 74%    Lower Body Dressing/Undressing Lower body dressing      What is the patient wearing?: Pants, Incontinence brief     Lower body assist Assist for lower body dressing: Total Assistance - Patient < 25%     Toileting Toileting Toileting Activity did not occur Landscape architect and hygiene only): N/A (no void or bm)  Toileting assist Assist for toileting: Maximal Assistance - Patient 25 - 49%     Transfers Chair/bed transfer  Transfers assist     Chair/bed transfer assist level: 2 Helpers     Locomotion Ambulation   Ambulation assist   Ambulation activity did not occur: Safety/medical concerns          Walk 10 feet activity   Assist  Walk 10 feet activity did not occur: Safety/medical concerns        Walk 50 feet activity   Assist Walk 50 feet with 2 turns activity did not occur: Safety/medical concerns         Walk 150 feet activity   Assist Walk 150 feet activity did not occur: Safety/medical concerns         Walk 10 feet on uneven surface  activity   Assist Walk 10 feet on uneven surfaces activity did not occur: Safety/medical concerns         Wheelchair     Assist Will patient use wheelchair at discharge?: Yes Type of Wheelchair: Manual Wheelchair activity did not occur: Safety/medical concerns         Wheelchair 50 feet with 2 turns activity    Assist    Wheelchair 50 feet with 2 turns activity did not occur:  Safety/medical concerns       Wheelchair 150 feet activity     Assist  Wheelchair 150 feet activity did not occur: Safety/medical concerns       Blood pressure 118/83, pulse 67, temperature 99.6 F (37.6 C), temperature source Oral, resp. rate 18, height 5\' 11"  (1.803 m), weight 86.1 kg, SpO2 100 %. Medical Problem List and Plan: 1.Left-sided weakness/dysphagiasecondary to right MCA/ACA infarction withR ICA,R MCAocclusion with cerebral edema status post hemicraniectomy with abdominal flap implant [id number] complicated bycerebral abscess/empyema hemorrhagic conversion  Continue CIR PT, OT, SLP  PRAFO, WHO    -continue to wear helmet when up with therapies as he tolerates     2. Antithrombotics: -DVT/anticoagulation:Right peroneal, left popliteal, left posterior tibial and left peroneal DVT. Status post IVC filter 07/21/2019 with plan for retrieval 8 to 12 weeks. Patient currently remains on subcutaneous heparin. No plan for long-term anticoagulation -antiplatelet therapy: N/A 3. Pain Management:Tramadol, tylenol as needed   -kpad for back and neck  -continue scheduled baclofen for spasticity, may help neck and back pain too  - continue prn robaxin 4. 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 Pt and wife found his input helpful. Team to continue to help him work on coping strategies   -pt engaged more spontaneously then I've seen so far while here 5. Neuropsych: This patientis notcapable of making decisions on hisown behalf.  -ritalin initiated for arousal/initiation/depression 6. Skin/Wound Care:Routine skin checks  -continue to observe right scalp wound/granulation. Appears to be gradually improving. Keep clean. Doesn't need betadine. Avoid irritation as possible 7. Fluids/Electrolytes/Nutrition:Routine in and outs.   Hypokalemia-3.7 11/4--->3.8 11/9  -continue supplementing   -prealbumin 26.0 10/19  -I personally reviewed the patient's labs today.   8. Post stroke dysphagia. upgraded to Dysphagia #2withthin liquids.   -needs cueing/assistance with meals 9. Seizure prophylaxis. Keppra 500 mg twice daily, no seizure activity noted 10. Cerebral abscess/empyema/cerebritis. Cultures no growth to date. Continue IV vancomycin and cefepime 08/20/2019 x 6 to 8 weeks per infectious disease.  Vanc dosing per pharmacy (Tr 14 on 11/5)  -scalp wound   culture negative. Repeat negative also   -wbc's 6.0 11/2---> 5.7 11/9   -cxr unremarkable   -ua negative    -NS has seen/aware    -suspect scalp wound is fibronecrotic tissue within scalp    -PICC replaced at new site 11. Pseudomonas UTI. Zosyn completed.  12. Hypertension with tachycardia. Lopressor 50 mg every 8 hours.   -controlled 11/10 13. History of tobacco marijuana use. Urine drug screen positive marijuana. Provide counseling with family  69. Syncopal episode last week  -likely vasovagal  -no further issues reported     LOS: 28 days A FACE TO FACE EVALUATION WAS PERFORMED  Meredith Staggers 10/04/2019, 9:55 AM

## 2019-10-04 NOTE — Progress Notes (Signed)
  Patient ID: Matthew Black, male   DOB: 06-22-1976, 43 y.o.   MRN: [id number]   Diagnosis code: I69.354  Height:  5'11"  Weight:  194 obs   Patient has severe hemiplegia due to a large MCA/ACA infarction  which requires extremeties to be positioned in ways not feasible with a normal bed.  Head must be elevated at least 30 % or he is at risk for further craniotomy site swelling and aspiration risk.      Lauraine Rinne, PA-C

## 2019-10-04 NOTE — Patient Care Conference (Signed)
Inpatient RehabilitationTeam Conference and Plan of Care Update Date: 10/04/2019   Time: 10:45 AM   Patient Name: Matthew Black      Medical Record Number: [id number]  Date of Birth: 01-Oct-1976 Sex: Male         Room/Bed: 4W14C/4W14C-01 Payor Info: Payor: MEDICAID [place] / Plan: MEDICAID [place] ACCESS / Product Type: *No Product type* /    Admit Date/Time:  09/06/2019  3:37 PM  Primary Diagnosis:  Right middle cerebral artery stroke [facility])  Patient Active Problem List   Diagnosis Date Noted  . Reactive depression   . Wound infection after surgery   . Low grade fever   . Sleep disturbance   . Dysphagia, post-stroke   . Transaminitis   . 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 Cerebral abscess   . Urinary tract infection without hematuria   . Altered mental status   . Primary hypercoagulable state ([place])   . Acute pulmonary embolism without acute cor pulmonale (HCC)   . Deep vein thrombosis (DVT) of non-extremity vein   . Hypokalemia   . FUO (fever of unknown origin)   . Acute blood loss anemia   . SIRS (systemic inflammatory response syndrome) (HCC)   . Leukocytosis   . Endotracheal tube present   . Acute respiratory failure with hypoxemia ([place])   . Stroke (cerebrum) ([place]) 07/19/2019  . Pressure injury of skin 07/19/2019  . Acute CVA (cerebrovascular accident) ([place])   . Encephalopathy   . Dysphagia   . Acute encephalopathy   . Essential hypertension   . Annual physical exam 09/11/2016  . Obesity 03/12/2016  . PCP NOTES >>>>>>>>>>>>>>>>>. 03/12/2016  . Scalp abscess 12/20/2015  . Neck abscess 12/20/2015  . Pilonidal cyst 02/08/2013    Expected Discharge Date: Expected Discharge Date: 10/07/19  Team Members Present: Physician leading conference: Dr. Alger Simons Social Worker Present: Lennart Pall, LCSW Nurse Present: Dorthula Nettles, RN Case manager: Karene Fry, RN PT Present: Lavone Nian, PT OT Present: Cherylynn Ridges, OT SLP Present: Weston Anna, SLP PPS Coordinator present : Gunnar Fusi, SLP     Current Status/Progress Goal Weekly Team Focus  Bowel/Bladder   pt incontinent of B&B, pt refused condom cath due to irritation on skin, LBM 11/9  time toileting  assess toileting q shift and prn   Swallow/Nutrition/ Hydration   Regular textures with thin liquids, supervision  Supervision  Family Education   ADL's   Bathing Mod A +2 for sit<>stands, LB dressing, max A, Mod/max A stand-pivot transfers, sitting balanxe CGA  Mod A overall  L attention, pt/family education, dunctional transfers, self-care retraining L UE NMR   Mobility   mod/max assist bed mobility, mod<>+2 bed<>chair transfers, decreased engagement  mod assist overall, w/c level  bed mobility, transfers, TIS w/c eval, postural control & midline, hands on caregiver training, d/c planning, w/c mobility as able   Communication   Min A  Supervision-Min A  Family Education   Safety/Cognition/ Behavioral Observations  Min A  Min A  Family Education   Pain   pt has c/o pain, has scheduled and prns  pain less than 4  assess pain q shift and prn   Skin   abcess on right side of head, appears to have no drainage as of now, new orders to cleanse and keep clean (betadine d/c)  prevent further skin breakdown  assess skin q shift and prn    Rehab Goals Patient on target to meet rehab goals: Yes *See  Care Plan and progress notes for long and short-term goals.     Barriers to Discharge  Current Status/Progress Possible Resolutions Date Resolved   Nursing                  PT  Inaccessible home environment;Incontinence;Decreased caregiver support;Lack of/limited family support  pt needs ramp & wife aware, unsure if wife can provide necessary level of care upon d/c              OT                  SLP                SW                Discharge Planning/Teaching Needs:  Pt to return home with wife as primary caregiver with additional assistance from children and  extended family.  Formal education with wife (and parents) has begun.   Team Discussion: L side tone, wound draining, neuro monitoring, more engaged, depressed about being in hospital.  Rn - refused 6 AM heparin, took other meds, inc at times, head wound is tender.  OT mod B/D, mod/max stand pivot, mod A goals, doing fam ed with wife.  PT max +1-2, does not like helmet, fam ed this week.  SLP reg, thins, voice improving, basic cognition work, MD recommends helmet be worn at home.   Revisions to Treatment Plan: N/A     Medical Summary Current Status: ongoing left hemiparesis. wound still draining--->consvt care at present. ?mood better. Weekly Focus/Goal: improving tone, improving mood/engagement/motivation  Barriers to Discharge: IV antibiotics;Medical stability       Continued Need for Acute Rehabilitation Level of Care: The patient requires daily medical management by a physician with specialized training in physical medicine and rehabilitation for the following reasons: Direction of a multidisciplinary physical rehabilitation program to maximize functional independence : Yes Medical management of patient stability for increased activity during participation in an intensive rehabilitation regime.: Yes Analysis of laboratory values and/or radiology reports with any subsequent need for medication adjustment and/or medical intervention. : Yes   I attest that I was present, lead the team conference, and concur with the assessment and plan of the team.   [name] 10/05/2019, 2:54 PM  Team conference was held via web/ teleconference due to [place] - 19

## 2019-10-04 NOTE — Progress Notes (Signed)
Speech Language Pathology Daily Session Note  Patient Details  Name: Matthew Black MRN: [id number] Date of Birth: Mar 25, 1976  Today's Date: 10/04/2019 SLP Individual Time: 1100-1155 SLP Individual Time Calculation (min): 55 min  Short Term Goals: Week 4: SLP Short Term Goal 1 (Week 4): STGs=LTGs due to ELOS  Skilled Therapeutic Interventions: Skilled treatment session focused on cognitive and dysphagia goals. SLP facilitated session by providing Mod-Max A verbal and visual cues for patient to scan to the left of midline and for problem solving for complex problem solving during a complex calendar task. Patient also consumed thin liquids via straw without overt s/s of aspiration, therefore, recommend patient upgrade to straws. Patient left upright in wheelchair with alarm on and all needs within reach. Continue with current plan of care.      Pain No/Denies Pain   Therapy/Group: Individual Therapy  Jaimes Eckert 10/04/2019, 12:00 PM

## 2019-10-05 ENCOUNTER — Encounter (HOSPITAL_COMMUNITY): Payer: Medicaid Other

## 2019-10-05 ENCOUNTER — Encounter (HOSPITAL_COMMUNITY): Payer: Medicaid Other | Admitting: Speech Pathology

## 2019-10-05 ENCOUNTER — Ambulatory Visit (HOSPITAL_COMMUNITY): Payer: Medicaid Other | Admitting: Physical Therapy

## 2019-10-05 ENCOUNTER — Encounter (HOSPITAL_COMMUNITY): Payer: Medicaid Other | Admitting: Psychology

## 2019-10-05 NOTE — Progress Notes (Signed)
Physical Therapy Session Note  Patient Details  Name: Matthew Black MRN: [id number] Date of Birth: Feb 12, 1976  Today's Date: 10/05/2019 PT Individual Time: [id number] PT Individual Time Calculation (min): 70 min   Short Term Goals: Week 4:  PT Short Term Goal 1 (Week 4): STG = LTG due to estimated d/c date.  Skilled Therapeutic Interventions/Progress Updates:   Pt in supine and agreeable to therapy, no c/o pain. Pt reporting his brief needed to be changed. R/L rolling w/ mod-max assist while therapist and tech performed pericare and brief management w/ total assist. Pt's wife and mom present for caregiver education this morning. Educated mom on sit<>supine transfers and providing min-mod assist. Pt needed to don pants and pt's wife educated mom on how to don pants at EOB and sit<>stand transfer to [employer]. Wife gave correct and helpful cues for technique, body positioning including gait belt use, and safety. Pt's mom performed same task correctly and safely w/ min cues from therapist for body position and anterior weight shifting technique. Educated mom on sitting balance and need for close supervision for static balance and up to mod-max assist to correct LOB. Therapist educated pt's mom on slide board transfers in both directions including board placement, head/hips relationship, and pt UE/LE positioning. Mom returned demonstration w/ min physical assist needed from therapist when going towards affected/L side. Therapist educated mom on squat pivots to/from w/c while pt's wife performed the hands-on part. Wife needed no cues for safety, pt's mom needed min cues from therapist when she returned demonstration. Pt's mom would benefit from continued hands-on training and practice w/ both slide board and squat pivot transfers, she plans to return for OT session tomorrow AM. Demonstrated TIS functions and parts management to pt's mom who verbalized understanding and returned demonstration. Practiced car  transfer on fake car as it was raining outside during session, practiced at [place] as pt's wife states this is what they will primarily use. Wife performed squat pivot to/from car seat w/ min assist needed from therapist to pull pt's hips back into car seat. Both in agreement that slide board would be better for getting to car, will practice this during real car transfer tomorrow. Wife performed squat pivot back to TIS w/o assist from therapist. Returned to room and ended session in TIS, all needs in reach.   Therapy Documentation Precautions:  Precautions Precautions: Fall, Other (comment) Precaution Comments: crani helmet when OOB Required Braces or Orthoses: Other Brace Restrictions Weight Bearing Restrictions: No Pain: Pain Assessment Pain Scale: 0-10 Pain Score: 0-No pain  Therapy/Group: Individual Therapy  Alicha Raspberry K Kayleigh Broadwell 10/05/2019, 11:56 AM

## 2019-10-05 NOTE — Progress Notes (Signed)
Occupational Therapy Session Note  Patient Details  Name: Matthew Black MRN: [id number] Date of Birth: [birth year]  Today's Date: 10/05/2019 OT Individual Time: 1400-1510 OT Individual Time Calculation (min): 70 min    Short Term Goals: Week 4:  OT Short Term Goal 1 (Week 4): STG=LTG secndary to ELOS  Skilled Therapeutic Interventions/Progress Updates:    Session focused on family education with hands on practice with pt's wife. Focus on reducing caregiver burden, teaching/reinforcing use of correct body mechanics to reduce risk of injury, and general safety information. Pt completed bed mobility to EOB with mod A, wife providing facilitation at the LLE and trunk. Pt completed stand pivot transfer to the [facility] with min A (provided by wife) toward the R side to [facility]. Pt able to stand x3 for clothing management and peri hygiene following BM. Pt able to complete peri hygiene in standing with wife providing balance support. Pt returned to bed, transferring toward the L side with mod A. Pt was left supine with all needs met, wife present.  Therapy Documentation Precautions:  Precautions Precautions: Fall, Other (comment) Precaution Comments: crani helmet when OOB Required Braces or Orthoses: Other Brace Restrictions Weight Bearing Restrictions: No   Therapy/Group: Individual Therapy  Curtis Sites 10/05/2019, 3:45 PM

## 2019-10-05 NOTE — Progress Notes (Signed)
[facility] PHYSICAL MEDICINE & REHABILITATION PROGRESS NOTE   Subjective/Complaints:  Sitting up in bed eating his breakfast. Nods no in response to questions regarding pain, constipation, nausea.   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 EXAM: CT HEAD WITHOUT CONTRAST TECHNIQUE: Contiguous axial images were obtained from the base of the skull through the vertex without intravenous contrast. COMPARISON:  MRI 08/20/2019. CT 08/20/2019 FINDINGS: Brain: Previous large right craniectomy. No focal finding seen affecting the brainstem or cerebellum primarily. Wallerian degeneration of the brainstem on the right. Encephalomalacia, gliosis and volume loss affecting the right ACA and MCA territories with resolution of the swelling and protrusion through the craniectomy. Some persistent low level density in the right temporoparietal junction region related to previous hematoma in that region. No evidence of recent/acute hemorrhage. There is no midline shift. Ex vacuo enlargement of the right lateral ventricle relative to the left as expected. No left hemisphere lesion is seen. No extra-axial fluid collection. Vascular: No acute vascular finding. Skull: Right craniectomy as above. Sinuses/Orbits: Clear/normal Other: There is some subgaleal fluid on the right at the posterior margin of the craniectomy, with some tenting of the skin. The nature of this fluid is indeterminate by CT and it could represent CSF leak, seroma or infected fluid. IMPRESSION: Previous large right craniectomy. Encephalomalacia, gliosis and volume loss affecting the right ACA and MCA territories with resolution of the swelling and previous protrusion through the craniectomy. Some persistent low level density in the right temporoparietal junction region related to previous hematoma in that  region. No evidence of recent/acute hemorrhage. Subgaleal fluid on the right at the posterior margin of the craniectomy with some tenting of the skin. This fluid is of indeterminate nature and could represent CSF leak, seroma or infected fluid. Electronically Signed   By: Nelson Chimes M.D.   On: 10/03/2019 17:01   Recent Labs    10/03/19 0449  WBC 5.7  HGB 11.8*  HCT 37.2*  PLT 255   Recent Labs    10/03/19 0449 10/04/19 0527  NA 138 139  K 3.8 3.6  CL 101 103  CO2 27 25  GLUCOSE 93 86  BUN 8 7  CREATININE 0.76 0.68  CALCIUM 9.5 9.5    Intake/Output Summary (Last 24 hours) at 10/05/2019 0941 Last data filed at 10/05/2019 0815 Gross per 24 hour  Intake 2559.93 ml  Output -  Net 2559.93 ml     Physical Exam: Vital Signs Blood pressure 107/70, pulse 70, temperature 98.3 F (36.8 C), resp. rate 18, height 5\' 11"  (1.803 m), weight 86.1 kg, SpO2 100 %. Constitutional: No distress . Vital signs reviewed. Sitting up in bed eating breakfast.  HEENT: EOMI, oral membranes moist Neck: supple Cardiovascular: RRR without murmur. No JVD    Respiratory: CTA Bilaterally without wheezes or rales. Normal effort    GI: BS +, non-tender, non-distended  Skin: area of hypergranulation/scar tissue on posterior aspect of crani incision, with mild fluctuance, expressed some tan material from wound---tender to palpation/pressure Psych: flat, does not respond verbally to my questions, but nods and shakes his head.  Musc: No edema in extremities.  No tenderness in extremities. Neuro: Alert, initiating more, engaging more. 0/5 LUE and LLE. Left inattention persists, tone 1+/4 LUE and LLE--no motor changes RUE/RLE appear to be 5/5 proximal distal---stable  Assessment/Plan: 1. Functional deficits secondary to right  MCA/ACA infarct which require 3+ hours per day of interdisciplinary therapy in a comprehensive inpatient rehab setting.  Physiatrist is providing close team supervision and 24 hour  management of active medical problems listed below.  Physiatrist and rehab team continue to assess barriers to discharge/monitor patient progress toward functional and medical goals  Care Tool:  Bathing    Body parts bathed by patient: Left arm, Chest, Abdomen, Right upper leg, Left upper leg, Face   Body parts bathed by helper: Right arm     Bathing assist Assist Level: Moderate Assistance - Patient 50 - 74%     Upper Body Dressing/Undressing Upper body dressing   What is the patient wearing?: Hospital gown only    Upper body assist Assist Level: Moderate Assistance - Patient 50 - 74%    Lower Body Dressing/Undressing Lower body dressing      What is the patient wearing?: Pants, Incontinence brief     Lower body assist Assist for lower body dressing: Total Assistance - Patient < 25%     Toileting Toileting Toileting Activity did not occur Landscape architect and hygiene only): N/A (no void or bm)  Toileting assist Assist for toileting: Maximal Assistance - Patient 25 - 49%     Transfers Chair/bed transfer  Transfers assist     Chair/bed transfer assist level: Moderate Assistance - Patient 50 - 74%     Locomotion Ambulation   Ambulation assist   Ambulation activity did not occur: Safety/medical concerns          Walk 10 feet activity   Assist  Walk 10 feet activity did not occur: Safety/medical concerns        Walk 50 feet activity   Assist Walk 50 feet with 2 turns activity did not occur: Safety/medical concerns         Walk 150 feet activity   Assist Walk 150 feet activity did not occur: Safety/medical concerns         Walk 10 feet on uneven surface  activity   Assist Walk 10 feet on uneven surfaces activity did not occur: Safety/medical concerns         Wheelchair     Assist Will patient use wheelchair at discharge?: Yes Type of Wheelchair: Manual Wheelchair activity did not occur: Safety/medical concerns          Wheelchair 50 feet with 2 turns activity    Assist    Wheelchair 50 feet with 2 turns activity did not occur: Safety/medical concerns       Wheelchair 150 feet activity     Assist  Wheelchair 150 feet activity did not occur: Safety/medical concerns       Blood pressure 107/70, pulse 70, temperature 98.3 F (36.8 C), resp. rate 18, height 5\' 11"  (1.803 m), weight 86.1 kg, SpO2 100 %. 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 Antithrombotics: -DVT/anticoagulation:Right peroneal, left popliteal, left posterior tibial and left peroneal DVT. Status post IVC filter 07/21/2019 with plan for retrieval 8 to 12 weeks. Patient currently remains on subcutaneous heparin. No plan for long-term anticoagulation -antiplatelet therapy: N/A 3. Pain Management:Tramadol, tylenol as needed   -kpad for back and neck  -continue scheduled baclofen for spasticity, may help neck and back  pain too  - continue prn robaxin 4. Mood:Provide motional support -antipsychotic agents: N/A  - reactive depression- celexa increased to 20mg    -appreciate neuropsych eval. Pt and wife found his input helpful. Team to continue to help him work on coping strategies   -pt engaged more spontaneously then I've seen so far while here 5. Neuropsych: This patientis notcapable of making decisions on hisown behalf.  -ritalin initiated for arousal/initiation/depression 6. Skin/Wound Care:Routine skin checks  -continue to observe right scalp wound/granulation. Appears to be gradually improving. Keep clean. Doesn't need betadine. Avoid irritation  as possible 7. Fluids/Electrolytes/Nutrition:Routine in and outs.  Hypokalemia-3.7 11/4--->3.8 11/9  -continue supplementing   -prealbumin 26.0 10/19  -I personally reviewed the patient's labs today.   8. Post stroke dysphagia. upgraded to Dysphagia #2withthin liquids.   -needs cueing/assistance with meals 9. Seizure prophylaxis. Keppra 500 mg twice daily, no seizure activity noted 10. Cerebral abscess/empyema/cerebritis. Cultures no growth to date. Continue IV vancomycin and cefepime 08/20/2019 x 6 to 8 weeks per infectious disease.  Vanc dosing per pharmacy (Tr 14 on 11/5)  -scalp wound   culture negative. Repeat negative also   -wbc's 6.0 11/2---> 5.7 11/9   -cxr unremarkable   -ua negative    -NS has seen/aware    -suspect scalp wound is fibronecrotic tissue within scalp    -PICC replaced at new site 11. Pseudomonas UTI. Zosyn completed.  12. Hypertension with tachycardia. Lopressor 50 mg every 8 hours.   -controlled 11/10 13. History of tobacco marijuana use. Urine drug screen positive marijuana. Provide counseling with family  24. Syncopal episode last week  -likely vasovagal  -no further issues reported     LOS: 29 days A FACE TO FACE EVALUATION WAS PERFORMED  Clide Deutscher Paulkar 10/05/2019, 9:41 AM

## 2019-10-05 NOTE — Progress Notes (Addendum)
Physical Therapy Discharge Summary  Patient Details  Name: Matthew Black MRN: [id number] Date of Birth: [birth year]  Today's Date: 10/07/2019 PT Individual Time: 1030-1055 PT Individual Time Calculation (min): 25 min   Pt in TIS and agreeable to therapy, no c/o pain. Wife present for d/c today. Total assist w/c transport to outside of hospital. Max assist slide board transfer into car by therapist. Ended session in car w/ wife and ready for d/c, all needs met. Reinforced w/ wife donning helmet once at home since she had taken helmet home already. Discussed that pt is safe w/o helmet when therapist is providing direct supervision, but needs it prior to any mobility at home. Wife verbalized understanding and in agreement.   Patient has met 6 of 8 long term goals due to improved activity tolerance, improved balance, improved postural control, increased strength, ability to compensate for deficits, functional use of  left lower extremity, improved attention, improved awareness and improved coordination.  Patient to discharge at a wheelchair level Mod Assist for transfers. W/c propulsion and gait remain unsafe at this time.  Patient's care partner is independent to provide the necessary physical assistance at discharge. Pt's wife and mother have been present for multiple family education sessions and have been educated on providing assistance for bed mobility, transfers, and ADLs.   Reasons goals not met: W/c self-propulsion remains non-functional this time, pt remains using TIS w/c.   Recommendation:  Patient will benefit from ongoing skilled PT services in home health setting to continue to advance safe functional mobility, address ongoing impairments in LLE NMR and muscle activation, postural control and sitting balance, and global endurance, and minimize fall risk.  Equipment: TIS, slide board, and hemiwalker   Reasons for discharge: treatment goals met and discharge from  hospital  Patient/family agrees with progress made and goals achieved: Yes  PT Discharge Precautions/Restrictions Precautions Precautions: Fall;Other (comment) Precaution Comments: helmet when OOB Restrictions Weight Bearing Restrictions: No Vision/Perception  Perception Perception: Impaired(L inattention) Praxis Praxis: Impaired Praxis Impairment Details: Motor planning;Initiation  Cognition Overall Cognitive Status: Impaired/Different from baseline Arousal/Alertness: Awake/alert Orientation Level: Oriented X4 Memory: Impaired Awareness: Impaired Problem Solving: Impaired Safety/Judgment: Impaired Sensation Sensation Light Touch: Impaired by gross assessment(LLE diminished overall, distal toes absent) Motor  Motor Motor: Hemiplegia;Abnormal tone Motor - Discharge Observations: Dense L hemiplegia remains, increased L hamstring and quad tone  Mobility Bed Mobility Bed Mobility: Rolling Right;Rolling Left;Supine to Sit;Sit to Supine Rolling Right: Minimal Assistance - Patient > 75% Rolling Left: Moderate Assistance - Patient 50-74% Supine to Sit: Moderate Assistance - Patient 50-74% Sit to Supine: Moderate Assistance - Patient 50-74% Transfers Transfers: Sit to Stand;Stand to Sit;Squat Pivot Transfers Sit to Stand: Minimal Assistance - Patient > 75% Stand to Sit: Minimal Assistance - Patient > 75% Squat Pivot Transfers: Minimal Assistance - Patient > 75%(min assist to R, mod assist to L) Transfer (Assistive device): None Locomotion  Gait Ambulation: No Gait Gait: No Stairs / Additional Locomotion Stairs: No Wheelchair Mobility Wheelchair Mobility: No  Trunk/Postural Assessment  Cervical Assessment Cervical Assessment: Exceptions to WFL(increased cervical protraction, cervical stiffness globally but most prominant w/ L rotation and sidebending) Thoracic Assessment Thoracic Assessment: Exceptions to WFL(rounded shoulders) Lumbar Assessment Lumbar Assessment:  Exceptions to WFL(posterior pelvic tilt) Postural Control Postural Control: Deficits on evaluation Head Control: much improved since eval, able to maintain neutral (within pt's ROM limitations) for 1-2 min at a time before fatigued Trunk Control: much improved since eval, able to reach midline w/o cues, however needs cues  to return to midline w/ any postural perturbation Righting Reactions: delayed/insufficient Protective Responses: delayed/insufficient  Balance Balance Balance Assessed: Yes Static Sitting Balance Static Sitting - Level of Assistance: 5: Stand by assistance Dynamic Sitting Balance Dynamic Sitting - Level of Assistance: 4: Min assist Static Standing Balance Static Standing - Level of Assistance: 4: Min assist Dynamic Standing Balance Dynamic Standing - Level of Assistance: 2: Max assist Extremity Assessment  RLE Assessment RLE Assessment: Within Functional Limits LLE Assessment LLE Assessment: Exceptions to WFL(Dense L hemi remains)    Martavius Lusty K Jones Viviani 10/05/2019, 6:12 PM

## 2019-10-05 NOTE — Discharge Summary (Signed)
Physician Discharge Summary  Patient ID: Matthew Black MRN: [id number] DOB/AGE: Jul 08, 1976 43 y.o.  Admit date: 09/06/2019 Discharge date: 10/07/2019  Discharge Diagnoses:  Principal Problem:   Right middle cerebral artery stroke [facility]) Active Problems:   Dysphagia, post-stroke   Transaminitis   Sleep disturbance   Wound infection after surgery   Low grade fever   Reactive depression Right peroneal, left popliteal, left posterior tibial and left peroneal DVT.  Status post IVC filter 07/21/2019 Pain management Cerebral abscess/empyema/cerebritis Pseudomonas UTI Hypertension History of tobacco and marijuana use  Discharged Condition: Stable  Significant Diagnostic Studies: Dg Chest 2 View  Result Date: 09/21/2019 CLINICAL DATA:  Fever EXAM: CHEST - 2 VIEW COMPARISON:  August 17, 2019 FINDINGS: Stable position of right PICC line with tip overlying the right atrium. Enteric tube is no longer present. The lungs remain clear. There is no pleural effusion or pneumothorax. The cardiomediastinal silhouette is within normal limits. Osseous structures are unremarkable. IMPRESSION: No active cardiopulmonary disease. Electronically Signed   By: Macy Mis M.D.   On: 09/21/2019 15:43   Ct Head Wo Contrast  Result Date: 10/03/2019 CLINICAL DATA:  Followup right MCA territory stroke. Intracranial abscess and granuloma. EXAM: CT HEAD WITHOUT CONTRAST TECHNIQUE: Contiguous axial images were obtained from the base of the skull through the vertex without intravenous contrast. COMPARISON:  MRI 08/20/2019. CT 08/20/2019 FINDINGS: Brain: Previous large right craniectomy. No focal finding seen affecting the brainstem or cerebellum primarily. Wallerian degeneration of the brainstem on the right. Encephalomalacia, gliosis and volume loss affecting the right ACA and MCA territories with resolution of the swelling and protrusion through the craniectomy. Some persistent low level density in the right  temporoparietal junction region related to previous hematoma in that region. No evidence of recent/acute hemorrhage. There is no midline shift. Ex vacuo enlargement of the right lateral ventricle relative to the left as expected. No left hemisphere lesion is seen. No extra-axial fluid collection. Vascular: No acute vascular finding. Skull: Right craniectomy as above. Sinuses/Orbits: Clear/normal Other: There is some subgaleal fluid on the right at the posterior margin of the craniectomy, with some tenting of the skin. The nature of this fluid is indeterminate by CT and it could represent CSF leak, seroma or infected fluid. IMPRESSION: Previous large right craniectomy. Encephalomalacia, gliosis and volume loss affecting the right ACA and MCA territories with resolution of the swelling and previous protrusion through the craniectomy. Some persistent low level density in the right temporoparietal junction region related to previous hematoma in that region. No evidence of recent/acute hemorrhage. Subgaleal fluid on the right at the posterior margin of the craniectomy with some tenting of the skin. This fluid is of indeterminate nature and could represent CSF leak, seroma or infected fluid. Electronically Signed   By: Nelson Chimes M.D.   On: 10/03/2019 17:01   Dg Swallowing Func-speech Pathology  Result Date: 09/15/2019 Objective Swallowing Evaluation: Type of Study: MBS-Modified Barium Swallow Study  Patient Details Name: Matthew Black MRN: [id number] Date of Birth: May 27, 1976 Today's Date: 09/15/2019 Time: SLP Start Time (ACUTE ONLY): 0905 -SLP Stop Time (ACUTE ONLY): 0930 SLP Time Calculation (min) (ACUTE ONLY): 25 min Past Medical History: Past Medical History: Diagnosis Date . 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 CRANIOTOMY Right 07/19/2019  Procedure: RIGHT HEMI-CRANIECTOMY With implantation of skull flap to abdominal wall;  Surgeon: Consuella Lose, MD;  Location: [place];  Service: Neurosurgery;  Laterality: Right; . CYST EXCISION N/A  10/08/2016  Procedure: EXCISION OF POSTERIOR NECK CYST;  Surgeon: Clovis Riley, MD;  Location: [facility];  Service: General;  Laterality: N/A; . INCISION AND DRAINAGE ABSCESS N/A 09/22/2014  Procedure: INCISION AND DRAINAGE ABSCESS POSTERIOR NECK;  Surgeon: Pedro Earls, MD;  Location: [facility];  Service: General;  Laterality: N/A; . INCISION AND DRAINAGE ABSCESS N/A 12/20/2015  Procedure: INCISION AND DRAINAGE POSTERIOR NECK MASS;  Surgeon: Armandina Gemma, MD;  Location: [facility];  Service: General;  Laterality: N/A; . INCISION AND DRAINAGE ABSCESS Left 07/10/2004  middle finger . IR IVC FILTER PLMT / S&I /IMG GUID/MOD SED  07/21/2019 . IR VENOGRAM RENAL UNI RIGHT  07/21/2019 . MASS EXCISION N/A 07/21/2017  Procedure: EXCISION OF BENIGN NECK LESION WITH LAYERED CLOSURE;  Surgeon: Irene Limbo, MD;  Location: [place];  Service: Plastics;  Laterality: N/A; . MASS EXCISION N/A 11/10/2017  Procedure: EXCISION BENIGN LESION OF THE NECK WITH LAYERED CLOSURE;  Surgeon: Irene Limbo, MD;  Location: [place];  Service: Plastics;  Laterality: N/A; HPI: See H&P  Subjective: Pt seen in radiology for MBS Assessment / Plan / Recommendation CHL IP CLINICAL IMPRESSIONS 09/15/2019 Clinical Impression Patient demonstrates a mild oropharyngeal dysphagia. Oral phase is characterized by reduced labial seal, prolonged mastication and AP transit with solid textures. Patient consistently triggers his swallow at the pyriform sinuses with thin liquids resulting in flash penetration with large, consecutive sips via straw.  Suspect difficulty is due to decreased timing/coordination from difficulty managing the straw due to labial weakness. No pharyngeal residue noted throughout study.  Recommend patient continue Dys. 2 textures and upgrade to thin liquids. SLP Visit Diagnosis Dysphagia, oropharyngeal phase  (R13.12) Attention and concentration deficit following -- Frontal lobe and executive function deficit following -- Impact on safety and function Mild aspiration risk   CHL IP TREATMENT RECOMMENDATION 09/15/2019 Treatment Recommendations Therapy as outlined in treatment plan below   Prognosis 09/15/2019 Prognosis for Safe Diet Advancement Good Barriers to Reach Goals Cognitive deficits Barriers/Prognosis Comment -- CHL IP DIET RECOMMENDATION 09/15/2019 SLP Diet Recommendations Dysphagia 2 (Fine chop) solids;Thin liquid Liquid Administration via Cup;No straw Medication Administration Crushed with puree Compensations Minimize environmental distractions;Slow rate;Small sips/bites;Lingual sweep for clearance of pocketing Postural Changes Seated upright at 90 degrees   CHL IP OTHER RECOMMENDATIONS 09/15/2019 Recommended Consults -- Oral Care Recommendations Oral care BID Other Recommendations --   CHL IP FOLLOW UP RECOMMENDATIONS 09/15/2019 Follow up Recommendations Home health SLP   CHL IP FREQUENCY AND DURATION 09/15/2019 Speech Therapy Frequency (ACUTE ONLY) min 5x/week Treatment Duration 3 weeks      CHL IP ORAL PHASE 09/15/2019 Oral Phase Impaired Oral - Pudding Teaspoon -- Oral - Pudding Cup -- Oral - Honey Teaspoon -- Oral - Honey Cup -- Oral - Nectar Teaspoon NT Oral - Nectar Cup Left anterior bolus loss Oral - Nectar Straw NT Oral - Thin Teaspoon -- Oral - Thin Cup -- Oral - Thin Straw Left anterior bolus loss Oral - Puree Left anterior bolus loss Oral - Mech Soft Left anterior bolus loss;Delayed oral transit Oral - Regular -- Oral - Multi-Consistency -- Oral - Pill NT Oral Phase - Comment --  CHL IP PHARYNGEAL PHASE 09/15/2019 Pharyngeal Phase Impaired Pharyngeal- Pudding Teaspoon -- Pharyngeal -- Pharyngeal- Pudding Cup -- Pharyngeal -- Pharyngeal- Honey Teaspoon -- Pharyngeal -- Pharyngeal- Honey Cup -- Pharyngeal -- Pharyngeal- Nectar Teaspoon NT Pharyngeal -- Pharyngeal- Nectar Cup Delayed swallow  initiation-vallecula Pharyngeal -- Pharyngeal- Nectar Straw NT Pharyngeal -- Pharyngeal- Thin  Teaspoon -- Pharyngeal -- Pharyngeal- Thin Cup Delayed swallow initiation-pyriform sinuses Pharyngeal -- Pharyngeal- Thin Straw Penetration/Aspiration during swallow;Delayed swallow initiation-pyriform sinuses Pharyngeal Material enters airway, remains ABOVE vocal cords then ejected out Pharyngeal- Puree Delayed swallow initiation-vallecula Pharyngeal -- Pharyngeal- Mechanical Soft Delayed swallow initiation-vallecula Pharyngeal -- Pharyngeal- Regular -- Pharyngeal -- Pharyngeal- Multi-consistency -- Pharyngeal -- Pharyngeal- Pill NT Pharyngeal -- Pharyngeal Comment --  CHL IP CERVICAL ESOPHAGEAL PHASE 09/15/2019 Cervical Esophageal Phase WFL Pudding Teaspoon -- Pudding Cup -- Honey Teaspoon -- Honey Cup -- Nectar Teaspoon -- Nectar Cup -- Nectar Straw -- Thin Teaspoon -- Thin Cup -- Thin Straw -- Puree -- Mechanical Soft -- Regular -- Multi-consistency -- Pill -- Cervical Esophageal Comment -- PAYNE, COURTNEY 09/15/2019, 3:54 PM Weston Anna, MA, CCC-SLP [phone]              [place] Ekg Site Rite  Result Date: 09/23/2019 If Site Rite image not attached, placement could not be confirmed due to current cardiac rhythm.   Labs:  Basic Metabolic Panel: Recent Labs  Lab 10/01/19 0343 10/03/19 0449 10/04/19 0527 10/07/19 0421  NA 137 138 139 139  K 3.8 3.8 3.6 4.0  CL 103 101 103 102  CO2 26 27 25 27   GLUCOSE 92 93 86 95  BUN 8 8 7 15   CREATININE 0.78 0.76 0.68 0.77  CALCIUM 9.8 9.5 9.5 9.3    CBC: Recent Labs  Lab 10/03/19 0449  WBC 5.7  HGB 11.8*  HCT 37.2*  MCV 86.1  PLT 255    CBG: Recent Labs  Lab 10/03/19 2139  GLUCAP 96   Family history.  Family of diabetes.  Denies any colon cancer or rectal cancer  Brief HPI:   Matthew Black is a 43 y.o. right-handed male with unremarkable past medical history on no prescription medications who quit smoking 3 years ago.  Lives with spouse  independent prior to admission.  Presented 07/19/2019 after being found down.  He had dried vomit around his mouth and was covered in urine.  Nonverbal and not moving left side.  CT of the head showed a large right hemisphere infarction with confluence cytotoxic edema in the right ACA and MCA territories.  CT cervical spine negative.  MRI and MRA showed right ICA occlusion.  Large right hemisphere infarction mostly sparing the right PCA territory.  Alcohol negative urine drug screen positive marijuana, creatinine 1.43, urine culture no growth, Covid negative.  Patient underwent decompressive right hemicraniectomy with abdominal flap implant 07/19/2019 per Dr. Kathyrn Sheriff.  Echocardiogram with ejection fraction of 65%.  Bubble study negative for PFO.  EEG with severe diffuse encephalopathy no seizure noted.  Hospital course further complicated by lower extremity Doppler showing a DVT right peroneal, left popliteal, left posterior tibial and left peroneal.  He underwent IVC filter placement 07/21/2019 with plan retrieval in approximately 12 weeks.  He was not a candidate for long-term anticoagulation due to craniotomy.  He was later placed on subcutaneous heparin for DVT prophylaxis.  Nasogastric tube initially in place for nutritional support diet slowly advanced to a dysphagia #2 nectar thick liquid.  Hospital course complicated by cerebral abscess empyema likely cerebritis per CT of the head 08/20/2019 and MRI completed patient noted to have herniation of brain into the craniectomy site with large irregular enhancing fluid collection along the anterior margin of the craniectomy measuring approximately 5.4 x 9.8 cm.  He underwent aspiration 08/21/2019 per Dr. Kathyrn Sheriff and cultures sent showing no growth.  He was not a candidate  for surgery due to increasing herniation.  ID consulted placed on IV vancomycin and cefepime 926 2020 x 6 to 8 weeks.  He also developed a Pseudomonas UTI completed course of Zosyn.  Palliative  care was consulted to establish goals of care.  Patient was admitted for a comprehensive rehab program.   Hospital Course: Matthew Black was admitted to rehab 09/06/2019 for inpatient therapies to consist of PT, ST and OT at least three hours five days a week. Past admission physiatrist, therapy team and rehab RN have worked together to provide customized collaborative inpatient rehab.  Pertaining to patient right MCA ACA with right ICA occlusion with cerebral edema status post hemicraniectomy abdominal flap implant [id number] complicated by cerebral abscess with empyema hemorrhagic conversion.  He was followed closely by neurosurgery.  Noted IVC filter placed 07/21/2019 for extensive bilateral lower extremity DVTs he would follow-up with interventional radiology for retrieval removal.  There was no plan for long-term anticoagulation.  Pain managed with use of tramadol as needed schedule baclofen for spasticity Robaxin as needed.  Patient was maintained on Ritalin to help regain focus and attention to task.  Keppra for seizure prophylaxis no seizure activity.  Close monitoring of right scalp wound granulation appeared to be gradually improving advised to keep clean does not need Betadine avoid irritation if possible.  His diet was slowly advanced to a regular consistency.  In regards to patient's cerebral abscess empyema cerebritis culture showed no growth followed by infectious disease follow-up CT imaging stable it was recommended he can discontinue his antibiotics at time of discharge.  His blood pressures remain controlled he continued on Lopressor.  Patient did have a history of tobacco marijuana use received a full count regards to cessation of illicit drug products marijuana or alcohol.   Blood pressures were monitored on TID basis and stable  .He/ has made gains during rehab stay and is attending therapies  He/ will continue to receive follow up therapies   after discharge  Rehab course: During  patient's stay in rehab weekly team conferences were held to monitor patient's progress, set goals and discuss barriers to discharge. At admission, patient required +2 physical assist sit to stand.  Max assist side-lying to sitting, total assist supine to sit.  Total assist toileting as well as hygiene total assist functional mobility during ADLs   Physical exam.  Blood pressure 129/91 pulse 90 temperature 99.1 respirations 18 oxygen saturations 100% room air Constitutional well-nourished no distress HEENT Mouth/throat.  Oropharynx is clear and moist no exudate Craniectomy site small amount of drainage mild swelling Pupils reactive to light no discharge without nystagmus Neck supple nontender no tracheal deviation no thyromegaly Cardiovascular normal rate regular rhythm no friction rub or murmur heard Respiratory effort normal no respiratory distress no wheezes GI.  Soft no distention nontender without rebound Musculoskeletal normal range of motion Comments.  Left central 7.  Does track to all visual fields although has some right gaze preference.  Dense left hemiparesis 0 out of 5 left upper left lower extremity at least 3 - 4 out of 5 right upper and right lower extremity.  Senses pain to right side.  He/  has had improvement in activity tolerance, balance, postural control as well as ability to compensate for deficits. He/ has had improvement in functional use RUE/LUE  and RLE/LLE as well as improvement in awareness.  Ongoing education with wife.  Sits edge of bed sit to stand transfers with hemiwalker and assistance of his wife.  Educated wife and mother on sitting balance and need for close supervision for static balance and up to mod max assist to correct loss of balance.  Practice car transfers sedan height wife perform squat pivot to car seat with minimal assist needed from therapist.  Patient came to sitting edge of bed with moderate assist and max verbal cues.  Needed close supervision  contact-guard for sitting balance with ADLs completed upper body bathing at edge of bed with moderate verbal cues.  Speech therapy did discuss with wife and mother on patient's cognitive linguistic deficits.  Full teaching completed plan discharge to home       Disposition: Discharge disposition: 01-Home or Self Care     Discharge to home   Diet: Regular  Special Instructions: No driving smoking or alcohol  Call interventional radiology Dr. Markus Daft to discuss arrangements for retrievable IVC filter  Follow-up neurosurgery Dr. Kathyrn Sheriff in regards to discussion for cranioplasty  Medications at discharge. 1.  Tylenol as needed 2.  Baclofen 10 mg p.o. twice daily 3.  Celexa 20 mg p.o. nightly 4.  Pepcid 20 mg p.o. twice daily 5.  Keppra 500 mg p.o. twice daily 6.  Robaxin 500 mg every 6 hours as needed muscle spasms 7.  Ritalin 10 mg p.o. twice daily 8.  Lopressor 50 mg p.o. every 8 hours 9.  Ultram 50 mg every 6 hours as needed pain  Discharge Instructions    Ambulatory referral to Neurology   Complete by: As directed    An appointment is requested in approximately 4 to 6 weeks right MCA infarction   Ambulatory referral to Physical Medicine Rehab   Complete by: As directed    Moderate complexity follow-up 1 to 2 weeks right MCA infarction      Follow-up Information    Meredith Staggers, MD Follow up.   Specialty: Physical Medicine and Rehabilitation Why: Office to call for appointment Contact information: [address] [place] [address] [phone]        Consuella Lose, MD Follow up.   Specialty: Neurosurgery Why: call for follow up 1-2 weeks Contact information: [address] [place] [address] [phone]        Markus Daft, MD Follow up.   Specialties: Interventional Radiology, Radiology Why: Call for appointment on arrangements for retrievable IVC filter Contact information: [address]  [place] [address] [phone]        Tommy Medal, Lavell Islam, MD Follow up.   Specialty: Infectious Diseases Why: call for appointment Contact information: [address] [place] [address] [phone]           Signed: Lavon Paganini [place] 10/07/2019, 5:24 AM

## 2019-10-05 NOTE — Progress Notes (Signed)
Speech Language Pathology Daily Session Note  Patient Details  Name: Matthew Black MRN: [id number] Date of Birth: [birth year]  Today's Date: 10/05/2019 SLP Individual Time: 1300-1355 SLP Individual Time Calculation (min): 55 min  Short Term Goals: Week 4: SLP Short Term Goal 1 (Week 4): STGs=LTGs due to ELOS  Skilled Therapeutic Interventions: Skilled treatment session focused on speech goals and continued education with the patient's wife and mother. SLP facilitated session by providing extra time and Min A verbal cues for initiation of verbal expression for responses to clinician questions.  Patient's wife and mother present and educated in regards to patient's current swallowing and cognitive-linguistic function and strategies to utilize at home to maximize cognitive functioning, safety and overall verbal expression at home. Both verbalized understanding. Patient left upright in bed with alarm on and all needs within reach. Continue with current plan of care.      Pain Pain Assessment Pain Scale: 0-10 Pain Score: 9  Pain Type: Acute pain Pain Location: Head Pain Orientation: Right;Left Pain Descriptors / Indicators: Headache Pain Frequency: Intermittent Pain Onset: Sudden Patients Stated Pain Goal: 0 Pain Intervention(s): Medication (See eMAR)  Therapy/Group: Individual Therapy  Matthew Black 10/05/2019, 3:12 PM

## 2019-10-05 NOTE — Consult Note (Signed)
Neuropsychological Consultation   Patient:   Matthew Black   DOB:   28-Jun-1976  MR Number:  [id number]  Location:  [place] [id number] [place] [address] Dept: [place]: [id number]           Date of Service:   10/05/2019  Start Time:   9 AM End Time:   10 AM  Provider/Observer:  Ilean Skill, Psy.D.       Clinical Neuropsychologist       Billing Code/Service: [id number]  Chief Complaint:    Matthew Black is a 43 year old male with unremarkable past med history on no prescription medications.  Presented 07/19/2019 after being found down.  Noverbal and not moving his left side.  CT head showed large right hemisphere infarction with confluence cytotoxic edema in the right ACA and MCA territories.  Evidence of large vessel occlusion.  MRI/MRA showed right ICA occlusion.  Large right hemisphere infarction.  Patient underwent decompressive right hemicraniectomy with abdominal flap implant.  Hospital course further comlicated by DVT.  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  MRI noted to have herniation of brain into the craniectomy site with large irregular enhancing fluid collection.  Patient has now been admitted for CIR program.  He has been making some functional gains but concerns about development of depressive symptoms as he gains awareness of deficits.    Reason for Service:  Patient was referred for neuropsychological consultation due to coping and adjustment issues.  Below is the HPI for the current admission.  [id number] Matthew Black is a 43 year old right-handed male with unremarkable past medical history on no prescription medications. He quit smoking 3 years ago. 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 Presented 07/19/2019 after being found down. He had  dried vomit around his mouth and was covered in urine. Nonverbal not moving his left side. CT of the head showed a large right hemisphere infarction with confluence cytotoxic edema in the right ACA and MCA territories. Evidence of large vessel occlusion. CT cervical spine negative. MRI/MRA showed right ICA occlusion. 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 Alcohol negative, urine drug screen positive marijuana, creatinine 1.43, urine culture no growth, COVID negative. Patient underwent decompressive right hemicraniectomy with abdominal flap implant 07/19/2019 per Dr. Kathyrn Sheriff. Echocardiogram with ejection fraction of 65%. Bubble study negative for PFO. EEG with severe diffuse encephalopathy no seizure noted. Hospital course further complicated by lower extremity Doppler showing a DVT right peroneal, left popliteal, left posterior tibial and left peroneal. He underwent IVC filter placement 07/21/2019 with plan for retrieval in 8 to 12 weeks. He was not a candidate for long-term anticoagulation due to craniotomy but patient is currently maintained on subcutaneous heparin 5000 units every 8 hours. Patient n.p.o. with Cortrakfeeding tube for nutritional support and diet has been advanced to dysphagia #2 nectar liquid and nasogastric tube feeds discontinued. Keppra for seizure prophylaxis. He remained intubated 07/20/2019 through 07/25/2019. 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 He underwent aspiration on 08/21/2019 per Dr. Kathyrn Sheriff and cultures were sent with no growth to date. He was not a candidate for  surgery due to increasing herniation. ID consulted placed on IV vancomycin and cefepime  08/20/2019 x6 to 8 weeks. Patient also developed Pseudomonas UTI completing a course of Zosyn. Palliative care consulted 08/22/2019 to establish goals of care. Therapy evaluations completed and patient was admitted for a comprehensive rehab program.  Current Status:  Patient reports today that his mood has improved some and that he is feels he is making gains.  However, he still struggles with expressive language abilities.  Continued significant right brain injury deficits.   Behavioral Observation: MONTRICE TIDRICK  presents as a 43 y.o.-year-old Right African American Male who appeared his stated age. his dress was Appropriate and he was Well Groomed and his manners were Appropriate to the situation.  his participation was indicative of Appropriate and Redirectable behaviors.  There were any physical disabilities noted.  he displayed an appropriate level of cooperation and motivation.     Interactions:    Active Appropriate and Redirectable  Attention:   abnormal and attention span appeared shorter than expected for age  Memory:   abnormal; remote memory intact, recent memory impaired  Visuo-spatial:  not examined  Speech (Volume):  low  Speech:    Slowed, slurred but able to expresses self.  Thought Process:  Coherent and Relevant  Though Content:  WNL; not suicidal and not homicidal  Orientation:   person, place and situation  Judgment:   Good  Planning:   Poor  Affect:    Depressed  Mood:    Dysphoric  Insight:   Fair  Intelligence:   normal  Medical History:   Past Medical History:  Diagnosis Date  . 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 Diabetes Other        GF  . Healthy Mother   . Healthy Father      Impression/DX:  Matthew Black is a 43 year old male with unremarkable past med history on no prescription medications.  Presented 07/19/2019  after being found down.  Noverbal and not moving his left side.  CT head showed large right hemisphere infarction with confluence cytotoxic edema in the right ACA and MCA territories.  Evidence of large vessel occlusion.  MRI/MRA showed right ICA occlusion.  Large right hemisphere infarction.  Patient underwent decompressive right hemicraniectomy with abdominal flap implant.  Hospital course further comlicated by DVT.  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  MRI noted to have herniation of brain into the craniectomy site with large irregular enhancing fluid collection.  Patient has now been admitted for CIR program.  He has been making some functional gains but concerns about development of depressive symptoms as he gains awareness of deficits.    Patient reports today that his mood has improved some and that he is feels he is making gains.  However, he still struggles with expressive language abilities.  Continued significant right brain injury deficits.   Disposition/Plan:  Will follow-up with patient again next week.    Diagnosis:    Right middle cerebral artery stroke [facility]) - Plan: Ambulatory referral to Neurology, Ambulatory referral to Physical Medicine Rehab  Fever - Plan: DG Chest 2 View, DG Chest 2 View         Electronically Signed   _______________________ Ilean Skill, Psy.D.

## 2019-10-06 ENCOUNTER — Encounter (HOSPITAL_COMMUNITY): Payer: Medicaid Other | Admitting: Speech Pathology

## 2019-10-06 ENCOUNTER — Ambulatory Visit (HOSPITAL_COMMUNITY): Payer: Medicaid Other | Admitting: Physical Therapy

## 2019-10-06 ENCOUNTER — Encounter (HOSPITAL_COMMUNITY): Payer: Medicaid Other | Admitting: Occupational Therapy

## 2019-10-06 MED ORDER — LEVETIRACETAM 500 MG PO TABS: 500.0000 mg | ORAL_TABLET | Freq: Two times a day (BID) | ORAL | 1 refills | Status: DC

## 2019-10-06 MED ORDER — ACETAMINOPHEN 325 MG PO TABS: 650.0000 mg | ORAL_TABLET | ORAL | Status: DC

## 2019-10-06 MED ORDER — METHYLPHENIDATE HCL 10 MG PO TABS: 10.0000 mg | ORAL_TABLET | Freq: Two times a day (BID) | ORAL | 0 refills | Status: DC

## 2019-10-06 MED ORDER — METHOCARBAMOL 500 MG PO TABS: 500.0000 mg | ORAL_TABLET | Freq: Four times a day (QID) | ORAL | 0 refills | Status: DC | PRN

## 2019-10-06 MED ORDER — FAMOTIDINE 20 MG PO TABS: 20.0000 mg | ORAL_TABLET | Freq: Two times a day (BID) | ORAL | 0 refills | Status: DC

## 2019-10-06 MED ORDER — CITALOPRAM HYDROBROMIDE 20 MG PO TABS: 20.0000 mg | ORAL_TABLET | Freq: Every day | ORAL | 0 refills | Status: DC

## 2019-10-06 MED ORDER — METOPROLOL TARTRATE 50 MG PO TABS: 50.0000 mg | ORAL_TABLET | Freq: Three times a day (TID) | ORAL | 1 refills | Status: DC

## 2019-10-06 MED ORDER — BACLOFEN 10 MG PO TABS: 10.0000 mg | ORAL_TABLET | Freq: Two times a day (BID) | ORAL | 0 refills | Status: DC

## 2019-10-06 MED ORDER — TRAMADOL HCL 50 MG PO TABS: 50.0000 mg | ORAL_TABLET | Freq: Four times a day (QID) | ORAL | 0 refills | Status: DC | PRN

## 2019-10-06 NOTE — Progress Notes (Signed)
Occupational Therapy Discharge Summary  Patient Details  Name: Matthew Black MRN: [id number] Date of Birth: 08/13/1976  Today's Date: 10/06/2019 OT Individual Time: 1045-1200 OT Individual Time Calculation (min): 75 min   Patient has met 12 of 14 long term goals due to improved activity tolerance, improved balance, postural control, ability to compensate for deficits, functional use of  LEFT upper and LEFT lower extremity, improved attention, improved awareness and improved coordination.  Patient to discharge at overall Mod Assist level.  Patient's care partner is independent to provide the necessary physical and cognitive assistance at discharge.    Reasons goals not met: Pt still needs max A for LB dressing, and he will be limited to sponge bathing at home 2/2 full bath is upstairs.  Recommendation:  Patient will benefit from ongoing skilled OT services in home health setting to continue to advance functional skills in the area of BADL, Reduce care partner burden and functional use of L UE.  Equipment: hospital bed, TIS wc, slideboard, drop arm BSC, lap tray  Reasons for discharge: treatment goals met and discharge from hospital  Patient/family agrees with progress made and goals achieved: Yes   Pt greeted semi-reclined in bed with parents present and agreeable to OT treatment session focused on pt/family education and BADL retraining. Educated pt's family on bed mobility and pt's father assisted with this 2/2 mother receiving education yesterday. UB bathing/dressing completed seated EOB with overall close supervision with intermittent CGA for sitting balance. Educated on use of hemi walker and gait belt to assist with sit<>stands. Reviewed body mechanics and importance of L knee block with sit<>stands. Had pt's dad practice sit<>stands from EOB. LB dressing at EOB with max A, then pt's dad assisted with mod A sit<>stand, then pt able to help pull up pants. Educated on slideboard transfer to  drop arm BSC. Pts dad assisted with mod A slideboard transfers. Sit<>stand from [facility] with mod A, then +2 to pull down pants and sit on commode. Pt with successful BM. Worked on pt assisting with peri-care. On commode, pt can complete his peri-care with mod A, but on padded BSC, he had to stand with mod A, while +2 made sure pt was clean. Pts dad assisted with min A stand-pivot back to bed. OT educated on passive self ROM exercises and had his mom practice L UE PROM exercises in bed. Provided handout depicting exercises. Pt left semi-reclined in bed with bed alarm on and needs met.   OT Discharge Precautions/Restrictions  Precautions Precautions: Fall;Other (comment) Precaution Comments: helmet when OOB Restrictions Weight Bearing Restrictions: No Pain  denies pain ADL ADL Eating: Supervision/safety, Set up Grooming: Supervision/safety, Setup Upper Body Bathing: Minimal assistance Lower Body Bathing: Moderate assistance Upper Body Dressing: Minimal assistance Lower Body Dressing: Maximal assistance Toileting: Moderate assistance Toilet Transfer: Moderate assistance Tub/Shower Transfer: Moderate assistance Perception  Perception: Impaired(L inattention) Cognition Overall Cognitive Status: Impaired/Different from baseline Arousal/Alertness: Awake/alert Orientation Level: Oriented X4 Attention: Selective Focused Attention: Appears intact Selective Attention: Impaired Memory: Impaired Memory Impairment: Decreased short term memory Awareness: Impaired Problem Solving: Impaired Problem Solving Impairment: Functional basic Safety/Judgment: Impaired Sensation Coordination Gross Motor Movements are Fluid and Coordinated: No Fine Motor Movements are Fluid and Coordinated: No Coordination and Movement Description: dense L hemi Motor  Motor Motor: Hemiplegia;Abnormal tone Motor - Discharge Observations: Dense L hemiplegia remains, increased flexor tone Mobility  Bed Mobility Bed  Mobility: Rolling Right;Rolling Left;Supine to Sit;Sit to Supine Rolling Right: Minimal Assistance - Patient > 75% Rolling Left:  Moderate Assistance - Patient 50-74% Supine to Sit: Moderate Assistance - Patient 50-74% Sit to Supine: Moderate Assistance - Patient 50-74% Transfers Sit to Stand: Minimal Assistance - Patient > 75%;Moderate Assistance - Patient 50-74% Stand to Sit: Minimal Assistance - Patient > 75%;Moderate Assistance - Patient 50-74%  Trunk/Postural Assessment  Cervical Assessment Cervical Assessment: Exceptions to WFL(increased cervical protraction, cervical stiffness globally but most prominant w/ L rotation and sidebending) Thoracic Assessment Thoracic Assessment: Exceptions to WFL(rounded shoulders) Lumbar Assessment Lumbar Assessment: Exceptions to WFL(posterior pelvic tilt) Postural Control Postural Control: Deficits on evaluation  Balance Static Sitting Balance Static Sitting - Level of Assistance: 5: Stand by assistance Dynamic Sitting Balance Dynamic Sitting - Level of Assistance: 4: Min assist;5: Stand by assistance Sitting balance - Comments: (CGA/supervision) Static Standing Balance Static Standing - Level of Assistance: 4: Min assist Dynamic Standing Balance Dynamic Standing - Level of Assistance: 4: Min assist;3: Mod assist Extremity/Trunk Assessment RUE Assessment RUE Assessment: Within Functional Limits LUE Assessment LUE Assessment: Exceptions to [facility] General Strength Comments: Pt with flexor tone throughout L UE- no voluntary movement noted LUE Body System: Neuro Brunstrum levels for arm and hand: Arm;Hand Brunstrum level for arm: Stage II Synergy is developing Brunstrum level for hand: Stage II Synergy is developing LUE Tone LUE Tone: Moderate  Daneen Schick Samaj Wessells 10/06/2019, 12:58 PM

## 2019-10-06 NOTE — Progress Notes (Signed)
Speech Language Pathology Discharge Summary  Patient Details  Name: Matthew Black MRN: [id number] Date of Birth: [birth year]  Today's Date: 10/06/2019 SLP Individual Time: 1300-1400 SLP Individual Time Calculation (min): 60 min   Skilled Therapeutic Interventions:  Skilled treatment session focused on completion of family education with the patient's parents and cognitive goals. Patient's parents asking appropriate questions in regards to cognitive-linguistic deficits, more specifically word-finding deficits and left inattention. SLP provided his parents strategies to maximize word-finding and visual scanning as well as handouts to reinforce information. SLP also facilitated session by administering the MoCA-Version 7.3. Patient scored 23/30 points with a score of 26 or above considered normal with deficits in attention and executive functioning. However, patient's score increased by 7 points from initial administration on 11/2. Patient left upright in bed with alarm on and all needs within reach.   Patient has met 6 of 6 long term goals.  Patient to discharge at overall Supervision;Min level.   Reasons goals not met: N/A   Clinical Impression/Discharge Summary: Patient has made excellent gains and has met 6 of 6 LTGs this admission. Currently, patient is consuming regular textures with thin liquids without overt s/s of aspiration and with intermittent verbal cues for use of swallowing compensatory strategies. Patient can verbally express his wants/needs at the phrase level and participate in a basic conversation with extra time and supervision level verbal cues needed for word-finding. Patient demonstrates a hoarse vocal quality and requires supervision level verbal cues for use of an increased vocal intensity to maximize his overall speech intelligibility. Patient has also made functional gains in cognition and requires overall Min A verbal cues to complete functional and familiar tasks safely in  regards to attention and recall and Mod A verbal cues for problem solving, awareness and attention to left field of environment.  Patient and family education is complete and patient will discharge home with 24 hour supervision from family. Patient would benefit from f/u SLP services to maximize his cognitive-linguistic function and overall functional independence to reduce caregiver burden.   Care Partner:  Caregiver Able to Provide Assistance: Yes  Type of Caregiver Assistance: Physical;Cognitive  Recommendation:  Home Health SLP;24 hour supervision/assistance  Rationale for SLP Follow Up: Maximize functional communication;Maximize cognitive function and independence;Reduce caregiver burden   Equipment: N/A   Reasons for discharge: Discharged from hospital;Treatment goals met   Patient/Family Agrees with Progress Made and Goals Achieved: Yes    [name] 10/06/2019, 6:58 AM

## 2019-10-06 NOTE — Progress Notes (Signed)
[place] PHYSICAL MEDICINE & REHABILITATION PROGRESS NOTE   Subjective/Complaints:  Sitting up for breakfast. Asked me how I was doing. Anxious to go home.  ROS: limited due to language/communication    Objective:   No results found. No results for input(s): WBC, HGB, HCT, PLT in the last 72 hours. Recent Labs    10/04/19 0527  NA 139  K 3.6  CL 103  CO2 25  GLUCOSE 86  BUN 7  CREATININE 0.68  CALCIUM 9.5    Intake/Output Summary (Last 24 hours) at 10/06/2019 1001 Last data filed at 10/05/2019 1833 Gross per 24 hour  Intake 840 ml  Output -  Net 840 ml     Physical Exam: Vital Signs Blood pressure 110/77, pulse 76, temperature 98 F (36.7 C), temperature source Oral, resp. rate 18, height 5\' 11"  (1.803 m), weight 86.1 kg, SpO2 100 %. Constitutional: No distress . Vital signs reviewed. HEENT: EOMI, oral membranes moist Neck: supple Cardiovascular: RRR without murmur. No JVD    Respiratory: CTA Bilaterally without wheezes or rales. Normal effort    GI: BS +, non-tender, non-distended  Skin:     Psych: flat, does not respond verbally to my questions, but nods and shakes his head.  Musc: No edema in extremities.  No tenderness in extremities. Neuro: Alert, initiating more, engaging more. 0/5 LUE and LLE. Left inattention persists, tone tr to 1/4 LUE and LLE--no motor changes RUE/RLE appear to be 5/5 proximal distal--stable  Assessment/Plan: 1. 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  Physiatrist is providing close team supervision and 24 hour management of active medical problems listed below.  Physiatrist and rehab team continue to assess barriers to discharge/monitor patient progress toward functional and medical goals  Care Tool:  Bathing    Body parts bathed by patient: Left arm, Chest, Abdomen, Right upper leg, Left upper leg, Face    Body parts bathed by helper: Right arm     Bathing assist Assist Level: Moderate Assistance - Patient 50 - 74%     Upper Body Dressing/Undressing Upper body dressing   What is the patient wearing?: Hospital gown only    Upper body assist Assist Level: Moderate Assistance - Patient 50 - 74%    Lower Body Dressing/Undressing Lower body dressing      What is the patient wearing?: Pants, Incontinence brief     Lower body assist Assist for lower body dressing: Total Assistance - Patient < 25%     Toileting Toileting Toileting Activity did not occur Landscape architect and hygiene only): N/A (no void or bm)  Toileting assist Assist for toileting: Maximal Assistance - Patient 25 - 49%     Transfers Chair/bed transfer  Transfers assist     Chair/bed transfer assist level: Maximal Assistance - Patient 25 - 49%     Locomotion Ambulation   Ambulation assist   Ambulation activity did not occur: Safety/medical concerns          Walk 10 feet activity   Assist  Walk 10 feet activity did not occur: Safety/medical concerns        Walk 50 feet activity   Assist Walk 50 feet with 2 turns activity did not occur: Safety/medical concerns         Walk 150 feet activity   Assist Walk 150 feet activity did not occur: Safety/medical concerns         Walk  10 feet on uneven surface  activity   Assist Walk 10 feet on uneven surfaces activity did not occur: Safety/medical concerns         Wheelchair     Assist Will patient use wheelchair at discharge?: Yes Type of Wheelchair: Manual Wheelchair activity did not occur: Safety/medical concerns         Wheelchair 50 feet with 2 turns activity    Assist    Wheelchair 50 feet with 2 turns activity did not occur: Safety/medical concerns       Wheelchair 150 feet activity     Assist  Wheelchair 150 feet activity did not occur: Safety/medical concerns       Blood pressure 110/77,  pulse 76, temperature 98 F (36.7 C), temperature source Oral, resp. rate 18, height 5\' 11"  (1.803 m), weight 86.1 kg, SpO2 100 %. Medical Problem List and Plan: 1.Left-sided weakness/dysphagiasecondary to right MCA/ACA infarction withR ICA,R MCAocclusion with cerebral edema status post hemicraniectomy with abdominal flap implant [id number] complicated bycerebral abscess/empyema hemorrhagic conversion  Continue CIR PT, OT, SLP  PRAFO, WHO    -discharge home 11/13  -Patient to see Dr. Ranell Patrick in the office for transitional care encounter in 1-2 weeks.   -needs NS f/u in 1-2 weeks for wound as well 2. Antithrombotics: -DVT/anticoagulation:Right peroneal, left popliteal, left posterior tibial and left peroneal DVT. Status post IVC filter 07/21/2019 with plan for retrieval 8 to 12 weeks. Patient currently remains on subcutaneous heparin. No plan for long-term anticoagulation -antiplatelet therapy: N/A 3. Pain Management:Tramadol, tylenol as needed   -kpad for back and neck  -continue scheduled baclofen for spasticity  - continue prn robaxin 4. Mood:Provide motional support -antipsychotic agents: N/A  - reactive depression- celexa increased to 20mg    -appreciate neuropsych eval. Pt and wife found his input helpful. Team to continue to help him work on coping strategies   -pt engaged more spontaneously then I've seen so far while here 5. Neuropsych: This patientis notcapable of making decisions on hisown behalf.  -ritalin initiated for arousal/initiation/depression 6. Skin/Wound Care:Routine skin checks  -continue to observe right scalp wound/granulation. Slowly improving  -needs NS f/u as outpt 7. Fluids/Electrolytes/Nutrition:Routine in and outs.  Hypokalemia-3.7 11/4--->3.8 11/9  -continue supplementing   -prealbumin 26.0 10/19  -I personally reviewed the patient's labs today.   8. Post stroke dysphagia. upgraded to Dysphagia  #2withthin liquids.   -needs cueing/assistance with meals 9. Seizure prophylaxis. Keppra 500 mg twice daily, no seizure activity noted 10. Cerebral abscess/empyema/cerebritis. Cultures no growth to date. Continue IV vancomycin and cefepime 08/20/2019 x 6 to 8 weeks per infectious disease.  Dc abx and PICC at discharge 11. Pseudomonas UTI. Zosyn completed.  12. Hypertension with tachycardia. Lopressor 50 mg every 8 hours.   -controlled 11/10 13. History of tobacco marijuana use. Urine drug screen positive marijuana. Provide counseling with family  53. Syncopal episode last week  -likely vasovagal  -no further issues reported     LOS: 30 days A FACE TO FACE EVALUATION WAS PERFORMED  Meredith Staggers 10/06/2019, 10:01 AM

## 2019-10-06 NOTE — Progress Notes (Addendum)
Physical Therapy Session Note  Patient Details  Name: Matthew Black MRN: [id number] Date of Birth: 1976/03/18  Today's Date: 10/06/2019 PT Individual Time: 1410-1520 PT Individual Time Calculation (min): 70 min   Short Term Goals: Week 4:  PT Short Term Goal 1 (Week 4): STG = LTG due to estimated d/c date.  Skilled Therapeutic Interventions/Progress Updates:   Pt in supine and agreeable to therapy, denies pain. Bed mobility w/ min-mod assist and stand pivot transfer to TIS on pt's R side w/ min assist. Wife present for caregiver education, specifically car transfer. Total assist w/c transport to/from outside of hospital. Discussed best options for car transfer and discussed performing R stand pivot to back L passenger seat and scooting to far R passenger seat to exit car w/ R stand pivot transfer. This would be the safest as pt is most safe performing R stand pivot transfers w/ wife. However instructed and practiced slide board transfer to front passenger seat in case other option is unavailable. Wife performed slide board transfer w/ min assist from therapist to physically bring pt's hips back into seat and verbal/tactile cues for technique. Wife performed stand pivot back to TIS w/o assist from therapist and safely. Wife in agreement w/ plan previously mentioned so he only needs to perform R sided transfers in which she is safe to help him with. Returned to unit and ended session in TIS, all needs in reach.   Therapy Documentation Precautions:  Precautions Precautions: Fall, Other (comment) Precaution Comments: helmet when OOB Required Braces or Orthoses: Other Brace Restrictions Weight Bearing Restrictions: No Vital Signs: Therapy Vitals Temp: 98.1 F (36.7 C) Temp Source: Oral Pulse Rate: 81 Resp: 18 BP: 113/78 Patient Position (if appropriate): Lying Oxygen Therapy SpO2: 100 % O2 Device: Room Air Pain: Pain Assessment Faces Pain Scale: Hurts even more Pain Type: Acute  pain Pain Location: Head Pain Descriptors / Indicators: Aching Pain Frequency: Constant Pain Intervention(s): Medication (See eMAR)  Therapy/Group: Individual Therapy  Jemmie Rhinehart Clent Demark 10/06/2019, 5:24 PM

## 2019-10-06 NOTE — Plan of Care (Signed)
  Problem: RH BOWEL ELIMINATION Goal: RH STG MANAGE BOWEL WITH ASSISTANCE Description: STG Manage Bowel with mod Assistance. Outcome: Not Progressing; incontinence   Problem: RH BLADDER ELIMINATION Goal: RH STG MANAGE BLADDER WITH ASSISTANCE Description: STG Manage Bladder With mod Assistance Outcome: Not Progressing; incontinence   

## 2019-10-07 ENCOUNTER — Inpatient Hospital Stay (HOSPITAL_COMMUNITY): Payer: Medicaid Other | Admitting: Physical Therapy

## 2019-10-07 LAB — BASIC METABOLIC PANEL
Anion gap: 10 (ref 5–15)
BUN: 15 mg/dL (ref 6–20)
CO2: 27 mmol/L (ref 22–32)
Calcium: 9.3 mg/dL (ref 8.9–10.3)
Chloride: 102 mmol/L (ref 98–111)
Creatinine, Ser: 0.77 mg/dL (ref 0.61–1.24)
GFR calc Af Amer: >60 mL/min (ref >60)
GFR calc non Af Amer: >60 mL/min (ref >60)
Glucose, Bld: 95 mg/dL (ref 70–99)
Potassium: 4 mmol/L (ref 3.5–5.1)
Sodium: 139 mmol/L (ref 135–145)

## 2019-10-07 NOTE — Discharge Instructions (Signed)
Inpatient Rehab Discharge Instructions  AADHI SMYSER Discharge date and time: No discharge date for patient encounter.   Activities/Precautions/ Functional Status: Activity: Safety helmet Diet: Regular Wound Care: keep wound clean and dry Functional status:  ___ No restrictions     ___ Walk up steps independently ___ 24/7 supervision/assistance   ___ Walk up steps with assistance ___ Intermittent supervision/assistance  ___ Bathe/dress independently ___ Walk with walker     ___ Bathe/dress with assistance ___ Walk Independently    ___ Shower independently ___ Walk with assistance    ___ Shower with assistance ___ No alcohol     ___ Return to work/school ________     COMMUNITY REFERRALS UPON DISCHARGE:    Home Health:   PT     OT     ST    RN                       Agency:  [facility] Phone: [phone]   Medical Equipment/Items Ordered:  Wheelchair, cushion, hemi walker, hospital bed, commode and transfer board                                                      Agency/Supplier:  [facility] @ [phone]      Special Instructions: No smoking, alcohol or illicit drug use  Follow-up interventional radiology Dr. Verda Cumins to discuss arrangements for retrievable IVC filter  Follow-up Dr. Kathyrn Sheriff of neurosurgery in regards to discussion for cranioplasty   STROKE/TIA DISCHARGE INSTRUCTIONS SMOKING Cigarette smoking nearly doubles your risk of having a stroke & is the single most alterable risk factor  If you smoke or have smoked in the last 12 months, you are advised to quit smoking for your health.  Most of the excess cardiovascular risk related to smoking disappears within a year of stopping.  Ask you doctor about anti-smoking medications  [facility] Quit Line: 1-800-QUIT NOW  Free Smoking Cessation Classes [phone]  CHOLESTEROL Know your levels; limit fat & cholesterol in your diet  Lipid Panel     Component Value Date/Time   CHOL 140 07/20/2019  0158   TRIG 151 (H) 07/23/2019 0115   HDL 40 (L) 07/20/2019 0158   CHOLHDL 3.5 07/20/2019 0158   VLDL 17 07/20/2019 0158   LDLCALC 83 07/20/2019 0158      Many patients benefit from treatment even if their cholesterol is at goal.  Goal: Total Cholesterol (CHOL) less than 160  Goal:  Triglycerides (TRIG) less than 150  Goal:  HDL greater than 40  Goal:  LDL (LDLCALC) less than 100   BLOOD PRESSURE American Stroke Association blood pressure target is less that 120/80 mm/Hg  Your discharge blood pressure is:  BP: (!) 124/92  Monitor your blood pressure  Limit your salt and alcohol intake  Many individuals will require more than one medication for high blood pressure  DIABETES (A1c is a blood sugar average for last 3 months) Goal HGBA1c is under 7% (HBGA1c is blood sugar average for last 3 months)  Diabetes: No known diagnosis of diabetes    Lab Results  Component Value Date   HGBA1C 5.4 07/20/2019     Your HGBA1c can be lowered with medications, healthy diet, and exercise.  Check your blood sugar as directed by your physician  Call  your physician if you experience unexplained or low blood sugars.  PHYSICAL ACTIVITY/REHABILITATION Goal is 30 minutes at least 4 days per week  Activity: Increase activity slowly, Therapies: Physical Therapy: Home Health Return to work:   Activity decreases your risk of heart attack and stroke and makes your heart stronger.  It helps control your weight and blood pressure; helps you relax and can improve your mood.  Participate in a regular exercise program.  Talk with your doctor about the best form of exercise for you (dancing, walking, swimming, cycling).  DIET/WEIGHT Goal is to maintain a healthy weight  Your discharge diet is:  Diet Order            DIET DYS 2 Room service appropriate? No; Fluid consistency: Nectar Thick  Diet effective now              liquids Your height is:  Height: 5\' 11"  (180.3 cm) Your current weight is:  Weight: 88 kg Your Body Mass Index (BMI) is:  BMI (Calculated): 27.07  Following the type of diet specifically designed for you will help prevent another stroke.  Your goal weight range is:    Your goal Body Mass Index (BMI) is 19-24.  Healthy food habits can help reduce 3 risk factors for stroke:  High cholesterol, hypertension, and excess weight.  RESOURCES Stroke/Support Group:  Call [phone]   STROKE EDUCATION PROVIDED/REVIEWED AND GIVEN TO PATIENT Stroke warning signs and symptoms How to activate emergency medical system (call 911). Medications prescribed at discharge. Need for follow-up after discharge. Personal risk factors for stroke. Pneumonia vaccine given:  Flu vaccine given:  My questions have been answered, the writing is legible, and I understand these instructions.  I will adhere to these goals & educational materials that have been provided to me after my discharge from the hospital.      My questions have been answered and I understand these instructions. I will adhere to these goals and the provided educational materials after my discharge from the hospital.  Patient/Caregiver Signature _______________________________ Date __________  Clinician Signature _______________________________________ Date __________  Please bring this form and your medication list with you to all your follow-up doctor's appointments.

## 2019-10-07 NOTE — Progress Notes (Signed)
Social Work Discharge Note   The overall goal for the admission was met for:   Discharge location: Yes - home with wife and parents providing 24/7 support  Length of Stay: Yes - 31 days  Discharge activity level: Yes - mod assist wheelchair level overall  Home/community participation: Yes  Services provided included: MD, RD, PT, OT, SLP, RN, TR, Pharmacy, Neuropsych and SW  Financial Services: Medicaid  Follow-up services arranged: Home Health: RN, PT, OT, ST via [facility], DME: Tilt-in-space wheelchair, cushion, hemi walker, hospital bed, padded drop arm commode and 30" transfer board via [employer] and Patient/Family has no preference for HH/DME agencies  Comments (or additional information):      Contact person is wife, Micael Hampshire @ [phone]  Patient/Family verbalized understanding of follow-up arrangements: Yes  Individual responsible for coordination of the follow-up plan: wife  Confirmed correct DME delivered: Lennart Pall 10/07/2019    [place], [place]

## 2019-10-07 NOTE — Progress Notes (Signed)
[facility] PHYSICAL MEDICINE & REHABILITATION PROGRESS NOTE   Subjective/Complaints:  Getting PICC removed. Struggling with removal of tape!  ROS: Limited due to cognitive/behavioral     Objective:   No results found. No results for input(s): WBC, HGB, HCT, PLT in the last 72 hours. Recent Labs    10/07/19 0421  NA 139  K 4.0  CL 102  CO2 27  GLUCOSE 95  BUN 15  CREATININE 0.77  CALCIUM 9.3    Intake/Output Summary (Last 24 hours) at 10/07/2019 0953 Last data filed at 10/07/2019 0745 Gross per 24 hour  Intake 5754.03 ml  Output 300 ml  Net 5454.03 ml     Physical Exam: Vital Signs Blood pressure 111/73, pulse 74, temperature 98.3 F (36.8 C), temperature source Oral, resp. rate 16, height 5\' 11"  (1.803 m), weight 86.1 kg, SpO2 100 %. Constitutional: No distress . Vital signs reviewed. HEENT: EOMI, oral membranes moist Neck: supple Cardiovascular: RRR without murmur. No JVD    Respiratory: CTA Bilaterally without wheezes or rales. Normal effort    GI: BS +, non-tender, non-distended  Skin:   Appearance consistent with pic 11/13  Psych: more up beat and focused Musc: No edema in extremities.  No tenderness in extremities. Neuro: Alert, initiating more, engaging more. 0/5 LUE and LLE. Left inattention persists, tone tr to 1/4 LUE and LLE--no motor changes RUE/RLE appear to be 5/5 proximal distal--stable  Assessment/Plan: 1. 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  Physiatrist is providing close team supervision and 24 hour management of active medical problems listed below.  Physiatrist and rehab team continue to assess barriers to discharge/monitor patient progress toward functional and medical goals  Care Tool:  Bathing    Body parts bathed by patient: Left arm, Chest, Abdomen, Right upper leg, Left upper leg, Face, Right arm, Front  perineal area   Body parts bathed by helper: Buttocks, Left lower leg, Right lower leg     Bathing assist Assist Level: Moderate Assistance - Patient 50 - 74%     Upper Body Dressing/Undressing Upper body dressing   What is the patient wearing?: Pull over shirt    Upper body assist Assist Level: Minimal Assistance - Patient > 75%    Lower Body Dressing/Undressing Lower body dressing      What is the patient wearing?: Pants, Incontinence brief     Lower body assist Assist for lower body dressing: Maximal Assistance - Patient 25 - 49%     Toileting Toileting Toileting Activity did not occur (Clothing management and hygiene only): N/A (no void or bm)  Toileting assist Assist for toileting: Moderate Assistance - Patient 50 - 74%     Transfers Chair/bed transfer  Transfers assist     Chair/bed transfer assist level: Minimal Assistance - Patient > 75%     Locomotion Ambulation   Ambulation assist   Ambulation activity did not occur: Safety/medical concerns          Walk 10 feet activity   Assist  Walk 10 feet activity did not occur: Safety/medical concerns        Walk 50 feet activity   Assist Walk 50 feet with 2 turns activity did not occur: Safety/medical concerns         Walk 150 feet activity   Assist Walk 150 feet activity did not occur: Safety/medical concerns         Walk 10  feet on uneven surface  activity   Assist Walk 10 feet on uneven surfaces activity did not occur: Safety/medical concerns         Wheelchair     Assist Will patient use wheelchair at discharge?: Yes Type of Wheelchair: Manual Wheelchair activity did not occur: Safety/medical concerns  Wheelchair assist level: Dependent - Patient 0%      Wheelchair 50 feet with 2 turns activity    Assist    Wheelchair 50 feet with 2 turns activity did not occur: Safety/medical concerns   Assist Level: Dependent - Patient 0%   Wheelchair 150 feet  activity     Assist  Wheelchair 150 feet activity did not occur: Safety/medical concerns   Assist Level: Dependent - Patient 0%   Blood pressure 111/73, pulse 74, temperature 98.3 F (36.8 C), temperature source Oral, resp. rate 16, height 5\' 11"  (1.803 m), weight 86.1 kg, SpO2 100 %. 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 Pt appreciative of team's efforts!  -Patient to see Dr. Ranell Patrick in the office for transitional care encounter in 1-2 weeks.   -needs NS f/u in 1-2 weeks for wound as well 2. Antithrombotics: -DVT/anticoagulation:Right peroneal, left popliteal, left posterior tibial and left peroneal DVT. Status post IVC filter 07/21/2019 with plan for retrieval 8 to 12 weeks. Patient currently remains on subcutaneous heparin. No plan for long-term anticoagulation -antiplatelet therapy: N/A 3. Pain Management:Tramadol, tylenol as needed   -kpad for back and neck  -continue scheduled baclofen for spasticity  - continue prn robaxin 4. Mood:Provide motional support -antipsychotic agents: N/A  - reactive depression- celexa increased to 20mg    -appreciate neuropsych eval. Pt and wife found his input helpful. Team to continue to help him work on coping strategies   -pt engaged more spontaneously then I've seen so far while here 5. Neuropsych: This patientis notcapable of making decisions on hisown behalf.  -ritalin initiated for arousal/initiation/depression 6. Skin/Wound Care:Routine skin checks  -continue to observe right scalp wound/granulation. Slowly improving  -needs NS f/u as outpt 7. Fluids/Electrolytes/Nutrition:Routine in and outs.   Hypokalemia-3.7 11/4--->3.8 11/9  -continue supplementing   -prealbumin 26.0 10/19      8. Post stroke dysphagia. upgraded to Dysphagia #2withthin liquids.   -needs cueing/assistance with meals 9. Seizure prophylaxis. Keppra 500 mg twice daily, no seizure activity noted 10. Cerebral abscess/empyema/cerebritis. Cultures no growth to date. Continue IV vancomycin and cefepime 08/20/2019 x 6 to 8 weeks per infectious disease.  Dc abx and PICC today 11. Pseudomonas UTI. Zosyn completed.  12. Hypertension with tachycardia. Lopressor 50 mg every 8 hours.   -controlled 11/10 13. History of tobacco marijuana use. Urine drug screen positive marijuana. Provide counseling with family  32. Syncopal episode last week  -likely vasovagal  -no further issues reported     LOS: 31 days A FACE TO FACE EVALUATION WAS PERFORMED  Meredith Staggers 10/07/2019, 9:53 AM

## 2019-10-07 NOTE — Progress Notes (Signed)
Orthopedic Tech Progress Note Patient Details:  Matthew Black January 07, 1976 [id number] Was called to replace helmet for patient  but WIFE had helmet this whole time. Patient ID: Matthew Black, male   DOB: 1976-03-15, 43 y.o.   MRN: [id number]   Janit Pagan 10/07/2019, 10:27 AM

## 2019-10-07 NOTE — Plan of Care (Signed)
Problem: Consults Goal: RH STROKE PATIENT EDUCATION Description: See Patient Education module for education specifics  10/07/2019 0839 by Sandrea Hammond, LPN Outcome: Adequate for Discharge 10/07/2019 0839 by Sandrea Hammond, LPN Outcome: Adequate for Discharge Goal: Nutrition Consult-if indicated 10/07/2019 0839 by Sandrea Hammond, LPN Outcome: Adequate for Discharge 10/07/2019 0839 by Sandrea Hammond, LPN Outcome: Adequate for Discharge   Problem: RH BOWEL ELIMINATION Goal: RH STG MANAGE BOWEL WITH ASSISTANCE Description: STG Manage Bowel with mod Assistance. 10/07/2019 0839 by Sandrea Hammond, LPN Outcome: Adequate for Discharge 10/07/2019 0839 by Sandrea Hammond, LPN Outcome: Adequate for Discharge Goal: RH STG MANAGE BOWEL W/MEDICATION W/ASSISTANCE Description: STG Manage Bowel with Medication with mod Assistance. 10/07/2019 0839 by Sandrea Hammond, LPN Outcome: Adequate for Discharge 10/07/2019 0839 by Sandrea Hammond, LPN Outcome: Adequate for Discharge   Problem: RH BLADDER ELIMINATION Goal: RH STG MANAGE BLADDER WITH ASSISTANCE Description: STG Manage Bladder With mod Assistance 10/07/2019 0839 by Sandrea Hammond, LPN Outcome: Adequate for Discharge 10/07/2019 0839 by Sandrea Hammond, LPN Outcome: Adequate for Discharge   Problem: RH SKIN INTEGRITY Goal: RH STG SKIN FREE OF INFECTION/BREAKDOWN Description: Skin to remain free from breakdown while on rehab with min assist. 10/07/2019 0839 by Sandrea Hammond, LPN Outcome: Adequate for Discharge 10/07/2019 0839 by Sandrea Hammond, LPN Outcome: Adequate for Discharge Goal: RH STG MAINTAIN SKIN INTEGRITY WITH ASSISTANCE Description: STG Maintain Skin Integrity With min Assistance. 10/07/2019 0839 by Sandrea Hammond, LPN Outcome: Adequate for Discharge 10/07/2019 0839 by Sandrea Hammond, LPN Outcome: Adequate for Discharge Goal: RH STG ABLE TO PERFORM INCISION/WOUND CARE W/ASSISTANCE Description: STG Able To Perform Incision/Wound Care  With min Assistance. 10/07/2019 0839 by Sandrea Hammond, LPN Outcome: Adequate for Discharge 10/07/2019 0839 by Sandrea Hammond, LPN Outcome: Adequate for Discharge   Problem: RH SAFETY Goal: RH STG ADHERE TO SAFETY PRECAUTIONS W/ASSISTANCE/DEVICE Description: STG Adhere to Safety Precautions With mod Assistance and appropriate assistive Device. 10/07/2019 0839 by Sandrea Hammond, LPN Outcome: Adequate for Discharge 10/07/2019 0839 by Sandrea Hammond, LPN Outcome: Adequate for Discharge   Problem: RH COGNITION-NURSING Goal: RH STG USES MEMORY AIDS/STRATEGIES W/ASSIST TO PROBLEM SOLVE Description: STG Uses Memory Aids and Strategies With mod Assistance to Problem Solve. 10/07/2019 0839 by Sandrea Hammond, LPN Outcome: Adequate for Discharge 10/07/2019 0839 by Sandrea Hammond, LPN Outcome: Adequate for Discharge   Problem: RH PAIN MANAGEMENT Goal: RH STG PAIN MANAGED AT OR BELOW PT'S PAIN GOAL Description: <3 on a 0-10 pain scale 10/07/2019 0839 by Sandrea Hammond, LPN Outcome: Adequate for Discharge 10/07/2019 0839 by Sandrea Hammond, LPN Outcome: Adequate for Discharge   Problem: RH KNOWLEDGE DEFICIT Goal: RH STG INCREASE KNOWLEDGE OF HYPERTENSION Description: Patient and caregiver will demonstrate knowledge of HTN medications, dietary restrictions, and follow up care with the MD post discharge with min assist from rehab staff. 10/07/2019 0839 by Sandrea Hammond, LPN Outcome: Adequate for Discharge 10/07/2019 0839 by Sandrea Hammond, LPN Outcome: Adequate for Discharge Goal: RH STG INCREASE KNOWLEDGE OF DYSPHAGIA/FLUID INTAKE Description: Patient and caregiver will demonstrate knowledge of dysphagia diets and thickened liquids, and follow up care with the MD post discharge with min assist from rehab staff.  10/07/2019 0839 by Sandrea Hammond, LPN Outcome: Adequate for Discharge 10/07/2019 0839 by Sandrea Hammond, LPN Outcome: Adequate for Discharge Goal: RH STG INCREASE KNOWLEGDE OF  HYPERLIPIDEMIA Description: Patient and caregiver will demonstrate knowledge of HLD medications, dietary restrictions, and follow up care with the MD post discharge with min assist from rehab staff.  10/07/2019 0839 by Sandrea Hammond, LPN Outcome: Adequate for Discharge 10/07/2019  0839 by Sandrea Hammond, LPN Outcome: Adequate for Discharge Goal: RH STG INCREASE KNOWLEDGE OF STROKE PROPHYLAXIS Description: Patient and caregiver will demonstrate knowledge of Stroke medications, dietary restrictions, and follow up care with the MD post discharge with min assist from rehab staff.  10/07/2019 0839 by Sandrea Hammond, LPN Outcome: Adequate for Discharge 10/07/2019 0839 by Sandrea Hammond, LPN Outcome: Adequate for Discharge

## 2019-10-07 NOTE — Progress Notes (Signed)
Pt is ready for d/c, wife is at bedside with pt belongings. D/C summary done by PA [name]. Amy from Pt will help take pt down to help with a car transfer. No pain or distressed noted from pt.

## 2019-10-10 ENCOUNTER — Telehealth: Payer: Self-pay | Admitting: *Deleted

## 2019-10-10 DIAGNOSIS — I69354 Hemiplegia and hemiparesis following cerebral infarction affecting left non-dominant side: Secondary | ICD-10-CM | POA: Diagnosis not present

## 2019-10-10 NOTE — Telephone Encounter (Signed)
Matthew Black called to let us know [name] will be delayed until tomorrow 10/11/19 due to they were not answering phone calls, so she drove by house and knocked on the door.  [name] said they did not know they were coming (because they were not answering phones) and asked if they could come back tomorrow.  Noted.

## 2019-10-11 ENCOUNTER — Telehealth: Payer: Self-pay | Admitting: Registered Nurse

## 2019-10-11 NOTE — Telephone Encounter (Signed)
TC call placed to wife, Ms. Alease Medina, no answer. Left message to return the call.

## 2019-10-11 NOTE — Telephone Encounter (Signed)
Transitional care call completed. Appointment confirmed. Patient's wife stated she might have to call back to reschedule if she cannot secure help to assist transporting patient out of the home. Address confirmed, New patient packet mailed. I spent 5-10 minutes explaining to patients wife about the role that a PMR physician plays in the course of the patients rehabilitation needs   Transitional Care Questions   Questions for our staff to ask patients on Transitional care 48 hour phone call:   1. Are you/is patient experiencing any problems since coming home? No  Are there any questions regarding any aspect of care?  At this point, No  2. Are there any questions regarding medications administration/dosing? No patient was told by pharmacy to not take baclofen and robaxin concurrently, so patient is only using baclofen. Are meds being taken as prescribed? Yes Patient should review meds with caller to confirm   3. Have there been any falls? No  4. Has Home Health been to the house and/or have they contacted you? Home health mad a surprise visit on Sunday.  Patient was not aware.  Apparently they were calling the patient's phone which had a dead battery. They told the patients wife that they would contact her phone to schedule RN visit.  They have not called  If not, have you tried to contact them? Can we help you contact them?   5. Are bowels and bladder emptying properly? Constipation, bladder okay Are there any unexpected incontinence issues? No If applicable, is patient following bowel/bladder programs?   6. Any fevers, problems with breathing, unexpected pain? No  7. Are there any skin problems or new areas of breakdown?  Wound on skull healing nicely  8. Has the patient/family member arranged specialty MD follow up (ie cardiology/neurology/renal/surgical/etc)?contact Dr. Kathyrn Sheriff in a couple of weeks. Will call ID physician tomorrow Can we help arrange?   9. Does the patient need any other  services or support that we can help arrange? No  10. Are caregivers following through as expected in assisting the patient? Yes  11. Has the patient quit smoking, drinking alcohol, or using drugs as recommended? No smoke, No drink, No illicit drug use

## 2019-10-12 NOTE — Plan of Care (Signed)
Educated by Engineer, civil (consulting) and PA

## 2019-10-12 NOTE — Plan of Care (Signed)
Pt did not met goal.

## 2019-10-12 NOTE — Plan of Care (Signed)
Pt incontinent of bowel.

## 2019-10-13 NOTE — Progress Notes (Signed)
  Patient ID: Matthew Black, male   DOB: 21-Sep-1976, 43 y.o.   MRN: [id number]     Diagnosis codes:  I69/354  Height:      5'11"          Weight:     194 lbs       Patient suffers from  CVA which impairs his ability to perform daily activities like bathing, dressing and mobility in the home.  A  walker will not resolve issue with performing activities of daily living.  A wheelchair will allow patient to safely perform daily activities.    Patient can self propel in the manual, tilt-in-space wheelchair.  Lauraine Rinne, PA-C

## 2019-10-15 DIAGNOSIS — I69391 Dysphagia following cerebral infarction: Secondary | ICD-10-CM | POA: Diagnosis not present

## 2019-10-17 DIAGNOSIS — I69391 Dysphagia following cerebral infarction: Secondary | ICD-10-CM | POA: Diagnosis not present

## 2019-10-18 ENCOUNTER — Encounter
Payer: Medicaid Other | Attending: Physical Medicine and Rehabilitation | Admitting: Physical Medicine and Rehabilitation

## 2019-10-18 DIAGNOSIS — I69391 Dysphagia following cerebral infarction: Secondary | ICD-10-CM | POA: Diagnosis not present

## 2019-10-21 DIAGNOSIS — I69391 Dysphagia following cerebral infarction: Secondary | ICD-10-CM | POA: Diagnosis not present

## 2019-10-25 ENCOUNTER — Other Ambulatory Visit: Payer: Self-pay

## 2019-10-25 ENCOUNTER — Telehealth: Payer: Self-pay | Admitting: *Deleted

## 2019-10-25 ENCOUNTER — Encounter: Payer: Self-pay | Admitting: Physical Medicine and Rehabilitation

## 2019-10-25 ENCOUNTER — Encounter
Payer: Medicaid Other | Attending: Physical Medicine and Rehabilitation | Admitting: Physical Medicine and Rehabilitation

## 2019-10-25 VITALS — BP 102/72 | HR 80 | Temp 97.7°F | Ht 72.0 in | Wt 190.0 lb

## 2019-10-25 DIAGNOSIS — Z87891 Personal history of nicotine dependence: Secondary | ICD-10-CM | POA: Diagnosis not present

## 2019-10-25 DIAGNOSIS — R531 Weakness: Secondary | ICD-10-CM | POA: Diagnosis not present

## 2019-10-25 DIAGNOSIS — R519 Headache, unspecified: Secondary | ICD-10-CM | POA: Diagnosis not present

## 2019-10-25 DIAGNOSIS — G8918 Other acute postprocedural pain: Secondary | ICD-10-CM | POA: Diagnosis not present

## 2019-10-25 DIAGNOSIS — R4584 Anhedonia: Secondary | ICD-10-CM | POA: Insufficient documentation

## 2019-10-25 DIAGNOSIS — I63511 Cerebral infarction due to unspecified occlusion or stenosis of right middle cerebral artery: Secondary | ICD-10-CM | POA: Diagnosis not present

## 2019-10-25 DIAGNOSIS — Z79899 Other long term (current) drug therapy: Secondary | ICD-10-CM | POA: Insufficient documentation

## 2019-10-25 DIAGNOSIS — G8929 Other chronic pain: Secondary | ICD-10-CM

## 2019-10-25 DIAGNOSIS — I69391 Dysphagia following cerebral infarction: Secondary | ICD-10-CM | POA: Diagnosis not present

## 2019-10-25 DIAGNOSIS — R252 Cramp and spasm: Secondary | ICD-10-CM | POA: Insufficient documentation

## 2019-10-25 DIAGNOSIS — Z8673 Personal history of transient ischemic attack (TIA), and cerebral infarction without residual deficits: Secondary | ICD-10-CM | POA: Diagnosis not present

## 2019-10-25 MED ORDER — TOPIRAMATE 25 MG PO TABS: 25.0000 mg | ORAL_TABLET | Freq: Two times a day (BID) | ORAL | 0 refills | Status: DC

## 2019-10-25 NOTE — Telephone Encounter (Signed)
Remind me tomorrow morning. Probably best just doing that by hand

## 2019-10-25 NOTE — Progress Notes (Signed)
Subjective:    Patient ID: Matthew [name], male    DOB: [birth year], 43 y.o.   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 His wife accompanies him and provides most of the history.  Their chief complaint is that Matthew [name], with continued yellow drainage. He describes a headache that is bifrontal and not associated with nausea, vomiting, photophobia, or phonophobia.  She has been giving him Tylenol and Tramadol for the [name], q4 and q6H respectively. She asks whether they may be given close together if needed.   Otherwise, wife and patient have no complaints. Wife notes that patient has little interest in most things. He has been able to verbally communicate with her to a minimal extent and answers yes and no questions by nodding or shaking his head. He has been receiving PT twice per week. During most of the day, he sits in his chair and and does not engage in much activity. His wife says that prior to his stroke, he liked to play basketball with their kids (ages 5 and 65), play with their dog, travel, and relax at home. He has told her that he is happy to be at home.  [name] Inventory Average [name] 6 [name] Right Now 8 My [name] is stabbing  In the last 24 hours, has [name] interfered with the following? General activity 10 Relation with others 9 Enjoyment of life 8 What TIME of day is your [name] at its worst? night Sleep (in general) Poor  [name] is worse with: sitting and standing [name] improves with: medication Relief from Meds: 5  Mobility ability to climb steps?  no do you drive?  no use a wheelchair needs help with transfers  Function disabled: date disabled 2020 I need assistance with the following:  dressing, bathing, toileting, meal prep and shopping  Neuro/Psych weakness trouble walking spasms  Prior Studies Any changes since last  visit?  no  Physicians involved in your care Any changes since last visit?  no   Family History  Problem Relation Age of Onset  . Diabetes Other        GF  . Healthy Mother   . Healthy Father    Social History   Socioeconomic History  . Marital status: Married    Spouse name: Not on file  . Number of children: 2  . Years of education: Not on file  . Highest education level: Not on file  Occupational History  . Occupation: Scientist, research (medical)  . Financial resource strain: Not on file  . Food insecurity    Worry: Not on file    Inability: Not on file  . Transportation needs    Medical: Not on file    Non-medical: Not on file  Tobacco Use  . Smoking status: Former Smoker    Packs/day: 0.00    Quit date: 11/24/2015    Years since quitting: 3.9  . Smokeless tobacco: Never Used  . Tobacco comment:    Substance and Sexual Activity  . Alcohol use: Yes    Comment: occasionally  . Drug use: Never  . Sexual activity: Not on file  Lifestyle  . Physical activity    Days per week: Not on file    Minutes per session: Not on file  . Stress: Not on file  Relationships  . Social  connections    Talks on phone: Not on file    Gets together: Not on file    Attends religious service: Not on file    Active member of club or organization: Not on file    Attends meetings of clubs or organizations: Not on file    Relationship status: Not on file  Other Topics Concern  . Not on file  Social History Narrative   Household-- pt, wife, 2 children   Past Surgical History:  Procedure Laterality Date  . CRANIOTOMY Right 07/19/2019   Procedure: RIGHT HEMI-CRANIECTOMY With implantation of skull flap to abdominal wall;  Surgeon: Consuella Lose, MD;  Location: [place];  Service: Neurosurgery;  Laterality: Right;  . CYST EXCISION N/A 10/08/2016   Procedure: EXCISION OF POSTERIOR NECK CYST;  Surgeon: Clovis Riley, MD;  Location: [facility];  Service: General;  Laterality:  N/A;  . INCISION AND DRAINAGE ABSCESS N/A 09/22/2014   Procedure: INCISION AND DRAINAGE ABSCESS POSTERIOR NECK;  Surgeon: Pedro Earls, MD;  Location: [facility];  Service: General;  Laterality: N/A;  . INCISION AND DRAINAGE ABSCESS N/A 12/20/2015   Procedure: INCISION AND DRAINAGE POSTERIOR NECK MASS;  Surgeon: Armandina Gemma, MD;  Location: [facility];  Service: General;  Laterality: N/A;  . INCISION AND DRAINAGE ABSCESS Left 07/10/2004   middle finger  . IR IVC FILTER PLMT / S&I /IMG GUID/MOD SED  07/21/2019  . IR VENOGRAM RENAL UNI RIGHT  07/21/2019  . MASS EXCISION N/A 07/21/2017   Procedure: EXCISION OF BENIGN NECK LESION WITH LAYERED CLOSURE;  Surgeon: Irene Limbo, MD;  Location: [place];  Service: Plastics;  Laterality: N/A;  . MASS EXCISION N/A 11/10/2017   Procedure: EXCISION BENIGN LESION OF THE NECK WITH LAYERED CLOSURE;  Surgeon: Irene Limbo, MD;  Location: [place];  Service: Plastics;  Laterality: N/A;   Past Medical History:  Diagnosis Date  . 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   There were no vitals taken for this visit.  Opioid Risk Score:   Fall Risk Score:  `1  Depression screen PHQ 2/9  Depression screen [facility] 2/9 09/11/2016 03/11/2016  Decreased Interest 0 0  Down, Depressed, Hopeless 0 0  PHQ - 2 Score 0 0     Review of Systems  Constitutional: Negative.   HENT: Negative.   Eyes: Negative.   Respiratory: Negative.   Cardiovascular: Negative.   Gastrointestinal: Negative.   Endocrine: Negative.   Genitourinary: Negative.   Musculoskeletal: Positive for gait problem and myalgias.  Skin: Negative.   Allergic/Immunologic: Negative.   Neurological: Positive for weakness and headaches.  Hematological: Negative.   Psychiatric/Behavioral: Negative.   All other systems reviewed and are negative.      Objective:   Physical Exam Constitutional: No distress . Vital signs reviewed. HEENT: EOMI, oral membranes moist Neck:  supple Cardiovascular: RRR without murmur. No JVD    Respiratory: CTA Bilaterally without wheezes or rales. Normal effort    GI: BS +, non-tender, non-distended  Psych: Gives me thumbs up in response to questions, and shakes head no at times. At his wife's prompting to speak he said "mask" and pointed to indicate that his mask was causing discomfort.  Musc: No edema in extremities.  No tenderness in extremities. Neuro: 0/5 LUE and LLE. 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 His wife accompanies him and provides most of the history.  Headache -Matthew [name] is experiencing headache possibly related to keloid formation at his surgical [name]. He has minimal drainage in the region, similar to the [name] drainage while he was in the hospital.  -He has neurosurgical follow-up tomorrow where surgical [name] will be evaluated. At this time his wife denies fever or chills. -Advised wife to continue Tylenol and Tramadol for [name]. Advised that she can administer both together.  -Prescribed Topiramate for headache prevention. Hopefully this will allow for decreased use of the Tramadol. -Refills not required at this time. Advised wife to call any time if she needs them.  Anhedonia -Can continue Ritalin 10mg  daily for stimulation. Patient sometimes refuses the medication and I advised wife that this is ok, but he may be more tired and less attentive on those days.  Weakness/spasticity: -Advised continuation of baclofen and home physical therapy. Recommended that wife and both kids participate in daily range of motion exercises for Matthew [name] to prevent contracture formation. Currently most at risk area appears to be left knee flexors.   Thirty minutes of face to face patient care time were  spent during this visit. All questions were encouraged and answered. Follow up with me in 8 weeks via Webex.

## 2019-10-25 NOTE — Telephone Encounter (Signed)
Craige Cotta from [facility] left a message asking if Dr. Naaman Plummer could write an order for a trapeze bar and have it faxed to [place] [phone]

## 2019-10-26 DIAGNOSIS — I69391 Dysphagia following cerebral infarction: Secondary | ICD-10-CM | POA: Diagnosis not present

## 2019-10-26 DIAGNOSIS — M952 Other acquired deformity of head: Secondary | ICD-10-CM | POA: Diagnosis not present

## 2019-10-27 DIAGNOSIS — I69391 Dysphagia following cerebral infarction: Secondary | ICD-10-CM | POA: Diagnosis not present

## 2019-10-28 DIAGNOSIS — I69391 Dysphagia following cerebral infarction: Secondary | ICD-10-CM | POA: Diagnosis not present

## 2019-10-29 DIAGNOSIS — I69391 Dysphagia following cerebral infarction: Secondary | ICD-10-CM | POA: Diagnosis not present

## 2019-10-31 ENCOUNTER — Telehealth: Payer: Self-pay

## 2019-10-31 DIAGNOSIS — I69391 Dysphagia following cerebral infarction: Secondary | ICD-10-CM | POA: Diagnosis not present

## 2019-10-31 DIAGNOSIS — I63511 Cerebral infarction due to unspecified occlusion or stenosis of right middle cerebral artery: Secondary | ICD-10-CM

## 2019-10-31 NOTE — Telephone Encounter (Signed)
THought we added ordered as well.  Regardless, I put in an order for trapeze bar under DME in Epic

## 2019-10-31 NOTE — Telephone Encounter (Signed)
Matthew Black with [facility] called and states he asked for an order 2 weeks ago [place] for the hospital bed - please advise  If order will be placed his number [phone]

## 2019-11-01 NOTE — Telephone Encounter (Addendum)
Hand written order was faxed last week, will re-fax today.   Confirmation page printed and kept for verification.

## 2019-11-02 ENCOUNTER — Telehealth: Payer: Self-pay | Admitting: *Deleted

## 2019-11-02 DIAGNOSIS — M952 Other acquired deformity of head: Secondary | ICD-10-CM | POA: Diagnosis not present

## 2019-11-02 DIAGNOSIS — Z7689 Persons encountering health services in other specified circumstances: Secondary | ICD-10-CM | POA: Diagnosis not present

## 2019-11-02 NOTE — Telephone Encounter (Signed)
Diane, RN, [place] left a message asking for verbal orders to D/C [facility]. Medical record reviewed. Social work note reviewed.  Verbal orders given per office protocol.

## 2019-11-04 DIAGNOSIS — I69391 Dysphagia following cerebral infarction: Secondary | ICD-10-CM | POA: Diagnosis not present

## 2019-11-07 ENCOUNTER — Telehealth: Payer: Self-pay | Admitting: *Deleted

## 2019-11-07 DIAGNOSIS — G060 Intracranial abscess and granuloma: Secondary | ICD-10-CM

## 2019-11-07 DIAGNOSIS — I69354 Hemiplegia and hemiparesis following cerebral infarction affecting left non-dominant side: Secondary | ICD-10-CM | POA: Diagnosis not present

## 2019-11-07 NOTE — Telephone Encounter (Addendum)
Patients spouse left a message stating that patient needs refills for famotidine and celexa. Please refill or advise  Krutika: I will refill both, thank you Yvone Neu!

## 2019-11-08 DIAGNOSIS — I69354 Hemiplegia and hemiparesis following cerebral infarction affecting left non-dominant side: Secondary | ICD-10-CM | POA: Diagnosis not present

## 2019-11-09 ENCOUNTER — Other Ambulatory Visit: Payer: Self-pay | Admitting: Physical Medicine & Rehabilitation

## 2019-11-09 DIAGNOSIS — I69391 Dysphagia following cerebral infarction: Secondary | ICD-10-CM | POA: Diagnosis not present

## 2019-11-10 MED ORDER — FAMOTIDINE 20 MG PO TABS: 20.0000 mg | ORAL_TABLET | Freq: Two times a day (BID) | ORAL | 0 refills | Status: DC

## 2019-11-10 MED ORDER — CITALOPRAM HYDROBROMIDE 20 MG PO TABS: 20.0000 mg | ORAL_TABLET | Freq: Every day | ORAL | 0 refills | Status: DC

## 2019-11-11 ENCOUNTER — Other Ambulatory Visit: Payer: Self-pay | Admitting: Physical Medicine & Rehabilitation

## 2019-11-11 ENCOUNTER — Other Ambulatory Visit: Payer: Self-pay | Admitting: Neurosurgery

## 2019-11-11 ENCOUNTER — Other Ambulatory Visit: Payer: Self-pay | Admitting: *Deleted

## 2019-11-11 DIAGNOSIS — I69391 Dysphagia following cerebral infarction: Secondary | ICD-10-CM | POA: Diagnosis not present

## 2019-11-11 DIAGNOSIS — M952 Other acquired deformity of head: Secondary | ICD-10-CM

## 2019-11-11 MED ORDER — CITALOPRAM HYDROBROMIDE 20 MG PO TABS: 20.0000 mg | ORAL_TABLET | Freq: Every day | ORAL | 0 refills | Status: DC

## 2019-11-11 MED ORDER — FAMOTIDINE 20 MG PO TABS: 20.0000 mg | ORAL_TABLET | Freq: Two times a day (BID) | ORAL | 0 refills | Status: DC

## 2019-11-11 NOTE — Telephone Encounter (Signed)
Refilled celexa and famotadine under Matthew Black due to [facility] issues with Dr Ranell Patrick (waiting for credentialing).

## 2019-11-16 DIAGNOSIS — I69391 Dysphagia following cerebral infarction: Secondary | ICD-10-CM | POA: Diagnosis not present

## 2019-11-24 ENCOUNTER — Ambulatory Visit
Admission: RE | Admit: 2019-11-24 | Discharge: 2019-11-24 | Disposition: A | Discharge status: Home / Self Care | Payer: Medicaid Other | Source: Ambulatory Visit | Attending: Neurosurgery | Admitting: Neurosurgery

## 2019-11-24 DIAGNOSIS — R519 Headache, unspecified: Secondary | ICD-10-CM | POA: Diagnosis not present

## 2019-11-24 DIAGNOSIS — M952 Other acquired deformity of head: Secondary | ICD-10-CM

## 2019-11-28 ENCOUNTER — Telehealth: Payer: Self-pay

## 2019-11-28 NOTE — Telephone Encounter (Signed)
Patients fiance called requesting a call back

## 2019-11-30 ENCOUNTER — Inpatient Hospital Stay: Payer: Medicaid Other | Admitting: Internal Medicine

## 2019-11-30 DIAGNOSIS — I69391 Dysphagia following cerebral infarction: Secondary | ICD-10-CM | POA: Diagnosis not present

## 2019-12-01 ENCOUNTER — Observation Stay (HOSPITAL_COMMUNITY)
Admission: EM | Admit: 2019-12-01 | Discharge: 2019-12-02 | Disposition: A | Discharge status: Home / Self Care | Payer: Medicaid Other | Attending: Internal Medicine | Admitting: Internal Medicine

## 2019-12-01 DIAGNOSIS — N201 Calculus of ureter: Secondary | ICD-10-CM

## 2019-12-01 DIAGNOSIS — I69354 Hemiplegia and hemiparesis following cerebral infarction affecting left non-dominant side: Secondary | ICD-10-CM | POA: Diagnosis not present

## 2019-12-01 DIAGNOSIS — F329 Major depressive disorder, single episode, unspecified: Secondary | ICD-10-CM | POA: Insufficient documentation

## 2019-12-01 DIAGNOSIS — R1031 Right lower quadrant pain: Secondary | ICD-10-CM

## 2019-12-01 DIAGNOSIS — Z20822 Contact with and (suspected) exposure to covid-19: Secondary | ICD-10-CM | POA: Diagnosis not present

## 2019-12-01 DIAGNOSIS — Z87891 Personal history of nicotine dependence: Secondary | ICD-10-CM | POA: Diagnosis not present

## 2019-12-01 DIAGNOSIS — R1084 Generalized abdominal pain: Secondary | ICD-10-CM | POA: Diagnosis not present

## 2019-12-01 DIAGNOSIS — R52 Pain, unspecified: Secondary | ICD-10-CM | POA: Diagnosis not present

## 2019-12-01 DIAGNOSIS — D61818 Other pancytopenia: Secondary | ICD-10-CM | POA: Diagnosis present

## 2019-12-01 DIAGNOSIS — R109 Unspecified abdominal pain: Principal | ICD-10-CM | POA: Insufficient documentation

## 2019-12-01 DIAGNOSIS — R58 Hemorrhage, not elsewhere classified: Secondary | ICD-10-CM | POA: Diagnosis not present

## 2019-12-01 DIAGNOSIS — K611 Rectal abscess: Secondary | ICD-10-CM

## 2019-12-01 DIAGNOSIS — N132 Hydronephrosis with renal and ureteral calculous obstruction: Secondary | ICD-10-CM | POA: Diagnosis not present

## 2019-12-01 DIAGNOSIS — G40909 Epilepsy, unspecified, not intractable, without status epilepticus: Secondary | ICD-10-CM | POA: Insufficient documentation

## 2019-12-01 DIAGNOSIS — E875 Hyperkalemia: Secondary | ICD-10-CM

## 2019-12-01 DIAGNOSIS — K6289 Other specified diseases of anus and rectum: Secondary | ICD-10-CM | POA: Diagnosis present

## 2019-12-01 DIAGNOSIS — I10 Essential (primary) hypertension: Secondary | ICD-10-CM | POA: Insufficient documentation

## 2019-12-01 DIAGNOSIS — N200 Calculus of kidney: Secondary | ICD-10-CM

## 2019-12-01 DIAGNOSIS — D696 Thrombocytopenia, unspecified: Secondary | ICD-10-CM

## 2019-12-01 LAB — CBC WITH DIFFERENTIAL/PLATELET
Abs Immature Granulocytes: 0.02 10*3/uL (ref 0.00–0.07)
Basophils Absolute: 0 10*3/uL (ref 0.0–0.1)
Basophils Relative: 1 %
Eosinophils Absolute: 0 10*3/uL (ref 0.0–0.5)
Eosinophils Relative: 1 %
HCT: 36.7 % — ABNORMAL LOW (ref 39.0–52.0)
Hemoglobin: 12.2 g/dL — ABNORMAL LOW (ref 13.0–17.0)
Immature Granulocytes: 1 %
Lymphocytes Relative: 40 %
Lymphs Abs: 1.5 10*3/uL (ref 0.7–4.0)
MCH: 27.4 pg (ref 26.0–34.0)
MCHC: 33.2 g/dL (ref 30.0–36.0)
MCV: 82.3 fL (ref 80.0–100.0)
Monocytes Absolute: 0.3 10*3/uL (ref 0.1–1.0)
Monocytes Relative: 8 %
Neutro Abs: 1.9 10*3/uL (ref 1.7–7.7)
Neutrophils Relative %: 49 %
Platelets: 28 10*3/uL — CL (ref 150–400)
RBC: 4.46 MIL/uL (ref 4.22–5.81)
RDW: 14 % (ref 11.5–15.5)
WBC: 3.7 10*3/uL — ABNORMAL LOW (ref 4.0–10.5)
nRBC: 0 % (ref 0.0–0.2)

## 2019-12-01 LAB — URINALYSIS, ROUTINE W REFLEX MICROSCOPIC
Bilirubin Urine: NEGATIVE
Glucose, UA: NEGATIVE mg/dL
Hgb urine dipstick: NEGATIVE
Ketones, ur: NEGATIVE mg/dL
Leukocytes,Ua: NEGATIVE
Nitrite: NEGATIVE
Protein, ur: NEGATIVE mg/dL
Specific Gravity, Urine: 1.013 (ref 1.005–1.030)
pH: 7 (ref 5.0–8.0)

## 2019-12-01 LAB — COMPREHENSIVE METABOLIC PANEL
ALT: 19 U/L (ref 0–44)
AST: 41 U/L (ref 15–41)
Albumin: 3.5 g/dL (ref 3.5–5.0)
Alkaline Phosphatase: 108 U/L (ref 38–126)
Anion gap: 7 (ref 5–15)
BUN: 7 mg/dL (ref 6–20)
CO2: 28 mmol/L (ref 22–32)
Calcium: 8.8 mg/dL — ABNORMAL LOW (ref 8.9–10.3)
Chloride: 105 mmol/L (ref 98–111)
Creatinine, Ser: 0.96 mg/dL (ref 0.61–1.24)
GFR calc Af Amer: >60 mL/min (ref >60)
GFR calc non Af Amer: >60 mL/min (ref >60)
Glucose, Bld: 109 mg/dL — ABNORMAL HIGH (ref 70–99)
Potassium: 5.7 mmol/L — ABNORMAL HIGH (ref 3.5–5.1)
Sodium: 140 mmol/L (ref 135–145)
Total Bilirubin: 1.6 mg/dL — ABNORMAL HIGH (ref 0.3–1.2)
Total Protein: 6.4 g/dL — ABNORMAL LOW (ref 6.5–8.1)

## 2019-12-01 LAB — LIPASE, BLOOD: Lipase: 36 U/L (ref 11–51)

## 2019-12-01 MED ORDER — FENTANYL CITRATE (PF) 100 MCG/2ML IJ SOLN
50.0000 ug | Freq: Once | INTRAMUSCULAR | Status: AC
  Administered 2019-12-02: 50 ug via INTRAVENOUS
  Filled 2019-12-01: qty 2

## 2019-12-01 MED ORDER — ONDANSETRON HCL 4 MG/2ML IJ SOLN
4.0000 mg | Freq: Once | INTRAMUSCULAR | Status: AC
  Administered 2019-12-02: 4 mg via INTRAVENOUS
  Filled 2019-12-01: qty 2

## 2019-12-01 MED ORDER — SODIUM CHLORIDE 0.9 % IV SOLN: INTRAVENOUS | Status: DC

## 2019-12-01 NOTE — ED Notes (Signed)
Matthew Black wife [phone] aware pt just arrived, wanted to provide with a number

## 2019-12-01 NOTE — ED Provider Notes (Signed)
[facility] EMERGENCY DEPARTMENT Provider Note   CSN: [id number] Arrival date & time: 12/01/19  2154     History Chief Complaint  Patient presents with  . Abdominal Pain    Matthew Black is a 44 y.o. male.  HPI    Patient presents with concern of abdominal pain. Onset was earlier today, without clear precipitant. Since onset pain has been persistent, sharp, severe, in the right lower abdomen. Pain is an area where the patient had surgery in August of this past year. Patient had a hemorrhagic stroke, requiring craniotomy, and the skull flap is i in this area. Patient has no fever, no chills, no nausea, no vomiting, continues to have bowel movements.  He has new baseline left-sided hemiplegia, this is unchanged.  It is unclear if he has taken any medication for pain relief.  Past Medical History:  Diagnosis Date  . 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    Patient Active Problem List   Diagnosis Date Noted  . Reactive depression   . Wound infection after surgery   . Low grade fever   . Sleep disturbance   . Dysphagia, post-stroke   . Transaminitis   . Right middle cerebral artery stroke ([place]) 09/06/2019  . Cerebral abscess   . Urinary tract infection without hematuria   . Altered mental status   . Primary hypercoagulable state ([place])   . Acute pulmonary embolism without acute cor pulmonale (HCC)   . Deep vein thrombosis (DVT) of non-extremity vein   . Hypokalemia   . FUO (fever of unknown origin)   . Acute blood loss anemia   . SIRS (systemic inflammatory response syndrome) (HCC)   . Leukocytosis   . Endotracheal tube present   . Acute respiratory failure with hypoxemia ([place])   . Stroke (cerebrum) ([place]) 07/19/2019  . Pressure injury of skin 07/19/2019  . Acute CVA (cerebrovascular accident) ([place])   . Encephalopathy   . Dysphagia   . Acute encephalopathy   . Essential hypertension   . Annual physical exam 09/11/2016  . Obesity 03/12/2016  . PCP  NOTES >>>>>>>>>>>>>>>>>. 03/12/2016  . Scalp abscess 12/20/2015  . Neck abscess 12/20/2015  . Pilonidal cyst 02/08/2013    Past Surgical History:  Procedure Laterality Date  . CRANIOTOMY Right 07/19/2019   Procedure: RIGHT HEMI-CRANIECTOMY With implantation of skull flap to abdominal wall;  Surgeon: Consuella Lose, MD;  Location: [place];  Service: Neurosurgery;  Laterality: Right;  . CYST EXCISION N/A 10/08/2016   Procedure: EXCISION OF POSTERIOR NECK CYST;  Surgeon: Clovis Riley, MD;  [facility];  Service: General;  Laterality: N/A;  . INCISION AND DRAINAGE ABSCESS N/A 09/22/2014   Procedure: INCISION AND DRAINAGE ABSCESS POSTERIOR NECK;  Surgeon: Pedro Earls, MD;  [facility];  Service: General;  Laterality: N/A;  . INCISION AND DRAINAGE ABSCESS N/A 12/20/2015   Procedure: INCISION AND DRAINAGE POSTERIOR NECK MASS;  Surgeon: Armandina Gemma, MD;  [facility];  Service: General;  Laterality: N/A;  . INCISION AND DRAINAGE ABSCESS Left 07/10/2004   middle finger  . IR IVC FILTER PLMT / S&I /IMG GUID/MOD SED  07/21/2019  . IR VENOGRAM RENAL UNI RIGHT  07/21/2019  . MASS EXCISION N/A 07/21/2017   Procedure: EXCISION OF BENIGN NECK LESION WITH LAYERED CLOSURE;  Surgeon: Irene Limbo, MD;  Location: [place];  Service: Plastics;  Laterality: N/A;  . MASS EXCISION N/A 11/10/2017   Procedure: EXCISION BENIGN LESION OF THE NECK WITH LAYERED  CLOSURE;  Surgeon: Irene Limbo, MD;  Location: [place];  Service: Plastics;  Laterality: N/A;       Family History  Problem Relation Age of Onset  . Diabetes Other        GF  . Healthy Mother   . Healthy Father     Social History   Tobacco Use  . Smoking status: Former Smoker    Packs/day: 0.00    Quit date: 11/24/2015    Years since quitting: 4.0  . Smokeless tobacco: Never Used  . Tobacco comment:    Substance Use Topics  . Alcohol use: Yes    Comment: occasionally  . Drug  use: Never    Home Medications Prior to Admission medications   Medication Sig Start Date End Date Taking? Authorizing Provider  acetaminophen (TYLENOL) 325 MG tablet Take 2 tablets (650 mg total) by mouth every 4 (four) hours. 10/06/19   Angiulli, Lavon Paganini, PA-C  baclofen (LIORESAL) 10 MG tablet Take 1 tablet (10 mg total) by mouth 2 (two) times daily. 10/06/19   Angiulli, Lavon Paganini, PA-C  citalopram (CELEXA) 20 MG tablet Take 1 tablet (20 mg total) by mouth at bedtime. 11/11/19   Meredith Staggers, MD  famotidine (PEPCID) 20 MG tablet Take 1 tablet (20 mg total) by mouth 2 (two) times daily. 11/11/19   Meredith Staggers, MD  levETIRAcetam (KEPPRA) 500 MG tablet Take 1 tablet (500 mg total) by mouth 2 (two) times daily. 10/06/19   Angiulli, Lavon Paganini, PA-C  methocarbamol (ROBAXIN) 500 MG tablet Take 1 tablet (500 mg total) by mouth every 6 (six) hours as needed for muscle spasms. 10/06/19   Angiulli, Lavon Paganini, PA-C  methylphenidate (RITALIN) 10 MG tablet Take 1 tablet (10 mg total) by mouth 2 (two) times daily with breakfast and lunch. 10/06/19   Angiulli, Lavon Paganini, PA-C  metoprolol tartrate (LOPRESSOR) 50 MG tablet Take 1 tablet (50 mg total) by mouth every 8 (eight) hours. 10/06/19   Angiulli, Lavon Paganini, PA-C  topiramate (TOPAMAX) 25 MG tablet TAKE 1 TABLET BY MOUTH TWICE A DAY 11/14/19   Meredith Staggers, MD  traMADol (ULTRAM) 50 MG tablet Take 1 tablet (50 mg total) by mouth every 6 (six) hours as needed for moderate pain. 10/06/19   Angiulli, Lavon Paganini, PA-C    Allergies    Hydrocodone  Review of Systems   Review of Systems  Constitutional:       Per HPI, otherwise negative  HENT:       Per HPI, otherwise negative  Respiratory:       Per HPI, otherwise negative  Cardiovascular:       Per HPI, otherwise negative  Gastrointestinal: Positive for abdominal pain. Negative for vomiting.  Endocrine:       Negative aside from HPI  Genitourinary:       Neg aside from HPI     Musculoskeletal:       Per HPI, otherwise negative  Skin: Negative.   Neurological: Positive for weakness. Negative for syncope.    Physical Exam Updated Vital Signs BP 126/90 (BP Location: Right Arm)   Pulse 90   Temp 98.4 F (36.9 C) (Oral)   Resp 18   SpO2 97%   Physical Exam Vitals and nursing note reviewed.  Constitutional:      General: He is not in acute distress.    Appearance: He is well-developed.  HENT:     Head: Normocephalic.   Eyes:  Conjunctiva/sclera: Conjunctivae normal.  Cardiovascular:     Rate and Rhythm: Normal rate and regular rhythm.  Pulmonary:     Effort: Pulmonary effort is normal. No respiratory distress.     Breath sounds: No stridor.  Abdominal:     General: There is no distension.    Skin:    General: Skin is warm and dry.  Neurological:     Mental Status: He is alert and oriented to person, place, and time.     Comments: Left-sided hemiplegia.  Speech is brief, clear, appropriate.  Psychiatric:        Cognition and Memory: Cognition is not impaired.     ED Results / Procedures / Treatments   Labs (all labs ordered are listed, but only abnormal results are displayed) Labs Reviewed  COMPREHENSIVE METABOLIC PANEL - Abnormal; Notable for the following components:      Result Value   Potassium 5.7 (*)    Glucose, Bld 109 (*)    Calcium 8.8 (*)    Total Protein 6.4 (*)    Total Bilirubin 1.6 (*)    All other components within normal limits  CBC WITH DIFFERENTIAL/PLATELET - Abnormal; Notable for the following components:   WBC 3.7 (*)    Hemoglobin 12.2 (*)    HCT 36.7 (*)    All other components within normal limits  URINALYSIS, ROUTINE W REFLEX MICROSCOPIC - Abnormal; Notable for the following components:   APPearance HAZY (*)    All other components within normal limits  LIPASE, BLOOD    EKG None  Radiology No results found.  Procedures Procedures (including critical care time)  Medications Ordered in  ED Medications  0.9 %  sodium chloride infusion (has no administration in time range)  fentaNYL (SUBLIMAZE) injection 50 mcg (has no administration in time range)  ondansetron (ZOFRAN) injection 4 mg (has no administration in time range)    ED Course  I have reviewed the triage vital signs and the nursing notes.  Pertinent labs & imaging results that were available during my care of the patient were reviewed by me and considered in my medical decision making (see chart for details).    MDM Rules/Calculators/A&P                     This patient with multiple medical issues, most notably prior hemorrhagic stroke requiring craniotomy, temporary placement of his skull flap into his abdomen, now presents with abdominal pain.  On he is awake, alert, afebrile.  However, given the change in anatomy, his worsening pain, CT scan is indicated. On signout the patient CT scan, labs are pending.  Dr. Roxanne Mins is aware of his presentation, will follow his course.  Final Clinical Impression(s) / ED Diagnoses Final diagnoses:  Right lower quadrant abdominal pain     Carmin Muskrat, MD 12/01/19 2319

## 2019-12-01 NOTE — ED Triage Notes (Signed)
Pt arrives via [facility] from home. He has c/o RLQ abdominal pain started 6 hours ago. No n/v/d or blood in stool. Hx of hemorrhagic (crainectomy with implantation of skull flap to abdominal wall) stroke in August,  Pain is just below the incision site on the abdomen. Febrile 99-102. 144/86, hr 88, r 20,  99% RA

## 2019-12-02 ENCOUNTER — Encounter (HOSPITAL_COMMUNITY): Payer: Self-pay | Admitting: Radiology

## 2019-12-02 ENCOUNTER — Emergency Department (HOSPITAL_COMMUNITY): Payer: Medicaid Other

## 2019-12-02 DIAGNOSIS — N200 Calculus of kidney: Secondary | ICD-10-CM

## 2019-12-02 DIAGNOSIS — E875 Hyperkalemia: Secondary | ICD-10-CM | POA: Diagnosis not present

## 2019-12-02 DIAGNOSIS — D61818 Other pancytopenia: Secondary | ICD-10-CM

## 2019-12-02 DIAGNOSIS — R933 Abnormal findings on diagnostic imaging of other parts of digestive tract: Secondary | ICD-10-CM | POA: Diagnosis not present

## 2019-12-02 DIAGNOSIS — K6289 Other specified diseases of anus and rectum: Secondary | ICD-10-CM | POA: Diagnosis present

## 2019-12-02 DIAGNOSIS — N132 Hydronephrosis with renal and ureteral calculous obstruction: Secondary | ICD-10-CM | POA: Diagnosis not present

## 2019-12-02 DIAGNOSIS — R1031 Right lower quadrant pain: Secondary | ICD-10-CM | POA: Diagnosis not present

## 2019-12-02 LAB — BASIC METABOLIC PANEL
Anion gap: 8 (ref 5–15)
BUN: 5 mg/dL — ABNORMAL LOW (ref 6–20)
CO2: 28 mmol/L (ref 22–32)
Calcium: 8.8 mg/dL — ABNORMAL LOW (ref 8.9–10.3)
Chloride: 105 mmol/L (ref 98–111)
Creatinine, Ser: 0.99 mg/dL (ref 0.61–1.24)
GFR calc Af Amer: >60 mL/min (ref >60)
GFR calc non Af Amer: >60 mL/min (ref >60)
Glucose, Bld: 103 mg/dL — ABNORMAL HIGH (ref 70–99)
Potassium: 3.5 mmol/L (ref 3.5–5.1)
Sodium: 141 mmol/L (ref 135–145)

## 2019-12-02 LAB — CBC
HCT: 39.7 % (ref 39.0–52.0)
Hemoglobin: 12.6 g/dL — ABNORMAL LOW (ref 13.0–17.0)
MCH: 26.7 pg (ref 26.0–34.0)
MCHC: 31.7 g/dL (ref 30.0–36.0)
MCV: 84.1 fL (ref 80.0–100.0)
Platelets: 184 10*3/uL (ref 150–400)
RBC: 4.72 MIL/uL (ref 4.22–5.81)
RDW: 14.1 % (ref 11.5–15.5)
WBC: 9 10*3/uL (ref 4.0–10.5)
nRBC: 0 % (ref 0.0–0.2)

## 2019-12-02 LAB — LACTATE DEHYDROGENASE: LDH: 209 U/L — ABNORMAL HIGH (ref 98–192)

## 2019-12-02 LAB — PROTIME-INR
INR: 1.1 (ref 0.8–1.2)
Prothrombin Time: 14.6 seconds (ref 11.4–15.2)

## 2019-12-02 LAB — HEPATIC FUNCTION PANEL
ALT: 12 U/L (ref 0–44)
AST: 16 U/L (ref 15–41)
Albumin: 3.4 g/dL — ABNORMAL LOW (ref 3.5–5.0)
Alkaline Phosphatase: 106 U/L (ref 38–126)
Bilirubin, Direct: <0.1 mg/dL (ref 0.0–0.2)
Total Bilirubin: 0.4 mg/dL (ref 0.3–1.2)
Total Protein: 6.7 g/dL (ref 6.5–8.1)

## 2019-12-02 LAB — SARS CORONAVIRUS 2 (TAT 6-24 HRS): SARS Coronavirus 2: NEGATIVE

## 2019-12-02 LAB — CBG MONITORING, ED: Glucose-Capillary: 78 mg/dL (ref 70–99)

## 2019-12-02 MED ORDER — ACETAMINOPHEN 650 MG RE SUPP: 650.0000 mg | Freq: Four times a day (QID) | RECTAL | Status: DC | PRN

## 2019-12-02 MED ORDER — FAMOTIDINE 20 MG PO TABS
20.0000 mg | ORAL_TABLET | Freq: Two times a day (BID) | ORAL | Status: DC
  Administered 2019-12-02: 20 mg via ORAL
  Filled 2019-12-02: qty 1

## 2019-12-02 MED ORDER — METOPROLOL TARTRATE 25 MG PO TABS
50.0000 mg | ORAL_TABLET | Freq: Three times a day (TID) | ORAL | Status: DC
  Administered 2019-12-02 (×2): 50 mg via ORAL
  Filled 2019-12-02 (×2): qty 2

## 2019-12-02 MED ORDER — TAMSULOSIN HCL 0.4 MG PO CAPS: 0.4000 mg | ORAL_CAPSULE | Freq: Every day | ORAL | 0 refills | Status: DC

## 2019-12-02 MED ORDER — PIPERACILLIN-TAZOBACTAM 3.375 G IVPB 30 MIN
3.3750 g | Freq: Once | INTRAVENOUS | Status: AC
  Administered 2019-12-02: 3.375 g via INTRAVENOUS
  Filled 2019-12-02: qty 50

## 2019-12-02 MED ORDER — PIPERACILLIN-TAZOBACTAM 3.375 G IVPB
3.3750 g | Freq: Three times a day (TID) | INTRAVENOUS | Status: DC
  Administered 2019-12-02: 3.375 g via INTRAVENOUS
  Filled 2019-12-02: qty 50

## 2019-12-02 MED ORDER — LEVETIRACETAM 500 MG PO TABS
500.0000 mg | ORAL_TABLET | Freq: Two times a day (BID) | ORAL | Status: DC
  Administered 2019-12-02: 500 mg via ORAL
  Filled 2019-12-02: qty 1

## 2019-12-02 MED ORDER — MORPHINE SULFATE (PF) 2 MG/ML IV SOLN
1.0000 mg | INTRAVENOUS | Status: DC | PRN
  Administered 2019-12-02: 09:00:00 1 mg via INTRAVENOUS
  Filled 2019-12-02: qty 1

## 2019-12-02 MED ORDER — ACETAMINOPHEN 325 MG PO TABS
650.0000 mg | ORAL_TABLET | Freq: Four times a day (QID) | ORAL | Status: DC | PRN
  Administered 2019-12-02: 650 mg via ORAL
  Filled 2019-12-02: qty 2

## 2019-12-02 MED ORDER — IOHEXOL 300 MG/ML  SOLN
100.0000 mL | Freq: Once | INTRAMUSCULAR | Status: AC | PRN
  Administered 2019-12-02: 100 mL via INTRAVENOUS

## 2019-12-02 MED ORDER — BACLOFEN 10 MG PO TABS
10.0000 mg | ORAL_TABLET | Freq: Two times a day (BID) | ORAL | Status: DC
  Administered 2019-12-02: 10 mg via ORAL
  Filled 2019-12-02: qty 1

## 2019-12-02 MED ORDER — TAMSULOSIN HCL 0.4 MG PO CAPS
0.4000 mg | ORAL_CAPSULE | Freq: Every day | ORAL | Status: DC
  Administered 2019-12-02: 13:00:00 0.4 mg via ORAL
  Filled 2019-12-02: qty 1

## 2019-12-02 NOTE — H&P (Signed)
TRH H&P    Patient Demographics:    Matthew Black, is a 44 y.o. male  MRN: [id number]  DOB - Apr 01, 1976  Admit Date - 12/01/2019  Referring MD/NP/PA:  Delora Fuel  Outpatient Primary MD for the patient is Colon Branch, MD  Patient coming from:  home  Chief complaint-  RLQ pain   HPI:    Matthew Black  is a 44 y.o. 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  Pt denies fever, chills, cough, cp, palp, sob, n/v, diarrhea, brbpr, black stool , dysuria, hematuria.    In ED T 98.4, P 90 R 18, Bp 126/90  Pox 97% on RA  CT abd/ pelvis IMPRESSION: 1. Moderate right hydroureteronephrosis to the level of a 7 mm calculus in the proximal right ureter. Faint associated perinephric and proximal periureteral stranding and urothelial thickening as well. Could consider urinalysis to exclude superimposed infection. Additional nonobstructive right nephrolithiasis. 2. Normal appendix. 3. Redemonstration of a bone flap within the anterior subcutaneous soft tissues of the right lower quadrant. Near complete resolution of the adjacent fluid collection previously seen on comparison CT.  4. Mild rectal thickening with intersphincteric hazy stranding, and an ill-defined region of phlegmon or possible developing perirectal abscess extending within the ischioanal fossa and along the left gluteal cleft. Recommend correlation with direct  visualization. 5. Patchy ground-glass opacity in the right lung base, possibly atelectasis though is somewhat conspicuous for possible aspiration. 6. Infrarenal vena cava filter in stable position from comparison CT 08/04/2019.  Na 140, K 5.7 Bun 7, creatinine 0.96 Ast 41, Alt 19 Wbc 3.7, Hgb 12.2, Plt 28 Lipase 36 Urinalysis negative  ED spoke with surgery Redmond Pulling) who recommended medicine admission and per ED will follow for ? Perirectal abscess     Review of systems:    In addition to the HPI above,  No Fever-chills, No Headache, No changes with Vision or hearing, No problems swallowing food or Liquids, No Chest pain, Cough or Shortness of Breath,  No Nausea or Vomiting, bowel movements are regular, No Blood in stool or Urine, No dysuria, No new skin rashes or bruises, No new joints pains-aches,  No new weakness, tingling, numbness in any extremity, No recent weight gain or loss, No polyuria, polydypsia or polyphagia, No significant Mental Stressors.  All other systems reviewed and are negative.    Past History of the following :    Past Medical History:  Diagnosis Date  . 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      Past Surgical History:  Procedure Laterality Date  . CRANIOTOMY Right 07/19/2019   Procedure: RIGHT HEMI-CRANIECTOMY  With implantation of skull flap to abdominal wall;  Surgeon: Consuella Lose, MD;  Location: [facility];  Service: Neurosurgery;  Laterality: Right;  . CYST EXCISION N/A 10/08/2016   Procedure: EXCISION OF POSTERIOR NECK CYST;  Surgeon: Clovis Riley, MD;  Location: [facility];  Service: General;  Laterality: N/A;  . INCISION AND DRAINAGE ABSCESS N/A 09/22/2014   Procedure: INCISION AND DRAINAGE ABSCESS POSTERIOR NECK;  Surgeon: Pedro Earls, MD;  Location: [facility];  Service: General;  Laterality: N/A;  . INCISION AND DRAINAGE ABSCESS N/A 12/20/2015   Procedure: INCISION AND DRAINAGE POSTERIOR NECK MASS;  Surgeon: Armandina Gemma, MD;  Location:  [facility];  Service: General;  Laterality: N/A;  . INCISION AND DRAINAGE ABSCESS Left 07/10/2004   middle finger  . IR IVC FILTER PLMT / S&I /IMG GUID/MOD SED  07/21/2019  . IR VENOGRAM RENAL UNI RIGHT  07/21/2019  . MASS EXCISION N/A 07/21/2017   Procedure: EXCISION OF BENIGN NECK LESION WITH LAYERED CLOSURE;  Surgeon: Irene Limbo, MD;  Location: [facility];  Service: Plastics;  Laterality: N/A;  . MASS EXCISION N/A 11/10/2017   Procedure: EXCISION BENIGN LESION OF THE NECK WITH LAYERED CLOSURE;  Surgeon: Irene Limbo, MD;  Location: [facility];  Service: Plastics;  Laterality: N/A;      Social History:      Social History   Tobacco Use  . Smoking status: Former Smoker    Packs/day: 0.00    Quit date: 11/24/2015    Years since quitting: 4.0  . Smokeless tobacco: Never Used  . Tobacco comment:    Substance Use Topics  . Alcohol use: Yes    Comment: occasionally       Family History :     Family History  Problem Relation Age of Onset  . Diabetes Other        GF  . Healthy Mother   . Healthy Father        Home Medications:   Prior to Admission medications   Medication Sig Start Date End Date Taking? Authorizing Provider  acetaminophen (TYLENOL) 325 MG tablet Take 2 tablets (650 mg total) by mouth every 4 (four) hours. 10/06/19   Angiulli, Lavon Paganini, PA-C  baclofen (LIORESAL) 10 MG tablet Take 1 tablet (10 mg total) by mouth 2 (two) times daily. 10/06/19   Angiulli, Lavon Paganini, PA-C  citalopram (CELEXA) 20 MG tablet Take 1 tablet (20 mg total) by mouth at bedtime. 11/11/19   Meredith Staggers, MD  famotidine (PEPCID) 20 MG tablet Take 1 tablet (20 mg total) by mouth 2 (two) times daily. 11/11/19   Meredith Staggers, MD  levETIRAcetam (KEPPRA) 500 MG tablet Take 1 tablet (500 mg total) by mouth 2 (two) times daily. 10/06/19   Angiulli, Lavon Paganini, PA-C  methocarbamol (ROBAXIN) 500 MG tablet Take 1 tablet (500 mg total) by mouth every 6  (six) hours as needed for muscle spasms. 10/06/19   Angiulli, Lavon Paganini, PA-C  methylphenidate (RITALIN) 10 MG tablet Take 1 tablet (10 mg total) by mouth 2 (two) times daily with breakfast and lunch. 10/06/19   Angiulli, Lavon Paganini, PA-C  metoprolol tartrate (LOPRESSOR) 50 MG tablet Take 1 tablet (50 mg total) by mouth every 8 (eight) hours. 10/06/19   Angiulli, Lavon Paganini, PA-C  topiramate (TOPAMAX) 25 MG tablet TAKE 1 TABLET BY MOUTH TWICE A DAY 11/14/19   Meredith Staggers, MD  traMADol (ULTRAM) 50 MG tablet Take 1 tablet (50 mg total)  by mouth every 6 (six) hours as needed for moderate pain. 10/06/19   Angiulli, Lavon Paganini, PA-C     Allergies:     Allergies  Allergen Reactions  . Hydrocodone Nausea Only     Physical Exam:   Vitals  Blood pressure (!) 125/95, pulse 88, temperature 98.4 F (36.9 C), temperature source Oral, resp. rate 18, SpO2 99 %.  1.  General: axoxo3  2. Psychiatric: Slightly depressed  3. Neurologic: cn2-12 intact, reflexes 2+ symmetric, diffuse with no clonus, motor 5/5 in all 4ext  4. HEENMT:  Anicteric, pupils 1.33mm symmetric, direct, consensual , intact Neck: no jvd  5. Respiratory : CTAB  6. Cardiovascular : rrr s1, s2, no m/g/r  7. Gastrointestinal:  Abd: soft, nt, nd, +bs, condom catheter in place  8. Skin:  Ext: no c/c/e, no rash  9.Musculoskeletal:  Good ROM    Data Review:    CBC Recent Labs  Lab 12/01/19 2205  WBC 3.7*  HGB 12.2*  HCT 36.7*  PLT 28*  MCV 82.3  MCH 27.4  MCHC 33.2  RDW 14.0  LYMPHSABS 1.5  MONOABS 0.3  EOSABS 0.0  BASOSABS 0.0   ------------------------------------------------------------------------------------------------------------------  Results for orders placed or performed during the hospital encounter of 12/01/19 (from the past 48 hour(s))  Comprehensive metabolic panel     Status: Abnormal   Collection Time: 12/01/19 10:05 PM  Result Value Ref Range   Sodium 140 135 - 145 mmol/L    Potassium 5.7 (H) 3.5 - 5.1 mmol/L   Chloride 105 98 - 111 mmol/L   CO2 28 22 - 32 mmol/L   Glucose, Bld 109 (H) 70 - 99 mg/dL   BUN 7 6 - 20 mg/dL   Creatinine, Ser 0.96 0.61 - 1.24 mg/dL   Calcium 8.8 (L) 8.9 - 10.3 mg/dL   Total Protein 6.4 (L) 6.5 - 8.1 g/dL   Albumin 3.5 3.5 - 5.0 g/dL   AST 41 15 - 41 U/L   ALT 19 0 - 44 U/L   Alkaline Phosphatase 108 38 - 126 U/L   Total Bilirubin 1.6 (H) 0.3 - 1.2 mg/dL   GFR calc non Af Amer >60 >60 mL/min   GFR calc Af Amer >60 >60 mL/min   Anion gap 7 5 - 15    Comment: Performed at [facility], [address], [place], [place] [address]  Lipase, blood     Status: None   Collection Time: 12/01/19 10:05 PM  Result Value Ref Range   Lipase 36 11 - 51 U/L    Comment: Performed at [facility] [address], [place], [place] [address]  CBC WITH DIFFERENTIAL     Status: Abnormal   Collection Time: 12/01/19 10:05 PM  Result Value Ref Range   WBC 3.7 (L) 4.0 - 10.5 K/uL   RBC 4.46 4.22 - 5.81 MIL/uL   Hemoglobin 12.2 (L) 13.0 - 17.0 g/dL   HCT 36.7 (L) 39.0 - 52.0 %   MCV 82.3 80.0 - 100.0 fL   MCH 27.4 26.0 - 34.0 pg   MCHC 33.2 30.0 - 36.0 g/dL   RDW 14.0 11.5 - 15.5 %   Platelets 28 (LL) 150 - 400 K/uL    Comment: REPEATED TO VERIFY PLATELET COUNT CONFIRMED BY SMEAR THIS CRITICAL RESULT HAS VERIFIED AND BEEN CALLED TO RN G. Lyford ON 01 07 2021 AT 2340, AND HAS BEEN READ BACK.     nRBC 0.0 0.0 -  0.2 %   Neutrophils Relative % 49 %   Neutro Abs 1.9 1.7 - 7.7 K/uL   Lymphocytes Relative 40 %   Lymphs Abs 1.5 0.7 - 4.0 K/uL   Monocytes Relative 8 %   Monocytes Absolute 0.3 0.1 - 1.0 K/uL   Eosinophils Relative 1 %   Eosinophils Absolute 0.0 0.0 - 0.5 K/uL   Basophils Relative 1 %   Basophils Absolute 0.0 0.0 - 0.1 K/uL   Immature Granulocytes 1 %   Abs Immature Granulocytes 0.02 0.00 - 0.07 K/uL    Comment: Performed at [facility] [address] [place], [place] [address]    Urinalysis, Routine w reflex microscopic     Status: Abnormal   Collection Time: 12/01/19 10:05 PM  Result Value Ref Range   Color, Urine YELLOW YELLOW   APPearance HAZY (A) CLEAR   Specific Gravity, Urine 1.013 1.005 - 1.030   pH 7.0 5.0 - 8.0   Glucose, UA NEGATIVE NEGATIVE mg/dL   Hgb urine dipstick NEGATIVE NEGATIVE   Bilirubin Urine NEGATIVE NEGATIVE   Ketones, ur NEGATIVE NEGATIVE mg/dL   Protein, ur NEGATIVE NEGATIVE mg/dL   Nitrite NEGATIVE NEGATIVE   Leukocytes,Ua NEGATIVE NEGATIVE    Comment: Performed at [facility] [address] [place], [place] [address]    Chemistries  Recent Labs  Lab 12/01/19 2205  NA 140  K 5.7*  CL 105  CO2 28  GLUCOSE 109*  BUN 7  CREATININE 0.96  CALCIUM 8.8*  AST 41  ALT 19  ALKPHOS 108  BILITOT 1.6*   ------------------------------------------------------------------------------------------------------------------  ------------------------------------------------------------------------------------------------------------------ GFR: CrCl cannot be calculated (Unknown ideal weight.). Liver Function Tests: Recent Labs  Lab 12/01/19 2205  AST 41  ALT 19  ALKPHOS 108  BILITOT 1.6*  PROT 6.4*  ALBUMIN 3.5   Recent Labs  Lab 12/01/19 2205  LIPASE 36   No results for input(s): AMMONIA in the last 168 hours. Coagulation Profile: No results for input(s): INR, PROTIME in the last 168 hours. Cardiac Enzymes: No results for input(s): CKTOTAL, CKMB, CKMBINDEX, TROPONINI in the last 168 hours. BNP (last 3 results) No results for input(s): PROBNP in the last 8760 hours. HbA1C: No results for input(s): HGBA1C in the last 72 hours. CBG: No results for input(s): GLUCAP in the last 168 hours. Lipid Profile: No results for input(s): CHOL, HDL, LDLCALC, TRIG, CHOLHDL, LDLDIRECT in the last 72 hours. Thyroid Function Tests: No results for input(s): TSH, T4TOTAL, FREET4, T3FREE, THYROIDAB in the last 72 hours. Anemia  Panel: No results for input(s): VITAMINB12, FOLATE, FERRITIN, TIBC, IRON, RETICCTPCT in the last 72 hours.  --------------------------------------------------------------------------------------------------------------- Urine analysis:    Component Value Date/Time   COLORURINE YELLOW 12/01/2019 2205   APPEARANCEUR HAZY (A) 12/01/2019 2205   LABSPEC 1.013 12/01/2019 2205   PHURINE 7.0 12/01/2019 2205   GLUCOSEU NEGATIVE 12/01/2019 2205   HGBUR NEGATIVE 12/01/2019 2205   BILIRUBINUR NEGATIVE 12/01/2019 2205   KETONESUR NEGATIVE 12/01/2019 2205   PROTEINUR NEGATIVE 12/01/2019 2205   NITRITE NEGATIVE 12/01/2019 2205   LEUKOCYTESUR NEGATIVE 12/01/2019 2205      Imaging Results:    CT ABDOMEN PELVIS W CONTRAST  Result Date: 12/02/2019 CLINICAL DATA:  Right lower quadrant for 1 day. Recent hemorrhagic stroke requiring craniotomy EXAM: CT ABDOMEN AND PELVIS WITH CONTRAST TECHNIQUE: Multidetector CT imaging of the abdomen and pelvis was performed using the standard protocol following bolus administration of intravenous contrast. CONTRAST:  165mL OMNIPAQUE IOHEXOL 300 MG/ML  SOLN COMPARISON:  CT 08/04/2019 FINDINGS: Lower chest: Patchy ground-glass opacity is present in the right lung base superimposes some dependent atelectatic changes. Mild airways thickening is present as well. Normal heart size. No pericardial effusion. Hepatobiliary: No focal liver abnormality is seen. No gallstones, gallbladder wall thickening, or biliary dilatation. Pancreas: Unremarkable. No pancreatic ductal dilatation or surrounding inflammatory changes. Spleen: Normal in size without focal abnormality. Adrenals/Urinary Tract: Normal adrenal glands. There is moderate right hydroureteronephrosis to the level of a 7 mm calculus in the proximal right ureter (3/46). Faint associated perinephric and proximal periureteral stranding and urothelial thickening. Additional nonobstructive right nephrolithiasis is seen. Scattered  subcentimeter hypoattenuating foci throughout both kidneys, too small to fully characterize on CT imaging but statistically likely benign. Urinary bladder is largely decompressed at the time of exam and therefore poorly evaluated by CT imaging. Stomach/Bowel: Distal esophagus, stomach and duodenal sweep are unremarkable. No small bowel wall thickening or dilatation. No evidence of obstruction. A normal appendix is visualized. No colonic dilatation or wall thickening. Mild rectal thickening with intersphincteric hazy stranding, detailed in the ?other? section below. Vascular/Lymphatic: The aorta is normal caliber. Infrarenal vena cava filter is noted, in stable position from comparison CT 08/04/2019 Reproductive: Coarse eccentric calcification of the prostate. No concerning abnormalities of the prostate or seminal vesicles. Included external genitalia are free of acute abnormality. Other: Redemonstration of a bone flap within the anterior subcutaneous soft tissues of the right lower quadrant. Near complete resolution of the adjacent fluid collection previously seen on comparison CT. No bowel containing hernias. No free fluid or free air in the abdomen or pelvis. There is a an elongated, rim enhancing collection extending from the region of the external sphincter along the left gluteal cleft soft tissues. Some associated stranding is present in the intersphincteric fat plane. Musculoskeletal: No acute osseous abnormality or suspicious osseous lesion. Craniotomy bone flap in the site tissue in the right lower quadrant type IMPRESSION: 1. Moderate right hydroureteronephrosis to the level of a 7 mm calculus in the proximal right ureter. Faint associated perinephric and proximal periureteral stranding and urothelial thickening as well. Could consider urinalysis to exclude superimposed infection. Additional nonobstructive right nephrolithiasis. 2. Normal appendix. 3. Redemonstration of a bone flap within the anterior  subcutaneous soft tissues of the right lower quadrant. Near complete resolution of the adjacent fluid collection previously seen on comparison CT. 4. Mild rectal thickening with intersphincteric hazy stranding, and an ill-defined region of phlegmon or possible developing perirectal abscess extending within the ischioanal fossa and along the left gluteal cleft. Recommend correlation with direct visualization. 5. Patchy ground-glass opacity in the right lung base, possibly atelectasis though is somewhat conspicuous for possible aspiration. 6. Infrarenal vena cava filter in stable position from comparison CT 08/04/2019. These results were called by telephone at the time of interpretation on 12/02/2019 at 12:53 am to provider Dr. Roxanne Mins, Who verbally acknowledged these results. Electronically Signed   By: Lovena Le M.D.   On: 12/02/2019 00:54     Assessment & Plan:    Principal Problem:   Pancytopenia ([place]) Active Problems:   Nephrolithiasis   Hydronephrosis with renal and ureteral calculus obstruction   Nephrolithiasis w R hydronephrosis NPO Hydrate with ns iv Please consult urology this am  ? Perirectal abscess NPO Zosyn iv pharmacy to dose Surgery consulted by ED, appreciate input  Pancytopenia ? artifact Repeat cbc Check LDH Please consult oncology this am if still thrombocytopenic on repeat  Hyperkalemia Check bmp  Pt's medication list doesn't contain anything that  would increase potassium.   Seizure do Cont Keppra   ADD/ Depression, h/o Stroke, ICH Not taking celexa (per patient choice) Not taking ritalin (per patient choice) Not taking Baclofen  H/o Migraines Not taking topiramate (per patient choice)  Hypertension Cont Lopressor  GI prophylaxis Cont Pepcid   DVT Prophylaxis-   SCDs   AM Labs Ordered, also please review Full Orders  Family Communication: Admission, patients condition and plan of care including tests being ordered have been discussed with the  patient who indicate understanding and agree with the plan and Code Status.  Code Status:  FULL CODE per patient,   Admission status: Observatiion: Based on patients clinical presentation and evaluation of above clinical data, I have made determination that patient meets Observationt criteria at this time.   Time spent in minutes :  70 minutes   Jani Gravel M.D on 12/02/2019 at 4:10 AM

## 2019-12-02 NOTE — Consult Note (Signed)
Matthew Black  Matthew Black October 31, 1976  [id number].    Requesting Provider: Dr. 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 Pt states it hurts intermittently. Pain occurs when he talks or moves. Pain is not constant. No associated symptoms. Patient states no rectal pain. He states some pain in his bottom when he sits in his wheelchair otherwise he has not been having rectal pain. He states he lives with his wife.   ROS:  Review of Systems  Unable to perform ROS: Other   H/o stroke and not sure he has complete capacity.   Family History  Problem Relation Age of Onset  . Diabetes Other        GF  . Healthy Mother   . Healthy Father     Past Medical History:  Diagnosis Date  . 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 CRANIOTOMY Right 07/19/2019   Procedure: RIGHT HEMI-CRANIECTOMY With implantation of skull flap to abdominal wall;  Surgeon: Consuella Lose, MD;  Location: [facility];  Service: Neurosurgery;  Laterality: Right;  . CYST EXCISION N/A 10/08/2016   Procedure: EXCISION OF POSTERIOR NECK CYST;  Surgeon: Clovis Riley, MD;  Location: [facility];  Service: General;  Laterality: N/A;  . INCISION AND DRAINAGE ABSCESS N/A 09/22/2014   Procedure: INCISION AND DRAINAGE ABSCESS POSTERIOR NECK;  Surgeon: Pedro Earls, MD;  Location:  [facility];  Service: General;  Laterality: N/A;  . INCISION AND DRAINAGE ABSCESS N/A 12/20/2015   Procedure: INCISION AND DRAINAGE POSTERIOR NECK MASS;  Surgeon: Armandina Gemma, MD;  Location: [facility];  Service: General;  Laterality: N/A;  . INCISION AND DRAINAGE ABSCESS Left 07/10/2004   middle finger  . IR IVC FILTER PLMT / S&I /IMG GUID/MOD SED  07/21/2019  . IR VENOGRAM RENAL UNI RIGHT  07/21/2019  . MASS EXCISION N/A 07/21/2017   Procedure: EXCISION OF BENIGN NECK LESION WITH LAYERED CLOSURE;  Surgeon: Irene Limbo, MD;  Location: [facility];  Service: Plastics;  Laterality: N/A;  . MASS EXCISION N/A 11/10/2017   Procedure: EXCISION BENIGN LESION OF THE NECK WITH LAYERED CLOSURE;  Surgeon: Irene Limbo, MD;  Location: [place];  Service: Plastics;  Laterality: N/A;    Social History:  reports that he quit smoking about 4 years ago. He smoked 0.00 packs per day. He has never used smokeless tobacco. He reports current alcohol use. He reports that he does not use drugs.  Allergies:  Allergies  Allergen Reactions  . Hydrocodone Nausea Only    (Not in a hospital admission)   Blood pressure 119/83, pulse 95, temperature 98.4 F (36.9 C), temperature source Oral, resp. rate 16, height 6' (1.829 m), weight 86.2 kg, SpO2 100 %.  Physical Exam Vitals and nursing Black reviewed.  Constitutional:      General: He is not in acute distress.    Appearance: He  is well-developed. He is not ill-appearing, toxic-appearing or diaphoretic.  HENT:     Head: Atraumatic.     Comments: craniotomy noted on R side    Mouth/Throat:     Comments: Pt wearing mask Eyes:     General: Lids are normal.     Conjunctiva/sclera: Conjunctivae normal.     Pupils: Pupils are equal, round, and reactive to light.  Cardiovascular:     Rate and Rhythm: Normal rate and regular rhythm.  Pulmonary:     Effort: Pulmonary effort is normal. No respiratory distress.  Abdominal:      General: There is no distension.     Palpations: Abdomen is soft.     Tenderness: There is no abdominal tenderness.     Hernia: No hernia is present.       Comments: Well healed transverse scar noted, bone flap noted in abdomen. No TTP  Genitourinary:    Comments: No erythema, induration or fluctuance noted to perianal region. No TTP.  Musculoskeletal:        General: Normal range of motion.     Cervical back: Full passive range of motion without pain and normal range of motion.  Skin:    General: Skin is warm and dry.  Neurological:     Mental Status: He is alert.     Coordination: Coordination normal.     Comments: L sided weakness 2/2 h/o CVA  Psychiatric:        Behavior: Behavior normal.     Results for orders placed or performed during the hospital encounter of 12/01/19 (from the past 48 hour(s))  Comprehensive metabolic panel     Status: Abnormal   Collection Time: 12/01/19 10:05 PM  Result Value Ref Range   Sodium 140 135 - 145 mmol/L   Potassium 5.7 (H) 3.5 - 5.1 mmol/L   Chloride 105 98 - 111 mmol/L   CO2 28 22 - 32 mmol/L   Glucose, Bld 109 (H) 70 - 99 mg/dL   BUN 7 6 - 20 mg/dL   Creatinine, Ser 0.96 0.61 - 1.24 mg/dL   Calcium 8.8 (L) 8.9 - 10.3 mg/dL   Total Protein 6.4 (L) 6.5 - 8.1 g/dL   Albumin 3.5 3.5 - 5.0 g/dL   AST 41 15 - 41 U/L   ALT 19 0 - 44 U/L   Alkaline Phosphatase 108 38 - 126 U/L   Total Bilirubin 1.6 (H) 0.3 - 1.2 mg/dL   GFR calc non Af Amer >60 >60 mL/min   GFR calc Af Amer >60 >60 mL/min   Anion gap 7 5 - 15    Comment: Performed at [facility], [address] [place], [place] [address]  Lipase, blood     Status: None   Collection Time: 12/01/19 10:05 PM  Result Value Ref Range   Lipase 36 11 - 51 U/L    Comment: Performed at [facility] [address] [place], [place] [address]  CBC WITH DIFFERENTIAL     Status: Abnormal   Collection Time: 12/01/19 10:05 PM  Result Value Ref Range   WBC 3.7 (L) 4.0 - 10.5 K/uL     RBC 4.46 4.22 - 5.81 MIL/uL   Hemoglobin 12.2 (L) 13.0 - 17.0 g/dL   HCT 36.7 (L) 39.0 - 52.0 %   MCV 82.3 80.0 - 100.0 fL   MCH 27.4 26.0 - 34.0 pg   MCHC 33.2 30.0 - 36.0 g/dL   RDW 14.0 11.5 - 15.5 %  Platelets 28 (LL) 150 - 400 K/uL    Comment: REPEATED TO VERIFY PLATELET COUNT CONFIRMED BY SMEAR THIS CRITICAL RESULT HAS VERIFIED AND BEEN CALLED TO RN G. [place] BY [place] ON 01 07 2021 AT 2340, AND HAS BEEN READ BACK.     nRBC 0.0 0.0 - 0.2 %   Neutrophils Relative % 49 %   Neutro Abs 1.9 1.7 - 7.7 K/uL   Lymphocytes Relative 40 %   Lymphs Abs 1.5 0.7 - 4.0 K/uL   Monocytes Relative 8 %   Monocytes Absolute 0.3 0.1 - 1.0 K/uL   Eosinophils Relative 1 %   Eosinophils Absolute 0.0 0.0 - 0.5 K/uL   Basophils Relative 1 %   Basophils Absolute 0.0 0.0 - 0.1 K/uL   Immature Granulocytes 1 %   Abs Immature Granulocytes 0.02 0.00 - 0.07 K/uL    Comment: Performed at [facility] [address] [place], [place] [address]  Urinalysis, Routine w reflex microscopic     Status: Abnormal   Collection Time: 12/01/19 10:05 PM  Result Value Ref Range   Color, Urine YELLOW YELLOW   APPearance HAZY (A) CLEAR   Specific Gravity, Urine 1.013 1.005 - 1.030   pH 7.0 5.0 - 8.0   Glucose, UA NEGATIVE NEGATIVE mg/dL   Hgb urine dipstick NEGATIVE NEGATIVE   Bilirubin Urine NEGATIVE NEGATIVE   Ketones, ur NEGATIVE NEGATIVE mg/dL   Protein, ur NEGATIVE NEGATIVE mg/dL   Nitrite NEGATIVE NEGATIVE   Leukocytes,Ua NEGATIVE NEGATIVE    Comment: Performed at [facility] [address], [place], [place] [address]  Lactate dehydrogenase     Status: Abnormal   Collection Time: 12/02/19  2:12 AM  Result Value Ref Range   LDH 209 (H) 98 - 192 U/L    Comment: Performed at [facility], [place] [address] [place], [place] [address]  Protime-INR     Status: None   Collection Time: 12/02/19  2:12 AM  Result Value Ref Range   Prothrombin Time 14.6 11.4 - 15.2 seconds   INR  1.1 0.8 - 1.2    Comment: (Black) INR goal varies based on device and disease states. Performed at [facility], [place] [address] [place], [place] [id number]   Basic metabolic panel     Status: Abnormal   Collection Time: 12/02/19  2:12 AM  Result Value Ref Range   Sodium 141 135 - 145 mmol/L   Potassium 3.5 3.5 - 5.1 mmol/L    Comment: NO VISIBLE HEMOLYSIS   Chloride 105 98 - 111 mmol/L   CO2 28 22 - 32 mmol/L   Glucose, Bld 103 (H) 70 - 99 mg/dL   BUN 5 (L) 6 - 20 mg/dL   Creatinine, Ser 0.99 0.61 - 1.24 mg/dL   Calcium 8.8 (L) 8.9 - 10.3 mg/dL   GFR calc non Af Amer >60 >60 mL/min   GFR calc Af Amer >60 >60 mL/min   Anion gap 8 5 - 15    Comment: Performed at [facility], [place] [address], [place] [address]  CBC     Status: Abnormal   Collection Time: 12/02/19  6:13 AM  Result Value Ref Range   WBC 9.0 4.0 - 10.5 K/uL   RBC 4.72 4.22 - 5.81 MIL/uL   Hemoglobin 12.6 (L) 13.0 - 17.0 g/dL   HCT 39.7 39.0 - 52.0 %   MCV 84.1 80.0 - 100.0 fL   MCH 26.7  26.0 - 34.0 pg   MCHC 31.7 30.0 - 36.0 g/dL   RDW 14.1 11.5 - 15.5 %   Platelets 184 150 - 400 K/uL    Comment: REPEATED TO VERIFY PREVIOUS SAMPLE WAS QUESTIONABLE RESULTS.    nRBC 0.0 0.0 - 0.2 %    Comment: Performed at [facility], [place] [address] [place], [place] [address]  CBG monitoring, ED     Status: None   Collection Time: 12/02/19  6:20 AM  Result Value Ref Range   Glucose-Capillary 78 70 - 99 mg/dL   Comment 1 Notify RN    Comment 2 Document in Chart    CT ABDOMEN PELVIS W CONTRAST  Result Date: 12/02/2019 CLINICAL DATA:  Right lower quadrant for 1 day. Recent hemorrhagic stroke requiring craniotomy EXAM: CT ABDOMEN AND PELVIS WITH CONTRAST TECHNIQUE: Multidetector CT imaging of the abdomen and pelvis was performed using the standard protocol following bolus administration of intravenous contrast. CONTRAST:  198mL OMNIPAQUE IOHEXOL 300 MG/ML  SOLN COMPARISON:  CT 08/04/2019 FINDINGS: Lower  chest: Patchy ground-glass opacity is present in the right lung base superimposes some dependent atelectatic changes. Mild airways thickening is present as well. Normal heart size. No pericardial effusion. Hepatobiliary: No focal liver abnormality is seen. No gallstones, gallbladder wall thickening, or biliary dilatation. Pancreas: Unremarkable. No pancreatic ductal dilatation or surrounding inflammatory changes. Spleen: Normal in size without focal abnormality. Adrenals/Urinary Tract: Normal adrenal glands. There is moderate right hydroureteronephrosis to the level of a 7 mm calculus in the proximal right ureter (3/46). Faint associated perinephric and proximal periureteral stranding and urothelial thickening. Additional nonobstructive right nephrolithiasis is seen. Scattered subcentimeter hypoattenuating foci throughout both kidneys, too small to fully characterize on CT imaging but statistically likely benign. Urinary bladder is largely decompressed at the time of exam and therefore poorly evaluated by CT imaging. Stomach/Bowel: Distal esophagus, stomach and duodenal sweep are unremarkable. No small bowel wall thickening or dilatation. No evidence of obstruction. A normal appendix is visualized. No colonic dilatation or wall thickening. Mild rectal thickening with intersphincteric hazy stranding, detailed in the ?other? section below. Vascular/Lymphatic: The aorta is normal caliber. Infrarenal vena cava filter is noted, in stable position from comparison CT 08/04/2019 Reproductive: Coarse eccentric calcification of the prostate. No concerning abnormalities of the prostate or seminal vesicles. Included external genitalia are free of acute abnormality. Other: Redemonstration of a bone flap within the anterior subcutaneous soft tissues of the right lower quadrant. Near complete resolution of the adjacent fluid collection previously seen on comparison CT. No bowel containing hernias. No free fluid or free air in the  abdomen or pelvis. There is a an elongated, rim enhancing collection extending from the region of the external sphincter along the left gluteal cleft soft tissues. Some associated stranding is present in the intersphincteric fat plane. Musculoskeletal: No acute osseous abnormality or suspicious osseous lesion. Craniotomy bone flap in the site tissue in the right lower quadrant type IMPRESSION: 1. Moderate right hydroureteronephrosis to the level of a 7 mm calculus in the proximal right ureter. Faint associated perinephric and proximal periureteral stranding and urothelial thickening as well. Could consider urinalysis to exclude superimposed infection. Additional nonobstructive right nephrolithiasis. 2. Normal appendix. 3. Redemonstration of a bone flap within the anterior subcutaneous soft tissues of the right lower quadrant. Near complete resolution of the adjacent fluid collection previously seen on comparison CT. 4. Mild rectal thickening with intersphincteric hazy stranding, and an ill-defined region of phlegmon or possible developing perirectal abscess extending  within the ischioanal fossa and along the left gluteal cleft. Recommend correlation with direct visualization. 5. Patchy ground-glass opacity in the right lung base, possibly atelectasis though is somewhat conspicuous for possible aspiration. 6. Infrarenal vena cava filter in stable position from comparison CT 08/04/2019. These results were called by telephone at the time of interpretation on 12/02/2019 at 12:53 am to provider Dr. Roxanne Mins, Who verbally acknowledged these results. Electronically Signed   By: Lovena Le M.D.   On: 12/02/2019 00:54      Assessment/Plan Principal Problem:   Pancytopenia ([place]) Active Problems:   Nephrolithiasis   Hydronephrosis with renal and ureteral calculus obstruction   Abdominal pain   Hyperkalemia  CT findings concerning for perirectal phlegmon - no concerns for abscess, phlegmon, or infectious process  seen on exam. Pt is nontender. Patient states some tenderness when sitting in his wheelchair. He denies pain of his bottom otherwise.  - no intervention indicated at this time - we will sign off - please page [place] with any further needs for this patient.    Kalman Drape, [facility] 12/02/2019, 8:25 AM Please see amion for pager for the following: M, T, W, & Friday 7:00am - 4:30pm Thursdays 7:00am -11:30am

## 2019-12-02 NOTE — ED Notes (Signed)
MD informed of low PLT count

## 2019-12-02 NOTE — ED Notes (Signed)
Requested assistance from phlebotomy due to pt being a difficult stick and IV not drawing back

## 2019-12-02 NOTE — Progress Notes (Signed)
Pharmacy Antibiotic Note  Matthew Black is a 44 y.o. male admitted on 12/01/2019 with c/o  RLQ pain >> concern for intra-abdominal infection.  Pharmacy has been consulted for Zosyn dosing.  Plan: Zosyn 3.375g IV q8h (4-hour infusion).  Height: 6' (182.9 cm) Weight: 190 lb (86.2 kg) IBW/kg (Calculated) : 77.6  Temp (24hrs), Avg:98.4 F (36.9 C), Min:98.4 F (36.9 C), Max:98.4 F (36.9 C)  Recent Labs  Lab 12/01/19 2205  WBC 3.7*  CREATININE 0.96    Estimated Creatinine Clearance: 110 mL/min (by C-G formula based on SCr of 0.96 mg/dL).    Allergies  Allergen Reactions  . Hydrocodone Nausea Only     Thank you for allowing pharmacy to be a part of this patient's care.  Wynona Neat, PharmD, BCPS  12/02/2019 4:41 AM

## 2019-12-02 NOTE — ED Notes (Signed)
Dr. Ree Kida approved pt for discharge following completion of IV Zosyn. Zosyn to finish at 16:30. Pt's wife updated, transportation will be available at time of discharge.

## 2019-12-02 NOTE — Discharge Summary (Signed)
Physician Discharge Summary  Matthew Black [id number] DOB: 1976/05/21 DOA: 12/01/2019  PCP: Matthew Branch, MD  Admit date: 12/01/2019 Discharge date: 12/02/2019  Time spent: 45 minutes  Recommendations for Outpatient Follow-up:  Patient will be discharged to home.  Patient will need to follow up with primary care provider within one week of discharge, repeat CBC and BMP, and discuss referral to gastroenterology for colonoscopy.  Patient should continue medications as prescribed.  Patient should follow a regular diet.   Discharge Diagnoses:  Nephrolithiasis with right hydronephrosis Questionable perirectal abscess Pancytopenia Hyperkalemia Essential hypertension ADD/depression, history of stroke/ICH History of migraine Seizure disorder  Discharge Condition: Stable  Diet recommendation: Regular  Filed Weights   12/02/19 0413  Weight: 86.2 kg    History of present illness:  On 12/02/2019 by Matthew Black  is a 44 y.o. 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  Pt denies fever, chills, cough, cp, palp, sob, n/v, diarrhea, brbpr, black stool , dysuria, hematuria.    Hospital Course:  Nephrolithiasis with right hydronephrosis -Noted on CT scan -UA unremarkable for infection -Discussed with urology, Dr. Gloriann Loan, who recommended starting Flomax and having patient follow-up in the office in 1 week  Questionable perirectal abscess -CT findings are concerning for perirectal  phlegmon -General surgery consulted and appreciated: No concerns for abscess, phlegmon, or infectious processes noted on examination.  No palpable mass or fullness/fluctuance on digital rectal exam.  No recommendations for antibiotics or surgical intervention from surgical standpoint.  Recommended GI follow-up for colonoscopy.  Pancytopenia -Resolved, question if labs were actually accurate -WBC as well as platelets within normal range  Hyperkalemia -Resolved  Essential hypertension -Continue Lopressor  ADD/depression, history of stroke/ICH -There is a patient is no longer taking Celexa or Ritalin or baclofen  History of migraine -Not taking topiramate  Seizure disorder -Continue Keppra  Procedures: None  Consultations: General surgery Urology via phone, Dr. Gloriann Loan  Discharge Exam: Vitals:   12/02/19 0915 12/02/19 1000  BP: 113/83 109/73  Pulse: 80 79  Resp:    Temp:    SpO2: 98% 98%     General: Well developed, well nourished, NAD, appears stated age  HEENT: NCAT, mucous membranes moist.  Craniotomy noted on the right side  Cardiovascular: S1 S2 auscultated, RRR  Respiratory: Clear to auscultation bilaterally   Abdomen: Soft, nontender, nondistended, + bowel sounds  Extremities: warm dry without cyanosis clubbing or edema  Neuro: AAOx3, nonfocal  Psych: Appropriate mood and affect  Discharge Instructions Discharge Instructions    Diet - low sodium heart healthy   Complete by: As directed    Discharge instructions   Complete by: As directed    Patient will be discharged to home.  Patient will need to follow up with primary care provider within one week of discharge, repeat CBC and BMP, and discuss referral to gastroenterology for colonoscopy.  Patient should continue medications as prescribed.  Patient should follow a regular diet.     Allergies as of 12/02/2019      Reactions  Hydrocodone Nausea Only      Medication List    STOP taking these  medications   citalopram 20 MG tablet Commonly known as: CELEXA   methylphenidate 10 MG tablet Commonly known as: RITALIN     TAKE these medications   acetaminophen 325 MG tablet Commonly known as: TYLENOL Take 2 tablets (650 mg total) by mouth every 4 (four) hours.   baclofen 10 MG tablet Commonly known as: LIORESAL Take 1 tablet (10 mg total) by mouth 2 (two) times daily.   famotidine 20 MG tablet Commonly known as: PEPCID Take 1 tablet (20 mg total) by mouth 2 (two) times daily.   levETIRAcetam 500 MG tablet Commonly known as: KEPPRA Take 1 tablet (500 mg total) by mouth 2 (two) times daily.   metoprolol tartrate 50 MG tablet Commonly known as: LOPRESSOR Take 1 tablet (50 mg total) by mouth every 8 (eight) hours.   tamsulosin 0.4 MG Caps capsule Commonly known as: FLOMAX Take 1 capsule (0.4 mg total) by mouth daily.   traMADol 50 MG tablet Commonly known as: ULTRAM Take 1 tablet (50 mg total) by mouth every 6 (six) hours as needed for moderate pain.      Allergies  Allergen Reactions  . Hydrocodone Nausea Only      The results of significant diagnostics from this hospitalization (including imaging, microbiology, ancillary and laboratory) are listed below for reference.    Significant Diagnostic Studies: CT HEAD WO CONTRAST  Result Date: 11/24/2019 CLINICAL DATA:  Previous right-sided craniotomy. Previous stroke. Headaches. EXAM: CT HEAD WITHOUT CONTRAST TECHNIQUE: Contiguous axial images were obtained from the base of the skull through the vertex without intravenous contrast. COMPARISON:  10/03/2019 FINDINGS: Brain: Extensive previous infarction in the right anterior and middle cerebral artery territories. Encephalomalacia F persisting throughout the region of previous infarction. Reduction in size of an old hematoma in the right temporoparietal junction region with diameter reduced from 4 cm to 2 cm. Slightly increased prominence of in the area in the region of  infarction within the right frontal and frontoparietal junction region which was low-density on the previous study, the appearance of ordinary post infarction encephalomalacia. Today, this area shows increased density inferiorly discrete margins and measures 9.5 cm front to back, 3.1 cm in thickness and 6.5 cm cephalo caudal. This could be a resolving hematoma within the area of infarction. If there is clinical concern regarding infection, cerebritis could not be excluded. Slight lateral bulging of the dura overlying this region compared to the previous study. Ex vacuo enlargement of the right lateral ventricle as seen previously, further increased. No suspicion of obstructive hydrocephalus. No midline shift. No extra-axial intracranial collection. Vascular: No acute vascular finding. Skull: Extensive right craniectomy as noted previously. Sinuses/Orbits: Sinuses are clear.  Orbits negative. Other: Slight reduction in the fluid collection underneath the skin along the posterior margin of the craniectomy compared to the previous study. IMPRESSION: Since the examination of 10/03/2019, evolutionary changes are seen regarding a right temporoparietal junction hematoma, decreased in size from 4 cm to 2 cm. Increasing prominence in the region of frontal and frontoparietal encephalomalacia with increased density and apparent margins, measuring 9.5 x 3.1 x 6.5 cm. This could be a resolving hematoma that subsequently occurred within the infarcted brain since the prior exam. Cannot rule out some sort of inflammatory complication if there is clinical concern regarding intracranial infection. Slight reduction in size of the fluid collection of the scalp at the right posterior craniectomy margin. Electronically Signed  By: Nelson Chimes M.D.   On: 11/24/2019 14:43   CT ABDOMEN PELVIS W CONTRAST  Result Date: 12/02/2019 CLINICAL DATA:  Right lower quadrant for 1 day. Recent hemorrhagic stroke requiring craniotomy EXAM: CT  ABDOMEN AND PELVIS WITH CONTRAST TECHNIQUE: Multidetector CT imaging of the abdomen and pelvis was performed using the standard protocol following bolus administration of intravenous contrast. CONTRAST:  146mL OMNIPAQUE IOHEXOL 300 MG/ML  SOLN COMPARISON:  CT 08/04/2019 FINDINGS: Lower chest: Patchy ground-glass opacity is present in the right lung base superimposes some dependent atelectatic changes. Mild airways thickening is present as well. Normal heart size. No pericardial effusion. Hepatobiliary: No focal liver abnormality is seen. No gallstones, gallbladder wall thickening, or biliary dilatation. Pancreas: Unremarkable. No pancreatic ductal dilatation or surrounding inflammatory changes. Spleen: Normal in size without focal abnormality. Adrenals/Urinary Tract: Normal adrenal glands. There is moderate right hydroureteronephrosis to the level of a 7 mm calculus in the proximal right ureter (3/46). Faint associated perinephric and proximal periureteral stranding and urothelial thickening. Additional nonobstructive right nephrolithiasis is seen. Scattered subcentimeter hypoattenuating foci throughout both kidneys, too small to fully characterize on CT imaging but statistically likely benign. Urinary bladder is largely decompressed at the time of exam and therefore poorly evaluated by CT imaging. Stomach/Bowel: Distal esophagus, stomach and duodenal sweep are unremarkable. No small bowel wall thickening or dilatation. No evidence of obstruction. A normal appendix is visualized. No colonic dilatation or wall thickening. Mild rectal thickening with intersphincteric hazy stranding, detailed in the ?other? section below. Vascular/Lymphatic: The aorta is normal caliber. Infrarenal vena cava filter is noted, in stable position from comparison CT 08/04/2019 Reproductive: Coarse eccentric calcification of the prostate. No concerning abnormalities of the prostate or seminal vesicles. Included external genitalia are free of  acute abnormality. Other: Redemonstration of a bone flap within the anterior subcutaneous soft tissues of the right lower quadrant. Near complete resolution of the adjacent fluid collection previously seen on comparison CT. No bowel containing hernias. No free fluid or free air in the abdomen or pelvis. There is a an elongated, rim enhancing collection extending from the region of the external sphincter along the left gluteal cleft soft tissues. Some associated stranding is present in the intersphincteric fat plane. Musculoskeletal: No acute osseous abnormality or suspicious osseous lesion. Craniotomy bone flap in the site tissue in the right lower quadrant type IMPRESSION: 1. Moderate right hydroureteronephrosis to the level of a 7 mm calculus in the proximal right ureter. Faint associated perinephric and proximal periureteral stranding and urothelial thickening as well. Could consider urinalysis to exclude superimposed infection. Additional nonobstructive right nephrolithiasis. 2. Normal appendix. 3. Redemonstration of a bone flap within the anterior subcutaneous soft tissues of the right lower quadrant. Near complete resolution of the adjacent fluid collection previously seen on comparison CT. 4. Mild rectal thickening with intersphincteric hazy stranding, and an ill-defined region of phlegmon or possible developing perirectal abscess extending within the ischioanal fossa and along the left gluteal cleft. Recommend correlation with direct visualization. 5. Patchy ground-glass opacity in the right lung base, possibly atelectasis though is somewhat conspicuous for possible aspiration. 6. Infrarenal vena cava filter in stable position from comparison CT 08/04/2019. These results were called by telephone at the time of interpretation on 12/02/2019 at 12:53 am to provider Dr. Roxanne Mins, Who verbally acknowledged these results. Electronically Signed   By: Lovena Le M.D.   On: 12/02/2019 00:54    Microbiology: Recent  Results (from the past 240 hour(s))  SARS CORONAVIRUS 2 (TAT 6-24  HRS) Nasopharyngeal Nasopharyngeal Swab     Status: None   Collection Time: 12/02/19  4:06 AM   Specimen: Nasopharyngeal Swab  Result Value Ref Range Status   SARS Coronavirus 2 NEGATIVE NEGATIVE Final    Comment: (NOTE) SARS-CoV-2 target nucleic acids are NOT DETECTED. The SARS-CoV-2 RNA is generally detectable in upper and lower respiratory specimens during the acute phase of infection. Negative results do not preclude SARS-CoV-2 infection, do not rule out co-infections with other pathogens, and should not be used as the sole basis for treatment or other patient management decisions. Negative results must be combined with clinical observations, patient history, and epidemiological information. The expected result is Negative. Fact Sheet for Patients: SugarRoll.be Fact Sheet for Healthcare Providers: https://www.woods-mathews.com/ This test is not yet approved or cleared by the [place] FDA and  has been authorized for detection and/or diagnosis of SARS-CoV-2 by FDA under an Emergency Use Authorization (EUA). This EUA will remain  in effect (meaning this test can be used) for the duration of the COVID-19 declaration under Section 56 4(b)(1) of the Act, 21 U.S.C. section 360bbb-3(b)(1), unless the authorization is terminated or revoked sooner. Performed at [facility], [place] [address], [place], [place] [address]      Labs: Basic Metabolic Panel: Recent Labs  Lab 12/01/19 2205 12/02/19 0212  NA 140 141  K 5.7* 3.5  CL 105 105  CO2 28 28  GLUCOSE 109* 103*  BUN 7 5*  CREATININE 0.96 0.99  CALCIUM 8.8* 8.8*   Liver Function Tests: Recent Labs  Lab 12/01/19 2205  AST 41  ALT 19  ALKPHOS 108  BILITOT 1.6*  PROT 6.4*  ALBUMIN 3.5   Recent Labs  Lab 12/01/19 2205  LIPASE 36   No results for input(s): AMMONIA in the last 168 hours. CBC: Recent  Labs  Lab 12/01/19 2205 12/02/19 0613  WBC 3.7* 9.0  NEUTROABS 1.9  --   HGB 12.2* 12.6*  HCT 36.7* 39.7  MCV 82.3 84.1  PLT 28* 184   Cardiac Enzymes: No results for input(s): CKTOTAL, CKMB, CKMBINDEX, TROPONINI in the last 168 hours. BNP: BNP (last 3 results) No results for input(s): BNP in the last 8760 hours.  ProBNP (last 3 results) No results for input(s): PROBNP in the last 8760 hours.  CBG: Recent Labs  Lab 12/02/19 0620  GLUCAP 78       Signed:  Dereona Kolodny  [facility] 12/02/2019, 12:27 PM

## 2019-12-02 NOTE — ED Provider Notes (Signed)
Care assumed from Dr. Vanita Panda, patient with right-sided abdominal pain was signed out to me pending CT of abdomen and pelvis and labs.  Labs are significant for mild anemia which is unchanged from baseline, and severe thrombocytopenia with platelet count 28,000.  Thrombocytopenia is new.  CT scan shows a 7 mm calculus in the right mid ureter which accounts for his abdominal pain.  Also noted possible left perirectal abscess.  I went back to evaluate the patient and indeed there is a left-sided perirectal abscess.  Because of thrombocytopenia, I am reluctant to do incision and drainage in the ED.  He also needs evaluation for cause of his thrombocytopenia.  Case is discussed with Dr. Maudie Mercury of [facility] who agrees to admit the patient, and Dr. Redmond Pulling of general surgery who agrees to see the patient in consultation.   Delora Fuel, MD [id number] [phone]

## 2019-12-02 NOTE — ED Notes (Signed)
..  Patient verbalizes understanding of discharge instructions. Opportunity for questioning and answers were provided. Armband removed by staff, pt discharged from ED. Discharge instructions also provided to patient's wife. Understanding verbalized.

## 2019-12-02 NOTE — ED Notes (Signed)
Pt transported to CT ?

## 2019-12-09 DIAGNOSIS — I69391 Dysphagia following cerebral infarction: Secondary | ICD-10-CM | POA: Diagnosis not present

## 2019-12-12 DIAGNOSIS — I69391 Dysphagia following cerebral infarction: Secondary | ICD-10-CM | POA: Diagnosis not present

## 2019-12-13 ENCOUNTER — Other Ambulatory Visit: Payer: Self-pay | Admitting: Physical Medicine & Rehabilitation

## 2019-12-13 DIAGNOSIS — N132 Hydronephrosis with renal and ureteral calculous obstruction: Secondary | ICD-10-CM | POA: Diagnosis not present

## 2019-12-13 DIAGNOSIS — N201 Calculus of ureter: Secondary | ICD-10-CM | POA: Diagnosis not present

## 2019-12-14 ENCOUNTER — Other Ambulatory Visit: Payer: Self-pay

## 2019-12-14 ENCOUNTER — Encounter: Payer: Self-pay | Admitting: *Deleted

## 2019-12-14 ENCOUNTER — Ambulatory Visit
Admission: RE | Admit: 2019-12-14 | Discharge: 2019-12-14 | Disposition: A | Discharge status: Home / Self Care | Payer: Medicaid Other | Source: Ambulatory Visit | Attending: Physician Assistant | Admitting: Physician Assistant

## 2019-12-14 DIAGNOSIS — I82409 Acute embolism and thrombosis of unspecified deep veins of unspecified lower extremity: Secondary | ICD-10-CM

## 2019-12-14 DIAGNOSIS — I639 Cerebral infarction, unspecified: Secondary | ICD-10-CM

## 2019-12-14 DIAGNOSIS — I63331 Cerebral infarction due to thrombosis of right posterior cerebral artery: Secondary | ICD-10-CM | POA: Diagnosis not present

## 2019-12-14 HISTORY — PX: IR RADIOLOGIST EVAL & MGMT: IMG5224

## 2019-12-14 NOTE — Progress Notes (Addendum)
Chief Complaint: Patient was consulted remotely today (TeleHealth) for follow-up of IVC filter.  Referring Physician(s): Kathlene November MD  History of Present Illness: BENEDETTO VIDAURRI is a 44 y.o. male with history of right cerebral infarct requiring right hemicraniectomy and implantation the skull flap into the abdominal wall.  In addition, there was hemorrhagic conversion with a hematoma with a cerebral abscess/empyema requiring an aspiration.  Shortly after the CVA, patient was noted to have bilateral lower extremity DVT on 07/21/2019 demonstrating DVT in the right peroneal veins, left popliteal vein, left posterior tibial veins and left peroneal veins.  Patient was not a candidate for anticoagulation and underwent IVC filter placement on 07/21/2019.  CTA chest demonstrated nonocclusive pulmonary emboli in the left posterior lower lobe om 08/01/2019.  Patient had a follow-up lower extremity venous duplex on 08/02/2019 that demonstrated right peroneal vein DVT and evidence for progressive DVT in the left lower extremity.  There was DVT in the left common femoral vein, left femoral vein, proximal left profunda vein, left popliteal vein, left calf veins and extending into the left iliac vein and IVC.  Patient has had a long recovery from his CVA.  Patient is currently at home and wheelchair bound.  I spoke to the patient and his wife on the telephone.  Patient was recently admitted at [facility] on 12/02/2019 for right lower quadrant pain.  CT study from 12/01/2018 demonstrated moderate right hydroureteronephrosis due to a stone in the proximal right ureter.  According to the patient's wife, patient is going to undergo a treatment for the right ureter stone by Dr. Gloriann Loan.  Past Medical History:  Diagnosis Date  . 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 CRANIOTOMY Right 07/19/2019   Procedure: RIGHT HEMI-CRANIECTOMY With implantation of skull flap to abdominal  wall;  Surgeon: Consuella Lose, MD;  Location: [place];  Service: Neurosurgery;  Laterality: Right;  . CYST EXCISION N/A 10/08/2016   Procedure: EXCISION OF POSTERIOR NECK CYST;  Surgeon: Clovis Riley, MD;  Location: [facility];  Service: General;  Laterality: N/A;  . INCISION AND DRAINAGE ABSCESS N/A 09/22/2014   Procedure: INCISION AND DRAINAGE ABSCESS POSTERIOR NECK;  Surgeon: Pedro Earls, MD;  Location: [facility];  Service: General;  Laterality: N/A;  . INCISION AND DRAINAGE ABSCESS N/A 12/20/2015   Procedure: INCISION AND DRAINAGE POSTERIOR NECK MASS;  Surgeon: Armandina Gemma, MD;  Location: [facility];  Service: General;  Laterality: N/A;  . INCISION AND DRAINAGE ABSCESS Left 07/10/2004   middle finger  . IR IVC FILTER PLMT / S&I /IMG GUID/MOD SED  07/21/2019  . IR VENOGRAM RENAL UNI RIGHT  07/21/2019  . MASS EXCISION N/A 07/21/2017   Procedure: EXCISION OF BENIGN NECK LESION WITH LAYERED CLOSURE;  Surgeon: Irene Limbo, MD;  Location: [place];  Service: Plastics;  Laterality: N/A;  . MASS EXCISION N/A 11/10/2017   Procedure: EXCISION BENIGN LESION OF THE NECK WITH LAYERED CLOSURE;  Surgeon: Irene Limbo, MD;  Location: [place];  Service: Plastics;  Laterality: N/A;    Allergies: Hydrocodone  Medications: Prior to Admission medications   Medication Sig Start Date End Date Taking? Authorizing Provider  acetaminophen (TYLENOL) 325 MG tablet Take 2 tablets (650 mg total) by mouth every 4 (four) hours. 10/06/19   Angiulli, Lavon Paganini, PA-C  baclofen (LIORESAL) 10 MG tablet Take 1 tablet (10 mg total) by mouth 2 (two) times daily. 10/06/19  Angiulli, Lavon Paganini, PA-C  famotidine (PEPCID) 20 MG tablet Take 1 tablet (20 mg total) by mouth 2 (two) times daily. 11/11/19   Meredith Staggers, MD  levETIRAcetam (KEPPRA) 500 MG tablet Take 1 tablet (500 mg total) by mouth 2 (two) times daily. 10/06/19   Angiulli, Lavon Paganini, PA-C  metoprolol tartrate (LOPRESSOR)  50 MG tablet Take 1 tablet (50 mg total) by mouth every 8 (eight) hours. 10/06/19   Angiulli, Lavon Paganini, PA-C  tamsulosin (FLOMAX) 0.4 MG CAPS capsule Take 1 capsule (0.4 mg total) by mouth daily. 12/02/19   Mikhail, Velta Addison, DO  traMADol (ULTRAM) 50 MG tablet Take 1 tablet (50 mg total) by mouth every 6 (six) hours as needed for moderate pain. 10/06/19   Angiulli, Lavon Paganini, PA-C     Family History  Problem Relation Age of Onset  . Diabetes Other        GF  . Healthy Mother   . Healthy Father     Social History   Socioeconomic History  . Marital status: Married    Spouse name: Not on file  . Number of children: 2  . Years of education: Not on file  . Highest education level: Not on file  Occupational History  . Occupation: Clinical biochemist   Tobacco Use  . Smoking status: Former Smoker    Packs/day: 0.00    Quit date: 11/24/2015    Years since quitting: 4.0  . Smokeless tobacco: Never Used  . Tobacco comment:    Substance and Sexual Activity  . Alcohol use: Yes    Comment: occasionally  . Drug use: Never  . Sexual activity: Not on file  Other Topics Concern  . Not on file  Social History Narrative   Household-- pt, wife, 2 children   Social Determinants of Health   Financial Resource Strain:   . Difficulty of Paying Living Expenses: Not on file  Food Insecurity:   . Worried About Charity fundraiser in the Last Year: Not on file  . Ran Out of Food in the Last Year: Not on file  Transportation Needs:   . Lack of Transportation (Medical): Not on file  . Lack of Transportation (Non-Medical): Not on file  Physical Activity:   . Days of Exercise per Week: Not on file  . Minutes of Exercise per Session: Not on file  Stress:   . Feeling of Stress : Not on file  Social Connections:   . Frequency of Communication with Friends and Family: Not on file  . Frequency of Social Gatherings with Friends and Family: Not on file  . Attends Religious Services: Not on file  .  Active Member of Clubs or Organizations: Not on file  . Attends Archivist Meetings: Not on file  . Marital Status: Not on file      Physical Exam No direct physical exam was performed   Vital Signs: There were no vitals taken for this visit.  Imaging: CT HEAD WO CONTRAST  Result Date: 11/24/2019 CLINICAL DATA:  Previous right-sided craniotomy. Previous stroke. Headaches. EXAM: CT HEAD WITHOUT CONTRAST TECHNIQUE: Contiguous axial images were obtained from the base of the skull through the vertex without intravenous contrast. COMPARISON:  10/03/2019 FINDINGS: Brain: Extensive previous infarction in the right anterior and middle cerebral artery territories. Encephalomalacia F persisting throughout the region of previous infarction. Reduction in size of an old hematoma in the right temporoparietal junction region with diameter reduced from 4 cm to 2 cm. Slightly  increased prominence of in the area in the region of infarction within the right frontal and frontoparietal junction region which was low-density on the previous study, the appearance of ordinary post infarction encephalomalacia. Today, this area shows increased density inferiorly discrete margins and measures 9.5 cm front to back, 3.1 cm in thickness and 6.5 cm cephalo caudal. This could be a resolving hematoma within the area of infarction. If there is clinical concern regarding infection, cerebritis could not be excluded. Slight lateral bulging of the dura overlying this region compared to the previous study. Ex vacuo enlargement of the right lateral ventricle as seen previously, further increased. No suspicion of obstructive hydrocephalus. No midline shift. No extra-axial intracranial collection. Vascular: No acute vascular finding. Skull: Extensive right craniectomy as noted previously. Sinuses/Orbits: Sinuses are clear.  Orbits negative. Other: Slight reduction in the fluid collection underneath the skin along the posterior  margin of the craniectomy compared to the previous study. IMPRESSION: Since the examination of 10/03/2019, evolutionary changes are seen regarding a right temporoparietal junction hematoma, decreased in size from 4 cm to 2 cm. Increasing prominence in the region of frontal and frontoparietal encephalomalacia with increased density and apparent margins, measuring 9.5 x 3.1 x 6.5 cm. This could be a resolving hematoma that subsequently occurred within the infarcted brain since the prior exam. Cannot rule out some sort of inflammatory complication if there is clinical concern regarding intracranial infection. Slight reduction in size of the fluid collection of the scalp at the right posterior craniectomy margin. Electronically Signed   By: Nelson Chimes M.D.   On: 11/24/2019 14:43   CT ABDOMEN PELVIS W CONTRAST  Result Date: 12/02/2019 CLINICAL DATA:  Right lower quadrant for 1 day. Recent hemorrhagic stroke requiring craniotomy EXAM: CT ABDOMEN AND PELVIS WITH CONTRAST TECHNIQUE: Multidetector CT imaging of the abdomen and pelvis was performed using the standard protocol following bolus administration of intravenous contrast. CONTRAST:  158mL OMNIPAQUE IOHEXOL 300 MG/ML  SOLN COMPARISON:  CT 08/04/2019 FINDINGS: Lower chest: Patchy ground-glass opacity is present in the right lung base superimposes some dependent atelectatic changes. Mild airways thickening is present as well. Normal heart size. No pericardial effusion. Hepatobiliary: No focal liver abnormality is seen. No gallstones, gallbladder wall thickening, or biliary dilatation. Pancreas: Unremarkable. No pancreatic ductal dilatation or surrounding inflammatory changes. Spleen: Normal in size without focal abnormality. Adrenals/Urinary Tract: Normal adrenal glands. There is moderate right hydroureteronephrosis to the level of a 7 mm calculus in the proximal right ureter (3/46). Faint associated perinephric and proximal periureteral stranding and urothelial  thickening. Additional nonobstructive right nephrolithiasis is seen. Scattered subcentimeter hypoattenuating foci throughout both kidneys, too small to fully characterize on CT imaging but statistically likely benign. Urinary bladder is largely decompressed at the time of exam and therefore poorly evaluated by CT imaging. Stomach/Bowel: Distal esophagus, stomach and duodenal sweep are unremarkable. No small bowel wall thickening or dilatation. No evidence of obstruction. A normal appendix is visualized. No colonic dilatation or wall thickening. Mild rectal thickening with intersphincteric hazy stranding, detailed in the ?other? section below. Vascular/Lymphatic: The aorta is normal caliber. Infrarenal vena cava filter is noted, in stable position from comparison CT 08/04/2019 Reproductive: Coarse eccentric calcification of the prostate. No concerning abnormalities of the prostate or seminal vesicles. Included external genitalia are free of acute abnormality. Other: Redemonstration of a bone flap within the anterior subcutaneous soft tissues of the right lower quadrant. Near complete resolution of the adjacent fluid collection previously seen on comparison CT. No bowel containing  hernias. No free fluid or free air in the abdomen or pelvis. There is a an elongated, rim enhancing collection extending from the region of the external sphincter along the left gluteal cleft soft tissues. Some associated stranding is present in the intersphincteric fat plane. Musculoskeletal: No acute osseous abnormality or suspicious osseous lesion. Craniotomy bone flap in the site tissue in the right lower quadrant type IMPRESSION: 1. Moderate right hydroureteronephrosis to the level of a 7 mm calculus in the proximal right ureter. Faint associated perinephric and proximal periureteral stranding and urothelial thickening as well. Could consider urinalysis to exclude superimposed infection. Additional nonobstructive right nephrolithiasis.  2. Normal appendix. 3. Redemonstration of a bone flap within the anterior subcutaneous soft tissues of the right lower quadrant. Near complete resolution of the adjacent fluid collection previously seen on comparison CT. 4. Mild rectal thickening with intersphincteric hazy stranding, and an ill-defined region of phlegmon or possible developing perirectal abscess extending within the ischioanal fossa and along the left gluteal cleft. Recommend correlation with direct visualization. 5. Patchy ground-glass opacity in the right lung base, possibly atelectasis though is somewhat conspicuous for possible aspiration. 6. Infrarenal vena cava filter in stable position from comparison CT 08/04/2019. These results were called by telephone at the time of interpretation on 12/02/2019 at 12:53 am to provider Dr. Roxanne Mins, Who verbally acknowledged these results. Electronically Signed   By: Lovena Le M.D.   On: 12/02/2019 00:54    Labs:  CBC: Recent Labs    09/26/19 1057 10/03/19 0449 12/01/19 2205 12/02/19 0613  WBC 6.0 5.7 3.7* 9.0  HGB 12.3* 11.8* 12.2* 12.6*  HCT 38.4* 37.2* 36.7* 39.7  PLT 245 255 28* 184    COAGS: Recent Labs    12/02/19 0212  INR 1.1    BMP: Recent Labs    10/04/19 0527 10/07/19 0421 12/01/19 2205 12/02/19 0212  NA 139 139 140 141  K 3.6 4.0 5.7* 3.5  CL 103 102 105 105  CO2 25 27 28 28   GLUCOSE 86 95 109* 103*  BUN 7 15 7  5*  CALCIUM 9.5 9.3 8.8* 8.8*  CREATININE 0.68 0.77 0.96 0.99  GFRNONAA >60 >60 >60 >60  GFRAA >60 >60 >60 >60    LIVER FUNCTION TESTS: Recent Labs    09/12/19 0417 09/26/19 1057 12/01/19 2205 12/02/19 0212  BILITOT 0.6 0.3 1.6* 0.4  AST 33 17 41 16  ALT 76* 38 19 12  ALKPHOS 107 67 108 106  PROT 6.8 5.4* 6.4* 6.7  ALBUMIN 3.2* 2.6* 3.5 3.4*    TUMOR MARKERS: No results for input(s): AFPTM, CEA, CA199, CHROMGRNA in the last 8760 hours.  Assessment and Plan:  44 year old male with history of a significant CVA requiring right  hemicraniectomy and prolonged hospitalization.  In addition, the patient developed bilateral lower extremity DVT and evidence for a least a small pulmonary embolism.  IVC filter was placed on 07/21/2019 because the patient was not an anticoagulation candidate at that time.  Patient had propagation of the left lower extremity DVT on a follow-up venous duplex examination on 08/02/2019.  Patient was recently admitted to the hospital for right abdominal pain and found to have right hydronephrosis due to a proximal right ureter stone.  Discussed IVC filter management with the patient and wife.  At this time, the patient is doing outpatient physical therapy.  It sounds like the patient is slowly progressing but he still wheelchair-bound.  Patient is not on anticoagulation.  I presume that the DVT was  associated with the massive CVA and hospitalization.  Patient has no history of venous thromboembolic disease prior to his CVA.  Discussed the risks and benefits of keeping an IVC filter.  Explained that long-term problems with IVC filter include caval thrombosis, filter fracture, filter migration and potential increased risk of future DVT.  Explained that the IVC filter does not have a time limit and that we could remove the filter in 6 months or even years from now.  I reviewed the recent CT from 12/02/2019 and the IVC filter appears to be well-positioned.  I suspect there was some caval thrombus below the filter on the exam from 08/04/2019 along with left iliac vein DVT.  I do not see any significant thrombus in the IVC or iliac veins on the most recent study but there may be a small amount of thrombus near the apex of the filter.  Because the patient is still rather immobile, I suggest that we keep the IVC filter for now.  We will schedule the patient for follow-up visit in 6 months and assess whether the patient would be a good candidate for IVC retrieval at that time.  Patient and wife are comfortable with this  plan.  Thank you for this interesting consult.  I greatly enjoyed meeting SHANDY PORRES and look forward to participating in their care.  A copy of this report was sent to the requesting provider on this date.  Electronically Signed: Burman Riis 12/14/2019, 10:45 AM   I spent a total of    15 Minutes in remote  clinical consultation, greater than 50% of which was counseling/coordinating care for IVC filter management.    Visit type: Audio only (telephone). Audio (no video) only due to patient preference. Alternative for in-person consultation at [facility], [address], [place], [place]. This visit type was conducted due to national recommendations for restrictions regarding the COVID-19 Pandemic (e.g. social distancing).  This format is felt to be most appropriate for this patient at this time.  All issues noted in this document were discussed and addressed.  Patient ID: ANTHONI CHIRDON, male   DOB: 1976/10/16, 44 y.o.   MRN: [id number]

## 2019-12-15 ENCOUNTER — Ambulatory Visit: Payer: Medicaid Other | Admitting: Physical Therapy

## 2019-12-16 ENCOUNTER — Other Ambulatory Visit: Payer: Self-pay

## 2019-12-16 ENCOUNTER — Other Ambulatory Visit: Payer: Self-pay | Admitting: *Deleted

## 2019-12-16 ENCOUNTER — Ambulatory Visit: Payer: Medicaid Other | Attending: Physician Assistant

## 2019-12-16 VITALS — BP 102/74 | HR 77

## 2019-12-16 DIAGNOSIS — R209 Unspecified disturbances of skin sensation: Secondary | ICD-10-CM | POA: Insufficient documentation

## 2019-12-16 DIAGNOSIS — R2689 Other abnormalities of gait and mobility: Secondary | ICD-10-CM | POA: Diagnosis not present

## 2019-12-16 DIAGNOSIS — Z7689 Persons encountering health services in other specified circumstances: Secondary | ICD-10-CM | POA: Diagnosis not present

## 2019-12-16 DIAGNOSIS — I69354 Hemiplegia and hemiparesis following cerebral infarction affecting left non-dominant side: Secondary | ICD-10-CM | POA: Insufficient documentation

## 2019-12-16 DIAGNOSIS — R208 Other disturbances of skin sensation: Secondary | ICD-10-CM

## 2019-12-16 DIAGNOSIS — R2681 Unsteadiness on feet: Secondary | ICD-10-CM | POA: Diagnosis not present

## 2019-12-16 MED ORDER — BACLOFEN 10 MG PO TABS: 10.0000 mg | ORAL_TABLET | Freq: Two times a day (BID) | ORAL | 1 refills | Status: DC

## 2019-12-16 NOTE — Therapy (Signed)
[facility] [address] [place], [place], [address] Phone: [phone]   Fax:  [phone]  Physical Therapy Evaluation  Patient Details  Name: Matthew Black MRN: [id number] Date of Birth: 08-17-1976 Referring Provider (PT): Dr. Naaman Plummer but f/u with Dr. Ranell Patrick   Encounter Date: 12/16/2019  PT End of Session - 12/16/19 1600    Visit Number  1    Number of Visits  4    Date for PT Re-Evaluation  --   after 3 visit   Authorization Type  Medicaid auth pending    PT Start Time  [id number]    PT Stop Time  1538    PT Time Calculation (min)  51 min    Equipment Utilized During Treatment  Gait belt    Activity Tolerance  Patient tolerated treatment well    Behavior During Therapy  Flat affect       Past Medical History:  Diagnosis Date  . 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 CRANIOTOMY Right 07/19/2019   Procedure: RIGHT HEMI-CRANIECTOMY With implantation of skull flap to abdominal wall;  Surgeon: Consuella Lose, MD;  Location: [facility];  Service: Neurosurgery;  Laterality: Right;  . CYST EXCISION N/A 10/08/2016   Procedure: EXCISION OF POSTERIOR NECK CYST;  Surgeon: Clovis Riley, MD;  Location: [facility];  Service: General;  Laterality: N/A;  . INCISION AND DRAINAGE ABSCESS N/A 09/22/2014   Procedure: INCISION AND DRAINAGE ABSCESS POSTERIOR NECK;  Surgeon: Pedro Earls, MD;  Location: [facility];  Service: General;  Laterality: N/A;  . INCISION AND DRAINAGE ABSCESS N/A 12/20/2015   Procedure: INCISION AND DRAINAGE POSTERIOR NECK MASS;  Surgeon: Armandina Gemma, MD;  Location: [facility];  Service: General;  Laterality: N/A;  . INCISION AND DRAINAGE ABSCESS Left 07/10/2004   middle finger  . IR IVC FILTER PLMT / S&I /IMG GUID/MOD SED  07/21/2019  . IR RADIOLOGIST EVAL & MGMT  12/14/2019  . IR VENOGRAM RENAL UNI RIGHT  07/21/2019  . MASS EXCISION N/A 07/21/2017   Procedure: EXCISION  OF BENIGN NECK LESION WITH LAYERED CLOSURE;  Surgeon: Irene Limbo, MD;  Location: [facility];  Service: Plastics;  Laterality: N/A;  . MASS EXCISION N/A 11/10/2017   Procedure: EXCISION BENIGN LESION OF THE NECK WITH LAYERED CLOSURE;  Surgeon: Irene Limbo, MD;  Location: [facility];  Service: Plastics;  Laterality: N/A;    Vitals:   12/16/19 1455  BP: 102/74  Pulse: 77     Subjective Assessment - 12/16/19 1455    Subjective  Pt s/p R MCA/ACA in 07/18/20 with R hemicraniectomy and implantation skull flap into abdomen (R side). Pt has helmet.  Pt hospitalized 07/19/19 to 10/07/19 with CIR and HHPT/OT after d/c from CIR. Pt has not been able to amb. yet but did practice stepping at counter with wife and HHPT and hemi-walker. Pt denied falls since last visit. Pt's wife has been contacting pharmacy trying to get Baclofen refill but it isn't ready yet, will call MD office.    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  Yes    Pain Score  6     Pain Location  Head    Pain Orientation  Right    Pain Descriptors / Indicators  Headache;Pressure    Pain Type  Chronic pain    Pain Onset  More than a month ago    Pain Frequency  Intermittent    Aggravating Factors   standing up    Pain Relieving Factors  lying down    Multiple Pain Sites  Yes    Pain  Score  5    Pain Location  Shoulder    Pain Orientation  Right    Pain Descriptors / Indicators  Aching    Pain Type  Chronic pain    Pain Onset  More than a month ago    Pain Frequency  Constant    Aggravating Factors   movement, overuse    Pain Relieving Factors  nothing really         [facility] PT Assessment - 12/16/19 1506      Assessment   Medical Diagnosis  R MCA/ACA CVA    Referring Provider (PT)  Dr. Naaman Plummer but f/u with Dr. Ranell Patrick    Onset Date/Surgical Date  07/18/20    Hand Dominance  Right    Next MD Visit  12/20/19    Prior Therapy  Acute, CIR, and HHPT/OT      Precautions   Precautions  Fall;Other (comment)   skull flap in R abdomen     Restrictions   Weight Bearing Restrictions  No      Balance Screen   Has the patient fallen in the past 6 months  No    Has the patient had a decrease in activity level because of a fear of falling?   Yes    Is the patient reluctant to leave their home because of a fear of falling?   Yes      [place] residence    Living Arrangements  Spouse/significant other;Children    Available Help at Discharge  Available 24 hours/day    Type of [place] to enter;Ramped entrance   portable ramp   Entrance Stairs-Number of Steps  3    Entrance Stairs-Rails  None   use a portable ramp   Home Layout  Able to live on main level with bedroom/bathroom;Two level   half bath    Alternate Level Stairs-Number of Steps  19    Alternate Level Stairs-Rails  Right    Home Equipment  Wheelchair - manual;Bedside commode;Hospital bed   hemi-walker   Additional Comments  Pt is taking sponge bath, as downstairs is a half bath and w/c doesn't fit into bathroom.       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     Leisure  [place], hang  outside, go to [employer], travel       Cognition   Overall Cognitive Status  Impaired/Different from baseline   no cognitive issues  per wife, flat affect,incr process time      Sensation   Light Touch  Impaired by gross assessment    Additional Comments  Decr. light touch in LUE/LE.      Coordination   Gross Motor Movements are Fluid and Coordinated  No    Fine Motor Movements are Fluid and Coordinated  No      Posture/Postural Control   Posture/Postural Control  Postural limitations    Postural Limitations  Rounded Shoulders;Forward head    Posture Comments  At rest, pt slumped over in w/c stating it's from HA pain.      Tone   Assessment Location  Right Lower Extremity;Left Lower Extremity      ROM / Strength   AROM / PROM / Strength  AROM;Strength      AROM   Overall AROM   Deficits    Overall AROM Comments  RUE/LE WFL. Pt unable to move RUE 2/2 weakness. LLE AROM limited in all directions 2/2 weakness.      Strength   Overall Strength  Deficits    Overall Strength Comments  RUE WNL, RLE: grossly 4+/5. LLE: hip flexion: 2-/5, knee ext: 2-/5 (difficult to grade as pt utilized hip/knee muscular together along with compensatory trunk movements)., knee flex: 2/5, ankle dorsiflexion: 2/5. Hip ab/ad/ext not formally assessed but weakness suspected based on txfs.       Transfers   Transfers  Sit to Stand;Stand to Sit;Stand Pivot Transfers;Squat Pivot Transfers    Sit to Stand  4: Min assist;3: Mod assist;With upper extremity assist;From chair/3-in-1    Sit to Stand Details  Tactile cues for sequencing;Tactile cues for weight shifting;Tactile cues for placement;Verbal cues for technique;Visual cues/gestures for sequencing;Visual cues for safe use of DME/AE;Manual facilitation for weight shifting;Manual facilitation for placement    Sit to Stand Details (indicate cue type and reason)  Cues for STS txfs to/from w/c and mat. Pt is used to reaching forward for w/c to txf from hospital bed  to w/c at home vs. hand on mat.    Stand to Sit  4: Min assist;With upper extremity assist;To chair/3-in-1    Stand to Sit Details (indicate cue type and reason)  Tactile cues for initiation;Tactile cues for weight shifting;Tactile cues for sequencing;Tactile cues for placement;Visual cues/gestures for sequencing;Verbal cues for sequencing;Verbal cues for technique;Manual facilitation for weight shifting;Manual facilitation for placement    Stand to Sit Details  Cues for placement, sequencing, and improve eccentric control.    Stand Pivot Transfers  3: Mod assist;4: Min assist    Stand Pivot Transfer Details (indicate cue type and reason)  Cues to place L foot flat vs. supination and to weight shifting laterally in order to safety txf from w/c to mat.     Squat Pivot Transfers  4: Min assist;With upper extremity assistance    Squat Pivot Transfer Details (indicate cue type and reason)  Min A during mat to w/c txf, cues for sequencing and hand placement.     Number of Reps  2 sets      Ambulation/Gait   Ambulation/Gait  No   unable to stand safely with support     Balance   Balance Assessed  Yes      Static Standing Balance   Static Standing - Balance Support  No upper extremity supported    Static Standing - Level of Assistance  3: Mod assist;4: Min assist    Static Standing - Comment/# of Minutes  Pt required min A  to mod A assist to remain in upright position without UE support for < 10 seconds.       RLE Tone   RLE Tone  Modified Ashworth      RLE Tone   Modified Ashworth Scale for Grading Hypertonia RLE  No increase in muscle tone      LLE Tone   LLE Tone  Modified Ashworth      LLE Tone   Modified Ashworth Scale for Grading Hypertonia LLE  Considerable increase in muschle tone, passive movement difficult                Objective measurements completed on examination: See above findings.              PT Education - 12/16/19 1558    Education Details  PT  discussed exam findings, frequency, and duration (pending Medicaid auth). PT encouraged pt to wear helmet to therapy session for safety and to f/u with Dr. Ranell Patrick re: pt needing refills on Baclofen (has been out for two weeks) and BP meds (out today). PT discussed the safety of txfs and how to improve sequencing.    Person(s) Educated  Patient;Spouse    Methods  Explanation;Demonstration;Tactile cues;Verbal cues    Comprehension  Returned demonstration;Verbalized understanding;Need further instruction       PT Short Term Goals - 12/16/19 1607      PT SHORT TERM GOAL #1   Title  same as LTGs        PT Long Term Goals - 12/16/19 1608      PT LONG TERM GOAL #1   Title  Pt will perform initial HEP with min  A from wife to improve balance, strength, and flexibility. TARGET DATE FOR ALL LTGS: after 3 visits    Baseline  No current HEP    Status  New      PT LONG TERM GOAL #2   Title  Pt will perform sit<>stand txfs with min guard to improve functional mobility and safety.    Baseline  min A -mod A during STS txfs    Status  New      PT LONG TERM GOAL #3   Title  Assess bed mobility and write goal as indicated.    Baseline  Not assessed 2/2 time constraints.    Status  New      PT LONG TERM GOAL #4   Title  Pt will perform squat pivot txfs to/from w/c to mat with min guard to improve safety during mobility.    Status  New      PT LONG TERM GOAL #5   Title  Perform formal balance and gait assessements when appropriate    Baseline  Not safe at this time.    Status  New             Plan - 12/16/19 1601    Clinical Impression Statement  Pt is a 44y/o male presenting s/p R MCA/ACA on 07/19/19, R hemicraniectomy and implantation skull flap into R abdomen, with hemorrhagic conversion with hematoma and cerebral abscess, and B DVT and PE, s/p IVC filter placed on 07/21/19. Pt's PMH not significant. The following impairments were noted upon exam: impaired balance, L sided weakness,  gait not attempted as pt unable to safely stand but deviations would be likely 2/2 balance and strength deficits, impaired sensation, decr. coordination, decr. endurance. PT will formally assess balance and gait once pt is able to tolerate. Pt would benefit from skilled PT  to improve safety during functional mobility.    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 08/20/19 w aspiration 08/21/19, (tx empircally w vanco/ cefepime), LLE DVT 07/21/19 and small left lower lung PE 08/01/19 s/p IVC filter 07/21/19, iv heparin discontinued due to worsening ICH 08/14/19, and abdominal wall hematoma, seizure, pseudomonas uti,    Examination-Activity Limitations  Bathing;Sit;Bed Mobility;Bend;Squat;Caring for Others;Stairs;Stand;Carry;Toileting;Dressing;Transfers;Hygiene/Grooming;Locomotion Level    Examination-Participation Restrictions  Driving;Other;Meal Prep   running business   Stability/Clinical Decision Making  Evolving/Moderate complexity    Clinical Decision Making  Moderate    Rehab Potential  Good    PT Frequency  Other (comment)   1x/week for 3 treatments and then request more visits with Medicaid   PT Duration  Other (comment)    PT Treatment/Interventions  ADLs/Self Care Home Management;Aquatic Therapy;Biofeedback;Balance training;Therapeutic exercise;Therapeutic activities;Functional mobility training;Wheelchair mobility training;Orthotic Fit/Training;Stair training;Gait training;DME Instruction;Patient/family education;Cognitive remediation;Neuromuscular re-education;Vestibular    PT Next Visit Plan  Assess bed mobility, initiate strength, flexibility and balance HEP    Recommended Other Services  OT    Consulted  and Agree with Plan of Care  Patient;Family member/caregiver    Family Member Consulted  wife: Melene Muller       Patient will benefit from skilled therapeutic intervention in order to improve the following deficits and impairments:  Abnormal gait, Difficulty walking, Decreased mobility, Decreased balance, Decreased cognition, Decreased range of motion, Decreased coordination, Decreased safety awareness, Impaired flexibility, Postural dysfunction, Decreased knowledge of use of DME, Decreased strength, Impaired UE functional use, Pain, Impaired tone, Impaired sensation, Decreased endurance(PT will monitor pain closely but not directly treat)  Visit Diagnosis: Other abnormalities of gait and mobility - Plan: PT plan of care cert/re-cert  Hemiplegia and hemiparesis following cerebral infarction affecting left non-dominant side (HCC) - Plan: PT plan of care cert/re-cert  Unsteadiness on feet - Plan: PT plan of care cert/re-cert  Other disturbances of skin sensation - Plan: PT plan of care cert/re-cert     Problem List Patient Active Problem List   Diagnosis Date Noted  . Pancytopenia ([place]) 12/02/2019  . Nephrolithiasis 12/02/2019  . Hydronephrosis with renal and ureteral calculus obstruction 12/02/2019  . Abdominal pain 12/02/2019  . Hyperkalemia 12/02/2019  . Reactive depression   . Wound infection after surgery   . Low grade fever   . Sleep disturbance   . Dysphagia, post-stroke   . Transaminitis   . 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 Cerebral abscess   . Urinary tract infection without hematuria   . Altered mental status   . Primary hypercoagulable state ([place])   . Acute pulmonary embolism without acute cor pulmonale (HCC)   . Deep vein thrombosis (DVT) of non-extremity vein   . Hypokalemia   . FUO (fever of unknown origin)   . Acute blood loss anemia   . SIRS (systemic inflammatory response syndrome) (HCC)   . Leukocytosis   . Endotracheal tube present   .  Acute respiratory failure with hypoxemia ([place])   . Stroke (cerebrum) ([place]) 07/19/2019  . Pressure injury of skin 07/19/2019  . Acute CVA (cerebrovascular accident) ([place])   . Encephalopathy   . Dysphagia   . Acute encephalopathy   . Essential hypertension   . Annual physical exam 09/11/2016  .  Obesity 03/12/2016  . PCP NOTES >>>>>>>>>>>>>>>>>. 03/12/2016  . Scalp abscess 12/20/2015  . Neck abscess 12/20/2015  . Pilonidal cyst 02/08/2013    Tabius Rood L 12/16/2019, 4:12 PM  [facility] [address] [place], [place], [address] Phone: [phone]   Fax:  [phone]  Name: YANIV EPPERT MRN: [id number] Date of Birth: 1976/10/22  Geoffry Paradise, PT,DPT 12/16/19 4:12 PM Phone: [phone] Fax: [phone]

## 2019-12-19 ENCOUNTER — Telehealth: Payer: Self-pay | Admitting: *Deleted

## 2019-12-19 ENCOUNTER — Other Ambulatory Visit: Payer: Self-pay | Admitting: Physical Medicine and Rehabilitation

## 2019-12-19 DIAGNOSIS — I639 Cerebral infarction, unspecified: Secondary | ICD-10-CM

## 2019-12-19 MED ORDER — METOPROLOL TARTRATE 50 MG PO TABS: 50.0000 mg | ORAL_TABLET | Freq: Three times a day (TID) | ORAL | 1 refills | Status: DC

## 2019-12-19 MED ORDER — BACLOFEN 10 MG PO TABS: 10.0000 mg | ORAL_TABLET | Freq: Two times a day (BID) | ORAL | 1 refills | Status: DC

## 2019-12-19 NOTE — Telephone Encounter (Signed)
Patients caregiver left a message stating that he needs refills on his lopressor and baclofen

## 2019-12-20 ENCOUNTER — Encounter: Payer: Self-pay | Admitting: Physical Medicine and Rehabilitation

## 2019-12-20 ENCOUNTER — Other Ambulatory Visit: Payer: Self-pay

## 2019-12-20 ENCOUNTER — Encounter
Payer: Medicaid Other | Attending: Physical Medicine and Rehabilitation | Admitting: Physical Medicine and Rehabilitation

## 2019-12-20 VITALS — BP 116/81 | HR 73 | Ht 72.0 in | Wt 190.0 lb

## 2019-12-20 DIAGNOSIS — Z8673 Personal history of transient ischemic attack (TIA), and cerebral infarction without residual deficits: Secondary | ICD-10-CM | POA: Insufficient documentation

## 2019-12-20 DIAGNOSIS — R519 Headache, unspecified: Secondary | ICD-10-CM | POA: Diagnosis not present

## 2019-12-20 DIAGNOSIS — G8929 Other chronic pain: Secondary | ICD-10-CM

## 2019-12-20 DIAGNOSIS — G060 Intracranial abscess and granuloma: Secondary | ICD-10-CM | POA: Diagnosis not present

## 2019-12-20 DIAGNOSIS — R531 Weakness: Secondary | ICD-10-CM | POA: Insufficient documentation

## 2019-12-20 DIAGNOSIS — Z79899 Other long term (current) drug therapy: Secondary | ICD-10-CM | POA: Insufficient documentation

## 2019-12-20 DIAGNOSIS — I63511 Cerebral infarction due to unspecified occlusion or stenosis of right middle cerebral artery: Secondary | ICD-10-CM | POA: Diagnosis not present

## 2019-12-20 DIAGNOSIS — N200 Calculus of kidney: Secondary | ICD-10-CM | POA: Diagnosis not present

## 2019-12-20 DIAGNOSIS — R4584 Anhedonia: Secondary | ICD-10-CM | POA: Insufficient documentation

## 2019-12-20 DIAGNOSIS — Z87891 Personal history of nicotine dependence: Secondary | ICD-10-CM | POA: Insufficient documentation

## 2019-12-20 DIAGNOSIS — R252 Cramp and spasm: Secondary | ICD-10-CM | POA: Diagnosis not present

## 2019-12-20 DIAGNOSIS — G8918 Other acute postprocedural pain: Secondary | ICD-10-CM | POA: Insufficient documentation

## 2019-12-20 NOTE — Progress Notes (Signed)
Subjective:    Patient ID: Matthew Black, male    DOB: May 04, 1976, 44 y.o.   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 His wife accompanies him and provides most of the history.  Their chief complaint is that Matthew Black has been having pain at his surgical site, with continued yellow drainage. He describes a headache that is bifrontal and not associated with nausea, vomiting, photophobia, or phonophobia.  She has been giving him Tylenol and Tramadol for the pain, q4 and q6H respectively. She asks whether they may be given close together if needed.   Otherwise, wife and patient have no complaints. Wife notes that patient has little interest in most things. He has been able to verbally communicate with her to a minimal extent and answers yes and no questions by nodding or shaking his head. He has been receiving PT twice per week. During most of the day, he sits in his chair and and does not engage in much activity. His wife says that prior to his stroke, he liked to play basketball with their kids (ages 41 and 4), play with their dog, travel, and relax at home. He has told her that he is happy to be at home.  1/26: Due to national recommendations of social distancing because of COVID 19, an audio/video tele-health visit is felt to be the most appropriate encounter for this patient at this time. See MyChart message from today for the patient's consent to a tele-health encounter with [facility]. This is a follow up tele-visit via phone. The patient is at home. MD is at office.   Wife joins for visit. He has been progressing with outpatient therapy. Is able to take a few steps in his kitchen. Able to perform most ADLs for himself. Wound is healing well. Asks about the results of his CT head. Asks about how to prevent kidney Black. He does eat a lot of  spinach.   He has been having headaches and takes no medications for them--lies down and they resolve.  He has been feeling depression. Has been refusing depression medication. Does not want to speak to a neuropsychologist.  He needs refills for baclofen and lopressor. He has noticed an increase in spasticity since he ran out of baclofen.    Pain Inventory Average Pain 6 Pain Right Now 0 My pain is stabbing  In the last 24 hours, has pain interfered with the following? General activity 10 Relation with others 9 Enjoyment of life 8 What TIME of day is your pain at its worst? daytime Sleep (in general) Fair  Pain is worse with: sitting Pain improves with: rest Relief from Meds: .  Mobility ability to climb steps?  no do you drive?  no use a wheelchair needs help with transfers  Function disabled: date disabled 2020 I need assistance with the following:  dressing, bathing, toileting, meal prep, household duties and shopping  Neuro/Psych weakness trouble walking spasms  Prior Studies x-rays CT/MRI  Physicians involved in your care Urologist   Family History  Problem Relation Age of Onset  . Diabetes Other        GF  . Healthy Mother   . Healthy Father    Social History   Socioeconomic History  . Marital status: Married    Spouse name: Not on file  . Number of children: 2  .  Years of education: Not on file  . Highest education level: Not on file  Occupational History  . Occupation: Clinical biochemist   Tobacco Use  . Smoking status: Former Smoker    Packs/day: 0.00    Quit date: 11/24/2015    Years since quitting: 4.0  . Smokeless tobacco: Never Used  . Tobacco comment:    Substance and Sexual Activity  . Alcohol use: Yes    Comment: occasionally  . Drug use: Never  . Sexual activity: Not on file  Other Topics Concern  . Not on file  Social History Narrative   Household-- pt, wife, 2 children   Social Determinants of Health   Financial  Resource Strain:   . Difficulty of Paying Living Expenses: Not on file  Food Insecurity:   . Worried About Charity fundraiser in the Last Year: Not on file  . Ran Out of Food in the Last Year: Not on file  Transportation Needs:   . Lack of Transportation (Medical): Not on file  . Lack of Transportation (Non-Medical): Not on file  Physical Activity:   . Days of Exercise per Week: Not on file  . Minutes of Exercise per Session: Not on file  Stress:   . Feeling of Stress : Not on file  Social Connections:   . Frequency of Communication with Friends and Family: Not on file  . Frequency of Social Gatherings with Friends and Family: Not on file  . Attends Religious Services: Not on file  . Active Member of Clubs or Organizations: Not on file  . Attends Archivist Meetings: Not on file  . Marital Status: Not on file   Past Surgical History:  Procedure Laterality Date  . CRANIOTOMY Right 07/19/2019   Procedure: RIGHT HEMI-CRANIECTOMY With implantation of skull flap to abdominal wall;  Surgeon: Consuella Lose, MD;  Location: [facility];  Service: Neurosurgery;  Laterality: Right;  . CYST EXCISION N/A 10/08/2016   Procedure: EXCISION OF POSTERIOR NECK CYST;  Surgeon: Clovis Riley, MD;  Location: [facility];  Service: General;  Laterality: N/A;  . INCISION AND DRAINAGE ABSCESS N/A 09/22/2014   Procedure: INCISION AND DRAINAGE ABSCESS POSTERIOR NECK;  Surgeon: Pedro Earls, MD;  Location: [facility];  Service: General;  Laterality: N/A;  . INCISION AND DRAINAGE ABSCESS N/A 12/20/2015   Procedure: INCISION AND DRAINAGE POSTERIOR NECK MASS;  Surgeon: Armandina Gemma, MD;  Location: [facility];  Service: General;  Laterality: N/A;  . INCISION AND DRAINAGE ABSCESS Left 07/10/2004   middle finger  . IR IVC FILTER PLMT / S&I /IMG GUID/MOD SED  07/21/2019  . IR RADIOLOGIST EVAL & MGMT  12/14/2019  . IR VENOGRAM RENAL UNI RIGHT  07/21/2019  . MASS EXCISION N/A 07/21/2017   Procedure: EXCISION OF BENIGN  NECK LESION WITH LAYERED CLOSURE;  Surgeon: Irene Limbo, MD;  Location: [facility];  Service: Plastics;  Laterality: N/A;  . MASS EXCISION N/A 11/10/2017   Procedure: EXCISION BENIGN LESION OF THE NECK WITH LAYERED CLOSURE;  Surgeon: Irene Limbo, MD;  Location: [facility];  Service: Plastics;  Laterality: N/A;   Past Medical History:  Diagnosis Date  . 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   BP 116/81 Comment: pt reported, virtual visit  Pulse 73 Comment: pt reported, virtual visit  Ht 6' (1.829 m)   Wt 190 lb (86.2 kg) Comment: pt reported, virtual visit  BMI 25.77 kg/m   Opioid Risk Score:   Fall Risk Score:  `  1  Depression screen PHQ 2/9  Depression screen [facility] 2/9 09/11/2016 03/11/2016  Decreased Interest 0 0  Down, Depressed, Hopeless 0 0  PHQ - 2 Score 0 0    Review of Systems  Genitourinary:       Bladder control   Musculoskeletal: Positive for gait problem.  Neurological: Positive for weakness.  All other systems reviewed and are negative.      Objective:   Physical Exam  Not performed since patient was seen through WebEx.       Assessment & Plan:  Matthew Black is a a 44 year old man who presents for follow-up of his right middle cerebral artery stroke. His wife accompanies him and provides most of the history.  Headache: -Matthew Black continues to experience headaches though his surgical site is well healed and his CT from 12/31 shows reduction in hematoma size. Discussed results with patient and wife. Likely secondary to resorption of hematoma. Currently not taking any medication. Advised that he can take tylenol if pain is severe. -No longer taking Tramadol.   Tachycardia: -Provided refill of Lopressor yesterday.  Anhedonia and depression -Advised that patient should resume Celexa and he agrees. -Offered neuropsych consult but he defers -Advised regarding the benefits of exercise in treating depression.   Weakness/spasticity: -Advised continuation of baclofen and outpatient therapy. Recommended that wife and both kids participate in daily range of motion exercises for Matthew Black to prevent contracture formation. Currently most at risk area appears to be left knee flexors. Refilled baclofen.   Thirty minutes of face to face patient care time were spent during this visit. All questions were encouraged and answered. Follow up with me in 3 months via Webex.

## 2019-12-23 ENCOUNTER — Telehealth: Payer: Self-pay

## 2019-12-23 DIAGNOSIS — R252 Cramp and spasm: Secondary | ICD-10-CM

## 2019-12-23 MED ORDER — BACLOFEN 10 MG PO TABS: 10.0000 mg | ORAL_TABLET | Freq: Two times a day (BID) | ORAL | 1 refills | Status: DC

## 2019-12-23 NOTE — Telephone Encounter (Signed)
Refill request for Baclofen sent to pharmacy

## 2019-12-29 ENCOUNTER — Ambulatory Visit: Payer: Medicaid Other | Admitting: Rehabilitation

## 2019-12-30 ENCOUNTER — Other Ambulatory Visit: Payer: Self-pay

## 2019-12-30 ENCOUNTER — Ambulatory Visit: Payer: Medicaid Other | Attending: Physician Assistant

## 2019-12-30 DIAGNOSIS — R2689 Other abnormalities of gait and mobility: Secondary | ICD-10-CM | POA: Diagnosis not present

## 2019-12-30 DIAGNOSIS — I69354 Hemiplegia and hemiparesis following cerebral infarction affecting left non-dominant side: Secondary | ICD-10-CM | POA: Diagnosis not present

## 2019-12-30 DIAGNOSIS — R2681 Unsteadiness on feet: Secondary | ICD-10-CM

## 2019-12-30 DIAGNOSIS — M6281 Muscle weakness (generalized): Secondary | ICD-10-CM | POA: Diagnosis not present

## 2019-12-30 DIAGNOSIS — Z7689 Persons encountering health services in other specified circumstances: Secondary | ICD-10-CM | POA: Diagnosis not present

## 2019-12-30 NOTE — Patient Instructions (Signed)
Access Code: [email or web address] URL: [email or web address] Date: 12/30/2019 Prepared by: Geoffry Paradise  Exercises Supine Bridge - 5 reps - 3 sets - 2 hold - 1x daily - 6x weekly Bent Knee Fallouts - 5 reps - 3 sets - 1x daily - 6x weekly Supine Heel Slide with Strap - 5 reps - 2 sets - 1x daily - 6x weekly Supine Knee Extension Strengthening - 5 reps - 3 sets - 1x daily - 6x weekly

## 2019-12-30 NOTE — Therapy (Signed)
[facility] [address] [place], [place], [address] Phone: [phone]   Fax:  [phone]  Physical Therapy Treatment  Patient Details  Name: Matthew Black MRN: [id number] Date of Birth: [birth year] Referring Provider (PT): Dr. Naaman Plummer but f/u with Dr. Ranell Patrick   Encounter Date: 12/30/2019  PT End of Session - 12/30/19 1850    Visit Number  2    Number of Visits  4    Date for PT Re-Evaluation  --   after 3 visits   Authorization Type  Medicaid 3 visits approved 12/30/19-01/19/20    Authorization - Visit Number  1    Authorization - Number of Visits  3    PT Start Time  1319    PT Stop Time  1403    PT Time Calculation (min)  44 min    Equipment Utilized During Treatment  Gait belt    Activity Tolerance  Patient limited by pain   HA pain   Behavior During Therapy  Flat affect       Past Medical History:  Diagnosis Date  . 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 CRANIOTOMY Right 07/19/2019   Procedure: RIGHT HEMI-CRANIECTOMY With implantation of skull flap to abdominal wall;  Surgeon: Consuella Lose, MD;  Location: [facility];  Service: Neurosurgery;  Laterality: Right;  . CYST EXCISION N/A 10/08/2016   Procedure: EXCISION OF POSTERIOR NECK CYST;  Surgeon: Clovis Riley, MD;  [facility];  Service: General;  Laterality: N/A;  . INCISION AND DRAINAGE ABSCESS N/A 09/22/2014   Procedure: INCISION AND DRAINAGE ABSCESS POSTERIOR NECK;  Surgeon: Pedro Earls, MD;  [facility];  Service: General;  Laterality: N/A;  . INCISION AND DRAINAGE ABSCESS N/A 12/20/2015   Procedure: INCISION AND DRAINAGE POSTERIOR NECK MASS;  Surgeon: Armandina Gemma, MD;  [facility];  Service: General;  Laterality: N/A;  . INCISION AND DRAINAGE ABSCESS Left 07/10/2004   middle finger  . IR IVC FILTER PLMT / S&I /IMG GUID/MOD SED  07/21/2019  . IR RADIOLOGIST EVAL & MGMT  12/14/2019   . IR VENOGRAM RENAL UNI RIGHT  07/21/2019  . MASS EXCISION N/A 07/21/2017   Procedure: EXCISION OF BENIGN NECK LESION WITH LAYERED CLOSURE;  Surgeon: Irene Limbo, MD;  Location: [facility];  Service: Plastics;  Laterality: N/A;  . MASS EXCISION N/A 11/10/2017   Procedure: EXCISION BENIGN LESION OF THE NECK WITH LAYERED CLOSURE;  Surgeon: Irene Limbo, MD;  Location: [facility];  Service: Plastics;  Laterality: N/A;    There were no vitals filed for this visit.  Subjective Assessment - 12/30/19 1326    Subjective  Pt denied changes or falls since last visit. Pt has been taking steps at counter and hemi-walker at home. Pt brought to PT today in tilt/recline with head support manual w/c. Pt noted to present with flexed posture, holding head 2/2 headache pain.    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 08/20/19 w aspiration 08/21/19, (tx empircally w vanco/ cefepime), LLE DVT 07/21/19 and small left lower lung PE 08/01/19 s/p IVC filter 07/21/19, iv heparin discontinued due to worsening ICH 08/14/19,  and abdominal wall hematoma, seizure, pseudomonas uti,    Patient Stated Goals  to be able to walk, use my L hand, improve balance, walk up the stairs to the bedroom    Pain Score  8     Pain Location  Head    Pain Orientation  Right    Pain Descriptors / Indicators  Headache;Pressure    Pain Type  Chronic pain    Pain Onset  More than a month ago    Pain Frequency  Intermittent    Aggravating Factors   sitting and standing up    Pain Relieving Factors  lying down         NMR and therex: Access Code: [email or web address] URL: [email or web address] Date:  12/30/2019 Prepared by: Geoffry Paradise  Exercises Supine Bridge - 5 reps - 3 sets - 2 hold - 1x daily - 6x weekly (NMR to facilitate core and hamstring/glute activation) Bent Knee Fallouts - 5 reps - 3 sets - 1x daily - 6x weekly (NMR to facilitate L glute med activation and to decr. Compensatory strategies-using RLE) Supine Heel Slide with Strap - 5 reps - 2 sets - 1x daily - 6x weekly (NMR to facilitate L hamstring activation) Supine Knee Extension Strengthening - 5 reps - 3 sets - 1x daily - 6x weekly (therex) Seated marches x5 reps with alternating LEs  All performed with min guard to S for safety, along with demo and cues (tactile and verbal), along with manual facilitation as indicated for proper technique and to initiate movement. Provided all supine activities as HEP. Pt reported HA pain decr. To 5/10 upon lying supine vs. 8/10 in seated position. Pt required rest breaks to ensure proper technique and 2/2 fatigue.               [facility] Adult PT Treatment/Exercise - 12/30/19 1329      Bed Mobility   Bed Mobility  Sit to Supine;Supine to Sit    Supine to Sit  Set up assist;Minimal Assistance - Patient > 75%   Cues to use RLE to assist and sequencing   Sit to Supine  Minimal Assistance - Patient > 75%;Set up assist;Moderate Assistance - Patient 50-74%   Cues to use RLE to assist and sequencing            PT Education - 12/30/19 1849    Education Details  PT provided pt with HEP in supine, as pain reduced to 5/10 while lying supine. Pt's wife stated pt's MD is following pt for HA pain and is aware of current struggle with managing pain.    Person(s) Educated  Patient;Spouse    Methods  Explanation;Demonstration;Tactile cues;Verbal cues;Handout    Comprehension  Returned demonstration;Need further instruction;Verbalized understanding       PT Short Term Goals - 12/16/19 1607      PT SHORT TERM GOAL #1   Title  same as LTGs        PT Long Term Goals -  12/30/19 1854      PT LONG TERM GOAL #1   Title  Pt will perform initial HEP with min  A from wife to improve balance, strength, and flexibility. TARGET DATE FOR ALL LTGS: after 3 visits    Baseline  No current HEP    Status  New      PT LONG TERM GOAL #2   Title  Pt will perform sit<>stand txfs with min guard to improve functional mobility and safety.    Baseline  min A -mod A during STS txfs    Status  New      PT LONG TERM GOAL #3   Title  Assess bed mobility and write goal as indicated.    Baseline  Not assessed 2/2 time constraints.    Status  Achieved      PT LONG TERM GOAL #4   Title  Pt will perform squat pivot txfs to/from w/c to mat with min guard to improve safety during mobility.    Baseline  Performed with min A at eval    Status  New      PT LONG TERM GOAL #5   Title  Perform formal balance and gait assessements when appropriate    Baseline  Not safe at this time.    Status  New      Additional Long Term Goals   Additional Long Term Goals  Yes            Plan - 12/30/19 1851    Clinical Impression Statement  Today's skilled session focused on initiating HEP. Pt unable to tolerate sitting upright 2/2 HA pain but agreeable to exercises in supine position. Pt required cues and demo for proper technique, and intermittent min A to keep LLE stabilized during exercises 2/2 weakness. pt required min-mod A during supine<>sit txfs 2/2 weakness. Pt was able to attempt seated marches at end of session x5 reps/LE. Pt would continue to benefit from skilled PT to improve safety during functional mobility.    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 08/20/19 w aspiration 08/21/19, (tx  empircally w vanco/ cefepime), LLE DVT 07/21/19 and small left lower lung PE 08/01/19 s/p IVC filter 07/21/19, iv heparin discontinued due to worsening ICH 08/14/19, and abdominal wall hematoma, seizure, pseudomonas uti,    Examination-Activity Limitations  Bathing;Sit;Bed Mobility;Bend;Squat;Caring for Others;Stairs;Stand;Carry;Toileting;Dressing;Transfers;Hygiene/Grooming;Locomotion Level    Examination-Participation Restrictions  Driving;Other;Meal Prep   running business   Stability/Clinical Decision Making  Evolving/Moderate complexity    Rehab Potential  Good    PT Frequency  Other (comment)   1x/week for 3 treatments and then request more visits with Medicaid   PT Duration  Other (comment)    PT Treatment/Interventions  ADLs/Self Care Home Management;Aquatic Therapy;Biofeedback;Balance training;Therapeutic exercise;Therapeutic activities;Functional mobility training;Wheelchair mobility training;Orthotic Fit/Training;Stair training;Gait training;DME Instruction;Patient/family education;Cognitive remediation;Neuromuscular re-education;Vestibular    PT Next Visit Plan  initiate strength (in seated position when tolerated based on HA pain in seated position), add flexibility and balance HEP (with UE support at counter)    Consulted and Agree with Plan of Care  Patient;Family member/caregiver    Family Member Consulted  wife: Melene Muller       Patient will benefit from skilled therapeutic intervention in order to improve the following deficits and impairments:  Abnormal gait, Difficulty walking, Decreased mobility, Decreased balance, Decreased cognition, Decreased range of motion, Decreased coordination, Decreased safety awareness, Impaired flexibility, Postural dysfunction, Decreased knowledge of use of DME, Decreased strength, Impaired UE functional use, Pain, Impaired tone, Impaired sensation, Decreased endurance(PT will monitor pain closely but not directly treat)  Visit Diagnosis: Hemiplegia and  hemiparesis following cerebral infarction affecting left non-dominant side (HCC)  Unsteadiness on feet  Other abnormalities of gait and mobility     Problem List Patient Active Problem List  Diagnosis Date Noted  . Pancytopenia ([facility]) 12/02/2019  . Nephrolithiasis 12/02/2019  . Hydronephrosis with renal and ureteral calculus obstruction 12/02/2019  . Abdominal pain 12/02/2019  . Hyperkalemia 12/02/2019  . Reactive depression   . Wound infection after surgery   . Low grade fever   . Sleep disturbance   . Dysphagia, post-stroke   . Transaminitis   . Right middle cerebral artery stroke ([facility]) 09/06/2019  . Cerebral abscess   . Urinary tract infection without hematuria   . Altered mental status   . Primary hypercoagulable state ([facility])   . Acute pulmonary embolism without acute cor pulmonale (HCC)   . Deep vein thrombosis (DVT) of non-extremity vein   . Hypokalemia   . FUO (fever of unknown origin)   . Acute blood loss anemia   . SIRS (systemic inflammatory response syndrome) (HCC)   . Leukocytosis   . Endotracheal tube present   . Acute respiratory failure with hypoxemia ([facility])   . Stroke (cerebrum) ([facility]) 07/19/2019  . Pressure injury of skin 07/19/2019  . Acute CVA (cerebrovascular accident) ([facility])   . Encephalopathy   . Dysphagia   . Acute encephalopathy   . Essential hypertension   . Annual physical exam 09/11/2016  . Obesity 03/12/2016  . PCP NOTES >>>>>>>>>>>>>>>>>. 03/12/2016  . Scalp abscess 12/20/2015  . Neck abscess 12/20/2015  . Pilonidal cyst 02/08/2013    Mar Walmer L 12/30/2019, 7:00 PM  [facility] [address] [place], [place], [address] Phone: [phone]   Fax:  [phone]  Name: Matthew Black MRN: [id number] Date of Birth: [birth year]  Geoffry Paradise, PT,DPT 12/30/19 7:05 PM Phone: [phone] Fax: [phone]

## 2020-01-02 NOTE — Progress Notes (Signed)
I agree! Have sent a referral, thank you!

## 2020-01-02 NOTE — Addendum Note (Signed)
Addended by: Izora Ribas on: 01/02/2020 08:20 AM   Modules accepted: Orders

## 2020-01-06 ENCOUNTER — Other Ambulatory Visit: Payer: Self-pay

## 2020-01-06 ENCOUNTER — Ambulatory Visit: Payer: Medicaid Other

## 2020-01-06 DIAGNOSIS — R2689 Other abnormalities of gait and mobility: Secondary | ICD-10-CM | POA: Diagnosis not present

## 2020-01-06 DIAGNOSIS — I69354 Hemiplegia and hemiparesis following cerebral infarction affecting left non-dominant side: Secondary | ICD-10-CM

## 2020-01-06 DIAGNOSIS — Z7689 Persons encountering health services in other specified circumstances: Secondary | ICD-10-CM | POA: Diagnosis not present

## 2020-01-06 DIAGNOSIS — R2681 Unsteadiness on feet: Secondary | ICD-10-CM

## 2020-01-06 DIAGNOSIS — M6281 Muscle weakness (generalized): Secondary | ICD-10-CM | POA: Diagnosis not present

## 2020-01-06 NOTE — Therapy (Signed)
[facility] [address] [place], [place], [address] Phone: [phone]   Fax:  [phone]  Physical Therapy Treatment  Patient Details  Name: Matthew Black MRN: [id number] Date of Birth: 1976/09/29 Referring Provider (PT): Dr. Naaman Plummer but f/u with Dr. Ranell Patrick   Encounter Date: 01/06/2020  PT End of Session - 01/06/20 1530    Visit Number  3    Number of Visits  4    Date for PT Re-Evaluation  --   after 3 visits   Authorization Type  Medicaid 3 visits approved 12/30/19-01/19/20    Authorization - Visit Number  2    Authorization - Number of Visits  3    PT Start Time  1318    PT Stop Time  1358    PT Time Calculation (min)  40 min    Equipment Utilized During Treatment  Gait belt    Activity Tolerance  Patient tolerated treatment well    Behavior During Therapy  Flat affect       Past Medical History:  Diagnosis Date  . 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 CRANIOTOMY Right 07/19/2019   Procedure: RIGHT HEMI-CRANIECTOMY With implantation of skull flap to abdominal wall;  Surgeon: Consuella Lose, MD;  Location: [facility];  Service: Neurosurgery;  Laterality: Right;  . CYST EXCISION N/A 10/08/2016   Procedure: EXCISION OF POSTERIOR NECK CYST;  Surgeon: Clovis Riley, MD;  Location: [facility];  Service: General;  Laterality: N/A;  . INCISION AND DRAINAGE ABSCESS N/A 09/22/2014   Procedure: INCISION AND DRAINAGE ABSCESS POSTERIOR NECK;  Surgeon: Pedro Earls, MD;  Location: [facility];  Service: General;  Laterality: N/A;  . INCISION AND DRAINAGE ABSCESS N/A 12/20/2015   Procedure: INCISION AND DRAINAGE POSTERIOR NECK MASS;  Surgeon: Armandina Gemma, MD;  Location: [facility];  Service: General;  Laterality: N/A;  . INCISION AND DRAINAGE ABSCESS Left 07/10/2004   middle finger  . IR IVC FILTER PLMT / S&I /IMG GUID/MOD SED  07/21/2019  . IR RADIOLOGIST EVAL & MGMT  12/14/2019   . IR VENOGRAM RENAL UNI RIGHT  07/21/2019  . MASS EXCISION N/A 07/21/2017   Procedure: EXCISION OF BENIGN NECK LESION WITH LAYERED CLOSURE;  Surgeon: Irene Limbo, MD;  Location: [facility];  Service: Plastics;  Laterality: N/A;  . MASS EXCISION N/A 11/10/2017   Procedure: EXCISION BENIGN LESION OF THE NECK WITH LAYERED CLOSURE;  Surgeon: Irene Limbo, MD;  Location: [facility];  Service: Plastics;  Laterality: N/A;    There were no vitals filed for this visit.  Subjective Assessment - 01/06/20 1321    Subjective  Pt denied changes or falls since last visit. Pt has been performing HEP. Pt has OT eval set up.    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  No/denies                       [facility] Adult PT Treatment/Exercise - 01/06/20 1520      Transfers   Transfers  Sit to Stand;Stand to [employer] Pivot Transfers    Sit to Stand  4: Min assist;4: Min guard    Sit to Stand Details  Tactile cues for sequencing;Tactile cues for weight shifting;Tactile cues for placement;Verbal cues for technique;Visual cues/gestures for sequencing;Visual cues for safe use of DME/AE    Sit to Stand Details (indicate cue type and reason)  x5 reps during gait in //bars. Pt progressed to min  guard to ensure safety. Cues and demo for placement, sequencing, and anterior weight shifting. Progressed to less cues towards end of session.    Stand to Sit  4: Min assist;With upper extremity assist;To chair/3-in-1;4: Min guard    Stand to Sit Details (indicate cue type and reason)  Tactile cues for initiation;Tactile cues for weight shifting;Tactile cues for sequencing;Tactile cues for placement;Visual cues/gestures for sequencing;Verbal cues for sequencing;Verbal cues for technique    Stand to Sit Details  Cues for placement, sequencing, and improved eccentric control. With pt requiring less cueing towards end of session. Pt progressed to min guard, except for last rep 2/2 fatigue.     Comments  5 reps      Ambulation/Gait   Ambulation/Gait  Yes    Ambulation/Gait Assistance  3: Mod assist;4: Min assist    Ambulation/Gait Assistance Details  Pt amb. in // bars with min A to mod A (+2 assist for tech to guard pt's trunk so PT could focus on manual facilitation at hips and LLE). PT placed a skid proof shoe cap on L foot to decr. friction). Cues to improve upright posture, R step length, heel strike, L hip protraction, and blocked at L knee to prevent buckling. Pt required rest breaks 2/2 fatigue (muscle spasms).  Trialed [facility] during last bout of 10' amb. but pt noted to be more unsteady.     Ambulation Distance (Feet)  10 Feet   X 10   Assistive device  Parallel bars;Large base quad cane    Gait Pattern  Step-to pattern;Decreased stance time - left;Decreased step length - right;Decreased dorsiflexion - left;Decreased dorsiflexion - right;Decreased weight shift to left;Decreased stride length;Left flexed knee in stance;Left steppage;Left genu recurvatum;Scissoring;Trunk flexed;Poor foot clearance - left    Ambulation Surface  Level;Indoor    Pre-Gait Activities  Lateral weight shifting and 180 degree turns to improve lateral weight shifting during gait in order to advance LEs during swing phase of  gait. 3x20 reps.              PT Education - 01/06/20 1526    Education Details  PT discussed the importance of attempting to sit upright in chair or standing with B scapular retraction to improve posture.    Person(s) Educated  Patient;Spouse    Methods  Explanation;Demonstration;Tactile cues;Verbal cues    Comprehension  Verbalized understanding;Returned demonstration;Need further instruction       PT Short Term Goals - 12/16/19 1607      PT SHORT TERM GOAL #1   Title  same as LTGs        PT Long Term Goals - 12/30/19 1854      PT LONG TERM GOAL #1   Title  Pt will perform initial HEP with min  A from wife to improve balance, strength, and flexibility. TARGET DATE FOR ALL LTGS: after 3 visits    Baseline  No current HEP    Status  New      PT LONG TERM GOAL #2   Title  Pt will perform sit<>stand txfs with min guard to improve functional mobility and safety.    Baseline  min A -mod A during STS txfs    Status  New      PT LONG TERM GOAL #3   Title  Assess bed mobility and write goal as indicated.    Baseline  Not assessed 2/2 time constraints.    Status  Achieved      PT LONG TERM GOAL #4   Title  Pt will perform squat pivot txfs to/from w/c to mat with min guard to improve safety during mobility.    Baseline  Performed with min A at eval    Status  New      PT LONG TERM GOAL #5   Title  Perform formal balance and gait assessements when appropriate    Baseline  Not safe at this time.    Status  New      Additional Long Term Goals   Additional Long Term Goals  Yes            Plan - 01/06/20 1530    Clinical Impression Statement  Pt demonstrated progress as he was able to tolerate standing and gait activities without incr. in pain. PT was unable to elicit LUE contraction, therefore, platform RW not attempted. PT attempted amb. with LBQC in // bars but pt noted to experience incr. postural sway and required more assist for advancement of LLE. Pt does has  an appt. with OT on 01/23/20 to addres UE deficits and ADL issues. Pt did require seated rest breaks during session 2/2 fatigue. Pt would continue to benefit from skilled therapy to improve safety during functional mobility.    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 08/20/19 w aspiration 08/21/19, (tx empircally w vanco/ cefepime), LLE DVT 07/21/19 and small left lower lung PE 08/01/19 s/p IVC filter 07/21/19, iv heparin discontinued due to worsening ICH 08/14/19, and abdominal wall hematoma, seizure, pseudomonas uti,    Examination-Activity Limitations  Bathing;Sit;Bed Mobility;Bend;Squat;Caring for Others;Stairs;Stand;Carry;Toileting;Dressing;Transfers;Hygiene/Grooming;Locomotion Level    Examination-Participation Restrictions  Driving;Other;Meal Prep   running business   Stability/Clinical Decision Making  Evolving/Moderate complexity    Rehab Potential  Good    PT Frequency  Other (comment)   1x/week for 3 treatments and then request more visits with Medicaid   PT Duration  Other (comment)    PT Treatment/Interventions  ADLs/Self Care Home Management;Aquatic Therapy;Biofeedback;Balance training;Therapeutic exercise;Therapeutic activities;Functional mobility training;Wheelchair mobility training;Orthotic Fit/Training;Stair training;Gait training;DME Instruction;Patient/family education;Cognitive remediation;Neuromuscular re-education;Vestibular    PT Next Visit Plan  Check goals and ask for more visits. OT eval scheduled on 01/23/20. Continue gait in bars and trial AD based on LUE stability at shoulder. Initiate strength (in seated position when tolerated based on HA pain in seated position), add flexibility and  balance HEP (with UE support at counter)    Consulted and Agree with Plan of Care  Patient;Family member/caregiver    Family Member Consulted  wife: Melene Muller  Patient will benefit from skilled therapeutic intervention in order to improve the following deficits and impairments:  Abnormal gait, Difficulty walking, Decreased mobility, Decreased balance, Decreased cognition, Decreased range of motion, Decreased coordination, Decreased safety awareness, Impaired flexibility, Postural dysfunction, Decreased knowledge of use of DME, Decreased strength, Impaired UE functional use, Pain, Impaired tone, Impaired sensation, Decreased endurance(PT will monitor pain closely but not directly treat)  Visit Diagnosis: 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 Pancytopenia ([place]) 12/02/2019  . Nephrolithiasis 12/02/2019  . Hydronephrosis with renal and ureteral calculus obstruction 12/02/2019  . Abdominal pain 12/02/2019  . Hyperkalemia 12/02/2019  . Reactive depression   . Wound infection after surgery   . Low grade fever   . Sleep disturbance   . Dysphagia, post-stroke   . Transaminitis   . 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 Cerebral abscess   . Urinary tract infection without hematuria   . Altered mental status   . Primary hypercoagulable state ([place])   . Acute pulmonary embolism without acute cor pulmonale (HCC)   . Deep vein thrombosis (DVT) of non-extremity vein   . Hypokalemia   . FUO (fever of unknown origin)   . Acute blood loss anemia   . SIRS (systemic inflammatory response syndrome) (HCC)   . Leukocytosis   . Endotracheal tube present   . Acute respiratory failure with hypoxemia ([place])   . Stroke (cerebrum) ([place]) 07/19/2019  . Pressure injury of skin 07/19/2019  .  Acute CVA (cerebrovascular accident) ([place])   . Encephalopathy   . Dysphagia   . Acute encephalopathy   . Essential hypertension   . Annual physical exam 09/11/2016  . Obesity 03/12/2016  . PCP NOTES >>>>>>>>>>>>>>>>>. 03/12/2016  . Scalp abscess 12/20/2015  . Neck abscess 12/20/2015  . Pilonidal cyst 02/08/2013    Maurice Fotheringham L 01/06/2020, 3:36 PM  Geoffry Paradise, PT,DPT 01/06/20 3:37 PM Phone: [phone] Fax: [facility] [address] [place], [place], [address] Phone: [phone]   Fax:  [phone]  Name: Matthew Black MRN: [id number] Date of Birth: 11-04-1976

## 2020-01-11 ENCOUNTER — Other Ambulatory Visit: Payer: Self-pay | Admitting: Physical Medicine & Rehabilitation

## 2020-01-11 ENCOUNTER — Other Ambulatory Visit: Payer: Self-pay | Admitting: Physical Medicine and Rehabilitation

## 2020-01-12 ENCOUNTER — Other Ambulatory Visit: Payer: Self-pay | Admitting: Physical Medicine and Rehabilitation

## 2020-01-12 ENCOUNTER — Ambulatory Visit: Payer: Medicaid Other | Admitting: Physical Therapy

## 2020-01-12 DIAGNOSIS — R252 Cramp and spasm: Secondary | ICD-10-CM

## 2020-01-12 DIAGNOSIS — Z7689 Persons encountering health services in other specified circumstances: Secondary | ICD-10-CM | POA: Diagnosis not present

## 2020-01-13 ENCOUNTER — Ambulatory Visit: Payer: Medicaid Other

## 2020-01-13 DIAGNOSIS — Z7689 Persons encountering health services in other specified circumstances: Secondary | ICD-10-CM | POA: Diagnosis not present

## 2020-01-16 ENCOUNTER — Ambulatory Visit (INDEPENDENT_AMBULATORY_CARE_PROVIDER_SITE_OTHER): Payer: Medicaid Other | Admitting: Internal Medicine

## 2020-01-16 DIAGNOSIS — I10 Essential (primary) hypertension: Secondary | ICD-10-CM | POA: Diagnosis not present

## 2020-01-16 DIAGNOSIS — Z13228 Encounter for screening for other metabolic disorders: Secondary | ICD-10-CM | POA: Diagnosis not present

## 2020-01-16 DIAGNOSIS — Z7689 Persons encountering health services in other specified circumstances: Secondary | ICD-10-CM | POA: Diagnosis not present

## 2020-01-16 DIAGNOSIS — Z1211 Encounter for screening for malignant neoplasm of colon: Secondary | ICD-10-CM

## 2020-01-16 DIAGNOSIS — R569 Unspecified convulsions: Secondary | ICD-10-CM | POA: Diagnosis not present

## 2020-01-16 MED ORDER — METOPROLOL TARTRATE 50 MG PO TABS: 50.0000 mg | ORAL_TABLET | Freq: Two times a day (BID) | ORAL | 1 refills | Status: DC

## 2020-01-16 NOTE — Progress Notes (Signed)
Virtual Visit via Telephone Note  I connected with Matthew Black, on 01/16/2020 at 2:33 PM by telephone due to the COVID-19 pandemic and verified that I am speaking with the correct person using two identifiers.   Consent: I discussed the limitations, risks, security and privacy concerns of performing an evaluation and management service by telephone and the availability of in person appointments. I also discussed with the patient that there may be a patient responsible charge related to this service. The patient expressed understanding and agreed to proceed.   Location of Patient: Home   Location of Provider: Home    Persons participating in Telemedicine visit: [facility] Dr. Juleen China      History of Present Illness: Patient has a visit to schedule care and for hospital follow up.   Hospital Course from D/c Summary 11/13: Matthew Black was admitted to rehab 09/06/2019 for inpatient therapies to consist of PT, ST and OT at least three hours five days a week. Past admission physiatrist, therapy team and rehab RN have worked together to provide customized collaborative inpatient rehab.  Pertaining to patient right MCA ACA with right ICA occlusion with cerebral edema status post hemicraniectomy abdominal flap implant [id number] complicated by cerebral abscess with empyema hemorrhagic conversion.  He was followed closely by neurosurgery.  Noted IVC filter placed 07/21/2019 for extensive bilateral lower extremity DVTs he would follow-up with interventional radiology for retrieval removal.  There was no plan for long-term anticoagulation.  Pain managed with use of tramadol as needed schedule baclofen for spasticity Robaxin as needed.  Patient was maintained on Ritalin to help regain focus and attention to task.  Keppra for seizure prophylaxis no seizure activity.  Close monitoring of right scalp wound granulation appeared to be gradually improving advised to keep clean does  not need Betadine avoid irritation if possible.  His diet was slowly advanced to a regular consistency.  In regards to patient's cerebral abscess empyema cerebritis culture showed no growth followed by infectious disease follow-up CT imaging stable it was recommended he can discontinue his antibiotics at time of discharge.  His blood pressures remain controlled he continued on Lopressor.  Patient did have a history of tobacco marijuana use received a full count regards to cessation of illicit drug products marijuana or alcohol.  Hospital Course from D/C Summary 1/8:  Nephrolithiasis with right hydronephrosis -Noted on CT scan -UA unremarkable for infection -Discussed with urology, Dr. Gloriann Loan, who recommended starting Flomax and having patient follow-up in the office in 1 week  Questionable perirectal abscess -CT findings are concerning for perirectal phlegmon -General surgery consulted and appreciated: No concerns for abscess, phlegmon, or infectious processes noted on examination.  No palpable mass or fullness/fluctuance on digital rectal exam.  No recommendations for antibiotics or surgical intervention from surgical standpoint.  Recommended GI follow-up for colonoscopy.  Pancytopenia -Resolved, question if labs were actually accurate -WBC as well as platelets within normal range  Hyperkalemia -Resolved  Essential hypertension -Continue Lopressor  ADD/depression, history of stroke/ICH -There is a patient is no longer taking Celexa or Ritalin or baclofen  History of migraine -Not taking topiramate  Seizure disorder -Continue Keppra   Today's Visit  Monitoring BPs at home. Typically less than [id number] systolic. Asks if needs to continue Metoprolol.  Neurology appointment on Wednesday. On Keppra without seizure activity. Neurologist asked to have PCP check Keppra level.  Patient had a questionable perirectal abscess during hospitalization in January. Gen Surg was consulted and had  no further recommendations for f/u or intervention. Did recommend GI consult for colonoscopy outpatient.    Past Medical History:  Diagnosis Date  . 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 Hydrocodone Nausea Only    Current Outpatient Medications on File Prior to Visit  Medication Sig Dispense Refill  . baclofen (LIORESAL) 10 MG tablet TAKE 1 TABLET BY MOUTH TWICE A DAY 60 tablet 5  . citalopram (CELEXA) 20 MG tablet Take 20 mg by mouth daily.    . famotidine (PEPCID) 20 MG tablet TAKE 1 TABLET BY MOUTH TWICE A DAY 60 tablet 0  . levETIRAcetam (KEPPRA) 500 MG tablet Take 1 tablet (500 mg total) by mouth 2 (two) times daily. 60 tablet 1  . metoprolol tartrate (LOPRESSOR) 50 MG tablet TAKE 1 TABLET BY MOUTH EVERY 8 HOURS. 90 tablet 1  . tamsulosin (FLOMAX) 0.4 MG CAPS capsule Take 1 capsule (0.4 mg total) by mouth daily. 30 capsule 0  . traMADol (ULTRAM) 50 MG tablet Take 1 tablet (50 mg total) by mouth every 6 (six) hours as needed for moderate pain. 30 tablet 0   No current facility-administered medications on file prior to visit.    Observations/Objective: Vitals taken at home: 105/72 HR 88  NAD. Speaking clearly.  Work of breathing normal.  Alert and oriented. Mood appropriate.   Assessment and Plan: 1. Encounter to establish care Needs f/u for annual exam.   2. Seizures (Matthew Black) - Levetiracetam level; Future  3. Essential hypertension BP at goal. Decrease Metoprolol to BID and continue to monitor.  - metoprolol tartrate (LOPRESSOR) 50 MG tablet; Take 1 tablet (50 mg total) by mouth 2 (two) times daily.  Dispense: 90 tablet; Refill: 1  4. Screening for colon cancer - Ambulatory referral to Gastroenterology  5. Screening for metabolic disorder Patient with hyperkalemia during hospitalization, resolved. Also with pancytopenia, resolved. Will repeat labs.  - Basic metabolic panel; Future - CBC; Future   Follow Up Instructions: Return for lab  visit on 2/26; schedule appointment for annual exam    I discussed the assessment and treatment plan with the patient. The patient was provided an opportunity to ask questions and all were answered. The patient agreed with the plan and demonstrated an understanding of the instructions.   The patient was advised to call back or seek an in-person evaluation if the symptoms worsen or if the condition fails to improve as anticipated.     I provided 22 minutes total of non-face-to-face time during this encounter including median intraservice time, reviewing previous notes, investigations, ordering medications, medical decision making, coordinating care and patient verbalized understanding at the end of the visit.    Phill Myron, D.O. Primary [facility] at [facility]  01/16/2020, 2:33 PM

## 2020-01-18 DIAGNOSIS — Z7689 Persons encountering health services in other specified circumstances: Secondary | ICD-10-CM | POA: Diagnosis not present

## 2020-01-18 DIAGNOSIS — M952 Other acquired deformity of head: Secondary | ICD-10-CM | POA: Diagnosis not present

## 2020-01-19 ENCOUNTER — Other Ambulatory Visit: Payer: Self-pay

## 2020-01-19 ENCOUNTER — Ambulatory Visit: Payer: Medicaid Other

## 2020-01-19 DIAGNOSIS — R2681 Unsteadiness on feet: Secondary | ICD-10-CM | POA: Diagnosis not present

## 2020-01-19 DIAGNOSIS — R2689 Other abnormalities of gait and mobility: Secondary | ICD-10-CM

## 2020-01-19 DIAGNOSIS — I69354 Hemiplegia and hemiparesis following cerebral infarction affecting left non-dominant side: Secondary | ICD-10-CM

## 2020-01-19 DIAGNOSIS — M6281 Muscle weakness (generalized): Secondary | ICD-10-CM

## 2020-01-19 DIAGNOSIS — Z7689 Persons encountering health services in other specified circumstances: Secondary | ICD-10-CM | POA: Diagnosis not present

## 2020-01-19 NOTE — Therapy (Signed)
[facility] [address] [place], [place], [address] Phone: [phone]   Fax:  [phone]  Physical Therapy Treatment  Patient Details  Name: Matthew Black MRN: [id number] Date of Birth: 1976-08-19 Referring Provider (PT): Dr. Naaman Plummer but f/u with Dr. Ranell Patrick   Encounter Date: 01/19/2020  PT End of Session - 01/19/20 1324    Visit Number  4    Number of Visits  16    Date for PT Re-Evaluation  03/19/20   after 3 visits   Authorization Type  Medicaid 3 visits approved 12/30/19-01/19/20    Authorization - Visit Number  3    Authorization - Number of Visits  3    PT Start Time  7494    PT Stop Time  1405   7 min unskilled   PT Time Calculation (min)  47 min    Equipment Utilized During Treatment  Gait belt    Activity Tolerance  Patient tolerated treatment well    Behavior During Therapy  Flat affect       Past Medical History:  Diagnosis Date  . 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 CRANIOTOMY Right 07/19/2019   Procedure: RIGHT HEMI-CRANIECTOMY With implantation of skull flap to abdominal wall;  Surgeon: Consuella Lose, MD;  Location: [facility];  Service: Neurosurgery;  Laterality: Right;  . CYST EXCISION N/A 10/08/2016   Procedure: EXCISION OF POSTERIOR NECK CYST;  Surgeon: Clovis Riley, MD;  Location: [facility];  Service: General;  Laterality: N/A;  . INCISION AND DRAINAGE ABSCESS N/A 09/22/2014   Procedure: INCISION AND DRAINAGE ABSCESS POSTERIOR NECK;  Surgeon: Pedro Earls, MD;  Location: [facility];  Service: General;  Laterality: N/A;  . INCISION AND DRAINAGE ABSCESS N/A 12/20/2015   Procedure: INCISION AND DRAINAGE POSTERIOR NECK MASS;  Surgeon: Armandina Gemma, MD;  Location: [facility];  Service: General;  Laterality: N/A;  . INCISION AND DRAINAGE ABSCESS Left 07/10/2004   middle finger  . IR IVC FILTER PLMT / S&I /IMG GUID/MOD SED  07/21/2019  . IR RADIOLOGIST  EVAL & MGMT  12/14/2019  . IR VENOGRAM RENAL UNI RIGHT  07/21/2019  . MASS EXCISION N/A 07/21/2017   Procedure: EXCISION OF BENIGN NECK LESION WITH LAYERED CLOSURE;  Surgeon: Irene Limbo, MD;  Location: [facility];  Service: Plastics;  Laterality: N/A;  . MASS EXCISION N/A 11/10/2017   Procedure: EXCISION BENIGN LESION OF THE NECK WITH LAYERED CLOSURE;  Surgeon: Irene Limbo, MD;  Location: [facility];  Service: Plastics;  Laterality: N/A;    There were no vitals filed for this visit.  Subjective Assessment - 01/19/20 1321    Subjective  Pt reports that he is having a head ache. He had a fall the other day off bsc. Wife found him with top half on bed and kneeling on floor. Fire department came to help him up. Knees are sore. 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  Yes    Pain Score  5     Pain Location  Head    Pain Descriptors / Indicators  Aching;Headache    Pain Type  Chronic pain                       [facility] Adult PT Treatment/Exercise - 01/19/20 1335      Bed Mobility   Bed Mobility  Rolling Right;Supine to Sit;Sit to Supine    Rolling Right  Moderate Assistance - Patient 50-74%    remind to bring left arm across, assist left leg in hooklyin   Supine to Sit  Maximal Assistance - Patient - Patient 25-49%   from sidelying, max assist to get legs off then mod at trunk   Sit to Supine  Moderate Assistance - Patient 50-74%   at left leg     Transfers   Transfers  Sit to Stand;Stand to Sit    Sit to Stand  4: Min guard;4: Min assist    Sit to Stand Details  Verbal cues for technique    Sit to Stand Details (indicate cue type and reason)  Verbal cues to push from chair    Stand to Sit  4: Min guard;4: Min assist    Stand to Sit Details (indicate cue type and reason)  Verbal cues for technique    Stand Pivot Transfers  4: Min assist    Stand Pivot Transfer Details (indicate cue type and reason)  w/c to/from mat      Ambulation/Gait   Ambulation/Gait  Yes    Ambulation/Gait Assistance  4: Min assist    Ambulation/Gait Assistance Details  Pt was cued to tighten left quad and gluts during stance. PT blocked left knee for safety.    Ambulation Distance (Feet)  10 Feet    Assistive device  Parallel bars   w/c follow   Gait Pattern  Step-to pattern;Decreased step length - right;Decreased step length - left;Decreased stance time - left;Decreased dorsiflexion - left;Left flexed knee in stance    Ambulation Surface  Level;Indoor    Pre-Gait Activities  Weight shifting in // bars with right UE support side to side x 10 with verbal and tactile cuing to tighten left quad and gluts when shifting left.      Exercises   Exercises  Other Exercises    Other Exercises   Hooklying bridge x 5 with PT stabilizing at left leg to keep hip in neutral. Utilized bridging to help scoot over on mat to left x 2 with mod assist at shoulders to scoot upper body. Pt had to use bathroom so stopped exercises early.             PT Education - 01/19/20 2040    Education Details  Pt to continue with current HEP with assist of wife. Also instructed to work on standing at counter with wife with  weight shifting with chair behind for safety.    Person(s) Educated  Patient;Spouse    Methods  Explanation;Demonstration    Comprehension  Verbalized understanding       PT Short Term Goals - 12/16/19 1607      PT SHORT  TERM GOAL #1   Title  same as LTGs        PT Long Term Goals - 01/19/20 2043      PT LONG TERM GOAL #1   Title  Pt will perform initial HEP with min  A from wife to improve balance, strength, and flexibility. TARGET DATE FOR ALL LTGS: after 3 visits    Baseline  Pt has started initial strengthening HEP with wife assist.    Status  Partially Met      PT LONG TERM GOAL #2   Title  Pt will perform sit<>stand txfs with min guard to improve functional mobility and safety.    Baseline  min assist wtih sit to stand transfers    Status  On-going      PT LONG TERM GOAL #3   Title  Assess bed mobility and write goal as indicated.    Baseline  bed mobility was assessed and mod/max assist    Status  Achieved      PT LONG TERM GOAL #4   Title  Pt will perform squat pivot txfs to/from w/c to mat with min guard to improve safety during mobility.    Baseline  min assist squat/pivot transfers    Status  On-going      PT LONG TERM GOAL #5   Title  Perform formal balance and gait assessements when appropriate    Baseline  Pt is mod assist with gait 10' in // bars    Status  Achieved      Updated PT goals: PT Short Term Goals - 01/19/20 2059      PT SHORT TERM GOAL #1   Title  Pt will be independent with HEP for strengthening, balance and flexibility with assist of wife to continue gains on own.    Baseline  Pt has started initial strengthening HEP with assist of wife.    Time  4    Period  Weeks    Status  New    Target Date  02/18/20      PT SHORT TERM GOAL #2   Title  Pt will be min assist for bed mobility for improved mobility.    Baseline  mod/max assist with bed mobility    Time  4    Period  Weeks    Status  New    Target Date  02/18/20      PT SHORT  TERM GOAL #3   Title  Pt will perform transfers CGA for improved mobility.    Baseline  min assist with stand/pivot and sit to stand transfers.    Time  4    Period  Weeks    Status  New    Target Date  02/18/20      PT SHORT TERM GOAL #4   Title  Pt will ambulate 20' with hemiwalker versus quad cane mod assist for improved mobility.    Baseline  10' mod asssit in // bars.    Time  4    Period  Weeks    Status  New    Target Date  02/18/20      PT SHORT TERM GOAL #5   Title  Pt will have orthotic fitting for left AFO after trialed in clinic    Baseline  no AFO    Time  4    Period  Weeks    Status  New    Target Date  02/18/20      PT Long  Term Goals - 01/19/20 2105      PT LONG TERM GOAL #1   Title  Pt will be supervision with bed mobility for improved function in home.    Baseline  mod/max assist.    Time  8    Period  Weeks    Status  New    Target Date  03/19/20      PT LONG TERM GOAL #2   Title  Pt will be supervision with transfers for improved mobility.    Baseline  min assist    Time  8    Period  Weeks    Status  New    Target Date  03/19/20      PT LONG TERM GOAL #3   Title  Pt will ambulate >100' with LRAD min assist for improved mobility.    Baseline  10' mod assist in // bars    Time  8    Period  Weeks    Status  New    Target Date  03/19/20      PT LONG TERM GOAL #4   Title  Pt will maintain standing x 5 min with minimal UE support supervision for improved balance and ADLs.    Baseline  min assist for 1-2 min in // bars.    Time  8    Period  Weeks    Status  New    Target Date  03/19/20            Plan - 01/19/20 2046    Clinical Impression Statement  Pt's initial goals checked and met 2/4 goals. Pt's session was limited some as pt had to use restroom during session. Wife assisted. Pt with left hemiparesis and has just started to work on standing/pre-gait activities. Able to walk short distance in // bars mod assist. Decreased weight  shift to left leg.  Pt is currently needing min assist with transfers. Pt will benefit from continued PT to continue to progress strength, balance and functional mobility deficits.    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 08/20/19 w aspiration 08/21/19, (tx empircally w vanco/ cefepime), LLE DVT 07/21/19 and small left lower lung PE 08/01/19 s/p IVC filter 07/21/19, iv heparin discontinued due to worsening ICH 08/14/19, and abdominal wall hematoma, seizure, pseudomonas uti,    Examination-Activity Limitations  Bathing;Sit;Bed Mobility;Bend;Squat;Caring for Others;Stairs;Stand;Carry;Toileting;Dressing;Transfers;Hygiene/Grooming;Locomotion Level    Examination-Participation Restrictions  Driving;Other;Meal Prep   running business   Stability/Clinical Decision Making  Evolving/Moderate complexity    Rehab Potential  Good    PT Frequency  2x / week   followed by 1x/week for 4 weeks   PT Duration  4 weeks    PT Treatment/Interventions  ADLs/Self Care Home Management;Aquatic Therapy;Biofeedback;Balance training;Therapeutic exercise;Therapeutic activities;Functional mobility training;Wheelchair mobility training;Orthotic Fit/Training;Stair training;Gait training;DME Instruction;Patient/family education;Cognitive remediation;Neuromuscular re-education;Vestibular;Manual techniques;Passive range of motion;Cryotherapy;Moist Heat    PT Next Visit Plan  Continue gait in bars and trial AD based on LUE stability at shoulder. Work on [employer] shifting, left LE strengthening, add flexibility and balance HEP (with UE support at counter)    Consulted and Agree with Plan of Care  Patient;Family member/caregiver    Family Member  Consulted  wife: Melene Muller       Patient will benefit from  skilled therapeutic intervention in order to improve the following deficits and impairments:  Abnormal gait, Difficulty walking, Decreased mobility, Decreased balance, Decreased cognition, Decreased range of motion, Decreased coordination, Decreased safety awareness, Impaired flexibility, Postural dysfunction, Decreased knowledge of use of DME, Decreased strength, Impaired UE functional use, Pain, Impaired tone, Impaired sensation, Decreased endurance(PT will monitor pain closely but not directly treat)  Visit Diagnosis: Hemiplegia and hemiparesis following cerebral infarction affecting left non-dominant side (HCC)  Other abnormalities of gait and mobility  Muscle weakness (generalized)     Problem List Patient Active Problem List   Diagnosis Date Noted  . Pancytopenia ([place]) 12/02/2019  . Nephrolithiasis 12/02/2019  . Hydronephrosis with renal and ureteral calculus obstruction 12/02/2019  . Abdominal pain 12/02/2019  . Hyperkalemia 12/02/2019  . Reactive depression   . Wound infection after surgery   . Sleep disturbance   . Dysphagia, post-stroke   . Transaminitis   . 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 Cerebral abscess   . Urinary tract infection without hematuria   . Altered mental status   . Primary hypercoagulable state ([place])   . Acute pulmonary embolism without acute cor pulmonale (HCC)   . Deep vein thrombosis (DVT) of non-extremity vein   . Hypokalemia   . Acute blood loss anemia   . Leukocytosis   . Endotracheal tube present   . Acute respiratory failure with hypoxemia ([place])   . Stroke (cerebrum) ([place]) 07/19/2019  . Pressure injury of skin 07/19/2019  . Acute CVA (cerebrovascular accident) ([place])   . Encephalopathy   . Dysphagia   . Acute encephalopathy   . Essential hypertension   . Obesity 03/12/2016  . Scalp abscess 12/20/2015  . Neck abscess 12/20/2015  . Pilonidal cyst 02/08/2013     Electa Sniff, PT, DPT, NCS 01/19/2020, 8:57 PM  [facility] [address] [place], [place], [address] Phone: [phone]   Fax:  [phone]  Name: ANGELGABRIEL WILLMORE MRN: [id number] Date of Birth: 1976/03/30

## 2020-01-20 ENCOUNTER — Ambulatory Visit (INDEPENDENT_AMBULATORY_CARE_PROVIDER_SITE_OTHER): Payer: Medicaid Other | Admitting: Internal Medicine

## 2020-01-20 ENCOUNTER — Encounter: Payer: Self-pay | Admitting: Internal Medicine

## 2020-01-20 ENCOUNTER — Other Ambulatory Visit: Payer: Medicaid Other

## 2020-01-20 DIAGNOSIS — R569 Unspecified convulsions: Secondary | ICD-10-CM | POA: Diagnosis not present

## 2020-01-20 DIAGNOSIS — L02224 Furuncle of groin: Secondary | ICD-10-CM | POA: Diagnosis not present

## 2020-01-20 DIAGNOSIS — M79605 Pain in left leg: Secondary | ICD-10-CM | POA: Diagnosis not present

## 2020-01-20 DIAGNOSIS — Z13228 Encounter for screening for other metabolic disorders: Secondary | ICD-10-CM | POA: Diagnosis not present

## 2020-01-20 DIAGNOSIS — Z7689 Persons encountering health services in other specified circumstances: Secondary | ICD-10-CM | POA: Diagnosis not present

## 2020-01-20 MED ORDER — DOXYCYCLINE HYCLATE 100 MG PO TABS: 100.0000 mg | ORAL_TABLET | Freq: Two times a day (BID) | ORAL | 0 refills | Status: DC

## 2020-01-20 NOTE — Progress Notes (Signed)
Subjective:    Matthew Black - 44 y.o. male MRN [id number]  Date of birth: 04-May-1976  HPI  Matthew Black is here for lab visit, but wife has some concerns. Patient with hemiplegia and hemiparesis after CVA, wheelchair bound. Concerned about left leg pain after sustaining fall 3 days ago. Wife reports he thinks he was trying to transfer without assistance. Felt into a mattress. No head trauma. Patient reports generalized knee pain across his knee cap and lower quadriceps. No erythema or edema.   Wife is also concerned about something on patient's left testicle. Reports he has a bump that is now open and draining foul smelling liquid. Bump has been present for a few weeks, started draining a few days ago. Surrounding erythema present. No fevers or chills. No nausea or vomiting.     Health Maintenance Due  Topic Date Due  . INFLUENZA VACCINE  06/25/2019    -  reports that he quit smoking about 4 years ago. He smoked 0.00 packs per day. He has never used smokeless tobacco. - Review of Systems: Per HPI. - Past Medical History: Patient Active Problem List   Diagnosis Date Noted  . Pancytopenia ([place]) 12/02/2019  . Nephrolithiasis 12/02/2019  . Hydronephrosis with renal and ureteral calculus obstruction 12/02/2019  . Abdominal pain 12/02/2019  . Hyperkalemia 12/02/2019  . Reactive depression   . Wound infection after surgery   . Sleep disturbance   . Dysphagia, post-stroke   . Transaminitis   . 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 Cerebral abscess   . Urinary tract infection without hematuria   . Altered mental status   . Primary hypercoagulable state ([place])   . Acute pulmonary embolism without acute cor pulmonale (HCC)   . Deep vein thrombosis (DVT) of non-extremity vein   . Hypokalemia   . Acute blood loss anemia   . Leukocytosis   . Endotracheal tube present   . Acute respiratory failure with hypoxemia ([place])   . Stroke (cerebrum) ([place]) 07/19/2019  .  Pressure injury of skin 07/19/2019  . Acute CVA (cerebrovascular accident) ([place])   . Encephalopathy   . Dysphagia   . Acute encephalopathy   . Essential hypertension   . Obesity 03/12/2016  . Scalp abscess 12/20/2015  . Neck abscess 12/20/2015  . Pilonidal cyst 02/08/2013   - Medications: reviewed and updated   Objective:   Physical Exam Physical Exam  Constitutional: He is oriented to person, place, and time and well-developed, well-nourished, and in no distress. No distress.  Cardiovascular: Normal rate.  Pulmonary/Chest: Effort normal. No respiratory distress.  Genitourinary:    Genitourinary Comments: Left testicle with raised area of fluctuance on lateral aspect with surrounding erythema. Open and draining yellow fluid.    Musculoskeletal:     Comments: Left knee without effusion, erythema, increased warmth. No bony abnormality. Negative anterior drawer. No joint line tenderness. Does seem to have some discomfort with both valgus and varus stress testing.   Neurological: He is alert and oriented to person, place, and time.  Wheelchair bound.  Skin: Skin is warm and dry. He is not diaphoretic.  Psychiatric: Affect and judgment normal.           Assessment & Plan:   1. Boil of groin Appears to have a boil on his testicle that is already draining. Will treat with antibiotic and recommended warm compresses. If not improved in next week, needs to f/u.  - doxycycline (VIBRA-TABS) 100 MG tablet; Take 1  tablet (100 mg total) by mouth 2 (two) times daily.  Dispense: 28 tablet; Refill: 0  2. Left leg pain Suspect related to recent fall. No bony abnormality and passive ROM intact so doubt fracture. No joint laxity present. Does have some pain with valgus and varus stress testing which could be concerning for meniscal injury. Given acute nature and recent injury, recommended icing. Patient is non-weight bearing due to hemiparalysis. If pain has not improved in 4-6 weeks would  consider imaging. He is already followed by PT.     Phill Myron, D.O. 01/20/2020, 8:21 PM Primary Care at [facility]

## 2020-01-20 NOTE — Progress Notes (Signed)
Patient here for labs ordered during recent televisit.

## 2020-01-23 ENCOUNTER — Telehealth: Payer: Self-pay | Admitting: Internal Medicine

## 2020-01-23 ENCOUNTER — Ambulatory Visit: Payer: Medicaid Other | Admitting: Occupational Therapy

## 2020-01-23 NOTE — Telephone Encounter (Signed)
Patient requesting a call from Dr Juleen China. Patient broke out in a severe sweat while standing and had a headache (while transferring). Wife laid him back down and he is feeling okay.  Wife would like advice whether or not to make an appointment or go to UC.

## 2020-01-23 NOTE — Telephone Encounter (Signed)
Please advise 

## 2020-01-23 NOTE — Telephone Encounter (Signed)
Left voice mail to call back 

## 2020-01-23 NOTE — Telephone Encounter (Signed)
If the symptoms have resolved and were self limited, it is likely fine to wait for an appointment. However, if it recurs or returns, or if he has new neurological symptoms or chest pain he needs to go to an Urgent Care of ER immediately.   Phill Myron, D.O. Primary Care at [facility]  01/23/2020, 2:40 PM

## 2020-01-24 ENCOUNTER — Other Ambulatory Visit: Payer: Self-pay | Admitting: *Deleted

## 2020-01-24 DIAGNOSIS — I10 Essential (primary) hypertension: Secondary | ICD-10-CM

## 2020-01-24 MED ORDER — METOPROLOL TARTRATE 50 MG PO TABS: 50.0000 mg | ORAL_TABLET | Freq: Two times a day (BID) | ORAL | 0 refills | Status: DC

## 2020-01-26 ENCOUNTER — Encounter: Payer: Self-pay | Admitting: Nurse Practitioner

## 2020-01-27 ENCOUNTER — Ambulatory Visit: Payer: Medicaid Other | Attending: Physician Assistant | Admitting: Occupational Therapy

## 2020-01-27 ENCOUNTER — Other Ambulatory Visit: Payer: Self-pay

## 2020-01-27 ENCOUNTER — Encounter: Payer: Self-pay | Admitting: Occupational Therapy

## 2020-01-27 DIAGNOSIS — I69354 Hemiplegia and hemiparesis following cerebral infarction affecting left non-dominant side: Secondary | ICD-10-CM | POA: Diagnosis not present

## 2020-01-27 DIAGNOSIS — M25512 Pain in left shoulder: Secondary | ICD-10-CM | POA: Diagnosis not present

## 2020-01-27 DIAGNOSIS — R29898 Other symptoms and signs involving the musculoskeletal system: Secondary | ICD-10-CM

## 2020-01-27 DIAGNOSIS — R2681 Unsteadiness on feet: Secondary | ICD-10-CM | POA: Diagnosis not present

## 2020-01-27 DIAGNOSIS — M6281 Muscle weakness (generalized): Secondary | ICD-10-CM

## 2020-01-27 DIAGNOSIS — I69319 Unspecified symptoms and signs involving cognitive functions following cerebral infarction: Secondary | ICD-10-CM | POA: Diagnosis not present

## 2020-01-27 DIAGNOSIS — M25612 Stiffness of left shoulder, not elsewhere classified: Secondary | ICD-10-CM | POA: Diagnosis not present

## 2020-01-27 DIAGNOSIS — G8929 Other chronic pain: Secondary | ICD-10-CM | POA: Diagnosis not present

## 2020-01-27 DIAGNOSIS — R2689 Other abnormalities of gait and mobility: Secondary | ICD-10-CM | POA: Insufficient documentation

## 2020-01-27 DIAGNOSIS — Z7689 Persons encountering health services in other specified circumstances: Secondary | ICD-10-CM | POA: Diagnosis not present

## 2020-01-27 DIAGNOSIS — R209 Unspecified disturbances of skin sensation: Secondary | ICD-10-CM | POA: Insufficient documentation

## 2020-01-27 DIAGNOSIS — R208 Other disturbances of skin sensation: Secondary | ICD-10-CM

## 2020-01-27 NOTE — Therapy (Signed)
[facility] [address] [place], [place], [address] Phone: [phone]   Fax:  [phone]  Occupational Therapy Evaluation  Patient Details  Name: Matthew Black MRN: [id number] Date of Birth: Jun 19, 1976 Referring Provider (OT): Dr. Leeroy Cha   Encounter Date: 01/27/2020  OT End of Session - 01/27/20 1510    Visit Number  1    Number of Visits  11    Date for OT Re-Evaluation  03/19/20    Authorization Type  Medicaid--awaiting authorization    OT Start Time  [id number]    OT Stop Time  1233    OT Time Calculation (min)  48 min    Activity Tolerance  Patient limited by pain   headache, laid head on table and decr response to questions, difficulty sitting upright during eval   Behavior During Therapy  Flat affect       Past Medical History:  Diagnosis Date  . 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 CRANIOTOMY Right 07/19/2019   Procedure: RIGHT HEMI-CRANIECTOMY With implantation of skull flap to abdominal wall;  Surgeon: Consuella Lose, MD;  Location: [facility];  Service: Neurosurgery;  Laterality: Right;  . CYST EXCISION N/A 10/08/2016   Procedure: EXCISION OF POSTERIOR NECK CYST;  Surgeon: Clovis Riley, MD;  [facility];  Service: General;  Laterality: N/A;  . INCISION AND DRAINAGE ABSCESS N/A 09/22/2014   Procedure: INCISION AND DRAINAGE ABSCESS POSTERIOR NECK;  Surgeon: Pedro Earls, MD;  [facility];  Service: General;  Laterality: N/A;  . INCISION AND DRAINAGE ABSCESS N/A 12/20/2015   Procedure: INCISION AND DRAINAGE POSTERIOR NECK MASS;  Surgeon: Armandina Gemma, MD;  [facility];  Service: General;  Laterality: N/A;  . INCISION AND DRAINAGE ABSCESS Left 07/10/2004   middle finger  . IR IVC FILTER PLMT / S&I /IMG GUID/MOD SED  07/21/2019  . IR RADIOLOGIST EVAL & MGMT  12/14/2019  . IR VENOGRAM RENAL UNI RIGHT  07/21/2019  . MASS EXCISION N/A  07/21/2017   Procedure: EXCISION OF BENIGN NECK LESION WITH LAYERED CLOSURE;  Surgeon: Irene Limbo, MD;  Location: [facility];  Service: Plastics;  Laterality: N/A;  . MASS EXCISION N/A 11/10/2017   Procedure: EXCISION BENIGN LESION OF THE NECK WITH LAYERED CLOSURE;  Surgeon: Irene Limbo, MD;  Location: [facility];  Service: Plastics;  Laterality: N/A;    There were no vitals filed for this visit.  Subjective Assessment - 01/27/20 1154    Subjective   Wife reports daily headaches and took tylenol prior to therapy, but can't take other medication prior to therapy due to it making him sleep    Patient is accompanied by:  Family member   wife   Pertinent History  CVA s/p hemicaniectomy 07/19/19 with abdominal flap implant.  PMH: hx of seizures and headaches s/p CVA, hx of DVT with IVC filter placement    Limitations  abdominal flap implant, fall risk, hx of seizures    Patient Stated Goals  move/use LUE    Currently in Pain?  Yes    Pain Score  7     Pain Location  Head    Pain Orientation  Right    Pain Descriptors / Indicators  Aching;Headache    Pain Type  Chronic pain    Pain Onset  More than a month ago    Pain Frequency  Intermittent    Aggravating  Factors   sitting, standing    Pain Relieving Factors  lying down    Pain Score  8    Pain Location  Shoulder    Pain Orientation  Right    Pain Descriptors / Indicators  Aching    Pain Type  Chronic pain    Pain Onset  More than a month ago    Pain Frequency  Constant    Aggravating Factors   movement, pulling out of bed with RUE    Pain Relieving Factors  nothing    Effect of Pain on Daily Activities  OT will monitor (but PT is addressing bed mobility which may help with this)        [facility] OT Assessment - 01/27/20 1158      Assessment   Medical Diagnosis  R MCA/ACA CVA    Referring Provider (OT)  Dr. Leeroy Cha    Onset Date/Surgical Date  07/18/20    Hand Dominance  Right     Prior Therapy  Acute, CIR, and HHPT/OT (2 HH visits per wife)      Precautions   Precautions  Fall;Other (comment)   skull flap in abdomen, hx of seizures     Balance Screen   Has the patient fallen in the past 6 months  Yes    How many times?  1   fell on bed trying to get [facility], fire department came     Jolly expects to be discharged to:  Private residence    Living Arrangements  Spouse/significant other    Lives With  Spouse   20 and 71 y.o. 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     Leisure  [place], hang outside, go to [employer], travel, play with kids/dog      ADL   Eating/Feeding  Set up    Grooming  --   wife does shaving, pt brushes teeth   Upper Body Bathing  Moderate assistance    Lower Body Bathing  Maximal assistance    Upper Body Dressing  Maximal assistance    Lower Body Dressing  Maximal assistance    Toilet Transfer  --   min A-supervision with BSC   Toileting - Clothing Manipulation  Modified independent    Toileting -  Hygiene  Supervision/safety    Tub/Shower Transfer  --   bathing on edge of bed   ADL comments  unable to get w/c in bathroom      IADL   Prior Level of Function Light Housekeeping  wife performed, but pt helped some    Prior Level of Function Meal Prep  able to get snacks out mod I at w/c level now, but would cook occasionally and liked to grill prior to CVA    Community Mobility  Relies on family or friends for transportation    Medication Management  Is not capable of dispensing or managing own medication      Mobility   Mobility Status  --   able to self-propel w/c   Mobility Status Comments  has hemi-walker, takes a few steps holding on to counter with wife holding onto gait belt      Vision Assessment   Vision Assessment  Vision not  tested  Comment  Pt denies difficulty with vision, pt closed eyes and placed head on table for most of session due to headache, will assess further in functional context prn.       Cognition   Overall Cognitive Status  Impaired/Different from baseline    Cognition Comments  Pt reports that he has to read everything 2x to understand it and needs incr time.  Pt demo decr response to questions and needed incr time today to answer questions/respond today.  Wife provided most infomation for eval.  Pt rested head on table with eyes closed for most of session and while answering and only able to sit upright for <71min at a time during session due to headache.      Posture/Postural Control   Posture Comments  At rest, pt slumped over in w/c stating it's from HA pain, sat upright for <33min at a time      Sensation   Light Touch  Impaired by gross assessment    Additional Comments  ? Decr. light touch in LUE/LE.      Tone   Assessment Location  Left Upper Extremity      ROM / Strength   AROM / PROM / Strength  AROM;PROM      AROM   Overall AROM   Deficits    Overall AROM Comments  No active movement LUE      PROM   Overall PROM   Deficits    Overall PROM Comments  LUE grossly WFL except only approx 40* PROM LUE prior to pain; however, pt also limited due to decr time he was able to tolerate sitting upright due to headache today (affecting shoulder positioning)      Hand Function   Left Hand Gross Grasp  Impaired      LUE Tone   LUE Tone  Mild                      OT Education - 01/27/20 1505    Education Details  Proper positioning of LUE in bed and sitting (recommended against use of sling at this time due to no LUE pain reported at rest).  Verbally reviewed hemi-dressing technique for shirt per wife request.  OT eval results/POC    Person(s) Educated  Patient    Methods  Explanation    Comprehension  Verbalized understanding       OT Short Term Goals - 01/27/20  1523      OT SHORT TERM GOAL #1   Title  Pt will perform HEP for ROM with wife assistance prn--check STGs 02/27/20    Baseline  dependent    Time  3    Period  Weeks    Status  New      OT SHORT TERM GOAL #2   Title  Pt will preform UB dressing with min A.    Baseline  max A    Time  3    Period  Weeks    Status  New      OT SHORT TERM GOAL #3   Title  Pt/wife will verbalize understanding of proper positioning of LUE to decr risk of pain and manage spasticity (including updates to splint prn).    Baseline  dependent    Time  3    Period  Weeks    Status  New      OT SHORT TERM GOAL #4   Title  Pt will demo at least 70* L shoulder  flex PROM without pain to assist with ADLs.    Baseline  approx 40* due to pain    Time  3    Period  Weeks    Status  New        OT Long Term Goals - 01/27/20 1527      OT LONG TERM GOAL #1   Title  Pt/wife will be independent with updated HEP.--check LTGs 03/19/20    Baseline  unable    Time  7    Period  Weeks    Status  New      OT LONG TERM GOAL #2   Title  Pt will perform bathing with min A.    Baseline  mod-max A    Time  7    Period  Weeks    Status  New      OT LONG TERM GOAL #3   Title  Pt will demo at least 90* L shoulder flex PROM without pain for incr ease with ADLs    Baseline  40*    Time  7    Period  Weeks    Status  New      OT LONG TERM GOAL #4   Title  Pt will perform LB dressing with min A.    Baseline  max A    Time  7    Period  Weeks    Status  New      OT LONG TERM GOAL #5   Title  Pt will be able to use LUE as stabilizer for selected tasks with min A.    Baseline  unable    Time  7    Period  Weeks    Status  New            Plan - 01/27/20 1512    Clinical Impression Statement  Pt is a 44 y.o. male s/p CVA with R hemicraniectomy and implantation skull flap into abdomen (R side).   Pt hospitalized 07/19/19 to 10/07/19 with CIR and HHPT/OT after d/c from CIR.  Pt was independent prior to CVA,  but now needs assist with ADLs.  Pt presents with L hemiplegia with spasticity, decr ROM, inability to use LUE functionally, decr functional mobility, decr sensation, and cogntive deficits.  Pt would benefit from occupational therapy to address these deficits for incr independence, decr caregiver burden, incr LUE functional use, and improved quality of life.    OT Occupational Profile and History  Detailed Assessment- Review of Records and additional review of physical, cognitive, psychosocial history related to current functional performance    Occupational performance deficits (Please refer to evaluation for details):  ADL's;IADL's;Work;Leisure;Social Participation    Body Structure / Function / Physical Skills  ADL;Dexterity;Balance;ROM;IADL;Mobility;Sensation;Strength;Tone;FMC;Coordination;Decreased knowledge of precautions;Decreased knowledge of use of DME;GMC;Pain;UE functional use    Cognitive Skills  Attention;Energy/Drive;Understand    Rehab Potential  Good    Clinical Decision Making  Several treatment options, min-mod task modification necessary    Comorbidities Affecting Occupational Performance:  Presence of comorbidities impacting occupational performance    Comorbidities impacting occupational performance description:  headaches, severity of deficits, abdominal flap implant, hx of seizures    Modification or Assistance to Complete Evaluation   Min-Moderate modification of tasks or assist with assess necessary to complete eval    OT Duration  --   eval + 2x week for 3 weeks followed by 1x week for 4 weeks (10 visits over 7 weeks)   OT Treatment/Interventions  Self-care/ADL training;DME  and/or AE instruction;Fluidtherapy;Moist Heat;Splinting;Balance training;Therapeutic activities;Aquatic Therapy;Ultrasound;Therapeutic exercise;Cognitive remediation/compensation;Passive range of motion;Functional Mobility Training;Neuromuscular education;Cryotherapy;Manual Therapy;Patient/family  education;Visual/perceptual remediation/compensation;Energy conservation;Electrical Stimulation    Plan  initiate HEP for LUE, review/continue education regarding positioning LUE, check splint if pt brings in, begin to address ADLs as time allows    Consulted and Agree with Plan of Care  Patient       Patient will benefit from skilled therapeutic intervention in order to improve the following deficits and impairments:   Body Structure / Function / Physical Skills: ADL, Dexterity, Balance, ROM, IADL, Mobility, Sensation, Strength, Tone, FMC, Coordination, Decreased knowledge of precautions, Decreased knowledge of use of DME, GMC, Pain, UE functional use Cognitive Skills: Attention, Energy/Drive, Understand     Visit Diagnosis: Hemiplegia and hemiparesis following cerebral infarction affecting left non-dominant side (HCC)  Muscle weakness (generalized)  Unsteadiness on feet  Other disturbances of skin sensation  Unspecified symptoms and signs involving cognitive functions following cerebral infarction  Chronic left shoulder pain  Stiffness of left shoulder, not elsewhere classified  Other symptoms and signs involving the musculoskeletal system    Problem List Patient Active Problem List   Diagnosis Date Noted  . Pancytopenia ([place]) 12/02/2019  . Nephrolithiasis 12/02/2019  . Hydronephrosis with renal and ureteral calculus obstruction 12/02/2019  . Abdominal pain 12/02/2019  . Hyperkalemia 12/02/2019  . Reactive depression   . Wound infection after surgery   . Sleep disturbance   . Dysphagia, post-stroke   . Transaminitis   . 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 Cerebral abscess   . Urinary tract infection without hematuria   . Altered mental status   . Primary hypercoagulable state ([place])   . Acute pulmonary embolism without acute cor pulmonale (HCC)   . Deep vein thrombosis (DVT) of non-extremity vein   . Hypokalemia   . Acute blood loss anemia   .  Leukocytosis   . Endotracheal tube present   . Acute respiratory failure with hypoxemia ([place])   . Stroke (cerebrum) ([place]) 07/19/2019  . Pressure injury of skin 07/19/2019  . Acute CVA (cerebrovascular accident) ([place])   . Encephalopathy   . Dysphagia   . Acute encephalopathy   . Essential hypertension   . Obesity 03/12/2016  . Scalp abscess 12/20/2015  . Neck abscess 12/20/2015  . Pilonidal cyst 02/08/2013    [facility] 01/27/2020, 7:34 PM  [facility] [address] [place], [place], [address] Phone: [phone]   Fax:  [phone]  Name: Matthew Black MRN: [id number] Date of Birth: 1976-06-21   Vianne Bulls, OTR/L [facility] [address] [place], [place]  [address] [phone] phone [phone] 01/27/20

## 2020-01-29 NOTE — Progress Notes (Deleted)
01/29/2020 [name] [id number] 11/15/1976   CHIEF COMPLAINT:   HISTORY OF PRESENT ILLNESS:  Thelmer Klitz. 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   He was admitted to [facility]  12/01/2019 with RLQ abdominal pain,  Pain is an area where the patient had surgery in August of this past year. Patient had a hemorrhagic stroke, requiring craniotomy, and the skull flap is i in this area Labs  mild anemia which is unchanged from baseline, and severe thrombocytopenia with platelet count 28,000.  Thrombocytopenia is new.  CT scan shows a 7 mm calculus in the right mid ureter which accounts for his abdominal pain.    I have seen and examined this patient and agree with the assessment and plan.  Denies any perianal or perirctal pain. He had some RLQ pain where he has bone flap. CT showed question of perianal abscess/collection.  On exam, there is no skin changes or tenderness. Fullness in this region without drainage or tenderness or fluctuance. DRE - no palpable mass or fullness/fluctuance  Does need outpatient follow-up with GI for colonoscopy No evidence of abscess or any pain whatsoever. No fever/chills. No skin changes. This appears to be incidental finding; may have hx of fistula/abscess here that has since resolved Do not see clear reason for abx from perianal standpoint No plans for surgical intervention from our standpoint  Sharon Mt. Dema Severin, M.D.    Questionable perirectal abscess -CT findings are concerning for perirectal phlegmon -General surgery consulted and appreciated: No concerns for abscess, phlegmon, or infectious processes noted on examination.  No palpable mass or fullness/fluctuance on digital rectal exam.  No recommendations for  antibiotics or surgical intervention from surgical standpoint.  Recommended GI follow-up for colonoscopy   CBC Latest Ref Rng & Units 12/02/2019 12/01/2019 10/03/2019  WBC 4.0 - 10.5 K/uL 9.0 3.7(L) 5.7  Hemoglobin 13.0 - 17.0 g/dL 12.6(L) 12.2(L) 11.8(L)  Hematocrit 39.0 - 52.0 % 39.7 36.7(L) 37.2(L)  Platelets 150 - 400 K/uL 184 28(LL) 255   CMP Latest Ref Rng & Units 12/02/2019 12/01/2019 10/07/2019  Glucose 70 - 99 mg/dL 103(H) 109(H) 95  BUN 6 - 20 mg/dL 5(L) 7 15  Creatinine 0.61 - 1.24 mg/dL 0.99 0.96 0.77  Sodium 135 - 145 mmol/L 141 140 139  Potassium 3.5 - 5.1 mmol/L 3.5 5.7(H) 4.0  Chloride 98 - 111 mmol/L 105 105 102  CO2 22 - 32 mmol/L 28 28 27   Calcium 8.9 - 10.3 mg/dL 8.8(L) 8.8(L) 9.3  Total Protein 6.5 - 8.1 g/dL 6.7 6.4(L) -  Total Bilirubin 0.3 - 1.2 mg/dL 0.4 1.6(H) -  Alkaline Phos 38 - 126 U/L 106 108 -  AST 15 - 41 U/L 16 41 -  ALT 0 - 44 U/L 12 19 -           Past Medical History:  Diagnosis Date  . 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 CRANIOTOMY Right 07/19/2019   Procedure: RIGHT HEMI-CRANIECTOMY With implantation of skull flap to abdominal wall;  Surgeon: Consuella Lose, MD;  Location: [facility];  Service: Neurosurgery;  Laterality: Right;  . CYST EXCISION N/A 10/08/2016   Procedure: EXCISION OF POSTERIOR NECK CYST;  Surgeon: Clovis Riley, MD;  [facility];  Service: General;  Laterality: N/A;  . INCISION AND DRAINAGE ABSCESS N/A 09/22/2014   Procedure: INCISION AND DRAINAGE ABSCESS POSTERIOR NECK;  Surgeon: Pedro Earls, MD;  [facility];  Service: General;  Laterality: N/A;  . INCISION AND DRAINAGE ABSCESS N/A 12/20/2015   Procedure: INCISION AND DRAINAGE POSTERIOR NECK MASS;  Surgeon: Armandina Gemma, MD;  [facility];  Service: General;  Laterality: N/A;  . INCISION AND DRAINAGE ABSCESS Left 07/10/2004   middle finger  . IR IVC FILTER PLMT / S&I /IMG GUID/MOD SED  07/21/2019  . IR  RADIOLOGIST EVAL & MGMT  12/14/2019  . IR VENOGRAM RENAL UNI RIGHT  07/21/2019  . MASS EXCISION N/A 07/21/2017   Procedure: EXCISION OF BENIGN NECK LESION WITH LAYERED CLOSURE;  Surgeon: Irene Limbo, MD;  Location: [facility];  Service: Plastics;  Laterality: N/A;  . MASS EXCISION N/A 11/10/2017   Procedure: EXCISION BENIGN LESION OF THE NECK WITH LAYERED CLOSURE;  Surgeon: Irene Limbo, MD;  Location: [facility];  Service: Plastics;  Laterality: N/A;    reports that he quit smoking about 4 years ago. He smoked 0.00 packs per day. He has never used smokeless tobacco. He reports current alcohol use. He reports that he does not use drugs. family history includes Diabetes in an other family member; Healthy in his father and mother. Allergies  Allergen Reactions  . Hydrocodone Nausea Only      Outpatient Encounter Medications as of 01/30/2020  Medication Sig  . baclofen (LIORESAL) 10 MG tablet TAKE 1 TABLET BY MOUTH TWICE A DAY  . citalopram (CELEXA) 20 MG tablet Take 20 mg by mouth daily.  Marland Kitchen doxycycline (VIBRA-TABS) 100 MG tablet Take 1 tablet (100 mg total) by mouth 2 (two) times daily.  . famotidine (PEPCID) 20 MG tablet TAKE 1 TABLET BY MOUTH TWICE A DAY  . levETIRAcetam (KEPPRA) 500 MG tablet Take 1 tablet (500 mg total) by mouth 2 (two) times daily.  . metoprolol tartrate (LOPRESSOR) 50 MG tablet Take 1 tablet (50 mg total) by mouth 2 (two) times daily.  . tamsulosin (FLOMAX) 0.4 MG CAPS capsule Take 1 capsule (0.4 mg total) by mouth daily.  . traMADol (ULTRAM) 50 MG tablet Take 1 tablet (50 mg total) by mouth every 6 (six) hours as needed for moderate pain.   No facility-administered encounter medications on file as of 01/30/2020.     REVIEW OF SYSTEMS: All other systems reviewed and negative except where noted in the History of Present Illness.   PHYSICAL EXAM: There were no vitals taken for this visit. General: Well developed ... in no acute  distress. Head: Normocephalic and atraumatic. Eyes:  Sclerae non-icteric, conjunctive pink. Ears: Normal auditory acuity. Mouth: Dentition intact. No ulcers or lesions.  Neck: Supple, no lymphadenopathy or thyromegaly.  Lungs: Clear bilaterally to auscultation without wheezes, crackles or rhonchi. Heart: Regular rate and rhythm. No murmur, rub or gallop appreciated.  Abdomen: Soft, nontender, non distended. No masses. No hepatosplenomegaly. Normoactive bowel sounds x 4 quadrants.  Rectal:  Musculoskeletal: Symmetrical with no gross deformities. Skin: Warm and dry. No rash or lesions on visible extremities. Extremities: No edema. Neurological: Alert oriented x 4, no focal deficits.  Psychological:  Alert and cooperative. Normal mood and affect.  ASSESSMENT AND PLAN:    CC:  Caryl Never*

## 2020-01-30 ENCOUNTER — Ambulatory Visit: Payer: Medicaid Other | Admitting: Nurse Practitioner

## 2020-01-31 LAB — CBC
Hematocrit: 43.6 % (ref 37.5–51.0)
Hemoglobin: 14.5 g/dL (ref 13.0–17.7)
MCH: 27.2 pg (ref 26.6–33.0)
MCHC: 33.3 g/dL (ref 31.5–35.7)
MCV: 82 fL (ref 79–97)
Platelets: 333 10*3/uL (ref 150–450)
RBC: 5.33 x10E6/uL (ref 4.14–5.80)
RDW: 14.6 % (ref 11.6–15.4)
WBC: 5.9 10*3/uL (ref 3.4–10.8)

## 2020-01-31 LAB — BASIC METABOLIC PANEL
BUN/Creatinine Ratio: 8 — ABNORMAL LOW (ref 9–20)
BUN: 8 mg/dL (ref 6–24)
CO2: 26 mmol/L (ref 20–29)
Calcium: 9.7 mg/dL (ref 8.7–10.2)
Chloride: 104 mmol/L (ref 96–106)
Creatinine, Ser: 0.98 mg/dL (ref 0.76–1.27)
GFR calc Af Amer: 109 mL/min/{1.73_m2} (ref >59)
GFR calc non Af Amer: 95 mL/min/{1.73_m2} (ref >59)
Glucose: 84 mg/dL (ref 65–99)
Potassium: 3.9 mmol/L (ref 3.5–5.2)
Sodium: 144 mmol/L (ref 134–144)

## 2020-01-31 LAB — LEVETIRACETAM LEVEL: Levetiracetam Lvl: 4.2 ug/mL — ABNORMAL LOW (ref 10.0–40.0)

## 2020-01-31 NOTE — Progress Notes (Signed)
Patient & wife notified of results & recommendations. Expressed understanding. States that they will reach out to Neurology in regards to the [place].

## 2020-02-02 ENCOUNTER — Other Ambulatory Visit: Payer: Self-pay

## 2020-02-02 ENCOUNTER — Ambulatory Visit: Payer: Medicaid Other | Admitting: Occupational Therapy

## 2020-02-02 ENCOUNTER — Ambulatory Visit: Payer: Medicaid Other

## 2020-02-02 DIAGNOSIS — Z7689 Persons encountering health services in other specified circumstances: Secondary | ICD-10-CM | POA: Diagnosis not present

## 2020-02-02 DIAGNOSIS — R208 Other disturbances of skin sensation: Secondary | ICD-10-CM

## 2020-02-02 DIAGNOSIS — R2681 Unsteadiness on feet: Secondary | ICD-10-CM | POA: Diagnosis not present

## 2020-02-02 DIAGNOSIS — R209 Unspecified disturbances of skin sensation: Secondary | ICD-10-CM | POA: Diagnosis not present

## 2020-02-02 DIAGNOSIS — I69319 Unspecified symptoms and signs involving cognitive functions following cerebral infarction: Secondary | ICD-10-CM | POA: Diagnosis not present

## 2020-02-02 DIAGNOSIS — M25512 Pain in left shoulder: Secondary | ICD-10-CM | POA: Diagnosis not present

## 2020-02-02 DIAGNOSIS — I69354 Hemiplegia and hemiparesis following cerebral infarction affecting left non-dominant side: Secondary | ICD-10-CM

## 2020-02-02 DIAGNOSIS — M6281 Muscle weakness (generalized): Secondary | ICD-10-CM | POA: Diagnosis not present

## 2020-02-02 DIAGNOSIS — R2689 Other abnormalities of gait and mobility: Secondary | ICD-10-CM | POA: Diagnosis not present

## 2020-02-02 DIAGNOSIS — R29898 Other symptoms and signs involving the musculoskeletal system: Secondary | ICD-10-CM

## 2020-02-02 DIAGNOSIS — G8929 Other chronic pain: Secondary | ICD-10-CM | POA: Diagnosis not present

## 2020-02-02 DIAGNOSIS — M25612 Stiffness of left shoulder, not elsewhere classified: Secondary | ICD-10-CM

## 2020-02-02 NOTE — Therapy (Signed)
[facility] [address] [place], [place], [address] Phone: [phone]   Fax:  [phone]  Occupational Therapy Treatment  Patient Details  Name: Matthew Black MRN: [id number] Date of Birth: [birth year] Referring Provider (OT): Dr. Leeroy Cha   Encounter Date: 02/02/2020  OT End of Session - 02/02/20 1550    Visit Number  2    Number of Visits  11    Date for OT Re-Evaluation  03/19/20    Authorization Type  CCME Approved 9 visits 3/11-4/21/21    Authorization - Visit Number  1    Authorization - Number of Visits  9    OT Start Time  [id number]    OT Stop Time  1530    OT Time Calculation (min)  45 min    Activity Tolerance  Patient tolerated treatment well    Behavior During Therapy  [facility] for tasks assessed/performed       Past Medical History:  Diagnosis Date  . 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 CRANIOTOMY Right 07/19/2019   Procedure: RIGHT HEMI-CRANIECTOMY With implantation of skull flap to abdominal wall;  Surgeon: Consuella Lose, MD;  Location: [facility];  Service: Neurosurgery;  Laterality: Right;  . CYST EXCISION N/A 10/08/2016   Procedure: EXCISION OF POSTERIOR NECK CYST;  Surgeon: Clovis Riley, MD;  [facility];  Service: General;  Laterality: N/A;  . INCISION AND DRAINAGE ABSCESS N/A 09/22/2014   Procedure: INCISION AND DRAINAGE ABSCESS POSTERIOR NECK;  Surgeon: Pedro Earls, MD;  [facility];  Service: General;  Laterality: N/A;  . INCISION AND DRAINAGE ABSCESS N/A 12/20/2015   Procedure: INCISION AND DRAINAGE POSTERIOR NECK MASS;  Surgeon: Armandina Gemma, MD;  [facility];  Service: General;  Laterality: N/A;  . INCISION AND DRAINAGE ABSCESS Left 07/10/2004   middle finger  . IR IVC FILTER PLMT / S&I /IMG GUID/MOD SED  07/21/2019  . IR RADIOLOGIST EVAL & MGMT  12/14/2019  . IR VENOGRAM RENAL UNI RIGHT  07/21/2019  . MASS EXCISION  N/A 07/21/2017   Procedure: EXCISION OF BENIGN NECK LESION WITH LAYERED CLOSURE;  Surgeon: Irene Limbo, MD;  Location: [facility];  Service: Plastics;  Laterality: N/A;  . MASS EXCISION N/A 11/10/2017   Procedure: EXCISION BENIGN LESION OF THE NECK WITH LAYERED CLOSURE;  Surgeon: Irene Limbo, MD;  Location: [facility];  Service: Plastics;  Laterality: N/A;    There were no vitals filed for this visit.  Subjective Assessment - 02/02/20 1541    Subjective   Patient indicates he is interested in working on left arm and leg.    Currently in Pain?  No/denies    Pain Score  0-No pain                   OT Treatments/Exercises (OP) - 02/02/20 0001      ADLs   ADL Comments  Reviewed short and long term goals with patient.  Wife not present for session.        Neurological Re-education Exercises   Other Exercises 1  Neuromuscular reeducation to address postural control.  Patient has little ability to sustain upright sitting.  Sits with extreme flexion and head down.  Patient able to correct with cueing, but cannot sustain for more than a few seconds.  Worked on mechanics of sit to stand, with emphasis on trunk extension with hip flexion.  Patient needs mod to max facilitation to maintain midline orientation.  Once in standing, patient initially with strong right preference - left leg barely in contact with surface.  With facilitation able to align left leg, and have patient shift onto it.  Patient even able to activate briefly hip and knee extension in standing.        Splinting   Splinting  Patient asked to bring prefab splint to next session to determine if it is still meeting his needs.               OT Education - 02/02/20 1550    Education Details  Reviewed short and long term goals    Person(s) Educated  Patient    Methods  Explanation    Comprehension  Need further instruction       OT Short Term Goals - 02/02/20 1448       OT SHORT TERM GOAL #1   Title  Pt will perform HEP for ROM with wife assistance prn--check STGs 02/27/20    Status  On-going      OT SHORT TERM GOAL #2   Title  Pt will preform UB dressing with min A.    Status  On-going      OT SHORT TERM GOAL #3   Title  Pt/wife will verbalize understanding of proper positioning of LUE to decr risk of pain and manage spasticity (including updates to splint prn).    Status  On-going      OT SHORT TERM GOAL #4   Title  Pt will demo at least 70* L shoulder flex PROM without pain to assist with ADLs.    Status  On-going        OT Long Term Goals - 02/02/20 1450      OT LONG TERM GOAL #1   Title  Pt/wife will be independent with updated HEP.--check LTGs 03/19/20    Status  On-going      OT LONG TERM GOAL #2   Title  Pt will perform bathing with min A.    Status  On-going      OT LONG TERM GOAL #3   Title  Pt will demo at least 90* L shoulder flex PROM without pain for incr ease with ADLs    Status  On-going      OT LONG TERM GOAL #4   Title  Pt will perform LB dressing with min A.    Status  On-going      OT LONG TERM GOAL #5   Title  Pt will be able to use LUE as stabilizer for selected tasks with min A.    Status  On-going            Plan - 02/02/20 1556    Clinical Impression Statement  Patient with significant deficits from this stroke, and limited visits available.  Will need to prioritize safety, caregiver burdern, and reduction in pain as primary emphasis.    OT Frequency  2x / week    OT Duration  --   2x/week for 3 weeks than 1x/week for 3 weeks = 9 visits   OT Treatment/Interventions  Self-care/ADL training;DME and/or AE instruction;Fluidtherapy;Moist Heat;Splinting;Balance training;Therapeutic activities;Aquatic Therapy;Ultrasound;Therapeutic exercise;Cognitive remediation/compensation;Passive range of motion;Functional Mobility Training;Neuromuscular education;Cryotherapy;Manual Therapy;Patient/family  education;Visual/perceptual remediation/compensation;Energy conservation;Electrical Stimulation    Plan  Assess splint effectiveness if he brings.  Educate patient/wife HEP for LUE, and address ADL's as time allows    Consulted and Agree with Plan of Care  Patient  Patient will benefit from skilled therapeutic intervention in order to improve the following deficits and impairments:           Visit Diagnosis: Hemiplegia and hemiparesis following cerebral infarction affecting left non-dominant side (HCC)  Unsteadiness on feet  Other disturbances of skin sensation  Muscle weakness (generalized)  Chronic left shoulder pain  Stiffness of left shoulder, not elsewhere classified  Unspecified symptoms and signs involving cognitive functions following cerebral infarction  Other symptoms and signs involving the musculoskeletal system    Problem List Patient Active Problem List   Diagnosis Date Noted  . Pancytopenia ([place]) 12/02/2019  . Nephrolithiasis 12/02/2019  . Hydronephrosis with renal and ureteral calculus obstruction 12/02/2019  . Abdominal pain 12/02/2019  . Hyperkalemia 12/02/2019  . Reactive depression   . Wound infection after surgery   . Sleep disturbance   . Dysphagia, post-stroke   . Transaminitis   . 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 Cerebral abscess   . Urinary tract infection without hematuria   . Altered mental status   . Primary hypercoagulable state ([place])   . Acute pulmonary embolism without acute cor pulmonale (HCC)   . Deep vein thrombosis (DVT) of non-extremity vein   . Hypokalemia   . Acute blood loss anemia   . Leukocytosis   . Endotracheal tube present   . Acute respiratory failure with hypoxemia ([place])   . Stroke (cerebrum) ([place]) 07/19/2019  . Pressure injury of skin 07/19/2019  . Acute CVA (cerebrovascular accident) ([place])   . Encephalopathy   . Dysphagia   . Acute encephalopathy   . Essential hypertension   .  Obesity 03/12/2016  . Scalp abscess 12/20/2015  . Neck abscess 12/20/2015  . Pilonidal cyst 02/08/2013    Mariah Milling, OTR/L 02/02/2020, 4:00 PM  [facility] [address] [place], [place], [address] Phone: [phone]   Fax:  [phone]  Name: HEITH PALOS MRN: [id number] Date of Birth: 1976/01/09

## 2020-02-02 NOTE — Therapy (Addendum)
[facility] [address] [place], [place], [address] Phone: [phone]   Fax:  [phone]  Physical Therapy Treatment  Patient Details  Name: Matthew Black MRN: [id number] Date of Birth: October 11, 1976 Referring Provider (PT): Dr. Naaman Plummer but f/u with Dr. Ranell Patrick   Encounter Date: 02/02/2020  PT End of Session - 02/02/20 1635    Visit Number  5    Number of Visits  16    Date for PT Re-Evaluation  03/19/20    Authorization Type  Medicaid 3 visits approved 12/30/19-01/19/20; Medicaid extension approved for 12 PT visits: 02/02/20-03/28/20    Authorization - Visit Number  1   pt's initial MCD auth for 3 visits met on 01/19/20, new auth for 12 visits   Authorization - Number of Visits  12    PT Start Time  1533   with OT   PT Stop Time  1614    PT Time Calculation (min)  41 min    Equipment Utilized During Treatment  Gait belt    Activity Tolerance  Patient tolerated treatment well    Behavior During Therapy  WFL for tasks assessed/performed       Past Medical History:  Diagnosis Date  . 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 CRANIOTOMY Right 07/19/2019   Procedure: RIGHT HEMI-CRANIECTOMY With implantation of skull flap to abdominal wall;  Surgeon: Consuella Lose, MD;  Location: [facility];  Service: Neurosurgery;  Laterality: Right;  . CYST EXCISION N/A 10/08/2016   Procedure: EXCISION OF POSTERIOR NECK CYST;  Surgeon: Clovis Riley, MD;  [facility];  Service: General;  Laterality: N/A;  . INCISION AND DRAINAGE ABSCESS N/A 09/22/2014   Procedure: INCISION AND DRAINAGE ABSCESS POSTERIOR NECK;  Surgeon: Pedro Earls, MD;  [facility];  Service: General;  Laterality: N/A;  . INCISION AND DRAINAGE ABSCESS N/A 12/20/2015   Procedure: INCISION AND DRAINAGE POSTERIOR NECK MASS;  Surgeon: Armandina Gemma, MD;  [facility];  Service: General;  Laterality: N/A;  .  INCISION AND DRAINAGE ABSCESS Left 07/10/2004   middle finger  . IR IVC FILTER PLMT / S&I /IMG GUID/MOD SED  07/21/2019  . IR RADIOLOGIST EVAL & MGMT  12/14/2019  . IR VENOGRAM RENAL UNI RIGHT  07/21/2019  . MASS EXCISION N/A 07/21/2017   Procedure: EXCISION OF BENIGN NECK LESION WITH LAYERED CLOSURE;  Surgeon: Irene Limbo, MD;  Location: [facility];  Service: Plastics;  Laterality: N/A;  . MASS EXCISION N/A 11/10/2017   Procedure: EXCISION BENIGN LESION OF THE NECK WITH LAYERED CLOSURE;  Surgeon: Irene Limbo, MD;  Location: [facility];  Service: Plastics;  Laterality: N/A;    There were no vitals filed for this visit.  Subjective Assessment - 02/02/20 1537    Subjective  Pt denied falls or changes since last visit. Pt reported he feels good after OT.    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  No/denies                       [facility] Adult PT Treatment/Exercise - 02/02/20 1642      Transfers   Transfers  Sit to Stand;Stand to Sit    Sit to Stand  With upper extremity assist;4: Min guard;4: Min assist    Sit to Stand Details  Verbal cues for technique    Sit to Stand Details (indicate cue type and reason)  x2 reps. Cues to improve ant. weight shifting and sequencing.    Stand Pivot Transfers  4: Min assist    Stand Pivot  Transfer Details (indicate cue type and reason)  mat to w/c after OT session, cues for head/hips relationship-transferred towards L side.       Ambulation/Gait   Ambulation/Gait  Yes    Ambulation/Gait Assistance  4: Min assist    Ambulation/Gait Assistance Details  Pt amb. in // bars with min A to mod A (+2 assist for tech to guard pt's trunk so PT could focus on manual facilitation at hips and LLE). PT trialed amb. in //bars with and without L foot up brace. Cues to improve upright posture, L knee/hip flexion in Black, R step length, heel strike, L hip protraction, and blocked at L knee to prevent buckling. Pt required two rest breaks 2/2 fatigue (muscle spasms) at end of session.    Ambulation Distance (Feet)  10 Feet   12 reps   Assistive device  Parallel bars    Gait Pattern  Step-to pattern;Decreased step length - right;Decreased step length - left;Decreased stance time - left;Decreased dorsiflexion - left;Left flexed knee in stance    Ambulation Surface  Level;Indoor    Pre-Gait Activities  Ant. stepping over 1" foam beam with BLE to improve step length, weight shifting onto LLE, x5reps/LE. pt noted to experience improved 180 degree turns in //bars with L foot up brace donned.              PT Education - 02/02/20 1633    Education Details  PT discussed the foot up brace and that we will trial different AFOs next session to see which are best for pt based on LLE strength.    Person(s) Educated  Patient;Spouse    Methods  Explanation    Comprehension  Verbalized understanding       PT Short Term Goals - 01/19/20 2059      PT SHORT TERM GOAL #1   Title  Pt will be independent with HEP for strengthening, balance and flexibility with assist of wife to continue gains on own.    Baseline  Pt has started initial strengthening HEP with assist of wife.    Time  4    Period  Weeks    Status  New    Target Date  02/18/20      PT SHORT TERM GOAL #2   Title  Pt will be min assist for  bed mobility for improved mobility.    Baseline  mod/max assist with bed mobility    Time  4    Period  Weeks    Status  New    Target Date  02/18/20      PT SHORT TERM GOAL #3   Title  Pt will perform transfers CGA for improved mobility.    Baseline  min assist with stand/pivot and sit to stand transfers.    Time  4    Period  Weeks    Status  New    Target Date  02/18/20      PT SHORT TERM GOAL #4   Title  Pt will ambulate 20' with hemiwalker versus quad cane mod assist for improved mobility.    Baseline  10' mod asssit in // bars.    Time  4    Period  Weeks    Status  New    Target Date  02/18/20      PT SHORT TERM GOAL #5   Title  Pt will have orthotic fitting for left AFO after trialed in clinic    Baseline  no AFO    Time  4    Period  Weeks    Status  New    Target Date  02/18/20        PT Long Term Goals - 01/19/20 2105      PT LONG TERM GOAL #1   Title  Pt will be supervision with bed mobility for improved function in home.    Baseline  mod/max assist.    Time  8    Period  Weeks    Status  New    Target Date  03/19/20      PT LONG TERM GOAL #2   Title  Pt will be supervision with transfers for improved mobility.    Baseline  min assist    Time  8    Period  Weeks    Status  New    Target Date  03/19/20      PT LONG TERM GOAL #3   Title  Pt will ambulate >100' with LRAD min assist for improved mobility.    Baseline  10' mod assist in // bars    Time  8    Period  Weeks    Status  New    Target Date  03/19/20      PT LONG TERM GOAL #4   Title  Pt will maintain standing x 5 min with minimal UE support supervision for improved balance and ADLs.    Baseline  min assist for 1-2 min in // bars.    Time  8    Period  Weeks    Status  New    Target Date  03/19/20            Plan - 02/02/20 1638    Clinical Impression Statement  Pt demonstrated progress, as he tolerated amb. in //bars with only two seated rest breaks. Pt also demonstrated  improve ability to activate L quads during midstance phase of gait. Pt continues to require cues to improve upright posture, L knee/hip flexion during Black phase, step length, L knee ext during midstance and to decr. L foot ER during standing/gait. Pt noted to demonstrate improved L foot clearance with L foot brace donned.PT will trial other AFO next session. Pt would continue to benefit from skilled PT to improve safety during functional mobility.    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 08/20/19 w aspiration 08/21/19, (tx empircally w vanco/ cefepime), LLE DVT 07/21/19 and small left lower lung PE 08/01/19 s/p IVC  filter 07/21/19, iv heparin discontinued due to worsening ICH 08/14/19, and abdominal wall hematoma, seizure, pseudomonas uti,    Examination-Activity Limitations  Bathing;Sit;Bed Mobility;Bend;Squat;Caring for Others;Stairs;Stand;Carry;Toileting;Dressing;Transfers;Hygiene/Grooming;Locomotion Level    Examination-Participation Restrictions  Driving;Other;Meal Prep   running business   Stability/Clinical Decision Making  Evolving/Moderate complexity    Rehab Potential  Good    PT Frequency  2x / week   followed by 1x/week for 4 weeks   PT Duration  4 weeks    PT Treatment/Interventions  ADLs/Self Care Home Management;Aquatic Therapy;Biofeedback;Balance training;Therapeutic exercise;Therapeutic activities;Functional mobility training;Wheelchair mobility training;Orthotic Fit/Training;Stair training;Gait training;DME Instruction;Patient/family education;Cognitive remediation;Neuromuscular re-education;Vestibular;Manual techniques;Passive range of motion;Cryotherapy;Moist Heat    PT Next Visit Plan  Continue  gait in bars and trial AD based on LUE stability at shoulder. Trial AFO vs. foot up brace in // bars. Continue to work on weight shifting, left LE strengthening, add flexibility and balance HEP (with UE support at counter)    Consulted and Agree with Plan of Care  Patient;Family member/caregiver    Family Member Consulted  wife: Melene Muller       Patient will benefit from skilled therapeutic intervention in order to improve the following deficits and impairments:  Abnormal gait, Difficulty walking, Decreased mobility, Decreased balance, Decreased cognition, Decreased range of motion, Decreased coordination, Decreased safety awareness, Impaired flexibility, Postural dysfunction, Decreased knowledge of use of DME, Decreased strength, Impaired UE functional use, Pain, Impaired tone, Impaired sensation, Decreased endurance(PT will monitor pain closely but not directly treat)  Visit Diagnosis: 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 Pancytopenia ([place]) 12/02/2019  . Nephrolithiasis 12/02/2019  . Hydronephrosis with renal and ureteral calculus obstruction 12/02/2019  . Abdominal pain 12/02/2019  . Hyperkalemia 12/02/2019  . Reactive depression   . Wound infection after surgery   . Sleep disturbance   . Dysphagia, post-stroke   . Transaminitis   . 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 Cerebral abscess   . Urinary tract infection without hematuria   . Altered mental status   . Primary hypercoagulable state ([place])   . Acute pulmonary embolism without acute cor pulmonale (HCC)   . Deep vein thrombosis (DVT) of non-extremity vein   . Hypokalemia   . Acute blood loss anemia   . Leukocytosis   . Endotracheal tube present   . Acute respiratory failure with hypoxemia ([place])   . Stroke  (cerebrum) ([place]) 07/19/2019  . Pressure injury of skin 07/19/2019  . Acute CVA (cerebrovascular accident) ([place])   . Encephalopathy   . Dysphagia   . Acute encephalopathy   . Essential hypertension   . Obesity 03/12/2016  . Scalp abscess 12/20/2015  . Neck abscess 12/20/2015  . Pilonidal cyst 02/08/2013    Miller,Jennifer L 02/02/2020, 5:14 PM  Geoffry Paradise, PT,DPT 02/02/20 5:14 PM Phone: [phone] Fax: [facility] [address] [place], [place], [address] Phone: [phone]   Fax:  [phone]  Name: Matthew Black MRN: [id number] Date of Birth: 06-21-1976

## 2020-02-03 ENCOUNTER — Other Ambulatory Visit: Payer: Self-pay

## 2020-02-03 ENCOUNTER — Ambulatory Visit: Payer: Medicaid Other

## 2020-02-03 DIAGNOSIS — M6281 Muscle weakness (generalized): Secondary | ICD-10-CM | POA: Diagnosis not present

## 2020-02-03 DIAGNOSIS — I69354 Hemiplegia and hemiparesis following cerebral infarction affecting left non-dominant side: Secondary | ICD-10-CM | POA: Diagnosis not present

## 2020-02-03 DIAGNOSIS — R2689 Other abnormalities of gait and mobility: Secondary | ICD-10-CM | POA: Diagnosis not present

## 2020-02-03 DIAGNOSIS — Z7689 Persons encountering health services in other specified circumstances: Secondary | ICD-10-CM | POA: Diagnosis not present

## 2020-02-03 DIAGNOSIS — M25512 Pain in left shoulder: Secondary | ICD-10-CM | POA: Diagnosis not present

## 2020-02-03 DIAGNOSIS — R2681 Unsteadiness on feet: Secondary | ICD-10-CM | POA: Diagnosis not present

## 2020-02-03 DIAGNOSIS — R29898 Other symptoms and signs involving the musculoskeletal system: Secondary | ICD-10-CM | POA: Diagnosis not present

## 2020-02-03 DIAGNOSIS — M25612 Stiffness of left shoulder, not elsewhere classified: Secondary | ICD-10-CM | POA: Diagnosis not present

## 2020-02-03 DIAGNOSIS — G8929 Other chronic pain: Secondary | ICD-10-CM | POA: Diagnosis not present

## 2020-02-03 DIAGNOSIS — I69319 Unspecified symptoms and signs involving cognitive functions following cerebral infarction: Secondary | ICD-10-CM | POA: Diagnosis not present

## 2020-02-03 DIAGNOSIS — R209 Unspecified disturbances of skin sensation: Secondary | ICD-10-CM | POA: Diagnosis not present

## 2020-02-03 NOTE — Therapy (Signed)
[facility] [address] [place], [place], [address] Phone: [phone]   Fax:  [phone]  Physical Therapy Treatment  Patient Details  Name: Matthew Black MRN: [id number] Date of Birth: 1976-01-18 Referring Provider (PT): Dr. Naaman Plummer but f/u with Dr. Ranell Patrick   Encounter Date: 02/03/2020  PT End of Session - 02/03/20 1607    Visit Number  6    Number of Visits  16    Date for PT Re-Evaluation  03/19/20    Authorization Type  Medicaid 3 visits approved 12/30/19-01/19/20; Medicaid extension approved for 12 PT visits: 02/02/20-03/28/20    Authorization - Visit Number  2   pt's initial MCD auth for 3 visits met on 01/19/20, new auth for 12 visits (02/02/20-03/28/20   Authorization - Number of Visits  12    PT Start Time  1400    PT Stop Time  1439    PT Time Calculation (min)  39 min    Equipment Utilized During Treatment  Gait belt    Activity Tolerance  Patient limited by pain    Behavior During Therapy  Flat affect       Past Medical History:  Diagnosis Date  . 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 CRANIOTOMY Right 07/19/2019   Procedure: RIGHT HEMI-CRANIECTOMY With implantation of skull flap to abdominal wall;  Surgeon: Consuella Lose, MD;  Location: [facility];  Service: Neurosurgery;  Laterality: Right;  . CYST EXCISION N/A 10/08/2016   Procedure: EXCISION OF POSTERIOR NECK CYST;  Surgeon: Clovis Riley, MD;  Location: [facility];  Service: General;  Laterality: N/A;  . INCISION AND DRAINAGE ABSCESS N/A 09/22/2014   Procedure: INCISION AND DRAINAGE ABSCESS POSTERIOR NECK;  Surgeon: Pedro Earls, MD;  Location: [facility];  Service: General;  Laterality: N/A;  . INCISION AND DRAINAGE ABSCESS N/A 12/20/2015   Procedure: INCISION AND DRAINAGE POSTERIOR NECK MASS;  Surgeon: Armandina Gemma, MD;  Location: [facility];  Service: General;  Laterality: N/A;  . INCISION AND DRAINAGE ABSCESS  Left 07/10/2004   middle finger  . IR IVC FILTER PLMT / S&I /IMG GUID/MOD SED  07/21/2019  . IR RADIOLOGIST EVAL & MGMT  12/14/2019  . IR VENOGRAM RENAL UNI RIGHT  07/21/2019  . MASS EXCISION N/A 07/21/2017   Procedure: EXCISION OF BENIGN NECK LESION WITH LAYERED CLOSURE;  Surgeon: Irene Limbo, MD;  Location: [facility];  Service: Plastics;  Laterality: N/A;  . MASS EXCISION N/A 11/10/2017   Procedure: EXCISION BENIGN LESION OF THE NECK WITH LAYERED CLOSURE;  Surgeon: Irene Limbo, MD;  Location: [facility];  Service: Plastics;  Laterality: N/A;    There were no vitals filed for this visit.  Subjective Assessment - 02/03/20 1405    Subjective  Pt denied falls or changes since last visit. Pt reported he has HA pain today but did not take anything for the pain.    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  Yes    Pain Score  5     Pain Location  Head    Pain Orientation  Right    Pain Descriptors / Indicators  Headache    Pain Type  Chronic pain    Pain Onset  More than a month ago    Pain Frequency  Intermittent    Aggravating Factors   Sitting up    Pain Relieving Factors  Lying down and medication                       [facility] Adult PT Treatment/Exercise - 02/03/20 1446       Ambulation/Gait   Ambulation/Gait  Yes    Ambulation/Gait Assistance  4: Min assist;3: Mod assist    Ambulation/Gait Assistance Details  Pt amb. 2x7' in // bars with L blue rocker donned with min-mod A to maintain balance. Cues and demo to improve L quad and L glute activation to maintain upright posture and extend L knee. Pt notd to have incr. difficulty with activating L quad with blue rocker donned. pt required frequent rest breaks 2/2 fatigue and HA pain.     Ambulation Distance (Feet)  7 Feet   x2   Assistive device  Parallel bars    Gait Pattern  Step-to pattern;Decreased step length - right;Decreased step length - left;Decreased stance time - left;Decreased dorsiflexion - left;Left flexed knee in stance    Ambulation Surface  Level;Indoor    Pre-Gait Activities  In front of mirror for feedback: pt performed lateral weight shifting with emphasis of activating L quads, glutes and scapular retraction for improve upright posture. 2x10 reps. then pt performed anterior steps with cues to improve lateral and ant. weight shifting 2x5 reps with and without L blue rocker donned. LBQC utilized during all gait and pre-gait activities. Frequent rest breaks required 2/2 pain and fatigue.              PT Education - 02/03/20 1602    Education Details  PT discussed the importance of managing HA pain, as pt's wife stated pt declined Tylenol prior to PT visit despite HA pain requiring pt to take more rest breaks during session. PT discussed using foot up brace vs. blue rocker due to pt's L proximal LE weakness. PT educated pt on trialing L quad and glute activation at home at counter, with counter and wife for safety.    Person(s) Educated  Patient;Spouse    Methods  Explanation;Demonstration    Comprehension  Verbalized understanding;Returned demonstration       PT Short Term Goals - 01/19/20 2059      PT SHORT TERM GOAL #1   Title  Pt will be independent with HEP for strengthening, balance and  flexibility with assist of wife to continue gains on own.    Baseline  Pt has started initial strengthening HEP with assist of wife.    Time  4    Period  Weeks    Status  New    Target Date  02/18/20      PT SHORT TERM GOAL #2   Title  Pt will be min assist for bed mobility for improved mobility.    Baseline  mod/max assist with bed mobility    Time  4  Period  Weeks    Status  New    Target Date  02/18/20      PT SHORT TERM GOAL #3   Title  Pt will perform transfers CGA for improved mobility.    Baseline  min assist with stand/pivot and sit to stand transfers.    Time  4    Period  Weeks    Status  New    Target Date  02/18/20      PT SHORT TERM GOAL #4   Title  Pt will ambulate 20' with hemiwalker versus quad cane mod assist for improved mobility.    Baseline  10' mod asssit in // bars.    Time  4    Period  Weeks    Status  New    Target Date  02/18/20      PT SHORT TERM GOAL #5   Title  Pt will have orthotic fitting for left AFO after trialed in clinic    Baseline  no AFO    Time  4    Period  Weeks    Status  New    Target Date  02/18/20        PT Long Term Goals - 01/19/20 2105      PT LONG TERM GOAL #1   Title  Pt will be supervision with bed mobility for improved function in home.    Baseline  mod/max assist.    Time  8    Period  Weeks    Status  New    Target Date  03/19/20      PT LONG TERM GOAL #2   Title  Pt will be supervision with transfers for improved mobility.    Baseline  min assist    Time  8    Period  Weeks    Status  New    Target Date  03/19/20      PT LONG TERM GOAL #3   Title  Pt will ambulate >100' with LRAD min assist for improved mobility.    Baseline  10' mod assist in // bars    Time  8    Period  Weeks    Status  New    Target Date  03/19/20      PT LONG TERM GOAL #4   Title  Pt will maintain standing x 5 min with minimal UE support supervision for improved balance and ADLs.    Baseline  min assist for 1-2 min in  // bars.    Time  8    Period  Weeks    Status  New    Target Date  03/19/20            Plan - 02/03/20 1608    Clinical Impression Statement  Today's session limited today by HA pain, however, pt willing to attempt PT. Pt was able to improve L quads and glute activation with mirror used for feedback, and verbal/tactile cues provided by PT during lateral weight shifting. PT trialed L blue rocker during gait in // bars to improve foot clearnce but proximal weakness of LLE made in difficult to perform L knee/hip flexion, therefore, PT will continue to trial small bouts of gait with L foot-up brace donned. Continue with POC.    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 08/20/19 w aspiration 08/21/19, (tx empircally w vanco/ cefepime), LLE DVT 07/21/19 and small left lower lung PE 08/01/19 s/p IVC filter 07/21/19, iv heparin discontinued due to worsening ICH 08/14/19, and abdominal wall hematoma, seizure, pseudomonas uti,    Examination-Activity Limitations  Bathing;Sit;Bed Mobility;Bend;Squat;Caring for Others;Stairs;Stand;Carry;Toileting;Dressing;Transfers;Hygiene/Grooming;Locomotion Level    Examination-Participation Restrictions  Driving;Other;Meal Prep   running business   Stability/Clinical Decision Making  Evolving/Moderate complexity    Rehab Potential  Good    PT Frequency  2x / week   followed by 1x/week for 4 weeks   PT Duration  4 weeks    PT Treatment/Interventions  ADLs/Self Care Home Management;Aquatic Therapy;Biofeedback;Balance training;Therapeutic exercise;Therapeutic activities;Functional mobility training;Wheelchair mobility training;Orthotic Fit/Training;Stair training;Gait training;DME  Instruction;Patient/family education;Cognitive remediation;Neuromuscular re-education;Vestibular;Manual techniques;Passive range of motion;Cryotherapy;Moist Heat    PT Next Visit Plan  Continue gait in bars and trial AD based on LUE stability at shoulder.  foot up brace in // bars. Continue to work on weight shifting, left LE strengthening, add flexibility and balance HEP (with UE support at counter)    Consulted and Agree with Plan of Care  Patient;Family member/caregiver    Family Member Consulted  wife: Melene Muller       Patient will benefit from skilled therapeutic intervention in order to improve the following deficits and impairments:  Abnormal gait, Difficulty walking, Decreased mobility, Decreased balance, Decreased cognition, Decreased range of motion, Decreased coordination, Decreased safety awareness, Impaired flexibility, Postural dysfunction, Decreased knowledge of use of DME, Decreased strength, Impaired UE functional use, Pain, Impaired tone, Impaired sensation, Decreased endurance(PT will monitor pain closely but not directly treat)  Visit Diagnosis: Hemiplegia and hemiparesis following cerebral infarction affecting left non-dominant side (HCC)  Unsteadiness on feet  Other abnormalities of gait and mobility     Problem List Patient Active Problem List   Diagnosis Date Noted  . Pancytopenia ([facility]) 12/02/2019  . Nephrolithiasis 12/02/2019  . Hydronephrosis with renal and ureteral calculus obstruction 12/02/2019  . Abdominal pain 12/02/2019  . Hyperkalemia 12/02/2019  . Reactive depression   . Wound infection after surgery   . Sleep disturbance   . Dysphagia, post-stroke   . Transaminitis   . 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 Cerebral abscess   . Urinary tract infection without hematuria   . Altered mental status   . Primary hypercoagulable state ([place])   . Acute pulmonary embolism without acute cor pulmonale (HCC)   . Deep vein thrombosis (DVT) of  non-extremity vein   . Hypokalemia   . Acute blood loss anemia   . Leukocytosis   . Endotracheal tube present   . Acute respiratory failure with hypoxemia ([facility])   . Stroke (cerebrum) ([facility]) 07/19/2019  . Pressure injury of skin 07/19/2019  . Acute CVA (cerebrovascular accident) ([facility])   . Encephalopathy   . Dysphagia   . Acute encephalopathy   . Essential hypertension   . Obesity 03/12/2016  . Scalp abscess 12/20/2015  . Neck abscess 12/20/2015  . Pilonidal cyst 02/08/2013    Vaanya Shambaugh L 02/03/2020, 4:13 PM   Geoffry Paradise, PT,DPT 02/03/20 4:13 PM Phone: [phone] Fax: [facility] [address] [place], [place], [address] Phone: [phone]   Fax:  [phone]  Name: IAN CASTAGNA MRN: [id number] Date of Birth: [birth year]

## 2020-02-06 ENCOUNTER — Ambulatory Visit: Payer: Medicaid Other | Admitting: Nurse Practitioner

## 2020-02-06 ENCOUNTER — Telehealth: Payer: Self-pay

## 2020-02-06 NOTE — Telephone Encounter (Signed)
Called patient to do their pre-visit COVID screening.  Call went to voicemail. Unable to do prescreening.  Did note that this was an appointment with the provider & there is only a 10 minute grace period.

## 2020-02-07 ENCOUNTER — Ambulatory Visit: Payer: Medicaid Other

## 2020-02-08 ENCOUNTER — Encounter: Payer: Self-pay | Admitting: Occupational Therapy

## 2020-02-08 ENCOUNTER — Ambulatory Visit: Payer: Medicaid Other

## 2020-02-08 ENCOUNTER — Other Ambulatory Visit: Payer: Self-pay

## 2020-02-08 ENCOUNTER — Ambulatory Visit: Payer: Medicaid Other | Admitting: Occupational Therapy

## 2020-02-08 DIAGNOSIS — R29898 Other symptoms and signs involving the musculoskeletal system: Secondary | ICD-10-CM | POA: Diagnosis not present

## 2020-02-08 DIAGNOSIS — R208 Other disturbances of skin sensation: Secondary | ICD-10-CM

## 2020-02-08 DIAGNOSIS — R2689 Other abnormalities of gait and mobility: Secondary | ICD-10-CM

## 2020-02-08 DIAGNOSIS — M6281 Muscle weakness (generalized): Secondary | ICD-10-CM

## 2020-02-08 DIAGNOSIS — I69354 Hemiplegia and hemiparesis following cerebral infarction affecting left non-dominant side: Secondary | ICD-10-CM | POA: Diagnosis not present

## 2020-02-08 DIAGNOSIS — I69319 Unspecified symptoms and signs involving cognitive functions following cerebral infarction: Secondary | ICD-10-CM | POA: Diagnosis not present

## 2020-02-08 DIAGNOSIS — R2681 Unsteadiness on feet: Secondary | ICD-10-CM

## 2020-02-08 DIAGNOSIS — M25512 Pain in left shoulder: Secondary | ICD-10-CM | POA: Diagnosis not present

## 2020-02-08 DIAGNOSIS — M25612 Stiffness of left shoulder, not elsewhere classified: Secondary | ICD-10-CM | POA: Diagnosis not present

## 2020-02-08 DIAGNOSIS — G8929 Other chronic pain: Secondary | ICD-10-CM

## 2020-02-08 DIAGNOSIS — Z7689 Persons encountering health services in other specified circumstances: Secondary | ICD-10-CM | POA: Diagnosis not present

## 2020-02-08 DIAGNOSIS — R209 Unspecified disturbances of skin sensation: Secondary | ICD-10-CM | POA: Diagnosis not present

## 2020-02-08 NOTE — Therapy (Signed)
[name] [address] [place], [place], [address] Phone: [phone]   Fax:  [phone]  Physical Therapy Treatment  Patient Details  Name: Matthew Black MRN: [id number] Date of Birth: 01-05-1976 Referring Provider (PT): Dr. Naaman Plummer but f/u with Dr. Ranell Patrick   Encounter Date: 02/08/2020  PT End of Session - 02/08/20 1238    Visit Number  7    Number of Visits  16    Date for PT Re-Evaluation  03/19/20    Authorization Type  Medicaid 3 visits approved 12/30/19-01/19/20; Medicaid extension approved for 12 PT visits: 02/02/20-03/28/20    Authorization - Visit Number  3   pt's initial MCD auth for 3 visits met on 01/19/20, new auth for 12 visits (02/02/20-03/28/20   Authorization - Number of Visits  12    PT Start Time  8786    PT Stop Time  1317    PT Time Calculation (min)  42 min    Equipment Utilized During Treatment  Gait belt    Activity Tolerance  Patient limited by pain    Behavior During Therapy  Flat affect       Past Medical History:  Diagnosis Date  . 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 CRANIOTOMY Right 07/19/2019   Procedure: RIGHT HEMI-CRANIECTOMY With implantation of skull flap to abdominal wall;  Surgeon: Consuella Lose, MD;  Location: [facility];  Service: Neurosurgery;  Laterality: Right;  . CYST EXCISION N/A 10/08/2016   Procedure: EXCISION OF POSTERIOR NECK CYST;  Surgeon: Clovis Riley, MD;  Location: [facility];  Service: General;  Laterality: N/A;  . INCISION AND DRAINAGE ABSCESS N/A 09/22/2014   Procedure: INCISION AND DRAINAGE ABSCESS POSTERIOR NECK;  Surgeon: Pedro Earls, MD;  Location: [facility];  Service: General;  Laterality: N/A;  . INCISION AND DRAINAGE ABSCESS N/A 12/20/2015   Procedure: INCISION AND DRAINAGE POSTERIOR NECK MASS;  Surgeon: Armandina Gemma, MD;  Location: [facility];  Service: General;  Laterality: N/A;  . INCISION AND DRAINAGE ABSCESS  Left 07/10/2004   middle finger  . IR IVC FILTER PLMT / S&I /IMG GUID/MOD SED  07/21/2019  . IR RADIOLOGIST EVAL & MGMT  12/14/2019  . IR VENOGRAM RENAL UNI RIGHT  07/21/2019  . MASS EXCISION N/A 07/21/2017   Procedure: EXCISION OF BENIGN NECK LESION WITH LAYERED CLOSURE;  Surgeon: Irene Limbo, MD;  Location: [facility];  Service: Plastics;  Laterality: N/A;  . MASS EXCISION N/A 11/10/2017   Procedure: EXCISION BENIGN LESION OF THE NECK WITH LAYERED CLOSURE;  Surgeon: Irene Limbo, MD;  Location: [facility];  Service: Plastics;  Laterality: N/A;    There were no vitals filed for this visit.  Subjective Assessment - 02/08/20 2030    Subjective  Pt more alert upon arrival. Wife reports they did take tylenol. Pt denied any pain.    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  No/denies    Pain Onset  More than a month ago                       [facility] Adult PT Treatment/Exercise - 02/08/20 1239      Transfers   Transfers  Sit to Stand;Stand to Sit;Squat Pivot Transfers    Sit to Stand  4: Min assist    Sit to Stand Details  Verbal cues for technique;Manual facilitation for weight shifting    Stand to Sit  4: Min assist    Squat Pivot Transfers  4: Min guard    Squat Pivot Transfer  Details (indicate cue type and reason)  w/c to/from mat      Ambulation/Gait   Ambulation/Gait  Yes    Ambulation/Gait Assistance  4: Min assist    Ambulation/Gait Assistance Details  In // bars with left thuasane AFO donned. Pt was cued to try to shift over left leg more with PT staying close incase he buckled.     Ambulation Distance (Feet)  24 Feet   walked back and forth including turns.   Assistive device  Parallel bars    Gait Pattern  Step-to pattern;Decreased weight shift to left;Decreased hip/knee flexion - left;Decreased step length - right    Ambulation Surface  Level;Indoor    Gait Comments  Pt ambulated 20' and 25' with [facility] mod assist with left AFO donned. Rehab tech standing by for safety. Pt using trunk to help with advancing left leg. PT providing tactile cue at left knee to watch for buckling. Pt was instructed to try to take larger right step to help shift weight more to left leg. BP=118/89 after gait      Neuro Re-ed    Neuro Re-ed Details   In // bars: weight shifting without AFO with PT blocking at left knee to prevent buckling. Verbal cues to tighten gluts ans try to activate quad with weight shift. Also had mirror in front for visual cues. Then donned AFO and performed again. Less buckling at left knee with AFO donned. Standing at mat with left AFO stepping forwards and back with right foot to increase left weight shift with PT stabilizing knee mod assist.             PT Education - 02/08/20 2029    Education Details  To continue with current HEP    Person(s) Educated  Patient    Methods  Explanation    Comprehension  Verbalized understanding       PT Short Term Goals - 01/19/20 2059      PT SHORT TERM GOAL #1   Title  Pt will be independent with HEP for strengthening, balance and flexibility with assist of wife to continue gains on own.    Baseline  Pt has started initial strengthening HEP with assist of wife.    Time  4    Period  Weeks    Status  New     Target Date  02/18/20      PT SHORT TERM GOAL #2   Title  Pt will be min assist for bed mobility for improved mobility.    Baseline  mod/max assist with bed mobility    Time  4    Period  Weeks    Status  New  Target Date  02/18/20      PT SHORT TERM GOAL #3   Title  Pt will perform transfers CGA for improved mobility.    Baseline  min assist with stand/pivot and sit to stand transfers.    Time  4    Period  Weeks    Status  New    Target Date  02/18/20      PT SHORT TERM GOAL #4   Title  Pt will ambulate 20' with hemiwalker versus quad cane mod assist for improved mobility.    Baseline  10' mod asssit in // bars.    Time  4    Period  Weeks    Status  New    Target Date  02/18/20      PT SHORT TERM GOAL #5   Title  Pt will have orthotic fitting for left AFO after trialed in clinic    Baseline  no AFO    Time  4    Period  Weeks    Status  New    Target Date  02/18/20        PT Long Term Goals - 01/19/20 2105      PT LONG TERM GOAL #1   Title  Pt will be supervision with bed mobility for improved function in home.    Baseline  mod/max assist.    Time  8    Period  Weeks    Status  New    Target Date  03/19/20      PT LONG TERM GOAL #2   Title  Pt will be supervision with transfers for improved mobility.    Baseline  min assist    Time  8    Period  Weeks    Status  New    Target Date  03/19/20      PT LONG TERM GOAL #3   Title  Pt will ambulate >100' with LRAD min assist for improved mobility.    Baseline  10' mod assist in // bars    Time  8    Period  Weeks    Status  New    Target Date  03/19/20      PT LONG TERM GOAL #4   Title  Pt will maintain standing x 5 min with minimal UE support supervision for improved balance and ADLs.    Baseline  min assist for 1-2 min in // bars.    Time  8    Period  Weeks    Status  New    Target Date  03/19/20            Plan - 02/08/20 2033    Clinical Impression Statement  PT continued gait in //  bars with left AFO with anterior support Fabio Asa). AFO provided just enough support to decrease buckling but pt still has decreased weight shift left. Pt able to advance left LE but utilizing trunk to help. Trialed gait with [facility] outside bars and able to perform mod assist short distance. Pt will need AFO for more support of left leg.    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 08/20/19 w aspiration 08/21/19, (tx empircally w vanco/ cefepime), LLE DVT 07/21/19 and small left lower lung PE  08/01/19 s/p IVC filter 07/21/19, iv heparin discontinued due to worsening ICH 08/14/19, and abdominal wall hematoma, seizure, pseudomonas uti,    Examination-Activity Limitations  Bathing;Sit;Bed Mobility;Bend;Squat;Caring for Others;Stairs;Stand;Carry;Toileting;Dressing;Transfers;Hygiene/Grooming;Locomotion Level    Examination-Participation Restrictions  Driving;Other;Meal Prep   running business   Stability/Clinical Decision Making  Evolving/Moderate complexity    Rehab Potential  Good    PT Frequency  2x / week   followed by 1x/week for 4 weeks   PT Duration  4 weeks    PT Treatment/Interventions  ADLs/Self Care Home Management;Aquatic Therapy;Biofeedback;Balance training;Therapeutic exercise;Therapeutic activities;Functional mobility training;Wheelchair mobility training;Orthotic Fit/Training;Stair training;Gait training;DME Instruction;Patient/family education;Cognitive remediation;Neuromuscular re-education;Vestibular;Manual techniques;Passive range of motion;Cryotherapy;Moist Heat    PT Next Visit Plan  Continue gait with [facility] and left AFO (anterior thuasne) or walker with left hand grip? to see if will help with weight  shift.  Needs AFO. Has one been ordered? Continue to work on weight shifting, left LE strengthening, add flexibility and balance HEP (with UE support at counter)    Consulted and Agree with Plan of Care  Patient;Family member/caregiver    Family Member Consulted  wife: Melene Muller       Patient will benefit from skilled therapeutic intervention in order to improve the following deficits and impairments:  Abnormal gait, Difficulty walking, Decreased mobility, Decreased balance, Decreased cognition, Decreased range of motion, Decreased coordination, Decreased safety awareness, Impaired flexibility, Postural dysfunction, Decreased knowledge of use of DME, Decreased strength, Impaired UE functional use, Pain, Impaired tone, Impaired sensation, Decreased endurance(PT will monitor pain closely but not directly treat)  Visit Diagnosis: Other abnormalities of gait and mobility  Muscle weakness (generalized)     Problem List Patient Active Problem List   Diagnosis Date Noted  . Pancytopenia ([facility]) 12/02/2019  . Nephrolithiasis 12/02/2019  . Hydronephrosis with renal and ureteral calculus obstruction 12/02/2019  . Abdominal pain 12/02/2019  . Hyperkalemia 12/02/2019  . Reactive depression   . Wound infection after surgery   . Sleep disturbance   . Dysphagia, post-stroke   . Transaminitis   . 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 Cerebral abscess   . Urinary tract infection without hematuria   . Altered mental status   . Primary hypercoagulable state ([place])   . Acute pulmonary embolism without acute cor pulmonale (HCC)   . Deep vein thrombosis (DVT) of non-extremity vein   . Hypokalemia   . Acute blood loss anemia   . Leukocytosis   . Endotracheal tube present   . Acute respiratory failure with hypoxemia ([place])   . Stroke (cerebrum) ([place]) 07/19/2019  . Pressure injury of skin 07/19/2019  . Acute CVA (cerebrovascular accident) ([facility])   . Encephalopathy   . Dysphagia   .  Acute encephalopathy   . Essential hypertension   . Obesity 03/12/2016  . Scalp abscess 12/20/2015  . Neck abscess 12/20/2015  . Pilonidal cyst 02/08/2013    Electa Sniff, PT, DPT, NCS 02/08/2020, 8:40 PM  [facility] [address] [place], [place], [address] Phone: [phone]   Fax:  [phone]  Name: Matthew Black MRN: [id number] Date of Birth: 01/01/1976

## 2020-02-08 NOTE — Therapy (Signed)
[facility] [address] [place], [place], [address] Phone: [phone]   Fax:  [phone]  Occupational Therapy Treatment  Patient Details  Name: Matthew Black MRN: [id number] Date of Birth: [birth year] Referring Provider (OT): Dr. Leeroy Cha   Encounter Date: 02/08/2020  OT End of Session - 02/08/20 1604    Visit Number  3    Number of Visits  9    Date for OT Re-Evaluation  03/14/20    Authorization Type  CCME Approved 9 visits 3/11-4/21/21    Authorization - Visit Number  2    Authorization - Number of Visits  9    OT Start Time  0115    OT Stop Time  0200    OT Time Calculation (min)  45 min    Activity Tolerance  Patient tolerated treatment well    Behavior During Therapy  [facility] for tasks assessed/performed       Past Medical History:  Diagnosis Date  . 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 CRANIOTOMY Right 07/19/2019   Procedure: RIGHT HEMI-CRANIECTOMY With implantation of skull flap to abdominal wall;  Surgeon: Consuella Lose, MD;  Location: [facility];  Service: Neurosurgery;  Laterality: Right;  . CYST EXCISION N/A 10/08/2016   Procedure: EXCISION OF POSTERIOR NECK CYST;  Surgeon: Clovis Riley, MD;  Location: [facility];  Service: General;  Laterality: N/A;  . INCISION AND DRAINAGE ABSCESS N/A 09/22/2014   Procedure: INCISION AND DRAINAGE ABSCESS POSTERIOR NECK;  Surgeon: Pedro Earls, MD;  Location: [facility];  Service: General;  Laterality: N/A;  . INCISION AND DRAINAGE ABSCESS N/A 12/20/2015   Procedure: INCISION AND DRAINAGE POSTERIOR NECK MASS;  Surgeon: Armandina Gemma, MD;  Location[facility];  Service: General;  Laterality: N/A;  . INCISION AND DRAINAGE ABSCESS Left 07/10/2004   middle finger  . IR IVC FILTER PLMT / S&I /IMG GUID/MOD SED  07/21/2019  . IR RADIOLOGIST EVAL & MGMT  12/14/2019  . IR VENOGRAM RENAL UNI RIGHT  07/21/2019  . MASS EXCISION  N/A 07/21/2017   Procedure: EXCISION OF BENIGN NECK LESION WITH LAYERED CLOSURE;  Surgeon: Irene Limbo, MD;  Location: [facility];  Service: Plastics;  Laterality: N/A;  . MASS EXCISION N/A 11/10/2017   Procedure: EXCISION BENIGN LESION OF THE NECK WITH LAYERED CLOSURE;  Surgeon: Irene Limbo, MD;  Location: [facility];  Service: Plastics;  Laterality: N/A;    There were no vitals filed for this visit.  Subjective Assessment - 02/08/20 1501    Subjective   Patient indicates fatigue after PT session    Currently in Pain?  No/denies    Pain Score  0-No pain                   OT Treatments/Exercises (OP) - 02/08/20 0001      Neurological Re-education Exercises   Other Exercises 1  Neuromuscular reeducation to address postural control for upright sitting .  Patient able to recruit trunk extensors, but had difficulty sustaining that contraction - needing frequent cueing and facilitation.  Worked on weight shifting toward left as patient maintains weight strongly to right side.  Worked on more dynamic weight shifts outside of base of support.  Patient with very limited movement repertoire.  Worked to orient to midline for sit toi stand transition.  Patient able to stand uprigth for several seconds with only intermittent contact and frequent  cueing to activate extension in left leg.      Other Exercises 2  Worked with left arm on tilted surface (tall stool) to address motor activation. Patient with decreased attention to left hand - but responded well to cueing to look toward left hand and sit upright.  Wife present for session today - very encouraging to patient.        Splinting   Splinting  Patient brought in resting splint which he wears at night.  SPlint fits well, and wife reports no problems, and hand looser in am than without.  Continue splint               OT Short Term Goals - 02/02/20 1448      OT SHORT TERM GOAL #1   Title   Pt will perform HEP for ROM with wife assistance prn--check STGs 02/27/20    Status  On-going      OT SHORT TERM GOAL #2   Title  Pt will preform UB dressing with min A.    Status  On-going      OT SHORT TERM GOAL #3   Title  Pt/wife will verbalize understanding of proper positioning of LUE to decr risk of pain and manage spasticity (including updates to splint prn).    Status  On-going      OT SHORT TERM GOAL #4   Title  Pt will demo at least 70* L shoulder flex PROM without pain to assist with ADLs.    Status  On-going        OT Long Term Goals - 02/02/20 1450      OT LONG TERM GOAL #1   Title  Pt/wife will be independent with updated HEP.--check LTGs 03/19/20    Status  On-going      OT LONG TERM GOAL #2   Title  Pt will perform bathing with min A.    Status  On-going      OT LONG TERM GOAL #3   Title  Pt will demo at least 90* L shoulder flex PROM without pain for incr ease with ADLs    Status  On-going      OT LONG TERM GOAL #4   Title  Pt will perform LB dressing with min A.    Status  On-going      OT LONG TERM GOAL #5   Title  Pt will be able to use LUE as stabilizer for selected tasks with min A.    Status  On-going            Plan - 02/08/20 1605    Clinical Impression Statement  Patient responding well to midline orientation to assist with transitional movements.  Patient with improved upright sitting posture and baance this session.  Patient limited by frequent headache.    OT Frequency  2x / week    OT Treatment/Interventions  Self-care/ADL training;DME and/or AE instruction;Fluidtherapy;Moist Heat;Splinting;Balance training;Therapeutic activities;Aquatic Therapy;Ultrasound;Therapeutic exercise;Cognitive remediation/compensation;Passive range of motion;Functional Mobility Training;Neuromuscular education;Cryotherapy;Manual Therapy;Patient/family education;Visual/perceptual remediation/compensation;Energy conservation;Electrical Stimulation    Plan  Educate  patient/wife HEP for LUE, and address ADL's as time allows       Patient will benefit from skilled therapeutic intervention in order to improve the following deficits and impairments:           Visit Diagnosis: Hemiplegia and hemiparesis following cerebral infarction affecting left non-dominant side (HCC)  Muscle weakness (generalized)  Unsteadiness on feet  Other disturbances of skin sensation  Chronic left shoulder pain  Stiffness of left shoulder, not elsewhere classified    Problem List Patient Active Problem List   Diagnosis Date Noted  . Pancytopenia ([place]) 12/02/2019  . Nephrolithiasis 12/02/2019  . Hydronephrosis with renal and ureteral calculus obstruction 12/02/2019  . Abdominal pain 12/02/2019  . Hyperkalemia 12/02/2019  . Reactive depression   . Wound infection after surgery   . Sleep disturbance   . Dysphagia, post-stroke   . Transaminitis   . 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 Cerebral abscess   . Urinary tract infection without hematuria   . Altered mental status   . Primary hypercoagulable state ([place])   . Acute pulmonary embolism without acute cor pulmonale (HCC)   . Deep vein thrombosis (DVT) of non-extremity vein   . Hypokalemia   . Acute blood loss anemia   . Leukocytosis   . Endotracheal tube present   . Acute respiratory failure with hypoxemia ([place])   . Stroke (cerebrum) ([place]) 07/19/2019  . Pressure injury of skin 07/19/2019  . Acute CVA (cerebrovascular accident) ([place])   . Encephalopathy   . Dysphagia   . Acute encephalopathy   . Essential hypertension   . Obesity 03/12/2016  . Scalp abscess 12/20/2015  . Neck abscess 12/20/2015  . Pilonidal cyst 02/08/2013    Mariah Milling, OTR/L 02/08/2020, 4:08 PM  [facility] [address] [place], [place], [address] Phone: [phone]   Fax:  [phone]  Name: Matthew Black MRN: [id number] Date of  Birth: 22-Aug-1976

## 2020-02-09 ENCOUNTER — Ambulatory Visit: Payer: Medicaid Other | Admitting: Occupational Therapy

## 2020-02-09 ENCOUNTER — Encounter: Payer: Self-pay | Admitting: Occupational Therapy

## 2020-02-09 DIAGNOSIS — R2689 Other abnormalities of gait and mobility: Secondary | ICD-10-CM | POA: Diagnosis not present

## 2020-02-09 DIAGNOSIS — G8929 Other chronic pain: Secondary | ICD-10-CM

## 2020-02-09 DIAGNOSIS — M25612 Stiffness of left shoulder, not elsewhere classified: Secondary | ICD-10-CM | POA: Diagnosis not present

## 2020-02-09 DIAGNOSIS — Z7689 Persons encountering health services in other specified circumstances: Secondary | ICD-10-CM | POA: Diagnosis not present

## 2020-02-09 DIAGNOSIS — M6281 Muscle weakness (generalized): Secondary | ICD-10-CM

## 2020-02-09 DIAGNOSIS — R209 Unspecified disturbances of skin sensation: Secondary | ICD-10-CM | POA: Diagnosis not present

## 2020-02-09 DIAGNOSIS — R2681 Unsteadiness on feet: Secondary | ICD-10-CM

## 2020-02-09 DIAGNOSIS — R208 Other disturbances of skin sensation: Secondary | ICD-10-CM

## 2020-02-09 DIAGNOSIS — R29898 Other symptoms and signs involving the musculoskeletal system: Secondary | ICD-10-CM | POA: Diagnosis not present

## 2020-02-09 DIAGNOSIS — I69319 Unspecified symptoms and signs involving cognitive functions following cerebral infarction: Secondary | ICD-10-CM | POA: Diagnosis not present

## 2020-02-09 DIAGNOSIS — I69354 Hemiplegia and hemiparesis following cerebral infarction affecting left non-dominant side: Secondary | ICD-10-CM | POA: Diagnosis not present

## 2020-02-09 DIAGNOSIS — M25512 Pain in left shoulder: Secondary | ICD-10-CM | POA: Diagnosis not present

## 2020-02-09 NOTE — Therapy (Signed)
[facility] [address] [place], [place], [address] Phone: [phone]   Fax:  [phone]  Occupational Therapy Treatment  Patient Details  Name: Matthew Black MRN: [id number] Date of Birth: Apr 11, 1976 Referring Provider (OT): Dr. Leeroy Cha   Encounter Date: 02/09/2020  OT End of Session - 02/09/20 1602    Visit Number  4    Number of Visits  9    Date for OT Re-Evaluation  03/14/20    Authorization Type  CCME Approved 9 visits 3/11-4/21/21    Authorization - Visit Number  3    OT Start Time  1400    OT Stop Time  1445    OT Time Calculation (min)  45 min    Activity Tolerance  Patient tolerated treatment well    Behavior During Therapy  [facility] for tasks assessed/performed       Past Medical History:  Diagnosis Date  . 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 CRANIOTOMY Right 07/19/2019   Procedure: RIGHT HEMI-CRANIECTOMY With implantation of skull flap to abdominal wall;  Surgeon: Consuella Lose, MD;  Location: [facility];  Service: Neurosurgery;  Laterality: Right;  . CYST EXCISION N/A 10/08/2016   Procedure: EXCISION OF POSTERIOR NECK CYST;  Surgeon: Clovis Riley, MD;  [facility];  Service: General;  Laterality: N/A;  . INCISION AND DRAINAGE ABSCESS N/A 09/22/2014   Procedure: INCISION AND DRAINAGE ABSCESS POSTERIOR NECK;  Surgeon: Pedro Earls, MD;  [facility];  Service: General;  Laterality: N/A;  . INCISION AND DRAINAGE ABSCESS N/A 12/20/2015   Procedure: INCISION AND DRAINAGE POSTERIOR NECK MASS;  Surgeon: Armandina Gemma, MD;  [facility];  Service: General;  Laterality: N/A;  . INCISION AND DRAINAGE ABSCESS Left 07/10/2004   middle finger  . IR IVC FILTER PLMT / S&I /IMG GUID/MOD SED  07/21/2019  . IR RADIOLOGIST EVAL & MGMT  12/14/2019  . IR VENOGRAM RENAL UNI RIGHT  07/21/2019  . MASS EXCISION N/A 07/21/2017   Procedure: EXCISION OF  BENIGN NECK LESION WITH LAYERED CLOSURE;  Surgeon: Irene Limbo, MD;  Location: [facility];  Service: Plastics;  Laterality: N/A;  . MASS EXCISION N/A 11/10/2017   Procedure: EXCISION BENIGN LESION OF THE NECK WITH LAYERED CLOSURE;  Surgeon: Irene Limbo, MD;  Location: [facility];  Service: Plastics;  Laterality: N/A;    There were no vitals filed for this visit.  Subjective Assessment - 02/09/20 1556    Subjective   Hey!  I am good.    Patient is accompanied by:  Family member    Currently in Pain?  No/denies    Pain Score  0-No pain                   OT Treatments/Exercises (OP) - 02/09/20 0001      Neurological Re-education Exercises   Other Exercises 1  Educated wife in additional stretch to left shoulder to promote external rotation.  Patient with painful left shoulder - and has limited passive ER.  Wife dem'd safe and effective performance to stretch gently in supine.      Other Exercises 2  Neuromuscular reeducation to address LUE pain.  In supine, facilitated elbow flexion and extension. Patient's responses significantly delayed.  Spoke with wife and patient about whether or not he was prescribed a stimulant.  Wife said he has a prescription for ritalin, but he refuses  to take it.  When asked patient indicated that the medication makes him "buzz"  Spoke to patient and wife about potential benefit of a stimulant to help reduce his reaction time - and told them it may be benficial to speak with MD about dosage.  Patient unable to tolerate any aspect of sidelying on right side.  Reporting pain n crease of shoulder.               OT Education - 02/09/20 1602    Education Details  Reviewed benefit of stimulant medication    Person(s) Educated  Patient;Spouse    Methods  Explanation    Comprehension  Need further instruction;Verbalized understanding       OT Short Term Goals - 02/02/20 1448      OT SHORT TERM GOAL #1    Title  Pt will perform HEP for ROM with wife assistance prn--check STGs 02/27/20    Status  On-going      OT SHORT TERM GOAL #2   Title  Pt will preform UB dressing with min A.    Status  On-going      OT SHORT TERM GOAL #3   Title  Pt/wife will verbalize understanding of proper positioning of LUE to decr risk of pain and manage spasticity (including updates to splint prn).    Status  On-going      OT SHORT TERM GOAL #4   Title  Pt will demo at least 70* L shoulder flex PROM without pain to assist with ADLs.    Status  On-going        OT Long Term Goals - 02/02/20 1450      OT LONG TERM GOAL #1   Title  Pt/wife will be independent with updated HEP.--check LTGs 03/19/20    Status  On-going      OT LONG TERM GOAL #2   Title  Pt will perform bathing with min A.    Status  On-going      OT LONG TERM GOAL #3   Title  Pt will demo at least 90* L shoulder flex PROM without pain for incr ease with ADLs    Status  On-going      OT LONG TERM GOAL #4   Title  Pt will perform LB dressing with min A.    Status  On-going      OT LONG TERM GOAL #5   Title  Pt will be able to use LUE as stabilizer for selected tasks with min A.    Status  On-going            Plan - 02/09/20 1603    Clinical Impression Statement  Patient showing some carryover of upright postural control with gentle reminders this session.  Wife very supportive.    OT Frequency  2x / week    OT Duration  --   9 visits   OT Treatment/Interventions  Self-care/ADL training;DME and/or AE instruction;Fluidtherapy;Moist Heat;Splinting;Balance training;Therapeutic activities;Aquatic Therapy;Ultrasound;Therapeutic exercise;Cognitive remediation/compensation;Passive range of motion;Functional Mobility Training;Neuromuscular education;Cryotherapy;Manual Therapy;Patient/family education;Visual/perceptual remediation/compensation;Energy conservation;Electrical Stimulation    Plan  Educate patient/wife HEP for LUE, and address  ADL's as time allows    Consulted and Agree with Plan of Care  Patient;Family member/caregiver    Family Member Consulted  wife       Patient will benefit from skilled therapeutic intervention in order to improve the following deficits and impairments:           Visit Diagnosis: Hemiplegia and hemiparesis  following cerebral infarction affecting left non-dominant side (HCC)  Muscle weakness (generalized)  Unsteadiness on feet  Other disturbances of skin sensation  Chronic left shoulder pain  Stiffness of left shoulder, not elsewhere classified    Problem List Patient Active Problem List   Diagnosis Date Noted  . Pancytopenia ([place]) 12/02/2019  . Nephrolithiasis 12/02/2019  . Hydronephrosis with renal and ureteral calculus obstruction 12/02/2019  . Abdominal pain 12/02/2019  . Hyperkalemia 12/02/2019  . Reactive depression   . Wound infection after surgery   . Sleep disturbance   . Dysphagia, post-stroke   . Transaminitis   . 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 Cerebral abscess   . Urinary tract infection without hematuria   . Altered mental status   . Primary hypercoagulable state ([place])   . Acute pulmonary embolism without acute cor pulmonale (HCC)   . Deep vein thrombosis (DVT) of non-extremity vein   . Hypokalemia   . Acute blood loss anemia   . Leukocytosis   . Endotracheal tube present   . Acute respiratory failure with hypoxemia ([place])   . Stroke (cerebrum) ([place]) 07/19/2019  . Pressure injury of skin 07/19/2019  . Acute CVA (cerebrovascular accident) ([place])   . Encephalopathy   . Dysphagia   . Acute encephalopathy   . Essential hypertension   . Obesity 03/12/2016  . Scalp abscess 12/20/2015  . Neck abscess 12/20/2015  . Pilonidal cyst 02/08/2013    Mariah Milling, OTR/L 02/09/2020, 4:05 PM  [facility] [address] [place], [place], [address] Phone: [phone]    Fax:  [phone]  Name: DRAKE HUTCHINGS MRN: [id number] Date of Birth: 1976-06-27

## 2020-02-14 ENCOUNTER — Other Ambulatory Visit: Payer: Self-pay | Admitting: Physical Medicine and Rehabilitation

## 2020-02-14 ENCOUNTER — Encounter: Payer: Self-pay | Admitting: Gastroenterology

## 2020-02-14 ENCOUNTER — Ambulatory Visit (INDEPENDENT_AMBULATORY_CARE_PROVIDER_SITE_OTHER): Payer: Medicaid Other | Admitting: Gastroenterology

## 2020-02-14 VITALS — BP 110/78 | HR 64 | Temp 98.7°F

## 2020-02-14 DIAGNOSIS — K6289 Other specified diseases of anus and rectum: Secondary | ICD-10-CM | POA: Diagnosis not present

## 2020-02-14 DIAGNOSIS — R935 Abnormal findings on diagnostic imaging of other abdominal regions, including retroperitoneum: Secondary | ICD-10-CM

## 2020-02-14 DIAGNOSIS — K611 Rectal abscess: Secondary | ICD-10-CM

## 2020-02-14 NOTE — Telephone Encounter (Signed)
Recieved electronic medication refill request for Pepcid.  No mention of medication in previous note.  Unsure if ok to refill.

## 2020-02-14 NOTE — Progress Notes (Signed)
02/14/2020 ESPERANZA GOOSMAN [id number] [birth year]   HISTORY OF PRESENT ILLNESS: This is a 44 year old male who is new to our office.  He has been referred here by his PCP, Dr. Phill Myron, for evaluation regarding abnormal CT scan imaging of the rectum.  Patient had a CVA in August requiring craniotomy.  Still has skull implant in the subcutaneous tissue of his abdomen.  Had left lower quadrant DVT and a small left lower lung PE status post IVC filter since anticoagulation could not be used.  Has left-sided paralysis and is in a wheelchair.  Apparently he presented to the emergency department in January with complaints of right lower quadrant abdominal pain.  He had a CT scan of the abdomen/pelvis with contrast that showed the following:  Mild rectal thickening with intersphincteric hazy stranding, and an ill-defined region of phlegmon or possible developing perirectal abscess extending within the ischioanal fossa and along the left gluteal cleft.   Surgery saw him during that admission and upon exam they did not see any evidence of abscess.  Did not recommend continuing any antibiotic therapy.  They recommended colonoscopy.  He is here today with his wife.  They report some constipation just since all these medical issues and being the fact that he has been wheelchair-bound, etc.  He has had one episode of rectal bleeding upon wiping.  No abnormal drainage.  He denies abdominal pain, but does report pain to the left side of his buttock/rectum.  Appetite is good.  Past Medical History:  Diagnosis Date  . 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 Depression   . Hemorrhagic stroke ([place])   . Kidney stones    Past Surgical History:  Procedure Laterality Date  . CRANIOTOMY Right 07/19/2019   Procedure: RIGHT HEMI-CRANIECTOMY With implantation of skull flap to abdominal wall;  Surgeon: Consuella Lose, MD;  Location: [facility];  Service: Neurosurgery;  Laterality: Right;  . CYST EXCISION N/A  10/08/2016   Procedure: EXCISION OF POSTERIOR NECK CYST;  Surgeon: Clovis Riley, MD;  [facility];  Service: General;  Laterality: N/A;  . INCISION AND DRAINAGE ABSCESS N/A 09/22/2014   Procedure: INCISION AND DRAINAGE ABSCESS POSTERIOR NECK;  Surgeon: Pedro Earls, MD;  [facility];  Service: General;  Laterality: N/A;  . INCISION AND DRAINAGE ABSCESS N/A 12/20/2015   Procedure: INCISION AND DRAINAGE POSTERIOR NECK MASS;  Surgeon: Armandina Gemma, MD;  [facility];  Service: General;  Laterality: N/A;  . INCISION AND DRAINAGE ABSCESS Left 07/10/2004   middle finger  . IR IVC FILTER PLMT / S&I /IMG GUID/MOD SED  07/21/2019  . IR RADIOLOGIST EVAL & MGMT  12/14/2019  . IR VENOGRAM RENAL UNI RIGHT  07/21/2019  . MASS EXCISION N/A 07/21/2017   Procedure: EXCISION OF BENIGN NECK LESION WITH LAYERED CLOSURE;  Surgeon: Irene Limbo, MD;  Location: [facility];  Service: Plastics;  Laterality: N/A;  . MASS EXCISION N/A 11/10/2017   Procedure: EXCISION BENIGN LESION OF THE NECK WITH LAYERED CLOSURE;  Surgeon: Irene Limbo, MD;  Location: [facility];  Service: Plastics;  Laterality: N/A;    reports that he quit smoking about 4 years ago. He smoked 0.00 packs per day. He has never used smokeless tobacco. He reports previous alcohol use. He reports that he does not use drugs. family history includes Clotting disorder in his maternal aunt, maternal grandmother, and maternal uncle; Diabetes in his paternal grandfather; Healthy in his father and mother. Allergies  Allergen Reactions  . Hydrocodone Nausea Only      Outpatient Encounter Medications as of 02/14/2020  Medication Sig  . acetaminophen (TYLENOL) 325 MG tablet Take 650 mg by mouth every 6 (six) hours as needed.  . baclofen (LIORESAL) 10 MG tablet TAKE 1 TABLET BY MOUTH TWICE A DAY  . famotidine (PEPCID) 20 MG tablet TAKE 1 TABLET BY MOUTH TWICE A DAY  . metoprolol tartrate (LOPRESSOR) 50 MG  tablet Take 1 tablet (50 mg total) by mouth 2 (two) times daily.  . [DISCONTINUED] citalopram (CELEXA) 20 MG tablet Take 20 mg by mouth daily.  . [DISCONTINUED] doxycycline (VIBRA-TABS) 100 MG tablet Take 1 tablet (100 mg total) by mouth 2 (two) times daily.  . [DISCONTINUED] famotidine (PEPCID) 20 MG tablet TAKE 1 TABLET BY MOUTH TWICE A DAY  . [DISCONTINUED] levETIRAcetam (KEPPRA) 500 MG tablet Take 1 tablet (500 mg total) by mouth 2 (two) times daily.  . [DISCONTINUED] tamsulosin (FLOMAX) 0.4 MG CAPS capsule Take 1 capsule (0.4 mg total) by mouth daily.  . [DISCONTINUED] traMADol (ULTRAM) 50 MG tablet Take 1 tablet (50 mg total) by mouth every 6 (six) hours as needed for moderate pain.   No facility-administered encounter medications on file as of 02/14/2020.     REVIEW OF SYSTEMS  : All other systems reviewed and negative except where noted in the History of Present Illness.   PHYSICAL EXAM: BP 110/78   Pulse 64   Temp 98.7 F (37.1 C)  General: Well developed black male in no acute distress; in wheelchair Head:  Hood over head to cover abnormality from craniotomy. Eyes:  Sclerae anicteric, conjunctiva pink. Ears: Normal auditory acuity Lungs: Clear throughout to auscultation; no increased WOB. Heart: Regular rate and rhythm; no M/R/G. Abdomen: Soft, non-distended.  BS present.  Non-tender.  Bone flap implant noted in right lower quadrant. Musculoskeletal: Symmetrical with no gross deformities  Skin: No lesions on visible extremities Extremities: No edema  Neurological: Alert oriented x 4, grossly non-focal Psychological:  Alert and cooperative. Normal mood and affect  ASSESSMENT AND PLAN: *44 year old male here for evaluation regarding an abnormal CT scan showing concern for mild rectal thickening and possible developing perirectal abscess on CT scan that was performed on January 8.  Reports 1 episode of rectal bleeding upon wiping, no drainage.  Reports that sided rectal  discomfort.  We discussed that he would be extremely high risk for colonoscopy at this point.  He had a CVA requiring craniotomy in August.  Still has skull implant in subcutaneous tissue of his abdomen.  It has been 2-1/2 months since that imaging.  I think that we should start by proceeding with repeat imaging study.  We will plan for MRI of the pelvis with contrast to reassess.  Pending those findings would discuss need for colonoscopy versus flexible sigmoidoscopy.   CC:  Caryl Never*

## 2020-02-14 NOTE — Patient Instructions (Signed)
If you are age 42 or older, your body mass index should be between 23-30. Your There is no height or weight on file to calculate BMI. If this is out of the aforementioned range listed, please consider follow up with your Primary Care Provider.  If you are age 20 or younger, your body mass index should be between 19-25. Your There is no height or weight on file to calculate BMI. If this is out of the aformentioned range listed, please consider follow up with your Primary Care Provider.    You have been scheduled for an MRI at [facility] on Monday 02/27/20. Your appointment time is 3 pm. Please arrive 15 minutes prior to your appointment time for registration purposes. Please make certain not to have anything to eat or drink 6 hours prior to your test. In addition, if you have any metal in your body, have a pacemaker or defibrillator, please be sure to let your ordering physician know. This test typically takes 45 minutes to 1 hour to complete. Should you need to reschedule, please call [phone] to do so.

## 2020-02-15 ENCOUNTER — Encounter: Payer: Self-pay | Admitting: Occupational Therapy

## 2020-02-15 ENCOUNTER — Ambulatory Visit: Payer: Medicaid Other | Admitting: Occupational Therapy

## 2020-02-15 ENCOUNTER — Telehealth: Payer: Self-pay

## 2020-02-15 ENCOUNTER — Ambulatory Visit: Payer: Medicaid Other

## 2020-02-15 ENCOUNTER — Other Ambulatory Visit: Payer: Self-pay

## 2020-02-15 DIAGNOSIS — M6281 Muscle weakness (generalized): Secondary | ICD-10-CM

## 2020-02-15 DIAGNOSIS — R2689 Other abnormalities of gait and mobility: Secondary | ICD-10-CM

## 2020-02-15 DIAGNOSIS — R2681 Unsteadiness on feet: Secondary | ICD-10-CM

## 2020-02-15 DIAGNOSIS — M25512 Pain in left shoulder: Secondary | ICD-10-CM | POA: Diagnosis not present

## 2020-02-15 DIAGNOSIS — G8929 Other chronic pain: Secondary | ICD-10-CM

## 2020-02-15 DIAGNOSIS — M25612 Stiffness of left shoulder, not elsewhere classified: Secondary | ICD-10-CM

## 2020-02-15 DIAGNOSIS — R209 Unspecified disturbances of skin sensation: Secondary | ICD-10-CM | POA: Diagnosis not present

## 2020-02-15 DIAGNOSIS — R208 Other disturbances of skin sensation: Secondary | ICD-10-CM

## 2020-02-15 DIAGNOSIS — I69354 Hemiplegia and hemiparesis following cerebral infarction affecting left non-dominant side: Secondary | ICD-10-CM | POA: Diagnosis not present

## 2020-02-15 DIAGNOSIS — Z7689 Persons encountering health services in other specified circumstances: Secondary | ICD-10-CM | POA: Diagnosis not present

## 2020-02-15 DIAGNOSIS — I69319 Unspecified symptoms and signs involving cognitive functions following cerebral infarction: Secondary | ICD-10-CM | POA: Diagnosis not present

## 2020-02-15 DIAGNOSIS — R29898 Other symptoms and signs involving the musculoskeletal system: Secondary | ICD-10-CM | POA: Diagnosis not present

## 2020-02-15 NOTE — Therapy (Signed)
[facility] [address] [place], [place], [address] Phone: [phone]   Fax:  [phone]  Occupational Therapy Treatment  Patient Details  Name: Matthew Black MRN: [id number] Date of Birth: 1976/05/19 Referring Provider (OT): Dr. Leeroy Cha   Encounter Date: 02/15/2020  OT End of Session - 02/15/20 1509    Visit Number  5    Number of Visits  10    Date for OT Re-Evaluation  03/14/20    Authorization Type  CCME Approved 9 visits 3/11-4/21/21    Authorization - Visit Number  4    Authorization - Number of Visits  9    OT Start Time  [id number]    OT Stop Time  1400    OT Time Calculation (min)  45 min    Activity Tolerance  Patient tolerated treatment well    Behavior During Therapy  [facility] for tasks assessed/performed       Past Medical History:  Diagnosis Date  . 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 Depression   . Hemorrhagic stroke ([place])   . Kidney stones     Past Surgical History:  Procedure Laterality Date  . CRANIOTOMY Right 07/19/2019   Procedure: RIGHT HEMI-CRANIECTOMY With implantation of skull flap to abdominal wall;  Surgeon: Consuella Lose, MD;  Location: [facility];  Service: Neurosurgery;  Laterality: Right;  . CYST EXCISION N/A 10/08/2016   Procedure: EXCISION OF POSTERIOR NECK CYST;  Surgeon: Clovis Riley, MD;  [facility];  Service: General;  Laterality: N/A;  . INCISION AND DRAINAGE ABSCESS N/A 09/22/2014   Procedure: INCISION AND DRAINAGE ABSCESS POSTERIOR NECK;  Surgeon: Pedro Earls, MD;  [facility];  Service: General;  Laterality: N/A;  . INCISION AND DRAINAGE ABSCESS N/A 12/20/2015   Procedure: INCISION AND DRAINAGE POSTERIOR NECK MASS;  Surgeon: Armandina Gemma, MD;  [facility];  Service: General;  Laterality: N/A;  . INCISION AND DRAINAGE ABSCESS Left 07/10/2004   middle finger  . IR IVC FILTER PLMT / S&I /IMG GUID/MOD SED  07/21/2019  . IR RADIOLOGIST EVAL & MGMT   12/14/2019  . IR VENOGRAM RENAL UNI RIGHT  07/21/2019  . MASS EXCISION N/A 07/21/2017   Procedure: EXCISION OF BENIGN NECK LESION WITH LAYERED CLOSURE;  Surgeon: Irene Limbo, MD;  Location: [facility];  Service: Plastics;  Laterality: N/A;  . MASS EXCISION N/A 11/10/2017   Procedure: EXCISION BENIGN LESION OF THE NECK WITH LAYERED CLOSURE;  Surgeon: Irene Limbo, MD;  Location: [facility];  Service: Plastics;  Laterality: N/A;    There were no vitals filed for this visit.  Subjective Assessment - 02/15/20 1319    Subjective   I am raising up in bed on my own now    Currently in Pain?  No/denies    Pain Score  0-No pain                   OT Treatments/Exercises (OP) - 02/15/20 0001      Neurological Re-education Exercises   Other Exercises 1  Neuromuscular reeducation to address patient's shoulder pain.  Patient able for the first time to roll toward left side.  Patient responded well to cue to release head down onto pillow, and noticed immediate relief of shoulder pain symptoms.  Shared with wife at end of session (unable to demonstrate)     Other Exercises 2  Neuromuscular reeducation to address LUE movement while lying on left side -  visual attention. Patient with little volitional movement in LUE.  Worked on functional transitions needed for ADL.  Patient limited in LB dressing.  Worked on forward weight shifting in sitting, sit to stand transition, and static to dynamic standing as needed for LB dressing.                 OT Short Term Goals - 02/15/20 1511      OT SHORT TERM GOAL #1   Title  Pt will perform HEP for ROM with wife assistance prn--check STGs 02/27/20    Baseline  dependent    Time  3    Status  On-going      OT SHORT TERM GOAL #2   Title  Pt will preform UB dressing with min A.    Baseline  max A    Time  3    Period  Weeks    Status  Achieved      OT SHORT TERM GOAL #3   Title  Pt/wife will verbalize  understanding of proper positioning of LUE to decr risk of pain and manage spasticity (including updates to splint prn).    Baseline  dependent    Time  3    Period  Weeks    Status  Achieved      OT SHORT TERM GOAL #4   Title  Pt will demo at least 70* L shoulder flex PROM without pain to assist with ADLs.    Baseline  approx 40* due to pain    Period  Weeks    Status  Achieved        OT Long Term Goals - 02/02/20 1450      OT LONG TERM GOAL #1   Title  Pt/wife will be independent with updated HEP.--check LTGs 03/19/20    Status  On-going      OT LONG TERM GOAL #2   Title  Pt will perform bathing with min A.    Status  On-going      OT LONG TERM GOAL #3   Title  Pt will demo at least 90* L shoulder flex PROM without pain for incr ease with ADLs    Status  On-going      OT LONG TERM GOAL #4   Title  Pt will perform LB dressing with min A.    Status  On-going      OT LONG TERM GOAL #5   Title  Pt will be able to use LUE as stabilizer for selected tasks with min A.    Status  On-going            Plan - 02/15/20 1509    Clinical Impression Statement  Patient with improved sustained upright trunk control.  Patient with decreased shoulder pain overall.    OT Frequency  2x / week    OT Treatment/Interventions  Self-care/ADL training;DME and/or AE instruction;Fluidtherapy;Moist Heat;Splinting;Balance training;Therapeutic activities;Aquatic Therapy;Ultrasound;Therapeutic exercise;Cognitive remediation/compensation;Passive range of motion;Functional Mobility Training;Neuromuscular education;Cryotherapy;Manual Therapy;Patient/family education;Visual/perceptual remediation/compensation;Energy conservation;Electrical Stimulation    Plan  Continue with HEP as wife available, LB ADL    Consulted and Agree with Plan of Care  Patient       Patient will benefit from skilled therapeutic intervention in order to improve the following deficits and impairments:           Visit  Diagnosis: Hemiplegia and hemiparesis following cerebral infarction affecting left non-dominant side (HCC)  Chronic left shoulder pain  Other disturbances of skin sensation  Unsteadiness on  feet  Muscle weakness (generalized)  Stiffness of left shoulder, not elsewhere classified    Problem List Patient Active Problem List   Diagnosis Date Noted  . Peri-rectal abscess 02/14/2020  . Abnormal CT scan, pelvis 02/14/2020  . Pancytopenia ([place]) 12/02/2019  . Nephrolithiasis 12/02/2019  . Hydronephrosis with renal and ureteral calculus obstruction 12/02/2019  . Rectal pain 12/02/2019  . Hyperkalemia 12/02/2019  . Reactive depression   . Wound infection after surgery   . Sleep disturbance   . Dysphagia, post-stroke   . Transaminitis   . 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 Cerebral abscess   . Urinary tract infection without hematuria   . Altered mental status   . Primary hypercoagulable state ([place])   . Acute pulmonary embolism without acute cor pulmonale (HCC)   . Deep vein thrombosis (DVT) of non-extremity vein   . Hypokalemia   . Acute blood loss anemia   . Leukocytosis   . Endotracheal tube present   . Acute respiratory failure with hypoxemia ([place])   . Stroke (cerebrum) ([place]) 07/19/2019  . Pressure injury of skin 07/19/2019  . Acute CVA (cerebrovascular accident) ([place])   . Encephalopathy   . Dysphagia   . Acute encephalopathy   . Essential hypertension   . Obesity 03/12/2016  . Scalp abscess 12/20/2015  . Neck abscess 12/20/2015  . Pilonidal cyst 02/08/2013    Mariah Milling, OTR/L 02/15/2020, 3:12 PM  [facility] [address] [place], [place], [address] Phone: [phone]   Fax:  [phone]  Name: DAWYNE DOTEN MRN: [id number] Date of Birth: 08/28/1976

## 2020-02-15 NOTE — Telephone Encounter (Signed)
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  He will benefit from use of left AFO in order to improve safety with functional mobility.    If you agree, please submit request in EPIC under MD Order, Other Orders (list left AFO in comments) or fax to [facility] at [phone].   Thank you, Cherly Anderson, PT, DPT, [facility] [address] [place], [place]  [address] Phone:  [phone] Fax:  [phone]

## 2020-02-15 NOTE — Therapy (Signed)
[facility] [address] [place], [place], [address] Phone: [phone]   Fax:  [phone]  Physical Therapy Treatment  Patient Details  Name: Matthew Black MRN: [id number] Date of Birth: 1976-03-30 Referring Provider (PT): Dr. Naaman Plummer but f/u with Dr. Ranell Patrick   Encounter Date: 02/15/2020  PT End of Session - 02/15/20 1235    Visit Number  8    Number of Visits  16    Date for PT Re-Evaluation  03/19/20    Authorization Type  Medicaid 3 visits approved 12/30/19-01/19/20; Medicaid extension approved for 12 PT visits: 02/02/20-03/28/20    Authorization - Visit Number  4   pt's initial MCD auth for 3 visits met on 01/19/20, new auth for 12 visits (02/02/20-03/28/20   Authorization - Number of Visits  12    PT Start Time  1233    PT Stop Time  1315    PT Time Calculation (min)  42 min    Equipment Utilized During Treatment  Gait belt    Activity Tolerance  Patient limited by pain    Behavior During Therapy  Flat affect       Past Medical History:  Diagnosis Date  . 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 Depression   . Hemorrhagic stroke ([place])   . Kidney stones     Past Surgical History:  Procedure Laterality Date  . CRANIOTOMY Right 07/19/2019   Procedure: RIGHT HEMI-CRANIECTOMY With implantation of skull flap to abdominal wall;  Surgeon: Consuella Lose, MD;  Location: [facility];  Service: Neurosurgery;  Laterality: Right;  . CYST EXCISION N/A 10/08/2016   Procedure: EXCISION OF POSTERIOR NECK CYST;  Surgeon: Clovis Riley, MD;  Location: [facility];  Service: General;  Laterality: N/A;  . INCISION AND DRAINAGE ABSCESS N/A 09/22/2014   Procedure: INCISION AND DRAINAGE ABSCESS POSTERIOR NECK;  Surgeon: Pedro Earls, MD;  Location: [facility];  Service: General;  Laterality: N/A;  . INCISION AND DRAINAGE ABSCESS N/A 12/20/2015   Procedure: INCISION AND DRAINAGE POSTERIOR NECK MASS;  Surgeon: Armandina Gemma, MD;  Location[facility];   Service: General;  Laterality: N/A;  . INCISION AND DRAINAGE ABSCESS Left 07/10/2004   middle finger  . IR IVC FILTER PLMT / S&I /IMG GUID/MOD SED  07/21/2019  . IR RADIOLOGIST EVAL & MGMT  12/14/2019  . IR VENOGRAM RENAL UNI RIGHT  07/21/2019  . MASS EXCISION N/A 07/21/2017   Procedure: EXCISION OF BENIGN NECK LESION WITH LAYERED CLOSURE;  Surgeon: Irene Limbo, MD;  Location: [facility];  Service: Plastics;  Laterality: N/A;  . MASS EXCISION N/A 11/10/2017   Procedure: EXCISION BENIGN LESION OF THE NECK WITH LAYERED CLOSURE;  Surgeon: Irene Limbo, MD;  Location: [facility];  Service: Plastics;  Laterality: N/A;    There were no vitals filed for this visit.  Subjective Assessment - 02/15/20 2028    Subjective  Pt's wife had to go to appointment so not present at session. Pt denies any issues. Wearing shoes today that will not work with AFO.    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  No/denies    Pain Onset  More than a month ago                       [facility] Adult PT Treatment/Exercise - 02/15/20 1239      Transfers   Transfers  Sit to Stand;Stand to Sit    Sit to Stand  4: Min guard;4: Min assist    Sit to Stand Details  Verbal cues for precautions/safety;Verbal cues for  technique    Stand to Sit  4: Min assist    Stand to Sit Details (indicate cue type and reason)  Verbal cues for technique    Squat Pivot Transfers  4: Min assist    Squat Pivot Transfer Details (indicate cue type and reason)  w/c to mat      Ambulation/Gait   Ambulation/Gait  Yes    Ambulation/Gait Assistance  3: Mod assist    Ambulation/Gait Assistance Details  Pt was given verbal cues to try to incresae right step length and tighten gluts/quads with left stance. PT close to left knee for safety but no buckling that required physical assist to stop. Right knee did flex more as went on and fatigued. BP=123/82 after gait. Second therapist standing by for safety and w/c follow.    Ambulation Distance (Feet)  35 Feet   40' x1   Assistive device  Large base quad cane    Gait Pattern  Step-to pattern;Step-through pattern;Decreased hip/knee flexion - left;Decreased weight shift to left;Decreased stance time - left    Ambulation Surface  Level;Indoor    Pre-Gait Activities  Weight shifting activities in // bars: weight shifting side to side with mirror for visual cues, verbal cues to tighten gluts and quad and tactile cues at left knee and pelvis to help with shift over left leg. Performed x 10. Stepping to dot with right leg to facilitate left weight shift x 10 with PT blocking at left knee for safety.,    Gait Comments  Pt ambulated in // bars 6' x 4 min assist with PT blocking left knee at first but patient able to demonstrate ability to control buckling.             PT Education - 02/15/20 2036    Education Details  Pt to continue with current HEP    Person(s) Educated  Patient    Methods  Explanation    Comprehension  Verbalized understanding       PT Short Term Goals - 01/19/20 2059      PT SHORT TERM GOAL #1   Title  Pt will be independent with HEP for strengthening, balance and flexibility with assist of wife to continue gains on own.    Baseline  Pt has started initial  strengthening HEP with assist of wife.    Time  4    Period  Weeks    Status  New    Target Date  02/18/20      PT SHORT TERM GOAL #2   Title  Pt will be min assist for bed mobility for improved mobility.    Baseline  mod/max assist with bed mobility    Time  4  Period  Weeks    Status  New    Target Date  02/18/20      PT SHORT TERM GOAL #3   Title  Pt will perform transfers CGA for improved mobility.    Baseline  min assist with stand/pivot and sit to stand transfers.    Time  4    Period  Weeks    Status  New    Target Date  02/18/20      PT SHORT TERM GOAL #4   Title  Pt will ambulate 20' with hemiwalker versus quad cane mod assist for improved mobility.    Baseline  10' mod asssit in // bars.    Time  4    Period  Weeks    Status  New    Target Date  02/18/20      PT SHORT TERM GOAL #5   Title  Pt will have orthotic fitting for left AFO after trialed in clinic    Baseline  no AFO    Time  4    Period  Weeks    Status  New    Target Date  02/18/20        PT Long Term Goals - 01/19/20 2105      PT LONG TERM GOAL #1   Title  Pt will be supervision with bed mobility for improved function in home.    Baseline  mod/max assist.    Time  8    Period  Weeks    Status  New    Target Date  03/19/20      PT LONG TERM GOAL #2   Title  Pt will be supervision with transfers for improved mobility.    Baseline  min assist    Time  8    Period  Weeks    Status  New    Target Date  03/19/20      PT LONG TERM GOAL #3   Title  Pt will ambulate >100' with LRAD min assist for improved mobility.    Baseline  10' mod assist in // bars    Time  8    Period  Weeks    Status  New    Target Date  03/19/20      PT LONG TERM GOAL #4   Title  Pt will maintain standing x 5 min with minimal UE support supervision for improved balance and ADLs.    Baseline  min assist for 1-2 min in // bars.    Time  8    Period  Weeks    Status  New    Target Date  03/19/20             Plan - 02/15/20 2036    Clinical Impression Statement  Pt did not have shoes that AFO would work with today. Was able to trial gait without AFO and although patient had less stability at left knee he did not buckle requiring physical assist to stop. Did have more difficulty weight shifting more over left leg with decreased stance time.    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 08/20/19 w aspiration 08/21/19, (tx empircally w vanco/ cefepime), LLE DVT 07/21/19 and small left lower lung PE 08/01/19 s/p  IVC filter 07/21/19, iv heparin discontinued due to worsening ICH 08/14/19, and abdominal wall hematoma, seizure, pseudomonas uti,    Examination-Activity Limitations  Bathing;Sit;Bed Mobility;Bend;Squat;Caring for Others;Stairs;Stand;Carry;Toileting;Dressing;Transfers;Hygiene/Grooming;Locomotion Level    Examination-Participation Restrictions  Driving;Other;Meal Prep   running business   Stability/Clinical Decision Making  Evolving/Moderate complexity    Rehab Potential  Good    PT Frequency  2x / week   followed by 1x/week for 4 weeks   PT Duration  4 weeks    PT Treatment/Interventions  ADLs/Self Care Home Management;Aquatic Therapy;Biofeedback;Balance training;Therapeutic exercise;Therapeutic activities;Functional mobility training;Wheelchair mobility training;Orthotic Fit/Training;Stair training;Gait training;DME Instruction;Patient/family education;Cognitive remediation;Neuromuscular re-education;Vestibular;Manual techniques;Passive range of motion;Cryotherapy;Moist Heat    PT Next Visit Plan  Check STGs. Continue gait with LBQC and left AFO (anterior thuasne) or walker with left hand grip? to see  if will help with weight shift.  Needs AFO. Has one been ordered? Continue to work on weight shifting, left LE strengthening, add flexibility and balance HEP (with UE support at counter)    Recommended Other Services  PT requesting AFO order from Dr. Ranell Patrick.    Consulted and Agree with Plan of Care  Patient;Family member/caregiver    Family Member Consulted  --       Patient will benefit from skilled therapeutic intervention in order to improve the following deficits and impairments:  Abnormal gait, Difficulty walking, Decreased mobility, Decreased balance, Decreased cognition, Decreased range of motion, Decreased coordination, Decreased safety awareness, Impaired flexibility, Postural dysfunction, Decreased knowledge of use of DME, Decreased strength, Impaired UE functional use, Pain, Impaired tone, Impaired sensation, Decreased endurance(PT will monitor pain closely but not directly treat)  Visit Diagnosis: Muscle weakness (generalized)  Other abnormalities of gait and mobility     Problem List Patient Active Problem List   Diagnosis Date Noted  . Peri-rectal abscess 02/14/2020  . Abnormal CT scan, pelvis 02/14/2020  . Pancytopenia ([facility]) 12/02/2019  . Nephrolithiasis 12/02/2019  . Hydronephrosis with renal and ureteral calculus obstruction 12/02/2019  . Rectal pain 12/02/2019  . Hyperkalemia 12/02/2019  . Reactive depression   . Wound infection after surgery   . Sleep disturbance   . Dysphagia, post-stroke   . Transaminitis   . 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 Cerebral abscess   . Urinary tract infection without hematuria   . Altered mental status   . Primary hypercoagulable state ([place])   . Acute pulmonary embolism without acute cor pulmonale (HCC)   . Deep vein thrombosis (DVT) of non-extremity vein   . Hypokalemia   . Acute blood loss anemia   . Leukocytosis   . Endotracheal tube present   . Acute respiratory failure with hypoxemia ([place])   .  Stroke (cerebrum) ([facility]) 07/19/2019  . Pressure injury of skin 07/19/2019  . Acute CVA (cerebrovascular accident) ([place])   . Encephalopathy   . Dysphagia   . Acute encephalopathy   . Essential hypertension   . Obesity 03/12/2016  . Scalp abscess 12/20/2015  . Neck abscess 12/20/2015  . Pilonidal cyst 02/08/2013    Electa Sniff, PT, DPT, NCS 02/15/2020, 8:39 PM  [facility] [address] [place], [place], [address] Phone: [phone]   Fax:  [phone]  Name: Matthew Black MRN: [id number] Date of Birth: [birth year]

## 2020-02-16 ENCOUNTER — Ambulatory Visit: Payer: Medicaid Other | Admitting: Occupational Therapy

## 2020-02-16 ENCOUNTER — Encounter: Payer: Self-pay | Admitting: Occupational Therapy

## 2020-02-16 ENCOUNTER — Other Ambulatory Visit: Payer: Self-pay | Admitting: Physical Medicine and Rehabilitation

## 2020-02-16 ENCOUNTER — Ambulatory Visit: Payer: Medicaid Other

## 2020-02-16 DIAGNOSIS — R208 Other disturbances of skin sensation: Secondary | ICD-10-CM

## 2020-02-16 DIAGNOSIS — M6281 Muscle weakness (generalized): Secondary | ICD-10-CM

## 2020-02-16 DIAGNOSIS — R29898 Other symptoms and signs involving the musculoskeletal system: Secondary | ICD-10-CM | POA: Diagnosis not present

## 2020-02-16 DIAGNOSIS — M25612 Stiffness of left shoulder, not elsewhere classified: Secondary | ICD-10-CM

## 2020-02-16 DIAGNOSIS — M25512 Pain in left shoulder: Secondary | ICD-10-CM | POA: Diagnosis not present

## 2020-02-16 DIAGNOSIS — G8929 Other chronic pain: Secondary | ICD-10-CM | POA: Diagnosis not present

## 2020-02-16 DIAGNOSIS — Z7689 Persons encountering health services in other specified circumstances: Secondary | ICD-10-CM | POA: Diagnosis not present

## 2020-02-16 DIAGNOSIS — R2689 Other abnormalities of gait and mobility: Secondary | ICD-10-CM

## 2020-02-16 DIAGNOSIS — R2681 Unsteadiness on feet: Secondary | ICD-10-CM | POA: Diagnosis not present

## 2020-02-16 DIAGNOSIS — I69354 Hemiplegia and hemiparesis following cerebral infarction affecting left non-dominant side: Secondary | ICD-10-CM | POA: Diagnosis not present

## 2020-02-16 DIAGNOSIS — R209 Unspecified disturbances of skin sensation: Secondary | ICD-10-CM | POA: Diagnosis not present

## 2020-02-16 DIAGNOSIS — I639 Cerebral infarction, unspecified: Secondary | ICD-10-CM

## 2020-02-16 DIAGNOSIS — I69319 Unspecified symptoms and signs involving cognitive functions following cerebral infarction: Secondary | ICD-10-CM | POA: Diagnosis not present

## 2020-02-16 NOTE — Telephone Encounter (Signed)
I agree, and have placed the order, thank you!

## 2020-02-16 NOTE — Therapy (Signed)
[facility] [address] [place], [place], [address] Phone: [phone]   Fax:  [phone]  Occupational Therapy Treatment  Patient Details  Name: Matthew Black MRN: [id number] Date of Birth: 12-02-1975 Referring Provider (OT): Dr. Leeroy Cha   Encounter Date: 02/16/2020  OT End of Session - 02/16/20 1545    Visit Number  6    Number of Visits  10    Date for OT Re-Evaluation  03/14/20    Authorization Type  CCME Approved 9 visits 3/11-4/21/21    Authorization - Visit Number  6    Authorization - Number of Visits  9    OT Start Time  [id number]    OT Stop Time  1527    OT Time Calculation (min)  42 min    Activity Tolerance  Patient limited by pain    Behavior During Therapy  Flat affect       Past Medical History:  Diagnosis Date  . 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 Depression   . Hemorrhagic stroke ([place])   . Kidney stones     Past Surgical History:  Procedure Laterality Date  . CRANIOTOMY Right 07/19/2019   Procedure: RIGHT HEMI-CRANIECTOMY With implantation of skull flap to abdominal wall;  Surgeon: Consuella Lose, MD;  Location: [facility];  Service: Neurosurgery;  Laterality: Right;  . CYST EXCISION N/A 10/08/2016   Procedure: EXCISION OF POSTERIOR NECK CYST;  Surgeon: Clovis Riley, MD;  [facility];  Service: General;  Laterality: N/A;  . INCISION AND DRAINAGE ABSCESS N/A 09/22/2014   Procedure: INCISION AND DRAINAGE ABSCESS POSTERIOR NECK;  Surgeon: Pedro Earls, MD;  [facility];  Service: General;  Laterality: N/A;  . INCISION AND DRAINAGE ABSCESS N/A 12/20/2015   Procedure: INCISION AND DRAINAGE POSTERIOR NECK MASS;  Surgeon: Armandina Gemma, MD;  [facility];  Service: General;  Laterality: N/A;  . INCISION AND DRAINAGE ABSCESS Left 07/10/2004   middle finger  . IR IVC FILTER PLMT / S&I /IMG GUID/MOD SED  07/21/2019  . IR RADIOLOGIST EVAL & MGMT  12/14/2019  . IR VENOGRAM RENAL UNI  RIGHT  07/21/2019  . MASS EXCISION N/A 07/21/2017   Procedure: EXCISION OF BENIGN NECK LESION WITH LAYERED CLOSURE;  Surgeon: Irene Limbo, MD;  Location: [facility];  Service: Plastics;  Laterality: N/A;  . MASS EXCISION N/A 11/10/2017   Procedure: EXCISION BENIGN LESION OF THE NECK WITH LAYERED CLOSURE;  Surgeon: Irene Limbo, MD;  Location: [facility];  Service: Plastics;  Laterality: N/A;    There were no vitals filed for this visit.  Subjective Assessment - 02/16/20 1536    Subjective   I feel like I have been hit by a train.    Patient is accompanied by:  Family member    Currently in Pain?  Yes    Pain Score  8     Pain Location  Head    Pain Orientation  Right    Pain Descriptors / Indicators  Aching    Pain Type  Chronic pain    Pain Onset  More than a month ago    Pain Frequency  Intermittent    Aggravating Factors   position change    Pain Relieving Factors  Lying down and medication                   OT Treatments/Exercises (OP) - 02/16/20 1539      ADLs  LB Dressing  Spoke with wife about number of visits remaining and that it was important to make good use of each visit tohelp manage patient at home and reduce shoulder pain.  Wife in agreement.  Practiced lower body dressing.  Patient needed frequent cues and increased time to complete dressing.  Patient with severe hemiplegia, apraxi, and decreased sensation, so practice is critical to his success.  Discussed allowing 15-20 minutes for LBV dressing on days when they are not rushed.  Reviewed with patrient and wife the benefit of allowing practice with self care tasks as this will build strength, improve postural control/balance and conditioning.                OT Education - 02/16/20 1545    Education Details  Self dressing techniques    Person(s) Educated  Patient;Spouse    Methods  Explanation;Demonstration;Tactile cues;Verbal cues    Comprehension   Verbalized understanding;Returned demonstration;Need further instruction       OT Short Term Goals - 02/16/20 1548      OT SHORT TERM GOAL #1   Title  Pt will perform HEP for ROM with wife assistance prn--check STGs 02/27/20    Status  On-going      OT SHORT TERM GOAL #2   Title  Pt will preform UB dressing with min A.    Status  Achieved      OT SHORT TERM GOAL #3   Title  Pt/wife will verbalize understanding of proper positioning of LUE to decr risk of pain and manage spasticity (including updates to splint prn).    Status  Achieved      OT SHORT TERM GOAL #4   Title  Pt will demo at least 70* L shoulder flex PROM without pain to assist with ADLs.    Status  Achieved        OT Long Term Goals - 02/02/20 1450      OT LONG TERM GOAL #1   Title  Pt/wife will be independent with updated HEP.--check LTGs 03/19/20    Status  On-going      OT LONG TERM GOAL #2   Title  Pt will perform bathing with min A.    Status  On-going      OT LONG TERM GOAL #3   Title  Pt will demo at least 90* L shoulder flex PROM without pain for incr ease with ADLs    Status  On-going      OT LONG TERM GOAL #4   Title  Pt will perform LB dressing with min A.    Status  On-going      OT LONG TERM GOAL #5   Title  Pt will be able to use LUE as stabilizer for selected tasks with min A.    Status  On-going            Plan - 02/16/20 1546    Clinical Impression Statement  Patient with improved static stand balance, although very strong right bias.  Patient with limited visits- emphasis on family education and reduction of shoulder pain.    OT Frequency  2x / week    OT Treatment/Interventions  Self-care/ADL training;DME and/or AE instruction;Fluidtherapy;Moist Heat;Splinting;Balance training;Therapeutic activities;Aquatic Therapy;Ultrasound;Therapeutic exercise;Cognitive remediation/compensation;Passive range of motion;Functional Mobility Training;Neuromuscular education;Cryotherapy;Manual  Therapy;Patient/family education;Visual/perceptual remediation/compensation;Energy conservation;Electrical Stimulation    Plan  Continue with HEP as wife available, ADL, bed positioning- shoulder pain options for support    OT Home Exercise Plan  Assisted movement on cane  RUE    Consulted and Agree with Plan of Care  Patient;Family member/caregiver    Family Member Consulted  wife       Patient will benefit from skilled therapeutic intervention in order to improve the following deficits and impairments:           Visit Diagnosis: Hemiplegia and hemiparesis following cerebral infarction affecting left non-dominant side (HCC)  Chronic left shoulder pain  Other disturbances of skin sensation  Unsteadiness on feet  Muscle weakness (generalized)  Stiffness of left shoulder, not elsewhere classified    Problem List Patient Active Problem List   Diagnosis Date Noted  . Peri-rectal abscess 02/14/2020  . Abnormal CT scan, pelvis 02/14/2020  . Pancytopenia ([place]) 12/02/2019  . Nephrolithiasis 12/02/2019  . Hydronephrosis with renal and ureteral calculus obstruction 12/02/2019  . Rectal pain 12/02/2019  . Hyperkalemia 12/02/2019  . Reactive depression   . Wound infection after surgery   . Sleep disturbance   . Dysphagia, post-stroke   . Transaminitis   . 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 Cerebral abscess   . Urinary tract infection without hematuria   . Altered mental status   . Primary hypercoagulable state ([place])   . Acute pulmonary embolism without acute cor pulmonale (HCC)   . Deep vein thrombosis (DVT) of non-extremity vein   . Hypokalemia   . Acute blood loss anemia   . Leukocytosis   . Endotracheal tube present   . Acute respiratory failure with hypoxemia ([facility])   . Stroke (cerebrum) ([facility]) 07/19/2019  . Pressure injury of skin 07/19/2019  . Acute CVA (cerebrovascular accident) ([facility])   . Encephalopathy   . Dysphagia   . Acute encephalopathy    . Essential hypertension   . Obesity 03/12/2016  . Scalp abscess 12/20/2015  . Neck abscess 12/20/2015  . Pilonidal cyst 02/08/2013    Mariah Milling, OTR/L 02/16/2020, 3:49 PM  [facility] [address] [place], [place], [address] Phone: [phone]   Fax:  [phone]  Name: Matthew Black MRN: [id number] Date of Birth: 1976/05/08

## 2020-02-16 NOTE — Telephone Encounter (Signed)
Thanks so much. 

## 2020-02-16 NOTE — Therapy (Signed)
[facility] [address] [place], [place], [address] Phone: [phone]   Fax:  [phone]  Physical Therapy Treatment  Patient Details  Name: Matthew Black MRN: [id number] Date of Birth: 01-13-1976 Referring Provider (PT): Dr. Naaman Plummer but f/u with Dr. Ranell Patrick   Encounter Date: 02/16/2020  PT End of Session - 02/16/20 1405    Visit Number  9    Number of Visits  16    Date for PT Re-Evaluation  03/19/20    Authorization Type  Medicaid 3 visits approved 12/30/19-01/19/20; Medicaid extension approved for 12 PT visits: 02/02/20-03/28/20    Authorization - Visit Number  4   pt's initial MCD auth for 3 visits met on 01/19/20, new auth for 12 visits (02/02/20-03/28/20   Authorization - Number of Visits  12    PT Start Time  1405    PT Stop Time  1445    PT Time Calculation (min)  40 min    Equipment Utilized During Treatment  Gait belt    Activity Tolerance  Patient limited by pain    Behavior During Therapy  Flat affect       Past Medical History:  Diagnosis Date  . 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 Depression   . Hemorrhagic stroke ([place])   . Kidney stones     Past Surgical History:  Procedure Laterality Date  . CRANIOTOMY Right 07/19/2019   Procedure: RIGHT HEMI-CRANIECTOMY With implantation of skull flap to abdominal wall;  Surgeon: Consuella Lose, MD;  Location: [facility];  Service: Neurosurgery;  Laterality: Right;  . CYST EXCISION N/A 10/08/2016   Procedure: EXCISION OF POSTERIOR NECK CYST;  Surgeon: Clovis Riley, MD;  Location: [facility];  Service: General;  Laterality: N/A;  . INCISION AND DRAINAGE ABSCESS N/A 09/22/2014   Procedure: INCISION AND DRAINAGE ABSCESS POSTERIOR NECK;  Surgeon: Pedro Earls, MD;  [facility];  Service: General;  Laterality: N/A;  . INCISION AND DRAINAGE ABSCESS N/A 12/20/2015   Procedure: INCISION AND DRAINAGE POSTERIOR NECK MASS;  Surgeon: Armandina Gemma, MD;  [facility];   Service: General;  Laterality: N/A;  . INCISION AND DRAINAGE ABSCESS Left 07/10/2004   middle finger  . IR IVC FILTER PLMT / S&I /IMG GUID/MOD SED  07/21/2019  . IR RADIOLOGIST EVAL & MGMT  12/14/2019  . IR VENOGRAM RENAL UNI RIGHT  07/21/2019  . MASS EXCISION N/A 07/21/2017   Procedure: EXCISION OF BENIGN NECK LESION WITH LAYERED CLOSURE;  Surgeon: Irene Limbo, MD;  Location: [facility];  Service: Plastics;  Laterality: N/A;  . MASS EXCISION N/A 11/10/2017   Procedure: EXCISION BENIGN LESION OF THE NECK WITH LAYERED CLOSURE;  Surgeon: Irene Limbo, MD;  Location: [facility];  Service: Plastics;  Laterality: N/A;    There were no vitals filed for this visit.  Subjective Assessment - 02/16/20 1407    Subjective  Pt. reporting that is his feeling fine. No pain today.    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 08/20/19 w aspiration 08/21/19, (tx empircally w vanco/ cefepime), LLE DVT 07/21/19 and small left lower lung PE 08/01/19 s/p IVC filter 07/21/19, iv heparin discontinued due to worsening ICH 08/14/19, and abdominal  wall hematoma, seizure, pseudomonas uti,    Patient Stated Goals  to be able to walk, use my L hand, improve balance, walk up the stairs to the bedroom    Pain Onset  More than a month ago                       [facility] Adult PT Treatment/Exercise - 02/16/20 1416      Bed Mobility   Bed Mobility  Rolling Right;Rolling Left;Supine to Sit;Sit to Supine    Rolling Right  Minimal Assistance - Patient > 75%   verbal cues to bring left arm across body   Rolling Left  Minimal Assistance - Patient > 75%    Supine to Sit  Minimal Assistance - Patient > 75%    at left leg   Sit to Supine  Minimal Assistance - Patient > 75%   at left leg     Transfers   Transfers  Stand Pivot Transfers;Sit to Stand;Stand to Sit    Sit to Stand  4: Min guard;4: Min assist    Sit to Stand Details  Tactile cues for weight shifting    Sit to Stand Details (indicate cue type and reason)  verbal cues for STS from mat for improved weight shift and upright posture    Stand to Sit  4: Min assist    Stand to Sit Details (indicate cue type and reason)  Verbal cues for technique    Stand Pivot Transfers  4: Min guard;4: Min assist    Stand Pivot Transfer Details (indicate cue type and reason)  Stand pivot transfer from wc <> mat. First transfer w/c to mat min assist as patient was not initiating turn. After that CGA.    Comments  Pt. requiring CGA assist for stand pivot transfer wc <> mat. verbal cues needed for improved technique.      Ambulation/Gait   Ambulation/Gait  Yes    Ambulation/Gait Assistance  3: Mod assist    Ambulation/Gait Assistance Details  Pt was given verbal cues to increase right step length. Left knee had increased flexion in stance as went on. Using trunk to help with left leg advancement.    Ambulation Distance (Feet)  43 Feet    Assistive device  Large base quad cane    Gait Pattern  Step-to pattern;Decreased step length - right;Decreased step length - left;Decreased hip/knee flexion - left;Left flexed knee in stance;Narrow base of support    Ambulation Surface  Level;Indoor    Pre-Gait Activities  Lateral weight shifting x 10 for improved carryover into gait with PT blocking at left knee and pt given verbal cues to try to tighten quad and gluts with left weight shift. First performed with [facility] supppor then repeated in // bars with pt getting more weight shift. Attempted to try stepping forward and back at mat with right foot with PT stabilizing LLE at knee but patient unsteady. Switched to in // bars. Fwd/backward steps in // bars 1 x 10 with PT  blocking at left knee with better weight shift.    Gait Comments  Gait in // bars x 3, focus on weight shift on LLE, upright posture, and improved step length. Progressed to ambulation with [facility],              PT Education - 02/16/20 2125    Education Details  Let patient and wife know that AFO order was received and would request orthotist to come for a  visit. Need to bring sneakers that lace up and can open to allow AFO as slide on sneakers will not work. Discussed continueing current HEP including standing at counter with weight shifting with wife every couple hours.    Person(s) Educated  Patient;Spouse    Methods  Explanation    Comprehension  Verbalized understanding       PT Short Term Goals - 02/16/20 1409      PT SHORT TERM GOAL #1   Title  Pt will be independent with HEP for strengthening, balance and flexibility with assist of wife to continue gains on own.    Baseline  Pt has started initial strengthening HEP with assist of wife for strengthening, weight shifting and flexibility. Continue to progress.    Time  4    Period  Weeks    Status  On-going    Target Date  02/18/20      PT SHORT TERM GOAL #2   Title  Pt will be min assist for bed mobility for improved mobility.    Baseline  min assist with bed mobility, still require some assistance with LLE.    Time  4    Period  Weeks    Status  Achieved    Target Date  02/18/20      PT SHORT TERM GOAL #3   Title  Pt will perform transfers CGA for improved mobility.    Baseline  CGA/ min assist with stand/pivot and sit to stand transfers.    Time  4    Period  Weeks    Status  On-going    Target Date  02/18/20      PT SHORT TERM GOAL #4   Title  Pt will ambulate 20' with hemiwalker versus quad cane mod assist for improved mobility.    Baseline  48' with large based quad cane mod assist    Time  4    Period  Weeks    Status  Achieved    Target Date  02/18/20      PT SHORT TERM GOAL #5   Title  Pt will have  orthotic fitting for left AFO after trialed in clinic    Baseline  AFO order just received. Will schedule visit with orthotist to come in.    Time  4    Period  Weeks    Status  On-going    Target Date  02/18/20        PT Long Term Goals - 01/19/20 2105      PT LONG TERM GOAL #1   Title  Pt will be supervision with bed mobility for improved function in home.    Baseline  mod/max assist.    Time  8    Period  Weeks    Status  New    Target Date  03/19/20      PT LONG TERM GOAL #2   Title  Pt will be supervision with transfers for improved mobility.    Baseline  min assist    Time  8    Period  Weeks    Status  New    Target Date  03/19/20      PT LONG TERM GOAL #3   Title  Pt will ambulate >100' with LRAD min assist for improved mobility.    Baseline  10' mod assist in // bars    Time  8    Period  Weeks    Status  New  Target Date  03/19/20      PT LONG TERM GOAL #4   Title  Pt will maintain standing x 5 min with minimal UE support supervision for improved balance and ADLs.    Baseline  min assist for 1-2 min in // bars.    Time  8    Period  Weeks    Status  New    Target Date  03/19/20            Plan - 02/16/20 2129    Clinical Impression Statement  STGs were reassessed today. Pt met bed mobility goal at min assist level. Pt still min/CGA with transfers for safety. Pt was able to further progress gait distance today mod assist with [facility] but having trouble increasing weight shift over left leg due to decreased stability. Unable to utilize AFO again as did not have proper shoes. Pt continues to benefit from skilled PT to continue to progress gait, strength and balance.    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 08/20/19 w aspiration 08/21/19, (tx empircally w vanco/ cefepime), LLE DVT 07/21/19 and small left lower lung PE 08/01/19 s/p IVC filter 07/21/19, iv heparin discontinued due to worsening ICH 08/14/19, and abdominal wall hematoma, seizure, pseudomonas uti,    Examination-Activity Limitations  Bathing;Sit;Bed Mobility;Bend;Squat;Caring for Others;Stairs;Stand;Carry;Toileting;Dressing;Transfers;Hygiene/Grooming;Locomotion Level    Examination-Participation Restrictions  Driving;Other;Meal Prep   running business   Stability/Clinical Decision Making  Evolving/Moderate complexity    Rehab Potential  Good    PT Frequency  2x / week   followed by 1x/week for 4 weeks   PT Duration  4 weeks    PT Treatment/Interventions  ADLs/Self Care Home Management;Aquatic Therapy;Biofeedback;Balance training;Therapeutic exercise;Therapeutic activities;Functional mobility training;Wheelchair mobility training;Orthotic Fit/Training;Stair training;Gait training;DME Instruction;Patient/family education;Cognitive remediation;Neuromuscular re-education;Vestibular;Manual techniques;Passive range of motion;Cryotherapy;Moist Heat    PT Next Visit Plan  Continue gait with [facility] and left AFO (anterior thuasne) or walker with left hand grip? to see if will help with weight shift. Wife asking about steps? Is he able to attempt step up with AFO?  Continue to work on weight shifting, left LE strengthening, add flexibility and balance HEP (with UE support at counter)    Consulted and Agree with Plan of Care  Patient;Family member/caregiver       Patient will benefit from skilled therapeutic intervention in order to improve the following deficits and impairments:  Abnormal gait, Difficulty walking, Decreased mobility, Decreased balance, Decreased cognition, Decreased range of motion, Decreased coordination, Decreased safety awareness, Impaired flexibility, Postural dysfunction, Decreased knowledge  of use of DME, Decreased strength, Impaired UE functional use, Pain, Impaired tone, Impaired sensation, Decreased endurance(PT will monitor pain closely but not directly treat)  Visit Diagnosis: Muscle weakness (generalized)  Other abnormalities of gait and mobility     Problem List Patient Active Problem List   Diagnosis Date Noted  . Peri-rectal abscess 02/14/2020  . Abnormal CT scan, pelvis 02/14/2020  . Pancytopenia ([place]) 12/02/2019  . Nephrolithiasis 12/02/2019  . Hydronephrosis with renal and ureteral calculus obstruction 12/02/2019  . Rectal pain 12/02/2019  . Hyperkalemia 12/02/2019  . Reactive depression   . Wound infection after surgery   . Sleep disturbance   . Dysphagia, post-stroke   . Transaminitis   . 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 Cerebral abscess   . Urinary tract infection without hematuria   . Altered mental status   . Primary hypercoagulable state ([place])   . Acute pulmonary embolism without acute cor pulmonale (HCC)   . Deep vein thrombosis (DVT) of non-extremity vein   . Hypokalemia   . Acute blood loss anemia   . Leukocytosis   . Endotracheal tube present   . Acute respiratory failure with hypoxemia ([place])   . Stroke (cerebrum) ([place]) 07/19/2019  . Pressure injury of skin 07/19/2019  . Acute CVA (cerebrovascular accident) ([place])   . Encephalopathy   . Dysphagia   . Acute encephalopathy   . Essential hypertension   . Obesity 03/12/2016  . Scalp abscess 12/20/2015  . Neck abscess 12/20/2015  . Pilonidal cyst 02/08/2013    Electa Sniff, PT, DPT, NCS 02/16/2020, 9:34 PM  [facility] [address] [place], [place], [address] Phone: [phone]   Fax:  [phone]  Name: FILIP LUTEN MRN: [id number] Date of Birth: 05-Nov-1976

## 2020-02-17 ENCOUNTER — Telehealth: Payer: Self-pay

## 2020-02-17 NOTE — Telephone Encounter (Signed)
PT called [facility] to schedule time for orthotist to come in for AFO consult. Jinny Blossom will be coming to visit 3/31 at 2:45. Cherly Anderson, PT, DPT, NCS

## 2020-02-17 NOTE — Progress Notes (Signed)
Reviewed and agree with documentation and assessment and plan. K. Veena Add Dinapoli , MD   

## 2020-02-20 ENCOUNTER — Telehealth: Payer: Self-pay

## 2020-02-20 NOTE — Telephone Encounter (Signed)
Pt's wife called requesting a refill for  tramadol for pain , per pt's  last office note pt was no longer taking tramadol. She also stated that he  still having headaches, pt use Topamax in the past and stopped taking because was not help.

## 2020-02-21 DIAGNOSIS — N201 Calculus of ureter: Secondary | ICD-10-CM | POA: Diagnosis not present

## 2020-02-21 MED ORDER — TRAMADOL HCL 50 MG PO TABS: 50.0000 mg | ORAL_TABLET | Freq: Two times a day (BID) | ORAL | 0 refills | Status: DC | PRN

## 2020-02-21 NOTE — Telephone Encounter (Signed)
Return Matthew Black call, she reports Matthew Black complaining of headache and left arm pain. Dr. Ranell Patrick note was reviewed. PMP was Reviewed, last Tramadol was prescribed on 10/06/2019. Discharge summary was reviewed.  Matthew Black and Matthew Black denies and SOB or chest pain. Will prescribe Tramadol today, he has an appointment with Dr Ranell Patrick on 03/13/2020. OT Note was reviewed.

## 2020-02-21 NOTE — Addendum Note (Signed)
Addended by: Bayard Hugger on: 02/21/2020 04:50 PM   Modules accepted: Orders

## 2020-02-22 ENCOUNTER — Ambulatory Visit: Payer: Medicaid Other

## 2020-02-22 ENCOUNTER — Other Ambulatory Visit: Payer: Self-pay

## 2020-02-22 ENCOUNTER — Telehealth: Payer: Self-pay

## 2020-02-22 ENCOUNTER — Encounter: Payer: Self-pay | Admitting: Occupational Therapy

## 2020-02-22 ENCOUNTER — Ambulatory Visit: Payer: Medicaid Other | Admitting: Occupational Therapy

## 2020-02-22 DIAGNOSIS — I69319 Unspecified symptoms and signs involving cognitive functions following cerebral infarction: Secondary | ICD-10-CM

## 2020-02-22 DIAGNOSIS — I69354 Hemiplegia and hemiparesis following cerebral infarction affecting left non-dominant side: Secondary | ICD-10-CM

## 2020-02-22 DIAGNOSIS — R29898 Other symptoms and signs involving the musculoskeletal system: Secondary | ICD-10-CM | POA: Diagnosis not present

## 2020-02-22 DIAGNOSIS — Z7689 Persons encountering health services in other specified circumstances: Secondary | ICD-10-CM | POA: Diagnosis not present

## 2020-02-22 DIAGNOSIS — R2689 Other abnormalities of gait and mobility: Secondary | ICD-10-CM

## 2020-02-22 DIAGNOSIS — G8929 Other chronic pain: Secondary | ICD-10-CM

## 2020-02-22 DIAGNOSIS — M25612 Stiffness of left shoulder, not elsewhere classified: Secondary | ICD-10-CM | POA: Diagnosis not present

## 2020-02-22 DIAGNOSIS — M25512 Pain in left shoulder: Secondary | ICD-10-CM

## 2020-02-22 DIAGNOSIS — R208 Other disturbances of skin sensation: Secondary | ICD-10-CM

## 2020-02-22 DIAGNOSIS — M6281 Muscle weakness (generalized): Secondary | ICD-10-CM | POA: Diagnosis not present

## 2020-02-22 DIAGNOSIS — R2681 Unsteadiness on feet: Secondary | ICD-10-CM

## 2020-02-22 DIAGNOSIS — R209 Unspecified disturbances of skin sensation: Secondary | ICD-10-CM | POA: Diagnosis not present

## 2020-02-22 NOTE — Therapy (Signed)
[facility] [address] [place], [place], [address] Phone: [phone]   Fax:  [phone]  Occupational Therapy Treatment  Patient Details  Name: Matthew Black MRN: [id number] Date of Birth: 12/23/1975 Referring Provider (OT): Dr. Leeroy Cha   Encounter Date: 02/22/2020  OT End of Session - 02/22/20 1728    Visit Number  7    Number of Visits  10    Date for OT Re-Evaluation  03/14/20    Authorization Type  CCME Approved 9 visits 3/11-4/21/21    Authorization - Visit Number  7    Authorization - Number of Visits  9    OT Start Time  1400    OT Stop Time  1445    OT Time Calculation (min)  45 min    Activity Tolerance  Patient tolerated treatment well    Behavior During Therapy  [facility] for tasks assessed/performed       Past Medical History:  Diagnosis Date  . 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 Depression   . Hemorrhagic stroke ([place])   . Kidney stones     Past Surgical History:  Procedure Laterality Date  . CRANIOTOMY Right 07/19/2019   Procedure: RIGHT HEMI-CRANIECTOMY With implantation of skull flap to abdominal wall;  Surgeon: Consuella Lose, MD;  Location: [facility];  Service: Neurosurgery;  Laterality: Right;  . CYST EXCISION N/A 10/08/2016   Procedure: EXCISION OF POSTERIOR NECK CYST;  Surgeon: Clovis Riley, MD;  [facility];  Service: General;  Laterality: N/A;  . INCISION AND DRAINAGE ABSCESS N/A 09/22/2014   Procedure: INCISION AND DRAINAGE ABSCESS POSTERIOR NECK;  Surgeon: Pedro Earls, MD;  [facility];  Service: General;  Laterality: N/A;  . INCISION AND DRAINAGE ABSCESS N/A 12/20/2015   Procedure: INCISION AND DRAINAGE POSTERIOR NECK MASS;  Surgeon: Armandina Gemma, MD;  [facility];  Service: General;  Laterality: N/A;  . INCISION AND DRAINAGE ABSCESS Left 07/10/2004   middle finger  . IR IVC FILTER PLMT / S&I /IMG GUID/MOD SED  07/21/2019  . IR RADIOLOGIST EVAL & MGMT   12/14/2019  . IR VENOGRAM RENAL UNI RIGHT  07/21/2019  . MASS EXCISION N/A 07/21/2017   Procedure: EXCISION OF BENIGN NECK LESION WITH LAYERED CLOSURE;  Surgeon: Irene Limbo, MD;  Location: [facility];  Service: Plastics;  Laterality: N/A;  . MASS EXCISION N/A 11/10/2017   Procedure: EXCISION BENIGN LESION OF THE NECK WITH LAYERED CLOSURE;  Surgeon: Irene Limbo, MD;  Location: [facility];  Service: Plastics;  Laterality: N/A;    There were no vitals filed for this visit.  Subjective Assessment - 02/22/20 1721    Subjective   i am good, how are you doing?    Currently in Pain?  No/denies    Pain Score  0-No pain                   OT Treatments/Exercises (OP) - 02/22/20 0001      ADLs   LB Dressing  Patient's wife unable to attend session today.  Patient indicates that he has been doing more of his LB dressing.  Will confirm with patient's wife next visit.        Neurological Re-education Exercises   Other Exercises 1  Neuromuscular reeducation to address stand balance, and simple left right weight shifts to allow pivot transfer as needed for toileting at home.  Patient requires nearly constant cueing and facilitation to maintain  activation in his left leg.  Patient with strong right bias in standing.  Worked to orient and activate in midline during transition to stand so left leg would be weighted immediately upon standing.  Worked in standing at parallel bars to allow UE surface to help with security for patient.  Worked to activate left leg sufficiently to step right foot toward right as needed for toileting transfer.  Wife indicates this is very challenging at home.               OT Education - 02/22/20 1727    Education Details  midline orientation and more sustained activation in left leg.    Person(s) Educated  Patient    Methods  Explanation;Demonstration;Tactile cues;Verbal cues    Comprehension  Need further instruction        OT Short Term Goals - 02/16/20 1548      OT SHORT TERM GOAL #1   Title  Pt will perform HEP for ROM with wife assistance prn--check STGs 02/27/20    Status  On-going      OT SHORT TERM GOAL #2   Title  Pt will preform UB dressing with min A.    Status  Achieved      OT SHORT TERM GOAL #3   Title  Pt/wife will verbalize understanding of proper positioning of LUE to decr risk of pain and manage spasticity (including updates to splint prn).    Status  Achieved      OT SHORT TERM GOAL #4   Title  Pt will demo at least 70* L shoulder flex PROM without pain to assist with ADLs.    Status  Achieved        OT Long Term Goals - 02/02/20 1450      OT LONG TERM GOAL #1   Title  Pt/wife will be independent with updated HEP.--check LTGs 03/19/20    Status  On-going      OT LONG TERM GOAL #2   Title  Pt will perform bathing with min A.    Status  On-going      OT LONG TERM GOAL #3   Title  Pt will demo at least 90* L shoulder flex PROM without pain for incr ease with ADLs    Status  On-going      OT LONG TERM GOAL #4   Title  Pt will perform LB dressing with min A.    Status  On-going      OT LONG TERM GOAL #5   Title  Pt will be able to use LUE as stabilizer for selected tasks with min A.    Status  On-going            Plan - 02/22/20 1729    Clinical Impression Statement  Patient with improved static stand balance, although very strong right bias.  Patient with limited visits- emphasis on family education and reduction of shoulder pain.    OT Frequency  2x / week    OT Treatment/Interventions  Self-care/ADL training;DME and/or AE instruction;Fluidtherapy;Moist Heat;Splinting;Balance training;Therapeutic activities;Aquatic Therapy;Ultrasound;Therapeutic exercise;Cognitive remediation/compensation;Passive range of motion;Functional Mobility Training;Neuromuscular education;Cryotherapy;Manual Therapy;Patient/family education;Visual/perceptual  remediation/compensation;Energy conservation;Electrical Stimulation    Plan  Continue with HEP as wife available, ADL, functional transfers, bed positioning- shoulder pain options for support    Consulted and Agree with Plan of Care  Patient       Patient will benefit from skilled therapeutic intervention in order to improve the following deficits and impairments:  Visit Diagnosis: Muscle weakness (generalized)  Hemiplegia and hemiparesis following cerebral infarction affecting left non-dominant side (HCC)  Chronic left shoulder pain  Other disturbances of skin sensation  Unsteadiness on feet  Stiffness of left shoulder, not elsewhere classified  Unspecified symptoms and signs involving cognitive functions following cerebral infarction    Problem List Patient Active Problem List   Diagnosis Date Noted  . Peri-rectal abscess 02/14/2020  . Abnormal CT scan, pelvis 02/14/2020  . Pancytopenia ([place]) 12/02/2019  . Nephrolithiasis 12/02/2019  . Hydronephrosis with renal and ureteral calculus obstruction 12/02/2019  . Rectal pain 12/02/2019  . Hyperkalemia 12/02/2019  . Reactive depression   . Wound infection after surgery   . Sleep disturbance   . Dysphagia, post-stroke   . Transaminitis   . 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 Cerebral abscess   . Urinary tract infection without hematuria   . Altered mental status   . Primary hypercoagulable state ([place])   . Acute pulmonary embolism without acute cor pulmonale (HCC)   . Deep vein thrombosis (DVT) of non-extremity vein   . Hypokalemia   . Acute blood loss anemia   . Leukocytosis   . Endotracheal tube present   . Acute respiratory failure with hypoxemia ([place])   . Stroke (cerebrum) ([place]) 07/19/2019  . Pressure injury of skin 07/19/2019  . Acute CVA (cerebrovascular accident) ([place])   . Encephalopathy   . Dysphagia   . Acute encephalopathy   . Essential hypertension   . Obesity 03/12/2016   . Scalp abscess 12/20/2015  . Neck abscess 12/20/2015  . Pilonidal cyst 02/08/2013    Mariah Milling, OTR/L 02/22/2020, 5:31 PM  [facility] [address] [place], [place], [address] Phone: [phone]   Fax:  [phone]  Name: BRON AARONS MRN: [id number] Date of Birth: 1976-05-21

## 2020-02-22 NOTE — Telephone Encounter (Signed)
Dr. Ranell Patrick, Thank you so much for your quick response to the AFO request on Vedansh [place]. I saw him in the clinic today with the orthotist from [facility], Meghan, present and we decided a custom plastic AFO will be most beneficial for him. He will be trying to get in with you as needs a face to face with MD to justify why he would need a brace for insurance to pay. Thanks again for your help! Cherly Anderson, PT, DPT, NCS

## 2020-02-22 NOTE — Telephone Encounter (Signed)
PT received a call from Dr. Aline August office as patient had called to try to schedule orthotist consult and they thought pt had one today from chart. PT explained that pt had orthotist come and we decided on appropriate AFO in PT session today but pt needed face to face consult with Dr. Ranell Patrick to justify need for brace. They will try to move up visit from 4/20 if possible.

## 2020-02-22 NOTE — Telephone Encounter (Signed)
Excellent, thank you so much.

## 2020-02-22 NOTE — Therapy (Signed)
[facility] [address] [place], [place], [address] Phone: [phone]   Fax:  [phone]  Physical Therapy Treatment  Patient Details  Name: Matthew Black MRN: [id number] Date of Birth: 1976-07-04 Referring Provider (PT): Dr. Naaman Plummer but f/u with Dr. Ranell Patrick   Encounter Date: 02/22/2020  PT End of Session - 02/22/20 1448    Visit Number  10    Number of Visits  16    Date for PT Re-Evaluation  03/19/20    Authorization Type  Medicaid 3 visits approved 12/30/19-01/19/20; Medicaid extension approved for 12 PT visits: 02/02/20-03/28/20    Authorization - Visit Number  5   pt's initial MCD auth for 3 visits met on 01/19/20, new auth for 12 visits (02/02/20-03/28/20   Authorization - Number of Visits  12    PT Start Time  6962    PT Stop Time  1526    PT Time Calculation (min)  41 min    Equipment Utilized During Treatment  Gait belt    Activity Tolerance  Patient limited by pain    Behavior During Therapy  Flat affect       Past Medical History:  Diagnosis Date  . 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 Depression   . Hemorrhagic stroke ([place])   . Kidney stones     Past Surgical History:  Procedure Laterality Date  . CRANIOTOMY Right 07/19/2019   Procedure: RIGHT HEMI-CRANIECTOMY With implantation of skull flap to abdominal wall;  Surgeon: Consuella Lose, MD;  Location: [facility];  Service: Neurosurgery;  Laterality: Right;  . CYST EXCISION N/A 10/08/2016   Procedure: EXCISION OF POSTERIOR NECK CYST;  Surgeon: Clovis Riley, MD;  Location: [facility];  Service: General;  Laterality: N/A;  . INCISION AND DRAINAGE ABSCESS N/A 09/22/2014   Procedure: INCISION AND DRAINAGE ABSCESS POSTERIOR NECK;  Surgeon: Pedro Earls, MD;  Location: [facility];  Service: General;  Laterality: N/A;  . INCISION AND DRAINAGE ABSCESS N/A 12/20/2015   Procedure: INCISION AND DRAINAGE POSTERIOR NECK MASS;  Surgeon: Armandina Gemma, MD;  Location: [facility];   Service: General;  Laterality: N/A;  . INCISION AND DRAINAGE ABSCESS Left 07/10/2004   middle finger  . IR IVC FILTER PLMT / S&I /IMG GUID/MOD SED  07/21/2019  . IR RADIOLOGIST EVAL & MGMT  12/14/2019  . IR VENOGRAM RENAL UNI RIGHT  07/21/2019  . MASS EXCISION N/A 07/21/2017   Procedure: EXCISION OF BENIGN NECK LESION WITH LAYERED CLOSURE;  Surgeon: Irene Limbo, MD;  Location: [facility];  Service: Plastics;  Laterality: N/A;  . MASS EXCISION N/A 11/10/2017   Procedure: EXCISION BENIGN LESION OF THE NECK WITH LAYERED CLOSURE;  Surgeon: Irene Limbo, MD;  Location: [facility];  Service: Plastics;  Laterality: N/A;    There were no vitals filed for this visit.  Subjective Assessment - 02/22/20 1448    Subjective  Pt reports doing well. Orthotist from [facility], [place], present during session to assist with orthotic fit and screening.    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  No/denies    Pain Onset  More than a month ago                       [facility] Adult PT Treatment/Exercise - 02/22/20 1514      Ambulation/Gait   Ambulation/Gait  Yes    Ambulation/Gait Assistance  4: Min guard;4: Min assist    Ambulation/Gait Assistance Details  Pt ambulated in // bars first without AFO for orthotist  to observe gait without AD then with left AFO donned. Pt was given cues to increase right step length past left foot to help facilitate weight shift. Noted improvement in weight shift with this but patient was having some instability noted at left knee with stance with increased flexion. With AFO added less knee flexion in stance but some clonus noted with mild hyperextension at times.  Pt was also cued to decreased left step length.     Ambulation Distance (Feet)  24 Feet   79' with left ottobock anterior ground reaction AFO donned   Assistive device  Parallel bars    Gait Pattern  Step-to pattern;Step-through pattern;Decreased hip/knee flexion - left;Decreased stance time - left;Decreased weight shift to left;Left flexed knee in stance   left foot inverts with swing   Ambulation Surface  Level;Indoor    Gait Comments  Discussed different brace options with orthotist and patient and wife. Would like to have a brace that would allow more conrol at knee as well as ankle as foot inverts with swing. PT concerned with carbon graphite not providing enough support and contributing to more clonus on left.  Discussed pros and cons of carbon graphite vs custom plastic vs metal double upright. Decided on custom plastic as will also give option to add motion to ankle as othotist can cut if need be in the future. Pt and wife in agreement with custom plastic AFO. Discussed need for larger size shoe as well as more width. Advised wife to wait on obtaining new shoe until just before brace due and to not wear until fitting to make sure it will work in shoe so she can return/exchange as needed.             PT Education - 02/22/20 1550    Education Details  Discussed AFO plan    Person(s) Educated  Patient    Methods  Explanation    Comprehension  Verbalized understanding       PT Short Term Goals - 02/22/20 1551      PT SHORT TERM GOAL #1   Title  Pt will be independent with HEP for strengthening, balance  and flexibility with assist of wife to continue gains on own.    Baseline  Pt has started initial strengthening HEP with assist of wife for strengthening, weight shifting and flexibility. Continue to progress.    Time  4    Period  Weeks    Status  On-going    Target Date  02/18/20      PT SHORT TERM GOAL #2   Title  Pt will be min assist for bed mobility for improved mobility.    Baseline  min assist with bed mobility, still require some assistance with LLE.  Time  4    Period  Weeks    Status  Achieved    Target Date  02/18/20      PT SHORT TERM GOAL #3   Title  Pt will perform transfers CGA for improved mobility.    Baseline  CGA/ min assist with stand/pivot and sit to stand transfers.    Time  4    Period  Weeks    Status  On-going    Target Date  02/18/20      PT SHORT TERM GOAL #4   Title  Pt will ambulate 20' with hemiwalker versus quad cane mod assist for improved mobility.    Baseline  67' with large based quad cane mod assist    Time  4    Period  Weeks    Status  Achieved    Target Date  02/18/20      PT SHORT TERM GOAL #5   Title  Pt will have orthotic fitting for left AFO after trialed in clinic    Baseline  Pt had orthotist consult today in session and plan to pursue custom plastic AFO. Needs face to face with MD now.    Time  4    Period  Weeks    Status  On-going    Target Date  02/18/20        PT Long Term Goals - 02/22/20 1552      PT LONG TERM GOAL #1   Title  Pt will be supervision with bed mobility for improved function in home.    Baseline  min assist with bed mobility, still require some assistance with LLE.    Time  8    Period  Weeks    Status  New      PT LONG TERM GOAL #2   Title  Pt will be supervision with transfers for improved mobility.    Baseline  CGA/min assist    Time  8    Period  Weeks    Status  New      PT LONG TERM GOAL #3   Title  Pt will ambulate >100' with LRAD min assist for improved mobility.    Baseline  39'  with LBQC mod assist    Time  8    Period  Weeks    Status  New      PT LONG TERM GOAL #4   Title  Pt will maintain standing x 5 min with minimal UE support supervision for improved balance and ADLs.    Baseline  min assist for 1-2 min in // bars.    Time  8    Period  Weeks    Status  New            Plan - 02/22/20 1553    Clinical Impression Statement  PT session today involved orthotic assessment with orthotist present. Have decided to pursue custom plastic AFO on left to allow for more options down the line as well as provide more control at left ankle and knee to help facilitate left weight shift more with gait. Pt has been showing progress towards all goals with less assistance required with transfers, bed mobility and gait when assessed at last visit. Continues to benefit from skilled PT to address strength, balance and functional mobility deficits. Will most likely put patient on hold until after he receives AFO to further progress gait training with new brace.    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 08/20/19 w aspiration 08/21/19, (tx empircally w vanco/ cefepime), LLE DVT 07/21/19 and small left lower lung PE 08/01/19 s/p IVC filter 07/21/19, iv heparin discontinued due to worsening ICH 08/14/19, and abdominal wall hematoma, seizure, pseudomonas uti,    Examination-Activity Limitations  Bathing;Sit;Bed Mobility;Bend;Squat;Caring for Others;Stairs;Stand;Carry;Toileting;Dressing;Transfers;Hygiene/Grooming;Locomotion Level    Examination-Participation Restrictions  Driving;Other;Meal Prep   running business   Stability/Clinical Decision Making  Evolving/Moderate complexity    Rehab Potential   Good    PT Frequency  2x / week   followed by 1x/week for 4 weeks   PT Duration  4 weeks    PT Treatment/Interventions  ADLs/Self Care Home Management;Aquatic Therapy;Biofeedback;Balance training;Therapeutic exercise;Therapeutic activities;Functional mobility training;Wheelchair mobility training;Orthotic Fit/Training;Stair training;Gait training;DME Instruction;Patient/family education;Cognitive remediation;Neuromuscular re-education;Vestibular;Manual techniques;Passive range of motion;Cryotherapy;Moist Heat    PT Next Visit Plan  Place on hold until after receives custom AFO to allow for further gait training. PT will call wife to notify of decision but did discuss in session but wanted to make sure of visit count.    Recommended Other Services  PT spoke with OT, Vania Rea, to suggest holding until after brace received and she was in agreement.    Consulted and Agree with Plan of Care  Patient;Family member/caregiver       Patient will benefit from skilled therapeutic intervention in order to improve the following deficits and impairments:  Abnormal gait, Difficulty walking, Decreased mobility, Decreased balance, Decreased cognition, Decreased range of motion, Decreased coordination, Decreased safety awareness, Impaired flexibility, Postural dysfunction, Decreased knowledge of use of DME, Decreased strength, Impaired UE functional use, Pain, Impaired tone, Impaired sensation, Decreased endurance(PT will monitor pain closely but not directly treat)  Visit Diagnosis: Hemiplegia and hemiparesis following cerebral infarction affecting left non-dominant side (HCC)  Muscle weakness (generalized)  Other abnormalities of gait and mobility     Problem List Patient Active Problem List   Diagnosis Date Noted  . Peri-rectal abscess 02/14/2020  . Abnormal CT scan, pelvis 02/14/2020  . Pancytopenia ([place]) 12/02/2019  . Nephrolithiasis 12/02/2019  . Hydronephrosis with renal and ureteral calculus  obstruction 12/02/2019  . Rectal pain 12/02/2019  . Hyperkalemia 12/02/2019  . Reactive depression   . Wound infection after surgery   . Sleep disturbance   . Dysphagia, post-stroke   . Transaminitis   . 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 Cerebral abscess   . Urinary tract infection without hematuria   . Altered mental status   . Primary hypercoagulable state ([place])   . Acute pulmonary embolism without acute cor pulmonale (HCC)   . Deep vein thrombosis (DVT) of non-extremity vein   . Hypokalemia   . Acute blood loss anemia   . Leukocytosis   . Endotracheal tube present   . Acute respiratory failure with hypoxemia ([place])   . Stroke (cerebrum) ([place]) 07/19/2019  . Pressure injury of skin 07/19/2019  . Acute CVA (cerebrovascular accident) ([place])   . Encephalopathy   . Dysphagia   . Acute encephalopathy   . Essential hypertension   . Obesity 03/12/2016  . Scalp abscess 12/20/2015  . Neck abscess 12/20/2015  . Pilonidal cyst 02/08/2013    Electa Sniff, PT, DPT, NCS 02/22/2020, 4:09 PM  [facility] [address]  [place], [place], [address] Phone: [phone]   Fax:  [phone]  Name: LEROI HAQUE MRN: [id number] Date of Birth: 10-24-1976

## 2020-02-23 ENCOUNTER — Encounter (HOSPITAL_COMMUNITY): Payer: Self-pay

## 2020-02-23 ENCOUNTER — Telehealth: Payer: Self-pay

## 2020-02-23 NOTE — Telephone Encounter (Signed)
PT called and left message on patient's wife's voicemail about trying to put PT and OT on hold until after he gets new AFO. Asked wife to call back to let us know she got the message and will hold off cancelling visits until we hear back.  Cherly Anderson, PT, DPT, NCS

## 2020-02-27 ENCOUNTER — Ambulatory Visit (HOSPITAL_COMMUNITY): Payer: Medicaid Other

## 2020-02-27 ENCOUNTER — Other Ambulatory Visit: Payer: Self-pay

## 2020-02-27 MED ORDER — FAMOTIDINE 20 MG PO TABS: 20.0000 mg | ORAL_TABLET | Freq: Two times a day (BID) | ORAL | 0 refills | Status: DC

## 2020-02-28 ENCOUNTER — Ambulatory Visit: Payer: Medicaid Other | Admitting: Occupational Therapy

## 2020-02-28 ENCOUNTER — Ambulatory Visit: Payer: Medicaid Other | Admitting: Physical Therapy

## 2020-03-01 ENCOUNTER — Other Ambulatory Visit: Payer: Self-pay | Admitting: Physical Medicine and Rehabilitation

## 2020-03-01 ENCOUNTER — Telehealth: Payer: Self-pay

## 2020-03-01 MED ORDER — FAMOTIDINE 20 MG PO TABS: 20.0000 mg | ORAL_TABLET | Freq: Two times a day (BID) | ORAL | 3 refills | Status: DC

## 2020-03-01 NOTE — Telephone Encounter (Signed)
Pharmacy sent a fax requesting Famotidine be a 90 day supply instead of monthly.

## 2020-03-02 ENCOUNTER — Other Ambulatory Visit: Payer: Self-pay | Admitting: Urology

## 2020-03-02 ENCOUNTER — Telehealth: Payer: Self-pay | Admitting: *Deleted

## 2020-03-02 NOTE — Telephone Encounter (Addendum)
Prior authorization submitted to [facility] for tramadol.  Approved  03/05/2020 - 09/01/2020

## 2020-03-07 ENCOUNTER — Ambulatory Visit: Payer: Medicaid Other

## 2020-03-07 ENCOUNTER — Encounter (HOSPITAL_COMMUNITY): Payer: Self-pay

## 2020-03-07 ENCOUNTER — Encounter: Payer: Medicaid Other | Admitting: Occupational Therapy

## 2020-03-07 DIAGNOSIS — Z7689 Persons encountering health services in other specified circumstances: Secondary | ICD-10-CM | POA: Diagnosis not present

## 2020-03-07 DIAGNOSIS — N200 Calculus of kidney: Secondary | ICD-10-CM | POA: Diagnosis not present

## 2020-03-07 NOTE — Progress Notes (Addendum)
PCP - Phill Myron Cardiologist -  Neuro surgeon - Dr. Kathyrn Sheriff  Chest x-ray -  EKG - 08-02-19 epic Stress Test -  ECHO - 07-20-19 epic Cardiac Cath -   Sleep Study -  CPAP -   Fasting Blood Sugar -  Checks Blood Sugar _____ times a day  Blood Thinner Instructions: Aspirin Instructions: Last Dose:  Anesthesia review: Hemmorhagic stroke 06-2019 left side paralysis  WC bound  Patient denies shortness of breath, fever, cough and chest pain at PAT appointment  NONE   Patient verbalized understanding of instructions that were given to them at the PAT appointment. Patient was also instructed that they will need to review over the PAT instructions again at home before surgery.

## 2020-03-07 NOTE — Patient Instructions (Addendum)
DUE TO COVID-19 ONLY ONE VISITOR IS ALLOWED TO COME WITH YOU AND STAY IN THE WAITING ROOM ONLY DURING PRE OP AND PROCEDURE DAY OF SURGERY. Two  VISITOR MAY VISIT WITH YOU AFTER SURGERY IN YOUR PRIVATE ROOM DURING VISITING HOURS ONLY!   10-8p  YOU NEED TO HAVE A COVID 19 TEST ON__4-20-21_____ @_1 :35 pm______, THIS TEST MUST BE DONE BEFORE SURGERY, COME  [address], [place] , [address].  ([facility]) ONCE YOUR COVID TEST IS COMPLETED, PLEASE BEGIN THE QUARANTINE INSTRUCTIONS AS OUTLINED IN YOUR HANDOUT.                LINDAN BROTHERS  03/07/2020   Your procedure is scheduled on: 03-16-20   Report to [facility] Main  Entrance   Report to admitting at     1045  AM     Call this number if you have problems the morning of surgery [phone]    Remember: Do not eat food or drink liquids :After Midnight.   BRUSH YOUR TEETH MORNING OF SURGERY AND RINSE YOUR MOUTH OUT, NO CHEWING GUM CANDY OR MINTS.     Take these medicines the morning of surgery with A SIP OF WATER: metoprolol, pepcid, baclofen                                 You may not have any metal on your body including hair pins and              piercings  Do not wear jewelry,  lotions, powders or perfumes, deodorant              Men may shave face and neck.   Do not bring valuables to the hospital. [place].  Contacts, dentures or bridgework may not be worn into surgery.  Leave suitcase in the car. After surgery it may be brought to your room.     Patients discharged the day of surgery will not be allowed to drive home. IF YOU ARE HAVING SURGERY AND GOING HOME THE SAME DAY, YOU MUST HAVE AN ADULT TO DRIVE YOU HOME AND BE WITH YOU FOR 24 HOURS. YOU MAY GO HOME BY TAXI OR UBER OR ORTHERWISE, BUT AN ADULT MUST ACCOMPANY YOU HOME AND STAY WITH YOU FOR 24 HOURS.  Name and phone number of your driver:  Special Instructions: N/A              Please  read over the following fact sheets you were given: _____________________________________________________________________             [facility] - Preparing for Surgery Before surgery, you can play an important role.  Because skin is not sterile, your skin needs to be as free of germs as possible.  You can reduce the number of germs on your skin by washing with CHG (chlorahexidine gluconate) soap before surgery.  CHG is an antiseptic cleaner which kills germs and bonds with the skin to continue killing germs even after washing. Please DO NOT use if you have an allergy to CHG or antibacterial soaps.  If your skin becomes reddened/irritated stop using the CHG and inform your nurse when you arrive at Short Stay. Do not shave (including legs and underarms) for at least 48 hours prior to the first CHG shower.  You may shave your face/neck. Please follow these instructions carefully:  1.  Shower with CHG Soap the night before surgery and the  morning of Surgery.  2.  If you choose to wash your hair, wash your hair first as usual with your  normal  shampoo.  3.  After you shampoo, rinse your hair and body thoroughly to remove the  shampoo.                           4.  Use CHG as you would any other liquid soap.  You can apply chg directly  to the skin and wash                       Gently with a scrungie or clean washcloth.  5.  Apply the CHG Soap to your body ONLY FROM THE NECK DOWN.   Do not use on face/ open                           Wound or open sores. Avoid contact with eyes, ears mouth and genitals (private parts).                       Wash face,  Genitals (private parts) with your normal soap.             6.  Wash thoroughly, paying special attention to the area where your surgery  will be performed.  7.  Thoroughly rinse your body with warm water from the neck down.  8.  DO NOT shower/wash with your normal soap after using and rinsing off  the CHG Soap.                9.  Pat yourself dry  with a clean towel.            10.  Wear clean pajamas.            11.  Place clean sheets on your bed the night of your first shower and do not  sleep with pets. Day of Surgery : Do not apply any lotions/deodorants the morning of surgery.  Please wear clean clothes to the hospital/surgery center.  FAILURE TO FOLLOW THESE INSTRUCTIONS MAY RESULT IN THE CANCELLATION OF YOUR SURGERY PATIENT SIGNATURE_________________________________  NURSE SIGNATURE__________________________________  ________________________________________________________________________

## 2020-03-08 ENCOUNTER — Encounter
Payer: Medicaid Other | Attending: Physical Medicine and Rehabilitation | Admitting: Physical Medicine and Rehabilitation

## 2020-03-08 ENCOUNTER — Encounter: Payer: Self-pay | Admitting: Physical Medicine and Rehabilitation

## 2020-03-08 ENCOUNTER — Other Ambulatory Visit: Payer: Self-pay

## 2020-03-08 VITALS — BP 118/81 | HR 78 | Temp 97.5°F | Ht 74.0 in | Wt 190.0 lb

## 2020-03-08 DIAGNOSIS — R252 Cramp and spasm: Secondary | ICD-10-CM | POA: Insufficient documentation

## 2020-03-08 DIAGNOSIS — Z87891 Personal history of nicotine dependence: Secondary | ICD-10-CM | POA: Insufficient documentation

## 2020-03-08 DIAGNOSIS — I69352 Hemiplegia and hemiparesis following cerebral infarction affecting left dominant side: Secondary | ICD-10-CM

## 2020-03-08 DIAGNOSIS — I63511 Cerebral infarction due to unspecified occlusion or stenosis of right middle cerebral artery: Secondary | ICD-10-CM | POA: Diagnosis not present

## 2020-03-08 DIAGNOSIS — Z8673 Personal history of transient ischemic attack (TIA), and cerebral infarction without residual deficits: Secondary | ICD-10-CM | POA: Diagnosis not present

## 2020-03-08 DIAGNOSIS — Z79899 Other long term (current) drug therapy: Secondary | ICD-10-CM | POA: Insufficient documentation

## 2020-03-08 DIAGNOSIS — R519 Headache, unspecified: Secondary | ICD-10-CM | POA: Insufficient documentation

## 2020-03-08 DIAGNOSIS — G8918 Other acute postprocedural pain: Secondary | ICD-10-CM | POA: Insufficient documentation

## 2020-03-08 DIAGNOSIS — R4584 Anhedonia: Secondary | ICD-10-CM | POA: Insufficient documentation

## 2020-03-08 DIAGNOSIS — R531 Weakness: Secondary | ICD-10-CM | POA: Diagnosis not present

## 2020-03-08 MED ORDER — DOCUSATE SODIUM 100 MG PO CAPS: 100.0000 mg | ORAL_CAPSULE | Freq: Two times a day (BID) | ORAL | 0 refills | Status: DC

## 2020-03-08 NOTE — Progress Notes (Signed)
Subjective:    Patient ID: Matthew Black, male    DOB: [birth year], 44 y.o.   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 His wife accompanies him and provides most of the history.  No longer has pain at surgical site. Wife asks if he should have repeat CT scan. No longer has headache.   Has been having some right sided abdominal pain. Has kidney stone. Has been having daily BM but they are hard. Also has gas.   He has completed his PT and is able to ambulate around his home.   His wife says that prior to his stroke, he liked to play basketball with their kids (ages 75 and 32), play with their dog, travel, and relax at home. He has told her that he is happy to be at home.  He has had tightness in his right elbow and knee flexors and this has been painful to him. He has had two falls and his leg stiffness may have contributed to this.   Pain Inventory Average Pain 0 Pain Right Now 0 My pain is intermittent and sharp  In the last 24 hours, has pain interfered with the following? General activity 0 Relation with others 3 Enjoyment of life 5 What TIME of day is your pain at its worst? morning Sleep (in general) Poor  Pain is worse with: walking Pain improves with: na Relief from Meds: 0  Mobility walk without assistance walk with assistance use a walker ability to climb steps?  no do you drive?  no use a wheelchair needs help with transfers  Function I need assistance with the following:  meal prep, household duties and shopping  Neuro/Psych weakness trouble walking depression  Prior Studies Any changes since last visit?  yes  [address] and hurt leg, reported it was evaluated by an MD  Physicians involved in your care Any changes since last visit?  no   Family History  Problem Relation Age of Onset  . Healthy Mother   . Healthy Father   . Clotting disorder Maternal  Grandmother   . Diabetes Paternal Grandfather   . Clotting disorder Maternal Aunt   . Clotting disorder Maternal Uncle   . Colon cancer Neg Hx    Social History   Socioeconomic History  . Marital status: Married    Spouse name: Not on file  . Number of children: 2  . Years of education: Not on file  . Highest education level: Not on file  Occupational History  . Occupation: Clinical biochemist   Tobacco Use  . Smoking status: Former Smoker    Packs/day: 0.00    Quit date: 11/24/2015    Years since quitting: 4.2  . Smokeless tobacco: Never Used  . Tobacco comment:    Substance and Sexual Activity  . Alcohol use: Not Currently  . Drug use: Never  . Sexual activity: Not on file  Other Topics Concern  . Not on file  Social History Narrative   Household-- pt, wife, 2 children   Social Determinants of Health   Financial Resource Strain:   . Difficulty of Paying Living Expenses:   Food Insecurity:   . Worried About Charity fundraiser in the Last Year:   . Arboriculturist in the Last Year:   Transportation Needs:   . Film/video editor (Medical):   Marland Kitchen Lack of Transportation (Non-Medical):  Physical Activity:   . Days of Exercise per Week:   . Minutes of Exercise per Session:   Stress:   . Feeling of Stress :   Social Connections:   . Frequency of Communication with Friends and Family:   . Frequency of Social Gatherings with Friends and Family:   . Attends Religious Services:   . Active Member of Clubs or Organizations:   . Attends Archivist Meetings:   Marland Kitchen Marital Status:    Past Surgical History:  Procedure Laterality Date  . CRANIOTOMY Right 07/19/2019   Procedure: RIGHT HEMI-CRANIECTOMY With implantation of skull flap to abdominal wall;  Surgeon: Consuella Lose, MD;  Location: [facility];  Service: Neurosurgery;  Laterality: Right;  . CYST EXCISION N/A 10/08/2016   Procedure: EXCISION OF POSTERIOR NECK CYST;  Surgeon: Clovis Riley, MD;  Location:  [facility];  Service: General;  Laterality: N/A;  . INCISION AND DRAINAGE ABSCESS N/A 09/22/2014   Procedure: INCISION AND DRAINAGE ABSCESS POSTERIOR NECK;  Surgeon: Pedro Earls, MD;  Location: [facility];  Service: General;  Laterality: N/A;  . INCISION AND DRAINAGE ABSCESS N/A 12/20/2015   Procedure: INCISION AND DRAINAGE POSTERIOR NECK MASS;  Surgeon: Armandina Gemma, MD;  Location: [facility];  Service: General;  Laterality: N/A;  . INCISION AND DRAINAGE ABSCESS Left 07/10/2004   middle finger  . IR IVC FILTER PLMT / S&I /IMG GUID/MOD SED  07/21/2019  . IR RADIOLOGIST EVAL & MGMT  12/14/2019  . IR VENOGRAM RENAL UNI RIGHT  07/21/2019  . MASS EXCISION N/A 07/21/2017   Procedure: EXCISION OF BENIGN NECK LESION WITH LAYERED CLOSURE;  Surgeon: Irene Limbo, MD;  Location: [facility];  Service: Plastics;  Laterality: N/A;  . MASS EXCISION N/A 11/10/2017   Procedure: EXCISION BENIGN LESION OF THE NECK WITH LAYERED CLOSURE;  Surgeon: Irene Limbo, MD;  Location: [facility];  Service: Plastics;  Laterality: N/A;   Past Medical History:  Diagnosis Date  . 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 Depression   . Hemorrhagic stroke ([place])   . History of kidney stones    BP 118/81   Pulse 78   Temp (!) 97.5 F (36.4 C)   Ht 6\' 2"  (1.88 m)   Wt 190 lb (86.2 kg) Comment: last recorded  SpO2 97%   BMI 24.39 kg/m   Opioid Risk Score:   Fall Risk Score:  `1  Depression screen PHQ 2/9  Depression screen [facility] 2/9 03/08/2020 09/11/2016 03/11/2016  Decreased Interest 3 0 0  Down, Depressed, Hopeless 3 0 0  PHQ - 2 Score 6 0 0     Review of Systems  Constitutional: Negative.   HENT: Negative.   Eyes: Negative.   Respiratory: Negative.   Cardiovascular: Negative.   Gastrointestinal: Negative.   Genitourinary: Negative.   Musculoskeletal: Positive for gait problem.  Skin: Negative.   Allergic/Immunologic: Negative.   Neurological: Positive for weakness.  Hematological:  Negative.   Psychiatric/Behavioral: Positive for dysphoric mood.  All other systems reviewed and are negative.      Objective:   Physical Exam Constitutional: No distress . Vital signs reviewed. HEENT: EOMI, oral membranes moist Neck: supple Cardiovascular: RRR without murmur. No JVD  Respiratory: CTA Bilaterally without wheezes or rales. Normal effort  GI: BS +, non-tender, non-distended  Psych:Gives me thumbs up in response to questions, and shakes head no at times. At his wife's prompting to speak he said "mask" and pointed to indicate that his mask  was causing discomfort.  Musc: No edema in extremities. No tenderness in extremities. Neuro: 0/5 LUE and LLE. Left inattention persists, tone 2 LUE and LLE elbow and knee flexors- increased from las visit.  RUE/RLE appear to be 5/5 proximal distal--stable     Assessment & Plan:  Mr. Tesfay is a a 44 year old man who presents for follow-up following his right middle cerebral artery stroke. His wife accompanies him and provides most of the history.  Headache -Resolved. Advised that he does not required repeat CT Head unless he develops symptoms and signs of infection or bleed--discussed which to look out for.   Abdominal pain: -Could be secondary to constipation given hard stool. Recommend Senna two tablets HS to help him have a BM after breakfast. -Could be related to kidney stone. He is scheduled for surgical removal on 4/23.  -Reviewed recent CT abdomen from January. Appears that fluids collection under bone flap had resolved on this study.  Weakness/spasticity: -Advised continuation of baclofen and home physical therapy. Recommended that wife and both kids participate in daily range of motion exercises for Mr. [place] to prevent contracture formation. Currently most at risk area appears to be left knee flexors. Advised regarding risks and benefits of Botox and wife and patient would like to proceed. Will plan for 300U as  follows: Biceps Brachii: 200 U in 4 sites Flexor Digitorum Profundus: 50U Flexor Digitorum Superficialis: 50U  All questions were encouraged and answered. Follow up with me in1 month for Botox injections.

## 2020-03-09 ENCOUNTER — Encounter (HOSPITAL_COMMUNITY): Payer: Self-pay

## 2020-03-09 ENCOUNTER — Encounter (HOSPITAL_COMMUNITY)
Admission: RE | Admit: 2020-03-09 | Discharge: 2020-03-09 | Disposition: A | Discharge status: Home / Self Care | Payer: Medicaid Other | Source: Ambulatory Visit | Attending: Urology | Admitting: Urology

## 2020-03-09 ENCOUNTER — Other Ambulatory Visit: Payer: Self-pay

## 2020-03-09 DIAGNOSIS — Z01818 Encounter for other preprocedural examination: Secondary | ICD-10-CM | POA: Insufficient documentation

## 2020-03-09 HISTORY — DX: Paralytic syndrome, unspecified: G83.9

## 2020-03-09 HISTORY — DX: Personal history of urinary calculi: Z87.442

## 2020-03-09 HISTORY — DX: Cerebral infarction, unspecified: I63.9

## 2020-03-09 HISTORY — DX: Essential (primary) hypertension: I10

## 2020-03-12 ENCOUNTER — Telehealth: Payer: Self-pay

## 2020-03-12 ENCOUNTER — Ambulatory Visit: Payer: Medicaid Other | Admitting: Physical Medicine and Rehabilitation

## 2020-03-12 NOTE — Telephone Encounter (Signed)
Patients wife called stating that patient had evaluation at [facility] today but they are still waiting on the order for his AFO.

## 2020-03-12 NOTE — Telephone Encounter (Signed)
I didn't realize they were waiting on an order; I will fill out a script tomorrow when I am in clinic!

## 2020-03-13 ENCOUNTER — Encounter (HOSPITAL_COMMUNITY)
Admission: RE | Admit: 2020-03-13 | Discharge: 2020-03-13 | Disposition: A | Discharge status: Home / Self Care | Payer: Medicaid Other | Source: Ambulatory Visit | Attending: Urology | Admitting: Urology

## 2020-03-13 ENCOUNTER — Other Ambulatory Visit: Payer: Self-pay

## 2020-03-13 ENCOUNTER — Other Ambulatory Visit (HOSPITAL_COMMUNITY)
Admission: RE | Admit: 2020-03-13 | Discharge: 2020-03-13 | Disposition: A | Discharge status: Home / Self Care | Payer: Medicaid Other | Source: Ambulatory Visit | Attending: Urology | Admitting: Urology

## 2020-03-13 ENCOUNTER — Ambulatory Visit: Payer: Medicaid Other | Admitting: Physical Medicine and Rehabilitation

## 2020-03-13 DIAGNOSIS — Z01812 Encounter for preprocedural laboratory examination: Secondary | ICD-10-CM | POA: Insufficient documentation

## 2020-03-13 DIAGNOSIS — Z20822 Contact with and (suspected) exposure to covid-19: Secondary | ICD-10-CM | POA: Insufficient documentation

## 2020-03-13 LAB — BASIC METABOLIC PANEL
Anion gap: 10 (ref 5–15)
BUN: 10 mg/dL (ref 6–20)
CO2: 26 mmol/L (ref 22–32)
Calcium: 9.5 mg/dL (ref 8.9–10.3)
Chloride: 109 mmol/L (ref 98–111)
Creatinine, Ser: 0.94 mg/dL (ref 0.61–1.24)
GFR calc Af Amer: >60 mL/min (ref >60)
GFR calc non Af Amer: >60 mL/min (ref >60)
Glucose, Bld: 73 mg/dL (ref 70–99)
Potassium: 3.9 mmol/L (ref 3.5–5.1)
Sodium: 145 mmol/L (ref 135–145)

## 2020-03-13 LAB — CBC
HCT: 48.3 % (ref 39.0–52.0)
Hemoglobin: 15.2 g/dL (ref 13.0–17.0)
MCH: 27 pg (ref 26.0–34.0)
MCHC: 31.5 g/dL (ref 30.0–36.0)
MCV: 85.6 fL (ref 80.0–100.0)
Platelets: 300 10*3/uL (ref 150–400)
RBC: 5.64 MIL/uL (ref 4.22–5.81)
RDW: 14.7 % (ref 11.5–15.5)
WBC: 5.7 10*3/uL (ref 4.0–10.5)
nRBC: 0 % (ref 0.0–0.2)

## 2020-03-13 LAB — SARS CORONAVIRUS 2 (TAT 6-24 HRS): SARS Coronavirus 2: NEGATIVE

## 2020-03-14 ENCOUNTER — Ambulatory Visit: Payer: Medicaid Other | Admitting: Occupational Therapy

## 2020-03-14 ENCOUNTER — Telehealth: Payer: Self-pay

## 2020-03-14 ENCOUNTER — Ambulatory Visit: Payer: Medicaid Other

## 2020-03-14 NOTE — Telephone Encounter (Signed)
PT called and spoke to pt's wife Melene Muller to see where they were on AFO process. They saw MD last week about AFO and were casted at [facility] but waiting on prescription and auth. Was given time frame of about 4 weeks. Will hold all therapy visits until obtains and wife will call back to schedule. Cherly Anderson, PT, DPT, NCS

## 2020-03-15 ENCOUNTER — Telehealth (HOSPITAL_COMMUNITY): Payer: Self-pay | Admitting: *Deleted

## 2020-03-16 ENCOUNTER — Ambulatory Visit (HOSPITAL_COMMUNITY): Payer: Medicaid Other | Admitting: Anesthesiology

## 2020-03-16 ENCOUNTER — Other Ambulatory Visit: Payer: Self-pay

## 2020-03-16 ENCOUNTER — Ambulatory Visit (HOSPITAL_COMMUNITY): Payer: Medicaid Other | Admitting: Physician Assistant

## 2020-03-16 ENCOUNTER — Ambulatory Visit (HOSPITAL_COMMUNITY)
Admission: RE | Admit: 2020-03-16 | Discharge: 2020-03-16 | Disposition: A | Discharge status: Home / Self Care | Payer: Medicaid Other | Source: Ambulatory Visit | Attending: Urology | Admitting: Urology

## 2020-03-16 ENCOUNTER — Encounter (HOSPITAL_COMMUNITY): Payer: Self-pay | Admitting: Urology

## 2020-03-16 ENCOUNTER — Ambulatory Visit (HOSPITAL_COMMUNITY): Payer: Medicaid Other

## 2020-03-16 ENCOUNTER — Encounter (HOSPITAL_COMMUNITY)
Admission: RE | Disposition: A | Discharge status: Home / Self Care | Payer: Self-pay | Source: Ambulatory Visit | Attending: Urology

## 2020-03-16 DIAGNOSIS — Z885 Allergy status to narcotic agent status: Secondary | ICD-10-CM | POA: Insufficient documentation

## 2020-03-16 DIAGNOSIS — Z86711 Personal history of pulmonary embolism: Secondary | ICD-10-CM | POA: Diagnosis not present

## 2020-03-16 DIAGNOSIS — Z8673 Personal history of transient ischemic attack (TIA), and cerebral infarction without residual deficits: Secondary | ICD-10-CM | POA: Insufficient documentation

## 2020-03-16 DIAGNOSIS — G40909 Epilepsy, unspecified, not intractable, without status epilepticus: Secondary | ICD-10-CM | POA: Diagnosis not present

## 2020-03-16 DIAGNOSIS — Z79899 Other long term (current) drug therapy: Secondary | ICD-10-CM | POA: Diagnosis not present

## 2020-03-16 DIAGNOSIS — N201 Calculus of ureter: Secondary | ICD-10-CM | POA: Diagnosis not present

## 2020-03-16 DIAGNOSIS — Z86718 Personal history of other venous thrombosis and embolism: Secondary | ICD-10-CM | POA: Insufficient documentation

## 2020-03-16 DIAGNOSIS — N132 Hydronephrosis with renal and ureteral calculous obstruction: Secondary | ICD-10-CM | POA: Insufficient documentation

## 2020-03-16 DIAGNOSIS — I2609 Other pulmonary embolism with acute cor pulmonale: Secondary | ICD-10-CM | POA: Diagnosis not present

## 2020-03-16 DIAGNOSIS — J9601 Acute respiratory failure with hypoxia: Secondary | ICD-10-CM | POA: Diagnosis not present

## 2020-03-16 DIAGNOSIS — I10 Essential (primary) hypertension: Secondary | ICD-10-CM | POA: Insufficient documentation

## 2020-03-16 DIAGNOSIS — Z8249 Family history of ischemic heart disease and other diseases of the circulatory system: Secondary | ICD-10-CM | POA: Diagnosis not present

## 2020-03-16 HISTORY — PX: CYSTOSCOPY/URETEROSCOPY/HOLMIUM LASER/STENT PLACEMENT: SHX6546

## 2020-03-16 SURGERY — CYSTOSCOPY/URETEROSCOPY/HOLMIUM LASER/STENT PLACEMENT
Anesthesia: General | Site: Urethra | Laterality: Right

## 2020-03-16 MED ORDER — ACETAMINOPHEN 160 MG/5ML PO SOLN: 325.0000 mg | ORAL | Status: DC | PRN

## 2020-03-16 MED ORDER — FENTANYL CITRATE (PF) 100 MCG/2ML IJ SOLN
INTRAMUSCULAR | Status: DC | PRN
  Administered 2020-03-16 (×2): 50 ug via INTRAVENOUS

## 2020-03-16 MED ORDER — CEFAZOLIN SODIUM-DEXTROSE 2-4 GM/100ML-% IV SOLN
INTRAVENOUS | Status: AC
  Filled 2020-03-16: qty 100

## 2020-03-16 MED ORDER — MIDAZOLAM HCL 2 MG/2ML IJ SOLN
INTRAMUSCULAR | Status: AC
  Filled 2020-03-16: qty 2

## 2020-03-16 MED ORDER — MIDAZOLAM HCL 2 MG/2ML IJ SOLN
INTRAMUSCULAR | Status: DC | PRN
  Administered 2020-03-16 (×2): .5 mg via INTRAVENOUS

## 2020-03-16 MED ORDER — FENTANYL CITRATE (PF) 100 MCG/2ML IJ SOLN
INTRAMUSCULAR | Status: AC
  Filled 2020-03-16: qty 2

## 2020-03-16 MED ORDER — ONDANSETRON HCL 4 MG/2ML IJ SOLN
INTRAMUSCULAR | Status: AC
  Filled 2020-03-16: qty 2

## 2020-03-16 MED ORDER — PROPOFOL 10 MG/ML IV BOLUS
INTRAVENOUS | Status: DC | PRN
  Administered 2020-03-16: 50 mg via INTRAVENOUS
  Administered 2020-03-16: 150 mg via INTRAVENOUS

## 2020-03-16 MED ORDER — EPHEDRINE SULFATE-NACL 50-0.9 MG/10ML-% IV SOSY
PREFILLED_SYRINGE | INTRAVENOUS | Status: DC | PRN
  Administered 2020-03-16 (×2): 10 mg via INTRAVENOUS

## 2020-03-16 MED ORDER — IOHEXOL 300 MG/ML  SOLN
INTRAMUSCULAR | Status: DC | PRN
  Administered 2020-03-16: 10 mL via URETHRAL

## 2020-03-16 MED ORDER — PROPOFOL 10 MG/ML IV BOLUS
INTRAVENOUS | Status: AC
  Filled 2020-03-16: qty 20

## 2020-03-16 MED ORDER — ONDANSETRON HCL 4 MG/2ML IJ SOLN
INTRAMUSCULAR | Status: DC | PRN
  Administered 2020-03-16: 4 mg via INTRAVENOUS

## 2020-03-16 MED ORDER — ONDANSETRON HCL 4 MG/2ML IJ SOLN: 4.0000 mg | Freq: Once | INTRAMUSCULAR | Status: DC | PRN

## 2020-03-16 MED ORDER — LIDOCAINE 2% (20 MG/ML) 5 ML SYRINGE
INTRAMUSCULAR | Status: AC
  Filled 2020-03-16: qty 5

## 2020-03-16 MED ORDER — CEFAZOLIN SODIUM-DEXTROSE 2-4 GM/100ML-% IV SOLN
2.0000 g | INTRAVENOUS | Status: AC
  Administered 2020-03-16: 2 g via INTRAVENOUS

## 2020-03-16 MED ORDER — FENTANYL CITRATE (PF) 100 MCG/2ML IJ SOLN: 25.0000 ug | INTRAMUSCULAR | Status: DC | PRN

## 2020-03-16 MED ORDER — OXYCODONE HCL 5 MG/5ML PO SOLN: 5.0000 mg | Freq: Once | ORAL | Status: DC | PRN

## 2020-03-16 MED ORDER — DEXAMETHASONE SODIUM PHOSPHATE 10 MG/ML IJ SOLN
INTRAMUSCULAR | Status: DC | PRN
  Administered 2020-03-16: 4 mg via INTRAVENOUS

## 2020-03-16 MED ORDER — OXYCODONE HCL 5 MG PO TABS: 5.0000 mg | ORAL_TABLET | Freq: Once | ORAL | Status: DC | PRN

## 2020-03-16 MED ORDER — GLYCOPYRROLATE 0.2 MG/ML IJ SOLN
INTRAMUSCULAR | Status: DC | PRN
Start: 2020-03-16 — End: 2020-03-16
  Administered 2020-03-16: .1 mg via INTRAVENOUS

## 2020-03-16 MED ORDER — ACETAMINOPHEN 325 MG PO TABS: 325.0000 mg | ORAL_TABLET | ORAL | Status: DC | PRN

## 2020-03-16 MED ORDER — LIDOCAINE 2% (20 MG/ML) 5 ML SYRINGE
INTRAMUSCULAR | Status: DC | PRN
  Administered 2020-03-16: 40 mg via INTRAVENOUS

## 2020-03-16 MED ORDER — LACTATED RINGERS IV SOLN: INTRAVENOUS | Status: DC

## 2020-03-16 MED ORDER — OXYCODONE HCL 5 MG PO TABS: 5.0000 mg | ORAL_TABLET | Freq: Three times a day (TID) | ORAL | 0 refills | Status: DC | PRN

## 2020-03-16 MED ORDER — SODIUM CHLORIDE 0.9 % IR SOLN
Status: DC | PRN
  Administered 2020-03-16: 3000 mL

## 2020-03-16 MED ORDER — MEPERIDINE HCL 50 MG/ML IJ SOLN: 6.2500 mg | INTRAMUSCULAR | Status: DC | PRN

## 2020-03-16 MED ORDER — EPHEDRINE 5 MG/ML INJ
INTRAVENOUS | Status: AC
  Filled 2020-03-16: qty 10

## 2020-03-16 MED ORDER — GLYCOPYRROLATE PF 0.2 MG/ML IJ SOSY
PREFILLED_SYRINGE | INTRAMUSCULAR | Status: AC
  Filled 2020-03-16: qty 1

## 2020-03-16 MED ORDER — DEXAMETHASONE SODIUM PHOSPHATE 10 MG/ML IJ SOLN
INTRAMUSCULAR | Status: AC
  Filled 2020-03-16: qty 1

## 2020-03-16 SURGICAL SUPPLY — 23 items
BAG URO CATCHER STRL LF (MISCELLANEOUS) ×3 IMPLANT
BASKET LASER NITINOL 1.9FR (BASKET) IMPLANT
BASKET ZERO TIP NITINOL 2.4FR (BASKET) IMPLANT
BSKT STON RTRVL 120 1.9FR (BASKET)
BSKT STON RTRVL ZERO TP 2.4FR (BASKET)
CATH INTERMIT  6FR 70CM (CATHETERS) ×3 IMPLANT
CLOTH BEACON ORANGE TIMEOUT ST (SAFETY) ×3 IMPLANT
EXTRACTOR STONE 1.7FRX115CM (UROLOGICAL SUPPLIES) IMPLANT
FIBER LASER FLEXIVA 365 (UROLOGICAL SUPPLIES) IMPLANT
FIBER LASER TRAC TIP (UROLOGICAL SUPPLIES) ×2 IMPLANT
GLOVE BIO SURGEON STRL SZ7.5 (GLOVE) ×3 IMPLANT
GOWN STRL REUS W/TWL XL LVL3 (GOWN DISPOSABLE) ×3 IMPLANT
GUIDEWIRE ANG ZIPWIRE 038X150 (WIRE) IMPLANT
GUIDEWIRE STR DUAL SENSOR (WIRE) ×3 IMPLANT
KIT TURNOVER KIT A (KITS) IMPLANT
MANIFOLD NEPTUNE II (INSTRUMENTS) ×3 IMPLANT
SHEATH URETERAL 12FRX28CM (UROLOGICAL SUPPLIES) IMPLANT
SHEATH URETERAL 12FRX35CM (MISCELLANEOUS) IMPLANT
STENT URET 6FRX26 CONTOUR (STENTS) ×2 IMPLANT
TRAY CYSTO PACK (CUSTOM PROCEDURE TRAY) ×3 IMPLANT
TUBING CONNECTING 10 (TUBING) ×2 IMPLANT
TUBING CONNECTING 10' (TUBING) ×1
TUBING UROLOGY SET (TUBING) ×3 IMPLANT

## 2020-03-16 NOTE — Transfer of Care (Signed)
Immediate Anesthesia Transfer of Care Note  Patient: Matthew Black  Procedure(s) Performed: CYSTOSCOPY RIGHT RETROGRADE PYELOGRAM URETEROSCOPY/HOLMIUM LASER/STENT PLACEMENT (Right Urethra)  Patient Location: PACU  Anesthesia Type:General  Level of Consciousness: drowsy and patient cooperative  Airway & Oxygen Therapy: Patient Spontanous Breathing and Patient connected to face mask oxygen  Post-op Assessment: Report given to RN and Post -op Vital signs reviewed and stable  Post vital signs: Reviewed and stable  Last Vitals:  Vitals Value Taken Time  BP 132/121 03/16/20 1401  Temp    Pulse 64 03/16/20 1403  Resp    SpO2 98 % 03/16/20 1403  Vitals shown include unvalidated device data.  Last Pain:  Vitals:   03/16/20 1123  TempSrc:   PainSc: 0-No pain      Patients Stated Pain Goal: 3 ([id number])  Complications: No apparent anesthesia complications

## 2020-03-16 NOTE — Telephone Encounter (Signed)
Faxed order, last note and demograghics to [facility]

## 2020-03-16 NOTE — Op Note (Signed)
Operative Note  Preoperative diagnosis:  1.  Right ureteral calculus  Postoperative diagnosis: 1.  Right ureteral calculus  Procedure(s): 1.  Cystoscopy with right retrograde pyelogram, right ureteroscopy with laser lithotripsy, ureteral stent placement  Surgeon: Link Snuffer, MD  Assistants: None  Anesthesia: General  Complications: None immediate  EBL: Minimal  Specimens: 1.  None  Drains/Catheters: 1.  6 x 26 double-J ureteral stent  Intraoperative findings: 1.  Normal anterior urethra 2.  Borderline obstructing prostate 3.  Normal bladder mucosa 4.  Retrograde pyelogram revealed hydronephrosis above the level of the stone.  There was no filling defect in the kidney itself that was obvious. 5.  Approximately 7 mm calculus fragmented to tiny fragments.  Indication: 44 year old male with right ureteral calculus desires the above operation.  Description of procedure:  The patient was identified and consent was obtained.  The patient was taken to the operating room and placed in the supine position.  The patient was placed under general anesthesia.  Perioperative antibiotics were administered.  The patient was placed in dorsal lithotomy.  Patient was prepped and draped in a standard sterile fashion and a timeout was performed.  A 21 French rigid cystoscope was advanced into the urethra and into the bladder.  Complete cystoscopy was performed with the findings noted above.  I advanced a sensor wire up the right ureter and into the kidney under fluoroscopic guidance.  I advanced a semirigid ureteroscope alongside the wire up to the stone of interest which was laser fragmented to tiny fragments.  I advanced the scope up to the renal pelvis and no other calculi were seen.  I shot a retrograde pyelogram through the scope with findings noted above.  I then carefully withdrew the scope visualizing the entire ureter upon removal.  There was some edema at the level of the chronically  impacted stone but otherwise no problems.  No evidence of any trauma to the ureter.  No clinically significant ureteral calculi were seen.  I backloaded the wire into a rigid cystoscope and advanced that into the bladder followed by routine placement of a 6 x 26 double-J ureteral stent.  Fluoroscopy confirmed proximal placement and direct visualization confirmed a good coil within the bladder.  I drained the bladder and withdrew the scope.  Patient tolerated the procedure well and was stable postoperative.  Plan: Remove stent in 1 week

## 2020-03-16 NOTE — Anesthesia Procedure Notes (Signed)
Procedure Name: LMA Insertion Date/Time: 03/16/2020 1:13 PM Performed by: Eben Burow, CRNA Pre-anesthesia Checklist: Patient identified, Emergency Drugs available, Suction available, Patient being monitored and Timeout performed Patient Re-evaluated:Patient Re-evaluated prior to induction Oxygen Delivery Method: Circle system utilized Preoxygenation: Pre-oxygenation with 100% oxygen Induction Type: IV induction Ventilation: Mask ventilation without difficulty LMA: LMA inserted LMA Size: 4.0 Number of attempts: 1 Tube secured with: Tape Dental Injury: Teeth and Oropharynx as per pre-operative assessment

## 2020-03-16 NOTE — Anesthesia Postprocedure Evaluation (Signed)
Anesthesia Post Note  Patient: Matthew Black  Procedure(s) Performed: CYSTOSCOPY RIGHT RETROGRADE PYELOGRAM URETEROSCOPY/HOLMIUM LASER/STENT PLACEMENT (Right Urethra)     Patient location during evaluation: PACU Anesthesia Type: General Level of consciousness: awake and alert Pain management: pain level controlled Vital Signs Assessment: post-procedure vital signs reviewed and stable Respiratory status: spontaneous breathing, nonlabored ventilation, respiratory function stable and patient connected to nasal cannula oxygen Cardiovascular status: blood pressure returned to baseline and stable Postop Assessment: no apparent nausea or vomiting Anesthetic complications: no    Last Vitals:  Vitals:   03/16/20 1445 03/16/20 1458  BP:  (!) 128/94  Pulse:  63  Resp: 16 17  Temp: (!) 36.4 C (!) 36.4 C  SpO2:  98%    Last Pain:  Vitals:   03/16/20 1458  TempSrc:   PainSc: 0-No pain                 Kayvon Mo

## 2020-03-16 NOTE — Anesthesia Preprocedure Evaluation (Signed)
Anesthesia Evaluation  Patient identified by MRN, date of birth, ID band Patient awake    Reviewed: Allergy & Precautions, NPO status , Patient's Chart, lab work & pertinent test results  Airway Mallampati: II  TM Distance: >3 FB Neck ROM: Full    Dental   Pulmonary former smoker,    Pulmonary exam normal        Cardiovascular hypertension, Pt. on medications Normal cardiovascular exam     Neuro/Psych CVA    GI/Hepatic   Endo/Other    Renal/GU      Musculoskeletal   Abdominal   Peds  Hematology   Anesthesia Other Findings   Reproductive/Obstetrics                             Anesthesia Physical  Anesthesia Plan  ASA: III  Anesthesia Plan: General   Post-op Pain Management:    Induction: Intravenous  PONV Risk Score and Plan: 2 and Ondansetron and Treatment may vary due to age or medical condition  Airway Management Planned: Oral ETT and LMA  Additional Equipment:   Intra-op Plan:   Post-operative Plan: Post-operative intubation/ventilation  Informed Consent: I have reviewed the patients History and Physical, chart, labs and discussed the procedure including the risks, benefits and alternatives for the proposed anesthesia with the patient or authorized representative who has indicated his/her understanding and acceptance.       Plan Discussed with: Surgeon, CRNA and Anesthesiologist  Anesthesia Plan Comments:         Anesthesia Quick Evaluation

## 2020-03-16 NOTE — Discharge Instructions (Signed)

## 2020-03-16 NOTE — H&P (Signed)
CC/HPI: CC: Right ureteral calculus  HPI:  44 year old male recently underwent a CT scan of the abdomen and pelvis for abdominal pain. He was found to have a 7 mm proximal ureteral calculus with moderate right hydroureteronephrosis. There were additional small right renal calculi. He has never had a stone before. He has had a stroke in the past and has had a craniotomy for this. 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 He is not on blood thinners. He has chronic migraines and is having 1 today.   02/21/20: Matthew Black is a 44 year old with a hx of CVA, PE, HTN, DVT, Seizures and craniotomy. He was last seen in the office on 12/13/19 for a right proximal obstructing ureteral stone with hydro. It was recommended he undergo a right URS with litho but the patient refused. Today, he presents for stone follow up and to get clearance for another surgical procedure for a "bone flap removed." PEr his caretaker, they will not do his surgery until he gets his obstructing stone taken care of. He denies flank pain, fevers, inability to void and gross hematuria. He was unable to urinate today in the office and refused a in and out catheter.     ALLERGIES: Hydrocodone - Nausea    MEDICATIONS: Metoprolol Tartrate 50 mg tablet  Baclofen 10 mg tablet  Famotidine 20 mg tablet  Tylenol     GU PSH: None     PSH Notes: Craniectomy 2020  IVC Filter 2020  cyst removed from neck 2017   NON-GU PSH: None   GU PMH: Ureteral calculus - 12/13/2019 Ureteral obstruction secondary to calculous - 12/13/2019    NON-GU PMH: Depression DVT, History Hypertension Pulmonary Embolism, History Seizure disorder Stroke/TIA    FAMILY HISTORY: Diabetes - Runs in Family Hypertension - Mother   SOCIAL HISTORY: Marital Status: Married Race: Black or African American Current Smoking Status: Patient has never smoked.   Tobacco Use Assessment Completed: Used Tobacco in last 30 days? Does not drink anymore.  Does not  drink caffeine. 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    REVIEW OF SYSTEMS:    GU Review Male:   Patient denies frequent urination, hard to postpone urination, burning/ pain with urination, get up at night to urinate, leakage of urine, stream starts and stops, trouble starting your stream, have to strain to urinate , erection problems, and penile pain.  Gastrointestinal (Upper):   Patient denies nausea, vomiting, and indigestion/ heartburn.  Gastrointestinal (Lower):   Patient denies diarrhea and constipation.  Constitutional:   Patient denies fever, night sweats, weight loss, and fatigue.  Skin:   Patient denies skin rash/ lesion and itching.  Eyes:   Patient denies blurred vision and double vision.  Ears/ Nose/ Throat:   Patient denies sore throat and sinus problems.  Hematologic/Lymphatic:   Patient denies swollen glands and easy bruising.  Cardiovascular:   Patient denies leg swelling and chest pains.  Respiratory:   Patient denies cough and shortness of breath.  Endocrine:   Patient denies excessive thirst.  Musculoskeletal:   Patient reports back pain. Patient denies joint pain.  Neurological:   Patient denies headaches and dizziness.  Psychologic:   Patient denies depression and anxiety.   VITAL SIGNS:      02/21/2020 02:06 PM  Weight 190 lb / 86.18 kg  Height 72 in / 182.88 cm  BP 112/76 mmHg  Pulse 67 /min  Temperature 98.0 F / 36.6 C  BMI 25.8 kg/m  MULTI-SYSTEM PHYSICAL EXAMINATION:    Constitutional: Thin. Moderate physical deformities. Normally developed. Good grooming.   Respiratory: No labored breathing, no use of accessory muscles.   Cardiovascular: Normal temperature, normal extremity pulses, no swelling, no varicosities.   Neurologic / Psychiatric: Withdrawn.   Gastrointestinal: No mass, no tenderness, no rigidity, non obese abdomen.   Musculoskeletal: In wheelchair for mobility     PAST DATA REVIEWED:  Source Of History:  Patient, Medical Record  Summary  Records Review:   Previous Doctor Records, Previous Patient Records   PROCEDURES:         Renal Ultrasound (Limited) [id number]  Kidney:RIGHT Length: 11.6 cm Depth:6.2 cm Cortical Width: 1.8 cm Width: 5.8 cm    Right Kidney/Ureter:  Moderate hydro is noted as well as 4 probable stones w largest in the lower pole 7.54mm as well as 4.65mm. There are 2 mid pole calcs 3.5 mm and 5.8 mm.  Bladder:  Volume 211.6 ml w both jets seen      . Patient confirmed No Neulasta OnPro Device.            KUB - [id number]  A single view of the abdomen is obtained.      . Patient confirmed No Neulasta OnPro Device.     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 I requested that he try to go at home and bring back to the office for a UA and culture so we can ensure that he is not going into a procedure with an untreated active infection. Order was placed and a specimen cup was given to his caretaker with the requisition. I was unable to visualize his ureteral stone on KUB due to bowel gas, however his renal US still has moderate hydro. Again, I advised that the best course of action would be a URS with Stent placement. Discussed the risks with the caretaker and the risks of not having anything done. I do not think litho is an option at this point but I will consult his urologist and discuss this further. Strict return precautions advised.     Signed  by Daine Gravel, NP on 02/22/20 at 5:30 PM (EDT

## 2020-03-21 ENCOUNTER — Ambulatory Visit: Payer: Medicaid Other

## 2020-03-21 ENCOUNTER — Encounter: Payer: Medicaid Other | Admitting: Occupational Therapy

## 2020-03-23 DIAGNOSIS — N201 Calculus of ureter: Secondary | ICD-10-CM | POA: Diagnosis not present

## 2020-03-23 DIAGNOSIS — N132 Hydronephrosis with renal and ureteral calculous obstruction: Secondary | ICD-10-CM | POA: Diagnosis not present

## 2020-04-05 ENCOUNTER — Ambulatory Visit: Payer: Medicaid Other | Admitting: Physical Medicine and Rehabilitation

## 2020-04-11 ENCOUNTER — Encounter
Payer: Medicaid Other | Attending: Physical Medicine and Rehabilitation | Admitting: Physical Medicine and Rehabilitation

## 2020-04-11 DIAGNOSIS — Z8673 Personal history of transient ischemic attack (TIA), and cerebral infarction without residual deficits: Secondary | ICD-10-CM | POA: Insufficient documentation

## 2020-04-11 DIAGNOSIS — R531 Weakness: Secondary | ICD-10-CM | POA: Insufficient documentation

## 2020-04-11 DIAGNOSIS — Z87891 Personal history of nicotine dependence: Secondary | ICD-10-CM | POA: Insufficient documentation

## 2020-04-11 DIAGNOSIS — R519 Headache, unspecified: Secondary | ICD-10-CM | POA: Insufficient documentation

## 2020-04-11 DIAGNOSIS — Z79899 Other long term (current) drug therapy: Secondary | ICD-10-CM | POA: Insufficient documentation

## 2020-04-11 DIAGNOSIS — G8918 Other acute postprocedural pain: Secondary | ICD-10-CM | POA: Insufficient documentation

## 2020-04-11 DIAGNOSIS — R4584 Anhedonia: Secondary | ICD-10-CM | POA: Insufficient documentation

## 2020-04-11 DIAGNOSIS — R252 Cramp and spasm: Secondary | ICD-10-CM | POA: Insufficient documentation

## 2020-04-18 DIAGNOSIS — M21372 Foot drop, left foot: Secondary | ICD-10-CM | POA: Diagnosis not present

## 2020-04-25 DIAGNOSIS — M952 Other acquired deformity of head: Secondary | ICD-10-CM | POA: Diagnosis not present

## 2020-05-01 ENCOUNTER — Ambulatory Visit: Payer: Medicaid Other

## 2020-05-04 ENCOUNTER — Other Ambulatory Visit: Payer: Self-pay | Admitting: Neurosurgery

## 2020-05-07 ENCOUNTER — Other Ambulatory Visit: Payer: Self-pay | Admitting: Neurosurgery

## 2020-05-08 ENCOUNTER — Other Ambulatory Visit: Payer: Self-pay

## 2020-05-08 ENCOUNTER — Ambulatory Visit: Payer: Medicaid Other | Attending: Physician Assistant

## 2020-05-08 DIAGNOSIS — M6281 Muscle weakness (generalized): Secondary | ICD-10-CM | POA: Diagnosis not present

## 2020-05-08 DIAGNOSIS — R2689 Other abnormalities of gait and mobility: Secondary | ICD-10-CM | POA: Diagnosis not present

## 2020-05-08 DIAGNOSIS — I69354 Hemiplegia and hemiparesis following cerebral infarction affecting left non-dominant side: Secondary | ICD-10-CM | POA: Diagnosis not present

## 2020-05-09 NOTE — Therapy (Signed)
[facility] [address] [place], [place], [address] Phone: [phone]   Fax:  [phone]  Physical Therapy Treatment  Patient Details  Name: Matthew Black MRN: [id number] Date of Birth: 1976/10/02 Referring Provider (PT): Dr. Naaman Plummer but f/u with Dr. Ranell Patrick   Encounter Date: 05/08/2020   PT End of Session - 05/08/20 1621    Visit Number 11    Number of Visits 16    Date for PT Re-Evaluation 03/19/20    Authorization Type Medicaid 3 visits approved 12/30/19-01/19/20; Medicaid extension approved for 12 PT visits: 02/02/20-03/28/20, 4 visits 6/8 to 8/2    Authorization - Visit Number 1   pt's initial MCD auth for 3 visits met on 01/19/20, new auth for 12 visits (02/02/20-03/28/20   Authorization - Number of Visits 4    PT Start Time 1618    PT Stop Time 1700    PT Time Calculation (min) 42 min    Equipment Utilized During Treatment Gait belt    Activity Tolerance Patient limited by pain    Behavior During Therapy Flat affect           Past Medical History:  Diagnosis Date  . 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 Depression   . Hemorrhagic stroke ([place])   . History of kidney stones   . Hypertension   . 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 Stroke [facility])     Past Surgical History:  Procedure Laterality Date  . CRANIOTOMY Right 07/19/2019   Procedure: RIGHT HEMI-CRANIECTOMY With implantation of skull flap to abdominal wall;  Surgeon: Consuella Lose, MD;  Location: [place];  Service: Neurosurgery;  Laterality: Right;  . CYST EXCISION N/A 10/08/2016   Procedure: EXCISION OF POSTERIOR NECK CYST;  Surgeon: Clovis Riley, MD;  Location: [facility];  Service: General;  Laterality: N/A;  . CYSTOSCOPY/URETEROSCOPY/HOLMIUM LASER/STENT PLACEMENT Right 03/16/2020   Procedure: CYSTOSCOPY RIGHT RETROGRADE PYELOGRAM URETEROSCOPY/HOLMIUM LASER/STENT PLACEMENT;  Surgeon: Lucas Mallow, MD;  Location: [facility];  Service: Urology;  Laterality:  Right;  . INCISION AND DRAINAGE ABSCESS N/A 09/22/2014   Procedure: INCISION AND DRAINAGE ABSCESS POSTERIOR NECK;  Surgeon: Pedro Earls, MD;  Location: [facility];  Service: General;  Laterality: N/A;  . INCISION AND DRAINAGE ABSCESS N/A 12/20/2015   Procedure: INCISION AND DRAINAGE POSTERIOR NECK MASS;  Surgeon: Armandina Gemma, MD;  Location: [facility];  Service: General;  Laterality: N/A;  . INCISION AND DRAINAGE ABSCESS Left 07/10/2004   middle finger  . IR IVC FILTER PLMT / S&I /IMG GUID/MOD SED  07/21/2019  . IR RADIOLOGIST EVAL & MGMT  12/14/2019  . IR VENOGRAM RENAL UNI RIGHT  07/21/2019  . MASS EXCISION N/A 07/21/2017   Procedure: EXCISION OF BENIGN NECK LESION WITH LAYERED CLOSURE;  Surgeon: Irene Limbo, MD;  Location: [place];  Service: Plastics;  Laterality: N/A;  . MASS EXCISION N/A 11/10/2017   Procedure: EXCISION BENIGN LESION OF THE NECK WITH LAYERED CLOSURE;  Surgeon: Irene Limbo, MD;  Location: [place];  Service: Plastics;  Laterality: N/A;    There were no vitals filed for this visit.   Subjective Assessment - 05/08/20 1621    Subjective Pt reports that his new brace is bothering his leg some in calf and ankle. He also reports that his wife is having some trouble with donning it. Has had brace a couple weeks. Has done some standing at home and a little walking at counter.  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 Yes    Pain Score 6     Pain Location Calf    Pain Orientation Left    Pain Descriptors / Indicators Sore    Pain Type Acute pain    Pain Onset More than a month ago                             [facility] Adult PT Treatment/Exercise - 05/08/20 1623      Transfers   Transfers Sit to Stand;Stand to Sit    Sit to Stand 4: Min guard;4: Min assist    Sit to Stand Details Verbal cues for technique    Stand to Sit 4: Min guard      Ambulation/Gait   Ambulation/Gait Yes    Ambulation/Gait Assistance Details In // bars pt was cued to shift  over left leg with PT providing some tactile input at pelvis and left knee. Pt was also cued to shorten left step length and step past left foot with right foot. Same cues with large based quad cane. BP=118/88 after. Pt denied any pain in left leg from brace. Reported he was tired after gait.     Ambulation Distance (Feet) 32 Feet   60' with [facility]   Assistive device Parallel bars;Large base quad cane    Gait Pattern Step-to pattern;Step-through pattern;Decreased step length - right;Decreased step length - left;Decreased stance time - left;Decreased hip/knee flexion - left    Ambulation Surface Level;Indoor    Pre-Gait Activities In // bars: working on facilitating weight shift to right with stepping forward and back with right foot. PT provided tactile cues at pelvis to help weight shift. Also cued to slow down some. Standing at large based quad cane with mirror in front for visual cues repeated stepping forward and back with right leg x 5 with tactile cues at plevis to try to shift over to left more.       Self-Care   Self-Care Other Self-Care Comments    Other Self-Care Comments  PT inspected skin with removing AFO when patient arrived. No redness or irritation noted from brace. Advised pt to continue to monitor and  continue to gradually increase wear time when going to be up. Also to elevate leg when sitting.                  PT Education - 05/09/20 0703    Education Details Proper use of AFO, skin monitoring. Pt to work on weight shifting at counter with AFO.    Person(s) Educated Patient    Methods Explanation    Comprehension Verbalized understanding            PT Short Term Goals - 02/22/20 1551      PT SHORT TERM  GOAL #1   Title Pt will be independent with HEP for strengthening, balance and flexibility with assist of wife to continue gains on own.    Baseline Pt has started initial strengthening HEP with assist of wife for strengthening, weight shifting and flexibility. Continue to progress.    Time 4    Period Weeks    Status On-going    Target Date 02/18/20      PT SHORT TERM GOAL #2   Title Pt will be min assist for bed mobility for improved mobility.    Baseline min assist with bed mobility, still require some assistance with LLE.    Time 4    Period Weeks    Status Achieved    Target Date 02/18/20      PT SHORT TERM GOAL #3   Title Pt will perform transfers CGA for improved mobility.    Baseline CGA/ min assist with stand/pivot and sit to stand transfers.    Time 4    Period Weeks    Status On-going    Target Date 02/18/20      PT SHORT TERM GOAL #4   Title Pt will ambulate 20' with hemiwalker versus quad cane mod assist for improved mobility.    Baseline 69' with large based quad cane mod assist    Time 4    Period Weeks    Status Achieved    Target Date 02/18/20      PT SHORT TERM GOAL #5   Title Pt will have orthotic fitting for left AFO after trialed in clinic    Baseline Pt had orthotist consult today in session and plan to pursue custom plastic AFO. Needs face to face with MD now.    Time 4    Period Weeks    Status On-going    Target Date 02/18/20             PT Long Term Goals - 02/22/20 1552      PT LONG TERM GOAL #1   Title Pt will be  supervision with bed mobility for improved function in home.    Baseline min assist with bed mobility, still require some assistance with LLE.    Time 8    Period Weeks    Status New      PT LONG TERM GOAL #2   Title Pt will be supervision with transfers for improved mobility.    Baseline CGA/min assist    Time 8    Period Weeks    Status New      PT LONG TERM GOAL #3   Title Pt will ambulate >100' with LRAD min assist for improved mobility.    Baseline 63' with LBQC mod assist    Time 8    Period Weeks    Status New      PT LONG TERM GOAL #4   Title Pt will maintain standing x 5 min with minimal UE support supervision for improved balance and ADLs.    Baseline min assist for 1-2 min in // bars.    Time 8    Period Weeks    Status New                 Plan - 05/09/20 1655    Clinical Impression Statement PT assessed gait in new AFO. Pt was reporting some pain upon arrival at home when wearing AFO but did well in session today. No redness noted. Pt needs to continue to work on getting  more left weight shift.    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 08/20/19 w aspiration 08/21/19, (tx empircally w vanco/ cefepime), LLE DVT 07/21/19 and small left lower lung PE 08/01/19 s/p IVC filter 07/21/19, iv heparin discontinued due to worsening ICH 08/14/19, and abdominal wall hematoma, seizure, pseudomonas uti,    Examination-Activity Limitations Bathing;Sit;Bed Mobility;Bend;Squat;Caring for Others;Stairs;Stand;Carry;Toileting;Dressing;Transfers;Hygiene/Grooming;Locomotion Level    Examination-Participation Restrictions Driving;Other;Meal Prep    Rehab Potential Good    PT Frequency 2x / week    PT  Duration 4 weeks    PT Treatment/Interventions ADLs/Self Care Home Management;Aquatic Therapy;Biofeedback;Balance training;Therapeutic exercise;Therapeutic activities;Functional mobility training;Wheelchair mobility training;Orthotic Fit/Training;Stair training;Gait training;DME Instruction;Patient/family education;Cognitive remediation;Neuromuscular re-education;Vestibular;Manual techniques;Passive range of motion;Cryotherapy;Moist Heat    PT Next Visit Plan Continue with gait training with new AFO.           Patient will benefit from skilled therapeutic intervention in order to improve the following deficits and impairments:  Abnormal gait, Difficulty walking, Decreased mobility, Decreased balance, Decreased cognition, Decreased range of motion, Decreased coordination, Decreased safety awareness, Impaired flexibility, Postural dysfunction, Decreased knowledge of use of DME, Decreased strength, Impaired UE functional use, Pain, Impaired tone, Impaired sensation, Decreased endurance  Visit Diagnosis: Other abnormalities of gait and mobility  Muscle weakness (generalized)     Problem List Patient Active Problem List   Diagnosis Date Noted  . Peri-rectal abscess 02/14/2020  . Abnormal CT scan, pelvis 02/14/2020  . Pancytopenia ([place]) 12/02/2019  . Nephrolithiasis 12/02/2019  . Hydronephrosis with renal and ureteral calculus obstruction 12/02/2019  . Rectal pain 12/02/2019  . Hyperkalemia 12/02/2019  . Reactive depression   . Wound infection after surgery   . Sleep disturbance   . Dysphagia, post-stroke   . Transaminitis   . 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 Cerebral abscess   . Urinary tract infection without hematuria   . Altered mental status   . Primary hypercoagulable state ([place])   . Acute pulmonary embolism without acute cor pulmonale (H[facility])   . Deep vein thrombosis (DVT) of non-extremity vein   . Hypokalemia   . Acute blood loss anemia   .  Leukocytosis   . Endotracheal tube present   . Acute respiratory failure with hypoxemia ([place])   . Stroke (cerebrum) ([place]) 07/19/2019  . Pressure injury of skin 07/19/2019  . Acute CVA (cerebrovascular accident) ([place])   . Encephalopathy   . Dysphagia   . Acute encephalopathy   . Essential hypertension   . Obesity 03/12/2016  . Scalp abscess 12/20/2015  . Neck abscess 12/20/2015  . Pilonidal cyst 02/08/2013    Electa Sniff, PT, DPT, NCS 05/09/2020, 7:08 AM  [facility] [address] [place], [place], [address] Phone: [phone]   Fax:  [phone]  Name: CORDARRELL SANE MRN: [id number] Date of Birth: 25-Feb-1976

## 2020-05-15 IMAGING — CT CT HEAD W/O CM
3 series · 16 of 37 positions shown, 18 images · non-contrast
Comparison: Head CT 08/16/2019 at [DATE] a.m.

CLINICAL DATA: Intracranial hemorrhage, known, follow-up.

EXAM:
CT HEAD WITHOUT CONTRAST
TECHNIQUE: Contiguous axial images were obtained from the base of the skull
through the vertex without intravenous contrast.

[Series 3: head wo · axial · 0.51mm/px · z∈[+869,+1014]mm · 7 of 39 slices shown, 9 images]
[im 5/39  brain]
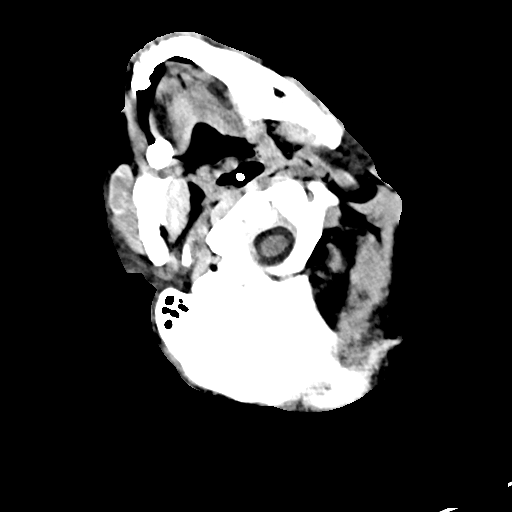
[im 5/39  bone]
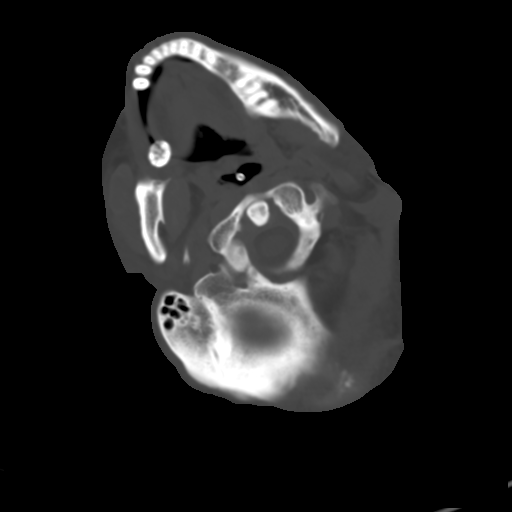
[im 10/39  brain]
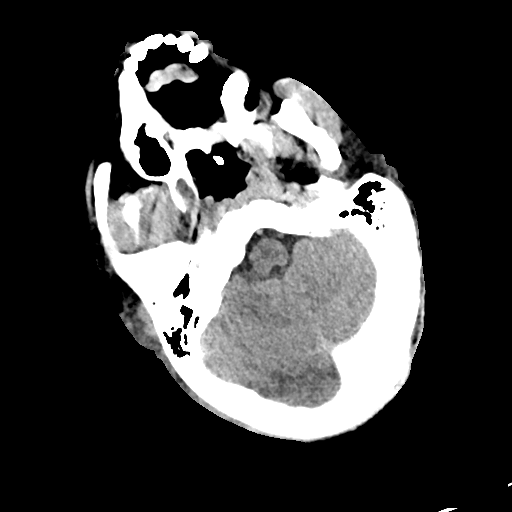
[im 15/39  brain]
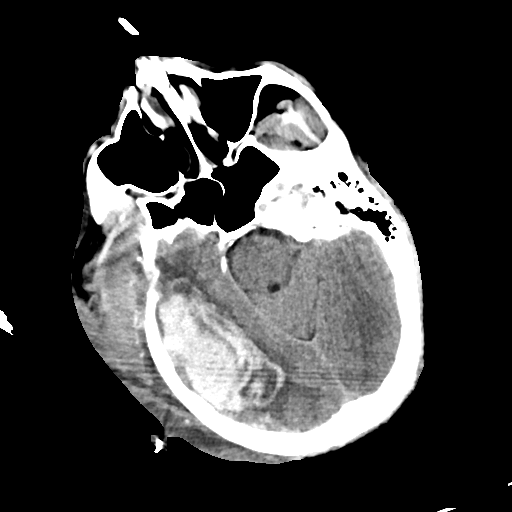
[im 20/39  brain]
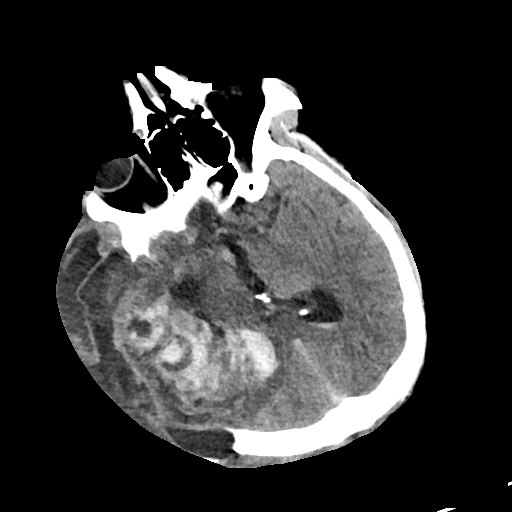
[im 24/39  brain]
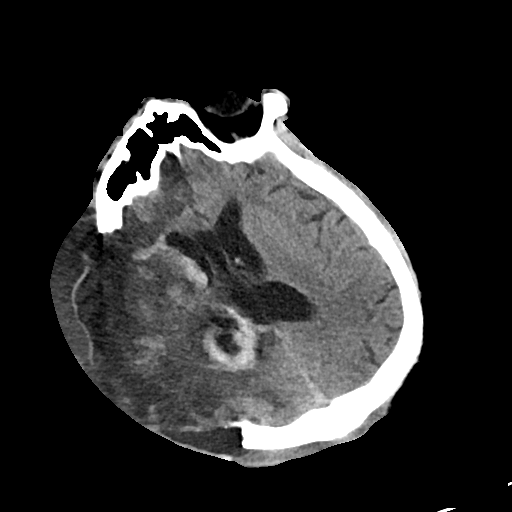
[im 24/39  bone]
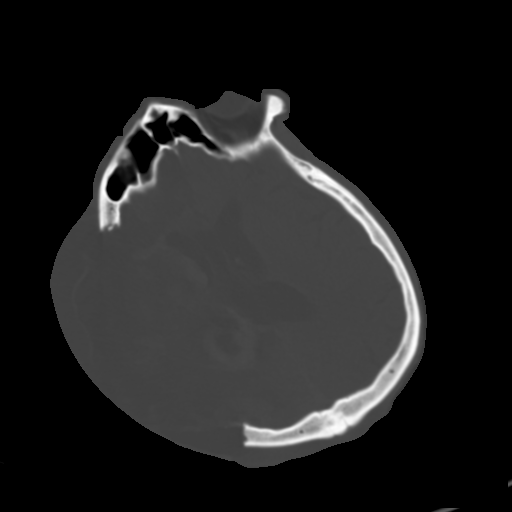
[im 29/39  brain]
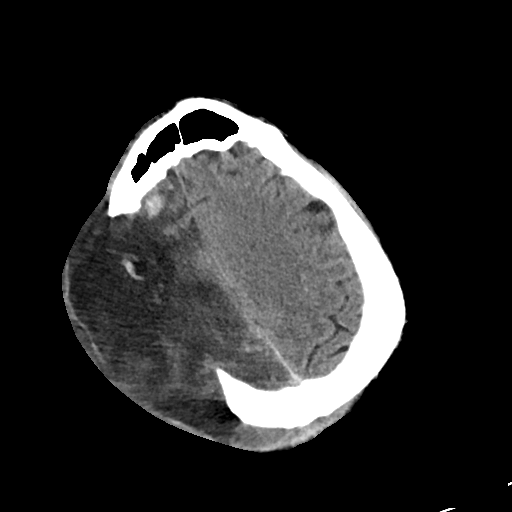
[im 34/39  brain]
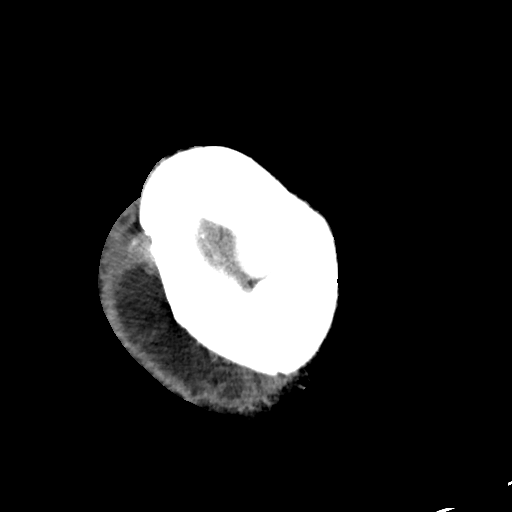

[Series 4: head bone · axial · 0.51mm/px · z∈[+867,+981]mm · 6 of 96 slices shown]
[im 10/96  bone]
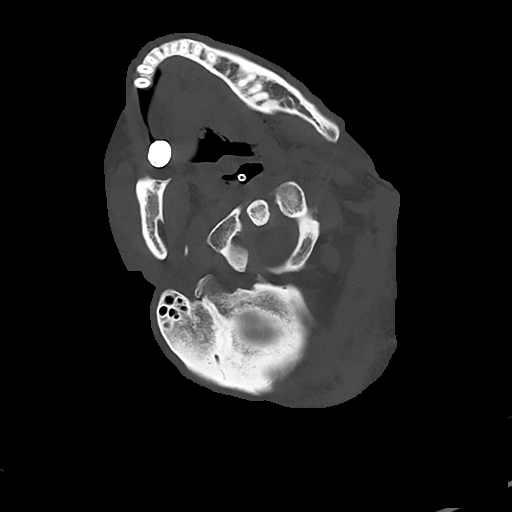
[im 20/96  bone]
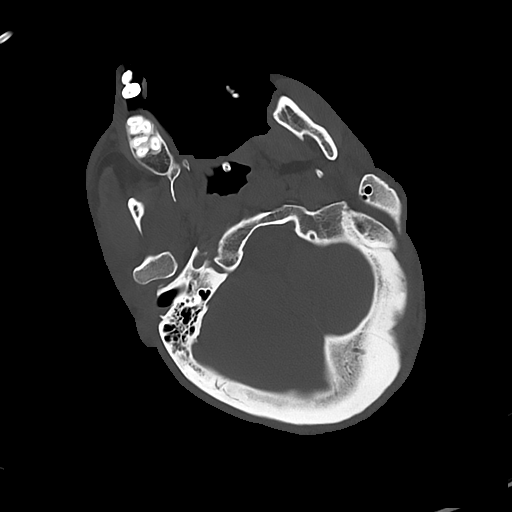
[im 29/96  bone]
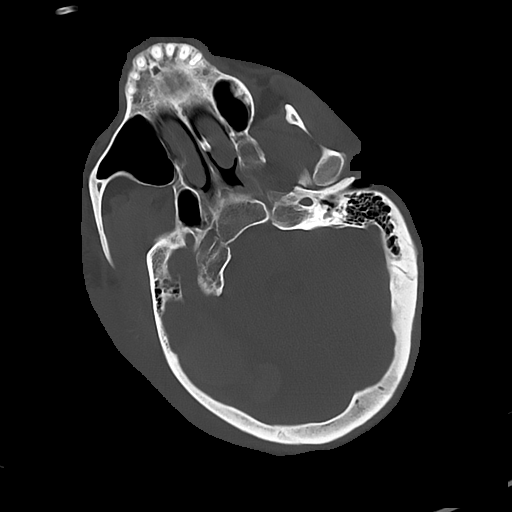
[im 43/96  bone]
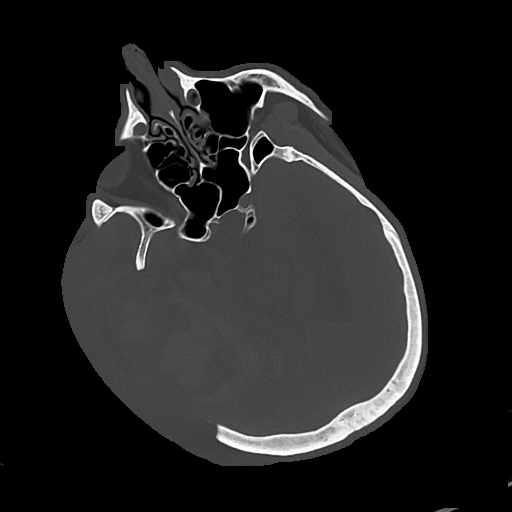
[im 53/96  bone]
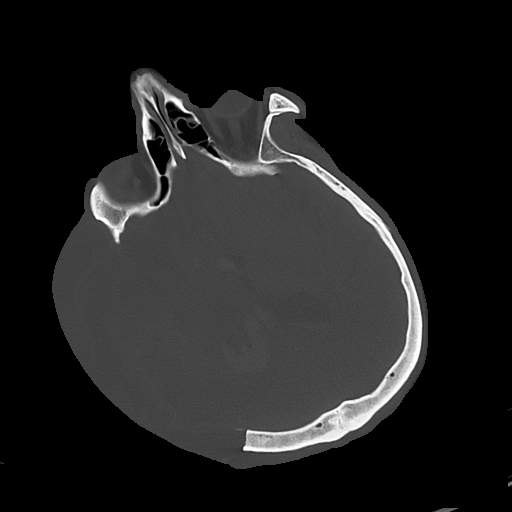
[im 67/96  bone]
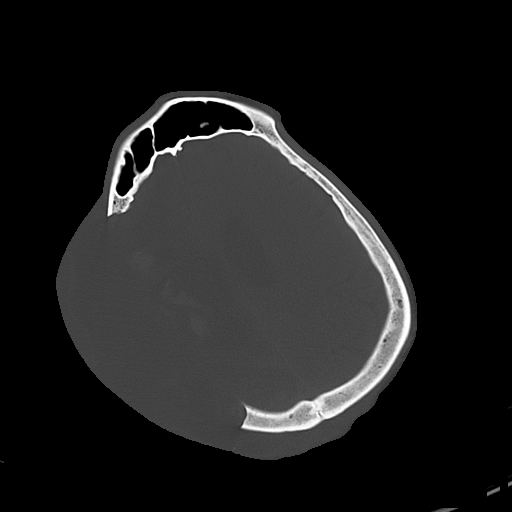

[Series 6: sag soft · sagittal · 0.47mm/px · 3 of 69 slices shown]
[im 28/69  brain]
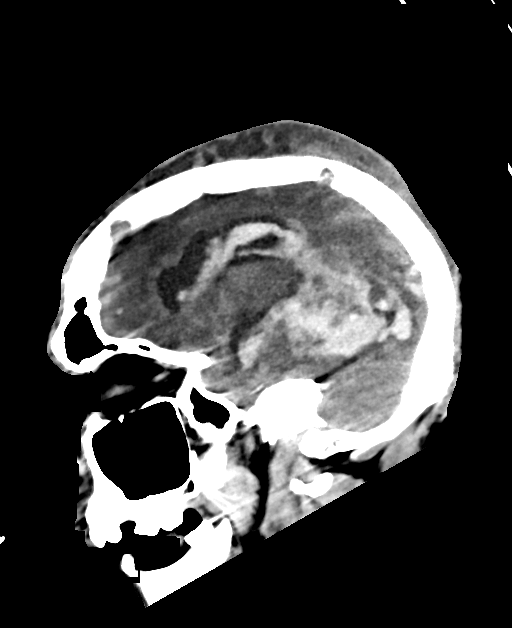
[im 35/69  brain]
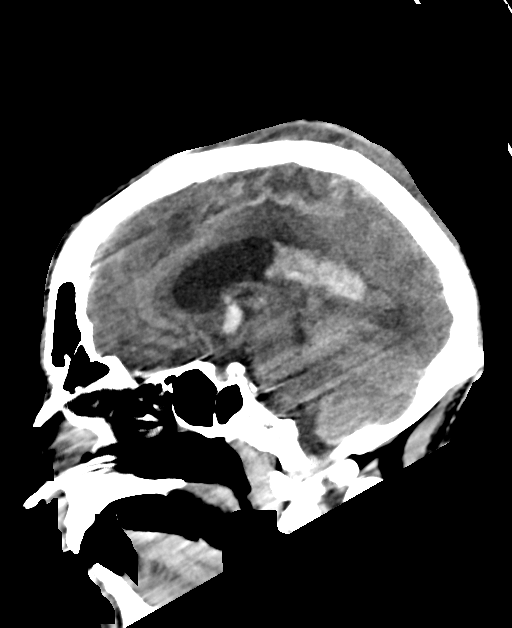
[im 42/69  brain]
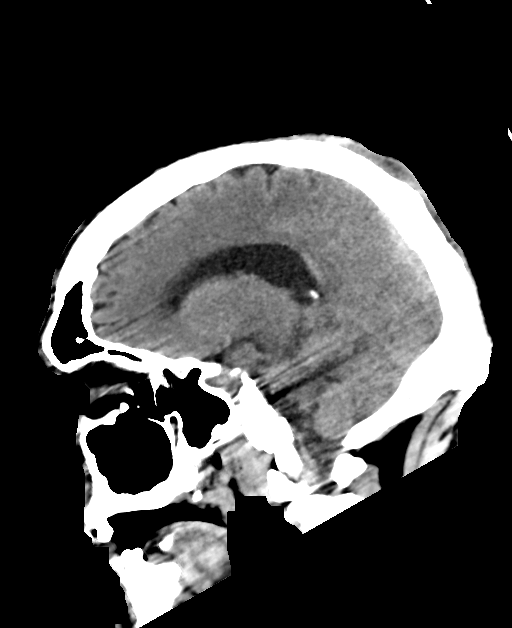

[16 of 37 positions shown; findings below may reference images not displayed]

FINDINGS: Brain: Again demonstrated, extensive right ACA and MCA territory
infarct with associated swelling in the right cerebral hemisphere
and marked bulging through the craniectomy defect. Extensive
parenchymal hemorrhage throughout much of the right MCA territory,
similar to prior exam. Also similar to prior exam, there is moderate
intraventricular hemorrhage although with some interval
redistribution. Lateral ventriculomegaly appears somewhat increased
from prior examination. The patchy distribution of hemorrhage makes
it difficult to provide measurements. The hemorrhage is multifocal
with pockets of fluid levels. 7 mm rightward midline shift measured
at the level of the septum pellucidum related to right sided
external herniation, unchanged. No new demarcated infarction
identified.

Vascular: No change.

Skull: Right-sided craniectomy.

Sinuses/Orbits: Visualized orbits demonstrate no acute abnormality.
No significant paranasal sinus disease. No significant mastoid
effusion. Partially visualized support tubes.

Impression #1 below will be called to the ordering clinician or
representative by the Radiologist Assistant, and communication
documented in the PACS or zVision Dashboard.
IMPRESSION: 1. Lateral ventriculomegaly appears somewhat increased from prior
examination. Consider short interval CT follow-up.
2. Hemorrhagic right ACA and MCA territory infarcts with right
cerebral swelling and external herniation through right-sided
craniectomy defect, similar to prior exam. Intraventricular
hemorrhage has not significantly changed in amount, although with
some interval redistribution.
3. Unchanged 7 mm rightward midline shift related to right-sided
external herniation.

## 2020-05-16 ENCOUNTER — Ambulatory Visit: Payer: Medicaid Other

## 2020-05-16 ENCOUNTER — Other Ambulatory Visit: Payer: Self-pay

## 2020-05-16 DIAGNOSIS — M6281 Muscle weakness (generalized): Secondary | ICD-10-CM

## 2020-05-16 DIAGNOSIS — R2689 Other abnormalities of gait and mobility: Secondary | ICD-10-CM | POA: Diagnosis not present

## 2020-05-16 DIAGNOSIS — I69354 Hemiplegia and hemiparesis following cerebral infarction affecting left non-dominant side: Secondary | ICD-10-CM | POA: Diagnosis not present

## 2020-05-17 NOTE — Therapy (Signed)
[facility] [address] [place], [place], [address] Phone: [phone]   Fax:  [phone]  Physical Therapy Treatment  Patient Details  Name: Matthew Black MRN: [id number] Date of Birth: [birth year] Referring Provider (PT): Dr. Naaman Plummer but f/u with Dr. Ranell Patrick   Encounter Date: 05/16/2020   PT End of Session - 05/16/20 1624    Visit Number 12    Number of Visits 16    Date for PT Re-Evaluation 03/19/20    Authorization Type Medicaid 3 visits approved 12/30/19-01/19/20; Medicaid extension approved for 12 PT visits: 02/02/20-03/28/20, 4 visits 6/8 to 8/2    Authorization - Visit Number 2   pt's initial MCD auth for 3 visits met on 01/19/20, new auth for 12 visits (02/02/20-03/28/20   Authorization - Number of Visits 4    PT Start Time 1620    PT Stop Time 1702    PT Time Calculation (min) 42 min    Equipment Utilized During Treatment Gait belt    Activity Tolerance Patient limited by pain    Behavior During Therapy Flat affect           Past Medical History:  Diagnosis Date  . 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 Depression   . Hemorrhagic stroke ([place])   . History of kidney stones   . Hypertension   . Paralysis ([place])    LEFT SIDE  . Stroke [facility])     Past Surgical History:  Procedure Laterality Date  . CRANIOTOMY Right 07/19/2019   Procedure: RIGHT HEMI-CRANIECTOMY With implantation of skull flap to abdominal wall;  Surgeon: Consuella Lose, MD;  Location: [facility];  Service: Neurosurgery;  Laterality: Right;  . CYST EXCISION N/A 10/08/2016   Procedure: EXCISION OF POSTERIOR NECK CYST;  Surgeon: Clovis Riley, MD;  Location[facility];  Service: General;  Laterality: N/A;  . CYSTOSCOPY/URETEROSCOPY/HOLMIUM LASER/STENT PLACEMENT Right 03/16/2020   Procedure: CYSTOSCOPY RIGHT RETROGRADE PYELOGRAM URETEROSCOPY/HOLMIUM LASER/STENT PLACEMENT;  Surgeon: Lucas Mallow, MD;  [facility];  Service: Urology;  Laterality:  Right;  . INCISION AND DRAINAGE ABSCESS N/A 09/22/2014   Procedure: INCISION AND DRAINAGE ABSCESS POSTERIOR NECK;  Surgeon: Pedro Earls, MD;  [facility];  Service: General;  Laterality: N/A;  . INCISION AND DRAINAGE ABSCESS N/A 12/20/2015   Procedure: INCISION AND DRAINAGE POSTERIOR NECK MASS;  Surgeon: Armandina Gemma, MD;  [facility];  Service: General;  Laterality: N/A;  . INCISION AND DRAINAGE ABSCESS Left 07/10/2004   middle finger  . IR IVC FILTER PLMT / S&I /IMG GUID/MOD SED  07/21/2019  . IR RADIOLOGIST EVAL & MGMT  12/14/2019  . IR VENOGRAM RENAL UNI RIGHT  07/21/2019  . MASS EXCISION N/A 07/21/2017   Procedure: EXCISION OF BENIGN NECK LESION WITH LAYERED CLOSURE;  Surgeon: Irene Limbo, MD;  Location: [facility];  Service: Plastics;  Laterality: N/A;  . MASS EXCISION N/A 11/10/2017   Procedure: EXCISION BENIGN LESION OF THE NECK WITH LAYERED CLOSURE;  Surgeon: Irene Limbo, MD;  Location: [facility];  Service: Plastics;  Laterality: N/A;    There were no vitals filed for this visit.   Subjective Assessment - 05/16/20 1623    Subjective Pt reports that he woke up this morning and back was really hurting him. Thinks it may be his mattress. He did take a tramadol.    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 Yes    Pain Score 9     Pain Location Back    Pain Orientation Lower    Pain Descriptors / Indicators Sharp    Pain Type Acute pain    Pain Onset More than a month ago    Pain Frequency Constant                             [facility] Adult PT Treatment/Exercise - 05/16/20 1630      Transfers   Transfers Sit to Stand;Stand to [employer] Transfers    Sit to Stand 4: Min guard    Sit to Stand Details Verbal cues for technique    Stand to Sit 4: Min guard    Stand Pivot Transfers 4: Min guard    Stand Pivot Transfer Details (indicate cue type and reason) w/c to mat    Comments BP=118/84. Checked after sit to stands as pt reporting headache and holding head. Gets headaches frequently      Ambulation/Gait   Ambulation/Gait Yes    Ambulation/Gait Assistance 4: Min assist    Ambulation/Gait Assistance Details Pt was cued to step slightly part left foot with right foot. Also to try to tighten left quad and glut during stance. PT at times blocked left knee or provided tactile cues to assist. Pt did have some clonus in LLE at times with left knee flexed during stance. PT also assisted to try to weight shift more over left leg.    Ambulation Distance (Feet) 30 Feet   72'   Assistive device Large base quad cane   left AFO, w/c follow 2nd bout   Gait Pattern Step-to pattern;Decreased step length - right;Decreased step length - left;Decreased stance time - left;Decreased hip/knee flexion - left;Decreased weight shift to left;Left flexed knee in stance    Ambulation Surface Level;Indoor    Gait Comments Pain 4/10 in back at end      Neuro Re-ed    Neuro Re-ed Details  Weight shifting in standing with quad cane with PT assisting at pelvis to try to decrease left posterior pelvic rotation and tactile cues at left knee to tighten quad during shift. Performed x 10.  Sit to stands from mat with 2" step under right foot to try to facilitate more right weight shift x  6. PT also assisting to weight shift over left and blocking left knee.                    PT Short Term Goals - 02/22/20 1551      PT SHORT TERM GOAL #1   Title Pt will be independent with HEP for strengthening, balance and flexibility with assist of wife to continue gains on own.    Baseline Pt has started initial strengthening HEP with assist of wife for strengthening, weight shifting and flexibility. Continue  to progress.    Time 4    Period Weeks    Status On-going    Target Date 02/18/20      PT SHORT TERM GOAL #2   Title Pt will be min assist for bed mobility for improved mobility.    Baseline min assist with bed mobility, still require some assistance with LLE.    Time 4    Period Weeks    Status Achieved    Target Date 02/18/20      PT SHORT TERM GOAL #3   Title Pt will perform transfers CGA for improved mobility.    Baseline CGA/ min assist with stand/pivot and sit to stand transfers.    Time 4    Period Weeks    Status On-going    Target Date 02/18/20      PT SHORT TERM GOAL #4   Title Pt will ambulate 20' with hemiwalker versus quad cane mod assist for improved mobility.    Baseline 88' with large based quad cane mod assist    Time 4    Period Weeks    Status Achieved    Target Date 02/18/20      PT SHORT TERM GOAL #5   Title Pt will have orthotic fitting for left AFO after trialed in clinic    Baseline Pt had orthotist consult today in session and plan to pursue custom plastic AFO. Needs face to face with MD now.    Time 4    Period Weeks    Status On-going    Target Date 02/18/20             PT Long Term Goals - 02/22/20 1552      PT LONG TERM GOAL #1   Title Pt will be supervision with bed mobility for improved function in home.    Baseline min assist with bed mobility, still require some assistance with LLE.    Time 8    Period Weeks    Status New      PT LONG TERM GOAL #2   Title Pt will be supervision with transfers for improved  mobility.    Baseline CGA/min assist    Time 8    Period Weeks    Status New      PT LONG TERM GOAL #3   Title Pt will ambulate >100' with LRAD min assist for improved mobility.    Baseline 33' with LBQC mod assist    Time 8    Period Weeks    Status New      PT LONG TERM GOAL #4   Title Pt will maintain standing x 5 min with minimal UE support supervision for improved balance and ADLs.    Baseline min assist for 1-2 min in // bars.    Time 8    Period Weeks    Status New                 Plan - 05/17/20 6578    Clinical Impression Statement Pt was reporting increased back pain upon arrival but did decrease throughout session. Pt was able to increase gait distance today. Continue to focus on trying to improve left weight shift with gait.    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 08/20/19 w aspiration 08/21/19, (tx empircally w vanco/ cefepime), LLE DVT 07/21/19 and small left lower lung PE 08/01/19 s/p IVC filter 07/21/19, iv heparin discontinued due to worsening ICH 08/14/19, and abdominal wall hematoma, seizure, pseudomonas uti,    Examination-Activity Limitations Bathing;Sit;Bed Mobility;Bend;Squat;Caring for Others;Stairs;Stand;Carry;Toileting;Dressing;Transfers;Hygiene/Grooming;Locomotion Level    Examination-Participation Restrictions Driving;Other;Meal Prep    Rehab Potential Good    PT Frequency 2x / week    PT Duration 4 weeks    PT Treatment/Interventions ADLs/Self Care Home Management;Aquatic Therapy;Biofeedback;Balance training;Therapeutic exercise;Therapeutic activities;Functional mobility training;Wheelchair mobility training;Orthotic Fit/Training;Stair training;Gait training;DME  Instruction;Patient/family education;Cognitive remediation;Neuromuscular re-education;Vestibular;Manual techniques;Passive range of motion;Cryotherapy;Moist Heat    PT Next Visit Plan Continue with gait training with new AFO. Try to encourage more left weight shift.           Patient will benefit from skilled therapeutic intervention in order to improve the following deficits and impairments:  Abnormal gait, Difficulty walking, Decreased mobility, Decreased balance, Decreased cognition, Decreased range of motion, Decreased coordination, Decreased safety awareness, Impaired flexibility, Postural dysfunction, Decreased knowledge of use of DME, Decreased strength, Impaired UE functional use, Pain, Impaired tone, Impaired sensation, Decreased endurance  Visit Diagnosis: Other abnormalities of gait and mobility  Muscle weakness (generalized)  Hemiplegia and hemiparesis following cerebral infarction affecting left non-dominant side [facility])     Problem List Patient Active Problem List   Diagnosis Date Noted  . Peri-rectal abscess 02/14/2020  . Abnormal CT scan, pelvis 02/14/2020  . Pancytopenia ([place]) 12/02/2019  . Nephrolithiasis 12/02/2019  . Hydronephrosis with renal and ureteral calculus obstruction 12/02/2019  . Rectal pain 12/02/2019  . Hyperkalemia 12/02/2019  . Reactive depression   . Wound infection after surgery   . Sleep disturbance   . Dysphagia, post-stroke   . Transaminitis   . 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 Cerebral abscess   . Urinary tract infection without hematuria   . Altered mental status   . Primary hypercoagulable state ([place])   . Acute pulmonary embolism without acute cor pulmonale (HCC)   . Deep vein thrombosis (DVT) of non-extremity vein   . Hypokalemia   . Acute blood loss anemia   . Leukocytosis   . Endotracheal tube present   . Acute respiratory failure with hypoxemia ([place])   . Stroke (cerebrum) ([place]) 07/19/2019  . Pressure  injury of skin 07/19/2019  . Acute CVA (cerebrovascular accident) ([place])   . Encephalopathy   . Dysphagia   . Acute encephalopathy   . Essential hypertension   . Obesity 03/12/2016  . Scalp abscess 12/20/2015  . Neck abscess 12/20/2015  . Pilonidal cyst 02/08/2013    Electa Sniff, PT, DPT, NCS 05/17/2020, 6:57 AM  [facility] [address] [place], [place], [address] Phone: [phone]   Fax:  [phone]  Name: JUJUAN DUGO MRN: [id number] Date of Birth: 11/06/1976

## 2020-05-18 ENCOUNTER — Other Ambulatory Visit: Payer: Self-pay | Admitting: Diagnostic Radiology

## 2020-05-18 DIAGNOSIS — I639 Cerebral infarction, unspecified: Secondary | ICD-10-CM

## 2020-05-18 DIAGNOSIS — I824Y9 Acute embolism and thrombosis of unspecified deep veins of unspecified proximal lower extremity: Secondary | ICD-10-CM

## 2020-05-22 ENCOUNTER — Ambulatory Visit: Payer: Medicaid Other

## 2020-05-22 ENCOUNTER — Other Ambulatory Visit: Payer: Self-pay

## 2020-05-22 DIAGNOSIS — I69354 Hemiplegia and hemiparesis following cerebral infarction affecting left non-dominant side: Secondary | ICD-10-CM | POA: Diagnosis not present

## 2020-05-22 DIAGNOSIS — R2689 Other abnormalities of gait and mobility: Secondary | ICD-10-CM | POA: Diagnosis not present

## 2020-05-22 DIAGNOSIS — M6281 Muscle weakness (generalized): Secondary | ICD-10-CM | POA: Diagnosis not present

## 2020-05-22 NOTE — Therapy (Signed)
[facility] [address] [place], [place], [address] Phone: [phone]   Fax:  [phone]  Physical Therapy Treatment  Patient Details  Name: Matthew Black MRN: [id number] Date of Birth: 12-07-1975 Referring Provider (PT): Dr. Naaman Plummer but f/u with Dr. Ranell Patrick   Encounter Date: 05/22/2020   PT End of Session - 05/22/20 1534    Visit Number 13    Number of Visits 16    Date for PT Re-Evaluation 03/19/20    Authorization Type Medicaid 3 visits approved 12/30/19-01/19/20; Medicaid extension approved for 12 PT visits: 02/02/20-03/28/20, 4 visits 6/8 to 8/2    Authorization - Visit Number 3   pt's initial MCD auth for 3 visits met on 01/19/20, new auth for 12 visits (02/02/20-03/28/20   Authorization - Number of Visits 4    PT Start Time 1530    PT Stop Time 1615    PT Time Calculation (min) 45 min    Equipment Utilized During Treatment Gait belt    Activity Tolerance Patient limited by pain    Behavior During Therapy Flat affect           Past Medical History:  Diagnosis Date  . 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 Depression   . Hemorrhagic stroke ([place])   . History of kidney stones   . Hypertension   . 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 Stroke [facility])     Past Surgical History:  Procedure Laterality Date  . CRANIOTOMY Right 07/19/2019   Procedure: RIGHT HEMI-CRANIECTOMY With implantation of skull flap to abdominal wall;  Surgeon: Consuella Lose, MD;  Location: [facility];  Service: Neurosurgery;  Laterality: Right;  . CYST EXCISION N/A 10/08/2016   Procedure: EXCISION OF POSTERIOR NECK CYST;  Surgeon: Clovis Riley, MD;  [facility];  Service: General;  Laterality: N/A;  . CYSTOSCOPY/URETEROSCOPY/HOLMIUM LASER/STENT PLACEMENT Right 03/16/2020   Procedure: CYSTOSCOPY RIGHT RETROGRADE PYELOGRAM URETEROSCOPY/HOLMIUM LASER/STENT PLACEMENT;  Surgeon: Lucas Mallow, MD;  [facility];  Service: Urology;  Laterality:  Right;  . INCISION AND DRAINAGE ABSCESS N/A 09/22/2014   Procedure: INCISION AND DRAINAGE ABSCESS POSTERIOR NECK;  Surgeon: Pedro Earls, MD;  [facility];  Service: General;  Laterality: N/A;  . INCISION AND DRAINAGE ABSCESS N/A 12/20/2015   Procedure: INCISION AND DRAINAGE POSTERIOR NECK MASS;  Surgeon: Armandina Gemma, MD;  [facility];  Service: General;  Laterality: N/A;  . INCISION AND DRAINAGE ABSCESS Left 07/10/2004   middle finger  . IR IVC FILTER PLMT / S&I /IMG GUID/MOD SED  07/21/2019  . IR RADIOLOGIST EVAL & MGMT  12/14/2019  . IR VENOGRAM RENAL UNI RIGHT  07/21/2019  . MASS EXCISION N/A 07/21/2017   Procedure: EXCISION OF BENIGN NECK LESION WITH LAYERED CLOSURE;  Surgeon: Irene Limbo, MD;  Location: [facility];  Service: Plastics;  Laterality: N/A;  . MASS EXCISION N/A 11/10/2017   Procedure: EXCISION BENIGN LESION OF THE NECK WITH LAYERED CLOSURE;  Surgeon: Irene Limbo, MD;  Location: [facility];  Service: Plastics;  Laterality: N/A;    There were no vitals filed for this visit.   Subjective Assessment - 05/22/20 1534    Subjective Pt's surgery was scheduled for 7/13 to have cranioplasty. Pt reports doing well.    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 No/denies    Pain Onset  More than a month ago                             [facility] Adult PT Treatment/Exercise - 05/22/20 1536      Transfers   Transfers Sit to Stand;Stand to Sit;Stand Pivot Transfers    Sit to Stand 4: Min guard    Sit to Stand Details Verbal cues for technique;Manual facilitation for weight shifting    Stand to Sit 4: Min guard    Stand Pivot Transfers 4: Min guard    Stand Pivot Transfer Details (indicate cue type and reason) w/c to mat      Ambulation/Gait   Ambulation/Gait Yes    Ambulation/Gait Assistance 4: Min assist;3: Mod assist    Ambulation/Gait Assistance Details Pt was cued to increase right step just past left foot. Also cued to try to stay more upright including straightening left knee with stance. PT facilitating upright posture with support on chest and also trying to facilitate weight shift over left leg during stance.     Ambulation Distance (Feet) 71 Feet    Assistive device Large base quad cane   left AFO with w/c follow   Gait Pattern Step-to pattern;Decreased step length - right;Decreased step length - left;Decreased stance time - left;Decreased hip/knee flexion - left;Decreased weight shift to left;Left flexed knee in stance    Ambulation Surface Level;Indoor      Neuro Re-ed    Neuro Re-ed Details  Standing in front of mat with mirror for visual cues: weight shifting side to side with PT helping to facilitate weight shift to the left and blocking left knee, stepping forward and back with right leg with PT facilitating weight shift to left as well as trying to decrease left posterior pelvic rotation. Pt lost balance 1x having to sit down on mat to recover.      Exercises   Exercises Other Exercises;Knee/Hip      Knee/Hip Exercises: Aerobic   Other Aerobic SciFit x 5 min level 3 BLE only. PT stabilizing w/c and left leg. HR=70 prior and 72 after.                     PT Short Term Goals - 02/22/20 1551      PT SHORT TERM GOAL #1   Title  Pt will be independent with HEP for strengthening, balance and flexibility with assist of wife to continue gains on own.    Baseline Pt has started initial strengthening HEP with assist of wife for strengthening, weight shifting and flexibility. Continue to progress.    Time 4    Period Weeks    Status On-going    Target Date 02/18/20      PT SHORT TERM GOAL #2   Title Pt will be min assist for bed mobility for improved mobility.    Baseline min assist with bed mobility, still  require some assistance with LLE.    Time 4    Period Weeks    Status Achieved    Target Date 02/18/20      PT SHORT TERM GOAL #3   Title Pt will perform transfers CGA for improved mobility.    Baseline CGA/ min assist with stand/pivot and sit to stand transfers.    Time 4    Period Weeks    Status On-going    Target Date 02/18/20      PT SHORT TERM GOAL #4   Title Pt will ambulate 20' with hemiwalker versus quad cane mod assist for improved mobility.    Baseline 61' with large based quad cane mod assist    Time 4    Period Weeks    Status Achieved    Target Date 02/18/20      PT SHORT TERM GOAL #5   Title Pt will have orthotic fitting for left AFO after trialed in clinic    Baseline Pt had orthotist consult today in session and plan to pursue custom plastic AFO. Needs face to face with MD now.    Time 4    Period Weeks    Status On-going    Target Date 02/18/20             PT Long Term Goals - 02/22/20 1552      PT LONG TERM GOAL #1   Title Pt will be supervision with bed mobility for improved function in home.    Baseline min assist with bed mobility, still require some assistance with LLE.    Time 8    Period Weeks    Status New      PT LONG TERM GOAL #2   Title Pt will be supervision with transfers for improved mobility.    Baseline CGA/min assist    Time 8    Period Weeks    Status New      PT LONG TERM GOAL #3   Title Pt will ambulate >100' with LRAD min assist for improved  mobility.    Baseline 83' with LBQC mod assist    Time 8    Period Weeks    Status New      PT LONG TERM GOAL #4   Title Pt will maintain standing x 5 min with minimal UE support supervision for improved balance and ADLs.    Baseline min assist for 1-2 min in // bars.    Time 8    Period Weeks    Status New                 Plan - 05/22/20 1832    Clinical Impression Statement PT continued to work on increasing left weight shift. Pt has decreased left stance time and pelvic posteriorly rotated with stepping.    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 08/20/19 w aspiration 08/21/19, (tx empircally w vanco/ cefepime), LLE DVT 07/21/19 and small left lower lung PE 08/01/19 s/p IVC filter 07/21/19, iv heparin discontinued due to worsening ICH 08/14/19, and abdominal wall hematoma, seizure, pseudomonas uti,    Examination-Activity Limitations Bathing;Sit;Bed Mobility;Bend;Squat;Caring for Others;Stairs;Stand;Carry;Toileting;Dressing;Transfers;Hygiene/Grooming;Locomotion Level    Examination-Participation Restrictions Driving;Other;Meal Prep    Rehab Potential Good    PT Frequency 2x /  week    PT Duration 4 weeks    PT Treatment/Interventions ADLs/Self Care Home Management;Aquatic Therapy;Biofeedback;Balance training;Therapeutic exercise;Therapeutic activities;Functional mobility training;Wheelchair mobility training;Orthotic Fit/Training;Stair training;Gait training;DME Instruction;Patient/family education;Cognitive remediation;Neuromuscular re-education;Vestibular;Manual techniques;Passive range of motion;Cryotherapy;Moist Heat    PT Next Visit Plan Check goals to try to request more medicaid auth. Should  have 5 left to request although medicaid only approved 4 this last go around. Continue with gait training with new AFO. Try to encourage more left weight shift.           Patient will benefit from skilled therapeutic intervention in order to improve the following deficits and impairments:  Abnormal gait, Difficulty walking, Decreased mobility, Decreased balance, Decreased cognition, Decreased range of motion, Decreased coordination, Decreased safety awareness, Impaired flexibility, Postural dysfunction, Decreased knowledge of use of DME, Decreased strength, Impaired UE functional use, Pain, Impaired tone, Impaired sensation, Decreased endurance  Visit Diagnosis: Other abnormalities of gait and mobility  Muscle weakness (generalized)  Hemiplegia and hemiparesis following cerebral infarction affecting left non-dominant side [facility])     Problem List Patient Active Problem List   Diagnosis Date Noted  . Peri-rectal abscess 02/14/2020  . Abnormal CT scan, pelvis 02/14/2020  . Pancytopenia ([place]) 12/02/2019  . Nephrolithiasis 12/02/2019  . Hydronephrosis with renal and ureteral calculus obstruction 12/02/2019  . Rectal pain 12/02/2019  . Hyperkalemia 12/02/2019  . Reactive depression   . Wound infection after surgery   . Sleep disturbance   . Dysphagia, post-stroke   . Transaminitis   . 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 Cerebral abscess   . Urinary tract infection without hematuria   . Altered mental status   . Primary hypercoagulable state ([place])   . Acute pulmonary embolism without acute cor pulmonale (HCC)   . Deep vein thrombosis (DVT) of non-extremity vein   . Hypokalemia   . Acute blood loss anemia   . Leukocytosis   . Endotracheal tube present   . Acute respiratory failure with hypoxemia ([place])   . Stroke (cerebrum) ([place]) 07/19/2019  . Pressure injury of skin 07/19/2019  . Acute CVA (cerebrovascular accident) ([place])   . Encephalopathy   . Dysphagia     . Acute encephalopathy   . Essential hypertension   . Obesity 03/12/2016  . Scalp abscess 12/20/2015  . Neck abscess 12/20/2015  . Pilonidal cyst 02/08/2013    Electa Sniff, PT, DPT, NCS 05/22/2020, 6:35 PM  [facility] [address] [place], [place], [address] Phone: [phone]   Fax:  [phone]  Name: Matthew Black MRN: [id number] Date of Birth: 01/12/1976

## 2020-05-23 ENCOUNTER — Other Ambulatory Visit: Payer: Self-pay | Admitting: Neurosurgery

## 2020-05-29 ENCOUNTER — Ambulatory Visit: Payer: Medicaid Other | Attending: Physician Assistant

## 2020-05-29 ENCOUNTER — Other Ambulatory Visit: Payer: Self-pay

## 2020-05-29 DIAGNOSIS — M6281 Muscle weakness (generalized): Secondary | ICD-10-CM | POA: Diagnosis not present

## 2020-05-29 DIAGNOSIS — I69354 Hemiplegia and hemiparesis following cerebral infarction affecting left non-dominant side: Secondary | ICD-10-CM | POA: Insufficient documentation

## 2020-05-29 DIAGNOSIS — R2689 Other abnormalities of gait and mobility: Secondary | ICD-10-CM

## 2020-05-29 NOTE — Therapy (Signed)
[facility] [address] [place], [place], [address] Phone: [phone]   Fax:  [phone]  Physical Therapy Treatment/Recert  Patient Details  Name: Matthew Black MRN: [id number] Date of Birth: [birth year] Referring Provider (PT): Dr. Naaman Plummer but f/u with Dr. Ranell Patrick   Encounter Date: 05/29/2020   PT End of Session - 05/29/20 1453    Visit Number 14    Number of Visits 19    Date for PT Re-Evaluation --    Authorization Type Medicaid 3 visits approved 12/30/19-01/19/20; Medicaid extension approved for 12 PT visits: 02/02/20-03/28/20, 4 visits 6/8 to 8/2    Authorization - Visit Number 4   pt's initial MCD auth for 3 visits met on 01/19/20, new auth for 12 visits (02/02/20-03/28/20   Authorization - Number of Visits 4    PT Start Time 1450    PT Stop Time 1530    PT Time Calculation (min) 40 min    Equipment Utilized During Treatment Gait belt    Activity Tolerance Patient tolerated treatment well    Behavior During Therapy WFL for tasks assessed/performed           Past Medical History:  Diagnosis Date  . 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 Depression   . Hemorrhagic stroke ([place])   . History of kidney stones   . Hypertension   . 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 Stroke [facility])     Past Surgical History:  Procedure Laterality Date  . CRANIOTOMY Right 07/19/2019   Procedure: RIGHT HEMI-CRANIECTOMY With implantation of skull flap to abdominal wall;  Surgeon: Consuella Lose, MD;  Location: [facility];  Service: Neurosurgery;  Laterality: Right;  . CYST EXCISION N/A 10/08/2016   Procedure: EXCISION OF POSTERIOR NECK CYST;  Surgeon: Clovis Riley, MD;  Location: [facility];  Service: General;  Laterality: N/A;  . CYSTOSCOPY/URETEROSCOPY/HOLMIUM LASER/STENT PLACEMENT Right 03/16/2020   Procedure: CYSTOSCOPY RIGHT RETROGRADE PYELOGRAM URETEROSCOPY/HOLMIUM LASER/STENT PLACEMENT;  Surgeon: Lucas Mallow, MD;  Location: [facility];   Service: Urology;  Laterality: Right;  . INCISION AND DRAINAGE ABSCESS N/A 09/22/2014   Procedure: INCISION AND DRAINAGE ABSCESS POSTERIOR NECK;  Surgeon: Pedro Earls, MD;  Location: [facility];  Service: General;  Laterality: N/A;  . INCISION AND DRAINAGE ABSCESS N/A 12/20/2015   Procedure: INCISION AND DRAINAGE POSTERIOR NECK MASS;  Surgeon: Armandina Gemma, MD;  Location[facility];  Service: General;  Laterality: N/A;  . INCISION AND DRAINAGE ABSCESS Left 07/10/2004   middle finger  . IR IVC FILTER PLMT / S&I /IMG GUID/MOD SED  07/21/2019  . IR RADIOLOGIST EVAL & MGMT  12/14/2019  . IR VENOGRAM RENAL UNI RIGHT  07/21/2019  . MASS EXCISION N/A 07/21/2017   Procedure: EXCISION OF BENIGN NECK LESION WITH LAYERED CLOSURE;  Surgeon: Irene Limbo, MD;  Location: [facility];  Service: Plastics;  Laterality: N/A;  . MASS EXCISION N/A 11/10/2017   Procedure: EXCISION BENIGN LESION OF THE NECK WITH LAYERED CLOSURE;  Surgeon: Irene Limbo, MD;  Location: [place];  Service: Plastics;  Laterality: N/A;    There were no vitals filed for this visit.   Subjective Assessment - 05/29/20 1453    Subjective Pt reports he is doing well. No isssues with new AFO.    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 No/denies    Pain  Onset More than a month ago              [facility] PT Assessment - 05/29/20 1454      Assessment   Medical Diagnosis R MCA/ACA CVA    Referring Provider (PT) Dr. Naaman Plummer but f/u with Dr. Ranell Patrick    Onset Date/Surgical Date 07/19/19                         [facility] Adult PT Treatment/Exercise - 05/29/20 1454      Bed Mobility   Bed Mobility Supine to Sit;Sit to Supine    Supine to Sit Supervision/Verbal cueing    Sit to Supine Supervision/Verbal cueing      Transfers   Transfers Sit to Stand;Stand to Sit;Stand Pivot Transfers    Sit to Stand 5: Supervision;4: Min guard    Sit to Stand Details Verbal cues for technique    Stand to Sit 4: Min guard    Stand Pivot Transfers 5: Supervision    Stand Pivot Transfer Details (indicate cue type and reason) w/c to mat      Ambulation/Gait   Ambulation/Gait Yes    Ambulation/Gait Assistance 4: Min assist;4: Min guard    Ambulation/Gait Assistance Details PT provided tactile cues at pelvis to try to decreased left pelvic posterior rotation. Verbal cues to try to slow his right step to increase left stance time    Ambulation Distance (Feet) 120 Feet   70'   Assistive device Large base quad cane   Left AFO and w/c follow   Gait Pattern Step-to pattern;Step-through pattern;Decreased hip/knee flexion - left;Left flexed knee in stance    Ambulation Surface Level;Indoor    Gait velocity 70 sec=0.49ms      Neuro Re-ed    Neuro Re-ed Details  Standing at large based quad cane with no UE support at all for most of time 5 min 10 sec then PT had patient stop. PT helped to position left foot in less ER.                  PT Education - 05/29/20 1951    Education Details Discussed recert plan after his surgery next week pending medicaid approval.    Person(s) Educated Patient;Caregiver(s)    Methods Explanation    Comprehension Verbalized understanding            PT Short Term Goals - 05/29/20 1954      PT SHORT TERM  GOAL #1   Title Pt will be independent with HEP for strengthening, balance and flexibility with assist of wife to continue gains on own.    Baseline Pt has been performing HEP with wife at home.    Time 4    Period Weeks    Status Achieved    Target Date 02/18/20      PT SHORT TERM GOAL #2   Title Pt will be min assist for  bed mobility for improved mobility.    Baseline min assist with bed mobility, still require some assistance with LLE.    Time 4    Period Weeks    Status Achieved    Target Date 02/18/20      PT SHORT TERM GOAL #3   Title Pt will perform transfers CGA for improved mobility.    Baseline supervision/CGA with transfers    Time 4    Period Weeks    Status Achieved    Target Date 02/18/20      PT SHORT TERM GOAL #4   Title Pt will ambulate 20' with hemiwalker versus quad cane mod assist for improved mobility.    Baseline 40' with large based quad cane mod assist    Time 4    Period Weeks    Status Achieved    Target Date 02/18/20      PT SHORT TERM GOAL #5   Title Pt will have orthotic fitting for left AFO after trialed in clinic    Baseline Has had orthotic fitting and received left AFO    Time 4    Period Weeks    Status Achieved    Target Date 02/18/20             PT Long Term Goals - 05/29/20 1956      PT LONG TERM GOAL #1   Title Pt will be supervision with bed mobility for improved function in home.    Baseline supervision with sit to/from supine today.    Time 8    Period Weeks    Status Achieved      PT LONG TERM GOAL #2   Title Pt will be supervision with transfers for improved mobility.    Baseline supervision/CGA    Time 8    Period Weeks    Status Partially Met      PT LONG TERM GOAL #3   Title Pt will ambulate >100' with LRAD min assist for improved mobility.    Baseline 120' with large based quad cane min assist.    Time 8    Period Weeks    Status Achieved      PT LONG TERM GOAL #4   Title Pt will maintain standing x 5  min with minimal UE support supervision for improved balance and ADLs.    Baseline >5 min supervision on 05/29/20    Time 8    Period Weeks    Status Achieved          Updated PT goals:  PT Short Term Goals - 05/29/20 2006      PT SHORT TERM GOAL #1   Title STGs=LTGs           PT Long Term Goals - 05/29/20 2006      PT LONG TERM GOAL #1   Title Pt will increase gait speed from 0.68ms to >0.296m for improved gait safety.    Baseline 0.143mon 05/29/20    Time 5    Period Weeks    Status New    Target Date 07/13/20      PT LONG TERM GOAL #2   Title Pt will be supervision with transfers for improved mobility.    Baseline supervision/CGA    Time 5    Period Weeks    Status On-going    Target Date 07/13/20      PT LONG TERM GOAL #3   Title Pt will ambulate >200' with LRAD CGA for  improved mobility.    Baseline 120' with large based quad cane min assist.    Time 5    Period Weeks    Status New    Target Date 07/13/20                 Plan - 05/29/20 1957    Clinical Impression Statement PT reassessed goals today with patient showing significant progress. He met standing goal with standing >5 min supervision with minimal to no UE support. Pt is supervision with bed mobility.  Supervision/CGA with transfers. Was able to increase gait distance to 120' today with large based quad cane with new AFO min assist. Pt continues to show progress with gait since receiving new AFO. Pt needs continued work on improving left weight shift with gait. PT requesting extended visits asking for the 5 visits that were approved prior to going on hold when waiting on AFO that should still be available. Pt will continue to benefit from skilled PT.    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 08/20/19 w aspiration 08/21/19, (tx empircally w vanco/ cefepime), LLE DVT 07/21/19 and small left lower lung PE 08/01/19 s/p IVC filter 07/21/19, iv heparin discontinued due to worsening ICH 08/14/19, and abdominal wall hematoma, seizure, pseudomonas uti,    Examination-Activity Limitations Bathing;Sit;Bed Mobility;Bend;Squat;Caring for Others;Stairs;Stand;Carry;Toileting;Dressing;Transfers;Hygiene/Grooming;Locomotion Level    Examination-Participation Restrictions Driving;Other;Meal Prep    Rehab Potential Good    PT Frequency 1x / week    PT Duration --   5 weeks   PT Treatment/Interventions ADLs/Self Care Home Management;Aquatic Therapy;Biofeedback;Balance training;Therapeutic exercise;Therapeutic activities;Functional mobility training;Wheelchair mobility training;Orthotic Fit/Training;Stair training;Gait training;DME Instruction;Patient/family education;Cognitive remediation;Neuromuscular re-education;Vestibular;Manual techniques;Passive range of motion;Cryotherapy;Moist Heat    PT Next Visit Plan Pt having cranioplasty on 7/13. Will resume after that pending no changes and approval of medicaid auth. How did surgery go? Continue with focus on gait training with new AFO.    Consulted and Agree with Plan of Care Patient           Patient will benefit from skilled therapeutic intervention in order to improve the following deficits and impairments:  Abnormal gait, Difficulty walking, Decreased mobility, Decreased balance, Decreased cognition, Decreased range of motion, Decreased coordination, Decreased safety awareness, Impaired flexibility, Postural dysfunction, Decreased knowledge of use of DME, Decreased strength, Impaired UE functional use, Pain, Impaired tone, Impaired sensation, Decreased endurance  Visit Diagnosis: Other abnormalities of gait and mobility  Muscle weakness (generalized)  Hemiplegia and  hemiparesis following cerebral infarction affecting left non-dominant side [facility])     Problem List Patient Active Problem List   Diagnosis Date Noted  . Peri-rectal abscess 02/14/2020  . Abnormal CT scan, pelvis 02/14/2020  . Pancytopenia ([place]) 12/02/2019  . Nephrolithiasis 12/02/2019  . Hydronephrosis with renal and ureteral calculus obstruction 12/02/2019  . Rectal pain 12/02/2019  . Hyperkalemia 12/02/2019  . Reactive depression   . Wound infection after surgery   . Sleep disturbance   . Dysphagia, post-stroke   . Transaminitis   . 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 Cerebral abscess   . Urinary tract infection without hematuria   . Altered mental status   . Primary hypercoagulable state ([place])   . Acute pulmonary embolism without acute cor pulmonale (H[facility])   . Deep  vein thrombosis (DVT) of non-extremity vein   . Hypokalemia   . Acute blood loss anemia   . Leukocytosis   . Endotracheal tube present   . Acute respiratory failure with hypoxemia ([place])   . Stroke (cerebrum) ([place]) 07/19/2019  . Pressure injury of skin 07/19/2019  . Acute CVA (cerebrovascular accident) ([place])   . Encephalopathy   . Dysphagia   . Acute encephalopathy   . Essential hypertension   . Obesity 03/12/2016  . Scalp abscess 12/20/2015  . Neck abscess 12/20/2015  . Pilonidal cyst 02/08/2013    Electa Sniff, PT, DPT, NCS 05/29/2020, 8:04 PM  [facility] [address] [place], [place], [address] Phone: [phone]   Fax:  [phone]  Name: ABUBAKR WIEMAN MRN: [id number] Date of Birth: 11/01/1976

## 2020-05-31 ENCOUNTER — Encounter: Payer: Self-pay | Admitting: Physical Medicine and Rehabilitation

## 2020-05-31 ENCOUNTER — Telehealth: Payer: Self-pay

## 2020-05-31 DIAGNOSIS — I63511 Cerebral infarction due to unspecified occlusion or stenosis of right middle cerebral artery: Secondary | ICD-10-CM

## 2020-05-31 NOTE — Telephone Encounter (Signed)
Matthew Black has requested a letter stating why he cannot sit for jury duty. The letter must include the reason and his disability diagnosis.   His original date for duty was yesterday, 05/30/2020. However he will the letter ASAP. His wife will pick up the letter.

## 2020-05-31 NOTE — Telephone Encounter (Signed)
I have written communication- it should be sent directly to patient's MyChart!

## 2020-06-01 ENCOUNTER — Telehealth: Payer: Self-pay

## 2020-06-01 ENCOUNTER — Encounter (HOSPITAL_COMMUNITY)
Admission: RE | Admit: 2020-06-01 | Discharge: 2020-06-01 | Disposition: A | Discharge status: Home / Self Care | Payer: Medicaid Other | Source: Ambulatory Visit | Attending: Neurosurgery | Admitting: Neurosurgery

## 2020-06-01 ENCOUNTER — Encounter (HOSPITAL_COMMUNITY): Payer: Self-pay

## 2020-06-01 ENCOUNTER — Other Ambulatory Visit (HOSPITAL_COMMUNITY)
Admission: RE | Admit: 2020-06-01 | Discharge: 2020-06-01 | Disposition: A | Discharge status: Home / Self Care | Payer: Medicaid Other | Source: Ambulatory Visit | Attending: Neurosurgery | Admitting: Neurosurgery

## 2020-06-01 ENCOUNTER — Other Ambulatory Visit: Payer: Self-pay

## 2020-06-01 DIAGNOSIS — Z20822 Contact with and (suspected) exposure to covid-19: Secondary | ICD-10-CM | POA: Diagnosis not present

## 2020-06-01 DIAGNOSIS — Z01812 Encounter for preprocedural laboratory examination: Secondary | ICD-10-CM | POA: Diagnosis not present

## 2020-06-01 HISTORY — DX: Other pulmonary embolism without acute cor pulmonale: I26.99

## 2020-06-01 HISTORY — DX: Acute embolism and thrombosis of unspecified deep veins of unspecified lower extremity: I82.409

## 2020-06-01 LAB — BASIC METABOLIC PANEL
Anion gap: 10 (ref 5–15)
BUN: 10 mg/dL (ref 6–20)
CO2: 21 mmol/L — ABNORMAL LOW (ref 22–32)
Calcium: 9.3 mg/dL (ref 8.9–10.3)
Chloride: 110 mmol/L (ref 98–111)
Creatinine, Ser: 0.95 mg/dL (ref 0.61–1.24)
GFR calc Af Amer: >60 mL/min (ref >60)
GFR calc non Af Amer: >60 mL/min (ref >60)
Glucose, Bld: 82 mg/dL (ref 70–99)
Potassium: 3.8 mmol/L (ref 3.5–5.1)
Sodium: 141 mmol/L (ref 135–145)

## 2020-06-01 LAB — CBC
HCT: 45.6 % (ref 39.0–52.0)
Hemoglobin: 14.4 g/dL (ref 13.0–17.0)
MCH: 26.2 pg (ref 26.0–34.0)
MCHC: 31.6 g/dL (ref 30.0–36.0)
MCV: 82.9 fL (ref 80.0–100.0)
Platelets: 286 10*3/uL (ref 150–400)
RBC: 5.5 MIL/uL (ref 4.22–5.81)
RDW: 14.7 % (ref 11.5–15.5)
WBC: 5.9 10*3/uL (ref 4.0–10.5)
nRBC: 0 % (ref 0.0–0.2)

## 2020-06-01 LAB — SARS CORONAVIRUS 2 (TAT 6-24 HRS): SARS Coronavirus 2: NEGATIVE

## 2020-06-01 NOTE — Telephone Encounter (Signed)
Wife informed. She decided to come pick up the letter.

## 2020-06-01 NOTE — Progress Notes (Signed)
PCP - Dr. Phill Myron Cardiologist - denies  Chest x-ray - 09/21/19 EKG - 08/02/19 Stress Test -denies  ECHO - 08/03/19 Cardiac Cath -denies   Sleep Study - denies CPAP - denies  Blood Thinner Instructions: N/A Aspirin Instructions:N/A  COVID TEST- Today after PAT appointment.    Anesthesia review: Yes, hx of stroke.   Patient denies shortness of breath, fever, cough and chest pain at PAT appointment   All instructions explained to the patient, with a verbal understanding of the material. Patient agrees to go over the instructions while at home for a better understanding. Patient also instructed to self quarantine after being tested for COVID-19. The opportunity to ask questions was provided.   Coronavirus Screening  Have you experienced the following symptoms:  Cough yes/no: No Fever (>100.74F)  yes/no: No Runny nose yes/no: No Sore throat yes/no: No Difficulty breathing/shortness of breath  yes/no: No  Have you or a family member traveled in the last 14 days and where? yes/no: No   If the patient indicates "YES" to the above questions, their PAT will be rescheduled to limit the exposure to others and, the surgeon will be notified. THE PATIENT WILL NEED TO BE ASYMPTOMATIC FOR 14 DAYS.   If the patient is not experiencing any of these symptoms, the PAT nurse will instruct them to NOT bring anyone with them to their appointment since they may have these symptoms or traveled as well.   Please remind your patients and families that hospital visitation restrictions are in effect and the importance of the restrictions.

## 2020-06-01 NOTE — Telephone Encounter (Signed)
Mr. Gelles would like to know when he could start driving again?

## 2020-06-01 NOTE — Telephone Encounter (Signed)
Alease Medina (Spouse) has been informed. Mr. Schirtzinger voicemail is currently full.

## 2020-06-01 NOTE — Pre-Procedure Instructions (Signed)
Your procedure is scheduled on Tuesday, July 13th.  Report to [facility] Main Entrance "A" at 5:30 A.M., and check in at the Admitting office.  Call this number if you have problems the morning of surgery:  [phone]  Call [phone] if you have any questions prior to your surgery date Monday-Friday 8am-4pm    Remember:  Do not eat or drink after midnight the night before your surgery    Take these medicines the morning of surgery with A SIP OF WATER  baclofen (LIORESAL)  metoprolol tartrate (LOPRESSOR) acetaminophen (TYLENOL) - if needed for moderate pain traMADol (ULTRAM) -if needed for severe pain  As of today, STOP taking any Aspirin (unless otherwise instructed by your surgeon) Aleve, Naproxen, Ibuprofen, Motrin, Advil, Goody's, BC's, all herbal medications, fish oil, and all vitamins.                      Do not wear jewelry.            Do not wear lotions, powders, colognes, or deodorant.            Men may shave face and neck.            Do not bring valuables to the hospital.            [facility] is not responsible for any belongings or valuables.  Do NOT Smoke (Tobacco/Vaping) or drink Alcohol 24 hours prior to your procedure If you use a CPAP at night, you may bring all equipment for your overnight stay.   Contacts, glasses, dentures or bridgework may not be worn into surgery.      For patients admitted to the hospital, discharge time will be determined by your treatment team.   Patients discharged the day of surgery will not be allowed to drive home, and someone needs to stay with them for 24 hours.    Special instructions:   [facility]- Preparing For Surgery  Before surgery, you can play an important role. Because skin is not sterile, your skin needs to be as free of germs as possible. You can reduce the number of germs on your skin by washing with CHG (chlorahexidine gluconate) Soap before surgery.  CHG is an antiseptic cleaner which kills germs and  bonds with the skin to continue killing germs even after washing.    Oral Hygiene is also important to reduce your risk of infection.  Remember - BRUSH YOUR TEETH THE MORNING OF SURGERY WITH YOUR REGULAR TOOTHPASTE  Please do not use if you have an allergy to CHG or antibacterial soaps. If your skin becomes reddened/irritated stop using the CHG.  Do not shave (including legs and underarms) for at least 48 hours prior to first CHG shower. It is OK to shave your face.  Please follow these instructions carefully.   1. Shower the NIGHT BEFORE SURGERY and the MORNING OF SURGERY with CHG Soap.   2. If you chose to wash your hair, wash your hair first as usual with your normal shampoo.  3. After you shampoo, rinse your hair and body thoroughly to remove the shampoo.  4. Use CHG as you would any other liquid soap. You can apply CHG directly to the skin and wash gently with a scrungie or a clean washcloth.   5. Apply the CHG Soap to your body ONLY FROM THE NECK DOWN.  Do not use on open wounds or open sores. Avoid contact with your eyes, ears, mouth and  genitals (private parts). Wash Face and genitals (private parts)  with your normal soap.   6. Wash thoroughly, paying special attention to the area where your surgery will be performed.  7. Thoroughly rinse your body with warm water from the neck down.  8. DO NOT shower/wash with your normal soap after using and rinsing off the CHG Soap.  9. Pat yourself dry with a CLEAN TOWEL.  10. Wear CLEAN PAJAMAS to bed the night before surgery  11. Place CLEAN SHEETS on your bed the night of your first shower and DO NOT SLEEP WITH PETS.   Day of Surgery: Wear Clean/Comfortable clothing the morning of surgery Do not apply any deodorants/lotions.   Remember to brush your teeth WITH YOUR REGULAR TOOTHPASTE.   Please read over the following fact sheets that you were given.

## 2020-06-01 NOTE — Telephone Encounter (Signed)
Based on last visit, he is not able to start driving again. He would need to follow-up for re-eval before he may be cleared to drive.

## 2020-06-04 ENCOUNTER — Encounter (HOSPITAL_COMMUNITY): Payer: Self-pay

## 2020-06-04 NOTE — Progress Notes (Signed)
Anesthesia Chart Review:  Case: [id number] Date/Time: 06/05/20 0715   Procedure: CRANIOPLASTY (Right )   Anesthesia type: General   Pre-op diagnosis: SKULL DEFECT   Location: [facility] OR ROOM 87 / [place] [facility]   Surgeons: Consuella Lose, MD      DISCUSSION: Patient is a 44 year old male scheduled for the above procedure.  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 (s/p I&D 09/22/14, 12/20/15, 10/08/16; s/p excision benign scalp lesion with layered closure by plastic surgery 07/21/17, 11/10/17), right CVA with left hemiplegia (late presentation, found down at home 07/18/17 by co-worker; Head CT: large right hemisphere infarct with edema R ACA and R MCA territories and RICA, R A1, R MCA occlusion with cerebral edema s/p decompressive right hemicraniectomy 07/18/17 [question if CVA from RICA dissection from vigorous exercise]; developed hemorrhagic conversion with hematoma 07/30/19; developed large right anterior abdominal wall hematoma at bone flap site 08/04/19; significant increase hemorrhage with increased herniation through craniectomy defect 08/14/19 and heparin d/c'd; 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 Had hematology consult with Sullivan Lone and "No overt evidence of MPN, hemoglobinopathy or overt thrombophilia based on labs" 08/10/19; Discharged to CIR 09/06/19, then home 10/07/19), DVT (acute BLE DVT 07/21/19 s/p IVC filter 07/21/19; BLE DVT 08/02/19), PE (non-occlusive left posterior lower lobe segmental artery PE 08/01/19), right ureteral calculus (s/p laser lithotripsy, ureteral stent 03/16/20).  06/01/2020 presurgical COVID-19 test negative. Anesthesia team to evaluate on the day of surgery.   VS: BP 102/75   Pulse 65   Resp 18   Ht 6' (1.829 m)    Wt 86.2 kg Comment: Pt cannot stand for weight  SpO2 97%   BMI 25.77 kg/m     PROVIDERS: Nicolette Bang, DO is PCP - Wendie Simmer, MD is urologist - He was seen by IR Markus Daft, MD on 12/13/18. Decision to keep IVC filter in for now since patient was still rather immobile. Follow-up in six month to re-evaluate. - He was evaluated by Alonza Bogus, PA-C with GI on 02/14/20 for question of perirectal phlegmon on 12/01/18 CT. (Reportedly surgery saw him during that admission and did not see evidence of perirectal abscess, but was treated with antibiotics.) He had one episode of bleeding upon wiping but no drainage. Given recent CVA, he was felt high risk for colonoscopy at that time, so plan to follow-up with MRI Pelvis which is still pending. - He was seen by neurologists Rosalin Hawking, MD and Antony Contras, MD during CVA admission   LABS: Labs reviewed: Acceptable for surgery. (all labs ordered are listed, but only abnormal results are displayed)  Labs Reviewed  BASIC METABOLIC PANEL - Abnormal; Notable for the following components:      Result Value   CO2 21 (*)    All other components within normal limits  CBC     OTHER: Overnight EEG with video 07/21/19: IMPRESSION:  - This study issuggestive of cortical dysfunction in right frontal region, likely secondary to underlying stroke and cerebral edema. There is also severe diffuse encephalopathy, likely due to sedation.No seizures or epileptiform discharges were seen throughout the recording.  - Three events were captured as described above during which patient appeared to have non rhythmic movements more pronounced on right side. Concomitant EEG did not show any seizures and therefore, these events are likely  non epileptic.    IMAGES: CTA neck 07/21/19: IMPRESSION: 1. Stable from prior MRA. 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 No evidence of atherosclerosis or  vasculopathy in the other vessels. 2. Cytotoxic edema causes 5 mm of midline shift and brain bulging through the craniectomy defect. Mild petechial hemorrhage is seen at the basal ganglia.   EKG: 08/02/19: ST at 118 bpm   CV: Limited Echo for Bubble Study 08/03/19: SUMMARY  - Limited bubble study for shunt. No evidence for atrial level right  to left shunt.  FINDINGS  - Interatrial Septum: Agitated saline contrast was given intravenously to  evaluate for intracardiac shunting.    BLE Venous US 08/02/19: Summary:  Right: Findings consistent with acute deep vein thrombosis involving the  right peroneal veins.  Left: Findings consistent with acute deep vein thrombosis involving the  left common femoral vein, left femoral vein, left proximal profunda vein,  left popliteal vein, left posterior tibial veins, and left peroneal veins.  Extending up into left iliac vein  and IVC.    Transcranial Doppler with Bubble Study 07/25/19: Summary:  - A vascular evaluation was performed. The left Opthalmic Artery was  studied. An IV was inserted into the patient's right PICC line. Verbal  informed consent was obtained.  - No HITS heard heard at rest.  - No HITS heard heard during valsalva.  - Negative TCD Bubble study    Echo 07/20/19: IMPRESSIONS  1. The left ventricle has normal systolic function with an ejection  fraction of 60-65%. The cavity size was normal. Left ventricular diastolic  parameters were normal.  2. The right ventricle has normal systolic function. The cavity was  normal. There is no increase in right ventricular wall thickness.  3. The pericardial effusion is circumferential.  4. Trivial pericardial effusion is present.  5. The mitral valve is grossly normal.  6. The tricuspid valve is grossly normal.  7. The aortic valve is tricuspid. No stenosis of the aortic valve.  8. The aorta is normal unless otherwise noted.  9. The aortic root is normal in size and  structure.  10. No cardiac source of embolism identified.  11. When compared to the prior study: No comparison.    Past Medical History:  Diagnosis Date  . 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 Depression   . DVT (deep venous thrombosis) ([place])    BLE DVT 07/21/19, 08/02/19; s/p retrievable IVC filter 07/21/19  . Hemorrhagic stroke ([place])   . History of kidney stones   . Hypertension   . 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 PE (pulmonary thromboembolism) ([place])    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  . Stroke [facility])    RICA, R A1, R MCA occlusion 07/19/19    Past Surgical History:  Procedure Laterality Date  . CRANIOTOMY Right 07/19/2019   Procedure: RIGHT HEMI-CRANIECTOMY With implantation of skull flap to abdominal wall;  Surgeon: Consuella Lose, MD;  Location: [place];  Service: Neurosurgery;  Laterality: Right;  . CYST EXCISION N/A 10/08/2016   Procedure: EXCISION OF POSTERIOR NECK CYST;  Surgeon: Clovis Riley, MD;  [facility];  Service: General;  Laterality: N/A;  . CYSTOSCOPY/URETEROSCOPY/HOLMIUM LASER/STENT PLACEMENT Right 03/16/2020   Procedure: CYSTOSCOPY RIGHT RETROGRADE PYELOGRAM URETEROSCOPY/HOLMIUM LASER/STENT PLACEMENT;  Surgeon: Lucas Mallow, MD;  [facility];  Service: Urology;  Laterality: Right;  . INCISION AND DRAINAGE ABSCESS N/A  09/22/2014   Procedure: INCISION AND DRAINAGE ABSCESS POSTERIOR NECK;  Surgeon: Pedro Earls, MD;  [facility];  Service: General;  Laterality: N/A;  . INCISION AND DRAINAGE ABSCESS N/A 12/20/2015   Procedure: INCISION AND DRAINAGE POSTERIOR NECK MASS;  Surgeon: Armandina Gemma, MD;  [facility];  Service: General;  Laterality: N/A;  . INCISION AND DRAINAGE ABSCESS Left 07/10/2004   middle finger  . IR IVC FILTER PLMT / S&I /IMG GUID/MOD SED  07/21/2019  . IR RADIOLOGIST EVAL & MGMT  12/14/2019  . IR VENOGRAM RENAL UNI RIGHT  07/21/2019  . MASS EXCISION N/A 07/21/2017   Procedure: EXCISION OF  BENIGN NECK LESION WITH LAYERED CLOSURE;  Surgeon: Irene Limbo, MD;  Location: [place];  Service: Plastics;  Laterality: N/A;  . MASS EXCISION N/A 11/10/2017   Procedure: EXCISION BENIGN LESION OF THE NECK WITH LAYERED CLOSURE;  Surgeon: Irene Limbo, MD;  Location: [place];  Service: Plastics;  Laterality: N/A;    MEDICATIONS: . acetaminophen (TYLENOL) 325 MG tablet  . baclofen (LIORESAL) 10 MG tablet  . docusate sodium (COLACE) 100 MG capsule  . doxycycline (VIBRA-TABS) 100 MG tablet  . famotidine (PEPCID) 20 MG tablet  . metoprolol tartrate (LOPRESSOR) 50 MG tablet  . oxyCODONE (ROXICODONE) 5 MG immediate release tablet  . traMADol (ULTRAM) 50 MG tablet   No current facility-administered medications for this encounter.  He is not currently taking Colace, doxycycline, Pepcid, oxycodone.   Myra Gianotti, PA-C Surgical Short Stay/Anesthesiology [facility] Phone [phone] [facility] Phone [phone] 06/04/2020 11:11 AM

## 2020-06-04 NOTE — H&P (Signed)
Chief Complaint   Stroke, craniectomy  HPI   HPI: GEROGE Black is a 44 y.o. 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  He presents today for cranioplasty.  He is without any concerns.   Patient Active Problem List   Diagnosis Date Noted  . Peri-rectal abscess 02/14/2020  . Abnormal CT scan, pelvis 02/14/2020  . Pancytopenia ([place]) 12/02/2019  . Nephrolithiasis 12/02/2019  . Hydronephrosis with renal and ureteral calculus obstruction 12/02/2019  . Rectal pain 12/02/2019  . Hyperkalemia 12/02/2019  . Reactive depression   . Wound infection after surgery   . Sleep disturbance   . Dysphagia, post-stroke   . Transaminitis   . Right middle cerebral artery stroke ([place]) 09/06/2019  . Cerebral abscess   . Urinary tract infection without hematuria   . Altered mental status   . Primary hypercoagulable state ([place])   . Acute pulmonary embolism without acute cor pulmonale (HCC)   . Deep vein thrombosis (DVT) of non-extremity vein   . Hypokalemia   . Acute blood loss anemia   . Leukocytosis   . Endotracheal tube present   . Acute respiratory failure with hypoxemia ([place])   . Stroke (cerebrum) ([place]) 07/19/2019  . Pressure injury of skin 07/19/2019  . Acute CVA (cerebrovascular accident) ([place])   . Encephalopathy   . Dysphagia   . Acute encephalopathy   . Essential hypertension   . Obesity 03/12/2016  . Scalp abscess 12/20/2015  . Neck abscess 12/20/2015  . Pilonidal cyst 02/08/2013    PMH: Past Medical History:  Diagnosis Date  . 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 Depression   . DVT (deep venous thrombosis) ([place])    BLE DVT 07/21/19, 08/02/19; s/p retrievable IVC filter 07/21/19  . Hemorrhagic stroke ([place])   . History of kidney stones   . Hypertension   . 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 PE (pulmonary thromboembolism) ([place])    08/01/19 non-occlusieve left posterior  lower lobe segmental artery PE  . Stroke [facility])    RICA, R A1, R MCA occlusion 07/19/19    PSH: Past Surgical History:  Procedure Laterality Date  . CRANIOTOMY Right 07/19/2019   Procedure: RIGHT HEMI-CRANIECTOMY With implantation of skull flap to abdominal wall;  Surgeon: Consuella Lose, MD;  Location: [place];  Service: Neurosurgery;  Laterality: Right;  . CYST EXCISION N/A 10/08/2016   Procedure: EXCISION OF POSTERIOR NECK CYST;  Surgeon: Clovis Riley, MD;  [facility];  Service: General;  Laterality: N/A;  . CYSTOSCOPY/URETEROSCOPY/HOLMIUM LASER/STENT PLACEMENT Right 03/16/2020   Procedure: CYSTOSCOPY RIGHT RETROGRADE PYELOGRAM URETEROSCOPY/HOLMIUM LASER/STENT PLACEMENT;  Surgeon: Lucas Mallow, MD;  [facility];  Service: Urology;  Laterality: Right;  . INCISION AND DRAINAGE ABSCESS N/A 09/22/2014   Procedure: INCISION AND DRAINAGE ABSCESS POSTERIOR NECK;  Surgeon: Pedro Earls, MD;  [facility];  Service: General;  Laterality: N/A;  . INCISION AND DRAINAGE ABSCESS N/A 12/20/2015   Procedure: INCISION AND DRAINAGE POSTERIOR NECK MASS;  Surgeon: Armandina Gemma, MD;  [facility];  Service: General;  Laterality: N/A;  . INCISION AND DRAINAGE ABSCESS Left 07/10/2004   middle finger  . IR IVC FILTER PLMT / S&I /IMG GUID/MOD SED  07/21/2019  . IR RADIOLOGIST EVAL & MGMT  12/14/2019  . IR VENOGRAM RENAL UNI RIGHT  07/21/2019  . MASS EXCISION N/A 07/21/2017   Procedure: EXCISION  OF BENIGN NECK LESION WITH LAYERED CLOSURE;  Surgeon: Irene Limbo, MD;  Location: [place];  Service: Plastics;  Laterality: N/A;  . MASS EXCISION N/A 11/10/2017   Procedure: EXCISION BENIGN LESION OF THE NECK WITH LAYERED CLOSURE;  Surgeon: Irene Limbo, MD;  Location: [place];  Service: Plastics;  Laterality: N/A;    No medications prior to admission.    SH: Social History   Tobacco Use  . Smoking status: Former Smoker    Packs/day: 0.00     Quit date: 11/24/2015    Years since quitting: 4.5  . Smokeless tobacco: Never Used  . Tobacco comment:    Vaping Use  . Vaping Use: Never used  Substance Use Topics  . Alcohol use: Not Currently  . Drug use: Never    MEDS: Prior to Admission medications   Medication Sig Start Date End Date Taking? Authorizing Provider  acetaminophen (TYLENOL) 325 MG tablet Take 650 mg by mouth every 6 (six) hours as needed for moderate pain.    Yes [provider]  baclofen (LIORESAL) 10 MG tablet TAKE 1 TABLET BY MOUTH TWICE A DAY Patient taking differently: Take 10 mg by mouth 2 (two) times daily.  01/12/20  Yes Kirsteins, Luanna Salk, MD  metoprolol tartrate (LOPRESSOR) 50 MG tablet Take 1 tablet (50 mg total) by mouth 2 (two) times daily. 01/24/20  Yes Raulkar, Clide Deutscher, MD  traMADol (ULTRAM) 50 MG tablet Take 1 tablet (50 mg total) by mouth 2 (two) times daily as needed. 02/21/20  Yes Bayard Hugger, NP  docusate sodium (COLACE) 100 MG capsule Take 1 capsule (100 mg total) by mouth 2 (two) times daily. Patient not taking: Reported on 05/22/2020 03/08/20   Izora Ribas, MD  doxycycline (VIBRA-TABS) 100 MG tablet Take 100 mg by mouth 2 (two) times daily. Patient not taking: Reported on 05/22/2020    [provider]  famotidine (PEPCID) 20 MG tablet Take 1 tablet (20 mg total) by mouth 2 (two) times daily. Patient not taking: Reported on 05/22/2020 03/01/20   Raulkar, Clide Deutscher, MD  oxyCODONE (ROXICODONE) 5 MG immediate release tablet Take 1 tablet (5 mg total) by mouth every 8 (eight) hours as needed. Patient not taking: Reported on 05/22/2020 03/16/20 03/16/21  Lucas Mallow, MD    ALLERGY: Allergies  Allergen Reactions  . Hydrocodone Nausea Only    Social History   Tobacco Use  . Smoking status: Former Smoker    Packs/day: 0.00    Quit date: 11/24/2015    Years since quitting: 4.5  . Smokeless tobacco: Never Used  . Tobacco comment:    Substance Use Topics  .  Alcohol use: Not Currently     Family History  Problem Relation Age of Onset  . Healthy Mother   . Healthy Father   . Clotting disorder Maternal Grandmother   . Diabetes Paternal Grandfather   . Clotting disorder Maternal Aunt   . Clotting disorder Maternal Uncle   . Colon cancer Neg Hx      ROS   ROS  Exam   There were no vitals filed for this visit. General appearance: WDWN, NAD Speech slow but appropriate Stable left hemiplegia Right flap is sunken, well healed Abdominal incision is well healed, bone is palpable.  Results - Imaging/Labs   No results found for this or any previous visit (from the past 48 hour(s)).  No results found.  IMAGING: CT scan of the brain dated 11/24/2019  was personally reviewed.  This again demonstrates the right hemispheric stroke.  There is a slightly hyperdense lesion in the right frontal region which was not present on the prior scan of November 2020.   Impression/Plan   44 y.o. male   Nearly 1 year status post craniectomy for right hemispheric stroke.  Scalp was sunken and at this point he is a candidate for cranioplasty.  We will proceed with right-sided cranioplasty with harvest of the abdominal bone flap.  We have reviewed in detail in the office the risks, benefits and alternatives with both the patient and his wife.  They stated understanding and wished to proceed.  Consuella Lose, MD [facility]

## 2020-06-04 NOTE — Anesthesia Preprocedure Evaluation (Addendum)
Anesthesia Evaluation  Patient identified by MRN, date of birth, ID band Patient awake    Reviewed: Allergy & Precautions, NPO status , Patient's Chart, lab work & pertinent test results, reviewed documented beta blocker date and time   Airway Mallampati: II  TM Distance: >3 FB Neck ROM: Full    Dental no notable dental hx. (+) Teeth Intact, Missing, Dental Advisory Given,    Pulmonary former smoker, PE PE and B/L DVT 07/2019- s/p IVC filter  Former smoker, quit 2016   Pulmonary exam normal breath sounds clear to auscultation       Cardiovascular hypertension, Pt. on medications and Pt. 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 (s/p I&D 09/22/14, 12/20/15, 10/08/16; s/p excision benign scalp lesion with layered closure by plastic surgery 07/21/17, 11/10/17), right CVA with left hemiplegia (late presentation, found down at home 07/18/17 by co-worker; Head CT: large right hemisphere infarct with edema R ACA and R MCA territories and RICA, R A1, R MCA occlusion with cerebral edema s/p decompressive right hemicraniectomy 07/18/17 (question if CVA from RICA dissection from vigorous exercise); developed hemorrhagic conversion with hematoma 07/30/19; developed large right anterior abdominal wall hematoma at bone flap site 08/04/19; significant increase hemorrhage with increased herniation through craniectomy defect 08/14/19 and heparin d/c'd; 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 with surgical debridement felt too risky for worsening brain herniation, s/p aspiration 08/21/19; CVA, Residual Symptoms    GI/Hepatic negative GI ROS, Neg liver ROS,   Endo/Other  negative endocrine ROS  Renal/GU negative Renal ROS  negative  genitourinary   Musculoskeletal negative musculoskeletal ROS (+)   Abdominal Normal abdominal exam  (+)   Peds  Hematology negative hematology ROS (+) hct 45.6, plt 286   Anesthesia Other Findings   Reproductive/Obstetrics negative OB ROS                           Anesthesia Physical Anesthesia Plan  ASA: III  Anesthesia Plan: General   Post-op Pain Management:    Induction: Intravenous  PONV Risk Score and Plan: 2 and Ondansetron, Dexamethasone and Treatment may vary due to age or medical condition  Airway Management Planned: Oral ETT  Additional Equipment: None  Intra-op Plan:   Post-operative Plan: Extubation in OR  Informed Consent: I have reviewed the patients History and Physical, chart, labs and discussed the procedure including the risks, benefits and alternatives for the proposed anesthesia with the patient or authorized representative who has indicated his/her understanding and acceptance.     Dental advisory given  Plan Discussed with: CRNA  Anesthesia Plan Comments: (2 large bore PIVs, type and screen )      Anesthesia Quick Evaluation

## 2020-06-05 ENCOUNTER — Inpatient Hospital Stay (HOSPITAL_COMMUNITY)
Admission: RE | Admit: 2020-06-05 | Discharge: 2020-06-06 | DRG: 983 | Disposition: A | Discharge status: Home / Self Care | Payer: Medicaid Other | Attending: Neurosurgery | Admitting: Neurosurgery

## 2020-06-05 ENCOUNTER — Other Ambulatory Visit: Payer: Self-pay

## 2020-06-05 ENCOUNTER — Inpatient Hospital Stay (HOSPITAL_COMMUNITY): Payer: Medicaid Other | Admitting: Anesthesiology

## 2020-06-05 ENCOUNTER — Encounter (HOSPITAL_COMMUNITY)
Admission: RE | Disposition: A | Discharge status: Home / Self Care | Payer: Self-pay | Source: Home / Self Care | Attending: Neurosurgery

## 2020-06-05 ENCOUNTER — Ambulatory Visit: Payer: Medicaid Other

## 2020-06-05 ENCOUNTER — Encounter (HOSPITAL_COMMUNITY): Payer: Self-pay | Admitting: Neurosurgery

## 2020-06-05 ENCOUNTER — Inpatient Hospital Stay (HOSPITAL_COMMUNITY): Payer: Medicaid Other | Admitting: Vascular Surgery

## 2020-06-05 DIAGNOSIS — Z833 Family history of diabetes mellitus: Secondary | ICD-10-CM

## 2020-06-05 DIAGNOSIS — Z8673 Personal history of transient ischemic attack (TIA), and cerebral infarction without residual deficits: Secondary | ICD-10-CM | POA: Diagnosis not present

## 2020-06-05 DIAGNOSIS — Z87891 Personal history of nicotine dependence: Secondary | ICD-10-CM | POA: Diagnosis not present

## 2020-06-05 DIAGNOSIS — Z86718 Personal history of other venous thrombosis and embolism: Secondary | ICD-10-CM | POA: Diagnosis not present

## 2020-06-05 DIAGNOSIS — J9601 Acute respiratory failure with hypoxia: Secondary | ICD-10-CM | POA: Diagnosis not present

## 2020-06-05 DIAGNOSIS — Z428 Encounter for other plastic and reconstructive surgery following medical procedure or healed injury: Secondary | ICD-10-CM | POA: Diagnosis not present

## 2020-06-05 DIAGNOSIS — M952 Other acquired deformity of head: Secondary | ICD-10-CM | POA: Diagnosis not present

## 2020-06-05 DIAGNOSIS — Z832 Family history of diseases of the blood and blood-forming organs and certain disorders involving the immune mechanism: Secondary | ICD-10-CM | POA: Diagnosis not present

## 2020-06-05 DIAGNOSIS — I10 Essential (primary) hypertension: Secondary | ICD-10-CM | POA: Diagnosis not present

## 2020-06-05 DIAGNOSIS — E876 Hypokalemia: Secondary | ICD-10-CM | POA: Diagnosis not present

## 2020-06-05 DIAGNOSIS — Z9889 Other specified postprocedural states: Secondary | ICD-10-CM

## 2020-06-05 DIAGNOSIS — Z86711 Personal history of pulmonary embolism: Secondary | ICD-10-CM | POA: Diagnosis not present

## 2020-06-05 DIAGNOSIS — Z885 Allergy status to narcotic agent status: Secondary | ICD-10-CM

## 2020-06-05 HISTORY — PX: CRANIOPLASTY: SHX1407

## 2020-06-05 LAB — TYPE AND SCREEN
ABO/RH(D): B POS
Antibody Screen: NEGATIVE

## 2020-06-05 LAB — ABO/RH: ABO/RH(D): B POS

## 2020-06-05 LAB — GLUCOSE, CAPILLARY
Glucose-Capillary: 102 mg/dL — ABNORMAL HIGH (ref 70–99)
Glucose-Capillary: 132 mg/dL — ABNORMAL HIGH (ref 70–99)
Glucose-Capillary: 135 mg/dL — ABNORMAL HIGH (ref 70–99)

## 2020-06-05 LAB — MRSA PCR SCREENING: MRSA by PCR: NEGATIVE

## 2020-06-05 SURGERY — CRANIOPLASTY
Anesthesia: General | Site: Head | Laterality: Right

## 2020-06-05 MED ORDER — ACETAMINOPHEN 500 MG PO TABS: 1000.0000 mg | ORAL_TABLET | Freq: Once | ORAL | Status: AC

## 2020-06-05 MED ORDER — LABETALOL HCL 5 MG/ML IV SOLN: 10.0000 mg | INTRAVENOUS | Status: DC | PRN

## 2020-06-05 MED ORDER — LIDOCAINE 2% (20 MG/ML) 5 ML SYRINGE
INTRAMUSCULAR | Status: AC
  Filled 2020-06-05: qty 5

## 2020-06-05 MED ORDER — DEXAMETHASONE SODIUM PHOSPHATE 4 MG/ML IJ SOLN
INTRAMUSCULAR | Status: DC | PRN
Start: 2020-06-05 — End: 2020-06-05
  Administered 2020-06-05: 10 mg via INTRAVENOUS

## 2020-06-05 MED ORDER — PROPOFOL 10 MG/ML IV BOLUS
INTRAVENOUS | Status: AC
  Filled 2020-06-05: qty 40

## 2020-06-05 MED ORDER — SODIUM CHLORIDE 0.9 % IV SOLN: INTRAVENOUS | Status: DC

## 2020-06-05 MED ORDER — PROMETHAZINE HCL 25 MG PO TABS: 12.5000 mg | ORAL_TABLET | ORAL | Status: DC | PRN

## 2020-06-05 MED ORDER — PHENYLEPHRINE HCL-NACL 10-0.9 MG/250ML-% IV SOLN
INTRAVENOUS | Status: DC | PRN
Start: 2020-06-05 — End: 2020-06-05
  Administered 2020-06-05: 40 ug/min via INTRAVENOUS

## 2020-06-05 MED ORDER — BACITRACIN ZINC 500 UNIT/GM EX OINT
TOPICAL_OINTMENT | CUTANEOUS | Status: DC | PRN
  Administered 2020-06-05: 1 via TOPICAL

## 2020-06-05 MED ORDER — BUPIVACAINE HCL (PF) 0.5 % IJ SOLN
INTRAMUSCULAR | Status: AC
  Filled 2020-06-05: qty 30

## 2020-06-05 MED ORDER — CHLORHEXIDINE GLUCONATE 0.12 % MT SOLN: 15.0000 mL | Freq: Once | OROMUCOSAL | Status: AC

## 2020-06-05 MED ORDER — HYDROMORPHONE HCL 1 MG/ML IJ SOLN
0.2500 mg | INTRAMUSCULAR | Status: DC | PRN
  Administered 2020-06-05 (×2): 0.5 mg via INTRAVENOUS

## 2020-06-05 MED ORDER — SODIUM CHLORIDE 0.9 % IV SOLN
INTRAVENOUS | Status: DC | PRN
  Administered 2020-06-05: 500 mL

## 2020-06-05 MED ORDER — ONDANSETRON HCL 4 MG/2ML IJ SOLN
4.0000 mg | INTRAMUSCULAR | Status: DC | PRN
  Administered 2020-06-05 – 2020-06-06 (×2): 4 mg via INTRAVENOUS
  Filled 2020-06-05 (×2): qty 2

## 2020-06-05 MED ORDER — ACETAMINOPHEN 325 MG PO TABS
650.0000 mg | ORAL_TABLET | ORAL | Status: DC | PRN
  Administered 2020-06-06: 650 mg via ORAL
  Filled 2020-06-05: qty 2

## 2020-06-05 MED ORDER — POLYETHYLENE GLYCOL 3350 17 G PO PACK: 17.0000 g | PACK | Freq: Every day | ORAL | Status: DC | PRN

## 2020-06-05 MED ORDER — BUPIVACAINE HCL (PF) 0.5 % IJ SOLN
INTRAMUSCULAR | Status: DC | PRN
  Administered 2020-06-05: 6 mL

## 2020-06-05 MED ORDER — LACTATED RINGERS IV SOLN: INTRAVENOUS | Status: DC

## 2020-06-05 MED ORDER — HYDROCODONE-ACETAMINOPHEN 5-325 MG PO TABS
1.0000 | ORAL_TABLET | ORAL | Status: DC | PRN
  Administered 2020-06-06: 1 via ORAL
  Filled 2020-06-05: qty 1

## 2020-06-05 MED ORDER — HYDROMORPHONE HCL 1 MG/ML IJ SOLN
0.5000 mg | INTRAMUSCULAR | Status: DC | PRN
  Administered 2020-06-05 – 2020-06-06 (×3): 0.5 mg via INTRAVENOUS
  Filled 2020-06-05 (×3): qty 1

## 2020-06-05 MED ORDER — PHENYLEPHRINE HCL (PRESSORS) 10 MG/ML IV SOLN
INTRAVENOUS | Status: DC | PRN
Start: 2020-06-05 — End: 2020-06-05
  Administered 2020-06-05: 80 ug via INTRAVENOUS

## 2020-06-05 MED ORDER — THROMBIN 5000 UNITS EX SOLR
OROMUCOSAL | Status: DC | PRN
  Administered 2020-06-05: 5 mL via TOPICAL

## 2020-06-05 MED ORDER — FENTANYL CITRATE (PF) 100 MCG/2ML IJ SOLN
INTRAMUSCULAR | Status: DC | PRN
  Administered 2020-06-05: 25 ug via INTRAVENOUS
  Administered 2020-06-05: 100 ug via INTRAVENOUS
  Administered 2020-06-05: 25 ug via INTRAVENOUS

## 2020-06-05 MED ORDER — ORAL CARE MOUTH RINSE: 15.0000 mL | Freq: Once | OROMUCOSAL | Status: AC

## 2020-06-05 MED ORDER — CHLORHEXIDINE GLUCONATE CLOTH 2 % EX PADS: 6.0000 | MEDICATED_PAD | Freq: Once | CUTANEOUS | Status: DC

## 2020-06-05 MED ORDER — BACLOFEN 10 MG PO TABS
10.0000 mg | ORAL_TABLET | Freq: Two times a day (BID) | ORAL | Status: DC
  Administered 2020-06-05 – 2020-06-06 (×3): 10 mg via ORAL
  Filled 2020-06-05 (×3): qty 1

## 2020-06-05 MED ORDER — ROCURONIUM BROMIDE 10 MG/ML (PF) SYRINGE
PREFILLED_SYRINGE | INTRAVENOUS | Status: AC
  Filled 2020-06-05: qty 10

## 2020-06-05 MED ORDER — PANTOPRAZOLE SODIUM 40 MG IV SOLR
40.0000 mg | Freq: Every day | INTRAVENOUS | Status: DC
  Administered 2020-06-05: 40 mg via INTRAVENOUS
  Filled 2020-06-05: qty 40

## 2020-06-05 MED ORDER — FLEET ENEMA 7-19 GM/118ML RE ENEM: 1.0000 | ENEMA | Freq: Once | RECTAL | Status: DC | PRN

## 2020-06-05 MED ORDER — SENNA 8.6 MG PO TABS
1.0000 | ORAL_TABLET | Freq: Two times a day (BID) | ORAL | Status: DC
  Administered 2020-06-05: 8.6 mg via ORAL
  Filled 2020-06-05: qty 1

## 2020-06-05 MED ORDER — THROMBIN 20000 UNITS EX SOLR
CUTANEOUS | Status: DC | PRN
  Administered 2020-06-05: 20 mL via TOPICAL

## 2020-06-05 MED ORDER — PROMETHAZINE HCL 25 MG/ML IJ SOLN: 6.2500 mg | INTRAMUSCULAR | Status: DC | PRN

## 2020-06-05 MED ORDER — ONDANSETRON HCL 4 MG PO TABS: 4.0000 mg | ORAL_TABLET | ORAL | Status: DC | PRN

## 2020-06-05 MED ORDER — OXYCODONE HCL 5 MG PO TABS
5.0000 mg | ORAL_TABLET | ORAL | Status: DC | PRN
  Administered 2020-06-05: 5 mg via ORAL
  Administered 2020-06-06: 10 mg via ORAL
  Filled 2020-06-05: qty 1
  Filled 2020-06-05: qty 2

## 2020-06-05 MED ORDER — CHLORHEXIDINE GLUCONATE 0.12 % MT SOLN
OROMUCOSAL | Status: AC
  Administered 2020-06-05: 15 mL via OROMUCOSAL
  Filled 2020-06-05: qty 15

## 2020-06-05 MED ORDER — ONDANSETRON HCL 4 MG/2ML IJ SOLN
INTRAMUSCULAR | Status: AC
  Filled 2020-06-05: qty 2

## 2020-06-05 MED ORDER — THROMBIN 5000 UNITS EX SOLR
CUTANEOUS | Status: AC
  Filled 2020-06-05: qty 5000

## 2020-06-05 MED ORDER — SODIUM CHLORIDE 0.9 % IV SOLN
INTRAVENOUS | Status: DC | PRN
Start: 2020-06-05 — End: 2020-06-05

## 2020-06-05 MED ORDER — ACETAMINOPHEN 650 MG RE SUPP: 650.0000 mg | RECTAL | Status: DC | PRN

## 2020-06-05 MED ORDER — ACETAMINOPHEN 500 MG PO TABS
ORAL_TABLET | ORAL | Status: AC
  Administered 2020-06-05: 1000 mg via ORAL
  Filled 2020-06-05: qty 2

## 2020-06-05 MED ORDER — SUGAMMADEX SODIUM 200 MG/2ML IV SOLN
INTRAVENOUS | Status: DC | PRN
Start: 2020-06-05 — End: 2020-06-05
  Administered 2020-06-05: 200 mg via INTRAVENOUS

## 2020-06-05 MED ORDER — OXYCODONE HCL 5 MG PO TABS: 5.0000 mg | ORAL_TABLET | Freq: Once | ORAL | Status: DC | PRN

## 2020-06-05 MED ORDER — METOPROLOL TARTRATE 50 MG PO TABS
50.0000 mg | ORAL_TABLET | Freq: Two times a day (BID) | ORAL | Status: DC
  Administered 2020-06-05 – 2020-06-06 (×2): 50 mg via ORAL
  Filled 2020-06-05 (×2): qty 1

## 2020-06-05 MED ORDER — PROPOFOL 10 MG/ML IV BOLUS
INTRAVENOUS | Status: DC | PRN
  Administered 2020-06-05: 200 mg via INTRAVENOUS

## 2020-06-05 MED ORDER — ONDANSETRON HCL 4 MG/2ML IJ SOLN
INTRAMUSCULAR | Status: DC | PRN
  Administered 2020-06-05: 4 mg via INTRAVENOUS

## 2020-06-05 MED ORDER — LIDOCAINE 2% (20 MG/ML) 5 ML SYRINGE
INTRAMUSCULAR | Status: DC | PRN
  Administered 2020-06-05: 60 mg via INTRAVENOUS

## 2020-06-05 MED ORDER — MIDAZOLAM HCL 2 MG/2ML IJ SOLN
INTRAMUSCULAR | Status: AC
  Filled 2020-06-05: qty 2

## 2020-06-05 MED ORDER — SODIUM CHLORIDE 0.9 % IV SOLN: INTRAVENOUS | Status: DC | PRN

## 2020-06-05 MED ORDER — CEFAZOLIN SODIUM-DEXTROSE 2-4 GM/100ML-% IV SOLN
2.0000 g | Freq: Three times a day (TID) | INTRAVENOUS | Status: AC
  Administered 2020-06-05 – 2020-06-06 (×2): 2 g via INTRAVENOUS
  Filled 2020-06-05 (×2): qty 100

## 2020-06-05 MED ORDER — 0.9 % SODIUM CHLORIDE (POUR BTL) OPTIME
TOPICAL | Status: DC | PRN
  Administered 2020-06-05 (×2): 1000 mL

## 2020-06-05 MED ORDER — THROMBIN 20000 UNITS EX SOLR
CUTANEOUS | Status: AC
  Filled 2020-06-05: qty 20000

## 2020-06-05 MED ORDER — ROCURONIUM BROMIDE 10 MG/ML (PF) SYRINGE
PREFILLED_SYRINGE | INTRAVENOUS | Status: DC | PRN
  Administered 2020-06-05 (×2): 10 mg via INTRAVENOUS
  Administered 2020-06-05: 40 mg via INTRAVENOUS
  Administered 2020-06-05: 20 mg via INTRAVENOUS
  Administered 2020-06-05: 60 mg via INTRAVENOUS

## 2020-06-05 MED ORDER — OXYCODONE HCL 5 MG/5ML PO SOLN: 5.0000 mg | Freq: Once | ORAL | Status: DC | PRN

## 2020-06-05 MED ORDER — BISACODYL 5 MG PO TBEC: 5.0000 mg | DELAYED_RELEASE_TABLET | Freq: Every day | ORAL | Status: DC | PRN

## 2020-06-05 MED ORDER — DEXAMETHASONE SODIUM PHOSPHATE 10 MG/ML IJ SOLN
INTRAMUSCULAR | Status: AC
  Filled 2020-06-05: qty 1

## 2020-06-05 MED ORDER — HYDROMORPHONE HCL 1 MG/ML IJ SOLN
INTRAMUSCULAR | Status: AC
  Filled 2020-06-05: qty 1

## 2020-06-05 MED ORDER — FENTANYL CITRATE (PF) 250 MCG/5ML IJ SOLN
INTRAMUSCULAR | Status: AC
  Filled 2020-06-05: qty 5

## 2020-06-05 MED ORDER — BACITRACIN ZINC 500 UNIT/GM EX OINT
TOPICAL_OINTMENT | CUTANEOUS | Status: AC
  Filled 2020-06-05: qty 28.35

## 2020-06-05 MED ORDER — LIDOCAINE-EPINEPHRINE 1 %-1:100000 IJ SOLN
INTRAMUSCULAR | Status: DC | PRN
  Administered 2020-06-05: 6 mL

## 2020-06-05 MED ORDER — LIDOCAINE-EPINEPHRINE 1 %-1:100000 IJ SOLN
INTRAMUSCULAR | Status: AC
  Filled 2020-06-05: qty 1

## 2020-06-05 MED ORDER — CEFAZOLIN SODIUM-DEXTROSE 2-4 GM/100ML-% IV SOLN
INTRAVENOUS | Status: AC
  Filled 2020-06-05: qty 100

## 2020-06-05 MED ORDER — CEFAZOLIN SODIUM-DEXTROSE 2-4 GM/100ML-% IV SOLN
2.0000 g | INTRAVENOUS | Status: AC
  Administered 2020-06-05: 2 g via INTRAVENOUS

## 2020-06-05 MED ORDER — NALOXONE HCL 0.4 MG/ML IJ SOLN: 0.0800 mg | INTRAMUSCULAR | Status: DC | PRN

## 2020-06-05 SURGICAL SUPPLY — 80 items
APL SKNCLS STERI-STRIP NONHPOA (GAUZE/BANDAGES/DRESSINGS)
BENZOIN TINCTURE PRP APPL 2/3 (GAUZE/BANDAGES/DRESSINGS) IMPLANT
BLADE CLIPPER SURG (BLADE) ×3 IMPLANT
BNDG CMPR 75X41 PLY ABS (GAUZE/BANDAGES/DRESSINGS) ×1
BNDG GAUZE ELAST 4 BULKY (GAUZE/BANDAGES/DRESSINGS) ×4 IMPLANT
BNDG STRETCH 4X75 NS LF (GAUZE/BANDAGES/DRESSINGS) ×2 IMPLANT
BUR ACORN 6.0 PRECISION (BURR) IMPLANT
BUR ACORN 6.0MM PRECISION (BURR)
BUR MATCHSTICK NEURO 3.0 LAGG (BURR) IMPLANT
BUR SPIRAL ROUTER 2.3 (BUR) IMPLANT
BUR SPIRAL ROUTER 2.3MM (BUR)
CANISTER SUCT 3000ML PPV (MISCELLANEOUS) ×5 IMPLANT
CARTRIDGE OIL MAESTRO DRILL (MISCELLANEOUS) ×1 IMPLANT
CLIP VESOCCLUDE MED 6/CT (CLIP) IMPLANT
COVER BURR HOLE 20 W/TAB UNI (Plate) ×8 IMPLANT
COVER BURR HOLE UNIV 10 (Orthopedic Implant) ×6 IMPLANT
COVER WAND RF STERILE (DRAPES) ×1 IMPLANT
DIFFUSER DRILL AIR PNEUMATIC (MISCELLANEOUS) ×1 IMPLANT
DRAPE NEUROLOGICAL W/INCISE (DRAPES) ×3 IMPLANT
DRAPE SURG 17X23 STRL (DRAPES) IMPLANT
DRAPE WARM FLUID 44X44 (DRAPES) ×3 IMPLANT
DRSG OPSITE POSTOP 4X6 (GAUZE/BANDAGES/DRESSINGS) ×2 IMPLANT
DRSG TELFA 3X8 NADH (GAUZE/BANDAGES/DRESSINGS) ×3 IMPLANT
DURAPREP 6ML APPLICATOR 50/CS (WOUND CARE) ×3 IMPLANT
ELECT REM PT RETURN 9FT ADLT (ELECTROSURGICAL) ×3
ELECTRODE REM PT RTRN 9FT ADLT (ELECTROSURGICAL) ×1 IMPLANT
GAUZE 4X4 16PLY RFD (DISPOSABLE) ×4 IMPLANT
GAUZE SPONGE 4X4 12PLY STRL (GAUZE/BANDAGES/DRESSINGS) ×1 IMPLANT
GLOVE BIO SURGEON STRL SZ 6.5 (GLOVE) ×4 IMPLANT
GLOVE BIO SURGEON STRL SZ7 (GLOVE) IMPLANT
GLOVE BIO SURGEON STRL SZ7.5 (GLOVE) ×4 IMPLANT
GLOVE BIO SURGEONS STRL SZ 6.5 (GLOVE) ×4
GLOVE BIOGEL PI IND STRL 6.5 (GLOVE) IMPLANT
GLOVE BIOGEL PI IND STRL 7.0 (GLOVE) IMPLANT
GLOVE BIOGEL PI IND STRL 7.5 (GLOVE) ×1 IMPLANT
GLOVE BIOGEL PI INDICATOR 6.5 (GLOVE) ×2
GLOVE BIOGEL PI INDICATOR 7.0 (GLOVE)
GLOVE BIOGEL PI INDICATOR 7.5 (GLOVE) ×6
GLOVE ECLIPSE 7.0 STRL STRAW (GLOVE) ×4 IMPLANT
GLOVE EXAM NITRILE XL STR (GLOVE) IMPLANT
GOWN STRL REUS W/ TWL LRG LVL3 (GOWN DISPOSABLE) ×2 IMPLANT
GOWN STRL REUS W/ TWL XL LVL3 (GOWN DISPOSABLE) IMPLANT
GOWN STRL REUS W/TWL 2XL LVL3 (GOWN DISPOSABLE) IMPLANT
GOWN STRL REUS W/TWL LRG LVL3 (GOWN DISPOSABLE) ×15
GOWN STRL REUS W/TWL XL LVL3 (GOWN DISPOSABLE)
HEMOSTAT POWDER KIT SURGIFOAM (HEMOSTASIS) ×2 IMPLANT
HEMOSTAT SURGICEL 2X14 (HEMOSTASIS) IMPLANT
KIT BASIN OR (CUSTOM PROCEDURE TRAY) ×3 IMPLANT
KIT TURNOVER KIT B (KITS) ×3 IMPLANT
NDL HYPO 25X1 1.5 SAFETY (NEEDLE) ×1 IMPLANT
NEEDLE HYPO 25X1 1.5 SAFETY (NEEDLE) ×3 IMPLANT
NS IRRIG 1000ML POUR BTL (IV SOLUTION) ×5 IMPLANT
OIL CARTRIDGE MAESTRO DRILL (MISCELLANEOUS)
PACK CRANIOTOMY CUSTOM (CUSTOM PROCEDURE TRAY) ×3 IMPLANT
PAD DRESSING TELFA 3X8 NADH (GAUZE/BANDAGES/DRESSINGS) IMPLANT
PATTIES SURGICAL .5 X.5 (GAUZE/BANDAGES/DRESSINGS) IMPLANT
PATTIES SURGICAL .5 X3 (DISPOSABLE) IMPLANT
PATTIES SURGICAL 1X1 (DISPOSABLE) IMPLANT
PLATE CRANIAL 4H UNI NEURO III (Plate) ×2 IMPLANT
SCREW UNIII AXS SD 1.5X4 (Screw) ×38 IMPLANT
SPONGE NEURO XRAY DETECT 1X3 (DISPOSABLE) IMPLANT
SPONGE SURGIFOAM ABS GEL 100 (HEMOSTASIS) ×3 IMPLANT
STAPLER VISISTAT 35W (STAPLE) ×7 IMPLANT
STOCKINETTE 6  STRL (DRAPES)
STOCKINETTE 6 STRL (DRAPES) ×1 IMPLANT
SUT ETHILON 3 0 FSL (SUTURE) IMPLANT
SUT ETHILON 3 0 PS 1 (SUTURE) IMPLANT
SUT NURALON 4 0 TR CR/8 (SUTURE) ×5 IMPLANT
SUT STEEL 0 (SUTURE)
SUT STEEL 0 18XMFL TIE 17 (SUTURE) IMPLANT
SUT VIC AB 0 CT1 18XCR BRD8 (SUTURE) ×2 IMPLANT
SUT VIC AB 0 CT1 8-18 (SUTURE) ×15
SUT VIC AB 3-0 SH 8-18 (SUTURE) ×6 IMPLANT
TOWEL GREEN STERILE (TOWEL DISPOSABLE) ×3 IMPLANT
TOWEL GREEN STERILE FF (TOWEL DISPOSABLE) ×3 IMPLANT
TRAY FOLEY MTR SLVR 16FR STAT (SET/KITS/TRAYS/PACK) ×1 IMPLANT
TUBE CONNECTING 12'X1/4 (SUCTIONS) ×1
TUBE CONNECTING 12X1/4 (SUCTIONS) ×2 IMPLANT
UNDERPAD 30X36 HEAVY ABSORB (UNDERPADS AND DIAPERS) ×1 IMPLANT
WATER STERILE IRR 1000ML POUR (IV SOLUTION) ×5 IMPLANT

## 2020-06-05 NOTE — Transfer of Care (Signed)
Immediate Anesthesia Transfer of Care Note  Patient: Matthew Black  Procedure(s) Performed: CRANIOPLASTY (Right Head)  Patient Location: PACU  Anesthesia Type:General  Level of Consciousness: awake and alert   Airway & Oxygen Therapy: Patient Spontanous Breathing and Patient connected to face mask oxygen  Post-op Assessment: Report given to RN and Post -op Vital signs reviewed and stable  Post vital signs: Reviewed and stable  Last Vitals:  Vitals Value Taken Time  BP    Temp    Pulse    Resp    SpO2      Last Pain:  Vitals:   06/05/20 0643  TempSrc:   PainSc: 0-No pain      Patients Stated Pain Goal: 3 (00/86/76 1950)  Complications: No complications documented.

## 2020-06-05 NOTE — Op Note (Signed)
  NEUROSURGERY OPERATIVE NOTE   PREOP DIAGNOSIS:  Right cranial defect   POSTOP DIAGNOSIS: Same  PROCEDURE: Harvest of abdominal bone flap Right cranioplasty  SURGEON: Dr. Consuella Lose, MD  ASSISTANT: Ferne Reus, PA-C  ANESTHESIA: General Endotracheal  EBL: 25cc  SPECIMENS: None  DRAINS: None  COMPLICATIONS: None immediate  CONDITION: Hemodynamically stable to PACU  HISTORY: Matthew Black is a 44 y.o. male initially presented to the hospital approximately 1 year ago with large right hemispheric stroke.  He developed malignant cerebral edema and required decompressive craniectomy.  He has been followed in the outpatient neurosurgery clinic, and presents today for cranioplasty.  The risks and benefits of the surgery were reviewed in detail with both the patient and his wife.  After all questions were answered informed consent was obtained and witnessed.  PROCEDURE IN DETAIL: The patient was brought to the operating room. After induction of general anesthesia, the patient was positioned on the operative table in the supine position. All pressure points were meticulously padded.  Previous right-sided abdominal skin incision as well as the right scalp incision were all clipped prepped and draped in the usual sterile fashion.  After timeout was conducted, the scalp incision was infiltrated with local anesthetic with epinephrine.  Incision was then opened sharply and Bovie electrocautery was used to dissect through the subcutaneous tissue and the galea.  The edges of the bone were then identified and subperiosteal dissection was carried out.  In this fashion, the edges of the bone flap were identified.  A plane between the galea and the dural onlay graft was then developed, and the skin and temporalis were elevated and reflected anteriorly.  The abdominal incision was then opened sharply after infiltration of local anesthetic.  Subcutaneous tissue was dissected and the bone  flap was identified and removed from the subcutaneous abdominal pocket.  The bone flap was then plated with standard titanium plates and screws.  After hemostasis was secured on the epidural plane with bipolar electrocautery and morselized Gelfoam and thrombin, the bone flap was replaced and secured with standard titanium screws.  Both the abdominal and cranial wounds were then irrigated with copious amount of normal saline irrigation.  The abdominal incision was then closed with interrupted 0 and 3-0 Vicryl stitches.  The galea was reapproximated with interrupted 0 Vicryl stitches in the skin was closed with staples.  At the end of the case all sponge, needle, instrument, and cottonoid counts were correct.  Bacitracin ointment and sterile dressing was then applied.  The patient was then transferred to the stretcher, extubated, and taken to the postanesthesia care unit in stable hemodynamic condition.

## 2020-06-05 NOTE — Anesthesia Postprocedure Evaluation (Signed)
Anesthesia Post Note  Patient: Matthew Black  Procedure(s) Performed: CRANIOPLASTY (Right Head)     Patient location during evaluation: PACU Anesthesia Type: General Level of consciousness: awake and alert, oriented and patient cooperative Pain management: pain level controlled Vital Signs Assessment: post-procedure vital signs reviewed and stable Respiratory status: spontaneous breathing, nonlabored ventilation and respiratory function stable Cardiovascular status: blood pressure returned to baseline and stable Postop Assessment: no apparent nausea or vomiting Anesthetic complications: no   No complications documented.  Last Vitals:  Vitals:   06/05/20 1050 06/05/20 1105  BP: 123/79 122/87  Pulse: 66 71  Resp: 14 16  Temp:  36.4 C  SpO2: 97% 97%    Last Pain:  Vitals:   06/05/20 1105  TempSrc:   PainSc: [place]

## 2020-06-05 NOTE — Anesthesia Procedure Notes (Signed)
Procedure Name: Intubation Date/Time: 06/05/2020 8:11 AM Performed by: Pervis Hocking, DO Pre-anesthesia Checklist: Patient identified, Emergency Drugs available, Suction available and Patient being monitored Patient Re-evaluated:Patient Re-evaluated prior to induction Oxygen Delivery Method: Circle system utilized Preoxygenation: Pre-oxygenation with 100% oxygen Induction Type: IV induction Ventilation: Mask ventilation without difficulty Laryngoscope Size: Mac and 4 Grade View: Grade I Tube type: Oral Tube size: 7.5 mm Number of attempts: 1 Airway Equipment and Method: Stylet and Oral airway Placement Confirmation: ETT inserted through vocal cords under direct vision,  positive ETCO2 and breath sounds checked- equal and bilateral Secured at: 23 cm Tube secured with: Tape Dental Injury: Teeth and Oropharynx as per pre-operative assessment  Comments: Levert Feinstein, SRNA intubated atraumatically

## 2020-06-06 ENCOUNTER — Encounter (HOSPITAL_COMMUNITY): Payer: Self-pay | Admitting: Neurosurgery

## 2020-06-06 LAB — BASIC METABOLIC PANEL
Anion gap: 13 (ref 5–15)
BUN: 9 mg/dL (ref 6–20)
CO2: 22 mmol/L (ref 22–32)
Calcium: 8.9 mg/dL (ref 8.9–10.3)
Chloride: 105 mmol/L (ref 98–111)
Creatinine, Ser: 0.98 mg/dL (ref 0.61–1.24)
GFR calc Af Amer: >60 mL/min (ref >60)
GFR calc non Af Amer: >60 mL/min (ref >60)
Glucose, Bld: 112 mg/dL — ABNORMAL HIGH (ref 70–99)
Potassium: 4.2 mmol/L (ref 3.5–5.1)
Sodium: 140 mmol/L (ref 135–145)

## 2020-06-06 LAB — CBC
HCT: 42.3 % (ref 39.0–52.0)
Hemoglobin: 13.6 g/dL (ref 13.0–17.0)
MCH: 26.8 pg (ref 26.0–34.0)
MCHC: 32.2 g/dL (ref 30.0–36.0)
MCV: 83.3 fL (ref 80.0–100.0)
Platelets: 269 10*3/uL (ref 150–400)
RBC: 5.08 MIL/uL (ref 4.22–5.81)
RDW: 14.6 % (ref 11.5–15.5)
WBC: 16.8 10*3/uL — ABNORMAL HIGH (ref 4.0–10.5)
nRBC: 0 % (ref 0.0–0.2)

## 2020-06-06 LAB — GLUCOSE, CAPILLARY
Glucose-Capillary: 112 mg/dL — ABNORMAL HIGH (ref 70–99)
Glucose-Capillary: 127 mg/dL — ABNORMAL HIGH (ref 70–99)

## 2020-06-06 NOTE — Discharge Summary (Signed)
Physician Discharge Summary  Patient ID: JAIDEV SANGER MRN: [id number] DOB/AGE: May 20, 1976 44 y.o.  Admit date: 06/05/2020 Discharge date: 06/06/2020  Admission Diagnoses:  S/p craniectomy  Discharge Diagnoses:  Same Active Problems:   S/P craniotomy   History of cranioplasty   Discharged Condition: Stable  Hospital Course:  Matthew Black is a 44 y.o. male who was admitted for the below procedure. There were no post operative complications. At time of discharge, pain was well controlled, at baseline, tolerating po, voiding normal. Ready for discharge.  Treatments: Surgery  cranioplasty  Discharge Exam: Blood pressure 111/70, pulse 80, temperature 98.8 F (37.1 C), temperature source Oral, resp. rate 15, height 6' (1.829 m), weight 99.8 kg, SpO2 98 %. Awake, alert, oriented Speech slow but appropriate Left hemiparesis Right normal  Disposition: Discharge disposition: 01-Home or Self Care       Discharge Instructions    Call MD for:  difficulty breathing, headache or visual disturbances   Complete by: As directed    Call MD for:  persistant dizziness or light-headedness   Complete by: As directed    Call MD for:  redness, tenderness, or signs of infection (pain, swelling, redness, odor or green/yellow discharge around incision site)   Complete by: As directed    Call MD for:  severe uncontrolled pain   Complete by: As directed    Call MD for:  temperature >100.4   Complete by: As directed    Diet general   Complete by: As directed    Driving Restrictions   Complete by: As directed    Do not drive until given clearance.   Increase activity slowly   Complete by: As directed    Lifting restrictions   Complete by: As directed    Do not lift anything >10lbs. Avoid bending and twisting in awkward positions. Avoid bending at the back.   May shower / Bathe   Complete by: As directed    In 24 hours. Okay to wash wound with warm soapy water. Avoid scrubbing the  wound. Pat dry.   Remove dressing in 24 hours   Complete by: As directed      Allergies as of 06/06/2020      Reactions   Hydrocodone Nausea Only      Medication List    STOP taking these medications   docusate sodium 100 MG capsule Commonly known as: Colace   doxycycline 100 MG tablet Commonly known as: VIBRA-TABS   famotidine 20 MG tablet Commonly known as: PEPCID   oxyCODONE 5 MG immediate release tablet Commonly known as: Roxicodone     TAKE these medications   acetaminophen 325 MG tablet Commonly known as: TYLENOL Take 650 mg by mouth every 6 (six) hours as needed for moderate pain.   baclofen 10 MG tablet Commonly known as: LIORESAL TAKE 1 TABLET BY MOUTH TWICE A DAY   metoprolol tartrate 50 MG tablet Commonly known as: LOPRESSOR Take 1 tablet (50 mg total) by mouth 2 (two) times daily.   traMADol 50 MG tablet Commonly known as: ULTRAM Take 1 tablet (50 mg total) by mouth 2 (two) times daily as needed.       Follow-up Information    Consuella Lose, MD. Schedule an appointment as soon as possible for a visit in 2 week(s).   Specialty: Neurosurgery Why: staple removal Contact information: [address] [place] [address] [phone]  SignedVista Mink Sarinity Dicicco 06/06/2020, 9:06 AM

## 2020-06-06 NOTE — Evaluation (Signed)
Physical Therapy Evaluation Patient Details Name: Matthew Black MRN: [id number] DOB: December 10, 1975 Today's Date: 06/06/2020   History of Present Illness  44 y.o. 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  Pt underwent cranioplasty on 7/13.  Clinical Impression  Pt presents to PT with deficits in functional mobility,gait, balance, strength, power, cognition, and with significant pain. Pt demonstrates strong R lateral lean during standing activities and transfers requiring PT assistance to correct and prevent losses of balance. Pt is able to transfer with modA at this time, although this was after attempting to take some steps and toward the pt's weaker side. PT provides education on recommendations for activity progression upon return home to pt and spouse. PT recommending stand pivot transfers toward strong side from bed-wheelchair-bedside commode initially, with gradual progression to ambulation with outpatient PT. PT recommending outpatient PT as this patient is established with outpatient PT and has a good relationship with his therapists. Pt has the necessary DME and assistance at this time.    Follow Up Recommendations Outpatient PT;Supervision for mobility/OOB    Equipment Recommendations  None recommended by PT (pt owns necessary DME)    Recommendations for Other Services       Precautions / Restrictions Precautions Precautions: Fall Required Braces or Orthoses: Other Brace Other Brace: pt utilizies L AFO Restrictions Weight Bearing Restrictions: No      Mobility  Bed Mobility               General bed mobility comments: pt received sitting at edge of bed, left sitting in wheelchair  Transfers Overall transfer level: Needs assistance Equipment used: Quad cane Transfers: Sit to/from [facility] Sit to Stand: Min assist Stand pivot transfers:  Mod assist       General transfer comment: pt requires minA to power up into standing, modA during transfer due to strong R sided lean to limit WB on LLE. PT physically assisting to prevent loss of balance to right side due to significant lean  Ambulation/Gait Ambulation/Gait assistance: Mod assist Gait Distance (Feet): 3 Feet Assistive device: Quad cane Gait Pattern/deviations: Step-to pattern;Decreased stance time - left Gait velocity: reduced Gait velocity interpretation: <1.31 ft/sec, indicative of household ambulator General Gait Details: pt with short step to gait with significantly reduced WB time on LLE and strong R lateral lean which PT provides support for to reduce falls risk  Stairs            Wheelchair Mobility    Modified Rankin (Stroke Patients Only) Modified Rankin (Stroke Patients Only) Pre-Morbid Rankin Score: Moderately severe disability Modified Rankin: Moderately severe disability     Balance Overall balance assessment: Needs assistance Sitting-balance support: No upper extremity supported;Feet supported Sitting balance-Leahy Scale: Fair     Standing balance support: Single extremity supported Standing balance-Leahy Scale: Poor Standing balance comment: modA due to R lateral lean                             Pertinent Vitals/Pain Pain Assessment: Faces Pain Score: 6  Faces Pain Scale: Hurts whole lot Pain Location: head, stomach Pain Descriptors / Indicators: Headache Pain Intervention(s): Monitored during session    Home Living Family/patient expects to be discharged to:: Private residence Living Arrangements: Spouse/significant other;Children Available Help at Discharge: Family;Available 24 hours/day Type of Home: House Home Access: Stairs to enter;Ramped entrance  Home Layout: Two level;Bed/bath upstairs;1/2 bath on main level (hospital bed on main level) Home Equipment: Walker - 2 wheels;Wheelchair - manual;Hospital  bed Additional Comments: bed now on main level     Prior Function Level of Independence: Needs assistance   Gait / Transfers Assistance Needed: able to complete transfer to the w/c without (A). Working with outpatient PT, ambulating >100' with minG and large based quad cane  ADL's / Homemaking Assistance Needed: wife reports patient dress LB but has to help him with socks on occassion        Hand Dominance   Dominant Hand: Right    Extremity/Trunk Assessment   Upper Extremity Assessment Upper Extremity Assessment: Defer to OT evaluation LUE Deficits / Details: wears a splint for night time only    Lower Extremity Assessment Lower Extremity Assessment: LLE deficits/detail LLE Deficits / Details: generalized weakness, pt in AFO throughout session dressed to discharge. Pt without knee buckling during WB on L side, however without significant weight shift toward L side during mobility. Formal strength assessment not performed    Cervical / Trunk Assessment Cervical / Trunk Assessment: Normal  Communication   Communication: Expressive difficulties (slowed speech)  Cognition Arousal/Alertness: Awake/alert Behavior During Therapy: Flat affect Overall Cognitive Status: History of cognitive impairments - at baseline                                 General Comments: pt with slowed processing, otherwise follows commands and appears aware of deficits at this time      General Comments General comments (skin integrity, edema, etc.): VSS on RA    Exercises     Assessment/Plan    PT Assessment Patient needs continued PT services  PT Problem List Decreased strength;Decreased activity tolerance;Decreased balance;Decreased mobility;Decreased knowledge of use of DME;Decreased safety awareness;Decreased cognition;Decreased knowledge of precautions;Impaired tone;Pain       PT Treatment Interventions DME instruction;Gait training;Functional mobility training;Therapeutic  activities;Therapeutic exercise;Balance training;Neuromuscular re-education;Cognitive remediation;Wheelchair mobility training;Patient/family education    PT Goals (Current goals can be found in the Care Plan section)  Acute Rehab PT Goals Patient Stated Goal: To go home and reduce pain PT Goal Formulation: With patient/family Time For Goal Achievement: 06/20/20 Potential to Achieve Goals: Good    Frequency Min 4X/week   Barriers to discharge        Co-evaluation               AM-PAC PT "6 Clicks" Mobility  Outcome Measure Help needed turning from your back to your side while in a flat bed without using bedrails?: A Lot Help needed moving from lying on your back to sitting on the side of a flat bed without using bedrails?: A Lot Help needed moving to and from a bed to a chair (including a wheelchair)?: A Lot Help needed standing up from a chair using your arms (e.g., wheelchair or bedside chair)?: A Little Help needed to walk in hospital room?: A Lot Help needed climbing 3-5 steps with a railing? : Total 6 Click Score: 12    End of Session Equipment Utilized During Treatment: Other (comment) (L AFO) Activity Tolerance: Patient limited by pain Patient left: Other (comment);with family/visitor present (in wheelchair) Nurse Communication: Mobility status PT Visit Diagnosis: Other abnormalities of gait and mobility (R26.89);Unsteadiness on feet (R26.81);Muscle weakness (generalized) (M62.81);Difficulty in walking, not elsewhere classified (R26.2);Other symptoms and signs involving the nervous system (R29.898);Hemiplegia and hemiparesis Hemiplegia - Right/Left:  Left Hemiplegia - dominant/non-dominant: Non-dominant Hemiplegia - caused by: Cerebral infarction    Time: 5831-6742 PT Time Calculation (min) (ACUTE ONLY): 19 min   Charges:   PT Evaluation $PT Eval Moderate Complexity: 1 Mod          Zenaida Niece, PT, DPT Acute Rehabilitation Pager: [phone]   Zenaida Niece 06/06/2020, 12:47 PM

## 2020-06-06 NOTE — Progress Notes (Signed)
  NEUROSURGERY PROGRESS NOTE   No issues overnight. Complains of some headache and nausea w/ vomiting overnight, now improved.  EXAM:  BP 111/70   Pulse 80   Temp 98.8 F (37.1 C) (Oral)   Resp 15   Ht 6' (1.829 m)   Wt 99.8 kg   SpO2 98%   BMI 29.84 kg/m   Awake, alert, oriented  Speech slow but appropriate Stable left hemiparesis RUE/RLE normal  IMPRESSION/PLAN 44 y.o. male POD1 cranioplasty. Doing well. - d/c home

## 2020-06-06 NOTE — Progress Notes (Signed)
OT EVALUATION  Patient is s/p replace bone flap from R abdomen surgery resulting in functional limitations due to the deficits listed below (see OT problem list). Pt currently limited by pain reporting increased pain with static sitting. Pt requires increased (A )with basic transfers.  Patient will benefit from skilled OT acutely to increase independence and safety with ADLS to allow discharge outpatient OT.   06/06/20 1600  OT Visit Information  Last OT Received On 06/06/20  Assistance Needed +1 (chair follow for ambulation)  History of Present Illness 44 y.o. 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  Pt underwent cranioplasty on 7/13.  Precautions  Precautions Fall  Required Braces or Orthoses Other Brace  Other Brace pt utilizies L AFO  Home Living  Family/patient expects to be discharged to: Private residence  Living Arrangements Spouse/significant other;Children  Available Help at Discharge Family;Available 24 hours/day  Type of Home House  Home Access Stairs to enter;Ramped entrance  Home Layout Two level;Bed/bath upstairs;1/2 bath on main level (hospital bed on main level)  Alternate Level Stairs-Number of Steps flight  Bathroom Shower/Tub Walk-in Cytogeneticist Yes  [place] - 2 wheels;Wheelchair - manual;Hospital bed  Additional Comments bed now on main level   Prior Function  Level of Independence Needs assistance  Gait / Transfers Assistance Needed able to complete transfer to the w/c without (A). Working with outpatient PT, ambulating >100' with minG and large based quad cane  ADL's / Homemaking Assistance Needed wife reports patient dress LB but has to help him with socks on [employer]  Communication Expressive difficulties (slowed speech)  Pain Assessment  Pain Assessment 0-10  Pain  Score 6  Pain Location head, stomach  Pain Descriptors / Indicators Headache  Pain Intervention(s) Monitored during session;Premedicated before session;Repositioned  Cognition  Arousal/Alertness Awake/alert  Behavior During Therapy Flat affect  Overall Cognitive Status History of cognitive impairments - at baseline  General Comments pt with slowed processing, otherwise follows commands and appears aware of deficits at this time  Upper Extremity Assessment  Upper Extremity Assessment LUE deficits/detail  LUE Deficits / Details wears a splint for night time only able to activate shoulder and elbow but not digits  Lower Extremity Assessment  Lower Extremity Assessment Defer to PT evaluation  Cervical / Trunk Assessment  Cervical / Trunk Assessment Normal  ADL  Overall ADL's  Needs assistance/impaired  Upper Body Dressing  Minimal assistance;Sitting  Lower Body Dressing Moderate assistance;Sit to/from stand  Lower Body Dressing Details (indicate cue type and reason) pt requires (A) to thread bil feet, pt with deficits figure 4 crossing L LE at this time. pt requires steady (A) for static standing to dress  General ADL Comments wife reports increased unsteadiness with adls compared to baseline  Vision- History  Baseline Vision/History No visual deficits  Bed Mobility  Overal bed mobility Needs Assistance  Bed Mobility Supine to Sit  Supine to sit Min assist  General bed mobility comments pt reports pain in abdomen limiting progress to eob. pt initiated but unable to elevate from bed surface  Transfers  General transfer comment observed PT completed transfer and wife reports changes from baseline  Balance  Overall balance assessment Needs assistance  Sitting-balance support No upper extremity supported;Feet supported  Sitting balance-Leahy Scale Fair  Standing balance support Single extremity supported  Standing balance-Leahy Scale Poor  Standing balance comment modA due to R lateral  lean  General Comments  General comments (skin integrity, edema, etc.) VSS  OT - End of Session  Activity Tolerance Patient limited by pain  Patient left in bed;with call bell/phone within reach;with family/visitor present;Other (comment) (PT arriving to start session)  Nurse Communication Mobility status;Precautions  OT Assessment  OT Recommendation/Assessment Patient needs continued OT Services  OT Visit Diagnosis Unsteadiness on feet (R26.81);Muscle weakness (generalized) (M62.81);Pain  OT Problem List Decreased strength;Decreased activity tolerance;Impaired balance (sitting and/or standing);Decreased safety awareness;Decreased knowledge of use of DME or AE;Decreased knowledge of precautions;Pain  OT Plan  OT Frequency (ACUTE ONLY) Min 2X/week  OT Treatment/Interventions (ACUTE ONLY) Self-care/ADL training;Therapeutic exercise;DME and/or AE instruction;Therapeutic activities;Cognitive remediation/compensation;Patient/family education;Balance training  AM-PAC OT "6 Clicks" Daily Activity Outcome Measure (Version 2)  Help from another person eating meals? 3  Help from another person taking care of personal grooming? 3  Help from another person toileting, which includes using toliet, bedpan, or urinal? 2  Help from another person bathing (including washing, rinsing, drying)? 2  Help from another person to put on and taking off regular upper body clothing? 3  Help from another person to put on and taking off regular lower body clothing? 2  6 Click Score 15  OT Recommendation  Follow Up Recommendations Outpatient OT  OT Equipment None recommended by OT  Individuals Consulted  Consulted and Agree with Results and Recommendations Patient;Family member/caregiver  Family Member Consulted wife  Acute Rehab OT Goals  Patient Stated Goal To go home and reduce pain  OT Time Calculation  OT Start Time (ACUTE ONLY) 1050  OT Stop Time (ACUTE ONLY) 1115  OT Time Calculation (min) 25 min  OT  General Charges  $OT Visit 1 Visit  OT Evaluation  $OT Eval Moderate Complexity 1 Mod  OT Treatments  $Self Care/Home Management  8-22 mins  Written Expression  Dominant Hand Right    Brynn, OTR/L  Acute Rehabilitation Services Pager: [phone] Office: [phone] .

## 2020-06-07 ENCOUNTER — Telehealth: Payer: Self-pay

## 2020-06-07 NOTE — Telephone Encounter (Signed)
Transition Care Management Follow-up Telephone Call Date of discharge and from where: 06/06/2020, [facility] placed to patient # [phone], message left with call back requested to this CM # [phone], # [phone], voicemail full, unable to leave a message  Need to discuss if patient wants to schedule follow up appointment with Dr Juleen China, who is listed as his PCP

## 2020-06-07 NOTE — Telephone Encounter (Signed)
Pt's wife called, pt has confirmed that he wants to be seen with [facility] instead of the pcp listed. Please advise Best contact: [phone]

## 2020-06-08 ENCOUNTER — Telehealth: Payer: Self-pay

## 2020-06-08 NOTE — Progress Notes (Signed)
OT EVALUATION  The original evaluation populated into the patient chart 06/01/20 due to documentation being filed after patient discharged. This note is being repopulated to the correct date/ time of service.  Recommendation for Outpatient OT services.    06/08/20 1200  OT Visit Information  Last OT Received On 06/06/20  Assistance Needed +1 (chair follow for ambulation)  History of Present Illness 44 y.o. 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  Pt underwent cranioplasty on 7/13.  Precautions  Precautions Fall  Required Braces or Orthoses Other Brace  Other Brace pt utilizies L AFO  Home Living  Family/patient expects to be discharged to: Private residence  Living Arrangements Spouse/significant other;Children  Available Help at Discharge Family;Available 24 hours/day  Type of Home House  Home Access Stairs to enter;Ramped entrance  Home Layout Two level;Bed/bath upstairs;1/2 bath on main level (hospital bed on main level)  Alternate Level Stairs-Number of Steps flight  Bathroom Shower/Tub Walk-in Cytogeneticist Yes  [place] - 2 wheels;Wheelchair - manual;Hospital bed  Additional Comments bed now on main level   Prior Function  Level of Independence Needs assistance  Gait / Transfers Assistance Needed able to complete transfer to the w/c without (A). Working with outpatient PT, ambulating >100' with minG and large based quad cane  ADL's / Homemaking Assistance Needed wife reports patient dress LB but has to help him with socks on [employer]  Communication Expressive difficulties (slowed speech)  Pain Assessment  Pain Location head, stomach  Pain Descriptors / Indicators Headache  Cognition  Arousal/Alertness Awake/alert  Behavior During Therapy Flat affect  Overall Cognitive Status History of  cognitive impairments - at baseline  General Comments pt with slowed processing, otherwise follows commands and appears aware of deficits at this time  Upper Extremity Assessment  LUE Deficits / Details wears a splint for night time only able to activate shoulder and elbow but not digits  Cervical / Trunk Assessment  Cervical / Trunk Assessment Normal  ADL  Overall ADL's  Needs assistance/impaired  Upper Body Dressing  Minimal assistance;Sitting  Lower Body Dressing Moderate assistance;Sit to/from stand  Lower Body Dressing Details (indicate cue type and reason) pt requires (A) to thread bil feet, pt with deficits figure 4 crossing L LE at this time. pt requires steady (A) for static standing to dress  General ADL Comments wife reports increased unsteadiness with adls compared to baseline  Vision- History  Baseline Vision/History No visual deficits  Bed Mobility  Overal bed mobility Needs Assistance  Bed Mobility Supine to Sit  Supine to sit Min assist  General bed mobility comments pt reports pain in abdomen limiting progress to eob. pt initiated but unable to elevate from bed surface  Transfers  General transfer comment observed PT completed transfer and wife reports changes from baseline  Balance  Overall balance assessment Needs assistance  Sitting-balance support No upper extremity supported;Feet supported  Sitting balance-Leahy Scale Fair  Standing balance support Single extremity supported  Standing balance-Leahy Scale Poor  Standing balance comment modA due to R lateral lean  OT - End of Session  Activity Tolerance Patient limited by pain  Patient left in bed;with call bell/phone within reach;with family/visitor present;Other (comment) (PT arriving to start session)  Nurse Communication Mobility status;Precautions  OT Assessment  OT Recommendation/Assessment Patient needs continued OT Services  OT Visit Diagnosis Unsteadiness  on feet (R26.81);Muscle weakness (generalized)  (M62.81);Pain  OT Problem List Decreased strength;Decreased activity tolerance;Impaired balance (sitting and/or standing);Decreased safety awareness;Decreased knowledge of use of DME or AE;Decreased knowledge of precautions;Pain  OT Plan  OT Frequency (ACUTE ONLY) Min 2X/week  OT Treatment/Interventions (ACUTE ONLY) Self-care/ADL training;Therapeutic exercise;DME and/or AE instruction;Therapeutic activities;Cognitive remediation/compensation;Patient/family education;Balance training  AM-PAC OT "6 Clicks" Daily Activity Outcome Measure (Version 2)  Help from another person eating meals? 3  Help from another person taking care of personal grooming? 3  Help from another person toileting, which includes using toliet, bedpan, or urinal? 2  Help from another person bathing (including washing, rinsing, drying)? 2  Help from another person to put on and taking off regular upper body clothing? 3  Help from another person to put on and taking off regular lower body clothing? 2  6 Click Score 15  OT Recommendation  Follow Up Recommendations Outpatient OT  OT Equipment None recommended by OT  Individuals Consulted  Consulted and Agree with Results and Recommendations Patient;Family member/caregiver  Acute Rehab OT Goals  Patient Stated Goal To go home and reduce pain  Written Expression  Dominant Hand Right    Brynn, OTR/L  Acute Rehabilitation Services Pager: [phone] Office: [phone] .

## 2020-06-08 NOTE — Telephone Encounter (Signed)
This encounter was created in error - please disregard.

## 2020-06-08 NOTE — Telephone Encounter (Signed)
Transition Care Management Follow-up Telephone Call Date of discharge and from where: 06/06/2020, [facility] How have you been since you were released from the hospital? Feeling better  Any questions or concerns? Pt wife would like to know when he could resume Rehab ?  Please advice! Items Reviewed: Did the pt receive and understand the discharge instructions provided? YES Medications obtained and verified? YES  Any new allergies since your discharge? NONE Dietary orders reviewed? Yes  Do you have support at home? Wife.   Functional Questionnaire: (I = Independent and D = Dependent) ADLs: Mostly independent need assistance with mobility wife helps with walking / Pt is  using quad-cane, walker and wheelchair at times  Follow up appointments reviewed:  PCP Hospital f/u appt confirmed?  Scheduled to see Dr Juleen China on 07/26//2021 at [facility] f/u appt confirmed?not yet pt wife will call today   Are transportation arrangements needed? NO  If their condition worsens, /is the pt aware to call PCP or go to the Emergency Dept.?  Pt is aware if condition is worsening or start experiencing any of diff breathing, SOB, chest pain, extreme fatigue, Persistent nausea and vomiting, bleeding , rapid weight gain, severe uncontrolled pain, or visual disturbances to return or any signs of infection at the incisions site  to go ED  Was the patient provided with contact information for the PCP's office or ED? YES given.  Was to pt encouraged to call back with questions or concerns?YES name and contact information .  No DME Pt owns necessary DME equipment  Made pt and wife aware of hospital discharge instructions with regards to infection prevention and what to watch for, S&S of infection, activity and diet. Instructed both pt and wife to reach out surgeon office or PCP if any additional questions or concerns.   Verbalized understanding

## 2020-06-12 ENCOUNTER — Ambulatory Visit: Payer: Medicaid Other

## 2020-06-18 ENCOUNTER — Telehealth: Payer: Medicaid Other | Admitting: Internal Medicine

## 2020-07-20 ENCOUNTER — Telehealth: Payer: Self-pay | Admitting: *Deleted

## 2020-07-20 NOTE — Telephone Encounter (Signed)
Left message with name and number but did not leave reason for call.

## 2020-07-25 ENCOUNTER — Ambulatory Visit: Payer: Medicaid Other | Attending: Physician Assistant

## 2020-07-25 ENCOUNTER — Other Ambulatory Visit: Payer: Self-pay

## 2020-07-25 VITALS — BP 118/78

## 2020-07-25 DIAGNOSIS — R2681 Unsteadiness on feet: Secondary | ICD-10-CM | POA: Diagnosis not present

## 2020-07-25 DIAGNOSIS — I69354 Hemiplegia and hemiparesis following cerebral infarction affecting left non-dominant side: Secondary | ICD-10-CM

## 2020-07-25 DIAGNOSIS — R2689 Other abnormalities of gait and mobility: Secondary | ICD-10-CM | POA: Insufficient documentation

## 2020-07-25 DIAGNOSIS — M6281 Muscle weakness (generalized): Secondary | ICD-10-CM | POA: Diagnosis not present

## 2020-07-25 NOTE — Therapy (Signed)
[facility] [address] [place], [place], [address] Phone: [phone]   Fax:  [phone]  Physical Therapy Treatment/Re-evaluation  Patient Details  Name: Matthew Black MRN: [id number] Date of Birth: 1976/04/28 Referring Provider (PT): Dr. Naaman Plummer but f/u with Dr. Ranell Patrick   Encounter Date: 07/25/2020   PT End of Session - 07/25/20 1237    Visit Number 15    Number of Visits 23    Authorization Type Medicaid 3 visits approved 12/30/19-01/19/20; Medicaid extension approved for 12 PT visits: 02/02/20-03/28/20, 4 visits 6/8 to 8/2    Authorization - Visit Number --   pt's initial MCD auth for 3 visits met on 01/19/20, new auth for 12 visits (02/02/20-03/28/20   PT Start Time 1233    PT Stop Time 1311    PT Time Calculation (min) 38 min    Equipment Utilized During Treatment Gait belt    Activity Tolerance Patient tolerated treatment well    Behavior During Therapy WFL for tasks assessed/performed           Past Medical History:  Diagnosis Date  . 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 Depression   . DVT (deep venous thrombosis) ([place])    BLE DVT 07/21/19, 08/02/19; s/p retrievable IVC filter 07/21/19  . Hemorrhagic stroke ([place])   . History of kidney stones   . Hypertension   . 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 PE (pulmonary thromboembolism) ([place])    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  . Stroke [facility])    RICA, R A1, R MCA occlusion 07/19/19    Past Surgical History:  Procedure Laterality Date  . CRANIOPLASTY Right 06/05/2020   Procedure: CRANIOPLASTY;  Surgeon: Consuella Lose, MD;  Location: [facility];  Service: Neurosurgery;  Laterality: Right;  right  . CRANIOTOMY Right 07/19/2019   Procedure: RIGHT HEMI-CRANIECTOMY With implantation of skull flap to abdominal wall;  Surgeon: Consuella Lose, MD;  Location: [facility];  Service: Neurosurgery;  Laterality: Right;  . CYST EXCISION N/A 10/08/2016    Procedure: EXCISION OF POSTERIOR NECK CYST;  Surgeon: Clovis Riley, MD;  Location: [facility];  Service: General;  Laterality: N/A;  . CYSTOSCOPY/URETEROSCOPY/HOLMIUM LASER/STENT PLACEMENT Right 03/16/2020   Procedure: CYSTOSCOPY RIGHT RETROGRADE PYELOGRAM URETEROSCOPY/HOLMIUM LASER/STENT PLACEMENT;  Surgeon: Lucas Mallow, MD;  Location: [facility];  Service: Urology;  Laterality: Right;  . INCISION AND DRAINAGE ABSCESS N/A 09/22/2014   Procedure: INCISION AND DRAINAGE ABSCESS POSTERIOR NECK;  Surgeon: Pedro Earls, MD;  Location: [facility];  Service: General;  Laterality: N/A;  . INCISION AND DRAINAGE ABSCESS N/A 12/20/2015   Procedure: INCISION AND DRAINAGE POSTERIOR NECK MASS;  Surgeon: Armandina Gemma, MD;  Location: [facility];  Service: General;  Laterality: N/A;  . INCISION AND DRAINAGE ABSCESS Left 07/10/2004   middle finger  . IR IVC FILTER PLMT / S&I /IMG GUID/MOD SED  07/21/2019  . IR RADIOLOGIST EVAL & MGMT  12/14/2019  . IR VENOGRAM RENAL UNI RIGHT  07/21/2019  . MASS EXCISION N/A 07/21/2017   Procedure: EXCISION OF BENIGN NECK LESION WITH LAYERED CLOSURE;  Surgeon: Irene Limbo, MD;  Location: [facility];  Service: Plastics;  Laterality: N/A;  . MASS EXCISION N/A 11/10/2017   Procedure: EXCISION BENIGN LESION OF THE NECK WITH LAYERED CLOSURE;  Surgeon: Irene Limbo, MD;  Location: [facility];  Service: Plastics;  Laterality: N/A;    Vitals:   07/25/20 1249  BP: 118/78  Subjective Assessment - 07/25/20 1234    Subjective Pt had cranioplasty on 7/13 and returned home on 7/14. Now returning to PT with resumption orders. Pt reports no headaches since surgery. Denies any new issues since surgery. Has been working on standing at [employer].    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 No/denies    Pain Onset More than a month ago              [facility] PT Assessment - 07/25/20 1235      Assessment   Medical Diagnosis R MCA/ACA CVA    Referring Provider (PT) Dr. Naaman Plummer but f/u with Dr. Ranell Patrick    Onset Date/Surgical Date 07/19/19      ROM / Strength   AROM / PROM / Strength Strength      AROM   AROM Assessment Site --    Right/Left Hip --      Strength   Strength Assessment Site Hip;Knee;Ankle    Right/Left Hip Right;Left    Right Hip Flexion 4+/5    Left Hip Flexion 2-/5    Left Hip ABduction 2-/5    Right/Left Knee Right;Left    Right Knee Flexion 5/5    Right Knee Extension 5/5    Left Knee Flexion 2-/5    Left Knee Extension 2-/5    Right/Left Ankle Right;Left    Right Ankle Dorsiflexion 5/5    Left Ankle Dorsiflexion 2-/5      Bed Mobility   Bed Mobility Supine to Sit;Sit to Supine;Rolling Right    Rolling Right Contact Guard/Touching assist    Supine to Sit Supervision/Verbal cueing   verbal cues to bring left leg off   Sit to Supine Supervision/Verbal cueing      Ambulation/Gait   Ambulation/Gait Yes                         [facility] Adult PT Treatment/Exercise - 07/25/20 1235      Transfers   Transfers Sit to Stand;Stand to [employer] Transfers    Sit to Stand 5: Supervision;4: Min guard    Sit to Stand Details Verbal cues for technique    Sit to Stand Details (indicate cue type and reason) Pt has increased right weight shift with rising     Five time sit to stand comments  28.46 sec from mat with hands    Stand to Sit 5: Supervision;4: Min guard    Stand Pivot Transfers 4: Min guard    Stand Pivot Transfer Details (indicate cue type and reason) w/c to mat      Ambulation/Gait   Ambulation/Gait Assistance 4: Min assist;3: Mod assist    Ambulation Distance (Feet) 100 Feet    Assistive device Large base quad cane   left AFO   Gait Pattern Step-to pattern;Decreased hip/knee flexion -  left;Decreased step length - left;Decreased step length - right;Decreased stance time - left;Decreased weight shift to left    Ambulation Surface Level;Indoor    Gait velocity 42.31 sec in 20' so 0.49ms                  PT Education - 07/25/20 2126    Education Details Discussed recert plan pending insurance auth.    Person(s) Educated Patient    Methods Explanation    Comprehension Verbalized understanding            PT Short Term Goals - 07/25/20 2130      PT SHORT TERM GOAL #1   Title Pt will be supervision with all transfers for improved mobility.    Baseline supervision/CGA    Time 4    Period Weeks    Status New    Target Date 08/24/20      PT SHORT TERM GOAL #2   Title Pt will decrease 5 x sit to stand from 28.46 sec to <24 sec for improved functional strength and balance.    Baseline 28.46 sec from mat with hand on 07/25/20    Time 4    Period Weeks    Status New    Target Date 08/24/20      PT SHORT TERM GOAL #3   Title Pt will ambulate >150' with large based quad cane min assist for improved mobility.    Baseline 100' with large based quad cane min assist/mod assist    Time 4    Period Weeks    Status New    Target Date 08/24/20             PT Long Term Goals - 07/25/20 2132      PT LONG TERM GOAL #1   Title Pt will increase gait speed from 0.156m to >0.2491mfor improved gait safety.    Baseline 0.69m16mn 07/25/20    Time 8    Period Weeks    Status New    Target Date 09/23/20      PT LONG TERM  GOAL #2   Title Pt will be able to demonstrate increased left weight shift to >40% of weight during sit to stand.    Baseline currently <20%    Time 8    Period Weeks    Status New    Target Date 09/23/20      PT LONG TERM GOAL #3   Title Pt will ambulate >200' with LRAD CGA for improved mobility.    Baseline 100' with large based quad cane min/mod assist.    Time 8    Period Weeks    Status On-going    Target Date 09/23/20                 Plan - 07/25/20 2138    Clinical Impression Statement Pt returns to therapy after cranioplasty. Reporting no headaches since surgery. PT reassessed pt and he is not showing any declines since last seen. Pt still has left hemiplegia with decreased left weight shift. Not ambulating at home but was able to walk 100' with large based quad cane min/mod assist. Pt has slow gait speed of 0.69m/46mdicating decreased safety for household ambulation. 5x sit to stand of 28.46 sec indicates high fall risk. Pt will benefit from continued Pt to continue to work on strengthening, balance and gait progression.    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 08/20/19 w aspiration 08/21/19, (tx empircally w vanco/ cefepime), LLE DVT 07/21/19 and small left lower lung PE 08/01/19 s/p IVC filter 07/21/19, iv heparin discontinued due to worsening ICH 08/14/19, and abdominal wall hematoma, seizure, pseudomonas uti,    Examination-Activity Limitations Bathing;Sit;Bed Mobility;Bend;Squat;Caring for Others;Stairs;Stand;Carry;Toileting;Dressing;Transfers;Hygiene/Grooming;Locomotion Level    Examination-Participation Restrictions Driving;Other;Meal Prep    Rehab Potential Good    PT Frequency 1x  / week   plus re-eval   PT Duration 8 weeks    PT Treatment/Interventions ADLs/Self Care Home Management;Aquatic Therapy;Biofeedback;Balance training;Therapeutic exercise;Therapeutic activities;Functional mobility training;Wheelchair mobility training;Orthotic Fit/Training;Stair training;Gait training;DME Instruction;Patient/family education;Cognitive remediation;Neuromuscular re-education;Vestibular;Manual techniques;Passive range of motion;Cryotherapy;Moist Heat    PT Next Visit Plan Continue with gait training with [facility]. Activities to promote increased left weight shift.    Consulted and Agree with Plan of Care Patient           Patient will benefit from skilled therapeutic intervention in order to improve the following deficits and impairments:  Abnormal gait, Difficulty walking, Decreased mobility, Decreased balance, Decreased cognition, Decreased range of motion, Decreased coordination, Decreased safety awareness, Impaired flexibility, Postural dysfunction, Decreased knowledge of use of DME, Decreased strength, Impaired UE functional use, Pain, Impaired tone, Impaired sensation, Decreased endurance  Visit Diagnosis: Other abnormalities of gait and mobility  Muscle weakness (generalized)  Hemiplegia and hemiparesis following cerebral infarction affecting left non-dominant side (HCC)  Unsteadiness on feet     Problem List Patient Active Problem List   Diagnosis Date Noted  . S/P craniotomy 06/05/2020  . History of cranioplasty 06/05/2020  . Peri-rectal abscess 02/14/2020  . Abnormal CT scan, pelvis 02/14/2020  . Pancytopenia ([place]) 12/02/2019  . Nephrolithiasis 12/02/2019  . Hydronephrosis with renal and ureteral calculus obstruction 12/02/2019  . Rectal pain 12/02/2019  . Hyperkalemia 12/02/2019  . Reactive depression   . Wound infection after surgery   . Sleep disturbance   . Dysphagia, post-stroke   . Transaminitis   . 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 Cerebral abscess   . Urinary tract infection without hematuria   . Altered mental status   . Primary hypercoagulable state ([place])   . Acute pulmonary embolism without acute cor pulmonale (HCC)   . Deep vein thrombosis (DVT) of non-extremity vein   . Hypokalemia   . Acute blood loss anemia   . Leukocytosis   . Endotracheal tube present   . Acute respiratory failure with hypoxemia ([place])   . Stroke (cerebrum) ([place]) 07/19/2019  . Pressure injury of skin 07/19/2019  . Acute CVA (cerebrovascular accident) ([place])   . Encephalopathy   . Dysphagia   . Acute encephalopathy   . Essential hypertension   . Obesity 03/12/2016  . Scalp abscess 12/20/2015  . Neck abscess 12/20/2015  . Pilonidal cyst 02/08/2013    Electa Sniff, PT, DPT, NCS 07/25/2020, 9:46 PM  [facility] [address] [place], [place], [address] Phone: [phone]   Fax:  [phone]  Name: Matthew Black MRN: [id number] Date of Birth: [birth year]

## 2020-08-01 ENCOUNTER — Ambulatory Visit: Payer: Medicaid Other | Admitting: Physical Therapy

## 2020-08-01 ENCOUNTER — Other Ambulatory Visit: Payer: Self-pay

## 2020-08-01 ENCOUNTER — Encounter: Payer: Self-pay | Admitting: Physical Therapy

## 2020-08-01 VITALS — BP 102/71 | HR 61

## 2020-08-01 DIAGNOSIS — I69354 Hemiplegia and hemiparesis following cerebral infarction affecting left non-dominant side: Secondary | ICD-10-CM | POA: Diagnosis not present

## 2020-08-01 DIAGNOSIS — R2681 Unsteadiness on feet: Secondary | ICD-10-CM

## 2020-08-01 DIAGNOSIS — M6281 Muscle weakness (generalized): Secondary | ICD-10-CM

## 2020-08-01 DIAGNOSIS — R2689 Other abnormalities of gait and mobility: Secondary | ICD-10-CM

## 2020-08-01 NOTE — Therapy (Signed)
[facility] [address] [place], [place], [address] Phone: [phone]   Fax:  [phone]  Physical Therapy Treatment  Patient Details  Name: Matthew Black MRN: [id number] Date of Birth: [birth year] Referring Provider (PT): Dr. Naaman Plummer but f/u with Dr. Ranell Patrick   Encounter Date: 08/01/2020   PT End of Session - 08/01/20 1608    Visit Number 16    Number of Visits 23    Authorization Type Awaiting approval of 8 visits    Authorization - Visit Number --   pt's initial MCD auth for 3 visits met on 01/19/20, new auth for 12 visits (02/02/20-03/28/20   PT Start Time 1400    PT Stop Time 1445    PT Time Calculation (min) 45 min    Activity Tolerance Patient tolerated treatment well    Behavior During Therapy [facility] for tasks assessed/performed           Past Medical History:  Diagnosis Date  . 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 Depression   . DVT (deep venous thrombosis) ([place])    BLE DVT 07/21/19, 08/02/19; s/p retrievable IVC filter 07/21/19  . Hemorrhagic stroke ([place])   . History of kidney stones   . Hypertension   . 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 PE (pulmonary thromboembolism) ([place])    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  . Stroke [facility])    RICA, R A1, R MCA occlusion 07/19/19    Past Surgical History:  Procedure Laterality Date  . CRANIOPLASTY Right 06/05/2020   Procedure: CRANIOPLASTY;  Surgeon: Consuella Lose, MD;  Location: [place];  Service: Neurosurgery;  Laterality: Right;  right  . CRANIOTOMY Right 07/19/2019   Procedure: RIGHT HEMI-CRANIECTOMY With implantation of skull flap to abdominal wall;  Surgeon: Consuella Lose, MD;  Location: [place];  Service: Neurosurgery;  Laterality: Right;  . CYST EXCISION N/A 10/08/2016   Procedure: EXCISION OF POSTERIOR NECK CYST;  Surgeon: Clovis Riley, MD;  [facility];  Service: General;  Laterality: N/A;  .  CYSTOSCOPY/URETEROSCOPY/HOLMIUM LASER/STENT PLACEMENT Right 03/16/2020   Procedure: CYSTOSCOPY RIGHT RETROGRADE PYELOGRAM URETEROSCOPY/HOLMIUM LASER/STENT PLACEMENT;  Surgeon: Lucas Mallow, MD;  [facility];  Service: Urology;  Laterality: Right;  . INCISION AND DRAINAGE ABSCESS N/A 09/22/2014   Procedure: INCISION AND DRAINAGE ABSCESS POSTERIOR NECK;  Surgeon: Pedro Earls, MD;  [facility];  Service: General;  Laterality: N/A;  . INCISION AND DRAINAGE ABSCESS N/A 12/20/2015   Procedure: INCISION AND DRAINAGE POSTERIOR NECK MASS;  Surgeon: Armandina Gemma, MD;  [facility];  Service: General;  Laterality: N/A;  . INCISION AND DRAINAGE ABSCESS Left 07/10/2004   middle finger  . IR IVC FILTER PLMT / S&I /IMG GUID/MOD SED  07/21/2019  . IR RADIOLOGIST EVAL & MGMT  12/14/2019  . IR VENOGRAM RENAL UNI RIGHT  07/21/2019  . MASS EXCISION N/A 07/21/2017   Procedure: EXCISION OF BENIGN NECK LESION WITH LAYERED CLOSURE;  Surgeon: Irene Limbo, MD;  Location: [place];  Service: Plastics;  Laterality: N/A;  . MASS EXCISION N/A 11/10/2017   Procedure: EXCISION BENIGN LESION OF THE NECK WITH LAYERED CLOSURE;  Surgeon: Irene Limbo, MD;  Location: [place];  Service: Plastics;  Laterality: N/A;    Vitals:   08/01/20 1411  BP: 102/71  Pulse: 61     Subjective Assessment - 08/01/20 1410    Subjective Still doing well, anxious to work  on the stairs.  No HA.    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 No/denies    Pain Onset More than a month ago                             [facility] Adult PT Treatment/Exercise - 08/01/20 1553      Transfers   Transfers Sit to Stand;Stand to Sit    Sit to Stand 5: Supervision;3: Mod assist    Sit to Stand Details (indicate cue type and reason) supervision from chair using RUE and RLE with heavy lateral lean to R and very little WB or activation of LLE.  In // bars addressed sit > stand by placing bilat UE on bars and focusing on anterior lean in midline or with slight lateral lean to L to shift weight forwards and to the L for increased use of LLE during sit > stand.  Once standing focused on active weight shift to LLE and activation of LLE extensors and more upright trunk in midline with visual feedback, tactile cues and verbal cues; performed x 5 reps    Stand to Sit 4: Min assist;3: Mod assist    Stand to Sit Details in // bars with bilat UE support on bars focused on stand > sit with COG in midline and controlled stand > sit with use of trunk flexion to slowly displace COG posteriorly; performed x 5 reps      Ambulation/Gait   Ambulation/Gait Yes    Ambulation/Gait Assistance 3: Mod assist    Ambulation/Gait Assistance Details at beginning of session pt ambulated with L pelvis posteriorly rotated, LLE externally rotated, no heel strike and hip/knee flexed in stance with very short stance time on LLE leading to short step length RLE.  After performing stretches, NMR and stair negotiation pt demonstrated improved gait sequence.  Pt demonstrated improved alignment of pelvis and L hip, heel strike at initial stance and increased stance time on LLE with hip and knee extension in stance and increased step length and weight shift forwards with  RLE.      Ambulation Distance (Feet) 115 Feet    Assistive device Large base quad cane    Gait Pattern Step-to pattern;Step-through pattern;Decreased step length - right;Decreased stance time - left;Decreased stride length;Decreased dorsiflexion - left;Decreased weight shift to left;Lateral trunk lean to right;Trunk rotated posteriorly on left    Ambulation Surface Level;Indoor    Stairs Yes    Stairs Assistance 3: Mod assist;2: Max assist    Stairs Assistance Details (indicate cue type and reason) demonstrated and verbalized safe step to sequence to patient; on one step pt advanced LLE first and required max A to prevent fall due to insufficient hip and knee extensor strength to ascend to next step.  Focused on stance time  on L and ascending with R.  Required some assistance when descending with L for foot placement and weight shift forwards over LLE.    Stair Management Technique One rail Right;Step to pattern;Forwards    Number of Stairs 4    Height of Stairs 6      Neuro Re-ed    Neuro Re-ed Details  In // bars with R foot on 2" block focused on use of visual feedback to shift COG over LLE and to activate LLE hip and knee extensors and to align trunk in midline; performed with LUE support on // bar.  Pt able to maintain alignment for 10 seconds at a time.  Attempted to lift RUE from bar to shift COG further to L but pt unable to maintain balance and had to return to sitting      Knee/Hip Exercises: Stretches   Passive Hamstring Stretch Left;2 reps;60 seconds    Passive Hamstring Stretch Limitations seated in w/c    Other Knee/Hip Stretches In sitting performed passive Hip stretch into internal rotation (stretching hip external rotators), 2 sets x 60 seconds to prepare for standing and to bring LLE into more neutral alignment in standing                  PT Education - 08/01/20 1608    Education Details Pt can stand at stairs and practice picking L foot up and putting it on bottom  step and then putting it back down.  Alerted wife that pt is not strong enought to push up with LLE yet.    Person(s) Educated Patient;Spouse    Methods Explanation    Comprehension Verbalized understanding            PT Short Term Goals - 07/25/20 2130      PT SHORT TERM GOAL #1   Title Pt will be supervision with all transfers for improved mobility.    Baseline supervision/CGA    Time 4    Period Weeks    Status New    Target Date 08/24/20      PT SHORT TERM GOAL #2   Title Pt will decrease 5 x sit to stand from 28.46 sec to <24 sec for improved functional strength and balance.    Baseline 28.46 sec from mat with hand on 07/25/20    Time 4    Period Weeks    Status New    Target Date 08/24/20      PT SHORT TERM GOAL #3   Title Pt will ambulate >150' with large based quad cane min assist for improved mobility.    Baseline 100' with large based quad cane min assist/mod assist    Time 4    Period Weeks    Status New    Target Date 08/24/20             PT Long Term Goals - 07/25/20 2132      PT LONG TERM GOAL #1   Title Pt will increase gait speed from 0.63ms to >0.288m for improved gait safety.    Baseline 0.144mon 07/25/20    Time 8    Period Weeks    Status New    Target Date 09/23/20      PT LONG TERM GOAL #2   Title Pt will be able to demonstrate increased left weight shift to >40% of weight during sit to stand.    Baseline currently <20%    Time 8    Period  Weeks    Status New    Target Date 09/23/20      PT LONG TERM GOAL #3   Title Pt will ambulate >200' with LRAD CGA for improved mobility.    Baseline 100' with large based quad cane min/mod assist.    Time 8    Period Weeks    Status On-going    Target Date 09/23/20                 Plan - 08/01/20 1614    Clinical Impression Statement Continued NMR to focus on increasing active weight shift to midline and L side and increased WB and activation of LLE during sit <> standing, static  standing and stair negotiation with one rail.  Pt tolerated well and demonstrated significant improvement in gait after NMR and stair negotiation training.  Will continue to address and progress towards LTG.    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 08/20/19 w aspiration 08/21/19, (tx empircally w vanco/ cefepime), LLE DVT 07/21/19 and small left lower lung PE 08/01/19 s/p IVC filter 07/21/19, iv heparin discontinued due to worsening ICH 08/14/19, and abdominal wall hematoma, seizure, pseudomonas uti,    Examination-Activity Limitations Bathing;Sit;Bed Mobility;Bend;Squat;Caring for Others;Stairs;Stand;Carry;Toileting;Dressing;Transfers;Hygiene/Grooming;Locomotion Level    Examination-Participation Restrictions Driving;Other;Meal Prep    Rehab Potential Good    PT Frequency 1x / week   plus re-eval   PT Duration 8 weeks    PT Treatment/Interventions ADLs/Self Care Home Management;Aquatic Therapy;Biofeedback;Balance training;Therapeutic exercise;Therapeutic activities;Functional mobility training;Wheelchair mobility training;Orthotic Fit/Training;Stair training;Gait training;DME Instruction;Patient/family education;Cognitive remediation;Neuromuscular re-education;Vestibular;Manual techniques;Passive range of motion;Cryotherapy;Moist Heat    PT Next Visit Plan Continue with gait training with [facility]. Activities to promote increased left weight shift. Stair negotiation with R rail and step to sequence.    Consulted and Agree with Plan of Care Patient    Family Member Consulted wife: Melene Muller           Patient will benefit from skilled therapeutic intervention in order to improve the following deficits and impairments:   Abnormal gait, Difficulty walking, Decreased mobility, Decreased balance, Decreased cognition, Decreased range of motion, Decreased coordination, Decreased safety awareness, Impaired flexibility, Postural dysfunction, Decreased knowledge of use of DME, Decreased strength, Impaired UE functional use, Pain, Impaired tone, Impaired sensation, Decreased endurance  Visit Diagnosis: Other abnormalities of gait and mobility  Muscle weakness (generalized)  Hemiplegia and hemiparesis following cerebral infarction affecting left non-dominant side (HCC)  Unsteadiness on feet     Problem List Patient Active Problem List   Diagnosis Date Noted  . S/P craniotomy 06/05/2020  . History of cranioplasty 06/05/2020  . Peri-rectal abscess 02/14/2020  . Abnormal CT scan, pelvis 02/14/2020  . Pancytopenia ([place]) 12/02/2019  . Nephrolithiasis 12/02/2019  . Hydronephrosis with renal and ureteral calculus obstruction 12/02/2019  . Rectal pain 12/02/2019  . Hyperkalemia 12/02/2019  . Reactive depression   . Wound infection after surgery   . Sleep disturbance   . Dysphagia, post-stroke   . Transaminitis   . 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 Cerebral abscess   . Urinary tract infection without hematuria   . Altered mental status   . Primary hypercoagulable state ([place])   . Acute pulmonary embolism without acute cor pulmonale (HCC)   . Deep vein thrombosis (  DVT) of non-extremity vein   . Hypokalemia   . Acute blood loss anemia   . Leukocytosis   . Endotracheal tube present   . Acute respiratory failure with hypoxemia ([place])   . Stroke (cerebrum) ([place]) 07/19/2019  . Pressure injury of skin 07/19/2019  . Acute CVA (cerebrovascular accident) ([place])   . Encephalopathy   . Dysphagia   . Acute encephalopathy   . Essential hypertension   . Obesity 03/12/2016  . Scalp abscess 12/20/2015  . Neck abscess 12/20/2015  . Pilonidal cyst 02/08/2013    Rico Junker, PT, DPT 08/01/20     4:22 PM    [facility] [address] [place], [place], [address] Phone: [phone]   Fax:  [phone]  Name: Matthew Black MRN: [id number] Date of Birth: 1976-06-19

## 2020-08-08 ENCOUNTER — Other Ambulatory Visit: Payer: Self-pay

## 2020-08-08 ENCOUNTER — Encounter
Payer: Medicaid Other | Attending: Physical Medicine and Rehabilitation | Admitting: Physical Medicine and Rehabilitation

## 2020-08-08 VITALS — BP 105/71 | HR 70 | Temp 98.4°F | Ht 72.0 in | Wt 225.0 lb

## 2020-08-08 DIAGNOSIS — R252 Cramp and spasm: Secondary | ICD-10-CM | POA: Insufficient documentation

## 2020-08-08 DIAGNOSIS — G8929 Other chronic pain: Secondary | ICD-10-CM | POA: Diagnosis not present

## 2020-08-08 DIAGNOSIS — N200 Calculus of kidney: Secondary | ICD-10-CM | POA: Diagnosis not present

## 2020-08-08 DIAGNOSIS — I63511 Cerebral infarction due to unspecified occlusion or stenosis of right middle cerebral artery: Secondary | ICD-10-CM | POA: Diagnosis not present

## 2020-08-08 DIAGNOSIS — I69352 Hemiplegia and hemiparesis following cerebral infarction affecting left dominant side: Secondary | ICD-10-CM | POA: Insufficient documentation

## 2020-08-08 DIAGNOSIS — R519 Headache, unspecified: Secondary | ICD-10-CM | POA: Diagnosis not present

## 2020-08-08 NOTE — Progress Notes (Signed)
Subjective:    Patient ID: Matthew Black, male    DOB: 31-May-1976, 44 y.o.   MRN: [id number]  HPI  Kidney stones resolved following procdure IVC filter to be removed soon. He enjoys cooking, working on stairs. He likes to cook spaghetti and eggs. He is able to walk around the the home. He is able to walk outside as well and wife says that he often walks in front of her.  He plays Scrabble with his kids and enjoys spending time with them.  He asks about whether he may drink coffee He asks about alcohol use.  Wife states that tightness in elbow and knee flexors has been better and improves the more he walks.  Has his sense of humor back.  Headaches have resolved.   Prior history: Matthew Black is a a 44 year old man who presents for hospital follow-up after right middle cerebral artery stroke. His wife accompanies him and provides most of the history.  No longer has pain at surgical site. Wife asks if he should have repeat CT scan. No longer has headache.   Has been having some right sided abdominal pain. Has kidney stone. Has been having daily BM but they are hard. Also has gas.   He has completed his PT and is able to ambulate around his home.   His wife says that prior to his stroke, he liked to play basketball with their kids (ages 68 and 40), play with their dog, travel, and relax at home. He has told her that he is happy to be at home.  He has had tightness in his right elbow and knee flexors and this has been painful to him. He has had two falls and his leg stiffness may have contributed to this.   Pain Inventory Average Pain 0 Pain Right Now 0 My pain is no pain  In the last 24 hours, has pain interfered with the following? General activity 0 Relation with others 0 Enjoyment of life 0 What TIME of day is your pain at its worst? no pain Sleep (in general) Good  Pain is worse with: no pain Pain improves with: na Relief from Meds: no pain  Mobility walk without  assistance walk with assistance use a walker ability to climb steps?  no do you drive?  no use a wheelchair transfers alone  Function I need assistance with the following:  dressing and bathing  Neuro/Psych trouble walking depression  Prior Studies Any changes since last visit?  no    Physicians involved in your care Any changes since last visit?  no   Family History  Problem Relation Age of Onset  . Healthy Mother   . Healthy Father   . Clotting disorder Maternal Grandmother   . Diabetes Paternal Grandfather   . Clotting disorder Maternal Aunt   . Clotting disorder Maternal Uncle   . Colon cancer Neg Hx    Social History   Socioeconomic History  . Marital status: Married    Spouse name: Not on file  . Number of children: 2  . Years of education: Not on file  . Highest education level: Not on file  Occupational History  . Occupation: Clinical biochemist   Tobacco Use  . Smoking status: Former Smoker    Packs/day: 0.00    Quit date: 11/24/2015    Years since quitting: 4.7  . Smokeless tobacco: Never Used  . Tobacco comment:    Vaping Use  . Vaping Use: Never used  Substance and Sexual Activity  . Alcohol use: Not Currently  . Drug use: Never  . Sexual activity: Not Currently  Other Topics Concern  . Not on file  Social History Narrative   Household-- pt, wife, 2 children   Social Determinants of Health   Financial Resource Strain:   . Difficulty of Paying Living Expenses: Not on file  Food Insecurity:   . Worried About Charity fundraiser in the Last Year: Not on file  . Ran Out of Food in the Last Year: Not on file  Transportation Needs:   . Lack of Transportation (Medical): Not on file  . Lack of Transportation (Non-Medical): Not on file  Physical Activity:   . Days of Exercise per Week: Not on file  . Minutes of Exercise per Session: Not on file  Stress:   . Feeling of Stress : Not on file  Social Connections:   . Frequency of Communication  with Friends and Family: Not on file  . Frequency of Social Gatherings with Friends and Family: Not on file  . Attends Religious Services: Not on file  . Active Member of Clubs or Organizations: Not on file  . Attends Archivist Meetings: Not on file  . Marital Status: Not on file   Past Surgical History:  Procedure Laterality Date  . CRANIOPLASTY Right 06/05/2020   Procedure: CRANIOPLASTY;  Surgeon: Consuella Lose, MD;  Location: [place];  Service: Neurosurgery;  Laterality: Right;  right  . CRANIOTOMY Right 07/19/2019   Procedure: RIGHT HEMI-CRANIECTOMY With implantation of skull flap to abdominal wall;  Surgeon: Consuella Lose, MD;  Location: [place];  Service: Neurosurgery;  Laterality: Right;  . CYST EXCISION N/A 10/08/2016   Procedure: EXCISION OF POSTERIOR NECK CYST;  Surgeon: Clovis Riley, MD;  [facility];  Service: General;  Laterality: N/A;  . CYSTOSCOPY/URETEROSCOPY/HOLMIUM LASER/STENT PLACEMENT Right 03/16/2020   Procedure: CYSTOSCOPY RIGHT RETROGRADE PYELOGRAM URETEROSCOPY/HOLMIUM LASER/STENT PLACEMENT;  Surgeon: Lucas Mallow, MD;  [facility];  Service: Urology;  Laterality: Right;  . INCISION AND DRAINAGE ABSCESS N/A 09/22/2014   Procedure: INCISION AND DRAINAGE ABSCESS POSTERIOR NECK;  Surgeon: Pedro Earls, MD;  [facility];  Service: General;  Laterality: N/A;  . INCISION AND DRAINAGE ABSCESS N/A 12/20/2015   Procedure: INCISION AND DRAINAGE POSTERIOR NECK MASS;  Surgeon: Armandina Gemma, MD;  [facility];  Service: General;  Laterality: N/A;  . INCISION AND DRAINAGE ABSCESS Left 07/10/2004   middle finger  . IR IVC FILTER PLMT / S&I /IMG GUID/MOD SED  07/21/2019  . IR RADIOLOGIST EVAL & MGMT  12/14/2019  . IR VENOGRAM RENAL UNI RIGHT  07/21/2019  . MASS EXCISION N/A 07/21/2017   Procedure: EXCISION OF BENIGN NECK LESION WITH LAYERED CLOSURE;  Surgeon: Irene Limbo, MD;  Location: [place];  Service: Plastics;   Laterality: N/A;  . MASS EXCISION N/A 11/10/2017   Procedure: EXCISION BENIGN LESION OF THE NECK WITH LAYERED CLOSURE;  Surgeon: Irene Limbo, MD;  Location: [place];  Service: Plastics;  Laterality: N/A;   Past Medical History:  Diagnosis Date  . 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 Depression   . DVT (deep venous thrombosis) ([place])    BLE DVT 07/21/19, 08/02/19; s/p retrievable IVC filter 07/21/19  . Hemorrhagic stroke ([place])   . History of kidney stones   . Hypertension   . 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 PE (pulmonary thromboembolism) ([place])  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  . Stroke [facility])    RICA, R A1, R MCA occlusion 07/19/19   There were no vitals taken for this visit.  Opioid Risk Score:   Fall Risk Score:  `1  Depression screen PHQ 2/9  Depression screen [facility] 2/9 03/08/2020 09/11/2016 03/11/2016  Decreased Interest 3 0 0  Down, Depressed, Hopeless 3 0 0  PHQ - 2 Score 6 0 0     Review of Systems  Constitutional: Negative.   HENT: Negative.   Eyes: Negative.   Respiratory: Negative.   Cardiovascular: Negative.   Gastrointestinal: Negative.   Genitourinary: Negative.   Musculoskeletal: Positive for gait problem.  Skin: Negative.   Allergic/Immunologic: Negative.   Neurological: Positive for weakness.  Hematological: Negative.   Psychiatric/Behavioral: Positive for dysphoric mood.  All other systems reviewed and are negative.      Objective:   Physical Exam Gen: no distress, normal appearing HEENT: oral mucosa pink and moist, NCAT Cardio: Reg rate Chest: normal effort, normal rate of breathing Abd: soft, non-distended Ext: no edema Skin: Cranial incision has healed very well! Hair growing over nicely.  Psych:Much better communication and bright affect! Was upset and leaned over when I discussed that return to driving would be a slow process.  Musc: No edema in extremities. No tenderness in  extremities. Neuro: 0/5 LUE and LLE. Left inattention persists, tone 1+ LUE and LLE elbow and knee flexors- improved from last visit.  RUE/RLE appear to be 5/5 proximal distal--stable     Assessment & Plan:  Matthew Black is a a 44 year old man who presents for follow-up following his right middle cerebral artery stroke. His wife accompanies him and provides most of the history.  Headache -Continues to be resolved. Advised that he does not required repeat CT Head unless he develops symptoms and signs of infection or bleed--discussed which to look out for.   Abdominal pain: -Resolved following surgical removal of kidney stones  Weakness/spasticity: -Spasticity is much improved- defer Botox for now.  -Discussed transcranial magnetic stimulation as a an option to improve muscle strength. Also discussed Sprint PNS as an option. Provided links and phones numbers for both options for wife to investigate further.  -Made goal to walk outside 15 minutes per day, start resistance training with low weights.   Return to driving: RETURN TO DRIVING PLAN:  WITH THE SUPERVISION OF A LICENSED DRIVER, PLEASE DRIVE IN AN EMPTY PARKING LOT FOR AT LEAST 2-3 TRIALS TO TEST REACTION TIME, VISION, USE OF EQUIPMENT IN CAR, ETC.  Cranial incision healing well.   Quality of life: Discussed spending time with his kids, playing games to keep his mind sharp.   All questions were encouraged and answered. Follow up with me in 2 months. 30 minutes spent in discussion of quality of life, return to driving, importance of walking and time outdoors, playing games to keep his mind sharp, resolved abdominal pain after kidney stone removal, resolved headaches, improved spasticity, discussed transcranial magnetic stimulation, Sprint PNS.

## 2020-08-08 NOTE — Patient Instructions (Addendum)
  Contact: Libby Maw [phone] [email or web address]  Contact: Vanita Panda, MD [phone] [email or web address]  [email or web address]

## 2020-08-09 ENCOUNTER — Encounter: Payer: Self-pay | Admitting: Physical Medicine and Rehabilitation

## 2020-08-10 ENCOUNTER — Ambulatory Visit: Payer: Medicaid Other

## 2020-08-10 ENCOUNTER — Other Ambulatory Visit: Payer: Self-pay

## 2020-08-10 DIAGNOSIS — R2681 Unsteadiness on feet: Secondary | ICD-10-CM | POA: Diagnosis not present

## 2020-08-10 DIAGNOSIS — I69354 Hemiplegia and hemiparesis following cerebral infarction affecting left non-dominant side: Secondary | ICD-10-CM | POA: Diagnosis not present

## 2020-08-10 DIAGNOSIS — R2689 Other abnormalities of gait and mobility: Secondary | ICD-10-CM

## 2020-08-10 DIAGNOSIS — M6281 Muscle weakness (generalized): Secondary | ICD-10-CM | POA: Diagnosis not present

## 2020-08-10 NOTE — Therapy (Signed)
[facility] [address] [place], [place], [address] Phone: [phone]   Fax:  [phone]  Physical Therapy Treatment  Patient Details  Name: Matthew Black MRN: [id number] Date of Birth: [birth year] Referring Provider (PT): Dr. Naaman Plummer but f/u with Dr. Ranell Patrick   Encounter Date: 08/10/2020   PT End of Session - 08/10/20 1359    Visit Number 17    Number of Visits 23    Authorization Type 8 visits 9/8 to 10/27    Authorization - Visit Number 2   pt's initial MCD auth for 3 visits met on 01/19/20, new auth for 12 visits (02/02/20-03/28/20   Authorization - Number of Visits 8    PT Start Time 1357    PT Stop Time 0947    PT Time Calculation (min) 45 min    Equipment Utilized During Treatment Gait belt    Activity Tolerance Patient tolerated treatment well    Behavior During Therapy [facility] for tasks assessed/performed           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   Depression    DVT (deep venous thrombosis) ([place])    BLE DVT 07/21/19, 08/02/19; s/p retrievable IVC filter 07/21/19   Hemorrhagic stroke ([place])    History of kidney stones    Hypertension    Paralysis ([place])    LEFT SIDE   PE (pulmonary thromboembolism) ([place])    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   Stroke ([place])    RICA, R A1, R MCA occlusion 07/19/19    Past Surgical History:  Procedure Laterality Date   CRANIOPLASTY Right 06/05/2020   Procedure: CRANIOPLASTY;  Surgeon: Consuella Lose, MD;  Location: [facility];  Service: Neurosurgery;  Laterality: Right;  right   CRANIOTOMY Right 07/19/2019   Procedure: RIGHT HEMI-CRANIECTOMY With implantation of skull flap to abdominal wall;  Surgeon: Consuella Lose, MD;  Location: [facility];  Service: Neurosurgery;  Laterality: Right;   CYST EXCISION N/A 10/08/2016   Procedure: EXCISION OF POSTERIOR NECK CYST;  Surgeon: Clovis Riley, MD;   Location: [facility];  Service: General;  Laterality: N/A;   CYSTOSCOPY/URETEROSCOPY/HOLMIUM LASER/STENT PLACEMENT Right 03/16/2020   Procedure: CYSTOSCOPY RIGHT RETROGRADE PYELOGRAM URETEROSCOPY/HOLMIUM LASER/STENT PLACEMENT;  Surgeon: Lucas Mallow, MD;  Location: [facility];  Service: Urology;  Laterality: Right;   INCISION AND DRAINAGE ABSCESS N/A 09/22/2014   Procedure: INCISION AND DRAINAGE ABSCESS POSTERIOR NECK;  Surgeon: Pedro Earls, MD;  Location: [facility];  Service: General;  Laterality: N/A;   INCISION AND DRAINAGE ABSCESS N/A 12/20/2015   Procedure: INCISION AND DRAINAGE POSTERIOR NECK MASS;  Surgeon: Armandina Gemma, MD;  Location: [facility];  Service: General;  Laterality: N/A;   INCISION AND DRAINAGE ABSCESS Left 07/10/2004   middle finger   IR IVC FILTER PLMT / S&I /IMG GUID/MOD SED  07/21/2019   IR RADIOLOGIST EVAL & MGMT  12/14/2019   IR VENOGRAM RENAL UNI RIGHT  07/21/2019   MASS EXCISION N/A 07/21/2017   Procedure: EXCISION OF BENIGN NECK LESION WITH LAYERED CLOSURE;  Surgeon: Irene Limbo, MD;  Location: [facility];  Service: Plastics;  Laterality: N/A;   MASS EXCISION N/A 11/10/2017   Procedure: EXCISION BENIGN LESION OF THE NECK WITH LAYERED CLOSURE;  Surgeon: Irene Limbo, MD;  Location: [facility];  Service: Plastics;  Laterality: N/A;    There were no vitals filed for this visit.   Subjective Assessment -  08/10/20 1400    Subjective Pt reports he has been doing great. Reports that he has noticed some pain on stomach where incision is when stretches out to stand or lay.    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 No/denies    Pain Onset More than a month ago                             [facility] Adult PT Treatment/Exercise - 08/10/20 1359      Transfers   Transfers Sit to Stand;Stand to Sit    Sit to Stand 5: Supervision;4: Min guard    Sit to Stand Details Tactile cues for weight shifting;Verbal cues for technique    Stand to Sit 4: Min guard    Stand to Sit Details (indicate cue type and reason) Verbal cues for technique    Stand Pivot Transfers 4: Min guard    Stand Pivot Transfer Details (indicate cue type and reason) mat to w/c left. Verbal cues to turn left foot some first.      Ambulation/Gait   Ambulation/Gait Yes    Ambulation/Gait Assistance 4: Min assist    Ambulation/Gait Assistance Details Ambulated after NMR activities. Pt performed step-to to step-through not fully passing left foot with verbal cues to tighten left quad with weight shift. Left pelvis posteriorly rotated    Ambulation Distance (Feet) 135 Feet   100' x 1   Assistive device Large base quad cane   left AFO and w/c follow first bout   Gait Pattern Step-to pattern;Step-through pattern;Decreased step length - right;Decreased weight shift to left;Decreased stance time - left;Trunk rotated posteriorly on left    Ambulation Surface Level;Indoor    Gait Comments BP=118/86 after gait      Neuro Re-ed    Neuro Re-ed Details  In // bars: standing working on trying to increase left weight shift with PT assisting to shift to left and keeping pelvis straighter. Verbal and tactile cues to tighten left quad with weight shift. Stood initially with RUE support then with no UE  support for about 20 sec. Stepping forward and back with RLE with PT blocking at left knee for safety x 5 then PT helping to keep left foot straight and prevent ER and repeated x 5. Pt with better control with this position but unable to maintain on his own. Sit to stand x 5 with 2" ste under right foot to try to increase left weight shift but PT also had to facilitate as left foot coming off floor at times initially. Tapping 4" step with RLE with PT blocking at left knee x 10 with cues to tighten knee first and to slow down. Pt did have some buckling at times needing max assist to regain but improved with cuing.  Knee/Hip Exercises: Stretches   Other Knee/Hip Stretches Seated left hip IR stretch 30 sec x 3 to stretch ER.                    PT Short Term Goals - 07/25/20 2130      PT SHORT TERM GOAL #1   Title Pt will be supervision with all transfers for improved mobility.    Baseline supervision/CGA    Time 4    Period Weeks    Status New    Target Date 08/24/20      PT SHORT TERM GOAL #2   Title Pt will decrease 5 x sit to stand from 28.46 sec to <24 sec for improved functional strength and balance.    Baseline 28.46 sec from mat with hand on 07/25/20    Time 4    Period Weeks    Status New    Target Date 08/24/20      PT SHORT TERM GOAL #3   Title Pt will ambulate >150' with large based quad cane min assist for improved mobility.    Baseline 100' with large based quad cane min assist/mod assist    Time 4    Period Weeks    Status New    Target Date 08/24/20             PT Long Term Goals - 07/25/20 2132      PT LONG TERM GOAL #1   Title Pt will increase gait speed from 0.36ms to >0.268m for improved gait safety.    Baseline 0.1453mon 07/25/20    Time 8    Period Weeks    Status New    Target Date 09/23/20      PT LONG TERM GOAL #2   Title Pt will be able to demonstrate increased left weight shift to >40% of weight during sit to stand.    Baseline  currently <20%    Time 8    Period Weeks    Status New    Target Date 09/23/20      PT LONG TERM GOAL #3   Title Pt will ambulate >200' with LRAD CGA for improved mobility.    Baseline 100' with large based quad cane min/mod assist.    Time 8    Period Weeks    Status On-going    Target Date 09/23/20                 Plan - 08/10/20 1458    Clinical Impression Statement Pt was able to increase gait distance with less knee flexion on left after NMR in // bars. He did better with not taking as big a step on right to help with knee stability.    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 08/20/19 w aspiration 08/21/19, (tx empircally w vanco/ cefepime), LLE DVT 07/21/19 and small left lower lung PE 08/01/19 s/p IVC filter 07/21/19, iv heparin discontinued due to worsening ICH 08/14/19, and abdominal wall hematoma, seizure, pseudomonas uti,    Examination-Activity Limitations Bathing;Sit;Bed Mobility;Bend;Squat;Caring for Others;Stairs;Stand;Carry;Toileting;Dressing;Transfers;Hygiene/Grooming;Locomotion Level    Examination-Participation Restrictions Driving;Other;Meal Prep    Rehab Potential Good    PT Frequency 1x / week   plus re-eval   PT Duration 8 weeks  PT Treatment/Interventions ADLs/Self Care Home Management;Aquatic Therapy;Biofeedback;Balance training;Therapeutic exercise;Therapeutic activities;Functional mobility training;Wheelchair mobility training;Orthotic Fit/Training;Stair training;Gait training;DME Instruction;Patient/family education;Cognitive remediation;Neuromuscular re-education;Vestibular;Manual techniques;Passive range of motion;Cryotherapy;Moist Heat    PT Next Visit Plan Continue  with gait training with [facility]. Activities to promote increased left weight shift. Stair negotiation with R rail and step to sequence.    Consulted and Agree with Plan of Care Patient           Patient will benefit from skilled therapeutic intervention in order to improve the following deficits and impairments:  Abnormal gait, Difficulty walking, Decreased mobility, Decreased balance, Decreased cognition, Decreased range of motion, Decreased coordination, Decreased safety awareness, Impaired flexibility, Postural dysfunction, Decreased knowledge of use of DME, Decreased strength, Impaired UE functional use, Pain, Impaired tone, Impaired sensation, Decreased endurance  Visit Diagnosis: Other abnormalities of gait and mobility  Muscle weakness (generalized)  Hemiplegia and hemiparesis following cerebral infarction affecting left non-dominant side [facility])     Problem List Patient Active Problem List   Diagnosis Date Noted   S/P craniotomy 06/05/2020   History of cranioplasty 06/05/2020   Peri-rectal abscess 02/14/2020   Abnormal CT scan, pelvis 02/14/2020   Pancytopenia ([place]) 12/02/2019   Nephrolithiasis 12/02/2019   Hydronephrosis with renal and ureteral calculus obstruction 12/02/2019   Rectal pain 12/02/2019   Hyperkalemia 12/02/2019   Reactive depression    Wound infection after surgery    Sleep disturbance    Dysphagia, post-stroke    Transaminitis    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   Cerebral abscess    Urinary tract infection without hematuria    Altered mental status    Primary hypercoagulable state ([place])    Acute pulmonary embolism without acute cor pulmonale (HCC)    Deep vein thrombosis (DVT) of non-extremity vein    Hypokalemia    Acute blood loss anemia    Leukocytosis    Endotracheal tube present    Acute respiratory failure with hypoxemia (H[facility])    Stroke (cerebrum) ([place]) 07/19/2019   Pressure injury of skin  07/19/2019   Acute CVA (cerebrovascular accident) ([place])    Encephalopathy    Dysphagia    Acute encephalopathy    Essential hypertension    Obesity 03/12/2016   Scalp abscess 12/20/2015   Neck abscess 12/20/2015   Pilonidal cyst 02/08/2013    Electa Sniff, PT, DPT, NCS 08/10/2020, 3:00 PM  [facility] [address] [place], [place], [address] Phone: [phone]   Fax:  [phone]  Name: GAREK SCHUNEMAN MRN: [id number] Date of Birth: [birth year]

## 2020-08-15 ENCOUNTER — Ambulatory Visit: Payer: Medicaid Other

## 2020-08-15 ENCOUNTER — Other Ambulatory Visit: Payer: Self-pay

## 2020-08-15 DIAGNOSIS — M6281 Muscle weakness (generalized): Secondary | ICD-10-CM | POA: Diagnosis not present

## 2020-08-15 DIAGNOSIS — R2689 Other abnormalities of gait and mobility: Secondary | ICD-10-CM | POA: Diagnosis not present

## 2020-08-15 DIAGNOSIS — R2681 Unsteadiness on feet: Secondary | ICD-10-CM | POA: Diagnosis not present

## 2020-08-15 DIAGNOSIS — I69354 Hemiplegia and hemiparesis following cerebral infarction affecting left non-dominant side: Secondary | ICD-10-CM

## 2020-08-15 NOTE — Therapy (Signed)
[facility] [address] [place], [place], [address] Phone: [phone]   Fax:  [phone]  Physical Therapy Treatment  Patient Details  Name: Matthew Black MRN: [id number] Date of Birth: Aug 03, 1976 Referring Provider (PT): Dr. Naaman Plummer but f/u with Dr. Ranell Patrick   Encounter Date: 08/15/2020   PT End of Session - 08/15/20 1322    Visit Number 18    Number of Visits 23    Authorization Type 8 visits 9/8 to 10/27    Authorization - Visit Number 3   pt's initial MCD auth for 3 visits met on 01/19/20, new auth for 12 visits (02/02/20-03/28/20   Authorization - Number of Visits 8    PT Start Time 1318    PT Stop Time 1401    PT Time Calculation (min) 43 min    Equipment Utilized During Treatment Gait belt    Activity Tolerance Patient tolerated treatment well    Behavior During Therapy [facility] for tasks assessed/performed           Past Medical History:  Diagnosis Date  . 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 Depression   . DVT (deep venous thrombosis) ([place])    BLE DVT 07/21/19, 08/02/19; s/p retrievable IVC filter 07/21/19  . Hemorrhagic stroke ([place])   . History of kidney stones   . Hypertension   . 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 PE (pulmonary thromboembolism) ([place])    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  . Stroke [facility])    RICA, R A1, R MCA occlusion 07/19/19    Past Surgical History:  Procedure Laterality Date  . CRANIOPLASTY Right 06/05/2020   Procedure: CRANIOPLASTY;  Surgeon: Consuella Lose, MD;  Location: [facility];  Service: Neurosurgery;  Laterality: Right;  right  . CRANIOTOMY Right 07/19/2019   Procedure: RIGHT HEMI-CRANIECTOMY With implantation of skull flap to abdominal wall;  Surgeon: Consuella Lose, MD;  Location: [facility];  Service: Neurosurgery;  Laterality: Right;  . CYST EXCISION N/A 10/08/2016   Procedure: EXCISION OF POSTERIOR NECK CYST;  Surgeon: Clovis Riley, MD;   [facility];  Service: General;  Laterality: N/A;  . CYSTOSCOPY/URETEROSCOPY/HOLMIUM LASER/STENT PLACEMENT Right 03/16/2020   Procedure: CYSTOSCOPY RIGHT RETROGRADE PYELOGRAM URETEROSCOPY/HOLMIUM LASER/STENT PLACEMENT;  Surgeon: Lucas Mallow, MD;  [facility];  Service: Urology;  Laterality: Right;  . INCISION AND DRAINAGE ABSCESS N/A 09/22/2014   Procedure: INCISION AND DRAINAGE ABSCESS POSTERIOR NECK;  Surgeon: Pedro Earls, MD;  [facility];  Service: General;  Laterality: N/A;  . INCISION AND DRAINAGE ABSCESS N/A 12/20/2015   Procedure: INCISION AND DRAINAGE POSTERIOR NECK MASS;  Surgeon: Armandina Gemma, MD;  [facility];  Service: General;  Laterality: N/A;  . INCISION AND DRAINAGE ABSCESS Left 07/10/2004   middle finger  . IR IVC FILTER PLMT / S&I /IMG GUID/MOD SED  07/21/2019  . IR RADIOLOGIST EVAL & MGMT  12/14/2019  . IR VENOGRAM RENAL UNI RIGHT  07/21/2019  . MASS EXCISION N/A 07/21/2017   Procedure: EXCISION OF BENIGN NECK LESION WITH LAYERED CLOSURE;  Surgeon: Irene Limbo, MD;  Location: [facility];  Service: Plastics;  Laterality: N/A;  . MASS EXCISION N/A 11/10/2017   Procedure: EXCISION BENIGN LESION OF THE NECK WITH LAYERED CLOSURE;  Surgeon: Irene Limbo, MD;  Location: [facility];  Service: Plastics;  Laterality: N/A;    There were no vitals filed for this visit.   Subjective Assessment -  08/15/20 1322    Subjective Pt reports fall the other day when he was standing from home tilt w/c to get cereal out of cabinet and the w/c rolled back as he can not lock that one as brakes back on back wheels. Reports that he hit left side of head and left arm and leg but denies any pain or injury. No bruising or swelling noted.    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 No/denies    Pain Onset More than a month ago                             [facility] Adult PT Treatment/Exercise - 08/15/20 1324      Bed Mobility   Bed Mobility Supine to Sit;Sit to Supine    Supine to Sit Minimal Assistance - Patient > 75%   to left side   Sit to Supine Minimal Assistance - Patient > 75%   at left leg     Transfers   Transfers Sit to Stand;Stand to Sit;Stand Pivot Transfers    Sit to Stand 5: Supervision    Stand to Sit 5: Supervision    Stand to Sit Details (indicate cue type and reason) Verbal cues for technique    Stand to Sit Details Verbal cues to lean foward to try to bend more versus falling back in chair    Stand Pivot Transfers 5: Supervision;4: Min guard    Stand Pivot Transfer Details (indicate cue type and reason) supervision to right and CGA to left      Ambulation/Gait   Ambulation/Gait Yes    Ambulation/Gait Assistance 4: Min assist    Ambulation/Gait Assistance Details PT provided tactile cues at left knee through some periods during gait to facilitate more knee extension during stance. Pt did have more clonus as started to fatigue more from hip flexor mostly. W/c follow for last 25' only.    Ambulation Distance (Feet) 140 Feet    Assistive device Large base quad cane   left AFO   Gait Pattern Step-to pattern;Step-through pattern;Decreased stance time - left;Decreased step length - right;Decreased weight shift to left;Trunk rotated posteriorly on  left    Ambulation Surface Level;Indoor      Neuro Re-ed    Neuro Re-ed Details  In // bars: stepping forward and back with RLE with PT maintaining LLE in neutral position and blocking left knee x 10, tapping 2" beam with RLE x 10 with PT assisting LLE the same as prior min assist. Sit to stand x 5 without UE support with PT assisting to bring left pelvis forward more and keep left foot straight.      Exercises   Exercises Other Exercises    Other Exercises  Bridges x 10 with verbal cues to increase left pelvic rise  and mod assis to stabilize left hip in neutral. Pt reported some pain in left anterior hip. Left hip flexor stretch 30 sec x 3 off edge of mat.                    PT Short Term Goals - 07/25/20 2130      PT SHORT TERM GOAL #1   Title Pt will be supervision with all transfers for improved mobility.    Baseline supervision/CGA    Time 4    Period Weeks    Status New    Target Date 08/24/20      PT SHORT TERM GOAL #2   Title Pt will decrease 5 x sit to stand from 28.46 sec to <24 sec for improved functional strength and balance.    Baseline 28.46 sec from mat with hand on 07/25/20    Time 4    Period Weeks    Status New    Target Date 08/24/20      PT SHORT TERM GOAL #3   Title Pt will ambulate >150' with large based quad cane min assist for improved mobility.    Baseline 100' with large based quad cane min assist/mod assist    Time 4    Period Weeks    Status New    Target Date 08/24/20             PT Long Term Goals - 07/25/20 2132      PT LONG TERM GOAL #1   Title Pt will increase gait speed from 0.66ms to >0.264m for improved gait safety.    Baseline 0.1453mon 07/25/20    Time 8    Period Weeks    Status New    Target Date 09/23/20      PT LONG TERM GOAL #2   Title Pt will be able to demonstrate increased left weight shift to >40% of weight during sit to stand.    Baseline currently <20%    Time 8    Period Weeks    Status New    Target  Date 09/23/20      PT LONG TERM GOAL #3   Title Pt will ambulate >200' with LRAD CGA for improved mobility.    Baseline 100' with large based quad cane min/mod assist.    Time 8    Period Weeks    Status On-going    Target Date 09/23/20                 Plan - 08/15/20 1646    Clinical Impression Statement Pt was again able to increase gait distance. Did not tightness in left hip flexor with more clonus when first got up and as fatigued. Incorporated some stretching in to session to try to help.    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 08/20/19 w aspiration 08/21/19, (tx empircally w vanco/ cefepime), LLE DVT 07/21/19 and small left lower lung PE 08/01/19 s/p IVC filter 07/21/19, iv heparin discontinued due to worsening ICH 08/14/19, and abdominal wall hematoma, seizure, pseudomonas uti,    Examination-Activity Limitations Bathing;Sit;Bed Mobility;Bend;Squat;Caring for Others;Stairs;Stand;Carry;Toileting;Dressing;Transfers;Hygiene/Grooming;Locomotion Level    Examination-Participation Restrictions Driving;Other;Meal Prep    Rehab Potential Good    PT Frequency 1x / week  plus re-eval   PT Duration 8 weeks    PT Treatment/Interventions ADLs/Self Care Home Management;Aquatic Therapy;Biofeedback;Balance training;Therapeutic exercise;Therapeutic activities;Functional mobility training;Wheelchair mobility training;Orthotic Fit/Training;Stair training;Gait training;DME Instruction;Patient/family education;Cognitive remediation;Neuromuscular re-education;Vestibular;Manual techniques;Passive range of motion;Cryotherapy;Moist Heat    PT Next Visit Plan Check STGs next session. Continue with gait training with  LBQC. Activities to promote increased left weight shift. Stair negotiation with R rail and step to sequence. Can we add left hip flexor stretch for home?    Consulted and Agree with Plan of Care Patient           Patient will benefit from skilled therapeutic intervention in order to improve the following deficits and impairments:  Abnormal gait, Difficulty walking, Decreased mobility, Decreased balance, Decreased cognition, Decreased range of motion, Decreased coordination, Decreased safety awareness, Impaired flexibility, Postural dysfunction, Decreased knowledge of use of DME, Decreased strength, Impaired UE functional use, Pain, Impaired tone, Impaired sensation, Decreased endurance  Visit Diagnosis: Other abnormalities of gait and mobility  Muscle weakness (generalized)  Hemiplegia and hemiparesis following cerebral infarction affecting left non-dominant side [facility])     Problem List Patient Active Problem List   Diagnosis Date Noted  . S/P craniotomy 06/05/2020  . History of cranioplasty 06/05/2020  . Peri-rectal abscess 02/14/2020  . Abnormal CT scan, pelvis 02/14/2020  . Pancytopenia ([place]) 12/02/2019  . Nephrolithiasis 12/02/2019  . Hydronephrosis with renal and ureteral calculus obstruction 12/02/2019  . Rectal pain 12/02/2019  . Hyperkalemia 12/02/2019  . Reactive depression   . Wound infection after surgery   . Sleep disturbance   . Dysphagia, post-stroke   . Transaminitis   . 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 Cerebral abscess   . Urinary tract infection without hematuria   . Altered mental status   . Primary hypercoagulable state ([place])   . Acute pulmonary embolism without acute cor pulmonale (HCC)   . Deep vein thrombosis (DVT) of non-extremity vein   . Hypokalemia   . Acute blood loss anemia   . Leukocytosis   . Endotracheal tube present   . Acute respiratory failure with hypoxemia ([place])   . Stroke (cerebrum) ([place]) 07/19/2019  .  Pressure injury of skin 07/19/2019  . Acute CVA (cerebrovascular accident) ([place])   . Encephalopathy   . Dysphagia   . Acute encephalopathy   . Essential hypertension   . Obesity 03/12/2016  . Scalp abscess 12/20/2015  . Neck abscess 12/20/2015  . Pilonidal cyst 02/08/2013    Electa Sniff, PT, DPT, NCS 08/15/2020, 4:49 PM  [facility] [address] [place], [place], [address] Phone: [phone]   Fax:  [phone]  Name: Matthew Black MRN: [id number] Date of Birth: [birth year]

## 2020-08-22 ENCOUNTER — Other Ambulatory Visit: Payer: Self-pay

## 2020-08-22 ENCOUNTER — Ambulatory Visit: Payer: Medicaid Other

## 2020-08-22 DIAGNOSIS — R2689 Other abnormalities of gait and mobility: Secondary | ICD-10-CM | POA: Diagnosis not present

## 2020-08-22 DIAGNOSIS — M6281 Muscle weakness (generalized): Secondary | ICD-10-CM

## 2020-08-22 DIAGNOSIS — I69354 Hemiplegia and hemiparesis following cerebral infarction affecting left non-dominant side: Secondary | ICD-10-CM

## 2020-08-22 DIAGNOSIS — R2681 Unsteadiness on feet: Secondary | ICD-10-CM | POA: Diagnosis not present

## 2020-08-22 NOTE — Therapy (Signed)
[facility] [address] [place], [place], [address] Phone: [phone]   Fax:  [phone]  Physical Therapy Treatment  Patient Details  Name: Matthew Black MRN: [id number] Date of Birth: [birth year] Referring Provider (PT): Dr. Naaman Plummer but f/u with Dr. Ranell Patrick   Encounter Date: 08/22/2020   PT End of Session - 08/22/20 1150    Visit Number 19    Number of Visits 23    Authorization Type 8 visits 9/8 to 10/27    Authorization - Visit Number 4   pt's initial MCD auth for 3 visits met on 01/19/20, new auth for 12 visits (02/02/20-03/28/20   Authorization - Number of Visits 8    PT Start Time 1148    PT Stop Time 1229    PT Time Calculation (min) 41 min    Equipment Utilized During Treatment Gait belt    Activity Tolerance Patient tolerated treatment well    Behavior During Therapy [facility] for tasks assessed/performed           Past Medical History:  Diagnosis Date  . 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 Depression   . DVT (deep venous thrombosis) ([place])    BLE DVT 07/21/19, 08/02/19; s/p retrievable IVC filter 07/21/19  . Hemorrhagic stroke ([place])   . History of kidney stones   . Hypertension   . 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 PE (pulmonary thromboembolism) ([place])    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  . Stroke [facility])    RICA, R A1, R MCA occlusion 07/19/19    Past Surgical History:  Procedure Laterality Date  . CRANIOPLASTY Right 06/05/2020   Procedure: CRANIOPLASTY;  Surgeon: Consuella Lose, MD;  Location: [facility];  Service: Neurosurgery;  Laterality: Right;  right  . CRANIOTOMY Right 07/19/2019   Procedure: RIGHT HEMI-CRANIECTOMY With implantation of skull flap to abdominal wall;  Surgeon: Consuella Lose, MD;  Location: [facility];  Service: Neurosurgery;  Laterality: Right;  . CYST EXCISION N/A 10/08/2016   Procedure: EXCISION OF POSTERIOR NECK CYST;  Surgeon: Clovis Riley, MD;   [facility];  Service: General;  Laterality: N/A;  . CYSTOSCOPY/URETEROSCOPY/HOLMIUM LASER/STENT PLACEMENT Right 03/16/2020   Procedure: CYSTOSCOPY RIGHT RETROGRADE PYELOGRAM URETEROSCOPY/HOLMIUM LASER/STENT PLACEMENT;  Surgeon: Lucas Mallow, MD;  [facility];  Service: Urology;  Laterality: Right;  . INCISION AND DRAINAGE ABSCESS N/A 09/22/2014   Procedure: INCISION AND DRAINAGE ABSCESS POSTERIOR NECK;  Surgeon: Pedro Earls, MD;  [facility];  Service: General;  Laterality: N/A;  . INCISION AND DRAINAGE ABSCESS N/A 12/20/2015   Procedure: INCISION AND DRAINAGE POSTERIOR NECK MASS;  Surgeon: Armandina Gemma, MD;  [facility];  Service: General;  Laterality: N/A;  . INCISION AND DRAINAGE ABSCESS Left 07/10/2004   middle finger  . IR IVC FILTER PLMT / S&I /IMG GUID/MOD SED  07/21/2019  . IR RADIOLOGIST EVAL & MGMT  12/14/2019  . IR VENOGRAM RENAL UNI RIGHT  07/21/2019  . MASS EXCISION N/A 07/21/2017   Procedure: EXCISION OF BENIGN NECK LESION WITH LAYERED CLOSURE;  Surgeon: Irene Limbo, MD;  Location: [facility];  Service: Plastics;  Laterality: N/A;  . MASS EXCISION N/A 11/10/2017   Procedure: EXCISION BENIGN LESION OF THE NECK WITH LAYERED CLOSURE;  Surgeon: Irene Limbo, MD;  Location: [facility];  Service: Plastics;  Laterality: N/A;    There were no vitals filed for this visit.   Subjective Assessment -  08/22/20 1151    Subjective Pt reports he is doing well. Still working on standing at home. No new falls.    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 No/denies    Pain Onset More than a month ago                             [facility] Adult PT Treatment/Exercise - 08/22/20 1152      Transfers   Transfers Sit to Stand;Stand to [employer] Transfers    Sit to Stand 5: Supervision;4: Min guard    Sit to Stand Details Verbal cues for technique    Sit to Stand Details (indicate cue type and reason) Pt has most weight on right side with rising.. Cued not to brace legs on mat    Five time sit to stand comments  28 sec from mat without hand    Stand to Sit 5: Supervision    Stand Pivot Transfers 4: Min guard    Stand Pivot Transfer Details (indicate cue type and reason) w/c to mat      Ambulation/Gait   Ambulation/Gait Yes    Ambulation/Gait Assistance 4: Min assist    Ambulation Distance (Feet) 140 Feet      Neuro Re-ed    Neuro Re-ed Details  Standing in front of mat:  weight shifting to left x 5 with PT student helping to stabilize at left knee and better position foot. Then mirror in front for visual cues standing with erect posture with PT facilitating at pelvis from behind to prevent posterior rotation on left and tactile cues at chest to stand more erect with PT student at left leg x 30 sec holding to large based quad cane. Then performed raising right arm overhead x 5 for lengthening of right side to help facilitate left weight shift as well. In // bars:  with 2" step under right foot to faciliate weight shift with mod assist with sit to stand in bars to try to facilitate weight shift over left. Pt initially having all weight on RLE with LLE floating. Verbal and tactile cues to put left foot down and to straighten foot more with PT student  blocking left knee for safety. Then decreased UE support on right with letting go with right hand.                    PT Short Term Goals - 08/22/20 1958      PT SHORT TERM GOAL #1   Title Pt will be supervision with all transfers for improved mobility.    Baseline supervsion with sit to stand, CGA with stand/pivot    Time 4    Period Weeks    Status Partially Met    Target Date 08/24/20  PT SHORT TERM GOAL #2   Title Pt will decrease 5 x sit to stand from 28.46 sec to <24 sec for improved functional strength and balance.    Baseline 28.46 sec from mat with hand on 07/25/20, 08/22/20 28 sec    Time 4    Period Weeks    Status Not Met    Target Date 08/24/20      PT SHORT TERM GOAL #3   Title Pt will ambulate >150' with large based quad cane min assist for improved mobility.    Baseline 100' with large based quad cane min assist/mod assist. 08/22/20 140' min assist    Time 4    Period Weeks    Status Partially Met    Target Date 08/24/20             PT Long Term Goals - 07/25/20 2132      PT LONG TERM GOAL #1   Title Pt will increase gait speed from 0.11ms to >0.255m for improved gait safety.    Baseline 0.146mon 07/25/20    Time 8    Period Weeks    Status New    Target Date 09/23/20      PT LONG TERM GOAL #2   Title Pt will be able to demonstrate increased left weight shift to >40% of weight during sit to stand.    Baseline currently <20%    Time 8    Period Weeks    Status New    Target Date 09/23/20      PT LONG TERM GOAL #3   Title Pt will ambulate >200' with LRAD CGA for improved mobility.    Baseline 100' with large based quad cane min/mod assist.    Time 8    Period Weeks    Status On-going    Target Date 09/23/20                 Plan - 08/22/20 2000    Clinical Impression Statement Pt has progressed gait distance just short of STG. He has been consistently more min assist/CGA with gait. PT continues to work on trying to  incresae left weight shift. Pt continues to benefit from skilled PT to improve strength, balance and functional mobility.    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 08/20/19 w aspiration 08/21/19, (tx empircally w vanco/ cefepime), LLE DVT 07/21/19 and small left lower lung PE 08/01/19 s/p IVC filter 07/21/19, iv heparin discontinued due to worsening ICH 08/14/19, and abdominal wall hematoma, seizure, pseudomonas uti,    Examination-Activity Limitations Bathing;Sit;Bed Mobility;Bend;Squat;Caring for Others;Stairs;Stand;Carry;Toileting;Dressing;Transfers;Hygiene/Grooming;Locomotion Level    Examination-Participation Restrictions Driving;Other;Meal Prep    Rehab Potential Good    PT Frequency 1x / week   plus re-eval   PT Duration 8 weeks    PT Treatment/Interventions ADLs/Self Care Home Management;Aquatic Therapy;Biofeedback;Balance training;Therapeutic exercise;Therapeutic activities;Functional mobility training;Wheelchair mobility training;Orthotic Fit/Training;Stair training;Gait training;DME Instruction;Patient/family education;Cognitive remediation;Neuromuscular re-education;Vestibular;Manual techniques;Passive range of motion;Cryotherapy;Moist Heat    PT Next Visit Plan Continue with gait training with LBQC. Activities to promote increased left weight shift. Stair negotiation with R rail and step to sequence. Can we add left hip flexor stretch for home?    Consulted and Agree with Plan of Care Patient  Patient will benefit from skilled therapeutic intervention in order to improve the following deficits and impairments:  Abnormal gait, Difficulty walking, Decreased mobility, Decreased balance, Decreased  cognition, Decreased range of motion, Decreased coordination, Decreased safety awareness, Impaired flexibility, Postural dysfunction, Decreased knowledge of use of DME, Decreased strength, Impaired UE functional use, Pain, Impaired tone, Impaired sensation, Decreased endurance  Visit Diagnosis: Other abnormalities of gait and mobility  Muscle weakness (generalized)  Hemiplegia and hemiparesis following cerebral infarction affecting left non-dominant side [facility])     Problem List Patient Active Problem List   Diagnosis Date Noted  . S/P craniotomy 06/05/2020  . History of cranioplasty 06/05/2020  . Peri-rectal abscess 02/14/2020  . Abnormal CT scan, pelvis 02/14/2020  . Pancytopenia ([place]) 12/02/2019  . Nephrolithiasis 12/02/2019  . Hydronephrosis with renal and ureteral calculus obstruction 12/02/2019  . Rectal pain 12/02/2019  . Hyperkalemia 12/02/2019  . Reactive depression   . Wound infection after surgery   . Sleep disturbance   . Dysphagia, post-stroke   . Transaminitis   . 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 Cerebral abscess   . Urinary tract infection without hematuria   . Altered mental status   . Primary hypercoagulable state ([place])   . Acute pulmonary embolism without acute cor pulmonale (HCC)   . Deep vein thrombosis (DVT) of non-extremity vein   . Hypokalemia   . Acute blood loss anemia   . Leukocytosis   . Endotracheal tube present   . Acute respiratory failure with hypoxemia ([place])   . Stroke (cerebrum) ([place]) 07/19/2019  . Pressure injury of skin 07/19/2019  . Acute CVA (cerebrovascular accident) ([place])   . Encephalopathy   . Dysphagia   . Acute encephalopathy   . Essential hypertension   . Obesity 03/12/2016  . Scalp abscess 12/20/2015  . Neck abscess 12/20/2015  . Pilonidal cyst 02/08/2013    Electa Sniff, PT, DPT, NCS 08/22/2020, 8:03 PM  [facility] [address] [place], [place], [address] Phone: [phone]   Fax:  [phone]  Name: Matthew Black MRN: [id number] Date of Birth: 1976-07-19

## 2020-08-29 ENCOUNTER — Ambulatory Visit: Payer: Medicaid Other

## 2020-09-04 ENCOUNTER — Other Ambulatory Visit: Payer: Self-pay | Admitting: Physical Medicine & Rehabilitation

## 2020-09-04 DIAGNOSIS — R252 Cramp and spasm: Secondary | ICD-10-CM

## 2020-09-05 ENCOUNTER — Ambulatory Visit: Payer: Medicaid Other | Attending: Physician Assistant

## 2020-09-05 ENCOUNTER — Other Ambulatory Visit: Payer: Self-pay

## 2020-09-05 DIAGNOSIS — R2681 Unsteadiness on feet: Secondary | ICD-10-CM | POA: Diagnosis not present

## 2020-09-05 DIAGNOSIS — R2689 Other abnormalities of gait and mobility: Secondary | ICD-10-CM | POA: Insufficient documentation

## 2020-09-05 DIAGNOSIS — M6281 Muscle weakness (generalized): Secondary | ICD-10-CM | POA: Insufficient documentation

## 2020-09-05 DIAGNOSIS — I69354 Hemiplegia and hemiparesis following cerebral infarction affecting left non-dominant side: Secondary | ICD-10-CM | POA: Diagnosis not present

## 2020-09-05 NOTE — Therapy (Signed)
[facility] [address] [place], [place], [address] Phone: [phone]   Fax:  [phone]  Physical Therapy Treatment  Patient Details  Name: Matthew Black MRN: [id number] Date of Birth: 1976-05-22 Referring Provider (PT): Dr. Naaman Plummer but f/u with Dr. Ranell Patrick   Encounter Date: 09/05/2020   PT End of Session - 09/05/20 1148    Visit Number 20    Number of Visits 23    Authorization Type 8 visits 9/8 to 10/27    Authorization - Visit Number 5   pt's initial MCD auth for 3 visits met on 01/19/20, new auth for 12 visits (02/02/20-03/28/20   Authorization - Number of Visits 8    PT Start Time 1145    PT Stop Time 1228    PT Time Calculation (min) 43 min    Equipment Utilized During Treatment Gait belt    Activity Tolerance Patient tolerated treatment well    Behavior During Therapy [facility] for tasks assessed/performed           Past Medical History:  Diagnosis Date  . 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 Depression   . DVT (deep venous thrombosis) ([place])    BLE DVT 07/21/19, 08/02/19; s/p retrievable IVC filter 07/21/19  . Hemorrhagic stroke ([place])   . History of kidney stones   . Hypertension   . 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 PE (pulmonary thromboembolism) ([place])    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  . Stroke [facility])    RICA, R A1, R MCA occlusion 07/19/19    Past Surgical History:  Procedure Laterality Date  . CRANIOPLASTY Right 06/05/2020   Procedure: CRANIOPLASTY;  Surgeon: Consuella Lose, MD;  Location: [facility];  Service: Neurosurgery;  Laterality: Right;  right  . CRANIOTOMY Right 07/19/2019   Procedure: RIGHT HEMI-CRANIECTOMY With implantation of skull flap to abdominal wall;  Surgeon: Consuella Lose, MD;  Location: [facility];  Service: Neurosurgery;  Laterality: Right;  . CYST EXCISION N/A 10/08/2016   Procedure: EXCISION OF POSTERIOR NECK CYST;  Surgeon: Clovis Riley, MD;   Location: [facility];  Service: General;  Laterality: N/A;  . CYSTOSCOPY/URETEROSCOPY/HOLMIUM LASER/STENT PLACEMENT Right 03/16/2020   Procedure: CYSTOSCOPY RIGHT RETROGRADE PYELOGRAM URETEROSCOPY/HOLMIUM LASER/STENT PLACEMENT;  Surgeon: Lucas Mallow, MD;  Location: [facility];  Service: Urology;  Laterality: Right;  . INCISION AND DRAINAGE ABSCESS N/A 09/22/2014   Procedure: INCISION AND DRAINAGE ABSCESS POSTERIOR NECK;  Surgeon: Pedro Earls, MD;  [facility];  Service: General;  Laterality: N/A;  . INCISION AND DRAINAGE ABSCESS N/A 12/20/2015   Procedure: INCISION AND DRAINAGE POSTERIOR NECK MASS;  Surgeon: Armandina Gemma, MD;  Location[facility];  Service: General;  Laterality: N/A;  . INCISION AND DRAINAGE ABSCESS Left 07/10/2004   middle finger  . IR IVC FILTER PLMT / S&I /IMG GUID/MOD SED  07/21/2019  . IR RADIOLOGIST EVAL & MGMT  12/14/2019  . IR VENOGRAM RENAL UNI RIGHT  07/21/2019  . MASS EXCISION N/A 07/21/2017   Procedure: EXCISION OF BENIGN NECK LESION WITH LAYERED CLOSURE;  Surgeon: Irene Limbo, MD;  Location: [facility];  Service: Plastics;  Laterality: N/A;  . MASS EXCISION N/A 11/10/2017   Procedure: EXCISION BENIGN LESION OF THE NECK WITH LAYERED CLOSURE;  Surgeon: Irene Limbo, MD;  Location: [facility];  Service: Plastics;  Laterality: N/A;    There were no vitals filed for this visit.   Subjective Assessment -  09/05/20 1148    Subjective Pt reports he is doing well. No falls. Has been stretching his hip. Also walking some outside with father-in-law and wife. Reports going about 30-40'.    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 No/denies    Pain Onset More than a month ago                             [facility] Adult PT Treatment/Exercise - 09/05/20 1151      Transfers   Transfers Sit to Stand;Stand to Sit;Squat Pivot Transfers    Sit to Stand 4: Min guard;4: Min assist    Stand to Sit 4: Min guard    Stand Pivot Transfers 4: Min guard    Stand Pivot Transfer Details (indicate cue type and reason) w/c to mat      Ambulation/Gait   Ambulation/Gait Yes    Ambulation/Gait Assistance 4: Min assist    Ambulation/Gait Assistance Details Pt with step-to pattern and some clonus at times on left. PT student applied some pressure to thigh to help. PT raised up the quad cane for last bought which helped with more upright posture.    Ambulation Distance (Feet) 15 Feet   40' x 1 and 90'x1   Assistive device Large base quad cane   left AFO, w/c follow   Gait Pattern Step-to pattern;Decreased stance time - left;Decreased weight shift to left;Decreased hip/knee flexion - left;Decreased step length - right;Decreased step length - left;Left flexed knee in stance;Lateral trunk lean to right    Ambulation Surface Level;Indoor      Neuro Re-ed    Neuro Re-ed Details  In // bars: standing with right foot on 2" step with PT in front and PT student guarding from behind CGA/min assist with verbal/visual and tactile cues to weight shift more to left. Pt was cued to tighten gluts and quad on left. Pt able to get more upright and let go with RUE with adding in reaching. Stood in this position 2-3 minutes. Pt performed sit to stand from chair in // bars with 2" step under RLE x 5 to  try to facilitate more weight shift to the left min assist to rise with pushing from chair and PT behind pt to try to keep hips straight and assist with weight shift with student in front.                    PT Short Term Goals - 08/22/20 1958      PT SHORT TERM GOAL #1   Title Pt will be supervision with all transfers for improved mobility.    Baseline supervsion with sit to stand, CGA with stand/pivot    Time 4    Period Weeks    Status Partially Met  Target Date 08/24/20      PT SHORT TERM GOAL #2   Title Pt will decrease 5 x sit to stand from 28.46 sec to <24 sec for improved functional strength and balance.    Baseline 28.46 sec from mat with hand on 07/25/20, 08/22/20 28 sec    Time 4    Period Weeks    Status Not Met    Target Date 08/24/20      PT SHORT TERM GOAL #3   Title Pt will ambulate >150' with large based quad cane min assist for improved mobility.    Baseline 100' with large based quad cane min assist/mod assist. 08/22/20 140' min assist    Time 4    Period Weeks    Status Partially Met    Target Date 08/24/20             PT Long Term Goals - 07/25/20 2132      PT LONG TERM GOAL #1   Title Pt will increase gait speed from 0.38ms to >0.253m for improved gait safety.    Baseline 0.1447mon 07/25/20    Time 8    Period Weeks    Status New    Target Date 09/23/20      PT LONG TERM GOAL #2   Title Pt will be able to demonstrate increased left weight shift to >40% of weight during sit to stand.    Baseline currently <20%    Time 8    Period Weeks    Status New    Target Date 09/23/20      PT LONG TERM GOAL #3   Title Pt will ambulate >200' with LRAD CGA for improved mobility.    Baseline 100' with large based quad cane min/mod assist.    Time 8    Period Weeks    Status On-going    Target Date 09/23/20                 Plan - 09/05/20 1257    Clinical Impression Statement Pt was able to demonstrate more left weight shift with  NMR activities today in // bars with decreasing UE support. Unable to increase left weight shift during gait but pt does show improved stability.    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 08/20/19 w aspiration 08/21/19, (tx empircally w vanco/ cefepime), LLE DVT 07/21/19 and small left lower lung PE 08/01/19 s/p IVC filter 07/21/19, iv heparin discontinued due to worsening ICH 08/14/19, and abdominal wall hematoma, seizure, pseudomonas uti,    Examination-Activity Limitations Bathing;Sit;Bed Mobility;Bend;Squat;Caring for Others;Stairs;Stand;Carry;Toileting;Dressing;Transfers;Hygiene/Grooming;Locomotion Level    Examination-Participation Restrictions Driving;Other;Meal Prep    Rehab Potential Good    PT Frequency 1x / week   plus re-eval   PT Duration 8 weeks    PT Treatment/Interventions ADLs/Self Care Home Management;Aquatic Therapy;Biofeedback;Balance training;Therapeutic exercise;Therapeutic activities;Functional mobility training;Wheelchair mobility training;Orthotic Fit/Training;Stair training;Gait training;DME Instruction;Patient/family education;Cognitive remediation;Neuromuscular re-education;Vestibular;Manual techniques;Passive range of motion;Cryotherapy;Moist Heat    PT Next Visit Plan Continue with gait training with [facility]. Activities to promote increased left weight shift. Stair negotiation with R rail and step to sequence. Can we add left hip flexor stretch for home?    Consulted and Agree with Plan of Care Patient  Patient will benefit from skilled therapeutic intervention in order to improve the following deficits and impairments:  Abnormal gait, Difficulty walking, Decreased mobility,  Decreased balance, Decreased cognition, Decreased range of motion, Decreased coordination, Decreased safety awareness, Impaired flexibility, Postural dysfunction, Decreased knowledge of use of DME, Decreased strength, Impaired UE functional use, Pain, Impaired tone, Impaired sensation, Decreased endurance  Visit Diagnosis: Other abnormalities of gait and mobility  Muscle weakness (generalized)  Hemiplegia and hemiparesis following cerebral infarction affecting left non-dominant side [facility])     Problem List Patient Active Problem List   Diagnosis Date Noted  . S/P craniotomy 06/05/2020  . History of cranioplasty 06/05/2020  . Peri-rectal abscess 02/14/2020  . Abnormal CT scan, pelvis 02/14/2020  . Pancytopenia ([place]) 12/02/2019  . Nephrolithiasis 12/02/2019  . Hydronephrosis with renal and ureteral calculus obstruction 12/02/2019  . Rectal pain 12/02/2019  . Hyperkalemia 12/02/2019  . Reactive depression   . Wound infection after surgery   . Sleep disturbance   . Dysphagia, post-stroke   . Transaminitis   . 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 Cerebral abscess   . Urinary tract infection without hematuria   . Altered mental status   . Primary hypercoagulable state ([place])   . Acute pulmonary embolism without acute cor pulmonale (HCC)   . Deep vein thrombosis (DVT) of non-extremity vein   . Hypokalemia   . Acute blood loss anemia   . Leukocytosis   . Endotracheal tube present   . Acute respiratory failure with hypoxemia ([place])   . Stroke (cerebrum) ([place]) 07/19/2019  . Pressure injury of skin 07/19/2019  . Acute CVA (cerebrovascular accident) ([place])   . Encephalopathy   . Dysphagia   . Acute encephalopathy   . Essential hypertension   . Obesity 03/12/2016  . Scalp abscess 12/20/2015  . Neck abscess 12/20/2015  . Pilonidal cyst 02/08/2013    Electa Sniff, PT, DPT, NCS 09/05/2020, 1:08 PM  [facility] [address] [place], [place], [address] Phone: [phone]   Fax:  [phone]  Name: Matthew Black MRN: [id number] Date of Birth: 04-Mar-1976

## 2020-09-06 ENCOUNTER — Telehealth: Payer: Self-pay

## 2020-09-06 NOTE — Telephone Encounter (Signed)
Matthew Black would like to know if its okay for him to consume one [name] drink?    Call back ph [phone].

## 2020-09-06 NOTE — Telephone Encounter (Signed)
Please let him know that would be fine, thank you!

## 2020-09-10 NOTE — Telephone Encounter (Signed)
Patient informed. 

## 2020-09-12 ENCOUNTER — Other Ambulatory Visit: Payer: Self-pay

## 2020-09-12 ENCOUNTER — Ambulatory Visit: Payer: Medicaid Other

## 2020-09-12 DIAGNOSIS — R2689 Other abnormalities of gait and mobility: Secondary | ICD-10-CM | POA: Diagnosis not present

## 2020-09-12 DIAGNOSIS — M6281 Muscle weakness (generalized): Secondary | ICD-10-CM

## 2020-09-12 DIAGNOSIS — I69354 Hemiplegia and hemiparesis following cerebral infarction affecting left non-dominant side: Secondary | ICD-10-CM | POA: Diagnosis not present

## 2020-09-12 DIAGNOSIS — R2681 Unsteadiness on feet: Secondary | ICD-10-CM | POA: Diagnosis not present

## 2020-09-12 NOTE — Therapy (Signed)
[facility] [address] [place], [place], [address] Phone: [phone]   Fax:  [phone]  Physical Therapy Treatment  Patient Details  Name: Matthew Black MRN: [id number] Date of Birth: November 26, 1975 Referring Provider (PT): Dr. Naaman Plummer but f/u with Dr. Ranell Patrick   Encounter Date: 09/12/2020   PT End of Session - 09/12/20 1148    Visit Number 21    Number of Visits 23    Authorization Type 8 visits 9/8 to 10/27    Authorization - Visit Number 6   pt's initial MCD auth for 3 visits met on 01/19/20, new auth for 12 visits (02/02/20-03/28/20   Authorization - Number of Visits 8    PT Start Time 1148    PT Stop Time 1230    PT Time Calculation (min) 42 min    Equipment Utilized During Treatment Gait belt    Activity Tolerance Patient tolerated treatment well    Behavior During Therapy [facility] for tasks assessed/performed           Past Medical History:  Diagnosis Date  . 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 Depression   . DVT (deep venous thrombosis) ([place])    BLE DVT 07/21/19, 08/02/19; s/p retrievable IVC filter 07/21/19  . Hemorrhagic stroke ([place])   . History of kidney stones   . Hypertension   . 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 PE (pulmonary thromboembolism) ([place])    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  . Stroke [facility])    RICA, R A1, R MCA occlusion 07/19/19    Past Surgical History:  Procedure Laterality Date  . CRANIOPLASTY Right 06/05/2020   Procedure: CRANIOPLASTY;  Surgeon: Consuella Lose, MD;  Location: [place];  Service: Neurosurgery;  Laterality: Right;  right  . CRANIOTOMY Right 07/19/2019   Procedure: RIGHT HEMI-CRANIECTOMY With implantation of skull flap to abdominal wall;  Surgeon: Consuella Lose, MD;  Location: [place];  Service: Neurosurgery;  Laterality: Right;  . CYST EXCISION N/A 10/08/2016   Procedure: EXCISION OF POSTERIOR NECK CYST;  Surgeon: Clovis Riley, MD;   Location: [facility];  Service: General;  Laterality: N/A;  . CYSTOSCOPY/URETEROSCOPY/HOLMIUM LASER/STENT PLACEMENT Right 03/16/2020   Procedure: CYSTOSCOPY RIGHT RETROGRADE PYELOGRAM URETEROSCOPY/HOLMIUM LASER/STENT PLACEMENT;  Surgeon: Lucas Mallow, MD;  Location: [facility];  Service: Urology;  Laterality: Right;  . INCISION AND DRAINAGE ABSCESS N/A 09/22/2014   Procedure: INCISION AND DRAINAGE ABSCESS POSTERIOR NECK;  Surgeon: Pedro Earls, MD;  Location: [facility];  Service: General;  Laterality: N/A;  . INCISION AND DRAINAGE ABSCESS N/A 12/20/2015   Procedure: INCISION AND DRAINAGE POSTERIOR NECK MASS;  Surgeon: Armandina Gemma, MD;  Location: [facility];  Service: General;  Laterality: N/A;  . INCISION AND DRAINAGE ABSCESS Left 07/10/2004   middle finger  . IR IVC FILTER PLMT / S&I /IMG GUID/MOD SED  07/21/2019  . IR RADIOLOGIST EVAL & MGMT  12/14/2019  . IR VENOGRAM RENAL UNI RIGHT  07/21/2019  . MASS EXCISION N/A 07/21/2017   Procedure: EXCISION OF BENIGN NECK LESION WITH LAYERED CLOSURE;  Surgeon: Irene Limbo, MD;  Location: [place];  Service: Plastics;  Laterality: N/A;  . MASS EXCISION N/A 11/10/2017   Procedure: EXCISION BENIGN LESION OF THE NECK WITH LAYERED CLOSURE;  Surgeon: Irene Limbo, MD;  Location: [facility];  Service: Plastics;  Laterality: N/A;    There were no vitals filed for this visit.   Subjective Assessment -  09/12/20 1149    Subjective Pt reports doing well. No new issues or falls.    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 No/denies    Pain Onset More than a month ago                             [facility] Adult PT Treatment/Exercise - 09/12/20 1149      Transfers   Transfers Sit to Stand;Stand to Sit    Sit to Stand 4: Min guard    Stand to Sit 4: Min guard      Ambulation/Gait   Ambulation/Gait Yes    Ambulation/Gait Assistance 4: Min assist    Ambulation/Gait Assistance Details Pt was cued to try to increase step length some and to keep erect posture to increase left step weight shift. Pt had decreased left stance time. Does get some clonus towards end as fatigues. PT student assisted with tactile cues at left knee to try to keep straight and not flex. Pt's left pelvis is posteriorly rotatated.    Ambulation Distance (Feet) 115 Feet    Assistive device Large base quad cane    Gait Pattern Step-to pattern;Decreased stance time - left;Decreased weight shift to left;Decreased hip/knee flexion - left    Ambulation Surface Level;Indoor      Neuro Re-ed    Neuro Re-ed Details  In // bars: attempted to get on rockerboard but unable to step on to. Switched to working on standing upright with trying to increase left weight shift with PT in front blocking left knee and keeping foot in neutral position then having pt step forwards and back x 10. PT also tried to facilitate more left pelvic rotation. Sit to stand from w/c with 2" step under right foot to facilitate left weight shift but pt overpowers with right leg and left foot comes off floor. Once up worked on increasing left weight shift from same position with PT student stabilizing left knee and trying to keep foot straight as well as PT stabilizing pelvis behind to get more anterior left pelvic rotation.  Pt was able to remove RUE support 20 sec x 2 with therapist's helping to stabilize and giving verbal cues to increase upright posture as well as visual cues in mirror.                  PT Education - 09/12/20 1245    Education Details PT discussed with pt and father-in-law at end of session about plan to try to request 4 more visits and to work more with family trying to get them to be able to walk with patient at home.    Person(s) Educated Patient;Other (comment)   father-in-law   Methods Explanation    Comprehension Verbalized understanding  PT Short Term Goals - 08/22/20 1958      PT SHORT TERM GOAL #1   Title Pt will be supervision with all transfers for improved mobility.    Baseline supervsion with sit to stand, CGA with stand/pivot    Time 4    Period Weeks    Status Partially Met    Target Date 08/24/20      PT SHORT TERM GOAL #2   Title Pt will decrease 5 x sit to stand from 28.46 sec to <24 sec for improved functional strength and balance.    Baseline 28.46 sec from mat with hand on 07/25/20, 08/22/20 28 sec    Time 4    Period Weeks    Status Not Met    Target Date 08/24/20      PT SHORT TERM GOAL #3   Title Pt will ambulate >150' with large based quad cane min assist for improved mobility.    Baseline 100' with large based quad cane min assist/mod assist. 08/22/20 140' min assist    Time 4    Period Weeks    Status Partially Met    Target Date 08/24/20             PT Long Term Goals - 07/25/20 2132      PT LONG TERM GOAL #1   Title Pt will increase gait speed from 0.49ms to >0.223m for improved gait safety.    Baseline 0.1418mon 07/25/20    Time 8    Period Weeks    Status New    Target Date 09/23/20      PT LONG TERM GOAL #2   Title Pt will be able to demonstrate increased left weight shift to >40% of weight during sit to stand.    Baseline currently <20%    Time 8    Period Weeks    Status New    Target Date 09/23/20      PT  LONG TERM GOAL #3   Title Pt will ambulate >200' with LRAD CGA for improved mobility.    Baseline 100' with large based quad cane min/mod assist.    Time 8    Period Weeks    Status On-going    Target Date 09/23/20                 Plan - 09/12/20 1246    Clinical Impression Statement PT continued to focus on increasing left weight shift as well as gait progression. Pt showing improving stability with gait utilizing step-to pattern. Plan to request 4 week extension next week with recert to work more on family training to try to get pt to be able to walk some at home.    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 08/20/19 w aspiration 08/21/19, (tx empircally w vanco/ cefepime), LLE DVT 07/21/19 and small left lower lung PE 08/01/19 s/p IVC filter 07/21/19, iv heparin discontinued due to worsening ICH 08/14/19, and abdominal wall hematoma, seizure, pseudomonas uti,    Examination-Activity Limitations Bathing;Sit;Bed Mobility;Bend;Squat;Caring for Others;Stairs;Stand;Carry;Toileting;Dressing;Transfers;Hygiene/Grooming;Locomotion Level    Examination-Participation Restrictions Driving;Other;Meal Prep    Rehab Potential Good    PT Frequency 1x / week   plus re-eval   PT Duration 8 weeks    PT Treatment/Interventions ADLs/Self  Care Home Management;Aquatic Therapy;Biofeedback;Balance training;Therapeutic exercise;Therapeutic activities;Functional mobility training;Wheelchair mobility training;Orthotic Fit/Training;Stair training;Gait training;DME Instruction;Patient/family education;Cognitive remediation;Neuromuscular re-education;Vestibular;Manual techniques;Passive range of motion;Cryotherapy;Moist Heat    PT Next  Visit Plan Recheck goals for 1 week 4  recert plan. Work with caregiver on trying to be able to ambulate some at home. Continue with gait training with [facility]. Activities to promote increased left weight shift. Stair negotiation with R rail and step to sequence. Can we add left hip flexor stretch for home?    Consulted and Agree with Plan of Care Patient           Patient will benefit from skilled therapeutic intervention in order to improve the following deficits and impairments:  Abnormal gait, Difficulty walking, Decreased mobility, Decreased balance, Decreased cognition, Decreased range of motion, Decreased coordination, Decreased safety awareness, Impaired flexibility, Postural dysfunction, Decreased knowledge of use of DME, Decreased strength, Impaired UE functional use, Pain, Impaired tone, Impaired sensation, Decreased endurance  Visit Diagnosis: Other abnormalities of gait and mobility  Muscle weakness (generalized)  Hemiplegia and hemiparesis following cerebral infarction affecting left non-dominant side [facility])     Problem List Patient Active Problem List   Diagnosis Date Noted  . S/P craniotomy 06/05/2020  . History of cranioplasty 06/05/2020  . Peri-rectal abscess 02/14/2020  . Abnormal CT scan, pelvis 02/14/2020  . Pancytopenia ([place]) 12/02/2019  . Nephrolithiasis 12/02/2019  . Hydronephrosis with renal and ureteral calculus obstruction 12/02/2019  . Rectal pain 12/02/2019  . Hyperkalemia 12/02/2019  . Reactive depression   . Wound infection after surgery   . Sleep disturbance   . Dysphagia, post-stroke   . Transaminitis   . 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 Cerebral abscess   . Urinary tract infection without hematuria   . Altered mental status   . Primary hypercoagulable state ([place])   . Acute pulmonary embolism without acute cor pulmonale ([facility])   . Deep vein thrombosis (DVT) of non-extremity vein   . Hypokalemia   . Acute blood loss anemia    . Leukocytosis   . Endotracheal tube present   . Acute respiratory failure with hypoxemia ([place])   . Stroke (cerebrum) ([place]) 07/19/2019  . Pressure injury of skin 07/19/2019  . Acute CVA (cerebrovascular accident) ([place])   . Encephalopathy   . Dysphagia   . Acute encephalopathy   . Essential hypertension   . Obesity 03/12/2016  . Scalp abscess 12/20/2015  . Neck abscess 12/20/2015  . Pilonidal cyst 02/08/2013    Electa Sniff, PT, DPT, NCS 09/12/2020, 12:58 PM  [facility] [address] [place], [place], [address] Phone: [phone]   Fax:  [phone]  Name: Matthew Black MRN: [id number] Date of Birth: [birth year]

## 2020-09-19 ENCOUNTER — Ambulatory Visit: Payer: Medicaid Other

## 2020-09-19 ENCOUNTER — Other Ambulatory Visit: Payer: Self-pay

## 2020-09-19 DIAGNOSIS — R2681 Unsteadiness on feet: Secondary | ICD-10-CM

## 2020-09-19 DIAGNOSIS — I69354 Hemiplegia and hemiparesis following cerebral infarction affecting left non-dominant side: Secondary | ICD-10-CM

## 2020-09-19 DIAGNOSIS — R2689 Other abnormalities of gait and mobility: Secondary | ICD-10-CM | POA: Diagnosis not present

## 2020-09-19 DIAGNOSIS — M6281 Muscle weakness (generalized): Secondary | ICD-10-CM

## 2020-09-19 NOTE — Therapy (Addendum)
[facility] [address] [place], [place], [address] Phone: [phone]   Fax:  [phone]  Physical Therapy Treatment and Recertification  Patient Details  Name: Matthew Black MRN: [id number] Date of Birth: [birth year] Referring Provider (PT): Dr. Naaman Plummer but f/u with Dr. Ranell Patrick   Encounter Date: 09/19/2020   PT End of Session - 09/19/20 1334    Visit Number 22    Number of Visits 26    Date for PT Re-Evaluation 11/18/20    Authorization Type 8 visits 9/8 to 10/27    Authorization - Visit Number 7   pt's initial MCD auth for 3 visits met on 01/19/20, new auth for 12 visits (02/02/20-03/28/20   Authorization - Number of Visits 8    PT Start Time 1155    PT Stop Time 1234    PT Time Calculation (min) 39 min    Equipment Utilized During Treatment Gait belt    Activity Tolerance Patient tolerated treatment well    Behavior During Therapy [facility] for tasks assessed/performed           Past Medical History:  Diagnosis Date  . 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 Depression   . DVT (deep venous thrombosis) ([place])    BLE DVT 07/21/19, 08/02/19; s/p retrievable IVC filter 07/21/19  . Hemorrhagic stroke ([place])   . History of kidney stones   . Hypertension   . 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 PE (pulmonary thromboembolism) ([place])    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  . Stroke [facility])    RICA, R A1, R MCA occlusion 07/19/19    Past Surgical History:  Procedure Laterality Date  . CRANIOPLASTY Right 06/05/2020   Procedure: CRANIOPLASTY;  Surgeon: Consuella Lose, MD;  Location: [place];  Service: Neurosurgery;  Laterality: Right;  right  . CRANIOTOMY Right 07/19/2019   Procedure: RIGHT HEMI-CRANIECTOMY With implantation of skull flap to abdominal wall;  Surgeon: Consuella Lose, MD;  Location: [place];  Service: Neurosurgery;  Laterality: Right;  . CYST EXCISION N/A 10/08/2016   Procedure: EXCISION OF  POSTERIOR NECK CYST;  Surgeon: Clovis Riley, MD;  [facility];  Service: General;  Laterality: N/A;  . CYSTOSCOPY/URETEROSCOPY/HOLMIUM LASER/STENT PLACEMENT Right 03/16/2020   Procedure: CYSTOSCOPY RIGHT RETROGRADE PYELOGRAM URETEROSCOPY/HOLMIUM LASER/STENT PLACEMENT;  Surgeon: Lucas Mallow, MD;  [facility];  Service: Urology;  Laterality: Right;  . INCISION AND DRAINAGE ABSCESS N/A 09/22/2014   Procedure: INCISION AND DRAINAGE ABSCESS POSTERIOR NECK;  Surgeon: Pedro Earls, MD;  [facility];  Service: General;  Laterality: N/A;  . INCISION AND DRAINAGE ABSCESS N/A 12/20/2015   Procedure: INCISION AND DRAINAGE POSTERIOR NECK MASS;  Surgeon: Armandina Gemma, MD;  [facility];  Service: General;  Laterality: N/A;  . INCISION AND DRAINAGE ABSCESS Left 07/10/2004   middle finger  . IR IVC FILTER PLMT / S&I /IMG GUID/MOD SED  07/21/2019  . IR RADIOLOGIST EVAL & MGMT  12/14/2019  . IR VENOGRAM RENAL UNI RIGHT  07/21/2019  . MASS EXCISION N/A 07/21/2017   Procedure: EXCISION OF BENIGN NECK LESION WITH LAYERED CLOSURE;  Surgeon: Irene Limbo, MD;  Location: [place];  Service: Plastics;  Laterality: N/A;  . MASS EXCISION N/A 11/10/2017   Procedure: EXCISION BENIGN LESION OF THE NECK WITH LAYERED CLOSURE;  Surgeon: Irene Limbo, MD;  Location: [place];  Service: Plastics;  Laterality: N/A;    There were  no vitals filed for this visit.   Subjective Assessment - 09/19/20 1425    Subjective Pt presents with wife today. Wife reports that he has been walking quite often at home on his own room to room which pt had not reported prior.    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 No/denies    Pain Onset More than a month ago              [facility] PT Assessment - 09/19/20 1224      Standardized Balance Assessment   Standardized Balance Assessment Timed Up and Go Test      Timed Up and Go Test   Normal TUG (seconds) 84    TUG Comments 1 min, 30 s x first trial, 1 min 18 s x second trial                         [facility] Adult PT Treatment/Exercise - 09/19/20 1224      Transfers   Transfers Sit to Stand;Stand to [employer] Transfers    Sit to Stand 5: Supervision    Five time sit to stand comments  22.84 s from mat table with R hand    Stand to Sit 5: Supervision    Stand Pivot Transfers 5: Supervision    Stand Pivot Transfer Details (indicate cue type and reason) W/C <> mat    Transfer Cueing Pt cued on putting more weight through LLE.    Comments Pt is supervision with all transfers; only ~20% WBing on LLE      Ambulation/Gait   Ambulation/Gait Yes    Ambulation/Gait Assistance 5: Supervision;4: Min guard    Ambulation/Gait Assistance Details Pt cued to stand tall and more weight-bearing on LLE.    Ambulation Distance (Feet) 115 Feet   115' initially, +33' for gait speed, +40' for TUGx2   Assistive device Large base quad cane    Gait Pattern Step-to pattern;Decreased stance time - left;Decreased weight shift to left;Decreased hip/knee flexion - left    Ambulation Surface Level;Indoor                  PT Education - 09/19/20 1332    Education Details Pt and wife educated on POC and progress towards goals. PT educated on safety with  ambulation at home. Pt still needs at least supervision for safety.    Person(s) Educated Patient;Spouse    Methods Explanation;Demonstration    Comprehension Verbalized understanding           09/19/20 1355  PT SHORT TERM GOAL #1  Title Pt will be supervision with all transfers for improved mobility.  Baseline supervsion with sit to stand, CGA with stand/pivot; supervision with sit<>stand and stand-pivot <>W/C 09/19/20  Time 4  Period Weeks  Status Achieved  Target Date 08/24/20  PT SHORT TERM GOAL #2  Title Pt will decrease 5 x sit to stand from 28.46 sec to <24 sec for improved functional strength and balance.  Baseline 28.46 sec from mat with hand on 07/25/20, 08/22/20 28 sec; 22.84 s from mat table with R hand support 09/19/20  Time 4  Period Weeks  Status Achieved  Target Date 08/24/20  PT SHORT TERM GOAL #3  Title Pt will ambulate >150' with large based quad cane min assist for improved mobility.  Baseline 100' with large based quad cane min assist/mod assist. 08/22/20 140' min assist; 115' supervision 09/19/20  Time 4  Period Weeks  Status Partially Met  Target Date 08/24/20   Updated Goals  PT Short Term Goals - 09/19/20 1430      PT SHORT TERM GOAL #1   Title STG=LTGs             PT Long Term Goals - 09/19/20 1357      PT LONG TERM GOAL #1   Title Pt will increase gait speed from 0.67ms to >0.228m for improved gait safety. (LTGs due 10/19/20)    Baseline 0.1415mwith LBQC on 09/19/20    Time 4    Period Weeks    Status On-going    Target Date 10/19/20      PT LONG TERM GOAL #2   Title Pt will be able to demonstrate increased left weight shift to >40% of weight during sit to stand.    Baseline currently ~20% 09/19/20    Time 4    Period Weeks    Status On-going    Target Date 10/19/20      PT LONG TERM GOAL #3   Title Pt will ambulate >200' with LRAD CGA for improved mobility.    Baseline 115' with large based quad cane with sup/CGA 09/19/20     Time 4    Period Weeks    Status On-going    Target Date 10/19/20      PT LONG TERM GOAL #4   Title Pt will decrease TUG to <1 min for improved turning and improved gait speed.    Baseline 1 min, 18 s best [facility] 09/19/20    Time 4    Period Weeks    Status New    Target Date 10/19/20                 Plan - 09/19/20 1339    Clinical Impression Statement Today's skill session focused on reassessment all ST and LT goals for recert. Pt demos ability to complete all functional transfers with supervision. Pt improved on 5x sit to stand and met goal of <24 s buy scoring 22.84 s from mat table using R hand for support. Pt's gait speed remained the same (0.14 m/s) and he is still not able to ambulate >150' without stopping to rest. Pt is only weight-bearing approximately 20% through LLE during STS transfers. Pt was challenged in the TUG for additional of new goals in which he scored 90 s on first trial and 78 s on second trial for an average of 84 s. Pt with difficulty turning during TUG. PT to incorporate sharp turning and cone negotiation into POC. PT to recert pt for 1x/week x 4 weeks to continue working on gait tolerance, increasing WBing through LLE, gait speed, and standing balance.    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 08/20/19 w aspiration 08/21/19, (tx empircally w vanco/ cefepime), LLE DVT 07/21/19 and small left lower lung PE 08/01/19 s/p IVC filter 07/21/19, iv heparin discontinued due to worsening ICH 08/14/19, and abdominal wall hematoma, seizure, pseudomonas uti,    Examination-Activity Limitations Bathing;Sit;Bed Mobility;Bend;Squat;Caring for  Others;Stairs;Stand;Carry;Toileting;Dressing;Transfers;Hygiene/Grooming;Locomotion Level    Examination-Participation Restrictions Driving;Other;Meal Prep    Rehab Potential Good    PT Frequency 1x / week    PT Duration 4 weeks    PT Treatment/Interventions ADLs/Self Care Home Management;Aquatic Therapy;Biofeedback;Balance training;Therapeutic exercise;Therapeutic activities;Functional mobility training;Wheelchair mobility training;Orthotic Fit/Training;Stair training;Gait training;DME Instruction;Patient/family education;Cognitive remediation;Neuromuscular re-education;Vestibular;Manual techniques;Passive range of motion;Cryotherapy;Moist Heat    PT Next Visit Plan Continue with gait training with LBQC. Work on gait maneuvering around obstacles and tighter quarters to mimic home. Activities to promote increased left weight shift. Stair negotiation with R rail and step to sequence. Add left hip flexor stretch for home.    Consulted and Agree with Plan of Care Patient    Family Member Consulted wife: Melene Muller           Patient will benefit from skilled therapeutic intervention in order to improve the following deficits and impairments:  Abnormal gait, Difficulty walking, Decreased mobility, Decreased balance, Decreased cognition, Decreased range of motion, Decreased coordination, Decreased safety awareness, Impaired flexibility, Postural dysfunction, Decreased knowledge of use of DME, Decreased strength, Impaired UE functional use, Pain, Impaired tone, Impaired sensation, Decreased endurance  Visit Diagnosis: Other abnormalities of gait and mobility  Muscle weakness (generalized)  Hemiplegia and hemiparesis following cerebral infarction affecting left non-dominant side (HCC)  Unsteadiness on feet     Problem List Patient Active Problem List   Diagnosis Date Noted  . S/P craniotomy 06/05/2020  . History of cranioplasty 06/05/2020  . Peri-rectal abscess 02/14/2020  . Abnormal CT scan,  pelvis 02/14/2020  . Pancytopenia ([place]) 12/02/2019  . Nephrolithiasis 12/02/2019  . Hydronephrosis with renal and ureteral calculus obstruction 12/02/2019  . Rectal pain 12/02/2019  . Hyperkalemia 12/02/2019  . Reactive depression   . Wound infection after surgery   . Sleep disturbance   . Dysphagia, post-stroke   . Transaminitis   . 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 Cerebral abscess   . Urinary tract infection without hematuria   . Altered mental status   . Primary hypercoagulable state ([place])   . Acute pulmonary embolism without acute cor pulmonale (HCC)   . Deep vein thrombosis (DVT) of non-extremity vein   . Hypokalemia   . Acute blood loss anemia   . Leukocytosis   . Endotracheal tube present   . Acute respiratory failure with hypoxemia ([place])   . Stroke (cerebrum) ([place]) 07/19/2019  . Pressure injury of skin 07/19/2019  . Acute CVA (cerebrovascular accident) ([place])   . Encephalopathy   . Dysphagia   . Acute encephalopathy   . Essential hypertension   . Obesity 03/12/2016  . Scalp abscess 12/20/2015  . Neck abscess 12/20/2015  . Pilonidal cyst 02/08/2013     Rosalita Levan, SPT 09/19/2020, 2:32 PM  [facility] [address] [place], [place], [address] Phone: [phone]   Fax:  [phone]  Name: LYSLE YERO MRN: [id number] Date of Birth: [birth year]   Managed medicaid CPT codes: 99242- Therapeutic Exercise, [phone]- Neuro Re-education, [phone] - Gait Training, [phone] - Manual Therapy, [phone] -  Therapeutic Activities and [address] - Self Care

## 2020-09-26 ENCOUNTER — Encounter: Payer: Self-pay | Admitting: Physical Medicine and Rehabilitation

## 2020-09-26 ENCOUNTER — Encounter
Payer: Medicaid Other | Attending: Physical Medicine and Rehabilitation | Admitting: Physical Medicine and Rehabilitation

## 2020-09-26 ENCOUNTER — Other Ambulatory Visit: Payer: Self-pay

## 2020-09-26 VITALS — BP 128/85 | HR 61 | Temp 98.2°F | Ht 72.0 in | Wt 225.0 lb

## 2020-09-26 DIAGNOSIS — I69352 Hemiplegia and hemiparesis following cerebral infarction affecting left dominant side: Secondary | ICD-10-CM

## 2020-09-26 NOTE — Progress Notes (Signed)
Subjective:    Patient ID: Matthew Black, male    DOB: [birth year], 44 y.o.   MRN: [id number]  HPI  Washing dishes and drying them.  Kidney stones resolved following procedure.  IVC filter to be removed soon. He enjoys cooking, working on stairs. He likes to cook spaghetti and eggs. He is able to walk around the the home. He is able to walk outside as well and wife says that he often walks in front of her.  He plays Scrabble with his kids and enjoys spending time with them.  Has started drinking 1 coffee every morning.   He asks about alcohol use. Has been drinking wine, rum.  Wife states that tightness in elbow and knee flexors has been better and improves the more he walks.  Has sense of humor back.  Headaches have resolved.  He has been driving with his parents next to him and has been doing a great job. No issues except when he tries to get out of the car as he needs he AD at this time. His wife asks about a platform walker for him.   Prior history: Mr. Neisen is a a 44 year old man who presents for hospital follow-up after right middle cerebral artery stroke. His wife accompanies him and provides most of the history.  No longer has pain at surgical site. Wife asks if he should have repeat CT scan. No longer has headache.   Has been having some right sided abdominal pain. Has kidney stone. Has been having daily BM but they are hard. Also has gas.   He has completed his PT and is able to ambulate around his home.   His wife says that prior to his stroke, he liked to play basketball with their kids (ages 57 and 61), play with their dog, travel, and relax at home. He has told her that he is happy to be at home.  He has had tightness in his right elbow and knee flexors and this has been painful to him. He has had two falls and his leg stiffness may have contributed to this.   Pain Inventory Average Pain 0 Pain Right Now 0 My pain is no pain  In the last 24 hours, has pain  interfered with the following? General activity 6 Relation with others 10 Enjoyment of life 10 What TIME of day is your pain at its worst? no pain Sleep (in general) Good  Pain is worse with: no pain Pain improves with: na Relief from Meds: no pain     Family History  Problem Relation Age of Onset  . Healthy Mother   . Healthy Father   . Clotting disorder Maternal Grandmother   . Diabetes Paternal Grandfather   . Clotting disorder Maternal Aunt   . Clotting disorder Maternal Uncle   . Colon cancer Neg Hx    Social History   Socioeconomic History  . Marital status: Married    Spouse name: Not on file  . Number of children: 2  . Years of education: Not on file  . Highest education level: Not on file  Occupational History  . Occupation: Clinical biochemist   Tobacco Use  . Smoking status: Former Smoker    Packs/day: 0.00    Quit date: 11/24/2015    Years since quitting: 4.8  . Smokeless tobacco: Never Used  . Tobacco comment:    Vaping Use  . Vaping Use: Never used  Substance and Sexual Activity  . Alcohol  use: Not Currently  . Drug use: Never  . Sexual activity: Not Currently  Other Topics Concern  . Not on file  Social History Narrative   Household-- pt, wife, 2 children   Social Determinants of Health   Financial Resource Strain:   . Difficulty of Paying Living Expenses: Not on file  Food Insecurity:   . Worried About Charity fundraiser in the Last Year: Not on file  . Ran Out of Food in the Last Year: Not on file  Transportation Needs:   . Lack of Transportation (Medical): Not on file  . Lack of Transportation (Non-Medical): Not on file  Physical Activity:   . Days of Exercise per Week: Not on file  . Minutes of Exercise per Session: Not on file  Stress:   . Feeling of Stress : Not on file  Social Connections:   . Frequency of Communication with Friends and Family: Not on file  . Frequency of Social Gatherings with Friends and Family: Not on file   . Attends Religious Services: Not on file  . Active Member of Clubs or Organizations: Not on file  . Attends Archivist Meetings: Not on file  . Marital Status: Not on file   Past Surgical History:  Procedure Laterality Date  . CRANIOPLASTY Right 06/05/2020   Procedure: CRANIOPLASTY;  Surgeon: Consuella Lose, MD;  Location: [place];  Service: Neurosurgery;  Laterality: Right;  right  . CRANIOTOMY Right 07/19/2019   Procedure: RIGHT HEMI-CRANIECTOMY With implantation of skull flap to abdominal wall;  Surgeon: Consuella Lose, MD;  Location: [place];  Service: Neurosurgery;  Laterality: Right;  . CYST EXCISION N/A 10/08/2016   Procedure: EXCISION OF POSTERIOR NECK CYST;  Surgeon: Clovis Riley, MD;  [facility];  Service: General;  Laterality: N/A;  . CYSTOSCOPY/URETEROSCOPY/HOLMIUM LASER/STENT PLACEMENT Right 03/16/2020   Procedure: CYSTOSCOPY RIGHT RETROGRADE PYELOGRAM URETEROSCOPY/HOLMIUM LASER/STENT PLACEMENT;  Surgeon: Lucas Mallow, MD;  [facility];  Service: Urology;  Laterality: Right;  . INCISION AND DRAINAGE ABSCESS N/A 09/22/2014   Procedure: INCISION AND DRAINAGE ABSCESS POSTERIOR NECK;  Surgeon: Pedro Earls, MD;  [facility];  Service: General;  Laterality: N/A;  . INCISION AND DRAINAGE ABSCESS N/A 12/20/2015   Procedure: INCISION AND DRAINAGE POSTERIOR NECK MASS;  Surgeon: Armandina Gemma, MD;  [facility];  Service: General;  Laterality: N/A;  . INCISION AND DRAINAGE ABSCESS Left 07/10/2004   middle finger  . IR IVC FILTER PLMT / S&I /IMG GUID/MOD SED  07/21/2019  . IR RADIOLOGIST EVAL & MGMT  12/14/2019  . IR VENOGRAM RENAL UNI RIGHT  07/21/2019  . MASS EXCISION N/A 07/21/2017   Procedure: EXCISION OF BENIGN NECK LESION WITH LAYERED CLOSURE;  Surgeon: Irene Limbo, MD;  Location: [place];  Service: Plastics;  Laterality: N/A;  . MASS EXCISION N/A 11/10/2017   Procedure: EXCISION BENIGN LESION OF THE NECK WITH LAYERED  CLOSURE;  Surgeon: Irene Limbo, MD;  Location: [place];  Service: Plastics;  Laterality: N/A;   Past Medical History:  Diagnosis Date  . 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 Depression   . DVT (deep venous thrombosis) ([place])    BLE DVT 07/21/19, 08/02/19; s/p retrievable IVC filter 07/21/19  . Hemorrhagic stroke ([place])   . History of kidney stones   . Hypertension   . 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 PE (pulmonary thromboembolism) ([place])    08/01/19 non-occlusieve left posterior lower lobe  segmental artery PE  . Stroke ([place])    RICA, R A1, R MCA occlusion 07/19/19   BP 128/85   Pulse 61   Temp 98.2 F (36.8 C)   Ht 6' (1.829 m)   Wt 225 lb (102.1 kg) Comment: last recorded  SpO2 97%   BMI 30.52 kg/m   Opioid Risk Score:   Fall Risk Score:  `1  Depression screen PHQ 2/9  Depression screen [facility] 2/9 09/26/2020 03/08/2020 09/11/2016 03/11/2016  Decreased Interest 0 3 0 0  Down, Depressed, Hopeless 0 3 0 0  PHQ - 2 Score 0 6 0 0  Some recent data might be hidden     Review of Systems  Constitutional: Negative.   HENT: Negative.   Eyes: Negative.   Respiratory: Negative.   Cardiovascular: Negative.   Gastrointestinal: Negative.   Genitourinary: Negative.   Musculoskeletal: Positive for gait problem.  Skin: Negative.   Allergic/Immunologic: Negative.   Neurological: Positive for weakness.  Hematological: Negative.   Psychiatric/Behavioral: Positive for dysphoric mood.  All other systems reviewed and are negative.      Objective:   Physical Exam Gen: no distress, normal appearing HEENT: oral mucosa pink and moist, NCAT Cardio: Reg rate Chest: normal effort, normal rate of breathing Abd: soft, non-distended Ext: no edema Skin: Cranial incision has healed very well! Hair growing over nicely.  Psych:Much better communication and bright affect! Was upset and leaned over when I discussed that return to driving would be a slow process.  Musc: No edema in  extremities. No tenderness in extremities. Neuro: 0/5 LUE except for 1/5 SA. LLE with 3/5 HF, KE, 0/5 DF and PF. Left inattention persists, tone 2 LUE and LLE elbow and knee flexors RUE/RLE appear to be 5/5 proximal distal--stable     Assessment & Plan:  Mr. Winemiller is a a 44 year old man who presents for follow-up following his right middle cerebral artery stroke. His wife accompanies him and provides most of the history.  Headache -Continues to be resolved. Advised that he does not required repeat CT Head unless he develops symptoms and signs of infection or bleed--discussed which to look out for.   Abdominal pain: -Resolved following surgical removal of kidney stones  Weakness/spasticity: -Discussed Botox as treatment for his right sided spasticity- will target biceps, finger flexors, knee flexors, thumb adductor, thumb flexor -Discussed transcranial magnetic stimulation as a an option to improve muscle strength. Also discussed Sprint PNS as an option. Provided links and phones numbers for both options for wife to investigate further.  -Made goal to walk outside 15 minutes per day, start resistance training with low weights.  -Provided script for platform walker -Provided script for occupational therapy.   Return to driving: -Once parents clear him after driving next to him > times he may drive on his own.   Cranial incision healing well.   Quality of life: Discussed spending time with his kids, playing games to keep his mind sharp.   All questions were encouraged and answered. Follow up with me in 6 weeks.

## 2020-10-01 ENCOUNTER — Telehealth: Payer: Self-pay | Admitting: Physical Medicine and Rehabilitation

## 2020-10-01 NOTE — Telephone Encounter (Signed)
Would like to pick up form for Handicap placard. Would it be possible to get that tomorrow when you are in office?  Please advise.  Thanks.

## 2020-10-03 ENCOUNTER — Ambulatory Visit: Payer: Medicaid Other | Attending: Physician Assistant

## 2020-10-03 ENCOUNTER — Other Ambulatory Visit: Payer: Self-pay

## 2020-10-03 DIAGNOSIS — R2689 Other abnormalities of gait and mobility: Secondary | ICD-10-CM | POA: Diagnosis not present

## 2020-10-03 DIAGNOSIS — I69354 Hemiplegia and hemiparesis following cerebral infarction affecting left non-dominant side: Secondary | ICD-10-CM | POA: Diagnosis not present

## 2020-10-03 DIAGNOSIS — R2681 Unsteadiness on feet: Secondary | ICD-10-CM | POA: Diagnosis not present

## 2020-10-03 DIAGNOSIS — M6281 Muscle weakness (generalized): Secondary | ICD-10-CM | POA: Diagnosis not present

## 2020-10-03 NOTE — Therapy (Signed)
[facility] [address] [place], [place], [address] Phone: [phone]   Fax:  [phone]  Physical Therapy Treatment  Patient Details  Name: Matthew Black MRN: [id number] Date of Birth: June 06, 1976 Referring Provider (PT): Dr. Naaman Plummer but f/u with Dr. Ranell Patrick   Encounter Date: 10/03/2020   PT End of Session - 10/03/20 1309    Visit Number 23    Number of Visits 26    Date for PT Re-Evaluation 11/18/20    Authorization Type 8 visits 9/8 to 10/27, auth pending    Authorization - Visit Number --   pt's initial MCD auth for 3 visits met on 01/19/20, new auth for 12 visits (02/02/20-03/28/20   Authorization - Number of Visits --    PT Start Time 1100    PT Stop Time 1144    PT Time Calculation (min) 44 min    Equipment Utilized During Treatment Gait belt    Activity Tolerance Patient tolerated treatment well    Behavior During Therapy [facility] for tasks assessed/performed           Past Medical History:  Diagnosis Date  . 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 Depression   . DVT (deep venous thrombosis) ([place])    BLE DVT 07/21/19, 08/02/19; s/p retrievable IVC filter 07/21/19  . Hemorrhagic stroke ([place])   . History of kidney stones   . Hypertension   . 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 PE (pulmonary thromboembolism) ([place])    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  . Stroke [facility])    RICA, R A1, R MCA occlusion 07/19/19    Past Surgical History:  Procedure Laterality Date  . CRANIOPLASTY Right 06/05/2020   Procedure: CRANIOPLASTY;  Surgeon: Consuella Lose, MD;  Location: [facility];  Service: Neurosurgery;  Laterality: Right;  right  . CRANIOTOMY Right 07/19/2019   Procedure: RIGHT HEMI-CRANIECTOMY With implantation of skull flap to abdominal wall;  Surgeon: Consuella Lose, MD;  Location: [facility];  Service: Neurosurgery;  Laterality: Right;  . CYST EXCISION N/A 10/08/2016   Procedure: EXCISION OF  POSTERIOR NECK CYST;  Surgeon: Clovis Riley, MD;  Location: [facility];  Service: General;  Laterality: N/A;  . CYSTOSCOPY/URETEROSCOPY/HOLMIUM LASER/STENT PLACEMENT Right 03/16/2020   Procedure: CYSTOSCOPY RIGHT RETROGRADE PYELOGRAM URETEROSCOPY/HOLMIUM LASER/STENT PLACEMENT;  Surgeon: Lucas Mallow, MD;  Location: [facility];  Service: Urology;  Laterality: Right;  . INCISION AND DRAINAGE ABSCESS N/A 09/22/2014   Procedure: INCISION AND DRAINAGE ABSCESS POSTERIOR NECK;  Surgeon: Pedro Earls, MD;  Location: [facility];  Service: General;  Laterality: N/A;  . INCISION AND DRAINAGE ABSCESS N/A 12/20/2015   Procedure: INCISION AND DRAINAGE POSTERIOR NECK MASS;  Surgeon: Armandina Gemma, MD;  Location: [facility];  Service: General;  Laterality: N/A;  . INCISION AND DRAINAGE ABSCESS Left 07/10/2004   middle finger  . IR IVC FILTER PLMT / S&I /IMG GUID/MOD SED  07/21/2019  . IR RADIOLOGIST EVAL & MGMT  12/14/2019  . IR VENOGRAM RENAL UNI RIGHT  07/21/2019  . MASS EXCISION N/A 07/21/2017   Procedure: EXCISION OF BENIGN NECK LESION WITH LAYERED CLOSURE;  Surgeon: Irene Limbo, MD;  Location: [facility];  Service: Plastics;  Laterality: N/A;  . MASS EXCISION N/A 11/10/2017   Procedure: EXCISION BENIGN LESION OF THE NECK WITH LAYERED CLOSURE;  Surgeon: Irene Limbo, MD;  Location: [facility];  Service: Plastics;  Laterality: N/A;    There were  no vitals filed for this visit.   Subjective Assessment - 10/03/20 1103    Subjective Pt reports a close friend of his passed away recently from stroke, pt is thankful to still be her today. No more falls. Pt reports walking with wife/FIL.    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 No/denies    Pain Onset More than a month ago                             [facility] Adult PT Treatment/Exercise - 10/03/20 1132      Transfers   Transfers Sit to Stand;Stand to Sit      Ambulation/Gait   Ambulation/Gait Yes    Ambulation/Gait Assistance 4: Min guard    Ambulation/Gait Assistance Details Pt cued on trying to put some weight through LLE.     Ambulation Distance (Feet) 115 Feet    Assistive device Large base quad cane    Gait Pattern Step-to pattern;Decreased stance time - left;Decreased weight shift to left;Decreased hip/knee flexion - left    Ambulation Surface Level;Indoor      Neuro Re-ed    Neuro Re-ed Details  in // bars: Pt instructed in forward stepping and side stepping. With side stepping, pt cued to take big lateral step with RLE in order to promote increased WBing on LLE. Pt also instructed in weight shifting side to side without UE support while looking in mirror to help with proper posture and weight-shifting. Pt also instructed in R arm raises to promote standing balance without UE support and promote increased WBing on LLE. Pt also instructed in cone negotiation x8 with x2 on red mat to mimic home environment and practice obstacle navigation and turning in tight spaces. Red mat used to mimic rugs at home.  Pt demos ability to complete x2 trials with CGA. Min assist at times with activities when no UE support involved.                    PT Short Term Goals - 09/19/20 1430      PT SHORT TERM GOAL #1   Title STG=LTGs              PT Long Term Goals - 09/19/20 1357      PT LONG TERM GOAL #1   Title Pt will increase gait speed from 0.13m/s to >0.49m/s for improved gait safety. (LTGs due 10/19/20)    Baseline 0.58m/s with [facility] on 09/19/20    Time 4    Period Weeks    Status On-going    Target Date 10/19/20      PT LONG TERM GOAL #2   Title Pt will be able to demonstrate increased left weight shift to >40% of weight during sit to stand.    Baseline  currently ~20% 09/19/20    Time 4    Period Weeks    Status On-going    Target Date 10/19/20      PT LONG TERM GOAL #3   Title Pt will ambulate >200' with LRAD CGA for improved mobility.    Baseline 115' with large based quad cane with sup/CGA 09/19/20    Time 4    Period Weeks    Status On-going    Target Date 10/19/20      PT LONG TERM GOAL #4   Title Pt will decrease TUG to <1 min for improved turning and improved gait speed.    Baseline 1 min, 18 s best trial with [facility] on 09/19/20    Time 4    Period Weeks    Status New    Target Date 10/19/20                 Plan - 10/03/20 1310    Clinical Impression Statement Today's skilled PT session focused on continuing to promote increased WBing on LLE, continued gait training with [facility] and practicing navigating obstacles (on compliant and non-compliant surfaces)- and tight quarters to mimic home environment. PT to continue to progress towads remaining goals.    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 08/20/19 w aspiration 08/21/19, (tx empircally w vanco/ cefepime), LLE DVT 07/21/19 and small left lower lung PE 08/01/19 s/p IVC filter 07/21/19, iv heparin discontinued due to worsening ICH 08/14/19, and abdominal wall  hematoma, seizure, pseudomonas uti,    Examination-Activity Limitations Bathing;Sit;Bed Mobility;Bend;Squat;Caring for Others;Stairs;Stand;Carry;Toileting;Dressing;Transfers;Hygiene/Grooming;Locomotion Level    Examination-Participation Restrictions Driving;Other;Meal Prep    Rehab Potential Good    PT Frequency 1x / week    PT Duration 4 weeks    PT Treatment/Interventions ADLs/Self Care Home Management;Aquatic Therapy;Biofeedback;Balance training;Therapeutic exercise;Therapeutic activities;Functional mobility training;Wheelchair mobility training;Orthotic Fit/Training;Stair training;Gait training;DME Instruction;Patient/family education;Cognitive remediation;Neuromuscular re-education;Vestibular;Manual techniques;Passive range of motion;Cryotherapy;Moist Heat    PT Next Visit Plan Continue with gait training with [facility].  Stair negotiation with R rail and step to sequence. Activities to promote increased left weight shift.Add left hip flexor stretch for home.    Consulted and Agree with Plan of Care Patient    Family Member Consulted wife: Melene Muller           Patient will benefit from skilled therapeutic intervention in order to improve the following deficits and impairments:  Abnormal gait, Difficulty walking, Decreased mobility, Decreased balance, Decreased cognition, Decreased range of motion, Decreased coordination, Decreased safety awareness, Impaired flexibility, Postural dysfunction, Decreased knowledge of use of DME, Decreased strength, Impaired UE functional use, Pain, Impaired tone, Impaired sensation, Decreased endurance  Visit Diagnosis: Other abnormalities of gait and mobility  Muscle weakness (generalized)  Hemiplegia and hemiparesis following cerebral infarction affecting left non-dominant side (HCC)  Unsteadiness on feet     Problem List Patient Active Problem List   Diagnosis Date Noted  . S/P craniotomy 06/05/2020  . History of cranioplasty 06/05/2020  . Peri-rectal  abscess 02/14/2020  . Abnormal CT scan, pelvis 02/14/2020  . Pancytopenia ([place]) 12/02/2019  . Nephrolithiasis 12/02/2019  . Hydronephrosis with renal and ureteral calculus obstruction 12/02/2019  . Rectal pain 12/02/2019  . Hyperkalemia 12/02/2019  . Reactive depression   . Wound infection after surgery   .  Sleep disturbance   . Dysphagia, post-stroke   . Transaminitis   . 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 Cerebral abscess   . Urinary tract infection without hematuria   . Altered mental status   . Primary hypercoagulable state ([place])   . Acute pulmonary embolism without acute cor pulmonale (HCC)   . Deep vein thrombosis (DVT) of non-extremity vein   . Hypokalemia   . Acute blood loss anemia   . Leukocytosis   . Endotracheal tube present   . Acute respiratory failure with hypoxemia ([facility])   . Stroke (cerebrum) ([place]) 07/19/2019  . Pressure injury of skin 07/19/2019  . Acute CVA (cerebrovascular accident) ([place])   . Encephalopathy   . Dysphagia   . Acute encephalopathy   . Essential hypertension   . Obesity 03/12/2016  . Scalp abscess 12/20/2015  . Neck abscess 12/20/2015  . Pilonidal cyst 02/08/2013    Rosalita Levan, SPT 10/03/2020, 2:42 PM  [facility] [address] [place], [place], [address] Phone: [phone]   Fax:  [phone]  Name: Matthew Black MRN: [id number] Date of Birth: 04-25-1976

## 2020-10-10 ENCOUNTER — Other Ambulatory Visit: Payer: Self-pay

## 2020-10-10 DIAGNOSIS — I10 Essential (primary) hypertension: Secondary | ICD-10-CM

## 2020-10-10 MED ORDER — METOPROLOL TARTRATE 50 MG PO TABS: 50.0000 mg | ORAL_TABLET | Freq: Two times a day (BID) | ORAL | 0 refills | Status: DC

## 2020-10-11 ENCOUNTER — Encounter: Payer: Self-pay | Admitting: Physical Therapy

## 2020-10-11 ENCOUNTER — Ambulatory Visit: Payer: Medicaid Other | Admitting: Physical Medicine and Rehabilitation

## 2020-10-11 ENCOUNTER — Other Ambulatory Visit: Payer: Self-pay

## 2020-10-11 ENCOUNTER — Ambulatory Visit: Payer: Medicaid Other | Admitting: Physical Therapy

## 2020-10-11 VITALS — BP 114/82 | HR 73

## 2020-10-11 DIAGNOSIS — I69354 Hemiplegia and hemiparesis following cerebral infarction affecting left non-dominant side: Secondary | ICD-10-CM | POA: Diagnosis not present

## 2020-10-11 DIAGNOSIS — R2689 Other abnormalities of gait and mobility: Secondary | ICD-10-CM | POA: Diagnosis not present

## 2020-10-11 DIAGNOSIS — R2681 Unsteadiness on feet: Secondary | ICD-10-CM

## 2020-10-11 DIAGNOSIS — M6281 Muscle weakness (generalized): Secondary | ICD-10-CM

## 2020-10-11 NOTE — Therapy (Signed)
[facility] [address] [place], [place], [address] Phone: [phone]   Fax:  [phone]  Physical Therapy Treatment  Patient Details  Name: Matthew Black MRN: [id number] Date of Birth: 04-30-1976 Referring Provider (PT): Dr. Naaman Plummer but f/u with Dr. Ranell Patrick   Encounter Date: 10/11/2020   PT End of Session - 10/11/20 1637    Visit Number 24    Number of Visits 28    Date for PT Re-Evaluation 11/14/20    Authorization Type 4 Visits approved 11/18 - 12/22    Authorization - Visit Number 1   pt's initial MCD auth for 3 visits met on 01/19/20, new auth for 12 visits (02/02/20-03/28/20   Authorization - Number of Visits 4    PT Start Time 1238    PT Stop Time 1320    PT Time Calculation (min) 42 min    Equipment Utilized During Treatment Other (comment)   Upwalker   Activity Tolerance Patient tolerated treatment well    Behavior During Therapy [facility] for tasks assessed/performed           Past Medical History:  Diagnosis Date  . 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 Depression   . DVT (deep venous thrombosis) ([place])    BLE DVT 07/21/19, 08/02/19; s/p retrievable IVC filter 07/21/19  . Hemorrhagic stroke ([place])   . History of kidney stones   . Hypertension   . 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 PE (pulmonary thromboembolism) ([place])    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  . Stroke [facility])    RICA, R A1, R MCA occlusion 07/19/19    Past Surgical History:  Procedure Laterality Date  . CRANIOPLASTY Right 06/05/2020   Procedure: CRANIOPLASTY;  Surgeon: Consuella Lose, MD;  Location: [place] [facility];  Service: Neurosurgery;  Laterality: Right;  right  . CRANIOTOMY Right 07/19/2019   Procedure: RIGHT HEMI-CRANIECTOMY With implantation of skull flap to abdominal wall;  Surgeon: Consuella Lose, MD;  Location: [place];  Service: Neurosurgery;  Laterality: Right;  . CYST EXCISION N/A 10/08/2016   Procedure:  EXCISION OF POSTERIOR NECK CYST;  Surgeon: Clovis Riley, MD;  Location: [facility];  Service: General;  Laterality: N/A;  . CYSTOSCOPY/URETEROSCOPY/HOLMIUM LASER/STENT PLACEMENT Right 03/16/2020   Procedure: CYSTOSCOPY RIGHT RETROGRADE PYELOGRAM URETEROSCOPY/HOLMIUM LASER/STENT PLACEMENT;  Surgeon: Lucas Mallow, MD;  Location: [facility];  Service: Urology;  Laterality: Right;  . INCISION AND DRAINAGE ABSCESS N/A 09/22/2014   Procedure: INCISION AND DRAINAGE ABSCESS POSTERIOR NECK;  Surgeon: Pedro Earls, MD;  Location: [facility];  Service: General;  Laterality: N/A;  . INCISION AND DRAINAGE ABSCESS N/A 12/20/2015   Procedure: INCISION AND DRAINAGE POSTERIOR NECK MASS;  Surgeon: Armandina Gemma, MD;  Location: [facility];  Service: General;  Laterality: N/A;  . INCISION AND DRAINAGE ABSCESS Left 07/10/2004   middle finger  . IR IVC FILTER PLMT / S&I /IMG GUID/MOD SED  07/21/2019  . IR RADIOLOGIST EVAL & MGMT  12/14/2019  . IR VENOGRAM RENAL UNI RIGHT  07/21/2019  . MASS EXCISION N/A 07/21/2017   Procedure: EXCISION OF BENIGN NECK LESION WITH LAYERED CLOSURE;  Surgeon: Irene Limbo, MD;  Location: [facility];  Service: Plastics;  Laterality: N/A;  . MASS EXCISION N/A 11/10/2017   Procedure: EXCISION BENIGN LESION OF THE NECK WITH LAYERED CLOSURE;  Surgeon: Irene Limbo, MD;  Location: [place];  Service: Plastics;  Laterality: N/A;    Vitals:  10/11/20 1249  BP: 114/82  Pulse: 73     Subjective Assessment - 10/11/20 1245    Subjective Went to see physician who wrote an order for OT; also recommended a walker with a platform.  Pt going to medical supply store today to get a new w/c - will get a transport w/c.    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 No/denies    Pain Onset More than a month ago                             [facility] Adult PT Treatment/Exercise - 10/11/20 1616      Bed Mobility   Bed Mobility Rolling Right;Right Sidelying to Sit;Sit to Supine    Rolling Right Minimal Assistance - Patient > 75%    Right Sidelying to Sit Minimal Assistance - Patient > 75%    Sit to Supine Minimal Assistance - Patient > 75%      Transfers   Transfers Sit to Stand;Stand to Sit;Squat Pivot Transfers    Sit to Stand 4: Min guard    Sit to Stand Details (indicate cue type and reason) Performed sit > stand with platform RW; pt continues to lean heavily on R side and demonstrates decreased weight shift and WB through LUE and LLE.    Stand to Sit 4: Min assist    Stand to Sit Details When sitting from standing with platform RW, pt required assistance of therapist for controlled descent to mat and to release platform with LUE    Squat Pivot Transfers 4: Min guard    Squat Pivot Transfer Details (indicate cue type and reason) mat > w/c to R side reaching with RUE for arm rest.  Therapist provided pt with tactile input through LLE for WB and to initiate LLE during squat pivot when pivoting to the R.      Ambulation/Gait   Ambulation/Gait Yes    Ambulation/Gait Assistance 4: Min assist    Ambulation/Gait Assistance Details Pt wanted to see and try platform RW due to physician's recommendations.  Utilized UpWalker (no platform attachments in clinic).  Pt continues to demonstrate decreased  weight shift and WB through LLE during stance phase.  During L swing phase pt demonstrated increased hip ER with trunk and pelvis rotated posteriorly; pt utilized hip adduction to advance LLE kicking wheel on L each time and wheel becoming stuck on LLE when trying to advance RW.  Pt fatigued very quickly    Ambulation Distance (Feet) 30 Feet    Assistive device Bilateral platform walker    Gait Pattern Step-to pattern;Decreased step length - left;Decreased stance time - left;Decreased hip/knee flexion - left;Decreased weight shift to left;Trunk rotated posteriorly on left;Trunk flexed    Ambulation Surface Level;Indoor      Knee/Hip Exercises: Stretches  Hip Flexor Stretch Left;5 reps;60 seconds    Hip Flexor Stretch Limitations in supine with therapist providing slow but progressive pressure to encourage hip extension to neutral; pt reporting discomfort so performed very gradually.    Other Knee/Hip Stretches In supine performed gradual L hip IR to increase ROM in hip ER muscles and to allow hip to be in more neutral position during standing and gait.      Knee/Hip Exercises: Supine   Bridges Strengthening;Both;1 set;5 reps    Bridges Limitations Attempted bilat bridges with cues to initiate through LLE first; pt only able to raise hips 1/2 - 1" off the mat    Other Supine Knee/Hip Exercises With L knee in flexion performed active hip IR after performing supine stretches; performed 1 set x 8 reps with cues to isolate LLE movement and decrease compensation with R side                  PT Education - 10/11/20 [place]    Education Details educated on approval of 4 visits, one more visit added.  Educated pt and wife that OT would most likely start next year due to visits exhausted for this year.  Educated wife on safety issues with platform RW and recommended they not purchase right now.  Will continue to discuss with primary PT.    Person(s) Educated Patient;Spouse    Methods Explanation     Comprehension Verbalized understanding            PT Short Term Goals - 09/19/20 1430      PT SHORT TERM GOAL #1   Title STG=LTGs             PT Long Term Goals - 10/11/20 1648      PT LONG TERM GOAL #1   Title Pt will increase gait speed from 0.39ms to >0.231m for improved gait safety.    Baseline 0.1492mwith [facility] on 09/19/20    Time 4    Period Weeks    Status On-going    Target Date 11/14/20      PT LONG TERM GOAL #2   Title Pt will be able to demonstrate increased left weight shift to >40% of weight during sit to stand.    Baseline currently ~20% 09/19/20    Time 4    Period Weeks    Status On-going    Target Date 11/14/20      PT LONG TERM GOAL #3   Title Pt will ambulate >200' with LRAD CGA for improved mobility.    Baseline 115' with large based quad cane with sup/CGA 09/19/20    Time 4    Period Weeks    Status On-going    Target Date 11/14/20      PT LONG TERM GOAL #4   Title Pt will decrease TUG to <1 min for improved turning and improved gait speed.    [facility] 1 min, [facility]    Time 4    Period Weeks    Status New    Target Date 11/14/20                 Plan - 10/11/20 1638    Clinical Impression Statement Performed assessment of safety and gait with platform walker based on physician recommendations.  Did not feel pt would be safe to utilize platform RW at this time and pt fatigued quickly with RW; requested pt and wife hold on purNutritional therapist  RW at this time.  Pt continues to demonstrate significant ROM deficits in L hip extension and IR and limited activation.  Pt also continues to demonstrate decreased WB through LLE during transfers and gait.  Will continue to address and progress towards LTG.    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 08/20/19 w aspiration 08/21/19, (tx empircally w vanco/ cefepime), LLE DVT 07/21/19 and small left lower lung PE 08/01/19 s/p IVC filter 07/21/19, iv heparin discontinued due to worsening ICH 08/14/19, and abdominal wall hematoma, seizure, pseudomonas uti,    Examination-Activity Limitations Bathing;Sit;Bed Mobility;Bend;Squat;Caring for Others;Stairs;Stand;Carry;Toileting;Dressing;Transfers;Hygiene/Grooming;Locomotion Level    Examination-Participation Restrictions Driving;Other;Meal Prep    Rehab Potential Good    PT Frequency 1x / week    PT Duration 4 weeks    PT Treatment/Interventions ADLs/Self Care Home Management;Aquatic Therapy;Biofeedback;Balance training;Therapeutic exercise;Therapeutic activities;Functional mobility training;Wheelchair mobility training;Orthotic Fit/Training;Stair training;Gait training;DME Instruction;Patient/family education;Cognitive remediation;Neuromuscular re-education;Vestibular;Manual techniques;Passive range of motion;Cryotherapy;Moist Heat    PT Next Visit Plan Physician wrote new order for OT; I told them it would likely have to wait for the new year.  Physician recommended platform RW - we tried it, he kept kicking it on the L side, not recommending it.  Stretching for increased L hip IR and extension.  WB through LLE.  Continue with gait training with [facility].  Stair negotiation with R rail and step to sequence.    Consulted and Agree with Plan of Care Patient    Family Member Consulted wife: Melene Muller           Patient will benefit from skilled therapeutic intervention in order to improve the following deficits and impairments:  Abnormal gait, Difficulty walking, Decreased mobility, Decreased balance, Decreased cognition, Decreased range of motion, Decreased coordination, Decreased safety awareness, Impaired flexibility, Postural dysfunction,  Decreased knowledge of use of DME, Decreased strength, Impaired UE functional use, Pain, Impaired tone, Impaired sensation, Decreased endurance  Visit Diagnosis: Other abnormalities of gait and mobility  Muscle weakness (generalized)  Hemiplegia and hemiparesis following cerebral infarction affecting left non-dominant side (HCC)  Unsteadiness on feet     Problem List Patient Active Problem List   Diagnosis Date Noted  . S/P craniotomy 06/05/2020  . History of cranioplasty 06/05/2020  . Peri-rectal abscess 02/14/2020  . Abnormal CT scan, pelvis 02/14/2020  . Pancytopenia ([place]) 12/02/2019  . Nephrolithiasis 12/02/2019  . Hydronephrosis with renal and ureteral calculus obstruction 12/02/2019  . Rectal pain 12/02/2019  . Hyperkalemia 12/02/2019  . Reactive depression   . Wound infection after surgery   . Sleep disturbance   . Dysphagia, post-stroke   . Transaminitis   . 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 Cerebral abscess   . Urinary tract infection without hematuria   . Altered mental status   . Primary hypercoagulable state ([place])   . Acute pulmonary embolism without acute cor pulmonale (HCC)   . Deep vein thrombosis (DVT) of non-extremity vein   . Hypokalemia   . Acute blood loss anemia   . Leukocytosis   . Endotracheal tube present   . Acute respiratory failure with hypoxemia ([place])   . Stroke (cerebrum) ([place]) 07/19/2019  . Pressure injury of skin 07/19/2019  . Acute CVA (cerebrovascular accident) ([place])   . Encephalopathy   . Dysphagia   .  Acute encephalopathy   . Essential hypertension   . Obesity 03/12/2016  . Scalp abscess 12/20/2015  . Neck abscess 12/20/2015  . Pilonidal cyst 02/08/2013    Rico Junker, PT, DPT 10/11/20    4:50 PM    [facility] [address] [place], [place], [address] Phone: [phone]   Fax:  [phone]  Name: YOUSOF ALDERMAN MRN:  [id number] Date of Birth: November 14, 1976

## 2020-10-15 ENCOUNTER — Ambulatory Visit: Payer: Medicaid Other | Admitting: Physical Therapy

## 2020-10-24 ENCOUNTER — Other Ambulatory Visit: Payer: Self-pay

## 2020-10-24 ENCOUNTER — Ambulatory Visit: Payer: Medicaid Other | Attending: Physician Assistant

## 2020-10-24 DIAGNOSIS — R2689 Other abnormalities of gait and mobility: Secondary | ICD-10-CM | POA: Diagnosis not present

## 2020-10-24 DIAGNOSIS — I69354 Hemiplegia and hemiparesis following cerebral infarction affecting left non-dominant side: Secondary | ICD-10-CM | POA: Insufficient documentation

## 2020-10-24 DIAGNOSIS — M6281 Muscle weakness (generalized): Secondary | ICD-10-CM | POA: Insufficient documentation

## 2020-10-24 DIAGNOSIS — R293 Abnormal posture: Secondary | ICD-10-CM | POA: Diagnosis not present

## 2020-10-24 DIAGNOSIS — R2681 Unsteadiness on feet: Secondary | ICD-10-CM | POA: Diagnosis not present

## 2020-10-24 NOTE — Therapy (Signed)
[facility] [address] [place], [place], [address] Phone: [phone]   Fax:  [phone]  Physical Therapy Treatment  Patient Details  Name: Matthew Black MRN: [id number] Date of Birth: [birth year] Referring Provider (PT): Dr. Naaman Plummer but f/u with Dr. Ranell Patrick   Encounter Date: 10/24/2020   PT End of Session - 10/24/20 1812    Visit Number 25    Number of Visits 28    Date for PT Re-Evaluation 11/14/20    Authorization Type 4 Visits approved 11/18 - 12/22    Authorization - Visit Number 2   pt's initial MCD auth for 3 visits met on 01/19/20, new auth for 12 visits (02/02/20-03/28/20   Authorization - Number of Visits 4    PT Start Time 1102    PT Stop Time 1145    PT Time Calculation (min) 43 min    Equipment Utilized During Treatment Other (comment);Gait belt   Upwalker   Activity Tolerance Patient tolerated treatment well    Behavior During Therapy [facility] for tasks assessed/performed           Past Medical History:  Diagnosis Date  . 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 Depression   . DVT (deep venous thrombosis) ([place])    BLE DVT 07/21/19, 08/02/19; s/p retrievable IVC filter 07/21/19  . Hemorrhagic stroke ([place])   . History of kidney stones   . Hypertension   . 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 PE (pulmonary thromboembolism) ([place])    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  . Stroke [facility])    RICA, R A1, R MCA occlusion 07/19/19    Past Surgical History:  Procedure Laterality Date  . CRANIOPLASTY Right 06/05/2020   Procedure: CRANIOPLASTY;  Surgeon: Consuella Lose, MD;  Location: [place];  Service: Neurosurgery;  Laterality: Right;  right  . CRANIOTOMY Right 07/19/2019   Procedure: RIGHT HEMI-CRANIECTOMY With implantation of skull flap to abdominal wall;  Surgeon: Consuella Lose, MD;  Location: [place];  Service: Neurosurgery;  Laterality: Right;  . CYST EXCISION N/A 10/08/2016    Procedure: EXCISION OF POSTERIOR NECK CYST;  Surgeon: Clovis Riley, MD;  Location: [facility];  Service: General;  Laterality: N/A;  . CYSTOSCOPY/URETEROSCOPY/HOLMIUM LASER/STENT PLACEMENT Right 03/16/2020   Procedure: CYSTOSCOPY RIGHT RETROGRADE PYELOGRAM URETEROSCOPY/HOLMIUM LASER/STENT PLACEMENT;  Surgeon: Lucas Mallow, MD;  Location: [facility];  Service: Urology;  Laterality: Right;  . INCISION AND DRAINAGE ABSCESS N/A 09/22/2014   Procedure: INCISION AND DRAINAGE ABSCESS POSTERIOR NECK;  Surgeon: Pedro Earls, MD;  Location: [facility];  Service: General;  Laterality: N/A;  . INCISION AND DRAINAGE ABSCESS N/A 12/20/2015   Procedure: INCISION AND DRAINAGE POSTERIOR NECK MASS;  Surgeon: Armandina Gemma, MD;  Location: [facility];  Service: General;  Laterality: N/A;  . INCISION AND DRAINAGE ABSCESS Left 07/10/2004   middle finger  . IR IVC FILTER PLMT / S&I /IMG GUID/MOD SED  07/21/2019  . IR RADIOLOGIST EVAL & MGMT  12/14/2019  . IR VENOGRAM RENAL UNI RIGHT  07/21/2019  . MASS EXCISION N/A 07/21/2017   Procedure: EXCISION OF BENIGN NECK LESION WITH LAYERED CLOSURE;  Surgeon: Irene Limbo, MD;  Location: [facility];  Service: Plastics;  Laterality: N/A;  . MASS EXCISION N/A 11/10/2017   Procedure: EXCISION BENIGN LESION OF THE NECK WITH LAYERED CLOSURE;  Surgeon: Irene Limbo, MD;  Location: [place];  Service: Plastics;  Laterality: N/A;  There were no vitals filed for this visit.   Subjective Assessment - 10/24/20 1107    Subjective Pt reports having shoulder pain after using platform walker. In therapy at last visit, pt reports trying to sit down in W/C while arm was still on platform walker. Ever since, he has been having to prop arm on pilow even sitting because it hurts otherwise.    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 Yes    Pain Score 7     Pain Location Shoulder    Pain Orientation Left    Pain Descriptors / Indicators Constant;Throbbing    Pain Onset 1 to 4 weeks ago                             [facility] Adult PT Treatment/Exercise - 10/24/20 1308      Transfers   Transfers Sit to Stand;Stand to Sit;Squat Pivot Transfers    Sit to Stand 4: Min guard      Ambulation/Gait   Ambulation/Gait Yes    Ambulation/Gait Assistance 4: Min guard    Ambulation/Gait Assistance Details Pt with very short, step-through gait pattern today with PT intermittently providing support to L knee to encourage increased L WBing and increased R step length.    Ambulation Distance (Feet) 115 Feet    Assistive device Large base quad cane    Gait Pattern Step-to pattern;Decreased step length - left;Decreased stance time - left;Decreased hip/knee flexion - left;Decreased weight shift to left;Trunk rotated posteriorly on left;Trunk flexed    Ambulation Surface Level;Indoor    Stairs Yes    Stairs Assistance 3: Mod assist    Stairs Assistance Details (indicate cue type and reason) Pt with RLE leading going up and LLE going down. Pt missed first step initally going up  but was able to complete second try with mod A. Pt with good weight shifting onto LLE with stair training. Pt with min A decsending leading with LLE. Pt required mod cueing for proper sequencing. PT blocked left knee for safety.  2nd therapist CGA for additional safety.    Stair Management Technique One rail Right;Step to pattern;Forwards    Number of Stairs 4      Neuro Re-ed    Neuro Re-ed Details  in front of W/C: R stepping fwd/back on incline step with improved L WBing during last 3 reps. x10 reps with min A. PT with manual facilitation and tactile cueing tp facilitate L weight shifting. Another PT guarding L knee and keeping L foot from ER.                  PT Education - 10/24/20 1812    Education Details Pt educated on adding additional visit to make up for missed visit.    Person(s) Educated Patient  Methods Explanation    Comprehension Verbalized understanding            PT Short Term Goals - 09/19/20 1430      PT SHORT TERM GOAL #1   Title STG=LTGs             PT Long Term Goals - 10/11/20 1648      PT LONG TERM GOAL #1   Title Pt will increase gait speed from 0.46ms to >0.250m for improved gait safety.    Baseline 0.1411mwith [facility] on 09/19/20    Time 4    Period Weeks    Status On-going    Target Date 11/14/20      PT LONG TERM GOAL #2   Title Pt will be able to demonstrate increased left weight shift to >40% of weight during sit to stand.    Baseline currently ~20% 09/19/20    Time 4    Period Weeks    Status On-going    Target Date 11/14/20      PT LONG TERM GOAL #3   Title Pt will ambulate >200' with LRAD CGA for improved mobility.    Baseline 115' with large based quad cane with sup/CGA 09/19/20    Time 4    Period Weeks    Status On-going    Target Date 11/14/20      PT LONG TERM GOAL #4   Title Pt will decrease TUG to <1 min for improved turning and improved gait speed.    Baseline 1 min, 18 s [facility] 09/[facility]    Time 4    Period Weeks    Status New    Target Date 11/14/20                 Plan - 10/24/20 1813    Clinical Impression Statement Today's skilled PT session focused on continuing to promote increased WBing on LLE,  continued gait [facility] with R handrail and step-to pattern and performing NMR activity stepping forward and back with RLE in order to increase L weight-shifting during gait. Pt continues to demonstrate decreased WB through LLE during transfers and gait, however did well with weight-shifting onto L during stair training and slightly improved with NMR activity towards latter reps. Will continue to address and progress towards LTG.    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 08/20/19 w aspiration 08/21/19, (tx empircally w vanco/ cefepime), LLE DVT 07/21/19 and small left lower lung PE 08/01/19 s/p IVC filter 07/21/19, iv heparin discontinued due to worsening ICH 08/14/19, and abdominal wall hematoma, seizure, pseudomonas uti,    Examination-Activity Limitations Bathing;Sit;Bed Mobility;Bend;Squat;Caring for Others;Stairs;Stand;Carry;Toileting;Dressing;Transfers;Hygiene/Grooming;Locomotion Level    Examination-Participation Restrictions Driving;Other;Meal Prep    Rehab Potential Good    PT Frequency 1x / week    PT Duration 4 weeks    PT Treatment/Interventions ADLs/Self Care Home Management;Aquatic Therapy;Biofeedback;Balance training;Therapeutic exercise;Therapeutic activities;Functional mobility training;Wheelchair mobility training;Orthotic Fit/Training;Stair training;Gait training;DME Instruction;Patient/family education;Cognitive remediation;Neuromuscular re-education;Vestibular;Manual techniques;Passive range of motion;Cryotherapy;Moist Heat    PT Next Visit Plan Stretching for increased L hip IR and extension.  WB through LLE.  Continue with gait training with [facility].  Stair negotiation with R  rail and step to sequence.    Consulted and Agree  with Plan of Care Patient           Patient will benefit from skilled therapeutic intervention in order to improve the following deficits and impairments:  Abnormal gait, Difficulty walking, Decreased mobility, Decreased balance, Decreased cognition, Decreased range of motion, Decreased coordination, Decreased safety awareness, Impaired flexibility, Postural dysfunction, Decreased knowledge of use of DME, Decreased strength, Impaired UE functional use, Pain, Impaired tone, Impaired sensation, Decreased endurance  Visit Diagnosis: Other abnormalities of gait and mobility  Muscle weakness (generalized)  Hemiplegia and hemiparesis following cerebral infarction affecting left non-dominant side [facility])     Problem List Patient Active Problem List   Diagnosis Date Noted  . S/P craniotomy 06/05/2020  . History of cranioplasty 06/05/2020  . Peri-rectal abscess 02/14/2020  . Abnormal CT scan, pelvis 02/14/2020  . Pancytopenia ([place]) 12/02/2019  . Nephrolithiasis 12/02/2019  . Hydronephrosis with renal and ureteral calculus obstruction 12/02/2019  . Rectal pain 12/02/2019  . Hyperkalemia 12/02/2019  . Reactive depression   . Wound infection after surgery   . Sleep disturbance   . Dysphagia, post-stroke   . Transaminitis   . 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 Cerebral abscess   . Urinary tract infection without hematuria   . Altered mental status   . Primary hypercoagulable state ([place])   . Acute pulmonary embolism without acute cor pulmonale (HCC)   . Deep vein thrombosis (DVT) of non-extremity vein   . Hypokalemia   . Acute blood loss anemia   . Leukocytosis   . Endotracheal tube present   . Acute respiratory failure with hypoxemia ([place])   . Stroke (cerebrum) ([place]) 07/19/2019  . Pressure injury of skin 07/19/2019  . Acute CVA (cerebrovascular accident) ([place])   . Encephalopathy   . Dysphagia   . Acute  encephalopathy   . Essential hypertension   . Obesity 03/12/2016  . Scalp abscess 12/20/2015  . Neck abscess 12/20/2015  . Pilonidal cyst 02/08/2013    Rosalita Levan, SPT 10/25/2020, 8:43 AM  [facility] [address] [place], [place], [address] Phone: [phone]   Fax:  [phone]  Name: Matthew Black MRN: [id number] Date of Birth: 1976-01-22

## 2020-10-31 ENCOUNTER — Ambulatory Visit: Payer: Medicaid Other

## 2020-11-01 ENCOUNTER — Ambulatory Visit: Payer: Medicaid Other

## 2020-11-05 ENCOUNTER — Ambulatory Visit: Payer: Medicaid Other | Admitting: Physical Therapy

## 2020-11-05 ENCOUNTER — Other Ambulatory Visit: Payer: Self-pay

## 2020-11-05 DIAGNOSIS — M6281 Muscle weakness (generalized): Secondary | ICD-10-CM

## 2020-11-05 DIAGNOSIS — R2689 Other abnormalities of gait and mobility: Secondary | ICD-10-CM | POA: Diagnosis not present

## 2020-11-05 DIAGNOSIS — I69354 Hemiplegia and hemiparesis following cerebral infarction affecting left non-dominant side: Secondary | ICD-10-CM | POA: Diagnosis not present

## 2020-11-05 DIAGNOSIS — R293 Abnormal posture: Secondary | ICD-10-CM | POA: Diagnosis not present

## 2020-11-05 DIAGNOSIS — R2681 Unsteadiness on feet: Secondary | ICD-10-CM | POA: Diagnosis not present

## 2020-11-06 ENCOUNTER — Encounter: Payer: Self-pay | Admitting: Physical Therapy

## 2020-11-06 NOTE — Therapy (Signed)
[facility] [address] [place], [place], [address] Phone: [phone]   Fax:  [phone]  Physical Therapy Treatment  Patient Details  Name: Matthew Black MRN: [id number] Date of Birth: 1976/02/24 Referring Provider (PT): Dr. Naaman Plummer but f/u with Dr. Ranell Patrick   Encounter Date: 11/05/2020   PT End of Session - 11/06/20 1218    Visit Number 26    Number of Visits 28    Date for PT Re-Evaluation 11/14/20    Authorization Type 4 Visits approved 11/18 - 12/22    Authorization - Visit Number 3   pt's initial MCD auth for 3 visits met on 01/19/20, new auth for 12 visits (02/02/20-03/28/20   Authorization - Number of Visits 4    PT Start Time 1452    PT Stop Time 1532    PT Time Calculation (min) 40 min    Equipment Utilized During Treatment Other (comment)   quad cane   Activity Tolerance Patient tolerated treatment well    Behavior During Therapy [facility] for tasks assessed/performed           Past Medical History:  Diagnosis Date  . 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 Depression   . DVT (deep venous thrombosis) ([place])    BLE DVT 07/21/19, 08/02/19; s/p retrievable IVC filter 07/21/19  . Hemorrhagic stroke ([place])   . History of kidney stones   . Hypertension   . 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 PE (pulmonary thromboembolism) ([place])    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  . Stroke [facility])    RICA, R A1, R MCA occlusion 07/19/19    Past Surgical History:  Procedure Laterality Date  . CRANIOPLASTY Right 06/05/2020   Procedure: CRANIOPLASTY;  Surgeon: Consuella Lose, MD;  Location: [facility];  Service: Neurosurgery;  Laterality: Right;  right  . CRANIOTOMY Right 07/19/2019   Procedure: RIGHT HEMI-CRANIECTOMY With implantation of skull flap to abdominal wall;  Surgeon: Consuella Lose, MD;  Location: [facility];  Service: Neurosurgery;  Laterality: Right;  . CYST EXCISION N/A 10/08/2016   Procedure:  EXCISION OF POSTERIOR NECK CYST;  Surgeon: Clovis Riley, MD;  Location: [facility];  Service: General;  Laterality: N/A;  . CYSTOSCOPY/URETEROSCOPY/HOLMIUM LASER/STENT PLACEMENT Right 03/16/2020   Procedure: CYSTOSCOPY RIGHT RETROGRADE PYELOGRAM URETEROSCOPY/HOLMIUM LASER/STENT PLACEMENT;  Surgeon: Lucas Mallow, MD;  Location: [facility];  Service: Urology;  Laterality: Right;  . INCISION AND DRAINAGE ABSCESS N/A 09/22/2014   Procedure: INCISION AND DRAINAGE ABSCESS POSTERIOR NECK;  Surgeon: Pedro Earls, MD;  Location[facility];  Service: General;  Laterality: N/A;  . INCISION AND DRAINAGE ABSCESS N/A 12/20/2015   Procedure: INCISION AND DRAINAGE POSTERIOR NECK MASS;  Surgeon: Armandina Gemma, MD;  Location: [facility];  Service: General;  Laterality: N/A;  . INCISION AND DRAINAGE ABSCESS Left 07/10/2004   middle finger  . IR IVC FILTER PLMT / S&I /IMG GUID/MOD SED  07/21/2019  . IR RADIOLOGIST EVAL & MGMT  12/14/2019  . IR VENOGRAM RENAL UNI RIGHT  07/21/2019  . MASS EXCISION N/A 07/21/2017   Procedure: EXCISION OF BENIGN NECK LESION WITH LAYERED CLOSURE;  Surgeon: Irene Limbo, MD;  Location: [facility];  Service: Plastics;  Laterality: N/A;  . MASS EXCISION N/A 11/10/2017   Procedure: EXCISION BENIGN LESION OF THE NECK WITH LAYERED CLOSURE;  Surgeon: Irene Limbo, MD;  Location: [facility];  Service: Plastics;  Laterality: N/A;  There were no vitals filed for this visit.   Subjective Assessment - 11/06/20 1203    Subjective Pt reports ongoing shoulder pain; having to use a lot of ice on it.  Painful in the front of the shoulder.    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 Yes    Pain Onset 1 to 4 weeks ago                             [facility] Adult PT Treatment/Exercise - 11/06/20 1204      Bed Mobility   Bed Mobility Rolling Left;Left Sidelying to Sit;Sit to Supine    Rolling Left Moderate Assistance - Patient 50-74%    Left Sidelying to Sit Moderate Assistance - Patient 50-74%    Sit to Supine Minimal Assistance - Patient > 75%      Transfers   Transfers Sit to Stand;Stand to Sit;Stand Pivot Transfers    Sit to Stand 4: Min assist    Sit to Stand Details (indicate cue type and reason) from w/c and mat with assistance for placement of L foot under BOS and for anterior lean in midline for increased WB and use of LLE during sit > stand.  Once standing required assistance to weight shift and WB through LLE due to increased flexor tone    Stand to Sit 4: Min assist    Stand to Sit Details cues to pivot fully and back up to seat prior to sitting    Stand Pivot Transfers 4: Min assist    Stand Pivot Transfer Details (indicate cue type and reason) with quad cane; pt continues to require cues to weight shift and WB through LLE when pivoting to allow for full, safe pivot of R foot when turning to L or R      Ambulation/Gait   Ambulation/Gait Yes    Ambulation/Gait Assistance 4: Min assist    Ambulation/Gait Assistance Details continued to require tactile cues to bring trunk more upright in midline, for weight shift and WB and cues to activate LLE hip and knee extensors in stance to increase stance time, cues for increased step length RLE and cues for anterior rotation of L pelvis and  trunk.    Ambulation Distance (Feet) 115 Feet    Assistive device Large base quad cane    Gait Pattern Step-to pattern;Decreased step length - left;Decreased stance time - left;Decreased hip/knee flexion - left;Decreased weight shift to left;Trunk rotated posteriorly on left;Step-through pattern;Decreased arm swing - left;Decreased stride length;Lateral trunk lean to right;Decreased trunk rotation   LLE externally rotated, LUE flexed   Ambulation Surface Level;Indoor      Therapeutic Activites    Therapeutic Activities Other Therapeutic Activities    Other Therapeutic Activities Discussed with pt lowering leg rest foot plates on w/c due  to shortened position.  Pt sitting with >90 deg hip flexion and ER adding to hip and knee flexor shortening which limits LE extension when performing standing.      Knee/Hip Exercises: Supine   Single Leg Bridge Strengthening;Left;1 set;5 reps      Manual Therapy   Manual Therapy Soft tissue mobilization;Scapular mobilization;Passive ROM    Manual therapy comments In supine with LUE and LLE for ROM and pain management    Soft tissue mobilization Performed STM to L pec major and minor in supine with LUE supported in slight ABD and ER; multiple painful trigger points noted in L pec muscles; pt reports palpation of pec reproduces L shoulder pain    Scapular Mobilization to LUE with therapist supporting LUE; performed scapular mobilization into elevation<>depression, protraction<>retraction first passively and then with pt assisting for AAROM.    Passive ROM PROM to LUE into shoulder ABD, ER and elbow extension slowly and gradually >8 minutes while performing STM and mobilization.  Also performed PROM to L hip into IR with hip extended and then with hip flexed                  PT Education - 11/06/20 1218    Education Details see TA    Person(s) Educated Patient    Methods Explanation    Comprehension Verbalized understanding            PT Short  Term Goals - 09/19/20 1430      PT SHORT TERM GOAL #1   Title STG=LTGs             PT Long Term Goals - 10/11/20 1648      PT LONG TERM GOAL #1   Title Pt will increase gait speed from 0.7ms to >0.234m for improved gait safety.    Baseline 0.1428mwith [facility] on 09/19/20    Time 4    Period Weeks    Status On-going    Target Date 11/14/20      PT LONG TERM GOAL #2   Title Pt will be able to demonstrate increased left weight shift to >40% of weight during sit to stand.    Baseline currently ~20% 09/19/20    Time 4    Period Weeks    Status On-going    Target Date 11/14/20      PT LONG TERM GOAL #3   Title Pt will ambulate >200' with LRAD CGA for improved mobility.    Baseline 115' with large based quad cane with sup/CGA 09/19/20    Time 4    Period Weeks    Status On-going    Target Date 11/14/20      PT LONG TERM GOAL #4   Title Pt will decrease TUG to <1 min for improved turning and improved gait speed.    Baseline 1 min, 18 s [facility] 09/19/20    Time 4    Period Weeks    Status New    Target Date 11/14/20                 Plan - 11/06/20 1219    Clinical Impression Statement Addressed patient's ongoing shoulder pain and ROM in LLE to improve pt's ability to WB through LLE during sit > stand, transfers and ambulation.  Will lower foot plates on w/c next session to decrease bilat hip flexion and ER angle.    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 08/20/19 w aspiration 08/21/19, (tx empircally w vanco/ cefepime), LLE DVT 07/21/19 and small left lower lung PE 08/01/19 s/p IVC filter 07/21/19, iv heparin discontinued due to worsening ICH 08/14/19, and abdominal  wall hematoma, seizure, pseudomonas uti,    Examination-Activity Limitations Bathing;Sit;Bed Mobility;Bend;Squat;Caring for Others;Stairs;Stand;Carry;Toileting;Dressing;Transfers;Hygiene/Grooming;Locomotion Level    Examination-Participation Restrictions Driving;Other;Meal Prep    Rehab Potential Good    PT Frequency 1x / week    PT Duration 4 weeks    PT Treatment/Interventions ADLs/Self Care Home Management;Aquatic Therapy;Biofeedback;Balance training;Therapeutic exercise;Therapeutic activities;Functional mobility training;Wheelchair mobility training;Orthotic Fit/Training;Stair training;Gait training;DME Instruction;Patient/family education;Cognitive remediation;Neuromuscular re-education;Vestibular;Manual techniques;Passive range of motion;Cryotherapy;Moist Heat    PT Next Visit Plan **I would like to lower his leg rests, he is in a significant amount of hip flexion and ER adding to his tightness.**  Last visit, renew for more visits in the new year??  Stretching for increased L hip IR and extension.  WB through LLE.  Continue with gait training with [facility].  Stair negotiation with R rail and step to sequence.    Consulted and Agree with Plan of Care Patient           Patient will benefit from skilled therapeutic intervention in order to improve the following deficits and impairments:  Abnormal gait,Difficulty walking,Decreased mobility,Decreased balance,Decreased cognition,Decreased range of motion,Decreased coordination,Decreased safety awareness,Impaired flexibility,Postural dysfunction,Decreased knowledge of use of DME,Decreased strength,Impaired UE functional use,Pain,Impaired tone,Impaired sensation,Decreased endurance  Visit Diagnosis: Other abnormalities of gait and mobility  Muscle weakness (generalized)  Hemiplegia and hemiparesis following cerebral infarction affecting left non-dominant side (HCC)  Unsteadiness on feet     Problem List Patient Active Problem List    Diagnosis Date Noted  . S/P craniotomy 06/05/2020  . History of cranioplasty 06/05/2020  . Peri-rectal abscess 02/14/2020  . Abnormal CT scan, pelvis 02/14/2020  . Pancytopenia ([place]) 12/02/2019  . Nephrolithiasis 12/02/2019  . Hydronephrosis with renal and ureteral calculus obstruction 12/02/2019  . Rectal pain 12/02/2019  . Hyperkalemia 12/02/2019  . Reactive depression   . Wound infection after surgery   . Sleep disturbance   . Dysphagia, post-stroke   . Transaminitis   . 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 Cerebral abscess   . Urinary tract infection without hematuria   . Altered mental status   . Primary hypercoagulable state ([place])   . Acute pulmonary embolism without acute cor pulmonale (HCC)   . Deep vein thrombosis (DVT) of non-extremity vein   . Hypokalemia   . Acute blood loss anemia   . Leukocytosis   . Endotracheal tube present   . Acute respiratory failure with hypoxemia ([place])   . Stroke (cerebrum) ([place]) 07/19/2019  . Pressure injury of skin 07/19/2019  . Acute CVA (cerebrovascular accident) ([place])   . Encephalopathy   . Dysphagia   . Acute encephalopathy   . Essential hypertension   . Obesity 03/12/2016  . Scalp abscess 12/20/2015  . Neck abscess 12/20/2015  . Pilonidal cyst 02/08/2013    Rico Junker, PT, DPT 11/06/20    12:22 PM    [facility] [address] [place], [place], [address] Phone: [phone]   Fax:  [phone]  Name: Matthew Black MRN: [id number] Date of Birth: 07/11/1976

## 2020-11-08 ENCOUNTER — Encounter: Payer: Medicaid Other | Admitting: Physical Medicine and Rehabilitation

## 2020-11-14 ENCOUNTER — Other Ambulatory Visit: Payer: Self-pay

## 2020-11-14 ENCOUNTER — Ambulatory Visit: Payer: Medicaid Other

## 2020-11-14 DIAGNOSIS — R293 Abnormal posture: Secondary | ICD-10-CM | POA: Diagnosis not present

## 2020-11-14 DIAGNOSIS — R2681 Unsteadiness on feet: Secondary | ICD-10-CM | POA: Diagnosis not present

## 2020-11-14 DIAGNOSIS — M6281 Muscle weakness (generalized): Secondary | ICD-10-CM

## 2020-11-14 DIAGNOSIS — I69354 Hemiplegia and hemiparesis following cerebral infarction affecting left non-dominant side: Secondary | ICD-10-CM

## 2020-11-14 DIAGNOSIS — R2689 Other abnormalities of gait and mobility: Secondary | ICD-10-CM | POA: Diagnosis not present

## 2020-11-15 NOTE — Therapy (Signed)
[facility] [address] [place], [place], [address] Phone: [phone]   Fax:  [phone]  Physical Therapy Treatment/Recert  Patient Details  Name: Matthew Black MRN: [id number] Date of Birth: [birth year] Referring Provider (PT): Dr. Naaman Plummer but f/u with Dr. Ranell Patrick   Encounter Date: 11/14/2020   PT End of Session - 11/14/20 1406    Visit Number 27    Number of Visits 33    Date for PT Re-Evaluation --    Authorization Type 4 Visits approved 11/18 - 12/22    Authorization - Visit Number 4   pt's initial MCD auth for 3 visits met on 01/19/20, new auth for 12 visits (02/02/20-03/28/20   Authorization - Number of Visits 4    PT Start Time 1404    PT Stop Time 1443    PT Time Calculation (min) 39 min    Equipment Utilized During Treatment Other (comment);Gait belt   quad cane   Activity Tolerance Patient tolerated treatment well    Behavior During Therapy [facility] for tasks assessed/performed           Past Medical History:  Diagnosis Date  . 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 Depression   . DVT (deep venous thrombosis) ([place])    BLE DVT 07/21/19, 08/02/19; s/p retrievable IVC filter 07/21/19  . Hemorrhagic stroke ([place])   . History of kidney stones   . Hypertension   . 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 PE (pulmonary thromboembolism) ([place])    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  . Stroke [facility])    RICA, R A1, R MCA occlusion 07/19/19    Past Surgical History:  Procedure Laterality Date  . CRANIOPLASTY Right 06/05/2020   Procedure: CRANIOPLASTY;  Surgeon: Consuella Lose, MD;  Location: [place];  Service: Neurosurgery;  Laterality: Right;  right  . CRANIOTOMY Right 07/19/2019   Procedure: RIGHT HEMI-CRANIECTOMY With implantation of skull flap to abdominal wall;  Surgeon: Consuella Lose, MD;  Location: [place];  Service: Neurosurgery;  Laterality: Right;  . CYST EXCISION N/A 10/08/2016    Procedure: EXCISION OF POSTERIOR NECK CYST;  Surgeon: Clovis Riley, MD;  [facility];  Service: General;  Laterality: N/A;  . CYSTOSCOPY/URETEROSCOPY/HOLMIUM LASER/STENT PLACEMENT Right 03/16/2020   Procedure: CYSTOSCOPY RIGHT RETROGRADE PYELOGRAM URETEROSCOPY/HOLMIUM LASER/STENT PLACEMENT;  Surgeon: Lucas Mallow, MD;  [facility];  Service: Urology;  Laterality: Right;  . INCISION AND DRAINAGE ABSCESS N/A 09/22/2014   Procedure: INCISION AND DRAINAGE ABSCESS POSTERIOR NECK;  Surgeon: Pedro Earls, MD;  [facility];  Service: General;  Laterality: N/A;  . INCISION AND DRAINAGE ABSCESS N/A 12/20/2015   Procedure: INCISION AND DRAINAGE POSTERIOR NECK MASS;  Surgeon: Armandina Gemma, MD;  [facility];  Service: General;  Laterality: N/A;  . INCISION AND DRAINAGE ABSCESS Left 07/10/2004   middle finger  . IR IVC FILTER PLMT / S&I /IMG GUID/MOD SED  07/21/2019  . IR RADIOLOGIST EVAL & MGMT  12/14/2019  . IR VENOGRAM RENAL UNI RIGHT  07/21/2019  . MASS EXCISION N/A 07/21/2017   Procedure: EXCISION OF BENIGN NECK LESION WITH LAYERED CLOSURE;  Surgeon: Irene Limbo, MD;  Location: [place];  Service: Plastics;  Laterality: N/A;  . MASS EXCISION N/A 11/10/2017   Procedure: EXCISION BENIGN LESION OF THE NECK WITH LAYERED CLOSURE;  Surgeon: Irene Limbo, MD;  Location: [place];  Service: Plastics;  Laterality: N/A;  There were no vitals filed for this visit.   Subjective Assessment - 11/14/20 1406    Subjective Pt reports that his shoulder is some better but still hurting more than it used to. He is icing it. Pt reports that he continues to walk with wife at home with the quad cane. Pt's wife going to try to take the w/c they got back to the store to see if they can adjust the foot plates or get new ones. Pt reports that he wishes he could get down the steps at his house as they have to wait on father-in-law to get down ramp in w/c. 3  Steps at house have right rail.    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 Yes    Pain Score 7     Pain Location Shoulder    Pain Orientation Left    Pain Descriptors / Indicators Throbbing    Pain Type Acute pain    Pain Onset 1 to 4 weeks ago    Pain Frequency Intermittent              [facility] PT Assessment - 11/14/20 1427      Assessment   Medical Diagnosis R MCA/ACA CVA    Referring Provider (PT) Dr. Naaman Plummer but f/u with Dr. Ranell Patrick    Onset Date/Surgical Date 07/19/19                         [facility] Adult PT Treatment/Exercise - 11/14/20 1427      Transfers   Transfers Sit to Stand;Stand to Sit    Sit to Stand 4: Min guard;4: Min assist    Sit to Stand Details (indicate cue type and reason) from w/c. PT gave verbal cues to bring left leg back prior to rising and had to assist at times. Tactile cues to try to weight shift to left some with rising.    Stand to Sit 4: Min guard      Ambulation/Gait   Ambulation/Gait Yes    Ambulation/Gait Assistance 4: Min guard;4: Min assist    Ambulation/Gait Assistance Details PT provided cues to try to get more anterior pelvic rotation on left and tactile cues fo rmore left quad activation. Pt's LLE remains flexed and ER throughout gait. LUE flexed.    Ambulation Distance  (Feet) 81 Feet   60' x 1   Assistive device Large base quad cane   left AFO   Gait Pattern Step-to pattern;Decreased step length - right;Decreased step length - left;Decreased stance time - left;Trunk rotated posteriorly on left    Ambulation Surface Level;Indoor    Gait velocity 1 min 29 sec=0.27ms    Stairs Yes    Stairs Assistance 3: Mod assist;2: Max assist   2nd person for safety   Stairs Assistance Details (indicate cue type and  reason) Verbal cues to try to weight shift more to left and tighten quad as well as for sequence. PT provided assist to block left knee with weight shift.    Stair Management Technique One rail Right;Step to pattern    Number of Stairs 4    Height of Stairs 6      Standardized Balance Assessment   Standardized Balance Assessment Timed Up and Go Test      Timed Up and Go Test   TUG Normal TUG    Normal TUG (seconds) 65      Therapeutic Activites    Therapeutic Activities Other Therapeutic Activities    Other Therapeutic Activities PT looked at w/c foot plates to see if could be lowered to provide better positioning as in extremely flexed and ER position. Everything was fixed on the current foot plate. Pt reports wife just got this chair and she is trying to reach out to place she purchased to see if change can be made to foot rests. PT recommended adjustable foot rests to all for improved leg position.                  PT Education - 11/15/20 [id number]    Education Details PT discussed recert plan as pt would like to continue to work more on stairs and gait.    Person(s) Educated Patient    Methods Explanation    Comprehension Verbalized understanding            PT Short Term Goals - 09/19/20 1430      PT SHORT TERM GOAL #1   Title STG=LTGs             PT Long Term Goals - 11/15/20 0939      PT LONG TERM GOAL #1   Title Pt will increase gait speed from 0.38ms to >0.233m for improved gait safety.    Baseline 11/14/20 0.1113m   Time  4    Period Weeks    Status Not Met      PT LONG TERM GOAL #2   Title Pt will be able to demonstrate increased left weight shift to >40% of weight during sit to stand.    Baseline 11/14/20 Pt able to get about 30% of weight through LLE when PT helps to facilitate weight shift.    Time 4    Period Weeks    Status Not Met      PT LONG TERM GOAL #3   Title Pt will ambulate >200' with LRAD CGA for improved mobility.    Baseline 11/14/20 115' with large based quad cane CGA and then min assist at times to try to facilitate more left weight shift.    Time 4    Period Weeks    Status On-going      PT LONG TERM GOAL #4   Title Pt will decrease TUG to <1 min for improved turning and improved gait speed.    Baseline 1 min, 18 s best trial with [facility] on 09/19/20. 11/14/20 TUG=65 sec    Time 4    Period Weeks    Status Partially Met          Updated LTGs:  PT Long Term Goals - 11/15/20 [id number]   PT LONG TERM GOAL #1   Title Pt will increase gait speed from 0.33m79mo >0.77m/33mr improved gait safety.    Baseline 11/14/20 0.33m/s23mTime 6    Period Weeks  Status Revised    Target Date 01/17/21      PT LONG TERM GOAL #2   Title Pt will be able to negotiate 4 steps with right rail min assist to be able to perform with family at home for improved access to home.    Baseline max assist with right rail on 11/14/20    Time 6    Period Weeks    Status New    Target Date 01/17/21      PT LONG TERM GOAL #3   Title Pt will ambulate >200' with LRAD CGA for improved mobility.    Baseline 11/14/20 115' with large based quad cane CGA and then min assist at times to try to facilitate more left weight shift.    Time 6    Period Weeks    Status On-going    Target Date 01/17/21      PT LONG TERM GOAL #4   Title Pt will decrease TUG to <1 min for improved turning and improved gait speed.    Baseline 1 min, 18 s best trial with [facility] on 09/19/20. 11/14/20 TUG=65 sec    Time 6    Period  Weeks    Status On-going    Target Date 01/17/21                 Plan - 11/15/20 0942    Clinical Impression Statement PT assessed LTGs at visit today. Pt making gradual progress. He is requiring less assistance overall with mobility tasks unless PT is trying to work on increasing left weight shift and quality of movement. Pt is limited by weakness and flexor tone in LLE. Pt's gait distance varies day to day with longest distance around 25' with large based quad cane. RLE fatigues with longer distances. Pt's gait speed slow at 0.29ms indicating decreased safety with household mobility. Pt has been walking more in home with family now. Pt decreased his time on the TUG to 65 sec showing some improvements in functional strength and balance. Pt was assessed on steps as this is big goal for him so that he would be able to access home with wife versus needing father-in-law to come to get w/c up/down ramp. Pt is max assist on steps currently. Pt will benefit from continued PT to continue to work more on functional mobility tasks with walking and stairs, strengthening/flexibility of LLE and balance.    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 08/20/19 w aspiration 08/21/19, (tx empircally w vanco/ cefepime), LLE DVT 07/21/19 and small left lower lung PE 08/01/19 s/p IVC filter 07/21/19, iv heparin discontinued due to worsening ICH 08/14/19, and abdominal wall hematoma, seizure, pseudomonas uti,    Examination-Activity Limitations Bathing;Sit;Bed Mobility;Bend;Squat;Caring for Others;Stairs;Stand;Carry;Toileting;Dressing;Transfers;Hygiene/Grooming;Locomotion Level    Examination-Participation Restrictions  Driving;Other;Meal Prep    Rehab Potential Good    PT Frequency 1x / week    PT Duration 6 weeks    PT Treatment/Interventions ADLs/Self Care Home Management;Aquatic Therapy;Biofeedback;Balance training;Therapeutic exercise;Therapeutic activities;Functional mobility training;Wheelchair mobility training;Orthotic Fit/Training;Stair training;Gait training;DME Instruction;Patient/family education;Cognitive remediation;Neuromuscular re-education;Vestibular;Manual techniques;Passive range of motion;Cryotherapy;Moist Heat    PT Next Visit Plan Were they able to get different foot plates for w/c?  Stretching for increased L hip IR and extension.  WB through LLE.  Continue with  gait training with [facility].  Stair negotiation with R rail and step to sequence as this is big goal for pt. Do we want to get OT referral in new year?    Recommended Other Services I spoke with OT, Vania Rea, about possible OT referral and she agreed that would be beneficial.    Consulted and Agree with Plan of Care Patient           Patient will benefit from skilled therapeutic intervention in order to improve the following deficits and impairments:  Abnormal gait,Difficulty walking,Decreased mobility,Decreased balance,Decreased cognition,Decreased range of motion,Decreased coordination,Decreased safety awareness,Impaired flexibility,Postural dysfunction,Decreased knowledge of use of DME,Decreased strength,Impaired UE functional use,Pain,Impaired tone,Impaired sensation,Decreased endurance  Visit Diagnosis: Other abnormalities of gait and mobility  Muscle weakness (generalized)  Hemiplegia and hemiparesis following cerebral infarction affecting left non-dominant side (HCC)  Abnormal posture  Unsteadiness on feet     Problem List Patient Active Problem List   Diagnosis Date Noted  . S/P craniotomy 06/05/2020  . History of cranioplasty 06/05/2020  . Peri-rectal abscess 02/14/2020  . Abnormal CT scan, pelvis 02/14/2020  .  Pancytopenia ([place]) 12/02/2019  . Nephrolithiasis 12/02/2019  . Hydronephrosis with renal and ureteral calculus obstruction 12/02/2019  . Rectal pain 12/02/2019  . Hyperkalemia 12/02/2019  . Reactive depression   . Wound infection after surgery   . Sleep disturbance   . Dysphagia, post-stroke   . Transaminitis   . 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 Cerebral abscess   . Urinary tract infection without hematuria   . Altered mental status   . Primary hypercoagulable state ([place])   . Acute pulmonary embolism without acute cor pulmonale (HCC)   . Deep vein thrombosis (DVT) of non-extremity vein   . Hypokalemia   . Acute blood loss anemia   . Leukocytosis   . Endotracheal tube present   . Acute respiratory failure with hypoxemia ([place])   . Stroke (cerebrum) ([place]) 07/19/2019  . Pressure injury of skin 07/19/2019  . Acute CVA (cerebrovascular accident) ([place])   . Encephalopathy   . Dysphagia   . Acute encephalopathy   . Essential hypertension   . Obesity 03/12/2016  . Scalp abscess 12/20/2015  . Neck abscess 12/20/2015  . Pilonidal cyst 02/08/2013    Electa Sniff, PT, DPT, NCS 11/15/2020, 9:52 AM  [facility] [address] [place], [place], [address] Phone: [phone]   Fax:  [phone]  Name: HO PARISI MRN: [id number] Date of Birth: 1976/08/08  Managed medicaid CPT codes: 25638- Therapeutic Exercise, [phone]- Neuro Re-education, [phone] - Gait Training, [phone] - Manual Therapy, [id number] - Therapeutic Activities and [phone] - Self Care

## 2020-11-20 ENCOUNTER — Encounter: Payer: Self-pay | Admitting: Medical

## 2020-11-20 ENCOUNTER — Other Ambulatory Visit: Payer: Self-pay

## 2020-11-20 ENCOUNTER — Ambulatory Visit: Payer: Medicaid Other | Admitting: Medical

## 2020-11-20 VITALS — BP 119/80 | HR 78 | Resp 18 | Ht 72.0 in | Wt 200.0 lb

## 2020-11-20 DIAGNOSIS — I10 Essential (primary) hypertension: Secondary | ICD-10-CM | POA: Diagnosis not present

## 2020-11-20 DIAGNOSIS — F329 Major depressive disorder, single episode, unspecified: Secondary | ICD-10-CM | POA: Diagnosis not present

## 2020-11-20 DIAGNOSIS — Z8673 Personal history of transient ischemic attack (TIA), and cerebral infarction without residual deficits: Secondary | ICD-10-CM | POA: Diagnosis not present

## 2020-11-20 DIAGNOSIS — R252 Cramp and spasm: Secondary | ICD-10-CM | POA: Diagnosis not present

## 2020-11-20 DIAGNOSIS — Z9889 Other specified postprocedural states: Secondary | ICD-10-CM | POA: Diagnosis not present

## 2020-11-20 MED ORDER — BACLOFEN 10 MG PO TABS: 10.0000 mg | ORAL_TABLET | Freq: Two times a day (BID) | ORAL | 1 refills | Status: DC

## 2020-11-20 MED ORDER — VENLAFAXINE HCL ER 37.5 MG PO CP24
37.5000 mg | ORAL_CAPSULE | Freq: Every day | ORAL | 0 refills | Status: DC
Start: 2020-11-20 — End: 2020-12-17

## 2020-11-20 NOTE — Progress Notes (Signed)
Subjective:    Patient ID: Matthew Black, male    DOB: 31-Oct-1976, 44 y.o.   MRN: [id number]  HPI  Pt in for first time. No recent visits with our office that  I can see.  Pt had a stroke in August 2021  A/P from ED when admitted.  43yM with AMS.  L hemiparesis and aphasia. Initial history that last known normal well beyond 24 hours ago. Subsequently able to obtain additional information from wife an hour after he presented to the ER. She says last known normal around 1200 yesterday when she spoke to him. That would make him barely within 24 hour window for possible LVO intervention. Briefly discussed with neurology with regards to this. Currently we do not have the available resources at this facility for intervention as there is a patient on the table with neuro interventionalist. We would not be able to obtain necessary imaging and transfer to next closest facility within an hour.   Unfortunately, imaging as above. Neurology to see. Medicine admission. Wife at bedside and updated.  Pt dc summary  Admit date: 09/06/2019 Discharge date: 10/07/2019  Discharge Diagnoses:  Principal Problem:   Right middle cerebral artery stroke [facility]) Active Problems:   Dysphagia, post-stroke   Transaminitis   Sleep disturbance   Wound infection after surgery   Low grade fever   Reactive depression Right peroneal, left popliteal, left posterior tibial and left peroneal DVT.  Status post IVC filter 07/21/2019 Pain management Cerebral abscess/empyema/cerebritis Pseudomonas UTI Hypertension History of tobacco and marijuana use  Discharged Condition: Stable  Pt states still some depression. Pt is on celexa 20 mg daily.  He is not sure. He states causes loose stools. When stops taking he will get diarrhea. When restarts diarrhea reoccurs. Stopped celexa a week ago and diarrhea resolved.  On review pt not on aspirin.  Since dc from hospital pt states he is doing well.  Pt was on baclofen  post hospitalization. He ran Saturday and has spasm of left leg since then.     Review of Systems  Constitutional: Negative for chills, fatigue and fever.  Respiratory: Negative for cough, chest tightness and shortness of breath.   Cardiovascular: Negative for chest pain and palpitations.  Gastrointestinal: Negative for abdominal pain.  Musculoskeletal: Negative for back pain.  Neurological: Negative for dizziness, seizures, weakness, light-headedness and headaches.  Hematological: Negative for adenopathy. Does not bruise/bleed easily.  Psychiatric/Behavioral: Negative for behavioral problems and decreased concentration.    Past Medical History:  Diagnosis Date  . 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 Depression   . DVT (deep venous thrombosis) ([place])    BLE DVT 07/21/19, 08/02/19; s/p retrievable IVC filter 07/21/19  . Hemorrhagic stroke ([place])   . History of kidney stones   . Hypertension   . 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 PE (pulmonary thromboembolism) ([place])    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  . Stroke [facility])    RICA, R A1, R MCA occlusion 07/19/19     Social History   Socioeconomic History  . Marital status: Married    Spouse name: Not on file  . Number of children: 2  . Years of education: Not on file  . Highest education level: Not on file  Occupational History  . Occupation: Clinical biochemist   Tobacco Use  . Smoking status: Former Smoker    Packs/day: 0.00    Quit date: 11/24/2015    Years since quitting:  4.9  . Smokeless tobacco: Never Used  . Tobacco comment:    Vaping Use  . Vaping Use: Never used  Substance and Sexual Activity  . Alcohol use: Not Currently  . Drug use: Never  . Sexual activity: Not Currently  Other Topics Concern  . Not on file  Social History Narrative   Household-- pt, wife, 2 children   Social Determinants of Health   Financial Resource Strain: Not on file  Food Insecurity: Not on file   Transportation Needs: Not on file  Physical Activity: Not on file  Stress: Not on file  Social Connections: Not on file  Intimate Partner Violence: Not on file    Past Surgical History:  Procedure Laterality Date  . CRANIOPLASTY Right 06/05/2020   Procedure: CRANIOPLASTY;  Surgeon: Consuella Lose, MD;  Location: [place];  Service: Neurosurgery;  Laterality: Right;  right  . CRANIOTOMY Right 07/19/2019   Procedure: RIGHT HEMI-CRANIECTOMY With implantation of skull flap to abdominal wall;  Surgeon: Consuella Lose, MD;  Location: [place];  Service: Neurosurgery;  Laterality: Right;  . CYST EXCISION N/A 10/08/2016   Procedure: EXCISION OF POSTERIOR NECK CYST;  Surgeon: Clovis Riley, MD;  [facility];  Service: General;  Laterality: N/A;  . CYSTOSCOPY/URETEROSCOPY/HOLMIUM LASER/STENT PLACEMENT Right 03/16/2020   Procedure: CYSTOSCOPY RIGHT RETROGRADE PYELOGRAM URETEROSCOPY/HOLMIUM LASER/STENT PLACEMENT;  Surgeon: Lucas Mallow, MD;  [facility];  Service: Urology;  Laterality: Right;  . INCISION AND DRAINAGE ABSCESS N/A 09/22/2014   Procedure: INCISION AND DRAINAGE ABSCESS POSTERIOR NECK;  Surgeon: Pedro Earls, MD;  [facility];  Service: General;  Laterality: N/A;  . INCISION AND DRAINAGE ABSCESS N/A 12/20/2015   Procedure: INCISION AND DRAINAGE POSTERIOR NECK MASS;  Surgeon: Armandina Gemma, MD;  [facility];  Service: General;  Laterality: N/A;  . INCISION AND DRAINAGE ABSCESS Left 07/10/2004   middle finger  . IR IVC FILTER PLMT / S&I /IMG GUID/MOD SED  07/21/2019  . IR RADIOLOGIST EVAL & MGMT  12/14/2019  . IR VENOGRAM RENAL UNI RIGHT  07/21/2019  . MASS EXCISION N/A 07/21/2017   Procedure: EXCISION OF BENIGN NECK LESION WITH LAYERED CLOSURE;  Surgeon: Irene Limbo, MD;  Location: [place];  Service: Plastics;  Laterality: N/A;  . MASS EXCISION N/A 11/10/2017   Procedure: EXCISION BENIGN LESION OF THE NECK WITH LAYERED CLOSURE;  Surgeon:  Irene Limbo, MD;  Location: [place];  Service: Plastics;  Laterality: N/A;    Family History  Problem Relation Age of Onset  . Healthy Mother   . Healthy Father   . Clotting disorder Maternal Grandmother   . Diabetes Paternal Grandfather   . Clotting disorder Maternal Aunt   . Clotting disorder Maternal Uncle   . Colon cancer Neg Hx     Allergies  Allergen Reactions  . Hydrocodone Nausea Only    Current Outpatient Medications on File Prior to Visit  Medication Sig Dispense Refill  . acetaminophen (TYLENOL) 325 MG tablet Take 650 mg by mouth every 6 (six) hours as needed for moderate pain.     . baclofen (LIORESAL) 10 MG tablet TAKE 1 TABLET BY MOUTH TWICE A DAY 180 tablet 1  . metoprolol tartrate (LOPRESSOR) 50 MG tablet Take 1 tablet (50 mg total) by mouth 2 (two) times daily. 270 tablet 0   No current facility-administered medications on file prior to visit.    BP 119/80   Pulse 78   Resp 18  Ht 6' (1.829 m)   Wt 200 lb (90.7 kg)   SpO2 98%   BMI 27.12 kg/m       Objective:   Physical Exam   General Mental Status- Alert. General Appearance- Not in acute distress.   Cranium- post surgical changes rt side.  Skin General: Color- Normal Color. Moisture- Normal Moisture.  Neck No jvd.  Chest and Lung Exam Auscultation: Breath Sounds:-Normal.  Cardiovascular Auscultation:Rythm- Regular. Murmurs & Other Heart Sounds:Auscultation of the heart reveals- No Murmurs.  Abdomen Inspection:-Inspeection Normal. Palpation/Percussion:Note:No mass. Palpation and Percussion of the abdomen reveal- Non Tender, Non Distended + BS, no rebound or guarding.    Neurologic Cranial Nerve exam:- CN III-XII intact(No nystagmus), symmetric smile. Left upper ext- laying by side. Can't move. Left lower ext- can't move as well.     Assessment & Plan:  For history of hypertension continue with metoprolol.  For history of stroke I do want you to get  established with neurologist.  I did refer to neurologist who appears to have seen you while you are hospitalized.  We will also go ahead and check lipid panel today to assess cholesterol levels.  History of depression.  Will prescribe Effexor 37.5 mg.  Stay off of Celexa which you have not been on in a week.  It does appear that Celexa caused you to have diarrhea.  Hopefully you will be able to tolerate Effexor with no side effects.  For muscle cramps will get metabolic panel and magnesium level today.  For history of muscle spasticity we will refill your baclofen.  Follow-up in 3 weeks or as needed.  Esperanza Richters, PA-C

## 2020-11-20 NOTE — Patient Instructions (Signed)
For history of hypertension continue with metoprolol.  For history of stroke I do want you to get established with neurologist.  I did refer to neurologist who appears to have seen you while you are hospitalized.  We will also go ahead and check lipid panel today to assess cholesterol levels.  History of depression.  Will prescribe Effexor 37.5 mg.  Stay off of Celexa which you have not been on in a week.  It does appear that Celexa caused you to have diarrhea.  Hopefully you will be able to tolerate Effexor with no side effects.  For muscle cramps will get metabolic panel and magnesium level today.  For history of muscle spasticity we will refill your baclofen.  Follow-up in 3 weeks or as needed.

## 2020-11-21 ENCOUNTER — Telehealth: Payer: Self-pay | Admitting: Medical

## 2020-11-21 LAB — COMPREHENSIVE METABOLIC PANEL
ALT: 20 U/L (ref 0–53)
AST: 16 U/L (ref 0–37)
Albumin: 4.5 g/dL (ref 3.5–5.2)
Alkaline Phosphatase: 126 U/L — ABNORMAL HIGH (ref 39–117)
BUN: 12 mg/dL (ref 6–23)
CO2: 29 mEq/L (ref 19–32)
Calcium: 9.4 mg/dL (ref 8.4–10.5)
Chloride: 106 mEq/L (ref 96–112)
Creatinine, Ser: 1.26 mg/dL (ref 0.40–1.50)
GFR: 70.11 mL/min (ref >60.00)
Glucose, Bld: 63 mg/dL — ABNORMAL LOW (ref 70–99)
Potassium: 3.9 mEq/L (ref 3.5–5.1)
Sodium: 143 mEq/L (ref 135–145)
Total Bilirubin: 0.4 mg/dL (ref 0.2–1.2)
Total Protein: 7.7 g/dL (ref 6.0–8.3)

## 2020-11-21 LAB — LIPID PANEL
Cholesterol: 181 mg/dL (ref 0–200)
HDL: 37.5 mg/dL — ABNORMAL LOW (ref >39.00)
LDL Cholesterol: 117 mg/dL — ABNORMAL HIGH (ref 0–99)
NonHDL: 143.3
Total CHOL/HDL Ratio: 5
Triglycerides: 134 mg/dL (ref 0.0–149.0)
VLDL: 26.8 mg/dL (ref 0.0–40.0)

## 2020-11-21 LAB — MAGNESIUM: Magnesium: 2 mg/dL (ref 1.5–2.5)

## 2020-11-21 MED ORDER — ATORVASTATIN CALCIUM 10 MG PO TABS: 10.0000 mg | ORAL_TABLET | Freq: Every day | ORAL | 3 refills | Status: DC

## 2020-11-21 NOTE — Telephone Encounter (Signed)
Prescription of atorvastatin sent to patient's pharmacy. 

## 2020-11-28 ENCOUNTER — Other Ambulatory Visit: Payer: Self-pay | Admitting: Diagnostic Radiology

## 2020-12-01 ENCOUNTER — Ambulatory Visit: Payer: Medicaid Other

## 2020-12-05 ENCOUNTER — Telehealth: Payer: Self-pay

## 2020-12-05 ENCOUNTER — Ambulatory Visit: Payer: Medicaid Other | Attending: Physician Assistant

## 2020-12-05 ENCOUNTER — Other Ambulatory Visit: Payer: Self-pay

## 2020-12-05 DIAGNOSIS — M25612 Stiffness of left shoulder, not elsewhere classified: Secondary | ICD-10-CM | POA: Insufficient documentation

## 2020-12-05 DIAGNOSIS — R2689 Other abnormalities of gait and mobility: Secondary | ICD-10-CM | POA: Insufficient documentation

## 2020-12-05 DIAGNOSIS — R208 Other disturbances of skin sensation: Secondary | ICD-10-CM | POA: Insufficient documentation

## 2020-12-05 DIAGNOSIS — M6281 Muscle weakness (generalized): Secondary | ICD-10-CM | POA: Diagnosis not present

## 2020-12-05 DIAGNOSIS — M25512 Pain in left shoulder: Secondary | ICD-10-CM | POA: Insufficient documentation

## 2020-12-05 DIAGNOSIS — I69354 Hemiplegia and hemiparesis following cerebral infarction affecting left non-dominant side: Secondary | ICD-10-CM | POA: Insufficient documentation

## 2020-12-05 DIAGNOSIS — R41840 Attention and concentration deficit: Secondary | ICD-10-CM | POA: Diagnosis not present

## 2020-12-05 DIAGNOSIS — G8929 Other chronic pain: Secondary | ICD-10-CM | POA: Insufficient documentation

## 2020-12-05 NOTE — Telephone Encounter (Signed)
Dr. Ranell Patrick, Matthew Black is being seen by PT.  The patient would benefit from OT evaluation to address left UE hemiparesis and shoulder pain.   If you agree, please place an order in [facility] workque in [facility] or fax the order to [phone]. Thank you, Cherly Anderson, PT, DPT, [place] [address] [place], [place]  [address] Phone:  [phone] Fax:  [phone]

## 2020-12-05 NOTE — Patient Instructions (Signed)
Access Code: [email or web address] URL: [email or web address] Date: 12/05/2020 Prepared by: Cherly Anderson  Exercises Supine Bridge - 1 x daily - 6 x weekly - 5 reps - 3 sets - 2 hold Bent Knee Fallouts - 1 x daily - 6 x weekly - 5 reps - 3 sets Supine Heel Slide with Strap - 1 x daily - 6 x weekly - 5 reps - 2 sets Supine Knee Extension Strengthening - 1 x daily - 6 x weekly - 5 reps - 3 sets Supine Bilateral Hip Internal Rotation Stretch - 3 x daily - 7 x weekly - 1 sets - 3 reps - 30 sec hold Supine Hip Flexor Stretch with Caregiver - 3 x daily - 7 x weekly - 1 sets - 3 reps - 30 sec hold

## 2020-12-05 NOTE — Therapy (Signed)
[facility] [address] [place], [place], [address] Phone: [phone]   Fax:  [phone]  Physical Therapy Treatment  Patient Details  Name: Matthew Black MRN: [id number] Date of Birth: 1976-01-24 Referring Provider (PT): Dr. Naaman Plummer but f/u with Dr. Ranell Patrick   Encounter Date: 12/05/2020   PT End of Session - 12/05/20 1537    Visit Number 28    Number of Visits 33    Authorization Type 4 Visits approved 11/18 - 12/22. 1/10-26-15-22 6 visits pending    Authorization - Visit Number 1   pt's initial MCD auth for 3 visits met on 01/19/20, new auth for 12 visits (02/02/20-03/28/20   Authorization - Number of Visits 6    PT Start Time 1535    PT Stop Time 1618    PT Time Calculation (min) 43 min    Equipment Utilized During Treatment --    Activity Tolerance Patient tolerated treatment well    Behavior During Therapy [facility] for tasks assessed/performed           Past Medical History:  Diagnosis Date  . 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 Depression   . DVT (deep venous thrombosis) ([place])    BLE DVT 07/21/19, 08/02/19; s/p retrievable IVC filter 07/21/19  . Hemorrhagic stroke ([place])   . History of kidney stones   . Hypertension   . 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 PE (pulmonary thromboembolism) ([place])    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  . Stroke [facility])    RICA, R A1, R MCA occlusion 07/19/19    Past Surgical History:  Procedure Laterality Date  . CRANIOPLASTY Right 06/05/2020   Procedure: CRANIOPLASTY;  Surgeon: Consuella Lose, MD;  Location: [facility];  Service: Neurosurgery;  Laterality: Right;  right  . CRANIOTOMY Right 07/19/2019   Procedure: RIGHT HEMI-CRANIECTOMY With implantation of skull flap to abdominal wall;  Surgeon: Consuella Lose, MD;  Location: [facility];  Service: Neurosurgery;  Laterality: Right;  . CYST EXCISION N/A 10/08/2016   Procedure: EXCISION OF POSTERIOR NECK CYST;   Surgeon: Clovis Riley, MD;  Location: [facility];  Service: General;  Laterality: N/A;  . CYSTOSCOPY/URETEROSCOPY/HOLMIUM LASER/STENT PLACEMENT Right 03/16/2020   Procedure: CYSTOSCOPY RIGHT RETROGRADE PYELOGRAM URETEROSCOPY/HOLMIUM LASER/STENT PLACEMENT;  Surgeon: Lucas Mallow, MD;  Location: [facility];  Service: Urology;  Laterality: Right;  . INCISION AND DRAINAGE ABSCESS N/A 09/22/2014   Procedure: INCISION AND DRAINAGE ABSCESS POSTERIOR NECK;  Surgeon: Pedro Earls, MD;  [facility];  Service: General;  Laterality: N/A;  . INCISION AND DRAINAGE ABSCESS N/A 12/20/2015   Procedure: INCISION AND DRAINAGE POSTERIOR NECK MASS;  Surgeon: Armandina Gemma, MD;  Location[facility];  Service: General;  Laterality: N/A;  . INCISION AND DRAINAGE ABSCESS Left 07/10/2004   middle finger  . IR IVC FILTER PLMT / S&I /IMG GUID/MOD SED  07/21/2019  . IR RADIOLOGIST EVAL & MGMT  12/14/2019  . IR VENOGRAM RENAL UNI RIGHT  07/21/2019  . MASS EXCISION N/A 07/21/2017   Procedure: EXCISION OF BENIGN NECK LESION WITH LAYERED CLOSURE;  Surgeon: Irene Limbo, MD;  Location: [facility];  Service: Plastics;  Laterality: N/A;  . MASS EXCISION N/A 11/10/2017   Procedure: EXCISION BENIGN LESION OF THE NECK WITH LAYERED CLOSURE;  Surgeon: Irene Limbo, MD;  Location: [facility] [place];  Service: Plastics;  Laterality: N/A;    There were no vitals filed for this visit.  Subjective Assessment - 12/05/20 1538    Subjective Pt reports doing well. His father-in-law reports that he is going to try to order new arm rest for old chair so that they can get back in that chair which fits legs better. Wife still trying to get new chair that will allow for proper foot rests but won't ship until February time. Pt reports that left shoulder still throbbing more at night.    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 No/denies    Pain Onset 1 to 4 weeks ago                             [facility] Adult PT Treatment/Exercise - 12/05/20 1546      Bed Mobility   Bed Mobility Rolling Right;Right Sidelying to Sit;Sit to Supine    Rolling Right Supervision/verbal cueing    Right Sidelying to Sit Minimal Assistance - Patient > 75%    Right Sidelying to Sit Details (indicate cue type and reason) Pt needed min assist to get LLE off mat and cued to be sure that leg is off before he pushes up.    Sit to Supine Contact Guard/Touching assist    Sit to Supine - Details (indicate cue type and reason) Pt was cued to hook right leg under left to help lift on to mat.      Transfers   Transfers Stand Pivot Transfers    Stand Pivot Transfers 4: Min guard    Stand Pivot Transfer Details (indicate cue type and reason) w/c to/from mat with most all weight on RLE during transfer.      Ambulation/Gait   Ambulation/Gait --      Exercises   Exercises Other Exercises    Other Exercises  PT discussed positioning of left shoulder in supine with pillow under shoulder to provide support and demonstrated with pt. Pt did report shoulder felt ok with pillow under it. Performed hooklying left hip IR for external rotators stretching 30 sec x 3. Pt only able to get to about neutral hip position before reporting some discomfort in left lateral hip. PT provided STM  to left hip during stretching. Bridges x 10 with PT assisting to get hip in to correct position on left and verbal and tactile cues to lift more on left with increased left WB. Pt initially lifting only with RLE so had him straighten right leg some which did help encourage more LLE use. Supine left hip flexor stretch with overpressure with PT just helping to get left leg completely straight. Pt able to feel stretch in this position. Sidelying right for left hip flexor stretch 30 sec x 3. Pt's hip lacking about 10-20 degrees from neutral extension.  PT Education - 12/05/20 1637    Education Details PT added hip IR stretch for external rotators to perform with family and supine left hip flexor stretch with flattening hospital bed. Discussed resuming bridges as well. PT reprinted prior HEP.    Person(s) Educated Patient;Caregiver(s)    Methods Explanation;Demonstration;Handout    Comprehension Verbalized understanding            PT Short Term Goals - 09/19/20 1430      PT SHORT TERM GOAL #1   Title STG=LTGs             PT Long Term Goals - 11/15/20 0952      PT LONG TERM GOAL #1   Title Pt will increase gait speed from 0.34ms to >0.256m for improved gait safety.    Baseline 11/14/20 0.1172m   Time 6    Period Weeks    Status Revised    Target Date 01/17/21      PT LONG TERM GOAL #2   Title Pt will be able to negotiate 4 steps with right rail min assist to be able to perform with family at home for improved access to home.    Baseline max assist with right rail on 11/14/20    Time 6    Period Weeks    Status New    Target Date 01/17/21      PT LONG TERM GOAL #3   Title Pt will ambulate >200' with LRAD CGA for improved mobility.    Baseline 11/14/20 115' with large based quad cane CGA and then min assist at times to try to facilitate more left weight shift.    Time 6    Period Weeks    Status On-going    Target Date 01/17/21      PT LONG TERM  GOAL #4   Title Pt will decrease TUG to <1 min for improved turning and improved gait speed.    Baseline 1 min, 18 s best trial with LBQC on 09/19/20. 11/14/20 TUG=65 sec    Time 6    Period Weeks    Status On-going    Target Date 01/17/21                 Plan - 12/05/20 1639    Clinical Impression Statement PT focused on adding in more left hip stretching for more hip IR and extension to try to aide in more upright posture and with gait. Pt has limited left hip motion and reports some discomfort if stretch too far.    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 08/20/19 w aspiration 08/21/19, (tx empircally w vanco/ cefepime), LLE DVT 07/21/19 and small left lower lung PE 08/01/19 s/p IVC filter 07/21/19, iv heparin discontinued due to worsening ICH 08/14/19, and abdominal wall hematoma, seizure, pseudomonas uti,    Examination-Activity Limitations Bathing;Sit;Bed Mobility;Bend;Squat;Caring for Others;Stairs;Stand;Carry;Toileting;Dressing;Transfers;Hygiene/Grooming;Locomotion Level    Examination-Participation Restrictions Driving;Other;Meal Prep    Rehab Potential Good    PT Frequency 1x / week    PT Duration 6 weeks    PT Treatment/Interventions ADLs/Self Care Home Management;Aquatic Therapy;Biofeedback;Balance training;Therapeutic exercise;Therapeutic activities;Functional mobility training;Wheelchair mobility training;Orthotic Fit/Training;Stair training;Gait training;DME Instruction;Patient/family education;Cognitive remediation;Neuromuscular re-education;Vestibular;Manual techniques;Passive range of motion;Cryotherapy;Moist Heat    PT Next Visit Plan How did new  stretches go on HEP.  WB through LLE.   Continue with gait training with LBQC.  Stair negotiation with R rail and step to sequence as this is big goal for pt.    Recommended Other Services PT requesting OT order    Consulted and Agree with Plan of Care Patient           Patient will benefit from skilled therapeutic intervention in order to improve the following deficits and impairments:  Abnormal gait,Difficulty walking,Decreased mobility,Decreased balance,Decreased cognition,Decreased range of motion,Decreased coordination,Decreased safety awareness,Impaired flexibility,Postural dysfunction,Decreased knowledge of use of DME,Decreased strength,Impaired UE functional use,Pain,Impaired tone,Impaired sensation,Decreased endurance  Visit Diagnosis: Other abnormalities of gait and mobility  Muscle weakness (generalized)  Hemiplegia and hemiparesis following cerebral infarction affecting left non-dominant side [facility])     Problem List Patient Active Problem List   Diagnosis Date Noted  . S/P craniotomy 06/05/2020  . History of cranioplasty 06/05/2020  . Peri-rectal abscess 02/14/2020  . Abnormal CT scan, pelvis 02/14/2020  . Pancytopenia ([place]) 12/02/2019  . Nephrolithiasis 12/02/2019  . Hydronephrosis with renal and ureteral calculus obstruction 12/02/2019  . Rectal pain 12/02/2019  . Hyperkalemia 12/02/2019  . Reactive depression   . Wound infection after surgery   . Sleep disturbance   . Dysphagia, post-stroke   . Transaminitis   . 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 Cerebral abscess   . Urinary tract infection without hematuria   . Altered mental status   . Primary hypercoagulable state ([place])   . Acute pulmonary embolism without acute cor pulmonale (HCC)   . Deep vein thrombosis (DVT) of non-extremity vein   . Hypokalemia   . Acute blood loss anemia   . Leukocytosis   . Endotracheal tube present   . Acute respiratory failure with hypoxemia ([place])   . Stroke (cerebrum) ([place]) 07/19/2019  .  Pressure injury of skin 07/19/2019  . Acute CVA (cerebrovascular accident) ([place])   . Encephalopathy   . Dysphagia   . Acute encephalopathy   . Essential hypertension   . Obesity 03/12/2016  . Scalp abscess 12/20/2015  . Neck abscess 12/20/2015  . Pilonidal cyst 02/08/2013    Electa Sniff, PT, DPT, NCS 12/05/2020, 4:42 PM  [facility] [address] [place], [place], [address] Phone: [phone]   Fax:  [phone]  Name: Matthew Black MRN: [id number] Date of Birth: 02/12/1976

## 2020-12-06 ENCOUNTER — Other Ambulatory Visit: Payer: Self-pay | Admitting: Physical Medicine and Rehabilitation

## 2020-12-07 ENCOUNTER — Other Ambulatory Visit: Payer: Self-pay

## 2020-12-07 ENCOUNTER — Encounter
Payer: Medicaid Other | Attending: Physical Medicine and Rehabilitation | Admitting: Physical Medicine and Rehabilitation

## 2020-12-07 VITALS — BP 117/77 | HR 67 | Temp 98.1°F | Ht 72.0 in

## 2020-12-07 DIAGNOSIS — I69352 Hemiplegia and hemiparesis following cerebral infarction affecting left dominant side: Secondary | ICD-10-CM | POA: Insufficient documentation

## 2020-12-07 DIAGNOSIS — R4689 Other symptoms and signs involving appearance and behavior: Secondary | ICD-10-CM | POA: Diagnosis not present

## 2020-12-07 NOTE — Progress Notes (Signed)
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 This includes bleeding, bruising, infection, excessive weakness, or medication side effects. A REMS form is on file and signed.  The left pectoralis major muscle was visualized under ultrasound guidance with aseptic technique and 50U of Botox was injected into the muscle. The remaining 50U of Botox has been discarded, as patient has had significant improvements in his upper extremity spasticity through his current stretching regimen.  The patient tolerated the procedure well. Post procedure instructions were given. A followup appointment was made.

## 2020-12-08 ENCOUNTER — Encounter: Payer: Self-pay | Admitting: Physical Medicine and Rehabilitation

## 2020-12-12 ENCOUNTER — Ambulatory Visit: Payer: Medicaid Other

## 2020-12-14 ENCOUNTER — Other Ambulatory Visit: Payer: Self-pay

## 2020-12-14 ENCOUNTER — Ambulatory Visit: Payer: Medicaid Other

## 2020-12-14 DIAGNOSIS — M6281 Muscle weakness (generalized): Secondary | ICD-10-CM | POA: Diagnosis not present

## 2020-12-14 DIAGNOSIS — I69354 Hemiplegia and hemiparesis following cerebral infarction affecting left non-dominant side: Secondary | ICD-10-CM

## 2020-12-14 DIAGNOSIS — R208 Other disturbances of skin sensation: Secondary | ICD-10-CM | POA: Diagnosis not present

## 2020-12-14 DIAGNOSIS — R41840 Attention and concentration deficit: Secondary | ICD-10-CM | POA: Diagnosis not present

## 2020-12-14 DIAGNOSIS — G8929 Other chronic pain: Secondary | ICD-10-CM | POA: Diagnosis not present

## 2020-12-14 DIAGNOSIS — M25512 Pain in left shoulder: Secondary | ICD-10-CM | POA: Diagnosis not present

## 2020-12-14 DIAGNOSIS — R2689 Other abnormalities of gait and mobility: Secondary | ICD-10-CM

## 2020-12-14 DIAGNOSIS — M25612 Stiffness of left shoulder, not elsewhere classified: Secondary | ICD-10-CM | POA: Diagnosis not present

## 2020-12-14 NOTE — Therapy (Signed)
[facility] [address] [place], [place], [address] Phone: [phone]   Fax:  [phone]  Physical Therapy Treatment  Patient Details  Name: Matthew Black MRN: [id number] Date of Birth: 1975/12/19 Referring Provider (PT): Dr. Naaman Plummer but f/u with Dr. Ranell Patrick   Encounter Date: 12/14/2020   PT End of Session - 12/14/20 1240    Visit Number 29    Number of Visits 33    Authorization Type 4 Visits approved 11/18 - 12/22. 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   Depression    DVT (deep venous thrombosis) ([place])    BLE DVT 07/21/19, 08/02/19; s/p retrievable IVC filter 07/21/19   Hemorrhagic stroke ([place])    History of kidney stones    Hypertension    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   PE (pulmonary thromboembolism) ([place])    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   Stroke ([place])    RICA, R A1, R MCA occlusion 07/19/19    Past Surgical History:  Procedure Laterality Date   CRANIOPLASTY Right 06/05/2020   Procedure: CRANIOPLASTY;  Surgeon: Consuella Lose, MD;  Location: [facility];  Service: Neurosurgery;  Laterality: Right;  right   CRANIOTOMY Right 07/19/2019   Procedure: RIGHT HEMI-CRANIECTOMY With implantation of skull flap to abdominal wall;  Surgeon: Consuella Lose, MD;  Location: [facility];  Service: Neurosurgery;  Laterality: Right;   CYST EXCISION N/A 10/08/2016   Procedure: EXCISION OF POSTERIOR NECK  CYST;  Surgeon: Clovis Riley, MD;  [facility];  Service: General;  Laterality: N/A;   CYSTOSCOPY/URETEROSCOPY/HOLMIUM LASER/STENT PLACEMENT Right 03/16/2020   Procedure: CYSTOSCOPY RIGHT RETROGRADE PYELOGRAM URETEROSCOPY/HOLMIUM LASER/STENT PLACEMENT;  Surgeon: Lucas Mallow, MD;  [facility];  Service: Urology;  Laterality: Right;   INCISION AND DRAINAGE ABSCESS N/A 09/22/2014   Procedure: INCISION AND DRAINAGE ABSCESS POSTERIOR NECK;  Surgeon: Pedro Earls, MD;  [facility];  Service: General;  Laterality: N/A;   INCISION AND DRAINAGE ABSCESS N/A 12/20/2015   Procedure: INCISION AND DRAINAGE POSTERIOR NECK MASS;  Surgeon: Armandina Gemma, MD;  [facility];  Service: General;  Laterality: N/A;   INCISION AND DRAINAGE ABSCESS Left 07/10/2004   middle finger   IR IVC FILTER PLMT / S&I /IMG GUID/MOD SED  07/21/2019   IR RADIOLOGIST EVAL & MGMT  12/14/2019   IR VENOGRAM RENAL UNI RIGHT  07/21/2019   MASS EXCISION N/A 07/21/2017   Procedure: EXCISION OF BENIGN NECK LESION WITH LAYERED CLOSURE;  Surgeon: Irene Limbo, MD;  Location: [facility];  Service: Plastics;  Laterality: N/A;   MASS EXCISION N/A 11/10/2017   Procedure: EXCISION BENIGN LESION OF THE NECK WITH LAYERED CLOSURE;  Surgeon: Irene Limbo, MD;  Location: [facility];  Service: Plastics;  Laterality: N/A;    There were no vitals filed for this  visit.   Subjective Assessment - 12/14/20 1241    Subjective Pt reports doing well. Had botox injection in left pec but hasn't noticed much of a change painwise. OT referral has come back so will get him scheduled.    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 08/20/19 w aspiration 08/21/19, (tx empircally w vanco/ cefepime), LLE DVT 07/21/19 and small left lower lung PE 08/01/19 s/p IVC filter 07/21/19, iv heparin discontinued due to worsening ICH 08/14/19, and abdominal wall hematoma, seizure, pseudomonas uti,    Patient Stated Goals to be able to walk, use my L hand, improve balance, walk up the stairs to the bedroom    Pain Onset 1 to 4 weeks ago                             [facility] Adult PT Treatment/Exercise - 12/14/20 1242      Transfers   Transfers Sit to Stand;Stand to Sit    Sit to Stand 5: Supervision;4: Min guard    Sit to Stand Details Verbal cues for technique    Sit to Stand Details (indicate cue type and reason) Pt was cued to bring back left foot first and then lean forward to help with rising.    Stand to Sit 5: Supervision;4: Min guard      Ambulation/Gait   Ambulation/Gait Yes    Ambulation/Gait Assistance 4: Min guard    Ambulation/Gait Assistance Details PT provided tactile cues at pelvis to try to facilitate some anterior pelvic rotation on left during swing phase as tends to keep posteriorly rotated. Also given verbal cues to increase stance time on left and step gentler on RLE during second bout as pt was reporting pain in arch of right foot. Pt reports that this did help some. PT also discussed that it may be time for new shoes to provide more arch support. Also instructed in massage to bottom of foot with tennis ball.    Ambulation Distance (Feet) 80 Feet   35' x 1   Assistive device Large base quad cane   left AFO   Gait Pattern Step-to pattern;Decreased stance time - left;Left flexed knee in stance    Ambulation Surface Level;Indoor    Stairs Yes    Stairs Assistance 3: Mod assist;4: Min assist   2nd person for safety   Stairs Assistance Details (indicate cue type and reason) Verbal and tactile cues to try to increase left weight shift. Pt taking very quick right step with heavy UE support with  ascent. Less assistance required with descent.    Stair Management Technique Step to pattern;One rail Right    Number of Stairs 4    Height of Stairs 6    Pre-Gait Activities Step-ups on 4" step then off forward in // bars with RUE support min assist with tactile cues to tighten left quad more to increase weight shift. Performed x 6 bouts. Pt with heavy RUE support on bar.                    PT Short Term Goals - 09/19/20 1430      PT SHORT TERM GOAL #1   Title STG=LTGs  PT Long Term Goals - 11/15/20 4270      PT LONG TERM GOAL #1   Title Pt will increase gait speed from 0.53ms to >0.281m for improved gait safety.    Baseline 11/14/20 0.1159m   Time 6    Period Weeks    Status Revised    Target Date 01/17/21      PT LONG TERM GOAL #2   Title Pt will be able to negotiate 4 steps with right rail min assist to be able to perform with family at home for improved access to home.    Baseline max assist with right rail on 11/14/20    Time 6    Period Weeks    Status New    Target Date 01/17/21      PT LONG TERM GOAL #3   Title Pt will ambulate >200' with LRAD CGA for improved mobility.    Baseline 11/14/20 115' with large based quad cane CGA and then min assist at times to try to facilitate more left weight shift.    Time 6    Period Weeks    Status On-going    Target Date 01/17/21      PT LONG TERM GOAL #4   Title Pt will decrease TUG to <1 min for improved turning and improved gait speed.    Baseline 1 min, 18 s best trial with LBQC on 09/19/20. 11/14/20 TUG=65 sec    Time 6    Period Weeks    Status On-going    Target Date 01/17/21                 Plan - 12/14/20 1341    Clinical Impression Statement PT focused on gait and stair training today. Pt still relying heavily on RUE support with step-ups with decreased left weight shift. Less assistance required with descent on steps today.    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 08/20/19 w aspiration 08/21/19, (tx empircally w vanco/ cefepime), LLE DVT 07/21/19 and small left lower lung PE 08/01/19 s/p IVC filter 07/21/19, iv heparin discontinued due to worsening ICH 08/14/19, and abdominal wall hematoma, seizure, pseudomonas uti,    Examination-Activity Limitations Bathing;Sit;Bed Mobility;Bend;Squat;Caring for Others;Stairs;Stand;Carry;Toileting;Dressing;Transfers;Hygiene/Grooming;Locomotion Level    Examination-Participation Restrictions Driving;Other;Meal Prep    Rehab Potential Good    PT Frequency 1x / week    PT Duration 6 weeks    PT Treatment/Interventions ADLs/Self Care Home Management;Aquatic Therapy;Biofeedback;Balance training;Therapeutic exercise;Therapeutic activities;Functional mobility training;Wheelchair mobility training;Orthotic Fit/Training;Stair training;Gait training;DME Instruction;Patient/family education;Cognitive remediation;Neuromuscular re-education;Vestibular;Manual techniques;Passive range of motion;Cryotherapy;Moist Heat    PT Next Visit Plan WB through LLE.  Continue with gait training with LBQC.  Stair negotiation with R rail and step to sequence as this is big goal for pt.    Consulted and Agree with Plan of Care Patient           Patient will benefit from skilled therapeutic intervention in order to improve the following deficits and impairments:  Abnormal gait,Difficulty walking,Decreased mobility,Decreased balance,Decreased cognition,Decreased range of motion,Decreased coordination,Decreased safety awareness,Impaired flexibility,Postural dysfunction,Decreased knowledge of use of DME,Decreased strength,Impaired UE functional use,Pain,Impaired tone,Impaired  sensation,Decreased endurance  Visit Diagnosis: Other abnormalities of gait and mobility  Muscle weakness (generalized)  Hemiplegia and hemiparesis following cerebral infarction affecting left non-dominant  side (HCC) ° ° ° ° °Problem List °Patient Active Problem List  ° Diagnosis Date Noted  °• S/P craniotomy 06/05/2020  °• History of cranioplasty 06/05/2020  °• Peri-rectal abscess 02/14/2020  °• Abnormal CT scan, pelvis 02/14/2020  °• Pancytopenia (HCC) 12/02/2019  °• Nephrolithiasis 12/02/2019  °• Hydronephrosis with renal and ureteral calculus obstruction 12/02/2019  °• Rectal pain 12/02/2019  °• Hyperkalemia 12/02/2019  °• Reactive depression   °• Wound infection after surgery   °• Sleep disturbance   °• Dysphagia, post-stroke   °• Transaminitis   °• 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  °• Cerebral abscess   °• Urinary tract infection without hematuria   °• Altered mental status   °• Primary hypercoagulable state (HCC)   °• Acute pulmonary embolism without acute cor pulmonale (HCC)   °• Deep vein thrombosis (DVT) of non-extremity vein   °• Hypokalemia   °• Acute blood loss anemia   °• Leukocytosis   °• Endotracheal tube present   °• Acute respiratory failure with hypoxemia (HCC)   °• Stroke (cerebrum) (HCC) 07/19/2019  °• Pressure injury of skin 07/19/2019  °• Acute CVA (cerebrovascular accident) (HCC)   °• Encephalopathy   °• Dysphagia   °• Acute encephalopathy   °• Essential hypertension   °• Obesity 03/12/2016  °• Scalp abscess 12/20/2015  °• Neck abscess 12/20/2015  °• Pilonidal cyst 02/08/2013  ° ° °Emily A Parker, PT, DPT, NCS °12/14/2020, 1:42 PM ° °[facility] °[facility] °[address] °[place], [place], [address] °Phone: [phone]   Fax:  [phone] ° °Name:  K Sarvis °MRN: [id number] °Date of Birth: 11/06/1976 ° ° °

## 2020-12-15 ENCOUNTER — Other Ambulatory Visit: Payer: Self-pay | Admitting: Medical

## 2020-12-19 ENCOUNTER — Ambulatory Visit: Payer: Medicaid Other

## 2020-12-19 ENCOUNTER — Other Ambulatory Visit: Payer: Self-pay

## 2020-12-19 ENCOUNTER — Ambulatory Visit: Payer: Medicaid Other | Admitting: Physical Therapy

## 2020-12-19 ENCOUNTER — Encounter: Payer: Self-pay | Admitting: Physical Therapy

## 2020-12-19 ENCOUNTER — Ambulatory Visit: Payer: Medicaid Other | Admitting: Occupational Therapy

## 2020-12-19 ENCOUNTER — Encounter: Payer: Self-pay | Admitting: Occupational Therapy

## 2020-12-19 DIAGNOSIS — M25512 Pain in left shoulder: Secondary | ICD-10-CM | POA: Diagnosis not present

## 2020-12-19 DIAGNOSIS — M6281 Muscle weakness (generalized): Secondary | ICD-10-CM

## 2020-12-19 DIAGNOSIS — R41840 Attention and concentration deficit: Secondary | ICD-10-CM | POA: Diagnosis not present

## 2020-12-19 DIAGNOSIS — I69354 Hemiplegia and hemiparesis following cerebral infarction affecting left non-dominant side: Secondary | ICD-10-CM

## 2020-12-19 DIAGNOSIS — M25612 Stiffness of left shoulder, not elsewhere classified: Secondary | ICD-10-CM

## 2020-12-19 DIAGNOSIS — G8929 Other chronic pain: Secondary | ICD-10-CM

## 2020-12-19 DIAGNOSIS — R208 Other disturbances of skin sensation: Secondary | ICD-10-CM | POA: Diagnosis not present

## 2020-12-19 DIAGNOSIS — R2689 Other abnormalities of gait and mobility: Secondary | ICD-10-CM | POA: Diagnosis not present

## 2020-12-19 NOTE — Therapy (Signed)
[facility] [address] [place], [place], [address] Phone: [phone]   Fax:  [phone]  Occupational Therapy Evaluation  Patient Details  Name: Matthew Black MRN: [id number] Date of Birth: 1976/04/19 Referring Provider (OT): Dr Ranell Patrick   Encounter Date: 12/19/2020   OT End of Session - 12/19/20 1808    Visit Number 1    Number of Visits 9    Date for OT Re-Evaluation 03/04/21    Authorization Type BC[employer] Medicaid - need submission, pending auth    Progress Note Due on Visit 10    OT Start Time 1615    OT Stop Time 1700    OT Time Calculation (min) 45 min    Activity Tolerance Patient tolerated treatment well    Behavior During Therapy [facility] for tasks assessed/performed           Past Medical History:  Diagnosis Date  . 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 Depression   . DVT (deep venous thrombosis) ([place])    BLE DVT 07/21/19, 08/02/19; s/p retrievable IVC filter 07/21/19  . Hemorrhagic stroke ([place])   . History of kidney stones   . Hypertension   . 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 PE (pulmonary thromboembolism) ([place])    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  . Stroke [facility])    RICA, R A1, R MCA occlusion 07/19/19    Past Surgical History:  Procedure Laterality Date  . CRANIOPLASTY Right 06/05/2020   Procedure: CRANIOPLASTY;  Surgeon: Consuella Lose, MD;  Location: [facility];  Service: Neurosurgery;  Laterality: Right;  right  . CRANIOTOMY Right 07/19/2019   Procedure: RIGHT HEMI-CRANIECTOMY With implantation of skull flap to abdominal wall;  Surgeon: Consuella Lose, MD;  Location: [facility];  Service: Neurosurgery;  Laterality: Right;  . CYST EXCISION N/A 10/08/2016   Procedure: EXCISION OF POSTERIOR NECK CYST;  Surgeon: Clovis Riley, MD;  [facility];  Service: General;  Laterality: N/A;  . CYSTOSCOPY/URETEROSCOPY/HOLMIUM LASER/STENT PLACEMENT Right 03/16/2020   Procedure:  CYSTOSCOPY RIGHT RETROGRADE PYELOGRAM URETEROSCOPY/HOLMIUM LASER/STENT PLACEMENT;  Surgeon: Lucas Mallow, MD;  [facility];  Service: Urology;  Laterality: Right;  . INCISION AND DRAINAGE ABSCESS N/A 09/22/2014   Procedure: INCISION AND DRAINAGE ABSCESS POSTERIOR NECK;  Surgeon: Pedro Earls, MD;  [facility];  Service: General;  Laterality: N/A;  . INCISION AND DRAINAGE ABSCESS N/A 12/20/2015   Procedure: INCISION AND DRAINAGE POSTERIOR NECK MASS;  Surgeon: Armandina Gemma, MD;  [facility];  Service: General;  Laterality: N/A;  . INCISION AND DRAINAGE ABSCESS Left 07/10/2004   middle finger  . IR IVC FILTER PLMT / S&I /IMG GUID/MOD SED  07/21/2019  . IR RADIOLOGIST EVAL & MGMT  12/14/2019  . IR VENOGRAM RENAL UNI RIGHT  07/21/2019  . MASS EXCISION N/A 07/21/2017   Procedure: EXCISION OF BENIGN NECK LESION WITH LAYERED CLOSURE;  Surgeon: Irene Limbo, MD;  Location: [facility];  Service: Plastics;  Laterality: N/A;  . MASS EXCISION N/A 11/10/2017   Procedure: EXCISION BENIGN LESION OF THE NECK WITH LAYERED CLOSURE;  Surgeon: Irene Limbo, MD;  Location: [facility];  Service: Plastics;  Laterality: N/A;    There were no vitals filed for this visit.   Subjective Assessment - 12/19/20 1620    Subjective  My arm is messing with my balance.  Shoulder pain - mostly at night    Pertinent History CVA s/p  hemicaniectomy 07/19/19 with abdominal flap implant.  PMH: hx of seizures and headaches s/p CVA, hx of DVT with IVC filter placement    Limitations fall risk, hx of seizures    Patient Stated Goals move/use LUE    Currently in Pain? No/denies    Pain Score 0-No pain             [facility] OT Assessment - 12/19/20 0001      Assessment   Medical Diagnosis spastic left hemiplegia    Referring Provider (OT) Dr Ranell Patrick    Onset Date/Surgical Date 12/07/20   botox injection   Hand Dominance Right    Prior Therapy OP, CIR      Prior Function    Level of Independence Independent with basic ADLs    Vocation Full time employment    Administrator    Leisure play with kids - 71 yr old daughter and 38 yr old son      ADL   Eating/Feeding Minimal assistance    Grooming Modified independent    Upper Body Bathing Modified independent    Lower Body Bathing Modified independent    Upper Body Dressing Moderate assistance    Lower Body Dressing Moderate assistance    Toilet Transfer Modified independent    Toileting - Clothing Manipulation Modified independent    Toileting -  Hygiene Modified Independent    Tub/Shower Transfer Moderate assistance      IADL   Prior Level of Function Shopping Independent    Prior Level of Function Light Housekeeping independent    Light Housekeeping Performs light daily tasks such as dishwashing, bed making    Prior Level of Function Meal Prep Independent    Meal Prep Able to complete simple warm meal prep    Prior Level of Function Passenger transport manager own vehicle    Prior Level of Function Medication Managment independent    Medication Management Takes responsibility if medication is prepared in advance in seperate dosage      Written Expression   Dominant Hand Right      Vision Assessment   Eye Alignment Within Functional Limits    Ocular Range of Motion Within Functional Limits    Alignment/Gaze Preference Within Defined Limits    Tracking/Visual Pursuits Able to track stimulus in all quads without difficulty      Cognition   Overall Cognitive Status Impaired/Different from baseline    Attention Selective    Executive Function Initiating    Cognition Comments Significant delay to motor, verbal response      Observation/Other Assessments   Focus on Therapeutic Outcomes (FOTO)  NA      Posture/Postural Control   Posture/Postural Control Postural limitations    Postural Limitations Forward head;Rounded Shoulders;Increased  thoracic kyphosis;Posterior pelvic tilt      Sensation   Light Touch Impaired by gross assessment      Coordination   Gross Motor Movements are Fluid and Coordinated No    Fine Motor Movements are Fluid and Coordinated No    Box and Blocks unable - left    Coordination Dense spastic left hemiplegia      Perception   Perception Impaired      Praxis   Praxis Impaired    Praxis Impairment Details Motor planning      Tone   Assessment Location Left Upper Extremity      ROM / Strength   AROM / PROM / Strength PROM  AROM   Overall AROM  Deficits    Overall AROM Comments Slight shoulder flex/ext      PROM   Overall PROM  Deficits    PROM Assessment Site Shoulder    Right/Left Shoulder Left    Left Shoulder Flexion 95 Degrees    Left Shoulder External Rotation 0 Degrees      LUE Tone   LUE Tone Mild                           OT Education - 12/19/20 1807    Education Details table slides for shoulder with emphasis on proper postural alignment    Person(s) Educated Patient    Methods Explanation;Demonstration;Tactile cues;Verbal cues;Handout    Comprehension Need further instruction            OT Short Term Goals - 12/19/20 1816      OT SHORT TERM GOAL #1   Title Patient will safely complete self range of motion exercises with min cueing    Baseline dependent    Time 4    Period Weeks    Status New    Target Date 02/02/21      OT SHORT TERM GOAL #2   Title Patient will put on pull over shirt with set up assistance    Baseline mod assist    Time 4    Period Weeks    Status New      OT SHORT TERM GOAL #3   Title Patient will understand positioning for left shoulder for pain relief in bed, and will be able to direct caregiver    Baseline dependent    Time 4    Period Weeks    Status New      OT SHORT TERM GOAL #4   Title Patient will demonstrate at least 15 degrees of shoulder external rotation passively to prevent further joint pain     Baseline 0 external rotation    Time 4    Status New             OT Long Term Goals - 12/19/20 1819      OT LONG TERM GOAL #1   Title Pt/wife will be independent with updated HEP.    Baseline not yet established    Time 8    Period Weeks    Status New    Target Date 03/04/21      OT LONG TERM GOAL #2   Title Patient will don pull over shirt with modified independence    Baseline mod assist    Time 8    Period Weeks    Status New      OT LONG TERM GOAL #3   Title Pt will demo at least 120* L shoulder flex PROM without pain for incr ease with ADLs    Baseline 95    Time 8    Period Weeks    Status New      OT LONG TERM GOAL #4   Title Patient will demonstrate at least 30 degrees of passive external rotation in left shoulder    Baseline 0    Time 8    Period Weeks    Status New      OT LONG TERM GOAL #5   Title xxx    Baseline xxx                 Plan - 12/19/20 1824    OT  Treatment/Interventions Self-care/ADL training;DME and/or AE instruction;Fluidtherapy;Moist Heat;Splinting;Balance training;Therapeutic activities;Aquatic Therapy;Ultrasound;Therapeutic exercise;Cognitive remediation/compensation;Passive range of motion;Functional Mobility Training;Neuromuscular education;Cryotherapy;Manual Therapy;Patient/family education;Visual/perceptual remediation/compensation;Energy conservation;Electrical Stimulation           Patient will benefit from skilled therapeutic intervention in order to improve the following deficits and impairments:   Body Structure / Function / Physical Skills: ADL,Dexterity,Balance,ROM,IADL,Mobility,Sensation,Strength,Tone,FMC,Coordination,Decreased knowledge of precautions,Decreased knowledge of use of DME,GMC,Pain,UE functional use,Flexibility,Improper spinal/pelvic alignment,Body mechanics,Endurance,Proprioception Cognitive Skills: Attention,Emotional,Energy/Drive     Visit Diagnosis: Hemiplegia and hemiparesis following  cerebral infarction affecting left non-dominant side ([place]) - Plan: Ot plan of care cert/re-cert  Spastic hemiplegia of left nondominant side as late effect of cerebral infarction [facility]) - Plan: Ot plan of care cert/re-cert  Chronic left shoulder pain - Plan: Ot plan of care cert/re-cert  Other disturbances of skin sensation - Plan: Ot plan of care cert/re-cert  Stiffness of left shoulder, not elsewhere classified - Plan: Ot plan of care cert/re-cert  Attention and concentration deficit - Plan: Ot plan of care cert/re-cert    Problem List Patient Active Problem List   Diagnosis Date Noted  . S/P craniotomy 06/05/2020  . History of cranioplasty 06/05/2020  . Peri-rectal abscess 02/14/2020  . Abnormal CT scan, pelvis 02/14/2020  . Pancytopenia ([place]) 12/02/2019  . Nephrolithiasis 12/02/2019  . Hydronephrosis with renal and ureteral calculus obstruction 12/02/2019  . Rectal pain 12/02/2019  . Hyperkalemia 12/02/2019  . Reactive depression   . Wound infection after surgery   . Sleep disturbance   . Dysphagia, post-stroke   . Transaminitis   . 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 Cerebral abscess   . Urinary tract infection without hematuria   . Altered mental status   . Primary hypercoagulable state ([place])   . Acute pulmonary embolism without acute cor pulmonale (HCC)   . Deep vein thrombosis (DVT) of non-extremity vein   . Hypokalemia   . Acute blood loss anemia   . Leukocytosis   . Endotracheal tube present   . Acute respiratory failure with hypoxemia ([place])   . Stroke (cerebrum) ([place]) 07/19/2019  . Pressure injury of skin 07/19/2019  . Acute CVA (cerebrovascular accident) ([place])   . Encephalopathy   . Dysphagia   . Acute encephalopathy   . Essential hypertension   . Obesity 03/12/2016  . Scalp abscess 12/20/2015  . Neck abscess 12/20/2015  . Pilonidal cyst 02/15/13  Managed medicaid CPT codes: 41962- Therapeutic Exercise, [phone]- Neuro Re-education,  [phone] - Manual Therapy, [address] - Therapeutic Activities, [phone] - Self Care, [phone] - Electrical stimulation (Manual), [id number] - Ultrasound, [id number] - Orthotic Fit and [phone] - Aquatic therapy   Mariah Milling, OTR/L 12/19/2020, 6:25 PM  [facility] [address] [place], [place], [address] Phone: [phone]   Fax:  [phone]  Name: Matthew Black MRN: [id number] Date of Birth: 13-Mar-1976

## 2020-12-19 NOTE — Patient Instructions (Signed)
Table slides Sitting at a table, let right hand assist left hand.   Place hands on a towel, so they can slide.    1)  Reach forward, sit tall - slide towel forward ten times.    2)  Reach forward, sit tall, slide towel forward and then rotate to the left and rotate to the right ten times

## 2020-12-20 NOTE — Therapy (Signed)
[facility] [address] [place], [place], [address] Phone: [phone]   Fax:  [phone]  Physical Therapy Treatment  Patient Details  Name: Matthew Black MRN: [id number] Date of Birth: Sep 29, 1976 Referring Provider (PT): Dr. Naaman Plummer but f/u with Dr. Ranell Patrick   Encounter Date: 12/19/2020   PT End of Session - 12/19/20 1536    Visit Number 30    Number of Visits 33    Authorization Type 4 Visits approved 11/18 - 12/22. 1/10-26-15-22 6 visits pending    Authorization - Visit Number 3   pt's initial MCD auth for 3 visits met on 01/19/20, new auth for 12 visits (02/02/20-03/28/20   Authorization - Number of Visits 6    PT Start Time 1532    PT Stop Time 1615    PT Time Calculation (min) 43 min    Equipment Utilized During Treatment Gait belt    Activity Tolerance Patient tolerated treatment well;Patient limited by fatigue    Behavior During Therapy [facility] for tasks assessed/performed           Past Medical History:  Diagnosis Date  . 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 Depression   . DVT (deep venous thrombosis) ([place])    BLE DVT 07/21/19, 08/02/19; s/p retrievable IVC filter 07/21/19  . Hemorrhagic stroke ([place])   . History of kidney stones   . Hypertension   . 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 PE (pulmonary thromboembolism) ([place])    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  . Stroke [facility])    RICA, R A1, R MCA occlusion 07/19/19    Past Surgical History:  Procedure Laterality Date  . CRANIOPLASTY Right 06/05/2020   Procedure: CRANIOPLASTY;  Surgeon: Consuella Lose, MD;  Location: [place];  Service: Neurosurgery;  Laterality: Right;  right  . CRANIOTOMY Right 07/19/2019   Procedure: RIGHT HEMI-CRANIECTOMY With implantation of skull flap to abdominal wall;  Surgeon: Consuella Lose, MD;  Location: [place];  Service: Neurosurgery;  Laterality: Right;  . CYST EXCISION N/A 10/08/2016   Procedure:  EXCISION OF POSTERIOR NECK CYST;  Surgeon: Clovis Riley, MD;  [facility];  Service: General;  Laterality: N/A;  . CYSTOSCOPY/URETEROSCOPY/HOLMIUM LASER/STENT PLACEMENT Right 03/16/2020   Procedure: CYSTOSCOPY RIGHT RETROGRADE PYELOGRAM URETEROSCOPY/HOLMIUM LASER/STENT PLACEMENT;  Surgeon: Lucas Mallow, MD;  [facility];  Service: Urology;  Laterality: Right;  . INCISION AND DRAINAGE ABSCESS N/A 09/22/2014   Procedure: INCISION AND DRAINAGE ABSCESS POSTERIOR NECK;  Surgeon: Pedro Earls, MD;  [facility];  Service: General;  Laterality: N/A;  . INCISION AND DRAINAGE ABSCESS N/A 12/20/2015   Procedure: INCISION AND DRAINAGE POSTERIOR NECK MASS;  Surgeon: Armandina Gemma, MD;  [facility];  Service: General;  Laterality: N/A;  . INCISION AND DRAINAGE ABSCESS Left 07/10/2004   middle finger  . IR IVC FILTER PLMT / S&I /IMG GUID/MOD SED  07/21/2019  . IR RADIOLOGIST EVAL & MGMT  12/14/2019  . IR VENOGRAM RENAL UNI RIGHT  07/21/2019  . MASS EXCISION N/A 07/21/2017   Procedure: EXCISION OF BENIGN NECK LESION WITH LAYERED CLOSURE;  Surgeon: Irene Limbo, MD;  Location: [place];  Service: Plastics;  Laterality: N/A;  . MASS EXCISION N/A 11/10/2017   Procedure: EXCISION BENIGN LESION OF THE NECK WITH LAYERED CLOSURE;  Surgeon: Irene Limbo, MD;  Location: [facility];  Service: Plastics;  Laterality: N/A;    There were no vitals  filed for this visit.   Subjective Assessment - 12/19/20 1535    Subjective No new complaitns. No falls or pain to report. Does report starting a new diet and feeling "sluggish with no energy".    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 No/denies               [facility] Adult PT Treatment/Exercise - 12/19/20 1547      Transfers   Transfers Sit to Stand;Stand to Sit    Sit to Stand 5: Supervision;4: Min guard    Stand to Sit 5: Supervision;4: Min guard      Ambulation/Gait   Ambulation/Gait Yes    Ambulation/Gait Assistance 4: Min guard    Ambulation/Gait Assistance Details cues for posture, cane placement, left stance time and increased right step length. facilation at pt's pelvis for anterior rotation to assist with improved gait mechanics.    Ambulation Distance (Feet) 40 Feet   x1   Assistive device Large base quad cane   left AFO   Gait Pattern Step-to pattern;Decreased stance time - left;Left flexed knee in stance    Ambulation Surface Level;Indoor    Pre-Gait Activities continued with forward step ups on 4 inch box/then step downs from 4 inch box in parallel bars wtih min assist, cues/faciliation for left quad activation to assist with weight shifting. after 3 reps pt needed to sit due to right LE fatigue.      Neuro Re-ed    Neuro Re-ed Details  in parallel bars working on increased left LE weight shifting/weight bearing with the following tasks: right forward/backward stepping over blue band on floor with mod/max assist to shift onto left LE progressign to stepping over yard stick with same cues/facilitation needed. Pt's left LE fatigued quickly with these activiites with rest breaks needed. Then worked on standing with right foot on 2 inch box with lateral weight shfiting progressing to equal weight on bil  LE's, then workiing on tall posture while staying in midline with use of mirror feedback. min/mod assist to obtain midline with min assist to maintain midline.                PT Short Term Goals - 09/19/20 1430      PT SHORT TERM GOAL #1   Title STG=LTGs             PT Long Term Goals - 11/15/20 5993      PT LONG TERM GOAL #1   Title Pt will increase gait speed from 0.46ms to >0.263m for improved gait safety.    Baseline 11/14/20 0.1153m   Time 6    Period Weeks    Status Revised    Target Date 01/17/21      PT LONG TERM GOAL #2   Title Pt will be  able to negotiate 4 steps with right rail min assist to be able to perform with family at home for improved access to home.    Baseline max assist with right rail on 11/14/20    Time 6    Period Weeks    Status New    Target Date 01/17/21      PT LONG TERM GOAL #3   Title Pt will ambulate >200' with LRAD CGA for improved mobility.    Baseline 11/14/20 115' with large based quad cane CGA and then min assist at times to try to facilitate more left weight shift.    Time 6    Period Weeks    Status On-going    Target Date 01/17/21      PT LONG TERM GOAL #4   Title Pt will decrease TUG to <1 min for improved turning and improved gait speed.    Baseline 1 min, 18 s best trial with LBQC on 09/19/20. 11/14/20 TUG=65 sec    Time 6    Period Weeks    Status On-going    Target Date 01/17/21                 Plan - 12/19/20 1537    Clinical Impression Statement Today's skilled session continued to focus on gait training, strengthening for stair negotiation and NMR to promote increased left LE weight bearing. Rest breaks taken as needed due to LE fatigue, otherwise no issues noted or reported. The pt should benefit from continued PT to progress toward unmet goals.    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 08/20/19 w aspiration 08/21/19, (tx empircally w vanco/ cefepime), LLE DVT 07/21/19 and small left lower lung PE 08/01/19 s/p IVC filter 07/21/19, iv heparin discontinued due to worsening ICH 08/14/19, and abdominal wall hematoma, seizure, pseudomonas uti,    Examination-Activity Limitations Bathing;Sit;Bed Mobility;Bend;Squat;Caring for Others;Stairs;Stand;Carry;Toileting;Dressing;Transfers;Hygiene/Grooming;Locomotion Level    Examination-Participation Restrictions Driving;Other;Meal Prep    Rehab Potential Good    PT Frequency 1x / week    PT Duration 6 weeks    PT Treatment/Interventions ADLs/Self Care Home Management;Aquatic Therapy;Biofeedback;Balance training;Therapeutic exercise;Therapeutic activities;Functional mobility training;Wheelchair mobility training;Orthotic Fit/Training;Stair training;Gait training;DME Instruction;Patient/family education;Cognitive remediation;Neuromuscular re-education;Vestibular;Manual techniques;Passive range of motion;Cryotherapy;Moist Heat    PT Next Visit Plan WB through LLE.  Continue with gait training with LBQC.  Stair negotiation with R rail and step to sequence as this is big goal for pt.    Consulted and Agree with Plan of Care Patient           Patient will benefit from skilled therapeutic intervention in order to improve the following deficits and impairments:  Abnormal gait,Difficulty walking,Decreased mobility,Decreased balance,Decreased cognition,Decreased range of motion,Decreased coordination,Decreased safety awareness,Impaired flexibility,Postural dysfunction,Decreased knowledge of use of DME,Decreased strength,Impaired UE functional use,Pain,Impaired tone,Impaired sensation,Decreased endurance  Visit Diagnosis: Other abnormalities of gait and mobility  Muscle weakness  (generalized)  Hemiplegia and hemiparesis following cerebral infarction affecting left non-dominant side [facility])     Problem List Patient Active Problem List   Diagnosis Date Noted  . S/P craniotomy 06/05/2020  . History of cranioplasty 06/05/2020  . Peri-rectal abscess 02/14/2020  . Abnormal CT scan, pelvis 02/14/2020  . Pancytopenia ([place]) 12/02/2019  . Nephrolithiasis 12/02/2019  . Hydronephrosis with renal and ureteral calculus  obstruction 12/02/2019  . Rectal pain 12/02/2019  . Hyperkalemia 12/02/2019  . Reactive depression   . Wound infection after surgery   . Sleep disturbance   . Dysphagia, post-stroke   . Transaminitis   . 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 Cerebral abscess   . Urinary tract infection without hematuria   . Altered mental status   . Primary hypercoagulable state ([place])   . Acute pulmonary embolism without acute cor pulmonale (H[facility])   . Deep vein thrombosis (DVT) of non-extremity vein   . Hypokalemia   . Acute blood loss anemia   . Leukocytosis   . Endotracheal tube present   . Acute respiratory failure with hypoxemia ([place])   . Stroke (cerebrum) ([place]) 07/19/2019  . Pressure injury of skin 07/19/2019  . Acute CVA (cerebrovascular accident) ([place])   . Encephalopathy   . Dysphagia   . Acute encephalopathy   . Essential hypertension   . Obesity 03/12/2016  . Scalp abscess 12/20/2015  . Neck abscess 12/20/2015  . Pilonidal cyst 02/08/2013    Willow Ora, PTA, [facility] [address] [place], [place] [address] [phone] 12/20/20, 10:56 AM   Name: MUBASHIR MALLEK MRN: [id number] Date of Birth: 04-10-1976

## 2020-12-21 ENCOUNTER — Ambulatory Visit
Admission: RE | Admit: 2020-12-21 | Discharge: 2020-12-21 | Disposition: A | Discharge status: Home / Self Care | Payer: Medicaid Other | Source: Ambulatory Visit | Attending: Diagnostic Radiology | Admitting: Diagnostic Radiology

## 2020-12-21 ENCOUNTER — Other Ambulatory Visit: Payer: Self-pay

## 2020-12-21 DIAGNOSIS — I824Y9 Acute embolism and thrombosis of unspecified deep veins of unspecified proximal lower extremity: Secondary | ICD-10-CM

## 2020-12-21 DIAGNOSIS — I639 Cerebral infarction, unspecified: Secondary | ICD-10-CM

## 2020-12-21 HISTORY — PX: IR RADIOLOGIST EVAL & MGMT: IMG5224

## 2020-12-21 NOTE — Progress Notes (Signed)
Chief Complaint: Patient was consulted remotely today (TeleHealth) for IVC filter follow-up.  Referring Physician(s): Esperanza Richters, PA-C  History of Present Illness: Matthew Black is a 45 y.o. male with history of right cerebral infarct requiring hemicraniectomy and implantation of skull flap into the abdominal wall.  The skull flap has been subsequently replaced by neurosurgery.  Bilateral lower extremity DVT was identified at the same time as a stroke.  Due to the DVT and stroke, an IVC filter was placed on 07/21/2019.  I had a TeleHeath visit with the patient on 12/14/2019.  We discussed IVC filter management and retrieval at that time.  The last time we spoke, the patient was still rather immobile and there were ongoing issues with a right ureter stone.  We decided to wait at least 6 months to reevaluate the patient's situation before discussing IVC filter retrieval.  According to the patient, he is doing much better.  He is taking daily steps using a cane.  He continues to have no use of his left arm secondary to the stroke.  He has no other major medical issues at this time.  Patient denies leg pain or swelling.  He has not been diagnosed with any additional DVT.  Past Medical History:  Diagnosis Date  . 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 Depression   . DVT (deep venous thrombosis) ([facility])    BLE DVT 07/21/19, 08/02/19; s/p retrievable IVC filter 07/21/19  . Hemorrhagic stroke ([facility])   . History of kidney stones   . Hypertension   . Paralysis ([facility])    LEFT SIDE  . PE (pulmonary thromboembolism) ([facility])    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  . Stroke [facility])    RICA, R A1, R MCA occlusion 07/19/19    Past Surgical History:  Procedure Laterality Date  . CRANIOPLASTY Right 06/05/2020   Procedure: CRANIOPLASTY;  Surgeon: Lisbeth Renshaw, MD;  Location: [facility] OR;  Service: Neurosurgery;  Laterality: Right;  right  . CRANIOTOMY Right 07/19/2019   Procedure:  RIGHT HEMI-CRANIECTOMY With implantation of skull flap to abdominal wall;  Surgeon: Lisbeth Renshaw, MD;  Location: [facility] OR;  Service: Neurosurgery;  Laterality: Right;  . CYST EXCISION N/A 10/08/2016   Procedure: EXCISION OF POSTERIOR NECK CYST;  Surgeon: Berna Bue, MD;  [facility];  Service: General;  Laterality: N/A;  . CYSTOSCOPY/URETEROSCOPY/HOLMIUM LASER/STENT PLACEMENT Right 03/16/2020   Procedure: CYSTOSCOPY RIGHT RETROGRADE PYELOGRAM URETEROSCOPY/HOLMIUM LASER/STENT PLACEMENT;  Surgeon: Crista Elliot, MD;  [facility];  Service: Urology;  Laterality: Right;  . INCISION AND DRAINAGE ABSCESS N/A 09/22/2014   Procedure: INCISION AND DRAINAGE ABSCESS POSTERIOR NECK;  Surgeon: Valarie Merino, MD;  [facility];  Service: General;  Laterality: N/A;  . INCISION AND DRAINAGE ABSCESS N/A 12/20/2015   Procedure: INCISION AND DRAINAGE POSTERIOR NECK MASS;  Surgeon: Darnell Level, MD;  [facility];  Service: General;  Laterality: N/A;  . INCISION AND DRAINAGE ABSCESS Left 07/10/2004   middle finger  . IR IVC FILTER PLMT / S&I /IMG GUID/MOD SED  07/21/2019  . IR RADIOLOGIST EVAL & MGMT  12/14/2019  . IR VENOGRAM RENAL UNI RIGHT  07/21/2019  . MASS EXCISION N/A 07/21/2017   Procedure: EXCISION OF BENIGN NECK LESION WITH LAYERED CLOSURE;  Surgeon: Glenna Fellows, MD;  Location: [facility];  Service: Plastics;  Laterality: N/A;  . MASS EXCISION N/A 11/10/2017   Procedure: EXCISION BENIGN LESION OF THE NECK WITH LAYERED  CLOSURE;  Surgeon: Irene Limbo, MD;  Location: [place];  Service: Plastics;  Laterality: N/A;    Allergies: Hydrocodone  Medications: Prior to Admission medications   Medication Sig Start Date End Date Taking? Authorizing Provider  acetaminophen (TYLENOL) 325 MG tablet Take 650 mg by mouth every 6 (six) hours as needed for moderate pain.     [provider]  atorvastatin (LIPITOR) 10 MG tablet Take 1  tablet (10 mg total) by mouth daily. 11/21/20   Saguier, Percell Miller, PA-C  baclofen (LIORESAL) 10 MG tablet Take 1 tablet (10 mg total) by mouth 2 (two) times daily. 11/20/20   Saguier, Percell Miller, PA-C  metoprolol tartrate (LOPRESSOR) 50 MG tablet Take 1 tablet (50 mg total) by mouth 2 (two) times daily. 10/10/20   Raulkar, Clide Deutscher, MD  venlafaxine XR (EFFEXOR-XR) 37.5 MG 24 hr capsule TAKE 1 CAPSULE BY MOUTH DAILY WITH BREAKFAST. 12/17/20   Saguier, Percell Miller, PA-C     Family History  Problem Relation Age of Onset  . Healthy Mother   . Healthy Father   . Clotting disorder Maternal Grandmother   . Diabetes Paternal Grandfather   . Clotting disorder Maternal Aunt   . Clotting disorder Maternal Uncle   . Colon cancer Neg Hx     Social History   Socioeconomic History  . Marital status: Married    Spouse name: Not on file  . Number of children: 2  . Years of education: Not on file  . Highest education level: Not on file  Occupational History  . Occupation: Clinical biochemist   Tobacco Use  . Smoking status: Former Smoker    Packs/day: 0.00    Quit date: 11/24/2015    Years since quitting: 5.0  . Smokeless tobacco: Never Used  . Tobacco comment:    Vaping Use  . Vaping Use: Never used  Substance and Sexual Activity  . Alcohol use: Not Currently  . Drug use: Never  . Sexual activity: Not Currently  Other Topics Concern  . Not on file  Social History Narrative   Household-- pt, wife, 2 children   Social Determinants of Health   Financial Resource Strain: Not on file  Food Insecurity: Not on file  Transportation Needs: Not on file  Physical Activity: Not on file  Stress: Not on file  Social Connections: Not on file   Review of Systems  Constitutional: Negative.   Neurological:       Prior stroke and unable to use left arm.     Physical Exam No direct physical exam was performed  Vital Signs: There were no vitals taken for this visit.  Imaging: No results  found.  Labs:  CBC: Recent Labs    01/20/20 1133 03/13/20 1342 06/01/20 1338 06/06/20 0417  WBC 5.9 5.7 5.9 16.8*  HGB 14.5 15.2 14.4 13.6  HCT 43.6 48.3 45.6 42.3  PLT 333 300 286 269    COAGS: No results for input(s): INR, APTT in the last 8760 hours.  BMP: Recent Labs    01/20/20 1133 03/13/20 1342 06/01/20 1338 06/06/20 0417 11/20/20 1512  NA 144 145 141 140 143  K 3.9 3.9 3.8 4.2 3.9  CL 104 109 110 105 106  CO2 26 26 21* 22 29  GLUCOSE 84 73 82 112* 63*  BUN 8 10 10 9 12   CALCIUM 9.7 9.5 9.3 8.9 9.4  CREATININE 0.98 0.94 0.95 0.98 1.26  GFRNONAA 95 >60 >60 >60  --   GFRAA 109 >60 >  60 >60  --     LIVER FUNCTION TESTS: Recent Labs    11/20/20 1512  BILITOT 0.4  AST 16  ALT 20  ALKPHOS 126*  PROT 7.7  ALBUMIN 4.5    TUMOR MARKERS: No results for input(s): AFPTM, CEA, CA199, CHROMGRNA in the last 8760 hours.  Assessment and Plan:  45 year old male with history of right CVA and bilateral lower extremity DVT in 2020.  An IVC filter was placed on 07/21/2019.  Lower extremity DVT was diagnosed at the same time as the stroke and probably a provoked DVT.  Based on my review of the chart, I do not see that the patient needs the IVC filter other than his decreased mobility.  According to the patient and his wife, patient is up and moving on a daily basis.  I explained to the patient and his wife that if the filter is not needed then we would prefer to remove it.  I explained the filters have a risk of migration, fracture and can actually increase the risk of lower extremity DVT and edema.  I will touch base with the patient's primary care provider to make sure that everyone is comfortable with removing the filter without anticoagulation.  In addition, we will schedule the patient for an outpatient lower extremity venous duplex to ensure the patient does not have any lower extremity DVT.  I explained the filter retrieval procedure with the patient and wife.  I  explained that the procedure is performed as an outpatient with moderate sedation.  Risks of the procedure include bleeding, infection, vascular injury and unsuccessful filter removal.  Patient and wife are comfortable with the procedure would like to proceed with filter retrieval.  Plan to schedule the patient for filter retrieval after the results of the lower extremity venous duplex.  Thank you for this interesting consult.  I greatly enjoyed meeting Matthew Black and look forward to participating in their care.  A copy of this report was sent to the requesting provider on this date.  Electronically Signed: Burman Riis 12/21/2020, 1:27 PM   I spent a total of    10 Minutes in remote  clinical consultation, greater than 50% of which was counseling/coordinating care for IVC filter managment.    Visit type: Audio only (telephone). Audio (no video) only due to Patient preference.. Alternative for in-person consultation at [facility], [address] [place], [place], [place]. This visit type was conducted due to national recommendations for restrictions regarding the COVID-19 Pandemic (e.g. social distancing).  This format is felt to be most appropriate for this patient at this time.  All issues noted in this document were discussed and addressed.  Patient ID: Matthew Black, male   DOB: 14-Apr-1976, 45 y.o.   MRN: [id number]

## 2020-12-22 ENCOUNTER — Telehealth: Payer: Self-pay | Admitting: Medical

## 2020-12-22 DIAGNOSIS — Z86718 Personal history of other venous thrombosis and embolism: Secondary | ICD-10-CM

## 2020-12-22 NOTE — Telephone Encounter (Signed)
Referral to hematologist placed. °

## 2020-12-22 NOTE — Telephone Encounter (Signed)
Dr. Marin Olp and Judson Roch,  I put in referral for this pt. Pt has history of dvt, PE and stroke. I have seen him on time. He has ivc filter present for about one year. A surgeon wants to remove his ivc filter. Wanted yours opinion and advise on transition to prophylaxis if filter removed.  Thanks,  Mackie Pai, PA-C

## 2020-12-24 ENCOUNTER — Telehealth: Payer: Self-pay | Admitting: Family

## 2020-12-24 NOTE — Telephone Encounter (Signed)
I called and left a detailed message for patient with appointment date/date.  I have also mailed a letter along with a calendar of his appointments .

## 2020-12-25 ENCOUNTER — Telehealth: Payer: Self-pay | Admitting: Medical

## 2020-12-25 ENCOUNTER — Other Ambulatory Visit: Payer: Self-pay | Admitting: Diagnostic Radiology

## 2020-12-25 DIAGNOSIS — I824Y9 Acute embolism and thrombosis of unspecified deep veins of unspecified proximal lower extremity: Secondary | ICD-10-CM

## 2020-12-25 DIAGNOSIS — I639 Cerebral infarction, unspecified: Secondary | ICD-10-CM

## 2020-12-25 NOTE — Telephone Encounter (Signed)
Will you let pt that I am referring him to hematologist. He should have gotten call from hematotogist. Want him to see hematologist before he gets filter removed. Want to get opinion on if needs anticoagulation in future.

## 2020-12-26 ENCOUNTER — Encounter: Payer: Self-pay | Admitting: Occupational Therapy

## 2020-12-26 ENCOUNTER — Ambulatory Visit: Payer: Medicaid Other | Admitting: Occupational Therapy

## 2020-12-26 ENCOUNTER — Other Ambulatory Visit: Payer: Self-pay

## 2020-12-26 ENCOUNTER — Ambulatory Visit: Payer: Medicaid Other | Attending: Physician Assistant

## 2020-12-26 DIAGNOSIS — R293 Abnormal posture: Secondary | ICD-10-CM

## 2020-12-26 DIAGNOSIS — R2681 Unsteadiness on feet: Secondary | ICD-10-CM | POA: Diagnosis not present

## 2020-12-26 DIAGNOSIS — I69354 Hemiplegia and hemiparesis following cerebral infarction affecting left non-dominant side: Secondary | ICD-10-CM | POA: Insufficient documentation

## 2020-12-26 DIAGNOSIS — G8929 Other chronic pain: Secondary | ICD-10-CM

## 2020-12-26 DIAGNOSIS — R41840 Attention and concentration deficit: Secondary | ICD-10-CM | POA: Insufficient documentation

## 2020-12-26 DIAGNOSIS — M25512 Pain in left shoulder: Secondary | ICD-10-CM | POA: Diagnosis not present

## 2020-12-26 DIAGNOSIS — M6281 Muscle weakness (generalized): Secondary | ICD-10-CM | POA: Insufficient documentation

## 2020-12-26 DIAGNOSIS — R208 Other disturbances of skin sensation: Secondary | ICD-10-CM | POA: Diagnosis not present

## 2020-12-26 DIAGNOSIS — R2689 Other abnormalities of gait and mobility: Secondary | ICD-10-CM | POA: Diagnosis not present

## 2020-12-26 DIAGNOSIS — M25612 Stiffness of left shoulder, not elsewhere classified: Secondary | ICD-10-CM | POA: Diagnosis not present

## 2020-12-26 NOTE — Therapy (Signed)
[facility] [address] [place], [place], [address] Phone: [phone]   Fax:  [phone]  Physical Therapy Treatment  Patient Details  Name: Matthew Black MRN: [id number] Date of Birth: 1976-01-17 Referring Provider (PT): Dr. Naaman Plummer but f/u with Dr. Ranell Patrick   Encounter Date: 12/26/2020   PT End of Session - 12/26/20 1532    Visit Number 31    Number of Visits 33    Authorization Type 4 Visits approved 11/18 - 12/22. 1/10-26-15-22 6 visits pending    Authorization - Visit Number 4   pt's initial MCD auth for 3 visits met on 01/19/20, new auth for 12 visits (02/02/20-03/28/20   Authorization - Number of Visits 6    PT Start Time 1530    PT Stop Time 1616    PT Time Calculation (min) 46 min    Equipment Utilized During Treatment Gait belt    Activity Tolerance Patient tolerated treatment well;Patient limited by fatigue    Behavior During Therapy [facility] for tasks assessed/performed           Past Medical History:  Diagnosis Date  . 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 Depression   . DVT (deep venous thrombosis) ([place])    BLE DVT 07/21/19, 08/02/19; s/p retrievable IVC filter 07/21/19  . Hemorrhagic stroke ([place])   . History of kidney stones   . Hypertension   . 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 PE (pulmonary thromboembolism) ([place])    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  . Stroke [facility])    RICA, R A1, R MCA occlusion 07/19/19    Past Surgical History:  Procedure Laterality Date  . CRANIOPLASTY Right 06/05/2020   Procedure: CRANIOPLASTY;  Surgeon: Consuella Lose, MD;  Location: [place];  Service: Neurosurgery;  Laterality: Right;  right  . CRANIOTOMY Right 07/19/2019   Procedure: RIGHT HEMI-CRANIECTOMY With implantation of skull flap to abdominal wall;  Surgeon: Consuella Lose, MD;  Location: [place];  Service: Neurosurgery;  Laterality: Right;  . CYST EXCISION N/A 10/08/2016   Procedure:  EXCISION OF POSTERIOR NECK CYST;  Surgeon: Clovis Riley, MD;  [facility];  Service: General;  Laterality: N/A;  . CYSTOSCOPY/URETEROSCOPY/HOLMIUM LASER/STENT PLACEMENT Right 03/16/2020   Procedure: CYSTOSCOPY RIGHT RETROGRADE PYELOGRAM URETEROSCOPY/HOLMIUM LASER/STENT PLACEMENT;  Surgeon: Lucas Mallow, MD;  [facility];  Service: Urology;  Laterality: Right;  . INCISION AND DRAINAGE ABSCESS N/A 09/22/2014   Procedure: INCISION AND DRAINAGE ABSCESS POSTERIOR NECK;  Surgeon: Pedro Earls, MD;  [facility];  Service: General;  Laterality: N/A;  . INCISION AND DRAINAGE ABSCESS N/A 12/20/2015   Procedure: INCISION AND DRAINAGE POSTERIOR NECK MASS;  Surgeon: Armandina Gemma, MD;  [facility];  Service: General;  Laterality: N/A;  . INCISION AND DRAINAGE ABSCESS Left 07/10/2004   middle finger  . IR IVC FILTER PLMT / S&I /IMG GUID/MOD SED  07/21/2019  . IR RADIOLOGIST EVAL & MGMT  12/14/2019  . IR RADIOLOGIST EVAL & MGMT  12/21/2020  . IR VENOGRAM RENAL UNI RIGHT  07/21/2019  . MASS EXCISION N/A 07/21/2017   Procedure: EXCISION OF BENIGN NECK LESION WITH LAYERED CLOSURE;  Surgeon: Irene Limbo, MD;  Location: [place];  Service: Plastics;  Laterality: N/A;  . MASS EXCISION N/A 11/10/2017   Procedure: EXCISION BENIGN LESION OF THE NECK WITH LAYERED CLOSURE;  Surgeon: Irene Limbo, MD;  Location: [place];  Service: Plastics;  Laterality: N/A;    There were no vitals filed for this visit.   Subjective Assessment - 12/26/20 1533    Subjective Pt reports he is doing ok. Denies any issues.    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 No/denies                             [facility] Adult PT Treatment/Exercise - 12/26/20 1534      Transfers   Transfers Sit to Stand;Stand to Sit    Sit to Stand 5: Supervision;4: Min guard    Sit to Stand Details Verbal cues for technique;Tactile cues for weight shifting    Stand to Sit 5: Supervision;4: Min guard      Ambulation/Gait   Ambulation/Gait Yes    Ambulation/Gait Assistance 4: Min guard    Ambulation/Gait Assistance Details Verbal cues to try to slow right step to increase time on LLE.    Ambulation Distance (Feet) 115 Feet    Assistive device Large base quad cane   left AFO   Gait Pattern Step-to pattern;Decreased step length - right;Decreased stance time - left    Ambulation Surface Level;Indoor      Neuro Re-ed    Neuro Re-ed Details  Sit to stand from mat working on increasing left weight shift x 3. Standing with mirror in front for visual cues: PT providing tactile input at left quad and pelvis rotate left side anterior worked on weight shifting with cues to push left hip out to side x 5 and holding x 10 sec each time. Stepping right foot forward and back with [facility] for support to increase left weight shift. PT cued to shift weight to left leg more and slow down right step. Performed x 5. Attempted sit to stand with 2" step under right foot to further shift weight to left but pt taking all weight off left. In standing tapping 2" step with RLE x 5 to increase left weight shift with cues to slow down min assist at left knee and to help with weight shift.      Exercises   Exercises Other  Exercises    Other Exercises  PT performed seated left hip stretch in to i=IR 30 sec x 2.      Knee/Hip Exercises: Aerobic   Other Aerobic SciFit x 3 min level 2 legs only from w/c with PT stabilizing w/c. LLE externally rotated and may try belt around thighs next time.                    PT Short Term Goals - 09/19/20 1430      PT SHORT TERM GOAL #1   Title STG=LTGs             PT  Long Term Goals - 11/15/20 4315      PT LONG TERM GOAL #1   Title Pt will increase gait speed from 0.49ms to >0.233m for improved gait safety.    Baseline 11/14/20 0.1164m   Time 6    Period Weeks    Status Revised    Target Date 01/17/21      PT LONG TERM GOAL #2   Title Pt will be able to negotiate 4 steps with right rail min assist to be able to perform with family at home for improved access to home.    Baseline max assist with right rail on 11/14/20    Time 6    Period Weeks    Status New    Target Date 01/17/21      PT LONG TERM GOAL #3   Title Pt will ambulate >200' with LRAD CGA for improved mobility.    Baseline 11/14/20 115' with large based quad cane CGA and then min assist at times to try to facilitate more left weight shift.    Time 6    Period Weeks    Status On-going    Target Date 01/17/21      PT LONG TERM GOAL #4   Title Pt will decrease TUG to <1 min for improved turning and improved gait speed.    Baseline 1 min, 18 s best trial with [facility] on 09/19/20. 11/14/20 TUG=65 sec    Time 6    Period Weeks    Status On-going    Target Date 01/17/21                 Plan - 12/26/20 1926    Clinical Impression Statement Pt able to increase gait distance compared to prior sessions with no reports of right foot pain today after working on increasing left weight shift and stance time to step "softer" with right foot. Pt continues to have dificulty shifting weight on left with transfers.    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 08/20/19 w aspiration 08/21/19, (tx empircally w vanco/ cefepime), LLE DVT 07/21/19 and small left lower lung PE 08/01/19 s/p IVC filter 07/21/19, iv heparin discontinued due to worsening ICH 08/14/19, and abdominal wall hematoma, seizure, pseudomonas uti,    Examination-Activity Limitations Bathing;Sit;Bed Mobility;Bend;Squat;Caring for Others;Stairs;Stand;Carry;Toileting;Dressing;Transfers;Hygiene/Grooming;Locomotion Level    Examination-Participation Restrictions Driving;Other;Meal Prep    Rehab Potential Good    PT Frequency 1x / week    PT Duration 6 weeks    PT Treatment/Interventions ADLs/Self Care Home Management;Aquatic Therapy;Biofeedback;Balance training;Therapeutic exercise;Therapeutic activities;Functional mobility training;Wheelchair mobility training;Orthotic Fit/Training;Stair training;Gait training;DME Instruction;Patient/family education;Cognitive remediation;Neuromuscular re-education;Vestibular;Manual techniques;Passive range of motion;Cryotherapy;Moist Heat    PT Next Visit Plan WB through LLE.  Continue with gait training with [facility].  Stair negotiation with R rail and step to sequence as this is big goal for pt.    Consulted and Agree with Plan of Care Patient           Patient will benefit from skilled therapeutic intervention in order to improve the following deficits and impairments:  Abnormal gait,Difficulty walking,Decreased mobility,Decreased balance,Decreased cognition,Decreased range of motion,Decreased coordination,Decreased safety awareness,Impaired flexibility,Postural dysfunction,Decreased knowledge of use of DME,Decreased strength,Impaired UE functional use,Pain,Impaired tone,Impaired  sensation,Decreased endurance  Visit Diagnosis: Other abnormalities of gait and mobility  Muscle weakness (generalized)     Problem List Patient Active Problem List   Diagnosis Date Noted  . S/P craniotomy 06/05/2020  . History of cranioplasty 06/05/2020  . Peri-rectal abscess 02/14/2020  . Abnormal CT scan, pelvis 02/14/2020  . Pancytopenia ([place]) 12/02/2019  . Nephrolithiasis 12/02/2019  . Hydronephrosis with renal and ureteral calculus obstruction 12/02/2019  . Rectal pain 12/02/2019  . Hyperkalemia 12/02/2019  . Reactive depression   . Wound infection after surgery   . Sleep disturbance   . Dysphagia, post-stroke   . Transaminitis   . 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 Cerebral abscess   . Urinary tract infection without hematuria   . Altered mental status   . Primary hypercoagulable state ([place])   . Acute pulmonary embolism without acute cor pulmonale (HCC)   . Deep vein thrombosis (DVT) of non-extremity vein   . Hypokalemia   . Acute blood loss anemia   . Leukocytosis   . Endotracheal tube present   . Acute respiratory failure with hypoxemia ([place])   . Stroke (cerebrum) ([place]) 07/19/2019  . Pressure injury of skin 07/19/2019  . Acute CVA (cerebrovascular accident) ([place])   . Encephalopathy   . Dysphagia   . Acute encephalopathy   . Essential hypertension   . Obesity 03/12/2016  . Scalp abscess 12/20/2015  . Neck abscess 12/20/2015  . Pilonidal cyst 02/08/2013    Electa Sniff, PT, DPT, NCS 12/26/2020, 7:28 PM  [facility] [address] [place], [place], [address] Phone: [phone]   Fax:  [phone]  Name: Matthew Black MRN: [id number] Date of Birth: Jun 01, 1976

## 2020-12-26 NOTE — Therapy (Signed)
[facility] [address] [place], [place], [address] Phone: [phone]   Fax:  [phone]  Occupational Therapy Treatment  Patient Details  Name: Matthew Black MRN: [id number] Date of Birth: 10-29-1976 Referring Provider (OT): Dr Ranell Patrick   Encounter Date: 12/26/2020   OT End of Session - 12/26/20 1802    Visit Number 2    Number of Visits 9    Date for OT Re-Evaluation 03/04/21    Authorization Type [employer] Medicaid - need submission, pending auth    Authorization - Visit Number 2    Authorization - Number of Visits 9    Progress Note Due on Visit 10    OT Start Time 1615    OT Stop Time 1700    OT Time Calculation (min) 45 min    Activity Tolerance Patient tolerated treatment well    Behavior During Therapy [facility] for tasks assessed/performed           Past Medical History:  Diagnosis Date  . 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 Depression   . DVT (deep venous thrombosis) ([place])    BLE DVT 07/21/19, 08/02/19; s/p retrievable IVC filter 07/21/19  . Hemorrhagic stroke ([place])   . History of kidney stones   . Hypertension   . 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 PE (pulmonary thromboembolism) ([place])    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  . Stroke [facility])    RICA, R A1, R MCA occlusion 07/19/19    Past Surgical History:  Procedure Laterality Date  . CRANIOPLASTY Right 06/05/2020   Procedure: CRANIOPLASTY;  Surgeon: Consuella Lose, MD;  Location: [place];  Service: Neurosurgery;  Laterality: Right;  right  . CRANIOTOMY Right 07/19/2019   Procedure: RIGHT HEMI-CRANIECTOMY With implantation of skull flap to abdominal wall;  Surgeon: Consuella Lose, MD;  Location: [place];  Service: Neurosurgery;  Laterality: Right;  . CYST EXCISION N/A 10/08/2016   Procedure: EXCISION OF POSTERIOR NECK CYST;  Surgeon: Clovis Riley, MD;  [facility];  Service: General;  Laterality: N/A;  .  CYSTOSCOPY/URETEROSCOPY/HOLMIUM LASER/STENT PLACEMENT Right 03/16/2020   Procedure: CYSTOSCOPY RIGHT RETROGRADE PYELOGRAM URETEROSCOPY/HOLMIUM LASER/STENT PLACEMENT;  Surgeon: Lucas Mallow, MD;  [facility];  Service: Urology;  Laterality: Right;  . INCISION AND DRAINAGE ABSCESS N/A 09/22/2014   Procedure: INCISION AND DRAINAGE ABSCESS POSTERIOR NECK;  Surgeon: Pedro Earls, MD;  [facility];  Service: General;  Laterality: N/A;  . INCISION AND DRAINAGE ABSCESS N/A 12/20/2015   Procedure: INCISION AND DRAINAGE POSTERIOR NECK MASS;  Surgeon: Armandina Gemma, MD;  [facility];  Service: General;  Laterality: N/A;  . INCISION AND DRAINAGE ABSCESS Left 07/10/2004   middle finger  . IR IVC FILTER PLMT / S&I /IMG GUID/MOD SED  07/21/2019  . IR RADIOLOGIST EVAL & MGMT  12/14/2019  . IR RADIOLOGIST EVAL & MGMT  12/21/2020  . IR VENOGRAM RENAL UNI RIGHT  07/21/2019  . MASS EXCISION N/A 07/21/2017   Procedure: EXCISION OF BENIGN NECK LESION WITH LAYERED CLOSURE;  Surgeon: Irene Limbo, MD;  Location: [place];  Service: Plastics;  Laterality: N/A;  . MASS EXCISION N/A 11/10/2017   Procedure: EXCISION BENIGN LESION OF THE NECK WITH LAYERED CLOSURE;  Surgeon: Irene Limbo, MD;  Location: [place];  Service: Plastics;  Laterality: N/A;    There were no vitals filed for this visit.   Subjective Assessment - 12/26/20 1624  Subjective  Good    Pertinent History CVA s/p hemicaniectomy 07/19/19 with abdominal flap implant.  PMH: hx of seizures and headaches s/p CVA, hx of DVT with IVC filter placement    Limitations fall risk, hx of seizures    Currently in Pain? No/denies    Pain Score 0-No pain                        OT Treatments/Exercises (OP) - 12/26/20 0001      Neurological Re-education Exercises   Other Exercises 1 Neuromuscular reeducation to address postural control as it realted to left shoulder pain.  Patient with  consistent and strong tendency to pull weight toward right side - especially evident in transitional movements - sit to stand, stand to sit, sit to supine, etc.  Patient resists left sided weight shifts, and activation pattern leads to pain in left shoulder.  Worked on supine to partial sidelying onto left, with emphasis of core activation and input into entire left side.  Patient able to accept weight briefly into left forearm with attempts to transition from sidelying to sit.  Patient with strong preferred maladaptive patterns which limit balance and functional mobility, and often cause arm pain.                  OT Education - 12/26/20 1801    Education Details Reviewed OT goals    Person(s) Educated Patient    Methods Explanation    Comprehension Need further instruction            OT Short Term Goals - 12/26/20 1624      OT SHORT TERM GOAL #1   Title Patient will safely complete self range of motion exercises with min cueing    Baseline dependent    Time 4    Period Weeks    Status On-going      OT SHORT TERM GOAL #2   Title Patient will put on pull over shirt with set up assistance    Baseline mod assist    Time 4    Period Weeks    Status On-going      OT SHORT TERM GOAL #3   Title Patient will understand positioning for left shoulder for pain relief in bed, and will be able to direct caregiver    Baseline dependent    Time 4    Period Weeks    Status On-going      OT SHORT TERM GOAL #4   Title Patient will demonstrate at least 15 degrees of shoulder external rotation passively to prevent further joint pain    Baseline 0 external rotation    Time 4    Period Weeks    Status On-going             OT Long Term Goals - 12/26/20 1626      OT LONG TERM GOAL #1   Title Pt/wife will be independent with updated HEP.    Baseline not yet established    Time 8    Period Weeks    Status On-going      OT LONG TERM GOAL #2   Title Patient will don pull over  shirt with modified independence    Baseline mod assist    Time 8    Period Weeks    Status On-going      OT LONG TERM GOAL #3   Title Pt will demo at least 120* L shoulder flex  PROM without pain for incr ease with ADLs    Baseline 95    Time 8    Period Weeks    Status On-going      OT LONG TERM GOAL #4   Title Patient will demonstrate at least 30 degrees of passive external rotation in left shoulder    Baseline 0    Time 8    Period Weeks    Status On-going      OT LONG TERM GOAL #5   Title xxx    Baseline xxx                 Plan - 12/26/20 1802    Clinical Impression Statement Patient is agreeable to OT goals and plan of care.    OT Occupational Profile and History Detailed Assessment- Review of Records and additional review of physical, cognitive, psychosocial history related to current functional performance    Occupational performance deficits (Please refer to evaluation for details): ADL's;IADL's;Work;Rest and Sleep    Body Structure / Function / Physical Skills ADL;Dexterity;Balance;ROM;IADL;Mobility;Sensation;Strength;Tone;FMC;Coordination;Decreased knowledge of precautions;Decreased knowledge of use of DME;GMC;Pain;UE functional use;Flexibility;Improper spinal/pelvic alignment;Body mechanics;Endurance;Proprioception    Cognitive Skills Attention;Emotional;Energy/Drive    Rehab Potential Good    Clinical Decision Making Several treatment options, min-mod task modification necessary    Comorbidities Affecting Occupational Performance: Presence of comorbidities impacting occupational performance    Comorbidities impacting occupational performance description: history of seizures, spasticity    Modification or Assistance to Complete Evaluation  Min-Moderate modification of tasks or assist with assess necessary to complete eval    OT Frequency 1x / week    OT Duration 8 weeks    OT Treatment/Interventions Self-care/ADL training;DME and/or AE  instruction;Fluidtherapy;Moist Heat;Splinting;Balance training;Therapeutic activities;Aquatic Therapy;Ultrasound;Therapeutic exercise;Cognitive remediation/compensation;Passive range of motion;Functional Mobility Training;Neuromuscular education;Cryotherapy;Manual Therapy;Patient/family education;Visual/perceptual remediation/compensation;Energy conservation;Electrical Stimulation    Plan Needs self range of motion strategies.  Dressing - socks, shoes, shirt, sleep positions    OT Home Exercise Plan table slides    Consulted and Agree with Plan of Care Patient           Patient will benefit from skilled therapeutic intervention in order to improve the following deficits and impairments:   Body Structure / Function / Physical Skills: ADL,Dexterity,Balance,ROM,IADL,Mobility,Sensation,Strength,Tone,FMC,Coordination,Decreased knowledge of precautions,Decreased knowledge of use of DME,GMC,Pain,UE functional use,Flexibility,Improper spinal/pelvic alignment,Body mechanics,Endurance,Proprioception Cognitive Skills: Attention,Emotional,Energy/Drive     Visit Diagnosis: 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 S/P craniotomy 06/05/2020  . History of cranioplasty 06/05/2020  . Peri-rectal abscess 02/14/2020  . Abnormal CT scan, pelvis 02/14/2020  . Pancytopenia (HCC) 12/02/2019  . Nephrolithiasis 12/02/2019  . Hydronephrosis with renal and ureteral calculus obstruction 12/02/2019  . Rectal pain 12/02/2019  . Hyperkalemia 12/02/2019  . Reactive depression   . Wound infection after surgery   . Sleep disturbance   . Dysphagia, post-stroke   . Transaminitis   . Right middle  cerebral artery stroke (HCC) 09/06/2019  . Cerebral abscess   . Urinary tract infection without hematuria   . Altered mental status   . Primary hypercoagulable state (HCC)   . Acute pulmonary embolism without acute cor pulmonale (HCC)   . Deep vein thrombosis (DVT) of non-extremity vein   .  Hypokalemia   . Acute blood loss anemia   . Leukocytosis   . Endotracheal tube present   . Acute respiratory failure with hypoxemia ([place])   . Stroke (cerebrum) ([place]) 07/19/2019  . Pressure injury of skin 07/19/2019  . Acute CVA (cerebrovascular accident) ([place])   . Encephalopathy   . Dysphagia   . Acute encephalopathy   . Essential hypertension   . Obesity 03/12/2016  . Scalp abscess 12/20/2015  . Neck abscess 12/20/2015  . Pilonidal cyst 02/08/2013    Mariah Milling, OTR/L 12/26/2020, 6:04 PM  [facility] [address] [place], [place], [address] Phone: [phone]   Fax:  [phone]  Name: Matthew Black MRN: [id number] Date of Birth: 06-17-1976

## 2020-12-26 NOTE — Telephone Encounter (Signed)
Pt.notified

## 2020-12-26 NOTE — Telephone Encounter (Signed)
Called pt andd was unable to leave voicemail

## 2020-12-31 ENCOUNTER — Other Ambulatory Visit: Payer: Self-pay | Admitting: Physical Medicine and Rehabilitation

## 2020-12-31 DIAGNOSIS — I10 Essential (primary) hypertension: Secondary | ICD-10-CM

## 2021-01-01 ENCOUNTER — Ambulatory Visit: Payer: Medicaid Other

## 2021-01-01 ENCOUNTER — Ambulatory Visit: Payer: Medicaid Other | Admitting: Occupational Therapy

## 2021-01-02 ENCOUNTER — Ambulatory Visit: Payer: Medicaid Other

## 2021-01-04 ENCOUNTER — Other Ambulatory Visit: Payer: Medicaid Other

## 2021-01-08 ENCOUNTER — Ambulatory Visit: Payer: Medicaid Other | Admitting: Occupational Therapy

## 2021-01-08 ENCOUNTER — Encounter: Payer: Self-pay | Admitting: Occupational Therapy

## 2021-01-08 ENCOUNTER — Other Ambulatory Visit: Payer: Self-pay

## 2021-01-08 ENCOUNTER — Ambulatory Visit: Payer: Medicaid Other

## 2021-01-08 DIAGNOSIS — R2681 Unsteadiness on feet: Secondary | ICD-10-CM | POA: Diagnosis not present

## 2021-01-08 DIAGNOSIS — R41840 Attention and concentration deficit: Secondary | ICD-10-CM | POA: Diagnosis not present

## 2021-01-08 DIAGNOSIS — M25612 Stiffness of left shoulder, not elsewhere classified: Secondary | ICD-10-CM

## 2021-01-08 DIAGNOSIS — G8929 Other chronic pain: Secondary | ICD-10-CM | POA: Diagnosis not present

## 2021-01-08 DIAGNOSIS — R293 Abnormal posture: Secondary | ICD-10-CM | POA: Diagnosis not present

## 2021-01-08 DIAGNOSIS — M6281 Muscle weakness (generalized): Secondary | ICD-10-CM

## 2021-01-08 DIAGNOSIS — I69354 Hemiplegia and hemiparesis following cerebral infarction affecting left non-dominant side: Secondary | ICD-10-CM | POA: Diagnosis not present

## 2021-01-08 DIAGNOSIS — R208 Other disturbances of skin sensation: Secondary | ICD-10-CM | POA: Diagnosis not present

## 2021-01-08 DIAGNOSIS — R2689 Other abnormalities of gait and mobility: Secondary | ICD-10-CM

## 2021-01-08 DIAGNOSIS — M25512 Pain in left shoulder: Secondary | ICD-10-CM

## 2021-01-08 NOTE — Therapy (Signed)
[facility] [address] [place], [place], [address] Phone: [phone]   Fax:  [phone]  Occupational Therapy Treatment  Patient Details  Name: Matthew Black MRN: [id number] Date of Birth: [birth year] Referring Provider (OT): Dr Ranell Patrick   Encounter Date: 01/08/2021   OT End of Session - 01/08/21 1532    Visit Number 3    Number of Visits 9    Date for OT Re-Evaluation 03/04/21    Authorization Type [employer] Medicaid - need submission, pending auth    Authorization - Visit Number 3    Authorization - Number of Visits 9    Progress Note Due on Visit 10    OT Start Time 1532    OT Stop Time 1615    OT Time Calculation (min) 43 min    Activity Tolerance Patient tolerated treatment well    Behavior During Therapy [facility] for tasks assessed/performed           Past Medical History:  Diagnosis Date  . 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 Depression   . DVT (deep venous thrombosis) ([place])    BLE DVT 07/21/19, 08/02/19; s/p retrievable IVC filter 07/21/19  . Hemorrhagic stroke ([place])   . History of kidney stones   . Hypertension   . Paralysis ([place])    LEFT SIDE  . PE (pulmonary thromboembolism) ([place])    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  . Stroke [facility])    RICA, R A1, R MCA occlusion 07/19/19    Past Surgical History:  Procedure Laterality Date  . CRANIOPLASTY Right 06/05/2020   Procedure: CRANIOPLASTY;  Surgeon: Consuella Lose, MD;  Location: [facility];  Service: Neurosurgery;  Laterality: Right;  right  . CRANIOTOMY Right 07/19/2019   Procedure: RIGHT HEMI-CRANIECTOMY With implantation of skull flap to abdominal wall;  Surgeon: Consuella Lose, MD;  Location: [facility];  Service: Neurosurgery;  Laterality: Right;  . CYST EXCISION N/A 10/08/2016   Procedure: EXCISION OF POSTERIOR NECK CYST;  Surgeon: Clovis Riley, MD;  [facility];  Service: General;  Laterality: N/A;  .  CYSTOSCOPY/URETEROSCOPY/HOLMIUM LASER/STENT PLACEMENT Right 03/16/2020   Procedure: CYSTOSCOPY RIGHT RETROGRADE PYELOGRAM URETEROSCOPY/HOLMIUM LASER/STENT PLACEMENT;  Surgeon: Lucas Mallow, MD;  [facility];  Service: Urology;  Laterality: Right;  . INCISION AND DRAINAGE ABSCESS N/A 09/22/2014   Procedure: INCISION AND DRAINAGE ABSCESS POSTERIOR NECK;  Surgeon: Pedro Earls, MD;  [facility];  Service: General;  Laterality: N/A;  . INCISION AND DRAINAGE ABSCESS N/A 12/20/2015   Procedure: INCISION AND DRAINAGE POSTERIOR NECK MASS;  Surgeon: Armandina Gemma, MD;  [facility];  Service: General;  Laterality: N/A;  . INCISION AND DRAINAGE ABSCESS Left 07/10/2004   middle finger  . IR IVC FILTER PLMT / S&I /IMG GUID/MOD SED  07/21/2019  . IR RADIOLOGIST EVAL & MGMT  12/14/2019  . IR RADIOLOGIST EVAL & MGMT  12/21/2020  . IR VENOGRAM RENAL UNI RIGHT  07/21/2019  . MASS EXCISION N/A 07/21/2017   Procedure: EXCISION OF BENIGN NECK LESION WITH LAYERED CLOSURE;  Surgeon: Irene Limbo, MD;  Location: [facility];  Service: Plastics;  Laterality: N/A;  . MASS EXCISION N/A 11/10/2017   Procedure: EXCISION BENIGN LESION OF THE NECK WITH LAYERED CLOSURE;  Surgeon: Irene Limbo, MD;  Location: [facility];  Service: Plastics;  Laterality: N/A;    There were no vitals filed for this visit.   Subjective Assessment - 01/08/21 1533  Subjective  Pt denies any pain.    Pertinent History CVA s/p hemicaniectomy 07/19/19 with abdominal flap implant.  PMH: hx of seizures and headaches s/p CVA, hx of DVT with IVC filter placement    Limitations fall risk, hx of seizures    Currently in Pain? No/denies                        OT Treatments/Exercises (OP) - 01/08/21 1616      Bed Mobility   Bed Mobility Sit to Supine   to left   Sit to Supine Moderate Assistance - Patient 50-74%      Neurological Re-education Exercises   Other Exercises 1  self-PROM in sitting with lowering to floor x 10, self PROM in supine flex, chest press, horiz abd x 10      Manual Therapy   Manual Therapy Passive ROM    Passive ROM to LUE pecs, bicep, wrist and hand. Pt with minimal report of pain except at approx 80* shoulder flexion                    OT Short Term Goals - 12/26/20 1624      OT SHORT TERM GOAL #1   Title Patient will safely complete self range of motion exercises with min cueing    Baseline dependent    Time 4    Period Weeks    Status On-going      OT SHORT TERM GOAL #2   Title Patient will put on pull over shirt with set up assistance    Baseline mod assist    Time 4    Period Weeks    Status On-going      OT SHORT TERM GOAL #3   Title Patient will understand positioning for left shoulder for pain relief in bed, and will be able to direct caregiver    Baseline dependent    Time 4    Period Weeks    Status On-going      OT SHORT TERM GOAL #4   Title Patient will demonstrate at least 15 degrees of shoulder external rotation passively to prevent further joint pain    Baseline 0 external rotation    Time 4    Period Weeks    Status On-going             OT Long Term Goals - 12/26/20 1626      OT LONG TERM GOAL #1   Title Pt/wife will be independent with updated HEP.    Baseline not yet established    Time 8    Period Weeks    Status On-going      OT LONG TERM GOAL #2   Title Patient will don pull over shirt with modified independence    Baseline mod assist    Time 8    Period Weeks    Status On-going      OT LONG TERM GOAL #3   Title Pt will demo at least 120* L shoulder flex PROM without pain for incr ease with ADLs    Baseline 95    Time 8    Period Weeks    Status On-going      OT LONG TERM GOAL #4   Title Patient will demonstrate at least 30 degrees of passive external rotation in left shoulder    Baseline 0    Time 8    Period Weeks    Status On-going  OT LONG TERM GOAL #5    Title xxx    Baseline xxx                 Plan - 01/08/21 1624    Clinical Impression Statement Pt with decreased pain in LUE and tolerated all exercises this day with exception of sidelying on left.    OT Occupational Profile and History Detailed Assessment- Review of Records and additional review of physical, cognitive, psychosocial history related to current functional performance    Occupational performance deficits (Please refer to evaluation for details): ADL's;IADL's;Work;Rest and Sleep    Body Structure / Function / Physical Skills ADL;Dexterity;Balance;ROM;IADL;Mobility;Sensation;Strength;Tone;FMC;Coordination;Decreased knowledge of precautions;Decreased knowledge of use of DME;GMC;Pain;UE functional use;Flexibility;Improper spinal/pelvic alignment;Body mechanics;Endurance;Proprioception    Cognitive Skills Attention;Emotional;Energy/Drive    Rehab Potential Good    Clinical Decision Making Several treatment options, min-mod task modification necessary    Comorbidities Affecting Occupational Performance: Presence of comorbidities impacting occupational performance    Comorbidities impacting occupational performance description: history of seizures, spasticity    Modification or Assistance to Complete Evaluation  Min-Moderate modification of tasks or assist with assess necessary to complete eval    OT Frequency 1x / week    OT Duration 8 weeks    OT Treatment/Interventions Self-care/ADL training;DME and/or AE instruction;Fluidtherapy;Moist Heat;Splinting;Balance training;Therapeutic activities;Aquatic Therapy;Ultrasound;Therapeutic exercise;Cognitive remediation/compensation;Passive range of motion;Functional Mobility Training;Neuromuscular education;Cryotherapy;Manual Therapy;Patient/family education;Visual/perceptual remediation/compensation;Energy conservation;Electrical Stimulation    Plan Dressing - socks, shoes, shirt, sleep positions    OT Home Exercise Plan table slides     Consulted and Agree with Plan of Care Patient           Patient will benefit from skilled therapeutic intervention in order to improve the following deficits and impairments:   Body Structure / Function / Physical Skills: ADL,Dexterity,Balance,ROM,IADL,Mobility,Sensation,Strength,Tone,FMC,Coordination,Decreased knowledge of precautions,Decreased knowledge of use of DME,GMC,Pain,UE functional use,Flexibility,Improper spinal/pelvic alignment,Body mechanics,Endurance,Proprioception Cognitive Skills: Attention,Emotional,Energy/Drive     Visit Diagnosis: 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)  Chronic left shoulder pain  Stiffness of left shoulder, not elsewhere classified  Attention and concentration deficit  Other disturbances of skin sensation  Unsteadiness on feet  Muscle weakness (generalized)    Problem List Patient Active Problem List   Diagnosis Date Noted  . S/P craniotomy 06/05/2020  . History of cranioplasty 06/05/2020  . Peri-rectal abscess 02/14/2020  . Abnormal CT scan, pelvis 02/14/2020  . Pancytopenia ([place]) 12/02/2019  . Nephrolithiasis 12/02/2019  . Hydronephrosis with renal and ureteral calculus obstruction 12/02/2019  . Rectal pain 12/02/2019  . Hyperkalemia 12/02/2019  . Reactive depression   . Wound infection after surgery   . Sleep disturbance   . Dysphagia, post-stroke   . Transaminitis   . 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 Cerebral abscess   . Urinary tract infection without hematuria   . Altered mental status   . Primary hypercoagulable state ([place])   . Acute pulmonary embolism without acute cor pulmonale (HCC)   . Deep vein thrombosis (DVT) of non-extremity vein   . Hypokalemia   . Acute blood loss anemia   . Leukocytosis   . Endotracheal tube present   . Acute respiratory failure with hypoxemia ([place])   . Stroke (cerebrum) ([place]) 07/19/2019  . Pressure injury of skin  07/19/2019  . Acute CVA (cerebrovascular accident) ([place])   . Encephalopathy   . Dysphagia   . Acute encephalopathy   . Essential hypertension   . Obesity 03/12/2016  . Scalp abscess 12/20/2015  .  Neck abscess 12/20/2015  . Pilonidal cyst 02/08/2013    Zachery Conch MOT, OTR/L  01/08/2021, 4:30 PM  [facility] [address] [place], [place], [address] Phone: [phone]   Fax:  [phone]  Name: Matthew Black MRN: [id number] Date of Birth: 08-13-1976

## 2021-01-09 ENCOUNTER — Ambulatory Visit: Payer: Medicaid Other

## 2021-01-09 DIAGNOSIS — M25612 Stiffness of left shoulder, not elsewhere classified: Secondary | ICD-10-CM | POA: Diagnosis not present

## 2021-01-09 DIAGNOSIS — M25512 Pain in left shoulder: Secondary | ICD-10-CM | POA: Diagnosis not present

## 2021-01-09 DIAGNOSIS — I69354 Hemiplegia and hemiparesis following cerebral infarction affecting left non-dominant side: Secondary | ICD-10-CM | POA: Diagnosis not present

## 2021-01-09 DIAGNOSIS — R2681 Unsteadiness on feet: Secondary | ICD-10-CM | POA: Diagnosis not present

## 2021-01-09 DIAGNOSIS — G8929 Other chronic pain: Secondary | ICD-10-CM | POA: Diagnosis not present

## 2021-01-09 DIAGNOSIS — R208 Other disturbances of skin sensation: Secondary | ICD-10-CM | POA: Diagnosis not present

## 2021-01-09 DIAGNOSIS — R2689 Other abnormalities of gait and mobility: Secondary | ICD-10-CM

## 2021-01-09 DIAGNOSIS — R293 Abnormal posture: Secondary | ICD-10-CM | POA: Diagnosis not present

## 2021-01-09 DIAGNOSIS — M6281 Muscle weakness (generalized): Secondary | ICD-10-CM

## 2021-01-09 DIAGNOSIS — R41840 Attention and concentration deficit: Secondary | ICD-10-CM | POA: Diagnosis not present

## 2021-01-09 NOTE — Therapy (Signed)
[facility] [address] , [place], [address] Phone: [phone]   Fax:  [phone]  Physical Therapy Treatment  Patient Details  Name: Matthew Black MRN: [id number] Date of Birth: 06/17/1976 Referring Provider (PT): Dr. Swartz but f/u with Dr. Raulkar   Encounter Date: 01/08/2021   PT End of Session - 01/08/21 1614    Visit Number 32    Number of Visits 33    Authorization Type 4 Visits approved 11/18 - 12/22. 1/10-26-15-22 6 visits pending    Authorization - Visit Number 5   pt's initial MCD auth for 3 visits met on 01/19/20, new auth for 12 visits (02/02/20-03/28/20   Authorization - Number of Visits 6    PT Start Time 1614    PT Stop Time 1700    PT Time Calculation (min) 46 min    Equipment Utilized During Treatment Gait belt    Activity Tolerance Patient tolerated treatment well;Patient limited by fatigue    Behavior During Therapy [facility] for tasks assessed/performed           Past Medical History:  Diagnosis Date  . 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 Depression   . DVT (deep venous thrombosis) ([facility])    BLE DVT 07/21/19, 08/02/19; s/p retrievable IVC filter 07/21/19  . Hemorrhagic stroke ([facility])   . History of kidney stones   . Hypertension   . Paralysis ([facility])    LEFT SIDE  . PE (pulmonary thromboembolism) ([facility])    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  . Stroke ([facility])    RICA, R A1, R MCA occlusion 07/19/19    Past Surgical History:  Procedure Laterality Date  . CRANIOPLASTY Right 06/05/2020   Procedure: CRANIOPLASTY;  Surgeon: Nundkumar, Neelesh, MD;  [facility];  Service: Neurosurgery;  Laterality: Right;  right  . CRANIOTOMY Right 07/19/2019   Procedure: RIGHT HEMI-CRANIECTOMY With implantation of skull flap to abdominal wall;  Surgeon: Nundkumar, Neelesh, MD;  [facility];  Service: Neurosurgery;  Laterality: Right;  . CYST EXCISION N/A 10/08/2016   Procedure:  EXCISION OF POSTERIOR NECK CYST;  Surgeon: Chelsea A Connor, MD;  [facility];  Service: General;  Laterality: N/A;  . CYSTOSCOPY/URETEROSCOPY/HOLMIUM LASER/STENT PLACEMENT Right 03/16/2020   Procedure: CYSTOSCOPY RIGHT RETROGRADE PYELOGRAM URETEROSCOPY/HOLMIUM LASER/STENT PLACEMENT;  Surgeon: Bell, Eugene D III, MD;  [facility];  Service: Urology;  Laterality: Right;  . INCISION AND DRAINAGE ABSCESS N/A 09/22/2014   Procedure: INCISION AND DRAINAGE ABSCESS POSTERIOR NECK;  Surgeon: Matthew B Martin, MD;  [facility];  Service: General;  Laterality: N/A;  . INCISION AND DRAINAGE ABSCESS N/A 12/20/2015   Procedure: INCISION AND DRAINAGE POSTERIOR NECK MASS;  Surgeon: Todd Gerkin, MD;  [facility];  Service: General;  Laterality: N/A;  . INCISION AND DRAINAGE ABSCESS Left 07/10/2004   middle finger  . IR IVC FILTER PLMT / S&I /IMG GUID/MOD SED  07/21/2019  . IR RADIOLOGIST EVAL & MGMT  12/14/2019  . IR RADIOLOGIST EVAL & MGMT  12/21/2020  . IR VENOGRAM RENAL UNI RIGHT  07/21/2019  . MASS EXCISION N/A 07/21/2017   Procedure: EXCISION OF BENIGN NECK LESION WITH LAYERED CLOSURE;  Surgeon: Thimmappa, Brinda, MD;  Location: [facility];  Service: Plastics;  Laterality: N/A;  . MASS EXCISION N/A 11/10/2017   Procedure: EXCISION BENIGN LESION OF THE NECK WITH LAYERED CLOSURE;  Surgeon: Thimmappa, Brinda, MD;  Location: [facility];  Service: Plastics;    Laterality: N/A;    There were no vitals filed for this visit.   Subjective Assessment - 01/08/21 1614    Subjective Pt reports that his left foot was really bothering him for almost a week after last session. Stayed off it for a couple days.    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 No/denies                             [facility] Adult PT Treatment/Exercise - 01/08/21 1617      Bed Mobility   Left Sidelying to Sit Moderate Assistance - Patient 50-74%      Transfers   Transfers Sit to Stand;Stand to Sit    Sit to Stand 5: Supervision    Stand to Sit 5: Supervision    Comments Pt always tends to turn to the left. PT advised not to pivot around on right leg but instead turn all the way prior to sitting. Had pt practice transfer turning to right as well with w/c to/from mat supervision/CGA.      Ambulation/Gait   Ambulation/Gait Yes    Ambulation/Gait Assistance 4: Min guard    Ambulation/Gait Assistance Details Pt was cued to stay up tall and try to increase left weight shift    Ambulation Distance (Feet) 75 Feet    Assistive device Large base quad cane   left AFO   Gait velocity 61 sec=0.16m/s    Stairs Yes    Stairs Assistance 2: Max assist;3: Mod assist    Stairs Assistance Details (indicate cue type and reason) First bout of 4 steps pt was max assist of +2 as he got nervous half way up. Pt was struggling to shift weight on left leg and step up with right. Repeated 4 more steps at end of session and did much better after cuing to stand tall and not lean over railing.    Stair Management Technique Step to pattern;One rail Right    Number of Stairs 8    Height of Stairs 6      Standardized Balance Assessment   Standardized Balance Assessment Timed Up and Go Test      Timed Up and Go Test   TUG  Normal TUG    Normal TUG (seconds) 67   68 sec and 66 sec                 PT Education - 01/08/21 1825    Education Details Discussed d/c plan after tomorrow's visit.    Person(s) Educated Patient    Methods Explanation    Comprehension Verbalized understanding            PT Short Term Goals - 09/19/20 1430      PT SHORT TERM GOAL #1     Title STG=LTGs             PT Long Term Goals - 01/08/21 1656      PT LONG TERM GOAL #1   Title Pt will increase gait speed from 0.48ms to >0.217m for improved gait safety.    Baseline 11/14/20 0.1165m 01/08/21 0.15m11m  Time 6    Period Weeks    Status Not Met      PT LONG TERM GOAL #2   Title Pt will be able to negotiate 4 steps with right rail min assist to be able to perform with family at home for improved access to home.    Baseline max assist with right rail on 11/14/20    Time 6    Period Weeks    Status New      PT LONG TERM GOAL #3   Title Pt will ambulate >200' with LRAD CGA for improved mobility.    Baseline 11/14/20 115' with large based quad cane CGA and then min assist at times to try to facilitate more left weight shift.    Time 6    Period Weeks    Status On-going      PT LONG TERM GOAL #4   Title Pt will decrease TUG to <1 min for improved turning and improved gait speed.    Baseline 1 min, 18 s best trial with LBQC on 09/19/20. 11/14/20 TUG=65 sec, 01/08/21 67 sec    Time 6    Period Weeks    Status Not Met                 Plan - 01/08/21 1824    Clinical Impression Statement PT started checking goals today. Pt did not have any significant change on TUG or gait speed since last assessment. Pt was more fearful with initial stair trial but improved at end of session with 2nd attempt.    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 08/20/19 w aspiration 08/21/19, (tx empircally w vanco/ cefepime), LLE DVT 07/21/19 and small left lower lung PE 08/01/19 s/p IVC filter 07/21/19, iv heparin discontinued due to worsening ICH 08/14/19, and abdominal wall hematoma, seizure, pseudomonas uti,    Examination-Activity Limitations Bathing;Sit;Bed Mobility;Bend;Squat;Caring for Others;Stairs;Stand;Carry;Toileting;Dressing;Transfers;Hygiene/Grooming;Locomotion Level    Examination-Participation Restrictions Driving;Other;Meal Prep    Rehab Potential Good    PT Frequency 1x / week    PT Duration 6 weeks    PT Treatment/Interventions ADLs/Self Care Home Management;Aquatic Therapy;Biofeedback;Balance training;Therapeutic exercise;Therapeutic activities;Functional mobility training;Wheelchair mobility training;Orthotic Fit/Training;Stair training;Gait training;DME Instruction;Patient/family education;Cognitive remediation;Neuromuscular re-education;Vestibular;Manual techniques;Passive range of motion;Cryotherapy;Moist Heat    PT Next Visit Plan Try stairs again. Gait with LBQC. Review HEP. Discharge planned next visit. Will need to send new recert to change frequency to 2x/week for this week making up missed visit last week as end of medicaid auth.    Consulted and Agree with Plan of Care Patient           Patient will benefit from skilled therapeutic intervention in order to improve the following deficits and impairments:  Abnormal gait,Difficulty walking,Decreased mobility,Decreased balance,Decreased cognition,Decreased range of motion,Decreased coordination,Decreased safety awareness,Impaired flexibility,Postural dysfunction,Decreased knowledge of use of DME,Decreased strength,Impaired UE functional use,Pain,Impaired tone,Impaired sensation,Decreased endurance  Visit Diagnosis: Other abnormalities of gait and  mobility  Muscle weakness (generalized)     Problem List Patient Active Problem List   Diagnosis Date Noted  . S/P craniotomy 06/05/2020  . History of cranioplasty 06/05/2020  . Peri-rectal abscess 02/14/2020  . Abnormal CT scan, pelvis 02/14/2020  . Pancytopenia (HCC) 12/02/2019  . Nephrolithiasis 12/02/2019  . Hydronephrosis with renal and ureteral calculus obstruction 12/02/2019  . Rectal pain 12/02/2019  . Hyperkalemia 12/02/2019  . Reactive depression   . Wound infection after surgery   . Sleep disturbance   . Dysphagia, post-stroke   . Transaminitis   . 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  . Cerebral abscess   . Urinary tract infection without hematuria   . Altered mental status   . Primary hypercoagulable state (HCC)   . Acute pulmonary embolism without acute cor pulmonale (HCC)   . Deep vein thrombosis (DVT) of non-extremity vein   . Hypokalemia   . Acute blood loss anemia   . Leukocytosis   . Endotracheal tube present   . Acute respiratory failure with hypoxemia (HCC)   . Stroke (cerebrum) (HCC) 07/19/2019  . Pressure injury of skin 07/19/2019  . Acute CVA (cerebrovascular accident) ([facility])   . Encephalopathy   . Dysphagia   . Acute encephalopathy   . Essential hypertension   . Obesity 03/12/2016  . Scalp abscess 12/20/2015  . Neck abscess 12/20/2015  . Pilonidal cyst 02/08/2013    Emily A Parker, PT, DPT, NCS 01/09/2021, 8:03 AM  [facility] [address] [place], [place], [address] Phone: [phone]   Fax:  [phone]  Name: Matthew Black MRN: [id number] Date of Birth: 03/23/1976   

## 2021-01-09 NOTE — Patient Instructions (Signed)
Access Code: [email or web address] URL: [email or web address] Date: 01/09/2021 Prepared by: Cherly Anderson  Exercises Supine Bridge - 1 x daily - 6 x weekly - 5 reps - 3 sets - 2 hold Bent Knee Fallouts - 1 x daily - 6 x weekly - 5 reps - 3 sets Supine Heel Slide with Strap - 1 x daily - 6 x weekly - 5 reps - 2 sets Supine Knee Extension Strengthening - 1 x daily - 6 x weekly - 5 reps - 3 sets Supine Bilateral Hip Internal Rotation Stretch - 3 x daily - 7 x weekly - 1 sets - 3 reps - 30 sec hold Supine Hip Flexor Stretch with Caregiver - 3 x daily - 7 x weekly - 1 sets - 3 reps - 30 sec hold Side to Side Weight Shift with Counter Support - 1 x daily - 7 x weekly - 3 sets - 10 reps

## 2021-01-10 NOTE — Therapy (Addendum)
[facility] [address] [place], [place], [address] Phone: [phone]   Fax:  [phone]  Physical Therapy Treatment/Recert for change in frequency/Discharge summary  Patient Details  Name: Matthew Black MRN: [id number] Date of Birth: July 30, 1976 Referring Provider (PT): Dr. Naaman Plummer but f/u with Dr. Ranell Patrick  PHYSICAL THERAPY DISCHARGE SUMMARY  Visits from Start of Care: 39  Current functional level related to goals / functional outcomes: See clinical impression for more information as well as goals. Pt is able to ambulate max of around 115' with [facility] close SBA/CGA. Supervision with transfers with heavy RLE use.   Remaining deficits: Left hemiparesis, fall risk   Education / Equipment: HEP  Plan: Patient agrees to discharge.  Patient goals were not met. Patient is being discharged due to lack of progress.  ?????       Encounter Date: 01/09/2021   PT End of Session - 01/09/21 1535    Visit Number 33    Number of Visits 33    Authorization Type 4 Visits approved 11/18 - 12/22. 1/10-26-15-22 6 visits pending    Authorization - Visit Number 6   pt's initial MCD auth for 3 visits met on 01/19/20, new auth for 12 visits (02/02/20-03/28/20   Authorization - Number of Visits 6    PT Start Time 1533    PT Stop Time 1613    PT Time Calculation (min) 40 min    Equipment Utilized During Treatment Gait belt    Activity Tolerance Patient tolerated treatment well;Patient limited by fatigue    Behavior During Therapy WFL for tasks assessed/performed           Past Medical History:  Diagnosis Date  . 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 Depression   . DVT (deep venous thrombosis) ([place])    BLE DVT 07/21/19, 08/02/19; s/p retrievable IVC filter 07/21/19  . Hemorrhagic stroke ([place])   . History of kidney stones   . Hypertension   . Paralysis ([place])    LEFT SIDE  . PE (pulmonary thromboembolism) ([place])    08/01/19 non-occlusieve left  posterior lower lobe segmental artery PE  . Stroke [facility])    RICA, R A1, R MCA occlusion 07/19/19    Past Surgical History:  Procedure Laterality Date  . CRANIOPLASTY Right 06/05/2020   Procedure: CRANIOPLASTY;  Surgeon: Consuella Lose, MD;  Location: [place];  Service: Neurosurgery;  Laterality: Right;  right  . CRANIOTOMY Right 07/19/2019   Procedure: RIGHT HEMI-CRANIECTOMY With implantation of skull flap to abdominal wall;  Surgeon: Consuella Lose, MD;  Location: [place];  Service: Neurosurgery;  Laterality: Right;  . CYST EXCISION N/A 10/08/2016   Procedure: EXCISION OF POSTERIOR NECK CYST;  Surgeon: Clovis Riley, MD;  [facility];  Service: General;  Laterality: N/A;  . CYSTOSCOPY/URETEROSCOPY/HOLMIUM LASER/STENT PLACEMENT Right 03/16/2020   Procedure: CYSTOSCOPY RIGHT RETROGRADE PYELOGRAM URETEROSCOPY/HOLMIUM LASER/STENT PLACEMENT;  Surgeon: Lucas Mallow, MD;  [facility];  Service: Urology;  Laterality: Right;  . INCISION AND DRAINAGE ABSCESS N/A 09/22/2014   Procedure: INCISION AND DRAINAGE ABSCESS POSTERIOR NECK;  Surgeon: Pedro Earls, MD;  [facility];  Service: General;  Laterality: N/A;  . INCISION AND DRAINAGE ABSCESS N/A 12/20/2015   Procedure: INCISION AND DRAINAGE POSTERIOR NECK MASS;  Surgeon: Armandina Gemma, MD;  [facility];  Service: General;  Laterality: N/A;  . INCISION AND DRAINAGE ABSCESS Left 07/10/2004   middle finger  . IR IVC FILTER PLMT / S&I Burke Keels  GUID/MOD SED  07/21/2019  . IR RADIOLOGIST EVAL & MGMT  12/14/2019  . IR RADIOLOGIST EVAL & MGMT  12/21/2020  . IR VENOGRAM RENAL UNI RIGHT  07/21/2019  . MASS EXCISION N/A 07/21/2017   Procedure: EXCISION OF BENIGN NECK LESION WITH LAYERED CLOSURE;  Surgeon: Irene Limbo, MD;  Location: [place];  Service: Plastics;  Laterality: N/A;  . MASS EXCISION N/A 11/10/2017   Procedure: EXCISION BENIGN LESION OF THE NECK WITH LAYERED CLOSURE;  Surgeon: Irene Limbo, MD;   Location: [place];  Service: Plastics;  Laterality: N/A;    There were no vitals filed for this visit.   Subjective Assessment - 01/09/21 1535    Subjective Pt reports that feet have been doing good since yesterday.    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 No/denies                             [facility] Adult PT Treatment/Exercise - 01/09/21 1536      Bed Mobility   Bed Mobility Sit to Supine;Supine to Sit    Supine to Sit Contact Guard/Touching assist    Sit to Supine Supervision/Verbal cueing   pt used RLE to lift LLE on mat     Transfers   Transfers Sit to Stand;Stand to Sit;Stand Pivot Transfers    Sit to Stand 5: Supervision    Stand to Sit 5: Supervision    Stand Pivot Transfers 5: Supervision;4: Min guard    Stand Pivot Transfer Details (indicate cue type and reason) verbal cues to turn to right when transferring to the left instead of turning all the way around. Instructed to try to take small steps putting some weight on LLE during transfer with use of quad cane.      Ambulation/Gait   Ambulation/Gait Yes    Ambulation/Gait Assistance 5: Supervision;4: Min guard    Ambulation/Gait Assistance Details Pt was cued to tighten left quad during left stance to try to  increase left stance time.    Ambulation Distance (Feet) 50 Feet    Assistive device Large base quad cane   left AFO   Stairs Yes    Stairs Assistance 3: Mod assist    Stairs Assistance Details (indicate cue type and reason) rehab tech standing by for safety. Pt was cued to stay up tall and tighten left quad when weight shifts over. PT also providing tactile assist to stabilize at left knee in both directions with helping with placement with descent.    Stair Management Technique One rail Right;Step to pattern    Number of Stairs 4    Height of Stairs 6    Gait Comments Pt performed weight shifting to left x 10 at bottom of steps with tactile  cues to tighten left quad.      Exercises   Exercises Other Exercises    Other Exercises  PT reviewed HEP from [facility] as noted below.           Exercises Supine Bridge - 1 x daily - 6 x weekly - 5 reps - 3 sets - 2 hold  -performed x 10 with PT stabilizing LLE Bent Knee Fallouts - 1 x daily - 6 x weekly - 5 reps - 3 sets  -performed x 10 min assist Supine Heel Slide with Strap - 1 x daily - 6 x weekly - 5 reps - 2 sets  -performed x 5 with PT assist Supine Knee Extension Strengthening - 1 x daily - 6 x weekly - 5 reps - 3 sets  -performed over bolster x 10 Supine Bilateral Hip Internal Rotation Stretch - 3 x daily - 7 x weekly - 1 sets - 3 reps - 30 sec hold  -performed 30 sec x 3 Supine Hip Flexor Stretch with Caregiver - 3 x daily - 7 x weekly - 1 sets - 3 reps - 30 sec hold  -performed 30 sec x 2 Side to Side Weight Shift with Counter Support - 1 x daily - 7 x weekly - 3 sets - 10 reps        PT Education - 01/10/21 0831    Education Details Pt was educated on discharge today as planned. Reviewed HEP and instructed to continue to work on as well as walking at home with family. Added weight shifting at sink to HEP    Person(s) Educated Patient    Methods Explanation;Demonstration;Handout    Comprehension Verbalized  understanding;Returned demonstration            PT Short Term Goals - 09/19/20 1430      PT SHORT TERM GOAL #1   Title STG=LTGs             PT Long Term Goals - 01/10/21 4431      PT LONG TERM GOAL #1   Title Pt will increase gait speed from 0.42ms to >0.217m for improved gait safety.    Baseline 11/14/20 0.1159m 01/08/21 0.17m34m  Time 6    Period Weeks    Status Not Met      PT LONG TERM GOAL #2   Title Pt will be able to negotiate 4 steps with right rail min assist to be able to perform with family at home for improved access to home.    Baseline max assist with right rail on 11/14/20. 01/09/21 mod/max assist    Time 6    Period Weeks    Status Not Met      PT LONG TERM GOAL #3   Title Pt will ambulate >200' with LRAD CGA for improved mobility.    Baseline 11/14/20 115' with large based quad cane CGA and then min assist at times to try to facilitate more left weight shift.01/09/21 Pt's longest distance has been 115' since last assessment CGA/supervision with [facility].    Time 6    Period Weeks    Status Not Met      PT LONG TERM GOAL #4   Title Pt will decrease TUG to <1 min for improved turning and improved gait speed.    Baseline 1 min, 18 s best trial with [facility] on 09/19/20. 11/14/20 TUG=65 sec, 01/08/21 67 sec    Time 6    Period Weeks    Status  Not Met                 Plan - 01/10/21 6599    Clinical Impression Statement PT reassessed final goal of stair negotiation again today. Pt's assistance level varies some on steps but was able to perform with mod/max assist today with right rail. 2nd person stood by for safety. Pt continues to have LLE weakness with decreased weight shift to left. He relies heavily on RLE. PT reviewed HEP for stretching and strengthening that pt needs assist of family to complete. He reports they have been performing. No significant change in gait speed or TUG with patient still high fall risk. PT encouraged to continue to walk at  home but only with family assist. PT discharging today.    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 08/20/19 w aspiration 08/21/19, (tx empircally w vanco/ cefepime), LLE DVT 07/21/19 and small left lower lung PE 08/01/19 s/p IVC filter 07/21/19, iv heparin discontinued due to worsening ICH 08/14/19, and abdominal wall hematoma, seizure, pseudomonas uti,    Examination-Activity Limitations Bathing;Sit;Bed Mobility;Bend;Squat;Caring for Others;Stairs;Stand;Carry;Toileting;Dressing;Transfers;Hygiene/Grooming;Locomotion Level    Examination-Participation Restrictions Driving;Other;Meal Prep    Rehab Potential Good    PT Frequency 1x / week   increased frequency to 2x the last week   PT Duration 6 weeks    PT Treatment/Interventions ADLs/Self Care Home Management;Aquatic Therapy;Biofeedback;Balance training;Therapeutic exercise;Therapeutic activities;Functional mobility training;Wheelchair mobility training;Orthotic Fit/Training;Stair training;Gait training;DME Instruction;Patient/family education;Cognitive remediation;Neuromuscular re-education;Vestibular;Manual techniques;Passive range of motion;Cryotherapy;Moist Heat    PT Next Visit Plan PT discharged today.    Consulted and Agree with Plan of Care Patient           Patient will benefit from skilled therapeutic intervention in order to improve the following deficits and impairments:  Abnormal gait,Difficulty walking,Decreased mobility,Decreased balance,Decreased cognition,Decreased range of motion,Decreased coordination,Decreased safety awareness,Impaired flexibility,Postural dysfunction,Decreased knowledge of use of DME,Decreased strength,Impaired UE functional  use,Pain,Impaired tone,Impaired sensation,Decreased endurance  Visit Diagnosis: Other abnormalities of gait and mobility  Muscle weakness (generalized)     Problem List Patient Active Problem List   Diagnosis Date Noted  . S/P craniotomy 06/05/2020  . History of cranioplasty 06/05/2020  . Peri-rectal abscess 02/14/2020  . Abnormal CT scan, pelvis 02/14/2020  . Pancytopenia ([place]) 12/02/2019  . Nephrolithiasis 12/02/2019  . Hydronephrosis with renal and ureteral calculus obstruction 12/02/2019  . Rectal pain 12/02/2019  . Hyperkalemia 12/02/2019  . Reactive depression   . Wound infection after surgery   . Sleep disturbance   . Dysphagia, post-stroke   . Transaminitis   . 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 Cerebral abscess   . Urinary tract infection without hematuria   . Altered mental status   . Primary hypercoagulable state ([place])   . Acute pulmonary embolism without acute cor pulmonale (HCC)   . Deep vein thrombosis (DVT) of non-extremity vein   . Hypokalemia   . Acute blood loss anemia   . Leukocytosis   . Endotracheal tube present   . Acute respiratory failure with hypoxemia ([place])   . Stroke (cerebrum) ([place]) 07/19/2019  . Pressure injury of skin 07/19/2019  . Acute CVA (cerebrovascular accident) ([place])   . Encephalopathy   . Dysphagia   . Acute encephalopathy   . Essential hypertension   .  Obesity 03/12/2016  . Scalp abscess 12/20/2015  . Neck abscess 12/20/2015  . Pilonidal cyst 02/08/2013    Electa Sniff, PT, DPT, NCS 01/10/2021, 11:05 AM  [facility] [address] [place], [place], [address] Phone: [phone]   Fax:  [phone]  Name: Matthew Black MRN: [id number] Date of Birth: 08-16-1976

## 2021-01-14 ENCOUNTER — Other Ambulatory Visit: Payer: Self-pay | Admitting: Family

## 2021-01-14 DIAGNOSIS — I2699 Other pulmonary embolism without acute cor pulmonale: Secondary | ICD-10-CM

## 2021-01-14 DIAGNOSIS — I63411 Cerebral infarction due to embolism of right middle cerebral artery: Secondary | ICD-10-CM

## 2021-01-14 DIAGNOSIS — I8291 Chronic embolism and thrombosis of unspecified vein: Secondary | ICD-10-CM

## 2021-01-15 ENCOUNTER — Encounter: Payer: Self-pay | Admitting: Family

## 2021-01-15 ENCOUNTER — Inpatient Hospital Stay: Payer: Medicaid Other | Attending: Hematology & Oncology

## 2021-01-15 ENCOUNTER — Other Ambulatory Visit: Payer: Self-pay

## 2021-01-15 ENCOUNTER — Inpatient Hospital Stay (HOSPITAL_BASED_OUTPATIENT_CLINIC_OR_DEPARTMENT_OTHER): Payer: Medicaid Other | Admitting: Family

## 2021-01-15 ENCOUNTER — Other Ambulatory Visit: Payer: Self-pay | Admitting: Family

## 2021-01-15 ENCOUNTER — Ambulatory Visit: Payer: Medicaid Other | Admitting: Occupational Therapy

## 2021-01-15 ENCOUNTER — Encounter: Payer: Self-pay | Admitting: Occupational Therapy

## 2021-01-15 VITALS — BP 121/64 | HR 73 | Temp 98.4°F | Resp 16 | Ht 72.0 in

## 2021-01-15 DIAGNOSIS — Z79899 Other long term (current) drug therapy: Secondary | ICD-10-CM | POA: Diagnosis not present

## 2021-01-15 DIAGNOSIS — R293 Abnormal posture: Secondary | ICD-10-CM | POA: Diagnosis not present

## 2021-01-15 DIAGNOSIS — M6281 Muscle weakness (generalized): Secondary | ICD-10-CM

## 2021-01-15 DIAGNOSIS — Z832 Family history of diseases of the blood and blood-forming organs and certain disorders involving the immune mechanism: Secondary | ICD-10-CM

## 2021-01-15 DIAGNOSIS — M25512 Pain in left shoulder: Secondary | ICD-10-CM | POA: Diagnosis not present

## 2021-01-15 DIAGNOSIS — D573 Sickle-cell trait: Secondary | ICD-10-CM

## 2021-01-15 DIAGNOSIS — R41840 Attention and concentration deficit: Secondary | ICD-10-CM | POA: Diagnosis not present

## 2021-01-15 DIAGNOSIS — R2681 Unsteadiness on feet: Secondary | ICD-10-CM

## 2021-01-15 DIAGNOSIS — I10 Essential (primary) hypertension: Secondary | ICD-10-CM

## 2021-01-15 DIAGNOSIS — Z8673 Personal history of transient ischemic attack (TIA), and cerebral infarction without residual deficits: Secondary | ICD-10-CM | POA: Diagnosis not present

## 2021-01-15 DIAGNOSIS — Z86718 Personal history of other venous thrombosis and embolism: Secondary | ICD-10-CM | POA: Insufficient documentation

## 2021-01-15 DIAGNOSIS — Z86711 Personal history of pulmonary embolism: Secondary | ICD-10-CM | POA: Diagnosis not present

## 2021-01-15 DIAGNOSIS — R2689 Other abnormalities of gait and mobility: Secondary | ICD-10-CM | POA: Diagnosis not present

## 2021-01-15 DIAGNOSIS — Z833 Family history of diabetes mellitus: Secondary | ICD-10-CM

## 2021-01-15 DIAGNOSIS — M25612 Stiffness of left shoulder, not elsewhere classified: Secondary | ICD-10-CM

## 2021-01-15 DIAGNOSIS — I63411 Cerebral infarction due to embolism of right middle cerebral artery: Secondary | ICD-10-CM

## 2021-01-15 DIAGNOSIS — I2699 Other pulmonary embolism without acute cor pulmonale: Secondary | ICD-10-CM

## 2021-01-15 DIAGNOSIS — G8929 Other chronic pain: Secondary | ICD-10-CM

## 2021-01-15 DIAGNOSIS — R208 Other disturbances of skin sensation: Secondary | ICD-10-CM | POA: Diagnosis not present

## 2021-01-15 DIAGNOSIS — F17210 Nicotine dependence, cigarettes, uncomplicated: Secondary | ICD-10-CM

## 2021-01-15 DIAGNOSIS — F32A Depression, unspecified: Secondary | ICD-10-CM | POA: Insufficient documentation

## 2021-01-15 DIAGNOSIS — I8291 Chronic embolism and thrombosis of unspecified vein: Secondary | ICD-10-CM

## 2021-01-15 DIAGNOSIS — I69354 Hemiplegia and hemiparesis following cerebral infarction affecting left non-dominant side: Secondary | ICD-10-CM

## 2021-01-15 LAB — SAVE SMEAR(SSMR), FOR PROVIDER SLIDE REVIEW

## 2021-01-15 LAB — CBC WITH DIFFERENTIAL (CANCER CENTER ONLY)
Abs Immature Granulocytes: 0.02 10*3/uL (ref 0.00–0.07)
Basophils Absolute: 0.1 10*3/uL (ref 0.0–0.1)
Basophils Relative: 1 %
Eosinophils Absolute: 0.2 10*3/uL (ref 0.0–0.5)
Eosinophils Relative: 3 %
HCT: 48.6 % (ref 39.0–52.0)
Hemoglobin: 15.7 g/dL (ref 13.0–17.0)
Immature Granulocytes: 0 %
Lymphocytes Relative: 46 %
Lymphs Abs: 2.8 10*3/uL (ref 0.7–4.0)
MCH: 26.6 pg (ref 26.0–34.0)
MCHC: 32.3 g/dL (ref 30.0–36.0)
MCV: 82.2 fL (ref 80.0–100.0)
Monocytes Absolute: 0.7 10*3/uL (ref 0.1–1.0)
Monocytes Relative: 10 %
Neutro Abs: 2.6 10*3/uL (ref 1.7–7.7)
Neutrophils Relative %: 40 %
Platelet Count: 175 10*3/uL (ref 150–400)
RBC: 5.91 MIL/uL — ABNORMAL HIGH (ref 4.22–5.81)
RDW: 14.9 % (ref 11.5–15.5)
WBC Count: 6.4 10*3/uL (ref 4.0–10.5)
nRBC: 0 % (ref 0.0–0.2)

## 2021-01-15 LAB — CMP (CANCER CENTER ONLY)
ALT: 35 U/L (ref 0–44)
AST: 23 U/L (ref 15–41)
Albumin: 4.6 g/dL (ref 3.5–5.0)
Alkaline Phosphatase: 143 U/L — ABNORMAL HIGH (ref 38–126)
Anion gap: 7 (ref 5–15)
BUN: 11 mg/dL (ref 6–20)
CO2: 29 mmol/L (ref 22–32)
Calcium: 10 mg/dL (ref 8.9–10.3)
Chloride: 105 mmol/L (ref 98–111)
Creatinine: 1.05 mg/dL (ref 0.61–1.24)
GFR, Estimated: >60 mL/min (ref >60)
Glucose, Bld: 87 mg/dL (ref 70–99)
Potassium: 3.5 mmol/L (ref 3.5–5.1)
Sodium: 141 mmol/L (ref 135–145)
Total Bilirubin: 0.6 mg/dL (ref 0.3–1.2)
Total Protein: 8.3 g/dL — ABNORMAL HIGH (ref 6.5–8.1)

## 2021-01-15 LAB — LACTATE DEHYDROGENASE: LDH: 192 U/L (ref 98–192)

## 2021-01-15 NOTE — Progress Notes (Signed)
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 He had an IVC filter placed on 07/21/2019. He developed a cerebral hemorrhage while on IV Heparin during admission and heparin was stopped he has not been on any anticoagulation or aspirin since that time.  He is here today to discuss further work up and evaluation for IVC filter removal.  He has no prior history of thrombus or CVA. No known family history per his father.  He had a craniotomy on 06/05/2021 and states that since then he has not had any headaches.  He does not smoke. He has 1 crown and coke once a week. No recreational drug use at this time.  Only prior surgical history was a cyst removed from the back or the neck which went smoothly without complications.  No known history of sickle cell disease or trait.  No history of diabetes or thyroid disease.  No fever, chills, n/v, cough, rash, dizziness, SOB, chest pain, palpitations, abdominal pain or changes in bowel or bladder habits.  He denies swelling, numbness or tingling in his extremities.  He states that he has some tenderness in the left shoulder after PT.  He is able to ambulate with a cane and uses a wheelchair when needed.  He has maintained a good appetite and is staying well hydrated. His weight is described as stable.   ROS: All other 10 point review of systems is negative.   PAST MEDICAL HISTORY:   Past Medical History:   Diagnosis Date  . 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 Depression   . DVT (deep venous thrombosis) ([place])    BLE DVT 07/21/19, 08/02/19; s/p retrievable IVC filter 07/21/19  . Hemorrhagic stroke ([place])   . History of kidney stones   . Hypertension   . 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 PE (pulmonary thromboembolism) ([place])    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  . Stroke [facility])    RICA, R A1, R MCA occlusion 07/19/19    ALLERGIES: Allergies  Allergen Reactions  . Hydrocodone Nausea Only      MEDICATIONS:  Current Outpatient Medications on File Prior to Visit  Medication Sig Dispense Refill  . acetaminophen (TYLENOL) 325 MG tablet Take 650 mg by mouth every 6 (six) hours as needed for moderate pain.     Marland Kitchen atorvastatin (LIPITOR) 10 MG tablet Take 1 tablet (10 mg total) by mouth daily. 30 tablet 3  . baclofen (LIORESAL) 10 MG tablet Take 1 tablet (10 mg total) by mouth 2 (two) times daily. 60 tablet 1  . metoprolol tartrate (LOPRESSOR) 50 MG tablet TAKE 1 TABLET BY MOUTH TWICE A DAY 180 tablet 1  . venlafaxine XR (EFFEXOR-XR) 37.5 MG 24 hr capsule TAKE 1 CAPSULE BY MOUTH DAILY WITH BREAKFAST. 90 capsule 1   No current facility-administered medications on file prior to visit.     PAST SURGICAL HISTORY Past Surgical  History:  Procedure Laterality Date  . CRANIOPLASTY Right 06/05/2020   Procedure: CRANIOPLASTY;  Surgeon: Consuella Lose, MD;  Location: [place];  Service: Neurosurgery;  Laterality: Right;  right  . CRANIOTOMY Right 07/19/2019   Procedure: RIGHT HEMI-CRANIECTOMY With implantation of skull flap to abdominal wall;  Surgeon: Consuella Lose, MD;  Location: [place];  Service: Neurosurgery;  Laterality: Right;  . CYST EXCISION N/A 10/08/2016   Procedure: EXCISION OF POSTERIOR NECK CYST;  Surgeon: Clovis Riley, MD;  [facility];  Service: General;  Laterality: N/A;  . CYSTOSCOPY/URETEROSCOPY/HOLMIUM LASER/STENT PLACEMENT Right 03/16/2020    Procedure: CYSTOSCOPY RIGHT RETROGRADE PYELOGRAM URETEROSCOPY/HOLMIUM LASER/STENT PLACEMENT;  Surgeon: Lucas Mallow, MD;  [facility];  Service: Urology;  Laterality: Right;  . INCISION AND DRAINAGE ABSCESS N/A 09/22/2014   Procedure: INCISION AND DRAINAGE ABSCESS POSTERIOR NECK;  Surgeon: Pedro Earls, MD;  [facility];  Service: General;  Laterality: N/A;  . INCISION AND DRAINAGE ABSCESS N/A 12/20/2015   Procedure: INCISION AND DRAINAGE POSTERIOR NECK MASS;  Surgeon: Armandina Gemma, MD;  [facility];  Service: General;  Laterality: N/A;  . INCISION AND DRAINAGE ABSCESS Left 07/10/2004   middle finger  . IR IVC FILTER PLMT / S&I /IMG GUID/MOD SED  07/21/2019  . IR RADIOLOGIST EVAL & MGMT  12/14/2019  . IR RADIOLOGIST EVAL & MGMT  12/21/2020  . IR VENOGRAM RENAL UNI RIGHT  07/21/2019  . MASS EXCISION N/A 07/21/2017   Procedure: EXCISION OF BENIGN NECK LESION WITH LAYERED CLOSURE;  Surgeon: Irene Limbo, MD;  Location: [place];  Service: Plastics;  Laterality: N/A;  . MASS EXCISION N/A 11/10/2017   Procedure: EXCISION BENIGN LESION OF THE NECK WITH LAYERED CLOSURE;  Surgeon: Irene Limbo, MD;  Location: [place];  Service: Plastics;  Laterality: N/A;    FAMILY HISTORY: Family History  Problem Relation Age of Onset  . Healthy Mother   . Healthy Father   . Clotting disorder Maternal Grandmother   . Diabetes Paternal Grandfather   . Clotting disorder Maternal Aunt   . Clotting disorder Maternal Uncle   . Colon cancer Neg Hx     SOCIAL HISTORY:  reports that he quit smoking about 5 years ago. He smoked 0.00 packs per day. He has never used smokeless tobacco. He reports previous alcohol use. He reports that he does not use drugs.  PERFORMANCE STATUS: The patient's performance status is 1 - Symptomatic but completely ambulatory  PHYSICAL EXAM: Most Recent Vital Signs: There were no vitals taken for this visit. BP 121/64 (BP  Location: Right Arm, Patient Position: Sitting)   Pulse 73   Temp 98.4 F (36.9 C) (Oral)   Resp 16   Ht 6' (1.829 m)   SpO2 99%   BMI 27.12 kg/m   General Appearance:    Alert, cooperative, no distress, appears stated age  Head:    Normocephalic, without obvious abnormality, atraumatic  Eyes:    PERRL, conjunctiva/corneas clear, EOM's intact, fundi    benign, both eyes             Throat:   Lips, mucosa, and tongue normal; teeth and gums normal  Neck:   Supple, symmetrical, trachea midline, no adenopathy;       thyroid:  No enlargement/tenderness/nodules; no carotid   bruit or JVD  Back:     Symmetric, no curvature, ROM normal, no CVA tenderness  Lungs:     Clear to auscultation bilaterally, respirations unlabored  Chest wall:    No tenderness or deformity  Heart:    Regular rate and rhythm, S1 and S2 normal, no murmur, rub   or gallop  Abdomen:     Soft, non-tender, bowel sounds active all four quadrants,    no masses, no organomegaly        Extremities:   Extremities normal, atraumatic, no cyanosis or edema  Pulses:   2+ and symmetric all extremities  Skin:   Skin color, texture, turgor normal, no rashes or lesions  Lymph nodes:   Cervical, supraclavicular, and axillary nodes normal  Neurologic:   CNII-XII intact. Normal strength, sensation and reflexes      throughout    LABORATORY DATA:  No results found for this or any previous visit (from the past 48 hour(s)).    RADIOGRAPHY: No results found.     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 He did have a cerebral hemorrhage with on Heparin gtt during admission and this was stopped.  His surgeon would like to remove the IVC filter.  We  will repeat and US of the lower extremities to assess for chronic thrombus.  We will also see what his lab work reveals.  Dr. Marin Olp is agreeable to having the IVC filter removed but we will need to determine if he will require anticoagulation once removed.    All questions were answered and he is in agreement with the plan. He can contact our office with any questions or concerns. We can certainly see him sooner if needed.   The patient was discussed with Dr. Marin Olp and he is in agreement with the aforementioned.   Laverna Peace, NP

## 2021-01-15 NOTE — Therapy (Signed)
Matthew Black [address] [place], [place], [address] Phone: [phone]   Fax:  [phone]  Occupational Therapy Treatment  Patient Details  Name: Matthew Black MRN: [id number] Date of Birth: Sep 16, 1976 Referring Provider (OT): Dr Ranell Patrick   Encounter Date: 01/15/2021   OT End of Session - 01/15/21 1853    Visit Number 4    Number of Visits 9    Date for OT Re-Evaluation 03/04/21    Authorization Type approved 8 visits between 2/2-4/13/22    Authorization - Visit Number 3    Authorization - Number of Visits 9    Progress Note Due on Visit 10    OT Start Time 1615    OT Stop Time 1700    OT Time Calculation (min) 45 min    Activity Tolerance Patient tolerated treatment well    Behavior During Therapy [facility] for tasks assessed/performed           Past Medical History:  Diagnosis Date  . 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 Depression   . DVT (deep venous thrombosis) ([place])    BLE DVT 07/21/19, 08/02/19; s/p retrievable IVC filter 07/21/19  . Hemorrhagic stroke ([place])   . History of kidney stones   . Hypertension   . 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 PE (pulmonary thromboembolism) ([place])    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  . Stroke [facility])    RICA, R A1, R MCA occlusion 07/19/19    Past Surgical History:  Procedure Laterality Date  . CRANIOPLASTY Right 06/05/2020   Procedure: CRANIOPLASTY;  Surgeon: Consuella Lose, MD;  Location: [place];  Service: Neurosurgery;  Laterality: Right;  right  . CRANIOTOMY Right 07/19/2019   Procedure: RIGHT HEMI-CRANIECTOMY With implantation of skull flap to abdominal wall;  Surgeon: Consuella Lose, MD;  Location: [place];  Service: Neurosurgery;  Laterality: Right;  . CYST EXCISION N/A 10/08/2016   Procedure: EXCISION OF POSTERIOR NECK CYST;  Surgeon: Clovis Riley, MD;  [facility];  Service: General;  Laterality: N/A;  .  CYSTOSCOPY/URETEROSCOPY/HOLMIUM LASER/STENT PLACEMENT Right 03/16/2020   Procedure: CYSTOSCOPY RIGHT RETROGRADE PYELOGRAM URETEROSCOPY/HOLMIUM LASER/STENT PLACEMENT;  Surgeon: Lucas Mallow, MD;  [facility];  Service: Urology;  Laterality: Right;  . INCISION AND DRAINAGE ABSCESS N/A 09/22/2014   Procedure: INCISION AND DRAINAGE ABSCESS POSTERIOR NECK;  Surgeon: Pedro Earls, MD;  [facility];  Service: General;  Laterality: N/A;  . INCISION AND DRAINAGE ABSCESS N/A 12/20/2015   Procedure: INCISION AND DRAINAGE POSTERIOR NECK MASS;  Surgeon: Armandina Gemma, MD;  [facility];  Service: General;  Laterality: N/A;  . INCISION AND DRAINAGE ABSCESS Left 07/10/2004   middle finger  . IR IVC FILTER PLMT / S&I /IMG GUID/MOD SED  07/21/2019  . IR RADIOLOGIST EVAL & MGMT  12/14/2019  . IR RADIOLOGIST EVAL & MGMT  12/21/2020  . IR VENOGRAM RENAL UNI RIGHT  07/21/2019  . MASS EXCISION N/A 07/21/2017   Procedure: EXCISION OF BENIGN NECK LESION WITH LAYERED CLOSURE;  Surgeon: Irene Limbo, MD;  Location: [place];  Service: Plastics;  Laterality: N/A;  . MASS EXCISION N/A 11/10/2017   Procedure: EXCISION BENIGN LESION OF THE NECK WITH LAYERED CLOSURE;  Surgeon: Irene Limbo, MD;  Location: [place];  Service: Plastics;  Laterality: N/A;    There were no vitals filed for this visit.   Subjective Assessment - 01/15/21 1626  Subjective  It ain't hurting like it was - I think it's the stretches.    Patient is accompanied by: Family member    Pertinent History CVA s/p hemicaniectomy 07/19/19 with abdominal flap implant.  PMH: hx of seizures and headaches s/p CVA, hx of DVT with IVC filter placement    Limitations fall risk, hx of seizures    Patient Stated Goals move/use LUE    Currently in Pain? No/denies    Pain Score 0-No pain                        OT Treatments/Exercises (OP) - 01/15/21 0001      Neurological Re-education  Exercises   Other Exercises 1 Neuromuscular reeducation to address external rotation while supine.  Patient with pain 6/10 at end range of ER - when movement occured more slowly - no report of pain.    Other Exercises 2 Standing weight shift at raised table.  Patient initially resistive to weight shift onto left leg.  Better with repetition.  Initiated very light contact of LUE onto surface and weight shift toward and away from midline.  Patient challenged by this, but willing to work through                    [name] - 01/15/21 1629      OT [place] #1   Title Patient will safely complete self range of motion exercises with min cueing    Baseline dependent    Time 4    Period Weeks    Status Achieved      OT SHORT TERM GOAL #2   Title Patient will put on pull over shirt with set up assistance    Baseline mod assist    Time 4    Period Weeks    Status On-going      OT SHORT TERM GOAL #3   Title Patient will understand positioning for left shoulder for pain relief in bed, and will be able to direct caregiver    Baseline dependent    Time 4    Period Weeks    Status Achieved             OT Long Term Goals - 01/15/21 1855      OT LONG TERM GOAL #1   Title Pt/wife will be independent with updated HEP.    Baseline not yet established    Time 8    Period Weeks    Status On-going      OT LONG TERM GOAL #2   Title Patient will don pull over shirt with modified independence    Baseline mod assist    Time 8    Period Weeks    Status On-going      OT LONG TERM GOAL #3   Title Pt will demo at least 120* L shoulder flex PROM without pain for incr ease with ADLs    Baseline 95    Time 8    Period Weeks    Status On-going      OT LONG TERM GOAL #4   Title Patient will demonstrate at least 30 degrees of passive external rotation in left shoulder    Baseline 0    Time 8    Period Weeks    Status On-going      OT LONG TERM GOAL #5   Title  xxx    Baseline xxx  Plan - 01/15/21 1854    Clinical Impression Statement Pt reports significant improvement in shoulder pain - states he is doing his stretches    OT Occupational Profile and History Detailed Assessment- Review of Records and additional review of physical, cognitive, psychosocial history related to current functional performance    Occupational performance deficits (Please refer to evaluation for details): ADL's;IADL's;Work;Rest and Sleep    Body Structure / Function / Physical Skills ADL;Dexterity;Balance;ROM;IADL;Mobility;Sensation;Strength;Tone;FMC;Coordination;Decreased knowledge of precautions;Decreased knowledge of use of DME;GMC;Pain;UE functional use;Flexibility;Improper spinal/pelvic alignment;Body mechanics;Endurance;Proprioception    Cognitive Skills Attention;Emotional;Energy/Drive    Rehab Potential Good    Clinical Decision Making Several treatment options, min-mod task modification necessary    Comorbidities Affecting Occupational Performance: Presence of comorbidities impacting occupational performance    Comorbidities impacting occupational performance description: history of seizures, spasticity    Modification or Assistance to Complete Evaluation  Min-Moderate modification of tasks or assist with assess necessary to complete eval    OT Frequency 1x / week    OT Duration 8 weeks    OT Treatment/Interventions Self-care/ADL training;DME and/or AE instruction;Fluidtherapy;Moist Heat;Splinting;Balance training;Therapeutic activities;Aquatic Therapy;Ultrasound;Therapeutic exercise;Cognitive remediation/compensation;Passive range of motion;Functional Mobility Training;Neuromuscular education;Cryotherapy;Manual Therapy;Patient/family education;Visual/perceptual remediation/compensation;Energy conservation;Electrical Stimulation    Plan Dressing - socks, shoes, shirt, sleep positions    OT Home Exercise Plan table slides    Consulted and Agree  with Plan of Care Patient           Patient will benefit from skilled therapeutic intervention in order to improve the following deficits and impairments:   Body Structure / Function / Physical Skills: ADL,Dexterity,Balance,ROM,IADL,Mobility,Sensation,Strength,Tone,FMC,Coordination,Decreased knowledge of precautions,Decreased knowledge of use of DME,GMC,Pain,UE functional use,Flexibility,Improper spinal/pelvic alignment,Body mechanics,Endurance,Proprioception Cognitive Skills: Attention,Emotional,Energy/Drive     Visit Diagnosis: Muscle weakness (generalized)  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 S/P craniotomy 06/05/2020  . History of cranioplasty 06/05/2020  . Peri-rectal abscess 02/14/2020  . Abnormal CT scan, pelvis 02/14/2020  . Pancytopenia ([place]) 12/02/2019  . Nephrolithiasis 12/02/2019  . Hydronephrosis with renal and ureteral calculus obstruction 12/02/2019  . Rectal pain 12/02/2019  . Hyperkalemia 12/02/2019  . Reactive depression   . Wound infection after surgery   . Sleep disturbance   . Dysphagia, post-stroke   . Transaminitis   . 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 Cerebral abscess   . Urinary tract infection without hematuria   . Altered mental status   . Primary hypercoagulable state ([place])   . Acute pulmonary embolism without acute cor pulmonale (HCC)   . Deep vein thrombosis (DVT) of non-extremity vein   . Hypokalemia   . Acute blood loss anemia   . Leukocytosis   . Endotracheal tube present   . Acute respiratory failure with hypoxemia ([place])   . Stroke (cerebrum) ([place]) 07/19/2019  . Pressure injury of skin  07/19/2019  . Acute CVA (cerebrovascular accident) ([place])   . Encephalopathy   . Dysphagia   . Acute encephalopathy   . Essential hypertension   . Obesity 03/12/2016  . Scalp abscess 12/20/2015  . Neck abscess 12/20/2015  . Pilonidal cyst 02/08/2013    Mariah Milling, OTR/L 01/15/2021, 6:56 PM  [facility] [address] [place],  [place], [address] Phone: [phone]   Fax:  [phone]  Name: INGRAM ONNEN MRN: [id number] Date of Birth: 1976-08-09

## 2021-01-16 ENCOUNTER — Other Ambulatory Visit: Payer: Self-pay

## 2021-01-16 ENCOUNTER — Encounter: Payer: Self-pay | Admitting: Physical Medicine and Rehabilitation

## 2021-01-16 ENCOUNTER — Encounter
Payer: Medicaid Other | Attending: Physical Medicine and Rehabilitation | Admitting: Physical Medicine and Rehabilitation

## 2021-01-16 VITALS — BP 117/80 | HR 72 | Temp 98.0°F | Ht 72.0 in | Wt 220.0 lb

## 2021-01-16 DIAGNOSIS — R252 Cramp and spasm: Secondary | ICD-10-CM | POA: Insufficient documentation

## 2021-01-16 DIAGNOSIS — R4689 Other symptoms and signs involving appearance and behavior: Secondary | ICD-10-CM | POA: Diagnosis not present

## 2021-01-16 LAB — CARDIOLIPIN ANTIBODIES, IGG, IGM, IGA
Anticardiolipin IgA: <9 APL U/mL (ref 0–11)
Anticardiolipin IgG: 9 GPL U/mL (ref 0–14)
Anticardiolipin IgM: 9 MPL U/mL (ref 0–12)

## 2021-01-16 LAB — LUPUS ANTICOAGULANT PANEL
DRVVT: 35.4 s (ref 0.0–47.0)
PTT Lupus Anticoagulant: 33.4 s (ref 0.0–51.9)

## 2021-01-16 NOTE — Progress Notes (Signed)
Subjective:    Patient ID: Matthew Black, male    DOB: 10/28/1976, 45 y.o.   MRN: [id number]  HPI  Matthew Black is a 45 year old man who presents for follow-up of spasticity and aggression.  1) Spasticity: -Botox to pec was very helpul -Pain was gone next day and has not returned -His PT also noticed pain has been much better controlled -He would like to repeat in 2 months -He keeps stretching out left arm with his right and this has helped to keep the arm loose.   2) Aggression: -Discussed that he has follow-up scheduled with Dr. Sima Matas.   Prior history:  Washing dishes and drying them.  Kidney stones resolved following procedure.  IVC filter to be removed soon. He enjoys cooking, working on stairs. He likes to cook spaghetti and eggs. He is able to walk around the the home. He is able to walk outside as well and wife says that he often walks in front of her.  He plays Scrabble with his kids and enjoys spending time with them.  Has started drinking 1 coffee every morning.   He asks about alcohol use. Has been drinking wine, rum.  Wife states that tightness in elbow and knee flexors has been better and improves the more he walks.  Has sense of humor back.  Headaches have resolved.  He has been driving with his parents next to him and has been doing a great job. No issues except when he tries to get out of the car as he needs he AD at this time. His wife asks about a platform walker for him.   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 His wife accompanies him and provides most of the history.  No longer has pain at surgical site. Wife asks if he should have repeat CT scan. No longer has headache.   Has been having some right sided abdominal pain. Has kidney stone. Has been having daily BM but they are hard. Also has gas.   He has completed his PT and is able to ambulate around his home.    His wife says that prior to his stroke, he liked to play basketball with their kids (ages 44 and 47), play with their dog, travel, and relax at home. He has told her that he is happy to be at home.  He has had tightness in his right elbow and knee flexors and this has been painful to him. He has had two falls and his leg stiffness may have contributed to this.   Pain Inventory Average Pain 0 Pain Right Now 0 My pain is no pain, left sided weakness. Here for Stroke follow up.  In the last 24 hours, has pain interfered with the following? General activity 0 Relation with others 0 Enjoyment of life 0 What TIME of day is your pain at its worst? no pain Sleep (in general) Good  Pain is worse with: no pain Pain improves with: na Relief from Meds: no pain  7 Bowel movements per week No problem with urination.      Family History  Problem Relation Age of Onset  . Healthy Mother   . Healthy Father   . Clotting disorder Maternal Grandmother   . Diabetes Paternal Grandfather   . Clotting disorder Maternal Aunt   . Clotting disorder Maternal Uncle   . Colon cancer Neg Hx  Social History   Socioeconomic History  . Marital status: Married    Spouse name: Not on file  . Number of children: 2  . Years of education: Not on file  . Highest education level: Not on file  Occupational History  . Occupation: Clinical biochemist   Tobacco Use  . Smoking status: Former Smoker    Packs/day: 0.00    Quit date: 11/24/2015    Years since quitting: 5.1  . Smokeless tobacco: Never Used  . Tobacco comment:    Vaping Use  . Vaping Use: Never used  Substance and Sexual Activity  . Alcohol use: Not Currently  . Drug use: Never  . Sexual activity: Not Currently  Other Topics Concern  . Not on file  Social History Narrative   Household-- pt, wife, 2 children   Social Determinants of Health   Financial Resource Strain: Not on file  Food Insecurity: Not on file  Transportation Needs:  Not on file  Physical Activity: Not on file  Stress: Not on file  Social Connections: Not on file   Past Surgical History:  Procedure Laterality Date  . CRANIOPLASTY Right 06/05/2020   Procedure: CRANIOPLASTY;  Surgeon: Consuella Lose, MD;  Location: [place];  Service: Neurosurgery;  Laterality: Right;  right  . CRANIOTOMY Right 07/19/2019   Procedure: RIGHT HEMI-CRANIECTOMY With implantation of skull flap to abdominal wall;  Surgeon: Consuella Lose, MD;  Location: [place];  Service: Neurosurgery;  Laterality: Right;  . CYST EXCISION N/A 10/08/2016   Procedure: EXCISION OF POSTERIOR NECK CYST;  Surgeon: Clovis Riley, MD;  [facility];  Service: General;  Laterality: N/A;  . CYSTOSCOPY/URETEROSCOPY/HOLMIUM LASER/STENT PLACEMENT Right 03/16/2020   Procedure: CYSTOSCOPY RIGHT RETROGRADE PYELOGRAM URETEROSCOPY/HOLMIUM LASER/STENT PLACEMENT;  Surgeon: Lucas Mallow, MD;  [facility];  Service: Urology;  Laterality: Right;  . INCISION AND DRAINAGE ABSCESS N/A 09/22/2014   Procedure: INCISION AND DRAINAGE ABSCESS POSTERIOR NECK;  Surgeon: Pedro Earls, MD;  [facility];  Service: General;  Laterality: N/A;  . INCISION AND DRAINAGE ABSCESS N/A 12/20/2015   Procedure: INCISION AND DRAINAGE POSTERIOR NECK MASS;  Surgeon: Armandina Gemma, MD;  [facility];  Service: General;  Laterality: N/A;  . INCISION AND DRAINAGE ABSCESS Left 07/10/2004   middle finger  . IR IVC FILTER PLMT / S&I /IMG GUID/MOD SED  07/21/2019  . IR RADIOLOGIST EVAL & MGMT  12/14/2019  . IR RADIOLOGIST EVAL & MGMT  12/21/2020  . IR VENOGRAM RENAL UNI RIGHT  07/21/2019  . MASS EXCISION N/A 07/21/2017   Procedure: EXCISION OF BENIGN NECK LESION WITH LAYERED CLOSURE;  Surgeon: Irene Limbo, MD;  Location: [place];  Service: Plastics;  Laterality: N/A;  . MASS EXCISION N/A 11/10/2017   Procedure: EXCISION BENIGN LESION OF THE NECK WITH LAYERED CLOSURE;  Surgeon: Irene Limbo, MD;   Location: [place];  Service: Plastics;  Laterality: N/A;   Past Medical History:  Diagnosis Date  . 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 Depression   . DVT (deep venous thrombosis) ([place])    BLE DVT 07/21/19, 08/02/19; s/p retrievable IVC filter 07/21/19  . Hemorrhagic stroke ([place])   . History of kidney stones   . Hypertension   . 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 PE (pulmonary thromboembolism) ([place])    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  . Stroke [facility])    RICA, R A1, R MCA occlusion 07/19/19  There were no vitals taken for this visit.  Opioid Risk Score:   Fall Risk Score:  `1  Depression screen PHQ 2/9  Depression screen [facility] 2/9 09/26/2020 03/08/2020  Decreased Interest 0 3  Down, Depressed, Hopeless 0 3  PHQ - 2 Score 0 6  Some recent data might be hidden     Review of Systems  Constitutional: Negative.   HENT: Negative.   Eyes: Negative.   Respiratory: Negative.   Cardiovascular: Negative.   Gastrointestinal: Negative.   Genitourinary: Negative.   Musculoskeletal: Positive for gait problem.  Skin: Negative.   Allergic/Immunologic: Negative.   Neurological: Positive for weakness.  Hematological: Negative.   Psychiatric/Behavioral: Positive for dysphoric mood.  All other systems reviewed and are negative.      Objective:   Physical Exam Gen: no distress, normal appearing HEENT: oral mucosa pink and moist, NCAT Cardio: Reg rate Chest: normal effort, normal rate of breathing Abd: soft, non-distended Ext: no edema Psych: pleasant, normal affect Skin: Cranial incision has healed very well! Hair growing over nicely.  Psych:Much better communication and bright affect! Was upset and leaned over when I discussed that return to driving would be a slow process.  Musc: No edema in extremities. No tenderness in extremities. Neuro: 0/5 LUE except for 1/5 SA. LLE with 3/5 HF, KE, 0/5 DF and PF. Left inattention persists, tone  much improved throughout left side.  RUE/RLE appear to be 5/5 proximal distal--stable     Assessment & Plan:  Matthew Black is a a 45 year old man who presents for follow-up following his right middle cerebral artery stroke. His wife accompanies him and provides most of the history.  1) Headache -Continues to be resolved. Advised that he does not required repeat CT Head unless he develops symptoms and signs of infection or bleed--discussed which to look out for.   2) Abdominal pain: -Resolved following surgical removal of kidney stones  3) Weakness/spasticity: -Left sided tone much improved after Botox. Would like to repeat in 2 months. Will target left pec major again. Arm is currently without signficant tone -Encouraged him to keep stretching left arm.  -Discussed transcranial magnetic stimulation as a an option to improve muscle strength. Also discussed [employer] PNS as an option. Provided links and phones numbers for both options for wife to investigate further.  -Made goal to walk outside 15 minutes per day, start resistance training with low weights.  -Provided script for platform walker -Provided script for occupational therapy.   4) Aggression: -provided referral to see Dr. Sima Matas  Return to driving: -Once parents clear him after driving next to him > times he may drive on his own.   Cranial incision healing well.   Quality of life: Discussed spending time with his kids, playing games to keep his mind sharp.   All questions were encouraged and answered. Follow up with me in 6 weeks.

## 2021-01-17 LAB — HGB FRACTIONATION CASCADE
Hgb A2: 2.7 % (ref 1.8–3.2)
Hgb A: 97.3 % (ref 96.4–98.8)
Hgb F: 0 % (ref 0.0–2.0)
Hgb S: 0 %

## 2021-01-23 ENCOUNTER — Other Ambulatory Visit: Payer: Self-pay

## 2021-01-23 ENCOUNTER — Encounter: Payer: Self-pay | Admitting: Occupational Therapy

## 2021-01-23 ENCOUNTER — Ambulatory Visit: Payer: Medicaid Other | Attending: Physician Assistant | Admitting: Occupational Therapy

## 2021-01-23 DIAGNOSIS — R2681 Unsteadiness on feet: Secondary | ICD-10-CM | POA: Diagnosis present

## 2021-01-23 DIAGNOSIS — R293 Abnormal posture: Secondary | ICD-10-CM | POA: Diagnosis present

## 2021-01-23 DIAGNOSIS — R208 Other disturbances of skin sensation: Secondary | ICD-10-CM | POA: Insufficient documentation

## 2021-01-23 DIAGNOSIS — M6281 Muscle weakness (generalized): Secondary | ICD-10-CM | POA: Diagnosis present

## 2021-01-23 DIAGNOSIS — M25512 Pain in left shoulder: Secondary | ICD-10-CM | POA: Insufficient documentation

## 2021-01-23 DIAGNOSIS — M25612 Stiffness of left shoulder, not elsewhere classified: Secondary | ICD-10-CM | POA: Diagnosis present

## 2021-01-23 DIAGNOSIS — R41840 Attention and concentration deficit: Secondary | ICD-10-CM | POA: Diagnosis present

## 2021-01-23 DIAGNOSIS — I69354 Hemiplegia and hemiparesis following cerebral infarction affecting left non-dominant side: Secondary | ICD-10-CM | POA: Diagnosis present

## 2021-01-23 DIAGNOSIS — G8929 Other chronic pain: Secondary | ICD-10-CM | POA: Insufficient documentation

## 2021-01-23 NOTE — Therapy (Signed)
[facility] [address] [place], [place], [address] Phone: [phone]   Fax:  [phone]  Occupational Therapy Treatment  Patient Details  Name: Matthew Black MRN: [id number] Date of Birth: 1976-03-06 Referring Provider (OT): Dr Ranell Patrick   Encounter Date: 01/23/2021   OT End of Session - 01/23/21 1711    Visit Number 5    Number of Visits 9    Date for OT Re-Evaluation 03/04/21    Authorization Type approved 8 visits between 2/2-4/13/22    Authorization - Visit Number 4    Authorization - Number of Visits 9    Progress Note Due on Visit 10    OT Start Time 1315    OT Stop Time 1400    OT Time Calculation (min) 45 min    Activity Tolerance Patient tolerated treatment well    Behavior During Therapy [facility] for tasks assessed/performed           Past Medical History:  Diagnosis Date  . 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 Depression   . DVT (deep venous thrombosis) ([place])    BLE DVT 07/21/19, 08/02/19; s/p retrievable IVC filter 07/21/19  . Hemorrhagic stroke ([place])   . History of kidney stones   . Hypertension   . 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 PE (pulmonary thromboembolism) ([place])    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  . Stroke [facility])    RICA, R A1, R MCA occlusion 07/19/19    Past Surgical History:  Procedure Laterality Date  . CRANIOPLASTY Right 06/05/2020   Procedure: CRANIOPLASTY;  Surgeon: Consuella Lose, MD;  Location: [facility] [place];  Service: Neurosurgery;  Laterality: Right;  right  . CRANIOTOMY Right 07/19/2019   Procedure: RIGHT HEMI-CRANIECTOMY With implantation of skull flap to abdominal wall;  Surgeon: Consuella Lose, MD;  Location: [place];  Service: Neurosurgery;  Laterality: Right;  . CYST EXCISION N/A 10/08/2016   Procedure: EXCISION OF POSTERIOR NECK CYST;  Surgeon: Clovis Riley, MD;  Location: [facility];  Service: General;  Laterality: N/A;  .  CYSTOSCOPY/URETEROSCOPY/HOLMIUM LASER/STENT PLACEMENT Right 03/16/2020   Procedure: CYSTOSCOPY RIGHT RETROGRADE PYELOGRAM URETEROSCOPY/HOLMIUM LASER/STENT PLACEMENT;  Surgeon: Lucas Mallow, MD;  Location: [facility];  Service: Urology;  Laterality: Right;  . INCISION AND DRAINAGE ABSCESS N/A 09/22/2014   Procedure: INCISION AND DRAINAGE ABSCESS POSTERIOR NECK;  Surgeon: Pedro Earls, MD;  Location[facility];  Service: General;  Laterality: N/A;  . INCISION AND DRAINAGE ABSCESS N/A 12/20/2015   Procedure: INCISION AND DRAINAGE POSTERIOR NECK MASS;  Surgeon: Armandina Gemma, MD;  Location: [facility];  Service: General;  Laterality: N/A;  . INCISION AND DRAINAGE ABSCESS Left 07/10/2004   middle finger  . IR IVC FILTER PLMT / S&I /IMG GUID/MOD SED  07/21/2019  . IR RADIOLOGIST EVAL & MGMT  12/14/2019  . IR RADIOLOGIST EVAL & MGMT  12/21/2020  . IR VENOGRAM RENAL UNI RIGHT  07/21/2019  . MASS EXCISION N/A 07/21/2017   Procedure: EXCISION OF BENIGN NECK LESION WITH LAYERED CLOSURE;  Surgeon: Irene Limbo, MD;  Location: [place];  Service: Plastics;  Laterality: N/A;  . MASS EXCISION N/A 11/10/2017   Procedure: EXCISION BENIGN LESION OF THE NECK WITH LAYERED CLOSURE;  Surgeon: Irene Limbo, MD;  Location: [place];  Service: Plastics;  Laterality: N/A;    There were no vitals filed for this visit.   Subjective Assessment - 01/23/21 1328  Subjective  My motion and my pain level are better    Pertinent History CVA s/p hemicaniectomy 07/19/19 with abdominal flap implant.  PMH: hx of seizures and headaches s/p CVA, hx of DVT with IVC filter placement    Limitations fall risk, hx of seizures    Patient Stated Goals move/use LUE    Currently in Pain? No/denies    Pain Score 0-No pain                        OT Treatments/Exercises (OP) - 01/23/21 0001      Neurological Re-education Exercises   Other Exercises 1 Neuromuscular reeducation to address  postural control in transitional movements.  Patient remains very fearful of weight shift onto left leg in sit to stand and standing.  Causes increased tension in LUE.  Worked on seated midline orientation, then active tipping chair to promote flex/ ext at shoulder.  Transitioned to standing to address the same - flexion with elbow extension and revers with emphasis on balanced standing on two legs.                    OT Short Term Goals - 01/23/21 1712      OT SHORT TERM GOAL #1   Title Patient will safely complete self range of motion exercises with min cueing    Baseline dependent    Time 4    Period Weeks    Status Achieved      OT SHORT TERM GOAL #2   Title Patient will put on pull over shirt with set up assistance    Baseline mod assist    Time 4    Period Weeks    Status On-going      OT SHORT TERM GOAL #3   Title Patient will understand positioning for left shoulder for pain relief in bed, and will be able to direct caregiver    Baseline dependent    Time 4    Period Weeks    Status Achieved             OT Long Term Goals - 01/23/21 1713      OT LONG TERM GOAL #1   Title Pt/wife will be independent with updated HEP.    Baseline not yet established    Time 8    Period Weeks    Status On-going      OT LONG TERM GOAL #2   Title Patient will don pull over shirt with modified independence    Baseline mod assist    Time 8    Period Weeks    Status On-going      OT LONG TERM GOAL #3   Title Pt will demo at least 120* L shoulder flex PROM without pain for incr ease with ADLs    Baseline 95    Time 8    Period Weeks    Status On-going      OT LONG TERM GOAL #4   Title Patient will demonstrate at least 30 degrees of passive external rotation in left shoulder    Baseline 0    Time 8    Period Weeks    Status On-going      OT LONG TERM GOAL #5   Title xxx    Baseline xxx                 Plan - 01/23/21 1712    Clinical Impression  Statement Pt  reports having some mind trouble - feeling sad, and anxious about being out in public.  Encouraged patient to keep talking and highlighted support and progress.    OT Occupational Profile and History Detailed Assessment- Review of Records and additional review of physical, cognitive, psychosocial history related to current functional performance    Occupational performance deficits (Please refer to evaluation for details): ADL's;IADL's;Work;Rest and Sleep    Body Structure / Function / Physical Skills ADL;Dexterity;Balance;ROM;IADL;Mobility;Sensation;Strength;Tone;FMC;Coordination;Decreased knowledge of precautions;Decreased knowledge of use of DME;GMC;Pain;UE functional use;Flexibility;Improper spinal/pelvic alignment;Body mechanics;Endurance;Proprioception    Cognitive Skills Attention;Emotional;Energy/Drive    Rehab Potential Good    Clinical Decision Making Several treatment options, min-mod task modification necessary    Comorbidities Affecting Occupational Performance: Presence of comorbidities impacting occupational performance    Comorbidities impacting occupational performance description: history of seizures, spasticity    Modification or Assistance to Complete Evaluation  Min-Moderate modification of tasks or assist with assess necessary to complete eval    OT Frequency 1x / week    OT Duration 8 weeks    OT Treatment/Interventions Self-care/ADL training;DME and/or AE instruction;Fluidtherapy;Moist Heat;Splinting;Balance training;Therapeutic activities;Aquatic Therapy;Ultrasound;Therapeutic exercise;Cognitive remediation/compensation;Passive range of motion;Functional Mobility Training;Neuromuscular education;Cryotherapy;Manual Therapy;Patient/family education;Visual/perceptual remediation/compensation;Energy conservation;Electrical Stimulation    Plan Dressing - socks, shoes, shirt, sleep positions    OT Home Exercise Plan table slides    Consulted and Agree with Plan of Care  Patient           Patient will benefit from skilled therapeutic intervention in order to improve the following deficits and impairments:   Body Structure / Function / Physical Skills: ADL,Dexterity,Balance,ROM,IADL,Mobility,Sensation,Strength,Tone,FMC,Coordination,Decreased knowledge of precautions,Decreased knowledge of use of DME,GMC,Pain,UE functional use,Flexibility,Improper spinal/pelvic alignment,Body mechanics,Endurance,Proprioception Cognitive Skills: Attention,Emotional,Energy/Drive     Visit Diagnosis: Muscle weakness (generalized)  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 S/P craniotomy 06/05/2020  . History of cranioplasty 06/05/2020  . Peri-rectal abscess 02/14/2020  . Abnormal CT scan, pelvis 02/14/2020  . Pancytopenia ([place]) 12/02/2019  . Nephrolithiasis 12/02/2019  . Hydronephrosis with renal and ureteral calculus obstruction 12/02/2019  . Rectal pain 12/02/2019  . Hyperkalemia 12/02/2019  . Reactive depression   . Wound infection after surgery   . Sleep disturbance   . Dysphagia, post-stroke   . Transaminitis   . 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 Cerebral abscess   . Urinary tract infection without hematuria   . Altered mental status   . Primary hypercoagulable state ([place])   . Acute pulmonary embolism without acute cor pulmonale (HCC)   . Deep vein thrombosis (DVT) of non-extremity vein   . Hypokalemia   . Acute blood loss anemia   . Leukocytosis   . Endotracheal tube present   . Acute respiratory failure with hypoxemia ([place])   . Stroke (cerebrum) ([place]) 07/19/2019  . Pressure injury of skin 07/19/2019  . Acute  CVA (cerebrovascular accident) ([place])   . Encephalopathy   . Dysphagia   . Acute encephalopathy   . Essential hypertension   . Obesity 03/12/2016  . Scalp abscess 12/20/2015  . Neck abscess 12/20/2015  . Pilonidal cyst 02/08/2013    Mariah Milling, OTR/L 01/23/2021, 5:13 PM  [facility] [address]  [place], [place], [address] Phone: [phone]   Fax:  [phone]  Name: Matthew Black MRN: [id number] Date of Birth: 1976-07-20

## 2021-01-25 ENCOUNTER — Ambulatory Visit (HOSPITAL_BASED_OUTPATIENT_CLINIC_OR_DEPARTMENT_OTHER)
Admission: RE | Admit: 2021-01-25 | Discharge: 2021-01-25 | Disposition: A | Discharge status: Home / Self Care | Payer: Medicaid Other | Source: Ambulatory Visit | Attending: Diagnostic Radiology | Admitting: Diagnostic Radiology

## 2021-01-25 ENCOUNTER — Other Ambulatory Visit: Payer: Self-pay

## 2021-01-25 DIAGNOSIS — Z86718 Personal history of other venous thrombosis and embolism: Secondary | ICD-10-CM | POA: Diagnosis not present

## 2021-01-25 DIAGNOSIS — I824Y9 Acute embolism and thrombosis of unspecified deep veins of unspecified proximal lower extremity: Secondary | ICD-10-CM | POA: Diagnosis not present

## 2021-01-25 DIAGNOSIS — I639 Cerebral infarction, unspecified: Secondary | ICD-10-CM | POA: Diagnosis not present

## 2021-01-30 ENCOUNTER — Ambulatory Visit: Payer: Medicaid Other | Admitting: Neurology

## 2021-01-30 ENCOUNTER — Other Ambulatory Visit: Payer: Self-pay

## 2021-01-30 ENCOUNTER — Encounter: Payer: Self-pay | Admitting: Neurology

## 2021-01-30 ENCOUNTER — Encounter: Payer: Self-pay | Admitting: Occupational Therapy

## 2021-01-30 ENCOUNTER — Ambulatory Visit: Payer: Medicaid Other | Admitting: Occupational Therapy

## 2021-01-30 VITALS — BP 117/80 | HR 69 | Ht 75.0 in | Wt 220.0 lb

## 2021-01-30 DIAGNOSIS — M6281 Muscle weakness (generalized): Secondary | ICD-10-CM | POA: Diagnosis not present

## 2021-01-30 DIAGNOSIS — I69954 Hemiplegia and hemiparesis following unspecified cerebrovascular disease affecting left non-dominant side: Secondary | ICD-10-CM | POA: Diagnosis not present

## 2021-01-30 DIAGNOSIS — M25512 Pain in left shoulder: Secondary | ICD-10-CM

## 2021-01-30 DIAGNOSIS — I6521 Occlusion and stenosis of right carotid artery: Secondary | ICD-10-CM | POA: Diagnosis not present

## 2021-01-30 DIAGNOSIS — I639 Cerebral infarction, unspecified: Secondary | ICD-10-CM

## 2021-01-30 DIAGNOSIS — R2681 Unsteadiness on feet: Secondary | ICD-10-CM

## 2021-01-30 DIAGNOSIS — I69354 Hemiplegia and hemiparesis following cerebral infarction affecting left non-dominant side: Secondary | ICD-10-CM

## 2021-01-30 DIAGNOSIS — G8929 Other chronic pain: Secondary | ICD-10-CM

## 2021-01-30 DIAGNOSIS — R41840 Attention and concentration deficit: Secondary | ICD-10-CM

## 2021-01-30 DIAGNOSIS — R208 Other disturbances of skin sensation: Secondary | ICD-10-CM

## 2021-01-30 DIAGNOSIS — M25612 Stiffness of left shoulder, not elsewhere classified: Secondary | ICD-10-CM

## 2021-01-30 DIAGNOSIS — R293 Abnormal posture: Secondary | ICD-10-CM

## 2021-01-30 MED ORDER — ASPIRIN EC 81 MG PO TBEC: 81.0000 mg | DELAYED_RELEASE_TABLET | Freq: Every day | ORAL | 11 refills | Status: DC

## 2021-01-30 NOTE — Patient Instructions (Signed)
I had a long discussion with the patient and his wife regarding his large right hemispheric infarct with malignant cerebral edema requiring hemicraniectomy with residual spastic hemiplegia and answered questions.  I recommended starting aspirin 81 mg daily for stroke prevention and maintain aggressive risk factor modification strict control of hypertension with blood pressure goal below 130/90, lipids with LDL cholesterol goal below 70 mg percent and diabetes with hemoglobin A1c goal below 6.5%.  He was encouraged to continue to use his quad cane and ambulate and we discussed fall safety precautions.  Check lipid profile and hemoglobin A1c as well as CT angiogram of brain and neck.  Return for follow-up in future in 3 months with my nurse practitioner Janett Billow or call earlier if necessary.  Stroke Prevention Some medical conditions and behaviors are associated with a higher chance of having a stroke. You can help prevent a stroke by making nutrition, lifestyle, and other changes, including managing any medical conditions you may have. What nutrition changes can be made?  Eat healthy foods. You can do this by: ? Choosing foods high in fiber, such as fresh fruits and vegetables and whole grains. ? Eating at least 5 or more servings of fruits and vegetables a day. Try to fill half of your plate at each meal with fruits and vegetables. ? Choosing lean protein foods, such as lean cuts of meat, poultry without skin, fish, tofu, beans, and nuts. ? Eating low-fat dairy products. ? Avoiding foods that are high in salt (sodium). This can help lower blood pressure. ? Avoiding foods that have saturated fat, trans fat, and cholesterol. This can help prevent high cholesterol. ? Avoiding processed and premade foods.  Follow your health care provider's specific guidelines for losing weight, controlling high blood pressure (hypertension), lowering high cholesterol, and managing diabetes. These may include: ? Reducing  your daily calorie intake. ? Limiting your daily sodium intake to 1,500 milligrams (mg). ? Using only healthy fats for cooking, such as olive oil, canola oil, or sunflower oil. ? Counting your daily carbohydrate intake.   What lifestyle changes can be made?  Maintain a healthy weight. Talk to your health care provider about your ideal weight.  Get at least 30 minutes of moderate physical activity at least 5 days a week. Moderate activity includes brisk walking, biking, and swimming.  Do not use any products that contain nicotine or tobacco, such as cigarettes and e-cigarettes. If you need help quitting, ask your health care provider. It may also be helpful to avoid exposure to secondhand smoke.  Limit alcohol intake to no more than 1 drink a day for nonpregnant women and 2 drinks a day for men. One drink equals 12 oz of beer, 5 oz of wine, or 1 oz of hard liquor.  Stop any illegal drug use.  Avoid taking birth control pills. Talk to your health care provider about the risks of taking birth control pills if: ? You are over 71 years old. ? You smoke. ? You get migraines. ? You have ever had a blood clot. What other changes can be made?  Manage your cholesterol levels. ? Eating a healthy diet is important for preventing high cholesterol. If cholesterol cannot be managed through diet alone, you may also need to take medicines. ? Take any prescribed medicines to control your cholesterol as told by your health care provider.  Manage your diabetes. ? Eating a healthy diet and exercising regularly are important parts of managing your blood sugar. If your blood sugar  cannot be managed through diet and exercise, you may need to take medicines. ? Take any prescribed medicines to control your diabetes as told by your health care provider.  Control your hypertension. ? To reduce your risk of stroke, try to keep your blood pressure below 130/80. ? Eating a healthy diet and exercising regularly  are an important part of controlling your blood pressure. If your blood pressure cannot be managed through diet and exercise, you may need to take medicines. ? Take any prescribed medicines to control hypertension as told by your health care provider. ? Ask your health care provider if you should monitor your blood pressure at home. ? Have your blood pressure checked every year, even if your blood pressure is normal. Blood pressure increases with age and some medical conditions.  Get evaluated for sleep disorders (sleep apnea). Talk to your health care provider about getting a sleep evaluation if you snore a lot or have excessive sleepiness.  Take over-the-counter and prescription medicines only as told by your health care provider. Aspirin or blood thinners (antiplatelets or anticoagulants) may be recommended to reduce your risk of forming blood clots that can lead to stroke.  Make sure that any other medical conditions you have, such as atrial fibrillation or atherosclerosis, are managed. What are the warning signs of a stroke? The warning signs of a stroke can be easily remembered as BEFAST.  B is for balance. Signs include: ? Dizziness. ? Loss of balance or coordination. ? Sudden trouble walking.  E is for eyes. Signs include: ? A sudden change in vision. ? Trouble seeing.  F is for face. Signs include: ? Sudden weakness or numbness of the face. ? The face or eyelid drooping to one side.  A is for arms. Signs include: ? Sudden weakness or numbness of the arm, usually on one side of the body.  S is for speech. Signs include: ? Trouble speaking (aphasia). ? Trouble understanding.  T is for time. ? These symptoms may represent a serious problem that is an emergency. Do not wait to see if the symptoms will go away. Get medical help right away. Call your local emergency services (911 in the [place] Do not drive yourself to the hospital.  Other signs of stroke may include: ? A  sudden, severe headache with no known cause. ? Nausea or vomiting. ? Seizure. Where to find more information For more information, visit:  American Stroke Association: www.strokeassociation.org  National Stroke Association: www.stroke.org Summary  You can prevent a stroke by eating healthy, exercising, not smoking, limiting alcohol intake, and managing any medical conditions you may have.  Do not use any products that contain nicotine or tobacco, such as cigarettes and e-cigarettes. If you need help quitting, ask your health care provider. It may also be helpful to avoid exposure to secondhand smoke.  Remember BEFAST for warning signs of stroke. Get help right away if you or a loved one has any of these signs. This information is not intended to replace advice given to you by your health care provider. Make sure you discuss any questions you have with your health care provider. Document Revised: 10/23/2017 Document Reviewed: 12/16/2016 Elsevier Patient Education  2021 [employer].

## 2021-01-30 NOTE — Progress Notes (Signed)
[facility] [address] [place]. [place] [address] [phone]       OFFICE FOLLOW-UP NOTE  [name] Date of Birth:  August 14, 1976 Medical Record Number:  [id number]   HPI: Matthew Black is a 45 year old African-American male seen today for initial office follow-up visit following hospital admission for stroke in August 2020.  He is accompanied by his wife.  History is obtained from them, review of electronic medical records and personally reviewed available imaging films in PACS.  Patient has past medical stay of hypertension and kidney stones.  He presented on 07/18/2021 [facility] being found somnolent and nonverbal in the morning with left hemiplegia.  The wife left at 8:30 in the morning when the patient seemed fine.  She did not hear back from him the rest of the day.  He was found the next day at home with dried vomit around his mouth and covered in urine.  Urine drug screen was positive for cannabis.  CT scan of the head on admission showed a large right hemispheric infarct with cytotoxic edema involving the right anterior cerebral and middle cerebral artery territories with mild mass-effect and no hemorrhage.  MRA showed complete occlusion of the right internal carotid artery right anterior cerebral and middle cerebral arteries.  He was intubated for airway protection and started on hypertonic saline.  He underwent decompressive right hemicraniectomy to me by Dr. Kathyrn Sheriff on 07/19/2019.  His hospital course was complicated by hemorrhagic conversion with hematoma 07/30/2019 as well as further clinical worsening on 08/14/2019.Marland Kitchen He had a prolonged hospital course which was complicated by lower extremity DVT in the right peroneal, left popliteal and left posterior tibial and left peroneal veins.  TCD bubble study was negative for PFO.  LDL cholesterol is 83 mg percent.  Urine drug screen was positive for cannabis.  Hemoglobin A1c was 5.4.  Hypercoagulable panel and  autoimmune work-up was negative.  He was started on IV heparin for DVT but after he developed hemorrhagic transformation it had to be temporarily stopped.  Follow-up CT scan showed irregular collections beneath the craniectomy site with question of disruption of dura with CSF leak or possible infection.  MRI on 08/20/2019 showed extremely heavy herniation of the brain into the craniectomy site with large irregular enhancing fluid collection along the anterior margin of the craniectomy site measuring 5.4 x 9.8 cm which could represent possible abscess versus sterile hematoma versus infected hematoma.  This fluid collection was aspirated and sent for cultures.  No growth was found.  Patient had initial IV heparin started but when he developed hemorrhagic transformation IVC filter was placed on 07/21/2019 with plan for retrieval later.  Chest CT scan also showed nonocclusive thrombus in the left posterior lower lobe pulmonary segmental artery.  He also developed abdominal wall hematoma which was followed conservatively.  He had possible seizure-like activity and twitchings but long-term EEG monitoring showed right frontal dysfunction but no definite seizure activity.  He was placed on Keppra for short-term.  He developed a UTI which grew Pseudomonas which was treated with antibiotics.  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  . Patient is head outpatient follow-up with rehab clinic in is currently getting Botox injections and baclofen for his spasticity.  He is able to ambulate independently using a quad cane and has a left ankle-foot brace.  He has not obtained any significant improvement in his left hand which is contracted at the left wrist and  fingers but he is able to move his leg much better except he has a foot drop.  He is currently not on aspirin.  He had follow-up lower extremity venous Doppler on 01/25/2021 which showed no evidence of DVT.  ROS:   14 system review of systems  is positive for weakness, gait difficulty, spasticity, vision loss and all other systems negative  PMH:  Past Medical History:  Diagnosis Date  . 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 Depression   . DVT (deep venous thrombosis) ([place])    BLE DVT 07/21/19, 08/02/19; s/p retrievable IVC filter 07/21/19  . Hemorrhagic stroke ([place])   . History of kidney stones   . Hypertension   . 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 PE (pulmonary thromboembolism) ([place])    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  . Stroke [facility])    RICA, R A1, R MCA occlusion 07/19/19    Social History:  Social History   Socioeconomic History  . Marital status: Married    Spouse name: Melene Muller  . Number of children: 2  . Years of education: Not on file  . Highest education level: Not on file  Occupational History  . Occupation: unemployed 01/30/21  Tobacco Use  . Smoking status: Former Smoker    Packs/day: 0.00    Quit date: 11/24/2015    Years since quitting: 5.1  . Smokeless tobacco: Never Used  . Tobacco comment:    Vaping Use  . Vaping Use: Never used  Substance and Sexual Activity  . Alcohol use: Yes    Comment: socially  . Drug use: Never  . Sexual activity: Not Currently  Other Topics Concern  . Not on file  Social History Narrative   Lives with wife and kids   Right Handed   Drinks >10 cups caffeine daily   Social Determinants of Health   Financial Resource Strain: Not on file  Food Insecurity: Not on file  Transportation Needs: Not on file  Physical Activity: Not on file  Stress: Not on file  Social Connections: Not on file  Intimate Partner Violence: Not on file    Medications:   Current Outpatient Medications on File Prior to Visit  Medication Sig Dispense Refill  . atorvastatin (LIPITOR) 10 MG tablet Take 1 tablet (10 mg total) by mouth daily. 30 tablet 3  . baclofen (LIORESAL) 10 MG tablet Take 1 tablet (10 mg total) by mouth 2 (two) times daily. 60 tablet 1  .  metoprolol tartrate (LOPRESSOR) 50 MG tablet TAKE 1 TABLET BY MOUTH TWICE A DAY 180 tablet 1  . traMADol (ULTRAM) 50 MG tablet Take by mouth.    . venlafaxine XR (EFFEXOR-XR) 37.5 MG 24 hr capsule TAKE 1 CAPSULE BY MOUTH DAILY WITH BREAKFAST. 90 capsule 1   No current facility-administered medications on file prior to visit.    Allergies:   Allergies  Allergen Reactions  . Hydrocodone Nausea Only    Physical Exam General: well developed, well nourished middle-aged African-American male, seated, in no evident distress Head: head normocephalic and atraumatic.  Neck: supple with no carotid or supraclavicular bruits Cardiovascular: regular rate and rhythm, no murmurs Musculoskeletal: no deformity Skin:  no rash/petichiae Vascular:  Normal pulses all extremities Vitals:   01/30/21 1131  BP: 117/80  Pulse: 69   Neurologic Exam Mental Status: Awake and fully alert. Oriented to place and time. Recent and remote memory intact. Attention span, concentration and fund of knowledge appropriate. Mood and  affect appropriate.  Cranial Nerves: Fundoscopic exam reveals sharp disc margins. Pupils equal, briskly reactive to light. Extraocular movements full without nystagmus. Visual fields full show dense left homonymous hemianopsia to confrontation. Hearing intact. Facial sensation intact.  Mild left lower facial weakness., tongue, palate moves normally and symmetrically.  Motor: Spastic left hemiplegia with nonfixed flexion contractures of left elbow and fingers and left foot drop.  Left upper extremity only has shoulder movements 2/5 in elbow and hand of 0/5.  Left lower extremity is 2-3/5 proximally at the hip and knees with left ankle foot drop and 0/5 dorsiflexor strength.  Tone is increased on the left side with spasticity.   Sensory.: intact to touch ,pinprick .position and vibratory sensation.  Coordination: Rapid alternating movements normal in all extremities. Finger-to-nose and heel-to-shin  performed accurately bilaterally. Gait and Station: Deferred as patient is in a wheelchair and did not bring his quad cane. Reflexes: 1+ and symmetric. Toes downgoing.   NIHSS  9        Modified Rankin 3  ASSESSMENT: 45 year old African-American male with large right hemispheric infarct due to right internal carotid and middle cerebral artery occlusion with cytotoxic edema and brain herniation s/p hemicraniectomy is doing reasonably well with residual left spastic hemiplegia.  Etiology of carotid occlusion unclear possibly dissection.  He had a prolonged hospital admission with several complications including DVT, pulmonary embolism, hemorrhagic transformation, abdominal wall hematoma, UTI but is made quite remarkable improvement.     PLAN: I had a long discussion with the patient and his wife regarding his large right hemispheric infarct with malignant cerebral edema requiring hemicraniectomy with residual spastic hemiplegia and answered questions.  I recommended starting aspirin 81 mg daily for stroke prevention and maintain aggressive risk factor modification strict control of hypertension with blood pressure goal below 130/90, lipids with LDL cholesterol goal below 70 mg percent and diabetes with hemoglobin A1c goal below 6.5%.  He was encouraged to continue to use his quad cane and ambulate and we discussed fall safety precautions.  Check lipid profile and hemoglobin A1c as well as CT angiogram of brain and neck.  Return for follow-up in future in 3 months with my nurse practitioner Janett Billow or call earlier if necessary. Greater than 50% of time during this prolonged 40 minute visit was spent on counseling,explanation of diagnosis, planning of further management, discussion with patient and family and coordination of care.  Greater than 50% time during this prolonged 40-minute follow-up visit was spent on counseling and coordination of care about his large stroke, hemicraniectomy and discussion  about postop spasticity and stroke prevention and answering questions. Matthew Contras, MD Note: This document was prepared with digital dictation and possible smart phrase technology. Any transcriptional errors that result from this process are unintentional

## 2021-01-30 NOTE — Therapy (Signed)
[facility] [address] [place], [place], [address] Phone: [phone]   Fax:  [phone]  Occupational Therapy Treatment  Patient Details  Name: Matthew Black MRN: [id number] Date of Birth: 10-04-1976 Referring Provider (OT): Dr Ranell Patrick   Encounter Date: 01/30/2021   OT End of Session - 01/30/21 1524    Visit Number 6    Number of Visits 9    Date for OT Re-Evaluation 03/04/21    Authorization Type approved 8 visits between 2/2-4/13/22    Authorization - Visit Number 5    Authorization - Number of Visits 9    Progress Note Due on Visit 10    OT Start Time 5027    OT Stop Time 1315    OT Time Calculation (min) 40 min    Activity Tolerance Patient tolerated treatment well    Behavior During Therapy [facility] for tasks assessed/performed           Past Medical History:  Diagnosis Date  . 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 Depression   . DVT (deep venous thrombosis) ([place])    BLE DVT 07/21/19, 08/02/19; s/p retrievable IVC filter 07/21/19  . Hemorrhagic stroke ([place])   . History of kidney stones   . Hypertension   . 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 PE (pulmonary thromboembolism) ([place])    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  . Stroke [facility])    RICA, R A1, R MCA occlusion 07/19/19    Past Surgical History:  Procedure Laterality Date  . CRANIOPLASTY Right 06/05/2020   Procedure: CRANIOPLASTY;  Surgeon: Consuella Lose, MD;  Location: [facility];  Service: Neurosurgery;  Laterality: Right;  right  . CRANIOTOMY Right 07/19/2019   Procedure: RIGHT HEMI-CRANIECTOMY With implantation of skull flap to abdominal wall;  Surgeon: Consuella Lose, MD;  Location: [facility];  Service: Neurosurgery;  Laterality: Right;  . CYST EXCISION N/A 10/08/2016   Procedure: EXCISION OF POSTERIOR NECK CYST;  Surgeon: Clovis Riley, MD;  Location: [facility];  Service: General;  Laterality: N/A;  .  CYSTOSCOPY/URETEROSCOPY/HOLMIUM LASER/STENT PLACEMENT Right 03/16/2020   Procedure: CYSTOSCOPY RIGHT RETROGRADE PYELOGRAM URETEROSCOPY/HOLMIUM LASER/STENT PLACEMENT;  Surgeon: Lucas Mallow, MD;  Location: [facility];  Service: Urology;  Laterality: Right;  . INCISION AND DRAINAGE ABSCESS N/A 09/22/2014   Procedure: INCISION AND DRAINAGE ABSCESS POSTERIOR NECK;  Surgeon: Pedro Earls, MD;  Location[facility];  Service: General;  Laterality: N/A;  . INCISION AND DRAINAGE ABSCESS N/A 12/20/2015   Procedure: INCISION AND DRAINAGE POSTERIOR NECK MASS;  Surgeon: Armandina Gemma, MD;  Location: [facility];  Service: General;  Laterality: N/A;  . INCISION AND DRAINAGE ABSCESS Left 07/10/2004   middle finger  . IR IVC FILTER PLMT / S&I /IMG GUID/MOD SED  07/21/2019  . IR RADIOLOGIST EVAL & MGMT  12/14/2019  . IR RADIOLOGIST EVAL & MGMT  12/21/2020  . IR VENOGRAM RENAL UNI RIGHT  07/21/2019  . MASS EXCISION N/A 07/21/2017   Procedure: EXCISION OF BENIGN NECK LESION WITH LAYERED CLOSURE;  Surgeon: Irene Limbo, MD;  Location: [facility];  Service: Plastics;  Laterality: N/A;  . MASS EXCISION N/A 11/10/2017   Procedure: EXCISION BENIGN LESION OF THE NECK WITH LAYERED CLOSURE;  Surgeon: Irene Limbo, MD;  Location: [facility];  Service: Plastics;  Laterality: N/A;    There were no vitals filed for this visit.   Subjective Assessment - 01/30/21 1238  Subjective  I had to get stuck - they are checking if my blood will clot.  He started me on aspirin    Patient is accompanied by: Family member    Pertinent History CVA s/p hemicaniectomy 07/19/19 with abdominal flap implant.  PMH: hx of seizures and headaches s/p CVA, hx of DVT with IVC filter placement    Limitations fall risk, hx of seizures    Currently in Pain? No/denies    Pain Score 0-No pain                        OT Treatments/Exercises (OP) - 01/30/21 0001      Neurological Re-education Exercises    Other Exercises 1 Neuromuscular reeducation to address midline orientation for sit to stand and stand to sit.  Used sit to stand lift to assist with alignment of LE's and trunk - giving visual target to midline.  Patient able to maintain more upright trunk, and hip flexion for prep to sist to stand - able to manage more midline awareness with reduced physical assistance.  Working to have arm stable while body moving to improve shoulder range of motion.                  OT Education - 01/30/21 1524    Education Details answered patient's questions regarding progress after 9 months    Person(s) Educated Patient    Methods Explanation    Comprehension Verbalized understanding;Need further instruction            OT Short Term Goals - 01/30/21 1525      OT SHORT TERM GOAL #1   Title Patient will safely complete self range of motion exercises with min cueing    Baseline dependent    Time 4    Period Weeks    Status Achieved      OT SHORT TERM GOAL #2   Title Patient will put on pull over shirt with set up assistance    Baseline mod assist    Time 4    Period Weeks    Status On-going      OT SHORT TERM GOAL #3   Title Patient will understand positioning for left shoulder for pain relief in bed, and will be able to direct caregiver    Baseline dependent    Time 4    Period Weeks    Status Achieved             OT Long Term Goals - 01/30/21 1525      OT LONG TERM GOAL #1   Title Pt/wife will be independent with updated HEP.    Baseline not yet established    Time 8    Period Weeks    Status On-going      OT LONG TERM GOAL #2   Title Patient will don pull over shirt with modified independence    Baseline mod assist    Time 8    Period Weeks    Status On-going      OT LONG TERM GOAL #3   Title Pt will demo at least 120* L shoulder flex PROM without pain for incr ease with ADLs    Baseline 95    Time 8    Period Weeks    Status On-going      OT LONG TERM  GOAL #4   Title Patient will demonstrate at least 30 degrees of passive external rotation in left shoulder  Baseline 0    Time 8    Period Weeks    Status On-going      OT LONG TERM GOAL #5   Title xxx    Baseline xxx                 Plan - 01/30/21 1525    Clinical Impression Statement Pt reports shoulder pain remains low    OT Occupational Profile and History Detailed Assessment- Review of Records and additional review of physical, cognitive, psychosocial history related to current functional performance    Occupational performance deficits (Please refer to evaluation for details): ADL's;IADL's;Work;Rest and Sleep    Body Structure / Function / Physical Skills ADL;Dexterity;Balance;ROM;IADL;Mobility;Sensation;Strength;Tone;FMC;Coordination;Decreased knowledge of precautions;Decreased knowledge of use of DME;GMC;Pain;UE functional use;Flexibility;Improper spinal/pelvic alignment;Body mechanics;Endurance;Proprioception    Cognitive Skills Attention;Emotional;Energy/Drive    Rehab Potential Good    Clinical Decision Making Several treatment options, min-mod task modification necessary    Comorbidities Affecting Occupational Performance: Presence of comorbidities impacting occupational performance    Comorbidities impacting occupational performance description: history of seizures, spasticity    Modification or Assistance to Complete Evaluation  Min-Moderate modification of tasks or assist with assess necessary to complete eval    OT Frequency 1x / week    OT Duration 8 weeks    OT Treatment/Interventions Self-care/ADL training;DME and/or AE instruction;Fluidtherapy;Moist Heat;Splinting;Balance training;Therapeutic activities;Aquatic Therapy;Ultrasound;Therapeutic exercise;Cognitive remediation/compensation;Passive range of motion;Functional Mobility Training;Neuromuscular education;Cryotherapy;Manual Therapy;Patient/family education;Visual/perceptual remediation/compensation;Energy  conservation;Electrical Stimulation    Plan Dressing - socks, shoes, shirt, sleep positions    OT Home Exercise Plan table slides    Consulted and Agree with Plan of Care Patient           Patient will benefit from skilled therapeutic intervention in order to improve the following deficits and impairments:   Body Structure / Function / Physical Skills: ADL,Dexterity,Balance,ROM,IADL,Mobility,Sensation,Strength,Tone,FMC,Coordination,Decreased knowledge of precautions,Decreased knowledge of use of DME,GMC,Pain,UE functional use,Flexibility,Improper spinal/pelvic alignment,Body mechanics,Endurance,Proprioception Cognitive Skills: Attention,Emotional,Energy/Drive     Visit Diagnosis: 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 S/P craniotomy 06/05/2020  . History of cranioplasty 06/05/2020  . Peri-rectal abscess 02/14/2020  . Abnormal CT scan, pelvis 02/14/2020  . Pancytopenia ([place]) 12/02/2019  . Nephrolithiasis 12/02/2019  . Hydronephrosis with renal and ureteral calculus obstruction 12/02/2019  . Rectal pain 12/02/2019  . Hyperkalemia 12/02/2019  . Reactive depression   . Wound infection after surgery   . Sleep disturbance   . Dysphagia, post-stroke   . Transaminitis   . Right middle cerebral artery stroke ([place]) 09/06/2019  . Cerebral abscess   . Urinary tract infection without hematuria   . Altered mental status   . Primary hypercoagulable state ([place])   . Acute pulmonary embolism without acute cor pulmonale (HCC)   . Deep vein thrombosis (DVT) of non-extremity vein   . Hypokalemia   . Acute blood loss anemia   .  Leukocytosis   . Endotracheal tube present   . Acute respiratory failure with hypoxemia ([place])   . Stroke (cerebrum) ([place]) 07/19/2019  . Pressure injury of skin 07/19/2019  . Acute CVA (cerebrovascular accident) ([place])   . Encephalopathy   . Dysphagia   . Acute encephalopathy   . Essential  hypertension   . Obesity 03/12/2016  . Scalp abscess 12/20/2015  . Neck abscess 12/20/2015  . Pilonidal cyst 02/08/2013    Mariah Milling, OTR/L 01/30/2021, 3:26 PM  [facility] [address] [place], [place], [address] Phone: [phone]   Fax:  [phone]  Name: Matthew Black MRN: [id number] Date of Birth: 01/13/1976

## 2021-01-31 ENCOUNTER — Telehealth: Payer: Self-pay | Admitting: Neurology

## 2021-01-31 LAB — LIPID PANEL
Chol/HDL Ratio: 3.7 ratio (ref 0.0–5.0)
Cholesterol, Total: 128 mg/dL (ref 100–199)
HDL: 35 mg/dL — ABNORMAL LOW (ref >39)
LDL Chol Calc (NIH): 78 mg/dL (ref 0–99)
Triglycerides: 76 mg/dL (ref 0–149)
VLDL Cholesterol Cal: 15 mg/dL (ref 5–40)

## 2021-01-31 LAB — HEMOGLOBIN A1C
Est. average glucose Bld gHb Est-mCnc: 114 mg/dL
Hgb A1c MFr Bld: 5.6 % (ref 4.8–5.6)

## 2021-01-31 NOTE — Progress Notes (Signed)
Kindly inform the patient that cholesterol profile is borderline but acceptable.  Screening test for diabetes was satisfactory.

## 2021-01-31 NOTE — Telephone Encounter (Signed)
mcd healthy blue pending can take up to 15 business days  

## 2021-02-04 NOTE — Telephone Encounter (Signed)
Checked the status on the portal it ist still pending.

## 2021-02-05 ENCOUNTER — Telehealth: Payer: Self-pay

## 2021-02-05 NOTE — Telephone Encounter (Signed)
-----   Message from Garvin Fila, MD sent at 01/31/2021  4:50 PM EST ----- Kindly inform the patient that cholesterol profile is borderline but acceptable.  Screening test for diabetes was satisfactory.

## 2021-02-05 NOTE — Telephone Encounter (Signed)
I called patient.  No answer, VM full. Will try again later.

## 2021-02-06 ENCOUNTER — Other Ambulatory Visit: Payer: Self-pay

## 2021-02-06 ENCOUNTER — Ambulatory Visit: Payer: Medicaid Other | Admitting: Occupational Therapy

## 2021-02-06 DIAGNOSIS — M6281 Muscle weakness (generalized): Secondary | ICD-10-CM

## 2021-02-06 DIAGNOSIS — I69354 Hemiplegia and hemiparesis following cerebral infarction affecting left non-dominant side: Secondary | ICD-10-CM

## 2021-02-06 DIAGNOSIS — M25612 Stiffness of left shoulder, not elsewhere classified: Secondary | ICD-10-CM

## 2021-02-06 DIAGNOSIS — R208 Other disturbances of skin sensation: Secondary | ICD-10-CM

## 2021-02-06 DIAGNOSIS — G8929 Other chronic pain: Secondary | ICD-10-CM

## 2021-02-06 DIAGNOSIS — R293 Abnormal posture: Secondary | ICD-10-CM

## 2021-02-06 DIAGNOSIS — M25512 Pain in left shoulder: Secondary | ICD-10-CM

## 2021-02-06 DIAGNOSIS — R2681 Unsteadiness on feet: Secondary | ICD-10-CM

## 2021-02-06 DIAGNOSIS — R41840 Attention and concentration deficit: Secondary | ICD-10-CM

## 2021-02-06 NOTE — Therapy (Signed)
[facility] [address] [place], [place], [address] Phone: [phone]   Fax:  [phone]  Occupational Therapy Treatment  Patient Details  Name: Matthew Black MRN: [id number] Date of Birth: 11/01/1976 Referring Provider (OT): Dr Ranell Patrick   Encounter Date: 02/06/2021   OT End of Session - 02/06/21 1430    Visit Number 7    Number of Visits 9    Date for OT Re-Evaluation 03/04/21    Authorization Type approved 8 visits between 2/2-4/13/22    Authorization - Visit Number 6    Authorization - Number of Visits 9    Progress Note Due on Visit 10    OT Start Time 1314    OT Stop Time 1400    OT Time Calculation (min) 46 min    Activity Tolerance Patient tolerated treatment well    Behavior During Therapy [facility] for tasks assessed/performed           Past Medical History:  Diagnosis Date  . 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 Depression   . DVT (deep venous thrombosis) ([place])    BLE DVT 07/21/19, 08/02/19; s/p retrievable IVC filter 07/21/19  . Hemorrhagic stroke ([place])   . History of kidney stones   . Hypertension   . Paralysis ([place])    LEFT SIDE  . PE (pulmonary thromboembolism) ([place])    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  . Stroke [facility])    RICA, R A1, R MCA occlusion 07/19/19    Past Surgical History:  Procedure Laterality Date  . CRANIOPLASTY Right 06/05/2020   Procedure: CRANIOPLASTY;  Surgeon: Consuella Lose, MD;  Location: [place];  Service: Neurosurgery;  Laterality: Right;  right  . CRANIOTOMY Right 07/19/2019   Procedure: RIGHT HEMI-CRANIECTOMY With implantation of skull flap to abdominal wall;  Surgeon: Consuella Lose, MD;  Location: [place];  Service: Neurosurgery;  Laterality: Right;  . CYST EXCISION N/A 10/08/2016   Procedure: EXCISION OF POSTERIOR NECK CYST;  Surgeon: Clovis Riley, MD;  [facility];  Service: General;  Laterality: N/A;  .  CYSTOSCOPY/URETEROSCOPY/HOLMIUM LASER/STENT PLACEMENT Right 03/16/2020   Procedure: CYSTOSCOPY RIGHT RETROGRADE PYELOGRAM URETEROSCOPY/HOLMIUM LASER/STENT PLACEMENT;  Surgeon: Lucas Mallow, MD;  [facility];  Service: Urology;  Laterality: Right;  . INCISION AND DRAINAGE ABSCESS N/A 09/22/2014   Procedure: INCISION AND DRAINAGE ABSCESS POSTERIOR NECK;  Surgeon: Pedro Earls, MD;  [facility];  Service: General;  Laterality: N/A;  . INCISION AND DRAINAGE ABSCESS N/A 12/20/2015   Procedure: INCISION AND DRAINAGE POSTERIOR NECK MASS;  Surgeon: Armandina Gemma, MD;  [facility];  Service: General;  Laterality: N/A;  . INCISION AND DRAINAGE ABSCESS Left 07/10/2004   middle finger  . IR IVC FILTER PLMT / S&I /IMG GUID/MOD SED  07/21/2019  . IR RADIOLOGIST EVAL & MGMT  12/14/2019  . IR RADIOLOGIST EVAL & MGMT  12/21/2020  . IR VENOGRAM RENAL UNI RIGHT  07/21/2019  . MASS EXCISION N/A 07/21/2017   Procedure: EXCISION OF BENIGN NECK LESION WITH LAYERED CLOSURE;  Surgeon: Irene Limbo, MD;  Location: [place];  Service: Plastics;  Laterality: N/A;  . MASS EXCISION N/A 11/10/2017   Procedure: EXCISION BENIGN LESION OF THE NECK WITH LAYERED CLOSURE;  Surgeon: Irene Limbo, MD;  Location: [place];  Service: Plastics;  Laterality: N/A;    There were no vitals filed for this visit.  OT Treatments/Exercises (OP) - 02/06/21 0001      ADLs   UB Dressing Patient has short term goal of donning pull over shirt with set up assistance.  Practiced putting on pull over shirt.  Did timed task and broke activity into smaller components.  Patient did well with this emphasis.  With rote practice able to don shirt in under 2 minutes.  Patient pleased, and will continue to practice at home.      Neurological Re-education Exercises   Other Exercises 1 Neuromuscular reeducation to address body on arm movement.  In unsupported sitting,  with UE's propped on bedside table at shoulder height - worked on trunk flexion and extension to increase range avaialble at left shoulder and elbow.  Patient needed cueing to rotate pelvis posteriorly. Then followed with active elbow flexion toward face and relaxation of flexion to extend elbow with support.                  OT Education - 02/06/21 1430    Education Details Patient to practice donning shirt    Person(s) Educated Patient    Methods Explanation;Demonstration;Tactile cues;Verbal cues    Comprehension Verbalized understanding;Returned demonstration;Verbal cues required;Tactile cues required;Need further instruction            OT Short Term Goals - 02/06/21 1319      OT SHORT TERM GOAL #1   Title Patient will safely complete self range of motion exercises with min cueing    Baseline dependent    Time 4    Period Weeks    Status Achieved    Target Date 02/02/21      OT SHORT TERM GOAL #2   Title Patient will put on pull over shirt with set up assistance    Baseline mod assist    Time 4    Period Weeks    Status Achieved      OT SHORT TERM GOAL #3   Title Patient will understand positioning for left shoulder for pain relief in bed, and will be able to direct caregiver    Baseline dependent    Time 4    Period Weeks    Status Achieved             OT Long Term Goals - 02/06/21 1431      OT LONG TERM GOAL #1   Title Pt/wife will be independent with updated HEP.    Baseline not yet established    Time 8    Period Weeks    Status On-going      OT LONG TERM GOAL #2   Title Patient will don pull over shirt with modified independence    Baseline mod assist    Time 8    Period Weeks    Status On-going      OT LONG TERM GOAL #3   Title Pt will demo at least 120* L shoulder flex PROM without pain for incr ease with ADLs    Baseline 95    Time 8    Period Weeks    Status On-going      OT LONG TERM GOAL #4   Title Patient will demonstrate at  least 30 degrees of passive external rotation in left shoulder    Baseline 0    Time 8    Period Weeks    Status On-going      OT LONG TERM GOAL #5   Title xxx    Baseline xxx  Plan - 02/06/21 1431    Clinical Impression Statement Pt pleased with progress in UE dressing skills    OT Occupational Profile and History Detailed Assessment- Review of Records and additional review of physical, cognitive, psychosocial history related to current functional performance    Occupational performance deficits (Please refer to evaluation for details): ADL's;IADL's;Work;Rest and Sleep    Body Structure / Function / Physical Skills ADL;Dexterity;Balance;ROM;IADL;Mobility;Sensation;Strength;Tone;FMC;Coordination;Decreased knowledge of precautions;Decreased knowledge of use of DME;GMC;Pain;UE functional use;Flexibility;Improper spinal/pelvic alignment;Body mechanics;Endurance;Proprioception    Cognitive Skills Attention;Emotional;Energy/Drive    Rehab Potential Good    Clinical Decision Making Several treatment options, min-mod task modification necessary    Comorbidities Affecting Occupational Performance: Presence of comorbidities impacting occupational performance    Comorbidities impacting occupational performance description: history of seizures, spasticity    Modification or Assistance to Complete Evaluation  Min-Moderate modification of tasks or assist with assess necessary to complete eval    OT Frequency 1x / week    OT Duration 8 weeks    OT Treatment/Interventions Self-care/ADL training;DME and/or AE instruction;Fluidtherapy;Moist Heat;Splinting;Balance training;Therapeutic activities;Aquatic Therapy;Ultrasound;Therapeutic exercise;Cognitive remediation/compensation;Passive range of motion;Functional Mobility Training;Neuromuscular education;Cryotherapy;Manual Therapy;Patient/family education;Visual/perceptual remediation/compensation;Energy conservation;Electrical Stimulation     Plan Dressing - socks, shoes, shirt, sleep positions    OT Home Exercise Plan table slides    Consulted and Agree with Plan of Care Patient           Patient will benefit from skilled therapeutic intervention in order to improve the following deficits and impairments:   Body Structure / Function / Physical Skills: ADL,Dexterity,Balance,ROM,IADL,Mobility,Sensation,Strength,Tone,FMC,Coordination,Decreased knowledge of precautions,Decreased knowledge of use of DME,GMC,Pain,UE functional use,Flexibility,Improper spinal/pelvic alignment,Body mechanics,Endurance,Proprioception Cognitive Skills: Attention,Emotional,Energy/Drive     Visit Diagnosis: 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 S/P craniotomy 06/05/2020  . History of cranioplasty 06/05/2020  . Peri-rectal abscess 02/14/2020  . Abnormal CT scan, pelvis 02/14/2020  . Pancytopenia ([place]) 12/02/2019  . Nephrolithiasis 12/02/2019  . Hydronephrosis with renal and ureteral calculus obstruction 12/02/2019  . Rectal pain 12/02/2019  . Hyperkalemia 12/02/2019  . Reactive depression   . Wound infection after surgery   . Sleep disturbance   . Dysphagia, post-stroke   . Transaminitis   . 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 Cerebral abscess   . Urinary tract infection without hematuria   . Altered mental status   . Primary hypercoagulable state ([place])   . Acute pulmonary embolism without acute cor pulmonale (HCC)   . Deep vein thrombosis (DVT) of non-extremity vein   . Hypokalemia   . Acute blood loss anemia   . Leukocytosis   . Endotracheal tube present    . Acute respiratory failure with hypoxemia ([place])   . Stroke (cerebrum) ([place]) 07/19/2019  . Pressure injury of skin 07/19/2019  . Acute CVA (cerebrovascular accident) ([place])   . Encephalopathy   . Dysphagia   . Acute encephalopathy   . Essential hypertension   . Obesity 03/12/2016  . Scalp abscess 12/20/2015  . Neck abscess 12/20/2015  . Pilonidal cyst 02/08/2013    Mariah Milling, OTR/L 02/06/2021, 2:32 PM  [facility] [address] [place], [place], [address] Phone: [phone]  Fax:  [phone]  Name: NOCHOLAS DAMASO MRN: [id number] Date of Birth: 12/13/1975

## 2021-02-11 ENCOUNTER — Other Ambulatory Visit: Payer: Self-pay | Admitting: Physical Medicine and Rehabilitation

## 2021-02-11 DIAGNOSIS — I639 Cerebral infarction, unspecified: Secondary | ICD-10-CM

## 2021-02-11 NOTE — Telephone Encounter (Signed)
I called patient again to discuss lab results.  No answer, voicemail full.  We will try again later.

## 2021-02-12 NOTE — Telephone Encounter (Signed)
I called patient.  I discussed his lab results and recommendations.  Patient will follow-up with [place] as scheduled in June.  Patient verbalized understanding of results and had no further questions or concerns at this time.

## 2021-02-12 NOTE — Telephone Encounter (Signed)
mcd healthy blue Matthew Black: [id number] (exp. 01/31/21 to 03/02/21) order sent to GI. They will reach out to the patient to schedule.

## 2021-02-13 ENCOUNTER — Encounter: Payer: Self-pay | Admitting: Occupational Therapy

## 2021-02-13 ENCOUNTER — Ambulatory Visit: Payer: Medicaid Other | Admitting: Occupational Therapy

## 2021-02-13 ENCOUNTER — Other Ambulatory Visit: Payer: Self-pay

## 2021-02-13 DIAGNOSIS — I69354 Hemiplegia and hemiparesis following cerebral infarction affecting left non-dominant side: Secondary | ICD-10-CM

## 2021-02-13 DIAGNOSIS — R2681 Unsteadiness on feet: Secondary | ICD-10-CM

## 2021-02-13 DIAGNOSIS — M25512 Pain in left shoulder: Secondary | ICD-10-CM

## 2021-02-13 DIAGNOSIS — R208 Other disturbances of skin sensation: Secondary | ICD-10-CM

## 2021-02-13 DIAGNOSIS — R293 Abnormal posture: Secondary | ICD-10-CM

## 2021-02-13 DIAGNOSIS — G8929 Other chronic pain: Secondary | ICD-10-CM

## 2021-02-13 DIAGNOSIS — M6281 Muscle weakness (generalized): Secondary | ICD-10-CM

## 2021-02-13 DIAGNOSIS — M25612 Stiffness of left shoulder, not elsewhere classified: Secondary | ICD-10-CM

## 2021-02-13 DIAGNOSIS — R41840 Attention and concentration deficit: Secondary | ICD-10-CM

## 2021-02-13 NOTE — Therapy (Signed)
[facility] [address] [place], [place], [address] Phone: [phone]   Fax:  [phone]  Occupational Therapy Treatment  Patient Details  Name: Matthew Black MRN: [id number] Date of Birth: December 20, 1975 Referring Provider (OT): Dr Ranell Patrick   Encounter Date: 02/13/2021   OT End of Session - 02/13/21 1400    Visit Number 8    Number of Visits 9    Date for OT Re-Evaluation 03/04/21    Authorization Type approved 8 visits between 2/2-4/13/22    Authorization - Visit Number 7    Authorization - Number of Visits 9    Progress Note Due on Visit 10    OT Start Time 1305    OT Stop Time 1352    OT Time Calculation (min) 47 min    Activity Tolerance Patient tolerated treatment well    Behavior During Therapy [facility] for tasks assessed/performed           Past Medical History:  Diagnosis Date  . 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 Depression   . DVT (deep venous thrombosis) ([place])    BLE DVT 07/21/19, 08/02/19; s/p retrievable IVC filter 07/21/19  . Hemorrhagic stroke ([place])   . History of kidney stones   . Hypertension   . 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 PE (pulmonary thromboembolism) ([place])    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  . Stroke [facility])    RICA, R A1, R MCA occlusion 07/19/19    Past Surgical History:  Procedure Laterality Date  . CRANIOPLASTY Right 06/05/2020   Procedure: CRANIOPLASTY;  Surgeon: Consuella Lose, MD;  Location: [place];  Service: Neurosurgery;  Laterality: Right;  right  . CRANIOTOMY Right 07/19/2019   Procedure: RIGHT HEMI-CRANIECTOMY With implantation of skull flap to abdominal wall;  Surgeon: Consuella Lose, MD;  Location: [place];  Service: Neurosurgery;  Laterality: Right;  . CYST EXCISION N/A 10/08/2016   Procedure: EXCISION OF POSTERIOR NECK CYST;  Surgeon: Clovis Riley, MD;  [facility];  Service: General;  Laterality: N/A;  .  CYSTOSCOPY/URETEROSCOPY/HOLMIUM LASER/STENT PLACEMENT Right 03/16/2020   Procedure: CYSTOSCOPY RIGHT RETROGRADE PYELOGRAM URETEROSCOPY/HOLMIUM LASER/STENT PLACEMENT;  Surgeon: Lucas Mallow, MD;  [facility];  Service: Urology;  Laterality: Right;  . INCISION AND DRAINAGE ABSCESS N/A 09/22/2014   Procedure: INCISION AND DRAINAGE ABSCESS POSTERIOR NECK;  Surgeon: Pedro Earls, MD;  [facility];  Service: General;  Laterality: N/A;  . INCISION AND DRAINAGE ABSCESS N/A 12/20/2015   Procedure: INCISION AND DRAINAGE POSTERIOR NECK MASS;  Surgeon: Armandina Gemma, MD;  [facility];  Service: General;  Laterality: N/A;  . INCISION AND DRAINAGE ABSCESS Left 07/10/2004   middle finger  . IR IVC FILTER PLMT / S&I /IMG GUID/MOD SED  07/21/2019  . IR RADIOLOGIST EVAL & MGMT  12/14/2019  . IR RADIOLOGIST EVAL & MGMT  12/21/2020  . IR VENOGRAM RENAL UNI RIGHT  07/21/2019  . MASS EXCISION N/A 07/21/2017   Procedure: EXCISION OF BENIGN NECK LESION WITH LAYERED CLOSURE;  Surgeon: Irene Limbo, MD;  Location: [place];  Service: Plastics;  Laterality: N/A;  . MASS EXCISION N/A 11/10/2017   Procedure: EXCISION BENIGN LESION OF THE NECK WITH LAYERED CLOSURE;  Surgeon: Irene Limbo, MD;  Location: [place];  Service: Plastics;  Laterality: N/A;    There were no vitals filed for this visit.   Subjective Assessment - 02/13/21 1307  Subjective  I had bars installed on my deck and now I can grill!    Patient is accompanied by: Family member    Pertinent History CVA s/p hemicaniectomy 07/19/19 with abdominal flap implant.  PMH: hx of seizures and headaches s/p CVA, hx of DVT with IVC filter placement    Limitations fall risk, hx of seizures    Patient Stated Goals move/use LUE    Currently in Pain? No/denies    Pain Score 0-No pain                        OT Treatments/Exercises (OP) - 02/13/21 0001      ADLs   UB Dressing Patient reports  that he was able to don pull over shirt at home while seated at edge of bed.  Patient was not able to get shirt on from chair - "I got hung up!"    Functional Mobility Continuing to work on midline orientation with sit to stand and stand to sit.  In standing patient better able to shift weight toward left side in segments.  Difficulty weight shifting hips and trunk simultaneously to left.  Patient does better moving toward target on left versus being pulled to left.  Patient able to sustain left hand on rail while transitioning from stand to sit to promote increased shoulder flexion.      Neurological Re-education Exercises   Other Exercises 1 Patient indicates he has been going to the gym with his broither in law.  Patient reports working on chest and shoulders.  When asked to demonstrate on universal gym, patient unable to safely move LUE - not yet able to accept resistance for LUE - reports brother in law helps.  Feel going to gym is a good activity for him emotionally, and feel he can benefit from being more active, but discouraged resistance training for LUE.                    OT Short Term Goals - 02/13/21 1403      OT SHORT TERM GOAL #1   Title Patient will safely complete self range of motion exercises with min cueing    Baseline dependent    Time 4    Period Weeks    Status Achieved    Target Date 02/02/21      OT SHORT TERM GOAL #2   Title Patient will put on pull over shirt with set up assistance    Baseline mod assist    Time 4    Period Weeks    Status Achieved      OT SHORT TERM GOAL #3   Title Patient will understand positioning for left shoulder for pain relief in bed, and will be able to direct caregiver    Baseline dependent    Time 4    Period Weeks    Status Achieved             OT Long Term Goals - 02/13/21 1403      OT LONG TERM GOAL #1   Title Pt/wife will be independent with updated HEP.    Baseline not yet established    Time 8    Period  Weeks    Status On-going      OT LONG TERM GOAL #2   Title Patient will don pull over shirt with modified independence    Baseline mod assist    Time 8    Period Weeks  Status On-going      OT LONG TERM GOAL #3   Title Pt will demo at least 120* L shoulder flex PROM without pain for incr ease with ADLs    Baseline 95    Time 8    Period Weeks    Status Achieved      OT LONG TERM GOAL #4   Title Patient will demonstrate at least 30 degrees of passive external rotation in left shoulder    Baseline 0    Time 8    Period Weeks    Status Achieved                 Plan - 02/13/21 1401    Clinical Impression Statement Pt with significantly delayed processing at times this session.    OT Occupational Profile and History Detailed Assessment- Review of Records and additional review of physical, cognitive, psychosocial history related to current functional performance    Occupational performance deficits (Please refer to evaluation for details): ADL's;IADL's;Work;Rest and Sleep    Body Structure / Function / Physical Skills ADL;Dexterity;Balance;ROM;IADL;Mobility;Sensation;Strength;Tone;FMC;Coordination;Decreased knowledge of precautions;Decreased knowledge of use of DME;GMC;Pain;UE functional use;Flexibility;Improper spinal/pelvic alignment;Body mechanics;Endurance;Proprioception    Cognitive Skills Attention;Emotional;Energy/Drive    Rehab Potential Good    Clinical Decision Making Several treatment options, min-mod task modification necessary    Comorbidities Affecting Occupational Performance: Presence of comorbidities impacting occupational performance    Comorbidities impacting occupational performance description: history of seizures, spasticity    Modification or Assistance to Complete Evaluation  Min-Moderate modification of tasks or assist with assess necessary to complete eval    OT Frequency 1x / week    OT Duration 8 weeks    OT Treatment/Interventions Self-care/ADL  training;DME and/or AE instruction;Fluidtherapy;Moist Heat;Splinting;Balance training;Therapeutic activities;Aquatic Therapy;Ultrasound;Therapeutic exercise;Cognitive remediation/compensation;Passive range of motion;Functional Mobility Training;Neuromuscular education;Cryotherapy;Manual Therapy;Patient/family education;Visual/perceptual remediation/compensation;Energy conservation;Electrical Stimulation    Plan Dressing - socks, shoes, shirt, sleep positions    OT Home Exercise Plan table slides    Consulted and Agree with Plan of Care Patient           Patient will benefit from skilled therapeutic intervention in order to improve the following deficits and impairments:   Body Structure / Function / Physical Skills: ADL,Dexterity,Balance,ROM,IADL,Mobility,Sensation,Strength,Tone,FMC,Coordination,Decreased knowledge of precautions,Decreased knowledge of use of DME,GMC,Pain,UE functional use,Flexibility,Improper spinal/pelvic alignment,Body mechanics,Endurance,Proprioception Cognitive Skills: Attention,Emotional,Energy/Drive     Visit Diagnosis: 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 S/P craniotomy 06/05/2020  . History of cranioplasty 06/05/2020  . Peri-rectal abscess 02/14/2020  . Abnormal CT scan, pelvis 02/14/2020  . Pancytopenia ([place]) 12/02/2019  . Nephrolithiasis 12/02/2019  . Hydronephrosis with renal and ureteral calculus obstruction 12/02/2019  . Rectal pain 12/02/2019  . Hyperkalemia 12/02/2019  . Reactive depression   . Wound infection after surgery   . Sleep disturbance   . Dysphagia, post-stroke   . Transaminitis    . 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 Cerebral abscess   . Urinary tract infection without hematuria   . Altered mental status   . Primary hypercoagulable state ([place])   . Acute pulmonary embolism without acute cor pulmonale (HCC)   . Deep vein thrombosis (DVT) of non-extremity vein   .  Hypokalemia   . Acute blood loss anemia   . Leukocytosis   . Endotracheal tube present   . Acute respiratory failure with hypoxemia ([place])   . Stroke (cerebrum) ([place]) 07/19/2019  . Pressure injury of skin 07/19/2019  . Acute CVA (cerebrovascular accident) ([place])   . Encephalopathy   . Dysphagia   . Acute encephalopathy   . Essential hypertension   . Obesity 03/12/2016  . Scalp abscess 12/20/2015  . Neck abscess 12/20/2015  . Pilonidal cyst 02/08/2013    Mariah Milling, OTR/L 02/13/2021, 2:04 PM  [facility] [address] [place], [place], [address] Phone: [phone]   Fax:  [phone]  Name: Matthew Black MRN: [id number] Date of Birth: February 09, 1976

## 2021-02-20 ENCOUNTER — Ambulatory Visit: Payer: Medicaid Other | Admitting: Occupational Therapy

## 2021-02-20 ENCOUNTER — Other Ambulatory Visit: Payer: Self-pay

## 2021-02-20 ENCOUNTER — Encounter: Payer: Self-pay | Admitting: Occupational Therapy

## 2021-02-20 DIAGNOSIS — R293 Abnormal posture: Secondary | ICD-10-CM

## 2021-02-20 DIAGNOSIS — I69354 Hemiplegia and hemiparesis following cerebral infarction affecting left non-dominant side: Secondary | ICD-10-CM

## 2021-02-20 DIAGNOSIS — R208 Other disturbances of skin sensation: Secondary | ICD-10-CM

## 2021-02-20 DIAGNOSIS — M25512 Pain in left shoulder: Secondary | ICD-10-CM

## 2021-02-20 DIAGNOSIS — M6281 Muscle weakness (generalized): Secondary | ICD-10-CM | POA: Diagnosis not present

## 2021-02-20 DIAGNOSIS — G8929 Other chronic pain: Secondary | ICD-10-CM

## 2021-02-20 DIAGNOSIS — M25612 Stiffness of left shoulder, not elsewhere classified: Secondary | ICD-10-CM

## 2021-02-20 DIAGNOSIS — R41840 Attention and concentration deficit: Secondary | ICD-10-CM

## 2021-02-20 DIAGNOSIS — R2681 Unsteadiness on feet: Secondary | ICD-10-CM

## 2021-02-20 NOTE — Therapy (Signed)
[facility] [address] [place], [place], [address] Phone: [phone]   Fax:  [phone]  Occupational Therapy Treatment  Patient Details  Name: Matthew Black MRN: [id number] Date of Birth: 1976-02-13 Referring Provider (OT): Dr Ranell Patrick   Encounter Date: 02/20/2021   OT End of Session - 02/20/21 1403    Visit Number 8    Number of Visits 9    Date for OT Re-Evaluation 03/04/21    Authorization Type approved 8 visits between 2/2-4/13/22    Authorization - Visit Number 8    Authorization - Number of Visits 9    Progress Note Due on Visit 10    OT Start Time 1315    OT Stop Time 1400    OT Time Calculation (min) 45 min    Activity Tolerance Patient tolerated treatment well    Behavior During Therapy [facility] for tasks assessed/performed           Past Medical History:  Diagnosis Date  . 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 Depression   . DVT (deep venous thrombosis) ([place])    BLE DVT 07/21/19, 08/02/19; s/p retrievable IVC filter 07/21/19  . Hemorrhagic stroke ([place])   . History of kidney stones   . Hypertension   . Paralysis ()    LEFT SIDE  . PE (pulmonary thromboembolism) ([place])    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  . Stroke [facility])    RICA, R A1, R MCA occlusion 07/19/19    Past Surgical History:  Procedure Laterality Date  . CRANIOPLASTY Right 06/05/2020   Procedure: CRANIOPLASTY;  Surgeon: Consuella Lose, MD;  Location: [facility];  Service: Neurosurgery;  Laterality: Right;  right  . CRANIOTOMY Right 07/19/2019   Procedure: RIGHT HEMI-CRANIECTOMY With implantation of skull flap to abdominal wall;  Surgeon: Consuella Lose, MD;  Location: [facility];  Service: Neurosurgery;  Laterality: Right;  . CYST EXCISION N/A 10/08/2016   Procedure: EXCISION OF POSTERIOR NECK CYST;  Surgeon: Clovis Riley, MD;  Location: [facility];  Service: General;  Laterality: N/A;  .  CYSTOSCOPY/URETEROSCOPY/HOLMIUM LASER/STENT PLACEMENT Right 03/16/2020   Procedure: CYSTOSCOPY RIGHT RETROGRADE PYELOGRAM URETEROSCOPY/HOLMIUM LASER/STENT PLACEMENT;  Surgeon: Lucas Mallow, MD;  [facility];  Service: Urology;  Laterality: Right;  . INCISION AND DRAINAGE ABSCESS N/A 09/22/2014   Procedure: INCISION AND DRAINAGE ABSCESS POSTERIOR NECK;  Surgeon: Pedro Earls, MD;  [facility];  Service: General;  Laterality: N/A;  . INCISION AND DRAINAGE ABSCESS N/A 12/20/2015   Procedure: INCISION AND DRAINAGE POSTERIOR NECK MASS;  Surgeon: Armandina Gemma, MD;  [facility];  Service: General;  Laterality: N/A;  . INCISION AND DRAINAGE ABSCESS Left 07/10/2004   middle finger  . IR IVC FILTER PLMT / S&I /IMG GUID/MOD SED  07/21/2019  . IR RADIOLOGIST EVAL & MGMT  12/14/2019  . IR RADIOLOGIST EVAL & MGMT  12/21/2020  . IR VENOGRAM RENAL UNI RIGHT  07/21/2019  . MASS EXCISION N/A 07/21/2017   Procedure: EXCISION OF BENIGN NECK LESION WITH LAYERED CLOSURE;  Surgeon: Irene Limbo, MD;  Location: [facility];  Service: Plastics;  Laterality: N/A;  . MASS EXCISION N/A 11/10/2017   Procedure: EXCISION BENIGN LESION OF THE NECK WITH LAYERED CLOSURE;  Surgeon: Irene Limbo, MD;  Location: [place];  Service: Plastics;  Laterality: N/A;    There were no vitals filed for this visit.  OT Treatments/Exercises (OP) - 02/20/21 0001      ADLs   LB Dressing Worked on threading pants over feet , by sitting with more of left thigh on bed to allow foot free from floor.  Patient able to consistently catch foot,and get pant leg pulled up to thigh.  Patient able to stand without assistance with weight on BLE's.      Neurological Re-education Exercises   Other Exercises 1 Neuromuscular reeducation to address postural control as related to sit to stand transitions and for rach pattenrs in LUE.                    OT  Short Term Goals - 02/20/21 1503      OT SHORT TERM GOAL #1   Title Patient will safely complete self range of motion exercises with min cueing    Baseline dependent    Time 4    Period Weeks    Status Achieved    Target Date 02/02/21      OT SHORT TERM GOAL #2   Title Patient will put on pull over shirt with set up assistance    Baseline mod assist    Time 4    Period Weeks    Status Achieved      OT SHORT TERM GOAL #3   Title Patient will understand positioning for left shoulder for pain relief in bed, and will be able to direct caregiver    Baseline dependent    Time 4    Period Weeks    Status Achieved             OT Long Term Goals - 02/20/21 1503      OT LONG TERM GOAL #1   Title Pt/wife will be independent with updated HEP.    Baseline not yet established    Time 8    Period Weeks    Status On-going      OT LONG TERM GOAL #2   Title Patient will don pull over shirt with modified independence    Baseline mod assist    Time 8    Period Weeks    Status Achieved      OT LONG TERM GOAL #3   Title Pt will demo at least 120* L shoulder flex PROM without pain for incr ease with ADLs    Baseline 95    Time 8    Period Weeks    Status Achieved      OT LONG TERM GOAL #4   Title Patient will demonstrate at least 30 degrees of passive external rotation in left shoulder    Baseline 0    Time 8    Period Weeks    Status Achieved                 Plan - 02/20/21 1500    Clinical Impression Statement Patient has shown improvement in his ability to dress his upper body- still working to increase participation in lower body dressing, and weight shifting onto left side.    OT Occupational Profile and History Detailed Assessment- Review of Records and additional review of physical, cognitive, psychosocial history related to current functional performance    Occupational performance deficits (Please refer to evaluation for details): ADL's;IADL's;Work;Rest and  Sleep    Body Structure / Function / Physical Skills ADL;Dexterity;Balance;ROM;IADL;Mobility;Sensation;Strength;Tone;FMC;Coordination;Decreased knowledge of precautions;Decreased knowledge of use of DME;GMC;Pain;UE functional use;Flexibility;Improper spinal/pelvic alignment;Body mechanics;Endurance;Proprioception    Cognitive Skills Attention;Emotional;Energy/Drive    Rehab  Potential Good    Clinical Decision Making Several treatment options, min-mod task modification necessary    Comorbidities Affecting Occupational Performance: Presence of comorbidities impacting occupational performance    Comorbidities impacting occupational performance description: history of seizures, spasticity    Modification or Assistance to Complete Evaluation  Min-Moderate modification of tasks or assist with assess necessary to complete eval    OT Frequency 1x / week    OT Duration 8 weeks    OT Treatment/Interventions Self-care/ADL training;DME and/or AE instruction;Fluidtherapy;Moist Heat;Splinting;Balance training;Therapeutic activities;Aquatic Therapy;Ultrasound;Therapeutic exercise;Cognitive remediation/compensation;Passive range of motion;Functional Mobility Training;Neuromuscular education;Cryotherapy;Manual Therapy;Patient/family education;Visual/perceptual remediation/compensation;Energy conservation;Electrical Stimulation    Plan review remaining goals and discharge    OT Home Exercise Plan table slides    Consulted and Agree with Plan of Care Patient           Patient will benefit from skilled therapeutic intervention in order to improve the following deficits and impairments:   Body Structure / Function / Physical Skills: ADL,Dexterity,Balance,ROM,IADL,Mobility,Sensation,Strength,Tone,FMC,Coordination,Decreased knowledge of precautions,Decreased knowledge of use of DME,GMC,Pain,UE functional use,Flexibility,Improper spinal/pelvic alignment,Body mechanics,Endurance,Proprioception Cognitive Skills:  Attention,Emotional,Energy/Drive     Visit Diagnosis: 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 S/P craniotomy 06/05/2020  . History of cranioplasty 06/05/2020  . Peri-rectal abscess 02/14/2020  . Abnormal CT scan, pelvis 02/14/2020  . Pancytopenia ([place]) 12/02/2019  . Nephrolithiasis 12/02/2019  . Hydronephrosis with renal and ureteral calculus obstruction 12/02/2019  . Rectal pain 12/02/2019  . Hyperkalemia 12/02/2019  . Reactive depression   . Wound infection after surgery   . Sleep disturbance   . Dysphagia, post-stroke   . Transaminitis   . 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 Cerebral abscess   . Urinary tract infection without hematuria   . Altered mental status   . Primary hypercoagulable state ([place])   . Acute pulmonary embolism without acute cor pulmonale (HCC)   . Deep vein thrombosis (DVT) of non-extremity vein   . Hypokalemia   . Acute blood loss anemia   . Leukocytosis   . Endotracheal tube present   . Acute respiratory failure with hypoxemia ([place])   . Stroke (cerebrum) ([place]) 07/19/2019  . Pressure injury of skin 07/19/2019  . Acute CVA (cerebrovascular accident) ([place])   . Encephalopathy   . Dysphagia   . Acute encephalopathy   . Essential hypertension   . Obesity 03/12/2016  . Scalp abscess 12/20/2015  . Neck abscess 12/20/2015  . Pilonidal cyst 02/08/2013    Mariah Milling, OTR/L 02/20/2021, 3:04 PM  [facility] [address] [place], [place], [address] Phone: [phone]   Fax:  [phone]  Name: Matthew Black MRN: [id number] Date of Birth: 1976/04/20

## 2021-02-27 ENCOUNTER — Encounter: Payer: Self-pay | Admitting: Occupational Therapy

## 2021-02-27 ENCOUNTER — Other Ambulatory Visit: Payer: Self-pay

## 2021-02-27 ENCOUNTER — Ambulatory Visit: Payer: Medicaid Other | Attending: Physician Assistant | Admitting: Occupational Therapy

## 2021-02-27 DIAGNOSIS — R208 Other disturbances of skin sensation: Secondary | ICD-10-CM | POA: Diagnosis present

## 2021-02-27 DIAGNOSIS — M25512 Pain in left shoulder: Secondary | ICD-10-CM | POA: Insufficient documentation

## 2021-02-27 DIAGNOSIS — M6281 Muscle weakness (generalized): Secondary | ICD-10-CM | POA: Insufficient documentation

## 2021-02-27 DIAGNOSIS — G8929 Other chronic pain: Secondary | ICD-10-CM | POA: Insufficient documentation

## 2021-02-27 DIAGNOSIS — M25612 Stiffness of left shoulder, not elsewhere classified: Secondary | ICD-10-CM | POA: Diagnosis present

## 2021-02-27 DIAGNOSIS — I69354 Hemiplegia and hemiparesis following cerebral infarction affecting left non-dominant side: Secondary | ICD-10-CM | POA: Diagnosis not present

## 2021-02-27 DIAGNOSIS — R41840 Attention and concentration deficit: Secondary | ICD-10-CM | POA: Diagnosis present

## 2021-02-27 NOTE — Therapy (Signed)
[facility] [address] [place], [place], [address] Phone: [phone]   Fax:  [phone]  Occupational Therapy Treatment  Patient Details  Name: Matthew Black MRN: [id number] Date of Birth: 10/15/1976 Referring Provider (OT): Dr Raulkar OCCUPATIONAL THERAPY DISCHARGE SUMMARY    Current functional level related to goals / functional outcomes: Pt made good overall progress, see goals for updates.   Remaining deficits: L hemigplegia, spasticity, decreased balance, cognitive deficits   Education / Equipment: Pt was educated in HEP and ADL strategies, he verbalizes understanding of all education.  Plan: Patient agrees to discharge.  Patient goals were partially met. Patient is being discharged due to meeting the stated rehab goals.  ?????         Encounter Date: 02/27/2021   OT End of Session - 02/27/21 1324    Visit Number 9    Number of Visits 9    Date for OT Re-Evaluation 03/04/21    Authorization Type approved 8 visits between 2/2-4/13/22    Authorization - Visit Number 9    Authorization - Number of Visits 9    OT Start Time 1320    OT Stop Time 1400    OT Time Calculation (min) 40 min    Activity Tolerance Patient tolerated treatment well    Behavior During Therapy [facility] for tasks assessed/performed           Past Medical History:  Diagnosis Date  . 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 Depression   . DVT (deep venous thrombosis) ([facility])    BLE DVT 07/21/19, 08/02/19; s/p retrievable IVC filter 07/21/19  . Hemorrhagic stroke ([facility])   . History of kidney stones   . Hypertension   . Paralysis ([facility])    LEFT SIDE  . PE (pulmonary thromboembolism) ([facility])    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  . Stroke ([facility])    RICA, R A1, R MCA occlusion 07/19/19    Past Surgical History:  Procedure Laterality Date  . CRANIOPLASTY Right 06/05/2020   Procedure: CRANIOPLASTY;  Surgeon:  Nundkumar, Neelesh, MD;  [facility];  Service: Neurosurgery;  Laterality: Right;  right  . CRANIOTOMY Right 07/19/2019   Procedure: RIGHT HEMI-CRANIECTOMY With implantation of skull flap to abdominal wall;  Surgeon: Nundkumar, Neelesh, MD;  [facility];  Service: Neurosurgery;  Laterality: Right;  . CYST EXCISION N/A 10/08/2016   Procedure: EXCISION OF POSTERIOR NECK CYST;  Surgeon: Chelsea A Connor, MD;  [facility];  Service: General;  Laterality: N/A;  . CYSTOSCOPY/URETEROSCOPY/HOLMIUM LASER/STENT PLACEMENT Right 03/16/2020   Procedure: CYSTOSCOPY RIGHT RETROGRADE PYELOGRAM URETEROSCOPY/HOLMIUM LASER/STENT PLACEMENT;  Surgeon: Bell, Eugene D III, MD;  [facility];  Service: Urology;  Laterality: Right;  . INCISION AND DRAINAGE ABSCESS N/A 09/22/2014   Procedure: INCISION AND DRAINAGE ABSCESS POSTERIOR NECK;  Surgeon: Matthew B Martin, MD;  [facility];  Service: General;  Laterality: N/A;  . INCISION AND DRAINAGE ABSCESS N/A 12/20/2015   Procedure: INCISION AND DRAINAGE POSTERIOR NECK MASS;  Surgeon: Todd Gerkin, MD;  [facility];  Service: General;  Laterality: N/A;  . INCISION AND DRAINAGE ABSCESS Left 07/10/2004   middle finger  . IR IVC FILTER PLMT / S&I /IMG GUID/MOD SED  07/21/2019  . IR RADIOLOGIST EVAL & MGMT  12/14/2019  . IR RADIOLOGIST EVAL & MGMT  12/21/2020  . IR VENOGRAM RENAL UNI RIGHT  07/21/2019  . MASS EXCISION N/A 07/21/2017   Procedure: EXCISION OF BENIGN   NECK LESION WITH LAYERED CLOSURE;  Surgeon: Thimmappa, Brinda, MD;  Location: [facility];  Service: Plastics;  Laterality: N/A;  . MASS EXCISION N/A 11/10/2017   Procedure: EXCISION BENIGN LESION OF THE NECK WITH LAYERED CLOSURE;  Surgeon: Thimmappa, Brinda, MD;  Location: [facility];  Service: Plastics;  Laterality: N/A;    There were no vitals filed for this visit.   Subjective Assessment - 02/27/21 1408    Subjective  Pt denies pain    Pertinent History CVA s/p  hemicaniectomy 07/19/19 with abdominal flap implant.  PMH: hx of seizures and headaches s/p CVA, hx of DVT with IVC filter placement    Patient Stated Goals move/use LUE    Currently in Pain? No/denies               Treatment: Reviewed previously issued HEP for table slides, and stretch for floor, pt returned demonstration Seated weight bearing through bilateral UE's, mos facilitation Low range bilateral shoulder flexion pushing noodle along lap with bilateral UE's, min -mod v.c/ facilitation Pt was instructed in self P/ROM supination/ pronation for left forearm, he returned demonstration. Pt transferred to and from mat with min A, stand pivot                   OT Short Term Goals - 02/20/21 1503      OT SHORT TERM GOAL #1   Title Patient will safely complete self range of motion exercises with min cueing    Baseline dependent    Time 4    Period Weeks    Status Achieved    Target Date 02/02/21      OT SHORT TERM GOAL #2   Title Patient will put on pull over shirt with set up assistance    Baseline mod assist    Time 4    Period Weeks    Status Achieved      OT SHORT TERM GOAL #3   Title Patient will understand positioning for left shoulder for pain relief in bed, and will be able to direct caregiver    Baseline dependent    Time 4    Period Weeks    Status Achieved             OT Long Term Goals - 02/27/21 1539      OT LONG TERM GOAL #1   Title Pt/wife will be independent with updated HEP.    Baseline not yet established    Time 8    Period Weeks    Status Partially Met   Pt demonstates understanding of existing HEP and it is still appropriate.     OT LONG TERM GOAL #2   Title Patient will don pull over shirt with modified independence    Baseline mod assist    Time 8    Period Weeks    Status Achieved      OT LONG TERM GOAL #3   Title Pt will demo at least 120* L shoulder flex PROM without pain for incr ease with ADLs    Baseline 95     Time 8    Period Weeks    Status Achieved      OT LONG TERM GOAL #4   Title Patient will demonstrate at least 30 degrees of passive external rotation in left shoulder    Baseline 0    Time 8    Period Weeks    Status Achieved                   Plan - 02/27/21 1537    Clinical Impression Statement Pt demonstrates good overall progress and he agrees with d/c at this time.    OT Occupational Profile and History Detailed Assessment- Review of Records and additional review of physical, cognitive, psychosocial history related to current functional performance    Occupational performance deficits (Please refer to evaluation for details): ADL's;IADL's;Work;Rest and Sleep    Body Structure / Function / Physical Skills ADL;Dexterity;Balance;ROM;IADL;Mobility;Sensation;Strength;Tone;FMC;Coordination;Decreased knowledge of precautions;Decreased knowledge of use of DME;GMC;Pain;UE functional use;Flexibility;Improper spinal/pelvic alignment;Body mechanics;Endurance;Proprioception    Cognitive Skills Attention;Emotional;Energy/Drive    Rehab Potential Good    Clinical Decision Making Several treatment options, min-mod task modification necessary    Comorbidities Affecting Occupational Performance: Presence of comorbidities impacting occupational performance    Comorbidities impacting occupational performance description: history of seizures, spasticity    Modification or Assistance to Complete Evaluation  Min-Moderate modification of tasks or assist with assess necessary to complete eval    OT Frequency 1x / week    OT Duration 8 weeks    OT Treatment/Interventions Self-care/ADL training;DME and/or AE instruction;Fluidtherapy;Moist Heat;Splinting;Balance training;Therapeutic activities;Aquatic Therapy;Ultrasound;Therapeutic exercise;Cognitive remediation/compensation;Passive range of motion;Functional Mobility Training;Neuromuscular education;Cryotherapy;Manual Therapy;Patient/family  education;Visual/perceptual remediation/compensation;Energy conservation;Electrical Stimulation    Plan d/c OT    OT Home Exercise Plan table slides    Consulted and Agree with Plan of Care Patient           Patient will benefit from skilled therapeutic intervention in order to improve the following deficits and impairments:   Body Structure / Function / Physical Skills: ADL,Dexterity,Balance,ROM,IADL,Mobility,Sensation,Strength,Tone,FMC,Coordination,Decreased knowledge of precautions,Decreased knowledge of use of DME,GMC,Pain,UE functional use,Flexibility,Improper spinal/pelvic alignment,Body mechanics,Endurance,Proprioception Cognitive Skills: Attention,Emotional,Energy/Drive     Visit Diagnosis: 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)  Muscle weakness (generalized)  Chronic left shoulder pain  Stiffness of left shoulder, not elsewhere classified  Attention and concentration deficit  Other disturbances of skin sensation    Problem List Patient Active Problem List   Diagnosis Date Noted  . S/P craniotomy 06/05/2020  . History of cranioplasty 06/05/2020  . Peri-rectal abscess 02/14/2020  . Abnormal CT scan, pelvis 02/14/2020  . Pancytopenia ([facility]) 12/02/2019  . Nephrolithiasis 12/02/2019  . Hydronephrosis with renal and ureteral calculus obstruction 12/02/2019  . Rectal pain 12/02/2019  . Hyperkalemia 12/02/2019  . Reactive depression   . Wound infection after surgery   . Sleep disturbance   . Dysphagia, post-stroke   . Transaminitis   . 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  . Cerebral abscess   . Urinary tract infection without hematuria   . Altered mental status   . Primary hypercoagulable state (HCC)   . Acute pulmonary embolism without acute cor pulmonale (HCC)   . Deep vein thrombosis (DVT) of non-extremity vein   . Hypokalemia   . Acute blood loss anemia   . Leukocytosis   . Endotracheal tube  present   . Acute respiratory failure with hypoxemia ([facility])   . Stroke (cerebrum) ([facility]) 07/19/2019  . Pressure injury of skin 07/19/2019  . Acute CVA (cerebrovascular accident) ([facility])   . Encephalopathy   . Dysphagia   . Acute encephalopathy   . Essential hypertension   . Obesity 03/12/2016  . Scalp abscess 12/20/2015  . Neck abscess 12/20/2015  . Pilonidal cyst 02/08/2013    RINE,KATHRYN 02/27/2021, 3:39 PM Kathryn Rine, OTR/L [phone] Phone: [phone] 3:59 PM 02/27/21 [facility] [address] [place], [place], [address] Phone: [phone]     Fax:  [phone]  Name: Matthew Black MRN: [id number] Date of Birth: 12/13/1975

## 2021-03-06 ENCOUNTER — Ambulatory Visit: Payer: Medicaid Other | Admitting: Occupational Therapy

## 2021-03-07 ENCOUNTER — Telehealth: Payer: Self-pay | Admitting: Medical

## 2021-03-07 ENCOUNTER — Other Ambulatory Visit: Payer: Self-pay | Admitting: Medical

## 2021-03-07 MED ORDER — ATORVASTATIN CALCIUM 10 MG PO TABS: 10.0000 mg | ORAL_TABLET | Freq: Every day | ORAL | 3 refills | Status: DC

## 2021-03-07 NOTE — Telephone Encounter (Signed)
Attempted to reach Matthew Black today to get him scheduled with the Managed Medicaid team for a phone visit. I was not able to leave a message.

## 2021-03-07 NOTE — Telephone Encounter (Signed)
Rx sent 

## 2021-03-07 NOTE — Telephone Encounter (Signed)
Medication: atorvastatin (LIPITOR) 10 MG tablet    Has the patient contacted their pharmacy? No. (If no, request that the patient contact the pharmacy for the refill.) (If yes, when and what did the pharmacy advise?)  Preferred Pharmacy (with phone number or street name):  [id number] - [place], [place] [address] Phone:  [phone]  Fax:  [phone]       Agent: Please be advised that RX refills may take up to 3 business days. We ask that you follow-up with your pharmacy.

## 2021-03-13 ENCOUNTER — Other Ambulatory Visit: Payer: Medicaid Other

## 2021-03-14 ENCOUNTER — Ambulatory Visit: Payer: Medicaid Other

## 2021-03-14 ENCOUNTER — Other Ambulatory Visit: Payer: Self-pay | Admitting: *Deleted

## 2021-03-14 ENCOUNTER — Ambulatory Visit: Admission: RE | Admit: 2021-03-14 | Payer: Medicaid Other | Source: Ambulatory Visit

## 2021-03-14 DIAGNOSIS — D573 Sickle-cell trait: Secondary | ICD-10-CM

## 2021-03-15 ENCOUNTER — Inpatient Hospital Stay: Payer: Medicaid Other | Admitting: Hematology & Oncology

## 2021-03-15 ENCOUNTER — Encounter
Payer: Medicaid Other | Attending: Physical Medicine and Rehabilitation | Admitting: Physical Medicine and Rehabilitation

## 2021-03-15 ENCOUNTER — Inpatient Hospital Stay: Payer: Medicaid Other | Attending: Hematology & Oncology

## 2021-03-15 DIAGNOSIS — I69398 Other sequelae of cerebral infarction: Secondary | ICD-10-CM | POA: Insufficient documentation

## 2021-03-15 DIAGNOSIS — R252 Cramp and spasm: Secondary | ICD-10-CM | POA: Insufficient documentation

## 2021-03-15 DIAGNOSIS — I10 Essential (primary) hypertension: Secondary | ICD-10-CM | POA: Insufficient documentation

## 2021-03-21 ENCOUNTER — Encounter: Payer: Self-pay | Admitting: Physical Medicine and Rehabilitation

## 2021-03-21 ENCOUNTER — Encounter: Payer: Medicaid Other | Admitting: Physical Medicine and Rehabilitation

## 2021-03-21 ENCOUNTER — Other Ambulatory Visit: Payer: Self-pay

## 2021-03-21 ENCOUNTER — Other Ambulatory Visit: Payer: Self-pay | Admitting: Medical

## 2021-03-21 VITALS — BP 124/78 | HR 63 | Temp 98.3°F | Ht 75.0 in

## 2021-03-21 DIAGNOSIS — I10 Essential (primary) hypertension: Secondary | ICD-10-CM | POA: Diagnosis present

## 2021-03-21 DIAGNOSIS — R252 Cramp and spasm: Secondary | ICD-10-CM

## 2021-03-21 DIAGNOSIS — I69398 Other sequelae of cerebral infarction: Secondary | ICD-10-CM | POA: Diagnosis not present

## 2021-03-21 MED ORDER — METOPROLOL TARTRATE 50 MG PO TABS: 50.0000 mg | ORAL_TABLET | Freq: Every day | ORAL | 1 refills | Status: DC

## 2021-03-21 NOTE — Progress Notes (Addendum)
Botox Injection for spasticity using ultrasound guidance  Indication: Severe spasticity which interferes with ADL,mobility and/or  hygiene and is unresponsive to medication management and other conservative care Informed consent was obtained after describing risks and benefits of the procedure with the patient. This includes bleeding, bruising, infection, excessive weakness, or medication side effects. A REMS form is on file and signed. Needle: 25cc needle Number of units per muscle Left pectoralis major: 50U  50U of Botox were discarded,  All injections were done after obtaining appropriate ultrasound visualization and after negative drawback for blood. The patient tolerated the procedure well. Post procedure instructions were given. A followup appointment was made.

## 2021-03-25 ENCOUNTER — Telehealth: Payer: Self-pay | Admitting: Medical

## 2021-03-25 NOTE — Telephone Encounter (Signed)
2nd attempt to reach Matthew Black to get him scheduled for a telephone visit with the Managed Medicaid team. I left my name and number for him to call me back.

## 2021-04-08 ENCOUNTER — Other Ambulatory Visit: Payer: Self-pay

## 2021-04-08 ENCOUNTER — Encounter: Payer: Medicaid Other | Attending: Physical Medicine and Rehabilitation | Admitting: Psychology

## 2021-04-08 DIAGNOSIS — I69352 Hemiplegia and hemiparesis following cerebral infarction affecting left dominant side: Secondary | ICD-10-CM | POA: Insufficient documentation

## 2021-04-08 DIAGNOSIS — Z9889 Other specified postprocedural states: Secondary | ICD-10-CM | POA: Insufficient documentation

## 2021-04-08 DIAGNOSIS — R4689 Other symptoms and signs involving appearance and behavior: Secondary | ICD-10-CM | POA: Insufficient documentation

## 2021-04-08 DIAGNOSIS — F0631 Mood disorder due to known physiological condition with depressive features: Secondary | ICD-10-CM | POA: Insufficient documentation

## 2021-04-08 DIAGNOSIS — I63511 Cerebral infarction due to unspecified occlusion or stenosis of right middle cerebral artery: Secondary | ICD-10-CM | POA: Diagnosis not present

## 2021-04-08 DIAGNOSIS — R252 Cramp and spasm: Secondary | ICD-10-CM | POA: Diagnosis not present

## 2021-04-08 DIAGNOSIS — I69398 Other sequelae of cerebral infarction: Secondary | ICD-10-CM | POA: Diagnosis not present

## 2021-04-11 ENCOUNTER — Encounter: Payer: Self-pay | Admitting: Psychology

## 2021-04-11 NOTE — Progress Notes (Signed)
Neuropsychological Consultation   Patient:   Matthew Black   DOB:   09-16-1976  MR Number:  [id number]  Location:  [facility] [place], [place] [id number] [place] [address] Dept: [phone]           Date of Service:   04/08/2021  Start Time:   1 PM End Time:   3 PM  Today's visit was an in person visit with the patient myself present.  1 hour 15 minutes was spent in clinical interview and another 40 minutes were spent in records review and treatment planning.  Provider/Observer:  Ilean Skill, Psy.D.       Clinical Neuropsychologist       Billing Code/Service: 54008  Chief Complaint:    Matthew Black is a 45 year old male that was referred for psychotherapeutic and neuropsychological consult due to residual effects of a past cerebrovascular event.  The patient had a stroke on 07/19/2019 and was initially seen in the comprehensive rehabilitation program prior to discharge where I also saw him on an inpatient basis.  At the time of the stroke he was found down and was nonverbal and not moving the left side of his body.  MRI showed right ICA occlusion.  Large right hemisphere infarction was identified and the patient underwent decompressive right hemicraniotomy with abdominal flap implant.  Hospital course the time was complicated by DVT.  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  The patient has continued to struggle over the past couple of years with residual effects of his cerebrovascular event.  The patient has been followed by Leeroy Cha, MD for physical medicine care and he is also had extensive rehabilitation efforts done.  Reason for Service:  Matthew Black is a 45 year old male that was referred for psychotherapeutic and neuropsychological consult due to residual effects of a past cerebrovascular event.  The patient had a stroke on 07/19/2019  and was initially seen in the comprehensive rehabilitation program prior to discharge where I also saw him on an inpatient basis.  At the time of the stroke he was found down and was nonverbal and not moving the left side of his body.  MRI showed right ICA occlusion.  Large right hemisphere infarction was identified and the patient underwent decompressive right hemicraniotomy with abdominal flap implant.  Hospital course the time was complicated by DVT.  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  The patient has continued to struggle over the past couple of years with residual effects of his cerebrovascular event.  The patient has been followed by Leeroy Cha, MD for physical medicine care and he is also had extensive rehabilitation efforts done.  The patient has struggled for a long time with significant pain and was not tolerable and it caused significant disturbance in sleep.  More recently, he has begun getting Botox injections through physical medicine and reports that it is helped a lot with his pain and is also helped with sleep.  The patient reports that his anger and aggression has been better with reduced pain but continues to struggle with adjustment with significant residual physical limitations.  The patient's expressive language has improved dramatically but he continues to have significant motor deficits on his right side.  The patient reports that there is a significant issue with quality of life and he is still not adjusted to significant limitations.  The patient reports that he feels like his house  is "a prison cell" and that he has difficulty being able to go outside independently.  The patient needs a brace to be able to stand up and he cannot put this brace on independently.  The patient reports that when he is able to get outside and be in the sunshine that he feels much better and that his wife does as much as she can but she is working.  The patient also cannot keep  the brace on all the time because it hurts if he tries to keep it on the entire day.  Behavioral Observation: Matthew Black  presents as a 45 y.o.-year-old Right African American Male who appeared his stated age. his dress was Appropriate and he was Well Groomed and his manners were Appropriate to the situation.  his participation was indicative of Appropriate and Attentive behaviors.  There were physical disabilities noted.  he displayed an appropriate level of cooperation and motivation.     Interactions:    Active Appropriate  Attention:   within normal limits and attention span and concentration were age appropriate  Memory:   within normal limits; recent and remote memory intact  Visuo-spatial:  not examined  Speech (Volume):  normal  Speech:   normal; normal  Thought Process:  Coherent and Relevant  Though Content:  WNL; not suicidal and not homicidal  Orientation:   person, place, time/date and situation  Judgment:   Good  Planning:   Fair  Affect:    Blunted  Mood:    Dysphoric  Insight:   Good  Intelligence:   normal  Substance Use:  No concerns of substance abuse are reported.    Medical History:   Past Medical History:  Diagnosis Date  . 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 Depression   . DVT (deep venous thrombosis) ([place])    BLE DVT 07/21/19, 08/02/19; s/p retrievable IVC filter 07/21/19  . Hemorrhagic stroke ([place])   . History of kidney stones   . Hypertension   . Paralysis ([place])    LEFT SIDE  . PE (pulmonary thromboembolism) ([place])    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  . Stroke [facility])    RICA, R A1, R MCA occlusion 07/19/19         Patient Active Problem List   Diagnosis Date Noted  . S/P craniotomy 06/05/2020  . History of cranioplasty 06/05/2020  . Peri-rectal abscess 02/14/2020  . Abnormal CT scan, pelvis 02/14/2020  . Pancytopenia ([place]) 12/02/2019  . Nephrolithiasis 12/02/2019  . Hydronephrosis with renal and  ureteral calculus obstruction 12/02/2019  . Rectal pain 12/02/2019  . Hyperkalemia 12/02/2019  . Reactive depression   . Wound infection after surgery   . Sleep disturbance   . Dysphagia, post-stroke   . Transaminitis   . 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 Cerebral abscess   . Urinary tract infection without hematuria   . Altered mental status   . Primary hypercoagulable state ([place])   . Acute pulmonary embolism without acute cor pulmonale (HCC)   . Deep vein thrombosis (DVT) of non-extremity vein   . Hypokalemia   . Acute blood loss anemia   . Leukocytosis   . Endotracheal tube present   . Acute respiratory failure with hypoxemia ([place])   . Stroke (cerebrum) ([place]) 07/19/2019  . Pressure injury of skin 07/19/2019  . Acute CVA (cerebrovascular accident) ([place])   . Encephalopathy   . Dysphagia   . Acute encephalopathy   .  Essential hypertension   . Obesity 03/12/2016  . Scalp abscess 12/20/2015  . Neck abscess 12/20/2015  . Pilonidal cyst 02/08/2013         Abuse/Trauma History: The patient had a significant cerebrovascular accident on 07/19/2019 with significant occlusion of his right ICA with a large right hemispheric infarction.  Psychiatric History:  Patient at times with significant pain resulting from his cerebrovascular event as well as left side motor deficits and paralysis.  He has been active with therapy and has improved his ability to stand up and ambulate with a walker to some degree.  He still uses a wheelchair most of the time to get around.  Patient has had times with significant sleep disturbance and resulting anger and aggression with both the loss of function as well as prolonged sleep deprivation.  Family Med/Psych History:  Family History  Problem Relation Age of Onset  . Healthy Mother   . Healthy Father   . Clotting disorder Maternal Grandmother   . Diabetes Paternal Grandfather   . Clotting disorder Maternal Aunt   . Clotting  disorder Maternal Uncle   . Colon cancer Neg Hx     Impression/DX:  Matthew Black is a 45 year old male that was referred for psychotherapeutic and neuropsychological consult due to residual effects of a past cerebrovascular event.  The patient had a stroke on 07/19/2019 and was initially seen in the comprehensive rehabilitation program prior to discharge where I also saw him on an inpatient basis.  At the time of the stroke he was found down and was nonverbal and not moving the left side of his body.  MRI showed right ICA occlusion.  Large right hemisphere infarction was identified and the patient underwent decompressive right hemicraniotomy with abdominal flap implant.  Hospital course the time was complicated by DVT.  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  The patient has continued to struggle over the past couple of years with residual effects of his cerebrovascular event.  The patient has been followed by Leeroy Cha, MD for physical medicine care and he is also had extensive rehabilitation efforts done.  Disposition/Plan:  We have set the patient up for formal therapeutic interventions to work on coping and adjustment issues with residual significant left-sided motor deficits following CVA in 2020.  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 Clinical Neuropsychologist

## 2021-04-20 ENCOUNTER — Emergency Department (HOSPITAL_COMMUNITY): Payer: Medicaid Other

## 2021-04-20 ENCOUNTER — Other Ambulatory Visit: Payer: Self-pay

## 2021-04-20 ENCOUNTER — Inpatient Hospital Stay (HOSPITAL_COMMUNITY)
Admission: EM | Admit: 2021-04-20 | Discharge: 2021-04-23 | DRG: 177 | Disposition: A | Discharge status: Home Health | Payer: Medicaid Other | Attending: Internal Medicine | Admitting: Internal Medicine

## 2021-04-20 DIAGNOSIS — I639 Cerebral infarction, unspecified: Secondary | ICD-10-CM | POA: Diagnosis present

## 2021-04-20 DIAGNOSIS — Z86718 Personal history of other venous thrombosis and embolism: Secondary | ICD-10-CM

## 2021-04-20 DIAGNOSIS — I6389 Other cerebral infarction: Secondary | ICD-10-CM | POA: Diagnosis not present

## 2021-04-20 DIAGNOSIS — J1282 Pneumonia due to coronavirus disease 2019: Secondary | ICD-10-CM | POA: Diagnosis present

## 2021-04-20 DIAGNOSIS — I10 Essential (primary) hypertension: Secondary | ICD-10-CM | POA: Diagnosis present

## 2021-04-20 DIAGNOSIS — G9389 Other specified disorders of brain: Secondary | ICD-10-CM | POA: Diagnosis not present

## 2021-04-20 DIAGNOSIS — R29898 Other symptoms and signs involving the musculoskeletal system: Secondary | ICD-10-CM

## 2021-04-20 DIAGNOSIS — S0990XA Unspecified injury of head, initial encounter: Secondary | ICD-10-CM | POA: Diagnosis not present

## 2021-04-20 DIAGNOSIS — D6859 Other primary thrombophilia: Secondary | ICD-10-CM | POA: Diagnosis present

## 2021-04-20 DIAGNOSIS — Y92009 Unspecified place in unspecified non-institutional (private) residence as the place of occurrence of the external cause: Secondary | ICD-10-CM

## 2021-04-20 DIAGNOSIS — U071 COVID-19: Principal | ICD-10-CM

## 2021-04-20 DIAGNOSIS — S199XXA Unspecified injury of neck, initial encounter: Secondary | ICD-10-CM | POA: Diagnosis not present

## 2021-04-20 DIAGNOSIS — Z9889 Other specified postprocedural states: Secondary | ICD-10-CM

## 2021-04-20 DIAGNOSIS — R531 Weakness: Secondary | ICD-10-CM

## 2021-04-20 DIAGNOSIS — R4781 Slurred speech: Secondary | ICD-10-CM | POA: Diagnosis not present

## 2021-04-20 DIAGNOSIS — W109XXA Fall (on) (from) unspecified stairs and steps, initial encounter: Secondary | ICD-10-CM | POA: Diagnosis present

## 2021-04-20 DIAGNOSIS — H538 Other visual disturbances: Secondary | ICD-10-CM | POA: Diagnosis not present

## 2021-04-20 DIAGNOSIS — M47812 Spondylosis without myelopathy or radiculopathy, cervical region: Secondary | ICD-10-CM | POA: Diagnosis not present

## 2021-04-20 DIAGNOSIS — R29810 Facial weakness: Secondary | ICD-10-CM | POA: Diagnosis not present

## 2021-04-20 DIAGNOSIS — R509 Fever, unspecified: Secondary | ICD-10-CM | POA: Diagnosis not present

## 2021-04-20 DIAGNOSIS — Z86711 Personal history of pulmonary embolism: Secondary | ICD-10-CM

## 2021-04-20 DIAGNOSIS — I69354 Hemiplegia and hemiparesis following cerebral infarction affecting left non-dominant side: Secondary | ICD-10-CM

## 2021-04-20 LAB — COMPREHENSIVE METABOLIC PANEL
ALT: 28 U/L (ref 0–44)
AST: 28 U/L (ref 15–41)
Albumin: 3.7 g/dL (ref 3.5–5.0)
Alkaline Phosphatase: 116 U/L (ref 38–126)
Anion gap: 7 (ref 5–15)
BUN: 10 mg/dL (ref 6–20)
CO2: 25 mmol/L (ref 22–32)
Calcium: 9 mg/dL (ref 8.9–10.3)
Chloride: 107 mmol/L (ref 98–111)
Creatinine, Ser: 1.41 mg/dL — ABNORMAL HIGH (ref 0.61–1.24)
GFR, Estimated: >60 mL/min (ref >60)
Glucose, Bld: 99 mg/dL (ref 70–99)
Potassium: 3.6 mmol/L (ref 3.5–5.1)
Sodium: 139 mmol/L (ref 135–145)
Total Bilirubin: 0.5 mg/dL (ref 0.3–1.2)
Total Protein: 7.6 g/dL (ref 6.5–8.1)

## 2021-04-20 LAB — CBC
HCT: 46.2 % (ref 39.0–52.0)
Hemoglobin: 14.8 g/dL (ref 13.0–17.0)
MCH: 26.6 pg (ref 26.0–34.0)
MCHC: 32 g/dL (ref 30.0–36.0)
MCV: 82.9 fL (ref 80.0–100.0)
Platelets: 226 10*3/uL (ref 150–400)
RBC: 5.57 MIL/uL (ref 4.22–5.81)
RDW: 14.9 % (ref 11.5–15.5)
WBC: 8.7 10*3/uL (ref 4.0–10.5)
nRBC: 0 % (ref 0.0–0.2)

## 2021-04-20 LAB — RESP PANEL BY RT-PCR (FLU A&B, COVID) ARPGX2
Influenza A by PCR: NEGATIVE
Influenza B by PCR: NEGATIVE
SARS Coronavirus 2 by RT PCR: POSITIVE — AB

## 2021-04-20 LAB — RAPID URINE DRUG SCREEN, HOSP PERFORMED
Amphetamines: NOT DETECTED
Barbiturates: NOT DETECTED
Benzodiazepines: NOT DETECTED
Cocaine: NOT DETECTED
Opiates: NOT DETECTED
Tetrahydrocannabinol: NOT DETECTED

## 2021-04-20 LAB — URINALYSIS, ROUTINE W REFLEX MICROSCOPIC
Bacteria, UA: NONE SEEN
Bilirubin Urine: NEGATIVE
Glucose, UA: NEGATIVE mg/dL
Ketones, ur: NEGATIVE mg/dL
Leukocytes,Ua: NEGATIVE
Nitrite: NEGATIVE
Protein, ur: NEGATIVE mg/dL
RBC / HPF: >50 RBC/hpf — ABNORMAL HIGH (ref 0–5)
Specific Gravity, Urine: 1.016 (ref 1.005–1.030)
pH: 7 (ref 5.0–8.0)

## 2021-04-20 LAB — DIFFERENTIAL
Abs Immature Granulocytes: 0.02 10*3/uL (ref 0.00–0.07)
Basophils Absolute: 0.1 10*3/uL (ref 0.0–0.1)
Basophils Relative: 1 %
Eosinophils Absolute: 0 10*3/uL (ref 0.0–0.5)
Eosinophils Relative: 0 %
Immature Granulocytes: 0 %
Lymphocytes Relative: 22 %
Lymphs Abs: 1.9 10*3/uL (ref 0.7–4.0)
Monocytes Absolute: 1.5 10*3/uL — ABNORMAL HIGH (ref 0.1–1.0)
Monocytes Relative: 18 %
Neutro Abs: 5.1 10*3/uL (ref 1.7–7.7)
Neutrophils Relative %: 59 %

## 2021-04-20 LAB — I-STAT CHEM 8, ED
BUN: 10 mg/dL (ref 6–20)
Calcium, Ion: 1 mmol/L — ABNORMAL LOW (ref 1.15–1.40)
Chloride: 107 mmol/L (ref 98–111)
Creatinine, Ser: 1.4 mg/dL — ABNORMAL HIGH (ref 0.61–1.24)
Glucose, Bld: 97 mg/dL (ref 70–99)
HCT: 46 % (ref 39.0–52.0)
Hemoglobin: 15.6 g/dL (ref 13.0–17.0)
Potassium: 3.6 mmol/L (ref 3.5–5.1)
Sodium: 141 mmol/L (ref 135–145)
TCO2: 22 mmol/L (ref 22–32)

## 2021-04-20 LAB — PROTIME-INR
INR: 1.1 (ref 0.8–1.2)
Prothrombin Time: 14.5 seconds (ref 11.4–15.2)

## 2021-04-20 LAB — ETHANOL: Alcohol, Ethyl (B): <10 mg/dL (ref <10)

## 2021-04-20 LAB — APTT: aPTT: 27 seconds (ref 24–36)

## 2021-04-20 LAB — CBG MONITORING, ED: Glucose-Capillary: 84 mg/dL (ref 70–99)

## 2021-04-20 MED ORDER — ACETAMINOPHEN 500 MG PO TABS
1000.0000 mg | ORAL_TABLET | Freq: Once | ORAL | Status: AC
  Administered 2021-04-21: 1000 mg via ORAL
  Filled 2021-04-20: qty 2

## 2021-04-20 NOTE — ED Triage Notes (Addendum)
Pt BIB EMS from home. EMS reports pt fell down 4 stairs and started having stroke symptoms. Pt had previous stroke and has left sided deficits (l arm paralysis and l arm weakness). Pt wife and EMS reports after the fall pt had increased L sided weakness, abnormal speech, and difficulty identifying objects. VS with EMS  170/80 HR 110 96% on RA  Temp 101.2

## 2021-04-20 NOTE — Code Documentation (Signed)
Responded to Code Stroke called at 2126 for L sided weakness/abnormal speech, [id number]. Per EMS, pt fell down the stairs, hit the R side of his head, and now has L leg weakness. Pt has h/o prior R MCA stroke with residual L arm paralysis and L leg weakness. Per pt, his L leg weakness is worse than it normally is. NIH-11, CBG-84, CT head-questionable acute/subacute areas of blood. TPA not given-h/o ICH. Plan for MRI.

## 2021-04-20 NOTE — ED Notes (Signed)
Dr. Rex Kras notified of pt temp

## 2021-04-20 NOTE — ED Notes (Signed)
Dr. Rex Kras notified of pt covid results

## 2021-04-20 NOTE — Consult Note (Signed)
NEUROLOGY CONSULTATION NOTE   Date of service: Apr 20, 2021 Patient Name: Matthew Black MRN:  [id number] DOB:  12/15/1975 Reason for consult: "Stroke code" Requesting Provider: Sharlett Iles, MD _ _ _   _ __   _ __ _ _  __ __   _ __   __ _  History of Present Illness  Matthew Black is a 45 y.o. male with PMH significant for history of PE, DVTs, kidney stones, HTN, prior large R MCA stroke s/p decompressive hemicrani which was complicated by hemorrhagic conversion with hematoma and had residual Left arm plegia and L leg weakness as a result of his stroke. He is brought in by Ems today after he had a fall down the stairs where he hit the Right side of his head and was weaker on his left leg afterwards.  Patient reports that he was walking down the stairs and fell and hit his head on the right side and noted the weakness in his left leg afterwards.  He usually warms up his leg before walking up or down the stairs and he did this today 2.  Did not feel any particular weakness during that.  Denies any fever or chills but does report that his grandmother felt he was more sick and had a cough and some runny nose.  CT head without contrast with hyperdensity concerning for dystrophic calcification and also concern for some scattered internal hyperdensity which may potentially be an acute on subacute blood products.  MRS: 2 TPA: Not a candidate due to history of ICH Thrombectomy: Low concern for LVO, mild increase in deficit compared to his baseline. NIHSS components Score: Comment  1a Level of Conscious 0[x]  1[]  2[]  3[]      1b LOC Questions 0[x]  1[]  2[]       1c LOC Commands 0[x]  1[]  2[]       2 Best Gaze 0[x]  1[]  2[]       3 Visual 0[]  1[]  2[x]  3[]      4 Facial Palsy 0[]  1[x]  2[]  3[]      5a Motor Arm - left 0[]  1[]  2[]  3[]  4[x]  UN[]    5b Motor Arm - Right 0[x]  1[]  2[]  3[]  4[]  UN[]    6a Motor Leg - Left 0[]  1[]  2[x]  3[]  4[]  UN[]    6b Motor Leg - Right 0[x]  1[]  2[]  3[]  4[]  UN[]    7 Limb  Ataxia 0[x]  1[]  2[]  3[]  UN[]     8 Sensory 0[x]  1[]  2[]  UN[]      9 Best Language 0[x]  1[]  2[]  3[]      10 Dysarthria 0[x]  1[]  2[]  UN[]      11 Extinct. and Inattention 0[]  1[x]  2[]       TOTAL: 10      ROS   Constitutional Denies weight loss, fever and chills.   HEENT Denies changes in vision and hearing.   Respiratory Denies SOB but does endorse cough.   CV Denies palpitations and CP   GI Denies abdominal pain, nausea, vomiting and diarrhea.   GU Denies dysuria and urinary frequency.   MSK Denies myalgia and joint pain.   Skin Denies rash and pruritus.   Neurological Denies headache and syncope.   Psychiatric Denies recent changes in mood. Denies anxiety and depression.    Past History   Past Medical History:  Diagnosis Date  . 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 Depression   . DVT (deep venous thrombosis) ([place])    BLE DVT 07/21/19, 08/02/19; s/p retrievable IVC filter 07/21/19  . Hemorrhagic  stroke ([place])   . History of kidney stones   . Hypertension   . 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 PE (pulmonary thromboembolism) ([place])    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  . Stroke [facility])    RICA, R A1, R MCA occlusion 07/19/19   Past Surgical History:  Procedure Laterality Date  . CRANIOPLASTY Right 06/05/2020   Procedure: CRANIOPLASTY;  Surgeon: Consuella Lose, MD;  Location: [facility];  Service: Neurosurgery;  Laterality: Right;  right  . CRANIOTOMY Right 07/19/2019   Procedure: RIGHT HEMI-CRANIECTOMY With implantation of skull flap to abdominal wall;  Surgeon: Consuella Lose, MD;  Location: [facility];  Service: Neurosurgery;  Laterality: Right;  . CYST EXCISION N/A 10/08/2016   Procedure: EXCISION OF POSTERIOR NECK CYST;  Surgeon: Clovis Riley, MD;  Location: [facility];  Service: General;  Laterality: N/A;  . CYSTOSCOPY/URETEROSCOPY/HOLMIUM LASER/STENT PLACEMENT Right 03/16/2020   Procedure: CYSTOSCOPY RIGHT RETROGRADE PYELOGRAM URETEROSCOPY/HOLMIUM  LASER/STENT PLACEMENT;  Surgeon: Lucas Mallow, MD;  Location: [facility];  Service: Urology;  Laterality: Right;  . INCISION AND DRAINAGE ABSCESS N/A 09/22/2014   Procedure: INCISION AND DRAINAGE ABSCESS POSTERIOR NECK;  Surgeon: Pedro Earls, MD;  Location: [facility];  Service: General;  Laterality: N/A;  . INCISION AND DRAINAGE ABSCESS N/A 12/20/2015   Procedure: INCISION AND DRAINAGE POSTERIOR NECK MASS;  Surgeon: Armandina Gemma, MD;  Location: [facility];  Service: General;  Laterality: N/A;  . INCISION AND DRAINAGE ABSCESS Left 07/10/2004   middle finger  . IR IVC FILTER PLMT / S&I /IMG GUID/MOD SED  07/21/2019  . IR RADIOLOGIST EVAL & MGMT  12/14/2019  . IR RADIOLOGIST EVAL & MGMT  12/21/2020  . IR VENOGRAM RENAL UNI RIGHT  07/21/2019  . MASS EXCISION N/A 07/21/2017   Procedure: EXCISION OF BENIGN NECK LESION WITH LAYERED CLOSURE;  Surgeon: Irene Limbo, MD;  Location: [place];  Service: Plastics;  Laterality: N/A;  . MASS EXCISION N/A 11/10/2017   Procedure: EXCISION BENIGN LESION OF THE NECK WITH LAYERED CLOSURE;  Surgeon: Irene Limbo, MD;  Location: [place];  Service: Plastics;  Laterality: N/A;   Family History  Problem Relation Age of Onset  . Healthy Mother   . Healthy Father   . Clotting disorder Maternal Grandmother   . Diabetes Paternal Grandfather   . Clotting disorder Maternal Aunt   . Clotting disorder Maternal Uncle   . Colon cancer Neg Hx    Social History   Socioeconomic History  . Marital status: Married    Spouse name: Melene Muller  . Number of children: 2  . Years of education: Not on file  . Highest education level: Not on file  Occupational History  . Occupation: unemployed 01/30/21  Tobacco Use  . Smoking status: Former Smoker    Packs/day: 0.00    Quit date: 11/24/2015    Years since quitting: 5.4  . Smokeless tobacco: Never Used  . Tobacco comment:    Vaping Use  . Vaping Use: Never used  Substance and Sexual  Activity  . Alcohol use: Yes    Comment: socially  . Drug use: Never  . Sexual activity: Not Currently  Other Topics Concern  . Not on file  Social History Narrative   Lives with wife and kids   Right Handed   Drinks >10 cups caffeine daily   Social Determinants of Health   Financial Resource Strain: Not on file  Food Insecurity:  Not on file  Transportation Needs: Not on file  Physical Activity: Not on file  Stress: Not on file  Social Connections: Not on file   Allergies  Allergen Reactions  . Hydrocodone Nausea Only    Medications  (Not in a hospital admission)    Vitals   Vitals:   04/20/21 2100 04/20/21 2217  BP:  139/85  Pulse:  (!) 101  Resp:  20  Temp:  (!) 102.1 F (38.9 C)  SpO2:  93%  Weight: 121.8 kg      Body mass index is 33.56 kg/m.  Physical Exam   General: Laying comfortably in bed; in no acute distress.  HENT: Normal oropharynx and mucosa. Normal external appearance of ears and nose.  Neck: Supple, no pain or tenderness  CV: No JVD. No peripheral edema.  Pulmonary: Symmetric Chest rise. Normal respiratory effort.  Abdomen: Soft to touch, non-tender.  Ext: No cyanosis, edema, or deformity  Skin: No rash. Normal palpation of skin.   Musculoskeletal: Normal digits and nails by inspection. No clubbing.   Neurologic Examination  Mental status/Cognition: Alert, oriented to self, place, month and year, good attention.  Speech/language: mild decrease in fluency, comprehension intact, object naming intact, repetition intact.  Cranial nerves:   CN II Pupils equal and reactive to light, left hemianopsia.   CN III,IV,VI EOM intact, no gaze preference or deviation, no nystagmus    CN V normal sensation in V1, V2, and V3 segments bilaterally    CN VII no asymmetry, no nasolabial fold flattening    CN VIII normal hearing to speech    CN IX & X normal palatal elevation, no uvular deviation    CN XI 5/5 head turn and 5/5 shoulder shrug bilaterally    CN XII midline tongue protrusion    Motor:  Muscle bulk: decreased, tone increased in LUE, tremor none Mvmt Root Nerve  Muscle Right Left Comments  SA C5/6 Ax Deltoid 5 1   EF C5/6 Mc Biceps 5 0   EE C6/7/8 Rad Triceps 5 0   WF C6/7 Med FCR     WE C7/8 PIN ECU     F Ab C8/T1 U ADM/FDI 5 0   HF L1/2/3 Fem Illopsoas 5 3   KE L2/3/4 Fem Quad 5    DF L4/5 D Peron Tib Ant 5 3   PF S1/2 Tibial Grc/Sol 5 3    Reflexes:  Right Left Comments  Pectoralis      Biceps (C5/6) 2 3   Brachioradialis (C5/6) 2 3    Triceps (C6/7) 2     Patellar (L3/4) 2 2+    Achilles (S1)      Hoffman      Plantar     Jaw jerk    Sensation:  Light touch Intact throughout   Pin prick    Temperature    Vibration   Proprioception    Coordination/Complex Motor:  - Finger to Nose intact on the right - Heel to shin intact on the right - Rapid alternating movement intact on the right - Gait: deferred.  Labs   CBC:  Recent Labs  Lab 04/20/21 2140 04/20/21 2142  WBC 8.7  --   NEUTROABS 5.1  --   HGB 14.8 15.6  HCT 46.2 46.0  MCV 82.9  --   PLT 226  --     Basic Metabolic Panel:  Lab Results  Component Value Date   NA 141 04/20/2021   K 3.6 04/20/2021  CO2 25 04/20/2021   GLUCOSE 97 04/20/2021   BUN 10 04/20/2021   CREATININE 1.40 (H) 04/20/2021   CALCIUM 9.0 04/20/2021   GFRNONAA >60 04/20/2021   GFRAA >60 06/06/2020   Lipid Panel:  Lab Results  Component Value Date   LDLCALC 78 01/30/2021   HgbA1c:  Lab Results  Component Value Date   HGBA1C 5.6 01/30/2021   Urine Drug Screen:     Component Value Date/Time   LABOPIA NONE DETECTED 07/19/2019 1348   COCAINSCRNUR NONE DETECTED 07/19/2019 1348   LABBENZ NONE DETECTED 07/19/2019 1348   AMPHETMU NONE DETECTED 07/19/2019 1348   THCU POSITIVE (A) 07/19/2019 1348   LABBARB NONE DETECTED 07/19/2019 1348    Alcohol Level     Component Value Date/Time   ETH <10 04/20/2021 2140    CT Head without contrast: 1. Interval  decrease in size of heterogeneous area/collection overlying the right frontal convexity, now measuring 6.7 x 1.8 cm. Nodular hyperdensity at the deep aspect of this area favored to reflect dystrophic calcification. Finding favored to reflect a chronic hematoma. Scattered areas of internal hyperdensity could reflect a small amount of acute to subacute blood products. No mass effect. 2. ASPECTS is 10. 3. Underlying extensive encephalomalacia throughout the right cerebral hemisphere related to prior right ACA/MCA territory infarct. Associated volume loss with left-to-right shift and ex vacuo dilatation of the right lateral ventricle.  MRI Brain: pending  Impression   Matthew Black is a 45 y.o. male with PMH significant for history of PE, DVTs, kidney stones, HTN, prior large R MCA stroke s/p decompressive hemicrani which was complicated by hemorrhagic conversion with hematoma and had residual Left arm plegia and L leg weakness as a result of his stroke. He is brought in by [facility] today after he had a fall down the stairs where he hit the Right side of his head and was weaker on his left leg afterwards. His neurologic examination is notable for mild LLE drift that is worse than his baseline along with chronic L hemianopsia and LUE weakness.  Not a candidate for tPA given hx of hemorrhage, low concern for LVO therefore thrombectomy workup not pursued. There is concern for some acute/subacute blood on the [facility] located within region of prior stroke. His weakness maybe a new stroke, recrudescence and worsening prior stroke symptoms in the setting of toxic metabolic process(has a new fever with Tmax of 102) or could potentially be due to traumatic blood products.  Primary Diagnosis:   Fall Recrudescence of prior stroke  Recommendations  Recommend MRI Brain with and without contrast. Full stroke workup only if the MRI Brain shows a new  stroke. ______________________________________________________________________   Thank you for the opportunity to take part in the care of this patient. If you have any further questions, please contact the neurology consultation attending.  Signed,  [place] Pager Number [phone] _ _ _   _ __   _ __ _ _  __ __   _ __   __ _

## 2021-04-20 NOTE — ED Provider Notes (Signed)
[place] EMERGENCY DEPARTMENT Provider Note   CSN: [id number] Arrival date & time: 04/20/21  2137  An emergency department physician performed an initial assessment on this suspected stroke patient at 2136.  History Chief Complaint  Patient presents with  . Code Stroke    Matthew Black is a 45 y.o. male.  45 year old male with extensive past medical history below including stroke with residual left-sided weakness, PE/DVT, hypertension who presents as code stroke.  1.5 hours prior to arrival, the patient had a fall down some stairs.  Afterwards, his wife noted that he was having weakness of his left leg worse than his baseline.  She also noted some problems with his speech and he endorsed some double vision.  EMS was called and they called a code stroke in route.  Patient reports low back pain since the fall.  LEVEL 5 CAVEAT DUE TO AMS/SPEECH DIFFICULTY  The history is provided by the patient and the EMS personnel.       Past Medical History:  Diagnosis Date  . 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 Depression   . DVT (deep venous thrombosis) ([place])    BLE DVT 07/21/19, 08/02/19; s/p retrievable IVC filter 07/21/19  . Hemorrhagic stroke ([place])   . History of kidney stones   . Hypertension   . 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 PE (pulmonary thromboembolism) ([place])    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  . Stroke [facility])    RICA, R A1, R MCA occlusion 07/19/19    Patient Active Problem List   Diagnosis Date Noted  . S/P craniotomy 06/05/2020  . History of cranioplasty 06/05/2020  . Peri-rectal abscess 02/14/2020  . Abnormal CT scan, pelvis 02/14/2020  . Pancytopenia ([place]) 12/02/2019  . Nephrolithiasis 12/02/2019  . Hydronephrosis with renal and ureteral calculus obstruction 12/02/2019  . Rectal pain 12/02/2019  . Hyperkalemia 12/02/2019  . Reactive depression   . Wound infection after surgery   . Sleep disturbance   .  Dysphagia, post-stroke   . Transaminitis   . 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 Cerebral abscess   . Urinary tract infection without hematuria   . Altered mental status   . Primary hypercoagulable state ([place])   . Acute pulmonary embolism without acute cor pulmonale (HCC)   . Deep vein thrombosis (DVT) of non-extremity vein   . Hypokalemia   . Acute blood loss anemia   . Leukocytosis   . Endotracheal tube present   . Acute respiratory failure with hypoxemia ([place])   . Stroke (cerebrum) ([place]) 07/19/2019  . Pressure injury of skin 07/19/2019  . Acute CVA (cerebrovascular accident) ([place])   . Encephalopathy   . Dysphagia   . Acute encephalopathy   . Essential hypertension   . Obesity 03/12/2016  . Scalp abscess 12/20/2015  . Neck abscess 12/20/2015  . Pilonidal cyst 02/08/2013    Past Surgical History:  Procedure Laterality Date  . CRANIOPLASTY Right 06/05/2020   Procedure: CRANIOPLASTY;  Surgeon: Consuella Lose, MD;  Location: [place];  Service: Neurosurgery;  Laterality: Right;  right  . CRANIOTOMY Right 07/19/2019   Procedure: RIGHT HEMI-CRANIECTOMY With implantation of skull flap to abdominal wall;  Surgeon: Consuella Lose, MD;  Location: [place];  Service: Neurosurgery;  Laterality: Right;  . CYST EXCISION N/A 10/08/2016   Procedure: EXCISION OF POSTERIOR NECK CYST;  Surgeon: Clovis Riley, MD;  [facility];  Service: General;  Laterality: N/A;  .  CYSTOSCOPY/URETEROSCOPY/HOLMIUM LASER/STENT PLACEMENT Right 03/16/2020   Procedure: CYSTOSCOPY RIGHT RETROGRADE PYELOGRAM URETEROSCOPY/HOLMIUM LASER/STENT PLACEMENT;  Surgeon: Lucas Mallow, MD;  [facility];  Service: Urology;  Laterality: Right;  . INCISION AND DRAINAGE ABSCESS N/A 09/22/2014   Procedure: INCISION AND DRAINAGE ABSCESS POSTERIOR NECK;  Surgeon: Pedro Earls, MD;  [facility];  Service: General;  Laterality: N/A;  . INCISION AND DRAINAGE ABSCESS N/A 12/20/2015    Procedure: INCISION AND DRAINAGE POSTERIOR NECK MASS;  Surgeon: Armandina Gemma, MD;  [facility];  Service: General;  Laterality: N/A;  . INCISION AND DRAINAGE ABSCESS Left 07/10/2004   middle finger  . IR IVC FILTER PLMT / S&I /IMG GUID/MOD SED  07/21/2019  . IR RADIOLOGIST EVAL & MGMT  12/14/2019  . IR RADIOLOGIST EVAL & MGMT  12/21/2020  . IR VENOGRAM RENAL UNI RIGHT  07/21/2019  . MASS EXCISION N/A 07/21/2017   Procedure: EXCISION OF BENIGN NECK LESION WITH LAYERED CLOSURE;  Surgeon: Irene Limbo, MD;  Location: [place];  Service: Plastics;  Laterality: N/A;  . MASS EXCISION N/A 11/10/2017   Procedure: EXCISION BENIGN LESION OF THE NECK WITH LAYERED CLOSURE;  Surgeon: Irene Limbo, MD;  Location: [place];  Service: Plastics;  Laterality: N/A;       Family History  Problem Relation Age of Onset  . Healthy Mother   . Healthy Father   . Clotting disorder Maternal Grandmother   . Diabetes Paternal Grandfather   . Clotting disorder Maternal Aunt   . Clotting disorder Maternal Uncle   . Colon cancer Neg Hx     Social History   Tobacco Use  . Smoking status: Former Smoker    Packs/day: 0.00    Quit date: 11/24/2015    Years since quitting: 5.4  . Smokeless tobacco: Never Used  . Tobacco comment:    Vaping Use  . Vaping Use: Never used  Substance Use Topics  . Alcohol use: Yes    Comment: socially  . Drug use: Never    Home Medications Prior to Admission medications   Medication Sig Start Date End Date Taking? Authorizing Provider  aspirin EC 81 MG tablet Take 1 tablet (81 mg total) by mouth daily. Swallow whole. 01/30/21  Yes Garvin Fila, MD  atorvastatin (LIPITOR) 10 MG tablet TAKE 1 TABLET BY MOUTH EVERY DAY Patient taking differently: Take 10 mg by mouth daily. 03/21/21  Yes Saguier, Percell Miller, PA-C  baclofen (LIORESAL) 10 MG tablet Take 1 tablet (10 mg total) by mouth 2 (two) times daily. 11/20/20  Yes Saguier, Percell Miller, PA-C   metoprolol tartrate (LOPRESSOR) 50 MG tablet Take 1 tablet (50 mg total) by mouth daily. 03/21/21  Yes Raulkar, Clide Deutscher, MD  venlafaxine XR (EFFEXOR-XR) 37.5 MG 24 hr capsule TAKE 1 CAPSULE BY MOUTH DAILY WITH BREAKFAST. Patient not taking: Reported on 04/21/2021 12/17/20   Saguier, Percell Miller, PA-C    Allergies    Hydrocodone  Review of Systems   Review of Systems  Unable to perform ROS: Mental status change    Physical Exam Updated Vital Signs BP 122/90   Pulse 95   Temp (!) 102.1 F (38.9 C)   Resp (!) 23   Wt 121.8 kg   SpO2 97%   BMI 33.56 kg/m   Physical Exam Constitutional:      General: He is not in acute distress.    Appearance: Normal appearance.  HENT:     Head: Atraumatic.  Comments: S/p craniotomy    Mouth/Throat:     Mouth: Mucous membranes are moist.     Pharynx: Oropharynx is clear.  Eyes:     Extraocular Movements: Extraocular movements intact.     Conjunctiva/sclera: Conjunctivae normal.     Pupils: Pupils are equal, round, and reactive to light.  Cardiovascular:     Rate and Rhythm: Normal rate and regular rhythm.     Heart sounds: Normal heart sounds. No murmur heard.   Pulmonary:     Effort: Pulmonary effort is normal.     Breath sounds: Normal breath sounds.  Abdominal:     General: Abdomen is flat. Bowel sounds are normal. There is no distension.     Palpations: Abdomen is soft.     Tenderness: There is no abdominal tenderness.  Musculoskeletal:     Cervical back: Neck supple.     Right lower leg: No edema.     Left lower leg: No edema.  Skin:    General: Skin is warm and dry.  Neurological:     Mental Status: He is alert and oriented to person, place, and time.     Comments: Delayed speech with some word finding difficulty, no facial droop, L arm contracture; L leg weakness compared to R  Psychiatric:        Mood and Affect: Mood normal.     ED Results / Procedures / Treatments   Labs (all labs ordered are listed, but only  abnormal results are displayed) Labs Reviewed  RESP PANEL BY RT-PCR (FLU A&B, COVID) ARPGX2 - Abnormal; Notable for the following components:      Result Value   SARS Coronavirus 2 by RT PCR POSITIVE (*)    All other components within normal limits  DIFFERENTIAL - Abnormal; Notable for the following components:   Monocytes Absolute 1.5 (*)    All other components within normal limits  COMPREHENSIVE METABOLIC PANEL - Abnormal; Notable for the following components:   Creatinine, Ser 1.41 (*)    All other components within normal limits  URINALYSIS, ROUTINE W REFLEX MICROSCOPIC - Abnormal; Notable for the following components:   APPearance HAZY (*)    Hgb urine dipstick MODERATE (*)    RBC / HPF >50 (*)    All other components within normal limits  I-STAT CHEM 8, ED - Abnormal; Notable for the following components:   Creatinine, Ser 1.40 (*)    Calcium, Ion 1.00 (*)    All other components within normal limits  ETHANOL  PROTIME-INR  APTT  CBC  RAPID URINE DRUG SCREEN, HOSP PERFORMED  CBG MONITORING, ED    EKG EKG Interpretation  Date/Time:  Saturday Apr 20 2021 22:15:24 EDT Ventricular Rate:  99 PR Interval:  175 QRS Duration: 102 QT Interval:  333 QTC Calculation: 428 R Axis:   31 Text Interpretation: Sinus rhythm mildflattening of T waves but otherwise similar to previous Confirmed by Theotis Burrow [phone]) on 04/20/2021 11:43:44 PM   Radiology CT Cervical Spine Wo Contrast  Result Date: 04/20/2021 CLINICAL DATA:  Neck trauma EXAM: CT CERVICAL SPINE WITHOUT CONTRAST TECHNIQUE: Multidetector CT imaging of the cervical spine was performed without intravenous contrast. Multiplanar CT image reconstructions were also generated. COMPARISON:  07/19/2019 FINDINGS: Alignment: Normal. Skull base and vertebrae: No acute fracture. No primary bone lesion or focal pathologic process. Extensive encephalomalacia within the right cerebral hemisphere, with evidence of previous right  temporal craniotomy, grossly stable since prior head CT. Soft tissues and spinal canal: No prevertebral  fluid or swelling. No visible canal hematoma. Disc levels: Uncovertebral hypertrophy at C3-4 results in symmetrical neural foraminal encroachment. Remaining disc spaces are well preserved. Upper chest: Airway is patent. Visualized portions of the lung apices are clear. Other: Reconstructed images demonstrate no additional findings. IMPRESSION: 1. No acute cervical spine fracture. 2. Stable changes of right temporal craniotomy and right cerebral encephalomalacia. Electronically Signed   By: Randa Ngo M.D.   On: 04/20/2021 22:13   DG Chest Port 1 View  Result Date: 04/21/2021 CLINICAL DATA:  COVID EXAM: PORTABLE CHEST 1 VIEW COMPARISON:  09/21/2019 FINDINGS: Low lung volumes. Patchy ground-glass opacities in the left lower lung. No pleural effusion. Normal cardiac size. No pneumothorax IMPRESSION: Patchy ground-glass opacities in the left lower lung, possible pneumonia Electronically Signed   By: Donavan Foil M.D.   On: 04/21/2021 00:28   CT HEAD CODE STROKE WO CONTRAST  Result Date: 04/20/2021 CLINICAL DATA:  Code stroke.  Initial evaluation for acute stroke. EXAM: CT HEAD WITHOUT CONTRAST TECHNIQUE: Contiguous axial images were obtained from the base of the skull through the vertex without intravenous contrast. COMPARISON:  Prior CT from 11/24/2019 FINDINGS: Brain: Again seen is extensive encephalomalacia of throughout the right cerebral hemisphere related to prior right ACA and MCA distribution infarct. Associated volume loss with ex vacuo dilatation of the right lateral ventricle. Area of heterogeneous density seen subjacent to and overlying right craniotomy measures 6.7 x 1.8 cm (series 2, image 29). Areas of curvilinear hyperdensity seen within this area, not definitely seen on prior, and could reflect acute or subacute blood products. Somewhat nodular hyperdensity at the deep aspect of this area  could reflect dystrophic calcification/mineralization (series 2, image 26). No significant mass effect. No other acute intracranial hemorrhage. No other acute large vessel territory infarct. No other mass lesion or mass effect. No other extra-axial fluid collection. Approximate 9 mm left-to-right shift related to right cerebral volume loss. Vascular: No hyperdense vessel. Skull: Prior right craniotomy. No visible scalp soft tissue abnormality. Sinuses/Orbits: Globes orbital soft tissues demonstrate no acute finding. Paranasal sinuses are clear. No mastoid effusion. Other: None. ASPECTS [facility] Stroke Program Early CT Score) - Ganglionic level infarction (caudate, lentiform nuclei, internal capsule, insula, M1-M3 cortex): 7 - Supraganglionic infarction (M4-M6 cortex): 3 Total score (0-10 with 10 being normal): 10 IMPRESSION: 1. Interval decrease in size of heterogeneous area/collection overlying the right frontal convexity, now measuring 6.7 x 1.8 cm. Nodular hyperdensity at the deep aspect of this area favored to reflect dystrophic calcification. Finding favored to reflect a chronic hematoma. Scattered areas of internal hyperdensity could reflect a small amount of acute to subacute blood products. No mass effect. 2. ASPECTS is 10. 3. Underlying extensive encephalomalacia throughout the right cerebral hemisphere related to prior right ACA/MCA territory infarct. Associated volume loss with left-to-right shift and ex vacuo dilatation of the right lateral ventricle. Critical Value/emergent results were discussed by telephone at the time of interpretation on 04/20/2021 at 10:00 pm to provider Dr. Lorrin Goodell., Who verbally acknowledged these results. Electronically Signed   By: Jeannine Boga M.D.   On: 04/20/2021 22:22    Procedures Procedures  CRITICAL CARE Performed by: Wenda Overland Terilynn Buresh   Total critical care time:30 minutes  Critical care time was exclusive of separately billable procedures and  treating other patients.  Critical care was necessary to treat or prevent imminent or life-threatening deterioration.  Critical care was time spent personally by me on the following activities: development of treatment plan with patient and/or surrogate  as well as nursing, discussions with consultants, evaluation of patient's response to treatment, examination of patient, obtaining history from patient or surrogate, ordering and performing treatments and interventions, ordering and review of laboratory studies, ordering and review of radiographic studies, pulse oximetry and re-evaluation of patient's condition.  Medications Ordered in ED Medications  acetaminophen (TYLENOL) tablet 1,000 mg (1,000 mg Oral Given 04/21/21 0021)    ED Course  I have reviewed the triage vital signs and the nursing notes.  Pertinent labs & imaging results that were available during my care of the patient were reviewed by me and considered in my medical decision making (see chart for details).    MDM Rules/Calculators/A&P                          Patient arrived as a code stroke and was taken immediately to CT scan.  Stroke team, Dr. Lorrin Goodell, present in the ED. obtain CT of head which was negative for hemorrhage but does show a few abnormal areas which could represent small hemorrhage.  CT cervical spine negative for acute injury from his fall.  Neurology team is recommended MRI to evaluate whether this area represents stroke or potentially traumatic bleed.  Meanwhile, patient was later noted to have fever up to 102.  CBC is normal and UA without evidence of infection, some hematuria.  COVID-19 later came back positive which I suspect is the reason for his fever.  I have signed the patient out pending MRI results.  Matthew Black was evaluated in Emergency Department on 04/21/2021 for the symptoms described in the history of present illness. He was evaluated in the context of the global COVID-19 pandemic, which  necessitated consideration that the patient might be at risk for infection with the SARS-CoV-2 virus that causes COVID-19. Institutional protocols and algorithms that pertain to the evaluation of patients at risk for COVID-19 are in a state of rapid change based on information released by regulatory bodies including the CDC and federal and state organizations. These policies and algorithms were followed during the patient's care in the ED.  Final Clinical Impression(s) / ED Diagnoses Final diagnoses:  None    Rx / DC Orders ED Discharge Orders    None       Maham Quintin, Wenda Overland, MD 04/21/21 [phone]

## 2021-04-20 NOTE — ED Notes (Signed)
Mother Ivan Anchors [phone] would like an update

## 2021-04-21 ENCOUNTER — Emergency Department (HOSPITAL_COMMUNITY): Payer: Medicaid Other

## 2021-04-21 ENCOUNTER — Encounter (HOSPITAL_COMMUNITY): Payer: Self-pay | Admitting: Emergency Medicine

## 2021-04-21 ENCOUNTER — Encounter (HOSPITAL_COMMUNITY): Payer: Medicaid Other

## 2021-04-21 DIAGNOSIS — Z86718 Personal history of other venous thrombosis and embolism: Secondary | ICD-10-CM | POA: Diagnosis not present

## 2021-04-21 DIAGNOSIS — Y92009 Unspecified place in unspecified non-institutional (private) residence as the place of occurrence of the external cause: Secondary | ICD-10-CM | POA: Diagnosis not present

## 2021-04-21 DIAGNOSIS — U071 COVID-19: Secondary | ICD-10-CM | POA: Diagnosis not present

## 2021-04-21 DIAGNOSIS — I6389 Other cerebral infarction: Secondary | ICD-10-CM | POA: Diagnosis not present

## 2021-04-21 DIAGNOSIS — M47812 Spondylosis without myelopathy or radiculopathy, cervical region: Secondary | ICD-10-CM | POA: Diagnosis not present

## 2021-04-21 DIAGNOSIS — W109XXA Fall (on) (from) unspecified stairs and steps, initial encounter: Secondary | ICD-10-CM | POA: Diagnosis present

## 2021-04-21 DIAGNOSIS — I69354 Hemiplegia and hemiparesis following cerebral infarction affecting left non-dominant side: Secondary | ICD-10-CM | POA: Diagnosis not present

## 2021-04-21 DIAGNOSIS — I10 Essential (primary) hypertension: Secondary | ICD-10-CM | POA: Diagnosis not present

## 2021-04-21 DIAGNOSIS — I639 Cerebral infarction, unspecified: Secondary | ICD-10-CM

## 2021-04-21 DIAGNOSIS — J1282 Pneumonia due to coronavirus disease 2019: Secondary | ICD-10-CM | POA: Diagnosis not present

## 2021-04-21 DIAGNOSIS — R918 Other nonspecific abnormal finding of lung field: Secondary | ICD-10-CM | POA: Diagnosis not present

## 2021-04-21 DIAGNOSIS — Z86711 Personal history of pulmonary embolism: Secondary | ICD-10-CM | POA: Diagnosis not present

## 2021-04-21 DIAGNOSIS — R531 Weakness: Secondary | ICD-10-CM

## 2021-04-21 DIAGNOSIS — G9389 Other specified disorders of brain: Secondary | ICD-10-CM | POA: Diagnosis not present

## 2021-04-21 DIAGNOSIS — S199XXA Unspecified injury of neck, initial encounter: Secondary | ICD-10-CM | POA: Diagnosis not present

## 2021-04-21 DIAGNOSIS — D6859 Other primary thrombophilia: Secondary | ICD-10-CM | POA: Diagnosis not present

## 2021-04-21 LAB — CBC
HCT: 44.7 % (ref 39.0–52.0)
Hemoglobin: 14.1 g/dL (ref 13.0–17.0)
MCH: 26.3 pg (ref 26.0–34.0)
MCHC: 31.5 g/dL (ref 30.0–36.0)
MCV: 83.2 fL (ref 80.0–100.0)
Platelets: 221 10*3/uL (ref 150–400)
RBC: 5.37 MIL/uL (ref 4.22–5.81)
RDW: 15.1 % (ref 11.5–15.5)
WBC: 6.2 10*3/uL (ref 4.0–10.5)
nRBC: 0 % (ref 0.0–0.2)

## 2021-04-21 LAB — D-DIMER, QUANTITATIVE: D-Dimer, Quant: 1.27 ug/mL-FEU — ABNORMAL HIGH (ref 0.00–0.50)

## 2021-04-21 LAB — CREATININE, SERUM
Creatinine, Ser: 1.18 mg/dL (ref 0.61–1.24)
GFR, Estimated: >60 mL/min (ref >60)

## 2021-04-21 LAB — C-REACTIVE PROTEIN: CRP: 0.6 mg/dL (ref <1.0)

## 2021-04-21 LAB — HIV ANTIBODY (ROUTINE TESTING W REFLEX): HIV Screen 4th Generation wRfx: NONREACTIVE

## 2021-04-21 MED ORDER — SODIUM CHLORIDE 0.9 % IV SOLN: 100.0000 mg | Freq: Every day | INTRAVENOUS | Status: DC

## 2021-04-21 MED ORDER — ZINC SULFATE 220 (50 ZN) MG PO CAPS
220.0000 mg | ORAL_CAPSULE | Freq: Every day | ORAL | Status: DC
  Administered 2021-04-21 – 2021-04-23 (×3): 220 mg via ORAL
  Filled 2021-04-21 (×3): qty 1

## 2021-04-21 MED ORDER — SODIUM CHLORIDE 0.9 % IV SOLN: 200.0000 mg | Freq: Once | INTRAVENOUS | Status: DC

## 2021-04-21 MED ORDER — BACLOFEN 10 MG PO TABS
10.0000 mg | ORAL_TABLET | Freq: Two times a day (BID) | ORAL | Status: DC
  Administered 2021-04-21 – 2021-04-23 (×5): 10 mg via ORAL
  Filled 2021-04-21 (×6): qty 1

## 2021-04-21 MED ORDER — GADOBUTROL 1 MMOL/ML IV SOLN
10.0000 mL | Freq: Once | INTRAVENOUS | Status: AC | PRN
  Administered 2021-04-21: 10 mL via INTRAVENOUS

## 2021-04-21 MED ORDER — SODIUM CHLORIDE 0.9 % IV SOLN
200.0000 mg | Freq: Once | INTRAVENOUS | Status: AC
  Administered 2021-04-21: 200 mg via INTRAVENOUS
  Filled 2021-04-21: qty 40

## 2021-04-21 MED ORDER — ASCORBIC ACID 500 MG PO TABS
500.0000 mg | ORAL_TABLET | Freq: Every day | ORAL | Status: DC
  Administered 2021-04-21 – 2021-04-23 (×3): 500 mg via ORAL
  Filled 2021-04-21 (×3): qty 1

## 2021-04-21 MED ORDER — ATORVASTATIN CALCIUM 10 MG PO TABS
10.0000 mg | ORAL_TABLET | Freq: Every day | ORAL | Status: DC
  Administered 2021-04-21 – 2021-04-23 (×3): 10 mg via ORAL
  Filled 2021-04-21 (×3): qty 1

## 2021-04-21 MED ORDER — ENOXAPARIN SODIUM 60 MG/0.6ML IJ SOSY
60.0000 mg | PREFILLED_SYRINGE | INTRAMUSCULAR | Status: DC
  Administered 2021-04-21 – 2021-04-23 (×3): 60 mg via SUBCUTANEOUS
  Filled 2021-04-21 (×5): qty 0.6

## 2021-04-21 MED ORDER — METOPROLOL TARTRATE 50 MG PO TABS
50.0000 mg | ORAL_TABLET | Freq: Every day | ORAL | Status: DC
  Administered 2021-04-21 – 2021-04-23 (×3): 50 mg via ORAL
  Filled 2021-04-21 (×2): qty 1
  Filled 2021-04-21: qty 2

## 2021-04-21 MED ORDER — LACTATED RINGERS IV SOLN: INTRAVENOUS | Status: DC

## 2021-04-21 MED ORDER — ENSURE ENLIVE PO LIQD
237.0000 mL | Freq: Two times a day (BID) | ORAL | Status: DC
  Administered 2021-04-23 (×2): 237 mL via ORAL
  Filled 2021-04-21: qty 237

## 2021-04-21 MED ORDER — SODIUM CHLORIDE 0.9 % IV SOLN
100.0000 mg | Freq: Every day | INTRAVENOUS | Status: AC
  Administered 2021-04-22 – 2021-04-23 (×2): 100 mg via INTRAVENOUS
  Filled 2021-04-21 (×4): qty 20

## 2021-04-21 MED ORDER — ACETAMINOPHEN 325 MG PO TABS
650.0000 mg | ORAL_TABLET | Freq: Four times a day (QID) | ORAL | Status: DC | PRN
  Administered 2021-04-22: 650 mg via ORAL
  Filled 2021-04-21: qty 2

## 2021-04-21 MED ORDER — ASPIRIN EC 81 MG PO TBEC
81.0000 mg | DELAYED_RELEASE_TABLET | Freq: Every day | ORAL | Status: DC
  Administered 2021-04-21 – 2021-04-23 (×3): 81 mg via ORAL
  Filled 2021-04-21 (×3): qty 1

## 2021-04-21 NOTE — Evaluation (Signed)
Clinical/Bedside Swallow Evaluation Patient Details  Name: Matthew Black MRN: [id number] Date of Birth: 1976-01-10  Today's Date: 04/21/2021 Time: SLP Start Time (ACUTE ONLY): 1500 SLP Stop Time (ACUTE ONLY): 1510 SLP Time Calculation (min) (ACUTE ONLY): 10 min  Past Medical History:  Past Medical History:  Diagnosis Date  . 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 Depression   . DVT (deep venous thrombosis) ([place])    BLE DVT 07/21/19, 08/02/19; s/p retrievable IVC filter 07/21/19  . Hemorrhagic stroke ([place])   . History of kidney stones   . Hypertension   . 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 PE (pulmonary thromboembolism) ([place])    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  . Stroke [facility])    RICA, R A1, R MCA occlusion 07/19/19   Past Surgical History:  Past Surgical History:  Procedure Laterality Date  . CRANIOPLASTY Right 06/05/2020   Procedure: CRANIOPLASTY;  Surgeon: Consuella Lose, MD;  Location: [place];  Service: Neurosurgery;  Laterality: Right;  right  . CRANIOTOMY Right 07/19/2019   Procedure: RIGHT HEMI-CRANIECTOMY With implantation of skull flap to abdominal wall;  Surgeon: Consuella Lose, MD;  Location: [place];  Service: Neurosurgery;  Laterality: Right;  . CYST EXCISION N/A 10/08/2016   Procedure: EXCISION OF POSTERIOR NECK CYST;  Surgeon: Clovis Riley, MD;  [facility];  Service: General;  Laterality: N/A;  . CYSTOSCOPY/URETEROSCOPY/HOLMIUM LASER/STENT PLACEMENT Right 03/16/2020   Procedure: CYSTOSCOPY RIGHT RETROGRADE PYELOGRAM URETEROSCOPY/HOLMIUM LASER/STENT PLACEMENT;  Surgeon: Lucas Mallow, MD;  [facility];  Service: Urology;  Laterality: Right;  . INCISION AND DRAINAGE ABSCESS N/A 09/22/2014   Procedure: INCISION AND DRAINAGE ABSCESS POSTERIOR NECK;  Surgeon: Pedro Earls, MD;  [facility];  Service: General;  Laterality: N/A;  . INCISION AND DRAINAGE ABSCESS N/A 12/20/2015   Procedure: INCISION AND DRAINAGE POSTERIOR  NECK MASS;  Surgeon: Armandina Gemma, MD;  [facility];  Service: General;  Laterality: N/A;  . INCISION AND DRAINAGE ABSCESS Left 07/10/2004   middle finger  . IR IVC FILTER PLMT / S&I /IMG GUID/MOD SED  07/21/2019  . IR RADIOLOGIST EVAL & MGMT  12/14/2019  . IR RADIOLOGIST EVAL & MGMT  12/21/2020  . IR VENOGRAM RENAL UNI RIGHT  07/21/2019  . MASS EXCISION N/A 07/21/2017   Procedure: EXCISION OF BENIGN NECK LESION WITH LAYERED CLOSURE;  Surgeon: Irene Limbo, MD;  Location: [place];  Service: Plastics;  Laterality: N/A;  . MASS EXCISION N/A 11/10/2017   Procedure: EXCISION BENIGN LESION OF THE NECK WITH LAYERED CLOSURE;  Surgeon: Irene Limbo, MD;  Location: [place];  Service: Plastics;  Laterality: N/A;   HPI:  Matthew Black is a 45 y.o. male with medical history significant for hx of CVA, hypercoagulable state, history of PEs and DVT, hypertension.  Patient had previous right MCA stroke with decompressive hemicraniotomy was complicated by hemorrhagic conversion with hematoma with residual left arm plegia and left leg weakness.  He presented to the emergency room after having a fall down some stairs at home. Acute stroke ruled out. Patient also has evidence of lower lobe pneumonia on x-ray.   Assessment / Plan / Recommendation Clinical Impression  Pt demonstrates no signs of dysphagia or aspiration with his lunch meal. Denies any recent difficulty. Recommend pt continue current diet, no f/u needed. SLP Visit Diagnosis: Dysphagia, unspecified (R13.10)    Aspiration Risk  Mild aspiration risk    Diet Recommendation  Regular;Thin liquid   Liquid Administration via: Cup;Straw Medication Administration: Whole meds with liquid Supervision: Patient able to self feed    Other  Recommendations     Follow up Recommendations None      Frequency and Duration            Prognosis        Swallow Study   General HPI: Matthew Black is a 45 y.o.  male with medical history significant for hx of CVA, hypercoagulable state, history of PEs and DVT, hypertension.  Patient had previous right MCA stroke with decompressive hemicraniotomy was complicated by hemorrhagic conversion with hematoma with residual left arm plegia and left leg weakness.  He presented to the emergency room after having a fall down some stairs at home. Acute stroke ruled out. Patient also has evidence of lower lobe pneumonia on x-ray. Type of Study: Bedside Swallow Evaluation Previous Swallow Assessment: multiple MBS in 2020 Diet Prior to this Study: Regular;Thin liquids Temperature Spikes Noted: No Respiratory Status: Room air History of Recent Intubation: No Behavior/Cognition: Alert Oral Cavity Assessment: Within Functional Limits Oral Care Completed by SLP: No Oral Cavity - Dentition: Adequate natural dentition Self-Feeding Abilities: Able to feed self Patient Positioning: Upright in bed Baseline Vocal Quality: Normal Volitional Cough: Strong Volitional Swallow: Able to elicit    Oral/Motor/Sensory Function Overall Oral Motor/Sensory Function: Within functional limits   Ice Chips     Thin Liquid Thin Liquid: Within functional limits    Nectar Thick Nectar Thick Liquid: Not tested   Honey Thick Honey Thick Liquid: Not tested   Puree Puree: Within functional limits   Solid     Solid: Within functional limits      Matthew Black, Katherene Ponto 04/21/2021,3:15 PM

## 2021-04-21 NOTE — H&P (Signed)
History and Physical    Matthew Black MEQ:[id number] DOB: Apr 29, 1976 DOA: 04/20/2021  PCP: Mackie Pai, PA-C   Patient coming from:  Home  Chief Complaint: weakness of legs, fall at home  HPI: Matthew Black is a 45 y.o. male with medical history significant for hx of CVA, hypercoagulable state, history of PEs and DVT, hypertension.  Patient had previous right MCA stroke with decompressive hemicraniotomy was complicated by hemorrhagic conversion with hematoma with residual left arm plegia and left leg weakness.  He presented to the emergency room after having a fall down some stairs at home.  He reports he hit the right side of his head but did not have any loss of consciousness.  States he was walking down the steps when his legs became very weak and gave out on him.  He reports after the fall his left leg continued to feel weaker than normal prompting him to come to the emergency room.  Usually warms up his legs he states prior to getting up and walking which he did and he did not feel weak when he was doing that.  States he has not had any fever or chills.  He denies any chest pain or palpitations.  He has not had congestion, cough, shortness of breath.  Reports he has been vaccinated for COVID.  ED Course: In the emergency room has been hemodynamically stable.  He had a CT of his neck that showed no acute fracture.  He had a head CT which showed hypodensity concerning for dystrophic calcification and some scattered internal hyperdensity which may be acute on subacute blood products radiology report.  Patient has been seen by neurology.  Patient is not a tPA candidate due to history of ICH.  RI has been obtained and results are pending.  Blood work shows normal CBC, normal electrolytes and renal function.  Review of Systems:  General: Denies fever, chills, weight loss, night sweats.  Denies dizziness.  Denies change in appetite HENT: Denies head trauma, headache, denies change in hearing,  tinnitus.  Denies nasal congestion or bleeding.  Denies sore throat, sores in mouth.  Denies difficulty swallowing Eyes: Denies blurry vision, pain in eye, drainage.  Denies discoloration of eyes. Neck: Denies pain.  Denies swelling.  Denies pain with movement. Cardiovascular: Denies chest pain, palpitations.  Denies edema.  Denies orthopnea Respiratory: Denies shortness of breath, cough.  Denies wheezing.  Denies sputum production Gastrointestinal: Denies abdominal pain, swelling.  Denies nausea, vomiting, diarrhea.  Denies melena.  Denies hematemesis. Musculoskeletal: Denies limitation of movement.  Denies deformity or swelling.  Denies pain.  Denies arthralgias or myalgias. Genitourinary: Denies pelvic pain.  Denies urinary frequency or hesitancy.  Denies dysuria.  Skin: Denies rash.  Denies petechiae, purpura, ecchymosis. Neurological: Reports weakness of legs. Denies headache.  Denies syncope.  Denies seizure activity.  Denies slurred speech, drooping face.  Denies visual change. Psychiatric: Denies depression, anxiety. Denies hallucinations.  Past Medical History:  Diagnosis Date  . 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 Depression   . DVT (deep venous thrombosis) ([place])    BLE DVT 07/21/19, 08/02/19; s/p retrievable IVC filter 07/21/19  . Hemorrhagic stroke ([place])   . History of kidney stones   . Hypertension   . 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 PE (pulmonary thromboembolism) ([name])    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  . Stroke [facility])    RICA, R A1, R MCA occlusion 07/19/19    Past  Surgical History:  Procedure Laterality Date  . CRANIOPLASTY Right 06/05/2020   Procedure: CRANIOPLASTY;  Surgeon: Consuella Lose, MD;  Location: [place];  Service: Neurosurgery;  Laterality: Right;  right  . CRANIOTOMY Right 07/19/2019   Procedure: RIGHT HEMI-CRANIECTOMY With implantation of skull flap to abdominal wall;  Surgeon: Consuella Lose, MD;  Location: [place];   Service: Neurosurgery;  Laterality: Right;  . CYST EXCISION N/A 10/08/2016   Procedure: EXCISION OF POSTERIOR NECK CYST;  Surgeon: Clovis Riley, MD;  Location: [facility];  Service: General;  Laterality: N/A;  . CYSTOSCOPY/URETEROSCOPY/HOLMIUM LASER/STENT PLACEMENT Right 03/16/2020   Procedure: CYSTOSCOPY RIGHT RETROGRADE PYELOGRAM URETEROSCOPY/HOLMIUM LASER/STENT PLACEMENT;  Surgeon: Lucas Mallow, MD;  Location: [facility];  Service: Urology;  Laterality: Right;  . INCISION AND DRAINAGE ABSCESS N/A 09/22/2014   Procedure: INCISION AND DRAINAGE ABSCESS POSTERIOR NECK;  Surgeon: Pedro Earls, MD;  Location: [facility];  Service: General;  Laterality: N/A;  . INCISION AND DRAINAGE ABSCESS N/A 12/20/2015   Procedure: INCISION AND DRAINAGE POSTERIOR NECK MASS;  Surgeon: Armandina Gemma, MD;  Location: [facility];  Service: General;  Laterality: N/A;  . INCISION AND DRAINAGE ABSCESS Left 07/10/2004   middle finger  . IR IVC FILTER PLMT / S&I /IMG GUID/MOD SED  07/21/2019  . IR RADIOLOGIST EVAL & MGMT  12/14/2019  . IR RADIOLOGIST EVAL & MGMT  12/21/2020  . IR VENOGRAM RENAL UNI RIGHT  07/21/2019  . MASS EXCISION N/A 07/21/2017   Procedure: EXCISION OF BENIGN NECK LESION WITH LAYERED CLOSURE;  Surgeon: Irene Limbo, MD;  Location: [place];  Service: Plastics;  Laterality: N/A;  . MASS EXCISION N/A 11/10/2017   Procedure: EXCISION BENIGN LESION OF THE NECK WITH LAYERED CLOSURE;  Surgeon: Irene Limbo, MD;  Location: [place];  Service: Plastics;  Laterality: N/A;    Social History  reports that he quit smoking about 5 years ago. He smoked 0.00 packs per day. He has never used smokeless tobacco. He reports current alcohol use. He reports that he does not use drugs.  Allergies  Allergen Reactions  . Hydrocodone Nausea Only    Family History  Problem Relation Age of Onset  . Healthy Mother   . Healthy Father   . Clotting disorder Maternal Grandmother   .  Diabetes Paternal Grandfather   . Clotting disorder Maternal Aunt   . Clotting disorder Maternal Uncle   . Colon cancer Neg Hx      Prior to Admission medications   Medication Sig Start Date End Date Taking? Authorizing Provider  aspirin EC 81 MG tablet Take 1 tablet (81 mg total) by mouth daily. Swallow whole. 01/30/21  Yes Garvin Fila, MD  atorvastatin (LIPITOR) 10 MG tablet TAKE 1 TABLET BY MOUTH EVERY DAY Patient taking differently: Take 10 mg by mouth daily. 03/21/21  Yes Saguier, Percell Miller, PA-C  baclofen (LIORESAL) 10 MG tablet Take 1 tablet (10 mg total) by mouth 2 (two) times daily. 11/20/20  Yes Saguier, Percell Miller, PA-C  metoprolol tartrate (LOPRESSOR) 50 MG tablet Take 1 tablet (50 mg total) by mouth daily. 03/21/21  Yes Raulkar, Clide Deutscher, MD  venlafaxine XR (EFFEXOR-XR) 37.5 MG 24 hr capsule TAKE 1 CAPSULE BY MOUTH DAILY WITH BREAKFAST. Patient not taking: Reported on 04/21/2021 12/17/20   Mackie Pai, PA-C    Physical Exam: Vitals:   04/21/21 0458 04/21/21 0500 04/21/21 0530 04/21/21 0545  BP:  129/83 128/84 133/86  Pulse: 77 84 76 90  Resp: Marland Kitchen)  22 (!) 24 (!) 23 (!) 27  Temp:      TempSrc:      SpO2: 99% 100% 100% 99%  Weight:        Constitutional: NAD, calm, comfortable Vitals:   04/21/21 0458 04/21/21 0500 04/21/21 0530 04/21/21 0545  BP:  129/83 128/84 133/86  Pulse: 77 84 76 90  Resp: (!) 22 (!) 24 (!) 23 (!) 27  Temp:      TempSrc:      SpO2: 99% 100% 100% 99%  Weight:       General: WDWN, Alert and oriented x3.  Eyes: EOMI, PERRL, conjunctivae normal.  Sclera nonicteric HENT:  [place]/AT, external ears normal.  Nares patent without epistasis.  Mucous membranes are moist. Neck: Soft, normal range of motion, supple, no masses, no thyromegaly.  Trachea midline Respiratory: clear to auscultation bilaterally, no wheezing, no crackles. Normal respiratory effort. No accessory muscle use.  Cardiovascular: Regular rate and rhythm, no murmurs / rubs / gallops. No  extremity edema. 2+ pedal pulses. No carotid bruits appreciated. Abdomen: Soft, no tenderness, nondistended, no rebound or guarding.  No masses palpated. Bowel sounds normoactive Musculoskeletal: FROM of right arm and right leg. no cyanosis. No joint deformity upper and lower extremities. Has contracture of left arm/hand. Skin: Warm, dry, intact no rashes, lesions, ulcers. No induration Neurologic: CN 2-12 grossly intact.  No asymetry, normal extension of tongue without deviation.Slow but normal speech.  Sensation intact. Strength 4/5 in right sided extremities. Strength 0 in left arm and hand, 3/5 left lower extremity.  Normal heel to shin on right. Normal finger to nose on right.  Psychiatric: Normal judgment and insight.  Normal mood.    Labs on Admission: I have personally reviewed following labs and imaging studies  CBC: Recent Labs  Lab 04/20/21 2140 04/20/21 2142  WBC 8.7  --   NEUTROABS 5.1  --   HGB 14.8 15.6  HCT 46.2 46.0  MCV 82.9  --   PLT 226  --     Basic Metabolic Panel: Recent Labs  Lab 04/20/21 2140 04/20/21 2142  NA 139 141  K 3.6 3.6  CL 107 107  CO2 25  --   GLUCOSE 99 97  BUN 10 10  CREATININE 1.41* 1.40*  CALCIUM 9.0  --     GFR: Estimated Creatinine Clearance: 95.7 mL/min (A) (by C-G formula based on SCr of 1.4 mg/dL (H)).  Liver Function Tests: Recent Labs  Lab 04/20/21 2140  AST 28  ALT 28  ALKPHOS 116  BILITOT 0.5  PROT 7.6  ALBUMIN 3.7    Urine analysis:    Component Value Date/Time   COLORURINE YELLOW 04/20/2021 2140   APPEARANCEUR HAZY (A) 04/20/2021 2140   LABSPEC 1.016 04/20/2021 2140   PHURINE 7.0 04/20/2021 2140   GLUCOSEU NEGATIVE 04/20/2021 2140   HGBUR MODERATE (A) 04/20/2021 2140   BILIRUBINUR NEGATIVE 04/20/2021 2140   [place] NEGATIVE 04/20/2021 2140   PROTEINUR NEGATIVE 04/20/2021 2140   NITRITE NEGATIVE 04/20/2021 2140   LEUKOCYTESUR NEGATIVE 04/20/2021 2140    Radiological Exams on Admission: CT  Cervical Spine Wo Contrast  Result Date: 04/20/2021 CLINICAL DATA:  Neck trauma EXAM: CT CERVICAL SPINE WITHOUT CONTRAST TECHNIQUE: Multidetector CT imaging of the cervical spine was performed without intravenous contrast. Multiplanar CT image reconstructions were also generated. COMPARISON:  07/19/2019 FINDINGS: Alignment: Normal. Skull base and vertebrae: No acute fracture. No primary bone lesion or focal pathologic process. Extensive encephalomalacia within the right cerebral hemisphere, with evidence  of previous right temporal craniotomy, grossly stable since prior head CT. Soft tissues and spinal canal: No prevertebral fluid or swelling. No visible canal hematoma. Disc levels: Uncovertebral hypertrophy at C3-4 results in symmetrical neural foraminal encroachment. Remaining disc spaces are well preserved. Upper chest: Airway is patent. Visualized portions of the lung apices are clear. Other: Reconstructed images demonstrate no additional findings. IMPRESSION: 1. No acute cervical spine fracture. 2. Stable changes of right temporal craniotomy and right cerebral encephalomalacia. Electronically Signed   By: Randa Ngo M.D.   On: 04/20/2021 22:13   DG Chest Port 1 View  Result Date: 04/21/2021 CLINICAL DATA:  COVID EXAM: PORTABLE CHEST 1 VIEW COMPARISON:  09/21/2019 FINDINGS: Low lung volumes. Patchy ground-glass opacities in the left lower lung. No pleural effusion. Normal cardiac size. No pneumothorax IMPRESSION: Patchy ground-glass opacities in the left lower lung, possible pneumonia Electronically Signed   By: Donavan Foil M.D.   On: 04/21/2021 00:28   CT HEAD CODE STROKE WO CONTRAST  Result Date: 04/20/2021 CLINICAL DATA:  Code stroke.  Initial evaluation for acute stroke. EXAM: CT HEAD WITHOUT CONTRAST TECHNIQUE: Contiguous axial images were obtained from the base of the skull through the vertex without intravenous contrast. COMPARISON:  Prior CT from 11/24/2019 FINDINGS: Brain: Again seen is  extensive encephalomalacia of throughout the right cerebral hemisphere related to prior right ACA and MCA distribution infarct. Associated volume loss with ex vacuo dilatation of the right lateral ventricle. Area of heterogeneous density seen subjacent to and overlying right craniotomy measures 6.7 x 1.8 cm (series 2, image 29). Areas of curvilinear hyperdensity seen within this area, not definitely seen on prior, and could reflect acute or subacute blood products. Somewhat nodular hyperdensity at the deep aspect of this area could reflect dystrophic calcification/mineralization (series 2, image 26). No significant mass effect. No other acute intracranial hemorrhage. No other acute large vessel territory infarct. No other mass lesion or mass effect. No other extra-axial fluid collection. Approximate 9 mm left-to-right shift related to right cerebral volume loss. Vascular: No hyperdense vessel. Skull: Prior right craniotomy. No visible scalp soft tissue abnormality. Sinuses/Orbits: Globes orbital soft tissues demonstrate no acute finding. Paranasal sinuses are clear. No mastoid effusion. Other: None. ASPECTS [facility] Stroke Program Early CT Score) - Ganglionic level infarction (caudate, lentiform nuclei, internal capsule, insula, M1-M3 cortex): 7 - Supraganglionic infarction (M4-M6 cortex): 3 Total score (0-10 with 10 being normal): 10 IMPRESSION: 1. Interval decrease in size of heterogeneous area/collection overlying the right frontal convexity, now measuring 6.7 x 1.8 cm. Nodular hyperdensity at the deep aspect of this area favored to reflect dystrophic calcification. Finding favored to reflect a chronic hematoma. Scattered areas of internal hyperdensity could reflect a small amount of acute to subacute blood products. No mass effect. 2. ASPECTS is 10. 3. Underlying extensive encephalomalacia throughout the right cerebral hemisphere related to prior right ACA/MCA territory infarct. Associated volume loss with  left-to-right shift and ex vacuo dilatation of the right lateral ventricle. Critical Value/emergent results were discussed by telephone at the time of interpretation on 04/20/2021 at 10:00 pm to provider Dr. Lorrin Goodell., Who verbally acknowledged these results. Electronically Signed   By: Jeannine Boga M.D.   On: 04/20/2021 22:22    EKG: Independently reviewed.  EKG shows normal sinus rhythm with no acute ST elevation or depression.  QTc 428  Assessment/Plan Principal Problem:   Acute CVA (cerebrovascular accident)  Mr. Trueba will be admitted to medical telemetry floor for CVA.  MRI of brain has been  obtained in ER and results pending.  Will evaluate carotids for large vessel occlusion/stenosis. CT neck negative for fracture after fall at home. Hypertension of 220/110 will be allowed for 24 hours per stroke protocol.  After which blood pressure will be slowly reduced to goal level. Antiplatelet therapy with aspirin daily. Continue statin therapy.  Check lipid panel.  Neurochecks per stroke protocol  Mr. Aungst is not a candidate for TPA with hx of ICH.  Consult PT/ST in am.  Active Problems:   COVID-19 virus infection Pt with no respiratory complaints. Mr. Radler has been vaccinated for Covid.  Started on Remdisivir as he has high risk of clinical deterioration from Covid with his medical history.  Pt does not need solumedrol as not requiring oxygen at this time.     Essential hypertension Continue home medications. Monitor BP.     Left-sided weakness Chronic since stroke a few years ago but worse since yesterday he states. Consult PT    Primary hypercoagulable state  Per chart has hx of hypercoaguable condition but not on anticoagulation at home.     History of cranioplasty      DVT prophylaxis: Lovenox for DVT prophylaxis  Code Status:   Full  code  Family Communication:  Diagnosis and plan with patient.  He verbalized understanding agrees with plan.  Further  recommendations to follow as clinical indicated Disposition Plan:   Patient is from:  Home  Anticipated DC to:  Home  Anticipated DC date:  Anticipate 2 midnight or more stay in the hospital to treat acute condition  Anticipated DC barriers: No barriers to discharge identified at this time  Consults called:  Neurology, seen by Dr. Lorrin Goodell in ER Admission status:  Inpatient   Yevonne Aline Schawn Byas MD [facility]  How to contact the [facility] Attending or Consulting provider [place] or covering provider during after hours [place], for this patient?   1. Check the care team in [facility] and look for a) attending/consulting [facility] provider listed and b) the [facility] team listed 2. Log into [email or web address] and use [place]'s universal password to access. If you do not have the password, please contact the hospital operator. 3. Locate the [facility] provider you are looking for under [facility] and page to a number that you can be directly reached. 4. If you still have difficulty reaching the provider, please page the [facility] (Director on Call) for the Hospitalists listed on amion for assistance.  04/21/2021, 6:00 AM

## 2021-04-21 NOTE — ED Notes (Signed)
Patient transported to MRI 

## 2021-04-21 NOTE — ED Notes (Signed)
RN attempted report x2 

## 2021-04-21 NOTE — ED Notes (Signed)
Ordered breakfast tray  

## 2021-04-21 NOTE — Progress Notes (Signed)
Patient was seen and examined.  Early morning hours admission by nighttime hospitalist.  Patient has chronic left hemiplegia upper extremity more than left lower extremity.  He suffered from cough congestion and URI symptoms for last few days, fell at home yesterday.  CT scan was questionable with new hemorrhage, however MRI is stable.  Patient also has evidence of lower lobe pneumonia on x-ray.  Plan: Acute stroke ruled out.  Mobilize with PT OT.  Profound weakness probably aggravated by COVID-19 infection with underlying left hemiplegia. Agree with remdesivir for 3 days given severity of symptoms and presence of pneumonia on chest x-ray. Work with PT OT. If adequately stabilized, may go home tomorrow with outpatient therapies.  No charge visit.

## 2021-04-21 NOTE — ED Notes (Addendum)
Pt returned from [place] assessed and vitals obtained

## 2021-04-21 NOTE — Progress Notes (Signed)
MRI without acute findings.  I suspect that his worsening symptoms are due to recrudescence in the setting of fever.  Neurology will be available on an as-needed basis moving forward, please call with further questions or concerns.  Roland Rack, MD [facility] [phone]  If 7pm- 7am, please page neurology on call as listed in [place].

## 2021-04-21 NOTE — ED Notes (Signed)
Pt remains in MRI at this time  

## 2021-04-21 NOTE — ED Notes (Signed)
RN attempted report x1.  

## 2021-04-22 DIAGNOSIS — I639 Cerebral infarction, unspecified: Secondary | ICD-10-CM | POA: Diagnosis not present

## 2021-04-22 LAB — GLUCOSE, CAPILLARY: Glucose-Capillary: 115 mg/dL — ABNORMAL HIGH (ref 70–99)

## 2021-04-22 NOTE — Evaluation (Signed)
Physical Therapy Evaluation Patient Details Name: Matthew Black MRN: [id number] DOB: 1976/06/10 Today's Date: 04/22/2021   History of Present Illness  Pt is 45 yo male admitted after fall at home.  Reports he suddenly became weak and lost his balance.  Pt found to be COVID 19 positive with bil lower lobe infiltrates.  Acute stroke ruled out. 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  Clinical Impression   Pt admitted with above diagnosis. Pt with hx of L hemiplegia with weakness worsened believed to be from current COVID infection.  Normally at home, he has assist for lower body ADLs and can ambulate short community distances with his quad cane and AFO.  He was working with outpt PT.  Today, pt had AFO but did not have quad cane.  He was able to ambulate some in room using bed rail on R similar to one sided parallel bar set up. He was unsteady requiring min guard for safety, may do better with quad cane to simulate home set up.  Mod A bed mobility but suspect related to coming out on opposite of bed. VSS.  Pt is slow to respond but alert and oriented and following commands.  He has L LE weakness with increased tone.  Pt reports he feels 90% at his baseline. Pt currently with functional limitations due to the deficits listed below (see PT Problem List). Pt will benefit from skilled PT to increase their independence and safety with mobility to allow discharge to the venue listed below.  Pt does have stairs to enter home.  Needs further PT in acute care prior to d/c home to assess stairs and ambulation with quad cane.      Follow Up Recommendations Supervision for mobility/OOB;Other (comment);Home health PT (with transition back to outpt PT)    Equipment Recommendations  Other (comment) (has DME)    Recommendations for Other Services       Precautions / Restrictions Precautions Precautions: Fall       Mobility  Bed Mobility Overal bed mobility: Needs Assistance Bed Mobility: Supine to Sit     Supine to sit: Mod assist     General bed mobility comments: Mod A to lift trunk to come off L side of bed - pt reports normally goes to R so that he can push up with R UE    Transfers Overall transfer level: Needs assistance Equipment used:  (bed rail) Transfers: Sit to/from Stand Sit to Stand: Min guard         General transfer comment: Performed with min guard for safety; did not need physical assist  Ambulation/Gait Ambulation/Gait assistance: Min guard Gait Distance (Feet): 8 Feet Assistive device:  (bed rail) Gait Pattern/deviations: Step-to pattern;Decreased stride length;Decreased dorsiflexion - left;Decreased weight shift to left Gait velocity: decreased   General Gait Details: Pt did not have his quad cane, but was able to position bed rails so that pt could ambulate around footboard then get side rails on R (similar to parallel bars) to get to chair.  Min guard for safety but did not need physical assist.  He did have some unsteadiness and difficulty advancing L LE initially.  Wore AFO.  Stairs            Wheelchair Mobility    Modified Rankin (Stroke Patients Only)       Balance Overall balance assessment: Needs assistance Sitting-balance support: No upper extremity supported  Sitting balance-Leahy Scale: Good     Standing balance support: Single extremity supported Standing balance-Leahy Scale: Poor Standing balance comment: Required UE support but no LOB with static stand                             Pertinent Vitals/Pain Pain Assessment: No/denies pain    Home Living Family/patient expects to be discharged to:: Private residence Living Arrangements: Spouse/significant other (kids are 64 and 52 yo) Available Help at Discharge: Family;Available PRN/intermittently Type of Home: House Home Access: Stairs to enter;Ramped  entrance Entrance Stairs-Rails: None Entrance Stairs-Number of Steps: 3 Home Layout: Two level;Bed/bath upstairs;1/2 bath on main level (hospital bed on main level) Home Equipment: Walker - 2 wheels;Wheelchair - manual;Hospital bed;Cane - quad      Prior Function Level of Independence: Needs assistance   Gait / Transfers Assistance Needed: Walking with quad cane - short community distances  ADL's / Homemaking Assistance Needed: Independent with ADLs except wife assist with socks/shoes; reports does sponge baths        Hand Dominance   Dominant Hand: Right    Extremity/Trunk Assessment   Upper Extremity Assessment Upper Extremity Assessment: Defer to OT evaluation    Lower Extremity Assessment Lower Extremity Assessment: LLE deficits/detail;RLE deficits/detail RLE Deficits / Details: WFL LLE Deficits / Details: Chronic L hemiplegia and wears AFO.  Noting grossly 1/5 strength throughout with increased tone and accessory movements.    Cervical / Trunk Assessment Cervical / Trunk Assessment: Normal  Communication   Communication: Expressive difficulties (slowed speech)  Cognition Arousal/Alertness: Awake/alert Behavior During Therapy: WFL for tasks assessed/performed Overall Cognitive Status: Within Functional Limits for tasks assessed                                 General Comments: Overall cognition was functional.  He was A and O x 4.  He was slower to respond - likely from prior CVA.      General Comments General comments (skin integrity, edema, etc.): Pt reports hopeful to go home and resume outpt PT.  States does not want to lose what he has gained.  He does state that he feels 90% at his baseline.    Exercises     Assessment/Plan    PT Assessment Patient needs continued PT services  PT Problem List Decreased strength;Decreased mobility;Decreased safety awareness;Impaired tone;Decreased range of motion;Decreased activity tolerance;Decreased  balance;Decreased knowledge of use of DME       PT Treatment Interventions DME instruction;Therapeutic activities;Gait training;Therapeutic exercise;Patient/family education;Stair training;Balance training;Functional mobility training;Wheelchair mobility training    PT Goals (Current goals can be found in the Care Plan section)  Acute Rehab PT Goals Patient Stated Goal: return home PT Goal Formulation: With patient Time For Goal Achievement: 05/06/21 Potential to Achieve Goals: Good    Frequency Min 3X/week   Barriers to discharge Inaccessible home environment 3 steps to enter home    Co-evaluation PT/OT/SLP Co-Evaluation/Treatment: Yes Reason for Co-Treatment: For patient/therapist safety PT goals addressed during session: Mobility/safety with mobility OT goals addressed during session: ADL's and self-care       AM-PAC PT "6 Clicks" Mobility  Outcome Measure Help needed turning from your back to your side while in a flat bed without using bedrails?: A Little Help needed moving from lying on your back to sitting on the side of a flat bed without using bedrails?: A Little  Help needed moving to and from a bed to a chair (including a wheelchair)?: A Little Help needed standing up from a chair using your arms (e.g., wheelchair or bedside chair)?: A Little Help needed to walk in hospital room?: A Little Help needed climbing 3-5 steps with a railing? : A Lot 6 Click Score: 17    End of Session Equipment Utilized During Treatment: Gait belt Activity Tolerance: Patient tolerated treatment well Patient left: in chair;with call bell/phone within reach (chair alarm pad in place but no box in room (checked nightstand)) Nurse Communication: Mobility status;Other (comment) (Recommended pivots toward R side.  Also, notified no chair alarm box - RN and tech both report pt A and O and following commands, calls for assist. PT did find box at station after eval - RN/Tech will hook up next time  in room) PT Visit Diagnosis: Hemiplegia and hemiparesis;Unsteadiness on feet (R26.81);Muscle weakness (generalized) (M62.81) Hemiplegia - Right/Left: Left Hemiplegia - dominant/non-dominant: Non-dominant Hemiplegia - caused by:  (chronic R MCA with hemorrhagic conversion)    Time: 2787-1836 PT Time Calculation (min) (ACUTE ONLY): 30 min   Charges:   PT Evaluation $PT Eval Moderate Complexity: 1 Melina Schools, PT Acute Rehab Services Pager [phone] [facility] [phone]    Karlton Lemon 04/22/2021, 3:39 PM

## 2021-04-22 NOTE — Progress Notes (Signed)
Per night shift RN, patient refused AM lab draw.

## 2021-04-22 NOTE — Progress Notes (Signed)
PROGRESS NOTE    Matthew Black  HGD:[id number] DOB: February 05, 1976 DOA: 04/20/2021 PCP: Mackie Pai, PA-C    Brief Narrative:  45 year old gentleman with history of stroke, hypercoagulable state, history of pulmonary embolism and DVT not on anticoagulation due to right MCA stroke with decompressive hemicraniotomy and complicated hemorrhagic conversion with hematoma, chronic left arm hemiplegia and left leg weakness presented to the emergency room with fall at home.  He receives outpatient rehab.  He became weak than usual and lost his balance.  He did have URI symptoms for the last few days.  He is vaccinated against COVID-19. In the emergency room hemodynamically stable.  COVID-19 positive.  Bilateral lower lobe infiltrates.  On room air.  Initial CT head with old stroke.  MRI with old stroke no new stroke.   Assessment & Plan:   Principal Problem:   Acute CVA (cerebrovascular accident) [facility]) Active Problems:   Essential hypertension   Primary hypercoagulable state ([place])   History of cranioplasty   COVID-19 virus infection   Left-sided weakness  Pneumonia due to COVID-19 virus: Patient is fairly stable today.  He does have evidence of pneumonia on his chest x-ray.  Hemoglobin markers are normalizing. Patient was a started on remdesivir, because of underlying severe comorbidities will treat with 3 days of remdesivir therapy. Other symptomatic treatment. Start mobilizing with therapies today.  Acute worsening neurological symptoms with history of left hemiplegia: Acute a stroke was ruled out.  Profound weakness probably aggravated by viral infection.  Work with PT OT today.  Can presume outpatient rehab. Patient is already on aspirin, Lipitor and metoprolol.  Essential hypertension: Blood pressures are stable.  Resume home medications.   DVT prophylaxis: SCDs.   Code Status: Full code. Family Communication: Called patient's wife multiple times, unable to reach.  Patient is  communicating as per patient. Disposition Plan: Status is: Inpatient  Remains inpatient appropriate because:Inpatient level of care appropriate due to severity of illness   Dispo: The patient is from: Home              Anticipated d/c is to: Home              Patient currently is not medically stable to d/c.   Difficult to place patient No         Consultants:   Neurology, signed off  Procedures:   None  Antimicrobials:   Remdesivir 5/29---   Subjective: Patient seen and examined.  Denies any complaints.  He was wondering about going home.  He tells me that his left-sided weakness feels like his usual self.  He has not work with therapies yet. Overnight febrile with 101 temperature.  No family at bedside. I explained to the patient that because of fever, presence of pneumonia and his underlying medical issues, will benefit with 3 days of antiviral therapy and he is agreeable.  Objective: Vitals:   04/21/21 2022 04/22/21 0411 04/22/21 0756 04/22/21 1256  BP: 119/68 125/84 (!) 115/95 119/80  Pulse: 82 80 82 76  Resp: 20 18 18 16   Temp: 100.2 F (37.9 C) (!) 101.1 F (38.4 C) 98.7 F (37.1 C) 98.7 F (37.1 C)  TempSrc: Oral Oral Oral Oral  SpO2: 94% 97% 96% 98%  Weight:      Height:        Intake/Output Summary (Last 24 hours) at 04/22/2021 1304 Last data filed at 04/22/2021 0900 Gross per 24 hour  Intake 2785.3 ml  Output 1350 ml  Net 1435.3 ml  Filed Weights   04/20/21 2100 04/21/21 0827  Weight: 121.8 kg 99.8 kg    Examination:  General exam: Appears calm and comfortable  Not in any distress. Respiratory system: Clear to auscultation. Respiratory effort normal.  No added sounds. Cardiovascular system: S1 & S2 heard, RRR. No JVD, murmurs, rubs, gallops or clicks. No pedal edema. Gastrointestinal system: Abdomen is nondistended, soft and nontender. No organomegaly or masses felt. Normal bowel sounds heard. Central nervous system: Alert and oriented.   Patient has flat affect.  No difficulty speaking, however he is slow to respond. Left upper extremity 0/5, left lower extremity 2-3/5 and mostly using proximal muscles.  Right side is normal.    Data Reviewed: I have personally reviewed following labs and imaging studies  CBC: Recent Labs  Lab 04/20/21 2140 04/20/21 2142 04/21/21 0610  WBC 8.7  --  6.2  NEUTROABS 5.1  --   --   HGB 14.8 15.6 14.1  HCT 46.2 46.0 44.7  MCV 82.9  --  83.2  PLT 226  --  174   Basic Metabolic Panel: Recent Labs  Lab 04/20/21 2140 04/20/21 2142 04/21/21 0610  NA 139 141  --   K 3.6 3.6  --   CL 107 107  --   CO2 25  --   --   GLUCOSE 99 97  --   BUN 10 10  --   CREATININE 1.41* 1.40* 1.18  CALCIUM 9.0  --   --    GFR: Estimated Creatinine Clearance: 97.2 mL/min (by C-G formula based on SCr of 1.18 mg/dL). Liver Function Tests: Recent Labs  Lab 04/20/21 2140  AST 28  ALT 28  ALKPHOS 116  BILITOT 0.5  PROT 7.6  ALBUMIN 3.7   No results for input(s): LIPASE, AMYLASE in the last 168 hours. No results for input(s): AMMONIA in the last 168 hours. Coagulation Profile: Recent Labs  Lab 04/20/21 2140  INR 1.1   Cardiac Enzymes: No results for input(s): CKTOTAL, CKMB, CKMBINDEX, TROPONINI in the last 168 hours. BNP (last 3 results) No results for input(s): PROBNP in the last 8760 hours. HbA1C: No results for input(s): HGBA1C in the last 72 hours. CBG: Recent Labs  Lab 04/20/21 2140 04/22/21 0755  GLUCAP 84 115*   Lipid Profile: No results for input(s): CHOL, HDL, LDLCALC, TRIG, CHOLHDL, LDLDIRECT in the last 72 hours. Thyroid Function Tests: No results for input(s): TSH, T4TOTAL, FREET4, T3FREE, THYROIDAB in the last 72 hours. Anemia Panel: No results for input(s): VITAMINB12, FOLATE, FERRITIN, TIBC, IRON, RETICCTPCT in the last 72 hours. Sepsis Labs: No results for input(s): PROCALCITON, LATICACIDVEN in the last 168 hours.  Recent Results (from the past 240 hour(s))   Resp Panel by RT-PCR (Flu A&B, Covid) Nasopharyngeal Swab     Status: Abnormal   Collection Time: 04/20/21 10:36 PM   Specimen: Nasopharyngeal Swab; Nasopharyngeal(NP) swabs in vial transport medium  Result Value Ref Range Status   SARS Coronavirus 2 by RT PCR POSITIVE (A) NEGATIVE Final    Comment: RESULT CALLED TO, READ BACK BY AND VERIFIED WITH: RN ERIKA SULLIVAN BY MESSAN H. AT 9449 ON 04/20/2021 (NOTE) SARS-CoV-2 target nucleic acids are DETECTED.  The SARS-CoV-2 RNA is generally detectable in upper respiratory specimens during the acute phase of infection. Positive results are indicative of the presence of the identified virus, but do not rule out bacterial infection or co-infection with other pathogens not detected by the test. Clinical correlation with patient history and other diagnostic information is  necessary to determine patient infection status. The expected result is Negative.  Fact Sheet for Patients: EntrepreneurPulse.com.au  Fact Sheet for Healthcare Providers: IncredibleEmployment.be  This test is not yet approved or cleared by the [place] FDA and  has been authorized for detection and/or diagnosis of SARS-CoV-2 by FDA under an Emergency Use Authorization (EUA).  This EUA will remain in effect (meanin g this test can be used) for the duration of  the COVID-19 declaration under Section 564(b)(1) of the Act, 21 U.S.C. section 360bbb-3(b)(1), unless the authorization is terminated or revoked sooner.     Influenza A by PCR NEGATIVE NEGATIVE Final   Influenza B by PCR NEGATIVE NEGATIVE Final    Comment: (NOTE) The Xpert Xpress SARS-CoV-2/FLU/RSV plus assay is intended as an aid in the diagnosis of influenza from Nasopharyngeal swab specimens and should not be used as a sole basis for treatment. Nasal washings and aspirates are unacceptable for Xpert Xpress SARS-CoV-2/FLU/RSV testing.  Fact Sheet for  Patients: EntrepreneurPulse.com.au  Fact Sheet for Healthcare Providers: IncredibleEmployment.be  This test is not yet approved or cleared by the [place] FDA and has been authorized for detection and/or diagnosis of SARS-CoV-2 by FDA under an Emergency Use Authorization (EUA). This EUA will remain in effect (meaning this test can be used) for the duration of the COVID-19 declaration under Section 564(b)(1) of the Act, 21 U.S.C. section 360bbb-3(b)(1), unless the authorization is terminated or revoked.  Performed at [facility], [place] [address] [place], [place] [address]          Radiology Studies: CT Cervical Spine Wo Contrast  Result Date: 04/20/2021 CLINICAL DATA:  Neck trauma EXAM: CT CERVICAL SPINE WITHOUT CONTRAST TECHNIQUE: Multidetector CT imaging of the cervical spine was performed without intravenous contrast. Multiplanar CT image reconstructions were also generated. COMPARISON:  07/19/2019 FINDINGS: Alignment: Normal. Skull base and vertebrae: No acute fracture. No primary bone lesion or focal pathologic process. Extensive encephalomalacia within the right cerebral hemisphere, with evidence of previous right temporal craniotomy, grossly stable since prior head CT. Soft tissues and spinal canal: No prevertebral fluid or swelling. No visible canal hematoma. Disc levels: Uncovertebral hypertrophy at C3-4 results in symmetrical neural foraminal encroachment. Remaining disc spaces are well preserved. Upper chest: Airway is patent. Visualized portions of the lung apices are clear. Other: Reconstructed images demonstrate no additional findings. IMPRESSION: 1. No acute cervical spine fracture. 2. Stable changes of right temporal craniotomy and right cerebral encephalomalacia. Electronically Signed   By: Randa Ngo M.D.   On: 04/20/2021 22:13   MR BRAIN W WO CONTRAST  Result Date: 04/21/2021 CLINICAL DATA:  Stroke follow-up.  History  of fever EXAM: MRI HEAD WITHOUT AND WITH CONTRAST TECHNIQUE: Multiplanar, multiecho pulse sequences of the brain and surrounding structures were obtained without and with intravenous contrast. CONTRAST:  30mL GADAVIST GADOBUTROL 1 MMOL/ML IV SOLN COMPARISON:  Yesterday FINDINGS: Brain: Large remote right MCA territory infarct with unusual masslike component in the right frontal region along the anterior margin of the craniotomy flap. This area has contracted since 2020, now measuring 44 by 12 mm with peripheral enhancement and central isointense nonenhancing material. Findings again favored to reflect chronic hematoma. No acute infarct, hydrocephalus, or visible debris. High-density areas on prior head CT appear mineralized. Wallerian degeneration is seen into the brainstem. Vascular: Normal flow voids and vascular enhancements Skull and upper cervical spine: Unremarkable craniotomy flap Sinuses/Orbits: Negative IMPRESSION: 1. No emergent finding. 2. Sequela of large remote right MCA territory infarct. Electronically  Signed   By: Monte Fantasia M.D.   On: 04/21/2021 04:09   DG Chest Port 1 View  Result Date: 04/21/2021 CLINICAL DATA:  COVID EXAM: PORTABLE CHEST 1 VIEW COMPARISON:  09/21/2019 FINDINGS: Low lung volumes. Patchy ground-glass opacities in the left lower lung. No pleural effusion. Normal cardiac size. No pneumothorax IMPRESSION: Patchy ground-glass opacities in the left lower lung, possible pneumonia Electronically Signed   By: Donavan Foil M.D.   On: 04/21/2021 00:28   CT HEAD CODE STROKE WO CONTRAST  Result Date: 04/20/2021 CLINICAL DATA:  Code stroke.  Initial evaluation for acute stroke. EXAM: CT HEAD WITHOUT CONTRAST TECHNIQUE: Contiguous axial images were obtained from the base of the skull through the vertex without intravenous contrast. COMPARISON:  Prior CT from 11/24/2019 FINDINGS: Brain: Again seen is extensive encephalomalacia of throughout the right cerebral hemisphere related to  prior right ACA and MCA distribution infarct. Associated volume loss with ex vacuo dilatation of the right lateral ventricle. Area of heterogeneous density seen subjacent to and overlying right craniotomy measures 6.7 x 1.8 cm (series 2, image 29). Areas of curvilinear hyperdensity seen within this area, not definitely seen on prior, and could reflect acute or subacute blood products. Somewhat nodular hyperdensity at the deep aspect of this area could reflect dystrophic calcification/mineralization (series 2, image 26). No significant mass effect. No other acute intracranial hemorrhage. No other acute large vessel territory infarct. No other mass lesion or mass effect. No other extra-axial fluid collection. Approximate 9 mm left-to-right shift related to right cerebral volume loss. Vascular: No hyperdense vessel. Skull: Prior right craniotomy. No visible scalp soft tissue abnormality. Sinuses/Orbits: Globes orbital soft tissues demonstrate no acute finding. Paranasal sinuses are clear. No mastoid effusion. Other: None. ASPECTS [facility] Stroke Program Early CT Score) - Ganglionic level infarction (caudate, lentiform nuclei, internal capsule, insula, M1-M3 cortex): 7 - Supraganglionic infarction (M4-M6 cortex): 3 Total score (0-10 with 10 being normal): 10 IMPRESSION: 1. Interval decrease in size of heterogeneous area/collection overlying the right frontal convexity, now measuring 6.7 x 1.8 cm. Nodular hyperdensity at the deep aspect of this area favored to reflect dystrophic calcification. Finding favored to reflect a chronic hematoma. Scattered areas of internal hyperdensity could reflect a small amount of acute to subacute blood products. No mass effect. 2. ASPECTS is 10. 3. Underlying extensive encephalomalacia throughout the right cerebral hemisphere related to prior right ACA/MCA territory infarct. Associated volume loss with left-to-right shift and ex vacuo dilatation of the right lateral ventricle. Critical  Value/emergent results were discussed by telephone at the time of interpretation on 04/20/2021 at 10:00 pm to provider Dr. Lorrin Goodell., Who verbally acknowledged these results. Electronically Signed   By: Jeannine Boga M.D.   On: 04/20/2021 22:22        Scheduled Meds: . vitamin C  500 mg Oral Daily  . aspirin EC  81 mg Oral Daily  . atorvastatin  10 mg Oral Daily  . baclofen  10 mg Oral BID  . enoxaparin (LOVENOX) injection  60 mg Subcutaneous Q24H  . feeding supplement  237 mL Oral BID BM  . metoprolol tartrate  50 mg Oral Daily  . zinc sulfate  220 mg Oral Daily   Continuous Infusions: . lactated ringers 100 mL/hr at 04/22/21 0422  . remdesivir 100 mg in NS 100 mL 100 mg (04/22/21 1215)     LOS: 1 day    Time spent: 30 minutes    Barb Merino, MD [facility] Pager [phone]

## 2021-04-22 NOTE — Plan of Care (Signed)

## 2021-04-22 NOTE — Evaluation (Signed)
Occupational Therapy Evaluation Patient Details Name: Matthew Black MRN: [id number] DOB: December 15, 1975 Today's Date: 04/22/2021    History of Present Illness Pt is 45 yo male admitted after fall at home.  Reports he suddenly became weak and lost his balance.  Pt found to be COVID 19 positive with bil lower lobe infiltrates.  Acute stroke ruled out. 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   Clinical Impression   Pt was ambulating with a quad cane and AFO and assisted for LB ADL prior to admission. He was going to OP PT. Presents with generalized weakness and unsteady gait. He needs set up to max assist for ADL. He ambulated holding bed rails with min guard assist, min assist for turn to chair. Will follow acutely. Recommending home with his supportive family. Will follow acutely.     Follow Up Recommendations  No OT follow up    Equipment Recommendations  None recommended by OT    Recommendations for Other Services       Precautions / Restrictions Precautions Precautions: Fall Precaution Comments: baseline L hemiparesis Required Braces or Orthoses: Other Brace Other Brace: L AFO      Mobility Bed Mobility Overal bed mobility: Needs Assistance Bed Mobility: Supine to Sit     Supine to sit: Mod assist     General bed mobility comments: Mod A to lift trunk to come off L side of bed - pt reports normally goes to R so that he can push up with R UE    Transfers Overall transfer level: Needs assistance Equipment used:  (bed rail) Transfers: Sit to/from Stand Sit to Stand: Min guard         General transfer comment: Performed with min guard for safety; did not need physical assist, stabilized with footboard of bed    Balance Overall balance assessment: Needs assistance Sitting-balance support: No upper extremity supported Sitting balance-Leahy Scale: Good      Standing balance support: Single extremity supported Standing balance-Leahy Scale: Poor Standing balance comment: Required UE support but no LOB with static stand                           ADL either performed or assessed with clinical judgement   ADL Overall ADL's : Needs assistance/impaired Eating/Feeding: Set up;Sitting   Grooming: Wash/dry hands;Sitting;Set up   Upper Body Bathing: Minimal assistance;Sitting   Lower Body Bathing: Minimal assistance;Sitting/lateral leans   Upper Body Dressing : Minimal assistance;Sitting   Lower Body Dressing: Maximal assistance;Sit to/from stand   Toilet Transfer: Minimal assistance;+2 for safety/equipment;Ambulation           Functional mobility during ADLs: Minimal assistance;+2 for safety/equipment       Vision Patient Visual Report: No change from baseline       Perception     Praxis      Pertinent Vitals/Pain Pain Assessment: No/denies pain     Hand Dominance Right   Extremity/Trunk Assessment Upper Extremity Assessment Upper Extremity Assessment: LUE deficits/detail LUE Deficits / Details: dense hemiplegia with flexed finger and thumb, elbow maintained in flexion LUE Coordination: decreased fine motor;decreased gross motor   Lower Extremity Assessment Lower Extremity Assessment: Defer to PT evaluation RLE Deficits / Details: WFL LLE Deficits / Details: Chronic L hemiplegia and wears AFO.  Noting grossly 1/5 strength throughout with increased tone and accessory movements.   Cervical /  Trunk Assessment Cervical / Trunk Assessment: Normal   Communication Communication Communication: Expressive difficulties (slowed speech)   Cognition Arousal/Alertness: Awake/alert Behavior During Therapy: WFL for tasks assessed/performed Overall Cognitive Status: Within Functional Limits for tasks assessed                                 General Comments: Overall cognition was functional.  He was A  and O x 4.  He was slower to respond - likely from prior CVA.   General Comments  Pt reports hopeful to go home and resume outpt PT.  States does not want to lose what he has gained.  He does state that he feels 90% at his baseline.    Exercises     Shoulder Instructions      Home Living Family/patient expects to be discharged to:: Private residence Living Arrangements: Spouse/significant other;Children Available Help at Discharge: Family;Available PRN/intermittently Type of Home: House Home Access: Stairs to enter;Ramped entrance Entrance Stairs-Number of Steps: 3 Entrance Stairs-Rails: None Home Layout: Two level;Bed/bath upstairs;1/2 bath on main level (hospital bed downstairs) Alternate Level Stairs-Number of Steps: flight   Bathroom Shower/Tub: Occupational psychologist: Standard     Home Equipment: Environmental consultant - 2 wheels;Wheelchair - manual;Hospital bed;Cane - quad          Prior Functioning/Environment Level of Independence: Needs assistance  Gait / Transfers Assistance Needed: Walking with quad cane - short community distances ADL's / Homemaking Assistance Needed: Independent with ADLs except wife assist with socks/shoes; reports does sponge baths            OT Problem List: Impaired balance (sitting and/or standing);Decreased coordination      OT Treatment/Interventions: Self-care/ADL training;DME and/or AE instruction;Therapeutic activities;Patient/family education;Balance training    OT Goals(Current goals can be found in the care plan section) Acute Rehab OT Goals Patient Stated Goal: return home OT Goal Formulation: With patient Time For Goal Achievement: 05/06/21 Potential to Achieve Goals: Good ADL Goals Pt Will Perform Grooming: with min guard assist;standing Pt Will Perform Upper Body Bathing: with min assist;sitting Pt Will Perform Upper Body Dressing: with min assist;sitting Pt Will Transfer to Toilet: with min guard assist;ambulating;bedside  commode (over toilet) Pt Will Perform Toileting - Clothing Manipulation and hygiene: with min assist;sit to/from stand Additional ADL Goal #1: Pt will perform bed mobility to L side of bed with supervision.  OT Frequency: Min 2X/week   Barriers to D/C:            Co-evaluation PT/OT/SLP Co-Evaluation/Treatment: Yes Reason for Co-Treatment: For patient/therapist safety PT goals addressed during session: Mobility/safety with mobility OT goals addressed during session: ADL's and self-care      AM-PAC OT "6 Clicks" Daily Activity     Outcome Measure Help from another person eating meals?: A Little Help from another person taking care of personal grooming?: A Little Help from another person toileting, which includes using toliet, bedpan, or urinal?: A Little Help from another person bathing (including washing, rinsing, drying)?: A Little Help from another person to put on and taking off regular upper body clothing?: A Lot   6 Click Score: 14   End of Session Equipment Utilized During Treatment: Gait belt Nurse Communication: Mobility status  Activity Tolerance: Patient tolerated treatment well Patient left: in chair;with call bell/phone within reach  OT Visit Diagnosis: Unsteadiness on feet (R26.81);Other abnormalities of gait and mobility (R26.89);History of falling (Z91.81);Hemiplegia and hemiparesis Hemiplegia -  Right/Left: Left Hemiplegia - dominant/non-dominant: Non-Dominant Hemiplegia - caused by: Cerebral infarction                Time: 9872-1587 OT Time Calculation (min): 30 min Charges:  OT General Charges $OT Visit: 1 Visit OT Evaluation $OT Eval Moderate Complexity: 1 Mod  Nestor Lewandowsky, OTR/L Acute Rehabilitation Services Pager: [phone] Office: [phone]  Malka So 04/22/2021, 3:54 PM

## 2021-04-23 ENCOUNTER — Inpatient Hospital Stay (HOSPITAL_COMMUNITY): Payer: Medicaid Other

## 2021-04-23 DIAGNOSIS — I639 Cerebral infarction, unspecified: Secondary | ICD-10-CM | POA: Diagnosis not present

## 2021-04-23 NOTE — Discharge Summary (Signed)
Physician Discharge Summary  Matthew Black ZTI:[id number] DOB: [birth year] DOA: 04/20/2021  PCP: Mackie Pai, PA-C  Admit date: 04/20/2021 Discharge date: 04/23/2021  Admitted From: Home Disposition: Home  Recommendations for Outpatient Follow-up:  1. Follow up with PCP in 1-2 weeks  Home Health: Home health PT, transition back to outpatient PT per recommendations  Equipment/Devices: No new equipment  Discharge Condition: Stable CODE STATUS: Full Diet recommendation: Low-salt low-fat diet  Brief/Interim Summary: 45 year old gentleman with history of stroke, hypercoagulable state, history of pulmonary embolism and DVT not on anticoagulation due to right MCA stroke with decompressive hemicraniotomy and complicated hemorrhagic conversion with hematoma, chronic left arm hemiplegia and left leg weakness presented to the emergency room with fall at home.  He receives outpatient rehab.  He became weak than usual and lost his balance.  He did have URI symptoms for the last few days.  He is vaccinated against COVID-19. In the emergency room hemodynamically stable. COVID-19 positive. Bilateral lower lobe infiltrates.  On room air. Initial CT head with old stroke.  MRI with old stroke no new stroke.  Patient admitted as above with fall and weakness concerning for acute CVA, imaging reassuringly negative as below, patient incidentally noted to have COVID positive swab on intake with questionable pneumonia on chest x-ray transiently requiring oxygen now on room air.  He has completed his 3-day Remdesivir course per current guidelines otherwise stable and agreeable for discharge home.  PT recommending home health PT with transition to outpatient PT per future recommendations.  Otherwise close follow-up with PCP for reevaluation as needed.   Discharge Diagnoses:  Principal Problem:   Acute CVA (cerebrovascular accident) [facility]) Active Problems:   Essential hypertension   Primary hypercoagulable state  ([place])   History of cranioplasty   COVID-19 virus infection   Left-sided weakness    Discharge Instructions  Discharge Instructions    Diet - low sodium heart healthy   Complete by: As directed    Increase activity slowly   Complete by: As directed      Allergies as of 04/23/2021      Reactions   Hydrocodone Nausea Only      Medication List    STOP taking these medications   venlafaxine XR 37.5 MG 24 hr capsule Commonly known as: EFFEXOR-XR     TAKE these medications   aspirin EC 81 MG tablet Take 1 tablet (81 mg total) by mouth daily. Swallow whole.   atorvastatin 10 MG tablet Commonly known as: LIPITOR TAKE 1 TABLET BY MOUTH EVERY DAY   baclofen 10 MG tablet Commonly known as: LIORESAL Take 1 tablet (10 mg total) by mouth 2 (two) times daily.   metoprolol tartrate 50 MG tablet Commonly known as: LOPRESSOR Take 1 tablet (50 mg total) by mouth daily.       Allergies  Allergen Reactions  . Hydrocodone Nausea Only    Consultations: None  Procedures/Studies: CT Cervical Spine Wo Contrast  Result Date: 04/20/2021 CLINICAL DATA:  Neck trauma EXAM: CT CERVICAL SPINE WITHOUT CONTRAST TECHNIQUE: Multidetector CT imaging of the cervical spine was performed without intravenous contrast. Multiplanar CT image reconstructions were also generated. COMPARISON:  07/19/2019 FINDINGS: Alignment: Normal. Skull base and vertebrae: No acute fracture. No primary bone lesion or focal pathologic process. Extensive encephalomalacia within the right cerebral hemisphere, with evidence of previous right temporal craniotomy, grossly stable since prior head CT. Soft tissues and spinal canal: No prevertebral fluid or swelling. No visible canal hematoma. Disc levels: Uncovertebral hypertrophy at C3-4  results in symmetrical neural foraminal encroachment. Remaining disc spaces are well preserved. Upper chest: Airway is patent. Visualized portions of the lung apices are clear. Other:  Reconstructed images demonstrate no additional findings. IMPRESSION: 1. No acute cervical spine fracture. 2. Stable changes of right temporal craniotomy and right cerebral encephalomalacia. Electronically Signed   By: Randa Ngo M.D.   On: 04/20/2021 22:13   MR BRAIN W WO CONTRAST  Result Date: 04/21/2021 CLINICAL DATA:  Stroke follow-up.  History of fever EXAM: MRI HEAD WITHOUT AND WITH CONTRAST TECHNIQUE: Multiplanar, multiecho pulse sequences of the brain and surrounding structures were obtained without and with intravenous contrast. CONTRAST:  36mL GADAVIST GADOBUTROL 1 MMOL/ML IV SOLN COMPARISON:  Yesterday FINDINGS: Brain: Large remote right MCA territory infarct with unusual masslike component in the right frontal region along the anterior margin of the craniotomy flap. This area has contracted since 2020, now measuring 44 by 12 mm with peripheral enhancement and central isointense nonenhancing material. Findings again favored to reflect chronic hematoma. No acute infarct, hydrocephalus, or visible debris. High-density areas on prior head CT appear mineralized. Wallerian degeneration is seen into the brainstem. Vascular: Normal flow voids and vascular enhancements Skull and upper cervical spine: Unremarkable craniotomy flap Sinuses/Orbits: Negative IMPRESSION: 1. No emergent finding. 2. Sequela of large remote right MCA territory infarct. Electronically Signed   By: Monte Fantasia M.D.   On: 04/21/2021 04:09   DG Chest Port 1 View  Result Date: 04/21/2021 CLINICAL DATA:  COVID EXAM: PORTABLE CHEST 1 VIEW COMPARISON:  09/21/2019 FINDINGS: Low lung volumes. Patchy ground-glass opacities in the left lower lung. No pleural effusion. Normal cardiac size. No pneumothorax IMPRESSION: Patchy ground-glass opacities in the left lower lung, possible pneumonia Electronically Signed   By: Donavan Foil M.D.   On: 04/21/2021 00:28   CT HEAD CODE STROKE WO CONTRAST  Result Date: 04/20/2021 CLINICAL DATA:   Code stroke.  Initial evaluation for acute stroke. EXAM: CT HEAD WITHOUT CONTRAST TECHNIQUE: Contiguous axial images were obtained from the base of the skull through the vertex without intravenous contrast. COMPARISON:  Prior CT from 11/24/2019 FINDINGS: Brain: Again seen is extensive encephalomalacia of throughout the right cerebral hemisphere related to prior right ACA and MCA distribution infarct. Associated volume loss with ex vacuo dilatation of the right lateral ventricle. Area of heterogeneous density seen subjacent to and overlying right craniotomy measures 6.7 x 1.8 cm (series 2, image 29). Areas of curvilinear hyperdensity seen within this area, not definitely seen on prior, and could reflect acute or subacute blood products. Somewhat nodular hyperdensity at the deep aspect of this area could reflect dystrophic calcification/mineralization (series 2, image 26). No significant mass effect. No other acute intracranial hemorrhage. No other acute large vessel territory infarct. No other mass lesion or mass effect. No other extra-axial fluid collection. Approximate 9 mm left-to-right shift related to right cerebral volume loss. Vascular: No hyperdense vessel. Skull: Prior right craniotomy. No visible scalp soft tissue abnormality. Sinuses/Orbits: Globes orbital soft tissues demonstrate no acute finding. Paranasal sinuses are clear. No mastoid effusion. Other: None. ASPECTS [facility] Stroke Program Early CT Score) - Ganglionic level infarction (caudate, lentiform nuclei, internal capsule, insula, M1-M3 cortex): 7 - Supraganglionic infarction (M4-M6 cortex): 3 Total score (0-10 with 10 being normal): 10 IMPRESSION: 1. Interval decrease in size of heterogeneous area/collection overlying the right frontal convexity, now measuring 6.7 x 1.8 cm. Nodular hyperdensity at the deep aspect of this area favored to reflect dystrophic calcification. Finding favored to reflect a chronic  hematoma. Scattered areas of internal  hyperdensity could reflect a small amount of acute to subacute blood products. No mass effect. 2. ASPECTS is 10. 3. Underlying extensive encephalomalacia throughout the right cerebral hemisphere related to prior right ACA/MCA territory infarct. Associated volume loss with left-to-right shift and ex vacuo dilatation of the right lateral ventricle. Critical Value/emergent results were discussed by telephone at the time of interpretation on 04/20/2021 at 10:00 pm to provider Dr. Lorrin Goodell., Who verbally acknowledged these results. Electronically Signed   By: Jeannine Boga M.D.   On: 04/20/2021 22:22   VAS US CAROTID  Result Date: 04/23/2021 Carotid Arterial Duplex Study Patient Name:  Matthew Black  Date of Exam:   04/23/2021 Medical Rec #: [id number]       Accession #:    [id number] Date of Birth: 01-19-1976        Patient Gender: M Patient Age:   45Y Exam Location:  [facility] Procedure:      VAS US CAROTID Referring Phys: [id number] [place] --------------------------------------------------------------------------------  Indications:       CVA. Risk Factors:      Hypertension, prior CVA. Comparison Study:  no prior Performing Technologist: [name] RVS  Examination Guidelines: A complete evaluation includes B-mode imaging, spectral Doppler, color Doppler, and power Doppler as needed of all accessible portions of each vessel. Bilateral testing is considered an integral part of a complete examination. Limited examinations for reoccurring indications may be performed as noted.  Right Carotid Findings: +----------+--------+--------+--------+------------------+--------+           PSV cm/sEDV cm/sStenosisPlaque DescriptionComments +----------+--------+--------+--------+------------------+--------+ CCA Prox  106                     heterogenous               +----------+--------+--------+--------+------------------+--------+ CCA Distal42      9               heterogenous                +----------+--------+--------+--------+------------------+--------+ ICA Prox  79      20      1-39%   heterogenous               +----------+--------+--------+--------+------------------+--------+ ICA Distal76      14                                         +----------+--------+--------+--------+------------------+--------+ ECA       123     20                                         +----------+--------+--------+--------+------------------+--------+ +----------+--------+-------+--------+-------------------+           PSV cm/sEDV cmsDescribeArm Pressure (mmHG) +----------+--------+-------+--------+-------------------+ [id number]                                         +----------+--------+-------+--------+-------------------+ +---------+--------+--+--------+--+---------+ VertebralPSV cm/s34EDV cm/s11Antegrade +---------+--------+--+--------+--+---------+  Left Carotid Findings: +----------+--------+--------+--------+------------------+--------+           PSV cm/sEDV cm/sStenosisPlaque DescriptionComments +----------+--------+--------+--------+------------------+--------+ CCA Prox  138     21              heterogenous               +----------+--------+--------+--------+------------------+--------+  CCA Distal63      15              heterogenous               +----------+--------+--------+--------+------------------+--------+ ICA Prox  52      20      1-39%   heterogenous               +----------+--------+--------+--------+------------------+--------+ ICA Distal58      25                                         +----------+--------+--------+--------+------------------+--------+ ECA       52      10                                         +----------+--------+--------+--------+------------------+--------+ +----------+--------+--------+--------+-------------------+           PSV cm/sEDV cm/sDescribeArm Pressure (mmHG)  +----------+--------+--------+--------+-------------------+ [id number]                                          +----------+--------+--------+--------+-------------------+ +---------+--------+--+--------+--+---------+ VertebralPSV cm/s29EDV cm/s11Antegrade +---------+--------+--+--------+--+---------+   Summary: Right Carotid: Velocities in the right ICA are consistent with a 1-39% stenosis. Left Carotid: Velocities in the left ICA are consistent with a 1-39% stenosis. Vertebrals: Bilateral vertebral arteries demonstrate antegrade flow. *See table(s) above for measurements and observations.     Preliminary       Subjective: No acute issues or events overnight denies nausea vomiting diarrhea constipation headache fevers or chills   Discharge Exam: Vitals:   04/22/21 2050 04/23/21 0425  BP: 118/81 128/78  Pulse: 75 83  Resp: 18 18  Temp: 98.1 F (36.7 C) 98.8 F (37.1 C)  SpO2: 100% 95%   Vitals:   04/22/21 1256 04/22/21 1704 04/22/21 2050 04/23/21 0425  BP: 119/80 92/61 118/81 128/78  Pulse: 76 72 75 83  Resp: 16 20 18 18   Temp: 98.7 F (37.1 C) 98.7 F (37.1 C) 98.1 F (36.7 C) 98.8 F (37.1 C)  TempSrc: Oral Oral Oral Oral  SpO2: 98% 91% 100% 95%  Weight:      Height:        General: Pt is alert, awake, not in acute distress Cardiovascular: RRR, S1/S2 +, no rubs, no gallops Respiratory: CTA bilaterally, no wheezing, no rhonchi Abdominal: Soft, NT, ND, bowel sounds + Extremities: no edema, no cyanosis    The results of significant diagnostics from this hospitalization (including imaging, microbiology, ancillary and laboratory) are listed below for reference.     Microbiology: Recent Results (from the past 240 hour(s))  Resp Panel by RT-PCR (Flu A&B, Covid) Nasopharyngeal Swab     Status: Abnormal   Collection Time: 04/20/21 10:36 PM   Specimen: Nasopharyngeal Swab; Nasopharyngeal(NP) swabs in vial transport medium  Result Value Ref Range Status    SARS Coronavirus 2 by RT PCR POSITIVE (A) NEGATIVE Final    Comment: RESULT CALLED TO, READ BACK BY AND VERIFIED WITH: RN ERIKA SULLIVAN BY MESSAN H. AT 1700 ON 04/20/2021 (NOTE) SARS-CoV-2 target nucleic acids are DETECTED.  The SARS-CoV-2 RNA is generally detectable in upper respiratory specimens during the acute phase of infection. Positive results are indicative of the  presence of the identified virus, but do not rule out bacterial infection or co-infection with other pathogens not detected by the test. Clinical correlation with patient history and other diagnostic information is necessary to determine patient infection status. The expected result is Negative.  Fact Sheet for Patients: EntrepreneurPulse.com.au  Fact Sheet for Healthcare Providers: IncredibleEmployment.be  This test is not yet approved or cleared by the [place] FDA and  has been authorized for detection and/or diagnosis of SARS-CoV-2 by FDA under an Emergency Use Authorization (EUA).  This EUA will remain in effect (meanin g this test can be used) for the duration of  the COVID-19 declaration under Section 564(b)(1) of the Act, 21 U.S.C. section 360bbb-3(b)(1), unless the authorization is terminated or revoked sooner.     Influenza A by PCR NEGATIVE NEGATIVE Final   Influenza B by PCR NEGATIVE NEGATIVE Final    Comment: (NOTE) The Xpert Xpress SARS-CoV-2/FLU/RSV plus assay is intended as an aid in the diagnosis of influenza from Nasopharyngeal swab specimens and should not be used as a sole basis for treatment. Nasal washings and aspirates are unacceptable for Xpert Xpress SARS-CoV-2/FLU/RSV testing.  Fact Sheet for Patients: EntrepreneurPulse.com.au  Fact Sheet for Healthcare Providers: IncredibleEmployment.be  This test is not yet approved or cleared by the [place] FDA and has been authorized for detection and/or  diagnosis of SARS-CoV-2 by FDA under an Emergency Use Authorization (EUA). This EUA will remain in effect (meaning this test can be used) for the duration of the COVID-19 declaration under Section 564(b)(1) of the Act, 21 U.S.C. section 360bbb-3(b)(1), unless the authorization is terminated or revoked.  Performed at [facility], [place] [address] [place], [place] [address]      Labs: BNP (last 3 results) No results for input(s): BNP in the last 8760 hours. Basic Metabolic Panel: Recent Labs  Lab 04/20/21 2140 04/20/21 2142 04/21/21 0610  NA 139 141  --   K 3.6 3.6  --   CL 107 107  --   CO2 25  --   --   GLUCOSE 99 97  --   BUN 10 10  --   CREATININE 1.41* 1.40* 1.18  CALCIUM 9.0  --   --    Liver Function Tests: Recent Labs  Lab 04/20/21 2140  AST 28  ALT 28  ALKPHOS 116  BILITOT 0.5  PROT 7.6  ALBUMIN 3.7   No results for input(s): LIPASE, AMYLASE in the last 168 hours. No results for input(s): AMMONIA in the last 168 hours. CBC: Recent Labs  Lab 04/20/21 2140 04/20/21 2142 04/21/21 0610  WBC 8.7  --  6.2  NEUTROABS 5.1  --   --   HGB 14.8 15.6 14.1  HCT 46.2 46.0 44.7  MCV 82.9  --  83.2  PLT 226  --  221   Cardiac Enzymes: No results for input(s): CKTOTAL, CKMB, CKMBINDEX, TROPONINI in the last 168 hours. BNP: Invalid input(s): POCBNP CBG: Recent Labs  Lab 04/20/21 2140 04/22/21 0755  GLUCAP 84 115*   D-Dimer Recent Labs    04/21/21 0610  DDIMER 1.27*   Hgb A1c No results for input(s): HGBA1C in the last 72 hours. Lipid Profile No results for input(s): CHOL, HDL, LDLCALC, TRIG, CHOLHDL, LDLDIRECT in the last 72 hours. Thyroid function studies No results for input(s): TSH, T4TOTAL, T3FREE, THYROIDAB in the last 72 hours.  Invalid input(s): FREET3 Anemia work up No results for input(s): VITAMINB12, FOLATE, FERRITIN, TIBC, IRON, RETICCTPCT in the last  72 hours. Urinalysis    Component Value Date/Time   COLORURINE YELLOW  04/20/2021 2140   APPEARANCEUR HAZY (A) 04/20/2021 2140   LABSPEC 1.016 04/20/2021 2140   PHURINE 7.0 04/20/2021 2140   GLUCOSEU NEGATIVE 04/20/2021 2140   HGBUR MODERATE (A) 04/20/2021 2140   BILIRUBINUR NEGATIVE 04/20/2021 2140   [place] 04/20/2021 2140   PROTEINUR NEGATIVE 04/20/2021 2140   NITRITE NEGATIVE 04/20/2021 2140   LEUKOCYTESUR NEGATIVE 04/20/2021 2140   Sepsis Labs Invalid input(s): PROCALCITONIN,  WBC,  LACTICIDVEN Microbiology Recent Results (from the past 240 hour(s))  Resp Panel by RT-PCR (Flu A&B, Covid) Nasopharyngeal Swab     Status: Abnormal   Collection Time: 04/20/21 10:36 PM   Specimen: Nasopharyngeal Swab; Nasopharyngeal(NP) swabs in vial transport medium  Result Value Ref Range Status   SARS Coronavirus 2 by RT PCR POSITIVE (A) NEGATIVE Final    Comment: RESULT CALLED TO, READ BACK BY AND VERIFIED WITH: RN ERIKA SULLIVAN BY MESSAN H. AT 9371 ON 04/20/2021 (NOTE) SARS-CoV-2 target nucleic acids are DETECTED.  The SARS-CoV-2 RNA is generally detectable in upper respiratory specimens during the acute phase of infection. Positive results are indicative of the presence of the identified virus, but do not rule out bacterial infection or co-infection with other pathogens not detected by the test. Clinical correlation with patient history and other diagnostic information is necessary to determine patient infection status. The expected result is Negative.  Fact Sheet for Patients: EntrepreneurPulse.com.au  Fact Sheet for Healthcare Providers: IncredibleEmployment.be  This test is not yet approved or cleared by the [place] FDA and  has been authorized for detection and/or diagnosis of SARS-CoV-2 by FDA under an Emergency Use Authorization (EUA).  This EUA will remain in effect (meanin g this test can be used) for the duration of  the COVID-19 declaration under Section 564(b)(1) of the Act, 21 U.S.C.  section 360bbb-3(b)(1), unless the authorization is terminated or revoked sooner.     Influenza A by PCR NEGATIVE NEGATIVE Final   Influenza B by PCR NEGATIVE NEGATIVE Final    Comment: (NOTE) The Xpert Xpress SARS-CoV-2/FLU/RSV plus assay is intended as an aid in the diagnosis of influenza from Nasopharyngeal swab specimens and should not be used as a sole basis for treatment. Nasal washings and aspirates are unacceptable for Xpert Xpress SARS-CoV-2/FLU/RSV testing.  Fact Sheet for Patients: EntrepreneurPulse.com.au  Fact Sheet for Healthcare Providers: IncredibleEmployment.be  This test is not yet approved or cleared by the [place] FDA and has been authorized for detection and/or diagnosis of SARS-CoV-2 by FDA under an Emergency Use Authorization (EUA). This EUA will remain in effect (meaning this test can be used) for the duration of the COVID-19 declaration under Section 564(b)(1) of the Act, 21 U.S.C. section 360bbb-3(b)(1), unless the authorization is terminated or revoked.  Performed at [facility], [place] [address] [place], [place] [address]     Time coordinating discharge: Over 30 minutes  SIGNED:  Little Ishikawa, DO [facility] 04/23/2021, 11:38 AM Pager   If 7PM-7AM, please contact night-coverage [email or web address]

## 2021-04-23 NOTE — Progress Notes (Signed)
Patient refused blood work. Will continue to monitor pt. Racheal Patches RN

## 2021-04-23 NOTE — Progress Notes (Signed)
Physical Therapy Treatment Patient Details Name: Matthew Black MRN: [id number] DOB: 1976/01/26 Today's Date: 04/23/2021    History of Present Illness Pt is 45 yo male admitted after fall at home.  Reports he suddenly became weak and lost his balance.  Pt found to be COVID 19 positive with bil lower lobe infiltrates.  Acute stroke ruled out. 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    PT Comments    Patient received in bed, pleasant but reports he is done on bedpan/was unable to have BM. Still needs ModA to get to EOB on left side but again this is likely due to this being his hemiparetic side- reports he usually gets to EOB on the right at home. From there able to perform functional transfers and gait train in room with quad cane and min guard. Does have quite a wide BOS, however able to maintain balance with quad cane and has generally functional gait pattern, although quite slow. Left up in recliner with all needs met, nursing staff aware of patient status. VSS on RA.     Follow Up Recommendations  Supervision for mobility/OOB;Other (comment);Home health PT (with transition back to OP PT)     Equipment Recommendations  Other (comment) (has DME)    Recommendations for Other Services       Precautions / Restrictions Precautions Precautions: Fall Precaution Comments: baseline L hemiparesis Required Braces or Orthoses: Other Brace Other Brace: L AFO Restrictions Weight Bearing Restrictions: No    Mobility  Bed Mobility Overal bed mobility: Needs Assistance Bed Mobility: Supine to Sit     Supine to sit: Mod assist     General bed mobility comments: ModA to get off of L side of bed- likely due to coming off of bed with hemiparetic side    Transfers Overall transfer level: Needs assistance Equipment used: Quad cane Transfers: Sit to/from Stand Sit to Stand: Min assist          General transfer comment: min guard for safety, increased time and effort as well as wide BOS/extra time to gain balance  Ambulation/Gait Ambulation/Gait assistance: Min guard Gait Distance (Feet): 16 Feet Assistive device: Quad cane Gait Pattern/deviations: Step-to pattern;Decreased stride length;Decreased dorsiflexion - left;Decreased weight shift to left;Wide base of support     General Gait Details: did well with quad cane, tends to have wide BOS with some circumduction of L LE even with AFO, but functional. close min guard due to increased sway, but did not need physical assist. Wore AFO, more steady with brace.   Stairs             Wheelchair Mobility    Modified Rankin (Stroke Patients Only)       Balance Overall balance assessment: Needs assistance Sitting-balance support: No upper extremity supported;Feet supported Sitting balance-Leahy Scale: Good     Standing balance support: Single extremity supported;During functional activity Standing balance-Leahy Scale: Fair Standing balance comment: close min guard with quad cane, no physical assist given for balance                            Cognition Arousal/Alertness: Awake/alert Behavior During Therapy: WFL for tasks assessed/performed Overall Cognitive Status: Impaired/Different from baseline Area of Impairment: Following commands;Problem solving  Following Commands: Follows one step commands with increased time;Follows one step commands consistently     Problem Solving: Slow processing;Difficulty sequencing;Requires verbal cues;Requires tactile cues General Comments: cognition functional but with slow processing and sometimes with need for repetitive questions/cues from PT. Slow to respond.      Exercises      General Comments General comments (skin integrity, edema, etc.): VSS on room air      Pertinent Vitals/Pain Pain Assessment: No/denies pain     Home Living                      Prior Function            PT Goals (current goals can now be found in the care plan section) Acute Rehab PT Goals Patient Stated Goal: return home PT Goal Formulation: With patient Time For Goal Achievement: 05/06/21 Potential to Achieve Goals: Good Progress towards PT goals: Progressing toward goals    Frequency    Min 3X/week      PT Plan Current plan remains appropriate    Co-evaluation              AM-PAC PT "6 Clicks" Mobility   Outcome Measure  Help needed turning from your back to your side while in a flat bed without using bedrails?: A Little Help needed moving from lying on your back to sitting on the side of a flat bed without using bedrails?: A Lot Help needed moving to and from a bed to a chair (including a wheelchair)?: A Little Help needed standing up from a chair using your arms (e.g., wheelchair or bedside chair)?: A Little Help needed to walk in hospital room?: A Little Help needed climbing 3-5 steps with a railing? : A Lot 6 Click Score: 16    End of Session Equipment Utilized During Treatment: Gait belt Activity Tolerance: Patient tolerated treatment well Patient left: in chair;with call bell/phone within reach Nurse Communication: Mobility status PT Visit Diagnosis: Hemiplegia and hemiparesis;Unsteadiness on feet (R26.81);Muscle weakness (generalized) (M62.81) Hemiplegia - Right/Left: Left Hemiplegia - dominant/non-dominant: Non-dominant Hemiplegia - caused by:  (chronic R MCA with hemorrhagic conversion)     Time: 0071-2197 PT Time Calculation (min) (ACUTE ONLY): 38 min  Charges:  $Gait Training: 8-22 mins $Therapeutic Activity: 23-37 mins                       Windell Norfolk, DPT, PN1   Supplemental Physical Therapist [place]    Pager [phone] Acute Rehab Office [phone]

## 2021-04-23 NOTE — TOC Transition Note (Signed)
Transition of Care [facility]) - CM/SW Discharge Note   Patient Details  Name: Matthew Black MRN: [id number] Date of Birth: 1976/01/08  [facility] CM/SW Contact:  Ninfa Meeker, RN Phone Number: 04/23/2021, 2:29 PM   Clinical Narrative:   Case manager spoke with patient concerning discharge plan. Discussed need for Home Health therapy. Patient states he has used Home Health in the past, cant recall name of agency. Case manager called referral to Adela Lank, [place] with [facility]. No further needs identified.         Patient Goals and CMS Choice        Discharge Placement                       Discharge Plan and Services In-house Referral: NA Discharge Planning Services: CM Consult            DME Arranged: N/A DME Agency: NA       HH Arranged: PT HH Agency: [place] Date [facility] Agency Contacted: 04/23/21 Time [place]: 9977 Representative spoke with at [place]: [place] ([place]) Interventions     Readmission Risk Interventions Readmission Risk Prevention Plan 09/06/2019  Transportation Screening (No Data)  Some recent data might be hidden

## 2021-04-23 NOTE — Progress Notes (Signed)
Carotid duplex has been completed.   Preliminary results in CV Proc.   [name] 04/23/2021 11:29 AM

## 2021-04-23 NOTE — Progress Notes (Signed)
Please see in addition to PT note:   SATURATION QUALIFICATIONS: (This note is used to comply with regulatory documentation for home oxygen)  Patient Saturations on Room Air at Rest = 98%  Patient Saturations on Room Air while Ambulating = 94%   Patient Saturations on 2 Liters of oxygen while Ambulating = DNT   Please briefly explain why patient needs home oxygen:no O2 needs  Windell Norfolk, DPT, PN1   Supplemental Physical Therapist [place]    Pager [phone] Acute Rehab Office [phone]

## 2021-04-23 NOTE — Progress Notes (Signed)
Patient discharged home.  Discharge instructions explained, pt verbalizes understanding.

## 2021-04-24 ENCOUNTER — Telehealth: Payer: Self-pay | Admitting: *Deleted

## 2021-04-24 NOTE — Telephone Encounter (Signed)
Transition Care Management Follow-up Telephone Call  Date of discharge and from where: 04/23/2021 - [place]  How have you been since you were released from the hospital? "Okay"  Any questions or concerns? No  Items Reviewed:  Did the pt receive and understand the discharge instructions provided? Yes   Medications obtained and verified? Yes   Other? No   Any new allergies since your discharge? No   Dietary orders reviewed? No  Do you have support at home? Yes   Home Care and Equipment/Supplies: Were home health services ordered? yes If so, what is the name of the agency? Pt is not sure  Has the agency set up a time to come to the patient's home? yes Were any new equipment or medical supplies ordered?  No What is the name of the medical supply agency? N/A Were you able to get the supplies/equipment? not applicable Do you have any questions related to the use of the equipment or supplies? No  Functional Questionnaire: (I = Independent and D = Dependent) ADLs: I  Bathing/Dressing- I  Meal Prep- I  Eating- I  Maintaining continence- I  Transferring/Ambulation- I  Managing Meds- I  Follow up appointments reviewed:   PCP Hospital f/u appt confirmed? No  Specialist Hospital f/u appt confirmed? No    Are transportation arrangements needed? N/A  If their condition worsens, is the pt aware to call PCP or go to the Emergency Dept.? Yes  Was the patient provided with contact information for the PCP's office or ED? Yes  Was to pt encouraged to call back with questions or concerns? Yes

## 2021-04-26 ENCOUNTER — Telehealth: Payer: Self-pay | Admitting: Medical

## 2021-04-26 NOTE — Telephone Encounter (Signed)
Patient tested positive  on Friday for Covid he was admitted and released on Tuesday. He states he is still having couging sneezing and runny nose, He want to know if something can be called in or referred over the counter for him to take

## 2021-04-27 NOTE — Telephone Encounter (Signed)
Pt was diagnosed with covid in ED. He was given 3 days of remdesivir. Hard to give advise as to extent of symptomatic care based on limited information and his request. Please get pt to schedule virtual visit. Need him to have 02 sat% on that visit.  Pt sent yesterday. Just now on Saturday being able review his message, chart and recent discharge summary.

## 2021-04-29 NOTE — Telephone Encounter (Signed)
Called pt and lvm to return call.  

## 2021-04-30 NOTE — Telephone Encounter (Signed)
Called pt and lvm to return call.  

## 2021-05-02 NOTE — Telephone Encounter (Signed)
Pt called and lvm to return call 

## 2021-05-15 ENCOUNTER — Encounter: Payer: Medicaid Other | Attending: Physical Medicine and Rehabilitation | Admitting: Psychology

## 2021-05-15 DIAGNOSIS — M25519 Pain in unspecified shoulder: Secondary | ICD-10-CM | POA: Insufficient documentation

## 2021-05-22 ENCOUNTER — Ambulatory Visit (INDEPENDENT_AMBULATORY_CARE_PROVIDER_SITE_OTHER): Payer: Medicaid Other | Admitting: Adult Health

## 2021-05-22 ENCOUNTER — Encounter: Payer: Self-pay | Admitting: Adult Health

## 2021-05-22 VITALS — BP 129/84 | HR 69 | Ht 72.0 in

## 2021-05-22 DIAGNOSIS — I69954 Hemiplegia and hemiparesis following unspecified cerebrovascular disease affecting left non-dominant side: Secondary | ICD-10-CM | POA: Diagnosis not present

## 2021-05-22 DIAGNOSIS — R269 Unspecified abnormalities of gait and mobility: Secondary | ICD-10-CM | POA: Diagnosis not present

## 2021-05-22 DIAGNOSIS — I6521 Occlusion and stenosis of right carotid artery: Secondary | ICD-10-CM | POA: Diagnosis not present

## 2021-05-22 DIAGNOSIS — I69398 Other sequelae of cerebral infarction: Secondary | ICD-10-CM | POA: Diagnosis not present

## 2021-05-22 DIAGNOSIS — I639 Cerebral infarction, unspecified: Secondary | ICD-10-CM

## 2021-05-22 NOTE — Patient Instructions (Signed)
Referral placed for home health therapies - you can call them at [phone] to schedule visits  Please contact [facility] to reschedule imaging of your head and neck vessels - you can contact them at [phone]  Continue aspirin 81 mg daily  and atorvastatin  for secondary stroke prevention  Continue to follow up with PCP regarding cholesterol and blood pressure management  Maintain strict control of hypertension with blood pressure goal below 130/90 and cholesterol with LDL cholesterol (bad cholesterol) goal below 70 mg/dL.       Followup in the future with me in 4 months or call earlier if needed       Thank you for coming to see [place] at [facility]. I hope we have been able to provide you high quality care today.  You may receive a patient satisfaction survey over the next few weeks. We would appreciate your feedback and comments so that we may continue to improve ourselves and the health of our patients.

## 2021-05-22 NOTE — Progress Notes (Signed)
[facility] [address] [place]. [place] [address] [phone]       OFFICE FOLLOW-UP NOTE  [name] Date of Birth:  08/01/1976 Medical Record Number:  [id number]    Primary neurologist: Dr. Leonie Black Reason for visit: Stroke follow-up  Chief Complaint  Patient presents with   Follow-up    RM 14 alone Pt is well, had a fall down steps last month. Having L sided weakness      HPI:   Today, 05/22/2021, Matthew Black returns for 2-month stroke follow-up unaccompanied.  His father brought him to visit but waited in the waiting room.  Reports continued left spastic hemiparesis routinely followed by Dr. Ranell Black receiving Botox injections.  Denies improvement but denies worsening.  Able to ambulate with cane short distances and use of AFO brace.  He did have a fall on 5/28 while walking upstairs and reported his leg gave out.  Evaluated in the ED and found to be positive for COVID-19 and possible pneumonia.  MRI and CT head negative for acute stroke.  Evaluated by therapies who recommended [facility] but he reports he was never contacted to initiate.  He has not had any additional falls since that time.  Denies new stroke/TIA symptoms.  Compliant on aspirin and atorvastatin without associated side effects.  Blood pressure today 129/84.  No further concerns at this time.     History provided for reference purposes only Initial visit 01/30/2021 Dr. Leonie Black: Matthew Black is a 45 year old African-American male seen today for initial office follow-up visit following hospital admission for stroke in August 2020.  He is accompanied by his wife.  History is obtained from them, review of electronic medical records and personally reviewed available imaging films in PACS.  Patient has past medical stay of hypertension and kidney stones.  He presented on 07/18/2021 [facility] being found somnolent and nonverbal in the morning with left hemiplegia.  The wife left at 8:30 in the morning  when the patient seemed fine.  She did not hear back from him the rest of the day.  He was found the next day at home with dried vomit around his mouth and covered in urine.  Urine drug screen was positive for cannabis.  CT scan of the head on admission showed a large right hemispheric infarct with cytotoxic edema involving the right anterior cerebral and middle cerebral artery territories with mild mass-effect and no hemorrhage.  MRA showed complete occlusion of the right internal carotid artery right anterior cerebral and middle cerebral arteries.  He was intubated for airway protection and started on hypertonic saline.  He underwent decompressive right hemicraniectomy to me by Dr. Kathyrn Black on 07/19/2019.  His hospital course was complicated by hemorrhagic conversion with hematoma 07/30/2019 as well as further clinical worsening on 08/14/2019.Marland Kitchen He had a prolonged hospital course which was complicated by lower extremity DVT in the right peroneal, left popliteal and left posterior tibial and left peroneal veins.  TCD bubble study was negative for PFO.  LDL cholesterol is 83 mg percent.  Urine drug screen was positive for cannabis.  Hemoglobin A1c was 5.4.  Hypercoagulable panel and autoimmune work-up was negative.  He was started on IV heparin for DVT but after he developed hemorrhagic transformation it had to be temporarily stopped.  Follow-up CT scan showed irregular collections beneath the craniectomy site with question of disruption of dura with CSF leak or possible infection.  MRI on 08/20/2019 showed extremely heavy herniation of the brain into  the craniectomy site with large irregular enhancing fluid collection along the anterior margin of the craniectomy site measuring 5.4 x 9.8 cm which could represent possible abscess versus sterile hematoma versus infected hematoma.  This fluid collection was aspirated and sent for cultures.  No growth was found.  Patient had initial IV heparin started but when he developed  hemorrhagic transformation IVC filter was placed on 07/21/2019 with plan for retrieval later.  Chest CT scan also showed nonocclusive thrombus in the left posterior lower lobe pulmonary segmental artery.  He also developed abdominal wall hematoma which was followed conservatively.  He had possible seizure-like activity and twitchings but long-term EEG monitoring showed right frontal dysfunction but no definite seizure activity.  He was placed on Keppra for short-term.  He developed a UTI which grew Pseudomonas which was treated with antibiotics.  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  . Patient is head outpatient follow-up with rehab clinic in is currently getting Botox injections and baclofen for his spasticity.  He is able to ambulate independently using a quad cane and has a left ankle-foot brace.  He has not obtained any significant improvement in his left hand which is contracted at the left wrist and fingers but he is able to move his leg much better except he has a foot drop.  He is currently not on aspirin.  He had follow-up lower extremity venous Doppler on 01/25/2021 which showed no evidence of DVT.  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   Stroke ([facility])    RICA, R A1, R MCA occlusion 07/19/19    Social History:  Social History   Socioeconomic History   Marital status: Married    Spouse name: Matthew Black   Number of children: 2   Years of education: Not on file   Highest education level:  Not on file  Occupational History   Occupation: unemployed 01/30/21  Tobacco Use   Smoking status: Former    Packs/day: 0.00    Pack years: 0.00    Types: Cigarettes    Quit date: 11/24/2015    Years since quitting: 5.4   Smokeless tobacco: Never   Tobacco comments:       Vaping Use   Vaping Use: Never used  Substance and Sexual Activity   Alcohol use: Yes    Comment: socially   Drug use: Never   Sexual activity: Not Currently  Other Topics Concern   Not on file  Social History Narrative   Lives with wife and kids   Right Handed   Drinks >10 cups caffeine daily   Social Determinants of Health   Financial Resource Strain: Not on file  Food Insecurity: Not on file  Transportation Needs: Not on file  Physical Activity: Not on file  Stress: Not on file  Social Connections: Not on file  Intimate Partner Violence: Not on file    Medications:   Current  Outpatient Medications on File Prior to Visit  Medication Sig Dispense Refill   aspirin EC 81 MG tablet Take 1 tablet (81 mg total) by mouth daily. Swallow whole. 30 tablet 11   atorvastatin (LIPITOR) 10 MG tablet TAKE 1 TABLET BY MOUTH EVERY DAY (Patient taking differently: Take 10 mg by mouth daily.) 90 tablet 1   baclofen (LIORESAL) 10 MG tablet Take 1 tablet (10 mg total) by mouth 2 (two) times daily. 60 tablet 1   metoprolol tartrate (LOPRESSOR) 50 MG tablet Take 1 tablet (50 mg total) by mouth daily. 180 tablet 1   No current facility-administered medications on file prior to visit.    Allergies:   Allergies  Allergen Reactions   Hydrocodone Nausea Only    Physical Exam Today's Vitals   05/22/21 1346  BP: 129/84  Pulse: 69  Height: 6' (1.829 m)   Body mass index is 29.84 kg/m.   General: well developed, well nourished pleasant middle-aged African-American male, seated, in no evident distress Head: head normocephalic and atraumatic.  Neck: supple with no carotid or supraclavicular bruits Cardiovascular:  regular rate and rhythm, no murmurs Musculoskeletal: no deformity Skin:  no rash/petichiae Vascular:  Normal pulses all extremities  Neurologic Exam Mental Status: Awake and fully alert.  Fluent speech and language.  Oriented to place and time. Recent and remote memory intact. Attention span, concentration and fund of knowledge appropriate. Mood and affect flat.  Cranial Nerves: Pupils equal, briskly reactive to light. Extraocular movements full without nystagmus. Visual fields full show dense left homonymous hemianopsia to confrontation. Hearing intact. Facial sensation intact.  Mild left lower facial weakness., tongue, palate moves normally and symmetrically.  Motor: Spastic left hemiplegia with nonfixed flexion contractures of left elbow and fingers and left foot drop.  LUE: 2/5 deltoid; 1/5 EE and EF; 2/5 hand with increased tone  LLE: Left lower extremity is 3+-4-/5 HF; 4/5 KE and KF; 0/5 ADF with AFO in place  Sensory.:  Decreased sensory left upper and lower extremity Coordination: Rapid alternating movements performed accurately right side. Finger-to-nose and heel-to-shin performed accurately right side. Gait and Station: Deferred as cane not present at visit Reflexes: 1+ and symmetric. Toes downgoing.      ASSESSMENT: 45 year old African-American male with large right hemispheric infarct due to right internal carotid and middle cerebral artery occlusion with cytotoxic edema and brain herniation s/p hemicraniectomy in 06/2019 is doing reasonably well with residual left spastic hemiparesis and left hemianopia.  Etiology of carotid occlusion unclear possibly dissection.  He had a prolonged hospital admission with several complications including DVT, pulmonary embolism, hemorrhagic transformation, abdominal wall hematoma, UTI but made quite remarkable recovery     PLAN:  -Referral placed to [facility] PT/OT as recommended after recent hospital discharge -Continue to follow with PMR for  spasticity management with Botox as well as left shoulder pain -Continue aspirin and atorvastatin 10 mg daily for secondary stroke prevention -Advised to contact [facility] to schedule CTA head/neck as previously ordered by Dr. Leonie Black -Continue to follow with PCP for aggressive stroke risk factor management including HTN with BP goal<130/90 and HLD with LDL goal<70   Follow-up in 4 months or call earlier if needed   CC:  [facility] provider: Mackie Pai, PA-C   I spent 32 minutes of face-to-face and non-face-to-face time with patient.  This included previsit chart review, lab review, study review, order entry, electronic health record documentation, patient education and discussion regarding prior stroke and secondary stroke prevention measures and aggressive stroke  risk factor management, residual deficits and additional therapy and answered all other questions to patient satisfaction   Frann Rider, [facility]  [facility] [address] [place], [place] [address]  Phone [phone] Fax [phone] Note: This document was prepared with digital dictation and possible smart phrase technology. Any transcriptional errors that result from this process are unintentional.

## 2021-05-23 ENCOUNTER — Other Ambulatory Visit: Payer: Self-pay

## 2021-05-23 ENCOUNTER — Telehealth: Payer: Self-pay | Admitting: Physical Medicine and Rehabilitation

## 2021-05-23 ENCOUNTER — Encounter (HOSPITAL_BASED_OUTPATIENT_CLINIC_OR_DEPARTMENT_OTHER): Payer: Medicaid Other | Admitting: Physical Medicine and Rehabilitation

## 2021-05-23 DIAGNOSIS — M25519 Pain in unspecified shoulder: Secondary | ICD-10-CM

## 2021-05-23 MED ORDER — METHYLPREDNISOLONE 4 MG PO TBPK: ORAL_TABLET | ORAL | 0 refills | Status: DC

## 2021-05-23 NOTE — Progress Notes (Signed)
Subjective:    Patient ID: Matthew Black, male    DOB: 05-31-1976, 45 y.o.   MRN: [id number]  HPI  Due to national recommendations of social distancing because of COVID 47, an audio/video tele-health visit is felt to be the most appropriate encounter for this patient at this time. See MyChart message from today for the patient's consent to a tele-health encounter with [facility]. This is a follow up tele-visit via phone. The patient is at home. MD is at office.    Matthew Black is a 45 year old man who presents for f/u of spasticity, shoulder pain, and aggression.  1) Spasticity: -Botox to pec was very helpul -Pain was gone next day and has not returned -His PT also noticed pain has been much better controlled -He would like to repeat in 2 months -He keeps stretching out left arm with his right and this has helped to keep the arm loose.   2) Aggression: -Discussed that he has follow-up scheduled with Dr. Sima Matas.   3) Shoulder pain -has developed shoulder pain this week that brings him to tears -he says it is hurting all over his shoulder joint -does not feel like the isolated pec pain he has had in the past  Prior history:  Washing dishes and drying them.  Kidney stones resolved following procedure.  IVC filter to be removed soon. He enjoys cooking, working on stairs. He likes to cook spaghetti and eggs. He is able to walk around the the home. He is able to walk outside as well and wife says that he often walks in front of her.  He plays Scrabble with his kids and enjoys spending time with them.  Has started drinking 1 coffee every morning.   He asks about alcohol use. Has been drinking wine, rum.  Wife states that tightness in elbow and knee flexors has been better and improves the more he walks.  Has sense of humor back.  Headaches have resolved.  He has been driving with his parents next to him and has been doing a great job. No issues  except when he tries to get out of the car as he needs he AD at this time. His wife asks about a platform walker for him.   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 His wife accompanies him and provides most of the history.   No longer has pain at surgical site. Wife asks if he should have repeat CT scan. No longer has headache.   Has been having some right sided abdominal pain. Has kidney stone. Has been having daily BM but they are hard. Also has gas.    He has completed his PT and is able to ambulate around his home.   His wife says that prior to his stroke, he liked to play basketball with their kids (ages 16 and 40), play with their dog, travel, and relax at home. He has told her that he is happy to be at home.  He has had tightness in his right elbow and knee flexors and this has been painful to him. He has had two falls and his leg stiffness may have contributed to this.   Pain Inventory Average Pain 0 Pain Right Now 0 My pain is  no pain , left sided weakness. Here for Stroke follow up.  In the last 24 hours, has pain interfered  with the following? General activity 0 Relation with others 0 Enjoyment of life 0 What TIME of day is your pain at its worst?  no pain Sleep (in general) Good  Pain is worse with:  no pain Pain improves with:  na Relief from Meds:  no pain  7 Bowel movements per week No problem with urination.      Family History  Problem Relation Age of Onset   Healthy Mother    Healthy Father    Clotting disorder Maternal Grandmother    Diabetes Paternal Grandfather    Clotting disorder Maternal Aunt    Clotting disorder Maternal Uncle    Colon cancer Neg Hx    Social History   Socioeconomic History   Marital status: Married    Spouse name: Matthew Black   Number of children: 2   Years of education: Not on file   Highest education level: Not on file  Occupational  History   Occupation: unemployed 01/30/21  Tobacco Use   Smoking status: Former    Packs/day: 0.00    Pack years: 0.00    Types: Cigarettes    Quit date: 11/24/2015    Years since quitting: 5.4   Smokeless tobacco: Never   Tobacco comments:       Vaping Use   Vaping Use: Never used  Substance and Sexual Activity   Alcohol use: Yes    Comment: socially   Drug use: Never   Sexual activity: Not Currently  Other Topics Concern   Not on file  Social History Narrative   Lives with wife and kids   Right Handed   Drinks >10 cups caffeine daily   Social Determinants of Health   Financial Resource Strain: Not on file  Food Insecurity: Not on file  Transportation Needs: Not on file  Physical Activity: Not on file  Stress: Not on file  Social Connections: Not on file   Past Surgical History:  Procedure Laterality Date   CRANIOPLASTY Right 06/05/2020   Procedure: CRANIOPLASTY;  Surgeon: Consuella Lose, MD;  Location: [facility];  Service: Neurosurgery;  Laterality: Right;  right   CRANIOTOMY Right 07/19/2019   Procedure: RIGHT HEMI-CRANIECTOMY With implantation of skull flap to abdominal wall;  Surgeon: Consuella Lose, MD;  Location: [facility];  Service: Neurosurgery;  Laterality: Right;   CYST EXCISION N/A 10/08/2016   Procedure: EXCISION OF POSTERIOR NECK CYST;  Surgeon: Clovis Riley, MD;  Location: [facility];  Service: General;  Laterality: N/A;   CYSTOSCOPY/URETEROSCOPY/HOLMIUM LASER/STENT PLACEMENT Right 03/16/2020   Procedure: CYSTOSCOPY RIGHT RETROGRADE PYELOGRAM URETEROSCOPY/HOLMIUM LASER/STENT PLACEMENT;  Surgeon: Lucas Mallow, MD;  Location: [facility];  Service: Urology;  Laterality: Right;   INCISION AND DRAINAGE ABSCESS N/A 09/22/2014   Procedure: INCISION AND DRAINAGE ABSCESS POSTERIOR NECK;  Surgeon: Pedro Earls, MD;  Location: [facility];  Service: General;  Laterality: N/A;   INCISION AND DRAINAGE ABSCESS N/A 12/20/2015   Procedure: INCISION AND DRAINAGE POSTERIOR  NECK MASS;  Surgeon: Armandina Gemma, MD;  Location: [facility];  Service: General;  Laterality: N/A;   INCISION AND DRAINAGE ABSCESS Left 07/10/2004   middle finger   IR IVC FILTER PLMT / S&I /IMG GUID/MOD SED  07/21/2019   IR RADIOLOGIST EVAL & MGMT  12/14/2019   IR RADIOLOGIST EVAL & MGMT  12/21/2020   IR VENOGRAM RENAL UNI RIGHT  07/21/2019   MASS EXCISION N/A 07/21/2017   Procedure: EXCISION OF BENIGN NECK LESION WITH LAYERED CLOSURE;  Surgeon: Irene Limbo, MD;  Location: [facility];  Service: Plastics;  Laterality: N/A;   MASS EXCISION N/A 11/10/2017   Procedure: EXCISION BENIGN LESION OF THE NECK WITH LAYERED CLOSURE;  Surgeon: Irene Limbo, MD;  Location: [facility];  Service: Plastics;  Laterality: N/A;   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  Opioid Risk Score:   Fall Risk Score:  `1  Depression screen PHQ 2/9  Depression screen [facility] 2/9 03/21/2021 01/16/2021 09/26/2020 03/08/2020  Decreased Interest 0 0 0 3  Down, Depressed, Hopeless 0 0 0 3  PHQ - 2 Score 0 0 0 6  Some recent data might be hidden     Review of Systems     Objective:   Physical Exam Not performed as patient was seen via phone     Assessment & Plan:  Mr. Bisig is a a 45 year old man who presents for f/u following his right middle cerebral artery stroke.   1) Headache -Continues to be resolved. Advised that he does not required repeat CT Head unless he develops symptoms and signs of infection or bleed--discussed  which to look out for.   2) Abdominal pain: -Resolved following surgical removal of kidney stones  3) Weakness/spasticity: -Left sided tone much improved after Botox. Will target left pec major again. Will repeat every 3 months. Arm is currently without signficant tone -Encouraged him to keep stretching left arm.  -Discussed transcranial magnetic stimulation as a an option to improve muscle strength. Also discussed [employer] [facility] as an option. Provided links and phones numbers for both options for wife to investigate further.  -Made goal to walk outside 15 minutes per day, start resistance training with low weights.  -Provided script for platform walker -Provided script for occupational therapy.   4) Aggression: -provided referral to see Dr. Sima Matas  5) shoulder pain -prescribed steroid dose pack -if does not help, can consider steroid injection into the joint.   6) MCA stroke: Return to driving: -Once parents clear him after driving next to him > times he may drive on his own.   Cranial incision healing well.   Quality of life: Discussed spending time with his kids, playing games to keep his mind sharp.    All questions were encouraged and answered. Follow up with me in 6 weeks.   4 minutes spent in discussion of patient's pain, prescription of steroid dose pack, requesting that he call me if pain fails to improve

## 2021-05-23 NOTE — Telephone Encounter (Signed)
Patient called and states needs to see Matthew Black early his shoulder is in pain unable to sleep - advised no appts earlier than his July appt - told him I would send message - all he is taking currently is tylenol [phone]

## 2021-05-24 NOTE — Progress Notes (Signed)
I agree with the above plan 

## 2021-05-26 ENCOUNTER — Other Ambulatory Visit: Payer: Self-pay | Admitting: Medical

## 2021-05-29 ENCOUNTER — Telehealth: Payer: Self-pay | Admitting: Adult Health

## 2021-05-29 DIAGNOSIS — I69398 Other sequelae of cerebral infarction: Secondary | ICD-10-CM

## 2021-05-29 DIAGNOSIS — I639 Cerebral infarction, unspecified: Secondary | ICD-10-CM

## 2021-05-29 DIAGNOSIS — I69954 Hemiplegia and hemiparesis following unspecified cerebrovascular disease affecting left non-dominant side: Secondary | ICD-10-CM

## 2021-05-29 NOTE — Telephone Encounter (Addendum)
Contacted pt back, informed him that I called the number provided to him and it went through. Advised him to try the give them a call again and clarified the number again. He will try to call them back.

## 2021-05-29 NOTE — Telephone Encounter (Signed)
Pt called stating the number to home health therapies does not work, pt is requesting a call back. Please advise.

## 2021-05-30 ENCOUNTER — Other Ambulatory Visit: Payer: Self-pay | Admitting: Family

## 2021-05-30 NOTE — Telephone Encounter (Signed)
Pt  has called to report that [facility] informed him they don't have the staff to see him.  Pt stated he was told to give their fax # of [phone] for the referral, please call

## 2021-05-31 ENCOUNTER — Other Ambulatory Visit: Payer: Self-pay | Admitting: Family

## 2021-06-03 NOTE — Telephone Encounter (Signed)
Received this notice from [facility]: "Patient is not homebound per my intake as he is still driving around. Insurance will not cover. Sorry. Can he do outpatient?"  I called pt. He is adamant that he cannot get out of the house.  I will ask [facility] to reconsider.

## 2021-06-03 NOTE — Telephone Encounter (Addendum)
I have reached out to [facility] to see if they can accept this referral.

## 2021-06-04 NOTE — Telephone Encounter (Signed)
[facility] does not accept his insurance. Checking with [facility].

## 2021-06-04 NOTE — Telephone Encounter (Signed)
[facility] declined patient.  [facility] accepted patient. P: [id number]. Service start date 06/08/2021.  I called patient.  I advised him that [facility] accepted him as patient.  They will be calling him in the next 1 to 2 days to schedule his appointments.  Patient verbalized understanding and appreciation.

## 2021-06-04 NOTE — Telephone Encounter (Signed)
[facility] has now informed me that they are not in network with [employer].  I will keep trying with other agencies.

## 2021-06-05 ENCOUNTER — Ambulatory Visit: Payer: Medicaid Other | Admitting: Medical

## 2021-06-10 NOTE — Telephone Encounter (Signed)
[facility] does not have adequate staffing and declined the referral. Checking with [facility].

## 2021-06-10 NOTE — Telephone Encounter (Signed)
[facility] declined referral.

## 2021-06-10 NOTE — Telephone Encounter (Signed)
We have been unsuccessful in finding a agency that will accept patient for home health physical therapy.  Patient will need to go to outpatient physical therapy.  I called patient again to discuss.  No answer, VM is full. Will try again later.

## 2021-06-11 ENCOUNTER — Encounter: Payer: Self-pay | Admitting: Family Medicine

## 2021-06-11 ENCOUNTER — Ambulatory Visit (INDEPENDENT_AMBULATORY_CARE_PROVIDER_SITE_OTHER): Payer: Medicaid Other | Admitting: Family Medicine

## 2021-06-11 ENCOUNTER — Other Ambulatory Visit: Payer: Self-pay

## 2021-06-11 DIAGNOSIS — S46812A Strain of other muscles, fascia and tendons at shoulder and upper arm level, left arm, initial encounter: Secondary | ICD-10-CM

## 2021-06-11 MED ORDER — MELOXICAM 15 MG PO TABS: 15.0000 mg | ORAL_TABLET | Freq: Every day | ORAL | 0 refills | Status: DC

## 2021-06-11 NOTE — Telephone Encounter (Signed)
I called patient again to discuss.  No answer, unable to leave a voicemail as it is full.  I will continue to try.

## 2021-06-11 NOTE — Addendum Note (Signed)
Addended by: Sharon Seller B on: 06/11/2021 04:42 PM   Modules accepted: Orders

## 2021-06-11 NOTE — Addendum Note (Signed)
Addended by: Lester Dearing A on: 06/11/2021 11:15 AM   Modules accepted: Orders

## 2021-06-11 NOTE — Telephone Encounter (Signed)
We have been unsuccessful in finding an [facility] agency for this patient that accepts his insurance and has adequate staffing. [facility], [facility], [facility], [facility], [facility], [facility] have all declined this referral.  Patient returned my call. I discussed this with him. He is agreeable to an outpatient rehab referral for PT/OT. He has seen [facility] in [facility] past. I gave him the phone number to call [facility] and schedule his PT appts. P: [id number].

## 2021-06-11 NOTE — Progress Notes (Signed)
Musculoskeletal Exam  Patient: Matthew Black DOB: [birth year]  DOS: 06/11/2021  SUBJECTIVE:  Chief Complaint:   Chief Complaint  Patient presents with   Shoulder Pain    Left shoulder pain    Fall    Matthew Black is a 45 y.o.  male for evaluation and treatment of L shoulder pain.   Onset:  3 weeks ago. Fell down the steps, around 5 Location: L shoulder/neck Character:  aching and sharp  Progression of issue:  is unchanged Associated symptoms: Stroke caused L sided paralysis Denies: Redness, swelling, bruising Treatment: to date has been rest, oral steroids, and home exercises.   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 No acute distress. Thorax & Lungs: No accessory muscle use Musculoskeletal: L shoulder.   Normal active range of motion: no; no movement at baseline.   Tenderness to palpation: Yes over L trap Deformity: no Ecchymosis: no Neg Spurling's Difficult to assess shoulder jt 2/2 paralysis and poor baseline ROM of L shoulder Neurologic: No spontaneous movement of LUE. 2/4 DTR of L biceps, 1/4 R biceps DTR, no clonus, speech is slow Psychiatric: Normal mood. Age appropriate judgment and insight. Alert & oriented x 3.    Assessment:  Strain of left trapezius muscle, initial encounter - Plan: meloxicam (MOBIC) 15 MG  tablet  Plan: Stretches/exercises, heat, ice, Tylenol. With lack of movement in LUE, will set up home PT for him. I think with his L sided paralysis 2/2 stroke, it would be safer for PT to go to him.  F/u prn. The patient voiced understanding and agreement to the plan.   [place], DO 06/11/21  4:38 PM

## 2021-06-11 NOTE — Telephone Encounter (Signed)
Noted! Thank you

## 2021-06-11 NOTE — Patient Instructions (Signed)
Heat (pad or rice pillow in microwave) over affected area, 10-15 minutes twice daily.   Ice/cold pack over area for 10-15 min twice daily.  If you do not hear anything about your referral in the next 1-2 weeks, call our office and ask for an update.  OK to take Tylenol 1000 mg (2 extra strength tabs) or 975 mg (3 regular strength tabs) every 6 hours as needed.  Let us know if you need anything.  Trapezius stretches/exercises Do exercises exactly as told by your health care provider and adjust them as directed. It is normal to feel mild stretching, pulling, tightness, or discomfort as you do these exercises, but you should stop right away if you feel sudden pain or your pain gets worse.   Stretching and range of motion exercises These exercises warm up your muscles and joints and improve the movement and flexibility of your shoulder. These exercises can also help to relieve pain, numbness, and tingling. If you are unable to do any of the following for any reason, do not further attempt to do it.   Exercise A: Flexion, standing     Stand and hold a broomstick, a cane, or a similar object. Place your hands a little more than shoulder-width apart on the object. Your left / right hand should be palm-up, and your other hand should be palm-down. Push the stick to raise your left / right arm out to your side and then over your head. Use your other hand to help move the stick. Stop when you feel a stretch in your shoulder, or when you reach the angle that is recommended by your health care provider. Avoid shrugging your shoulder while you raise your arm. Keep your shoulder blade tucked down toward your spine. Hold for 30 seconds. Slowly return to the starting position. Repeat 2 times. Complete this exercise 3 times per week.  Exercise B: Abduction, supine     Lie on your back and hold a broomstick, a cane, or a similar object. Place your hands a little more than shoulder-width apart on the  object. Your left / right hand should be palm-up, and your other hand should be palm-down. Push the stick to raise your left / right arm out to your side and then over your head. Use your other hand to help move the stick. Stop when you feel a stretch in your shoulder, or when you reach the angle that is recommended by your health care provider. Avoid shrugging your shoulder while you raise your arm. Keep your shoulder blade tucked down toward your spine. Hold for 30 seconds. Slowly return to the starting position. Repeat 2 times. Complete this exercise 3 times per week.  Exercise C: Flexion, active-assisted     Lie on your back. You may bend your knees for comfort. Hold a broomstick, a cane, or a similar object. Place your hands about shoulder-width apart on the object. Your palms should face toward your feet. Raise the stick and move your arms over your head and behind your head, toward the floor. Use your healthy arm to help your left / right arm move farther. Stop when you feel a gentle stretch in your shoulder, or when you reach the angle where your health care provider tells you to stop. Hold for 30 seconds. Slowly return to the starting position. Repeat 2 times. Complete this exercise 3 times per week.  Exercise D: External rotation and abduction     Stand in a door frame with one  of your feet slightly in front of the other. This is called a staggered stance. Choose one of the following positions as told by your health care provider: Place your hands and forearms on the door frame above your head. Place your hands and forearms on the door frame at the height of your head. Place your hands on the door frame at the height of your elbows. Slowly move your weight onto your front foot until you feel a stretch across your chest and in the front of your shoulders. Keep your head and chest upright and keep your abdominal muscles tight. Hold for 30 seconds. To release the stretch, shift your  weight to your back foot. Repeat 2 times. Complete this stretch 3 times per week.  Strengthening exercises These exercises build strength and endurance in your shoulder. Endurance is the ability to use your muscles for a long time, even after your muscles get tired. Exercise E: Scapular depression and adduction  Sit on a stable chair. Support your arms in front of you with pillows, armrests, or a tabletop. Keep your elbows in line with the sides of your body. Gently move your shoulder blades down toward your middle back. Relax the muscles on the tops of your shoulders and in the back of your neck. Hold for 3 seconds. Slowly release the tension and relax your muscles completely before doing this exercise again. Repeat for a total of 10 repetitions. After you have practiced this exercise, try doing the exercise without the arm support. Then, try the exercise while standing instead of sitting. Repeat 2 times. Complete this exercise 3 times per week.  Exercise F: Shoulder abduction, isometric     Stand or sit about 4-6 inches (10-15 cm) from a wall with your left / right side facing the wall. Bend your left / right elbow and gently press your elbow against the wall. Increase the pressure slowly until you are pressing as hard as you can without shrugging your shoulder. Hold for 3 seconds. Slowly release the tension and relax your muscles completely. Repeat for a total of 10 repetitions. Repeat 2 times. Complete this exercise 3 times per week.  Exercise G: Shoulder flexion, isometric     Stand or sit about 4-6 inches (10-15 cm) away from a wall with your left / right side facing the wall. Keep your left / right elbow straight and gently press the top of your fist against the wall. Increase the pressure slowly until you are pressing as hard as you can without shrugging your shoulder. Hold for 10-15 seconds. Slowly release the tension and relax your muscles completely. Repeat for a total of 10  repetitions. Repeat 2 times. Complete this exercise 3 times per week.  Exercise H: Internal rotation     Sit in a stable chair without armrests, or stand. Secure an exercise band at your left / right side, at elbow height. Place a soft object, such as a folded towel or a small pillow, under your left / right upper arm so your elbow is a few inches (about 8 cm) away from your side. Hold the end of the exercise band so the band stretches. Keeping your elbow pressed against the soft object under your arm, move your forearm across your body toward your abdomen. Keep your body steady so the movement is only coming from your shoulder. Hold for 3 seconds. Slowly return to the starting position. Repeat for a total of 10 repetitions. Repeat 2 times. Complete this exercise 3  times per week.  Exercise I: External rotation     Sit in a stable chair without armrests, or stand. Secure an exercise band at your left / right side, at elbow height. Place a soft object, such as a folded towel or a small pillow, under your left / right upper arm so your elbow is a few inches (about 8 cm) away from your side. Hold the end of the exercise band so the band stretches. Keeping your elbow pressed against the soft object under your arm, move your forearm out, away from your abdomen. Keep your body steady so the movement is only coming from your shoulder. Hold for 3 seconds. Slowly return to the starting position. Repeat for a total of 10 repetitions. Repeat 2 times. Complete this exercise 3 times per week. Exercise J: Shoulder extension  Sit in a stable chair without armrests, or stand. Secure an exercise band to a stable object in front of you so the band is at shoulder height. Hold one end of the exercise band in each hand. Your palms should face each other. Straighten your elbows and lift your hands up to shoulder height. Step back, away from the secured end of the exercise band, until the band  stretches. Squeeze your shoulder blades together and pull your hands down to the sides of your thighs. Stop when your hands are straight down by your sides. Do not let your hands go behind your body. Hold for 3 seconds. Slowly return to the starting position. Repeat for a total of 10 repetitions. Repeat 2 times. Complete this exercise 3 times per week.  Exercise K: Shoulder extension, prone     Lie on your abdomen on a firm surface so your left / right arm hangs over the edge. Hold a 5 lb weight in your hand so your palm faces in toward your body. Your arm should be straight. Squeeze your shoulder blade down toward the middle of your back. Slowly raise your arm behind you, up to the height of the surface that you are lying on. Keep your arm straight. Hold for 3 seconds. Slowly return to the starting position and relax your muscles. Repeat for a total of 10 repetitions. Repeat 2 times. Complete this exercise 3 times per week.   Exercise L: Horizontal abduction, prone  Lie on your abdomen on a firm surface so your left / right arm hangs over the edge. Hold a 5 lb weight in your hand so your palm faces toward your feet. Your arm should be straight. Squeeze your shoulder blade down toward the middle of your back. Bend your elbow so your hand moves up, until your elbow is bent to an "L" shape (90 degrees). With your elbow bent, slowly move your forearm forward and up. Raise your hand up to the height of the surface that you are lying on. Your upper arm should not move, and your elbow should stay bent. At the top of the movement, your palm should face the floor. Hold for 3 seconds. Slowly return to the starting position and relax your muscles. Repeat for a total of 10 repetitions. Repeat 2 times. Complete this exercise 3 times per week.  Exercise M: Horizontal abduction, standing  Sit on a stable chair, or stand. Secure an exercise band to a stable object in front of you so the band is at  shoulder height. Hold one end of the exercise band in each hand. Straighten your elbows and lift your hands straight in front  of you, up to shoulder height. Your palms should face down, toward the floor. Step back, away from the secured end of the exercise band, until the band stretches. Move your arms out to your sides, and keep your arms straight. Hold for 3 seconds. Slowly return to the starting position. Repeat for a total of 10 repetitions. Repeat 2 times. Complete this exercise 3 times per week.  Exercise N: Scapular retraction and elevation  Sit on a stable chair, or stand. Secure an exercise band to a stable object in front of you so the band is at shoulder height. Hold one end of the exercise band in each hand. Your palms should face each other. Sit in a stable chair without armrests, or stand. Step back, away from the secured end of the exercise band, until the band stretches. Squeeze your shoulder blades together and lift your hands over your head. Keep your elbows straight. Hold for 3 seconds. Slowly return to the starting position. Repeat for a total of 10 repetitions. Repeat 2 times. Complete this exercise 3 times per week.  This information is not intended to replace advice given to you by your health care provider. Make sure you discuss any questions you have with your health care provider. Document Released: 11/10/2005 Document Revised: 07/17/2016 Document Reviewed: 09/27/2015 Elsevier Interactive Patient Education  2017 [employer].

## 2021-06-12 ENCOUNTER — Other Ambulatory Visit: Payer: Self-pay | Admitting: Medical

## 2021-06-12 ENCOUNTER — Other Ambulatory Visit: Payer: Self-pay | Admitting: Diagnostic Radiology

## 2021-06-12 DIAGNOSIS — Z95828 Presence of other vascular implants and grafts: Secondary | ICD-10-CM

## 2021-06-16 ENCOUNTER — Other Ambulatory Visit: Payer: Self-pay | Admitting: Medical

## 2021-06-18 ENCOUNTER — Encounter: Payer: Self-pay | Admitting: Physical Medicine and Rehabilitation

## 2021-06-18 ENCOUNTER — Ambulatory Visit: Payer: Medicaid Other | Admitting: Physical Medicine and Rehabilitation

## 2021-06-18 ENCOUNTER — Other Ambulatory Visit: Payer: Self-pay

## 2021-06-18 ENCOUNTER — Encounter: Payer: Medicaid Other | Admitting: Physical Medicine and Rehabilitation

## 2021-06-18 ENCOUNTER — Encounter
Payer: Medicaid Other | Attending: Physical Medicine and Rehabilitation | Admitting: Physical Medicine and Rehabilitation

## 2021-06-18 VITALS — BP 136/90 | HR 71 | Temp 98.7°F | Ht 71.0 in | Wt 220.0 lb

## 2021-06-18 DIAGNOSIS — R252 Cramp and spasm: Secondary | ICD-10-CM | POA: Diagnosis not present

## 2021-06-18 DIAGNOSIS — I69398 Other sequelae of cerebral infarction: Secondary | ICD-10-CM | POA: Insufficient documentation

## 2021-06-18 NOTE — Progress Notes (Signed)
Botox Injection for spasticity and left sided UE internal rotation using ultrasound guidance  Indication: Severe spasticity which interferes with ADL,mobility and/or  hygiene and is unresponsive to medication management and other conservative care Informed consent was obtained after describing risks and benefits of the procedure with the patient. This includes bleeding, bruising, infection, excessive weakness, or medication side effects. A REMS form is on file and signed. Needle: 25cc needle Number of units per muscle Left pectoralis major: 100U in 2 sites  100 U injected into 2 sites All injections were done after obtaining appropriate ultrasound visualization (pictures taken) and after negative drawback for blood. The patient tolerated the procedure well. Post procedure instructions were given. A followup appointment was made.

## 2021-06-26 ENCOUNTER — Other Ambulatory Visit: Payer: Self-pay

## 2021-06-26 ENCOUNTER — Telehealth: Payer: Self-pay

## 2021-06-26 ENCOUNTER — Ambulatory Visit: Payer: Medicaid Other | Attending: Family Medicine | Admitting: Physical Therapy

## 2021-06-26 ENCOUNTER — Telehealth: Payer: Self-pay | Admitting: *Deleted

## 2021-06-26 ENCOUNTER — Encounter: Payer: Self-pay | Admitting: Physical Therapy

## 2021-06-26 ENCOUNTER — Ambulatory Visit: Payer: Medicaid Other | Admitting: Occupational Therapy

## 2021-06-26 ENCOUNTER — Encounter: Payer: Self-pay | Admitting: Occupational Therapy

## 2021-06-26 DIAGNOSIS — R2681 Unsteadiness on feet: Secondary | ICD-10-CM | POA: Diagnosis not present

## 2021-06-26 DIAGNOSIS — R41840 Attention and concentration deficit: Secondary | ICD-10-CM | POA: Diagnosis not present

## 2021-06-26 DIAGNOSIS — R2689 Other abnormalities of gait and mobility: Secondary | ICD-10-CM | POA: Diagnosis not present

## 2021-06-26 DIAGNOSIS — M25612 Stiffness of left shoulder, not elsewhere classified: Secondary | ICD-10-CM | POA: Diagnosis not present

## 2021-06-26 DIAGNOSIS — R208 Other disturbances of skin sensation: Secondary | ICD-10-CM | POA: Insufficient documentation

## 2021-06-26 DIAGNOSIS — R293 Abnormal posture: Secondary | ICD-10-CM

## 2021-06-26 DIAGNOSIS — M25512 Pain in left shoulder: Secondary | ICD-10-CM | POA: Insufficient documentation

## 2021-06-26 DIAGNOSIS — I69354 Hemiplegia and hemiparesis following cerebral infarction affecting left non-dominant side: Secondary | ICD-10-CM

## 2021-06-26 DIAGNOSIS — G8929 Other chronic pain: Secondary | ICD-10-CM | POA: Diagnosis not present

## 2021-06-26 DIAGNOSIS — M6281 Muscle weakness (generalized): Secondary | ICD-10-CM | POA: Insufficient documentation

## 2021-06-26 NOTE — Therapy (Signed)
[facility] [address] [place], [place], [address] Phone: [phone]   Fax:  [phone]  Physical Therapy Evaluation  Patient Details  Name: Matthew Black MRN: [id number] Date of Birth: Feb 07, 1976 Referring Provider (PT): Frann Rider, NP   Encounter Date: 06/26/2021   PT End of Session - 06/26/21 1513     Visit Number 1    Number of Visits 7    Date for PT Re-Evaluation 08/07/21    Authorization Type Medicaid [employer]; has used 14 visits so far of PT/OT in 2022. OT currently requesting 6 visits for new POC    Authorization Time Period Auth sent 06/26/21 for initial 3 visits; requesting 6 visits total of PT for new POC    PT Start Time 1530    PT Stop Time [id number]    PT Time Calculation (min) 45 min    Equipment Utilized During Treatment Gait belt    Activity Tolerance Patient tolerated treatment well    Behavior During Therapy WFL for tasks assessed/performed             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   Depression    DVT (deep venous thrombosis) ([place])    BLE DVT 07/21/19, 08/02/19; s/p retrievable IVC filter 07/21/19   Hemorrhagic stroke ([place])    History of kidney stones    Hypertension    Paralysis ([place])    LEFT SIDE   PE (pulmonary thromboembolism) ([place])    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   Stroke ([place])    RICA, R A1, R MCA occlusion 07/19/19    Past Surgical History:  Procedure Laterality Date   CRANIOPLASTY Right 06/05/2020   Procedure: CRANIOPLASTY;  Surgeon: Consuella Lose, MD;  Location: [facility];  Service: Neurosurgery;  Laterality: Right;  right   CRANIOTOMY Right 07/19/2019   Procedure: RIGHT HEMI-CRANIECTOMY With implantation of skull flap to abdominal wall;  Surgeon: Consuella Lose, MD;  Location: [facility];  Service: Neurosurgery;  Laterality: Right;   CYST EXCISION N/A 10/08/2016   Procedure: EXCISION OF  POSTERIOR NECK CYST;  Surgeon: Clovis Riley, MD;  Location: [facility];  Service: General;  Laterality: N/A;   CYSTOSCOPY/URETEROSCOPY/HOLMIUM LASER/STENT PLACEMENT Right 03/16/2020   Procedure: CYSTOSCOPY RIGHT RETROGRADE PYELOGRAM URETEROSCOPY/HOLMIUM LASER/STENT PLACEMENT;  Surgeon: Lucas Mallow, MD;  Location: [facility];  Service: Urology;  Laterality: Right;   INCISION AND DRAINAGE ABSCESS N/A 09/22/2014   Procedure: INCISION AND DRAINAGE ABSCESS POSTERIOR NECK;  Surgeon: Pedro Earls, MD;  Location: [facility];  Service: General;  Laterality: N/A;   INCISION AND DRAINAGE ABSCESS N/A 12/20/2015   Procedure: INCISION AND DRAINAGE POSTERIOR NECK MASS;  Surgeon: Armandina Gemma, MD;  Location: [facility];  Service: General;  Laterality: N/A;   INCISION AND DRAINAGE ABSCESS Left 07/10/2004   middle finger   IR IVC FILTER PLMT / S&I /IMG GUID/MOD SED  07/21/2019   IR RADIOLOGIST EVAL & MGMT  12/14/2019   IR RADIOLOGIST EVAL & MGMT  12/21/2020   IR VENOGRAM RENAL UNI RIGHT  07/21/2019   MASS EXCISION N/A 07/21/2017   Procedure: EXCISION OF BENIGN NECK LESION WITH LAYERED CLOSURE;  Surgeon: Irene Limbo, MD;  Location: [facility];  Service: Plastics;  Laterality: N/A;   MASS EXCISION N/A 11/10/2017   Procedure: EXCISION BENIGN LESION OF THE NECK WITH LAYERED CLOSURE;  Surgeon: Irene Limbo, MD;  Location: [facility];  Service: Clinical cytogeneticist;  Laterality: N/A;    There were no vitals filed for this visit.    Subjective Assessment - 06/26/21 1530     Subjective Pt last seen by PT in Feb of 2022. Pt had fall May 2022 and found to have COVID. Pt states he feels that he is doing everything the same since before the fall and is back to his normal level of function. Pt had been working on stairs with PT during his last episodes; reports he does not have a handrail on the left side of his steps to attempt it at home but he is trying to find someone to install a handrail. Pt  states he's been doing the PT exercises given to him at home. Pt states he feels he needs PT for declines and inclines on hills/ramps (has a hill on his drive way that he wants to be able to walk on). Has a ramp at church and he feels scared of it. Pt states he would like to increase his walking distance as well. Pt notes he has a [employer].    Pertinent History CVA (previous R MCA CVA), hypercoagulable state, history of PEs and DVT, hypertension    Limitations Walking;House hold activities;Standing    How long can you walk comfortably? Around the house only    Patient Stated Goals Increase walking; improve confidence with ramps    Currently in Pain? No/denies                [facility] PT Assessment - 06/26/21 1514       Assessment   Medical Diagnosis s/p Botox 7/26, s/p fall 5/28 with COVID h/o stroke    Referring Provider (PT) Frann Rider, NP    Onset Date/Surgical Date 04/20/21   hospital for weakness/fall, (+) COVID   Hand Dominance Right    Prior Therapy Last saw PT Feb 2022      Precautions   Precautions Fall      Restrictions   Weight Bearing Restrictions No      Balance Screen   Has the patient fallen in the past 6 months Yes    How many times? 1    Has the patient had a decrease in activity level because of a fear of falling?  Yes    Is the patient reluctant to leave their home because of a fear of falling?  Yes      [place] Private residence    Living Arrangements Spouse/significant other;Children    Available Help at Discharge Family    Type of [place] entrance    [place] Two level    Alternate Level Stairs-Number of Steps 17    Alternate Level Stairs-Rails Left      Prior Function   Level of Independence Independent with basic ADLs;Needs assistance with gait      Cognition   Overall Cognitive Status Within Functional Limits for tasks assessed   Requires increased time for processing      Sensation   Light Touch Impaired by gross assessment   Can feel more on R     Tone   Assessment Location Left Lower Extremity      ROM / Strength   AROM / PROM / Strength Strength      Strength   Overall Strength Comments L hip 3/5; abduction 3+/5 sitting; L knee flexion 2+/5 (limited by tone), extension 2+/5 (limited by tone)  Ambulation/Gait   Ambulation Distance (Feet) 161 Feet    Assistive device Large base quad cane;Other (Comment)   AFO   Gait Pattern Step-to pattern;Decreased stance time - left;Decreased arm swing - left;Decreased dorsiflexion - left;Decreased weight shift to left;Left foot flat;Abducted - left    Ambulation Surface Level;Indoor    Gait velocity 0.2 m/s    Ramp 4: Min assist    Ramp Details (indicate cue type and reason) CGA on ascending; min A descending to minimize increased forward COG    Gait Comments tolerated amb 4'09"      Standardized Balance Assessment   Standardized Balance Assessment Timed Up and Go Test      Timed Up and Go Test   Normal TUG (seconds) 38   uncontrolled descent to w/c with use of UEs     High Level Balance   High Level Balance Comments Standing balance on ramp ascending: 30 sec; on ramp descending: 20 sec (limited by fatigue)      LUE Tone   LUE Tone Moderate;Hypertonic      LLE Tone   LLE Tone Moderate;Hypertonic   noted especially with knee extension     LLE Tone   Hypertonic Details +2 catch at end range with MAS for knee extension                        Objective measurements completed on examination: See above findings.               PT Education - 06/26/21 1620     Education Details Exam findings and POC    Person(s) Educated Patient    Methods Explanation;Verbal cues;Handout;Demonstration;Tactile cues    Comprehension Verbalized understanding;Returned demonstration;Verbal cues required;Tactile cues required              PT Short Term Goals - 06/26/21 1637       PT SHORT  TERM GOAL #1   Title STG=LTGs               PT Long Term Goals - 06/26/21 1633       PT LONG TERM GOAL #1   Title Pt will increase gait speed from 0.159ms to >0.246m for improved gait safety.    Baseline 0.197 m/s    Time 6    Period Weeks    Status New    Target Date 08/07/21      PT LONG TERM GOAL #2   Title Pt will be able to negotiate incline/decline for at least 10' with quad cane and SBA for improved ability to use church ramp/attain pt's goal to walk up/down his driveway    Baseline Min A on small decline during descent    Time 6    Period Weeks    Status New    Target Date 08/07/21      PT LONG TERM GOAL #3   Title Pt will ambulate >250' with LRAD CGA for improved limited community amb.    Baseline 161' with large based quad cane CGA    Time 6    Period Weeks    Status New    Target Date 08/07/21      PT LONG TERM GOAL #4   Title Pt will have decreased TUG to </=30 sec to demo improved gait    Baseline 38 sec    Time 6    Period Weeks    Status New    Target Date 08/07/21  Plan - 06/26/21 1628     Clinical Impression Statement Matthew Black is a 45 y/o M presenting back to [facility] after fall and COVID in May 2022. Pt was previously seen at this clinic in Feb 2022 and was d/c-ed with HEP. Pt notes he has been doing his HEP. Rechecked pt's prior LTGs and pt appears to have some improved performance with his TUG score and his amb distance since Feb. Pt with very specific goals to improve his ability and safety to manage inclines/declines for hills/ramps as well as continue to increase his amb distance. On assessment, pt demos continued L LE weakness, decreased activity tolerance, decreased L LE sensation and decreased balance affecting his ability to meet his goals independently. Pt would benefit from PT to improve his community level of mobility.    Personal Factors and Comorbidities Age;Time since onset of  injury/illness/exacerbation;Comorbidity 1;Comorbidity 3+;Comorbidity 2    Comorbidities CVA (previous R MCA CVA), hypercoagulable state, history of PEs and DVT, hypertension    Examination-Activity Limitations Locomotion Level;Stairs    Examination-Participation Restrictions Church;Community Activity;Shop    Stability/Clinical Decision Making Evolving/Moderate complexity    Clinical Decision Making Moderate    Rehab Potential Good    PT Frequency 1x / week    PT Duration 6 weeks    PT Treatment/Interventions Aquatic Therapy;ADLs/Self Care Home Management;DME Instruction;Gait training;Stair training;Functional mobility training;Therapeutic activities;Therapeutic exercise;Balance training;Neuromuscular re-education;Orthotic Fit/Training;Patient/family education;Manual techniques;Dry needling;Energy conservation;Taping    PT Next Visit Plan Work on pt's endurance with walking (try interval training). Work on standing balance on ramp. Continue to work on increasing L LE weightbearing. Review his HEP from d/c in Feb as needed.    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)  Muscle weakness (generalized)  Unsteadiness on feet  Other abnormalities of gait and mobility     Problem List Patient Active Problem List   Diagnosis Date Noted   COVID-19 virus infection 04/21/2021   Left-sided weakness 04/21/2021   S/P craniotomy 06/05/2020   History of cranioplasty 06/05/2020   Peri-rectal abscess 02/14/2020   Abnormal CT scan, pelvis 02/14/2020   Pancytopenia ([facility] [facility]) 12/02/2019   Nephrolithiasis  12/02/2019   Hydronephrosis with renal and ureteral calculus obstruction 12/02/2019   Rectal pain 12/02/2019   Hyperkalemia 12/02/2019   Reactive depression    Wound infection after surgery    Sleep disturbance    Dysphagia, post-stroke    Transaminitis    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   Cerebral abscess    Urinary tract infection without hematuria    Altered mental status    Primary hypercoagulable state ([facility])    Acute pulmonary embolism without acute cor pulmonale (HCC)    Deep vein thrombosis (DVT) of non-extremity vein    Hypokalemia    Acute blood loss anemia    Leukocytosis    Endotracheal tube present    Acute respiratory failure with hypoxemia (HCC)    Stroke (cerebrum) ([place]) 07/19/2019   Pressure injury of skin 07/19/2019   Acute CVA (cerebrovascular accident) ([place])    Encephalopathy    Dysphagia    Acute encephalopathy    Essential hypertension  Obesity 03/12/2016   Scalp abscess 12/20/2015   Neck abscess 12/20/2015   Pilonidal cyst 02/08/2013    Check all possible CPT codes: 97110- Therapeutic Exercise, [phone]- Neuro Re-education, [phone] - Gait Training, [phone] - Manual Therapy, [phone] - Therapeutic Activities, [phone] - [place], [place], and [phone] - Aquatic therapy         Bailee Metter April Ma L Munday PT, DPT 06/26/2021, 4:45 PM  [facility] [address] [place], [place], [address] Phone: [phone]   Fax:  [phone]  Name: Matthew Black MRN: [id number] Date of Birth: 30-Jul-1976

## 2021-06-26 NOTE — Therapy (Signed)
[facility] [address] [place], [place], [address] Phone: [phone]   Fax:  [phone]  Occupational Therapy Evaluation  Patient Details  Name: TASHA BUSSIERE MRN: [id number] Date of Birth: 03/13/1976 Referring Provider (OT): Frann Rider   Encounter Date: 06/26/2021   OT End of Session - 06/26/21 1713     Visit Number 1    Number of Visits 7    Date for OT Re-Evaluation 09/09/21    Authorization Type [employer]    Progress Note Due on Visit 10    OT Start Time 1450    OT Stop Time 1530    OT Time Calculation (min) 40 min    Activity Tolerance Patient tolerated treatment well    Behavior During Therapy [facility] for tasks assessed/performed             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   Depression    DVT (deep venous thrombosis) ([place])    BLE DVT 07/21/19, 08/02/19; s/p retrievable IVC filter 07/21/19   Hemorrhagic stroke ([place])    History of kidney stones    Hypertension    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   PE (pulmonary thromboembolism) ([place])    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   Stroke ([place])    RICA, R A1, R MCA occlusion 07/19/19    Past Surgical History:  Procedure Laterality Date   CRANIOPLASTY Right 06/05/2020   Procedure: CRANIOPLASTY;  Surgeon: Consuella Lose, MD;  Location: [place];  Service: Neurosurgery;  Laterality: Right;  right   CRANIOTOMY Right 07/19/2019   Procedure: RIGHT HEMI-CRANIECTOMY With implantation of skull flap to abdominal wall;  Surgeon: Consuella Lose, MD;  Location: [place];  Service: Neurosurgery;  Laterality: Right;   CYST EXCISION N/A 10/08/2016   Procedure: EXCISION OF POSTERIOR NECK CYST;  Surgeon: Clovis Riley, MD;  Location: [facility];  Service: General;  Laterality: N/A;   CYSTOSCOPY/URETEROSCOPY/HOLMIUM LASER/STENT PLACEMENT Right 03/16/2020   Procedure: CYSTOSCOPY RIGHT RETROGRADE  PYELOGRAM URETEROSCOPY/HOLMIUM LASER/STENT PLACEMENT;  Surgeon: Lucas Mallow, MD;  Location: [facility];  Service: Urology;  Laterality: Right;   INCISION AND DRAINAGE ABSCESS N/A 09/22/2014   Procedure: INCISION AND DRAINAGE ABSCESS POSTERIOR NECK;  Surgeon: Pedro Earls, MD;  Location[facility];  Service: General;  Laterality: N/A;   INCISION AND DRAINAGE ABSCESS N/A 12/20/2015   Procedure: INCISION AND DRAINAGE POSTERIOR NECK MASS;  Surgeon: Armandina Gemma, MD;  Location: [facility];  Service: General;  Laterality: N/A;   INCISION AND DRAINAGE ABSCESS Left 07/10/2004   middle finger   IR IVC FILTER PLMT / S&I /IMG GUID/MOD SED  07/21/2019   IR RADIOLOGIST EVAL & MGMT  12/14/2019   IR RADIOLOGIST EVAL & MGMT  12/21/2020   IR VENOGRAM RENAL UNI RIGHT  07/21/2019   MASS EXCISION N/A 07/21/2017   Procedure: EXCISION OF BENIGN NECK LESION WITH LAYERED CLOSURE;  Surgeon: Irene Limbo, MD;  Location: [place];  Service: Plastics;  Laterality: N/A;   MASS EXCISION N/A 11/10/2017   Procedure: EXCISION BENIGN LESION OF THE NECK WITH LAYERED CLOSURE;  Surgeon: Irene Limbo, MD;  Location: [place];  Service: Plastics;  Laterality: N/A;    There were no vitals filed for this visit.   Subjective Assessment - 06/26/21 1457     Subjective  I have been gardening and grilling    Currently in Pain?  No/denies               [facility] OT Assessment - 06/26/21 0001       Assessment   Medical Diagnosis s/p Botox 7/26, s/p fall 5/28 with COVID h/o stroke    Referring Provider (OT) Frann Rider    Onset Date/Surgical Date 06/11/21   referral   Hand Dominance Right    Prior Therapy d/c April 2022      Precautions   Precautions Fall      Balance Screen   Has the patient fallen in the past 6 months Yes    How many times? 1    Has the patient had a decrease in activity level because of a fear of falling?  No    Is the patient reluctant to leave their home because  of a fear of falling?  No      Prior Function   Level of Independence Needs assistance with ADLs      ADL   Eating/Feeding Minimal assistance    Grooming Modified independent    Upper Body Bathing Modified independent    Lower Body Bathing Modified independent    Upper Body Dressing Increased time    Lower Body Dressing Moderate assistance    Toilet Transfer Modified independent    Toileting - Clothing Manipulation Modified independent    [place] Transfer Other (comment)   unable to go up/down stairs     IADL   Prior Level of Function Shopping Independent    Shopping Takes care of all shopping needs independently   orders groceries on line and picks up   Prior Level of Function Light Housekeeping Independent    Light Housekeeping Performs light daily tasks such as dishwashing, bed making    Prior Level of Function Meal Prep Independent    Meal Prep Able to complete simple warm meal prep    Prior Level of Function Passenger transport manager own vehicle    Prior Level of Function Medication Managment Independent    Medication Management Is responsible for taking medication in correct dosages at correct time      Written Expression   Dominant Hand Right    Handwriting --   unable     Cognition   Overall Cognitive Status Impaired/Different from baseline    Cognition Comments Very delayed response, but improved accuracy of response      Observation/Other Assessments   Focus on Therapeutic Outcomes (FOTO)  NA      Posture/Postural Control   Posture/Postural Control Postural limitations    Postural Limitations Rounded Shoulders;Posterior pelvic tilt;Flexed trunk;Weight shift left      Sensation   Light Touch Impaired by gross assessment    Additional Comments minimal sensation in digits      Coordination   Gross Motor Movements are Fluid and Coordinated No    Fine Motor Movements are Fluid and  Coordinated No    9 Hole Peg Test --   unable   Box and Blocks unable    Coordination No volitional movement in RUE      Perception   Perception Impaired    Spatial Orientation Resists forward and right weight shifts      Praxis   Praxis Impaired      Tone   Assessment Location Left Upper Extremity      ROM / Strength   AROM / PROM / Strength PROM  PROM   Overall PROM  Deficits    Overall PROM Comments 70* shoulder flexion then pain, limited end range extension digits      LUE Tone   LUE Tone Moderate;Hypertonic      LUE Tone   Hypertonic Details Botox pec major 7/26   pain in left shoulder - anterior                            OT Education - 06/26/21 1713     Education Details potential plan of care, aquatic therapy, possible goals for OT    Person(s) Educated Patient    Methods Explanation    Comprehension Verbalized understanding              OT Short Term Goals - 06/26/21 1733       OT SHORT TERM GOAL #1   Title Patient will complete an updated HEP to ensure management of RUE    Baseline Has prior program, now with fall / new pain - need to establish new program    Time 4    Period Weeks    Status New    Target Date 08/10/21      OT SHORT TERM GOAL #2   Title Patient will don sock on left foot with mod assist    Baseline dependent    Time 4    Period Weeks    Status New      OT SHORT TERM GOAL #3   Title Patient will understand positioning for left shoulder for pain relief in bed or wheelchair    Baseline needs occasional assistance to reposition in bed    Time 4    Period Weeks      OT SHORT TERM GOAL #4   Title Patient will demonstrate 90 degrees of sholder flexion passively without increase in pain    Baseline 70 degrees    Time 4    Period Weeks    Status New               OT Long Term Goals - 06/26/21 1733       OT LONG TERM GOAL #1   Title Patient will complete an aquatic exercise program he can  complete at his gym with support of family member    Baseline not yet established    Time 6    Period Weeks    Status New      OT LONG TERM GOAL #2   Title Patient will don AFO onto RLE with min assist    Baseline dependent    Time 6    Period Weeks    Status New      OT LONG TERM GOAL #3   Title Patient will don left shoe with mod assistance    Baseline dependent    Time 6    Period Weeks    Status New      OT LONG TERM GOAL #4   Title Patient will demonstrate awareness of compensatory strategies or adaptive equipment to increase independence and safety with cutting and peeling foods    Baseline needs assistance    Time 6    Period Weeks    Status New      OT LONG TERM GOAL #5   Title xxx                   Plan - 06/26/21 1714     Clinical  Impression Statement Patient is a 45 yr old male well known to this clinician from prior episodes of care.  Patient had COVID leading to weakness and fall down stairs in May 2022.  Patient injured right shoulder in fall. Patient had recent Botox injection to right pec major in July 2022.  Patient presents to OT evaluation with improved independence in community mobility - now driving himself, in medication management, and in his ability to cook and grill for his family.  Patient needs assistance for dressing, and for some aspects of meal preparation - eg cutting/ peeling, patient has pain in right shoulder from recent fall, and increased tension in right hand.  Patient will benefit from skilled OT intervention to increase independence with ADL/IADL and help him return to community exercise.    OT Occupational Profile and History Detailed Assessment- Review of Records and additional review of physical, cognitive, psychosocial history related to current functional performance    Occupational performance deficits (Please refer to evaluation for details): ADL's;IADL's;Work    Body Structure / Function / Physical Skills  ADL;Strength;Balance;GMC;Pain;Tone;Body mechanics;Edema;Proprioception;UE functional use;Endurance;IADL;ROM;Coordination;Flexibility;Mobility;Sensation;Decreased knowledge of precautions;FMC    Rehab Potential Good    Clinical Decision Making Several treatment options, min-mod task modification necessary    Comorbidities Affecting Occupational Performance: May have comorbidities impacting occupational performance    Modification or Assistance to Complete Evaluation  No modification of tasks or assist necessary to complete eval    OT Frequency 1x / week    OT Duration 6 weeks    OT Treatment/Interventions Self-care/ADL training;Aquatic Therapy;Moist Heat;DME and/or AE instruction;Splinting;Balance training;Therapeutic activities;Therapeutic exercise;Neuromuscular education;Functional Mobility Training;Patient/family education;Manual Therapy;Electrical Stimulation    Plan Discuss aquatic therapy - needs exercise program, resting hand splint, review current stretching program - update as indicated, ADL - Compensations for sock, AFO, Tying shoes    Consulted and Agree with Plan of Care Patient             Patient will benefit from skilled therapeutic intervention in order to improve the following deficits and impairments:   Body Structure / Function / Physical Skills: ADL, Strength, Balance, GMC, Pain, Tone, Body mechanics, Edema, Proprioception, UE functional use, Endurance, IADL, ROM, Coordination, Flexibility, Mobility, Sensation, Decreased knowledge of precautions, 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)  Chronic left shoulder pain  Stiffness of left shoulder, not elsewhere classified  Unsteadiness on feet  Muscle weakness (generalized)  Abnormal posture  Other disturbances of skin sensation    Problem List Patient Active Problem List   Diagnosis Date Noted   COVID-19 virus infection 04/21/2021    Left-sided weakness 04/21/2021   S/P craniotomy 06/05/2020   History of cranioplasty 06/05/2020   Peri-rectal abscess 02/14/2020   Abnormal CT scan, pelvis 02/14/2020   Pancytopenia ([place]) 12/02/2019   Nephrolithiasis 12/02/2019   Hydronephrosis with renal and ureteral calculus obstruction 12/02/2019   Rectal pain 12/02/2019   Hyperkalemia 12/02/2019   Reactive depression    Wound infection after surgery    Sleep disturbance    Dysphagia, post-stroke    Transaminitis    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   Cerebral abscess    Urinary tract infection without hematuria    Altered mental status    Primary hypercoagulable state ([place])    Acute pulmonary embolism without acute cor pulmonale (HCC)    Deep vein thrombosis (DVT) of non-extremity vein    Hypokalemia    Acute blood  loss anemia    Leukocytosis    Endotracheal tube present    Acute respiratory failure with hypoxemia (HCC)    Stroke (cerebrum) ([place]) 07/19/2019   Pressure injury of skin 07/19/2019   Acute CVA (cerebrovascular accident) [facility])    Encephalopathy    Dysphagia    Acute encephalopathy    Essential hypertension    Obesity 03/12/2016   Scalp abscess 12/20/2015   Neck abscess 12/20/2015   Pilonidal cyst 02/08/2013    Mariah Milling, OTR/L 06/26/2021, 5:34 PM  [facility] [address] [place], [place], [address] Phone: [phone]   Fax:  [phone]  Name: ZARREN PENFOLD MRN: [id number] Date of Birth: Aug 01, 1976

## 2021-06-26 NOTE — Telephone Encounter (Signed)
Matthew Black called to request a referral for CAP services to get a CNA to come out and help with personal care needs.

## 2021-06-26 NOTE — Telephone Encounter (Signed)
Pt called in states that he needs a referral for Home Aide to come in house. Pt states that he family can not help as well as he needs.

## 2021-06-27 NOTE — Telephone Encounter (Signed)
[facility] called states that they are unable to take due to him not being homebound and needing skilled nursing.

## 2021-06-27 NOTE — Telephone Encounter (Addendum)
Mr. Fougerousse called and stated the phone number given to him was not helpful. Patient was advised to call the number on his Medicaid card. Or his PCP Mackie Pai PA.

## 2021-06-27 NOTE — Telephone Encounter (Signed)
Give pt [phone]

## 2021-06-28 NOTE — Telephone Encounter (Addendum)
Notified that Dr Ranell Patrick has had the social workers place him on the CAP program list, but there is a long wait list.

## 2021-07-02 ENCOUNTER — Telehealth: Payer: Self-pay | Admitting: Medical

## 2021-07-02 NOTE — Telephone Encounter (Signed)
Called pt made him aware that [facility] are agency that would be able to help him out but they are out of pocket. Also that he may want to call insurance and see if they could help him out.

## 2021-07-02 NOTE — Telephone Encounter (Signed)
Opened to review 

## 2021-07-03 NOTE — Telephone Encounter (Signed)
Spoke with Allen and we have tried getting him in with several home health places. Some were under staffed or did not accept insurance, which is medicaid.  One of the home health places stated that looking at his medicaid they will only cover 3 visits which is possibly not helpful at all.  I believe we should put in a social worker referral.    "[Just wanted to clarify for you all that we have a Social Worker (Thea Silversmith) that sees pts remotely. If you need her re: a patient you can Teams her or her contact info is in the patients care team"

## 2021-07-04 ENCOUNTER — Encounter: Payer: Self-pay | Admitting: Occupational Therapy

## 2021-07-04 ENCOUNTER — Other Ambulatory Visit: Payer: Self-pay

## 2021-07-04 ENCOUNTER — Ambulatory Visit: Payer: Medicaid Other | Admitting: Occupational Therapy

## 2021-07-04 ENCOUNTER — Ambulatory Visit: Payer: Medicaid Other

## 2021-07-04 DIAGNOSIS — R2689 Other abnormalities of gait and mobility: Secondary | ICD-10-CM

## 2021-07-04 DIAGNOSIS — R2681 Unsteadiness on feet: Secondary | ICD-10-CM | POA: Diagnosis not present

## 2021-07-04 DIAGNOSIS — M6281 Muscle weakness (generalized): Secondary | ICD-10-CM

## 2021-07-04 DIAGNOSIS — I69354 Hemiplegia and hemiparesis following cerebral infarction affecting left non-dominant side: Secondary | ICD-10-CM | POA: Diagnosis not present

## 2021-07-04 DIAGNOSIS — R41840 Attention and concentration deficit: Secondary | ICD-10-CM | POA: Diagnosis not present

## 2021-07-04 DIAGNOSIS — M25612 Stiffness of left shoulder, not elsewhere classified: Secondary | ICD-10-CM

## 2021-07-04 DIAGNOSIS — R293 Abnormal posture: Secondary | ICD-10-CM | POA: Diagnosis not present

## 2021-07-04 DIAGNOSIS — M25512 Pain in left shoulder: Secondary | ICD-10-CM | POA: Diagnosis not present

## 2021-07-04 DIAGNOSIS — R208 Other disturbances of skin sensation: Secondary | ICD-10-CM | POA: Diagnosis not present

## 2021-07-04 DIAGNOSIS — G8929 Other chronic pain: Secondary | ICD-10-CM | POA: Diagnosis not present

## 2021-07-04 NOTE — Telephone Encounter (Signed)
Tried calling patient to let him know we are putting in referral for social worker to help him out with resources since we are unable to get him home health.  He also needs to follow up with Matthew Black.  No answer and vm full.

## 2021-07-04 NOTE — Patient Instructions (Signed)
  Aquatic Therapy: What to Expect!  Where:  [facility] [address] [place],   [address] [phone]  NOTE:  You will receive an automated phone message reminding you of your appointment and it will say the appointment is at the [facility] on [address]  We are working to fix this- just know that you will meet us at the pool!  How to Prepare: Please make sure you drink 8 ounces of water about one hour prior to your pool session A caregiver MUST attend the entire session with the patient.  The caregiver will be responsible for assisting with dressing as well as any toileting needs.  If the patient will be doing a home program this should likely be the person who will assist as well.  Patients must wear either their street shoes or pool shoes until they are ready to enter the pool with the therapist.  Patients must also wear either street shoes or pool shoes once exiting the pool to walk to the locker room.  This will helps us prevent slips and falls.  Please arrive 15 minutes early to prepare for your pool therapy session Sign in at the front desk on the clipboard marked for [place] You may use the locker rooms on your right and then enter directly into the recreation pool (NOT the competition pool) Please make sure to attend to any toileting needs prior to entering the pool Please be dressed in your swim suit and on the pool deck at least 5 minutes before your appointment Once on the pool deck your therapist will ask you to sign the Patient  Consent and Assignment of Benefits form Your therapist may take your blood pressure prior to, during and after your session if indicated  About the pool  and parking: Entering the pool Your therapist will assist you; there are 2 ways to enter:  stairs with railings or with a chair lift.   Your therapist will determine the most appropriate way for you. Water temperature is usually between 86-87 degrees There  may be other swimmers in the pool at the same time Parking is free.   Contact Info:     Appointments: [facility]  All sessions are 45 minutes   [address]  [address]     Please call the [facility] if   [place],    [phone]    you need to cancel or reschedule an appointment.  [phone]       

## 2021-07-04 NOTE — Patient Instructions (Signed)
Exercises Supine Bridge - 1 x daily - 6 x weekly - 5 reps - 3 sets - 2 hold Bent Knee Fallouts - 1 x daily - 6 x weekly - 5 reps - 3 sets Supine Heel Slide with Strap - 1 x daily - 6 x weekly - 5 reps - 2 sets Supine Knee Extension Strengthening - 1 x daily - 6 x weekly - 5 reps - 3 sets Supine Bilateral Hip Internal Rotation Stretch - 3 x daily - 7 x weekly - 1 sets - 3 reps - 30 sec hold Supine Hip Flexor Stretch with Caregiver - 3 x daily - 7 x weekly - 1 sets - 3 reps - 30 sec hold Side to Side Weight Shift with Counter Support - 1 x daily - 7 x weekly - 3 sets - 10 reps

## 2021-07-04 NOTE — Therapy (Signed)
[facility] [address] [place], [place], [address] Phone: [phone]   Fax:  [phone]  Physical Therapy Treatment  Patient Details  Name: Matthew Black MRN: [id number] Date of Birth: 18-Jul-1976 Referring Provider (PT): Frann Rider, NP   Encounter Date: 07/04/2021   PT End of Session - 07/04/21 1459     Visit Number 2    Number of Visits 7    Date for PT Re-Evaluation 08/07/21    Authorization Type Medicaid [employer]; has used 14 visits so far of PT/OT in 2022. 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   Depression    DVT (deep venous thrombosis) ([place])    BLE DVT 07/21/19, 08/02/19; s/p retrievable IVC filter 07/21/19   Hemorrhagic stroke ([place])    History of kidney stones    Hypertension    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   PE (pulmonary thromboembolism) ([place])    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   Stroke ([place])    RICA, R A1, R MCA occlusion 07/19/19    Past Surgical History:  Procedure Laterality Date   CRANIOPLASTY Right 06/05/2020   Procedure: CRANIOPLASTY;  Surgeon: Consuella Lose, MD;  Location: [facility];  Service: Neurosurgery;  Laterality: Right;  right   CRANIOTOMY Right 07/19/2019   Procedure: RIGHT HEMI-CRANIECTOMY With implantation of skull flap to abdominal wall;  Surgeon: Consuella Lose, MD;  Location: [facility];  Service: Neurosurgery;  Laterality: Right;   CYST EXCISION N/A 10/08/2016   Procedure:  EXCISION OF POSTERIOR NECK CYST;  Surgeon: Clovis Riley, MD;  [facility];  Service: General;  Laterality: N/A;   CYSTOSCOPY/URETEROSCOPY/HOLMIUM LASER/STENT PLACEMENT Right 03/16/2020   Procedure: CYSTOSCOPY RIGHT RETROGRADE PYELOGRAM URETEROSCOPY/HOLMIUM LASER/STENT PLACEMENT;  Surgeon: Lucas Mallow, MD;  [facility];  Service: Urology;  Laterality: Right;   INCISION AND DRAINAGE ABSCESS N/A 09/22/2014   Procedure: INCISION AND DRAINAGE ABSCESS POSTERIOR NECK;  Surgeon: Pedro Earls, MD;  [facility];  Service: General;  Laterality: N/A;   INCISION AND DRAINAGE ABSCESS N/A 12/20/2015   Procedure: INCISION AND DRAINAGE POSTERIOR NECK MASS;  Surgeon: Armandina Gemma, MD;  [facility];  Service: General;  Laterality: N/A;   INCISION AND DRAINAGE ABSCESS Left 07/10/2004   middle finger   IR IVC FILTER PLMT / S&I /IMG GUID/MOD SED  07/21/2019   IR RADIOLOGIST EVAL & MGMT  12/14/2019   IR RADIOLOGIST EVAL & MGMT  12/21/2020   IR VENOGRAM RENAL UNI RIGHT  07/21/2019   MASS EXCISION N/A 07/21/2017   Procedure: EXCISION OF BENIGN NECK LESION WITH LAYERED CLOSURE;  Surgeon: Irene Limbo, MD;  Location: [facility];  Service: Plastics;  Laterality: N/A;   MASS EXCISION N/A 11/10/2017   Procedure: EXCISION BENIGN LESION OF THE NECK WITH LAYERED CLOSURE;  Surgeon: Irene Limbo, MD;  Location: [facility];  Service: Plastics;  Laterality: N/A;    There were no vitals filed for this visit.   Subjective Assessment - 07/04/21 1459     Subjective Pt reports that he is doing ok. Has been walking at home with quad cane and at park some too. Pt reports that he has done stairs since the fall. Pt feels that his quality of life is so much better since doing therapy and is now driving. Voices that he is very grateful.    Pertinent History CVA (previous R MCA CVA), hypercoagulable state, history of PEs and DVT, hypertension    Limitations Walking;House  hold activities;Standing    How long can you walk comfortably? Around the house only    Patient Stated Goals Increase walking; improve confidence with ramps    Currently in Pain? No/denies                               [facility] Adult PT Treatment/Exercise - 07/04/21 1501       Transfers   Transfers Sit to Stand;Stand to Sit;Stand Pivot Transfers    Sit to Stand 5: Supervision    Stand to Sit 5: Supervision    Stand Pivot Transfers 5: Supervision    Stand Pivot Transfer Details (indicate cue type and reason) with large based quad cane      Ambulation/Gait   Ambulation/Gait Yes    Ambulation/Gait Assistance 5: Supervision;4: Min guard    Ambulation Distance (Feet) 140 Feet    Assistive device Large base quad cane   left AFO   Gait Pattern Step-to pattern;Decreased stance time - left;Decreased hip/knee flexion - left;Decreased weight shift to left    Ambulation Surface Level;Indoor      Exercises   Exercises Other Exercises    Other Exercises  PT reviewed prior HEP as noted below.            Exercises-reviewed  below Supine Bridge - 1 x daily - 6 x weekly - 5 reps - 3 sets - 2 hold  -performed x 5 with PT assisting to get left leg in position and maintaining hip in neutral. Pt reported this one had been hard at home but reminded him he needed assistance to perform correctly. Bent Knee Fallouts - 1 x daily - 6 x weekly - 5 reps - 3 sets  -performed seated hip abd/add x 5 with 5 sec holds with minimal activation on left. Supine Heel Slide with Strap - 1 x daily - 6 x weekly - 5 reps - 2 sets  -performed with min assist x 10 Supine Knee Extension Strengthening - 1 x daily - 6 x weekly - 5 reps - 3 sets Supine Bilateral Hip Internal Rotation Stretch - 3 x daily - 7 x weekly - 1 sets - 3 reps - 30 sec hold  -performed 30 sec x 2 on left Supine Hip Flexor Stretch with Caregiver - 3 x daily - 7 x weekly - 1 sets - 3 reps - 30 sec hold  -discussed still performing  in supine with caregiver pushing down on thigh Side to Side Weight Shift with Counter Support - 1 x daily - 7 x weekly - 3 sets - 10 reps  -discussed performance at counter        PT Education - 07/04/21 1651     Education Details Reviewed HEP and advised to have family help with certain activities.  Person(s) Educated Patient    Methods Explanation;Demonstration    Comprehension Verbalized understanding;Returned demonstration              PT Short Term Goals - 06/26/21 1637       PT SHORT TERM GOAL #1   Title STG=LTGs               PT Long Term Goals - 06/26/21 1633       PT LONG TERM GOAL #1   Title Pt will increase gait speed from 0.137ms to >0.2107m for improved gait safety.    Baseline 0.197 m/s    Time 6    Period Weeks    Status New    Target Date 08/07/21      PT LONG TERM GOAL #2   Title Pt will be able to negotiate incline/decline for at least 10' with quad cane and SBA for improved ability to use church ramp/attain pt's goal to walk up/down his driveway    Baseline Min A on small decline during descent    Time 6    Period Weeks    Status New    Target Date 08/07/21      PT LONG TERM GOAL #3   Title Pt will ambulate >250' with LRAD CGA for improved limited community amb.    Baseline 161' with large based quad cane CGA    Time 6    Period Weeks    Status New    Target Date 08/07/21      PT LONG TERM GOAL #4   Title Pt will have decreased TUG to </=30 sec to demo improved gait    Baseline 38 sec    Time 6    Period Weeks    Status New    Target Date 08/07/21                   Plan - 07/04/21 1654     Clinical Impression Statement Pt needs some assistance on prior HEP still. He reported that those were the ones he had difficulty with. Advised to have family assist him. Pt was able to increase left stance time but still weight shifted mostly to right with gait.    Personal Factors and Comorbidities Age;Time since onset of  injury/illness/exacerbation;Comorbidity 1;Comorbidity 3+;Comorbidity 2    Comorbidities CVA (previous R MCA CVA), hypercoagulable state, history of PEs and DVT, hypertension    Examination-Activity Limitations Locomotion Level;Stairs    Examination-Participation Restrictions Church;Community Activity;Shop    Stability/Clinical Decision Making Evolving/Moderate complexity    Rehab Potential Good    PT Frequency 1x / week    PT Duration 6 weeks    PT Treatment/Interventions Aquatic Therapy;ADLs/Self Care Home Management;DME Instruction;Gait training;Stair training;Functional mobility training;Therapeutic activities;Therapeutic exercise;Balance training;Neuromuscular re-education;Orthotic Fit/Training;Patient/family education;Manual techniques;Dry needling;Energy conservation;Taping    PT Next Visit Plan Work on pt's endurance with walking (try interval training). Work on standing balance on ramp. Continue to work on increasing L LE weightbearing. Can we add exercises for HEP that pt can do more on own.    Consulted and Agree with Plan of Care Patient             Patient will benefit from skilled therapeutic intervention in order to improve the following deficits and impairments:  Abnormal gait, Impaired tone, Decreased endurance, Decreased activity tolerance, Decreased balance, Decreased mobility, Decreased strength, Impaired sensation  Visit Diagnosis: Other abnormalities of gait and mobility  Muscle weakness (generalized)     Problem List Patient  Active Problem List   Diagnosis Date Noted   COVID-19 virus infection 04/21/2021   Left-sided weakness 04/21/2021   S/P craniotomy 06/05/2020   History of cranioplasty 06/05/2020   Peri-rectal abscess 02/14/2020   Abnormal CT scan, pelvis 02/14/2020   Pancytopenia ([place]) 12/02/2019   Nephrolithiasis 12/02/2019   Hydronephrosis with renal and ureteral calculus obstruction 12/02/2019   Rectal pain 12/02/2019   Hyperkalemia 12/02/2019    Reactive depression    Wound infection after surgery    Sleep disturbance    Dysphagia, post-stroke    Transaminitis    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   Cerebral abscess    Urinary tract infection without hematuria    Altered mental status    Primary hypercoagulable state ([place])    Acute pulmonary embolism without acute cor pulmonale ([facility])    Deep vein thrombosis (DVT) of non-extremity vein    Hypokalemia    Acute blood loss anemia    Leukocytosis    Endotracheal tube present    Acute respiratory failure with hypoxemia ([facility])    Stroke (cerebrum) ([place]) 07/19/2019   Pressure injury of skin 07/19/2019   Acute CVA (cerebrovascular accident) ([facility])    Encephalopathy    Dysphagia    Acute encephalopathy    Essential hypertension    Obesity 03/12/2016   Scalp abscess 12/20/2015   Neck abscess 12/20/2015   Pilonidal cyst 02/08/2013    Electa Sniff, PT, DPT, NCS 07/04/2021, 5:06 PM  [facility] [address] [place], [place], [address] Phone: [phone]   Fax:  [phone]  Name: Matthew Black MRN: [id number] Date of Birth: Aug 23, 1976

## 2021-07-04 NOTE — Therapy (Signed)
[facility] [address] [place], [place], [address] Phone: [phone]   Fax:  [phone]  Occupational Therapy Treatment  Patient Details  Name: Matthew Black MRN: [id number] Date of Birth: 09-Feb-1976 Referring Provider (OT): Frann Rider   Encounter Date: 07/04/2021   OT End of Session - 07/04/21 1531     Visit Number 2    Number of Visits 7    Date for OT Re-Evaluation 09/09/21    Authorization Type [employer] Medicaid    Authorization - Visit Number 1    Progress Note Due on Visit 10    OT Start Time 1530    OT Stop Time 1615    OT Time Calculation (min) 45 min    Activity Tolerance Patient tolerated treatment well    Behavior During Therapy [facility] for tasks assessed/performed             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   Depression    DVT (deep venous thrombosis) ([place])    BLE DVT 07/21/19, 08/02/19; s/p retrievable IVC filter 07/21/19   Hemorrhagic stroke ([place])    History of kidney stones    Hypertension    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   PE (pulmonary thromboembolism) ([place])    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   Stroke ([place])    RICA, R A1, R MCA occlusion 07/19/19    Past Surgical History:  Procedure Laterality Date   CRANIOPLASTY Right 06/05/2020   Procedure: CRANIOPLASTY;  Surgeon: Consuella Lose, MD;  Location: [facility];  Service: Neurosurgery;  Laterality: Right;  right   CRANIOTOMY Right 07/19/2019   Procedure: RIGHT HEMI-CRANIECTOMY With implantation of skull flap to abdominal wall;  Surgeon: Consuella Lose, MD;  Location: [facility];  Service: Neurosurgery;  Laterality: Right;   CYST EXCISION N/A 10/08/2016   Procedure: EXCISION OF POSTERIOR NECK CYST;  Surgeon: Clovis Riley, MD;  [facility];  Service: General;  Laterality: N/A;   CYSTOSCOPY/URETEROSCOPY/HOLMIUM LASER/STENT PLACEMENT Right 03/16/2020    Procedure: CYSTOSCOPY RIGHT RETROGRADE PYELOGRAM URETEROSCOPY/HOLMIUM LASER/STENT PLACEMENT;  Surgeon: Lucas Mallow, MD;  [facility];  Service: Urology;  Laterality: Right;   INCISION AND DRAINAGE ABSCESS N/A 09/22/2014   Procedure: INCISION AND DRAINAGE ABSCESS POSTERIOR NECK;  Surgeon: Pedro Earls, MD;  [facility];  Service: General;  Laterality: N/A;   INCISION AND DRAINAGE ABSCESS N/A 12/20/2015   Procedure: INCISION AND DRAINAGE POSTERIOR NECK MASS;  Surgeon: Armandina Gemma, MD;  [facility];  Service: General;  Laterality: N/A;   INCISION AND DRAINAGE ABSCESS Left 07/10/2004   middle finger   IR IVC FILTER PLMT / S&I /IMG GUID/MOD SED  07/21/2019   IR RADIOLOGIST EVAL & MGMT  12/14/2019   IR RADIOLOGIST EVAL & MGMT  12/21/2020   IR VENOGRAM RENAL UNI RIGHT  07/21/2019   MASS EXCISION N/A 07/21/2017   Procedure: EXCISION OF BENIGN NECK LESION WITH LAYERED CLOSURE;  Surgeon: Irene Limbo, MD;  Location: [facility];  Service: Plastics;  Laterality: N/A;   MASS EXCISION N/A 11/10/2017   Procedure: EXCISION BENIGN LESION OF THE NECK WITH LAYERED CLOSURE;  Surgeon: Irene Limbo, MD;  Location: [facility];  Service: Plastics;  Laterality: N/A;    There were no vitals filed for this visit.   Subjective Assessment - 07/04/21 1531     Subjective  Hanging in there  Currently in Pain? No/denies                          OT Treatments/Exercises (OP) - 07/04/21 1628       ADLs   Overall ADLs reviewed aquatics information for patient starting aquatics on Monday with OT. Pt verbalize understanding    LB Dressing pt was able to doff LLE shoe, sock and AFO. Pt required assistance with initiating donning sock on LLE but was able to pull over heel and up the rest of the way. Pt donned AFO with supervision and req'd total assitsance for donning shoe                    OT Education - 07/04/21 1633     Education  Details Reviewed Aquatics information    Person(s) Educated Patient    Methods Explanation    Comprehension Verbalized understanding              OT Short Term Goals - 07/04/21 1551       OT SHORT TERM GOAL #1   Title Patient will complete an updated HEP to ensure management of RUE    Baseline Has prior program, now with fall / new pain - need to establish new program    Time 4    Period Weeks    Status On-going    Target Date 08/10/21      OT SHORT TERM GOAL #2   Title Patient will don sock on left foot with mod assist    Baseline dependent    Time 4    Period Weeks    Status On-going      OT SHORT TERM GOAL #3   Title Patient will understand positioning for left shoulder for pain relief in bed or wheelchair    Baseline needs occasional assistance to reposition in bed    Time 4    Period Weeks      OT SHORT TERM GOAL #4   Title Patient will demonstrate 90 degrees of sholder flexion passively without increase in pain    Baseline 70 degrees    Time 4    Period Weeks    Status New               OT Long Term Goals - 06/26/21 1733       OT LONG TERM GOAL #1   Title Patient will complete an aquatic exercise program he can complete at his gym with support of family member    Baseline not yet established    Time 6    Period Weeks    Status New      OT LONG TERM GOAL #2   Title Patient will don AFO onto RLE with min assist    Baseline dependent    Time 6    Period Weeks    Status New      OT LONG TERM GOAL #3   Title Patient will don left shoe with mod assistance    Baseline dependent    Time 6    Period Weeks    Status New      OT LONG TERM GOAL #4   Title Patient will demonstrate awareness of compensatory strategies or adaptive equipment to increase independence and safety with cutting and peeling foods    Baseline needs assistance    Time 6    Period Weeks    Status New  OT LONG TERM GOAL #5   Title xxx                     Patient will benefit from skilled therapeutic intervention in order to improve the following deficits and impairments:           Visit Diagnosis: Spastic hemiplegia of left nondominant side as late effect of cerebral infarction [facility])  Chronic left shoulder pain  Stiffness of left shoulder, not elsewhere classified  Muscle weakness (generalized)  Unsteadiness on feet  Other abnormalities of gait and mobility  Attention and concentration deficit  Hemiplegia and hemiparesis following cerebral infarction affecting left non-dominant side [facility])    Problem List Patient Active Problem List   Diagnosis Date Noted   COVID-19 virus infection 04/21/2021   Left-sided weakness 04/21/2021   S/P craniotomy 06/05/2020   History of cranioplasty 06/05/2020   Peri-rectal abscess 02/14/2020   Abnormal CT scan, pelvis 02/14/2020   Pancytopenia ([place]) 12/02/2019   Nephrolithiasis 12/02/2019   Hydronephrosis with renal and ureteral calculus obstruction 12/02/2019   Rectal pain 12/02/2019   Hyperkalemia 12/02/2019   Reactive depression    Wound infection after surgery    Sleep disturbance    Dysphagia, post-stroke    Transaminitis    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   Cerebral abscess    Urinary tract infection without hematuria    Altered mental status    Primary hypercoagulable state ([place])    Acute pulmonary embolism without acute cor pulmonale ([place])    Deep vein thrombosis (DVT) of non-extremity vein    Hypokalemia    Acute blood loss anemia    Leukocytosis    Endotracheal tube present    Acute respiratory failure with hypoxemia ([facility])    Stroke (cerebrum) ([place]) 07/19/2019   Pressure injury of skin 07/19/2019   Acute CVA (cerebrovascular accident) ([place])    Encephalopathy    Dysphagia    Acute encephalopathy    Essential hypertension    Obesity 03/12/2016   Scalp abscess 12/20/2015   Neck abscess 12/20/2015   Pilonidal cyst 02/08/2013     Zachery Conch MOT, OTR/L  07/04/2021, 4:39 PM  [facility] [address] [place], [place], [address] Phone: [phone]   Fax:  [phone]  Name: Matthew Black MRN: [id number] Date of Birth: 06-Jun-1976

## 2021-07-05 NOTE — Telephone Encounter (Signed)
No answer and mailbox full

## 2021-07-08 ENCOUNTER — Encounter: Payer: Self-pay | Admitting: Occupational Therapy

## 2021-07-08 ENCOUNTER — Ambulatory Visit: Payer: Self-pay | Admitting: Occupational Therapy

## 2021-07-08 DIAGNOSIS — R293 Abnormal posture: Secondary | ICD-10-CM | POA: Diagnosis not present

## 2021-07-08 DIAGNOSIS — I69354 Hemiplegia and hemiparesis following cerebral infarction affecting left non-dominant side: Secondary | ICD-10-CM | POA: Diagnosis not present

## 2021-07-08 DIAGNOSIS — R41840 Attention and concentration deficit: Secondary | ICD-10-CM | POA: Diagnosis not present

## 2021-07-08 DIAGNOSIS — M25512 Pain in left shoulder: Secondary | ICD-10-CM | POA: Diagnosis not present

## 2021-07-08 DIAGNOSIS — M6281 Muscle weakness (generalized): Secondary | ICD-10-CM | POA: Diagnosis not present

## 2021-07-08 DIAGNOSIS — M25612 Stiffness of left shoulder, not elsewhere classified: Secondary | ICD-10-CM | POA: Diagnosis not present

## 2021-07-08 DIAGNOSIS — R2681 Unsteadiness on feet: Secondary | ICD-10-CM | POA: Diagnosis not present

## 2021-07-08 DIAGNOSIS — G8929 Other chronic pain: Secondary | ICD-10-CM | POA: Diagnosis not present

## 2021-07-08 DIAGNOSIS — R208 Other disturbances of skin sensation: Secondary | ICD-10-CM | POA: Diagnosis not present

## 2021-07-08 DIAGNOSIS — R2689 Other abnormalities of gait and mobility: Secondary | ICD-10-CM | POA: Diagnosis not present

## 2021-07-08 NOTE — Telephone Encounter (Signed)
Unable to reach letter has been sent.

## 2021-07-08 NOTE — Therapy (Signed)
[facility] [address] [place], [place], [address] Phone: [phone]   Fax:  [phone]  Occupational Therapy Treatment  Patient Details  Name: Matthew Black MRN: [id number] Date of Birth: Feb 24, 1976 Referring Provider (OT): Frann Rider   Encounter Date: 07/08/2021   OT End of Session - 07/08/21 1923     Visit Number 3    Number of Visits 7    Date for OT Re-Evaluation 09/09/21    Authorization Type [employer]    Authorization - Visit Number 2    Progress Note Due on Visit 10    OT Start Time 1500    OT Stop Time 1545    OT Time Calculation (min) 45 min    Activity Tolerance Patient tolerated treatment well    Behavior During Therapy [facility] for tasks assessed/performed             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   Depression    DVT (deep venous thrombosis) ([place])    BLE DVT 07/21/19, 08/02/19; s/p retrievable IVC filter 07/21/19   Hemorrhagic stroke ([employer])    History of kidney stones    Hypertension    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   PE (pulmonary thromboembolism) ([place])    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   Stroke ([place])    RICA, R A1, R MCA occlusion 07/19/19    Past Surgical History:  Procedure Laterality Date   CRANIOPLASTY Right 06/05/2020   Procedure: CRANIOPLASTY;  Surgeon: Consuella Lose, MD;  Location: [facility];  Service: Neurosurgery;  Laterality: Right;  right   CRANIOTOMY Right 07/19/2019   Procedure: RIGHT HEMI-CRANIECTOMY With implantation of skull flap to abdominal wall;  Surgeon: Consuella Lose, MD;  Location: [facility];  Service: Neurosurgery;  Laterality: Right;   CYST EXCISION N/A 10/08/2016   Procedure: EXCISION OF POSTERIOR NECK CYST;  Surgeon: Clovis Riley, MD;  [facility];  Service: General;  Laterality: N/A;   CYSTOSCOPY/URETEROSCOPY/HOLMIUM LASER/STENT PLACEMENT Right 03/16/2020    Procedure: CYSTOSCOPY RIGHT RETROGRADE PYELOGRAM URETEROSCOPY/HOLMIUM LASER/STENT PLACEMENT;  Surgeon: Lucas Mallow, MD;  [facility];  Service: Urology;  Laterality: Right;   INCISION AND DRAINAGE ABSCESS N/A 09/22/2014   Procedure: INCISION AND DRAINAGE ABSCESS POSTERIOR NECK;  Surgeon: Pedro Earls, MD;  [facility];  Service: General;  Laterality: N/A;   INCISION AND DRAINAGE ABSCESS N/A 12/20/2015   Procedure: INCISION AND DRAINAGE POSTERIOR NECK MASS;  Surgeon: Armandina Gemma, MD;  [facility];  Service: General;  Laterality: N/A;   INCISION AND DRAINAGE ABSCESS Left 07/10/2004   middle finger   IR IVC FILTER PLMT / S&I /IMG GUID/MOD SED  07/21/2019   IR RADIOLOGIST EVAL & MGMT  12/14/2019   IR RADIOLOGIST EVAL & MGMT  12/21/2020   IR VENOGRAM RENAL UNI RIGHT  07/21/2019   MASS EXCISION N/A 07/21/2017   Procedure: EXCISION OF BENIGN NECK LESION WITH LAYERED CLOSURE;  Surgeon: Irene Limbo, MD;  Location: [facility];  Service: Plastics;  Laterality: N/A;   MASS EXCISION N/A 11/10/2017   Procedure: EXCISION BENIGN LESION OF THE NECK WITH LAYERED CLOSURE;  Surgeon: Irene Limbo, MD;  Location: [facility];  Service: Plastics;  Laterality: N/A;    There were no vitals filed for this visit.   Subjective Assessment - 07/08/21 1922     Subjective  Patient excited to try  aquatic therapy.  "I have not walked without my brace in 2 years"    Currently in Pain? No/denies             Patient seen for his first aquatic therapy visit.  Patient entered and exited pool via stairs max assist for right foot placement and loading.   Session occurred in 3.5-4.5 ft of water.   Patient with very poor alignment of right foot, and had difficulty maintaining foot on the ground.  Walked on pool deck to pool's edge to remove AFO and shoes.  Patient reports he has not walked without AFO for 2 years.   Patient initially anxious regarding walking in pool  but with facilitation and support walked (mostly step to pattern) with mod assist.  With repetition and assist for right weight shift, able to walk with step through pattern.  Working to decrease reliance on UE's for walking.  Placed aircast on right ankle to help provide better alignment for base of support.   In supine worked to relax and allow body on arm movement to reduce tension in RUE.  Patient demo'd slight active movement into abduction/adduction and rotation at [facility] joint following dynamic stretching.                          OT Short Term Goals - 07/08/21 1926       OT SHORT TERM GOAL #1   Title Patient will complete an updated HEP to ensure management of RUE    Baseline Has prior program, now with fall / new pain - need to establish new program    Time 4    Period Weeks    Status On-going    Target Date 08/10/21      OT SHORT TERM GOAL #2   Title Patient will don sock on left foot with mod assist    Baseline dependent    Time 4    Period Weeks    Status On-going      OT SHORT TERM GOAL #3   Title Patient will understand positioning for left shoulder for pain relief in bed or wheelchair    Baseline needs occasional assistance to reposition in bed    Time 4    Period Weeks      OT SHORT TERM GOAL #4   Title Patient will demonstrate 90 degrees of sholder flexion passively without increase in pain    Baseline 70 degrees    Time 4    Period Weeks    Status New               OT Long Term Goals - 07/08/21 1926       OT LONG TERM GOAL #1   Title Patient will complete an aquatic exercise program he can complete at his gym with support of family member    Baseline not yet established    Time 6    Period Weeks    Status On-going      OT LONG TERM GOAL #2   Title Patient will don AFO onto RLE with min assist    Baseline dependent    Time 6    Period Weeks    Status On-going      OT LONG TERM GOAL #3   Title Patient will don left shoe with  mod assistance    Baseline dependent    Time 6    Period Weeks    Status On-going  OT LONG TERM GOAL #4   Title Patient will demonstrate awareness of compensatory strategies or adaptive equipment to increase independence and safety with cutting and peeling foods    Baseline needs assistance    Time 6    Period Weeks    Status On-going      OT LONG TERM GOAL #5   Title xxx                   Plan - 07/08/21 1924     Clinical Impression Statement Pt responded well to aquatic environment, and displays decreased tension in RUE at end of session.    OT Occupational Profile and History Detailed Assessment- Review of Records and additional review of physical, cognitive, psychosocial history related to current functional performance    Occupational performance deficits (Please refer to evaluation for details): ADL's;IADL's;Work    Body Structure / Function / Physical Skills ADL;Strength;Balance;GMC;Pain;Tone;Body mechanics;Edema;Proprioception;UE functional use;Endurance;IADL;ROM;Coordination;Flexibility;Mobility;Sensation;Decreased knowledge of precautions;FMC    Rehab Potential Good    Clinical Decision Making Several treatment options, min-mod task modification necessary    Comorbidities Affecting Occupational Performance: May have comorbidities impacting occupational performance    Modification or Assistance to Complete Evaluation  No modification of tasks or assist necessary to complete eval    OT Frequency 1x / week    OT Duration 6 weeks    OT Treatment/Interventions Self-care/ADL training;Aquatic Therapy;Moist Heat;DME and/or AE instruction;Splinting;Balance training;Therapeutic activities;Therapeutic exercise;Neuromuscular education;Functional Mobility Training;Patient/family education;Manual Therapy;Electrical Stimulation    Plan Discuss aquatic therapy - needs exercise program, resting hand splint, review current stretching program - update as indicated, ADL -  Compensations for sock, AFO, Tying shoes    Consulted and Agree with Plan of Care Patient             Patient will benefit from skilled therapeutic intervention in order to improve the following deficits and impairments:   Body Structure / Function / Physical Skills: ADL, Strength, Balance, GMC, Pain, Tone, Body mechanics, Edema, Proprioception, UE functional use, Endurance, IADL, ROM, Coordination, Flexibility, Mobility, Sensation, Decreased knowledge of precautions, [facility]       Visit Diagnosis: 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)  Chronic left shoulder pain  Stiffness of left shoulder, not elsewhere classified  Muscle weakness (generalized)  Unsteadiness on feet    Problem List Patient Active Problem List   Diagnosis Date Noted   COVID-19 virus infection 04/21/2021   Left-sided weakness 04/21/2021   S/P craniotomy 06/05/2020   History of cranioplasty 06/05/2020   Peri-rectal abscess 02/14/2020   Abnormal CT scan, pelvis 02/14/2020   Pancytopenia ([place]) 12/02/2019   Nephrolithiasis 12/02/2019   Hydronephrosis with renal and ureteral calculus obstruction 12/02/2019   Rectal pain 12/02/2019   Hyperkalemia 12/02/2019   Reactive depression    Wound infection after surgery    Sleep disturbance    Dysphagia, post-stroke    Transaminitis    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   Cerebral abscess    Urinary tract infection without hematuria    Altered mental status    Primary hypercoagulable state ([place])    Acute pulmonary embolism without acute cor pulmonale (HCC)    Deep vein thrombosis (DVT) of non-extremity vein    Hypokalemia    Acute blood loss anemia    Leukocytosis    Endotracheal tube present    Acute respiratory failure with hypoxemia ([place])    Stroke (cerebrum) ([place]) 07/19/2019   Pressure injury of skin 07/19/2019  Acute CVA (cerebrovascular accident) ([place])    Encephalopathy    Dysphagia     Acute encephalopathy    Essential hypertension    Obesity 03/12/2016   Scalp abscess 12/20/2015   Neck abscess 12/20/2015   Pilonidal cyst 02/08/2013    Mariah Milling 07/08/2021, 7:27 PM  [facility] [address] [place], [place], [address] Phone: [phone]   Fax:  [phone]  Name: AYREN STALNAKER MRN: [id number] Date of Birth: Jun 02, 1976

## 2021-07-10 ENCOUNTER — Other Ambulatory Visit: Payer: Self-pay

## 2021-07-10 ENCOUNTER — Ambulatory Visit: Payer: Medicaid Other | Admitting: Occupational Therapy

## 2021-07-10 ENCOUNTER — Ambulatory Visit: Payer: Medicaid Other

## 2021-07-11 ENCOUNTER — Ambulatory Visit
Admission: EM | Admit: 2021-07-11 | Discharge: 2021-07-11 | Disposition: A | Discharge status: Home / Self Care | Payer: Medicaid Other | Attending: Emergency Medicine | Admitting: Emergency Medicine

## 2021-07-11 ENCOUNTER — Other Ambulatory Visit: Payer: Self-pay

## 2021-07-11 DIAGNOSIS — L89152 Pressure ulcer of sacral region, stage 2: Secondary | ICD-10-CM

## 2021-07-11 MED ORDER — IBUPROFEN 800 MG PO TABS: 800.0000 mg | ORAL_TABLET | Freq: Three times a day (TID) | ORAL | 0 refills | Status: DC

## 2021-07-11 MED ORDER — CYCLOBENZAPRINE HCL 5 MG PO TABS: 5.0000 mg | ORAL_TABLET | Freq: Every day | ORAL | 0 refills | Status: DC

## 2021-07-11 MED ORDER — DOXYCYCLINE HYCLATE 100 MG PO CAPS: 100.0000 mg | ORAL_CAPSULE | Freq: Two times a day (BID) | ORAL | 0 refills | Status: DC

## 2021-07-11 NOTE — ED Triage Notes (Signed)
Pt c/o abscess or bed score to upper buttocks x1 month. States having drainage and pain.

## 2021-07-11 NOTE — Discharge Instructions (Addendum)
Take doxycycline twice a day for the next 7 days, this is an antibiotic to help clear any form of infection  Cleanse area every morning with normal saline, place small guaze wet with saline into wound bed without touching outer layer of skin, placed dry gauze over area then tape.  All supplies can be purchased at a local pharmacy such as [facility] and [facility]  Follow-up with primary care doctor in 2 weeks for reevaluation of area to make sure it is properly an evaluation for the need of further wound care  Can take ibuprofen every 8 hours with food for pain  Can use Flexeril every night before bed for additional comfort and to help you rest

## 2021-07-12 ENCOUNTER — Ambulatory Visit: Payer: Medicaid Other | Admitting: Family

## 2021-07-12 ENCOUNTER — Other Ambulatory Visit: Payer: Self-pay

## 2021-07-12 VITALS — Ht 71.0 in

## 2021-07-12 DIAGNOSIS — L8990 Pressure ulcer of unspecified site, unspecified stage: Secondary | ICD-10-CM

## 2021-07-12 NOTE — Progress Notes (Signed)
Patient had scheduled this appointment here yesterday and then opted to go ahead to [facility] due to pain; he was prescribed Doxycycline for wound and told he should see his PCP in 2 weeks for re-check; Will cancel for today and have him see his PCP in 2 weeks; patient also notes he wants to discuss home health options with his PCP.

## 2021-07-17 ENCOUNTER — Other Ambulatory Visit: Payer: Self-pay

## 2021-07-17 ENCOUNTER — Encounter: Payer: Self-pay | Admitting: Occupational Therapy

## 2021-07-17 ENCOUNTER — Ambulatory Visit: Payer: Medicaid Other

## 2021-07-17 ENCOUNTER — Ambulatory Visit: Payer: Medicaid Other | Admitting: Occupational Therapy

## 2021-07-17 DIAGNOSIS — R2681 Unsteadiness on feet: Secondary | ICD-10-CM

## 2021-07-17 DIAGNOSIS — I69354 Hemiplegia and hemiparesis following cerebral infarction affecting left non-dominant side: Secondary | ICD-10-CM

## 2021-07-17 DIAGNOSIS — R41840 Attention and concentration deficit: Secondary | ICD-10-CM | POA: Diagnosis not present

## 2021-07-17 DIAGNOSIS — M25612 Stiffness of left shoulder, not elsewhere classified: Secondary | ICD-10-CM | POA: Diagnosis not present

## 2021-07-17 DIAGNOSIS — R2689 Other abnormalities of gait and mobility: Secondary | ICD-10-CM

## 2021-07-17 DIAGNOSIS — M6281 Muscle weakness (generalized): Secondary | ICD-10-CM

## 2021-07-17 DIAGNOSIS — M25512 Pain in left shoulder: Secondary | ICD-10-CM | POA: Diagnosis not present

## 2021-07-17 DIAGNOSIS — G8929 Other chronic pain: Secondary | ICD-10-CM | POA: Diagnosis not present

## 2021-07-17 DIAGNOSIS — R293 Abnormal posture: Secondary | ICD-10-CM | POA: Diagnosis not present

## 2021-07-17 DIAGNOSIS — R208 Other disturbances of skin sensation: Secondary | ICD-10-CM | POA: Diagnosis not present

## 2021-07-17 NOTE — Therapy (Signed)
[facility] [address] [place], [place], [address] Phone: [phone]   Fax:  [phone]  Physical Therapy Treatment/Discharge visit  Patient Details  Name: Matthew Black MRN: [id number] Date of Birth: [birth year] Referring Provider (PT): Frann Rider, NP  PHYSICAL THERAPY DISCHARGE SUMMARY  Visits from Start of Care: 3  Current functional level related to goals / functional outcomes: Pt is currently ambulating short distances with quad cane and AFO. See clinical impression for more information. Discharging early due to pt being out of medicaid visits.   Remaining deficits: Balance deficits, left hemiparesis   Education / Equipment: HEP   Patient agrees to discharge. Patient goals were not met. Patient is being discharged due to financial reasons.  Encounter Date: 07/17/2021   PT End of Session - 07/17/21 1315     Visit Number 3    Number of Visits 7    Date for PT Re-Evaluation 08/07/21    Authorization Type Medicaid [employer]; has used 14 visits so far of PT/OT in 2022. 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   Depression    DVT (deep venous thrombosis) ([place])    BLE DVT 07/21/19, 08/02/19; s/p retrievable IVC filter 07/21/19   Hemorrhagic stroke ([place])    History of kidney stones    Hypertension    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   PE (pulmonary thromboembolism) ([place])    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   Stroke ([place])     RICA, R A1, R MCA occlusion 07/19/19    Past Surgical History:  Procedure Laterality Date   CRANIOPLASTY Right 06/05/2020   Procedure: CRANIOPLASTY;  Surgeon: Consuella Lose, MD;  Location: [facility];  Service: Neurosurgery;  Laterality: Right;  right   CRANIOTOMY Right 07/19/2019   Procedure: RIGHT HEMI-CRANIECTOMY With implantation of skull flap to abdominal wall;  Surgeon: Consuella Lose, MD;  Location: [facility];  Service: Neurosurgery;  Laterality: Right;   CYST EXCISION N/A 10/08/2016   Procedure: EXCISION OF POSTERIOR NECK CYST;  Surgeon: Clovis Riley, MD;  [facility];  Service: General;  Laterality: N/A;   CYSTOSCOPY/URETEROSCOPY/HOLMIUM LASER/STENT PLACEMENT Right 03/16/2020   Procedure: CYSTOSCOPY RIGHT RETROGRADE PYELOGRAM URETEROSCOPY/HOLMIUM LASER/STENT PLACEMENT;  Surgeon: Lucas Mallow, MD;  [facility];  Service: Urology;  Laterality: Right;   INCISION AND DRAINAGE ABSCESS N/A 09/22/2014   Procedure: INCISION AND DRAINAGE ABSCESS POSTERIOR NECK;  Surgeon: Pedro Earls, MD;  [facility];  Service: General;  Laterality: N/A;   INCISION AND DRAINAGE ABSCESS N/A 12/20/2015   Procedure: INCISION AND DRAINAGE POSTERIOR NECK MASS;  Surgeon: Armandina Gemma, MD;  [facility];  Service: General;  Laterality: N/A;   INCISION AND DRAINAGE ABSCESS Left 07/10/2004   middle finger   IR IVC FILTER PLMT / S&I /IMG GUID/MOD SED  07/21/2019   IR RADIOLOGIST EVAL & MGMT  12/14/2019  IR RADIOLOGIST EVAL & MGMT  12/21/2020   IR VENOGRAM RENAL UNI RIGHT  07/21/2019   MASS EXCISION N/A 07/21/2017   Procedure: EXCISION OF BENIGN NECK LESION WITH LAYERED CLOSURE;  Surgeon: Irene Limbo, MD;  Location: [facility];  Service: Plastics;  Laterality: N/A;   MASS EXCISION N/A 11/10/2017   Procedure: EXCISION BENIGN LESION OF THE NECK WITH LAYERED CLOSURE;  Surgeon: Irene Limbo, MD;  Location: [facility];  Service: Plastics;  Laterality: N/A;     There were no vitals filed for this visit.   Subjective Assessment - 07/17/21 1315     Subjective Pt went to urgent care due to pressure sore on bottom. He was prescribed antibiotic. Will be following up with PCP about this.    Pertinent History CVA (previous R MCA CVA), hypercoagulable state, history of PEs and DVT, hypertension    Limitations Walking;House hold activities;Standing    How long can you walk comfortably? Around the house only    Patient Stated Goals Increase walking; improve confidence with ramps    Currently in Pain? No/denies                               [facility] Adult PT Treatment/Exercise - 07/17/21 1317       Ambulation/Gait   Ambulation/Gait Yes    Ambulation/Gait Assistance 5: Supervision    Ambulation/Gait Assistance Details Pt was cued to try to increase left stance time some to take softer step on right. Pt's right leg fatigues as goes on.    Ambulation Distance (Feet) 115 Feet   x 2   Assistive device Large base quad cane   left AFO   Gait Pattern Step-to pattern;Decreased stance time - left;Decreased weight shift to left    Ambulation Surface Level;Indoor    Ramp 5: Supervision    Ramp Details (indicate cue type and reason) CGA on ramp x 2 with large based quad cane with verbal cues to lean back some with descent and forward some with ascent.                    PT Education - 07/17/21 1834     Education Details Discussed discharge today as pt is out of Medicaid visits. Pt verbalized understanding. To continue with his current exercises and his walking.    Person(s) Educated Patient    Methods Explanation    Comprehension Verbalized understanding              PT Short Term Goals - 06/26/21 1637       PT SHORT TERM GOAL #1   Title STG=LTGs               PT Long Term Goals - 07/17/21 1835       PT LONG TERM GOAL #1   Title Pt will increase gait speed from 0.154ms to >0.2106m for improved gait  safety.    Baseline 0.197 m/s    Time 6    Period Weeks    Status Deferred      PT LONG TERM GOAL #2   Title Pt will be able to negotiate incline/decline for at least 10' with quad cane and SBA for improved ability to use church ramp/attain pt's goal to walk up/down his driveway    Baseline 07/17/21 CGA on ramp in clinic 4' x 2 with quad cane. Pt reports that his driveway is much steeper.  Time 6    Period Weeks    Status Partially Met      PT LONG TERM GOAL #3   Title Pt will ambulate >250' with LRAD CGA for improved limited community amb.    Baseline 161' with large based quad cane CGA. 07/17/21 115' x 2 with quad cane supervision    Time 6    Period Weeks    Status Partially Met      PT LONG TERM GOAL #4   Title Pt will have decreased TUG to </=30 sec to demo improved gait    Baseline 38 sec. Deferred    Time 6    Period Weeks    Status Deferred                   Plan - 07/17/21 1837     Clinical Impression Statement PT visits cut short as pt is out of medicaid visits for the year. Deferred most of goals due to only having 2 visits since eval. Pt did do well with ramp negotiation with quad cane CGA with no LOB. He reports that his driveway is steeper. Pt reports that he has been walking a lot at home and brother takes him to park to walk as well. PT discharging today due to lack of medicaid visits and pt in agreement.    Personal Factors and Comorbidities Age;Time since onset of injury/illness/exacerbation;Comorbidity 1;Comorbidity 3+;Comorbidity 2    Comorbidities CVA (previous R MCA CVA), hypercoagulable state, history of PEs and DVT, hypertension    Examination-Activity Limitations Locomotion Level;Stairs    Examination-Participation Restrictions Church;Community Activity;Shop    Stability/Clinical Decision Making Evolving/Moderate complexity    Rehab Potential Good    PT Frequency 1x / week    PT Duration 6 weeks    PT Treatment/Interventions Aquatic  Therapy;ADLs/Self Care Home Management;DME Instruction;Gait training;Stair training;Functional mobility training;Therapeutic activities;Therapeutic exercise;Balance training;Neuromuscular re-education;Orthotic Fit/Training;Patient/family education;Manual techniques;Dry needling;Energy conservation;Taping    PT Next Visit Plan Discharge today    Consulted and Agree with Plan of Care Patient             Patient will benefit from skilled therapeutic intervention in order to improve the following deficits and impairments:  Abnormal gait, Impaired tone, Decreased endurance, Decreased activity tolerance, Decreased balance, Decreased mobility, Decreased strength, Impaired sensation  Visit Diagnosis: Other abnormalities of gait and mobility  Muscle weakness (generalized)     Problem List Patient Active Problem List   Diagnosis Date Noted   COVID-19 virus infection 04/21/2021   Left-sided weakness 04/21/2021   S/P craniotomy 06/05/2020   History of cranioplasty 06/05/2020   Peri-rectal abscess 02/14/2020   Abnormal CT scan, pelvis 02/14/2020   Pancytopenia ([place]) 12/02/2019   Nephrolithiasis 12/02/2019   Hydronephrosis with renal and ureteral calculus obstruction 12/02/2019   Rectal pain 12/02/2019   Hyperkalemia 12/02/2019   Reactive depression    Wound infection after surgery    Sleep disturbance    Dysphagia, post-stroke    Transaminitis    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   Cerebral abscess    Urinary tract infection without hematuria    Altered mental status    Primary hypercoagulable state ([place])    Acute pulmonary embolism without acute cor pulmonale (HCC)    Deep vein thrombosis (DVT) of non-extremity vein    Hypokalemia    Acute blood loss anemia    Leukocytosis    Endotracheal tube present    Acute respiratory failure with hypoxemia ([place])  Stroke (cerebrum) ([place]) 07/19/2019   Pressure injury of skin 07/19/2019   Acute CVA (cerebrovascular  accident) [facility])    Encephalopathy    Dysphagia    Acute encephalopathy    Essential hypertension    Obesity 03/12/2016   Scalp abscess 12/20/2015   Neck abscess 12/20/2015   Pilonidal cyst 02/08/2013    Electa Sniff, PT, DPT, NCS 07/17/2021, 6:40 PM  [facility] [address] [place], [place], [address] Phone: [phone]   Fax:  [phone]  Name: Matthew Black MRN: [id number] Date of Birth: 1976/01/14

## 2021-07-17 NOTE — Therapy (Signed)
[facility] [address] [place], [place], [address] Phone: [phone]   Fax:  [phone]  Occupational Therapy Treatment  Patient Details  Name: Matthew Black MRN: [id number] Date of Birth: January 23, 1976 Referring Provider (OT): Frann Rider   Encounter Date: 07/17/2021   OT End of Session - 07/17/21 1644     Visit Number 4    Number of Visits 7    Date for OT Re-Evaluation 09/09/21    Authorization Type [employer]    Authorization - Visit Number 3    Progress Note Due on Visit 10    OT Start Time [id number]    OT Stop Time [id number]    OT Time Calculation (min) 45 min    Activity Tolerance Patient tolerated treatment well    Behavior During Therapy [employer] for tasks assessed/performed             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   Depression    DVT (deep venous thrombosis) ([place])    BLE DVT 07/21/19, 08/02/19; s/p retrievable IVC filter 07/21/19   Hemorrhagic stroke ([place])    History of kidney stones    Hypertension    Paralysis ([place])    LEFT SIDE   PE (pulmonary thromboembolism) ([place])    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   Stroke ([place])    RICA, R A1, R MCA occlusion 07/19/19    Past Surgical History:  Procedure Laterality Date   CRANIOPLASTY Right 06/05/2020   Procedure: CRANIOPLASTY;  Surgeon: Consuella Lose, MD;  Location: [facility];  Service: Neurosurgery;  Laterality: Right;  right   CRANIOTOMY Right 07/19/2019   Procedure: RIGHT HEMI-CRANIECTOMY With implantation of skull flap to abdominal wall;  Surgeon: Consuella Lose, MD;  Location: [facility];  Service: Neurosurgery;  Laterality: Right;   CYST EXCISION N/A 10/08/2016   Procedure: EXCISION OF POSTERIOR NECK CYST;  Surgeon: Clovis Riley, MD;  Location: [facility];  Service: General;  Laterality: N/A;   CYSTOSCOPY/URETEROSCOPY/HOLMIUM LASER/STENT PLACEMENT Right 03/16/2020    Procedure: CYSTOSCOPY RIGHT RETROGRADE PYELOGRAM URETEROSCOPY/HOLMIUM LASER/STENT PLACEMENT;  Surgeon: Lucas Mallow, MD;  Location[facility];  Service: Urology;  Laterality: Right;   INCISION AND DRAINAGE ABSCESS N/A 09/22/2014   Procedure: INCISION AND DRAINAGE ABSCESS POSTERIOR NECK;  Surgeon: Pedro Earls, MD;  [facility];  Service: General;  Laterality: N/A;   INCISION AND DRAINAGE ABSCESS N/A 12/20/2015   Procedure: INCISION AND DRAINAGE POSTERIOR NECK MASS;  Surgeon: Armandina Gemma, MD;  [facility];  Service: General;  Laterality: N/A;   INCISION AND DRAINAGE ABSCESS Left 07/10/2004   middle finger   IR IVC FILTER PLMT / S&I /IMG GUID/MOD SED  07/21/2019   IR RADIOLOGIST EVAL & MGMT  12/14/2019   IR RADIOLOGIST EVAL & MGMT  12/21/2020   IR VENOGRAM RENAL UNI RIGHT  07/21/2019   MASS EXCISION N/A 07/21/2017   Procedure: EXCISION OF BENIGN NECK LESION WITH LAYERED CLOSURE;  Surgeon: Irene Limbo, MD;  Location: [facility];  Service: Plastics;  Laterality: N/A;   MASS EXCISION N/A 11/10/2017   Procedure: EXCISION BENIGN LESION OF THE NECK WITH LAYERED CLOSURE;  Surgeon: Irene Limbo, MD;  Location: [facility];  Service: Plastics;  Laterality: N/A;    There were no vitals filed for this visit.   Subjective Assessment - 07/17/21 1323     Subjective  I have an ulcer -  it hurts when I walk    Currently in Pain? No/denies    Pain Score 0-No pain                          OT Treatments/Exercises (OP) - 07/17/21 0001       ADLs   Toileting Patient now able to toilet himself during the day.  Working on having stair railing installed to allow him access to upstairs so he can get out of hospital bed in living room.    ADL Comments Patient with recent ED visit for ulcer on coccyx.  Patient sits much of the day - patient here today in w/c without cushion, but he reports he has a reclinign w/c at home that has a cushion.  It may  be helpful to transfer cushion into travel chair.  Patient needs repair to resting hand splint - will drop off at clinic, we will repair and retunr to him.      Neurological Re-education Exercises   Other Exercises 1 Neuromuscular reeducation to address postural control.  Teaching patient self stretching techniques - when patient feels pull in shoulder / discomfort to lean toward left/ tip head to left - immediate resolution of pain,.                      OT Short Term Goals - 07/08/21 1926       OT SHORT TERM GOAL #1   Title Patient will complete an updated HEP to ensure management of RUE    Baseline Has prior program, now with fall / new pain - need to establish new program    Time 4    Period Weeks    Status On-going    Target Date 08/10/21      OT SHORT TERM GOAL #2   Title Patient will don sock on left foot with mod assist    Baseline dependent    Time 4    Period Weeks    Status On-going      OT SHORT TERM GOAL #3   Title Patient will understand positioning for left shoulder for pain relief in bed or wheelchair    Baseline needs occasional assistance to reposition in bed    Time 4    Period Weeks      OT SHORT TERM GOAL #4   Title Patient will demonstrate 90 degrees of sholder flexion passively without increase in pain    Baseline 70 degrees    Time 4    Period Weeks    Status New               OT Long Term Goals - 07/08/21 1926       OT LONG TERM GOAL #1   Title Patient will complete an aquatic exercise program he can complete at his gym with support of family member    Baseline not yet established    Time 6    Period Weeks    Status On-going      OT LONG TERM GOAL #2   Title Patient will don AFO onto RLE with min assist    Baseline dependent    Time 6    Period Weeks    Status On-going      OT LONG TERM GOAL #3   Title Patient will don left shoe with mod assistance    Baseline dependent    Time 6    Period Weeks  Status On-going       OT LONG TERM GOAL #4   Title Patient will demonstrate awareness of compensatory strategies or adaptive equipment to increase independence and safety with cutting and peeling foods    Baseline needs assistance    Time 6    Period Weeks    Status On-going      OT LONG TERM GOAL #5   Title xxx                   Plan - 07/17/21 1645     Clinical Impression Statement Pt approaching end of therapy benefit for this calendar year.  Plan to do final session in pool to train he and his brother for HEP- Aquatic environment.    OT Occupational Profile and History Detailed Assessment- Review of Records and additional review of physical, cognitive, psychosocial history related to current functional performance    Occupational performance deficits (Please refer to evaluation for details): ADL's;IADL's;Work    Body Structure / Function / Physical Skills ADL;Strength;Balance;GMC;Pain;Tone;Body mechanics;Edema;Proprioception;UE functional use;Endurance;IADL;ROM;Coordination;Flexibility;Mobility;Sensation;Decreased knowledge of precautions;FMC    Rehab Potential Good    Clinical Decision Making Several treatment options, min-mod task modification necessary    Comorbidities Affecting Occupational Performance: May have comorbidities impacting occupational performance    Modification or Assistance to Complete Evaluation  No modification of tasks or assist necessary to complete eval    OT Frequency 1x / week    OT Duration 6 weeks    OT Treatment/Interventions Self-care/ADL training;Aquatic Therapy;Moist Heat;DME and/or AE instruction;Splinting;Balance training;Therapeutic activities;Therapeutic exercise;Neuromuscular education;Functional Mobility Training;Patient/family education;Manual Therapy;Electrical Stimulation    Plan HEP Aquatics with patient and his brother    Consulted and Agree with Plan of Care Patient             Patient will benefit from skilled therapeutic intervention in  order to improve the following deficits and impairments:   Body Structure / Function / Physical Skills: ADL, Strength, Balance, GMC, Pain, Tone, Body mechanics, Edema, Proprioception, UE functional use, Endurance, IADL, ROM, Coordination, Flexibility, Mobility, Sensation, Decreased knowledge of precautions, [facility]       Visit Diagnosis: 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)  Chronic left shoulder pain  Stiffness of left shoulder, not elsewhere classified  Muscle weakness (generalized)  Unsteadiness on feet    Problem List Patient Active Problem List   Diagnosis Date Noted   COVID-19 virus infection 04/21/2021   Left-sided weakness 04/21/2021   S/P craniotomy 06/05/2020   History of cranioplasty 06/05/2020   Peri-rectal abscess 02/14/2020   Abnormal CT scan, pelvis 02/14/2020   Pancytopenia ([place]) 12/02/2019   Nephrolithiasis 12/02/2019   Hydronephrosis with renal and ureteral calculus obstruction 12/02/2019   Rectal pain 12/02/2019   Hyperkalemia 12/02/2019   Reactive depression    Wound infection after surgery    Sleep disturbance    Dysphagia, post-stroke    Transaminitis    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   Cerebral abscess    Urinary tract infection without hematuria    Altered mental status    Primary hypercoagulable state ([facility])    Acute pulmonary embolism without acute cor pulmonale (HCC)    Deep vein thrombosis (DVT) of non-extremity vein    Hypokalemia    Acute blood loss anemia    Leukocytosis    Endotracheal tube present    Acute respiratory failure with hypoxemia (HCC)    Stroke (cerebrum) ([place]) 07/19/2019   Pressure injury of skin 07/19/2019  Acute CVA (cerebrovascular accident) ([facility])    Encephalopathy    Dysphagia    Acute encephalopathy    Essential hypertension    Obesity 03/12/2016   Scalp abscess 12/20/2015   Neck abscess 12/20/2015   Pilonidal cyst 02/08/2013     Mariah Milling 07/17/2021, 4:47 PM  [facility] [address] [place], [place], [address] Phone: [phone]   Fax:  [phone]  Name: Matthew Black MRN: [id number] Date of Birth: [birth year]

## 2021-07-19 ENCOUNTER — Telehealth: Payer: Self-pay | Admitting: Medical

## 2021-07-19 ENCOUNTER — Other Ambulatory Visit: Payer: Self-pay

## 2021-07-19 ENCOUNTER — Ambulatory Visit: Payer: Medicaid Other | Admitting: Medical

## 2021-07-19 VITALS — BP 132/80 | HR 73 | Resp 18

## 2021-07-19 DIAGNOSIS — E7849 Other hyperlipidemia: Secondary | ICD-10-CM

## 2021-07-19 DIAGNOSIS — Z9889 Other specified postprocedural states: Secondary | ICD-10-CM

## 2021-07-19 DIAGNOSIS — L0291 Cutaneous abscess, unspecified: Secondary | ICD-10-CM

## 2021-07-19 DIAGNOSIS — Z8673 Personal history of transient ischemic attack (TIA), and cerebral infarction without residual deficits: Secondary | ICD-10-CM | POA: Diagnosis not present

## 2021-07-19 DIAGNOSIS — I10 Essential (primary) hypertension: Secondary | ICD-10-CM

## 2021-07-19 DIAGNOSIS — L089 Local infection of the skin and subcutaneous tissue, unspecified: Secondary | ICD-10-CM | POA: Diagnosis not present

## 2021-07-19 NOTE — Telephone Encounter (Signed)
Referral to general surgeon placed. Can he be seen next week?

## 2021-07-19 NOTE — Patient Instructions (Addendum)
Your blood pressure is well controlled. Contiue metoprolol.  For high cholesterol continue cholesterol.   For history of stroke and left side deficits significant will try to see if you can get more assistance with community actions program. Continue baclofen.  Attend OT appointment and discuss with her about your challenges dressing, cooking and washing.   For depession continue celexa.  You appear to have probable skin infection or abscess. I don't see ulceration at the sacral dimple.  If you could get gauze soaked in warm salt water and apply to area twice daily for 7 days. Continue doxycycline. Will follow wound culture and may change antibiotic. Will refer you to general surgeon as well as may need I and D.  Follow up 7 days or sooner if needed.

## 2021-07-19 NOTE — Progress Notes (Signed)
Subjective:    Patient ID: Matthew Black, male    DOB: 08/11/1976, 45 y.o.   MRN: [id number]  HPI  Pt in for follow up.  Pt has hx of stroke. Pt states he needs he needs aid to help him bath and to prepare food. Pt has medicaid.   Pt has been doing PT and OT.  He state aquatic therapy was very helpful recently.  "Patient seen for his first aquatic therapy visit.  Patient entered and exited pool via stairs max assist for right foot placement and loading.   Session occurred in 3.5-4.5 ft of water.   Patient with very poor alignment of right foot, and had difficulty maintaining foot on the ground.  Walked on pool deck to pool's edge to remove AFO and shoes.  Patient reports he has not walked without AFO for 2 years.   Patient initially anxious regarding walking in pool but with facilitation and support walked (mostly step to pattern) with mod assist.  With repetition and assist for right weight shift, able to walk with step through pattern.  Working to decrease reliance on UE's for walking.  Placed aircast on right ankle to help provide better alignment for base of support.   In supine worked to relax and allow body on arm movement to reduce tension in RUE.  Patient demo'd slight active movement into abduction/adduction and rotation at [facility] joint following dynamic stretching. "  Post stroke pt has both left side upper and lower extremity deficits.  Pt aphasia has resolved significantly per his report.   Pt went to UC about 2 weeks ago on 07/05/21. He was dx with pressure ulcer stage 2. Pt was given antibiotic in the urgen care.   Pt states still some depression. Pt is on celexa 20 mg daily.     On review pt not on aspirin.   Since dc from hospital pt states he is doing well.   Pt was on baclofen post hospitalization. He ran Saturday and has spasm of left leg since then.   Hx of htn. Pt is on lopressor 50 mg daily.  Hx of high cholesterol. Pt is on atorvastatin 10 mg  daily.      Review of Systems  Constitutional:  Negative for chills, fatigue and fever.  HENT:  Negative for congestion and drooling.   Respiratory:  Negative for cough, chest tightness, shortness of breath and wheezing.   Cardiovascular:  Negative for chest pain and palpitations.  Gastrointestinal:  Negative for abdominal pain.  Neurological:  Negative for dizziness, speech difficulty, weakness, numbness and headaches.  Hematological:  Negative for adenopathy.  Psychiatric/Behavioral:  Negative for behavioral problems and confusion.      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   PE (pulmonary thromboembolism) ([place])    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   Stroke ([place])    RICA, R A1, R MCA occlusion 07/19/19     Social History   Socioeconomic History   Marital status: Married    Spouse name: Melene Muller   Number of children: 2   Years of education: Not on file   Highest education level: Not on file  Occupational History   Occupation: unemployed 01/30/21  Tobacco Use   Smoking status: Former    Packs/day: 0.00    Types: Cigarettes    Quit date: 11/24/2015    Years since quitting: 5.6   Smokeless tobacco: Never   Tobacco comments:       Vaping Use   Vaping Use: Never used  Substance and Sexual Activity   Alcohol use: Yes    Comment: socially   Drug use: Never   Sexual activity: Not Currently  Other Topics Concern   Not on file  Social History Narrative   Lives with wife and kids   Right Handed   Drinks >10 cups caffeine daily   Social Determinants of Health   Financial Resource Strain: Not on file  Food Insecurity: Not on file  Transportation Needs: Not on file  Physical  Activity: Not on file  Stress: Not on file  Social Connections: Not on file  Intimate Partner Violence: Not on file    Past Surgical History:  Procedure Laterality Date   CRANIOPLASTY Right 06/05/2020   Procedure: CRANIOPLASTY;  Surgeon: Consuella Lose, MD;  Location: [facility];  Service: Neurosurgery;  Laterality: Right;  right   CRANIOTOMY Right 07/19/2019   Procedure: RIGHT HEMI-CRANIECTOMY With implantation of skull flap to abdominal wall;  Surgeon: Consuella Lose, MD;  Location: [facility];  Service: Neurosurgery;  Laterality: Right;   CYST EXCISION N/A 10/08/2016   Procedure: EXCISION OF POSTERIOR NECK CYST;  Surgeon: Clovis Riley, MD;  [facility];  Service: General;  Laterality: N/A;   CYSTOSCOPY/URETEROSCOPY/HOLMIUM LASER/STENT PLACEMENT Right 03/16/2020   Procedure: CYSTOSCOPY RIGHT RETROGRADE PYELOGRAM URETEROSCOPY/HOLMIUM LASER/STENT PLACEMENT;  Surgeon: Lucas Mallow, MD;  [facility];  Service: Urology;  Laterality: Right;   INCISION AND DRAINAGE ABSCESS N/A 09/22/2014   Procedure: INCISION AND DRAINAGE ABSCESS POSTERIOR NECK;  Surgeon: Pedro Earls, MD;  [facility];  Service: General;  Laterality: N/A;   INCISION AND DRAINAGE ABSCESS N/A 12/20/2015   Procedure: INCISION AND DRAINAGE POSTERIOR NECK MASS;  Surgeon: Armandina Gemma, MD;  [facility];  Service: General;  Laterality: N/A;   INCISION AND DRAINAGE ABSCESS Left 07/10/2004   middle finger   IR IVC FILTER PLMT / S&I /IMG GUID/MOD SED  07/21/2019   IR RADIOLOGIST EVAL & MGMT  12/14/2019   IR RADIOLOGIST EVAL & MGMT  12/21/2020   IR VENOGRAM RENAL UNI RIGHT  07/21/2019   MASS EXCISION N/A 07/21/2017   Procedure: EXCISION OF BENIGN NECK LESION WITH LAYERED CLOSURE;  Surgeon: Irene Limbo, MD;  Location: [facility];  Service: Plastics;  Laterality: N/A;   MASS EXCISION N/A 11/10/2017   Procedure: EXCISION BENIGN LESION OF THE NECK WITH LAYERED CLOSURE;  Surgeon: Irene Limbo,  MD;  Location: [facility];  Service: Plastics;  Laterality: N/A;    Family History  Problem Relation Age of Onset   Healthy Mother    Healthy Father    Clotting disorder Maternal Grandmother    Diabetes Paternal Grandfather    Clotting disorder Maternal Aunt    Clotting disorder Maternal Uncle    Colon cancer Neg Hx     Allergies  Allergen Reactions   Hydrocodone Nausea Only    Current Outpatient Medications on File Prior to Visit  Medication Sig Dispense Refill   aspirin EC 81 MG tablet Take 1 tablet (81 mg total) by mouth daily. Swallow whole. 30 tablet 11   atorvastatin (LIPITOR) 10 MG tablet  TAKE 1 TABLET BY MOUTH EVERY DAY (Patient taking differently: Take 10 mg by mouth daily.) 90 tablet 1   baclofen (LIORESAL) 10 MG tablet Take 1 tablet (10 mg total) by mouth 2 (two) times daily. 60 tablet 1   cyclobenzaprine (FLEXERIL) 5 MG tablet Take 1 tablet (5 mg total) by mouth at bedtime. 15 tablet 0   doxycycline (VIBRAMYCIN) 100 MG capsule Take 1 capsule (100 mg total) by mouth 2 (two) times daily. 14 capsule 0   ibuprofen (ADVIL) 800 MG tablet Take 1 tablet (800 mg total) by mouth 3 (three) times daily. 21 tablet 0   meloxicam (MOBIC) 15 MG tablet Take 1 tablet (15 mg total) by mouth daily. 30 tablet 0   metoprolol tartrate (LOPRESSOR) 50 MG tablet Take 1 tablet (50 mg total) by mouth daily. 180 tablet 1   No current facility-administered medications on file prior to visit.    BP 132/80 (BP Location: Right Arm, Patient Position: Sitting, Cuff Size: Large)   Pulse 73   Resp 18   SpO2 97%        Objective:   Physical Exam  General Mental Status- Alert. General Appearance- Not in acute distress.   Skin General: Color- Normal Color. Moisture- Normal Moisture.  Neck Carotid Arteries- Normal color. Moisture- Normal Moisture. No carotid bruits. No JVD.  Chest and Lung Exam Auscultation: Breath Sounds:-Normal.  Cardiovascular Auscultation:Rythm-  Regular. Murmurs & Other Heart Sounds:Auscultation of the heart reveals- No Murmurs.  Abdomen Inspection:-Inspeection Normal. Palpation/Percussion:Note:No mass. Palpation and Percussion of the abdomen reveal- Non Tender, Non Distended + BS, no rebound or guarding.  Neurologic Cranial Nerve exam:- CN III-XII intact(No nystagmus), symmetric smile. Drift Test:- No drift. Romberg Exam:- Negative.  Heal to Toe Gait exam:-Normal. Finger to Nose:- Normal/Intact Strength:- rt upper and lower ext normal. Left upper and lower ext- 1/5 weakness. Minimal range of motion.   Skin- just right of sacral dimple has area of raised soft tissue about 1 cm x 1 cm. Not fluctuant. Tender to palpation. I don't see ulcer present.    Assessment & Plan:   Your blood pressure is well controlled. Contiue metoprolol.  For high cholesterol continue cholesterol.   For history of stroke and left side deficits significant will try to see if you can get more assistance with community actions program. Continue baclofen.  Attend OT appointment and discuss with her about your challenges dressing, cooking and washing.   For depession continue celexa.  You appear to have probable skin infection or abscess. I don't see ulceration at the sacral dimple.  If you could get gauze soaked in warm salt water and apply to area twice daily for 7 days. Continue doxycycline. Will follow wound culture and may change antibiotic. Will refer you to general surgeon as well as may need I and D.  Follow up 7 days or sooner if needed.  Time spent with patient today was 41  minutes which consisted of chart revdiew, discussing diagnosis, work up treatment and documentation.   Mackie Pai, PA-C

## 2021-07-19 NOTE — Telephone Encounter (Signed)
Pt has hx of stroke. Severe let side deficits. Can you call community action program for disabled adults to see if pt may qualify for help. Not familiar with steps?

## 2021-07-22 ENCOUNTER — Other Ambulatory Visit: Payer: Self-pay | Admitting: Medical

## 2021-07-22 ENCOUNTER — Other Ambulatory Visit: Payer: Self-pay | Admitting: Family Medicine

## 2021-07-22 DIAGNOSIS — S46812A Strain of other muscles, fascia and tendons at shoulder and upper arm level, left arm, initial encounter: Secondary | ICD-10-CM

## 2021-07-22 LAB — WOUND CULTURE
MICRO NUMBER:: 12296914
SPECIMEN QUALITY:: ADEQUATE

## 2021-07-22 NOTE — Telephone Encounter (Signed)
Referral request for [employer] was faxed. Facesheet and Insurance card included.

## 2021-07-22 NOTE — Telephone Encounter (Signed)
I have added "Pt has hx of stroke. Severe let side deficits" to the form, anything else need to be added?

## 2021-07-22 NOTE — Telephone Encounter (Signed)
This is for the home health aid correct? Different phone numbers go to different departments. I am trying to make sure I call the correct number.

## 2021-07-24 ENCOUNTER — Ambulatory Visit: Payer: Medicaid Other | Admitting: Occupational Therapy

## 2021-07-24 ENCOUNTER — Ambulatory Visit: Payer: Medicaid Other

## 2021-07-26 ENCOUNTER — Ambulatory Visit: Payer: Medicaid Other | Admitting: Medical

## 2021-07-31 ENCOUNTER — Ambulatory Visit: Payer: Medicaid Other

## 2021-07-31 ENCOUNTER — Ambulatory Visit: Payer: Medicaid Other | Admitting: Medical

## 2021-07-31 ENCOUNTER — Encounter: Payer: Medicaid Other | Admitting: Occupational Therapy

## 2021-08-05 ENCOUNTER — Ambulatory Visit: Payer: Medicaid Other | Attending: Family Medicine | Admitting: Occupational Therapy

## 2021-08-05 ENCOUNTER — Encounter: Payer: Self-pay | Admitting: Occupational Therapy

## 2021-08-05 ENCOUNTER — Other Ambulatory Visit: Payer: Self-pay

## 2021-08-05 DIAGNOSIS — R208 Other disturbances of skin sensation: Secondary | ICD-10-CM | POA: Insufficient documentation

## 2021-08-05 DIAGNOSIS — I69354 Hemiplegia and hemiparesis following cerebral infarction affecting left non-dominant side: Secondary | ICD-10-CM | POA: Diagnosis not present

## 2021-08-05 DIAGNOSIS — R2681 Unsteadiness on feet: Secondary | ICD-10-CM | POA: Insufficient documentation

## 2021-08-05 DIAGNOSIS — G8929 Other chronic pain: Secondary | ICD-10-CM | POA: Insufficient documentation

## 2021-08-05 DIAGNOSIS — M25512 Pain in left shoulder: Secondary | ICD-10-CM | POA: Diagnosis not present

## 2021-08-05 DIAGNOSIS — M6281 Muscle weakness (generalized): Secondary | ICD-10-CM | POA: Diagnosis not present

## 2021-08-05 DIAGNOSIS — M25612 Stiffness of left shoulder, not elsewhere classified: Secondary | ICD-10-CM | POA: Diagnosis not present

## 2021-08-05 DIAGNOSIS — R293 Abnormal posture: Secondary | ICD-10-CM | POA: Diagnosis not present

## 2021-08-05 NOTE — Therapy (Signed)
[facility] [address] [place], [place], [address] Phone: [phone]   Fax:  [phone]  Occupational Therapy Treatment  Patient Details  Name: Matthew Black MRN: [id number] Date of Birth: 1975-12-26 Referring Provider (OT): Frann Rider   Encounter Date: 08/05/2021   OT End of Session - 08/05/21 1322     Visit Number 5    Number of Visits 7    Date for OT Re-Evaluation 09/09/21    Authorization Type [employer]    OT Start Time 1215    OT Stop Time 1310    OT Time Calculation (min) 55 min    Activity Tolerance Patient tolerated treatment well    Behavior During Therapy [facility] for tasks assessed/performed             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   Depression    DVT (deep venous thrombosis) ([place])    BLE DVT 07/21/19, 08/02/19; s/p retrievable IVC filter 07/21/19   Hemorrhagic stroke ([place])    History of kidney stones    Hypertension    Paralysis ([place])    LEFT SIDE   PE (pulmonary thromboembolism) ([place])    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   Stroke ([place])    RICA, R A1, R MCA occlusion 07/19/19    Past Surgical History:  Procedure Laterality Date   CRANIOPLASTY Right 06/05/2020   Procedure: CRANIOPLASTY;  Surgeon: Consuella Lose, MD;  Location: [facility];  Service: Neurosurgery;  Laterality: Right;  right   CRANIOTOMY Right 07/19/2019   Procedure: RIGHT HEMI-CRANIECTOMY With implantation of skull flap to abdominal wall;  Surgeon: Consuella Lose, MD;  Location: [facility];  Service: Neurosurgery;  Laterality: Right;   CYST EXCISION N/A 10/08/2016   Procedure: EXCISION OF POSTERIOR NECK CYST;  Surgeon: Clovis Riley, MD;  [facility];  Service: General;  Laterality: N/A;   CYSTOSCOPY/URETEROSCOPY/HOLMIUM LASER/STENT PLACEMENT Right 03/16/2020   Procedure: CYSTOSCOPY RIGHT RETROGRADE PYELOGRAM URETEROSCOPY/HOLMIUM  LASER/STENT PLACEMENT;  Surgeon: Lucas Mallow, MD;  [facility];  Service: Urology;  Laterality: Right;   INCISION AND DRAINAGE ABSCESS N/A 09/22/2014   Procedure: INCISION AND DRAINAGE ABSCESS POSTERIOR NECK;  Surgeon: Pedro Earls, MD;  [facility];  Service: General;  Laterality: N/A;   INCISION AND DRAINAGE ABSCESS N/A 12/20/2015   Procedure: INCISION AND DRAINAGE POSTERIOR NECK MASS;  Surgeon: Armandina Gemma, MD;  [facility];  Service: General;  Laterality: N/A;   INCISION AND DRAINAGE ABSCESS Left 07/10/2004   middle finger   IR IVC FILTER PLMT / S&I /IMG GUID/MOD SED  07/21/2019   IR RADIOLOGIST EVAL & MGMT  12/14/2019   IR RADIOLOGIST EVAL & MGMT  12/21/2020   IR VENOGRAM RENAL UNI RIGHT  07/21/2019   MASS EXCISION N/A 07/21/2017   Procedure: EXCISION OF BENIGN NECK LESION WITH LAYERED CLOSURE;  Surgeon: Irene Limbo, MD;  Location: [facility];  Service: Plastics;  Laterality: N/A;   MASS EXCISION N/A 11/10/2017   Procedure: EXCISION BENIGN LESION OF THE NECK WITH LAYERED CLOSURE;  Surgeon: Irene Limbo, MD;  Location: [facility];  Service: Plastics;  Laterality: N/A;    There were no vitals filed for this visit.   Subjective Assessment - 08/05/21 1319     Subjective  I brought my brother - he will get in with [place].    Patient is accompanied by: Family member  Currently in Pain? No/denies                 Pt seen for aquatic therapy today.  Treatment took place in water 3.25-4.8 ft in depth at the [employer] pool. Temp of water was 95. entered/exited the pool via stairs  with right rail and min assist.   Patient's brother present for this session and in water for entire time.  Used air cast for left ankle support.  Attempted aircast with water shoes, but unable to fit both.  Worked on walking with water walker and mod assist for midline orientation.  Patient with strong preference for right posterior weight  shift.  Worked on loading and activating left leg during stance and walking.  Worked on even step cadence, difficulty with even step length.   Worked at wall to stretch hips, calves, ankles to aide with stance phase.   Supine floatation for relaxation and stretch to left shoulder, left trunk, left hip.   Pt requires buoyancy for support and to offload joints with strengthening exercises. Viscosity of the water is needed for resistance of strengthening; water current perturbations provides challenge to standing balance unsupported, requiring increased core activation.                   OT Education - 08/05/21 1321     Education Details Hands on education with patient and brother to address water walking, stretching, supine floatation.    Person(s) Educated Patient;Other (comment)   sibling   Methods Explanation;Demonstration;Tactile cues;Verbal cues    Comprehension Verbalized understanding;Returned demonstration              OT Short Term Goals - 08/05/21 1325       OT SHORT TERM GOAL #1   Title Patient will complete an updated HEP to ensure management of RUE    Baseline Has prior program, now with fall / new pain - need to establish new program    Time 4    Period Weeks    Status Achieved    Target Date 08/10/21      OT SHORT TERM GOAL #2   Title Patient will don sock on left foot with mod assist    Baseline dependent    Time 4    Period Weeks    Status On-going      OT SHORT TERM GOAL #3   Title Patient will understand positioning for left shoulder for pain relief in bed or wheelchair    Baseline needs occasional assistance to reposition in bed    Time 4    Period Weeks    Status Achieved      OT SHORT TERM GOAL #4   Title Patient will demonstrate 90 degrees of sholder flexion passively without increase in pain    Baseline 70 degrees    Time 4    Period Weeks    Status Achieved               OT Long Term Goals - 08/05/21 1326       OT LONG  TERM GOAL #1   Title Patient will complete an aquatic exercise program he can complete at his gym with support of family member    Baseline not yet established    Time 6    Period Weeks    Status Achieved      OT LONG TERM GOAL #2   Title Patient will don AFO onto RLE with min assist  Baseline dependent    Time 6    Period Weeks    Status Not Met      OT LONG TERM GOAL #3   Title Patient will don left shoe with mod assistance    Baseline dependent    Time 6    Period Weeks    Status Not Met      OT LONG TERM GOAL #4   Title Patient will demonstrate awareness of compensatory strategies or adaptive equipment to increase independence and safety with cutting and peeling foods    Baseline needs assistance    Time 6    Period Weeks    Status Not Met      OT LONG TERM GOAL #5   Title xxx                   Plan - 08/05/21 1323     Clinical Impression Statement Patient and brother completed aquatic therapy session this date, and hope to use this as means to exercise at the [facility].  Brother very comfortable assisting, going at patient's pace, and encouraging patient.  Very aware of safety.    OT Occupational Profile and History Detailed Assessment- Review of Records and additional review of physical, cognitive, psychosocial history related to current functional performance    Occupational performance deficits (Please refer to evaluation for details): ADL's;IADL's;Work    Body Structure / Function / Physical Skills ADL;Strength;Balance;GMC;Pain;Tone;Body mechanics;Edema;Proprioception;UE functional use;Endurance;IADL;ROM;Coordination;Flexibility;Mobility;Sensation;Decreased knowledge of precautions;FMC    Rehab Potential Good    Clinical Decision Making Several treatment options, min-mod task modification necessary    Comorbidities Affecting Occupational Performance: May have comorbidities impacting occupational performance    Modification or Assistance to Complete Evaluation   No modification of tasks or assist necessary to complete eval    OT Frequency 1x / week    OT Duration 6 weeks    OT Treatment/Interventions Self-care/ADL training;Aquatic Therapy;Moist Heat;DME and/or AE instruction;Splinting;Balance training;Therapeutic activities;Therapeutic exercise;Neuromuscular education;Functional Mobility Training;Patient/family education;Manual Therapy;Electrical Stimulation    Plan Discharge for year    Consulted and Agree with Plan of Care Patient             Patient will benefit from skilled therapeutic intervention in order to improve the following deficits and impairments:   Body Structure / Function / Physical Skills: ADL, Strength, Balance, GMC, Pain, Tone, Body mechanics, Edema, Proprioception, UE functional use, Endurance, IADL, ROM, Coordination, Flexibility, Mobility, Sensation, Decreased knowledge of precautions, [facility]       Visit Diagnosis: 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)  Chronic left shoulder pain  Stiffness of left shoulder, not elsewhere classified  Muscle weakness (generalized)  Unsteadiness on feet  Abnormal posture  Other disturbances of skin sensation    Problem List Patient Active Problem List   Diagnosis Date Noted   COVID-19 virus infection 04/21/2021   Left-sided weakness 04/21/2021   S/P craniotomy 06/05/2020   History of cranioplasty 06/05/2020   Peri-rectal abscess 02/14/2020   Abnormal CT scan, pelvis 02/14/2020   Pancytopenia ([place]) 12/02/2019   Nephrolithiasis 12/02/2019   Hydronephrosis with renal and ureteral calculus obstruction 12/02/2019   Rectal pain 12/02/2019   Hyperkalemia 12/02/2019   Reactive depression    Wound infection after surgery    Sleep disturbance    Dysphagia, post-stroke    Transaminitis    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   Cerebral abscess    Urinary tract infection without hematuria    Altered mental  status     Primary hypercoagulable state ([place])    Acute pulmonary embolism without acute cor pulmonale (HCC)    Deep vein thrombosis (DVT) of non-extremity vein    Hypokalemia    Acute blood loss anemia    Leukocytosis    Endotracheal tube present    Acute respiratory failure with hypoxemia (HCC)    Stroke (cerebrum) ([place]) 07/19/2019   Pressure injury of skin 07/19/2019   Acute CVA (cerebrovascular accident) ([place])    Encephalopathy    Dysphagia    Acute encephalopathy    Essential hypertension    Obesity 03/12/2016   Scalp abscess 12/20/2015   Neck abscess 12/20/2015   Pilonidal cyst 02/08/2013   OCCUPATIONAL THERAPY DISCHARGE SUMMARY  Visits from Start of Care: 5  Current functional level related to goals / functional outcomes: Improved participation with ADL   Remaining deficits: Spastic hemiplegia, perceptual deficits   Education / Equipment: HEP for home and aquatic environment   Patient agrees to discharge. Patient goals were partially met. Patient is being discharged due to financial reasons.  Will plan to retunr at beginning of new benefit year.    Mariah Milling, OT/L 08/05/2021, 1:28 PM  [facility] [address] [place], [place], [address] Phone: [phone]   Fax:  [phone]  Name: Matthew Black MRN: [id number] Date of Birth: 31-Jan-1976

## 2021-08-06 ENCOUNTER — Ambulatory Visit: Payer: Medicaid Other | Admitting: Medical

## 2021-08-07 ENCOUNTER — Encounter: Payer: Medicaid Other | Admitting: Occupational Therapy

## 2021-08-07 ENCOUNTER — Ambulatory Visit: Payer: Medicaid Other

## 2021-08-12 ENCOUNTER — Other Ambulatory Visit: Payer: Self-pay | Admitting: Medical

## 2021-08-12 ENCOUNTER — Other Ambulatory Visit: Payer: Self-pay | Admitting: Family Medicine

## 2021-08-12 DIAGNOSIS — S46812A Strain of other muscles, fascia and tendons at shoulder and upper arm level, left arm, initial encounter: Secondary | ICD-10-CM

## 2021-09-06 ENCOUNTER — Other Ambulatory Visit: Payer: Self-pay | Admitting: Family Medicine

## 2021-09-06 ENCOUNTER — Other Ambulatory Visit: Payer: Self-pay | Admitting: Neurology

## 2021-09-06 ENCOUNTER — Other Ambulatory Visit: Payer: Self-pay | Admitting: Medical

## 2021-09-06 DIAGNOSIS — S46812A Strain of other muscles, fascia and tendons at shoulder and upper arm level, left arm, initial encounter: Secondary | ICD-10-CM

## 2021-09-09 ENCOUNTER — Ambulatory Visit: Payer: Medicaid Other | Admitting: Psychology

## 2021-09-20 ENCOUNTER — Telehealth: Payer: Self-pay | Admitting: Medical

## 2021-09-20 ENCOUNTER — Ambulatory Visit (INDEPENDENT_AMBULATORY_CARE_PROVIDER_SITE_OTHER): Payer: Medicaid Other

## 2021-09-20 ENCOUNTER — Other Ambulatory Visit: Payer: Self-pay

## 2021-09-20 ENCOUNTER — Encounter: Payer: Self-pay | Admitting: Medical

## 2021-09-20 DIAGNOSIS — Z23 Encounter for immunization: Secondary | ICD-10-CM

## 2021-09-20 NOTE — Telephone Encounter (Signed)
Patient dropped off papers to be filed out by [employer] on 09/20/21.Forms are for request for independent assessment for personal care services [facility]) attestation of medical need. Forms placed on providers box.

## 2021-09-21 ENCOUNTER — Other Ambulatory Visit: Payer: Self-pay | Admitting: Neurology

## 2021-09-23 ENCOUNTER — Ambulatory Visit: Payer: Medicaid Other | Admitting: Adult Health

## 2021-09-23 ENCOUNTER — Ambulatory Visit: Payer: Medicaid Other | Admitting: Psychology

## 2021-09-24 ENCOUNTER — Encounter: Payer: Self-pay | Admitting: Physical Medicine and Rehabilitation

## 2021-09-24 ENCOUNTER — Other Ambulatory Visit: Payer: Self-pay

## 2021-09-24 ENCOUNTER — Encounter
Payer: Medicaid Other | Attending: Physical Medicine and Rehabilitation | Admitting: Physical Medicine and Rehabilitation

## 2021-09-24 DIAGNOSIS — I69398 Other sequelae of cerebral infarction: Secondary | ICD-10-CM | POA: Diagnosis not present

## 2021-09-24 DIAGNOSIS — R252 Cramp and spasm: Secondary | ICD-10-CM | POA: Diagnosis not present

## 2021-09-24 NOTE — Progress Notes (Signed)
.  botBotox Injection for spasticity and left sided UE internal rotation using ultrasound guidance  Indication: Severe spasticity which interferes with ADL,mobility and/or  hygiene and is unresponsive to medication management and other conservative care Informed consent was obtained after describing risks and benefits of the procedure with the patient. This includes bleeding, bruising, infection, excessive weakness, or medication side effects. A REMS form is on file and signed. Needle: 25cc needle Number of units per muscle Left pectoralis major: 100U in 3 sites  100 U injected into 3 sites All injections were done after obtaining appropriate ultrasound visualization (pictures taken) and after negative drawback for blood. The patient tolerated the procedure well. Post procedure instructions were given. A followup appointment was made.

## 2021-09-30 ENCOUNTER — Telehealth: Payer: Self-pay

## 2021-09-30 NOTE — Telephone Encounter (Signed)
Patient called clinic to speak with Dr. Ranell Patrick. Attempted to call patient back, but mailbox is full

## 2021-10-01 ENCOUNTER — Telehealth: Payer: Self-pay

## 2021-10-01 ENCOUNTER — Encounter: Payer: Self-pay | Admitting: Physical Medicine and Rehabilitation

## 2021-10-01 DIAGNOSIS — I63511 Cerebral infarction due to unspecified occlusion or stenosis of right middle cerebral artery: Secondary | ICD-10-CM

## 2021-10-01 NOTE — Telephone Encounter (Signed)
Matthew Black license is up for renewal. He has requested a letter addressed to whom it may concerns about his medical condition, and limitations.    If possible please call Matthew Black back for specific instructions.   Call back phone [phone]. Thank you

## 2021-10-02 NOTE — Telephone Encounter (Signed)
He called back and will pick it up.

## 2021-10-02 NOTE — Telephone Encounter (Signed)
I am mailing letter. His Mychart is not set up so Dr Aline August letter did not get sent to him. His VM is full and you can not leave a message for him.

## 2021-10-07 ENCOUNTER — Ambulatory Visit: Payer: Medicaid Other | Admitting: Psychology

## 2021-10-23 ENCOUNTER — Other Ambulatory Visit: Payer: Self-pay | Admitting: Neurology

## 2021-10-23 ENCOUNTER — Other Ambulatory Visit: Payer: Self-pay | Admitting: Medical

## 2021-10-29 NOTE — Telephone Encounter (Signed)
Paperwork was in folder and had not been done yet.  It has been placed in the red folder for review.  Also apologized that this happened.

## 2021-10-29 NOTE — Telephone Encounter (Signed)
Patient would like to know when his paperwork will be filled out. Please advice.

## 2021-11-05 NOTE — Telephone Encounter (Signed)
Called pt and unable to lvm , called to notifiy pt his paperwork is ready and at front desk

## 2021-11-06 ENCOUNTER — Ambulatory Visit: Payer: Medicaid Other | Admitting: Medical

## 2021-11-06 VITALS — BP 118/72 | HR 65 | Temp 98.2°F | Ht 71.0 in | Wt 237.0 lb

## 2021-11-06 DIAGNOSIS — Z8673 Personal history of transient ischemic attack (TIA), and cerebral infarction without residual deficits: Secondary | ICD-10-CM

## 2021-11-06 DIAGNOSIS — I10 Essential (primary) hypertension: Secondary | ICD-10-CM | POA: Diagnosis not present

## 2021-11-06 DIAGNOSIS — E7849 Other hyperlipidemia: Secondary | ICD-10-CM

## 2021-11-06 DIAGNOSIS — F329 Major depressive disorder, single episode, unspecified: Secondary | ICD-10-CM | POA: Diagnosis not present

## 2021-11-06 MED ORDER — VENLAFAXINE HCL ER 37.5 MG PO CP24: 37.5000 mg | ORAL_CAPSULE | Freq: Every day | ORAL | 3 refills | Status: DC

## 2021-11-06 NOTE — Progress Notes (Signed)
Subjective:    Patient ID: Matthew Black, male    DOB: 19-Oct-1976, 45 y.o.   MRN: [id number]  HPI  Pt in for follow up.  Pt has hx of stroke. Pt states he needs he needs aid to help him bath and to prepare food. Pt has medicaid.   Post stroke pt has both left side upper and lower extremity deficits.   Pt aphasia has resolved significantly per his report. But on review today he admits has some difficulty expressing himself.   Pt tells me no longer on celexa. He states caused ha or stomach. Only used for short time. He is willing to try different depression med.  Pt was on baclofen post hospitalization. He ran Saturday and has spasm of left leg since then.    Hx of htn. Pt is on lopressor 50 mg daily.   Hx of high cholesterol. Pt is on atorvastatin 10 mg daily.   Pt last visit with specialist .  ".botBotox Injection for spasticity and left sided UE internal rotation using ultrasound guidance   Indication: Severe spasticity which interferes with ADL,mobility and/or  hygiene and is unresponsive to medication management and other conservative care Informed consent was obtained after describing risks and benefits of the procedure with the patient. This includes bleeding, bruising, infection, excessive weakness, or medication side effects. A REMS form is on file and signed. Needle: 25cc needle Number of units per muscle Left pectoralis major: 100U in 3 sites"   I filled out form for pt. He states timid, afraid, not confident, and depressed since stroke. He has some aphasia.   Review of Systems  Constitutional:  Negative for appetite change, diaphoresis, fatigue and fever.  HENT:  Negative for congestion, drooling, ear pain and facial swelling.   Respiratory:  Negative for cough, chest tightness, shortness of breath and wheezing.   Cardiovascular:  Negative for chest pain and palpitations.  Gastrointestinal:  Negative for abdominal pain, blood in stool and diarrhea.   Musculoskeletal:  Negative for back pain, joint swelling and neck pain.  Skin:  Negative for rash.  Neurological:  Negative for dizziness, facial asymmetry and headaches.       Left upper ext and lower ext deficits.  Hematological:  Negative for adenopathy. Does not bruise/bleed easily.  Psychiatric/Behavioral:  Negative for behavioral problems and confusion.     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   PE (pulmonary thromboembolism) ([place])    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   Stroke ([place])    RICA, R A1, R MCA occlusion 07/19/19     Social History   Socioeconomic History   Marital status: Married    Spouse name: Melene Muller   Number of children: 2   Years of education: Not on file   Highest education level: Not on file  Occupational History   Occupation: unemployed 01/30/21  Tobacco Use   Smoking status: Former    Packs/day: 0.00    Types: Cigarettes    Quit date: 11/24/2015    Years since quitting: 5.9   Smokeless tobacco: Never   Tobacco comments:       Vaping Use   Vaping  Use: Never used  Substance and Sexual Activity   Alcohol use: Yes    Comment: socially   Drug use: Never   Sexual activity: Not Currently  Other Topics Concern   Not on file  Social History Narrative   Lives with wife and kids   Right Handed   Drinks >10 cups caffeine daily   Social Determinants of Health   Financial Resource Strain: Not on file  Food Insecurity: Not on file  Transportation Needs: Not on file  Physical Activity: Not on file  Stress: Not on file  Social Connections: Not on file  Intimate Partner Violence: Not on file    Past Surgical History:  Procedure Laterality Date   CRANIOPLASTY  Right 06/05/2020   Procedure: CRANIOPLASTY;  Surgeon: Consuella Lose, MD;  Location: [facility];  Service: Neurosurgery;  Laterality: Right;  right   CRANIOTOMY Right 07/19/2019   Procedure: RIGHT HEMI-CRANIECTOMY With implantation of skull flap to abdominal wall;  Surgeon: Consuella Lose, MD;  Location: [facility];  Service: Neurosurgery;  Laterality: Right;   CYST EXCISION N/A 10/08/2016   Procedure: EXCISION OF POSTERIOR NECK CYST;  Surgeon: Clovis Riley, MD;  [facility];  Service: General;  Laterality: N/A;   CYSTOSCOPY/URETEROSCOPY/HOLMIUM LASER/STENT PLACEMENT Right 03/16/2020   Procedure: CYSTOSCOPY RIGHT RETROGRADE PYELOGRAM URETEROSCOPY/HOLMIUM LASER/STENT PLACEMENT;  Surgeon: Lucas Mallow, MD;  [facility];  Service: Urology;  Laterality: Right;   INCISION AND DRAINAGE ABSCESS N/A 09/22/2014   Procedure: INCISION AND DRAINAGE ABSCESS POSTERIOR NECK;  Surgeon: Pedro Earls, MD;  [facility];  Service: General;  Laterality: N/A;   INCISION AND DRAINAGE ABSCESS N/A 12/20/2015   Procedure: INCISION AND DRAINAGE POSTERIOR NECK MASS;  Surgeon: Armandina Gemma, MD;  [facility];  Service: General;  Laterality: N/A;   INCISION AND DRAINAGE ABSCESS Left 07/10/2004   middle finger   IR IVC FILTER PLMT / S&I /IMG GUID/MOD SED  07/21/2019   IR RADIOLOGIST EVAL & MGMT  12/14/2019   IR RADIOLOGIST EVAL & MGMT  12/21/2020   IR VENOGRAM RENAL UNI RIGHT  07/21/2019   MASS EXCISION N/A 07/21/2017   Procedure: EXCISION OF BENIGN NECK LESION WITH LAYERED CLOSURE;  Surgeon: Irene Limbo, MD;  Location: [facility];  Service: Plastics;  Laterality: N/A;   MASS EXCISION N/A 11/10/2017   Procedure: EXCISION BENIGN LESION OF THE NECK WITH LAYERED CLOSURE;  Surgeon: Irene Limbo, MD;  Location: [facility];  Service: Plastics;  Laterality: N/A;    Family History  Problem Relation Age of Onset   Healthy Mother    Healthy Father    Clotting disorder  Maternal Grandmother    Diabetes Paternal Grandfather    Clotting disorder Maternal Aunt    Clotting disorder Maternal Uncle    Colon cancer Neg Hx     Allergies  Allergen Reactions   Hydrocodone Nausea Only    Current Outpatient Medications on File Prior to Visit  Medication Sig Dispense Refill   aspirin EC 81 MG tablet Take 1 tablet (81 mg total) by mouth daily. Swallow whole. 30 tablet 11   atorvastatin (LIPITOR) 10 MG tablet TAKE 1 TABLET BY MOUTH EVERY DAY 90 tablet 1   baclofen (LIORESAL) 10 MG tablet Take 1 tablet (10 mg total) by mouth 2 (two) times daily. 60 tablet 1   cyclobenzaprine (FLEXERIL) 5 MG tablet Take 1 tablet (5 mg total) by mouth at bedtime. 15 tablet 0   doxycycline (VIBRAMYCIN) 100 MG  capsule Take 1 capsule (100 mg total) by mouth 2 (two) times daily. 14 capsule 0   metoprolol tartrate (LOPRESSOR) 50 MG tablet Take 1 tablet (50 mg total) by mouth daily. 180 tablet 1   No current facility-administered medications on file prior to visit.    BP 118/72    Pulse 65    Temp 98.2 F (36.8 C)    Ht 5\' 11"  (1.803 m)    Wt 237 lb (107.5 kg)    SpO2 100%    BMI 33.05 kg/m       Objective:   Physical Exam  General Mental Status- Alert. General Appearance- Not in acute distress.    Skin General: Color- Normal Color. Moisture- Normal Moisture.   Neck Carotid Arteries- Normal color. Moisture- Normal Moisture. No carotid bruits. No JVD.   Chest and Lung Exam Auscultation: Breath Sounds:-Normal.   Cardiovascular Auscultation:Rythm- Regular. Murmurs & Other Heart Sounds:Auscultation of the heart reveals- No Murmurs.   Abdomen Inspection:-Inspeection Normal. Palpation/Percussion:Note:No mass. Palpation and Percussion of the abdomen reveal- Non Tender, Non Distended + BS, no rebound or guarding.   Neurologic Cranial Nerve exam:- CN III-XII intact(No nystagmus), symmetric smile. Drift Test:- No drift. Romberg Exam:- Negative.  Heal to Toe Gait  exam:-Normal. Finger to Nose:- Normal/Intact Strength:- rt upper and lower ext normal. Left upper and lower ext- 1/5 weakness. Minimal range of motion.       Assessment & Plan:   Your blood pressure is well controlled. Contiue metoprolol.   For high cholesterol continue cholesterol.    For history of stroke and left side deficits  For depression.I prescribed effexor.  For muscle spasms continue baclofen.  I did sign personal care service form today after review with patients.  Follow up 3 months or sooner if needed.  Mackie Pai, PA-C

## 2021-11-06 NOTE — Patient Instructions (Addendum)
Your blood pressure is well controlled. Contiue metoprolol.   For high cholesterol continue cholesterol.    For history of stroke and left side deficits  For depression.I prescribed effexor.  For muscle spasms continue baclofen.  I did sign personal care service form today after review with patients. Asked staff to fax in paper work.  Follow up 3 months or sooner if needed.

## 2021-11-26 ENCOUNTER — Telehealth: Payer: Self-pay

## 2021-11-26 ENCOUNTER — Telehealth: Payer: Self-pay | Admitting: Physical Medicine and Rehabilitation

## 2021-11-26 ENCOUNTER — Telehealth: Payer: Self-pay | Admitting: Medical

## 2021-11-26 DIAGNOSIS — I69391 Dysphagia following cerebral infarction: Secondary | ICD-10-CM

## 2021-11-26 DIAGNOSIS — I63411 Cerebral infarction due to embolism of right middle cerebral artery: Secondary | ICD-10-CM

## 2021-11-26 NOTE — Telephone Encounter (Signed)
Mr. Bilotti called to request a referral for Physical Therapy & Occupational Therapy. If possible he would the "[address]" location.   Call back phone [phone]. Thank you

## 2021-11-26 NOTE — Telephone Encounter (Signed)
pt would like for someone to return phone call regarding referral to rehab. please advise.

## 2021-11-26 NOTE — Telephone Encounter (Signed)
Patient would like a referral for occupational health and PT.  Are you ok with referral?  Patient seen last month.

## 2021-11-26 NOTE — Telephone Encounter (Signed)
Referral for PT/OT being requested by patient.

## 2021-11-27 ENCOUNTER — Other Ambulatory Visit: Payer: Self-pay | Admitting: Physical Medicine and Rehabilitation

## 2021-11-27 DIAGNOSIS — I639 Cerebral infarction, unspecified: Secondary | ICD-10-CM

## 2021-11-27 NOTE — Telephone Encounter (Signed)
notified

## 2021-11-27 NOTE — Telephone Encounter (Signed)
Referral dropped

## 2021-11-29 ENCOUNTER — Other Ambulatory Visit: Payer: Self-pay

## 2021-11-29 NOTE — Telephone Encounter (Signed)
Spoke with Matthew Black patient has medicaid , and a PCS form has to be signed for [place] so patient can get therapy , form placed in red folder , once form is faxed in , [name] will go out to the house and do an assessment

## 2021-11-29 NOTE — Telephone Encounter (Signed)
Referral closed for home health , patient's neuro office dropped OT and PT orders

## 2021-12-01 ENCOUNTER — Other Ambulatory Visit: Payer: Self-pay | Admitting: Medical

## 2021-12-01 ENCOUNTER — Other Ambulatory Visit: Payer: Self-pay | Admitting: Family Medicine

## 2021-12-01 ENCOUNTER — Other Ambulatory Visit: Payer: Self-pay | Admitting: Neurology

## 2021-12-01 DIAGNOSIS — S46812A Strain of other muscles, fascia and tendons at shoulder and upper arm level, left arm, initial encounter: Secondary | ICD-10-CM

## 2021-12-01 DIAGNOSIS — R252 Cramp and spasm: Secondary | ICD-10-CM

## 2021-12-02 NOTE — Telephone Encounter (Signed)
Rx refilled.

## 2021-12-02 NOTE — Telephone Encounter (Signed)
Rx baclofen sent to pt pharmacy.

## 2021-12-04 ENCOUNTER — Other Ambulatory Visit: Payer: Self-pay | Admitting: Medical

## 2021-12-06 ENCOUNTER — Ambulatory Visit: Payer: Medicaid Other | Admitting: Occupational Therapy

## 2021-12-06 ENCOUNTER — Ambulatory Visit: Payer: Medicaid Other | Attending: Medical | Admitting: Physical Therapy

## 2021-12-06 ENCOUNTER — Other Ambulatory Visit: Payer: Self-pay

## 2021-12-06 ENCOUNTER — Encounter: Payer: Self-pay | Admitting: Physical Therapy

## 2021-12-06 DIAGNOSIS — R293 Abnormal posture: Secondary | ICD-10-CM | POA: Diagnosis not present

## 2021-12-06 DIAGNOSIS — M6281 Muscle weakness (generalized): Secondary | ICD-10-CM | POA: Diagnosis not present

## 2021-12-06 DIAGNOSIS — R208 Other disturbances of skin sensation: Secondary | ICD-10-CM | POA: Insufficient documentation

## 2021-12-06 DIAGNOSIS — I69354 Hemiplegia and hemiparesis following cerebral infarction affecting left non-dominant side: Secondary | ICD-10-CM

## 2021-12-06 DIAGNOSIS — R41840 Attention and concentration deficit: Secondary | ICD-10-CM

## 2021-12-06 DIAGNOSIS — R2689 Other abnormalities of gait and mobility: Secondary | ICD-10-CM | POA: Insufficient documentation

## 2021-12-06 DIAGNOSIS — I639 Cerebral infarction, unspecified: Secondary | ICD-10-CM | POA: Diagnosis not present

## 2021-12-06 DIAGNOSIS — R29898 Other symptoms and signs involving the musculoskeletal system: Secondary | ICD-10-CM

## 2021-12-06 DIAGNOSIS — I69319 Unspecified symptoms and signs involving cognitive functions following cerebral infarction: Secondary | ICD-10-CM | POA: Diagnosis not present

## 2021-12-06 DIAGNOSIS — R2681 Unsteadiness on feet: Secondary | ICD-10-CM

## 2021-12-06 NOTE — Therapy (Signed)
[facility] [address] [place], [place], [address] Phone: [phone]   Fax:  [phone]  Physical Therapy Evaluation  Patient Details  Name: Matthew Black MRN: [id number] Date of Birth: October 23, 1976 Referring Provider (PT): Leeroy Cha   Encounter Date: 12/06/2021   PT End of Session - 12/06/21 1244     Visit Number 1    Number of Visits 1    Authorization Type [place] Medicaid Healthy [employer]    PT Start Time 1200   late arrival   PT Stop Time 1235    PT Time Calculation (min) 35 min    Equipment Utilized During Treatment Other (comment)   Refused gait belt   Activity Tolerance Patient tolerated treatment well    Behavior During Therapy [facility] for tasks assessed/performed             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   Depression    DVT (deep venous thrombosis) ([place])    BLE DVT 07/21/19, 08/02/19; s/p retrievable IVC filter 07/21/19   Hemorrhagic stroke ([place])    History of kidney stones    Hypertension    Paralysis ([place])    LEFT SIDE   PE (pulmonary thromboembolism) ([place])    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   Stroke ([place])    RICA, R A1, R MCA occlusion 07/19/19    Past Surgical History:  Procedure Laterality Date   CRANIOPLASTY Right 06/05/2020   Procedure: CRANIOPLASTY;  Surgeon: Consuella Lose, MD;  Location: [facility];  Service: Neurosurgery;  Laterality: Right;  right   CRANIOTOMY Right 07/19/2019   Procedure: RIGHT HEMI-CRANIECTOMY With implantation of skull flap to abdominal wall;  Surgeon: Consuella Lose, MD;  Location: [facility];  Service: Neurosurgery;  Laterality: Right;   CYST EXCISION N/A 10/08/2016   Procedure: EXCISION OF POSTERIOR NECK CYST;  Surgeon: Clovis Riley, MD;  [facility]: [facility];  Service: General;  Laterality: N/A;   CYSTOSCOPY/URETEROSCOPY/HOLMIUM LASER/STENT PLACEMENT Right 03/16/2020   Procedure: CYSTOSCOPY  RIGHT RETROGRADE PYELOGRAM URETEROSCOPY/HOLMIUM LASER/STENT PLACEMENT;  Surgeon: Lucas Mallow, MD;  [facility];  Service: Urology;  Laterality: Right;   INCISION AND DRAINAGE ABSCESS N/A 09/22/2014   Procedure: INCISION AND DRAINAGE ABSCESS POSTERIOR NECK;  Surgeon: Pedro Earls, MD;  [facility];  Service: General;  Laterality: N/A;   INCISION AND DRAINAGE ABSCESS N/A 12/20/2015   Procedure: INCISION AND DRAINAGE POSTERIOR NECK MASS;  Surgeon: Armandina Gemma, MD;  [facility];  Service: General;  Laterality: N/A;   INCISION AND DRAINAGE ABSCESS Left 07/10/2004   middle finger   IR IVC FILTER PLMT / S&I /IMG GUID/MOD SED  07/21/2019   IR RADIOLOGIST EVAL & MGMT  12/14/2019   IR RADIOLOGIST EVAL & MGMT  12/21/2020   IR VENOGRAM RENAL UNI RIGHT  07/21/2019   MASS EXCISION N/A 07/21/2017   Procedure: EXCISION OF BENIGN NECK LESION WITH LAYERED CLOSURE;  Surgeon: Irene Limbo, MD;  Location: [facility];  Service: Plastics;  Laterality: N/A;   MASS EXCISION N/A 11/10/2017   Procedure: EXCISION BENIGN LESION OF THE NECK WITH LAYERED CLOSURE;  Surgeon: Irene Limbo, MD;  Location: [facility];  Service: Plastics;  Laterality: N/A;    There were no vitals filed for this visit.    Subjective Assessment - 12/06/21 1201     Subjective "My doctor said that I should get HHPT to help with bathing.  They want me to go through rehab again. I told him that I can't wash my right arm but I have a brush to do my back. I can't put on my shoe or tie my shoe. The only thing I want to work on with physical therapy is that I can't open the door without using my head to push it open or close." Pt reports nothing else new or different.    How long can you sit comfortably? n/a    How long can you stand comfortably? n/a    How long can you walk comfortably? n/a    Currently in Pain? 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, unspecified cerebrovascular disease type ([place])    Referring Provider (PT) Ranell Patrick, [place]    Hand Dominance Right    Prior Therapy OPPT for CVA      Precautions   Precautions Fall      Restrictions   Weight Bearing Restrictions No      Balance Screen   Has the patient fallen in the past 6 months No      [place] residence    Living Arrangements Spouse/significant other;Children    Available Help at Discharge Family    Type of [place] entrance    [employer] Two level    Alternate Level Stairs-Number of Steps 17    Alternate Level Stairs-Rails Left      Cognition   Overall Cognitive Status Within Functional Limits for tasks assessed      Observation/Other Assessments   Focus on Therapeutic Outcomes (FOTO)  n/a      Sensation   Light Touch Appears Intact      Ambulation/Gait   Ambulation Distance (Feet) 115 Feet    Assistive device Large base quad cane   L AFO   Gait Pattern Step-to pattern;Decreased step length - left;Decreased arm swing - left;Decreased dorsiflexion - left;Abducted - left;Decreased weight shift to left;Left foot flat    Ambulation Surface Level;Indoor    Gait velocity 0.2 m/s    Stairs Yes    Stairs Assistance 4: Min guard    Stair Management Technique One rail Right;Step to pattern    Number of Stairs 4    Height of Stairs 6    Ramp 5: Supervision    Ramp Details (indicate cue type and reason) step to pattern, slowed gait                        Objective measurements completed on examination: See above findings.                PT Education - 12/06/21 1244     Education Details Reviewed opening/closing door without using his head. Reviewed stair negotiation     Person(s) Educated Patient    Methods Explanation;Demonstration;Tactile cues;Verbal cues    Comprehension Verbalized understanding;Returned demonstration;Verbal cues required                         Plan - 12/06/21 1245     Clinical Impression Statement Late arrival for eval. Matthew Black is a  46 y/o M presenting to OPPT on request by his Dr to work on ADLs such as bathing. Pt has a chronic CVA with spastic L hemiplegia. Pt is well known to this clinic and pt reports no real change in his strength or balance. He notes difficulty opening/closing doors without using his head and negotiating stairs to visit his mother. Reviewed different methods on opening the door without letting go of his cane and reviewed safe stair negotiation. Pt verbalizes understanding. Pt has no PT needs at this time - will most benefit from OT at this time. Pt's gait appears the same with a 0.2 m/s gait speed and he is able to amb similar distances as his last PT episode.    Personal Factors and Comorbidities Age    Examination-Activity Limitations Bathing;Locomotion Level;Stairs;Stand;Hygiene/Grooming;Dressing    Examination-Participation Restrictions Community Activity;Cleaning    Stability/Clinical Decision Making Stable/Uncomplicated    Clinical Decision Making Low    Rehab Potential Fair    PT Frequency One time visit    PT Next Visit Plan 1 time visit to review stair negotiation techniques and opening/closing door without using his head    Consulted and Agree with Plan of Care Patient             Patient will benefit from skilled therapeutic intervention in order to improve the following deficits and impairments:     Visit Diagnosis: 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)  Muscle weakness (generalized)  Unsteadiness on feet  Other abnormalities of gait and mobility     Problem List Patient Active Problem List   Diagnosis Date Noted    COVID-19 virus infection 04/21/2021   Left-sided weakness 04/21/2021   S/P craniotomy 06/05/2020   History of cranioplasty 06/05/2020   Peri-rectal abscess 02/14/2020   Abnormal CT scan, pelvis 02/14/2020   Pancytopenia ([place]) 12/02/2019   Nephrolithiasis 12/02/2019   Hydronephrosis with renal and ureteral calculus obstruction 12/02/2019   Rectal pain 12/02/2019   Hyperkalemia 12/02/2019   Reactive depression    Wound infection after surgery    Sleep disturbance    Dysphagia, post-stroke    Transaminitis    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   Cerebral abscess    Urinary tract infection without hematuria    Altered mental status    Primary hypercoagulable state ([place])    Acute pulmonary embolism without acute cor pulmonale (HCC)    Deep vein thrombosis (DVT) of non-extremity vein    Hypokalemia    Acute blood loss anemia    Leukocytosis    Endotracheal tube present    Acute respiratory failure with hypoxemia (HCC)    Stroke (cerebrum) ([place]) 07/19/2019   Pressure injury of skin 07/19/2019   Acute CVA (cerebrovascular accident) [facility])    Encephalopathy    Dysphagia    Acute encephalopathy    Essential hypertension    Obesity 03/12/2016   Scalp abscess 12/20/2015   Neck abscess 12/20/2015   Pilonidal cyst 02/08/2013    [facility] [address] [place], PT, DPT 12/06/2021, 12:55 PM  [facility] [address] [place], [place], [address] Phone: [phone]   Fax:  [phone]  Name: Matthew Black MRN: [id number] Date of Birth: 27-Mar-1976

## 2021-12-06 NOTE — Therapy (Signed)
[facility] [address] [place], [place], [address] Phone: [phone]   Fax:  [phone]  Occupational Therapy Evaluation  Patient Details  Name: Matthew Black MRN: [id number] Date of Birth: 1976/03/16 Referring Provider (OT): Dr. 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   Depression    DVT (deep venous thrombosis) ([place])    BLE DVT 07/21/19, 08/02/19; s/p retrievable IVC filter 07/21/19   Hemorrhagic stroke ([place])    History of kidney stones    Hypertension    Paralysis ([place])    LEFT SIDE   PE (pulmonary thromboembolism) ([place])    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   Stroke ([place])    RICA, R A1, R MCA occlusion 07/19/19    Past Surgical History:  Procedure Laterality Date   CRANIOPLASTY Right 06/05/2020   Procedure: CRANIOPLASTY;  Surgeon: Consuella Lose, MD;  Location: [facility];  Service: Neurosurgery;  Laterality: Right;  right   CRANIOTOMY Right 07/19/2019   Procedure: RIGHT HEMI-CRANIECTOMY With implantation of skull flap to abdominal wall;  Surgeon: Consuella Lose, MD;  Location: [facility];  Service: Neurosurgery;  Laterality: Right;   CYST EXCISION N/A 10/08/2016   Procedure: EXCISION OF POSTERIOR NECK CYST;  Surgeon: Clovis Riley, MD;  [facility];  Service: General;  Laterality: N/A;   CYSTOSCOPY/URETEROSCOPY/HOLMIUM LASER/STENT PLACEMENT Right 03/16/2020   Procedure: CYSTOSCOPY RIGHT RETROGRADE PYELOGRAM URETEROSCOPY/HOLMIUM LASER/STENT PLACEMENT;  Surgeon: Lucas Mallow, MD;  [facility];  Service: Urology;  Laterality:  Right;   INCISION AND DRAINAGE ABSCESS N/A 09/22/2014   Procedure: INCISION AND DRAINAGE ABSCESS POSTERIOR NECK;  Surgeon: Pedro Earls, MD;  [facility];  Service: General;  Laterality: N/A;   INCISION AND DRAINAGE ABSCESS N/A 12/20/2015   Procedure: INCISION AND DRAINAGE POSTERIOR NECK MASS;  Surgeon: Armandina Gemma, MD;  [facility];  Service: General;  Laterality: N/A;   INCISION AND DRAINAGE ABSCESS Left 07/10/2004   middle finger   IR IVC FILTER PLMT / S&I /IMG GUID/MOD SED  07/21/2019   IR RADIOLOGIST EVAL & MGMT  12/14/2019   IR RADIOLOGIST EVAL & MGMT  12/21/2020   IR VENOGRAM RENAL UNI RIGHT  07/21/2019   MASS EXCISION N/A 07/21/2017   Procedure: EXCISION OF BENIGN NECK LESION WITH LAYERED CLOSURE;  Surgeon: Irene Limbo, MD;  Location: [facility];  Service: Plastics;  Laterality: N/A;   MASS EXCISION N/A 11/10/2017   Procedure: EXCISION BENIGN LESION OF THE NECK WITH LAYERED CLOSURE;  Surgeon: Irene Limbo, MD;  Location: [facility];  Service: Plastics;  Laterality: N/A;    There were no vitals filed for this visit.   Subjective Assessment - 12/06/21 1242     Subjective  Pt reports difficulty bathing    Pertinent History RCVA s/p hemicaniectomy 07/19/19 with abdominal flap implant.  PMH: hx of seizures and headaches s/p CVA, hx of DVT with IVC filter placement . Pt s/p botox on 09/24/21.  Pt returns to occupational therapy with the following deficits: spasticity, decreased LUE functional use,    Limitations Pt reports he is driving, and was cleared by MD    Patient Stated Goals improve I with bathing, pulling on shirt, donning shoes and tying shoes    Currently in Pain? 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 Ranell Patrick    Onset Date/Surgical Date 11/27/21    Prior Therapy OPOT for CVA      Precautions   Precautions Fall      Restrictions   Weight Bearing Restrictions No      Balance Screen   Has the patient fallen in the past 6 months No    Has the patient had a decrease in activity level because of a fear of falling?  No      Home  Environment   Family/patient expects to be discharged to: Private residence    Home Access Stairs    Bathroom Shower/Tub --   sponge bathes   Lives With Spouse      Prior Function   Level of [place] Needs assistance with ADLs;Independent with community mobility with device      ADL   Eating/Feeding Needs assist with cutting food    Grooming Modified independent    Upper Body Bathing Moderate assistance    Lower Body Bathing Moderate assistance    Upper Body Dressing Minimal assistance    Lower Body Dressing Moderate assistance   needs assist with shoes and socks   Toilet Transfer Modified independent   walks into bathroom with quad cane   Tub/Shower Transfer --   takes sponge bath   ADL comments Pt reports he is driving and pt states he was cleared by MD      Mobility   Mobility Status --   mod I with quad cane     Written Expression   Dominant Hand Right      Vision - History   Baseline Vision No visual deficits   per pt.     Vision Assessment   Vision Assessment Vision not tested      Cognition   Overall Cognitive Status Cognition to be further assessed in functional context PRN    Memory Impaired    Memory Impairment Decreased short term memory    Awareness Impaired    Awareness Impairment Intellectual impairment    Cognition Comments slowed processing      Sensation   Light Touch Appears Intact    Hot/Cold Appears Intact      Coordination   Gross Motor Movements are Fluid and Coordinated No    Fine Motor Movements are Fluid and Coordinated  No    Coordination unable to move LUE      Tone   Assessment Location Left Upper Extremity      ROM / Strength   AROM / PROM / Strength PROM      PROM   Overall PROM  Deficits    Overall PROM Comments LUE shoulder flexion 105, elbow extension -89m elbow flexion 130, 80% supination, no active movement present in LUE      LUE Tone  LUE Tone Mild;Hypertonic   s/p botox                               OT Short Term Goals - 12/06/21 1349       OT SHORT TERM GOAL #1   Title Patient will complete an updated HEP to ensure management of RUE    Baseline Has prior program,  pt s/p botox- need to update program    Time 3    Period Weeks    Status New    Target Date 01/03/22      OT SHORT TERM GOAL #2   Title Pt will donn shirt modified Independently    Baseline requires min A    Time 3    Period Weeks    Status New               OT Long Term Goals - 12/06/21 1258       OT LONG TERM GOAL #1   Title Patient will complete an aquatic exercise program he can complete at his gym with support of family member    Baseline Dependent, pt's brother who was previously trained is no longer available, pt would like for his wife to be trained to assist with aquatics program    Time 6    Period Weeks    Status New    Target Date 01/24/22      OT LONG TERM GOAL #2   Title Pt/family will be I with adapted strategies  for ADLS    Baseline Pt reports continued difficulty with donning shirt, bathing , donning shoes and socks and tying shoes    Time 6    Period Weeks    Status New                            Plan - 12/06/21 1401     Clinical Impression Statement Pt s/p RCVA s/p hemicaniectomy 07/19/19 with abdominal flap implant.  PMH: hx of seizures and headaches s/p CVA, hx of DVT with IVC filter placement . Pt s/p botox on 09/24/21. Pt returns to occupational therapy with the following deficits: spasticity, decreased LUE functional use,LUE weakness,   cognitive deficits, decreased balance which impedes perfromance of ADLS/IADLS. Pt can benefit from skilled OT to address these deficits. Pt reports he is driving and was cleared by MD. Therapist has asked pt to have his wife attend therapy for increased carryover.    OT Occupational Profile and History Detailed Assessment- Review of Records and additional review of physical, cognitive, psychosocial history related to current functional performance    Occupational performance deficits (Please refer to evaluation for details): ADL's;IADL's;Leisure;Social Participation    Body Structure / Function / Physical Skills ADL;UE functional use;Balance;Flexibility;FMC;ROM;Gait;GMC;Coordination;Decreased knowledge of precautions;Decreased knowledge of use of DME;IADL;Strength;Dexterity;Mobility;Tone    Cognitive Skills Attention;Memory;Problem Solve;Safety Awareness;Thought    Rehab Potential Good    Clinical Decision Making Limited treatment options, no task modification necessary    Comorbidities Affecting Occupational Performance: May have comorbidities impacting occupational performance    Modification or Assistance to Complete Evaluation  No modification of tasks or assist necessary to complete eval    OT Frequency 1x / week   plus eval   OT Duration 6 weeks    OT Treatment/Interventions Self-care/ADL training;Ultrasound;Energy conservation;Aquatic Therapy;DME and/or AE instruction;Patient/family education;Passive range of motion;Paraffin;Cryotherapy;Fluidtherapy;Functional Mobility Training;Splinting;Therapeutic activities;Manual Therapy;Moist Heat;Cognitive remediation/compensation  Plan pt/ family ed regarding ADL strategies, updated HEP,referral to aquatic therapy,    Consulted and Agree with Plan of Care Patient             Patient will benefit from skilled therapeutic intervention in order to improve the following deficits and impairments:   Body Structure / Function / Physical Skills: ADL, UE  functional use, Balance, Flexibility, FMC, ROM, Gait, GMC, Coordination, Decreased knowledge of precautions, Decreased knowledge of use of DME, IADL, Strength, Dexterity, Mobility, Tone Cognitive Skills: Attention, Memory, Problem Solve, Safety Awareness, Thought     Visit Diagnosis: Spastic hemiplegia of left nondominant side as late effect of cerebral infarction ([place]) - Plan: Ot plan of care cert/re-cert  Muscle weakness (generalized) - Plan: Ot plan of care cert/re-cert  Unsteadiness on feet - Plan: Ot plan of care cert/re-cert  Other abnormalities of gait and mobility - Plan: Ot plan of care cert/re-cert  Abnormal posture - Plan: Ot plan of care cert/re-cert  Attention and concentration deficit - Plan: Ot plan of care cert/re-cert  Other symptoms and signs involving the musculoskeletal system - Plan: Ot plan of care cert/re-cert  Unspecified symptoms and signs involving cognitive functions following cerebral infarction - Plan: Ot plan of care cert/re-cert    Problem List Patient Active Problem List   Diagnosis Date Noted   COVID-19 virus infection 04/21/2021   Left-sided weakness 04/21/2021   S/P craniotomy 06/05/2020   History of cranioplasty 06/05/2020   Peri-rectal abscess 02/14/2020   Abnormal CT scan, pelvis 02/14/2020   Pancytopenia ([place]) 12/02/2019   Nephrolithiasis 12/02/2019   Hydronephrosis with renal and ureteral calculus obstruction 12/02/2019   Rectal pain 12/02/2019   Hyperkalemia 12/02/2019   Reactive depression    Wound infection after surgery    Sleep disturbance    Dysphagia, post-stroke    Transaminitis    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   Cerebral abscess    Urinary tract infection without hematuria    Altered mental status    Primary hypercoagulable state ([place])    Acute pulmonary embolism without acute cor pulmonale ([place])    Deep vein thrombosis (DVT) of non-extremity vein    Hypokalemia    Acute blood loss anemia     Leukocytosis    Endotracheal tube present    Acute respiratory failure with hypoxemia ([place])    Stroke (cerebrum) ([place]) 07/19/2019   Pressure injury of skin 07/19/2019   Acute CVA (cerebrovascular accident) ([place])    Encephalopathy    Dysphagia    Acute encephalopathy    Essential hypertension    Obesity 03/12/2016   Scalp abscess 12/20/2015   Neck abscess 12/20/2015   Pilonidal cyst 02/08/2013    Demetris Meinhardt, OT 12/06/2021, 2:15 PM Theone Murdoch, OTR/L [phone] Phone: [phone] 2:15 PM 12/06/21  [facility] [address] [place], [place], [address] Phone: [phone]   Fax:  [phone]  Name: BASIL BUFFIN MRN: [id number] Date of Birth: 07-Jan-1976

## 2021-12-16 ENCOUNTER — Other Ambulatory Visit: Payer: Self-pay | Admitting: Physical Medicine and Rehabilitation

## 2021-12-16 ENCOUNTER — Telehealth: Payer: Self-pay | Admitting: Physical Medicine and Rehabilitation

## 2021-12-16 DIAGNOSIS — R252 Cramp and spasm: Secondary | ICD-10-CM

## 2021-12-16 MED ORDER — BACLOFEN 10 MG PO TABS: 10.0000 mg | ORAL_TABLET | Freq: Three times a day (TID) | ORAL | 1 refills | Status: DC

## 2021-12-16 NOTE — Telephone Encounter (Signed)
Patient called today stating his shoulder is tight, wanted to move up his botox injection. Advised it must be at least 91 days apart.  Patient is seeking advise for his shoulder states it is very tight

## 2021-12-17 ENCOUNTER — Other Ambulatory Visit: Payer: Self-pay

## 2021-12-17 ENCOUNTER — Other Ambulatory Visit: Payer: Self-pay | Admitting: *Deleted

## 2021-12-17 ENCOUNTER — Ambulatory Visit: Payer: Medicaid Other | Admitting: Occupational Therapy

## 2021-12-17 ENCOUNTER — Encounter: Payer: Self-pay | Admitting: Occupational Therapy

## 2021-12-17 DIAGNOSIS — R252 Cramp and spasm: Secondary | ICD-10-CM

## 2021-12-17 DIAGNOSIS — M6281 Muscle weakness (generalized): Secondary | ICD-10-CM | POA: Diagnosis not present

## 2021-12-17 DIAGNOSIS — R41840 Attention and concentration deficit: Secondary | ICD-10-CM | POA: Diagnosis not present

## 2021-12-17 DIAGNOSIS — R2681 Unsteadiness on feet: Secondary | ICD-10-CM

## 2021-12-17 DIAGNOSIS — R208 Other disturbances of skin sensation: Secondary | ICD-10-CM

## 2021-12-17 DIAGNOSIS — R2689 Other abnormalities of gait and mobility: Secondary | ICD-10-CM | POA: Diagnosis not present

## 2021-12-17 DIAGNOSIS — I69354 Hemiplegia and hemiparesis following cerebral infarction affecting left non-dominant side: Secondary | ICD-10-CM | POA: Diagnosis not present

## 2021-12-17 DIAGNOSIS — I69319 Unspecified symptoms and signs involving cognitive functions following cerebral infarction: Secondary | ICD-10-CM | POA: Diagnosis not present

## 2021-12-17 DIAGNOSIS — R29898 Other symptoms and signs involving the musculoskeletal system: Secondary | ICD-10-CM | POA: Diagnosis not present

## 2021-12-17 DIAGNOSIS — R293 Abnormal posture: Secondary | ICD-10-CM | POA: Diagnosis not present

## 2021-12-17 DIAGNOSIS — I639 Cerebral infarction, unspecified: Secondary | ICD-10-CM | POA: Diagnosis not present

## 2021-12-17 MED ORDER — BACLOFEN 10 MG PO TABS: 10.0000 mg | ORAL_TABLET | Freq: Three times a day (TID) | ORAL | 1 refills | Status: DC

## 2021-12-17 NOTE — Telephone Encounter (Signed)
I spoke with Matthew Black and explained the increase in the baclofen (new order sent to pharmacy for #90, 10 mg tid, and instructions on using heating pad periodically on his shoulder. Do not leave on for more than 15 min each time.

## 2021-12-17 NOTE — Therapy (Signed)
[facility] [address] [place], [place], [address] Phone: [phone]   Fax:  [phone]  Occupational Therapy Treatment  Patient Details  Name: Matthew Black MRN: [id number] Date of Birth: [birth year] Referring Provider (OT): Dr. 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   Depression    DVT (deep venous thrombosis) ([place])    BLE DVT 07/21/19, 08/02/19; s/p retrievable IVC filter 07/21/19   Hemorrhagic stroke ([place])    History of kidney stones    Hypertension    Paralysis ([place])    LEFT SIDE   PE (pulmonary thromboembolism) ([place])    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   Stroke ([place])    RICA, R A1, R MCA occlusion 07/19/19    Past Surgical History:  Procedure Laterality Date   CRANIOPLASTY Right 06/05/2020   Procedure: CRANIOPLASTY;  Surgeon: Consuella Lose, MD;  Location: [facility];  Service: Neurosurgery;  Laterality: Right;  right   CRANIOTOMY Right 07/19/2019   Procedure: RIGHT HEMI-CRANIECTOMY With implantation of skull flap to abdominal wall;  Surgeon: Consuella Lose, MD;  Location: [facility];  Service: Neurosurgery;  Laterality: Right;   CYST EXCISION N/A 10/08/2016   Procedure: EXCISION OF POSTERIOR NECK CYST;  Surgeon: Clovis Riley, MD;  [facility];  Service: General;  Laterality: N/A;   CYSTOSCOPY/URETEROSCOPY/HOLMIUM LASER/STENT PLACEMENT Right 03/16/2020   Procedure: CYSTOSCOPY RIGHT RETROGRADE PYELOGRAM  URETEROSCOPY/HOLMIUM LASER/STENT PLACEMENT;  Surgeon: Lucas Mallow, MD;  [facility];  Service: Urology;  Laterality: Right;   INCISION AND DRAINAGE ABSCESS N/A 09/22/2014   Procedure: INCISION AND DRAINAGE ABSCESS POSTERIOR NECK;  Surgeon: Pedro Earls, MD;  [facility];  Service: General;  Laterality: N/A;   INCISION AND DRAINAGE ABSCESS N/A 12/20/2015   Procedure: INCISION AND DRAINAGE POSTERIOR NECK MASS;  Surgeon: Armandina Gemma, MD;  [facility];  Service: General;  Laterality: N/A;   INCISION AND DRAINAGE ABSCESS Left 07/10/2004   middle finger   IR IVC FILTER PLMT / S&I /IMG GUID/MOD SED  07/21/2019   IR RADIOLOGIST EVAL & MGMT  12/14/2019   IR RADIOLOGIST EVAL & MGMT  12/21/2020   IR VENOGRAM RENAL UNI RIGHT  07/21/2019   MASS EXCISION N/A 07/21/2017   Procedure: EXCISION OF BENIGN NECK LESION WITH LAYERED CLOSURE;  Surgeon: Irene Limbo, MD;  Location: [facility];  Service: Plastics;  Laterality: N/A;   MASS EXCISION N/A 11/10/2017   Procedure: EXCISION BENIGN LESION OF THE NECK WITH LAYERED CLOSURE;  Surgeon: Irene Limbo, MD;  Location: [facility];  Service: Plastics;  Laterality: N/A;    There were no vitals filed for this visit.   Subjective Assessment - 12/17/21 1327     Subjective  It gets stuck on my elbow.  (regarding putting on tshrt)    Pertinent History RCVA s/p hemicaniectomy 07/19/19  with abdominal flap implant.  PMH: hx of seizures and headaches s/p CVA, hx of DVT with IVC filter placement . Pt s/p botox on 09/24/21. Pt returns to occupational therapy with the following deficits: spasticity, decreased LUE functional use,    Limitations Pt reports he is driving, and was cleared by MD    Patient Stated Goals improve I with bathing, pulling on shirt, donning shoes and tying shoes    Currently in Pain? No/denies                          OT Treatments/Exercises (OP) - 12/17/21 0001       ADLs    ADL Comments Reviewed OT goals, and scheduled patient for pool session next week.      Neurological Re-education Exercises   Other Exercises 1 Patient showing exercises he has been doing to get his left arm moving.  Patient has been holding breath, closing eyes, bearing down and activating shoulder adductors/internal rotators.  Patient has recently called MD to discuss options for increased tension in shoulder.  Patient recently had increased prescription for Baclofen.  Discussed with patient that he was actively overworking same muscles leading to shoulder tightness.  Discussed working arm in a new way for more volitional control.  Worked on shoulder flexion with elbow extesnion, and shoulder extension with elbow flexion - working to reduce effort and to breathe through the movement.  Patient actually able to move in both directions with increased attention to the movement.                      OT Short Term Goals - 12/17/21 1341       OT SHORT TERM GOAL #1   Title Patient will complete an updated HEP to ensure management of RUE    Baseline Has prior program,  pt s/p botox- need to update program    Time 3    Period Weeks    Status On-going    Target Date 01/03/22      OT SHORT TERM GOAL #2   Title Pt will donn shirt modified Independently    Baseline requires min A    Time 3    Period Weeks    Status On-going      OT SHORT TERM GOAL #3   Status On-going      OT SHORT TERM GOAL #4   Status On-going               OT Long Term Goals - 12/17/21 1343       OT LONG TERM GOAL #1   Title Patient will complete an aquatic exercise program he can complete at his gym with support of family member    Baseline Dependent, pt's bother who was preveiously trained is no longer available, pt would like for his wife to be trained to assist with aquatics program    Time 6    Period Weeks    Status On-going    Target Date 01/24/22      OT LONG TERM GOAL #2   Title Pt/family  will be I with adapted strategies  for ADLS    Baseline Pt reports continued difficulty with donning shirt, bathing , donning shoes and socks and tying shoes    Time 6    Period Weeks    Status On-going      OT LONG TERM GOAL #3   Status On-going  OT LONG TERM GOAL #4   Status On-going                   Plan - 12/17/21 1336     Clinical Impression Statement Pt in agreement with OT goals, and plan.  Patient will do a hybrid program of clinic and pool for OT.    OT Occupational Profile and History Detailed Assessment- Review of Records and additional review of physical, cognitive, psychosocial history related to current functional performance    Occupational performance deficits (Please refer to evaluation for details): ADL's;IADL's;Leisure;Social Participation    Body Structure / Function / Physical Skills ADL;UE functional use;Balance;Flexibility;FMC;ROM;Gait;GMC;Coordination;Decreased knowledge of precautions;Decreased knowledge of use of DME;IADL;Strength;Dexterity;Mobility;Tone    Cognitive Skills Attention;Memory;Problem Solve;Safety Awareness;Thought    Rehab Potential Good    Clinical Decision Making Limited treatment options, no task modification necessary    Comorbidities Affecting Occupational Performance: May have comorbidities impacting occupational performance    Modification or Assistance to Complete Evaluation  No modification of tasks or assist necessary to complete eval    OT Frequency 1x / week   plus eval   OT Duration 6 weeks    OT Treatment/Interventions Self-care/ADL training;Ultrasound;Energy conservation;Aquatic Therapy;DME and/or AE instruction;Patient/family education;Passive range of motion;Paraffin;Cryotherapy;Fluidtherapy;Functional Mobility Training;Splinting;Therapeutic activities;Manual Therapy;Moist Heat;Cognitive remediation/compensation    Plan pt/ family ed regarding ADL strategies, updated HEP,referral to aquatic therapy,    Consulted and  Agree with Plan of Care Patient             Patient will benefit from skilled therapeutic intervention in order to improve the following deficits and impairments:   Body Structure / Function / Physical Skills: ADL, UE functional use, Balance, Flexibility, FMC, ROM, Gait, GMC, Coordination, Decreased knowledge of precautions, Decreased knowledge of use of DME, IADL, Strength, Dexterity, Mobility, Tone Cognitive Skills: Attention, Memory, Problem Solve, Safety Awareness, Thought     Visit Diagnosis: 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)  Muscle weakness (generalized)  Unsteadiness on feet  Abnormal posture  Attention and concentration deficit  Other disturbances of skin sensation    Problem List Patient Active Problem List   Diagnosis Date Noted   COVID-19 virus infection 04/21/2021   Left-sided weakness 04/21/2021   S/P craniotomy 06/05/2020   History of cranioplasty 06/05/2020   Peri-rectal abscess 02/14/2020   Abnormal CT scan, pelvis 02/14/2020   Pancytopenia ([place]) 12/02/2019   Nephrolithiasis 12/02/2019   Hydronephrosis with renal and ureteral calculus obstruction 12/02/2019   Rectal pain 12/02/2019   Hyperkalemia 12/02/2019   Reactive depression    Wound infection after surgery    Sleep disturbance    Dysphagia, post-stroke    Transaminitis    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   Cerebral abscess    Urinary tract infection without hematuria    Altered mental status    Primary hypercoagulable state ([place])    Acute pulmonary embolism without acute cor pulmonale (HCC)    Deep vein thrombosis (DVT) of non-extremity vein    Hypokalemia    Acute blood loss anemia    Leukocytosis    Endotracheal tube present    Acute respiratory failure with hypoxemia (HCC)    Stroke (cerebrum) ([place]) 07/19/2019   Pressure injury of skin 07/19/2019   Acute CVA (cerebrovascular accident) ([place])     Encephalopathy    Dysphagia    Acute encephalopathy    Essential hypertension    Obesity 03/12/2016   Scalp abscess 12/20/2015   Neck  abscess 12/20/2015   Pilonidal cyst 02/08/2013    Mariah Milling, OT 12/17/2021, 1:45 PM  [facility] [address] [place], [place], [address] Phone: [phone]   Fax:  [phone]  Name: ERIKA HUSSAR MRN: [id number] Date of Birth: 05-Mar-1976

## 2021-12-23 ENCOUNTER — Ambulatory Visit: Payer: Medicaid Other | Admitting: Occupational Therapy

## 2021-12-23 ENCOUNTER — Other Ambulatory Visit: Payer: Self-pay

## 2021-12-23 ENCOUNTER — Encounter: Payer: Self-pay | Admitting: Occupational Therapy

## 2021-12-23 DIAGNOSIS — R208 Other disturbances of skin sensation: Secondary | ICD-10-CM

## 2021-12-23 DIAGNOSIS — R41840 Attention and concentration deficit: Secondary | ICD-10-CM

## 2021-12-23 DIAGNOSIS — I69354 Hemiplegia and hemiparesis following cerebral infarction affecting left non-dominant side: Secondary | ICD-10-CM

## 2021-12-23 DIAGNOSIS — M6281 Muscle weakness (generalized): Secondary | ICD-10-CM

## 2021-12-23 DIAGNOSIS — R293 Abnormal posture: Secondary | ICD-10-CM

## 2021-12-23 DIAGNOSIS — R2681 Unsteadiness on feet: Secondary | ICD-10-CM

## 2021-12-23 NOTE — Therapy (Signed)
[facility] [address] [place], [place], [address] Phone: [phone]   Fax:  [phone]  Occupational Therapy Treatment  Patient Details  Name: Matthew Black MRN: [id number] Date of Birth: February 23, 1976 Referring Provider (OT): Dr. 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   Depression    DVT (deep venous thrombosis) ([place])    BLE DVT 07/21/19, 08/02/19; s/p retrievable IVC filter 07/21/19   Hemorrhagic stroke ([place])    History of kidney stones    Hypertension    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;  Service: Neurosurgery;  Laterality: Right;   CYST EXCISION N/A 10/08/2016   Procedure: EXCISION OF POSTERIOR NECK CYST;  Surgeon: Clovis Riley, MD;  [facility];  Service: General;  Laterality: N/A;   CYSTOSCOPY/URETEROSCOPY/HOLMIUM LASER/STENT PLACEMENT Right 03/16/2020   Procedure: CYSTOSCOPY RIGHT RETROGRADE PYELOGRAM  URETEROSCOPY/HOLMIUM LASER/STENT PLACEMENT;  Surgeon: Lucas Mallow, MD;  [facility];  Service: Urology;  Laterality: Right;   INCISION AND DRAINAGE ABSCESS N/A 09/22/2014   Procedure: INCISION AND DRAINAGE ABSCESS POSTERIOR NECK;  Surgeon: Pedro Earls, MD;  [facility];  Service: General;  Laterality: N/A;   INCISION AND DRAINAGE ABSCESS N/A 12/20/2015   Procedure: INCISION AND DRAINAGE POSTERIOR NECK MASS;  Surgeon: Armandina Gemma, MD;  [facility];  Service: General;  Laterality: N/A;   INCISION AND DRAINAGE ABSCESS Left 07/10/2004   middle finger   IR IVC FILTER PLMT / S&I /IMG GUID/MOD SED  07/21/2019   IR RADIOLOGIST EVAL & MGMT  12/14/2019   IR RADIOLOGIST EVAL & MGMT  12/21/2020   IR VENOGRAM RENAL UNI RIGHT  07/21/2019   MASS EXCISION N/A 07/21/2017   Procedure: EXCISION OF BENIGN NECK LESION WITH LAYERED CLOSURE;  Surgeon: Irene Limbo, MD;  Location: [facility];  Service: Plastics;  Laterality: N/A;   MASS EXCISION N/A 11/10/2017   Procedure: EXCISION BENIGN LESION OF THE NECK WITH LAYERED CLOSURE;  Surgeon: Irene Limbo, MD;  Location: [facility];  Service: Plastics;  Laterality: N/A;    There were no vitals filed for this visit.   Subjective Assessment - 12/23/21 1750     Subjective  I took a fall - slipped in the mud.  My neighbor had to get me up.  Pertinent History RCVA s/p hemicraniectomy 07/19/19 with abdominal flap implant.  PMH: hx of seizures and headaches s/p CVA, hx of DVT with IVC filter placement . Pt s/p botox on 09/24/21. Pt returns to occupational therapy with the following deficits: spasticity, decreased LUE functional use,    Patient Stated Goals improve I with bathing, pulling on shirt, donning shoes and tying shoes    Currently in Pain? Yes    Pain Score --   unable to score - "aches"   Pain Location Shoulder    Pain Orientation Left    Pain Descriptors / Indicators Aching             Patient  seen for aquatic therapy visit today.  Patient entered the pool via chair lift and exited the pool via stairs and mod assistance.  Patient seen in 3.5-4.5 ft of water, water temperature 84 degrees.  Patient reported discomfort with cooler water, stating - "this doesn't feel good."   Patient unable to wear AFO in the pool, so he used an aircast to help support and align his left ankle.  Patient worked initially with buoyancy principles to allow left arm to float to surface.  Working to decrease excessive effort with arm movement, and patient doing really well with this - allowing time for arm to respond versus over recruiting.   Worked on alignment in standing and squatting - emphasis on weight bearing on LLE, and shifting weight toward left side.  Best result with walking in chest deep water - initally holding wall of pool, then with therapist in front - Holding onto therapist, then without UE support.  Patient very surface dependent with RUE, and fearful of tasks that do not allow for RUE support.  Patient with improved ability to take steps in chest deep water after practice.   Patient responded best with more buoyancy support for allowing him to take more weight through LLE, but needed intermittent assistance due to turbulence in deeper water.                         OT Short Term Goals - 12/23/21 1755       OT SHORT TERM GOAL #1   Title Patient will complete an updated HEP to ensure management of RUE    Baseline Has prior program,  pt s/p botox- need to update program    Time 3    Period Weeks    Status On-going    Target Date 01/03/22      OT SHORT TERM GOAL #2   Title Pt will donn shirt modified Independently    Baseline requires min A    Time 3    Period Weeks    Status On-going      OT SHORT TERM GOAL #3   Status On-going      OT SHORT TERM GOAL #4   Status On-going               OT Long Term Goals - 12/17/21 1343       OT LONG TERM GOAL #1   Title  Patient will complete an aquatic exercise program he can complete at his gym with support of family member    Baseline Dependent, pt's bother who was preveiously trained is no longer available, pt would like for his wife to be trained to assist with aquatics program    Time 6    Period Weeks    Status On-going  Target Date 01/24/22      OT LONG TERM GOAL #2   Title Pt/family will be I with adapted strategies  for ADLS    Baseline Pt reports continued difficulty with donning shirt, bathing , donning shoes and socks and tying shoes    Time 6    Period Weeks    Status On-going      OT LONG TERM GOAL #3   Status On-going      OT LONG TERM GOAL #4   Status On-going                   Plan - 12/23/21 1752     Clinical Impression Statement Pt with increased tension in left limbs, patient typically responds well to warm water, today water temperature cooler, and patient needed increased time to allow body to accomodate.    OT Occupational Profile and History Detailed Assessment- Review of Records and additional review of physical, cognitive, psychosocial history related to current functional performance    Occupational performance deficits (Please refer to evaluation for details): ADL's;IADL's;Leisure;Social Participation    Body Structure / Function / Physical Skills ADL;UE functional use;Balance;Flexibility;FMC;ROM;Gait;GMC;Coordination;Decreased knowledge of precautions;Decreased knowledge of use of DME;IADL;Strength;Dexterity;Mobility;Tone    Cognitive Skills Attention;Memory;Problem Solve;Safety Awareness;Thought    Rehab Potential Good    Clinical Decision Making Limited treatment options, no task modification necessary    Comorbidities Affecting Occupational Performance: May have comorbidities impacting occupational performance    Modification or Assistance to Complete Evaluation  No modification of tasks or assist necessary to complete eval    OT Frequency 1x / week   plus  eval   OT Duration 6 weeks    OT Treatment/Interventions Self-care/ADL training;Ultrasound;Energy conservation;Aquatic Therapy;DME and/or AE instruction;Patient/family education;Passive range of motion;Paraffin;Cryotherapy;Fluidtherapy;Functional Mobility Training;Splinting;Therapeutic activities;Manual Therapy;Moist Heat;Cognitive remediation/compensation    Plan pt/ family ed regarding ADL strategies, updated HEP,aquatic therapy - for left sided activation, balance, and UE stretch/active movement,    Consulted and Agree with Plan of Care Patient             Patient will benefit from skilled therapeutic intervention in order to improve the following deficits and impairments:   Body Structure / Function / Physical Skills: ADL, UE functional use, Balance, Flexibility, FMC, ROM, Gait, GMC, Coordination, Decreased knowledge of precautions, Decreased knowledge of use of DME, IADL, Strength, Dexterity, Mobility, Tone Cognitive Skills: Attention, Memory, Problem Solve, Safety Awareness, Thought     Visit Diagnosis: 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)  Muscle weakness (generalized)  Unsteadiness on feet  Abnormal posture  Attention and concentration deficit  Other disturbances of skin sensation    Problem List Patient Active Problem List   Diagnosis Date Noted   COVID-19 virus infection 04/21/2021   Left-sided weakness 04/21/2021   S/P craniotomy 06/05/2020   History of cranioplasty 06/05/2020   Peri-rectal abscess 02/14/2020   Abnormal CT scan, pelvis 02/14/2020   Pancytopenia ([place]) 12/02/2019   Nephrolithiasis 12/02/2019   Hydronephrosis with renal and ureteral calculus obstruction 12/02/2019   Rectal pain 12/02/2019   Hyperkalemia 12/02/2019   Reactive depression    Wound infection after surgery    Sleep disturbance    Dysphagia, post-stroke    Transaminitis    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    Cerebral abscess    Urinary tract infection without hematuria    Altered mental status    Primary hypercoagulable state ([place])    Acute pulmonary embolism without acute  cor pulmonale (HCC)    Deep vein thrombosis (DVT) of non-extremity vein    Hypokalemia    Acute blood loss anemia    Leukocytosis    Endotracheal tube present    Acute respiratory failure with hypoxemia (HCC)    Stroke (cerebrum) ([place]) 07/19/2019   Pressure injury of skin 07/19/2019   Acute CVA (cerebrovascular accident) [facility])    Encephalopathy    Dysphagia    Acute encephalopathy    Essential hypertension    Obesity 03/12/2016   Scalp abscess 12/20/2015   Neck abscess 12/20/2015   Pilonidal cyst 02/08/2013    Mariah Milling, OT 12/23/2021, 6:02 PM  [facility] [address] [place], [place], [address] Phone: [phone]   Fax:  [phone]  Name: Matthew Black MRN: [id number] Date of Birth: 1976-09-03

## 2021-12-24 ENCOUNTER — Encounter: Payer: Medicaid Other | Admitting: Occupational Therapy

## 2021-12-31 ENCOUNTER — Other Ambulatory Visit: Payer: Self-pay

## 2021-12-31 ENCOUNTER — Encounter: Payer: Self-pay | Admitting: Occupational Therapy

## 2021-12-31 ENCOUNTER — Ambulatory Visit: Payer: Medicaid Other | Attending: Medical | Admitting: Occupational Therapy

## 2021-12-31 DIAGNOSIS — R41840 Attention and concentration deficit: Secondary | ICD-10-CM

## 2021-12-31 DIAGNOSIS — M6281 Muscle weakness (generalized): Secondary | ICD-10-CM

## 2021-12-31 DIAGNOSIS — R2689 Other abnormalities of gait and mobility: Secondary | ICD-10-CM | POA: Insufficient documentation

## 2021-12-31 DIAGNOSIS — R2681 Unsteadiness on feet: Secondary | ICD-10-CM | POA: Diagnosis not present

## 2021-12-31 DIAGNOSIS — R208 Other disturbances of skin sensation: Secondary | ICD-10-CM

## 2021-12-31 DIAGNOSIS — I69354 Hemiplegia and hemiparesis following cerebral infarction affecting left non-dominant side: Secondary | ICD-10-CM | POA: Diagnosis not present

## 2021-12-31 DIAGNOSIS — M25612 Stiffness of left shoulder, not elsewhere classified: Secondary | ICD-10-CM

## 2021-12-31 DIAGNOSIS — R293 Abnormal posture: Secondary | ICD-10-CM | POA: Insufficient documentation

## 2021-12-31 DIAGNOSIS — R29898 Other symptoms and signs involving the musculoskeletal system: Secondary | ICD-10-CM | POA: Insufficient documentation

## 2021-12-31 NOTE — Therapy (Signed)
[facility] [address] [place], [place], [address] Phone: [phone]   Fax:  [phone]  Occupational Therapy Treatment  Patient Details  Name: Matthew Black MRN: [id number] Date of Birth: Feb 09, 1976 Referring Provider (OT): Dr. 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   Depression    DVT (deep venous thrombosis) ([place])    BLE DVT 07/21/19, 08/02/19; s/p retrievable IVC filter 07/21/19   Hemorrhagic stroke ([place])    History of kidney stones    Hypertension    Paralysis ([place])    LEFT SIDE   PE (pulmonary thromboembolism) ([place])    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   Stroke ([place])    RICA, R A1, R MCA occlusion 07/19/19    Past Surgical History:  Procedure Laterality Date   CRANIOPLASTY Right 06/05/2020   Procedure: CRANIOPLASTY;  Surgeon: Consuella Lose, MD;  Location: [facility];  Service: Neurosurgery;  Laterality: Right;  right   CRANIOTOMY Right 07/19/2019   Procedure: RIGHT HEMI-CRANIECTOMY With implantation of skull flap to abdominal wall;  Surgeon: Consuella Lose, MD;  Location: [facility];  Service: Neurosurgery;  Laterality: Right;   CYST EXCISION N/A 10/08/2016   Procedure: EXCISION OF POSTERIOR NECK CYST;  Surgeon: Clovis Riley, MD;  Location: [facility];  Service: General;  Laterality: N/A;   CYSTOSCOPY/URETEROSCOPY/HOLMIUM LASER/STENT PLACEMENT Right 03/16/2020   Procedure: CYSTOSCOPY RIGHT RETROGRADE PYELOGRAM  URETEROSCOPY/HOLMIUM LASER/STENT PLACEMENT;  Surgeon: Lucas Mallow, MD;  [facility];  Service: Urology;  Laterality: Right;   INCISION AND DRAINAGE ABSCESS N/A 09/22/2014   Procedure: INCISION AND DRAINAGE ABSCESS POSTERIOR NECK;  Surgeon: Pedro Earls, MD;  [facility];  Service: General;  Laterality: N/A;   INCISION AND DRAINAGE ABSCESS N/A 12/20/2015   Procedure: INCISION AND DRAINAGE POSTERIOR NECK MASS;  Surgeon: Armandina Gemma, MD;  [facility];  Service: General;  Laterality: N/A;   INCISION AND DRAINAGE ABSCESS Left 07/10/2004   middle finger   IR IVC FILTER PLMT / S&I /IMG GUID/MOD SED  07/21/2019   IR RADIOLOGIST EVAL & MGMT  12/14/2019   IR RADIOLOGIST EVAL & MGMT  12/21/2020   IR VENOGRAM RENAL UNI RIGHT  07/21/2019   MASS EXCISION N/A 07/21/2017   Procedure: EXCISION OF BENIGN NECK LESION WITH LAYERED CLOSURE;  Surgeon: Irene Limbo, MD;  Location: [facility];  Service: Plastics;  Laterality: N/A;   MASS EXCISION N/A 11/10/2017   Procedure: EXCISION BENIGN LESION OF THE NECK WITH LAYERED CLOSURE;  Surgeon: Irene Limbo, MD;  Location: [facility];  Service: Plastics;  Laterality: N/A;    There were no vitals filed for this visit.   Subjective Assessment - 12/31/21 1146     Subjective  "she still in [place]? I told her to call me i'm afriad she's gonna get bit by a snake. I've  ben sleeping with my phone on my pillow"    Pertinent History RCVA s/p hemicraniectomy 07/19/19 with abdominal flap implant.  PMH: hx of seizures and headaches s/p CVA, hx of DVT with IVC filter placement . Pt s/p botox on 09/24/21. Pt returns to occupational therapy with the following deficits: spasticity, decreased LUE functional use,    Patient Stated Goals improve I with bathing, pulling on shirt, donning shoes and tying shoes    Currently in Pain? No/denies    Pain Score 0-No pain                          OT Treatments/Exercises  (OP) - 12/31/21 0001       ADLs   UB Dressing pt reports putting it on independently today but some days needs about 25% assistance. practiced donning polo shirt today - pt req'd increased time and min A for pulling shirt over L shoulder.    Toileting pt reports taking himself to the bathroom and managing clothing independently      Neurological Re-education Exercises   Other Exercises 1 self PROM to floor x 12 reps                      OT Short Term Goals - 12/23/21 1755       OT SHORT TERM GOAL #1   Title Patient will complete an updated HEP to ensure management of RUE    Baseline Has prior program,  pt s/p botox- need to update program    Time 3    Period Weeks    Status On-going    Target Date 01/03/22      OT SHORT TERM GOAL #2   Title Pt will donn shirt modified Independently    Baseline requires min A    Time 3    Period Weeks    Status On-going      OT SHORT TERM GOAL #3   Status On-going      OT SHORT TERM GOAL #4   Status On-going               OT Long Term Goals - 12/17/21 1343       OT LONG TERM GOAL #1   Title Patient will complete an aquatic exercise program he can complete at his gym with support of family member    Baseline Dependent, pt's bother who was preveiously trained is no longer available, pt would like for his wife to be trained to assist with aquatics program    Time 6    Period Weeks    Status On-going    Target Date 01/24/22      OT LONG TERM GOAL #2   Title Pt/family will be I with adapted strategies  for ADLS    Baseline Pt reports continued difficulty with donning shirt, bathing , donning shoes and socks and tying shoes    Time 6    Period Weeks    Status On-going      OT LONG TERM GOAL #3   Status On-going      OT LONG TERM GOAL #4   Status On-going                   Plan - 12/31/21 1215     Clinical Impression Statement Pt reports his prefab resting hand splint from hospital is coming off now  - OT suggested bringing it into clinic to assess and  posibly making custom resting hand splint if needed.    OT Occupational Profile and History Detailed Assessment- Review of Records and additional review of physical, cognitive, psychosocial history related to current functional performance    Occupational performance deficits (Please refer to evaluation for details): ADL's;IADL's;Leisure;Social Participation    Body Structure / Function / Physical Skills ADL;UE functional use;Balance;Flexibility;FMC;ROM;Gait;GMC;Coordination;Decreased knowledge of precautions;Decreased knowledge of use of DME;IADL;Strength;Dexterity;Mobility;Tone    Cognitive Skills Attention;Memory;Problem Solve;Safety Awareness;Thought    Rehab Potential Good    Clinical Decision Making Limited treatment options, no task modification necessary    Comorbidities Affecting Occupational Performance: May have comorbidities impacting occupational performance    Modification or Assistance to Complete Evaluation  No modification of tasks or assist necessary to complete eval    OT Frequency 1x / week   plus eval   OT Duration 6 weeks    OT Treatment/Interventions Self-care/ADL training;Ultrasound;Energy conservation;Aquatic Therapy;DME and/or AE instruction;Patient/family education;Passive range of motion;Paraffin;Cryotherapy;Fluidtherapy;Functional Mobility Training;Splinting;Therapeutic activities;Manual Therapy;Moist Heat;Cognitive remediation/compensation    Plan pt/ family ed regarding ADL strategies, updated HEP,aquatic therapy - for left sided activation, balance, and UE stretch/active movement,    Consulted and Agree with Plan of Care Patient             Patient will benefit from skilled therapeutic intervention in order to improve the following deficits and impairments:   Body Structure / Function / Physical Skills: ADL, UE functional use, Balance, Flexibility, FMC, ROM, Gait, GMC, Coordination, Decreased knowledge of  precautions, Decreased knowledge of use of DME, IADL, Strength, Dexterity, Mobility, Tone Cognitive Skills: Attention, Memory, Problem Solve, Safety Awareness, Thought     Visit Diagnosis: 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)  Muscle weakness (generalized)  Unsteadiness on feet  Attention and concentration deficit  Other disturbances of skin sensation  Stiffness of left shoulder, not elsewhere classified    Problem List Patient Active Problem List   Diagnosis Date Noted   COVID-19 virus infection 04/21/2021   Left-sided weakness 04/21/2021   S/P craniotomy 06/05/2020   History of cranioplasty 06/05/2020   Peri-rectal abscess 02/14/2020   Abnormal CT scan, pelvis 02/14/2020   Pancytopenia ([place]) 12/02/2019   Nephrolithiasis 12/02/2019   Hydronephrosis with renal and ureteral calculus obstruction 12/02/2019   Rectal pain 12/02/2019   Hyperkalemia 12/02/2019   Reactive depression    Wound infection after surgery    Sleep disturbance    Dysphagia, post-stroke    Transaminitis    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   Cerebral abscess    Urinary tract infection without hematuria    Altered mental status    Primary hypercoagulable state ([place])    Acute pulmonary embolism without acute cor pulmonale (HCC)    Deep vein thrombosis (DVT) of non-extremity vein    Hypokalemia    Acute blood loss anemia    Leukocytosis    Endotracheal tube present    Acute respiratory failure with hypoxemia (HCC)    Stroke (cerebrum) ([place]) 07/19/2019   Pressure injury of skin 07/19/2019   Acute CVA (cerebrovascular accident) ([place])    Encephalopathy    Dysphagia    Acute encephalopathy    Essential hypertension    Obesity 03/12/2016   Scalp abscess 12/20/2015   Neck abscess 12/20/2015   Pilonidal cyst 02/08/2013    Zachery Conch, OT 12/31/2021, 1:12 PM  [facility] [address] [place], [place], [address] Phone: [phone]   Fax:  [place]  Name: Matthew Black MRN: [id number] Date of Birth: 11-Oct-1976

## 2022-01-04 ENCOUNTER — Other Ambulatory Visit: Payer: Self-pay | Admitting: Family Medicine

## 2022-01-04 DIAGNOSIS — S46812A Strain of other muscles, fascia and tendons at shoulder and upper arm level, left arm, initial encounter: Secondary | ICD-10-CM

## 2022-01-08 ENCOUNTER — Other Ambulatory Visit: Payer: Self-pay | Admitting: Physical Medicine and Rehabilitation

## 2022-01-08 ENCOUNTER — Other Ambulatory Visit: Payer: Self-pay

## 2022-01-08 ENCOUNTER — Ambulatory Visit: Payer: Medicaid Other | Admitting: Occupational Therapy

## 2022-01-08 DIAGNOSIS — R29898 Other symptoms and signs involving the musculoskeletal system: Secondary | ICD-10-CM

## 2022-01-08 DIAGNOSIS — R2689 Other abnormalities of gait and mobility: Secondary | ICD-10-CM

## 2022-01-08 DIAGNOSIS — R2681 Unsteadiness on feet: Secondary | ICD-10-CM

## 2022-01-08 DIAGNOSIS — I69354 Hemiplegia and hemiparesis following cerebral infarction affecting left non-dominant side: Secondary | ICD-10-CM

## 2022-01-08 DIAGNOSIS — R41840 Attention and concentration deficit: Secondary | ICD-10-CM | POA: Diagnosis not present

## 2022-01-08 DIAGNOSIS — M25612 Stiffness of left shoulder, not elsewhere classified: Secondary | ICD-10-CM | POA: Diagnosis not present

## 2022-01-08 DIAGNOSIS — R252 Cramp and spasm: Secondary | ICD-10-CM

## 2022-01-08 DIAGNOSIS — M6281 Muscle weakness (generalized): Secondary | ICD-10-CM | POA: Diagnosis not present

## 2022-01-08 DIAGNOSIS — R208 Other disturbances of skin sensation: Secondary | ICD-10-CM

## 2022-01-08 DIAGNOSIS — R293 Abnormal posture: Secondary | ICD-10-CM

## 2022-01-08 NOTE — Telephone Encounter (Signed)
Pharmacy is requesting 90 day supply.

## 2022-01-09 NOTE — Therapy (Signed)
[facility] [address] [place], [place], [address] Phone: [phone]   Fax:  [phone]  Occupational Therapy Treatment  Patient Details  Name: Matthew Black MRN: [id number] Date of Birth: 25-Jul-1976 Referring Provider (OT): Dr. 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   Depression    DVT (deep venous thrombosis) ([place])    BLE DVT 07/21/19, 08/02/19; s/p retrievable IVC filter 07/21/19   Hemorrhagic stroke ([place])    History of kidney stones    Hypertension    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   PE (pulmonary thromboembolism) ([place])    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   Stroke ([place])    RICA, R A1, R MCA occlusion 07/19/19    Past Surgical History:  Procedure Laterality Date   CRANIOPLASTY Right 06/05/2020   Procedure: CRANIOPLASTY;  Surgeon: Consuella Lose, MD;  Location: [facility];  Service: Neurosurgery;  Laterality: Right;  right   CRANIOTOMY Right 07/19/2019   Procedure: RIGHT HEMI-CRANIECTOMY With implantation of skull flap to abdominal wall;  Surgeon: Consuella Lose, MD;  Location: [facility];  Service: Neurosurgery;  Laterality: Right;   CYST EXCISION N/A 10/08/2016   Procedure: EXCISION OF POSTERIOR NECK CYST;  Surgeon: Clovis Riley, MD;  [facility];  Service: General;  Laterality: N/A;   CYSTOSCOPY/URETEROSCOPY/HOLMIUM LASER/STENT PLACEMENT Right 03/16/2020   Procedure: CYSTOSCOPY RIGHT RETROGRADE PYELOGRAM  URETEROSCOPY/HOLMIUM LASER/STENT PLACEMENT;  Surgeon: Lucas Mallow, MD;  [facility];  Service: Urology;  Laterality: Right;   INCISION AND DRAINAGE ABSCESS N/A 09/22/2014   Procedure: INCISION AND DRAINAGE ABSCESS POSTERIOR NECK;  Surgeon: Pedro Earls, MD;  [facility];  Service: General;  Laterality: N/A;   INCISION AND DRAINAGE ABSCESS N/A 12/20/2015   Procedure: INCISION AND DRAINAGE POSTERIOR NECK MASS;  Surgeon: Armandina Gemma, MD;  [facility];  Service: General;  Laterality: N/A;   INCISION AND DRAINAGE ABSCESS Left 07/10/2004   middle finger   IR IVC FILTER PLMT / S&I /IMG GUID/MOD SED  07/21/2019   IR RADIOLOGIST EVAL & MGMT  12/14/2019   IR RADIOLOGIST EVAL & MGMT  12/21/2020   IR VENOGRAM RENAL UNI RIGHT  07/21/2019   MASS EXCISION N/A 07/21/2017   Procedure: EXCISION OF BENIGN NECK LESION WITH LAYERED CLOSURE;  Surgeon: Irene Limbo, MD;  Location: [facility];  Service: Plastics;  Laterality: N/A;   MASS EXCISION N/A 11/10/2017   Procedure: EXCISION BENIGN LESION OF THE NECK WITH LAYERED CLOSURE;  Surgeon: Irene Limbo, MD;  Location: [facility];  Service: Plastics;  Laterality: N/A;    There were no vitals filed for this visit.   Subjective Assessment - 01/09/22 0828     Subjective  Pt reports that his resting hand splint is not fitting well.    Pertinent History RCVA s/p hemicraniectomy 07/19/19  with abdominal flap implant.  PMH: hx of seizures and headaches s/p CVA, hx of DVT with IVC filter placement . Pt s/p botox on 09/24/21. Pt returns to occupational therapy with the following deficits: spasticity, decreased LUE functional use,    Limitations Pt reports he is driving, and was cleared by MD    Patient Stated Goals improve I with bathing, pulling on shirt, donning shoes and tying shoes    Currently in Pain? No/denies                          Treatment: Therapist fabricated a custom resting hand  splint as pt's previous prefab splint is no longer fitting well.Therapist did not issue splint as pt attempted application and removal of splint and he demonstrated difficulty. Pt will benefit from additional education.          OT Short Term Goals - 12/23/21 1755       OT SHORT TERM GOAL #1   Title Patient will complete an updated HEP to ensure management of RUE    Baseline Has prior program,  pt s/p botox- need to update program    Time 3    Period Weeks    Status On-going    Target Date 01/03/22      OT SHORT TERM GOAL #2   Title Pt will donn shirt modified Independently    Baseline requires min A    Time 3    Period Weeks    Status On-going      OT SHORT TERM GOAL #3         OT SHORT TERM GOAL #4   Status               OT Long Term Goals - 12/17/21 1343       OT LONG TERM GOAL #1   Title Patient will complete an aquatic exercise program he can complete at his gym with support of family member    Baseline Dependent, pt's bother who was preveiously trained is no longer available, pt would like for his wife to be trained to assist with aquatics program    Time 6    Period Weeks    Status On-going    Target Date 01/24/22      OT LONG TERM GOAL #2   Title Pt/family will be I with adapted strategies  for ADLS    Baseline Pt reports continued difficulty with donning shirt, bathing , donning shoes and socks and tying shoes    Time 6    Period Weeks     ongoing                                    Plan - 01/09/22 0829     Clinical Impression Statement Pt reports his prefab resting hand splint from hospital is coming off now . Therapist made pt a custom resting hand splint, however did not issue as pt demonstrated difficulty with application. Pt will need education regarding splint application at next in clinic visit.    OT Occupational Profile and History Detailed Assessment- Review of Records and additional review of physical, cognitive,  psychosocial history related to current functional performance    Occupational performance deficits (Please refer to evaluation for details): ADL's;IADL's;Leisure;Social Participation    Body Structure / Function / Physical Skills ADL;UE functional use;Balance;Flexibility;FMC;ROM;Gait;GMC;Coordination;Decreased knowledge of precautions;Decreased knowledge of use  of DME;IADL;Strength;Dexterity;Mobility;Tone    Cognitive Skills Attention;Memory;Problem Solve;Safety Awareness;Thought    Rehab Potential Good    Clinical Decision Making Limited treatment options, no task modification necessary    Comorbidities Affecting Occupational Performance: May have comorbidities impacting occupational performance    Modification or Assistance to Complete Evaluation  No modification of tasks or assist necessary to complete eval    OT Frequency 1x / week   plus eval   OT Duration 6 weeks    OT Treatment/Interventions Self-care/ADL training;Ultrasound;Energy conservation;Aquatic Therapy;DME and/or AE instruction;Patient/family education;Passive range of motion;Paraffin;Cryotherapy;Fluidtherapy;Functional Mobility Training;Splinting;Therapeutic activities;Manual Therapy;Moist Heat;Cognitive remediation/compensation    Plan education regarding splint application wear and care, updated HEP,aquatic therapy - for left sided activation, balance, and UE stretch/active movement,    Consulted and Agree with Plan of Care Patient             Patient will benefit from skilled therapeutic intervention in order to improve the following deficits and impairments:   Body Structure / Function / Physical Skills: ADL, UE functional use, Balance, Flexibility, FMC, ROM, Gait, GMC, Coordination, Decreased knowledge of precautions, Decreased knowledge of use of DME, IADL, Strength, Dexterity, Mobility, Tone Cognitive Skills: Attention, Memory, Problem Solve, Safety Awareness, Thought     Visit Diagnosis: Spastic hemiplegia of left  nondominant side as late effect of cerebral infarction (HCC)  Muscle weakness (generalized)  Unsteadiness on feet  Attention and concentration deficit  Other disturbances of skin sensation  Stiffness of left shoulder, not elsewhere classified  Abnormal posture  Other abnormalities of gait and mobility  Other symptoms and signs involving the musculoskeletal system    Problem List Patient Active Problem List   Diagnosis Date Noted   COVID-19 virus infection 04/21/2021   Left-sided weakness 04/21/2021   S/P craniotomy 06/05/2020   History of cranioplasty 06/05/2020   Peri-rectal abscess 02/14/2020   Abnormal CT scan, pelvis 02/14/2020   Pancytopenia ([place]) 12/02/2019   Nephrolithiasis 12/02/2019   Hydronephrosis with renal and ureteral calculus obstruction 12/02/2019   Rectal pain 12/02/2019   Hyperkalemia 12/02/2019   Reactive depression    Wound infection after surgery    Sleep disturbance    Dysphagia, post-stroke    Transaminitis    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   Cerebral abscess    Urinary tract infection without hematuria    Altered mental status    Primary hypercoagulable state ([place])    Acute pulmonary embolism without acute cor pulmonale (HCC)    Deep vein thrombosis (DVT) of non-extremity vein    Hypokalemia    Acute blood loss anemia    Leukocytosis    Endotracheal tube present    Acute respiratory failure with hypoxemia (HCC)    Stroke (cerebrum) ([place]) 07/19/2019   Pressure injury of skin 07/19/2019   Acute CVA (cerebrovascular accident) ([place])    Encephalopathy    Dysphagia    Acute encephalopathy    Essential hypertension    Obesity 03/12/2016   Scalp abscess 12/20/2015   Neck abscess 12/20/2015   Pilonidal cyst 02/08/2013    Diyana Starrett, OT 01/09/2022, 8:32 AM  [facility] [address] [place], [place], [address] Phone: [phone]   Fax:   [phone]  Name: UKIAH TRAWICK MRN: [id number] Date of Birth: 1976/06/29

## 2022-01-13 ENCOUNTER — Encounter (HOSPITAL_COMMUNITY): Payer: Self-pay | Admitting: Emergency Medicine

## 2022-01-13 ENCOUNTER — Other Ambulatory Visit: Payer: Self-pay

## 2022-01-13 ENCOUNTER — Emergency Department (HOSPITAL_COMMUNITY)
Admission: EM | Admit: 2022-01-13 | Discharge: 2022-01-13 | Disposition: A | Discharge status: Home / Self Care | Payer: Medicaid Other | Attending: Emergency Medicine | Admitting: Emergency Medicine

## 2022-01-13 ENCOUNTER — Ambulatory Visit: Payer: Medicaid Other | Admitting: Occupational Therapy

## 2022-01-13 DIAGNOSIS — R208 Other disturbances of skin sensation: Secondary | ICD-10-CM

## 2022-01-13 DIAGNOSIS — R519 Headache, unspecified: Secondary | ICD-10-CM | POA: Insufficient documentation

## 2022-01-13 DIAGNOSIS — R41840 Attention and concentration deficit: Secondary | ICD-10-CM

## 2022-01-13 DIAGNOSIS — M542 Cervicalgia: Secondary | ICD-10-CM | POA: Diagnosis not present

## 2022-01-13 DIAGNOSIS — R2681 Unsteadiness on feet: Secondary | ICD-10-CM | POA: Diagnosis not present

## 2022-01-13 DIAGNOSIS — Y92410 Unspecified street and highway as the place of occurrence of the external cause: Secondary | ICD-10-CM | POA: Diagnosis not present

## 2022-01-13 DIAGNOSIS — M25519 Pain in unspecified shoulder: Secondary | ICD-10-CM | POA: Diagnosis not present

## 2022-01-13 DIAGNOSIS — R293 Abnormal posture: Secondary | ICD-10-CM | POA: Diagnosis not present

## 2022-01-13 DIAGNOSIS — M25612 Stiffness of left shoulder, not elsewhere classified: Secondary | ICD-10-CM

## 2022-01-13 DIAGNOSIS — Z5321 Procedure and treatment not carried out due to patient leaving prior to being seen by health care provider: Secondary | ICD-10-CM | POA: Diagnosis not present

## 2022-01-13 DIAGNOSIS — R29898 Other symptoms and signs involving the musculoskeletal system: Secondary | ICD-10-CM | POA: Diagnosis not present

## 2022-01-13 DIAGNOSIS — I69354 Hemiplegia and hemiparesis following cerebral infarction affecting left non-dominant side: Secondary | ICD-10-CM | POA: Diagnosis not present

## 2022-01-13 DIAGNOSIS — M6281 Muscle weakness (generalized): Secondary | ICD-10-CM | POA: Diagnosis not present

## 2022-01-13 DIAGNOSIS — R2689 Other abnormalities of gait and mobility: Secondary | ICD-10-CM | POA: Diagnosis not present

## 2022-01-13 DIAGNOSIS — G4489 Other headache syndrome: Secondary | ICD-10-CM | POA: Diagnosis not present

## 2022-01-13 NOTE — ED Notes (Signed)
Registration states patient left
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
## 2022-01-14 ENCOUNTER — Encounter: Payer: Self-pay | Admitting: Occupational Therapy

## 2022-01-14 NOTE — Therapy (Signed)
[facility] [address] [place], [place], [address] Phone: [phone]   Fax:  [phone]  Occupational Therapy Treatment  Patient Details  Name: Matthew Black MRN: [id number] Date of Birth: 05-29-1976 Referring Provider (OT): Dr. 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   Depression    DVT (deep venous thrombosis) ([place])    BLE DVT 07/21/19, 08/02/19; s/p retrievable IVC filter 07/21/19   Hemorrhagic stroke ([place])    History of kidney stones    Hypertension    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   PE (pulmonary thromboembolism) ([place])    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   Stroke ([place])    RICA, R A1, R MCA occlusion 07/19/19    Past Surgical History:  Procedure Laterality Date   CRANIOPLASTY Right 06/05/2020   Procedure: CRANIOPLASTY;  Surgeon: Consuella Lose, MD;  Location: [facility];  Service: Neurosurgery;  Laterality: Right;  right   CRANIOTOMY Right 07/19/2019   Procedure: RIGHT HEMI-CRANIECTOMY With implantation of skull flap to abdominal wall;  Surgeon: Consuella Lose, MD;  Location: [facility];  Service: Neurosurgery;  Laterality: Right;   CYST EXCISION N/A 10/08/2016   Procedure: EXCISION OF POSTERIOR NECK CYST;  Surgeon: Clovis Riley, MD;  [facility];  Service: General;  Laterality: N/A;   CYSTOSCOPY/URETEROSCOPY/HOLMIUM LASER/STENT PLACEMENT Right 03/16/2020   Procedure:  CYSTOSCOPY RIGHT RETROGRADE PYELOGRAM URETEROSCOPY/HOLMIUM LASER/STENT PLACEMENT;  Surgeon: Lucas Mallow, MD;  [facility];  Service: Urology;  Laterality: Right;   INCISION AND DRAINAGE ABSCESS N/A 09/22/2014   Procedure: INCISION AND DRAINAGE ABSCESS POSTERIOR NECK;  Surgeon: Pedro Earls, MD;  [facility];  Service: General;  Laterality: N/A;   INCISION AND DRAINAGE ABSCESS N/A 12/20/2015   Procedure: INCISION AND DRAINAGE POSTERIOR NECK MASS;  Surgeon: Armandina Gemma, MD;  [facility];  Service: General;  Laterality: N/A;   INCISION AND DRAINAGE ABSCESS Left 07/10/2004   middle finger   IR IVC FILTER PLMT / S&I /IMG GUID/MOD SED  07/21/2019   IR RADIOLOGIST EVAL & MGMT  12/14/2019   IR RADIOLOGIST EVAL & MGMT  12/21/2020   IR VENOGRAM RENAL UNI RIGHT  07/21/2019   MASS EXCISION N/A 07/21/2017   Procedure: EXCISION OF BENIGN NECK LESION WITH LAYERED CLOSURE;  Surgeon: Irene Limbo, MD;  Location: [facility];  Service: Plastics;  Laterality: N/A;   MASS EXCISION N/A 11/10/2017   Procedure: EXCISION BENIGN LESION OF THE NECK WITH LAYERED CLOSURE;  Surgeon: Irene Limbo, MD;  Location: [facility];  Service: Plastics;  Laterality: N/A;    There were no vitals filed for this visit.   Subjective Assessment - 01/14/22 0900     Subjective  I like working on walking and my shoulder in the pool.  I always feel better after the pool.    Pertinent History RCVA s/p hemicraniectomy 07/19/19 with abdominal flap implant.  PMH: hx of seizures and headaches s/p CVA, hx of DVT with IVC filter placement . Pt s/p botox on 09/24/21. Pt returns to occupational therapy with the following deficits: spasticity, decreased LUE functional use,    Limitations Pt reports he is driving, and was cleared by MD    Patient Stated Goals improve I with bathing, pulling on shirt, donning shoes and tying shoes    Currently in Pain? No/denies    Pain Score 0-No pain              Patient seen for aquatic therapy visit today,  Patient entered and exited the pool via chair lift - total assist  Patient wore air cast on left ankle to promote better alignment for standing and walking tasks.  Patient reluctant to walk without one hand in contact with wall or person to ensure weight shift onto left leg.  Patient able to relax with supine floatation to allow increased range at left shoulder and elbow.                         OT Short Term Goals - 12/23/21 1755       OT SHORT TERM GOAL #1   Title Patient will complete an updated HEP to ensure management of RUE    Baseline Has prior program,  pt s/p botox- need to update program    Time 3    Period Weeks    Status On-going    Target Date 01/03/22      OT SHORT TERM GOAL #2   Title Pt will donn shirt modified Independently    Baseline requires min A    Time 3    Period Weeks    Status On-going      OT SHORT TERM GOAL #3   Status On-going      OT SHORT TERM GOAL #4   Status On-going               OT Long Term Goals - 12/17/21 1343       OT LONG TERM GOAL #1   Title Patient will complete an aquatic exercise program he can complete at his gym with support of family member    Baseline Dependent, pt's bother who was preveiously trained is no longer available, pt would like for his wife to be trained to assist with aquatics program    Time 6    Period Weeks    Status On-going    Target Date 01/24/22      OT LONG TERM GOAL #2   Title Pt/family will be I with adapted strategies  for ADLS    Baseline Pt reports continued difficulty with donning shirt, bathing , donning shoes and socks and tying shoes    Time 6    Period Weeks    Status On-going      OT LONG TERM GOAL #3   Status On-going      OT LONG TERM GOAL #4   Status On-going                   Plan - 01/14/22 0902     Clinical Impression Statement Patient reports less shoulder pain overall, still challenged  by dense sensory deficits and hypertension with attempts to weight shift to left.    OT Occupational Profile and History Detailed  Assessment- Review of Records and additional review of physical, cognitive, psychosocial history related to current functional performance    Occupational performance deficits (Please refer to evaluation for details): ADL's;IADL's;Leisure;Social Participation    Body Structure / Function / Physical Skills ADL;UE functional use;Balance;Flexibility;FMC;ROM;Gait;GMC;Coordination;Decreased knowledge of precautions;Decreased knowledge of use of DME;IADL;Strength;Dexterity;Mobility;Tone    Cognitive Skills Attention;Memory;Problem Solve;Safety Awareness;Thought    Rehab Potential Good    Clinical Decision Making Limited treatment options, no task modification necessary    Comorbidities Affecting Occupational Performance: May have comorbidities impacting occupational performance    Modification or Assistance to Complete Evaluation  No modification of tasks or assist necessary to complete eval    OT Frequency 1x / week   plus eval   OT Duration 6 weeks    OT Treatment/Interventions Self-care/ADL training;Ultrasound;Energy conservation;Aquatic Therapy;DME and/or AE instruction;Patient/family education;Passive range of motion;Paraffin;Cryotherapy;Fluidtherapy;Functional Mobility Training;Splinting;Therapeutic activities;Manual Therapy;Moist Heat;Cognitive remediation/compensation    Plan education regarding splint application wear and care, updated HEP,aquatic therapy - for left sided activation, balance, and UE stretch/active movement,    Consulted and Agree with Plan of Care Patient             Patient will benefit from skilled therapeutic intervention in order to improve the following deficits and impairments:   Body Structure / Function / Physical Skills: ADL, UE functional use, Balance, Flexibility, FMC, ROM, Gait, GMC, Coordination, Decreased knowledge of precautions,  Decreased knowledge of use of DME, IADL, Strength, Dexterity, Mobility, Tone Cognitive Skills: Attention, Memory, Problem Solve, Safety Awareness, Thought     Visit Diagnosis: 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)  Muscle weakness (generalized)  Unsteadiness on feet  Attention and concentration deficit  Other disturbances of skin sensation  Stiffness of left shoulder, not elsewhere classified  Abnormal posture    Problem List Patient Active Problem List   Diagnosis Date Noted   COVID-19 virus infection 04/21/2021   Left-sided weakness 04/21/2021   S/P craniotomy 06/05/2020   History of cranioplasty 06/05/2020   Peri-rectal abscess 02/14/2020   Abnormal CT scan, pelvis 02/14/2020   Pancytopenia ([place]) 12/02/2019   Nephrolithiasis 12/02/2019   Hydronephrosis with renal and ureteral calculus obstruction 12/02/2019   Rectal pain 12/02/2019   Hyperkalemia 12/02/2019   Reactive depression    Wound infection after surgery    Sleep disturbance    Dysphagia, post-stroke    Transaminitis    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   Cerebral abscess    Urinary tract infection without hematuria    Altered mental status    Primary hypercoagulable state ([place])    Acute pulmonary embolism without acute cor pulmonale (HCC)    Deep vein thrombosis (DVT) of non-extremity vein    Hypokalemia    Acute blood loss anemia    Leukocytosis    Endotracheal tube present    Acute respiratory failure with hypoxemia ([place])    Stroke (cerebrum) ([place]) 07/19/2019   Pressure injury of skin 07/19/2019   Acute CVA (cerebrovascular accident) ([place])    Encephalopathy    Dysphagia    Acute encephalopathy    Essential hypertension    Obesity 03/12/2016   Scalp abscess 12/20/2015   Neck abscess 12/20/2015   Pilonidal cyst 02/08/2013    Mariah Milling, OT 01/14/2022, 9:03 AM  [facility] [address] [place], [place], [address] Phone: [phone]   Fax:  [phone]  Name: MAITLAND LESIAK MRN: [id number] Date of Birth: February 01, 1976

## 2022-01-20 ENCOUNTER — Ambulatory Visit: Payer: Self-pay | Admitting: Occupational Therapy

## 2022-01-21 ENCOUNTER — Encounter
Payer: Medicaid Other | Attending: Physical Medicine and Rehabilitation | Admitting: Physical Medicine and Rehabilitation

## 2022-01-21 ENCOUNTER — Other Ambulatory Visit: Payer: Self-pay

## 2022-01-21 ENCOUNTER — Encounter: Payer: Self-pay | Admitting: Physical Medicine and Rehabilitation

## 2022-01-21 VITALS — BP 119/80 | HR 74 | Temp 99.4°F | Ht 71.0 in | Wt 237.0 lb

## 2022-01-21 DIAGNOSIS — I639 Cerebral infarction, unspecified: Secondary | ICD-10-CM | POA: Insufficient documentation

## 2022-01-21 NOTE — Progress Notes (Signed)
.  botBotox Injection for spasticity and left sided UE internal rotation using ultrasound guidance  Indication: Severe spasticity which interferes with ADL,mobility and/or  hygiene and is unresponsive to medication management and other conservative care Informed consent was obtained after describing risks and benefits of the procedure with the patient. This includes bleeding, bruising, infection, excessive weakness, or medication side effects. A REMS form is on file and signed. Needle: 25cc needle Number of units per muscle Left pectoralis major: 100U in 3 sites  100 U injected into 3 sites All injections were done after obtaining appropriate ultrasound visualization (pictures taken) and after negative drawback for blood. The patient tolerated the procedure well. Post procedure instructions were given. A followup appointment was made.

## 2022-01-24 NOTE — Addendum Note (Signed)
Addended by: Izora Ribas on: 01/24/2022 12:47 PM   Modules accepted: Orders

## 2022-01-27 ENCOUNTER — Encounter: Payer: Self-pay | Admitting: Occupational Therapy

## 2022-01-27 ENCOUNTER — Other Ambulatory Visit: Payer: Self-pay

## 2022-01-27 ENCOUNTER — Ambulatory Visit: Payer: Medicaid Other | Attending: Medical | Admitting: Occupational Therapy

## 2022-01-27 DIAGNOSIS — R2681 Unsteadiness on feet: Secondary | ICD-10-CM | POA: Diagnosis not present

## 2022-01-27 DIAGNOSIS — I69354 Hemiplegia and hemiparesis following cerebral infarction affecting left non-dominant side: Secondary | ICD-10-CM | POA: Diagnosis not present

## 2022-01-27 DIAGNOSIS — R41840 Attention and concentration deficit: Secondary | ICD-10-CM

## 2022-01-27 DIAGNOSIS — M6281 Muscle weakness (generalized): Secondary | ICD-10-CM | POA: Diagnosis not present

## 2022-01-27 DIAGNOSIS — R208 Other disturbances of skin sensation: Secondary | ICD-10-CM | POA: Diagnosis not present

## 2022-01-27 DIAGNOSIS — M25612 Stiffness of left shoulder, not elsewhere classified: Secondary | ICD-10-CM | POA: Diagnosis not present

## 2022-01-27 DIAGNOSIS — R293 Abnormal posture: Secondary | ICD-10-CM | POA: Diagnosis not present
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
## 2022-02-04 ENCOUNTER — Ambulatory Visit: Payer: Medicaid Other | Admitting: Medical

## 2022-02-12 ENCOUNTER — Telehealth (INDEPENDENT_AMBULATORY_CARE_PROVIDER_SITE_OTHER): Payer: Medicaid Other | Admitting: Medical

## 2022-02-12 VITALS — BP 133/97 | HR 62

## 2022-02-12 DIAGNOSIS — F329 Major depressive disorder, single episode, unspecified: Secondary | ICD-10-CM

## 2022-02-12 DIAGNOSIS — I10 Essential (primary) hypertension: Secondary | ICD-10-CM | POA: Diagnosis not present

## 2022-02-12 DIAGNOSIS — R739 Hyperglycemia, unspecified: Secondary | ICD-10-CM

## 2022-02-12 DIAGNOSIS — Z8673 Personal history of transient ischemic attack (TIA), and cerebral infarction without residual deficits: Secondary | ICD-10-CM | POA: Diagnosis not present

## 2022-02-12 DIAGNOSIS — Z800 Family history of malignant neoplasm of digestive organs: Secondary | ICD-10-CM

## 2022-02-12 DIAGNOSIS — E7849 Other hyperlipidemia: Secondary | ICD-10-CM

## 2022-02-12 NOTE — Patient Instructions (Signed)
Your blood pressure is reasonable systolic at 215 but diastolic is too high at 97.  Asking wife to check his blood pressure daily over the next 5 days and update me on those readings.  Might need to add low-dose calcium channel blocker or ARB in addition to current beta-blocker. ?  ?For high cholesterol continue cholesterol med. Continue low dose aspirin.  ?  ?For history of stroke and left side deficits  Follow up with neurologist as scheduled. ?  ?For depression.continue effexor. Glad to hear mood is better. ?  ?For muscle spasms continue baclofen. ? ?Placing referral to GI MD to discuss screening colonoscopy based on data and granddad history of colon cancer. ? ?Follow-up date to be determined after lab review. ?

## 2022-02-12 NOTE — Progress Notes (Signed)
? ?Subjective:  ? ? Patient ID: Matthew Black, male    DOB: 08/18/1976, 46 y.o.   MRN: [id number] ? ?HPI ? ?Virtual Visit via Video Note ? ?I connected with Matthew Black on 02/12/22 at  3:00 PM EDT by a video enabled telemedicine application and verified that I am speaking with the correct person using two identifiers. ? ?Location: ?Patient: home ?Provider: office. ?  ?I discussed the limitations of evaluation and management by telemedicine and the availability of in person appointments. The patient expressed understanding and agreed to proceed. ? ?History of Present Illness: ?Post stroke pt has both left side upper and lower extremity deficits. ?  ?Pt aphasia has resolved significantly per his report. But on review today he admits has some difficulty expressing himself.  ?  ?Pt had some depression in past. He was on effexor daily and he states is helping with no side effects. ?  ?Pt was on baclofen post hospitalization. He ran Saturday and has spasm of left leg since then. ?  ?  ?Hx of htn. Pt is on lopressor 50 mg daily. Has bp cuff and has not checked bp today. ?  ?Hx of high cholesterol. Pt is on atorvastatin 10 mg daily. ?  ?  ?Pt last visit with specialist . ?  ?".botBotox Injection for spasticity and left sided UE internal rotation using ultrasound guidance ?  ?Indication: Severe spasticity which interferes with ADL,mobility and/or  hygiene and is unresponsive to medication management and other conservative care ?Informed consent was obtained after describing risks and benefits of the procedure with the patient. This includes bleeding, bruising, infection, excessive weakness, or medication side effects. A REMS form is on file and signed. ?Needle: 25cc needle ?Number of units per muscle ?Left pectoralis major: 100U in 3 sites" ? ? ?Dad and grandad both had colon cancer- Dad was 39 year old when dx. He is not sure of grandad age. ? ?  ?Observations/Objective: ? ?General-no acute distress, pleasant,  oriented. ?Lungs- on inspection lungs appear unlabored. ?Neck- no tracheal deviation or jvd on inspection. ?Neuro- gross motor function appears intact.  ? ? ?Assessment and Plan: ? ?Your blood pressure is reasonable systolic at 532 but diastolic is too high at 97.  Asking wife to check his blood pressure daily over the next 5 days and update me on those readings.  Might need to add low-dose calcium channel blocker or ARB in addition to current beta-blocker. ?  ?For high cholesterol continue cholesterol med. Continue low dose aspirin.  ?  ?For history of stroke and left side deficits  Follow up with neurologist as scheduled. ?  ?For depression.continue effexor. Glad to hear mood is better. ?  ?For muscle spasms continue baclofen. ? ? ?Follow-up date to be determined after lab review ? ?Mackie Pai, PA-C  ?Placing referral to GI MD to discuss screening colonoscopy based on data and granddad history of colon cancer. ? ?Follow-up date to be determined after lab review. ? ?Time spent with patient today was 31  minutes which consisted of chart revdiew, discussing diagnosis, work up, treatment and documentation.  ? ? ?Follow Up Instructions: ? ?  ?I discussed the assessment and treatment plan with the patient. The patient was provided an opportunity to ask questions and all were answered. The patient agreed with the plan and demonstrated an understanding of the instructions. ?  ?The patient was advised to call back or seek an in-person evaluation if the symptoms worsen or if the condition fails  to improve as anticipated. ? ? ? ? ?Mackie Pai, PA-C  ? ?Review of Systems ? ?   ?Objective:  ? Physical Exam ? ? ? ? ?   ?Assessment & Plan:  ? ? ?

## 2022-02-13 ENCOUNTER — Telehealth: Payer: Self-pay | Admitting: Medical

## 2022-02-13 NOTE — Telephone Encounter (Signed)
Patient called back to give [place] the number to [facility] where his independent assessment forms need to be faxed to. ? ?Fax number: [phone] ?Toll free number: [phone] ? ?For any questions Tiffany the one helping the patient with these forms can be reached at: [phone]. ?

## 2022-02-17 NOTE — Telephone Encounter (Signed)
Forms faxed

## 2022-02-18 ENCOUNTER — Telehealth: Payer: Self-pay

## 2022-02-18 NOTE — Telephone Encounter (Signed)
Matthew Black complains of headache, dizziness, nausea, chest / arm pain. Patient has been without blood pressure medication for 4 days.  ? ?Please confirm if he is be taking Metoprol BID or once daily. He will need a new script sent. ? ?Per Dr. Ranell Patrick seek emergency help. Now. ? ?Matthew Black has been advised. He will call his stepfather for transpiration or EMS. ?

## 2022-02-18 NOTE — Telephone Encounter (Signed)
Pt called and unable to leave voicemail 

## 2022-02-18 NOTE — Telephone Encounter (Signed)
[facility] doesn't accept patient's insurance , notified patient he needs to contact insurance customer service  ? ?

## 2022-02-19 ENCOUNTER — Telehealth: Payer: Self-pay | Admitting: Medical

## 2022-02-19 ENCOUNTER — Telehealth: Payer: Self-pay

## 2022-02-19 ENCOUNTER — Other Ambulatory Visit (INDEPENDENT_AMBULATORY_CARE_PROVIDER_SITE_OTHER): Payer: Medicaid Other

## 2022-02-19 DIAGNOSIS — E7849 Other hyperlipidemia: Secondary | ICD-10-CM

## 2022-02-19 DIAGNOSIS — R739 Hyperglycemia, unspecified: Secondary | ICD-10-CM | POA: Diagnosis not present

## 2022-02-19 DIAGNOSIS — I10 Essential (primary) hypertension: Secondary | ICD-10-CM | POA: Diagnosis not present

## 2022-02-19 NOTE — Telephone Encounter (Signed)
Pt called wondering how many times is he supposed to take his metoprolol  , the hospital BID but unsure if its once or twice  ? ? ?Also needs a refill on medication ?

## 2022-02-19 NOTE — Telephone Encounter (Signed)
Pt called back and made him aware to call the # on back of his card ?

## 2022-02-20 ENCOUNTER — Telehealth: Payer: Self-pay

## 2022-02-20 ENCOUNTER — Telehealth: Payer: Self-pay | Admitting: Medical

## 2022-02-20 DIAGNOSIS — I10 Essential (primary) hypertension: Secondary | ICD-10-CM

## 2022-02-20 LAB — LIPID PANEL
Cholesterol: 124 mg/dL (ref 0–200)
HDL: 32.9 mg/dL — ABNORMAL LOW (ref >39.00)
LDL Cholesterol: 75 mg/dL (ref 0–99)
NonHDL: 91.57
Total CHOL/HDL Ratio: 4
Triglycerides: 81 mg/dL (ref 0.0–149.0)
VLDL: 16.2 mg/dL (ref 0.0–40.0)

## 2022-02-20 LAB — CBC WITH DIFFERENTIAL/PLATELET
Basophils Absolute: 0 10*3/uL (ref 0.0–0.1)
Basophils Relative: 0.6 % (ref 0.0–3.0)
Eosinophils Absolute: 0.2 10*3/uL (ref 0.0–0.7)
Eosinophils Relative: 2.3 % (ref 0.0–5.0)
HCT: 47.7 % (ref 39.0–52.0)
Hemoglobin: 15.2 g/dL (ref 13.0–17.0)
Lymphocytes Relative: 49.3 % — ABNORMAL HIGH (ref 12.0–46.0)
Lymphs Abs: 3.7 10*3/uL (ref 0.7–4.0)
MCHC: 31.9 g/dL (ref 30.0–36.0)
MCV: 82.8 fl (ref 78.0–100.0)
Monocytes Absolute: 0.8 10*3/uL (ref 0.1–1.0)
Monocytes Relative: 10.3 % (ref 3.0–12.0)
Neutro Abs: 2.8 10*3/uL (ref 1.4–7.7)
Neutrophils Relative %: 37.5 % — ABNORMAL LOW (ref 43.0–77.0)
Platelets: 239 10*3/uL (ref 150.0–400.0)
RBC: 5.77 Mil/uL (ref 4.22–5.81)
RDW: 15.6 % — ABNORMAL HIGH (ref 11.5–15.5)
WBC: 7.6 10*3/uL (ref 4.0–10.5)

## 2022-02-20 LAB — COMPREHENSIVE METABOLIC PANEL
ALT: 24 U/L (ref 0–53)
AST: 19 U/L (ref 0–37)
Albumin: 4.3 g/dL (ref 3.5–5.2)
Alkaline Phosphatase: 153 U/L — ABNORMAL HIGH (ref 39–117)
BUN: 10 mg/dL (ref 6–23)
CO2: 28 mEq/L (ref 19–32)
Calcium: 9.4 mg/dL (ref 8.4–10.5)
Chloride: 106 mEq/L (ref 96–112)
Creatinine, Ser: 1.22 mg/dL (ref 0.40–1.50)
GFR: 72.24 mL/min (ref >60.00)
Glucose, Bld: 86 mg/dL (ref 70–99)
Potassium: 4.1 mEq/L (ref 3.5–5.1)
Sodium: 142 mEq/L (ref 135–145)
Total Bilirubin: 0.4 mg/dL (ref 0.2–1.2)
Total Protein: 7.8 g/dL (ref 6.0–8.3)

## 2022-02-20 LAB — HEMOGLOBIN A1C: Hgb A1c MFr Bld: 6.2 % (ref 4.6–6.5)

## 2022-02-20 MED ORDER — METOPROLOL TARTRATE 50 MG PO TABS: 50.0000 mg | ORAL_TABLET | Freq: Two times a day (BID) | ORAL | 1 refills | Status: DC

## 2022-02-20 NOTE — Telephone Encounter (Signed)
Pt called and stated his medication bottle in the car and but he believes it doesn't say " ER " and it says once a day ? ?Doesn't want to come in for an appointment since he was just in office  ? ?Stated he needs a refill on metoprolol  ?

## 2022-02-20 NOTE — Addendum Note (Signed)
Addended by: Casilda Carls on: 02/20/2022 09:55 AM ? ? Modules accepted: Orders ? ?

## 2022-02-20 NOTE — Telephone Encounter (Signed)
"  Matthew Black complains of headache, dizziness, nausea, chest / arm pain. Patient has been without blood pressure medication for 4 days.  ?  ?Please confirm if he is be taking Metoprol BID or once daily. He will need a new script sent. ?  ?Per Dr. Ranell Patrick seek emergency help. Now. ?  ?Matthew Black has been advised. He will call his stepfather for transpiration or EMS." ? ? ?Above is the message he sent to Jorja Loa on 02-18-2022.  ? ? ?I did not see this until you sent me message that he think he has ER tabs and that he does not want to come in. ? ?So need udated bp on Monday, Need to know if on metoprolol ER 50 mg daily and ned to know no ha, dizziness no nausea, no chest pain or arm pain.  Appears he never went to ED so we need to proceed with caution and get info ?

## 2022-02-20 NOTE — Telephone Encounter (Signed)
[facility] sent a fax stating the patient needs a new prescription for Metoprolol. They stated the patient informed him that he is taking it twice a day and the prescription says once daily. Please advise ?

## 2022-02-20 NOTE — Telephone Encounter (Signed)
Opened in error

## 2022-02-21 NOTE — Telephone Encounter (Signed)
Please refer to other phone note.  °

## 2022-02-24 NOTE — Telephone Encounter (Signed)
Tried to call and check on patient but no answer no vm.  I spoke with pharmacy and they stated that pt has always been on metoprolol tartrate and patient did pick up rx that was sent in by cardiologist on 02/20/22. ?

## 2022-02-26 ENCOUNTER — Telehealth: Payer: Self-pay

## 2022-02-26 NOTE — Telephone Encounter (Signed)
Patient is calling to get a new referral for OT.  ?

## 2022-02-28 ENCOUNTER — Other Ambulatory Visit: Payer: Self-pay | Admitting: Physical Medicine and Rehabilitation

## 2022-02-28 DIAGNOSIS — I639 Cerebral infarction, unspecified: Secondary | ICD-10-CM

## 2022-03-04 NOTE — Telephone Encounter (Signed)
Patient notified of OT referral ?

## 2022-03-10 ENCOUNTER — Ambulatory Visit: Payer: Medicaid Other | Attending: Medical | Admitting: Occupational Therapy

## 2022-03-10 ENCOUNTER — Encounter: Payer: Self-pay | Admitting: Occupational Therapy

## 2022-03-10 DIAGNOSIS — M25612 Stiffness of left shoulder, not elsewhere classified: Secondary | ICD-10-CM | POA: Insufficient documentation

## 2022-03-10 DIAGNOSIS — M6281 Muscle weakness (generalized): Secondary | ICD-10-CM | POA: Insufficient documentation

## 2022-03-10 DIAGNOSIS — R29898 Other symptoms and signs involving the musculoskeletal system: Secondary | ICD-10-CM | POA: Diagnosis not present

## 2022-03-10 DIAGNOSIS — I69354 Hemiplegia and hemiparesis following cerebral infarction affecting left non-dominant side: Secondary | ICD-10-CM | POA: Insufficient documentation

## 2022-03-10 DIAGNOSIS — R41840 Attention and concentration deficit: Secondary | ICD-10-CM | POA: Diagnosis not present

## 2022-03-10 DIAGNOSIS — R2681 Unsteadiness on feet: Secondary | ICD-10-CM | POA: Diagnosis not present

## 2022-03-10 DIAGNOSIS — M25512 Pain in left shoulder: Secondary | ICD-10-CM | POA: Diagnosis not present

## 2022-03-10 DIAGNOSIS — G8929 Other chronic pain: Secondary | ICD-10-CM | POA: Insufficient documentation

## 2022-03-10 DIAGNOSIS — R482 Apraxia: Secondary | ICD-10-CM | POA: Diagnosis not present

## 2022-03-10 DIAGNOSIS — R293 Abnormal posture: Secondary | ICD-10-CM | POA: Diagnosis not present

## 2022-03-10 DIAGNOSIS — R208 Other disturbances of skin sensation: Secondary | ICD-10-CM | POA: Insufficient documentation

## 2022-03-10 NOTE — Therapy (Signed)
?OUTPATIENT OCCUPATIONAL THERAPY NEURO EVALUATION ? ?Patient Name: Matthew Black ?MRN: [id number] ?DOB:15-Jul-1976, 46 y.o., male ?Today's Date: 03/10/2022 ? ?PCP: Mackie Pai, PA-C ?REFERRING PROVIDER: Leeroy Cha ? ? OT End of Session - 03/10/22 1816   ? ? Visit Number 1   ? Number of Visits 9   ? Date for OT Re-Evaluation 05/24/22   ? Authorization Type Medicaid-Healthy Blue await auth   ? OT Start Time 1545   ? OT Stop Time 1638   ? OT Time Calculation (min) 53 min   ? Equipment Utilized During Treatment floatation   ? Activity Tolerance Patient tolerated treatment well   ? Behavior During Therapy [facility] for tasks assessed/performed   ? ?  ?  ? ?  ? ? ?Past Medical History:  ?Diagnosis Date  ? 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 Depression   ? DVT (deep venous thrombosis) ([place])   ? BLE DVT 07/21/19, 08/02/19; s/p retrievable IVC filter 07/21/19  ? Hemorrhagic stroke ([place])   ? History of kidney stones   ? Hypertension   ? Paralysis ([place])   ? LEFT SIDE  ? PE (pulmonary thromboembolism) ([place])   ? 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  ? Stroke [facility])   ? RICA, R A1, R MCA occlusion 07/19/19  ? ?Past Surgical History:  ?Procedure Laterality Date  ? CRANIOPLASTY Right 06/05/2020  ? Procedure: CRANIOPLASTY;  Surgeon: Consuella Lose, MD;  Location: [place];  Service: Neurosurgery;  Laterality: Right;  right  ? CRANIOTOMY Right 07/19/2019  ? Procedure: RIGHT HEMI-CRANIECTOMY With implantation of skull flap to abdominal wall;  Surgeon: Consuella Lose, MD;  Location: [place];  Service: Neurosurgery;  Laterality: Right;  ? CYST EXCISION N/A 10/08/2016  ? Procedure: EXCISION OF POSTERIOR NECK CYST;  Surgeon: Clovis Riley, MD;  [facility];  Service: General;  Laterality: N/A;  ? CYSTOSCOPY/URETEROSCOPY/HOLMIUM LASER/STENT PLACEMENT Right 03/16/2020  ? Procedure: CYSTOSCOPY RIGHT RETROGRADE PYELOGRAM URETEROSCOPY/HOLMIUM LASER/STENT PLACEMENT;  Surgeon: Lucas Mallow, MD;  [facility];  Service: Urology;  Laterality: Right;  ? INCISION AND DRAINAGE ABSCESS N/A 09/22/2014  ? Procedure: INCISION AND DRAINAGE ABSCESS POSTERIOR NECK;  Surgeon: Pedro Earls, MD;  [facility];  Service: General;  Laterality: N/A;  ? INCISION AND DRAINAGE ABSCESS N/A 12/20/2015  ? Procedure: INCISION AND DRAINAGE POSTERIOR NECK MASS;  Surgeon: Armandina Gemma, MD;  [facility];  Service: General;  Laterality: N/A;  ? INCISION AND DRAINAGE ABSCESS Left 07/10/2004  ? middle finger  ? IR IVC FILTER PLMT / S&I /IMG GUID/MOD SED  07/21/2019  ? IR RADIOLOGIST EVAL & MGMT  12/14/2019  ? IR RADIOLOGIST EVAL & MGMT  12/21/2020  ? IR VENOGRAM RENAL UNI RIGHT  07/21/2019  ? MASS EXCISION N/A 07/21/2017  ? Procedure: EXCISION OF BENIGN NECK LESION WITH LAYERED CLOSURE;  Surgeon: Irene Limbo, MD;  Location: [place];  Service: Plastics;  Laterality: N/A;  ? MASS EXCISION N/A 11/10/2017  ? Procedure: EXCISION BENIGN LESION OF THE NECK WITH LAYERED CLOSURE;  Surgeon: Irene Limbo, MD;  Location: [place];  Service: Plastics;  Laterality: N/A;  ? ?Patient Active Problem List  ? Diagnosis Date Noted  ? COVID-19 virus infection 04/21/2021  ? Left-sided weakness 04/21/2021  ? S/P craniotomy 06/05/2020  ? History of cranioplasty 06/05/2020  ? Peri-rectal abscess 02/14/2020  ? Abnormal CT scan, pelvis 02/14/2020  ? Pancytopenia ([place]) 12/02/2019  ? Nephrolithiasis 12/02/2019  ? Hydronephrosis  with renal and ureteral calculus obstruction 12/02/2019  ? Rectal pain 12/02/2019  ? Hyperkalemia 12/02/2019  ? Reactive depression   ? Wound infection after surgery   ? Sleep disturbance   ? Dysphagia, post-stroke   ? Transaminitis   ? 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 Cerebral abscess   ? Urinary tract infection without hematuria   ? Altered mental status   ? Primary hypercoagulable state ([place])   ? Acute pulmonary embolism without acute cor pulmonale (HCC)   ? Deep vein  thrombosis (DVT) of non-extremity vein   ? Hypokalemia   ? Acute blood loss anemia   ? Leukocytosis   ? Endotracheal tube present   ? Acute respiratory failure with hypoxemia (HCC)   ? Stroke (cerebrum) ([place]) 07/19/2019  ? Pressure injury of skin 07/19/2019  ? Acute CVA (cerebrovascular accident) ([place])   ? Encephalopathy   ? Dysphagia   ? Acute encephalopathy   ? Essential hypertension   ? Obesity 03/12/2016  ? Scalp abscess 12/20/2015  ? Neck abscess 12/20/2015  ? Pilonidal cyst 02/08/2013  ? ? ?ONSET DATE: 02/28/22 - referral date ? ?REFERRING DIAG: CVA  ? ?THERAPY DIAG:  ?Spastic hemiplegia of left nondominant side as late effect of cerebral infarction [facility]) ? ?Unsteadiness on feet ? ?Other disturbances of skin sensation ? ?Abnormal posture ? ?Attention and concentration deficit ? ?Stiffness of left shoulder, not elsewhere classified ? ?Muscle weakness (generalized) ? ?Apraxia ? ?Chronic left shoulder pain ? ?Other symptoms and signs involving the musculoskeletal system ? ?SUBJECTIVE:  ? ?SUBJECTIVE STATEMENT: ?I have been walking in my house without the brace a little bit.   ?Pt accompanied by: family member ? ?PERTINENT HISTORY: RCVA s/p hemicaniectomy 07/19/19 with abdominal flap implant.  PMH: hx of seizures and headaches s/p CVA, hx of DVT with IVC filter placement . Pt s/p botox on 01/21/22. Pt returns to occupational therapy with the following deficits: spasticity, decreased LUE functional use, decreased functional mobility ? ?PRECAUTIONS: Fall ? ?WEIGHT BEARING RESTRICTIONS No ? ?PAIN:  ?Are you having pain? No ? ?FALLS: Has patient fallen in last 6 months? Yes. Number of falls 1 ? ?LIVING ENVIRONMENT: ?Lives with: lives with their family ?Lives in: House/apartment ?Stairs: Yes ?Has following equipment at home: Quad cane large base and Wheelchair (manual) ? ?PLOF: Independent ? ?PATIENT GOALS improve safety with functional mobility in home and community, decrease pain and improve range of LUE ? ?OBJECTIVE:   ? ?HAND DOMINANCE: Right ? ?ADLs: ?Overall ADLs: Min/mod assist ?Transfers/ambulation related to ADLs:walks in home with intermittent assistance - cannot walk long distances, or on uneven surfaces ?Eating: modified independent ?Grooming: modified independent ?UB Dressing: min assist ?LB Dressing: mod assist ?Toileting: modified independent ?Bathing: intermittent min assistance ?Tub Shower transfers: Cannot access shower ? ? ?IADLs: ?Shopping: intermittent min assistance - only light shopping ?Light housekeeping: min ?Meal Prep: min ?Community mobility: min ? ?MOBILITY STATUS: Hx of falls, difficulty with turns, and difficulty carrying objections with ambulation ? ?POSTURE COMMENTS:  ?rounded shoulders, forward head, increased thoracic kyphosis, posterior pelvic tilt, right pelvic obliquity, and flexed trunk  ?Sitting balance: Sits without UE support up to 30 sec ? ?ACTIVITY TOLERANCE: ?Activity tolerance: Patient reports difficulty with endurance with daily life skills, work, and exercise ? ? ?UE ROM    ? ?Active ROM Right ?03/10/2022 Left ?03/10/2022  ?Shoulder flexion WFL 0  ?Shoulder abduction  0  ?Shoulder adduction    ?Shoulder extension    ?Shoulder internal rotation    ?  Shoulder external rotation    ?Elbow flexion  0  ?Elbow extension  0  ?Wrist flexion    ?Wrist extension    ?Wrist ulnar deviation    ?Wrist radial deviation    ?Wrist pronation    ?Wrist supination    ?(Blank rows = not tested) ? ? ?UE MMT:    ? ?MMT Right ?03/10/2022 Left ?03/10/2022  ?Shoulder flexion 4+/5 NT  ?Shoulder abduction    ?Shoulder adduction    ?Shoulder extension    ?Shoulder internal rotation    ?Shoulder external rotation    ?Middle trapezius    ?Lower trapezius    ?Elbow flexion    ?Elbow extension    ?Wrist flexion    ?Wrist extension    ?Wrist ulnar deviation    ?Wrist radial deviation    ?Wrist pronation    ?Wrist supination    ?(Blank rows = not tested) ? ?HAND FUNCTION: ?Patient ahs full functional use of dominant RUE,  Patient does not consistently use LUE for functional grasp.  Difficulty with volitional grasp/release ? ?COORDINATION: ?Patient unable to complete objective measure of coordination with LUE.  Patient can at time

## 2022-03-17 ENCOUNTER — Ambulatory Visit: Payer: Medicaid Other | Admitting: Occupational Therapy

## 2022-03-19 ENCOUNTER — Encounter: Payer: Self-pay | Admitting: Medical

## 2022-03-31 ENCOUNTER — Ambulatory Visit: Payer: Medicaid Other | Admitting: Occupational Therapy

## 2022-04-07 ENCOUNTER — Ambulatory Visit: Payer: Self-pay | Admitting: Occupational Therapy

## 2022-04-07 ENCOUNTER — Encounter: Payer: Self-pay | Admitting: Occupational Therapy

## 2022-04-07 DIAGNOSIS — R29898 Other symptoms and signs involving the musculoskeletal system: Secondary | ICD-10-CM

## 2022-04-07 DIAGNOSIS — I69354 Hemiplegia and hemiparesis following cerebral infarction affecting left non-dominant side: Secondary | ICD-10-CM

## 2022-04-07 DIAGNOSIS — R208 Other disturbances of skin sensation: Secondary | ICD-10-CM

## 2022-04-07 DIAGNOSIS — M25612 Stiffness of left shoulder, not elsewhere classified: Secondary | ICD-10-CM

## 2022-04-07 DIAGNOSIS — R482 Apraxia: Secondary | ICD-10-CM

## 2022-04-07 DIAGNOSIS — M6281 Muscle weakness (generalized): Secondary | ICD-10-CM

## 2022-04-07 DIAGNOSIS — R293 Abnormal posture: Secondary | ICD-10-CM

## 2022-04-07 DIAGNOSIS — R2681 Unsteadiness on feet: Secondary | ICD-10-CM

## 2022-04-07 DIAGNOSIS — G8929 Other chronic pain: Secondary | ICD-10-CM

## 2022-04-07 NOTE — Therapy (Signed)
?OUTPATIENT OCCUPATIONAL THERAPY TREATMENT NOTE ? ? ?Patient Name: Matthew Black ?MRN: [id number] ?DOB:1976/09/05, 46 y.o., male ?Today's Date: 04/07/2022 ? ?PCP: Mackie Pai, PA-C ?REFERRING PROVIDER: Leeroy Cha ? ?END OF SESSION:  ? OT End of Session - 04/07/22 1814   ? ? Visit Number 2   ? Number of Visits 9   ? Date for OT Re-Evaluation 05/24/22   ? Authorization Type Medicaid-Healthy Blue await auth   ? OT Start Time 1550   ? OT Stop Time 1630   ? OT Time Calculation (min) 40 min   ? Equipment Utilized During Treatment floatation   ? Activity Tolerance Patient tolerated treatment well   ? Behavior During Therapy [facility] for tasks assessed/performed   ? ?  ?  ? ?  ? ? ?Past Medical History:  ?Diagnosis Date  ? 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 Depression   ? DVT (deep venous thrombosis) ([place])   ? BLE DVT 07/21/19, 08/02/19; s/p retrievable IVC filter 07/21/19  ? Hemorrhagic stroke ([place])   ? History of kidney stones   ? Hypertension   ? Paralysis ([place])   ? LEFT SIDE  ? PE (pulmonary thromboembolism) ([place])   ? 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  ? Stroke [facility])   ? RICA, R A1, R MCA occlusion 07/19/19  ? ?Past Surgical History:  ?Procedure Laterality Date  ? CRANIOPLASTY Right 06/05/2020  ? Procedure: CRANIOPLASTY;  Surgeon: Consuella Lose, MD;  Location: [place];  Service: Neurosurgery;  Laterality: Right;  right  ? CRANIOTOMY Right 07/19/2019  ? Procedure: RIGHT HEMI-CRANIECTOMY With implantation of skull flap to abdominal wall;  Surgeon: Consuella Lose, MD;  Location: [place];  Service: Neurosurgery;  Laterality: Right;  ? CYST EXCISION N/A 10/08/2016  ? Procedure: EXCISION OF POSTERIOR NECK CYST;  Surgeon: Clovis Riley, MD;  [facility];  Service: General;  Laterality: N/A;  ? CYSTOSCOPY/URETEROSCOPY/HOLMIUM LASER/STENT PLACEMENT Right 03/16/2020  ? Procedure: CYSTOSCOPY RIGHT RETROGRADE PYELOGRAM URETEROSCOPY/HOLMIUM LASER/STENT PLACEMENT;  Surgeon: Lucas Mallow,  MD;  [facility];  Service: Urology;  Laterality: Right;  ? INCISION AND DRAINAGE ABSCESS N/A 09/22/2014  ? Procedure: INCISION AND DRAINAGE ABSCESS POSTERIOR NECK;  Surgeon: Pedro Earls, MD;  [facility];  Service: General;  Laterality: N/A;  ? INCISION AND DRAINAGE ABSCESS N/A 12/20/2015  ? Procedure: INCISION AND DRAINAGE POSTERIOR NECK MASS;  Surgeon: Armandina Gemma, MD;  [facility];  Service: General;  Laterality: N/A;  ? INCISION AND DRAINAGE ABSCESS Left 07/10/2004  ? middle finger  ? IR IVC FILTER PLMT / S&I /IMG GUID/MOD SED  07/21/2019  ? IR RADIOLOGIST EVAL & MGMT  12/14/2019  ? IR RADIOLOGIST EVAL & MGMT  12/21/2020  ? IR VENOGRAM RENAL UNI RIGHT  07/21/2019  ? MASS EXCISION N/A 07/21/2017  ? Procedure: EXCISION OF BENIGN NECK LESION WITH LAYERED CLOSURE;  Surgeon: Irene Limbo, MD;  Location: [place];  Service: Plastics;  Laterality: N/A;  ? MASS EXCISION N/A 11/10/2017  ? Procedure: EXCISION BENIGN LESION OF THE NECK WITH LAYERED CLOSURE;  Surgeon: Irene Limbo, MD;  Location: [place];  Service: Plastics;  Laterality: N/A;  ? ?Patient Active Problem List  ? Diagnosis Date Noted  ? COVID-19 virus infection 04/21/2021  ? Left-sided weakness 04/21/2021  ? S/P craniotomy 06/05/2020  ? History of cranioplasty 06/05/2020  ? Peri-rectal abscess 02/14/2020  ? Abnormal CT scan, pelvis 02/14/2020  ? Pancytopenia ([place]) 12/02/2019  ?  Nephrolithiasis 12/02/2019  ? Hydronephrosis with renal and ureteral calculus obstruction 12/02/2019  ? Rectal pain 12/02/2019  ? Hyperkalemia 12/02/2019  ? Reactive depression   ? Wound infection after surgery   ? Sleep disturbance   ? Dysphagia, post-stroke   ? Transaminitis   ? 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 Cerebral abscess   ? Urinary tract infection without hematuria   ? Altered mental status   ? Primary hypercoagulable state ([place])   ? Acute pulmonary embolism without acute cor pulmonale (HCC)   ?  Deep vein thrombosis (DVT) of non-extremity vein   ? Hypokalemia   ? Acute blood loss anemia   ? Leukocytosis   ? Endotracheal tube present   ? Acute respiratory failure with hypoxemia (HCC)   ? Stroke (cerebrum) ([place]) 07/19/2019  ? Pressure injury of skin 07/19/2019  ? Acute CVA (cerebrovascular accident) ([place])   ? Encephalopathy   ? Dysphagia   ? Acute encephalopathy   ? Essential hypertension   ? Obesity 03/12/2016  ? Scalp abscess 12/20/2015  ? Neck abscess 12/20/2015  ? Pilonidal cyst 02/08/2013  ? ? ?ONSET DATE:  02/28/22 - referral date  ? ?REFERRING DIAG: CVA ? ?THERAPY DIAG:  ?Spastic hemiplegia of left nondominant side as late effect of cerebral infarction [facility]) ? ?Unsteadiness on feet ? ?Other disturbances of skin sensation ? ?Abnormal posture ? ?Stiffness of left shoulder, not elsewhere classified ? ?Muscle weakness (generalized) ? ?Apraxia ? ?Chronic left shoulder pain ? ?Other symptoms and signs involving the musculoskeletal system ? ? ?PERTINENT HISTORY: RCVA s/p hemicaniectomy 07/19/19 with abdominal flap implant.  PMH: hx of seizures and headaches s/p CVA, hx of DVT with IVC filter placement . Pt s/p botox on 01/21/22. Pt returns to occupational therapy with the following deficits: spasticity, decreased LUE functional use, decreased functional mobility  ? ?PRECAUTIONS: Fall ? ?SUBJECTIVE: The other day they called and said the pool was closed ? ?PAIN:  ?Are you having pain? No ? ? ? ? ?OBJECTIVE:  ? ? ?TODAY'S TREATMENT: ?04/07/22:   ?Patient entered and exited the pool via hydraulic lift.  Treatment occurred in 3.5-4.25 ft water.  Patient in the past, reluctant to leave surface - eg pool edge to walk across pool without another surface to hold.  Today patient able to walk across pool - very slow and deliberate pattern without UE support!  Patient indicates he and his wife have been walking in the neighborhood.  Patient with very short step length with left leg,a nd he lacks full knee and hip  extension.  In pool environment - uses an aircast to support alignment of left ankle.  Right leg fatigues with this activity. ? ?Supine with floatation equipment worked to improve lateral trunk mobility, and left shoulder and elbow passive range of motion.  Working to obtain greater shoulder abduction or flexion with elbow extended.  Today this consistently set off muscle spasm in pects.  Patient is not scheduled for next Botox until August but is on a waiting list for sooner.   ? ? ?  ?SHORT TERM GOALS: Target date: 04/14/2022 ?  ?Patient will complete aquatic exercise program designed to improve LUE ROM  ?Baseline: Has gone to [facility] with family x 1 - No formal exercise program taught to family/patient ?Goal status: INITIAL ?  ?2.  Patient will complete aquatic exercise program designed to improve functional mobility ?Baseline: Have been unable to train family member to pool env't ?Goal status: INITIAL ?  ?  ?  ?  LONG TERM GOALS: Target date:  05/24/22 ?  ?Patient will complete updated HEP in pool environment - and attend aquatic exercise weekly ?Baseline: No aquatic exercise program ?Goal status: INITIAL ?  ?2.  Patient will lift a laundry basket from below waist with BUE turn and take at least one step forward to release absket ?Baseline: Unable to carry basket two handed ?Goal status: INITIAL ?  ?3.  Patient will stand with weight on BLE and reach down to obtain dog's dish.   ?Baseline: Unable ?Goal status: INITIAL ?  ?  ?ASSESSMENT: ?  ?CLINICAL IMPRESSION: ?Patient is showing decreased fear with walking without physical assistance or UE support in aquatic environment.  Patient still with pain with elbow extension due to hypertonicity and muscle spasm.   ?  ?PERFORMANCE DEFICITS in functional skills including ADLs, IADLs, coordination, dexterity, proprioception, sensation, edema, tone, ROM, strength, and pain, cognitive skills including attention, energy/drive, perception, problem solving, safety awareness, and  sequencing, and psychosocial skills including environmental adaptation.  ?  ?IMPAIRMENTS are limiting patient from ADLs, IADLs, rest and sleep, work, and social participation.  ?  ?COMORBIDITIES has no other c

## 2022-04-14 ENCOUNTER — Ambulatory Visit: Payer: Medicaid Other | Attending: Medical | Admitting: Occupational Therapy

## 2022-04-14 ENCOUNTER — Encounter: Payer: Self-pay | Admitting: Occupational Therapy

## 2022-04-14 DIAGNOSIS — R29898 Other symptoms and signs involving the musculoskeletal system: Secondary | ICD-10-CM | POA: Diagnosis not present

## 2022-04-14 DIAGNOSIS — R208 Other disturbances of skin sensation: Secondary | ICD-10-CM | POA: Insufficient documentation

## 2022-04-14 DIAGNOSIS — I69354 Hemiplegia and hemiparesis following cerebral infarction affecting left non-dominant side: Secondary | ICD-10-CM | POA: Insufficient documentation

## 2022-04-14 DIAGNOSIS — R293 Abnormal posture: Secondary | ICD-10-CM | POA: Diagnosis not present

## 2022-04-14 DIAGNOSIS — M25612 Stiffness of left shoulder, not elsewhere classified: Secondary | ICD-10-CM | POA: Insufficient documentation

## 2022-04-14 DIAGNOSIS — R2681 Unsteadiness on feet: Secondary | ICD-10-CM | POA: Insufficient documentation

## 2022-04-14 DIAGNOSIS — M25512 Pain in left shoulder: Secondary | ICD-10-CM | POA: Diagnosis not present

## 2022-04-14 DIAGNOSIS — M6281 Muscle weakness (generalized): Secondary | ICD-10-CM | POA: Insufficient documentation

## 2022-04-14 DIAGNOSIS — R482 Apraxia: Secondary | ICD-10-CM | POA: Insufficient documentation

## 2022-04-14 DIAGNOSIS — G8929 Other chronic pain: Secondary | ICD-10-CM | POA: Insufficient documentation

## 2022-04-14 NOTE — Therapy (Signed)
OUTPATIENT OCCUPATIONAL THERAPY TREATMENT NOTE AND DISCHARGE   Patient Name: Matthew Black MRN: [id number] DOB:1976/01/18, 46 y.o., male Today's Date: 04/14/2022  PCP: Mackie Pai, PA-C REFERRING PROVIDER: Leeroy Cha  END OF SESSION:   OT End of Session - 04/14/22 2004     Visit Number 3    Number of Visits 9    Date for OT Re-Evaluation 05/24/22    Authorization Type Medicaid[employer] [place] auth    OT Start Time 1545    OT Stop Time 1630    OT Time Calculation (min) 45 min    Equipment Utilized During Treatment floatation, ankle brace    Activity Tolerance Patient tolerated treatment well    Behavior During Therapy [facility] for tasks assessed/performed             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   Depression    DVT (deep venous thrombosis) ([place])    BLE DVT 07/21/19, 08/02/19; s/p retrievable IVC filter 07/21/19   Hemorrhagic stroke ([place])    History of kidney stones    Hypertension    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   PE (pulmonary thromboembolism) ([place])    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   Stroke ([place])    RICA, R A1, R MCA occlusion 07/19/19   Past Surgical History:  Procedure Laterality Date   CRANIOPLASTY Right 06/05/2020   Procedure: CRANIOPLASTY;  Surgeon: Consuella Lose, MD;  Location: [place];  Service: Neurosurgery;  Laterality: Right;  right   CRANIOTOMY Right 07/19/2019   Procedure: RIGHT HEMI-CRANIECTOMY With implantation of skull flap to abdominal wall;  Surgeon: Consuella Lose, MD;  Location: [place];  Service: Neurosurgery;  Laterality: Right;   CYST EXCISION N/A 10/08/2016   Procedure: EXCISION OF POSTERIOR NECK CYST;  Surgeon: Clovis Riley, MD;  [facility];  Service: General;  Laterality: N/A;   CYSTOSCOPY/URETEROSCOPY/HOLMIUM LASER/STENT PLACEMENT Right 03/16/2020   Procedure: CYSTOSCOPY RIGHT RETROGRADE PYELOGRAM URETEROSCOPY/HOLMIUM LASER/STENT PLACEMENT;   Surgeon: Lucas Mallow, MD;  [facility];  Service: Urology;  Laterality: Right;   INCISION AND DRAINAGE ABSCESS N/A 09/22/2014   Procedure: INCISION AND DRAINAGE ABSCESS POSTERIOR NECK;  Surgeon: Pedro Earls, MD;  [facility];  Service: General;  Laterality: N/A;   INCISION AND DRAINAGE ABSCESS N/A 12/20/2015   Procedure: INCISION AND DRAINAGE POSTERIOR NECK MASS;  Surgeon: Armandina Gemma, MD;  [facility];  Service: General;  Laterality: N/A;   INCISION AND DRAINAGE ABSCESS Left 07/10/2004   middle finger   IR IVC FILTER PLMT / S&I /IMG GUID/MOD SED  07/21/2019   IR RADIOLOGIST EVAL & MGMT  12/14/2019   IR RADIOLOGIST EVAL & MGMT  12/21/2020   IR VENOGRAM RENAL UNI RIGHT  07/21/2019   MASS EXCISION N/A 07/21/2017   Procedure: EXCISION OF BENIGN NECK LESION WITH LAYERED CLOSURE;  Surgeon: Irene Limbo, MD;  Location: [place];  Service: Plastics;  Laterality: N/A;   MASS EXCISION N/A 11/10/2017   Procedure: EXCISION BENIGN LESION OF THE NECK WITH LAYERED CLOSURE;  Surgeon: Irene Limbo, MD;  Location: [place];  Service: Plastics;  Laterality: N/A;   Patient Active Problem List   Diagnosis Date Noted   COVID-19 virus infection 04/21/2021   Left-sided weakness 04/21/2021   S/P craniotomy 06/05/2020   History of cranioplasty 06/05/2020   Peri-rectal abscess 02/14/2020   Abnormal CT scan, pelvis 02/14/2020   Pancytopenia (  [place]) 12/02/2019   Nephrolithiasis 12/02/2019   Hydronephrosis with renal and ureteral calculus obstruction 12/02/2019   Rectal pain 12/02/2019   Hyperkalemia 12/02/2019   Reactive depression    Wound infection after surgery    Sleep disturbance    Dysphagia, post-stroke    Transaminitis    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   Cerebral abscess    Urinary tract infection without hematuria    Altered mental status    Primary hypercoagulable state ([place])    Acute pulmonary embolism without  acute cor pulmonale (HCC)    Deep vein thrombosis (DVT) of non-extremity vein    Hypokalemia    Acute blood loss anemia    Leukocytosis    Endotracheal tube present    Acute respiratory failure with hypoxemia (HCC)    Stroke (cerebrum) ([place]) 07/19/2019   Pressure injury of skin 07/19/2019   Acute CVA (cerebrovascular accident) ([place])    Encephalopathy    Dysphagia    Acute encephalopathy    Essential hypertension    Obesity 03/12/2016   Scalp abscess 12/20/2015   Neck abscess 12/20/2015   Pilonidal cyst 02/08/2013    ONSET DATE:  02/28/22 - referral date   REFERRING DIAG: CVA  THERAPY DIAG:  Unsteadiness on feet  Other disturbances of skin sensation  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  PMH: hx of seizures and headaches s/p CVA, hx of DVT with IVC filter placement . Pt s/p botox on 01/21/22. Pt returns to occupational therapy with the following deficits: spasticity, decreased LUE functional use, decreased functional mobility   PRECAUTIONS: Fall  SUBJECTIVE: The other day they called and said the pool was closed  PAIN:  Are you having pain? No     OBJECTIVE:    TODAY'S TREATMENT: 04/14/22 Patient entered and exited the pool via hydraulic lift.  Treatment occurred in 3.5-4.25 ft water. Patient without pain at start of session, but reports pain with all stretching activity.  Patient able to walk initially with one hand held assist with flexed left knee and hip, and without heel contact.  Patient with ankle support on LLE to promote improved alignment.   In seated on underwater bench worked to improve sit to stand transition  improving postural control and weight shift toward left.  Patient with improved ability to activate toward extension in hip and knee.  Worked on seated weight shift to left with stretch to left shoulder, and left hip and knee.   Long discussion with patient who was experiencing significant discomfort with even gentle realignment.  Patient is scheduled for Botox injection in August.  At this time it may be most advantageous to stop therapy at this time to allow therapy to reconvene 7-10 days post Botox.   Patient aware and will plan to reschedule as indicated.     04/07/22:   Patient entered and exited the pool via hydraulic lift.  Treatment occurred in 3.5-4.25 ft water.  Patient in the past, reluctant to leave surface - eg pool edge to walk across pool without another surface to hold.  Today patient able to walk across pool - very slow and deliberate pattern without UE support!  Patient indicates he and his wife  have been walking in the neighborhood.  Patient with very short step length with left leg,a nd he lacks full knee and hip extension.  In pool environment - uses an aircast to support alignment of left ankle.  Right leg fatigues with this activity.  Supine with floatation equipment worked to improve lateral trunk mobility, and left shoulder and elbow passive range of motion.  Working to obtain greater shoulder abduction or flexion with elbow extended.  Today this consistently set off muscle spasm in pects.  Patient is not scheduled for next Botox until August but is on a waiting list for sooner.       SHORT TERM GOALS: Target date: 04/14/2022   Patient will complete aquatic exercise program designed to improve LUE ROM  Baseline: Has gone to [facility] with family x 1 - No formal exercise program taught to family/patient Goal status: Not met   2.  Patient will complete aquatic exercise program designed to improve functional mobility Baseline: Have been unable to train family member to pool  env't Goal status: Not met       LONG TERM GOALS: Target date:  05/24/22   Patient will complete updated HEP in pool environment - and attend aquatic exercise weekly Baseline: No aquatic exercise program Goal status: Not met   2.  Patient will lift a laundry basket from below waist with BUE turn and take at least one step forward to release basket Baseline: Unable to carry basket two handed Goal status: Not met   3.  Patient will stand with weight on BLE and reach down to obtain dog's dish.   Baseline: Unable Goal status: Not met     ASSESSMENT:   CLINICAL IMPRESSION: Patient is showing decreased fear with walking without physical assistance or UE support in aquatic environment.  Patient is experiencing increased muscle tension and pain, therefore will stop therapy earlier than originally anticipated to allow patient to receive next round of Botox injections and return.     PERFORMANCE DEFICITS in functional skills including ADLs, IADLs, coordination, dexterity, proprioception, sensation, edema, tone, ROM, strength, and pain, cognitive skills including attention, energy/drive, perception, problem solving, safety awareness, and sequencing, and psychosocial skills including environmental adaptation.    IMPAIRMENTS are limiting patient from ADLs, IADLs, rest and sleep, work, and social participation.    COMORBIDITIES has no other co-morbidities that affects occupational performance. Patient will benefit from skilled OT to address above impairments and improve overall function.   MODIFICATION OR ASSISTANCE TO COMPLETE EVALUATION: No modification of tasks or assist necessary to complete an evaluation.   OT OCCUPATIONAL PROFILE AND HISTORY: Problem focused assessment: Including review of records relating to presenting problem.   CLINICAL DECISION MAKING: Moderate - several treatment options, min-mod task modification necessary   REHAB POTENTIAL: Good   EVALUATION COMPLEXITY:  Moderate     PLAN: OT FREQUENCY: 1x/week   OT DURATION: 8 visits over 10 weeks   PLANNED INTERVENTIONS: self care/ADL training, therapeutic exercise, therapeutic activity, neuromuscular re-education, manual therapy, balance training, functional mobility training, and aquatic therapy   RECOMMENDED OTHER SERVICES: PT   CONSULTED AND AGREED WITH PLAN OF CARE: Patient   PLAN FOR NEXT SESSION: d/c    Mariah Milling, OT 03/10/2022, 6:22 PM                               Mariah Milling, OT 04/14/2022, 8:06 PM

## 2022-04-22 ENCOUNTER — Telehealth: Payer: Self-pay

## 2022-04-22 NOTE — Telephone Encounter (Signed)
Patient aware that Dr. Ranell Patrick is out of the office this afternoon.   Matthew Black is wanting to know how often should he be taking his blood pressure medication?    He said he need a Botox injection for Left shoulder pain. He stated it hurts so bad its sometimes hard to get out of bed. (He denies nausea, chest pain & dizziness).    He would like to speak to Dr. Ranell Patrick [phone]. Thank you.

## 2022-04-23 ENCOUNTER — Encounter
Payer: Medicaid Other | Attending: Physical Medicine and Rehabilitation | Admitting: Physical Medicine and Rehabilitation

## 2022-04-23 ENCOUNTER — Encounter: Payer: Self-pay | Admitting: Physical Medicine and Rehabilitation

## 2022-04-28 ENCOUNTER — Telehealth: Payer: Self-pay | Admitting: *Deleted

## 2022-04-28 NOTE — Telephone Encounter (Signed)
Caller Name Matthew Black Phone Number [phone] Patient Name Matthew Black Patient DOB 07-04-1976 Call Type Message Only Information Provided Reason for Call Request to Schedule Office Appointment Initial Comment Caller states he has paperwork he need provider to fill out. Patient request to speak to RN No Additional Comment Declined triage. Disp. Time Disposition Final User 04/28/2022 7:54:25 AM General Information Provided Yes Linda Hedges, Ignacia Palma

## 2022-04-29 ENCOUNTER — Telehealth: Payer: Self-pay

## 2022-04-29 NOTE — Telephone Encounter (Signed)
Patient called to get a return call from Dr. Ranell Patrick

## 2022-04-30 ENCOUNTER — Ambulatory Visit: Payer: Medicaid Other | Admitting: Medical

## 2022-04-30 ENCOUNTER — Encounter
Payer: Medicaid Other | Attending: Physical Medicine and Rehabilitation | Admitting: Physical Medicine and Rehabilitation

## 2022-04-30 VITALS — BP 116/63 | HR 76 | Resp 18 | Ht 71.0 in | Wt 240.0 lb

## 2022-04-30 DIAGNOSIS — R739 Hyperglycemia, unspecified: Secondary | ICD-10-CM

## 2022-04-30 DIAGNOSIS — I10 Essential (primary) hypertension: Secondary | ICD-10-CM

## 2022-04-30 DIAGNOSIS — Z8673 Personal history of transient ischemic attack (TIA), and cerebral infarction without residual deficits: Secondary | ICD-10-CM

## 2022-04-30 DIAGNOSIS — E7849 Other hyperlipidemia: Secondary | ICD-10-CM | POA: Diagnosis not present

## 2022-04-30 DIAGNOSIS — I69398 Other sequelae of cerebral infarction: Secondary | ICD-10-CM | POA: Insufficient documentation

## 2022-04-30 DIAGNOSIS — M25519 Pain in unspecified shoulder: Secondary | ICD-10-CM | POA: Insufficient documentation

## 2022-04-30 DIAGNOSIS — R252 Cramp and spasm: Secondary | ICD-10-CM | POA: Insufficient documentation

## 2022-04-30 NOTE — Patient Instructions (Addendum)
History of stroke with left side deficits but fully functioning right upper and lower extremity.  5 out of 5 strength and normal coordination.  Patient reports has been driving automatic vehicle with no issues.  No vision problems reported.  For range of motion of the neck.  Recently received a form informed from [facility] verifying that it is safe for patient to drive. Return date  passed and he knows not to drive until paperwork completed.   Referral back to neurologist to fill out neurologic section. See specific on referral. Gave pt copy of that blank page. I will keep packet and fill out rest once neurologist give opinion.  Also provided emotional and muskuloskelatal. These may need to be fill out depending on how neuroform filled out.  Htn- well controlled with lopressor.  High choleserol- well controlled with lipitor.  Follow up late summer early fall for wellness exam to repeat fasting labs. Sooner if needed

## 2022-04-30 NOTE — Progress Notes (Signed)
Subjective:    Patient ID: Matthew Black, male    DOB: 1976/08/05, 46 y.o.   MRN: [id number]  HPI  Pt in for follow up.  He states got a form from [facility]. Needs to make sure safe to drive.  Pt has been driving. Fully functional rt upper and rt lower extremity. He drives automatic vehicle.  Has left upper extremity decreased grip strength.  Pt stroke was august 2020.    Review of Systems  Constitutional:  Negative for chills, fatigue and fever.  Respiratory:  Negative for cough, chest tightness, shortness of breath and wheezing.   Cardiovascular:  Negative for chest pain and palpitations.  Gastrointestinal:  Negative for abdominal pain and blood in stool.  Genitourinary:  Negative for dysuria, flank pain and frequency.  Musculoskeletal:  Negative for back pain.  Neurological:  Negative for syncope, facial asymmetry, light-headedness and headaches.  Hematological:  Negative for adenopathy.    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   PE (pulmonary thromboembolism) ([place])    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   Stroke (HCC)    RICA, R A1, R MCA occlusion 07/19/19     Social History   Socioeconomic History   Marital status: Married    Spouse name: Melene Muller   Number of children: 2   Years of education: Not on file   Highest education level: Not on file  Occupational History   Occupation: unemployed 01/30/21  Tobacco Use   Smoking status: Former    Packs/day: 0.00    Types: Cigarettes    Quit date: 11/24/2015    Years since quitting: 6.4   Smokeless tobacco: Never   Tobacco comments:       Vaping Use   Vaping Use: Never used  Substance and Sexual Activity   Alcohol use: Yes    Comment:  socially   Drug use: Never   Sexual activity: Not Currently  Other Topics Concern   Not on file  Social History Narrative   Lives with wife and kids   Right Handed   Drinks >10 cups caffeine daily   Social Determinants of Health   Financial Resource Strain: Not on file  Food Insecurity: Not on file  Transportation Needs: Not on file  Physical Activity: Not on file  Stress: Not on file  Social Connections: Not on file  Intimate Partner Violence: Not on file    Past Surgical History:  Procedure Laterality Date   CRANIOPLASTY Right 06/05/2020   Procedure: CRANIOPLASTY;  Surgeon: Consuella Lose, MD;  Location: [place];  Service: Neurosurgery;  Laterality: Right;  right   CRANIOTOMY Right 07/19/2019   Procedure: RIGHT HEMI-CRANIECTOMY With implantation of skull flap to abdominal wall;  Surgeon: Consuella Lose, MD;  Location: [place];  Service: Neurosurgery;  Laterality: Right;   CYST EXCISION N/A 10/08/2016   Procedure: EXCISION OF POSTERIOR NECK CYST;  Surgeon: Clovis Riley, MD;  [facility];  Service: General;  Laterality: N/A;   CYSTOSCOPY/URETEROSCOPY/HOLMIUM LASER/STENT PLACEMENT Right 03/16/2020   Procedure: CYSTOSCOPY RIGHT RETROGRADE PYELOGRAM URETEROSCOPY/HOLMIUM LASER/STENT PLACEMENT;  Surgeon: Lucas Mallow, MD;  [facility];  Service: Urology;  Laterality:  Right;   INCISION AND DRAINAGE ABSCESS N/A 09/22/2014   Procedure: INCISION AND DRAINAGE ABSCESS POSTERIOR NECK;  Surgeon: Pedro Earls, MD;  [facility];  Service: General;  Laterality: N/A;   INCISION AND DRAINAGE ABSCESS N/A 12/20/2015   Procedure: INCISION AND DRAINAGE POSTERIOR NECK MASS;  Surgeon: Armandina Gemma, MD;  [facility];  Service: General;  Laterality: N/A;   INCISION AND DRAINAGE ABSCESS Left 07/10/2004   middle finger   IR IVC FILTER PLMT / S&I /IMG GUID/MOD SED  07/21/2019   IR RADIOLOGIST EVAL & MGMT  12/14/2019   IR RADIOLOGIST EVAL & MGMT  12/21/2020   IR VENOGRAM RENAL  UNI RIGHT  07/21/2019   MASS EXCISION N/A 07/21/2017   Procedure: EXCISION OF BENIGN NECK LESION WITH LAYERED CLOSURE;  Surgeon: Irene Limbo, MD;  Location: [place];  Service: Plastics;  Laterality: N/A;   MASS EXCISION N/A 11/10/2017   Procedure: EXCISION BENIGN LESION OF THE NECK WITH LAYERED CLOSURE;  Surgeon: Irene Limbo, MD;  Location: [place];  Service: Plastics;  Laterality: N/A;    Family History  Problem Relation Age of Onset   Healthy Mother    Healthy Father    Clotting disorder Maternal Grandmother    Diabetes Paternal Grandfather    Clotting disorder Maternal Aunt    Clotting disorder Maternal Uncle    Colon cancer Neg Hx     Allergies  Allergen Reactions   Hydrocodone Nausea Only    Current Outpatient Medications on File Prior to Visit  Medication Sig Dispense Refill   aspirin (ASPIRIN LOW DOSE) 81 MG EC tablet TAKE 1 TABLET (81 MG TOTAL) BY MOUTH DAILY. SWALLOW WHOLE. 90 tablet 1   atorvastatin (LIPITOR) 10 MG tablet TAKE 1 TABLET BY MOUTH EVERY DAY 90 tablet 1   baclofen (LIORESAL) 10 MG tablet TAKE 1 TABLET BY MOUTH THREE TIMES A DAY 270 tablet 1   metoprolol tartrate (LOPRESSOR) 50 MG tablet Take 1 tablet (50 mg total) by mouth 2 (two) times daily. 180 tablet 1   No current facility-administered medications on file prior to visit.    BP 116/63   Pulse 76   Resp 18   Ht '5\' 11"'$  (1.803 m)   Wt 240 lb (108.9 kg)   SpO2 98%   BMI 33.47 kg/m       Objective:   Physical Exam  General Mental Status- Alert. General Appearance- Not in acute distress.    Skin General: Color- Normal Color. Moisture- Normal Moisture.   Neck Carotid Arteries- Normal color. Moisture- Normal Moisture. No carotid bruits. No JVD.   Chest and Lung Exam Auscultation: Breath Sounds:-Normal.   Cardiovascular Auscultation:Rythm- Regular. Murmurs & Other Heart Sounds:Auscultation of the heart reveals- No Murmurs.    Abdomen Inspection:-Inspeection Normal. Palpation/Percussion:Note:No mass. Palpation and Percussion of the abdomen reveal- Non Tender, Non Distended + BS, no rebound or guarding.   Neurologic Cranial Nerve exam:- CN III-XII intact(No nystagmus), symmetric smile. Drift Test:- No drift. Romberg Exam:- Negative.  Heal to Toe Gait exam:-Normal. Finger to Nose:- Normal/Intact Strength:- rt upper and lower ext normal. Left upper and lower ext- 1/5 weakness. Minimal range of motion.       Assessment & Plan:   Patient Instructions  History of stroke with left side deficits but fully functioning right upper and lower extremity.  5 out of 5 strength and normal coordination.  Patient reports has been driving automatic vehicle with no issues.  No vision problems reported.  For range of motion of the neck.  Recently received a form informed from [facility] verifying that it is safe for patient to drive. Return date  passed and he knows not to drive until paperwork completed.   Referral back to neurologist to fill out neurologic section. See specific on referral. Gave pt copy of that blank page. I will keep packet and fill out rest once neurologist give opinion.  Also provided emotional and muskuloskelatal. These may need to be fill out depending on how neuroform filled out.  Htn- well controlled with lopressor.  High choleserol- well controlled with lipitor.  Follow up late summer early fall for wellness exam to repeat fasting labs. Sooner if needed   [employer], [employer]

## 2022-05-01 ENCOUNTER — Ambulatory Visit (INDEPENDENT_AMBULATORY_CARE_PROVIDER_SITE_OTHER): Payer: Medicaid Other | Admitting: Adult Health

## 2022-05-01 ENCOUNTER — Encounter: Payer: Self-pay | Admitting: Adult Health

## 2022-05-01 ENCOUNTER — Telehealth: Payer: Self-pay | Admitting: Adult Health

## 2022-05-01 VITALS — BP 121/81 | HR 72 | Ht 72.0 in

## 2022-05-01 DIAGNOSIS — I639 Cerebral infarction, unspecified: Secondary | ICD-10-CM

## 2022-05-01 DIAGNOSIS — R531 Weakness: Secondary | ICD-10-CM

## 2022-05-01 DIAGNOSIS — R269 Unspecified abnormalities of gait and mobility: Secondary | ICD-10-CM

## 2022-05-01 DIAGNOSIS — I69398 Other sequelae of cerebral infarction: Secondary | ICD-10-CM

## 2022-05-01 NOTE — Progress Notes (Signed)
[facility] [address] [place]. [place] [address] [phone]       OFFICE FOLLOW-UP NOTE  Matthew Black Date of Birth:  1975-12-17 Medical Record Number:  [id number]    Primary neurologist: Dr. Leonie Man Reason for visit: Stroke follow-up  Chief Complaint  Patient presents with   Follow-up    RM 3 with takina Pt is well and stable, no new concerns       HPI:   Update 05/01/2022 JM: Patient requested today's visit requesting completion of DMV forms.  He is accompanied by his wife.  He was previously seen 1 year ago and no showed follow-up visit back in October.  Continued left spastic hemiparesis, reports continued improvement since prior visit, currently working with OT and PMR with continued Botox injections.  Denies any residual visual impairment.  Recently seen by eye doctor with full vision exam, was told he needs glasses but no other findings.  Ambulates with four-point cane, no recent falls.  He has since returned back to driving but now needs DMV clearance forms completed.  He has had no difficulty with driving.  Denies new stroke/TIA symptoms.  Compliant on aspirin and atorvastatin, denies side effects.  Blood pressure today 121/81.  Routinely follows with PCP.  No new concerns at this time.      History provided for reference purposes only Update 05/22/2021 JM: Matthew Black returns for 4-monthstroke follow-up unaccompanied.  His father brought him to visit but waited in the waiting room.  Reports continued left spastic hemiparesis routinely followed by Dr. RRanell Patrickreceiving Botox injections.  Denies improvement but denies worsening.  Able to ambulate with cane short distances and use of AFO brace.  He did have a fall on 5/28 while walking upstairs and reported his leg gave out.  Evaluated in the ED and found to be positive for COVID-19 and possible pneumonia.  MRI and CT head negative for acute stroke.  [facility] but [facility]  reports he was never contacted to initiate.  He has not had any additional falls since that time.  Denies new stroke/TIA symptoms.  Compliant on aspirin and atorvastatin without associated side effects.  Blood pressure today 129/84.  No further concerns at this time.   Initial visit 01/30/2021 Dr. SLeonie Man Matthew Black a 46year old African-American male seen today for initial office follow-up visit following hospital admission for stroke in August 2020.  He is accompanied by his wife.  History is obtained from them, review of electronic medical records and personally reviewed available imaging films in PACS.  Patient has past medical stay of hypertension and kidney stones.  [facility] non[facility]bal [facility] morning with left hemiplegia.  The wife left at 8:30 in the morning when the patient seemed fine.  She did not hear back from him the rest of the day.  He was found the next day at home with dried vomit around his mouth and covered in urine.  Urine drug screen was positive for cannabis.  CT scan of the head on admission showed a large right hemispheric infarct with cytotoxic edema involving the right anterior cerebral and middle cerebral artery territories with mild mass-effect and no hemorrhage.  MRA showed complete occlusion of the right internal carotid artery right anterior cerebral and middle cerebral arteries.  He was intubated for airway protection and started on hypertonic saline.  He underwent decompressive right hemicraniectomy to me by  Dr. Kathyrn Sheriff on 07/19/2019.  His hospital course was complicated by hemorrhagic conversion with hematoma 07/30/2019 as well as further clinical worsening on 08/14/2019.Marland Kitchen He had a prolonged hospital course which was complicated by lower extremity DVT in the right peroneal, left popliteal and left posterior tibial and left peroneal veins.  TCD bubble study was negative for PFO.  LDL cholesterol is 83 mg percent.  Urine  drug screen was positive for cannabis.  Hemoglobin A1c was 5.4.  Hypercoagulable panel and autoimmune work-up was negative.  He was started on IV heparin for DVT but after he developed hemorrhagic transformation it had to be temporarily stopped.  Follow-up CT scan showed irregular collections beneath the craniectomy site with question of disruption of dura with CSF leak or possible infection.  MRI on 08/20/2019 showed extremely heavy herniation of the brain into the craniectomy site with large irregular enhancing fluid collection along the anterior margin of the craniectomy site measuring 5.4 x 9.8 cm which could represent possible abscess versus sterile hematoma versus infected hematoma.  This fluid collection was aspirated and sent for cultures.  No growth was found.  Patient had initial IV heparin started but when he developed hemorrhagic transformation IVC filter was placed on 07/21/2019 with plan for retrieval later.  Chest CT scan also showed nonocclusive thrombus in the left posterior lower lobe pulmonary segmental artery.  He also developed abdominal wall hematoma which was followed conservatively.  He had possible seizure-like activity and twitchings but long-term EEG monitoring showed right frontal dysfunction but no definite seizure activity.  He was placed on Keppra for short-term.  He developed a UTI which grew Pseudomonas which was treated with antibiotics.  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  . Patient is head outpatient follow-up with rehab clinic in is currently getting Botox injections and baclofen for his spasticity.  He is able to ambulate independently using a quad cane and has a left ankle-foot brace.  He has not obtained any significant improvement in his left hand which is contracted at the left wrist and fingers but he is able to move his leg much better except he has a foot drop.  He is currently not on aspirin.  He had follow-up lower extremity venous  Doppler on 01/25/2021 which showed no evidence of DVT.    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   Depression    DVT (deep venous thrombosis) ([place])    BLE DVT 07/21/19, 08/02/19; s/p retrievable IVC filter 07/21/19   Hemorrhagic stroke ([place])    History of kidney stones    Hypertension    Paralysis ([place])    LEFT SIDE   PE (pulmonary thromboembolism) ([place])    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   Stroke ([place])    RICA, R A1, R MCA occlusion 07/19/19    Social History:  Social History   Socioeconomic History   Marital status: Married    Spouse name: Melene Muller   Number of children: 2   Years of education: Not on file   Highest education level: Not on file  Occupational History   Occupation: unemployed 01/30/21  Tobacco Use   Smoking status: Former    Packs/day: 0.00    Types: Cigarettes    Quit date: 11/24/2015    Years since  quitting: 6.4   Smokeless tobacco: Never   Tobacco comments:       Vaping Use   Vaping Use: Never used  Substance and Sexual Activity   Alcohol use: Yes    Comment: socially   Drug use: Never   Sexual activity: Not Currently  Other Topics Concern   Not on file  Social History Narrative   Lives with wife and kids   Right Handed   Drinks >10 cups caffeine daily   Social Determinants of Health   Financial Resource Strain: Not on file  Food Insecurity: Not on file  Transportation Needs: Not on file  Physical Activity: Not on file  Stress: Not on file  Social Connections: Not on file  Intimate Partner Violence: Not on file    Medications:   Current Outpatient Medications on File Prior to Visit  Medication Sig Dispense Refill   aspirin (ASPIRIN LOW DOSE) 81 MG EC tablet TAKE 1 TABLET (81 MG TOTAL) BY MOUTH DAILY. SWALLOW WHOLE. 90 tablet 1   atorvastatin (LIPITOR) 10 MG  tablet TAKE 1 TABLET BY MOUTH EVERY DAY 90 tablet 1   baclofen (LIORESAL) 10 MG tablet TAKE 1 TABLET BY MOUTH THREE TIMES A DAY 270 tablet 1   metoprolol tartrate (LOPRESSOR) 50 MG tablet Take 1 tablet (50 mg total) by mouth 2 (two) times daily. 180 tablet 1   No current facility-administered medications on file prior to visit.    Allergies:   Allergies  Allergen Reactions   Hydrocodone Nausea Only    Physical Exam Today's Vitals   05/01/22 1342  BP: 121/81  Pulse: 72  Height: 6' (1.829 m)   Body mass index is 32.55 kg/m.  General: well developed, well nourished pleasant middle-aged African-American male, seated, in no evident distress Head: head normocephalic and atraumatic.  Neck: supple with no carotid or supraclavicular bruits Cardiovascular: regular rate and rhythm, no murmurs Musculoskeletal: no deformity Skin:  no rash/petichiae Vascular:  Normal pulses all extremities  Neurologic Exam Mental Status: Awake and fully alert.  Occasional speech hesitancy but no clear dysarthria or aphasia.  Oriented to place and time. Recent and remote memory intact. Attention span, concentration and fund of knowledge appropriate. Mood and affect flat.  Cranial Nerves: Pupils equal, briskly reactive to light. Extraocular movements full without nystagmus. Visual fields full show left partial homonymous hemianopsia to confrontation. Hearing intact. Facial sensation intact.  Mild left lower facial weakness., tongue, palate moves normally and symmetrically.  Motor: Normal strength, bulk and tone right upper and lower extremity LUE: 3/5 deltoid; 3/5 EE and EF; 1/5 hand with increased tone  LLE:  4-/5 HF; 4/5 KE and KF; 1/5 ADF with AFO in place  Sensory.:  Intact to light touch, vibratory and pinprick sensation Coordination: Rapid alternating movements performed accurately right side. Finger-to-nose and heel-to-shin performed accurately right side. Gait and Station: Stands from seated position  with mild difficulty.  Stance is slightly hunched.  Gait demonstrates hemiplegic gait with circumduction and use of four-point cane.  Tandem walking heel toe not attempted. Reflexes: 1+ and symmetric. Toes downgoing.      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  Etiology of carotid occlusion unclear possibly dissection.  He had a prolonged hospital admission with several complications including DVT, pulmonary embolism, hemorrhagic transformation, abdominal  wall hematoma, UTI but made quite remarkable recovery     PLAN:  -will complete DMV forms - he does have left sided weakness but no weakness or abnormality on right side.  He was already driving prior to receiving these forms and did not have any difficulty.  He was cleared by ophthalmology for driving. -Encouraged continued participation with outpatient therapies -Continue to follow with PMR for spasticity management with Botox as well as left shoulder pain -Continue aspirin and atorvastatin 10 mg daily for secondary stroke prevention -Continue to follow with PCP for aggressive stroke risk factor management including HTN with BP goal<130/90 and HLD with LDL goal<70   Doing well from stroke standpoint without further recommendations and risk factors are managed by PCP. He may follow up PRN, as usual for our patients who are strictly being followed for stroke. If any new neurological issues should arise, request PCP place referral for evaluation by one of our neurologists. Thank you.     CC:  Saguier, Iris Pert   I spent 34 minutes of face-to-face and non-face-to-face time with patient and wife.  This included previsit chart review, lab review, study review, electronic  health record documentation, patient and wife education and discussion regarding prior stroke with residual deficits and secondary stroke prevention measures and aggressive stroke risk factor management, and answered all other questions to patient and wife's satisfaction   Frann Rider, AGNP-BC  [facility] [address] [place], [place] [address]  Phone [phone] Fax [phone] Note: This document was prepared with digital dictation and possible smart phrase technology. Any transcriptional errors that result from this process are unintentional.

## 2022-05-01 NOTE — Telephone Encounter (Signed)
Completed DMV forms per patient request.  Placed in inbox.  Please fax forms to PCP office per patient and wife request. Please call patient/wife to pick up forms at their convenience.  Thank you.

## 2022-05-01 NOTE — Telephone Encounter (Signed)
Done copy at front desk for pick up and copy faxed to pt pcp.

## 2022-05-01 NOTE — Progress Notes (Signed)
Left message for patient with my phone number for call back 

## 2022-05-01 NOTE — Telephone Encounter (Signed)
Forms received, sent to Hilda Blades in medical records for processing and payment prior to faxing.

## 2022-05-01 NOTE — Patient Instructions (Signed)
Continue aspirin 81 mg daily  and atorvastatin  for secondary stroke prevention  Continue to follow up with PCP regarding cholesterol and blood pressure management  Maintain strict control of hypertension with blood pressure goal below 130/90 and cholesterol with LDL cholesterol (bad cholesterol) goal below 70 mg/dL.   Signs of a Stroke? Follow the BEFAST method:  Balance Watch for a sudden loss of balance, trouble with coordination or vertigo Eyes Is there a sudden loss of vision in one or both eyes? Or double vision?  Face: Ask the person to smile. Does one side of the face droop or is it numb?  Arms: Ask the person to raise both arms. Does one arm drift downward? Is there weakness or numbness of a leg? Speech: Ask the person to repeat a simple phrase. Does the speech sound slurred/strange? Is the person confused ? Time: If you observe any of these signs, call 911.       Thank you for coming to see us at [facility]. I hope we have been able to provide you high quality care today.  You may receive a patient satisfaction survey over the next few weeks. We would appreciate your feedback and comments so that we may continue to improve ourselves and the health of our patients.  

## 2022-05-05 ENCOUNTER — Telehealth: Payer: Self-pay

## 2022-05-05 NOTE — Telephone Encounter (Signed)
Called Pt to get paper fax over to [place] to finish the paperwork for Department of [employer] Veh[employer]

## 2022-05-05 NOTE — Telephone Encounter (Signed)
Pt calling to speak with Jarrett Soho or Percell Miller his paperwork. He can be reached at [phone]

## 2022-05-05 NOTE — Telephone Encounter (Signed)
Forms received  Placed in saguier bin up front

## 2022-05-05 NOTE — Telephone Encounter (Signed)
Matthew Black has forms.

## 2022-05-06 NOTE — Telephone Encounter (Signed)
Called Pt and was advised, need to come by sign papers

## 2022-05-06 NOTE — Telephone Encounter (Signed)
I did

## 2022-05-06 NOTE — Telephone Encounter (Signed)
Patient called for update on papers.  Advised patient awaiting providers response. Patient states he needs the papers ASAP

## 2022-05-06 NOTE — Telephone Encounter (Addendum)
Pt called again regarding forms- I informed that we are working on forms- and can take 5-7 business days to complete, Pt requested to schedule visit to discuss. Appt scheduled 05/07/22.

## 2022-05-07 ENCOUNTER — Telehealth: Payer: Self-pay | Admitting: Adult Health

## 2022-05-07 ENCOUNTER — Ambulatory Visit: Payer: Medicaid Other | Admitting: Medical

## 2022-05-07 NOTE — Telephone Encounter (Signed)
Do you know what card he is referring to? Michela Pitcher you could give him a card verifying that he had a stroke

## 2022-05-07 NOTE — Telephone Encounter (Signed)
Yes and sent to  scan

## 2022-05-07 NOTE — Telephone Encounter (Signed)
Pt said, Janett Billow, NP discussed at last office visit to give a card to verify pt had had a stroke.Would like a call from the nurse.

## 2022-05-08 NOTE — Telephone Encounter (Signed)
Yes, I have been trying to look up cards that he can keep with him stating he has had a stroke and what his residual deficits are. I am only able to find cards for stroke patients with language difficulties. There are medical cards that he can purchase online (like a medical ID bracelet/card) but was unable to find a basic card to print out for him. We can also write him a letter for him to keep in his car if he wishes (in case he should get pulled over). Thank you.

## 2022-05-16 ENCOUNTER — Other Ambulatory Visit: Payer: Self-pay | Admitting: Neurology

## 2022-05-22 ENCOUNTER — Encounter (HOSPITAL_BASED_OUTPATIENT_CLINIC_OR_DEPARTMENT_OTHER): Payer: Medicaid Other | Admitting: Physical Medicine and Rehabilitation

## 2022-05-22 ENCOUNTER — Telehealth: Payer: Self-pay | Admitting: Neurology

## 2022-05-22 ENCOUNTER — Telehealth: Payer: Self-pay

## 2022-05-22 ENCOUNTER — Encounter: Payer: Self-pay | Admitting: *Deleted

## 2022-05-22 DIAGNOSIS — I69398 Other sequelae of cerebral infarction: Secondary | ICD-10-CM

## 2022-05-22 DIAGNOSIS — R252 Cramp and spasm: Secondary | ICD-10-CM

## 2022-05-22 DIAGNOSIS — I10 Essential (primary) hypertension: Secondary | ICD-10-CM

## 2022-05-22 MED ORDER — METOPROLOL TARTRATE 50 MG PO TABS: 50.0000 mg | ORAL_TABLET | Freq: Two times a day (BID) | ORAL | 3 refills | Status: DC

## 2022-05-22 MED ORDER — TIZANIDINE HCL 4 MG PO TABS: 4.0000 mg | ORAL_TABLET | Freq: Every evening | ORAL | 3 refills | Status: AC | PRN

## 2022-05-22 NOTE — Telephone Encounter (Signed)
Please contact patient. He does have left-sided spasticity which may have contributed but unable to say for sure. Did tremor occur in setting of muscle activation or occur when sitting and relaxed? How long did it last for? Has this happened previously? If not, when were symptoms first noticed and have they been gradually worsening?

## 2022-05-22 NOTE — Telephone Encounter (Signed)
Called patient, unable to  LVM, voice MB full.   Sent my chart with NP's questions.

## 2022-05-22 NOTE — Telephone Encounter (Signed)
Late entry, patient called on-call physician on June 25 11:26 AM, complains of left side tremor, I failed to reach patient multiple attempts  MRI of the brain in May 2022 showed large right MCA stroke  Patient was last seen by Janett Billow May 01, 2022, high risk for partial seizure, please reach out to patient's again, may need EEG or follow-up visit

## 2022-05-22 NOTE — Progress Notes (Signed)
Subjective:    Patient ID: YOUSUF Black, male    DOB: [birth year], 46 y.o.   MRN: [id number]  HPI  An audio/video tele-health visit is felt to be the most appropriate encounter for this patient at this time. This is a follow up tele-visit via phone. The patient is at home. MD is at office. Prior to scheduling this appointment, our staff discussed the limitations of evaluation and management by telemedicine and the availability of in-person appointments. The patient expressed understanding and agreed to proceed.   Matthew Black is a 46 year old Black who presents for f/u of spasticity, shoulder pain, and aggression.  1) Spasticity: -Botox to pec was very helpul -he asks if he can get his Botox soon as he is not able to do PT until he gets this -has been taking Baclofen three times per day, it does make him sleepy -he is willing to try additional antispasticity medicine at night to help with his spasticity until he can get in for Botox -Botox is effective for pain relief and he needs it every 19month -His PT also noticed pain has been much better controlled -He keeps stretching out left arm with his right and this has helped to keep the arm loose.   2) Aggression: -Discussed that he has follow-up scheduled with Dr. RSima Matas   3) Shoulder pain -has developed shoulder pain this week that brings him to tears -he says it is hurting all over his shoulder joint -does not feel like the isolated pec pain he has had in the past  4) HTN -he needs refill of his metoprolol -his SBP has been 140s  Prior history:  Washing dishes and drying them.  Kidney stones resolved following procedure.  IVC filter to be removed soon. He enjoys cooking, working on stairs. He likes to cook spaghetti and eggs. He is able to walk around the the home. He is able to walk outside as well and wife says that he often walks in front of her.  He plays Scrabble with his kids and enjoys spending time with them.  Has  started drinking 1 coffee every morning.   He asks about alcohol use. Has been drinking wine, rum.  Wife states that tightness in elbow and knee flexors has been better and improves the more he walks.  Has sense of humor back.  Headaches have resolved.  He has been driving with his parents next to him and has been doing a great job. No issues except when he tries to get out of the car as he needs he AD at this time. His wife asks about a platform walker for him.   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 His wife accompanies him and provides most of the history.   No longer has pain at surgical site. Wife asks if he should have repeat CT scan. No longer has headache.   Has been having some right sided abdominal pain. Has kidney stone. Has been having daily BM but they are hard. Also has gas.    He has completed his PT and is able to ambulate around his home.   His wife says that prior to his stroke, he liked to play basketball with their kids (ages 138and 164, play with their dog, travel, and relax at home. He has told her that he is happy to be at home.  He has had tightness  in his right elbow and knee flexors and this has been painful to him. He has had two falls and his leg stiffness may have contributed to this.   Pain Inventory Average Pain 0 Pain Right Now 0 My pain is  no pain , left sided weakness. Here for Stroke follow up.  In the last 24 hours, has pain interfered with the following? General activity 0 Relation with others 0 Enjoyment of life 0 What TIME of day is your pain at its worst?  no pain Sleep (in general) Good  Pain is worse with:  no pain Pain improves with:  na Relief from Meds:  no pain  7 Bowel movements per week No problem with urination.      Family History  Problem Relation Age of Onset   Healthy Mother    Healthy Father    Clotting disorder Maternal  Grandmother    Diabetes Paternal Grandfather    Clotting disorder Maternal Aunt    Clotting disorder Maternal Uncle    Colon cancer Neg Hx    Social History   Socioeconomic History   Marital status: Married    Spouse name: Melene Muller   Number of children: 2   Years of education: Not on file   Highest education level: Not on file  Occupational History   Occupation: unemployed 01/30/21  Tobacco Use   Smoking status: Former    Packs/day: 0.00    Types: Cigarettes    Quit date: 11/24/2015    Years since quitting: 6.4   Smokeless tobacco: Never   Tobacco comments:       Vaping Use   Vaping Use: Never used  Substance and Sexual Activity   Alcohol use: Yes    Comment: socially   Drug use: Never   Sexual activity: Not Currently  Other Topics Concern   Not on file  Social History Narrative   Lives with wife and kids   Right Handed   Drinks >10 cups caffeine daily   Social Determinants of Health   Financial Resource Strain: Not on file  Food Insecurity: Not on file  Transportation Needs: Not on file  Physical Activity: Not on file  Stress: Not on file  Social Connections: Not on file   Past Surgical History:  Procedure Laterality Date   CRANIOPLASTY Right 06/05/2020   Procedure: CRANIOPLASTY;  Surgeon: Consuella Lose, MD;  Location: [facility];  Service: Neurosurgery;  Laterality: Right;  right   CRANIOTOMY Right 07/19/2019   Procedure: RIGHT HEMI-CRANIECTOMY With implantation of skull flap to abdominal wall;  Surgeon: Consuella Lose, MD;  Location: [facility];  Service: Neurosurgery;  Laterality: Right;   CYST EXCISION N/A 10/08/2016   Procedure: EXCISION OF POSTERIOR NECK CYST;  Surgeon: Clovis Riley, MD;  [facility];  Service: General;  Laterality: N/A;   CYSTOSCOPY/URETEROSCOPY/HOLMIUM LASER/STENT PLACEMENT Right 03/16/2020   Procedure: CYSTOSCOPY RIGHT RETROGRADE PYELOGRAM URETEROSCOPY/HOLMIUM LASER/STENT PLACEMENT;  Surgeon: Lucas Mallow, MD;  [facility];  Service: Urology;  Laterality: Right;   INCISION AND DRAINAGE ABSCESS N/A 09/22/2014   Procedure: INCISION AND DRAINAGE ABSCESS POSTERIOR NECK;  Surgeon: Pedro Earls, MD;  [facility];  Service: General;  Laterality: N/A;   INCISION AND DRAINAGE ABSCESS N/A 12/20/2015   Procedure: INCISION AND DRAINAGE POSTERIOR NECK MASS;  Surgeon: Armandina Gemma, MD;  [facility];  Service: General;  Laterality: N/A;   INCISION AND DRAINAGE ABSCESS Left 07/10/2004   middle finger   IR IVC FILTER PLMT /  S&I /IMG GUID/MOD SED  07/21/2019   IR RADIOLOGIST EVAL & MGMT  12/14/2019   IR RADIOLOGIST EVAL & MGMT  12/21/2020   IR VENOGRAM RENAL UNI RIGHT  07/21/2019   MASS EXCISION N/A 07/21/2017   Procedure: EXCISION OF BENIGN NECK LESION WITH LAYERED CLOSURE;  Surgeon: Irene Limbo, MD;  Location: [facility];  Service: Plastics;  Laterality: N/A;   MASS EXCISION N/A 11/10/2017   Procedure: EXCISION BENIGN LESION OF THE NECK WITH LAYERED CLOSURE;  Surgeon: Irene Limbo, MD;  Location: [facility];  Service: Plastics;  Laterality: N/A;   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  Opioid Risk Score:   Fall Risk Score:  `1  Depression screen [facility] 2/9     01/21/2022   11:24 AM 09/24/2021   11:15 AM 07/19/2021    1:58 PM 06/18/2021   11:36 AM 03/21/2021   12:11 PM 01/16/2021    3:19 PM 09/26/2020    1:21 PM  Depression screen [facility] 2/9  Decreased Interest 0 0 0 0 0 0 0  Down, Depressed, Hopeless 0 0 0 0 0 0  0  PHQ - 2 Score 0 0 0 0 0 0 0  Altered sleeping   0      Tired, decreased energy   0      Change in appetite   0      Feeling bad or failure about yourself    0      Trouble concentrating   0      Moving slowly or fidgety/restless   0      Suicidal thoughts   0      PHQ-9 Score   0      Difficult doing work/chores   Not difficult at all         Review of Systems     Objective:   Physical Exam Not performed as patient was seen via phone     Assessment & Plan:  Matthew Black is a a 46 year old Black who presents for f/u following his right middle cerebral artery stroke.   1) Headache -Continues to be resolved. Advised that he does not required repeat CT Head unless he develops symptoms and signs of infection or bleed--discussed which to look out for.   2) Abdominal pain: -Resolved following surgical removal of kidney stones  3) Weakness/spasticity: -Left sided tone much improved after Botox. Will target left pec major again. Will repeat every 3 months. Arm is currently without signficant tone -Asked staff to prioritize him as #1 on waitlist for Botox -prescribed tizanidine to help his spasticity until he is able to get in -Encouraged him to keep stretching left arm.  -Discussed transcranial magnetic stimulation as a an option to improve muscle strength. Also discussed [employer] PNS as an option. Provided links and phones numbers for both options for wife to investigate further.  -Made goal to walk outside  15 minutes per day, start resistance training with low weights.  -Provided script for platform walker -Provided script for occupational therapy.   4) Aggression: -provided referral to see Dr. Sima Matas  5) shoulder pain -prescribed steroid dose pack -if does not help, can consider steroid injection into the joint.   6) MCA stroke: Return to driving: -Once parents clear him after driving next to him > times he may drive on his own.   7) HTN -refilled metoprolol  Cranial  incision healing well.   Quality of life: Discussed spending time with his kids, playing games to keep his mind sharp.    All questions were encouraged and answered. Follow up with me in 6 weeks.   5 minutes spent in discussion of his spasticity and HTN, sending tizanidine to help with spasticity until we can get him in for Botox

## 2022-05-22 NOTE — Telephone Encounter (Signed)
Scripts differ:   Pharmacy sent over a request for Metoprolol 50 MG, Take 1 Tablet by mouth every day, #180 with one refill.  Please send if needed and d/c the old. Or please correct the old Rx in Epic.  Thank you.

## 2022-05-23 ENCOUNTER — Ambulatory Visit: Payer: Medicaid Other | Admitting: Medical

## 2022-06-08 ENCOUNTER — Other Ambulatory Visit: Payer: Self-pay | Admitting: Neurology

## 2022-06-08 ENCOUNTER — Other Ambulatory Visit: Payer: Self-pay | Admitting: Medical

## 2022-06-10 ENCOUNTER — Telehealth: Payer: Self-pay | Admitting: Adult Health

## 2022-06-10 ENCOUNTER — Encounter
Payer: Medicaid Other | Attending: Physical Medicine and Rehabilitation | Admitting: Physical Medicine and Rehabilitation

## 2022-06-10 ENCOUNTER — Encounter: Payer: Self-pay | Admitting: Physical Medicine and Rehabilitation

## 2022-06-10 VITALS — BP 114/78 | HR 69 | Ht 72.0 in | Wt 238.0 lb

## 2022-06-10 DIAGNOSIS — I69398 Other sequelae of cerebral infarction: Secondary | ICD-10-CM | POA: Diagnosis not present

## 2022-06-10 DIAGNOSIS — R252 Cramp and spasm: Secondary | ICD-10-CM | POA: Diagnosis not present

## 2022-06-10 MED ORDER — ONABOTULINUMTOXINA 100 UNITS IJ SOLR: 100.0000 [IU] | Freq: Once | INTRAMUSCULAR | Status: DC

## 2022-06-10 NOTE — Progress Notes (Signed)
Botox Injection for spasticity and left sided UE internal rotation using ultrasound guidance  Indication: Severe spasticity which interferes with ADL,mobility and/or  hygiene and is unresponsive to medication management and other conservative care Informed consent was obtained after describing risks and benefits of the procedure with the patient. This includes bleeding, bruising, infection, excessive weakness, or medication side effects. A REMS form is on file and signed. Needle: 25cc needle Number of units per muscle Left pectoralis major: 100U in 3 sites  100 U injected into 3 sites All injections were done after obtaining appropriate ultrasound visualization (pictures taken) and after negative drawback for blood. The patient tolerated the procedure well. Post procedure instructions were given. A followup appointment was made.

## 2022-06-10 NOTE — Telephone Encounter (Signed)
Pt called needing a refill on his aspirin (ASPIRIN LOW DOSE) 81 MG EC tablet sent [facility]

## 2022-06-10 NOTE — Telephone Encounter (Signed)
Called patient and informed him that when he last saw NP she released him to his PCP because he is doing well. We will refill one month but future refills come from his PCP. Patient verbalized understanding, appreciation.

## 2022-06-13 ENCOUNTER — Other Ambulatory Visit: Payer: Self-pay | Admitting: Adult Health

## 2022-06-26 ENCOUNTER — Other Ambulatory Visit: Payer: Self-pay | Admitting: Physical Medicine and Rehabilitation

## 2022-06-26 DIAGNOSIS — Z789 Other specified health status: Secondary | ICD-10-CM

## 2022-06-26 DIAGNOSIS — I639 Cerebral infarction, unspecified: Secondary | ICD-10-CM

## 2022-07-01 ENCOUNTER — Ambulatory Visit: Payer: Medicaid Other | Attending: Medical | Admitting: Occupational Therapy

## 2022-07-01 DIAGNOSIS — M25512 Pain in left shoulder: Secondary | ICD-10-CM | POA: Insufficient documentation

## 2022-07-01 DIAGNOSIS — I69354 Hemiplegia and hemiparesis following cerebral infarction affecting left non-dominant side: Secondary | ICD-10-CM | POA: Insufficient documentation

## 2022-07-01 DIAGNOSIS — R41840 Attention and concentration deficit: Secondary | ICD-10-CM | POA: Insufficient documentation

## 2022-07-01 DIAGNOSIS — M6281 Muscle weakness (generalized): Secondary | ICD-10-CM | POA: Insufficient documentation

## 2022-07-01 DIAGNOSIS — R2689 Other abnormalities of gait and mobility: Secondary | ICD-10-CM | POA: Insufficient documentation

## 2022-07-01 DIAGNOSIS — R2681 Unsteadiness on feet: Secondary | ICD-10-CM | POA: Insufficient documentation

## 2022-07-01 DIAGNOSIS — R29898 Other symptoms and signs involving the musculoskeletal system: Secondary | ICD-10-CM | POA: Insufficient documentation

## 2022-07-01 DIAGNOSIS — R208 Other disturbances of skin sensation: Secondary | ICD-10-CM | POA: Insufficient documentation

## 2022-07-01 DIAGNOSIS — M25612 Stiffness of left shoulder, not elsewhere classified: Secondary | ICD-10-CM | POA: Insufficient documentation

## 2022-07-01 DIAGNOSIS — R293 Abnormal posture: Secondary | ICD-10-CM | POA: Insufficient documentation

## 2022-07-01 DIAGNOSIS — G8929 Other chronic pain: Secondary | ICD-10-CM | POA: Insufficient documentation

## 2022-07-17 ENCOUNTER — Ambulatory Visit: Payer: Medicaid Other | Admitting: Physical Medicine and Rehabilitation

## 2022-07-18 ENCOUNTER — Ambulatory Visit: Payer: Medicaid Other | Admitting: Occupational Therapy

## 2022-07-18 DIAGNOSIS — R2689 Other abnormalities of gait and mobility: Secondary | ICD-10-CM | POA: Diagnosis not present

## 2022-07-18 DIAGNOSIS — R29898 Other symptoms and signs involving the musculoskeletal system: Secondary | ICD-10-CM

## 2022-07-18 DIAGNOSIS — G8929 Other chronic pain: Secondary | ICD-10-CM | POA: Diagnosis not present

## 2022-07-18 DIAGNOSIS — R293 Abnormal posture: Secondary | ICD-10-CM | POA: Diagnosis not present

## 2022-07-18 DIAGNOSIS — R41840 Attention and concentration deficit: Secondary | ICD-10-CM | POA: Diagnosis not present

## 2022-07-18 DIAGNOSIS — I69354 Hemiplegia and hemiparesis following cerebral infarction affecting left non-dominant side: Secondary | ICD-10-CM | POA: Diagnosis not present

## 2022-07-18 DIAGNOSIS — M6281 Muscle weakness (generalized): Secondary | ICD-10-CM

## 2022-07-18 DIAGNOSIS — R2681 Unsteadiness on feet: Secondary | ICD-10-CM | POA: Diagnosis not present

## 2022-07-18 DIAGNOSIS — M25612 Stiffness of left shoulder, not elsewhere classified: Secondary | ICD-10-CM

## 2022-07-18 DIAGNOSIS — R208 Other disturbances of skin sensation: Secondary | ICD-10-CM | POA: Diagnosis not present

## 2022-07-18 DIAGNOSIS — M25512 Pain in left shoulder: Secondary | ICD-10-CM | POA: Diagnosis not present

## 2022-07-18 NOTE — Therapy (Signed)
OUTPATIENT OCCUPATIONAL THERAPY NEURO EVALUATION  Patient Name: Matthew Black MRN: [id number] DOB:May 12, 1976, 46 y.o., male Today's Date: 07/18/2022  PCP: Dr. [name] REFERRING PROVIDER: Dr. 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   Depression    DVT (deep venous thrombosis) ([place])    BLE DVT 07/21/19, 08/02/19; s/p retrievable IVC filter 07/21/19   Hemorrhagic stroke ([place])    History of kidney stones    Hypertension    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   PE (pulmonary thromboembolism) ([place])    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   Stroke ([place])    RICA, R A1, R MCA occlusion 07/19/19   Past Surgical History:  Procedure Laterality Date   CRANIOPLASTY Right 06/05/2020   Procedure: CRANIOPLASTY;  Surgeon: Consuella Lose, MD;  Location: [facility];  Service: Neurosurgery;  Laterality: Right;  right   CRANIOTOMY Right 07/19/2019   Procedure: RIGHT HEMI-CRANIECTOMY With implantation of skull flap to abdominal wall;  Surgeon: Consuella Lose, MD;  Location: [facility];  Service: Neurosurgery;  Laterality: Right;   CYST EXCISION N/A 10/08/2016   Procedure: EXCISION OF POSTERIOR NECK CYST;  Surgeon: Clovis Riley, MD;  [facility];  Service: General;  Laterality: N/A;   CYSTOSCOPY/URETEROSCOPY/HOLMIUM LASER/STENT PLACEMENT Right 03/16/2020   Procedure: CYSTOSCOPY RIGHT RETROGRADE PYELOGRAM URETEROSCOPY/HOLMIUM LASER/STENT PLACEMENT;   Surgeon: Lucas Mallow, MD;  [facility];  Service: Urology;  Laterality: Right;   INCISION AND DRAINAGE ABSCESS N/A 09/22/2014   Procedure: INCISION AND DRAINAGE ABSCESS POSTERIOR NECK;  Surgeon: Pedro Earls, MD;  [facility];  Service: General;  Laterality: N/A;   INCISION AND DRAINAGE ABSCESS N/A 12/20/2015   Procedure: INCISION AND DRAINAGE POSTERIOR NECK MASS;  Surgeon: Armandina Gemma, MD;  [facility];  Service: General;  Laterality: N/A;   INCISION AND DRAINAGE ABSCESS Left 07/10/2004   middle finger   IR IVC FILTER PLMT / S&I /IMG GUID/MOD SED  07/21/2019   IR RADIOLOGIST EVAL & MGMT  12/14/2019   IR RADIOLOGIST EVAL & MGMT  12/21/2020   IR VENOGRAM RENAL UNI RIGHT  07/21/2019   MASS EXCISION N/A 07/21/2017   Procedure: EXCISION OF BENIGN NECK LESION WITH LAYERED CLOSURE;  Surgeon: Irene Limbo, MD;  Location: [facility];  Service: Plastics;  Laterality: N/A;   MASS EXCISION N/A 11/10/2017   Procedure: EXCISION BENIGN LESION OF THE NECK WITH LAYERED CLOSURE;  Surgeon: Irene Limbo, MD;  Location: [facility];  Service: Plastics;  Laterality: N/A;   Patient Active Problem List   Diagnosis Date Noted   COVID-19 virus infection 04/21/2021   Left-sided weakness 04/21/2021   S/P craniotomy 06/05/2020   History of cranioplasty 06/05/2020   Peri-rectal abscess 02/14/2020   Abnormal CT scan, pelvis  02/14/2020   Pancytopenia ([place]) 12/02/2019   Nephrolithiasis 12/02/2019   Hydronephrosis with renal and ureteral calculus obstruction 12/02/2019   Rectal pain 12/02/2019   Hyperkalemia 12/02/2019   Reactive depression    Wound infection after surgery    Sleep disturbance    Dysphagia, post-stroke    Transaminitis    Right middle cerebral artery stroke ([place]) 09/06/2019   Cerebral abscess    Urinary tract infection without hematuria    Altered mental status    Primary hypercoagulable state ([place])    Acute pulmonary embolism without  acute cor pulmonale (HCC)    Deep vein thrombosis (DVT) of non-extremity vein    Hypokalemia    Acute blood loss anemia    Leukocytosis    Endotracheal tube present    Acute respiratory failure with hypoxemia (HCC)    Stroke (cerebrum) ([place]) 07/19/2019   Pressure injury of skin 07/19/2019   Acute CVA (cerebrovascular accident) ([place])    Encephalopathy    Dysphagia    Acute encephalopathy    Essential hypertension    Obesity 03/12/2016   Scalp abscess 12/20/2015   Neck abscess 12/20/2015   Pilonidal cyst 02/08/2013    ONSET DATE: 06/10/22  REFERRING DIAG: N56.213,Y86.5 (ICD-10-CM) - 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 (CVA)  THERAPY DIAG:  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  PMH: hx of seizures and headaches s/p CVA, hx of DVT with IVC filter placement . Pt s/p botox on 06/10/22, possible seizure in June 2023    Limitations fall risk, hx of seizures   PRECAUTIONS: Fall and Other: seizures - possible seizure in June?  WEIGHT BEARING RESTRICTIONS No  PAIN:  Are you having pain? Yes: NPRS scale: 6/10 Pain location: LUE Pain description: aching Aggravating factors: movement Relieving factors: rest    LIVING ENVIRONMENT: Lives with: lives with their spouse Lives in: House/apartment PLOF: Needs assistance with  ADLs and Needs assistance with homemaking  PATIENT GOALS aquatic therapy  OBJECTIVE:   HAND DOMINANCE: Right  ADLs: Overall ADLs: pt requires assistance at times Transfers/ambulation related to ADLs: Eating: mod I Grooming: mod I UB Dressing: mod I LB Dressing: mod A with shoes and socks Toileting: mod I with cane per pt report Bathing: min A sponge bathing for RUE    IADLs: Shopping: dependent Light housekeeping: dependent Meal Prep: needs assist  Medication management: pt's  Financial management: dependent   MOBILITY STATUS:  amb short distances with a cane, uses w/c  POSTURE COMMENTS:    ACTIVITY TOLERANCE: Activity tolerance: fatiques quickly  FUNCTIONAL OUTCOME MEASURES: Functional assessment questionnaire: 23/64 (64 indicates normal)  UPPER EXTREMITY ROM     Passive ROM Right eval Left eval  Shoulder flexion  80  Shoulder abduction  80  Shoulder adduction    Shoulder extension    Shoulder internal rotation    Shoulder external rotation    Elbow flexion  120  Elbow extension  -20  Wrist flexion    Wrist extension    Wrist ulnar deviation    Wrist radial deviation    Wrist pronation    Wrist supination    No active movement in LUE   HAND FUNCTION: no active movment in LUE     COGNITION: Overall cognitive status: Impaired, delayed processing, short term memory deficits   OBSERVATIONS: Pt demonstrates delayed response time to questions   TODAY'S TREATMENT:  Eval only   PATIENT EDUCATION: Education details: role of OT, and that pt will need someone to accompany him if he goes to the pool for aquatic therapy Person educated: Patient Education method: Explanation Education comprehension: verbalized understanding   HOME EXERCISE PROGRAM: N/A    GOALS:  LONG TERM GOALS: Target date: 09/25/22 Pt will verbalize understanding of adapted strategies to maximize pt I with ADLS/IADLs(opening sandwich containers, open loaf of bread,  plastic bags) Baseline: dependent Goal status: INITIAL  2.  I with updated HEP  Baseline: dependent Goal status: INITIAL  3.  Pt will complete an aquatic exercise program designed to increase LUE ROM and minimize pain Baseline: dependent Goal status: INITIAL  ASSESSMENT:  CLINICAL IMPRESSION: Patient is a 46 y.o. 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 Pt has episode of left sided tremor in June, (05/22/22)possible seizure.  PERFORMANCE DEFICITS in functional skills including ADLs, IADLs, coordination, dexterity, proprioception, sensation, edema, tone, ROM, strength, pain, flexibility, FMC, GMC, mobility, balance, endurance, decreased knowledge of precautions, decreased knowledge of use of DME, and UE functional use, cognitive skills including energy/drive, memory, thought, and understand, and psychosocial skills including coping strategies, environmental adaptation, habits, interpersonal interactions, and routines and behaviors.   IMPAIRMENTS are limiting patient from ADLs, IADLs, work, play, leisure, and social participation.   COMORBIDITIES may have co-morbidities  that affects occupational performance. Patient will benefit from skilled OT to address above impairments and improve overall function.  MODIFICATION OR ASSISTANCE TO COMPLETE EVALUATION: No modification of tasks or assist necessary to complete an evaluation.  OT OCCUPATIONAL PROFILE AND HISTORY: Problem focused assessment: Including review of records relating to presenting problem.  CLINICAL DECISION MAKING: LOW - limited treatment options, no task modification necessary  REHAB POTENTIAL: Good  EVALUATION COMPLEXITY: Low    PLAN: OT FREQUENCY: 1x/week start 08/07/22  OT DURATION: 8 weeks  PLANNED  INTERVENTIONS: self care/ADL training, therapeutic exercise, therapeutic activity, neuromuscular re-education, manual therapy, passive range of motion, balance training, aquatic therapy, splinting, electrical stimulation, ultrasound, paraffin, fluidotherapy, moist heat, cryotherapy, contrast bath, patient/family education, cognitive remediation/compensation, energy conservation, coping strategies training, and DME and/or AE instructions  RECOMMENDED OTHER SERVICES: n/a  CONSULTED AND AGREED WITH PLAN OF CARE: Patient  PLAN FOR NEXT SESSION: HEP, ADL strategies, determine appropriateness of aquatic therapy/ availability  Managed medicaid CPT codes: [id number]- Therapeutic Exercise, [phone]- Neuro Re-education, [phone] - Gait Training, [id number] - Manual Therapy, [id number] - Therapeutic Activities, 97535 - Self Care, 97014 - Electrical stimulation (unattended), [id number] - Ultrasound, [id number] - Orthotic Fit, [id number] - Aquatic therapy, [id number] - Fluidotherapy, [id number] -  Paraffin, [id number] - Cognitive training (First 15 min), and 97130 - Cognitive training (each additional 15 min)  Rie Mcneil, OT 07/18/2022, 1:28 PM

## 2022-08-04 ENCOUNTER — Telehealth: Payer: Self-pay

## 2022-08-04 DIAGNOSIS — Z789 Other specified health status: Secondary | ICD-10-CM

## 2022-08-04 NOTE — Telephone Encounter (Signed)
Please call Mr. Musich back about the chair lift. [facility] nor [employer] has contacted Mr. Veronica for an update.

## 2022-08-05 ENCOUNTER — Other Ambulatory Visit: Payer: Self-pay | Admitting: Adult Health

## 2022-08-05 ENCOUNTER — Other Ambulatory Visit: Payer: Self-pay | Admitting: Physical Medicine and Rehabilitation

## 2022-08-05 ENCOUNTER — Telehealth: Payer: Self-pay

## 2022-08-05 DIAGNOSIS — I639 Cerebral infarction, unspecified: Secondary | ICD-10-CM

## 2022-08-05 NOTE — Telephone Encounter (Addendum)
Please place a new order of Matthew Black. Send it to [facility], fax [phone]. Matthew Black need a wall chair lift for home use to help with ADL's. Attention Adam.   Along with a letter of medical necessity.   Thank you

## 2022-08-07 ENCOUNTER — Ambulatory Visit: Payer: Medicaid Other | Attending: Medical | Admitting: Occupational Therapy

## 2022-08-07 ENCOUNTER — Encounter: Payer: Self-pay | Admitting: Occupational Therapy

## 2022-08-07 DIAGNOSIS — M25512 Pain in left shoulder: Secondary | ICD-10-CM | POA: Insufficient documentation

## 2022-08-07 DIAGNOSIS — I69354 Hemiplegia and hemiparesis following cerebral infarction affecting left non-dominant side: Secondary | ICD-10-CM | POA: Insufficient documentation

## 2022-08-07 DIAGNOSIS — R293 Abnormal posture: Secondary | ICD-10-CM | POA: Insufficient documentation

## 2022-08-07 DIAGNOSIS — M6281 Muscle weakness (generalized): Secondary | ICD-10-CM | POA: Insufficient documentation

## 2022-08-07 DIAGNOSIS — R208 Other disturbances of skin sensation: Secondary | ICD-10-CM | POA: Insufficient documentation

## 2022-08-07 DIAGNOSIS — M25612 Stiffness of left shoulder, not elsewhere classified: Secondary | ICD-10-CM | POA: Insufficient documentation

## 2022-08-07 DIAGNOSIS — R2681 Unsteadiness on feet: Secondary | ICD-10-CM | POA: Insufficient documentation

## 2022-08-07 DIAGNOSIS — G8929 Other chronic pain: Secondary | ICD-10-CM | POA: Diagnosis not present

## 2022-08-07 DIAGNOSIS — R482 Apraxia: Secondary | ICD-10-CM | POA: Insufficient documentation

## 2022-08-07 DIAGNOSIS — R29898 Other symptoms and signs involving the musculoskeletal system: Secondary | ICD-10-CM | POA: Diagnosis not present

## 2022-08-07 NOTE — Therapy (Signed)
OUTPATIENT OCCUPATIONAL THERAPY NEURO EVALUATION  Patient Name: Matthew Black MRN: [id number] DOB:08-13-1976, 46 y.o., male Today's Date: 08/07/2022  PCP: Dr. [name] REFERRING PROVIDER: Dr. 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   Depression    DVT (deep venous thrombosis) ([place])    BLE DVT 07/21/19, 08/02/19; s/p retrievable IVC filter 07/21/19   Hemorrhagic stroke ([place])    History of kidney stones    Hypertension    Paralysis ([place])    LEFT SIDE   PE (pulmonary thromboembolism) ([place])    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   Stroke ([place])    RICA, R A1, R MCA occlusion 07/19/19   Past Surgical History:  Procedure Laterality Date   CRANIOPLASTY Right 06/05/2020   Procedure: CRANIOPLASTY;  Surgeon: Consuella Lose, MD;  Location: [place];  Service: Neurosurgery;  Laterality: Right;  right   CRANIOTOMY Right 07/19/2019   Procedure: RIGHT HEMI-CRANIECTOMY With implantation of skull flap to abdominal wall;  Surgeon: Consuella Lose, MD;  Location: [place];  Service: Neurosurgery;  Laterality: Right;   CYST EXCISION N/A 10/08/2016   Procedure: EXCISION OF POSTERIOR NECK CYST;  Surgeon: Clovis Riley, MD;  [facility];  Service: General;  Laterality: N/A;   CYSTOSCOPY/URETEROSCOPY/HOLMIUM LASER/STENT PLACEMENT Right 03/16/2020   Procedure: CYSTOSCOPY RIGHT RETROGRADE PYELOGRAM URETEROSCOPY/HOLMIUM LASER/STENT PLACEMENT;   Surgeon: Lucas Mallow, MD;  [facility];  Service: Urology;  Laterality: Right;   INCISION AND DRAINAGE ABSCESS N/A 09/22/2014   Procedure: INCISION AND DRAINAGE ABSCESS POSTERIOR NECK;  Surgeon: Pedro Earls, MD;  [facility];  Service: General;  Laterality: N/A;   INCISION AND DRAINAGE ABSCESS N/A 12/20/2015   Procedure: INCISION AND DRAINAGE POSTERIOR NECK MASS;  Surgeon: Armandina Gemma, MD;  [facility];  Service: General;  Laterality: N/A;   INCISION AND DRAINAGE ABSCESS Left 07/10/2004   middle finger   IR IVC FILTER PLMT / S&I /IMG GUID/MOD SED  07/21/2019   IR RADIOLOGIST EVAL & MGMT  12/14/2019   IR RADIOLOGIST EVAL & MGMT  12/21/2020   IR VENOGRAM RENAL UNI RIGHT  07/21/2019   MASS EXCISION N/A 07/21/2017   Procedure: EXCISION OF BENIGN NECK LESION WITH LAYERED CLOSURE;  Surgeon: Irene Limbo, MD;  Location: [place];  Service: Plastics;  Laterality: N/A;   MASS EXCISION N/A 11/10/2017   Procedure: EXCISION BENIGN LESION OF THE NECK WITH LAYERED CLOSURE;  Surgeon: Irene Limbo, MD;  Location: [place];  Service: Plastics;  Laterality: N/A;   Patient Active Problem List   Diagnosis Date Noted   COVID-19 virus infection 04/21/2021   Left-sided weakness 04/21/2021   S/P craniotomy 06/05/2020   History of cranioplasty 06/05/2020   Peri-rectal abscess 02/14/2020   Abnormal CT scan, pelvis  02/14/2020   Pancytopenia (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   Cerebral abscess    Urinary tract infection without hematuria    Altered mental status    Primary hypercoagulable state ([place])    Acute pulmonary embolism without  acute cor pulmonale (HCC)    Deep vein thrombosis (DVT) of non-extremity vein    Hypokalemia    Acute blood loss anemia    Leukocytosis    Endotracheal tube present    Acute respiratory failure with hypoxemia (HCC)    Stroke (cerebrum) ([place]) 07/19/2019   Pressure injury of skin 07/19/2019   Acute CVA (cerebrovascular accident) ([place])    Encephalopathy    Dysphagia    Acute encephalopathy    Essential hypertension    Obesity 03/12/2016   Scalp abscess 12/20/2015   Neck abscess 12/20/2015   Pilonidal cyst 02/08/2013    ONSET DATE: 06/10/22  REFERRING DIAG: Q94.765,Y65.0 (ICD-10-CM) - 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 (CVA)  THERAPY DIAG:  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  PMH: hx of seizures and headaches s/p CVA, hx of DVT with IVC filter placement . Pt s/p botox on 06/10/22, possible seizure in June 2023    Limitations fall risk, hx of seizures   PRECAUTIONS: Fall and Other: seizures - possible seizure in June?  WEIGHT BEARING RESTRICTIONS No  PAIN:  Are you having pain? Yes: NPRS scale: 6/10 Pain location: LUE Pain description: aching Aggravating factors: movement Relieving factors: rest    LIVING ENVIRONMENT: Lives with: lives with their spouse Lives in: House/apartment PLOF: Needs assistance with ADLs and Needs assistance with homemaking  PATIENT GOALS aquatic therapy  OBJECTIVE:   HAND DOMINANCE:  Right  ADLs: Overall ADLs: pt requires assistance at times Transfers/ambulation related to ADLs: Eating: mod I Grooming: mod I UB Dressing: mod I LB Dressing: mod A with shoes and socks Toileting: mod I with cane per pt report Bathing: min A sponge bathing for RUE    IADLs: Shopping: dependent Light housekeeping: dependent Meal Prep: needs assist  Medication management: pt's  Financial management: dependent   MOBILITY STATUS:  amb short distances with a cane, uses w/c  POSTURE COMMENTS:    ACTIVITY TOLERANCE: Activity tolerance: fatiques quickly  FUNCTIONAL OUTCOME MEASURES: Functional assessment questionnaire: 23/64 (64 indicates normal)  UPPER EXTREMITY ROM     Passive ROM Right eval Left eval  Shoulder flexion  80  Shoulder abduction  80  Shoulder adduction    Shoulder extension    Shoulder internal rotation    Shoulder external rotation    Elbow flexion  120  Elbow extension  -20  Wrist flexion    Wrist extension    Wrist ulnar deviation  Wrist radial deviation    Wrist pronation    Wrist supination    No active movement in LUE   HAND FUNCTION: no active movment in LUE     COGNITION: Overall cognitive status: Impaired, delayed processing, short term memory deficits   OBSERVATIONS: Pt demonstrates delayed response time to questions   TODAY'S TREATMENT:  08/07/22: Reviewed plan of care with patient.  Since patient with visit limitation - opts for aquatic therapy visits this episode of care.   Worked on new stretch to left shoulder.  Patient continuing with table slides to maintain range of motion and reduce pain in left shoulder.  Built up height of table, worked on active trunk extension with weight in midline.  Then weight shifting toward left over stable arms on tabletop to stretch horizontal adduction.   Also worked on activating left hip in seated position.  Patient felt cramp in left hip with activation.   Standing with weight in both  feet.  [place]keing to flex forward at hips to bring hands to high mat table.  Patient with report of pain in left hamstring.  Modified activity to have decreased hip flexion, and slower transition and patient tolerated better.  Able to accept weight through left fist - extended elbow.     PATIENT EDUCATION: Education details: role of OT, and that pt will need someone to accompany him if he goes to the pool for aquatic therapy Person educated: Patient Education method: Explanation Education comprehension: verbalized understanding   HOME EXERCISE PROGRAM: N/A    GOALS:  LONG TERM GOALS: Target date: 09/25/22 Pt will verbalize understanding of adapted strategies to maximize pt I with ADLS/IADLs(opening sandwich containers, open loaf of bread, plastic bags) Baseline: dependent Goal status: INITIAL  2.  I with updated HEP  Baseline: dependent Goal status: INITIAL  3.  Pt will complete an aquatic exercise program designed to increase LUE ROM and minimize pain Baseline: dependent Goal status: INITIAL  ASSESSMENT:  CLINICAL IMPRESSION: Patient returns today for OT treatment and assessment for appropriateness for aquatic therapy.  Patient wishes to return to pool for further work to improve spasticity management , functional mobility and reduce shoulder pain / improve passive shoulder motion.    PERFORMANCE DEFICITS in functional skills including ADLs, IADLs, coordination, dexterity, proprioception, sensation, edema, tone, ROM, strength, pain, flexibility, FMC, GMC, mobility, balance, endurance, decreased knowledge of precautions, decreased knowledge of use of DME, and UE functional use, cognitive skills including energy/drive, memory, thought, and understand, and psychosocial skills including coping strategies, environmental adaptation, habits, interpersonal interactions, and routines and behaviors.   IMPAIRMENTS are limiting patient from ADLs, IADLs, work, play, leisure, and social  participation.   COMORBIDITIES may have co-morbidities  that affects occupational performance. Patient will benefit from skilled OT to address above impairments and improve overall function.  MODIFICATION OR ASSISTANCE TO COMPLETE EVALUATION: No modification of tasks or assist necessary to complete an evaluation.  OT OCCUPATIONAL PROFILE AND HISTORY: Problem focused assessment: Including review of records relating to presenting problem.  CLINICAL DECISION MAKING: LOW - limited treatment options, no task modification necessary  REHAB POTENTIAL: Good  EVALUATION COMPLEXITY: Low    PLAN: OT FREQUENCY: 1x/week start 08/07/22  OT DURATION: 8 weeks  PLANNED INTERVENTIONS: self care/ADL training, therapeutic exercise, therapeutic activity, neuromuscular re-education, manual therapy, passive range of motion, balance training, aquatic therapy, splinting, electrical stimulation, ultrasound, paraffin, fluidotherapy, moist heat, cryotherapy, contrast bath, patient/family education, cognitive remediation/compensation, energy conservation, coping strategies training, and DME and/or AE instructions  RECOMMENDED OTHER SERVICES: n/a  CONSULTED AND AGREED WITH PLAN OF CARE: Patient  PLAN FOR NEXT SESSION: aquatic therapy  Managed medicaid CPT codes: [phone]- Therapeutic Exercise, [phone]- Neuro Re-education, [phone] - Gait Training, [phone] - Manual Therapy, 82423 - Therapeutic Activities, [id number] - [place], [id number] - Electrical stimulation (unattended), [id number] - Ultrasound, [id number] - Orthotic Fit, [id number] - Aquatic therapy, [id number] - Fluidotherapy, [id number] -  Paraffin, [id number] - Cognitive training (First 15 min), and 43154 - Cognitive training (each additional 15 min)    Mariah Milling, OT 08/07/2022, 2:13 PM

## 2022-08-08 NOTE — Telephone Encounter (Signed)
[facility] office faxing system is currently down.

## 2022-08-08 NOTE — Telephone Encounter (Signed)
All information ready to be sent.

## 2022-08-11 ENCOUNTER — Other Ambulatory Visit: Payer: Self-pay

## 2022-08-11 DIAGNOSIS — R252 Cramp and spasm: Secondary | ICD-10-CM

## 2022-08-11 MED ORDER — BACLOFEN 10 MG PO TABS: 10.0000 mg | ORAL_TABLET | Freq: Three times a day (TID) | ORAL | 1 refills | Status: DC

## 2022-08-11 NOTE — Telephone Encounter (Signed)
Pharmacy is sending a refill request for Baclofen 10 mg. I do not see where the patient is still taking it and the pharmacy says the last fill was 05/16/22. Does it need to be refilled?

## 2022-08-12 ENCOUNTER — Telehealth: Payer: Self-pay

## 2022-08-12 NOTE — Telephone Encounter (Signed)
On 08/11/2022 referral information has been received by [facility]. Phone confirmation has been received also. Per [facility] [name] need be apart of  [employer] Program for his insurance to pay for the equipment. They have informed the patient.

## 2022-08-12 NOTE — Telephone Encounter (Signed)
Message left for Mr. Lineman to call back with a name, location, phone & fax number to send referral information.

## 2022-08-14 ENCOUNTER — Encounter: Payer: Medicaid Other | Admitting: Occupational Therapy

## 2022-08-15 ENCOUNTER — Ambulatory Visit (HOSPITAL_BASED_OUTPATIENT_CLINIC_OR_DEPARTMENT_OTHER)
Admission: RE | Admit: 2022-08-15 | Discharge: 2022-08-15 | Disposition: A | Discharge status: Home / Self Care | Payer: Medicaid Other | Source: Ambulatory Visit | Attending: Medical | Admitting: Medical

## 2022-08-15 ENCOUNTER — Ambulatory Visit: Payer: Medicaid Other | Admitting: Medical

## 2022-08-15 VITALS — BP 122/82 | HR 75 | Temp 98.6°F | Resp 18 | Ht 72.0 in

## 2022-08-15 DIAGNOSIS — M79609 Pain in unspecified limb: Secondary | ICD-10-CM | POA: Diagnosis not present

## 2022-08-15 DIAGNOSIS — I10 Essential (primary) hypertension: Secondary | ICD-10-CM

## 2022-08-15 DIAGNOSIS — M79662 Pain in left lower leg: Secondary | ICD-10-CM

## 2022-08-15 DIAGNOSIS — E7849 Other hyperlipidemia: Secondary | ICD-10-CM | POA: Diagnosis not present

## 2022-08-15 DIAGNOSIS — R739 Hyperglycemia, unspecified: Secondary | ICD-10-CM | POA: Diagnosis not present

## 2022-08-15 DIAGNOSIS — Z8673 Personal history of transient ischemic attack (TIA), and cerebral infarction without residual deficits: Secondary | ICD-10-CM

## 2022-08-15 DIAGNOSIS — Z23 Encounter for immunization: Secondary | ICD-10-CM | POA: Diagnosis not present

## 2022-08-15 DIAGNOSIS — Z9889 Other specified postprocedural states: Secondary | ICD-10-CM

## 2022-08-15 DIAGNOSIS — I63411 Cerebral infarction due to embolism of right middle cerebral artery: Secondary | ICD-10-CM

## 2022-08-15 NOTE — Patient Instructions (Addendum)
History of stroke/CVA.  Postcraniotomy.  Since then left upper extremity and lower extremity weakness.  Difficulty with ADLs.  On discussion particularly cooking and self-care/bathing himself.  Patient has requested assistance for personal care services.  I think that is reasonable and will fax paperwork over when.  Hypertension-blood pressure reasonably controlled today.  Continue metoprolol 50 mg twice daily.  Hyperlipidemia-on atorvastatin 10 mg daily.  Left shoulder and arm pain since stroke.  Patient describes getting Botox injections.  He describes 1 upcoming in the near future.  Left lower extremity spasms as well.  He is on baclofen.  Today he has new onset left calf pain.  Hurts for him to walk.  He has left lower extremity brace on and states that it hurts his calf.  We will get a left lower extremity ultrasound stat today.  We will get CMP, A1c and lipid panel today as well.  If ultrasound studies are negative then we will try to reach out to our physical medicine rehab doctors/send message to get input on pain management.  Follow-up date to be determined after lab review.

## 2022-08-15 NOTE — Progress Notes (Signed)
Subjective:    Patient ID: Matthew Black, male    DOB: [birth year], 46 y.o.   MRN: [id number]  HPI   Pt has hx of stroke. Pt states he needs he needs aid to help him bath and to prepare food. Pt has medicaid.    Post stroke pt has both left side upper and lower extremity deficits.   Pt aphasia has resolved significantly per his report. But on review today he admits has some difficulty expressing himself.    Pt feel like his mood. No longer on celexa.  Hx of htn. Pt is on lopressor 50 mg daily.   Hx of high cholesterol. Pt is on atorvastatin 10 mg daily.   He has left upper ext/shoulder pain. He has used botox injections in the past for that.   Also left lower ext spasms.   He has baclofen for this.  He request pain for left medial calf pain and has some left back side knee/popliteal area pain. Has brace on.    Review of Systems  Constitutional:  Negative for chills, fatigue and fever.  Respiratory:  Negative for cough, chest tightness, shortness of breath and wheezing.   Cardiovascular:  Negative for chest pain and palpitations.  Gastrointestinal:  Negative for abdominal pain, anal bleeding and blood in stool.  Genitourinary:  Negative for dysuria, flank pain and frequency.  Musculoskeletal:  Negative for back pain.       Left upper and lower ext weakness.  Calf and popliteal pain.  Neurological:  Negative for syncope, facial asymmetry, light-headedness and headaches.       Left upper and lower ext weakness. Poor coordination.  Hematological:  Negative for adenopathy.    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   PE (pulmonary thromboembolism) ([place])    08/01/19 non-occlusieve left posterior lower lobe segmental artery  PE   Stroke ([facility])    RICA, R A1, R MCA occlusion 07/19/19     Social History   Socioeconomic History   Marital status: Married    Spouse name: Melene Muller   Number of children: 2   Years of education: Not on file   Highest education level: Not on file  Occupational History   Occupation: unemployed 01/30/21  Tobacco Use   Smoking status: Former    Packs/day: 0.00    Types: Cigarettes    Quit date: 11/24/2015    Years since quitting: 6.7   Smokeless tobacco: Never   Tobacco comments:       Vaping Use   Vaping Use: Never used  Substance and Sexual Activity   Alcohol use: Yes    Comment: socially   Drug use: Never   Sexual activity: Not Currently  Other Topics Concern   Not on file  Social History Narrative   Lives with wife and kids   Right Handed   Drinks >10 cups caffeine daily   Social Determinants of Health   Financial Resource Strain: Not on file  Food Insecurity: Not on file  Transportation Needs: Not on file  Physical Activity: Not on file  Stress: Not on file  Social Connections: Not on file  Intimate Partner Violence: Not on file  Past Surgical History:  Procedure Laterality Date   CRANIOPLASTY Right 06/05/2020   Procedure: CRANIOPLASTY;  Surgeon: Consuella Lose, MD;  Location: [place];  Service: Neurosurgery;  Laterality: Right;  right   CRANIOTOMY Right 07/19/2019   Procedure: RIGHT HEMI-CRANIECTOMY With implantation of skull flap to abdominal wall;  Surgeon: Consuella Lose, MD;  Location: [place];  Service: Neurosurgery;  Laterality: Right;   CYST EXCISION N/A 10/08/2016   Procedure: EXCISION OF POSTERIOR NECK CYST;  Surgeon: Clovis Riley, MD;  [facility];  Service: General;  Laterality: N/A;   CYSTOSCOPY/URETEROSCOPY/HOLMIUM LASER/STENT PLACEMENT Right 03/16/2020   Procedure: CYSTOSCOPY RIGHT RETROGRADE PYELOGRAM URETEROSCOPY/HOLMIUM LASER/STENT PLACEMENT;  Surgeon: Lucas Mallow, MD;  [facility];  Service: Urology;  Laterality:  Right;   INCISION AND DRAINAGE ABSCESS N/A 09/22/2014   Procedure: INCISION AND DRAINAGE ABSCESS POSTERIOR NECK;  Surgeon: Pedro Earls, MD;  [facility];  Service: General;  Laterality: N/A;   INCISION AND DRAINAGE ABSCESS N/A 12/20/2015   Procedure: INCISION AND DRAINAGE POSTERIOR NECK MASS;  Surgeon: Armandina Gemma, MD;  [facility];  Service: General;  Laterality: N/A;   INCISION AND DRAINAGE ABSCESS Left 07/10/2004   middle finger   IR IVC FILTER PLMT / S&I /IMG GUID/MOD SED  07/21/2019   IR RADIOLOGIST EVAL & MGMT  12/14/2019   IR RADIOLOGIST EVAL & MGMT  12/21/2020   IR VENOGRAM RENAL UNI RIGHT  07/21/2019   MASS EXCISION N/A 07/21/2017   Procedure: EXCISION OF BENIGN NECK LESION WITH LAYERED CLOSURE;  Surgeon: Irene Limbo, MD;  Location: [place];  Service: Plastics;  Laterality: N/A;   MASS EXCISION N/A 11/10/2017   Procedure: EXCISION BENIGN LESION OF THE NECK WITH LAYERED CLOSURE;  Surgeon: Irene Limbo, MD;  Location: [place];  Service: Plastics;  Laterality: N/A;    Family History  Problem Relation Age of Onset   Healthy Mother    Healthy Father    Clotting disorder Maternal Grandmother    Diabetes Paternal Grandfather    Clotting disorder Maternal Aunt    Clotting disorder Maternal Uncle    Colon cancer Neg Hx     Allergies  Allergen Reactions   Hydrocodone Nausea Only    Current Outpatient Medications on File Prior to Visit  Medication Sig Dispense Refill   ASPIRIN LOW DOSE 81 MG tablet TAKE 1 TABLET (81 MG TOTAL) BY MOUTH DAILY. SWALLOW WHOLE. 30 tablet 0   atorvastatin (LIPITOR) 10 MG tablet TAKE 1 TABLET BY MOUTH EVERY DAY 90 tablet 1   baclofen (LIORESAL) 10 MG tablet Take 1 tablet (10 mg total) by mouth 3 (three) times daily. 270 tablet 1   metoprolol tartrate (LOPRESSOR) 50 MG tablet Take 1 tablet (50 mg total) by mouth 2 (two) times daily. 180 tablet 3   tiZANidine (ZANAFLEX) 4 MG tablet Take 1 tablet (4  mg total) by mouth at bedtime as needed for muscle spasms. 90 tablet 3   Current Facility-Administered Medications on File Prior to Visit  Medication Dose Route Frequency Provider Last Rate Last Admin   botulinum toxin Type A (BOTOX) injection 100 Units  100 Units Intramuscular Once Raulkar, Clide Deutscher, MD        BP 122/82   Pulse 75   Temp 98.6 F (37 C) (Oral)   Resp 18   Ht 6' (1.829 m)   SpO2 98%   BMI 32.28 kg/m        Objective:   Physical  Exam  General Mental Status- Alert. General Appearance- Not in acute distress.    Skin General: Color- Normal Color. Moisture- Normal Moisture.   Neck Carotid Arteries- Normal color. Moisture- Normal Moisture. No carotid bruits. No JVD.   Chest and Lung Exam Auscultation: Breath Sounds:-Normal.   Cardiovascular Auscultation:Rythm- Regular. Murmurs & Other Heart Sounds:Auscultation of the heart reveals- No Murmurs.   Abdomen Inspection:-Inspeection Normal. Palpation/Percussion:Note:No mass. Palpation and Percussion of the abdomen reveal- Non Tender, Non Distended + BS, no rebound or guarding.   Neurologic Cranial Nerve exam:- CN III-XII intact(No nystagmus), symmetric smile. Strength:- rt upper and lower ext normal. Left upper and lower ext- 1/5 weakness. Minimal range of motion.   Left lower ext- medial calf pain and + homans sign. Calf not swollen.     Assessment & Plan:   Patient Instructions  History of stroke/CVA.  Postcraniotomy.  Since then left upper extremity and lower extremity weakness.  Difficulty with ADLs.  On discussion particularly cooking and self-care/bathing himself.  Patient has requested assistance for personal care services.  I think that is reasonable and will fax paperwork over when.  Hypertension-blood pressure reasonably controlled today.  Continue metoprolol 50 mg twice daily.  Hyperlipidemia-on atorvastatin 10 mg daily.  Left shoulder and arm pain since stroke.  Patient describes getting Botox  injections.  He describes 1 upcoming in the near future.  Left lower extremity spasms as well.  He is on baclofen.  Today he has new onset left calf pain.  Hurts for him to walk.  He has left lower extremity brace on and states that it hurts his calf.  We will get a left lower extremity ultrasound stat today.  We will get CMP, A1c and lipid panel today as well.  If ultrasound studies are negative then we will try to reach out to our physical medicine rehab doctors/send message to get input on pain management.  Follow-up date to be determined after lab review.   Mackie Pai, PA-C

## 2022-08-16 LAB — HEMOGLOBIN A1C
Hgb A1c MFr Bld: 5.8 % of total Hgb — ABNORMAL HIGH (ref <5.7)
Mean Plasma Glucose: 120 mg/dL
eAG (mmol/L): 6.6 mmol/L

## 2022-08-16 LAB — LIPID PANEL
Cholesterol: 136 mg/dL (ref <200)
HDL: 32 mg/dL — ABNORMAL LOW (ref >=40)
LDL Cholesterol (Calc): 83 mg/dL (calc)
Non-HDL Cholesterol (Calc): 104 mg/dL (calc) (ref <130)
Total CHOL/HDL Ratio: 4.3 (calc) (ref <5.0)
Triglycerides: 114 mg/dL (ref <150)

## 2022-08-16 LAB — COMPREHENSIVE METABOLIC PANEL
AG Ratio: 1.2 (calc) (ref 1.0–2.5)
ALT: 32 U/L (ref 9–46)
AST: 26 U/L (ref 10–40)
Albumin: 4.2 g/dL (ref 3.6–5.1)
Alkaline phosphatase (APISO): 149 U/L — ABNORMAL HIGH (ref 36–130)
BUN: 11 mg/dL (ref 7–25)
CO2: 26 mmol/L (ref 20–32)
Calcium: 9.6 mg/dL (ref 8.6–10.3)
Chloride: 107 mmol/L (ref 98–110)
Creat: 1.14 mg/dL (ref 0.60–1.29)
Globulin: 3.6 g/dL (calc) (ref 1.9–3.7)
Glucose, Bld: 84 mg/dL (ref 65–99)
Potassium: 4.5 mmol/L (ref 3.5–5.3)
Sodium: 142 mmol/L (ref 135–146)
Total Bilirubin: 0.6 mg/dL (ref 0.2–1.2)
Total Protein: 7.8 g/dL (ref 6.1–8.1)

## 2022-08-17 DIAGNOSIS — R079 Chest pain, unspecified: Secondary | ICD-10-CM | POA: Diagnosis not present

## 2022-08-17 DIAGNOSIS — R0789 Other chest pain: Secondary | ICD-10-CM | POA: Diagnosis not present

## 2022-08-18 ENCOUNTER — Ambulatory Visit: Payer: Medicaid Other | Admitting: Occupational Therapy

## 2022-08-18 ENCOUNTER — Encounter: Payer: Self-pay | Admitting: Occupational Therapy

## 2022-08-18 DIAGNOSIS — R208 Other disturbances of skin sensation: Secondary | ICD-10-CM

## 2022-08-18 DIAGNOSIS — R2681 Unsteadiness on feet: Secondary | ICD-10-CM | POA: Diagnosis not present

## 2022-08-18 DIAGNOSIS — R29898 Other symptoms and signs involving the musculoskeletal system: Secondary | ICD-10-CM | POA: Diagnosis not present

## 2022-08-18 DIAGNOSIS — R482 Apraxia: Secondary | ICD-10-CM | POA: Diagnosis not present

## 2022-08-18 DIAGNOSIS — G8929 Other chronic pain: Secondary | ICD-10-CM | POA: Diagnosis not present

## 2022-08-18 DIAGNOSIS — I69354 Hemiplegia and hemiparesis following cerebral infarction affecting left non-dominant side: Secondary | ICD-10-CM | POA: Diagnosis not present

## 2022-08-18 DIAGNOSIS — R293 Abnormal posture: Secondary | ICD-10-CM | POA: Diagnosis not present

## 2022-08-18 DIAGNOSIS — M6281 Muscle weakness (generalized): Secondary | ICD-10-CM | POA: Diagnosis not present

## 2022-08-18 DIAGNOSIS — M25612 Stiffness of left shoulder, not elsewhere classified: Secondary | ICD-10-CM

## 2022-08-18 DIAGNOSIS — M25512 Pain in left shoulder: Secondary | ICD-10-CM | POA: Diagnosis not present

## 2022-08-18 NOTE — Therapy (Signed)
OUTPATIENT OCCUPATIONAL THERAPY NEURO EVALUATION  Patient Name: Matthew Black MRN: [id number] DOB:July 25, 1976, 46 y.o., male Today's Date: 08/18/2022  PCP: Dr. [name] REFERRING PROVIDER: Dr. Ranell Patrick   OT End of Session - 08/18/22 1818     Visit Number 3    Number of Visits 8    Date for OT Re-Evaluation 09/25/22    Authorization Type Medicaid-Healthy Blue await auth    Authorization Time Period start date for tx  08/07/22 (Pt has used approx 8 visits for MCD this year)    OT Start Time 1600    OT Stop Time 1643    OT Time Calculation (min) 43 min    Equipment Utilized During Treatment floatation,    Activity Tolerance Patient tolerated treatment well    Behavior During Therapy [facility] for tasks assessed/performed             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   Depression    DVT (deep venous thrombosis) ([place])    BLE DVT 07/21/19, 08/02/19; s/p retrievable IVC filter 07/21/19   Hemorrhagic stroke ([place])    History of kidney stones    Hypertension    Paralysis ([place])    LEFT SIDE   PE (pulmonary thromboembolism) ([place])    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   Stroke ([place])    RICA, R A1, R MCA occlusion 07/19/19   Past Surgical History:  Procedure Laterality Date   CRANIOPLASTY Right 06/05/2020   Procedure: CRANIOPLASTY;  Surgeon: Consuella Lose, MD;  Location: [place];  Service: Neurosurgery;  Laterality: Right;  right   CRANIOTOMY Right 07/19/2019   Procedure: RIGHT HEMI-CRANIECTOMY With implantation of skull flap to abdominal wall;  Surgeon: Consuella Lose, MD;  Location: [place];  Service: Neurosurgery;  Laterality: Right;   CYST EXCISION N/A 10/08/2016   Procedure: EXCISION OF POSTERIOR NECK CYST;  Surgeon: Clovis Riley, MD;  [facility];  Service: General;  Laterality: N/A;   CYSTOSCOPY/URETEROSCOPY/HOLMIUM LASER/STENT PLACEMENT Right 03/16/2020   Procedure: CYSTOSCOPY RIGHT RETROGRADE PYELOGRAM  URETEROSCOPY/HOLMIUM LASER/STENT PLACEMENT;  Surgeon: Lucas Mallow, MD;  [facility];  Service: Urology;  Laterality: Right;   INCISION AND DRAINAGE ABSCESS N/A 09/22/2014   Procedure: INCISION AND DRAINAGE ABSCESS POSTERIOR NECK;  Surgeon: Pedro Earls, MD;  [facility];  Service: General;  Laterality: N/A;   INCISION AND DRAINAGE ABSCESS N/A 12/20/2015   Procedure: INCISION AND DRAINAGE POSTERIOR NECK MASS;  Surgeon: Armandina Gemma, MD;  [facility];  Service: General;  Laterality: N/A;   INCISION AND DRAINAGE ABSCESS Left 07/10/2004   middle finger   IR IVC FILTER PLMT / S&I /IMG GUID/MOD SED  07/21/2019   IR RADIOLOGIST EVAL & MGMT  12/14/2019   IR RADIOLOGIST EVAL & MGMT  12/21/2020   IR VENOGRAM RENAL UNI RIGHT  07/21/2019   MASS EXCISION N/A 07/21/2017   Procedure: EXCISION OF BENIGN NECK LESION WITH LAYERED CLOSURE;  Surgeon: Irene Limbo, MD;  Location: [place];  Service: Plastics;  Laterality: N/A;   MASS EXCISION N/A 11/10/2017   Procedure: EXCISION BENIGN LESION OF THE NECK WITH LAYERED CLOSURE;  Surgeon: Irene Limbo, MD;  Location: [place];  Service: Plastics;  Laterality: N/A;   Patient Active Problem List   Diagnosis Date Noted   COVID-19 virus infection 04/21/2021   Left-sided weakness 04/21/2021   S/P craniotomy 06/05/2020   History of cranioplasty 06/05/2020   Peri-rectal  abscess 02/14/2020   Abnormal CT scan, pelvis 02/14/2020   Pancytopenia ([place]) 12/02/2019   Nephrolithiasis 12/02/2019   Hydronephrosis with renal and ureteral calculus obstruction 12/02/2019   Rectal pain 12/02/2019   Hyperkalemia 12/02/2019   Reactive depression    Wound infection after surgery    Sleep disturbance    Dysphagia, post-stroke    Transaminitis    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   Cerebral abscess    Urinary tract infection without hematuria    Altered mental status    Primary hypercoagulable  state ([place])    Acute pulmonary embolism without acute cor pulmonale (HCC)    Deep vein thrombosis (DVT) of non-extremity vein    Hypokalemia    Acute blood loss anemia    Leukocytosis    Endotracheal tube present    Acute respiratory failure with hypoxemia (HCC)    Stroke (cerebrum) ([place]) 07/19/2019   Pressure injury of skin 07/19/2019   Acute CVA (cerebrovascular accident) ([place])    Encephalopathy    Dysphagia    Acute encephalopathy    Essential hypertension    Obesity 03/12/2016   Scalp abscess 12/20/2015   Neck abscess 12/20/2015   Pilonidal cyst 02/08/2013    ONSET DATE: 06/10/22  REFERRING DIAG: Z61.096,E45.4 (ICD-10-CM) - 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 (CVA)  THERAPY DIAG:  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  PMH: hx of seizures and headaches s/p CVA, hx of DVT with IVC filter placement . Pt s/p botox on 06/10/22, possible seizure in June 2023    Limitations fall risk, hx of seizures   PRECAUTIONS: Fall and Other: seizures - possible seizure in June?  WEIGHT BEARING RESTRICTIONS No  PAIN:  Are you having pain? Yes: NPRS scale: 6/10 Pain location: LUE Pain description: aching Aggravating factors: movement Relieving factors: rest    LIVING ENVIRONMENT: Lives with: lives with their spouse Lives in: House/apartment PLOF: Needs assistance with ADLs and Needs  assistance with homemaking  PATIENT GOALS aquatic therapy  OBJECTIVE:   HAND DOMINANCE: Right  ADLs: Overall ADLs: pt requires assistance at times Transfers/ambulation related to ADLs: Eating: mod I Grooming: mod I UB Dressing: mod I LB Dressing: mod A with shoes and socks Toileting: mod I with cane per pt report Bathing: min A sponge bathing for RUE    IADLs: Shopping: dependent Light housekeeping: dependent Meal Prep: needs assist  Medication management: pt's  Financial management: dependent   MOBILITY STATUS:  amb short distances with a cane, uses w/c  POSTURE COMMENTS:    ACTIVITY TOLERANCE: Activity tolerance: fatiques quickly  FUNCTIONAL OUTCOME MEASURES: Functional assessment questionnaire: 23/64 (64 indicates normal)  UPPER EXTREMITY ROM     Passive ROM Right eval Left eval  Shoulder flexion  80  Shoulder abduction  80  Shoulder adduction    Shoulder extension    Shoulder internal rotation    Shoulder external rotation    Elbow flexion  120  Elbow extension  -20  Wrist  flexion    Wrist extension    Wrist ulnar deviation    Wrist radial deviation    Wrist pronation    Wrist supination    No active movement in LUE   HAND FUNCTION: no active movment in LUE     COGNITION: Overall cognitive status: Impaired, delayed processing, short term memory deficits   OBSERVATIONS: Pt demonstrates delayed response time to questions   TODAY'S TREATMENT:  08/18/22:  Patient seen for aquatic therapy visit.  Patient entered and exited the pool via stairs and right hand rail with step to pattern.  Treatment occurred in 3.5- 4.5 ft of warm water.  Patient initally requiring BUE assist for walking in waist to chest deep water, however, after stretching and preparing left ankle, and working on isolating weight shift toward left - patient able to walk unaided x 10 feet x 1.  In seated position worked on range of motion to LUE.  Patient with ability to  passively extend elbow fully.  Patient with increased tension and at times report of discomfort in left shoulder (pects region.)  Supine with floatation equipment to address pect stretch, and address body on arm movement to decrease left trunk and left limb tension.    PATIENT EDUCATION: Education details: role of OT, and that pt will need someone to accompany him if he goes to the pool for aquatic therapy Person educated: Patient Education method: Explanation Education comprehension: verbalized understanding   HOME EXERCISE PROGRAM: N/A    GOALS:  LONG TERM GOALS: Target date: 09/25/22 Pt will verbalize understanding of adapted strategies to maximize pt I with ADLS/IADLs(opening sandwich containers, open loaf of bread, plastic bags) Baseline: dependent Goal status: INITIAL  2.  I with updated HEP  Baseline: dependent Goal status: INITIAL  3.  Pt will complete an aquatic exercise program designed to increase LUE ROM and minimize pain Baseline: dependent Goal status: INITIAL  ASSESSMENT:  CLINICAL IMPRESSION: Patient arrived late for aquatic therapy session, but able to rearrange schedule to still allow him to be seen.  Patient relies on family members for transportation to pool.      PERFORMANCE DEFICITS in functional skills including ADLs, IADLs, coordination, dexterity, proprioception, sensation, edema, tone, ROM, strength, pain, flexibility, FMC, GMC, mobility, balance, endurance, decreased knowledge of precautions, decreased knowledge of use of DME, and UE functional use, cognitive skills including energy/drive, memory, thought, and understand, and psychosocial skills including coping strategies, environmental adaptation, habits, interpersonal interactions, and routines and behaviors.   IMPAIRMENTS are limiting patient from ADLs, IADLs, work, play, leisure, and social participation.   COMORBIDITIES may have co-morbidities  that affects occupational performance. Patient will  benefit from skilled OT to address above impairments and improve overall function.  MODIFICATION OR ASSISTANCE TO COMPLETE EVALUATION: No modification of tasks or assist necessary to complete an evaluation.  OT OCCUPATIONAL PROFILE AND HISTORY: Problem focused assessment: Including review of records relating to presenting problem.  CLINICAL DECISION MAKING: LOW - limited treatment options, no task modification necessary  REHAB POTENTIAL: Good  EVALUATION COMPLEXITY: Low    PLAN: OT FREQUENCY: 1x/week start 08/07/22  OT DURATION: 8 weeks  PLANNED INTERVENTIONS: self care/ADL training, therapeutic exercise, therapeutic activity, neuromuscular re-education, manual therapy, passive range of motion, balance training, aquatic therapy, splinting, electrical stimulation, ultrasound, paraffin, fluidotherapy, moist heat, cryotherapy, contrast bath, patient/family education, cognitive remediation/compensation, energy conservation, coping strategies training, and DME and/or AE instructions  RECOMMENDED OTHER SERVICES: n/a  CONSULTED AND AGREED WITH PLAN OF CARE: Patient  PLAN  FOR NEXT SESSION: aquatic therapy  Managed medicaid CPT codes: [phone]- Therapeutic Exercise, [phone]- Neuro Re-education, [phone] - Gait Training, [phone] - Manual Therapy, [id number] - Therapeutic Activities, [phone] - Self Care, [phone] - Electrical stimulation (unattended), [id number] - Ultrasound, [id number] - Orthotic Fit, [id number] - Aquatic therapy, [id number] - Fluidotherapy, [id number] -  Paraffin, [id number] - Cognitive training (First 15 min), and 26415 - Cognitive training (each additional 15 min)    Mariah Milling, OT 08/18/2022, 6:19 PM

## 2022-08-19 NOTE — Progress Notes (Signed)
This encounter was created in error - please disregard.

## 2022-08-25 ENCOUNTER — Encounter: Payer: Medicaid Other | Admitting: Occupational Therapy

## 2022-08-25 ENCOUNTER — Encounter: Payer: Self-pay | Admitting: Occupational Therapy

## 2022-08-25 ENCOUNTER — Ambulatory Visit: Payer: Medicaid Other | Attending: Medical | Admitting: Occupational Therapy

## 2022-08-25 DIAGNOSIS — R2681 Unsteadiness on feet: Secondary | ICD-10-CM

## 2022-08-25 DIAGNOSIS — R482 Apraxia: Secondary | ICD-10-CM | POA: Diagnosis not present

## 2022-08-25 DIAGNOSIS — R29898 Other symptoms and signs involving the musculoskeletal system: Secondary | ICD-10-CM

## 2022-08-25 DIAGNOSIS — R293 Abnormal posture: Secondary | ICD-10-CM

## 2022-08-25 DIAGNOSIS — M25612 Stiffness of left shoulder, not elsewhere classified: Secondary | ICD-10-CM | POA: Diagnosis not present

## 2022-08-25 DIAGNOSIS — I69354 Hemiplegia and hemiparesis following cerebral infarction affecting left non-dominant side: Secondary | ICD-10-CM

## 2022-08-25 DIAGNOSIS — M25512 Pain in left shoulder: Secondary | ICD-10-CM | POA: Insufficient documentation

## 2022-08-25 DIAGNOSIS — M6281 Muscle weakness (generalized): Secondary | ICD-10-CM

## 2022-08-25 DIAGNOSIS — R208 Other disturbances of skin sensation: Secondary | ICD-10-CM | POA: Diagnosis not present

## 2022-08-25 DIAGNOSIS — G8929 Other chronic pain: Secondary | ICD-10-CM

## 2022-08-25 NOTE — Therapy (Signed)
OUTPATIENT OCCUPATIONAL THERAPY PROGRESS NOTE Patient Name: Matthew Black MRN: [id number] DOB:10/26/1976, 46 y.o., male Today's Date: 08/25/2022  PCP: Dr. [name] REFERRING PROVIDER: Dr. Ranell Patrick   OT End of Session - 08/25/22 1602     Visit Number 4    Number of Visits 8    Date for OT Re-Evaluation 09/25/22    Authorization Type Medicaid-Healthy Blue await auth    Authorization Time Period start date for tx  08/07/22 (Pt has used approx 8 visits for MCD this year)    OT Start Time 62    OT Stop Time 1550    OT Time Calculation (min) 43 min    Equipment Utilized During Treatment floatation,    Activity Tolerance Patient tolerated treatment well    Behavior During Therapy [facility] for tasks assessed/performed             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   Depression    DVT (deep venous thrombosis) ([place])    BLE DVT 07/21/19, 08/02/19; s/p retrievable IVC filter 07/21/19   Hemorrhagic stroke ([place])    History of kidney stones    Hypertension    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   PE (pulmonary thromboembolism) ([place])    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   Stroke ([place])    RICA, R A1, R MCA occlusion 07/19/19   Past Surgical History:  Procedure Laterality Date   CRANIOPLASTY Right 06/05/2020   Procedure: CRANIOPLASTY;  Surgeon: Consuella Lose, MD;  Location: [place];  Service: Neurosurgery;  Laterality: Right;  right   CRANIOTOMY Right 07/19/2019   Procedure: RIGHT HEMI-CRANIECTOMY With implantation of skull flap to abdominal wall;  Surgeon: Consuella Lose, MD;  Location: [place];  Service: Neurosurgery;  Laterality: Right;   CYST EXCISION N/A 10/08/2016   Procedure: EXCISION OF POSTERIOR NECK CYST;  Surgeon: Clovis Riley, MD;  [facility];  Service: General;  Laterality: N/A;   CYSTOSCOPY/URETEROSCOPY/HOLMIUM LASER/STENT PLACEMENT Right 03/16/2020   Procedure: CYSTOSCOPY RIGHT RETROGRADE PYELOGRAM  URETEROSCOPY/HOLMIUM LASER/STENT PLACEMENT;  Surgeon: Lucas Mallow, MD;  [facility];  Service: Urology;  Laterality: Right;   INCISION AND DRAINAGE ABSCESS N/A 09/22/2014   Procedure: INCISION AND DRAINAGE ABSCESS POSTERIOR NECK;  Surgeon: Pedro Earls, MD;  [facility];  Service: General;  Laterality: N/A;   INCISION AND DRAINAGE ABSCESS N/A 12/20/2015   Procedure: INCISION AND DRAINAGE POSTERIOR NECK MASS;  Surgeon: Armandina Gemma, MD;  [facility];  Service: General;  Laterality: N/A;   INCISION AND DRAINAGE ABSCESS Left 07/10/2004   middle finger   IR IVC FILTER PLMT / S&I /IMG GUID/MOD SED  07/21/2019   IR RADIOLOGIST EVAL & MGMT  12/14/2019   IR RADIOLOGIST EVAL & MGMT  12/21/2020   IR VENOGRAM RENAL UNI RIGHT  07/21/2019   MASS EXCISION N/A 07/21/2017   Procedure: EXCISION OF BENIGN NECK LESION WITH LAYERED CLOSURE;  Surgeon: Irene Limbo, MD;  Location: [place];  Service: Plastics;  Laterality: N/A;   MASS EXCISION N/A 11/10/2017   Procedure: EXCISION BENIGN LESION OF THE NECK WITH LAYERED CLOSURE;  Surgeon: Irene Limbo, MD;  Location: [place];  Service: Plastics;  Laterality: N/A;   Patient Active Problem List   Diagnosis Date Noted   COVID-19 virus infection 04/21/2021   Left-sided weakness 04/21/2021   S/P craniotomy 06/05/2020   History of cranioplasty 06/05/2020   Peri-rectal abscess  02/14/2020   Abnormal CT scan, pelvis 02/14/2020   Pancytopenia ([place]) 12/02/2019   Nephrolithiasis 12/02/2019   Hydronephrosis with renal and ureteral calculus obstruction 12/02/2019   Rectal pain 12/02/2019   Hyperkalemia 12/02/2019   Reactive depression    Wound infection after surgery    Sleep disturbance    Dysphagia, post-stroke    Transaminitis    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   Cerebral abscess    Urinary tract infection without hematuria    Altered mental status    Primary hypercoagulable  state ([place])    Acute pulmonary embolism without acute cor pulmonale (HCC)    Deep vein thrombosis (DVT) of non-extremity vein    Hypokalemia    Acute blood loss anemia    Leukocytosis    Endotracheal tube present    Acute respiratory failure with hypoxemia (HCC)    Stroke (cerebrum) ([place]) 07/19/2019   Pressure injury of skin 07/19/2019   Acute CVA (cerebrovascular accident) ([place])    Encephalopathy    Dysphagia    Acute encephalopathy    Essential hypertension    Obesity 03/12/2016   Scalp abscess 12/20/2015   Neck abscess 12/20/2015   Pilonidal cyst 02/08/2013    ONSET DATE: 06/10/22  REFERRING DIAG: S49.675,F16.3 (ICD-10-CM) - 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 (CVA)  THERAPY DIAG:  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  PMH: hx of seizures and headaches s/p CVA, hx of DVT with IVC filter placement . Pt s/p botox on 06/10/22, possible seizure in June 2023    Limitations fall risk, hx of seizures   PRECAUTIONS: Fall and Other: seizures - possible seizure in June?  WEIGHT BEARING RESTRICTIONS No  PAIN:  Are you having pain? Yes: NPRS scale: 6/10 Pain location: LUE Pain description: aching Aggravating factors: movement Relieving factors: rest    LIVING ENVIRONMENT: Lives with: lives with their spouse Lives in: House/apartment PLOF: Needs assistance with ADLs and Needs assistance with  homemaking  PATIENT GOALS aquatic therapy  OBJECTIVE:   HAND DOMINANCE: Right  ADLs: Overall ADLs: pt requires assistance at times Transfers/ambulation related to ADLs: Eating: mod I Grooming: mod I UB Dressing: mod I LB Dressing: mod A with shoes and socks Toileting: mod I with cane per pt report Bathing: min A sponge bathing for RUE    IADLs: Shopping: dependent Light housekeeping: dependent Meal Prep: needs assist  Medication management: pt's  Financial management: dependent   MOBILITY STATUS:  amb short distances with a cane, uses w/c  POSTURE COMMENTS:    ACTIVITY TOLERANCE: Activity tolerance: fatiques quickly  FUNCTIONAL OUTCOME MEASURES: Functional assessment questionnaire: 23/64 (64 indicates normal)  UPPER EXTREMITY ROM     Passive ROM Right eval Left eval  Shoulder flexion  80  Shoulder abduction  80  Shoulder adduction    Shoulder extension    Shoulder internal rotation    Shoulder external rotation    Elbow flexion  120  Elbow extension  -20  Wrist flexion  Wrist extension    Wrist ulnar deviation    Wrist radial deviation    Wrist pronation    Wrist supination    No active movement in LUE   HAND FUNCTION: no active movment in LUE     COGNITION: Overall cognitive status: Impaired, delayed processing, short term memory deficits   OBSERVATIONS: Pt demonstrates delayed response time to questions   TODAY'S TREATMENT:  08/25/22  Patient seen for aquatic therapy visit.  Patient entered and exited the pool via stairs and right hand rail with step to pattern and min assist.  Treatment occurred in 3.5- 4.8 ft of warm water.  Patient initally requiring BUE assist for walking in waist to chest deep water, however, after stretching and preparing left ankle, and working on isolating weight shift toward left - patient able to walk unaided x 20 feet x 5.  Supine with floatation equipment to address pect stretch, and address body on arm  movement to decrease left trunk and left limb tension.   Used buoyancy to address range of motion to LUE to promote decreased stiffness.   PATIENT EDUCATION: Education details: role of OT, and that pt will need someone to accompany him if he goes to the pool for aquatic therapy Person educated: Patient Education method: Explanation Education comprehension: verbalized understanding   HOME EXERCISE PROGRAM: N/A    GOALS:  LONG TERM GOALS: Target date: 09/25/22 Pt will verbalize understanding of adapted strategies to maximize pt I with ADLS/IADLs(opening sandwich containers, open loaf of bread, plastic bags) Baseline: dependent Goal status: INITIAL  2.  I with updated HEP  Baseline: dependent Goal status: INITIAL  3.  Pt will complete an aquatic exercise program designed to increase LUE ROM and minimize pain Baseline: dependent Goal status: INITIAL  ASSESSMENT:  CLINICAL IMPRESSION: Patient reports significant back, leg, shoulder pain due to fall prior to session.  Pain relieved after session per patient report.       PERFORMANCE DEFICITS in functional skills including ADLs, IADLs, coordination, dexterity, proprioception, sensation, edema, tone, ROM, strength, pain, flexibility, FMC, GMC, mobility, balance, endurance, decreased knowledge of precautions, decreased knowledge of use of DME, and UE functional use, cognitive skills including energy/drive, memory, thought, and understand, and psychosocial skills including coping strategies, environmental adaptation, habits, interpersonal interactions, and routines and behaviors.   IMPAIRMENTS are limiting patient from ADLs, IADLs, work, play, leisure, and social participation.   COMORBIDITIES may have co-morbidities  that affects occupational performance. Patient will benefit from skilled OT to address above impairments and improve overall function.  MODIFICATION OR ASSISTANCE TO COMPLETE EVALUATION: No modification of tasks or assist  necessary to complete an evaluation.  OT OCCUPATIONAL PROFILE AND HISTORY: Problem focused assessment: Including review of records relating to presenting problem.  CLINICAL DECISION MAKING: LOW - limited treatment options, no task modification necessary  REHAB POTENTIAL: Good  EVALUATION COMPLEXITY: Low    PLAN: OT FREQUENCY: 1x/week start 08/07/22  OT DURATION: 8 weeks  PLANNED INTERVENTIONS: self care/ADL training, therapeutic exercise, therapeutic activity, neuromuscular re-education, manual therapy, passive range of motion, balance training, aquatic therapy, splinting, electrical stimulation, ultrasound, paraffin, fluidotherapy, moist heat, cryotherapy, contrast bath, patient/family education, cognitive remediation/compensation, energy conservation, coping strategies training, and DME and/or AE instructions  RECOMMENDED OTHER SERVICES: n/a  CONSULTED AND AGREED WITH PLAN OF CARE: Patient  PLAN FOR NEXT SESSION: aquatic therapy  Managed medicaid CPT codes: [phone]- Therapeutic Exercise, [id number]- Neuro Re-education, [phone] - Gait Training, [id number] - Manual Therapy, [id number] - Therapeutic Activities, [id number] -  Self Care, [phone] - Electrical stimulation (unattended), [id number] - Ultrasound, [id number] - Orthotic Fit, [id number] - Aquatic therapy, [id number] - Fluidotherapy, [id number] -  Paraffin, [id number] - Cognitive training (First 15 min), and [id number] - Cognitive training (each additional 15 min)    Mariah Milling, OT 08/25/2022, 4:03 PM

## 2022-09-01 ENCOUNTER — Encounter: Payer: Self-pay | Admitting: Occupational Therapy

## 2022-09-01 ENCOUNTER — Ambulatory Visit: Payer: Medicaid Other | Admitting: Occupational Therapy

## 2022-09-01 DIAGNOSIS — I69354 Hemiplegia and hemiparesis following cerebral infarction affecting left non-dominant side: Secondary | ICD-10-CM

## 2022-09-01 DIAGNOSIS — R293 Abnormal posture: Secondary | ICD-10-CM | POA: Diagnosis not present

## 2022-09-01 DIAGNOSIS — M25612 Stiffness of left shoulder, not elsewhere classified: Secondary | ICD-10-CM | POA: Diagnosis not present

## 2022-09-01 DIAGNOSIS — R482 Apraxia: Secondary | ICD-10-CM | POA: Diagnosis not present

## 2022-09-01 DIAGNOSIS — R29898 Other symptoms and signs involving the musculoskeletal system: Secondary | ICD-10-CM | POA: Diagnosis not present

## 2022-09-01 DIAGNOSIS — M6281 Muscle weakness (generalized): Secondary | ICD-10-CM | POA: Diagnosis not present

## 2022-09-01 DIAGNOSIS — G8929 Other chronic pain: Secondary | ICD-10-CM | POA: Diagnosis not present

## 2022-09-01 DIAGNOSIS — R208 Other disturbances of skin sensation: Secondary | ICD-10-CM

## 2022-09-01 DIAGNOSIS — R2681 Unsteadiness on feet: Secondary | ICD-10-CM | POA: Diagnosis not present

## 2022-09-01 DIAGNOSIS — M25512 Pain in left shoulder: Secondary | ICD-10-CM | POA: Diagnosis not present

## 2022-09-01 NOTE — Therapy (Signed)
OUTPATIENT OCCUPATIONAL THERAPY PROGRESS NOTE Patient Name: Matthew Black MRN: [id number] DOB:Aug 07, 1976, 46 y.o., male Today's Date: 09/01/2022  PCP: Dr. [name] REFERRING PROVIDER: Dr. Ranell Patrick   OT End of Session - 09/01/22 [place]     Visit Number 5    Number of Visits 8    Date for OT Re-Evaluation 09/25/22    Authorization Type Medicaid-Healthy Blue await auth    Authorization Time Period start date for tx  08/07/22 (Pt has used approx 8 visits for MCD this year)    OT Start Time 1500    OT Stop Time 1545    OT Time Calculation (min) 45 min    Equipment Utilized During Treatment floatation,    Activity Tolerance Patient tolerated treatment well    Behavior During Therapy [facility] for tasks assessed/performed             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   Depression    DVT (deep venous thrombosis) ([place])    BLE DVT 07/21/19, 08/02/19; s/p retrievable IVC filter 07/21/19   Hemorrhagic stroke ([place])    History of kidney stones    Hypertension    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   PE (pulmonary thromboembolism) ([place])    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   Stroke ([place])    RICA, R A1, R MCA occlusion 07/19/19   Past Surgical History:  Procedure Laterality Date   CRANIOPLASTY Right 06/05/2020   Procedure: CRANIOPLASTY;  Surgeon: Consuella Lose, MD;  Location: [place];  Service: Neurosurgery;  Laterality: Right;  right   CRANIOTOMY Right 07/19/2019   Procedure: RIGHT HEMI-CRANIECTOMY With implantation of skull flap to abdominal wall;  Surgeon: Consuella Lose, MD;  Location: [place];  Service: Neurosurgery;  Laterality: Right;   CYST EXCISION N/A 10/08/2016   Procedure: EXCISION OF POSTERIOR NECK CYST;  Surgeon: Clovis [place], MD;  [facility];  Service: General;  Laterality: N/A;   CYSTOSCOPY/URETEROSCOPY/HOLMIUM LASER/STENT PLACEMENT Right 03/16/2020   Procedure: CYSTOSCOPY RIGHT RETROGRADE PYELOGRAM  URETEROSCOPY/HOLMIUM LASER/STENT PLACEMENT;  Surgeon: Lucas Mallow, MD;  [facility];  Service: Urology;  Laterality: Right;   INCISION AND DRAINAGE ABSCESS N/A 09/22/2014   Procedure: INCISION AND DRAINAGE ABSCESS POSTERIOR NECK;  Surgeon: Pedro Earls, MD;  [facility];  Service: General;  Laterality: N/A;   INCISION AND DRAINAGE ABSCESS N/A 12/20/2015   Procedure: INCISION AND DRAINAGE POSTERIOR NECK MASS;  Surgeon: Armandina Gemma, MD;  [facility];  Service: General;  Laterality: N/A;   INCISION AND DRAINAGE ABSCESS Left 07/10/2004   middle finger   IR IVC FILTER PLMT / S&I /IMG GUID/MOD SED  07/21/2019   IR RADIOLOGIST EVAL & MGMT  12/14/2019   IR RADIOLOGIST EVAL & MGMT  12/21/2020   IR VENOGRAM RENAL UNI RIGHT  07/21/2019   MASS EXCISION N/A 07/21/2017   Procedure: EXCISION OF BENIGN NECK LESION WITH LAYERED CLOSURE;  Surgeon: Irene Limbo, MD;  Location: [place];  Service: Plastics;  Laterality: N/A;   MASS EXCISION N/A 11/10/2017   Procedure: EXCISION BENIGN LESION OF THE NECK WITH LAYERED CLOSURE;  Surgeon: Irene Limbo, MD;  Location: [place];  Service: Plastics;  Laterality: N/A;   Patient Active Problem List   Diagnosis Date Noted   COVID-19 virus infection 04/21/2021   Left-sided weakness 04/21/2021   S/P craniotomy 06/05/2020   History of cranioplasty 06/05/2020   Peri-rectal abscess  02/14/2020   Abnormal CT scan, pelvis 02/14/2020   Pancytopenia ([place]) 12/02/2019   Nephrolithiasis 12/02/2019   Hydronephrosis with renal and ureteral calculus obstruction 12/02/2019   Rectal pain 12/02/2019   Hyperkalemia 12/02/2019   Reactive depression    Wound infection after surgery    Sleep disturbance    Dysphagia, post-stroke    Transaminitis    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   Cerebral abscess    Urinary tract infection without hematuria    Altered mental status    Primary hypercoagulable  state ([place])    Acute pulmonary embolism without acute cor pulmonale (HCC)    Deep vein thrombosis (DVT) of non-extremity vein    Hypokalemia    Acute blood loss anemia    Leukocytosis    Endotracheal tube present    Acute respiratory failure with hypoxemia (HCC)    Stroke (cerebrum) ([place]) 07/19/2019   Pressure injury of skin 07/19/2019   Acute CVA (cerebrovascular accident) ([place])    Encephalopathy    Dysphagia    Acute encephalopathy    Essential hypertension    Obesity 03/12/2016   Scalp abscess 12/20/2015   Neck abscess 12/20/2015   Pilonidal cyst 02/08/2013    ONSET DATE: 06/10/22  REFERRING DIAG: U27.253,G64.4 (ICD-10-CM) - 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 (CVA)  THERAPY DIAG:  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  PMH: hx of seizures and headaches s/p CVA, hx of DVT with IVC filter placement . Pt s/p botox on 06/10/22, possible seizure in June 2023    Limitations fall risk, hx of seizures   PRECAUTIONS: Fall and Other: seizures - possible seizure in June?  WEIGHT BEARING RESTRICTIONS No  PAIN:  Are you having pain? Yes: NPRS scale: 6/10 Pain location: LUE Pain description: aching Aggravating factors: movement Relieving factors: rest    LIVING ENVIRONMENT: Lives with: lives with their spouse Lives in: House/apartment PLOF: Needs assistance with ADLs and Needs assistance with homemaking  PATIENT GOALS aquatic therapy  OBJECTIVE:   HAND  DOMINANCE: Right  ADLs: Overall ADLs: pt requires assistance at times Transfers/ambulation related to ADLs: Eating: mod I Grooming: mod I UB Dressing: mod I LB Dressing: mod A with shoes and socks Toileting: mod I with cane per pt report Bathing: min A sponge bathing for RUE    IADLs: Shopping: dependent Light housekeeping: dependent Meal Prep: needs assist  Medication management: pt's  Financial management: dependent   MOBILITY STATUS:  amb short distances with a cane, uses w/c  POSTURE COMMENTS:    ACTIVITY TOLERANCE: Activity tolerance: fatiques quickly  FUNCTIONAL OUTCOME MEASURES: Functional assessment questionnaire: 23/64 (64 indicates normal)  UPPER EXTREMITY ROM     Passive ROM Right eval Left eval  Shoulder flexion  80  Shoulder abduction  80  Shoulder adduction    Shoulder extension    Shoulder internal rotation    Shoulder external rotation    Elbow flexion  120  Elbow extension  -20  Wrist flexion    Wrist extension    Wrist ulnar deviation  Wrist radial deviation    Wrist pronation    Wrist supination    No active movement in LUE   HAND FUNCTION: no active movment in LUE     COGNITION: Overall cognitive status: Impaired, delayed processing, short term memory deficits   OBSERVATIONS: Pt demonstrates delayed response time to questions   TODAY'S TREATMENT:  09/01/22  Patient seen for aquatic therapy visit.  Patient entered and exited the pool via stairs and right hand rail with step to pattern and min assist.  Treatment occurred in 3.5- 4.8 ft of warm water.  Patient initally requiring BUE assist for walking in waist to chest deep water, however, after stretching and preparing left ankle, and working on isolating weight shift toward left - patient able to walk unaided x 20 feet x 5.  Emphasis on functional mobility - worked with water ball on batting, catching, tossing - improved anticipatory balance strategies this session.  Supine  with floatation equipment to address trunk mobility,  pect stretch, and address body on arm movement to decrease left trunk and left limb tension.   Used buoyancy to address range of motion to LUE to promote decreased stiffness.   PATIENT EDUCATION: Education details: role of OT, and that pt will need someone to accompany him if he goes to the pool for aquatic therapy Person educated: Patient Education method: Explanation Education comprehension: verbalized understanding   HOME EXERCISE PROGRAM: N/A    GOALS:  LONG TERM GOALS: Target date: 09/25/22 Pt will verbalize understanding of adapted strategies to maximize pt I with ADLS/IADLs(opening sandwich containers, open loaf of bread, plastic bags) Baseline: dependent Goal status: INITIAL  2.  I with updated HEP  Baseline: dependent Goal status: INITIAL  3.  Pt will complete an aquatic exercise program designed to increase LUE ROM and minimize pain Baseline: dependent Goal status: INITIAL  ASSESSMENT:  CLINICAL IMPRESSION: Patient reports significant back, leg, shoulder pain due to fall prior to session.  Pain relieved after session per patient report.       PERFORMANCE DEFICITS in functional skills including ADLs, IADLs, coordination, dexterity, proprioception, sensation, edema, tone, ROM, strength, pain, flexibility, FMC, GMC, mobility, balance, endurance, decreased knowledge of precautions, decreased knowledge of use of DME, and UE functional use, cognitive skills including energy/drive, memory, thought, and understand, and psychosocial skills including coping strategies, environmental adaptation, habits, interpersonal interactions, and routines and behaviors.   IMPAIRMENTS are limiting patient from ADLs, IADLs, work, play, leisure, and social participation.   COMORBIDITIES may have co-morbidities  that affects occupational performance. Patient will benefit from skilled OT to address above impairments and improve overall  function.  MODIFICATION OR ASSISTANCE TO COMPLETE EVALUATION: No modification of tasks or assist necessary to complete an evaluation.  OT OCCUPATIONAL PROFILE AND HISTORY: Problem focused assessment: Including review of records relating to presenting problem.  CLINICAL DECISION MAKING: LOW - limited treatment options, no task modification necessary  REHAB POTENTIAL: Good  EVALUATION COMPLEXITY: Low    PLAN: OT FREQUENCY: 1x/week start 08/07/22  OT DURATION: 8 weeks  PLANNED INTERVENTIONS: self care/ADL training, therapeutic exercise, therapeutic activity, neuromuscular re-education, manual therapy, passive range of motion, balance training, aquatic therapy, splinting, electrical stimulation, ultrasound, paraffin, fluidotherapy, moist heat, cryotherapy, contrast bath, patient/family education, cognitive remediation/compensation, energy conservation, coping strategies training, and DME and/or AE instructions  RECOMMENDED OTHER SERVICES: n/a  CONSULTED AND AGREED WITH PLAN OF CARE: Patient  PLAN FOR NEXT SESSION: aquatic therapy  Managed medicaid CPT codes: [phone]- Therapeutic Exercise, [id number]- Neuro Re-education, [phone] -  Gait Training, [id number] - Manual Therapy, [id number] - Therapeutic Activities, [phone] - [place], [phone] - Electrical stimulation (unattended), [id number] - Ultrasound, [place], [id number] - Aquatic therapy, [id number] - Fluidotherapy, [id number] -  Paraffin, [id number] - Cognitive training (First 15 min), and [id number] - Cognitive training (each additional 15 min)    Mariah Milling, OT 09/01/2022, 4:48 PM

## 2022-09-02 ENCOUNTER — Other Ambulatory Visit: Payer: Self-pay | Admitting: Adult Health

## 2022-09-08 ENCOUNTER — Encounter: Payer: Self-pay | Admitting: Occupational Therapy

## 2022-09-08 ENCOUNTER — Ambulatory Visit: Payer: Medicaid Other | Admitting: Occupational Therapy

## 2022-09-08 DIAGNOSIS — R29898 Other symptoms and signs involving the musculoskeletal system: Secondary | ICD-10-CM | POA: Diagnosis not present

## 2022-09-08 DIAGNOSIS — R482 Apraxia: Secondary | ICD-10-CM

## 2022-09-08 DIAGNOSIS — R2681 Unsteadiness on feet: Secondary | ICD-10-CM | POA: Diagnosis not present

## 2022-09-08 DIAGNOSIS — M25612 Stiffness of left shoulder, not elsewhere classified: Secondary | ICD-10-CM

## 2022-09-08 DIAGNOSIS — G8929 Other chronic pain: Secondary | ICD-10-CM | POA: Diagnosis not present

## 2022-09-08 DIAGNOSIS — I69354 Hemiplegia and hemiparesis following cerebral infarction affecting left non-dominant side: Secondary | ICD-10-CM

## 2022-09-08 DIAGNOSIS — M6281 Muscle weakness (generalized): Secondary | ICD-10-CM | POA: Diagnosis not present

## 2022-09-08 DIAGNOSIS — R293 Abnormal posture: Secondary | ICD-10-CM | POA: Diagnosis not present

## 2022-09-08 DIAGNOSIS — M25512 Pain in left shoulder: Secondary | ICD-10-CM | POA: Diagnosis not present

## 2022-09-08 DIAGNOSIS — R208 Other disturbances of skin sensation: Secondary | ICD-10-CM | POA: Diagnosis not present

## 2022-09-08 NOTE — Therapy (Signed)
OUTPATIENT OCCUPATIONAL THERAPY PROGRESS NOTE Patient Name: GRANTHAM HIPPERT MRN: [id number] DOB:May 23, 1976, 46 y.o., male Today's Date: 09/08/2022  PCP: Dr. [name] REFERRING PROVIDER: Dr. Ranell Patrick   OT End of Session - 09/08/22 1820     Visit Number 6    Number of Visits 8    Date for OT Re-Evaluation 09/25/22    Authorization Type Medicaid-Healthy Blue await auth    Authorization Time Period start date for tx  08/07/22 (Pt has used approx 8 visits for MCD this year)    OT Start Time 1500    OT Stop Time 1545    OT Time Calculation (min) 45 min    Equipment Utilized During Treatment floatation,    Activity Tolerance Patient tolerated treatment well    Behavior During Therapy [facility] for tasks assessed/performed             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   Depression    DVT (deep venous thrombosis) ([place])    BLE DVT 07/21/19, 08/02/19; s/p retrievable IVC filter 07/21/19   Hemorrhagic stroke ([place])    History of kidney stones    Hypertension    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   PE (pulmonary thromboembolism) ([place])    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   Stroke ([place])    RICA, R A1, R MCA occlusion 07/19/19   Past Surgical History:  Procedure Laterality Date   CRANIOPLASTY Right 06/05/2020   Procedure: CRANIOPLASTY;  Surgeon: Consuella Lose, MD;  Location: [place];  Service: Neurosurgery;  Laterality: Right;  right   CRANIOTOMY Right 07/19/2019   Procedure: RIGHT HEMI-CRANIECTOMY With implantation of skull flap to abdominal wall;  Surgeon: Consuella Lose, MD;  Location: [place];  Service: Neurosurgery;  Laterality: Right;   CYST EXCISION N/A 10/08/2016   Procedure: EXCISION OF POSTERIOR NECK CYST;  Surgeon: Clovis [place], MD;  [facility];  Service: General;  Laterality: N/A;   CYSTOSCOPY/URETEROSCOPY/HOLMIUM LASER/STENT PLACEMENT Right 03/16/2020   Procedure: CYSTOSCOPY RIGHT RETROGRADE PYELOGRAM  URETEROSCOPY/HOLMIUM LASER/STENT PLACEMENT;  Surgeon: Lucas Mallow, MD;  [facility];  Service: Urology;  Laterality: Right;   INCISION AND DRAINAGE ABSCESS N/A 09/22/2014   Procedure: INCISION AND DRAINAGE ABSCESS POSTERIOR NECK;  Surgeon: Pedro Earls, MD;  [facility];  Service: General;  Laterality: N/A;   INCISION AND DRAINAGE ABSCESS N/A 12/20/2015   Procedure: INCISION AND DRAINAGE POSTERIOR NECK MASS;  Surgeon: Armandina Gemma, MD;  [facility];  Service: General;  Laterality: N/A;   INCISION AND DRAINAGE ABSCESS Left 07/10/2004   middle finger   IR IVC FILTER PLMT / S&I /IMG GUID/MOD SED  07/21/2019   IR RADIOLOGIST EVAL & MGMT  12/14/2019   IR RADIOLOGIST EVAL & MGMT  12/21/2020   IR VENOGRAM RENAL UNI RIGHT  07/21/2019   MASS EXCISION N/A 07/21/2017   Procedure: EXCISION OF BENIGN NECK LESION WITH LAYERED CLOSURE;  Surgeon: Irene Limbo, MD;  Location: [place];  Service: Plastics;  Laterality: N/A;   MASS EXCISION N/A 11/10/2017   Procedure: EXCISION BENIGN LESION OF THE NECK WITH LAYERED CLOSURE;  Surgeon: Irene Limbo, MD;  Location: [place];  Service: Plastics;  Laterality: N/A;   Patient Active Problem List   Diagnosis Date Noted   COVID-19 virus infection 04/21/2021   Left-sided weakness 04/21/2021   S/P craniotomy 06/05/2020   History of cranioplasty 06/05/2020   Peri-rectal abscess  02/14/2020   Abnormal CT scan, pelvis 02/14/2020   Pancytopenia ([place]) 12/02/2019   Nephrolithiasis 12/02/2019   Hydronephrosis with renal and ureteral calculus obstruction 12/02/2019   Rectal pain 12/02/2019   Hyperkalemia 12/02/2019   Reactive depression    Wound infection after surgery    Sleep disturbance    Dysphagia, post-stroke    Transaminitis    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   Cerebral abscess    Urinary tract infection without hematuria    Altered mental status    Primary hypercoagulable  state ([place])    Acute pulmonary embolism without acute cor pulmonale (HCC)    Deep vein thrombosis (DVT) of non-extremity vein    Hypokalemia    Acute blood loss anemia    Leukocytosis    Endotracheal tube present    Acute respiratory failure with hypoxemia (HCC)    Stroke (cerebrum) ([place]) 07/19/2019   Pressure injury of skin 07/19/2019   Acute CVA (cerebrovascular accident) ([place])    Encephalopathy    Dysphagia    Acute encephalopathy    Essential hypertension    Obesity 03/12/2016   Scalp abscess 12/20/2015   Neck abscess 12/20/2015   Pilonidal cyst 02/08/2013    ONSET DATE: 06/10/22  REFERRING DIAG: K44.010,U72.5 (ICD-10-CM) - 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 (CVA)  THERAPY DIAG:  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  PMH: hx of seizures and headaches s/p CVA, hx of DVT with IVC filter placement . Pt s/p botox on 06/10/22, possible seizure in June 2023    Limitations fall risk, hx of seizures   PRECAUTIONS: Fall and Other: seizures - possible seizure in June?  WEIGHT BEARING RESTRICTIONS No  PAIN:  Are you having pain? Yes: NPRS scale: 6/10 Pain location: LUE Pain description: aching Aggravating factors: movement Relieving factors: rest    LIVING ENVIRONMENT: Lives with: lives with their spouse Lives in: House/apartment PLOF: Needs assistance with ADLs and Needs assistance with homemaking  PATIENT GOALS aquatic therapy  OBJECTIVE:   HAND  DOMINANCE: Right  ADLs: Overall ADLs: pt requires assistance at times Transfers/ambulation related to ADLs: Eating: mod I Grooming: mod I UB Dressing: mod I LB Dressing: mod A with shoes and socks Toileting: mod I with cane per pt report Bathing: min A sponge bathing for RUE    IADLs: Shopping: dependent Light housekeeping: dependent Meal Prep: needs assist  Medication management: pt's  Financial management: dependent   MOBILITY STATUS:  amb short distances with a cane, uses w/c  POSTURE COMMENTS:    ACTIVITY TOLERANCE: Activity tolerance: fatiques quickly  FUNCTIONAL OUTCOME MEASURES: Functional assessment questionnaire: 23/64 (64 indicates normal)  UPPER EXTREMITY ROM     Passive ROM Right eval Left eval  Shoulder flexion  80  Shoulder abduction  80  Shoulder adduction    Shoulder extension    Shoulder internal rotation    Shoulder external rotation    Elbow flexion  120  Elbow extension  -20  Wrist flexion    Wrist extension    Wrist ulnar deviation  Wrist radial deviation    Wrist pronation    Wrist supination    No active movement in LUE   HAND FUNCTION: no active movment in LUE     COGNITION: Overall cognitive status: Impaired, delayed processing, short term memory deficits   OBSERVATIONS: Pt demonstrates delayed response time to questions   TODAY'S TREATMENT:  09/08/22  Patient seen for aquatic therapy visit.  Patient entered and exited the pool via stairs and right hand rail with step to pattern and min assist.  Treatment occurred in 4- 4.8 ft of warm water.    Continue to work on walking in water with step through vs step to pattern.  Patient showing improved balance and confidence in standing and walking without UE support.   Supine with floatation equipment to address trunk mobility,  pect stretch, and address body on arm movement to decrease left trunk and left limb tension.   Used buoyancy to address range of motion to LUE to  promote decreased stiffness. Used dumbbell in left hand on surface of water to reduce impact of gravity and had patient attempt small active movement in LUE.  Patient has little to no sensation - so difficult to isolate volitional movement.    PATIENT EDUCATION: Education details: role of OT, and that pt will need someone to accompany him if he goes to the pool for aquatic therapy Person educated: Patient Education method: Explanation Education comprehension: verbalized understanding   HOME EXERCISE PROGRAM: N/A    GOALS:  LONG TERM GOALS: Target date: 09/25/22 Pt will verbalize understanding of adapted strategies to maximize pt I with ADLS/IADLs(opening sandwich containers, open loaf of bread, plastic bags) Baseline: dependent Goal status: INITIAL  2.  I with updated HEP  Baseline: dependent Goal status: INITIAL  3.  Pt will complete an aquatic exercise program designed to increase LUE ROM and minimize pain Baseline: dependent Goal status: INITIAL  ASSESSMENT:  CLINICAL IMPRESSION: Patient hopes to ask family member or friend to attend pool therapy to help carry over to [place] in functional skills including ADLs, IADLs, coordination, dexterity, proprioception, sensation, edema, tone, ROM, strength, pain, flexibility, FMC, GMC, mobility, balance, endurance, decreased knowledge of precautions, decreased knowledge of use of DME, and UE functional use, cognitive skills including energy/drive, memory, thought, and understand, and psychosocial skills including coping strategies, environmental adaptation, habits, interpersonal interactions, and routines and behaviors.   IMPAIRMENTS are limiting patient from ADLs, IADLs, work, play, leisure, and social participation.   COMORBIDITIES may have co-morbidities  that affects occupational performance. Patient will benefit from skilled OT to address above impairments and improve overall function.  MODIFICATION OR  ASSISTANCE TO COMPLETE EVALUATION: No modification of tasks or assist necessary to complete an evaluation.  OT OCCUPATIONAL PROFILE AND HISTORY: Problem focused assessment: Including review of records relating to presenting problem.  CLINICAL DECISION MAKING: LOW - limited treatment options, no task modification necessary  REHAB POTENTIAL: Good  EVALUATION COMPLEXITY: Low    PLAN: OT FREQUENCY: 1x/week start 08/07/22  OT DURATION: 8 weeks  PLANNED INTERVENTIONS: self care/ADL training, therapeutic exercise, therapeutic activity, neuromuscular re-education, manual therapy, passive range of motion, balance training, aquatic therapy, splinting, electrical stimulation, ultrasound, paraffin, fluidotherapy, moist heat, cryotherapy, contrast bath, patient/family education, cognitive remediation/compensation, energy conservation, coping strategies training, and DME and/or AE instructions  RECOMMENDED OTHER SERVICES: n/a  CONSULTED AND AGREED WITH PLAN OF CARE: Patient  PLAN FOR NEXT SESSION: aquatic therapy  Managed medicaid CPT codes: [phone]- Therapeutic Exercise, [id number]- Neuro  Re-education, [phone] - Gait Training, [id number] - Manual Therapy, [id number] - Therapeutic Activities, [place], [id number] - Electrical stimulation (unattended), [id number] - Ultrasound, [id number] - Orthotic Fit, [id number] - Aquatic therapy, [id number] - Fluidotherapy, [id number] -  Paraffin, [id number] - Cognitive training (First 15 min), and [id number] - Cognitive training (each additional 15 min)    Mariah Milling, OT 09/08/2022, 6:21 PM

## 2022-09-09 ENCOUNTER — Telehealth: Payer: Self-pay | Admitting: Medical

## 2022-09-09 DIAGNOSIS — I69362 Other paralytic syndrome following cerebral infarction affecting left dominant side: Secondary | ICD-10-CM | POA: Diagnosis not present

## 2022-09-09 MED ORDER — ASPIRIN 81 MG PO TBEC: 81.0000 mg | DELAYED_RELEASE_TABLET | Freq: Every day | ORAL | 0 refills | Status: DC

## 2022-09-09 NOTE — Telephone Encounter (Signed)
Pt called needing refill on his meds for ASPIRIN LOW DOSE 81 MG tablet  sent to [id number] [place] [id number]: [phone] Fax: [phone] #: [id number]. Pt stated his Neurologist mentioned that he needed to get his refill with PCP. Please advise. Pt tel [phone]

## 2022-09-09 NOTE — Telephone Encounter (Signed)
Rx sent 

## 2022-09-11 DIAGNOSIS — I69362 Other paralytic syndrome following cerebral infarction affecting left dominant side: Secondary | ICD-10-CM | POA: Diagnosis not present

## 2022-09-12 ENCOUNTER — Encounter
Payer: Medicaid Other | Attending: Physical Medicine and Rehabilitation | Admitting: Physical Medicine and Rehabilitation

## 2022-09-12 VITALS — BP 122/85 | HR 69

## 2022-09-12 DIAGNOSIS — R252 Cramp and spasm: Secondary | ICD-10-CM | POA: Insufficient documentation

## 2022-09-12 MED ORDER — SODIUM CHLORIDE (PF) 0.9 % IJ SOLN
1.0000 mL | Freq: Once | INTRAMUSCULAR | Status: AC
  Administered 2022-09-12: 1 mL via INTRAVENOUS

## 2022-09-12 MED ORDER — ONABOTULINUMTOXINA 100 UNITS IJ SOLR
100.0000 [IU] | Freq: Once | INTRAMUSCULAR | Status: AC
  Administered 2022-09-12: 100 [IU] via INTRAMUSCULAR

## 2022-09-12 NOTE — Progress Notes (Signed)
Botox Injection for spasticity and left sided UE internal rotation using ultrasound guidance  Indication: Severe spasticity which interferes with ADL,mobility and/or  hygiene and is unresponsive to medication management and other conservative care Informed consent was obtained after describing risks and benefits of the procedure with the patient. This includes bleeding, bruising, infection, excessive weakness, or medication side effects. A REMS form is on file and signed. Needle: 25cc needle Number of units per muscle Left pectoralis major: 100U in 3 sites  100 U injected into 3 sites All injections were done after obtaining appropriate ultrasound visualization (pictures taken) and after negative drawback for blood. The patient tolerated the procedure well. Post procedure instructions were given. A followup appointment was made.

## 2022-09-15 ENCOUNTER — Ambulatory Visit: Payer: Medicaid Other | Admitting: Occupational Therapy

## 2022-09-16 DIAGNOSIS — I69362 Other paralytic syndrome following cerebral infarction affecting left dominant side: Secondary | ICD-10-CM | POA: Diagnosis not present

## 2022-09-17 ENCOUNTER — Other Ambulatory Visit: Payer: Self-pay | Admitting: Physical Medicine and Rehabilitation

## 2022-09-17 DIAGNOSIS — I69362 Other paralytic syndrome following cerebral infarction affecting left dominant side: Secondary | ICD-10-CM | POA: Diagnosis not present

## 2022-09-17 DIAGNOSIS — I639 Cerebral infarction, unspecified: Secondary | ICD-10-CM

## 2022-09-18 DIAGNOSIS — I69362 Other paralytic syndrome following cerebral infarction affecting left dominant side: Secondary | ICD-10-CM | POA: Diagnosis not present

## 2022-09-19 DIAGNOSIS — I69362 Other paralytic syndrome following cerebral infarction affecting left dominant side: Secondary | ICD-10-CM | POA: Diagnosis not present

## 2022-09-23 DIAGNOSIS — I69362 Other paralytic syndrome following cerebral infarction affecting left dominant side: Secondary | ICD-10-CM | POA: Diagnosis not present

## 2022-09-24 DIAGNOSIS — I69362 Other paralytic syndrome following cerebral infarction affecting left dominant side: Secondary | ICD-10-CM | POA: Diagnosis not present

## 2022-09-25 ENCOUNTER — Other Ambulatory Visit: Payer: Self-pay | Admitting: Medical

## 2022-09-25 DIAGNOSIS — I69362 Other paralytic syndrome following cerebral infarction affecting left dominant side: Secondary | ICD-10-CM | POA: Diagnosis not present

## 2022-09-26 ENCOUNTER — Encounter
Payer: Medicaid Other | Attending: Physical Medicine and Rehabilitation | Admitting: Physical Medicine and Rehabilitation

## 2022-09-26 DIAGNOSIS — I639 Cerebral infarction, unspecified: Secondary | ICD-10-CM | POA: Insufficient documentation

## 2022-09-26 DIAGNOSIS — I69362 Other paralytic syndrome following cerebral infarction affecting left dominant side: Secondary | ICD-10-CM | POA: Diagnosis not present

## 2022-09-27 NOTE — Progress Notes (Signed)
Subjective:    Patient ID: FYNN VANBLARCOM, male    DOB: 1976/01/19, 46 y.o.   MRN: [id number]  HPI  An audio/video tele-health visit is felt to be the most appropriate encounter for this patient at this time. This is a follow up tele-visit via phone. The patient is at home. MD is at office. Prior to scheduling this appointment, our staff discussed the limitations of evaluation and management by telemedicine and the availability of in-person appointments. The patient expressed understanding and agreed to proceed.   Mr. Dunnam is a 46 year old man who presents for f/u MCA CVA, spasticity, shoulder pain, and aggression.  1) Spasticity: -Botox to pec was very helpul -he asks if he can get his Botox soon as he is not able to do PT until he gets this -has been taking Baclofen three times per day, it does make him sleepy -he is willing to try additional antispasticity medicine at night to help with his spasticity until he can get in for Botox -Botox is effective for pain relief and he needs it every 73month -His PT also noticed pain has been much better controlled -He keeps stretching out left arm with his right and this has helped to keep the arm loose.   2) Aggression: -Discussed that he has follow-up scheduled with Dr. RSima Matas   3) Shoulder pain -has developed shoulder pain this week that brings him to tears -he says it is hurting all over his shoulder joint -does not feel like the isolated pec pain he has had in the past  4) HTN -he needs refill of his metoprolol -his SBP has been 140s  5) CVA -he asks about Vivistim Washing dishes and drying them.  Kidney stones resolved following procedure.  IVC filter removed He enjoys cooking, working on stairs. He likes to cook spaghetti and eggs. He is able to walk around the the home. He is able to walk outside as well and wife says that he often walks in front of her.  He plays Scrabble with his kids and enjoys spending time with them.   Has started drinking 1 coffee every morning.   He asks about alcohol use. Has been drinking wine, rum.  Wife states that tightness in elbow and knee flexors has been better and improves the more he walks.  Has sense of humor back.  Headaches have resolved.  He has been driving with his parents next to him and has been doing a great job. No issues except when he tries to get out of the car as he needs he AD at this time. His wife asks about a platform walker for him.   Prior history: Mr. 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 His wife accompanies him and provides most of the history.   No longer has pain at surgical site. Wife asks if he should have repeat CT scan. No longer has headache.   Has been having some right sided abdominal pain. Has kidney stone. Has been having daily BM but they are hard. Also has gas.    He has completed his PT and is able to ambulate around his home.   His wife says that prior to his stroke, he liked to play basketball with their kids (ages 1104and 115, play with their dog, travel, and relax at home. He has told her that he is happy to be at home.  He has had  tightness in his right elbow and knee flexors and this has been painful to him. He has had two falls and his leg stiffness may have contributed to this.   Pain Inventory Average Pain 0 Pain Right Now 0 My pain is  no pain , left sided weakness. Here for Stroke follow up.  In the last 24 hours, has pain interfered with the following? General activity 0 Relation with others 0 Enjoyment of life 0 What TIME of day is your pain at its worst?  no pain Sleep (in general) Good  Pain is worse with:  no pain Pain improves with:  na Relief from Meds:  no pain  7 Bowel movements per week No problem with urination.      Family History  Problem Relation Age of Onset   Healthy Mother    Healthy Father    Clotting disorder Maternal  Grandmother    Diabetes Paternal Grandfather    Clotting disorder Maternal Aunt    Clotting disorder Maternal Uncle    Colon cancer Neg Hx    Social History   Socioeconomic History   Marital status: Married    Spouse name: Melene Muller   Number of children: 2   Years of education: Not on file   Highest education level: Not on file  Occupational History   Occupation: unemployed 01/30/21  Tobacco Use   Smoking status: Former    Packs/day: 0.00    Types: Cigarettes    Quit date: 11/24/2015    Years since quitting: 6.8   Smokeless tobacco: Never   Tobacco comments:       Vaping Use   Vaping Use: Never used  Substance and Sexual Activity   Alcohol use: Yes    Comment: socially   Drug use: Never   Sexual activity: Not Currently  Other Topics Concern   Not on file  Social History Narrative   Lives with wife and kids   Right Handed   Drinks >10 cups caffeine daily   Social Determinants of Health   Financial Resource Strain: Not on file  Food Insecurity: Not on file  Transportation Needs: Not on file  Physical Activity: Not on file  Stress: Not on file  Social Connections: Not on file   Past Surgical History:  Procedure Laterality Date   CRANIOPLASTY Right 06/05/2020   Procedure: CRANIOPLASTY;  Surgeon: Consuella Lose, MD;  Location: [facility] [facility];  Service: Neurosurgery;  Laterality: Right;  right   CRANIOTOMY Right 07/19/2019   Procedure: RIGHT HEMI-CRANIECTOMY With implantation of skull flap to abdominal wall;  Surgeon: Consuella Lose, MD;  Location: [place];  Service: Neurosurgery;  Laterality: Right;   CYST EXCISION N/A 10/08/2016   Procedure: EXCISION OF POSTERIOR NECK CYST;  Surgeon: Clovis Riley, MD;  [facility];  Service: General;  Laterality: N/A;   CYSTOSCOPY/URETEROSCOPY/HOLMIUM LASER/STENT PLACEMENT Right 03/16/2020   Procedure: CYSTOSCOPY RIGHT RETROGRADE PYELOGRAM URETEROSCOPY/HOLMIUM LASER/STENT PLACEMENT;  Surgeon: Lucas Mallow, MD;  [facility];  Service: Urology;  Laterality: Right;   INCISION AND DRAINAGE ABSCESS N/A 09/22/2014   Procedure: INCISION AND DRAINAGE ABSCESS POSTERIOR NECK;  Surgeon: Pedro Earls, MD;  [facility];  Service: General;  Laterality: N/A;   INCISION AND DRAINAGE ABSCESS N/A 12/20/2015   Procedure: INCISION AND DRAINAGE POSTERIOR NECK MASS;  Surgeon: Armandina Gemma, MD;  [facility];  Service: General;  Laterality: N/A;   INCISION AND DRAINAGE ABSCESS Left 07/10/2004   middle finger   IR IVC FILTER  PLMT / S&I /IMG GUID/MOD SED  07/21/2019   IR RADIOLOGIST EVAL & MGMT  12/14/2019   IR RADIOLOGIST EVAL & MGMT  12/21/2020   IR VENOGRAM RENAL UNI RIGHT  07/21/2019   MASS EXCISION N/A 07/21/2017   Procedure: EXCISION OF BENIGN NECK LESION WITH LAYERED CLOSURE;  Surgeon: Irene Limbo, MD;  Location: [place];  Service: Plastics;  Laterality: N/A;   MASS EXCISION N/A 11/10/2017   Procedure: EXCISION BENIGN LESION OF THE NECK WITH LAYERED CLOSURE;  Surgeon: Irene Limbo, MD;  Location: [place];  Service: Plastics;  Laterality: N/A;   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  Opioid Risk Score:   Fall Risk Score:  `1  Depression screen [facility] 2/9     06/10/2022   11:24 AM 01/21/2022   11:24 AM 09/24/2021   11:15 AM 07/19/2021    1:58 PM 06/18/2021   11:36 AM 03/21/2021   12:11 PM 01/16/2021    3:19 PM  Depression screen PHQ 2/9  Decreased Interest 0 0 0 0 0 0 0  Down, Depressed, Hopeless 0 0 0 0 0 0  0  PHQ - 2 Score 0 0 0 0 0 0 0  Altered sleeping    0     Tired, decreased energy    0     Change in appetite    0     Feeling bad or failure about yourself     0     Trouble concentrating    0     Moving slowly or fidgety/restless    0     Suicidal thoughts    0     PHQ-9 Score    0     Difficult doing work/chores    Not difficult at all        Review of Systems     Objective:   Physical Exam Not performed as patient was seen via phone     Assessment & Plan:  Mr. Auld is a a 46 year old man who presents for f/u following his right middle cerebral artery stroke.   1) Headache -Continues to be resolved. Advised that he does not required repeat CT Head unless he develops symptoms and signs of infection or bleed--discussed which to look out for.   2) Abdominal pain: -Resolved following surgical removal of kidney stones  3) Weakness/spasticity: -Left sided tone much improved after Botox. Will target left pec major again. Will repeat every 3 months. Arm is currently without signficant tone -Asked staff to prioritize him as #1 on waitlist for Botox -prescribed tizanidine to help his spasticity until he is able to get in -Encouraged him to keep stretching left arm.  -Discussed transcranial magnetic stimulation as a an option to improve muscle strength. Also discussed [employer] PNS as an option. Provided links and phones numbers for both options for wife to investigate further.  -Made goal to walk  outside 15 minutes per day, start resistance training with low weights.  -Provided script for platform walker -Provided script for occupational therapy.   4) Aggression: -provided referral to see Dr. Sima Matas  5) shoulder pain -prescribed steroid dose pack -if does not help, can consider steroid injection into the joint.   6) MCA stroke: Return to driving: -Once parents clear him after driving next to him > times he may drive on his own.  -discussed Vivistim, discussed that I  don't believe he will qualify for current trials given his current level of weakness  7) HTN -refilled metoprolol  Cranial incision healing well.   Quality of life: Discussed spending time with his kids, playing games to keep his mind sharp.    All questions were encouraged and answered. Follow up with me in 6 weeks.   5 minutes spent in discussing Vivistim, discussed that I do not think he will qualify for current trial

## 2022-09-29 DIAGNOSIS — I69362 Other paralytic syndrome following cerebral infarction affecting left dominant side: Secondary | ICD-10-CM | POA: Diagnosis not present

## 2022-09-30 DIAGNOSIS — I69362 Other paralytic syndrome following cerebral infarction affecting left dominant side: Secondary | ICD-10-CM | POA: Diagnosis not present

## 2022-10-01 DIAGNOSIS — I69362 Other paralytic syndrome following cerebral infarction affecting left dominant side: Secondary | ICD-10-CM | POA: Diagnosis not present

## 2022-10-02 DIAGNOSIS — I69362 Other paralytic syndrome following cerebral infarction affecting left dominant side: Secondary | ICD-10-CM | POA: Diagnosis not present

## 2022-10-07 DIAGNOSIS — I69362 Other paralytic syndrome following cerebral infarction affecting left dominant side: Secondary | ICD-10-CM | POA: Diagnosis not present

## 2022-10-08 DIAGNOSIS — I69362 Other paralytic syndrome following cerebral infarction affecting left dominant side: Secondary | ICD-10-CM | POA: Diagnosis not present

## 2022-10-09 DIAGNOSIS — I69362 Other paralytic syndrome following cerebral infarction affecting left dominant side: Secondary | ICD-10-CM | POA: Diagnosis not present

## 2022-10-10 DIAGNOSIS — I69362 Other paralytic syndrome following cerebral infarction affecting left dominant side: Secondary | ICD-10-CM | POA: Diagnosis not present

## 2022-10-13 DIAGNOSIS — I69362 Other paralytic syndrome following cerebral infarction affecting left dominant side: Secondary | ICD-10-CM | POA: Diagnosis not present

## 2022-10-15 DIAGNOSIS — I69362 Other paralytic syndrome following cerebral infarction affecting left dominant side: Secondary | ICD-10-CM | POA: Diagnosis not present

## 2022-10-17 DIAGNOSIS — I69362 Other paralytic syndrome following cerebral infarction affecting left dominant side: Secondary | ICD-10-CM | POA: Diagnosis not present

## 2022-10-20 ENCOUNTER — Other Ambulatory Visit: Payer: Self-pay | Admitting: Physical Medicine and Rehabilitation

## 2022-10-20 ENCOUNTER — Other Ambulatory Visit: Payer: Self-pay

## 2022-10-20 DIAGNOSIS — R252 Cramp and spasm: Secondary | ICD-10-CM

## 2022-10-20 DIAGNOSIS — I10 Essential (primary) hypertension: Secondary | ICD-10-CM

## 2022-10-20 MED ORDER — METOPROLOL TARTRATE 50 MG PO TABS: 50.0000 mg | ORAL_TABLET | Freq: Two times a day (BID) | ORAL | 3 refills | Status: DC

## 2022-10-20 MED ORDER — ATORVASTATIN CALCIUM 10 MG PO TABS: 10.0000 mg | ORAL_TABLET | Freq: Every day | ORAL | 1 refills | Status: DC

## 2022-10-22 ENCOUNTER — Other Ambulatory Visit: Payer: Self-pay

## 2022-10-22 DIAGNOSIS — I10 Essential (primary) hypertension: Secondary | ICD-10-CM

## 2022-10-22 MED ORDER — METOPROLOL TARTRATE 50 MG PO TABS: 50.0000 mg | ORAL_TABLET | Freq: Two times a day (BID) | ORAL | 3 refills | Status: DC

## 2022-11-08 ENCOUNTER — Other Ambulatory Visit: Payer: Self-pay | Admitting: Medical

## 2022-11-11 ENCOUNTER — Telehealth: Payer: Self-pay

## 2022-11-11 NOTE — Telephone Encounter (Signed)
Matthew Black left a voice message for a call back about some paperwork. Call was returned to find out more details of his request. No answer.

## 2022-11-11 NOTE — Telephone Encounter (Signed)
I spoke with Matthew Black. He has a packet for his lift that Dr. Ranell Patrick will need to review and fill out. Patient has been advised to fill out his portion. Then drop off the paperwork for Dr. Ranell Patrick to review.  Call back phone [phone]

## 2022-11-19 ENCOUNTER — Ambulatory Visit: Payer: Medicare Other | Admitting: Medical

## 2022-11-20 ENCOUNTER — Telehealth: Payer: Self-pay

## 2022-11-20 NOTE — Telephone Encounter (Signed)
Patient called and left name, dob, and phone number.  Returned patient call no answer left message.

## 2022-11-25 ENCOUNTER — Encounter
Payer: Medicare Other | Attending: Physical Medicine and Rehabilitation | Admitting: Physical Medicine and Rehabilitation

## 2022-11-25 DIAGNOSIS — M62838 Other muscle spasm: Secondary | ICD-10-CM | POA: Insufficient documentation

## 2022-11-25 DIAGNOSIS — Z789 Other specified health status: Secondary | ICD-10-CM | POA: Diagnosis not present

## 2022-11-25 DIAGNOSIS — I639 Cerebral infarction, unspecified: Secondary | ICD-10-CM | POA: Insufficient documentation

## 2022-11-25 DIAGNOSIS — Z7409 Other reduced mobility: Secondary | ICD-10-CM | POA: Insufficient documentation

## 2022-11-25 DIAGNOSIS — R252 Cramp and spasm: Secondary | ICD-10-CM | POA: Insufficient documentation

## 2022-11-25 DIAGNOSIS — I69352 Hemiplegia and hemiparesis following cerebral infarction affecting left dominant side: Secondary | ICD-10-CM | POA: Insufficient documentation

## 2022-11-25 DIAGNOSIS — I10 Essential (primary) hypertension: Secondary | ICD-10-CM | POA: Insufficient documentation

## 2022-11-25 NOTE — Progress Notes (Signed)
Subjective:    Patient ID: Matthew Black, male    DOB: [birth year], 47 y.o.   MRN: [id number]  HPI  An audio/video tele-health visit is felt to be the most appropriate encounter for this patient at this time. This is a follow up tele-visit via phone. The patient is at home. MD is at office. Prior to scheduling this appointment, our staff discussed the limitations of evaluation and management by telemedicine and the availability of in-person appointments. The patient expressed understanding and agreed to proceed.   Matthew Black is a 47 year old man who presents for f/u MCA CVA, spasticity, shoulder pain, and aggression.  1) Spasticity: -Botox to pec was very helpul -he asks if he can get his Botox soon as he is not able to do PT until he gets this -has been taking Baclofen three times per day, it does make him sleepy -he is willing to try additional antispasticity medicine at night to help with his spasticity until he can get in for Botox -Botox is effective for pain relief and he needs it every 63month -His PT also noticed pain has been much better controlled -He keeps stretching out left arm with his right and this has helped to keep the arm loose.   2) Aggression: -Discussed that he has follow-up scheduled with Dr. RSima Matas   3) Shoulder pain -has developed shoulder pain this week that brings him to tears -he says it is hurting all over his shoulder joint -does not feel like the isolated pec pain he has had in the past  4) HTN -he needs refill of his metoprolol -his SBP has been 140s  5) CVA -he asks about Vivistim Washing dishes and drying them.  Kidney stones resolved following procedure.  IVC filter removed He enjoys cooking, working on stairs. He likes to cook spaghetti and eggs. He is able to walk around the the home. He is able to walk outside as well and wife says that he often walks in front of her.  He plays Scrabble with his kids and enjoys spending time with them.   Has started drinking 1 coffee every morning.   He asks about alcohol use. Has been drinking wine, rum.  Wife states that tightness in elbow and knee flexors has been better and improves the more he walks.  Has sense of humor back.  Headaches have resolved.  He has been driving with his parents next to him and has been doing a great job. No issues except when he tries to get out of the car as he needs he AD at this time. His wife asks about a platform walker for him.   6) Impaired mobility and ADLs: - Mr. 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 His wife accompanies him and provides most of the history.   No longer has pain at surgical site. Wife asks if he should have repeat CT scan. No longer has headache.   Has been having some right sided abdominal pain. Has kidney stone. Has been having daily BM but they are hard. Also has gas.    He has completed his PT and is able to ambulate around his home.   His wife says that prior to his stroke, he liked to play basketball with their kids (  ages 69 and 5), play with their dog, travel, and relax at home. He has told her that he is happy to be at home.  He has had tightness in his right elbow and knee flexors and this has been painful to him. He has had two falls and his leg stiffness may have contributed to this.   Pain Inventory Average Pain 0 Pain Right Now 0 My pain is  no pain , left sided weakness. Here for Stroke follow up.  In the last 24 hours, has pain interfered with the following? General activity 0 Relation with others 0 Enjoyment of life 0 What TIME of day is your pain at its worst?  no pain Sleep (in general) Good  Pain is worse with:  no pain Pain improves with:  na Relief from Meds:  no pain  7 Bowel movements per week No problem with  urination.      Family History  Problem Relation Age of Onset   Healthy Mother    Healthy Father    Clotting disorder Maternal Grandmother    Diabetes Paternal Grandfather    Clotting disorder Maternal Aunt    Clotting disorder Maternal Uncle    Colon cancer Neg Hx    Social History   Socioeconomic History   Marital status: Married    Spouse name: Melene Muller   Number of children: 2   Years of education: Not on file   Highest education level: Not on file  Occupational History   Occupation: unemployed 01/30/21  Tobacco Use   Smoking status: Former    Packs/day: 0.00    Types: Cigarettes    Quit date: 11/24/2015    Years since quitting: 7.0   Smokeless tobacco: Never   Tobacco comments:       Vaping Use   Vaping Use: Never used  Substance and Sexual Activity   Alcohol use: Yes    Comment: socially   Drug use: Never   Sexual activity: Not Currently  Other Topics Concern   Not on file  Social History Narrative   Lives with wife and kids   Right Handed   Drinks >10 cups caffeine daily   Social Determinants of Health   Financial Resource Strain: Not on file  Food Insecurity: Not on file  Transportation Needs: Not on file  Physical Activity: Not on file  Stress: Not on file  Social Connections: Not on file   Past Surgical History:  Procedure Laterality Date   CRANIOPLASTY Right 06/05/2020   Procedure: CRANIOPLASTY;  Surgeon: Consuella Lose, MD;  Location: [place];  Service: Neurosurgery;  Laterality: Right;  right   CRANIOTOMY Right 07/19/2019   Procedure: RIGHT HEMI-CRANIECTOMY With implantation of skull flap to abdominal wall;  Surgeon: Consuella Lose, MD;  Location: [place];  Service: Neurosurgery;  Laterality: Right;   CYST EXCISION N/A 10/08/2016   Procedure: EXCISION OF POSTERIOR NECK CYST;  Surgeon: Clovis Riley, MD;  [facility];  Service: General;  Laterality: N/A;   CYSTOSCOPY/URETEROSCOPY/HOLMIUM LASER/STENT PLACEMENT Right 03/16/2020    Procedure: CYSTOSCOPY RIGHT RETROGRADE PYELOGRAM URETEROSCOPY/HOLMIUM LASER/STENT PLACEMENT;  Surgeon: Lucas Mallow, MD;  [facility];  Service: Urology;  Laterality: Right;   INCISION AND DRAINAGE ABSCESS N/A 09/22/2014   Procedure: INCISION AND DRAINAGE ABSCESS POSTERIOR NECK;  Surgeon: Pedro Earls, MD;  [facility];  Service: General;  Laterality: N/A;   INCISION AND DRAINAGE ABSCESS N/A 12/20/2015   Procedure: INCISION AND DRAINAGE POSTERIOR NECK MASS;  Surgeon: Sherren Mocha  Gerkin, MD;  [facility];  Service: General;  Laterality: N/A;   INCISION AND DRAINAGE ABSCESS Left 07/10/2004   middle finger   IR IVC FILTER PLMT / S&I /IMG GUID/MOD SED  07/21/2019   IR RADIOLOGIST EVAL & MGMT  12/14/2019   IR RADIOLOGIST EVAL & MGMT  12/21/2020   IR VENOGRAM RENAL UNI RIGHT  07/21/2019   MASS EXCISION N/A 07/21/2017   Procedure: EXCISION OF BENIGN NECK LESION WITH LAYERED CLOSURE;  Surgeon: Irene Limbo, MD;  Location: [place];  Service: Plastics;  Laterality: N/A;   MASS EXCISION N/A 11/10/2017   Procedure: EXCISION BENIGN LESION OF THE NECK WITH LAYERED CLOSURE;  Surgeon: Irene Limbo, MD;  Location: [place];  Service: Plastics;  Laterality: N/A;   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  Opioid Risk Score:   Fall Risk Score:  `1  Depression screen [facility] 2/9     06/10/2022   11:24 AM 01/21/2022   11:24 AM 09/24/2021   11:15 AM 07/19/2021    1:58 PM 06/18/2021   11:36 AM  03/21/2021   12:11 PM 01/16/2021    3:19 PM  Depression screen PHQ 2/9  Decreased Interest 0 0 0 0 0 0 0  Down, Depressed, Hopeless 0 0 0 0 0 0 0  PHQ - 2 Score 0 0 0 0 0 0 0  Altered sleeping    0     Tired, decreased energy    0     Change in appetite    0     Feeling bad or failure about yourself     0     Trouble concentrating    0     Moving slowly or fidgety/restless    0     Suicidal thoughts    0     PHQ-9 Score    0     Difficult doing work/chores    Not difficult at all        Review of Systems     Objective:   Physical Exam Not performed as patient was seen via phone     Assessment & Plan:  Mr. Fread is a a 47 year old man who presents for f/u following his right middle cerebral artery stroke.   1) Headache -Continues to be resolved. Advised that he does not required repeat CT Head unless he develops symptoms and signs of infection or bleed--discussed which to look out for.   2) Abdominal pain: -Resolved following surgical removal of kidney stones  3) Weakness/spasticity: -Left sided tone much improved after Botox. Will target left pec major again. Will repeat every 3 months. Arm is currently without signficant tone -Asked staff to prioritize him as #1 on waitlist for Botox -prescribed tizanidine to help his spasticity until he is able to get in -Encouraged him to keep stretching left arm.  -Discussed transcranial magnetic stimulation as a an option  to improve muscle strength. Also discussed [employer] PNS as an option. Provided links and phones numbers for both options for wife to investigate further.  -Made goal to walk outside 15 minutes per day, start resistance training with low weights.  -Provided script for platform walker -Provided script for occupational therapy.   4) Aggression: -provided referral to see Dr. Sima Matas  5) shoulder pain -prescribed steroid dose pack -if does not help, can consider steroid injection into the joint.   6) MCA  stroke: Return to driving: -Once parents clear him after driving next to him > times he may drive on his own.  -discussed Vivistim, discussed that I don't believe he will qualify for current trials given his current level of weakness  7) HTN -refilled metoprolol  8) Impaired mobility and ADLs -discussed CAP forms and his need for a lift at home so he can ascend the stairs -will complete these forms today and fax for him.   Cranial incision healing well.   Quality of life: Discussed spending time with his kids, playing games to keep his mind sharp.    All questions were encouraged and answered. Follow up with me in 6 weeks.   5 minutes spent in discussion of his CAP forms and his need for a lift at home so he can ascend the stairs, that I would complete these forms for him today.

## 2022-12-01 ENCOUNTER — Ambulatory Visit: Payer: Medicare Other | Attending: Medical | Admitting: Occupational Therapy

## 2022-12-01 DIAGNOSIS — M25512 Pain in left shoulder: Secondary | ICD-10-CM | POA: Insufficient documentation

## 2022-12-01 DIAGNOSIS — M25612 Stiffness of left shoulder, not elsewhere classified: Secondary | ICD-10-CM | POA: Diagnosis present

## 2022-12-01 DIAGNOSIS — G8929 Other chronic pain: Secondary | ICD-10-CM | POA: Insufficient documentation

## 2022-12-01 DIAGNOSIS — R29898 Other symptoms and signs involving the musculoskeletal system: Secondary | ICD-10-CM | POA: Diagnosis present

## 2022-12-01 DIAGNOSIS — I69354 Hemiplegia and hemiparesis following cerebral infarction affecting left non-dominant side: Secondary | ICD-10-CM | POA: Diagnosis present

## 2022-12-01 DIAGNOSIS — M6281 Muscle weakness (generalized): Secondary | ICD-10-CM | POA: Diagnosis present

## 2022-12-01 DIAGNOSIS — R208 Other disturbances of skin sensation: Secondary | ICD-10-CM | POA: Diagnosis present

## 2022-12-01 DIAGNOSIS — R482 Apraxia: Secondary | ICD-10-CM | POA: Diagnosis present

## 2022-12-01 DIAGNOSIS — R293 Abnormal posture: Secondary | ICD-10-CM | POA: Diagnosis present

## 2022-12-01 DIAGNOSIS — R2681 Unsteadiness on feet: Secondary | ICD-10-CM

## 2022-12-01 DIAGNOSIS — R252 Cramp and spasm: Secondary | ICD-10-CM | POA: Diagnosis not present

## 2022-12-01 NOTE — Therapy (Signed)
OUTPATIENT OCCUPATIONAL THERAPY NEURO EVALUATION  Patient Name: Matthew Black MRN: [id number] DOB:1976-06-15, 47 y.o., male Today's Date: 12/01/2022  PCP: Mackie Pai, PA-C REFERRING PROVIDER: Leeroy Cha  END OF SESSION:  OT End of Session - 12/01/22 1712     Visit Number 1    Number of Visits 9    Date for OT Re-Evaluation 02/14/23    Authorization Type Medicare A&B/Medicaid    Progress Note Due on Visit 10    OT Start Time 1415    OT Stop Time 1500    OT Time Calculation (min) 45 min    Equipment Utilized During Treatment floatation,    Activity Tolerance Patient tolerated treatment well             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   Depression    DVT (deep venous thrombosis) ([place])    BLE DVT 07/21/19, 08/02/19; s/p retrievable IVC filter 07/21/19   Hemorrhagic stroke ([place])    History of kidney stones    Hypertension    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   PE (pulmonary thromboembolism) ([place])    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   Stroke ([place])    RICA, R A1, R MCA occlusion 07/19/19   Past Surgical History:  Procedure Laterality Date   CRANIOPLASTY Right 06/05/2020   Procedure: CRANIOPLASTY;  Surgeon: Consuella Lose, MD;  Location: [place];  Service: Neurosurgery;  Laterality: Right;  right   CRANIOTOMY Right 07/19/2019   Procedure: RIGHT HEMI-CRANIECTOMY With implantation of skull flap to abdominal wall;  Surgeon: Consuella Lose, MD;  Location: [facility];  Service: Neurosurgery;  Laterality: Right;   CYST EXCISION N/A 10/08/2016   Procedure: EXCISION OF POSTERIOR NECK CYST;  Surgeon: Clovis Riley, MD;  [facility];  Service: General;  Laterality: N/A;   CYSTOSCOPY/URETEROSCOPY/HOLMIUM LASER/STENT PLACEMENT Right 03/16/2020   Procedure: CYSTOSCOPY RIGHT RETROGRADE PYELOGRAM URETEROSCOPY/HOLMIUM LASER/STENT PLACEMENT;  Surgeon: Lucas Mallow, MD;  [facility];  Service:  Urology;  Laterality: Right;   INCISION AND DRAINAGE ABSCESS N/A 09/22/2014   Procedure: INCISION AND DRAINAGE ABSCESS POSTERIOR NECK;  Surgeon: Pedro Earls, MD;  [facility];  Service: General;  Laterality: N/A;   INCISION AND DRAINAGE ABSCESS N/A 12/20/2015   Procedure: INCISION AND DRAINAGE POSTERIOR NECK MASS;  Surgeon: Armandina Gemma, MD;  [facility];  Service: General;  Laterality: N/A;   INCISION AND DRAINAGE ABSCESS Left 07/10/2004   middle finger   IR IVC FILTER PLMT / S&I /IMG GUID/MOD SED  07/21/2019   IR RADIOLOGIST EVAL & MGMT  12/14/2019   IR RADIOLOGIST EVAL & MGMT  12/21/2020   IR VENOGRAM RENAL UNI RIGHT  07/21/2019   MASS EXCISION N/A 07/21/2017   Procedure: EXCISION OF BENIGN NECK LESION WITH LAYERED CLOSURE;  Surgeon: Irene Limbo, MD;  Location: [place];  Service: Plastics;  Laterality: N/A;   MASS EXCISION N/A 11/10/2017   Procedure: EXCISION BENIGN LESION OF THE NECK WITH LAYERED CLOSURE;  Surgeon: Irene Limbo, MD;  Location: [facility];  Service: Plastics;  Laterality: N/A;   Patient Active Problem List   Diagnosis Date Noted   COVID-19 virus infection 04/21/2021   Left-sided weakness 04/21/2021   S/P craniotomy 06/05/2020   History of cranioplasty 06/05/2020   Peri-rectal abscess 02/14/2020   Abnormal CT scan, pelvis 02/14/2020   Pancytopenia ([place]) 12/02/2019   Nephrolithiasis 12/02/2019   Hydronephrosis  with renal and ureteral calculus obstruction 12/02/2019   Rectal pain 12/02/2019   Hyperkalemia 12/02/2019   Reactive depression    Wound infection after surgery    Sleep disturbance    Dysphagia, post-stroke    Transaminitis    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   Cerebral abscess    Urinary tract infection without hematuria    Altered mental status    Primary hypercoagulable state ([place])    Acute pulmonary embolism without acute cor pulmonale (HCC)    Deep vein thrombosis (DVT) of  non-extremity vein    Hypokalemia    Acute blood loss anemia    Leukocytosis    Endotracheal tube present    Acute respiratory failure with hypoxemia (H[facility])    Stroke (cerebrum) ([place]) 07/19/2019   Pressure injury of skin 07/19/2019   Acute CVA (cerebrovascular accident) ([place])    Encephalopathy    Dysphagia    Acute encephalopathy    Essential hypertension    Obesity 03/12/2016   Scalp abscess 12/20/2015   Neck abscess 12/20/2015   Pilonidal cyst 02/08/2013    ONSET DATE: 10/20/22 referral date  REFERRING DIAG: Spastic hemilegia  THERAPY DIAG:  Spastic hemiplegia of left nondominant side as late effect of cerebral infarction ([place])  Other disturbances of skin sensation  Unsteadiness on feet  Muscle weakness (generalized)  Chronic left shoulder pain  Abnormal posture  Stiffness of left shoulder, not elsewhere classified  Apraxia  Other symptoms and signs involving the musculoskeletal system  Rationale for Evaluation and Treatment: Rehabilitation  SUBJECTIVE:   SUBJECTIVE STATEMENT: I could walk to the bathroom without my brace after the pool.   Pt accompanied by: family member  PERTINENT HISTORY: hemorrhagic stroke  PRECAUTIONS: Fall  WEIGHT BEARING RESTRICTIONS: No  PAIN:  Are you having pain? Yes: NPRS scale: 9/10 Pain location: left arm Pain description: aching, sharp Aggravating factors: movement Relieving factors: Tylenol  FALLS: Has patient fallen in last 6 months? yes  LIVING ENVIRONMENT: Lives with: lives with their family and lives with their spouse Lives in: House/apartment Stairs: yes Has following equipment at home: Wheelchair (manual) and hospital bed  PLOF: Independent with basic ADLs  PATIENT GOALS: reduce pain, improve mobility  OBJECTIVE:   HAND DOMINANCE: Right  ADLs: Overall ADLs: mod assist Transfers/ambulation related to ADLs: walks with quad cane Eating: Mod I Grooming: Mod I UB Dressing: Mod assist LB Dressing:  max assist Toileting: min assist Bathing: Mod assist Tub Shower transfers: Shower is upstairs - sponge bathes  IADLs: Shopping: Min assist Light housekeeping: Min assist Meal Prep: Min assist Community mobility: Min assist   MOBILITY STATUS: difficulty carrying objections with ambulation  POSTURE COMMENTS:  rounded shoulders, forward head, posterior pelvic tilt, and flexed trunk  Sitting balance: Supports self with one extremity    UPPER EXTREMITY ROM:    Passive ROM Right eval Left eval  Shoulder flexion WFL 40  Shoulder abduction  40  Shoulder adduction    Shoulder extension    Shoulder internal rotation    Shoulder external rotation    Elbow flexion    Elbow extension  -25  Wrist flexion    Wrist extension    Wrist ulnar deviation    Wrist radial deviation    Wrist pronation    Wrist supination    (Blank rows = not tested)  UPPER EXTREMITY MMT:     MMT Right eval Left eval  Shoulder flexion [facility] NT  Shoulder abduction    Shoulder adduction  Shoulder extension    Shoulder internal rotation    Shoulder external rotation    Middle trapezius    Lower trapezius    Elbow flexion    Elbow extension    Wrist flexion    Wrist extension    Wrist ulnar deviation    Wrist radial deviation    Wrist pronation    Wrist supination    (Blank rows = not tested)   COORDINATION: No formal assessment completed.  No volitional movement In LUE  SENSATION: Light touch: Impaired    MUSCLE TONE: LUE: Moderate  COGNITION: Overall cognitive status: Impaired   PERCEPTION: Impaired: Inattention/neglect: does not attend to left side of body  PRAXIS: Impaired: Ideomotor and Motor planning    TODAY'S TREATMENT:                                                                                                                              DATE: 12/01/22  Completed OT Evaluation and initiated aquatic training program.  Patient reporting pain 9/10 in left arm at  start of session.  Worked on walking without UE support with emphasis on step length, width, and balance.  Worked to address range of motion in LUE.  Patient with limited range and increase in pain with attempts to walk with arm stabilized.  Discussed with patient need to establish a family member or friend to attend subsequent session to determine exercise program.    PATIENT EDUCATION: Education details: Aquatics program Person educated: Patient Education method: Explanation Education comprehension: verbalized understanding  HOME EXERCISE PROGRAM: TBD   GOALS: Goals reviewed with patient? Yes  SHORT TERM GOALS: Target date: 01/01/23  Patient will report decrease in pain in left shoulder by at least 3 points (0-10 scale) following aquatics exercise program  Goal status: INITIAL  2.  Patient will enter and exit the pool with one hand rail and supervision Baseline:  Goal status: INITIAL   LONG TERM GOALS: Target date: 01/30/23  Patient will complete an aquatic exercise program to address LUE/LLE range of motion Baseline:  Goal status: INITIAL  2.  Patient will complete an aquatic exercise program designed to address dynamic standing balance Baseline:  Goal status: INITIAL     ASSESSMENT:  CLINICAL IMPRESSION: Patient is a 47 y.o. male who was seen today for occupational therapy evaluation for left spastic hemiplegia.   PERFORMANCE DEFICITS: in functional skills including ADLs, coordination, proprioception, sensation, tone, ROM, strength, pain, flexibility, Fine motor control, Gross motor control, mobility, balance, body mechanics, endurance, decreased knowledge of use of DME, and UE functional use, cognitive skills including attention, emotional, memory, problem solving, and safety awareness, and psychosocial skills including environmental adaptation.   IMPAIRMENTS: are limiting patient from ADLs.   CO-MORBIDITIES: may have co-morbidities  that affects occupational  performance. Patient will benefit from skilled OT to address above impairments and improve overall function.  MODIFICATION OR ASSISTANCE TO COMPLETE EVALUATION: Min-Moderate modification of tasks or assist  with assess necessary to complete an evaluation.  OT OCCUPATIONAL PROFILE AND HISTORY: Detailed assessment: Review of records and additional review of physical, cognitive, psychosocial history related to current functional performance.  CLINICAL DECISION MAKING: Moderate - several treatment options, min-mod task modification necessary  REHAB POTENTIAL: Good  EVALUATION COMPLEXITY: Moderate    PLAN:  OT FREQUENCY: 1x/week  OT DURATION: 8 weeks  PLANNED INTERVENTIONS: self care/ADL training, therapeutic exercise, therapeutic activity, neuromuscular re-education, balance training, functional mobility training, aquatic therapy, patient/family education, visual/perceptual remediation/compensation, and DME and/or AE instructions  RECOMMENDED OTHER SERVICES: NA  CONSULTED AND AGREED WITH PLAN OF CARE: Patient  PLAN FOR NEXT SESSION: Dynamic standing balance, LUE range of motion, sitting balance   Mariah Milling, OT 12/01/2022, 5:15 PM

## 2022-12-01 NOTE — Addendum Note (Signed)
Addended by: Mariah Milling on: 12/01/2022 05:53 PM   Modules accepted: Orders

## 2022-12-02 ENCOUNTER — Encounter: Payer: Medicaid Other | Admitting: Occupational Therapy

## 2022-12-08 ENCOUNTER — Ambulatory Visit: Payer: Medicare Other | Admitting: Occupational Therapy

## 2022-12-15 ENCOUNTER — Encounter: Payer: Self-pay | Admitting: Physical Medicine and Rehabilitation

## 2022-12-15 ENCOUNTER — Ambulatory Visit: Payer: Medicare Other | Admitting: Occupational Therapy

## 2022-12-15 ENCOUNTER — Encounter (HOSPITAL_BASED_OUTPATIENT_CLINIC_OR_DEPARTMENT_OTHER): Payer: Medicare Other | Admitting: Physical Medicine and Rehabilitation

## 2022-12-15 VITALS — BP 115/79 | HR 74 | Temp 97.7°F | Ht 72.0 in

## 2022-12-15 DIAGNOSIS — M62838 Other muscle spasm: Secondary | ICD-10-CM | POA: Diagnosis not present

## 2022-12-15 DIAGNOSIS — I639 Cerebral infarction, unspecified: Secondary | ICD-10-CM | POA: Diagnosis present

## 2022-12-15 DIAGNOSIS — R252 Cramp and spasm: Secondary | ICD-10-CM | POA: Diagnosis not present

## 2022-12-15 DIAGNOSIS — I10 Essential (primary) hypertension: Secondary | ICD-10-CM | POA: Diagnosis not present

## 2022-12-15 DIAGNOSIS — I69352 Hemiplegia and hemiparesis following cerebral infarction affecting left dominant side: Secondary | ICD-10-CM

## 2022-12-15 DIAGNOSIS — Z789 Other specified health status: Secondary | ICD-10-CM | POA: Diagnosis present

## 2022-12-15 DIAGNOSIS — Z7409 Other reduced mobility: Secondary | ICD-10-CM | POA: Diagnosis present

## 2022-12-15 MED ORDER — ONABOTULINUMTOXINA 100 UNITS IJ SOLR
100.0000 [IU] | Freq: Once | INTRAMUSCULAR | Status: AC
  Administered 2022-12-15: 100 [IU] via INTRAMUSCULAR

## 2022-12-15 NOTE — Progress Notes (Signed)
Subjective:    Patient ID: Matthew Black, male    DOB: [birth year], 47 y.o.   MRN: [id number]  HPI  Matthew Black is a 47 year old man who presents for f/u MCA CVA, spasticity, shoulder pain, and aggression.  1) Spasticity: -Botox to pec was very helpful, spasticity has returned after 3 months -he asks if he can get his Botox soon as he is not able to do PT until he gets this -has been taking Baclofen three times per day, it does make him sleepy -he is willing to try additional antispasticity medicine at night to help with his spasticity until he can get in for Botox -Botox is effective for pain relief and he needs it every 42month -His PT also noticed pain has been much better controlled -He keeps stretching out left arm with his right and this has helped to keep the arm loose.   2) Aggression: -Discussed that he has follow-up scheduled with Dr. RSima Matas   3) Shoulder pain -has developed shoulder pain this week that brings him to tears -he says it is hurting all over his shoulder joint -does not feel like the isolated pec pain he has had in the past  4) HTN -he needs refill of his metoprolol -his SBP has been 1teens! -he has been taking his medication regularly  5) CVA -he asks about Vivistim Washing dishes and drying them.  Kidney stones resolved following procedure.  IVC filter removed He enjoys cooking, working on stairs. He likes to cook spaghetti and eggs. He is able to walk around the the home. He is able to walk outside as well and wife says that he often walks in front of her.  He plays Scrabble with his kids and enjoys spending time with them.  Has started drinking 1 coffee every morning.   He asks about alcohol use. Has been drinking wine, rum.  Wife states that tightness in elbow and knee flexors has been better and improves the more he walks.  Has sense of humor back.  Headaches have resolved.  He has been driving with his parents next to him and has been doing  a great job. No issues except when he tries to get out of the car as he needs he AD at this time. His wife asks about a platform walker for him.   6) Impaired mobility and ADLs: - Matthew Black not yet received stairlift -he is benefitting from aquatherapy, he restarted 1/8 -he feels if he had been doing this from the start he would have had an easier time walking -he is using a cane and has assistance from his brother today -needs a new handicap placard  Prior history: Matthew Black a a 47year old man who presents for hospital follow-up after right middle cerebral artery stroke. His wife accompanies him and provides most of the history.   No longer has pain at surgical site. Wife asks if he should have repeat CT scan. No longer has headache.   Has been having some right sided abdominal pain. Has kidney stone. Has been having daily BM but they are hard. Also has gas.    He has completed his PT and is able to ambulate around his home.   His wife says that prior to his stroke, he liked to play basketball with their kids (ages 155and 120, play with their dog, travel, and relax at home. He has told her that he is happy to be at home.  He has had tightness in his right elbow and knee flexors and this has been painful to him. He has had two falls and his leg stiffness may have contributed to this.   Pain Inventory Average Pain 0 Pain Right Now 0 My pain is  no pain , left sided weakness. Here for Stroke follow up.  In the last 24 hours, has pain interfered with the following? General activity 0 Relation with others 0 Enjoyment of life 0 What TIME of day is your pain at its worst?  no pain Sleep (in general) Good  Pain is worse with:  no pain Pain improves with:  na Relief from Meds:  no pain  7 Bowel movements per week No problem with urination.      Family History  Problem Relation Age of Onset   Healthy Mother    Healthy Father    Clotting disorder Maternal Grandmother     Diabetes Paternal Grandfather    Clotting disorder Maternal Aunt    Clotting disorder Maternal Uncle    Colon cancer Neg Hx    Social History   Socioeconomic History   Marital status: Married    Spouse name: Melene Muller   Number of children: 2   Years of education: Not on file   Highest education level: Not on file  Occupational History   Occupation: unemployed 01/30/21  Tobacco Use   Smoking status: Former    Packs/day: 0.00    Types: Cigarettes    Quit date: 11/24/2015    Years since quitting: 7.0   Smokeless tobacco: Never   Tobacco comments:       Vaping Use   Vaping Use: Never used  Substance and Sexual Activity   Alcohol use: Yes    Comment: socially   Drug use: Never   Sexual activity: Not Currently  Other Topics Concern   Not on file  Social History Narrative   Lives with wife and kids   Right Handed   Drinks >10 cups caffeine daily   Social Determinants of Health   Financial Resource Strain: Not on file  Food Insecurity: Not on file  Transportation Needs: Not on file  Physical Activity: Not on file  Stress: Not on file  Social Connections: Not on file   Past Surgical History:  Procedure Laterality Date   CRANIOPLASTY Right 06/05/2020   Procedure: CRANIOPLASTY;  Surgeon: Consuella Lose, MD;  Location: [place];  Service: Neurosurgery;  Laterality: Right;  right   CRANIOTOMY Right 07/19/2019   Procedure: RIGHT HEMI-CRANIECTOMY With implantation of skull flap to abdominal wall;  Surgeon: Consuella Lose, MD;  Location: [place];  Service: Neurosurgery;  Laterality: Right;   CYST EXCISION N/A 10/08/2016   Procedure: EXCISION OF POSTERIOR NECK CYST;  Surgeon: Clovis Riley, MD;  [facility];  Service: General;  Laterality: N/A;   CYSTOSCOPY/URETEROSCOPY/HOLMIUM LASER/STENT PLACEMENT Right 03/16/2020   Procedure: CYSTOSCOPY RIGHT RETROGRADE PYELOGRAM URETEROSCOPY/HOLMIUM LASER/STENT PLACEMENT;  Surgeon: Lucas Mallow, MD;  [facility];  Service:  Urology;  Laterality: Right;   INCISION AND DRAINAGE ABSCESS N/A 09/22/2014   Procedure: INCISION AND DRAINAGE ABSCESS POSTERIOR NECK;  Surgeon: Pedro Earls, MD;  [facility];  Service: General;  Laterality: N/A;   INCISION AND DRAINAGE ABSCESS N/A 12/20/2015   Procedure: INCISION AND DRAINAGE POSTERIOR NECK MASS;  Surgeon: Armandina Gemma, MD;  [facility];  Service: General;  Laterality: N/A;   INCISION AND DRAINAGE ABSCESS Left 07/10/2004   middle finger  IR IVC FILTER PLMT / S&I /IMG GUID/MOD SED  07/21/2019   IR RADIOLOGIST EVAL & MGMT  12/14/2019   IR RADIOLOGIST EVAL & MGMT  12/21/2020   IR VENOGRAM RENAL UNI RIGHT  07/21/2019   MASS EXCISION N/A 07/21/2017   Procedure: EXCISION OF BENIGN NECK LESION WITH LAYERED CLOSURE;  Surgeon: Irene Limbo, MD;  Location: [place];  Service: Plastics;  Laterality: N/A;   MASS EXCISION N/A 11/10/2017   Procedure: EXCISION BENIGN LESION OF THE NECK WITH LAYERED CLOSURE;  Surgeon: Irene Limbo, MD;  Location: [place];  Service: Plastics;  Laterality: N/A;   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   Depression    DVT (deep venous thrombosis) ([place])    BLE DVT 07/21/19, 08/02/19; s/p retrievable IVC filter 07/21/19   Hemorrhagic stroke [facility])    History of kidney stones    Hypertension    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   PE (pulmonary thromboembolism) ([place])    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   Stroke ([facility])    RICA, R A1, R MCA occlusion 07/19/19   BP 115/79   Pulse 74   Temp 97.7 F (36.5 C)   Ht 6' (1.829 m)   SpO2 97%   BMI 32.28 kg/m   Opioid Risk Score:   Fall Risk Score:  `1  Depression screen PHQ 2/9     12/15/2022    1:05 PM 06/10/2022   11:24 AM 01/21/2022   11:24 AM 09/24/2021   11:15 AM 07/19/2021    1:58 PM 06/18/2021   11:36 AM 03/21/2021   12:11 PM  Depression screen PHQ 2/9  Decreased Interest 0 0 0 0 0 0 0   Down, Depressed, Hopeless 0 0 0 0 0 0 0  PHQ - 2 Score 0 0 0 0 0 0 0  Altered sleeping     0    Tired, decreased energy     0    Change in appetite     0    Feeling bad or failure about yourself      0    Trouble concentrating     0    Moving slowly or fidgety/restless     0    Suicidal thoughts     0    PHQ-9 Score     0    Difficult doing work/chores     Not difficult at all       Review of Systems +left sided hemiplegia +left pectoralis tightness    Objective:   Physical Exam Gen: no distress, normal appearing HEENT: oral mucosa pink and moist, NCAT Cardio: Reg rate Chest: normal effort, normal rate of breathing Abd: soft, non-distended Ext: no edema Psych: pleasant, normal affect Skin: intact Neuro: Alert and oriented x3, slowed speech Musculoskeletal: +left pectoralis tightness, MAS 1 throughout left arm     Assessment & Plan:  Matthew Black is a a 47 year old man who presents for f/u following his right middle cerebral artery stroke.   1) Headache -Continues to be resolved. Advised that he does not required repeat CT Head unless he develops symptoms and signs of infection or bleed--discussed which to look out for.   2) Abdominal pain: -Resolved following surgical removal of kidney stones  3) Weakness/spasticity: -Left sided tone much improved after Botox. Will target left pec major again. Will repeat every 3 months. Arm is currently without  signficant tone -Asked staff to prioritize him as #1 on waitlist for Botox -prescribed tizanidine to help his spasticity until he is able to get in -Encouraged him to keep stretching left arm.  -Discussed transcranial magnetic stimulation as a an option to improve muscle strength. Also discussed Sprint PNS as an option. Provided links and phones numbers for both options for wife to investigate further.  -Made goal to walk outside 15 minutes per day, start resistance training with low weights.  -Provided script for platform  walker -Provided script for occupational therapy.  Botox Injection for spasticity using needle EMG guidance  Dilution: 1:1 Units/ml Indication: Severe spasticity which interferes with ADL,mobility and/or  hygiene and is unresponsive to medication management and other conservative care Informed consent was obtained after describing risks and benefits of the procedure with the patient. This includes bleeding, bruising, infection, excessive weakness, or medication side effects. A REMS form is on file and signed.  Pectoralis: 100  All injections were done after obtaining appropriate [place] visualization and after negative drawback for blood. The patient tolerated the procedure well. Post procedure instructions were given. A followup appointment was made.    4) Aggression: -provided referral to see Dr. Sima Matas  5) shoulder pain -prescribed steroid dose pack -if does not help, can consider steroid injection into the joint.   6) MCA stroke: -provided with handicap placard -discussed Vivistim, discussed that I don't believe he will qualify for current trials given his current level of weakness  7) HTN -continue metoprolol  8) Impaired mobility and ADLs -completed CAP forms due to a need for a lift at home so he can ascend the stairs, advised that if he does not hear about these in a week he should let me know -continue aquatherapy  Cranial incision healing well.   Quality of life: Discussed spending time with his kids, playing games to keep his mind sharp.    All questions were encouraged and answered. Follow up with me in 3 months for repeat Botox

## 2022-12-22 ENCOUNTER — Encounter: Payer: Self-pay | Admitting: Occupational Therapy

## 2022-12-22 ENCOUNTER — Ambulatory Visit: Payer: Medicare Other | Admitting: Occupational Therapy

## 2022-12-22 DIAGNOSIS — M6281 Muscle weakness (generalized): Secondary | ICD-10-CM

## 2022-12-22 DIAGNOSIS — M25612 Stiffness of left shoulder, not elsewhere classified: Secondary | ICD-10-CM

## 2022-12-22 DIAGNOSIS — G8929 Other chronic pain: Secondary | ICD-10-CM

## 2022-12-22 DIAGNOSIS — R2681 Unsteadiness on feet: Secondary | ICD-10-CM

## 2022-12-22 DIAGNOSIS — I69354 Hemiplegia and hemiparesis following cerebral infarction affecting left non-dominant side: Secondary | ICD-10-CM

## 2022-12-22 DIAGNOSIS — R208 Other disturbances of skin sensation: Secondary | ICD-10-CM

## 2022-12-22 NOTE — Therapy (Signed)
OUTPATIENT OCCUPATIONAL THERAPY NEURO EVALUATION  Patient Name: Matthew Black MRN: [id number] DOB:1976/07/22, 47 y.o., male Today's Date: 12/01/2022  PCP: Mackie Pai, PA-C REFERRING PROVIDER: Leeroy Cha  END OF SESSION:  OT End of Session - 12/01/22 1712     Visit Number 1    Number of Visits 9    Date for OT Re-Evaluation 02/14/23    Authorization Type Medicare A&B/Medicaid    Progress Note Due on Visit 10    OT Start Time 1415    OT Stop Time 1500    OT Time Calculation (min) 45 min    Equipment Utilized During Treatment floatation,    Activity Tolerance Patient tolerated treatment well             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   Depression    DVT (deep venous thrombosis) ([place])    BLE DVT 07/21/19, 08/02/19; s/p retrievable IVC filter 07/21/19   Hemorrhagic stroke ([place])    History of kidney stones    Hypertension    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   PE (pulmonary thromboembolism) ([place])    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   Stroke ([place])    RICA, R A1, R MCA occlusion 07/19/19   Past Surgical History:  Procedure Laterality Date   CRANIOPLASTY Right 06/05/2020   Procedure: CRANIOPLASTY;  Surgeon: Consuella Lose, MD;  Location: [place];  Service: Neurosurgery;  Laterality: Right;  right   CRANIOTOMY Right 07/19/2019   Procedure: RIGHT HEMI-CRANIECTOMY With implantation of skull flap to abdominal wall;  Surgeon: Consuella Lose, MD;  Location: [place];  Service: Neurosurgery;  Laterality: Right;   CYST EXCISION N/A 10/08/2016   Procedure: EXCISION OF POSTERIOR NECK CYST;  Surgeon: Clovis Riley, MD;  [facility];  Service: General;  Laterality: N/A;   CYSTOSCOPY/URETEROSCOPY/HOLMIUM LASER/STENT PLACEMENT Right 03/16/2020   Procedure: CYSTOSCOPY RIGHT RETROGRADE PYELOGRAM URETEROSCOPY/HOLMIUM LASER/STENT PLACEMENT;  Surgeon: Lucas Mallow, MD;  [facility];  Service:  Urology;  Laterality: Right;   INCISION AND DRAINAGE ABSCESS N/A 09/22/2014   Procedure: INCISION AND DRAINAGE ABSCESS POSTERIOR NECK;  Surgeon: Pedro Earls, MD;  [facility];  Service: General;  Laterality: N/A;   INCISION AND DRAINAGE ABSCESS N/A 12/20/2015   Procedure: INCISION AND DRAINAGE POSTERIOR NECK MASS;  Surgeon: Armandina Gemma, MD;  [facility];  Service: General;  Laterality: N/A;   INCISION AND DRAINAGE ABSCESS Left 07/10/2004   middle finger   IR IVC FILTER PLMT / S&I /IMG GUID/MOD SED  07/21/2019   IR RADIOLOGIST EVAL & MGMT  12/14/2019   IR RADIOLOGIST EVAL & MGMT  12/21/2020   IR VENOGRAM RENAL UNI RIGHT  07/21/2019   MASS EXCISION N/A 07/21/2017   Procedure: EXCISION OF BENIGN NECK LESION WITH LAYERED CLOSURE;  Surgeon: Irene Limbo, MD;  Location: [place];  Service: Plastics;  Laterality: N/A;   MASS EXCISION N/A 11/10/2017   Procedure: EXCISION BENIGN LESION OF THE NECK WITH LAYERED CLOSURE;  Surgeon: Irene Limbo, MD;  Location: [place];  Service: Plastics;  Laterality: N/A;   Patient Active Problem List   Diagnosis Date Noted   COVID-19 virus infection 04/21/2021   Left-sided weakness 04/21/2021   S/P craniotomy 06/05/2020   History of cranioplasty 06/05/2020   Peri-rectal abscess 02/14/2020   Abnormal CT scan, pelvis 02/14/2020   Pancytopenia ([place]) 12/02/2019   Nephrolithiasis 12/02/2019   Hydronephrosis  with renal and ureteral calculus obstruction 12/02/2019   Rectal pain 12/02/2019   Hyperkalemia 12/02/2019   Reactive depression    Wound infection after surgery    Sleep disturbance    Dysphagia, post-stroke    Transaminitis    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   Cerebral abscess    Urinary tract infection without hematuria    Altered mental status    Primary hypercoagulable state ([place])    Acute pulmonary embolism without acute cor pulmonale (HCC)    Deep vein thrombosis (DVT) of  non-extremity vein    Hypokalemia    Acute blood loss anemia    Leukocytosis    Endotracheal tube present    Acute respiratory failure with hypoxemia (HCC)    Stroke (cerebrum) ([place]) 07/19/2019   Pressure injury of skin 07/19/2019   Acute CVA (cerebrovascular accident) ([place])    Encephalopathy    Dysphagia    Acute encephalopathy    Essential hypertension    Obesity 03/12/2016   Scalp abscess 12/20/2015   Neck abscess 12/20/2015   Pilonidal cyst 02/08/2013    ONSET DATE: 10/20/22 referral date  REFERRING DIAG: Spastic hemilegia  THERAPY DIAG:  Spastic hemiplegia of left nondominant side as late effect of cerebral infarction ([place])  Other disturbances of skin sensation  Unsteadiness on feet  Muscle weakness (generalized)  Chronic left shoulder pain  Abnormal posture  Stiffness of left shoulder, not elsewhere classified  Apraxia  Other symptoms and signs involving the musculoskeletal system  Rationale for Evaluation and Treatment: Rehabilitation  SUBJECTIVE:   SUBJECTIVE STATEMENT: I could walk to the bathroom without my brace after the pool.   Pt accompanied by: family member  PERTINENT HISTORY: hemorrhagic stroke  PRECAUTIONS: Fall  WEIGHT BEARING RESTRICTIONS: No  PAIN:  Are you having pain? Yes: NPRS scale: 9/10 Pain location: left arm Pain description: aching, sharp Aggravating factors: movement Relieving factors: Tylenol  FALLS: Has patient fallen in last 6 months? yes  LIVING ENVIRONMENT: Lives with: lives with their family and lives with their spouse Lives in: House/apartment Stairs: yes Has following equipment at home: Wheelchair (manual) and hospital bed  PLOF: Independent with basic ADLs  PATIENT GOALS: reduce pain, improve mobility  OBJECTIVE:   HAND DOMINANCE: Right  ADLs: Overall ADLs: mod assist Transfers/ambulation related to ADLs: walks with quad cane Eating: Mod I Grooming: Mod I UB Dressing: Mod assist LB Dressing:  max assist Toileting: min assist Bathing: Mod assist Tub Shower transfers: Shower is upstairs - sponge bathes  IADLs: Shopping: Min assist Light housekeeping: Min assist Meal Prep: Min assist Community mobility: Min assist   MOBILITY STATUS: difficulty carrying objections with ambulation  POSTURE COMMENTS:  rounded shoulders, forward head, posterior pelvic tilt, and flexed trunk  Sitting balance: Supports self with one extremity    UPPER EXTREMITY ROM:    Passive ROM Right eval Left eval  Shoulder flexion WFL 40  Shoulder abduction  40  Shoulder adduction    Shoulder extension    Shoulder internal rotation    Shoulder external rotation    Elbow flexion    Elbow extension  -25  Wrist flexion    Wrist extension    Wrist ulnar deviation    Wrist radial deviation    Wrist pronation    Wrist supination    (Blank rows = not tested)  UPPER EXTREMITY MMT:     MMT Right eval Left eval  Shoulder flexion [facility] NT  Shoulder abduction    Shoulder adduction  Shoulder extension    Shoulder internal rotation    Shoulder external rotation    Middle trapezius    Lower trapezius    Elbow flexion    Elbow extension    Wrist flexion    Wrist extension    Wrist ulnar deviation    Wrist radial deviation    Wrist pronation    Wrist supination    (Blank rows = not tested)   COORDINATION: No formal assessment completed.  No volitional movement In LUE  SENSATION: Light touch: Impaired    MUSCLE TONE: LUE: Moderate  COGNITION: Overall cognitive status: Impaired   PERCEPTION: Impaired: Inattention/neglect: does not attend to left side of body  PRAXIS: Impaired: Ideomotor and Motor planning    TODAY'S TREATMENT:                                                                                                                              DATE: 12/22/22: Initiated aquatic training program.  Patient has missed sessions due to conflicting appointments and pool out of  operation.  Patient recent had Botox injection in LUE.  Patient with limited range in left ankle, knee, hip. Shoulder and elbow range improved since recent injection.  Patient asking about stroke support group.  Will get him those resources.  Recommend patient seek order for PT to address gait.    Discussed with patient need to establish a family member or friend to attend subsequent session to determine exercise program.    PATIENT EDUCATION: Education details: Aquatics program Person educated: Patient Education method: Explanation Education comprehension: verbalized understanding  HOME EXERCISE PROGRAM: TBD   GOALS: Goals reviewed with patient? Yes  SHORT TERM GOALS: Target date: 01/01/23  Patient will report decrease in pain in left shoulder by at least 3 points (0-10 scale) following aquatics exercise program  Goal status: INITIAL  2.  Patient will enter and exit the pool with one hand rail and supervision Baseline:  Goal status: INITIAL   LONG TERM GOALS: Target date: 01/30/23  Patient will complete an aquatic exercise program to address LUE/LLE range of motion Baseline:  Goal status: INITIAL  2.  Patient will complete an aquatic exercise program designed to address dynamic standing balance Baseline:  Goal status: INITIAL     ASSESSMENT:  CLINICAL IMPRESSION: Patient is seen for aquatic therapy visit to address spastic hemiplegia.   PERFORMANCE DEFICITS: in functional skills including ADLs, coordination, proprioception, sensation, tone, ROM, strength, pain, flexibility, Fine motor control, Gross motor control, mobility, balance, body mechanics, endurance, decreased knowledge of use of DME, and UE functional use, cognitive skills including attention, emotional, memory, problem solving, and safety awareness, and psychosocial skills including environmental adaptation.   IMPAIRMENTS: are limiting patient from ADLs.   CO-MORBIDITIES: may have co-morbidities  that  affects occupational performance. Patient will benefit from skilled OT to address above impairments and improve overall function.  MODIFICATION OR ASSISTANCE TO COMPLETE EVALUATION: Min-Moderate modification of tasks or  assist with assess necessary to complete an evaluation.  OT OCCUPATIONAL PROFILE AND HISTORY: Detailed assessment: Review of records and additional review of physical, cognitive, psychosocial history related to current functional performance.  CLINICAL DECISION MAKING: Moderate - several treatment options, min-mod task modification necessary  REHAB POTENTIAL: Good  EVALUATION COMPLEXITY: Moderate    PLAN:  OT FREQUENCY: 1x/week  OT DURATION: 8 weeks  PLANNED INTERVENTIONS: self care/ADL training, therapeutic exercise, therapeutic activity, neuromuscular re-education, balance training, functional mobility training, aquatic therapy, patient/family education, visual/perceptual remediation/compensation, and DME and/or AE instructions  RECOMMENDED OTHER SERVICES: NA  CONSULTED AND AGREED WITH PLAN OF CARE: Patient  PLAN FOR NEXT SESSION: Dynamic standing balance, LUE range of motion, sitting balance   Mariah Milling, OT 12/22/22 5:15 PM

## 2022-12-29 ENCOUNTER — Ambulatory Visit: Payer: Medicare Other | Admitting: Occupational Therapy

## 2023-01-08 ENCOUNTER — Other Ambulatory Visit: Payer: Self-pay

## 2023-01-08 ENCOUNTER — Other Ambulatory Visit: Payer: Self-pay | Admitting: Medical

## 2023-01-08 ENCOUNTER — Telehealth: Payer: Self-pay

## 2023-01-08 MED ORDER — ASPIRIN 81 MG PO TBEC: 81.0000 mg | DELAYED_RELEASE_TABLET | Freq: Every day | ORAL | 1 refills | Status: DC

## 2023-01-08 NOTE — Telephone Encounter (Signed)
Received fax from [facility]- they wanted to check sig of ASA 43m- listed as 5 times daily dosing. Chart reviewed- ASA has been refilled as 5 times daily since 10/23 however OV note w/ neurology on 6/23 stated to continue ASA 874monce daily and verified PCP's note on 08/15/22 which also stated to continue ASA 8164mnce daily. Rx sent to [facility] w/ correct sig.

## 2023-01-12 ENCOUNTER — Ambulatory Visit: Payer: Self-pay | Admitting: Occupational Therapy

## 2023-01-19 ENCOUNTER — Ambulatory Visit: Payer: Medicare Other | Attending: Medical | Admitting: Occupational Therapy

## 2023-01-19 ENCOUNTER — Encounter: Payer: Self-pay | Admitting: Occupational Therapy

## 2023-01-19 DIAGNOSIS — G8929 Other chronic pain: Secondary | ICD-10-CM | POA: Diagnosis present

## 2023-01-19 DIAGNOSIS — M6281 Muscle weakness (generalized): Secondary | ICD-10-CM | POA: Diagnosis present

## 2023-01-19 DIAGNOSIS — R2681 Unsteadiness on feet: Secondary | ICD-10-CM | POA: Insufficient documentation

## 2023-01-19 DIAGNOSIS — I69354 Hemiplegia and hemiparesis following cerebral infarction affecting left non-dominant side: Secondary | ICD-10-CM | POA: Diagnosis present

## 2023-01-19 DIAGNOSIS — R208 Other disturbances of skin sensation: Secondary | ICD-10-CM | POA: Diagnosis present

## 2023-01-19 DIAGNOSIS — M25512 Pain in left shoulder: Secondary | ICD-10-CM | POA: Diagnosis present

## 2023-01-19 NOTE — Therapy (Signed)
OUTPATIENT OCCUPATIONAL THERAPY NEURO EVALUATION  Patient Name: Matthew Black MRN: [id number] DOB:1976-10-10, 47 y.o., male Today's Date: 12/01/2022  PCP: Mackie Pai, PA-C REFERRING PROVIDER: Leeroy Cha  END OF SESSION:  OT End of Session - 12/01/22 1712     Visit Number 3   Number of Visits 9    Date for OT Re-Evaluation 02/14/23    Authorization Type Medicare A&B/Medicaid    Progress Note Due on Visit 10    OT Start Time 1415    OT Stop Time 1500    OT Time Calculation (min) 45 min    Equipment Utilized During Treatment floatation,    Activity Tolerance Patient tolerated treatment well             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   Depression    DVT (deep venous thrombosis) ([place])    BLE DVT 07/21/19, 08/02/19; s/p retrievable IVC filter 07/21/19   Hemorrhagic stroke ([place])    History of kidney stones    Hypertension    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   PE (pulmonary thromboembolism) ([place])    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   Stroke ([place])    RICA, R A1, R MCA occlusion 07/19/19   Past Surgical History:  Procedure Laterality Date   CRANIOPLASTY Right 06/05/2020   Procedure: CRANIOPLASTY;  Surgeon: Consuella Lose, MD;  Location: [place];  Service: Neurosurgery;  Laterality: Right;  right   CRANIOTOMY Right 07/19/2019   Procedure: RIGHT HEMI-CRANIECTOMY With implantation of skull flap to abdominal wall;  Surgeon: Consuella Lose, MD;  Location: [place];  Service: Neurosurgery;  Laterality: Right;   CYST EXCISION N/A 10/08/2016   Procedure: EXCISION OF POSTERIOR NECK CYST;  Surgeon: Clovis Riley, MD;  [facility];  Service: General;  Laterality: N/A;   CYSTOSCOPY/URETEROSCOPY/HOLMIUM LASER/STENT PLACEMENT Right 03/16/2020   Procedure: CYSTOSCOPY RIGHT RETROGRADE PYELOGRAM URETEROSCOPY/HOLMIUM LASER/STENT PLACEMENT;  Surgeon: Lucas Mallow, MD;  [facility];  Service: Urology;   Laterality: Right;   INCISION AND DRAINAGE ABSCESS N/A 09/22/2014   Procedure: INCISION AND DRAINAGE ABSCESS POSTERIOR NECK;  Surgeon: Pedro Earls, MD;  [facility];  Service: General;  Laterality: N/A;   INCISION AND DRAINAGE ABSCESS N/A 12/20/2015   Procedure: INCISION AND DRAINAGE POSTERIOR NECK MASS;  Surgeon: Armandina Gemma, MD;  [facility];  Service: General;  Laterality: N/A;   INCISION AND DRAINAGE ABSCESS Left 07/10/2004   middle finger   IR IVC FILTER PLMT / S&I /IMG GUID/MOD SED  07/21/2019   IR RADIOLOGIST EVAL & MGMT  12/14/2019   IR RADIOLOGIST EVAL & MGMT  12/21/2020   IR VENOGRAM RENAL UNI RIGHT  07/21/2019   MASS EXCISION N/A 07/21/2017   Procedure: EXCISION OF BENIGN NECK LESION WITH LAYERED CLOSURE;  Surgeon: Irene Limbo, MD;  Location: [place];  Service: Plastics;  Laterality: N/A;   MASS EXCISION N/A 11/10/2017   Procedure: EXCISION BENIGN LESION OF THE NECK WITH LAYERED CLOSURE;  Surgeon: Irene Limbo, MD;  Location: [place];  Service: Plastics;  Laterality: N/A;   Patient Active Problem List   Diagnosis Date Noted   COVID-19 virus infection 04/21/2021   Left-sided weakness 04/21/2021   S/P craniotomy 06/05/2020   History of cranioplasty 06/05/2020   Peri-rectal abscess 02/14/2020   Abnormal CT scan, pelvis 02/14/2020   Pancytopenia ([place]) 12/02/2019   Nephrolithiasis 12/02/2019   Hydronephrosis with  renal and ureteral calculus obstruction 12/02/2019   Rectal pain 12/02/2019   Hyperkalemia 12/02/2019   Reactive depression    Wound infection after surgery    Sleep disturbance    Dysphagia, post-stroke    Transaminitis    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   Cerebral abscess    Urinary tract infection without hematuria    Altered mental status    Primary hypercoagulable state ([place])    Acute pulmonary embolism without acute cor pulmonale (HCC)    Deep vein thrombosis (DVT) of  non-extremity vein    Hypokalemia    Acute blood loss anemia    Leukocytosis    Endotracheal tube present    Acute respiratory failure with hypoxemia (HCC)    Stroke (cerebrum) ([place]) 07/19/2019   Pressure injury of skin 07/19/2019   Acute CVA (cerebrovascular accident) ([place])    Encephalopathy    Dysphagia    Acute encephalopathy    Essential hypertension    Obesity 03/12/2016   Scalp abscess 12/20/2015   Neck abscess 12/20/2015   Pilonidal cyst 02/08/2013    ONSET DATE: 10/20/22 referral date  REFERRING DIAG: Spastic hemilegia  THERAPY DIAG:  Spastic hemiplegia of left nondominant side as late effect of cerebral infarction ([place])  Other disturbances of skin sensation  Unsteadiness on feet  Muscle weakness (generalized)  Chronic left shoulder pain  Abnormal posture  Stiffness of left shoulder, not elsewhere classified  Apraxia  Other symptoms and signs involving the musculoskeletal system  Rationale for Evaluation and Treatment: Rehabilitation  SUBJECTIVE:   SUBJECTIVE STATEMENT: I could walk to the bathroom without my brace after the pool.   Pt accompanied by: family member  PERTINENT HISTORY: hemorrhagic stroke  PRECAUTIONS: Fall  WEIGHT BEARING RESTRICTIONS: No  PAIN:  Are you having pain? Yes: NPRS scale: 9/10 Pain location: left arm Pain description: aching, sharp Aggravating factors: movement Relieving factors: Tylenol  FALLS: Has patient fallen in last 6 months? yes  LIVING ENVIRONMENT: Lives with: lives with their family and lives with their spouse Lives in: House/apartment Stairs: yes Has following equipment at home: Wheelchair (manual) and hospital bed  PLOF: Independent with basic ADLs  PATIENT GOALS: reduce pain, improve mobility  OBJECTIVE:   HAND DOMINANCE: Right  ADLs: Overall ADLs: mod assist Transfers/ambulation related to ADLs: walks with quad cane Eating: Mod I Grooming: Mod I UB Dressing: Mod assist LB Dressing:  max assist Toileting: min assist Bathing: Mod assist Tub Shower transfers: Shower is upstairs - sponge bathes  IADLs: Shopping: Min assist Light housekeeping: Min assist Meal Prep: Min assist Community mobility: Min assist   MOBILITY STATUS: difficulty carrying objections with ambulation  POSTURE COMMENTS:  rounded shoulders, forward head, posterior pelvic tilt, and flexed trunk  Sitting balance: Supports self with one extremity    UPPER EXTREMITY ROM:    Passive ROM Right eval Left eval  Shoulder flexion WFL 40  Shoulder abduction  40  Shoulder adduction    Shoulder extension    Shoulder internal rotation    Shoulder external rotation    Elbow flexion    Elbow extension  -25  Wrist flexion    Wrist extension    Wrist ulnar deviation    Wrist radial deviation    Wrist pronation    Wrist supination    (Blank rows = not tested)  UPPER EXTREMITY MMT:     MMT Right eval Left eval  Shoulder flexion [facility] NT  Shoulder abduction    Shoulder adduction  Shoulder extension    Shoulder internal rotation    Shoulder external rotation    Middle trapezius    Lower trapezius    Elbow flexion    Elbow extension    Wrist flexion    Wrist extension    Wrist ulnar deviation    Wrist radial deviation    Wrist pronation    Wrist supination    (Blank rows = not tested)   COORDINATION: No formal assessment completed.  No volitional movement In LUE  SENSATION: Light touch: Impaired    MUSCLE TONE: LUE: Moderate  COGNITION: Overall cognitive status: Impaired   PERCEPTION: Impaired: Inattention/neglect: does not attend to left side of body  PRAXIS: Impaired: Ideomotor and Motor planning    TODAY'S TREATMENT:                                                                                                                              DATE: 01/19/23: Patient seen for aquatic therapy visit.  Patient entered and exited the pool via stairs and min assist.  Patient  reports having a fall yesterday from his chair whereas he was reaching down to pick something up and fell to floor.  Patient's son called 911 to have assitance to get back into chair.   Patient reports - my family wanted to thank you for helping me.  The other day I went to a restaurant and I did not need much help at all.  Patient also reports that he has been taking his baclofen as ordered (3x/day) and reports less tension in left arm/leg.   Worked on dynamic standing balance without UE support.  Worked on maintaining two feet on floor while rotating trunk and Ues right/left.  Worked on mild perturbations - standing and moving arms forward and backward.  Worked to increase stance position of left leg, and also left shoulder abduction range of motion.   Continue to recommend patient seek order for PT to address gait.    Discussed with patient need to establish a family member or friend to attend subsequent session to determine exercise program.    PATIENT EDUCATION: Education details: Aquatics program Person educated: Patient Education method: Explanation Education comprehension: verbalized understanding  HOME EXERCISE PROGRAM: TBD   GOALS: Goals viewed with patient? Yes  SHORT TERM GOALS: Target date: 01/01/23  Patient will report decrease in pain in left shoulder by at least 3 points (0-10 scale) following aquatics exercise program  Goal status: INITIAL  2.  Patient will enter and exit the pool with one hand rail and supervision Baseline:  Goal status: INITIAL   LONG TERM GOALS: Target date: 01/30/23  Patient will complete an aquatic exercise program to address LUE/LLE range of motion Baseline:  Goal status: INITIAL  2.  Patient will complete an aquatic exercise program designed to address dynamic standing balance Baseline:  Goal status: INITIAL     ASSESSMENT:  CLINICAL IMPRESSION: Patient is seen for aquatic  therapy visit to address spastic hemiplegia.   PERFORMANCE  DEFICITS: in functional skills including ADLs, coordination, proprioception, sensation, tone, ROM, strength, pain, flexibility, Fine motor control, Gross motor control, mobility, balance, body mechanics, endurance, decreased knowledge of use of DME, and UE functional use, cognitive skills including attention, emotional, memory, problem solving, and safety awareness, and psychosocial skills including environmental adaptation.   IMPAIRMENTS: are limiting patient from ADLs.   CO-MORBIDITIES: may have co-morbidities  that affects occupational performance. Patient will benefit from skilled OT to address above impairments and improve overall function.  MODIFICATION OR ASSISTANCE TO COMPLETE EVALUATION: Min-Moderate modification of tasks or assist with assess necessary to complete an evaluation.  OT OCCUPATIONAL PROFILE AND HISTORY: Detailed assessment: Review of records and additional review of physical, cognitive, psychosocial history related to current functional performance.  CLINICAL DECISION MAKING: Moderate - several treatment options, min-mod task modification necessary  REHAB POTENTIAL: Good  EVALUATION COMPLEXITY: Moderate    PLAN:  OT FREQUENCY: 1x/week  OT DURATION: 8 weeks  PLANNED INTERVENTIONS: self care/ADL training, therapeutic exercise, therapeutic activity, neuromuscular re-education, balance training, functional mobility training, aquatic therapy, patient/family education, visual/perceptual remediation/compensation, and DME and/or AE instructions  RECOMMENDED OTHER SERVICES: NA  CONSULTED AND AGREED WITH PLAN OF CARE: Patient  PLAN FOR NEXT SESSION: Dynamic standing balance, LUE range of motion, sitting balance   Mariah Milling, OT 12/22/22 5:15 PM

## 2023-01-21 ENCOUNTER — Other Ambulatory Visit: Payer: Self-pay | Admitting: Physical Medicine and Rehabilitation

## 2023-01-21 DIAGNOSIS — R252 Cramp and spasm: Secondary | ICD-10-CM

## 2023-01-21 DIAGNOSIS — I69352 Hemiplegia and hemiparesis following cerebral infarction affecting left dominant side: Secondary | ICD-10-CM

## 2023-01-26 ENCOUNTER — Ambulatory Visit: Payer: Medicare Other | Attending: Medical | Admitting: Physical Therapy

## 2023-01-26 DIAGNOSIS — I69352 Hemiplegia and hemiparesis following cerebral infarction affecting left dominant side: Secondary | ICD-10-CM | POA: Insufficient documentation

## 2023-01-26 DIAGNOSIS — M6281 Muscle weakness (generalized): Secondary | ICD-10-CM | POA: Diagnosis present

## 2023-01-26 DIAGNOSIS — R208 Other disturbances of skin sensation: Secondary | ICD-10-CM | POA: Insufficient documentation

## 2023-01-26 DIAGNOSIS — R252 Cramp and spasm: Secondary | ICD-10-CM | POA: Insufficient documentation

## 2023-01-26 DIAGNOSIS — R2689 Other abnormalities of gait and mobility: Secondary | ICD-10-CM

## 2023-01-26 DIAGNOSIS — R293 Abnormal posture: Secondary | ICD-10-CM | POA: Diagnosis present

## 2023-01-26 DIAGNOSIS — R2681 Unsteadiness on feet: Secondary | ICD-10-CM

## 2023-01-26 DIAGNOSIS — I69354 Hemiplegia and hemiparesis following cerebral infarction affecting left non-dominant side: Secondary | ICD-10-CM

## 2023-01-26 NOTE — Therapy (Signed)
OUTPATIENT PHYSICAL THERAPY NEURO EVALUATION   Patient Name: Matthew Black MRN: [id number] DOB:1976-06-26, 47 y.o., male 32 Date: 01/26/2023   PCP: Mackie Pai, PA-C REFERRING PROVIDER: Izora Ribas, MD  END OF SESSION:  PT End of Session - 01/26/23 1537     Visit Number 1    Number of Visits 59   with eval   Date for PT Re-Evaluation 03/23/23    Authorization Type Medicare    PT Start Time 1535   pt arrived late   PT Stop Time 1615    PT Time Calculation (min) 40 min    Equipment Utilized During Treatment Gait belt    Activity Tolerance Patient tolerated treatment well;Patient limited by fatigue    Behavior During Therapy [facility] for tasks assessed/performed             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   Depression    DVT (deep venous thrombosis) ([place])    BLE DVT 07/21/19, 08/02/19; s/p retrievable IVC filter 07/21/19   Hemorrhagic stroke ([place])    History of kidney stones    Hypertension    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   PE (pulmonary thromboembolism) ([place])    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   Stroke ([place])    RICA, R A1, R MCA occlusion 07/19/19   Past Surgical History:  Procedure Laterality Date   CRANIOPLASTY Right 06/05/2020   Procedure: CRANIOPLASTY;  Surgeon: Consuella Lose, MD;  Location: [place];  Service: Neurosurgery;  Laterality: Right;  right   CRANIOTOMY Right 07/19/2019   Procedure: RIGHT HEMI-CRANIECTOMY With implantation of skull flap to abdominal wall;  Surgeon: Consuella Lose, MD;  Location: [place];  Service: Neurosurgery;  Laterality: Right;   CYST EXCISION N/A 10/08/2016   Procedure: EXCISION OF POSTERIOR NECK CYST;  Surgeon: Clovis Riley, MD;  [facility];  Service: General;  Laterality: N/A;   CYSTOSCOPY/URETEROSCOPY/HOLMIUM LASER/STENT PLACEMENT Right 03/16/2020   Procedure: CYSTOSCOPY RIGHT RETROGRADE PYELOGRAM URETEROSCOPY/HOLMIUM LASER/STENT  PLACEMENT;  Surgeon: Lucas Mallow, MD;  [facility];  Service: Urology;  Laterality: Right;   INCISION AND DRAINAGE ABSCESS N/A 09/22/2014   Procedure: INCISION AND DRAINAGE ABSCESS POSTERIOR NECK;  Surgeon: Pedro Earls, MD;  [facility];  Service: General;  Laterality: N/A;   INCISION AND DRAINAGE ABSCESS N/A 12/20/2015   Procedure: INCISION AND DRAINAGE POSTERIOR NECK MASS;  Surgeon: Armandina Gemma, MD;  [facility];  Service: General;  Laterality: N/A;   INCISION AND DRAINAGE ABSCESS Left 07/10/2004   middle finger   IR IVC FILTER PLMT / S&I /IMG GUID/MOD SED  07/21/2019   IR RADIOLOGIST EVAL & MGMT  12/14/2019   IR RADIOLOGIST EVAL & MGMT  12/21/2020   IR VENOGRAM RENAL UNI RIGHT  07/21/2019   MASS EXCISION N/A 07/21/2017   Procedure: EXCISION OF BENIGN NECK LESION WITH LAYERED CLOSURE;  Surgeon: Irene Limbo, MD;  Location: [place];  Service: Plastics;  Laterality: N/A;   MASS EXCISION N/A 11/10/2017   Procedure: EXCISION BENIGN LESION OF THE NECK WITH LAYERED CLOSURE;  Surgeon: Irene Limbo, MD;  Location: [place];  Service: Plastics;  Laterality: N/A;   Patient Active Problem List   Diagnosis Date Noted   COVID-19 virus infection 04/21/2021   Left-sided weakness 04/21/2021   S/P craniotomy 06/05/2020   History of cranioplasty 06/05/2020   Peri-rectal abscess 02/14/2020   Abnormal CT  scan, pelvis 02/14/2020   Pancytopenia ([place]) 12/02/2019   Nephrolithiasis 12/02/2019   Hydronephrosis with renal and ureteral calculus obstruction 12/02/2019   Rectal pain 12/02/2019   Hyperkalemia 12/02/2019   Reactive depression    Wound infection after surgery    Sleep disturbance    Dysphagia, post-stroke    Transaminitis    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   Cerebral abscess    Urinary tract infection without hematuria    Altered mental status    Primary hypercoagulable state ([place])    Acute pulmonary  embolism without acute cor pulmonale (HCC)    Deep vein thrombosis (DVT) of non-extremity vein    Hypokalemia    Acute blood loss anemia    Leukocytosis    Endotracheal tube present    Acute respiratory failure with hypoxemia (HCC)    Stroke (cerebrum) ([place]) 07/19/2019   Pressure injury of skin 07/19/2019   Acute CVA (cerebrovascular accident) ([place])    Encephalopathy    Dysphagia    Acute encephalopathy    Essential hypertension    Obesity 03/12/2016   Scalp abscess 12/20/2015   Neck abscess 12/20/2015   Pilonidal cyst 02/08/2013    ONSET DATE: 01/21/2023  REFERRING DIAG: R25.2 (ICD-10-CM) - Spasticity I69.352 (ICD-10-CM) - 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 (HCC)  THERAPY DIAG:  Unsteadiness on feet  Muscle weakness (generalized)  Abnormal posture  Other abnormalities of gait and mobility  Hemiplegia and hemiparesis following cerebral infarction affecting left non-dominant side ([place])  Rationale for Evaluation and Treatment: Rehabilitation  SUBJECTIVE:                                                                                                                                                                                             SUBJECTIVE STATEMENT: ***"Vania Rea said she wanted me to do PT to work on my balance" "I want to know about walking without the brace" when going to bathroom dont have time to put brace on; walking short distance from bathroom door to toilet without brace at home is hard  Pt drove himself to appointment today (has been driving for about 6 months)  Has an aide that comes into the home 5 days/week for 3 hours/day (help with ADLs, cooking, dressing, bathing); pt cannot don his AFO independently; wants to be able to don his sock and shoe independently  Pt accompanied by: self  PERTINENT HISTORY: ***pt had MCA CVA in 2020  PAIN:  Are you having pain? No  PRECAUTIONS: Fall  WEIGHT BEARING  RESTRICTIONS: No  FALLS: Has patient fallen in last 6  months? Yes. Number of falls 2; had to call fire department to get back up, didn't get hurt; one time I was in the wheelchair and I had a sock on and when I leaned over to reach for something I just slid out onto the floor; another time getting out of truck and running board was wet so slid onto the ground  LIVING ENVIRONMENT: Lives with: lives with their spouse Lives in: House/apartment Stairs: Yes: Internal: 12 steps; on right going up and External: 3 steps; bilateral but cannot reach both Has following equipment at home: Quad cane large base, Wheelchair (manual), and bed side commode  Tried going up stairs inside home about 8 months ago and fell down the stairs, stairs are carpeted.  PLOF: Independent with gait, Independent with transfers, and Requires assistive device for independence  PATIENT GOALS: ***"putting on my sock and my shoe, working on not dragging L foot when walking or on the stairs, work on being able to safely walk down the stairs" "to walk without the brace"  OBJECTIVE:   DIAGNOSTIC FINDINGS: ***  COGNITION: Overall cognitive status: Within functional limits for tasks assessed   SENSATION: {sensation:27233}  COORDINATION: ***  EDEMA:  {edema:24020}  MUSCLE TONE: {LE tone:25568}  MUSCLE LENGTH: Hamstrings: Right *** deg; Left *** deg Marcello Moores test: Right *** deg; Left *** deg  DTRs:  {DTR SITE:24025}  POSTURE: rounded shoulders, forward head, and posterior pelvic tilt  LOWER EXTREMITY ROM:     {AROM/PROM:27142}  Right Eval Left Eval  Hip flexion    Hip extension    Hip abduction    Hip adduction    Hip internal rotation    Hip external rotation    Knee flexion    Knee extension    Ankle dorsiflexion    Ankle plantarflexion    Ankle inversion    Ankle eversion     (Blank rows = not tested)  LOWER EXTREMITY MMT:    MMT Right Eval Left Eval  Hip flexion 5 2  Hip extension    Hip  abduction    Hip adduction    Hip internal rotation    Hip external rotation    Knee flexion 5 2  Knee extension 5 2  Ankle dorsiflexion 5 0  Ankle plantarflexion    Ankle inversion    Ankle eversion    (Blank rows = not tested)  BED MOBILITY:  Sit to supine Min A Supine to sit Min A Rolling to Right Modified independence Rolling to Left Mod A  Per pt report, his family or his care aide assist him with bed mobility.  TRANSFERS: Assistive device utilized: Programmer, multimedia  Sit to stand: Modified independence Stand to sit: Modified independence Chair to chair: Modified independence Floor:  not assessed at eval  GAIT: Gait pattern:  LLE ER, decreased step length- Left, decreased hip/knee flexion- Left, decreased ankle dorsiflexion- Left, and abducted- Left Distance walked: various clinic distances Assistive device utilized: Quad cane large base Level of assistance: Modified independence Comments: decreased gait speed, fatigues quickly  FUNCTIONAL TESTS:    [facility] PT Assessment - 01/26/23 1559       Ambulation/Gait   Gait velocity 32.8 ft over 46.69 sec = 0.7 ft/sec   with LBQC and CGA     Standardized Balance Assessment   Standardized Balance Assessment Five Times Sit to Stand;Timed Up and Go Test    Five times sit to stand comments  29.47 sec   pushing with RUE from  arm of chair     Timed Up and Go Test   TUG Normal TUG    Normal TUG (seconds) 37.85   with LBQC and CGA            TODAY'S TREATMENT:                                                                                                                              PT Evaluation    PATIENT EDUCATION: Education details: *** Person educated: {Person educated:25204} Education method: {Education Method:25205} Education comprehension: {Education Comprehension:25206}  HOME EXERCISE PROGRAM: ***  GOALS: Goals reviewed with patient? {yes/no:20286}  SHORT TERM GOALS: Target date:  ***  *** Baseline: Goal status: {GOALSTATUS:25110}  2.  *** Baseline:  Goal status: {GOALSTATUS:25110}  3.  *** Baseline:  Goal status: {GOALSTATUS:25110}  4.  *** Baseline:  Goal status: {GOALSTATUS:25110}  5.  *** Baseline:  Goal status: {GOALSTATUS:25110}  6.  *** Baseline:  Goal status: {GOALSTATUS:25110}  LONG TERM GOALS: Target date: ***  *** Baseline:  Goal status: {GOALSTATUS:25110}  2.  *** Baseline:  Goal status: {GOALSTATUS:25110}  3.  *** Baseline:  Goal status: {GOALSTATUS:25110}  4.  *** Baseline:  Goal status: {GOALSTATUS:25110}  5.  *** Baseline:  Goal status: {GOALSTATUS:25110}  6.  *** Baseline:  Goal status: {GOALSTATUS:25110}  ASSESSMENT:  CLINICAL IMPRESSION: Patient is a *** year old *** referred to Neuro OPPT for***.   Pt's PMH is significant for: *** The following deficits were present during the exam: ***. Based on ***, pt is an incr risk for falls. Pt would benefit from skilled PT to address these impairments and functional limitations to maximize functional mobility independence   OBJECTIVE IMPAIRMENTS: {opptimpairments:25111}.   ACTIVITY LIMITATIONS: {activitylimitations:27494}  PARTICIPATION LIMITATIONS: {participationrestrictions:25113}  PERSONAL FACTORS: {Personal factors:25162} are also affecting patient's functional outcome.   REHAB POTENTIAL: {rehabpotential:25112}  CLINICAL DECISION MAKING: {clinical decision making:25114}  EVALUATION COMPLEXITY: {Evaluation complexity:25115}  PLAN:  PT FREQUENCY: {rehab frequency:25116}  PT DURATION: {rehab duration:25117}  PLANNED INTERVENTIONS: {rehab planned interventions:25118::"Therapeutic exercises","Therapeutic activity","Neuromuscular re-education","Balance training","Gait training","Patient/Family education","Self Care","Joint mobilization"}  PLAN FOR NEXT SESSION: ***what has he been workin gon at home? (HEP), assess gait without and with AFO to show him how  unsafe he would be without AFO   Excell Seltzer, PT, DPT, CSRS 01/26/2023, 4:16 PM

## 2023-01-30 ENCOUNTER — Ambulatory Visit: Payer: Medicare Other | Admitting: Physical Therapy

## 2023-01-30 VITALS — BP 122/85 | HR 79

## 2023-01-30 DIAGNOSIS — R2681 Unsteadiness on feet: Secondary | ICD-10-CM

## 2023-01-30 DIAGNOSIS — M6281 Muscle weakness (generalized): Secondary | ICD-10-CM

## 2023-01-30 DIAGNOSIS — R2689 Other abnormalities of gait and mobility: Secondary | ICD-10-CM

## 2023-01-30 DIAGNOSIS — R293 Abnormal posture: Secondary | ICD-10-CM

## 2023-01-30 NOTE — Therapy (Signed)
OUTPATIENT PHYSICAL THERAPY NEURO TREATMENT   Patient Name: Matthew Black MRN: [id number] DOB:1976/04/25, 47 y.o., male 28 Date: 01/30/2023   PCP: Mackie Pai, PA-C REFERRING PROVIDER: Izora Ribas, MD  END OF SESSION:  PT End of Session - 01/30/23 1403     Visit Number 2    Number of Visits 13   with eval   Date for PT Re-Evaluation 03/23/23    Authorization Type Medicare    PT Start Time 1357    PT Stop Time 1450    PT Time Calculation (min) 53 min    Equipment Utilized During Treatment Gait belt    Activity Tolerance Patient tolerated treatment well;Patient limited by fatigue    Behavior During Therapy [facility] for tasks assessed/performed              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   Depression    DVT (deep venous thrombosis) ([place])    BLE DVT 07/21/19, 08/02/19; s/p retrievable IVC filter 07/21/19   Hemorrhagic stroke ([place])    History of kidney stones    Hypertension    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   PE (pulmonary thromboembolism) ([place])    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   Stroke ([place])    RICA, R A1, R MCA occlusion 07/19/19   Past Surgical History:  Procedure Laterality Date   CRANIOPLASTY Right 06/05/2020   Procedure: CRANIOPLASTY;  Surgeon: Consuella Lose, MD;  Location: [place];  Service: Neurosurgery;  Laterality: Right;  right   CRANIOTOMY Right 07/19/2019   Procedure: RIGHT HEMI-CRANIECTOMY With implantation of skull flap to abdominal wall;  Surgeon: Consuella Lose, MD;  Location: [place];  Service: Neurosurgery;  Laterality: Right;   CYST EXCISION N/A 10/08/2016   Procedure: EXCISION OF POSTERIOR NECK CYST;  Surgeon: Clovis Riley, MD;  [facility];  Service: General;  Laterality: N/A;   CYSTOSCOPY/URETEROSCOPY/HOLMIUM LASER/STENT PLACEMENT Right 03/16/2020   Procedure: CYSTOSCOPY RIGHT RETROGRADE PYELOGRAM URETEROSCOPY/HOLMIUM LASER/STENT PLACEMENT;  Surgeon:  Lucas Mallow, MD;  [facility];  Service: Urology;  Laterality: Right;   INCISION AND DRAINAGE ABSCESS N/A 09/22/2014   Procedure: INCISION AND DRAINAGE ABSCESS POSTERIOR NECK;  Surgeon: Pedro Earls, MD;  [facility];  Service: General;  Laterality: N/A;   INCISION AND DRAINAGE ABSCESS N/A 12/20/2015   Procedure: INCISION AND DRAINAGE POSTERIOR NECK MASS;  Surgeon: Armandina Gemma, MD;  [facility];  Service: General;  Laterality: N/A;   INCISION AND DRAINAGE ABSCESS Left 07/10/2004   middle finger   IR IVC FILTER PLMT / S&I /IMG GUID/MOD SED  07/21/2019   IR RADIOLOGIST EVAL & MGMT  12/14/2019   IR RADIOLOGIST EVAL & MGMT  12/21/2020   IR VENOGRAM RENAL UNI RIGHT  07/21/2019   MASS EXCISION N/A 07/21/2017   Procedure: EXCISION OF BENIGN NECK LESION WITH LAYERED CLOSURE;  Surgeon: Irene Limbo, MD;  Location: [place];  Service: Plastics;  Laterality: N/A;   MASS EXCISION N/A 11/10/2017   Procedure: EXCISION BENIGN LESION OF THE NECK WITH LAYERED CLOSURE;  Surgeon: Irene Limbo, MD;  Location: [place];  Service: Plastics;  Laterality: N/A;   Patient Active Problem List   Diagnosis Date Noted   COVID-19 virus infection 04/21/2021   Left-sided weakness 04/21/2021   S/P craniotomy 06/05/2020   History of cranioplasty 06/05/2020   Peri-rectal abscess 02/14/2020   Abnormal CT scan, pelvis 02/14/2020  Pancytopenia ([place]) 12/02/2019   Nephrolithiasis 12/02/2019   Hydronephrosis with renal and ureteral calculus obstruction 12/02/2019   Rectal pain 12/02/2019   Hyperkalemia 12/02/2019   Reactive depression    Wound infection after surgery    Sleep disturbance    Dysphagia, post-stroke    Transaminitis    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   Cerebral abscess    Urinary tract infection without hematuria    Altered mental status    Primary hypercoagulable state ([place])    Acute pulmonary embolism without acute cor  pulmonale (HCC)    Deep vein thrombosis (DVT) of non-extremity vein    Hypokalemia    Acute blood loss anemia    Leukocytosis    Endotracheal tube present    Acute respiratory failure with hypoxemia (HCC)    Stroke (cerebrum) ([place]) 07/19/2019   Pressure injury of skin 07/19/2019   Acute CVA (cerebrovascular accident) ([place])    Encephalopathy    Dysphagia    Acute encephalopathy    Essential hypertension    Obesity 03/12/2016   Scalp abscess 12/20/2015   Neck abscess 12/20/2015   Pilonidal cyst 02/08/2013    ONSET DATE: 01/21/2023  REFERRING DIAG: R25.2 (ICD-10-CM) - Spasticity I69.352 (ICD-10-CM) - 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 Pt reports that as far as HEP he has been working on walking and stretching.   Pt accompanied by: self  PERTINENT HISTORY: R CVA s/p hemicaniectomy 07/19/19 with abdominal flap implant.  PMH: hx of seizures and headaches s/p CVA, hx of DVT with IVC filter placement . Pt s/p botox on 01/21/22.  PAIN:  Are you having pain? No  PRECAUTIONS: Fall  WEIGHT BEARING RESTRICTIONS: No  FALLS: Has patient fallen in last 6 months? Yes. Number of falls 2; had to call fire department to get back up, didn't get hurt; one time I was in the wheelchair and I had a sock on and when I leaned over to reach for something I just slid out onto the floor; another time getting out of truck and running board was wet so slid onto the ground  LIVING  ENVIRONMENT: Lives with: lives with their spouse Lives in: House/apartment Stairs: Yes: Internal: 12 steps; on right going up and External: 3 steps; bilateral but cannot reach both Has following equipment at home: Quad cane large base, Wheelchair (manual), and bed side commode  Tried going up stairs inside home about 8 months ago and fell down the stairs, stairs are carpeted.  PLOF: Independent with gait, Independent with transfers, and Requires assistive device for independence  PATIENT GOALS: "putting on my sock and my shoe myself, working on not dragging my L foot when walking or when on the stairs, work on being able to safely walk down the stairs" "to walk without the brace"  OBJECTIVE:   DIAGNOSTIC FINDINGS:   Brain MRI 04/21/2021 IMPRESSION: 1. No emergent finding. 2. Sequela of large remote right MCA territory infarct.  COGNITION: Overall cognitive status: Within functional limits for tasks assessed   SENSATION: Decreased in L hemibody  POSTURE: rounded shoulders, forward head, and posterior pelvic tilt  VITALS: Vitals:   01/30/23 1411  BP: 122/85  Pulse: 79   BP assessed in sitting at beginning of therapy session   TODAY'S TREATMENT:                                                                                                                              TherEx Supine hamstring stretch with gait belt Encouraged pt to perform long-sit hamstring stretch with his strap at home while in hospital bed  Supine SKTC PROM for hip stretch, pt unable to perform independently Pt hesitant about trying a Thomas stretch at [employer] due to fear of falling  Added seated/long-sit HS stretch to HEP and PROM SKTC to HEP, see bolded below  NMR Staggered sit stand EOM with RLE placed forwards on dycem. Pt unable to maintain RLE in place on dycem and/or leans so far anteriorly into his RLE with no evidence of WB through LLE during transfer. Attempted staggered sit to stand with RLE  placed on 2" step, pt again unable to maintain WB through LLE with sit to stand transfer and leans into his RLE to stand. Use of mirror in front of patient for visual feedback to maintain midline with transfer, however pt unable to.   PATIENT EDUCATION: Education details: initiated HEP, possible change in PT schedule to accommodate aquatic PT Person educated: Patient Education method: Customer service manager Education comprehension: verbalized understanding and needs further education  HOME EXERCISE PROGRAM: Access Code: [email or web address] URL: [email or web address] Date: 01/30/2023 Prepared by: Excell Seltzer  Exercises - Seated Hamstring Stretch with Strap  - 1 x daily - 7 x weekly - 1 sets - 10 reps - 30 sec hold - Hooklying Single Knee to Chest Stretch  - 1 x daily - 7 x weekly - 1 sets - 10 reps - 30 sec hold   GOALS: Goals reviewed with patient? Yes  SHORT TERM GOALS=LONG TERM GOALS due to length of POC   LONG TERM GOALS:  Target date: 02/25/2023  Pt will be independent with final HEP for improved strength, balance, transfers and gait. Baseline:  Goal status: INITIAL  2.  Pt will improve 5 x STS to less than or equal to 25 seconds to demonstrate improved functional strength and transfer efficiency.  Baseline: 29.47 sec (3/4) Goal status: INITIAL  3.  Pt will improve gait velocity to at least 1.0 ft/sec for improved gait efficiency and performance at mod I level  Baseline: 0.7 ft/sec with LBQC (3/4) Goal status: INITIAL  4.  Pt will improve normal TUG to less than or equal to 32 seconds for improved functional mobility and decreased fall risk. Baseline: 37.85 sec with LBQC (3/4) Goal status: INITIAL   ASSESSMENT:  CLINICAL IMPRESSION: Emphasis of skilled PT session on initiating HEP for LLE stretching and working on NMR of LLE. Pt with very flat affect during majority of session then by end of session reports feeling very discouraged and like a "failure"  due to significant difficulty with staggered sit to stand task. Provided emotional support and encouragement to patient. Pt continues to benefit from skilled therapy services due to L hemibody weakness due to chronic CVA leading to impaired gait and impaired balance. Continue POC.   OBJECTIVE IMPAIRMENTS: Abnormal gait, decreased balance, decreased coordination, decreased endurance, decreased mobility, difficulty walking, decreased ROM, decreased strength, impaired perceived functional ability, impaired flexibility, impaired tone, and impaired UE functional use.   ACTIVITY LIMITATIONS: carrying, lifting, bending, squatting, stairs, bed mobility, bathing, dressing, reach over head, and hygiene/grooming  PARTICIPATION LIMITATIONS: meal prep, cleaning, laundry, and community activity  PERSONAL FACTORS: Time since onset of injury/illness/exacerbation and 1-2 comorbidities:    R CVA s/p hemicaniectomy 07/19/19 with abdominal flap implant.  PMH: hx of seizures and headaches s/p CVA, hx of DVT with IVC filter placement are also affecting patient's functional outcome.   REHAB POTENTIAL: Fair time since onset, chronic CVA  CLINICAL DECISION MAKING: Stable/uncomplicated  EVALUATION COMPLEXITY: Low  PLAN:  PT FREQUENCY: 2x/week  PT DURATION: 6 weeks  PLANNED INTERVENTIONS: Therapeutic exercises, Therapeutic activity, Neuromuscular re-education, Balance training, Gait training, Patient/Family education, Self Care, Joint mobilization, Stair training, Visual/preceptual remediation/compensation, Orthotic/Fit training, DME instructions, Aquatic Therapy, Dry Needling, Electrical stimulation, Wheelchair mobility training, Cryotherapy, Moist heat, Taping, Manual therapy, and Re-evaluation  PLAN FOR NEXT SESSION: how is initial HEP? Add to HEP PRN. assess gait without and with AFO to show him how unsafe he would be without AFO. Does pt want to do aquatic PT 1x/week? Pt needs lots of encouragement, does not like  to "fail"   Excell Seltzer, PT, DPT, CSRS 01/30/2023, 2:51 PM

## 2023-02-02 ENCOUNTER — Ambulatory Visit: Payer: Medicare Other | Admitting: Occupational Therapy

## 2023-02-03 ENCOUNTER — Ambulatory Visit: Payer: Medicare Other | Admitting: Physical Therapy

## 2023-02-03 DIAGNOSIS — R2681 Unsteadiness on feet: Secondary | ICD-10-CM

## 2023-02-03 DIAGNOSIS — I69354 Hemiplegia and hemiparesis following cerebral infarction affecting left non-dominant side: Secondary | ICD-10-CM

## 2023-02-03 DIAGNOSIS — R2689 Other abnormalities of gait and mobility: Secondary | ICD-10-CM

## 2023-02-03 NOTE — Therapy (Signed)
OUTPATIENT PHYSICAL THERAPY NEURO TREATMENT   Patient Name: Matthew Black MRN: [id number] DOB:1976/06/20, 47 y.o., male 17 Date: 02/03/2023   PCP: Mackie Pai, PA-C REFERRING PROVIDER: Izora Ribas, MD  END OF SESSION:  PT End of Session - 02/03/23 1452     Visit Number 3    Number of Visits 13   with eval   Date for PT Re-Evaluation 03/23/23    Authorization Type Medicare    PT Start Time 1448    PT Stop Time [id number]    PT Time Calculation (min) 42 min    Equipment Utilized During Treatment Gait belt    Activity Tolerance Patient tolerated treatment well;Patient limited by fatigue    Behavior During Therapy [facility] for tasks assessed/performed               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   Depression    DVT (deep venous thrombosis) ([place])    BLE DVT 07/21/19, 08/02/19; s/p retrievable IVC filter 07/21/19   Hemorrhagic stroke ([place])    History of kidney stones    Hypertension    Paralysis ([place])    LEFT SIDE   PE (pulmonary thromboembolism) ([place])    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   Stroke ([place])    RICA, R A1, R MCA occlusion 07/19/19   Past Surgical History:  Procedure Laterality Date   CRANIOPLASTY Right 06/05/2020   Procedure: CRANIOPLASTY;  Surgeon: Consuella Lose, MD;  Location: [place];  Service: Neurosurgery;  Laterality: Right;  right   CRANIOTOMY Right 07/19/2019   Procedure: RIGHT HEMI-CRANIECTOMY With implantation of skull flap to abdominal wall;  Surgeon: Consuella Lose, MD;  Location: [place];  Service: Neurosurgery;  Laterality: Right;   CYST EXCISION N/A 10/08/2016   Procedure: EXCISION OF POSTERIOR NECK CYST;  Surgeon: Clovis Riley, MD;  Location: [facility];  Service: General;  Laterality: N/A;   CYSTOSCOPY/URETEROSCOPY/HOLMIUM LASER/STENT PLACEMENT Right 03/16/2020   Procedure: CYSTOSCOPY RIGHT RETROGRADE PYELOGRAM URETEROSCOPY/HOLMIUM LASER/STENT PLACEMENT;   Surgeon: Lucas Mallow, MD;  Location: [facility];  Service: Urology;  Laterality: Right;   INCISION AND DRAINAGE ABSCESS N/A 09/22/2014   Procedure: INCISION AND DRAINAGE ABSCESS POSTERIOR NECK;  Surgeon: Pedro Earls, MD;  Location: [facility];  Service: General;  Laterality: N/A;   INCISION AND DRAINAGE ABSCESS N/A 12/20/2015   Procedure: INCISION AND DRAINAGE POSTERIOR NECK MASS;  Surgeon: Armandina Gemma, MD;  Location: [facility];  Service: General;  Laterality: N/A;   INCISION AND DRAINAGE ABSCESS Left 07/10/2004   middle finger   IR IVC FILTER PLMT / S&I /IMG GUID/MOD SED  07/21/2019   IR RADIOLOGIST EVAL & MGMT  12/14/2019   IR RADIOLOGIST EVAL & MGMT  12/21/2020   IR VENOGRAM RENAL UNI RIGHT  07/21/2019   MASS EXCISION N/A 07/21/2017   Procedure: EXCISION OF BENIGN NECK LESION WITH LAYERED CLOSURE;  Surgeon: Irene Limbo, MD;  Location: [place];  Service: Plastics;  Laterality: N/A;   MASS EXCISION N/A 11/10/2017   Procedure: EXCISION BENIGN LESION OF THE NECK WITH LAYERED CLOSURE;  Surgeon: Irene Limbo, MD;  Location: [place];  Service: Plastics;  Laterality: N/A;   Patient Active Problem List   Diagnosis Date Noted   COVID-19 virus infection 04/21/2021   Left-sided weakness 04/21/2021   S/P craniotomy 06/05/2020   History of cranioplasty 06/05/2020   Peri-rectal abscess 02/14/2020   Abnormal CT scan, pelvis  02/14/2020   Pancytopenia ([place]) 12/02/2019   Nephrolithiasis 12/02/2019   Hydronephrosis with renal and ureteral calculus obstruction 12/02/2019   Rectal pain 12/02/2019   Hyperkalemia 12/02/2019   Reactive depression    Wound infection after surgery    Sleep disturbance    Dysphagia, post-stroke    Transaminitis    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   Cerebral abscess    Urinary tract infection without hematuria    Altered mental status    Primary hypercoagulable state ([place])    Acute pulmonary embolism without  acute cor pulmonale (HCC)    Deep vein thrombosis (DVT) of non-extremity vein    Hypokalemia    Acute blood loss anemia    Leukocytosis    Endotracheal tube present    Acute respiratory failure with hypoxemia (HCC)    Stroke (cerebrum) ([place]) 07/19/2019   Pressure injury of skin 07/19/2019   Acute CVA (cerebrovascular accident) ([place])    Encephalopathy    Dysphagia    Acute encephalopathy    Essential hypertension    Obesity 03/12/2016   Scalp abscess 12/20/2015   Neck abscess 12/20/2015   Pilonidal cyst 02/08/2013    ONSET DATE: 01/21/2023  REFERRING DIAG: R25.2 (ICD-10-CM) - Spasticity I69.352 (ICD-10-CM) - 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 Inquiring whether his sit <>stand was "good enough" in waiting room, requiring max encouragement to ambulate into treatment room. States he was been working on "Not moving his right leg". Denies acute changes   Pt accompanied by: self  PERTINENT HISTORY: R CVA s/p hemicaniectomy 07/19/19 with abdominal flap implant.  PMH: hx of seizures and headaches s/p CVA, hx of DVT with IVC filter placement . Pt s/p botox on 01/21/22.  PAIN:  Are you having pain? No  PRECAUTIONS: Fall  WEIGHT BEARING RESTRICTIONS: No  FALLS: Has patient fallen in last 6 months? Yes. Number of falls 2; had to call fire department to get back up, didn't get hurt; one time I was in the wheelchair and I had a sock on and when I leaned over  to reach for something I just slid  out onto the floor; another time getting out of truck and running board was wet so slid onto the ground  LIVING ENVIRONMENT: Lives with: lives with their spouse Lives in: House/apartment Stairs: Yes: Internal: 12 steps; on right going up and External: 3 steps; bilateral but cannot reach both Has following equipment at home: Quad cane large base, Wheelchair (manual), and bed side commode  Tried going up stairs inside home about 8 months ago and fell down the stairs, stairs are carpeted.  PLOF: Independent with gait, Independent with transfers, and Requires assistive device for independence  PATIENT GOALS: "putting on my sock and my shoe myself, working on not dragging my L foot when walking or when on the stairs, work on being able to safely walk down the stairs" "to walk without the brace"  OBJECTIVE:   DIAGNOSTIC FINDINGS:   Brain MRI 04/21/2021 IMPRESSION: 1. No emergent finding. 2. Sequela of large remote right MCA territory infarct.  COGNITION: Overall cognitive status: Within functional limits for tasks assessed   SENSATION: Decreased in L hemibody  POSTURE: rounded shoulders, forward head, and posterior pelvic tilt  VITALS: There were no vitals filed for this visit.  BP assessed in sitting at beginning of therapy session   TODAY'S TREATMENT:                                                                                                                               TherEx SciFit multi-peaks level 4 for 8 minutes using BLEs for neural priming for reciprocal movement, dynamic cardiovascular warmup and BLE strength. Pt only able to tolerate 6.5 mins of activity due to fatigue and L knee pain. RPE of 8/10 following activity.   NMR  In // bars for improved weight acceptance on LLE: Toe taps to 2" step w/RLE and RUE support on rail, x3 reps w/max A for postural control and balance, as pt leaning HEAVY to R side despite max cues. Pt then  became "upset" at a comment made by front desk staff regarding aquatic therapy appointment day prior and requested to sit down. Pt required max encouraging cues to continue session. Once agreeable, pt performed 10 reps w/HEAVY lean to R side despite max multimodal cues from therapist. Pt then requested seated rest break and slapped himself stating "get out of my feelings". After final rest break, provided visual cues to show pt proper movement and pt able to perform final 6 reps well w/significant improvement in weight shift to L side. On final rep, pt's L knee buckled and startled pt, but provided max encouraging feedback and pt satisfied with session.    Gait pattern: step to pattern, decreased step length- Right, decreased stance time- Left, decreased stride length, decreased hip/knee flexion- Left, decreased ankle dorsiflexion- Left, lateral lean- Right, and poor foot clearance- Left Distance walked: Various clinic distances  Assistive device utilized: Quad cane large base and Solid AFO on L side  Level of assistance: SBA and  CGA Comments: Pt continues to demonstrate poor weight shift to L side and compensation w/R truncal lean. Noted significant tremor in LLE at end of session due to fatigue, so escorted pt out of clinic.   RPE of 6/10 following session   PATIENT EDUCATION: Education details: Continue HEP, possible change in PT schedule to accommodate aquatic PT Person educated: Patient Education method: Customer service manager Education comprehension: verbalized understanding and needs further education  HOME EXERCISE PROGRAM: Access Code: [email or web address] URL: [email or web address] Date: 01/30/2023 Prepared by: Excell Seltzer  Exercises - Seated Hamstring Stretch with Strap  - 1 x daily - 7 x weekly - 1 sets - 10 reps - 30 sec hold - Hooklying Single Knee to Chest Stretch  - 1 x daily - 7 x weekly - 1 sets - 10 reps - 30 sec hold   GOALS: Goals reviewed with patient?  Yes  SHORT TERM GOALS=LONG TERM GOALS due to length of POC   LONG TERM GOALS: Target date: 02/25/2023  Pt will be independent with final HEP for improved strength, balance, transfers and gait. Baseline:  Goal status: INITIAL  2.  Pt will improve 5 x STS to less than or equal to 25 seconds to demonstrate improved functional strength and transfer efficiency.  Baseline: 29.47 sec (3/4) Goal status: INITIAL  3.  Pt will improve gait velocity to at least 1.0 ft/sec for improved gait efficiency and performance at mod I level  Baseline: 0.7 ft/sec with [facility] (3/4) Goal status: INITIAL  4.  Pt will improve normal TUG to less than or equal to 32 seconds for improved functional mobility and decreased fall risk. Baseline: 37.85 sec with [facility] (3/4) Goal status: INITIAL   ASSESSMENT:  CLINICAL IMPRESSION: Emphasis of skilled PT session on endurance and facilitating weight shift to L side. Pt very self-limiting throughout session and became emotional at comment made by front desk staff, requiring max encouraging cues to continue session. Pt required max multimodal cues to weight shift to L side during session and responded well to visual cues via therapist demonstration rather than visual biofeedback on body position. Continue POC.    OBJECTIVE IMPAIRMENTS: Abnormal gait, decreased balance, decreased coordination, decreased endurance, decreased mobility, difficulty walking, decreased ROM, decreased strength, impaired perceived functional ability, impaired flexibility, impaired tone, and impaired UE functional use.   ACTIVITY LIMITATIONS: carrying, lifting, bending, squatting, stairs, bed mobility, bathing, dressing, reach over head, and hygiene/grooming  PARTICIPATION LIMITATIONS: meal prep, cleaning, laundry, and community activity  PERSONAL FACTORS: Time since onset of injury/illness/exacerbation and 1-2 comorbidities:    R CVA s/p hemicaniectomy 07/19/19 with abdominal flap implant.  PMH: hx of  seizures and headaches s/p CVA, hx of DVT with IVC filter placement are also affecting patient's functional outcome.   REHAB POTENTIAL: Fair time since onset, chronic CVA  CLINICAL DECISION MAKING: Stable/uncomplicated  EVALUATION COMPLEXITY: Low  PLAN:  PT FREQUENCY: 2x/week  PT DURATION: 6 weeks  PLANNED INTERVENTIONS: Therapeutic exercises, Therapeutic activity, Neuromuscular re-education, Balance training, Gait training, Patient/Family education, Self Care, Joint mobilization, Stair training, Visual/preceptual remediation/compensation, Orthotic/Fit training, DME instructions, Aquatic Therapy, Dry Needling, Electrical stimulation, Wheelchair mobility training, Cryotherapy, Moist heat, Taping, Manual therapy, and Re-evaluation  PLAN FOR NEXT SESSION: how is initial HEP? Add to HEP PRN. assess gait without and with AFO to show him how unsafe he would be without AFO. Does pt want to do aquatic PT 1x/week? Pt needs lots of encouragement, does not like to "fail"   Cruzita Lederer Daryl Quiros, PT, DPT  02/03/2023, 3:33 PM

## 2023-02-06 ENCOUNTER — Ambulatory Visit: Payer: Medicare Other | Admitting: Physical Therapy

## 2023-02-06 DIAGNOSIS — R2689 Other abnormalities of gait and mobility: Secondary | ICD-10-CM

## 2023-02-06 DIAGNOSIS — R2681 Unsteadiness on feet: Secondary | ICD-10-CM

## 2023-02-06 DIAGNOSIS — R293 Abnormal posture: Secondary | ICD-10-CM

## 2023-02-06 DIAGNOSIS — M6281 Muscle weakness (generalized): Secondary | ICD-10-CM

## 2023-02-06 DIAGNOSIS — I69354 Hemiplegia and hemiparesis following cerebral infarction affecting left non-dominant side: Secondary | ICD-10-CM

## 2023-02-06 NOTE — Therapy (Signed)
OUTPATIENT PHYSICAL THERAPY NEURO TREATMENT   Patient Name: Matthew Black MRN: [id number] DOB:01-25-1976, 47 y.o., male 72 Date: 02/06/2023   PCP: Mackie Pai, PA-C REFERRING PROVIDER: Izora Ribas, MD  END OF SESSION:  PT End of Session - 02/06/23 1404     Visit Number 4    Number of Visits 13   with eval   Date for PT Re-Evaluation 03/23/23    Authorization Type Medicare    PT Start Time 1359    PT Stop Time [id number]    PT Time Calculation (min) 46 min    Equipment Utilized During Treatment Gait belt    Activity Tolerance Patient tolerated treatment well;Patient limited by fatigue    Behavior During Therapy [facility] for tasks assessed/performed                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   Depression    DVT (deep venous thrombosis) ([place])    BLE DVT 07/21/19, 08/02/19; s/p retrievable IVC filter 07/21/19   Hemorrhagic stroke ([place])    History of kidney stones    Hypertension    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   PE (pulmonary thromboembolism) ([place])    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   Stroke ([place])    RICA, R A1, R MCA occlusion 07/19/19   Past Surgical History:  Procedure Laterality Date   CRANIOPLASTY Right 06/05/2020   Procedure: CRANIOPLASTY;  Surgeon: Consuella Lose, MD;  Location: [place];  Service: Neurosurgery;  Laterality: Right;  right   CRANIOTOMY Right 07/19/2019   Procedure: RIGHT HEMI-CRANIECTOMY With implantation of skull flap to abdominal wall;  Surgeon: Consuella Lose, MD;  Location: [place];  Service: Neurosurgery;  Laterality: Right;   CYST EXCISION N/A 10/08/2016   Procedure: EXCISION OF POSTERIOR NECK CYST;  Surgeon: Clovis Riley, MD;  [facility];  Service: General;  Laterality: N/A;   CYSTOSCOPY/URETEROSCOPY/HOLMIUM LASER/STENT PLACEMENT Right 03/16/2020   Procedure: CYSTOSCOPY RIGHT RETROGRADE PYELOGRAM URETEROSCOPY/HOLMIUM LASER/STENT PLACEMENT;   Surgeon: Lucas Mallow, MD;  [facility];  Service: Urology;  Laterality: Right;   INCISION AND DRAINAGE ABSCESS N/A 09/22/2014   Procedure: INCISION AND DRAINAGE ABSCESS POSTERIOR NECK;  Surgeon: Pedro Earls, MD;  [facility];  Service: General;  Laterality: N/A;   INCISION AND DRAINAGE ABSCESS N/A 12/20/2015   Procedure: INCISION AND DRAINAGE POSTERIOR NECK MASS;  Surgeon: Armandina Gemma, MD;  [facility];  Service: General;  Laterality: N/A;   INCISION AND DRAINAGE ABSCESS Left 07/10/2004   middle finger   IR IVC FILTER PLMT / S&I /IMG GUID/MOD SED  07/21/2019   IR RADIOLOGIST EVAL & MGMT  12/14/2019   IR RADIOLOGIST EVAL & MGMT  12/21/2020   IR VENOGRAM RENAL UNI RIGHT  07/21/2019   MASS EXCISION N/A 07/21/2017   Procedure: EXCISION OF BENIGN NECK LESION WITH LAYERED CLOSURE;  Surgeon: Irene Limbo, MD;  Location: [place];  Service: Plastics;  Laterality: N/A;   MASS EXCISION N/A 11/10/2017   Procedure: EXCISION BENIGN LESION OF THE NECK WITH LAYERED CLOSURE;  Surgeon: Irene Limbo, MD;  Location: [place];  Service: Plastics;  Laterality: N/A;   Patient Active Problem List   Diagnosis Date Noted   COVID-19 virus infection 04/21/2021   Left-sided weakness 04/21/2021   S/P craniotomy 06/05/2020   History of cranioplasty 06/05/2020   Peri-rectal abscess 02/14/2020   Abnormal CT scan,  pelvis 02/14/2020   Pancytopenia ([place]) 12/02/2019   Nephrolithiasis 12/02/2019   Hydronephrosis with renal and ureteral calculus obstruction 12/02/2019   Rectal pain 12/02/2019   Hyperkalemia 12/02/2019   Reactive depression    Wound infection after surgery    Sleep disturbance    Dysphagia, post-stroke    Transaminitis    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   Cerebral abscess    Urinary tract infection without hematuria    Altered mental status    Primary hypercoagulable state ([place])    Acute pulmonary embolism without  acute cor pulmonale (HCC)    Deep vein thrombosis (DVT) of non-extremity vein    Hypokalemia    Acute blood loss anemia    Leukocytosis    Endotracheal tube present    Acute respiratory failure with hypoxemia (HCC)    Stroke (cerebrum) ([place]) 07/19/2019   Pressure injury of skin 07/19/2019   Acute CVA (cerebrovascular accident) ([place])    Encephalopathy    Dysphagia    Acute encephalopathy    Essential hypertension    Obesity 03/12/2016   Scalp abscess 12/20/2015   Neck abscess 12/20/2015   Pilonidal cyst 02/08/2013    ONSET DATE: 01/21/2023  REFERRING DIAG: R25.2 (ICD-10-CM) - Spasticity I69.352 (ICD-10-CM) - 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 (HCC)  THERAPY DIAG:  Unsteadiness on feet  Hemiplegia and hemiparesis following cerebral infarction affecting left non-dominant side (HCC)  Other abnormalities of gait and mobility  Muscle weakness (generalized)  Abnormal posture  Spastic hemiplegia of left nondominant side as late effect of cerebral infarction [facility])  Rationale for Evaluation and Treatment: Rehabilitation  SUBJECTIVE:                                                                                                                                                                                             SUBJECTIVE STATEMENT: Pt reports no falls and no acute changes since last visit. No complaints of pain today. Pt asking to review his HEP.  Pt accompanied by: self  PERTINENT HISTORY: R CVA s/p hemicaniectomy 07/19/19 with abdominal flap implant.  PMH: hx of seizures and headaches s/p CVA, hx of DVT with IVC filter placement . Pt s/p botox on 01/21/22.  PAIN:  Are you having pain? No  PRECAUTIONS: Fall  WEIGHT BEARING RESTRICTIONS: No  FALLS: Has patient fallen in last 6 months? Yes. Number of falls 2; had to call fire department to get back up, didn't get hurt; one time I was in the wheelchair and I had a sock on  and when I leaned over to reach  for something I just slid out onto the floor; another time getting out of truck and running board was wet so slid onto the ground  LIVING ENVIRONMENT: Lives with: lives with their spouse Lives in: House/apartment Stairs: Yes: Internal: 12 steps; on right going up and External: 3 steps; bilateral but cannot reach both Has following equipment at home: Quad cane large base, Wheelchair (manual), and bed side commode  Tried going up stairs inside home about 8 months ago and fell down the stairs, stairs are carpeted.  PLOF: Independent with gait, Independent with transfers, and Requires assistive device for independence  PATIENT GOALS: "putting on my sock and my shoe myself, working on not dragging my L foot when walking or when on the stairs, work on being able to safely walk down the stairs" "to walk without the brace"  OBJECTIVE:   DIAGNOSTIC FINDINGS:   Brain MRI 04/21/2021 IMPRESSION: 1. No emergent finding. 2. Sequela of large remote right MCA territory infarct.  COGNITION: Overall cognitive status: Within functional limits for tasks assessed   SENSATION: Decreased in L hemibody  POSTURE: rounded shoulders, forward head, and posterior pelvic tilt  VITALS: There were no vitals filed for this visit.  BP assessed in sitting at beginning of therapy session   TODAY'S TREATMENT:                                                                                                                               TherEx SciFit multi-peaks level 1 for 5 minutes using BLEs for neural priming for reciprocal movement, dynamic cardiovascular warmup and BLE strength. Utilized gait belt around thighs to keep L hip from abducting during exercise.  Adjusted seated HS stretch on HEP and deleted SKTC due to difficulty performing this at home, new handout provided  NMR  Standing EOM: Tried 4" step tap with RLE with SBQC and min A, posterior LOB Tried 2" step tap with  RLE with SBQC and min A, posterior LOB  Seated EOM: Tried LLE 2" step tap, difficulty lifting LLE high enough to clear step due to hip flexor weakness Tried LLE step overs, again with difficulty due to hip flexor weakness  Sit to stand with RLE on 2" step to encourage weight shift onto LLE with SBQC: Pt unable to perform sit to stand and keep LLE on the floor Once standing with RLE on step, encouraged weight shift to place LLE on floor, pt with difficulty shifting over to the L With tactile cueing to bring L hip to therapist hand pt exhibits improved weight shift x 2 reps, unable to maintain due to fatigue   PATIENT EDUCATION: Education details: adjusted HEP Person educated: Patient Education method: Explanation, Demonstration, Tactile cues, Verbal cues, and Handouts Education comprehension: verbalized understanding and needs further education  HOME EXERCISE PROGRAM: Access Code: [email or web address] URL: [email or web address] Date: 01/30/2023 Prepared by: Excell Seltzer  - Seated Hamstring Stretch  - 1 x daily - 7 x weekly -  1 sets - 10 reps - 30 sec hold   GOALS: Goals reviewed with patient? Yes  SHORT TERM GOALS=LONG TERM GOALS due to length of POC   LONG TERM GOALS: Target date: 02/25/2023  Pt will be independent with final HEP for improved strength, balance, transfers and gait. Baseline:  Goal status: INITIAL  2.  Pt will improve 5 x STS to less than or equal to 25 seconds to demonstrate improved functional strength and transfer efficiency.  Baseline: 29.47 sec (3/4) Goal status: INITIAL  3.  Pt will improve gait velocity to at least 1.0 ft/sec for improved gait efficiency and performance at mod I level  Baseline: 0.7 ft/sec with LBQC (3/4) Goal status: INITIAL  4.  Pt will improve normal TUG to less than or equal to 32 seconds for improved functional mobility and decreased fall risk. Baseline: 37.85 sec with LBQC (3/4) Goal status:  INITIAL   ASSESSMENT:  CLINICAL IMPRESSION: Emphasis of skilled PT session on continuing to work on standing weight shift to the L and increasing WB through limb in standing. Pt continues to exhibit significant difficulty with this but does exhibit improved performance as compared to previous sessions. Pt continues to benefit from skilled therapy services to address L hemibody weakness leading to impaired balance and gait mechanics and therefore increased fall risk. Continue POC.    OBJECTIVE IMPAIRMENTS: Abnormal gait, decreased balance, decreased coordination, decreased endurance, decreased mobility, difficulty walking, decreased ROM, decreased strength, impaired perceived functional ability, impaired flexibility, impaired tone, and impaired UE functional use.   ACTIVITY LIMITATIONS: carrying, lifting, bending, squatting, stairs, bed mobility, bathing, dressing, reach over head, and hygiene/grooming  PARTICIPATION LIMITATIONS: meal prep, cleaning, laundry, and community activity  PERSONAL FACTORS: Time since onset of injury/illness/exacerbation and 1-2 comorbidities:    R CVA s/p hemicaniectomy 07/19/19 with abdominal flap implant.  PMH: hx of seizures and headaches s/p CVA, hx of DVT with IVC filter placement are also affecting patient's functional outcome.   REHAB POTENTIAL: Fair time since onset, chronic CVA  CLINICAL DECISION MAKING: Stable/uncomplicated  EVALUATION COMPLEXITY: Low  PLAN:  PT FREQUENCY: 2x/week  PT DURATION: 6 weeks  PLANNED INTERVENTIONS: Therapeutic exercises, Therapeutic activity, Neuromuscular re-education, Balance training, Gait training, Patient/Family education, Self Care, Joint mobilization, Stair training, Visual/preceptual remediation/compensation, Orthotic/Fit training, DME instructions, Aquatic Therapy, Dry Needling, Electrical stimulation, Wheelchair mobility training, Cryotherapy, Moist heat, Taping, Manual therapy, and Re-evaluation  PLAN FOR NEXT  SESSION: how is HEP? Add to HEP PRN. assess gait without and with AFO to show him how unsafe he would be without AFO. Does pt want to do aquatic PT 1x/week? Pt needs lots of encouragement, does not like to "fail", continue to work on increasing weight shift to L side with standing tasks, LLE stretching as able   Excell Seltzer, PT, DPT, CSRS  02/06/2023, 2:46 PM

## 2023-02-09 ENCOUNTER — Ambulatory Visit: Payer: Self-pay | Admitting: Occupational Therapy

## 2023-02-10 ENCOUNTER — Ambulatory Visit: Payer: Medicare Other | Admitting: Physical Therapy

## 2023-02-10 DIAGNOSIS — M6281 Muscle weakness (generalized): Secondary | ICD-10-CM

## 2023-02-10 DIAGNOSIS — R293 Abnormal posture: Secondary | ICD-10-CM

## 2023-02-10 DIAGNOSIS — R2681 Unsteadiness on feet: Secondary | ICD-10-CM

## 2023-02-10 DIAGNOSIS — R2689 Other abnormalities of gait and mobility: Secondary | ICD-10-CM

## 2023-02-10 NOTE — Therapy (Signed)
OUTPATIENT PHYSICAL THERAPY NEURO TREATMENT   Patient Name: Matthew Black MRN: [id number] DOB:February 26, 1976, 47 y.o., male 50 Date: 02/10/2023   PCP: Mackie Pai, PA-C REFERRING PROVIDER: Izora Ribas, MD  END OF SESSION:  PT End of Session - 02/10/23 1415     Visit Number 5    Number of Visits 13   with eval   Date for PT Re-Evaluation 03/23/23    Authorization Type Medicare    PT Start Time 1410   pt arrived late   PT Stop Time 1450    PT Time Calculation (min) 40 min    Equipment Utilized During Treatment Gait belt    Activity Tolerance Patient tolerated treatment well;Patient limited by fatigue    Behavior During Therapy [facility] for tasks assessed/performed                 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   Depression    DVT (deep venous thrombosis) ([place])    BLE DVT 07/21/19, 08/02/19; s/p retrievable IVC filter 07/21/19   Hemorrhagic stroke ([place])    History of kidney stones    Hypertension    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   PE (pulmonary thromboembolism) ([place])    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   Stroke ([place])    RICA, R A1, R MCA occlusion 07/19/19   Past Surgical History:  Procedure Laterality Date   CRANIOPLASTY Right 06/05/2020   Procedure: CRANIOPLASTY;  Surgeon: Consuella Lose, MD;  Location: [place];  Service: Neurosurgery;  Laterality: Right;  right   CRANIOTOMY Right 07/19/2019   Procedure: RIGHT HEMI-CRANIECTOMY With implantation of skull flap to abdominal wall;  Surgeon: Consuella Lose, MD;  Location: [place];  Service: Neurosurgery;  Laterality: Right;   CYST EXCISION N/A 10/08/2016   Procedure: EXCISION OF POSTERIOR NECK CYST;  Surgeon: Clovis Riley, MD;  [facility];  Service: General;  Laterality: N/A;   CYSTOSCOPY/URETEROSCOPY/HOLMIUM LASER/STENT PLACEMENT Right 03/16/2020   Procedure: CYSTOSCOPY RIGHT RETROGRADE PYELOGRAM URETEROSCOPY/HOLMIUM  LASER/STENT PLACEMENT;  Surgeon: Lucas Mallow, MD;  [facility];  Service: Urology;  Laterality: Right;   INCISION AND DRAINAGE ABSCESS N/A 09/22/2014   Procedure: INCISION AND DRAINAGE ABSCESS POSTERIOR NECK;  Surgeon: Pedro Earls, MD;  [facility];  Service: General;  Laterality: N/A;   INCISION AND DRAINAGE ABSCESS N/A 12/20/2015   Procedure: INCISION AND DRAINAGE POSTERIOR NECK MASS;  Surgeon: Armandina Gemma, MD;  [facility];  Service: General;  Laterality: N/A;   INCISION AND DRAINAGE ABSCESS Left 07/10/2004   middle finger   IR IVC FILTER PLMT / S&I /IMG GUID/MOD SED  07/21/2019   IR RADIOLOGIST EVAL & MGMT  12/14/2019   IR RADIOLOGIST EVAL & MGMT  12/21/2020   IR VENOGRAM RENAL UNI RIGHT  07/21/2019   MASS EXCISION N/A 07/21/2017   Procedure: EXCISION OF BENIGN NECK LESION WITH LAYERED CLOSURE;  Surgeon: Irene Limbo, MD;  Location: [place];  Service: Plastics;  Laterality: N/A;   MASS EXCISION N/A 11/10/2017   Procedure: EXCISION BENIGN LESION OF THE NECK WITH LAYERED CLOSURE;  Surgeon: Irene Limbo, MD;  Location: [place];  Service: Plastics;  Laterality: N/A;   Patient Active Problem List   Diagnosis Date Noted   COVID-19 virus infection 04/21/2021   Left-sided weakness 04/21/2021   S/P craniotomy 06/05/2020   History of cranioplasty 06/05/2020   Peri-rectal abscess 02/14/2020  Abnormal CT scan, pelvis 02/14/2020   Pancytopenia ([place]) 12/02/2019   Nephrolithiasis 12/02/2019   Hydronephrosis with renal and ureteral calculus obstruction 12/02/2019   Rectal pain 12/02/2019   Hyperkalemia 12/02/2019   Reactive depression    Wound infection after surgery    Sleep disturbance    Dysphagia, post-stroke    Transaminitis    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   Cerebral abscess    Urinary tract infection without hematuria    Altered mental status    Primary hypercoagulable state ([place])    Acute  pulmonary embolism without acute cor pulmonale (HCC)    Deep vein thrombosis (DVT) of non-extremity vein    Hypokalemia    Acute blood loss anemia    Leukocytosis    Endotracheal tube present    Acute respiratory failure with hypoxemia (HCC)    Stroke (cerebrum) ([place]) 07/19/2019   Pressure injury of skin 07/19/2019   Acute CVA (cerebrovascular accident) ([place])    Encephalopathy    Dysphagia    Acute encephalopathy    Essential hypertension    Obesity 03/12/2016   Scalp abscess 12/20/2015   Neck abscess 12/20/2015   Pilonidal cyst 02/08/2013    ONSET DATE: 01/21/2023  REFERRING DIAG: R25.2 (ICD-10-CM) - Spasticity I69.352 (ICD-10-CM) - 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 No falls. Pt reports he was late today because he was trying to don a jacket since it is cold outside today, was not able to get his jacket on independently.  Pt accompanied by: self  PERTINENT HISTORY: R CVA s/p hemicaniectomy 07/19/19 with abdominal flap implant.  PMH: hx of seizures and headaches s/p CVA, hx of DVT with IVC filter placement . Pt s/p botox on 01/21/22.  PAIN:  Are you having pain? No  PRECAUTIONS: Fall  WEIGHT BEARING RESTRICTIONS: No  FALLS: Has patient fallen in last 6 months? Yes. Number of falls 2; had to call fire department to get back up, didn't get hurt; one time I was in the wheelchair and I had a sock on and when I leaned over to reach for something I just slid  out onto the  floor; another time getting out of truck and running board was wet so slid onto the ground  LIVING ENVIRONMENT: Lives with: lives with their spouse Lives in: House/apartment Stairs: Yes: Internal: 12 steps; on right going up and External: 3 steps; bilateral but cannot reach both Has following equipment at home: Quad cane large base, Wheelchair (manual), and bed side commode  Tried going up stairs inside home about 8 months ago and fell down the stairs, stairs are carpeted.  PLOF: Independent with gait, Independent with transfers, and Requires assistive device for independence  PATIENT GOALS: "putting on my sock and my shoe myself, working on not dragging my L foot when walking or when on the stairs, work on being able to safely walk down the stairs" "to walk without the brace"  OBJECTIVE:   DIAGNOSTIC FINDINGS:   Brain MRI 04/21/2021 IMPRESSION: 1. No emergent finding. 2. Sequela of large remote right MCA territory infarct.  COGNITION: Overall cognitive status: Within functional limits for tasks assessed   SENSATION: Decreased in L hemibody  POSTURE: rounded shoulders, forward head, and posterior pelvic tilt  VITALS: There were no vitals filed for this visit.   TODAY'S TREATMENT:                                                                                                                               NMR  In // bars with RUE support and use of mirror for visual feedback: 4" step taps with RLE with focus on stance on LLE, 3 x 15 reps Standing with 2" under RLE with focus on keeping LLE on the floor Reaching for cones and placing on another table on R side, L side too difficult   PATIENT EDUCATION: Education details: continue HEP Person educated: Patient Education method: Explanation, Demonstration, Tactile cues, Verbal cues, and Handouts Education comprehension: verbalized understanding and needs further education  HOME EXERCISE PROGRAM: Access Code: [email or web address] URL:  [email or web address] Date: 01/30/2023 Prepared by: Excell Seltzer  - Seated Hamstring Stretch  - 1 x daily - 7 x weekly - 1 sets - 10 reps - 30 sec hold   GOALS: Goals reviewed with patient? Yes  SHORT TERM GOALS=LONG TERM GOALS due to length of POC   LONG TERM GOALS: Target date: 02/25/2023  Pt will be independent with final HEP for improved strength, balance, transfers and gait. Baseline:  Goal status: INITIAL  2.  Pt will improve 5 x STS to less than or equal to 25 seconds to demonstrate improved functional strength and transfer efficiency.  Baseline: 29.47 sec (3/4) Goal status: INITIAL  3.  Pt will improve gait velocity to at least 1.0 ft/sec for improved gait efficiency and performance at mod I level  Baseline: 0.7 ft/sec with LBQC (3/4) Goal status: INITIAL  4.  Pt will improve normal TUG to less than or equal to 32 seconds for improved functional mobility and decreased fall risk. Baseline: 37.85 sec with  LBQC (3/4) Goal status: INITIAL   ASSESSMENT:  CLINICAL IMPRESSION: Session limited by pt's late arrival. Emphasis of skilled PT session on working on increasing WB through LLE in standing and increasing weight shift to L side. Pt continues to struggle with this but does exhibit improved performance this session as compared to previous sessions. Pt continues to benefit from skilled therapy services to work towards improving L hemibody strength and NMR in order to improved safety and independence with functional mobility. Continue POC.    OBJECTIVE IMPAIRMENTS: Abnormal gait, decreased balance, decreased coordination, decreased endurance, decreased mobility, difficulty walking, decreased ROM, decreased strength, impaired perceived functional ability, impaired flexibility, impaired tone, and impaired UE functional use.   ACTIVITY LIMITATIONS: carrying, lifting, bending, squatting, stairs, bed mobility, bathing, dressing, reach over head, and  hygiene/grooming  PARTICIPATION LIMITATIONS: meal prep, cleaning, laundry, and community activity  PERSONAL FACTORS: Time since onset of injury/illness/exacerbation and 1-2 comorbidities:    R CVA s/p hemicaniectomy 07/19/19 with abdominal flap implant.  PMH: hx of seizures and headaches s/p CVA, hx of DVT with IVC filter placement are also affecting patient's functional outcome.   REHAB POTENTIAL: Fair time since onset, chronic CVA  CLINICAL DECISION MAKING: Stable/uncomplicated  EVALUATION COMPLEXITY: Low  PLAN:  PT FREQUENCY: 2x/week  PT DURATION: 6 weeks  PLANNED INTERVENTIONS: Therapeutic exercises, Therapeutic activity, Neuromuscular re-education, Balance training, Gait training, Patient/Family education, Self Care, Joint mobilization, Stair training, Visual/preceptual remediation/compensation, Orthotic/Fit training, DME instructions, Aquatic Therapy, Dry Needling, Electrical stimulation, Wheelchair mobility training, Cryotherapy, Moist heat, Taping, Manual therapy, and Re-evaluation  PLAN FOR NEXT SESSION: how is HEP? Add to HEP PRN. assess gait without and with AFO to show him how unsafe he would be without AFO. Does pt want to do aquatic PT 1x/week? Pt needs lots of encouragement, does not like to "fail", continue to work on increasing weight shift to L side with standing tasks, LLE stretching as able   Excell Seltzer, PT, DPT, CSRS  02/10/2023, 2:51 PM

## 2023-02-13 ENCOUNTER — Ambulatory Visit: Payer: Medicare Other | Admitting: Physical Therapy

## 2023-02-13 DIAGNOSIS — R293 Abnormal posture: Secondary | ICD-10-CM

## 2023-02-13 DIAGNOSIS — M6281 Muscle weakness (generalized): Secondary | ICD-10-CM

## 2023-02-13 DIAGNOSIS — R2681 Unsteadiness on feet: Secondary | ICD-10-CM | POA: Diagnosis not present

## 2023-02-13 DIAGNOSIS — R2689 Other abnormalities of gait and mobility: Secondary | ICD-10-CM

## 2023-02-13 NOTE — Therapy (Signed)
OUTPATIENT PHYSICAL THERAPY NEURO TREATMENT   Patient Name: Matthew Black MRN: [id number] DOB:August 19, 1976, 47 y.o., male 41 Date: 02/13/2023   PCP: Mackie Pai, PA-C REFERRING PROVIDER: Izora Ribas, MD  END OF SESSION:  PT End of Session - 02/13/23 1453     Visit Number 6    Number of Visits 13   with eval   Date for PT Re-Evaluation 03/23/23    Authorization Type Medicare    PT Start Time 1450   pt arrived late   PT Stop Time 1533    PT Time Calculation (min) 43 min    Equipment Utilized During Treatment Gait belt    Activity Tolerance Patient tolerated treatment well;Patient limited by fatigue    Behavior During Therapy [facility] for tasks assessed/performed                  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   Depression    DVT (deep venous thrombosis) ([place])    BLE DVT 07/21/19, 08/02/19; s/p retrievable IVC filter 07/21/19   Hemorrhagic stroke ([place])    History of kidney stones    Hypertension    Paralysis ([place])    LEFT SIDE   PE (pulmonary thromboembolism) ([place])    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   Stroke ([place])    RICA, R A1, R MCA occlusion 07/19/19   Past Surgical History:  Procedure Laterality Date   CRANIOPLASTY Right 06/05/2020   Procedure: CRANIOPLASTY;  Surgeon: Consuella Lose, MD;  Location: [place];  Service: Neurosurgery;  Laterality: Right;  right   CRANIOTOMY Right 07/19/2019   Procedure: RIGHT HEMI-CRANIECTOMY With implantation of skull flap to abdominal wall;  Surgeon: Consuella Lose, MD;  Location: [place];  Service: Neurosurgery;  Laterality: Right;   CYST EXCISION N/A 10/08/2016   Procedure: EXCISION OF POSTERIOR NECK CYST;  Surgeon: Clovis Riley, MD;  [facility];  Service: General;  Laterality: N/A;   CYSTOSCOPY/URETEROSCOPY/HOLMIUM LASER/STENT PLACEMENT Right 03/16/2020   Procedure: CYSTOSCOPY RIGHT RETROGRADE PYELOGRAM URETEROSCOPY/HOLMIUM  LASER/STENT PLACEMENT;  Surgeon: Lucas Mallow, MD;  [facility];  Service: Urology;  Laterality: Right;   INCISION AND DRAINAGE ABSCESS N/A 09/22/2014   Procedure: INCISION AND DRAINAGE ABSCESS POSTERIOR NECK;  Surgeon: Pedro Earls, MD;  [facility];  Service: General;  Laterality: N/A;   INCISION AND DRAINAGE ABSCESS N/A 12/20/2015   Procedure: INCISION AND DRAINAGE POSTERIOR NECK MASS;  Surgeon: Armandina Gemma, MD;  [facility];  Service: General;  Laterality: N/A;   INCISION AND DRAINAGE ABSCESS Left 07/10/2004   middle finger   IR IVC FILTER PLMT / S&I /IMG GUID/MOD SED  07/21/2019   IR RADIOLOGIST EVAL & MGMT  12/14/2019   IR RADIOLOGIST EVAL & MGMT  12/21/2020   IR VENOGRAM RENAL UNI RIGHT  07/21/2019   MASS EXCISION N/A 07/21/2017   Procedure: EXCISION OF BENIGN NECK LESION WITH LAYERED CLOSURE;  Surgeon: Irene Limbo, MD;  Location: [place];  Service: Plastics;  Laterality: N/A;   MASS EXCISION N/A 11/10/2017   Procedure: EXCISION BENIGN LESION OF THE NECK WITH LAYERED CLOSURE;  Surgeon: Irene Limbo, MD;  Location: [place];  Service: Plastics;  Laterality: N/A;   Patient Active Problem List   Diagnosis Date Noted   COVID-19 virus infection 04/21/2021   Left-sided weakness 04/21/2021   S/P craniotomy 06/05/2020   History of cranioplasty 06/05/2020   Peri-rectal abscess  02/14/2020   Abnormal CT scan, pelvis 02/14/2020   Pancytopenia ([place]) 12/02/2019   Nephrolithiasis 12/02/2019   Hydronephrosis with renal and ureteral calculus obstruction 12/02/2019   Rectal pain 12/02/2019   Hyperkalemia 12/02/2019   Reactive depression    Wound infection after surgery    Sleep disturbance    Dysphagia, post-stroke    Transaminitis    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   Cerebral abscess    Urinary tract infection without hematuria    Altered mental status    Primary hypercoagulable state ([place])    Acute  pulmonary embolism without acute cor pulmonale (HCC)    Deep vein thrombosis (DVT) of non-extremity vein    Hypokalemia    Acute blood loss anemia    Leukocytosis    Endotracheal tube present    Acute respiratory failure with hypoxemia (HCC)    Stroke (cerebrum) ([place]) 07/19/2019   Pressure injury of skin 07/19/2019   Acute CVA (cerebrovascular accident) ([place])    Encephalopathy    Dysphagia    Acute encephalopathy    Essential hypertension    Obesity 03/12/2016   Scalp abscess 12/20/2015   Neck abscess 12/20/2015   Pilonidal cyst 02/08/2013    ONSET DATE: 01/21/2023  REFERRING DIAG: R25.2 (ICD-10-CM) - Spasticity I69.352 (ICD-10-CM) - 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 No complaints of pain today.  Pt accompanied by: self  PERTINENT HISTORY: R CVA s/p hemicaniectomy 07/19/19 with abdominal flap implant.  PMH: hx of seizures and headaches s/p CVA, hx of DVT with IVC filter placement . Pt s/p botox on 01/21/22.  PAIN:  Are you having pain? No  PRECAUTIONS: Fall  WEIGHT BEARING RESTRICTIONS: No  FALLS: Has patient fallen in last 6 months? Yes. Number of falls 2; had to call fire department to get back up, didn't get hurt; one time I was in the wheelchair and I had a sock on and when I leaned over to reach for something I just slid out onto the floor; another time getting out of truck and running board was wet so slid onto the ground  LIVING  ENVIRONMENT: Lives  with: lives with their spouse Lives in: House/apartment Stairs: Yes: Internal: 12 steps; on right going up and External: 3 steps; bilateral but cannot reach both Has following equipment at home: Quad cane large base, Wheelchair (manual), and bed side commode  Tried going up stairs inside home about 8 months ago and fell down the stairs, stairs are carpeted.  PLOF: Independent with gait, Independent with transfers, and Requires assistive device for independence  PATIENT GOALS: "putting on my sock and my shoe myself, working on not dragging my L foot when walking or when on the stairs, work on being able to safely walk down the stairs" "to walk without the brace"  OBJECTIVE:   DIAGNOSTIC FINDINGS:   Brain MRI 04/21/2021 IMPRESSION: 1. No emergent finding. 2. Sequela of large remote right MCA territory infarct.  COGNITION: Overall cognitive status: Within functional limits for tasks assessed   SENSATION: Decreased in L hemibody  POSTURE: rounded shoulders, forward head, and posterior pelvic tilt  VITALS: There were no vitals filed for this visit.   TODAY'S TREATMENT:      TherAct: Discussed transition to pool PT vs pool OT, pt open to transition with encouragment and education but not available in the morning to see Tues aquatic therapist. Will discuss with Mon PM aquatic therapist to determine if pt able to transition to pool PT.                                                                                                                           NMR  In // bars with RUE support with transition to no UE support: RLE on 4" step with focus on keeping LLE on floor and weight shifted to the L Pt with improved ability to keep LLE planted on floor, min tactile cueing to shift hips to the L with decrease in UE support  In // bars with RUE support: 4" step downs with LLE for practice of descending stairs Pt significantly fatigued, needs seated rest break (fatigued from bringing  LLE backwards up onto steps)   PATIENT EDUCATION: Education details: continue HEP, pool therapy Person educated: Patient Education method: Explanation, Demonstration, Tactile cues, Verbal cues, and Handouts Education comprehension: verbalized understanding and needs further education  HOME EXERCISE PROGRAM: Access Code: [email or web address] URL: [email or web address] Date: 01/30/2023 Prepared by: Excell Seltzer  - Seated Hamstring Stretch  - 1 x daily - 7 x weekly - 1 sets - 10 reps - 30 sec hold   GOALS: Goals reviewed with patient? Yes  SHORT TERM GOALS=LONG TERM GOALS due to length of POC   LONG TERM GOALS: Target date: 02/25/2023  Pt will be independent with final HEP for improved strength, balance, transfers and gait. Baseline:  Goal status: INITIAL  2.  Pt will improve 5 x STS to less than or equal to 25 seconds to demonstrate improved functional strength and transfer efficiency.  Baseline: 29.47 sec (3/4) Goal status: INITIAL  3.  Pt will improve  gait velocity to at least 1.0 ft/sec for improved gait efficiency and performance at mod I level  Baseline: 0.7 ft/sec with LBQC (3/4) Goal status: INITIAL  4.  Pt will improve normal TUG to less than or equal to 32 seconds for improved functional mobility and decreased fall risk. Baseline: 37.85 sec with LBQC (3/4) Goal status: INITIAL   ASSESSMENT:  CLINICAL IMPRESSION: Emphasis of skilled PT session on discussing transition to aquatic PT from aquatic OT and continuing to work on LLE NMR and increasing WB through limb. Pt exhibits improved ability to weight shift to LLE this date and keep weight through limb. Pt continues to benefit from skilled therapy services to work towards [place]. Continue POC.    OBJECTIVE IMPAIRMENTS: Abnormal gait, decreased balance, decreased coordination, decreased endurance, decreased mobility, difficulty walking, decreased ROM, decreased strength, impaired perceived functional ability,  impaired flexibility, impaired tone, and impaired UE functional use.   ACTIVITY LIMITATIONS: carrying, lifting, bending, squatting, stairs, bed mobility, bathing, dressing, reach over head, and hygiene/grooming  PARTICIPATION LIMITATIONS: meal prep, cleaning, laundry, and community activity  PERSONAL FACTORS: Time since onset of injury/illness/exacerbation and 1-2 comorbidities:    R CVA s/p hemicaniectomy 07/19/19 with abdominal flap implant.  PMH: hx of seizures and headaches s/p CVA, hx of DVT with IVC filter placement are also affecting patient's functional outcome.   REHAB POTENTIAL: Fair time since onset, chronic CVA  CLINICAL DECISION MAKING: Stable/uncomplicated  EVALUATION COMPLEXITY: Low  PLAN:  PT FREQUENCY: 2x/week  PT DURATION: 6 weeks  PLANNED INTERVENTIONS: Therapeutic exercises, Therapeutic activity, Neuromuscular re-education, Balance training, Gait training, Patient/Family education, Self Care, Joint mobilization, Stair training, Visual/preceptual remediation/compensation, Orthotic/Fit training, DME instructions, Aquatic Therapy, Dry Needling, Electrical stimulation, Wheelchair mobility training, Cryotherapy, Moist heat, Taping, Manual therapy, and Re-evaluation  PLAN FOR NEXT SESSION: how is HEP? Add to HEP PRN. Does pt want to do aquatic PT 1x/week-discuss with [place]? Pt needs lots of encouragement, does not like to "fail", continue to work on increasing weight shift to L side with standing tasks, LLE stretching as able, stairs (4" and 6" step downs in // bars with transition to working on descending stairs)     Excell Seltzer, PT, DPT, CSRS  02/13/2023, 3:34 PM

## 2023-02-16 ENCOUNTER — Ambulatory Visit: Payer: Medicare Other | Admitting: Occupational Therapy

## 2023-02-16 ENCOUNTER — Encounter: Payer: Self-pay | Admitting: Occupational Therapy

## 2023-02-16 DIAGNOSIS — R293 Abnormal posture: Secondary | ICD-10-CM

## 2023-02-16 DIAGNOSIS — R208 Other disturbances of skin sensation: Secondary | ICD-10-CM

## 2023-02-16 DIAGNOSIS — R2681 Unsteadiness on feet: Secondary | ICD-10-CM | POA: Diagnosis not present

## 2023-02-16 DIAGNOSIS — M6281 Muscle weakness (generalized): Secondary | ICD-10-CM

## 2023-02-16 DIAGNOSIS — I69354 Hemiplegia and hemiparesis following cerebral infarction affecting left non-dominant side: Secondary | ICD-10-CM

## 2023-02-16 NOTE — Therapy (Signed)
OUTPATIENT OCCUPATIONAL THERAPY NEURO EVALUATION  Patient Name: Matthew Black MRN: [id number] DOB:1976-10-10, 47 y.o., male Today's Date: 12/01/2022  PCP: Mackie Pai, PA-C REFERRING PROVIDER: Leeroy Cha  END OF SESSION:  OT End of Session - 12/01/22 1712     Visit Number 3   Number of Visits 9    Date for OT Re-Evaluation 02/14/23    Authorization Type Medicare A&B/Medicaid    Progress Note Due on Visit 10    OT Start Time 1415    OT Stop Time 1500    OT Time Calculation (min) 45 min    Equipment Utilized During Treatment floatation,    Activity Tolerance Patient tolerated treatment well             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   Depression    DVT (deep venous thrombosis) ([place])    BLE DVT 07/21/19, 08/02/19; s/p retrievable IVC filter 07/21/19   Hemorrhagic stroke ([place])    History of kidney stones    Hypertension    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   PE (pulmonary thromboembolism) ([place])    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   Stroke ([place])    RICA, R A1, R MCA occlusion 07/19/19   Past Surgical History:  Procedure Laterality Date   CRANIOPLASTY Right 06/05/2020   Procedure: CRANIOPLASTY;  Surgeon: Consuella Lose, MD;  Location: [place];  Service: Neurosurgery;  Laterality: Right;  right   CRANIOTOMY Right 07/19/2019   Procedure: RIGHT HEMI-CRANIECTOMY With implantation of skull flap to abdominal wall;  Surgeon: Consuella Lose, MD;  Location: [place];  Service: Neurosurgery;  Laterality: Right;   CYST EXCISION N/A 10/08/2016   Procedure: EXCISION OF POSTERIOR NECK CYST;  Surgeon: Clovis Riley, MD;  [facility];  Service: General;  Laterality: N/A;   CYSTOSCOPY/URETEROSCOPY/HOLMIUM LASER/STENT PLACEMENT Right 03/16/2020   Procedure: CYSTOSCOPY RIGHT RETROGRADE PYELOGRAM URETEROSCOPY/HOLMIUM LASER/STENT PLACEMENT;  Surgeon: Lucas Mallow, MD;  [facility];  Service: Urology;   Laterality: Right;   INCISION AND DRAINAGE ABSCESS N/A 09/22/2014   Procedure: INCISION AND DRAINAGE ABSCESS POSTERIOR NECK;  Surgeon: Pedro Earls, MD;  [facility];  Service: General;  Laterality: N/A;   INCISION AND DRAINAGE ABSCESS N/A 12/20/2015   Procedure: INCISION AND DRAINAGE POSTERIOR NECK MASS;  Surgeon: Armandina Gemma, MD;  [facility];  Service: General;  Laterality: N/A;   INCISION AND DRAINAGE ABSCESS Left 07/10/2004   middle finger   IR IVC FILTER PLMT / S&I /IMG GUID/MOD SED  07/21/2019   IR RADIOLOGIST EVAL & MGMT  12/14/2019   IR RADIOLOGIST EVAL & MGMT  12/21/2020   IR VENOGRAM RENAL UNI RIGHT  07/21/2019   MASS EXCISION N/A 07/21/2017   Procedure: EXCISION OF BENIGN NECK LESION WITH LAYERED CLOSURE;  Surgeon: Irene Limbo, MD;  Location: [place];  Service: Plastics;  Laterality: N/A;   MASS EXCISION N/A 11/10/2017   Procedure: EXCISION BENIGN LESION OF THE NECK WITH LAYERED CLOSURE;  Surgeon: Irene Limbo, MD;  Location: [place];  Service: Plastics;  Laterality: N/A;   Patient Active Problem List   Diagnosis Date Noted   COVID-19 virus infection 04/21/2021   Left-sided weakness 04/21/2021   S/P craniotomy 06/05/2020   History of cranioplasty 06/05/2020   Peri-rectal abscess 02/14/2020   Abnormal CT scan, pelvis 02/14/2020   Pancytopenia ([place]) 12/02/2019   Nephrolithiasis 12/02/2019   Hydronephrosis with  renal and ureteral calculus obstruction 12/02/2019   Rectal pain 12/02/2019   Hyperkalemia 12/02/2019   Reactive depression    Wound infection after surgery    Sleep disturbance    Dysphagia, post-stroke    Transaminitis    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   Cerebral abscess    Urinary tract infection without hematuria    Altered mental status    Primary hypercoagulable state ([place])    Acute pulmonary embolism without acute cor pulmonale (HCC)    Deep vein thrombosis (DVT) of  non-extremity vein    Hypokalemia    Acute blood loss anemia    Leukocytosis    Endotracheal tube present    Acute respiratory failure with hypoxemia (HCC)    Stroke (cerebrum) ([place]) 07/19/2019   Pressure injury of skin 07/19/2019   Acute CVA (cerebrovascular accident) ([place])    Encephalopathy    Dysphagia    Acute encephalopathy    Essential hypertension    Obesity 03/12/2016   Scalp abscess 12/20/2015   Neck abscess 12/20/2015   Pilonidal cyst 02/08/2013    ONSET DATE: 10/20/22 referral date  REFERRING DIAG: Spastic hemilegia  THERAPY DIAG:  Spastic hemiplegia of left nondominant side as late effect of cerebral infarction ([place])  Other disturbances of skin sensation  Unsteadiness on feet  Muscle weakness (generalized)  Chronic left shoulder pain  Abnormal posture  Stiffness of left shoulder, not elsewhere classified  Apraxia  Other symptoms and signs involving the musculoskeletal system  Rationale for Evaluation and Treatment: Rehabilitation  SUBJECTIVE:   SUBJECTIVE STATEMENT: I could walk to the bathroom without my brace after the pool.   Pt accompanied by: family member  PERTINENT HISTORY: hemorrhagic stroke  PRECAUTIONS: Fall  WEIGHT BEARING RESTRICTIONS: No  PAIN:  Are you having pain? Yes: NPRS scale: 9/10 Pain location: left arm Pain description: aching, sharp Aggravating factors: movement Relieving factors: Tylenol  FALLS: Has patient fallen in last 6 months? yes  LIVING ENVIRONMENT: Lives with: lives with their family and lives with their spouse Lives in: House/apartment Stairs: yes Has following equipment at home: Wheelchair (manual) and hospital bed  PLOF: Independent with basic ADLs  PATIENT GOALS: reduce pain, improve mobility  OBJECTIVE:   HAND DOMINANCE: Right  ADLs: Overall ADLs: mod assist Transfers/ambulation related to ADLs: walks with quad cane Eating: Mod I Grooming: Mod I UB Dressing: Mod assist LB Dressing:  max assist Toileting: min assist Bathing: Mod assist Tub Shower transfers: Shower is upstairs - sponge bathes  IADLs: Shopping: Min assist Light housekeeping: Min assist Meal Prep: Min assist Community mobility: Min assist   MOBILITY STATUS: difficulty carrying objections with ambulation  POSTURE COMMENTS:  rounded shoulders, forward head, posterior pelvic tilt, and flexed trunk  Sitting balance: Supports self with one extremity    UPPER EXTREMITY ROM:    Passive ROM Right eval Left eval  Shoulder flexion WFL 40  Shoulder abduction  40  Shoulder adduction    Shoulder extension    Shoulder internal rotation    Shoulder external rotation    Elbow flexion    Elbow extension  -25  Wrist flexion    Wrist extension    Wrist ulnar deviation    Wrist radial deviation    Wrist pronation    Wrist supination    (Blank rows = not tested)  UPPER EXTREMITY MMT:     MMT Right eval Left eval  Shoulder flexion [facility] NT  Shoulder abduction    Shoulder adduction  Shoulder extension    Shoulder internal rotation    Shoulder external rotation    Middle trapezius    Lower trapezius    Elbow flexion    Elbow extension    Wrist flexion    Wrist extension    Wrist ulnar deviation    Wrist radial deviation    Wrist pronation    Wrist supination    (Blank rows = not tested)   COORDINATION: No formal assessment completed.  No volitional movement In LUE  SENSATION: Light touch: Impaired    MUSCLE TONE: LUE: Moderate  COGNITION: Overall cognitive status: Impaired   PERCEPTION: Impaired: Inattention/neglect: does not attend to left side of body  PRAXIS: Impaired: Ideomotor and Motor planning    TODAY'S TREATMENT:                                                                                                                              DATE: 01/19/23: Patient seen for aquatic therapy visit.  Patient entered and exited the pool via stairs and supervision.      Patient reports  he continues to take his baclofen as ordered (3x/day) and reports less tension /pain in left arm/leg.   Worked on dynamic standing balance without UE support.   Worked to increase stance position of left leg, and also left shoulder abduction range of motion.   Will transition care to PT at this time.   Patient in agreement.  Education details: Automotive engineer Person educated: Patient Education method: Explanation Education comprehension: verbalized understanding  HOME EXERCISE PROGRAM: TBD   GOALS: Goals viewed with patient? Yes  SHORT TERM GOALS: Target date: 01/01/23  Patient will report decrease in pain in left shoulder by at least 3 points (0-10 scale) following aquatics exercise program  Goal status: MET  2.  Patient will enter and exit the pool with one hand rail and supervision Baseline:  Goal status: MET   LONG TERM GOALS: Target date: 01/30/23  Patient will complete an aquatic exercise program to address LUE/LLE range of motion Baseline:  Goal status: NOT MET  2.  Patient will complete an aquatic exercise program designed to address dynamic standing balance Baseline:  Goal status: NOT MET     ASSESSMENT:  CLINICAL IMPRESSION: Patient will continue addressing postural control issues with PT PERFORMANCE DEFICITS: in functional skills including ADLs, coordination, proprioception, sensation, tone, ROM, strength, pain, flexibility, Fine motor control, Gross motor control, mobility, balance, body mechanics, endurance, decreased knowledge of use of DME, and UE functional use, cognitive skills including attention, emotional, memory, problem solving, and safety awareness, and psychosocial skills including environmental adaptation.   IMPAIRMENTS: are limiting patient from ADLs.   CO-MORBIDITIES: may have co-morbidities  that affects occupational performance. Patient will benefit from skilled OT to address above impairments and improve overall  function.  MODIFICATION OR ASSISTANCE TO COMPLETE EVALUATION: Min-Moderate modification of tasks or assist with assess necessary to complete an evaluation.  OT OCCUPATIONAL  PROFILE AND HISTORY: Detailed assessment: Review of records and additional review of physical, cognitive, psychosocial history related to current functional performance.  CLINICAL DECISION MAKING: Moderate - several treatment options, min-mod task modification necessary  REHAB POTENTIAL: Good  EVALUATION COMPLEXITY: Moderate    PLAN:  OT FREQUENCY: 1x/week  OT DURATION: 8 weeks  PLANNED INTERVENTIONS: self care/ADL training, therapeutic exercise, therapeutic activity, neuromuscular re-education, balance training, functional mobility training, aquatic therapy, patient/family education, visual/perceptual remediation/compensation, and DME and/or AE instructions  RECOMMENDED OTHER SERVICES: NA  CONSULTED AND AGREED WITH PLAN OF CARE: Patient  PLAN FOR NEXT SESSION: D/C OT  Mariah Milling, OT 02/16/23 5:15 PM

## 2023-02-17 ENCOUNTER — Ambulatory Visit: Payer: Medicare Other | Admitting: Physical Therapy

## 2023-02-17 DIAGNOSIS — R2681 Unsteadiness on feet: Secondary | ICD-10-CM | POA: Diagnosis not present

## 2023-02-17 DIAGNOSIS — M6281 Muscle weakness (generalized): Secondary | ICD-10-CM

## 2023-02-17 DIAGNOSIS — I69354 Hemiplegia and hemiparesis following cerebral infarction affecting left non-dominant side: Secondary | ICD-10-CM

## 2023-02-17 NOTE — Therapy (Signed)
OUTPATIENT PHYSICAL THERAPY NEURO TREATMENT   Patient Name: Matthew Black MRN: [id number] DOB:[birth year], 47 y.o., male 60 Date: 02/17/2023   PCP: Mackie Pai, PA-C REFERRING PROVIDER: Izora Ribas, MD  END OF SESSION:  PT End of Session - 02/17/23 1408     Visit Number 7    Number of Visits 13   with eval   Date for PT Re-Evaluation 03/23/23    Authorization Type Medicare    PT Start Time 1404    PT Stop Time [id number]    PT Time Calculation (min) 40 min    Equipment Utilized During Treatment Gait belt    Activity Tolerance Patient tolerated treatment well    Behavior During Therapy WFL for tasks assessed/performed                  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   Depression    DVT (deep venous thrombosis) ([place])    BLE DVT 07/21/19, 08/02/19; s/p retrievable IVC filter 07/21/19   Hemorrhagic stroke ([place])    History of kidney stones    Hypertension    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   PE (pulmonary thromboembolism) ([place])    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   Stroke ([place])    RICA, R A1, R MCA occlusion 07/19/19   Past Surgical History:  Procedure Laterality Date   CRANIOPLASTY Right 06/05/2020   Procedure: CRANIOPLASTY;  Surgeon: Consuella Lose, MD;  Location: [place];  Service: Neurosurgery;  Laterality: Right;  right   CRANIOTOMY Right 07/19/2019   Procedure: RIGHT HEMI-CRANIECTOMY With implantation of skull flap to abdominal wall;  Surgeon: Consuella Lose, MD;  Location: [place];  Service: Neurosurgery;  Laterality: Right;   CYST EXCISION N/A 10/08/2016   Procedure: EXCISION OF POSTERIOR NECK CYST;  Surgeon: Clovis Riley, MD;  [facility];  Service: General;  Laterality: N/A;   CYSTOSCOPY/URETEROSCOPY/HOLMIUM LASER/STENT PLACEMENT Right 03/16/2020   Procedure: CYSTOSCOPY RIGHT RETROGRADE PYELOGRAM URETEROSCOPY/HOLMIUM LASER/STENT PLACEMENT;  Surgeon: Lucas Mallow, MD;  [facility];  Service: Urology;  Laterality: Right;   INCISION AND DRAINAGE ABSCESS N/A 09/22/2014   Procedure: INCISION AND DRAINAGE ABSCESS POSTERIOR NECK;  Surgeon: Pedro Earls, MD;  [facility];  Service: General;  Laterality: N/A;   INCISION AND DRAINAGE ABSCESS N/A 12/20/2015   Procedure: INCISION AND DRAINAGE POSTERIOR NECK MASS;  Surgeon: Armandina Gemma, MD;  [facility];  Service: General;  Laterality: N/A;   INCISION AND DRAINAGE ABSCESS Left 07/10/2004   middle finger   IR IVC FILTER PLMT / S&I /IMG GUID/MOD SED  07/21/2019   IR RADIOLOGIST EVAL & MGMT  12/14/2019   IR RADIOLOGIST EVAL & MGMT  12/21/2020   IR VENOGRAM RENAL UNI RIGHT  07/21/2019   MASS EXCISION N/A 07/21/2017   Procedure: EXCISION OF BENIGN NECK LESION WITH LAYERED CLOSURE;  Surgeon: Irene Limbo, MD;  Location: [place];  Service: Plastics;  Laterality: N/A;   MASS EXCISION N/A 11/10/2017   Procedure: EXCISION BENIGN LESION OF THE NECK WITH LAYERED CLOSURE;  Surgeon: Irene Limbo, MD;  Location: [place];  Service: Plastics;  Laterality: N/A;   Patient Active Problem List   Diagnosis Date Noted   COVID-19 virus infection 04/21/2021   Left-sided weakness 04/21/2021   S/P craniotomy 06/05/2020   History of cranioplasty 06/05/2020   Peri-rectal abscess 02/14/2020   Abnormal CT scan, pelvis  02/14/2020   Pancytopenia ([place]) 12/02/2019   Nephrolithiasis 12/02/2019   Hydronephrosis with renal and ureteral calculus obstruction 12/02/2019   Rectal pain 12/02/2019   Hyperkalemia 12/02/2019   Reactive depression    Wound infection after surgery    Sleep disturbance    Dysphagia, post-stroke    Transaminitis    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   Cerebral abscess    Urinary tract infection without hematuria    Altered mental status    Primary hypercoagulable state ([place])    Acute pulmonary embolism without acute cor pulmonale  (HCC)    Deep vein thrombosis (DVT) of non-extremity vein    Hypokalemia    Acute blood loss anemia    Leukocytosis    Endotracheal tube present    Acute respiratory failure with hypoxemia (HCC)    Stroke (cerebrum) ([place]) 07/19/2019   Pressure injury of skin 07/19/2019   Acute CVA (cerebrovascular accident) ([place])    Encephalopathy    Dysphagia    Acute encephalopathy    Essential hypertension    Obesity 03/12/2016   Scalp abscess 12/20/2015   Neck abscess 12/20/2015   Pilonidal cyst 02/08/2013    ONSET DATE: 01/21/2023  REFERRING DIAG: R25.2 (ICD-10-CM) - Spasticity I69.352 (ICD-10-CM) - 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 No complaints of pain today.  Pt accompanied by: self  PERTINENT HISTORY: R CVA s/p hemicaniectomy 07/19/19 with abdominal flap implant.  PMH: hx of seizures and headaches s/p CVA, hx of DVT with IVC filter placement . Pt s/p botox on 01/21/22.  PAIN:  Are you having pain? No  PRECAUTIONS: Fall  WEIGHT BEARING RESTRICTIONS: No  FALLS: Has patient fallen in last 6 months? Yes. Number of falls 2; had to call fire department to get back up, didn't get hurt; one time I was in the wheelchair and I had a sock on and when I leaned over to reach for something I just slid out onto the floor; another time getting out of truck and running board was wet so slid onto the ground  LIVING ENVIRONMENT: Lives with: lives  with their spouse Lives in: House/apartment Stairs: Yes: Internal: 12 steps; on right going up and External: 3 steps; bilateral but cannot reach both Has following equipment at home: Quad cane large base, Wheelchair (manual), and bed side commode  Tried going up stairs inside home about 8 months ago and fell down the stairs, stairs are carpeted.  PLOF: Independent with gait, Independent with transfers, and Requires assistive device for independence  PATIENT GOALS: "putting on my sock and my shoe myself, working on not dragging my L foot when walking or when on the stairs, work on being able to safely walk down the stairs" "to walk without the brace"  OBJECTIVE:   DIAGNOSTIC FINDINGS:   Brain MRI 04/21/2021 IMPRESSION: 1. No emergent finding. 2. Sequela of large remote right MCA territory infarct.  COGNITION: Overall cognitive status: Within functional limits for tasks assessed   SENSATION: Decreased in L hemibody  POSTURE: rounded shoulders, forward head, and posterior pelvic tilt  VITALS: There were no vitals filed for this visit.   TODAY'S TREATMENT:                                                                                                                               NMR  Attempted to get into tall kneel position on mat w/max A of two therapists, but due to extensor tone, unable to obtain knee flexion on L side. Then attempted to perform sit > supine > prone, but pt unable to get into prone position due to L shoulder pain. However, pt able to initiate transfers independently and only required min A for LUE management.  Sit <>stand x3 using mirror for visual biofeedback on body position to promote weight shift to L side. Noted very narrow BOS so cued for wider BOS for improved stability. Pt reported feeling more stable w/wider BOS and was able to facilitate weight shift to L side w/change in foot position. Encouraged pt to practice this at home while performing ADLs, like  brushing teeth. Pt verbalized understanding.   Ther Act  Had Vinnie Level discuss pool schedule w/pt and pt in agreement to schedule pool on Mondays w/Suzanne for 3 visits.   PATIENT EDUCATION: Education details: continue HEP, practicing wider BOS w/standing tasks  Person educated: Patient Education method: Explanation, Demonstration, Tactile cues, and Verbal cues Education comprehension: verbalized understanding and needs further education  HOME EXERCISE PROGRAM: Access Code: [email or web address] URL: [email or web address] Date: 01/30/2023 Prepared by: Excell Seltzer  - Seated Hamstring Stretch  - 1 x daily - 7 x weekly - 1 sets - 10 reps - 30 sec hold   GOALS: Goals reviewed with patient? Yes  SHORT TERM GOALS=LONG TERM GOALS due to length of POC   LONG TERM GOALS: Target date: 02/25/2023  Pt will be independent with final HEP for improved strength, balance, transfers and gait. Baseline:  Goal status: INITIAL  2.  Pt will improve 5 x STS to less than or equal to 25  seconds to demonstrate improved functional strength and transfer efficiency.  Baseline: 29.47 sec (3/4) Goal status: INITIAL  3.  Pt will improve gait velocity to at least 1.0 ft/sec for improved gait efficiency and performance at mod I level  Baseline: 0.7 ft/sec with LBQC (3/4) Goal status: INITIAL  4.  Pt will improve normal TUG to less than or equal to 32 seconds for improved functional mobility and decreased fall risk. Baseline: 37.85 sec with LBQC (3/4) Goal status: INITIAL   ASSESSMENT:  CLINICAL IMPRESSION: Emphasis of skilled PT session on weight acceptance to L side, facilitation of wider BOS and transfers. Attempted to perform session in tall kneeling on mat, but due to extensor tone, unable to facilitate L knee flexion to get into position on mat even with 2 therapists. Shifted session to work on lateral weight shifting to L side in stance and promoting wider BOS for improved stability in stance. Pt  did well w/use of mirror for visual biofeedback and verbalized understanding to practice this at home. Continue POC.    OBJECTIVE IMPAIRMENTS: Abnormal gait, decreased balance, decreased coordination, decreased endurance, decreased mobility, difficulty walking, decreased ROM, decreased strength, impaired perceived functional ability, impaired flexibility, impaired tone, and impaired UE functional use.   ACTIVITY LIMITATIONS: carrying, lifting, bending, squatting, stairs, bed mobility, bathing, dressing, reach over head, and hygiene/grooming  PARTICIPATION LIMITATIONS: meal prep, cleaning, laundry, and community activity  PERSONAL FACTORS: Time since onset of injury/illness/exacerbation and 1-2 comorbidities:    R CVA s/p hemicaniectomy 07/19/19 with abdominal flap implant.  PMH: hx of seizures and headaches s/p CVA, hx of DVT with IVC filter placement are also affecting patient's functional outcome.   REHAB POTENTIAL: Fair time since onset, chronic CVA  CLINICAL DECISION MAKING: Stable/uncomplicated  EVALUATION COMPLEXITY: Low  PLAN:  PT FREQUENCY: 2x/week  PT DURATION: 6 weeks  PLANNED INTERVENTIONS: Therapeutic exercises, Therapeutic activity, Neuromuscular re-education, Balance training, Gait training, Patient/Family education, Self Care, Joint mobilization, Stair training, Visual/preceptual remediation/compensation, Orthotic/Fit training, DME instructions, Aquatic Therapy, Dry Needling, Electrical stimulation, Wheelchair mobility training, Cryotherapy, Moist heat, Taping, Manual therapy, and Re-evaluation  PLAN FOR NEXT SESSION: how is HEP? Work on wider BOS in stance. Add to HEP PRN. Pt needs lots of encouragement, does not like to "fail", continue to work on increasing weight shift to L side with standing tasks, LLE stretching as able, stairs (4" and 6" step downs in // bars with transition to working on descending stairs)     Cruzita Lederer Easter Schinke, PT, DPT 02/17/2023, 2:45 PM

## 2023-02-19 ENCOUNTER — Ambulatory Visit: Payer: Medicare Other | Admitting: Physical Therapy

## 2023-02-19 DIAGNOSIS — R2689 Other abnormalities of gait and mobility: Secondary | ICD-10-CM

## 2023-02-19 DIAGNOSIS — M6281 Muscle weakness (generalized): Secondary | ICD-10-CM

## 2023-02-19 DIAGNOSIS — R293 Abnormal posture: Secondary | ICD-10-CM

## 2023-02-19 DIAGNOSIS — R2681 Unsteadiness on feet: Secondary | ICD-10-CM

## 2023-02-19 NOTE — Therapy (Signed)
OUTPATIENT PHYSICAL THERAPY NEURO TREATMENT   Patient Name: Matthew Black MRN: [id number] DOB:[birth year], 47 y.o., male 52 Date: 02/19/2023   PCP: Mackie Pai, PA-C REFERRING PROVIDER: Izora Ribas, MD  END OF SESSION:  PT End of Session - 02/19/23 1409     Visit Number 8    Number of Visits 13   with eval   Date for PT Re-Evaluation 03/23/23    Authorization Type Medicare    PT Start Time 1404    PT Stop Time 1446    PT Time Calculation (min) 42 min    Equipment Utilized During Treatment Gait belt    Activity Tolerance Patient tolerated treatment well    Behavior During Therapy WFL for tasks assessed/performed                   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   Depression    DVT (deep venous thrombosis) ([place])    BLE DVT 07/21/19, 08/02/19; s/p retrievable IVC filter 07/21/19   Hemorrhagic stroke ([place])    History of kidney stones    Hypertension    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   PE (pulmonary thromboembolism) ([place])    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   Stroke ([place])    RICA, R A1, R MCA occlusion 07/19/19   Past Surgical History:  Procedure Laterality Date   CRANIOPLASTY Right 06/05/2020   Procedure: CRANIOPLASTY;  Surgeon: Consuella Lose, MD;  Location: [place];  Service: Neurosurgery;  Laterality: Right;  right   CRANIOTOMY Right 07/19/2019   Procedure: RIGHT HEMI-CRANIECTOMY With implantation of skull flap to abdominal wall;  Surgeon: Consuella Lose, MD;  Location: [place];  Service: Neurosurgery;  Laterality: Right;   CYST EXCISION N/A 10/08/2016   Procedure: EXCISION OF POSTERIOR NECK CYST;  Surgeon: Clovis Riley, MD;  [facility];  Service: General;  Laterality: N/A;   CYSTOSCOPY/URETEROSCOPY/HOLMIUM LASER/STENT PLACEMENT Right 03/16/2020   Procedure: CYSTOSCOPY RIGHT RETROGRADE PYELOGRAM URETEROSCOPY/HOLMIUM LASER/STENT PLACEMENT;  Surgeon: Lucas Mallow, MD;  [facility];  Service: Urology;  Laterality: Right;   INCISION AND DRAINAGE ABSCESS N/A 09/22/2014   Procedure: INCISION AND DRAINAGE ABSCESS POSTERIOR NECK;  Surgeon: Pedro Earls, MD;  [facility];  Service: General;  Laterality: N/A;   INCISION AND DRAINAGE ABSCESS N/A 12/20/2015   Procedure: INCISION AND DRAINAGE POSTERIOR NECK MASS;  Surgeon: Armandina Gemma, MD;  [facility];  Service: General;  Laterality: N/A;   INCISION AND DRAINAGE ABSCESS Left 07/10/2004   middle finger   IR IVC FILTER PLMT / S&I /IMG GUID/MOD SED  07/21/2019   IR RADIOLOGIST EVAL & MGMT  12/14/2019   IR RADIOLOGIST EVAL & MGMT  12/21/2020   IR VENOGRAM RENAL UNI RIGHT  07/21/2019   MASS EXCISION N/A 07/21/2017   Procedure: EXCISION OF BENIGN NECK LESION WITH LAYERED CLOSURE;  Surgeon: Irene Limbo, MD;  Location: [place];  Service: Plastics;  Laterality: N/A;   MASS EXCISION N/A 11/10/2017   Procedure: EXCISION BENIGN LESION OF THE NECK WITH LAYERED CLOSURE;  Surgeon: Irene Limbo, MD;  Location: [place];  Service: Plastics;  Laterality: N/A;   Patient Active Problem List   Diagnosis Date Noted   COVID-19 virus infection 04/21/2021   Left-sided weakness 04/21/2021   S/P craniotomy 06/05/2020   History of cranioplasty 06/05/2020   Peri-rectal abscess 02/14/2020   Abnormal CT scan,  pelvis 02/14/2020   Pancytopenia ([place]) 12/02/2019   Nephrolithiasis 12/02/2019   Hydronephrosis with renal and ureteral calculus obstruction 12/02/2019   Rectal pain 12/02/2019   Hyperkalemia 12/02/2019   Reactive depression    Wound infection after surgery    Sleep disturbance    Dysphagia, post-stroke    Transaminitis    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   Cerebral abscess    Urinary tract infection without hematuria    Altered mental status    Primary hypercoagulable state ([place])    Acute pulmonary embolism without acute cor pulmonale  (HCC)    Deep vein thrombosis (DVT) of non-extremity vein    Hypokalemia    Acute blood loss anemia    Leukocytosis    Endotracheal tube present    Acute respiratory failure with hypoxemia (HCC)    Stroke (cerebrum) ([place]) 07/19/2019   Pressure injury of skin 07/19/2019   Acute CVA (cerebrovascular accident) ([place])    Encephalopathy    Dysphagia    Acute encephalopathy    Essential hypertension    Obesity 03/12/2016   Scalp abscess 12/20/2015   Neck abscess 12/20/2015   Pilonidal cyst 02/08/2013    ONSET DATE: 01/21/2023  REFERRING DIAG: R25.2 (ICD-10-CM) - Spasticity I69.352 (ICD-10-CM) - 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 No complaints of pain today.   Pt accompanied by: self  PERTINENT HISTORY: R CVA s/p hemicaniectomy 07/19/19 with abdominal flap implant.  PMH: hx of seizures and headaches s/p CVA, hx of DVT with IVC filter placement . Pt s/p botox on 01/21/22.  PAIN:  Are you having pain? No  PRECAUTIONS: Fall  WEIGHT BEARING RESTRICTIONS: No  FALLS: Has patient fallen in last 6 months? Yes. Number of falls 2; had to call fire department to get back up, didn't get hurt; one time I was in the wheelchair and I had a sock on and when I leaned over to reach for something I just slid out onto the floor; another time getting out of truck and running board was wet so slid onto the ground  LIVING ENVIRONMENT: Lives with: lives with  their spouse Lives in:  House/apartment Stairs: Yes: Internal: 12 steps; on right going up and External: 3 steps; bilateral but cannot reach both Has following equipment at home: Quad cane large base, Wheelchair (manual), and bed side commode  Tried going up stairs inside home about 8 months ago and fell down the stairs, stairs are carpeted.  PLOF: Independent with gait, Independent with transfers, and Requires assistive device for independence  PATIENT GOALS: "putting on my sock and my shoe myself, working on not dragging my L foot when walking or when on the stairs, work on being able to safely walk down the stairs" "to walk without the brace"  OBJECTIVE:   DIAGNOSTIC FINDINGS:   Brain MRI 04/21/2021 IMPRESSION: 1. No emergent finding. 2. Sequela of large remote right MCA territory infarct.  COGNITION: Overall cognitive status: Within functional limits for tasks assessed   SENSATION: Decreased in L hemibody  POSTURE: rounded shoulders, forward head, and posterior pelvic tilt  VITALS: There were no vitals filed for this visit.   TODAY'S TREATMENT:                                                                                                                               NMR  In // bars with RUE support to work on safely descending stairs: 4" step-downs with LLE x 10 reps 6" step-ups and downs x 4 reps to fatigue  Gait  STAIRS:  Level of Assistance: Min A  Stair Negotiation Technique: Step to Pattern with Single Rail on Right  Number of Stairs: 4 x 3 reps   Height of Stairs: 6"  Comments: needs cues to fully place RLE on step when ascending  Demonstrated how to ascend stairs laterally with use of R handrail, pt unsafe to attempt this date due to LLE weakness and impaired balance, can further assess in future therapy sessions.   PATIENT EDUCATION: Education details: continue HEP, practicing wider BOS w/standing tasks, PT POC with handout of new schedule provided  Person educated:  Patient Education method: Explanation, Demonstration, Tactile cues, and Verbal cues Education comprehension: verbalized understanding and needs further education  HOME EXERCISE PROGRAM: Access Code: [email or web address] URL: [email or web address] Date: 01/30/2023 Prepared by: Excell Seltzer  - Seated Hamstring Stretch  - 1 x daily - 7 x weekly - 1 sets - 10 reps - 30 sec hold   GOALS: Goals reviewed with patient? Yes  SHORT TERM GOALS=LONG TERM GOALS due to length of POC   LONG TERM GOALS: Target date: 03/09/2023 (updated to match last scheduled appt within cert)  Pt will be independent with final HEP for improved strength, balance, transfers and gait. Baseline:  Goal status: INITIAL  2.  Pt will improve 5 x STS to less than or equal to 25 seconds to demonstrate improved functional strength and transfer efficiency.  Baseline: 29.47 sec (3/4) Goal status: INITIAL  3.  Pt will improve gait velocity to at least 1.0 ft/sec for improved  gait efficiency and performance at mod I level  Baseline: 0.7 ft/sec with LBQC (3/4) Goal status: INITIAL  4.  Pt will improve normal TUG to less than or equal to 32 seconds for improved functional mobility and decreased fall risk. Baseline: 37.85 sec with LBQC (3/4) Goal status: INITIAL   ASSESSMENT:  CLINICAL IMPRESSION: Emphasis of skilled PT session on working on mechanics needed to safely descend stairs as pt reports continued difficulties with this at home. Initiated in // bars with transition to work on ascending/descending stairs in clinic. Pt with heavy RUE reliance on rail when ascending and descending but pt only has R handrail available when ascending stairs at home. Demonstrated how to descend stairs laterally but pt unsafe to attempt this session, may attempt in future sessions. Pt continues to benefit from skilled therapy services to work towards [place]. Continue POC.    OBJECTIVE IMPAIRMENTS: Abnormal gait, decreased balance,  decreased coordination, decreased endurance, decreased mobility, difficulty walking, decreased ROM, decreased strength, impaired perceived functional ability, impaired flexibility, impaired tone, and impaired UE functional use.   ACTIVITY LIMITATIONS: carrying, lifting, bending, squatting, stairs, bed mobility, bathing, dressing, reach over head, and hygiene/grooming  PARTICIPATION LIMITATIONS: meal prep, cleaning, laundry, and community activity  PERSONAL FACTORS: Time since onset of injury/illness/exacerbation and 1-2 comorbidities:    R CVA s/p hemicaniectomy 07/19/19 with abdominal flap implant.  PMH: hx of seizures and headaches s/p CVA, hx of DVT with IVC filter placement are also affecting patient's functional outcome.   REHAB POTENTIAL: Fair time since onset, chronic CVA  CLINICAL DECISION MAKING: Stable/uncomplicated  EVALUATION COMPLEXITY: Low  PLAN:  PT FREQUENCY: 2x/week  PT DURATION: 6 weeks  PLANNED INTERVENTIONS: Therapeutic exercises, Therapeutic activity, Neuromuscular re-education, Balance training, Gait training, Patient/Family education, Self Care, Joint mobilization, Stair training, Visual/preceptual remediation/compensation, Orthotic/Fit training, DME instructions, Aquatic Therapy, Dry Needling, Electrical stimulation, Wheelchair mobility training, Cryotherapy, Moist heat, Taping, Manual therapy, and Re-evaluation  PLAN FOR NEXT SESSION: how is HEP? Work on wider BOS in stance. Add to HEP PRN. Pt needs lots of encouragement, does not like to "fail", continue to work on increasing weight shift to L side with standing tasks, LLE stretching as able, practicing ascending/descending stairs (lateral descent if safe and able)     Excell Seltzer, PT, DPT, CSRS 02/19/2023, 2:46 PM

## 2023-02-23 ENCOUNTER — Encounter: Payer: Self-pay | Admitting: Physical Therapy

## 2023-02-23 ENCOUNTER — Ambulatory Visit: Payer: Medicare Other | Attending: Medical | Admitting: Physical Therapy

## 2023-02-23 DIAGNOSIS — R293 Abnormal posture: Secondary | ICD-10-CM | POA: Insufficient documentation

## 2023-02-23 DIAGNOSIS — I69354 Hemiplegia and hemiparesis following cerebral infarction affecting left non-dominant side: Secondary | ICD-10-CM | POA: Diagnosis present

## 2023-02-23 DIAGNOSIS — R2681 Unsteadiness on feet: Secondary | ICD-10-CM | POA: Diagnosis present

## 2023-02-23 DIAGNOSIS — R2689 Other abnormalities of gait and mobility: Secondary | ICD-10-CM | POA: Insufficient documentation

## 2023-02-23 DIAGNOSIS — M6281 Muscle weakness (generalized): Secondary | ICD-10-CM | POA: Insufficient documentation

## 2023-02-23 NOTE — Therapy (Signed)
OUTPATIENT PHYSICAL THERAPY NEURO TREATMENT/AQUATIC THERAPY   Patient Name: Matthew Black MRN: [id number] DOB:09/01/1976, 47 y.o., male 46 Date: 02/23/2023   PCP: Mackie Pai, PA-C REFERRING PROVIDER: Izora Ribas, MD  END OF SESSION:  PT End of Session - 02/23/23 1849     Visit Number 9    Number of Visits 13   with eval   Date for PT Re-Evaluation 03/23/23    Authorization Type Medicare    PT Start Time 1515    PT Stop Time 1615    PT Time Calculation (min) 60 min    Equipment Utilized During Treatment Other (comment)   floatation belt, large single bar bell, aquatic cuff   Activity Tolerance Patient tolerated treatment well    Behavior During Therapy [facility] for tasks assessed/performed                   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   Depression    DVT (deep venous thrombosis)    BLE DVT 07/21/19, 08/02/19; s/p retrievable IVC filter 07/21/19   Hemorrhagic stroke    History of kidney stones    Hypertension    Paralysis    LEFT SIDE   PE (pulmonary thromboembolism)    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   Stroke    RICA, R A1, R MCA occlusion 07/19/19   Past Surgical History:  Procedure Laterality Date   CRANIOPLASTY Right 06/05/2020   Procedure: CRANIOPLASTY;  Surgeon: Consuella Lose, MD;  Location: [place];  Service: Neurosurgery;  Laterality: Right;  right   CRANIOTOMY Right 07/19/2019   Procedure: RIGHT HEMI-CRANIECTOMY With implantation of skull flap to abdominal wall;  Surgeon: Consuella Lose, MD;  Location: [place];  Service: Neurosurgery;  Laterality: Right;   CYST EXCISION N/A 10/08/2016   Procedure: EXCISION OF POSTERIOR NECK CYST;  Surgeon: Clovis Riley, MD;  Location: [facility];  Service: General;  Laterality: N/A;   CYSTOSCOPY/URETEROSCOPY/HOLMIUM LASER/STENT PLACEMENT Right 03/16/2020   Procedure: CYSTOSCOPY RIGHT RETROGRADE PYELOGRAM URETEROSCOPY/HOLMIUM  LASER/STENT PLACEMENT;  Surgeon: Lucas Mallow, MD;  Location: [facility];  Service: Urology;  Laterality: Right;   INCISION AND DRAINAGE ABSCESS N/A 09/22/2014   Procedure: INCISION AND DRAINAGE ABSCESS POSTERIOR NECK;  Surgeon: Pedro Earls, MD;  Location[facility];  Service: General;  Laterality: N/A;   INCISION AND DRAINAGE ABSCESS N/A 12/20/2015   Procedure: INCISION AND DRAINAGE POSTERIOR NECK MASS;  Surgeon: Armandina Gemma, MD;  Location[facility];  Service: General;  Laterality: N/A;   INCISION AND DRAINAGE ABSCESS Left 07/10/2004   middle finger   IR IVC FILTER PLMT / S&I /IMG GUID/MOD SED  07/21/2019   IR RADIOLOGIST EVAL & MGMT  12/14/2019   IR RADIOLOGIST EVAL & MGMT  12/21/2020   IR VENOGRAM RENAL UNI RIGHT  07/21/2019   MASS EXCISION N/A 07/21/2017   Procedure: EXCISION OF BENIGN NECK LESION WITH LAYERED CLOSURE;  Surgeon: Irene Limbo, MD;  Location: [place];  Service: Plastics;  Laterality: N/A;   MASS EXCISION N/A 11/10/2017   Procedure: EXCISION BENIGN LESION OF THE NECK WITH LAYERED CLOSURE;  Surgeon: Irene Limbo, MD;  Location: [place];  Service: Plastics;  Laterality: N/A;   Patient Active Problem List   Diagnosis Date Noted   COVID-19 virus infection 04/21/2021   Left-sided weakness 04/21/2021   S/P craniotomy 06/05/2020   History of cranioplasty 06/05/2020   Peri-rectal abscess 02/14/2020  Abnormal CT scan, pelvis 02/14/2020   Pancytopenia 12/02/2019   Nephrolithiasis 12/02/2019   Hydronephrosis with renal and ureteral calculus obstruction 12/02/2019   Rectal pain 12/02/2019   Hyperkalemia 12/02/2019   Reactive depression    Wound infection after surgery    Sleep disturbance    Dysphagia, post-stroke    Transaminitis    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   Cerebral abscess    Urinary tract infection without hematuria    Altered mental status    Primary hypercoagulable state    Acute pulmonary embolism  without acute cor pulmonale    Deep vein thrombosis (DVT) of non-extremity vein    Hypokalemia    Acute blood loss anemia    Leukocytosis    Endotracheal tube present    Acute respiratory failure with hypoxemia    Stroke (cerebrum) 07/19/2019   Pressure injury of skin 07/19/2019   Acute CVA (cerebrovascular accident)    Encephalopathy    Dysphagia    Acute encephalopathy    Essential hypertension    Obesity 03/12/2016   Scalp abscess 12/20/2015   Neck abscess 12/20/2015   Pilonidal cyst 02/08/2013    ONSET DATE: 01/21/2023  REFERRING DIAG: R25.2 (ICD-10-CM) - Spasticity I69.352 (ICD-10-CM) - 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 No complaints of pain today.   Pt accompanied by: self  PERTINENT HISTORY: R CVA s/p hemicaniectomy 07/19/19 with abdominal flap implant.  PMH: hx of seizures and headaches s/p CVA, hx of DVT with IVC filter placement . Pt s/p botox on 01/21/22.  PAIN:  Are you having pain? No  PRECAUTIONS: Fall  WEIGHT BEARING RESTRICTIONS: No  FALLS: Has patient fallen in last 6 months? Yes. Number of falls 2; had to call fire department to get back up, didn't get hurt; one time I was in the wheelchair and I had a sock on and when I leaned over to reach for something I just slid out onto the floor; another time getting out of truck and running board was wet so slid onto the  ground  LIVING  ENVIRONMENT: Lives with: lives with their spouse Lives in: House/apartment Stairs: Yes: Internal: 12 steps; on right going up and External: 3 steps; bilateral but cannot reach both Has following equipment at home: Quad cane large base, Wheelchair (manual), and bed side commode  Tried going up stairs inside home about 8 months ago and fell down the stairs, stairs are carpeted.  PLOF: Independent with gait, Independent with transfers, and Requires assistive device for independence  PATIENT GOALS: "putting on my sock and my shoe myself, working on not dragging my L foot when walking or when on the stairs, work on being able to safely walk down the stairs" "to walk without the brace"  Aquatic therapy: [facility]atic therapy at [facility] - pool temp 90 degrees  Patient seen for aquatic therapy today.  Treatment took place in water 3.6-4.8 feet deep depending upon activity.  Pt entered & exited the pool via step negotiation with use of bil. Hand rails with min to mod assist with descension due to positioning Lt foot in neutral due to increased tone resulting in supination of Lt foot; CGA to min assist with ascension for exiting pool with use of bil. Hand rails, step by step sequence for both ascension & descension.  Pt performed Lt hamstring/gastroc stretch in standing (runner's stretch) 1 rep 20 sec hold Lt hamstring stretch passively  - pt in seated position on bench in pool  Lt step ups onto aquatic step in 3.8' water depth -  10 reps for LLE strengthening  Slow tap ups to aquatic step with RLE for LLE closed chain strengthening 10 reps  Pt performed water walking 18' x 4 reps forwards holding onto large single bar bell; sideways 18' x 4 reps; backwards 18' x 2 reps holding onto bar bell, stabilized by PT  Pt performed standing Lt hip exercises for ROM, stretching and strengthening  - aquatic cuff used for stretching with concentric contraction and strengthening for  the eccentric contraction Lt hip flexion, abduction and extension 10 reps each direction  Squats bil. LE's 10 reps:  LLE unilateral squats 10 reps  Marching in place 10 reps Flotation belt placed on patient - assisted into supine position ; pt performed bicycling LE's approx. 20 reps; hip abduction/adduction 20 reps  Pt performed "jogging" as able - initially in place  - then 18'x 2 reps across pool with floatation belt on for assistance with buoyancy  Pt requires buoyancy of water for support for reduced fall risk and for unloading/reduced stress on joints as pt able to tolerate increased standing and ambulation in water compared to that on land; viscosity of water is needed for resistance for strengthening and current of water provides perturbations for challenge for balance training         PATIENT EDUCATION: Education details: continue HEP, practicing wider BOS w/standing tasks, PT POC with handout of new schedule provided  Person educated: Patient Education method: Explanation, Demonstration, Tactile cues, and Verbal cues Education comprehension: verbalized understanding and needs further education  HOME EXERCISE PROGRAM: Access Code: [email or web address] URL: [email or web address] Date: 01/30/2023 Prepared by: Excell Seltzer  - Seated Hamstring Stretch  - 1 x daily - 7 x weekly - 1 sets - 10 reps - 30 sec hold   GOALS: Goals reviewed with patient? Yes  SHORT TERM GOALS=LONG TERM GOALS due to length of POC   LONG TERM GOALS: Target date: 03/09/2023 (updated to match last scheduled appt within cert)  Pt will be independent with final HEP for improved  strength, balance, transfers and gait. Baseline:  Goal status: INITIAL  2.  Pt will improve 5 x STS to less than or equal to 25 seconds to demonstrate improved functional strength and transfer efficiency.  Baseline: 29.47 sec (3/4) Goal status: INITIAL  3.  Pt will improve gait velocity to at least 1.0 ft/sec for  improved gait efficiency and performance at mod I level  Baseline: 0.7 ft/sec with LBQC (3/4) Goal status: INITIAL  4.  Pt will improve normal TUG to less than or equal to 32 seconds for improved functional mobility and decreased fall risk. Baseline: 37.85 sec with LBQC (3/4) Goal status: INITIAL   ASSESSMENT:  CLINICAL IMPRESSION:   Aquatic PT session focused on LLE strengthening and stretching exercises and water walking in various directions, and also exercises to increase weight shift onto LLE in stance.  Pt required frequent short standing rest breaks during first half of session with less breaks needed as session progressed.  Pt reported mod. Difficulty performing Lt hip exercises with use of aquatic cuff, however, reported feeling much looser after these hip exercises were completed.  Pt tolerated aquatic exercises well.  Continue POC.    OBJECTIVE IMPAIRMENTS: Abnormal gait, decreased balance, decreased coordination, decreased endurance, decreased mobility, difficulty walking, decreased ROM, decreased strength, impaired perceived functional ability, impaired flexibility, impaired tone, and impaired UE functional use.   ACTIVITY LIMITATIONS: carrying, lifting, bending, squatting, stairs, bed mobility, bathing, dressing, reach over head, and hygiene/grooming  PARTICIPATION LIMITATIONS: meal prep, cleaning, laundry, and community activity  PERSONAL FACTORS: Time since onset of injury/illness/exacerbation and 1-2 comorbidities:    R CVA s/p hemicaniectomy 07/19/19 with abdominal flap implant.  PMH: hx of seizures and headaches s/p CVA, hx of DVT with IVC filter placement are also affecting patient's functional outcome.   REHAB POTENTIAL: Fair time since onset, chronic CVA  CLINICAL DECISION MAKING: Stable/uncomplicated  EVALUATION COMPLEXITY: Low  PLAN:  PT FREQUENCY: 2x/week  PT DURATION: 6 weeks  PLANNED INTERVENTIONS: Therapeutic exercises, Therapeutic activity, Neuromuscular  re-education, Balance training, Gait training, Patient/Family education, Self Care, Joint mobilization, Stair training, Visual/preceptual remediation/compensation, Orthotic/Fit training, DME instructions, Aquatic Therapy, Dry Needling, Electrical stimulation, Wheelchair mobility training, Cryotherapy, Moist heat, Taping, Manual therapy, and Re-evaluation  PLAN FOR NEXT SESSION: 10th visit progress note due next session:  how is HEP? Work on wider BOS in stance. Add to HEP PRN. Pt needs lots of encouragement, does not like to "fail", continue to work on increasing weight shift to L side with standing tasks, LLE stretching as able, practicing ascending/descending stairs (lateral descent if safe and able)     Trevante Tennell, [place], PT 02/23/2023, 6:52 PM

## 2023-02-24 ENCOUNTER — Ambulatory Visit: Payer: Medicare Other | Admitting: Physical Therapy

## 2023-02-24 DIAGNOSIS — R2689 Other abnormalities of gait and mobility: Secondary | ICD-10-CM | POA: Diagnosis not present

## 2023-02-24 DIAGNOSIS — I69354 Hemiplegia and hemiparesis following cerebral infarction affecting left non-dominant side: Secondary | ICD-10-CM

## 2023-02-24 DIAGNOSIS — R2681 Unsteadiness on feet: Secondary | ICD-10-CM

## 2023-02-24 DIAGNOSIS — M6281 Muscle weakness (generalized): Secondary | ICD-10-CM

## 2023-02-24 NOTE — Therapy (Signed)
OUTPATIENT PHYSICAL THERAPY NEURO TREATMENT- 10TH VISIT PROGRESS NOTE   Patient Name: Matthew Black MRN: [id number] DOB:[birth year], 47 y.o., male Today's Date: 02/24/2023   PCP: Mackie Pai, PA-C REFERRING PROVIDER: Izora Ribas, MD  Physical Therapy Progress Note   Dates of Reporting Period:01/26/23 - 02/24/23  See Note below for Objective Data and Assessment of Progress/Goals.    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   Depression    DVT (deep venous thrombosis)    BLE DVT 07/21/19, 08/02/19; s/p retrievable IVC filter 07/21/19   Hemorrhagic stroke    History of kidney stones    Hypertension    Paralysis    LEFT SIDE   PE (pulmonary thromboembolism)    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   Stroke    RICA, R A1, R MCA occlusion 07/19/19   Past Surgical History:  Procedure Laterality Date   CRANIOPLASTY Right 06/05/2020   Procedure: CRANIOPLASTY;  Surgeon: Consuella Lose, MD;  Location: [place];  Service: Neurosurgery;  Laterality: Right;  right   CRANIOTOMY Right 07/19/2019   Procedure: RIGHT HEMI-CRANIECTOMY With implantation of skull flap to abdominal wall;  Surgeon: Consuella Lose, MD;  Location: [place];  Service: Neurosurgery;  Laterality: Right;   CYST EXCISION N/A 10/08/2016   Procedure: EXCISION OF POSTERIOR NECK CYST;  Surgeon: Clovis Riley, MD;  [facility];  Service: General;  Laterality: N/A;    CYSTOSCOPY/URETEROSCOPY/HOLMIUM LASER/STENT PLACEMENT Right 03/16/2020   Procedure: CYSTOSCOPY RIGHT RETROGRADE PYELOGRAM URETEROSCOPY/HOLMIUM LASER/STENT PLACEMENT;  Surgeon: Lucas Mallow, MD;  [facility];  Service: Urology;  Laterality: Right;   INCISION AND DRAINAGE ABSCESS N/A 09/22/2014   Procedure: INCISION AND DRAINAGE ABSCESS POSTERIOR NECK;  Surgeon: Pedro Earls, MD;  [facility];  Service: General;  Laterality: N/A;   INCISION AND DRAINAGE ABSCESS N/A 12/20/2015   Procedure: INCISION AND DRAINAGE POSTERIOR NECK MASS;  Surgeon: Armandina Gemma, MD;  [facility];  Service: General;  Laterality: N/A;   INCISION AND DRAINAGE ABSCESS Left 07/10/2004   middle finger   IR IVC FILTER PLMT / S&I /IMG GUID/MOD SED  07/21/2019   IR RADIOLOGIST EVAL & MGMT  12/14/2019   IR RADIOLOGIST EVAL & MGMT  12/21/2020   IR VENOGRAM RENAL UNI RIGHT  07/21/2019   MASS EXCISION N/A 07/21/2017   Procedure: EXCISION OF BENIGN NECK LESION WITH LAYERED CLOSURE;  Surgeon: Irene Limbo, MD;  Location: [place];  Service: Plastics;  Laterality: N/A;   MASS EXCISION N/A 11/10/2017   Procedure: EXCISION BENIGN LESION OF THE NECK WITH LAYERED CLOSURE;  Surgeon: Irene Limbo, MD;  Location: [place];  Service: Plastics;  Laterality: N/A;   Patient Active Problem List   Diagnosis Date Noted  COVID-19 virus infection 04/21/2021   Left-sided weakness 04/21/2021   S/P craniotomy 06/05/2020   History of cranioplasty 06/05/2020   Peri-rectal abscess 02/14/2020   Abnormal CT scan, pelvis 02/14/2020   Pancytopenia 12/02/2019   Nephrolithiasis 12/02/2019   Hydronephrosis with renal and ureteral calculus obstruction 12/02/2019   Rectal pain 12/02/2019   Hyperkalemia 12/02/2019   Reactive depression    Wound infection after surgery    Sleep disturbance    Dysphagia, post-stroke    Transaminitis    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   Cerebral  abscess    Urinary tract infection without hematuria    Altered mental status    Primary hypercoagulable state    Acute pulmonary embolism without acute cor pulmonale    Deep vein thrombosis (DVT) of non-extremity vein    Hypokalemia    Acute blood loss anemia    Leukocytosis    Endotracheal tube present    Acute respiratory failure with hypoxemia    Stroke (cerebrum) 07/19/2019   Pressure injury of skin 07/19/2019   Acute CVA (cerebrovascular accident)    Encephalopathy    Dysphagia    Acute encephalopathy    Essential hypertension    Obesity 03/12/2016   Scalp abscess 12/20/2015   Neck abscess 12/20/2015   Pilonidal cyst 02/08/2013    ONSET DATE: 01/21/2023  REFERRING DIAG: R25.2 (ICD-10-CM) - Spasticity I69.352 (ICD-10-CM) - 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 Denies pain or acute changes   Pt accompanied by: self  PERTINENT HISTORY: R CVA s/p hemicaniectomy 07/19/19 with abdominal flap implant.  PMH: hx of seizures and headaches s/p CVA, hx of DVT with IVC filter placement . Pt s/p botox on 01/21/22.  PAIN:  Are you having pain? No  PRECAUTIONS: Fall  WEIGHT BEARING RESTRICTIONS: No  FALLS: Has patient fallen in last 6 months? Yes. Number of falls 2; had to call fire department to get back up, didn't get hurt; one time I was in the  wheelchair and I had a sock on and  when I leaned over to reach for something I just slid out onto the floor; another time getting out of truck and running board was wet so slid onto the ground  LIVING ENVIRONMENT: Lives with: lives with their spouse Lives in: House/apartment Stairs: Yes: Internal: 12 steps; on right going up and External: 3 steps; bilateral but cannot reach both Has following equipment at home: Quad cane large base, Wheelchair (manual), and bed side commode  Tried going up stairs inside home about 8 months ago and fell down the stairs, stairs are carpeted.  PLOF: Independent with gait, Independent with transfers, and Requires assistive device for independence  PATIENT GOALS: "putting on my sock and my shoe myself, working on not dragging my L foot when walking or when on the stairs, work on being able to safely walk down the stairs" "to walk without the brace"  OBJECTIVE  TODAY'S TREATMENT NMR  Pt ambulated from mat table to // bars w/LBQC and CGA, requested seated rest break upon reaching // bars due to fatigue.  In // bars for improved lateral weight shifting and priming for stair training:  LLE lateral toe taps to 6" steps (to imitate home setup and lateral ascension) w/RUE support on rail, 3x6 reps w/CGA. Pt required several attempts to bring leg onto step and demonstrated significant ER of LLE on step, unable to correct foot placement despite max A. Pt required frequent seated rest breaks due to fatigue. At this time, deemed unsafe for pt to attempt to ascend stairs in lateral direction, but may try ascending fwd and descending laterally next session.  Alt fwd toe taps to 6" step w/RUE support and min A to facilitate lateral weight shifting, x10 reps. Pt relied heavily on circumduction compensation w/LLE and required cues to shift to LLE to tap w/RLE. Pt requesting to go get gatorade due to fatigue, so ended session.  Walked pt out of clinic w/S* due to high fatigue levels       PATIENT EDUCATION: Education details: continue HEP, next appointment time  Person educated: Patient Education method: Explanation, Demonstration, Tactile cues, and Verbal cues Education comprehension: verbalized understanding and needs further education  HOME EXERCISE PROGRAM: Access Code: [email or web address] URL: [email or web address] Date: 01/30/2023 Prepared by: Excell Seltzer  - Seated Hamstring Stretch  - 1 x daily - 7 x weekly - 1 sets - 10 reps - 30 sec hold   GOALS: Goals reviewed with patient? Yes  SHORT TERM GOALS=LONG TERM GOALS due to length of POC   LONG TERM GOALS: Target date: 03/09/2023 (updated to match last scheduled appt within cert)  Pt will be independent with final HEP for improved strength, balance, transfers and gait. Baseline:  Goal status: INITIAL  2.  Pt will improve 5 x STS to less than or equal to 25 seconds to demonstrate improved functional strength and transfer efficiency.  Baseline: 29.47 sec (3/4) Goal status: INITIAL  3.  Pt will improve gait velocity to at least 1.0 ft/sec for improved gait efficiency and performance at mod I level  Baseline: 0.7 ft/sec with LBQC (3/4) Goal status: INITIAL  4.  Pt will improve normal TUG to less than or equal to 32 seconds for improved functional mobility and decreased fall risk. Baseline: 37.85 sec with LBQC (3/4) Goal status: INITIAL   ASSESSMENT:  CLINICAL IMPRESSION: Session limited as pt extremely fatigued from aquatic session day prior. Emphasis on skilled PT session on lateral weight shifting and priming for stair training. Pt required several seated rest breaks  throughout session due to fatigue and at this time, not safe for pt to attempt stair navigation as he can only use single rail and has significant tone in LLE. Pt has improved with ability to weight shift to LLE this date but continues to require max encouragement cues to reduce frustration w/tasks. Continue POC.    OBJECTIVE  IMPAIRMENTS: Abnormal gait, decreased balance, decreased coordination, decreased endurance, decreased mobility, difficulty walking, decreased ROM, decreased strength, impaired perceived functional ability, impaired flexibility, impaired tone, and impaired UE functional use.   ACTIVITY LIMITATIONS: carrying, lifting, bending, squatting, stairs, bed mobility, bathing, dressing, reach over head, and hygiene/grooming  PARTICIPATION LIMITATIONS: meal prep, cleaning, laundry, and community activity  PERSONAL FACTORS: Time since onset of injury/illness/exacerbation and 1-2 comorbidities:    R CVA s/p hemicaniectomy 07/19/19 with abdominal flap implant.  PMH: hx of seizures and headaches s/p CVA, hx of DVT with IVC filter placement are also affecting patient's functional outcome.   REHAB POTENTIAL: Fair time since onset, chronic CVA  CLINICAL DECISION MAKING: Stable/uncomplicated  EVALUATION COMPLEXITY: Low  PLAN:  PT FREQUENCY: 2x/week  PT DURATION: 6 weeks  PLANNED INTERVENTIONS: Therapeutic exercises, Therapeutic activity, Neuromuscular re-education, Balance training, Gait training, Patient/Family education, Self Care, Joint mobilization, Stair training, Visual/preceptual remediation/compensation, Orthotic/Fit training, DME instructions, Aquatic Therapy, Dry Needling, Electrical stimulation, Wheelchair mobility training, Cryotherapy, Moist heat, Taping, Manual therapy, and Re-evaluation  PLAN FOR NEXT SESSION: how is HEP? Work on wider BOS in stance. Add to HEP PRN. Pt needs lots of encouragement, does not like to "fail", continue to work on increasing weight shift to L side with standing tasks, LLE stretching as able,      Cruzita Lederer Taylynn Easton, PT, DPT 02/24/2023, 3:27 PM

## 2023-02-26 ENCOUNTER — Ambulatory Visit: Payer: Medicare Other | Admitting: Physical Therapy

## 2023-03-02 ENCOUNTER — Ambulatory Visit: Payer: Medicare Other | Admitting: Physical Therapy

## 2023-03-02 ENCOUNTER — Encounter: Payer: Self-pay | Admitting: Physical Therapy

## 2023-03-02 DIAGNOSIS — I69354 Hemiplegia and hemiparesis following cerebral infarction affecting left non-dominant side: Secondary | ICD-10-CM

## 2023-03-02 DIAGNOSIS — R2689 Other abnormalities of gait and mobility: Secondary | ICD-10-CM

## 2023-03-02 DIAGNOSIS — R2681 Unsteadiness on feet: Secondary | ICD-10-CM

## 2023-03-02 NOTE — Therapy (Signed)
OUTPATIENT PHYSICAL THERAPY NEURO TREATMENT/AQUATIC THERAPY   Patient Name: Matthew Black MRN: [id number] DOB:02/16/1976, 47 y.o., male Today's Date: 03/03/2023   PCP: Esperanza RichtersSaguier, Edward, PA-C REFERRING PROVIDER: Horton Chinaulkar, Krutika P, MD  END OF SESSION:  PT End of Session - 03/02/23 2020     Visit Number 11    Number of Visits 13   with eval   Date for PT Re-Evaluation 03/23/23    Authorization Type Medicare    PT Start Time 1520    PT Stop Time 1610    PT Time Calculation (min) 50 min    Equipment Utilized During Treatment Other (comment)   aquatic cuffs, bar bells   Activity Tolerance Patient tolerated treatment well    [facility] for tasks assessed/performed                   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   Depression    DVT (deep venous thrombosis)    BLE DVT 07/21/19, 08/02/19; s/p retrievable IVC filter 07/21/19   Hemorrhagic stroke    History of kidney stones    Hypertension    Paralysis    LEFT SIDE   PE (pulmonary thromboembolism)    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   Stroke    RICA, R A1, R MCA occlusion 07/19/19   Past Surgical History:  Procedure Laterality Date   CRANIOPLASTY Right 06/05/2020   Procedure: CRANIOPLASTY;  Surgeon: Lisbeth RenshawNundkumar, Neelesh, MD;  Location: [facility] OR;  Service: Neurosurgery;  Laterality: Right;  right   CRANIOTOMY Right 07/19/2019   Procedure: RIGHT HEMI-CRANIECTOMY With implantation of skull flap to abdominal wall;  Surgeon: Lisbeth RenshawNundkumar, Neelesh, MD;  [facility];  Service: Neurosurgery;  Laterality: Right;   CYST EXCISION N/A 10/08/2016   Procedure: EXCISION OF POSTERIOR NECK CYST;  Surgeon: Berna Buehelsea A Connor, MD;  Location: [facility]S;  Service: General;  Laterality: N/A;   CYSTOSCOPY/URETEROSCOPY/HOLMIUM LASER/STENT PLACEMENT Right 03/16/2020   Procedure: CYSTOSCOPY RIGHT RETROGRADE PYELOGRAM URETEROSCOPY/HOLMIUM LASER/STENT PLACEMENT;   Surgeon: Crista ElliotBell, Eugene D III, MD;  Location: [facility];  Service: Urology;  Laterality: Right;   INCISION AND DRAINAGE ABSCESS N/A 09/22/2014   Procedure: INCISION AND DRAINAGE ABSCESS POSTERIOR NECK;  Surgeon: Valarie MerinoMatthew B Martin, MD;  Location: [facility];  Service: General;  Laterality: N/A;   INCISION AND DRAINAGE ABSCESS N/A 12/20/2015   Procedure: INCISION AND DRAINAGE POSTERIOR NECK MASS;  Surgeon: Darnell Levelodd Gerkin, MD;  Location: [facility];  Service: General;  Laterality: N/A;   INCISION AND DRAINAGE ABSCESS Left 07/10/2004   middle finger   IR IVC FILTER PLMT / S&I /IMG GUID/MOD SED  07/21/2019   IR RADIOLOGIST EVAL & MGMT  12/14/2019   IR RADIOLOGIST EVAL & MGMT  12/21/2020   IR VENOGRAM RENAL UNI RIGHT  07/21/2019   MASS EXCISION N/A 07/21/2017   Procedure: EXCISION OF BENIGN NECK LESION WITH LAYERED CLOSURE;  Surgeon: Glenna Fellowshimmappa, Brinda, MD;  Location: [facility];  Service: Plastics;  Laterality: N/A;   MASS EXCISION N/A 11/10/2017   Procedure: EXCISION BENIGN LESION OF THE NECK WITH LAYERED CLOSURE;  Surgeon: Glenna Fellowshimmappa, Brinda, MD;  Location: [facility];  Service: Plastics;  Laterality: N/A;   Patient Active Problem List   Diagnosis Date Noted   COVID-19 virus infection 04/21/2021   Left-sided weakness 04/21/2021   S/P craniotomy 06/05/2020   History of cranioplasty 06/05/2020   Peri-rectal abscess 02/14/2020   Abnormal CT  scan, pelvis 02/14/2020   Pancytopenia 12/02/2019   Nephrolithiasis 12/02/2019   Hydronephrosis with renal and ureteral calculus obstruction 12/02/2019   Rectal pain 12/02/2019   Hyperkalemia 12/02/2019   Reactive depression    Wound infection after surgery    Sleep disturbance    Dysphagia, post-stroke    Transaminitis    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   Cerebral abscess    Urinary tract infection without hematuria    Altered mental status    Primary hypercoagulable state    Acute pulmonary embolism without acute cor  pulmonale    Deep vein thrombosis (DVT) of non-extremity vein    Hypokalemia    Acute blood loss anemia    Leukocytosis    Endotracheal tube present    Acute respiratory failure with hypoxemia    Stroke (cerebrum) 07/19/2019   Pressure injury of skin 07/19/2019   Acute CVA (cerebrovascular accident)    Encephalopathy    Dysphagia    Acute encephalopathy    Essential hypertension    Obesity 03/12/2016   Scalp abscess 12/20/2015   Neck abscess 12/20/2015   Pilonidal cyst 02/08/2013    ONSET DATE: 01/21/2023  REFERRING DIAG: R25.2 (ICD-10-CM) - Spasticity I69.352 (ICD-10-CM) - 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 (HCC)  THERAPY DIAG:  Other abnormalities of gait and mobility  Unsteadiness on feet  Hemiplegia and hemiparesis following cerebral infarction affecting left non-dominant side  Rationale for Evaluation and Treatment: Rehabilitation  SUBJECTIVE:                                                                                                                                                                                             SUBJECTIVE STATEMENT: Pt reports he ate today before coming to pool - feels better today than he did last week    Pt accompanied by: self  PERTINENT HISTORY: R CVA s/p hemicaniectomy 07/19/19 with abdominal flap implant.  PMH: hx of seizures and headaches s/p CVA, hx of DVT with IVC filter placement . Pt s/p botox on 01/21/22.  PAIN:  Are you having pain? No  PRECAUTIONS: Fall  WEIGHT BEARING RESTRICTIONS: No  FALLS: Has patient fallen in last 6 months? Yes. Number of falls 2; had to call fire department to get back up, didn't get hurt; one time I was in the wheelchair and I had a sock on and when I leaned over to reach for something I just slid out onto the floor; another time getting out of truck and running board was wet so slid onto the ground  LIVING ENVIRONMENT: Lives with: lives  with  their spouse Lives in: House/apartment Stairs: Yes: Internal: 12 steps; on right going up and External: 3 steps; bilateral but cannot reach both Has following equipment at home: Quad cane large base, Wheelchair (manual), and bed side commode  Tried going up stairs inside home about 8 months ago and fell down the stairs, stairs are carpeted.  PLOF: Independent with gait, Independent with transfers, and Requires assistive device for independence  PATIENT GOALS: "putting on my sock and my shoe myself, working on not dragging my L foot when walking or when on the stairs, work on being able to safely walk down the stairs" "to walk without the brace"  Aquatic therapy: Aquatic therapy at [facility] - pool temp 90 degrees  Patient seen for aquatic therapy today.  Treatment took place in water 3.6-4.8 feet deep depending upon activity.  Pt entered & exited the pool via step negotiation with use of bil. Hand rails with min assist with descension due to positioning Lt foot in neutral due to increased tone resulting in supination of Lt foot; CGA to min assist with ascension for exiting pool with use of bil. Hand rails, step by step sequence for both ascension & descension.  Pt performed Lt hamstring/gastroc stretch in standing (runner's stretch) 1 rep 20 sec hold  Lt hamstring stretching in standing - pt placed Lt foot on 3rd step with min assist for positioning - 30 sec hold x 1 rep  Pt performed stepping with RLE - forward/back, out/in and back/forward 5 reps each direction with pt holding bar bells for assist with balance  Pt performed water walking 18' x 6 reps forwards without UE support on any flotation device:  sideways 18' x 4 reps; backwards 18' x 2 reps holding onto bar bell, stabilized by PT  Pt performed standing Lt hip exercises for ROM, stretching and strengthening  -  2 aquatic cuffs placed on LLE for stretching with concentric contraction and strengthening for the eccentric  contraction Lt hip flexion, abduction and extension 10 reps each direction  Squats bil. LE's 10 reps:  LLE unilateral squats 10 reps  Marching in place 10 reps without UE support  Pt performed "jogging" as able - 18'x 2 reps across pool with floatation belt on for assistance with buoyancy  Pt requires buoyancy of water for support for reduced fall risk and for unloading/reduced stress on joints as pt able to tolerate increased standing and ambulation in water compared to that on land; viscosity of water is needed for resistance for strengthening and current of water provides perturbations for challenge for balance training         PATIENT EDUCATION: Education details: continue HEP, practicing wider BOS w/standing tasks, PT POC with handout of new schedule provided  Person educated: Patient Education method: Explanation, Demonstration, Tactile cues, and Verbal cues Education comprehension: verbalized understanding and needs further education  HOME EXERCISE PROGRAM: Access Code: [email or web address] URL: [email or web address] Date: 01/30/2023 Prepared by: Peter Congo  - Seated Hamstring Stretch  - 1 x daily - 7 x weekly - 1 sets - 10 reps - 30 sec hold   GOALS: Goals reviewed with patient? Yes  SHORT TERM GOALS=LONG TERM GOALS due to length of POC   LONG TERM GOALS: Target date: 03/09/2023 (updated to match last scheduled appt within cert)  Pt will be independent with final HEP for improved strength, balance, transfers and gait. Baseline:  Goal status: INITIAL  2.  Pt will improve 5 x STS to less than  or equal to 25 seconds to demonstrate improved functional strength and transfer efficiency.  Baseline: 29.47 sec (3/4) Goal status: INITIAL  3.  Pt will improve gait velocity to at least 1.0 ft/sec for improved gait efficiency and performance at mod I level  Baseline: 0.7 ft/sec with LBQC (3/4) Goal status: INITIAL  4.  Pt will improve normal TUG to less than or equal  to 32 seconds for improved functional mobility and decreased fall risk. Baseline: 37.85 sec with LBQC (3/4) Goal status: INITIAL   ASSESSMENT:  CLINICAL IMPRESSION: Aquatic PT session focused on LLE strengthening and stretching exercises and water walking in various directions.  Pt had difficulty performing active Lt hip extension due to Lt hip flexor tightness; limited PROM Lt hip extension noted also due to Lt hip flexor tightness.   Pt had increased activity tolerance in today's session compared to that in previous session last week, with pt reporting he ate prior to coming to aquatic PT session, whereas he had not eaten prior to last week's session.  Continue POC.    OBJECTIVE IMPAIRMENTS: Abnormal gait, decreased balance, decreased coordination, decreased endurance, decreased mobility, difficulty walking, decreased ROM, decreased strength, impaired perceived functional ability, impaired flexibility, impaired tone, and impaired UE functional use.   ACTIVITY LIMITATIONS: carrying, lifting, bending, squatting, stairs, bed mobility, bathing, dressing, reach over head, and hygiene/grooming  PARTICIPATION LIMITATIONS: meal prep, cleaning, laundry, and community activity  PERSONAL FACTORS: Time since onset of injury/illness/exacerbation and 1-2 comorbidities:    R CVA s/p hemicaniectomy 07/19/19 with abdominal flap implant.  PMH: hx of seizures and headaches s/p CVA, hx of DVT with IVC filter placement are also affecting patient's functional outcome.   REHAB POTENTIAL: Fair time since onset, chronic CVA  CLINICAL DECISION MAKING: Stable/uncomplicated  EVALUATION COMPLEXITY: Low  PLAN:  PT FREQUENCY: 2x/week  PT DURATION: 6 weeks  PLANNED INTERVENTIONS: Therapeutic exercises, Therapeutic activity, Neuromuscular re-education, Balance training, Gait training, Patient/Family education, Self Care, Joint mobilization, Stair training, Visual/preceptual remediation/compensation, Orthotic/Fit  training, DME instructions, Aquatic Therapy, Dry Needling, Electrical stimulation, Wheelchair mobility training, Cryotherapy, Moist heat, Taping, Manual therapy, and Re-evaluation  PLAN FOR NEXT SESSION: how is HEP? Work on wider BOS in stance. Add to HEP PRN. Pt needs lots of encouragement, does not like to "fail", continue to work on increasing weight shift to L side with standing tasks, LLE stretching as able, practicing ascending/descending stairs (lateral descent if safe and able)     Askari Kinley, Donavan Burnet, PT 03/03/2023, 8:05 AM

## 2023-03-03 ENCOUNTER — Ambulatory Visit: Payer: Self-pay | Admitting: Physical Therapy

## 2023-03-06 ENCOUNTER — Ambulatory Visit: Payer: Medicare Other | Admitting: Physical Therapy

## 2023-03-06 DIAGNOSIS — M6281 Muscle weakness (generalized): Secondary | ICD-10-CM

## 2023-03-06 DIAGNOSIS — R2689 Other abnormalities of gait and mobility: Secondary | ICD-10-CM

## 2023-03-06 DIAGNOSIS — R293 Abnormal posture: Secondary | ICD-10-CM

## 2023-03-06 DIAGNOSIS — R2681 Unsteadiness on feet: Secondary | ICD-10-CM

## 2023-03-06 NOTE — Therapy (Addendum)
OUTPATIENT PHYSICAL THERAPY NEURO TREATMENT   Patient Name: Matthew Black MRN: [id number] DOB:10/11/1976, 47 y.o., male Today's Date: 03/06/2023   PCP: Esperanza Richters, PA-C REFERRING PROVIDER: Horton Chin, MD  END OF SESSION:  PT End of Session - 03/06/23 1408     Visit Number 12    Number of Visits 13   with eval   Date for PT Re-Evaluation 03/23/23    Authorization Type Medicare    PT Start Time 1405    PT Stop Time 1445    PT Time Calculation (min) 40 min    Equipment Utilized During Treatment Gait belt    Activity Tolerance Patient tolerated treatment well    Behavior During Therapy [facility] for tasks assessed/performed                    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   Depression    DVT (deep venous thrombosis)    BLE DVT 07/21/19, 08/02/19; s/p retrievable IVC filter 07/21/19   Hemorrhagic stroke    History of kidney stones    Hypertension    Paralysis    LEFT SIDE   PE (pulmonary thromboembolism)    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   Stroke    RICA, R A1, R MCA occlusion 07/19/19   Past Surgical History:  Procedure Laterality Date   CRANIOPLASTY Right 06/05/2020   Procedure: CRANIOPLASTY;  Surgeon: Lisbeth Renshaw, MD;  Location: [facility] OR;  Service: Neurosurgery;  Laterality: Right;  right   CRANIOTOMY Right 07/19/2019   Procedure: RIGHT HEMI-CRANIECTOMY With implantation of skull flap to abdominal wall;  Surgeon: Lisbeth Renshaw, MD;  Location: [facility] OR;  Service: Neurosurgery;  Laterality: Right;   CYST EXCISION N/A 10/08/2016   Procedure: EXCISION OF POSTERIOR NECK CYST;  Surgeon: Berna Bue, MD;  Location: [facility];  Service: General;  Laterality: N/A;   CYSTOSCOPY/URETEROSCOPY/HOLMIUM LASER/STENT PLACEMENT Right 03/16/2020   Procedure: CYSTOSCOPY RIGHT RETROGRADE PYELOGRAM URETEROSCOPY/HOLMIUM LASER/STENT PLACEMENT;  Surgeon: Crista Elliot, MD;  Location: [facility];   Service: Urology;  Laterality: Right;   INCISION AND DRAINAGE ABSCESS N/A 09/22/2014   Procedure: INCISION AND DRAINAGE ABSCESS POSTERIOR NECK;  Surgeon: Valarie Merino, MD;  Location: [facility];  Service: General;  Laterality: N/A;   INCISION AND DRAINAGE ABSCESS N/A 12/20/2015   Procedure: INCISION AND DRAINAGE POSTERIOR NECK MASS;  Surgeon: Darnell Level, MD;  Location: [facility];  Service: General;  Laterality: N/A;   INCISION AND DRAINAGE ABSCESS Left 07/10/2004   middle finger   IR IVC FILTER PLMT / S&I /IMG GUID/MOD SED  07/21/2019   IR RADIOLOGIST EVAL & MGMT  12/14/2019   IR RADIOLOGIST EVAL & MGMT  12/21/2020   IR VENOGRAM RENAL UNI RIGHT  07/21/2019   MASS EXCISION N/A 07/21/2017   Procedure: EXCISION OF BENIGN NECK LESION WITH LAYERED CLOSURE;  Surgeon: Glenna Fellows, MD;  Location: [facility];  Service: Plastics;  Laterality: N/A;   MASS EXCISION N/A 11/10/2017   Procedure: EXCISION BENIGN LESION OF THE NECK WITH LAYERED CLOSURE;  Surgeon: Glenna Fellows, MD;  Location: [facility];  Service: Plastics;  Laterality: N/A;   Patient Active Problem List   Diagnosis Date Noted   COVID-19 virus infection 04/21/2021   Left-sided weakness 04/21/2021   S/P craniotomy 06/05/2020   History of cranioplasty 06/05/2020   Peri-rectal abscess 02/14/2020   Abnormal CT scan, pelvis 02/14/2020  Pancytopenia 12/02/2019   Nephrolithiasis 12/02/2019   Hydronephrosis with renal and ureteral calculus obstruction 12/02/2019   Rectal pain 12/02/2019   Hyperkalemia 12/02/2019   Reactive depression    Wound infection after surgery    Sleep disturbance    Dysphagia, post-stroke    Transaminitis    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   Cerebral abscess    Urinary tract infection without hematuria    Altered mental status    Primary hypercoagulable state    Acute pulmonary embolism without acute cor pulmonale    Deep vein thrombosis (DVT) of non-extremity  vein    Hypokalemia    Acute blood loss anemia    Leukocytosis    Endotracheal tube present    Acute respiratory failure with hypoxemia    Stroke (cerebrum) 07/19/2019   Pressure injury of skin 07/19/2019   Acute CVA (cerebrovascular accident)    Encephalopathy    Dysphagia    Acute encephalopathy    Essential hypertension    Obesity 03/12/2016   Scalp abscess 12/20/2015   Neck abscess 12/20/2015   Pilonidal cyst 02/08/2013    ONSET DATE: 01/21/2023  REFERRING DIAG: R25.2 (ICD-10-CM) - Spasticity I69.352 (ICD-10-CM) - 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 No pain today.  Pt accompanied by: self  PERTINENT HISTORY: R CVA s/p hemicaniectomy 07/19/19 with abdominal flap implant.  PMH: hx of seizures and headaches s/p CVA, hx of DVT with IVC filter placement . Pt s/p botox on 01/21/22.  PAIN:  Are you having pain? No  PRECAUTIONS: Fall  WEIGHT BEARING RESTRICTIONS: No  FALLS: Has patient fallen in last 6 months? Yes. Number of falls 2; had to call fire department to get back up, didn't get hurt; one time I was in the wheelchair and I had a sock on and when I leaned over to reach for something I just slid out onto the floor; another time getting out of truck and running board was wet so slid onto the ground  LIVING ENVIRONMENT: Lives with: lives with their spouse Lives in: House/apartment Stairs: Yes: Internal: 12 steps; on right going up  and External: 3 steps;  bilateral but cannot reach both Has following equipment at home: Quad cane large base, Wheelchair (manual), and bed side commode  Tried going up stairs inside home about 8 months ago and fell down the stairs, stairs are carpeted.  PLOF: Independent with gait, Independent with transfers, and Requires assistive device for independence  PATIENT GOALS: "putting on my sock and my shoe myself, working on not dragging my L foot when walking or when on the stairs, work on being able to safely walk down the stairs" "to walk without the brace"    OBJECTIVE  TODAY'S TREATMENT  TherAct  [facility] PT Assessment - 03/06/23 1410       Ambulation/Gait   Gait velocity 32.8 ft over 43.66 sec = 0.75 ft/sec      Standardized Balance Assessment   Standardized Balance Assessment Timed Up and Go Test;Five Times Sit to Stand    Five times sit to stand comments  20.31 sec   pushing up from [facility]     Timed Up and Go Test   TUG Normal TUG    Normal TUG (seconds) 37.5   with LBQC mod I            Gait  STAIRS:  Level of Assistance: SBA  Stair Negotiation Technique: Step to Pattern with Single Rail on Right  Number of Stairs: 4   Height of Stairs: 6  Comments: attempted to descend laterally with use of only R handrail, pt unsafe to perform this due to having to descend with RLE first; encouraged pt to have another railing installed on his stairs at home so he can safely navigate them more independently  GAIT: Gait pattern: decreased arm swing- Left, decreased hip/knee flexion- Left, decreased ankle dorsiflexion- Left, circumduction- Left, lateral lean- Right, and wide BOS Distance walked: various clinic distances Assistive device utilized: Quad cane large base Level of assistance: Modified independence     PATIENT EDUCATION: Education details: continue HEP, working on walking and sit to stands at home Person educated: Patient Education method: Programmer, multimedia, Facilities manager, Actor cues, and Verbal  cues Education comprehension: verbalized understanding and needs further education  HOME EXERCISE PROGRAM: Access Code: [email or web address] URL: [email or web address] Date: 01/30/2023 Prepared by: Peter Congo  - Seated Hamstring Stretch  - 1 x daily - 7 x weekly - 1 sets - 10 reps - 30 sec hold   GOALS: Goals reviewed with patient? Yes  SHORT TERM GOALS=LONG TERM GOALS due to length of POC   LONG TERM GOALS: Target date: 03/09/2023 (updated to match last scheduled appt within cert)  Pt will be independent with final HEP for improved strength, balance, transfers and gait. Baseline:  Goal status: INITIAL  2.  Pt will improve 5 x STS to less than or equal to 25 seconds to demonstrate improved functional strength and transfer efficiency.  Baseline: 29.47 sec (3/4), 20.31 (4/12) Goal status: MET  3.  Pt will improve gait velocity to at least 1.0 ft/sec for improved gait efficiency and performance at mod I level  Baseline: 0.7 ft/sec with LBQC (3/4), 0.75 ft/sec with LBQC (4/12) Goal status: NOT MET  4.  Pt will improve normal TUG to less than or equal to 32 seconds for improved functional mobility and decreased fall risk. Baseline: 37.85 sec with LBQC (3/4), 37.5 sec with LBQC (4/12) Goal status: NOT MET   ASSESSMENT:  CLINICAL IMPRESSION: Emphasis of skilled PT session on reassessing LTG in preparation for d/c from PT services after final aquatic therapy  session. Pt has met 1/3 LTG assessed this date as he improved his 5xSTS score from 29.57 sec initially to 20.31 sec this date. He exhibited minimal improvement in his gait speed and TUG scores but given the chronicity of his CVA this is to be expected. When given results of functional OM this session and plan to d/c from PT after final aquatic therapy session next session pt becomes very withdrawn. Provided emotional support to patient and encouraged him to focus on the things he is currently able to do such as walk, stand,  be fairly independent with his ADLs, etc. Also reminded pt stroke support group meeting times and encouraged him to attend. Continue POC.    OBJECTIVE IMPAIRMENTS: Abnormal gait, decreased balance, decreased coordination, decreased endurance, decreased mobility, difficulty walking, decreased ROM, decreased strength, impaired perceived functional ability, impaired flexibility, impaired tone, and impaired UE functional use.   ACTIVITY LIMITATIONS: carrying, lifting, bending, squatting, stairs, bed mobility, bathing, dressing, reach over head, and hygiene/grooming  PARTICIPATION LIMITATIONS: meal prep, cleaning, laundry, and community activity  PERSONAL FACTORS: Time since onset of injury/illness/exacerbation and 1-2 comorbidities:    R CVA s/p hemicaniectomy 07/19/19 with abdominal flap implant.  PMH: hx of seizures and headaches s/p CVA, hx of DVT with IVC filter placement are also affecting patient's functional outcome.   REHAB POTENTIAL: Fair time since onset, chronic CVA  CLINICAL DECISION MAKING: Stable/uncomplicated  EVALUATION COMPLEXITY: Low  PLAN:  PT FREQUENCY: 2x/week  PT DURATION: 6 weeks  PLANNED INTERVENTIONS: Therapeutic exercises, Therapeutic activity, Neuromuscular re-education, Balance training, Gait training, Patient/Family education, Self Care, Joint mobilization, Stair training, Visual/preceptual remediation/compensation, Orthotic/Fit training, DME instructions, Aquatic Therapy, Dry Needling, Electrical stimulation, Wheelchair mobility training, Cryotherapy, Moist heat, Taping, Manual therapy, and Re-evaluation  PLAN FOR NEXT SESSION: assess final goal (HEP) and d/c from PT services     Peter Congo, PT, DPT, CSRS 03/06/2023, 3:48 PM

## 2023-03-09 ENCOUNTER — Ambulatory Visit: Payer: Medicare Other | Admitting: Physical Therapy

## 2023-03-09 DIAGNOSIS — R2681 Unsteadiness on feet: Secondary | ICD-10-CM

## 2023-03-09 DIAGNOSIS — R2689 Other abnormalities of gait and mobility: Secondary | ICD-10-CM | POA: Diagnosis not present

## 2023-03-09 DIAGNOSIS — I69354 Hemiplegia and hemiparesis following cerebral infarction affecting left non-dominant side: Secondary | ICD-10-CM

## 2023-03-10 ENCOUNTER — Encounter: Payer: Self-pay | Admitting: Physical Therapy

## 2023-03-10 NOTE — Therapy (Signed)
OUTPATIENT PHYSICAL THERAPY NEURO TREATMENT/AQUATIC THERAPY/ DISCHARGE SUMMARY   Patient Name: Matthew Black MRN: [id number] DOB:02-Jul-1976, 47 y.o., male Today's Date: 03/10/2023   PCP: Esperanza Richters, PA-C REFERRING PROVIDER: Horton Chin, MD  END OF SESSION:  PT End of Session - 03/10/23 1738     Visit Number 13    Number of Visits 13   with eval   Date for PT Re-Evaluation 03/23/23    Authorization Type Medicare    PT Start Time 1535    PT Stop Time 1620    PT Time Calculation (min) 45 min    Equipment Utilized During Treatment Other (comment)   aquatic cuff   Activity Tolerance Patient tolerated treatment well    Behavior During Therapy [facility] for tasks assessed/performed                   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   Depression    DVT (deep venous thrombosis)    BLE DVT 07/21/19, 08/02/19; s/p retrievable IVC filter 07/21/19   Hemorrhagic stroke    History of kidney stones    Hypertension    Paralysis    LEFT SIDE   PE (pulmonary thromboembolism)    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   Stroke    RICA, R A1, R MCA occlusion 07/19/19   Past Surgical History:  Procedure Laterality Date   CRANIOPLASTY Right 06/05/2020   Procedure: CRANIOPLASTY;  Surgeon: Lisbeth Renshaw, MD;  Location: [facility] OR;  Service: Neurosurgery;  Laterality: Right;  right   CRANIOTOMY Right 07/19/2019   Procedure: RIGHT HEMI-CRANIECTOMY With implantation of skull flap to abdominal wall;  Surgeon: Lisbeth Renshaw, MD;  Location: [facility] OR;  Service: Neurosurgery;  Laterality: Right;   CYST EXCISION N/A 10/08/2016   Procedure: EXCISION OF POSTERIOR NECK CYST;  Surgeon: Berna Bue, MD;  Location: [facility] ORS;  Service: General;  Laterality: N/A;   CYSTOSCOPY/URETEROSCOPY/HOLMIUM LASER/STENT PLACEMENT Right 03/16/2020   Procedure: CYSTOSCOPY RIGHT RETROGRADE PYELOGRAM URETEROSCOPY/HOLMIUM LASER/STENT PLACEMENT;   Surgeon: Crista Elliot, MD;  Location: [facility] ORS;  Service: Urology;  Laterality: Right;   INCISION AND DRAINAGE ABSCESS N/A 09/22/2014   Procedure: INCISION AND DRAINAGE ABSCESS POSTERIOR NECK;  Surgeon: Valarie Merino, MD;  Location: [facility] ORS;  Service: General;  Laterality: N/A;   INCISION AND DRAINAGE ABSCESS N/A 12/20/2015   Procedure: INCISION AND DRAINAGE POSTERIOR NECK MASS;  Surgeon: Darnell Level, MD;  Location: [facility] ORS;  Service: General;  Laterality: N/A;   INCISION AND DRAINAGE ABSCESS Left 07/10/2004   middle finger   IR IVC FILTER PLMT / S&I /IMG GUID/MOD SED  07/21/2019   IR RADIOLOGIST EVAL & MGMT  12/14/2019   IR RADIOLOGIST EVAL & MGMT  12/21/2020   IR VENOGRAM RENAL UNI RIGHT  07/21/2019   MASS EXCISION N/A 07/21/2017   Procedure: EXCISION OF BENIGN NECK LESION WITH LAYERED CLOSURE;  Surgeon: Glenna Fellows, MD;  Location: [facility];  Service: Plastics;  Laterality: N/A;   MASS EXCISION N/A 11/10/2017   Procedure: EXCISION BENIGN LESION OF THE NECK WITH LAYERED CLOSURE;  Surgeon: Glenna Fellows, MD;  Location: [facility];  Service: Plastics;  Laterality: N/A;   Patient Active Problem List   Diagnosis Date Noted   COVID-19 virus infection 04/21/2021   Left-sided weakness 04/21/2021   S/P craniotomy 06/05/2020   History of cranioplasty 06/05/2020   Peri-rectal abscess 02/14/2020   Abnormal CT  scan, pelvis 02/14/2020   Pancytopenia 12/02/2019   Nephrolithiasis 12/02/2019   Hydronephrosis with renal and ureteral calculus obstruction 12/02/2019   Rectal pain 12/02/2019   Hyperkalemia 12/02/2019   Reactive depression    Wound infection after surgery    Sleep disturbance    Dysphagia, post-stroke    Transaminitis    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   Cerebral abscess    Urinary tract infection without hematuria    Altered mental status    Primary hypercoagulable state    Acute pulmonary embolism without acute cor  pulmonale    Deep vein thrombosis (DVT) of non-extremity vein    Hypokalemia    Acute blood loss anemia    Leukocytosis    Endotracheal tube present    Acute respiratory failure with hypoxemia    Stroke (cerebrum) 07/19/2019   Pressure injury of skin 07/19/2019   Acute CVA (cerebrovascular accident)    Encephalopathy    Dysphagia    Acute encephalopathy    Essential hypertension    Obesity 03/12/2016   Scalp abscess 12/20/2015   Neck abscess 12/20/2015   Pilonidal cyst 02/08/2013    ONSET DATE: 01/21/2023  REFERRING DIAG: R25.2 (ICD-10-CM) - Spasticity I69.352 (ICD-10-CM) - 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 (HCC)  THERAPY DIAG:  Other abnormalities of gait and mobility  Unsteadiness on feet  Hemiplegia and hemiparesis following cerebral infarction affecting left non-dominant side  Rationale for Evaluation and Treatment: Rehabilitation  SUBJECTIVE:                                                                                                                                                                                             SUBJECTIVE STATEMENT: Pt reports he ate today before coming to pool - feels better today than he did last week    Pt accompanied by: self  PERTINENT HISTORY: R CVA s/p hemicaniectomy 07/19/19 with abdominal flap implant.  PMH: hx of seizures and headaches s/p CVA, hx of DVT with IVC filter placement . Pt s/p botox on 01/21/22.  PAIN:  Are you having pain? No  PRECAUTIONS: Fall  WEIGHT BEARING RESTRICTIONS: No  FALLS: Has patient fallen in last 6 months? Yes. Number of falls 2; had to call fire department to get back up, didn't get hurt; one time I was in the wheelchair and I had a sock on and when I leaned over to reach for something I just slid out onto the floor; another time getting out of truck and running board was wet so slid onto the ground  LIVING ENVIRONMENT: Lives with: lives  with  their spouse Lives in: House/apartment Stairs: Yes: Internal: 12 steps; on right going up and External: 3 steps; bilateral but cannot reach both Has following equipment at home: Quad cane large base, Wheelchair (manual), and bed side commode  Tried going up stairs inside home about 8 months ago and fell down the stairs, stairs are carpeted.  PLOF: Independent with gait, Independent with transfers, and Requires assistive device for independence  PATIENT GOALS: "putting on my sock and my shoe myself, working on not dragging my L foot when walking or when on the stairs, work on being able to safely walk down the stairs" "to walk without the brace"  Aquatic therapy: Aquatic therapy at [facility] - pool temp 90 degrees  Patient seen for aquatic therapy today.  Treatment took place in water 3.6-4.8 feet deep depending upon activity.  Pt entered & exited the pool via step negotiation with use of Rt hand rail with min assist with descension due to positioning Lt foot in neutral due to increased tone resulting in supination of Lt foot; CGA to min assist with ascension for exiting pool with use of bil. Hand rails, step by step sequence for both ascension & descension.  Pt performed Lt hamstring/gastroc stretch in standing (runner's stretch) 1 rep 20 sec hold  Lt hamstring stretching in standing - pt placed Lt foot on 3rd step with min assist for positioning - 30 sec hold x 1 rep  Pt performed water walking 18' x 8 reps forwards without UE support on any flotation device:  sideways 18' x 4 reps; backwards 18' x 2 reps with bar bells  Pt performed standing Lt hip exercises for ROM, stretching and strengthening  -  aquatic cuff placed on LLE for stretching with concentric contraction and strengthening for the eccentric contraction  - Lt hip flexion, abduction and extension 10 reps each direction  Sit to stand from bench in pool area (3.8' water depth) with pt using LLE only (held Rt foot  off floor during this transfer) Squats bil. LE's 10 reps:  LLE unilateral squats 10 reps  Marching in place 10 reps without UE support  Pt performed "jogging" as able - 18'x 2 reps across pool - no floatation belt used  Pt requires buoyancy of water for support for reduced fall risk and for unloading/reduced stress on joints as pt able to tolerate increased standing and ambulation in water compared to that on land; viscosity of water is needed for resistance for strengthening and current of water provides perturbations for challenge for balance training         PATIENT EDUCATION: Education details: continue HEP, practicing wider BOS w/standing tasks, PT POC with handout of new schedule provided  Person educated: Patient Education method: Explanation, Demonstration, Tactile cues, and Verbal cues Education comprehension: verbalized understanding and needs further education  HOME EXERCISE PROGRAM: Access Code: [email or web address] URL: [email or web address] Date: 01/30/2023 Prepared by: Peter Congo  - Seated Hamstring Stretch  - 1 x daily - 7 x weekly - 1 sets - 10 reps - 30 sec hold   GOALS: Goals reviewed with patient? Yes  SHORT TERM GOALS=LONG TERM GOALS due to length of POC   LONG TERM GOALS: Target date: 03/09/2023 (updated to match last scheduled appt within cert)  Pt will be independent with final HEP for improved strength, balance, transfers and gait. Baseline:  Goal status: GOAL MET 03-09-23  2.  Pt will improve 5 x STS to less than or equal to 25  seconds to demonstrate improved functional strength and transfer efficiency.  Baseline: 29.47 sec (3/4), 20.31 (4/12) Goal status: MET  3.  Pt will improve gait velocity to at least 1.0 ft/sec for improved gait efficiency and performance at mod I level  Baseline: 0.7 ft/sec with LBQC (3/4), 0.75 ft/sec with LBQC (4/12) Goal status: NOT MET  4.  Pt will improve normal TUG to less than or equal to 32 seconds for  improved functional mobility and decreased fall risk. Baseline: 37.85 sec with LBQC (3/4), 37.5 sec with LBQC (4/12) Goal status: NOT MET   ASSESSMENT:  CLINICAL IMPRESSION: Aquatic PT session focused on LLE strengthening and stretching exercises and water walking in various directions.  LTG's were assessed at previous land session on 03-06-23 with pt meeting LTG #2 and LTG's #3 and 4 not met;  LTG #1 was met in today's session with pt being instructed in aquatic exercises to continue if he so chooses upon discharge from PT.  Pt is discharged from PT at this time due to completion of program and plateau in maximizing functional progress.  Minimal changes in mobility have been achieved due to chronicity of deficits secondary to Rt CVA.      OBJECTIVE IMPAIRMENTS: Abnormal gait, decreased balance, decreased coordination, decreased endurance, decreased mobility, difficulty walking, decreased ROM, decreased strength, impaired perceived functional ability, impaired flexibility, impaired tone, and impaired UE functional use.   ACTIVITY LIMITATIONS: carrying, lifting, bending, squatting, stairs, bed mobility, bathing, dressing, reach over head, and hygiene/grooming  PARTICIPATION LIMITATIONS: meal prep, cleaning, laundry, and community activity  PERSONAL FACTORS: Time since onset of injury/illness/exacerbation and 1-2 comorbidities:    R CVA s/p hemicaniectomy 07/19/19 with abdominal flap implant.  PMH: hx of seizures and headaches s/p CVA, hx of DVT with IVC filter placement are also affecting patient's functional outcome.   REHAB POTENTIAL: Fair time since onset, chronic CVA  CLINICAL DECISION MAKING: Stable/uncomplicated  EVALUATION COMPLEXITY: Low  PLAN:  PT FREQUENCY: 2x/week  PT DURATION: 6 weeks  PLANNED INTERVENTIONS: Therapeutic exercises, Therapeutic activity, Neuromuscular re-education, Balance training, Gait training, Patient/Family education, Self Care, Joint mobilization, Stair  training, Visual/preceptual remediation/compensation, Orthotic/Fit training, DME instructions, Aquatic Therapy, Dry Needling, Electrical stimulation, Wheelchair mobility training, Cryotherapy, Moist heat, Taping, Manual therapy, and Re-evaluation  PLAN FOR NEXT SESSION: D/C 03-09-23   PHYSICAL THERAPY DISCHARGE SUMMARY  Visits from Start of Care: 13  Current functional level related to goals / functional outcomes: See above for progress towards goals   Remaining deficits: Continued dense Lt hemiparesis due to s/p Rt CVA Continued spasticity in LUE and LLE impacting ROM and flexibility Continued dependency with gait and decreased standing balance   Education / Equipment: Pt has been instructed in HEP for stretching and strengthening exercises - both land based and aquatic exercises  Patient agrees to discharge. Patient goals were partially met. Patient is being discharged due to maximized rehab potential.  Pt reports desire to continue with PT but verbalizes understanding of need to discharge at this time.  Unable to justify renewal for recertification/renewal due to plateau in maximizing functional progress at this time.  Pt has been informed that he may return to PT for re-evaluation in the future with new order from MD.      Kary Kos, PT 03/10/2023, 5:41 PM

## 2023-03-16 ENCOUNTER — Encounter
Payer: Medicare Other | Attending: Physical Medicine and Rehabilitation | Admitting: Physical Medicine and Rehabilitation

## 2023-03-16 ENCOUNTER — Encounter: Payer: Self-pay | Admitting: Physical Medicine and Rehabilitation

## 2023-03-16 VITALS — BP 112/76 | HR 79 | Ht 72.0 in | Wt 285.6 lb

## 2023-03-16 DIAGNOSIS — M62838 Other muscle spasm: Secondary | ICD-10-CM | POA: Diagnosis not present

## 2023-03-16 DIAGNOSIS — I639 Cerebral infarction, unspecified: Secondary | ICD-10-CM

## 2023-03-16 DIAGNOSIS — I69352 Hemiplegia and hemiparesis following cerebral infarction affecting left dominant side: Secondary | ICD-10-CM | POA: Diagnosis present

## 2023-03-16 DIAGNOSIS — I10 Essential (primary) hypertension: Secondary | ICD-10-CM | POA: Diagnosis present

## 2023-03-16 MED ORDER — ONABOTULINUMTOXINA 100 UNITS IJ SOLR
100.0000 [IU] | Freq: Once | INTRAMUSCULAR | Status: AC
  Administered 2023-03-16: 100 [IU] via INTRAMUSCULAR

## 2023-03-16 MED ORDER — SODIUM CHLORIDE (PF) 0.9 % IJ SOLN
1.0000 mL | Freq: Once | INTRAMUSCULAR | Status: AC
  Administered 2023-03-16: 1 mL

## 2023-03-16 NOTE — Progress Notes (Signed)
Subjective:    Patient ID: Matthew Black, male    DOB: [birth year], 47 y.o.   MRN: [id number]  HPI  Matthew Black is a 47 year old man who presents for f/u MCA CVA, spasticity, shoulder pain, and aggression.  1) Spasticity: -Botox to pec was very helpful, spasticity has returned after 3 months -he asks if he can get his Botox soon as he is not able to do PT until he gets this -has been taking Baclofen three times per day, it does make him sleepy -he is willing to try additional antispasticity medicine at night to help with his spasticity until he can get in for Botox -Botox is effective for pain relief and he needs it every 3months -His PT also noticed pain has been much better controlled -He keeps stretching out left arm with his right and this has helped to keep the arm loose.   2) Aggression: -Discussed that he has follow-up scheduled with Dr. Kieth Brightly.   3) Shoulder pain -has developed shoulder pain this week that brings him to tears -he says it is hurting all over his shoulder joint -does not feel like the isolated pec pain he has had in the past  4) HTN -he needs refill of his metoprolol -he has been taking his medication regularly -very well controlled today  5) CVA -he asks about Vivistim -he would like to restart therapy Washing dishes and drying them.  Kidney stones resolved following procedure.  IVC filter removed He enjoys cooking, working on stairs. He likes to cook spaghetti and eggs. He is able to walk around the the home. He is able to walk outside as well and wife says that he often walks in front of her.  He plays Scrabble with his kids and enjoys spending time with them.  Has started drinking 1 coffee every morning.   He asks about alcohol use. Has been drinking wine, rum.  Wife states that tightness in elbow and knee flexors has been better and improves the more he walks.  Has sense of humor back.  Headaches have resolved.  He has been driving with his  parents next to him and has been doing a great job. No issues except when he tries to get out of the car as he needs he AD at this time. His wife asks about a platform walker for him.   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 His wife accompanies him and provides most of the history.   No longer has pain at surgical site. Wife asks if he should have repeat CT scan. No longer has headache.   Has been having some right sided abdominal pain. Has kidney stone. Has been having daily BM but they are hard. Also has gas.    He has completed his PT and is able to ambulate around his home.   His wife says that prior to his stroke, he liked to play basketball with their kids (ages 25 and 3), play with their dog, travel, and relax at home. He has told her that he is  happy to be at home.  He has had tightness in his right elbow and knee flexors and this has been painful to him. He has had two falls and his leg stiffness may have contributed to this.   Pain Inventory Average Pain 0 Pain Right Now 0 My pain is  no pain , left sided weakness. Here for Stroke follow up.  In the last 24 hours, has pain interfered with the following? General activity 0 Relation with others 0 Enjoyment of life 0 What TIME of day is your pain at its worst?  no pain Sleep (in general) Good  Pain is worse with:  no pain Pain improves with:  na Relief from Meds:  no pain  7 Bowel movements per week No problem with urination.      Family History  Problem Relation Age of Onset   Healthy Mother    Healthy Father     Clotting disorder Maternal Grandmother    Diabetes Paternal Grandfather    Clotting disorder Maternal Aunt    Clotting disorder Maternal Uncle    Colon cancer Neg Hx    Social History   Socioeconomic History   Marital status: Married    Spouse name: Matthew Black   Number of children: 2   Years of education: Not on file   Highest education level: Not on file  Occupational History   Occupation: unemployed 01/30/21  Tobacco Use   Smoking status: Former    Packs/day: 0    Types: Cigarettes    Quit date: 11/24/2015    Years since quitting: 7.3   Smokeless tobacco: Never   Tobacco comments:       Vaping Use   Vaping Use: Never used  Substance and Sexual Activity   Alcohol use: Yes    Comment: socially   Drug use: Never   Sexual activity: Not Currently  Other Topics Concern   Not on file  Social History Narrative   Lives with wife and kids   Right Handed   Drinks >10 cups caffeine daily   Social Determinants of Health   Financial Resource Strain: Not on file  Food Insecurity: Not on file  Transportation Needs: Not on file  Physical Activity: Not on file  Stress: Not on file  Social Connections: Not on file   Past Surgical History:  Procedure Laterality Date   CRANIOPLASTY Right 06/05/2020   Procedure: CRANIOPLASTY;  Surgeon: Lisbeth Renshaw, MD;  Location[facility] OR;  Service: Neurosurgery;  Laterality: Right;  right   CRANIOTOMY Right 07/19/2019   Procedure: RIGHT HEMI-CRANIECTOMY With implantation of skull flap to abdominal wall;  Surgeon: Lisbeth Renshaw, MD;  Location: [facility] OR;  Service: Neurosurgery;  Laterality: Right;   CYST EXCISION N/A 10/08/2016   Procedure: EXCISION OF POSTERIOR NECK CYST;  Surgeon: Berna Bue, MD;  Location: [facility] ORS;  Service: General;  Laterality: N/A;   CYSTOSCOPY/URETEROSCOPY/HOLMIUM LASER/STENT PLACEMENT Right 03/16/2020   Procedure: CYSTOSCOPY RIGHT RETROGRADE PYELOGRAM URETEROSCOPY/HOLMIUM LASER/STENT PLACEMENT;  Surgeon: Crista Elliot, MD;  Location: [facility] ORS;  Service: Urology;  Laterality: Right;   INCISION AND DRAINAGE ABSCESS N/A 09/22/2014   Procedure: INCISION AND DRAINAGE ABSCESS POSTERIOR NECK;  Surgeon: Valarie Merino, MD;  Location: [facility] ORS;  Service: General;  Laterality: N/A;   INCISION AND DRAINAGE ABSCESS N/A 12/20/2015   Procedure: INCISION AND DRAINAGE POSTERIOR NECK MASS;  Surgeon: Darnell Level, MD;  Location: [facility] ORS;  Service: General;  Laterality: N/A;   INCISION AND DRAINAGE ABSCESS Left 07/10/2004  middle finger   IR IVC FILTER PLMT / S&I /IMG GUID/MOD SED  07/21/2019   IR RADIOLOGIST EVAL & MGMT  12/14/2019   IR RADIOLOGIST EVAL & MGMT  12/21/2020   IR VENOGRAM RENAL UNI RIGHT  07/21/2019   MASS EXCISION N/A 07/21/2017   Procedure: EXCISION OF BENIGN NECK LESION WITH LAYERED CLOSURE;  Surgeon: Glenna Fellows, MD;  Location: [facility];  Service: Plastics;  Laterality: N/A;   MASS EXCISION N/A 11/10/2017   Procedure: EXCISION BENIGN LESION OF THE NECK WITH LAYERED CLOSURE;  Surgeon: Glenna Fellows, MD;  Location: [facility];  Service: Plastics;  Laterality: N/A;   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   Depression    DVT (deep venous thrombosis)    BLE DVT 07/21/19, 08/02/19; s/p retrievable IVC filter 07/21/19   Hemorrhagic stroke    History of kidney stones    Hypertension    Paralysis    LEFT SIDE   PE (pulmonary thromboembolism)    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   Stroke    RICA, R A1, R MCA occlusion 07/19/19   BP 112/76   Pulse 79   Ht 6' (1.829 m)   Wt 285 lb 9.6 oz (129.5 kg)   SpO2 98%   BMI 38.73 kg/m   Opioid Risk Score:   Fall Risk Score:  `1  Depression screen PHQ 2/9     12/15/2022    1:05 PM 06/10/2022   11:24 AM 01/21/2022   11:24 AM 09/24/2021   11:15 AM 07/19/2021    1:58 PM 06/18/2021   11:36 AM 03/21/2021   12:11 PM  Depression screen PHQ 2/9  Decreased Interest  0 0 0 0 0 0 0  Down, Depressed, Hopeless 0 0 0 0 0 0 0  PHQ - 2 Score 0 0 0 0 0 0 0  Altered sleeping     0    Tired, decreased energy     0    Change in appetite     0    Feeling bad or failure about yourself      0    Trouble concentrating     0    Moving slowly or fidgety/restless     0    Suicidal thoughts     0    PHQ-9 Score     0    Difficult doing work/chores     Not difficult at all       Review of Systems +left sided hemiplegia +left pectoralis tightness    Objective:   Physical Exam Gen: no distress, normal appearing HEENT: oral mucosa pink and moist, NCAT Cardio: Reg rate Chest: normal effort, normal rate of breathing Abd: soft, non-distended Ext: no edema Psych: pleasant, normal affect Skin: intact Neuro: Alert and oriented x3, slowed speech Musculoskeletal: +left pectoralis tightness, MAS 1 throughout left arm     Assessment & Plan:  Matthew Black is a a 47 year old man who presents for f/u following his right middle cerebral artery stroke.   1) Headache -Continues to be resolved. Advised that he does not required repeat CT Head unless he develops symptoms and signs of infection or bleed--discussed which to look out for.   2) Abdominal pain: -Resolved following surgical removal of kidney stones  3) Weakness/spasticity: -Left sided tone much improved after Botox. Will target left pec major again. Will repeat every 3 months. Arm is currently  without signficant tone -Asked staff to prioritize him as #1 on waitlist for Botox -prescribed tizanidine to help his spasticity until he is able to get in -Encouraged him to keep stretching left arm.  -Discussed transcranial magnetic stimulation as a an option to improve muscle strength. Also discussed Sprint PNS as an option. Provided links and phones numbers for both options for wife to investigate further.  -Made goal to walk outside 15 minutes per day, start resistance training with low weights.  -Provided script for  platform walker -Provided script for occupational therapy.  Botox Injection for spasticity using needle EMG guidance  Dilution: 1:1 Units/ml Indication: Severe spasticity which interferes with ADL,mobility and/or  hygiene and is unresponsive to medication management and other conservative care Informed consent was obtained after describing risks and benefits of the procedure with the patient. This includes bleeding, bruising, infection, excessive weakness, or medication side effects. A REMS form is on file and signed.  Pectoralis: 100  All injections were done after obtaining appropriate [place] visualization and after negative drawback for blood. The patient tolerated the procedure well. Post procedure instructions were given. A followup appointment was made.    4) Aggression: -provided referral to see Dr. Kieth Brightly  5) shoulder pain -prescribed steroid dose pack -if does not help, can consider steroid injection into the joint.   6) MCA stroke: -provided with handicap placard -discussed Vivistim, discussed that I don't believe he will qualify for current trials given his current level of weakness  7) HTN -continue metoprolol -discussed excellent control today -Advised checking BP daily at home and logging results to bring into follow-up appointment with PCP and myself. -Reviewed BP meds today.  -Advised regarding healthy foods that can help lower blood pressure and provided with a list: 1) citrus foods- high in vitamins and minerals 2) salmon and other fatty fish - reduces inflammation and oxylipins 3) swiss chard (leafy green)- high level of nitrates 4) pumpkin seeds- one of the best natural sources of magnesium 5) Beans and lentils- high in fiber, magnesium, and potassium 6) Berries- high in flavonoids 7) Amaranth (whole grain, can be cooked similarly to rice and oats)- high in magnesium and fiber 8) Pistachios- even more effective at reducing BP than other nuts 9) Carrots- high  in phenolic compounds that relax blood vessels and reduce inflammation 10) Celery- contain phthalides that relax tissues of arterial walls 11) Tomatoes- can also improve cholesterol and reduce risk of heart disease 12) Broccoli- good source of magnesium, calcium, and potassium 13) Greek yogurt: high in potassium and calcium 14) Herbs and spices: Celery seed, cilantro, saffron, lemongrass, black cumin, ginseng, cinnamon, cardamom, sweet basil, and ginger 15) Chia and flax seeds- also help to lower cholesterol and blood sugar 16) Beets- high levels of nitrates that relax blood vessels  17) spinach and bananas- high in potassium  -Provided lise of supplements that can help with hypertension:  1) magnesium: one high quality brand is Bioptemizers since it contains all 7 types of magnesium, otherwise over the counter magnesium gluconate 400mg  is a good option 2) B vitamins 3) vitamin D 4) potassium 5) CoQ10 6) L-arginine 7) Vitamin C 8) Beetroot -Educated that goal BP is 120/80. -Made goal to incorporate some of the above foods into diet.    8) Impaired mobility and ADLs -completed CAP forms due to a need for a lift at home so he can ascend the stairs, advised that if he does not hear about these in a week he should let me  know -continue aquatherapy, provided new referral  Cranial incision healing well.   Quality of life: Discussed spending time with his kids, playing games to keep his mind sharp.    All questions were encouraged and answered. Follow up with me in 3 months for repeat Botox

## 2023-03-19 ENCOUNTER — Telehealth: Payer: Self-pay | Admitting: Physical Therapy

## 2023-03-19 NOTE — Telephone Encounter (Signed)
Hey Dr. Carlis Abbott,  I saw that [name] had a new referral for PT. He did just discharge from PT services a few weeks ago as he had made little to no progress in therapy over the past few months with poor carryover of techniques worked on in therapy sessions and with ongoing poor ability to weight shift to his left side. We have told him to wait 6 months before returning to PT.  Thank you! Ladona Ridgel

## 2023-04-09 ENCOUNTER — Encounter
Payer: Medicare Other | Attending: Physical Medicine and Rehabilitation | Admitting: Physical Medicine and Rehabilitation

## 2023-04-09 DIAGNOSIS — I69352 Hemiplegia and hemiparesis following cerebral infarction affecting left dominant side: Secondary | ICD-10-CM | POA: Diagnosis not present

## 2023-04-09 DIAGNOSIS — M62838 Other muscle spasm: Secondary | ICD-10-CM | POA: Diagnosis not present

## 2023-04-09 DIAGNOSIS — I639 Cerebral infarction, unspecified: Secondary | ICD-10-CM | POA: Insufficient documentation

## 2023-04-09 NOTE — Progress Notes (Addendum)
Subjective:    Patient ID: Matthew Black, male    DOB: Apr 15, 1976, 47 y.o.   MRN: [id number]  HPI  Matthew Black is a 47 year old man who presents for f/u MCA CVA, spasticity, shoulder pain, and aggression.  1) Spasticity: -asks for hospital bed- has been in pain recently since his bed broke -Botox to pec was very helpful, spasticity has returned after 3 months -he asks if he can get his Botox soon as he is not able to do PT until he gets this -has been taking Baclofen three times per day, it does make him sleepy -he is willing to try additional antispasticity medicine at night to help with his spasticity until he can get in for Botox -Botox is effective for pain relief and he needs it every 3months -His PT also noticed pain has been much better controlled -He keeps stretching out left arm with his right and this has helped to keep the arm loose.   2) Aggression: -Discussed that he has follow-up scheduled with Dr. Kieth Brightly.   3) Shoulder pain -has developed shoulder pain this week that brings him to tears -he says it is hurting all over his shoulder joint -does not feel like the isolated pec pain he has had in the past  4) HTN -he needs refill of his metoprolol -he has been taking his medication regularly -very well controlled today  5) CVA -he asks about Vivistim -he would like to restart therapy Washing dishes and drying them.  Kidney stones resolved following procedure.  IVC filter removed He enjoys cooking, working on stairs. He likes to cook spaghetti and eggs. He is able to walk around the the home. He is able to walk outside as well and wife says that he often walks in front of her.  He plays Scrabble with his kids and enjoys spending time with them.  Has started drinking 1 coffee every morning.   He asks about alcohol use. Has been drinking wine, rum.  Wife states that tightness in elbow and knee flexors has been better and improves the more he walks.  Has sense of  humor back.  Headaches have resolved.  He has been driving with his parents next to him and has been doing a great job. No issues except when he tries to get out of the car as he needs he AD at this time. His wife asks about a platform walker for him.   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 His wife accompanies him and provides most of the history.   No longer has pain at surgical site. Wife asks if he should have repeat CT scan. No longer has headache.   Has been having some right sided abdominal pain. Has kidney stone. Has been having daily BM but they are hard. Also has gas.    He has completed his PT and is able to ambulate around his home.   His wife says that prior to his stroke, he liked to play basketball with their kids (ages 63 and 1), play with their  dog, travel, and relax at home. He has told her that he is happy to be at home.  He has had tightness in his right elbow and knee flexors and this has been painful to him. He has had two falls and his leg stiffness may have contributed to this.   Pain Inventory Average Pain 0 Pain Right Now 0 My pain is  no pain , left sided weakness. Here for Stroke follow up.  In the last 24 hours, has pain interfered with the following? General activity 0 Relation with others 0 Enjoyment of life 0 What TIME of day is your pain at its worst?  no pain Sleep (in general) Good  Pain is worse with:  no pain Pain improves with:  na Relief from Meds:  no pain  7 Bowel movements per week No problem with urination.      Family History   Problem Relation Age of Onset   Healthy Mother    Healthy Father    Clotting disorder Maternal Grandmother    Diabetes Paternal Grandfather    Clotting disorder Maternal Aunt    Clotting disorder Maternal Uncle    Colon cancer Neg Hx    Social History   Socioeconomic History   Marital status: Married    Spouse name: Vangie Bicker   Number of children: 2   Years of education: Not on file   Highest education level: Not on file  Occupational History   Occupation: unemployed 01/30/21  Tobacco Use   Smoking status: Former    Packs/day: 0    Types: Cigarettes    Quit date: 11/24/2015    Years since quitting: 7.3   Smokeless tobacco: Never   Tobacco comments:       Vaping Use   Vaping Use: Never used  Substance and Sexual Activity   Alcohol use: Yes    Comment: socially   Drug use: Never   Sexual activity: Not Currently  Other Topics Concern   Not on file  Social History Narrative   Lives with wife and kids   Right Handed   Drinks >10 cups caffeine daily   Social Determinants of Health   Financial Resource Strain: Not on file  Food Insecurity: Not on file  Transportation Needs: Not on file  Physical Activity: Not on file  Stress: Not on file  Social Connections: Not on file   Past Surgical History:  Procedure Laterality Date   CRANIOPLASTY Right 06/05/2020   Procedure: CRANIOPLASTY;  Surgeon: Lisbeth Renshaw, MD;  Location: [facility] OR;  Service: Neurosurgery;  Laterality: Right;  right   CRANIOTOMY Right 07/19/2019   Procedure: RIGHT HEMI-CRANIECTOMY With implantation of skull flap to abdominal wall;  Surgeon: Lisbeth Renshaw, MD;  Location: [facility] OR;  Service: Neurosurgery;  Laterality: Right;   CYST EXCISION N/A 10/08/2016   Procedure: EXCISION OF POSTERIOR NECK CYST;  Surgeon: Berna Bue, MD;  Location: [facility];  Service: General;  Laterality: N/A;   CYSTOSCOPY/URETEROSCOPY/HOLMIUM LASER/STENT PLACEMENT Right 03/16/2020   Procedure: CYSTOSCOPY RIGHT RETROGRADE  PYELOGRAM URETEROSCOPY/HOLMIUM LASER/STENT PLACEMENT;  Surgeon: Crista Elliot, MD;  Location: [facility];  Service: Urology;  Laterality: Right;   INCISION AND DRAINAGE ABSCESS N/A 09/22/2014   Procedure: INCISION AND DRAINAGE ABSCESS POSTERIOR NECK;  Surgeon: Valarie Merino, MD;  Location: [facility];  Service: General;  Laterality: N/A;   INCISION AND DRAINAGE ABSCESS N/A 12/20/2015   Procedure: INCISION AND DRAINAGE POSTERIOR NECK MASS;  Surgeon: Darnell Level, MD;  Location: [facility];  Service: General;  Laterality: N/A;   INCISION AND DRAINAGE ABSCESS Left 07/10/2004   middle finger   IR IVC FILTER PLMT / S&I /IMG GUID/MOD SED  07/21/2019   IR RADIOLOGIST EVAL & MGMT  12/14/2019   IR RADIOLOGIST EVAL & MGMT  12/21/2020   IR VENOGRAM RENAL UNI RIGHT  07/21/2019   MASS EXCISION N/A 07/21/2017   Procedure: EXCISION OF BENIGN NECK LESION WITH LAYERED CLOSURE;  Surgeon: Glenna Fellows, MD;  Location: [facility];  Service: Plastics;  Laterality: N/A;   MASS EXCISION N/A 11/10/2017   Procedure: EXCISION BENIGN LESION OF THE NECK WITH LAYERED CLOSURE;  Surgeon: Glenna Fellows, MD;  Location: [facility];  Service: Plastics;  Laterality: N/A;   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  Opioid Risk Score:   Fall Risk Score:  `1  Depression screen PHQ 2/9     12/15/2022    1:05 PM 06/10/2022   11:24 AM 01/21/2022   11:24 AM 09/24/2021   11:15 AM 07/19/2021    1:58 PM 06/18/2021   11:36 AM 03/21/2021   12:11 PM   Depression screen PHQ 2/9  Decreased Interest 0 0 0 0 0 0 0  Down, Depressed, Hopeless 0 0 0 0 0 0 0  PHQ - 2 Score 0 0 0 0 0 0 0  Altered sleeping     0    Tired, decreased energy     0    Change in appetite     0    Feeling bad or failure about yourself      0    Trouble concentrating     0    Moving slowly or fidgety/restless     0    Suicidal thoughts     0    PHQ-9 Score     0    Difficult doing work/chores     Not difficult at all       Review of Systems +left sided hemiplegia +left pectoralis tightness    Objective:   Physical Exam Not performed     Assessment & Plan:  Matthew Black is a a 47 year old man who presents for f/u following his right middle cerebral artery stroke.   1) Headache -Continues to be resolved. Advised that he does not required repeat CT Head unless he develops symptoms and signs of infection or bleed--discussed which to look out for.   2) Abdominal pain: -Resolved following surgical removal of kidney stones  3) Weakness/spasticity: -hospital bed ordered as currently is in need of repair and he currently has low back pain and lower leg pain when using the regular bed -Left sided tone much improved after Botox. Will target left pec major again. Will repeat every 3 months. Arm is currently without signficant tone -Asked staff to prioritize him as #1 on waitlist for Botox -prescribed tizanidine to help his spasticity until he is able to  get in -Encouraged him to keep stretching left arm.  -Discussed transcranial magnetic stimulation as a an option to improve muscle strength. Also discussed [employer] PNS as an option. Provided links and phones numbers for both options for wife to investigate further.  -Made goal to walk outside 15 minutes per day, start resistance training with low weights.  -Provided script for platform walker -Provided script for occupational therapy.  Botox Injection for spasticity using needle EMG guidance  Dilution: 1:1  Units/ml Indication: Severe spasticity which interferes with ADL,mobility and/or  hygiene and is unresponsive to medication management and other conservative care Informed consent was obtained after describing risks and benefits of the procedure with the patient. This includes bleeding, bruising, infection, excessive weakness, or medication side effects. A REMS form is on file and signed.  Pectoralis: 100  All injections were done after obtaining appropriate [place] visualization and after negative drawback for blood. The patient tolerated the procedure well. Post procedure instructions were given. A followup appointment was made.    4) Aggression: -provided referral to see Dr. Kieth Brightly  5) shoulder pain -prescribed steroid dose pack -if does not help, can consider steroid injection into the joint.   6) MCA stroke: -provided with handicap placard -discussed Vivistim, discussed that I don't believe he will qualify for current trials given his current level of weakness  7) HTN -continue metoprolol -discussed excellent control today -Advised checking BP daily at home and logging results to bring into follow-up appointment with PCP and myself. -Reviewed BP meds today.  -Advised regarding healthy foods that can help lower blood pressure and provided with a list: 1) citrus foods- high in vitamins and minerals 2) salmon and other fatty fish - reduces inflammation and oxylipins 3) swiss chard (leafy green)- high level of nitrates 4) pumpkin seeds- one of the best natural sources of magnesium 5) Beans and lentils- high in fiber, magnesium, and potassium 6) Berries- high in flavonoids 7) Amaranth (whole grain, can be cooked similarly to rice and oats)- high in magnesium and fiber 8) Pistachios- even more effective at reducing BP than other nuts 9) Carrots- high in phenolic compounds that relax blood vessels and reduce inflammation 10) Celery- contain phthalides that relax tissues of arterial  walls 11) Tomatoes- can also improve cholesterol and reduce risk of heart disease 12) Broccoli- good source of magnesium, calcium, and potassium 13) Greek yogurt: high in potassium and calcium 14) Herbs and spices: Celery seed, cilantro, saffron, lemongrass, black cumin, ginseng, cinnamon, cardamom, sweet basil, and ginger 15) Chia and flax seeds- also help to lower cholesterol and blood sugar 16) Beets- high levels of nitrates that relax blood vessels  17) spinach and bananas- high in potassium  -Provided lise of supplements that can help with hypertension:  1) magnesium: one high quality brand is Bioptemizers since it contains all 7 types of magnesium, otherwise over the counter magnesium gluconate 400mg  is a good option 2) B vitamins 3) vitamin D 4) potassium 5) CoQ10 6) L-arginine 7) Vitamin C 8) Beetroot -Educated that goal BP is 120/80. -Made goal to incorporate some of the above foods into diet.    8) Impaired mobility and ADLs -completed CAP forms due to a need for a lift at home so he can ascend the stairs, advised that if he does not hear about these in a week he should let me know -continue aquatherapy, provided new referral  Has Limited Mobility with altered sensory perception  Cranial incision healing well.   Quality of life: Discussed  spending time with his kids, playing games to keep his mind sharp.    All questions were encouraged and answered. Follow up with me in 3 months for repeat Botox  5 minutes spend in discussion of his need for repairs to his hospital bed and that I would send in an order to [facility]

## 2023-04-16 ENCOUNTER — Other Ambulatory Visit: Payer: Self-pay | Admitting: Medical

## 2023-04-21 ENCOUNTER — Ambulatory Visit (INDEPENDENT_AMBULATORY_CARE_PROVIDER_SITE_OTHER): Payer: Medicare Other | Admitting: Medical

## 2023-04-21 VITALS — BP 124/80 | HR 68 | Resp 18 | Ht 72.0 in | Wt 285.0 lb

## 2023-04-21 DIAGNOSIS — I10 Essential (primary) hypertension: Secondary | ICD-10-CM | POA: Diagnosis not present

## 2023-04-21 DIAGNOSIS — M545 Low back pain, unspecified: Secondary | ICD-10-CM

## 2023-04-21 DIAGNOSIS — E7849 Other hyperlipidemia: Secondary | ICD-10-CM | POA: Diagnosis not present

## 2023-04-21 DIAGNOSIS — Z1211 Encounter for screening for malignant neoplasm of colon: Secondary | ICD-10-CM

## 2023-04-21 DIAGNOSIS — R350 Frequency of micturition: Secondary | ICD-10-CM | POA: Diagnosis not present

## 2023-04-21 DIAGNOSIS — R739 Hyperglycemia, unspecified: Secondary | ICD-10-CM | POA: Diagnosis not present

## 2023-04-21 NOTE — Addendum Note (Signed)
Addended by: Gwenevere Abbot on: 04/21/2023 03:36 PM   Modules accepted: Orders

## 2023-04-21 NOTE — Progress Notes (Addendum)
Subjective:    Patient ID: Matthew Black, male    DOB: 12/23/1975, 47 y.o.   MRN: [id number]  HPI     History of stroke/CVA.  Postcraniotomy.   Left upper extremity and lower extremity weakness.  Difficulty with ADLs.  On discussion particularly cooking and self-care/bathing himself.  Patient had requested assistance for personal care services.  I had filled out paperwork and it did go thru. He states person who comes over is nice and helps a lot.   Hypertension-blood pressure reasonably controlled today.  Continue metoprolol 50 mg twice daily.    Hyperlipidemia-on atorvastatin 10 mg daily.   Left shoulder and arm pain since stroke.  Patient describes getting Botox injections injection.  He describes 1 upcoming in the near future.  Left lower extremity spasms as well.  He is on baclofen.  Some increased frequency of urination at times.  Mild elevated sugar elevation in past. A1-C last one was 6.2.   Mild lipid elevation. On Atorvastatin.    Review of Systems  Constitutional:  Negative for appetite change, diaphoresis and fatigue.  HENT:  Negative for congestion, ear discharge, ear pain, hearing loss and mouth sores.   Respiratory:  Negative for cough, choking, shortness of breath and wheezing.   Cardiovascular:  Negative for chest pain and palpitations.  Gastrointestinal:  Negative for abdominal pain, blood in stool, constipation and diarrhea.  Genitourinary:  Positive for frequency. Negative for dysuria, hematuria and penile discharge.  Musculoskeletal:  Positive for back pain. Negative for joint swelling, myalgias and neck stiffness.       Pt has back pain for one month. He has hospital bed. He states middle of mattress is sunk in the middle he has this matress for three years. He states using this mattress cause low back pain. He states tried to flip matress but still uncomofortable. He states poor sleep quality due to uncomfortable matress. At night can't sleep.   Skin:   Negative for rash.  Neurological:  Negative for dizziness, speech difficulty, weakness, numbness and headaches.  Hematological:  Negative for adenopathy. Does not bruise/bleed easily.  Psychiatric/Behavioral:  Negative for behavioral problems, decreased concentration and dysphoric mood.     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   Stroke (HCC)    RICA, R A1, R MCA occlusion 07/19/19     Social History   Socioeconomic History   Marital status: Married    Spouse name: Vangie Bicker   Number of children: 2   Years of education: Not on file   Highest education level: Not on file  Occupational History   Occupation: unemployed 01/30/21  Tobacco Use   Smoking status: Former    Packs/day: 0    Types: Cigarettes    Quit date: 11/24/2015    Years since quitting: 7.4   Smokeless tobacco: Never   Tobacco comments:       Vaping Use   Vaping Use: Never used  Substance and Sexual Activity   Alcohol use: Yes    Comment: socially   Drug use: Never   Sexual activity:  Not Currently  Other Topics Concern   Not on file  Social History Narrative   Lives with wife and kids   Right Handed   Drinks >10 cups caffeine daily   Social Determinants of Health   Financial Resource Strain: Not on file  Food Insecurity: Not on file  Transportation Needs: Not on file  Physical Activity: Not on file  Stress: Not on file  Social Connections: Not on file  Intimate Partner Violence: Not on file    Past Surgical History:  Procedure Laterality Date   CRANIOPLASTY Right 06/05/2020   Procedure: CRANIOPLASTY;  Surgeon: Lisbeth Renshaw, MD;   Location[facility] OR;  Service: Neurosurgery;  Laterality: Right;  right   CRANIOTOMY Right 07/19/2019   Procedure: RIGHT HEMI-CRANIECTOMY With implantation of skull flap to abdominal wall;  Surgeon: Lisbeth Renshaw, MD;  Location: [facility] OR;  Service: Neurosurgery;  Laterality: Right;   CYST EXCISION N/A 10/08/2016   Procedure: EXCISION OF POSTERIOR NECK CYST;  Surgeon: Berna Bue, MD;  Location: [facility];  Service: General;  Laterality: N/A;   CYSTOSCOPY/URETEROSCOPY/HOLMIUM LASER/STENT PLACEMENT Right 03/16/2020   Procedure: CYSTOSCOPY RIGHT RETROGRADE PYELOGRAM URETEROSCOPY/HOLMIUM LASER/STENT PLACEMENT;  Surgeon: Crista Elliot, MD;  Location: [facility];  Service: Urology;  Laterality: Right;   INCISION AND DRAINAGE ABSCESS N/A 09/22/2014   Procedure: INCISION AND DRAINAGE ABSCESS POSTERIOR NECK;  Surgeon: Valarie Merino, MD;  Location: [facility];  Service: General;  Laterality: N/A;   INCISION AND DRAINAGE ABSCESS N/A 12/20/2015   Procedure: INCISION AND DRAINAGE POSTERIOR NECK MASS;  Surgeon: Darnell Level, MD;  Location: [facility];  Service: General;  Laterality: N/A;   INCISION AND DRAINAGE ABSCESS Left 07/10/2004   middle finger   IR IVC FILTER PLMT / S&I /IMG GUID/MOD SED  07/21/2019   IR RADIOLOGIST EVAL & MGMT  12/14/2019   IR RADIOLOGIST EVAL & MGMT  12/21/2020   IR VENOGRAM RENAL UNI RIGHT  07/21/2019   MASS EXCISION N/A 07/21/2017   Procedure: EXCISION OF BENIGN NECK LESION WITH LAYERED CLOSURE;  Surgeon: Glenna Fellows, MD;  Location: [facility];  Service: Plastics;  Laterality: N/A;   MASS EXCISION N/A 11/10/2017   Procedure: EXCISION BENIGN LESION OF THE NECK WITH LAYERED CLOSURE;  Surgeon: Glenna Fellows, MD;  Location: [facility];  Service: Plastics;  Laterality: N/A;    Family History  Problem Relation Age of Onset   Healthy Mother    Healthy Father    Clotting disorder Maternal Grandmother    Diabetes Paternal Grandfather    Clotting disorder  Maternal Aunt    Clotting disorder Maternal Uncle    Colon cancer Neg Hx     Allergies  Allergen Reactions   Hydrocodone Nausea Only    Current Outpatient Medications on File Prior to Visit  Medication Sig Dispense Refill   ASPIRIN LOW DOSE 81 MG tablet TAKE 1 TABLET (81 MG TOTAL) BY MOUTH DAILY. SWALLOW WHOLE. 90 tablet 1   atorvastatin (LIPITOR) 10 MG tablet TAKE 1 TABLET BY MOUTH EVERY DAY 30 tablet 5   baclofen (LIORESAL) 10 MG tablet Take 1 tablet (10 mg total) by mouth 3 (three) times daily. 270 tablet 1   metoprolol tartrate (LOPRESSOR) 50 MG tablet Take 1 tablet (50 mg total) by mouth 2 (two) times daily. 180 tablet 3   tiZANidine (ZANAFLEX) 4 MG tablet Take 1 tablet (4 mg total) by mouth at bedtime as needed for muscle spasms. 90 tablet 3   No current  facility-administered medications on file prior to visit.    BP 124/80   Pulse 68   Resp 18   Ht 6' (1.829 m)   Wt 285 lb (129.3 kg)   SpO2 98%   BMI 38.65 kg/m        Objective:   Physical Exam  General Mental Status- Alert. General Appearance- Not in acute distress.    Skin General: Color- Normal Color. Moisture- Normal Moisture.   Neck Carotid Arteries- Normal color. Moisture- Normal Moisture. No carotid bruits. No JVD.   Chest and Lung Exam Auscultation: Breath Sounds:-Normal.   Cardiovascular Auscultation:Rythm- Regular. Murmurs & Other Heart Sounds:Auscultation of the heart reveals- No Murmurs.   Abdomen Inspection:-Inspeection Normal. Palpation/Percussion:Note:No mass. Palpation and Percussion of the abdomen reveal- Non Tender, Non Distended + BS, no rebound or guarding.   Neurologic Cranial Nerve exam:- CN III-XII intact(No nystagmus), symmetric smile. Strength:- rt upper and lower ext normal. Left upper and lower ext- 1/5 weakness. Minimal range of motion.    Left lower ext-no swollen. Left lower ext is braced.      Assessment & Plan:   Patient Instructions  1. Screening for colon  cancer Please call office directly.  - Ambulatory referral to Gastroenterology  2. Frequent urination(mild frequent urination) - PSA  3. Essential hypertension blood pressure reasonably controlled today.  Continue metoprolol 50 mg twice daily.    4. Other hyperlipidemia Continue atorvastatin.  Get cmp and lipid panel today  5. Elevated blood sugar A1c today.   6. Hx of stroke. For lower ext spasms continue baclofen.  Follow up 6 months or sooner if needed   [employer], PA-C

## 2023-04-21 NOTE — Patient Instructions (Addendum)
1. Screening for colon cancer Please call office directly.  - Ambulatory referral to Gastroenterology  2. Frequent urination(mild frequent urination) - PSA  3. Essential hypertension blood pressure reasonably controlled today.  Continue metoprolol 50 mg twice daily.    4. Other hyperlipidemia Continue atorvastatin.  Get cmp and lipid panel today  5. Elevated blood sugar A1c today.   6. Hx of stroke. For lower ext spasms continue baclofen.  For back pain over past over past 1-2 month decided to get xray. Since back pain from matress preventing from sleepin and daily pain wrote note to get new matress for hospital.  Follow up 6 months or sooner if needed

## 2023-04-22 LAB — COMPREHENSIVE METABOLIC PANEL
ALT: 28 U/L (ref 0–53)
AST: 24 U/L (ref 0–37)
Albumin: 4.2 g/dL (ref 3.5–5.2)
Alkaline Phosphatase: 151 U/L — ABNORMAL HIGH (ref 39–117)
BUN: 11 mg/dL (ref 6–23)
CO2: 28 mEq/L (ref 19–32)
Calcium: 9.3 mg/dL (ref 8.4–10.5)
Chloride: 107 mEq/L (ref 96–112)
Creatinine, Ser: 1.12 mg/dL (ref 0.40–1.50)
GFR: 79.4 mL/min (ref >60.00)
Glucose, Bld: 81 mg/dL (ref 70–99)
Potassium: 4 mEq/L (ref 3.5–5.1)
Sodium: 143 mEq/L (ref 135–145)
Total Bilirubin: 0.6 mg/dL (ref 0.2–1.2)
Total Protein: 8.2 g/dL (ref 6.0–8.3)

## 2023-04-22 LAB — LIPID PANEL
Cholesterol: 130 mg/dL (ref 0–200)
HDL: 30.6 mg/dL — ABNORMAL LOW (ref >39.00)
LDL Cholesterol: 78 mg/dL (ref 0–99)
NonHDL: 98.95
Total CHOL/HDL Ratio: 4
Triglycerides: 103 mg/dL (ref 0.0–149.0)
VLDL: 20.6 mg/dL (ref 0.0–40.0)

## 2023-04-22 LAB — HEMOGLOBIN A1C: Hgb A1c MFr Bld: 6.2 % (ref 4.6–6.5)

## 2023-04-22 LAB — PSA: PSA: 0.26 ng/mL (ref 0.10–4.00)

## 2023-04-24 ENCOUNTER — Other Ambulatory Visit: Payer: Self-pay | Admitting: Physical Medicine and Rehabilitation

## 2023-04-24 DIAGNOSIS — I69352 Hemiplegia and hemiparesis following cerebral infarction affecting left dominant side: Secondary | ICD-10-CM

## 2023-04-30 ENCOUNTER — Telehealth: Payer: Self-pay | Admitting: Physical Medicine and Rehabilitation

## 2023-04-30 NOTE — Telephone Encounter (Signed)
Patient called stating he needs a new brace for his left hand.  He said that therapy gave him his last one about 2 or 3 years ago.  The wone he has is worn out.  Please call patient.

## 2023-05-01 ENCOUNTER — Other Ambulatory Visit: Payer: Self-pay | Admitting: Physical Medicine and Rehabilitation

## 2023-05-01 DIAGNOSIS — I639 Cerebral infarction, unspecified: Secondary | ICD-10-CM

## 2023-05-01 NOTE — Telephone Encounter (Signed)
Pt made aware

## 2023-05-04 ENCOUNTER — Ambulatory Visit: Payer: Medicare Other | Attending: Medical | Admitting: Occupational Therapy

## 2023-05-04 ENCOUNTER — Encounter: Payer: Self-pay | Admitting: Occupational Therapy

## 2023-05-04 ENCOUNTER — Other Ambulatory Visit: Payer: Self-pay

## 2023-05-04 DIAGNOSIS — I69354 Hemiplegia and hemiparesis following cerebral infarction affecting left non-dominant side: Secondary | ICD-10-CM | POA: Diagnosis present

## 2023-05-04 DIAGNOSIS — R2689 Other abnormalities of gait and mobility: Secondary | ICD-10-CM | POA: Diagnosis present

## 2023-05-04 DIAGNOSIS — M6281 Muscle weakness (generalized): Secondary | ICD-10-CM | POA: Diagnosis present

## 2023-05-04 DIAGNOSIS — R278 Other lack of coordination: Secondary | ICD-10-CM | POA: Insufficient documentation

## 2023-05-04 DIAGNOSIS — I639 Cerebral infarction, unspecified: Secondary | ICD-10-CM | POA: Insufficient documentation

## 2023-05-04 DIAGNOSIS — M24542 Contracture, left hand: Secondary | ICD-10-CM

## 2023-05-04 DIAGNOSIS — Z741 Need for assistance with personal care: Secondary | ICD-10-CM | POA: Diagnosis present

## 2023-05-04 DIAGNOSIS — R2681 Unsteadiness on feet: Secondary | ICD-10-CM | POA: Insufficient documentation

## 2023-05-04 DIAGNOSIS — M25512 Pain in left shoulder: Secondary | ICD-10-CM | POA: Diagnosis present

## 2023-05-04 DIAGNOSIS — G8929 Other chronic pain: Secondary | ICD-10-CM | POA: Diagnosis present

## 2023-05-04 NOTE — Therapy (Unsigned)
OUTPATIENT OCCUPATIONAL THERAPY NEURO EVALUATION  Patient Name: Matthew Black MRN: [id number] DOB:[birth year], 47 y.o., male Today's Date: 05/04/2023  PCP: Esperanza Richters, PA-C REFERRING PROVIDER: Horton Chin, MD  END OF SESSION:  OT End of Session - 05/04/23 1438     Visit Number 1    Number of Visits 7    Authorization Type Medicare A & B  20% coinsurance    OT Start Time 1445    OT Stop Time 1600    OT Time Calculation (min) 75 min    Equipment Utilized During Treatment Splinting straps    Activity Tolerance Patient tolerated treatment well    Behavior During Therapy WFL for tasks assessed/performed             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   Stroke (HCC)    RICA, R A1, R MCA occlusion 07/19/19   Past Surgical History:  Procedure Laterality Date   CRANIOPLASTY Right 06/05/2020   Procedure: CRANIOPLASTY;  Surgeon: Lisbeth Renshaw, MD;  Location: [facility] OR;  Service: Neurosurgery;  Laterality: Right;  right   CRANIOTOMY Right 07/19/2019   Procedure: RIGHT HEMI-CRANIECTOMY With implantation of skull flap to abdominal wall;  Surgeon: Lisbeth Renshaw, MD;  Location: [facility] OR;  Service: Neurosurgery;  Laterality: Right;   CYST EXCISION N/A 10/08/2016   Procedure: EXCISION OF POSTERIOR NECK CYST;  Surgeon: Berna Bue, MD;  Location: [facility] ORS;  Service: General;  Laterality: N/A;   CYSTOSCOPY/URETEROSCOPY/HOLMIUM LASER/STENT PLACEMENT Right 03/16/2020   Procedure: CYSTOSCOPY RIGHT RETROGRADE PYELOGRAM URETEROSCOPY/HOLMIUM LASER/STENT PLACEMENT;  Surgeon: Crista Elliot, MD;  Location: [facility] ORS;   Service: Urology;  Laterality: Right;   INCISION AND DRAINAGE ABSCESS N/A 09/22/2014   Procedure: INCISION AND DRAINAGE ABSCESS POSTERIOR NECK;  Surgeon: Valarie Merino, MD;  Location: [facility] ORS;  Service: General;  Laterality: N/A;   INCISION AND DRAINAGE ABSCESS N/A 12/20/2015   Procedure: INCISION AND DRAINAGE POSTERIOR NECK MASS;  Surgeon: Darnell Level, MD;  Location: [facility] ORS;  Service: General;  Laterality: N/A;   INCISION AND DRAINAGE ABSCESS Left 07/10/2004   middle finger   IR IVC FILTER PLMT / S&I /IMG GUID/MOD SED  07/21/2019   IR RADIOLOGIST EVAL & MGMT  12/14/2019   IR RADIOLOGIST EVAL & MGMT  12/21/2020   IR VENOGRAM RENAL UNI RIGHT  07/21/2019   MASS EXCISION N/A 07/21/2017   Procedure: EXCISION OF BENIGN NECK LESION WITH LAYERED CLOSURE;  Surgeon: Glenna Fellows, MD;  Location: [facility];  Service: Plastics;  Laterality: N/A;   MASS EXCISION N/A 11/10/2017   Procedure: EXCISION BENIGN LESION OF THE NECK WITH LAYERED CLOSURE;  Surgeon: Glenna Fellows, MD;  Location: [facility];  Service: Plastics;  Laterality: N/A;   Patient Active Problem List   Diagnosis Date Noted   COVID-19 virus infection 04/21/2021   Left-sided weakness 04/21/2021   S/P craniotomy 06/05/2020   History of cranioplasty 06/05/2020   Peri-rectal abscess 02/14/2020   Abnormal CT scan, pelvis 02/14/2020   Pancytopenia (HCC) 12/02/2019   Nephrolithiasis 12/02/2019  Hydronephrosis with renal and ureteral calculus obstruction 12/02/2019   Rectal pain 12/02/2019   Hyperkalemia 12/02/2019   Reactive depression    Wound infection after surgery    Sleep disturbance    Dysphagia, post-stroke    Transaminitis    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   Cerebral abscess    Urinary tract infection without hematuria    Altered mental status    Primary hypercoagulable state (HCC)    Acute pulmonary embolism without acute cor pulmonale (HCC)    Deep vein thrombosis  (DVT) of non-extremity vein    Hypokalemia    Acute blood loss anemia    Leukocytosis    Endotracheal tube present    Acute respiratory failure with hypoxemia (HCC)    Stroke (cerebrum) (HCC) 07/19/2019   Pressure injury of skin 07/19/2019   Acute CVA (cerebrovascular accident) (HCC)    Encephalopathy    Dysphagia    Acute encephalopathy    Essential hypertension    Obesity 03/12/2016   Scalp abscess 12/20/2015   Neck abscess 12/20/2015   Pilonidal cyst 02/08/2013    ONSET DATE: 05/01/2023  REFERRING DIAG: I63.9 (ICD-10-CM) - Cerebrovascular accident (CVA), 2020  THERAPY DIAG:  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 Pt accompanied by: self  PERTINENT HISTORY: Patient called the doctor's office stating he needs a new brace for his left hand. He said that therapy gave him his last one about 2 or 3 years ago.   Presented 07/19/2019 with L sided weakness with diagnosis of Right middle cerebral artery stroke.  Patient underwent decompressive right hemicraniectomy with abdominal flap implant 07/19/2019 Inpatient Rehab: 09/06/2019 - 10/07/2019  Multiple outpatient therapy sessions with most recent being aquatic therapy at the beginning of 2024.  PRECAUTIONS: Fall  WEIGHT BEARING RESTRICTIONS: No  PAIN:  Are you having pain? Patient reports no pain at rest but yes to pain with mobility. No and Yes: NPRS scale: 5/10 Pain location: L shoulder Pain description: Sharp Aggravating factors: arm  dangling Relieving factors: resting it  FALLS: Has patient fallen in last 6 months? No  LIVING ENVIRONMENT:   Lives with: lives with spouse & children (35 yo daughter/14 yo son)  Patient has nurse that helps with bathing M-F x 3 hours/day.  Shower is upstairs so he generally sponge bathes.  Lives in: House Stairs: yes 3 steps to enter with rail on the R side entering; full flight of stairs (17 steps) indoors to get upstairs to shower but no longer goes up there s/p previous fall nearly 2 years go Has following equipment at home: Wheelchair (manual) and hospital bed; BSC for bathing at bedside but goes to the bathroom to toilet; quad cane  PLOF: Independent with basic ADLs, Independent with household mobility without device, Independent with transfers, Needs assistance with ADLs, and Needs assistance with homemaking Patient spends more time in [facility] at home due to moving around quicker but does walk around where/when he can daily with his quad cane  PATIENT GOALS: Replace/repair hand splint.  Put on his  L shoe (with AFO).  OBJECTIVE:   HAND DOMINANCE: Right  ADLs: Overall ADLs: Needs assistance with LB ADLs and has a 'nurse' 5x/week x 3 hours Transfers/ambulation related to ADLs: MI with quad cane, uses WC to get around quicker at home Eating: Spouse or nurse cuts his food  Grooming: Patient shaves himself with clippers ( but noted to have missed the back of his head at OT eval today) UB Dressing: Shirt sometimes gets caught on his shoulder but generally can manage a t shirt himself LB Dressing: Can do underwear and pants & R sock/shoe but can't do L sock or shoe Toileting: MI with quad cane Bathing: Can bathe UB and legs but can't wash his feet and R arm; has brush to clean his back, sits on BSC at bedside to wash his bottom  Tub Shower transfers: NA Equipment: bed side commode, Reacher, and Long handled sponge  IADLs: Shopping: Spouse Light housekeeping: Spouse/Nurse Meal Prep: Did  not ask Community mobility: Drove himself to therapy today Medication management: Did not ask Financial management: Did not ask Handwriting: 100% legible - R handed  MOBILITY STATUS: Needs Assist: unable to ambulate quickly for toielitng etc, Hx of falls, difficulty with turns, difficulty carrying objections with ambulation, and quad cane  POSTURE COMMENTS:  weight shift right Sitting balance:  WFL  ACTIVITY TOLERANCE: Activity tolerance: Seated good, standing/gait limited  FUNCTIONAL OUTCOME MEASURES: TBD  UPPER EXTREMITY ROM:    Active ROM Right eval Left eval  Shoulder flexion WFL AROM poor except for hand grip.  PROM Good elbow and distal but limited with L shoulder due to subluxation and discomfort.  Shoulder abduction    Shoulder adduction    Shoulder extension    Shoulder internal rotation    Shoulder external rotation    Elbow flexion    Elbow extension    Wrist flexion    Wrist extension    Wrist ulnar deviation    Wrist radial deviation    Wrist pronation    Wrist supination    (Blank rows = not tested)  UPPER EXTREMITY MMT:     MMT Right eval Left eval  Shoulder flexion 5/5 0-1/5 - minimal flickers of muscle movement noted except for digits ie) able to squeeze functionally but can not open digits.   Digital flexion 3/5  Shoulder abduction    Shoulder adduction    Shoulder extension    Shoulder internal rotation    Shoulder external rotation    Middle trapezius    Lower trapezius    Elbow flexion    Elbow extension    Wrist flexion    Wrist extension    Wrist ulnar deviation    Wrist radial deviation    Wrist pronation    Wrist supination    (Blank rows = not tested)  HAND FUNCTION: R UE WFL; L UE Poor - can grasp item placed in hand but can not let go and can not move limb to pick up an item  COORDINATION: R UE WFL; L UE NA/poor  SENSATION: R UE WFL; L UE impaired  EDEMA: R UE NA; L UE slight  MUSCLE TONE: LUE: Moderate, Flaccid,  and Shoulder flaccid with significant shoulder subluxation > 2 fingers widths and hand gets fisted and is difficulty to open ie) at night and then his hands hurts in the morning  COGNITION: Overall cognitive status: Impaired - Extra time needed to follow directions and answer questions at times.  VISION ASSESSMENT: Not tested  PERCEPTION: NT  PRAXIS: NT  OBSERVATIONS: Patient is a tall black male with significant left hemiplegia with flaccid shoulder who arrived today with a custom thermoplastic resting hand splint on his left hand which was sliding off and noted to have poor straps to hold it in place.  Patient demonstrates good passive range of motion of digital extension however has no active extension of digits with home resulting and fisted hand often for patient throughout the day/night.  Patient ambulates slowly and has discomfort and left shoulder which hangs due to flaccid hemiplegia at shoulder.  He has at least a 2 finger widths shoulder subluxation but can perform digital flexion to fist his hand.   TODAY'S TREATMENT:                                                                                                                              DATE: 05/04/23   Orthotic Management - Patient brought his resting hand splint which did not fit well due to ill repair of straps and his thumb was sliding out of place.  OTR was able to remove Velcro and apply new straps to improve fit but splint remains too hot in the summer and often too cold at times in the winter ie) plastic material is not comfortable.   Self care - Printed sleep position images for hemiplegia to assist patient with improved comfort and support of hemiplegic LUE and protect shoulder integrity due to severe shoulder subluxation.  He currently sleeps on his back but does not have a pillow to support his arms and demonstration provided for use of pillow to approximate shoulder joint.   PATIENT EDUCATION: Education details:  OT POC and treatment plan, Sleep positions Person educated: Patient Education method: Explanation and Handouts Education comprehension: verbalized understanding and needs further education  HOME EXERCISE PROGRAM: TBD   GOALS: Goals reviewed with patient? Yes   LONG TERM GOALS: Target date: 06/19/23  Patient will have appropriate resting hand splint and/or roll for alternating wear schedule for L UE with good fit, stretch, comfort and positioning of L digits and wrist. Baseline: Thermoplastic splint in ill-repair.  Had previous pre-fab splint > 2-3 years ago but unable to use it comfortably.  Goal status: INITIAL  2.  Patient will be independent with directing caregivers to apply splint and care for splint - appropriately cleaning and removing/donning cover to splint. Baseline: MI with current thermoplastic splint. Goal status: INITIAL  3.  Patient will be independent with HEP for L UE hemiplegia to work on shoulder approximation and comfort. Baseline: 2 finger width shoulder subluxation and pain with ambulation. Goal status: INITIAL  4.  Patient will have information re: hemi-sling options to help with shoulder positioning for improved comfort with gait.  Baseline: Previously used sling but it pulled on his neck. Goal status: INITIAL  5.  Patient will try modified techniques, AE and positions to help with L LE dressing including sock and shoe (  with AFO).  Baseline: Dependent on caregiver/spouse. Goal status: INITIAL  6.  Patient will be aware of options for AE as needed for L hemiplegia based on areas identified as problems. Baseline: In need of new reacher. Goal status: INITIAL  ASSESSMENT:  CLINICAL IMPRESSION: Patient is a 47 y.o. male who was seen today for occupational therapy evaluation for splinting needs for L UE.  He has had a prefabricated thermoplastic hand splint but the plastic material can get too hot in the summer and can be too cold in the winter.  In addition  the straps are in ill repair and the material gets smelly when he gets hot and sweaty.  OTR was able to make some modifications to get the splint to fit for the time-being but recommends a prefabricated, adjustable resting hand splint with removeable cover to allow splint to be washed, to cover plastic material (between splint and skin) and improve comfort with wear as he uses splint much of the day/night.  Patient will benefit from skilled occupational therapy services to address splinting needs and assist with possible hemisling, techniques for lower body dressing and recommendations for adaptive equipment considerations.  PERFORMANCE DEFICITS: in functional skills including ADLs, IADLs, coordination, dexterity, sensation, tone, ROM, strength, pain, muscle spasms, flexibility, Fine motor control, Gross motor control, skin integrity, and UE functional use, cognitive skills including learn and understand, and psychosocial skills including routines and behaviors.   IMPAIRMENTS: are limiting patient from ADLs, rest and sleep, leisure, and social participation.   CO-MORBIDITIES: may have co-morbidities  that affects occupational performance. Patient will benefit from skilled OT to address above impairments and improve overall function.  MODIFICATION OR ASSISTANCE TO COMPLETE EVALUATION: No modification of tasks or assist necessary to complete an evaluation.  OT OCCUPATIONAL PROFILE AND HISTORY: Problem focused assessment: Including review of records relating to presenting problem.  CLINICAL DECISION MAKING: LOW - limited treatment options, no task modification necessary  REHAB POTENTIAL: Good  EVALUATION COMPLEXITY: Low    PLAN:  OT FREQUENCY: 1x/week  OT DURATION: 6 weeks  PLANNED INTERVENTIONS: self care/ADL training, therapeutic exercise, therapeutic activity, neuromuscular re-education, manual therapy, passive range of motion, splinting, patient/family education, coping strategies  training, and DME and/or AE instructions  RECOMMENDED OTHER SERVICES: NA at this time  CONSULTED AND AGREED WITH PLAN OF CARE: Patient  PLAN FOR NEXT SESSION: Consider fitting prefabricated resting hand splint and explore hemi Sling options.  AE considerations to put his foot in his shoe with brace ie) foot funnel and use of hospital bed elevation/lowering.  HEP ideas for L UE positioning and AAROM.   Victorino Sparrow, OT 05/04/2023, 4:58 PM

## 2023-05-07 ENCOUNTER — Ambulatory Visit (AMBULATORY_SURGERY_CENTER): Payer: Medicare Other

## 2023-05-07 VITALS — Ht 72.0 in | Wt 285.0 lb

## 2023-05-07 DIAGNOSIS — Z1211 Encounter for screening for malignant neoplasm of colon: Secondary | ICD-10-CM

## 2023-05-07 NOTE — Progress Notes (Signed)
No egg or soy allergy known to patient  No issues known to pt with past sedation with any surgeries or procedures Patient denies ever being told they had issues or difficulty with intubation  No FH of Malignant Hyperthermia Pt is not on diet pills Pt is not on  home 02  Pt is not on blood thinners  Pt denies issues with constipation  No A fib or A flutter Have any cardiac testing pending-- no Uses cane for ambulation left sided weakness due to stroke   PV completed with patient. Prep instructions sent to patients home address in mychart pt made aware of increased mailing times. Advised him to log onto mychart to see his instructions or he can come to the office to pick them up if he does get them by next week. Pt request that his wife call us back so that we can review instructions with her due to him having a stroke. Pt has office number if he or his wife have any questions.   Patient's chart reviewed by Cathlyn Parsons CNRA prior to previsit and patient appropriate for the [facility].  Previsit completed and red dot placed by patient's name on their procedure day (on provider's schedule).

## 2023-05-11 ENCOUNTER — Ambulatory Visit: Payer: Medicare Other | Admitting: Physical Therapy

## 2023-05-11 ENCOUNTER — Other Ambulatory Visit: Payer: Self-pay

## 2023-05-11 ENCOUNTER — Encounter: Payer: Self-pay | Admitting: Physical Therapy

## 2023-05-11 VITALS — BP 109/81 | HR 68

## 2023-05-11 DIAGNOSIS — R2689 Other abnormalities of gait and mobility: Secondary | ICD-10-CM

## 2023-05-11 DIAGNOSIS — I69354 Hemiplegia and hemiparesis following cerebral infarction affecting left non-dominant side: Secondary | ICD-10-CM | POA: Diagnosis not present

## 2023-05-11 DIAGNOSIS — R2681 Unsteadiness on feet: Secondary | ICD-10-CM

## 2023-05-11 NOTE — Therapy (Signed)
OUTPATIENT PHYSICAL THERAPY NEURO EVALUATION   Patient Name: Matthew Black MRN: [id number] DOB:1976/10/20, 47 y.o., male Today's Date: 05/11/2023   PCP: Esperanza Richters, PA-C REFERRING PROVIDER: Horton Chin, MD  END OF SESSION:  PT End of Session - 05/11/23 1450     Visit Number 1    Number of Visits 1    Date for PT Re-Evaluation 05/10/24    Authorization Type Medicare    PT Start Time 1447    PT Stop Time 1535    PT Time Calculation (min) 48 min    Equipment Utilized During Treatment Gait belt    Activity Tolerance Patient tolerated treatment well    Behavior During Therapy WFL for tasks assessed/performed             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   Stroke (HCC)    RICA, R A1, R MCA occlusion 07/19/19   Past Surgical History:  Procedure Laterality Date   CRANIOPLASTY Right 06/05/2020   Procedure: CRANIOPLASTY;  Surgeon: Lisbeth Renshaw, MD;  Location: [facility] OR;  Service: Neurosurgery;  Laterality: Right;  right   CRANIOTOMY Right 07/19/2019   Procedure: RIGHT HEMI-CRANIECTOMY With implantation of skull flap to abdominal wall;  Surgeon: Lisbeth Renshaw, MD;  Location: [facility] OR;  Service: Neurosurgery;  Laterality: Right;   CYST EXCISION N/A 10/08/2016   Procedure: EXCISION OF POSTERIOR NECK CYST;  Surgeon: Berna Bue, MD;  Location: [facility] ORS;  Service: General;  Laterality: N/A;   CYSTOSCOPY/URETEROSCOPY/HOLMIUM LASER/STENT PLACEMENT Right 03/16/2020   Procedure: CYSTOSCOPY RIGHT RETROGRADE PYELOGRAM URETEROSCOPY/HOLMIUM LASER/STENT PLACEMENT;  Surgeon: Crista Elliot, MD;  Location: [facility]  ORS;  Service: Urology;  Laterality: Right;   INCISION AND DRAINAGE ABSCESS N/A 09/22/2014   Procedure: INCISION AND DRAINAGE ABSCESS POSTERIOR NECK;  Surgeon: Valarie Merino, MD;  Location: [facility] OR[facility];  Service: General;  Laterality: N/A;   INCISION AND DRAINAGE ABSCESS N/A 12/20/2015   Procedure: INCISION AND DRAINAGE POSTERIOR NECK MASS;  Surgeon: Darnell Level, MD;  Location: [facility];  Service: General;  Laterality: N/A;   INCISION AND DRAINAGE ABSCESS Left 07/10/2004   middle finger   IR IVC FILTER PLMT / S&I /IMG GUID/MOD SED  07/21/2019   IR RADIOLOGIST EVAL & MGMT  12/14/2019   IR RADIOLOGIST EVAL & MGMT  12/21/2020   IR VENOGRAM RENAL UNI RIGHT  07/21/2019   MASS EXCISION N/A 07/21/2017   Procedure: EXCISION OF BENIGN NECK LESION WITH LAYERED CLOSURE;  Surgeon: Glenna Fellows, MD;  Location: [facility];  Service: Plastics;  Laterality: N/A;   MASS EXCISION N/A 11/10/2017   Procedure: EXCISION BENIGN LESION OF THE NECK WITH LAYERED CLOSURE;  Surgeon: Glenna Fellows, MD;  Location: [facility];  Service: Plastics;  Laterality: N/A;   Patient Active Problem List   Diagnosis Date Noted   COVID-19 virus infection 04/21/2021   Left-sided weakness 04/21/2021   S/P craniotomy 06/05/2020   History of cranioplasty 06/05/2020   Peri-rectal abscess 02/14/2020   Abnormal CT scan, pelvis 02/14/2020   Pancytopenia (HCC) 12/02/2019  Nephrolithiasis 12/02/2019   Hydronephrosis with renal and ureteral calculus obstruction 12/02/2019   Rectal pain 12/02/2019   Hyperkalemia 12/02/2019   Reactive depression    Wound infection after surgery    Sleep disturbance    Dysphagia, post-stroke    Transaminitis    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   Cerebral abscess    Urinary tract infection without hematuria    Altered mental status    Primary hypercoagulable state (HCC)    Acute pulmonary embolism without acute cor pulmonale (HCC)    Deep vein  thrombosis (DVT) of non-extremity vein    Hypokalemia    Acute blood loss anemia    Leukocytosis    Endotracheal tube present    Acute respiratory failure with hypoxemia (HCC)    Stroke (cerebrum) (HCC) 07/19/2019   Pressure injury of skin 07/19/2019   Acute CVA (cerebrovascular accident) (HCC)    Encephalopathy    Dysphagia    Acute encephalopathy    Essential hypertension    Obesity 03/12/2016   Scalp abscess 12/20/2015   Neck abscess 12/20/2015   Pilonidal cyst 02/08/2013    ONSET DATE: 03/16/2023 (referral date)  REFERRING DIAG: W09.811 (ICD-10-CM) - 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 Patient was previously seen at this facility March and April of this year. Patient reports that he feels like since this time he needs to work on getting his endurance up. Patient reports that he has been working on some of the exercises he was given last time. Patient reports that he is walking every day around his neighborhood. He is unable to say if feels like his endurance has had any changes since he was last here.   Pt accompanied by: self  PERTINENT HISTORY: R CVA s/p hemicaniectomy 07/19/19 with abdominal flap implant.  PMH: hx of seizures and headaches s/p CVA, hx of DVT with IVC filter placement . Pt s/p botox on 01/21/22.  PAIN:  Are you having pain? No  PRECAUTIONS: Fall  WEIGHT BEARING RESTRICTIONS: No  FALLS: Has patient fallen in last 6 months?  No   LIVING ENVIRONMENT: Lives with: lives with their spouse Lives in: House/apartment Stairs: Yes: Internal: 12 steps; on right going up and External: 3 steps; bilateral but cannot reach both Has following equipment at home: Quad cane large base, Wheelchair (manual), and bed side commode  PLOF: Independent with gait, Independent with transfers, and Requires assistive device for independence  PATIENT GOALS: "work on my endurance and walk without my brace"  OBJECTIVE:   DIAGNOSTIC FINDINGS:   Brain MRI 04/21/2021 IMPRESSION: 1. No emergent finding. 2. Sequela of large remote right MCA territory infarct.  COGNITION: Overall cognitive status: Within functional limits for tasks assessed - patient reports that his memory feels like it is getting back to where it was before the stroke   SENSATION: Reports mild numbness on L hemiparetic side  POSTURE: rounded shoulders, forward head, and posterior pelvic tilt  LOWER EXTREMITY ROM:     Passive  Right Eval Left Eval  Hip flexion  Increased tightness/tone  Hip extension    Hip abduction    Hip adduction    Hip internal rotation    Hip external rotation    Knee flexion    Knee extension  Increased tightness/tone  Ankle dorsiflexion    Ankle plantarflexion    Ankle inversion    Ankle eversion     (Blank rows = not tested)  LOWER EXTREMITY MMT:    MMT Right Eval Left Eval  Hip flexion 5 2  Hip extension    Hip abduction    Hip adduction    Hip internal rotation    Hip external rotation    Knee flexion 5 2  Knee extension 5 2+  Ankle dorsiflexion 5 0  Ankle plantarflexion    Ankle inversion    Ankle eversion    (Blank rows = not tested)  BED MOBILITY:  Sit to supine Min A Supine to sit Min A Rolling to Right Modified independence Rolling to Left Mod A  Patient reports no changes in this area since last time. Reports still needs help with aid.   TRANSFERS: Assistive device utilized: Chiropractor   Sit to stand: Modified independence - use of arm rest on R side Stand to sit: Modified independence -  use of arm rest on R side Chair to chair: Modified independence Floor:  not assessed at eval  GAIT: Gait pattern:  LLE ER, decreased step length- Left, decreased hip/knee flexion- Left, decreased ankle dorsiflexion- Left, and abducted- Left Distance walked: 2 x 60 feet, 2 x 50 feet, 2 x 10 feet Assistive device utilized: Quad cane large base and L AFO Level of assistance: Modified independence Comments: reduced gait speed, fatigues with longer duration of ambulation, functional similar to when last seen for PT  FUNCTIONAL TESTS:    [facility] PT Assessment - 05/11/23 0001       Standardized Balance Assessment   Standardized Balance Assessment Timed Up and Go Test;Five Times Sit to Stand;10 meter walk test    Five times sit to stand comments  16.84 sec   with use of RUE on arm rests (modI)   10 Meter Walk 0.78   ft/sec with LBQC (modI)     Timed Up and Go Test   TUG Normal TUG    Normal TUG (seconds) 37   with LBQC (modI)           TODAY'S TREATMENT:  PT Evaluation only  PATIENT EDUCATION: Education details: POC, reason not picking up for PT, examination findings/results Person educated: Patient Education method: Medical illustrator Education comprehension: verbalized understanding  HOME EXERCISE PROGRAM: Recommended continuing former HEP provided at last discharge  GOALS: Not indicated at this time  ASSESSMENT:  CLINICAL IMPRESSION: Patient is a 47 year old male referred with chronic CVA with PMH of  R CVA s/p hemicaniectomy 8/20 with abdominal flap implant, seizures and headaches s/p CVA, and hx of DVT with IVC filter placement. Patient functionally is at baseline from when last discharged from PT about 2 months ago. No major medical  changes have occurred. Gait speed, TUG, and 5xSTS are nearly identical to when last discharge. Patient reports that he is wanting to ambulate without AD; however, therapist educates that at this time the use of this device is most appropriate as there have not been major changes since last bout of therapy. For these reasons, skilled physical therapy services not indicated at this time as patient is a functional baseline and there have been no medical changes to indicate need to continue. Patient is emotion at end of session and therapist sits with patient until he feels like he is ready to leave. Former therapist had recommended neuro psych for patient, but patient will not answer when therapist asks if patient has followed up with them. Physical therapy not indicated at this time.   OBJECTIVE IMPAIRMENTS: Abnormal gait, decreased balance, decreased coordination, decreased endurance, decreased mobility, difficulty walking, decreased ROM, decreased strength, impaired perceived functional ability, impaired flexibility, impaired tone, and impaired UE functional use.   ACTIVITY LIMITATIONS: carrying, lifting, bending, squatting, stairs, bed mobility, bathing, dressing, reach over head, and hygiene/grooming  PARTICIPATION LIMITATIONS: meal prep, cleaning, laundry, community activity, and yard work  PERSONAL FACTORS: Time since onset of injury/illness/exacerbation and 3+ comorbidities: see above    CLINICAL DECISION MAKING: Stable/uncomplicated  EVALUATION COMPLEXITY: Low  PLAN:  PLAN FOR NEXT SESSION: Not indicated - not picking up for PT as no functional change at this time  Carmelia Bake, PT, DPT 05/11/2023, 4:57 PM

## 2023-05-13 ENCOUNTER — Ambulatory Visit: Payer: Medicare Other | Admitting: Occupational Therapy

## 2023-05-13 DIAGNOSIS — Z741 Need for assistance with personal care: Secondary | ICD-10-CM

## 2023-05-13 DIAGNOSIS — I69354 Hemiplegia and hemiparesis following cerebral infarction affecting left non-dominant side: Secondary | ICD-10-CM

## 2023-05-13 DIAGNOSIS — M6281 Muscle weakness (generalized): Secondary | ICD-10-CM

## 2023-05-13 DIAGNOSIS — R278 Other lack of coordination: Secondary | ICD-10-CM

## 2023-05-13 NOTE — Therapy (Signed)
OUTPATIENT OCCUPATIONAL THERAPY NEURO TREATMENT  Patient Name: Matthew Black MRN: [id number] DOB:[birth year], 47 y.o., male Today's Date: 05/13/2023  PCP: Esperanza Richters, PA-C REFERRING PROVIDER: Horton Chin, MD  END OF SESSION:  OT End of Session - 05/13/23 1404     Visit Number 2    Number of Visits 7    Date for OT Re-Evaluation 06/19/23    Authorization Type Medicare A & B  20% coinsurance    Progress Note Due on Visit 10    OT Start Time 1405    OT Stop Time 1445    OT Time Calculation (min) 40 min    Activity Tolerance Patient tolerated treatment well    Behavior During Therapy [facility] for tasks assessed/performed             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   Stroke (HCC)    RICA, R A1, R MCA occlusion 07/19/19   Past Surgical History:  Procedure Laterality Date   CRANIOPLASTY Right 06/05/2020   Procedure: CRANIOPLASTY;  Surgeon: Lisbeth Renshaw, MD;  Location: [facility] OR;  Service: Neurosurgery;  Laterality: Right;  right   CRANIOTOMY Right 07/19/2019   Procedure: RIGHT HEMI-CRANIECTOMY With implantation of skull flap to abdominal wall;  Surgeon: Lisbeth Renshaw, MD;  Location: [facility] OR;  Service: Neurosurgery;  Laterality: Right;   CYST EXCISION N/A 10/08/2016   Procedure: EXCISION OF POSTERIOR NECK CYST;  Surgeon: Berna Bue, MD;  Location: [facility] ORS;  Service: General;  Laterality: N/A;   CYSTOSCOPY/URETEROSCOPY/HOLMIUM LASER/STENT PLACEMENT Right 03/16/2020   Procedure: CYSTOSCOPY RIGHT RETROGRADE PYELOGRAM URETEROSCOPY/HOLMIUM LASER/STENT PLACEMENT;  Surgeon: Crista Elliot, MD;   Location: [facility] ORS;  Service: Urology;  Laterality: Right;   INCISION AND DRAINAGE ABSCESS N/A 09/22/2014   Procedure: INCISION AND DRAINAGE ABSCESS POSTERIOR NECK;  Surgeon: Valarie Merino, MD;  Location: [facility] ORS;  Service: General;  Laterality: N/A;   INCISION AND DRAINAGE ABSCESS N/A 12/20/2015   Procedure: INCISION AND DRAINAGE POSTERIOR NECK MASS;  Surgeon: Darnell Level, MD;  Location: [facility] ORS;  Service: General;  Laterality: N/A;   INCISION AND DRAINAGE ABSCESS Left 07/10/2004   middle finger   IR IVC FILTER PLMT / S&I /IMG GUID/MOD SED  07/21/2019   IR RADIOLOGIST EVAL & MGMT  12/14/2019   IR RADIOLOGIST EVAL & MGMT  12/21/2020   IR VENOGRAM RENAL UNI RIGHT  07/21/2019   MASS EXCISION N/A 07/21/2017   Procedure: EXCISION OF BENIGN NECK LESION WITH LAYERED CLOSURE;  Surgeon: Glenna Fellows, MD;  Location: [facility];  Service: Plastics;  Laterality: N/A;   MASS EXCISION N/A 11/10/2017   Procedure: EXCISION BENIGN LESION OF THE NECK WITH LAYERED CLOSURE;  Surgeon: Glenna Fellows, MD;  Location:  SURGERY CENTER;  Service: Plastics;  Laterality: N/A;   Patient Active Problem List   Diagnosis Date Noted   COVID-19 virus infection 04/21/2021   Left-sided weakness 04/21/2021   S/P craniotomy 06/05/2020   History of cranioplasty 06/05/2020   Peri-rectal abscess 02/14/2020   Abnormal CT scan, pelvis 02/14/2020   Pancytopenia (  HCC) 12/02/2019   Nephrolithiasis 12/02/2019   Hydronephrosis with renal and ureteral calculus obstruction 12/02/2019   Rectal pain 12/02/2019   Hyperkalemia 12/02/2019   Reactive depression    Wound infection after surgery    Sleep disturbance    Dysphagia, post-stroke    Transaminitis    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   Cerebral abscess    Urinary tract infection without hematuria    Altered mental status    Primary hypercoagulable state (HCC)    Acute pulmonary embolism without acute cor pulmonale (HCC)    Deep  vein thrombosis (DVT) of non-extremity vein    Hypokalemia    Acute blood loss anemia    Leukocytosis    Endotracheal tube present    Acute respiratory failure with hypoxemia (HCC)    Stroke (cerebrum) (HCC) 07/19/2019   Pressure injury of skin 07/19/2019   Acute CVA (cerebrovascular accident) (HCC)    Encephalopathy    Dysphagia    Acute encephalopathy    Essential hypertension    Obesity 03/12/2016   Scalp abscess 12/20/2015   Neck abscess 12/20/2015   Pilonidal cyst 02/08/2013    ONSET DATE: 05/01/2023  REFERRING DIAG: I63.9 (ICD-10-CM) - Cerebrovascular accident (CVA), 2020  THERAPY DIAG:  No diagnosis found.  Rationale for Evaluation and Treatment: Rehabilitation  SUBJECTIVE:   SUBJECTIVE STATEMENT: Patient was provided information about obtaining a prefabricated splint through orthotist and copayment of $52 with patient agreeing to purchase.    Pt accompanied by: self  PERTINENT HISTORY: Patient called the doctor's office stating he needs a new brace for his left hand. He said that therapy gave him his last one about 2 or 3 years ago.   Presented 07/19/2019 with L sided weakness with diagnosis of Right middle cerebral artery stroke.  Patient underwent decompressive right hemicraniectomy with abdominal flap implant 07/19/2019 Inpatient Rehab: 09/06/2019 - 10/07/2019  Multiple outpatient therapy sessions with most recent being aquatic therapy at the beginning of 2024.  PRECAUTIONS: Fall  WEIGHT BEARING RESTRICTIONS: No  PAIN:  Are you having pain? Patient reports no pain at rest but yes to pain with mobility. No and Yes: NPRS scale: 5/10 Pain location: L shoulder Pain description: Sharp Aggravating factors: arm dangling Relieving factors: resting it  FALLS: Has patient fallen in last 6 months? No  LIVING ENVIRONMENT:   Lives with: lives with spouse & children (14 yo daughter/14 yo son)  Patient has nurse that helps with bathing M-F x 3 hours/day.  Shower is  upstairs so he generally sponge bathes.  Lives in: House Stairs: yes 3 steps to enter with rail on the R side entering; full flight of stairs (17 steps) indoors to get upstairs to shower but no longer goes up there s/p previous fall nearly 2 years go Has following equipment at home: Wheelchair (manual) and hospital bed; BSC for bathing at bedside but goes to the bathroom to toilet; quad cane  PLOF: Independent with basic ADLs, Independent with household mobility without device, Independent with transfers, Needs assistance with ADLs, and Needs assistance with homemaking Patient spends more time in [facility] at home due to moving around quicker but does walk around where/when he can daily with his quad cane  PATIENT GOALS: Replace/repair hand splint.  Put on his L shoe (with AFO).  OBJECTIVE:   HAND DOMINANCE: Right  ADLs: Overall ADLs: Needs assistance with LB ADLs and has a 'nurse' 5x/week x 3 hours Transfers/ambulation related to ADLs: MI with quad cane, uses WC  to get around quicker at home Eating: Spouse or nurse cuts his food  Grooming: Patient shaves himself with clippers ( but noted to have missed the back of his head at OT eval today) UB Dressing: Shirt sometimes gets caught on his shoulder but generally can manage a t shirt himself LB Dressing: Can do underwear and pants & R sock/shoe but can't do L sock or shoe Toileting: MI with quad cane Bathing: Can bathe UB and legs but can't wash his feet and R arm; has brush to clean his back, sits on BSC at bedside to wash his bottom  Tub Shower transfers: NA Equipment: bed side commode, Reacher, and Long handled sponge  IADLs: Shopping: Spouse Light housekeeping: Spouse/Nurse Meal Prep: Did not ask Community mobility: Drove himself to therapy today Medication management: Did not ask Financial management: Did not ask Handwriting: 100% legible - R handed  MOBILITY STATUS: Needs Assist: unable to ambulate quickly for toielitng etc, Hx of  falls, difficulty with turns, difficulty carrying objections with ambulation, and quad cane  POSTURE COMMENTS:  weight shift right Sitting balance:  WFL  ACTIVITY TOLERANCE: Activity tolerance: Seated good, standing/gait limited  FUNCTIONAL OUTCOME MEASURES: TBD  UPPER EXTREMITY ROM:    Active ROM Right eval Left eval  Shoulder flexion WFL AROM poor except for hand grip.  PROM Good elbow and distal but limited with L shoulder due to subluxation and discomfort.  Shoulder abduction    Shoulder adduction    Shoulder extension    Shoulder internal rotation    Shoulder external rotation    Elbow flexion    Elbow extension    Wrist flexion    Wrist extension    Wrist ulnar deviation    Wrist radial deviation    Wrist pronation    Wrist supination    (Blank rows = not tested)  UPPER EXTREMITY MMT:     MMT Right eval Left eval  Shoulder flexion 5/5 0-1/5 - minimal flickers of muscle movement noted except for digits ie) able to squeeze functionally but can not open digits.   Digital flexion 3/5  Shoulder abduction    Shoulder adduction    Shoulder extension    Shoulder internal rotation    Shoulder external rotation    Middle trapezius    Lower trapezius    Elbow flexion    Elbow extension    Wrist flexion    Wrist extension    Wrist ulnar deviation    Wrist radial deviation    Wrist pronation    Wrist supination    (Blank rows = not tested)  HAND FUNCTION: R UE WFL; L UE Poor - can grasp item placed in hand but can not let go and can not move limb to pick up an item  COORDINATION: R UE WFL; L UE NA/poor  SENSATION: R UE WFL; L UE impaired  EDEMA: R UE NA; L UE slight  MUSCLE TONE: LUE: Moderate, Flaccid, and Shoulder flaccid with significant shoulder subluxation > 2 fingers widths and hand gets fisted and is difficulty to open ie) at night and then his hands hurts in the morning  COGNITION: Overall cognitive status: Impaired - Extra time needed to  follow directions and answer questions at times.  VISION ASSESSMENT: Not tested  PERCEPTION: NT  PRAXIS: NT  Evaluation OBSERVATIONS: Patient is a tall black male with significant left hemiplegia with flaccid shoulder who arrived today with a custom thermoplastic resting hand splint on his left hand which was  sliding off and noted to have poor straps to hold it in place.  Patient demonstrates good passive range of motion of digital extension however has no active extension of digits with home resulting and fisted hand often for patient throughout the day/night.  Patient ambulates slowly and has discomfort and left shoulder which hangs due to flaccid hemiplegia at shoulder.  He has at least a 2 finger widths shoulder subluxation but can perform digital flexion to fist his hand.   TODAY'S TREATMENT:                                                                                                                              DATE: 05/13/23    Self care - Majority of session is spent focusing on ability to don his own shoe.  Patient is not able to pick his foot up high enough to put it inside his shoe from seated position in a chair.  Attempts at using the foot funnel were also unsuccessful as he could not push his foot into the shoe.  Patient demonstrated his attempts at standing to lift his foot and put it in his shoe but still could not get his foot in all the way.  Even crossing his L foot over his R ankle, he was not able to get his shoe over his foot.    Finally patient was engaged in working from seated position on a hospital bed which he has at home.  He was encouraged to scoot back on the bed to allow his left leg to be pulled up into external rotation with is foot hanging off the edge of the bed slightly.  This position allowed him to access to his foot and this the laces opened wide enough, he is able to put the shoe over his foot and AFO.  Then he worked at pulling the sole of the shoe back  until her could grab the loop at the back of the shoe to pull it behind his heel.  Patient was able to perform this task 2 out of 4 times without physical assistance from therapist after minimal physical assistance the first 2 attempts.  Therapist did switch his shoelaces for curly elastic shoelaces which he is able to pull tight enough to be comfortable and keep shoe in place.  In addition therapist worked on fastening the ankle strap of his AFO to increase ease with donning shoe also.  Orthotic Management - Patient is provided information about co-pay in order to order prefabricated resting hand splint, with patient in agreement.  Information is shared with orthotist to order splint and patient's next appointment is shared for delivery.   PATIENT EDUCATION: Education details: ADL comp strategy for donning shoe Person educated: Patient Education method: Explanation, Demonstration, Tactile cues, and Verbal cues Education comprehension: verbalized understanding, returned demonstration, verbal cues required, tactile cues required, and needs further education  HOME EXERCISE PROGRAM: TBD   GOALS: Goals reviewed with  patient? Yes   LONG TERM GOALS: Target date: 06/19/23  Patient will have appropriate resting hand splint and/or roll for alternating wear schedule for L UE with good fit, stretch, comfort and positioning of L digits and wrist. Baseline: Thermoplastic splint in ill-repair.  Had previous pre-fab splint > 2-3 years ago but unable to use it comfortably.  Goal status: IN PROGRESS  2.  Patient will be independent with directing caregivers to apply splint and care for splint - appropriately cleaning and removing/donning cover to splint. Baseline: MI with current thermoplastic splint. Goal status: INITIAL  3.  Patient will be independent with HEP for L UE hemiplegia to work on shoulder approximation and comfort. Baseline: 2 finger width shoulder subluxation and pain with  ambulation. Goal status: INITIAL  4.  Patient will have information re: hemi-sling options to help with shoulder positioning for improved comfort with gait.  Baseline: Previously used sling but it pulled on his neck. Goal status: INITIAL  5.  Patient will try modified techniques, AE and positions to help with L LE dressing including sock and shoe (with AFO).  Baseline: Dependent on caregiver/spouse. Goal status: IN PROGRESS  6.  Patient will be aware of options for AE as needed for L hemiplegia based on areas identified as problems. Baseline: In need of new reacher. Goal status: INITIAL  ASSESSMENT:  CLINICAL IMPRESSION: Patient is seen today for occupational therapy treatment today with some good success at teaching him how to don his L shoe on his own from seated position at EOB with L leg pulled up on the bed.  Patient was particularly pleased with himself for being able to put his shoe on without physical assistance a couple of times today and agrees to practice the process at home this week.  Therapist also shared information about ordering a prefabricated resting hand splint as patient will have a co-pay.  Patient is in agreement and orthotist is provided information to order splint.  Patient will benefit from continued skilled occupational therapy services to address splinting needs and assist with possible hemisling, techniques for lower body dressing and recommendations for adaptive equipment considerations.  PERFORMANCE DEFICITS: in functional skills including ADLs, IADLs, coordination, dexterity, sensation, tone, ROM, strength, pain, muscle spasms, flexibility, Fine motor control, Gross motor control, skin integrity, and UE functional use, cognitive skills including learn and understand, and psychosocial skills including routines and behaviors.   IMPAIRMENTS: are limiting patient from ADLs, rest and sleep, leisure, and social participation.   CO-MORBIDITIES: may have co-morbidities   that affects occupational performance. Patient will benefit from skilled OT to address above impairments and improve overall function.  REHAB POTENTIAL: Good   PLAN:  OT FREQUENCY: 1x/week  OT DURATION: 6 weeks  PLANNED INTERVENTIONS: self care/ADL training, therapeutic exercise, therapeutic activity, neuromuscular re-education, manual therapy, passive range of motion, splinting, patient/family education, coping strategies training, and DME and/or AE instructions  RECOMMENDED OTHER SERVICES: NA at this time  CONSULTED AND AGREED WITH PLAN OF CARE: Patient  PLAN FOR NEXT SESSION: Fit prefabricated resting hand splint as available and explore hemi Sling options.  Review ADL comp strategies  to put his shoe on with AFO ie) use of hospital bed   HEP ideas for L UE positioning and AAROM.   Victorino Sparrow, OT 05/13/2023, 5:27 PM

## 2023-05-14 ENCOUNTER — Telehealth: Payer: Self-pay | Admitting: Internal Medicine

## 2023-05-14 ENCOUNTER — Encounter: Payer: Self-pay | Admitting: Certified Registered Nurse Anesthetist

## 2023-05-14 NOTE — Telephone Encounter (Signed)
Resent instructions and talked pt how to check letters in his MyChart.  Blenda Mounts had already mailed him him instructions as well

## 2023-05-14 NOTE — Telephone Encounter (Signed)
Inbound call from patient stating he has not received prep instructions through mychart. Also requesting a call back regarding instructions. Please advise, thank you.

## 2023-05-15 ENCOUNTER — Other Ambulatory Visit: Payer: Self-pay | Admitting: Physical Medicine and Rehabilitation

## 2023-05-15 DIAGNOSIS — I639 Cerebral infarction, unspecified: Secondary | ICD-10-CM

## 2023-05-20 ENCOUNTER — Ambulatory Visit (AMBULATORY_SURGERY_CENTER): Payer: Medicare Other | Admitting: Internal Medicine

## 2023-05-20 ENCOUNTER — Ambulatory Visit: Payer: Medicare Other | Admitting: Occupational Therapy

## 2023-05-20 ENCOUNTER — Encounter: Payer: Self-pay | Admitting: Internal Medicine

## 2023-05-20 VITALS — BP 130/72 | HR 76 | Temp 98.0°F | Resp 17 | Ht 72.0 in | Wt 285.0 lb

## 2023-05-20 DIAGNOSIS — Z1211 Encounter for screening for malignant neoplasm of colon: Secondary | ICD-10-CM

## 2023-05-20 DIAGNOSIS — D123 Benign neoplasm of transverse colon: Secondary | ICD-10-CM | POA: Diagnosis not present

## 2023-05-20 DIAGNOSIS — D124 Benign neoplasm of descending colon: Secondary | ICD-10-CM

## 2023-05-20 DIAGNOSIS — D128 Benign neoplasm of rectum: Secondary | ICD-10-CM

## 2023-05-20 MED ORDER — SODIUM CHLORIDE 0.9 % IV SOLN
500.0000 mL | Freq: Once | INTRAVENOUS | Status: DC
Start: 2023-05-20 — End: 2023-05-20

## 2023-05-20 NOTE — Progress Notes (Signed)
Pt's states no medical or surgical changes since previsit or office visit. 

## 2023-05-20 NOTE — Progress Notes (Unsigned)
Pt 100 percent positioned by anesthesia and could not help in process of turning to lateral position for scope. vss

## 2023-05-20 NOTE — Progress Notes (Signed)
Called to room to assist during endoscopic procedure.  Patient ID and intended procedure confirmed with present staff. Received instructions for my participation in the procedure from the performing physician.  

## 2023-05-20 NOTE — Patient Instructions (Addendum)
Handouts Provided:  Polyps  YOU HAD AN ENDOSCOPIC PROCEDURE TODAY AT THE [facility]:   Refer to the procedure report that was given to you for any specific questions about what was found during the examination.  If the procedure report does not answer your questions, please call your gastroenterologist to clarify.  If you requested that your care partner not be given the details of your procedure findings, then the procedure report has been included in a sealed envelope for you to review at your convenience later.  YOU SHOULD EXPECT: Some feelings of bloating in the abdomen. Passage of more gas than usual.  Walking can help get rid of the air that was put into your GI tract during the procedure and reduce the bloating. If you had a lower endoscopy (such as a colonoscopy or flexible sigmoidoscopy) you may notice spotting of blood in your stool or on the toilet paper. If you underwent a bowel prep for your procedure, you may not have a normal bowel movement for a few days.  Please Note:  You might notice some irritation and congestion in your nose or some drainage.  This is from the oxygen used during your procedure.  There is no need for concern and it should clear up in a day or so.  SYMPTOMS TO REPORT IMMEDIATELY:  Following lower endoscopy (colonoscopy or flexible sigmoidoscopy):  Excessive amounts of blood in the stool  Significant tenderness or worsening of abdominal pains  Swelling of the abdomen that is new, acute  Fever of 100F or higher  For urgent or emergent issues, a gastroenterologist can be reached at any hour by calling [phone]. Do not use MyChart messaging for urgent concerns.    DIET:  We do recommend a small meal at first, but then you may proceed to your regular diet.  Drink plenty of fluids but you should avoid alcoholic beverages for 24 hours.  ACTIVITY:  You should plan to take it easy for the rest of today and you should NOT DRIVE or use heavy  machinery until tomorrow (because of the sedation medicines used during the test).    FOLLOW UP: Our staff will call the number listed on your records the next business day following your procedure.  We will call around 7:15- 8:00 am to check on you and address any questions or concerns that you may have regarding the information given to you following your procedure. If we do not reach you, we will leave a message.     If any biopsies were taken you will be contacted by phone or by letter within the next 1-3 weeks.  Please call us at [phone] if you have not heard about the biopsies in 3 weeks.    SIGNATURES/CONFIDENTIALITY: You and/or your care partner have signed paperwork which will be entered into your electronic medical record.  These signatures attest to the fact that that the information above on your After Visit Summary has been reviewed and is understood.  Full responsibility of the confidentiality of this discharge information lies with you and/or your care-partner.  

## 2023-05-20 NOTE — Progress Notes (Unsigned)
[facility] History and Physical   Primary Care Physician:  Esperanza Richters, PA-C   Reason for Procedure:   Colon cancer screening  Plan:    colonoscopy     HPI: Matthew Black is a 47 y.o. 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   Stroke (HCC)    RICA, R A1, R MCA occlusion 07/19/19    Past Surgical History:  Procedure Laterality Date   CRANIOPLASTY Right 06/05/2020   Procedure: CRANIOPLASTY;  Surgeon: Lisbeth Renshaw, MD;  Location: [facility] OR;  Service: Neurosurgery;  Laterality: Right;  right   CRANIOTOMY Right 07/19/2019   Procedure: RIGHT HEMI-CRANIECTOMY With implantation of skull flap to abdominal wall;  Surgeon: Lisbeth Renshaw, MD;  Location: [facility] OR;  Service: Neurosurgery;  Laterality: Right;   CYST EXCISION N/A 10/08/2016   Procedure: EXCISION OF POSTERIOR NECK CYST;  Surgeon: Berna Bue, MD;  Location: [facility] ORS;  Service: General;  Laterality: N/A;   CYSTOSCOPY/URETEROSCOPY/HOLMIUM LASER/STENT PLACEMENT Right 03/16/2020   Procedure: CYSTOSCOPY RIGHT RETROGRADE PYELOGRAM URETEROSCOPY/HOLMIUM LASER/STENT PLACEMENT;  Surgeon: Crista Elliot, MD;  Location: [facility];  Service: Urology;  Laterality: Right;   INCISION AND DRAINAGE ABSCESS N/A 09/22/2014   Procedure: INCISION AND DRAINAGE ABSCESS POSTERIOR NECK;  Surgeon: Valarie Merino, MD;  Location: [facility];  Service: General;  Laterality: N/A;   INCISION AND DRAINAGE ABSCESS N/A 12/20/2015   Procedure: INCISION AND DRAINAGE POSTERIOR NECK MASS;  Surgeon: Darnell Level, MD;  Location: [facility];  Service:  General;  Laterality: N/A;   INCISION AND DRAINAGE ABSCESS Left 07/10/2004   middle finger   IR IVC FILTER PLMT / S&I /IMG GUID/MOD SED  07/21/2019   IR RADIOLOGIST EVAL & MGMT  12/14/2019   IR RADIOLOGIST EVAL & MGMT  12/21/2020   IR VENOGRAM RENAL UNI RIGHT  07/21/2019   MASS EXCISION N/A 07/21/2017   Procedure: EXCISION OF BENIGN NECK LESION WITH LAYERED CLOSURE;  Surgeon: Glenna Fellows, MD;  Location: [facility];  Service: Plastics;  Laterality: N/A;   MASS EXCISION N/A 11/10/2017   Procedure: EXCISION BENIGN LESION OF THE NECK WITH LAYERED CLOSURE;  Surgeon: Glenna Fellows, MD;  Location: [facility];  Service: Plastics;  Laterality: N/A;    Prior to Admission medications   Medication Sig Start Date End Date Taking? Authorizing Provider  ASPIRIN LOW DOSE 81 MG tablet TAKE 1 TABLET (81 MG TOTAL) BY MOUTH DAILY. SWALLOW WHOLE. 04/16/23  Yes Saguier, Ramon Dredge, PA-C  atorvastatin (LIPITOR) 10 MG tablet TAKE 1 TABLET BY MOUTH EVERY DAY 04/16/23  Yes Saguier, Ramon Dredge, PA-C  baclofen (LIORESAL) 10 MG tablet Take 1 tablet (10 mg total) by mouth 3 (three) times daily. 08/11/22  Yes Raulkar, Drema Pry, MD  metoprolol tartrate (LOPRESSOR) 50 MG tablet Take 1 tablet (50 mg total) by mouth 2 (two) times daily. 10/22/22  Yes Raulkar, Drema Pry, MD  tiZANidine (ZANAFLEX) 4 MG tablet Take 1 tablet (4 mg total) by mouth at bedtime as needed for muscle spasms. 05/22/22   Raulkar,  Drema Pry, MD    Current Outpatient Medications  Medication Sig Dispense Refill   ASPIRIN LOW DOSE 81 MG tablet TAKE 1 TABLET (81 MG TOTAL) BY MOUTH DAILY. SWALLOW WHOLE. 90 tablet 1   atorvastatin (LIPITOR) 10 MG tablet TAKE 1 TABLET BY MOUTH EVERY DAY 30 tablet 5   baclofen (LIORESAL) 10 MG tablet Take 1 tablet (10 mg total) by mouth 3 (three) times daily. 270 tablet 1   metoprolol tartrate (LOPRESSOR) 50 MG tablet Take 1 tablet (50 mg total) by mouth 2 (two) times daily. 180 tablet 3   tiZANidine  (ZANAFLEX) 4 MG tablet Take 1 tablet (4 mg total) by mouth at bedtime as needed for muscle spasms. 90 tablet 3   Current Facility-Administered Medications  Medication Dose Route Frequency Provider Last Rate Last Admin   0.9 %  sodium chloride infusion  500 mL Intravenous Once Iva Boop, MD        Allergies as of 05/20/2023 - Review Complete 05/20/2023  Allergen Reaction Noted   Hydrocodone Nausea Only 12/19/2015    Family History  Problem Relation Age of Onset   Healthy Mother    Healthy Father    Clotting disorder Maternal Aunt    Clotting disorder Maternal Uncle    Clotting disorder Maternal Grandmother    Diabetes Paternal Grandfather    Colon cancer Neg Hx    Rectal cancer Neg Hx    Stomach cancer Neg Hx     Social History   Socioeconomic History   Marital status: Married    Spouse name: Vangie Bicker   Number of children: 2   Years of education: Not on file   Highest education level: Not on file  Occupational History   Occupation: unemployed 01/30/21  Tobacco Use   Smoking status: Former    Packs/day: 0    Types: Cigarettes    Quit date: 11/24/2015    Years since quitting: 7.4   Smokeless tobacco: Never   Tobacco comments:       Vaping Use   Vaping Use: Never used  Substance and Sexual Activity   Alcohol use: Yes    Comment: socially   Drug use: Never   Sexual activity: Not Currently  Other Topics Concern   Not on file  Social History Narrative   Lives with wife and kids   Right Handed   Drinks >10 cups caffeine daily   Social Determinants of Health   Financial Resource Strain: Not on file  Food Insecurity: Not on file  Transportation Needs: Not on file  Physical Activity: Not on file  Stress: Not on file  Social Connections: Not on file  Intimate Partner Violence: Not on file    Review of Systems:  All other review of systems negative except as mentioned in the HPI.  Physical Exam: Vital signs BP 133/86   Pulse 74   Temp 98 F (36.7 C)    Ht 6' (1.829 m)   Wt 285 lb (129.3 kg)   SpO2 96%   BMI 38.65 kg/m   General:   Alert,  Well-developed, well-nourished, pleasant and cooperative in NAD Lungs:  Clear throughout to auscultation.   Heart:  Regular rate and rhythm; no murmurs, clicks, rubs,  or gallops. Abdomen:  Soft, nontender and nondistended. Normal bowel sounds.   Neuro/Psych:  Alert and cooperative. Normal mood and affect. A and O x 3   @Kaye Mitro  Sena Slate, MD, [facility] [phone] (pager) 05/20/2023 3:18 PM@

## 2023-05-20 NOTE — Progress Notes (Unsigned)
Report given to PACU, vss 

## 2023-05-20 NOTE — Progress Notes (Unsigned)
1540 Ephedrine 10 mg given IV due to low BP, MD updated.  

## 2023-05-20 NOTE — Op Note (Signed)
[facility] Patient Name: Matthew Black Procedure Date: 05/20/2023 2:56 PM MRN: [id number] Endoscopist: Iva Boop , MD, [id number] Age: 47 Referring MD:  Date of Birth: Nov 18, 1976 Gender: Male Account #: [id number] Procedure:                Colonoscopy Indications:              Screening for colorectal malignant neoplasm, This                            is the patient's first colonoscopy Medicines:                Monitored Anesthesia Care Procedure:                Pre-Anesthesia Assessment:                           - Prior to the procedure, a History and Physical                            was performed, and patient medications and                            allergies were reviewed. The patient's tolerance of                            previous anesthesia was also reviewed. The risks                            and benefits of the procedure and the sedation                            options and risks were discussed with the patient.                            All questions were answered, and informed consent                            was obtained. Prior Anticoagulants: The patient has                            taken no anticoagulant or antiplatelet agents. ASA                            Grade Assessment: III - A patient with severe                            systemic disease. After reviewing the risks and                            benefits, the patient was deemed in satisfactory                            condition to undergo the procedure.  After obtaining informed consent, the colonoscope                            was passed under direct vision. Throughout the                            procedure, the patient's blood pressure, pulse, and                            oxygen saturations were monitored continuously. The                            CF [id number] #[id number] was introduced through the anus                            and advanced to the  the cecum, identified by                            appendiceal orifice and ileocecal valve. The                            colonoscopy was performed without difficulty. The                            patient tolerated the procedure well. The quality                            of the bowel preparation was good. The ileocecal                            valve, appendiceal orifice, and rectum were                            photographed. The bowel preparation used was                            Miralax via split dose instruction. Scope In: 3:23:59 PM Scope Out: 3:40:48 PM Scope Withdrawal Time: 0 hours 12 minutes 54 seconds  Total Procedure Duration: 0 hours 16 minutes 49 seconds  Findings:                 The perianal and digital rectal examinations were                            normal. Pertinent negatives include normal prostate                            (size, shape, and consistency).                           Four sessile and semi-pedunculated polyps were                            found in the rectum, descending colon and  transverse colon. The polyps were diminutive in                            size. These polyps were removed with a cold snare.                            Resection and retrieval were complete. Verification                            of patient identification for the specimen was                            done. Estimated blood loss was minimal.                           The exam was otherwise without abnormality on                            direct and retroflexion views. Complications:            No immediate complications. Estimated Blood Loss:     Estimated blood loss was minimal. Impression:               - Four diminutive polyps in the rectum, in the                            descending colon and in the transverse colon,                            removed with a cold snare. Resected and retrieved.                           - The  examination was otherwise normal on direct                            and retroflexion views. Recommendation:           - Patient has a contact number available for                            emergencies. The signs and symptoms of potential                            delayed complications were discussed with the                            patient. Return to normal activities tomorrow.                            Written discharge instructions were provided to the                            patient.                           -  Resume previous diet.                           - Continue present medications.                           - Await pathology results.                           - Repeat colonoscopy is recommended. The                            colonoscopy date will be determined after pathology                            results from today's exam become available for                            review. Iva Boop, MD 05/20/2023 3:51:53 PM This report has been signed electronically.

## 2023-05-20 NOTE — Progress Notes (Signed)
Need assistance when dressed.

## 2023-05-21 ENCOUNTER — Telehealth: Payer: Self-pay | Admitting: *Deleted

## 2023-05-21 NOTE — Telephone Encounter (Signed)
Post procedure follow up phone call. No answer at number given.  Left message on voicemail.  

## 2023-05-27 ENCOUNTER — Ambulatory Visit: Payer: Medicare Other | Attending: Medical | Admitting: Occupational Therapy

## 2023-05-27 DIAGNOSIS — M25512 Pain in left shoulder: Secondary | ICD-10-CM | POA: Insufficient documentation

## 2023-05-27 DIAGNOSIS — G8929 Other chronic pain: Secondary | ICD-10-CM | POA: Diagnosis present

## 2023-05-27 DIAGNOSIS — I69354 Hemiplegia and hemiparesis following cerebral infarction affecting left non-dominant side: Secondary | ICD-10-CM | POA: Diagnosis present

## 2023-05-27 DIAGNOSIS — M24542 Contracture, left hand: Secondary | ICD-10-CM | POA: Diagnosis present

## 2023-05-27 DIAGNOSIS — M6281 Muscle weakness (generalized): Secondary | ICD-10-CM | POA: Diagnosis present

## 2023-05-27 NOTE — Therapy (Unsigned)
OUTPATIENT OCCUPATIONAL THERAPY NEURO TREATMENT  Patient Name: Matthew Black MRN: [id number] DOB:[birth year], 47 y.o., male Today's Date: 05/27/2023  PCP: Esperanza Richters, PA-C REFERRING PROVIDER: Horton Chin, MD  END OF SESSION:  OT End of Session - 05/27/23 1411     Visit Number 3    Number of Visits 7    Date for OT Re-Evaluation 06/19/23    Authorization Type Medicare A & B  20% coinsurance    Progress Note Due on Visit 10    OT Start Time 1410    OT Stop Time 1455    OT Time Calculation (min) 45 min    Equipment Utilized During Treatment New Splint    Activity Tolerance Patient tolerated treatment well    Behavior During Therapy [facility] for tasks assessed/performed             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   Stroke (HCC)    RICA, R A1, R MCA occlusion 07/19/19   Past Surgical History:  Procedure Laterality Date   CRANIOPLASTY Right 06/05/2020   Procedure: CRANIOPLASTY;  Surgeon: Lisbeth Renshaw, MD;  Location: [facility] OR;  Service: Neurosurgery;  Laterality: Right;  right   CRANIOTOMY Right 07/19/2019   Procedure: RIGHT HEMI-CRANIECTOMY With implantation of skull flap to abdominal wall;  Surgeon: Lisbeth Renshaw, MD;  Location: [facility] OR;  Service: Neurosurgery;  Laterality: Right;   CYST EXCISION N/A 10/08/2016   Procedure: EXCISION OF POSTERIOR NECK CYST;  Surgeon: Berna Bue, MD;  Location: [facility] ORS;  Service: General;  Laterality: N/A;   CYSTOSCOPY/URETEROSCOPY/HOLMIUM LASER/STENT PLACEMENT Right 03/16/2020   Procedure: CYSTOSCOPY RIGHT RETROGRADE PYELOGRAM URETEROSCOPY/HOLMIUM LASER/STENT  PLACEMENT;  Surgeon: Crista Elliot, MD;  Location: [facility] ORS;  Service: Urology;  Laterality: Right;   INCISION AND DRAINAGE ABSCESS N/A 09/22/2014   Procedure: INCISION AND DRAINAGE ABSCESS POSTERIOR NECK;  Surgeon: Valarie Merino, MD;  Location: [facility] ORS;  Service: General;  Laterality: N/A;   INCISION AND DRAINAGE ABSCESS N/A 12/20/2015   Procedure: INCISION AND DRAINAGE POSTERIOR NECK MASS;  Surgeon: Darnell Level, MD;  Location: [facility] ORS;  Service: General;  Laterality: N/A;   INCISION AND DRAINAGE ABSCESS Left 07/10/2004   middle finger   IR IVC FILTER PLMT / S&I /IMG GUID/MOD SED  07/21/2019   IR RADIOLOGIST EVAL & MGMT  12/14/2019   IR RADIOLOGIST EVAL & MGMT  12/21/2020   IR VENOGRAM RENAL UNI RIGHT  07/21/2019   MASS EXCISION N/A 07/21/2017   Procedure: EXCISION OF BENIGN NECK LESION WITH LAYERED CLOSURE;  Surgeon: Glenna Fellows, MD;  Location: [facility];  Service: Plastics;  Laterality: N/A;   MASS EXCISION N/A 11/10/2017   Procedure: EXCISION BENIGN LESION OF THE NECK WITH LAYERED CLOSURE;  Surgeon: Glenna Fellows, MD;  Location: [facility];  Service: Plastics;  Laterality: N/A;   Patient Active Problem List   Diagnosis Date Noted   COVID-19 virus infection 04/21/2021   Left-sided weakness 04/21/2021   S/P craniotomy 06/05/2020   History of cranioplasty 06/05/2020   Peri-rectal abscess 02/14/2020  Abnormal CT scan, pelvis 02/14/2020   Pancytopenia (HCC) 12/02/2019   Nephrolithiasis 12/02/2019   Hydronephrosis with renal and ureteral calculus obstruction 12/02/2019   Rectal pain 12/02/2019   Hyperkalemia 12/02/2019   Reactive depression    Wound infection after surgery    Sleep disturbance    Dysphagia, post-stroke    Transaminitis    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   Cerebral abscess    Urinary tract infection without hematuria    Altered mental status    Primary hypercoagulable state (HCC)    Acute pulmonary  embolism without acute cor pulmonale (HCC)    Deep vein thrombosis (DVT) of non-extremity vein    Hypokalemia    Acute blood loss anemia    Leukocytosis    Endotracheal tube present    Acute respiratory failure with hypoxemia (HCC)    Stroke (cerebrum) (HCC) 07/19/2019   Pressure injury of skin 07/19/2019   Acute CVA (cerebrovascular accident) (HCC)    Encephalopathy    Dysphagia    Acute encephalopathy    Essential hypertension    Obesity 03/12/2016   Scalp abscess 12/20/2015   Neck abscess 12/20/2015   Pilonidal cyst 02/08/2013    ONSET DATE: 05/01/2023  REFERRING DIAG: I63.9 (ICD-10-CM) - Cerebrovascular accident (CVA), 2020  THERAPY DIAG:  Contracture, left hand  Spastic hemiplegia of left nondominant side as late effect of cerebral infarction [facility])  Rationale for Evaluation and Treatment: Rehabilitation  SUBJECTIVE:   SUBJECTIVE STATEMENT: Patient was provided information about obtaining a prefabricated splint through orthotist and copayment of $52 with patient agreeing to purchase.    Pt accompanied by: self  PERTINENT HISTORY: Patient called the doctor's office stating he needs a new brace for his left hand. He said that therapy gave him his last one about 2 or 3 years ago.   Presented 07/19/2019 with L sided weakness with diagnosis of Right middle cerebral artery stroke.  Patient underwent decompressive right hemicraniectomy with abdominal flap implant 07/19/2019 Inpatient Rehab: 09/06/2019 - 10/07/2019  Multiple outpatient therapy sessions with most recent being aquatic therapy at the beginning of 2024.  PRECAUTIONS: Fall  WEIGHT BEARING RESTRICTIONS: No  PAIN:  Are you having pain? Patient reports no pain at rest but yes to pain with mobility. No and Yes: NPRS scale: 5/10 Pain location: L shoulder Pain description: Sharp Aggravating factors: arm dangling Relieving factors: resting it  FALLS: Has patient fallen in last 6 months? No  LIVING  ENVIRONMENT:   Lives with: lives with spouse & children (3 yo daughter/14 yo son)  Patient has nurse that helps with bathing M-F x 3 hours/day.  Shower is upstairs so he generally sponge bathes.  Lives in: House Stairs: yes 3 steps to enter with rail on the R side entering; full flight of stairs (17 steps) indoors to get upstairs to shower but no longer goes up there s/p previous fall nearly 2 years go Has following equipment at home: Wheelchair (manual) and hospital bed; BSC for bathing at bedside but goes to the bathroom to toilet; quad cane  PLOF: Independent with basic ADLs, Independent with household mobility without device, Independent with transfers, Needs assistance with ADLs, and Needs assistance with homemaking Patient spends more time in [facility] at home due to moving around quicker but does walk around where/when he can daily with his quad cane  PATIENT GOALS: Replace/repair hand splint.  Put on his L shoe (with AFO).  OBJECTIVE:   HAND DOMINANCE: Right  ADLs: Overall ADLs: Needs  assistance with LB ADLs and has a 'nurse' 5x/week x 3 hours Transfers/ambulation related to ADLs: MI with quad cane, uses WC to get around quicker at home Eating: Spouse or nurse cuts his food  Grooming: Patient shaves himself with clippers ( but noted to have missed the back of his head at OT eval today) UB Dressing: Shirt sometimes gets caught on his shoulder but generally can manage a t shirt himself LB Dressing: Can do underwear and pants & R sock/shoe but can't do L sock or shoe Toileting: MI with quad cane Bathing: Can bathe UB and legs but can't wash his feet and R arm; has brush to clean his back, sits on BSC at bedside to wash his bottom  Tub Shower transfers: NA Equipment: bed side commode, Reacher, and Long handled sponge  IADLs: Shopping: Spouse Light housekeeping: Spouse/Nurse Meal Prep: Did not ask Community mobility: Drove himself to therapy today Medication management: Did not  ask Financial management: Did not ask Handwriting: 100% legible - R handed  MOBILITY STATUS: Needs Assist: unable to ambulate quickly for toielitng etc, Hx of falls, difficulty with turns, difficulty carrying objections with ambulation, and quad cane  POSTURE COMMENTS:  weight shift right Sitting balance:  WFL  ACTIVITY TOLERANCE: Activity tolerance: Seated good, standing/gait limited  FUNCTIONAL OUTCOME MEASURES: TBD  UPPER EXTREMITY ROM:    Active ROM Right eval Left eval  Shoulder flexion WFL AROM poor except for hand grip.  PROM Good elbow and distal but limited with L shoulder due to subluxation and discomfort.  Shoulder abduction    Shoulder adduction    Shoulder extension    Shoulder internal rotation    Shoulder external rotation    Elbow flexion    Elbow extension    Wrist flexion    Wrist extension    Wrist ulnar deviation    Wrist radial deviation    Wrist pronation    Wrist supination    (Blank rows = not tested)  UPPER EXTREMITY MMT:     MMT Right eval Left eval  Shoulder flexion 5/5 0-1/5 - minimal flickers of muscle movement noted except for digits ie) able to squeeze functionally but can not open digits.   Digital flexion 3/5  Shoulder abduction    Shoulder adduction    Shoulder extension    Shoulder internal rotation    Shoulder external rotation    Middle trapezius    Lower trapezius    Elbow flexion    Elbow extension    Wrist flexion    Wrist extension    Wrist ulnar deviation    Wrist radial deviation    Wrist pronation    Wrist supination    (Blank rows = not tested)  HAND FUNCTION: R UE WFL; L UE Poor - can grasp item placed in hand but can not let go and can not move limb to pick up an item  COORDINATION: R UE WFL; L UE NA/poor  SENSATION: R UE WFL; L UE impaired  EDEMA: R UE NA; L UE slight  MUSCLE TONE: LUE: Moderate, Flaccid, and Shoulder flaccid with significant shoulder subluxation > 2 fingers widths and hand gets  fisted and is difficulty to open ie) at night and then his hands hurts in the morning  COGNITION: Overall cognitive status: Impaired - Extra time needed to follow directions and answer questions at times.  VISION ASSESSMENT: Not tested  PERCEPTION: NT  PRAXIS: NT  Evaluation OBSERVATIONS: Patient is a tall black male with  significant left hemiplegia with flaccid shoulder who arrived today with a custom thermoplastic resting hand splint on his left hand which was sliding off and noted to have poor straps to hold it in place.  Patient demonstrates good passive range of motion of digital extension however has no active extension of digits with home resulting and fisted hand often for patient throughout the day/night.  Patient ambulates slowly and has discomfort and left shoulder which hangs due to flaccid hemiplegia at shoulder.  He has at least a 2 finger widths shoulder subluxation but can perform digital flexion to fist his hand.   TODAY'S TREATMENT:                                                                                                                              DATE: 05/13/23    Self care - Majority of session is spent focusing on ability to don his own shoe.  Patient is not able to pick his foot up high enough to put it inside his shoe from seated position in a chair.  Attempts at using the foot funnel were also unsuccessful as he could not push his foot into the shoe.  Patient demonstrated his attempts at standing to lift his foot and put it in his shoe but still could not get his foot in all the way.  Even crossing his L foot over his R ankle, he was not able to get his shoe over his foot.    Finally patient was engaged in working from seated position on a hospital bed which he has at home.  He was encouraged to scoot back on the bed to allow his left leg to be pulled up into external rotation with is foot hanging off the edge of the bed slightly.  This position allowed him to  access to his foot and this the laces opened wide enough, he is able to put the shoe over his foot and AFO.  Then he worked at pulling the sole of the shoe back until her could grab the loop at the back of the shoe to pull it behind his heel.  Patient was able to perform this task 2 out of 4 times without physical assistance from therapist after minimal physical assistance the first 2 attempts.  Therapist did switch his shoelaces for curly elastic shoelaces which he is able to pull tight enough to be comfortable and keep shoe in place.  In addition therapist worked on fastening the ankle strap of his AFO to increase ease with donning shoe also.  Orthotic Management - Patient is provided information about co-pay in order to order prefabricated resting hand splint, with patient in agreement.  Information is shared with orthotist to order splint and patient's next appointment is shared for delivery.   PATIENT EDUCATION: Education details: ADL comp strategy for donning shoe Person educated: Patient Education method: Explanation, Demonstration, Tactile cues, and Verbal cues Education comprehension: verbalized understanding, returned  demonstration, verbal cues required, tactile cues required, and needs further education  HOME EXERCISE PROGRAM: TBD   GOALS: Goals reviewed with patient? Yes   LONG TERM GOALS: Target date: 06/19/23  Patient will have appropriate resting hand splint and/or roll for alternating wear schedule for L UE with good fit, stretch, comfort and positioning of L digits and wrist. Baseline: Thermoplastic splint in ill-repair.  Had previous pre-fab splint > 2-3 years ago but unable to use it comfortably.  Goal status: IN PROGRESS  2.  Patient will be independent with directing caregivers to apply splint and care for splint - appropriately cleaning and removing/donning cover to splint. Baseline: MI with current thermoplastic splint. Goal status: INITIAL  3.  Patient will be  independent with HEP for L UE hemiplegia to work on shoulder approximation and comfort. Baseline: 2 finger width shoulder subluxation and pain with ambulation. Goal status: INITIAL  4.  Patient will have information re: hemi-sling options to help with shoulder positioning for improved comfort with gait.  Baseline: Previously used sling but it pulled on his neck. Goal status: INITIAL  5.  Patient will try modified techniques, AE and positions to help with L LE dressing including sock and shoe (with AFO).  Baseline: Dependent on caregiver/spouse. Goal status: IN PROGRESS  6.  Patient will be aware of options for AE as needed for L hemiplegia based on areas identified as problems. Baseline: In need of new reacher. Goal status: INITIAL  ASSESSMENT:  CLINICAL IMPRESSION: Patient is seen today for occupational therapy treatment today with some good success at teaching him how to don his L shoe on his own from seated position at EOB with L leg pulled up on the bed.  Patient was particularly pleased with himself for being able to put his shoe on without physical assistance a couple of times today and agrees to practice the process at home this week.  Therapist also shared information about ordering a prefabricated resting hand splint as patient will have a co-pay.  Patient is in agreement and orthotist is provided information to order splint.  Patient will benefit from continued skilled occupational therapy services to address splinting needs and assist with possible hemisling, techniques for lower body dressing and recommendations for adaptive equipment considerations.  PERFORMANCE DEFICITS: in functional skills including ADLs, IADLs, coordination, dexterity, sensation, tone, ROM, strength, pain, muscle spasms, flexibility, Fine motor control, Gross motor control, skin integrity, and UE functional use, cognitive skills including learn and understand, and psychosocial skills including routines and  behaviors.   IMPAIRMENTS: are limiting patient from ADLs, rest and sleep, leisure, and social participation.   CO-MORBIDITIES: may have co-morbidities  that affects occupational performance. Patient will benefit from skilled OT to address above impairments and improve overall function.  REHAB POTENTIAL: Good   PLAN:  OT FREQUENCY: 1x/week  OT DURATION: 6 weeks  PLANNED INTERVENTIONS: self care/ADL training, therapeutic exercise, therapeutic activity, neuromuscular re-education, manual therapy, passive range of motion, splinting, patient/family education, coping strategies training, and DME and/or AE instructions  RECOMMENDED OTHER SERVICES: NA at this time  CONSULTED AND AGREED WITH PLAN OF CARE: Patient  PLAN FOR NEXT SESSION: Fit prefabricated resting hand splint as available and explore hemi Sling options.  Review ADL comp strategies  to put his shoe on with AFO ie) use of hospital bed   HEP ideas for L UE positioning and AAROM.   Victorino Sparrow, OT 05/27/2023, 3:11 PM

## 2023-06-01 ENCOUNTER — Ambulatory Visit: Payer: Medicare Other | Admitting: Occupational Therapy

## 2023-06-01 ENCOUNTER — Encounter: Payer: Self-pay | Admitting: Internal Medicine

## 2023-06-01 DIAGNOSIS — Z860101 Personal history of adenomatous and serrated colon polyps: Secondary | ICD-10-CM

## 2023-06-01 DIAGNOSIS — M6281 Muscle weakness (generalized): Secondary | ICD-10-CM

## 2023-06-01 DIAGNOSIS — M24542 Contracture, left hand: Secondary | ICD-10-CM | POA: Diagnosis not present

## 2023-06-01 DIAGNOSIS — G8929 Other chronic pain: Secondary | ICD-10-CM

## 2023-06-01 DIAGNOSIS — Z86010 Personal history of colonic polyps: Secondary | ICD-10-CM | POA: Insufficient documentation

## 2023-06-01 DIAGNOSIS — I69354 Hemiplegia and hemiparesis following cerebral infarction affecting left non-dominant side: Secondary | ICD-10-CM

## 2023-06-01 HISTORY — DX: Personal history of adenomatous and serrated colon polyps: Z86.0101

## 2023-06-01 NOTE — Therapy (Signed)
OUTPATIENT OCCUPATIONAL THERAPY NEURO TREATMENT  Patient Name: Matthew Black MRN: [id number] DOB:[birth year], 47 y.o., male Today's Date: 06/01/2023  PCP: Esperanza Richters, PA-C REFERRING PROVIDER: Horton Chin, MD  END OF SESSION:  OT End of Session - 06/01/23 1401     Visit Number 4    Number of Visits 7    Date for OT Re-Evaluation 06/19/23    Authorization Type Medicare A & B  20% coinsurance    Progress Note Due on Visit 10    OT Start Time 1402    OT Stop Time 1444    OT Time Calculation (min) 42 min    Equipment Utilized During Treatment New Splint    Activity Tolerance Patient tolerated treatment well    Behavior During Therapy [facility] for tasks assessed/performed             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   Stroke (HCC)    RICA, R A1, R MCA occlusion 07/19/19   Past Surgical History:  Procedure Laterality Date   CRANIOPLASTY Right 06/05/2020   Procedure: CRANIOPLASTY;  Surgeon: Lisbeth Renshaw, MD;  Location: [facility] OR;  Service: Neurosurgery;  Laterality: Right;  right   CRANIOTOMY Right 07/19/2019   Procedure: RIGHT HEMI-CRANIECTOMY With implantation of skull flap to abdominal wall;  Surgeon: Lisbeth Renshaw, MD;  Location: [facility] OR;  Service: Neurosurgery;  Laterality: Right;   CYST EXCISION N/A 10/08/2016   Procedure: EXCISION OF POSTERIOR NECK CYST;  Surgeon: Berna Bue, MD;  Location: [facility] ORS;  Service: General;  Laterality: N/A;   CYSTOSCOPY/URETEROSCOPY/HOLMIUM LASER/STENT PLACEMENT Right 03/16/2020   Procedure: CYSTOSCOPY RIGHT RETROGRADE  PYELOGRAM URETEROSCOPY/HOLMIUM LASER/STENT PLACEMENT;  Surgeon: Crista Elliot, MD;  Location: [facility];  Service: Urology;  Laterality: Right;   INCISION AND DRAINAGE ABSCESS N/A 09/22/2014   Procedure: INCISION AND DRAINAGE ABSCESS POSTERIOR NECK;  Surgeon: Valarie Merino, MD;  Location: [facility];  Service: General;  Laterality: N/A;   INCISION AND DRAINAGE ABSCESS N/A 12/20/2015   Procedure: INCISION AND DRAINAGE POSTERIOR NECK MASS;  Surgeon: Darnell Level, MD;  Location: [facility];  Service: General;  Laterality: N/A;   INCISION AND DRAINAGE ABSCESS Left 07/10/2004   middle finger   IR IVC FILTER PLMT / S&I /IMG GUID/MOD SED  07/21/2019   IR RADIOLOGIST EVAL & MGMT  12/14/2019   IR RADIOLOGIST EVAL & MGMT  12/21/2020   IR VENOGRAM RENAL UNI RIGHT  07/21/2019   MASS EXCISION N/A 07/21/2017   Procedure: EXCISION OF BENIGN NECK LESION WITH LAYERED CLOSURE;  Surgeon: Glenna Fellows, MD;  Location: [facility];  Service: Plastics;  Laterality: N/A;   MASS EXCISION N/A 11/10/2017   Procedure: EXCISION BENIGN LESION OF THE NECK WITH LAYERED CLOSURE;  Surgeon: Glenna Fellows, MD;  Location: [facility];  Service: Plastics;  Laterality: N/A;   Patient Active Problem List   Diagnosis Date Noted   Hx of adenomatous colonic polyps 06/01/2023   COVID-19 virus infection 04/21/2021   Left-sided weakness 04/21/2021  S/P craniotomy 06/05/2020   History of cranioplasty 06/05/2020   Peri-rectal abscess 02/14/2020   Abnormal CT scan, pelvis 02/14/2020   Pancytopenia (HCC) 12/02/2019   Nephrolithiasis 12/02/2019   Hydronephrosis with renal and ureteral calculus obstruction 12/02/2019   Rectal pain 12/02/2019   Hyperkalemia 12/02/2019   Reactive depression    Wound infection after surgery    Sleep disturbance    Dysphagia, post-stroke    Transaminitis    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   Cerebral abscess    Urinary tract infection without hematuria     Altered mental status    Primary hypercoagulable state (HCC)    Acute pulmonary embolism without acute cor pulmonale (HCC)    Deep vein thrombosis (DVT) of non-extremity vein    Hypokalemia    Acute blood loss anemia    Leukocytosis    Endotracheal tube present    Acute respiratory failure with hypoxemia (HCC)    Stroke (cerebrum) (HCC) 07/19/2019   Pressure injury of skin 07/19/2019   Acute CVA (cerebrovascular accident) (HCC)    Encephalopathy    Dysphagia    Acute encephalopathy    Essential hypertension    Obesity 03/12/2016   Scalp abscess 12/20/2015   Neck abscess 12/20/2015   Pilonidal cyst 02/08/2013    ONSET DATE: 05/01/2023  REFERRING DIAG: I63.9 (ICD-10-CM) - Cerebrovascular accident (CVA), 2020  THERAPY DIAG:  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  During conversation about therapy sessions, patient reported wanting to get back in the pool as he could move much easier and that he slept much better after pool therapy  Patient reports that the Sketcher shoe he wanted to try last week did not fit him well enough ie) still too narrow.  Pt accompanied by: self  PERTINENT HISTORY: Patient called the doctor's office stating he needs a new brace for his left hand. He said that therapy gave him his last one about 2 or 3 years ago.   Presented 07/19/2019 with L sided weakness with diagnosis of Right middle cerebral artery stroke.  Patient underwent decompressive right hemicraniectomy with abdominal flap implant 07/19/2019 Inpatient Rehab: 09/06/2019 - 10/07/2019  Multiple outpatient therapy sessions with most recent being aquatic therapy at the beginning of  2024.  PRECAUTIONS: Fall  WEIGHT BEARING RESTRICTIONS: No  PAIN:  Are you having pain? Patient reports no pain at rest but yes to pain with mobility. No and Yes: NPRS scale: 5/10 Pain location: L shoulder Pain description: Sharp Aggravating factors: arm dangling Relieving factors: resting it  FALLS: Has patient fallen in last 6 months? No  LIVING ENVIRONMENT:   Lives with: lives with spouse & children (7 yo daughter/45 yo son)  Patient has nurse that helps with bathing M-F x 3 hours/day.  Shower is upstairs so he generally sponge bathes.  Lives in: House Stairs: yes 3 steps to enter with rail on the R side entering; full flight of stairs (17 steps) indoors to get upstairs to shower but no longer goes up there s/p previous fall nearly 2 years go Has following equipment at home: Wheelchair (manual) and hospital bed; BSC for bathing at bedside but goes to the bathroom to toilet; quad cane  PLOF: Independent with  basic ADLs, Independent with household mobility without device, Independent with transfers, Needs assistance with ADLs, and Needs assistance with homemaking Patient spends more time in [facility] at home due to moving around quicker but does walk around where/when he can daily with his quad cane  PATIENT GOALS: Replace/repair hand splint.  Put on his L shoe (with AFO).  OBJECTIVE:   HAND DOMINANCE: Right  ADLs: Overall ADLs: Needs assistance with LB ADLs and has a 'nurse' 5x/week x 3 hours Transfers/ambulation related to ADLs: MI with quad cane, uses WC to get around quicker at home Eating: Spouse or nurse cuts his food  Grooming: Patient shaves himself with clippers ( but noted to have missed the back of his head at OT eval today) UB Dressing: Shirt sometimes gets caught on his shoulder but generally can manage a t shirt himself LB Dressing: Can do underwear and pants & R sock/shoe but can't do L sock or shoe Toileting: MI with quad cane Bathing: Can bathe UB and legs but can't  wash his feet and R arm; has brush to clean his back, sits on BSC at bedside to wash his bottom  Tub Shower transfers: NA Equipment: bed side commode, Reacher, and Long handled sponge  IADLs: Shopping: Spouse Light housekeeping: Spouse/Nurse Meal Prep: Did not ask Community mobility: Drove himself to therapy today Medication management: Did not ask Financial management: Did not ask Handwriting: 100% legible - R handed  MOBILITY STATUS: Needs Assist: unable to ambulate quickly for toielitng etc, Hx of falls, difficulty with turns, difficulty carrying objections with ambulation, and quad cane  POSTURE COMMENTS:  weight shift right Sitting balance:  WFL  ACTIVITY TOLERANCE: Activity tolerance: Seated good, standing/gait limited  FUNCTIONAL OUTCOME MEASURES: TBD  UPPER EXTREMITY ROM:    Active ROM Right eval Left eval  Shoulder flexion WFL AROM poor except for hand grip.  PROM Good elbow and distal but limited with L shoulder due to subluxation and discomfort.  Shoulder abduction    Shoulder adduction    Shoulder extension    Shoulder internal rotation    Shoulder external rotation    Elbow flexion    Elbow extension    Wrist flexion    Wrist extension    Wrist ulnar deviation    Wrist radial deviation    Wrist pronation    Wrist supination    (Blank rows = not tested)  UPPER EXTREMITY MMT:     MMT Right eval Left eval  Shoulder flexion 5/5 0-1/5 - minimal flickers of muscle movement noted except for digits ie) able to squeeze functionally but can not open digits.   Digital flexion 3/5  Shoulder abduction    Shoulder adduction    Shoulder extension    Shoulder internal rotation    Shoulder external rotation    Middle trapezius    Lower trapezius    Elbow flexion    Elbow extension    Wrist flexion    Wrist extension    Wrist ulnar deviation    Wrist radial deviation    Wrist pronation    Wrist supination    (Blank rows = not tested)  HAND  FUNCTION: R UE WFL; L UE Poor - can grasp item placed in hand but can not let go and can not move limb to pick up an item  COORDINATION: R UE WFL; L UE NA/poor  SENSATION: R UE WFL; L UE impaired  EDEMA: R UE NA; L UE slight  MUSCLE TONE: LUE: Moderate,  Flaccid, and Shoulder flaccid with significant shoulder subluxation > 2 fingers widths and hand gets fisted and is difficulty to open ie) at night and then his hands hurts in the morning  COGNITION: Overall cognitive status: Impaired - Extra time needed to follow directions and answer questions at times.  VISION ASSESSMENT: Not tested  PERCEPTION: NT  PRAXIS: NT  Evaluation OBSERVATIONS: Patient is a tall black male with significant left hemiplegia with flaccid shoulder who arrived today with a custom thermoplastic resting hand splint on his left hand which was sliding off and noted to have poor straps to hold it in place.  Patient demonstrates good passive range of motion of digital extension however has no active extension of digits with home resulting and fisted hand often for patient throughout the day/night.  Patient ambulates slowly and has discomfort and left shoulder which hangs due to flaccid hemiplegia at shoulder.  He has at least a 2 finger widths shoulder subluxation but can perform digital flexion to fist his hand.   TODAY'S TREATMENT:                                                                                                                              DATE: 06/01/23   Neuromuscular Re-ed - Neuro re-education completed to help improve motor control of LUE s/p hemiplegia from CVA.  Pt engaged in performing L and BUE motions to improve motor control of LUE symmetrical posture and strengthening.  He is able to squeeze his hand upon verbal request but needs guidance to relax and stretch open digits.  He is able to pull the arm across the tabletop but has difficulty pushing his arm back away for his body ie) extending  elbow/digits. Patient requiring verbal, visual and tactile cues for neuromuscular re-education to ensure proper form/movement. He did better pulling his arm on a towel/washcloth to slide across the table top and also worked on moving dowel back and forth with both hands for carryover of R UE motions to engaged LUE.  He is encouraged to work on stabilizing shoulder throughout activities as well ie) using active shoulder shrug on L side to approximate shoulder joint during ROM. Pt education provided for how movements translate to ADLs. Pt receptive and accepting to hands-on guidance, visual feedback and verbal cues to improve symmetrical posture.   PATIENT EDUCATION: Education details: LUE ROM Person educated: Patient Education method: Explanation, Demonstration, Tactile cues, and Verbal cues Education comprehension: verbalized understanding, returned demonstration, verbal cues required, tactile cues required, and needs further education  HOME EXERCISE PROGRAM: TBD   GOALS: Goals reviewed with patient? Yes   LONG TERM GOALS: Target date: 06/19/23  Patient will have appropriate resting hand splint and/or roll for alternating wear schedule for L UE with good fit, stretch, comfort and positioning of L digits and wrist. Baseline: Thermoplastic splint in ill-repair.  Had previous pre-fab splint > 2-3 years ago but unable to use it comfortably.  Goal status:  MET  2.  Patient will be independent with directing caregivers to apply splint and care for splint - appropriately cleaning and removing/donning cover to splint. Baseline: MI with current thermoplastic splint. Goal status: MET  3.  Patient will be independent with HEP for L UE hemiplegia to work on shoulder approximation and comfort. Baseline: 2 finger width shoulder subluxation and pain with ambulation. Goal status: IN PROGRESS  4.  Patient will have information re: hemi-sling options to help with shoulder positioning for improved comfort  with gait.  Baseline: Previously used sling but it pulled on his neck. Goal status: IN PROGRESS  5.  Patient will try modified techniques, AE and positions to help with L LE dressing including sock and shoe (with AFO).  Baseline: Dependent on caregiver/spouse. Goal status: IN PROGRESS  6.  Patient will be aware of options for AE as needed for L hemiplegia based on areas identified as problems. Baseline: In need of new reacher. Goal status: IN PROGRESS  ASSESSMENT:  CLINICAL IMPRESSION: Patient is seen today for occupational therapy treatment today with focus on L UE AAROM.  He is able to pull the arm across the table and squeeze with hand but has difficulty opening hand or pushing arm back ie) extending elbow/digits.  Patient will benefit from continued skilled occupational therapy services to update handouts for HEP, provide education re: AE and hemisling options as well as techniques for lower body dressing and recommendations.  PERFORMANCE DEFICITS: in functional skills including ADLs, IADLs, coordination, dexterity, sensation, tone, ROM, strength, pain, muscle spasms, flexibility, Fine motor control, Gross motor control, skin integrity, and UE functional use, cognitive skills including learn and understand, and psychosocial skills including routines and behaviors.   IMPAIRMENTS: are limiting patient from ADLs, rest and sleep, leisure, and social participation.   CO-MORBIDITIES: may have co-morbidities  that affects occupational performance. Patient will benefit from skilled OT to address above impairments and improve overall function.  REHAB POTENTIAL: Good   PLAN:  OT FREQUENCY: 1x/week  OT DURATION: 6 weeks  PLANNED INTERVENTIONS: self care/ADL training, therapeutic exercise, therapeutic activity, neuromuscular re-education, manual therapy, passive range of motion, splinting, patient/family education, coping strategies training, and DME and/or AE instructions  RECOMMENDED  OTHER SERVICES: NA at this time  CONSULTED AND AGREED WITH PLAN OF CARE: Patient  PLAN FOR NEXT SESSION:   Provide education re: hemi Sling options again.  Review ADL comp strategies to put his shoe on with AFO ie) use of hospital bed   PRINT HEP ideas for L UE positioning and AAROM.  Check into Aquatic activity/therapy options.    Victorino Sparrow, OT 06/01/2023, 4:44 PM

## 2023-06-10 ENCOUNTER — Ambulatory Visit: Payer: Medicare Other | Admitting: Occupational Therapy

## 2023-06-10 DIAGNOSIS — M6281 Muscle weakness (generalized): Secondary | ICD-10-CM

## 2023-06-10 DIAGNOSIS — M24542 Contracture, left hand: Secondary | ICD-10-CM | POA: Diagnosis not present

## 2023-06-10 DIAGNOSIS — I69354 Hemiplegia and hemiparesis following cerebral infarction affecting left non-dominant side: Secondary | ICD-10-CM

## 2023-06-10 NOTE — Therapy (Signed)
OUTPATIENT OCCUPATIONAL THERAPY NEURO TREATMENT  Patient Name: Matthew Black MRN: [id number] DOB:Mar 06, 1976, 47 y.o., male Today's Date: 06/10/2023  PCP: Esperanza Richters, PA-C REFERRING PROVIDER: Horton Chin, MD  END OF SESSION:  OT End of Session - 06/10/23 1404     Visit Number 5    Number of Visits 7    Date for OT Re-Evaluation 06/19/23    Authorization Type Medicare A & B  20% coinsurance    Progress Note Due on Visit 10    OT Start Time 1405    OT Stop Time 1445    OT Time Calculation (min) 40 min    Equipment Utilized During Treatment --    Activity Tolerance Patient tolerated treatment well    Behavior During Therapy [facility] for tasks assessed/performed             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   Stroke (HCC)    RICA, R A1, R MCA occlusion 07/19/19   Past Surgical History:  Procedure Laterality Date   CRANIOPLASTY Right 06/05/2020   Procedure: CRANIOPLASTY;  Surgeon: Lisbeth Renshaw, MD;  Location: [facility] OR;  Service: Neurosurgery;  Laterality: Right;  right   CRANIOTOMY Right 07/19/2019   Procedure: RIGHT HEMI-CRANIECTOMY With implantation of skull flap to abdominal wall;  Surgeon: Lisbeth Renshaw, MD;  Location: [facility] OR;  Service: Neurosurgery;  Laterality: Right;   CYST EXCISION N/A 10/08/2016   Procedure: EXCISION OF POSTERIOR NECK CYST;  Surgeon: Berna Bue, MD;  Location: [facility] ORS;  Service: General;  Laterality: N/A;   CYSTOSCOPY/URETEROSCOPY/HOLMIUM LASER/STENT PLACEMENT Right 03/16/2020   Procedure: CYSTOSCOPY RIGHT RETROGRADE  PYELOGRAM URETEROSCOPY/HOLMIUM LASER/STENT PLACEMENT;  Surgeon: Crista Elliot, MD;  Location: [facility] ORS;  Service: Urology;  Laterality: Right;   INCISION AND DRAINAGE ABSCESS N/A 09/22/2014   Procedure: INCISION AND DRAINAGE ABSCESS POSTERIOR NECK;  Surgeon: Valarie Merino, MD;  Location: [facility]S;  Service: General;  Laterality: N/A;   INCISION AND DRAINAGE ABSCESS N/A 12/20/2015   Procedure: INCISION AND DRAINAGE POSTERIOR NECK MASS;  Surgeon: Darnell Level, MD;  Location: [facility]S;  Service: General;  Laterality: N/A;   INCISION AND DRAINAGE ABSCESS Left 07/10/2004   middle finger   IR IVC FILTER PLMT / S&I /IMG GUID/MOD SED  07/21/2019   IR RADIOLOGIST EVAL & MGMT  12/14/2019   IR RADIOLOGIST EVAL & MGMT  12/21/2020   IR VENOGRAM RENAL UNI RIGHT  07/21/2019   MASS EXCISION N/A 07/21/2017   Procedure: EXCISION OF BENIGN NECK LESION WITH LAYERED CLOSURE;  Surgeon: Glenna Fellows, MD;  Location: [facility];  Service: Plastics;  Laterality: N/A;   MASS EXCISION N/A 11/10/2017   Procedure: EXCISION BENIGN LESION OF THE NECK WITH LAYERED CLOSURE;  Surgeon: Glenna Fellows, MD;  Location: [facility];  Service: Plastics;  Laterality: N/A;   Patient Active Problem List   Diagnosis Date Noted   Hx of adenomatous colonic polyps 06/01/2023   COVID-19 virus infection 04/21/2021   Left-sided weakness 04/21/2021  S/P craniotomy 06/05/2020   History of cranioplasty 06/05/2020   Peri-rectal abscess 02/14/2020   Abnormal CT scan, pelvis 02/14/2020   Pancytopenia (HCC) 12/02/2019   Nephrolithiasis 12/02/2019   Hydronephrosis with renal and ureteral calculus obstruction 12/02/2019   Rectal pain 12/02/2019   Hyperkalemia 12/02/2019   Reactive depression    Wound infection after surgery    Sleep disturbance    Dysphagia, post-stroke    Transaminitis    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   Cerebral abscess    Urinary tract infection without hematuria     Altered mental status    Primary hypercoagulable state (HCC)    Acute pulmonary embolism without acute cor pulmonale (HCC)    Deep vein thrombosis (DVT) of non-extremity vein    Hypokalemia    Acute blood loss anemia    Leukocytosis    Endotracheal tube present    Acute respiratory failure with hypoxemia (HCC)    Stroke (cerebrum) (HCC) 07/19/2019   Pressure injury of skin 07/19/2019   Acute CVA (cerebrovascular accident) (HCC)    Encephalopathy    Dysphagia    Acute encephalopathy    Essential hypertension    Obesity 03/12/2016   Scalp abscess 12/20/2015   Neck abscess 12/20/2015   Pilonidal cyst 02/08/2013    ONSET DATE: 05/01/2023  REFERRING DIAG: I63.9 (ICD-10-CM) - Cerebrovascular accident (CVA), 2020  THERAPY DIAG:  Hemiplegia and hemiparesis following cerebral infarction affecting left non-dominant side (HCC)  Muscle weakness (generalized)  Rationale for Evaluation and Treatment: Rehabilitation  SUBJECTIVE:   SUBJECTIVE STATEMENT: Patient reports that his splint is very comfortable.  He reports working on his arm movements and 'talking' to his arm to try and get it to move.  Patient has Botox appointment next week before OT session and OT POC to be extended to work on arm after injections.  Pt accompanied by: self  PERTINENT HISTORY: Patient called the doctor's office stating he needs a new brace for his left hand. He said that therapy gave him his last one about 2 or 3 years ago.   Presented 07/19/2019 with L sided weakness with diagnosis of Right middle cerebral artery stroke.  Patient underwent decompressive right hemicraniectomy with abdominal flap implant 07/19/2019 Inpatient Rehab: 09/06/2019 - 10/07/2019  Multiple outpatient therapy sessions with most recent being aquatic therapy at the beginning of 2024.  PRECAUTIONS: Fall  WEIGHT BEARING RESTRICTIONS: No  PAIN:  Are you having pain? Patient reports no pain at rest but yes to pain with mobility.  No and Yes: NPRS scale: 5/10 Pain location: L shoulder Pain description: Sharp Aggravating factors: arm dangling Relieving factors: resting it  FALLS: Has patient fallen in last 6 months? No  LIVING ENVIRONMENT:   Lives with: lives with spouse & children (82 yo daughter/14 yo son)  Patient has nurse that helps with bathing M-F x 3 hours/day.  Shower is upstairs so he generally sponge bathes.  Lives in: House Stairs: yes 3 steps to enter with rail on the R side entering; full flight of stairs (17 steps) indoors to get upstairs to shower but no longer goes up there s/p previous fall nearly 2 years go Has following equipment at home: Wheelchair (manual) and hospital bed; [facility] for bathing at bedside but goes to the bathroom to toilet; quad cane  PLOF: Independent with basic ADLs, Independent with household mobility without device, Independent with transfers, Needs assistance with ADLs, and Needs assistance with homemaking Patient spends more time in [facility] at home due  to moving around quicker but does walk around where/when he can daily with his quad cane  PATIENT GOALS: Replace/repair hand splint.  Put on his L shoe (with AFO).  OBJECTIVE:   HAND DOMINANCE: Right  ADLs: Overall ADLs: Needs assistance with LB ADLs and has a 'nurse' 5x/week x 3 hours Transfers/ambulation related to ADLs: MI with quad cane, uses WC to get around quicker at home Eating: Spouse or nurse cuts his food  Grooming: Patient shaves himself with clippers ( but noted to have missed the back of his head at OT eval today) UB Dressing: Shirt sometimes gets caught on his shoulder but generally can manage a t shirt himself LB Dressing: Can do underwear and pants & R sock/shoe but can't do L sock or shoe Toileting: MI with quad cane Bathing: Can bathe UB and legs but can't wash his feet and R arm; has brush to clean his back, sits on BSC at bedside to wash his bottom  Tub Shower transfers: NA Equipment: bed side commode,  Reacher, and Long handled sponge  IADLs: Shopping: Spouse Light housekeeping: Spouse/Nurse Meal Prep: Did not ask Community mobility: Drove himself to therapy today Medication management: Did not ask Financial management: Did not ask Handwriting: 100% legible - R handed  MOBILITY STATUS: Needs Assist: unable to ambulate quickly for toielitng etc, Hx of falls, difficulty with turns, difficulty carrying objections with ambulation, and quad cane  POSTURE COMMENTS:  weight shift right Sitting balance:  WFL  ACTIVITY TOLERANCE: Activity tolerance: Seated good, standing/gait limited  FUNCTIONAL OUTCOME MEASURES: TBD  UPPER EXTREMITY ROM:    Active ROM Right eval Left eval  Shoulder flexion WFL AROM poor except for hand grip.  PROM Good elbow and distal but limited with L shoulder due to subluxation and discomfort.  Shoulder abduction    Shoulder adduction    Shoulder extension    Shoulder internal rotation    Shoulder external rotation    Elbow flexion    Elbow extension    Wrist flexion    Wrist extension    Wrist ulnar deviation    Wrist radial deviation    Wrist pronation    Wrist supination    (Blank rows = not tested)  UPPER EXTREMITY MMT:     MMT Right eval Left eval  Shoulder flexion 5/5 0-1/5 - minimal flickers of muscle movement noted except for digits ie) able to squeeze functionally but can not open digits.   Digital flexion 3/5  Shoulder abduction    Shoulder adduction    Shoulder extension    Shoulder internal rotation    Shoulder external rotation    Middle trapezius    Lower trapezius    Elbow flexion    Elbow extension    Wrist flexion    Wrist extension    Wrist ulnar deviation    Wrist radial deviation    Wrist pronation    Wrist supination    (Blank rows = not tested)  HAND FUNCTION: R UE WFL; L UE Poor - can grasp item placed in hand but can not let go and can not move limb to pick up an item  COORDINATION: R UE WFL; L UE  NA/poor  SENSATION: R UE WFL; L UE impaired  EDEMA: R UE NA; L UE slight  MUSCLE TONE: LUE: Moderate, Flaccid, and Shoulder flaccid with significant shoulder subluxation > 2 fingers widths and hand gets fisted and is difficulty to open ie) at night and then his hands hurts  in the morning  COGNITION: Overall cognitive status: Impaired - Extra time needed to follow directions and answer questions at times.  VISION ASSESSMENT: Not tested  PERCEPTION: NT  PRAXIS: NT  Evaluation OBSERVATIONS: Patient is a tall black male with significant left hemiplegia with flaccid shoulder who arrived today with a custom thermoplastic resting hand splint on his left hand which was sliding off and noted to have poor straps to hold it in place.  Patient demonstrates good passive range of motion of digital extension however has no active extension of digits with home resulting and fisted hand often for patient throughout the day/night.  Patient ambulates slowly and has discomfort and left shoulder which hangs due to flaccid hemiplegia at shoulder.  He has at least a 2 finger widths shoulder subluxation but can perform digital flexion to fist his hand.   TODAY'S TREATMENT:                                                                                                                              DATE: 06/10/23   Neuromuscular Re-ed - Neuro re-education completed to help improve motor control of LUE s/p hemiplegia from CVA.  Pt engaged in performing LUE motions to improve motor control of L hand and arm.  He is able to squeeze his hand upon verbal request but needs max cues to not squeeze his hand when trying to push the arm back.  He worked on pulling his L arm towards himself with an empty can in his hand to simulate a container of food.  Assistance is provided to move the arm back across the tabletop away from his body ie) extending elbow and pushing shoulder forward.  Patient requiring verbal, visual and  tactile cues for neuromuscular re-education to ensure proper form/movement. He was guided to move his arm on a towel/washcloth with his hand atop a tennis ball to slide across the table top.  Tactile input and cues provided to his L shoulder during ROM being elicited with NRE ie) to push forward.   He is not comfortable letting his arm dangle beside him ie) to stretch his elbow out and is encouraged to work on table top.  Pt education provided for how movements translate to ADLs - especially holding an object for self feeding. Pt receptive and accepting to hands-on guidance, visual feedback and verbal cues to improve LUE motions.    PATIENT EDUCATION: Education details: Buddy Duty Person educated: Patient Education method: Explanation, Demonstration, Tactile cues, and Verbal cues Education comprehension: verbalized understanding, returned demonstration, verbal cues required, tactile cues required, and needs further education  HOME EXERCISE PROGRAM: Need to print HEP ideas for patient and family   GOALS: Goals reviewed with patient? Yes   LONG TERM GOALS: Target date: 06/19/23  Patient will have appropriate resting hand splint and/or roll for alternating wear schedule for L UE with good fit, stretch, comfort and positioning of L digits and wrist. Baseline: Thermoplastic  splint in ill-repair.  Had previous pre-fab splint > 2-3 years ago but unable to use it comfortably.  Goal status: MET  2.  Patient will be independent with directing caregivers to apply splint and care for splint - appropriately cleaning and removing/donning cover to splint. Baseline: MI with current thermoplastic splint. Goal status: MET  3.  Patient will be independent with HEP for L UE hemiplegia to work on shoulder approximation and comfort. Baseline: 2 finger width shoulder subluxation and pain with ambulation. Goal status: IN PROGRESS  4.  Patient will have information re: hemi-sling options to help with shoulder  positioning for improved comfort with gait.  Baseline: Previously used sling but it pulled on his neck. Goal status: IN PROGRESS  5.  Patient will try modified techniques, AE and positions to help with L LE dressing including sock and shoe (with AFO).  Baseline: Dependent on caregiver/spouse. Goal status: IN PROGRESS  6.  Patient will be aware of options for AE as needed for L hemiplegia based on areas identified as problems. Baseline: In need of new reacher. Goal status: IN PROGRESS  ASSESSMENT:  CLINICAL IMPRESSION: Patient is seen today for occupational therapy treatment today with focus on L UE AAROM.  He is able to pull the arm across the table and squeeze with hand and continues to need increased assistance to open his hand or his arm/hand away from his body.  Patient will benefit from continued skilled occupational therapy services to update handouts for HEP, provide education re: AE and hemisling options as well as techniques for lower body dressing and recommendations as he is scheduled for Botox injections next week.  PERFORMANCE DEFICITS: in functional skills including ADLs, IADLs, coordination, dexterity, sensation, tone, ROM, strength, pain, muscle spasms, flexibility, Fine motor control, Gross motor control, skin integrity, and UE functional use, cognitive skills including learn and understand, and psychosocial skills including routines and behaviors.   IMPAIRMENTS: are limiting patient from ADLs, rest and sleep, leisure, and social participation.   CO-MORBIDITIES: may have co-morbidities  that affects occupational performance. Patient will benefit from skilled OT to address above impairments and improve overall function.  REHAB POTENTIAL: Good   PLAN:  OT FREQUENCY: 1x/week  OT DURATION: 6 weeks  PLANNED INTERVENTIONS: self care/ADL training, therapeutic exercise, therapeutic activity, neuromuscular re-education, manual therapy, passive range of motion, splinting,  patient/family education, coping strategies training, and DME and/or AE instructions  RECOMMENDED OTHER SERVICES: NA at this time  CONSULTED AND AGREED WITH PLAN OF CARE: Patient  PLAN FOR NEXT SESSION:   Re certification PW s/p receipt of Botox injections for continued therapy.  Review hemi Sling options for L shoulder discomfort.  Review ADL comp strategies to put his shoe on with AFO ie) use of hospital bed   PRINT HEP ideas for L UE positioning and AAROM.  Check into Aquatic activity/therapy options s/p Botox injections.    Victorino Sparrow, OT 06/10/2023, 4:26 PM

## 2023-06-16 ENCOUNTER — Encounter
Payer: Medicare Other | Attending: Physical Medicine and Rehabilitation | Admitting: Physical Medicine and Rehabilitation

## 2023-06-16 ENCOUNTER — Encounter: Payer: Self-pay | Admitting: Physical Medicine and Rehabilitation

## 2023-06-16 VITALS — BP 115/80 | HR 73 | Ht 72.0 in | Wt 285.0 lb

## 2023-06-16 DIAGNOSIS — R252 Cramp and spasm: Secondary | ICD-10-CM | POA: Insufficient documentation

## 2023-06-16 DIAGNOSIS — M62838 Other muscle spasm: Secondary | ICD-10-CM | POA: Diagnosis present

## 2023-06-16 MED ORDER — ONABOTULINUMTOXINA 100 UNITS IJ SOLR
100.0000 [IU] | Freq: Once | INTRAMUSCULAR | Status: AC
Start: 2023-06-16 — End: 2023-06-16
  Administered 2023-06-16: 100 [IU] via INTRAMUSCULAR

## 2023-06-16 MED ORDER — SODIUM CHLORIDE (PF) 0.9 % IJ SOLN
3.0000 mL | Freq: Once | INTRAMUSCULAR | Status: AC
Start: 2023-06-16 — End: 2023-06-16
  Administered 2023-06-16: 3 mL via INTRAVENOUS

## 2023-06-16 NOTE — Progress Notes (Signed)
Botox for muscle spasticity  Dilution: 1:1 Units/ml Indication: Severe spasticity which interferes with ADL,mobility and/or  hygiene and is unresponsive to medication management and other conservative care Informed consent was obtained after describing risks and benefits of the procedure with the patient. This includes bleeding, bruising, infection, excessive weakness, or medication side effects. A REMS form is on file and signed.  Pectoralis: 100  All injections were done after obtaining appropriate [place] visualization and after negative drawback for blood. The patient tolerated the procedure well. Post procedure instructions were given. A followup appointment was made.

## 2023-06-17 ENCOUNTER — Ambulatory Visit: Payer: Medicare Other | Admitting: Occupational Therapy

## 2023-06-17 DIAGNOSIS — M24542 Contracture, left hand: Secondary | ICD-10-CM

## 2023-06-17 DIAGNOSIS — I69354 Hemiplegia and hemiparesis following cerebral infarction affecting left non-dominant side: Secondary | ICD-10-CM

## 2023-06-17 DIAGNOSIS — M6281 Muscle weakness (generalized): Secondary | ICD-10-CM

## 2023-06-17 DIAGNOSIS — G8929 Other chronic pain: Secondary | ICD-10-CM

## 2023-06-17 NOTE — Patient Instructions (Signed)
Access Code: [email or web address] URL: [email or web address] Date: 06/17/2023 Prepared by: Amada Kingfisher  Exercises - Seated Shoulder Flexion Towel Slide at Table Top  - 2 x daily - 10 reps  Setup Begin sitting facing a table or counter top with your hand resting flat on a towel.  Movement Slowly lean forward to slide your hand and towel across the table. Return to the starting position and repeat. Push out to the side (away from your body). Gently pull yur arm back to your body (BUT keep your hand as flat as possible). Can also put your hand on a tennis ball and draw shapes on the table top.  Tip Try to avoid shrugging your shoulder during the exercise and make sure your hand stays on the table.

## 2023-06-17 NOTE — Therapy (Signed)
OUTPATIENT OCCUPATIONAL THERAPY NEURO TREATMENT & PROGRESS REPORT  Patient Name: Matthew Black MRN: [id number] DOB:[birth year], 47 y.o., male Today's Date: 06/17/2023  PCP: Esperanza Richters, PA-C REFERRING PROVIDER: Horton Chin, MD  END OF SESSION:  OT End of Session - 06/17/23 1408     Visit Number 6    Number of Visits 7    Date for OT Re-Evaluation 06/19/23    Authorization Type Medicare A & B  20% coinsurance    Progress Note Due on Visit 10    OT Start Time 1409    OT Stop Time 1449    OT Time Calculation (min) 40 min    Activity Tolerance Patient tolerated treatment well    Behavior During Therapy [facility] for tasks assessed/performed             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   Stroke (HCC)    RICA, R A1, R MCA occlusion 07/19/19   Past Surgical History:  Procedure Laterality Date   CRANIOPLASTY Right 06/05/2020   Procedure: CRANIOPLASTY;  Surgeon: Lisbeth Renshaw, MD;  Location: [facility] OR;  Service: Neurosurgery;  Laterality: Right;  right   CRANIOTOMY Right 07/19/2019   Procedure: RIGHT HEMI-CRANIECTOMY With implantation of skull flap to abdominal wall;  Surgeon: Lisbeth Renshaw, MD;  Location: [facility] OR;  Service: Neurosurgery;  Laterality: Right;   CYST EXCISION N/A 10/08/2016   Procedure: EXCISION OF POSTERIOR NECK CYST;  Surgeon: Berna Bue, MD;  Location: [facility] ORS;  Service: General;  Laterality: N/A;   CYSTOSCOPY/URETEROSCOPY/HOLMIUM LASER/STENT PLACEMENT Right 03/16/2020   Procedure: CYSTOSCOPY RIGHT RETROGRADE PYELOGRAM URETEROSCOPY/HOLMIUM  LASER/STENT PLACEMENT;  Surgeon: Crista Elliot, MD;  Location: [facility] ORS;  Service: Urology;  Laterality: Right;   INCISION AND DRAINAGE ABSCESS N/A 09/22/2014   Procedure: INCISION AND DRAINAGE ABSCESS POSTERIOR NECK;  Surgeon: Valarie Merino, MD;  Location: [facility] ORS;  Service: General;  Laterality: N/A;   INCISION AND DRAINAGE ABSCESS N/A 12/20/2015   Procedure: INCISION AND DRAINAGE POSTERIOR NECK MASS;  Surgeon: Darnell Level, MD;  Location: [facility] ORS;  Service: General;  Laterality: N/A;   INCISION AND DRAINAGE ABSCESS Left 07/10/2004   middle finger   IR IVC FILTER PLMT / S&I /IMG GUID/MOD SED  07/21/2019   IR RADIOLOGIST EVAL & MGMT  12/14/2019   IR RADIOLOGIST EVAL & MGMT  12/21/2020   IR VENOGRAM RENAL UNI RIGHT  07/21/2019   MASS EXCISION N/A 07/21/2017   Procedure: EXCISION OF BENIGN NECK LESION WITH LAYERED CLOSURE;  Surgeon: Glenna Fellows, MD;  Location: [facility];  Service: Plastics;  Laterality: N/A;   MASS EXCISION N/A 11/10/2017   Procedure: EXCISION BENIGN LESION OF THE NECK WITH LAYERED CLOSURE;  Surgeon: Glenna Fellows, MD;  Location: [facility];  Service: Plastics;  Laterality: N/A;   Patient Active Problem List   Diagnosis Date Noted   Hx of adenomatous colonic polyps 06/01/2023   COVID-19 virus infection 04/21/2021   Left-sided weakness 04/21/2021   S/P craniotomy 06/05/2020  History of cranioplasty 06/05/2020   Peri-rectal abscess 02/14/2020   Abnormal CT scan, pelvis 02/14/2020   Pancytopenia (HCC) 12/02/2019   Nephrolithiasis 12/02/2019   Hydronephrosis with renal and ureteral calculus obstruction 12/02/2019   Rectal pain 12/02/2019   Hyperkalemia 12/02/2019   Reactive depression    Wound infection after surgery    Sleep disturbance    Dysphagia, post-stroke    Transaminitis    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   Cerebral abscess    Urinary tract infection without hematuria    Altered mental status     Primary hypercoagulable state (HCC)    Acute pulmonary embolism without acute cor pulmonale (HCC)    Deep vein thrombosis (DVT) of non-extremity vein    Hypokalemia    Acute blood loss anemia    Leukocytosis    Endotracheal tube present    Acute respiratory failure with hypoxemia (HCC)    Stroke (cerebrum) (HCC) 07/19/2019   Pressure injury of skin 07/19/2019   Acute CVA (cerebrovascular accident) (HCC)    Encephalopathy    Dysphagia    Acute encephalopathy    Essential hypertension    Obesity 03/12/2016   Scalp abscess 12/20/2015   Neck abscess 12/20/2015   Pilonidal cyst 02/08/2013    ONSET DATE: 05/01/2023  REFERRING DIAG: I63.9 (ICD-10-CM) - Cerebrovascular accident (CVA), 2020  THERAPY DIAG:  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  Patient reports that his splint is still great.  Pt accompanied by: self  PERTINENT HISTORY:   Patient called the doctor's office stating he needs a new brace for his left hand. He said that therapy gave him his last one about 2 or 3 years ago.  Presented 07/19/2019 with L sided weakness with diagnosis of Right middle cerebral artery stroke.  Patient underwent decompressive right hemicraniectomy with abdominal flap implant 07/19/2019 Inpatient Rehab: 09/06/2019 - 10/07/2019  Multiple outpatient therapy sessions with most recent being aquatic therapy at the beginning of 2024.  PRECAUTIONS: Fall  WEIGHT BEARING RESTRICTIONS: No  PAIN:  Are you having pain? Yes: NPRS scale: about 5/10 Pain location: L shoulder Pain description: sore Aggravating factors: arm dangling Relieving factors: resting it  FALLS: Has  patient fallen in last 6 months? No  LIVING ENVIRONMENT:   Lives with: lives with spouse & children (41 yo daughter/14 yo son)  Patient has nurse that helps with bathing M-F x 3 hours/day.  Shower is upstairs so he generally sponge bathes.  Lives in: House Stairs: yes 3 steps to enter with rail on the R side entering; full flight of stairs (17 steps) indoors to get upstairs to shower but no longer goes up there s/p previous fall nearly 2 years go Has following equipment at home: Wheelchair (manual) and hospital bed; BSC for bathing at bedside but goes to the bathroom to toilet; quad cane  PLOF: Independent with basic ADLs, Independent with household mobility without device, Independent with transfers, Needs assistance with ADLs, and Needs assistance with homemaking Patient spends more time in [facility] at home due to moving around quicker but does walk around where/when he can daily with his quad cane  PATIENT GOALS: Replace/repair hand splint.  Put on his L shoe (with  AFO).  OBJECTIVE:   HAND DOMINANCE: Right  ADLs: Overall ADLs: Needs assistance with LB ADLs and has a 'nurse' 5x/week x 3 hours Transfers/ambulation related to ADLs: MI with quad cane, uses WC to get around quicker at home Eating: Spouse or nurse cuts his food  Grooming: Patient shaves himself with clippers ( but noted to have missed the back of his head at OT eval today) UB Dressing: Shirt sometimes gets caught on his shoulder but generally can manage a t shirt himself LB Dressing: Can do underwear and pants & R sock/shoe but can't do L sock or shoe Toileting: MI with quad cane Bathing: Can bathe UB and legs but can't wash his feet and R arm; has brush to clean his back, sits on BSC at bedside to wash his bottom  Tub Shower transfers: NA Equipment: bed side commode, Reacher, and Long handled sponge  IADLs: Shopping: Spouse Light housekeeping: Spouse/Nurse Meal Prep: Did not ask Community mobility: Drove himself to therapy  today Medication management: Did not ask Financial management: Did not ask Handwriting: 100% legible - R handed  MOBILITY STATUS: Needs Assist: unable to ambulate quickly for toielitng etc, Hx of falls, difficulty with turns, difficulty carrying objections with ambulation, and quad cane  POSTURE COMMENTS:  weight shift right Sitting balance:  WFL  ACTIVITY TOLERANCE: Activity tolerance: Seated good, standing/gait limited  FUNCTIONAL OUTCOME MEASURES: TBD  UPPER EXTREMITY ROM:    Active ROM Right eval Left eval  Shoulder flexion WFL AROM poor except for hand grip.  PROM Good elbow and distal but limited with L shoulder due to subluxation and discomfort.  Shoulder abduction    Shoulder adduction    Shoulder extension    Shoulder internal rotation    Shoulder external rotation    Elbow flexion    Elbow extension    Wrist flexion    Wrist extension    Wrist ulnar deviation    Wrist radial deviation    Wrist pronation    Wrist supination    (Blank rows = not tested)  UPPER EXTREMITY MMT:     MMT Right eval Left eval  Shoulder flexion 5/5 0-1/5 - minimal flickers of muscle movement noted except for digits ie) able to squeeze functionally but can not open digits.   Digital flexion 3/5  Shoulder abduction    Shoulder adduction    Shoulder extension    Shoulder internal rotation    Shoulder external rotation    Middle trapezius    Lower trapezius    Elbow flexion    Elbow extension    Wrist flexion    Wrist extension    Wrist ulnar deviation    Wrist radial deviation    Wrist pronation    Wrist supination    (Blank rows = not tested)  HAND FUNCTION: R UE WFL; L UE Poor - can grasp item placed in hand but can not let go and can not move limb to pick up an item  COORDINATION: R UE WFL; L UE NA/poor  SENSATION: R UE WFL; L UE impaired  EDEMA: R UE NA; L UE slight  MUSCLE TONE: LUE: Moderate, Flaccid, and Shoulder flaccid with significant shoulder  subluxation > 2 fingers widths and hand gets fisted and is difficulty to open ie) at night and then his hands hurts in the morning  COGNITION: Overall cognitive status: Impaired - Extra time needed to follow directions and answer questions at times.  VISION ASSESSMENT: Not tested  PERCEPTION: NT  PRAXIS: NT  Evaluation OBSERVATIONS: Patient is a tall black male with significant left hemiplegia with flaccid shoulder who arrived today with a custom thermoplastic resting hand splint on his left hand which was sliding off and noted to have poor straps to hold it in place.  Patient demonstrates good passive range of motion of digital extension however has no active extension of digits with home resulting and fisted hand often for patient throughout the day/night.  Patient ambulates slowly and has discomfort and left shoulder which hangs due to flaccid hemiplegia at shoulder.  He has at least a 2 finger widths shoulder subluxation but can perform digital flexion to fist his hand.   TODAY'S TREATMENT:                                                                                                                              DATE: 06/17/23   Neuromuscular Re-ed - Neuro re-education completed to help improve ROM and motor control of LUE s/p hemiplegia from CVA.  Pt got Botox yesterday ain chest (ie pec region) and is engaged in performing table top LUE movements to stretch chest/pec out to the side and then to move his hand .  He is able to squeeze his hand but is encouraged to keep his hand open and as relaxed as possible during table top activities with guidance to opne thumb and index finger first as key points of control to then stretch his other fingers flat on towel or over a tennis ball to slide his hand and arm across table.  He continues to work on pulling his L arm towards himself with encouragement to stop before he fists his hand.  He is encouraged to used R UE to move the L arm back across the  tabletop away from his body ie) extending elbow and pushing shoulder forward and slightly abducted ie) scaption.  Patient requiring verbal, visual and tactile cues for neuromuscular re-education to ensure proper form/movement. He was guided to move his arm on a towel/washcloth with his hand atop a tennis ball to slide across the table top.  Tactile input and cues provided to his L shoulder during ROM being elicited with NRE ie) to push forward.     Exercise handout provided for Seated Shoulder Flexion Towel Slide at Table Top  - 2 x daily - 10 reps   Instruction provided to perform movement by: Slowly leaning forward to slide your hand and towel across the table. Return to the starting position and repeat. Push out to the side (away from your body). Gently pull your arm back to your body (BUT keep your hand as flat as possible). Can also put your hand on a tennis ball and draw shapes on the table top. Tip - Try to avoid shrugging your shoulder during the exercise and make sure your hand stays on the table.   PATIENT EDUCATION: Education details: LUE AAROM on table top with image provided  Person educated: Patient Education method: Explanation, Demonstration, Tactile cues, Verbal cues, and Handouts Education comprehension: verbalized understanding, returned demonstration, verbal cues required, tactile cues required, and needs further education  HOME EXERCISE PROGRAM: 06/17/23: Table Top Slides - Access Code: [email or web address]    GOALS: Goals reviewed with patient? Yes   LONG TERM GOALS: Target date: 08/14/23  Patient will have appropriate resting hand splint and/or roll for alternating wear schedule for L UE with good fit, stretch, comfort and positioning of L digits and wrist. Baseline: Thermoplastic splint in ill-repair.  Had previous pre-fab splint > 2-3 years ago but unable to use it comfortably.  Goal status: MET  2.  Patient will be independent with  directing caregivers to apply splint and care for splint - appropriately cleaning and removing/donning cover to splint. Baseline: MI with current thermoplastic splint. Goal status: MET  3.  Patient will be independent with HEP for L UE hemiplegia to work on shoulder approximation and comfort. Baseline: 2 finger width shoulder subluxation and pain with ambulation. Goal status: IN PROGRESS  4.  Patient will have information re: hemi-sling options to help with shoulder positioning for improved comfort with gait.  Baseline: Previously used sling but it pulled on his neck. Goal status: IN PROGRESS  5.  Patient will try modified techniques, AE and positions to help with L LE dressing including sock and shoe (with AFO).  Baseline: Dependent on caregiver/spouse. Goal status: IN PROGRESS  6.  Patient will be aware of options for AE as needed for L hemiplegia based on areas identified as problems. Baseline: In need of new reacher. Goal status: IN PROGRESS  ASSESSMENT:  CLINICAL IMPRESSION: This 6th progress note is for dates: 05/04/23/ to 06/17/2023. Pt has met 2/6 LTGs and is making progress towards remaining goals.  Sessions have focussed on L UE PROM/AAROM, obtaining new splint and now maximizing ROM s/p receipt of Botox injection yesterday.  He is able to pull the arm across the table and squeeze with hand and continues to need increased assistance to open his hand and move his arm/hand away from his body.  Patient will benefit from continued skilled occupational therapy services to update handouts for HEP, provide education re: AE and hemisling options as well as techniques for lower body dressing and complete DC recommendations when max rehab potential is met.    PERFORMANCE DEFICITS: in functional skills including ADLs, IADLs, coordination, dexterity, sensation, tone, ROM, strength, pain, muscle spasms, flexibility, Fine motor control, Gross motor control, skin integrity, and UE functional use,  cognitive skills including learn and understand, and psychosocial skills including routines and behaviors.   IMPAIRMENTS: are limiting patient from ADLs, rest and sleep, leisure, and social participation.   CO-MORBIDITIES: may have co-morbidities  that affects occupational performance. Patient will benefit from skilled OT to address above impairments and improve overall function.  REHAB POTENTIAL: Good   PLAN:  OT FREQUENCY: 1x/week  OT DURATION: additional 6 weeks   PLANNED INTERVENTIONS: self care/ADL training, therapeutic exercise, therapeutic activity, neuromuscular re-education, manual therapy, passive range of motion, splinting, patient/family education, coping strategies training, and DME and/or AE instructions  RECOMMENDED OTHER SERVICES: NA at this time  CONSULTED AND AGREED WITH PLAN OF CARE: Patient  PLAN FOR NEXT SESSION:   PRINT further HEP ideas for L UE positioning and AAROM.  Establish info finder/folder for collection of HEP and informational handouts ie) hemi Sling options for L shoulder discomfort, AE options etc.   Review ADL comp strategies to put his  shoe on with AFO ie) use of hospital bed   Check into Aquatic activity/therapy options s/p Botox injections OT versus PT if accepted.    Victorino Sparrow, OT 06/17/2023, 2:53 PM

## 2023-06-24 ENCOUNTER — Ambulatory Visit: Payer: Medicare Other | Admitting: Occupational Therapy

## 2023-06-24 DIAGNOSIS — M24542 Contracture, left hand: Secondary | ICD-10-CM | POA: Diagnosis not present

## 2023-06-24 DIAGNOSIS — M6281 Muscle weakness (generalized): Secondary | ICD-10-CM

## 2023-06-24 DIAGNOSIS — I69354 Hemiplegia and hemiparesis following cerebral infarction affecting left non-dominant side: Secondary | ICD-10-CM

## 2023-06-24 NOTE — Patient Instructions (Signed)
Updated Table Slides with alternate positions  Access Code: [email or web address] URL: [email or web address] Date: 06/24/2023 Prepared by: Amada Kingfisher  Exercises - Seated Shoulder Flexion Towel Slide at Table Top  - 2 x daily - 10 reps - Seated Shoulder Abduction Towel Slide at Table Top  - 2 x daily - 5-10 reps - Seated Shoulder Scaption Slide at Table Top with Forearm in Neutral  - 2 x daily - 10 reps

## 2023-06-24 NOTE — Therapy (Unsigned)
OUTPATIENT OCCUPATIONAL THERAPY NEURO TREATMENT   Patient Name: Matthew Black MRN: [id number] DOB:[birth year], 47 y.o., male Today's Date: 06/24/2023  PCP: Esperanza Richters, PA-C REFERRING PROVIDER: Horton Chin, MD  END OF SESSION:  OT End of Session - 06/24/23 1406     Visit Number 7    Number of Visits 13    Date for OT Re-Evaluation 08/14/23    Authorization Type Medicare A & B  20% coinsurance    Progress Note Due on Visit 16    OT Start Time 1403    OT Stop Time 1444    OT Time Calculation (min) 41 min    Activity Tolerance Patient tolerated treatment well    Behavior During Therapy [facility] for tasks assessed/performed;Anxious             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 OR;  Service: Neurosurgery;  Laterality: Right;   CYST EXCISION N/A 10/08/2016   Procedure: EXCISION OF POSTERIOR NECK CYST;  Surgeon: Berna Bue, MD;  Location: [facility] ORS;  Service: General;  Laterality: N/A;   CYSTOSCOPY/URETEROSCOPY/HOLMIUM LASER/STENT PLACEMENT Right 03/16/2020   Procedure: CYSTOSCOPY RIGHT RETROGRADE PYELOGRAM URETEROSCOPY/HOLMIUM  LASER/STENT PLACEMENT;  Surgeon: Crista Elliot, MD;  Location: [facility] ORS;  Service: Urology;  Laterality: Right;   INCISION AND DRAINAGE ABSCESS N/A 09/22/2014   Procedure: INCISION AND DRAINAGE ABSCESS POSTERIOR NECK;  Surgeon: Valarie Merino, MD;  Location: [facility] ORS;  Service: General;  Laterality: N/A;   INCISION AND DRAINAGE ABSCESS N/A 12/20/2015   Procedure: INCISION AND DRAINAGE POSTERIOR NECK MASS;  Surgeon: Darnell Level, MD;  Location: [facility] ORS;  Service: General;  Laterality: N/A;   INCISION AND DRAINAGE ABSCESS Left 07/10/2004   middle finger   IR IVC FILTER PLMT / S&I /IMG GUID/MOD SED  07/21/2019   IR RADIOLOGIST EVAL & MGMT  12/14/2019   IR RADIOLOGIST EVAL & MGMT  12/21/2020   IR VENOGRAM RENAL UNI RIGHT  07/21/2019   MASS EXCISION N/A 07/21/2017   Procedure: EXCISION OF BENIGN NECK LESION WITH LAYERED CLOSURE;  Surgeon: Glenna Fellows, MD;  Location: [facility];  Service: Plastics;  Laterality: N/A;   MASS EXCISION N/A 11/10/2017   Procedure: EXCISION BENIGN LESION OF THE NECK WITH LAYERED CLOSURE;  Surgeon: Glenna Fellows, MD;  Location: [facility];  Service: Plastics;  Laterality: N/A;   Patient Active Problem List   Diagnosis Date Noted   Hx of adenomatous colonic polyps 06/01/2023   COVID-19 virus infection 04/21/2021   Left-sided weakness 04/21/2021   S/P craniotomy 06/05/2020   History of  cranioplasty 06/05/2020   Peri-rectal abscess 02/14/2020   Abnormal CT scan, pelvis 02/14/2020   Pancytopenia (HCC) 12/02/2019   Nephrolithiasis 12/02/2019   Hydronephrosis with renal and ureteral calculus obstruction 12/02/2019   Rectal pain 12/02/2019   Hyperkalemia 12/02/2019   Reactive depression    Wound infection after surgery    Sleep disturbance    Dysphagia, post-stroke    Transaminitis    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   Cerebral abscess    Urinary tract infection without hematuria    Altered mental status     Primary hypercoagulable state (HCC)    Acute pulmonary embolism without acute cor pulmonale (HCC)    Deep vein thrombosis (DVT) of non-extremity vein    Hypokalemia    Acute blood loss anemia    Leukocytosis    Endotracheal tube present    Acute respiratory failure with hypoxemia (HCC)    Stroke (cerebrum) (HCC) 07/19/2019   Pressure injury of skin 07/19/2019   Acute CVA (cerebrovascular accident) (HCC)    Encephalopathy    Dysphagia    Acute encephalopathy    Essential hypertension    Obesity 03/12/2016   Scalp abscess 12/20/2015   Neck abscess 12/20/2015   Pilonidal cyst 02/08/2013    ONSET DATE: 05/01/2023  REFERRING DIAG: I63.9 (ICD-10-CM) - Cerebrovascular accident (CVA), 2020  THERAPY DIAG:  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  He's not sure if he will go this weekend or stay for the Community Event at [facility] for Stroke survivors.   Patient reports that his splint is fine and reports wiping it off to clean it.  Pt accompanied by: self  PERTINENT HISTORY:   Patient called the doctor's office stating he needs a new brace for his left hand. He said that therapy gave him his last one about 2 or 3 years ago.  Presented 07/19/2019 with L sided weakness with diagnosis of Right middle cerebral artery stroke.  Patient underwent decompressive right hemicraniectomy with abdominal flap implant 07/19/2019 Inpatient Rehab: 09/06/2019 - 10/07/2019  Multiple outpatient therapy sessions with most recent being aquatic therapy at the beginning of 2024.  PRECAUTIONS: Fall  WEIGHT BEARING RESTRICTIONS: No  PAIN:  Are you having pain? Yes: NPRS scale: about 5/10 Pain location: L  shoulder Pain description: sore Aggravating factors: arm dangling Relieving factors: resting it  FALLS: Has patient fallen in last 6 months? No  LIVING ENVIRONMENT:   Lives with: lives with spouse & children (45 yo daughter/14 yo son)  Patient has nurse that helps with bathing M-F x 3 hours/day.  Shower is upstairs so he generally sponge bathes.  Lives in: House Stairs: yes 3 steps to enter with rail on the R side entering; full flight of stairs (17 steps) indoors to get upstairs to shower but no longer goes up there s/p previous fall nearly 2 years go Has following equipment at home: Wheelchair (manual) and hospital bed; [facility] for bathing at bedside but goes to the bathroom to toilet; quad cane  PLOF: Independent with basic ADLs, Independent with household mobility without device, Independent with transfers, Needs assistance with ADLs, and Needs assistance with homemaking Patient spends more time in [facility] at home due to moving around quicker  but does walk around where/when he can daily with his quad cane  PATIENT GOALS: Replace/repair hand splint.  Put on his L shoe (with AFO).  OBJECTIVE:   HAND DOMINANCE: Right  ADLs: Overall ADLs: Needs assistance with LB ADLs and has a 'nurse' 5x/week x 3 hours Transfers/ambulation related to ADLs: MI with quad cane, uses WC to get around quicker at home Eating: Spouse or nurse cuts his food  Grooming: Patient shaves himself with clippers ( but noted to have missed the back of his head at OT eval today) UB Dressing: Shirt sometimes gets caught on his shoulder but generally can manage a t shirt himself LB Dressing: Can do underwear and pants & R sock/shoe but can't do L sock or shoe Toileting: MI with quad cane Bathing: Can bathe UB and legs but can't wash his feet and R arm; has brush to clean his back, sits on BSC at bedside to wash his bottom  Tub Shower transfers: NA Equipment: bed side commode, Reacher, and Long handled  sponge  IADLs: Shopping: Spouse Light housekeeping: Spouse/Nurse Meal Prep: Did not ask Community mobility: Drove himself to therapy today Medication management: Did not ask Financial management: Did not ask Handwriting: 100% legible - R handed  MOBILITY STATUS: Needs Assist: unable to ambulate quickly for toielitng etc, Hx of falls, difficulty with turns, difficulty carrying objections with ambulation, and quad cane  POSTURE COMMENTS:  weight shift right Sitting balance:  WFL  ACTIVITY TOLERANCE: Activity tolerance: Seated good, standing/gait limited  FUNCTIONAL OUTCOME MEASURES: TBD  UPPER EXTREMITY ROM:    Active ROM Right eval Left eval  Shoulder flexion WFL AROM poor except for hand grip.  PROM Good elbow and distal but limited with L shoulder due to subluxation and discomfort.  Shoulder abduction    Shoulder adduction    Shoulder extension    Shoulder internal rotation    Shoulder external rotation    Elbow flexion    Elbow extension    Wrist flexion    Wrist extension    Wrist ulnar deviation    Wrist radial deviation    Wrist pronation    Wrist supination    (Blank rows = not tested)  UPPER EXTREMITY MMT:     MMT Right eval Left eval  Shoulder flexion 5/5 0-1/5 - minimal flickers of muscle movement noted except for digits ie) able to squeeze functionally but can not open digits.   Digital flexion 3/5  Shoulder abduction    Shoulder adduction    Shoulder extension    Shoulder internal rotation    Shoulder external rotation    Middle trapezius    Lower trapezius    Elbow flexion    Elbow extension    Wrist flexion    Wrist extension    Wrist ulnar deviation    Wrist radial deviation    Wrist pronation    Wrist supination    (Blank rows = not tested)  HAND FUNCTION: R UE WFL; L UE Poor - can grasp item placed in hand but can not let go and can not move limb to pick up an item  COORDINATION: R UE WFL; L UE NA/poor  SENSATION: R UE WFL;  L UE impaired  EDEMA: R UE NA; L UE slight  MUSCLE TONE: LUE: Moderate, Flaccid, and Shoulder flaccid with significant shoulder subluxation > 2 fingers widths and hand gets fisted and is difficulty to open ie) at night and then his hands hurts in the morning  COGNITION: Overall cognitive status: Impaired - Extra time needed to follow directions and answer questions at times.  VISION ASSESSMENT: Not tested  PERCEPTION: NT  PRAXIS: NT  Evaluation OBSERVATIONS: Patient is a tall black male with significant left hemiplegia with flaccid shoulder who arrived today with a custom thermoplastic resting hand splint on his left hand which was sliding off and noted to have poor straps to hold it in place.  Patient demonstrates good passive range of motion of digital extension however has no active extension of digits with home resulting and fisted hand often for patient throughout the day/night.  Patient ambulates slowly and has discomfort and left shoulder which hangs due to flaccid hemiplegia at shoulder.  He has at least a 2 finger widths shoulder subluxation but can perform digital flexion to fist his hand.   TODAY'S TREATMENT:                                                                                                                              DATE: 06/24/23   Neuromuscular Re-ed - Neuro re-education completed to help improve ROM and motor control of LUE s/p hemiplegia from CVA.  Pt engaged in performing table top LUE movements to stretch chest/pec and shoulder in different directions today.  He is shown how to massage tight areas with R hand while stretching ie) around underarm/pec etc to stretch further.  He continues to be shown how to use his R UE to move the L arm back out to the side as needed also.   Patient requires extra time, verbal, visual and tactile cues for neuromuscular re-education to ensure proper form/movement. Tactile input and cues provided to his L shoulder and elbow  during ROM to elicit motions.  Additional images provided for HEP to update motions from flexion only to other motions as follows: h   Exercises - Seated Shoulder Flexion Towel Slide at Table Top  - 2 x daily - 10 reps - Seated Shoulder Abduction Towel Slide at Table Top  - 2 x daily - 5-10 reps - Seated Shoulder Scaption Slide at Table Top with Forearm in Neutral  - 2 x daily - 10 reps    PATIENT EDUCATION: Education details: Buddy Duty on table top with image provided Person educated: Patient Education method: Explanation, Demonstration, Tactile cues, Verbal cues, and Handouts Education comprehension: verbalized understanding, returned demonstration, verbal cues required, tactile cues required, and needs further education  HOME EXERCISE PROGRAM: 06/17/23: Table Top Slides - Access Code: [email or web address]  06/24/23: Additional Table Slides for Abduction/Scaption - same access code   GOALS: Goals reviewed with patient? Yes   LONG TERM GOALS: Target date: 08/14/23  Patient will have appropriate resting hand splint and/or roll for alternating wear schedule for L UE with good fit, stretch, comfort and positioning of L digits and wrist. Baseline: Thermoplastic splint in ill-repair.  Had previous pre-fab splint > 2-3 years ago but unable to use it comfortably.  Goal status: MET  2.  Patient will be independent with directing caregivers to apply splint and care for splint - appropriately cleaning and removing/donning cover to splint. Baseline: MI with current thermoplastic splint. Goal status: MET  3.  Patient will be independent with HEP for L UE hemiplegia to work on shoulder approximation and comfort. Baseline: 2 finger width shoulder subluxation and pain with ambulation. Goal status: IN PROGRESS  4.  Patient will have information re: hemi-sling options to help with shoulder positioning for improved comfort with gait.  Baseline: Previously used sling but it  pulled on his neck. Goal status: IN PROGRESS  5.  Patient will try modified techniques, AE and positions to help with L LE dressing including sock and shoe (with AFO).  Baseline: Dependent on caregiver/spouse. Goal status: MET  6.  Patient will be aware of options for AE as needed for L hemiplegia based on areas identified as problems. Baseline: In need of new reacher. Goal status: IN PROGRESS  ASSESSMENT:  CLINICAL IMPRESSION: Session again focussed on L UE PROM/AAROM for self performance of ROM s/p receipt of Botox injection last week.  He is shown how to vary table top slides to work on increased ROM of shoulder/arm.  Patient will benefit from continued skilled occupational therapy services to update handouts for HEP, provide education for AE as well as techniques for ADLs as needed prior to DC when max rehab potential is met.    PERFORMANCE DEFICITS: in functional skills including ADLs, IADLs, coordination, dexterity, sensation, tone, ROM, strength, pain, muscle spasms, flexibility, Fine motor control, Gross motor control, skin integrity, and UE functional use, cognitive skills including learn and understand, and psychosocial skills including routines and behaviors.   IMPAIRMENTS: are limiting patient from ADLs, rest and sleep, leisure, and social participation.   CO-MORBIDITIES: may have co-morbidities  that affects occupational performance. Patient will benefit from skilled OT to address above impairments and improve overall function.  REHAB POTENTIAL: Good   PLAN:  OT FREQUENCY: 1x/week  OT DURATION: additional 6 weeks   PLANNED INTERVENTIONS: self care/ADL training, therapeutic exercise, therapeutic activity, neuromuscular re-education, manual therapy, passive range of motion, splinting, patient/family education, coping strategies training, and DME and/or AE instructions  RECOMMENDED OTHER SERVICES: NA at this time  CONSULTED AND AGREED WITH PLAN OF CARE: Patient  PLAN  FOR NEXT SESSION:   PRINT further HEP ideas for L UE positioning and AAROM.  Establish info finder/folder for collection of HEP and informational handouts ie) hemi Sling options for L shoulder discomfort, AE options etc.    Victorino Sparrow, OT 06/24/2023, 5:11 PM

## 2023-07-01 ENCOUNTER — Ambulatory Visit: Payer: Medicare Other | Attending: Medical | Admitting: Occupational Therapy

## 2023-07-01 DIAGNOSIS — M24542 Contracture, left hand: Secondary | ICD-10-CM | POA: Insufficient documentation

## 2023-07-01 DIAGNOSIS — R29898 Other symptoms and signs involving the musculoskeletal system: Secondary | ICD-10-CM | POA: Diagnosis present

## 2023-07-01 DIAGNOSIS — R29818 Other symptoms and signs involving the nervous system: Secondary | ICD-10-CM | POA: Insufficient documentation

## 2023-07-01 DIAGNOSIS — M25512 Pain in left shoulder: Secondary | ICD-10-CM | POA: Diagnosis present

## 2023-07-01 DIAGNOSIS — I69354 Hemiplegia and hemiparesis following cerebral infarction affecting left non-dominant side: Secondary | ICD-10-CM | POA: Insufficient documentation

## 2023-07-01 DIAGNOSIS — G8929 Other chronic pain: Secondary | ICD-10-CM | POA: Diagnosis present

## 2023-07-01 DIAGNOSIS — R278 Other lack of coordination: Secondary | ICD-10-CM | POA: Insufficient documentation

## 2023-07-01 DIAGNOSIS — M6281 Muscle weakness (generalized): Secondary | ICD-10-CM | POA: Diagnosis present
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
## 2023-07-01 NOTE — Patient Instructions (Addendum)
Updated HEP with Dowel  Access Code: [email or web address] URL: [email or web address] Date: 07/01/2023 Prepared by: Amada Kingfisher  Exercises - Seated Shoulder Abduction AAROM with Dowel  - 1 x daily - 5-10 reps - Seated Shoulder Flexion Extension AAROM with Dowel into Wall  - 1 x daily - 5-10 reps

## 2023-07-08 ENCOUNTER — Ambulatory Visit: Payer: Medicare Other | Admitting: Occupational Therapy

## 2023-07-08 DIAGNOSIS — M24542 Contracture, left hand: Secondary | ICD-10-CM

## 2023-07-08 DIAGNOSIS — I69354 Hemiplegia and hemiparesis following cerebral infarction affecting left non-dominant side: Secondary | ICD-10-CM | POA: Diagnosis not present

## 2023-07-08 DIAGNOSIS — G8929 Other chronic pain: Secondary | ICD-10-CM
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
## 2023-07-08 NOTE — Patient Instructions (Addendum)
Updated HEP  Access Code: [email or web address] URL: [email or web address] Date: 07/08/2023 Prepared by: Amada Kingfisher  Exercises - Seated Shoulder Flexion Towel Slide at Table Top  - 2 x daily - 10 reps - Seated Shoulder Abduction Towel Slide at Table Top  - 2 x daily - 5-10 reps - Seated Shoulder Scaption Slide at Table Top with Forearm in Neutral  - 2 x daily - 10 reps - Seated Shoulder Flexion Extension AAROM with Dowel into Wall  - 1 x daily - 5-10 reps - Seated Shoulder Abduction AAROM with Dowel  - 1 x daily - 5-10 reps - Shoulder Scaption AAROM with Dowel  - 1 x daily - 5-10 reps

## 2023-07-09 ENCOUNTER — Encounter: Payer: Medicare Other | Admitting: Psychology

## 2023-07-15 ENCOUNTER — Ambulatory Visit: Payer: Medicare Other | Admitting: Occupational Therapy

## 2023-07-15 DIAGNOSIS — M24542 Contracture, left hand: Secondary | ICD-10-CM

## 2023-07-15 DIAGNOSIS — I69354 Hemiplegia and hemiparesis following cerebral infarction affecting left non-dominant side: Secondary | ICD-10-CM | POA: Diagnosis not present

## 2023-07-15 DIAGNOSIS — M6281 Muscle weakness (generalized): Secondary | ICD-10-CM

## 2023-07-15 DIAGNOSIS — G8929 Other chronic pain: Secondary | ICD-10-CM

## 2023-07-15 NOTE — Therapy (Signed)
OUTPATIENT OCCUPATIONAL THERAPY NEURO TREATMENT   Patient Name: Matthew Black MRN: [id number] DOB:[birth year], 47 y.o., male Today's Date: 07/15/2023  PCP: Esperanza Richters, PA-C REFERRING PROVIDER: Horton Chin, MD  END OF SESSION:  OT End of Session - 07/15/23 1406     Visit Number 10    Number of Visits 13    Date for OT Re-Evaluation 08/14/23    Authorization Type Medicare A & B  20% coinsurance    Progress Note Due on Visit 16    OT Start Time 1404    OT Stop Time 1449    OT Time Calculation (min) 45 min    Equipment Utilized During Treatment Patient's personal cane, Hand/Finger Glove for LUE    Activity Tolerance Patient tolerated treatment well    Behavior During Therapy [facility] for tasks assessed/performed             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   Stroke (HCC)    RICA, R A1, R MCA occlusion 07/19/19   Past Surgical History:  Procedure Laterality Date   CRANIOPLASTY Right 06/05/2020   Procedure: CRANIOPLASTY;  Surgeon: Lisbeth Renshaw, MD;  Location: [facility] OR;  Service: Neurosurgery;  Laterality: Right;  right   CRANIOTOMY Right 07/19/2019   Procedure: RIGHT HEMI-CRANIECTOMY With implantation of skull flap to abdominal wall;  Surgeon: Lisbeth Renshaw, MD;  Location: [facility] OR;  Service: Neurosurgery;  Laterality: Right;   CYST EXCISION N/A 10/08/2016   Procedure: EXCISION OF POSTERIOR NECK CYST;  Surgeon: Berna Bue, MD;  Location: [facility] ORS;  Service: General;  Laterality: N/A;   CYSTOSCOPY/URETEROSCOPY/HOLMIUM LASER/STENT PLACEMENT Right  03/16/2020   Procedure: CYSTOSCOPY RIGHT RETROGRADE PYELOGRAM URETEROSCOPY/HOLMIUM LASER/STENT PLACEMENT;  Surgeon: Crista Elliot, MD;  Location: [facility] ORS;  Service: Urology;  Laterality: Right;   INCISION AND DRAINAGE ABSCESS N/A 09/22/2014   Procedure: INCISION AND DRAINAGE ABSCESS POSTERIOR NECK;  Surgeon: Valarie Merino, MD;  Location: [facility] ORS;  Service: General;  Laterality: N/A;   INCISION AND DRAINAGE ABSCESS N/A 12/20/2015   Procedure: INCISION AND DRAINAGE POSTERIOR NECK MASS;  Surgeon: Darnell Level, MD;  Location: [facility] ORS;  Service: General;  Laterality: N/A;   INCISION AND DRAINAGE ABSCESS Left 07/10/2004   middle finger   IR IVC FILTER PLMT / S&I /IMG GUID/MOD SED  07/21/2019   IR RADIOLOGIST EVAL & MGMT  12/14/2019   IR RADIOLOGIST EVAL & MGMT  12/21/2020   IR VENOGRAM RENAL UNI RIGHT  07/21/2019   MASS EXCISION N/A 07/21/2017   Procedure: EXCISION OF BENIGN NECK LESION WITH LAYERED CLOSURE;  Surgeon: Glenna Fellows, MD;  Location: [facility];  Service: Plastics;  Laterality: N/A;   MASS EXCISION N/A 11/10/2017   Procedure: EXCISION BENIGN LESION OF THE NECK WITH LAYERED CLOSURE;  Surgeon: Glenna Fellows, MD;  Location: [facility];  Service: Plastics;  Laterality: N/A;   Patient Active Problem List   Diagnosis Date Noted   Hx of adenomatous colonic polyps 06/01/2023   COVID-19 virus infection 04/21/2021  Left-sided weakness 04/21/2021   S/P craniotomy 06/05/2020   History of cranioplasty 06/05/2020   Peri-rectal abscess 02/14/2020   Abnormal CT scan, pelvis 02/14/2020   Pancytopenia (HCC) 12/02/2019   Nephrolithiasis 12/02/2019   Hydronephrosis with renal and ureteral calculus obstruction 12/02/2019   Rectal pain 12/02/2019   Hyperkalemia 12/02/2019   Reactive depression    Wound infection after surgery    Sleep disturbance    Dysphagia, post-stroke    Transaminitis    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   Cerebral  abscess    Urinary tract infection without hematuria    Altered mental status    Primary hypercoagulable state (HCC)    Acute pulmonary embolism without acute cor pulmonale (HCC)    Deep vein thrombosis (DVT) of non-extremity vein    Hypokalemia    Acute blood loss anemia    Leukocytosis    Endotracheal tube present    Acute respiratory failure with hypoxemia (HCC)    Stroke (cerebrum) (HCC) 07/19/2019   Pressure injury of skin 07/19/2019   Acute CVA (cerebrovascular accident) (HCC)    Encephalopathy    Dysphagia    Acute encephalopathy    Essential hypertension    Obesity 03/12/2016   Scalp abscess 12/20/2015   Neck abscess 12/20/2015   Pilonidal cyst 02/08/2013    ONSET DATE: 05/01/2023  REFERRING DIAG: I63.9 (ICD-10-CM) - Cerebrovascular accident (CVA), 2020  THERAPY DIAG:  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  He said he tried to do the exercises with a broom handle but bent it.  Patient had been wearing his splint prior to coming to therapy reports that his makes his hand very relaxed and pt able to report using thumb as key point of control and informing caregivers on his own about how to stretch his hand to apply the splint.    Pt accompanied by: self  PERTINENT HISTORY:   Patient called the doctor's office stating he needs a new brace for his left hand. He said that therapy gave him his last one about 2 or 3 years ago.  Presented 07/19/2019 with L sided weakness with diagnosis of Right middle cerebral artery stroke.  Patient underwent decompressive right hemicraniectomy with abdominal flap implant 07/19/2019 Inpatient Rehab:  09/06/2019 - 10/07/2019  Multiple outpatient therapy sessions with most recent being aquatic therapy at the beginning of 2024.  PRECAUTIONS: Fall  WEIGHT BEARING RESTRICTIONS: No  PAIN:  Are you having pain? Yes: NPRS scale: about 5/10 Pain location: L shoulder Pain description: sore Aggravating factors: arm dangling Relieving factors: resting it  FALLS: Has patient fallen in last 6 months? No  LIVING ENVIRONMENT:   Lives with: lives with spouse & children (81 yo daughter/14 yo son)  Patient has nurse that helps with bathing M-F x 3 hours/day.  Shower is upstairs so he generally sponge bathes.  Lives in: House Stairs: yes 3 steps to enter with rail on the R side entering; full flight of stairs (17 steps) indoors to get upstairs to shower but no longer goes up there s/p previous fall nearly 2 years go Has following equipment at home: Wheelchair (manual) and hospital bed; [facility] for bathing at bedside but goes  to the bathroom to toilet; quad cane  PLOF: Independent with basic ADLs, Independent with household mobility without device, Independent with transfers, Needs assistance with ADLs, and Needs assistance with homemaking Patient spends more time in [facility] at home due to moving around quicker but does walk around where/when he can daily with his quad cane  PATIENT GOALS: Replace/repair hand splint.  Put on his L shoe (with AFO).  OBJECTIVE:   HAND DOMINANCE: Right  ADLs: Overall ADLs: Needs assistance with LB ADLs and has a 'nurse' 5x/week x 3 hours Transfers/ambulation related to ADLs: MI with quad cane, uses WC to get around quicker at home Eating: Spouse or nurse cuts his food  Grooming: Patient shaves himself with clippers ( but noted to have missed the back of his head at OT eval today) UB Dressing: Shirt sometimes gets caught on his shoulder but generally can manage a t shirt himself LB Dressing: Can do underwear and pants & R sock/shoe but can't do L sock or shoe Toileting: MI  with quad cane Bathing: Can bathe UB and legs but can't wash his feet and R arm; has brush to clean his back, sits on BSC at bedside to wash his bottom  Tub Shower transfers: NA Equipment: bed side commode, Reacher, and Long handled sponge  IADLs: Shopping: Spouse Light housekeeping: Spouse/Nurse Meal Prep: Did not ask Community mobility: Drove himself to therapy today Medication management: Did not ask Financial management: Did not ask Handwriting: 100% legible - R handed  MOBILITY STATUS: Needs Assist: unable to ambulate quickly for toielitng etc, Hx of falls, difficulty with turns, difficulty carrying objections with ambulation, and quad cane  POSTURE COMMENTS:  weight shift right Sitting balance:  WFL  ACTIVITY TOLERANCE: Activity tolerance: Seated good, standing/gait limited  FUNCTIONAL OUTCOME MEASURES: TBD  UPPER EXTREMITY ROM:    Active ROM Right eval Left eval  Shoulder flexion WFL AROM poor except for hand grip.  PROM Good elbow and distal but limited with L shoulder due to subluxation and discomfort.  Shoulder abduction    Shoulder adduction    Shoulder extension    Shoulder internal rotation    Shoulder external rotation    Elbow flexion    Elbow extension    Wrist flexion    Wrist extension    Wrist ulnar deviation    Wrist radial deviation    Wrist pronation    Wrist supination    (Blank rows = not tested)  UPPER EXTREMITY MMT:     MMT Right eval Left eval  Shoulder flexion 5/5 0-1/5 - minimal flickers of muscle movement noted except for digits ie) able to squeeze functionally but can not open digits.   Digital flexion 3/5  Shoulder abduction    Shoulder adduction    Shoulder extension    Shoulder internal rotation    Shoulder external rotation    Middle trapezius    Lower trapezius    Elbow flexion    Elbow extension    Wrist flexion    Wrist extension    Wrist ulnar deviation    Wrist radial deviation    Wrist pronation    Wrist  supination    (Blank rows = not tested)  HAND FUNCTION: R UE WFL; L UE Poor - can grasp item placed in hand but can not let go and can not move limb to pick up an item  COORDINATION: R UE WFL; L UE NA/poor  SENSATION: R UE WFL; L UE impaired  EDEMA:  R UE NA; L UE slight  MUSCLE TONE: LUE: Moderate, Flaccid, and Shoulder flaccid with significant shoulder subluxation > 2 fingers widths and hand gets fisted and is difficulty to open ie) at night and then his hands hurts in the morning  COGNITION: Overall cognitive status: Impaired - Extra time needed to follow directions and answer questions at times.  VISION ASSESSMENT: Not tested  PERCEPTION: NT  PRAXIS: NT  Evaluation OBSERVATIONS: Patient is a tall black male with significant left hemiplegia with flaccid shoulder who arrived today with a custom thermoplastic resting hand splint on his left hand which was sliding off and noted to have poor straps to hold it in place.  Patient demonstrates good passive range of motion of digital extension however has no active extension of digits with home resulting and fisted hand often for patient throughout the day/night.  Patient ambulates slowly and has discomfort and left shoulder which hangs due to flaccid hemiplegia at shoulder.  He has at least a 2 finger widths shoulder subluxation but can perform digital flexion to fist his hand.   TODAY'S TREATMENT:                                                                                                                               Neuromuscular Re-ed - Neuro re-education completed to help improve ROM and motor control of LUE s/p hemiplegia from CVA s/p Botox injections several weeks ago.  Pt engaged in performing LUE movements with PVC pipe across tabletop, to stretch chest/pec and shoulder in different directions, with Hemiplegia Hand Training glove applied to L hand and cane.  Pt reports having a grip assist glove at home.  Patient encouraged  to consider a PVC pipe stick if his cane is too difficult and broom handle is too small.  He continues to be encouraged to stretch L arm away from midline comfortably, sliding arm forward on the PVC pipe and massaging tight areas with R hand while stretching ie) around underarm/pec and elbow crease etc to stretch further.  He continues to be shown how to use his R UE to move the L arm back and forth with arm in pronation or neutral positions.   Additional Exercise added to other UE exercises included Seated Gripping Towel, as added to access code, where he is encouraged to hold a rolled washcloth and squeeze to minimize ability to pull it form his hand but then to concentrate on RELAXING his grasp so he could actually move the washcloth) and repeat task focusing on relaxing his grasp over several repetitions.  Activity was translated over to squeezing a plastic bottle with L hand to open snug tops with R hand.   Patient requires extra time, verbal, visual and tactile cues for neuromuscular re-education to ensure proper form/movement. Tactile input and cues provided to his L hand for grasp of his cane and then to shoulder and elbow during ROM to elicit motions.   PATIENT  EDUCATION: Education details: Buddy Duty with cane with images provided Person educated: Patient Education method: Explanation, Demonstration, Tactile cues, Verbal cues, and Handouts Education comprehension: verbalized understanding, returned demonstration, verbal cues required, tactile cues required, and needs further education  HOME EXERCISE PROGRAM: 06/17/23: Table Top Slides - Access Code: [email or web address]  06/24/23: Additional Table Slides for Abduction/Scaption - same access code 07/01/23: Additional Dowel (Cane) AAROM -Flexion/abduction - same access code 07/08/23: Additional Dowel (Cane) AAROM - Scaption - same code    GOALS: Goals reviewed with patient? Yes   LONG TERM GOALS: Target date:  08/14/23  Patient will have appropriate resting hand splint and/or roll for alternating wear schedule for L UE with good fit, stretch, comfort and positioning of L digits and wrist. Baseline: Thermoplastic splint in ill-repair.  Had previous pre-fab splint > 2-3 years ago but unable to use it comfortably.  Goal status: MET  2.  Patient will be independent with directing caregivers to apply splint and care for splint - appropriately cleaning and removing/donning cover to splint. Baseline: MI with current thermoplastic splint. Goal status: MET  3.  Patient will be independent with HEP for L UE hemiplegia to work on shoulder approximation and comfort. Baseline: 2 finger width shoulder subluxation and pain with ambulation. Goal status: IN PROGRESS  4.  Patient will have information re: hemi-sling options to help with shoulder positioning for improved comfort with gait.  Baseline: Previously used sling but it pulled on his neck. Goal status: REVISED -  07/08/23 - handout provided  5.  Patient will try modified techniques, AE and positions to help with L LE dressing including sock and shoe (with AFO).  Baseline: Dependent on caregiver/spouse. Goal status: MET  6.  Patient will be aware of options for AE as needed for L hemiplegia based on areas identified as problems. Baseline: In need of new reacher. Goal status: IN PROGRESS  ASSESSMENT:  CLINICAL IMPRESSION: Session again focussed on L UE PROM/AAROM for self performance of ROM s/p receipt of Botox injection several weeks ago.  He is shown how to use hand training glove (which he says he has at home) to help with table top cane AAROM to increase ROM of shoulder/arm as well as grasp with L hand. S/P wearing his splint prior to therapy, he was easily guided to open his L hand/digits between exercises.  Patient continues to benefit from continued skilled occupational therapy services to update handouts for HEP, provide education for AE as well as  techniques for ADLs as needed prior to DC when max rehab potential is met.    PERFORMANCE DEFICITS: in functional skills including ADLs, IADLs, coordination, dexterity, sensation, tone, ROM, strength, pain, muscle spasms, flexibility, Fine motor control, Gross motor control, skin integrity, and UE functional use, cognitive skills including learn and understand, and psychosocial skills including routines and behaviors.   IMPAIRMENTS: are limiting patient from ADLs, rest and sleep, leisure, and social participation.   CO-MORBIDITIES: may have co-morbidities  that affects occupational performance. Patient will benefit from skilled OT to address above impairments and improve overall function.  REHAB POTENTIAL: Good   PLAN:  OT FREQUENCY: 1x/week  OT DURATION: additional 6 weeks   PLANNED INTERVENTIONS: self care/ADL training, therapeutic exercise, therapeutic activity, neuromuscular re-education, manual therapy, passive range of motion, splinting, patient/family education, coping strategies training, and DME and/or AE instructions  RECOMMENDED OTHER SERVICES: NA at this time  CONSULTED AND AGREED WITH PLAN OF CARE: Patient  PLAN FOR NEXT SESSION:   PRINT  further HEP ideas for L UE positioning and AAROM.  Establish info binder/folder for collection of HEP and informational handouts ie) hemi Sling options for L shoulder discomfort, AE options etc.    Victorino Sparrow, OT 07/15/2023, 3:06 PM

## 2023-07-15 NOTE — Patient Instructions (Signed)
-   Seated Gripping Towel  - 1 x daily - 5-10 reps Access Code: [email or web address] URL: [email or web address] Date: 07/15/2023 Prepared by: Amada Kingfisher

## 2023-07-20 ENCOUNTER — Other Ambulatory Visit: Payer: Self-pay | Admitting: Physical Medicine and Rehabilitation

## 2023-07-20 DIAGNOSIS — R252 Cramp and spasm: Secondary | ICD-10-CM

## 2023-07-22 ENCOUNTER — Ambulatory Visit: Payer: Medicare Other | Admitting: Occupational Therapy

## 2023-07-22 DIAGNOSIS — M6281 Muscle weakness (generalized): Secondary | ICD-10-CM

## 2023-07-22 DIAGNOSIS — I69354 Hemiplegia and hemiparesis following cerebral infarction affecting left non-dominant side: Secondary | ICD-10-CM | POA: Diagnosis not present

## 2023-07-22 DIAGNOSIS — R278 Other lack of coordination: Secondary | ICD-10-CM

## 2023-07-22 DIAGNOSIS — G8929 Other chronic pain: Secondary | ICD-10-CM

## 2023-07-22 NOTE — Therapy (Signed)
OUTPATIENT OCCUPATIONAL THERAPY NEURO TREATMENT   Patient Name: Matthew MAZZOLI MRN: [id number] DOB:August 12, 1976, 47 y.o., male Today's Date: 07/22/2023  PCP: Esperanza Richters, PA-C REFERRING PROVIDER: Horton Chin, MD  END OF SESSION:  OT End of Session - 07/22/23 1359     Visit Number 11    Number of Visits 13   + evaluation   Date for OT Re-Evaluation 08/14/23    Authorization Type Medicare A & B  20% coinsurance    Progress Note Due on Visit 16    OT Start Time 1400    OT Stop Time 1445    OT Time Calculation (min) 45 min    Equipment Utilized During Treatment Kitchen objects    Activity Tolerance Patient tolerated treatment well    Behavior During Therapy [facility] for tasks assessed/performed             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   Stroke (HCC)    RICA, R A1, R MCA occlusion 07/19/19   Past Surgical History:  Procedure Laterality Date   CRANIOPLASTY Right 06/05/2020   Procedure: CRANIOPLASTY;  Surgeon: Lisbeth Renshaw, MD;  Location: [facility] OR;  Service: Neurosurgery;  Laterality: Right;  right   CRANIOTOMY Right 07/19/2019   Procedure: RIGHT HEMI-CRANIECTOMY With implantation of skull flap to abdominal wall;  Surgeon: Lisbeth Renshaw, MD;  Location: [facility] OR;  Service: Neurosurgery;  Laterality: Right;   CYST EXCISION N/A 10/08/2016   Procedure: EXCISION OF POSTERIOR NECK CYST;  Surgeon: Berna Bue, MD;  Location: [facility] ORS;  Service: General;  Laterality: N/A;   CYSTOSCOPY/URETEROSCOPY/HOLMIUM LASER/STENT PLACEMENT Right 03/16/2020   Procedure:  CYSTOSCOPY RIGHT RETROGRADE PYELOGRAM URETEROSCOPY/HOLMIUM LASER/STENT PLACEMENT;  Surgeon: Crista Elliot, MD;  Location: [facility];  Service: Urology;  Laterality: Right;   INCISION AND DRAINAGE ABSCESS N/A 09/22/2014   Procedure: INCISION AND DRAINAGE ABSCESS POSTERIOR NECK;  Surgeon: Valarie Merino, MD;  Location: [facility];  Service: General;  Laterality: N/A;   INCISION AND DRAINAGE ABSCESS N/A 12/20/2015   Procedure: INCISION AND DRAINAGE POSTERIOR NECK MASS;  Surgeon: Darnell Level, MD;  Location: [facility];  Service: General;  Laterality: N/A;   INCISION AND DRAINAGE ABSCESS Left 07/10/2004   middle finger   IR IVC FILTER PLMT / S&I /IMG GUID/MOD SED  07/21/2019   IR RADIOLOGIST EVAL & MGMT  12/14/2019   IR RADIOLOGIST EVAL & MGMT  12/21/2020   IR VENOGRAM RENAL UNI RIGHT  07/21/2019   MASS EXCISION N/A 07/21/2017   Procedure: EXCISION OF BENIGN NECK LESION WITH LAYERED CLOSURE;  Surgeon: Glenna Fellows, MD;  Location: [facility];  Service: Plastics;  Laterality: N/A;   MASS EXCISION N/A 11/10/2017   Procedure: EXCISION BENIGN LESION OF THE NECK WITH LAYERED CLOSURE;  Surgeon: Glenna Fellows, MD;  Location: [facility];  Service: Plastics;  Laterality: N/A;   Patient Active Problem List   Diagnosis Date Noted   Hx of adenomatous colonic polyps 06/01/2023   COVID-19 virus infection 04/21/2021  Left-sided weakness 04/21/2021   S/P craniotomy 06/05/2020   History of cranioplasty 06/05/2020   Peri-rectal abscess 02/14/2020   Abnormal CT scan, pelvis 02/14/2020   Pancytopenia (HCC) 12/02/2019   Nephrolithiasis 12/02/2019   Hydronephrosis with renal and ureteral calculus obstruction 12/02/2019   Rectal pain 12/02/2019   Hyperkalemia 12/02/2019   Reactive depression    Wound infection after surgery    Sleep disturbance    Dysphagia, post-stroke    Transaminitis    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   Cerebral abscess    Urinary tract  infection without hematuria    Altered mental status    Primary hypercoagulable state (HCC)    Acute pulmonary embolism without acute cor pulmonale (HCC)    Deep vein thrombosis (DVT) of non-extremity vein    Hypokalemia    Acute blood loss anemia    Leukocytosis    Endotracheal tube present    Acute respiratory failure with hypoxemia (HCC)    Stroke (cerebrum) (HCC) 07/19/2019   Pressure injury of skin 07/19/2019   Acute CVA (cerebrovascular accident) (HCC)    Encephalopathy    Dysphagia    Acute encephalopathy    Essential hypertension    Obesity 03/12/2016   Scalp abscess 12/20/2015   Neck abscess 12/20/2015   Pilonidal cyst 02/08/2013    ONSET DATE: 05/01/2023  REFERRING DIAG: I63.9 (ICD-10-CM) - Cerebrovascular accident (CVA), 2020  THERAPY DIAG:  Chronic left shoulder pain  Muscle weakness (generalized)  Other lack of coordination  Hemiplegia and hemiparesis following cerebral infarction affecting left non-dominant side (HCC)  Rationale for Evaluation and Treatment: Rehabilitation  SUBJECTIVE:   SUBJECTIVE STATEMENT:  Patient reports that he still has pain in his left shoulder but the table slides help it feel better.  He also continues to work on shoulder stretches in the bed.  He mentioned that he would like to be able to hold things better with his left hand.   Pt accompanied by: self  PERTINENT HISTORY:   Patient called the doctor's office stating he needs a new brace for his left hand. He said that therapy gave him his last one about 2 or 3 years ago.  Presented 07/19/2019 with L sided weakness with diagnosis of Right middle cerebral artery stroke.  Patient underwent decompressive right hemicraniectomy with abdominal flap implant 07/19/2019 Inpatient Rehab: 09/06/2019 - 10/07/2019  Multiple outpatient therapy sessions with most recent being aquatic therapy at the beginning of 2024.  PRECAUTIONS: Fall  WEIGHT BEARING RESTRICTIONS: No  PAIN:  Are you  having pain? Yes: NPRS scale: about 5/10 Pain location: L shoulder Pain description: sore Aggravating factors: arm dangling Relieving factors: resting it  FALLS: Has patient fallen in last 6 months? No  LIVING ENVIRONMENT:   Lives with: lives with spouse & children (21 yo daughter/14 yo son)  Patient has nurse that helps with bathing M-F x 3 hours/day.  Shower is upstairs so he generally sponge bathes.  Lives in: House Stairs: yes 3 steps to enter with rail on the R side entering; full flight of stairs (17 steps) indoors to get upstairs to shower but no longer goes up there s/p previous fall nearly 2 years go Has following equipment at home: Wheelchair (manual) and hospital bed; [facility] [employer] for bathing at bedside but goes to the bathroom to toilet; quad cane  PLOF: Independent with basic ADLs, Independent with household mobility without device, Independent with transfers, Needs assistance with ADLs, and Needs assistance with homemaking Patient spends more time in  WC at home due to moving around quicker but does walk around where/when he can daily with his quad cane  PATIENT GOALS: Replace/repair hand splint.  Put on his L shoe (with AFO).  OBJECTIVE:   HAND DOMINANCE: Right  ADLs: Overall ADLs: Needs assistance with LB ADLs and has a 'nurse' 5x/week x 3 hours Transfers/ambulation related to ADLs: MI with quad cane, uses WC to get around quicker at home Eating: Spouse or nurse cuts his food  Grooming: Patient shaves himself with clippers ( but noted to have missed the back of his head at OT eval today) UB Dressing: Shirt sometimes gets caught on his shoulder but generally can manage a t shirt himself LB Dressing: Can do underwear and pants & R sock/shoe but can't do L sock or shoe Toileting: MI with quad cane Bathing: Can bathe UB and legs but can't wash his feet and R arm; has brush to clean his back, sits on BSC at bedside to wash his bottom  Tub Shower transfers: NA Equipment: bed side  commode, Reacher, and Long handled sponge  IADLs: Shopping: Spouse Light housekeeping: Spouse/Nurse Meal Prep: Did not ask Community mobility: Drove himself to therapy today Medication management: Did not ask Financial management: Did not ask Handwriting: 100% legible - R handed  MOBILITY STATUS: Needs Assist: unable to ambulate quickly for toielitng etc, Hx of falls, difficulty with turns, difficulty carrying objections with ambulation, and quad cane  POSTURE COMMENTS:  weight shift right Sitting balance:  WFL  ACTIVITY TOLERANCE: Activity tolerance: Seated good, standing/gait limited  FUNCTIONAL OUTCOME MEASURES: TBD  UPPER EXTREMITY ROM:    Active ROM Right eval Left eval  Shoulder flexion WFL AROM poor except for hand grip.  PROM Good elbow and distal but limited with L shoulder due to subluxation and discomfort.  Shoulder abduction    Shoulder adduction    Shoulder extension    Shoulder internal rotation    Shoulder external rotation    Elbow flexion    Elbow extension    Wrist flexion    Wrist extension    Wrist ulnar deviation    Wrist radial deviation    Wrist pronation    Wrist supination    (Blank rows = not tested)  UPPER EXTREMITY MMT:     MMT Right eval Left eval  Shoulder flexion 5/5 0-1/5 - minimal flickers of muscle movement noted except for digits ie) able to squeeze functionally but can not open digits.   Digital flexion 3/5  Shoulder abduction    Shoulder adduction    Shoulder extension    Shoulder internal rotation    Shoulder external rotation    Middle trapezius    Lower trapezius    Elbow flexion    Elbow extension    Wrist flexion    Wrist extension    Wrist ulnar deviation    Wrist radial deviation    Wrist pronation    Wrist supination    (Blank rows = not tested)  HAND FUNCTION: R UE WFL; L UE Poor - can grasp item placed in hand but can not let go and can not move limb to pick up an item  COORDINATION: R UE WFL; L  UE NA/poor  SENSATION: R UE WFL; L UE impaired  EDEMA: R UE NA; L UE slight  MUSCLE TONE: LUE: Moderate, Flaccid, and Shoulder flaccid with significant shoulder subluxation > 2 fingers widths and hand gets fisted and is difficulty to open ie) at night and  then his hands hurts in the morning  COGNITION: Overall cognitive status: Impaired - Extra time needed to follow directions and answer questions at times.  VISION ASSESSMENT: Not tested  PERCEPTION: NT  PRAXIS: NT  Evaluation OBSERVATIONS: Patient is a tall black male with significant left hemiplegia with flaccid shoulder who arrived today with a custom thermoplastic resting hand splint on his left hand which was sliding off and noted to have poor straps to hold it in place.  Patient demonstrates good passive range of motion of digital extension however has no active extension of digits with home resulting and fisted hand often for patient throughout the day/night.  Patient ambulates slowly and has discomfort and left shoulder which hangs due to flaccid hemiplegia at shoulder.  He has at least a 2 finger widths shoulder subluxation but can perform digital flexion to fist his hand.   TODAY'S TREATMENT:                                                                                                                               Self Care -  Patient engaged in functional use of L UE for grasp of objects on table top ie) to hold a food container.  Demonstration provided re stabilizing L arm on table top from elbow to hand, and then positioning a cup (versus bowl) in his hand as the smaller cylinder is easier to hold.   Neuro re-education techniques used previously to help improve ROM, positioning and motor control of LUE are used to facilitate self care task related to self feeding.  Tactile, visual and verbal cues input and cues for facilitating his L hand grasp as hand over hand guidance is needed initially but by end of session, he is able  to put a metal camping cup in is hand by himself.  He was engaged in holding cup, scooping rice out to another container to simulate self feeding with patient able to empty cup x 3 trials without spilling, and all with < 10 pieces of rice in container.  It was difficult for him to get the cup in his hand on his own and so he was given strategies to increase ease ie) opening hand fully/laying hand flat and working over a soft surface ie) pillow which allowed him to get his hand semi-flat (at least fingers extended) and allowed him to push the cup into the pillow to get it inside his webspace before grasping it.  Pictures take and emailed to patient along with a video to help with carryover at home.  PATIENT EDUCATION: Education details: L UE grasp Person educated: Patient Education method: Explanation, Demonstration, Tactile cues, and Verbal cues Education comprehension: verbalized understanding, returned demonstration, verbal cues required, tactile cues required, and needs further education  HOME EXERCISE PROGRAM: 06/17/23: Table Top Slides - Access Code: [email or web address]  06/24/23: Additional Table Slides for Abduction/Scaption - same access code 07/01/23: Additional Dowel (Cane) AAROM -Flexion/abduction - same  access code 07/08/23: Additional Dowel (Cane) AAROM - Scaption - same code    GOALS: Goals reviewed with patient? Yes   LONG TERM GOALS: Target date: 08/14/23  Patient will have appropriate resting hand splint and/or roll for alternating wear schedule for L UE with good fit, stretch, comfort and positioning of L digits and wrist. Baseline: Thermoplastic splint in ill-repair.  Had previous pre-fab splint > 2-3 years ago but unable to use it comfortably.  Goal status: MET  2.  Patient will be independent with directing caregivers to apply splint and care for splint - appropriately cleaning and removing/donning cover to splint. Baseline: MI with current  thermoplastic splint. Goal status: MET  3.  Patient will be independent with HEP for L UE hemiplegia to work on shoulder approximation and comfort. Baseline: 2 finger width shoulder subluxation and pain with ambulation. Goal status: MET  4.  Patient will have information re: hemi-sling options to help with shoulder positioning for improved comfort with gait.  Baseline: Previously used sling but it pulled on his neck. Goal status: REVISED -  07/08/23 - handout provided  5.  Patient will try modified techniques, AE and positions to help with L LE dressing including sock and shoe (with AFO).  Baseline: Dependent on caregiver/spouse. Goal status: MET  6.  Patient will be aware of options for AE as needed for L hemiplegia based on areas identified as problems. Baseline: In need of new reacher. Goal status: IN PROGRESS  ASSESSMENT:  CLINICAL IMPRESSION: Session again focussed on L UE use ie) to open his L hand/digits to be able to grasp a cup with good grasp achieved to allow sustained hold of container for self feeding activity.  Patient continues to benefit from continued skilled occupational therapy services to update handouts for HEP, provide education for AE and modified techniques for use of LUE as well as techniques for ADLs as needed prior to DC when max rehab potential is met.    PERFORMANCE DEFICITS: in functional skills including ADLs, IADLs, coordination, dexterity, sensation, tone, ROM, strength, pain, muscle spasms, flexibility, Fine motor control, Gross motor control, skin integrity, and UE functional use, cognitive skills including learn and understand, and psychosocial skills including routines and behaviors.   IMPAIRMENTS: are limiting patient from ADLs, rest and sleep, leisure, and social participation.   CO-MORBIDITIES: may have co-morbidities  that affects occupational performance. Patient will benefit from skilled OT to address above impairments and improve overall  function.  REHAB POTENTIAL: Good   PLAN:  OT FREQUENCY: 1x/week  OT DURATION: additional 6 weeks   PLANNED INTERVENTIONS: self care/ADL training, therapeutic exercise, therapeutic activity, neuromuscular re-education, manual therapy, passive range of motion, splinting, patient/family education, coping strategies training, and DME and/or AE instructions  RECOMMENDED OTHER SERVICES: NA at this time  CONSULTED AND AGREED WITH PLAN OF CARE: Patient  PLAN FOR NEXT SESSION:   PRINT further HEP ideas for L UE positioning and AAROM.  Establish info binder/folder for collection of HEP and informational handouts ie) hemi Sling options for L shoulder discomfort, AE options etc.    Victorino Sparrow, OT 07/22/2023, 3:18 PM

## 2023-07-29 ENCOUNTER — Ambulatory Visit: Payer: Medicare Other | Attending: Medical | Admitting: Occupational Therapy

## 2023-07-29 DIAGNOSIS — M24542 Contracture, left hand: Secondary | ICD-10-CM | POA: Insufficient documentation

## 2023-07-29 DIAGNOSIS — G8929 Other chronic pain: Secondary | ICD-10-CM | POA: Diagnosis present

## 2023-07-29 DIAGNOSIS — I69354 Hemiplegia and hemiparesis following cerebral infarction affecting left non-dominant side: Secondary | ICD-10-CM | POA: Diagnosis present

## 2023-07-29 DIAGNOSIS — M6281 Muscle weakness (generalized): Secondary | ICD-10-CM | POA: Insufficient documentation

## 2023-07-29 DIAGNOSIS — M25512 Pain in left shoulder: Secondary | ICD-10-CM | POA: Diagnosis present

## 2023-07-29 NOTE — Therapy (Unsigned)
OUTPATIENT OCCUPATIONAL THERAPY NEURO TREATMENT   Patient Name: Matthew Black MRN: [id number] DOB:1976/04/10, 47 y.o., male Today's Date: 07/29/2023  PCP: Esperanza Richters, PA-C REFERRING PROVIDER: Horton Chin, MD  END OF SESSION:  OT End of Session - 07/29/23 1425     Visit Number 12    Number of Visits 13   + evaluation   Date for OT Re-Evaluation 08/14/23    Authorization Type Medicare A & B  20% coinsurance    Progress Note Due on Visit 16    OT Start Time 1402    OT Stop Time 1445    OT Time Calculation (min) 43 min    Equipment Utilized During Treatment UBE    Activity Tolerance Patient tolerated treatment well    Behavior During Therapy [facility] for tasks assessed/performed             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   Stroke (HCC)    RICA, R A1, R MCA occlusion 07/19/19   Past Surgical History:  Procedure Laterality Date   CRANIOPLASTY Right 06/05/2020   Procedure: CRANIOPLASTY;  Surgeon: Lisbeth Renshaw, MD;  Location: [facility] OR;  Service: Neurosurgery;  Laterality: Right;  right   CRANIOTOMY Right 07/19/2019   Procedure: RIGHT HEMI-CRANIECTOMY With implantation of skull flap to abdominal wall;  Surgeon: Lisbeth Renshaw, MD;  Location: [facility] OR;  Service: Neurosurgery;  Laterality: Right;   CYST EXCISION N/A 10/08/2016   Procedure: EXCISION OF POSTERIOR NECK CYST;  Surgeon: Berna Bue, MD;  Location: [facility] ORS;  Service: General;  Laterality: N/A;   CYSTOSCOPY/URETEROSCOPY/HOLMIUM LASER/STENT PLACEMENT Right 03/16/2020   Procedure: CYSTOSCOPY RIGHT  RETROGRADE PYELOGRAM URETEROSCOPY/HOLMIUM LASER/STENT PLACEMENT;  Surgeon: Crista Elliot, MD;  Location: [facility] ORS;  Service: Urology;  Laterality: Right;   INCISION AND DRAINAGE ABSCESS N/A 09/22/2014   Procedure: INCISION AND DRAINAGE ABSCESS POSTERIOR NECK;  Surgeon: Valarie Merino, MD;  Location: [facility] ORS;  Service: General;  Laterality: N/A;   INCISION AND DRAINAGE ABSCESS N/A 12/20/2015   Procedure: INCISION AND DRAINAGE POSTERIOR NECK MASS;  Surgeon: Darnell Level, MD;  Location: [facility];  Service: General;  Laterality: N/A;   INCISION AND DRAINAGE ABSCESS Left 07/10/2004   middle finger   IR IVC FILTER PLMT / S&I /IMG GUID/MOD SED  07/21/2019   IR RADIOLOGIST EVAL & MGMT  12/14/2019   IR RADIOLOGIST EVAL & MGMT  12/21/2020   IR VENOGRAM RENAL UNI RIGHT  07/21/2019   MASS EXCISION N/A 07/21/2017   Procedure: EXCISION OF BENIGN NECK LESION WITH LAYERED CLOSURE;  Surgeon: Glenna Fellows, MD;  Location: [facility];  Service: Plastics;  Laterality: N/A;   MASS EXCISION N/A 11/10/2017   Procedure: EXCISION BENIGN LESION OF THE NECK WITH LAYERED CLOSURE;  Surgeon: Glenna Fellows, MD;  Location: [facility];  Service: Plastics;  Laterality: N/A;   Patient Active Problem List   Diagnosis Date Noted   Hx of adenomatous colonic polyps 06/01/2023   COVID-19 virus infection 04/21/2021   Left-sided  weakness 04/21/2021   S/P craniotomy 06/05/2020   History of cranioplasty 06/05/2020   Peri-rectal abscess 02/14/2020   Abnormal CT scan, pelvis 02/14/2020   Pancytopenia (HCC) 12/02/2019   Nephrolithiasis 12/02/2019   Hydronephrosis with renal and ureteral calculus obstruction 12/02/2019   Rectal pain 12/02/2019   Hyperkalemia 12/02/2019   Reactive depression    Wound infection after surgery    Sleep disturbance    Dysphagia, post-stroke    Transaminitis    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   Cerebral abscess    Urinary tract infection without  hematuria    Altered mental status    Primary hypercoagulable state (HCC)    Acute pulmonary embolism without acute cor pulmonale (HCC)    Deep vein thrombosis (DVT) of non-extremity vein    Hypokalemia    Acute blood loss anemia    Leukocytosis    Endotracheal tube present    Acute respiratory failure with hypoxemia (HCC)    Stroke (cerebrum) (HCC) 07/19/2019   Pressure injury of skin 07/19/2019   Acute CVA (cerebrovascular accident) (HCC)    Encephalopathy    Dysphagia    Acute encephalopathy    Essential hypertension    Obesity 03/12/2016   Scalp abscess 12/20/2015   Neck abscess 12/20/2015   Pilonidal cyst 02/08/2013    ONSET DATE: 05/01/2023  REFERRING DIAG: I63.9 (ICD-10-CM) - Cerebrovascular accident (CVA), 2020  THERAPY DIAG:  Hemiplegia and hemiparesis following cerebral infarction affecting left non-dominant side (HCC)  Muscle weakness (generalized)  Chronic left shoulder pain  Contracture, left hand  Rationale for Evaluation and Treatment: Rehabilitation  SUBJECTIVE:   SUBJECTIVE STATEMENT:  Patient reports that he still has pain in his left shoulder but the table slides help it feel better.  He also continues to work on shoulder stretches in the bed.  He mentioned that he would like to be able to hold things better with his left hand.   Pt accompanied by: self  PERTINENT HISTORY:   Patient called the doctor's office stating he needs a new brace for his left hand. He said that therapy gave him his last one about 2 or 3 years ago.  Presented 07/19/2019 with L sided weakness with diagnosis of Right middle cerebral artery stroke.  Patient underwent decompressive right hemicraniectomy with abdominal flap implant 07/19/2019 Inpatient Rehab: 09/06/2019 - 10/07/2019  Multiple outpatient therapy sessions with most recent being aquatic therapy at the beginning of 2024.  PRECAUTIONS: Fall  WEIGHT BEARING RESTRICTIONS: No  PAIN:  Are you having pain?  No  FALLS: Has patient fallen in last 6 months? No  LIVING ENVIRONMENT:   Lives with: lives with spouse & children (90 yo daughter/14 yo son)  Patient has nurse that helps with bathing M-F x 3 hours/day.  Shower is upstairs so he generally sponge bathes.  Lives in: House Stairs: yes 3 steps to enter with rail on the R side entering; full flight of stairs (17 steps) indoors to get upstairs to shower but no longer goes up there s/p previous fall nearly 2 years go Has following equipment at home: Wheelchair (manual) and hospital bed; [facility] for bathing at bedside but goes to the bathroom to toilet; quad cane  PLOF: Independent with basic ADLs, Independent with household mobility without device, Independent with transfers, Needs assistance with ADLs, and Needs assistance with homemaking Patient spends more time in [facility] at home due to moving around quicker but does walk around where/when he can daily with his quad cane  PATIENT GOALS: Replace/repair hand splint.  Put on his L shoe (with AFO).  OBJECTIVE:   HAND DOMINANCE: Right  ADLs: Overall ADLs: Needs assistance with LB ADLs and has a 'nurse' 5x/week x 3 hours Transfers/ambulation related to ADLs: MI with quad cane, uses WC to get around quicker at home Eating: Spouse or nurse cuts his food  Grooming: Patient shaves himself with clippers ( but noted to have missed the back of his head at OT eval today) UB Dressing: Shirt sometimes gets caught on his shoulder but generally can manage a t shirt himself LB Dressing: Can do underwear and pants & R sock/shoe but can't do L sock or shoe Toileting: MI with quad cane Bathing: Can bathe UB and legs but can't wash his feet and R arm; has brush to clean his back, sits on BSC at bedside to wash his bottom  Tub Shower transfers: NA Equipment: bed side commode, Reacher, and Long handled sponge  IADLs: Shopping: Spouse Light housekeeping: Spouse/Nurse Meal Prep: Did not ask Community mobility: Drove  himself to therapy today Medication management: Did not ask Financial management: Did not ask Handwriting: 100% legible - R handed  MOBILITY STATUS: Needs Assist: unable to ambulate quickly for toielitng etc, Hx of falls, difficulty with turns, difficulty carrying objections with ambulation, and quad cane  POSTURE COMMENTS:  weight shift right Sitting balance:  WFL  ACTIVITY TOLERANCE: Activity tolerance: Seated good, standing/gait limited  FUNCTIONAL OUTCOME MEASURES: TBD  UPPER EXTREMITY ROM:    Active ROM Right eval Left eval  Shoulder flexion WFL AROM poor except for hand grip.  PROM Good elbow and distal but limited with L shoulder due to subluxation and discomfort.  Shoulder abduction    Shoulder adduction    Shoulder extension    Shoulder internal rotation    Shoulder external rotation    Elbow flexion    Elbow extension    Wrist flexion    Wrist extension    Wrist ulnar deviation    Wrist radial deviation    Wrist pronation    Wrist supination    (Blank rows = not tested)  UPPER EXTREMITY MMT:     MMT Right eval Left eval  Shoulder flexion 5/5 0-1/5 - minimal flickers of muscle movement noted except for digits ie) able to squeeze functionally but can not open digits.   Digital flexion 3/5  Shoulder abduction    Shoulder adduction    Shoulder extension    Shoulder internal rotation    Shoulder external rotation    Middle trapezius    Lower trapezius    Elbow flexion    Elbow extension    Wrist flexion    Wrist extension    Wrist ulnar deviation    Wrist radial deviation    Wrist pronation    Wrist supination    (Blank rows = not tested)  HAND FUNCTION: R UE WFL; L UE Poor - can grasp item placed in hand but can not let go and can not move limb to pick up an item  COORDINATION: R UE WFL; L UE NA/poor  SENSATION: R UE WFL; L UE impaired  EDEMA: R UE NA; L UE slight  MUSCLE TONE: LUE: Moderate, Flaccid, and Shoulder flaccid with  significant shoulder subluxation > 2 fingers widths and hand gets fisted and is difficulty to open ie) at night and then his hands hurts in the morning  COGNITION: Overall cognitive status: Impaired - Extra time needed to follow directions and  answer questions at times.  VISION ASSESSMENT: Not tested  PERCEPTION: NT  PRAXIS: NT  Evaluation OBSERVATIONS: Patient is a tall black male with significant left hemiplegia with flaccid shoulder who arrived today with a custom thermoplastic resting hand splint on his left hand which was sliding off and noted to have poor straps to hold it in place.  Patient demonstrates good passive range of motion of digital extension however has no active extension of digits with home resulting and fisted hand often for patient throughout the day/night.  Patient ambulates slowly and has discomfort and left shoulder which hangs due to flaccid hemiplegia at shoulder.  He has at least a 2 finger widths shoulder subluxation but can perform digital flexion to fist his hand.   TODAY'S TREATMENT:                                                                                                                               Therapeutic Exercises  PATIENT EDUCATION: Education details: L UE grasp Person educated: Patient Education method: Explanation, Demonstration, Tactile cues, and Verbal cues Education comprehension: verbalized understanding, returned demonstration, verbal cues required, tactile cues required, and needs further education  HOME EXERCISE PROGRAM: 06/17/23: Table Top Slides - Access Code: [email or web address]  06/24/23: Additional Table Slides for Abduction/Scaption - same access code 07/01/23: Additional Dowel (Cane) AAROM -Flexion/abduction - same access code 07/08/23: Additional Dowel (Cane) AAROM - Scaption - same code    GOALS: Goals reviewed with patient? Yes   LONG TERM GOALS: Target date: 08/14/23  Patient will have  appropriate resting hand splint and/or roll for alternating wear schedule for L UE with good fit, stretch, comfort and positioning of L digits and wrist. Baseline: Thermoplastic splint in ill-repair.  Had previous pre-fab splint > 2-3 years ago but unable to use it comfortably.  Goal status: MET  2.  Patient will be independent with directing caregivers to apply splint and care for splint - appropriately cleaning and removing/donning cover to splint. Baseline: MI with current thermoplastic splint. Goal status: MET  3.  Patient will be independent with HEP for L UE hemiplegia to work on shoulder approximation and comfort. Baseline: 2 finger width shoulder subluxation and pain with ambulation. Goal status: MET  4.  Patient will have information re: hemi-sling options to help with shoulder positioning for improved comfort with gait.  Baseline: Previously used sling but it pulled on his neck. Goal status: REVISED -  07/08/23 - handout provided  5.  Patient will try modified techniques, AE and positions to help with L LE dressing including sock and shoe (with AFO).  Baseline: Dependent on caregiver/spouse. Goal status: MET  6.  Patient will be aware of options for AE as needed for L hemiplegia based on areas identified as problems. Baseline: In need of new reacher. Goal status: IN PROGRESS  ASSESSMENT:  CLINICAL IMPRESSION: Session again focussed on L UE use ie) to open his L hand/digits to  be able to grasp a cup with good grasp achieved to allow sustained hold of container for self feeding activity.  Patient continues to benefit from continued skilled occupational therapy services to update handouts for HEP, provide education for AE and modified techniques for use of LUE as well as techniques for ADLs as needed prior to DC when max rehab potential is met.    PERFORMANCE DEFICITS: in functional skills including ADLs, IADLs, coordination, dexterity, sensation, tone, ROM, strength, pain, muscle  spasms, flexibility, Fine motor control, Gross motor control, skin integrity, and UE functional use, cognitive skills including learn and understand, and psychosocial skills including routines and behaviors.   IMPAIRMENTS: are limiting patient from ADLs, rest and sleep, leisure, and social participation.   CO-MORBIDITIES: may have co-morbidities  that affects occupational performance. Patient will benefit from skilled OT to address above impairments and improve overall function.  REHAB POTENTIAL: Good   PLAN:  OT FREQUENCY: 1x/week  OT DURATION: additional 6 weeks   PLANNED INTERVENTIONS: self care/ADL training, therapeutic exercise, therapeutic activity, neuromuscular re-education, manual therapy, passive range of motion, splinting, patient/family education, coping strategies training, and DME and/or AE instructions  RECOMMENDED OTHER SERVICES: NA at this time  CONSULTED AND AGREED WITH PLAN OF CARE: Patient  PLAN FOR NEXT SESSION:   PRINT further HEP ideas for L UE positioning and AAROM.  Establish info binder/folder for collection of HEP and informational handouts ie) hemi Sling options for L shoulder discomfort, AE options etc.    Victorino Sparrow, OT 07/29/2023, 5:19 PM

## 2023-08-05 ENCOUNTER — Ambulatory Visit: Payer: Medicare Other | Admitting: Occupational Therapy

## 2023-08-05 DIAGNOSIS — M24542 Contracture, left hand: Secondary | ICD-10-CM

## 2023-08-05 DIAGNOSIS — I69354 Hemiplegia and hemiparesis following cerebral infarction affecting left non-dominant side: Secondary | ICD-10-CM

## 2023-08-05 DIAGNOSIS — M6281 Muscle weakness (generalized): Secondary | ICD-10-CM

## 2023-08-05 DIAGNOSIS — G8929 Other chronic pain: Secondary | ICD-10-CM

## 2023-08-05 NOTE — Therapy (Unsigned)
OUTPATIENT OCCUPATIONAL THERAPY NEURO TREATMENT   Patient Name: Matthew Black MRN: [id number] DOB:15-Apr-1976, 47 y.o., male Today's Date: 08/05/2023  PCP: Esperanza Richters, PA-C REFERRING PROVIDER: Horton Chin, MD  END OF SESSION:  OT End of Session - 08/05/23 1401     Visit Number 13    Number of Visits 14   + evaluation   Date for OT Re-Evaluation 08/14/23    Authorization Type Medicare A & B  20% coinsurance    Progress Note Due on Visit 16    OT Start Time 1402    OT Stop Time 1445    OT Time Calculation (min) 43 min    Equipment Utilized During Treatment UBE    Activity Tolerance Patient tolerated treatment well    Behavior During Therapy [facility] for tasks assessed/performed             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   Stroke (HCC)    RICA, R A1, R MCA occlusion 07/19/19   Past Surgical History:  Procedure Laterality Date   CRANIOPLASTY Right 06/05/2020   Procedure: CRANIOPLASTY;  Surgeon: Lisbeth Renshaw, MD;  Location: [facility] OR;  Service: Neurosurgery;  Laterality: Right;  right   CRANIOTOMY Right 07/19/2019   Procedure: RIGHT HEMI-CRANIECTOMY With implantation of skull flap to abdominal wall;  Surgeon: Lisbeth Renshaw, MD;  Location: [facility] OR;  Service: Neurosurgery;  Laterality: Right;   CYST EXCISION N/A 10/08/2016   Procedure: EXCISION OF POSTERIOR NECK CYST;  Surgeon: Berna Bue, MD;  Location: [facility] ORS;  Service: General;  Laterality: N/A;   CYSTOSCOPY/URETEROSCOPY/HOLMIUM LASER/STENT PLACEMENT Right 03/16/2020   Procedure: CYSTOSCOPY RIGHT  RETROGRADE PYELOGRAM URETEROSCOPY/HOLMIUM LASER/STENT PLACEMENT;  Surgeon: Crista Elliot, MD;  Location: [facility] ORS;  Service: Urology;  Laterality: Right;   INCISION AND DRAINAGE ABSCESS N/A 09/22/2014   Procedure: INCISION AND DRAINAGE ABSCESS POSTERIOR NECK;  Surgeon: Valarie Merino, MD;  Location: [facility] ORS;  Service: General;  Laterality: N/A;   INCISION AND DRAINAGE ABSCESS N/A 12/20/2015   Procedure: INCISION AND DRAINAGE POSTERIOR NECK MASS;  Surgeon: Darnell Level, MD;  Location: [facility];  Service: General;  Laterality: N/A;   INCISION AND DRAINAGE ABSCESS Left 07/10/2004   middle finger   IR IVC FILTER PLMT / S&I /IMG GUID/MOD SED  07/21/2019   IR RADIOLOGIST EVAL & MGMT  12/14/2019   IR RADIOLOGIST EVAL & MGMT  12/21/2020   IR VENOGRAM RENAL UNI RIGHT  07/21/2019   MASS EXCISION N/A 07/21/2017   Procedure: EXCISION OF BENIGN NECK LESION WITH LAYERED CLOSURE;  Surgeon: Glenna Fellows, MD;  Location: [facility];  Service: Plastics;  Laterality: N/A;   MASS EXCISION N/A 11/10/2017   Procedure: EXCISION BENIGN LESION OF THE NECK WITH LAYERED CLOSURE;  Surgeon: Glenna Fellows, MD;  Location: [facility];  Service: Plastics;  Laterality: N/A;   Patient Active Problem List   Diagnosis Date Noted   Hx of adenomatous colonic polyps 06/01/2023   COVID-19 virus infection 04/21/2021   Left-sided  weakness 04/21/2021   S/P craniotomy 06/05/2020   History of cranioplasty 06/05/2020   Peri-rectal abscess 02/14/2020   Abnormal CT scan, pelvis 02/14/2020   Pancytopenia (HCC) 12/02/2019   Nephrolithiasis 12/02/2019   Hydronephrosis with renal and ureteral calculus obstruction 12/02/2019   Rectal pain 12/02/2019   Hyperkalemia 12/02/2019   Reactive depression    Wound infection after surgery    Sleep disturbance    Dysphagia, post-stroke    Transaminitis    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   Cerebral abscess    Urinary tract infection without  hematuria    Altered mental status    Primary hypercoagulable state (HCC)    Acute pulmonary embolism without acute cor pulmonale (HCC)    Deep vein thrombosis (DVT) of non-extremity vein    Hypokalemia    Acute blood loss anemia    Leukocytosis    Endotracheal tube present    Acute respiratory failure with hypoxemia (HCC)    Stroke (cerebrum) (HCC) 07/19/2019   Pressure injury of skin 07/19/2019   Acute CVA (cerebrovascular accident) (HCC)    Encephalopathy    Dysphagia    Acute encephalopathy    Essential hypertension    Obesity 03/12/2016   Scalp abscess 12/20/2015   Neck abscess 12/20/2015   Pilonidal cyst 02/08/2013    ONSET DATE: 05/01/2023  REFERRING DIAG: I63.9 (ICD-10-CM) - Cerebrovascular accident (CVA), 2020  THERAPY DIAG:  Chronic left shoulder pain  Muscle weakness (generalized)  Hemiplegia and hemiparesis following cerebral infarction affecting left non-dominant side (HCC)  Contracture, left hand  Rationale for Evaluation and Treatment: Rehabilitation  SUBJECTIVE:   SUBJECTIVE STATEMENT:  Patient reported that he didn't have pain in his left shoulder again today upon arrival.  He reports calling different [employer] to see if they had arm bikes without success.    Pt accompanied by: self  PERTINENT HISTORY:   Patient called the doctor's office stating he needs a new brace for his left hand. He said that therapy gave him his last one about 2 or 3 years ago.  Presented 07/19/2019 with L sided weakness with diagnosis of Right middle cerebral artery stroke.  Patient underwent decompressive right hemicraniectomy with abdominal flap implant 07/19/2019 Inpatient Rehab: 09/06/2019 - 10/07/2019  Multiple outpatient therapy sessions with most recent being aquatic therapy at the beginning of 2024.  PRECAUTIONS: Fall  WEIGHT BEARING RESTRICTIONS: No  PAIN:  Are you having pain? No  FALLS: Has patient fallen in last 6 months? No  LIVING ENVIRONMENT:   Lives  with: lives with spouse & children (59 yo daughter/14 yo son)  Patient has nurse that helps with bathing M-F x 3 hours/day.  Shower is upstairs so he generally sponge bathes.  Lives in: House Stairs: yes 3 steps to enter with rail on the R side entering; full flight of stairs (17 steps) indoors to get upstairs to shower but no longer goes up there s/p previous fall nearly 2 years go Has following equipment at home: Wheelchair (manual) and hospital bed; BSC for bathing at bedside but goes to the bathroom to toilet; quad cane  PLOF: Independent with basic ADLs, Independent with household mobility without device, Independent with transfers, Needs assistance with ADLs, and Needs assistance with homemaking Patient spends more time in [facility] at home due to moving around quicker but does walk around where/when he can daily with his quad cane  PATIENT GOALS: Replace/repair hand splint.  Put on his L shoe (with AFO).  OBJECTIVE:   HAND  DOMINANCE: Right  ADLs: Overall ADLs: Needs assistance with LB ADLs and has a 'nurse' 5x/week x 3 hours Transfers/ambulation related to ADLs: MI with quad cane, uses WC to get around quicker at home Eating: Spouse or nurse cuts his food  Grooming: Patient shaves himself with clippers ( but noted to have missed the back of his head at OT eval today) UB Dressing: Shirt sometimes gets caught on his shoulder but generally can manage a t shirt himself LB Dressing: Can do underwear and pants & R sock/shoe but can't do L sock or shoe Toileting: MI with quad cane Bathing: Can bathe UB and legs but can't wash his feet and R arm; has brush to clean his back, sits on BSC at bedside to wash his bottom  Tub Shower transfers: NA Equipment: bed side commode, Reacher, and Long handled sponge  IADLs: Shopping: Spouse Light housekeeping: Spouse/Nurse Meal Prep: Did not ask Community mobility: Drove himself to therapy today Medication management: Did not ask Financial management: Did  not ask Handwriting: 100% legible - R handed  MOBILITY STATUS: Needs Assist: unable to ambulate quickly for toielitng etc, Hx of falls, difficulty with turns, difficulty carrying objections with ambulation, and quad cane  POSTURE COMMENTS:  weight shift right Sitting balance:  WFL  ACTIVITY TOLERANCE: Activity tolerance: Seated good, standing/gait limited  FUNCTIONAL OUTCOME MEASURES: TBD  UPPER EXTREMITY ROM:    Active ROM Right eval Left eval  Shoulder flexion WFL AROM poor except for hand grip.  PROM Good elbow and distal but limited with L shoulder due to subluxation and discomfort.  Shoulder abduction    Shoulder adduction    Shoulder extension    Shoulder internal rotation    Shoulder external rotation    Elbow flexion    Elbow extension    Wrist flexion    Wrist extension    Wrist ulnar deviation    Wrist radial deviation    Wrist pronation    Wrist supination    (Blank rows = not tested)  UPPER EXTREMITY MMT:     MMT Right eval Left eval  Shoulder flexion 5/5 0-1/5 - minimal flickers of muscle movement noted except for digits ie) able to squeeze functionally but can not open digits.   Digital flexion 3/5  Shoulder abduction    Shoulder adduction    Shoulder extension    Shoulder internal rotation    Shoulder external rotation    Middle trapezius    Lower trapezius    Elbow flexion    Elbow extension    Wrist flexion    Wrist extension    Wrist ulnar deviation    Wrist radial deviation    Wrist pronation    Wrist supination    (Blank rows = not tested)  HAND FUNCTION: R UE WFL; L UE Poor - can grasp item placed in hand but can not let go and can not move limb to pick up an item  COORDINATION: R UE WFL; L UE NA/poor  SENSATION: R UE WFL; L UE impaired  EDEMA: R UE NA; L UE slight  MUSCLE TONE: LUE: Moderate, Flaccid, and Shoulder flaccid with significant shoulder subluxation > 2 fingers widths and hand gets fisted and is difficulty to  open ie) at night and then his hands hurts in the morning  COGNITION: Overall cognitive status: Impaired - Extra time needed to follow directions and answer questions at times.  VISION ASSESSMENT: Not tested  PERCEPTION: NT  PRAXIS: NT  Evaluation OBSERVATIONS:  Patient is a tall black male with significant left hemiplegia with flaccid shoulder who arrived today with a custom thermoplastic resting hand splint on his left hand which was sliding off and noted to have poor straps to hold it in place.  Patient demonstrates good passive range of motion of digital extension however has no active extension of digits with home resulting and fisted hand often for patient throughout the day/night.  Patient ambulates slowly and has discomfort and left shoulder which hangs due to flaccid hemiplegia at shoulder.  He has at least a 2 finger widths shoulder subluxation but can perform digital flexion to fist his hand.   TODAY'S TREATMENT:                                                                                                                               Therapeutic Exercises  Pt completed arm bike in sitting with distance of .38 of a mile completed in only 8.5 minutes today compared to 15 minutes last week.  Over 15 minutes he travelled .63 of a mile. Able to increase resistance to 2.0 for endurance, ROM, and strengthening of affected extremity. Pt had difficulty alternating direction of pedaling ie) backwards is more uncomfortable in L shoulder and therefore forward motion continued.  Utilized Active Hand grip on L hand for consistent grasp maintenance with repositioning assistance only 1x to prevent his hand from sliding down the handle.  Patient had improved speed s/p manual techniques around shoulder to loosen his L side and allow more consistent forward motion.    Patient also worked with Active Hand with over the door pulley hooked over table edge with guidance to move L hand over table edge,  encouragement to move slow and steady 10 reps 5 sets working on L AAROM with assistance of R hand pulling the opposite handle.  OTR shorted the rope a couple of times to have him stretch his L arm out straighter with some discomfort near the fully straight position. Instructed to be comfortable when working to get elbow straighter and to keep his head up and shoulders as relaxed as possible to maximize comfort and stretch.   PATIENT EDUCATION: Education details: Teacher, music as an option to consider at the gym Person educated: Patient Education method: Explanation, Demonstration, Tactile cues, and Verbal cues Education comprehension: verbalized understanding, returned demonstration, verbal cues required, tactile cues required, and needs further education  HOME EXERCISE PROGRAM: 06/17/23: Table Top Slides - Access Code: [email or web address]  06/24/23: Additional Table Slides for Abduction/Scaption - same access code 07/01/23: Additional Dowel (Cane) AAROM -Flexion/abduction - same access code 07/08/23: Additional Dowel (Cane) AAROM - Scaption - same code    GOALS: Goals reviewed with patient? Yes   LONG TERM GOALS: Target date: 08/14/23  Patient will have appropriate resting hand splint and/or roll for alternating wear schedule for L UE with good fit, stretch, comfort and positioning of L digits and wrist. Baseline:  Thermoplastic splint in ill-repair.  Had previous pre-fab splint > 2-3 years ago but unable to use it comfortably.  Goal status: MET  2.  Patient will be independent with directing caregivers to apply splint and care for splint - appropriately cleaning and removing/donning cover to splint. Baseline: MI with current thermoplastic splint. Goal status: MET  3.  Patient will be independent with HEP for L UE hemiplegia to work on shoulder approximation and comfort. Baseline: 2 finger width shoulder subluxation and pain with ambulation. Goal status: MET  4.   Patient will have information re: hemi-sling options to help with shoulder positioning for improved comfort with gait.  Baseline: Previously used sling but it pulled on his neck. Goal status: MET -  07/08/23 - handout provided  5.  Patient will try modified techniques, AE and positions to help with L LE dressing including sock and shoe (with AFO).  Baseline: Dependent on caregiver/spouse. Goal status: MET  6.  Patient will be aware of options for AE as needed for L hemiplegia based on areas identified as problems. Baseline: In need of new reacher. Goal status: IN PROGRESS  ASSESSMENT:  CLINICAL IMPRESSION: Session again focussed on L UE ROM for HEP ideas with good success using Sci Fit machine for longer distance in same amount of time as last week with Active Hand assist in place for L grip.  Patient continues to benefit from continued skilled occupational therapy services to update handouts for HEP, provide education for AE and modified techniques for use of LUE as well as techniques for ADLs as needed with DC set next week.    PERFORMANCE DEFICITS: in functional skills including ADLs, IADLs, coordination, dexterity, sensation, tone, ROM, strength, pain, muscle spasms, flexibility, Fine motor control, Gross motor control, skin integrity, and UE functional use, cognitive skills including learn and understand, and psychosocial skills including routines and behaviors.   IMPAIRMENTS: are limiting patient from ADLs, rest and sleep, leisure, and social participation.   CO-MORBIDITIES: may have co-morbidities  that affects occupational performance. Patient will benefit from skilled OT to address above impairments and improve overall function.  REHAB POTENTIAL: Good   PLAN:  OT FREQUENCY: 1x/week  OT DURATION: additional 6 weeks   PLANNED INTERVENTIONS: self care/ADL training, therapeutic exercise, therapeutic activity, neuromuscular re-education, manual therapy, passive range of motion,  splinting, patient/family education, coping strategies training, and DME and/or AE instructions  RECOMMENDED OTHER SERVICES: NA at this time  CONSULTED AND AGREED WITH PLAN OF CARE: Patient  PLAN FOR NEXT SESSION:   PRINT further HEP ideas for L UE positioning and AAROM.  Review Goals for Discharge  Victorino Sparrow, OT 08/05/2023, 5:08 PM

## 2023-08-12 ENCOUNTER — Ambulatory Visit: Payer: Medicare Other | Admitting: Occupational Therapy

## 2023-08-12 DIAGNOSIS — G8929 Other chronic pain: Secondary | ICD-10-CM

## 2023-08-12 DIAGNOSIS — I69354 Hemiplegia and hemiparesis following cerebral infarction affecting left non-dominant side: Secondary | ICD-10-CM

## 2023-08-12 DIAGNOSIS — M6281 Muscle weakness (generalized): Secondary | ICD-10-CM

## 2023-08-12 NOTE — Therapy (Signed)
OUTPATIENT OCCUPATIONAL THERAPY NEURO TREATMENT AND DISCHARGE NOTE  Patient Name: Matthew Black MRN: [id number] DOB:01-28-1976, 47 y.o., male Today's Date: 08/12/2023  PCP: Esperanza Richters, PA-C REFERRING PROVIDER: Horton Chin, MD  END OF SESSION:  OT End of Session - 08/12/23 1411     Visit Number 14    Number of Visits 14   + evaluation   Date for OT Re-Evaluation 08/14/23    Authorization Type Medicare A & B  20% coinsurance    Progress Note Due on Visit 16    OT Start Time 1405    OT Stop Time 1445    OT Time Calculation (min) 40 min    Equipment Utilized During Treatment UBE    Activity Tolerance Patient tolerated treatment well    Behavior During Therapy [facility] for tasks assessed/performed             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   Stroke (HCC)    RICA, R A1, R MCA occlusion 07/19/19   Past Surgical History:  Procedure Laterality Date   CRANIOPLASTY Right 06/05/2020   Procedure: CRANIOPLASTY;  Surgeon: Lisbeth Renshaw, MD;  Location: [facility] OR;  Service: Neurosurgery;  Laterality: Right;  right   CRANIOTOMY Right 07/19/2019   Procedure: RIGHT HEMI-CRANIECTOMY With implantation of skull flap to abdominal wall;  Surgeon: Lisbeth Renshaw, MD;  Location: [facility] OR;  Service: Neurosurgery;  Laterality: Right;   CYST EXCISION N/A 10/08/2016   Procedure: EXCISION OF POSTERIOR NECK CYST;  Surgeon: Berna Bue, MD;  Location: [facility] ORS;  Service: General;  Laterality: N/A;   CYSTOSCOPY/URETEROSCOPY/HOLMIUM LASER/STENT PLACEMENT Right 03/16/2020   Procedure:  CYSTOSCOPY RIGHT RETROGRADE PYELOGRAM URETEROSCOPY/HOLMIUM LASER/STENT PLACEMENT;  Surgeon: Crista Elliot, MD;  Location: [facility] ORS;  Service: Urology;  Laterality: Right;   INCISION AND DRAINAGE ABSCESS N/A 09/22/2014   Procedure: INCISION AND DRAINAGE ABSCESS POSTERIOR NECK;  Surgeon: Valarie Merino, MD;  Location: [facility] ORS;  Service: General;  Laterality: N/A;   INCISION AND DRAINAGE ABSCESS N/A 12/20/2015   Procedure: INCISION AND DRAINAGE POSTERIOR NECK MASS;  Surgeon: Darnell Level, MD;  Location: [facility] ORS;  Service: General;  Laterality: N/A;   INCISION AND DRAINAGE ABSCESS Left 07/10/2004   middle finger   IR IVC FILTER PLMT / S&I /IMG GUID/MOD SED  07/21/2019   IR RADIOLOGIST EVAL & MGMT  12/14/2019   IR RADIOLOGIST EVAL & MGMT  12/21/2020   IR VENOGRAM RENAL UNI RIGHT  07/21/2019   MASS EXCISION N/A 07/21/2017   Procedure: EXCISION OF BENIGN NECK LESION WITH LAYERED CLOSURE;  Surgeon: Glenna Fellows, MD;  Location: [facility];  Service: Plastics;  Laterality: N/A;   MASS EXCISION N/A 11/10/2017   Procedure: EXCISION BENIGN LESION OF THE NECK WITH LAYERED CLOSURE;  Surgeon: Glenna Fellows, MD;  Location: [facility];  Service: Plastics;  Laterality: N/A;   Patient Active Problem List   Diagnosis Date Noted   Hx of adenomatous colonic polyps 06/01/2023   COVID-19 virus infection 04/21/2021  Left-sided weakness 04/21/2021   S/P craniotomy 06/05/2020   History of cranioplasty 06/05/2020   Peri-rectal abscess 02/14/2020   Abnormal CT scan, pelvis 02/14/2020   Pancytopenia (HCC) 12/02/2019   Nephrolithiasis 12/02/2019   Hydronephrosis with renal and ureteral calculus obstruction 12/02/2019   Rectal pain 12/02/2019   Hyperkalemia 12/02/2019   Reactive depression    Wound infection after surgery    Sleep disturbance    Dysphagia, post-stroke    Transaminitis    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   Cerebral abscess    Urinary tract  infection without hematuria    Altered mental status    Primary hypercoagulable state (HCC)    Acute pulmonary embolism without acute cor pulmonale (HCC)    Deep vein thrombosis (DVT) of non-extremity vein    Hypokalemia    Acute blood loss anemia    Leukocytosis    Endotracheal tube present    Acute respiratory failure with hypoxemia (HCC)    Stroke (cerebrum) (HCC) 07/19/2019   Pressure injury of skin 07/19/2019   Acute CVA (cerebrovascular accident) (HCC)    Encephalopathy    Dysphagia    Acute encephalopathy    Essential hypertension    Obesity 03/12/2016   Scalp abscess 12/20/2015   Neck abscess 12/20/2015   Pilonidal cyst 02/08/2013    ONSET DATE: 05/01/2023  REFERRING DIAG: I63.9 (ICD-10-CM) - Cerebrovascular accident (CVA), 2020  THERAPY DIAG:  Hemiplegia and hemiparesis following cerebral infarction affecting left non-dominant side (HCC)  Chronic left shoulder pain  Muscle weakness (generalized)  Rationale for Evaluation and Treatment: Rehabilitation  SUBJECTIVE:   SUBJECTIVE STATEMENT:  Patient reported that he hasn't been having pain in his shoulder recently.  He went to the [facility] support groupl last week and really enjoyed the information and fellowship.  He is aware of his discharge set from OT today.  Pt accompanied by: self  PERTINENT HISTORY:   Patient called the doctor's office stating he needs a new brace for his left hand. He said that therapy gave him his last one about 2 or 3 years ago.  Presented 07/19/2019 with L sided weakness with diagnosis of Right middle cerebral artery stroke.  Patient underwent decompressive right hemicraniectomy with abdominal flap implant 07/19/2019 Inpatient Rehab: 09/06/2019 - 10/07/2019  Multiple outpatient therapy sessions with most recent being aquatic therapy at the beginning of 2024.  PRECAUTIONS: Fall  WEIGHT BEARING RESTRICTIONS: No  PAIN:  Are you having pain? No  FALLS: Has patient fallen in last 6  months? No  LIVING ENVIRONMENT:   Lives with: lives with spouse & children (18 yo daughter/14 yo son)  Patient has nurse that helps with bathing M-F x 3 hours/day.  Shower is upstairs so he generally sponge bathes.  Lives in: House Stairs: yes 3 steps to enter with rail on the R side entering; full flight of stairs (17 steps) indoors to get upstairs to shower but no longer goes up there s/p previous fall nearly 2 years go Has following equipment at home: Wheelchair (manual) and hospital bed; BSC for bathing at bedside but goes to the bathroom to toilet; quad cane  PLOF: Independent with basic ADLs, Independent with household mobility without device, Independent with transfers, Needs assistance with ADLs, and Needs assistance with homemaking Patient spends more time in [facility] at home due to moving around quicker but does walk around where/when he can daily with his quad cane  PATIENT GOALS: Replace/repair hand splint.  Put on his L shoe (with AFO).  OBJECTIVE:   HAND DOMINANCE: Right  ADLs: Overall ADLs: Needs assistance with LB ADLs and has a 'nurse' 5x/week x 3 hours Transfers/ambulation related to ADLs: MI with quad cane, uses WC to get around quicker at home Eating: Spouse or nurse cuts his food  Grooming: Patient shaves himself with clippers ( but noted to have missed the back of his head at OT eval today) UB Dressing: Shirt sometimes gets caught on his shoulder but generally can manage a t shirt himself LB Dressing: Can do underwear and pants & R sock/shoe but can't do L sock or shoe Toileting: MI with quad cane Bathing: Can bathe UB and legs but can't wash his feet and R arm; has brush to clean his back, sits on BSC at bedside to wash his bottom  Tub Shower transfers: NA Equipment: bed side commode, Reacher, and Long handled sponge  IADLs: Shopping: Spouse Light housekeeping: Spouse/Nurse Meal Prep: Did not ask Community mobility: Drove himself to therapy today Medication  management: Did not ask Financial management: Did not ask Handwriting: 100% legible - R handed  MOBILITY STATUS: Needs Assist: unable to ambulate quickly for toielitng etc, Hx of falls, difficulty with turns, difficulty carrying objections with ambulation, and quad cane  POSTURE COMMENTS:  weight shift right Sitting balance:  WFL  ACTIVITY TOLERANCE: Activity tolerance: Seated good, standing/gait limited  FUNCTIONAL OUTCOME MEASURES: TBD  UPPER EXTREMITY ROM:    Active ROM Right eval Left eval  Shoulder flexion WFL AROM poor except for hand grip.  PROM Good elbow and distal but limited with L shoulder due to subluxation and discomfort.  Shoulder abduction    Shoulder adduction    Shoulder extension    Shoulder internal rotation    Shoulder external rotation    Elbow flexion    Elbow extension    Wrist flexion    Wrist extension    Wrist ulnar deviation    Wrist radial deviation    Wrist pronation    Wrist supination    (Blank rows = not tested)  UPPER EXTREMITY MMT:     MMT Right eval Left eval  Shoulder flexion 5/5 0-1/5 - minimal flickers of muscle movement noted except for digits ie) able to squeeze functionally but can not open digits.   Digital flexion 3/5  Shoulder abduction    Shoulder adduction    Shoulder extension    Shoulder internal rotation    Shoulder external rotation    Middle trapezius    Lower trapezius    Elbow flexion    Elbow extension    Wrist flexion    Wrist extension    Wrist ulnar deviation    Wrist radial deviation    Wrist pronation    Wrist supination    (Blank rows = not tested)  HAND FUNCTION: R UE WFL; L UE Poor - can grasp item placed in hand but can not let go and can not move limb to pick up an item  COORDINATION: R UE WFL; L UE NA/poor  SENSATION: R UE WFL; L UE impaired  EDEMA: R UE NA; L UE slight  MUSCLE TONE: LUE: Moderate, Flaccid, and Shoulder flaccid with significant shoulder subluxation > 2 fingers  widths and hand gets fisted and is difficulty to open ie) at night and then his hands hurts in the morning  COGNITION: Overall cognitive status: Impaired - Extra time needed to follow directions and answer questions at times.  VISION ASSESSMENT: Not tested  PERCEPTION: NT  PRAXIS:  NT  Evaluation OBSERVATIONS: Patient is a tall black male with significant left hemiplegia with flaccid shoulder who arrived today with a custom thermoplastic resting hand splint on his left hand which was sliding off and noted to have poor straps to hold it in place.  Patient demonstrates good passive range of motion of digital extension however has no active extension of digits with home resulting and fisted hand often for patient throughout the day/night.  Patient ambulates slowly and has discomfort and left shoulder which hangs due to flaccid hemiplegia at shoulder.  He has at least a 2 finger widths shoulder subluxation but can perform digital flexion to fist his hand.   TODAY'S TREATMENT:                                                                                                                               Therapeutic Exercises  Pt completed arm bike in sitting with distance of .38 of a mile completed in only 7:20 minutes today compared to 8.5 minutes last week.  Over 12 minutes he travelled .63 of a mile (total distance last week was .63 in 15 min). Today ablew to complete .79 miles in 15 minutes today with increased resistance to 2.0 for endurance, ROM, and strengthening of affected extremity.  Utilized Active Hand grip on L hand for consistent grasp maintenance with no repositioning assistance today to prevent his hand from sliding down the handle.  Patient had good shoulder tone/relaxation of L shoulder without manual massage today and was able to increase distance travelled x 2 in 15 minutes compared to 2 weeks ago with good consistent forward motion.    Patient engaged in 2 additional table top L UE  ROM activities as previously performed but printed as part of his HEP today.   - Seated Elbow Flexion Shoulder Internal Rotation AAROM at Table with Towel  - 1 x daily - 5-10 reps - Pt encouraged to ensure it is his arm that his moving and not his trunk. - Seated Elbow Extension and Shoulder External Rotation AAROM at Table with Towel  - 1 x daily - 5-10 reps - Pt encouraged to to use R UE to help with pushing his arm back across the table top due limited extensor muscle activation in L UE.   PATIENT EDUCATION: Education details: HEP Person educated: Patient Education method: Explanation, Demonstration, Actor cues, and Verbal cues Education comprehension: verbalized understanding and returned demonstration  HOME EXERCISE PROGRAM: 06/17/23: Table Top Slides - Access Code: [email or web address]  06/24/23: Additional Table Slides for Abduction/Scaption - same access code 07/01/23: Additional Dowel (Cane) AAROM -Flexion/abduction - same access code 07/08/23: Additional Dowel (Cane) AAROM - Scaption - same code 08/12/23: Additional Table top arm slides - same code  GOALS: Goals reviewed with patient? Yes   LONG TERM GOALS: Target date: 08/14/23  Patient will have appropriate resting hand splint and/or roll for alternating wear schedule for L UE with good  fit, stretch, comfort and positioning of L digits and wrist. Baseline: Thermoplastic splint in ill-repair.  Had previous pre-fab splint > 2-3 years ago but unable to use it comfortably.  Goal status: MET  2.  Patient will be independent with directing caregivers to apply splint and care for splint - appropriately cleaning and removing/donning cover to splint. Baseline: MI with current thermoplastic splint. Goal status: MET  3.  Patient will be independent with HEP for L UE hemiplegia to work on shoulder approximation and comfort. Baseline: 2 finger width shoulder subluxation and pain with ambulation. Goal status:  MET  4.  Patient will have information re: hemi-sling options to help with shoulder positioning for improved comfort with gait.  Baseline: Previously used sling but it pulled on his neck. Goal status: MET -  07/08/23 - handout provided  5.  Patient will try modified techniques, AE and positions to help with L LE dressing including sock and shoe (with AFO).  Baseline: Dependent on caregiver/spouse. Goal status: MET  6.  Patient will be aware of options for AE as needed for L hemiplegia based on areas identified as problems. Baseline: In need of new reacher. Goal status: MET  ASSESSMENT:  CLINICAL IMPRESSION: Session again focussed on L UE ROM with review of HEP ideas for carryover of L UE ROM which has substantially improved L shoulder comfort.  He has improved success using Sci Fit machine for longer distance in same amount of time as last week with Active Hand assist in place for L grip.  Patient has benefited from skilled occupational therapy services to provided splint and update HEP with education for ROM s/p Botox injections with good comfort in L shoulder noted, therefore OT discharge set for today.    PERFORMANCE DEFICITS: in functional skills including ADLs, IADLs, coordination, dexterity, sensation, tone, ROM, strength, pain, muscle spasms, flexibility, Fine motor control, Gross motor control, skin integrity, and UE functional use, cognitive skills including learn and understand, and psychosocial skills including routines and behaviors.   IMPAIRMENTS: are limiting patient from ADLs, rest and sleep, leisure, and social participation.   CO-MORBIDITIES: may have co-morbidities  that affects occupational performance. Patient will benefit from skilled OT to address above impairments and improve overall function.  REHAB POTENTIAL: Good   PLAN:  OCCUPATIONAL THERAPY DISCHARGE SUMMARY  Visits from Start of Care: 14  Current functional level related to goals / functional  outcomes: Pt has met all goals (6/6) to satisfactory levels and is pleased with outcomes.   Remaining deficits: Pt has chronic functional LUE deficits which he has appropriate splint and HEP to manage.   Education / Equipment: Pt has all needed materials and education. Pt understands how to continue on with self-management. See tx notes for more details.   Patient agrees to discharge due to max benefits received from outpatient occupational therapy at this time.   Victorino Sparrow, OT 08/12/2023, 2:57 PM

## 2023-08-12 NOTE — Patient Instructions (Signed)
Additional Exercises added to program and previous activities reviewed for OT DC  Access Code: [email or web address] URL: [email or web address] Date: 08/12/2023 Prepared by: Amada Kingfisher  Reviewed Exercises - Seated Shoulder Flexion Towel Slide at Table Top  - 2 x daily - 10 reps - Seated Shoulder Abduction Towel Slide at Table Top  - 2 x daily - 5-10 reps - Seated Shoulder Scaption Slide at Table Top with Forearm in Neutral  - 2 x daily - 10 reps - Seated Shoulder Flexion Extension AAROM with Dowel into Wall  - 1 x daily - 5-10 reps - Seated Shoulder Abduction AAROM with Dowel  - 1 x daily - 5-10 reps - Shoulder Scaption AAROM with Dowel  - 1 x daily - 5-10 reps - Seated Gripping Towel  - 1 x daily - 5-10 reps  New Exercises - Seated Elbow Flexion Shoulder Internal Rotation AAROM at Table with Towel  - 1 x daily - 5-10 reps - Seated Elbow Extension and Shoulder External Rotation AAROM at Table with Towel  - 1 x daily - 5-10 reps

## 2023-08-17 MED ORDER — ATORVASTATIN CALCIUM 10 MG PO TABS: 10.0000 mg | ORAL_TABLET | Freq: Every day | ORAL | 3 refills | Status: DC

## 2023-08-17 NOTE — Addendum Note (Signed)
Addended by: Gwenevere Abbot on: 08/17/2023 12:32 PM   Modules accepted: Orders

## 2023-08-18 ENCOUNTER — Ambulatory Visit (HOSPITAL_COMMUNITY)
Admission: EM | Admit: 2023-08-18 | Discharge: 2023-08-18 | Disposition: A | Discharge status: Home / Self Care | Payer: Medicare Other | Attending: Internal Medicine | Admitting: Internal Medicine

## 2023-08-18 ENCOUNTER — Encounter (HOSPITAL_COMMUNITY): Payer: Self-pay | Admitting: Emergency Medicine

## 2023-08-18 DIAGNOSIS — L03011 Cellulitis of right finger: Secondary | ICD-10-CM | POA: Diagnosis not present

## 2023-08-18 DIAGNOSIS — F988 Other specified behavioral and emotional disorders with onset usually occurring in childhood and adolescence: Secondary | ICD-10-CM

## 2023-08-18 MED ORDER — AMOXICILLIN-POT CLAVULANATE 875-125 MG PO TABS: 1.0000 | ORAL_TABLET | Freq: Two times a day (BID) | ORAL | 0 refills | Status: DC

## 2023-08-18 NOTE — Discharge Instructions (Addendum)
I drained your abscess today. Take Augmentin antibiotic twice daily for the next 7 days. Soak your finger in Epsom salt to help with the swelling and pain. You may use Tylenol as needed for pain. Stop biting your nails.   If you develop any new or worsening symptoms or if your symptoms do not start to improve, please return here or follow-up with your primary care provider. If your symptoms are severe, please go to the emergency room.

## 2023-08-18 NOTE — ED Triage Notes (Signed)
Pt c/o pain and swelling around finger nail on right hand for about week.

## 2023-08-18 NOTE — ED Provider Notes (Signed)
[facility]    CSN: [id number] Arrival date & time: 08/18/23  1630      History   Chief Complaint Chief Complaint  Patient presents with   Hand Pain    HPI Matthew Black is a 47 y.o. male.   Matthew Black is a 47 y.o. male presenting for chief complaint of paronychia to the right middle finger at the proximal nail fold that started 1 week ago.  Paronychia has grown significantly in size and tenderness over the last 1 week.  No recent trauma/injuries to the nails but patient does report that he bites his nails frequently.  No recent antibiotic/steroid use, numbness/tingling distally to injury, or redness/streaking proximally.      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   Stroke (HCC)    RICA, R A1, R MCA occlusion 07/19/19    Patient Active Problem List   Diagnosis Date Noted   Hx of adenomatous colonic polyps 06/01/2023   COVID-19 virus infection 04/21/2021   Left-sided weakness 04/21/2021   S/P craniotomy 06/05/2020   History of cranioplasty 06/05/2020   Peri-rectal abscess 02/14/2020   Abnormal CT scan, pelvis 02/14/2020   Pancytopenia (HCC) 12/02/2019   Nephrolithiasis 12/02/2019   Hydronephrosis with renal and ureteral calculus obstruction 12/02/2019   Rectal pain 12/02/2019   Hyperkalemia 12/02/2019   Reactive depression    Wound infection after surgery    Sleep disturbance    Dysphagia, post-stroke    Transaminitis    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   Cerebral abscess    Urinary tract infection without hematuria     Altered mental status    Primary hypercoagulable state (HCC)    Acute pulmonary embolism without acute cor pulmonale (HCC)    Deep vein thrombosis (DVT) of non-extremity vein    Hypokalemia    Acute blood loss anemia    Leukocytosis    Endotracheal tube present    Acute respiratory failure with hypoxemia (HCC)    Stroke (cerebrum) (HCC) 07/19/2019   Pressure injury of skin 07/19/2019   Acute CVA (cerebrovascular accident) (HCC)    Encephalopathy    Dysphagia    Acute encephalopathy    Essential hypertension    Obesity 03/12/2016   Scalp abscess 12/20/2015   Neck abscess 12/20/2015   Pilonidal cyst 02/08/2013    Past Surgical History:  Procedure Laterality Date   CRANIOPLASTY Right 06/05/2020   Procedure: CRANIOPLASTY;  Surgeon: Lisbeth Renshaw, MD;  Location: [facility] OR;  Service: Neurosurgery;  Laterality: Right;  right   CRANIOTOMY Right 07/19/2019   Procedure: RIGHT HEMI-CRANIECTOMY With implantation of skull flap to abdominal wall;  Surgeon: Lisbeth Renshaw, MD;  Location: [facility] OR;  Service: Neurosurgery;  Laterality: Right;   CYST EXCISION N/A 10/08/2016   Procedure: EXCISION OF POSTERIOR NECK CYST;  Surgeon: Berna Bue, MD;  Location: [facility] ORS;  Service: General;  Laterality: N/A;   CYSTOSCOPY/URETEROSCOPY/HOLMIUM LASER/STENT PLACEMENT Right 03/16/2020   Procedure: CYSTOSCOPY RIGHT RETROGRADE PYELOGRAM URETEROSCOPY/HOLMIUM LASER/STENT PLACEMENT;  Surgeon:  Crista Elliot, MD;  Location: [facility]S;  Service: Urology;  Laterality: Right;   INCISION AND DRAINAGE ABSCESS N/A 09/22/2014   Procedure: INCISION AND DRAINAGE ABSCESS POSTERIOR NECK;  Surgeon: Valarie Merino, MD;  Location: [facility];  Service: General;  Laterality: N/A;   INCISION AND DRAINAGE ABSCESS N/A 12/20/2015   Procedure: INCISION AND DRAINAGE POSTERIOR NECK MASS;  Surgeon: Darnell Level, MD;  Location: [facility] OR[facility];  Service: General;  Laterality: N/A;   INCISION AND DRAINAGE ABSCESS Left 07/10/2004   middle finger    IR IVC FILTER PLMT / S&I /IMG GUID/MOD SED  07/21/2019   IR RADIOLOGIST EVAL & MGMT  12/14/2019   IR RADIOLOGIST EVAL & MGMT  12/21/2020   IR VENOGRAM RENAL UNI RIGHT  07/21/2019   MASS EXCISION N/A 07/21/2017   Procedure: EXCISION OF BENIGN NECK LESION WITH LAYERED CLOSURE;  Surgeon: Glenna Fellows, MD;  Location: [facility];  Service: Plastics;  Laterality: N/A;   MASS EXCISION N/A 11/10/2017   Procedure: EXCISION BENIGN LESION OF THE NECK WITH LAYERED CLOSURE;  Surgeon: Glenna Fellows, MD;  Location:  SURGERY CENTER;  Service: Plastics;  Laterality: N/A;       Home Medications    Prior to Admission medications   Medication Sig Start Date End Date Taking? Authorizing Provider  amoxicillin-clavulanate (AUGMENTIN) 875-125 MG tablet Take 1 tablet by mouth every 12 (twelve) hours. 08/18/23  Yes Taiven Greenley, Donavan Burnet, FNP  ASPIRIN LOW DOSE 81 MG tablet TAKE 1 TABLET (81 MG TOTAL) BY MOUTH DAILY. SWALLOW WHOLE. 04/16/23   Saguier, Ramon Dredge, PA-C  atorvastatin (LIPITOR) 10 MG tablet Take 1 tablet (10 mg total) by mouth daily. 08/17/23   Saguier, Ramon Dredge, PA-C  baclofen (LIORESAL) 10 MG tablet TAKE 1 TABLET BY MOUTH THREE TIMES A DAY 07/21/23   Raulkar, Drema Pry, MD  metoprolol tartrate (LOPRESSOR) 50 MG tablet Take 1 tablet (50 mg total) by mouth 2 (two) times daily. 10/22/22   Raulkar, Drema Pry, MD  tiZANidine (ZANAFLEX) 4 MG tablet Take 1 tablet (4 mg total) by mouth at bedtime as needed for muscle spasms. 05/22/22   Raulkar, Drema Pry, MD    Family History Family History  Problem Relation Age of Onset   Healthy Mother    Healthy Father    Clotting disorder Maternal Aunt    Clotting disorder Maternal Uncle    Clotting disorder Maternal Grandmother    Diabetes Paternal Grandfather    Colon cancer Neg Hx    Rectal cancer Neg Hx    Stomach cancer Neg Hx     Social History Social History   Tobacco Use   Smoking status: Former    Current packs/day: 0.00     Types: Cigarettes    Quit date: 11/24/2015    Years since quitting: 7.7   Smokeless tobacco: Never   Tobacco comments:       Vaping Use   Vaping status: Never Used  Substance Use Topics   Alcohol use: Yes    Comment: socially   Drug use: Never     Allergies   Hydrocodone   Review of Systems Review of Systems Per HPI  Physical Exam Triage Vital Signs ED Triage Vitals  Encounter Vitals Group     BP 08/18/23 1805 115/81     Systolic BP Percentile --      Diastolic BP Percentile --      Pulse Rate 08/18/23 1805 84     Resp 08/18/23 1805 18  Temp 08/18/23 1805 98.6 F (37 C)     Temp Source 08/18/23 1805 Oral     SpO2 08/18/23 1805 96 %     Weight --      Height --      Head Circumference --      Peak Flow --      Pain Score 08/18/23 1804 7     Pain Loc --      Pain Education --      Exclude from Growth Chart --    No data found.  Updated Vital Signs BP 115/81 (BP Location: Right Arm)   Pulse 84   Temp 98.6 F (37 C) (Oral)   Resp 18   SpO2 96%   Visual Acuity Right Eye Distance:   Left Eye Distance:   Bilateral Distance:    Right Eye Near:   Left Eye Near:    Bilateral Near:     Physical Exam Vitals and nursing note reviewed.  Constitutional:      Appearance: He is not ill-appearing or toxic-appearing.  HENT:     Head: Normocephalic and atraumatic.     Right Ear: Hearing and external ear normal.     Left Ear: Hearing and external ear normal.     Nose: Nose normal.     Mouth/Throat:     Lips: Pink.  Eyes:     General: Lids are normal. Vision grossly intact. Gaze aligned appropriately.     Extraocular Movements: Extraocular movements intact.     Conjunctiva/sclera: Conjunctivae normal.  Pulmonary:     Effort: Pulmonary effort is normal.  Musculoskeletal:     Cervical back: Neck supple.  Skin:    General: Skin is warm and dry.     Capillary Refill: Capillary refill takes less than 2 seconds.     Findings: Abscess present. No rash.      Comments: Paronychia of the proximal nail fold to the right middle finger.  See image below for further details.  Less than 2 cap refill distally to injury.  Normal ROM of finger.  Nailbed intact.  Neurological:     General: No focal deficit present.     Mental Status: He is alert and oriented to person, place, and time. Mental status is at baseline.     Cranial Nerves: No dysarthria or facial asymmetry.  Psychiatric:        Mood and Affect: Mood normal.        Speech: Speech normal.        Behavior: Behavior normal.        Thought Content: Thought content normal.        Judgment: Judgment normal.      UC Treatments / Results  Labs (all labs ordered are listed, but only abnormal results are displayed) Labs Reviewed - No data to display  EKG   Radiology No results found.  Procedures Incision and Drainage  Date/Time: 08/18/2023 7:18 PM  Performed by: Carlisle Beers, FNP Authorized by: Carlisle Beers, FNP   Consent:    Consent obtained:  Verbal   Consent given by:  Patient   Risks, benefits, and alternatives were discussed: yes     Risks discussed:  Bleeding and damage to other organs   Alternatives discussed:  No treatment Universal protocol:    Patient identity confirmed:  Verbally with patient Location:    Type:  Abscess   Size:  1cm   Location:  Upper extremity   Upper extremity location:  Finger   Finger location:  R long finger (Proximal nail fold of the right middle finger) Pre-procedure details:    Skin preparation:  Chlorhexidine with alcohol Sedation:    Sedation type:  None Anesthesia:    Anesthesia method:  Topical application (Pain-eeze spray) Procedure type:    Complexity:  Simple Procedure details:    Needle aspiration: yes (Stab incision made with 18-gauge needle)     Needle size:  18 G   Incision types:  Stab incision   Incision depth:  Dermal   Drainage:  Bloody and purulent   Drainage amount:  Moderate   Wound treatment:   Wound left open   Packing materials:  None Post-procedure details:    Procedure completion:  Tolerated well, no immediate complications  (including critical care time)  Medications Ordered in UC Medications - No data to display  Initial Impression / Assessment and Plan / UC Course  I have reviewed the triage vital signs and the nursing notes.  Pertinent labs & imaging results that were available during my care of the patient were reviewed by me and considered in my medical decision making (see chart for details).   1.  Cellulitis of right middle finger, paronychia of right middle finger, nail biting Paronychia drained, see procedure note above for further details.  Augmentin twice daily for 7 days given nail biting/to cover for human bite bacteria.  Warm Epsom salt soaks encouraged.  Tylenol as needed for pain.  May follow-up with PCP and/or return to urgent care as needed.  Advised to avoid nailbiting in the future.  Counseled patient on potential for adverse effects with medications prescribed/recommended today, strict ER and return-to-clinic precautions discussed, patient verbalized understanding.    Final Clinical Impressions(s) / UC Diagnoses   Final diagnoses:  Cellulitis of right middle finger  Paronychia of right middle finger  Nail biting     Discharge Instructions      I drained your abscess today. Take Augmentin antibiotic twice daily for the next 7 days. Soak your finger in Epsom salt to help with the swelling and pain. You may use Tylenol as needed for pain. Stop biting your nails.   If you develop any new or worsening symptoms or if your symptoms do not start to improve, please return here or follow-up with your primary care provider. If your symptoms are severe, please go to the emergency room.      ED Prescriptions     Medication Sig Dispense Auth. Provider   amoxicillin-clavulanate (AUGMENTIN) 875-125 MG tablet Take 1 tablet by mouth every 12 (twelve)  hours. 14 tablet Carlisle Beers, FNP      PDMP not reviewed this encounter.   Carlisle Beers, [place] 08/18/23 1919

## 2023-09-22 ENCOUNTER — Encounter: Payer: Self-pay | Admitting: Physical Medicine and Rehabilitation

## 2023-09-22 ENCOUNTER — Encounter
Payer: Medicare Other | Attending: Physical Medicine and Rehabilitation | Admitting: Physical Medicine and Rehabilitation

## 2023-09-22 VITALS — BP 121/86 | HR 68 | Ht 72.0 in

## 2023-09-22 DIAGNOSIS — R252 Cramp and spasm: Secondary | ICD-10-CM | POA: Diagnosis not present

## 2023-09-22 DIAGNOSIS — I69354 Hemiplegia and hemiparesis following cerebral infarction affecting left non-dominant side: Secondary | ICD-10-CM | POA: Diagnosis not present

## 2023-09-22 MED ORDER — ONABOTULINUMTOXINA 100 UNITS IJ SOLR
100.0000 [IU] | Freq: Once | INTRAMUSCULAR | Status: AC
Start: 2023-09-22 — End: 2023-09-22
  Administered 2023-09-22: 100 [IU] via INTRAMUSCULAR

## 2023-09-22 NOTE — Progress Notes (Signed)
Botox for muscle spasticity  Dilution: 1:1 Units/ml Indication: Severe spasticity which interferes with ADL,mobility and/or  hygiene and is unresponsive to medication management and other conservative care Informed consent was obtained after describing risks and benefits of the procedure with the patient. This includes bleeding, bruising, infection, excessive weakness, or medication side effects. A REMS form is on file and signed.  Pectoralis: 100  All injections were done after obtaining appropriate [place] visualization and after negative drawback for blood. The patient tolerated the procedure well. Post procedure instructions were given. A followup appointment was made.

## 2023-10-13 ENCOUNTER — Ambulatory Visit (INDEPENDENT_AMBULATORY_CARE_PROVIDER_SITE_OTHER): Payer: Medicare Other

## 2023-10-13 VITALS — Ht 72.0 in | Wt 285.0 lb

## 2023-10-13 DIAGNOSIS — Z0000 Encounter for general adult medical examination without abnormal findings: Secondary | ICD-10-CM | POA: Diagnosis not present

## 2023-10-13 NOTE — Patient Instructions (Addendum)
Matthew Black , Thank you for taking time to come for your Medicare Wellness Visit. I appreciate your ongoing commitment to your health goals. Please review the following plan we discussed and let me know if I can assist you in the future.   Referrals/Orders/Follow-Ups/Clinician Recommendations:   This is a list of the screening recommended for you and due dates:  Health Maintenance  Topic Date Due   Medicare Annual Wellness Visit  10/12/2024   DTaP/Tdap/Td vaccine (2 - Td or Tdap) 09/11/2026   Colon Cancer Screening  05/19/2028   Flu Shot  Completed   Hepatitis C Screening  Completed   HIV Screening  Completed   HPV Vaccine  Aged Out   COVID-19 Vaccine  Discontinued    Advanced directives: (In Chart) A copy of your advanced directives are scanned into your chart should your provider ever need it.  Next Medicare Annual Wellness Visit scheduled for next year: Yes

## 2023-10-13 NOTE — Progress Notes (Signed)
Subjective:   Matthew Black is a 47 y.o. male who presents for Medicare Annual/Subsequent preventive examination.  Visit Complete: Virtual I connected with  Faustino Congress on 10/13/23 by a audio enabled telemedicine application and verified that I am speaking with the correct person using two identifiers.  Patient Location: Home  Provider Location: Home Office  I discussed the limitations of evaluation and management by telemedicine. The patient expressed understanding and agreed to proceed.  Vital Signs: Because this visit was a virtual/telehealth visit, some criteria may be missing or patient reported. Any vitals not documented were not able to be obtained and vitals that have been documented are patient reported.    Cardiac Risk Factors include: advanced age (>54men, >80 women);male gender;hypertension     Objective:    Today's Vitals   10/13/23 1533  Weight: 285 lb (129.3 kg)  Height: 6' (1.829 m)   Body mass index is 38.65 kg/m.     10/13/2023    3:46 PM 05/11/2023    2:52 PM 05/04/2023    2:49 PM 01/26/2023    3:38 PM 09/12/2022   11:18 AM 07/18/2022   12:42 PM 03/10/2022    6:13 PM  Advanced Directives  Does Patient Have a Medical Advance Directive? Yes Yes  Yes Yes No No  Type of Estate agent of [place];Living will Healthcare Power of [employer] Power of [employer] Power of [place];Living will Healthcare Power of [place];Living will    Does patient want to make changes to medical advance directive? No - Patient declined        Copy of Healthcare Power of Attorney in Chart? Yes - validated most recent copy scanned in chart (See row information) No - copy requested No - copy requested No - copy requested       Current Medications (verified) Outpatient Encounter Medications as of 10/13/2023  Medication Sig   amoxicillin-clavulanate (AUGMENTIN) 875-125 MG tablet Take 1 tablet by mouth every 12 (twelve) hours. (Patient not  taking: Reported on 09/22/2023)   ASPIRIN LOW DOSE 81 MG tablet TAKE 1 TABLET (81 MG TOTAL) BY MOUTH DAILY. SWALLOW WHOLE.   atorvastatin (LIPITOR) 10 MG tablet Take 1 tablet (10 mg total) by mouth daily.   baclofen (LIORESAL) 10 MG tablet TAKE 1 TABLET BY MOUTH THREE TIMES A DAY   metoprolol tartrate (LOPRESSOR) 50 MG tablet Take 1 tablet (50 mg total) by mouth 2 (two) times daily.   tiZANidine (ZANAFLEX) 4 MG tablet Take 1 tablet (4 mg total) by mouth at bedtime as needed for muscle spasms.   No facility-administered encounter medications on file as of 10/13/2023.    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   Stroke (HCC)    RICA, R A1, R MCA occlusion 07/19/19   Past Surgical History:  Procedure Laterality Date   CRANIOPLASTY Right 06/05/2020   Procedure: CRANIOPLASTY;  Surgeon: Lisbeth Renshaw, MD;  Location: [facility] OR;  Service: Neurosurgery;  Laterality: Right;  right  CRANIOTOMY Right 07/19/2019   Procedure: RIGHT HEMI-CRANIECTOMY With implantation of skull flap to abdominal wall;  Surgeon: Lisbeth Renshaw, MD;  Location: [facility] OR;  Service: Neurosurgery;  Laterality: Right;   CYST EXCISION N/A 10/08/2016   Procedure: EXCISION OF POSTERIOR NECK CYST;  Surgeon: Berna Bue, MD;  Location: [facility] ORS;  Service: General;  Laterality: N/A;   CYSTOSCOPY/URETEROSCOPY/HOLMIUM LASER/STENT PLACEMENT Right 03/16/2020   Procedure: CYSTOSCOPY RIGHT RETROGRADE PYELOGRAM URETEROSCOPY/HOLMIUM LASER/STENT PLACEMENT;  Surgeon: Crista Elliot, MD;   Location: [facility];  Service: Urology;  Laterality: Right;   INCISION AND DRAINAGE ABSCESS N/A 09/22/2014   Procedure: INCISION AND DRAINAGE ABSCESS POSTERIOR NECK;  Surgeon: Valarie Merino, MD;  Location: [facility];  Service: General;  Laterality: N/A;   INCISION AND DRAINAGE ABSCESS N/A 12/20/2015   Procedure: INCISION AND DRAINAGE POSTERIOR NECK MASS;  Surgeon: Darnell Level, MD;  Location: [facility];  Service: General;  Laterality: N/A;   INCISION AND DRAINAGE ABSCESS Left 07/10/2004   middle finger   IR IVC FILTER PLMT / S&I /IMG GUID/MOD SED  07/21/2019   IR RADIOLOGIST EVAL & MGMT  12/14/2019   IR RADIOLOGIST EVAL & MGMT  12/21/2020   IR VENOGRAM RENAL UNI RIGHT  07/21/2019   MASS EXCISION N/A 07/21/2017   Procedure: EXCISION OF BENIGN NECK LESION WITH LAYERED CLOSURE;  Surgeon: Glenna Fellows, MD;  Location: [facility];  Service: Plastics;  Laterality: N/A;   MASS EXCISION N/A 11/10/2017   Procedure: EXCISION BENIGN LESION OF THE NECK WITH LAYERED CLOSURE;  Surgeon: Glenna Fellows, MD;  Location: [facility];  Service: Plastics;  Laterality: N/A;   Family History  Problem Relation Age of Onset   Healthy Mother    Healthy Father    Clotting disorder Maternal Aunt    Clotting disorder Maternal Uncle    Clotting disorder Maternal Grandmother    Diabetes Paternal Grandfather    Colon cancer Neg Hx    Rectal cancer Neg Hx    Stomach cancer Neg Hx    Social History   Socioeconomic History   Marital status: Married    Spouse name: Vangie Bicker   Number of children: 2   Years of education: Not on file   Highest education level: Not on file  Occupational History   Occupation: unemployed 01/30/21  Tobacco Use   Smoking status: Former    Current packs/day: 0.00    Types: Cigarettes    Quit date: 11/24/2015    Years since quitting: 7.8   Smokeless tobacco: Never   Tobacco comments:       Vaping Use   Vaping status: Never Used  Substance and Sexual Activity    Alcohol use: Yes    Comment: socially   Drug use: Never   Sexual activity: Not Currently  Other Topics Concern   Not on file  Social History Narrative   Lives with wife and kids   Right Handed   Drinks >10 cups caffeine daily   Social Determinants of Health   Financial Resource Strain: Low Risk  (10/13/2023)   Overall Financial Resource Strain (CARDIA)    Difficulty of Paying Living Expenses: Not hard at all  Food Insecurity: No Food Insecurity (10/13/2023)   Hunger Vital Sign    Worried About Running Out of Food in the Last Year: Never true    Ran Out of Food in the Last Year: Never true  Transportation Needs: No Transportation Needs (10/13/2023)   PRAPARE - Transportation  Lack of Transportation (Medical): No    Lack of Transportation (Non-Medical): No  Physical Activity: Inactive (10/13/2023)   Exercise Vital Sign    Days of Exercise per Week: 0 days    Minutes of Exercise per Session: 0 min  Stress: No Stress Concern Present (10/13/2023)   [employer] of Occupational Health - Occupational Stress Questionnaire    Feeling of Stress : Not at all  Social Connections: Socially Integrated (10/13/2023)   Social Connection and Isolation Panel [NHANES]    Frequency of Communication with Friends and Family: More than three times a week    Frequency of Social Gatherings with Friends and Family: More than three times a week    Attends Religious Services: More than 4 times per year    Active Member of [employer] or Organizations: Yes    Attends Engineer, structural: More than 4 times per year    Marital Status: Married    Tobacco Counseling Counseling given: Not Answered Tobacco comments:     Clinical Intake:  Pre-visit preparation completed: Yes  Pain : No/denies pain     BMI - recorded: 38.65 Nutritional Status: BMI > 30  Obese Nutritional Risks: None Diabetes: No  How often do you need to have someone help you when you read instructions, pamphlets, or  other written materials from your doctor or pharmacy?: 1 - Never  Interpreter Needed?: No  Information entered by :: Theresa Mulligan LPN   Activities of Daily Living    10/13/2023    3:40 PM  In your present state of health, do you have any difficulty performing the following activities:  Hearing? 0  Vision? 0  Difficulty concentrating or making decisions? 0  Walking or climbing stairs? 1  Comment Uses a Agricultural consultant or bathing? 0  Doing errands, shopping? 1  Comment Wife Ship broker and eating ? Y  Comment Wife assist  Using the Toilet? N  In the past six months, have you accidently leaked urine? Y  Comment Wears Breifs. Followed by PCP  Do you have problems with loss of bowel control? Y  Comment Wears Breifs. Followed by PCP  Managing your Medications? N  Managing your Finances? N  Housekeeping or managing your Housekeeping? Y  Comment Wife assist    Patient Care Team: Saguier, Kateri Mc as PCP - General (Internal Medicine) Darnell Level, MD as Consulting Physician (General Surgery) Crista Elliot, MD as Consulting Physician (Urology)  Indicate any recent Medical Services you may have received from other than [facility] providers in the past year (date may be approximate).     Assessment:   This is a routine wellness examination for [place].  Hearing/Vision screen Hearing Screening - Comments:: Denies hearing difficulties   Vision Screening - Comments:: - up to date with routine eye exams with  [facility]   Goals Addressed               This Visit's Progress     Increase physical activity (pt-stated)        Walk without assistance and move left hand.       Depression Screen    10/13/2023    3:40 PM 06/16/2023   12:53 PM 12/15/2022    1:05 PM 06/10/2022   11:24 AM 01/21/2022   11:24 AM 09/24/2021   11:15 AM 07/19/2021    1:58 PM  PHQ 2/9 Scores  PHQ - 2 Score 0 0 0 0 0 0 0  PHQ- 9 Score       0    Fall Risk     10/13/2023    3:44 PM 06/16/2023   12:52 PM 12/15/2022    1:05 PM 09/12/2022   11:16 AM 06/10/2022   11:23 AM  Fall Risk   Falls in the past year? 0 0 1 1 0  Number falls in past yr: 0 0 0 0   Injury with Fall? 0 0 1 1   Comment    pain all over   Risk for fall due to : No Fall Risks  Impaired balance/gait  Impaired balance/gait  Follow up Falls prevention discussed        MEDICARE RISK AT HOME: Medicare Risk at Home Any stairs in or around the home?: Yes If so, are there any without handrails?: No Home free of loose throw rugs in walkways, pet beds, electrical cords, etc?: Yes Adequate lighting in your home to reduce risk of falls?: Yes Life alert?: No Use of a cane, walker or w/c?: Yes Grab bars in the bathroom?: No Shower chair or bench in shower?: No Elevated toilet seat or a handicapped toilet?: No  TIMED UP AND GO:  Was the test performed?  No    Cognitive Function:        10/13/2023    3:46 PM  6CIT Screen  What Year? 0 points  What month? 0 points  What time? 0 points  Count back from 20 0 points  Months in reverse 0 points  Repeat phrase 0 points  Total Score 0 points    Immunizations Immunization History  Administered Date(s) Administered   Influenza,inj,Quad PF,6+ Mos 09/20/2021, 08/15/2022   Influenza-Unspecified 08/25/2023   Moderna Covid-19 Vaccine Bivalent Booster 18yrs & up 12/03/2021   Moderna Sars-Covid-2 Vaccination 02/10/2020, 03/13/2020, 06/28/2021   Tdap 09/11/2016    TDAP status: Up to date  Flu Vaccine status: Up to date   Screening Tests Health Maintenance  Topic Date Due   Medicare Annual Wellness (AWV)  10/12/2024   DTaP/Tdap/Td (2 - Td or Tdap) 09/11/2026   Colonoscopy  05/19/2028   INFLUENZA VACCINE  Completed   Hepatitis C Screening  Completed   HIV Screening  Completed   HPV VACCINES  Aged Out   COVID-19 Vaccine  Discontinued    Health Maintenance  There are no preventive care reminders to display for this  patient.   Colorectal cancer screening: Type of screening: Colonoscopy. Completed 05/20/23. Repeat every 5 years    Additional Screening:  Hepatitis C Screening: does qualify; Completed 08/06/19  Vision Screening: Recommended annual ophthalmology exams for early detection of glaucoma and other disorders of the eye. Is the patient up to date with their annual eye exam?  Yes  Who is the provider or what is the name of the office in which the patient attends annual eye exams? [facility] If pt is not established with a provider, would they like to be referred to a provider to establish care? No .   Dental Screening: Recommended annual dental exams for proper oral hygiene    Community Resource Referral / Chronic Care Management:  CRR required this visit?  No   CCM required this visit?  No     Plan:     I have personally reviewed and noted the following in the patient's chart:   Medical and social history Use of alcohol, tobacco or illicit drugs  Current medications and supplements including opioid prescriptions. Patient is not currently  taking opioid prescriptions. Functional ability and status Nutritional status Physical activity Advanced directives List of other physicians Hospitalizations, surgeries, and ER visits in previous 12 months Vitals Screenings to include cognitive, depression, and falls Referrals and appointments  In addition, I have reviewed and discussed with patient certain preventive protocols, quality metrics, and best practice recommendations. A written personalized care plan for preventive services as well as general preventive health recommendations were provided to patient.     Tillie Rung, LPN   [id number]   After Visit Summary: (MyChart) Due to this being a telephonic visit, the after visit summary with patients personalized plan was offered to patient via MyChart   Nurse Notes: None

## 2023-10-19 ENCOUNTER — Other Ambulatory Visit: Payer: Self-pay | Admitting: *Deleted

## 2023-10-19 DIAGNOSIS — I10 Essential (primary) hypertension: Secondary | ICD-10-CM

## 2023-10-26 ENCOUNTER — Ambulatory Visit (HOSPITAL_COMMUNITY)
Admission: RE | Admit: 2023-10-26 | Discharge: 2023-10-26 | Disposition: A | Discharge status: Home / Self Care | Payer: Medicare Other | Source: Ambulatory Visit | Attending: Emergency Medicine | Admitting: Emergency Medicine

## 2023-10-26 ENCOUNTER — Encounter (HOSPITAL_COMMUNITY): Payer: Self-pay

## 2023-10-26 VITALS — BP 131/95 | HR 84 | Temp 97.6°F | Resp 16

## 2023-10-26 DIAGNOSIS — M79605 Pain in left leg: Secondary | ICD-10-CM

## 2023-10-26 DIAGNOSIS — G8929 Other chronic pain: Secondary | ICD-10-CM

## 2023-10-26 DIAGNOSIS — M542 Cervicalgia: Secondary | ICD-10-CM

## 2023-10-26 DIAGNOSIS — M25512 Pain in left shoulder: Secondary | ICD-10-CM

## 2023-10-26 MED ORDER — DEXAMETHASONE SODIUM PHOSPHATE 10 MG/ML IJ SOLN
10.0000 mg | Freq: Once | INTRAMUSCULAR | Status: AC
  Administered 2023-10-26: 10 mg via INTRAMUSCULAR

## 2023-10-26 MED ORDER — DEXAMETHASONE SODIUM PHOSPHATE 10 MG/ML IJ SOLN
INTRAMUSCULAR | Status: AC
  Filled 2023-10-26: qty 1

## 2023-10-26 MED ORDER — TIZANIDINE HCL 4 MG PO TABS: 4.0000 mg | ORAL_TABLET | Freq: Four times a day (QID) | ORAL | 0 refills | Status: DC | PRN
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
## 2023-10-27 ENCOUNTER — Telehealth: Payer: Self-pay | Admitting: Physical Medicine and Rehabilitation

## 2023-10-27 NOTE — Telephone Encounter (Signed)
Patient called in requesting new handicap placard , his already expired

## 2023-10-28 NOTE — Telephone Encounter (Signed)
Sent patient a myChart message. 

## 2023-11-20 ENCOUNTER — Telehealth: Payer: Self-pay | Admitting: Specialist

## 2023-11-20 ENCOUNTER — Telehealth: Payer: Self-pay

## 2023-11-20 NOTE — Telephone Encounter (Signed)
Call placed to Matthew Black, about handicap placard, a few calls were placed as well as a mychart that has not been ready, left another vm with patient to advise he needs to come in the office and pick one up or either drop one off filled out and doctor can sign

## 2023-11-20 NOTE — Telephone Encounter (Signed)
Mr. Vanduyne called and stated his handicap placard is out of date.  Please call him and discuss how he can obtain an updated placard. Shirlean Mylar, MHA, OT/L [phone]

## 2023-11-20 NOTE — Telephone Encounter (Signed)
Notified. 

## 2023-11-20 NOTE — Telephone Encounter (Signed)
Matthew Black has requested an Disability Placard application. One has been left in your box. When completed he will have someone pick it up.  Call back phone [phone].

## 2023-11-23 ENCOUNTER — Telehealth (HOSPITAL_BASED_OUTPATIENT_CLINIC_OR_DEPARTMENT_OTHER): Payer: Self-pay

## 2023-11-23 ENCOUNTER — Ambulatory Visit (INDEPENDENT_AMBULATORY_CARE_PROVIDER_SITE_OTHER): Payer: Medicare Other | Admitting: Medical

## 2023-11-23 VITALS — BP 122/88 | HR 70 | Temp 98.0°F | Resp 18 | Ht 72.0 in | Wt 285.0 lb

## 2023-11-23 DIAGNOSIS — R739 Hyperglycemia, unspecified: Secondary | ICD-10-CM | POA: Diagnosis not present

## 2023-11-23 DIAGNOSIS — Z8673 Personal history of transient ischemic attack (TIA), and cerebral infarction without residual deficits: Secondary | ICD-10-CM

## 2023-11-23 DIAGNOSIS — M79609 Pain in unspecified limb: Secondary | ICD-10-CM | POA: Diagnosis not present

## 2023-11-23 DIAGNOSIS — M79662 Pain in left lower leg: Secondary | ICD-10-CM | POA: Diagnosis not present

## 2023-11-23 DIAGNOSIS — E7849 Other hyperlipidemia: Secondary | ICD-10-CM | POA: Diagnosis not present

## 2023-11-23 DIAGNOSIS — I10 Essential (primary) hypertension: Secondary | ICD-10-CM
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
## 2023-11-24 ENCOUNTER — Telehealth: Payer: Self-pay

## 2023-11-24 ENCOUNTER — Ambulatory Visit (INDEPENDENT_AMBULATORY_CARE_PROVIDER_SITE_OTHER): Payer: Medicaid Other | Admitting: Adult Health

## 2023-11-24 ENCOUNTER — Encounter: Payer: Self-pay | Admitting: Adult Health

## 2023-11-24 VITALS — BP 144/112 | HR 78 | Ht 71.0 in | Wt 285.1 lb

## 2023-11-24 DIAGNOSIS — I63511 Cerebral infarction due to unspecified occlusion or stenosis of right middle cerebral artery: Secondary | ICD-10-CM

## 2023-11-24 DIAGNOSIS — I6521 Occlusion and stenosis of right carotid artery: Secondary | ICD-10-CM

## 2023-11-24 LAB — COMPREHENSIVE METABOLIC PANEL
ALT: 29 U/L (ref 0–53)
AST: 21 U/L (ref 0–37)
Albumin: 4.4 g/dL (ref 3.5–5.2)
Alkaline Phosphatase: 161 U/L — ABNORMAL HIGH (ref 39–117)
BUN: 12 mg/dL (ref 6–23)
CO2: 30 meq/L (ref 19–32)
Calcium: 9.5 mg/dL (ref 8.4–10.5)
Chloride: 106 meq/L (ref 96–112)
Creatinine, Ser: 1.24 mg/dL (ref 0.40–1.50)
GFR: 69.98 mL/min (ref >60.00)
Glucose, Bld: 90 mg/dL (ref 70–99)
Potassium: 4.1 meq/L (ref 3.5–5.1)
Sodium: 144 meq/L (ref 135–145)
Total Bilirubin: 0.5 mg/dL (ref 0.2–1.2)
Total Protein: 8.1 g/dL (ref 6.0–8.3)

## 2023-11-24 LAB — LIPID PANEL
Cholesterol: 146 mg/dL (ref 0–200)
HDL: 32.1 mg/dL — ABNORMAL LOW (ref >39.00)
LDL Cholesterol: 89 mg/dL (ref 0–99)
NonHDL: 114.2
Total CHOL/HDL Ratio: 5
Triglycerides: 125 mg/dL (ref 0.0–149.0)
VLDL: 25 mg/dL (ref 0.0–40.0)

## 2023-11-24 LAB — HEMOGLOBIN A1C: Hgb A1c MFr Bld: 6.4 % (ref 4.6–6.5)

## 2023-11-24 NOTE — Patient Instructions (Addendum)
 You will be called to repeat a CTA head/neck to further evaluation   Will complete paper work for [facility] and will fax back to PCP  Continue to follow with Dr. Lorilee as scheduled  Continue aspirin  81 mg daily  and atorvastatin  10mg  daily  for secondary stroke prevention  Continue to follow up with PCP regarding cholesterol and blood pressure management  Maintain strict control of hypertension with blood pressure goal below 130/90 and cholesterol with LDL cholesterol (bad cholesterol) goal below 70 mg/dL.   Signs of a Stroke? Follow the BEFAST method:  Balance Watch for a sudden loss of balance, trouble with coordination or vertigo Eyes Is there a sudden loss of vision in one or both eyes? Or double vision?  Face: Ask the person to smile. Does one side of the face droop or is it numb?  Arms: Ask the person to raise both arms. Does one arm drift downward? Is there weakness or numbness of a leg? Speech: Ask the person to repeat a simple phrase. Does the speech sound slurred/strange? Is the person confused ? Time: If you observe any of these signs, call 911.        Thank you for coming to see us  at [facility]. I hope we have been able to provide you high quality care today.  You may receive a patient satisfaction survey over the next few weeks. We would appreciate your feedback and comments so that we may continue to improve ourselves and the health of our patients.

## 2023-11-24 NOTE — Telephone Encounter (Signed)
Signed. Thank you.

## 2023-11-24 NOTE — Telephone Encounter (Signed)
[facility] forms completed and placed on NP desk for review.

## 2023-11-24 NOTE — Telephone Encounter (Addendum)
Placed in MR for pick up to be faxed to PCP.
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
## 2023-11-27 ENCOUNTER — Ambulatory Visit
Admission: RE | Admit: 2023-11-27 | Discharge: 2023-11-27 | Disposition: A | Discharge status: Home / Self Care | Payer: Medicare Other | Source: Ambulatory Visit | Attending: Adult Health | Admitting: Adult Health

## 2023-11-27 ENCOUNTER — Other Ambulatory Visit: Payer: Self-pay | Admitting: Adult Health

## 2023-11-27 ENCOUNTER — Ambulatory Visit
Admission: RE | Admit: 2023-11-27 | Discharge: 2023-11-27 | Disposition: A | Discharge status: Home / Self Care | Payer: Medicare Other | Source: Ambulatory Visit | Attending: Adult Health

## 2023-11-27 ENCOUNTER — Telehealth: Payer: Self-pay

## 2023-11-27 DIAGNOSIS — I6521 Occlusion and stenosis of right carotid artery: Secondary | ICD-10-CM

## 2023-11-27 DIAGNOSIS — I63511 Cerebral infarction due to unspecified occlusion or stenosis of right middle cerebral artery: Secondary | ICD-10-CM

## 2023-11-27 DIAGNOSIS — Z8673 Personal history of transient ischemic attack (TIA), and cerebral infarction without residual deficits: Secondary | ICD-10-CM | POA: Diagnosis not present

## 2023-11-27 MED ORDER — IOPAMIDOL (ISOVUE-370) INJECTION 76%
200.0000 mL | Freq: Once | INTRAVENOUS | Status: AC | PRN
  Administered 2023-11-27: 75 mL via INTRAVENOUS

## 2023-11-27 NOTE — Telephone Encounter (Signed)
 Pt neuro assessment for the DMV form placed in red folder

## 2023-11-29 NOTE — Telephone Encounter (Signed)
 Pt needs to fill out his consent form page and he needs to take copy of eye page to Ophthalmologist MD or optometrist he just saw. Since they say he needs glasses needs. That form filled out. When he does both then can send packet to  [facility]. I will give you packet so you can give him copy of eye specialist form.

## 2023-11-30 NOTE — Telephone Encounter (Signed)
 Copied from CRM [id number]. Topic: General - Other >> Nov 30, 2023  2:45 PM Fredrich Romans wrote: Reason for CRM: Patient called to check on status of DMV paperwork being completed for him to pick up

## 2023-11-30 NOTE — Telephone Encounter (Signed)
 Copied from CRM [id number]. Topic: General - Other >> Nov 30, 2023  3:15 PM Gurney Maxin H wrote: Reason for CRM: Patient states he missed a call from clinic regarding paperwork and was told to call back on message.     [place] [phone]

## 2023-11-30 NOTE — Telephone Encounter (Signed)
 Pt called and lvm to return call to advise pt of pcp recommendations below

## 2023-11-30 NOTE — Telephone Encounter (Signed)
 Refer to other phone note .

## 2023-11-30 NOTE — Telephone Encounter (Signed)
 The form has been completed and at the front  desk for pick-up. Patient aware.

## 2023-12-02 NOTE — Telephone Encounter (Signed)
 Left message on machine for patient to call back.  Per Dallas- Pt needs to fill out his consent form page and he needs to take copy of eye page to Ophthalmologist MD or optometrist he just saw. Since they say he needs glasses needs. That form filled out. When he does both then can send packet to [facility]. I will give you packet so you can give him copy of eye specialist form.   Form will be at front for pickp

## 2023-12-03 ENCOUNTER — Telehealth: Payer: Self-pay | Admitting: *Deleted

## 2023-12-03 NOTE — Telephone Encounter (Signed)
-----   Message from [place] sent at 11/30/2023 11:13 AM EST ----- Please call patient for results per his request. Repeat CTA head/neck shows stable appearance of R ICA occlusion, which is now noted to be chronically occluded with some flow to the R MCA and ACA branches, this is unchanged compared to prior imaging. No need for any repeat imaging or change in current medical therapy at this time. Continue close PCP f/u for aggressive stroke risk factor management.

## 2023-12-03 NOTE — Telephone Encounter (Signed)
LVM for pt to call back and review CT results.

## 2023-12-07 NOTE — Telephone Encounter (Signed)
 Pt is asking for a return call with results to CT scan.

## 2023-12-07 NOTE — Telephone Encounter (Signed)
 Spoke to pt gave CTA results Gave Jessica,NP recommendation to f/u with PCP for aggressive stroke risk factor management. Pt expressed understanding and thanked me for calling

## 2023-12-15 ENCOUNTER — Ambulatory Visit: Payer: Medicare Other | Admitting: Physical Medicine and Rehabilitation

## 2023-12-22 ENCOUNTER — Encounter: Payer: Medicare PPO | Attending: Physical Medicine and Rehabilitation | Admitting: Physical Medicine and Rehabilitation

## 2023-12-22 DIAGNOSIS — R252 Cramp and spasm: Secondary | ICD-10-CM | POA: Insufficient documentation

## 2023-12-22 MED ORDER — SODIUM CHLORIDE (PF) 0.9 % IJ SOLN
1.0000 mL | Freq: Once | INTRAMUSCULAR | Status: AC
Start: 2023-12-22 — End: 2023-12-22
  Administered 2023-12-22: 1 mL via INTRAVENOUS

## 2023-12-22 MED ORDER — ONABOTULINUMTOXINA 100 UNITS IJ SOLR
100.0000 [IU] | Freq: Once | INTRAMUSCULAR | Status: AC
Start: 2023-12-22 — End: 2023-12-22
  Administered 2023-12-22: 100 [IU] via INTRAMUSCULAR

## 2023-12-22 NOTE — Addendum Note (Signed)
Addended by: Silas Sacramento T on: 12/22/2023 03:48 PM   Modules accepted: Orders

## 2023-12-22 NOTE — Progress Notes (Signed)
Botox for muscle spasticity  Dilution: 1:1 Units/ml Indication: Severe spasticity which interferes with ADL,mobility and/or  hygiene and is unresponsive to medication management and other conservative care Informed consent was obtained after describing risks and benefits of the procedure with the patient. This includes bleeding, bruising, infection, excessive weakness, or medication side effects. A REMS form is on file and signed.  Pectoralis: 100  All injections were done after obtaining appropriate [place] visualization and after negative drawback for blood. The patient tolerated the procedure well. Post procedure instructions were given. A followup appointment was made.

## 2023-12-25 ENCOUNTER — Other Ambulatory Visit: Payer: Self-pay | Admitting: Physical Medicine and Rehabilitation

## 2023-12-25 DIAGNOSIS — I10 Essential (primary) hypertension: Secondary | ICD-10-CM

## 2023-12-28 ENCOUNTER — Telehealth: Payer: Self-pay

## 2023-12-28 DIAGNOSIS — R531 Weakness: Secondary | ICD-10-CM

## 2023-12-28 DIAGNOSIS — I63411 Cerebral infarction due to embolism of right middle cerebral artery: Secondary | ICD-10-CM

## 2023-12-28 DIAGNOSIS — Z8673 Personal history of transient ischemic attack (TIA), and cerebral infarction without residual deficits: Secondary | ICD-10-CM

## 2023-12-28 NOTE — Telephone Encounter (Signed)
Copied from CRM [id number]. Topic: Clinical - Medical Advice >> Dec 28, 2023  2:39 PM [place] wrote: Reason for CRM: Patient had Home Health Care with his previous insurance and switched insurances and needs a new authorization to be sent to [facility] # [phone] Fax # [phone]

## 2023-12-29 NOTE — Telephone Encounter (Signed)
 Copied from CRM [id number]. Topic: General - Other >> Dec 29, 2023  3:16 PM Corin V wrote: Reason for CRM: Patient is needing Edward Saguier, PA-C to authorize a release for his aide to come back out to see him.  Please contact: [facility] Agency # [phone] Fax # [phone]

## 2024-01-01 ENCOUNTER — Ambulatory Visit: Payer: Medicare Other | Attending: Physical Medicine and Rehabilitation | Admitting: Physical Therapy

## 2024-01-01 ENCOUNTER — Encounter: Payer: Self-pay | Admitting: Physical Therapy

## 2024-01-01 VITALS — BP 117/81 | HR 70

## 2024-01-01 DIAGNOSIS — Z741 Need for assistance with personal care: Secondary | ICD-10-CM | POA: Diagnosis not present

## 2024-01-01 DIAGNOSIS — R29818 Other symptoms and signs involving the nervous system: Secondary | ICD-10-CM | POA: Diagnosis not present

## 2024-01-01 DIAGNOSIS — M24542 Contracture, left hand: Secondary | ICD-10-CM | POA: Diagnosis not present

## 2024-01-01 DIAGNOSIS — R252 Cramp and spasm: Secondary | ICD-10-CM | POA: Insufficient documentation

## 2024-01-01 DIAGNOSIS — R278 Other lack of coordination: Secondary | ICD-10-CM | POA: Diagnosis not present

## 2024-01-01 DIAGNOSIS — M6281 Muscle weakness (generalized): Secondary | ICD-10-CM | POA: Diagnosis not present

## 2024-01-01 DIAGNOSIS — R2689 Other abnormalities of gait and mobility: Secondary | ICD-10-CM | POA: Diagnosis not present

## 2024-01-01 DIAGNOSIS — I69354 Hemiplegia and hemiparesis following cerebral infarction affecting left non-dominant side: Secondary | ICD-10-CM | POA: Diagnosis not present

## 2024-01-01 NOTE — Telephone Encounter (Signed)
 Left voicemail on secure phone

## 2024-01-01 NOTE — Therapy (Signed)
 OUTPATIENT PHYSICAL THERAPY NEURO EVALUATION ONLY   Patient Name: Matthew Black MRN: [id number] DOB:04-Nov-1976, 48 y.o., male Today's Date: 01/01/2024   PCP: Dorina Loving, PA-C  REFERRING PROVIDER: Lorilee Sven SQUIBB, MD  END OF SESSION:  PT End of Session - 01/01/24 1400     Visit Number 1    Number of Visits 1    Authorization Type [employer]ICARE    PT Start Time 1358    PT Stop Time 1438    PT Time Calculation (min) 40 min    Equipment Utilized During Treatment Gait belt    Activity Tolerance Patient tolerated treatment well    Behavior During Therapy WFL for tasks assessed/performed             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   Stroke (HCC)    RICA, R A1, R MCA occlusion 07/19/19   Past Surgical History:  Procedure Laterality Date   CRANIOPLASTY Right 06/05/2020   Procedure: CRANIOPLASTY;  Surgeon: Lanis Pupa, MD;  Location: [facility] OR;  Service: Neurosurgery;  Laterality: Right;  right   CRANIOTOMY Right 07/19/2019   Procedure: RIGHT HEMI-CRANIECTOMY With implantation of skull flap to abdominal wall;  Surgeon: Lanis Pupa, MD;  Location: [facility] OR;  Service: Neurosurgery;  Laterality: Right;   CYST EXCISION N/A 10/08/2016   Procedure: EXCISION OF POSTERIOR NECK CYST;  Surgeon: Mitzie DELENA Freund, MD;  Location: [facility] ORS;  Service: General;  Laterality: N/A;   CYSTOSCOPY/URETEROSCOPY/HOLMIUM LASER/STENT PLACEMENT Right 03/16/2020   Procedure: CYSTOSCOPY RIGHT RETROGRADE PYELOGRAM URETEROSCOPY/HOLMIUM LASER/STENT PLACEMENT;  Surgeon: Carolee Sherwood JONETTA DOUGLAS, MD;  Location: [facility];  Service: Urology;  Laterality: Right;   INCISION AND DRAINAGE ABSCESS N/A 09/22/2014   Procedure: INCISION AND DRAINAGE ABSCESS POSTERIOR NECK;  Surgeon: Donnice KATHEE Lunger, MD;  Location: [facility];  Service: General;  Laterality: N/A;   INCISION AND DRAINAGE ABSCESS N/A 12/20/2015   Procedure: INCISION AND DRAINAGE POSTERIOR NECK MASS;  Surgeon: Krystal Spinner, MD;  Location: [facility];  Service: General;  Laterality: N/A;   INCISION AND DRAINAGE ABSCESS Left 07/10/2004   middle finger   IR IVC FILTER PLMT / S&I /IMG GUID/MOD SED  07/21/2019   IR RADIOLOGIST EVAL & MGMT  12/14/2019   IR RADIOLOGIST EVAL & MGMT  12/21/2020   IR VENOGRAM RENAL UNI RIGHT  07/21/2019   MASS EXCISION N/A 07/21/2017   Procedure: EXCISION OF BENIGN NECK LESION WITH LAYERED CLOSURE;  Surgeon: Arelia Filippo, MD;  Location: [facility];  Service: Plastics;  Laterality: N/A;   MASS EXCISION N/A 11/10/2017   Procedure: EXCISION BENIGN LESION OF THE NECK WITH LAYERED CLOSURE;  Surgeon: Arelia Filippo, MD;  Location: [facility];  Service: Plastics;  Laterality: N/A;   Patient Active Problem List   Diagnosis Date Noted   Hx of adenomatous colonic polyps 06/01/2023   COVID-19 virus infection 04/21/2021   Left-sided weakness 04/21/2021   S/P craniotomy 06/05/2020   History of cranioplasty 06/05/2020   Peri-rectal abscess 02/14/2020  Abnormal CT scan, pelvis 02/14/2020   Pancytopenia (HCC) 12/02/2019   Nephrolithiasis 12/02/2019   Hydronephrosis with renal and ureteral calculus obstruction 12/02/2019   Rectal pain 12/02/2019   Hyperkalemia 12/02/2019   Reactive depression    Wound infection after surgery    Sleep disturbance    Dysphagia, post-stroke    Transaminitis    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   Cerebral abscess    Urinary tract infection without hematuria    Altered mental status    Primary hypercoagulable state (HCC)     Acute pulmonary embolism without acute cor pulmonale (HCC)    Deep vein thrombosis (DVT) of non-extremity vein    Hypokalemia    Acute blood loss anemia    Leukocytosis    Endotracheal tube present    Acute respiratory failure with hypoxemia (HCC)    Stroke (cerebrum) (HCC) 07/19/2019   Pressure injury of skin 07/19/2019   Acute CVA (cerebrovascular accident) (HCC)    Encephalopathy    Dysphagia    Acute encephalopathy    Essential hypertension    Obesity 03/12/2016   Scalp abscess 12/20/2015   Neck abscess 12/20/2015   Pilonidal cyst 02/08/2013    ONSET DATE: 12/22/2023  REFERRING DIAG:R25.2 (ICD-10-CM) - Spasticity  THERAPY DIAG:  Hemiplegia and hemiparesis following cerebral infarction affecting left non-dominant side (HCC)  Other abnormalities of gait and mobility  Other symptoms and signs involving the nervous system  Rationale for Evaluation and Treatment: Rehabilitation  SUBJECTIVE:                                                                                                                                                                                             SUBJECTIVE STATEMENT: Reports the doctor said he needed PT. Reports he needs to strengthen his legs because his walking distance is not where it needs to be. Walked to the bathroom one night without his brace and thinks he hurt something (unsure what) - this was back in December. Reports it is feeling ok now. Thought he was strong enough, but he wasn't. Pt reports wearing his brace everyday, no falls. Has been driving (neurologist is aware). Has been doing squats and stretches at home and walking. Has been doing his homework consistently. Pt reports he used to have a care aide, but he's supposed to have another one coming back.   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 Etiology  of carotid occlusion unclear possibly dissection.   PMH:R CVA s/p hemicaniectomy 07/19/19 with abdominal flap implant. hx of seizures and headaches s/p CVA, hx of DVT with IVC filter placement . Pt s/p botox  on 12/22/23  PAIN:  Are you having pain? No  Vitals:   01/01/24 1415  BP: 117/81  Pulse: 70     PRECAUTIONS: Fall  FALLS: Has patient fallen in last 6 months? No I'm not trying to kiss the ground Pt reports if he falls then he will have to call the fire department   LIVING ENVIRONMENT: Lives with: lives with their family - wife and kids  Lives in: House/apartment Stairs: Yes: Internal: 12 steps; on right going up and External: 4 steps; can reach both Has following equipment at home: Quad cane large base, Wheelchair (manual), shower chair, and bed side commode  PLOF: Requires assistive device for independence and Needs assistance with ADLs  PATIENT GOALS: Wants to carry his items to the register when going to the store, wants to open doors (like at his house or the store)  OBJECTIVE:  Note: Objective measures were completed at Evaluation unless otherwise noted.  DIAGNOSTIC FINDINGS: CT angio head neck  IMPRESSION: 1. No acute intracranial abnormality. 2. Chronically occluded right ICA with minimal opacification of the right MCA and ACA branches. 3. Redemonstrated extensive encephalomalacia in the right MCA and ACA territories secondary to a prior infarct.  COGNITION: Overall cognitive status: Within functional limits for tasks assessed   SENSATION: Light touch: Impaired  and difficulty detecting with LLE   COORDINATION: Heel to shin: Impaired LLE due to hemiparesis    MUSCLE TONE: LLE: Hypertonic   POSTURE: rounded shoulders, forward head, posterior pelvic tilt, and weight shift right  LOWER EXTREMITY ROM:     Active  Right Eval Left Eval   Hip flexion  Tight hip flexors  Hip extension    Hip abduction    Hip adduction    Hip internal rotation    Hip external rotation    Knee flexion    Knee extension  Incr tightness in hamstrings  Ankle dorsiflexion    Ankle plantarflexion    Ankle inversion    Ankle eversion     (Blank rows = not tested)  LOWER EXTREMITY MMT:    MMT Right Eval Left Eval  Hip flexion 5 3-  Hip extension    Hip abduction 5 2  Hip adduction 5 2  Hip internal rotation    Hip external rotation    Knee flexion 5 2  Knee extension 5 2  Ankle dorsiflexion 5 0 (unable to test due to pt wearing AFO)  Ankle plantarflexion    Ankle inversion    Ankle eversion    (Blank rows = not tested)  All tested in sitting   BED MOBILITY:  Pt reports he has a hospital bed, sometimes he needs help getting out of the bed.   TRANSFERS: Assistive device utilized: Chiropractor  Sit to stand: Modified independence Stand to sit: Modified independence Use of arm rest on R side to stand   GAIT: Gait pattern: step to pattern, decreased arm swing- Left, decreased step length- Right, decreased stance time- Left, decreased hip/knee flexion- Left, decreased ankle dorsiflexion- Left, and abducted- Left Distance walked: Clinic distances  Assistive device utilized: Quad cane large base and L AFO  Level of assistance: Modified independence   FUNCTIONAL TESTS:  5 times sit to stand: 13.6 seconds with pressing up from chair with  RUE and transferring hand to [facility] in standing  Timed up and go (TUG): 21.13 seconds with [facility] with supervision  10 meter walk test: 35.8 seconds with LBQC = .91 ft/sec                                                                                                                               TREATMENT DATE:  Eval Only     PATIENT EDUCATION: Education details: Discussed clinical findings from eval and how outcome measures are improved since when pt was last in PT, pt asking about  no wearing his L AFO (PT educating on safety concerns regarding this and weakness from chronic CVA and pt would need to wear AFO to decr fall risk), pt in agreement with this after discussion with PT. Pt has been working on his HEP and walking at home. Discussed that pt does not need skilled PT at this time due to current functional status and pt working on his exercises at home, pt in agreement with plan. At this time pt would rather have OT and been seen by OT in aquatics. Pt has aquatic therapy referral and will be scheduled  Person educated: Patient Education method: Explanation Education comprehension: verbalized understanding  HOME EXERCISE PROGRAM: Recommended continuing former HEP provided at last discharge (pt reports he has been walking and working on it at home)  GOALS: N/A Eval only   ASSESSMENT:  CLINICAL IMPRESSION: Patient is a 48 year old male referred to Neuro OPPT for R CVA.   Pt's PMH is significant for: R CVA s/p hemicaniectomy 07/19/19 with abdominal flap implant. hx of seizures and headaches s/p CVA, hx of DVT with IVC filter placement . Pt s/p botox  on 12/22/23. The following deficits were present during the exam: chronic deficits from CVA with L spastic hemiparesis. Pt has been working on his exercises and walking at home. Since pt was last here, pt has had an improvement in times with his TUG, gait speed, and 5x sit <> stand. Pt asking about not using L AFO during ambulation, PT educating pt on why he needs to wear it due to chronic deficits from CVA, with pt in agreement. Based on pt's current functional level and chronic CVA, skilled PT is not warranted at this time with pt in agreement with plan. Pt also has an OT referral and would like to be seen for OT in the pool at this time.   OBJECTIVE IMPAIRMENTS: Abnormal gait, decreased activity tolerance, decreased balance, decreased coordination, difficulty walking, decreased ROM, decreased strength, decreased safety awareness,  impaired flexibility, impaired sensation, impaired tone, impaired UE functional use, and postural dysfunction.   CLINICAL DECISION MAKING: Stable/uncomplicated  EVALUATION COMPLEXITY: Low  PLAN:  PT FREQUENCY: one time visit    [place] LOISE Senate, PT, DPT 01/01/2024, 3:04 PM

## 2024-01-04 ENCOUNTER — Encounter: Payer: Self-pay | Admitting: Occupational Therapy

## 2024-01-04 ENCOUNTER — Ambulatory Visit: Payer: Medicare Other | Admitting: Occupational Therapy

## 2024-01-04 DIAGNOSIS — I69354 Hemiplegia and hemiparesis following cerebral infarction affecting left non-dominant side: Secondary | ICD-10-CM | POA: Diagnosis not present

## 2024-01-04 DIAGNOSIS — M6281 Muscle weakness (generalized): Secondary | ICD-10-CM | POA: Diagnosis not present

## 2024-01-04 DIAGNOSIS — Z741 Need for assistance with personal care: Secondary | ICD-10-CM | POA: Diagnosis not present

## 2024-01-04 DIAGNOSIS — R29818 Other symptoms and signs involving the nervous system: Secondary | ICD-10-CM

## 2024-01-04 DIAGNOSIS — R278 Other lack of coordination: Secondary | ICD-10-CM | POA: Diagnosis not present

## 2024-01-04 DIAGNOSIS — M24542 Contracture, left hand: Secondary | ICD-10-CM

## 2024-01-04 DIAGNOSIS — R252 Cramp and spasm: Secondary | ICD-10-CM | POA: Diagnosis not present

## 2024-01-04 DIAGNOSIS — R2689 Other abnormalities of gait and mobility: Secondary | ICD-10-CM | POA: Diagnosis not present

## 2024-01-04 NOTE — Therapy (Signed)
 OUTPATIENT OCCUPATIONAL THERAPY NEURO EVALUATION  Patient Name: Matthew Black MRN: [id number] DOB:12-27-1975, 48 y.o., male Today's Date: 01/04/2024  PCP: Sylvia Everts, PA-C REFERRING PROVIDER: Liam Redhead, MD  END OF SESSION:  OT End of Session - 01/04/24 1316     Visit Number 1    Number of Visits 8    Date for OT Re-Evaluation 03/04/24    Authorization Type [employer] Medicare 2025 VL: [place] Auth Reqd: [employer]    OT Start Time 1317    OT Stop Time 1400    OT Time Calculation (min) 43 min    Equipment Utilized During Treatment --    Activity Tolerance Patient tolerated treatment well    Behavior During Therapy [facility] for tasks assessed/performed             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   Stroke (HCC)    RICA, R A1, R MCA occlusion 07/19/19   Past Surgical History:  Procedure Laterality Date   CRANIOPLASTY Right 06/05/2020   Procedure: CRANIOPLASTY;  Surgeon: Augusto Blonder, MD;  Location: [facility] OR;  Service: Neurosurgery;  Laterality: Right;  right   CRANIOTOMY Right 07/19/2019   Procedure: RIGHT HEMI-CRANIECTOMY With implantation of skull flap to abdominal wall;  Surgeon: Augusto Blonder, MD;  Location: [facility] OR;  Service: Neurosurgery;  Laterality: Right;   CYST EXCISION N/A 10/08/2016   Procedure: EXCISION OF POSTERIOR NECK CYST;  Surgeon: Adalberto Acton, MD;  Location: [facility] ORS;  Service: General;  Laterality: N/A;   CYSTOSCOPY/URETEROSCOPY/HOLMIUM LASER/STENT PLACEMENT Right 03/16/2020   Procedure: CYSTOSCOPY RIGHT RETROGRADE PYELOGRAM URETEROSCOPY/HOLMIUM  LASER/STENT PLACEMENT;  Surgeon: Samson Croak, MD;  Location: [facility] ORS;  Service: Urology;  Laterality: Right;   INCISION AND DRAINAGE ABSCESS N/A 09/22/2014   Procedure: INCISION AND DRAINAGE ABSCESS POSTERIOR NECK;  Surgeon: Azucena Bollard, MD;  Location: [facility] ORS;  Service: General;  Laterality: N/A;   INCISION AND DRAINAGE ABSCESS N/A 12/20/2015   Procedure: INCISION AND DRAINAGE POSTERIOR NECK MASS;  Surgeon: Oralee Billow, MD;  Location: [facility] ORS;  Service: General;  Laterality: N/A;   INCISION AND DRAINAGE ABSCESS Left 07/10/2004   middle finger   IR IVC FILTER PLMT / S&I /IMG GUID/MOD SED  07/21/2019   IR RADIOLOGIST EVAL & MGMT  12/14/2019   IR RADIOLOGIST EVAL & MGMT  12/21/2020   IR VENOGRAM RENAL UNI RIGHT  07/21/2019   MASS EXCISION N/A 07/21/2017   Procedure: EXCISION OF BENIGN NECK LESION WITH LAYERED CLOSURE;  Surgeon: Alger Infield, MD;  Location: [facility];  Service: Plastics;  Laterality: N/A;   MASS EXCISION N/A 11/10/2017   Procedure: EXCISION BENIGN LESION OF THE NECK WITH LAYERED CLOSURE;  Surgeon: Alger Infield, MD;  Location: [facility];  Service: Plastics;  Laterality: N/A;   Patient Active Problem List   Diagnosis Date Noted   Hx of adenomatous colonic polyps 06/01/2023   COVID-19 virus infection 04/21/2021   Left-sided weakness 04/21/2021   S/P craniotomy 06/05/2020   History of cranioplasty  06/05/2020   Peri-rectal abscess 02/14/2020   Abnormal CT scan, pelvis 02/14/2020   Pancytopenia (HCC) 12/02/2019   Nephrolithiasis 12/02/2019   Hydronephrosis with renal and ureteral calculus obstruction 12/02/2019   Rectal pain 12/02/2019   Hyperkalemia 12/02/2019   Reactive depression    Wound infection after surgery    Sleep disturbance    Dysphagia, post-stroke    Transaminitis    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   Cerebral abscess    Urinary tract infection without hematuria    Altered mental status     Primary hypercoagulable state (HCC)    Acute pulmonary embolism without acute cor pulmonale (HCC)    Deep vein thrombosis (DVT) of non-extremity vein    Hypokalemia    Acute blood loss anemia    Leukocytosis    Endotracheal tube present    Acute respiratory failure with hypoxemia (HCC)    Stroke (cerebrum) (HCC) 07/19/2019   Pressure injury of skin 07/19/2019   Acute CVA (cerebrovascular accident) (HCC)    Encephalopathy    Dysphagia    Acute encephalopathy    Essential hypertension    Obesity 03/12/2016   Scalp abscess 12/20/2015   Neck abscess 12/20/2015   Pilonidal cyst 02/08/2013    ONSET DATE: 12/22/2023  REFERRING DIAG: R25.2 (ICD-10-CM) - Spasticity  THERAPY DIAG:  Muscle weakness (generalized)  Other lack of coordination  Hemiplegia and hemiparesis following cerebral infarction affecting left non-dominant side (HCC)  Contracture, left hand  Other symptoms and signs involving the nervous system  Need for assistance with personal care  Rationale for Evaluation and Treatment: Rehabilitation  SUBJECTIVE:   SUBJECTIVE STATEMENT: Pt reports he has not had an aide/nurse to help with ADLs since last year/November.  Previous OT ended 08/12/23.  Pt had Botox  last month.  Pt accompanied by: self  PERTINENT HISTORY: Botox  injection for muscle spasticity in L pectoralis 12/22/23  Presented 07/19/2019 with L sided weakness with diagnosis of Right middle cerebral artery stroke.  Patient underwent decompressive right hemicraniectomy with abdominal flap implant 07/19/2019 Inpatient Rehab: 09/06/2019 - 10/07/2019  PMHx: HTN, cranioplasty, encephalopathy  PRECAUTIONS: None  WEIGHT BEARING RESTRICTIONS: No  PAIN:  Are you having pain? No  FALLS: Has patient fallen in last 6 months? No  LIVING ENVIRONMENT:   Lives with: lives with spouse & children (87 yo daughter/14 yo son)  Patient previously had nurse that helped with bathing M-F x 3 hours/day but he reports not  having this assistance in several months.  Shower is upstairs so he generally only sponge bathes.  Lives in: House Stairs: yes 3 steps to enter with rail on the R side entering; full flight of stairs (17 steps) indoors to get upstairs to shower but no longer goes up there s/p previous fall 2+ years ago Has following equipment at home: Wheelchair (manual) and hospital bed; [facility] for bathing at bedside but goes to the bathroom to toilet; quad cane  PLOF: Prior to CVA - Independent with basic ADLs, Independent with household mobility without device, Independent with transfers,  Most recent - Needs assistance with ADLs, and Needs assistance with homemaking Patient was spending more time in [facility] at home due to moving around quicker but does walk around where/when he can daily with his quad cane  PATIENT GOALS: Be able to put his clothes on easier ie) jacket.  OBJECTIVE:   HAND DOMINANCE: Right  ADLs: Overall ADLs: Previously got assistance with LB ADLs and had a 'nurse' 5x/week x 3 hours Transfers/ambulation  related to ADLs: MI with quad cane, uses WC to get around quicker at home Eating: Family cuts his food  Grooming: Patient shaves himself with clippers  UB Dressing: Shirt sometimes gets caught on his shoulder and he has trouble with a jacket LB Dressing: Can do underwear and pants & R sock/shoe and L shoe but can't do L sock  Toileting: MI with quad cane Bathing: Can bathe UB and legs but can't wash his feet and R arm; has brush to clean his back, sits on BSC at bedside to wash his bottom  Tub Shower transfers: NA Equipment: bed side commode, Reacher, and Long handled sponge  IADLs: Shopping: Spouse Light housekeeping: Spouse/Nurse Meal Prep: Did not ask Community mobility: Drives himself ie) to therapy today Medication management: Did not ask Financial management: Did not ask Handwriting: 100% legible - R handed  MOBILITY STATUS: Needs Assist: unable to ambulate quickly for toielitng  etc, Hx of falls, difficulty with turns, difficulty carrying objections with ambulation, and quad cane  POSTURE COMMENTS:  weight shift right Sitting balance: WFL  ACTIVITY TOLERANCE: Activity tolerance: Seated good, standing/gait limited  FUNCTIONAL OUTCOME MEASURES: TBD  UPPER EXTREMITY ROM:    Active ROM Right eval Left eval  Shoulder flexion WFL AROM poor except for hand grip.  PROM Stiff for elbow, wrist and digital extension and L shoulder limited d/t subluxation stiffness & discomfort.  Shoulder abduction    Shoulder adduction    Shoulder extension    Shoulder internal rotation    Shoulder external rotation    Elbow flexion    Elbow extension    Wrist flexion    Wrist extension    Wrist ulnar deviation    Wrist radial deviation    Wrist pronation    Wrist supination    (Blank rows = not tested)  UPPER EXTREMITY MMT:     MMT Right eval Left eval  Shoulder flexion 5/5 0-1/5 - minimal flickers of muscle movement noted except for digits ie) able to squeeze functionally but can not open digits.   Digital flexion 3/5  Shoulder abduction    Shoulder adduction    Shoulder extension    Shoulder internal rotation    Shoulder external rotation    Middle trapezius    Lower trapezius    Elbow flexion    Elbow extension    Wrist flexion    Wrist extension    Wrist ulnar deviation    Wrist radial deviation    Wrist pronation    Wrist supination    (Blank rows = not tested)  HAND FUNCTION: R UE WFL; L UE Poor - can grasp item placed in hand but can not let go and can not move limb to pick up an item  COORDINATION: R UE WFL; L UE NA/poor  SENSATION: R UE WFL; L UE impaired  EDEMA: R UE NA; L UE slight  MUSCLE TONE: LUE: Moderate, Flaccid, and Shoulder subluxation 1-2 fingers widths and hand gets fisted and is difficulty to open ie) at night and then his hands hurts in the morning  COGNITION: Overall cognitive status: Impaired - Extra time needed to  follow directions and answer questions at times.  VISION ASSESSMENT: Not tested  PERCEPTION: NT  PRAXIS: NT  Evaluation OBSERVATIONS: Patient is a tall black male with significant left hemiplegia with some shoulder subluxation of L shoulder and significant unwashed skin in various areas ie) particularly L dorsal hand and pt shown how to scrub and clean area  more thoroughly.                                                                                                                           TREATMENT DATE:    Self Care: Pt noted to have unwashed skin of hands and time spent educating and assisting pt with cleaning areas more thoroughly.  Pt did not realize area was discolored due to old skin and emphasized importance of scrubbing area which also helped extend his digits more ie) when placing his hand on table top and scrubbing dorsal aspect of hand for cleaning puposes.  PATIENT EDUCATION: Education details: OT role, POC considerations and self care recommendations Person educated: Patient Education method: Explanation, Demonstration, Tactile cues, and Verbal cues Education comprehension: verbalized understanding and needs further education  HOME EXERCISE PROGRAM: 06/17/23: Table Top Slides - Access Code: [email or web address]  06/24/23: Additional Table Slides for Abduction/Scaption - same access code 07/01/23: Additional Dowel (Cane) AAROM -Flexion/abduction - same access code 07/08/23: Additional Dowel (Cane) AAROM - Scaption - same code 08/12/23: Additional Table top arm slides - same code   GOALS: Goals reviewed with patient? Yes  LONG TERM GOALS: Target date: 02/05/24  Pt  will be independent with HEP for L UE hemiplegia to work on shoulder approximation and comfort. Baseline: 1-2 finger width shoulder subluxation and pain with ambulation. Goal status: INITIAL  2.  Pt will demonstrate hemi-dressing techniques to more easily donning T-shirt/jacket and L  sock. Baseline: Pt self-reports difficulty with these dressing tasks. Goal status: INITIAL  3.  Pt will have information re: hemi-sling options to help with shoulder positioning for improved comfort with gait.  Baseline: Previously used sling but it pulled on his neck. Goal status: In Progress - 07/08/23 - handout provided but pt does not have it anymore  4.  Pt will demonstrate LUE movement 2-/5 for shoulder and elbow. Baseline: 0-1/5 Goal status: INITIAL  ASSESSMENT:  CLINICAL IMPRESSION: Patient is a 48 y.o. male who was seen today for occupational therapy evaluation for L hemiplegia and recent receipt of Botox  to L arm.  Pt previously seen by this therapist for L UE splinting needs last year.  Patient will benefit from skilled occupational therapy services to address HEP needs, assist with possible hemi-sling considerations and hemi-dressing techniques for dressing and recommendations for adaptive equipment considerations.   PERFORMANCE DEFICITS: in functional skills including ADLs, IADLs, coordination, dexterity, sensation, tone, ROM, strength, pain, muscle spasms, flexibility, Fine motor control, Gross motor control, skin integrity, and UE functional use, cognitive skills including learn and understand, and psychosocial skills including routines and behaviors.    IMPAIRMENTS: are limiting patient from ADLs, rest and sleep, leisure, and social participation.    CO-MORBIDITIES: may have co-morbidities  that affects occupational performance. Patient will benefit from skilled OT to address above impairments and improve overall function.   MODIFICATION OR ASSISTANCE TO COMPLETE EVALUATION: No modification of tasks or assist necessary to complete an evaluation.  OT OCCUPATIONAL PROFILE AND HISTORY: Problem focused assessment: Including review of records relating to presenting problem.   CLINICAL DECISION MAKING: LOW - limited treatment options, no task modification necessary   REHAB  POTENTIAL: Good  EVALUATION COMPLEXITY: Low    PLAN:  OT FREQUENCY: 1x/week  OT DURATION: up to 8 weeks - currently only scheduled for 4 weeks  PLANNED INTERVENTIONS: 97535 self care/ADL training, 16109 therapeutic exercise, 97530 therapeutic activity, 97112 neuromuscular re-education, 97140 manual therapy, [id number] aquatic therapy, 97760 Orthotics management and training, 60454 Splinting (initial encounter), [id number] Subsequent splinting/medication, passive range of motion, coping strategies training, patient/family education, and DME and/or AE instructions  RECOMMENDED OTHER SERVICES: NA  CONSULTED AND AGREED WITH PLAN OF CARE: Patient  PLAN FOR NEXT SESSION:  Hemi-dressing techniques LUE HEP Hemi-sling considerations for mobility   Zora Hires, OT 01/04/2024, 3:30 PM

## 2024-01-05 ENCOUNTER — Telehealth: Payer: Self-pay

## 2024-01-05 NOTE — Telephone Encounter (Signed)
Spoke with [facility] associate , made PCP aware referral needs to be placed  Copied from CRM [id number]. Topic: General - Other >> Jan 04, 2024  3:43 PM Melissa C wrote: Reason for CRM: patient called last week regarding authorization for release of home health aide and he is just checking up on that   Please contact: [facility] Agency- Phone number [phone] Fax number [phone] >> Jan 05, 2024 11:59 AM Sim Boast F wrote: Patient called to follow up on this, I did tell him that a voicemail was left with [facility] and are waiting for call back. Please reach out to [facility] again

## 2024-01-05 NOTE — Telephone Encounter (Signed)
Spoke with [facility] associate  Copied from CRM [id number]. Topic: General - Other >> Jan 05, 2024  2:09 PM Turkey A wrote: Reason for CRM: Patient called because he was holding for [facility] and was disconnected. Agent called [facility]

## 2024-01-05 NOTE — Telephone Encounter (Signed)
Spoke with Matthew Black , pt needs another referral placed for home health services

## 2024-01-07 NOTE — Addendum Note (Signed)
Addended by: Maximino Sarin on: 01/07/2024 08:17 AM   Modules accepted: Orders

## 2024-01-07 NOTE — Telephone Encounter (Signed)
Referral placed in orders , for you to sign and diagnose

## 2024-01-10 NOTE — Addendum Note (Signed)
Addended by: Gwenevere Abbot on: 01/10/2024 08:37 AM   Modules accepted: Orders

## 2024-01-11 ENCOUNTER — Ambulatory Visit: Payer: Medicare Other | Admitting: Occupational Therapy

## 2024-01-11 DIAGNOSIS — R252 Cramp and spasm: Secondary | ICD-10-CM | POA: Diagnosis not present

## 2024-01-11 DIAGNOSIS — I69354 Hemiplegia and hemiparesis following cerebral infarction affecting left non-dominant side: Secondary | ICD-10-CM | POA: Diagnosis not present

## 2024-01-11 DIAGNOSIS — M6281 Muscle weakness (generalized): Secondary | ICD-10-CM | POA: Diagnosis not present

## 2024-01-11 DIAGNOSIS — M24542 Contracture, left hand: Secondary | ICD-10-CM | POA: Diagnosis not present

## 2024-01-11 DIAGNOSIS — R2689 Other abnormalities of gait and mobility: Secondary | ICD-10-CM | POA: Diagnosis not present

## 2024-01-11 DIAGNOSIS — R278 Other lack of coordination: Secondary | ICD-10-CM | POA: Diagnosis not present

## 2024-01-11 DIAGNOSIS — R29818 Other symptoms and signs involving the nervous system: Secondary | ICD-10-CM | POA: Diagnosis not present

## 2024-01-11 DIAGNOSIS — Z741 Need for assistance with personal care: Secondary | ICD-10-CM

## 2024-01-11 NOTE — Therapy (Unsigned)
OUTPATIENT OCCUPATIONAL THERAPY NEURO TREATMENT  Patient Name: Matthew Black MRN: [id number] DOB:02/24/1976, 48 y.o., male Today's Date: 01/11/2024  PCP: Esperanza Richters, PA-C REFERRING PROVIDER: Horton Chin, MD  END OF SESSION:  OT End of Session - 01/11/24 1228     Visit Number 2    Number of Visits 8    Date for OT Re-Evaluation 03/04/24    Authorization Type [employer] Medicare 2025 VL: [place] Auth Reqd: [employer]    OT Start Time 1230    OT Stop Time 1314    OT Time Calculation (min) 44 min    Equipment Utilized During Treatment Personal socks/shoes and AFO    Activity Tolerance Patient tolerated treatment well    Behavior During Therapy WFL for tasks assessed/performed             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   Stroke (HCC)    RICA, R A1, R MCA occlusion 07/19/19   Past Surgical History:  Procedure Laterality Date   CRANIOPLASTY Right 06/05/2020   Procedure: CRANIOPLASTY;  Surgeon: Lisbeth Renshaw, MD;  Location: [facility] OR;  Service: Neurosurgery;  Laterality: Right;  right   CRANIOTOMY Right 07/19/2019   Procedure: RIGHT HEMI-CRANIECTOMY With implantation of skull flap to abdominal wall;  Surgeon: Lisbeth Renshaw, MD;  Location: [facility] OR;  Service: Neurosurgery;  Laterality: Right;   CYST EXCISION N/A 10/08/2016   Procedure: EXCISION OF POSTERIOR NECK CYST;  Surgeon: Berna Bue, MD;  Location: [facility] ORS;  Service: General;  Laterality: N/A;   CYSTOSCOPY/URETEROSCOPY/HOLMIUM LASER/STENT PLACEMENT Right 03/16/2020   Procedure: CYSTOSCOPY RIGHT RETROGRADE  PYELOGRAM URETEROSCOPY/HOLMIUM LASER/STENT PLACEMENT;  Surgeon: Crista Elliot, MD;  Location: [facility];  Service: Urology;  Laterality: Right;   INCISION AND DRAINAGE ABSCESS N/A 09/22/2014   Procedure: INCISION AND DRAINAGE ABSCESS POSTERIOR NECK;  Surgeon: Valarie Merino, MD;  Location: [facility];  Service: General;  Laterality: N/A;   INCISION AND DRAINAGE ABSCESS N/A 12/20/2015   Procedure: INCISION AND DRAINAGE POSTERIOR NECK MASS;  Surgeon: Darnell Level, MD;  Location: [facility];  Service: General;  Laterality: N/A;   INCISION AND DRAINAGE ABSCESS Left 07/10/2004   middle finger   IR IVC FILTER PLMT / S&I /IMG GUID/MOD SED  07/21/2019   IR RADIOLOGIST EVAL & MGMT  12/14/2019   IR RADIOLOGIST EVAL & MGMT  12/21/2020   IR VENOGRAM RENAL UNI RIGHT  07/21/2019   MASS EXCISION N/A 07/21/2017   Procedure: EXCISION OF BENIGN NECK LESION WITH LAYERED CLOSURE;  Surgeon: Glenna Fellows, MD;  Location: [facility];  Service: Plastics;  Laterality: N/A;   MASS EXCISION N/A 11/10/2017   Procedure: EXCISION BENIGN LESION OF THE NECK WITH LAYERED CLOSURE;  Surgeon: Glenna Fellows, MD;  Location: [facility];  Service: Plastics;  Laterality: N/A;   Patient Active Problem List   Diagnosis Date Noted   Hx of adenomatous colonic polyps 06/01/2023   COVID-19 virus infection 04/21/2021   Left-sided weakness 04/21/2021   S/P craniotomy 06/05/2020  History of cranioplasty 06/05/2020   Peri-rectal abscess 02/14/2020   Abnormal CT scan, pelvis 02/14/2020   Pancytopenia (HCC) 12/02/2019   Nephrolithiasis 12/02/2019   Hydronephrosis with renal and ureteral calculus obstruction 12/02/2019   Rectal pain 12/02/2019   Hyperkalemia 12/02/2019   Reactive depression    Wound infection after surgery    Sleep disturbance    Dysphagia, post-stroke    Transaminitis    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   Cerebral abscess    Urinary tract infection without hematuria     Altered mental status    Primary hypercoagulable state (HCC)    Acute pulmonary embolism without acute cor pulmonale (HCC)    Deep vein thrombosis (DVT) of non-extremity vein    Hypokalemia    Acute blood loss anemia    Leukocytosis    Endotracheal tube present    Acute respiratory failure with hypoxemia (HCC)    Stroke (cerebrum) (HCC) 07/19/2019   Pressure injury of skin 07/19/2019   Acute CVA (cerebrovascular accident) (HCC)    Encephalopathy    Dysphagia    Acute encephalopathy    Essential hypertension    Obesity 03/12/2016   Scalp abscess 12/20/2015   Neck abscess 12/20/2015   Pilonidal cyst 02/08/2013    ONSET DATE: 12/22/2023  REFERRING DIAG: R25.2 (ICD-10-CM) - Spasticity  THERAPY DIAG:  Other lack of coordination  Muscle weakness (generalized)  Hemiplegia and hemiparesis following cerebral infarction affecting left non-dominant side (HCC)  Contracture, left hand  Need for assistance with personal care  Rationale for Evaluation and Treatment: Rehabilitation  SUBJECTIVE:  See tx note below  PERTINENT HISTORY: Botox injection for muscle spasticity in L pectoralis 12/22/23  Presented 07/19/2019 with L sided weakness with diagnosis of Right middle cerebral artery stroke.  Patient underwent decompressive right hemicraniectomy with abdominal flap implant 07/19/2019 Inpatient Rehab: 09/06/2019 - 10/07/2019  PMHx: HTN, cranioplasty, encephalopathy  PRECAUTIONS: None  WEIGHT BEARING RESTRICTIONS: No  LIVING ENVIRONMENT:   Lives with: lives with spouse & children (23 yo daughter/14 yo son)  Patient previously had nurse that helped with bathing M-F x 3 hours/day but he reports not having this assistance in several months.  Shower is upstairs so he generally only sponge bathes.  Lives in: House Stairs: yes 3 steps to enter with rail on the R side entering; full flight of stairs (17 steps) indoors to get upstairs to shower but no longer goes up there s/p previous  fall 2+ years ago Has following equipment at home: Wheelchair (manual) and hospital bed; [facility] for bathing at bedside but goes to the bathroom to toilet; quad cane  PLOF: Prior to CVA - Independent with basic ADLs, Independent with household mobility without device, Independent with transfers,  Most recent - Needs assistance with ADLs, and Needs assistance with homemaking Patient was spending more time in [facility] at home due to moving around quicker but does walk around where/when he can daily with his quad cane  PATIENT GOALS: Be able to put his clothes on easier ie) jacket.  OBJECTIVE:   HAND DOMINANCE: Right  ADLs: Overall ADLs: Previously got assistance with LB ADLs and had a 'nurse' 5x/week x 3 hours Transfers/ambulation related to ADLs: MI with quad cane, uses WC to get around quicker at home Eating: Family cuts his food  Grooming: Patient shaves himself with clippers  UB Dressing: Shirt sometimes gets caught on his shoulder and he has trouble with a jacket LB Dressing: Can do underwear and pants & R sock/shoe  and L shoe but can't do L sock  Toileting: MI with quad cane Bathing: Can bathe UB and legs but can't wash his feet and R arm; has brush to clean his back, sits on BSC at bedside to wash his bottom  Tub Shower transfers: NA Equipment: bed side commode, Reacher, and Long handled sponge  IADLs: Shopping: Spouse Light housekeeping: Spouse/Nurse Meal Prep: Did not ask Community mobility: Drives himself ie) to therapy today Medication management: Did not ask Financial management: Did not ask Handwriting: 100% legible - R handed  MOBILITY STATUS: Needs Assist: unable to ambulate quickly for toielitng etc, Hx of falls, difficulty with turns, difficulty carrying objections with ambulation, and quad cane  POSTURE COMMENTS:  weight shift right Sitting balance: WFL  ACTIVITY TOLERANCE: Activity tolerance: Seated good, standing/gait limited  FUNCTIONAL OUTCOME  MEASURES: TBD  UPPER EXTREMITY ROM:    Active ROM Right eval Left eval  Shoulder flexion WFL AROM poor except for hand grip.  PROM Stiff for elbow, wrist and digital extension and L shoulder limited d/t subluxation stiffness & discomfort.  Shoulder abduction    Shoulder adduction    Shoulder extension    Shoulder internal rotation    Shoulder external rotation    Elbow flexion    Elbow extension    Wrist flexion    Wrist extension    Wrist ulnar deviation    Wrist radial deviation    Wrist pronation    Wrist supination    (Blank rows = not tested)  UPPER EXTREMITY MMT:     MMT Right eval Left eval  Shoulder flexion 5/5 0-1/5 - minimal flickers of muscle movement noted except for digits ie) able to squeeze functionally but can not open digits.   Digital flexion 3/5  Shoulder abduction    Shoulder adduction    Shoulder extension    Shoulder internal rotation    Shoulder external rotation    Middle trapezius    Lower trapezius    Elbow flexion    Elbow extension    Wrist flexion    Wrist extension    Wrist ulnar deviation    Wrist radial deviation    Wrist pronation    Wrist supination    (Blank rows = not tested)  HAND FUNCTION: R UE WFL; L UE Poor - can grasp item placed in hand but can not let go and can not move limb to pick up an item  COORDINATION: R UE WFL; L UE NA/poor  SENSATION: R UE WFL; L UE impaired  EDEMA: R UE NA; L UE slight  MUSCLE TONE: LUE: Moderate, Flaccid, and Shoulder subluxation 1-2 fingers widths and hand gets fisted and is difficulty to open ie) at night and then his hands hurts in the morning  COGNITION: Overall cognitive status: Impaired - Extra time needed to follow directions and answer questions at times.  VISION ASSESSMENT: Not tested  PERCEPTION: NT  PRAXIS: NT  Evaluation OBSERVATIONS: Patient is a tall black male with significant left hemiplegia with some shoulder subluxation of L shoulder and significant  unwashed skin in various areas ie) particularly L dorsal hand and pt shown how to scrub and clean area more thoroughly.  GOALS: Goals reviewed with patient? Yes  LONG TERM GOALS: Target date: 02/05/24  Pt  will be independent with HEP for L UE hemiplegia to work on shoulder approximation and comfort. Baseline: 1-2 finger width shoulder subluxation and pain with ambulation. Goal status: IN Progress  2.  Pt will demonstrate hemi-dressing techniques to more easily donning T-shirt/jacket and L sock. Baseline: Pt self-reports difficulty with these dressing tasks. Goal status: IN Progress  3.  Pt will have information re: hemi-sling options to help with shoulder positioning for improved comfort with gait.  Baseline: Previously used sling but it pulled on his neck. Goal status: In Progress - 07/08/23 - handout provided but pt does not have it anymore  4.  Pt will demonstrate LUE movement 2-/5 for shoulder and elbow. Baseline: 0-1/5 Goal status: IN Progress  ASSESSMENT:   PERFORMANCE DEFICITS: in functional skills including ADLs, IADLs, coordination, dexterity, sensation, tone, ROM, strength, pain, muscle spasms, flexibility, Fine motor control, Gross motor control, skin integrity, and UE functional use, cognitive skills including learn and understand, and psychosocial skills including routines and behaviors.    IMPAIRMENTS: are limiting patient from ADLs, rest and sleep, leisure, and social participation.    CO-MORBIDITIES: may have co-morbidities  that affects occupational performance. Patient will benefit from skilled OT to address above impairments and improve overall function.   REHAB POTENTIAL: Good  PLAN:  OT FREQUENCY: 1x/week  OT DURATION: up to 8 weeks - currently only scheduled for 4 weeks  PLANNED INTERVENTIONS: 97535 self care/ADL training, 16109 therapeutic  exercise, 97530 therapeutic activity, 97112 neuromuscular re-education, 97140 manual therapy, 97113 aquatic therapy, 97760 Orthotics management and training, 60454 Splinting (initial encounter), [phone] Subsequent splinting/medication, passive range of motion, coping strategies training, patient/family education, and DME and/or AE instructions  RECOMMENDED OTHER SERVICES: NA  CONSULTED AND AGREED WITH PLAN OF CARE: Patient   TODAY's TREATMENT: 01/11/24  SUBJECTIVE STATEMENT: Pt arrived with his splint, new shoes and socks for therapy activities today.    Pt had Botox 12/22/23 and is scheduled again 03/12/24.  Pt accompanied by: self  PAIN:  Are you having pain? N/A UE Pt reports some severe pain in LLE due to brace for several months.  FALLS: Has patient fallen in last 6 months? No  OBJECTIVE TREATMENT:    Orthotic care: OT assisted pt with improved comfort and fit of resting hand splint for LUE and LLE foot brace.  Provided education re: care of hand splint including option of removal of splint cover to wash in the washing machine and air dry.  Education provided on matching straps to colored Velcro hook with the 2 blues straps fastened comfortably and left hooked together.  Minor adjustments made to finger separator position to maximize appropriate digital position. Re: LE, OTR adjusted padded straps to improve comfort across his ankle and calf region and minimize excess pressure by the non-padded velcro hook strap.  Self Care: Pt noted to have good improvement in skin integrity of L hand today. Pt engaged in dressing activities for LUE with sock and shoe.  Trialled pillow case trick to slide sock on and then sock aide with some success with sock aide, although it was a hard aide and narrow for his foot.  He may benefit from a soft aide that would slide around his foot better.  Pt also engaged in donning shoe with foot funnel in new shoe with mod to max cues/assist to get the correct angle  to push his foot in.  OTR assisted  to lace the curly shoelaces in shoes for increased ease with sliding his feet into his shoes but with the shoe being new, he had some difficulty sliding his foot in completely on his own.  Another issue affecting ease for pt where the height of the table (which was bit taller than his bed at home and therefore he felt less comfortable leaning forward).  Sitting in a chair did not allow pt to pull his knee up  ie) across the edge of the table rather than crossing his legs which was more uncomfortable ie) getting his foot to his opposite knee.  PATIENT EDUCATION: Education details: self care/hemi dressing techniques and AE considerations Person educated: Patient Education method: Explanation, Demonstration, Tactile cues, and Verbal cues Education comprehension: verbalized understanding and needs further education  HOME EXERCISE PROGRAM: 06/17/23: Table Top Slides - Access Code: [email or web address]  06/24/23: Additional Table Slides for Abduction/Scaption - same access code 07/01/23: Additional Dowel (Cane) AAROM -Flexion/abduction - same access code 07/08/23: Additional Dowel (Cane) AAROM - Scaption - same code 08/12/23: Additional Table top arm slides - same code  CLINICAL IMPRESSION: Patient is a 48 y.o. male who was seen today for occupational therapy treatment for L hemiplegia and recent receipt of Botox to L arm.  Pt engaged in hemi-dressing techniques with AE trials with new shoes and socks.  Patient will benefit from skilled occupational therapy services to address further AE needs and hemi-dressing techniques as well as HEP needs,and  possible hemi-sling consideration.    PLAN FOR NEXT SESSION:  Continue Hemi-dressing techniques LUE HEP Hemi-sling considerations for mobility   Victorino Sparrow, OT 01/11/2024, 5:28 PM

## 2024-01-12 ENCOUNTER — Telehealth: Payer: Self-pay | Admitting: Medical

## 2024-01-12 NOTE — Telephone Encounter (Signed)
Patient is wanting a call back from someone in the office name Matthew Black patient did not say hat this was in regards to

## 2024-01-14 ENCOUNTER — Other Ambulatory Visit: Payer: Self-pay

## 2024-01-14 DIAGNOSIS — I10 Essential (primary) hypertension: Secondary | ICD-10-CM

## 2024-01-14 MED ORDER — ATORVASTATIN CALCIUM 10 MG PO TABS: 10.0000 mg | ORAL_TABLET | Freq: Every day | ORAL | 3 refills | Status: DC

## 2024-01-15 ENCOUNTER — Other Ambulatory Visit: Payer: Self-pay | Admitting: Medical

## 2024-01-18 ENCOUNTER — Ambulatory Visit: Payer: Medicare Other | Admitting: Occupational Therapy

## 2024-01-18 DIAGNOSIS — R2689 Other abnormalities of gait and mobility: Secondary | ICD-10-CM | POA: Diagnosis not present

## 2024-01-18 DIAGNOSIS — M6281 Muscle weakness (generalized): Secondary | ICD-10-CM

## 2024-01-18 DIAGNOSIS — R278 Other lack of coordination: Secondary | ICD-10-CM

## 2024-01-18 DIAGNOSIS — M24542 Contracture, left hand: Secondary | ICD-10-CM | POA: Diagnosis not present

## 2024-01-18 DIAGNOSIS — R29818 Other symptoms and signs involving the nervous system: Secondary | ICD-10-CM | POA: Diagnosis not present

## 2024-01-18 DIAGNOSIS — R252 Cramp and spasm: Secondary | ICD-10-CM | POA: Diagnosis not present

## 2024-01-18 DIAGNOSIS — I69354 Hemiplegia and hemiparesis following cerebral infarction affecting left non-dominant side: Secondary | ICD-10-CM | POA: Diagnosis not present

## 2024-01-18 DIAGNOSIS — Z741 Need for assistance with personal care: Secondary | ICD-10-CM | POA: Diagnosis not present

## 2024-01-18 NOTE — Therapy (Unsigned)
 OUTPATIENT OCCUPATIONAL THERAPY NEURO TREATMENT  Patient Name: Matthew Black MRN: [id number] DOB:[birth year], 48 y.o., male Today's Date: 01/18/2024  PCP: Esperanza Richters, PA-C REFERRING PROVIDER: Horton Chin, MD  END OF SESSION:  OT End of Session - 01/18/24 1322     Visit Number 3    Number of Visits 8    Date for OT Re-Evaluation 03/04/24    Authorization Type [employer] 2025 VL: [place]    Authorization Time Period Auth# [id number] 01/04/24 - 02/29/24    Authorization - Visit Number 3    Authorization - Number of Visits 9    OT Start Time 1317    OT Stop Time 1400    OT Time Calculation (min) 43 min    Equipment Utilized During Treatment Personal clothing    Activity Tolerance Patient tolerated treatment well    Behavior During Therapy WFL for tasks assessed/performed             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   Stroke (HCC)    RICA, R A1, R MCA occlusion 07/19/19   Past Surgical History:  Procedure Laterality Date   CRANIOPLASTY Right 06/05/2020   Procedure: CRANIOPLASTY;  Surgeon: Lisbeth Renshaw, MD;  Location: [facility] OR;  Service: Neurosurgery;  Laterality: Right;  right   CRANIOTOMY Right 07/19/2019   Procedure: RIGHT HEMI-CRANIECTOMY With implantation of skull flap to abdominal wall;  Surgeon: Lisbeth Renshaw, MD;  Location: [facility] OR;  Service: Neurosurgery;  Laterality: Right;   CYST EXCISION N/A 10/08/2016   Procedure: EXCISION OF POSTERIOR NECK CYST;  Surgeon: Berna Bue, MD;  Location: [facility] ORS;  Service: General;  Laterality: N/A;    CYSTOSCOPY/URETEROSCOPY/HOLMIUM LASER/STENT PLACEMENT Right 03/16/2020   Procedure: CYSTOSCOPY RIGHT RETROGRADE PYELOGRAM URETEROSCOPY/HOLMIUM LASER/STENT PLACEMENT;  Surgeon: Crista Elliot, MD;  Location: [facility];  Service: Urology;  Laterality: Right;   INCISION AND DRAINAGE ABSCESS N/A 09/22/2014   Procedure: INCISION AND DRAINAGE ABSCESS POSTERIOR NECK;  Surgeon: Valarie Merino, MD;  Location: [facility];  Service: General;  Laterality: N/A;   INCISION AND DRAINAGE ABSCESS N/A 12/20/2015   Procedure: INCISION AND DRAINAGE POSTERIOR NECK MASS;  Surgeon: Darnell Level, MD;  Location: [facility];  Service: General;  Laterality: N/A;   INCISION AND DRAINAGE ABSCESS Left 07/10/2004   middle finger   IR IVC FILTER PLMT / S&I /IMG GUID/MOD SED  07/21/2019   IR RADIOLOGIST EVAL & MGMT  12/14/2019   IR RADIOLOGIST EVAL & MGMT  12/21/2020   IR VENOGRAM RENAL UNI RIGHT  07/21/2019   MASS EXCISION N/A 07/21/2017   Procedure: EXCISION OF BENIGN NECK LESION WITH LAYERED CLOSURE;  Surgeon: Glenna Fellows, MD;  Location: [facility];  Service: Plastics;  Laterality: N/A;   MASS EXCISION N/A 11/10/2017   Procedure: EXCISION BENIGN LESION OF THE NECK WITH LAYERED CLOSURE;  Surgeon: Glenna Fellows, MD;  Location: [facility];  Service: Plastics;  Laterality: N/A;   Patient Active Problem List   Diagnosis Date Noted   Hx  of adenomatous colonic polyps 06/01/2023   COVID-19 virus infection 04/21/2021   Left-sided weakness 04/21/2021   S/P craniotomy 06/05/2020   History of cranioplasty 06/05/2020   Peri-rectal abscess 02/14/2020   Abnormal CT scan, pelvis 02/14/2020   Pancytopenia (HCC) 12/02/2019   Nephrolithiasis 12/02/2019   Hydronephrosis with renal and ureteral calculus obstruction 12/02/2019   Rectal pain 12/02/2019   Hyperkalemia 12/02/2019   Reactive depression    Wound infection after surgery    Sleep disturbance    Dysphagia, post-stroke    Transaminitis    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   Cerebral abscess    Urinary tract infection without hematuria    Altered mental status    Primary hypercoagulable state (HCC)    Acute pulmonary embolism without acute cor pulmonale (HCC)    Deep vein thrombosis (DVT) of non-extremity vein    Hypokalemia    Acute blood loss anemia    Leukocytosis    Endotracheal tube present    Acute respiratory failure with hypoxemia (HCC)    Stroke (cerebrum) (HCC) 07/19/2019   Pressure injury of skin 07/19/2019   Acute CVA (cerebrovascular accident) (HCC)    Encephalopathy    Dysphagia    Acute encephalopathy    Essential hypertension    Obesity 03/12/2016   Scalp abscess 12/20/2015   Neck abscess 12/20/2015   Pilonidal cyst 02/08/2013    ONSET DATE: 12/22/2023  REFERRING DIAG: R25.2 (ICD-10-CM) - Spasticity  THERAPY DIAG:  Other lack of coordination  Muscle weakness (generalized)  Hemiplegia and hemiparesis following cerebral infarction affecting left non-dominant side (HCC)  Rationale for Evaluation and Treatment: Rehabilitation  SUBJECTIVE:  See tx note below  PERTINENT HISTORY: Botox injection for muscle spasticity in L pectoralis 12/22/23  Presented 07/19/2019 with L sided weakness with diagnosis of Right middle cerebral artery stroke.  Patient underwent decompressive right hemicraniectomy with abdominal flap implant 07/19/2019 Inpatient Rehab: 09/06/2019 - 10/07/2019  PMHx: HTN, cranioplasty, encephalopathy  PRECAUTIONS: None  WEIGHT BEARING RESTRICTIONS: No  LIVING ENVIRONMENT:   Lives with: lives with spouse & children (51 yo daughter/14 yo son)  Patient previously had nurse that helped with bathing M-F x 3 hours/day but he reports not having this assistance in several months.  Shower is upstairs so he generally only sponge bathes.  Lives in: House Stairs: yes 3 steps to enter with rail on the R side entering; full flight of stairs (17 steps) indoors to get upstairs to  shower but no longer goes up there s/p previous fall 2+ years ago Has following equipment at home: Wheelchair (manual) and hospital bed; [facility] for bathing at bedside but goes to the bathroom to toilet; quad cane  PLOF: Prior to CVA - Independent with basic ADLs, Independent with household mobility without device, Independent with transfers,  Most recent - Needs assistance with ADLs, and Needs assistance with homemaking Patient was spending more time in [facility] at home due to moving around quicker but does walk around where/when he can daily with his quad cane  PATIENT GOALS: Be able to put his clothes on easier ie) jacket.  OBJECTIVE:   HAND DOMINANCE: Right  ADLs: Overall ADLs: Previously got assistance with LB ADLs and had a 'nurse' 5x/week x 3 hours Transfers/ambulation related to ADLs: MI with quad cane, uses WC to get around quicker at home Eating: Family cuts his food  Grooming: Patient shaves himself with clippers  UB Dressing: Shirt sometimes gets caught on his shoulder and he has trouble with  a jacket LB Dressing: Can do underwear and pants & R sock/shoe and L shoe but can't do L sock  Toileting: MI with quad cane Bathing: Can bathe UB and legs but can't wash his feet and R arm; has brush to clean his back, sits on BSC at bedside to wash his bottom  Tub Shower transfers: NA Equipment: bed side commode, Reacher, and Long handled sponge  IADLs: Shopping: Spouse Light housekeeping: Spouse/Nurse Meal Prep: Did not ask Community mobility: Drives himself ie) to therapy today Medication management: Did not ask Financial management: Did not ask Handwriting: 100% legible - R handed  MOBILITY STATUS: Needs Assist: unable to ambulate quickly for toielitng etc, Hx of falls, difficulty with turns, difficulty carrying objections with ambulation, and quad cane  POSTURE COMMENTS:  weight shift right Sitting balance: WFL  ACTIVITY TOLERANCE: Activity tolerance: Seated good, standing/gait  limited  FUNCTIONAL OUTCOME MEASURES: TBD  UPPER EXTREMITY ROM:    Active ROM Right eval Left eval  Shoulder flexion WFL AROM poor except for hand grip.  PROM Stiff for elbow, wrist and digital extension and L shoulder limited d/t subluxation stiffness & discomfort.  Shoulder abduction    Shoulder adduction    Shoulder extension    Shoulder internal rotation    Shoulder external rotation    Elbow flexion    Elbow extension    Wrist flexion    Wrist extension    Wrist ulnar deviation    Wrist radial deviation    Wrist pronation    Wrist supination    (Blank rows = not tested)  UPPER EXTREMITY MMT:     MMT Right eval Left eval  Shoulder flexion 5/5 0-1/5 - minimal flickers of muscle movement noted except for digits ie) able to squeeze functionally but can not open digits.   Digital flexion 3/5  Shoulder abduction    Shoulder adduction    Shoulder extension    Shoulder internal rotation    Shoulder external rotation    Middle trapezius    Lower trapezius    Elbow flexion    Elbow extension    Wrist flexion    Wrist extension    Wrist ulnar deviation    Wrist radial deviation    Wrist pronation    Wrist supination    (Blank rows = not tested)  HAND FUNCTION: R UE WFL; L UE Poor - can grasp item placed in hand but can not let go and can not move limb to pick up an item  COORDINATION: R UE WFL; L UE NA/poor  SENSATION: R UE WFL; L UE impaired  EDEMA: R UE NA; L UE slight  MUSCLE TONE: LUE: Moderate, Flaccid, and Shoulder subluxation 1-2 fingers widths and hand gets fisted and is difficulty to open ie) at night and then his hands hurts in the morning  COGNITION: Overall cognitive status: Impaired - Extra time needed to follow directions and answer questions at times.  VISION ASSESSMENT: Not tested  PERCEPTION: NT  PRAXIS: NT  Evaluation OBSERVATIONS: Patient is a tall black male with significant left hemiplegia with some shoulder subluxation of L  shoulder and significant unwashed skin in various areas ie) particularly L dorsal hand and pt shown how to scrub and clean area more thoroughly.  GOALS: Goals reviewed with patient? Yes  LONG TERM GOALS: Target date: 02/05/24  Pt  will be independent with HEP for L UE hemiplegia to work on shoulder approximation and comfort. Baseline: 1-2 finger width shoulder subluxation and pain with ambulation. Goal status: IN Progress  2.  Pt will demonstrate hemi-dressing techniques to more easily donning T-shirt/jacket and L sock. Baseline: Pt self-reports difficulty with these dressing tasks. Goal status: IN Progress  3.  Pt will have information re: hemi-sling options to help with shoulder positioning for improved comfort with gait.  Baseline: Previously used sling but it pulled on his neck. Goal status: In Progress - 07/08/23 - handout provided but pt does not have it anymore  4.  Pt will demonstrate LUE movement 2-/5 for shoulder and elbow. Baseline: 0-1/5 Goal status: IN Progress  ASSESSMENT:   PERFORMANCE DEFICITS: in functional skills including ADLs, IADLs, coordination, dexterity, sensation, tone, ROM, strength, pain, muscle spasms, flexibility, Fine motor control, Gross motor control, skin integrity, and UE functional use, cognitive skills including learn and understand, and psychosocial skills including routines and behaviors.    IMPAIRMENTS: are limiting patient from ADLs, rest and sleep, leisure, and social participation.    CO-MORBIDITIES: may have co-morbidities  that affects occupational performance. Patient will benefit from skilled OT to address above impairments and improve overall function.   REHAB POTENTIAL: Good  PLAN:  OT FREQUENCY: 1x/week  OT DURATION: up to 8 weeks - currently only scheduled for 4 weeks  PLANNED INTERVENTIONS: 97535 self care/ADL  training, 16109 therapeutic exercise, 97530 therapeutic activity, 97112 neuromuscular re-education, 97140 manual therapy, [id number] aquatic therapy, 97760 Orthotics management and training, 60454 Splinting (initial encounter), [phone] Subsequent splinting/medication, passive range of motion, coping strategies training, patient/family education, and DME and/or AE instructions  RECOMMENDED OTHER SERVICES: NA  CONSULTED AND AGREED WITH PLAN OF CARE: Patient   TODAY's TREATMENT: 01/11/24  SUBJECTIVE STATEMENT: Pt reported he had some chest pain but he can rub it out - around his pecs/sternum.   BP: 108/74  Pt had Botox 12/22/23 and is scheduled again 03/12/24.  Pt accompanied by: self  PAIN:  Are you having pain? N/A UE 5/10 in chest but when he rubs it, he can rub it out. Pt reports some severe pain in LLE due to brace for several months.  FALLS: Has patient fallen in last 6 months? No  OBJECTIVE TREATMENT:    Self Care: Pt engaged in dressing activities for LUE with sock and shoe.     Doning jacket explored.... Trialled pillow case trick to slide sock on and then sock aide with some success with sock aide, although it was a hard aide and narrow for his foot.  He may benefit from a soft aide that would slide around his foot better.  Pt also engaged in donning shoe with foot funnel in new shoe with mod to max cues/assist to get the correct angle to push his foot in.  OTR assisted to lace the curly shoelaces in shoes for increased ease with sliding his feet into his shoes but with the shoe being new, he had some difficulty sliding his foot in completely on his own.  Another issue affecting ease for pt where the height of the table (which was bit taller than his bed at home and therefore he felt less comfortable leaning forward).  Sitting in a chair did not allow pt to pull his knee up  ie) across the edge of the table rather than crossing his legs which  was more uncomfortable ie) getting his foot to  his opposite knee.  PATIENT EDUCATION: Education details: self care/hemi dressing techniques and AE considerations Person educated: Patient Education method: Explanation, Demonstration, Tactile cues, and Verbal cues Education comprehension: verbalized understanding and needs further education  HOME EXERCISE PROGRAM: 06/17/23: Table Top Slides - Access Code: [email or web address]  06/24/23: Additional Table Slides for Abduction/Scaption - same access code 07/01/23: Additional Dowel (Cane) AAROM -Flexion/abduction - same access code 07/08/23: Additional Dowel (Cane) AAROM - Scaption - same code 08/12/23: Additional Table top arm slides - same code  CLINICAL IMPRESSION: Patient is a 48 y.o. male who was seen today for occupational therapy treatment for L hemiplegia and recent receipt of Botox to L arm.  Pt engaged in hemi-dressing techniques with AE trials with new shoes and socks.  Patient will benefit from skilled occupational therapy services to address further AE needs and hemi-dressing techniques as well as HEP needs,and  possible hemi-sling consideration.    PLAN FOR NEXT SESSION:  Continue Hemi-dressing techniques LUE HEP Hemi-sling considerations for mobility   Victorino Sparrow, OT 01/18/2024, 5:35 PM

## 2024-01-25 ENCOUNTER — Other Ambulatory Visit: Payer: Self-pay | Admitting: Physical Medicine and Rehabilitation

## 2024-01-25 ENCOUNTER — Ambulatory Visit: Payer: Medicare Other | Attending: Physical Medicine and Rehabilitation | Admitting: Occupational Therapy

## 2024-01-25 DIAGNOSIS — M24542 Contracture, left hand: Secondary | ICD-10-CM | POA: Diagnosis not present

## 2024-01-25 DIAGNOSIS — M25512 Pain in left shoulder: Secondary | ICD-10-CM | POA: Diagnosis not present

## 2024-01-25 DIAGNOSIS — R278 Other lack of coordination: Secondary | ICD-10-CM | POA: Diagnosis not present

## 2024-01-25 DIAGNOSIS — I10 Essential (primary) hypertension: Secondary | ICD-10-CM

## 2024-01-25 DIAGNOSIS — Z741 Need for assistance with personal care: Secondary | ICD-10-CM | POA: Insufficient documentation

## 2024-01-25 DIAGNOSIS — M6281 Muscle weakness (generalized): Secondary | ICD-10-CM

## 2024-01-25 DIAGNOSIS — G8929 Other chronic pain: Secondary | ICD-10-CM

## 2024-01-25 DIAGNOSIS — I69354 Hemiplegia and hemiparesis following cerebral infarction affecting left non-dominant side: Secondary | ICD-10-CM

## 2024-01-25 NOTE — Therapy (Addendum)
 OUTPATIENT OCCUPATIONAL THERAPY NEURO TREATMENT  Patient Name: Matthew Black MRN: [id number] DOB:1975/12/19, 48 y.o., male Today's Date: 01/25/2024  PCP: Esperanza Richters, PA-C REFERRING PROVIDER: Horton Chin, MD  END OF SESSION:  OT End of Session - 01/25/24 1331     Visit Number 4    Number of Visits 8    Date for OT Re-Evaluation 03/04/24    Authorization Type [employer] Medicare 2025 VL: [place]    Authorization Time Period Auth# [id number] 01/04/24 - 02/29/24    Authorization - Visit Number 4    Authorization - Number of Visits 9    OT Start Time 1330    OT Stop Time 1400    OT Time Calculation (min) 30 min    Equipment Utilized During Treatment Personal clothing    Activity Tolerance Patient tolerated treatment well    Behavior During Therapy [facility] for tasks assessed/performed             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   Stroke (HCC)    RICA, R A1, R MCA occlusion 07/19/19   Past Surgical History:  Procedure Laterality Date   CRANIOPLASTY Right 06/05/2020   Procedure: CRANIOPLASTY;  Surgeon: Lisbeth Renshaw, MD;  Location: [facility] OR;  Service: Neurosurgery;  Laterality: Right;  right   CRANIOTOMY Right 07/19/2019   Procedure: RIGHT HEMI-CRANIECTOMY With implantation of skull flap to abdominal wall;  Surgeon: Lisbeth Renshaw, MD;  Location: [facility] OR;  Service: Neurosurgery;  Laterality: Right;   CYST EXCISION N/A 10/08/2016   Procedure: EXCISION OF POSTERIOR NECK CYST;  Surgeon: Berna Bue, MD;  Location: [facility] ORS;  Service: General;  Laterality: N/A;    CYSTOSCOPY/URETEROSCOPY/HOLMIUM LASER/STENT PLACEMENT Right 03/16/2020   Procedure: CYSTOSCOPY RIGHT RETROGRADE PYELOGRAM URETEROSCOPY/HOLMIUM LASER/STENT PLACEMENT;  Surgeon: Crista Elliot, MD;  Location: [facility] ORS;  Service: Urology;  Laterality: Right;   INCISION AND DRAINAGE ABSCESS N/A 09/22/2014   Procedure: INCISION AND DRAINAGE ABSCESS POSTERIOR NECK;  Surgeon: Valarie Merino, MD;  Location: [facility] ORS;  Service: General;  Laterality: N/A;   INCISION AND DRAINAGE ABSCESS N/A 12/20/2015   Procedure: INCISION AND DRAINAGE POSTERIOR NECK MASS;  Surgeon: Darnell Level, MD;  Location: [facility] ORS;  Service: General;  Laterality: N/A;   INCISION AND DRAINAGE ABSCESS Left 07/10/2004   middle finger   IR IVC FILTER PLMT / S&I /IMG GUID/MOD SED  07/21/2019   IR RADIOLOGIST EVAL & MGMT  12/14/2019   IR RADIOLOGIST EVAL & MGMT  12/21/2020   IR VENOGRAM RENAL UNI RIGHT  07/21/2019   MASS EXCISION N/A 07/21/2017   Procedure: EXCISION OF BENIGN NECK LESION WITH LAYERED CLOSURE;  Surgeon: Glenna Fellows, MD;  Location: [facility];  Service: Plastics;  Laterality: N/A;   MASS EXCISION N/A 11/10/2017   Procedure: EXCISION BENIGN LESION OF THE NECK WITH LAYERED CLOSURE;  Surgeon: Glenna Fellows, MD;  Location:  SURGERY CENTER;  Service: Plastics;  Laterality: N/A;   Patient Active Problem List   Diagnosis Date Noted   Hx  of adenomatous colonic polyps 06/01/2023   COVID-19 virus infection 04/21/2021   Left-sided weakness 04/21/2021   S/P craniotomy 06/05/2020   History of cranioplasty 06/05/2020   Peri-rectal abscess 02/14/2020   Abnormal CT scan, pelvis 02/14/2020   Pancytopenia (HCC) 12/02/2019   Nephrolithiasis 12/02/2019   Hydronephrosis with renal and ureteral calculus obstruction 12/02/2019   Rectal pain 12/02/2019   Hyperkalemia 12/02/2019   Reactive depression    Wound infection after surgery    Sleep disturbance    Dysphagia, post-stroke    Transaminitis    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   Cerebral abscess    Urinary tract infection without hematuria    Altered mental status    Primary hypercoagulable state (HCC)    Acute pulmonary embolism without acute cor pulmonale (HCC)    Deep vein thrombosis (DVT) of non-extremity vein    Hypokalemia    Acute blood loss anemia    Leukocytosis    Endotracheal tube present    Acute respiratory failure with hypoxemia (HCC)    Stroke (cerebrum) (HCC) 07/19/2019   Pressure injury of skin 07/19/2019   Acute CVA (cerebrovascular accident) (HCC)    Encephalopathy    Dysphagia    Acute encephalopathy    Essential hypertension    Obesity 03/12/2016   Scalp abscess 12/20/2015   Neck abscess 12/20/2015   Pilonidal cyst 02/08/2013    ONSET DATE: 12/22/2023  REFERRING DIAG: R25.2 (ICD-10-CM) - Spasticity  THERAPY DIAG:  Muscle weakness (generalized)  Other lack of coordination  Hemiplegia and hemiparesis following cerebral infarction affecting left non-dominant side (HCC)  Chronic left shoulder pain  Rationale for Evaluation and Treatment: Rehabilitation  PERTINENT HISTORY: Botox injection for muscle spasticity in L pectoralis 12/22/23  Presented 07/19/2019 with L sided weakness with diagnosis of Right middle cerebral artery stroke.  Patient underwent decompressive right hemicraniectomy with abdominal flap implant 07/19/2019 Inpatient Rehab: 09/06/2019 - 10/07/2019  PMHx: HTN, cranioplasty, encephalopathy  PRECAUTIONS: None  WEIGHT BEARING RESTRICTIONS: No  LIVING ENVIRONMENT:   Lives with: lives with spouse & children (80 yo daughter/14 yo son)  Patient previously had nurse that helped with bathing M-F x 3 hours/day but he reports not having this assistance in several months.  Shower is upstairs so he generally only sponge bathes.  Lives in: House Stairs: yes 3 steps to enter with rail on the R side entering; full flight of stairs (17 steps) indoors to get upstairs to shower  but no longer goes up there s/p previous fall 2+ years ago Has following equipment at home: Wheelchair (manual) and hospital bed; [facility] for bathing at bedside but goes to the bathroom to toilet; quad cane  PLOF: Prior to CVA - Independent with basic ADLs, Independent with household mobility without device, Independent with transfers,  Most recent - Needs assistance with ADLs, and Needs assistance with homemaking Patient was spending more time in [facility] at home due to moving around quicker but does walk around where/when he can daily with his quad cane  PATIENT GOALS: Be able to put his clothes on easier ie) jacket.  OBJECTIVE:   HAND DOMINANCE: Right  ADLs: Overall ADLs: Previously got assistance with LB ADLs and had a 'nurse' 5x/week x 3 hours Transfers/ambulation related to ADLs: MI with quad cane, uses WC to get around quicker at home Eating: Family cuts his food  Grooming: Patient shaves himself with clippers  UB Dressing: Shirt sometimes gets caught on his shoulder and he has trouble with a jacket  LB Dressing: Can do underwear and pants & R sock/shoe and L shoe but can't do L sock  Toileting: MI with quad cane Bathing: Can bathe UB and legs but can't wash his feet and R arm; has brush to clean his back, sits on BSC at bedside to wash his bottom  Tub Shower transfers: NA Equipment: bed side commode, Reacher, and Long handled sponge  IADLs: Shopping: Spouse Light housekeeping: Spouse/Nurse Meal Prep: Did not ask Community mobility: Drives himself ie) to therapy today Medication management: Did not ask Financial management: Did not ask Handwriting: 100% legible - R handed  MOBILITY STATUS: Needs Assist: unable to ambulate quickly for toielitng etc, Hx of falls, difficulty with turns, difficulty carrying objections with ambulation, and quad cane  POSTURE COMMENTS:  weight shift right Sitting balance: WFL  ACTIVITY TOLERANCE: Activity tolerance: Seated good, standing/gait  limited  FUNCTIONAL OUTCOME MEASURES: TBD  UPPER EXTREMITY ROM:    Active ROM Right eval Left eval  Shoulder flexion WFL AROM poor except for hand grip.  PROM Stiff for elbow, wrist and digital extension and L shoulder limited d/t subluxation stiffness & discomfort.  Shoulder abduction    Shoulder adduction    Shoulder extension    Shoulder internal rotation    Shoulder external rotation    Elbow flexion    Elbow extension    Wrist flexion    Wrist extension    Wrist ulnar deviation    Wrist radial deviation    Wrist pronation    Wrist supination    (Blank rows = not tested)  UPPER EXTREMITY MMT:     MMT Right eval Left eval  Shoulder flexion 5/5 0-1/5 - minimal flickers of muscle movement noted except for digits ie) able to squeeze functionally but can not open digits.   Digital flexion 3/5  Shoulder abduction    Shoulder adduction    Shoulder extension    Shoulder internal rotation    Shoulder external rotation    Middle trapezius    Lower trapezius    Elbow flexion    Elbow extension    Wrist flexion    Wrist extension    Wrist ulnar deviation    Wrist radial deviation    Wrist pronation    Wrist supination    (Blank rows = not tested)  HAND FUNCTION: R UE WFL; L UE Poor - can grasp item placed in hand but can not let go and can not move limb to pick up an item  COORDINATION: R UE WFL; L UE NA/poor  SENSATION: R UE WFL; L UE impaired  EDEMA: R UE NA; L UE slight  MUSCLE TONE: LUE: Moderate, Flaccid, and Shoulder subluxation 1-2 fingers widths and hand gets fisted and is difficulty to open ie) at night and then his hands hurts in the morning  COGNITION: Overall cognitive status: Impaired - Extra time needed to follow directions and answer questions at times.  VISION ASSESSMENT: Not tested  PERCEPTION: NT  PRAXIS: NT  Evaluation OBSERVATIONS: Patient is a tall black male with significant left hemiplegia with some shoulder subluxation of L  shoulder and significant unwashed skin in various areas ie) particularly L dorsal hand and pt shown how to scrub and clean area more thoroughly.  GOALS: Goals reviewed with patient? Yes  LONG TERM GOALS: Target date: 02/05/24  Pt  will be independent with HEP for L UE hemiplegia to work on shoulder approximation and comfort. Baseline: 1-2 finger width shoulder subluxation and pain with ambulation. Goal status: IN Progress  2.  Pt will demonstrate hemi-dressing techniques to more easily donning T-shirt/jacket and L sock. Baseline: Pt self-reports difficulty with these dressing tasks. Goal status: IN Progress  3.  Pt will have information re: hemi-sling options to help with shoulder positioning for improved comfort with gait.  Baseline: Previously used sling but it pulled on his neck. Goal status: In Progress - 07/08/23 - handout provided but pt does not have it anymore  4.  Pt will demonstrate LUE movement 2-/5 for shoulder and elbow. Baseline: 0-1/5 Goal status: IN Progress  ASSESSMENT:   PERFORMANCE DEFICITS: in functional skills including ADLs, IADLs, coordination, dexterity, sensation, tone, ROM, strength, pain, muscle spasms, flexibility, Fine motor control, Gross motor control, skin integrity, and UE functional use, cognitive skills including learn and understand, and psychosocial skills including routines and behaviors.    IMPAIRMENTS: are limiting patient from ADLs, rest and sleep, leisure, and social participation.    CO-MORBIDITIES: may have co-morbidities  that affects occupational performance. Patient will benefit from skilled OT to address above impairments and improve overall function.   REHAB POTENTIAL: Good  PLAN:  OT FREQUENCY: 1x/week  OT DURATION: up to 8 weeks - currently only scheduled for 4 weeks  PLANNED INTERVENTIONS: 97535 self care/ADL  training, 52841 therapeutic exercise, 97530 therapeutic activity, 97112 neuromuscular re-education, 97140 manual therapy, 97113 aquatic therapy, 97760 Orthotics management and training, 32440 Splinting (initial encounter), [phone] Subsequent splinting/medication, passive range of motion, coping strategies training, patient/family education, and DME and/or AE instructions  RECOMMENDED OTHER SERVICES: NA  CONSULTED AND AGREED WITH PLAN OF CARE: Patient   TODAY's TREATMENT: 01/25/24  SUBJECTIVE STATEMENT: Pt reports he's been trying to walk without relying so heavily on his cane and was a bit out of breath upon arriving to therapy lobby.  Pt did not realize he was late and he apologized for being late.  He is awaiting further testing for recent chest pain complaints - MD to make referral to imaging and pt to make appt.   Pt reports his wife helped him get his new shoes on today.  Pt had Botox 12/22/23 and is scheduled again 03/12/24.  Pt accompanied by: self  PAIN:  Are you having pain? Some discomfort in shoulder that is relieved by shoulder approximation   FALLS: Has patient fallen in last 6 months? No  OBJECTIVE TREATMENT:    Orthotic Assessment: Attempted to have pt work on shoulder shrugs with limited success on L side today.  He does have relief from discomfort in neck/shoulder when humerus is approximated in joint space and OT reviewed hemi sling options with pt with simple, in expensive option found with pt asking ot have it emailed to him through [employer] -- [email or web address] "Shoulder Belt Support Arm Sling For Stroke Hemiplegia Subluxation Adjustable".  Pt reports improved comfort with arm supported and he is encouraged to prop the elbow up to push the humerus up into the shoulder joint.  Self Care: Pt engaged in UB dressing activities with hemi-dressing techniques with jacket again today.  Pt able to get his L hand in the sleeve in standing without going back up the sleeve with his  R hand but did report that method is working for him at home.  He  is shown how to ensure the entire jacket is on the left side of his arm before pulling it up past his elbow, which increases the ease of pulling it around behind his back.  He is able to get his R hand in with minimal difficulty with is encouraged to adjust the jacket in standing.  Pt engaged in practicing techniques with minor adjustments to get the L arm in the sleeve to begin with good success.  PATIENT EDUCATION: Education details: self care/hemi dressing techniques and AE considerations Person educated: Patient Education method: Explanation, Demonstration, Tactile cues, and Verbal cues Education comprehension: verbalized understanding and needs further education  HOME EXERCISE PROGRAM: 06/17/23: Table Top Slides - Access Code: [email or web address]  06/24/23: Additional Table Slides for Abduction/Scaption - same access code 07/01/23: Additional Dowel (Cane) AAROM -Flexion/abduction - same access code 07/08/23: Additional Dowel (Cane) AAROM - Scaption - same code 08/12/23: Additional Table top arm slides - same code  CLINICAL IMPRESSION: Patient is a 48 y.o. male who was seen today for occupational therapy treatment for L hemiplegia and recent receipt of Botox to L arm.  Pt engaged in hemi-dressing techniques with UB dressing.  Pt given several ideas to increase ease with dressing hemiplegic L UE.  Pt is looking into exploring hemi-sling option at home to help with comfort and support of arm during ambulation and will benefit from skilled occupational therapy services to address training with hemi-sling and further ADL strategies.   PLAN FOR NEXT SESSION:  Continue Hemi-dressing techniques and AE LUE HEP Hemi-sling considerations for mobility   Victorino Sparrow, OT 01/25/2024, 2:03 PM

## 2024-02-01 ENCOUNTER — Ambulatory Visit: Payer: Self-pay | Admitting: Occupational Therapy

## 2024-02-01 DIAGNOSIS — M6281 Muscle weakness (generalized): Secondary | ICD-10-CM | POA: Diagnosis not present

## 2024-02-01 DIAGNOSIS — Z741 Need for assistance with personal care: Secondary | ICD-10-CM | POA: Diagnosis not present

## 2024-02-01 DIAGNOSIS — I69354 Hemiplegia and hemiparesis following cerebral infarction affecting left non-dominant side: Secondary | ICD-10-CM | POA: Diagnosis not present

## 2024-02-01 DIAGNOSIS — G8929 Other chronic pain: Secondary | ICD-10-CM | POA: Diagnosis not present

## 2024-02-01 DIAGNOSIS — M24542 Contracture, left hand: Secondary | ICD-10-CM | POA: Diagnosis not present

## 2024-02-01 DIAGNOSIS — R278 Other lack of coordination: Secondary | ICD-10-CM

## 2024-02-01 DIAGNOSIS — M25512 Pain in left shoulder: Secondary | ICD-10-CM | POA: Diagnosis not present

## 2024-02-01 NOTE — Therapy (Unsigned)
 OUTPATIENT OCCUPATIONAL THERAPY NEURO TREATMENT  Patient Name: Matthew Black MRN: [id number] DOB:12-Oct-1976, 48 y.o., male Today's Date: 02/01/2024  PCP: Esperanza Richters, PA-C REFERRING PROVIDER: Horton Chin, MD  END OF SESSION:  OT End of Session - 02/01/24 1230     Visit Number 5    Number of Visits 8    Date for OT Re-Evaluation 03/04/24    Authorization Type [employer] Medicare 2025 VL: [place]    Authorization Time Period Auth# [id number] 01/04/24 - 02/29/24    Authorization - Number of Visits 9    OT Start Time 1230    OT Stop Time 1315    OT Time Calculation (min) 45 min    Equipment Utilized During Treatment tabletop skate, cane    Activity Tolerance Patient tolerated treatment well    Behavior During Therapy WFL for tasks assessed/performed             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   Stroke (HCC)    RICA, R A1, R MCA occlusion 07/19/19   Past Surgical History:  Procedure Laterality Date   CRANIOPLASTY Right 06/05/2020   Procedure: CRANIOPLASTY;  Surgeon: Lisbeth Renshaw, MD;  Location: [facility] OR;  Service: Neurosurgery;  Laterality: Right;  right   CRANIOTOMY Right 07/19/2019   Procedure: RIGHT HEMI-CRANIECTOMY With implantation of skull flap to abdominal wall;  Surgeon: Lisbeth Renshaw, MD;  Location: [facility] OR;  Service: Neurosurgery;  Laterality: Right;   CYST EXCISION N/A 10/08/2016   Procedure: EXCISION OF POSTERIOR NECK CYST;  Surgeon: Berna Bue, MD;  Location: [facility] ORS;  Service: General;  Laterality: N/A;   CYSTOSCOPY/URETEROSCOPY/HOLMIUM  LASER/STENT PLACEMENT Right 03/16/2020   Procedure: CYSTOSCOPY RIGHT RETROGRADE PYELOGRAM URETEROSCOPY/HOLMIUM LASER/STENT PLACEMENT;  Surgeon: Crista Elliot, MD;  Location: [facility];  Service: Urology;  Laterality: Right;   INCISION AND DRAINAGE ABSCESS N/A 09/22/2014   Procedure: INCISION AND DRAINAGE ABSCESS POSTERIOR NECK;  Surgeon: Valarie Merino, MD;  Location: [facility];  Service: General;  Laterality: N/A;   INCISION AND DRAINAGE ABSCESS N/A 12/20/2015   Procedure: INCISION AND DRAINAGE POSTERIOR NECK MASS;  Surgeon: Darnell Level, MD;  Location: [facility];  Service: General;  Laterality: N/A;   INCISION AND DRAINAGE ABSCESS Left 07/10/2004   middle finger   IR IVC FILTER PLMT / S&I /IMG GUID/MOD SED  07/21/2019   IR RADIOLOGIST EVAL & MGMT  12/14/2019   IR RADIOLOGIST EVAL & MGMT  12/21/2020   IR VENOGRAM RENAL UNI RIGHT  07/21/2019   MASS EXCISION N/A 07/21/2017   Procedure: EXCISION OF BENIGN NECK LESION WITH LAYERED CLOSURE;  Surgeon: Glenna Fellows, MD;  Location: [facility];  Service: Plastics;  Laterality: N/A;   MASS EXCISION N/A 11/10/2017   Procedure: EXCISION BENIGN LESION OF THE NECK WITH LAYERED CLOSURE;  Surgeon: Glenna Fellows, MD;  Location: [facility];  Service: Plastics;  Laterality: N/A;   Patient Active Problem List   Diagnosis Date Noted   Hx of adenomatous colonic polyps 06/01/2023  COVID-19 virus infection 04/21/2021   Left-sided weakness 04/21/2021   S/P craniotomy 06/05/2020   History of cranioplasty 06/05/2020   Peri-rectal abscess 02/14/2020   Abnormal CT scan, pelvis 02/14/2020   Pancytopenia (HCC) 12/02/2019   Nephrolithiasis 12/02/2019   Hydronephrosis with renal and ureteral calculus obstruction 12/02/2019   Rectal pain 12/02/2019   Hyperkalemia 12/02/2019   Reactive depression    Wound infection after surgery    Sleep disturbance    Dysphagia, post-stroke    Transaminitis    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   Cerebral abscess    Urinary tract infection without hematuria    Altered mental status    Primary hypercoagulable state (HCC)    Acute pulmonary embolism without acute cor pulmonale (HCC)    Deep vein thrombosis (DVT) of non-extremity vein    Hypokalemia    Acute blood loss anemia    Leukocytosis    Endotracheal tube present    Acute respiratory failure with hypoxemia (HCC)    Stroke (cerebrum) (HCC) 07/19/2019   Pressure injury of skin 07/19/2019   Acute CVA (cerebrovascular accident) (HCC)    Encephalopathy    Dysphagia    Acute encephalopathy    Essential hypertension    Obesity 03/12/2016   Scalp abscess 12/20/2015   Neck abscess 12/20/2015   Pilonidal cyst 02/08/2013    ONSET DATE: 12/22/2023  REFERRING DIAG: R25.2 (ICD-10-CM) - Spasticity  THERAPY DIAG:  Muscle weakness (generalized)  Other lack of coordination  Hemiplegia and hemiparesis following cerebral infarction affecting left non-dominant side (HCC)  Rationale for Evaluation and Treatment: Rehabilitation  PERTINENT HISTORY: Botox injection for muscle spasticity in L pectoralis 12/22/23  Presented 07/19/2019 with L sided weakness with diagnosis of Right middle cerebral artery stroke.  Patient underwent decompressive right hemicraniectomy with abdominal flap implant 07/19/2019 Inpatient Rehab: 09/06/2019 - 10/07/2019  PMHx: HTN, cranioplasty, encephalopathy  PRECAUTIONS: None  WEIGHT BEARING RESTRICTIONS: No  LIVING ENVIRONMENT:   Lives with: lives with spouse & children (48 yo daughter/14 yo son)  Patient previously had nurse that helped with bathing M-F x 3 hours/day but he reports not having this assistance in several months.  Shower is upstairs so he generally only sponge bathes.  Lives in: House Stairs: yes 3 steps to enter with rail on the R side entering; full flight of stairs (17 steps) indoors to get upstairs to shower but no longer goes up there s/p previous fall 2+ years  ago Has following equipment at home: Wheelchair (manual) and hospital bed; [facility] for bathing at bedside but goes to the bathroom to toilet; quad cane  PLOF: Prior to CVA - Independent with basic ADLs, Independent with household mobility without device, Independent with transfers,  Most recent - Needs assistance with ADLs, and Needs assistance with homemaking Patient was spending more time in [facility] at home due to moving around quicker but does walk around where/when he can daily with his quad cane  PATIENT GOALS: Be able to put his clothes on easier ie) jacket.  OBJECTIVE:   HAND DOMINANCE: Right  ADLs: Overall ADLs: Previously got assistance with LB ADLs and had a 'nurse' 5x/week x 3 hours Transfers/ambulation related to ADLs: MI with quad cane, uses WC to get around quicker at home Eating: Family cuts his food  Grooming: Patient shaves himself with clippers  UB Dressing: Shirt sometimes gets caught on his shoulder and he has trouble with a jacket LB Dressing: Can do underwear and pants & R sock/shoe and L  shoe but can't do L sock  Toileting: MI with quad cane Bathing: Can bathe UB and legs but can't wash his feet and R arm; has brush to clean his back, sits on BSC at bedside to wash his bottom  Tub Shower transfers: NA Equipment: bed side commode, Reacher, and Long handled sponge  IADLs: Shopping: Spouse Light housekeeping: Spouse/Nurse Meal Prep: Did not ask Community mobility: Drives himself ie) to therapy today Medication management: Did not ask Financial management: Did not ask Handwriting: 100% legible - R handed  MOBILITY STATUS: Needs Assist: unable to ambulate quickly for toielitng etc, Hx of falls, difficulty with turns, difficulty carrying objections with ambulation, and quad cane  POSTURE COMMENTS:  weight shift right Sitting balance: WFL  ACTIVITY TOLERANCE: Activity tolerance: Seated good, standing/gait limited  FUNCTIONAL OUTCOME MEASURES: TBD  UPPER EXTREMITY  ROM:    Active ROM Right eval Left eval  Shoulder flexion WFL AROM poor except for hand grip.  PROM Stiff for elbow, wrist and digital extension and L shoulder limited d/t subluxation stiffness & discomfort.  Shoulder abduction    Shoulder adduction    Shoulder extension    Shoulder internal rotation    Shoulder external rotation    Elbow flexion    Elbow extension    Wrist flexion    Wrist extension    Wrist ulnar deviation    Wrist radial deviation    Wrist pronation    Wrist supination    (Blank rows = not tested)  UPPER EXTREMITY MMT:     MMT Right eval Left eval  Shoulder flexion 5/5 0-1/5 - minimal flickers of muscle movement noted except for digits ie) able to squeeze functionally but can not open digits.   Digital flexion 3/5  Shoulder abduction    Shoulder adduction    Shoulder extension    Shoulder internal rotation    Shoulder external rotation    Middle trapezius    Lower trapezius    Elbow flexion    Elbow extension    Wrist flexion    Wrist extension    Wrist ulnar deviation    Wrist radial deviation    Wrist pronation    Wrist supination    (Blank rows = not tested)  HAND FUNCTION: R UE WFL; L UE Poor - can grasp item placed in hand but can not let go and can not move limb to pick up an item  COORDINATION: R UE WFL; L UE NA/poor  SENSATION: R UE WFL; L UE impaired  EDEMA: R UE NA; L UE slight  MUSCLE TONE: LUE: Moderate, Flaccid, and Shoulder subluxation 1-2 fingers widths and hand gets fisted and is difficulty to open ie) at night and then his hands hurts in the morning  COGNITION: Overall cognitive status: Impaired - Extra time needed to follow directions and answer questions at times.  VISION ASSESSMENT: Not tested  PERCEPTION: NT  PRAXIS: NT  Evaluation OBSERVATIONS: Patient is a tall black male with significant left hemiplegia with some shoulder subluxation of L shoulder and significant unwashed skin in various areas ie)  particularly L dorsal hand and pt shown how to scrub and clean area more thoroughly.  GOALS: Goals reviewed with patient? Yes  LONG TERM GOALS: Target date: 02/05/24  Pt  will be independent with HEP for L UE hemiplegia to work on shoulder approximation and comfort. Baseline: 1-2 finger width shoulder subluxation and pain with ambulation. Goal status: IN Progress  2.  Pt will demonstrate hemi-dressing techniques to more easily donning T-shirt/jacket and L sock. Baseline: Pt self-reports difficulty with these dressing tasks. Goal status: IN Progress  3.  Pt will have information re: hemi-sling options to help with shoulder positioning for improved comfort with gait.  Baseline: Previously used sling but it pulled on his neck. Goal status: In Progress - 07/08/23 - handout provided but pt does not have it anymore  4.  Pt will demonstrate LUE movement 2-/5 for shoulder and elbow. Baseline: 0-1/5 Goal status: IN Progress  ASSESSMENT:   PERFORMANCE DEFICITS: in functional skills including ADLs, IADLs, coordination, dexterity, sensation, tone, ROM, strength, pain, muscle spasms, flexibility, Fine motor control, Gross motor control, skin integrity, and UE functional use, cognitive skills including learn and understand, and psychosocial skills including routines and behaviors.    IMPAIRMENTS: are limiting patient from ADLs, rest and sleep, leisure, and social participation.    CO-MORBIDITIES: may have co-morbidities  that affects occupational performance. Patient will benefit from skilled OT to address above impairments and improve overall function.   REHAB POTENTIAL: Good  PLAN:  OT FREQUENCY: 1x/week  OT DURATION: up to 8 weeks - currently only scheduled for 4 weeks  PLANNED INTERVENTIONS: 97535 self care/ADL training, 78295 therapeutic exercise, 97530 therapeutic  activity, 97112 neuromuscular re-education, 97140 manual therapy, 97113 aquatic therapy, 97760 Orthotics management and training, 62130 Splinting (initial encounter), [phone] Subsequent splinting/medication, passive range of motion, coping strategies training, patient/family education, and DME and/or AE instructions  RECOMMENDED OTHER SERVICES: NA  CONSULTED AND AGREED WITH PLAN OF CARE: Patient   TODAY's TREATMENT: 02/01/24  SUBJECTIVE STATEMENT:  Pt reports no one at home to help get his new shoes on today.  Pt observed trying to walk without relying so heavily on his cane and therefore dragging it    Pt had Botox 12/22/23 and is scheduled again 03/12/24.  Pt accompanied by: self  PAIN:  Are you having pain? Some discomfort in shoulder that is relieved by shoulder approximation   FALLS: Has patient fallen in last 6 months? No  OBJECTIVE TREATMENT:    NRE: Neuromuscular reeducation provided, including specific visual, tactile and verbal cues and physical assistance to stimulate the neuromuscular system and promote functional ROM, movement and use of *** UE for activities such as *** grasp and release activities, elbow flexion/extension  Hand to face - pull versus push,   Tabletop skate used by pt with affected UE by placing arm atop skate for gravity eliminated motion to improve A/AROM, strength, coordination and endurance of affected elbow, shoulder and *** by practicing specific movements on table top.    PATIENT EDUCATION: Education details: Neuro Reed Person educated: Patient Education method: Explanation, Facilities manager, Actor cues, and Verbal cues Education comprehension: verbalized understanding and needs further education  HOME EXERCISE PROGRAM: 06/17/23: Table Top Slides - Access Code: [email or web address]  06/24/23: Additional Table Slides for Abduction/Scaption - same access code 07/01/23: Additional Dowel (Cane) AAROM -Flexion/abduction - same  access code 07/08/23: Additional Dowel (Cane) AAROM - Scaption - same code 08/12/23: Additional Table top arm slides - same code  CLINICAL IMPRESSION: Patient is a 48 y.o. male who was seen today for occupational therapy treatment for L hemiplegia and recent  receipt of Botox to L arm.  Pt engaged in hemi-dressing techniques with UB dressing.  Pt given several ideas to increase ease with dressing hemiplegic L UE.  Pt is looking into exploring hemi-sling option at home to help with comfort and support of arm during ambulation and will benefit from skilled occupational therapy services to address training with hemi-sling and further ADL strategies.   PLAN FOR NEXT SESSION:  Continue Hemi-dressing techniques and AE LUE HEP Hemi-sling considerations for mobility   Victorino Sparrow, OT 02/01/2024, 5:02 PM

## 2024-02-03 ENCOUNTER — Other Ambulatory Visit: Payer: Self-pay

## 2024-02-03 DIAGNOSIS — R252 Cramp and spasm: Secondary | ICD-10-CM

## 2024-02-03 DIAGNOSIS — I10 Essential (primary) hypertension: Secondary | ICD-10-CM

## 2024-02-03 MED ORDER — METOPROLOL TARTRATE 50 MG PO TABS
50.0000 mg | ORAL_TABLET | Freq: Two times a day (BID) | ORAL | 11 refills | Status: DC
Start: 2024-02-03 — End: 2024-04-25

## 2024-02-03 MED ORDER — BACLOFEN 10 MG PO TABS: 10.0000 mg | ORAL_TABLET | Freq: Three times a day (TID) | ORAL | 1 refills | Status: DC

## 2024-02-05 ENCOUNTER — Telehealth: Payer: Self-pay | Admitting: Physical Medicine and Rehabilitation

## 2024-02-05 ENCOUNTER — Other Ambulatory Visit: Payer: Self-pay | Admitting: Physical Medicine and Rehabilitation

## 2024-02-05 DIAGNOSIS — I639 Cerebral infarction, unspecified: Secondary | ICD-10-CM

## 2024-02-05 NOTE — Telephone Encounter (Signed)
 Patient called asking you to call him. He wants to do physical therapy at [facility] with [facility].

## 2024-02-05 NOTE — Telephone Encounter (Signed)
 Called patient and advised him to go to ED or the Urgent Care in the [facility] parking lot and if they believe that he needs to go to the hospital they will take him.

## 2024-02-05 NOTE — Telephone Encounter (Signed)
 Patient called in and states he has been having chest pain where he received his botox 1/28, patient does not know if it normal and is asking if he should go to emergency room or what is advised

## 2024-02-09 ENCOUNTER — Ambulatory Visit: Admitting: Occupational Therapy

## 2024-02-09 DIAGNOSIS — M6281 Muscle weakness (generalized): Secondary | ICD-10-CM

## 2024-02-09 DIAGNOSIS — R278 Other lack of coordination: Secondary | ICD-10-CM | POA: Diagnosis not present

## 2024-02-09 DIAGNOSIS — I69354 Hemiplegia and hemiparesis following cerebral infarction affecting left non-dominant side: Secondary | ICD-10-CM

## 2024-02-09 DIAGNOSIS — M25512 Pain in left shoulder: Secondary | ICD-10-CM | POA: Diagnosis not present

## 2024-02-09 DIAGNOSIS — Z741 Need for assistance with personal care: Secondary | ICD-10-CM | POA: Diagnosis not present

## 2024-02-09 DIAGNOSIS — G8929 Other chronic pain: Secondary | ICD-10-CM | POA: Diagnosis not present

## 2024-02-09 DIAGNOSIS — M24542 Contracture, left hand: Secondary | ICD-10-CM

## 2024-02-09 NOTE — Therapy (Signed)
 OUTPATIENT OCCUPATIONAL THERAPY NEURO TREATMENT  Patient Name: Matthew Black MRN: [id number] DOB:[birth year], 48 y.o., male Today's Date: 02/09/2024  PCP: Esperanza Richters, PA-C REFERRING PROVIDER: Horton Chin, MD  END OF SESSION:  OT End of Session - 02/09/24 1243     Visit Number 6    Number of Visits 8    Date for OT Re-Evaluation 03/04/24    Authorization Type [employer] 2025 VL: [place]    Authorization Time Period Auth# [id number] 01/04/24 - 02/29/24    Authorization - Visit Number 6    Authorization - Number of Visits 9    OT Start Time 1240    OT Stop Time 1335    OT Time Calculation (min) 55 min    Equipment Utilized During Treatment tabletop skate, thermoplastic material    Activity Tolerance Patient tolerated treatment well    Behavior During Therapy WFL for tasks assessed/performed             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   Stroke (HCC)    RICA, R A1, R MCA occlusion 07/19/19   Past Surgical History:  Procedure Laterality Date   CRANIOPLASTY Right 06/05/2020   Procedure: CRANIOPLASTY;  Surgeon: Lisbeth Renshaw, MD;  Location: [facility] OR;  Service: Neurosurgery;  Laterality: Right;  right   CRANIOTOMY Right 07/19/2019   Procedure: RIGHT HEMI-CRANIECTOMY With implantation of skull flap to abdominal wall;  Surgeon: Lisbeth Renshaw, MD;  Location: [facility] OR;  Service: Neurosurgery;  Laterality: Right;   CYST EXCISION N/A 10/08/2016   Procedure: EXCISION OF POSTERIOR NECK CYST;  Surgeon: Berna Bue, MD;  Location: [facility] ORS;  Service: General;   Laterality: N/A;   CYSTOSCOPY/URETEROSCOPY/HOLMIUM LASER/STENT PLACEMENT Right 03/16/2020   Procedure: CYSTOSCOPY RIGHT RETROGRADE PYELOGRAM URETEROSCOPY/HOLMIUM LASER/STENT PLACEMENT;  Surgeon: Crista Elliot, MD;  Location: [facility] OR[facility];  Service: Urology;  Laterality: Right;   INCISION AND DRAINAGE ABSCESS N/A 09/22/2014   Procedure: INCISION AND DRAINAGE ABSCESS POSTERIOR NECK;  Surgeon: Valarie Merino, MD;  Location: [facility] ORS;  Service: General;  Laterality: N/A;   INCISION AND DRAINAGE ABSCESS N/A 12/20/2015   Procedure: INCISION AND DRAINAGE POSTERIOR NECK MASS;  Surgeon: Darnell Level, MD;  Location: [facility];  Service: General;  Laterality: N/A;   INCISION AND DRAINAGE ABSCESS Left 07/10/2004   middle finger   IR IVC FILTER PLMT / S&I /IMG GUID/MOD SED  07/21/2019   IR RADIOLOGIST EVAL & MGMT  12/14/2019   IR RADIOLOGIST EVAL & MGMT  12/21/2020   IR VENOGRAM RENAL UNI RIGHT  07/21/2019   MASS EXCISION N/A 07/21/2017   Procedure: EXCISION OF BENIGN NECK LESION WITH LAYERED CLOSURE;  Surgeon: Glenna Fellows, MD;  Location: [facility];  Service: Plastics;  Laterality: N/A;   MASS EXCISION N/A 11/10/2017   Procedure: EXCISION BENIGN LESION OF THE NECK WITH LAYERED CLOSURE;  Surgeon: Glenna Fellows, MD;  Location: [facility];  Service: Plastics;  Laterality: N/A;   Patient Active Problem List   Diagnosis Date Noted  Hx of adenomatous colonic polyps 06/01/2023   COVID-19 virus infection 04/21/2021   Left-sided weakness 04/21/2021   S/P craniotomy 06/05/2020   History of cranioplasty 06/05/2020   Peri-rectal abscess 02/14/2020   Abnormal CT scan, pelvis 02/14/2020   Pancytopenia (HCC) 12/02/2019   Nephrolithiasis 12/02/2019   Hydronephrosis with renal and ureteral calculus obstruction 12/02/2019   Rectal pain 12/02/2019   Hyperkalemia 12/02/2019   Reactive depression    Wound infection after surgery    Sleep disturbance    Dysphagia, post-stroke     Transaminitis    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   Cerebral abscess    Urinary tract infection without hematuria    Altered mental status    Primary hypercoagulable state (HCC)    Acute pulmonary embolism without acute cor pulmonale (HCC)    Deep vein thrombosis (DVT) of non-extremity vein    Hypokalemia    Acute blood loss anemia    Leukocytosis    Endotracheal tube present    Acute respiratory failure with hypoxemia (HCC)    Stroke (cerebrum) (HCC) 07/19/2019   Pressure injury of skin 07/19/2019   Acute CVA (cerebrovascular accident) (HCC)    Encephalopathy    Dysphagia    Acute encephalopathy    Essential hypertension    Obesity 03/12/2016   Scalp abscess 12/20/2015   Neck abscess 12/20/2015   Pilonidal cyst 02/08/2013    ONSET DATE: 12/22/2023  REFERRING DIAG: R25.2 (ICD-10-CM) - Spasticity  SUBJECTIVE: See Today's treatment note at bottom of data.  THERAPY DIAG:  Muscle weakness (generalized)  Other lack of coordination  Hemiplegia and hemiparesis following cerebral infarction affecting left non-dominant side (HCC)  Contracture, left hand  Need for assistance with personal care  Rationale for Evaluation and Treatment: Rehabilitation  PERTINENT HISTORY: Botox injection for muscle spasticity in L pectoralis 12/22/23  Presented 07/19/2019 with L sided weakness with diagnosis of Right middle cerebral artery stroke.  Patient underwent decompressive right hemicraniectomy with abdominal flap implant 07/19/2019 Inpatient Rehab: 09/06/2019 - 10/07/2019  PMHx: HTN, cranioplasty, encephalopathy  PRECAUTIONS: None  WEIGHT BEARING RESTRICTIONS: No  LIVING ENVIRONMENT:   Lives with: lives with spouse & children (53 yo daughter/14 yo son)  Patient previously had nurse that helped with bathing M-F x 3 hours/day but he reports not having this assistance in several months.  Shower is upstairs so he generally only sponge bathes.  Lives in:  House Stairs: yes 3 steps to enter with rail on the R side entering; full flight of stairs (17 steps) indoors to get upstairs to shower but no longer goes up there s/p previous fall 2+ years ago Has following equipment at home: Wheelchair (manual) and hospital bed; [facility] for bathing at bedside but goes to the bathroom to toilet; quad cane  PLOF: Prior to CVA - Independent with basic ADLs, Independent with household mobility without device, Independent with transfers,  Most recent - Needs assistance with ADLs, and Needs assistance with homemaking Patient was spending more time in [facility] at home due to moving around quicker but does walk around where/when he can daily with his quad cane  PATIENT GOALS: Be able to put his clothes on easier ie) jacket.  OBJECTIVE:   HAND DOMINANCE: Right  ADLs: Overall ADLs: Previously got assistance with LB ADLs and had a 'nurse' 5x/week x 3 hours Transfers/ambulation related to ADLs: MI with quad cane, uses WC to get around quicker at home Eating: Family cuts his food  Grooming: Patient shaves himself with clippers  UB Dressing: Shirt sometimes gets caught on his shoulder and he has trouble with a jacket LB Dressing: Can do underwear and pants & R sock/shoe and L shoe but can't do L sock  Toileting: MI with quad cane Bathing: Can bathe UB and legs but can't wash his feet and R arm; has brush to clean his back, sits on BSC at bedside to wash his bottom  Tub Shower transfers: NA Equipment: bed side commode, Reacher, and Long handled sponge  IADLs: Shopping: Spouse Light housekeeping: Spouse/Nurse Meal Prep: Did not ask Community mobility: Drives himself ie) to therapy today Medication management: Did not ask Financial management: Did not ask Handwriting: 100% legible - R handed  MOBILITY STATUS: Needs Assist: unable to ambulate quickly for toielitng etc, Hx of falls, difficulty with turns, difficulty carrying objections with ambulation, and quad  cane  POSTURE COMMENTS:  weight shift right Sitting balance: WFL  ACTIVITY TOLERANCE: Activity tolerance: Seated good, standing/gait limited  FUNCTIONAL OUTCOME MEASURES: TBD  UPPER EXTREMITY ROM:    Active ROM Right eval Left eval  Shoulder flexion WFL AROM poor except for hand grip.  PROM Stiff for elbow, wrist and digital extension and L shoulder limited d/t subluxation stiffness & discomfort.  Shoulder abduction    Shoulder adduction    Shoulder extension    Shoulder internal rotation    Shoulder external rotation    Elbow flexion    Elbow extension    Wrist flexion    Wrist extension    Wrist ulnar deviation    Wrist radial deviation    Wrist pronation    Wrist supination    (Blank rows = not tested)  UPPER EXTREMITY MMT:     MMT Right eval Left eval  Shoulder flexion 5/5 0-1/5 - minimal flickers of muscle movement noted except for digits ie) able to squeeze functionally but can not open digits.   Digital flexion 3/5  Shoulder abduction    Shoulder adduction    Shoulder extension    Shoulder internal rotation    Shoulder external rotation    Middle trapezius    Lower trapezius    Elbow flexion    Elbow extension    Wrist flexion    Wrist extension    Wrist ulnar deviation    Wrist radial deviation    Wrist pronation    Wrist supination    (Blank rows = not tested)  HAND FUNCTION: R UE WFL; L UE Poor - can grasp item placed in hand but can not let go and can not move limb to pick up an item  COORDINATION: R UE WFL; L UE NA/poor  SENSATION: R UE WFL; L UE impaired  EDEMA: R UE NA; L UE slight  MUSCLE TONE: LUE: Moderate, Flaccid, and Shoulder subluxation 1-2 fingers widths and hand gets fisted and is difficulty to open ie) at night and then his hands hurts in the morning  COGNITION: Overall cognitive status: Impaired - Extra time needed to follow directions and answer questions at times.  VISION ASSESSMENT: Not tested  PERCEPTION:  NT  PRAXIS: NT  Evaluation OBSERVATIONS: Patient is a tall black male with significant left hemiplegia with some shoulder subluxation of L shoulder and significant unwashed skin in various areas ie) particularly L dorsal hand and pt shown how to scrub and clean area more thoroughly.  GOALS: Goals reviewed with patient? Yes  LONG TERM GOALS: Target date: 02/05/24  Pt  will be independent with HEP for L UE hemiplegia to work on shoulder approximation and comfort. Baseline: 1-2 finger width shoulder subluxation and pain with ambulation. Goal status: IN Progress  2.  Pt will demonstrate hemi-dressing techniques to more easily donning T-shirt/jacket and L sock. Baseline: Pt self-reports difficulty with these dressing tasks. Goal status: IN Progress  3.  Pt will have information re: hemi-sling options to help with shoulder positioning for improved comfort with gait.  Baseline: Previously used sling but it pulled on his neck. Goal status: In Progress - 07/08/23 - handout provided but pt does not have it anymore  4.  Pt will demonstrate LUE movement 2-/5 for shoulder and elbow. Baseline: 0-1/5 Goal status: IN Progress  ASSESSMENT:   PERFORMANCE DEFICITS: in functional skills including ADLs, IADLs, coordination, dexterity, sensation, tone, ROM, strength, pain, muscle spasms, flexibility, Fine motor control, Gross motor control, skin integrity, and UE functional use, cognitive skills including learn and understand, and psychosocial skills including routines and behaviors.    IMPAIRMENTS: are limiting patient from ADLs, rest and sleep, leisure, and social participation.    CO-MORBIDITIES: may have co-morbidities  that affects occupational performance. Patient will benefit from skilled OT to address above impairments and improve overall function.   REHAB POTENTIAL:  Good  PLAN:  OT FREQUENCY: 1x/week  OT DURATION: up to 8 weeks - currently only scheduled for 4 weeks  PLANNED INTERVENTIONS: 97535 self care/ADL training, 82956 therapeutic exercise, 97530 therapeutic activity, 97112 neuromuscular re-education, 97140 manual therapy, 97113 aquatic therapy, 97760 Orthotics management and training, 21308 Splinting (initial encounter), [phone] Subsequent splinting/medication, passive range of motion, coping strategies training, patient/family education, and DME and/or AE instructions  RECOMMENDED OTHER SERVICES: NA  CONSULTED AND AGREED WITH PLAN OF CARE: Patient   TODAY's TREATMENT: 02/09/24  SUBJECTIVE STATEMENT:  Pt reports his daughter was at home to help him get his new shoes on today.  She offered to help with his jacket but he did it himself and she was happy to see how good he did on his own.   Pt had Botox 12/22/23 and is scheduled again 03/12/24.  Pt accompanied by: self  PAIN:  Are you having pain? No   FALLS: Has patient fallen in last 6 months? No  OBJECTIVE TREATMENT:    Self Care: Pt engaged in managing his new shoe over his AFO on L foot today.  He was encouraged to leave his AFO in his shoe at home to help stretch it a bit wider for himself.  He was able to insert a small strip of velcro hook into the heel loop to get a better grasp to pull the shoe back on his foot and he was able pull his leg onto the bed with hip external rotation to reach his foot.  Once her put the shoe over his toes, he pulled the loop to get the shoe down his foot further and then pulled the shoe over his heel.  He put his R palm on the sole of his shoe and pulled it over his heel at least 3 different times without physical assistance from OTR.   Splint: Pt reports increased ease with table top slides with his L hand flat.  He is able to stretch it but not able to hold it flattened on his own, therefore flat taco splint was fabricated to hold fingers and thumb flat  during UE ROM.  He was show how to stretch his fingers, slide them inside the taco-like thermoplastic shape and strap his fingers as flat as possible. OT assured proper fit of splint with education provided on proper wear and care ie) during activity only with careful skin checks.    NRE: Neuromuscular reeducation provided, including specific visual, tactile and verbal cues and physical assistance to stimulate the neuromuscular system and promote functional ROM, movement and use of L UE for activities such as elbow flexion/extension and shoulder horizontal adduction/abduction with hands on support for gravity eliminated position.   Pt initiated NRE with tabletop slides with LUE with pulling and pushing motions during table top skate and washcloth.  Pt able to pull forearm support in gravity eliminated plane with some increased motion with R hand atop L hand for tactile feedback.  Pt encouraged to maximize LUE AROM versus moving his body for eliciting L UE motions.    PATIENT EDUCATION: Education details: Neuro Reed, taco splint use for ROM Person educated: Patient Education method: Explanation, Demonstration, Tactile cues, and Verbal cues Education comprehension: verbalized understanding and needs further education  HOME EXERCISE PROGRAM: 06/17/23: Table Top Slides - Access Code: [email or web address]  06/24/23: Additional Table Slides for Abduction/Scaption - same access code 07/01/23: Additional Dowel (Cane) AAROM -Flexion/abduction - same access code 07/08/23: Additional Dowel (Cane) AAROM - Scaption - same code 08/12/23: Additional Table top arm slides - same code  CLINICAL IMPRESSION: Patient is a 48 y.o. male who was seen today for occupational therapy treatment for L hemiplegia and receipt of Botox to L arm.  Pt continues to be engaged in [facility] for L UE AAROM activities with max encouragement for carryover at home as well as ADL comp training.  Pt has 2 further visits with  OT to complete ADL comp strategies and LUE HEP training.   PLAN FOR NEXT SESSION:  Continue Hemi-dressing techniques and AE LUE HEP  Victorino Sparrow, OT 02/09/2024, 1:52 PM

## 2024-02-16 ENCOUNTER — Ambulatory Visit: Payer: Self-pay | Admitting: Occupational Therapy

## 2024-02-23 ENCOUNTER — Ambulatory Visit: Payer: Self-pay | Attending: Physical Medicine and Rehabilitation | Admitting: Occupational Therapy

## 2024-02-23 DIAGNOSIS — R29818 Other symptoms and signs involving the nervous system: Secondary | ICD-10-CM | POA: Diagnosis not present

## 2024-02-23 DIAGNOSIS — Z741 Need for assistance with personal care: Secondary | ICD-10-CM | POA: Diagnosis not present

## 2024-02-23 DIAGNOSIS — I69354 Hemiplegia and hemiparesis following cerebral infarction affecting left non-dominant side: Secondary | ICD-10-CM | POA: Diagnosis not present

## 2024-02-23 DIAGNOSIS — M24542 Contracture, left hand: Secondary | ICD-10-CM

## 2024-02-23 DIAGNOSIS — R278 Other lack of coordination: Secondary | ICD-10-CM | POA: Diagnosis not present

## 2024-02-23 DIAGNOSIS — M6281 Muscle weakness (generalized): Secondary | ICD-10-CM | POA: Diagnosis not present

## 2024-02-23 NOTE — Therapy (Unsigned)
 OUTPATIENT OCCUPATIONAL THERAPY NEURO TREATMENT & DISCHARGE SUMMARY  Patient Name: Matthew Black MRN: [id number] DOB:[birth year], 48 y.o., male Today's Date: 02/23/2024  PCP: Esperanza Richters, PA-C REFERRING PROVIDER: Horton Chin, MD  END OF SESSION:  OT End of Session - 02/23/24 1326     Visit Number 7    Number of Visits 8    Date for OT Re-Evaluation 03/04/24    Authorization Type [employer] Medicare 2025 VL: [place]    Authorization Time Period Auth# [id number] 01/04/24 - 02/29/24    Authorization - Number of Visits 9    OT Start Time 1324    OT Stop Time 1405    OT Time Calculation (min) 41 min    Equipment Utilized During Treatment Mirror    Activity Tolerance Patient tolerated treatment well    Behavior During Therapy [facility] for tasks assessed/performed             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   Stroke (HCC)    RICA, R A1, R MCA occlusion 07/19/19   Past Surgical History:  Procedure Laterality Date   CRANIOPLASTY Right 06/05/2020   Procedure: CRANIOPLASTY;  Surgeon: Lisbeth Renshaw, MD;  Location: [facility] OR;  Service: Neurosurgery;  Laterality: Right;  right   CRANIOTOMY Right 07/19/2019   Procedure: RIGHT HEMI-CRANIECTOMY With implantation of skull flap to abdominal wall;  Surgeon: Lisbeth Renshaw, MD;  Location: [facility] OR;  Service: Neurosurgery;  Laterality: Right;   CYST EXCISION N/A 10/08/2016   Procedure: EXCISION OF POSTERIOR NECK CYST;  Surgeon: Berna Bue, MD;  Location: [facility] ORS;  Service: General;  Laterality: N/A;   CYSTOSCOPY/URETEROSCOPY/HOLMIUM  LASER/STENT PLACEMENT Right 03/16/2020   Procedure: CYSTOSCOPY RIGHT RETROGRADE PYELOGRAM URETEROSCOPY/HOLMIUM LASER/STENT PLACEMENT;  Surgeon: Crista Elliot, MD;  Location: [facility] ORS;  Service: Urology;  Laterality: Right;   INCISION AND DRAINAGE ABSCESS N/A 09/22/2014   Procedure: INCISION AND DRAINAGE ABSCESS POSTERIOR NECK;  Surgeon: Valarie Merino, MD;  Location: [facility] ORS;  Service: General;  Laterality: N/A;   INCISION AND DRAINAGE ABSCESS N/A 12/20/2015   Procedure: INCISION AND DRAINAGE POSTERIOR NECK MASS;  Surgeon: Darnell Level, MD;  Location: [facility] ORS;  Service: General;  Laterality: N/A;   INCISION AND DRAINAGE ABSCESS Left 07/10/2004   middle finger   IR IVC FILTER PLMT / S&I /IMG GUID/MOD SED  07/21/2019   IR RADIOLOGIST EVAL & MGMT  12/14/2019   IR RADIOLOGIST EVAL & MGMT  12/21/2020   IR VENOGRAM RENAL UNI RIGHT  07/21/2019   MASS EXCISION N/A 07/21/2017   Procedure: EXCISION OF BENIGN NECK LESION WITH LAYERED CLOSURE;  Surgeon: Glenna Fellows, MD;  Location: [facility];  Service: Plastics;  Laterality: N/A;   MASS EXCISION N/A 11/10/2017   Procedure: EXCISION BENIGN LESION OF THE NECK WITH LAYERED CLOSURE;  Surgeon: Glenna Fellows, MD;  Location: [facility];  Service: Plastics;  Laterality: N/A;   Patient Active Problem List   Diagnosis Date Noted   Hx of adenomatous colonic polyps 06/01/2023  COVID-19 virus infection 04/21/2021   Left-sided weakness 04/21/2021   S/P craniotomy 06/05/2020   History of cranioplasty 06/05/2020   Peri-rectal abscess 02/14/2020   Abnormal CT scan, pelvis 02/14/2020   Pancytopenia (HCC) 12/02/2019   Nephrolithiasis 12/02/2019   Hydronephrosis with renal and ureteral calculus obstruction 12/02/2019   Rectal pain 12/02/2019   Hyperkalemia 12/02/2019   Reactive depression    Wound infection after surgery    Sleep disturbance    Dysphagia, post-stroke    Transaminitis    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   Cerebral abscess    Urinary tract infection without hematuria    Altered mental status    Primary hypercoagulable state (HCC)    Acute pulmonary embolism without acute cor pulmonale (HCC)    Deep vein thrombosis (DVT) of non-extremity vein    Hypokalemia    Acute blood loss anemia    Leukocytosis    Endotracheal tube present    Acute respiratory failure with hypoxemia (HCC)    Stroke (cerebrum) (HCC) 07/19/2019   Pressure injury of skin 07/19/2019   Acute CVA (cerebrovascular accident) (HCC)    Encephalopathy    Dysphagia    Acute encephalopathy    Essential hypertension    Obesity 03/12/2016   Scalp abscess 12/20/2015   Neck abscess 12/20/2015   Pilonidal cyst 02/08/2013    ONSET DATE: 12/22/2023  REFERRING DIAG: R25.2 (ICD-10-CM) - Spasticity  SUBJECTIVE: See Today's treatment note at bottom of data.  THERAPY DIAG:  Muscle weakness (generalized)  Other lack of coordination  Contracture, left hand  Need for assistance with personal care  Hemiplegia and hemiparesis following cerebral infarction affecting left non-dominant side (HCC)  Other symptoms and signs involving the nervous system  Rationale for Evaluation and Treatment: Rehabilitation  PERTINENT HISTORY: Botox injection for muscle spasticity in L pectoralis 12/22/23  Presented 07/19/2019 with L sided weakness with diagnosis of Right middle cerebral artery stroke.  Patient underwent decompressive right hemicraniectomy with abdominal flap implant 07/19/2019 Inpatient Rehab: 09/06/2019 - 10/07/2019  PMHx: HTN, cranioplasty, encephalopathy  PRECAUTIONS: None  WEIGHT BEARING RESTRICTIONS: No  LIVING ENVIRONMENT:   Lives with: lives with spouse & children (79 yo daughter/14 yo son)  Patient previously had nurse that helped with bathing M-F x 3 hours/day but he reports not having this assistance in several months.  Shower is upstairs so he generally only sponge bathes.  Lives in:  House Stairs: yes 3 steps to enter with rail on the R side entering; full flight of stairs (17 steps) indoors to get upstairs to shower but no longer goes up there s/p previous fall 2+ years ago Has following equipment at home: Wheelchair (manual) and hospital bed; [facility] for bathing at bedside but goes to the bathroom to toilet; quad cane  PLOF: Prior to CVA - Independent with basic ADLs, Independent with household mobility without device, Independent with transfers,  Most recent - Needs assistance with ADLs, and Needs assistance with homemaking Patient was spending more time in [facility] at home due to moving around quicker but does walk around where/when he can daily with his quad cane  PATIENT GOALS: Be able to put his clothes on easier ie) jacket.  OBJECTIVE:   HAND DOMINANCE: Right  ADLs: Overall ADLs: Previously got assistance with LB ADLs and had a 'nurse' 5x/week x 3 hours Transfers/ambulation related to ADLs: MI with quad cane, uses WC to get around quicker at home Eating: Family cuts his food  Grooming: Patient shaves himself with  clippers  UB Dressing: Shirt sometimes gets caught on his shoulder and he has trouble with a jacket LB Dressing: Can do underwear and pants & R sock/shoe and L shoe but can't do L sock  Toileting: MI with quad cane Bathing: Can bathe UB and legs but can't wash his feet and R arm; has brush to clean his back, sits on BSC at bedside to wash his bottom  Tub Shower transfers: NA Equipment: bed side commode, Reacher, and Long handled sponge  IADLs: Shopping: Spouse Light housekeeping: Spouse/Nurse Meal Prep: Did not ask Community mobility: Drives himself ie) to therapy today Medication management: Did not ask Financial management: Did not ask Handwriting: 100% legible - R handed  MOBILITY STATUS: Needs Assist: unable to ambulate quickly for toielitng etc, Hx of falls, difficulty with turns, difficulty carrying objections with ambulation, and quad  cane  POSTURE COMMENTS:  weight shift right Sitting balance: WFL  ACTIVITY TOLERANCE: Activity tolerance: Seated good, standing/gait limited  FUNCTIONAL OUTCOME MEASURES: TBD  UPPER EXTREMITY ROM:    Active ROM Right eval Left eval  Shoulder flexion WFL AROM poor except for hand grip.  PROM Stiff for elbow, wrist and digital extension and L shoulder limited d/t subluxation stiffness & discomfort.  Shoulder abduction    Shoulder adduction    Shoulder extension    Shoulder internal rotation    Shoulder external rotation    Elbow flexion    Elbow extension    Wrist flexion    Wrist extension    Wrist ulnar deviation    Wrist radial deviation    Wrist pronation    Wrist supination    (Blank rows = not tested)  UPPER EXTREMITY MMT:     MMT Right eval Left eval  Shoulder flexion 5/5 0-1/5 - minimal flickers of muscle movement noted except for digits ie) able to squeeze functionally but can not open digits.   Digital flexion 3/5  Shoulder abduction    Shoulder adduction    Shoulder extension    Shoulder internal rotation    Shoulder external rotation    Middle trapezius    Lower trapezius    Elbow flexion    Elbow extension    Wrist flexion    Wrist extension    Wrist ulnar deviation    Wrist radial deviation    Wrist pronation    Wrist supination    (Blank rows = not tested)  HAND FUNCTION: R UE WFL; L UE Poor - can grasp item placed in hand but can not let go and can not move limb to pick up an item  COORDINATION: R UE WFL; L UE NA/poor  SENSATION: R UE WFL; L UE impaired  EDEMA: R UE NA; L UE slight  MUSCLE TONE: LUE: Moderate, Flaccid, and Shoulder subluxation 1-2 fingers widths and hand gets fisted and is difficulty to open ie) at night and then his hands hurts in the morning  COGNITION: Overall cognitive status: Impaired - Extra time needed to follow directions and answer questions at times.  VISION ASSESSMENT: Not tested  PERCEPTION:  NT  PRAXIS: NT  Evaluation OBSERVATIONS: Patient is a tall black male with significant left hemiplegia with some shoulder subluxation of L shoulder and significant unwashed skin in various areas ie) particularly L dorsal hand and pt shown how to scrub and clean area more thoroughly.  GOALS: Goals reviewed with patient? Yes  LONG TERM GOALS: Target date: 02/05/24  Pt  will be independent with HEP for L UE hemiplegia to work on shoulder approximation and comfort. Baseline: 1-2 finger width shoulder subluxation and pain with ambulation. Goal status: MET  2.  Pt will demonstrate hemi-dressing techniques to more easily donning T-shirt/jacket and L sock. Baseline: Pt self-reports difficulty with these dressing tasks. Goal status: MET  Can get dressed with diabetic socks  3.  Pt will have information re: hemi-sling options to help with shoulder positioning for improved comfort with gait.  Baseline: Previously used sling but it pulled on his neck. Goal status: Discontinued, pt not interested  4.  Pt will demonstrate LUE movement 2-/5 for shoulder and elbow. Baseline: 0-1/5 Goal status: MET Shoulder 1 to 1+, elbow/hand/digits 2- (table top slides)  ASSESSMENT:   PERFORMANCE DEFICITS: in functional skills including ADLs, IADLs, coordination, dexterity, sensation, tone, ROM, strength, pain, muscle spasms, flexibility, Fine motor control, Gross motor control, skin integrity, and UE functional use, cognitive skills including learn and understand, and psychosocial skills including routines and behaviors.    IMPAIRMENTS: are limiting patient from ADLs, rest and sleep, leisure, and social participation.    CO-MORBIDITIES: may have co-morbidities  that affects occupational performance. Patient will benefit from skilled OT to address above impairments and improve overall function.    REHAB POTENTIAL: Good  PLAN:  OT FREQUENCY: 1x/week  OT DURATION: up to 8 weeks - currently only scheduled for 4 weeks  PLANNED INTERVENTIONS: 97535 self care/ADL training, 95621 therapeutic exercise, 97530 therapeutic activity, 97112 neuromuscular re-education, 97140 manual therapy, 97113 aquatic therapy, 97760 Orthotics management and training, 30865 Splinting (initial encounter), [phone] Subsequent splinting/medication, passive range of motion, coping strategies training, patient/family education, and DME and/or AE instructions  RECOMMENDED OTHER SERVICES: NA  CONSULTED AND AGREED WITH PLAN OF CARE: Patient   TODAY's TREATMENT: 02/23/24  SUBJECTIVE STATEMENT:  Pt stated he was "in a pickle" this morning but got out if it himself ie) he eventually got his shoe on himself today although it took him some extra time.  Pt reports wearing splint everyday he can get it on - about 5 x/week. Pt reported he got some diabetic socks and he is able to get them on himself quite easily but they come off in the night so he doesn't prefer them.  Pt had Botox 12/22/23 and is scheduled again 03/12/24.  Pt accompanied by: self  PAIN:  Are you having pain? No   FALLS: Has patient fallen in last 6 months? No  OBJECTIVE TREATMENT:    Self Care: Pt education provided for DC from OT today as pt is at max potential at this time with LUE HEP and management.  Daily use of splint for R hand recommended along with HEP for supported LUE ROM.  Also reviewed ADL strategies with pt reporting ability to don diabetic socks but then the top is too loose and his socks fall off at night.  OT was able to find another pair of socks worn by a different pt that are snug but stretchable to allow increased ease with self-application of sock versus the tube socks he currently wears and seeks help with from family at times.  He does confirm using strategies recommended to help don shoes and got them on himself today although it  did take him a bit longer to get them on.    NRE: Neuromuscular reeducation provided, along with HEP review, with specific visual, tactile  and verbal cues and physical assistance to stimulate the neuromuscular system and promote functional ROM, movement and use of L UE for motions such as shoulder elevation, elbow flexion/extension and shoulder horizontal adduction/abduction.    Pt encouraged to facilitate motions using mirror for feedback, take his time to recruit motions on L side and not doing too many reps and wearing out the L arm.  He initiates NRE with shoulder shrugs, tabletop slides with LUE with pulling and pushing motions during table top slides.  Pt able to pull forearm support in gravity eliminated plane with some increased motion with R hand atop L hand for tactile feedback.  Pt encouraged to use mirror to help maximize LUE AROM versus moving his body for eliciting L UE motions.    PATIENT EDUCATION: Education details: Discharge instructions re: splint wear and HEP Person educated: Patient Education method: Explanation, Demonstration, Tactile cues, and Verbal cues Education comprehension: verbalized understanding  HOME EXERCISE PROGRAM: 06/17/23: Table Top Slides - Access Code: [email or web address]  06/24/23: Additional Table Slides for Abduction/Scaption - same access code 07/01/23: Additional Dowel (Cane) AAROM -Flexion/abduction - same access code 07/08/23: Additional Dowel (Cane) AAROM - Scaption - same code 08/12/23: Additional Table top arm slides - same code 02/23/24 -- Reviewed HEP suggestions and texted him Med[employer] HEP access code as noted above  CLINICAL IMPRESSION: Patient is a 48 y.o. male who was seen today for occupational therapy treatment for L hemiplegia and receipt of Botox to L arm in January with next appt later this month.  Discharge instructions completed for in [facility] for L UE AAROM activities for max carryover at home along with ADL comp  strategies.   OCCUPATIONAL THERAPY DISCHARGE SUMMARY  Visits from Start of Care: 7  Current functional level related to goals / functional outcomes: Pt has met all goals to satisfactory levels and is pleased with outcomes.   Remaining deficits: Pt has chronic L sided functional deficits due to hemiplegia with ongoing medical treatments including Botox injections for LUE later this month.   Education / Equipment: Pt has all needed materials (splint and AE) and education (HEP and ADL comp strategies). Pt understands how to continue on with self-management. See tx notes for more details.   Patient reluctant but agreeable to discharge due to max benefits received from outpatient occupational therapy at this time.    Victorino Sparrow, OT 02/23/2024, 7:10 PM

## 2024-03-07 ENCOUNTER — Ambulatory Visit: Admitting: Physical Therapy

## 2024-03-08 ENCOUNTER — Telehealth: Payer: Self-pay | Admitting: Physical Medicine and Rehabilitation

## 2024-03-08 NOTE — Telephone Encounter (Signed)
 Task completed

## 2024-03-08 NOTE — Telephone Encounter (Signed)
 Patient called and stated he needs work done on his wheelchair. He stated [facility] is request a note/referral so they can work on it.

## 2024-03-08 NOTE — Telephone Encounter (Signed)
 [facility] - [facility] left vm for someone to return the call to her [phone]

## 2024-03-10 ENCOUNTER — Ambulatory Visit: Attending: Physical Medicine and Rehabilitation | Admitting: Physical Therapy

## 2024-03-10 ENCOUNTER — Other Ambulatory Visit: Payer: Self-pay

## 2024-03-10 DIAGNOSIS — R278 Other lack of coordination: Secondary | ICD-10-CM

## 2024-03-10 DIAGNOSIS — R2681 Unsteadiness on feet: Secondary | ICD-10-CM | POA: Diagnosis not present

## 2024-03-10 DIAGNOSIS — I639 Cerebral infarction, unspecified: Secondary | ICD-10-CM | POA: Diagnosis not present

## 2024-03-10 DIAGNOSIS — M6281 Muscle weakness (generalized): Secondary | ICD-10-CM

## 2024-03-10 DIAGNOSIS — R2689 Other abnormalities of gait and mobility: Secondary | ICD-10-CM | POA: Diagnosis not present

## 2024-03-10 DIAGNOSIS — I69354 Hemiplegia and hemiparesis following cerebral infarction affecting left non-dominant side: Secondary | ICD-10-CM

## 2024-03-10 DIAGNOSIS — R29818 Other symptoms and signs involving the nervous system: Secondary | ICD-10-CM

## 2024-03-10 NOTE — Therapy (Signed)
 OUTPATIENT PHYSICAL THERAPY NEURO EVALUATION   Patient Name: Matthew Black MRN: [id number] DOB:1976/10/31, 48 y.o., male Today's Date: 03/10/2024   PCP: Esperanza Richters, PA-C REFERRING PROVIDER: Horton Chin, MD  END OF SESSION:   PT End of Session - 03/10/24 1303     Visit Number 1    Number of Visits 24    Date for PT Re-Evaluation 06/02/24    PT Start Time 1315    PT Stop Time 1405    PT Time Calculation (min) 50 min    Equipment Utilized During Treatment Gait belt;Other (comment)   L LE custom AFO   Activity Tolerance Patient tolerated treatment well    Behavior During Therapy [facility] for tasks assessed/performed             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   Stroke (HCC)    RICA, R A1, R MCA occlusion 07/19/19   Past Surgical History:  Procedure Laterality Date   CRANIOPLASTY Right 06/05/2020   Procedure: CRANIOPLASTY;  Surgeon: Lisbeth Renshaw, MD;  Location: [facility] OR;  Service: Neurosurgery;  Laterality: Right;  right   CRANIOTOMY Right 07/19/2019   Procedure: RIGHT HEMI-CRANIECTOMY With implantation of skull flap to abdominal wall;  Surgeon: Lisbeth Renshaw, MD;  Location: [facility] OR;  Service: Neurosurgery;  Laterality: Right;   CYST EXCISION N/A 10/08/2016   Procedure: EXCISION OF POSTERIOR NECK CYST;  Surgeon: Berna Bue, MD;  Location: [facility] ORS;  Service: General;  Laterality: N/A;   CYSTOSCOPY/URETEROSCOPY/HOLMIUM LASER/STENT PLACEMENT Right 03/16/2020   Procedure: CYSTOSCOPY RIGHT RETROGRADE PYELOGRAM URETEROSCOPY/HOLMIUM LASER/STENT PLACEMENT;   Surgeon: Crista Elliot, MD;  Location: [facility] ORS;  Service: Urology;  Laterality: Right;   INCISION AND DRAINAGE ABSCESS N/A 09/22/2014   Procedure: INCISION AND DRAINAGE ABSCESS POSTERIOR NECK;  Surgeon: Valarie Merino, MD;  Location: [facility] ORS;  Service: General;  Laterality: N/A;   INCISION AND DRAINAGE ABSCESS N/A 12/20/2015   Procedure: INCISION AND DRAINAGE POSTERIOR NECK MASS;  Surgeon: Darnell Level, MD;  Location: [facility] ORS;  Service: General;  Laterality: N/A;   INCISION AND DRAINAGE ABSCESS Left 07/10/2004   middle finger   IR IVC FILTER PLMT / S&I /IMG GUID/MOD SED  07/21/2019   IR RADIOLOGIST EVAL & MGMT  12/14/2019   IR RADIOLOGIST EVAL & MGMT  12/21/2020   IR VENOGRAM RENAL UNI RIGHT  07/21/2019   MASS EXCISION N/A 07/21/2017   Procedure: EXCISION OF BENIGN NECK LESION WITH LAYERED CLOSURE;  Surgeon: Glenna Fellows, MD;  Location: [facility];  Service: Plastics;  Laterality: N/A;   MASS EXCISION N/A 11/10/2017   Procedure: EXCISION BENIGN LESION OF THE NECK WITH LAYERED CLOSURE;  Surgeon: Glenna Fellows, MD;  Location: [facility];  Service: Plastics;  Laterality: N/A;   Patient Active Problem List   Diagnosis Date Noted   Hx of adenomatous colonic polyps 06/01/2023   COVID-19 virus infection 04/21/2021   Left-sided weakness 04/21/2021   S/P craniotomy 06/05/2020   History of cranioplasty 06/05/2020  Peri-rectal abscess 02/14/2020   Abnormal CT scan, pelvis 02/14/2020   Pancytopenia (HCC) 12/02/2019   Nephrolithiasis 12/02/2019   Hydronephrosis with renal and ureteral calculus obstruction 12/02/2019   Rectal pain 12/02/2019   Hyperkalemia 12/02/2019   Reactive depression    Wound infection after surgery    Sleep disturbance    Dysphagia, post-stroke    Transaminitis    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   Cerebral abscess    Urinary tract infection without hematuria    Altered mental status    Primary hypercoagulable  state (HCC)    Acute pulmonary embolism without acute cor pulmonale (HCC)    Deep vein thrombosis (DVT) of non-extremity vein    Hypokalemia    Acute blood loss anemia    Leukocytosis    Endotracheal tube present    Acute respiratory failure with hypoxemia (HCC)    Stroke (cerebrum) (HCC) 07/19/2019   Pressure injury of skin 07/19/2019   Acute CVA (cerebrovascular accident) (HCC)    Encephalopathy    Dysphagia    Acute encephalopathy    Essential hypertension    Obesity 03/12/2016   Scalp abscess 12/20/2015   Neck abscess 12/20/2015   Pilonidal cyst 02/08/2013    ONSET DATE: stroke in August 2020  REFERRING DIAG: I63.9 (ICD-10-CM) - Cerebrovascular accident (CVA), unspecified mechanism (HCC)   THERAPY DIAG:  Muscle weakness (generalized)  Other lack of coordination  Hemiplegia and hemiparesis following cerebral infarction affecting left non-dominant side (HCC)  Other symptoms and signs involving the nervous system  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)  Other abnormalities of gait and mobility  Unsteadiness on feet  Rationale for Evaluation and Treatment: Rehabilitation  SUBJECTIVE:                                                                                                                                                                                             SUBJECTIVE STATEMENT:  Patient reports his goal is to walk without using a cane and without assistance. Patient states he wants to walk like he did before having the stroke. Reports his stroke was in August of 2020. States he currently uses a quad cane for ambulation and tries to walk as much as possible. Reports he has a manual wheelchair, but states he tries not to use it because it depresses him and makes him feel like he is back to when he first had his stroke. Reports wearing L LE custom AFO at all times.   Reports he is currently doing everything  mod-I, but states he could call his mom,  dad, or brothers if he needed assistance.  Reports he has routine botox injections in L UE to help manage tone, but doesn't receive any in his LE.  Reports he has a hospital bed, but sleeps in a regular bed.  Pt accompanied by: self  PERTINENT HISTORY: L hemiparesis s/p R ACA and MCA territory CVA in August 2020, HTN, hx of kidney stones, depression  Per Neurology MD note on 11/24/2023: 15-year-old African-American male with large right hemispheric infarct due to right internal carotid and middle cerebral artery occlusion with cytotoxic edema and brain herniation s/p hemicraniectomy in 06/2019 is doing reasonably well with residual left spastic hemiparesis and left hemianopia.  Etiology of carotid occlusion unclear possibly dissection.  He had a prolonged hospital admission with several complications including DVT, pulmonary embolism, hemorrhagic transformation, abdominal wall hematoma, UTI but made quite remarkable recovery   PAIN:  Are you having pain? No, denies pain today, but states sometimes will have L shoulder pain  PRECAUTIONS: Fall  RED FLAGS: None   WEIGHT BEARING RESTRICTIONS: No  FALLS: Has patient fallen in last 6 months? 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  DIAGNOSTIC FINDINGS:  EXAM: CT ANGIOGRAPHY HEAD AND NECK WITH AND WITHOUT CONTRAST IMPRESSION: 1. No acute intracranial abnormality. 2. Chronically occluded right ICA with minimal opacification of the right MCA and ACA branches. 3. Redemonstrated extensive encephalomalacia in the right MCA and ACA territories secondary to a prior infarct.  Electronically Signed   By: Clora Dane M.D.   On: 11/27/2023 13:45  COGNITION: Overall cognitive status: Within functional limits for tasks assessed   SENSATION: Light touch: Impaired in L LE Proprioception: Impaired  in L LE  Can feel deep pressure in L LE   COORDINATION: Impaired in L LE due to paresis, hypertonia, and overall decreased flexibility  EDEMA:  Not formally assessed  MUSCLE TONE: LLE: Mild, Moderate, and Hypertonic  MUSCLE LENGTH: Not formally assessed; however, demonstrates L hamstring muscle tightness  DTRs:  Not formally assessed  POSTURE: rounded shoulders, forward head, and weight shift right  LOWER EXTREMITY ROM:       Right Eval Active ROM Left Eval Passive ROM  Hip flexion WFL Achieves at least 90  Hip extension    Hip abduction    Hip adduction    Hip internal rotation  Unable to achieve neutral hip rotation, lacking internal rotation  Hip external rotation  [facility] and rests in excessive ER  Knee flexion [facility]  Knee extension WFL Able to achieve terminal knee extension in supine, but with significant discomfort due to hamstring tone  Ankle dorsiflexion WFL Did not remove AFO to assess this date  Ankle plantarflexion WFL Did not  remove AFO to assess this date  Ankle inversion    Ankle eversion     (Blank rows = not tested)  LOWER EXTREMITY MMT:    MMT Right Eval Left Eval   Hip flexion 4+ 2-  (compensates with hip adductor activation)  Hip extension    Hip abduction    Hip adduction  3+  Hip internal rotation  2-  Hip external rotation  2  Knee flexion 4+ 1  Knee extension 4+ 3+  Ankle dorsiflexion 4+ Did not remove AFO to assess this date  Ankle plantarflexion 4+ Did not remove AFO to assess this date  Ankle inversion    Ankle eversion    (Blank rows = not tested)  Manual Muscle Test Scale 0/5 = No muscle contraction can be seen or felt 1/5 = Contraction can be felt, but there is no motion 2-/5 = Part moves through incomplete ROM w/ gravity decreased 2/5 = Part moves through complete ROM w/ gravity decreased 2+/5 = Part moves through incomplete ROM (<50%) against gravity or through complete ROM w/ gravity 3-/5 = Part moves through incomplete ROM (>50%) against gravity 3/5 = Part moves through complete ROM against gravity 3+/5 = Part moves through complete ROM against gravity/slight resistance 4-/5= Holds test position against slight to moderate pressure 4/5 = Part moves through complete ROM against gravity/moderate resistance 4+/5= Holds test position against moderate to strong pressure 5/5 = Part moves through complete ROM against gravity/full resistance  BED MOBILITY: (sleeps in regular bed, but has hospital bed available) Findings: Sit to supine CGA Supine to sit Min A and Mod A Rolling to Right Mod A Rolling to Left Min A and Mod A  TRANSFERS: Sit to stand: CGA and Min A  Assistive device utilized: Counselling psychologist     Stand to sit: CGA and Min A  Assistive device utilized: Tree surgeon to chair: Min A  Assistive device utilized: Counselling psychologist       RAMP:  Not tested  CURB:  Not tested  STAIRS: Not tested *need to assess*  GAIT: Findings: advances L LE with excessive hip external rotation using hip adductors, step to pattern, decreased arm swing- Left, decreased step length- Right, decreased stance  time- Left, decreased stride length, decreased hip/knee flexion- Left, decreased ankle dorsiflexion- Left, lateral lean- Right, and decreased trunk rotation Distance walked: ~42ft Assistive device utilized: small based quad cane in R UE  Level of assistance: CGA and Min A  FUNCTIONAL TESTS:  5 times sit to stand: need to assess Timed up and go (TUG): need to assess 6 minute walk test: need to assess 10 meter walk test: 0.39m/s using small based quad cane and CGA Berg Balance Scale: need to assess  PATIENT SURVEYS:  Stroke Impact Scale need to give  TREATMENT DATE: 03/10/2024   Vitals in sitting to start: BP 115/89 (MAP 98), HR 64bpm   Performed supine L LE hip stretch into IR as well as hamstring stretch to achieve full knee extension. Pt reports feeling a little better after the stretching, but did rate it as 9/10 during with significant discomfort.  10 Meter Walk Test: Patient instructed to walk 10 meters (32.8 ft) as quickly and as safely as possible at their normal speed Results: 0.30 m/s using small based quad cane with CGA and 1x light min A due to balance instability  (32.74 seconds)  Cut off scores:   Household Ambulator  < 0.4 m/s  Limited Community Ambulator  0.4 - 0.8 m/s  [employer]  > 0.8 m/s  Increased fall risk  < 1.81m/s  Crossing a Street  >1.17m/s  MCID 0.05 m/s (small), 0.13 m/s (moderate), 0.06 m/s (significant)  (ANPTA Core Set of Outcome Measures for Adults with Neurologic Conditions, 2018)    Provided below HEP and educated on how to perform at home.   PATIENT EDUCATION: Education details: Therapy POC, LTGs, HEP, importance of improving L LE flexibility, findings during assessment  Person educated: Patient Education method: Explanation and Handouts Education comprehension: verbalized understanding and needs further  education  HOME EXERCISE PROGRAM:  Access Code: [email or web address] URL: [email or web address] Date: 03/10/2024 Prepared by: Carlen Chasten  Exercises - Seated Hip Adduction Isometrics with Ball  - 1 x daily - 7 x weekly - 2 sets - 10 reps - 5 seconds hold - Supine Quadricep Sets  - 1 x daily - 7 x weekly - 2 sets - 10 reps - 3 seconds hold  GOALS: Goals reviewed with patient? Yes  SHORT TERM GOALS: Target date: 04/21/2024  Patient will be independent in home exercise program to improve strength/mobility for better functional independence with ADLs.  Baseline: initiated on 03/10/2024 Goal status: INITIAL   LONG TERM GOALS: Target date: 06/02/2024   Patient (< 66 years old) will complete five times sit to stand (5XSTS) test in < 10 seconds indicating an increased LE strength and improved balance.  Baseline: need to assess Goal status: INITIAL  2.  Patient will increase Berg Balance score to > 45/56 to demonstrate improved balance and decreased fall risk during functional activities and ADLs.  Baseline: need to assess Goal status: INITIAL  3.  Patient will increase 10 meter walk test to >1.39m/s using LRAD as to improve gait speed for better community ambulation and to reduce fall risk.  Baseline: 0.30 m/s using small based quad cane with CGA and 1x light min A due to balance instability  Goal status: INITIAL  4.  Patient will increase six minute walk test distance to >52ft for progression towards community ambulator and improve gait ability  Baseline: need to assess Goal status: INITIAL  5.  Patient will reduce timed up and go to <15 seconds to reduce fall risk and demonstrate improved transfer/gait ability. Baseline: need to assess Goal status: INITIAL  6.  Patient will ascend/descend 4 stairs, using railing, independently without loss of balance to improve ability to get in/out of home.   Baseline: need to assess Goal status: INITIAL  ASSESSMENT:  CLINICAL  IMPRESSION: Patient is a 48 y.o. male who was seen today for physical therapy evaluation and treatment for R hemiplegia (UE>LE) with increased tone, impaired balance, impaired functional mobility, impaired gait, and increased fall risk following CVA in 2020. Mr. Cifelli is highly motivated to participate in therapy and improve  his ability to walk. As noted above, pt demonstrates significant impairments in R LE strength and ROM as well as increased tone and significant sensory deficits. He demonstrates significant fall risk as noted by a gait speed of 0.83m/s on 10 meter walk test requiring CGA and 1x min assist for balance safety. Therapist initiated HEP focusing on improving L LE ROM as pt rests in excessive hip external rotation and abduction, which results in impaired alignment of the LE during functional mobility. Mr. Clopper will benefit from further skilled PT to improve these deficits in order to increase QOL and ease/safety with ADLs.    OBJECTIVE IMPAIRMENTS: Abnormal gait, cardiopulmonary status limiting activity, decreased activity tolerance, decreased balance, decreased coordination, decreased endurance, decreased knowledge of use of DME, decreased mobility, difficulty walking, decreased ROM, decreased strength, hypomobility, impaired flexibility, impaired sensation, impaired tone, impaired UE functional use, improper body mechanics, postural dysfunction, and pain.   ACTIVITY LIMITATIONS: carrying, lifting, bending, standing, squatting, sleeping, stairs, transfers, bed mobility, continence, bathing, toileting, dressing, reach over head, hygiene/grooming, locomotion level, and caring for others  PARTICIPATION LIMITATIONS: meal prep, cleaning, laundry, medication management, shopping, community activity, and yard work  PERSONAL FACTORS: Age, Past/current experiences, Time since onset of injury/illness/exacerbation, Transportation, and 3+ comorbidities: HTN, hx of kidney stones, depression  are also  affecting patient's functional outcome.   REHAB POTENTIAL: Good  CLINICAL DECISION MAKING: Evolving/moderate complexity  EVALUATION COMPLEXITY: Moderate  PLAN:  PT FREQUENCY: 1-2x/week  PT DURATION: 12 weeks  PLANNED INTERVENTIONS: [id number]- PT Re-evaluation, 97750- Physical Performance Testing, 97110-Therapeutic exercises, 97530- Therapeutic activity, [id number]- Neuromuscular re-education, 97535- Self Care, [id number]- Manual therapy, [id number]- Gait training, [phone]- Orthotic/Prosthetic subsequent, [phone]- Canalith repositioning, [phone]- Electrical stimulation (manual), Patient/Family education, Balance training, Stair training, Taping, Dry Needling, Joint mobilization, Joint manipulation, Vestibular training, Visual/preceptual remediation/compensation, DME instructions, Cryotherapy, Moist heat, and Biofeedback  PLAN FOR NEXT SESSION:  - Stroke Impact Scale - 16 - 5x STS - Berg Balance Test - TUG - walk test - stairs - progress HEP when appropriate   Leita Lindbloom, PT, DPT, NCS, CSRS Physical Therapist - [facility]  4:44 PM 03/10/24

## 2024-03-15 ENCOUNTER — Ambulatory Visit: Admitting: Physical Therapy

## 2024-03-15 DIAGNOSIS — I639 Cerebral infarction, unspecified: Secondary | ICD-10-CM | POA: Diagnosis not present

## 2024-03-15 DIAGNOSIS — R278 Other lack of coordination: Secondary | ICD-10-CM | POA: Diagnosis not present

## 2024-03-15 DIAGNOSIS — R2681 Unsteadiness on feet: Secondary | ICD-10-CM

## 2024-03-15 DIAGNOSIS — M6281 Muscle weakness (generalized): Secondary | ICD-10-CM

## 2024-03-15 DIAGNOSIS — I69354 Hemiplegia and hemiparesis following cerebral infarction affecting left non-dominant side: Secondary | ICD-10-CM

## 2024-03-15 DIAGNOSIS — R2689 Other abnormalities of gait and mobility: Secondary | ICD-10-CM | POA: Diagnosis not present

## 2024-03-15 DIAGNOSIS — R29818 Other symptoms and signs involving the nervous system: Secondary | ICD-10-CM | POA: Diagnosis not present

## 2024-03-15 NOTE — Therapy (Signed)
 OUTPATIENT PHYSICAL THERAPY NEURO TREATMENT   Patient Name: Matthew Black MRN: [id number] DOB:1976-01-16, 48 y.o., male Today's Date: 03/15/2024   PCP: Sylvia Everts, PA-C REFERRING PROVIDER: Liam Redhead, MD  END OF SESSION:   PT End of Session - 03/15/24 1258     Visit Number 2    Number of Visits 24    Date for PT Re-Evaluation 06/02/24    PT Start Time 1316    PT Stop Time 1404    PT Time Calculation (min) 48 min    Equipment Utilized During Treatment Gait belt;Other (comment)   L LE custom AFO   Activity Tolerance Patient tolerated treatment well    Behavior During Therapy [facility] for tasks assessed/performed              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   Stroke (HCC)    RICA, R A1, R MCA occlusion 07/19/19   Past Surgical History:  Procedure Laterality Date   CRANIOPLASTY Right 06/05/2020   Procedure: CRANIOPLASTY;  Surgeon: Augusto Blonder, MD;  Location: [facility] OR;  Service: Neurosurgery;  Laterality: Right;  right   CRANIOTOMY Right 07/19/2019   Procedure: RIGHT HEMI-CRANIECTOMY With implantation of skull flap to abdominal wall;  Surgeon: Augusto Blonder, MD;  Location: [facility] OR;  Service: Neurosurgery;  Laterality: Right;   CYST EXCISION N/A 10/08/2016   Procedure: EXCISION OF POSTERIOR NECK CYST;  Surgeon: Adalberto Acton, MD;  Location: [facility] ORS;  Service: General;  Laterality: N/A;   CYSTOSCOPY/URETEROSCOPY/HOLMIUM LASER/STENT PLACEMENT Right 03/16/2020   Procedure: CYSTOSCOPY RIGHT RETROGRADE PYELOGRAM URETEROSCOPY/HOLMIUM LASER/STENT  PLACEMENT;  Surgeon: Samson Croak, MD;  Location: [facility]S;  Service: Urology;  Laterality: Right;   INCISION AND DRAINAGE ABSCESS N/A 09/22/2014   Procedure: INCISION AND DRAINAGE ABSCESS POSTERIOR NECK;  Surgeon: Azucena Bollard, MD;  Location: [facility]S;  Service: General;  Laterality: N/A;   INCISION AND DRAINAGE ABSCESS N/A 12/20/2015   Procedure: INCISION AND DRAINAGE POSTERIOR NECK MASS;  Surgeon: Oralee Billow, MD;  Location: [facility];  Service: General;  Laterality: N/A;   INCISION AND DRAINAGE ABSCESS Left 07/10/2004   middle finger   IR IVC FILTER PLMT / S&I /IMG GUID/MOD SED  07/21/2019   IR RADIOLOGIST EVAL & MGMT  12/14/2019   IR RADIOLOGIST EVAL & MGMT  12/21/2020   IR VENOGRAM RENAL UNI RIGHT  07/21/2019   MASS EXCISION N/A 07/21/2017   Procedure: EXCISION OF BENIGN NECK LESION WITH LAYERED CLOSURE;  Surgeon: Alger Infield, MD;  Location: [facility];  Service: Plastics;  Laterality: N/A;   MASS EXCISION N/A 11/10/2017   Procedure: EXCISION BENIGN LESION OF THE NECK WITH LAYERED CLOSURE;  Surgeon: Alger Infield, MD;  Location: [facility];  Service: Plastics;  Laterality: N/A;   Patient Active Problem List   Diagnosis Date Noted   Hx of adenomatous colonic polyps 06/01/2023   COVID-19 virus infection 04/21/2021   Left-sided weakness 04/21/2021   S/P craniotomy 06/05/2020   History of cranioplasty 06/05/2020  Peri-rectal abscess 02/14/2020   Abnormal CT scan, pelvis 02/14/2020   Pancytopenia (HCC) 12/02/2019   Nephrolithiasis 12/02/2019   Hydronephrosis with renal and ureteral calculus obstruction 12/02/2019   Rectal pain 12/02/2019   Hyperkalemia 12/02/2019   Reactive depression    Wound infection after surgery    Sleep disturbance    Dysphagia, post-stroke    Transaminitis    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   Cerebral abscess    Urinary tract infection without hematuria    Altered mental status    Primary  hypercoagulable state (HCC)    Acute pulmonary embolism without acute cor pulmonale (HCC)    Deep vein thrombosis (DVT) of non-extremity vein    Hypokalemia    Acute blood loss anemia    Leukocytosis    Endotracheal tube present    Acute respiratory failure with hypoxemia (HCC)    Stroke (cerebrum) (HCC) 07/19/2019   Pressure injury of skin 07/19/2019   Acute CVA (cerebrovascular accident) (HCC)    Encephalopathy    Dysphagia    Acute encephalopathy    Essential hypertension    Obesity 03/12/2016   Scalp abscess 12/20/2015   Neck abscess 12/20/2015   Pilonidal cyst 02/08/2013    ONSET DATE: stroke in August 2020  REFERRING DIAG: I63.9 (ICD-10-CM) - Cerebrovascular accident (CVA), unspecified mechanism (HCC)   THERAPY DIAG:  Muscle weakness (generalized)  Other lack of coordination  Hemiplegia and hemiparesis following cerebral infarction affecting left non-dominant side (HCC)  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)  Other abnormalities of gait and mobility  Unsteadiness on feet  Rationale for Evaluation and Treatment: Rehabilitation  SUBJECTIVE:                                                                                                                                                                                             SUBJECTIVE STATEMENT:  Pt reports he has been "trying to do his knee exercise" but can't do it by himself (specifically the one in sitting, not the one in supine). Patient denies any falls/stumbles since last visit. Pt denies pain to start session.  Initial Eval: Patient reports his goal is to walk without using a cane and without assistance. Patient states he wants to walk like he did before having the stroke. Reports his stroke was in August of 2020. States he currently uses a quad cane for ambulation and tries to walk as much as possible. Reports he has a manual wheelchair, but states he tries  not to use it because it depresses him and makes him feel like he  is back to when he first had his stroke. Reports wearing L LE custom AFO at all times.   Reports he is currently doing everything mod-I, but states he could call his mom, dad, or brothers if he needed assistance.  Reports he has routine botox  injections in L UE to help manage tone, but doesn't receive any in his LE.  Reports he has a hospital bed, but sleeps in a regular bed.  Pt accompanied by: self  PERTINENT HISTORY: L hemiparesis s/p R ACA and MCA territory CVA in August 2020, HTN, hx of kidney stones, depression  Per Neurology MD note on 11/24/2023: 17-year-old African-American male with large right hemispheric infarct due to right internal carotid and middle cerebral artery occlusion with cytotoxic edema and brain herniation s/p hemicraniectomy in 06/2019 is doing reasonably well with residual left spastic hemiparesis and left hemianopia.  Etiology of carotid occlusion unclear possibly dissection.  He had a prolonged hospital admission with several complications including DVT, pulmonary embolism, hemorrhagic transformation, abdominal wall hematoma, UTI but made quite remarkable recovery   PAIN:  Are you having pain? No, denies pain today, but states sometimes will have L shoulder pain  PRECAUTIONS: Fall  RED FLAGS: None   WEIGHT BEARING RESTRICTIONS: No  FALLS: Has patient fallen in last 6 months? 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  DIAGNOSTIC FINDINGS:  EXAM: CT ANGIOGRAPHY HEAD AND NECK WITH AND WITHOUT CONTRAST IMPRESSION: 1. No acute intracranial abnormality. 2. Chronically occluded right ICA with minimal opacification of the right MCA and ACA branches. 3. Redemonstrated extensive encephalomalacia in the right MCA and ACA territories secondary to a prior infarct.  Electronically Signed   By: Clora Dane M.D.   On: 11/27/2023 13:45  COGNITION: Overall cognitive status: Within functional limits for tasks assessed   SENSATION: Light touch: Impaired in L LE Proprioception: Impaired  in L LE  Can feel deep pressure in L LE   COORDINATION: Impaired in L LE due to paresis, hypertonia, and overall decreased flexibility  EDEMA:  Not formally assessed  MUSCLE TONE: LLE: Mild, Moderate, and Hypertonic  MUSCLE LENGTH: Not formally assessed; however, demonstrates L hamstring muscle tightness  DTRs:  Not formally assessed  POSTURE: rounded shoulders, forward head, and weight shift right  LOWER EXTREMITY ROM:       Right Eval Active ROM Left Eval Passive ROM  Hip flexion WFL Achieves at least 90  Hip extension    Hip abduction    Hip adduction    Hip internal rotation  Unable to achieve neutral hip rotation, lacking internal rotation  Hip external rotation  [facility] and rests in excessive ER  Knee flexion [facility]  Knee extension [facility]  Able to achieve terminal knee extension in supine, but with significant discomfort due to hamstring tone  Ankle dorsiflexion WFL Did not remove AFO to assess this  date  Ankle plantarflexion WFL Did not remove AFO to assess this date  Ankle inversion    Ankle eversion     (Blank rows = not tested)  LOWER EXTREMITY MMT:    MMT Right Eval Left Eval  Hip flexion 4+ 2-  (compensates with hip adductor activation)  Hip extension    Hip abduction    Hip adduction  3+  Hip internal rotation  2-  Hip external rotation  2  Knee flexion 4+ 1  Knee extension 4+ 3+  Ankle dorsiflexion 4+ Did not remove AFO to assess this date  Ankle plantarflexion 4+ Did not remove AFO to assess this date  Ankle inversion    Ankle eversion    (Blank rows = not tested)  Manual Muscle Test Scale 0/5 = No muscle contraction can be seen or felt 1/5 = Contraction can be felt, but there is no motion 2-/5 = Part moves through incomplete ROM w/ gravity decreased 2/5 = Part moves through complete ROM w/ gravity decreased 2+/5 = Part moves through incomplete ROM (<50%) against gravity or through complete ROM w/ gravity 3-/5 = Part moves through incomplete ROM (>50%) against gravity 3/5 = Part moves through complete ROM against gravity 3+/5 = Part moves through complete ROM against gravity/slight resistance 4-/5= Holds test position against slight to moderate pressure 4/5 = Part moves through complete ROM against gravity/moderate resistance 4+/5= Holds test position against moderate to strong pressure 5/5 = Part moves through complete ROM against gravity/full resistance  BED MOBILITY: (sleeps in regular bed, but has hospital bed available) Findings: Sit to supine CGA Supine to sit Min A and Mod A Rolling to Right Mod A Rolling to Left Min A and Mod A  TRANSFERS: Sit to stand: CGA and Min A  Assistive device utilized: Counselling psychologist     Stand to sit: CGA and Min A  Assistive device utilized: Tree surgeon to chair: Min A  Assistive device utilized: Counselling psychologist       RAMP:  Not tested  CURB:  Not tested  STAIRS: Not tested *need  to assess*  GAIT: Findings: advances L LE with excessive hip external rotation using hip adductors, step to pattern, decreased arm swing- Left, decreased step length- Right, decreased stance time- Left, decreased stride length, decreased hip/knee flexion- Left, decreased ankle dorsiflexion- Left, lateral lean- Right, and decreased trunk rotation Distance walked: ~42ft Assistive device utilized: small based quad cane in R UE  Level of assistance: CGA and Min A  FUNCTIONAL TESTS:  5 times sit to stand: need to assess Timed up and go (TUG): need to assess 6 minute walk test: need to assess 10 meter walk test: 0.32m/s using small based quad cane and CGA Berg Balance Scale: need to assess  PATIENT SURVEYS:  Stroke Impact Scale 03/15/2024: 49/80  TREATMENT DATE: 03/15/2024   Pt arrives to clinic in transport chair with his personal SBQC (small based quad cane).    Stroke Impact Scale 16 (Copyrighted instrument, [facility])  In the past 2 weeks, how difficult was it to...  Rating Scale 5 = Not difficult at all 4 = A little difficult 3 = Somewhat difficult 2 = Very difficult 1 = Could not do at all  a. Dress the top part of your body? 4  b. Bathe yourself? 4  c. Get to the toilet on time? 5  d. Control your bladder (not have an accident)? 5  e. Control your bowels (not have an accident)? 5  f. Stand without losing balance? 2  g. Go shopping? 1  h. Do heavy household chores (e.g. vacuum, laundry or yard work)? 1  i. Stay sitting without losing your balance? 5  j. Walk without losing your balance? 3  k. Move from a bed to a chair? 2  l. Walk fast? 2  m. Climb one flight of stairs? 2 (using HR)  n. Walk one block? 1  o. Get in and out of a car? 2  p. Carry heavy objects (e.g. bag of groceries) with your  affected hand? 5  Sum:   49/80  MDC (Minimal Detectable Change) is >/=8     Five times Sit to Stand Test (FTSS) "Stand up and sit down as quickly as possible 5 times, keeping your arms folded across your chest."    TIME: 16.53 seconds from standard green chair relying on R hand to push-up from armrest and pt's personal SBQC nearby, but not using it  Time: 25.85 seconds with R hand across chest, requires min A for balance/safety without use of UE support *throughout STSs pt's L LE is "drawn up" in a knee flexed position with L foot making minimal to no contact with ground to allow WBing due to hypertonia  Times > 13.6 seconds is associated with increased disability and morbidity (Guralnik, 2000) Times > 15 seconds is predictive of recurrent falls in healthy individuals aged 41 and older (Buatois, et al., 2008) Normal performance values in community dwelling individuals aged 35 and older (Bohannon, 2006): 60-69 years: 11.4 seconds 70-79 years: 12.6 seconds 80-89 years: 14.8 seconds  MCID: >= 2.3 seconds for Vestibular Disorders (Meretta, 2006)    Participated in Timed Up and Go (TUG) using SBQC in R UE and requiring CGA for safety/steadying - pt pivoting hips to sit down in chair without turning fully and not putting weight through L LE: 1st trial: 28.26 seconds 2nd trial: 22.38 seconds  Average: 25.32 seconds using SBQC and chair with armrest as well as CGA for safety/steadying Patient demonstrates high fall risk as indicated by requiring >13.5seconds to complete the TUG.    Reports it has been at least several months since he had AFO assessed by [facility], noticed it is a little tight in the calf area, will continue to monitor, but may benefit from follow-up visit with [facility].   Reports he received his custom manual wheelchair from [facility] - therapist requested patient take a picture of wheelchair to show therapist. Pt reports he primarily sits in his w/c at home. Also states he has a transport chair that is  easier for him to get in/out of his car to take out into the community if needed.  Reviewed seated HEP exercise of hip adduction to improve his independence and success with it at home. Educated pt on placing  a pillow between his knees as this helps pt with activation of L hip adductors and pt completed x10 reps with noticeable effort to achieve L muscle activation, but pt successful.    Patient participated in [facility] and demonstrates increased fall risk as noted by score of  14/56.  (<36= high risk for falls, close to 100%; 37-45 significant >80%; 46-51 moderate >50%; 52-55 lower >25%). Patient continues to have L LE partially "drawn up" into hip/knee flexion lacking full foot contact with ground due to likely flexor tone, which is significantly impacting his balance.   [facility] PT Assessment - 03/15/24 0001       Berg Balance Test   Sit to Stand Able to stand  independently using hands    Standing Unsupported Able to stand 2 minutes with supervision   pt reports significant fatigue from this, pt with R lateral wt shift only standing on R LE throughout this   Sitting with Back Unsupported but Feet Supported on Floor or Stool Able to sit safely and securely 2 minutes    Stand to Sit Uses backs of legs against chair to control descent    Transfers Needs one person to assist   if not using SBQC   Standing Unsupported with Eyes Closed Unable to keep eyes closed 3 seconds but stays steady    Standing Unsupported with Feet Together Needs help to attain position and unable to hold for 15 seconds    From Standing, Reach Forward with Outstretched Arm Loses balance while trying/requires external support    From Standing Position, Pick up Object from Floor Unable to try/needs assist to keep balance    From Standing Position, Turn to Look Behind Over each Shoulder Needs assist to keep from losing balance and falling    Turn 360 Degrees Needs assistance while turning    Standing Unsupported,  Alternately Place Feet on Step/Stool Needs assistance to keep from falling or unable to try    Standing Unsupported, One Foot in [employer] balance while stepping or standing    Standing on One Leg Unable to try or needs assist to prevent fall    Total Score 14              Gait training <33ft to/from transport chair using SBQC with CGA/light min A for safety/steadying.   PATIENT EDUCATION: Education details: Therapy POC, LTGs, HEP, importance of improving L LE flexibility, findings during assessment  Person educated: Patient Education method: Explanation and Handouts Education comprehension: verbalized understanding and needs further education  HOME EXERCISE PROGRAM:  Access Code: [email or web address] URL: [email or web address] Date: 03/10/2024 Prepared by: Carlen Chasten  Exercises - Seated Hip Adduction Isometrics with Ball  - 1 x daily - 7 x weekly - 2 sets - 10 reps - 5 seconds hold - Supine Quadricep Sets  - 1 x daily - 7 x weekly - 2 sets - 10 reps - 3 seconds hold   GOALS: Goals reviewed with patient? Yes  SHORT TERM GOALS: Target date: 04/21/2024  Patient will be independent in home exercise program to improve strength/mobility for better functional independence with ADLs.  Baseline: initiated on 03/10/2024 Goal status: INITIAL   LONG TERM GOALS: Target date: 06/02/2024   Patient (< 55 years old) will complete five times sit to stand (5XSTS) test in < 10 seconds indicating an increased LE strength and improved balance.  Baseline: 03/15/2024: 16.53 seconds from standard green chair relying on R hand to push-up from armrest and pt's personal  SBQC nearby, but not using it VERSUS 25.85 seconds with R hand across chest, with min A for balance/safety without use of UE support Goal status: INITIAL  2.  Patient will increase Berg Balance score to > 45/56 to demonstrate improved balance and decreased fall risk during functional activities and ADLs.  Baseline:  03/15/2024: 14/56 Goal status: INITIAL  3.  Patient will increase 10 meter walk test to >1.36m/s using LRAD as to improve gait speed for better community ambulation and to reduce fall risk.  Baseline: 0.30 m/s using small based quad cane with CGA and 1x light min A due to balance instability  Goal status: INITIAL  4.  Patient will increase six minute walk test distance to >564ft for progression towards community ambulator and improve gait ability  Baseline: need to assess Goal status: INITIAL  5.  Patient will reduce timed up and go to <15 seconds to reduce fall risk and demonstrate improved transfer/gait ability. Baseline: 03/15/2024: 25.32 seconds using SBQC and chair with armrest as well as CGA for safety/steadying Goal status: INITIAL  6.  Patient will ascend/descend 4 stairs, using railing, independently without loss of balance to improve ability to get in/out of home.   Baseline: need to assess Goal status: INITIAL  ASSESSMENT:  CLINICAL IMPRESSION:  Patient is a 48 y.o. male who was seen today for physical therapy treatment for R hemiplegia (UE>LE) with increased tone, impaired balance, impaired functional mobility, impaired gait, and increased fall risk following CVA in 2020. Mr. Whittley is highly motivated to participate in therapy and improve his ability to walk. As noted above, pt demonstrates significant impairments in R LE strength and ROM as well as increased tone and significant sensory deficits impacting his functional mobility. Patient participated in 5x STS, TUG, and Berg Balance tests to assess his functional strength, safety with transfers and gait, as well as standing balance. Patient demonstrates significant deficits on all 3 assessments with pt impacted by increased L LE flexor tone limiting his ability to achieve weightbearing to allow pt to utilize it to improve his balance. Based on these assessments patient is at high fall risk. Mr. Diltz will benefit from further  skilled PT to improve these deficits in order to increase QOL and ease/safety with ADLs.    OBJECTIVE IMPAIRMENTS: Abnormal gait, cardiopulmonary status limiting activity, decreased activity tolerance, decreased balance, decreased coordination, decreased endurance, decreased knowledge of use of DME, decreased mobility, difficulty walking, decreased ROM, decreased strength, hypomobility, impaired flexibility, impaired sensation, impaired tone, impaired UE functional use, improper body mechanics, postural dysfunction, and pain.   ACTIVITY LIMITATIONS: carrying, lifting, bending, standing, squatting, sleeping, stairs, transfers, bed mobility, continence, bathing, toileting, dressing, reach over head, hygiene/grooming, locomotion level, and caring for others  PARTICIPATION LIMITATIONS: meal prep, cleaning, laundry, medication management, shopping, community activity, and yard work  PERSONAL FACTORS: Age, Past/current experiences, Time since onset of injury/illness/exacerbation, Transportation, and 3+ comorbidities: HTN, hx of kidney stones, depression  are also affecting patient's functional outcome.   REHAB POTENTIAL: Good  CLINICAL DECISION MAKING: Evolving/moderate complexity  EVALUATION COMPLEXITY: Moderate  PLAN:  PT FREQUENCY: 1-2x/week  PT DURATION: 12 weeks  PLANNED INTERVENTIONS: 97164- PT Re-evaluation, 97750- Physical Performance Testing, 97110-Therapeutic exercises, 97530- Therapeutic activity, [id number]- Neuromuscular re-education, 97535- Self Care, 16109- Manual therapy, [id number]- Gait training, [phone]- Orthotic/Prosthetic subsequent, [id number]- Canalith repositioning, [phone]- Electrical stimulation (manual), Patient/Family education, Balance training, Stair training, Taping, Dry Needling, Joint mobilization, Joint manipulation, Vestibular training, Visual/preceptual remediation/compensation, DME instructions, Cryotherapy, Moist heat, and Biofeedback  PLAN  FOR NEXT SESSION:  - monitor L LE  custom AFO fit to determine if patient needs follow-up with [facility] - follow-up on wheelchair picture - high intensity gait training using +2 HHA vs litegait support vs [place] crutch - L LE stretching and weightbearing for tone management and to improve alignment during gait/standing - walk test - stairs - progress HEP when appropriate   Clayton Jarmon, PT, DPT, NCS, CSRS Physical Therapist - [place]  [facility]  5:32 PM 03/15/24

## 2024-03-17 ENCOUNTER — Ambulatory Visit

## 2024-03-17 DIAGNOSIS — R29818 Other symptoms and signs involving the nervous system: Secondary | ICD-10-CM | POA: Diagnosis not present

## 2024-03-17 DIAGNOSIS — R2689 Other abnormalities of gait and mobility: Secondary | ICD-10-CM | POA: Diagnosis not present

## 2024-03-17 DIAGNOSIS — I69354 Hemiplegia and hemiparesis following cerebral infarction affecting left non-dominant side: Secondary | ICD-10-CM | POA: Diagnosis not present

## 2024-03-17 DIAGNOSIS — R2681 Unsteadiness on feet: Secondary | ICD-10-CM

## 2024-03-17 DIAGNOSIS — R278 Other lack of coordination: Secondary | ICD-10-CM

## 2024-03-17 DIAGNOSIS — I639 Cerebral infarction, unspecified: Secondary | ICD-10-CM | POA: Diagnosis not present

## 2024-03-17 DIAGNOSIS — M6281 Muscle weakness (generalized): Secondary | ICD-10-CM

## 2024-03-17 NOTE — Therapy (Signed)
 OUTPATIENT PHYSICAL THERAPY NEURO TREATMENT   Patient Name: Matthew Black MRN: [id number] DOB:1976/07/05, 48 y.o., male Today's Date: 03/17/2024   PCP: Sylvia Everts, PA-C REFERRING PROVIDER: Liam Redhead, MD  END OF SESSION:   PT End of Session - 03/17/24 1609     Visit Number 3    Number of Visits 24    Date for PT Re-Evaluation 06/02/24    Authorization Type [employer]IC[employer]    Authorization Time Period 03/10/24-06/02/24    PT Start Time 1340   Pt arived early   PT Stop Time 1420    PT Time Calculation (min) 40 min    Equipment Utilized During Treatment Gait belt    Activity Tolerance Patient tolerated treatment well;No increased pain;Patient limited by fatigue    Behavior During Therapy [facility] for tasks assessed/performed              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   Stroke (HCC)    RICA, R A1, R MCA occlusion 07/19/19   Past Surgical History:  Procedure Laterality Date   CRANIOPLASTY Right 06/05/2020   Procedure: CRANIOPLASTY;  Surgeon: Augusto Blonder, MD;  Location: [facility] OR;  Service: Neurosurgery;  Laterality: Right;  right   CRANIOTOMY Right 07/19/2019   Procedure: RIGHT HEMI-CRANIECTOMY With implantation of skull flap to abdominal wall;  Surgeon: Augusto Blonder, MD;  Location: [facility] OR;  Service: Neurosurgery;  Laterality: Right;   CYST EXCISION N/A 10/08/2016   Procedure: EXCISION OF POSTERIOR NECK CYST;  Surgeon: Adalberto Acton, MD;  Location: [facility] ORS;  Service: General;  Laterality: N/A;   CYSTOSCOPY/URETEROSCOPY/HOLMIUM  LASER/STENT PLACEMENT Right 03/16/2020   Procedure: CYSTOSCOPY RIGHT RETROGRADE PYELOGRAM URETEROSCOPY/HOLMIUM LASER/STENT PLACEMENT;  Surgeon: Samson Croak, MD;  Location: [facility];  Service: Urology;  Laterality: Right;   INCISION AND DRAINAGE ABSCESS N/A 09/22/2014   Procedure: INCISION AND DRAINAGE ABSCESS POSTERIOR NECK;  Surgeon: Azucena Bollard, MD;  Location: [facility];  Service: General;  Laterality: N/A;   INCISION AND DRAINAGE ABSCESS N/A 12/20/2015   Procedure: INCISION AND DRAINAGE POSTERIOR NECK MASS;  Surgeon: Oralee Billow, MD;  Location: [facility];  Service: General;  Laterality: N/A;   INCISION AND DRAINAGE ABSCESS Left 07/10/2004   middle finger   IR IVC FILTER PLMT / S&I /IMG GUID/MOD SED  07/21/2019   IR RADIOLOGIST EVAL & MGMT  12/14/2019   IR RADIOLOGIST EVAL & MGMT  12/21/2020   IR VENOGRAM RENAL UNI RIGHT  07/21/2019   MASS EXCISION N/A 07/21/2017   Procedure: EXCISION OF BENIGN NECK LESION WITH LAYERED CLOSURE;  Surgeon: Alger Infield, MD;  Location: [facility];  Service: Plastics;  Laterality: N/A;   MASS EXCISION N/A 11/10/2017   Procedure: EXCISION BENIGN LESION OF THE NECK WITH LAYERED CLOSURE;  Surgeon: Alger Infield, MD;  Location: [facility];  Service: Plastics;  Laterality: N/A;   Patient Active Problem List   Diagnosis Date Noted   Hx of adenomatous colonic polyps 06/01/2023   COVID-19 virus infection  04/21/2021   Left-sided weakness 04/21/2021   S/P craniotomy 06/05/2020   History of cranioplasty 06/05/2020   Peri-rectal abscess 02/14/2020   Abnormal CT scan, pelvis 02/14/2020   Pancytopenia (HCC) 12/02/2019   Nephrolithiasis 12/02/2019   Hydronephrosis with renal and ureteral calculus obstruction 12/02/2019   Rectal pain 12/02/2019   Hyperkalemia 12/02/2019   Reactive depression    Wound infection after surgery    Sleep disturbance    Dysphagia, post-stroke    Transaminitis    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   Cerebral abscess    Urinary tract infection without hematuria    Altered mental status    Primary hypercoagulable state (HCC)    Acute pulmonary embolism without acute cor pulmonale (HCC)    Deep vein thrombosis (DVT) of non-extremity vein    Hypokalemia    Acute blood loss anemia    Leukocytosis    Endotracheal tube present    Acute respiratory failure with hypoxemia (HCC)    Stroke (cerebrum) (HCC) 07/19/2019   Pressure injury of skin 07/19/2019   Acute CVA (cerebrovascular accident) (HCC)    Encephalopathy    Dysphagia    Acute encephalopathy    Essential hypertension    Obesity 03/12/2016   Scalp abscess 12/20/2015   Neck abscess 12/20/2015   Pilonidal cyst 02/08/2013    ONSET DATE: stroke in August 2020  REFERRING DIAG: I63.9 (ICD-10-CM) - Cerebrovascular accident (CVA), unspecified mechanism (HCC)   THERAPY DIAG:  Muscle weakness (generalized)  Other lack of coordination  Other abnormalities of gait and mobility  Unsteadiness on feet  Rationale for Evaluation and Treatment: Rehabilitation  SUBJECTIVE:                                                                                                                                                                                             SUBJECTIVE STATEMENT: No medical updates since prior visit. Pt says unable to perform HEP activity with ball squeeze as indicated. No other relevant updates.    Initial Eval: Patient reports his goal is to walk without using a cane and without assistance. Patient states he wants to walk like he did before having the stroke. Reports his stroke was in August of 2020. States he currently uses a quad cane for ambulation and tries to walk as much as possible. Reports he has a manual wheelchair, but states he tries not to use it because it depresses him and makes him feel like he is back to when he first had his stroke. Reports wearing L LE custom AFO at all times.    Reports he is currently  doing everything mod-I, but states he could call his mom, dad, or brothers if he needed assistance.  Reports he has routine botox  injections in L UE to help manage tone, but doesn't receive any in his LE.  Reports he has a hospital bed, but sleeps in a regular bed.  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  Etiology of carotid occlusion unclear possibly dissection.  He had a prolonged hospital admission with several complications including DVT, pulmonary embolism, hemorrhagic transformation, abdominal wall hematoma, UTI but made quite remarkable recovery   PAIN:  Are you having pain? No, denies pain today, but states sometimes will have L shoulder pain  PRECAUTIONS: Fall  RED FLAGS: None   WEIGHT BEARING RESTRICTIONS: No  FALLS: Has patient fallen in last 6 months? 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, and pt overall at a modified independent household level for  functional mobility requiring significantly increased time and modifications to complete tasks safely  PATIENT GOALS: To improve walking and be able to walk without a cane  OBJECTIVE:                                                                                                                   TREATMENT DATE: 03/17/2024  - : 293ft with SBQC, no breaks;  has some substernal chest pain x60sec after: 150/27mmHg, HR 68bpm, SpO2 98%  -Stairs assessment: 4 steps up, 4 steps down, AFO in place, self management of SBQC demonstrated as he performs daily for home entry; modified independent performance.  -high intensity interval gait training: 3x~30sec at 8-9/10 level effort AMB, 60sec recovery seated;  7.5lb ankle weight on Left ankle, gait belt, minGuard assist provided, no LOB, mild to moder SOB with each x25sec.   27sec for each interval of ~66ft  PATIENT EDUCATION: Education details: discussed role of HIIT as an intevention in a neuro setting.  Person educated: Patient Education method: Explanation and Handouts Education comprehension: verbalized understanding and needs further education  HOME EXERCISE PROGRAM: Access Code: [email or web address] URL: [email or web address] Date: 03/10/2024 Prepared by: Carlen Chasten  Exercises - Seated Hip Adduction Isometrics with Celeste Cola  -  1 x daily - 7 x weekly - 2 sets - 10 reps - 5 seconds hold - Supine Quadricep Sets  - 1 x daily - 7 x weekly - 2 sets - 10 reps - 3 seconds hold  GOALS: Goals reviewed with patient? Yes  SHORT TERM GOALS: Target date: 04/21/2024  Patient will be independent in home exercise program to improve strength/mobility for better functional independence with ADLs.  Baseline: initiated on 03/10/2024 Goal status: INITIAL  LONG TERM GOALS: Target date: 06/02/2024  Patient (< 82 years old) will complete five times sit to stand (5XSTS) test in < 10 seconds indicating an increased LE strength and improved balance.   Baseline: 03/15/2024: 16.53 seconds from standard green chair relying on R hand to push-up from armrest and pt's personal SBQC nearby, but not using it VERSUS 25.85 seconds with R hand across chest, with min A for balance/safety without use of UE support Goal status: INITIAL  2.  Patient will increase Berg Balance score to > 45/56 to demonstrate improved balance and decreased fall risk during functional activities and ADLs.  Baseline: 03/15/2024: 14/56 Goal status: INITIAL  3.  Patient will increase 10 meter walk test to >1.51m/s using LRAD as to improve gait speed for better community ambulation and to reduce fall risk.  Baseline: 0.30 m/s using small based quad cane with CGA and 1x light min A due to balance instability  Goal status: INITIAL  4.  Patient will increase six minute walk test distance to >569ft for progression towards community ambulator and improve gait ability  Baseline: 03/17/24: 234ft c SBQC, no breaks  Goal status: making some progress   5.  Patient will reduce timed up and go to <15 seconds to reduce fall risk and demonstrate improved transfer/gait ability. Baseline: 03/15/2024: 25.32 seconds using SBQC and chair with armrest as well as CGA for safety/steadying Goal status: INITIAL  6.  Patient will ascend/descend 4 stairs, using railing, independently without loss of balance to improve ability to get in/out of home.   Baseline: 03/17/24: formal assessment of 8 stairs at supervision level ad lib, performs at home.  Goal status: achieved  ASSESSMENT:  CLINICAL IMPRESSION:  Completed remaining assessment measures this date ( and stairs assessment). shows improvement compared to remote assessment seen in medical record, however total distance revealing of significant difficulty with overground AMB and speaks to difficulty in accessing the community as pt has severe loss of ergonomics in locomotion. Additionally, pt has not walked continuously for 6 minutes in a very  long time, but is able to maintain appropriate safety behaviors and maintain balance. Pt does reports some high exertion toward end of testing and substernal chest pain for ~60 seconds after transition to sitting- VSS stable. Pt demonstrates how he navigates entry stairs alone for home entry. Initiated high intensity intervals in gait training as planned. Will continue to advance interventions as tolerated to achieve goals of care.   OBJECTIVE IMPAIRMENTS: Abnormal gait, cardiopulmonary status limiting activity, decreased activity tolerance, decreased balance, decreased coordination, decreased endurance, decreased knowledge of use of DME, decreased mobility, difficulty walking, decreased ROM, decreased strength, hypomobility, impaired flexibility, impaired sensation, impaired tone, impaired UE functional use, improper body mechanics, postural dysfunction, and pain.   ACTIVITY LIMITATIONS: carrying, lifting, bending, standing, squatting, sleeping, stairs, transfers, bed mobility, continence, bathing, toileting, dressing, reach over head, hygiene/grooming, locomotion level, and caring for others  PARTICIPATION LIMITATIONS: meal prep, cleaning, laundry, medication management, shopping, community activity, and yard work  PERSONAL FACTORS:  Age, Past/current experiences, Time since onset of injury/illness/exacerbation, Transportation, and 3+ comorbidities: HTN, hx of kidney stones, depression  are also affecting patient's functional outcome.   REHAB POTENTIAL: Good  CLINICAL DECISION MAKING: Evolving/moderate complexity  EVALUATION COMPLEXITY: Moderate  PLAN:  PT FREQUENCY: 1-2x/week  PT DURATION: 12 weeks  PLANNED INTERVENTIONS: 97164- PT Re-evaluation, 97750- Physical Performance Testing, 97110-Therapeutic exercises, 97530- Therapeutic activity, 97112- Neuromuscular re-education, 97535- Self Care, [id number]- Manual therapy, [phone]- Gait training, [phone]- Orthotic/Prosthetic subsequent, [phone]- Canalith  repositioning, [phone]- Electrical stimulation (manual), Patient/Family education, Balance training, Stair training, Taping, Dry Needling, Joint mobilization, Joint manipulation, Vestibular training, Visual/preceptual remediation/compensation, DME instructions, Cryotherapy, Moist heat, and Biofeedback  PLAN FOR NEXT SESSION:  - monitor L LE custom AFO fit to determine if patient needs follow-up with [facility] - follow-up on wheelchair picture - high intensity gait training using +2 HHA vs litegait support vs [place] crutch - L LE stretching and weightbearing for tone management and to improve alignment during gait/standing - progress HEP when appropriate  4:15 PM, 03/17/24 Dawn Eth, PT, DPT Physical Therapist - [facility]  Outpatient Physical Therapy- Main [facility] [phone]     4:14 PM 03/17/24

## 2024-03-22 ENCOUNTER — Ambulatory Visit: Admitting: Physical Therapy

## 2024-03-22 ENCOUNTER — Encounter: Payer: Medicare PPO | Attending: Physical Medicine and Rehabilitation | Admitting: Physical Medicine and Rehabilitation

## 2024-03-22 DIAGNOSIS — I69352 Hemiplegia and hemiparesis following cerebral infarction affecting left dominant side: Secondary | ICD-10-CM

## 2024-03-22 NOTE — Progress Notes (Signed)
 Botox  for muscle spasticity  Dilution: 1:1 Units/ml Indication: Severe spasticity which interferes with ADL,mobility and/or  hygiene and is unresponsive to medication management and other conservative care Informed consent was obtained after describing risks and benefits of the procedure with the patient. This includes bleeding, bruising, infection, excessive weakness, or medication side effects. A REMS form is on file and signed.  Left biceps 100U divided into 2 spots  All injections were done after obtaining appropriate US  visualization and after negative drawback for blood. The patient tolerated the procedure well. Post procedure instructions were given. A followup appointment was made.

## 2024-03-23 MED ORDER — ONABOTULINUMTOXINA 100 UNITS IJ SOLR
100.0000 [IU] | Freq: Once | INTRAMUSCULAR | Status: AC
Start: 2024-03-23 — End: 2024-03-22
  Administered 2024-03-22: 100 [IU] via INTRAMUSCULAR

## 2024-03-23 NOTE — Addendum Note (Signed)
 Addended by: Avelina Bode T on: 03/23/2024 08:21 AM   Modules accepted: Orders

## 2024-03-24 ENCOUNTER — Ambulatory Visit: Attending: Physical Medicine and Rehabilitation | Admitting: Physical Therapy

## 2024-03-24 DIAGNOSIS — R2689 Other abnormalities of gait and mobility: Secondary | ICD-10-CM | POA: Diagnosis not present

## 2024-03-24 DIAGNOSIS — M6281 Muscle weakness (generalized): Secondary | ICD-10-CM | POA: Diagnosis not present

## 2024-03-24 DIAGNOSIS — R29818 Other symptoms and signs involving the nervous system: Secondary | ICD-10-CM | POA: Diagnosis not present

## 2024-03-24 DIAGNOSIS — R278 Other lack of coordination: Secondary | ICD-10-CM

## 2024-03-24 DIAGNOSIS — R2681 Unsteadiness on feet: Secondary | ICD-10-CM

## 2024-03-24 DIAGNOSIS — I69354 Hemiplegia and hemiparesis following cerebral infarction affecting left non-dominant side: Secondary | ICD-10-CM | POA: Diagnosis not present

## 2024-03-24 NOTE — Therapy (Signed)
 OUTPATIENT PHYSICAL THERAPY NEURO TREATMENT   Patient Name: Matthew Black MRN: [id number] DOB:February 07, 1976, 48 y.o., male Today's Date: 03/24/2024   PCP: Sylvia Everts, PA-C REFERRING PROVIDER: Liam Redhead, MD  END OF SESSION:   PT End of Session - 03/24/24 1453     Visit Number 4    Number of Visits 24    Date for PT Re-Evaluation 06/02/24    Authorization Type [employer]ICARE    Authorization Time Period 03/10/24-06/02/24    PT Start Time 1453    PT Stop Time 1538    PT Time Calculation (min) 45 min    Equipment Utilized During Treatment Gait belt    Activity Tolerance Patient tolerated treatment well    Behavior During Therapy WFL for tasks assessed/performed               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   Stroke (HCC)    RICA, R A1, R MCA occlusion 07/19/19   Past Surgical History:  Procedure Laterality Date   CRANIOPLASTY Right 06/05/2020   Procedure: CRANIOPLASTY;  Surgeon: Augusto Blonder, MD;  Location: [facility] OR;  Service: Neurosurgery;  Laterality: Right;  right   CRANIOTOMY Right 07/19/2019   Procedure: RIGHT HEMI-CRANIECTOMY With implantation of skull flap to abdominal wall;  Surgeon: Augusto Blonder, MD;  Location: [facility];  Service: Neurosurgery;  Laterality: Right;   CYST EXCISION N/A 10/08/2016   Procedure: EXCISION OF POSTERIOR NECK CYST;  Surgeon: Adalberto Acton, MD;  Location: [facility] ORS;  Service: General;  Laterality: N/A;   CYSTOSCOPY/URETEROSCOPY/HOLMIUM LASER/STENT PLACEMENT Right 03/16/2020   Procedure: CYSTOSCOPY RIGHT  RETROGRADE PYELOGRAM URETEROSCOPY/HOLMIUM LASER/STENT PLACEMENT;  Surgeon: Samson Croak, MD;  Location: [facility];  Service: Urology;  Laterality: Right;   INCISION AND DRAINAGE ABSCESS N/A 09/22/2014   Procedure: INCISION AND DRAINAGE ABSCESS POSTERIOR NECK;  Surgeon: Azucena Bollard, MD;  Location: [facility];  Service: General;  Laterality: N/A;   INCISION AND DRAINAGE ABSCESS N/A 12/20/2015   Procedure: INCISION AND DRAINAGE POSTERIOR NECK MASS;  Surgeon: Oralee Billow, MD;  Location: [facility];  Service: General;  Laterality: N/A;   INCISION AND DRAINAGE ABSCESS Left 07/10/2004   middle finger   IR IVC FILTER PLMT / S&I /IMG GUID/MOD SED  07/21/2019   IR RADIOLOGIST EVAL & MGMT  12/14/2019   IR RADIOLOGIST EVAL & MGMT  12/21/2020   IR VENOGRAM RENAL UNI RIGHT  07/21/2019   MASS EXCISION N/A 07/21/2017   Procedure: EXCISION OF BENIGN NECK LESION WITH LAYERED CLOSURE;  Surgeon: Alger Infield, MD;  Location: [facility];  Service: Plastics;  Laterality: N/A;   MASS EXCISION N/A 11/10/2017   Procedure: EXCISION BENIGN LESION OF THE NECK WITH LAYERED CLOSURE;  Surgeon: Alger Infield, MD;  Location: [facility];  Service: Plastics;  Laterality: N/A;   Patient Active Problem List   Diagnosis Date Noted   Hx of adenomatous colonic polyps 06/01/2023   COVID-19 virus infection 04/21/2021   Left-sided weakness 04/21/2021  S/P craniotomy 06/05/2020   History of cranioplasty 06/05/2020   Peri-rectal abscess 02/14/2020   Abnormal CT scan, pelvis 02/14/2020   Pancytopenia (HCC) 12/02/2019   Nephrolithiasis 12/02/2019   Hydronephrosis with renal and ureteral calculus obstruction 12/02/2019   Rectal pain 12/02/2019   Hyperkalemia 12/02/2019   Reactive depression    Wound infection after surgery    Sleep disturbance    Dysphagia, post-stroke    Transaminitis    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   Cerebral abscess    Urinary tract infection without  hematuria    Altered mental status    Primary hypercoagulable state (HCC)    Acute pulmonary embolism without acute cor pulmonale (HCC)    Deep vein thrombosis (DVT) of non-extremity vein    Hypokalemia    Acute blood loss anemia    Leukocytosis    Endotracheal tube present    Acute respiratory failure with hypoxemia (HCC)    Stroke (cerebrum) (HCC) 07/19/2019   Pressure injury of skin 07/19/2019   Acute CVA (cerebrovascular accident) (HCC)    Encephalopathy    Dysphagia    Acute encephalopathy    Essential hypertension    Obesity 03/12/2016   Scalp abscess 12/20/2015   Neck abscess 12/20/2015   Pilonidal cyst 02/08/2013    ONSET DATE: stroke in August 2020  REFERRING DIAG: I63.9 (ICD-10-CM) - Cerebrovascular accident (CVA), unspecified mechanism (HCC)   THERAPY DIAG:   Muscle weakness (generalized)  Other lack of coordination  Other abnormalities of gait and mobility  Unsteadiness on feet  Hemiplegia and hemiparesis following cerebral infarction affecting left non-dominant side (HCC)  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)  Other symptoms and signs involving the nervous system  Rationale for Evaluation and Treatment: Rehabilitation  SUBJECTIVE:                                                                                                                                                                                             SUBJECTIVE STATEMENT:  Pt reports he had gone a whole year without falling, but fell this past Friday on 4/25. States he was at home trying to bring groceries into the house, but when he sat the groceries down and went to turn and grab his small based quad cane, it tipped over and he fell. States he "blacked out" when hitting his head on the concrete and then started having L leg and R shoulder pain. Pt states he is still sore today from the fall.  Pt states he didn't pass out before falling,  but rather passed out because  he hit his head when he fell. Pt states EMS came to check him out and wanted him to go to the hospital, but he declined because he didn't want to be pricked with needles.  Pt had botox  in L UE yesterday and reports MD is in agreement with therapist's thoughts of putting botox  in his L LE hamstrings at his next appointment in 3 months.   Pt states he forgot to take a picture of his wheelchair.  Pt states he has been trying to do the seated ball squeeze in his w/c, but can't do it b/c L foot is elevated on foot rest, so he has to do it in the recliner. States he is able to activate L hip adductors when sitting in recliner successfully to do the exercise.    Initial Eval: Patient reports his goal is to walk without using a cane and without assistance. Patient states he wants to walk like he did before having the stroke. Reports his stroke was in August of 2020. States he currently uses a quad cane for ambulation and tries to walk as much as possible. Reports he has a manual wheelchair, but states he tries not to use it because it depresses him and makes him feel like he is back to when he first had his stroke. Reports wearing L LE custom AFO at all times.   Reports he is currently doing everything mod-I, but states he could call his mom, dad, or brothers if he needed assistance.  Reports he has routine botox  injections in L UE to help manage tone, but doesn't receive any in his LE.  Reports he has a hospital bed, but sleeps in a regular bed.  Pt accompanied by: self  PERTINENT HISTORY: L hemiparesis s/p R ACA and MCA territory CVA in August 2020, HTN, hx of kidney stones, depression  Per Neurology MD note on 11/24/2023: 30-year-old African-American male with large right hemispheric infarct due to right internal carotid and middle cerebral artery occlusion with cytotoxic edema and brain herniation s/p hemicraniectomy in 06/2019 is doing reasonably well with residual left  spastic hemiparesis and left hemianopia.  Etiology of carotid occlusion unclear possibly dissection.  He had a prolonged hospital admission with several complications including DVT, pulmonary embolism, hemorrhagic transformation, abdominal wall hematoma, UTI but made quite remarkable recovery   PAIN:  Are you having pain? No, denies pain today, but states sometimes will have L shoulder pain  PRECAUTIONS: Fall  RED FLAGS: None   WEIGHT BEARING RESTRICTIONS: No  FALLS: Has patient fallen in last 6 months? 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  DIAGNOSTIC FINDINGS:  EXAM: CT ANGIOGRAPHY HEAD AND NECK WITH AND WITHOUT CONTRAST IMPRESSION: 1.  No acute intracranial abnormality. 2. Chronically occluded right ICA with minimal opacification of the right MCA and ACA branches. 3. Redemonstrated extensive encephalomalacia in the right MCA and ACA territories secondary to a prior infarct.  Electronically Signed   By: Clora Dane M.D.   On: 11/27/2023 13:45  COGNITION: Overall cognitive status: Within  functional limits for tasks assessed   SENSATION: Light touch: Impaired in L LE Proprioception: Impaired  in L LE  Can feel deep pressure in L LE   COORDINATION: Impaired in L LE due to paresis, hypertonia, and overall decreased flexibility  EDEMA:  Not formally assessed  MUSCLE TONE: LLE: Mild, Moderate, and Hypertonic  MUSCLE LENGTH: Not formally assessed; however, demonstrates L hamstring muscle tightness  DTRs:  Not formally assessed  POSTURE: rounded shoulders, forward head, and weight shift right  LOWER EXTREMITY ROM:       Right Eval Active ROM Left Eval Passive ROM  Hip flexion WFL Achieves at least 90  Hip extension    Hip abduction    Hip adduction    Hip internal rotation  Unable to achieve neutral hip rotation, lacking internal rotation  Hip external rotation  [facility] and rests in excessive ER  Knee flexion [facility]  Knee extension WFL Able to achieve terminal knee extension in supine, but with significant discomfort due to hamstring tone  Ankle dorsiflexion WFL Did not remove AFO to assess this date  Ankle plantarflexion WFL Did not remove AFO to assess this date  Ankle inversion    Ankle eversion     (Blank rows = not tested)  LOWER EXTREMITY MMT:    MMT Right Eval Left Eval  Hip flexion 4+ 2-  (compensates with hip adductor activation)  Hip extension    Hip abduction    Hip adduction  3+  Hip internal rotation  2-  Hip external rotation  2  Knee flexion 4+ 1  Knee extension 4+ 3+  Ankle dorsiflexion 4+ Did not remove AFO to assess this date  Ankle plantarflexion 4+ Did not remove AFO to assess this date  Ankle inversion    Ankle eversion    (Blank rows = not tested)  Manual Muscle Test Scale 0/5 = No muscle contraction can be seen or felt 1/5 = Contraction can be felt, but there is no motion 2-/5 = Part moves through incomplete ROM w/ gravity decreased 2/5 = Part moves through complete ROM w/ gravity decreased 2+/5 = Part moves through  incomplete ROM (<50%) against gravity or through complete ROM w/ gravity 3-/5 = Part moves through incomplete ROM (>50%) against gravity 3/5 = Part moves through complete ROM against gravity 3+/5 = Part moves through complete ROM against gravity/slight resistance 4-/5= Holds test position against slight to moderate pressure 4/5 = Part moves through complete ROM against gravity/moderate resistance 4+/5= Holds test position against moderate to strong pressure 5/5 = Part moves through complete ROM against gravity/full resistance  BED MOBILITY: (sleeps in regular bed, but has hospital bed available) Findings: Sit to supine CGA Supine to sit Min A and Mod A Rolling to Right Mod A Rolling to Left Min A and Mod A  TRANSFERS: Sit to stand: CGA and Min A  Assistive device utilized: Counselling psychologist     Stand to sit: CGA and Min A  Assistive device utilized: Tree surgeon to chair: Min A  Assistive device utilized: Counselling psychologist  RAMP:  Not tested  CURB:  Not tested  STAIRS: Not tested *need to assess*  GAIT: Findings: advances L LE with excessive hip external rotation using hip adductors, step to pattern, decreased arm swing- Left, decreased step length- Right, decreased stance time- Left, decreased stride length, decreased hip/knee flexion- Left, decreased ankle dorsiflexion- Left, lateral lean- Right, and decreased trunk rotation Distance walked: ~14ft Assistive device utilized: small based quad cane in R UE  Level of assistance: CGA and Min A  FUNCTIONAL TESTS:  5 times sit to stand: need to assess Timed up and go (TUG): need to assess 6 minute walk test: need to assess 10 meter walk test: 0.34m/s using small based quad cane and CGA Berg Balance Scale: need to assess  PATIENT SURVEYS:  Stroke Impact Scale 03/15/2024: 49/80                                                                                                                                 TREATMENT DATE: 03/24/2024  Pt arrives to clinic in transport chair with his personal SBQC (small based quad cane) and requests to walk from the waiting room back to clinic space.  Gait training ~135ft into therapy clinic using SBQC with close SBA/CGA for safety - performs step-to pattern leading with L LE, continues to have excessive L LE hip external rotation, lacking L hip/knee flexion during swing phase, as well as lack of toe-off during terminal stance because of the excessive hip external rotation. Pt also continues to demo significantly decreased gait speed .   Due to pt reporting continued soreness after his fall, deferred intensive gait training this session.   Doffed L LE AFO and noticed it is too tight of a fit around his calf - educated pt on need to follow-up with [facility] regarding this as pt also reporting pain and having visible indention from the brace. Pt confirms he often removes the AFO in the evening due to discomfort. Per pt request, therapist called [facility] to notify them of his need to be scheduled for follow-up.  Stand pivot chair>EOM with pt self-selecting turning 180degrees rather than side stepping towards the L and turning 90 degrees to sit on the mat.   Throughout session, pt also with difficulty visually locating items on his far L side and feel pt may have peripheral visual field deficits.   Performed supine interventions for L LE NMR and tone management including (doffed AFO for mat level interventions): Supine hip/knee flexor stretch via supine lying with LE in full extension Pt reports he has been doing quad set exercise and it is noticeable because pt is significantly more comfortable in this position today compared to at initial eval with improved L hip alignment (not as excessively externally rotated) L hip stretch into internal rotation while at 90/90 positioning Pt reporting some hip adductor pain if therapist moves out of the 90/90 position too  quickly Supine hooklying hip  adductor pillow squeezes with focus on L LE adducting and achieving midline with knees (rather than allowing his knees to fall to the L) x10 reps with 3 sec hold Supine bridges with pt requiring max A to keep L LE in proper hooklying position 2x 6 reps with pt demonstrating improved hip clearance on 2nd set Noticed impaired motor planning when trying to recruit L LE to lift hips as pt tendency to lift L LE up in flexed position and only rely on pushing down through R LE  Gait training ~186ft back to waiting area using [facility] with skilled min A and goal of achieving reciprocal stepping pattern with cue to increase R LE step length and therapist manually facilitating increased L wt shift onto L stance limb - this results in naturally increased gait speed, slight increased balance instability with this, but otherwise pt does well achieving these gait mechanics. Pt also noticed to have improvement of decreased hip external rotation after mat level NMR.    PATIENT EDUCATION: Education details: Therapy POC, LTGs, HEP, importance of improving L LE flexibility, findings during assessment  Person educated: Patient Education method: Explanation and Handouts Education comprehension: verbalized understanding and needs further education  HOME EXERCISE PROGRAM:  Access Code: [email or web address] URL: [email or web address] Date: 03/10/2024 Prepared by: Carlen Chasten  Exercises - Seated Hip Adduction Isometrics with Ball  - 1 x daily - 7 x weekly - 2 sets - 10 reps - 5 seconds hold - Supine Quadricep Sets  - 1 x daily - 7 x weekly - 2 sets - 10 reps - 3 seconds hold   GOALS: Goals reviewed with patient? Yes  SHORT TERM GOALS: Target date: 04/21/2024  Patient will be independent in home exercise program to improve strength/mobility for better functional independence with ADLs.  Baseline: initiated on 03/10/2024 Goal status: INITIAL   LONG TERM GOALS: Target date:  06/02/2024   Patient (< 32 years old) will complete five times sit to stand (5XSTS) test in < 10 seconds indicating an increased LE strength and improved balance.  Baseline: 03/15/2024: 16.53 seconds from standard green chair relying on R hand to push-up from armrest and pt's personal SBQC nearby, but not using it VERSUS 25.85 seconds with R hand across chest, with min A for balance/safety without use of UE support Goal status: INITIAL  2.  Patient will increase Berg Balance score to > 45/56 to demonstrate improved balance and decreased fall risk during functional activities and ADLs.  Baseline: 03/15/2024: 14/56 Goal status: INITIAL  3.  Patient will increase 10 meter walk test to >1.21m/s using LRAD as to improve gait speed for better community ambulation and to reduce fall risk.  Baseline: 0.30 m/s using small based quad cane with CGA and 1x light min A due to balance instability  Goal status: INITIAL  4.  Patient will increase six minute walk test distance to >551ft for progression towards community ambulator and improve gait ability  Baseline: 26ft with SBQC  Goal status: INITIAL  5.  Patient will reduce timed up and go to <15 seconds to reduce fall risk and demonstrate improved transfer/gait ability. Baseline: 03/15/2024: 25.32 seconds using SBQC and chair with armrest as well as CGA for safety/steadying Goal status: INITIAL  6.  Patient will ascend/descend 4 stairs, using railing, independently without loss of balance to improve ability to get in/out of home.   Baseline: need to assess Goal status: INITIAL  ASSESSMENT:  CLINICAL IMPRESSION:   Patient is a 48 y.o. male who  was seen today for physical therapy treatment for R hemiplegia (UE>LE) with increased tone, impaired balance, impaired functional mobility, impaired gait, and increased fall risk following CVA in 2020. Mr. Likes is highly motivated to participate in therapy and improve his ability to walk. As noted above, pt  demonstrates significant impairments in R LE strength and ROM as well as increased tone and significant sensory deficits impacting his functional mobility. Patient has been diligent in performing HEP addressing his excessive L hip external rotation positioning and there has been a noticeable improvement in his LE alignment. Patient reports having a fall last Friday, from which he is still sore; therefore, deferred intensive gait training today. Therapy session focused on L LE NMR with pt responding well to the interventions and resulting improved alignment in gait training at end of session. Therapist recommends pt follow-up with [facility] regarding poor L LE AFO fit causing pain around his calf. Mr. Birky will benefit from further skilled PT to improve these deficits in order to increase QOL and ease/safety with ADLs.    OBJECTIVE IMPAIRMENTS: Abnormal gait, cardiopulmonary status limiting activity, decreased activity tolerance, decreased balance, decreased coordination, decreased endurance, decreased knowledge of use of DME, decreased mobility, difficulty walking, decreased ROM, decreased strength, hypomobility, impaired flexibility, impaired sensation, impaired tone, impaired UE functional use, improper body mechanics, postural dysfunction, and pain.   ACTIVITY LIMITATIONS: carrying, lifting, bending, standing, squatting, sleeping, stairs, transfers, bed mobility, continence, bathing, toileting, dressing, reach over head, hygiene/grooming, locomotion level, and caring for others  PARTICIPATION LIMITATIONS: meal prep, cleaning, laundry, medication management, shopping, community activity, and yard work  PERSONAL FACTORS: Age, Past/current experiences, Time since onset of injury/illness/exacerbation, Transportation, and 3+ comorbidities: HTN, hx of kidney stones, depression  are also affecting patient's functional outcome.   REHAB POTENTIAL: Good  CLINICAL DECISION MAKING: Evolving/moderate  complexity  EVALUATION COMPLEXITY: Moderate  PLAN:  PT FREQUENCY: 1-2x/week  PT DURATION: 12 weeks  PLANNED INTERVENTIONS: [id number]- PT Re-evaluation, 97750- Physical Performance Testing, 97110-Therapeutic exercises, 97530- Therapeutic activity, [id number]- Neuromuscular re-education, 97535- Self Care, 16109- Manual therapy, [id number]- Gait training, [phone]- Orthotic/Prosthetic subsequent, [phone]- Canalith repositioning, [phone]- Electrical stimulation (manual), Patient/Family education, Balance training, Stair training, Taping, Dry Needling, Joint mobilization, Joint manipulation, Vestibular training, Visual/preceptual remediation/compensation, DME instructions, Cryotherapy, Moist heat, and Biofeedback  PLAN FOR NEXT SESSION:  - follow-up on recommendation to re-consult [facility] regarding poor AFO fit  - follow-up on L visual field deficits  - follow-up on wheelchair picture - high intensity gait training using +2 HHA vs litegait support vs [place] crutch - L LE stretching and weightbearing for tone management and to improve alignment during gait/standing  - bridges with R foot placed on unstable surface to promote increased L LE wt bearing - stairs - progress HEP   Terrye Dombrosky, PT, DPT, NCS, CSRS Physical Therapist - [place]  4:06 PM 03/24/24

## 2024-03-29 ENCOUNTER — Ambulatory Visit: Admitting: Physical Therapy

## 2024-03-29 DIAGNOSIS — I69354 Hemiplegia and hemiparesis following cerebral infarction affecting left non-dominant side: Secondary | ICD-10-CM

## 2024-03-29 DIAGNOSIS — R2681 Unsteadiness on feet: Secondary | ICD-10-CM

## 2024-03-29 DIAGNOSIS — R29818 Other symptoms and signs involving the nervous system: Secondary | ICD-10-CM

## 2024-03-29 DIAGNOSIS — M6281 Muscle weakness (generalized): Secondary | ICD-10-CM | POA: Diagnosis not present

## 2024-03-29 DIAGNOSIS — R2689 Other abnormalities of gait and mobility: Secondary | ICD-10-CM

## 2024-03-29 DIAGNOSIS — R278 Other lack of coordination: Secondary | ICD-10-CM | POA: Diagnosis not present

## 2024-03-29 NOTE — Therapy (Signed)
 OUTPATIENT PHYSICAL THERAPY NEURO TREATMENT   Patient Name: Matthew Black MRN: [id number] DOB:02-19-1976, 48 y.o., male Today's Date: 03/29/2024   PCP: Sylvia Everts, PA-C REFERRING PROVIDER: Liam Redhead, MD  END OF SESSION:   PT End of Session - 03/29/24 1534     Visit Number 5    Number of Visits 24    Date for PT Re-Evaluation 06/02/24    Authorization Type [employer] MEDICARE    Authorization Time Period 03/10/24-06/02/24    PT Start Time 1534    PT Stop Time 1615    PT Time Calculation (min) 41 min    Equipment Utilized During Treatment Gait belt;Other (comment)   L LE AFO   Activity Tolerance Patient tolerated treatment well    Behavior During Therapy [facility] for tasks assessed/performed              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   Stroke (HCC)    RICA, R A1, R MCA occlusion 07/19/19   Past Surgical History:  Procedure Laterality Date   CRANIOPLASTY Right 06/05/2020   Procedure: CRANIOPLASTY;  Surgeon: Augusto Blonder, MD;  Location: [facility] OR;  Service: Neurosurgery;  Laterality: Right;  right   CRANIOTOMY Right 07/19/2019   Procedure: RIGHT HEMI-CRANIECTOMY With implantation of skull flap to abdominal wall;  Surgeon: Augusto Blonder, MD;  Location: [facility] OR;  Service: Neurosurgery;  Laterality: Right;   CYST EXCISION N/A 10/08/2016   Procedure: EXCISION OF POSTERIOR NECK CYST;  Surgeon: Adalberto Acton, MD;  Location: [facility] ORS;  Service: General;  Laterality: N/A;   CYSTOSCOPY/URETEROSCOPY/HOLMIUM LASER/STENT PLACEMENT Right 03/16/2020    Procedure: CYSTOSCOPY RIGHT RETROGRADE PYELOGRAM URETEROSCOPY/HOLMIUM LASER/STENT PLACEMENT;  Surgeon: Samson Croak, MD;  Location: [facility];  Service: Urology;  Laterality: Right;   INCISION AND DRAINAGE ABSCESS N/A 09/22/2014   Procedure: INCISION AND DRAINAGE ABSCESS POSTERIOR NECK;  Surgeon: Azucena Bollard, MD;  Location: [facility]S;  Service: General;  Laterality: N/A;   INCISION AND DRAINAGE ABSCESS N/A 12/20/2015   Procedure: INCISION AND DRAINAGE POSTERIOR NECK MASS;  Surgeon: Oralee Billow, MD;  Location: [facility];  Service: General;  Laterality: N/A;   INCISION AND DRAINAGE ABSCESS Left 07/10/2004   middle finger   IR IVC FILTER PLMT / S&I /IMG GUID/MOD SED  07/21/2019   IR RADIOLOGIST EVAL & MGMT  12/14/2019   IR RADIOLOGIST EVAL & MGMT  12/21/2020   IR VENOGRAM RENAL UNI RIGHT  07/21/2019   MASS EXCISION N/A 07/21/2017   Procedure: EXCISION OF BENIGN NECK LESION WITH LAYERED CLOSURE;  Surgeon: Alger Infield, MD;  Location: [facility];  Service: Plastics;  Laterality: N/A;   MASS EXCISION N/A 11/10/2017   Procedure: EXCISION BENIGN LESION OF THE NECK WITH LAYERED CLOSURE;  Surgeon: Alger Infield, MD;  Location: [facility];  Service: Plastics;  Laterality: N/A;   Patient Active Problem List   Diagnosis Date Noted   Hx of adenomatous colonic polyps 06/01/2023   COVID-19 virus infection 04/21/2021   Left-sided  weakness 04/21/2021   S/P craniotomy 06/05/2020   History of cranioplasty 06/05/2020   Peri-rectal abscess 02/14/2020   Abnormal CT scan, pelvis 02/14/2020   Pancytopenia (HCC) 12/02/2019   Nephrolithiasis 12/02/2019   Hydronephrosis with renal and ureteral calculus obstruction 12/02/2019   Rectal pain 12/02/2019   Hyperkalemia 12/02/2019   Reactive depression    Wound infection after surgery    Sleep disturbance    Dysphagia, post-stroke    Transaminitis    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   Cerebral abscess     Urinary tract infection without hematuria    Altered mental status    Primary hypercoagulable state (HCC)    Acute pulmonary embolism without acute cor pulmonale (HCC)    Deep vein thrombosis (DVT) of non-extremity vein    Hypokalemia    Acute blood loss anemia    Leukocytosis    Endotracheal tube present    Acute respiratory failure with hypoxemia (HCC)    Stroke (cerebrum) (HCC) 07/19/2019   Pressure injury of skin 07/19/2019   Acute CVA (cerebrovascular accident) (HCC)    Encephalopathy    Dysphagia    Acute encephalopathy    Essential hypertension    Obesity 03/12/2016   Scalp abscess 12/20/2015   Neck abscess 12/20/2015   Pilonidal cyst 02/08/2013    ONSET DATE: stroke in August 2020  REFERRING DIAG: I63.9 (ICD-10-CM) - Cerebrovascular accident (CVA), unspecified mechanism (HCC)   THERAPY DIAG:   Muscle weakness (generalized)  Other lack of coordination  Other abnormalities of gait and mobility  Unsteadiness on feet  Hemiplegia and hemiparesis following cerebral infarction affecting left non-dominant side (HCC)  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)  Other symptoms and signs involving the nervous system  Rationale for Evaluation and Treatment: Rehabilitation  SUBJECTIVE:                                                                                                                                                                                             SUBJECTIVE STATEMENT:  Pt reports he is doing "alright." Reports he is "still sore" from the fall prior to last session, but "it is working itself out." Pt reports he feels like he hurt his R shoulder when he fell. Pt is able to demonstrate AROM through [facility] shoulder flexion and abduction. Pt states he went to aquatic center yesterday with his brother and it felt really good because the water  is warm.  Pt states he has an appointment this Thursday, 5/8, with  [facility] in [place].   Pt states he took a picture of  his wheelchair, but his phone is dead currently.    Initial Eval: Patient reports his goal is to walk without using a cane and without assistance. Patient states he wants to walk like he did before having the stroke. Reports his stroke was in August of 2020. States he currently uses a quad cane for ambulation and tries to walk as much as possible. Reports he has a manual wheelchair, but states he tries not to use it because it depresses him and makes him feel like he is back to when he first had his stroke. Reports wearing L LE custom AFO at all times.   Reports he is currently doing everything mod-I, but states he could call his mom, dad, or brothers if he needed assistance.  Reports he has routine botox  injections in L UE to help manage tone, but doesn't receive any in his LE.  Reports he has a hospital bed, but sleeps in a regular bed.  Pt accompanied by: self  PERTINENT HISTORY: L hemiparesis s/p R ACA and MCA territory CVA in August 2020, HTN, hx of kidney stones, depression  Per Neurology MD note on 11/24/2023: 64-year-old African-American male with large right hemispheric infarct due to right internal carotid and middle cerebral artery occlusion with cytotoxic edema and brain herniation s/p hemicraniectomy in 06/2019 is doing reasonably well with residual left spastic hemiparesis and left hemianopia.  Etiology of carotid occlusion unclear possibly dissection.  He had a prolonged hospital admission with several complications including DVT, pulmonary embolism, hemorrhagic transformation, abdominal wall hematoma, UTI but made quite remarkable recovery   PAIN:  Are you having pain? No, denies pain today, but states sometimes will have L shoulder pain  PRECAUTIONS: Fall  RED FLAGS: None   WEIGHT BEARING RESTRICTIONS: No  FALLS: Has patient fallen in last 6 months? 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  DIAGNOSTIC FINDINGS:  EXAM: CT ANGIOGRAPHY HEAD AND NECK WITH AND WITHOUT CONTRAST IMPRESSION: 1. No acute intracranial abnormality. 2. Chronically occluded right ICA with minimal opacification of the right MCA and ACA branches. 3. Redemonstrated extensive encephalomalacia in the right MCA and ACA territories secondary to a prior infarct.  Electronically Signed   By: Clora Dane M.D.   On: 11/27/2023 13:45  COGNITION: Overall cognitive status: Within functional limits for tasks assessed  VISION (03/29/2024): Therapist performed visual field screen with pt appearing to have L homonomous hemianopsia - pt confirms he has loss of vision on L side.   SENSATION: Light touch: Impaired in L LE Proprioception: Impaired  in L LE  Can feel deep pressure in L LE   COORDINATION: Impaired in L LE due to paresis, hypertonia, and overall decreased flexibility  EDEMA:  Not formally assessed  MUSCLE TONE: LLE: Mild, Moderate, and Hypertonic  MUSCLE LENGTH: Not formally assessed; however, demonstrates L hamstring muscle  tightness  DTRs:  Not formally assessed  POSTURE: rounded shoulders, forward head, and weight shift right  LOWER EXTREMITY ROM:       Right Eval Active ROM Left Eval Passive ROM  Hip flexion WFL Achieves at least 90  Hip extension    Hip abduction    Hip adduction    Hip internal rotation  Unable to achieve neutral hip rotation, lacking internal rotation  Hip external rotation  [facility] and rests in excessive ER  Knee flexion [facility]  Knee extension WFL Able to achieve terminal knee extension in supine, but with significant discomfort due to hamstring tone  Ankle dorsiflexion WFL Did not remove AFO to assess this date  Ankle plantarflexion WFL Did not remove AFO to assess this date  Ankle inversion    Ankle eversion     (Blank rows = not tested)  LOWER EXTREMITY MMT:    MMT Right Eval Left Eval  Hip flexion 4+ 2-  (compensates with hip adductor activation)  Hip extension    Hip abduction    Hip adduction  3+  Hip internal rotation  2-  Hip external rotation  2  Knee flexion 4+ 1  Knee extension 4+ 3+  Ankle dorsiflexion 4+ Did not remove AFO to assess this date  Ankle plantarflexion 4+ Did not remove AFO to assess this date  Ankle inversion    Ankle eversion    (Blank rows = not tested)  Manual Muscle Test Scale 0/5 = No muscle contraction can be seen or felt 1/5 = Contraction can be felt, but there is no motion 2-/5 = Part moves through incomplete ROM w/ gravity decreased 2/5 = Part moves through complete ROM w/ gravity decreased 2+/5 = Part moves through incomplete ROM (<50%) against gravity or through complete ROM w/ gravity 3-/5 = Part moves through incomplete ROM (>50%) against gravity 3/5 = Part moves through complete ROM against gravity 3+/5 = Part moves through complete ROM against gravity/slight resistance 4-/5= Holds test position against slight to moderate pressure 4/5 = Part moves through complete ROM against gravity/moderate resistance 4+/5= Holds  test position against moderate to strong pressure 5/5 = Part moves through complete ROM against gravity/full resistance  BED MOBILITY: (sleeps in regular bed, but has hospital bed available) Findings: Sit to supine CGA Supine to sit Min A and Mod A Rolling to Right Mod A Rolling to Left Min A and Mod A  TRANSFERS: Sit to stand: CGA and Min A  Assistive device utilized: Counselling psychologist     Stand to sit: CGA and Min A  Assistive device utilized: Tree surgeon to chair: Min A  Assistive device utilized: Counselling psychologist       RAMP:  Not tested  CURB:  Not tested  STAIRS: Not tested *need to assess*  GAIT: Findings: advances L LE with excessive hip external rotation using hip adductors, step to pattern, decreased arm swing- Left, decreased step length- Right, decreased stance time- Left, decreased stride length, decreased hip/knee flexion- Left, decreased ankle dorsiflexion- Left, lateral lean- Right, and decreased trunk rotation Distance walked: ~69ft Assistive device utilized: small based quad cane in R UE  Level of assistance: CGA and Min A  FUNCTIONAL TESTS:  5 times sit to stand: need to assess Timed up and  go (TUG): need to assess 6 minute walk test: need to assess 10 meter walk test: 0.66m/s using small based quad cane and CGA Berg Balance Scale: need to assess  PATIENT SURVEYS:  Stroke Impact Scale 03/15/2024: 49/80                                                                                                                                TREATMENT DATE: 03/29/2024  Therapist performed visual field screen with pt appearing to have L homonomous hemianopsia - pt confirms he has loss of vision on L side.   Pt arrives to clinic in transport chair with his personal SBQC (small based quad cane) and requests to walk from the waiting room back to clinic space.  Gait training ~166ft into therapy clinic using SBQC with SBA for safety - performs  step-to pattern leading with L LE, continues to have excessive L LE hip external rotation, lacking L hip/knee flexion during swing phase, as well as lack of toe-off during terminal stance because of the excessive hip external rotation. Pt also continues to demo significantly decreased gait speed .   Pt agreeable to participate in higher intensity gait training today and will notify thearpist if he is too sore.   Gait training 64ft + 175ft x2 (seated breaks between) using +2 R HHA with therapist providing min assist for L LE management and facilitating weight shifting. Pt demonstrating the following gait deviations with therapist providing the described cuing and facilitation for improvement:  On initial gait trial, pt demos heavy R lateral trunk lean relying on heavy mod/max A support through +2 HHA  Provided mirror feedback on 2nd gait trial with significant improvement in upright posture and then weaned pt back off of mirror feedback on 2nd portion of 3rd walk with pt able to sustain improved upright posture due to increasing confidence Cuing for longer L stance phase time to allow larger R step length to achieve consistent reciprocal stepping pattern - pt achieves reciprocal pattern >90% of the time Pt does have full L knee extension during stance with potential slight hyperextension Pt able to advance L LE during swing without assist with mild excessive hip external rotation (ER), but less so when only performing step-to pattern *of note pt does have difficulty turning and stepping back to chair when using +2 assist requiring heavier min A for steadying balance and increased cuing for safety with sequencing  Therapist transported pt up to entrance of hospital in transport chair.   PATIENT EDUCATION: Education details: Therapy POC, LTGs, HEP, importance of improving L LE flexibility, findings during assessment  Person educated: Patient Education method: Explanation and Handouts Education  comprehension: verbalized understanding and needs further education  HOME EXERCISE PROGRAM:  Access Code: [email or web address] URL: [email or web address] Date: 03/10/2024 Prepared by: Carlen Chasten  Exercises - Seated Hip Adduction Isometrics with Ball  - 1 x daily - 7 x weekly - 2 sets - 10 reps -  5 seconds hold - Supine Quadricep Sets  - 1 x daily - 7 x weekly - 2 sets - 10 reps - 3 seconds hold   GOALS: Goals reviewed with patient? Yes  SHORT TERM GOALS: Target date: 04/21/2024  Patient will be independent in home exercise program to improve strength/mobility for better functional independence with ADLs.  Baseline: initiated on 03/10/2024 Goal status: INITIAL   LONG TERM GOALS: Target date: 06/02/2024   Patient (< 86 years old) will complete five times sit to stand (5XSTS) test in < 10 seconds indicating an increased LE strength and improved balance.  Baseline: 03/15/2024: 16.53 seconds from standard green chair relying on R hand to push-up from armrest and pt's personal SBQC nearby, but not using it VERSUS 25.85 seconds with R hand across chest, with min A for balance/safety without use of UE support Goal status: INITIAL  2.  Patient will increase Berg Balance score to > 45/56 to demonstrate improved balance and decreased fall risk during functional activities and ADLs.  Baseline: 03/15/2024: 14/56 Goal status: INITIAL  3.  Patient will increase 10 meter walk test to >1.14m/s using LRAD as to improve gait speed for better community ambulation and to reduce fall risk.  Baseline: 0.30 m/s using small based quad cane with CGA and 1x light min A due to balance instability  Goal status: INITIAL  4.  Patient will increase six minute walk test distance to >565ft for progression towards community ambulator and improve gait ability  Baseline: 277ft with SBQC  Goal status: INITIAL  5.  Patient will reduce timed up and go to <15 seconds to reduce fall risk and demonstrate improved  transfer/gait ability. Baseline: 03/15/2024: 25.32 seconds using SBQC and chair with armrest as well as CGA for safety/steadying Goal status: INITIAL  6.  Patient will ascend/descend 4 stairs, using railing, independently without loss of balance to improve ability to get in/out of home.   Baseline: need to assess Goal status: INITIAL  ASSESSMENT:  CLINICAL IMPRESSION:   Patient is a 48 y.o. male who was seen today for physical therapy treatment for R hemiplegia (UE>LE) with increased tone, impaired balance, impaired functional mobility, impaired gait, and increased fall risk following CVA in 2020. Mr. Ladewig is highly motivated to participate in therapy and improve his ability to walk. As noted above, pt demonstrates significant impairments in R LE strength and ROM as well as increased tone and significant sensory deficits impacting his functional mobility. Patient has follow-up appointment with [facility] on Thursday 5/8 to address improper fit of AFO. Therapy session today focused on progression of higher intensity gait training using +2 for R HHA to decrease pt's reliance on R UE and to promote reciprocal stepping pattern with increased gait speed. Pt initially fearful of gait without use of SBQC, but responded well to mirror feedback to improve upright trunk posture and increase his confidence. Mr. Shalaby will benefit from further skilled PT to improve these deficits in order to increase QOL and ease/safety with ADLs.    OBJECTIVE IMPAIRMENTS: Abnormal gait, cardiopulmonary status limiting activity, decreased activity tolerance, decreased balance, decreased coordination, decreased endurance, decreased knowledge of use of DME, decreased mobility, difficulty walking, decreased ROM, decreased strength, hypomobility, impaired flexibility, impaired sensation, impaired tone, impaired UE functional use, improper body mechanics, postural dysfunction, and pain.   ACTIVITY LIMITATIONS: carrying, lifting,  bending, standing, squatting, sleeping, stairs, transfers, bed mobility, continence, bathing, toileting, dressing, reach over head, hygiene/grooming, locomotion level, and caring for others  PARTICIPATION  LIMITATIONS: meal prep, cleaning, laundry, medication management, shopping, community activity, and yard work  PERSONAL FACTORS: Age, Past/current experiences, Time since onset of injury/illness/exacerbation, Transportation, and 3+ comorbidities: HTN, hx of kidney stones, depression  are also affecting patient's functional outcome.   REHAB POTENTIAL: Good  CLINICAL DECISION MAKING: Evolving/moderate complexity  EVALUATION COMPLEXITY: Moderate  PLAN:  PT FREQUENCY: 1-2x/week  PT DURATION: 12 weeks  PLANNED INTERVENTIONS: 97164- PT Re-evaluation, 97750- Physical Performance Testing, 97110-Therapeutic exercises, 97530- Therapeutic activity, [id number]- Neuromuscular re-education, 97535- Self Care, [id number]- Manual therapy, [id number]- Gait training, [phone]- Orthotic/Prosthetic subsequent, [phone]- Canalith repositioning, [phone]- Electrical stimulation (manual), Patient/Family education, Balance training, Stair training, Taping, Dry Needling, Joint mobilization, Joint manipulation, Vestibular training, Visual/preceptual remediation/compensation, DME instructions, Cryotherapy, Moist heat, and Biofeedback  PLAN FOR NEXT SESSION:  - follow-up on apt with [facility] on Thur 5/8 regarding poor AFO fit  - follow-up on wheelchair picture - high intensity gait training using +2 HHA vs litegait support vs [place] crutch - L LE stretching and weightbearing for tone management and to improve alignment during gait/standing  - bridges with R foot placed on unstable surface to promote increased L LE wt bearing - stairs - progress HEP   Dagan Heinz, PT, DPT, NCS, CSRS Physical Therapist - [place]  [facility]  4:24 PM 03/29/24

## 2024-03-31 ENCOUNTER — Ambulatory Visit: Admitting: Physical Therapy

## 2024-03-31 DIAGNOSIS — R29818 Other symptoms and signs involving the nervous system: Secondary | ICD-10-CM | POA: Diagnosis not present

## 2024-03-31 DIAGNOSIS — R2689 Other abnormalities of gait and mobility: Secondary | ICD-10-CM | POA: Diagnosis not present

## 2024-03-31 DIAGNOSIS — R2681 Unsteadiness on feet: Secondary | ICD-10-CM

## 2024-03-31 DIAGNOSIS — I69354 Hemiplegia and hemiparesis following cerebral infarction affecting left non-dominant side: Secondary | ICD-10-CM

## 2024-03-31 DIAGNOSIS — M6281 Muscle weakness (generalized): Secondary | ICD-10-CM | POA: Diagnosis not present

## 2024-03-31 DIAGNOSIS — R278 Other lack of coordination: Secondary | ICD-10-CM

## 2024-03-31 NOTE — Therapy (Signed)
 OUTPATIENT PHYSICAL THERAPY NEURO TREATMENT   Patient Name: Matthew Black MRN: [id number] DOB:11/28/1975, 48 y.o., male Today's Date: 03/31/2024   PCP: Sylvia Everts, PA-C REFERRING PROVIDER: Liam Redhead, MD  END OF SESSION:   PT End of Session - 03/31/24 1313     Visit Number 6    Number of Visits 24    Date for PT Re-Evaluation 06/02/24    Authorization Type [employer] MEDICARE    Authorization Time Period 03/10/24-06/02/24    PT Start Time 1315    PT Stop Time 1358    PT Time Calculation (min) 43 min    Equipment Utilized During Treatment Gait belt;Other (comment)   L LE AFO   Activity Tolerance Patient tolerated treatment well    Behavior During Therapy [facility] for tasks assessed/performed               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   Stroke (HCC)    RICA, R A1, R MCA occlusion 07/19/19   Past Surgical History:  Procedure Laterality Date   CRANIOPLASTY Right 06/05/2020   Procedure: CRANIOPLASTY;  Surgeon: Augusto Blonder, MD;  Location: [facility] OR;  Service: Neurosurgery;  Laterality: Right;  right   CRANIOTOMY Right 07/19/2019   Procedure: RIGHT HEMI-CRANIECTOMY With implantation of skull flap to abdominal wall;  Surgeon: Augusto Blonder, MD;  Location: [facility] OR;  Service: Neurosurgery;  Laterality: Right;   CYST EXCISION N/A 10/08/2016   Procedure: EXCISION OF POSTERIOR NECK CYST;  Surgeon: Adalberto Acton, MD;  Location: [facility] ORS;  Service: General;  Laterality: N/A;   CYSTOSCOPY/URETEROSCOPY/HOLMIUM LASER/STENT PLACEMENT Right 03/16/2020    Procedure: CYSTOSCOPY RIGHT RETROGRADE PYELOGRAM URETEROSCOPY/HOLMIUM LASER/STENT PLACEMENT;  Surgeon: Samson Croak, MD;  Location: [facility] ORS;  Service: Urology;  Laterality: Right;   INCISION AND DRAINAGE ABSCESS N/A 09/22/2014   Procedure: INCISION AND DRAINAGE ABSCESS POSTERIOR NECK;  Surgeon: Azucena Bollard, MD;  Location: [facility] ORS;  Service: General;  Laterality: N/A;   INCISION AND DRAINAGE ABSCESS N/A 12/20/2015   Procedure: INCISION AND DRAINAGE POSTERIOR NECK MASS;  Surgeon: Oralee Billow, MD;  Location: [facility] ORS;  Service: General;  Laterality: N/A;   INCISION AND DRAINAGE ABSCESS Left 07/10/2004   middle finger   IR IVC FILTER PLMT / S&I /IMG GUID/MOD SED  07/21/2019   IR RADIOLOGIST EVAL & MGMT  12/14/2019   IR RADIOLOGIST EVAL & MGMT  12/21/2020   IR VENOGRAM RENAL UNI RIGHT  07/21/2019   MASS EXCISION N/A 07/21/2017   Procedure: EXCISION OF BENIGN NECK LESION WITH LAYERED CLOSURE;  Surgeon: Alger Infield, MD;  Location: [facility];  Service: Plastics;  Laterality: N/A;   MASS EXCISION N/A 11/10/2017   Procedure: EXCISION BENIGN LESION OF THE NECK WITH LAYERED CLOSURE;  Surgeon: Alger Infield, MD;  Location: [facility];  Service: Plastics;  Laterality: N/A;   Patient Active Problem List   Diagnosis Date Noted   Hx of adenomatous colonic polyps 06/01/2023   COVID-19 virus infection 04/21/2021  Left-sided weakness 04/21/2021   S/P craniotomy 06/05/2020   History of cranioplasty 06/05/2020   Peri-rectal abscess 02/14/2020   Abnormal CT scan, pelvis 02/14/2020   Pancytopenia (HCC) 12/02/2019   Nephrolithiasis 12/02/2019   Hydronephrosis with renal and ureteral calculus obstruction 12/02/2019   Rectal pain 12/02/2019   Hyperkalemia 12/02/2019   Reactive depression    Wound infection after surgery    Sleep disturbance    Dysphagia, post-stroke    Transaminitis    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   Cerebral abscess     Urinary tract infection without hematuria    Altered mental status    Primary hypercoagulable state (HCC)    Acute pulmonary embolism without acute cor pulmonale (HCC)    Deep vein thrombosis (DVT) of non-extremity vein    Hypokalemia    Acute blood loss anemia    Leukocytosis    Endotracheal tube present    Acute respiratory failure with hypoxemia (HCC)    Stroke (cerebrum) (HCC) 07/19/2019   Pressure injury of skin 07/19/2019   Acute CVA (cerebrovascular accident) (HCC)    Encephalopathy    Dysphagia    Acute encephalopathy    Essential hypertension    Obesity 03/12/2016   Scalp abscess 12/20/2015   Neck abscess 12/20/2015   Pilonidal cyst 02/08/2013    ONSET DATE: stroke in August 2020  REFERRING DIAG: I63.9 (ICD-10-CM) - Cerebrovascular accident (CVA), unspecified mechanism (HCC)   THERAPY DIAG:   Muscle weakness (generalized)  Other lack of coordination  Other abnormalities of gait and mobility  Unsteadiness on feet  Hemiplegia and hemiparesis following cerebral infarction affecting left non-dominant side (HCC)  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)  Other symptoms and signs involving the nervous system  Rationale for Evaluation and Treatment: Rehabilitation  SUBJECTIVE:                                                                                                                                                                                             SUBJECTIVE STATEMENT:  Pt reports he went to his [facility] apt today. Per pt, CPO said pt's brace was "snug" so CPO adjusted current AFO fit, including the straps. CPO said pt may be eligible for a new AFO, but wanted to try the current adjustments before starting process to get a new AFO.  Pt reports his AFO feels better after the adjustments this morning.   Pt shows therapist picture of his custom K5 wheelchair with rigid, contoured back support and Roho  cushion. Pt reports he calls [employer] to come adjust his seat  cushion when he feels it is getting uncomfortable.  Pt reports the walk into therapy clinic today "wore him out."    Initial Eval: Patient reports his goal is to walk without using a cane and without assistance. Patient states he wants to walk like he did before having the stroke. Reports his stroke was in August of 2020. States he currently uses a quad cane for ambulation and tries to walk as much as possible. Reports he has a manual wheelchair, but states he tries not to use it because it depresses him and makes him feel like he is back to when he first had his stroke. Reports wearing L LE custom AFO at all times.   Reports he is currently doing everything mod-I, but states he could call his mom, dad, or brothers if he needed assistance.  Reports he has routine botox  injections in L UE to help manage tone, but doesn't receive any in his LE.  Reports he has a hospital bed, but sleeps in a regular bed.  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  Etiology of carotid occlusion unclear possibly dissection.  He had a prolonged hospital admission with several complications including DVT, pulmonary embolism, hemorrhagic transformation, abdominal wall hematoma, UTI but made quite remarkable recovery   PAIN:  Are you having pain? No, denies pain today, but states sometimes will have L shoulder pain  PRECAUTIONS: Fall  RED FLAGS: None   WEIGHT BEARING RESTRICTIONS: No  FALLS: Has patient fallen in last 6 months?  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  DIAGNOSTIC FINDINGS:  EXAM: CT ANGIOGRAPHY HEAD AND NECK WITH AND WITHOUT CONTRAST IMPRESSION: 1. No acute intracranial abnormality. 2. Chronically occluded right ICA with minimal opacification of the right MCA and ACA branches. 3. Redemonstrated extensive encephalomalacia in the right MCA and ACA territories secondary to a prior infarct.  Electronically Signed   By: Clora Dane M.D.   On: 11/27/2023 13:45  COGNITION: Overall cognitive status: Within functional limits for tasks assessed  VISION (03/29/2024): Therapist performed visual field screen with pt appearing to have L homonomous hemianopsia - pt confirms he has loss of vision on L side.   SENSATION: Light touch: Impaired in L LE Proprioception: Impaired  in L LE  Can feel deep pressure in L LE   COORDINATION: Impaired  in L LE due to paresis, hypertonia, and overall decreased flexibility  EDEMA:  Not formally assessed  MUSCLE TONE: LLE: Mild, Moderate, and Hypertonic  MUSCLE LENGTH: Not  formally assessed; however, demonstrates L hamstring muscle tightness  DTRs:  Not formally assessed  POSTURE: rounded shoulders, forward head, and weight shift right  LOWER EXTREMITY ROM:       Right Eval Active ROM Left Eval Passive ROM  Hip flexion WFL Achieves at least 90  Hip extension    Hip abduction    Hip adduction    Hip internal rotation  Unable to achieve neutral hip rotation, lacking internal rotation  Hip external rotation  [facility] and rests in excessive ER  Knee flexion [facility]  Knee extension WFL Able to achieve terminal knee extension in supine, but with significant discomfort due to hamstring tone  Ankle dorsiflexion WFL Did not remove AFO to assess this date  Ankle plantarflexion WFL Did not remove AFO to assess this date  Ankle inversion    Ankle eversion     (Blank rows = not tested)  LOWER EXTREMITY MMT:    MMT Right Eval Left Eval  Hip flexion 4+ 2-  (compensates with hip adductor activation)  Hip extension    Hip abduction    Hip adduction  3+  Hip internal rotation  2-  Hip external rotation  2  Knee flexion 4+ 1  Knee extension 4+ 3+  Ankle dorsiflexion 4+ Did not remove AFO to assess this date  Ankle plantarflexion 4+ Did not remove AFO to assess this date  Ankle inversion    Ankle eversion    (Blank rows = not tested)  Manual Muscle Test Scale 0/5 = No muscle contraction can be seen or felt 1/5 = Contraction can be felt, but there is no motion 2-/5 = Part moves through incomplete ROM w/ gravity decreased 2/5 = Part moves through complete ROM w/ gravity decreased 2+/5 = Part moves through incomplete ROM (<50%) against gravity or through complete ROM w/ gravity 3-/5 = Part moves through incomplete ROM (>50%) against gravity 3/5 = Part moves through complete ROM against gravity 3+/5 = Part moves through complete ROM against gravity/slight resistance 4-/5= Holds test position against slight to moderate pressure 4/5 = Part moves through  complete ROM against gravity/moderate resistance 4+/5= Holds test position against moderate to strong pressure 5/5 = Part moves through complete ROM against gravity/full resistance  BED MOBILITY: (sleeps in regular bed, but has hospital bed available) Findings: Sit to supine CGA Supine to sit Min A and Mod A Rolling to Right Mod A Rolling to Left Min A and Mod A  TRANSFERS: Sit to stand: CGA and Min A  Assistive device utilized: Counselling psychologist     Stand to sit: CGA and Min A  Assistive device utilized: Tree surgeon to chair: Min A  Assistive device utilized: Counselling psychologist       RAMP:  Not tested  CURB:  Not tested  STAIRS: Not tested *need to assess*  GAIT: Findings: advances L LE with excessive hip external rotation using hip adductors, step to pattern, decreased arm swing- Left, decreased step length- Right, decreased stance time- Left, decreased stride length, decreased hip/knee flexion- Left, decreased ankle dorsiflexion- Left, lateral lean- Right, and decreased trunk rotation Distance walked: ~55ft Assistive device utilized: small based quad cane in R UE  Level of assistance: CGA and Min A  FUNCTIONAL TESTS:  5 times sit to stand: need to assess Timed up and go (TUG): need to assess 6 minute walk test: need to assess 10 meter walk test: 0.69m/s using small based quad cane and CGA Berg Balance Scale: need to assess  PATIENT SURVEYS:  Stroke Impact Scale 03/15/2024: 49/80                                                                                                                               TREATMENT DATE: 03/31/2024  Pt arrives to clinic, ambulating using his personal SBQC (small based quad cane), with transport chair in tow by volunteer staff.  Therapist provides education on energy conservation to ensure he is able to safely complete all other mobility tasks for the remainder of the day.   Gait training ~117ft into therapy room  using SBQC with SBA for safety - performs step-to pattern leading with L LE, progressing towards reciprocal pattern without cuing - continues to have excessive L LE hip external rotation, lacking L hip/knee flexion during swing phase, as well as lack of toe-off during terminal stance because of the excessive hip external rotation. Pt also continues to demo decreased gait speed .   Pt agreeable to participate in higher intensity gait training today despite some fatigue from ambulating into clinic.   Therapist assessed AFO with noticeable improvement in the fit around his lower leg/calf section as well as newly applied velcro improving the usage of AFO straps around dorsum of his foot and ankle. Pt does not report discomfort when therapist doffs AFO as was noticed prior to adjustments.  Stair navigation training ascending/descending 4 steps x2 (6" height) using R UE support on each HR with skilled min assist - started with step-to pattern progressed to reciprocal stepping pattern on ascent for L LE NMR, but only performing step-to pattern on descent leading with L LE. During ascent, pt relying heavily on circumduction compensation to advance L LE onto reciprocal step.    Gait training additional ~36ft out to hallway using [facility] with cuing for reciprocal pattern.  Donned 5lb AW to L LE for increased intensity during gait training.  Gait training 146ft x2 (seated break between) using +2 R HHA with therapist providing skilled min assist for L LE management and facilitating weight shifting. Pt demonstrating the following gait deviations with therapist providing the described cuing and facilitation for improvement:  On initial gait trial, pt demos heavy R lateral trunk lean relying on heavy mod/max support through +2 HHA  Provided mirror feedback on first half of both gait trials, with improvement in upright posture, especially on 2nd gait trial as pt having improving confidence performing gait training using  R HHA again Cuing for longer L stance phase time to allow larger R step length to achieve consistent reciprocal stepping pattern - pt achieves reciprocal pattern ~75% of the time during first gait trial and <50% on 2nd due to added AW today Pt also required +2  mod A to catch 2x LOB during gait training today due to fatigue from ambulating using AW  Pt does have full L knee extension during stance with potential slight hyperextension Pt able to advance L LE during swing without assist with moderate excessive hip external rotation (ER) *of note pt does have difficulty turning and stepping back to chair when using +2 assist requiring heavier min A for steadying balance and increased cuing for safety with sequencing With fatigue during 2nd gait trial, pt performing more consistent step-to pattern with more difficulty advancing L LE during swing when taking larger R steps because of the AW    After 2nd gait trial: HR 93bpm improved to 81bpm with seated rest break   Therapist educated pt to have volunteer services transport him up to entrance of hospital in transport chair for energy conservation.     PATIENT EDUCATION: Education details: Therapy POC, LTGs, HEP, importance of improving L LE flexibility, findings during assessment  Person educated: Patient Education method: Explanation and Handouts Education comprehension: verbalized understanding and needs further education  HOME EXERCISE PROGRAM:  Access Code: [email or web address] URL: [email or web address] Date: 03/10/2024 Prepared by: Carlen Chasten  Exercises - Seated Hip Adduction Isometrics with Ball  - 1 x daily - 7 x weekly - 2 sets - 10 reps - 5 seconds hold - Supine Quadricep Sets  - 1 x daily - 7 x weekly - 2 sets - 10 reps - 3 seconds hold   GOALS: Goals reviewed with patient? Yes  SHORT TERM GOALS: Target date: 04/21/2024  Patient will be independent in home exercise program to improve strength/mobility for better  functional independence with ADLs.  Baseline: initiated on 03/10/2024 Goal status: INITIAL   LONG TERM GOALS: Target date: 06/02/2024   Patient (< 24 years old) will complete five times sit to stand (5XSTS) test in < 10 seconds indicating an increased LE strength and improved balance.  Baseline: 03/15/2024: 16.53 seconds from standard green chair relying on R hand to push-up from armrest and pt's personal SBQC nearby, but not using it VERSUS 25.85 seconds with R hand across chest, with min A for balance/safety without use of UE support Goal status: INITIAL  2.  Patient will increase Berg Balance score to > 45/56 to demonstrate improved balance and decreased fall risk during functional activities and ADLs.  Baseline: 03/15/2024: 14/56 Goal status: INITIAL  3.  Patient will increase 10 meter walk test to >1.36m/s using LRAD as to improve gait speed for better community ambulation and to reduce fall risk.  Baseline: 0.30 m/s using small based quad cane with CGA and 1x light min A due to balance instability  Goal status: INITIAL  4.  Patient will increase six minute walk test distance to >560ft for progression towards community ambulator and improve gait ability  Baseline: 269ft with SBQC  Goal status: INITIAL  5.  Patient will reduce timed up and go to <15 seconds to reduce fall risk and demonstrate improved transfer/gait ability. Baseline: 03/15/2024: 25.32 seconds using SBQC and chair with armrest as well as CGA for safety/steadying Goal status: INITIAL  6.  Patient will ascend/descend 4 stairs, using railing, independently without loss of balance to improve ability to get in/out of home.   Baseline: need to assess Goal status: INITIAL  ASSESSMENT:  CLINICAL IMPRESSION:   Patient is a 48 y.o. male who was seen today for physical therapy treatment for R hemiplegia (UE>LE) with increased tone, impaired balance, impaired functional mobility, impaired gait, and increased  fall risk following  CVA in 2020. Mr. Grande is highly motivated to participate in therapy and improve his ability to walk. As noted above, pt demonstrates significant impairments in R LE strength and ROM as well as increased tone and significant sensory deficits impacting his functional mobility. Therapist noted  improvement in AFO fit following adjustments made this morning by [facility], with pt reporting improved comfort of brace. Patient continued participation in higher intensity gait training using +2 R HHA to decrease reliance on R UE support during L stance phase as well as tolerated additional challenge of AW added to L LE. Patient continues to require time to be more comfortable/confident ambulating with this set-up as he is very reliant on AD for security. Pt continues to benefit from mirror feedback to improve upright/midline posture. Mr. Cancellieri will benefit from further skilled PT to improve these deficits in order to increase QOL and ease/safety with ADLs.    OBJECTIVE IMPAIRMENTS: Abnormal gait, cardiopulmonary status limiting activity, decreased activity tolerance, decreased balance, decreased coordination, decreased endurance, decreased knowledge of use of DME, decreased mobility, difficulty walking, decreased ROM, decreased strength, hypomobility, impaired flexibility, impaired sensation, impaired tone, impaired UE functional use, improper body mechanics, postural dysfunction, and pain.   ACTIVITY LIMITATIONS: carrying, lifting, bending, standing, squatting, sleeping, stairs, transfers, bed mobility, continence, bathing, toileting, dressing, reach over head, hygiene/grooming, locomotion level, and caring for others  PARTICIPATION LIMITATIONS: meal prep, cleaning, laundry, medication management, shopping, community activity, and yard work  PERSONAL FACTORS: Age, Past/current experiences, Time since onset of injury/illness/exacerbation, Transportation, and 3+ comorbidities: HTN, hx of kidney stones,  depression are also affecting patient's functional outcome.   REHAB POTENTIAL: Good  CLINICAL DECISION MAKING: Evolving/moderate complexity  EVALUATION COMPLEXITY: Moderate  PLAN:  PT FREQUENCY: 1-2x/week  PT DURATION: 12 weeks  PLANNED INTERVENTIONS: 97164- PT Re-evaluation, 97750- Physical Performance Testing, 97110-Therapeutic exercises, 97530- Therapeutic activity, [id number]- Neuromuscular re-education, 97535- Self Care, 16109- Manual therapy, [id number]- Gait training, [phone]- Orthotic/Prosthetic subsequent, [phone]- Canalith repositioning, [phone]- Electrical stimulation (manual), Patient/Family education, Balance training, Stair training, Taping, Dry Needling, Joint mobilization, Joint manipulation, Vestibular training, Visual/preceptual remediation/compensation, DME instructions, Cryotherapy, Moist heat, and Biofeedback  PLAN FOR NEXT SESSION:  - follow-up on AFO adjustments made on 5/8 by [facility], ensure brace still fitting well following a few days of wear - high intensity gait training using +2 HHA vs litegait support vs Lofstrand crutch - L LE stretching and weightbearing for tone management and to improve alignment during gait/standing  - bridges with R foot placed on unstable surface to promote increased L LE wt bearing - stair navigation for L LE NMR - progress HEP   Marye Eagen, PT, DPT, NCS, CSRS Physical Therapist - [place]  [facility]  10:14 PM 03/31/24

## 2024-04-05 ENCOUNTER — Telehealth: Payer: Self-pay | Admitting: Adult Health

## 2024-04-05 ENCOUNTER — Ambulatory Visit: Admitting: Physical Therapy

## 2024-04-05 DIAGNOSIS — R278 Other lack of coordination: Secondary | ICD-10-CM | POA: Diagnosis not present

## 2024-04-05 DIAGNOSIS — R2689 Other abnormalities of gait and mobility: Secondary | ICD-10-CM

## 2024-04-05 DIAGNOSIS — R29818 Other symptoms and signs involving the nervous system: Secondary | ICD-10-CM

## 2024-04-05 DIAGNOSIS — M6281 Muscle weakness (generalized): Secondary | ICD-10-CM | POA: Diagnosis not present

## 2024-04-05 DIAGNOSIS — I69354 Hemiplegia and hemiparesis following cerebral infarction affecting left non-dominant side: Secondary | ICD-10-CM

## 2024-04-05 DIAGNOSIS — R2681 Unsteadiness on feet: Secondary | ICD-10-CM | POA: Diagnosis not present

## 2024-04-05 NOTE — Telephone Encounter (Signed)
 Fax sent to [facility] for the pt. Received confirmation that it went through

## 2024-04-05 NOTE — Telephone Encounter (Signed)
 Pt called in regards to needed forms signed  from [facility]. Informed Pt that he  is able to send Paperwork through MyChart. Pt state he rather drop it off . Pt would like to speak with Nurse.   Callback# [phone]

## 2024-04-05 NOTE — Telephone Encounter (Signed)
 Called the patient back. Pt states that he is receiving something indicating that the musculoskeletal portion of the dmv was not completed. I advised that we did complete that and page 7 which is neurological portion of the chart. He is asking for a copy of that page. I checked with medical records first to see if we have all the paperwork together and she had only the page 5 and 7 that we completed. The patient indicated he has the other forms. Copy was made and placed at the front for pick up.  Pt verbalized understanding and will bring his paperwork with him up here to review and make sure he has everything.

## 2024-04-05 NOTE — Therapy (Signed)
 OUTPATIENT PHYSICAL THERAPY NEURO TREATMENT   Patient Name: Matthew Black MRN: [id number] DOB:04/17/1976, 48 y.o., male Today's Date: 04/05/2024   PCP: Sylvia Everts, PA-C REFERRING PROVIDER: Liam Redhead, MD  END OF SESSION:   PT End of Session - 04/05/24 1620     Visit Number 7    Number of Visits 24    Date for PT Re-Evaluation 06/02/24    Authorization Type [employer] MEDICARE    Authorization Time Period 03/10/24-06/02/24    PT Start Time 1620    PT Stop Time 1700    PT Time Calculation (min) 40 min    Equipment Utilized During Treatment Gait belt;Other (comment)   L LE AFO   Activity Tolerance Patient tolerated treatment well    Behavior During Therapy [facility] for tasks assessed/performed                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   Stroke (HCC)    RICA, R A1, R MCA occlusion 07/19/19   Past Surgical History:  Procedure Laterality Date   CRANIOPLASTY Right 06/05/2020   Procedure: CRANIOPLASTY;  Surgeon: Augusto Blonder, MD;  Location: [facility] OR;  Service: Neurosurgery;  Laterality: Right;  right   CRANIOTOMY Right 07/19/2019   Procedure: RIGHT HEMI-CRANIECTOMY With implantation of skull flap to abdominal wall;  Surgeon: Augusto Blonder, MD;  Location: [facility] OR;  Service: Neurosurgery;  Laterality: Right;   CYST EXCISION N/A 10/08/2016   Procedure: EXCISION OF POSTERIOR NECK CYST;  Surgeon: Adalberto Acton, MD;  Location: [facility] ORS;  Service: General;  Laterality: N/A;   CYSTOSCOPY/URETEROSCOPY/HOLMIUM LASER/STENT PLACEMENT Right 03/16/2020    Procedure: CYSTOSCOPY RIGHT RETROGRADE PYELOGRAM URETEROSCOPY/HOLMIUM LASER/STENT PLACEMENT;  Surgeon: Samson Croak, MD;  Location: [facility] ORS;  Service: Urology;  Laterality: Right;   INCISION AND DRAINAGE ABSCESS N/A 09/22/2014   Procedure: INCISION AND DRAINAGE ABSCESS POSTERIOR NECK;  Surgeon: Azucena Bollard, MD;  Location: [facility] ORS;  Service: General;  Laterality: N/A;   INCISION AND DRAINAGE ABSCESS N/A 12/20/2015   Procedure: INCISION AND DRAINAGE POSTERIOR NECK MASS;  Surgeon: Oralee Billow, MD;  Location: [facility] ORS;  Service: General;  Laterality: N/A;   INCISION AND DRAINAGE ABSCESS Left 07/10/2004   middle finger   IR IVC FILTER PLMT / S&I /IMG GUID/MOD SED  07/21/2019   IR RADIOLOGIST EVAL & MGMT  12/14/2019   IR RADIOLOGIST EVAL & MGMT  12/21/2020   IR VENOGRAM RENAL UNI RIGHT  07/21/2019   MASS EXCISION N/A 07/21/2017   Procedure: EXCISION OF BENIGN NECK LESION WITH LAYERED CLOSURE;  Surgeon: Alger Infield, MD;  Location: [facility];  Service: Plastics;  Laterality: N/A;   MASS EXCISION N/A 11/10/2017   Procedure: EXCISION BENIGN LESION OF THE NECK WITH LAYERED CLOSURE;  Surgeon: Alger Infield, MD;  Location: [facility];  Service: Plastics;  Laterality: N/A;   Patient Active Problem List   Diagnosis Date Noted   Hx of adenomatous colonic polyps 06/01/2023   COVID-19 virus infection 04/21/2021  Left-sided weakness 04/21/2021   S/P craniotomy 06/05/2020   History of cranioplasty 06/05/2020   Peri-rectal abscess 02/14/2020   Abnormal CT scan, pelvis 02/14/2020   Pancytopenia (HCC) 12/02/2019   Nephrolithiasis 12/02/2019   Hydronephrosis with renal and ureteral calculus obstruction 12/02/2019   Rectal pain 12/02/2019   Hyperkalemia 12/02/2019   Reactive depression    Wound infection after surgery    Sleep disturbance    Dysphagia, post-stroke    Transaminitis    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   Cerebral abscess     Urinary tract infection without hematuria    Altered mental status    Primary hypercoagulable state (HCC)    Acute pulmonary embolism without acute cor pulmonale (HCC)    Deep vein thrombosis (DVT) of non-extremity vein    Hypokalemia    Acute blood loss anemia    Leukocytosis    Endotracheal tube present    Acute respiratory failure with hypoxemia (HCC)    Stroke (cerebrum) (HCC) 07/19/2019   Pressure injury of skin 07/19/2019   Acute CVA (cerebrovascular accident) (HCC)    Encephalopathy    Dysphagia    Acute encephalopathy    Essential hypertension    Obesity 03/12/2016   Scalp abscess 12/20/2015   Neck abscess 12/20/2015   Pilonidal cyst 02/08/2013    ONSET DATE: stroke in August 2020  REFERRING DIAG: I63.9 (ICD-10-CM) - Cerebrovascular accident (CVA), unspecified mechanism (HCC)   THERAPY DIAG:   Muscle weakness (generalized)  Other lack of coordination  Other abnormalities of gait and mobility  Unsteadiness on feet  Hemiplegia and hemiparesis following cerebral infarction affecting left non-dominant side (HCC)  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)  Other symptoms and signs involving the nervous system  Rationale for Evaluation and Treatment: Rehabilitation  SUBJECTIVE:                                                                                                                                                                                             SUBJECTIVE STATEMENT:  Pt continues to report AFO feels better following adjustments made by [employer] [facility]. Denies pain today. Denies stumbles/falls since last therapy session.  Pt reports he didn't walk into the clinic today in order to conserve his energy.     Initial Eval: Patient reports his goal is to walk without using a cane and without assistance. Patient states he wants to walk like he did before having the stroke. Reports his stroke was in August of  2020. States he currently uses a quad cane for ambulation and tries to walk as  much as possible. Reports he has a manual wheelchair, but states he tries not to use it because it depresses him and makes him feel like he is back to when he first had his stroke. Reports wearing L LE custom AFO at all times.   Reports he is currently doing everything mod-I, but states he could call his mom, dad, or brothers if he needed assistance.  Reports he has routine botox  injections in L UE to help manage tone, but doesn't receive any in his LE.  Reports he has a hospital bed, but sleeps in a regular bed.  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  Etiology of carotid occlusion unclear possibly dissection.  He had a prolonged hospital admission with several complications including DVT, pulmonary embolism, hemorrhagic transformation, abdominal wall hematoma, UTI but made quite remarkable recovery   PAIN:  Are you having pain? No, denies pain today, but states sometimes will have L shoulder pain  PRECAUTIONS: Fall  RED FLAGS: None   WEIGHT BEARING RESTRICTIONS: No  FALLS: Has patient fallen in last 6 months? Yes, stating he fell down his stairs ~5 months ago and is now scared to perform stair navigation  LIVING ENVIRONMENT: Lives with: lives alone Lives in: House/apartment Stairs: Yes: Internal: flight steps; can reach both and External: 4 steps; can reach both, but states he doesn't have to go upstairs Has following equipment at home: Quad cane small base, Hemi walker,  Wheelchair (manual), shower chair, Shower bench, and L LE custom AFO Pt has custom K5 wheelchair with rigid, contoured back support and Roho cushion. Pt reports he calls [employer] to come adjust his seat cushion when he feels it is getting uncomfortable.  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  DIAGNOSTIC FINDINGS:  EXAM: CT ANGIOGRAPHY HEAD AND NECK WITH AND WITHOUT CONTRAST IMPRESSION: 1. No acute intracranial abnormality. 2. Chronically occluded right ICA with minimal opacification of the right MCA and ACA branches. 3. Redemonstrated extensive encephalomalacia in the right MCA and ACA territories secondary to a prior infarct.  Electronically Signed   By: Clora Dane M.D.   On: 11/27/2023 13:45  COGNITION: Overall cognitive status: Within functional limits for tasks assessed  VISION (03/29/2024): Therapist performed visual field screen with pt appearing to have L homonomous hemianopsia - pt confirms he has loss of vision on L side.   SENSATION: Light touch: Impaired in L LE Proprioception: Impaired  in L LE  Can feel deep pressure in L LE   COORDINATION: Impaired in L LE due to paresis, hypertonia, and overall decreased flexibility  EDEMA:  Not formally assessed  MUSCLE TONE: LLE: Mild, Moderate, and Hypertonic  MUSCLE LENGTH: Not formally assessed; however, demonstrates L hamstring muscle tightness  DTRs:  Not formally assessed  POSTURE: rounded shoulders, forward head, and weight shift right  LOWER EXTREMITY ROM:        Right Eval Active ROM Left Eval Passive ROM  Hip flexion WFL Achieves at least 90  Hip extension    Hip abduction    Hip adduction    Hip internal rotation  Unable to achieve neutral hip rotation, lacking internal rotation  Hip external rotation  [facility] and rests in excessive ER  Knee flexion [facility]  Knee extension WFL Able to achieve terminal knee extension in supine, but with significant discomfort due to hamstring tone  Ankle dorsiflexion WFL Did not remove AFO to assess this date  Ankle plantarflexion WFL Did not remove AFO to assess this date  Ankle inversion    Ankle eversion     (Blank rows = not tested)  LOWER EXTREMITY MMT:    MMT Right Eval Left Eval  Hip flexion 4+ 2-  (compensates with hip adductor activation)  Hip extension    Hip abduction    Hip adduction  3+  Hip internal rotation  2-  Hip external rotation  2  Knee flexion 4+ 1  Knee extension 4+ 3+  Ankle dorsiflexion 4+ Did not remove AFO to assess this date  Ankle plantarflexion 4+ Did not remove AFO to assess this date  Ankle inversion    Ankle eversion    (Blank rows = not tested)  Manual Muscle Test Scale 0/5 = No muscle contraction can be seen or felt 1/5 = Contraction can be felt, but there is no motion 2-/5 = Part moves through incomplete ROM w/ gravity decreased 2/5 = Part moves through complete ROM w/ gravity decreased 2+/5 = Part moves through incomplete ROM (<50%) against gravity or through complete ROM w/ gravity 3-/5 = Part moves through incomplete ROM (>50%) against gravity 3/5 = Part moves through complete ROM against gravity 3+/5 = Part moves through complete ROM against gravity/slight resistance 4-/5= Holds test position against slight to moderate pressure 4/5 = Part moves through complete ROM against gravity/moderate resistance 4+/5= Holds test position against moderate to strong pressure 5/5 = Part moves through complete ROM against gravity/full resistance  BED MOBILITY:  (sleeps in regular bed, but has hospital bed available) Findings: Sit to supine CGA Supine to sit Min A and Mod A Rolling to Right Mod A Rolling to Left Min A and Mod A  TRANSFERS: Sit to stand: CGA and Min A  Assistive device utilized: Counselling psychologist     Stand to sit: CGA and Min A  Assistive device utilized: Tree surgeon to chair: Min A  Assistive device utilized: Counselling psychologist       RAMP:  Not tested  CURB:  Not tested  STAIRS: Not tested *need to assess*  GAIT: Findings: advances L LE with excessive hip external rotation using hip adductors, step to pattern, decreased arm swing- Left, decreased step length- Right, decreased stance time- Left, decreased stride length, decreased hip/knee flexion- Left, decreased ankle dorsiflexion- Left, lateral lean- Right, and decreased trunk rotation Distance walked: ~81ft Assistive device utilized: small based quad cane in R UE  Level of assistance: CGA and Min A  FUNCTIONAL TESTS:  5 times sit to stand: need to assess Timed up and go (TUG): need to assess 6 minute walk test: need to assess 10 meter walk test: 0.27m/s using small based quad cane and CGA Berg Balance Scale: need to assess  PATIENT  SURVEYS:  Stroke Impact Scale 03/15/2024: 49/80                                                                                                                               TREATMENT DATE: 04/05/2024  Pt arrives to clinic via transport chair to conserve his energy for session.   Gait training ~21ft into therapy room using SBQC (small based quad cane) with SBA for safety - performs improvement to reciprocal stepping pattern with pt reporting he is focusing on taking larger steps with R LE - continues to have excessive L LE hip external rotation, lacking L hip/knee flexion during swing phase, as well as lack of toe-off during terminal stance because of the excessive hip external rotation. Pt also continues to demo  decreased gait speed, but improving.   Pt agreeable to continue participating in higher intensity gait training today.   Gait training additional ~3ft out to hallway using [facility] with cuing to continue reciprocal pattern.  Gait training 148ft x2 (seated break between) using +2 R HHA with therapist providing skilled min assist for facilitating weight shifting. Pt demonstrating the following gait deviations with therapist providing the described cuing and facilitation for improvement:  HR 85bpm after 1st gait trail Donned 7lb AW to L LE on 2nd gait trial to increase intensity and neural recruitment Pt with some L lateral and posterior trunk lean when advancing R LE on 1st walk Pt with some compensatory movements to advance L LE during swing on 2nd gait when fatigued with the AW Skilled +2 assist used for R HHA today with ability to cue pt for improved midline upright posture and focusing on wt shifting rather than reliant on R UE support during L stance Provided mirror feedback on first half of both gait trials Cuing for shorter L LE step lengths and longer R LE step lengths to achieve consistent reciprocal stepping pattern - pt achieves reciprocal pattern >75% during both gait trials with mod cuing  No overt LOB occurs during gait training today with pt demonstrating improving gait endurance Continues to have full L knee extension during stance with potential slight hyperextension Pt able to advance L LE during swing without assist with moderate excessive hip external rotation (ER) Pt with improved ability to turn and sit at end of gait training when utilizing +2 skilled assistance  While still wearing 7lb AW on L LE, participated in stair navigation training ascending/descending 4 steps x2 (6" height) using R UE support on each HR with skilled min assist - cuing for step-through pattern on ascent for L LE NMR to improve L hip/knee flexion neural recruitment (not true reciprocal pattern), but only  performing step-to pattern on descent leading with L LE. During ascent, pt relying maximally on circumduction compensation to advance L LE for step-through pattern, but slight improvement in hip/knee flexion noted using AW today.     Therapist transported patient back up to main hospital entrance in  transport chair.    PATIENT EDUCATION: Education details: Therapy POC, LTGs, HEP, importance of improving L LE flexibility, findings during assessment  Person educated: Patient Education method: Explanation and Handouts Education comprehension: verbalized understanding and needs further education  HOME EXERCISE PROGRAM:  Access Code: [email or web address] URL: [email or web address] Date: 03/10/2024 Prepared by: Carlen Chasten  Exercises - Seated Hip Adduction Isometrics with Ball  - 1 x daily - 7 x weekly - 2 sets - 10 reps - 5 seconds hold - Supine Quadricep Sets  - 1 x daily - 7 x weekly - 2 sets - 10 reps - 3 seconds hold   GOALS: Goals reviewed with patient? Yes  SHORT TERM GOALS: Target date: 04/21/2024  Patient will be independent in home exercise program to improve strength/mobility for better functional independence with ADLs.  Baseline: initiated on 03/10/2024 Goal status: INITIAL   LONG TERM GOALS: Target date: 06/02/2024   Patient (< 30 years old) will complete five times sit to stand (5XSTS) test in < 10 seconds indicating an increased LE strength and improved balance.  Baseline: 03/15/2024: 16.53 seconds from standard green chair relying on R hand to push-up from armrest and pt's personal SBQC nearby, but not using it VERSUS 25.85 seconds with R hand across chest, with min A for balance/safety without use of UE support Goal status: INITIAL  2.  Patient will increase Berg Balance score to > 45/56 to demonstrate improved balance and decreased fall risk during functional activities and ADLs.  Baseline: 03/15/2024: 14/56 Goal status: INITIAL  3.  Patient will increase 10  meter walk test to >1.65m/s using LRAD as to improve gait speed for better community ambulation and to reduce fall risk.  Baseline: 0.30 m/s using small based quad cane with CGA and 1x light min A due to balance instability  Goal status: INITIAL  4.  Patient will increase six minute walk test distance to >552ft for progression towards community ambulator and improve gait ability  Baseline: 272ft with SBQC  Goal status: INITIAL  5.  Patient will reduce timed up and go to <15 seconds to reduce fall risk and demonstrate improved transfer/gait ability. Baseline: 03/15/2024: 25.32 seconds using SBQC and chair with armrest as well as CGA for safety/steadying Goal status: INITIAL  6.  Patient will ascend/descend 4 stairs, using railing, independently without loss of balance to improve ability to get in/out of home.   Baseline: 03/31/2024: using R UE support on each HR with skilled min assist for balance using primarily step-to pattern Goal status: INITIAL  ASSESSMENT:  CLINICAL IMPRESSION:   Patient is a 48 y.o. male who was seen today for physical therapy treatment for R hemiplegia (UE>LE) with increased tone, impaired balance, impaired functional mobility, impaired gait, and increased fall risk following CVA in 2020. Mr. Mallary is highly motivated to participate in therapy and improve his ability to walk. As noted above, pt demonstrates significant impairments in R LE strength and ROM as well as increased tone and significant sensory deficits impacting his functional mobility. Patient continues to report improved comfort of AFO following adjustments made by [facility]. Patient continued participation in higher intensity gait training using +2 R HHA to decrease reliance on R UE support during L stance phase with skilled +2 assist able to also cue for improved upright/midline posture. Pt tolerated additional challenge of increased AW resistance added to L LE during gait and stair navigation for increased L  LE neural recruitment. Patient with improving confidence with this set-up of gait  training. Pt continues to benefit from mirror feedback to improve upright/midline posture. Mr. Donabedian will benefit from further skilled PT to improve these deficits in order to increase QOL and ease/safety with ADLs.    OBJECTIVE IMPAIRMENTS: Abnormal gait, cardiopulmonary status limiting activity, decreased activity tolerance, decreased balance, decreased coordination, decreased endurance, decreased knowledge of use of DME, decreased mobility, difficulty walking, decreased ROM, decreased strength, hypomobility, impaired flexibility, impaired sensation, impaired tone, impaired UE functional use, improper body mechanics, postural dysfunction, and pain.   ACTIVITY LIMITATIONS: carrying, lifting, bending, standing, squatting, sleeping, stairs, transfers, bed mobility, continence, bathing, toileting, dressing, reach over head, hygiene/grooming, locomotion level, and caring for others  PARTICIPATION LIMITATIONS: meal prep, cleaning, laundry, medication management, shopping, community activity, and yard work  PERSONAL FACTORS: Age, Past/current experiences, Time since onset of injury/illness/exacerbation, Transportation, and 3+ comorbidities: HTN, hx of kidney stones, depression are also affecting patient's functional outcome.   REHAB POTENTIAL: Good  CLINICAL DECISION MAKING: Evolving/moderate complexity  EVALUATION COMPLEXITY: Moderate  PLAN:  PT FREQUENCY: 1-2x/week  PT DURATION: 12 weeks  PLANNED INTERVENTIONS: [id number]- PT Re-evaluation, 97750- Physical Performance Testing, 97110-Therapeutic exercises, 97530- Therapeutic activity, [id number]- Neuromuscular re-education, 97535- Self Care, [id number]- Manual therapy, [id number]- Gait training, [phone]- Orthotic/Prosthetic subsequent, [phone]- Canalith repositioning, [phone]- Electrical stimulation (manual), Patient/Family education, Balance training, Stair training, Taping, Dry Needling,  Joint mobilization, Joint manipulation, Vestibular training, Visual/preceptual remediation/compensation, DME instructions, Cryotherapy, Moist heat, and Biofeedback  PLAN FOR NEXT SESSION:  - high intensity gait training using +2 HHA vs litegait support vs [place] crutch - L LE stretching and weightbearing for tone management and to improve alignment during gait/standing  - bridges with R foot placed on unstable surface to promote increased L LE wt bearing - stair navigation for L LE NMR - progress HEP   Gloyd Happ, PT, DPT, NCS, CSRS Physical Therapist - [place]  [facility]  5:36 PM 04/05/24

## 2024-04-05 NOTE — Telephone Encounter (Signed)
 Pt called to request form be faxed to [facility] . Fax # [phone]

## 2024-04-07 ENCOUNTER — Ambulatory Visit: Admitting: Physical Therapy

## 2024-04-07 DIAGNOSIS — I69354 Hemiplegia and hemiparesis following cerebral infarction affecting left non-dominant side: Secondary | ICD-10-CM

## 2024-04-07 DIAGNOSIS — R2689 Other abnormalities of gait and mobility: Secondary | ICD-10-CM

## 2024-04-07 DIAGNOSIS — R2681 Unsteadiness on feet: Secondary | ICD-10-CM | POA: Diagnosis not present

## 2024-04-07 DIAGNOSIS — M6281 Muscle weakness (generalized): Secondary | ICD-10-CM | POA: Diagnosis not present

## 2024-04-07 DIAGNOSIS — R278 Other lack of coordination: Secondary | ICD-10-CM

## 2024-04-07 DIAGNOSIS — R29818 Other symptoms and signs involving the nervous system: Secondary | ICD-10-CM

## 2024-04-07 NOTE — Therapy (Signed)
 OUTPATIENT PHYSICAL THERAPY NEURO TREATMENT   Patient Name: Matthew Black MRN: [id number] DOB:May 19, 1976, 48 y.o., male Today's Date: 04/07/2024   PCP: Sylvia Everts, PA-C REFERRING PROVIDER: Liam Redhead, MD  END OF SESSION:   PT End of Session - 04/07/24 1544     Visit Number 8    Number of Visits 24    Date for PT Re-Evaluation 06/02/24    Authorization Type [employer]ICARE    Authorization Time Period 03/10/24-06/02/24    PT Start Time 1541    PT Stop Time 1619    PT Time Calculation (min) 38 min    Equipment Utilized During Treatment Gait belt;Other (comment)   L LE AFO   Activity Tolerance Patient tolerated treatment well    Behavior During Therapy [facility] for tasks assessed/performed                 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   Stroke (HCC)    RICA, R A1, R MCA occlusion 07/19/19   Past Surgical History:  Procedure Laterality Date   CRANIOPLASTY Right 06/05/2020   Procedure: CRANIOPLASTY;  Surgeon: Augusto Blonder, MD;  Location: [facility] OR;  Service: Neurosurgery;  Laterality: Right;  right   CRANIOTOMY Right 07/19/2019   Procedure: RIGHT HEMI-CRANIECTOMY With implantation of skull flap to abdominal wall;  Surgeon: Augusto Blonder, MD;  Location: [facility] OR;  Service: Neurosurgery;  Laterality: Right;   CYST EXCISION N/A 10/08/2016   Procedure: EXCISION OF POSTERIOR NECK CYST;  Surgeon: Adalberto Acton, MD;  Location: [facility] ORS;  Service: General;  Laterality: N/A;   CYSTOSCOPY/URETEROSCOPY/HOLMIUM LASER/STENT PLACEMENT Right 03/16/2020    Procedure: CYSTOSCOPY RIGHT RETROGRADE PYELOGRAM URETEROSCOPY/HOLMIUM LASER/STENT PLACEMENT;  Surgeon: Samson Croak, MD;  Location: [facility] ORS;  Service: Urology;  Laterality: Right;   INCISION AND DRAINAGE ABSCESS N/A 09/22/2014   Procedure: INCISION AND DRAINAGE ABSCESS POSTERIOR NECK;  Surgeon: Azucena Bollard, MD;  Location: [facility] ORS;  Service: General;  Laterality: N/A;   INCISION AND DRAINAGE ABSCESS N/A 12/20/2015   Procedure: INCISION AND DRAINAGE POSTERIOR NECK MASS;  Surgeon: Oralee Billow, MD;  Location: [facility]S;  Service: General;  Laterality: N/A;   INCISION AND DRAINAGE ABSCESS Left 07/10/2004   middle finger   IR IVC FILTER PLMT / S&I /IMG GUID/MOD SED  07/21/2019   IR RADIOLOGIST EVAL & MGMT  12/14/2019   IR RADIOLOGIST EVAL & MGMT  12/21/2020   IR VENOGRAM RENAL UNI RIGHT  07/21/2019   MASS EXCISION N/A 07/21/2017   Procedure: EXCISION OF BENIGN NECK LESION WITH LAYERED CLOSURE;  Surgeon: Alger Infield, MD;  Location: [facility];  Service: Plastics;  Laterality: N/A;   MASS EXCISION N/A 11/10/2017   Procedure: EXCISION BENIGN LESION OF THE NECK WITH LAYERED CLOSURE;  Surgeon: Alger Infield, MD;  Location: [facility];  Service: Plastics;  Laterality: N/A;   Patient Active Problem List   Diagnosis Date Noted   Hx of adenomatous colonic polyps 06/01/2023   COVID-19 virus infection 04/21/2021  Left-sided weakness 04/21/2021   S/P craniotomy 06/05/2020   History of cranioplasty 06/05/2020   Peri-rectal abscess 02/14/2020   Abnormal CT scan, pelvis 02/14/2020   Pancytopenia (HCC) 12/02/2019   Nephrolithiasis 12/02/2019   Hydronephrosis with renal and ureteral calculus obstruction 12/02/2019   Rectal pain 12/02/2019   Hyperkalemia 12/02/2019   Reactive depression    Wound infection after surgery    Sleep disturbance    Dysphagia, post-stroke    Transaminitis    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   Cerebral abscess     Urinary tract infection without hematuria    Altered mental status    Primary hypercoagulable state (HCC)    Acute pulmonary embolism without acute cor pulmonale (HCC)    Deep vein thrombosis (DVT) of non-extremity vein    Hypokalemia    Acute blood loss anemia    Leukocytosis    Endotracheal tube present    Acute respiratory failure with hypoxemia (HCC)    Stroke (cerebrum) (HCC) 07/19/2019   Pressure injury of skin 07/19/2019   Acute CVA (cerebrovascular accident) (HCC)    Encephalopathy    Dysphagia    Acute encephalopathy    Essential hypertension    Obesity 03/12/2016   Scalp abscess 12/20/2015   Neck abscess 12/20/2015   Pilonidal cyst 02/08/2013    ONSET DATE: stroke in August 2020  REFERRING DIAG: I63.9 (ICD-10-CM) - Cerebrovascular accident (CVA), unspecified mechanism (HCC)   THERAPY DIAG:   Muscle weakness (generalized)  Other lack of coordination  Other abnormalities of gait and mobility  Unsteadiness on feet  Hemiplegia and hemiparesis following cerebral infarction affecting left non-dominant side (HCC)  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)  Other symptoms and signs involving the nervous system  Rationale for Evaluation and Treatment: Rehabilitation  SUBJECTIVE:                                                                                                                                                                                             SUBJECTIVE STATEMENT:  Pt excited for therapy stating "I'm ready to work." Pt reports his AFO is hurting when wearing his black Nike shoes, but states when he wears his white shoes he doesn't have any pain. Pt states even after taking the AFO off at night, that "it hurts all night." Stating it is "about the same" as the pain he was having prior to [facility] making recent AFO adjustments. Continues to report pain is still located mostly in his calf and it is a "deep"  pain. States he can stand and put pressure through  his LE and it will help with the pain, otherwise he has to take the brace off.  Denies stumbles/falls since last therapy session.    Initial Eval: Patient reports his goal is to walk without using a cane and without assistance. Patient states he wants to walk like he did before having the stroke. Reports his stroke was in August of 2020. States he currently uses a quad cane for ambulation and tries to walk as much as possible. Reports he has a manual wheelchair, but states he tries not to use it because it depresses him and makes him feel like he is back to when he first had his stroke. Reports wearing L LE custom AFO at all times.   Reports he is currently doing everything mod-I, but states he could call his mom, dad, or brothers if he needed assistance.  Reports he has routine botox  injections in L UE to help manage tone, but doesn't receive any in his LE.  Reports he has a hospital bed, but sleeps in a regular bed.  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  Etiology of carotid occlusion unclear possibly dissection.  He had a prolonged hospital admission with several complications including DVT, pulmonary embolism, hemorrhagic transformation, abdominal wall hematoma, UTI but made quite remarkable recovery   PAIN:  Are you having pain? No, denies pain today, but states sometimes will have L shoulder pain  PRECAUTIONS: Fall  RED FLAGS: None   WEIGHT BEARING RESTRICTIONS: No  FALLS: Has patient fallen in last 6  months? Yes, stating he fell down his stairs ~5 months ago and is now scared to perform stair navigation  LIVING ENVIRONMENT: Lives with: lives alone Lives in: House/apartment Stairs: Yes: Internal: flight steps; can reach both and External: 4 steps; can reach both, but states he doesn't have to go upstairs Has following equipment at home: Quad cane small base, Hemi walker, Wheelchair (manual), shower chair, Shower bench, and L LE custom AFO Pt has custom K5 wheelchair with rigid, contoured back support and Roho cushion. Pt reports he calls [facility] to come adjust his seat cushion when he feels it is getting uncomfortable.  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  DIAGNOSTIC FINDINGS:  EXAM: CT ANGIOGRAPHY HEAD AND NECK WITH AND WITHOUT CONTRAST IMPRESSION: 1. No acute intracranial abnormality. 2. Chronically occluded right ICA with minimal opacification of the right MCA and ACA branches. 3. Redemonstrated extensive encephalomalacia in the right MCA and ACA territories secondary to a prior infarct.  Electronically Signed   By: Clora Dane M.D.   On: 11/27/2023 13:45  COGNITION: Overall cognitive status: Within functional limits for tasks assessed  VISION (03/29/2024): Therapist performed visual field screen with pt appearing to have L homonomous hemianopsia - pt confirms  he has loss of vision on L side.   SENSATION: Light touch: Impaired in L LE Proprioception: Impaired  in L LE  Can feel deep pressure in L LE    COORDINATION: Impaired in L LE due to paresis, hypertonia, and overall decreased flexibility  EDEMA:  Not formally assessed  MUSCLE TONE: LLE: Mild, Moderate, and Hypertonic  MUSCLE LENGTH: Not formally assessed; however, demonstrates L hamstring muscle tightness  DTRs:  Not formally assessed  POSTURE: rounded shoulders, forward head, and weight shift right  LOWER EXTREMITY ROM:       Right Eval Active ROM Left Eval Passive ROM  Hip flexion WFL Achieves at least 90  Hip extension    Hip abduction    Hip adduction    Hip internal rotation  Unable to achieve neutral hip rotation, lacking internal rotation  Hip external rotation  [facility] and rests in excessive ER  Knee flexion [facility]  Knee extension WFL Able to achieve terminal knee extension in supine, but with significant discomfort due to hamstring tone  Ankle dorsiflexion WFL Did not remove AFO to assess this date  Ankle plantarflexion WFL Did not remove AFO to assess this date  Ankle inversion    Ankle eversion     (Blank rows = not tested)  LOWER EXTREMITY MMT:    MMT Right Eval Left Eval  Hip flexion 4+ 2-  (compensates with hip adductor activation)  Hip extension    Hip abduction    Hip adduction  3+  Hip internal rotation  2-  Hip external rotation  2  Knee flexion 4+ 1  Knee extension 4+ 3+  Ankle dorsiflexion 4+ Did not remove AFO to assess this date  Ankle plantarflexion 4+ Did not remove AFO to assess this date  Ankle inversion    Ankle eversion    (Blank rows = not tested)  Manual Muscle Test Scale 0/5 = No muscle contraction can be seen or felt 1/5 = Contraction can be felt, but there is no motion 2-/5 = Part moves through incomplete ROM w/ gravity decreased 2/5 = Part moves through complete ROM w/ gravity decreased 2+/5 = Part moves through incomplete ROM (<50%) against gravity or through complete ROM w/ gravity 3-/5 = Part moves through incomplete ROM (>50%) against gravity 3/5 = Part  moves through complete ROM against gravity 3+/5 = Part moves through complete ROM against gravity/slight resistance 4-/5= Holds test position against slight to moderate pressure 4/5 = Part moves through complete ROM against gravity/moderate resistance 4+/5= Holds test position against moderate to strong pressure 5/5 = Part moves through complete ROM against gravity/full resistance  BED MOBILITY: (sleeps in regular bed, but has hospital bed available) Findings: Sit to supine CGA Supine to sit Min A and Mod A Rolling to Right Mod A Rolling to Left Min A and Mod A  TRANSFERS: Sit to stand: CGA and Min A  Assistive device utilized: Counselling psychologist     Stand to sit: CGA and Min A  Assistive device utilized: Tree surgeon to chair: Min A  Assistive device utilized: Counselling psychologist       RAMP:  Not tested  CURB:  Not tested  STAIRS: Not tested *need to assess*  GAIT: Findings: advances L LE with excessive hip external rotation using hip adductors, step to pattern, decreased arm swing- Left, decreased step length- Right, decreased stance time- Left, decreased stride length, decreased hip/knee flexion- Left,  decreased ankle dorsiflexion- Left, lateral lean- Right, and decreased trunk rotation Distance walked: ~94ft Assistive device utilized: small based quad cane in R UE  Level of assistance: CGA and Min A  FUNCTIONAL TESTS:  5 times sit to stand: need to assess Timed up and go (TUG): need to assess 6 minute walk test: need to assess 10 meter walk test: 0.2m/s using small based quad cane and CGA Berg Balance Scale: need to assess  PATIENT SURVEYS:  Stroke Impact Scale 03/15/2024: 49/80                                                                                                                               TREATMENT DATE: 04/07/2024  Pt arrives late to clinic via transport chair to conserve his energy for session.   Pt agreeable to continue  participating in higher intensity gait training today.   Therapist assessed L LE AFO fit with pt reporting pain located deep in calf area, may be from brace causing increased ankle DF stretch. Therapist to reach out to [facility] to discuss AFO in upcoming visits.   Gait training ~15ft out to hallway using SBQC (small based quad cane) with cuing to continue reciprocal pattern with SBA for safety - continues to have excessive L LE hip external rotation, lacking L hip/knee flexion during swing phase, as well as lack of toe-off during terminal stance because of the excessive hip external rotation. Pt also continues to demo decreased gait speed, but improving.   Gait training 162ft + ~230ft (seated break between) using +2 R HHA with therapist providing skilled min assist for facilitating weight shifting. Pt demonstrating the following gait deviations with therapist providing the described cuing and facilitation for improvement:  Donned 7lb AW to L LE on 2nd gait trial to increase intensity and neural recruitment for a longer gait distance today! Improving ability to roll off L toes during toe off/pre-swing as pt is more consistently taking longer step lengths on R LE for more consistent reciprocal pattern May benefit from L AFO adjustment to a hinged brace rather than a rigid ankle Therapist facilitating forward progression of pelvis over L stance phase for increased R step length as well as weight shifting onto stance limb Also facilitating to decrease excessive L hip external rotation/rotation of pelvis Provided mirror feedback on first half of both gait trials Cuing for shorter L LE step lengths and longer R LE step lengths to achieve consistent reciprocal stepping pattern - pt achieves reciprocal pattern >75% during both gait trials with mod cuing  Pt with 2x minor LOB when pt catching L toes during swing due to pt having improved LE alignment with decreased excessive hip external rotation (rigid AFO  doesn't allow pt to roll through toes like he needs) Continues to have full L knee extension during stance with potential slight hyperextension Pt able to advance L LE during swing without assist with decreased excessive hip external rotation (ER)! Pt  with improved ability to turn and sit at end of gait training with increasing comfort with set-up     PATIENT EDUCATION: Education details: Therapy POC, LTGs, HEP, importance of improving L LE flexibility, findings during assessment  Person educated: Patient Education method: Explanation and Handouts Education comprehension: verbalized understanding and needs further education  HOME EXERCISE PROGRAM:  Access Code: [email or web address] URL: [email or web address] Date: 03/10/2024 Prepared by: Carlen Chasten  Exercises - Seated Hip Adduction Isometrics with Ball  - 1 x daily - 7 x weekly - 2 sets - 10 reps - 5 seconds hold - Supine Quadricep Sets  - 1 x daily - 7 x weekly - 2 sets - 10 reps - 3 seconds hold   GOALS: Goals reviewed with patient? Yes  SHORT TERM GOALS: Target date: 04/21/2024  Patient will be independent in home exercise program to improve strength/mobility for better functional independence with ADLs.  Baseline: initiated on 03/10/2024 Goal status: INITIAL   LONG TERM GOALS: Target date: 06/02/2024   Patient (< 2 years old) will complete five times sit to stand (5XSTS) test in < 10 seconds indicating an increased LE strength and improved balance.  Baseline: 03/15/2024: 16.53 seconds from standard green chair relying on R hand to push-up from armrest and pt's personal SBQC nearby, but not using it VERSUS 25.85 seconds with R hand across chest, with min A for balance/safety without use of UE support Goal status: INITIAL  2.  Patient will increase Berg Balance score to > 45/56 to demonstrate improved balance and decreased fall risk during functional activities and ADLs.  Baseline: 03/15/2024: 14/56 Goal status:  INITIAL  3.  Patient will increase 10 meter walk test to >1.42m/s using LRAD as to improve gait speed for better community ambulation and to reduce fall risk.  Baseline: 0.30 m/s using small based quad cane with CGA and 1x light min A due to balance instability  Goal status: INITIAL  4.  Patient will increase six minute walk test distance to >571ft for progression towards community ambulator and improve gait ability  Baseline: 244ft with SBQC  Goal status: INITIAL  5.  Patient will reduce timed up and go to <15 seconds to reduce fall risk and demonstrate improved transfer/gait ability. Baseline: 03/15/2024: 25.32 seconds using SBQC and chair with armrest as well as CGA for safety/steadying Goal status: INITIAL  6.  Patient will ascend/descend 4 stairs, using railing, independently without loss of balance to improve ability to get in/out of home.   Baseline: 03/31/2024: using R UE support on each HR with skilled min assist for balance using primarily step-to pattern Goal status: INITIAL  ASSESSMENT:  CLINICAL IMPRESSION:   Patient is a 48 y.o. male who was seen today for physical therapy treatment for L hemiplegia (UE>LE) with increased tone, impaired balance, impaired functional mobility, impaired gait, and increased fall risk following CVA in 2020. Mr. Tatar is highly motivated to participate in therapy and improve his ability to walk. As noted above, pt demonstrates significant impairments in L LE strength and ROM as well as increased tone and significant sensory deficits impacting his functional mobility. Patient reporting pain from AFO despite recent adjustments made by [facility], specifically when wearing his black Nike shoes. Patient continued participation in higher intensity gait training using +2 R HHA to decrease reliance on R UE support during L stance phase with pt advancing to gait training 283ft without seated rest break! Pt continued to have positive response to 7lb AW resistance  added to  L LE during gait for increased neural recruitment. Patient with improving consistency of reciprocal stepping pattern! Pt continues to benefit from mirror feedback to improve upright/midline posture. Mr. Press will benefit from further skilled PT to improve these deficits in order to increase QOL and ease/safety with ADLs.    OBJECTIVE IMPAIRMENTS: Abnormal gait, cardiopulmonary status limiting activity, decreased activity tolerance, decreased balance, decreased coordination, decreased endurance, decreased knowledge of use of DME, decreased mobility, difficulty walking, decreased ROM, decreased strength, hypomobility, impaired flexibility, impaired sensation, impaired tone, impaired UE functional use, improper body mechanics, postural dysfunction, and pain.   ACTIVITY LIMITATIONS: carrying, lifting, bending, standing, squatting, sleeping, stairs, transfers, bed mobility, continence, bathing, toileting, dressing, reach over head, hygiene/grooming, locomotion level, and caring for others  PARTICIPATION LIMITATIONS: meal prep, cleaning, laundry, medication management, shopping, community activity, and yard work  PERSONAL FACTORS: Age, Past/current experiences, Time since onset of injury/illness/exacerbation, Transportation, and 3+ comorbidities: HTN, hx of kidney stones, depression are also affecting patient's functional outcome.   REHAB POTENTIAL: Good  CLINICAL DECISION MAKING: Evolving/moderate complexity  EVALUATION COMPLEXITY: Moderate  PLAN:  PT FREQUENCY: 1-2x/week  PT DURATION: 12 weeks  PLANNED INTERVENTIONS: 97164- PT Re-evaluation, 97750- Physical Performance Testing, 97110-Therapeutic exercises, 97530- Therapeutic activity, [id number]- Neuromuscular re-education, 97535- Self Care, [id number]- Manual therapy, [id number]- Gait training, [phone]- Orthotic/Prosthetic subsequent, [phone]- Canalith repositioning, [phone]- Electrical stimulation (manual), Patient/Family education, Balance training, Stair  training, Taping, Dry Needling, Joint mobilization, Joint manipulation, Vestibular training, Visual/preceptual remediation/compensation, DME instructions, Cryotherapy, Moist heat, and Biofeedback  PLAN FOR NEXT SESSION:  - call [facility] to discuss potential transition to hinged AFO  - high intensity gait training using +2 HHA vs litegait support vs L[place]strand crutch  - continue adding AW to L LE - L LE stretching and weightbearing for tone management and to improve alignment during gait/standing  - bridges with R foot placed on unstable surface to promote increased L LE wt bearing - stair navigation for L LE NMR - progress HEP - follow-up on when pt's next apt is for botox     Doran Nestle, PT, DPT, NCS, CSRS Physical Therapist - [place]  [facility]  4:20 PM 04/07/24

## 2024-04-12 ENCOUNTER — Ambulatory Visit: Admitting: Physical Therapy

## 2024-04-12 DIAGNOSIS — I69354 Hemiplegia and hemiparesis following cerebral infarction affecting left non-dominant side: Secondary | ICD-10-CM

## 2024-04-12 DIAGNOSIS — R2681 Unsteadiness on feet: Secondary | ICD-10-CM

## 2024-04-12 DIAGNOSIS — R2689 Other abnormalities of gait and mobility: Secondary | ICD-10-CM | POA: Diagnosis not present

## 2024-04-12 DIAGNOSIS — M6281 Muscle weakness (generalized): Secondary | ICD-10-CM | POA: Diagnosis not present

## 2024-04-12 DIAGNOSIS — R278 Other lack of coordination: Secondary | ICD-10-CM

## 2024-04-12 DIAGNOSIS — R29818 Other symptoms and signs involving the nervous system: Secondary | ICD-10-CM | POA: Diagnosis not present

## 2024-04-12 NOTE — Therapy (Signed)
 OUTPATIENT PHYSICAL THERAPY NEURO TREATMENT   Patient Name: Matthew Black MRN: [id number] DOB:1976/05/14, 48 y.o., male Today's Date: 04/12/2024   PCP: Sylvia Everts, PA-C REFERRING PROVIDER: Liam Redhead, MD  END OF SESSION:   PT End of Session - 04/12/24 1532     Visit Number 9    Number of Visits 24    Date for PT Re-Evaluation 06/02/24    Authorization Type [employer] MEDICARE    Authorization Time Period 03/10/24-06/02/24    PT Start Time 1535    PT Stop Time 1616    PT Time Calculation (min) 41 min    Equipment Utilized During Treatment Gait belt;Other (comment)   L LE AFO   Activity Tolerance Patient tolerated treatment well    Behavior During Therapy [facility] for tasks assessed/performed                  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   Stroke (HCC)    RICA, R A1, R MCA occlusion 07/19/19   Past Surgical History:  Procedure Laterality Date   CRANIOPLASTY Right 06/05/2020   Procedure: CRANIOPLASTY;  Surgeon: Augusto Blonder, MD;  Location: [facility] OR;  Service: Neurosurgery;  Laterality: Right;  right   CRANIOTOMY Right 07/19/2019   Procedure: RIGHT HEMI-CRANIECTOMY With implantation of skull flap to abdominal wall;  Surgeon: Augusto Blonder, MD;  Location: [facility] OR;  Service: Neurosurgery;  Laterality: Right;   CYST EXCISION N/A 10/08/2016   Procedure: EXCISION OF POSTERIOR NECK CYST;  Surgeon: Adalberto Acton, MD;  Location: [facility] ORS;  Service: General;  Laterality: N/A;   CYSTOSCOPY/URETEROSCOPY/HOLMIUM LASER/STENT PLACEMENT Right  03/16/2020   Procedure: CYSTOSCOPY RIGHT RETROGRADE PYELOGRAM URETEROSCOPY/HOLMIUM LASER/STENT PLACEMENT;  Surgeon: Samson Croak, MD;  Location: [facility] ORS;  Service: Urology;  Laterality: Right;   INCISION AND DRAINAGE ABSCESS N/A 09/22/2014   Procedure: INCISION AND DRAINAGE ABSCESS POSTERIOR NECK;  Surgeon: Azucena Bollard, MD;  Location: [facility] ORS;  Service: General;  Laterality: N/A;   INCISION AND DRAINAGE ABSCESS N/A 12/20/2015   Procedure: INCISION AND DRAINAGE POSTERIOR NECK MASS;  Surgeon: Oralee Billow, MD;  Location: [facility];  Service: General;  Laterality: N/A;   INCISION AND DRAINAGE ABSCESS Left 07/10/2004   middle finger   IR IVC FILTER PLMT / S&I /IMG GUID/MOD SED  07/21/2019   IR RADIOLOGIST EVAL & MGMT  12/14/2019   IR RADIOLOGIST EVAL & MGMT  12/21/2020   IR VENOGRAM RENAL UNI RIGHT  07/21/2019   MASS EXCISION N/A 07/21/2017   Procedure: EXCISION OF BENIGN NECK LESION WITH LAYERED CLOSURE;  Surgeon: Alger Infield, MD;  Location: [facility];  Service: Plastics;  Laterality: N/A;   MASS EXCISION N/A 11/10/2017   Procedure: EXCISION BENIGN LESION OF THE NECK WITH LAYERED CLOSURE;  Surgeon: Alger Infield, MD;  Location: [facility];  Service: Plastics;  Laterality: N/A;   Patient Active Problem List   Diagnosis Date Noted   Hx of adenomatous colonic polyps 06/01/2023   COVID-19 virus infection  04/21/2021   Left-sided weakness 04/21/2021   S/P craniotomy 06/05/2020   History of cranioplasty 06/05/2020   Peri-rectal abscess 02/14/2020   Abnormal CT scan, pelvis 02/14/2020   Pancytopenia (HCC) 12/02/2019   Nephrolithiasis 12/02/2019   Hydronephrosis with renal and ureteral calculus obstruction 12/02/2019   Rectal pain 12/02/2019   Hyperkalemia 12/02/2019   Reactive depression    Wound infection after surgery    Sleep disturbance    Dysphagia, post-stroke    Transaminitis    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   Cerebral  abscess    Urinary tract infection without hematuria    Altered mental status    Primary hypercoagulable state (HCC)    Acute pulmonary embolism without acute cor pulmonale (HCC)    Deep vein thrombosis (DVT) of non-extremity vein    Hypokalemia    Acute blood loss anemia    Leukocytosis    Endotracheal tube present    Acute respiratory failure with hypoxemia (HCC)    Stroke (cerebrum) (HCC) 07/19/2019   Pressure injury of skin 07/19/2019   Acute CVA (cerebrovascular accident) (HCC)    Encephalopathy    Dysphagia    Acute encephalopathy    Essential hypertension    Obesity 03/12/2016   Scalp abscess 12/20/2015   Neck abscess 12/20/2015   Pilonidal cyst 02/08/2013    ONSET DATE: stroke in August 2020  REFERRING DIAG: I63.9 (ICD-10-CM) - Cerebrovascular accident (CVA), unspecified mechanism (HCC)   THERAPY DIAG:   Muscle weakness (generalized)  Other lack of coordination  Other abnormalities of gait and mobility  Unsteadiness on feet  Hemiplegia and hemiparesis following cerebral infarction affecting left non-dominant side (HCC)  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)  Other symptoms and signs involving the nervous system   Rationale for Evaluation and Treatment: Rehabilitation  SUBJECTIVE:                                                                                                                                                                                             SUBJECTIVE STATEMENT:  Pt reports he is doing "fine." Pt states when he was walking at [facility] yesterday, his L toe caught during swing advancement on an incline, causing LOB, but he was able to catch himself on his vehicle. Denies falls.   Denies pain currently; however, reports he is continuing to have pain in his L calf from the AFO.    Initial Eval: Patient reports his goal is to walk without using a cane and without assistance. Patient states  he wants to walk like he did before having  the stroke. Reports his stroke was in August of 2020. States he currently uses a quad cane for ambulation and tries to walk as much as possible. Reports he has a manual wheelchair, but states he tries not to use it because it depresses him and makes him feel like he is back to when he first had his stroke. Reports wearing L LE custom AFO at all times.   Reports he is currently doing everything mod-I, but states he could call his mom, dad, or brothers if he needed assistance.  Reports he has routine botox  injections in L UE to help manage tone, but doesn't receive any in his LE.  Reports he has a hospital bed, but sleeps in a regular bed.  Pt accompanied by: self  PERTINENT HISTORY: L hemiparesis s/p R ACA and MCA territory CVA in August 2020, HTN, hx of kidney stones, depression  Per Neurology MD note on 11/24/2023: 57-year-old African-American male with large right hemispheric infarct due to right internal carotid and middle cerebral artery occlusion with cytotoxic edema and brain herniation s/p hemicraniectomy in 06/2019 is doing reasonably well with residual left spastic hemiparesis and left hemianopia.  Etiology of carotid occlusion unclear possibly dissection.  He had a prolonged hospital admission with several complications including DVT, pulmonary embolism, hemorrhagic transformation, abdominal wall hematoma, UTI but made quite remarkable recovery   PAIN:  Are you having pain? No, denies pain today, but states sometimes will have L shoulder pain  PRECAUTIONS: Fall  RED FLAGS: None   WEIGHT BEARING RESTRICTIONS: No  FALLS: Has patient fallen in last 6 months? Yes, stating he fell down his stairs ~5 months ago and is now scared to perform stair navigation  LIVING ENVIRONMENT: Lives with: lives alone Lives in: House/apartment Stairs: Yes: Internal: flight steps; can reach both and External: 4 steps; can reach both, but states he doesn't have  to go upstairs Has following equipment at home: Quad cane small base, Hemi walker, Wheelchair (manual), shower chair, Shower bench, and L LE custom AFO Pt has custom K5 wheelchair with rigid, contoured back support and Roho cushion. Pt reports he calls [employer] to come adjust his seat cushion when he feels it is getting uncomfortable.  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  DIAGNOSTIC FINDINGS:  EXAM: CT ANGIOGRAPHY HEAD AND NECK WITH AND WITHOUT CONTRAST IMPRESSION: 1. No acute intracranial abnormality. 2. Chronically occluded right ICA with minimal opacification of the right MCA and ACA branches. 3. Redemonstrated extensive encephalomalacia in the right MCA and ACA territories secondary to a prior infarct.  Electronically Signed   By: Clora Dane M.D.   On: 11/27/2023 13:45  COGNITION: Overall cognitive status: Within functional limits for tasks assessed  VISION (03/29/2024): Therapist performed visual field screen with pt appearing to have L homonomous hemianopsia - pt confirms he has loss of vision on L side.   SENSATION: Light touch: Impaired in L LE Proprioception: Impaired  in L LE  Can feel deep pressure in L LE   COORDINATION: Impaired in L LE due to paresis, hypertonia, and overall decreased flexibility  EDEMA:  Not formally assessed  MUSCLE  TONE: LLE: Mild, Moderate, and Hypertonic  MUSCLE LENGTH: Not formally assessed; however, demonstrates L hamstring muscle tightness  DTRs:  Not formally  assessed  POSTURE: rounded shoulders, forward head, and weight shift right  LOWER EXTREMITY ROM:       Right Eval Active ROM Left Eval Passive ROM  Hip flexion WFL Achieves at least 90  Hip extension    Hip abduction    Hip adduction    Hip internal rotation  Unable to achieve neutral hip rotation, lacking internal rotation  Hip external rotation  [facility] and rests in excessive ER  Knee flexion [facility]  Knee extension WFL Able to achieve terminal knee extension in supine, but with significant discomfort due to hamstring tone  Ankle dorsiflexion WFL Did not remove AFO to assess this date  Ankle plantarflexion WFL Did not remove AFO to assess this date  Ankle inversion    Ankle eversion     (Blank rows = not tested)  LOWER EXTREMITY MMT:    MMT Right Eval Left Eval  Hip flexion 4+ 2-  (compensates with hip adductor activation)  Hip extension    Hip abduction    Hip adduction  3+  Hip internal rotation  2-  Hip external rotation  2  Knee flexion 4+ 1  Knee extension 4+ 3+  Ankle dorsiflexion 4+ Did not remove AFO to assess this date  Ankle plantarflexion 4+ Did not remove AFO to assess this date  Ankle inversion    Ankle eversion    (Blank rows = not tested)  Manual Muscle Test Scale 0/5 = No muscle contraction can be seen or felt 1/5 = Contraction can be felt, but there is no motion 2-/5 = Part moves through incomplete ROM w/ gravity decreased 2/5 = Part moves through complete ROM w/ gravity decreased 2+/5 = Part moves through incomplete ROM (<50%) against gravity or through complete ROM w/ gravity 3-/5 = Part moves through incomplete ROM (>50%) against gravity 3/5 = Part moves through complete ROM against gravity 3+/5 = Part moves through complete ROM against gravity/slight resistance 4-/5= Holds test position against slight to moderate pressure 4/5 = Part moves through complete ROM against gravity/moderate resistance 4+/5= Holds test position against moderate to  strong pressure 5/5 = Part moves through complete ROM against gravity/full resistance  BED MOBILITY: (sleeps in regular bed, but has hospital bed available) Findings: Sit to supine CGA Supine to sit Min A and Mod A Rolling to Right Mod A Rolling to Left Min A and Mod A  TRANSFERS: Sit to stand: CGA and Min A  Assistive device utilized: Counselling psychologist     Stand to sit: CGA and Min A  Assistive device utilized: Tree surgeon to chair: Min A  Assistive device utilized: Counselling psychologist       RAMP:  Not tested  CURB:  Not tested  STAIRS: Not tested *need to assess*  GAIT: Findings: advances L LE with excessive hip external rotation using hip adductors, step to pattern, decreased arm swing- Left, decreased step length- Right, decreased stance time- Left, decreased stride length, decreased hip/knee flexion- Left, decreased ankle dorsiflexion- Left, lateral lean- Right, and decreased trunk rotation Distance walked: ~30ft Assistive device utilized: small based quad cane in R UE  Level of assistance: CGA and Min A  FUNCTIONAL TESTS:  5 times sit to stand: need to assess Timed up and go (TUG): need to assess 6 minute  walk test: need to assess 10 meter walk test: 0.30m/s using small based quad cane and CGA Berg Balance Scale: need to assess  PATIENT SURVEYS:  Stroke Impact Scale 03/15/2024: 49/80                                                                                                                               TREATMENT DATE: 04/12/2024  Pt agreeable to continue participating in higher intensity gait training today.   Therapist called [facility] to request scheduling pt a follow-up appointment due to pt continuing to have pain in L LE despite recent AFO adjustments. Therapist also requested [facility] call therapist to discuss potential AFO needs given pt's recent gait advancements.   Gait training ~40ft into therapy clinic hallway using SBQC (small  based quad cane) with cuing to continue reciprocal pattern with SBA for safety - continues to have excessive L LE hip external rotation, lacking L hip/knee flexion during swing phase, as well as lack of toe-off during terminal stance because of the excessive hip external rotation and likely because of the rigid AFO. Pt also demos improving gait speed now that he is achieving a more consistent reciprocal stepping pattern.  Gait training 216ft + ~243ft (seated break between) using +2 R HHA with therapist providing skilled min assist for facilitating weight shifting and improved L hip activation. Pt demonstrating the following gait deviations with therapist providing the described cuing and facilitation for improvement:  Donned 7lb AW to L LE on 2nd gait trial to increase intensity and neural recruitment sustaining the longer gait distance today! Improving ability to roll off L toes during toe off/pre-swing as pt is more consistently taking longer step lengths on R LE for more consistent reciprocal pattern May benefit from L AFO adjustment to a hinged brace rather than a rigid ankle Therapist called [facility] to request [facility] call therapist to discuss bracing needs Therapist facilitating forward progression of pelvis over L stance phase for increased R step length as well as weight shifting onto stance limb Also facilitating to decrease excessive L hip external rotation/rotation of pelvis Provided mirror feedback on first half of both gait trials Cuing for shorter L LE step lengths and longer R LE step lengths to achieve consistent reciprocal stepping pattern - pt achieves reciprocal pattern >80% during both gait trials with mod cuing  Pt with 2x minor LOB when pt catching L toes during swing due to pt having improved LE alignment with decreased excessive hip external rotation (rigid AFO doesn't allow pt to roll through toes like he needs) Continues to have full L knee extension during stance with  potential slight hyperextension Pt continues to be able to advance L LE during swing without assist with decreased excessive hip external rotation (ER)! Pt with improved ability to turn and sit at end of gait training with increasing comfort with set-up +2 assist reports pt not using as much support through his R hand  than he was in the beginning   Pt continues to progress gait distance!  Pt reports being "tired" after gait training today.   Pt transported back up to front of building by therapy aide.    PATIENT EDUCATION: Education details: Therapy POC, LTGs, HEP, importance of improving L LE flexibility, findings during assessment  Person educated: Patient Education method: Explanation and Handouts Education comprehension: verbalized understanding and needs further education  HOME EXERCISE PROGRAM:  Access Code: [email or web address] URL: [email or web address] Date: 03/10/2024 Prepared by: Carlen Chasten  Exercises - Seated Hip Adduction Isometrics with Ball  - 1 x daily - 7 x weekly - 2 sets - 10 reps - 5 seconds hold - Supine Quadricep Sets  - 1 x daily - 7 x weekly - 2 sets - 10 reps - 3 seconds hold   GOALS: Goals reviewed with patient? Yes  SHORT TERM GOALS: Target date: 04/21/2024  Patient will be independent in home exercise program to improve strength/mobility for better functional independence with ADLs.  Baseline: initiated on 03/10/2024 Goal status: INITIAL   LONG TERM GOALS: Target date: 06/02/2024   Patient (< 55 years old) will complete five times sit to stand (5XSTS) test in < 10 seconds indicating an increased LE strength and improved balance.  Baseline: 03/15/2024: 16.53 seconds from standard green chair relying on R hand to push-up from armrest and pt's personal SBQC nearby, but not using it VERSUS 25.85 seconds with R hand across chest, with min A for balance/safety without use of UE support Goal status: INITIAL  2.  Patient will increase Berg Balance  score to > 45/56 to demonstrate improved balance and decreased fall risk during functional activities and ADLs.  Baseline: 03/15/2024: 14/56 Goal status: INITIAL  3.  Patient will increase 10 meter walk test to >1.70m/s using LRAD as to improve gait speed for better community ambulation and to reduce fall risk.  Baseline: 0.30 m/s using small based quad cane with CGA and 1x light min A due to balance instability  Goal status: INITIAL  4.  Patient will increase six minute walk test distance to >582ft for progression towards community ambulator and improve gait ability  Baseline: 239ft with SBQC  Goal status: INITIAL  5.  Patient will reduce timed up and go to <15 seconds to reduce fall risk and demonstrate improved transfer/gait ability. Baseline: 03/15/2024: 25.32 seconds using SBQC and chair with armrest as well as CGA for safety/steadying Goal status: INITIAL  6.  Patient will ascend/descend 4 stairs, using railing, independently without loss of balance to improve ability to get in/out of home.   Baseline: 03/31/2024: using R UE support on each HR with skilled min assist for balance using primarily step-to pattern Goal status: INITIAL  ASSESSMENT:  CLINICAL IMPRESSION:   Patient is a 48 y.o. male who was seen today for physical therapy treatment for L hemiplegia (UE>LE) with increased tone, impaired balance, impaired functional mobility, impaired gait, and increased fall risk following CVA in 2020. Mr. Bungert is highly motivated to participate in therapy and improve his ability to walk. As noted above, pt demonstrates significant impairments in L LE strength and ROM as well as increased tone and significant sensory deficits impacting his functional mobility. Patient reporting pain from AFO despite recent adjustments made by [facility], specifically when wearing his black Nike shoes. Therapist called [facility] and requested they reach out to pt to schedule a follow-up appointment and also  requested [facility] call therapist to discuss pt's potential bracing needs.Patient  continued participation in higher intensity gait training using +2 R HHA to decrease reliance on R UE support during L stance phase with pt advancing to gait training 2x 275ft! Pt continued to have positive response to 7lb AW resistance added to L LE during gait for increased neural recruitment. Patient with improving consistency of reciprocal stepping pattern! Pt continues to benefit from intermittent mirror feedback to improve upright/midline posture. Patient interested in using litegait harness in future sessions. Mr. Schmale will benefit from further skilled PT to improve these deficits in order to increase QOL and ease/safety with ADLs.    OBJECTIVE IMPAIRMENTS: Abnormal gait, cardiopulmonary status limiting activity, decreased activity tolerance, decreased balance, decreased coordination, decreased endurance, decreased knowledge of use of DME, decreased mobility, difficulty walking, decreased ROM, decreased strength, hypomobility, impaired flexibility, impaired sensation, impaired tone, impaired UE functional use, improper body mechanics, postural dysfunction, and pain.   ACTIVITY LIMITATIONS: carrying, lifting, bending, standing, squatting, sleeping, stairs, transfers, bed mobility, continence, bathing, toileting, dressing, reach over head, hygiene/grooming, locomotion level, and caring for others  PARTICIPATION LIMITATIONS: meal prep, cleaning, laundry, medication management, shopping, community activity, and yard work  PERSONAL FACTORS: Age, Past/current experiences, Time since onset of injury/illness/exacerbation, Transportation, and 3+ comorbidities: HTN, hx of kidney stones, depression are also affecting patient's functional outcome.   REHAB POTENTIAL: Good  CLINICAL DECISION MAKING: Evolving/moderate complexity  EVALUATION COMPLEXITY: Moderate  PLAN:  PT FREQUENCY: 1-2x/week  PT DURATION: 12 weeks  PLANNED  INTERVENTIONS: 97164- PT Re-evaluation, 97750- Physical Performance Testing, 97110-Therapeutic exercises, 97530- Therapeutic activity, [id number]- Neuromuscular re-education, 97535- Self Care, [id number]- Manual therapy, [id number]- Gait training, [phone]- Orthotic/Prosthetic subsequent, [phone]- Canalith repositioning, [phone]- Electrical stimulation (manual), Patient/Family education, Balance training, Stair training, Taping, Dry Needling, Joint mobilization, Joint manipulation, Vestibular training, Visual/preceptual remediation/compensation, DME instructions, Cryotherapy, Moist heat, and Biofeedback  PLAN FOR NEXT SESSION:  *Progress note* - call [facility] to discuss potential transition to hinged AFO  - high intensity gait training using +2 HHA vs litegait support vs Lofstrand crutch  - continue adding AW to L LE  - transition to litegait harness in upcoming visit - L LE stretching and weightbearing for tone management and to improve alignment during gait/standing  - bridges with R foot placed on unstable surface to promote increased L LE wt bearing - stair navigation for L LE NMR - progress HEP - follow-up on when pt's next apt is for botox     Carlen Chasten, PT, DPT, NCS, CSRS Physical Therapist - [facility]  [facility]  4:34 PM 04/12/24

## 2024-04-14 ENCOUNTER — Ambulatory Visit: Admitting: Physical Therapy

## 2024-04-14 DIAGNOSIS — R29818 Other symptoms and signs involving the nervous system: Secondary | ICD-10-CM | POA: Diagnosis not present

## 2024-04-14 DIAGNOSIS — R278 Other lack of coordination: Secondary | ICD-10-CM | POA: Diagnosis not present

## 2024-04-14 DIAGNOSIS — R2689 Other abnormalities of gait and mobility: Secondary | ICD-10-CM | POA: Diagnosis not present

## 2024-04-14 DIAGNOSIS — I69354 Hemiplegia and hemiparesis following cerebral infarction affecting left non-dominant side: Secondary | ICD-10-CM | POA: Diagnosis not present

## 2024-04-14 DIAGNOSIS — R2681 Unsteadiness on feet: Secondary | ICD-10-CM

## 2024-04-14 DIAGNOSIS — M6281 Muscle weakness (generalized): Secondary | ICD-10-CM | POA: Diagnosis not present

## 2024-04-14 NOTE — Therapy (Signed)
 OUTPATIENT PHYSICAL THERAPY NEURO TREATMENT  Physical Therapy Progress Note   Dates of reporting period  03/10/2024   to   04/14/2024    Patient Name: Matthew Black MRN: [id number] DOB:Jan 13, 1976, 48 y.o., male Today's Date: 04/14/2024   PCP: Sylvia Everts, PA-C REFERRING PROVIDER: Liam Redhead, MD  END OF SESSION:   PT End of Session - 04/14/24 1405     Visit Number 10    Number of Visits 24    Date for PT Re-Evaluation 06/02/24    Authorization Type [employer]ICARE    Authorization Time Period 03/10/24-06/02/24    PT Start Time 1405    PT Stop Time 1448    PT Time Calculation (min) 43 min    Equipment Utilized During Treatment Gait belt;Other (comment)   L LE AFO   Activity Tolerance Patient tolerated treatment well    Behavior During Therapy [facility] for tasks assessed/performed             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   Stroke (HCC)    RICA, R A1, R MCA occlusion 07/19/19   Past Surgical History:  Procedure Laterality Date   CRANIOPLASTY Right 06/05/2020   Procedure: CRANIOPLASTY;  Surgeon: Augusto Blonder, MD;  Location: [facility] OR;  Service: Neurosurgery;  Laterality: Right;  right   CRANIOTOMY Right 07/19/2019   Procedure: RIGHT HEMI-CRANIECTOMY With implantation of skull flap to abdominal wall;  Surgeon: Augusto Blonder, MD;  Location: [facility] OR;  Service: Neurosurgery;  Laterality: Right;   CYST EXCISION N/A 10/08/2016   Procedure: EXCISION OF POSTERIOR NECK CYST;  Surgeon: Adalberto Acton, MD;  Location: [facility] ORS;  Service: General;   Laterality: N/A;   CYSTOSCOPY/URETEROSCOPY/HOLMIUM LASER/STENT PLACEMENT Right 03/16/2020   Procedure: CYSTOSCOPY RIGHT RETROGRADE PYELOGRAM URETEROSCOPY/HOLMIUM LASER/STENT PLACEMENT;  Surgeon: Samson Croak, MD;  Location: [facility];  Service: Urology;  Laterality: Right;   INCISION AND DRAINAGE ABSCESS N/A 09/22/2014   Procedure: INCISION AND DRAINAGE ABSCESS POSTERIOR NECK;  Surgeon: Azucena Bollard, MD;  Location: [facility]S;  Service: General;  Laterality: N/A;   INCISION AND DRAINAGE ABSCESS N/A 12/20/2015   Procedure: INCISION AND DRAINAGE POSTERIOR NECK MASS;  Surgeon: Oralee Billow, MD;  Location: [facility];  Service: General;  Laterality: N/A;   INCISION AND DRAINAGE ABSCESS Left 07/10/2004   middle finger   IR IVC FILTER PLMT / S&I /IMG GUID/MOD SED  07/21/2019   IR RADIOLOGIST EVAL & MGMT  12/14/2019   IR RADIOLOGIST EVAL & MGMT  12/21/2020   IR VENOGRAM RENAL UNI RIGHT  07/21/2019   MASS EXCISION N/A 07/21/2017   Procedure: EXCISION OF BENIGN NECK LESION WITH LAYERED CLOSURE;  Surgeon: Alger Infield, MD;  Location: [facility];  Service: Plastics;  Laterality: N/A;   MASS EXCISION N/A 11/10/2017   Procedure: EXCISION BENIGN LESION OF THE NECK WITH LAYERED CLOSURE;  Surgeon: Alger Infield, MD;  Location: [facility];  Service: Plastics;  Laterality: N/A;   Patient Active Problem List   Diagnosis  Date Noted   Hx of adenomatous colonic polyps 06/01/2023   COVID-19 virus infection 04/21/2021   Left-sided weakness 04/21/2021   S/P craniotomy 06/05/2020   History of cranioplasty 06/05/2020   Peri-rectal abscess 02/14/2020   Abnormal CT scan, pelvis 02/14/2020   Pancytopenia (HCC) 12/02/2019   Nephrolithiasis 12/02/2019   Hydronephrosis with renal and ureteral calculus obstruction 12/02/2019   Rectal pain 12/02/2019   Hyperkalemia 12/02/2019   Reactive depression    Wound infection after surgery    Sleep disturbance    Dysphagia, post-stroke     Transaminitis    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   Cerebral abscess    Urinary tract infection without hematuria    Altered mental status    Primary hypercoagulable state (HCC)    Acute pulmonary embolism without acute cor pulmonale (HCC)    Deep vein thrombosis (DVT) of non-extremity vein    Hypokalemia    Acute blood loss anemia    Leukocytosis    Endotracheal tube present    Acute respiratory failure with hypoxemia (HCC)    Stroke (cerebrum) (HCC) 07/19/2019   Pressure injury of skin 07/19/2019   Acute CVA (cerebrovascular accident) (HCC)    Encephalopathy    Dysphagia    Acute encephalopathy    Essential hypertension    Obesity 03/12/2016   Scalp abscess 12/20/2015   Neck abscess 12/20/2015   Pilonidal cyst 02/08/2013    ONSET DATE: stroke in August 2020  REFERRING DIAG: I63.9 (ICD-10-CM) - Cerebrovascular accident (CVA), unspecified mechanism (HCC)   THERAPY DIAG:   Muscle weakness (generalized)  Other lack of coordination  Other abnormalities of gait and mobility  Unsteadiness on feet  Hemiplegia and hemiparesis following cerebral infarction affecting left non-dominant side (HCC)  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)  Other symptoms and signs involving the nervous system   Rationale for Evaluation and Treatment: Rehabilitation  SUBJECTIVE:                                                                                                                                                                                             SUBJECTIVE STATEMENT:  Pt reports he is doing "good." Reports he hasn't caught his L toe when walking since last visit. Denies falls. Pt reports [facility] called and he is going on June 4th at 3:00pm to have brace re-adjusted due to continued pain.  Pt reports he forgot to bring the pad on his ankle strap for therapist to assist him in putting back on.   Initial  Eval: Patient reports his goal is to walk  without using a cane and without assistance. Patient states he wants to walk like he did before having the stroke. Reports his stroke was in August of 2020. States he currently uses a quad cane for ambulation and tries to walk as much as possible. Reports he has a manual wheelchair, but states he tries not to use it because it depresses him and makes him feel like he is back to when he first had his stroke. Reports wearing L LE custom AFO at all times.   Reports he is currently doing everything mod-I, but states he could call his mom, dad, or brothers if he needed assistance.  Reports he has routine botox  injections in L UE to help manage tone, but doesn't receive any in his LE.  Reports he has a hospital bed, but sleeps in a regular bed.  Pt accompanied by: self  PERTINENT HISTORY: L hemiparesis s/p R ACA and MCA territory CVA in August 2020, HTN, hx of kidney stones, depression  Per Neurology MD note on 11/24/2023: 33-year-old African-American male with large right hemispheric infarct due to right internal carotid and middle cerebral artery occlusion with cytotoxic edema and brain herniation s/p hemicraniectomy in 06/2019 is doing reasonably well with residual left spastic hemiparesis and left hemianopia.  Etiology of carotid occlusion unclear possibly dissection.  He had a prolonged hospital admission with several complications including DVT, pulmonary embolism, hemorrhagic transformation, abdominal wall hematoma, UTI but made quite remarkable recovery   PAIN:  Are you having pain? No, denies pain today, but states sometimes will have L shoulder pain  PRECAUTIONS: Fall  RED FLAGS: None   WEIGHT BEARING RESTRICTIONS: No  FALLS: Has patient fallen in last 6 months? Yes, stating he fell down his stairs ~5 months ago and is now scared to perform stair navigation  LIVING ENVIRONMENT: Lives with: lives alone Lives in: House/apartment Stairs: Yes:  Internal: flight steps; can reach both and External: 4 steps; can reach both, but states he doesn't have to go upstairs Has following equipment at home: Quad cane small base, Hemi walker, Wheelchair (manual), shower chair, Shower bench, and L LE custom AFO Pt has custom K5 wheelchair with rigid, contoured back support and Roho cushion. Pt reports he calls [employer] to come adjust his seat cushion when he feels it is getting uncomfortable.  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  DIAGNOSTIC FINDINGS:  EXAM: CT ANGIOGRAPHY HEAD AND NECK WITH AND WITHOUT CONTRAST IMPRESSION: 1. No acute intracranial abnormality. 2. Chronically occluded right ICA with minimal opacification of the right MCA and ACA branches. 3. Redemonstrated extensive encephalomalacia in the right MCA and ACA territories secondary to a prior infarct.  Electronically Signed   By: Clora Dane M.D.   On: 11/27/2023 13:45  COGNITION: Overall cognitive status: Within functional limits for tasks assessed  VISION (03/29/2024): Therapist performed visual field screen with pt appearing to have L homonomous hemianopsia - pt confirms he has loss of vision on L side.   SENSATION: Light touch: Impaired in L LE Proprioception: Impaired  in L LE  Can feel deep pressure in L LE   COORDINATION: Impaired in  L LE due to paresis, hypertonia, and overall decreased flexibility  EDEMA:  Not formally assessed  MUSCLE TONE: LLE: Mild, Moderate, and Hypertonic  MUSCLE LENGTH: Not formally  assessed; however, demonstrates L hamstring muscle tightness  DTRs:  Not formally assessed  POSTURE: rounded shoulders, forward head, and weight shift right  LOWER EXTREMITY ROM:       Right Eval Active ROM Left Eval Passive ROM  Hip flexion WFL Achieves at least 90  Hip extension    Hip abduction    Hip adduction    Hip internal rotation  Unable to achieve neutral hip rotation, lacking internal rotation  Hip external rotation  [facility] and rests in excessive ER  Knee flexion [facility]  Knee extension WFL Able to achieve terminal knee extension in supine, but with significant discomfort due to hamstring tone  Ankle dorsiflexion WFL Did not remove AFO to assess this date  Ankle plantarflexion WFL Did not remove AFO to assess this date  Ankle inversion    Ankle eversion     (Blank rows = not tested)  LOWER EXTREMITY MMT:    MMT Right Eval Left Eval  Hip flexion 4+ 2-  (compensates with hip adductor activation)  Hip extension    Hip abduction    Hip adduction  3+  Hip internal rotation  2-  Hip external rotation  2  Knee flexion 4+ 1  Knee extension 4+ 3+  Ankle dorsiflexion 4+ Did not remove AFO to assess this date  Ankle plantarflexion 4+ Did not remove AFO to assess this date  Ankle inversion    Ankle eversion    (Blank rows = not tested)  Manual Muscle Test Scale 0/5 = No muscle contraction can be seen or felt 1/5 = Contraction can be felt, but there is no motion 2-/5 = Part moves through incomplete ROM w/ gravity decreased 2/5 = Part moves through complete ROM w/ gravity decreased 2+/5 = Part moves through incomplete ROM (<50%) against gravity or through complete ROM w/ gravity 3-/5 = Part moves through incomplete ROM (>50%) against gravity 3/5 = Part moves through complete ROM against gravity 3+/5 = Part moves through complete ROM against gravity/slight resistance 4-/5= Holds test position against slight to moderate pressure 4/5 = Part moves through complete ROM  against gravity/moderate resistance 4+/5= Holds test position against moderate to strong pressure 5/5 = Part moves through complete ROM against gravity/full resistance  BED MOBILITY: (sleeps in regular bed, but has hospital bed available) Findings: Sit to supine CGA Supine to sit Min A and Mod A Rolling to Right Mod A Rolling to Left Min A and Mod A  TRANSFERS: Sit to stand: CGA and Min A  Assistive device utilized: Counselling psychologist     Stand to sit: CGA and Min A  Assistive device utilized: Tree surgeon to chair: Min A  Assistive device utilized: Counselling psychologist       RAMP:  Not tested  CURB:  Not tested  STAIRS: Not tested *need to assess*  GAIT: Findings: advances L LE with excessive hip external rotation using hip adductors, step to pattern, decreased arm swing- Left, decreased step length- Right, decreased stance time- Left, decreased stride length, decreased hip/knee flexion- Left, decreased ankle dorsiflexion- Left, lateral lean- Right, and decreased trunk rotation Distance walked: ~37ft Assistive device utilized: small based quad cane in R UE  Level of assistance: CGA and Min A  FUNCTIONAL TESTS:  5 times sit to stand: need to assess Timed up and go (TUG): need to assess 6 minute walk test: need to assess 10 meter walk test: 0.3m/s using small based quad cane and CGA Berg Balance Scale: need to assess  PATIENT SURVEYS:  Stroke Impact Scale 03/15/2024: 49/80                                                                                                                               TREATMENT DATE: 04/14/2024  Gait training ~178ft x2 in/out therapy clinic using [facility] with close SBA for safety. Pt continues to demonstrate more consistent reciprocal stepping pattern; however, L LE remains excessively externally rotated (ER), decreased L stance time and slow, but improving gait speed.   Therapy session focused on re-assessment of standardized  outcome measures to determine pt's progress with therapy thus far.  Five times Sit to Stand Test (FTSS) "Stand up and sit down as quickly as possible 5 times, keeping your arms folded across your chest."    TIME: 8.96 seconds using R UE support to push-up from armrest and pt's SBQC next to him, 13.75 seconds with R hand across chest with min A for therapist to stabilize chair behind him  Times > 13.6 seconds is associated with increased disability and morbidity (Guralnik, 2000) Times > 15 seconds is predictive of recurrent falls in healthy individuals aged 87 and older (Buatois, et al., 2008) Normal performance values in community dwelling individuals aged 29 and older (Bohannon, 2006): 60-69 years: 11.4 seconds 70-79 years: 12.6 seconds 80-89 years: 14.8 seconds  MCID: >= 2.3 seconds for Vestibular Disorders (Meretta, 2006)   Participated in Timed Up and Go (TUG): 1st trial: 22.74 seconds 2nd trial: 21.76 seconds  Average: 22.25 seconds using SBQC & chair with armrest with close SBA for safety Patient demonstrates high fall risk as indicated by requiring >13.5seconds to complete the TUG.   HR 67bpm, SpO2 99%    10 Meter Walk Test: Patient instructed to walk 10 meters (32.8 ft) as quickly and as safely as possible at their normal speed Results: 0.405 m/s (25.54 seconds and 23.83 seconds = avg: 24.69 sec) using SBQC with close SBA for safety  Cut off scores:   Household Ambulator  < 0.4 m/s  Limited Community Ambulator  0.4 - 0.8 m/s  [employer]  > 0.8 m/s  Increased fall risk  < 1.73m/s  Crossing a Street  >1.64m/s  MCID 0.05 m/s (small), 0.13 m/s (moderate), 0.06 m/s (significant)  (ANPTA Core Set of Outcome Measures for Adults with Neurologic Conditions, 2018)     Patient participated in [place] Balance Test and demonstrates increased fall risk as noted by score of   22/56.  (<36= high risk for falls, close to 100%; 37-45 significant >80%; 46-51 moderate >50%;  52-55 lower >25%). Patient now able to participate in more test items; however, continues to have limited ability to place L foot flat on ground  due to tone in flexors. Due to this, pt with significant difficulty participating in some of the more challenging items.   [facility] PT Assessment - 04/14/24 0001       Berg Balance Test   Sit to Stand Able to stand  independently using hands    Standing Unsupported Able to stand 2 minutes with supervision    Sitting with Back Unsupported but Feet Supported on Floor or Stool Able to sit safely and securely 2 minutes    Stand to Sit Controls descent by using hands    Transfers Needs one person to assist   light min A for safety without SBQC   Standing Unsupported with Eyes Closed Able to stand 10 seconds with supervision    Standing Unsupported with Feet Together Needs help to attain position and unable to hold for 15 seconds   unable to truly place feet together due to tone in L LE causing foot to be partially drawn up off floor   From Standing, Reach Forward with Outstretched Arm Reaches forward but needs supervision    From Standing Position, Pick up Object from Floor Able to pick up shoe, needs supervision    From Standing Position, Turn to Look Behind Over each Shoulder Needs supervision when turning   VERY minimal turning   Turn 360 Degrees Needs assistance while turning    Standing Unsupported, Alternately Place Feet on Step/Stool Needs assistance to keep from falling or unable to try    Standing Unsupported, One Foot in [employer] balance while stepping or standing    Standing on One Leg Unable to try or needs assist to prevent fall    Total Score 22             Therapist educated pt on results of assessments today and his progress thus far with therapy.     PATIENT EDUCATION: Education details: Therapy POC, LTGs, HEP, importance of improving L LE flexibility, findings during assessment  Person educated: Patient Education method:  Explanation and Handouts Education comprehension: verbalized understanding and needs further education  HOME EXERCISE PROGRAM:  Access Code: [email or web address] URL: [email or web address] Date: 03/10/2024 Prepared by: Carlen Chasten  Exercises - Seated Hip Adduction Isometrics with Ball  - 1 x daily - 7 x weekly - 2 sets - 10 reps - 5 seconds hold - Supine Quadricep Sets  - 1 x daily - 7 x weekly - 2 sets - 10 reps - 3 seconds hold   GOALS: Goals reviewed with patient? Yes  SHORT TERM GOALS: Target date: 04/21/2024  Patient will be independent in home exercise program to improve strength/mobility for better functional independence with ADLs.  Baseline: initiated on 03/10/2024 04/14/2024: continued Goal status: IN PROGRESS   LONG TERM GOALS: Target date: 06/02/2024   Patient (< 8 years old) will complete five times sit to stand (5XSTS) test in < 10 seconds indicating an increased LE strength and improved balance.  Baseline: 03/15/2024: 16.53 seconds from standard green chair relying on R hand to push-up from armrest and pt's personal SBQC nearby, but not using it VERSUS 25.85 seconds with R hand across chest, with min A for balance/safety without use of UE support 04/14/2024: 8.96 seconds using R UE support to push-up from armrest and pt's SBQC next to him, 13.75 seconds with R hand across chest with min A for therapist to stabilize chair behind him Goal status: IN PROGRESS  2.  Patient will increase Berg Balance score to > 45/56 to demonstrate improved balance  and decreased fall risk during functional activities and ADLs.  Baseline: 03/15/2024: 14/56 04/14/2024: 22/56 Goal status: IN PROGRESS  3.  Patient will increase 10 meter walk test to >1.68m/s using LRAD as to improve gait speed for better community ambulation and to reduce fall risk.  Baseline: 0.30 m/s using small based quad cane with CGA and 1x light min A due to balance instability  04/14/2024: 0.405 m/s using SBQC with  close SBA for safety Goal status: IN PROGRESS  4.  Patient will increase six minute walk test distance to >520ft for progression towards community ambulator and improve gait ability  Baseline: 260ft with [facility]  04/14/2024: need to assess Goal status: IN PROGRESS  5.  Patient will reduce timed up and go to <15 seconds to reduce fall risk and demonstrate improved transfer/gait ability. Baseline: 03/15/2024: 25.32 seconds using SBQC and chair with armrest as well as CGA for safety/steadying 04/14/2024: 22.25 seconds using SBQC & chair w/ armrest and close SBA for safety Goal status: IN PROGRESS  6.  Patient will ascend/descend 4 stairs, using railing, independently without loss of balance to improve ability to get in/out of home.   Baseline: 03/31/2024: using R UE support on each HR with skilled min assist for balance using primarily step-to pattern 04/14/2024: need to assess Goal status: IN PROGRESS  ASSESSMENT:  CLINICAL IMPRESSION:   Patient is a 48 y.o. male who was seen today for physical therapy treatment for L hemiplegia (UE>LE) with increased tone, impaired balance, impaired functional mobility, impaired gait, and increased fall risk following CVA in 2020. Mr. Hagood is highly motivated to participate in therapy and improve his ability to walk. As noted above, pt demonstrates significant impairments in L LE strength and ROM as well as increased tone and significant sensory deficits impacting his functional mobility. Patient scheduled to follow-up with [facility] regarding pain from AFO on June 4th. Therapy session focused on re-assessment of standardized outcome measures to assess his progress with therapy thus far. Patient demonstrates improvements on all items assessed today demonstrating improved functional strength in LEs, improving gait speed, and improving standing balance. Patient is making good progress towards LTGs with greatest impairment continuing to be with L LE increased tone,  weakness, and impaired sensation impacting his ability to use that LE for balance support when standing. Mr. Bogosian will benefit from further skilled PT to improve these deficits in order to increase QOL and ease/safety with ADLs. Patient's condition has the potential to improve in response to therapy. Maximum improvement is yet to be obtained. The anticipated improvement is attainable and reasonable in a generally predictable time.    OBJECTIVE IMPAIRMENTS: Abnormal gait, cardiopulmonary status limiting activity, decreased activity tolerance, decreased balance, decreased coordination, decreased endurance, decreased knowledge of use of DME, decreased mobility, difficulty walking, decreased ROM, decreased strength, hypomobility, impaired flexibility, impaired sensation, impaired tone, impaired UE functional use, improper body mechanics, postural dysfunction, and pain.   ACTIVITY LIMITATIONS: carrying, lifting, bending, standing, squatting, sleeping, stairs, transfers, bed mobility, continence, bathing, toileting, dressing, reach over head, hygiene/grooming, locomotion level, and caring for others  PARTICIPATION LIMITATIONS: meal prep, cleaning, laundry, medication management, shopping, community activity, and yard work  PERSONAL FACTORS: Age, Past/current experiences, Time since onset of injury/illness/exacerbation, Transportation, and 3+ comorbidities: HTN, hx of kidney stones, depression are also affecting patient's functional outcome.   REHAB POTENTIAL: Good  CLINICAL DECISION MAKING: Evolving/moderate complexity  EVALUATION COMPLEXITY: Moderate  PLAN:  PT FREQUENCY: 1-2x/week  PT DURATION: 12 weeks  PLANNED INTERVENTIONS:  16109- PT Re-evaluation, 97750- Physical Performance Testing, 97110-Therapeutic exercises, 97530- Therapeutic activity, [id number]- Neuromuscular re-education, [phone]- Self Care, [id number]- Manual therapy, [phone]- Gait training, [phone]- Orthotic/Prosthetic subsequent, [phone]- Canalith  repositioning, [phone]- Electrical stimulation (manual), Patient/Family education, Balance training, Stair training, Taping, Dry Needling, Joint mobilization, Joint manipulation, Vestibular training, Visual/preceptual remediation/compensation, DME instructions, Cryotherapy, Moist heat, and Biofeedback  PLAN FOR NEXT SESSION:  Remaining OMs/LTGs to assess: stairs & - follow-up with [facility] regarding potential transition to hinged AFO   - pt has apt on June 4th - high intensity gait training using +2 HHA vs litegait support vs Lofstrand crutch  - continue adding AW to L LE  - transition to litegait harness in upcoming visit - L LE stretching and weightbearing for tone management and to improve alignment during gait/standing  - bridges with R foot placed on unstable surface to promote increased L LE wt bearing - stair navigation for L LE NMR - progress HEP - follow-up on when pt's next apt is for botox     Ladawn Boullion, PT, DPT, NCS, CSRS Physical Therapist - [facility]  [facility]  4:13 PM 04/14/24

## 2024-04-20 ENCOUNTER — Ambulatory Visit: Admitting: Physical Therapy

## 2024-04-20 DIAGNOSIS — R29818 Other symptoms and signs involving the nervous system: Secondary | ICD-10-CM

## 2024-04-20 DIAGNOSIS — M6281 Muscle weakness (generalized): Secondary | ICD-10-CM

## 2024-04-20 DIAGNOSIS — R2689 Other abnormalities of gait and mobility: Secondary | ICD-10-CM | POA: Diagnosis not present

## 2024-04-20 DIAGNOSIS — I69354 Hemiplegia and hemiparesis following cerebral infarction affecting left non-dominant side: Secondary | ICD-10-CM | POA: Diagnosis not present

## 2024-04-20 DIAGNOSIS — R278 Other lack of coordination: Secondary | ICD-10-CM

## 2024-04-20 DIAGNOSIS — R2681 Unsteadiness on feet: Secondary | ICD-10-CM

## 2024-04-20 NOTE — Therapy (Signed)
 OUTPATIENT PHYSICAL THERAPY NEURO TREATMENT   Patient Name: Matthew Black MRN: [id number] DOB:12/22/1975, 48 y.o., male Today's Date: 04/20/2024   PCP: Sylvia Everts, PA-C REFERRING PROVIDER: Liam Redhead, MD  END OF SESSION:   PT End of Session - 04/20/24 1450     Visit Number 11    Number of Visits 24    Date for PT Re-Evaluation 06/02/24    Authorization Type 4/17-7/10 for 24 PT visits    Authorization Time Period --    PT Start Time 1450    PT Stop Time 1530    PT Time Calculation (min) 40 min    Equipment Utilized During Treatment Gait belt;Other (comment)   L LE AFO   Activity Tolerance Patient tolerated treatment well    Behavior During Therapy [facility] for tasks assessed/performed              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   Stroke (HCC)    RICA, R A1, R MCA occlusion 07/19/19   Past Surgical History:  Procedure Laterality Date   CRANIOPLASTY Right 06/05/2020   Procedure: CRANIOPLASTY;  Surgeon: Augusto Blonder, MD;  Location: [facility] OR;  Service: Neurosurgery;  Laterality: Right;  right   CRANIOTOMY Right 07/19/2019   Procedure: RIGHT HEMI-CRANIECTOMY With implantation of skull flap to abdominal wall;  Surgeon: Augusto Blonder, MD;  Location[facility] OR;  Service: Neurosurgery;  Laterality: Right;   CYST EXCISION N/A 10/08/2016   Procedure: EXCISION OF POSTERIOR NECK CYST;  Surgeon: Adalberto Acton, MD;  Location: [facility] ORS;  Service: General;  Laterality: N/A;   CYSTOSCOPY/URETEROSCOPY/HOLMIUM LASER/STENT PLACEMENT Right 03/16/2020   Procedure:  CYSTOSCOPY RIGHT RETROGRADE PYELOGRAM URETEROSCOPY/HOLMIUM LASER/STENT PLACEMENT;  Surgeon: Samson Croak, MD;  Location: [facility] ORS;  Service: Urology;  Laterality: Right;   INCISION AND DRAINAGE ABSCESS N/A 09/22/2014   Procedure: INCISION AND DRAINAGE ABSCESS POSTERIOR NECK;  Surgeon: Azucena Bollard, MD;  Location: [facility] ORS;  Service: General;  Laterality: N/A;   INCISION AND DRAINAGE ABSCESS N/A 12/20/2015   Procedure: INCISION AND DRAINAGE POSTERIOR NECK MASS;  Surgeon: Oralee Billow, MD;  Location: [facility] ORS;  Service: General;  Laterality: N/A;   INCISION AND DRAINAGE ABSCESS Left 07/10/2004   middle finger   IR IVC FILTER PLMT / S&I /IMG GUID/MOD SED  07/21/2019   IR RADIOLOGIST EVAL & MGMT  12/14/2019   IR RADIOLOGIST EVAL & MGMT  12/21/2020   IR VENOGRAM RENAL UNI RIGHT  07/21/2019   MASS EXCISION N/A 07/21/2017   Procedure: EXCISION OF BENIGN NECK LESION WITH LAYERED CLOSURE;  Surgeon: Alger Infield, MD;  Location: [facility];  Service: Plastics;  Laterality: N/A;   MASS EXCISION N/A 11/10/2017   Procedure: EXCISION BENIGN LESION OF THE NECK WITH LAYERED CLOSURE;  Surgeon: Alger Infield, MD;  Location:  SURGERY CENTER;  Service: Plastics;  Laterality: N/A;   Patient Active Problem List   Diagnosis Date Noted   Hx of adenomatous colonic polyps 06/01/2023   COVID-19 virus infection 04/21/2021  Left-sided weakness 04/21/2021   S/P craniotomy 06/05/2020   History of cranioplasty 06/05/2020   Peri-rectal abscess 02/14/2020   Abnormal CT scan, pelvis 02/14/2020   Pancytopenia (HCC) 12/02/2019   Nephrolithiasis 12/02/2019   Hydronephrosis with renal and ureteral calculus obstruction 12/02/2019   Rectal pain 12/02/2019   Hyperkalemia 12/02/2019   Reactive depression    Wound infection after surgery    Sleep disturbance    Dysphagia, post-stroke    Transaminitis    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   Cerebral abscess    Urinary tract  infection without hematuria    Altered mental status    Primary hypercoagulable state (HCC)    Acute pulmonary embolism without acute cor pulmonale (HCC)    Deep vein thrombosis (DVT) of non-extremity vein    Hypokalemia    Acute blood loss anemia    Leukocytosis    Endotracheal tube present    Acute respiratory failure with hypoxemia (HCC)    Stroke (cerebrum) (HCC) 07/19/2019   Pressure injury of skin 07/19/2019   Acute CVA (cerebrovascular accident) (HCC)    Encephalopathy    Dysphagia    Acute encephalopathy    Essential hypertension    Obesity 03/12/2016   Scalp abscess 12/20/2015   Neck abscess 12/20/2015   Pilonidal cyst 02/08/2013    ONSET DATE: stroke in August 2020  REFERRING DIAG: I63.9 (ICD-10-CM) - Cerebrovascular accident (CVA), unspecified mechanism (HCC)   THERAPY DIAG:   Muscle weakness (generalized)  Other lack of coordination  Other abnormalities of gait and mobility  Unsteadiness on feet  Hemiplegia and hemiparesis following cerebral infarction affecting left non-dominant side (HCC)  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)  Other symptoms and signs involving the nervous system   Rationale for Evaluation and Treatment: Rehabilitation  SUBJECTIVE:                                                                                                                                                                                             SUBJECTIVE STATEMENT:  Pt reports he went to lunch with another member of the [facility] stroke support group and really enjoyed it. Pt reports he is doing good. Denies pain currently, but his AFO is still causing pain/discomfort primarily when wearing his black or grey sneakers (pt currently wearing his white sneakers).    Pt reports increased fatigue today due to it being cold and rainy outside.  [facility] on June 4th at 3:00pm  Pt reports he again forgot to  bring the pad on his ankle strap for therapist to assist  him in putting back on.  Initial Eval: Patient reports his goal is to walk without using a cane and without assistance. Patient states he wants to walk like he did before having the stroke. Reports his stroke was in August of 2020. States he currently uses a quad cane for ambulation and tries to walk as much as possible. Reports he has a manual wheelchair, but states he tries not to use it because it depresses him and makes him feel like he is back to when he first had his stroke. Reports wearing L LE custom AFO at all times.   Reports he is currently doing everything mod-I, but states he could call his mom, dad, or brothers if he needed assistance.  Reports he has routine botox  injections in L UE to help manage tone, but doesn't receive any in his LE.  Reports he has a hospital bed, but sleeps in a regular bed.  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  Etiology of carotid occlusion unclear possibly dissection.  He had a prolonged hospital admission with several complications including DVT, pulmonary embolism, hemorrhagic transformation, abdominal wall hematoma, UTI but made quite remarkable recovery   PAIN:  Are you having pain? No, denies pain today, but states sometimes will have L shoulder pain  PRECAUTIONS: Fall  RED FLAGS: None   WEIGHT BEARING RESTRICTIONS: No  FALLS: Has patient fallen in last 6 months? Yes, stating he fell down his stairs ~5 months ago and is now scared to perform stair  navigation  LIVING ENVIRONMENT: Lives with: lives alone Lives in: House/apartment Stairs: Yes: Internal: flight steps; can reach both and External: 4 steps; can reach both, but states he doesn't have to go upstairs Has following equipment at home: Quad cane small base, Hemi walker, Wheelchair (manual), shower chair, Shower bench, and L LE custom AFO Pt has custom K5 wheelchair with rigid, contoured back support and Roho cushion. Pt reports he calls [employer] to come adjust his seat cushion when he feels it is getting uncomfortable.  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  DIAGNOSTIC FINDINGS:  EXAM: CT ANGIOGRAPHY HEAD AND NECK WITH AND WITHOUT CONTRAST IMPRESSION: 1. No acute intracranial abnormality. 2. Chronically occluded right ICA with minimal opacification of the right MCA and ACA branches. 3. Redemonstrated extensive encephalomalacia in the right MCA and ACA territories secondary to a prior infarct.  Electronically Signed   By: Clora Dane M.D.   On: 11/27/2023 13:45  COGNITION: Overall cognitive status: Within functional limits for tasks assessed  VISION (03/29/2024): Therapist performed visual field screen with pt appearing to have L homonomous hemianopsia - pt confirms he has loss of vision on L side.   SENSATION: Light touch: Impaired in L LE Proprioception: Impaired  in  L LE  Can feel deep pressure in L LE   COORDINATION: Impaired in L LE due to paresis, hypertonia, and overall decreased flexibility  EDEMA:  Not  formally assessed  MUSCLE TONE: LLE: Mild, Moderate, and Hypertonic  MUSCLE LENGTH: Not formally assessed; however, demonstrates L hamstring muscle tightness  DTRs:  Not formally assessed  POSTURE: rounded shoulders, forward head, and weight shift right  LOWER EXTREMITY ROM:       Right Eval Active ROM Left Eval Passive ROM  Hip flexion WFL Achieves at least 90  Hip extension    Hip abduction    Hip adduction    Hip internal rotation  Unable to achieve neutral hip rotation, lacking internal rotation  Hip external rotation  [facility] and rests in excessive ER  Knee flexion [facility]  Knee extension WFL Able to achieve terminal knee extension in supine, but with significant discomfort due to hamstring tone  Ankle dorsiflexion WFL Did not remove AFO to assess this date  Ankle plantarflexion WFL Did not remove AFO to assess this date  Ankle inversion    Ankle eversion     (Blank rows = not tested)  LOWER EXTREMITY MMT:    MMT Right Eval Left Eval  Hip flexion 4+ 2-  (compensates with hip adductor activation)  Hip extension    Hip abduction    Hip adduction  3+  Hip internal rotation  2-  Hip external rotation  2  Knee flexion 4+ 1  Knee extension 4+ 3+  Ankle dorsiflexion 4+ Did not remove AFO to assess this date  Ankle plantarflexion 4+ Did not remove AFO to assess this date  Ankle inversion    Ankle eversion    (Blank rows = not tested)  Manual Muscle Test Scale 0/5 = No muscle contraction can be seen or felt 1/5 = Contraction can be felt, but there is no motion 2-/5 = Part moves through incomplete ROM w/ gravity decreased 2/5 = Part moves through complete ROM w/ gravity decreased 2+/5 = Part moves through incomplete ROM (<50%) against gravity or through complete ROM w/ gravity 3-/5 = Part moves through incomplete ROM (>50%) against gravity 3/5 = Part moves through complete ROM against gravity 3+/5 = Part moves through complete ROM against gravity/slight  resistance 4-/5= Holds test position against slight to moderate pressure 4/5 = Part moves through complete ROM against gravity/moderate resistance 4+/5= Holds test position against moderate to strong pressure 5/5 = Part moves through complete ROM against gravity/full resistance  BED MOBILITY: (sleeps in regular bed, but has hospital bed available) Findings: Sit to supine CGA Supine to sit Min A and Mod A Rolling to Right Mod A Rolling to Left Min A and Mod A  TRANSFERS: Sit to stand: CGA and Min A  Assistive device utilized: Counselling psychologist     Stand to sit: CGA and Min A  Assistive device utilized: Tree surgeon to chair: Min A  Assistive device utilized: Counselling psychologist       RAMP:  Not tested  CURB:  Not tested  STAIRS: Not tested *need to assess*  GAIT: Findings: advances L LE with excessive hip external rotation using hip adductors, step to pattern, decreased arm swing- Left, decreased step length- Right, decreased stance time- Left, decreased stride length, decreased hip/knee flexion- Left, decreased ankle dorsiflexion- Left, lateral lean- Right, and decreased trunk rotation Distance walked: ~29ft Assistive device utilized: small based quad cane  in R UE  Level of assistance: CGA and Min A  FUNCTIONAL TESTS:  5 times sit to stand: need to assess Timed up and go (TUG): need to assess 6 minute walk test: need to assess 10 meter walk test: 0.33m/s using small based quad cane and CGA Berg Balance Scale: need to assess  PATIENT SURVEYS:  Stroke Impact Scale 03/15/2024: 49/80                                                                                                                               TREATMENT DATE: 04/20/2024  Gait training ~17ft  into therapy clinic using [facility] with close SBA for safety. Pt continues to demonstrate more consistent reciprocal stepping pattern; however, L LE remains excessively externally rotated (ER), decreased L  stance time and slow, but improving gait speed.   6 Min Walk Test:  Instructed patient to ambulate as quickly and as safely as possible for 6 minutes using LRAD. Patient was allowed to take standing rest breaks without stopping the test, but if the patient required a sitting rest break the clock would be stopped and the test would be over.  Results: 294 feet (89 meters, Avg speed 0.247 m/s) using a SBQC with SBA. Results indicate that the patient has reduced endurance with ambulation compared to age matched norms.  Age Matched Norms: 84-69 yo M: 50 F: 47, 66-79 yo M: 15 F: 471, 62-89 yo M: 417 F: 392 MDC: 58.21 meters (190.98 feet) or 50 meters (ANPTA Core Set of Outcome Measures for Adults with Neurologic Conditions, 2018)  Vitals after: HR 82bpm and SpO2 100%  Pt requires prolonged seated rest break following this test due to increased fatigue today.   Stair navigation training ascending/descending 4 steps (6" height) using  RUE support on each HR with CGA for safety - step-to pattern leading with R LE on ascent and pt having to use compensatory circumduction to bring L LE up onto step - step-to pattern on descent leading with L LE. Relies on railing throughout for balance.  Pt in agreement with use of litegait harness to allow progressing of dynamic gait training with safe set-up.   Gait training ~89ft using litegait harness for safety but not providing true BWS (body weight support) with therapist providing max A to drive/control litegait.  Started with R UE support on litegait handle, progressed to no UE support Therapist cuing and facilitating for increased L wt shift during L stance to promote longer stance time and allow longer R LE step length This will occasionally cause pt's L toes to get caught when initiating swing after taking longer R step length due to rigid AFO L LE continues to remain excessively externally rotated throughout gait  Pt reports really enjoying gait training  with use of litegait   PATIENT EDUCATION: Education details: Therapy POC, LTGs, HEP, importance of improving L LE flexibility, findings during assessment  Person educated: Patient Education method: Explanation  and Handouts Education comprehension: verbalized understanding and needs further education  HOME EXERCISE PROGRAM:  Access Code: [email or web address] URL: [email or web address] Date: 03/10/2024 Prepared by: Carlen Chasten  Exercises - Seated Hip Adduction Isometrics with Ball  - 1 x daily - 7 x weekly - 2 sets - 10 reps - 5 seconds hold - Supine Quadricep Sets  - 1 x daily - 7 x weekly - 2 sets - 10 reps - 3 seconds hold   GOALS: Goals reviewed with patient? Yes  SHORT TERM GOALS: Target date: 04/21/2024  Patient will be independent in home exercise program to improve strength/mobility for better functional independence with ADLs.  Baseline: initiated on 03/10/2024 04/14/2024: continued Goal status: IN PROGRESS   LONG TERM GOALS: Target date: 06/02/2024   Patient (< 15 years old) will complete five times sit to stand (5XSTS) test in < 10 seconds indicating an increased LE strength and improved balance.  Baseline: 03/15/2024: 16.53 seconds from standard green chair relying on R hand to push-up from armrest and pt's personal SBQC nearby, but not using it VERSUS 25.85 seconds with R hand across chest, with min A for balance/safety without use of UE support 04/14/2024: 8.96 seconds using R UE support to push-up from armrest and pt's SBQC next to him, 13.75 seconds with R hand across chest with min A for therapist to stabilize chair behind him Goal status: IN PROGRESS  2.  Patient will increase Berg Balance score to > 45/56 to demonstrate improved balance and decreased fall risk during functional activities and ADLs.  Baseline: 03/15/2024: 14/56 04/14/2024: 22/56 Goal status: IN PROGRESS  3.  Patient will increase 10 meter walk test to >1.62m/s using LRAD as to improve gait speed  for better community ambulation and to reduce fall risk.  Baseline: 0.30 m/s using small based quad cane with CGA and 1x light min A due to balance instability  04/14/2024: 0.405 m/s using SBQC with close SBA for safety Goal status: IN PROGRESS  4.  Patient will increase six minute walk test distance to >5109ft for progression towards community ambulator and improve gait ability  Baseline: 268ft with [facility]  04/20/2024:  279ft with SBQC and SBA Goal status: IN PROGRESS  5.  Patient will reduce timed up and go to <15 seconds to reduce fall risk and demonstrate improved transfer/gait ability. Baseline: 03/15/2024: 25.32 seconds using SBQC and chair with armrest as well as CGA for safety/steadying 04/14/2024: 22.25 seconds using SBQC & chair w/ armrest and close SBA for safety Goal status: IN PROGRESS  6.  Patient will ascend/descend 4 stairs, using railing, independently without loss of balance to improve ability to get in/out of home.   Baseline: 03/31/2024: using R UE support on each HR with skilled min assist for balance using primarily step-to pattern 04/20/2024:  using R UE support on each HR with skilled CGA for safety using step-to pattern Goal status: IN PROGRESS  ASSESSMENT:  CLINICAL IMPRESSION:   Patient is a 48 y.o. male who was seen today for physical therapy treatment for L hemiplegia (UE>LE) with increased tone, impaired balance, impaired functional mobility, impaired gait, and increased fall risk following CVA in 2020. Mr. Schmieder is highly motivated to participate in therapy and improve his ability to walk. As noted above, pt demonstrates significant impairments in L LE strength and ROM as well as increased tone and significant sensory deficits impacting his functional mobility. Patient scheduled to [facility] with [facility] pain from AFO on June 4th. Therapy session focused on  re-assessment of and stair navigation to assess his progress with therapy thus far. Patient  demonstrates improvement in gait endurance by ambulating 36ft further, despite pt reporting increased fatigue today. Pt also demos increasing independence with stair navigation only requiring CGA today. Therapist also introduced use of litegait harness to allow pt increased participation in higher level dynamic gait interventions while keeping him safe and pt reports really enjoying it. Mr. Gandolfo will benefit from further skilled PT to improve these deficits in order to increase QOL and ease/safety with ADLs.    OBJECTIVE IMPAIRMENTS: Abnormal gait, cardiopulmonary status limiting activity, decreased activity tolerance, decreased balance, decreased coordination, decreased endurance, decreased knowledge of use of DME, decreased mobility, difficulty walking, decreased ROM, decreased strength, hypomobility, impaired flexibility, impaired sensation, impaired tone, impaired UE functional use, improper body mechanics, postural dysfunction, and pain.   ACTIVITY LIMITATIONS: carrying, lifting, bending, standing, squatting, sleeping, stairs, transfers, bed mobility, continence, bathing, toileting, dressing, reach over head, hygiene/grooming, locomotion level, and caring for others  PARTICIPATION LIMITATIONS: meal prep, cleaning, laundry, medication management, shopping, community activity, and yard work  PERSONAL FACTORS: Age, Past/current experiences, Time since onset of injury/illness/exacerbation, Transportation, and 3+ comorbidities: HTN, hx of kidney stones, depression are also affecting patient's functional outcome.   REHAB POTENTIAL: Good  CLINICAL DECISION MAKING: Evolving/moderate complexity  EVALUATION COMPLEXITY: Moderate  PLAN:  PT FREQUENCY: 1-2x/week  PT DURATION: 12 weeks  PLANNED INTERVENTIONS: 97164- PT Re-evaluation, 97750- Physical Performance Testing, 97110-Therapeutic exercises, 97530- Therapeutic activity, [id number]- Neuromuscular re-education, 97535- Self Care, 16109- Manual therapy,  [id number]- Gait training, [phone]- Orthotic/Prosthetic subsequent, [phone]- Canalith repositioning, [phone]- Electrical stimulation (manual), Patient/Family education, Balance training, Stair training, Taping, Dry Needling, Joint mobilization, Joint manipulation, Vestibular training, Visual/preceptual remediation/compensation, DME instructions, Cryotherapy, Moist heat, and Biofeedback  PLAN FOR NEXT SESSION:  - follow-up with [facility] regarding potential transition to hinged AFO   - pt has apt on June 4th - high intensity gait training using +2 HHA vs litegait support vs Lofstrand crutch  - continue adding AW to L LE  - continue use of litegait harness in upcoming visits - L LE stretching and weightbearing for tone management and to improve alignment during gait/standing  - bridges with R foot placed on unstable surface to promote increased L LE wt bearing - stair navigation for L LE NMR - progress HEP - follow-up on when pt's next apt is for botox     Carlen Chasten, PT, DPT, NCS, CSRS Physical Therapist - [facility]  [facility]  5:38 PM 04/20/24

## 2024-04-25 ENCOUNTER — Ambulatory Visit: Attending: Physical Medicine and Rehabilitation | Admitting: Physical Therapy

## 2024-04-25 ENCOUNTER — Telehealth: Payer: Self-pay | Admitting: Physical Therapy

## 2024-04-25 ENCOUNTER — Other Ambulatory Visit: Payer: Self-pay

## 2024-04-25 DIAGNOSIS — I10 Essential (primary) hypertension: Secondary | ICD-10-CM

## 2024-04-25 MED ORDER — METOPROLOL TARTRATE 50 MG PO TABS
50.0000 mg | ORAL_TABLET | Freq: Two times a day (BID) | ORAL | 11 refills | Status: DC
Start: 2024-04-25 — End: 2024-09-19

## 2024-04-25 NOTE — Therapy (Incomplete)
 OUTPATIENT PHYSICAL THERAPY NEURO TREATMENT   Patient Name: Matthew Black MRN: [id number] DOB:[birth year], 48 y.o., male Today's Date: 04/25/2024   PCP: Sylvia Everts, PA-C REFERRING PROVIDER: Liam Redhead, MD  END OF SESSION: ***     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   Stroke (HCC)    RICA, R A1, R MCA occlusion 07/19/19   Past Surgical History:  Procedure Laterality Date   CRANIOPLASTY Right 06/05/2020   Procedure: CRANIOPLASTY;  Surgeon: Augusto Blonder, MD;  Location: [facility] OR;  Service: Neurosurgery;  Laterality: Right;  right   CRANIOTOMY Right 07/19/2019   Procedure: RIGHT HEMI-CRANIECTOMY With implantation of skull flap to abdominal wall;  Surgeon: Augusto Blonder, MD;  Location: [facility] OR;  Service: Neurosurgery;  Laterality: Right;   CYST EXCISION N/A 10/08/2016   Procedure: EXCISION OF POSTERIOR NECK CYST;  Surgeon: Adalberto Acton, MD;  Location: [facility] ORS;  Service: General;  Laterality: N/A;   CYSTOSCOPY/URETEROSCOPY/HOLMIUM LASER/STENT PLACEMENT Right 03/16/2020   Procedure: CYSTOSCOPY RIGHT RETROGRADE PYELOGRAM URETEROSCOPY/HOLMIUM LASER/STENT PLACEMENT;  Surgeon: Samson Croak, MD;  Location: [facility];  Service: Urology;  Laterality: Right;   INCISION AND DRAINAGE ABSCESS N/A 09/22/2014   Procedure: INCISION AND DRAINAGE ABSCESS POSTERIOR NECK;  Surgeon: Azucena Bollard, MD;  Location: [facility];  Service: General;  Laterality: N/A;   INCISION AND DRAINAGE ABSCESS N/A 12/20/2015   Procedure: INCISION AND DRAINAGE POSTERIOR NECK MASS;  Surgeon: Oralee Billow, MD;  Location: [facility];  Service: General;  Laterality: N/A;   INCISION AND DRAINAGE ABSCESS Left 07/10/2004   middle finger   IR IVC FILTER PLMT / S&I /IMG GUID/MOD SED  07/21/2019   IR RADIOLOGIST EVAL & MGMT  12/14/2019   IR RADIOLOGIST EVAL & MGMT  12/21/2020   IR VENOGRAM RENAL UNI RIGHT  07/21/2019   MASS EXCISION N/A 07/21/2017   Procedure: EXCISION OF BENIGN NECK LESION WITH LAYERED CLOSURE;  Surgeon: Alger Infield, MD;  Location: [facility];  Service: Plastics;  Laterality: N/A;   MASS EXCISION N/A 11/10/2017   Procedure: EXCISION BENIGN LESION OF THE NECK WITH LAYERED CLOSURE;  Surgeon: Alger Infield, MD;  Location: [facility];  Service: Plastics;  Laterality: N/A;   Patient Active Problem List   Diagnosis Date Noted   Hx of adenomatous colonic polyps 06/01/2023   COVID-19 virus infection 04/21/2021   Left-sided weakness 04/21/2021   S/P craniotomy 06/05/2020   History of cranioplasty 06/05/2020   Peri-rectal abscess 02/14/2020   Abnormal CT scan, pelvis 02/14/2020   Pancytopenia (HCC) 12/02/2019   Nephrolithiasis 12/02/2019   Hydronephrosis with renal and ureteral calculus obstruction 12/02/2019   Rectal pain 12/02/2019   Hyperkalemia 12/02/2019   Reactive depression    Wound infection after surgery    Sleep disturbance    Dysphagia, post-stroke    Transaminitis    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   Cerebral abscess    Urinary tract infection without hematuria    Altered mental status    Primary  hypercoagulable state (HCC)    Acute pulmonary embolism without acute cor pulmonale (HCC)    Deep vein thrombosis (DVT) of non-extremity vein    Hypokalemia    Acute blood loss anemia    Leukocytosis    Endotracheal tube present    Acute respiratory failure with hypoxemia (HCC)    Stroke (cerebrum) (HCC) 07/19/2019   Pressure injury of skin 07/19/2019   Acute CVA (cerebrovascular accident) [facility])     Encephalopathy    Dysphagia    Acute encephalopathy    Essential hypertension    Obesity 03/12/2016   Scalp abscess 12/20/2015   Neck abscess 12/20/2015   Pilonidal cyst 02/08/2013    ONSET DATE: stroke in August 2020  REFERRING DIAG: I63.9 (ICD-10-CM) - Cerebrovascular accident (CVA), unspecified mechanism (HCC)   THERAPY DIAG:  *** No diagnosis found.   Rationale for Evaluation and Treatment: Rehabilitation  SUBJECTIVE:                                                                                                                                                                                             SUBJECTIVE STATEMENT:  ***   Pt reports he went to lunch with another member of the [facility] stroke support group and really enjoyed it. Pt reports he is doing good. Denies pain currently, but his AFO is still causing pain/discomfort primarily when wearing his black or grey sneakers (pt currently wearing his white sneakers).    Pt reports increased fatigue today due to it being cold and rainy outside.  [facility] on June 4th at 3:00pm  Pt reports he again forgot to bring the pad on his ankle strap for therapist to assist him in putting back on.  ***  Initial Eval: Patient reports his goal is to walk without using a cane and without assistance. Patient states he wants to walk like he did before having the stroke. Reports his stroke was in August of 2020. States he currently uses a quad cane for ambulation and tries to walk as much as possible. Reports he has a manual wheelchair, but states he tries not to use it because it depresses him and makes him feel like he is back to when he first had his stroke. Reports wearing L LE custom AFO at all times.   Reports he is currently doing everything mod-I, but states he could call his mom, dad, or brothers if he needed assistance.  Reports he has routine botox  injections in L UE to help manage tone, but doesn't receive any  in his LE.  Reports he has a hospital bed, but sleeps in a  regular bed.  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  Etiology of carotid occlusion unclear possibly dissection.  He had a prolonged hospital admission with several complications including DVT, pulmonary embolism, hemorrhagic transformation, abdominal wall hematoma, UTI but made quite remarkable recovery   PAIN:  Are you having pain? No, denies pain today, but states sometimes will have L shoulder pain  PRECAUTIONS: Fall  RED FLAGS: None   WEIGHT BEARING RESTRICTIONS: No  FALLS: Has patient fallen in last 6 months? Yes, stating he fell down his stairs ~5 months ago and is now scared to perform stair navigation  LIVING ENVIRONMENT: Lives with: lives alone Lives in: House/apartment Stairs: Yes: Internal: flight steps; can reach both and External: 4 steps; can reach both, but states he doesn't have to go upstairs Has following equipment at home: Quad cane small base, Hemi walker, Wheelchair (manual), shower chair, Shower bench, and L LE custom AFO Pt has custom K5 wheelchair with rigid, contoured back support and Roho cushion. Pt reports he calls [employer] to come adjust his seat cushion when he feels it is getting uncomfortable.  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  DIAGNOSTIC FINDINGS:  EXAM: CT ANGIOGRAPHY HEAD AND NECK WITH AND WITHOUT CONTRAST IMPRESSION: 1. No acute intracranial abnormality. 2. Chronically occluded right ICA with minimal opacification of the right MCA and ACA branches. 3. Redemonstrated extensive encephalomalacia in the right MCA and ACA territories secondary to a prior infarct.  Electronically Signed   By: Clora Dane M.D.   On: 11/27/2023 13:45  COGNITION: Overall cognitive status: Within functional limits for tasks assessed  VISION (03/29/2024): Therapist performed visual field screen with pt appearing to have L homonomous hemianopsia - pt confirms he has loss of vision on L side.   SENSATION: Light touch: Impaired in L LE Proprioception: Impaired  in L LE  Can feel deep pressure in L LE   COORDINATION: Impaired in L LE due to paresis, hypertonia, and overall decreased flexibility  EDEMA:  Not formally assessed  MUSCLE TONE: LLE: Mild, Moderate, and Hypertonic  MUSCLE LENGTH: Not formally assessed; however, demonstrates L hamstring muscle tightness  DTRs:  Not formally assessed  POSTURE: rounded shoulders, forward head, and weight shift right  LOWER EXTREMITY ROM:       Right Eval Active ROM Left Eval Passive ROM  Hip flexion WFL Achieves at least 90  Hip extension    Hip abduction    Hip adduction    Hip internal rotation  Unable to achieve neutral hip rotation, lacking internal rotation  Hip external rotation  [facility] and rests in excessive ER  Knee flexion [facility]  Knee extension WFL Able to achieve terminal knee extension in supine, but with significant discomfort due to hamstring  tone  Ankle dorsiflexion WFL Did not remove AFO to assess this date  Ankle plantarflexion WFL  Did not remove AFO to assess this date  Ankle inversion    Ankle eversion     (Blank rows = not tested)  LOWER EXTREMITY MMT:    MMT Right Eval Left Eval  Hip flexion 4+ 2-  (compensates with hip adductor activation)  Hip extension    Hip abduction    Hip adduction  3+  Hip internal rotation  2-  Hip external rotation  2  Knee flexion 4+ 1  Knee extension 4+ 3+  Ankle dorsiflexion 4+ Did not remove AFO to assess this date  Ankle plantarflexion 4+ Did not remove AFO to assess this date  Ankle inversion    Ankle eversion    (Blank rows = not tested)  Manual Muscle Test Scale 0/5 = No muscle contraction can be seen or felt 1/5 = Contraction can be felt, but there is no motion 2-/5 = Part moves through incomplete ROM w/ gravity decreased 2/5 = Part moves through complete ROM w/ gravity decreased 2+/5 = Part moves through incomplete ROM (<50%) against gravity or through complete ROM w/ gravity 3-/5 = Part moves through incomplete ROM (>50%) against gravity 3/5 = Part moves through complete ROM against gravity 3+/5 = Part moves through complete ROM against gravity/slight resistance 4-/5= Holds test position against slight to moderate pressure 4/5 = Part moves through complete ROM against gravity/moderate resistance 4+/5= Holds test position against moderate to strong pressure 5/5 = Part moves through complete ROM against gravity/full resistance  BED MOBILITY: (sleeps in regular bed, but has hospital bed available) Findings: Sit to supine CGA Supine to sit Min A and Mod A Rolling to Right Mod A Rolling to Left Min A and Mod A  TRANSFERS: Sit to stand: CGA and Min A  Assistive device utilized: Counselling psychologist     Stand to sit: CGA and Min A  Assistive device utilized: Tree surgeon to chair: Min A  Assistive device utilized: Counselling psychologist       RAMP:  Not tested  CURB:  Not tested  STAIRS: Not tested *need to assess*  GAIT: Findings:  advances L LE with excessive hip external rotation using hip adductors, step to pattern, decreased arm swing- Left, decreased step length- Right, decreased stance time- Left, decreased stride length, decreased hip/knee flexion- Left, decreased ankle dorsiflexion- Left, lateral lean- Right, and decreased trunk rotation Distance walked: ~75ft Assistive device utilized: small based quad cane in R UE  Level of assistance: CGA and Min A  FUNCTIONAL TESTS:  5 times sit to stand: need to assess Timed up and go (TUG): need to assess 6 minute walk test: need to assess 10 meter walk test: 0.56m/s using small based quad cane and CGA Berg Balance Scale: need to assess  PATIENT SURVEYS:  Stroke Impact Scale 03/15/2024: 49/80  TREATMENT DATE: 04/25/2024  ***   Gait training ~150ft  into therapy clinic using [facility] with close SBA for safety. Pt continues to demonstrate more consistent reciprocal stepping pattern; however, L LE remains excessively externally rotated (ER), decreased L stance time and slow, but improving gait speed.    Stair navigation training ascending/descending 4 steps (6" height) using  RUE support on each HR with CGA for safety - step-to pattern leading with R LE on ascent and pt having to use compensatory circumduction to bring L LE up onto step - step-to pattern on descent leading with L LE. Relies on railing throughout for balance.  Pt in agreement with use of litegait harness to allow progressing of dynamic gait training with safe set-up.   Gait training ~81ft using litegait harness for safety but not providing true BWS (body weight support) with therapist providing max A to drive/control litegait.  Started with R UE support on litegait handle, progressed to no UE support Therapist cuing and facilitating for increased L wt shift during L stance to promote  longer stance time and allow longer R LE step length This will occasionally cause pt's L toes to get caught when initiating swing after taking longer R step length due to rigid AFO L LE continues to remain excessively externally rotated throughout gait  Pt reports really enjoying gait training with use of litegait   PATIENT EDUCATION: Education details: Therapy POC, LTGs, HEP, importance of improving L LE flexibility, findings during assessment  Person educated: Patient Education method: Explanation and Handouts Education comprehension: verbalized understanding and needs further education  HOME EXERCISE PROGRAM:  Access Code: [email or web address] URL: [email or web address] Date: 03/10/2024 Prepared by: Carlen Chasten  Exercises - Seated Hip Adduction Isometrics with Ball  - 1 x daily - 7 x weekly - 2 sets - 10 reps - 5 seconds hold - Supine Quadricep Sets  - 1 x daily - 7 x weekly - 2 sets - 10 reps - 3 seconds hold   GOALS: Goals reviewed with patient? Yes  SHORT TERM GOALS: Target date: 04/21/2024  Patient will be independent in home exercise program to improve strength/mobility for better functional independence with ADLs.  Baseline: initiated on 03/10/2024 04/14/2024: continued Goal status: IN PROGRESS   LONG TERM GOALS: Target date: 06/02/2024   Patient (< 80 years old) will complete five times sit to stand (5XSTS) test in < 10 seconds indicating an increased LE strength and improved balance.  Baseline: 03/15/2024: 16.53 seconds from standard green chair relying on R hand to push-up from armrest and pt's personal SBQC nearby, but not using it VERSUS 25.85 seconds with R hand across chest, with min A for balance/safety without use of UE support 04/14/2024: 8.96 seconds using R UE support to push-up from armrest and pt's SBQC next to him, 13.75 seconds with R hand across chest with min A for therapist to stabilize chair behind him Goal status: IN PROGRESS  2.  Patient will  increase Berg Balance score to > 45/56 to demonstrate improved balance and decreased fall risk during functional activities and ADLs.  Baseline: 03/15/2024: 14/56 04/14/2024: 22/56 Goal status: IN PROGRESS  3.  Patient will increase 10 meter walk test to >1.89m/s using LRAD as to improve gait speed for better community ambulation and to reduce fall risk.  Baseline: 0.30 m/s using small based quad cane with CGA and 1x light min A due to balance instability  04/14/2024: 0.405 m/s using SBQC with close SBA for safety Goal status: IN PROGRESS  4.  Patient will increase six minute walk test distance to >522ft for progression towards community ambulator and improve gait ability  Baseline: 291ft with [facility]  04/20/2024:  264ft with SBQC and SBA Goal status: IN PROGRESS  5.  Patient will reduce timed up and go to <15 seconds to reduce fall risk and demonstrate improved transfer/gait ability. Baseline: 03/15/2024: 25.32 seconds using SBQC and chair with armrest as well as CGA for safety/steadying 04/14/2024: 22.25 seconds using SBQC & chair w/ armrest and close SBA for safety Goal status: IN PROGRESS  6.  Patient will ascend/descend 4 stairs, using railing, independently without loss of balance to improve ability to get in/out of home.   Baseline: 03/31/2024: using R UE support on each HR with skilled min assist for balance using primarily step-to pattern 04/20/2024:  using R UE support on each HR with skilled CGA for safety using step-to pattern Goal status: IN PROGRESS  ASSESSMENT:  CLINICAL IMPRESSION:  *** Patient is a 48 y.o. male who was seen today for physical therapy treatment for L hemiplegia (UE>LE) with increased tone, impaired balance, impaired functional mobility, impaired gait, and increased fall risk following CVA in 2020. Mr. Tardif is highly motivated to participate in therapy and improve his ability to walk. As noted above, pt demonstrates significant impairments in L LE strength and ROM as  well as increased tone and significant sensory deficits impacting his functional mobility. Patient scheduled to follow-up with [facility] regarding pain from AFO on June 4th. Therapy session focused on re-assessment of and stair navigation to assess his progress with therapy thus far. Patient demonstrates improvement in gait endurance by ambulating 15ft further, despite pt reporting increased fatigue today. Pt also demos increasing independence with stair navigation only requiring CGA today. Therapist also introduced use of litegait harness to allow pt increased participation in higher level dynamic gait interventions while keeping him safe and pt reports really enjoying it. Mr. Allnutt will benefit from further skilled PT to improve these deficits in order to increase QOL and ease/safety with ADLs.    OBJECTIVE IMPAIRMENTS: Abnormal gait, cardiopulmonary status limiting activity, decreased activity tolerance, decreased balance, decreased coordination, decreased endurance, decreased knowledge of use of DME, decreased mobility, difficulty walking, decreased ROM, decreased strength, hypomobility, impaired flexibility, impaired sensation, impaired tone, impaired UE functional use, improper body mechanics, postural dysfunction, and pain.   ACTIVITY LIMITATIONS: carrying, lifting, bending, standing, squatting, sleeping, stairs, transfers, bed mobility, continence, bathing, toileting, dressing, reach over head, hygiene/grooming, locomotion level, and caring for others  PARTICIPATION LIMITATIONS: meal prep, cleaning, laundry, medication management, shopping, community activity, and yard work  PERSONAL FACTORS: Age, Past/current experiences, Time since onset of injury/illness/exacerbation, Transportation, and 3+ comorbidities: HTN, hx of kidney stones, depression are also affecting patient's functional outcome.   REHAB POTENTIAL: Good  CLINICAL DECISION MAKING: Evolving/moderate complexity  EVALUATION  COMPLEXITY: Moderate  PLAN:  PT FREQUENCY: 1-2x/week  PT DURATION: 12 weeks  PLANNED INTERVENTIONS: 97164- PT Re-evaluation, 97750- Physical Performance Testing, 97110-Therapeutic exercises, 97530- Therapeutic activity, [id number]- Neuromuscular re-education, 97535- Self Care, [id number]- Manual therapy, [id number]- Gait training, [phone]- Orthotic/Prosthetic subsequent, [phone]- Canalith repositioning, [phone]- Electrical stimulation (manual), Patient/Family education, Balance training, Stair training, Taping, Dry Needling, Joint mobilization, Joint manipulation, Vestibular training, Visual/preceptual remediation/compensation, DME instructions, Cryotherapy, Moist heat, and Biofeedback  PLAN FOR NEXT SESSION: *** - follow-up with [facility] regarding potential transition to hinged AFO   - pt has apt on June 4th - high intensity gait training using +2 HHA vs litegait support vs  L[place]strand crutch  - continue adding AW to L LE  - continue use of litegait harness in upcoming visits - L LE stretching and weightbearing for tone management and to improve alignment during gait/standing  - bridges with R foot placed on unstable surface to promote increased L LE wt bearing - stair navigation for L LE NMR - progress HEP - follow-up on when pt's next apt is for botox     Carlen Chasten, PT, DPT, NCS, CSRS Physical Therapist - [facility]  [facility]  12:46 PM 04/25/24

## 2024-04-25 NOTE — Telephone Encounter (Signed)
 Patient reports he didn't realize he had a therapy appointment today because he didn't see it written on his schedule printout. Patient reports he is disappointed because he is excited to continue gait training.  Patient states he has his AFO appointment with [facility] in [place] on Wednesday, June 4th, so he needs to cancel his therapy appointment.   Therapist reminded patient of next appointment time on Monday June, 9th at 2:45pm.   Carlen Chasten, PT, DPT, NCS, CSRS Physical Therapist - [facility]  [facility]  2:21 PM 04/25/24

## 2024-04-27 ENCOUNTER — Ambulatory Visit: Admitting: Physical Therapy

## 2024-04-27 NOTE — Therapy (Signed)
 OUTPATIENT PHYSICAL THERAPY NEURO TREATMENT   Patient Name: Matthew Black MRN: [id number] DOB:1976-07-06, 48 y.o., male Today's Date: 05/04/2024   PCP: Sylvia Everts, PA-C REFERRING PROVIDER: Liam Redhead, MD  END OF SESSION:   PT End of Session - 05/04/24 1338     Visit Number 13    Number of Visits 24    Date for PT Re-Evaluation 06/02/24    Authorization Type 4/17-7/10 for 24 PT visits    PT Start Time 1335    PT Stop Time 1402    PT Time Calculation (min) 27 min    Equipment Utilized During Treatment Gait belt;Other (comment)   L LE AFO   Activity Tolerance Patient tolerated treatment well    Behavior During Therapy 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   Stroke (HCC)    RICA, R A1, R MCA occlusion 07/19/19   Past Surgical History:  Procedure Laterality Date   CRANIOPLASTY Right 06/05/2020   Procedure: CRANIOPLASTY;  Surgeon: Augusto Blonder, MD;  Location: [facility] OR;  Service: Neurosurgery;  Laterality: Right;  right   CRANIOTOMY Right 07/19/2019   Procedure: RIGHT HEMI-CRANIECTOMY With implantation of skull flap to abdominal wall;  Surgeon: Augusto Blonder, MD;  Location: [facility] OR;  Service: Neurosurgery;  Laterality: Right;   CYST EXCISION N/A 10/08/2016   Procedure: EXCISION OF POSTERIOR NECK CYST;  Surgeon: Adalberto Acton, MD;  Location: [facility] ORS;  Service: General;  Laterality: N/A;   CYSTOSCOPY/URETEROSCOPY/HOLMIUM LASER/STENT PLACEMENT Right 03/16/2020   Procedure: CYSTOSCOPY RIGHT RETROGRADE  PYELOGRAM URETEROSCOPY/HOLMIUM LASER/STENT PLACEMENT;  Surgeon: Samson Croak, MD;  Location: [facility] ORS;  Service: Urology;  Laterality: Right;   INCISION AND DRAINAGE ABSCESS N/A 09/22/2014   Procedure: INCISION AND DRAINAGE ABSCESS POSTERIOR NECK;  Surgeon: Azucena Bollard, MD;  Location: [facility] ORS;  Service: General;  Laterality: N/A;   INCISION AND DRAINAGE ABSCESS N/A 12/20/2015   Procedure: INCISION AND DRAINAGE POSTERIOR NECK MASS;  Surgeon: Oralee Billow, MD;  Location: [facility] ORS;  Service: General;  Laterality: N/A;   INCISION AND DRAINAGE ABSCESS Left 07/10/2004   middle finger   IR IVC FILTER PLMT / S&I /IMG GUID/MOD SED  07/21/2019   IR RADIOLOGIST EVAL & MGMT  12/14/2019   IR RADIOLOGIST EVAL & MGMT  12/21/2020   IR VENOGRAM RENAL UNI RIGHT  07/21/2019   MASS EXCISION N/A 07/21/2017   Procedure: EXCISION OF BENIGN NECK LESION WITH LAYERED CLOSURE;  Surgeon: Alger Infield, MD;  Location: [facility];  Service: Plastics;  Laterality: N/A;   MASS EXCISION N/A 11/10/2017   Procedure: EXCISION BENIGN LESION OF THE NECK WITH LAYERED CLOSURE;  Surgeon: Alger Infield, MD;  Location: [facility];  Service: Plastics;  Laterality: N/A;   Patient Active Problem List   Diagnosis Date Noted   Hx of adenomatous colonic polyps 06/01/2023   COVID-19 virus infection 04/21/2021   Left-sided weakness 04/21/2021  S/P craniotomy 06/05/2020   History of cranioplasty 06/05/2020   Peri-rectal abscess 02/14/2020   Abnormal CT scan, pelvis 02/14/2020   Pancytopenia (HCC) 12/02/2019   Nephrolithiasis 12/02/2019   Hydronephrosis with renal and ureteral calculus obstruction 12/02/2019   Rectal pain 12/02/2019   Hyperkalemia 12/02/2019   Reactive depression    Wound infection after surgery    Sleep disturbance    Dysphagia, post-stroke    Transaminitis    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   Cerebral abscess    Urinary tract infection without hematuria     Altered mental status    Primary hypercoagulable state (HCC)    Acute pulmonary embolism without acute cor pulmonale (HCC)    Deep vein thrombosis (DVT) of non-extremity vein    Hypokalemia    Acute blood loss anemia    Leukocytosis    Endotracheal tube present    Acute respiratory failure with hypoxemia (HCC)    Stroke (cerebrum) (HCC) 07/19/2019   Pressure injury of skin 07/19/2019   Acute CVA (cerebrovascular accident) (HCC)    Encephalopathy    Dysphagia    Acute encephalopathy    Essential hypertension    Obesity 03/12/2016   Scalp abscess 12/20/2015   Neck abscess 12/20/2015   Pilonidal cyst 02/08/2013    ONSET DATE: stroke in August 2020  REFERRING DIAG: I63.9 (ICD-10-CM) - Cerebrovascular accident (CVA), unspecified mechanism (HCC)   THERAPY DIAG:   Muscle weakness (generalized)  Other lack of coordination  Other abnormalities of gait and mobility  Unsteadiness on feet  Hemiplegia and hemiparesis following cerebral infarction affecting left non-dominant side (HCC)  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)  Other symptoms and signs involving the nervous system   Rationale for Evaluation and Treatment: Rehabilitation  SUBJECTIVE:                                                                                                                                                                                             SUBJECTIVE STATEMENT:  Pt apologizes for late arrival stating he was accidentally looking at the wrong date and thought his session was at a later time. Pt states he hasn't tried wearing his black sneakers with his AFO because he is afraid of the pain it has previously caused. Pt states he was looking at [facility] to purchase another pair of the white New Balances like he has on today. Patient aware of need to upsize by 1/2 to 1 size larger. Denies pain. Denies stumbles/falls.     Initial Eval:  Patient reports his goal is to  walk without using a cane and without assistance. Patient states he wants to walk like he did before having the stroke. Reports his stroke was in August of 2020. States he currently uses a quad cane for ambulation and tries to walk as much as possible. Reports he has a manual wheelchair, but states he tries not to use it because it depresses him and makes him feel like he is back to when he first had his stroke. Reports wearing L LE custom AFO at all times.   Reports he is currently doing everything mod-I, but states he could call his mom, dad, or brothers if he needed assistance.  Reports he has routine botox  injections in L UE to help manage tone, but doesn't receive any in his LE.  Reports he has a hospital bed, but sleeps in a regular bed.  Pt accompanied by: self  PERTINENT HISTORY: L hemiparesis s/p R ACA and MCA territory CVA in August 2020, HTN, hx of kidney stones, depression  Per Neurology MD note on 11/24/2023: 68-year-old African-American male with large right hemispheric infarct due to right internal carotid and middle cerebral artery occlusion with cytotoxic edema and brain herniation s/p hemicraniectomy in 06/2019 is doing reasonably well with residual left spastic hemiparesis and left hemianopia.  Etiology of carotid occlusion unclear possibly dissection.  He had a prolonged hospital admission with several complications including DVT, pulmonary embolism, hemorrhagic transformation, abdominal wall hematoma, UTI but made quite remarkable recovery   PAIN:  Are you having pain? No, denies pain today, but states sometimes will have L shoulder pain  PRECAUTIONS: Fall  RED FLAGS: None   WEIGHT BEARING RESTRICTIONS: No  FALLS: Has patient fallen in last 6 months? Yes, stating he fell down his stairs ~5 months ago and is now scared to perform stair navigation  LIVING ENVIRONMENT: Lives with: lives alone Lives in: House/apartment Stairs: Yes: Internal:  flight steps; can reach both and External: 4 steps; can reach both, but states he doesn't have to go upstairs Has following equipment at home: Quad cane small base, Hemi walker, Wheelchair (manual), shower chair, Shower bench, and L LE custom AFO Pt has custom K5 wheelchair with rigid, contoured back support and Roho cushion. Pt reports he calls [employer] to come adjust his seat cushion when he feels it is getting uncomfortable.  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  DIAGNOSTIC FINDINGS:  EXAM: CT ANGIOGRAPHY HEAD AND NECK WITH AND WITHOUT CONTRAST IMPRESSION: 1. No acute intracranial abnormality. 2. Chronically occluded right ICA with minimal opacification of the right MCA and ACA branches. 3. Redemonstrated extensive encephalomalacia in the right MCA and ACA territories secondary to a prior infarct.  Electronically Signed   By: Clora Dane M.D.   On: 11/27/2023 13:45  COGNITION: Overall cognitive status: Within functional limits for tasks assessed  VISION (03/29/2024): Therapist performed visual field screen with pt appearing to have L homonomous hemianopsia - pt confirms he has loss of vision on L side.   SENSATION: Light touch: Impaired in L LE Proprioception: Impaired  in L LE  Can feel deep pressure in L LE   COORDINATION: Impaired  in L LE due to paresis, hypertonia, and overall decreased flexibility  EDEMA:  Not formally assessed  MUSCLE TONE: LLE: Mild, Moderate, and Hypertonic  MUSCLE LENGTH: Not formally assessed;  however, demonstrates L hamstring muscle tightness  DTRs:  Not formally assessed  POSTURE: rounded shoulders, forward head, and weight shift right  LOWER EXTREMITY ROM:       Right Eval Active ROM Left Eval Passive ROM  Hip flexion WFL Achieves at least 90  Hip extension    Hip abduction    Hip adduction    Hip internal rotation  Unable to achieve neutral hip rotation, lacking internal rotation  Hip external rotation  [facility] and rests in excessive ER  Knee flexion [facility]  Knee extension WFL Able to achieve terminal knee extension in supine, but with significant discomfort due to hamstring tone  Ankle dorsiflexion WFL Did not remove AFO to assess this date  Ankle plantarflexion WFL Did not remove AFO to assess this date  Ankle inversion    Ankle eversion     (Blank rows = not tested)  LOWER EXTREMITY MMT:    MMT Right Eval Left Eval  Hip flexion 4+ 2-  (compensates with hip adductor activation)  Hip extension    Hip abduction    Hip adduction  3+  Hip internal rotation  2-  Hip external rotation  2  Knee flexion 4+ 1  Knee extension 4+ 3+  Ankle dorsiflexion 4+ Did not remove AFO to assess this date  Ankle plantarflexion 4+ Did not remove AFO to assess this date  Ankle inversion    Ankle eversion    (Blank rows = not tested)  Manual Muscle Test Scale 0/5 = No muscle contraction can be seen or felt 1/5 = Contraction can be felt, but there is no motion 2-/5 = Part moves through incomplete ROM w/ gravity decreased 2/5 = Part moves through complete ROM w/ gravity decreased 2+/5 = Part moves through incomplete ROM (<50%) against gravity or through complete ROM w/ gravity 3-/5 = Part moves through incomplete ROM (>50%) against gravity 3/5 = Part moves through complete ROM against gravity 3+/5 = Part moves through complete ROM against gravity/slight resistance 4-/5= Holds test position against slight to moderate pressure 4/5 = Part moves through complete ROM against  gravity/moderate resistance 4+/5= Holds test position against moderate to strong pressure 5/5 = Part moves through complete ROM against gravity/full resistance  BED MOBILITY: (sleeps in regular bed, but has hospital bed available) Findings: Sit to supine CGA Supine to sit Min A and Mod A Rolling to Right Mod A Rolling to Left Min A and Mod A  TRANSFERS: Sit to stand: CGA and Min A  Assistive device utilized: Counselling psychologist     Stand to sit: CGA and Min A  Assistive device utilized: Tree surgeon to chair: Min A  Assistive device utilized: Counselling psychologist       RAMP:  Not tested  CURB:  Not tested  STAIRS: Not tested *need to assess*  GAIT: Findings: advances L LE with excessive hip external rotation using hip adductors, step to pattern, decreased arm swing- Left, decreased step length- Right, decreased stance time- Left, decreased stride length, decreased hip/knee flexion- Left, decreased ankle dorsiflexion- Left, lateral lean- Right, and decreased trunk rotation Distance walked: ~6ft Assistive device utilized: small based quad cane in R UE  Level of assistance: CGA and Min A  FUNCTIONAL TESTS:  5 times sit to stand: need to assess Timed up and go (TUG): need to assess 6 minute walk test: need to assess 10 meter walk test: 0.64m/s using small based quad cane and CGA Berg Balance Scale: need to assess  PATIENT SURVEYS:  Stroke Impact Scale 03/15/2024: 49/80                                                                                                                               TREATMENT DATE: 05/04/2024  Therapist spoke with Bernerd [facility] from [facility] regarding pt's AFO and she said pt is not eligible for a new AFO until approximately 1 year from now and that his current AFO has been stretched multiple times already in the calf area with limited ability to stretch it further.   Therapist educated pt on also looking to purchase wide  shoes as well as shoes that are deeper to ensure adequate space in the shoe for his foot and the brace.  Arrived to session in transport chair.   Therapist educated patient on the Motus Nova Foot device and signed patient up to receive a phone call with information about it. Therapist reached out to representative to sign pt up to participate in a demonstration of Motus foot during PT session on 6/18  Gait training 257ft using +2 heavy mod assist for R HHA and therapist providing skilled min/mod A for balance and facilitating improved L LE gait mechanics. Pt demonstrating the following gait deviations with therapist providing the described cuing and facilitation for improvement:  Continues to have excessive L LE external rotation, but improving and anticipate as pt increases hip flexor strength to clear foot more successfully that this will improve further Pt advances L LE without assist although impaired mechanics Pt pressing down significantly through +2 R HHA Cuing for improved trunk/hip extension for upright posture and decreased reliance on R HHA Facilitating increased and prolonged L weight shift during L stance phase as well as increased L hip extension to advance pelvis over L foot to allow reciprocal, larger step on R side No concern for L knee buckle as pt continues to have full extension moving towards hyperextension Pt became very fatigued at end of this gait distance requiring 3rd person to bring chair up behind him for seated rest break     PATIENT EDUCATION: Education details: Therapy POC, LTGs, HEP, importance of improving L LE flexibility, findings during assessment  Person educated: Patient Education method: Explanation and Handouts Education comprehension: verbalized understanding and needs further education  HOME EXERCISE PROGRAM:  Access Code: [email or web address] URL: [email or web address] Date: 03/10/2024 Prepared by: Carlen Chasten  Exercises - Seated Hip  Adduction Isometrics with Ball  - 1 x daily - 7 x weekly - 2 sets - 10 reps - 5 seconds hold - Supine Quadricep Sets  - 1 x daily - 7 x weekly - 2 sets - 10 reps - 3 seconds hold   GOALS: Goals  reviewed with patient? Yes  SHORT TERM GOALS: Target date: 04/21/2024  Patient will be independent in home exercise program to improve strength/mobility for better functional independence with ADLs.  Baseline: initiated on 03/10/2024 04/14/2024: continued Goal status: IN PROGRESS   LONG TERM GOALS: Target date: 06/02/2024   Patient (< 54 years old) will complete five times sit to stand (5XSTS) test in < 10 seconds indicating an increased LE strength and improved balance.  Baseline: 03/15/2024: 16.53 seconds from standard green chair relying on R hand to push-up from armrest and pt's personal SBQC nearby, but not using it VERSUS 25.85 seconds with R hand across chest, with min A for balance/safety without use of UE support 04/14/2024: 8.96 seconds using R UE support to push-up from armrest and pt's SBQC next to him, 13.75 seconds with R hand across chest with min A for therapist to stabilize chair behind him Goal status: IN PROGRESS  2.  Patient will increase Berg Balance score to > 45/56 to demonstrate improved balance and decreased fall risk during functional activities and ADLs.  Baseline: 03/15/2024: 14/56 04/14/2024: 22/56 Goal status: IN PROGRESS  3.  Patient will increase 10 meter walk test to >1.50m/s using LRAD as to improve gait speed for better community ambulation and to reduce fall risk.  Baseline: 0.30 m/s using small based quad cane with CGA and 1x light min A due to balance instability  04/14/2024: 0.405 m/s using SBQC with close SBA for safety Goal status: IN PROGRESS  4.  Patient will increase six minute walk test distance to >573ft for progression towards community ambulator and improve gait ability  Baseline: 262ft with [facility]  04/20/2024:  236ft with SBQC and SBA Goal status: IN  PROGRESS  5.  Patient will reduce timed up and go to <15 seconds to reduce fall risk and demonstrate improved transfer/gait ability. Baseline: 03/15/2024: 25.32 seconds using SBQC and chair with armrest as well as CGA for safety/steadying 04/14/2024: 22.25 seconds using SBQC & chair w/ armrest and close SBA for safety Goal status: IN PROGRESS  6.  Patient will ascend/descend 4 stairs, using railing, independently without loss of balance to improve ability to get in/out of home.   Baseline: 03/31/2024: using R UE support on each HR with skilled min assist for balance using primarily step-to pattern 04/20/2024:  using R UE support on each HR with skilled CGA for safety using step-to pattern Goal status: IN PROGRESS  ASSESSMENT:  CLINICAL IMPRESSION:   Patient is a 48 y.o. male who was seen today for physical therapy treatment for L hemiplegia (UE>LE) with increased tone, impaired balance, impaired functional mobility, impaired gait, and increased fall risk following CVA in 2020. Mr. Marzella is highly motivated to participate in therapy and improve his ability to walk. As noted above, pt demonstrates significant impairments in L LE strength and ROM as well as increased tone and significant sensory deficits impacting his functional mobility. Therapist educated pt on Motus Nova Foot device that could be used in conjunction with skilled physical therapy to address decreased L ankle DF/PF ROM and strength. Therapist reached out to representative to schedule a trial of the device during pt's PT session on June 18th. Pt continues to participate in gait training with focus on improved L LE gait mechanics to allow increased gait speed, improved gait endurance, and therefore improved community level functional mobility. Patient continues to benefit from facilitation for increased and prolonged L wt shift onto L stance limb. Mr. Buch will benefit from further  skilled PT to improve these deficits in order to increase  QOL, decrease fall risk, and ease/safety with ADLs and functional mobility.    OBJECTIVE IMPAIRMENTS: Abnormal gait, cardiopulmonary status limiting activity, decreased activity tolerance, decreased balance, decreased coordination, decreased endurance, decreased knowledge of use of DME, decreased mobility, difficulty walking, decreased ROM, decreased strength, hypomobility, impaired flexibility, impaired sensation, impaired tone, impaired UE functional use, improper body mechanics, postural dysfunction, and pain.   ACTIVITY LIMITATIONS: carrying, lifting, bending, standing, squatting, sleeping, stairs, transfers, bed mobility, continence, bathing, toileting, dressing, reach over head, hygiene/grooming, locomotion level, and caring for others  PARTICIPATION LIMITATIONS: meal prep, cleaning, laundry, medication management, shopping, community activity, and yard work  PERSONAL FACTORS: Age, Past/current experiences, Time since onset of injury/illness/exacerbation, Transportation, and 3+ comorbidities: HTN, hx of kidney stones, depression are also affecting patient's functional outcome.   REHAB POTENTIAL: Good  CLINICAL DECISION MAKING: Evolving/moderate complexity  EVALUATION COMPLEXITY: Moderate  PLAN:  PT FREQUENCY: 1-2x/week  PT DURATION: 12 weeks  PLANNED INTERVENTIONS: 97164- PT Re-evaluation, 97750- Physical Performance Testing, 97110-Therapeutic exercises, 97530- Therapeutic activity, [id number]- Neuromuscular re-education, 97535- Self Care, [id number]- Manual therapy, [id number]- Gait training, [phone]- Orthotic/Prosthetic subsequent, [phone]- Canalith repositioning, [phone]- Electrical stimulation (manual), Patient/Family education, Balance training, Stair training, Taping, Dry Needling, Joint mobilization, Joint manipulation, Vestibular training, Visual/preceptual remediation/compensation, DME instructions, Cryotherapy, Moist heat, and Biofeedback  PLAN FOR NEXT SESSION:  - follow-up on Motus Nova Foot  trial during PT on June 18th - high intensity gait training using +2 HHA vs litegait support vs Lofstrand crutch  - continue adding 7lb AW to L LE  - continue use of litegait harness in upcoming visits to decrease reliance on R UE support - L LE stretching and weightbearing for tone management and to improve alignment during gait/standing  - bridges with R foot placed on unstable surface to promote increased L LE wt bearing - stair navigation for L LE NMR - progress HEP - follow-up on when pt's next apt is for botox     Carlen Chasten, PT, DPT, NCS, CSRS Physical Therapist - [facility]  2:05 PM 05/04/24

## 2024-05-02 ENCOUNTER — Ambulatory Visit: Attending: Physical Medicine and Rehabilitation | Admitting: Physical Therapy

## 2024-05-02 DIAGNOSIS — R278 Other lack of coordination: Secondary | ICD-10-CM | POA: Diagnosis not present

## 2024-05-02 DIAGNOSIS — R29818 Other symptoms and signs involving the nervous system: Secondary | ICD-10-CM | POA: Diagnosis not present

## 2024-05-02 DIAGNOSIS — I69354 Hemiplegia and hemiparesis following cerebral infarction affecting left non-dominant side: Secondary | ICD-10-CM | POA: Diagnosis not present

## 2024-05-02 DIAGNOSIS — M6281 Muscle weakness (generalized): Secondary | ICD-10-CM | POA: Diagnosis not present

## 2024-05-02 DIAGNOSIS — R2681 Unsteadiness on feet: Secondary | ICD-10-CM | POA: Diagnosis not present

## 2024-05-02 DIAGNOSIS — R2689 Other abnormalities of gait and mobility: Secondary | ICD-10-CM | POA: Diagnosis not present

## 2024-05-02 NOTE — Therapy (Signed)
 OUTPATIENT PHYSICAL THERAPY NEURO TREATMENT   Patient Name: Matthew Black MRN: [id number] DOB:[birth year], 48 y.o., male Today's Date: 05/02/2024   PCP: Sylvia Everts, PA-C REFERRING PROVIDER: Liam Redhead, MD  END OF SESSION:   PT End of Session - 05/02/24 1448     Visit Number 12    Number of Visits 24    Date for PT Re-Evaluation 06/02/24    Authorization Type 4/17-7/10 for 24 PT visits    PT Start Time 1449    PT Stop Time 1533    PT Time Calculation (min) 44 min    Equipment Utilized During Treatment Gait belt;Other (comment)   L LE AFO   Activity Tolerance Patient tolerated treatment well    Behavior During Therapy [facility] for tasks assessed/performed               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   Stroke (HCC)    RICA, R A1, R MCA occlusion 07/19/19   Past Surgical History:  Procedure Laterality Date   CRANIOPLASTY Right 06/05/2020   Procedure: CRANIOPLASTY;  Surgeon: Augusto Blonder, MD;  Location: [facility] OR;  Service: Neurosurgery;  Laterality: Right;  right   CRANIOTOMY Right 07/19/2019   Procedure: RIGHT HEMI-CRANIECTOMY With implantation of skull flap to abdominal wall;  Surgeon: Augusto Blonder, MD;  Location: [facility] OR;  Service: Neurosurgery;  Laterality: Right;   CYST EXCISION N/A 10/08/2016   Procedure: EXCISION OF POSTERIOR NECK CYST;  Surgeon: Adalberto Acton, MD;  Location: [facility] ORS;  Service: General;  Laterality: N/A;   CYSTOSCOPY/URETEROSCOPY/HOLMIUM LASER/STENT PLACEMENT Right 03/16/2020   Procedure: CYSTOSCOPY RIGHT RETROGRADE  PYELOGRAM URETEROSCOPY/HOLMIUM LASER/STENT PLACEMENT;  Surgeon: Samson Croak, MD;  Location: [facility] ORS;  Service: Urology;  Laterality: Right;   INCISION AND DRAINAGE ABSCESS N/A 09/22/2014   Procedure: INCISION AND DRAINAGE ABSCESS POSTERIOR NECK;  Surgeon: Azucena Bollard, MD;  Location: [facility] ORS;  Service: General;  Laterality: N/A;   INCISION AND DRAINAGE ABSCESS N/A 12/20/2015   Procedure: INCISION AND DRAINAGE POSTERIOR NECK MASS;  Surgeon: Oralee Billow, MD;  Location: [facility] ORS;  Service: General;  Laterality: N/A;   INCISION AND DRAINAGE ABSCESS Left 07/10/2004   middle finger   IR IVC FILTER PLMT / S&I /IMG GUID/MOD SED  07/21/2019   IR RADIOLOGIST EVAL & MGMT  12/14/2019   IR RADIOLOGIST EVAL & MGMT  12/21/2020   IR VENOGRAM RENAL UNI RIGHT  07/21/2019   MASS EXCISION N/A 07/21/2017   Procedure: EXCISION OF BENIGN NECK LESION WITH LAYERED CLOSURE;  Surgeon: Alger Infield, MD;  Location: [facility];  Service: Plastics;  Laterality: N/A;   MASS EXCISION N/A 11/10/2017   Procedure: EXCISION BENIGN LESION OF THE NECK WITH LAYERED CLOSURE;  Surgeon: Alger Infield, MD;  Location: [facility];  Service: Plastics;  Laterality: N/A;   Patient Active Problem List   Diagnosis Date Noted   Hx of adenomatous colonic polyps 06/01/2023   COVID-19 virus infection 04/21/2021   Left-sided weakness 04/21/2021  S/P craniotomy 06/05/2020   History of cranioplasty 06/05/2020   Peri-rectal abscess 02/14/2020   Abnormal CT scan, pelvis 02/14/2020   Pancytopenia (HCC) 12/02/2019   Nephrolithiasis 12/02/2019   Hydronephrosis with renal and ureteral calculus obstruction 12/02/2019   Rectal pain 12/02/2019   Hyperkalemia 12/02/2019   Reactive depression    Wound infection after surgery    Sleep disturbance    Dysphagia, post-stroke    Transaminitis    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   Cerebral abscess    Urinary tract infection without hematuria     Altered mental status    Primary hypercoagulable state (HCC)    Acute pulmonary embolism without acute cor pulmonale (HCC)    Deep vein thrombosis (DVT) of non-extremity vein    Hypokalemia    Acute blood loss anemia    Leukocytosis    Endotracheal tube present    Acute respiratory failure with hypoxemia (HCC)    Stroke (cerebrum) (HCC) 07/19/2019   Pressure injury of skin 07/19/2019   Acute CVA (cerebrovascular accident) (HCC)    Encephalopathy    Dysphagia    Acute encephalopathy    Essential hypertension    Obesity 03/12/2016   Scalp abscess 12/20/2015   Neck abscess 12/20/2015   Pilonidal cyst 02/08/2013    ONSET DATE: stroke in August 2020  REFERRING DIAG: I63.9 (ICD-10-CM) - Cerebrovascular accident (CVA), unspecified mechanism (HCC)   THERAPY DIAG:   Muscle weakness (generalized)  Other lack of coordination  Other abnormalities of gait and mobility  Unsteadiness on feet  Hemiplegia and hemiparesis following cerebral infarction affecting left non-dominant side (HCC)  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)  Other symptoms and signs involving the nervous system   Rationale for Evaluation and Treatment: Rehabilitation  SUBJECTIVE:                                                                                                                                                                                             SUBJECTIVE STATEMENT:  Pt reports the next day after [facility] adjusted his AFO from a rigid to a hinged ankle he feels like they "took too much out of it."  Pt reports when he has an urgent need to use the bathroom then he will have to walk to the bathroom without his AFO because he cannot get it on quickly enough. Pt states he hasn't worn the brace with his black shoes to know if he is still having pain with them. Pt states he doesn't want to wear velcro shoes. Pt states his son or daughter could help  him tie regular laced shoes.   Denies falls since last session.    Initial Eval: Patient reports his goal is to walk without using a cane and without assistance. Patient states he wants to walk like he did before having the stroke. Reports his stroke was in August of 2020. States he currently uses a quad cane for ambulation and tries to walk as much as possible. Reports he has a manual wheelchair, but states he tries not to use it because it depresses him and makes him feel like he is back to when he first had his stroke. Reports wearing L LE custom AFO at all times.   Reports he is currently doing everything mod-I, but states he could call his mom, dad, or brothers if he needed assistance.  Reports he has routine botox  injections in L UE to help manage tone, but doesn't receive any in his LE.  Reports he has a hospital bed, but sleeps in a regular bed.  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  Etiology of carotid occlusion unclear possibly dissection.  He had a prolonged hospital admission with several complications including DVT, pulmonary embolism, hemorrhagic transformation, abdominal wall hematoma, UTI but made quite remarkable recovery   PAIN:  Are you having pain? No, denies pain today, but states sometimes will have L shoulder pain  PRECAUTIONS: Fall  RED FLAGS: None   WEIGHT BEARING RESTRICTIONS: No  FALLS: Has patient fallen in last 6 months? Yes, stating he fell down his stairs ~5 months ago and is now scared to perform stair navigation  LIVING  ENVIRONMENT: Lives with: lives alone Lives in: House/apartment Stairs: Yes: Internal: flight steps; can reach both and External: 4 steps; can reach both, but states he doesn't have to go upstairs Has following equipment at home: Quad cane small base, Hemi walker, Wheelchair (manual), shower chair, Shower bench, and L LE custom AFO Pt has custom K5 wheelchair with rigid, contoured back support and Roho cushion. Pt reports he calls [facility] to come adjust his seat cushion when he feels it is getting uncomfortable.  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  DIAGNOSTIC FINDINGS:  EXAM: CT ANGIOGRAPHY HEAD AND NECK WITH AND WITHOUT CONTRAST IMPRESSION: 1. No acute intracranial abnormality. 2. Chronically occluded right ICA with minimal opacification of the right MCA and ACA branches. 3. Redemonstrated extensive encephalomalacia in the right MCA and ACA territories secondary to a prior infarct.  Electronically Signed   By: Clora Dane M.D.   On: 11/27/2023 13:45  COGNITION: Overall cognitive status: Within functional limits for tasks assessed  VISION (03/29/2024): Therapist performed visual field screen with pt appearing to have L homonomous hemianopsia - pt confirms he has loss of vision on L side.   SENSATION: Light  touch: Impaired in L LE Proprioception: Impaired  in L LE  Can feel deep pressure in L LE   COORDINATION: Impaired in L LE due to paresis, hypertonia, and overall decreased flexibility  EDEMA:  Not formally  assessed  MUSCLE TONE: LLE: Mild, Moderate, and Hypertonic  MUSCLE LENGTH: Not formally assessed; however, demonstrates L hamstring muscle tightness  DTRs:  Not formally assessed  POSTURE: rounded shoulders, forward head, and weight shift right  LOWER EXTREMITY ROM:       Right Eval Active ROM Left Eval Passive ROM  Hip flexion WFL Achieves at least 90  Hip extension    Hip abduction    Hip adduction    Hip internal rotation  Unable to achieve neutral hip rotation, lacking internal rotation  Hip external rotation  [facility] and rests in excessive ER  Knee flexion [facility]  Knee extension WFL Able to achieve terminal knee extension in supine, but with significant discomfort due to hamstring tone  Ankle dorsiflexion WFL Did not remove AFO to assess this date  Ankle plantarflexion WFL Did not remove AFO to assess this date  Ankle inversion    Ankle eversion     (Blank rows = not tested)  LOWER EXTREMITY MMT:    MMT Right Eval Left Eval  Hip flexion 4+ 2-  (compensates with hip adductor activation)  Hip extension    Hip abduction    Hip adduction  3+  Hip internal rotation  2-  Hip external rotation  2  Knee flexion 4+ 1  Knee extension 4+ 3+  Ankle dorsiflexion 4+ Did not remove AFO to assess this date  Ankle plantarflexion 4+ Did not remove AFO to assess this date  Ankle inversion    Ankle eversion    (Blank rows = not tested)  Manual Muscle Test Scale 0/5 = No muscle contraction can be seen or felt 1/5 = Contraction can be felt, but there is no motion 2-/5 = Part moves through incomplete ROM w/ gravity decreased 2/5 = Part moves through complete ROM w/ gravity decreased 2+/5 = Part moves through incomplete ROM (<50%) against gravity or through complete ROM w/ gravity 3-/5 = Part moves through incomplete ROM (>50%) against gravity 3/5 = Part moves through complete ROM against gravity 3+/5 = Part moves through complete ROM against gravity/slight resistance 4-/5=  Holds test position against slight to moderate pressure 4/5 = Part moves through complete ROM against gravity/moderate resistance 4+/5= Holds test position against moderate to strong pressure 5/5 = Part moves through complete ROM against gravity/full resistance  BED MOBILITY: (sleeps in regular bed, but has hospital bed available) Findings: Sit to supine CGA Supine to sit Min A and Mod A Rolling to Right Mod A Rolling to Left Min A and Mod A  TRANSFERS: Sit to stand: CGA and Min A  Assistive device utilized: Counselling psychologist     Stand to sit: CGA and Min A  Assistive device utilized: Tree surgeon to chair: Min A  Assistive device utilized: Counselling psychologist       RAMP:  Not tested  CURB:  Not tested  STAIRS: Not tested *need to assess*  GAIT: Findings: advances L LE with excessive hip external rotation using hip adductors, step to pattern, decreased arm swing- Left, decreased step length- Right, decreased stance time- Left, decreased stride length, decreased hip/knee flexion- Left, decreased ankle dorsiflexion- Left, lateral lean- Right, and decreased trunk rotation Distance  walked: ~46ft Assistive device utilized: small based quad cane in R UE  Level of assistance: CGA and Min A  FUNCTIONAL TESTS:  5 times sit to stand: need to assess Timed up and go (TUG): need to assess 6 minute walk test: need to assess 10 meter walk test: 0.59m/s using small based quad cane and CGA Berg Balance Scale: need to assess  PATIENT SURVEYS:  Stroke Impact Scale 03/15/2024: 49/80                                                                                                                               TREATMENT DATE: 05/02/2024  Gait training ~115ft into therapy clinic using [facility] with close SBA for safety. Pt demos more of a step-to or partial step-through pattern; however, L LE remains excessively externally rotated (ER), decreased L stance time and slow, but  improving gait speed.   Thearpist provides education to patient on the purpose of the AFO adjustments to help increase ankle AROM to allow him a more consistent reciprocal stepping pattern. Pt states he isn't a fan of Velcro shoes because of the way they look. Educated pt on purchasing wide and deep shoes with regular laces that his son or daughter can help him tie in the morning. Educated on purpose of needing regular laces rather than bungee laces to hold his foot down in the AFO more firmly and consistently.    Donned litegait harness to allow increased dynamic gait challenge.  Gait training 111ft x2 (seated break between) using litegait harness for safety, but not providing BWS (body weight support) with skilled min assist for litegait management and providing facilitation for L LE NMR. Pt demonstrating the following gait deviations with therapist providing the described cuing and facilitation for improvement:  Continues to have excessive L LE externally rotated, but not as drastic as when pt ambulating into therapy clinic R UE support on litegait handle throughout Therapist providing increased L weight shift during L stance phase as well as increased L hip extension to advance over L foot to allow reciprocal step on R side Donned 7lb AW on L LE for increased neural recruitment during 2nd gait trial HR 74bpm after 1st gait trial and 90bpm at end of 2nd gait trial   Pt reports increased fatigue this afternoon limiting his ambulation distance.   PATIENT EDUCATION: Education details: Therapy POC, LTGs, HEP, importance of improving L LE flexibility, findings during assessment  Person educated: Patient Education method: Explanation and Handouts Education comprehension: verbalized understanding and needs further education  HOME EXERCISE PROGRAM:  Access Code: [email or web address] URL: [email or web address] Date: 03/10/2024 Prepared by: Carlen Chasten  Exercises - Seated Hip Adduction  Isometrics with Ball  - 1 x daily - 7 x weekly - 2 sets - 10 reps - 5 seconds hold - Supine Quadricep Sets  - 1 x daily - 7 x weekly - 2 sets - 10 reps - 3 seconds hold   GOALS:  Goals reviewed with patient? Yes  SHORT TERM GOALS: Target date: 04/21/2024  Patient will be independent in home exercise program to improve strength/mobility for better functional independence with ADLs.  Baseline: initiated on 03/10/2024 04/14/2024: continued Goal status: IN PROGRESS   LONG TERM GOALS: Target date: 06/02/2024   Patient (< 34 years old) will complete five times sit to stand (5XSTS) test in < 10 seconds indicating an increased LE strength and improved balance.  Baseline: 03/15/2024: 16.53 seconds from standard green chair relying on R hand to push-up from armrest and pt's personal SBQC nearby, but not using it VERSUS 25.85 seconds with R hand across chest, with min A for balance/safety without use of UE support 04/14/2024: 8.96 seconds using R UE support to push-up from armrest and pt's SBQC next to him, 13.75 seconds with R hand across chest with min A for therapist to stabilize chair behind him Goal status: IN PROGRESS  2.  Patient will increase Berg Balance score to > 45/56 to demonstrate improved balance and decreased fall risk during functional activities and ADLs.  Baseline: 03/15/2024: 14/56 04/14/2024: 22/56 Goal status: IN PROGRESS  3.  Patient will increase 10 meter walk test to >1.17m/s using LRAD as to improve gait speed for better community ambulation and to reduce fall risk.  Baseline: 0.30 m/s using small based quad cane with CGA and 1x light min A due to balance instability  04/14/2024: 0.405 m/s using SBQC with close SBA for safety Goal status: IN PROGRESS  4.  Patient will increase six minute walk test distance to >542ft for progression towards community ambulator and improve gait ability  Baseline: 271ft with [facility]  04/20/2024:  268ft with SBQC and SBA Goal status: IN PROGRESS  5.   Patient will reduce timed up and go to <15 seconds to reduce fall risk and demonstrate improved transfer/gait ability. Baseline: 03/15/2024: 25.32 seconds using SBQC and chair with armrest as well as CGA for safety/steadying 04/14/2024: 22.25 seconds using SBQC & chair w/ armrest and close SBA for safety Goal status: IN PROGRESS  6.  Patient will ascend/descend 4 stairs, using railing, independently without loss of balance to improve ability to get in/out of home.   Baseline: 03/31/2024: using R UE support on each HR with skilled min assist for balance using primarily step-to pattern 04/20/2024:  using R UE support on each HR with skilled CGA for safety using step-to pattern Goal status: IN PROGRESS  ASSESSMENT:  CLINICAL IMPRESSION:   Patient is a 48 y.o. male who was seen today for physical therapy treatment for L hemiplegia (UE>LE) with increased tone, impaired balance, impaired functional mobility, impaired gait, and increased fall risk following CVA in 2020. Mr. Mcneill is highly motivated to participate in therapy and improve his ability to walk. As noted above, pt demonstrates significant impairments in L LE strength and ROM as well as increased tone and significant sensory deficits impacting his functional mobility. Patient had follow-up appointment with [facility] and AFO was adjusted from rigid ankle to hinged ankle to allow increased ankle AROM for improved ankle DF during mid to terminal stance phase of gait. Therapy session focused on higher intensity dynamic gait training using litegait harness for safety but not providing BWS in order to promote reciprocal stepping pattern and ambulating at increased gait speed. Patient reporting increased fatigue today limiting number of gait trials achieved; however, pt does demo improving gait mechanics and tolerated continued use of 7lb AW resistance. Mr. Cronkright will benefit from further skilled  PT to improve these deficits in order to increase QOL and  ease/safety with ADLs.    OBJECTIVE IMPAIRMENTS: Abnormal gait, cardiopulmonary status limiting activity, decreased activity tolerance, decreased balance, decreased coordination, decreased endurance, decreased knowledge of use of DME, decreased mobility, difficulty walking, decreased ROM, decreased strength, hypomobility, impaired flexibility, impaired sensation, impaired tone, impaired UE functional use, improper body mechanics, postural dysfunction, and pain.   ACTIVITY LIMITATIONS: carrying, lifting, bending, standing, squatting, sleeping, stairs, transfers, bed mobility, continence, bathing, toileting, dressing, reach over head, hygiene/grooming, locomotion level, and caring for others  PARTICIPATION LIMITATIONS: meal prep, cleaning, laundry, medication management, shopping, community activity, and yard work  PERSONAL FACTORS: Age, Past/current experiences, Time since onset of injury/illness/exacerbation, Transportation, and 3+ comorbidities: HTN, hx of kidney stones, depression are also affecting patient's functional outcome.   REHAB POTENTIAL: Good  CLINICAL DECISION MAKING: Evolving/moderate complexity  EVALUATION COMPLEXITY: Moderate  PLAN:  PT FREQUENCY: 1-2x/week  PT DURATION: 12 weeks  PLANNED INTERVENTIONS: 97164- PT Re-evaluation, 97750- Physical Performance Testing, 97110-Therapeutic exercises, 97530- Therapeutic activity, [id number]- Neuromuscular re-education, 97535- Self Care, [id number]- Manual therapy, [id number]- Gait training, [phone]- Orthotic/Prosthetic subsequent, [phone]- Canalith repositioning, [phone]- Electrical stimulation (manual), Patient/Family education, Balance training, Stair training, Taping, Dry Needling, Joint mobilization, Joint manipulation, Vestibular training, Visual/preceptual remediation/compensation, DME instructions, Cryotherapy, Moist heat, and Biofeedback  PLAN FOR NEXT SESSION:  - high intensity gait training using +2 HHA vs litegait support vs L[place]strand  crutch  - continue adding AW to L LE  - continue use of litegait harness in upcoming visits - L LE stretching and weightbearing for tone management and to improve alignment during gait/standing (has excessive hip ER)  - bridges with R foot placed on unstable surface to promote increased L LE wt bearing - stair navigation for L LE NMR - progress HEP - follow-up on when pt's next apt is for botox     Carlen Chasten, PT, DPT, NCS, CSRS Physical Therapist - [place]  [facility]  3:49 PM 05/02/24

## 2024-05-04 ENCOUNTER — Ambulatory Visit: Admitting: Physical Therapy

## 2024-05-04 DIAGNOSIS — I69354 Hemiplegia and hemiparesis following cerebral infarction affecting left non-dominant side: Secondary | ICD-10-CM | POA: Diagnosis not present

## 2024-05-04 DIAGNOSIS — R29818 Other symptoms and signs involving the nervous system: Secondary | ICD-10-CM | POA: Diagnosis not present

## 2024-05-04 DIAGNOSIS — R278 Other lack of coordination: Secondary | ICD-10-CM | POA: Diagnosis not present

## 2024-05-04 DIAGNOSIS — R2681 Unsteadiness on feet: Secondary | ICD-10-CM | POA: Diagnosis not present

## 2024-05-04 DIAGNOSIS — M6281 Muscle weakness (generalized): Secondary | ICD-10-CM

## 2024-05-04 DIAGNOSIS — R2689 Other abnormalities of gait and mobility: Secondary | ICD-10-CM

## 2024-05-09 ENCOUNTER — Ambulatory Visit: Admitting: Physical Therapy

## 2024-05-09 DIAGNOSIS — R278 Other lack of coordination: Secondary | ICD-10-CM

## 2024-05-09 DIAGNOSIS — R2681 Unsteadiness on feet: Secondary | ICD-10-CM | POA: Diagnosis not present

## 2024-05-09 DIAGNOSIS — R2689 Other abnormalities of gait and mobility: Secondary | ICD-10-CM | POA: Diagnosis not present

## 2024-05-09 DIAGNOSIS — I69354 Hemiplegia and hemiparesis following cerebral infarction affecting left non-dominant side: Secondary | ICD-10-CM | POA: Diagnosis not present

## 2024-05-09 DIAGNOSIS — M6281 Muscle weakness (generalized): Secondary | ICD-10-CM | POA: Diagnosis not present

## 2024-05-09 DIAGNOSIS — R29818 Other symptoms and signs involving the nervous system: Secondary | ICD-10-CM | POA: Diagnosis not present

## 2024-05-09 NOTE — Therapy (Signed)
 OUTPATIENT PHYSICAL THERAPY NEURO TREATMENT   Patient Name: Matthew Black MRN: [id number] DOB:19-Sep-1976, 48 y.o., male Today's Date: 05/09/2024   PCP: Sylvia Everts, PA-C REFERRING PROVIDER: Liam Redhead, MD  END OF SESSION:   PT End of Session - 05/09/24 1537     Visit Number 14    Number of Visits 24    Date for PT Re-Evaluation 06/02/24    Authorization Type 4/17-7/10 for 24 PT visits    PT Start Time 1537    PT Stop Time 1617    PT Time Calculation (min) 40 min    Equipment Utilized During Treatment Gait belt;Other (comment)   L LE AFO   Activity Tolerance Patient tolerated treatment well    Behavior During Therapy [facility] for tasks assessed/performed             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   Stroke (HCC)    RICA, R A1, R MCA occlusion 07/19/19   Past Surgical History:  Procedure Laterality Date   CRANIOPLASTY Right 06/05/2020   Procedure: CRANIOPLASTY;  Surgeon: Augusto Blonder, MD;  Location: [facility] OR;  Service: Neurosurgery;  Laterality: Right;  right   CRANIOTOMY Right 07/19/2019   Procedure: RIGHT HEMI-CRANIECTOMY With implantation of skull flap to abdominal wall;  Surgeon: Augusto Blonder, MD;  Location: [facility] OR;  Service: Neurosurgery;  Laterality: Right;   CYST EXCISION N/A 10/08/2016   Procedure: EXCISION OF POSTERIOR NECK CYST;  Surgeon: Adalberto Acton, MD;  Location: [facility]S;  Service: General;  Laterality: N/A;   CYSTOSCOPY/URETEROSCOPY/HOLMIUM LASER/STENT PLACEMENT Right 03/16/2020   Procedure: CYSTOSCOPY RIGHT RETROGRADE PYELOGRAM  URETEROSCOPY/HOLMIUM LASER/STENT PLACEMENT;  Surgeon: Samson Croak, MD;  Location: [facility];  Service: Urology;  Laterality: Right;   INCISION AND DRAINAGE ABSCESS N/A 09/22/2014   Procedure: INCISION AND DRAINAGE ABSCESS POSTERIOR NECK;  Surgeon: Azucena Bollard, MD;  Location: [facility];  Service: General;  Laterality: N/A;   INCISION AND DRAINAGE ABSCESS N/A 12/20/2015   Procedure: INCISION AND DRAINAGE POSTERIOR NECK MASS;  Surgeon: Oralee Billow, MD;  Location: [facility];  Service: General;  Laterality: N/A;   INCISION AND DRAINAGE ABSCESS Left 07/10/2004   middle finger   IR IVC FILTER PLMT / S&I /IMG GUID/MOD SED  07/21/2019   IR RADIOLOGIST EVAL & MGMT  12/14/2019   IR RADIOLOGIST EVAL & MGMT  12/21/2020   IR VENOGRAM RENAL UNI RIGHT  07/21/2019   MASS EXCISION N/A 07/21/2017   Procedure: EXCISION OF BENIGN NECK LESION WITH LAYERED CLOSURE;  Surgeon: Alger Infield, MD;  Location: [facility];  Service: Plastics;  Laterality: N/A;   MASS EXCISION N/A 11/10/2017   Procedure: EXCISION BENIGN LESION OF THE NECK WITH LAYERED CLOSURE;  Surgeon: Alger Infield, MD;  Location: [facility];  Service: Plastics;  Laterality: N/A;   Patient Active Problem List   Diagnosis Date Noted   Hx of adenomatous colonic polyps 06/01/2023   COVID-19 virus infection 04/21/2021   Left-sided weakness 04/21/2021   S/P craniotomy  06/05/2020   History of cranioplasty 06/05/2020   Peri-rectal abscess 02/14/2020   Abnormal CT scan, pelvis 02/14/2020   Pancytopenia (HCC) 12/02/2019   Nephrolithiasis 12/02/2019   Hydronephrosis with renal and ureteral calculus obstruction 12/02/2019   Rectal pain 12/02/2019   Hyperkalemia 12/02/2019   Reactive depression    Wound infection after surgery    Sleep disturbance    Dysphagia, post-stroke    Transaminitis    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   Cerebral abscess    Urinary tract infection without hematuria    Altered  mental status    Primary hypercoagulable state (HCC)    Acute pulmonary embolism without acute cor pulmonale (HCC)    Deep vein thrombosis (DVT) of non-extremity vein    Hypokalemia    Acute blood loss anemia    Leukocytosis    Endotracheal tube present    Acute respiratory failure with hypoxemia (HCC)    Stroke (cerebrum) (HCC) 07/19/2019   Pressure injury of skin 07/19/2019   Acute CVA (cerebrovascular accident) (HCC)    Encephalopathy    Dysphagia    Acute encephalopathy    Essential hypertension    Obesity 03/12/2016   Scalp abscess 12/20/2015   Neck abscess 12/20/2015   Pilonidal cyst 02/08/2013    ONSET DATE: stroke in August 2020  REFERRING DIAG: I63.9 (ICD-10-CM) - Cerebrovascular accident (CVA), unspecified mechanism (HCC)   THERAPY DIAG:   Muscle weakness (generalized)  Other lack of coordination  Other abnormalities of gait and mobility  Unsteadiness on feet  Hemiplegia and hemiparesis following cerebral infarction affecting left non-dominant side (HCC)  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)  Other symptoms and signs involving the nervous system   Rationale for Evaluation and Treatment: Rehabilitation  SUBJECTIVE:                                                                                                                                                                                             SUBJECTIVE STATEMENT:  Pt states he is feeling good today. States he ordered new New Balances and is waiting for them to arrive. Pt states his AFO is still not comfortable in his black shoes. Pt states AFO feels good when he wears his white New Balances. Denies pain today. Denies stumbles/falls.     Initial Eval: Patient reports his goal is to walk without using a cane and without assistance. Patient states he wants to walk like he did before having the stroke. Reports his stroke was in August of 2020. States he  currently uses a quad cane for ambulation  and tries to walk as much as possible. Reports he has a manual wheelchair, but states he tries not to use it because it depresses him and makes him feel like he is back to when he first had his stroke. Reports wearing L LE custom AFO at all times.   Reports he is currently doing everything mod-I, but states he could call his mom, dad, or brothers if he needed assistance.  Reports he has routine botox  injections in L UE to help manage tone, but doesn't receive any in his LE.  Reports he has a hospital bed, but sleeps in a regular bed.  Pt accompanied by: self  PERTINENT HISTORY: L hemiparesis s/p R ACA and MCA territory CVA in August 2020, HTN, hx of kidney stones, depression  Per Neurology MD note on 11/24/2023: 60-year-old African-American male with large right hemispheric infarct due to right internal carotid and middle cerebral artery occlusion with cytotoxic edema and brain herniation s/p hemicraniectomy in 06/2019 is doing reasonably well with residual left spastic hemiparesis and left hemianopia.  Etiology of carotid occlusion unclear possibly dissection.  He had a prolonged hospital admission with several complications including DVT, pulmonary embolism, hemorrhagic transformation, abdominal wall hematoma, UTI but made quite remarkable recovery   PAIN:  Are you having pain? No, denies pain today, but states sometimes will have L shoulder pain  PRECAUTIONS: Fall  RED FLAGS: None   WEIGHT BEARING RESTRICTIONS: No  FALLS: Has patient fallen in last 6 months? Yes, stating he fell down his stairs ~5 months ago and is now scared to perform stair navigation  LIVING ENVIRONMENT: Lives with: lives alone Lives in: House/apartment Stairs: Yes: Internal: flight steps; can reach both and External: 4 steps; can reach both, but states he doesn't have to go upstairs Has following equipment at home: Quad cane small base, Hemi walker, Wheelchair (manual),  shower chair, Shower bench, and L LE custom AFO Pt has custom K5 wheelchair with rigid, contoured back support and Roho cushion. Pt reports he calls [employer] to come adjust his seat cushion when he feels it is getting uncomfortable.  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  DIAGNOSTIC FINDINGS:  EXAM: CT ANGIOGRAPHY HEAD AND NECK WITH AND WITHOUT CONTRAST IMPRESSION: 1. No acute intracranial abnormality. 2. Chronically occluded right ICA with minimal opacification of the right MCA and ACA branches. 3. Redemonstrated extensive encephalomalacia in the right MCA and ACA territories secondary to a prior infarct.  Electronically Signed   By: Clora Dane M.D.   On: 11/27/2023 13:45  COGNITION: Overall cognitive status: Within functional limits for tasks assessed  VISION (03/29/2024): Therapist performed visual field screen with pt appearing to have L homonomous hemianopsia - pt confirms he has loss of vision on L side.   SENSATION: Light touch: Impaired in L LE Proprioception: Impaired  in L LE  Can feel deep pressure in L LE   COORDINATION: Impaired in L LE due to paresis, hypertonia, and overall decreased flexibility  EDEMA:  Not formally assessed  MUSCLE TONE: LLE: Mild, Moderate, and Hypertonic  MUSCLE LENGTH: Not formally assessed; however, demonstrates L hamstring muscle tightness  DTRs:  Not formally assessed  POSTURE: rounded shoulders, forward head, and weight shift right  LOWER EXTREMITY ROM:        Right Eval Active ROM Left Eval Passive ROM  Hip flexion WFL Achieves at least 90  Hip extension    Hip abduction    Hip adduction    Hip internal rotation  Unable to achieve neutral hip rotation, lacking internal rotation  Hip external rotation  [facility] and rests in excessive ER  Knee flexion [facility]  Knee extension WFL Able to achieve terminal knee extension in supine, but with significant discomfort due to hamstring tone  Ankle dorsiflexion WFL Did not remove AFO to assess this date  Ankle plantarflexion WFL Did not remove AFO to assess this date  Ankle inversion    Ankle eversion     (Blank rows = not tested)  LOWER EXTREMITY MMT:    MMT Right Eval Left Eval  Hip flexion 4+ 2-  (compensates with hip adductor activation)  Hip extension    Hip abduction    Hip adduction  3+  Hip internal rotation  2-  Hip external rotation  2  Knee flexion 4+ 1  Knee extension 4+ 3+  Ankle dorsiflexion 4+ Did not remove AFO to assess this date  Ankle plantarflexion 4+ Did not remove AFO to assess this date  Ankle inversion    Ankle eversion    (Blank rows = not tested)  Manual Muscle Test Scale 0/5 = No muscle contraction can be seen or felt 1/5 = Contraction can be felt, but there is no motion 2-/5 = Part moves through incomplete ROM w/ gravity decreased 2/5 = Part moves through complete ROM w/ gravity decreased 2+/5 = Part moves through incomplete ROM (<50%) against gravity or through complete ROM w/ gravity 3-/5 = Part moves through incomplete ROM (>50%) against gravity 3/5 = Part moves through complete ROM against gravity 3+/5 = Part moves through complete ROM against gravity/slight resistance 4-/5= Holds test position against slight to moderate pressure 4/5 = Part moves through complete ROM against gravity/moderate resistance 4+/5= Holds test position against moderate to strong pressure 5/5 = Part moves through complete ROM against gravity/full resistance  BED MOBILITY: (sleeps  in regular bed, but has hospital bed available) Findings: Sit to supine CGA Supine to sit Min A and Mod A Rolling to Right Mod A Rolling to Left Min A and Mod A  TRANSFERS: Sit to stand: CGA and Min A  Assistive device utilized: Counselling psychologist     Stand to sit: CGA and Min A  Assistive device utilized: Tree surgeon to chair: Min A  Assistive device utilized: Counselling psychologist       RAMP:  Not tested  CURB:  Not tested  STAIRS: Not tested *need to assess*  GAIT: Findings: advances L LE with excessive hip external rotation using hip adductors, step to pattern, decreased arm swing- Left, decreased step length- Right, decreased stance time- Left, decreased stride length, decreased hip/knee flexion- Left, decreased ankle dorsiflexion- Left, lateral lean- Right, and decreased trunk rotation Distance walked: ~69ft Assistive device utilized: small based quad cane in R UE  Level of assistance: CGA and Min A  FUNCTIONAL TESTS:  5 times sit to stand: need to assess Timed up and go (TUG): need to assess 6 minute walk test: need to assess 10 meter walk test: 0.20m/s using small based quad cane and CGA [employer]  Scale: need to assess  PATIENT SURVEYS:  Stroke Impact Scale 03/15/2024: 49/80                                                                                                                               TREATMENT DATE: 05/09/2024  Gait training ~123ft into therapy clinic using [facility] with close SBA for safety. Pt demos more consistent step-through pattern; however, L LE remains excessively externally rotated (ER) but improving as well as increasing L stance time and slow, but improving gait speed.   Therapist followed up on patient having trial of Motus Nova Foot device during PT session on 6/18.   Donned litegait harness  Gait training 315ft + 316ft (seated break between) in litegait harness without BWS, but straps providing balance support, with  skilled min A to facilitate improved L LE gait mechanics and manage litegait. Pt demonstrating the following gait deviations with therapist providing the described cuing and facilitation for improvement:  Reciprocal stepping pattern >95% of the time Therapist facilitating:  Increased L wt shift onto L stance limb as well as longer L stance time Anterior progression of L hip/pelvis over L stance phase to promote increased L glute activation and longer R step length  Therapist blocking pt compensating of trying to open up L hip to circumduct L foot during swing advancement  Therapist assisting with managing the litegait machine Attempted no UE support for ~9ft progressed to ~173ft on 2nd trial with pt maintaining it ~70-85% of the time Improved L wt shift and longer L stance phase time with cuing and exposure/repetition Cuing for trunk/hip extension to improve upright posture, especially as pt fatigues Pt continues to have occasional L knee snapping back towards hyperextension during stance    PATIENT EDUCATION: Education details: Therapy POC, LTGs, HEP, importance of improving L LE flexibility, findings during assessment  Person educated: Patient Education method: Explanation and Handouts Education comprehension: verbalized understanding and needs further education  HOME EXERCISE PROGRAM:  Access Code: [email or web address] URL: [email or web address] Date: 03/10/2024 Prepared by: Carlen Chasten  Exercises - Seated Hip Adduction Isometrics with Ball  - 1 x daily - 7 x weekly - 2 sets - 10 reps - 5 seconds hold - Supine Quadricep Sets  - 1 x daily - 7 x weekly - 2 sets - 10 reps - 3 seconds hold   GOALS: Goals reviewed with patient? Yes  SHORT TERM GOALS: Target date: 04/21/2024  Patient will be independent in home exercise program to improve strength/mobility for better functional independence with ADLs.  Baseline: initiated on 03/10/2024 04/14/2024: continued Goal status: IN  PROGRESS   LONG TERM GOALS: Target date: 06/02/2024   Patient (< 35 years old) will complete five times sit to stand (5XSTS) test in < 10 seconds indicating an increased LE strength and improved balance.  Baseline: 03/15/2024: 16.53 seconds from standard green chair relying on R hand to push-up from armrest and pt's personal SBQC nearby, but not  using it VERSUS 25.85 seconds with R hand across chest, with min A for balance/safety without use of UE support 04/14/2024: 8.96 seconds using R UE support to push-up from armrest and pt's SBQC next to him, 13.75 seconds with R hand across chest with min A for therapist to stabilize chair behind him Goal status: IN PROGRESS  2.  Patient will increase Berg Balance score to > 45/56 to demonstrate improved balance and decreased fall risk during functional activities and ADLs.  Baseline: 03/15/2024: 14/56 04/14/2024: 22/56 Goal status: IN PROGRESS  3.  Patient will increase 10 meter walk test to >1.43m/s using LRAD as to improve gait speed for better community ambulation and to reduce fall risk.  Baseline: 0.30 m/s using small based quad cane with CGA and 1x light min A due to balance instability  04/14/2024: 0.405 m/s using SBQC with close SBA for safety Goal status: IN PROGRESS  4.  Patient will increase six minute walk test distance to >570ft for progression towards community ambulator and improve gait ability  Baseline: 266ft with [facility]  04/20/2024:  235ft with SBQC and SBA Goal status: IN PROGRESS  5.  Patient will reduce timed up and go to <15 seconds to reduce fall risk and demonstrate improved transfer/gait ability. Baseline: 03/15/2024: 25.32 seconds using SBQC and chair with armrest as well as CGA for safety/steadying 04/14/2024: 22.25 seconds using SBQC & chair w/ armrest and close SBA for safety Goal status: IN PROGRESS  6.  Patient will ascend/descend 4 stairs, using railing, independently without loss of balance to improve ability to get in/out  of home.   Baseline: 03/31/2024: using R UE support on each HR with skilled min assist for balance using primarily step-to pattern 04/20/2024:  using R UE support on each HR with skilled CGA for safety using step-to pattern Goal status: IN PROGRESS  ASSESSMENT:  CLINICAL IMPRESSION:    Patient is a 48 y.o. male who was seen today for physical therapy treatment for L hemiplegia (UE>LE) with increased tone, impaired balance, impaired functional mobility, impaired gait, and increased fall risk following CVA in 2020. Mr. Rubey is highly motivated to participate in therapy and improve his ability to walk. As noted above, pt demonstrates significant impairments in L LE strength and ROM as well as increased tone and significant sensory deficits impacting his functional mobility. Therapist follow-up on scheduling trial of Motus Nova Foot device during next physical therapy session, which could be used in conjunction with skilled physical therapy to address decreased L ankle DF/PF ROM and strength. Pt continues to participate in gait training with focus on improved L LE gait mechanics to allow increased gait speed, improved gait endurance, and therefore improved community level functional mobility. Patient progressed to ambulating 2x 358ft during the session today with increased consistency of improved gait mechanics!! Patient continues to benefit from facilitation for increased and prolonged L wt shift onto L stance limb. Mr. Buchanon will benefit from further skilled PT to improve these deficits in order to increase QOL, decrease fall risk, and ease/safety with ADLs and functional mobility.    OBJECTIVE IMPAIRMENTS: Abnormal gait, cardiopulmonary status limiting activity, decreased activity tolerance, decreased balance, decreased coordination, decreased endurance, decreased knowledge of use of DME, decreased mobility, difficulty walking, decreased ROM, decreased strength, hypomobility, impaired flexibility, impaired  sensation, impaired tone, impaired UE functional use, improper body mechanics, postural dysfunction, and pain.   ACTIVITY LIMITATIONS: carrying, lifting, bending, standing, squatting, sleeping, stairs, transfers, bed mobility, continence, bathing, toileting, dressing, reach over  head, hygiene/grooming, locomotion level, and caring for others  PARTICIPATION LIMITATIONS: meal prep, cleaning, laundry, medication management, shopping, community activity, and yard work  PERSONAL FACTORS: Age, Past/current experiences, Time since onset of injury/illness/exacerbation, Transportation, and 3+ comorbidities: HTN, hx of kidney stones, depression are also affecting patient's functional outcome.   REHAB POTENTIAL: Good  CLINICAL DECISION MAKING: Evolving/moderate complexity  EVALUATION COMPLEXITY: Moderate  PLAN:  PT FREQUENCY: 1-2x/week  PT DURATION: 12 weeks  PLANNED INTERVENTIONS: 97164- PT Re-evaluation, 97750- Physical Performance Testing, 97110-Therapeutic exercises, 97530- Therapeutic activity, [id number]- Neuromuscular re-education, 97535- Self Care, 16109- Manual therapy, [id number]- Gait training, [phone]- Orthotic/Prosthetic subsequent, [phone]- Canalith repositioning, [phone]- Electrical stimulation (manual), Patient/Family education, Balance training, Stair training, Taping, Dry Needling, Joint mobilization, Joint manipulation, Vestibular training, Visual/preceptual remediation/compensation, DME instructions, Cryotherapy, Moist heat, and Biofeedback  PLAN FOR NEXT SESSION:  - trial Motus Nova Foot device during PT on June 18th - high intensity gait training using +2 HHA vs litegait support vs Lofstrand crutch  - continue adding 7lb AW to L LE when appropriate vs decreasing RUE support/reliance while in litegait  - continue use of litegait harness in upcoming visits to decrease reliance on R UE support - L LE stretching and weightbearing for tone management and to improve alignment during gait/standing  -  bridges with R foot placed on unstable surface to promote increased L LE wt bearing - stair navigation for L LE NMR - progress HEP - follow-up on when pt's next apt is for botox     Carlen Chasten, PT, DPT, NCS, CSRS Physical Therapist - [facility]  [facility]  4:19 PM 05/09/24

## 2024-05-11 ENCOUNTER — Ambulatory Visit: Admitting: Physical Therapy

## 2024-05-11 DIAGNOSIS — R29818 Other symptoms and signs involving the nervous system: Secondary | ICD-10-CM | POA: Diagnosis not present

## 2024-05-11 DIAGNOSIS — R2681 Unsteadiness on feet: Secondary | ICD-10-CM | POA: Diagnosis not present

## 2024-05-11 DIAGNOSIS — R2689 Other abnormalities of gait and mobility: Secondary | ICD-10-CM

## 2024-05-11 DIAGNOSIS — I69354 Hemiplegia and hemiparesis following cerebral infarction affecting left non-dominant side: Secondary | ICD-10-CM | POA: Diagnosis not present

## 2024-05-11 DIAGNOSIS — M6281 Muscle weakness (generalized): Secondary | ICD-10-CM

## 2024-05-11 DIAGNOSIS — R278 Other lack of coordination: Secondary | ICD-10-CM | POA: Diagnosis not present

## 2024-05-11 NOTE — Therapy (Signed)
 OUTPATIENT PHYSICAL THERAPY NEURO TREATMENT   Patient Name: Matthew Black MRN: [id number] DOB:09-Oct-1976, 48 y.o., male Today's Date: 05/11/2024   PCP: Dorina Loving, PA-C REFERRING PROVIDER: Lorilee Sven SQUIBB, MD  END OF SESSION:   PT End of Session - 05/11/24 1611     Visit Number 15    Number of Visits 24    Date for PT Re-Evaluation 06/02/24    Authorization Type 4/17-7/10 for 24 PT visits    PT Start Time 1540    PT Stop Time 1615    PT Time Calculation (min) 35 min    Equipment Utilized During Treatment Gait belt;Other (comment)   L LE AFO   Activity Tolerance Patient tolerated treatment well    Behavior During Therapy [facility] for tasks assessed/performed             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   Stroke (HCC)    RICA, R A1, R MCA occlusion 07/19/19   Past Surgical History:  Procedure Laterality Date   CRANIOPLASTY Right 06/05/2020   Procedure: CRANIOPLASTY;  Surgeon: Lanis Pupa, MD;  Location: [facility] OR;  Service: Neurosurgery;  Laterality: Right;  right   CRANIOTOMY Right 07/19/2019   Procedure: RIGHT HEMI-CRANIECTOMY With implantation of skull flap to abdominal wall;  Surgeon: Lanis Pupa, MD;  Location: [facility] OR;  Service: Neurosurgery;  Laterality: Right;   CYST EXCISION N/A 10/08/2016   Procedure: EXCISION OF POSTERIOR NECK CYST;  Surgeon: Mitzie DELENA Freund, MD;  Location: [facility];  Service: General;  Laterality: N/A;   CYSTOSCOPY/URETEROSCOPY/HOLMIUM LASER/STENT PLACEMENT Right 03/16/2020   Procedure: CYSTOSCOPY RIGHT RETROGRADE PYELOGRAM  URETEROSCOPY/HOLMIUM LASER/STENT PLACEMENT;  Surgeon: Carolee Sherwood JONETTA DOUGLAS, MD;  Location: [facility];  Service: Urology;  Laterality: Right;   INCISION AND DRAINAGE ABSCESS N/A 09/22/2014   Procedure: INCISION AND DRAINAGE ABSCESS POSTERIOR NECK;  Surgeon: Donnice KATHEE Lunger, MD;  Location: [facility];  Service: General;  Laterality: N/A;   INCISION AND DRAINAGE ABSCESS N/A 12/20/2015   Procedure: INCISION AND DRAINAGE POSTERIOR NECK MASS;  Surgeon: Krystal Spinner, MD;  Location: [facility];  Service: General;  Laterality: N/A;   INCISION AND DRAINAGE ABSCESS Left 07/10/2004   middle finger   IR IVC FILTER PLMT / S&I /IMG GUID/MOD SED  07/21/2019   IR RADIOLOGIST EVAL & MGMT  12/14/2019   IR RADIOLOGIST EVAL & MGMT  12/21/2020   IR VENOGRAM RENAL UNI RIGHT  07/21/2019   MASS EXCISION N/A 07/21/2017   Procedure: EXCISION OF BENIGN NECK LESION WITH LAYERED CLOSURE;  Surgeon: Arelia Filippo, MD;  Location: [facility];  Service: Plastics;  Laterality: N/A;   MASS EXCISION N/A 11/10/2017   Procedure: EXCISION BENIGN LESION OF THE NECK WITH LAYERED CLOSURE;  Surgeon: Arelia Filippo, MD;  Location: [facility];  Service: Plastics;  Laterality: N/A;   Patient Active Problem List   Diagnosis Date Noted   Hx of adenomatous colonic polyps 06/01/2023   COVID-19 virus infection 04/21/2021   Left-sided weakness 04/21/2021   S/P craniotomy  06/05/2020   History of cranioplasty 06/05/2020   Peri-rectal abscess 02/14/2020   Abnormal CT scan, pelvis 02/14/2020   Pancytopenia (HCC) 12/02/2019   Nephrolithiasis 12/02/2019   Hydronephrosis with renal and ureteral calculus obstruction 12/02/2019   Rectal pain 12/02/2019   Hyperkalemia 12/02/2019   Reactive depression    Wound infection after surgery    Sleep disturbance    Dysphagia, post-stroke    Transaminitis    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   Cerebral abscess    Urinary tract infection without hematuria    Altered  mental status    Primary hypercoagulable state (HCC)    Acute pulmonary embolism without acute cor pulmonale (HCC)    Deep vein thrombosis (DVT) of non-extremity vein    Hypokalemia    Acute blood loss anemia    Leukocytosis    Endotracheal tube present    Acute respiratory failure with hypoxemia (HCC)    Stroke (cerebrum) (HCC) 07/19/2019   Pressure injury of skin 07/19/2019   Acute CVA (cerebrovascular accident) (HCC)    Encephalopathy    Dysphagia    Acute encephalopathy    Essential hypertension    Obesity 03/12/2016   Scalp abscess 12/20/2015   Neck abscess 12/20/2015   Pilonidal cyst 02/08/2013    ONSET DATE: stroke in August 2020  REFERRING DIAG: I63.9 (ICD-10-CM) - Cerebrovascular accident (CVA), unspecified mechanism (HCC)   THERAPY DIAG:   Muscle weakness (generalized)  Other lack of coordination  Other abnormalities of gait and mobility  Unsteadiness on feet  Hemiplegia and hemiparesis following cerebral infarction affecting left non-dominant side (HCC)  Spastic hemiplegia of left nondominant side as late effect of cerebral infarction [facility])   Rationale for Evaluation and Treatment: Rehabilitation  SUBJECTIVE:                                                                                                                                                                                             SUBJECTIVE STATEMENT:  Arrives late to PT. Excited to trial robotic assist ankle movement.     Initial Eval: Patient reports his goal is to walk without using a cane and without assistance. Patient states he wants to walk like he did before having the stroke. Reports his stroke was in August of 2020. States he currently uses a quad cane for ambulation and tries to walk as much as possible. Reports he has a manual wheelchair, but states he tries not to use it because it depresses him and makes him feel like he is back to when he first had his stroke. Reports wearing L  LE custom AFO  at all times.   Reports he is currently doing everything mod-I, but states he could call his mom, dad, or brothers if he needed assistance.  Reports he has routine botox  injections in L UE to help manage tone, but doesn't receive any in his LE.  Reports he has a hospital bed, but sleeps in a regular bed.  Pt accompanied by: self  PERTINENT HISTORY: L hemiparesis s/p R ACA and MCA territory CVA in August 2020, HTN, hx of kidney stones, depression  Per Neurology MD note on 11/24/2023: 71-year-old African-American male with large right hemispheric infarct due to right internal carotid and middle cerebral artery occlusion with cytotoxic edema and brain herniation s/p hemicraniectomy in 06/2019 is doing reasonably well with residual left spastic hemiparesis and left hemianopia.  Etiology of carotid occlusion unclear possibly dissection.  He had a prolonged hospital admission with several complications including DVT, pulmonary embolism, hemorrhagic transformation, abdominal wall hematoma, UTI but made quite remarkable recovery   PAIN:  Are you having pain? No, denies pain today, but states sometimes will have L shoulder pain  PRECAUTIONS: Fall  RED FLAGS: None   WEIGHT BEARING RESTRICTIONS: No  FALLS: Has patient fallen in last 6 months? Yes, stating he fell down his stairs ~5 months ago and is now scared to perform stair navigation  LIVING ENVIRONMENT: Lives with: lives alone Lives in: House/apartment Stairs: Yes: Internal: flight steps; can reach both and External: 4 steps; can reach both, but states he doesn't have to go upstairs Has following equipment at home: Quad cane small base, Hemi walker, Wheelchair (manual), shower chair, Shower bench, and L LE custom AFO Pt has custom K5 wheelchair with rigid, contoured back support and Roho cushion. Pt reports he calls [employer] to come adjust his seat cushion when he feels it is getting uncomfortable.  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  DIAGNOSTIC FINDINGS:  EXAM: CT ANGIOGRAPHY HEAD AND NECK WITH AND WITHOUT CONTRAST IMPRESSION: 1. No acute intracranial abnormality. 2. Chronically occluded right ICA with minimal opacification of the right MCA and ACA branches. 3. Redemonstrated extensive encephalomalacia in the right MCA and ACA territories secondary to a prior infarct.  Electronically Signed   By: Lyndall Gore M.D.   On: 11/27/2023 13:45  COGNITION: Overall cognitive status: Within functional limits for tasks assessed  VISION (03/29/2024): Therapist performed visual field screen with pt appearing to have L homonomous hemianopsia - pt confirms he has loss of vision on L side.   SENSATION: Light touch: Impaired in L LE Proprioception: Impaired  in L LE  Can feel deep pressure in L LE   COORDINATION: Impaired in L LE due to paresis, hypertonia, and overall decreased flexibility  EDEMA:  Not formally assessed  MUSCLE TONE: LLE: Mild, Moderate, and Hypertonic  MUSCLE LENGTH: Not formally assessed; however, demonstrates L hamstring muscle tightness  DTRs:  Not formally assessed  POSTURE: rounded shoulders, forward head, and weight shift right  LOWER EXTREMITY ROM:       Right Eval Active ROM Left Eval Passive ROM  Hip flexion WFL Achieves at least 90  Hip extension  Hip abduction    Hip adduction    Hip internal rotation  Unable to achieve neutral hip rotation, lacking internal rotation  Hip external rotation  [facility] and rests in  excessive ER  Knee flexion [facility]  Knee extension WFL Able to achieve terminal knee extension in supine, but with significant discomfort due to hamstring tone  Ankle dorsiflexion WFL Did not remove AFO to assess this date  Ankle plantarflexion WFL Did not remove AFO to assess this date  Ankle inversion    Ankle eversion     (Blank rows = not tested)  LOWER EXTREMITY MMT:    MMT Right Eval Left Eval  Hip flexion 4+ 2-  (compensates with hip adductor activation)  Hip extension    Hip abduction    Hip adduction  3+  Hip internal rotation  2-  Hip external rotation  2  Knee flexion 4+ 1  Knee extension 4+ 3+  Ankle dorsiflexion 4+ Did not remove AFO to assess this date  Ankle plantarflexion 4+ Did not remove AFO to assess this date  Ankle inversion    Ankle eversion    (Blank rows = not tested)  Manual Muscle Test Scale 0/5 = No muscle contraction can be seen or felt 1/5 = Contraction can be felt, but there is no motion 2-/5 = Part moves through incomplete ROM w/ gravity decreased 2/5 = Part moves through complete ROM w/ gravity decreased 2+/5 = Part moves through incomplete ROM (<50%) against gravity or through complete ROM w/ gravity 3-/5 = Part moves through incomplete ROM (>50%) against gravity 3/5 = Part moves through complete ROM against gravity 3+/5 = Part moves through complete ROM against gravity/slight resistance 4-/5= Holds test position against slight to moderate pressure 4/5 = Part moves through complete ROM against gravity/moderate resistance 4+/5= Holds test position against moderate to strong pressure 5/5 = Part moves through complete ROM against gravity/full resistance  BED MOBILITY: (sleeps in regular bed, but has hospital bed available) Findings: Sit to supine CGA Supine to sit Min A and Mod A Rolling to Right Mod A Rolling to Left Min A and Mod A  TRANSFERS: Sit to stand: CGA and Min A  Assistive device utilized: Counselling psychologist     Stand to  sit: CGA and Min A  Assistive device utilized: Tree surgeon to chair: Min A  Assistive device utilized: Counselling psychologist       RAMP:  Not tested  CURB:  Not tested  STAIRS: Not tested *need to assess*  GAIT: Findings: advances L LE with excessive hip external rotation using hip adductors, step to pattern, decreased arm swing- Left, decreased step length- Right, decreased stance time- Left, decreased stride length, decreased hip/knee flexion- Left, decreased ankle dorsiflexion- Left, lateral lean- Right, and decreased trunk rotation Distance walked: ~55ft Assistive device utilized: small based quad cane in R UE  Level of assistance: CGA and Min A  FUNCTIONAL TESTS:  5 times sit to stand: need to assess Timed up and go (TUG): need to assess 6 minute walk test: need to assess 10 meter walk test: 0.46m/s using small based quad cane and CGA Berg Balance Scale: need to assess  PATIENT SURVEYS:  Stroke Impact Scale 03/15/2024: 49/80  TREATMENT DATE: 05/11/2024 Stand pivot transfer to chair with QC and CGA.   PT assisted pt in removal of R shoe and AFO to position foot in Motus Nova robotic ankle DF/PF trainer. Foot required to be angled due to LE position. Affecting overall    Pt performed composer x 5 rounds with improved ROM into DF on reach round progressing from 50% accuracy to 100%.   Completed 6 rounds of space tennis, but increased difficulty anticipating direction of ball and delated activation of ankle in both DF/PF. 6 rounds of sustained DF for 30-45 sec  with thermometer increased from 12 to 14.5 deg DF.   Pt reports feeling improved ROM in the R ankle and foot upon completion. Mod-max assist from PT to don AFO and R shoe.  Assisted with transfer to transport chair then to car and entrance of hospital with QC and supervision  assist   PATIENT EDUCATION: Education details: Therapy POC, LTGs, HEP, importance of improving L LE flexibility, findings during assessment  Benefits of high repetition training through robotic trainer. Person educated: Patient Education method: Explanation and Handouts Education comprehension: verbalized understanding and needs further education  HOME EXERCISE PROGRAM:  Access Code: [email or web address] URL: [email or web address] Date: 03/10/2024 Prepared by: Connell Kiss  Exercises - Seated Hip Adduction Isometrics with Ball  - 1 x daily - 7 x weekly - 2 sets - 10 reps - 5 seconds hold - Supine Quadricep Sets  - 1 x daily - 7 x weekly - 2 sets - 10 reps - 3 seconds hold   GOALS: Goals reviewed with patient? Yes  SHORT TERM GOALS: Target date: 04/21/2024  Patient will be independent in home exercise program to improve strength/mobility for better functional independence with ADLs.  Baseline: initiated on 03/10/2024 04/14/2024: continued Goal status: IN PROGRESS   LONG TERM GOALS: Target date: 06/02/2024   Patient (< 46 years old) will complete five times sit to stand (5XSTS) test in < 10 seconds indicating an increased LE strength and improved balance.  Baseline: 03/15/2024: 16.53 seconds from standard green chair relying on R hand to push-up from armrest and pt's personal SBQC nearby, but not using it VERSUS 25.85 seconds with R hand across chest, with min A for balance/safety without use of UE support 04/14/2024: 8.96 seconds using R UE support to push-up from armrest and pt's SBQC next to him, 13.75 seconds with R hand across chest with min A for therapist to stabilize chair behind him Goal status: IN PROGRESS  2.  Patient will increase Berg Balance score to > 45/56 to demonstrate improved balance and decreased fall risk during functional activities and ADLs.  Baseline: 03/15/2024: 14/56 04/14/2024: 22/56 Goal status: IN PROGRESS  3.  Patient will increase 10 meter walk  test to >1.55m/s using LRAD as to improve gait speed for better community ambulation and to reduce fall risk.  Baseline: 0.30 m/s using small based quad cane with CGA and 1x light min A due to balance instability  04/14/2024: 0.405 m/s using SBQC with close SBA for safety Goal status: IN PROGRESS  4.  Patient will increase six minute walk test distance to >550ft for progression towards community ambulator and improve gait ability  Baseline: 227ft with [facility]  04/20/2024:  218ft with SBQC and SBA Goal status: IN PROGRESS  5.  Patient will reduce timed up and go to <15 seconds to reduce fall risk and demonstrate improved transfer/gait ability. Baseline: 03/15/2024: 25.32 seconds using SBQC and chair with armrest as well as  CGA for safety/steadying 04/14/2024: 22.25 seconds using SBQC & chair w/ armrest and close SBA for safety Goal status: IN PROGRESS  6.  Patient will ascend/descend 4 stairs, using railing, independently without loss of balance to improve ability to get in/out of home.   Baseline: 03/31/2024: using R UE support on each HR with skilled min assist for balance using primarily step-to pattern 04/20/2024:  using R UE support on each HR with skilled CGA for safety using step-to pattern Goal status: IN PROGRESS  ASSESSMENT:  CLINICAL IMPRESSION:    Patient is a 48 y.o. male who was seen today for physical therapy treatment for L hemiplegia (UE>LE) with increased tone, impaired balance, impaired functional mobility, impaired gait, and increased fall risk following CVA in 2020. Mr. Ponciano is highly motivated to participate in therapy and improve his ability to walk. PT treatment focused on trial use of Motus Nova robotic training system for possible home use. Pt tolerated intervention well and was noted to have improved ankle DF ROM and activation upon completion. Pt is hoping to obtain system for home use.  Mr. Pietrzyk will benefit from further skilled PT to improve these deficits in order to  increase QOL, decrease fall risk, and ease/safety with ADLs and functional mobility.    OBJECTIVE IMPAIRMENTS: Abnormal gait, cardiopulmonary status limiting activity, decreased activity tolerance, decreased balance, decreased coordination, decreased endurance, decreased knowledge of use of DME, decreased mobility, difficulty walking, decreased ROM, decreased strength, hypomobility, impaired flexibility, impaired sensation, impaired tone, impaired UE functional use, improper body mechanics, postural dysfunction, and pain.   ACTIVITY LIMITATIONS: carrying, lifting, bending, standing, squatting, sleeping, stairs, transfers, bed mobility, continence, bathing, toileting, dressing, reach over head, hygiene/grooming, locomotion level, and caring for others  PARTICIPATION LIMITATIONS: meal prep, cleaning, laundry, medication management, shopping, community activity, and yard work  PERSONAL FACTORS: Age, Past/current experiences, Time since onset of injury/illness/exacerbation, Transportation, and 3+ comorbidities: HTN, hx of kidney stones, depression are also affecting patient's functional outcome.   REHAB POTENTIAL: Good  CLINICAL DECISION MAKING: Evolving/moderate complexity  EVALUATION COMPLEXITY: Moderate  PLAN:  PT FREQUENCY: 1-2x/week  PT DURATION: 12 weeks  PLANNED INTERVENTIONS: [id number]- PT Re-evaluation, 97750- Physical Performance Testing, 97110-Therapeutic exercises, 97530- Therapeutic activity, [id number]- Neuromuscular re-education, 97535- Self Care, [id number]- Manual therapy, [id number]- Gait training, [phone]- Orthotic/Prosthetic subsequent, [phone]- Canalith repositioning, [phone]- Electrical stimulation (manual), Patient/Family education, Balance training, Stair training, Taping, Dry Needling, Joint mobilization, Joint manipulation, Vestibular training, Visual/preceptual remediation/compensation, DME instructions, Cryotherapy, Moist heat, and Biofeedback  PLAN FOR NEXT SESSION:   - high intensity gait  training using +2 HHA vs litegait support vs Lofstrand crutch  - continue adding 7lb AW to L LE when appropriate vs decreasing RUE support/reliance while in litegait  - continue use of litegait harness in upcoming visits to decrease reliance on R UE support - L LE stretching and weightbearing for tone management and to improve alignment during gait/standing  - bridges with R foot placed on unstable surface to promote increased L LE wt bearing - stair navigation for L LE NMR - progress HEP - follow-up on when pt's next apt is for botox   Massie Viktoria PT, DPT  Physical Therapist - [facility]  [facility]  8:50 AM 05/12/24

## 2024-05-16 ENCOUNTER — Ambulatory Visit

## 2024-05-16 DIAGNOSIS — R2681 Unsteadiness on feet: Secondary | ICD-10-CM | POA: Diagnosis not present

## 2024-05-16 DIAGNOSIS — M6281 Muscle weakness (generalized): Secondary | ICD-10-CM

## 2024-05-16 DIAGNOSIS — I69354 Hemiplegia and hemiparesis following cerebral infarction affecting left non-dominant side: Secondary | ICD-10-CM

## 2024-05-16 DIAGNOSIS — R278 Other lack of coordination: Secondary | ICD-10-CM

## 2024-05-16 DIAGNOSIS — R29818 Other symptoms and signs involving the nervous system: Secondary | ICD-10-CM

## 2024-05-16 DIAGNOSIS — R2689 Other abnormalities of gait and mobility: Secondary | ICD-10-CM

## 2024-05-16 NOTE — Therapy (Signed)
 OUTPATIENT PHYSICAL THERAPY NEURO TREATMENT   Patient Name: Matthew Black MRN: [id number] DOB:June 11, 1976, 48 y.o., male Today's Date: 05/16/2024   PCP: Dorina Loving, PA-C REFERRING PROVIDER: Lorilee Sven SQUIBB, MD  END OF SESSION:   PT End of Session - 05/16/24 1824     Visit Number 16    Number of Visits 24    Date for PT Re-Evaluation 06/02/24    Authorization Type 4/17-7/10 for 24 PT visits    PT Start Time 1447    PT Stop Time 1530    PT Time Calculation (min) 43 min    Equipment Utilized During Treatment Gait belt;Other (comment)   L LE AFO   Activity Tolerance Patient tolerated treatment well    Behavior During Therapy [facility] for tasks assessed/performed             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   Stroke (HCC)    RICA, R A1, R MCA occlusion 07/19/19   Past Surgical History:  Procedure Laterality Date   CRANIOPLASTY Right 06/05/2020   Procedure: CRANIOPLASTY;  Surgeon: Lanis Pupa, MD;  Location: [facility] OR;  Service: Neurosurgery;  Laterality: Right;  right   CRANIOTOMY Right 07/19/2019   Procedure: RIGHT HEMI-CRANIECTOMY With implantation of skull flap to abdominal wall;  Surgeon: Lanis Pupa, MD;  Location: [facility] OR;  Service: Neurosurgery;  Laterality: Right;   CYST EXCISION N/A 10/08/2016   Procedure: EXCISION OF POSTERIOR NECK CYST;  Surgeon: Mitzie DELENA Freund, MD;  Location: [facility] ORS;  Service: General;  Laterality: N/A;   CYSTOSCOPY/URETEROSCOPY/HOLMIUM LASER/STENT PLACEMENT Right 03/16/2020   Procedure: CYSTOSCOPY RIGHT RETROGRADE PYELOGRAM  URETEROSCOPY/HOLMIUM LASER/STENT PLACEMENT;  Surgeon: Carolee Sherwood JONETTA DOUGLAS, MD;  Location: [facility] ORS;  Service: Urology;  Laterality: Right;   INCISION AND DRAINAGE ABSCESS N/A 09/22/2014   Procedure: INCISION AND DRAINAGE ABSCESS POSTERIOR NECK;  Surgeon: Donnice KATHEE Lunger, MD;  Location: [facility] ORS;  Service: General;  Laterality: N/A;   INCISION AND DRAINAGE ABSCESS N/A 12/20/2015   Procedure: INCISION AND DRAINAGE POSTERIOR NECK MASS;  Surgeon: Krystal Spinner, MD;  Location: [facility] ORS;  Service: General;  Laterality: N/A;   INCISION AND DRAINAGE ABSCESS Left 07/10/2004   middle finger   IR IVC FILTER PLMT / S&I /IMG GUID/MOD SED  07/21/2019   IR RADIOLOGIST EVAL & MGMT  12/14/2019   IR RADIOLOGIST EVAL & MGMT  12/21/2020   IR VENOGRAM RENAL UNI RIGHT  07/21/2019   MASS EXCISION N/A 07/21/2017   Procedure: EXCISION OF BENIGN NECK LESION WITH LAYERED CLOSURE;  Surgeon: Arelia Filippo, MD;  Location: [facility];  Service: Plastics;  Laterality: N/A;   MASS EXCISION N/A 11/10/2017   Procedure: EXCISION BENIGN LESION OF THE NECK WITH LAYERED CLOSURE;  Surgeon: Arelia Filippo, MD;  Location:  SURGERY CENTER;  Service: Plastics;  Laterality: N/A;   Patient Active Problem List   Diagnosis Date Noted   Hx of adenomatous colonic polyps 06/01/2023   COVID-19 virus infection 04/21/2021   Left-sided weakness 04/21/2021   S/P craniotomy  06/05/2020   History of cranioplasty 06/05/2020   Peri-rectal abscess 02/14/2020   Abnormal CT scan, pelvis 02/14/2020   Pancytopenia (HCC) 12/02/2019   Nephrolithiasis 12/02/2019   Hydronephrosis with renal and ureteral calculus obstruction 12/02/2019   Rectal pain 12/02/2019   Hyperkalemia 12/02/2019   Reactive depression    Wound infection after surgery    Sleep disturbance    Dysphagia, post-stroke    Transaminitis    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   Cerebral abscess    Urinary tract infection without hematuria    Altered  mental status    Primary hypercoagulable state (HCC)    Acute pulmonary embolism without acute cor pulmonale (HCC)    Deep vein thrombosis (DVT) of non-extremity vein    Hypokalemia    Acute blood loss anemia    Leukocytosis    Endotracheal tube present    Acute respiratory failure with hypoxemia (HCC)    Stroke (cerebrum) (HCC) 07/19/2019   Pressure injury of skin 07/19/2019   Acute CVA (cerebrovascular accident) (HCC)    Encephalopathy    Dysphagia    Acute encephalopathy    Essential hypertension    Obesity 03/12/2016   Scalp abscess 12/20/2015   Neck abscess 12/20/2015   Pilonidal cyst 02/08/2013    ONSET DATE: stroke in August 2020  REFERRING DIAG: I63.9 (ICD-10-CM) - Cerebrovascular accident (CVA), unspecified mechanism (HCC)   THERAPY DIAG:   Muscle weakness (generalized)  Other lack of coordination  Other abnormalities of gait and mobility  Unsteadiness on feet  Hemiplegia and hemiparesis following cerebral infarction affecting left non-dominant side (HCC)  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)  Other symptoms and signs involving the nervous system   Rationale for Evaluation and Treatment: Rehabilitation  SUBJECTIVE:                                                                                                                                                                                             SUBJECTIVE STATEMENT: Pt reports doing well overall.Did not sleep well last night due to some personal concerns about his son leading to a lot of fatigue for today's session.  Initial Eval: Patient reports his goal is to walk without using a cane and without assistance. Patient states he wants to walk like he did before having the stroke. Reports his stroke was in August of 2020. States he currently uses a quad cane for ambulation and tries to walk as much as possible. Reports he has a manual wheelchair, but  states he tries not to use it because it depresses him  and makes him feel like he is back to when he first had his stroke. Reports wearing L LE custom AFO at all times.   Reports he is currently doing everything mod-I, but states he could call his mom, dad, or brothers if he needed assistance.  Reports he has routine botox  injections in L UE to help manage tone, but doesn't receive any in his LE.  Reports he has a hospital bed, but sleeps in a regular bed.  Pt accompanied by: self  PERTINENT HISTORY: L hemiparesis s/p R ACA and MCA territory CVA in August 2020, HTN, hx of kidney stones, depression  Per Neurology MD note on 11/24/2023: 72-year-old African-American male with large right hemispheric infarct due to right internal carotid and middle cerebral artery occlusion with cytotoxic edema and brain herniation s/p hemicraniectomy in 06/2019 is doing reasonably well with residual left spastic hemiparesis and left hemianopia.  Etiology of carotid occlusion unclear possibly dissection.  He had a prolonged hospital admission with several complications including DVT, pulmonary embolism, hemorrhagic transformation, abdominal wall hematoma, UTI but made quite remarkable recovery   PAIN:  Are you having pain? No, denies pain today, but states sometimes will have L shoulder pain  PRECAUTIONS: Fall  RED FLAGS: None   WEIGHT BEARING RESTRICTIONS: No  FALLS: Has patient fallen in last 6 months? Yes, stating he fell down his stairs ~5 months ago and is now scared to perform stair navigation  LIVING ENVIRONMENT: Lives with: lives alone Lives in: House/apartment Stairs: Yes: Internal: flight steps; can reach both and External: 4 steps; can reach both, but states he doesn't have to go upstairs Has following equipment at home: Quad cane small base, Hemi walker, Wheelchair (manual), shower chair, Shower bench, and L LE custom AFO Pt has custom K5 wheelchair with rigid, contoured back support and Roho  cushion. Pt reports he calls [employer] to come adjust his seat cushion when he feels it is getting uncomfortable.  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  DIAGNOSTIC FINDINGS:  EXAM: CT ANGIOGRAPHY HEAD AND NECK WITH AND WITHOUT CONTRAST IMPRESSION: 1. No acute intracranial abnormality. 2. Chronically occluded right ICA with minimal opacification of the right MCA and ACA branches. 3. Redemonstrated extensive encephalomalacia in the right MCA and ACA territories secondary to a prior infarct.  Electronically Signed   By: Lyndall Gore M.D.   On: 11/27/2023 13:45  COGNITION: Overall cognitive status: Within functional limits for tasks assessed  VISION (03/29/2024): Therapist performed visual field screen with pt appearing to have L homonomous hemianopsia - pt confirms he has loss of vision on L side.   SENSATION: Light touch: Impaired in L LE Proprioception: Impaired  in L LE  Can feel deep pressure in L LE   COORDINATION: Impaired in L LE due to paresis, hypertonia, and overall decreased flexibility  EDEMA:  Not formally assessed  MUSCLE TONE: LLE: Mild, Moderate, and Hypertonic  MUSCLE LENGTH: Not formally assessed; however, demonstrates L hamstring muscle tightness  DTRs:  Not formally assessed  POSTURE: rounded shoulders, forward head, and weight shift right  LOWER EXTREMITY ROM:       Right  Eval Active ROM Left Eval Passive ROM  Hip flexion WFL Achieves at least 90  Hip extension    Hip abduction    Hip  adduction    Hip internal rotation  Unable to achieve neutral hip rotation, lacking internal rotation  Hip external rotation  [facility] and rests in excessive ER  Knee flexion [facility]  Knee extension WFL Able to achieve terminal knee extension in supine, but with significant discomfort due to hamstring tone  Ankle dorsiflexion WFL Did not remove AFO to assess this date  Ankle plantarflexion WFL Did not remove AFO to assess this date  Ankle inversion    Ankle eversion     (Blank rows = not tested)  LOWER EXTREMITY MMT:    MMT Right Eval Left Eval  Hip flexion 4+ 2-  (compensates with hip adductor activation)  Hip extension    Hip abduction    Hip adduction  3+  Hip internal rotation  2-  Hip external rotation  2  Knee flexion 4+ 1  Knee extension 4+ 3+  Ankle dorsiflexion 4+ Did not remove AFO to assess this date  Ankle plantarflexion 4+ Did not remove AFO to assess this date  Ankle inversion    Ankle eversion    (Blank rows = not tested)  Manual Muscle Test Scale 0/5 = No muscle contraction can be seen or felt 1/5 = Contraction can be felt, but there is no motion 2-/5 = Part moves through incomplete ROM w/ gravity decreased 2/5 = Part moves through complete ROM w/ gravity decreased 2+/5 = Part moves through incomplete ROM (<50%) against gravity or through complete ROM w/ gravity 3-/5 = Part moves through incomplete ROM (>50%) against gravity 3/5 = Part moves through complete ROM against gravity 3+/5 = Part moves through complete ROM against gravity/slight resistance 4-/5= Holds test position against slight to moderate pressure 4/5 = Part moves through complete ROM against gravity/moderate resistance 4+/5= Holds test position against moderate to strong pressure 5/5 = Part moves through complete ROM against gravity/full resistance  BED MOBILITY: (sleeps in regular bed, but has hospital bed available) Findings: Sit to supine CGA Supine to sit Min A and Mod A Rolling to Right  Mod A Rolling to Left Min A and Mod A  TRANSFERS: Sit to stand: CGA and Min A  Assistive device utilized: Counselling psychologist     Stand to sit: CGA and Min A  Assistive device utilized: Tree surgeon to chair: Min A  Assistive device utilized: Counselling psychologist       RAMP:  Not tested  CURB:  Not tested  STAIRS: Not tested *need to assess*  GAIT: Findings: advances L LE with excessive hip external rotation using hip adductors, step to pattern, decreased arm swing- Left, decreased step length- Right, decreased stance time- Left, decreased stride length, decreased hip/knee flexion- Left, decreased ankle dorsiflexion- Left, lateral lean- Right, and decreased trunk rotation Distance walked: ~80ft Assistive device utilized: small based quad cane in R UE  Level of assistance: CGA and Min A  FUNCTIONAL TESTS:  5 times sit to stand: need to assess Timed up and go (TUG): need to assess 6 minute walk test: need to assess 10 meter walk test: 0.32m/s using small based quad cane and CGA Berg Balance Scale: need to assess  PATIENT SURVEYS:  Stroke Impact Scale 03/15/2024: 49/80  TREATMENT DATE: 05/16/2024  Gait training ~143ft into therapy clinic using [facility] with close SBA for safety. Pt demos more consistent step-through pattern; however, L LE remains excessively externally rotated (ER) but improving as well as increasing L stance time and slow, but improving gait speed.    Donned litegait harness  Pt only tolerating 4x70' using lite gait today with x2 seated rest breaks needed throughout session due to increased fatigue. Pt with diminished LLE step lengths leading to minimal step through pattern despite minA from PT facilitating step lengths and foot clearance. PT also assisting in LLE foot placement at initial contact working on neutral foot/hip  positioning to reduce excessive hip ER. Intermittent minA on lite gait for managing equipment primarily with turns.   PATIENT EDUCATION: Education details: Therapy POC, LTGs, HEP, importance of improving L LE flexibility, findings during assessment  Person educated: Patient Education method: Explanation and Handouts Education comprehension: verbalized understanding and needs further education  HOME EXERCISE PROGRAM:  Access Code: [email or web address] URL: [email or web address] Date: 03/10/2024 Prepared by: Connell Kiss  Exercises - Seated Hip Adduction Isometrics with Ball  - 1 x daily - 7 x weekly - 2 sets - 10 reps - 5 seconds hold - Supine Quadricep Sets  - 1 x daily - 7 x weekly - 2 sets - 10 reps - 3 seconds hold   GOALS: Goals reviewed with patient? Yes  SHORT TERM GOALS: Target date: 04/21/2024  Patient will be independent in home exercise program to improve strength/mobility for better functional independence with ADLs.  Baseline: initiated on 03/10/2024 04/14/2024: continued Goal status: IN PROGRESS   LONG TERM GOALS: Target date: 06/02/2024   Patient (< 52 years old) will complete five times sit to stand (5XSTS) test in < 10 seconds indicating an increased LE strength and improved balance.  Baseline: 03/15/2024: 16.53 seconds from standard green chair relying on R hand to push-up from armrest and pt's personal SBQC nearby, but not using it VERSUS 25.85 seconds with R hand across chest, with min A for balance/safety without use of UE support 04/14/2024: 8.96 seconds using R UE support to push-up from armrest and pt's SBQC next to him, 13.75 seconds with R hand across chest with min A for therapist to stabilize chair behind him Goal status: IN PROGRESS  2.  Patient will increase Berg Balance score to > 45/56 to demonstrate improved balance and decreased fall risk during functional activities and ADLs.  Baseline: 03/15/2024: 14/56 04/14/2024: 22/56 Goal status: IN  PROGRESS  3.  Patient will increase 10 meter walk test to >1.26m/s using LRAD as to improve gait speed for better community ambulation and to reduce fall risk.  Baseline: 0.30 m/s using small based quad cane with CGA and 1x light min A due to balance instability  04/14/2024: 0.405 m/s using SBQC with close SBA for safety Goal status: IN PROGRESS  4.  Patient will increase six minute walk test distance to >546ft for progression towards community ambulator and improve gait ability  Baseline: 218ft with [facility]  04/20/2024:  245ft with SBQC and SBA Goal status: IN PROGRESS  5.  Patient will reduce timed up and go to <15 seconds to reduce fall risk and demonstrate improved transfer/gait ability. Baseline: 03/15/2024: 25.32 seconds using SBQC and chair with armrest as well as CGA for safety/steadying 04/14/2024: 22.25 seconds using SBQC & chair w/ armrest and close SBA for safety Goal status: IN PROGRESS  6.  Patient will ascend/descend 4 stairs, using railing, independently without loss of  balance to improve ability to get in/out of home.   Baseline: 03/31/2024: using R UE support on each HR with skilled min assist for balance using primarily step-to pattern 04/20/2024:  using R UE support on each HR with skilled CGA for safety using step-to pattern Goal status: IN PROGRESS  ASSESSMENT:  CLINICAL IMPRESSION:    Continuing PT POC address gait impairments from CVA affecting LLE. Overall gait training tolerance greatly diminished from prior sessions due to fatigue from lack of sleep last night. Pt remains highly motivated throughout session to perform to his current capabilities for today. Pt continues to need support with LLE in swing phase and initial contacts of gait cycle for step through pattern. Pt will benefit from further skilled PT to improve these deficits in order to increase QOL, decrease fall risk, and ease/safety with ADLs and functional mobility.    OBJECTIVE IMPAIRMENTS: Abnormal gait,  cardiopulmonary status limiting activity, decreased activity tolerance, decreased balance, decreased coordination, decreased endurance, decreased knowledge of use of DME, decreased mobility, difficulty walking, decreased ROM, decreased strength, hypomobility, impaired flexibility, impaired sensation, impaired tone, impaired UE functional use, improper body mechanics, postural dysfunction, and pain.   ACTIVITY LIMITATIONS: carrying, lifting, bending, standing, squatting, sleeping, stairs, transfers, bed mobility, continence, bathing, toileting, dressing, reach over head, hygiene/grooming, locomotion level, and caring for others  PARTICIPATION LIMITATIONS: meal prep, cleaning, laundry, medication management, shopping, community activity, and yard work  PERSONAL FACTORS: Age, Past/current experiences, Time since onset of injury/illness/exacerbation, Transportation, and 3+ comorbidities: HTN, hx of kidney stones, depression are also affecting patient's functional outcome.   REHAB POTENTIAL: Good  CLINICAL DECISION MAKING: Evolving/moderate complexity  EVALUATION COMPLEXITY: Moderate  PLAN:  PT FREQUENCY: 1-2x/week  PT DURATION: 12 weeks  PLANNED INTERVENTIONS: 97164- PT Re-evaluation, 97750- Physical Performance Testing, 97110-Therapeutic exercises, 97530- Therapeutic activity, [id number]- Neuromuscular re-education, 97535- Self Care, [id number]- Manual therapy, [id number]- Gait training, [phone]- Orthotic/Prosthetic subsequent, [phone]- Canalith repositioning, [phone]- Electrical stimulation (manual), Patient/Family education, Balance training, Stair training, Taping, Dry Needling, Joint mobilization, Joint manipulation, Vestibular training, Visual/preceptual remediation/compensation, DME instructions, Cryotherapy, Moist heat, and Biofeedback  PLAN FOR NEXT SESSION:  - trial Motus Nova Foot device during PT on June 18th - high intensity gait training using +2 HHA vs litegait support vs Lofstrand crutch  - continue  adding 7lb AW to L LE when appropriate vs decreasing RUE support/reliance while in litegait  - continue use of litegait harness in upcoming visits to decrease reliance on R UE support - L LE stretching and weightbearing for tone management and to improve alignment during gait/standing  - bridges with R foot placed on unstable surface to promote increased L LE wt bearing - stair navigation for L LE NMR - progress HEP - follow-up on when pt's next apt is for botox    [employer]. Fairly IV, PT, DPT Physical Therapist- [place]  [facility] 6:35 PM 05/16/24

## 2024-05-18 ENCOUNTER — Ambulatory Visit

## 2024-05-18 DIAGNOSIS — M6281 Muscle weakness (generalized): Secondary | ICD-10-CM | POA: Diagnosis not present

## 2024-05-18 DIAGNOSIS — R29818 Other symptoms and signs involving the nervous system: Secondary | ICD-10-CM | POA: Diagnosis not present

## 2024-05-18 DIAGNOSIS — R278 Other lack of coordination: Secondary | ICD-10-CM

## 2024-05-18 DIAGNOSIS — I69354 Hemiplegia and hemiparesis following cerebral infarction affecting left non-dominant side: Secondary | ICD-10-CM

## 2024-05-18 DIAGNOSIS — R2689 Other abnormalities of gait and mobility: Secondary | ICD-10-CM | POA: Diagnosis not present

## 2024-05-18 DIAGNOSIS — R2681 Unsteadiness on feet: Secondary | ICD-10-CM | POA: Diagnosis not present

## 2024-05-18 NOTE — Therapy (Signed)
 OUTPATIENT PHYSICAL THERAPY NEURO TREATMENT   Patient Name: Matthew Black MRN: [id number] DOB:[birth year], 48 y.o., male Today's Date: 05/18/2024   PCP: Dorina Loving, PA-C REFERRING PROVIDER: Lorilee Sven SQUIBB, MD  END OF SESSION:   PT End of Session - 05/18/24 1327     Visit Number 17    Number of Visits 24    Date for PT Re-Evaluation 06/02/24    Authorization Type 4/17-7/10 for 24 PT visits    PT Start Time 1321    PT Stop Time 1400    PT Time Calculation (min) 39 min    Equipment Utilized During Treatment Gait belt;Other (comment)   L LE AFO   Activity Tolerance Patient tolerated treatment well    Behavior During Therapy [facility] for tasks assessed/performed              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   Stroke (HCC)    RICA, R A1, R MCA occlusion 07/19/19   Past Surgical History:  Procedure Laterality Date   CRANIOPLASTY Right 06/05/2020   Procedure: CRANIOPLASTY;  Surgeon: Lanis Pupa, MD;  Location: [facility] OR;  Service: Neurosurgery;  Laterality: Right;  right   CRANIOTOMY Right 07/19/2019   Procedure: RIGHT HEMI-CRANIECTOMY With implantation of skull flap to abdominal wall;  Surgeon: Lanis Pupa, MD;  Location: [facility] OR;  Service: Neurosurgery;  Laterality: Right;   CYST EXCISION N/A 10/08/2016   Procedure: EXCISION OF POSTERIOR NECK CYST;  Surgeon: Mitzie DELENA Freund, MD;  Location: [facility] ORS;  Service: General;  Laterality: N/A;   CYSTOSCOPY/URETEROSCOPY/HOLMIUM LASER/STENT PLACEMENT Right 03/16/2020   Procedure: CYSTOSCOPY RIGHT RETROGRADE  PYELOGRAM URETEROSCOPY/HOLMIUM LASER/STENT PLACEMENT;  Surgeon: Carolee Sherwood JONETTA DOUGLAS, MD;  Location: [facility] ORS;  Service: Urology;  Laterality: Right;   INCISION AND DRAINAGE ABSCESS N/A 09/22/2014   Procedure: INCISION AND DRAINAGE ABSCESS POSTERIOR NECK;  Surgeon: Donnice KATHEE Lunger, MD;  Location: [facility] ORS;  Service: General;  Laterality: N/A;   INCISION AND DRAINAGE ABSCESS N/A 12/20/2015   Procedure: INCISION AND DRAINAGE POSTERIOR NECK MASS;  Surgeon: Krystal Spinner, MD;  Location: [facility] ORS;  Service: General;  Laterality: N/A;   INCISION AND DRAINAGE ABSCESS Left 07/10/2004   middle finger   IR IVC FILTER PLMT / S&I /IMG GUID/MOD SED  07/21/2019   IR RADIOLOGIST EVAL & MGMT  12/14/2019   IR RADIOLOGIST EVAL & MGMT  12/21/2020   IR VENOGRAM RENAL UNI RIGHT  07/21/2019   MASS EXCISION N/A 07/21/2017   Procedure: EXCISION OF BENIGN NECK LESION WITH LAYERED CLOSURE;  Surgeon: Arelia Filippo, MD;  Location: [facility];  Service: Plastics;  Laterality: N/A;   MASS EXCISION N/A 11/10/2017   Procedure: EXCISION BENIGN LESION OF THE NECK WITH LAYERED CLOSURE;  Surgeon: Arelia Filippo, MD;  Location: [facility];  Service: Plastics;  Laterality: N/A;   Patient Active Problem List   Diagnosis Date Noted   Hx of adenomatous colonic polyps 06/01/2023   COVID-19 virus infection 04/21/2021   Left-sided weakness 04/21/2021   S/P  craniotomy 06/05/2020   History of cranioplasty 06/05/2020   Peri-rectal abscess 02/14/2020   Abnormal CT scan, pelvis 02/14/2020   Pancytopenia (HCC) 12/02/2019   Nephrolithiasis 12/02/2019   Hydronephrosis with renal and ureteral calculus obstruction 12/02/2019   Rectal pain 12/02/2019   Hyperkalemia 12/02/2019   Reactive depression    Wound infection after surgery    Sleep disturbance    Dysphagia, post-stroke    Transaminitis    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   Cerebral abscess    Urinary tract infection without hematuria     Altered mental status    Primary hypercoagulable state (HCC)    Acute pulmonary embolism without acute cor pulmonale (HCC)    Deep vein thrombosis (DVT) of non-extremity vein    Hypokalemia    Acute blood loss anemia    Leukocytosis    Endotracheal tube present    Acute respiratory failure with hypoxemia (HCC)    Stroke (cerebrum) (HCC) 07/19/2019   Pressure injury of skin 07/19/2019   Acute CVA (cerebrovascular accident) (HCC)    Encephalopathy    Dysphagia    Acute encephalopathy    Essential hypertension    Obesity 03/12/2016   Scalp abscess 12/20/2015   Neck abscess 12/20/2015   Pilonidal cyst 02/08/2013    ONSET DATE: stroke in August 2020  REFERRING DIAG: I63.9 (ICD-10-CM) - Cerebrovascular accident (CVA), unspecified mechanism (HCC)   THERAPY DIAG:   Muscle weakness (generalized)  Other lack of coordination  Other abnormalities of gait and mobility  Unsteadiness on feet  Hemiplegia and hemiparesis following cerebral infarction affecting left non-dominant side (HCC)  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)  Other symptoms and signs involving the nervous system   Rationale for Evaluation and Treatment: Rehabilitation  SUBJECTIVE:                                                                                                                                                                                             SUBJECTIVE STATEMENT: Patient reports wanting to wait until his primary PT -Carly is back before using litegait to walk again. Reports doing okay otherwise. States he would like to work on his strength and stamina to improve his standing.   Initial Eval: Patient reports his goal is to walk without using a cane and without assistance. Patient states he wants to walk like he did before having the stroke. Reports his stroke was in August of 2020. States he currently uses a quad cane for ambulation and  tries to walk as much as possible. Reports he has a manual wheelchair,  but states he tries not to use it because it depresses him and makes him feel like he is back to when he first had his stroke. Reports wearing L LE custom AFO at all times.   Reports he is currently doing everything mod-I, but states he could call his mom, dad, or brothers if he needed assistance.  Reports he has routine botox  injections in L UE to help manage tone, but doesn't receive any in his LE.  Reports he has a hospital bed, but sleeps in a regular bed.  Pt accompanied by: self  PERTINENT HISTORY: L hemiparesis s/p R ACA and MCA territory CVA in August 2020, HTN, hx of kidney stones, depression  Per Neurology MD note on 11/24/2023: 64-year-old African-American male with large right hemispheric infarct due to right internal carotid and middle cerebral artery occlusion with cytotoxic edema and brain herniation s/p hemicraniectomy in 06/2019 is doing reasonably well with residual left spastic hemiparesis and left hemianopia.  Etiology of carotid occlusion unclear possibly dissection.  He had a prolonged hospital admission with several complications including DVT, pulmonary embolism, hemorrhagic transformation, abdominal wall hematoma, UTI but made quite remarkable recovery   PAIN:  Are you having pain? No, denies pain today, but states sometimes will have L shoulder pain  PRECAUTIONS: Fall  RED FLAGS: None   WEIGHT BEARING RESTRICTIONS: No  FALLS: Has patient fallen in last 6 months? Yes, stating he fell down his stairs ~5 months ago and is now scared to perform stair navigation  LIVING ENVIRONMENT: Lives with: lives alone Lives in: House/apartment Stairs: Yes: Internal: flight steps; can reach both and External: 4 steps; can reach both, but states he doesn't have to go upstairs Has following equipment at home: Quad cane small base, Hemi walker, Wheelchair (manual), shower chair, Shower bench, and L LE custom  AFO Pt has custom K5 wheelchair with rigid, contoured back support and Roho cushion. Pt reports he calls [employer] to come adjust his seat cushion when he feels it is getting uncomfortable.  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  DIAGNOSTIC FINDINGS:  EXAM: CT ANGIOGRAPHY HEAD AND NECK WITH AND WITHOUT CONTRAST IMPRESSION: 1. No acute intracranial abnormality. 2. Chronically occluded right ICA with minimal opacification of the right MCA and ACA branches. 3. Redemonstrated extensive encephalomalacia in the right MCA and ACA territories secondary to a prior infarct.  Electronically Signed   By: Lyndall Gore M.D.   On: 11/27/2023 13:45  COGNITION: Overall cognitive status: Within functional limits for tasks assessed  VISION (03/29/2024): Therapist performed visual field screen with pt appearing to have L homonomous hemianopsia - pt confirms he has loss of vision on L side.   SENSATION: Light touch: Impaired in L LE Proprioception: Impaired  in L LE  Can feel deep pressure in L LE   COORDINATION: Impaired in L LE due to paresis, hypertonia, and overall decreased flexibility  EDEMA:  Not formally assessed  MUSCLE TONE: LLE: Mild, Moderate, and Hypertonic  MUSCLE LENGTH: Not formally assessed; however, demonstrates L hamstring muscle tightness  DTRs:  Not formally assessed  POSTURE: rounded shoulders, forward head, and weight shift  right  LOWER EXTREMITY ROM:       Right Eval Active ROM Left Eval Passive ROM  Hip  flexion WFL Achieves at least 90  Hip extension    Hip abduction    Hip adduction    Hip internal rotation  Unable to achieve neutral hip rotation, lacking internal rotation  Hip external rotation  [facility] and rests in excessive ER  Knee flexion [facility]  Knee extension WFL Able to achieve terminal knee extension in supine, but with significant discomfort due to hamstring tone  Ankle dorsiflexion WFL Did not remove AFO to assess this date  Ankle plantarflexion WFL Did not remove AFO to assess this date  Ankle inversion    Ankle eversion     (Blank rows = not tested)  LOWER EXTREMITY MMT:    MMT Right Eval Left Eval  Hip flexion 4+ 2-  (compensates with hip adductor activation)  Hip extension    Hip abduction    Hip adduction  3+  Hip internal rotation  2-  Hip external rotation  2  Knee flexion 4+ 1  Knee extension 4+ 3+  Ankle dorsiflexion 4+ Did not remove AFO to assess this date  Ankle plantarflexion 4+ Did not remove AFO to assess this date  Ankle inversion    Ankle eversion    (Blank rows = not tested)  Manual Muscle Test Scale 0/5 = No muscle contraction can be seen or felt 1/5 = Contraction can be felt, but there is no motion 2-/5 = Part moves through incomplete ROM w/ gravity decreased 2/5 = Part moves through complete ROM w/ gravity decreased 2+/5 = Part moves through incomplete ROM (<50%) against gravity or through complete ROM w/ gravity 3-/5 = Part moves through incomplete ROM (>50%) against gravity 3/5 = Part moves through complete ROM against gravity 3+/5 = Part moves through complete ROM against gravity/slight resistance 4-/5= Holds test position against slight to moderate pressure 4/5 = Part moves through complete ROM against gravity/moderate resistance 4+/5= Holds test position against moderate to strong pressure 5/5 = Part moves through complete ROM against gravity/full resistance  BED MOBILITY: (sleeps in regular bed, but has hospital bed  available) Findings: Sit to supine CGA Supine to sit Min A and Mod A Rolling to Right Mod A Rolling to Left Min A and Mod A  TRANSFERS: Sit to stand: CGA and Min A  Assistive device utilized: Counselling psychologist     Stand to sit: CGA and Min A  Assistive device utilized: Tree surgeon to chair: Min A  Assistive device utilized: Counselling psychologist       RAMP:  Not tested  CURB:  Not tested  STAIRS: Not tested *need to assess*  GAIT: Findings: advances L LE with excessive hip external rotation using hip adductors, step to pattern, decreased arm swing- Left, decreased step length- Right, decreased stance time- Left, decreased stride length, decreased hip/knee flexion- Left, decreased ankle dorsiflexion- Left, lateral lean- Right, and decreased trunk rotation Distance walked: ~40ft Assistive device utilized: small based quad cane in R UE  Level of assistance: CGA and Min A  FUNCTIONAL TESTS:  5 times sit to stand: need to assess Timed up and go (TUG): need to assess 6 minute walk test: need to assess 10 meter walk test: 0.31m/s using small based quad cane and CGA Berg Balance Scale: need to assess  PATIENT SURVEYS:  Stroke Impact Scale 03/15/2024: 49/80  TREATMENT DATE: 05/18/2024  Gait training- in // bars today focusing on reciprocal steps ~170ft into t. Pt demos more consistent step-through pattern; however, L LE remains excessively externally rotated (ER) but improving as well as increasing L stance time and slow, but improving gait speed.    TA:  Standing terminal knee ext Standing resistive hip ext Sit to stand  TE:  Seated hip march Seated LAQ Assisted LAQ with eccentric hold  Active heel slide into LE kick out Seated hip add squeeze with manual resistance Seated hip abd with manual resistance.   PATIENT  EDUCATION: Education details: Therapy POC, LTGs, HEP, importance of improving L LE flexibility, findings during assessment  Person educated: Patient Education method: Explanation and Handouts Education comprehension: verbalized understanding and needs further education  HOME EXERCISE PROGRAM:  Access Code: [email or web address] URL: [email or web address] Date: 03/10/2024 Prepared by: Connell Kiss  Exercises - Seated Hip Adduction Isometrics with Ball  - 1 x daily - 7 x weekly - 2 sets - 10 reps - 5 seconds hold - Supine Quadricep Sets  - 1 x daily - 7 x weekly - 2 sets - 10 reps - 3 seconds hold   GOALS: Goals reviewed with patient? Yes  SHORT TERM GOALS: Target date: 04/21/2024  Patient will be independent in home exercise program to improve strength/mobility for better functional independence with ADLs.  Baseline: initiated on 03/10/2024 04/14/2024: continued Goal status: IN PROGRESS   LONG TERM GOALS: Target date: 06/02/2024   Patient (< 44 years old) will complete five times sit to stand (5XSTS) test in < 10 seconds indicating an increased LE strength and improved balance.  Baseline: 03/15/2024: 16.53 seconds from standard green chair relying on R hand to push-up from armrest and pt's personal SBQC nearby, but not using it VERSUS 25.85 seconds with R hand across chest, with min A for balance/safety without use of UE support 04/14/2024: 8.96 seconds using R UE support to push-up from armrest and pt's SBQC next to him, 13.75 seconds with R hand across chest with min A for therapist to stabilize chair behind him Goal status: IN PROGRESS  2.  Patient will increase Berg Balance score to > 45/56 to demonstrate improved balance and decreased fall risk during functional activities and ADLs.  Baseline: 03/15/2024: 14/56 04/14/2024: 22/56 Goal status: IN PROGRESS  3.  Patient will increase 10 meter walk test to >1.19m/s using LRAD as to improve gait speed for better community ambulation  and to reduce fall risk.  Baseline: 0.30 m/s using small based quad cane with CGA and 1x light min A due to balance instability  04/14/2024: 0.405 m/s using SBQC with close SBA for safety Goal status: IN PROGRESS  4.  Patient will increase six minute walk test distance to >512ft for progression towards community ambulator and improve gait ability  Baseline: 29ft with [facility]  04/20/2024:  226ft with SBQC and SBA Goal status: IN PROGRESS  5.  Patient will reduce timed up and go to <15 seconds to reduce fall risk and demonstrate improved transfer/gait ability. Baseline: 03/15/2024: 25.32 seconds using SBQC and chair with armrest as well as CGA for safety/steadying 04/14/2024: 22.25 seconds using SBQC & chair w/ armrest and close SBA for safety Goal status: IN PROGRESS  6.  Patient will ascend/descend 4 stairs, using railing, independently without loss of balance to improve ability to get in/out of home.   Baseline: 03/31/2024: using R UE support on each HR with skilled min assist for balance using primarily step-to pattern  04/20/2024:  using R UE support on each HR with skilled CGA for safety using step-to pattern Goal status: IN PROGRESS  ASSESSMENT:  CLINICAL IMPRESSION:    Treatment pivoted from original plan of gait with litegait as patient requested to wait until his primary PT is back. Treatment focused on some gait and therapeutic activities in // bars and ended with some basic review of seated LE strengthening that he can perform at home. Overall he was challenged today with weakness as limiting factor and some difficulty returning demonstration of instructed tasks with TKE and Hip ext. He fatigued quickly today with standing- yet at end of session able to demo more reciprocal steps forward and backward.  Pt will benefit from further skilled PT to improve these deficits in order to increase QOL, decrease fall risk, and ease/safety with ADLs and functional mobility.    OBJECTIVE IMPAIRMENTS:  Abnormal gait, cardiopulmonary status limiting activity, decreased activity tolerance, decreased balance, decreased coordination, decreased endurance, decreased knowledge of use of DME, decreased mobility, difficulty walking, decreased ROM, decreased strength, hypomobility, impaired flexibility, impaired sensation, impaired tone, impaired UE functional use, improper body mechanics, postural dysfunction, and pain.   ACTIVITY LIMITATIONS: carrying, lifting, bending, standing, squatting, sleeping, stairs, transfers, bed mobility, continence, bathing, toileting, dressing, reach over head, hygiene/grooming, locomotion level, and caring for others  PARTICIPATION LIMITATIONS: meal prep, cleaning, laundry, medication management, shopping, community activity, and yard work  PERSONAL FACTORS: Age, Past/current experiences, Time since onset of injury/illness/exacerbation, Transportation, and 3+ comorbidities: HTN, hx of kidney stones, depression are also affecting patient's functional outcome.   REHAB POTENTIAL: Good  CLINICAL DECISION MAKING: Evolving/moderate complexity  EVALUATION COMPLEXITY: Moderate  PLAN:  PT FREQUENCY: 1-2x/week  PT DURATION: 12 weeks  PLANNED INTERVENTIONS: [id number]- PT Re-evaluation, 97750- Physical Performance Testing, 97110-Therapeutic exercises, 97530- Therapeutic activity, [id number]- Neuromuscular re-education, 97535- Self Care, [id number]- Manual therapy, [id number]- Gait training, [phone]- Orthotic/Prosthetic subsequent, [phone]- Canalith repositioning, [phone]- Electrical stimulation (manual), Patient/Family education, Balance training, Stair training, Taping, Dry Needling, Joint mobilization, Joint manipulation, Vestibular training, Visual/preceptual remediation/compensation, DME instructions, Cryotherapy, Moist heat, and Biofeedback  PLAN FOR NEXT SESSION:  - high intensity gait training using +2 HHA vs litegait support vs Lofstrand crutch  - continue adding 7lb AW to L LE when appropriate  vs decreasing RUE support/reliance while in litegait  - continue use of litegait harness in upcoming visits to decrease reliance on R UE support - L LE stretching and weightbearing for tone management and to improve alignment during gait/standing  - bridges with R foot placed on unstable surface to promote increased L LE wt bearing - stair navigation for L LE NMR - progress HEP - follow-up on when pt's next apt is for botox    Chyrl London, PT Physical Therapist- [facility] 2:29 PM 05/18/24

## 2024-05-23 ENCOUNTER — Ambulatory Visit: Admitting: Physical Therapy

## 2024-05-23 DIAGNOSIS — I69354 Hemiplegia and hemiparesis following cerebral infarction affecting left non-dominant side: Secondary | ICD-10-CM | POA: Diagnosis not present

## 2024-05-23 DIAGNOSIS — R29818 Other symptoms and signs involving the nervous system: Secondary | ICD-10-CM

## 2024-05-23 DIAGNOSIS — M6281 Muscle weakness (generalized): Secondary | ICD-10-CM | POA: Diagnosis not present

## 2024-05-23 DIAGNOSIS — R2689 Other abnormalities of gait and mobility: Secondary | ICD-10-CM

## 2024-05-23 DIAGNOSIS — R2681 Unsteadiness on feet: Secondary | ICD-10-CM | POA: Diagnosis not present

## 2024-05-23 DIAGNOSIS — R278 Other lack of coordination: Secondary | ICD-10-CM

## 2024-05-23 NOTE — Therapy (Signed)
 OUTPATIENT PHYSICAL THERAPY NEURO TREATMENT   Patient Name: Matthew Black MRN: [id number] DOB:23-Feb-1976, 48 y.o., male Today's Date: 05/23/2024   PCP: Dorina Loving, PA-C REFERRING PROVIDER: Lorilee Sven SQUIBB, MD  END OF SESSION:   PT End of Session - 05/23/24 1406     Visit Number 18    Number of Visits 24    Date for PT Re-Evaluation 06/02/24    Authorization Type 4/17-7/10 for 24 PT visits    PT Start Time 1406    PT Stop Time 1447    PT Time Calculation (min) 41 min    Equipment Utilized During Treatment Gait belt;Other (comment)   L LE AFO   Activity Tolerance Patient tolerated treatment well    Behavior During Therapy [facility] for tasks assessed/performed               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   Stroke (HCC)    RICA, R A1, R MCA occlusion 07/19/19   Past Surgical History:  Procedure Laterality Date   CRANIOPLASTY Right 06/05/2020   Procedure: CRANIOPLASTY;  Surgeon: Lanis Pupa, MD;  Location: [facility] OR;  Service: Neurosurgery;  Laterality: Right;  right   CRANIOTOMY Right 07/19/2019   Procedure: RIGHT HEMI-CRANIECTOMY With implantation of skull flap to abdominal wall;  Surgeon: Lanis Pupa, MD;  Location: [facility] OR;  Service: Neurosurgery;  Laterality: Right;   CYST EXCISION N/A 10/08/2016   Procedure: EXCISION OF POSTERIOR NECK CYST;  Surgeon: Mitzie DELENA Freund, MD;  Location: [facility] ORS;  Service: General;  Laterality: N/A;   CYSTOSCOPY/URETEROSCOPY/HOLMIUM LASER/STENT PLACEMENT Right 03/16/2020   Procedure: CYSTOSCOPY RIGHT RETROGRADE  PYELOGRAM URETEROSCOPY/HOLMIUM LASER/STENT PLACEMENT;  Surgeon: Carolee Sherwood JONETTA DOUGLAS, MD;  Location: [facility];  Service: Urology;  Laterality: Right;   INCISION AND DRAINAGE ABSCESS N/A 09/22/2014   Procedure: INCISION AND DRAINAGE ABSCESS POSTERIOR NECK;  Surgeon: Donnice KATHEE Lunger, MD;  Location: [facility];  Service: General;  Laterality: N/A;   INCISION AND DRAINAGE ABSCESS N/A 12/20/2015   Procedure: INCISION AND DRAINAGE POSTERIOR NECK MASS;  Surgeon: Krystal Spinner, MD;  Location: [facility];  Service: General;  Laterality: N/A;   INCISION AND DRAINAGE ABSCESS Left 07/10/2004   middle finger   IR IVC FILTER PLMT / S&I /IMG GUID/MOD SED  07/21/2019   IR RADIOLOGIST EVAL & MGMT  12/14/2019   IR RADIOLOGIST EVAL & MGMT  12/21/2020   IR VENOGRAM RENAL UNI RIGHT  07/21/2019   MASS EXCISION N/A 07/21/2017   Procedure: EXCISION OF BENIGN NECK LESION WITH LAYERED CLOSURE;  Surgeon: Arelia Filippo, MD;  Location: [facility];  Service: Plastics;  Laterality: N/A;   MASS EXCISION N/A 11/10/2017   Procedure: EXCISION BENIGN LESION OF THE NECK WITH LAYERED CLOSURE;  Surgeon: Arelia Filippo, MD;  Location: [facility];  Service: Plastics;  Laterality: N/A;   Patient Active Problem List   Diagnosis Date Noted   Hx of adenomatous colonic polyps 06/01/2023   COVID-19 virus infection 04/21/2021   Left-sided weakness 04/21/2021  S/P craniotomy 06/05/2020   History of cranioplasty 06/05/2020   Peri-rectal abscess 02/14/2020   Abnormal CT scan, pelvis 02/14/2020   Pancytopenia (HCC) 12/02/2019   Nephrolithiasis 12/02/2019   Hydronephrosis with renal and ureteral calculus obstruction 12/02/2019   Rectal pain 12/02/2019   Hyperkalemia 12/02/2019   Reactive depression    Wound infection after surgery    Sleep disturbance    Dysphagia, post-stroke    Transaminitis    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   Cerebral abscess    Urinary tract infection without hematuria     Altered mental status    Primary hypercoagulable state (HCC)    Acute pulmonary embolism without acute cor pulmonale (HCC)    Deep vein thrombosis (DVT) of non-extremity vein    Hypokalemia    Acute blood loss anemia    Leukocytosis    Endotracheal tube present    Acute respiratory failure with hypoxemia (HCC)    Stroke (cerebrum) (HCC) 07/19/2019   Pressure injury of skin 07/19/2019   Acute CVA (cerebrovascular accident) (HCC)    Encephalopathy    Dysphagia    Acute encephalopathy    Essential hypertension    Obesity 03/12/2016   Scalp abscess 12/20/2015   Neck abscess 12/20/2015   Pilonidal cyst 02/08/2013    ONSET DATE: stroke in August 2020  REFERRING DIAG: I63.9 (ICD-10-CM) - Cerebrovascular accident (CVA), unspecified mechanism (HCC)   THERAPY DIAG:   Muscle weakness (generalized)  Other lack of coordination  Other abnormalities of gait and mobility  Unsteadiness on feet  Hemiplegia and hemiparesis following cerebral infarction affecting left non-dominant side (HCC)  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)  Other symptoms and signs involving the nervous system   Rationale for Evaluation and Treatment: Rehabilitation  SUBJECTIVE:                                                                                                                                                                                             SUBJECTIVE STATEMENT:  Pt states he had a hiccup last week where he was having trouble walking using the litegait during therapy. Pt states he liked the motus nova machine and would like one to use at home. Pt states he has completed the online information and provided them with his insurance information, but hasn't heard back yet. Pt states he didn't realize he had that much movement in his ankle.   Denies stumbles/falls. Denies pain. States he is still waiting for his other shoes to come.    Initial Eval: Patient reports his goal is to walk without  using a cane and without assistance. Patient states he wants to walk like he did before having the stroke. Reports his stroke was in August of 2020. States he currently uses a quad cane for ambulation and tries to walk as much as possible. Reports he has a manual wheelchair, but states he tries not to use it because it depresses him and makes him feel like he is back to when he first had his stroke. Reports wearing L LE custom AFO at all times.   Reports he is currently doing everything mod-I, but states he could call his mom, dad, or brothers if he needed assistance.  Reports he has routine botox  injections in L UE to help manage tone, but doesn't receive any in his LE.  Reports he has a hospital bed, but sleeps in a regular bed.  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  Etiology of carotid occlusion unclear possibly dissection.  He had a prolonged hospital admission with several complications including DVT, pulmonary embolism, hemorrhagic transformation, abdominal wall hematoma, UTI but made quite remarkable recovery   PAIN:  Are you having pain? No, denies pain today, but states sometimes will have L shoulder pain  PRECAUTIONS: Fall  RED FLAGS: None   WEIGHT BEARING RESTRICTIONS: No  FALLS: Has patient fallen in last 6 months? Yes, stating he fell down his stairs ~5 months ago and is now scared to perform stair navigation  LIVING ENVIRONMENT: Lives with: lives alone Lives in:  House/apartment Stairs: Yes: Internal: flight steps; can reach both and External: 4 steps; can reach both, but states he doesn't have to go upstairs Has following equipment at home: Quad cane small base, Hemi walker, Wheelchair (manual), shower chair, Shower bench, and L LE custom AFO Pt has custom K5 wheelchair with rigid, contoured back support and Roho cushion. Pt reports he calls [employer] to come adjust his seat cushion when he feels it is getting uncomfortable.  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  DIAGNOSTIC FINDINGS:  EXAM: CT ANGIOGRAPHY HEAD AND NECK WITH AND WITHOUT CONTRAST IMPRESSION: 1. No acute intracranial abnormality. 2. Chronically occluded right ICA with minimal opacification of the right MCA and ACA branches. 3. Redemonstrated extensive encephalomalacia in the right MCA and ACA territories secondary to a prior infarct.  Electronically Signed   By: Lyndall Gore M.D.   On: 11/27/2023 13:45  COGNITION: Overall cognitive status: Within functional limits for tasks assessed  VISION (03/29/2024): Therapist performed visual field screen with pt appearing to have L homonomous hemianopsia - pt confirms he has loss of vision on L side.   SENSATION: Light touch: Impaired in L LE Proprioception: Impaired  in L LE  Can feel deep pressure in L LE   COORDINATION: Impaired in L  LE due to paresis, hypertonia, and overall decreased flexibility  EDEMA:  Not formally assessed  MUSCLE TONE: LLE: Mild, Moderate, and  Hypertonic  MUSCLE LENGTH: Not formally assessed; however, demonstrates L hamstring muscle tightness  DTRs:  Not formally assessed  POSTURE: rounded shoulders, forward head, and weight shift right  LOWER EXTREMITY ROM:       Right Eval Active ROM Left Eval Passive ROM  Hip flexion WFL Achieves at least 90  Hip extension    Hip abduction    Hip adduction    Hip internal rotation  Unable to achieve neutral hip rotation, lacking internal rotation  Hip external rotation  [facility] and rests in excessive ER  Knee flexion [facility]  Knee extension WFL Able to achieve terminal knee extension in supine, but with significant discomfort due to hamstring tone  Ankle dorsiflexion WFL Did not remove AFO to assess this date  Ankle plantarflexion WFL Did not remove AFO to assess this date  Ankle inversion    Ankle eversion     (Blank rows = not tested)  LOWER EXTREMITY MMT:    MMT Right Eval Left Eval  Hip flexion 4+ 2-  (compensates with hip adductor activation)  Hip extension    Hip abduction    Hip adduction  3+  Hip internal rotation  2-  Hip external rotation  2  Knee flexion 4+ 1  Knee extension 4+ 3+  Ankle dorsiflexion 4+ Did not remove AFO to assess this date  Ankle plantarflexion 4+ Did not remove AFO to assess this date  Ankle inversion    Ankle eversion    (Blank rows = not tested)  Manual Muscle Test Scale 0/5 = No muscle contraction can be seen or felt 1/5 = Contraction can be felt, but there is no motion 2-/5 = Part moves through incomplete ROM w/ gravity decreased 2/5 = Part moves through complete ROM w/ gravity decreased 2+/5 = Part moves through incomplete ROM (<50%) against gravity or through complete ROM w/ gravity 3-/5 = Part moves through incomplete ROM (>50%) against gravity 3/5 = Part moves through complete ROM against gravity 3+/5 = Part moves through complete ROM against gravity/slight resistance 4-/5= Holds test position against slight to moderate  pressure 4/5 = Part moves through complete ROM against gravity/moderate resistance 4+/5= Holds test position against moderate to strong pressure 5/5 = Part moves through complete ROM against gravity/full resistance  BED MOBILITY: (sleeps in regular bed, but has hospital bed available) Findings: Sit to supine CGA Supine to sit Min A and Mod A Rolling to Right Mod A Rolling to Left Min A and Mod A  TRANSFERS: Sit to stand: CGA and Min A  Assistive device utilized: Counselling psychologist     Stand to sit: CGA and Min A  Assistive device utilized: Tree surgeon to chair: Min A  Assistive device utilized: Counselling psychologist       RAMP:  Not tested  CURB:  Not tested  STAIRS: Not tested *need to assess*  GAIT: Findings: advances L LE with excessive hip external rotation using hip adductors, step to pattern, decreased arm swing- Left, decreased step length- Right, decreased stance time- Left, decreased stride length, decreased hip/knee flexion- Left, decreased ankle dorsiflexion- Left, lateral lean- Right, and decreased trunk rotation Distance walked: ~91ft Assistive device utilized: small based quad cane in R UE  Level of assistance: CGA and Min A  FUNCTIONAL TESTS:  5  times sit to stand: need to assess Timed up and go (TUG): need to assess 6 minute walk test: need to assess 10 meter walk test: 0.78m/s using small based quad cane and CGA Berg Balance Scale: need to assess  PATIENT SURVEYS:  Stroke Impact Scale 03/15/2024: 49/80                                                                                                                               TREATMENT DATE: 05/23/2024  Pt arrived via transport chair.   Therapist sent email to follow-up on patient's eligibility to receive Motus Nova foot device for home use.  Gait training ~136ft into therapy clinic using SBQC with SBA - pt demos increased step lengths bilaterally with reciprocal pattern and  improving gait speed along with longer L stance phase; however, continues to have L LE mildly excessively externally rotated.  Pt agreeable to trial litegait harness again despite being unsuccessful with it last week.  Donned litegait harness in standing with supervision using R UE support on litegait handle.  Gait training 1 lap (~148ft) + 2 laps (370ft) (seated break between) in litegait harness WITHOUT BWS and therapist assisting with managing the litegait. Pt demonstrating the following gait deviations with therapist providing the described cuing and facilitation for improvement:  R UE support on litegait handle  Progressed to 1x no UE support for ~73ft of continuous gait! Totaled ~74ft of gait without UE support! Without UE support pt does demo shorter L stance phase with smaller step lengths bilaterally; however, is able to maintain the reciprocal stepping pattern Therapist facilitating forward movement of L hip throughout gait to avoid pt's hips being rotated towards the L with L hip posterior to R Therapist facilitating increased L wt shift and longer L stance phase time Attempted backwards gait at end of 2nd gait trial, but pt too fatigued; therefore discontinued Will benefit from attempting this again in future visit  Doffed litegait harness as described above.   Stand pivot to transport chair using SBQC with SBA for safety.  PATIENT EDUCATION: Education details: Therapy POC, LTGs, HEP, importance of improving L LE flexibility, findings during assessment  Person educated: Patient Education method: Explanation and Handouts Education comprehension: verbalized understanding and needs further education  HOME EXERCISE PROGRAM:  Access Code: [email or web address] URL: [email or web address] Date: 03/10/2024 Prepared by: Connell Kiss  Exercises - Seated Hip Adduction Isometrics with Ball  - 1 x daily - 7 x weekly - 2 sets - 10 reps - 5 seconds hold - Supine Quadricep Sets  - 1 x  daily - 7 x weekly - 2 sets - 10 reps - 3 seconds hold   GOALS: Goals reviewed with patient? Yes  SHORT TERM GOALS: Target date: 04/21/2024  Patient will be independent in home exercise program to improve strength/mobility for better functional independence with ADLs.  Baseline: initiated on 03/10/2024 04/14/2024: continued Goal status: IN PROGRESS   LONG TERM GOALS: Target date:  06/02/2024   Patient (< 67 years old) will complete five times sit to stand (5XSTS) test in < 10 seconds indicating an increased LE strength and improved balance.  Baseline: 03/15/2024: 16.53 seconds from standard green chair relying on R hand to push-up from armrest and pt's personal SBQC nearby, but not using it VERSUS 25.85 seconds with R hand across chest, with min A for balance/safety without use of UE support 04/14/2024: 8.96 seconds using R UE support to push-up from armrest and pt's SBQC next to him, 13.75 seconds with R hand across chest with min A for therapist to stabilize chair behind him Goal status: IN PROGRESS  2.  Patient will increase Berg Balance score to > 45/56 to demonstrate improved balance and decreased fall risk during functional activities and ADLs.  Baseline: 03/15/2024: 14/56 04/14/2024: 22/56 Goal status: IN PROGRESS  3.  Patient will increase 10 meter walk test to >1.61m/s using LRAD as to improve gait speed for better community ambulation and to reduce fall risk.  Baseline: 0.30 m/s using small based quad cane with CGA and 1x light min A due to balance instability  04/14/2024: 0.405 m/s using SBQC with close SBA for safety Goal status: IN PROGRESS  4.  Patient will increase six minute walk test distance to >515ft for progression towards community ambulator and improve gait ability  Baseline: 257ft with [facility]  04/20/2024:  258ft with SBQC and SBA Goal status: IN PROGRESS  5.  Patient will reduce timed up and go to <15 seconds to reduce fall risk and demonstrate improved transfer/gait  ability. Baseline: 03/15/2024: 25.32 seconds using SBQC and chair with armrest as well as CGA for safety/steadying 04/14/2024: 22.25 seconds using SBQC & chair w/ armrest and close SBA for safety Goal status: IN PROGRESS  6.  Patient will ascend/descend 4 stairs, using railing, independently without loss of balance to improve ability to get in/out of home.   Baseline: 03/31/2024: using R UE support on each HR with skilled min assist for balance using primarily step-to pattern 04/20/2024:  using R UE support on each HR with skilled CGA for safety using step-to pattern Goal status: IN PROGRESS  ASSESSMENT:  CLINICAL IMPRESSION:    Patient is a 48 y.o. male who was seen today for physical therapy treatment for L hemiplegia (UE>LE) with increased tone, impaired balance, impaired functional mobility, impaired gait, and increased fall risk following CVA in 2020. Mr. Trevino is highly motivated to participate in therapy and improve his ability to walk. Patient reports using Motus Nova foot device made him aware of how much ankle DF activation he truly has and is eager to receive one for home use. Therapist sent email follow-up to [facility] representative to learn if pt is eligible to receive the device. Therapy session focused on gait training with use of litegait harness to decrease pt's reliance on R UE support to promote L LE NMR and improve gait mechanics to increase pt's safety and independence with gait. Patient progressed to ambulating a continuous 66ft distance without UE support in litegait for safety! Mr. Poyer will benefit from further skilled PT to improve these deficits in order to increase QOL, decrease fall risk, and ease/safety with ADLs and functional mobility.    OBJECTIVE IMPAIRMENTS: Abnormal gait, cardiopulmonary status limiting activity, decreased activity tolerance, decreased balance, decreased coordination, decreased endurance, decreased knowledge of use of DME, decreased mobility,  difficulty walking, decreased ROM, decreased strength, hypomobility, impaired flexibility, impaired sensation, impaired tone, impaired UE functional use, improper  body mechanics, postural dysfunction, and pain.   ACTIVITY LIMITATIONS: carrying, lifting, bending, standing, squatting, sleeping, stairs, transfers, bed mobility, continence, bathing, toileting, dressing, reach over head, hygiene/grooming, locomotion level, and caring for others  PARTICIPATION LIMITATIONS: meal prep, cleaning, laundry, medication management, shopping, community activity, and yard work  PERSONAL FACTORS: Age, Past/current experiences, Time since onset of injury/illness/exacerbation, Transportation, and 3+ comorbidities: HTN, hx of kidney stones, depression are also affecting patient's functional outcome.   REHAB POTENTIAL: Good  CLINICAL DECISION MAKING: Evolving/moderate complexity  EVALUATION COMPLEXITY: Moderate  PLAN:  PT FREQUENCY: 1-2x/week  PT DURATION: 12 weeks  PLANNED INTERVENTIONS: [id number]- PT Re-evaluation, 97750- Physical Performance Testing, 97110-Therapeutic exercises, 97530- Therapeutic activity, [id number]- Neuromuscular re-education, 97535- Self Care, [id number]- Manual therapy, [id number]- Gait training, [phone]- Orthotic/Prosthetic subsequent, [phone]- Canalith repositioning, [phone]- Electrical stimulation (manual), Patient/Family education, Balance training, Stair training, Taping, Dry Needling, Joint mobilization, Joint manipulation, Vestibular training, Visual/preceptual remediation/compensation, DME instructions, Cryotherapy, Moist heat, and Biofeedback  PLAN FOR NEXT SESSION:  - high intensity gait training using +2 HHA vs litegait support vs Lofstrand crutch  - continue adding 7lb AW to L LE when appropriate vs decreasing RUE support/reliance while in litegait  - continue use of litegait harness in upcoming visits to decrease reliance on R UE support - L LE stretching and weightbearing for tone management and  to improve alignment during gait/standing  - bridges with R foot placed on unstable surface to promote increased L LE wt bearing - stair navigation for L LE NMR - progress HEP - follow-up on when pt's next apt is for botox    Connell Kiss, PT, DPT, NCS, CSRS Physical Therapist - [place]  [facility]  3:01 PM 05/23/24

## 2024-05-25 ENCOUNTER — Ambulatory Visit: Attending: Physical Medicine and Rehabilitation | Admitting: Physical Therapy

## 2024-05-25 DIAGNOSIS — R29818 Other symptoms and signs involving the nervous system: Secondary | ICD-10-CM | POA: Diagnosis not present

## 2024-05-25 DIAGNOSIS — R2689 Other abnormalities of gait and mobility: Secondary | ICD-10-CM | POA: Diagnosis not present

## 2024-05-25 DIAGNOSIS — R278 Other lack of coordination: Secondary | ICD-10-CM | POA: Insufficient documentation

## 2024-05-25 DIAGNOSIS — R2681 Unsteadiness on feet: Secondary | ICD-10-CM | POA: Diagnosis not present

## 2024-05-25 DIAGNOSIS — I69354 Hemiplegia and hemiparesis following cerebral infarction affecting left non-dominant side: Secondary | ICD-10-CM | POA: Diagnosis not present

## 2024-05-25 DIAGNOSIS — M6281 Muscle weakness (generalized): Secondary | ICD-10-CM | POA: Diagnosis not present

## 2024-05-25 NOTE — Therapy (Signed)
 OUTPATIENT PHYSICAL THERAPY NEURO TREATMENT   Patient Name: Matthew Black MRN: [id number] DOB:1976-01-06, 48 y.o., male Today's Date: 05/25/2024   PCP: Dorina Loving, PA-C REFERRING PROVIDER: Lorilee Sven SQUIBB, MD  END OF SESSION:   PT End of Session - 05/25/24 1320     Visit Number 19    Number of Visits 24    Date for PT Re-Evaluation 06/02/24    Authorization Type 4/17-7/10 for 24 PT visits    PT Start Time 1320    PT Stop Time 1406    PT Time Calculation (min) 46 min    Equipment Utilized During Treatment Gait belt;Other (comment)   L LE AFO   Activity Tolerance Patient tolerated treatment well    Behavior During Therapy [facility] for tasks assessed/performed           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   Stroke (HCC)    RICA, R A1, R MCA occlusion 07/19/19   Past Surgical History:  Procedure Laterality Date   CRANIOPLASTY Right 06/05/2020   Procedure: CRANIOPLASTY;  Surgeon: Lanis Pupa, MD;  Location: [facility] OR;  Service: Neurosurgery;  Laterality: Right;  right   CRANIOTOMY Right 07/19/2019   Procedure: RIGHT HEMI-CRANIECTOMY With implantation of skull flap to abdominal wall;  Surgeon: Lanis Pupa, MD;  Location: [facility] OR;  Service: Neurosurgery;  Laterality: Right;   CYST EXCISION N/A 10/08/2016   Procedure: EXCISION OF POSTERIOR NECK CYST;  Surgeon: Mitzie DELENA Freund, MD;  Location: [facility] ORS;  Service: General;  Laterality: N/A;   CYSTOSCOPY/URETEROSCOPY/HOLMIUM LASER/STENT PLACEMENT Right 03/16/2020   Procedure: CYSTOSCOPY RIGHT RETROGRADE PYELOGRAM  URETEROSCOPY/HOLMIUM LASER/STENT PLACEMENT;  Surgeon: Carolee Sherwood JONETTA DOUGLAS, MD;  Location: [facility] ORS;  Service: Urology;  Laterality: Right;   INCISION AND DRAINAGE ABSCESS N/A 09/22/2014   Procedure: INCISION AND DRAINAGE ABSCESS POSTERIOR NECK;  Surgeon: Donnice KATHEE Lunger, MD;  Location: [facility];  Service: General;  Laterality: N/A;   INCISION AND DRAINAGE ABSCESS N/A 12/20/2015   Procedure: INCISION AND DRAINAGE POSTERIOR NECK MASS;  Surgeon: Krystal Spinner, MD;  Location: [facility];  Service: General;  Laterality: N/A;   INCISION AND DRAINAGE ABSCESS Left 07/10/2004   middle finger   IR IVC FILTER PLMT / S&I /IMG GUID/MOD SED  07/21/2019   IR RADIOLOGIST EVAL & MGMT  12/14/2019   IR RADIOLOGIST EVAL & MGMT  12/21/2020   IR VENOGRAM RENAL UNI RIGHT  07/21/2019   MASS EXCISION N/A 07/21/2017   Procedure: EXCISION OF BENIGN NECK LESION WITH LAYERED CLOSURE;  Surgeon: Arelia Filippo, MD;  Location: [facility];  Service: Plastics;  Laterality: N/A;   MASS EXCISION N/A 11/10/2017   Procedure: EXCISION BENIGN LESION OF THE NECK WITH LAYERED CLOSURE;  Surgeon: Arelia Filippo, MD;  Location: [facility];  Service: Plastics;  Laterality: N/A;   Patient Active Problem List   Diagnosis Date Noted   Hx of adenomatous colonic polyps 06/01/2023   COVID-19 virus infection 04/21/2021   Left-sided weakness 04/21/2021   S/P craniotomy 06/05/2020  History of cranioplasty 06/05/2020   Peri-rectal abscess 02/14/2020   Abnormal CT scan, pelvis 02/14/2020   Pancytopenia (HCC) 12/02/2019   Nephrolithiasis 12/02/2019   Hydronephrosis with renal and ureteral calculus obstruction 12/02/2019   Rectal pain 12/02/2019   Hyperkalemia 12/02/2019   Reactive depression    Wound infection after surgery    Sleep disturbance    Dysphagia, post-stroke    Transaminitis    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   Cerebral abscess    Urinary tract infection without hematuria    Altered  mental status    Primary hypercoagulable state (HCC)    Acute pulmonary embolism without acute cor pulmonale (HCC)    Deep vein thrombosis (DVT) of non-extremity vein    Hypokalemia    Acute blood loss anemia    Leukocytosis    Endotracheal tube present    Acute respiratory failure with hypoxemia (HCC)    Stroke (cerebrum) (HCC) 07/19/2019   Pressure injury of skin 07/19/2019   Acute CVA (cerebrovascular accident) (HCC)    Encephalopathy    Dysphagia    Acute encephalopathy    Essential hypertension    Obesity 03/12/2016   Scalp abscess 12/20/2015   Neck abscess 12/20/2015   Pilonidal cyst 02/08/2013    ONSET DATE: stroke in August 2020  REFERRING DIAG: I63.9 (ICD-10-CM) - Cerebrovascular accident (CVA), unspecified mechanism (HCC)   THERAPY DIAG:   Muscle weakness (generalized)  Other lack of coordination  Other abnormalities of gait and mobility  Unsteadiness on feet  Hemiplegia and hemiparesis following cerebral infarction affecting left non-dominant side (HCC)  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)  Other symptoms and signs involving the nervous system   Rationale for Evaluation and Treatment: Rehabilitation  SUBJECTIVE:                                                                                                                                                                                             SUBJECTIVE STATEMENT:  Pt states last session his white shoes were hurting his L foot because they didn't give him much support. Pt wearing his black shoes today. Pt becomes tearful and states a previous therapist said his shoes were worn out and it upset him because he has to wear those shoes because they fit with his brace and he can get them on independently.  Therapist provided emotional support and encouragement for patient to wear shoes that fit his needs.   Pt reports yesterday at [employer] he was able  to walk all the way from the handicap parking spot around to  opposite entry door, stating he feels he is improving in his gait endurance.   No stumbles/falls reported. No reports of pain during session.  Initial Eval: Patient reports his goal is to walk without using a cane and without assistance. Patient states he wants to walk like he did before having the stroke. Reports his stroke was in August of 2020. States he currently uses a quad cane for ambulation and tries to walk as much as possible. Reports he has a manual wheelchair, but states he tries not to use it because it depresses him and makes him feel like he is back to when he first had his stroke. Reports wearing L LE custom AFO at all times.   Reports he is currently doing everything mod-I, but states he could call his mom, dad, or brothers if he needed assistance.  Reports he has routine botox  injections in L UE to help manage tone, but doesn't receive any in his LE.  Reports he has a hospital bed, but sleeps in a regular bed.  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  Etiology of carotid occlusion unclear possibly dissection.  He had a prolonged hospital admission with several complications including DVT, pulmonary embolism, hemorrhagic transformation, abdominal wall hematoma, UTI but made quite remarkable recovery   PAIN:  Are you having pain? No, denies pain today, but states sometimes will have L shoulder pain  PRECAUTIONS: Fall  RED FLAGS: None   WEIGHT BEARING RESTRICTIONS: No  FALLS: Has patient  fallen in last 6 months? Yes, stating he fell down his stairs ~5 months ago and is now scared to perform stair navigation  LIVING ENVIRONMENT: Lives with: lives alone Lives in: House/apartment Stairs: Yes: Internal: flight steps; can reach both and External: 4 steps; can reach both, but states he doesn't have to go upstairs Has following equipment at home: Quad cane small base, Hemi walker, Wheelchair (manual), shower chair, Shower bench, and L LE custom AFO Pt has custom K5 wheelchair with rigid, contoured back support and Roho cushion. Pt reports he calls [employer] to come adjust his seat cushion when he feels it is getting uncomfortable.  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  DIAGNOSTIC FINDINGS:  EXAM: CT ANGIOGRAPHY HEAD AND NECK WITH AND WITHOUT CONTRAST IMPRESSION: 1. No acute intracranial abnormality. 2. Chronically occluded right ICA with minimal opacification of the right MCA and ACA branches. 3. Redemonstrated extensive encephalomalacia in the right MCA and ACA territories secondary to a prior infarct.  Electronically Signed   By: Lyndall Gore M.D.   On: 11/27/2023 13:45  COGNITION: Overall cognitive status: Within functional limits for tasks assessed  VISION (03/29/2024): Therapist performed visual field screen with pt appearing to have L homonomous hemianopsia - pt confirms he has  loss of vision on L side.   SENSATION: Light touch: Impaired in L LE Proprioception: Impaired  in L LE  Can feel deep pressure in L LE    COORDINATION: Impaired in L LE due to paresis, hypertonia, and overall decreased flexibility  EDEMA:  Not formally assessed  MUSCLE TONE: LLE: Mild, Moderate, and Hypertonic  MUSCLE LENGTH: Not formally assessed; however, demonstrates L hamstring muscle tightness  DTRs:  Not formally assessed  POSTURE: rounded shoulders, forward head, and weight shift right  LOWER EXTREMITY ROM:       Right Eval Active ROM Left Eval Passive ROM  Hip flexion WFL Achieves at least 90  Hip extension    Hip abduction    Hip adduction    Hip internal rotation  Unable to achieve neutral hip rotation, lacking internal rotation  Hip external rotation  [facility] and rests in excessive ER  Knee flexion [facility]  Knee extension WFL Able to achieve terminal knee extension in supine, but with significant discomfort due to hamstring tone  Ankle dorsiflexion WFL Did not remove AFO to assess this date  Ankle plantarflexion WFL Did not remove AFO to assess this date  Ankle inversion    Ankle eversion     (Blank rows = not tested)  LOWER EXTREMITY MMT:    MMT Right Eval Left Eval  Hip flexion 4+ 2-  (compensates with hip adductor activation)  Hip extension    Hip abduction    Hip adduction  3+  Hip internal rotation  2-  Hip external rotation  2  Knee flexion 4+ 1  Knee extension 4+ 3+  Ankle dorsiflexion 4+ Did not remove AFO to assess this date  Ankle plantarflexion 4+ Did not remove AFO to assess this date  Ankle inversion    Ankle eversion    (Blank rows = not tested)  Manual Muscle Test Scale 0/5 = No muscle contraction can be seen or felt 1/5 = Contraction can be felt, but there is no motion 2-/5 = Part moves through incomplete ROM w/ gravity decreased 2/5 = Part moves through complete ROM w/ gravity decreased 2+/5 = Part moves through incomplete ROM (<50%) against gravity or through complete ROM w/ gravity 3-/5 = Part moves through incomplete ROM (>50%) against gravity 3/5 = Part  moves through complete ROM against gravity 3+/5 = Part moves through complete ROM against gravity/slight resistance 4-/5= Holds test position against slight to moderate pressure 4/5 = Part moves through complete ROM against gravity/moderate resistance 4+/5= Holds test position against moderate to strong pressure 5/5 = Part moves through complete ROM against gravity/full resistance  BED MOBILITY: (sleeps in regular bed, but has hospital bed available) Findings: Sit to supine CGA Supine to sit Min A and Mod A Rolling to Right Mod A Rolling to Left Min A and Mod A  TRANSFERS: Sit to stand: CGA and Min A  Assistive device utilized: Counselling psychologist     Stand to sit: CGA and Min A  Assistive device utilized: Tree surgeon to chair: Min A  Assistive device utilized: Counselling psychologist       RAMP:  Not tested  CURB:  Not tested  STAIRS: Not tested *need to assess*  GAIT: Findings: advances L LE with excessive hip external rotation using hip adductors, step to pattern, decreased arm swing- Left, decreased step length- Right, decreased stance time- Left, decreased stride length, decreased hip/knee flexion- Left, decreased ankle  dorsiflexion- Left, lateral lean- Right, and decreased trunk rotation Distance walked: ~75ft Assistive device utilized: small based quad cane in R UE  Level of assistance: CGA and Min A  FUNCTIONAL TESTS:  5 times sit to stand: need to assess Timed up and go (TUG): need to assess 6 minute walk test: need to assess 10 meter walk test: 0.51m/s using small based quad cane and CGA Berg Balance Scale: need to assess  PATIENT SURVEYS:  Stroke Impact Scale 03/15/2024: 49/80                                                                                                                               TREATMENT DATE: 05/25/2024  Pt arrived via transport chair.   Gait training ~134ft into therapy clinic using SBQC with SBA - pt demos increased  step lengths bilaterally with reciprocal pattern and improving gait speed along with longer L stance phase; however, continues to have L LE mild to moderately excessively externally rotated.  Pt agreeable to continue gait training with use of litegait harness.   Donned litegait harness in standing with supervision using R UE support on litegait handle.  Gait training 1 lap (~17ft) x2 reps (seated break between) in litegait harness WITHOUT BWS and therapist assisting with managing the litegait. Pt demonstrating the following gait deviations with therapist providing the described cuing and facilitation for improvement:  R UE support on litegait handle  Progressed to no UE support for ~65ft of continuous gait during 1st lap and then for ~36ft during 2nd lap! Without UE support pt does demo shorter L stance phase with smaller step lengths bilaterally (especially on R); however, is able to maintain slight reciprocal stepping pattern Improving R LE step length with increased repetition and pt increasing confidence ambulating without UE support Donned 6lb AW to L LE during 2nd half of 2nd lap - this is when pt was able to ambulate ~60ft without UE support  Therapist facilitating forward movement of L hip throughout gait to avoid pt's hips being rotated towards the L with L hip posterior to R This is improved today with less facilitation required Therapist facilitating increased L wt shift and longer L stance phase time Attempted backwards gait at end of 2nd gait trial, making it ~82ft, but pt too fatigued; therefore discontinued Will benefit from attempting this again in future visit  Doffed litegait harness as described above.   Updated patient on his request to receive the Motus Nova foot device for home use.  Gait training ~319ft using SBQC with SBA to elevator and on/off elevator targeting community level ambulation and increasing gait distance. Patient does require some support on elevator  railing for balance when the elevator was moving.  Pt left seated in transport chair with transport staff for safety.   PATIENT EDUCATION: Education details: Therapy POC, LTGs, HEP, importance of improving L LE flexibility, findings during assessment  Person educated: Patient Education method: Explanation and Handouts  Education comprehension: verbalized understanding and needs further education  HOME EXERCISE PROGRAM:  Access Code: [email or web address] URL: [email or web address] Date: 03/10/2024 Prepared by: Connell Kiss  Exercises - Seated Hip Adduction Isometrics with Ball  - 1 x daily - 7 x weekly - 2 sets - 10 reps - 5 seconds hold - Supine Quadricep Sets  - 1 x daily - 7 x weekly - 2 sets - 10 reps - 3 seconds hold   GOALS: Goals reviewed with patient? Yes  SHORT TERM GOALS: Target date: 04/21/2024  Patient will be independent in home exercise program to improve strength/mobility for better functional independence with ADLs.  Baseline: initiated on 03/10/2024 04/14/2024: continued Goal status: IN PROGRESS   LONG TERM GOALS: Target date: 06/02/2024   Patient (< 62 years old) will complete five times sit to stand (5XSTS) test in < 10 seconds indicating an increased LE strength and improved balance.  Baseline: 03/15/2024: 16.53 seconds from standard green chair relying on R hand to push-up from armrest and pt's personal SBQC nearby, but not using it VERSUS 25.85 seconds with R hand across chest, with min A for balance/safety without use of UE support 04/14/2024: 8.96 seconds using R UE support to push-up from armrest and pt's SBQC next to him, 13.75 seconds with R hand across chest with min A for therapist to stabilize chair behind him Goal status: IN PROGRESS  2.  Patient will increase Berg Balance score to > 45/56 to demonstrate improved balance and decreased fall risk during functional activities and ADLs.  Baseline: 03/15/2024: 14/56 04/14/2024: 22/56 Goal status: IN  PROGRESS  3.  Patient will increase 10 meter walk test to >1.68m/s using LRAD as to improve gait speed for better community ambulation and to reduce fall risk.  Baseline: 0.30 m/s using small based quad cane with CGA and 1x light min A due to balance instability  04/14/2024: 0.405 m/s using SBQC with close SBA for safety Goal status: IN PROGRESS  4.  Patient will increase six minute walk test distance to >526ft for progression towards community ambulator and improve gait ability  Baseline: 268ft with [facility]  04/20/2024:  21ft with SBQC and SBA Goal status: IN PROGRESS  5.  Patient will reduce timed up and go to <15 seconds to reduce fall risk and demonstrate improved transfer/gait ability. Baseline: 03/15/2024: 25.32 seconds using SBQC and chair with armrest as well as CGA for safety/steadying 04/14/2024: 22.25 seconds using SBQC & chair w/ armrest and close SBA for safety Goal status: IN PROGRESS  6.  Patient will ascend/descend 4 stairs, using railing, independently without loss of balance to improve ability to get in/out of home.   Baseline: 03/31/2024: using R UE support on each HR with skilled min assist for balance using primarily step-to pattern 04/20/2024:  using R UE support on each HR with skilled CGA for safety using step-to pattern Goal status: IN PROGRESS  ASSESSMENT:  CLINICAL IMPRESSION:    Patient is a 48 y.o. male who was seen today for physical therapy treatment for L hemiplegia (UE>LE) with increased tone, impaired balance, impaired functional mobility, impaired gait, and increased fall risk following CVA in 2020. Mr. Minton is highly motivated to participate in therapy and improve his ability to walk. Patient continues to be eager to receive a Motus Nova foot device for home use and is currently still awaiting for MD order and insurance authorization. Therapy session focused on gait training with use of litegait harness to decrease pt's reliance on R UE support  to promote L LE NMR  and improve gait mechanics to increase pt's safety and independence with gait. Patient progressed to ambulating a continuous 76ft distance without UE support in litegait for safety! Patient also able to progress to ambulating with 6lb AW on L LE and without UE support for ~20ft! Patient reports he can feel his gait endurance and balance improving. Mr. Proffit will benefit from further skilled PT to improve these deficits in order to increase QOL, decrease fall risk, and ease/safety with ADLs and functional mobility.    OBJECTIVE IMPAIRMENTS: Abnormal gait, cardiopulmonary status limiting activity, decreased activity tolerance, decreased balance, decreased coordination, decreased endurance, decreased knowledge of use of DME, decreased mobility, difficulty walking, decreased ROM, decreased strength, hypomobility, impaired flexibility, impaired sensation, impaired tone, impaired UE functional use, improper body mechanics, postural dysfunction, and pain.   ACTIVITY LIMITATIONS: carrying, lifting, bending, standing, squatting, sleeping, stairs, transfers, bed mobility, continence, bathing, toileting, dressing, reach over head, hygiene/grooming, locomotion level, and caring for others  PARTICIPATION LIMITATIONS: meal prep, cleaning, laundry, medication management, shopping, community activity, and yard work  PERSONAL FACTORS: Age, Past/current experiences, Time since onset of injury/illness/exacerbation, Transportation, and 3+ comorbidities: HTN, hx of kidney stones, depression are also affecting patient's functional outcome.   REHAB POTENTIAL: Good  CLINICAL DECISION MAKING: Evolving/moderate complexity  EVALUATION COMPLEXITY: Moderate  PLAN:  PT FREQUENCY: 1-2x/week  PT DURATION: 12 weeks  PLANNED INTERVENTIONS: 97164- PT Re-evaluation, 97750- Physical Performance Testing, 97110-Therapeutic exercises, 97530- Therapeutic activity, [id number]- Neuromuscular re-education, 97535- Self Care, [id number]- Manual  therapy, [id number]- Gait training, [phone]- Orthotic/Prosthetic subsequent, [phone]- Canalith repositioning, [phone]- Electrical stimulation (manual), Patient/Family education, Balance training, Stair training, Taping, Dry Needling, Joint mobilization, Joint manipulation, Vestibular training, Visual/preceptual remediation/compensation, DME instructions, Cryotherapy, Moist heat, and Biofeedback  PLAN FOR NEXT SESSION:  *Progress note* - high intensity gait training using +2 HHA vs litegait support vs Lofstrand crutch  - continue adding 7lb AW to L LE when appropriate vs decreasing RUE support/reliance while in litegait  - continue use of litegait harness in upcoming visits to decrease reliance on R UE support - L LE stretching and weightbearing for tone management and to improve alignment during gait/standing  - bridges with R foot placed on unstable surface to promote increased L LE wt bearing - stair navigation for L LE NMR - progress HEP - follow-up on when pt's next apt is for botox    Arianni Gallego, PT, DPT, NCS, CSRS Physical Therapist - [facility]  [facility]  2:13 PM 05/25/24

## 2024-05-30 ENCOUNTER — Ambulatory Visit

## 2024-05-30 DIAGNOSIS — R2681 Unsteadiness on feet: Secondary | ICD-10-CM

## 2024-05-30 DIAGNOSIS — R29818 Other symptoms and signs involving the nervous system: Secondary | ICD-10-CM

## 2024-05-30 DIAGNOSIS — R2689 Other abnormalities of gait and mobility: Secondary | ICD-10-CM

## 2024-05-30 DIAGNOSIS — I69354 Hemiplegia and hemiparesis following cerebral infarction affecting left non-dominant side: Secondary | ICD-10-CM

## 2024-05-30 DIAGNOSIS — M6281 Muscle weakness (generalized): Secondary | ICD-10-CM | POA: Diagnosis not present

## 2024-05-30 DIAGNOSIS — R278 Other lack of coordination: Secondary | ICD-10-CM | POA: Diagnosis not present

## 2024-05-30 NOTE — Therapy (Signed)
 OUTPATIENT PHYSICAL THERAPY NEURO TREATMENT/PHYSICAL THERAPY PROGRESS NOTE   Dates of reporting period  04/14/24   to   05/30/24    Patient Name: Matthew Black MRN: [id number] DOB:[birth year], 48 y.o., male Today's Date: 05/30/2024   PCP: Dorina Loving, PA-C REFERRING PROVIDER: Lorilee Sven SQUIBB, MD  END OF SESSION:   PT End of Session - 05/30/24 1444     Visit Number 20    Number of Visits 24    Date for PT Re-Evaluation 06/02/24    Authorization Type 4/17-7/10 for 24 PT visits    PT Start Time 1445    PT Stop Time 1525    PT Time Calculation (min) 40 min    Equipment Utilized During Treatment Gait belt;Other (comment)   L LE AFO   Activity Tolerance Patient tolerated treatment well    Behavior During Therapy [facility] for tasks assessed/performed            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   Stroke (HCC)    RICA, R A1, R MCA occlusion 07/19/19   Past Surgical History:  Procedure Laterality Date   CRANIOPLASTY Right 06/05/2020   Procedure: CRANIOPLASTY;  Surgeon: Lanis Pupa, MD;  Location: [facility] OR;  Service: Neurosurgery;  Laterality: Right;  right   CRANIOTOMY Right 07/19/2019   Procedure: RIGHT HEMI-CRANIECTOMY With implantation of skull flap to abdominal wall;  Surgeon: Lanis Pupa, MD;  Location: [facility] OR;  Service: Neurosurgery;  Laterality: Right;   CYST EXCISION N/A 10/08/2016   Procedure: EXCISION OF POSTERIOR NECK CYST;  Surgeon: Mitzie DELENA Freund, MD;  Location: [facility] ORS;  Service: General;  Laterality: N/A;   CYSTOSCOPY/URETEROSCOPY/HOLMIUM  LASER/STENT PLACEMENT Right 03/16/2020   Procedure: CYSTOSCOPY RIGHT RETROGRADE PYELOGRAM URETEROSCOPY/HOLMIUM LASER/STENT PLACEMENT;  Surgeon: Carolee Sherwood JONETTA DOUGLAS, MD;  Location: [facility]S;  Service: Urology;  Laterality: Right;   INCISION AND DRAINAGE ABSCESS N/A 09/22/2014   Procedure: INCISION AND DRAINAGE ABSCESS POSTERIOR NECK;  Surgeon: Donnice KATHEE Lunger, MD;  Location: [facility];  Service: General;  Laterality: N/A;   INCISION AND DRAINAGE ABSCESS N/A 12/20/2015   Procedure: INCISION AND DRAINAGE POSTERIOR NECK MASS;  Surgeon: Krystal Spinner, MD;  Location: [facility];  Service: General;  Laterality: N/A;   INCISION AND DRAINAGE ABSCESS Left 07/10/2004   middle finger   IR IVC FILTER PLMT / S&I /IMG GUID/MOD SED  07/21/2019   IR RADIOLOGIST EVAL & MGMT  12/14/2019   IR RADIOLOGIST EVAL & MGMT  12/21/2020   IR VENOGRAM RENAL UNI RIGHT  07/21/2019   MASS EXCISION N/A 07/21/2017   Procedure: EXCISION OF BENIGN NECK LESION WITH LAYERED CLOSURE;  Surgeon: Arelia Filippo, MD;  Location: [facility];  Service: Plastics;  Laterality: N/A;   MASS EXCISION N/A 11/10/2017   Procedure: EXCISION BENIGN LESION OF THE NECK WITH LAYERED CLOSURE;  Surgeon: Arelia Filippo, MD;  Location: [facility];  Service: Plastics;  Laterality: N/A;   Patient Active Problem List   Diagnosis Date Noted   Hx of adenomatous colonic  polyps 06/01/2023   COVID-19 virus infection 04/21/2021   Left-sided weakness 04/21/2021   S/P craniotomy 06/05/2020   History of cranioplasty 06/05/2020   Peri-rectal abscess 02/14/2020   Abnormal CT scan, pelvis 02/14/2020   Pancytopenia (HCC) 12/02/2019   Nephrolithiasis 12/02/2019   Hydronephrosis with renal and ureteral calculus obstruction 12/02/2019   Rectal pain 12/02/2019   Hyperkalemia 12/02/2019   Reactive depression    Wound infection after surgery    Sleep disturbance    Dysphagia, post-stroke    Transaminitis    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   Cerebral abscess    Urinary tract infection without hematuria    Altered mental status    Primary hypercoagulable state (HCC)    Acute pulmonary embolism without acute cor pulmonale (HCC)    Deep vein thrombosis (DVT) of non-extremity vein    Hypokalemia    Acute blood loss anemia    Leukocytosis    Endotracheal tube present    Acute respiratory failure with hypoxemia (HCC)    Stroke (cerebrum) (HCC) 07/19/2019   Pressure injury of skin 07/19/2019   Acute CVA (cerebrovascular accident) (HCC)    Encephalopathy    Dysphagia    Acute encephalopathy    Essential hypertension    Obesity 03/12/2016   Scalp abscess 12/20/2015   Neck abscess 12/20/2015   Pilonidal cyst 02/08/2013    ONSET DATE: stroke in August 2020  REFERRING DIAG: I63.9 (ICD-10-CM) - Cerebrovascular accident (CVA), unspecified mechanism (HCC)   THERAPY DIAG:   Muscle weakness (generalized)  Other lack of coordination  Other abnormalities of gait and mobility  Unsteadiness on feet  Hemiplegia and hemiparesis following cerebral infarction affecting left non-dominant side (HCC)  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)  Other symptoms and signs involving the nervous system   Rationale for Evaluation and Treatment: Rehabilitation  SUBJECTIVE:                                                                                                                                                                                             SUBJECTIVE STATEMENT:  Pt reports he is doing well and is ready to begin his goal assessment as part of the progress report.    No stumbles/falls reported. No reports of pain during session.    Initial Eval: Patient reports his goal is to walk without using a cane and without assistance. Patient states he wants to walk like he did before having the stroke. Reports his stroke was in August of 2020. States he  currently uses a quad cane for ambulation and  tries to walk as much as possible. Reports he has a manual wheelchair, but states he tries not to use it because it depresses him and makes him feel like he is back to when he first had his stroke. Reports wearing L LE custom AFO at all times.   Reports he is currently doing everything mod-I, but states he could call his mom, dad, or brothers if he needed assistance.  Reports he has routine botox  injections in L UE to help manage tone, but doesn't receive any in his LE.  Reports he has a hospital bed, but sleeps in a regular bed.  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  Etiology of carotid occlusion unclear possibly dissection.  He had a prolonged hospital admission with several complications including DVT, pulmonary embolism, hemorrhagic transformation, abdominal wall hematoma, UTI but made quite remarkable recovery   PAIN:  Are you having pain? No, denies pain today, but states sometimes will have L shoulder pain  PRECAUTIONS: Fall  RED FLAGS: None   WEIGHT BEARING RESTRICTIONS: No  FALLS: Has patient fallen in last 6 months? Yes, stating he fell down his stairs ~5 months ago and is now scared to perform stair navigation  LIVING ENVIRONMENT: Lives with: lives alone Lives in: House/apartment Stairs: Yes: Internal: flight steps; can reach both and External: 4 steps; can reach both, but states he doesn't have to go upstairs Has following equipment at home: Quad cane small base, Hemi walker, Wheelchair (manual),  shower chair, Shower bench, and L LE custom AFO Pt has custom K5 wheelchair with rigid, contoured back support and Roho cushion. Pt reports he calls [employer] to come adjust his seat cushion when he feels it is getting uncomfortable.  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  DIAGNOSTIC FINDINGS:  EXAM: CT ANGIOGRAPHY HEAD AND NECK WITH AND WITHOUT CONTRAST IMPRESSION: 1. No acute intracranial abnormality. 2. Chronically occluded right ICA with minimal opacification of the right MCA and ACA branches. 3. Redemonstrated extensive encephalomalacia in the right MCA and ACA territories secondary to a prior infarct.  Electronically Signed   By: Lyndall Gore M.D.   On: 11/27/2023 13:45  COGNITION: Overall cognitive status: Within functional limits for tasks assessed  VISION (03/29/2024): Therapist performed visual field screen with pt appearing to have L homonomous hemianopsia - pt confirms he has loss of vision on L side.   SENSATION: Light touch: Impaired in L LE Proprioception: Impaired  in L LE  Can feel deep pressure in L LE   COORDINATION: Impaired in L LE due to paresis, hypertonia, and overall decreased flexibility  EDEMA:  Not formally assessed  MUSCLE TONE: LLE: Mild, Moderate, and Hypertonic  MUSCLE LENGTH: Not formally assessed; however, demonstrates L hamstring muscle tightness  DTRs:  Not formally assessed  POSTURE: rounded shoulders, forward head, and weight shift right  LOWER EXTREMITY ROM:        Right Eval Active ROM Left Eval Passive ROM  Hip flexion WFL Achieves at least 90  Hip extension    Hip abduction    Hip adduction    Hip internal rotation  Unable to achieve neutral hip rotation, lacking internal rotation  Hip external rotation  [facility] and rests in excessive ER  Knee flexion [facility]  Knee extension WFL Able to achieve terminal knee extension in supine, but with significant discomfort due to hamstring tone  Ankle dorsiflexion WFL Did not remove AFO to assess this date  Ankle plantarflexion WFL Did not remove AFO to assess this date  Ankle inversion    Ankle eversion     (Blank rows = not tested)  LOWER EXTREMITY MMT:    MMT Right Eval Left Eval  Hip flexion 4+ 2-  (compensates with hip adductor activation)  Hip extension    Hip abduction    Hip adduction  3+  Hip internal rotation  2-  Hip external rotation  2  Knee flexion 4+ 1  Knee extension 4+ 3+  Ankle dorsiflexion 4+ Did not remove AFO to assess this date  Ankle plantarflexion 4+ Did not remove AFO to assess this date  Ankle inversion    Ankle eversion    (Blank rows = not tested)  Manual Muscle Test Scale 0/5 = No muscle contraction can be seen or felt 1/5 = Contraction can be felt, but there is no motion 2-/5 = Part moves through incomplete ROM w/ gravity decreased 2/5 = Part moves through complete ROM w/ gravity decreased 2+/5 = Part moves through incomplete ROM (<50%) against gravity or through complete ROM w/ gravity 3-/5 = Part moves through incomplete ROM (>50%) against gravity 3/5 = Part moves through complete ROM against gravity 3+/5 = Part moves through complete ROM against gravity/slight resistance 4-/5= Holds test position against slight to moderate pressure 4/5 = Part moves through complete ROM against gravity/moderate resistance 4+/5= Holds test position against moderate to strong pressure 5/5 = Part moves through complete ROM against gravity/full resistance  BED MOBILITY: (sleeps  in regular bed, but has hospital bed available) Findings: Sit to supine CGA Supine to sit Min A and Mod A Rolling to Right Mod A Rolling to Left Min A and Mod A  TRANSFERS: Sit to stand: CGA and Min A  Assistive device utilized: Counselling psychologist     Stand to sit: CGA and Min A  Assistive device utilized: Tree surgeon to chair: Min A  Assistive device utilized: Counselling psychologist       RAMP:  Not tested  CURB:  Not tested  STAIRS: Not tested *need to assess*  GAIT: Findings: advances L LE with excessive hip external rotation using hip adductors, step to pattern, decreased arm swing- Left, decreased step length- Right, decreased stance time- Left, decreased stride length, decreased hip/knee flexion- Left, decreased ankle dorsiflexion- Left, lateral lean- Right, and decreased trunk rotation Distance walked: ~3ft Assistive device utilized: small based quad cane in R UE  Level of assistance: CGA and Min A  FUNCTIONAL TESTS:  5 times sit to stand: need to assess Timed up and go (TUG): need to assess 6 minute walk test: need to assess 10 meter walk test: 0.1m/s using small based quad cane and CGA Berg Balance Scale: need  to assess  PATIENT SURVEYS:  Stroke Impact Scale 03/15/2024: 49/80                                                                                                                               TREATMENT DATE: 05/30/2024   Physical Performance Testing:  6 Min Walk Test:  Instructed patient to ambulate as quickly and as safely as possible for 6 minutes using LRAD. Patient was allowed to take standing rest breaks without stopping the test, but if the patient required a sitting rest break the clock would be stopped and the test would be over.  Results: 360 feet using a SBQC with SBA. Results indicate that the patient has reduced endurance with ambulation compared to age matched norms.  Age Matched Norms (in meters): 31-69 yo M: 45 F: 25,  1-79 yo M: 37 F: 471, 42-89 yo M: 417 F: 392 MDC: 58.21 meters (190.98 feet) or 50 meters (ANPTA Core Set of Outcome Measures for Adults with Neurologic Conditions, 2018)   Five times Sit to Stand Test (FTSS) TIME: 13.01 sec  Cut off scores indicative of increased fall risk: >12 sec CVA, >16 sec PD, >13 sec vestibular (ANPTA Core Set of Outcome Measures for Adults with Neurologic Conditions, 2018)   10 Meter Walk Test: Patient instructed to walk 10 meters (32.8 ft) as quickly and as safely as possible at their normal speed Results: 0.379 m/s (26.37 seconds)  Cut off scores:   Household Ambulator  < 0.4 m/s  Limited Community Ambulator  0.4 - 0.8 m/s  [employer]  > 0.8 m/s  Increased fall risk  < 1.10m/s  Crossing a Street  >1.60m/s  MCID 0.05 m/s (small), 0.13 m/s (moderate), 0.06 m/s (significant)  (ANPTA Core Set of Outcome Measures for Adults with Neurologic Conditions, 2018)           PATIENT EDUCATION: Education details: Therapy POC, LTGs, HEP, importance of improving L LE flexibility, findings during assessment  Person educated: Patient Education method: Explanation and Handouts Education comprehension: verbalized understanding and needs further education  HOME EXERCISE PROGRAM:  Access Code: [email or web address] URL: [email or web address] Date: 03/10/2024 Prepared by: Connell Kiss  Exercises - Seated Hip Adduction Isometrics with Ball  - 1 x daily - 7 x weekly - 2 sets - 10 reps - 5 seconds hold - Supine Quadricep Sets  - 1 x daily - 7 x weekly - 2 sets - 10 reps - 3 seconds hold   GOALS: Goals reviewed with patient? Yes  SHORT TERM GOALS: Target date: 04/21/2024  Patient will be independent in home exercise program to improve strength/mobility for better functional independence with ADLs.  Baseline: initiated on 03/10/2024 04/14/2024: continued Goal status: IN PROGRESS   LONG TERM GOALS: Target date: 06/02/2024  Patient (< 76 years  old) will complete five times sit to stand (5XSTS) test in < 10 seconds indicating an increased LE strength and improved balance.  Baseline: 03/15/2024:  16.53 seconds from standard green chair relying on R hand to push-up from armrest and pt's personal SBQC nearby, but not using it VERSUS 25.85 seconds with R hand across chest, with min A for balance/safety without use of UE support 04/14/2024: 8.96 seconds using R UE support to push-up from armrest and pt's SBQC next to him, 13.75 seconds with R hand across chest with min A for therapist to stabilize chair behind him 05/30/24: 13.01 sec with R UE support on chair; 15.26 sec with R hand across chest and minA from therapist to stabilize chair behind pt Goal status: IN PROGRESS  2.  Patient will increase Berg Balance score to > 45/56 to demonstrate improved balance and decreased fall risk during functional activities and ADLs.  Baseline: 03/15/2024: 14/56 04/14/2024: 22/56 05/30/24: TBD at next visit Goal status: IN PROGRESS  3.  Patient will increase 10 meter walk test to >1.17m/s using LRAD as to improve gait speed for better community ambulation and to reduce fall risk.  Baseline: 0.30 m/s using small based quad cane with CGA and 1x light min A due to balance instability  04/14/2024: 0.405 m/s using SBQC with close SBA for safety 05/30/24: 0.379 m/s using SBQC with close SBA for safety Goal status: IN PROGRESS  4.  Patient will increase six minute walk test distance to >54ft for progression towards community ambulator and improve gait ability  Baseline: 256ft with [facility]  04/20/2024:  246ft with SBQC and SBA 05/30/24: 360 ft with SBQC Goal status: IN PROGRESS  5.  Patient will reduce timed up and go to <15 seconds to reduce fall risk and demonstrate improved transfer/gait ability. Baseline: 03/15/2024: 25.32 seconds using SBQC and chair with armrest as well as CGA for safety/steadying 04/14/2024: 22.25 seconds using SBQC & chair w/ armrest and close SBA for  safety 05/30/24: 26.87 seconds w/ SBQC and use  Goal status: IN PROGRESS  6.  Patient will ascend/descend 4 stairs, using railing, independently without loss of balance to improve ability to get in/out of home.   Baseline: 03/31/2024: using R UE support on each HR with skilled min assist for balance using primarily step-to pattern 04/20/2024:  using R UE support on each HR with skilled CGA for safety using step-to pattern 05/30/24: continued use of R UE support on HR with skilled CGA for safety using step-to pattern Goal status: IN PROGRESS  ASSESSMENT:  CLINICAL IMPRESSION:     Pt was able to make significant progress towards 6 minute walk test, as noted.  The progress made, however, made him much more fatigued for the rest of the tests, which he self-reported and elevated some of the other scores as noted as well.  Pt overall is making good progress towards goals.  Patient's condition has the potential to improve in response to therapy. Maximum improvement is yet to be obtained. The anticipated improvement is attainable and reasonable in a generally predictable time.   Pt will continue to benefit from skilled therapy to address remaining deficits in order to improve overall QoL and return to PLOF.     OBJECTIVE IMPAIRMENTS: Abnormal gait, cardiopulmonary status limiting activity, decreased activity tolerance, decreased balance, decreased coordination, decreased endurance, decreased knowledge of use of DME, decreased mobility, difficulty walking, decreased ROM, decreased strength, hypomobility, impaired flexibility, impaired sensation, impaired tone, impaired UE functional use, improper body mechanics, postural dysfunction, and pain.   ACTIVITY LIMITATIONS: carrying, lifting, bending, standing, squatting, sleeping, stairs, transfers, bed mobility, continence, bathing, toileting, dressing, reach over head, hygiene/grooming,  locomotion level, and caring for others  PARTICIPATION LIMITATIONS: meal prep,  cleaning, laundry, medication management, shopping, community activity, and yard work  PERSONAL FACTORS: Age, Past/current experiences, Time since onset of injury/illness/exacerbation, Transportation, and 3+ comorbidities: HTN, hx of kidney stones, depression are also affecting patient's functional outcome.   REHAB POTENTIAL: Good  CLINICAL DECISION MAKING: Evolving/moderate complexity  EVALUATION COMPLEXITY: Moderate  PLAN:  PT FREQUENCY: 1-2x/week  PT DURATION: 12 weeks  PLANNED INTERVENTIONS: [id number]- PT Re-evaluation, 97750- Physical Performance Testing, 97110-Therapeutic exercises, 97530- Therapeutic activity, [id number]- Neuromuscular re-education, 97535- Self Care, [id number]- Manual therapy, [id number]- Gait training, [phone]- Orthotic/Prosthetic subsequent, [phone]- Canalith repositioning, [phone]- Electrical stimulation (manual), Patient/Family education, Balance training, Stair training, Taping, Dry Needling, Joint mobilization, Joint manipulation, Vestibular training, Visual/preceptual remediation/compensation, DME instructions, Cryotherapy, Moist heat, and Biofeedback  PLAN FOR NEXT SESSION:   *BERG balance Test!*   - high intensity gait training using +2 HHA vs litegait support vs Lofstrand crutch  - continue adding 7lb AW to L LE when appropriate vs decreasing RUE support/reliance while in litegait  - continue use of litegait harness in upcoming visits to decrease reliance on R UE support - L LE stretching and weightbearing for tone management and to improve alignment during gait/standing  - bridges with R foot placed on unstable surface to promote increased L LE wt bearing - stair navigation for L LE NMR - progress HEPq - BOTOX  appointment 06/24/24   Fonda Simpers, PT, DPT Physical Therapist - [facility]  05/30/24, 5:20 PM

## 2024-05-31 ENCOUNTER — Telehealth: Payer: Self-pay | Admitting: Medical

## 2024-05-31 ENCOUNTER — Telehealth: Payer: Self-pay | Admitting: Physical Medicine and Rehabilitation

## 2024-05-31 NOTE — Telephone Encounter (Signed)
 Pt called in requesting a driving evaluation sent to [facility] center [phone]

## 2024-05-31 NOTE — Telephone Encounter (Signed)
 Copied from CRM [id number]. Topic: Referral - Request for Referral >> May 31, 2024  3:04 PM Rea C wrote: Did the patient discuss referral with their provider in the last year? Yes (If No - schedule appointment) (If Yes - send message)  Appointment offered? No  Type of order/referral and detailed reason for visit: Referral for Occupational Therapy for driving evaluation  Preference of office, provider, location: [facility]; Phone: [phone]. Fax: [phone]  If referral order, have you been seen by this specialty before? No (If Yes, this issue or another issue? When? Where?  Can we respond through MyChart? Yes

## 2024-06-01 ENCOUNTER — Ambulatory Visit: Admitting: Physical Therapy

## 2024-06-01 DIAGNOSIS — R2689 Other abnormalities of gait and mobility: Secondary | ICD-10-CM | POA: Diagnosis not present

## 2024-06-01 DIAGNOSIS — R278 Other lack of coordination: Secondary | ICD-10-CM | POA: Diagnosis not present

## 2024-06-01 DIAGNOSIS — I69354 Hemiplegia and hemiparesis following cerebral infarction affecting left non-dominant side: Secondary | ICD-10-CM | POA: Diagnosis not present

## 2024-06-01 DIAGNOSIS — R29818 Other symptoms and signs involving the nervous system: Secondary | ICD-10-CM

## 2024-06-01 DIAGNOSIS — M6281 Muscle weakness (generalized): Secondary | ICD-10-CM | POA: Diagnosis not present

## 2024-06-01 DIAGNOSIS — R2681 Unsteadiness on feet: Secondary | ICD-10-CM | POA: Diagnosis not present

## 2024-06-01 NOTE — Therapy (Addendum)
 OUTPATIENT PHYSICAL THERAPY NEURO TREATMENT/RECERT   Patient Name: Matthew Black MRN: [id number] DOB:Mar 31, 1976, 48 y.o., male Today's Date: 06/01/2024   PCP: Dorina Loving, PA-C REFERRING PROVIDER: Lorilee Sven SQUIBB, MD  END OF SESSION:   PT End of Session - 06/01/24 1319     Visit Number 21    Number of Visits 48    Date for PT Re-Evaluation 08/24/24    Authorization Type 4/17-7/10 for 24 PT visits    PT Start Time 1319    PT Stop Time 1400    PT Time Calculation (min) 41 min    Equipment Utilized During Treatment Gait belt;Other (comment)   L LE AFO   Activity Tolerance Patient tolerated treatment well    Behavior During Therapy 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   Stroke (HCC)    RICA, R A1, R MCA occlusion 07/19/19   Past Surgical History:  Procedure Laterality Date   CRANIOPLASTY Right 06/05/2020   Procedure: CRANIOPLASTY;  Surgeon: Lanis Pupa, MD;  Location: [facility] OR;  Service: Neurosurgery;  Laterality: Right;  right   CRANIOTOMY Right 07/19/2019   Procedure: RIGHT HEMI-CRANIECTOMY With implantation of skull flap to abdominal wall;  Surgeon: Lanis Pupa, MD;  Location: [facility] OR;  Service: Neurosurgery;  Laterality: Right;   CYST EXCISION N/A 10/08/2016   Procedure: EXCISION OF POSTERIOR NECK CYST;  Surgeon: Mitzie DELENA Freund, MD;  Location: [facility] ORS;  Service: General;  Laterality: N/A;   CYSTOSCOPY/URETEROSCOPY/HOLMIUM LASER/STENT PLACEMENT Right 03/16/2020   Procedure: CYSTOSCOPY RIGHT RETROGRADE  PYELOGRAM URETEROSCOPY/HOLMIUM LASER/STENT PLACEMENT;  Surgeon: Carolee Sherwood JONETTA DOUGLAS, MD;  Location: [facility] ORS;  Service: Urology;  Laterality: Right;   INCISION AND DRAINAGE ABSCESS N/A 09/22/2014   Procedure: INCISION AND DRAINAGE ABSCESS POSTERIOR NECK;  Surgeon: Donnice KATHEE Lunger, MD;  Location: [facility];  Service: General;  Laterality: N/A;   INCISION AND DRAINAGE ABSCESS N/A 12/20/2015   Procedure: INCISION AND DRAINAGE POSTERIOR NECK MASS;  Surgeon: Krystal Spinner, MD;  Location: [facility]S;  Service: General;  Laterality: N/A;   INCISION AND DRAINAGE ABSCESS Left 07/10/2004   middle finger   IR IVC FILTER PLMT / S&I /IMG GUID/MOD SED  07/21/2019   IR RADIOLOGIST EVAL & MGMT  12/14/2019   IR RADIOLOGIST EVAL & MGMT  12/21/2020   IR VENOGRAM RENAL UNI RIGHT  07/21/2019   MASS EXCISION N/A 07/21/2017   Procedure: EXCISION OF BENIGN NECK LESION WITH LAYERED CLOSURE;  Surgeon: Arelia Filippo, MD;  Location: [facility];  Service: Plastics;  Laterality: N/A;   MASS EXCISION N/A 11/10/2017   Procedure: EXCISION BENIGN LESION OF THE NECK WITH LAYERED CLOSURE;  Surgeon: Arelia Filippo, MD;  Location: [facility];  Service: Plastics;  Laterality: N/A;   Patient Active Problem List   Diagnosis Date Noted   Hx of adenomatous colonic polyps 06/01/2023   COVID-19 virus infection 04/21/2021   Left-sided weakness 04/21/2021   S/P craniotomy  06/05/2020   History of cranioplasty 06/05/2020   Peri-rectal abscess 02/14/2020   Abnormal CT scan, pelvis 02/14/2020   Pancytopenia (HCC) 12/02/2019   Nephrolithiasis 12/02/2019   Hydronephrosis with renal and ureteral calculus obstruction 12/02/2019   Rectal pain 12/02/2019   Hyperkalemia 12/02/2019   Reactive depression    Wound infection after surgery    Sleep disturbance    Dysphagia, post-stroke    Transaminitis    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   Cerebral abscess    Urinary tract infection without hematuria     Altered mental status    Primary hypercoagulable state (HCC)    Acute pulmonary embolism without acute cor pulmonale (HCC)    Deep vein thrombosis (DVT) of non-extremity vein    Hypokalemia    Acute blood loss anemia    Leukocytosis    Endotracheal tube present    Acute respiratory failure with hypoxemia (HCC)    Stroke (cerebrum) (HCC) 07/19/2019   Pressure injury of skin 07/19/2019   Acute CVA (cerebrovascular accident) (HCC)    Encephalopathy    Dysphagia    Acute encephalopathy    Essential hypertension    Obesity 03/12/2016   Scalp abscess 12/20/2015   Neck abscess 12/20/2015   Pilonidal cyst 02/08/2013    ONSET DATE: stroke in August 2020  REFERRING DIAG: I63.9 (ICD-10-CM) - Cerebrovascular accident (CVA), unspecified mechanism (HCC)   THERAPY DIAG:   Muscle weakness (generalized)  Other lack of coordination  Other abnormalities of gait and mobility  Unsteadiness on feet  Hemiplegia and hemiparesis following cerebral infarction affecting left non-dominant side (HCC)  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)  Other symptoms and signs involving the nervous system   Rationale for Evaluation and Treatment: Rehabilitation  SUBJECTIVE:                                                                                                                                                                                             SUBJECTIVE STATEMENT:  Pt reports he went to a BBQ over July 4th weekend. Pt states he did well with his mobility at the [facility], including navigating stairs using his SBQC (no handrails) mod-I. Pt excitedly states this is the first time he has been able to do this! Patient states his black shoes are still causing pain and so he is having to take them off sometimes to alleviate the discomfort. Still awaiting new shoes to arrive. Denies stumbles/falls. Denies pain to start session.  Pt reports he is  needing a driving assessment from an OT to give the DMV.  Initial Eval: Patient reports his goal is to walk without using a cane and without assistance. Patient states he wants to walk like he did before having the stroke. Reports his stroke was in August of 2020. States he currently uses a quad cane for ambulation and tries to walk as much as possible. Reports he has a manual wheelchair, but states he tries not to use it because it depresses him and makes him feel like he is back to when he first had his stroke. Reports wearing L LE custom AFO at all times.   Reports he is currently doing everything mod-I, but states he could call his mom, dad, or brothers if he needed assistance.  Reports he has routine botox  injections in L UE to help manage tone, but doesn't receive any in his LE.  Reports he has a hospital bed, but sleeps in a regular bed.  Pt accompanied by: self  PERTINENT HISTORY: L hemiparesis s/p R ACA and MCA territory CVA in August 2020, HTN, hx of kidney stones, depression  Per Neurology MD note on 11/24/2023: 44-year-old African-American male with large right hemispheric infarct due to right internal carotid and middle cerebral artery occlusion with cytotoxic edema and brain herniation s/p hemicraniectomy in 06/2019 is doing reasonably well with residual left spastic hemiparesis and left hemianopia.  Etiology of carotid occlusion unclear possibly dissection.  He had a prolonged hospital admission with several complications including DVT, pulmonary embolism, hemorrhagic transformation, abdominal wall hematoma, UTI but made quite remarkable recovery   PAIN:  Are you having pain? No, denies pain today, but states sometimes will have L shoulder pain  PRECAUTIONS: Fall  RED FLAGS: None   WEIGHT BEARING RESTRICTIONS: No  FALLS: Has patient fallen in last 6 months? Yes, stating he fell down his stairs ~5 months ago and is now scared to perform stair navigation  LIVING  ENVIRONMENT: Lives with: lives alone Lives in: House/apartment Stairs: Yes: Internal: flight steps; can reach both and External: 4 steps; can reach both, but states he doesn't have to go upstairs Has following equipment at home: Quad cane small base, Hemi walker, Wheelchair (manual), shower chair, Shower bench, and L LE custom AFO Pt has custom K5 wheelchair with rigid, contoured back support and Roho cushion. Pt reports he calls [employer] to come adjust his seat cushion when he feels it is getting uncomfortable.  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  DIAGNOSTIC FINDINGS:  EXAM: CT ANGIOGRAPHY HEAD AND NECK WITH AND WITHOUT CONTRAST IMPRESSION: 1. No acute intracranial abnormality. 2. Chronically occluded right ICA with minimal opacification of the right MCA and ACA branches. 3. Redemonstrated extensive encephalomalacia in the right MCA and ACA territories secondary to a prior infarct.  Electronically Signed   By: Lyndall Gore M.D.   On: 11/27/2023 13:45  COGNITION: Overall cognitive status: Within functional limits for tasks assessed  VISION (03/29/2024): Therapist performed visual field screen with pt appearing to have L homonomous hemianopsia - pt confirms he has loss of vision on L side.   SENSATION: Light touch: Impaired in L LE Proprioception: Impaired  in L LE  Can feel deep  pressure in L LE   COORDINATION: Impaired in L LE due to paresis, hypertonia, and overall decreased flexibility  EDEMA:  Not formally  assessed  MUSCLE TONE: LLE: Mild, Moderate, and Hypertonic  MUSCLE LENGTH: Not formally assessed; however, demonstrates L hamstring muscle tightness  DTRs:  Not formally assessed  POSTURE: rounded shoulders, forward head, and weight shift right  LOWER EXTREMITY ROM:       Right Eval Active ROM Left Eval Passive ROM  Hip flexion WFL Achieves at least 90  Hip extension    Hip abduction    Hip adduction    Hip internal rotation  Unable to achieve neutral hip rotation, lacking internal rotation  Hip external rotation  [facility] and rests in excessive ER  Knee flexion [facility]  Knee extension WFL Able to achieve terminal knee extension in supine, but with significant discomfort due to hamstring tone  Ankle dorsiflexion WFL Did not remove AFO to assess this date  Ankle plantarflexion WFL Did not remove AFO to assess this date  Ankle inversion    Ankle eversion     (Blank rows = not tested)  LOWER EXTREMITY MMT:    MMT Right Eval Left Eval  Hip flexion 4+ 2-  (compensates with hip adductor activation)  Hip extension    Hip abduction    Hip adduction  3+  Hip internal rotation  2-  Hip external rotation  2  Knee flexion 4+ 1  Knee extension 4+ 3+  Ankle dorsiflexion 4+ Did not remove AFO to assess this date  Ankle plantarflexion 4+ Did not remove AFO to assess this date  Ankle inversion    Ankle eversion    (Blank rows = not tested)  Manual Muscle Test Scale 0/5 = No muscle contraction can be seen or felt 1/5 = Contraction can be felt, but there is no motion 2-/5 = Part moves through incomplete ROM w/ gravity decreased 2/5 = Part moves through complete ROM w/ gravity decreased 2+/5 = Part moves through incomplete ROM (<50%) against gravity or through complete ROM w/ gravity 3-/5 = Part moves through incomplete ROM (>50%) against gravity 3/5 = Part moves through complete ROM against gravity 3+/5 = Part moves through complete ROM against gravity/slight resistance 4-/5=  Holds test position against slight to moderate pressure 4/5 = Part moves through complete ROM against gravity/moderate resistance 4+/5= Holds test position against moderate to strong pressure 5/5 = Part moves through complete ROM against gravity/full resistance  BED MOBILITY: (sleeps in regular bed, but has hospital bed available) Findings: Sit to supine CGA Supine to sit Min A and Mod A Rolling to Right Mod A Rolling to Left Min A and Mod A  TRANSFERS: Sit to stand: CGA and Min A  Assistive device utilized: Counselling psychologist     Stand to sit: CGA and Min A  Assistive device utilized: Tree surgeon to chair: Min A  Assistive device utilized: Counselling psychologist       RAMP:  Not tested  CURB:  Not tested  STAIRS: Not tested *need to assess*  GAIT: Findings: advances L LE with excessive hip external rotation using hip adductors, step to pattern, decreased arm swing- Left, decreased step length- Right, decreased stance time- Left, decreased stride length, decreased hip/knee flexion- Left, decreased ankle dorsiflexion- Left, lateral lean- Right, and decreased trunk rotation Distance walked: ~74ft Assistive device utilized: small based quad cane in R UE  Level of  assistance: CGA and Min A  FUNCTIONAL TESTS:  5 times sit to stand: need to assess Timed up and go (TUG): need to assess 6 minute walk test: need to assess 10 meter walk test: 0.32m/s using small based quad cane and CGA Berg Balance Scale: need to assess  PATIENT SURVEYS:  Stroke Impact Scale 03/15/2024: 49/80                                                                                                                               TREATMENT DATE: 06/01/2024  Gait training ~137ft into clinic using [facility] with supervision - continues to demo excessive L LE hip external rotation and reverting back to decreased consistency of reciprocal pattern at this time.  Unless otherwise stated, CGA was provided  and gait belt donned in order to ensure pt safety throughout session.   Patient participated in [facility] and demonstrates increased fall risk as noted by score of 25/56 from EOM.  (<36= high risk for falls, close to 100%; 37-45 significant >80%; 46-51 moderate >50%; 52-55 lower >25%).   [facility] PT Assessment - 06/01/24 0001       Berg Balance Test   Sit to Stand Able to stand  independently using hands    Standing Unsupported Able to stand 2 minutes with supervision    Sitting with Back Unsupported but Feet Supported on Floor or Stool Able to sit safely and securely 2 minutes    Stand to Sit Controls descent by using hands    Transfers Able to transfer with verbal cueing and /or supervision   EOM<>chair with armrests   Standing Unsupported with Eyes Closed Able to stand 10 seconds with supervision    Standing Unsupported with Feet Together Needs help to attain position but able to stand for 30 seconds with feet together   requires assistance to place feet together, L LE remains excessively ER but heels are together   From Standing, Reach Forward with Outstretched Arm Reaches forward but needs supervision    From Standing Position, Pick up Object from Floor Able to pick up shoe, needs supervision    From Standing Position, Turn to Look Behind Over each Shoulder Needs supervision when turning    Turn 360 Degrees Needs assistance while turning    Standing Unsupported, Alternately Place Feet on Step/Stool Needs assistance to keep from falling or unable to try   now able to tap L foot up on step, but not able to stand on L LE in order to tap R foot   Standing Unsupported, One Foot in [employer] balance while stepping or standing    Standing on One Leg Tries to lift leg/unable to hold 3 seconds but remains standing independently    Total Score 25         Gait training additional ~18ft + ~117ft during session using SBQC with skilled cuing focused on increased L LE stance phase time,  improved L  glute activation during stance to promote further hip extension and upright posture, cuing for increased R step length.   Standing L LE NMR for improved stance phase control focusing on increased L glute activation with R UE support on balance bar progressed towards R fingertip support only  X8 reps Skilled light min A for guarding L knee and facilitating increased L glute activation and hip extension     PATIENT EDUCATION: Education details: Therapy POC, LTGs, HEP, importance of improving L LE flexibility, findings during assessment  Person educated: Patient Education method: Explanation and Handouts Education comprehension: verbalized understanding and needs further education  HOME EXERCISE PROGRAM:  Access Code: [email or web address] URL: [email or web address] Date: 03/10/2024 Prepared by: Connell Kiss  Exercises - Seated Hip Adduction Isometrics with Ball  - 1 x daily - 7 x weekly - 2 sets - 10 reps - 5 seconds hold - Supine Quadricep Sets  - 1 x daily - 7 x weekly - 2 sets - 10 reps - 3 seconds hold   GOALS: Goals reviewed with patient? Yes  SHORT TERM GOALS: Target date: 07/13/2024  Patient will be independent in home exercise program to improve strength/mobility for better functional independence with ADLs.  Baseline: initiated on 03/10/2024 04/14/2024: continued Goal status: IN PROGRESS   LONG TERM GOALS: Target date: 08/24/2024  Patient (< 73 years old) will complete five times sit to stand (5XSTS) test in < 10 seconds indicating an increased LE strength and improved balance.  Baseline: 03/15/2024: 16.53 seconds from standard green chair relying on R hand to push-up from armrest and pt's personal SBQC nearby, but not using it VERSUS 25.85 seconds with R hand across chest, with min A for balance/safety without use of UE support 04/14/2024: 8.96 seconds using R UE support to push-up from armrest and pt's SBQC next to him, 13.75 seconds with R hand across chest  with min A for therapist to stabilize chair behind him 05/30/24: 13.01 sec with R UE support on chair; 15.26 sec with R hand across chest and minA from therapist to stabilize chair behind pt Goal status: IN PROGRESS  2.  Patient will increase Berg Balance score to > 45/56 to demonstrate improved balance and decreased fall risk during functional activities and ADLs.  Baseline: 03/15/2024: 14/56 04/14/2024: 22/56 05/30/24: TBD at next visit Goal status: IN PROGRESS  3.  Patient will increase 10 meter walk test to >1.85m/s using LRAD as to improve gait speed for better community ambulation and to reduce fall risk.  Baseline: 0.30 m/s using small based quad cane with CGA and 1x light min A due to balance instability  04/14/2024: 0.405 m/s using SBQC with close SBA for safety 05/30/24: 0.379 m/s using SBQC with close SBA for safety Goal status: IN PROGRESS  4.  Patient will increase six minute walk test distance to >582ft for progression towards community ambulator and improve gait ability  Baseline: 230ft with [facility]  04/20/2024:  257ft with SBQC and SBA 05/30/24: 360 ft with SBQC Goal status: IN PROGRESS  5.  Patient will reduce timed up and go to <15 seconds to reduce fall risk and demonstrate improved transfer/gait ability. Baseline: 03/15/2024: 25.32 seconds using SBQC and chair with armrest as well as CGA for safety/steadying 04/14/2024: 22.25 seconds using SBQC & chair w/ armrest and close SBA for safety 05/30/24: 26.87 seconds w/ SBQC and use  Goal status: IN PROGRESS  6.  Patient will ascend/descend 4 stairs, using railing, independently without loss of balance to improve ability to  get in/out of home.   Baseline: 03/31/2024: using R UE support on each HR with skilled min assist for balance using primarily step-to pattern 04/20/2024:  using R UE support on each HR with skilled CGA for safety using step-to pattern 05/30/24: continued use of R UE support on HR with skilled CGA for safety using step-to  pattern Goal status: IN PROGRESS  ASSESSMENT:  CLINICAL IMPRESSION:    Therapy session focused on re-assessment of final standardized outcome measure to determine pt's progress with standing balance. Patient demonstrates improvement on Berg due to improved ability to complete transfers with increased independence and decreased reliance on R UE support. Therapy session also focused on improved L LE glute activation during stance phase to improve upright posture and allow increased R LE step length for more consistent reciprocal stepping pattern. Patient remains highly motivated to participate in therapy and improve his independence with functional mobility.  Pt will continue to benefit from skilled therapy to address remaining deficits in order to improve overall QoL and return to PLOF.     OBJECTIVE IMPAIRMENTS: Abnormal gait, cardiopulmonary status limiting activity, decreased activity tolerance, decreased balance, decreased coordination, decreased endurance, decreased knowledge of use of DME, decreased mobility, difficulty walking, decreased ROM, decreased strength, hypomobility, impaired flexibility, impaired sensation, impaired tone, impaired UE functional use, improper body mechanics, postural dysfunction, and pain.   ACTIVITY LIMITATIONS: carrying, lifting, bending, standing, squatting, sleeping, stairs, transfers, bed mobility, continence, bathing, toileting, dressing, reach over head, hygiene/grooming, locomotion level, and caring for others  PARTICIPATION LIMITATIONS: meal prep, cleaning, laundry, medication management, shopping, community activity, and yard work  PERSONAL FACTORS: Age, Past/current experiences, Time since onset of injury/illness/exacerbation, Transportation, and 3+ comorbidities: HTN, hx of kidney stones, depression are also affecting patient's functional outcome.   REHAB POTENTIAL: Good  CLINICAL DECISION MAKING: Evolving/moderate complexity  EVALUATION COMPLEXITY:  Moderate  PLAN:  PT FREQUENCY: 1-2x/week  PT DURATION: 12 weeks  PLANNED INTERVENTIONS: 97164- PT Re-evaluation, 97750- Physical Performance Testing, 97110-Therapeutic exercises, 97530- Therapeutic activity, [id number]- Neuromuscular re-education, 97535- Self Care, [id number]- Manual therapy, [id number]- Gait training, [phone]- Orthotic/Prosthetic subsequent, [phone]- Canalith repositioning, [phone]- Electrical stimulation (manual), Patient/Family education, Balance training, Stair training, Taping, Dry Needling, Joint mobilization, Joint manipulation, Vestibular training, Visual/preceptual remediation/compensation, DME instructions, Cryotherapy, Moist heat, and Biofeedback  PLAN FOR NEXT SESSION:   Progress HEP   - high intensity gait training using +2 HHA vs litegait support vs Lofstrand crutch  - continue adding 7lb AW to L LE when appropriate vs decreasing RUE support/reliance while in litegait  - continue use of litegait harness in upcoming visits to decrease reliance on R UE support - L LE stretching and weightbearing for tone management and to improve alignment during gait/standing  - bridges with R foot placed on unstable surface to promote increased L LE wt bearing - stair navigation for L LE NMR - progress HEP - BOTOX  appointment 06/24/24   Connell Kiss, PT, DPT, NCS, CSRS Physical Therapist - [facility]  2:09 PM 06/01/24

## 2024-06-01 NOTE — Addendum Note (Signed)
 Addended by: DORINA DALLAS HERO on: 06/01/2024 04:40 PM   Modules accepted: Orders

## 2024-06-06 ENCOUNTER — Ambulatory Visit: Admitting: Physical Therapy

## 2024-06-06 DIAGNOSIS — R2689 Other abnormalities of gait and mobility: Secondary | ICD-10-CM

## 2024-06-06 DIAGNOSIS — M6281 Muscle weakness (generalized): Secondary | ICD-10-CM

## 2024-06-06 DIAGNOSIS — R2681 Unsteadiness on feet: Secondary | ICD-10-CM | POA: Diagnosis not present

## 2024-06-06 DIAGNOSIS — I69354 Hemiplegia and hemiparesis following cerebral infarction affecting left non-dominant side: Secondary | ICD-10-CM

## 2024-06-06 DIAGNOSIS — R278 Other lack of coordination: Secondary | ICD-10-CM | POA: Diagnosis not present

## 2024-06-06 DIAGNOSIS — R29818 Other symptoms and signs involving the nervous system: Secondary | ICD-10-CM | POA: Diagnosis not present

## 2024-06-06 NOTE — Therapy (Signed)
 OUTPATIENT PHYSICAL THERAPY NEURO TREATMENT   Patient Name: Matthew Black MRN: [id number] DOB:[birth year], 48 y.o., male Today's Date: 06/06/2024   PCP: Dorina Loving, PA-C REFERRING PROVIDER: Lorilee Sven SQUIBB, MD  END OF SESSION:   PT End of Session - 06/06/24 1617     Visit Number 22    Number of Visits 48    Date for PT Re-Evaluation 08/24/24    Authorization Type 4/17-7/10 for 24 PT visits    PT Start Time 1617    PT Stop Time 1703    PT Time Calculation (min) 46 min    Equipment Utilized During Treatment Gait belt;Other (comment)   L LE AFO   Activity Tolerance Patient tolerated treatment well    Behavior During Therapy [facility] for tasks assessed/performed              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   Stroke (HCC)    RICA, R A1, R MCA occlusion 07/19/19   Past Surgical History:  Procedure Laterality Date   CRANIOPLASTY Right 06/05/2020   Procedure: CRANIOPLASTY;  Surgeon: Lanis Pupa, MD;  Location: [facility] OR;  Service: Neurosurgery;  Laterality: Right;  right   CRANIOTOMY Right 07/19/2019   Procedure: RIGHT HEMI-CRANIECTOMY With implantation of skull flap to abdominal wall;  Surgeon: Lanis Pupa, MD;  Location: [facility] OR;  Service: Neurosurgery;  Laterality: Right;   CYST EXCISION N/A 10/08/2016   Procedure: EXCISION OF POSTERIOR NECK CYST;  Surgeon: Mitzie DELENA Freund, MD;  Location: [facility];  Service: General;  Laterality: N/A;   CYSTOSCOPY/URETEROSCOPY/HOLMIUM LASER/STENT PLACEMENT Right 03/16/2020   Procedure: CYSTOSCOPY RIGHT RETROGRADE  PYELOGRAM URETEROSCOPY/HOLMIUM LASER/STENT PLACEMENT;  Surgeon: Carolee Sherwood JONETTA DOUGLAS, MD;  Location: [facility];  Service: Urology;  Laterality: Right;   INCISION AND DRAINAGE ABSCESS N/A 09/22/2014   Procedure: INCISION AND DRAINAGE ABSCESS POSTERIOR NECK;  Surgeon: Donnice KATHEE Lunger, MD;  Location: [facility];  Service: General;  Laterality: N/A;   INCISION AND DRAINAGE ABSCESS N/A 12/20/2015   Procedure: INCISION AND DRAINAGE POSTERIOR NECK MASS;  Surgeon: Krystal Spinner, MD;  Location: [facility];  Service: General;  Laterality: N/A;   INCISION AND DRAINAGE ABSCESS Left 07/10/2004   middle finger   IR IVC FILTER PLMT / S&I /IMG GUID/MOD SED  07/21/2019   IR RADIOLOGIST EVAL & MGMT  12/14/2019   IR RADIOLOGIST EVAL & MGMT  12/21/2020   IR VENOGRAM RENAL UNI RIGHT  07/21/2019   MASS EXCISION N/A 07/21/2017   Procedure: EXCISION OF BENIGN NECK LESION WITH LAYERED CLOSURE;  Surgeon: Arelia Filippo, MD;  Location: [facility];  Service: Plastics;  Laterality: N/A;   MASS EXCISION N/A 11/10/2017   Procedure: EXCISION BENIGN LESION OF THE NECK WITH LAYERED CLOSURE;  Surgeon: Arelia Filippo, MD;  Location: [facility];  Service: Plastics;  Laterality: N/A;   Patient Active Problem List   Diagnosis Date Noted   Hx of adenomatous colonic polyps 06/01/2023   COVID-19 virus infection 04/21/2021   Left-sided weakness 04/21/2021   S/P  craniotomy 06/05/2020   History of cranioplasty 06/05/2020   Peri-rectal abscess 02/14/2020   Abnormal CT scan, pelvis 02/14/2020   Pancytopenia (HCC) 12/02/2019   Nephrolithiasis 12/02/2019   Hydronephrosis with renal and ureteral calculus obstruction 12/02/2019   Rectal pain 12/02/2019   Hyperkalemia 12/02/2019   Reactive depression    Wound infection after surgery    Sleep disturbance    Dysphagia, post-stroke    Transaminitis    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   Cerebral abscess    Urinary tract infection without hematuria     Altered mental status    Primary hypercoagulable state (HCC)    Acute pulmonary embolism without acute cor pulmonale (HCC)    Deep vein thrombosis (DVT) of non-extremity vein    Hypokalemia    Acute blood loss anemia    Leukocytosis    Endotracheal tube present    Acute respiratory failure with hypoxemia (HCC)    Stroke (cerebrum) (HCC) 07/19/2019   Pressure injury of skin 07/19/2019   Acute CVA (cerebrovascular accident) (HCC)    Encephalopathy    Dysphagia    Acute encephalopathy    Essential hypertension    Obesity 03/12/2016   Scalp abscess 12/20/2015   Neck abscess 12/20/2015   Pilonidal cyst 02/08/2013    ONSET DATE: stroke in August 2020  REFERRING DIAG: I63.9 (ICD-10-CM) - Cerebrovascular accident (CVA), unspecified mechanism (HCC)   THERAPY DIAG:   Muscle weakness (generalized)  Other lack of coordination  Other abnormalities of gait and mobility  Unsteadiness on feet  Hemiplegia and hemiparesis following cerebral infarction affecting left non-dominant side (HCC)  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)  Other symptoms and signs involving the nervous system   Rationale for Evaluation and Treatment: Rehabilitation  SUBJECTIVE:                                                                                                                                                                                             SUBJECTIVE STATEMENT:  Pt excitedly states he had someone at the stroke support group tell him he is walking faster!. Pt reports he wore himself out walking down to the therapy clinic today pushing the transport chair. Denies pain to start session. Denies stumbles/falls.    Initial Eval: Patient reports his goal is to walk without using a cane and without assistance. Patient states he wants to walk like he did before having the stroke. Reports his stroke was in August of 2020. States he currently uses  a quad cane for ambulation and tries to walk as much as possible.  Reports he has a manual wheelchair, but states he tries not to use it because it depresses him and makes him feel like he is back to when he first had his stroke. Reports wearing L LE custom AFO at all times.   Reports he is currently doing everything mod-I, but states he could call his mom, dad, or brothers if he needed assistance.  Reports he has routine botox  injections in L UE to help manage tone, but doesn't receive any in his LE.  Reports he has a hospital bed, but sleeps in a regular bed.  Pt accompanied by: self  PERTINENT HISTORY: L hemiparesis s/p R ACA and MCA territory CVA in August 2020, HTN, hx of kidney stones, depression  Per Neurology MD note on 11/24/2023: 44-year-old African-American male with large right hemispheric infarct due to right internal carotid and middle cerebral artery occlusion with cytotoxic edema and brain herniation s/p hemicraniectomy in 06/2019 is doing reasonably well with residual left spastic hemiparesis and left hemianopia.  Etiology of carotid occlusion unclear possibly dissection.  He had a prolonged hospital admission with several complications including DVT, pulmonary embolism, hemorrhagic transformation, abdominal wall hematoma, UTI but made quite remarkable recovery   PAIN:  Are you having pain? No, denies pain today, but states sometimes will have L shoulder pain  PRECAUTIONS: Fall  RED FLAGS: None   WEIGHT BEARING RESTRICTIONS: No  FALLS: Has patient fallen in last 6 months? Yes, stating he fell down his stairs ~5 months ago and is now scared to perform stair navigation  LIVING ENVIRONMENT: Lives with: lives alone Lives in: House/apartment Stairs: Yes: Internal: flight steps; can reach both and External: 4 steps; can reach both, but states he doesn't have to go upstairs Has following equipment at home: Quad cane small base, Hemi walker, Wheelchair (manual), shower chair,  Shower bench, and L LE custom AFO Pt has custom K5 wheelchair with rigid, contoured back support and Roho cushion. Pt reports he calls [employer] to come adjust his seat cushion when he feels it is getting uncomfortable.  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  DIAGNOSTIC FINDINGS:  EXAM: CT ANGIOGRAPHY HEAD AND NECK WITH AND WITHOUT CONTRAST IMPRESSION: 1. No acute intracranial abnormality. 2. Chronically occluded right ICA with minimal opacification of the right MCA and ACA branches. 3. Redemonstrated extensive encephalomalacia in the right MCA and ACA territories secondary to a prior infarct.  Electronically Signed   By: Lyndall Gore M.D.   On: 11/27/2023 13:45  COGNITION: Overall cognitive status: Within functional limits for tasks assessed  VISION (03/29/2024): Therapist performed visual field screen with pt appearing to have L homonomous hemianopsia - pt confirms he has loss of vision on L side.   SENSATION: Light touch: Impaired in L LE Proprioception: Impaired  in L LE  Can feel deep pressure in L LE   COORDINATION: Impaired in L LE due to paresis, hypertonia, and overall decreased flexibility  EDEMA:  Not formally assessed  MUSCLE TONE: LLE: Mild, Moderate, and Hypertonic  MUSCLE LENGTH: Not formally assessed; however, demonstrates L hamstring muscle tightness  DTRs:  Not formally assessed  POSTURE: rounded  shoulders, forward head, and weight shift right  LOWER EXTREMITY ROM:       Right Eval Active ROM  Left Eval Passive ROM  Hip flexion WFL Achieves at least 90  Hip extension    Hip abduction    Hip adduction    Hip internal rotation  Unable to achieve neutral hip rotation, lacking internal rotation  Hip external rotation  [facility] and rests in excessive ER  Knee flexion [facility]  Knee extension WFL Able to achieve terminal knee extension in supine, but with significant discomfort due to hamstring tone  Ankle dorsiflexion WFL Did not remove AFO to assess this date  Ankle plantarflexion WFL Did not remove AFO to assess this date  Ankle inversion    Ankle eversion     (Blank rows = not tested)  LOWER EXTREMITY MMT:    MMT Right Eval Left Eval  Hip flexion 4+ 2-  (compensates with hip adductor activation)  Hip extension    Hip abduction    Hip adduction  3+  Hip internal rotation  2-  Hip external rotation  2  Knee flexion 4+ 1  Knee extension 4+ 3+  Ankle dorsiflexion 4+ Did not remove AFO to assess this date  Ankle plantarflexion 4+ Did not remove AFO to assess this date  Ankle inversion    Ankle eversion    (Blank rows = not tested)  Manual Muscle Test Scale 0/5 = No muscle contraction can be seen or felt 1/5 = Contraction can be felt, but there is no motion 2-/5 = Part moves through incomplete ROM w/ gravity decreased 2/5 = Part moves through complete ROM w/ gravity decreased 2+/5 = Part moves through incomplete ROM (<50%) against gravity or through complete ROM w/ gravity 3-/5 = Part moves through incomplete ROM (>50%) against gravity 3/5 = Part moves through complete ROM against gravity 3+/5 = Part moves through complete ROM against gravity/slight resistance 4-/5= Holds test position against slight to moderate pressure 4/5 = Part moves through complete ROM against gravity/moderate resistance 4+/5= Holds test position against moderate to strong pressure 5/5 = Part moves through complete ROM against gravity/full resistance  BED MOBILITY: (sleeps in regular bed, but has  hospital bed available) Findings: Sit to supine CGA Supine to sit Min A and Mod A Rolling to Right Mod A Rolling to Left Min A and Mod A  TRANSFERS: Sit to stand: CGA and Min A  Assistive device utilized: Counselling psychologist     Stand to sit: CGA and Min A  Assistive device utilized: Tree surgeon to chair: Min A  Assistive device utilized: Counselling psychologist       RAMP:  Not tested  CURB:  Not tested  STAIRS: Not tested *need to assess*  GAIT: Findings: advances L LE with excessive hip external rotation using hip adductors, step to pattern, decreased arm swing- Left, decreased step length- Right, decreased stance time- Left, decreased stride length, decreased hip/knee flexion- Left, decreased ankle dorsiflexion- Left, lateral lean- Right, and decreased trunk rotation Distance walked: ~37ft Assistive device utilized: small based quad cane in R UE  Level of assistance: CGA and Min A  FUNCTIONAL TESTS:  5 times sit to stand: need to assess Timed up and go (TUG): need to assess 6 minute walk test: need to assess 10 meter walk test: 0.71m/s using small based quad cane and CGA Berg Balance Scale: need to assess  PATIENT SURVEYS:  Stroke Impact Scale 03/15/2024: 49/80                                                                                                                               TREATMENT DATE: 06/06/2024  Gait training ~166ft into therapy clinic using SBQC with SBA - pt demos increased step lengths bilaterally with reciprocal pattern and improving gait speed along with longer L stance phase; however, continues to have L LE mild to moderately excessively externally rotated.   Pt agreeable to continue gait training with use of litegait harness for increased dynamic challenge in safe set-up.    Donned litegait harness in standing with supervision using R UE support on litegait handle.   Gait training 2 laps (~353ft) +1 lap (~19ft) (seated break  between) in litegait harness WITHOUT BWS and therapist assisting with managing the litegait. Pt demonstrating the following gait deviations with therapist providing the described cuing and facilitation for improvement:  R UE support on litegait handle  Progressed to no UE support for ~60ft of continuous gait during each lap! Without UE support pt does demo shorter L stance phase with smaller step lengths bilaterally (especially on R); however, is able to maintain slight reciprocal stepping pattern Improving R LE step length with increased repetition and pt increasing confidence ambulating without UE support, until he becomes fatigued After seated rest break, donned 6lb AW to L LE for 2nd gait trial, using R UE support throughout  Provided increased facilitation to improve L hip alignment throughout (to avoid pelvis rotation towards L with L hip posterior to R) and pt able to ambulate with L foot completely straight ahead! (Not excessively ER!) Therapist facilitating increased L wt shift and longer L stance phase time Performed ~50ft of backwards gait at end of 2nd gait trial with pt performing primarily step-to pattern leading with R LE and having poor control of backwards momentum Will benefit from attempting this again in future visit   Gait training additional ~267ft up to hospital entrance (including ambulating on/off elevator) with focus of carrying over improved gait mechanics in real-life community level setting. Therapist providing min manual facilitation with verbal cuing for increased pelvis rotation towards R hip to decrease L LE excessively externally rotated with pt demonstrating improvement in hip/knee flexion! Pt able to complete reciprocal stepping pattern 100% of the time, but requires cuing for increased symmetry as still reverts back to L step being longer than R.    PATIENT EDUCATION: Education details: Therapy POC, LTGs, HEP, importance of improving L LE flexibility, findings  during assessment  Person educated: Patient Education method: Explanation and Handouts Education comprehension: verbalized understanding and needs further education  HOME EXERCISE PROGRAM:  Access Code: [email or web address] URL: [email or web address] Date: 03/10/2024 Prepared by: Connell Kiss  Exercises - Seated Hip Adduction Isometrics with Ball  - 1 x daily - 7 x weekly - 2 sets - 10 reps - 5 seconds hold - Supine Quadricep Sets  -  1 x daily - 7 x weekly - 2 sets - 10 reps - 3 seconds hold   GOALS: Goals reviewed with patient? Yes  SHORT TERM GOALS: Target date: 07/13/2024  Patient will be independent in home exercise program to improve strength/mobility for better functional independence with ADLs.  Baseline: initiated on 03/10/2024 04/14/2024: continued Goal status: IN PROGRESS   LONG TERM GOALS: Target date: 08/24/2024  Patient (< 64 years old) will complete five times sit to stand (5XSTS) test in < 10 seconds indicating an increased LE strength and improved balance.  Baseline: 03/15/2024: 16.53 seconds from standard green chair relying on R hand to push-up from armrest and pt's personal SBQC nearby, but not using it VERSUS 25.85 seconds with R hand across chest, with min A for balance/safety without use of UE support 04/14/2024: 8.96 seconds using R UE support to push-up from armrest and pt's SBQC next to him, 13.75 seconds with R hand across chest with min A for therapist to stabilize chair behind him 05/30/24: 13.01 sec with R UE support on chair; 15.26 sec with R hand across chest and minA from therapist to stabilize chair behind pt Goal status: IN PROGRESS  2.  Patient will increase Berg Balance score to > 45/56 to demonstrate improved balance and decreased fall risk during functional activities and ADLs.  Baseline: 03/15/2024: 14/56 04/14/2024: 22/56 05/30/24: TBD at next visit Goal status: IN PROGRESS  3.  Patient will increase 10 meter walk test to >1.71m/s using LRAD as  to improve gait speed for better community ambulation and to reduce fall risk.  Baseline: 0.30 m/s using small based quad cane with CGA and 1x light min A due to balance instability  04/14/2024: 0.405 m/s using SBQC with close SBA for safety 05/30/24: 0.379 m/s using SBQC with close SBA for safety Goal status: IN PROGRESS  4.  Patient will increase six minute walk test distance to >526ft for progression towards community ambulator and improve gait ability  Baseline: 253ft with [facility]  04/20/2024:  240ft with SBQC and SBA 05/30/24: 360 ft with SBQC Goal status: IN PROGRESS  5.  Patient will reduce timed up and go to <15 seconds to reduce fall risk and demonstrate improved transfer/gait ability. Baseline: 03/15/2024: 25.32 seconds using SBQC and chair with armrest as well as CGA for safety/steadying 04/14/2024: 22.25 seconds using SBQC & chair w/ armrest and close SBA for safety 05/30/24: 26.87 seconds w/ SBQC and use  Goal status: IN PROGRESS  6.  Patient will ascend/descend 4 stairs, using railing, independently without loss of balance to improve ability to get in/out of home.   Baseline: 03/31/2024: using R UE support on each HR with skilled min assist for balance using primarily step-to pattern 04/20/2024:  using R UE support on each HR with skilled CGA for safety using step-to pattern 05/30/24: continued use of R UE support on HR with skilled CGA for safety using step-to pattern Goal status: IN PROGRESS  ASSESSMENT:  CLINICAL IMPRESSION:    Therapy session focused on dynamic gait training with use of litegait harness to allow safe challenges with decreased reliance on R UE. Patient demonstrates ability to ambulate longer distance without UE support today as well as overall increased gait distance prior to seated rest break! Patient also demonstrates ability to successfully advance L LE during swing while wearing 6lb AW and therapist facilitating improved pelvis alignment to prevent excessive rotation  towards L with pt demonstrating improving activation of hip/knee flexors during swing! Patient remains  highly motivated to participate in therapy and improve his independence with functional mobility.  Pt will continue to benefit from skilled therapy to address remaining deficits in order to improve overall QoL and return to PLOF.     OBJECTIVE IMPAIRMENTS: Abnormal gait, cardiopulmonary status limiting activity, decreased activity tolerance, decreased balance, decreased coordination, decreased endurance, decreased knowledge of use of DME, decreased mobility, difficulty walking, decreased ROM, decreased strength, hypomobility, impaired flexibility, impaired sensation, impaired tone, impaired UE functional use, improper body mechanics, postural dysfunction, and pain.   ACTIVITY LIMITATIONS: carrying, lifting, bending, standing, squatting, sleeping, stairs, transfers, bed mobility, continence, bathing, toileting, dressing, reach over head, hygiene/grooming, locomotion level, and caring for others  PARTICIPATION LIMITATIONS: meal prep, cleaning, laundry, medication management, shopping, community activity, and yard work  PERSONAL FACTORS: Age, Past/current experiences, Time since onset of injury/illness/exacerbation, Transportation, and 3+ comorbidities: HTN, hx of kidney stones, depression are also affecting patient's functional outcome.   REHAB POTENTIAL: Good  CLINICAL DECISION MAKING: Evolving/moderate complexity  EVALUATION COMPLEXITY: Moderate  PLAN:  PT FREQUENCY: 1-2x/week  PT DURATION: 12 weeks  PLANNED INTERVENTIONS: 97164- PT Re-evaluation, 97750- Physical Performance Testing, 97110-Therapeutic exercises, 97530- Therapeutic activity, [id number]- Neuromuscular re-education, 97535- Self Care, [id number]- Manual therapy, [id number]- Gait training, [phone]- Orthotic/Prosthetic subsequent, [phone]- Canalith repositioning, [phone]- Electrical stimulation (manual), Patient/Family education, Balance training,  Stair training, Taping, Dry Needling, Joint mobilization, Joint manipulation, Vestibular training, Visual/preceptual remediation/compensation, DME instructions, Cryotherapy, Moist heat, and Biofeedback  PLAN FOR NEXT SESSION:   Progress HEP  - high intensity gait training using +2 HHA vs litegait support vs Lofstrand crutch  - continue adding 7lb AW to L LE when appropriate vs decreasing RUE support/reliance while in litegait  - continue use of litegait harness in upcoming visits to decrease reliance on R UE support - L LE stretching and weightbearing for tone management and to improve alignment during gait/standing  - bridges with R foot placed on unstable surface to promote increased L LE wt bearing - stair navigation for L LE NMR - progress HEP - BOTOX  appointment 06/24/24     Connell Kiss, PT, DPT, NCS, CSRS Physical Therapist - [facility]  5:33 PM 06/06/24

## 2024-06-07 NOTE — Telephone Encounter (Signed)
 Pt request came from internal e2c2 call .SABRA Referral placed and sent to [facility] with dx of history of stroke.SABRA No further follow up is required

## 2024-06-08 ENCOUNTER — Ambulatory Visit

## 2024-06-08 DIAGNOSIS — R2681 Unsteadiness on feet: Secondary | ICD-10-CM

## 2024-06-08 DIAGNOSIS — I69354 Hemiplegia and hemiparesis following cerebral infarction affecting left non-dominant side: Secondary | ICD-10-CM | POA: Diagnosis not present

## 2024-06-08 DIAGNOSIS — R278 Other lack of coordination: Secondary | ICD-10-CM | POA: Diagnosis not present

## 2024-06-08 DIAGNOSIS — M6281 Muscle weakness (generalized): Secondary | ICD-10-CM | POA: Diagnosis not present

## 2024-06-08 DIAGNOSIS — R2689 Other abnormalities of gait and mobility: Secondary | ICD-10-CM | POA: Diagnosis not present

## 2024-06-08 DIAGNOSIS — R29818 Other symptoms and signs involving the nervous system: Secondary | ICD-10-CM | POA: Diagnosis not present

## 2024-06-08 NOTE — Therapy (Unsigned)
 OUTPATIENT PHYSICAL THERAPY NEURO TREATMENT   Patient Name: Matthew Black MRN: [id number] DOB:1976/02/27, 48 y.o., male Today's Date: 06/09/2024   PCP: Dorina Loving, PA-C REFERRING PROVIDER: Lorilee Sven SQUIBB, MD  END OF SESSION:   PT End of Session - 06/08/24 1534     Visit Number 23    Number of Visits 48    Date for PT Re-Evaluation 08/24/24    Authorization Type 4/17-7/10 for 24 PT visits    PT Start Time 1328    PT Stop Time 1400    PT Time Calculation (min) 32 min    Equipment Utilized During Treatment Gait belt;Other (comment)   L LE AFO   Activity Tolerance Patient tolerated treatment well    Behavior During Therapy [facility] for tasks assessed/performed              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   Stroke (HCC)    RICA, R A1, R MCA occlusion 07/19/19   Past Surgical History:  Procedure Laterality Date   CRANIOPLASTY Right 06/05/2020   Procedure: CRANIOPLASTY;  Surgeon: Lanis Pupa, MD;  Location: [facility] OR;  Service: Neurosurgery;  Laterality: Right;  right   CRANIOTOMY Right 07/19/2019   Procedure: RIGHT HEMI-CRANIECTOMY With implantation of skull flap to abdominal wall;  Surgeon: Lanis Pupa, MD;  Location: [facility] OR;  Service: Neurosurgery;  Laterality: Right;   CYST EXCISION N/A 10/08/2016   Procedure: EXCISION OF POSTERIOR NECK CYST;  Surgeon: Mitzie DELENA Freund, MD;  Location: [facility] ORS;  Service: General;  Laterality: N/A;   CYSTOSCOPY/URETEROSCOPY/HOLMIUM LASER/STENT PLACEMENT Right 03/16/2020   Procedure: CYSTOSCOPY RIGHT RETROGRADE  PYELOGRAM URETEROSCOPY/HOLMIUM LASER/STENT PLACEMENT;  Surgeon: Carolee Sherwood JONETTA DOUGLAS, MD;  Location: [facility]S;  Service: Urology;  Laterality: Right;   INCISION AND DRAINAGE ABSCESS N/A 09/22/2014   Procedure: INCISION AND DRAINAGE ABSCESS POSTERIOR NECK;  Surgeon: Donnice KATHEE Lunger, MD;  Location: [facility];  Service: General;  Laterality: N/A;   INCISION AND DRAINAGE ABSCESS N/A 12/20/2015   Procedure: INCISION AND DRAINAGE POSTERIOR NECK MASS;  Surgeon: Krystal Spinner, MD;  Location: [facility];  Service: General;  Laterality: N/A;   INCISION AND DRAINAGE ABSCESS Left 07/10/2004   middle finger   IR IVC FILTER PLMT / S&I /IMG GUID/MOD SED  07/21/2019   IR RADIOLOGIST EVAL & MGMT  12/14/2019   IR RADIOLOGIST EVAL & MGMT  12/21/2020   IR VENOGRAM RENAL UNI RIGHT  07/21/2019   MASS EXCISION N/A 07/21/2017   Procedure: EXCISION OF BENIGN NECK LESION WITH LAYERED CLOSURE;  Surgeon: Arelia Filippo, MD;  Location: [facility];  Service: Plastics;  Laterality: N/A;   MASS EXCISION N/A 11/10/2017   Procedure: EXCISION BENIGN LESION OF THE NECK WITH LAYERED CLOSURE;  Surgeon: Arelia Filippo, MD;  Location: [facility];  Service: Plastics;  Laterality: N/A;   Patient Active Problem List   Diagnosis Date Noted   Hx of adenomatous colonic polyps 06/01/2023   COVID-19 virus infection 04/21/2021   Left-sided weakness 04/21/2021   S/P  craniotomy 06/05/2020   History of cranioplasty 06/05/2020   Peri-rectal abscess 02/14/2020   Abnormal CT scan, pelvis 02/14/2020   Pancytopenia (HCC) 12/02/2019   Nephrolithiasis 12/02/2019   Hydronephrosis with renal and ureteral calculus obstruction 12/02/2019   Rectal pain 12/02/2019   Hyperkalemia 12/02/2019   Reactive depression    Wound infection after surgery    Sleep disturbance    Dysphagia, post-stroke    Transaminitis    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   Cerebral abscess    Urinary tract infection without hematuria     Altered mental status    Primary hypercoagulable state (HCC)    Acute pulmonary embolism without acute cor pulmonale (HCC)    Deep vein thrombosis (DVT) of non-extremity vein    Hypokalemia    Acute blood loss anemia    Leukocytosis    Endotracheal tube present    Acute respiratory failure with hypoxemia (HCC)    Stroke (cerebrum) (HCC) 07/19/2019   Pressure injury of skin 07/19/2019   Acute CVA (cerebrovascular accident) (HCC)    Encephalopathy    Dysphagia    Acute encephalopathy    Essential hypertension    Obesity 03/12/2016   Scalp abscess 12/20/2015   Neck abscess 12/20/2015   Pilonidal cyst 02/08/2013    ONSET DATE: stroke in August 2020  REFERRING DIAG: I63.9 (ICD-10-CM) - Cerebrovascular accident (CVA), unspecified mechanism (HCC)   THERAPY DIAG:   Muscle weakness (generalized)  Other lack of coordination  Other abnormalities of gait and mobility  Unsteadiness on feet  Hemiplegia and hemiparesis following cerebral infarction affecting left non-dominant side (HCC)  Spastic hemiplegia of left nondominant side as late effect of cerebral infarction [facility])   Rationale for Evaluation and Treatment: Rehabilitation  SUBJECTIVE:                                                                                                                                                                                             SUBJECTIVE STATEMENT: Pt apologizes being late to session. Reports taking wrong turn then getting held up in the elevator with someone accidentally taking them up a floor instead of down to the gym.    Initial Eval: Patient reports his goal is to walk without using a cane and without assistance. Patient states he wants to walk like he did before having the stroke. Reports his stroke was in August of 2020. States he currently uses a quad cane for ambulation and tries to walk as much as possible. Reports he has a manual wheelchair, but states he tries not to  use it because it depresses him and makes  him feel like he is back to when he first had his stroke. Reports wearing L LE custom AFO at all times.   Reports he is currently doing everything mod-I, but states he could call his mom, dad, or brothers if he needed assistance.  Reports he has routine botox  injections in L UE to help manage tone, but doesn't receive any in his LE.  Reports he has a hospital bed, but sleeps in a regular bed.  Pt accompanied by: self  PERTINENT HISTORY: L hemiparesis s/p R ACA and MCA territory CVA in August 2020, HTN, hx of kidney stones, depression  Per Neurology MD note on 11/24/2023: 68-year-old African-American male with large right hemispheric infarct due to right internal carotid and middle cerebral artery occlusion with cytotoxic edema and brain herniation s/p hemicraniectomy in 06/2019 is doing reasonably well with residual left spastic hemiparesis and left hemianopia.  Etiology of carotid occlusion unclear possibly dissection.  He had a prolonged hospital admission with several complications including DVT, pulmonary embolism, hemorrhagic transformation, abdominal wall hematoma, UTI but made quite remarkable recovery   PAIN:  Are you having pain? No, denies pain today, but states sometimes will have L shoulder pain  PRECAUTIONS: Fall  RED FLAGS: None   WEIGHT BEARING RESTRICTIONS: No  FALLS: Has patient fallen in last 6 months? Yes, stating he fell down his stairs ~5 months ago and is now scared to perform stair navigation  LIVING ENVIRONMENT: Lives with: lives alone Lives in: House/apartment Stairs: Yes: Internal: flight steps; can reach both and External: 4 steps; can reach both, but states he doesn't have to go upstairs Has following equipment at home: Quad cane small base, Hemi walker, Wheelchair (manual), shower chair, Shower bench, and L LE custom AFO Pt has custom K5 wheelchair with rigid, contoured back support and Roho cushion. Pt reports he  calls [employer] to come adjust his seat cushion when he feels it is getting uncomfortable.  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  DIAGNOSTIC FINDINGS:  EXAM: CT ANGIOGRAPHY HEAD AND NECK WITH AND WITHOUT CONTRAST IMPRESSION: 1. No acute intracranial abnormality. 2. Chronically occluded right ICA with minimal opacification of the right MCA and ACA branches. 3. Redemonstrated extensive encephalomalacia in the right MCA and ACA territories secondary to a prior infarct.  Electronically Signed   By: Lyndall Gore M.D.   On: 11/27/2023 13:45  COGNITION: Overall cognitive status: Within functional limits for tasks assessed  VISION (03/29/2024): Therapist performed visual field screen with pt appearing to have L homonomous hemianopsia - pt confirms he has loss of vision on L side.   SENSATION: Light touch: Impaired in L LE Proprioception: Impaired  in L LE  Can feel deep pressure in L LE   COORDINATION: Impaired in L LE due to paresis, hypertonia, and overall decreased flexibility  EDEMA:  Not formally assessed  MUSCLE TONE: LLE: Mild, Moderate, and Hypertonic  MUSCLE LENGTH: Not formally assessed; however, demonstrates L hamstring muscle tightness  DTRs:  Not formally assessed  POSTURE: rounded shoulders, forward head, and weight shift right  LOWER EXTREMITY ROM:       Right Eval Active  ROM Left Eval Passive ROM  Hip flexion WFL Achieves at least 90  Hip extension    Hip abduction    Hip adduction    Hip internal  rotation  Unable to achieve neutral hip rotation, lacking internal rotation  Hip external rotation  [facility] and rests in excessive ER  Knee flexion [facility]  Knee extension WFL Able to achieve terminal knee extension in supine, but with significant discomfort due to hamstring tone  Ankle dorsiflexion WFL Did not remove AFO to assess this date  Ankle plantarflexion WFL Did not remove AFO to assess this date  Ankle inversion    Ankle eversion     (Blank rows = not tested)  LOWER EXTREMITY MMT:    MMT Right Eval Left Eval  Hip flexion 4+ 2-  (compensates with hip adductor activation)  Hip extension    Hip abduction    Hip adduction  3+  Hip internal rotation  2-  Hip external rotation  2  Knee flexion 4+ 1  Knee extension 4+ 3+  Ankle dorsiflexion 4+ Did not remove AFO to assess this date  Ankle plantarflexion 4+ Did not remove AFO to assess this date  Ankle inversion    Ankle eversion    (Blank rows = not tested)  Manual Muscle Test Scale 0/5 = No muscle contraction can be seen or felt 1/5 = Contraction can be felt, but there is no motion 2-/5 = Part moves through incomplete ROM w/ gravity decreased 2/5 = Part moves through complete ROM w/ gravity decreased 2+/5 = Part moves through incomplete ROM (<50%) against gravity or through complete ROM w/ gravity 3-/5 = Part moves through incomplete ROM (>50%) against gravity 3/5 = Part moves through complete ROM against gravity 3+/5 = Part moves through complete ROM against gravity/slight resistance 4-/5= Holds test position against slight to moderate pressure 4/5 = Part moves through complete ROM against gravity/moderate resistance 4+/5= Holds test position against moderate to strong pressure 5/5 = Part moves through complete ROM against gravity/full resistance  BED MOBILITY: (sleeps in regular bed, but has hospital bed available) Findings: Sit to supine CGA Supine to sit Min A and Mod A Rolling to Right Mod A Rolling to Left Min A  and Mod A  TRANSFERS: Sit to stand: CGA and Min A  Assistive device utilized: Counselling psychologist     Stand to sit: CGA and Min A  Assistive device utilized: Tree surgeon to chair: Min A  Assistive device utilized: Counselling psychologist       RAMP:  Not tested  CURB:  Not tested  STAIRS: Not tested *need to assess*  GAIT: Findings: advances L LE with excessive hip external rotation using hip adductors, step to pattern, decreased arm swing- Left, decreased step length- Right, decreased stance time- Left, decreased stride length, decreased hip/knee flexion- Left, decreased ankle dorsiflexion- Left, lateral lean- Right, and decreased trunk rotation Distance walked: ~43ft Assistive device utilized: small based quad cane in R UE  Level of assistance: CGA and Min A  FUNCTIONAL TESTS:  5 times sit to stand: need to assess Timed up and go (TUG): need to assess 6 minute walk test: need to assess 10 meter walk test: 0.52m/s using small based quad cane and CGA Berg Balance Scale: need to assess  PATIENT SURVEYS:  Stroke Impact Scale 03/15/2024: 49/80  TREATMENT DATE: 06/09/2024   Attempts made setting up for Lite Gait. Several efforts to adjust harness but pt reports not being able to adjust and being uncomfortable wanting to defer using it today.   40' ambulation with QC to bathroom. Good foot clearance. Progressing to some step through gait but unable to fully make it reciprocal with LLE past RLE due to decreased stance on RLE.    1x150' QC and CGA. Continuing to work on step through pattern. Continues to have diminished stance time on LLE with RLE step through but is able to progress beyond step to pattern.   65x150' QC and CGA with 6# AW on LLE. Fatigues quickly with 6# AW on ankle outside of lite gait. Decreased swing phase on LLE due to  resistance but maintains consistent step through pattern as described above =.   Nu-Step: L1 for 4 min and RUE support. Not billed.    PATIENT EDUCATION: Education details: Therapy POC, LTGs, HEP, importance of improving L LE flexibility, findings during assessment  Person educated: Patient Education method: Explanation and Handouts Education comprehension: verbalized understanding and needs further education  HOME EXERCISE PROGRAM:  Access Code: [email or web address] URL: [email or web address] Date: 03/10/2024 Prepared by: Connell Kiss  Exercises - Seated Hip Adduction Isometrics with Ball  - 1 x daily - 7 x weekly - 2 sets - 10 reps - 5 seconds hold - Supine Quadricep Sets  - 1 x daily - 7 x weekly - 2 sets - 10 reps - 3 seconds hold   GOALS: Goals reviewed with patient? Yes  SHORT TERM GOALS: Target date: 07/13/2024  Patient will be independent in home exercise program to improve strength/mobility for better functional independence with ADLs.  Baseline: initiated on 03/10/2024 04/14/2024: continued Goal status: IN PROGRESS   LONG TERM GOALS: Target date: 08/24/2024  Patient (< 76 years old) will complete five times sit to stand (5XSTS) test in < 10 seconds indicating an increased LE strength and improved balance.  Baseline: 03/15/2024: 16.53 seconds from standard green chair relying on R hand to push-up from armrest and pt's personal SBQC nearby, but not using it VERSUS 25.85 seconds with R hand across chest, with min A for balance/safety without use of UE support 04/14/2024: 8.96 seconds using R UE support to push-up from armrest and pt's SBQC next to him, 13.75 seconds with R hand across chest with min A for therapist to stabilize chair behind him 05/30/24: 13.01 sec with R UE support on chair; 15.26 sec with R hand across chest and minA from therapist to stabilize chair behind pt Goal status: IN PROGRESS  2.  Patient will increase Berg Balance score to > 45/56 to demonstrate  improved balance and decreased fall risk during functional activities and ADLs.  Baseline: 03/15/2024: 14/56 04/14/2024: 22/56 05/30/24: TBD at next visit Goal status: IN PROGRESS  3.  Patient will increase 10 meter walk test to >1.98m/s using LRAD as to improve gait speed for better community ambulation and to reduce fall risk.  Baseline: 0.30 m/s using small based quad cane with CGA and 1x light min A due to balance instability  04/14/2024: 0.405 m/s using SBQC with close SBA for safety 05/30/24: 0.379 m/s using SBQC with close SBA for safety Goal status: IN PROGRESS  4.  Patient will increase six minute walk test distance to >542ft for progression towards community ambulator and improve gait ability  Baseline: 252ft with [facility]  04/20/2024:  227ft with SBQC and SBA 05/30/24: 360 ft with [facility]  Goal status: IN PROGRESS  5.  Patient will reduce timed up and go to <15 seconds to reduce fall risk and demonstrate improved transfer/gait ability. Baseline: 03/15/2024: 25.32 seconds using SBQC and chair with armrest as well as CGA for safety/steadying 04/14/2024: 22.25 seconds using SBQC & chair w/ armrest and close SBA for safety 05/30/24: 26.87 seconds w/ SBQC and use  Goal status: IN PROGRESS  6.  Patient will ascend/descend 4 stairs, using railing, independently without loss of balance to improve ability to get in/out of home.   Baseline: 03/31/2024: using R UE support on each HR with skilled min assist for balance using primarily step-to pattern 04/20/2024:  using R UE support on each HR with skilled CGA for safety using step-to pattern 05/30/24: continued use of R UE support on HR with skilled CGA for safety using step-to pattern Goal status: IN PROGRESS  ASSESSMENT:  CLINICAL IMPRESSION:    Continuing primary PT POC working on gait mechanics. Pt session greatly limited due to being late to session and also needing bathroom break. Did have trouble with lite gait harness which also took time away from  session. However was able to work on ambulation at [facility] and use of QC. Pt continues to tolerate ambulation with resistance today with step through patterns albeit it is modified and not fully step through due to continued decreased stance on LLE with RLE swing phase. Anticipate with 6#AW  and being outside of lite gait there are some diminished LLE swing phase but has adequate foot clearance during gait bout. Pt will continue to benefit from skilled therapy to address remaining deficits in order to improve overall QoL and return to PLOF.     OBJECTIVE IMPAIRMENTS: Abnormal gait, cardiopulmonary status limiting activity, decreased activity tolerance, decreased balance, decreased coordination, decreased endurance, decreased knowledge of use of DME, decreased mobility, difficulty walking, decreased ROM, decreased strength, hypomobility, impaired flexibility, impaired sensation, impaired tone, impaired UE functional use, improper body mechanics, postural dysfunction, and pain.   ACTIVITY LIMITATIONS: carrying, lifting, bending, standing, squatting, sleeping, stairs, transfers, bed mobility, continence, bathing, toileting, dressing, reach over head, hygiene/grooming, locomotion level, and caring for others  PARTICIPATION LIMITATIONS: meal prep, cleaning, laundry, medication management, shopping, community activity, and yard work  PERSONAL FACTORS: Age, Past/current experiences, Time since onset of injury/illness/exacerbation, Transportation, and 3+ comorbidities: HTN, hx of kidney stones, depression are also affecting patient's functional outcome.   REHAB POTENTIAL: Good  CLINICAL DECISION MAKING: Evolving/moderate complexity  EVALUATION COMPLEXITY: Moderate  PLAN:  PT FREQUENCY: 1-2x/week  PT DURATION: 12 weeks  PLANNED INTERVENTIONS: [id number]- PT Re-evaluation, 97750- Physical Performance Testing, 97110-Therapeutic exercises, 97530- Therapeutic activity, [id number]- Neuromuscular re-education, 97535- Self  Care, [id number]- Manual therapy, [id number]- Gait training, [phone]- Orthotic/Prosthetic subsequent, [phone]- Canalith repositioning, [phone]- Electrical stimulation (manual), Patient/Family education, Balance training, Stair training, Taping, Dry Needling, Joint mobilization, Joint manipulation, Vestibular training, Visual/preceptual remediation/compensation, DME instructions, Cryotherapy, Moist heat, and Biofeedback  PLAN FOR NEXT SESSION:   Progress HEP  - high intensity gait training using +2 HHA vs litegait support vs Lofstrand crutch  - continue adding 7lb AW to L LE when appropriate vs decreasing RUE support/reliance while in litegait  - continue use of litegait harness in upcoming visits to decrease reliance on R UE support - L LE stretching and weightbearing for tone management and to improve alignment during gait/standing  - bridges with R foot placed on unstable surface to promote increased L LE wt bearing - stair navigation for L LE NMR - progress HEP -  BOTOX  appointment 06/24/24   Dorina HERO. Fairly IV, PT, DPT Physical Therapist- [place]  [facility] 7:54 AM 06/09/24

## 2024-06-11 DIAGNOSIS — I639 Cerebral infarction, unspecified: Secondary | ICD-10-CM | POA: Diagnosis not present

## 2024-06-14 ENCOUNTER — Ambulatory Visit: Admitting: Physical Therapy

## 2024-06-14 NOTE — Therapy (Incomplete)
 OUTPATIENT PHYSICAL THERAPY NEURO TREATMENT   Patient Name: Matthew Black MRN: [id number] DOB:06/19/1976, 48 y.o., male Today's Date: 06/14/2024   PCP: Dorina Loving, PA-C REFERRING PROVIDER: Lorilee Sven SQUIBB, MD  END OF SESSION: ***        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   Stroke (HCC)    RICA, R A1, R MCA occlusion 07/19/19   Past Surgical History:  Procedure Laterality Date   CRANIOPLASTY Right 06/05/2020   Procedure: CRANIOPLASTY;  Surgeon: Lanis Pupa, MD;  Location: [facility] OR;  Service: Neurosurgery;  Laterality: Right;  right   CRANIOTOMY Right 07/19/2019   Procedure: RIGHT HEMI-CRANIECTOMY With implantation of skull flap to abdominal wall;  Surgeon: Lanis Pupa, MD;  Location: [facility] OR;  Service: Neurosurgery;  Laterality: Right;   CYST EXCISION N/A 10/08/2016   Procedure: EXCISION OF POSTERIOR NECK CYST;  Surgeon: Mitzie DELENA Freund, MD;  Location: [facility] ORS;  Service: General;  Laterality: N/A;   CYSTOSCOPY/URETEROSCOPY/HOLMIUM LASER/STENT PLACEMENT Right 03/16/2020   Procedure: CYSTOSCOPY RIGHT RETROGRADE PYELOGRAM URETEROSCOPY/HOLMIUM LASER/STENT PLACEMENT;  Surgeon: Carolee Sherwood JONETTA DOUGLAS, MD;  Location: [facility] ORS;  Service: Urology;  Laterality: Right;   INCISION AND DRAINAGE ABSCESS N/A 09/22/2014   Procedure: INCISION AND DRAINAGE ABSCESS POSTERIOR NECK;  Surgeon: Donnice KATHEE Lunger, MD;  Location: [facility] ORS;  Service: General;  Laterality: N/A;   INCISION AND DRAINAGE ABSCESS N/A 12/20/2015   Procedure: INCISION AND DRAINAGE POSTERIOR NECK MASS;  Surgeon:  Krystal Spinner, MD;  Location: [facility] ORS;  Service: General;  Laterality: N/A;   INCISION AND DRAINAGE ABSCESS Left 07/10/2004   middle finger   IR IVC FILTER PLMT / S&I /IMG GUID/MOD SED  07/21/2019   IR RADIOLOGIST EVAL & MGMT  12/14/2019   IR RADIOLOGIST EVAL & MGMT  12/21/2020   IR VENOGRAM RENAL UNI RIGHT  07/21/2019   MASS EXCISION N/A 07/21/2017   Procedure: EXCISION OF BENIGN NECK LESION WITH LAYERED CLOSURE;  Surgeon: Arelia Filippo, MD;  Location: [facility];  Service: Plastics;  Laterality: N/A;   MASS EXCISION N/A 11/10/2017   Procedure: EXCISION BENIGN LESION OF THE NECK WITH LAYERED CLOSURE;  Surgeon: Arelia Filippo, MD;  Location: [facility];  Service: Plastics;  Laterality: N/A;   Patient Active Problem List   Diagnosis Date Noted   Hx of adenomatous colonic polyps 06/01/2023   COVID-19 virus infection 04/21/2021   Left-sided weakness 04/21/2021   S/P craniotomy 06/05/2020   History of cranioplasty 06/05/2020   Peri-rectal abscess 02/14/2020   Abnormal CT scan, pelvis 02/14/2020   Pancytopenia (HCC) 12/02/2019   Nephrolithiasis 12/02/2019   Hydronephrosis with renal and ureteral calculus obstruction 12/02/2019   Rectal pain 12/02/2019   Hyperkalemia 12/02/2019   Reactive depression    Wound infection after surgery    Sleep disturbance    Dysphagia, post-stroke    Transaminitis    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   Cerebral abscess    Urinary tract infection without hematuria    Altered mental status  Primary hypercoagulable state (HCC)    Acute pulmonary embolism without acute cor pulmonale (HCC)    Deep vein thrombosis (DVT) of non-extremity vein    Hypokalemia    Acute blood loss anemia    Leukocytosis    Endotracheal tube present    Acute respiratory failure with hypoxemia (HCC)    Stroke (cerebrum) (HCC) 07/19/2019   Pressure injury of skin 07/19/2019   Acute CVA (cerebrovascular accident) [facility])     Encephalopathy    Dysphagia    Acute encephalopathy    Essential hypertension    Obesity 03/12/2016   Scalp abscess 12/20/2015   Neck abscess 12/20/2015   Pilonidal cyst 02/08/2013    ONSET DATE: stroke in August 2020  REFERRING DIAG: I63.9 (ICD-10-CM) - Cerebrovascular accident (CVA), unspecified mechanism (HCC)   THERAPY DIAG:  *** No diagnosis found.   Rationale for Evaluation and Treatment: Rehabilitation  SUBJECTIVE:                                                                                                                                                                                             SUBJECTIVE STATEMENT:  *** Had OT assessment for driving, who recommends patient have an on-road assessment with a certified Aeronautical engineer. ***  [employer] [phone] for 8 PT vsts from 06/06/2024 - 07/04/2024  (7/22 is 3 of 8)   Pt apologizes being late to session. Reports taking wrong turn then getting held up in the elevator with someone accidentally taking them up a floor instead of down to the gym.    Initial Eval: Patient reports his goal is to walk without using a cane and without assistance. Patient states he wants to walk like he did before having the stroke. Reports his stroke was in August of 2020. States he currently uses a quad cane for ambulation and tries to walk as much as possible. Reports he has a manual wheelchair, but states he tries not to use it because it depresses him and makes him feel like he is back to when he first had his stroke. Reports wearing L LE custom AFO at all times.   Reports he is currently doing everything mod-I, but states he could call his mom, dad, or brothers if he needed assistance.  Reports he has routine botox  injections in L UE to help manage tone, but doesn't receive any in his LE.  Reports he has a hospital bed, but sleeps in a regular bed.  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  Etiology of carotid occlusion unclear possibly dissection.  He had a prolonged hospital admission with several complications including DVT, pulmonary embolism, hemorrhagic transformation, abdominal wall hematoma, UTI but made quite remarkable recovery   PAIN:  Are you having pain? No, denies pain today, but states sometimes will have L shoulder pain  PRECAUTIONS: Fall  RED FLAGS: None   WEIGHT BEARING RESTRICTIONS: No  FALLS: Has patient fallen in last 6 months? Yes, stating he fell down his stairs ~5 months ago and is now scared to perform stair navigation  LIVING ENVIRONMENT: Lives with: lives alone Lives in: House/apartment Stairs: Yes: Internal: flight steps; can reach both and External: 4 steps; can reach both, but states he doesn't have to go upstairs Has following equipment at home: Quad cane small base, Hemi walker, Wheelchair (manual), shower chair, Shower bench, and L LE custom AFO Pt has custom K5 wheelchair with rigid, contoured back support and Roho cushion. Pt reports he calls [employer] to come adjust his seat cushion when he feels it is getting uncomfortable.  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  DIAGNOSTIC FINDINGS:  EXAM: CT ANGIOGRAPHY HEAD AND NECK WITH AND WITHOUT CONTRAST IMPRESSION: 1. No acute intracranial abnormality. 2. Chronically occluded right ICA with minimal opacification of the right MCA and ACA branches. 3. Redemonstrated extensive encephalomalacia in the right MCA and ACA territories secondary to a prior infarct.  Electronically Signed   By: Lyndall Gore M.D.   On: 11/27/2023 13:45  COGNITION: Overall cognitive status: Within functional limits for tasks assessed  VISION (03/29/2024): Therapist performed visual field screen with pt appearing to have L homonomous hemianopsia - pt confirms he has loss of vision on L side.   SENSATION: Light touch: Impaired in L LE Proprioception: Impaired  in L LE  Can feel deep pressure in L LE   COORDINATION: Impaired in L LE due to paresis, hypertonia, and overall decreased flexibility  EDEMA:  Not formally assessed  MUSCLE TONE: LLE: Mild, Moderate, and Hypertonic  MUSCLE LENGTH: Not formally assessed; however, demonstrates L hamstring muscle tightness  DTRs:  Not formally assessed  POSTURE: rounded shoulders, forward head, and weight shift right  LOWER EXTREMITY ROM:       Right Eval Active ROM Left Eval Passive ROM  Hip flexion WFL Achieves at least 90  Hip extension    Hip abduction    Hip adduction    Hip internal rotation  Unable to achieve neutral hip rotation, lacking internal rotation  Hip external rotation  [facility] and rests in excessive ER  Knee flexion [facility]  Knee extension WFL Able to achieve terminal knee extension in supine, but with significant discomfort due to hamstring tone  Ankle dorsiflexion WFL Did not remove AFO to assess this date  Ankle plantarflexion WFL Did not remove AFO to assess this date  Ankle inversion  Ankle eversion     (Blank rows = not tested)  LOWER EXTREMITY MMT:    MMT Right Eval  Left Eval  Hip flexion 4+ 2-  (compensates with hip adductor activation)  Hip extension    Hip abduction    Hip adduction  3+  Hip internal rotation  2-  Hip external rotation  2  Knee flexion 4+ 1  Knee extension 4+ 3+  Ankle dorsiflexion 4+ Did not remove AFO to assess this date  Ankle plantarflexion 4+ Did not remove AFO to assess this date  Ankle inversion    Ankle eversion    (Blank rows = not tested)  Manual Muscle Test Scale 0/5 = No muscle contraction can be seen or felt 1/5 = Contraction can be felt, but there is no motion 2-/5 = Part moves through incomplete ROM w/ gravity decreased 2/5 = Part moves through complete ROM w/ gravity decreased 2+/5 = Part moves through incomplete ROM (<50%) against gravity or through complete ROM w/ gravity 3-/5 = Part moves through incomplete ROM (>50%) against gravity 3/5 = Part moves through complete ROM against gravity 3+/5 = Part moves through complete ROM against gravity/slight resistance 4-/5= Holds test position against slight to moderate pressure 4/5 = Part moves through complete ROM against gravity/moderate resistance 4+/5= Holds test position against moderate to strong pressure 5/5 = Part moves through complete ROM against gravity/full resistance  BED MOBILITY: (sleeps in regular bed, but has hospital bed available) Findings: Sit to supine CGA Supine to sit Min A and Mod A Rolling to Right Mod A Rolling to Left Min A and Mod A  TRANSFERS: Sit to stand: CGA and Min A  Assistive device utilized: Counselling psychologist     Stand to sit: CGA and Min A  Assistive device utilized: Tree surgeon to chair: Min A  Assistive device utilized: Counselling psychologist       RAMP:  Not tested  CURB:  Not tested  STAIRS: Not tested *need to assess*  GAIT: Findings: advances L LE with excessive hip external rotation using hip adductors, step to pattern, decreased arm swing- Left, decreased step length- Right,  decreased stance time- Left, decreased stride length, decreased hip/knee flexion- Left, decreased ankle dorsiflexion- Left, lateral lean- Right, and decreased trunk rotation Distance walked: ~58ft Assistive device utilized: small based quad cane in R UE  Level of assistance: CGA and Min A  FUNCTIONAL TESTS:  5 times sit to stand: need to assess Timed up and go (TUG): need to assess 6 minute walk test: need to assess 10 meter walk test: 0.25m/s using small based quad cane and CGA Berg Balance Scale: need to assess  PATIENT SURVEYS:  Stroke Impact Scale 03/15/2024: 49/80  TREATMENT DATE: 06/14/2024  ***  Attempts made setting up for Lite Gait. Several efforts to adjust harness but pt reports not being able to adjust and being uncomfortable wanting to defer using it today.   40' ambulation with QC to bathroom. Good foot clearance. Progressing to some step through gait but unable to fully make it reciprocal with LLE past RLE due to decreased stance on RLE.    1x150' QC and CGA. Continuing to work on step through pattern. Continues to have diminished stance time on LLE with RLE step through but is able to progress beyond step to pattern.   7x150' QC and CGA with 6# AW on LLE. Fatigues quickly with 6# AW on ankle outside of lite gait. Decreased swing phase on LLE due to resistance but maintains consistent step through pattern as described above =.   Nu-Step: L1 for 4 min and RUE support. Not billed.   ***   PATIENT EDUCATION: Education details: Therapy POC, LTGs, HEP, importance of improving L LE flexibility, findings during assessment  Person educated: Patient Education method: Explanation and Handouts Education comprehension: verbalized understanding and needs further education  HOME EXERCISE PROGRAM:  Access Code: [email or web address] URL:  [email or web address] Date: 03/10/2024 Prepared by: Connell Kiss  Exercises - Seated Hip Adduction Isometrics with Ball  - 1 x daily - 7 x weekly - 2 sets - 10 reps - 5 seconds hold - Supine Quadricep Sets  - 1 x daily - 7 x weekly - 2 sets - 10 reps - 3 seconds hold   GOALS: Goals reviewed with patient? Yes  SHORT TERM GOALS: Target date: 07/13/2024  Patient will be independent in home exercise program to improve strength/mobility for better functional independence with ADLs.  Baseline: initiated on 03/10/2024 04/14/2024: continued Goal status: IN PROGRESS   LONG TERM GOALS: Target date: 08/24/2024  Patient (< 2 years old) will complete five times sit to stand (5XSTS) test in < 10 seconds indicating an increased LE strength and improved balance.  Baseline: 03/15/2024: 16.53 seconds from standard green chair relying on R hand to push-up from armrest and pt's personal SBQC nearby, but not using it VERSUS 25.85 seconds with R hand across chest, with min A for balance/safety without use of UE support 04/14/2024: 8.96 seconds using R UE support to push-up from armrest and pt's SBQC next to him, 13.75 seconds with R hand across chest with min A for therapist to stabilize chair behind him 05/30/24: 13.01 sec with R UE support on chair; 15.26 sec with R hand across chest and minA from therapist to stabilize chair behind pt Goal status: IN PROGRESS  2.  Patient will increase Berg Balance score to > 45/56 to demonstrate improved balance and decreased fall risk during functional activities and ADLs.  Baseline: 03/15/2024: 14/56 04/14/2024: 22/56 05/30/24: TBD at next visit Goal status: IN PROGRESS  3.  Patient will increase 10 meter walk test to >1.57m/s using LRAD as to improve gait speed for better community ambulation and to reduce fall risk.  Baseline: 0.30 m/s using small based quad cane with CGA and 1x light min A due to balance instability  04/14/2024: 0.405 m/s using SBQC with  close SBA for safety 05/30/24: 0.379 m/s using SBQC with close SBA for safety Goal status: IN PROGRESS  4.  Patient will increase six minute walk test distance to >542ft for progression towards community ambulator and improve gait ability  Baseline: 240ft with [facility]  04/20/2024:  237ft with SBQC and SBA 05/30/24: 360  ft with SBQC Goal status: IN PROGRESS  5.  Patient will reduce timed up and go to <15 seconds to reduce fall risk and demonstrate improved transfer/gait ability. Baseline: 03/15/2024: 25.32 seconds using SBQC and chair with armrest as well as CGA for safety/steadying 04/14/2024: 22.25 seconds using SBQC & chair w/ armrest and close SBA for safety 05/30/24: 26.87 seconds w/ SBQC and use  Goal status: IN PROGRESS  6.  Patient will ascend/descend 4 stairs, using railing, independently without loss of balance to improve ability to get in/out of home.   Baseline: 03/31/2024: using R UE support on each HR with skilled min assist for balance using primarily step-to pattern 04/20/2024:  using R UE support on each HR with skilled CGA for safety using step-to pattern 05/30/24: continued use of R UE support on HR with skilled CGA for safety using step-to pattern Goal status: IN PROGRESS  ASSESSMENT:  CLINICAL IMPRESSION:   *** Continuing primary PT POC working on gait mechanics. Pt session greatly limited due to being late to session and also needing bathroom break. Did have trouble with lite gait harness which also took time away from session. However was able to work on ambulation at [facility] and use of QC. Pt continues to tolerate ambulation with resistance today with step through patterns albeit it is modified and not fully step through due to continued decreased stance on LLE with RLE swing phase. Anticipate with 6#AW  and being outside of lite gait there are some diminished LLE swing phase but has adequate foot clearance during gait bout. Pt will continue to benefit from skilled therapy to address  remaining deficits in order to improve overall QoL and return to PLOF.     OBJECTIVE IMPAIRMENTS: Abnormal gait, cardiopulmonary status limiting activity, decreased activity tolerance, decreased balance, decreased coordination, decreased endurance, decreased knowledge of use of DME, decreased mobility, difficulty walking, decreased ROM, decreased strength, hypomobility, impaired flexibility, impaired sensation, impaired tone, impaired UE functional use, improper body mechanics, postural dysfunction, and pain.   ACTIVITY LIMITATIONS: carrying, lifting, bending, standing, squatting, sleeping, stairs, transfers, bed mobility, continence, bathing, toileting, dressing, reach over head, hygiene/grooming, locomotion level, and caring for others  PARTICIPATION LIMITATIONS: meal prep, cleaning, laundry, medication management, shopping, community activity, and yard work  PERSONAL FACTORS: Age, Past/current experiences, Time since onset of injury/illness/exacerbation, Transportation, and 3+ comorbidities: HTN, hx of kidney stones, depression are also affecting patient's functional outcome.   REHAB POTENTIAL: Good  CLINICAL DECISION MAKING: Evolving/moderate complexity  EVALUATION COMPLEXITY: Moderate  PLAN:  PT FREQUENCY: 1-2x/week  PT DURATION: 12 weeks  PLANNED INTERVENTIONS: [id number]- PT Re-evaluation, 97750- Physical Performance Testing, 97110-Therapeutic exercises, 97530- Therapeutic activity, [id number]- Neuromuscular re-education, 97535- Self Care, [id number]- Manual therapy, [id number]- Gait training, [phone]- Orthotic/Prosthetic subsequent, [phone]- Canalith repositioning, [phone]- Electrical stimulation (manual), Patient/Family education, Balance training, Stair training, Taping, Dry Needling, Joint mobilization, Joint manipulation, Vestibular training, Visual/preceptual remediation/compensation, DME instructions, Cryotherapy, Moist heat, and Biofeedback  PLAN FOR NEXT SESSION:  *** Progress HEP  - high  intensity gait training using +2 HHA vs litegait support vs Lofstrand crutch  - continue adding 7lb AW to L LE when appropriate vs decreasing RUE support/reliance while in litegait  - continue use of litegait harness in upcoming visits to decrease reliance on R UE support - L LE stretching and weightbearing for tone management and to improve alignment during gait/standing  - bridges with R foot placed on unstable surface to promote increased L LE wt bearing - stair navigation for L LE NMR -  progress HEP - BOTOX  appointment 06/24/24   Connell Kiss, PT, DPT, NCS, CSRS Physical Therapist - [facility]  12:52 PM 06/14/24

## 2024-06-16 ENCOUNTER — Ambulatory Visit: Admitting: Physical Therapy

## 2024-06-19 ENCOUNTER — Other Ambulatory Visit: Payer: Self-pay | Admitting: Medical

## 2024-06-21 ENCOUNTER — Ambulatory Visit: Admitting: Physical Therapy

## 2024-06-21 DIAGNOSIS — R278 Other lack of coordination: Secondary | ICD-10-CM

## 2024-06-21 DIAGNOSIS — I69354 Hemiplegia and hemiparesis following cerebral infarction affecting left non-dominant side: Secondary | ICD-10-CM

## 2024-06-21 DIAGNOSIS — R2681 Unsteadiness on feet: Secondary | ICD-10-CM | POA: Diagnosis not present

## 2024-06-21 DIAGNOSIS — R2689 Other abnormalities of gait and mobility: Secondary | ICD-10-CM | POA: Diagnosis not present

## 2024-06-21 DIAGNOSIS — M6281 Muscle weakness (generalized): Secondary | ICD-10-CM | POA: Diagnosis not present

## 2024-06-21 DIAGNOSIS — R29818 Other symptoms and signs involving the nervous system: Secondary | ICD-10-CM

## 2024-06-21 NOTE — Therapy (Signed)
 OUTPATIENT PHYSICAL THERAPY NEURO TREATMENT   Patient Name: Matthew Black MRN: [id number] DOB:09-30-1976, 48 y.o., male Today's Date: 06/21/2024   PCP: Dorina Loving, PA-C REFERRING PROVIDER: Lorilee Sven SQUIBB, MD  END OF SESSION: ***  PT End of Session - 06/21/24 1407     Visit Number 24    Number of Visits 48    Date for PT Re-Evaluation 08/24/24    Authorization Type [employer] [id number] for 8 PT vsts from 06/06/2024 - 07/04/2024  (7/29 is 3 of 8)    PT Start Time 1407    PT Stop Time 1445    PT Time Calculation (min) 38 min    Equipment Utilized During Treatment Gait belt;Other (comment)   L LE AFO   Activity Tolerance Patient tolerated treatment well    Behavior During Therapy [facility] for tasks assessed/performed               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   Stroke (HCC)    RICA, R A1, R MCA occlusion 07/19/19   Past Surgical History:  Procedure Laterality Date   CRANIOPLASTY Right 06/05/2020   Procedure: CRANIOPLASTY;  Surgeon: Lanis Pupa, MD;  Location: [facility] OR;  Service: Neurosurgery;  Laterality: Right;  right   CRANIOTOMY Right 07/19/2019   Procedure: RIGHT HEMI-CRANIECTOMY With implantation of skull flap to abdominal wall;  Surgeon: Lanis Pupa, MD;  Location: [facility] OR;  Service: Neurosurgery;  Laterality: Right;   CYST EXCISION N/A 10/08/2016   Procedure: EXCISION OF POSTERIOR NECK CYST;  Surgeon: Mitzie DELENA Freund, MD;  Location: [facility] ORS;  Service: General;  Laterality: N/A;   CYSTOSCOPY/URETEROSCOPY/HOLMIUM LASER/STENT PLACEMENT Right  03/16/2020   Procedure: CYSTOSCOPY RIGHT RETROGRADE PYELOGRAM URETEROSCOPY/HOLMIUM LASER/STENT PLACEMENT;  Surgeon: Carolee Sherwood JONETTA DOUGLAS, MD;  Location: [facility] ORS;  Service: Urology;  Laterality: Right;   INCISION AND DRAINAGE ABSCESS N/A 09/22/2014   Procedure: INCISION AND DRAINAGE ABSCESS POSTERIOR NECK;  Surgeon: Donnice KATHEE Lunger, MD;  Location: [facility];  Service: General;  Laterality: N/A;   INCISION AND DRAINAGE ABSCESS N/A 12/20/2015   Procedure: INCISION AND DRAINAGE POSTERIOR NECK MASS;  Surgeon: Krystal Spinner, MD;  Location: [facility]S;  Service: General;  Laterality: N/A;   INCISION AND DRAINAGE ABSCESS Left 07/10/2004   middle finger   IR IVC FILTER PLMT / S&I /IMG GUID/MOD SED  07/21/2019   IR RADIOLOGIST EVAL & MGMT  12/14/2019   IR RADIOLOGIST EVAL & MGMT  12/21/2020   IR VENOGRAM RENAL UNI RIGHT  07/21/2019   MASS EXCISION N/A 07/21/2017   Procedure: EXCISION OF BENIGN NECK LESION WITH LAYERED CLOSURE;  Surgeon: Arelia Filippo, MD;  Location: [facility];  Service: Plastics;  Laterality: N/A;   MASS EXCISION N/A 11/10/2017   Procedure: EXCISION BENIGN LESION OF THE NECK WITH LAYERED CLOSURE;  Surgeon: Arelia Filippo, MD;  Location: [facility];  Service: Plastics;  Laterality: N/A;   Patient Active Problem List   Diagnosis Date Noted   Hx of adenomatous colonic polyps 06/01/2023  COVID-19 virus infection 04/21/2021   Left-sided weakness 04/21/2021   S/P craniotomy 06/05/2020   History of cranioplasty 06/05/2020   Peri-rectal abscess 02/14/2020   Abnormal CT scan, pelvis 02/14/2020   Pancytopenia (HCC) 12/02/2019   Nephrolithiasis 12/02/2019   Hydronephrosis with renal and ureteral calculus obstruction 12/02/2019   Rectal pain 12/02/2019   Hyperkalemia 12/02/2019   Reactive depression    Wound infection after surgery    Sleep disturbance    Dysphagia, post-stroke    Transaminitis    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   Cerebral  abscess    Urinary tract infection without hematuria    Altered mental status    Primary hypercoagulable state (HCC)    Acute pulmonary embolism without acute cor pulmonale (HCC)    Deep vein thrombosis (DVT) of non-extremity vein    Hypokalemia    Acute blood loss anemia    Leukocytosis    Endotracheal tube present    Acute respiratory failure with hypoxemia (HCC)    Stroke (cerebrum) (HCC) 07/19/2019   Pressure injury of skin 07/19/2019   Acute CVA (cerebrovascular accident) (HCC)    Encephalopathy    Dysphagia    Acute encephalopathy    Essential hypertension    Obesity 03/12/2016   Scalp abscess 12/20/2015   Neck abscess 12/20/2015   Pilonidal cyst 02/08/2013    ONSET DATE: stroke in August 2020  REFERRING DIAG: I63.9 (ICD-10-CM) - Cerebrovascular accident (CVA), unspecified mechanism (HCC)   THERAPY DIAG:   Muscle weakness (generalized)  Other lack of coordination  Other abnormalities of gait and mobility  Unsteadiness on feet  Hemiplegia and hemiparesis following cerebral infarction affecting left non-dominant side (HCC)  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)  Other symptoms and signs involving the nervous system   Rationale for Evaluation and Treatment: Rehabilitation  SUBJECTIVE:                                                                                                                                                                                             SUBJECTIVE STATEMENT:  ***Pt states he received the [employer] Foot device and he has been using it and can see the difference. Pt states it is a workout. Pt reports he is using it at least 1x/day for 1 hour. Pt states his mother complimented him on his improved walking mechanics and stamina. Pt states he is now able to walk further so he doesn't have to find as close of a parking spot.   Pt states he did his driving on-road assessment and it went  well.  Denies pain. Denies stumbles/falls.    ***email [employer] about     Initial Eval: Patient reports his goal is to walk without using a cane and without assistance. Patient states he wants to walk like he did before having the stroke. Reports his stroke was in August of 2020. States he currently uses a quad cane for ambulation and tries to walk as much as possible. Reports he has a manual wheelchair, but states he tries not to use it because it depresses him and makes him feel like he is back to when he first had his stroke. Reports wearing L LE custom AFO at all times.   Reports he is currently doing everything mod-I, but states he could call his mom, dad, or brothers if he needed assistance.  Reports he has routine botox  injections in L UE to help manage tone, but doesn't receive any in his LE.  Reports he has a hospital bed, but sleeps in a regular bed.  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  Etiology of carotid occlusion unclear possibly dissection.  He had a prolonged hospital admission with several complications including DVT, pulmonary embolism, hemorrhagic transformation, abdominal wall hematoma, UTI but made quite remarkable recovery   PAIN:  Are you having pain? No, denies pain today, but states sometimes will have L shoulder pain  PRECAUTIONS: Fall  RED FLAGS: None   WEIGHT BEARING RESTRICTIONS: No  FALLS: Has patient fallen in last 6 months? Yes, stating he fell down his stairs ~5 months ago and is now scared to perform stair  navigation  LIVING ENVIRONMENT: Lives with: lives alone Lives in: House/apartment Stairs: Yes: Internal: flight steps; can reach both and External: 4 steps; can reach both, but states he doesn't have to go upstairs Has following equipment at home: Quad cane small base, Hemi walker, Wheelchair (manual), shower chair, Shower bench, and L LE custom AFO Pt has custom K5 wheelchair with rigid, contoured back support and Roho cushion. Pt reports he calls [employer] to come adjust his seat cushion when he feels it is getting uncomfortable.  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  DIAGNOSTIC FINDINGS:  EXAM: CT ANGIOGRAPHY HEAD AND NECK WITH AND WITHOUT CONTRAST IMPRESSION: 1. No acute intracranial abnormality. 2. Chronically occluded right ICA with minimal opacification of the right MCA and ACA branches. 3. Redemonstrated extensive encephalomalacia in the right MCA and ACA territories secondary to a prior infarct.  Electronically Signed   By: Lyndall Gore M.D.   On: 11/27/2023 13:45  COGNITION: Overall cognitive status: Within functional limits for tasks assessed  VISION (03/29/2024): Therapist performed visual field screen with pt appearing to have L homonomous hemianopsia - pt confirms he has loss of vision on L side.   SENSATION: Light  touch: Impaired in L LE Proprioception: Impaired  in L LE  Can feel deep pressure in L LE   COORDINATION: Impaired in L LE due to paresis, hypertonia, and overall decreased flexibility  EDEMA:  Not  formally assessed  MUSCLE TONE: LLE: Mild, Moderate, and Hypertonic  MUSCLE LENGTH: Not formally assessed; however, demonstrates L hamstring muscle tightness  DTRs:  Not formally assessed  POSTURE: rounded shoulders, forward head, and weight shift right  LOWER EXTREMITY ROM:       Right Eval Active ROM Left Eval Passive ROM  Hip flexion WFL Achieves at least 90  Hip extension    Hip abduction    Hip adduction    Hip internal rotation  Unable to achieve neutral hip rotation, lacking internal rotation  Hip external rotation  [facility] and rests in excessive ER  Knee flexion [facility]  Knee extension WFL Able to achieve terminal knee extension in supine, but with significant discomfort due to hamstring tone  Ankle dorsiflexion WFL Did not remove AFO to assess this date  Ankle plantarflexion WFL Did not remove AFO to assess this date  Ankle inversion    Ankle eversion     (Blank rows = not tested)  LOWER EXTREMITY MMT:    MMT Right Eval Left Eval  Hip flexion 4+ 2-  (compensates with hip adductor activation)  Hip extension    Hip abduction    Hip adduction  3+  Hip internal rotation  2-  Hip external rotation  2  Knee flexion 4+ 1  Knee extension 4+ 3+  Ankle dorsiflexion 4+ Did not remove AFO to assess this date  Ankle plantarflexion 4+ Did not remove AFO to assess this date  Ankle inversion    Ankle eversion    (Blank rows = not tested)  Manual Muscle Test Scale 0/5 = No muscle contraction can be seen or felt 1/5 = Contraction can be felt, but there is no motion 2-/5 = Part moves through incomplete ROM w/ gravity decreased 2/5 = Part moves through complete ROM w/ gravity decreased 2+/5 = Part moves through incomplete ROM (<50%) against gravity or through complete ROM w/ gravity 3-/5 = Part moves through incomplete ROM (>50%) against gravity 3/5 = Part moves through complete ROM against gravity 3+/5 = Part moves through complete ROM against gravity/slight  resistance 4-/5= Holds test position against slight to moderate pressure 4/5 = Part moves through complete ROM against gravity/moderate resistance 4+/5= Holds test position against moderate to strong pressure 5/5 = Part moves through complete ROM against gravity/full resistance  BED MOBILITY: (sleeps in regular bed, but has hospital bed available) Findings: Sit to supine CGA Supine to sit Min A and Mod A Rolling to Right Mod A Rolling to Left Min A and Mod A  TRANSFERS: Sit to stand: CGA and Min A  Assistive device utilized: Counselling psychologist     Stand to sit: CGA and Min A  Assistive device utilized: Tree surgeon to chair: Min A  Assistive device utilized: Counselling psychologist       RAMP:  Not tested  CURB:  Not tested  STAIRS: Not tested *need to assess*  GAIT: Findings: advances L LE with excessive hip external rotation using hip adductors, step to pattern, decreased arm swing- Left, decreased step length- Right, decreased stance time- Left, decreased stride length, decreased hip/knee flexion- Left, decreased ankle dorsiflexion- Left, lateral lean- Right, and decreased trunk rotation Distance  walked: ~63ft Assistive device utilized: small based quad cane in R UE  Level of assistance: CGA and Min A  FUNCTIONAL TESTS:  5 times sit to stand: need to assess Timed up and go (TUG): need to assess 6 minute walk test: need to assess 10 meter walk test: 0.27m/s using small based quad cane and CGA Berg Balance Scale: need to assess  PATIENT SURVEYS:  Stroke Impact Scale 03/15/2024: 49/80                                                                                                                               TREATMENT DATE: 06/21/2024  ***  Gait training ~173ft into therapy clinic using SBQC with SBA - pt demos increased step lengths bilaterally with reciprocal pattern and improving gait speed along with longer L stance phase; however, continues to have  L LE mild to moderately externally rotated.   Pt agreeable to continue gait training with use of litegait harness for increased dynamic challenge in safe set-up.    Donned litegait harness in standing with supervision using R UE support on litegait handle.   Gait training 2 laps (~359ft) +***1 lap (~113ft) (seated break between) in litegait harness WITHOUT BWS and therapist assisting with managing the litegait. Pt demonstrating the following gait deviations with therapist providing the described cuing and facilitation for improvement:  When using B UE support pt demos significantly improved reciprocal stepping pattern with L LE only min externally rotated and increased gait speed *** R UE support on litegait handle  Progressed to no UE support for ~40ft of continuous gait during each lap! Without UE support pt does demo shorter L stance phase with smaller step lengths bilaterally (especially on R); however, is able to maintain slight reciprocal stepping pattern Improving R LE step length with increased repetition and pt increasing confidence ambulating without UE support, until he becomes fatigued and then has some tone in L LE causing shaking movement Instead of no UE support attempted just 1 finger support on R side and pt does significantly better at keeping smooth reciprocal gait pattern   Pt reporting need to use restroom. Doffed litegait harness.   Gait training ~16ft to bathroom using [facility] with therapist providing facilitation and cuing for increased L glute activation with hip extension to advance pelvis over L stance limb - cuing for upright posture with trunk extension and bringing gaze upright.    Donned 6lb AW on L LE and performed additional ~153ft using SBQC with CGA/lightmin A - ***  ***   After seated rest break, donned 6lb AW to L LE for 2nd gait trial, using R UE support throughout  Provided increased facilitation to improve L hip alignment throughout (to avoid pelvis  rotation towards L with L hip posterior to R) and pt able to ambulate with L foot completely straight ahead! (Not excessively ER!) Therapist facilitating increased L wt shift and longer L stance phase time Performed ~53ft  of backwards gait at end of 2nd gait trial with pt performing primarily step-to pattern leading with R LE and having poor control of backwards momentum Will benefit from attempting this again in future visit     Gait training additional ~220ft up to hospital entrance (including ambulating on/off elevator) with focus of carrying over improved gait mechanics in real-life community level setting. Therapist providing min manual facilitation with verbal cuing for increased pelvis rotation towards R hip to decrease L LE excessively externally rotated with pt demonstrating improvement in hip/knee flexion! Pt able to complete reciprocal stepping pattern 100% of the time, but requires cuing for increased symmetry as still reverts back to L step being longer than R.    *** ***   PATIENT EDUCATION: Education details: Therapy POC, LTGs, HEP, importance of improving L LE flexibility, findings during assessment  Person educated: Patient Education method: Explanation and Handouts Education comprehension: verbalized understanding and needs further education  HOME EXERCISE PROGRAM:  Access Code: [email or web address] URL: [email or web address] Date: 03/10/2024 Prepared by: Connell Kiss  Exercises - Seated Hip Adduction Isometrics with Ball  - 1 x daily - 7 x weekly - 2 sets - 10 reps - 5 seconds hold - Supine Quadricep Sets  - 1 x daily - 7 x weekly - 2 sets - 10 reps - 3 seconds hold   GOALS: Goals reviewed with patient? Yes  SHORT TERM GOALS: Target date: 07/13/2024  Patient will be independent in home exercise program to improve strength/mobility for better functional independence with ADLs.  Baseline: initiated on 03/10/2024 04/14/2024: continued Goal status: IN  PROGRESS   LONG TERM GOALS: Target date: 08/24/2024  Patient (< 46 years old) will complete five times sit to stand (5XSTS) test in < 10 seconds indicating an increased LE strength and improved balance.  Baseline: 03/15/2024: 16.53 seconds from standard green chair relying on R hand to push-up from armrest and pt's personal SBQC nearby, but not using it VERSUS 25.85 seconds with R hand across chest, with min A for balance/safety without use of UE support 04/14/2024: 8.96 seconds using R UE support to push-up from armrest and pt's SBQC next to him, 13.75 seconds with R hand across chest with min A for therapist to stabilize chair behind him 05/30/24: 13.01 sec with R UE support on chair; 15.26 sec with R hand across chest and minA from therapist to stabilize chair behind pt Goal status: IN PROGRESS  2.  Patient will increase Berg Balance score to > 45/56 to demonstrate improved balance and decreased fall risk during functional activities and ADLs.  Baseline: 03/15/2024: 14/56 04/14/2024: 22/56 05/30/24: TBD at next visit Goal status: IN PROGRESS  3.  Patient will increase 10 meter walk test to >1.53m/s using LRAD as to improve gait speed for better community ambulation and to reduce fall risk.  Baseline: 0.30 m/s using small based quad cane with CGA and 1x light min A due to balance instability  04/14/2024: 0.405 m/s using SBQC with close SBA for safety 05/30/24: 0.379 m/s using SBQC with close SBA for safety Goal status: IN PROGRESS  4.  Patient will increase six minute walk test distance to >560ft for progression towards community ambulator and improve gait ability  Baseline: 25ft with [facility]  04/20/2024:  263ft with SBQC and SBA 05/30/24: 360 ft with SBQC Goal status: IN PROGRESS  5.  Patient will reduce timed up and go to <15 seconds to reduce fall risk and demonstrate improved transfer/gait ability. Baseline: 03/15/2024: 25.32 seconds using  SBQC and chair with armrest as well as CGA for  safety/steadying 04/14/2024: 22.25 seconds using SBQC & chair w/ armrest and close SBA for safety 05/30/24: 26.87 seconds w/ SBQC and use  Goal status: IN PROGRESS  6.  Patient will ascend/descend 4 stairs, using railing, independently without loss of balance to improve ability to get in/out of home.   Baseline: 03/31/2024: using R UE support on each HR with skilled min assist for balance using primarily step-to pattern 04/20/2024:  using R UE support on each HR with skilled CGA for safety using step-to pattern 05/30/24: continued use of R UE support on HR with skilled CGA for safety using step-to pattern Goal status: IN PROGRESS  ASSESSMENT:  CLINICAL IMPRESSION:   *** Continuing primary PT POC working on gait mechanics. Pt session greatly limited due to being late to session and also needing bathroom break. Did have trouble with lite gait harness which also took time away from session. However was able to work on ambulation at [facility] and use of QC. Pt continues to tolerate ambulation with resistance today with step through patterns albeit it is modified and not fully step through due to continued decreased stance on LLE with RLE swing phase. Anticipate with 6#AW  and being outside of lite gait there are some diminished LLE swing phase but has adequate foot clearance during gait bout. Pt will continue to benefit from skilled therapy to address remaining deficits in order to improve overall QoL and return to PLOF.     OBJECTIVE IMPAIRMENTS: Abnormal gait, cardiopulmonary status limiting activity, decreased activity tolerance, decreased balance, decreased coordination, decreased endurance, decreased knowledge of use of DME, decreased mobility, difficulty walking, decreased ROM, decreased strength, hypomobility, impaired flexibility, impaired sensation, impaired tone, impaired UE functional use, improper body mechanics, postural dysfunction, and pain.   ACTIVITY LIMITATIONS: carrying, lifting, bending,  standing, squatting, sleeping, stairs, transfers, bed mobility, continence, bathing, toileting, dressing, reach over head, hygiene/grooming, locomotion level, and caring for others  PARTICIPATION LIMITATIONS: meal prep, cleaning, laundry, medication management, shopping, community activity, and yard work  PERSONAL FACTORS: Age, Past/current experiences, Time since onset of injury/illness/exacerbation, Transportation, and 3+ comorbidities: HTN, hx of kidney stones, depression are also affecting patient's functional outcome.   REHAB POTENTIAL: Good  CLINICAL DECISION MAKING: Evolving/moderate complexity  EVALUATION COMPLEXITY: Moderate  PLAN:  PT FREQUENCY: 1-2x/week  PT DURATION: 12 weeks  PLANNED INTERVENTIONS: [id number]- PT Re-evaluation, 97750- Physical Performance Testing, 97110-Therapeutic exercises, 97530- Therapeutic activity, [id number]- Neuromuscular re-education, 97535- Self Care, [id number]- Manual therapy, [id number]- Gait training, [phone]- Orthotic/Prosthetic subsequent, [phone]- Canalith repositioning, [phone]- Electrical stimulation (manual), Patient/Family education, Balance training, Stair training, Taping, Dry Needling, Joint mobilization, Joint manipulation, Vestibular training, Visual/preceptual remediation/compensation, DME instructions, Cryotherapy, Moist heat, and Biofeedback  PLAN FOR NEXT SESSION:  *** Progress HEP  - high intensity gait training using +2 HHA vs litegait support vs Lofstrand crutch  - continue adding 7lb AW to L LE when appropriate vs decreasing RUE support/reliance while in litegait  - continue use of litegait harness in upcoming visits to decrease reliance on R UE support - L LE stretching and weightbearing for tone management and to improve alignment during gait/standing  - bridges with R foot placed on unstable surface to promote increased L LE wt bearing - stair navigation for L LE NMR - progress HEP - BOTOX  appointment 06/24/24   Connell Kiss, PT, DPT, NCS,  CSRS Physical Therapist - [facility]  2:47 PM 06/21/24

## 2024-06-23 ENCOUNTER — Ambulatory Visit: Admitting: Physical Therapy

## 2024-06-23 DIAGNOSIS — R29818 Other symptoms and signs involving the nervous system: Secondary | ICD-10-CM | POA: Diagnosis not present

## 2024-06-23 DIAGNOSIS — I69354 Hemiplegia and hemiparesis following cerebral infarction affecting left non-dominant side: Secondary | ICD-10-CM

## 2024-06-23 DIAGNOSIS — R2689 Other abnormalities of gait and mobility: Secondary | ICD-10-CM

## 2024-06-23 DIAGNOSIS — M6281 Muscle weakness (generalized): Secondary | ICD-10-CM

## 2024-06-23 DIAGNOSIS — R278 Other lack of coordination: Secondary | ICD-10-CM | POA: Diagnosis not present

## 2024-06-23 DIAGNOSIS — R2681 Unsteadiness on feet: Secondary | ICD-10-CM

## 2024-06-23 NOTE — Therapy (Signed)
 OUTPATIENT PHYSICAL THERAPY NEURO TREATMENT   Patient Name: Matthew Black MRN: [id number] DOB:1976/10/20, 48 y.o., male Today's Date: 06/23/2024   PCP: Dorina Loving, PA-C REFERRING PROVIDER: Lorilee Sven SQUIBB, MD  END OF SESSION:   PT End of Session - 06/23/24 1319     Visit Number 25    Number of Visits 48    Date for PT Re-Evaluation 08/24/24    Authorization Type [employer] [id number] for 8 PT vsts from 06/06/2024 - 07/04/2024  (7/31 is 4 of 8)    PT Start Time 1319    PT Stop Time 1400    PT Time Calculation (min) 41 min    Equipment Utilized During Treatment Gait belt;Other (comment)   L LE AFO   Activity Tolerance Patient tolerated treatment well    Behavior During Therapy [facility] for tasks assessed/performed                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   Stroke (HCC)    RICA, R A1, R MCA occlusion 07/19/19   Past Surgical History:  Procedure Laterality Date   CRANIOPLASTY Right 06/05/2020   Procedure: CRANIOPLASTY;  Surgeon: Lanis Pupa, MD;  Location: [facility] OR;  Service: Neurosurgery;  Laterality: Right;  right   CRANIOTOMY Right 07/19/2019   Procedure: RIGHT HEMI-CRANIECTOMY With implantation of skull flap to abdominal wall;  Surgeon: Lanis Pupa, MD;  Location: [facility] OR;  Service: Neurosurgery;  Laterality: Right;   CYST EXCISION N/A 10/08/2016   Procedure: EXCISION OF POSTERIOR NECK CYST;  Surgeon: Mitzie DELENA Freund, MD;  Location: [facility] ORS;  Service: General;  Laterality: N/A;   CYSTOSCOPY/URETEROSCOPY/HOLMIUM LASER/STENT PLACEMENT Right  03/16/2020   Procedure: CYSTOSCOPY RIGHT RETROGRADE PYELOGRAM URETEROSCOPY/HOLMIUM LASER/STENT PLACEMENT;  Surgeon: Carolee Sherwood JONETTA DOUGLAS, MD;  Location: [facility] ORS;  Service: Urology;  Laterality: Right;   INCISION AND DRAINAGE ABSCESS N/A 09/22/2014   Procedure: INCISION AND DRAINAGE ABSCESS POSTERIOR NECK;  Surgeon: Donnice KATHEE Lunger, MD;  Location: [facility] ORS;  Service: General;  Laterality: N/A;   INCISION AND DRAINAGE ABSCESS N/A 12/20/2015   Procedure: INCISION AND DRAINAGE POSTERIOR NECK MASS;  Surgeon: Krystal Spinner, MD;  Location: [facility] ORS;  Service: General;  Laterality: N/A;   INCISION AND DRAINAGE ABSCESS Left 07/10/2004   middle finger   IR IVC FILTER PLMT / S&I /IMG GUID/MOD SED  07/21/2019   IR RADIOLOGIST EVAL & MGMT  12/14/2019   IR RADIOLOGIST EVAL & MGMT  12/21/2020   IR VENOGRAM RENAL UNI RIGHT  07/21/2019   MASS EXCISION N/A 07/21/2017   Procedure: EXCISION OF BENIGN NECK LESION WITH LAYERED CLOSURE;  Surgeon: Arelia Filippo, MD;  Location: [facility];  Service: Plastics;  Laterality: N/A;   MASS EXCISION N/A 11/10/2017   Procedure: EXCISION BENIGN LESION OF THE NECK WITH LAYERED CLOSURE;  Surgeon: Arelia Filippo, MD;  Location: [facility];  Service: Plastics;  Laterality: N/A;   Patient Active Problem List   Diagnosis Date Noted   Hx of adenomatous colonic polyps 06/01/2023  COVID-19 virus infection 04/21/2021   Left-sided weakness 04/21/2021   S/P craniotomy 06/05/2020   History of cranioplasty 06/05/2020   Peri-rectal abscess 02/14/2020   Abnormal CT scan, pelvis 02/14/2020   Pancytopenia (HCC) 12/02/2019   Nephrolithiasis 12/02/2019   Hydronephrosis with renal and ureteral calculus obstruction 12/02/2019   Rectal pain 12/02/2019   Hyperkalemia 12/02/2019   Reactive depression    Wound infection after surgery    Sleep disturbance    Dysphagia, post-stroke    Transaminitis    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   Cerebral  abscess    Urinary tract infection without hematuria    Altered mental status    Primary hypercoagulable state (HCC)    Acute pulmonary embolism without acute cor pulmonale (HCC)    Deep vein thrombosis (DVT) of non-extremity vein    Hypokalemia    Acute blood loss anemia    Leukocytosis    Endotracheal tube present    Acute respiratory failure with hypoxemia (HCC)    Stroke (cerebrum) (HCC) 07/19/2019   Pressure injury of skin 07/19/2019   Acute CVA (cerebrovascular accident) (HCC)    Encephalopathy    Dysphagia    Acute encephalopathy    Essential hypertension    Obesity 03/12/2016   Scalp abscess 12/20/2015   Neck abscess 12/20/2015   Pilonidal cyst 02/08/2013    ONSET DATE: stroke in August 2020  REFERRING DIAG: I63.9 (ICD-10-CM) - Cerebrovascular accident (CVA), unspecified mechanism (HCC)   THERAPY DIAG:   Muscle weakness (generalized)  Other lack of coordination  Other abnormalities of gait and mobility  Unsteadiness on feet  Hemiplegia and hemiparesis following cerebral infarction affecting left non-dominant side (HCC)  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)  Other symptoms and signs involving the nervous system   Rationale for Evaluation and Treatment: Rehabilitation  SUBJECTIVE:                                                                                                                                                                                             SUBJECTIVE STATEMENT:  Pt states he enjoys the exercise bike because it makes him feel like he did before the stroke. Denies pain to start. Denies stumbles/falls. Pt states he gets the botox  tomorrow. Pt continues to report enjoying use of Motus Nova Foot device and feeling it is improving his ankle movement.   Initial Eval: Patient reports his goal is to walk without using a cane and without assistance. Patient states he wants to walk like he did  before having the stroke. Reports his stroke was in  August of 2020. States he currently uses a quad cane for ambulation and tries to walk as much as possible. Reports he has a manual wheelchair, but states he tries not to use it because it depresses him and makes him feel like he is back to when he first had his stroke. Reports wearing L LE custom AFO at all times.   Reports he is currently doing everything mod-I, but states he could call his mom, dad, or brothers if he needed assistance.  Reports he has routine botox  injections in L UE to help manage tone, but doesn't receive any in his LE.  Reports he has a hospital bed, but sleeps in a regular bed.  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  Etiology of carotid occlusion unclear possibly dissection.  He had a prolonged hospital admission with several complications including DVT, pulmonary embolism, hemorrhagic transformation, abdominal wall hematoma, UTI but made quite remarkable recovery   PAIN:  Are you having pain? No, denies pain today, but states sometimes will have L shoulder pain  PRECAUTIONS: Fall  RED FLAGS: None   WEIGHT BEARING RESTRICTIONS: No  FALLS: Has patient fallen in last 6 months? Yes, stating he fell down his stairs ~5 months ago and is now scared to perform stair navigation  LIVING ENVIRONMENT: Lives with: lives alone Lives in: House/apartment Stairs: Yes: Internal: flight steps; can reach both and External: 4 steps; can reach both, but states he doesn't have to go upstairs Has following  equipment at home: Quad cane small base, Hemi walker, Wheelchair (manual), shower chair, Shower bench, and L LE custom AFO Pt has custom K5 wheelchair with rigid, contoured back support and Roho cushion. Pt reports he calls [employer] to come adjust his seat cushion when he feels it is getting uncomfortable.  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  DIAGNOSTIC FINDINGS:  EXAM: CT ANGIOGRAPHY HEAD AND NECK WITH AND WITHOUT CONTRAST IMPRESSION: 1. No acute intracranial abnormality. 2. Chronically occluded right ICA with minimal opacification of the right MCA and ACA branches. 3. Redemonstrated extensive encephalomalacia in the right MCA and ACA territories secondary to a prior infarct.  Electronically Signed   By: Lyndall Gore M.D.   On: 11/27/2023 13:45  COGNITION: Overall cognitive status: Within functional limits for tasks assessed  VISION (03/29/2024): Therapist performed visual field screen with pt appearing to have L homonomous hemianopsia - pt confirms he has loss of vision on L side.   SENSATION: Light touch: Impaired in L LE Proprioception: Impaired  in L LE  Can feel deep pressure in L LE   COORDINATION: Impaired in L LE due to paresis, hypertonia, and overall decreased flexibility  EDEMA:  Not formally assessed  MUSCLE TONE: LLE: Mild, Moderate, and Hypertonic  MUSCLE LENGTH: Not formally assessed; however, demonstrates L hamstring muscle tightness  DTRs:  Not formally assessed  POSTURE: rounded shoulders, forward  head, and weight shift right  LOWER EXTREMITY ROM:       Right Eval Active ROM Left Eval Passive ROM  Hip flexion WFL Achieves at least 90  Hip extension    Hip abduction    Hip adduction    Hip internal rotation  Unable to achieve neutral hip rotation, lacking internal rotation  Hip external rotation  [facility] and rests in excessive ER  Knee flexion [facility]  Knee extension WFL Able to achieve terminal knee extension in supine, but with significant discomfort due to hamstring tone  Ankle dorsiflexion WFL Did not remove AFO to assess this date  Ankle plantarflexion WFL Did not remove AFO to assess this date  Ankle inversion    Ankle eversion     (Blank rows = not tested)  LOWER EXTREMITY MMT:    MMT Right Eval Left Eval  Hip flexion 4+ 2-  (compensates with hip adductor activation)  Hip extension    Hip abduction    Hip adduction  3+  Hip internal rotation  2-  Hip external rotation  2  Knee flexion 4+ 1  Knee extension 4+ 3+  Ankle dorsiflexion 4+ Did not remove AFO to assess this date  Ankle plantarflexion 4+ Did not remove AFO to assess this date  Ankle inversion    Ankle eversion    (Blank rows = not tested)  Manual Muscle Test Scale 0/5 = No muscle contraction can be seen or felt 1/5 = Contraction can be felt, but there is no motion 2-/5 = Part moves through incomplete ROM w/ gravity decreased 2/5 = Part moves through complete ROM w/ gravity decreased 2+/5 = Part moves through incomplete ROM (<50%) against gravity or through complete ROM w/ gravity 3-/5 = Part moves through incomplete ROM (>50%) against gravity 3/5 = Part moves through complete ROM against gravity 3+/5 = Part moves through complete ROM against gravity/slight resistance 4-/5= Holds test position against slight to moderate pressure 4/5 = Part moves through complete ROM against gravity/moderate resistance 4+/5= Holds test position against moderate to strong pressure 5/5 = Part moves through complete  ROM against gravity/full resistance  BED MOBILITY: (sleeps in regular bed, but has hospital bed available) Findings: Sit to supine CGA Supine to sit Min A and Mod A Rolling to Right Mod A Rolling to Left Min A and Mod A  TRANSFERS: Sit to stand: CGA and Min A  Assistive device utilized: Counselling psychologist     Stand to sit: CGA and Min A  Assistive device utilized: Tree surgeon to chair: Min A  Assistive device utilized: Counselling psychologist       RAMP:  Not tested  CURB:  Not tested  STAIRS: Not tested *need to assess*  GAIT: Findings: advances L LE with excessive hip external rotation using hip adductors, step to pattern, decreased arm swing- Left, decreased step length- Right, decreased stance time- Left, decreased stride length, decreased hip/knee flexion- Left, decreased ankle dorsiflexion- Left, lateral lean- Right, and decreased trunk rotation Distance walked: ~47ft Assistive device utilized: small based quad cane in R UE  Level of assistance: CGA and Min A  FUNCTIONAL TESTS:  5 times sit to stand: need to assess Timed up and go (TUG): need to assess 6 minute walk test: need to assess 10 meter  walk test: 0.42m/s using small based quad cane and CGA Berg Balance Scale: need to assess  PATIENT SURVEYS:  Stroke Impact Scale 03/15/2024: 49/80                                                                                                                               TREATMENT DATE: 06/23/2024  Gait training ~195ft into therapy clinic using SBQC with SBA - pt demos increased step lengths bilaterally with reciprocal pattern and improving gait speed along with longer L stance phase; however, continues to have L LE mild to moderately externally rotated.    B LE reciprocal movement pattern on Nustep for functional strengthening and motor planning against the following levels:  Level 1 progressed to level 3 resistance for 3 minutes using B LEs Level 1  resistance for 1 minute using only L LE and therapist controlling the machine to promote L hip/knee extension strength Level 1 for 1 minute with B LEs again Therapist facilitating improved L LE alignment otherwise pt with excessive L knee abduction Totaling 5 minutes and 159 steps Goal steps per minute (SPM): no specific goal set     Donned 7lb AW on L LE  Gait training ~134ft using SBQC with skilled light min assist for balance, but primarily for facilitating improved L stance phase with increased L hip extension and forward progression of pelvis over L stance to allow longer R step length for consistent reciprocal stepping pattern. Cuing throughout to look up as pt with tendency for downward gaze at floor.  L LE NMR at stairs with R UE support on handrail via: Stepping on 1st step with R LE and then tapping 2nd step with L LE 2x 8 reps (seated break between) Wearing 7lb AW on L LE Therapist providing manual facilitation for L LE hip/knee flexion when lifting/lowering; however, pt demos improved ability to perform this movement after repetition with only moderate circumduction compensation *Pt reports this as very challenging   PATIENT EDUCATION: Education details: Therapy POC, LTGs, HEP, importance of improving L LE flexibility, findings during assessment  Person educated: Patient Education method: Explanation and Handouts Education comprehension: verbalized understanding and needs further education  HOME EXERCISE PROGRAM:  Access Code: [email or web address] URL: [email or web address] Date: 03/10/2024 Prepared by: Connell Kiss  Exercises - Seated Hip Adduction Isometrics with Ball  - 1 x daily - 7 x weekly - 2 sets - 10 reps - 5 seconds hold - Supine Quadricep Sets  - 1 x daily - 7 x weekly - 2 sets - 10 reps - 3 seconds hold Use of Motus Nova Foot device for at least 1hour every day  GOALS: Goals reviewed with patient? Yes  SHORT TERM GOALS: Target date:  07/13/2024  Patient will be independent in home exercise program to improve strength/mobility for better functional independence with ADLs.  Baseline: initiated on 03/10/2024 04/14/2024: continued Goal status: IN PROGRESS   LONG TERM GOALS: Target date: 08/24/2024  Patient (<  25 years old) will complete five times sit to stand (5XSTS) test in < 10 seconds indicating an increased LE strength and improved balance.  Baseline: 03/15/2024: 16.53 seconds from standard green chair relying on R hand to push-up from armrest and pt's personal SBQC nearby, but not using it VERSUS 25.85 seconds with R hand across chest, with min A for balance/safety without use of UE support 04/14/2024: 8.96 seconds using R UE support to push-up from armrest and pt's SBQC next to him, 13.75 seconds with R hand across chest with min A for therapist to stabilize chair behind him 05/30/24: 13.01 sec with R UE support on chair; 15.26 sec with R hand across chest and minA from therapist to stabilize chair behind pt Goal status: IN PROGRESS  2.  Patient will increase Berg Balance score to > 45/56 to demonstrate improved balance and decreased fall risk during functional activities and ADLs.  Baseline: 03/15/2024: 14/56 04/14/2024: 22/56 05/30/24: TBD at next visit Goal status: IN PROGRESS  3.  Patient will increase 10 meter walk test to >1.30m/s using LRAD as to improve gait speed for better community ambulation and to reduce fall risk.  Baseline: 0.30 m/s using small based quad cane with CGA and 1x light min A due to balance instability  04/14/2024: 0.405 m/s using SBQC with close SBA for safety 05/30/24: 0.379 m/s using SBQC with close SBA for safety Goal status: IN PROGRESS  4.  Patient will increase six minute walk test distance to >572ft for progression towards community ambulator and improve gait ability  Baseline: 263ft with [facility]  04/20/2024:  222ft with SBQC and SBA 05/30/24: 360 ft with SBQC Goal status: IN PROGRESS  5.   Patient will reduce timed up and go to <15 seconds to reduce fall risk and demonstrate improved transfer/gait ability. Baseline: 03/15/2024: 25.32 seconds using SBQC and chair with armrest as well as CGA for safety/steadying 04/14/2024: 22.25 seconds using SBQC & chair w/ armrest and close SBA for safety 05/30/24: 26.87 seconds w/ SBQC and use  Goal status: IN PROGRESS  6.  Patient will ascend/descend 4 stairs, using railing, independently without loss of balance to improve ability to get in/out of home.   Baseline: 03/31/2024: using R UE support on each HR with skilled min assist for balance using primarily step-to pattern 04/20/2024:  using R UE support on each HR with skilled CGA for safety using step-to pattern 05/30/24: continued use of R UE support on HR with skilled CGA for safety using step-to pattern Goal status: IN PROGRESS  ASSESSMENT:  CLINICAL IMPRESSION:    Therapy session focused on L LE NMR via use of Nustep, gait training with AW, and reciprocal stepping on stairs. Patient reports he really enjoys use of Nustep because it makes him feel like he did before the stroke - patient demonstrates ability to consistently perform reciprocal movement pattern when using only B LEs; however, L LE remains excessively abducted/externally rotated requiring skilled assistance for proper alignment. Patient participated in 179ft gait training using SBQC with 7lb AW on L LE and progressed to reciprocal foot tapping on steps with pt demonstrating improving recruitment/motor plan for L hip/knee flexion with decreased circumduction compensation. Pt will continue to benefit from skilled therapy to address remaining deficits in order to improve overall QoL and return to PLOF.     OBJECTIVE IMPAIRMENTS: Abnormal gait, cardiopulmonary status limiting activity, decreased activity tolerance, decreased balance, decreased coordination, decreased endurance, decreased knowledge of use of DME, decreased mobility, difficulty  walking,  decreased ROM, decreased strength, hypomobility, impaired flexibility, impaired sensation, impaired tone, impaired UE functional use, improper body mechanics, postural dysfunction, and pain.   ACTIVITY LIMITATIONS: carrying, lifting, bending, standing, squatting, sleeping, stairs, transfers, bed mobility, continence, bathing, toileting, dressing, reach over head, hygiene/grooming, locomotion level, and caring for others  PARTICIPATION LIMITATIONS: meal prep, cleaning, laundry, medication management, shopping, community activity, and yard work  PERSONAL FACTORS: Age, Past/current experiences, Time since onset of injury/illness/exacerbation, Transportation, and 3+ comorbidities: HTN, hx of kidney stones, depression are also affecting patient's functional outcome.   REHAB POTENTIAL: Good  CLINICAL DECISION MAKING: Evolving/moderate complexity  EVALUATION COMPLEXITY: Moderate  PLAN:  PT FREQUENCY: 1-2x/week  PT DURATION: 12 weeks  PLANNED INTERVENTIONS: [id number]- PT Re-evaluation, 97750- Physical Performance Testing, 97110-Therapeutic exercises, 97530- Therapeutic activity, [id number]- Neuromuscular re-education, 97535- Self Care, [id number]- Manual therapy, [id number]- Gait training, [phone]- Orthotic/Prosthetic subsequent, [phone]- Canalith repositioning, [phone]- Electrical stimulation (manual), Patient/Family education, Balance training, Stair training, Taping, Dry Needling, Joint mobilization, Joint manipulation, Vestibular training, Visual/preceptual remediation/compensation, DME instructions, Cryotherapy, Moist heat, and Biofeedback  PLAN FOR NEXT SESSION:   Progress HEP  high intensity gait training using +2 HHA vs litegait support vs SBQC continue adding 7lb AW to L LE when appropriate vs decreasing RUE support/reliance while in litegait Backwards gait training Stepping over obstacles Continue stair training for L LE NMR For L stance & swing phase L LE stretching and weightbearing for tone  management and to improve alignment during gait/standing (avoid excessive hip ER) Non gait training ideas: bridges with R foot placed on unstable surface to promote increased L LE wt bearing - BOTOX  appointment 06/24/24   Connell Kiss, PT, DPT, NCS, CSRS Physical Therapist - [facility]  7:05 PM 06/23/24

## 2024-06-24 ENCOUNTER — Encounter: Attending: Physical Medicine and Rehabilitation | Admitting: Physical Medicine and Rehabilitation

## 2024-06-24 ENCOUNTER — Encounter: Payer: Self-pay | Admitting: Physical Medicine and Rehabilitation

## 2024-06-24 VITALS — BP 122/80 | HR 74 | Ht 71.0 in | Wt 285.0 lb

## 2024-06-24 DIAGNOSIS — R252 Cramp and spasm: Secondary | ICD-10-CM | POA: Diagnosis not present

## 2024-06-24 DIAGNOSIS — G8114 Spastic hemiplegia affecting left nondominant side: Secondary | ICD-10-CM

## 2024-06-24 MED ORDER — ONABOTULINUMTOXINA 100 UNITS IJ SOLR
200.0000 [IU] | Freq: Once | INTRAMUSCULAR | Status: AC
Start: 2024-06-24 — End: 2024-06-24
  Administered 2024-06-24: 200 [IU] via INTRAMUSCULAR

## 2024-06-24 MED ORDER — SODIUM CHLORIDE (PF) 0.9 % IJ SOLN
2.0000 mL | Freq: Once | INTRAMUSCULAR | Status: AC
Start: 2024-06-24 — End: 2024-06-24
  Administered 2024-06-24: 2 mL

## 2024-06-26 NOTE — Progress Notes (Signed)
 Botox  for muscle spasticity  Indication: Severe spasticity which interferes with ADL,mobility and/or  hygiene and is unresponsive to medication management and other conservative care Informed consent was obtained after describing risks and benefits of the procedure with the patient. This includes bleeding, bruising, infection, excessive weakness, or medication side effects. A REMS form is on file and signed.  Left upper extremity biceps 100U  Left lower extremity biceps femoris 100U  All injections were done after obtaining appropriate US  visualization and after negative drawback for blood. The patient tolerated the procedure well. Post procedure instructions were given. A followup appointment was made  Patient noted temporary weakness in his leg post procedure. Injection site examined and there is no evidence of swelling/bleeding/abnormalities. Patient able to walk to car with his AD and felt weakness resolved. Advised to please call us  should he feels it returns.

## 2024-06-28 ENCOUNTER — Ambulatory Visit: Attending: Physical Medicine and Rehabilitation | Admitting: Physical Therapy

## 2024-06-28 DIAGNOSIS — R29818 Other symptoms and signs involving the nervous system: Secondary | ICD-10-CM | POA: Insufficient documentation

## 2024-06-28 DIAGNOSIS — R2689 Other abnormalities of gait and mobility: Secondary | ICD-10-CM | POA: Diagnosis not present

## 2024-06-28 DIAGNOSIS — R2681 Unsteadiness on feet: Secondary | ICD-10-CM | POA: Diagnosis not present

## 2024-06-28 DIAGNOSIS — R278 Other lack of coordination: Secondary | ICD-10-CM | POA: Insufficient documentation

## 2024-06-28 DIAGNOSIS — R252 Cramp and spasm: Secondary | ICD-10-CM | POA: Diagnosis not present

## 2024-06-28 DIAGNOSIS — M6281 Muscle weakness (generalized): Secondary | ICD-10-CM | POA: Diagnosis not present

## 2024-06-28 DIAGNOSIS — I69354 Hemiplegia and hemiparesis following cerebral infarction affecting left non-dominant side: Secondary | ICD-10-CM | POA: Diagnosis not present

## 2024-06-28 NOTE — Therapy (Signed)
 OUTPATIENT PHYSICAL THERAPY NEURO TREATMENT   Patient Name: Matthew Black MRN: [id number] DOB:1976/09/25, 48 y.o., male Today's Date: 06/28/2024   PCP: Dorina Loving, PA-C REFERRING PROVIDER: Lorilee Sven SQUIBB, MD  END OF SESSION:   PT End of Session - 06/28/24 1401     Visit Number 26    Number of Visits 48    Date for PT Re-Evaluation 08/24/24    Authorization Type [employer] [id number] for 8 PT vsts from 06/06/2024 - 07/04/2024  (8/5 is 5 of 8)    PT Start Time 1402    PT Stop Time 1445    PT Time Calculation (min) 43 min    Equipment Utilized During Treatment Gait belt;Other (comment)   L LE AFO   Activity Tolerance Patient tolerated treatment well    Behavior During Therapy [facility] for tasks assessed/performed          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   Stroke (HCC)    RICA, R A1, R MCA occlusion 07/19/19   Past Surgical History:  Procedure Laterality Date   CRANIOPLASTY Right 06/05/2020   Procedure: CRANIOPLASTY;  Surgeon: Lanis Pupa, MD;  Location: [facility] OR;  Service: Neurosurgery;  Laterality: Right;  right   CRANIOTOMY Right 07/19/2019   Procedure: RIGHT HEMI-CRANIECTOMY With implantation of skull flap to abdominal wall;  Surgeon: Lanis Pupa, MD;  Location: [facility] OR;  Service: Neurosurgery;  Laterality: Right;   CYST EXCISION N/A 10/08/2016   Procedure: EXCISION OF POSTERIOR NECK CYST;  Surgeon: Mitzie DELENA Freund, MD;  Location: [facility] ORS;  Service: General;  Laterality: N/A;   CYSTOSCOPY/URETEROSCOPY/HOLMIUM LASER/STENT PLACEMENT Right 03/16/2020    Procedure: CYSTOSCOPY RIGHT RETROGRADE PYELOGRAM URETEROSCOPY/HOLMIUM LASER/STENT PLACEMENT;  Surgeon: Carolee Sherwood JONETTA DOUGLAS, MD;  Location: [facility] ORS;  Service: Urology;  Laterality: Right;   INCISION AND DRAINAGE ABSCESS N/A 09/22/2014   Procedure: INCISION AND DRAINAGE ABSCESS POSTERIOR NECK;  Surgeon: Donnice KATHEE Lunger, MD;  Location: [facility] ORS;  Service: General;  Laterality: N/A;   INCISION AND DRAINAGE ABSCESS N/A 12/20/2015   Procedure: INCISION AND DRAINAGE POSTERIOR NECK MASS;  Surgeon: Krystal Spinner, MD;  Location: [facility]S;  Service: General;  Laterality: N/A;   INCISION AND DRAINAGE ABSCESS Left 07/10/2004   middle finger   IR IVC FILTER PLMT / S&I /IMG GUID/MOD SED  07/21/2019   IR RADIOLOGIST EVAL & MGMT  12/14/2019   IR RADIOLOGIST EVAL & MGMT  12/21/2020   IR VENOGRAM RENAL UNI RIGHT  07/21/2019   MASS EXCISION N/A 07/21/2017   Procedure: EXCISION OF BENIGN NECK LESION WITH LAYERED CLOSURE;  Surgeon: Arelia Filippo, MD;  Location: [facility];  Service: Plastics;  Laterality: N/A;   MASS EXCISION N/A 11/10/2017   Procedure: EXCISION BENIGN LESION OF THE NECK WITH LAYERED CLOSURE;  Surgeon: Arelia Filippo, MD;  Location: [facility];  Service: Plastics;  Laterality: N/A;   Patient Active Problem List   Diagnosis Date Noted   Hx of adenomatous colonic polyps 06/01/2023   COVID-19 virus infection 04/21/2021  Left-sided weakness 04/21/2021   S/P craniotomy 06/05/2020   History of cranioplasty 06/05/2020   Peri-rectal abscess 02/14/2020   Abnormal CT scan, pelvis 02/14/2020   Pancytopenia (HCC) 12/02/2019   Nephrolithiasis 12/02/2019   Hydronephrosis with renal and ureteral calculus obstruction 12/02/2019   Rectal pain 12/02/2019   Hyperkalemia 12/02/2019   Reactive depression    Wound infection after surgery    Sleep disturbance    Dysphagia, post-stroke    Transaminitis    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   Cerebral abscess     Urinary tract infection without hematuria    Altered mental status    Primary hypercoagulable state (HCC)    Acute pulmonary embolism without acute cor pulmonale (HCC)    Deep vein thrombosis (DVT) of non-extremity vein    Hypokalemia    Acute blood loss anemia    Leukocytosis    Endotracheal tube present    Acute respiratory failure with hypoxemia (HCC)    Stroke (cerebrum) (HCC) 07/19/2019   Pressure injury of skin 07/19/2019   Acute CVA (cerebrovascular accident) (HCC)    Encephalopathy    Dysphagia    Acute encephalopathy    Essential hypertension    Obesity 03/12/2016   Scalp abscess 12/20/2015   Neck abscess 12/20/2015   Pilonidal cyst 02/08/2013    ONSET DATE: stroke in August 2020  REFERRING DIAG: I63.9 (ICD-10-CM) - Cerebrovascular accident (CVA), unspecified mechanism (HCC)   THERAPY DIAG:   Muscle weakness (generalized)  Other lack of coordination  Other abnormalities of gait and mobility  Unsteadiness on feet  Hemiplegia and hemiparesis following cerebral infarction affecting left non-dominant side (HCC)  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)  Other symptoms and signs involving the nervous system   Rationale for Evaluation and Treatment: Rehabilitation  SUBJECTIVE:                                                                                                                                                                                             SUBJECTIVE STATEMENT:  Pt reports he is doing OK. Reports when he first got the botox  in his L LE it was kind of scary because he is used to the tone being there and since he didn't have it any more, it made him feel less comfortable walking. Pt states now (4 days after botox ) he feels his leg is looser with it not tight like it used to be and pt states he likes it.   Pt states when he is using the [employer] it feels like it also helps loosen  up  his ankle. No reports of pain nor stumbles/falls.   Initial Eval: Patient reports his goal is to walk without using a cane and without assistance. Patient states he wants to walk like he did before having the stroke. Reports his stroke was in August of 2020. States he currently uses a quad cane for ambulation and tries to walk as much as possible. Reports he has a manual wheelchair, but states he tries not to use it because it depresses him and makes him feel like he is back to when he first had his stroke. Reports wearing L LE custom AFO at all times.   Reports he is currently doing everything mod-I, but states he could call his mom, dad, or brothers if he needed assistance.  Reports he has routine botox  injections in L UE to help manage tone, but doesn't receive any in his LE.  Reports he has a hospital bed, but sleeps in a regular bed.  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  Etiology of carotid occlusion unclear possibly dissection.  He had a prolonged hospital admission with several complications including DVT, pulmonary embolism, hemorrhagic transformation, abdominal wall hematoma, UTI but made quite remarkable recovery   PAIN:  Are you having pain? No, denies pain today, but states sometimes will have L shoulder pain  PRECAUTIONS: Fall  RED FLAGS: None   WEIGHT BEARING RESTRICTIONS: No  FALLS: Has patient fallen in last 6 months? Yes, stating he fell down his stairs ~5 months ago and is now scared to perform stair navigation  LIVING ENVIRONMENT: Lives  with: lives alone Lives in: House/apartment Stairs: Yes: Internal: flight steps; can reach both and External: 4 steps; can reach both, but states he doesn't have to go upstairs Has following equipment at home: Quad cane small base, Hemi walker, Wheelchair (manual), shower chair, Shower bench, and L LE custom AFO Pt has custom K5 wheelchair with rigid, contoured back support and Roho cushion. Pt reports he calls [employer] to come adjust his seat cushion when he feels it is getting uncomfortable.  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  DIAGNOSTIC FINDINGS:  EXAM: CT ANGIOGRAPHY HEAD AND NECK WITH AND WITHOUT CONTRAST IMPRESSION: 1. No acute intracranial abnormality. 2. Chronically occluded right ICA with minimal opacification of the right MCA and ACA branches. 3. Redemonstrated extensive encephalomalacia in the right MCA and ACA territories secondary to a prior infarct.  Electronically Signed   By: Lyndall Gore M.D.   On: 11/27/2023 13:45  COGNITION: Overall cognitive status: Within functional limits for tasks assessed  VISION (03/29/2024): Therapist performed visual field screen with pt appearing to have L homonomous hemianopsia - pt confirms he has loss of vision on L side.   SENSATION: Light touch: Impaired in  L LE Proprioception: Impaired  in L LE  Can feel deep pressure in L LE   COORDINATION: Impaired in L LE due to paresis, hypertonia, and overall decreased flexibility  EDEMA:  Not formally assessed  MUSCLE TONE: LLE:  Mild, Moderate, and Hypertonic  MUSCLE LENGTH: Not formally assessed; however, demonstrates L hamstring muscle tightness  DTRs:  Not formally assessed  POSTURE: rounded shoulders, forward head, and weight shift right  LOWER EXTREMITY ROM:       Right Eval Active ROM Left Eval Passive ROM  Hip flexion WFL Achieves at least 90  Hip extension    Hip abduction    Hip adduction    Hip internal rotation  Unable to achieve neutral hip rotation, lacking internal rotation  Hip external rotation  [facility] and rests in excessive ER  Knee flexion [facility]  Knee extension WFL Able to achieve terminal knee extension in supine, but with significant discomfort due to hamstring tone  Ankle dorsiflexion WFL Did not remove AFO to assess this date  Ankle plantarflexion WFL Did not remove AFO to assess this date  Ankle inversion    Ankle eversion     (Blank rows = not tested)  LOWER EXTREMITY MMT:    MMT Right Eval Left Eval  Hip flexion 4+ 2-  (compensates with hip adductor activation)  Hip extension    Hip abduction    Hip adduction  3+  Hip internal rotation  2-  Hip external rotation  2  Knee flexion 4+ 1  Knee extension 4+ 3+  Ankle dorsiflexion 4+ Did not remove AFO to assess this date  Ankle plantarflexion 4+ Did not remove AFO to assess this date  Ankle inversion    Ankle eversion    (Blank rows = not tested)  Manual Muscle Test Scale 0/5 = No muscle contraction can be seen or felt 1/5 = Contraction can be felt, but there is no motion 2-/5 = Part moves through incomplete ROM w/ gravity decreased 2/5 = Part moves through complete ROM w/ gravity decreased 2+/5 = Part moves through incomplete ROM (<50%) against gravity or through complete ROM w/ gravity 3-/5 = Part moves through incomplete ROM (>50%) against gravity 3/5 = Part moves through complete ROM against gravity 3+/5 = Part moves through complete ROM against gravity/slight resistance 4-/5= Holds test position against  slight to moderate pressure 4/5 = Part moves through complete ROM against gravity/moderate resistance 4+/5= Holds test position against moderate to strong pressure 5/5 = Part moves through complete ROM against gravity/full resistance  BED MOBILITY: (sleeps in regular bed, but has hospital bed available) Findings: Sit to supine CGA Supine to sit Min A and Mod A Rolling to Right Mod A Rolling to Left Min A and Mod A  TRANSFERS: Sit to stand: CGA and Min A  Assistive device utilized: Counselling psychologist     Stand to sit: CGA and Min A  Assistive device utilized: Tree surgeon to chair: Min A  Assistive device utilized: Counselling psychologist       RAMP:  Not tested  CURB:  Not tested  STAIRS: Not tested *need to assess*  GAIT: Findings: advances L LE with excessive hip external rotation using hip adductors, step to pattern, decreased arm swing- Left, decreased step length- Right, decreased stance time- Left, decreased stride length, decreased hip/knee flexion- Left, decreased ankle dorsiflexion- Left, lateral lean- Right, and decreased trunk rotation Distance walked: ~65ft Assistive  device utilized: small based quad cane in R UE  Level of assistance: CGA and Min A  FUNCTIONAL TESTS:  5 times sit to stand: need to assess Timed up and go (TUG): need to assess 6 minute walk test: need to assess 10 meter walk test: 0.73m/s using small based quad cane and CGA Berg Balance Scale: need to assess  PATIENT SURVEYS:  Stroke Impact Scale 03/15/2024: 49/80                                                                                                                               TREATMENT DATE: 06/28/2024   Gait training ~152ft into therapy clinic using SBQC with SBA - pt demos increased step lengths bilaterally with reciprocal pattern and improving gait speed along with longer L stance phase; however, continues to have L LE mild to moderately externally rotated.  Pt  agreeable to continue gait training with use of litegait harness for increased dynamic challenge in safe set-up.    Donned litegait harness in standing with supervision using R UE support on litegait handle.  Gait training 2 laps (~321ft) in litegait harness WITHOUT BWS and therapist assisting with managing the litegait. Pt demonstrating the following gait deviations with therapist providing the described cuing and facilitation for improvement:  When using R UE support, pt demos significantly improved reciprocal stepping pattern symmetry with L LE only min externally rotated and increased gait speed  Progressed to no UE support for ~83ft of continuous gait during each lap! Without UE support, pt does demo shorter L stance phase with smaller step lengths bilaterally (especially on R); however, is able to maintain min reciprocal stepping pattern Improving R LE step length with increased repetition and pt increasing confidence ambulating without UE support, with no significant shaking movement during stance today; however, movements are definitely less coordinated and pt having 3x catching L toes during swing advancement L LE toes catching more during swing advancement today, especially when pt becomes fatigued HR 75bpm Forward/backwards gait ~44ft x2 During forward gait instead of no UE support, performed just 1 finger support on R side and pt does significantly better at keeping smooth reciprocal gait pattern with improved upright posture similarly to when using full R UE support During backwards gait: therapist facilitating improved L LE hip/knee flexion to lift and advance L LE behind him for reciprocal stepping pattern Pt reporting some pain in his L calf and across the AFO strap at his ankle stating it is a movement his muscles haven't done and he isn't used to it Therapist decreased the manual facilitation and provided more verbal cuing to increase L step length and have longer L stance time  during backwards gait - pt reporting less discomfort with this Pt reports fear of backwards gait because he cannot see behind him HR 80bpm   PATIENT EDUCATION: Education details: Therapy POC, LTGs, HEP, importance of improving L LE flexibility, findings during assessment  Person educated:  Patient Education method: Explanation and Handouts Education comprehension: verbalized understanding and needs further education  HOME EXERCISE PROGRAM:  Access Code: [email or web address] URL: [email or web address] Date: 03/10/2024 Prepared by: Connell Kiss  Exercises - Seated Hip Adduction Isometrics with Ball  - 1 x daily - 7 x weekly - 2 sets - 10 reps - 5 seconds hold - Supine Quadricep Sets  - 1 x daily - 7 x weekly - 2 sets - 10 reps - 3 seconds hold Use of Mot[employer] Nova Foot device for at least 1hour every day  GOALS: Goals reviewed with patient? Yes  SHORT TERM GOALS: Target date: 07/13/2024  Patient will be independent in home exercise program to improve strength/mobility for better functional independence with ADLs.  Baseline: initiated on 03/10/2024 04/14/2024: continued Goal status: IN PROGRESS   LONG TERM GOALS: Target date: 08/24/2024  Patient (< 64 years old) will complete five times sit to stand (5XSTS) test in < 10 seconds indicating an increased LE strength and improved balance.  Baseline: 03/15/2024: 16.53 seconds from standard green chair relying on R hand to push-up from armrest and pt's personal SBQC nearby, but not using it VERSUS 25.85 seconds with R hand across chest, with min A for balance/safety without use of UE support 04/14/2024: 8.96 seconds using R UE support to push-up from armrest and pt's SBQC next to him, 13.75 seconds with R hand across chest with min A for therapist to stabilize chair behind him 05/30/24: 13.01 sec with R UE support on chair; 15.26 sec with R hand across chest and minA from therapist to stabilize chair behind pt Goal status: IN PROGRESS  2.   Patient will increase Berg Balance score to > 45/56 to demonstrate improved balance and decreased fall risk during functional activities and ADLs.  Baseline: 03/15/2024: 14/56 04/14/2024: 22/56 05/30/24: TBD at next visit Goal status: IN PROGRESS  3.  Patient will increase 10 meter walk test to >1.62m/s using LRAD as to improve gait speed for better community ambulation and to reduce fall risk.  Baseline: 0.30 m/s using small based quad cane with CGA and 1x light min A due to balance instability  04/14/2024: 0.405 m/s using SBQC with close SBA for safety 05/30/24: 0.379 m/s using SBQC with close SBA for safety Goal status: IN PROGRESS  4.  Patient will increase six minute walk test distance to >524ft for progression towards community ambulator and improve gait ability  Baseline: 25ft with [facility]  04/20/2024:  244ft with SBQC and SBA 05/30/24: 360 ft with SBQC Goal status: IN PROGRESS  5.  Patient will reduce timed up and go to <15 seconds to reduce fall risk and demonstrate improved transfer/gait ability. Baseline: 03/15/2024: 25.32 seconds using SBQC and chair with armrest as well as CGA for safety/steadying 04/14/2024: 22.25 seconds using SBQC & chair w/ armrest and close SBA for safety 05/30/24: 26.87 seconds w/ SBQC and use  Goal status: IN PROGRESS  6.  Patient will ascend/descend 4 stairs, using railing, independently without loss of balance to improve ability to get in/out of home.   Baseline: 03/31/2024: using R UE support on each HR with skilled min assist for balance using primarily step-to pattern 04/20/2024:  using R UE support on each HR with skilled CGA for safety using step-to pattern 05/30/24: continued use of R UE support on HR with skilled CGA for safety using step-to pattern Goal status: IN PROGRESS  ASSESSMENT:  CLINICAL IMPRESSION:    Patient received botox  injections in L LE biceps femoris on  06/24/2024 with pt reporting his L LE feels looser now. Therapy session focused on higher  intensity gait training in litegait harness with focus on L LE NMR post-botox  injections. Patient continues to demonstrate similar gait mechanics compared to prior to the botox  injections; however, did notice decreased L LE toe clearance during swing as pt becomes fatigued (may be due to pt's LE not resting in as much flexion following the botox ; however, will see how it progresses as botox  takes more effect). Patient progressed to backwards gait training today focusing on activation of knee flexors with hip extensors and patient benefiting from manual facilitation; however, it causes too significant of a stretch on his calf; therefore, had to decrease facilitation. Pt will benefit from continued dynamic gait training including backwards gait due to impaired posterior momentum and decreased ability to step L LE posteriorly. Pt will continue to benefit from skilled therapy to address remaining deficits in order to improve overall QoL and return to PLOF.     OBJECTIVE IMPAIRMENTS: Abnormal gait, cardiopulmonary status limiting activity, decreased activity tolerance, decreased balance, decreased coordination, decreased endurance, decreased knowledge of use of DME, decreased mobility, difficulty walking, decreased ROM, decreased strength, hypomobility, impaired flexibility, impaired sensation, impaired tone, impaired UE functional use, improper body mechanics, postural dysfunction, and pain.   ACTIVITY LIMITATIONS: carrying, lifting, bending, standing, squatting, sleeping, stairs, transfers, bed mobility, continence, bathing, toileting, dressing, reach over head, hygiene/grooming, locomotion level, and caring for others  PARTICIPATION LIMITATIONS: meal prep, cleaning, laundry, medication management, shopping, community activity, and yard work  PERSONAL FACTORS: Age, Past/current experiences, Time since onset of injury/illness/exacerbation, Transportation, and 3+ comorbidities: HTN, hx of kidney stones,  depression are also affecting patient's functional outcome.   REHAB POTENTIAL: Good  CLINICAL DECISION MAKING: Evolving/moderate complexity  EVALUATION COMPLEXITY: Moderate  PLAN:  PT FREQUENCY: 1-2x/week  PT DURATION: 12 weeks  PLANNED INTERVENTIONS: [id number]- PT Re-evaluation, 97750- Physical Performance Testing, 97110-Therapeutic exercises, 97530- Therapeutic activity, [id number]- Neuromuscular re-education, 97535- Self Care, [id number]- Manual therapy, [phone]- Gait training, [phone]- Orthotic/Prosthetic subsequent, [phone]- Canalith repositioning, [phone]- Electrical stimulation (manual), Patient/Family education, Balance training, Stair training, Taping, Dry Needling, Joint mobilization, Joint manipulation, Vestibular training, Visual/preceptual remediation/compensation, DME instructions, Cryotherapy, Moist heat, and Biofeedback  PLAN FOR NEXT SESSION:   Progress HEP  *Discuss insurance auth period* high intensity gait training using +2 HHA vs litegait support vs SBQC continue adding 7lb AW to L LE when appropriate vs decreasing RUE support/reliance while in litegait Backwards gait training - continue Stepping over obstacles Continue stair training for L LE NMR For L stance & swing phase L LE stretching and weightbearing for tone management and to improve alignment during gait/standing (avoid excessive hip ER) Non gait training ideas: bridges with R foot placed on unstable surface to promote increased L LE wt bearing - BOTOX  appointment 06/24/24   Connell Kiss, PT, DPT, NCS, CSRS Physical Therapist - [facility]  3:09 PM 06/28/24

## 2024-06-30 ENCOUNTER — Ambulatory Visit: Admitting: Physical Therapy

## 2024-06-30 DIAGNOSIS — R2681 Unsteadiness on feet: Secondary | ICD-10-CM

## 2024-06-30 DIAGNOSIS — I69354 Hemiplegia and hemiparesis following cerebral infarction affecting left non-dominant side: Secondary | ICD-10-CM

## 2024-06-30 DIAGNOSIS — R278 Other lack of coordination: Secondary | ICD-10-CM | POA: Diagnosis not present

## 2024-06-30 DIAGNOSIS — R2689 Other abnormalities of gait and mobility: Secondary | ICD-10-CM | POA: Diagnosis not present

## 2024-06-30 DIAGNOSIS — M6281 Muscle weakness (generalized): Secondary | ICD-10-CM | POA: Diagnosis not present

## 2024-06-30 DIAGNOSIS — R29818 Other symptoms and signs involving the nervous system: Secondary | ICD-10-CM | POA: Diagnosis not present

## 2024-06-30 DIAGNOSIS — R252 Cramp and spasm: Secondary | ICD-10-CM | POA: Diagnosis not present

## 2024-06-30 NOTE — Therapy (Signed)
 OUTPATIENT PHYSICAL THERAPY NEURO TREATMENT   Patient Name: Matthew Black MRN: [id number] DOB:1976-05-20, 48 y.o., male Today's Date: 06/30/2024   PCP: Dorina Loving, PA-C REFERRING PROVIDER: Lorilee Sven SQUIBB, MD  END OF SESSION:   PT End of Session - 06/30/24 1534     Visit Number 27    Number of Visits 48    Date for PT Re-Evaluation 08/24/24    Authorization Type [employer] [id number] for 8 PT vsts from 06/06/2024 - 07/04/2024  (8/5 is 5 of 8)    PT Start Time 1537    PT Stop Time 1615    PT Time Calculation (min) 38 min    Equipment Utilized During Treatment Gait belt;Other (comment)   L LE AFO   Activity Tolerance Patient tolerated treatment well    Behavior During Therapy [facility] for tasks assessed/performed           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   Stroke (HCC)    RICA, R A1, R MCA occlusion 07/19/19   Past Surgical History:  Procedure Laterality Date   CRANIOPLASTY Right 06/05/2020   Procedure: CRANIOPLASTY;  Surgeon: Lanis Pupa, MD;  Location: [facility] OR;  Service: Neurosurgery;  Laterality: Right;  right   CRANIOTOMY Right 07/19/2019   Procedure: RIGHT HEMI-CRANIECTOMY With implantation of skull flap to abdominal wall;  Surgeon: Lanis Pupa, MD;  Location: [facility] OR;  Service: Neurosurgery;  Laterality: Right;   CYST EXCISION N/A 10/08/2016   Procedure: EXCISION OF POSTERIOR NECK CYST;  Surgeon: Mitzie DELENA Freund, MD;  Location: [facility] ORS;  Service: General;  Laterality: N/A;   CYSTOSCOPY/URETEROSCOPY/HOLMIUM LASER/STENT PLACEMENT Right 03/16/2020    Procedure: CYSTOSCOPY RIGHT RETROGRADE PYELOGRAM URETEROSCOPY/HOLMIUM LASER/STENT PLACEMENT;  Surgeon: Carolee Sherwood JONETTA DOUGLAS, MD;  Location: [facility] ORS;  Service: Urology;  Laterality: Right;   INCISION AND DRAINAGE ABSCESS N/A 09/22/2014   Procedure: INCISION AND DRAINAGE ABSCESS POSTERIOR NECK;  Surgeon: Donnice KATHEE Lunger, MD;  Location: [facility] ORS;  Service: General;  Laterality: N/A;   INCISION AND DRAINAGE ABSCESS N/A 12/20/2015   Procedure: INCISION AND DRAINAGE POSTERIOR NECK MASS;  Surgeon: Krystal Spinner, MD;  Location: [facility] ORS;  Service: General;  Laterality: N/A;   INCISION AND DRAINAGE ABSCESS Left 07/10/2004   middle finger   IR IVC FILTER PLMT / S&I /IMG GUID/MOD SED  07/21/2019   IR RADIOLOGIST EVAL & MGMT  12/14/2019   IR RADIOLOGIST EVAL & MGMT  12/21/2020   IR VENOGRAM RENAL UNI RIGHT  07/21/2019   MASS EXCISION N/A 07/21/2017   Procedure: EXCISION OF BENIGN NECK LESION WITH LAYERED CLOSURE;  Surgeon: Arelia Filippo, MD;  Location: [facility];  Service: Plastics;  Laterality: N/A;   MASS EXCISION N/A 11/10/2017   Procedure: EXCISION BENIGN LESION OF THE NECK WITH LAYERED CLOSURE;  Surgeon: Arelia Filippo, MD;  Location: [facility];  Service: Plastics;  Laterality: N/A;   Patient Active Problem List   Diagnosis Date Noted   Hx of adenomatous colonic polyps 06/01/2023   COVID-19 virus infection 04/21/2021  Left-sided weakness 04/21/2021   S/P craniotomy 06/05/2020   History of cranioplasty 06/05/2020   Peri-rectal abscess 02/14/2020   Abnormal CT scan, pelvis 02/14/2020   Pancytopenia (HCC) 12/02/2019   Nephrolithiasis 12/02/2019   Hydronephrosis with renal and ureteral calculus obstruction 12/02/2019   Rectal pain 12/02/2019   Hyperkalemia 12/02/2019   Reactive depression    Wound infection after surgery    Sleep disturbance    Dysphagia, post-stroke    Transaminitis    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   Cerebral abscess     Urinary tract infection without hematuria    Altered mental status    Primary hypercoagulable state (HCC)    Acute pulmonary embolism without acute cor pulmonale (HCC)    Deep vein thrombosis (DVT) of non-extremity vein    Hypokalemia    Acute blood loss anemia    Leukocytosis    Endotracheal tube present    Acute respiratory failure with hypoxemia (HCC)    Stroke (cerebrum) (HCC) 07/19/2019   Pressure injury of skin 07/19/2019   Acute CVA (cerebrovascular accident) (HCC)    Encephalopathy    Dysphagia    Acute encephalopathy    Essential hypertension    Obesity 03/12/2016   Scalp abscess 12/20/2015   Neck abscess 12/20/2015   Pilonidal cyst 02/08/2013    ONSET DATE: stroke in August 2020  REFERRING DIAG: I63.9 (ICD-10-CM) - Cerebrovascular accident (CVA), unspecified mechanism (HCC)   THERAPY DIAG:   Hemiplegia and hemiparesis following cerebral infarction affecting left non-dominant side (HCC)  Muscle weakness (generalized)  Other lack of coordination  Unsteadiness on feet  Other abnormalities of gait and mobility  Spastic hemiplegia of left nondominant side as late effect of cerebral infarction [facility])   Rationale for Evaluation and Treatment: Rehabilitation  SUBJECTIVE:                                                                                                                                                                                             SUBJECTIVE STATEMENT:  Pt states he is doing OK. Reports L LE is still feeling good following the botox . Denies stumbles/falls. Denies pain. Pt reports he hasn't felt like he has had good luck in life recently, but doesn't want to discuss it further at this time.  Of note: Botox  in L LE biceps femoris on 06/24/2024 Has the Motus Nova Foot device    Initial Eval: Patient reports his goal is to walk without using a cane and without assistance. Patient states he wants to walk like he did before having the stroke.  Reports his stroke was in August of 2020.  States he currently uses a quad cane for ambulation and tries to walk as much as possible. Reports he has a manual wheelchair, but states he tries not to use it because it depresses him and makes him feel like he is back to when he first had his stroke. Reports wearing L LE custom AFO at all times.   Reports he is currently doing everything mod-I, but states he could call his mom, dad, or brothers if he needed assistance.  Reports he has routine botox  injections in L UE to help manage tone, but doesn't receive any in his LE.  Reports he has a hospital bed, but sleeps in a regular bed.  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  Etiology of carotid occlusion unclear possibly dissection.  He had a prolonged hospital admission with several complications including DVT, pulmonary embolism, hemorrhagic transformation, abdominal wall hematoma, UTI but made quite remarkable recovery   PAIN:  Are you having pain? No, denies pain today, but states sometimes will have L shoulder pain  PRECAUTIONS: Fall  RED FLAGS: None   WEIGHT BEARING RESTRICTIONS: No  FALLS: Has patient fallen in last 6 months? Yes, stating he fell down his stairs ~5 months ago and is now scared to perform stair navigation  LIVING ENVIRONMENT: Lives with: lives alone Lives in: House/apartment Stairs: Yes: Internal: flight steps; can reach both and External: 4 steps; can reach both, but states he doesn't have to go upstairs Has following equipment at home: Quad  cane small base, Hemi walker, Wheelchair (manual), shower chair, Shower bench, and L LE custom AFO Pt has custom K5 wheelchair with rigid, contoured back support and Roho cushion. Pt reports he calls [employer] to come adjust his seat cushion when he feels it is getting uncomfortable.  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  DIAGNOSTIC FINDINGS:  EXAM: CT ANGIOGRAPHY HEAD AND NECK WITH AND WITHOUT CONTRAST IMPRESSION: 1. No acute intracranial abnormality. 2. Chronically occluded right ICA with minimal opacification of the right MCA and ACA branches. 3. Redemonstrated extensive encephalomalacia in the right MCA and ACA territories secondary to a prior infarct.  Electronically Signed   By: Lyndall Gore M.D.   On: 11/27/2023 13:45  COGNITION: Overall cognitive status: Within functional limits for tasks assessed  VISION (03/29/2024): Therapist performed visual field screen with pt appearing to have L homonomous hemianopsia - pt confirms he has loss of vision on L side.   SENSATION: Light touch: Impaired in L LE Proprioception: Impaired  in L LE  Can feel deep pressure in L LE   COORDINATION: Impaired in L LE due to paresis, hypertonia, and overall decreased flexibility  EDEMA:  Not formally assessed  MUSCLE TONE: LLE: Mild, Moderate, and Hypertonic  MUSCLE LENGTH: Not  formally assessed; however, demonstrates L hamstring muscle tightness  DTRs:  Not formally assessed  POSTURE: rounded shoulders, forward head, and weight shift  right  LOWER EXTREMITY ROM:       Right Eval Active ROM Left Eval Passive ROM  Hip flexion WFL Achieves at least 90  Hip extension    Hip abduction    Hip adduction    Hip internal rotation  Unable to achieve neutral hip rotation, lacking internal rotation  Hip external rotation  [facility] and rests in excessive ER  Knee flexion [facility]  Knee extension WFL Able to achieve terminal knee extension in supine, but with significant discomfort due to hamstring tone  Ankle dorsiflexion WFL Did not remove AFO to assess this date  Ankle plantarflexion WFL Did not remove AFO to assess this date  Ankle inversion    Ankle eversion     (Blank rows = not tested)  LOWER EXTREMITY MMT:    MMT Right Eval Left Eval  Hip flexion 4+ 2-  (compensates with hip adductor activation)  Hip extension    Hip abduction    Hip adduction  3+  Hip internal rotation  2-  Hip external rotation  2  Knee flexion 4+ 1  Knee extension 4+ 3+  Ankle dorsiflexion 4+ Did not remove AFO to assess this date  Ankle plantarflexion 4+ Did not remove AFO to assess this date  Ankle inversion    Ankle eversion    (Blank rows = not tested)  Manual Muscle Test Scale 0/5 = No muscle contraction can be seen or felt 1/5 = Contraction can be felt, but there is no motion 2-/5 = Part moves through incomplete ROM w/ gravity decreased 2/5 = Part moves through complete ROM w/ gravity decreased 2+/5 = Part moves through incomplete ROM (<50%) against gravity or through complete ROM w/ gravity 3-/5 = Part moves through incomplete ROM (>50%) against gravity 3/5 = Part moves through complete ROM against gravity 3+/5 = Part moves through complete ROM against gravity/slight resistance 4-/5= Holds test position against slight to moderate pressure 4/5 = Part moves through complete ROM against gravity/moderate resistance 4+/5= Holds test position against moderate to strong pressure 5/5 = Part moves through complete ROM against  gravity/full resistance  BED MOBILITY: (sleeps in regular bed, but has hospital bed available) Findings: Sit to supine CGA Supine to sit Min A and Mod A Rolling to Right Mod A Rolling to Left Min A and Mod A  TRANSFERS: Sit to stand: CGA and Min A  Assistive device utilized: Counselling psychologist     Stand to sit: CGA and Min A  Assistive device utilized: Tree surgeon to chair: Min A  Assistive device utilized: Counselling psychologist       RAMP:  Not tested  CURB:  Not tested  STAIRS: Not tested *need to assess*  GAIT: Findings: advances L LE with excessive hip external rotation using hip adductors, step to pattern, decreased arm swing- Left, decreased step length- Right, decreased stance time- Left, decreased stride length, decreased hip/knee flexion- Left, decreased ankle dorsiflexion- Left, lateral lean- Right, and decreased trunk rotation Distance walked: ~47ft Assistive device utilized: small based quad cane in R UE  Level of assistance: CGA and Min A  FUNCTIONAL TESTS:  5 times sit to stand: need to assess Timed up and go (TUG): need to assess 6 minute walk test: need to assess 10 meter walk test: 0.42m/s  using small based quad cane and CGA Berg Balance Scale: need to assess  PATIENT SURVEYS:  Stroke Impact Scale 03/15/2024: 49/80                                                                                                                               TREATMENT DATE: 06/30/2024  Gait training ~172ft into therapy clinic using SBQC with SBA - pt demos increased step lengths bilaterally with reciprocal pattern and improving gait speed along with longer L stance phase; however, continues to have L LE mild to moderately externally rotated.  Donned 7lb AW on L LE   Gait training ~334ft using SBQC with skilled light min assist for balance, but primarily for facilitating increased L stance phase time with increased L hip extension and forward progression of  pelvis over L stance to allow longer R step length for consistent reciprocal stepping pattern. Cuing throughout to look up as pt with tendency for downward gaze at floor.   L LE NMR at stairs with R UE support on handrail via: Stepping on 1st step with R LE and then tapping 2nd step with L LE 6 reps with 7lb AW + 2 reps without AW Therapist initially providing manual facilitation for L LE hip/knee flexion when lifting/lowering; pt demos improved ability to perform this movement after repetition with only moderate circumduction compensation External cue via cone introduced at lateral aspect of 2nd step in order to minimize circumduction compensation Improved upright posture when stepping forward; pt continued to demo increased flexed posture when stepping backwards *Pt reports this as very challenging   Stair navigation training ascending/descending 4 steps (6 height) using R UE support on each HR with skilled heavy min assist - cuing for reciprocal stepping pattern on ascent for L LE NMR targeting both swing and stance phases of gait (pt with difficulty powering up through L LE to advance R LE in reciprocal pattern, slight knee giving way) - step-to pattern on descent leading with L LE.   Pt reporting fatigue at end of session. Pt in transport chair with mobility tech at end of session.   PATIENT EDUCATION: Education details: Therapy POC, LTGs, HEP, importance of improving L LE flexibility, findings during assessment  Person educated: Patient Education method: Explanation and Handouts Education comprehension: verbalized understanding and needs further education  HOME EXERCISE PROGRAM:  Access Code: [email or web address] URL: [email or web address] Date: 03/10/2024 Prepared by: Connell Kiss  Exercises - Seated Hip Adduction Isometrics with Ball  - 1 x daily - 7 x weekly - 2 sets - 10 reps - 5 seconds hold - Supine Quadricep Sets  - 1 x daily - 7 x weekly - 2 sets - 10 reps - 3 seconds  hold Use of Motus Nova Foot device for at least 1hour every day  GOALS: Goals reviewed with patient? Yes  SHORT TERM GOALS: Target date: 07/13/2024  Patient will be independent in home exercise program to improve strength/mobility for  better functional independence with ADLs.  Baseline: initiated on 03/10/2024 04/14/2024: continued Goal status: IN PROGRESS   LONG TERM GOALS: Target date: 08/24/2024  Patient (< 23 years old) will complete five times sit to stand (5XSTS) test in < 10 seconds indicating an increased LE strength and improved balance.  Baseline: 03/15/2024: 16.53 seconds from standard green chair relying on R hand to push-up from armrest and pt's personal SBQC nearby, but not using it VERSUS 25.85 seconds with R hand across chest, with min A for balance/safety without use of UE support 04/14/2024: 8.96 seconds using R UE support to push-up from armrest and pt's SBQC next to him, 13.75 seconds with R hand across chest with min A for therapist to stabilize chair behind him 05/30/24: 13.01 sec with R UE support on chair; 15.26 sec with R hand across chest and minA from therapist to stabilize chair behind pt Goal status: IN PROGRESS  2.  Patient will increase Berg Balance score to > 45/56 to demonstrate improved balance and decreased fall risk during functional activities and ADLs.  Baseline: 03/15/2024: 14/56 04/14/2024: 22/56 05/30/24: TBD at next visit Goal status: IN PROGRESS  3.  Patient will increase 10 meter walk test to >1.108m/s using LRAD as to improve gait speed for better community ambulation and to reduce fall risk.  Baseline: 0.30 m/s using small based quad cane with CGA and 1x light min A due to balance instability  04/14/2024: 0.405 m/s using SBQC with close SBA for safety 05/30/24: 0.379 m/s using SBQC with close SBA for safety Goal status: IN PROGRESS  4.  Patient will increase six minute walk test distance to >556ft for progression towards community ambulator and improve  gait ability  Baseline: 264ft with [facility]  04/20/2024:  260ft with SBQC and SBA 05/30/24: 360 ft with SBQC Goal status: IN PROGRESS  5.  Patient will reduce timed up and go to <15 seconds to reduce fall risk and demonstrate improved transfer/gait ability. Baseline: 03/15/2024: 25.32 seconds using SBQC and chair with armrest as well as CGA for safety/steadying 04/14/2024: 22.25 seconds using SBQC & chair w/ armrest and close SBA for safety 05/30/24: 26.87 seconds w/ SBQC and use  Goal status: IN PROGRESS  6.  Patient will ascend/descend 4 stairs, using railing, independently without loss of balance to improve ability to get in/out of home.   Baseline: 03/31/2024: using R UE support on each HR with skilled min assist for balance using primarily step-to pattern 04/20/2024:  using R UE support on each HR with skilled CGA for safety using step-to pattern 05/30/24: continued use of R UE support on HR with skilled CGA for safety using step-to pattern Goal status: IN PROGRESS  ASSESSMENT:  CLINICAL IMPRESSION:    Therapy session focused on higher intensity gait training using SBQC with 7lb AW on L LE to promote increased neural recruitment for improved swing phase clearance. Patient also participated in L LE NMR at stairs focusing on transitioning from hip/knee extension during stance to hip/knee flexion during swing to perform reciprocal foot tapping. Patient demonstrates decreased ability to sustain L LE extension while stepping R LE up, but demos improving flexion movement with decreased circumduction compensation. Pt will benefit from continued dynamic gait training and L LE NMR including backwards gait due to impaired posterior momentum and decreased ability to step L LE posteriorly. Pt will continue to benefit from skilled therapy to address remaining deficits in order to improve overall QoL and return to PLOF.  OBJECTIVE IMPAIRMENTS: Abnormal gait, cardiopulmonary status limiting activity, decreased  activity tolerance, decreased balance, decreased coordination, decreased endurance, decreased knowledge of use of DME, decreased mobility, difficulty walking, decreased ROM, decreased strength, hypomobility, impaired flexibility, impaired sensation, impaired tone, impaired UE functional use, improper body mechanics, postural dysfunction, and pain.   ACTIVITY LIMITATIONS: carrying, lifting, bending, standing, squatting, sleeping, stairs, transfers, bed mobility, continence, bathing, toileting, dressing, reach over head, hygiene/grooming, locomotion level, and caring for others  PARTICIPATION LIMITATIONS: meal prep, cleaning, laundry, medication management, shopping, community activity, and yard work  PERSONAL FACTORS: Age, Past/current experiences, Time since onset of injury/illness/exacerbation, Transportation, and 3+ comorbidities: HTN, hx of kidney stones, depression are also affecting patient's functional outcome.   REHAB POTENTIAL: Good  CLINICAL DECISION MAKING: Evolving/moderate complexity  EVALUATION COMPLEXITY: Moderate  PLAN:  PT FREQUENCY: 1-2x/week  PT DURATION: 12 weeks  PLANNED INTERVENTIONS: [id number]- PT Re-evaluation, 97750- Physical Performance Testing, 97110-Therapeutic exercises, 97530- Therapeutic activity, [id number]- Neuromuscular re-education, 97535- Self Care, [id number]- Manual therapy, [id number]- Gait training, [phone]- Orthotic/Prosthetic subsequent, [phone]- Canalith repositioning, [phone]- Electrical stimulation (manual), Patient/Family education, Balance training, Stair training, Taping, Dry Needling, Joint mobilization, Joint manipulation, Vestibular training, Visual/preceptual remediation/compensation, DME instructions, Cryotherapy, Moist heat, and Biofeedback  PLAN FOR NEXT SESSION:   Progress HEP  high intensity gait training using +2 HHA vs litegait support vs SBQC continue adding 7lb AW to L LE when appropriate vs decreasing RUE support/reliance while in litegait Backwards  gait training - continue Stepping over obstacles Continue stair training for L LE NMR For stance   R foot taps while sustaining L hip extension For swing phase Reciprocal foot taps L LE stretching and weightbearing for tone management and to improve alignment during gait/standing (avoid excessive hip ER) Non gait training ideas: bridges with R foot placed on unstable surface to promote increased L LE wt bearing - BOTOX  appointment 06/24/24   Connell Kiss, PT, DPT, NCS, CSRS Physical Therapist - [facility]  4:45 PM 06/30/24

## 2024-07-05 ENCOUNTER — Ambulatory Visit: Admitting: Physical Therapy

## 2024-07-07 ENCOUNTER — Ambulatory Visit: Admitting: Physical Therapy

## 2024-07-07 DIAGNOSIS — R278 Other lack of coordination: Secondary | ICD-10-CM | POA: Diagnosis not present

## 2024-07-07 DIAGNOSIS — R2681 Unsteadiness on feet: Secondary | ICD-10-CM | POA: Diagnosis not present

## 2024-07-07 DIAGNOSIS — R29818 Other symptoms and signs involving the nervous system: Secondary | ICD-10-CM | POA: Diagnosis not present

## 2024-07-07 DIAGNOSIS — I69354 Hemiplegia and hemiparesis following cerebral infarction affecting left non-dominant side: Secondary | ICD-10-CM

## 2024-07-07 DIAGNOSIS — R2689 Other abnormalities of gait and mobility: Secondary | ICD-10-CM

## 2024-07-07 DIAGNOSIS — M6281 Muscle weakness (generalized): Secondary | ICD-10-CM

## 2024-07-07 DIAGNOSIS — R252 Cramp and spasm: Secondary | ICD-10-CM | POA: Diagnosis not present

## 2024-07-07 NOTE — Therapy (Signed)
 OUTPATIENT PHYSICAL THERAPY NEURO TREATMENT   Patient Name: Matthew Black MRN: [id number] DOB:[birth year], 48 y.o., male Today's Date: 07/07/2024   PCP: Dorina Loving, PA-C REFERRING PROVIDER: Lorilee Sven SQUIBB, MD  END OF SESSION:   PT End of Session - 07/07/24 1318     Visit Number 28    Number of Visits 48    Date for PT Re-Evaluation 08/24/24    Authorization Type [employer] auth: [id number] for 16 pt vst from 8/12-10/07 (8/14 is 1 of 16)    PT Start Time 1318    PT Stop Time 1403    PT Time Calculation (min) 45 min    Equipment Utilized During Treatment Gait belt;Other (comment)   L LE AFO   Activity Tolerance Patient tolerated treatment well    Behavior During Therapy [facility] for tasks assessed/performed            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   Stroke (HCC)    RICA, R A1, R MCA occlusion 07/19/19   Past Surgical History:  Procedure Laterality Date   CRANIOPLASTY Right 06/05/2020   Procedure: CRANIOPLASTY;  Surgeon: Lanis Pupa, MD;  Location: [facility] OR;  Service: Neurosurgery;  Laterality: Right;  right   CRANIOTOMY Right 07/19/2019   Procedure: RIGHT HEMI-CRANIECTOMY With implantation of skull flap to abdominal wall;  Surgeon: Lanis Pupa, MD;  Location: [facility] OR;  Service: Neurosurgery;  Laterality: Right;   CYST EXCISION N/A 10/08/2016   Procedure: EXCISION OF POSTERIOR NECK CYST;  Surgeon: Mitzie DELENA Freund, MD;  Location: [facility] ORS;  Service: General;  Laterality: N/A;   CYSTOSCOPY/URETEROSCOPY/HOLMIUM LASER/STENT PLACEMENT Right 03/16/2020   Procedure:  CYSTOSCOPY RIGHT RETROGRADE PYELOGRAM URETEROSCOPY/HOLMIUM LASER/STENT PLACEMENT;  Surgeon: Carolee Sherwood JONETTA DOUGLAS, MD;  Location: [facility] ORS;  Service: Urology;  Laterality: Right;   INCISION AND DRAINAGE ABSCESS N/A 09/22/2014   Procedure: INCISION AND DRAINAGE ABSCESS POSTERIOR NECK;  Surgeon: Donnice KATHEE Lunger, MD;  Location: [facility]S;  Service: General;  Laterality: N/A;   INCISION AND DRAINAGE ABSCESS N/A 12/20/2015   Procedure: INCISION AND DRAINAGE POSTERIOR NECK MASS;  Surgeon: Krystal Spinner, MD;  Location: [facility]S;  Service: General;  Laterality: N/A;   INCISION AND DRAINAGE ABSCESS Left 07/10/2004   middle finger   IR IVC FILTER PLMT / S&I /IMG GUID/MOD SED  07/21/2019   IR RADIOLOGIST EVAL & MGMT  12/14/2019   IR RADIOLOGIST EVAL & MGMT  12/21/2020   IR VENOGRAM RENAL UNI RIGHT  07/21/2019   MASS EXCISION N/A 07/21/2017   Procedure: EXCISION OF BENIGN NECK LESION WITH LAYERED CLOSURE;  Surgeon: Arelia Filippo, MD;  Location: [facility];  Service: Plastics;  Laterality: N/A;   MASS EXCISION N/A 11/10/2017   Procedure: EXCISION BENIGN LESION OF THE NECK WITH LAYERED CLOSURE;  Surgeon: Arelia Filippo, MD;  Location: [facility];  Service: Plastics;  Laterality: N/A;   Patient Active Problem List   Diagnosis Date Noted   Hx of adenomatous colonic polyps 06/01/2023   COVID-19 virus infection 04/21/2021  Left-sided weakness 04/21/2021   S/P craniotomy 06/05/2020   History of cranioplasty 06/05/2020   Peri-rectal abscess 02/14/2020   Abnormal CT scan, pelvis 02/14/2020   Pancytopenia (HCC) 12/02/2019   Nephrolithiasis 12/02/2019   Hydronephrosis with renal and ureteral calculus obstruction 12/02/2019   Rectal pain 12/02/2019   Hyperkalemia 12/02/2019   Reactive depression    Wound infection after surgery    Sleep disturbance    Dysphagia, post-stroke    Transaminitis    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   Cerebral abscess    Urinary tract  infection without hematuria    Altered mental status    Primary hypercoagulable state (HCC)    Acute pulmonary embolism without acute cor pulmonale (HCC)    Deep vein thrombosis (DVT) of non-extremity vein    Hypokalemia    Acute blood loss anemia    Leukocytosis    Endotracheal tube present    Acute respiratory failure with hypoxemia (HCC)    Stroke (cerebrum) (HCC) 07/19/2019   Pressure injury of skin 07/19/2019   Acute CVA (cerebrovascular accident) (HCC)    Encephalopathy    Dysphagia    Acute encephalopathy    Essential hypertension    Obesity 03/12/2016   Scalp abscess 12/20/2015   Neck abscess 12/20/2015   Pilonidal cyst 02/08/2013    ONSET DATE: stroke in August 2020  REFERRING DIAG: I63.9 (ICD-10-CM) - Cerebrovascular accident (CVA), unspecified mechanism (HCC)   THERAPY DIAG:   Hemiplegia and hemiparesis following cerebral infarction affecting left non-dominant side (HCC)  Muscle weakness (generalized)  Other lack of coordination  Unsteadiness on feet  Other abnormalities of gait and mobility  Spastic hemiplegia of left nondominant side as late effect of cerebral infarction [facility])   Rationale for Evaluation and Treatment: Rehabilitation  SUBJECTIVE:                                                                                                                                                                                             SUBJECTIVE STATEMENT:  Pt states he is excited for the stroke support group meeting this afternoon. Pt reports overall he is doing good. No updates and states his L LE is still doing well following the botox  injections. Denies stumbles/falls. Denies pain.   Of note: Botox  in L LE biceps femoris on 06/24/2024 Has the Motus Nova Foot device    Initial Eval: Patient reports his goal is to walk without using a cane and without assistance. Patient states he wants to walk like he did before having the stroke. Reports his stroke was  in August of 2020. States he currently uses  a quad cane for ambulation and tries to walk as much as possible. Reports he has a manual wheelchair, but states he tries not to use it because it depresses him and makes him feel like he is back to when he first had his stroke. Reports wearing L LE custom AFO at all times.   Reports he is currently doing everything mod-I, but states he could call his mom, dad, or brothers if he needed assistance.  Reports he has routine botox  injections in L UE to help manage tone, but doesn't receive any in his LE.  Reports he has a hospital bed, but sleeps in a regular bed.  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  Etiology of carotid occlusion unclear possibly dissection.  He had a prolonged hospital admission with several complications including DVT, pulmonary embolism, hemorrhagic transformation, abdominal wall hematoma, UTI but made quite remarkable recovery   PAIN:  Are you having pain? No, denies pain today, but states sometimes will have L shoulder pain  PRECAUTIONS: Fall  RED FLAGS: None   WEIGHT BEARING RESTRICTIONS: No  FALLS: Has patient fallen in last 6 months? Yes, stating he fell down his stairs ~5 months ago and is now scared to perform stair navigation  LIVING ENVIRONMENT: Lives with: lives alone Lives in: House/apartment Stairs: Yes: Internal: flight steps; can reach both and External: 4 steps; can reach both, but states he doesn't have to go upstairs Has following equipment at home: Quad cane small base, Hemi  walker, Wheelchair (manual), shower chair, Shower bench, and L LE custom AFO Pt has custom K5 wheelchair with rigid, contoured back support and Roho cushion. Pt reports he calls [employer] to come adjust his seat cushion when he feels it is getting uncomfortable.  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  DIAGNOSTIC FINDINGS:  EXAM: CT ANGIOGRAPHY HEAD AND NECK WITH AND WITHOUT CONTRAST IMPRESSION: 1. No acute intracranial abnormality. 2. Chronically occluded right ICA with minimal opacification of the right MCA and ACA branches. 3. Redemonstrated extensive encephalomalacia in the right MCA and ACA territories secondary to a prior infarct.  Electronically Signed   By: Lyndall Gore M.D.   On: 11/27/2023 13:45  COGNITION: Overall cognitive status: Within functional limits for tasks assessed  VISION (03/29/2024): Therapist performed visual field screen with pt appearing to have L homonomous hemianopsia - pt confirms he has loss of vision on L side.   SENSATION: Light touch: Impaired in L LE Proprioception: Impaired  in L LE  Can feel deep pressure in L LE   COORDINATION: Impaired in L LE due to paresis, hypertonia, and overall decreased flexibility  EDEMA:  Not formally assessed  MUSCLE TONE: LLE: Mild, Moderate, and Hypertonic  MUSCLE LENGTH: Not formally assessed; however, demonstrates  L hamstring muscle tightness  DTRs:  Not formally assessed  POSTURE: rounded shoulders, forward head, and weight shift right  LOWER EXTREMITY  ROM:       Right Eval Active ROM Left Eval Passive ROM  Hip flexion WFL Achieves at least 90  Hip extension    Hip abduction    Hip adduction    Hip internal rotation  Unable to achieve neutral hip rotation, lacking internal rotation  Hip external rotation  [facility] and rests in excessive ER  Knee flexion [facility]  Knee extension WFL Able to achieve terminal knee extension in supine, but with significant discomfort due to hamstring tone  Ankle dorsiflexion WFL Did not remove AFO to assess this date  Ankle plantarflexion WFL Did not remove AFO to assess this date  Ankle inversion    Ankle eversion     (Blank rows = not tested)  LOWER EXTREMITY MMT:    MMT Right Eval Left Eval  Hip flexion 4+ 2-  (compensates with hip adductor activation)  Hip extension    Hip abduction    Hip adduction  3+  Hip internal rotation  2-  Hip external rotation  2  Knee flexion 4+ 1  Knee extension 4+ 3+  Ankle dorsiflexion 4+ Did not remove AFO to assess this date  Ankle plantarflexion 4+ Did not remove AFO to assess this date  Ankle inversion    Ankle eversion    (Blank rows = not tested)  Manual Muscle Test Scale 0/5 = No muscle contraction can be seen or felt 1/5 = Contraction can be felt, but there is no motion 2-/5 = Part moves through incomplete ROM w/ gravity decreased 2/5 = Part moves through complete ROM w/ gravity decreased 2+/5 = Part moves through incomplete ROM (<50%) against gravity or through complete ROM w/ gravity 3-/5 = Part moves through incomplete ROM (>50%) against gravity 3/5 = Part moves through complete ROM against gravity 3+/5 = Part moves through complete ROM against gravity/slight resistance 4-/5= Holds test position against slight to moderate pressure 4/5 = Part moves through complete ROM against gravity/moderate resistance 4+/5= Holds test position against moderate to strong pressure 5/5 = Part moves through complete ROM against gravity/full resistance  BED  MOBILITY: (sleeps in regular bed, but has hospital bed available) Findings: Sit to supine CGA Supine to sit Min A and Mod A Rolling to Right Mod A Rolling to Left Min A and Mod A  TRANSFERS: Sit to stand: CGA and Min A  Assistive device utilized: Counselling psychologist     Stand to sit: CGA and Min A  Assistive device utilized: Tree surgeon to chair: Min A  Assistive device utilized: Counselling psychologist       RAMP:  Not tested  CURB:  Not tested  STAIRS: Not tested *need to assess*  GAIT: Findings: advances L LE with excessive hip external rotation using hip adductors, step to pattern, decreased arm swing- Left, decreased step length- Right, decreased stance time- Left, decreased stride length, decreased hip/knee flexion- Left, decreased ankle dorsiflexion- Left, lateral lean- Right, and decreased trunk rotation Distance walked: ~42ft Assistive device utilized: small based quad cane in R UE  Level of assistance: CGA and Min A  FUNCTIONAL TESTS:  5 times sit to stand: need to assess Timed up and go (TUG): need to assess 6 minute walk test: need to assess 10 meter walk test: 0.76m/s using small based quad  cane and CGA Berg Balance Scale: need to assess  PATIENT SURVEYS:  Stroke Impact Scale 03/15/2024: 49/80                                                                                                                               TREATMENT DATE: 07/07/2024  Gait training ~136ft into therapy clinic using SBQC with SBA - pt demos decreased step lengths today with more of a step-to pattern leading with L LE due to him being distracted with feeling like he has something in his eye. Continues to have L LE mild to moderately excessively externally rotated  Pt agreeable to continue use of litegait harness for more dynamic gait training with decreased reliance on R UE support.   Gait training ~364ft + agility ladder + backwards as described below (seated breaks  between) in litegait harness WITHOUT BWS and therapist managing litegait. Pt demonstrating the following gait deviations with therapist providing the described cuing and facilitation for improvement:  Performed initial ~165ft with only R pointer finger support During 2nd half, performed ~30ft of gait training without UE support Therapist continues facilitation of increased and prolonged L wt shift onto L stance phase  Pt continues to have L foot drag during swing that increases when not using R UE support, causing intermittent toe catching and anterior LOB L LE remains excessively externally rotated Forward gait through agility ladder x3 laps Goal of reciprocal stepping pattern and improving L LE foot clearance  Pt frequently slightly catches ladder reign with L toes, but is able to clear Performs reciprocal pattern ~40% of the time Backwards gait training ~24ft x2 with R UE support on litegait handle Goal of stepping L foot back enough to get toes to/past R heel for more reciprocal pattern and pt successful ~75% of the time today!  Although pt does have to use compensatory movement patterns at hip in order to step L foot back due to lack of knee flexion activation Improved control of posterior momentum Pt continues to report this as challenging  Doffed litegait harness.   PATIENT EDUCATION: Education details: Therapy POC, LTGs, HEP, importance of improving L LE flexibility, findings during assessment  Person educated: Patient Education method: Explanation and Handouts Education comprehension: verbalized understanding and needs further education  HOME EXERCISE PROGRAM:  Access Code: [email or web address] URL: [email or web address] Date: 03/10/2024 Prepared by: Connell Kiss  Exercises - Seated Hip Adduction Isometrics with Ball  - 1 x daily - 7 x weekly - 2 sets - 10 reps - 5 seconds hold - Supine Quadricep Sets  - 1 x daily - 7 x weekly - 2 sets - 10 reps - 3 seconds hold Use of  Motus Nova Foot device for at least 1hour every day  GOALS: Goals reviewed with patient? Yes  SHORT TERM GOALS: Target date: 07/13/2024  Patient will be independent in home exercise program to improve strength/mobility for better functional independence with ADLs.  Baseline: initiated on  03/10/2024 04/14/2024: continued Goal status: IN PROGRESS   LONG TERM GOALS: Target date: 08/24/2024  Patient (< 73 years old) will complete five times sit to stand (5XSTS) test in < 10 seconds indicating an increased LE strength and improved balance.  Baseline: 03/15/2024: 16.53 seconds from standard green chair relying on R hand to push-up from armrest and pt's personal SBQC nearby, but not using it VERSUS 25.85 seconds with R hand across chest, with min A for balance/safety without use of UE support 04/14/2024: 8.96 seconds using R UE support to push-up from armrest and pt's SBQC next to him, 13.75 seconds with R hand across chest with min A for therapist to stabilize chair behind him 05/30/24: 13.01 sec with R UE support on chair; 15.26 sec with R hand across chest and minA from therapist to stabilize chair behind pt Goal status: IN PROGRESS  2.  Patient will increase Berg Balance score to > 45/56 to demonstrate improved balance and decreased fall risk during functional activities and ADLs.  Baseline: 03/15/2024: 14/56 04/14/2024: 22/56 05/30/24: TBD at next visit Goal status: IN PROGRESS  3.  Patient will increase 10 meter walk test to >1.93m/s using LRAD as to improve gait speed for better community ambulation and to reduce fall risk.  Baseline: 0.30 m/s using small based quad cane with CGA and 1x light min A due to balance instability  04/14/2024: 0.405 m/s using SBQC with close SBA for safety 05/30/24: 0.379 m/s using SBQC with close SBA for safety Goal status: IN PROGRESS  4.  Patient will increase six minute walk test distance to >547ft for progression towards community ambulator and improve gait ability   Baseline: 265ft with [facility]  04/20/2024:  250ft with SBQC and SBA 05/30/24: 360 ft with SBQC Goal status: IN PROGRESS  5.  Patient will reduce timed up and go to <15 seconds to reduce fall risk and demonstrate improved transfer/gait ability. Baseline: 03/15/2024: 25.32 seconds using SBQC and chair with armrest as well as CGA for safety/steadying 04/14/2024: 22.25 seconds using SBQC & chair w/ armrest and close SBA for safety 05/30/24: 26.87 seconds w/ SBQC and use  Goal status: IN PROGRESS  6.  Patient will ascend/descend 4 stairs, using railing, independently without loss of balance to improve ability to get in/out of home.   Baseline: 03/31/2024: using R UE support on each HR with skilled min assist for balance using primarily step-to pattern 04/20/2024:  using R UE support on each HR with skilled CGA for safety using step-to pattern 05/30/24: continued use of R UE support on HR with skilled CGA for safety using step-to pattern Goal status: IN PROGRESS  ASSESSMENT:  CLINICAL IMPRESSION:  Therapy session focused on higher intensity gait training with use of litegait harness to decrease reliance on R UE support. Patient participated in dynamic gait training through agility ladder while in litegait to promote reciprocal stepping pattern, increased L LE stance time, and improved L LE foot clearance during swing. Patient continued participate in backwards gait training in litegait with pt having improved reciprocal stepping pattern, although has to use compensatory movement patterns in order to step L LE back due to lack of knee flexor activation. Pt will benefit from continued dynamic gait training and L LE NMR including backwards and variable direction gait due to decreased ability to step L LE in various directions. Pt will continue to benefit from skilled therapy to address remaining deficits in order to improve overall QoL and return to PLOF.  OBJECTIVE IMPAIRMENTS: Abnormal gait, cardiopulmonary  status limiting activity, decreased activity tolerance, decreased balance, decreased coordination, decreased endurance, decreased knowledge of use of DME, decreased mobility, difficulty walking, decreased ROM, decreased strength, hypomobility, impaired flexibility, impaired sensation, impaired tone, impaired UE functional use, improper body mechanics, postural dysfunction, and pain.   ACTIVITY LIMITATIONS: carrying, lifting, bending, standing, squatting, sleeping, stairs, transfers, bed mobility, continence, bathing, toileting, dressing, reach over head, hygiene/grooming, locomotion level, and caring for others  PARTICIPATION LIMITATIONS: meal prep, cleaning, laundry, medication management, shopping, community activity, and yard work  PERSONAL FACTORS: Age, Past/current experiences, Time since onset of injury/illness/exacerbation, Transportation, and 3+ comorbidities: HTN, hx of kidney stones, depression are also affecting patient's functional outcome.   REHAB POTENTIAL: Good  CLINICAL DECISION MAKING: Evolving/moderate complexity  EVALUATION COMPLEXITY: Moderate  PLAN:  PT FREQUENCY: 1-2x/week  PT DURATION: 12 weeks  PLANNED INTERVENTIONS: [id number]- PT Re-evaluation, 97750- Physical Performance Testing, 97110-Therapeutic exercises, 97530- Therapeutic activity, [id number]- Neuromuscular re-education, 97535- Self Care, [id number]- Manual therapy, [id number]- Gait training, [phone]- Orthotic/Prosthetic subsequent, [phone]- Canalith repositioning, [phone]- Electrical stimulation (manual), Patient/Family education, Balance training, Stair training, Taping, Dry Needling, Joint mobilization, Joint manipulation, Vestibular training, Visual/preceptual remediation/compensation, DME instructions, Cryotherapy, Moist heat, and Biofeedback  PLAN FOR NEXT SESSION:   Progress HEP  high intensity gait training using +2 HHA vs litegait support vs SBQC continue adding 7lb AW to L LE when appropriate vs decreasing RUE  support/reliance while in litegait Backwards gait training - continue Stepping over obstacles - continue Continue stair training for L LE NMR For stance   R foot taps while sustaining L hip extension For swing phase Reciprocal foot taps L LE stretching and weightbearing for tone management and to improve alignment during gait/standing (avoid excessive hip ER) Non gait training ideas: bridges with R foot placed on unstable surface to promote increased L LE wt bearing - BOTOX  appointment 06/24/24   Connell Kiss, PT, DPT, NCS, CSRS Physical Therapist - [facility]  2:31 PM 07/07/24

## 2024-07-12 DIAGNOSIS — I639 Cerebral infarction, unspecified: Secondary | ICD-10-CM | POA: Diagnosis not present

## 2024-07-13 ENCOUNTER — Ambulatory Visit: Admitting: Physical Therapy

## 2024-07-13 DIAGNOSIS — R2681 Unsteadiness on feet: Secondary | ICD-10-CM

## 2024-07-13 DIAGNOSIS — R29818 Other symptoms and signs involving the nervous system: Secondary | ICD-10-CM | POA: Diagnosis not present

## 2024-07-13 DIAGNOSIS — R278 Other lack of coordination: Secondary | ICD-10-CM | POA: Diagnosis not present

## 2024-07-13 DIAGNOSIS — M6281 Muscle weakness (generalized): Secondary | ICD-10-CM

## 2024-07-13 DIAGNOSIS — R2689 Other abnormalities of gait and mobility: Secondary | ICD-10-CM | POA: Diagnosis not present

## 2024-07-13 DIAGNOSIS — R252 Cramp and spasm: Secondary | ICD-10-CM | POA: Diagnosis not present

## 2024-07-13 DIAGNOSIS — I69354 Hemiplegia and hemiparesis following cerebral infarction affecting left non-dominant side: Secondary | ICD-10-CM | POA: Diagnosis not present

## 2024-07-13 NOTE — Therapy (Signed)
 OUTPATIENT PHYSICAL THERAPY NEURO TREATMENT   Patient Name: Matthew Black MRN: [id number] DOB:[birth year], 48 y.o., male Today's Date: 07/13/2024   PCP: Dorina Loving, PA-C REFERRING PROVIDER: Lorilee Sven SQUIBB, MD  END OF SESSION:   PT End of Session - 07/13/24 1537     Visit Number 29    Number of Visits 48    Date for PT Re-Evaluation 08/24/24    Authorization Type [employer] auth: [id number] for 16 pt vst from 8/12-10/07 (8/20 is 2 of 16)    PT Start Time 1535    PT Stop Time 1614    PT Time Calculation (min) 39 min    Equipment Utilized During Treatment Gait belt;Other (comment)   L LE AFO   Activity Tolerance Patient tolerated treatment well    Behavior During Therapy [facility] for tasks assessed/performed             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   Stroke (HCC)    RICA, R A1, R MCA occlusion 07/19/19   Past Surgical History:  Procedure Laterality Date   CRANIOPLASTY Right 06/05/2020   Procedure: CRANIOPLASTY;  Surgeon: Lanis Pupa, MD;  Location: [facility] OR;  Service: Neurosurgery;  Laterality: Right;  right   CRANIOTOMY Right 07/19/2019   Procedure: RIGHT HEMI-CRANIECTOMY With implantation of skull flap to abdominal wall;  Surgeon: Lanis Pupa, MD;  Location: [facility] OR;  Service: Neurosurgery;  Laterality: Right;   CYST EXCISION N/A 10/08/2016   Procedure: EXCISION OF POSTERIOR NECK CYST;  Surgeon: Mitzie DELENA Freund, MD;  Location: [facility] ORS;  Service: General;  Laterality: N/A;   CYSTOSCOPY/URETEROSCOPY/HOLMIUM LASER/STENT PLACEMENT Right 03/16/2020    Procedure: CYSTOSCOPY RIGHT RETROGRADE PYELOGRAM URETEROSCOPY/HOLMIUM LASER/STENT PLACEMENT;  Surgeon: Carolee Sherwood JONETTA DOUGLAS, MD;  Location: [facility];  Service: Urology;  Laterality: Right;   INCISION AND DRAINAGE ABSCESS N/A 09/22/2014   Procedure: INCISION AND DRAINAGE ABSCESS POSTERIOR NECK;  Surgeon: Donnice KATHEE Lunger, MD;  Location: [facility];  Service: General;  Laterality: N/A;   INCISION AND DRAINAGE ABSCESS N/A 12/20/2015   Procedure: INCISION AND DRAINAGE POSTERIOR NECK MASS;  Surgeon: Krystal Spinner, MD;  Location: [facility];  Service: General;  Laterality: N/A;   INCISION AND DRAINAGE ABSCESS Left 07/10/2004   middle finger   IR IVC FILTER PLMT / S&I /IMG GUID/MOD SED  07/21/2019   IR RADIOLOGIST EVAL & MGMT  12/14/2019   IR RADIOLOGIST EVAL & MGMT  12/21/2020   IR VENOGRAM RENAL UNI RIGHT  07/21/2019   MASS EXCISION N/A 07/21/2017   Procedure: EXCISION OF BENIGN NECK LESION WITH LAYERED CLOSURE;  Surgeon: Arelia Filippo, MD;  Location: [facility];  Service: Plastics;  Laterality: N/A;   MASS EXCISION N/A 11/10/2017   Procedure: EXCISION BENIGN LESION OF THE NECK WITH LAYERED CLOSURE;  Surgeon: Arelia Filippo, MD;  Location: [facility];  Service: Plastics;  Laterality: N/A;   Patient Active Problem List   Diagnosis Date Noted   Hx of adenomatous colonic polyps 06/01/2023   COVID-19 virus infection 04/21/2021  Left-sided weakness 04/21/2021   S/P craniotomy 06/05/2020   History of cranioplasty 06/05/2020   Peri-rectal abscess 02/14/2020   Abnormal CT scan, pelvis 02/14/2020   Pancytopenia (HCC) 12/02/2019   Nephrolithiasis 12/02/2019   Hydronephrosis with renal and ureteral calculus obstruction 12/02/2019   Rectal pain 12/02/2019   Hyperkalemia 12/02/2019   Reactive depression    Wound infection after surgery    Sleep disturbance    Dysphagia, post-stroke    Transaminitis    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   Cerebral abscess     Urinary tract infection without hematuria    Altered mental status    Primary hypercoagulable state (HCC)    Acute pulmonary embolism without acute cor pulmonale (HCC)    Deep vein thrombosis (DVT) of non-extremity vein    Hypokalemia    Acute blood loss anemia    Leukocytosis    Endotracheal tube present    Acute respiratory failure with hypoxemia (HCC)    Stroke (cerebrum) (HCC) 07/19/2019   Pressure injury of skin 07/19/2019   Acute CVA (cerebrovascular accident) (HCC)    Encephalopathy    Dysphagia    Acute encephalopathy    Essential hypertension    Obesity 03/12/2016   Scalp abscess 12/20/2015   Neck abscess 12/20/2015   Pilonidal cyst 02/08/2013    ONSET DATE: stroke in August 2020  REFERRING DIAG: I63.9 (ICD-10-CM) - Cerebrovascular accident (CVA), unspecified mechanism (HCC)   THERAPY DIAG:   Hemiplegia and hemiparesis following cerebral infarction affecting left non-dominant side (HCC)  Muscle weakness (generalized)  Other lack of coordination  Unsteadiness on feet  Other abnormalities of gait and mobility  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)  Other symptoms and signs involving the nervous system   Rationale for Evaluation and Treatment: Rehabilitation  SUBJECTIVE:                                                                                                                                                                                             SUBJECTIVE STATEMENT:  Pt states he was worn out after last therapy session and after the stroke support group. Pt states that night when he was going to get in the bed his left leg was ready to give out. Pt states he did his stretches the next morning and tried to remember everything he has learned in therapy and he was able to get back to walking. Pt states he walked up the hill in his driveway and it went good. Patient denies stumbles/falls. Denies pain.    Of note: Botox  in L LE  biceps femoris on 06/24/2024 Has the Motus Nova Foot device    Initial Eval: Patient reports his goal is to walk without using a cane and without assistance. Patient states he wants to walk like he did before having the stroke. Reports his stroke was in August of 2020. States he currently uses a quad cane for ambulation and tries to walk as much as possible. Reports he has a manual wheelchair, but states he tries not to use it because it depresses him and makes him feel like he is back to when he first had his stroke. Reports wearing L LE custom AFO at all times.   Reports he is currently doing everything mod-I, but states he could call his mom, dad, or brothers if he needed assistance.  Reports he has routine botox  injections in L UE to help manage tone, but doesn't receive any in his LE.  Reports he has a hospital bed, but sleeps in a regular bed.  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  Etiology of carotid occlusion unclear possibly dissection.  He had a prolonged hospital admission with several complications including DVT, pulmonary embolism, hemorrhagic transformation, abdominal wall hematoma, UTI but made quite remarkable recovery   PAIN:  Are you having pain? No, denies pain today, but states sometimes will have L shoulder pain  PRECAUTIONS: Fall  RED FLAGS: None   WEIGHT BEARING RESTRICTIONS: No  FALLS: Has patient fallen in last 6 months? Yes, stating he fell down his stairs ~5 months ago and is now scared to perform stair  navigation  LIVING ENVIRONMENT: Lives with: lives alone Lives in: House/apartment Stairs: Yes: Internal: flight steps; can reach both and External: 4 steps; can reach both, but states he doesn't have to go upstairs Has following equipment at home: Quad cane small base, Hemi walker, Wheelchair (manual), shower chair, Shower bench, and L LE custom AFO Pt has custom K5 wheelchair with rigid, contoured back support and Roho cushion. Pt reports he calls [employer] to come adjust his seat cushion when he feels it is getting uncomfortable.  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  DIAGNOSTIC FINDINGS:  EXAM: CT ANGIOGRAPHY HEAD AND NECK WITH AND WITHOUT CONTRAST IMPRESSION: 1. No acute intracranial abnormality. 2. Chronically occluded right ICA with minimal opacification of the right MCA and ACA branches. 3. Redemonstrated extensive encephalomalacia in the right MCA and ACA territories secondary to a prior infarct.  Electronically Signed   By: Lyndall Gore M.D.   On: 11/27/2023 13:45  COGNITION: Overall cognitive status: Within functional limits for tasks assessed  VISION (03/29/2024): Therapist performed visual field screen with pt appearing to have L homonomous hemianopsia - pt confirms he has loss of vision on L side.   SENSATION: Light touch: Impaired  in L LE Proprioception: Impaired  in L LE  Can feel deep pressure in L LE   COORDINATION: Impaired in L LE due to paresis, hypertonia, and overall decreased flexibility  EDEMA:  Not  formally assessed  MUSCLE TONE: LLE: Mild, Moderate, and Hypertonic  MUSCLE LENGTH: Not formally assessed; however, demonstrates L hamstring muscle tightness  DTRs:  Not formally assessed  POSTURE: rounded shoulders, forward head, and weight shift right  LOWER EXTREMITY ROM:       Right Eval Active ROM Left Eval Passive ROM  Hip flexion WFL Achieves at least 90  Hip extension    Hip abduction    Hip adduction    Hip internal rotation  Unable to achieve neutral hip rotation, lacking internal rotation  Hip external rotation  [facility] and rests in excessive ER  Knee flexion [facility]  Knee extension WFL Able to achieve terminal knee extension in supine, but with significant discomfort due to hamstring tone  Ankle dorsiflexion WFL Did not remove AFO to assess this date  Ankle plantarflexion WFL Did not remove AFO to assess this date  Ankle inversion    Ankle eversion     (Blank rows = not tested)  LOWER EXTREMITY MMT:    MMT Right Eval Left Eval  Hip flexion 4+ 2-  (compensates with hip adductor activation)  Hip extension    Hip abduction    Hip adduction  3+  Hip internal rotation  2-  Hip external rotation  2  Knee flexion 4+ 1  Knee extension 4+ 3+  Ankle dorsiflexion 4+ Did not remove AFO to assess this date  Ankle plantarflexion 4+ Did not remove AFO to assess this date  Ankle inversion    Ankle eversion    (Blank rows = not tested)  Manual Muscle Test Scale 0/5 = No muscle contraction can be seen or felt 1/5 = Contraction can be felt, but there is no motion 2-/5 = Part moves through incomplete ROM w/ gravity decreased 2/5 = Part moves through complete ROM w/ gravity decreased 2+/5 = Part moves through incomplete ROM (<50%) against gravity or through complete ROM w/ gravity 3-/5 = Part moves through incomplete ROM (>50%) against gravity 3/5 = Part moves through complete ROM against gravity 3+/5 = Part moves through complete ROM against gravity/slight  resistance 4-/5= Holds test position against slight to moderate pressure 4/5 = Part moves through complete ROM against gravity/moderate resistance 4+/5= Holds test position against moderate to strong pressure 5/5 = Part moves through complete ROM against gravity/full resistance  BED MOBILITY: (sleeps in regular bed, but has hospital bed available) Findings: Sit to supine CGA Supine to sit Min A and Mod A Rolling to Right Mod A Rolling to Left Min A and Mod A  TRANSFERS: Sit to stand: CGA and Min A  Assistive device utilized: Counselling psychologist     Stand to sit: CGA and Min A  Assistive device utilized: Tree surgeon to chair: Min A  Assistive device utilized: Counselling psychologist       RAMP:  Not tested  CURB:  Not tested  STAIRS: Not tested *need to assess*  GAIT: Findings: advances L LE with excessive hip external rotation using hip adductors, step to pattern, decreased arm swing- Left, decreased step length- Right, decreased stance time- Left, decreased stride length, decreased hip/knee flexion- Left, decreased ankle dorsiflexion- Left, lateral lean- Right, and decreased trunk rotation Distance walked: ~66ft  Assistive device utilized: small based quad cane in R UE  Level of assistance: CGA and Min A  FUNCTIONAL TESTS:  5 times sit to stand: need to assess Timed up and go (TUG): need to assess 6 minute walk test: need to assess 10 meter walk test: 0.58m/s using small based quad cane and CGA Berg Balance Scale: need to assess  PATIENT SURVEYS:  Stroke Impact Scale 03/15/2024: 49/80                                                                                                                               TREATMENT DATE: 07/13/2024  Gait training ~168ft into therapy clinic using SBQC with SBA - pt demos decreased step lengths today with more of a step-to pattern leading with L LE. Continues to have L LE mild to moderately excessively externally  rotated.  Pt agreeable to continue use of litegait harness for more dynamic gait training with decreased reliance on R UE support.   Gait training trials as described below (seated breaks between) in litegait harness WITHOUT BWS and therapist managing litegait. Pt demonstrating the following gait deviations with therapist providing the described cuing and facilitation for improvement:  ~318ft with goal of no more than R pointer finger support throughout (achieved ~70% of the time) Also, performed a total of ~48ft of gait training without UE support throughout At end: RPE 16/20 and HR 99bpm and SpO2 99% HR recovers to 79bpm during seated break Therapist continues facilitation of increased and prolonged L wt shift onto L stance phase  Continues to have decreased L foot clearance during swing that increases when not using R UE support, causing intermittent (2x) toe catching and minor anterior LOB L LE remains excessively externally rotated Gait speed improving and pt's endurance is improving to participate in gait training this distance 2 sessions in a row without seated rest break! Forward/Backwards gait training ~11ft x3 with R UE support on litegait handle Forward:  On last lap, had +2 A use RTB to pull pt's hips towards R to promote pt having to increase L wt shift over L stance limb with improvement noted Pt will benefit from continuation of this error augmentation technique in the future Backwards: Goal of stepping L foot back enough to get toes to/past R heel for more reciprocal pattern and pt successful at least 50% of the time today!  Pt does have to use compensatory movement patterns at hip in order to step L foot back due to lack of knee flexion activation, but improvement noted. Pt becoming fatigued at end of session. Improved control of posterior momentum, until becomes fatigued Pt continues to report this as challenging HR 96bpm and RPE 17/20 HR recovers to 86bpm in ~2  minutes   Doffed litegait harness.  Patient transported upstairs in transport chair.   PATIENT EDUCATION: Education details: Therapy POC, LTGs, HEP, importance of improving L LE flexibility, findings during assessment  Person educated: Patient  Education method: Chief Technology Officer Education comprehension: verbalized understanding and needs further education  HOME EXERCISE PROGRAM:  Access Code: [email or web address] URL: [email or web address] Date: 03/10/2024 Prepared by: Connell Kiss  Exercises - Seated Hip Adduction Isometrics with Ball  - 1 x daily - 7 x weekly - 2 sets - 10 reps - 5 seconds hold - Supine Quadricep Sets  - 1 x daily - 7 x weekly - 2 sets - 10 reps - 3 seconds hold Use of Motus Nova Foot device for at least 1hour every day  GOALS: Goals reviewed with patient? Yes  SHORT TERM GOALS: Target date: 07/13/2024  Patient will be independent in home exercise program to improve strength/mobility for better functional independence with ADLs.  Baseline: initiated on 03/10/2024 04/14/2024: continued Goal status: IN PROGRESS   LONG TERM GOALS: Target date: 08/24/2024  Patient (< 51 years old) will complete five times sit to stand (5XSTS) test in < 10 seconds indicating an increased LE strength and improved balance.  Baseline: 03/15/2024: 16.53 seconds from standard green chair relying on R hand to push-up from armrest and pt's personal SBQC nearby, but not using it VERSUS 25.85 seconds with R hand across chest, with min A for balance/safety without use of UE support 04/14/2024: 8.96 seconds using R UE support to push-up from armrest and pt's SBQC next to him, 13.75 seconds with R hand across chest with min A for therapist to stabilize chair behind him 05/30/24: 13.01 sec with R UE support on chair; 15.26 sec with R hand across chest and minA from therapist to stabilize chair behind pt Goal status: IN PROGRESS  2.  Patient will increase Berg Balance score to > 45/56 to  demonstrate improved balance and decreased fall risk during functional activities and ADLs.  Baseline: 03/15/2024: 14/56 04/14/2024: 22/56 05/30/24: TBD at next visit Goal status: IN PROGRESS  3.  Patient will increase 10 meter walk test to >1.49m/s using LRAD as to improve gait speed for better community ambulation and to reduce fall risk.  Baseline: 0.30 m/s using small based quad cane with CGA and 1x light min A due to balance instability  04/14/2024: 0.405 m/s using SBQC with close SBA for safety 05/30/24: 0.379 m/s using SBQC with close SBA for safety Goal status: IN PROGRESS  4.  Patient will increase six minute walk test distance to >538ft for progression towards community ambulator and improve gait ability  Baseline: 255ft with [facility]  04/20/2024:  276ft with SBQC and SBA 05/30/24: 360 ft with SBQC Goal status: IN PROGRESS  5.  Patient will reduce timed up and go to <15 seconds to reduce fall risk and demonstrate improved transfer/gait ability. Baseline: 03/15/2024: 25.32 seconds using SBQC and chair with armrest as well as CGA for safety/steadying 04/14/2024: 22.25 seconds using SBQC & chair w/ armrest and close SBA for safety 05/30/24: 26.87 seconds w/ SBQC and use  Goal status: IN PROGRESS  6.  Patient will ascend/descend 4 stairs, using railing, independently without loss of balance to improve ability to get in/out of home.   Baseline: 03/31/2024: using R UE support on each HR with skilled min assist for balance using primarily step-to pattern 04/20/2024:  using R UE support on each HR with skilled CGA for safety using step-to pattern 05/30/24: continued use of R UE support on HR with skilled CGA for safety using step-to pattern Goal status: IN PROGRESS  ASSESSMENT:  CLINICAL IMPRESSION:  Patient arrives motivated to participate in therapy session. Therapy session continued focus on  higher intensity gait training with use of litegait harness to decrease reliance on R UE support. Patient able  to progress to only using R UE pointer finger support for majority of gait training today and still achieve the same gait distance before needing a seated rest break, despite this increased energy demand. Patient continued backwards gait training in litegait with pt having improved reciprocal stepping pattern, although has to use compensatory movement patterns in order to step L LE back due to lack of knee flexor activation. Patient able to increase backwards gait distance today prior to fatigue. Pt will benefit from continued dynamic gait training and L LE NMR including backwards and variable direction gait training due to decreased ability to step L LE in various directions in order to maintain balance. Pt will continue to benefit from skilled therapy to address remaining deficits in order to improve overall QoL and return to PLOF.     OBJECTIVE IMPAIRMENTS: Abnormal gait, cardiopulmonary status limiting activity, decreased activity tolerance, decreased balance, decreased coordination, decreased endurance, decreased knowledge of use of DME, decreased mobility, difficulty walking, decreased ROM, decreased strength, hypomobility, impaired flexibility, impaired sensation, impaired tone, impaired UE functional use, improper body mechanics, postural dysfunction, and pain.   ACTIVITY LIMITATIONS: carrying, lifting, bending, standing, squatting, sleeping, stairs, transfers, bed mobility, continence, bathing, toileting, dressing, reach over head, hygiene/grooming, locomotion level, and caring for others  PARTICIPATION LIMITATIONS: meal prep, cleaning, laundry, medication management, shopping, community activity, and yard work  PERSONAL FACTORS: Age, Past/current experiences, Time since onset of injury/illness/exacerbation, Transportation, and 3+ comorbidities: HTN, hx of kidney stones, depression are also affecting patient's functional outcome.   REHAB POTENTIAL: Good  CLINICAL DECISION MAKING:  Evolving/moderate complexity  EVALUATION COMPLEXITY: Moderate  PLAN:  PT FREQUENCY: 1-2x/week  PT DURATION: 12 weeks  PLANNED INTERVENTIONS: 97164- PT Re-evaluation, 97750- Physical Performance Testing, 97110-Therapeutic exercises, 97530- Therapeutic activity, [id number]- Neuromuscular re-education, 97535- Self Care, [id number]- Manual therapy, [id number]- Gait training, [id number]- Orthotic/Prosthetic subsequent, [phone]- Canalith repositioning, [phone]- Electrical stimulation (manual), Patient/Family education, Balance training, Stair training, Taping, Dry Needling, Joint mobilization, Joint manipulation, Vestibular training, Visual/preceptual remediation/compensation, DME instructions, Cryotherapy, Moist heat, and Biofeedback  PLAN FOR NEXT SESSION:   *Progress Note*   Progress HEP  high intensity gait training using +2 HHA vs litegait support vs SBQC continue adding 7lb AW to L LE when appropriate vs decreasing RUE support/reliance while in litegait Backwards gait training - continue Stepping over obstacles - continue Continue stair training for L LE NMR For stance   R foot taps while sustaining L hip extension For swing phase Reciprocal foot taps L LE stretching and weightbearing for tone management and to improve alignment during gait/standing (avoid excessive hip ER) Non gait training ideas: bridges with R foot placed on unstable surface to promote increased L LE wt bearing - BOTOX  appointment 06/24/24   Connell Kiss, PT, DPT, NCS, CSRS Physical Therapist - [facility]  [facility]  7:37 PM 07/13/24

## 2024-07-18 ENCOUNTER — Ambulatory Visit: Admitting: Physical Therapy

## 2024-07-19 ENCOUNTER — Ambulatory Visit: Admitting: Physical Therapy

## 2024-07-21 ENCOUNTER — Ambulatory Visit

## 2024-07-21 DIAGNOSIS — R2681 Unsteadiness on feet: Secondary | ICD-10-CM | POA: Diagnosis not present

## 2024-07-21 DIAGNOSIS — R29818 Other symptoms and signs involving the nervous system: Secondary | ICD-10-CM

## 2024-07-21 DIAGNOSIS — I69354 Hemiplegia and hemiparesis following cerebral infarction affecting left non-dominant side: Secondary | ICD-10-CM | POA: Diagnosis not present

## 2024-07-21 DIAGNOSIS — M6281 Muscle weakness (generalized): Secondary | ICD-10-CM

## 2024-07-21 DIAGNOSIS — R278 Other lack of coordination: Secondary | ICD-10-CM

## 2024-07-21 DIAGNOSIS — R2689 Other abnormalities of gait and mobility: Secondary | ICD-10-CM | POA: Diagnosis not present

## 2024-07-21 DIAGNOSIS — R252 Cramp and spasm: Secondary | ICD-10-CM | POA: Diagnosis not present

## 2024-07-21 NOTE — Therapy (Signed)
 OUTPATIENT PHYSICAL THERAPY NEURO TREATMENT/Physical Therapy Progress Note   Dates of reporting period  05/30/2024   to   07/21/2024    Patient Name: Matthew Black MRN: [id number] DOB:29-Oct-1976, 48 y.o., male Today's Date: 07/21/2024   PCP: Dorina Loving, PA-C REFERRING PROVIDER: Lorilee Sven SQUIBB, MD  END OF SESSION:   PT End of Session - 07/21/24 1535     Visit Number 30    Number of Visits 48    Date for PT Re-Evaluation 08/24/24    Authorization Type [employer] auth: [id number] for 16 pt vst from 8/12-10/07 (8/20 is 2 of 16)    PT Start Time 1534    PT Stop Time 1614    PT Time Calculation (min) 40 min    Equipment Utilized During Treatment Gait belt;Other (comment)   L LE AFO   Activity Tolerance Patient tolerated treatment well    Behavior During Therapy [facility] for tasks assessed/performed             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   Stroke (HCC)    RICA, R A1, R MCA occlusion 07/19/19   Past Surgical History:  Procedure Laterality Date   CRANIOPLASTY Right 06/05/2020   Procedure: CRANIOPLASTY;  Surgeon: Lanis Pupa, MD;  Location: [facility] OR;  Service: Neurosurgery;  Laterality: Right;  right   CRANIOTOMY Right 07/19/2019   Procedure: RIGHT HEMI-CRANIECTOMY With implantation of skull flap to abdominal wall;  Surgeon: Lanis Pupa, MD;  Location: [facility] OR;  Service: Neurosurgery;  Laterality: Right;   CYST EXCISION N/A 10/08/2016   Procedure: EXCISION OF POSTERIOR NECK CYST;  Surgeon: Mitzie DELENA Freund, MD;  Location: [facility] ORS;  Service: General;  Laterality:  N/A;   CYSTOSCOPY/URETEROSCOPY/HOLMIUM LASER/STENT PLACEMENT Right 03/16/2020   Procedure: CYSTOSCOPY RIGHT RETROGRADE PYELOGRAM URETEROSCOPY/HOLMIUM LASER/STENT PLACEMENT;  Surgeon: Carolee Sherwood JONETTA DOUGLAS, MD;  Location: [facility];  Service: Urology;  Laterality: Right;   INCISION AND DRAINAGE ABSCESS N/A 09/22/2014   Procedure: INCISION AND DRAINAGE ABSCESS POSTERIOR NECK;  Surgeon: Donnice KATHEE Lunger, MD;  Location: [facility];  Service: General;  Laterality: N/A;   INCISION AND DRAINAGE ABSCESS N/A 12/20/2015   Procedure: INCISION AND DRAINAGE POSTERIOR NECK MASS;  Surgeon: Krystal Spinner, MD;  Location: [facility];  Service: General;  Laterality: N/A;   INCISION AND DRAINAGE ABSCESS Left 07/10/2004   middle finger   IR IVC FILTER PLMT / S&I /IMG GUID/MOD SED  07/21/2019   IR RADIOLOGIST EVAL & MGMT  12/14/2019   IR RADIOLOGIST EVAL & MGMT  12/21/2020   IR VENOGRAM RENAL UNI RIGHT  07/21/2019   MASS EXCISION N/A 07/21/2017   Procedure: EXCISION OF BENIGN NECK LESION WITH LAYERED CLOSURE;  Surgeon: Arelia Filippo, MD;  Location: [facility];  Service: Plastics;  Laterality: N/A;   MASS EXCISION N/A 11/10/2017   Procedure: EXCISION BENIGN LESION OF THE NECK WITH LAYERED CLOSURE;  Surgeon: Arelia Filippo, MD;  Location: [facility];  Service: Plastics;  Laterality: N/A;   Patient Active Problem List  Diagnosis Date Noted   Hx of adenomatous colonic polyps 06/01/2023   COVID-19 virus infection 04/21/2021   Left-sided weakness 04/21/2021   S/P craniotomy 06/05/2020   History of cranioplasty 06/05/2020   Peri-rectal abscess 02/14/2020   Abnormal CT scan, pelvis 02/14/2020   Pancytopenia (HCC) 12/02/2019   Nephrolithiasis 12/02/2019   Hydronephrosis with renal and ureteral calculus obstruction 12/02/2019   Rectal pain 12/02/2019   Hyperkalemia 12/02/2019   Reactive depression    Wound infection after surgery    Sleep disturbance    Dysphagia, post-stroke    Transaminitis     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   Cerebral abscess    Urinary tract infection without hematuria    Altered mental status    Primary hypercoagulable state (HCC)    Acute pulmonary embolism without acute cor pulmonale (HCC)    Deep vein thrombosis (DVT) of non-extremity vein    Hypokalemia    Acute blood loss anemia    Leukocytosis    Endotracheal tube present    Acute respiratory failure with hypoxemia (HCC)    Stroke (cerebrum) (HCC) 07/19/2019   Pressure injury of skin 07/19/2019   Acute CVA (cerebrovascular accident) (HCC)    Encephalopathy    Dysphagia    Acute encephalopathy    Essential hypertension    Obesity 03/12/2016   Scalp abscess 12/20/2015   Neck abscess 12/20/2015   Pilonidal cyst 02/08/2013    ONSET DATE: stroke in August 2020  REFERRING DIAG: I63.9 (ICD-10-CM) - Cerebrovascular accident (CVA), unspecified mechanism (HCC)   THERAPY DIAG:   Hemiplegia and hemiparesis following cerebral infarction affecting left non-dominant side (HCC)  Muscle weakness (generalized)  Other lack of coordination  Unsteadiness on feet  Other abnormalities of gait and mobility  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)  Other symptoms and signs involving the nervous system   Rationale for Evaluation and Treatment: Rehabilitation  SUBJECTIVE:                                                                                                                                                                                             SUBJECTIVE STATEMENT:  Pt states not feeling well today - states he thinks he slept poorly and complaining of left shoulder pain today.    Of note: Botox  in L LE biceps femoris on 06/24/2024 Has the Motus Nova Foot device    Initial Eval: Patient reports his goal is to walk without using a cane and without assistance. Patient states he wants to walk like he did before having the  stroke. Reports his  stroke was in August of 2020. States he currently uses a quad cane for ambulation and tries to walk as much as possible. Reports he has a manual wheelchair, but states he tries not to use it because it depresses him and makes him feel like he is back to when he first had his stroke. Reports wearing L LE custom AFO at all times.   Reports he is currently doing everything mod-I, but states he could call his mom, dad, or brothers if he needed assistance.  Reports he has routine botox  injections in L UE to help manage tone, but doesn't receive any in his LE.  Reports he has a hospital bed, but sleeps in a regular bed.  Pt accompanied by: self  PERTINENT HISTORY: L hemiparesis s/p R ACA and MCA territory CVA in August 2020, HTN, hx of kidney stones, depression  Per Neurology MD note on 11/24/2023: 55-year-old African-American male with large right hemispheric infarct due to right internal carotid and middle cerebral artery occlusion with cytotoxic edema and brain herniation s/p hemicraniectomy in 06/2019 is doing reasonably well with residual left spastic hemiparesis and left hemianopia.  Etiology of carotid occlusion unclear possibly dissection.  He had a prolonged hospital admission with several complications including DVT, pulmonary embolism, hemorrhagic transformation, abdominal wall hematoma, UTI but made quite remarkable recovery   PAIN:  Are you having pain? No, denies pain today, but states sometimes will have L shoulder pain  PRECAUTIONS: Fall  RED FLAGS: None   WEIGHT BEARING RESTRICTIONS: No  FALLS: Has patient fallen in last 6 months? Yes, stating he fell down his stairs ~5 months ago and is now scared to perform stair navigation  LIVING ENVIRONMENT: Lives with: lives alone Lives in: House/apartment Stairs: Yes: Internal: flight steps; can reach both and External: 4 steps; can reach both, but states he doesn't have to go upstairs Has following equipment at home:  Quad cane small base, Hemi walker, Wheelchair (manual), shower chair, Shower bench, and L LE custom AFO Pt has custom K5 wheelchair with rigid, contoured back support and Roho cushion. Pt reports he calls [employer] to come adjust his seat cushion when he feels it is getting uncomfortable.  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  DIAGNOSTIC FINDINGS:  EXAM: CT ANGIOGRAPHY HEAD AND NECK WITH AND WITHOUT CONTRAST IMPRESSION: 1. No acute intracranial abnormality. 2. Chronically occluded right ICA with minimal opacification of the right MCA and ACA branches. 3. Redemonstrated extensive encephalomalacia in the right MCA and ACA territories secondary to a prior infarct.  Electronically Signed   By: Lyndall Gore M.D.   On: 11/27/2023 13:45  COGNITION: Overall cognitive status: Within functional limits for tasks assessed  VISION (03/29/2024): Therapist performed visual field screen with pt appearing to have L homonomous hemianopsia - pt confirms he has loss of vision on L side.   SENSATION: Light touch: Impaired in L LE Proprioception: Impaired  in L LE  Can feel deep pressure in L LE   COORDINATION: Impaired in L LE due to paresis, hypertonia, and overall decreased flexibility  EDEMA:  Not formally assessed  MUSCLE TONE: LLE: Mild, Moderate,  and Hypertonic  MUSCLE LENGTH: Not formally assessed; however, demonstrates L hamstring muscle tightness  DTRs:  Not formally assessed  POSTURE: rounded shoulders, forward head, and weight  shift right  LOWER EXTREMITY ROM:       Right Eval Active ROM Left Eval Passive ROM  Hip flexion WFL Achieves at least 90  Hip extension    Hip abduction    Hip adduction    Hip internal rotation  Unable to achieve neutral hip rotation, lacking internal rotation  Hip external rotation  [facility] and rests in excessive ER  Knee flexion [facility]  Knee extension WFL Able to achieve terminal knee extension in supine, but with significant discomfort due to hamstring tone  Ankle dorsiflexion WFL Did not remove AFO to assess this date  Ankle plantarflexion WFL Did not remove AFO to assess this date  Ankle inversion    Ankle eversion     (Blank rows = not tested)  LOWER EXTREMITY MMT:    MMT Right Eval Left Eval  Hip flexion 4+ 2-  (compensates with hip adductor activation)  Hip extension    Hip abduction    Hip adduction  3+  Hip internal rotation  2-  Hip external rotation  2  Knee flexion 4+ 1  Knee extension 4+ 3+  Ankle dorsiflexion 4+ Did not remove AFO to assess this date  Ankle plantarflexion 4+ Did not remove AFO to assess this date  Ankle inversion    Ankle eversion    (Blank rows = not tested)  Manual Muscle Test Scale 0/5 = No muscle contraction can be seen or felt 1/5 = Contraction can be felt, but there is no motion 2-/5 = Part moves through incomplete ROM w/ gravity decreased 2/5 = Part moves through complete ROM w/ gravity decreased 2+/5 = Part moves through incomplete ROM (<50%) against gravity or through complete ROM w/ gravity 3-/5 = Part moves through incomplete ROM (>50%) against gravity 3/5 = Part moves through complete ROM against gravity 3+/5 = Part moves through complete ROM against gravity/slight resistance 4-/5= Holds test position against slight to moderate pressure 4/5 = Part moves through complete ROM against gravity/moderate resistance 4+/5= Holds test position against moderate to strong pressure 5/5 = Part moves through complete ROM against  gravity/full resistance  BED MOBILITY: (sleeps in regular bed, but has hospital bed available) Findings: Sit to supine CGA Supine to sit Min A and Mod A Rolling to Right Mod A Rolling to Left Min A and Mod A  TRANSFERS: Sit to stand: CGA and Min A  Assistive device utilized: Counselling psychologist     Stand to sit: CGA and Min A  Assistive device utilized: Tree surgeon to chair: Min A  Assistive device utilized: Counselling psychologist       RAMP:  Not tested  CURB:  Not tested  STAIRS: Not tested *need to assess*  GAIT: Findings: advances L LE with excessive hip external rotation using hip adductors, step to pattern, decreased arm swing- Left, decreased step length- Right, decreased stance time- Left, decreased stride length, decreased hip/knee flexion- Left, decreased ankle dorsiflexion- Left, lateral lean- Right, and decreased trunk rotation Distance walked: ~30ft Assistive device utilized: small based quad cane in R UE  Level of assistance: CGA and Min A  FUNCTIONAL TESTS:  5 times sit to stand: need to assess Timed up and go (TUG): need to assess 6 minute walk test: need to  assess 10 meter walk test: 0.73m/s using small based quad cane and CGA Berg Balance Scale: need to assess  PATIENT SURVEYS:  Stroke Impact Scale 03/15/2024: 49/80                                                                                                                               TREATMENT DATE: 07/21/2024   Physical therapy treatment session today consisted of completing assessment of goals and administration of testing as demonstrated and documented in flow sheet, treatment, and goals section of this note. Addition treatments may be found below.   PT instructed pt in TUG: 21.13 sec with SBQC and SBA sec (average of 3 trials; >13.5 sec indicates increased fall risk)    10 Meter Walk Test: Patient instructed to walk 10 meters (32.8 ft) as quickly and as safely as possible at  their normal speed x2 and at a fast speed x2. Time measured from 2 meter mark to 8 meter mark to accommodate ramp-up and ramp-down.  Normal speed 1: 0.44 m/s with SBQC with close SBA Normal speed 2: 0.42 m/s with SBQC with close SBA Average Normal speed: 0.43 m/s Cut off scores: <0.4 m/s = household Ambulator, 0.4-0.8 m/s = limited community Ambulator, >0.8 m/s = community Ambulator, >1.2 m/s = crossing a street, <1.0 = increased fall risk MCID 0.05 m/s (small), 0.13 m/s (moderate), 0.06 m/s (significant)  (ANPTA Core Set of Outcome Measures for Adults with Neurologic Conditions, 2018)   Gait training ~167ft into therapy clinic using SBQC with SBA - pt demos decreased step lengths today with more of a step-to pattern leading with L LE. Continues to have L LE mild to moderately excessively externally rotated.  Gait training: 2 point gait vs. 3 point- 80 feet attempted 2 point gait- patient with increased difficulty sequencing  and some delay in processing.     PATIENT EDUCATION: Education details: Therapy POC, LTGs, HEP, importance of improving L LE flexibility, findings during assessment  Person educated: Patient Education method: Explanation and Handouts Education comprehension: verbalized understanding and needs further education  HOME EXERCISE PROGRAM:  Access Code: [email or web address] URL: [email or web address] Date: 03/10/2024 Prepared by: Connell Kiss  Exercises - Seated Hip Adduction Isometrics with Ball  - 1 x daily - 7 x weekly - 2 sets - 10 reps - 5 seconds hold - Supine Quadricep Sets  - 1 x daily - 7 x weekly - 2 sets - 10 reps - 3 seconds hold Use of Motus Nova Foot device for at least 1hour every day  GOALS: Goals reviewed with patient? Yes  SHORT TERM GOALS: Target date: 07/13/2024  Patient will be independent in home exercise program to improve strength/mobility for better functional independence with ADLs.  Baseline: initiated on 03/10/2024 04/14/2024:  continued Goal status: IN PROGRESS   LONG TERM GOALS: Target date: 08/24/2024  Patient (< 67 years old) will complete five times sit to stand (5XSTS) test in < 10 seconds  indicating an increased LE strength and improved balance.  Baseline: 03/15/2024: 16.53 seconds from standard green chair relying on R hand to push-up from armrest and pt's personal SBQC nearby, but not using it VERSUS 25.85 seconds with R hand across chest, with min A for balance/safety without use of UE support 04/14/2024: 8.96 seconds using R UE support to push-up from armrest and pt's SBQC next to him, 13.75 seconds with R hand across chest with min A for therapist to stabilize chair behind him 05/30/24: 13.01 sec with R UE support on chair; 15.26 sec with R hand across chest and minA from therapist to stabilize chair behind pt Goal status: IN PROGRESS  2.  Patient will increase Berg Balance score to > 45/56 to demonstrate improved balance and decreased fall risk during functional activities and ADLs.  Baseline: 03/15/2024: 14/56 04/14/2024: 22/56 05/30/24: TBD at next visit Goal status: IN PROGRESS  3.  Patient will increase 10 meter walk test to >1.46m/s using LRAD as to improve gait speed for better community ambulation and to reduce fall risk.  Baseline: 0.30 m/s using small based quad cane with CGA and 1x light min A due to balance instability  04/14/2024: 0.405 m/s using SBQC with close SBA for safety 05/30/24: 0.379 m/s using SBQC with close SBA for safety.  07/21/2024= 0.43 m/s with SBQC with close SBA Goal status: IN PROGRESS  4.  Patient will increase six minute walk test distance to >566ft for progression towards community ambulator and improve gait ability  Baseline: 286ft with [facility]  04/20/2024:  234ft with SBQC and SBA 05/30/24: 360 ft with SBQC Goal status: IN PROGRESS  5.  Patient will reduce timed up and go to <15 seconds to reduce fall risk and demonstrate improved transfer/gait ability. Baseline: 03/15/2024: 25.32  seconds using SBQC and chair with armrest as well as CGA for safety/steadying 04/14/2024: 22.25 seconds using SBQC & chair w/ armrest and close SBA for safety 05/30/24: 26.87 seconds w/ SBQC and use; 07/21/2024= 21.13 sec with SBQC and SBA Goal status: IN PROGRESS  6.  Patient will ascend/descend 4 stairs, using railing, independently without loss of balance to improve ability to get in/out of home.   Baseline: 03/31/2024: using R UE support on each HR with skilled min assist for balance using primarily step-to pattern 04/20/2024:  using R UE support on each HR with skilled CGA for safety using step-to pattern 05/30/24: continued use of R UE support on HR with skilled CGA for safety using step-to pattern Goal status: IN PROGRESS  ASSESSMENT:  CLINICAL IMPRESSION:  Treatment limited for progress note as patient reporting not feeling well with L shoulder pain. He requested to hold many tests due to not feeling well but did agree to try to perform several rounds of walking - including 10 MWT and TUG. He demonstrated good progress with both of these long term goals despite report of not feeling well. He demonstrated improved overall gait speed  and ability to turn without losing his balance. Later in session he was agreeable to try a new gait sequence- 2 point gait yet exhibited increased difficulty with sequencing and will benefit from continued practice. Patient's condition has the potential to improve in response to therapy. Maximum improvement is yet to be obtained. The anticipated improvement is attainable and reasonable in a generally predictable time.  Pt will benefit from continued dynamic gait training and L LE NMR including backwards and variable direction gait training due to decreased ability to step L LE  in various directions in order to maintain balance. Pt will continue to benefit from skilled therapy to address remaining deficits in order to improve overall QoL and return to PLOF.     OBJECTIVE  IMPAIRMENTS: Abnormal gait, cardiopulmonary status limiting activity, decreased activity tolerance, decreased balance, decreased coordination, decreased endurance, decreased knowledge of use of DME, decreased mobility, difficulty walking, decreased ROM, decreased strength, hypomobility, impaired flexibility, impaired sensation, impaired tone, impaired UE functional use, improper body mechanics, postural dysfunction, and pain.   ACTIVITY LIMITATIONS: carrying, lifting, bending, standing, squatting, sleeping, stairs, transfers, bed mobility, continence, bathing, toileting, dressing, reach over head, hygiene/grooming, locomotion level, and caring for others  PARTICIPATION LIMITATIONS: meal prep, cleaning, laundry, medication management, shopping, community activity, and yard work  PERSONAL FACTORS: Age, Past/current experiences, Time since onset of injury/illness/exacerbation, Transportation, and 3+ comorbidities: HTN, hx of kidney stones, depression are also affecting patient's functional outcome.   REHAB POTENTIAL: Good  CLINICAL DECISION MAKING: Evolving/moderate complexity  EVALUATION COMPLEXITY: Moderate  PLAN:  PT FREQUENCY: 1-2x/week  PT DURATION: 12 weeks  PLANNED INTERVENTIONS: 97164- PT Re-evaluation, 97750- Physical Performance Testing, 97110-Therapeutic exercises, 97530- Therapeutic activity, [id number]- Neuromuscular re-education, 97535- Self Care, [id number]- Manual therapy, [phone]- Gait training, [phone]- Orthotic/Prosthetic subsequent, [phone]- Canalith repositioning, [phone]- Electrical stimulation (manual), Patient/Family education, Balance training, Stair training, Taping, Dry Needling, Joint mobilization, Joint manipulation, Vestibular training, Visual/preceptual remediation/compensation, DME instructions, Cryotherapy, Moist heat, and Biofeedback  PLAN FOR NEXT SESSION:     Progress HEP  high intensity gait training using +2 HHA vs litegait support vs SBQC continue adding 7lb AW to L LE  when appropriate vs decreasing RUE support/reliance while in litegait Backwards gait training - continue Stepping over obstacles - continue Continue stair training for L LE NMR For stance   R foot taps while sustaining L hip extension For swing phase Reciprocal foot taps L LE stretching and weightbearing for tone management and to improve alignment during gait/standing (avoid excessive hip ER) Non gait training ideas: bridges with R foot placed on unstable surface to promote increased L LE wt bearing - BOTOX  appointment 06/24/24   Chyrl London, PT Physical Therapist - [place]  [facility]  5:59 PM 07/21/24

## 2024-07-27 ENCOUNTER — Ambulatory Visit: Attending: Physical Medicine and Rehabilitation

## 2024-07-27 DIAGNOSIS — M6281 Muscle weakness (generalized): Secondary | ICD-10-CM | POA: Diagnosis not present

## 2024-07-27 DIAGNOSIS — R2689 Other abnormalities of gait and mobility: Secondary | ICD-10-CM | POA: Insufficient documentation

## 2024-07-27 DIAGNOSIS — I69354 Hemiplegia and hemiparesis following cerebral infarction affecting left non-dominant side: Secondary | ICD-10-CM | POA: Insufficient documentation

## 2024-07-27 DIAGNOSIS — R29818 Other symptoms and signs involving the nervous system: Secondary | ICD-10-CM | POA: Diagnosis not present

## 2024-07-27 DIAGNOSIS — R2681 Unsteadiness on feet: Secondary | ICD-10-CM | POA: Diagnosis not present

## 2024-07-27 DIAGNOSIS — R278 Other lack of coordination: Secondary | ICD-10-CM | POA: Insufficient documentation

## 2024-07-27 NOTE — Therapy (Signed)
 OUTPATIENT PHYSICAL THERAPY NEURO TREATMENT   Patient Name: Matthew Black MRN: [id number] DOB:03-Dec-1975, 48 y.o., male Today's Date: 07/28/2024   PCP: Dorina Loving, PA-C REFERRING PROVIDER: Lorilee Sven SQUIBB, MD  END OF SESSION:   PT End of Session - 07/27/24 1328     Visit Number 31    Number of Visits 48    Date for PT Re-Evaluation 08/24/24    Authorization Type [employer] auth: [id number] for 16 pt vst from 8/12-10/07 (8/20 is 2 of 16)    PT Start Time 1319    PT Stop Time 1359    PT Time Calculation (min) 40 min    Equipment Utilized During Treatment Gait belt;Other (comment)   L LE AFO   Activity Tolerance Patient tolerated treatment well    Behavior During Therapy [facility] for tasks assessed/performed              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   Stroke (HCC)    RICA, R A1, R MCA occlusion 07/19/19   Past Surgical History:  Procedure Laterality Date   CRANIOPLASTY Right 06/05/2020   Procedure: CRANIOPLASTY;  Surgeon: Lanis Pupa, MD;  Location: [facility] OR;  Service: Neurosurgery;  Laterality: Right;  right   CRANIOTOMY Right 07/19/2019   Procedure: RIGHT HEMI-CRANIECTOMY With implantation of skull flap to abdominal wall;  Surgeon: Lanis Pupa, MD;  Location: [facility] OR;  Service: Neurosurgery;  Laterality: Right;   CYST EXCISION N/A 10/08/2016   Procedure: EXCISION OF POSTERIOR NECK CYST;  Surgeon: Mitzie DELENA Freund, MD;  Location: [facility] ORS;  Service: General;  Laterality: N/A;   CYSTOSCOPY/URETEROSCOPY/HOLMIUM LASER/STENT PLACEMENT Right 03/16/2020    Procedure: CYSTOSCOPY RIGHT RETROGRADE PYELOGRAM URETEROSCOPY/HOLMIUM LASER/STENT PLACEMENT;  Surgeon: Carolee Sherwood JONETTA DOUGLAS, MD;  Location: [facility] ORS;  Service: Urology;  Laterality: Right;   INCISION AND DRAINAGE ABSCESS N/A 09/22/2014   Procedure: INCISION AND DRAINAGE ABSCESS POSTERIOR NECK;  Surgeon: Donnice KATHEE Lunger, MD;  Location: [facility]S;  Service: General;  Laterality: N/A;   INCISION AND DRAINAGE ABSCESS N/A 12/20/2015   Procedure: INCISION AND DRAINAGE POSTERIOR NECK MASS;  Surgeon: Krystal Spinner, MD;  Location: [facility]S;  Service: General;  Laterality: N/A;   INCISION AND DRAINAGE ABSCESS Left 07/10/2004   middle finger   IR IVC FILTER PLMT / S&I /IMG GUID/MOD SED  07/21/2019   IR RADIOLOGIST EVAL & MGMT  12/14/2019   IR RADIOLOGIST EVAL & MGMT  12/21/2020   IR VENOGRAM RENAL UNI RIGHT  07/21/2019   MASS EXCISION N/A 07/21/2017   Procedure: EXCISION OF BENIGN NECK LESION WITH LAYERED CLOSURE;  Surgeon: Arelia Filippo, MD;  Location: [facility];  Service: Plastics;  Laterality: N/A;   MASS EXCISION N/A 11/10/2017   Procedure: EXCISION BENIGN LESION OF THE NECK WITH LAYERED CLOSURE;  Surgeon: Arelia Filippo, MD;  Location: [facility];  Service: Plastics;  Laterality: N/A;   Patient Active Problem List   Diagnosis Date Noted   Hx of adenomatous colonic polyps 06/01/2023   COVID-19 virus infection  04/21/2021   Left-sided weakness 04/21/2021   S/P craniotomy 06/05/2020   History of cranioplasty 06/05/2020   Peri-rectal abscess 02/14/2020   Abnormal CT scan, pelvis 02/14/2020   Pancytopenia (HCC) 12/02/2019   Nephrolithiasis 12/02/2019   Hydronephrosis with renal and ureteral calculus obstruction 12/02/2019   Rectal pain 12/02/2019   Hyperkalemia 12/02/2019   Reactive depression    Wound infection after surgery    Sleep disturbance    Dysphagia, post-stroke    Transaminitis    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   Cerebral abscess     Urinary tract infection without hematuria    Altered mental status    Primary hypercoagulable state (HCC)    Acute pulmonary embolism without acute cor pulmonale (HCC)    Deep vein thrombosis (DVT) of non-extremity vein    Hypokalemia    Acute blood loss anemia    Leukocytosis    Endotracheal tube present    Acute respiratory failure with hypoxemia (HCC)    Stroke (cerebrum) (HCC) 07/19/2019   Pressure injury of skin 07/19/2019   Acute CVA (cerebrovascular accident) (HCC)    Encephalopathy    Dysphagia    Acute encephalopathy    Essential hypertension    Obesity 03/12/2016   Scalp abscess 12/20/2015   Neck abscess 12/20/2015   Pilonidal cyst 02/08/2013    ONSET DATE: stroke in August 2020  REFERRING DIAG: I63.9 (ICD-10-CM) - Cerebrovascular accident (CVA), unspecified mechanism (HCC)   THERAPY DIAG:   Muscle weakness (generalized)  Hemiplegia and hemiparesis following cerebral infarction affecting left non-dominant side (HCC)  Other lack of coordination  Unsteadiness on feet  Other abnormalities of gait and mobility  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)  Other symptoms and signs involving the nervous system   Rationale for Evaluation and Treatment: Rehabilitation  SUBJECTIVE:                                                                                                                                                                                             SUBJECTIVE STATEMENT:  Pt states doing okay and report feeling better with no further shoulder pain.  Of note: Botox  in L LE biceps femoris on 06/24/2024 Has the Motus Nova Foot device    Initial Eval: Patient reports his goal is to walk without using a cane and without assistance. Patient states he wants to walk like he did before having the stroke. Reports his stroke was in August of 2020. States he currently uses a quad cane for ambulation and tries to  walk as much as possible. Reports he  has a manual wheelchair, but states he tries not to use it because it depresses him and makes him feel like he is back to when he first had his stroke. Reports wearing L LE custom AFO at all times.   Reports he is currently doing everything mod-I, but states he could call his mom, dad, or brothers if he needed assistance.  Reports he has routine botox  injections in L UE to help manage tone, but doesn't receive any in his LE.  Reports he has a hospital bed, but sleeps in a regular bed.  Pt accompanied by: self  PERTINENT HISTORY: L hemiparesis s/p R ACA and MCA territory CVA in August 2020, HTN, hx of kidney stones, depression  Per Neurology MD note on 11/24/2023: 84-year-old African-American male with large right hemispheric infarct due to right internal carotid and middle cerebral artery occlusion with cytotoxic edema and brain herniation s/p hemicraniectomy in 06/2019 is doing reasonably well with residual left spastic hemiparesis and left hemianopia.  Etiology of carotid occlusion unclear possibly dissection.  He had a prolonged hospital admission with several complications including DVT, pulmonary embolism, hemorrhagic transformation, abdominal wall hematoma, UTI but made quite remarkable recovery   PAIN:  Are you having pain? No, denies pain today, but states sometimes will have L shoulder pain  PRECAUTIONS: Fall  RED FLAGS: None   WEIGHT BEARING RESTRICTIONS: No  FALLS: Has patient fallen in last 6 months? Yes, stating he fell down his stairs ~5 months ago and is now scared to perform stair navigation  LIVING ENVIRONMENT: Lives with: lives alone Lives in: House/apartment Stairs: Yes: Internal: flight steps; can reach both and External: 4 steps; can reach both, but states he doesn't have to go upstairs Has following equipment at home: Quad cane small base, Hemi walker, Wheelchair (manual), shower chair, Shower bench, and L LE custom AFO Pt has  custom K5 wheelchair with rigid, contoured back support and Roho cushion. Pt reports he calls [employer] to come adjust his seat cushion when he feels it is getting uncomfortable.  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  DIAGNOSTIC FINDINGS:  EXAM: CT ANGIOGRAPHY HEAD AND NECK WITH AND WITHOUT CONTRAST IMPRESSION: 1. No acute intracranial abnormality. 2. Chronically occluded right ICA with minimal opacification of the right MCA and ACA branches. 3. Redemonstrated extensive encephalomalacia in the right MCA and ACA territories secondary to a prior infarct.  Electronically Signed   By: Lyndall Gore M.D.   On: 11/27/2023 13:45  COGNITION: Overall cognitive status: Within functional limits for tasks assessed  VISION (03/29/2024): Therapist performed visual field screen with pt appearing to have L homonomous hemianopsia - pt confirms he has loss of vision on L side.   SENSATION: Light touch: Impaired in L LE Proprioception: Impaired  in L LE  Can feel deep pressure in L LE   COORDINATION: Impaired in L LE due to paresis, hypertonia, and overall decreased flexibility  EDEMA:  Not formally assessed  MUSCLE TONE: LLE: Mild, Moderate, and Hypertonic  MUSCLE LENGTH: Not formally assessed; however, demonstrates L hamstring muscle tightness  DTRs:  Not formally assessed  POSTURE: rounded shoulders, forward  head, and weight shift right  LOWER EXTREMITY ROM:       Right Eval Active ROM Left Eval Passive ROM  Hip flexion WFL  Achieves at least 90  Hip extension    Hip abduction    Hip adduction    Hip internal rotation  Unable to achieve neutral hip rotation, lacking internal rotation  Hip external rotation  [facility] and rests in excessive ER  Knee flexion [facility]  Knee extension WFL Able to achieve terminal knee extension in supine, but with significant discomfort due to hamstring tone  Ankle dorsiflexion WFL Did not remove AFO to assess this date  Ankle plantarflexion WFL Did not remove AFO to assess this date  Ankle inversion    Ankle eversion     (Blank rows = not tested)  LOWER EXTREMITY MMT:    MMT Right Eval Left Eval  Hip flexion 4+ 2-  (compensates with hip adductor activation)  Hip extension    Hip abduction    Hip adduction  3+  Hip internal rotation  2-  Hip external rotation  2  Knee flexion 4+ 1  Knee extension 4+ 3+  Ankle dorsiflexion 4+ Did not remove AFO to assess this date  Ankle plantarflexion 4+ Did not remove AFO to assess this date  Ankle inversion    Ankle eversion    (Blank rows = not tested)  Manual Muscle Test Scale 0/5 = No muscle contraction can be seen or felt 1/5 = Contraction can be felt, but there is no motion 2-/5 = Part moves through incomplete ROM w/ gravity decreased 2/5 = Part moves through complete ROM w/ gravity decreased 2+/5 = Part moves through incomplete ROM (<50%) against gravity or through complete ROM w/ gravity 3-/5 = Part moves through incomplete ROM (>50%) against gravity 3/5 = Part moves through complete ROM against gravity 3+/5 = Part moves through complete ROM against gravity/slight resistance 4-/5= Holds test position against slight to moderate pressure 4/5 = Part moves through complete ROM against gravity/moderate resistance 4+/5= Holds test position against moderate to strong pressure 5/5 = Part moves through complete ROM against gravity/full resistance  BED MOBILITY: (sleeps in regular bed, but has hospital bed available) Findings: Sit to  supine CGA Supine to sit Min A and Mod A Rolling to Right Mod A Rolling to Left Min A and Mod A  TRANSFERS: Sit to stand: CGA and Min A  Assistive device utilized: Counselling psychologist     Stand to sit: CGA and Min A  Assistive device utilized: Tree surgeon to chair: Min A  Assistive device utilized: Counselling psychologist       RAMP:  Not tested  CURB:  Not tested  STAIRS: Not tested *need to assess*  GAIT: Findings: advances L LE with excessive hip external rotation using hip adductors, step to pattern, decreased arm swing- Left, decreased step length- Right, decreased stance time- Left, decreased stride length, decreased hip/knee flexion- Left, decreased ankle dorsiflexion- Left, lateral lean- Right, and decreased trunk rotation Distance walked: ~63ft Assistive device utilized: small based quad cane in R UE  Level of assistance: CGA and Min A  FUNCTIONAL TESTS:  5 times sit to stand: need to assess Timed up and go (TUG): need to assess 6 minute walk test: need to assess 10 meter walk test: 0.7m/s using small based quad cane and CGA Berg Balance Scale: need to assess  PATIENT SURVEYS:  Stroke Impact  Scale 03/15/2024: 49/80                                                                                                                               TREATMENT DATE: 07/28/2024   Physical therapy treatment session today consisted of completing assessment of goals and administration of testing as demonstrated and documented in flow sheet, treatment, and goals section of this note. Addition treatments may be found below.    [facility] PT Assessment - 07/27/24 1336       Berg Balance Test   Sit to Stand Able to stand without using hands and stabilize independently    Standing Unsupported Able to stand safely 2 minutes    Sitting with Back Unsupported but Feet Supported on Floor or Stool Able to sit safely and securely 2 minutes    Stand to Sit Controls descent by  using hands    Transfers Able to transfer safely, definite need of hands    Standing Unsupported with Eyes Closed Able to stand 10 seconds with supervision    Standing Unsupported with Feet Together Able to place feet together independently and stand for 1 minute with supervision    From Standing, Reach Forward with Outstretched Arm Can reach forward >5 cm safely (2)    From Standing Position, Pick up Object from Floor Able to pick up shoe, needs supervision    From Standing Position, Turn to Look Behind Over each Shoulder Turn sideways only but maintains balance    Turn 360 Degrees Needs assistance while turning    Standing Unsupported, Alternately Place Feet on Step/Stool Able to complete >2 steps/needs minimal assist    Standing Unsupported, One Foot in Front Needs help to step but can hold 15 seconds    Standing on One Leg Unable to try or needs assist to prevent fall    Total Score 33            PATIENT EDUCATION: Education details: Therapy POC, LTGs, HEP, importance of improving L LE flexibility, findings during assessment  Person educated: Patient Education method: Explanation and Handouts Education comprehension: verbalized understanding and needs further education  HOME EXERCISE PROGRAM:  Access Code: [email or web address] URL: [email or web address] Date: 03/10/2024 Prepared by: Connell Kiss  Exercises - Seated Hip Adduction Isometrics with Ball  - 1 x daily - 7 x weekly - 2 sets - 10 reps - 5 seconds hold - Supine Quadricep Sets  - 1 x daily - 7 x weekly - 2 sets - 10 reps - 3 seconds hold Use of Motus Nova Foot device for at least 1hour every day  GOALS: Goals reviewed with patient? Yes  SHORT TERM GOALS: Target date: 07/13/2024  Patient will be independent in home exercise program to improve strength/mobility for better functional independence with ADLs.  Baseline: initiated on 03/10/2024 04/14/2024: continued Goal status: IN PROGRESS   LONG TERM GOALS:  Target date: 08/24/2024  Patient (< 49 years old) will complete five  times sit to stand (5XSTS) test in < 10 seconds indicating an increased LE strength and improved balance.  Baseline: 03/15/2024: 16.53 seconds from standard green chair relying on R hand to push-up from armrest and pt's personal SBQC nearby, but not using it VERSUS 25.85 seconds with R hand across chest, with min A for balance/safety without use of UE support 04/14/2024: 8.96 seconds using R UE support to push-up from armrest and pt's SBQC next to him, 13.75 seconds with R hand across chest with min A for therapist to stabilize chair behind him 05/30/24: 13.01 sec with R UE support on chair; 15.26 sec with R hand across chest and minA from therapist to stabilize chair behind pt Goal status: IN PROGRESS  2.  Patient will increase Berg Balance score to > 45/56 to demonstrate improved balance and decreased fall risk during functional activities and ADLs.  Baseline: 03/15/2024: 14/56 04/14/2024: 22/56 05/30/24: TBD at next visit 07/27/2024: 33/56 Goal status: IN PROGRESS  3.  Patient will increase 10 meter walk test to >1.27m/s using LRAD as to improve gait speed for better community ambulation and to reduce fall risk.  Baseline: 0.30 m/s using small based quad cane with CGA and 1x light min A due to balance instability  04/14/2024: 0.405 m/s using SBQC with close SBA for safety 05/30/24: 0.379 m/s using SBQC with close SBA for safety.  07/21/2024= 0.43 m/s with SBQC with close SBA Goal status: IN PROGRESS  4.  Patient will increase six minute walk test distance to >548ft for progression towards community ambulator and improve gait ability  Baseline: 249ft with [facility]  04/20/2024:  29ft with SBQC and SBA 05/30/24: 360 ft with SBQC Goal status: IN PROGRESS  5.  Patient will reduce timed up and go to <15 seconds to reduce fall risk and demonstrate improved transfer/gait ability. Baseline: 03/15/2024: 25.32 seconds using SBQC and chair with  armrest as well as CGA for safety/steadying 04/14/2024: 22.25 seconds using SBQC & chair w/ armrest and close SBA for safety 05/30/24: 26.87 seconds w/ SBQC and use; 07/21/2024= 21.13 sec with SBQC and SBA Goal status: IN PROGRESS  6.  Patient will ascend/descend 4 stairs, using railing, independently without loss of balance to improve ability to get in/out of home.   Baseline: 03/31/2024: using R UE support on each HR with skilled min assist for balance using primarily step-to pattern 04/20/2024:  using R UE support on each HR with skilled CGA for safety using step-to pattern 05/30/24: continued use of R UE support on HR with skilled CGA for safety using step-to pattern Goal status: IN PROGRESS  ASSESSMENT:  CLINICAL IMPRESSION:  Treatment consisted of continuing assessment and reassessing goal including balance. Patient performed well- requiring increased time to complete with author explaining and demonstrating testing. He ultimately completed test and continues to demonstrate improvement in overall balance improved to a 33/56 from a last checked 22/56. Despite his improvement he remains at an increased risk of falling. Pt will continue to benefit from skilled therapy to address remaining deficits in order to improve overall QoL and return to PLOF.     OBJECTIVE IMPAIRMENTS: Abnormal gait, cardiopulmonary status limiting activity, decreased activity tolerance, decreased balance, decreased coordination, decreased endurance, decreased knowledge of use of DME, decreased mobility, difficulty walking, decreased ROM, decreased strength, hypomobility, impaired flexibility, impaired sensation, impaired tone, impaired UE functional use, improper body mechanics, postural dysfunction, and pain.   ACTIVITY LIMITATIONS: carrying, lifting, bending, standing, squatting, sleeping, stairs, transfers, bed mobility, continence, bathing, toileting,  dressing, reach over head, hygiene/grooming, locomotion level, and caring  for others  PARTICIPATION LIMITATIONS: meal prep, cleaning, laundry, medication management, shopping, community activity, and yard work  PERSONAL FACTORS: Age, Past/current experiences, Time since onset of injury/illness/exacerbation, Transportation, and 3+ comorbidities: HTN, hx of kidney stones, depression are also affecting patient's functional outcome.   REHAB POTENTIAL: Good  CLINICAL DECISION MAKING: Evolving/moderate complexity  EVALUATION COMPLEXITY: Moderate  PLAN:  PT FREQUENCY: 1-2x/week  PT DURATION: 12 weeks  PLANNED INTERVENTIONS: 97164- PT Re-evaluation, 97750- Physical Performance Testing, 97110-Therapeutic exercises, 97530- Therapeutic activity, [id number]- Neuromuscular re-education, 97535- Self Care, [id number]- Manual therapy, [id number]- Gait training, [phone]- Orthotic/Prosthetic subsequent, [phone]- Canalith repositioning, [phone]- Electrical stimulation (manual), Patient/Family education, Balance training, Stair training, Taping, Dry Needling, Joint mobilization, Joint manipulation, Vestibular training, Visual/preceptual remediation/compensation, DME instructions, Cryotherapy, Moist heat, and Biofeedback  PLAN FOR NEXT SESSION:   Assess 6 min walk next visit  ? Trial of new litegait on TM  Progress HEP  high intensity gait training using +2 HHA vs litegait support vs SBQC continue adding 7lb AW to L LE when appropriate vs decreasing RUE support/reliance while in litegait Backwards gait training - continue Stepping over obstacles - continue Continue stair training for L LE NMR For stance   R foot taps while sustaining L hip extension For swing phase Reciprocal foot taps L LE stretching and weightbearing for tone management and to improve alignment during gait/standing (avoid excessive hip ER) Non gait training ideas: bridges with R foot placed on unstable surface to promote increased L LE wt bearing    Chyrl London, PT Physical Therapist - [facility]  [facility]  7:56 PM 07/28/24

## 2024-08-03 ENCOUNTER — Telehealth: Payer: Self-pay

## 2024-08-03 ENCOUNTER — Other Ambulatory Visit: Payer: Self-pay

## 2024-08-03 ENCOUNTER — Ambulatory Visit: Admitting: Physical Therapy

## 2024-08-03 NOTE — Telephone Encounter (Signed)
 erroe

## 2024-08-03 NOTE — Telephone Encounter (Signed)
 Spoke with [facility] she said the pt is getting 4 fillings Friday at 2pm and the length of the procedure is 90 min long  Advised her next time send forms at least 2 weeks prior

## 2024-08-03 NOTE — Telephone Encounter (Signed)
 Paper in folder  Copied from CRM [id number]. Topic: General - Other >> Aug 03, 2024  9:53 AM Berneda FALCON wrote: Reason for CRM: Domenica from [facility] is calling to check on the status of a medical clearance form they faxed over this morning. He has an appt on Friday and it has to be filled out and returned before then in order for him to keep this appt. Called the CAL and confirmed they had not yet received it. I let her know and to go ahead and refax it.

## 2024-08-03 NOTE — Telephone Encounter (Signed)
 Copied from CRM [id number]. Topic: General - Other >> Aug 03, 2024  3:08 PM Aisha D wrote: Reason for CRM: Domenica from [facility] is calling to check on the status of a medical clearance form they faxed over this morning. He has an appt on Friday and it has to be filled out and returned before then in order for him to keep this appt. Called the CAL and confirmed they had not yet received it. I let her know and to go ahead and refax it.  UPDATE: Domenica called back to check the status of this request and wanted to confirm if the office received the fax. I informed her that Dr.Saguier just received the fax today and needing to confirm some information about the procedure. She stated that the pt is getting 4 fillings in total but is only receiving 2 fillings on Friday at the appt and its 90 mins long. She would also like for the forms to be faxed back today if possible.

## 2024-08-03 NOTE — Telephone Encounter (Signed)
 Hey I confirmed with Chiquita I can only change it when pt is in office

## 2024-08-04 ENCOUNTER — Other Ambulatory Visit: Payer: Self-pay

## 2024-08-04 ENCOUNTER — Telehealth: Payer: Self-pay

## 2024-08-04 NOTE — Telephone Encounter (Signed)
 Letter for problem list-- for dental clearance

## 2024-08-04 NOTE — Telephone Encounter (Signed)
Faxed over this morning

## 2024-08-04 NOTE — Telephone Encounter (Signed)
Dental forms faxed

## 2024-08-10 ENCOUNTER — Ambulatory Visit: Admitting: Physical Therapy

## 2024-08-10 DIAGNOSIS — I69354 Hemiplegia and hemiparesis following cerebral infarction affecting left non-dominant side: Secondary | ICD-10-CM | POA: Diagnosis not present

## 2024-08-10 DIAGNOSIS — M6281 Muscle weakness (generalized): Secondary | ICD-10-CM | POA: Diagnosis not present

## 2024-08-10 DIAGNOSIS — R2689 Other abnormalities of gait and mobility: Secondary | ICD-10-CM

## 2024-08-10 DIAGNOSIS — R2681 Unsteadiness on feet: Secondary | ICD-10-CM | POA: Diagnosis not present

## 2024-08-10 DIAGNOSIS — R278 Other lack of coordination: Secondary | ICD-10-CM | POA: Diagnosis not present

## 2024-08-10 DIAGNOSIS — R29818 Other symptoms and signs involving the nervous system: Secondary | ICD-10-CM | POA: Diagnosis not present

## 2024-08-10 NOTE — Therapy (Signed)
 OUTPATIENT PHYSICAL THERAPY NEURO TREATMENT   Patient Name: Matthew Black MRN: [id number] DOB:[birth year], 48 y.o., male Today's Date: 08/10/2024   PCP: Dorina Loving, PA-C REFERRING PROVIDER: Lorilee Sven SQUIBB, MD  END OF SESSION:   PT End of Session - 08/10/24 1404     Visit Number 32    Number of Visits 48    Date for PT Re-Evaluation 08/24/24    Authorization Type [employer] auth: [id number] for 16 pt vst from 8/12-10/07 (9/17 is 5 of 16)    PT Start Time 1404    PT Stop Time 1446    PT Time Calculation (min) 42 min    Equipment Utilized During Treatment Gait belt;Other (comment)   L LE AFO   Activity Tolerance Patient tolerated treatment well    Behavior During Therapy [facility] for tasks assessed/performed               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   Stroke (HCC)    RICA, R A1, R MCA occlusion 07/19/19   Past Surgical History:  Procedure Laterality Date   CRANIOPLASTY Right 06/05/2020   Procedure: CRANIOPLASTY;  Surgeon: Lanis Pupa, MD;  Location: [facility] OR;  Service: Neurosurgery;  Laterality: Right;  right   CRANIOTOMY Right 07/19/2019   Procedure: RIGHT HEMI-CRANIECTOMY With implantation of skull flap to abdominal wall;  Surgeon: Lanis Pupa, MD;  Location: [facility] OR;  Service: Neurosurgery;  Laterality: Right;   CYST EXCISION N/A 10/08/2016   Procedure: EXCISION OF POSTERIOR NECK CYST;  Surgeon: Mitzie DELENA Freund, MD;  Location: [facility] ORS;  Service: General;  Laterality: N/A;   CYSTOSCOPY/URETEROSCOPY/HOLMIUM LASER/STENT PLACEMENT Right 03/16/2020    Procedure: CYSTOSCOPY RIGHT RETROGRADE PYELOGRAM URETEROSCOPY/HOLMIUM LASER/STENT PLACEMENT;  Surgeon: Carolee Sherwood JONETTA DOUGLAS, MD;  Location: [facility];  Service: Urology;  Laterality: Right;   INCISION AND DRAINAGE ABSCESS N/A 09/22/2014   Procedure: INCISION AND DRAINAGE ABSCESS POSTERIOR NECK;  Surgeon: Donnice KATHEE Lunger, MD;  Location: [facility];  Service: General;  Laterality: N/A;   INCISION AND DRAINAGE ABSCESS N/A 12/20/2015   Procedure: INCISION AND DRAINAGE POSTERIOR NECK MASS;  Surgeon: Krystal Spinner, MD;  Location: [facility];  Service: General;  Laterality: N/A;   INCISION AND DRAINAGE ABSCESS Left 07/10/2004   middle finger   IR IVC FILTER PLMT / S&I /IMG GUID/MOD SED  07/21/2019   IR RADIOLOGIST EVAL & MGMT  12/14/2019   IR RADIOLOGIST EVAL & MGMT  12/21/2020   IR VENOGRAM RENAL UNI RIGHT  07/21/2019   MASS EXCISION N/A 07/21/2017   Procedure: EXCISION OF BENIGN NECK LESION WITH LAYERED CLOSURE;  Surgeon: Arelia Filippo, MD;  Location: [facility];  Service: Plastics;  Laterality: N/A;   MASS EXCISION N/A 11/10/2017   Procedure: EXCISION BENIGN LESION OF THE NECK WITH LAYERED CLOSURE;  Surgeon: Arelia Filippo, MD;  Location: [facility];  Service: Plastics;  Laterality: N/A;   Patient Active Problem List   Diagnosis Date Noted   Hx of adenomatous colonic polyps 06/01/2023   COVID-19 virus  infection 04/21/2021   Left-sided weakness 04/21/2021   S/P craniotomy 06/05/2020   History of cranioplasty 06/05/2020   Peri-rectal abscess 02/14/2020   Abnormal CT scan, pelvis 02/14/2020   Pancytopenia (HCC) 12/02/2019   Nephrolithiasis 12/02/2019   Hydronephrosis with renal and ureteral calculus obstruction 12/02/2019   Rectal pain 12/02/2019   Hyperkalemia 12/02/2019   Reactive depression    Wound infection after surgery    Sleep disturbance    Dysphagia, post-stroke    Transaminitis    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   Cerebral abscess     Urinary tract infection without hematuria    Altered mental status    Primary hypercoagulable state (HCC)    Acute pulmonary embolism without acute cor pulmonale (HCC)    Deep vein thrombosis (DVT) of non-extremity vein    Hypokalemia    Acute blood loss anemia    Leukocytosis    Endotracheal tube present    Acute respiratory failure with hypoxemia (HCC)    Stroke (cerebrum) (HCC) 07/19/2019   Pressure injury of skin 07/19/2019   Acute CVA (cerebrovascular accident) (HCC)    Encephalopathy    Dysphagia    Acute encephalopathy    Essential hypertension    Obesity 03/12/2016   Scalp abscess 12/20/2015   Neck abscess 12/20/2015   Pilonidal cyst 02/08/2013    ONSET DATE: stroke in August 2020  REFERRING DIAG: I63.9 (ICD-10-CM) - Cerebrovascular accident (CVA), unspecified mechanism (HCC)   THERAPY DIAG:   Hemiplegia and hemiparesis following cerebral infarction affecting left non-dominant side (HCC)  Muscle weakness (generalized)  Other lack of coordination  Unsteadiness on feet  Other abnormalities of gait and mobility  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)  Other symptoms and signs involving the nervous system   Rationale for Evaluation and Treatment: Rehabilitation  SUBJECTIVE:                                                                                                                                                                                             SUBJECTIVE STATEMENT:  Pt reports he is doing good. Reports he had some teeth fillings last Friday 9/12 and it went well. Pt states he has continued to have increased tightness with associated pain/discomfort in L shoulder. Pt states he was advised by MD to increase his baclofen  dosage and he has been taking 1 extra pill a day with no noticeable improvement in his symptoms. States he is due additional botox  injections at the end of the month and states he  plans to ask them to put it in his  pec muscle because that seems to help. Pt reports he is still using his Motus Nova Foot device 1x/day with goal of 1 hour, but states he is limited by his ability to don/doff the device independently.  Pt reports he has been practicing L LE single leg stance using UE support on his porch railing and states he feels it improves his L stance phase of gait. Pt states he has also tried walking without UE support at home, short distances, and says he can do it with step-to pattern, but isn't able to attempt reciprocal pattern. Denies falls/stumbles. No reports of pain.   Of note: Botox  in L LE biceps femoris on 06/24/2024 Has the Motus Nova Foot device    Initial Eval: Patient reports his goal is to walk without using a cane and without assistance. Patient states he wants to walk like he did before having the stroke. Reports his stroke was in August of 2020. States he currently uses a quad cane for ambulation and tries to walk as much as possible. Reports he has a manual wheelchair, but states he tries not to use it because it depresses him and makes him feel like he is back to when he first had his stroke. Reports wearing L LE custom AFO at all times.   Reports he is currently doing everything mod-I, but states he could call his mom, dad, or brothers if he needed assistance.  Reports he has routine botox  injections in L UE to help manage tone, but doesn't receive any in his LE.  Reports he has a hospital bed, but sleeps in a regular bed.  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  Etiology of carotid occlusion unclear possibly dissection.  He had a prolonged hospital admission with several complications including DVT, pulmonary embolism, hemorrhagic transformation, abdominal wall hematoma, UTI but made quite remarkable recovery   PAIN:  Are you having pain? No, denies pain today, but states sometimes will have L shoulder pain  PRECAUTIONS: Fall  RED FLAGS: None   WEIGHT BEARING RESTRICTIONS: No  FALLS: Has patient fallen in last 6 months? Yes, stating he fell down his stairs ~5 months ago and is now scared to perform stair navigation  LIVING ENVIRONMENT: Lives with: lives alone Lives in: House/apartment Stairs: Yes: Internal: flight steps; can reach both and External: 4 steps; can reach both, but states he doesn't have to go upstairs Has following equipment at home: Quad cane small base, Hemi walker, Wheelchair (manual), shower chair, Shower bench, and L LE custom AFO Pt has custom K5 wheelchair with rigid, contoured back support and Roho cushion. Pt reports he calls [employer] to come adjust his seat cushion when he feels it is getting uncomfortable.  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  DIAGNOSTIC FINDINGS:  EXAM: CT ANGIOGRAPHY HEAD AND NECK WITH AND WITHOUT CONTRAST IMPRESSION: 1. No acute intracranial abnormality. 2. Chronically occluded right ICA with minimal opacification of the right MCA and ACA branches. 3. Redemonstrated extensive encephalomalacia in the  right MCA and ACA territories secondary to a prior infarct.  Electronically Signed   By: Lyndall Gore M.D.   On: 11/27/2023 13:45  COGNITION: Overall cognitive status: Within functional limits for tasks assessed  VISION (03/29/2024): Therapist performed visual field screen with pt appearing to have L homonomous hemianopsia - pt confirms he has loss of vision on L side.   SENSATION: Light touch: Impaired in L LE Proprioception: Impaired  in L LE  Can feel deep pressure in L LE   COORDINATION: Impaired in L LE due to paresis, hypertonia, and overall decreased flexibility  EDEMA:  Not formally assessed  MUSCLE TONE: LLE: Mild, Moderate, and Hypertonic  MUSCLE LENGTH: Not formally assessed; however, demonstrates L hamstring muscle tightness  DTRs:  Not formally assessed  POSTURE: rounded shoulders, forward head, and weight shift right  LOWER EXTREMITY ROM:       Right Eval Active ROM Left Eval Passive ROM  Hip flexion WFL Achieves at least 90  Hip extension    Hip abduction    Hip adduction    Hip internal rotation  Unable to achieve neutral hip rotation, lacking internal rotation  Hip external rotation  [facility] and rests in excessive ER  Knee flexion [facility]  Knee extension WFL Able to achieve terminal knee extension in supine, but with significant discomfort due to hamstring tone  Ankle dorsiflexion WFL Did not remove AFO to assess this date  Ankle plantarflexion WFL Did not remove AFO to assess this date  Ankle inversion    Ankle eversion     (Blank rows = not tested)  LOWER EXTREMITY MMT:    MMT Right Eval Left Eval  Hip flexion 4+ 2-  (compensates with hip adductor activation)  Hip extension    Hip abduction    Hip adduction  3+  Hip internal rotation  2-  Hip external rotation  2  Knee flexion 4+ 1  Knee extension 4+ 3+  Ankle dorsiflexion 4+ Did not remove AFO to assess this date  Ankle plantarflexion 4+ Did not remove AFO to assess this date  Ankle  inversion    Ankle eversion    (Blank rows = not tested)  Manual Muscle Test Scale 0/5 = No muscle contraction can be seen or felt 1/5 = Contraction can be felt, but there is no motion 2-/5 = Part moves through incomplete ROM w/ gravity decreased 2/5 = Part moves through complete ROM w/ gravity decreased 2+/5 = Part moves through incomplete ROM (<50%) against gravity or through complete ROM w/ gravity 3-/5 = Part moves through incomplete ROM (>50%) against gravity 3/5 = Part moves through complete ROM against gravity 3+/5 = Part moves through complete ROM against gravity/slight resistance 4-/5= Holds test position against slight to moderate pressure 4/5 = Part moves through complete ROM against gravity/moderate resistance 4+/5= Holds test position against moderate to strong pressure 5/5 = Part moves through complete ROM against gravity/full resistance  BED MOBILITY: (sleeps in regular bed, but has hospital bed available) Findings: Sit to supine CGA Supine to sit Min A and Mod A Rolling to Right Mod A Rolling to Left Min A and Mod A  TRANSFERS: Sit to stand: CGA and Min A  Assistive device utilized:  Quad cane small base     Stand to sit: CGA and Min A  Assistive device utilized: Tree surgeon to chair: Min A  Assistive device utilized: Counselling psychologist       RAMP:  Not tested  CURB:  Not tested  STAIRS: Not tested *need to assess*  GAIT: Findings: advances L LE with excessive hip external rotation using hip adductors, step to pattern, decreased arm swing- Left, decreased step length- Right, decreased stance time- Left, decreased stride length, decreased hip/knee flexion- Left, decreased ankle dorsiflexion- Left, lateral lean- Right, and decreased trunk rotation Distance walked: ~19ft Assistive device utilized: small based quad cane in R UE  Level of assistance: CGA and Min A  FUNCTIONAL TESTS:  5 times sit to stand: need to assess Timed up and go  (TUG): need to assess 6 minute walk test: need to assess 10 meter walk test: 0.67m/s using small based quad cane and CGA Berg Balance Scale: need to assess  PATIENT SURVEYS:  Stroke Impact Scale 03/15/2024: 49/80                                                                                                                               TREATMENT DATE: 08/10/2024  Gait into therapy clinic using [facility] mod-I with only slight step-through pattern leading with L LE.  Therapy session focused on finishing assessment of standardized outcome measures from most recent progress note.  Five times Sit to Stand Test (FTSS) "Stand up and sit down as quickly as possible 5 times, keeping your arms folded across your chest."    TIME: 12.56 seconds using R UE support on chair armrest TIME: 20.28 seconds with RUE across chest and chair pushed up against a wall, but therapist not stabilizing it (it was still shifting) TIME: 14.20 seconds with R UE across chest and chair pushed up against a wall - therapist educating/cuing patient to scoot closer to front of seat and stay towards middle/front of seat during the transfers to improve his balance   Times > 13.6 seconds is associated with increased disability and morbidity (Guralnik, 2000) Times > 15 seconds is predictive of recurrent falls in healthy individuals aged 58 and older (Buatois, et al., 2008) Normal performance values in community dwelling individuals aged 46 and older (Bohannon, 2006): 60-69 years: 11.4 seconds 70-79 years: 12.6 seconds 80-89 years: 14.8 seconds  MCID: >= 2.3 seconds for Vestibular Disorders (Meretta, 2006)    6 Min Walk Test:  Instructed patient to ambulate as quickly and as safely as possible for 6 minutes using LRAD. Patient was allowed to take standing rest breaks without stopping the test, but if the patient required a sitting rest break the clock would be stopped and the test would be over.  Results: 416 feet (126.80 meters,  Avg speed 0.33m/s) using SBQC mod-I. Results indicate that the patient has reduced endurance with ambulation compared to age  matched norms.  Pt requires seated therapeutic rest break following this assessment as pt still demonstrates impaired endurance, although improving Age Matched Norms: 52-69 yo M: 14 F: 71, 74-79 yo M: 73 F: 471, 22-89 yo M: 417 F: 392 MDC: 58.21 meters (190.98 feet) or 50 meters (ANPTA Core Set of Outcome Measures for Adults with Neurologic Conditions, 2018)    Stair navigation training ascending/descending 4 steps (6 height) using R UE support on each HR with supervision - step-to pattern leading with R LE on ascent and L LE on descent.  Educated pt on goal to perform HIGT (high intensity gait training) on the treadmill at next therapy session.   PATIENT EDUCATION: Education details: Therapy POC, LTGs, HEP, importance of improving L LE flexibility, findings during assessment  Person educated: Patient Education method: Explanation and Handouts Education comprehension: verbalized understanding and needs further education  HOME EXERCISE PROGRAM:  Access Code: [email or web address] URL: [email or web address] Date: 03/10/2024 Prepared by: Connell Kiss  Exercises - Seated Hip Adduction Isometrics with Ball  - 1 x daily - 7 x weekly - 2 sets - 10 reps - 5 seconds hold - Supine Quadricep Sets  - 1 x daily - 7 x weekly - 2 sets - 10 reps - 3 seconds hold Use of Motus Nova Foot device for at least 1hour every day  GOALS: Goals reviewed with patient? Yes  SHORT TERM GOALS: Target date: 07/13/2024  Patient will be independent in home exercise program to improve strength/mobility for better functional independence with ADLs.  Baseline: initiated on 03/10/2024 04/14/2024: continued Goal status: IN PROGRESS   LONG TERM GOALS: Target date: 08/24/2024  Patient (< 63 years old) will complete five times sit to stand (5XSTS) test in < 10 seconds indicating an  increased LE strength and improved balance.  Baseline: 03/15/2024: 16.53 seconds from standard green chair relying on R hand to push-up from armrest and pt's personal SBQC nearby, but not using it VERSUS 25.85 seconds with R hand across chest, with min A for balance/safety without use of UE support 04/14/2024: 8.96 seconds using R UE support to push-up from armrest and pt's SBQC next to him, 13.75 seconds with R hand across chest with min A for therapist to stabilize chair behind him 05/30/24: 13.01 sec with R UE support on chair; 15.26 sec with R hand across chest and minA from therapist to stabilize chair behind pt 08/10/2024: 12.56 seconds using R UE support on chair armrest & 14.20 seconds with R UE across chest & chair against wall Goal status: IN PROGRESS  2.  Patient will increase Berg Balance score to > 45/56 to demonstrate improved balance and decreased fall risk during functional activities and ADLs.  Baseline: 03/15/2024: 14/56 04/14/2024: 22/56 05/30/24: TBD at next visit 07/27/2024: 33/56 Goal status: IN PROGRESS  3.  Patient will increase 10 meter walk test to >1.29m/s using LRAD as to improve gait speed for better community ambulation and to reduce fall risk.  Baseline: 0.30 m/s using small based quad cane with CGA and 1x light min A due to balance instability  04/14/2024: 0.405 m/s using SBQC with close SBA for safety 05/30/24: 0.379 m/s using SBQC with close SBA for safety.  07/21/2024= 0.43 m/s with SBQC with close SBA Goal status: IN PROGRESS  4.  Patient will increase six minute walk test distance to >565ft for progression towards community ambulator and improve gait ability  Baseline: 222ft with [facility]  04/20/2024:  282ft with SBQC and SBA 05/30/24: 360 ft with  [facility] 08/10/2024: 416 feet (126.80 meters, Avg speed 0.39m/s) using SBQC mod-I. Goal status: IN PROGRESS  5.  Patient will reduce timed up and go to <15 seconds to reduce fall risk and demonstrate improved transfer/gait  ability. Baseline: 03/15/2024: 25.32 seconds using SBQC and chair with armrest as well as CGA for safety/steadying 04/14/2024: 22.25 seconds using SBQC & chair w/ armrest and close SBA for safety 05/30/24: 26.87 seconds w/ SBQC and use; 07/21/2024= 21.13 sec with SBQC and SBA 07/21/2024:  21.13 sec with SBQC and SBA  Goal status: IN PROGRESS  6.  Patient will ascend/descend 4 stairs, using railing, independently without loss of balance to improve ability to get in/out of home.   Baseline: 03/31/2024: using R UE support on each HR with skilled min assist for balance using primarily step-to pattern 04/20/2024:  using R UE support on each HR with skilled CGA for safety using step-to pattern 05/30/24: continued use of R UE support on HR with skilled CGA for safety using step-to pattern 08/10/2024: R UE support on HR, step-to pattern, supervision only Goal status: IN PROGRESS  ASSESSMENT:  CLINICAL IMPRESSION:  Therapy session focused on finishing standardized assessments for progress note to track patient's progress with therapy. Patient demonstrates improving 5xSTS both with and without R UE support, indicating improving R LE functional strength as well as improving standing balance as pt with limited ability to support himself with L LE due to flexor tone, limiting ability for L foot to be flat on ground. Patient also demonstrates improvement on indicating increasing gait endurance as well as independence with gait as pt performs this with no more than supervision. Patient also demonstrates improving stair navigation with ability to complete it at supervision level using step-to pattern. Patient remains highly motivated to participate in therapy and is eager to participate in HIGT on the treadmill, now that this is a new intervention offered by this clinic that has been shown to have significant improvement in pt's functional gait speed and endurance. Pt will continue to benefit from skilled therapy to  address remaining deficits in order to improve overall QoL and return to PLOF.     OBJECTIVE IMPAIRMENTS: Abnormal gait, cardiopulmonary status limiting activity, decreased activity tolerance, decreased balance, decreased coordination, decreased endurance, decreased knowledge of use of DME, decreased mobility, difficulty walking, decreased ROM, decreased strength, hypomobility, impaired flexibility, impaired sensation, impaired tone, impaired UE functional use, improper body mechanics, postural dysfunction, and pain.   ACTIVITY LIMITATIONS: carrying, lifting, bending, standing, squatting, sleeping, stairs, transfers, bed mobility, continence, bathing, toileting, dressing, reach over head, hygiene/grooming, locomotion level, and caring for others  PARTICIPATION LIMITATIONS: meal prep, cleaning, laundry, medication management, shopping, community activity, and yard work  PERSONAL FACTORS: Age, Past/current experiences, Time since onset of injury/illness/exacerbation, Transportation, and 3+ comorbidities: HTN, hx of kidney stones, depression are also affecting patient's functional outcome.   REHAB POTENTIAL: Good  CLINICAL DECISION MAKING: Evolving/moderate complexity  EVALUATION COMPLEXITY: Moderate  PLAN:  PT FREQUENCY: 1-2x/week  PT DURATION: 12 weeks  PLANNED INTERVENTIONS: [id number]- PT Re-evaluation, 97750- Physical Performance Testing, 97110-Therapeutic exercises, 97530- Therapeutic activity, [id number]- Neuromuscular re-education, 97535- Self Care, [id number]- Manual therapy, [id number]- Gait training, [phone]- Orthotic/Prosthetic subsequent, [phone]- Canalith repositioning, [phone]- Electrical stimulation (manual), Patient/Family education, Balance training, Stair training, Taping, Dry Needling, Joint mobilization, Joint manipulation, Vestibular training, Visual/preceptual remediation/compensation, DME instructions, Cryotherapy, Moist heat, and Biofeedback  PLAN FOR NEXT SESSION:   *HIGT on  treadmill*  Progress HEP  high intensity gait training using +2 HHA vs  litegait support vs SBQC continue adding 7lb AW to L LE when appropriate vs decreasing RUE support/reliance while in litegait Backwards gait training - continue Stepping over obstacles - continue Continue stair training for L LE NMR For stance   R foot taps while sustaining L hip extension For swing phase Reciprocal foot taps L LE stretching and weightbearing for tone management and to improve alignment during gait/standing (avoid excessive hip ER) Non gait training ideas: bridges with R foot placed on unstable surface to promote increased L LE wt bearing    Lun Muro, PT, DPT, NCS, CSRS Physical Therapist - [place]  [facility]  5:53 PM 08/10/24

## 2024-08-15 ENCOUNTER — Ambulatory Visit: Admitting: Physical Therapy

## 2024-08-15 DIAGNOSIS — M6281 Muscle weakness (generalized): Secondary | ICD-10-CM | POA: Diagnosis not present

## 2024-08-15 DIAGNOSIS — I69354 Hemiplegia and hemiparesis following cerebral infarction affecting left non-dominant side: Secondary | ICD-10-CM

## 2024-08-15 DIAGNOSIS — R2689 Other abnormalities of gait and mobility: Secondary | ICD-10-CM | POA: Diagnosis not present

## 2024-08-15 DIAGNOSIS — R29818 Other symptoms and signs involving the nervous system: Secondary | ICD-10-CM

## 2024-08-15 DIAGNOSIS — R278 Other lack of coordination: Secondary | ICD-10-CM

## 2024-08-15 DIAGNOSIS — R2681 Unsteadiness on feet: Secondary | ICD-10-CM | POA: Diagnosis not present

## 2024-08-15 NOTE — Therapy (Signed)
 OUTPATIENT PHYSICAL THERAPY NEURO TREATMENT   Patient Name: Matthew Black MRN: [id number] DOB:[birth year], 48 y.o., male Today's Date: 08/15/2024   PCP: Dorina Loving, PA-C REFERRING PROVIDER: Lorilee Sven SQUIBB, MD  END OF SESSION:   PT End of Session - 08/15/24 1535     Visit Number 33    Number of Visits 48    Date for Recertification  08/24/24    Authorization Type [employer] auth: [id number] for 16 pt vst from 8/12-10/07 (9/22 is 6 of 16)    PT Start Time 1535    PT Stop Time 1616    PT Time Calculation (min) 41 min    Equipment Utilized During Treatment Gait belt;Other (comment)   L LE AFO   Activity Tolerance Patient tolerated treatment well    Behavior During Therapy [facility] for tasks assessed/performed                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   Stroke (HCC)    RICA, R A1, R MCA occlusion 07/19/19   Past Surgical History:  Procedure Laterality Date   CRANIOPLASTY Right 06/05/2020   Procedure: CRANIOPLASTY;  Surgeon: Lanis Pupa, MD;  Location: [facility] OR;  Service: Neurosurgery;  Laterality: Right;  right   CRANIOTOMY Right 07/19/2019   Procedure: RIGHT HEMI-CRANIECTOMY With implantation of skull flap to abdominal wall;  Surgeon: Lanis Pupa, MD;  Location: [facility] OR;  Service: Neurosurgery;  Laterality: Right;   CYST EXCISION N/A 10/08/2016   Procedure: EXCISION OF POSTERIOR NECK CYST;  Surgeon: Mitzie DELENA Freund, MD;  Location: [facility] ORS;  Service: General;  Laterality: N/A;   CYSTOSCOPY/URETEROSCOPY/HOLMIUM LASER/STENT PLACEMENT Right 03/16/2020    Procedure: CYSTOSCOPY RIGHT RETROGRADE PYELOGRAM URETEROSCOPY/HOLMIUM LASER/STENT PLACEMENT;  Surgeon: Carolee Sherwood JONETTA DOUGLAS, MD;  Location: [facility] ORS;  Service: Urology;  Laterality: Right;   INCISION AND DRAINAGE ABSCESS N/A 09/22/2014   Procedure: INCISION AND DRAINAGE ABSCESS POSTERIOR NECK;  Surgeon: Donnice KATHEE Lunger, MD;  Location: [facility] ORS;  Service: General;  Laterality: N/A;   INCISION AND DRAINAGE ABSCESS N/A 12/20/2015   Procedure: INCISION AND DRAINAGE POSTERIOR NECK MASS;  Surgeon: Krystal Spinner, MD;  Location: [facility] ORS;  Service: General;  Laterality: N/A;   INCISION AND DRAINAGE ABSCESS Left 07/10/2004   middle finger   IR IVC FILTER PLMT / S&I /IMG GUID/MOD SED  07/21/2019   IR RADIOLOGIST EVAL & MGMT  12/14/2019   IR RADIOLOGIST EVAL & MGMT  12/21/2020   IR VENOGRAM RENAL UNI RIGHT  07/21/2019   MASS EXCISION N/A 07/21/2017   Procedure: EXCISION OF BENIGN NECK LESION WITH LAYERED CLOSURE;  Surgeon: Arelia Filippo, MD;  Location: [facility];  Service: Plastics;  Laterality: N/A;   MASS EXCISION N/A 11/10/2017   Procedure: EXCISION BENIGN LESION OF THE NECK WITH LAYERED CLOSURE;  Surgeon: Arelia Filippo, MD;  Location: [facility];  Service: Plastics;  Laterality: N/A;   Patient Active Problem List   Diagnosis Date Noted   Hx of adenomatous colonic polyps 06/01/2023   COVID-19  virus infection 04/21/2021   Left-sided weakness 04/21/2021   S/P craniotomy 06/05/2020   History of cranioplasty 06/05/2020   Peri-rectal abscess 02/14/2020   Abnormal CT scan, pelvis 02/14/2020   Pancytopenia (HCC) 12/02/2019   Nephrolithiasis 12/02/2019   Hydronephrosis with renal and ureteral calculus obstruction 12/02/2019   Rectal pain 12/02/2019   Hyperkalemia 12/02/2019   Reactive depression    Wound infection after surgery    Sleep disturbance    Dysphagia, post-stroke    Transaminitis    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   Cerebral abscess     Urinary tract infection without hematuria    Altered mental status    Primary hypercoagulable state    Acute pulmonary embolism without acute cor pulmonale (HCC)    Deep vein thrombosis (DVT) of non-extremity vein    Hypokalemia    Acute blood loss anemia    Leukocytosis    Endotracheal tube present    Acute respiratory failure with hypoxemia (HCC)    Stroke (cerebrum) (HCC) 07/19/2019   Pressure injury of skin 07/19/2019   Acute CVA (cerebrovascular accident) (HCC)    Encephalopathy    Dysphagia    Acute encephalopathy    Essential hypertension    Obesity 03/12/2016   Scalp abscess 12/20/2015   Neck abscess 12/20/2015   Pilonidal cyst 02/08/2013    ONSET DATE: stroke in August 2020  REFERRING DIAG: I63.9 (ICD-10-CM) - Cerebrovascular accident (CVA), unspecified mechanism (HCC)   THERAPY DIAG:   Hemiplegia and hemiparesis following cerebral infarction affecting left non-dominant side (HCC)  Muscle weakness (generalized)  Other lack of coordination  Unsteadiness on feet  Other abnormalities of gait and mobility  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)  Other symptoms and signs involving the nervous system   Rationale for Evaluation and Treatment: Rehabilitation  SUBJECTIVE:                                                                                                                                                                                             SUBJECTIVE STATEMENT:  Pt reports he is doing good. Denies any updates. Pt continues to state he is experiencing increased tightness in L shoulder and his apt for botox  is not until November 3rd. Continues to report he is still taking the increased dosage of baclofen  even though it is not making noticeable change - reports he is taking a pill every 8 hrs. Continues to report he is practicing SLS at rail at home. Continues to report he has difficult donning/doffing  Motus Nova Foot device. Denies falls/stumbles. No reports of  pain.   Of note: Botox  in L LE biceps femoris on 06/24/2024 Has the Motus Nova Foot device    Initial Eval: Patient reports his goal is to walk without using a cane and without assistance. Patient states he wants to walk like he did before having the stroke. Reports his stroke was in August of 2020. States he currently uses a quad cane for ambulation and tries to walk as much as possible. Reports he has a manual wheelchair, but states he tries not to use it because it depresses him and makes him feel like he is back to when he first had his stroke. Reports wearing L LE custom AFO at all times.   Reports he is currently doing everything mod-I, but states he could call his mom, dad, or brothers if he needed assistance.  Reports he has routine botox  injections in L UE to help manage tone, but doesn't receive any in his LE.  Reports he has a hospital bed, but sleeps in a regular bed.  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  Etiology of carotid occlusion unclear possibly dissection.  He had a prolonged hospital admission with several complications including DVT, pulmonary embolism, hemorrhagic transformation, abdominal wall hematoma, UTI but made quite remarkable recovery   PAIN:  Are you having pain? No, denies pain today, but states sometimes will have L shoulder pain  PRECAUTIONS: Fall  RED FLAGS: None   WEIGHT BEARING RESTRICTIONS: No  FALLS: Has patient fallen in last 6 months? Yes, stating he fell  down his stairs ~5 months ago and is now scared to perform stair navigation  LIVING ENVIRONMENT: Lives with: lives alone Lives in: House/apartment Stairs: Yes: Internal: flight steps; can reach both and External: 4 steps; can reach both, but states he doesn't have to go upstairs Has following equipment at home: Quad cane small base, Hemi walker, Wheelchair (manual), shower chair, Shower bench, and L LE custom AFO Pt has custom K5 wheelchair with rigid, contoured back support and Roho cushion. Pt reports he calls [employer] to come adjust his seat cushion when he feels it is getting uncomfortable.  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  DIAGNOSTIC FINDINGS:  EXAM: CT ANGIOGRAPHY HEAD AND NECK WITH AND WITHOUT CONTRAST IMPRESSION: 1. No acute intracranial abnormality. 2. Chronically occluded right ICA with minimal opacification of the right MCA and ACA branches. 3. Redemonstrated extensive encephalomalacia in the right MCA and ACA territories secondary to a prior infarct.  Electronically Signed   By: Lyndall Gore M.D.   On: 11/27/2023 13:45  COGNITION: Overall cognitive status: Within functional limits for tasks assessed  VISION (03/29/2024): Therapist performed visual field screen with pt appearing to have L homonomous hemianopsia - pt confirms he has loss of vision  on L side.   SENSATION: Light touch: Impaired in L LE Proprioception: Impaired  in L LE  Can feel deep pressure in L LE   COORDINATION: Impaired in L LE due to  paresis, hypertonia, and overall decreased flexibility  EDEMA:  Not formally assessed  MUSCLE TONE: LLE: Mild, Moderate, and Hypertonic  MUSCLE LENGTH: Not formally assessed; however, demonstrates L hamstring muscle tightness  DTRs:  Not formally assessed  POSTURE: rounded shoulders, forward head, and weight shift right  LOWER EXTREMITY ROM:       Right Eval Active ROM Left Eval Passive ROM  Hip flexion WFL Achieves at least 90  Hip extension    Hip abduction    Hip adduction    Hip internal rotation  Unable to achieve neutral hip rotation, lacking internal rotation  Hip external rotation  [facility] and rests in excessive ER  Knee flexion [facility]  Knee extension WFL Able to achieve terminal knee extension in supine, but with significant discomfort due to hamstring tone  Ankle dorsiflexion WFL Did not remove AFO to assess this date  Ankle plantarflexion WFL Did not remove AFO to assess this date  Ankle inversion    Ankle eversion     (Blank rows = not tested)  LOWER EXTREMITY MMT:    MMT Right Eval Left Eval  Hip flexion 4+ 2-  (compensates with hip adductor activation)  Hip extension    Hip abduction    Hip adduction  3+  Hip internal rotation  2-  Hip external rotation  2  Knee flexion 4+ 1  Knee extension 4+ 3+  Ankle dorsiflexion 4+ Did not remove AFO to assess this date  Ankle plantarflexion 4+ Did not remove AFO to assess this date  Ankle inversion    Ankle eversion    (Blank rows = not tested)  Manual Muscle Test Scale 0/5 = No muscle contraction can be seen or felt 1/5 = Contraction can be felt, but there is no motion 2-/5 = Part moves through incomplete ROM w/ gravity decreased 2/5 = Part moves through complete ROM w/ gravity decreased 2+/5 = Part moves through incomplete ROM (<50%) against gravity or through complete ROM w/ gravity 3-/5 = Part moves through incomplete ROM (>50%) against gravity 3/5 = Part moves through complete ROM against  gravity 3+/5 = Part moves through complete ROM against gravity/slight resistance 4-/5= Holds test position against slight to moderate pressure 4/5 = Part moves through complete ROM against gravity/moderate resistance 4+/5= Holds test position against moderate to strong pressure 5/5 = Part moves through complete ROM against gravity/full resistance  BED MOBILITY: (sleeps in regular bed, but has hospital bed available) Findings: Sit to supine CGA Supine to sit Min A and Mod A Rolling to Right Mod A Rolling to Left Min A and Mod A  TRANSFERS: Sit to stand: CGA and Min A  Assistive device utilized: Counselling psychologist     Stand to sit: CGA and Min A  Assistive device utilized: Tree surgeon to chair: Min A  Assistive device utilized: Counselling psychologist       RAMP:  Not tested  CURB:  Not tested  STAIRS: Not tested *need to assess*  GAIT: Findings: advances L LE with excessive hip external rotation using hip adductors, step to pattern, decreased arm swing- Left, decreased step length- Right, decreased stance time- Left, decreased stride length, decreased hip/knee flexion- Left, decreased ankle dorsiflexion- Left, lateral  lean- Right, and decreased trunk rotation Distance walked: ~66ft Assistive device utilized: small based quad cane in R UE  Level of assistance: CGA and Min A  FUNCTIONAL TESTS:  5 times sit to stand: need to assess Timed up and go (TUG): need to assess 6 minute walk test: need to assess 10 meter walk test: 0.33m/s using small based quad cane and CGA Berg Balance Scale: need to assess  PATIENT SURVEYS:  Stroke Impact Scale 03/15/2024: 49/80                                                                                                                               TREATMENT DATE: 08/15/2024  Gait into therapy clinic using [facility] mod-I with only slight step-through pattern leading with L LE.  Donned litegait harness with R UE support on  litegait handle.  Gait training the following trials on treadmill in litegait harness, not providing BWS, only used for balance: 20min05sec at 0.53mph increased to 0.30mph, totaling 140ft Using R UE support on handle Therapist providing min manual facilitation for increased L LE swing phase advancement 1 min 10 sec at 0.39mph increased to 0.28mph, totaling 56 ft H4R 86 bpm R UE support on handle No assist/facilitation provided for L LE advancement, which may have contributed to pt more quickly fatiguing 21min30sec at 0.26mph increased to to 0.54mph, totalling 153ft R UE support on handle HR 85bpm  Only a few instances of min assist for improved L LE swing phase advancements Throughout, pt demos the following gait deviations: Decreased L wt shift onto L stance phase L LE circumduction during swing with excessive hip ER; although achieves positive step length Cuing for increased R LE step length to achieve consistent reciprocal stepping pattern and this also results in increased L stance phase time  Stepped off treadmill in litegait harness for balance safety and therapist providing min manual facilitation for L LE management.   Doffed harness and pt states That's the most exercise I've done in 6 years and pt reports he really enjoyed the treadmill and the challenge of it. Pt reports he is excited to participate in treadmill training again during next session.  Transported pt up to front of hospital in transport chair.  PATIENT EDUCATION: Education details: Therapy POC, LTGs, HEP, importance of improving L LE flexibility, findings during assessment  Person educated: Patient Education method: Explanation and Handouts Education comprehension: verbalized understanding and needs further education  HOME EXERCISE PROGRAM:  Access Code: [email or web address] URL: [email or web address] Date: 03/10/2024 Prepared by: Connell Kiss  Exercises - Seated Hip Adduction Isometrics with Ball  - 1 x  daily - 7 x weekly - 2 sets - 10 reps - 5 seconds hold - Supine Quadricep Sets  - 1 x daily - 7 x weekly - 2 sets - 10 reps - 3 seconds hold Use of Motus Nova Foot device for at least 1hour every day  GOALS: Goals reviewed with patient? Yes  SHORT  TERM GOALS: Target date: 07/13/2024  Patient will be independent in home exercise program to improve strength/mobility for better functional independence with ADLs.  Baseline: initiated on 03/10/2024 04/14/2024: continued Goal status: IN PROGRESS   LONG TERM GOALS: Target date: 08/24/2024  Patient (< 54 years old) will complete five times sit to stand (5XSTS) test in < 10 seconds indicating an increased LE strength and improved balance.  Baseline: 03/15/2024: 16.53 seconds from standard green chair relying on R hand to push-up from armrest and pt's personal SBQC nearby, but not using it VERSUS 25.85 seconds with R hand across chest, with min A for balance/safety without use of UE support 04/14/2024: 8.96 seconds using R UE support to push-up from armrest and pt's SBQC next to him, 13.75 seconds with R hand across chest with min A for therapist to stabilize chair behind him 05/30/24: 13.01 sec with R UE support on chair; 15.26 sec with R hand across chest and minA from therapist to stabilize chair behind pt 08/10/2024: 12.56 seconds using R UE support on chair armrest & 14.20 seconds with R UE across chest & chair against wall Goal status: IN PROGRESS  2.  Patient will increase Berg Balance score to > 45/56 to demonstrate improved balance and decreased fall risk during functional activities and ADLs.  Baseline: 03/15/2024: 14/56 04/14/2024: 22/56 05/30/24: TBD at next visit 07/27/2024: 33/56 Goal status: IN PROGRESS  3.  Patient will increase 10 meter walk test to >1.65m/s using LRAD as to improve gait speed for better community ambulation and to reduce fall risk.  Baseline: 0.30 m/s using small based quad cane with CGA and 1x light min A due to balance  instability  04/14/2024: 0.405 m/s using SBQC with close SBA for safety 05/30/24: 0.379 m/s using SBQC with close SBA for safety.  07/21/2024= 0.43 m/s with SBQC with close SBA Goal status: IN PROGRESS  4.  Patient will increase six minute walk test distance to >587ft for progression towards community ambulator and improve gait ability  Baseline: 290ft with [facility]  04/20/2024:  242ft with SBQC and SBA 05/30/24: 360 ft with [facility] 08/10/2024: 416 feet (126.80 meters, Avg speed 0.19m/s) using SBQC mod-I. Goal status: IN PROGRESS  5.  Patient will reduce timed up and go to <15 seconds to reduce fall risk and demonstrate improved transfer/gait ability. Baseline: 03/15/2024: 25.32 seconds using SBQC and chair with armrest as well as CGA for safety/steadying 04/14/2024: 22.25 seconds using SBQC & chair w/ armrest and close SBA for safety 05/30/24: 26.87 seconds w/ SBQC and use; 07/21/2024= 21.13 sec with SBQC and SBA 07/21/2024:  21.13 sec with SBQC and SBA  Goal status: IN PROGRESS  6.  Patient will ascend/descend 4 stairs, using railing, independently without loss of balance to improve ability to get in/out of home.   Baseline: 03/31/2024: using R UE support on each HR with skilled min assist for balance using primarily step-to pattern 04/20/2024:  using R UE support on each HR with skilled CGA for safety using step-to pattern 05/30/24: continued use of R UE support on HR with skilled CGA for safety using step-to pattern 08/10/2024: R UE support on HR, step-to pattern, supervision only Goal status: IN PROGRESS  ASSESSMENT:  CLINICAL IMPRESSION:  Therapy session focused on initiating higher intensity gait training on treadmill with use of litegait harness for balance support and safety. Patient initially started at 0.2mph requiring min manual facilitation for L LE management to help with motor planning and sequencing, then increased gait time  and distance with only a few instances of min A for L LE management by  final gait trial. Patient reports this as the most exercise he has done in 6 years; however, did not see a significant HR change; therefore, may have to rely more on Borg RPE scale to determine intensity. Patient remains highly motivated to participate in therapy and is eager to continue participating in HIGT on the treadmill, which is an intervention that has been shown to have significant impact on gait speed and endurance. Pt will continue to benefit from skilled therapy to address remaining deficits in order to improve overall QoL and return to PLOF.     OBJECTIVE IMPAIRMENTS: Abnormal gait, cardiopulmonary status limiting activity, decreased activity tolerance, decreased balance, decreased coordination, decreased endurance, decreased knowledge of use of DME, decreased mobility, difficulty walking, decreased ROM, decreased strength, hypomobility, impaired flexibility, impaired sensation, impaired tone, impaired UE functional use, improper body mechanics, postural dysfunction, and pain.   ACTIVITY LIMITATIONS: carrying, lifting, bending, standing, squatting, sleeping, stairs, transfers, bed mobility, continence, bathing, toileting, dressing, reach over head, hygiene/grooming, locomotion level, and caring for others  PARTICIPATION LIMITATIONS: meal prep, cleaning, laundry, medication management, shopping, community activity, and yard work  PERSONAL FACTORS: Age, Past/current experiences, Time since onset of injury/illness/exacerbation, Transportation, and 3+ comorbidities: HTN, hx of kidney stones, depression are also affecting patient's functional outcome.   REHAB POTENTIAL: Good  CLINICAL DECISION MAKING: Evolving/moderate complexity  EVALUATION COMPLEXITY: Moderate  PLAN:  PT FREQUENCY: 1-2x/week  PT DURATION: 12 weeks  PLANNED INTERVENTIONS: [id number]- PT Re-evaluation, 97750- Physical Performance Testing, 97110-Therapeutic exercises, 97530- Therapeutic activity, [id number]- Neuromuscular  re-education, 97535- Self Care, [id number]- Manual therapy, [id number]- Gait training, [phone]- Orthotic/Prosthetic subsequent, [phone]- Canalith repositioning, [phone]- Electrical stimulation (manual), Patient/Family education, Balance training, Stair training, Taping, Dry Needling, Joint mobilization, Joint manipulation, Vestibular training, Visual/preceptual remediation/compensation, DME instructions, Cryotherapy, Moist heat, and Biofeedback  PLAN FOR NEXT SESSION:   *HIGT on treadmill* Increasing speed and gradually work towards adding L LE AW or variable direction walking or no R UE support  Progress HEP  high intensity gait training using +2 HHA vs litegait support vs SBQC continue adding 7lb AW to L LE when appropriate vs decreasing RUE support/reliance while in litegait Backwards gait training - continue Stepping over obstacles - continue Continue stair training for L LE NMR For stance   R foot taps while sustaining L hip extension For swing phase Reciprocal foot taps L LE stretching and weightbearing for tone management and to improve alignment during gait/standing (avoid excessive hip ER) Non gait training ideas: bridges with R foot placed on unstable surface to promote increased L LE wt bearing    Ronetta Molla, PT, DPT, NCS, CSRS Physical Therapist - [place]  [facility]  4:35 PM 08/15/24

## 2024-08-17 ENCOUNTER — Ambulatory Visit: Admitting: Physical Therapy

## 2024-08-17 DIAGNOSIS — R278 Other lack of coordination: Secondary | ICD-10-CM | POA: Diagnosis not present

## 2024-08-17 DIAGNOSIS — I69354 Hemiplegia and hemiparesis following cerebral infarction affecting left non-dominant side: Secondary | ICD-10-CM

## 2024-08-17 DIAGNOSIS — M6281 Muscle weakness (generalized): Secondary | ICD-10-CM | POA: Diagnosis not present

## 2024-08-17 DIAGNOSIS — R29818 Other symptoms and signs involving the nervous system: Secondary | ICD-10-CM | POA: Diagnosis not present

## 2024-08-17 DIAGNOSIS — R2689 Other abnormalities of gait and mobility: Secondary | ICD-10-CM | POA: Diagnosis not present

## 2024-08-17 DIAGNOSIS — R2681 Unsteadiness on feet: Secondary | ICD-10-CM | POA: Diagnosis not present

## 2024-08-17 NOTE — Therapy (Signed)
 OUTPATIENT PHYSICAL THERAPY NEURO TREATMENT   Patient Name: Matthew Black MRN: [id number] DOB:Nov 15, 1976, 48 y.o., male Today's Date: 08/17/2024   PCP: Dorina Loving, PA-C REFERRING PROVIDER: Lorilee Sven SQUIBB, MD  END OF SESSION:   PT End of Session - 08/17/24 1526     Visit Number 34    Number of Visits 48    Date for Recertification  08/24/24    Authorization Type U[employer] auth: [id number] for 16 pt vst from 8/12-10/07 (9/24 is 7 of 16)    PT Start Time 1535    PT Stop Time 1615    PT Time Calculation (min) 40 min    Equipment Utilized During Treatment Gait belt;Other (comment)   L LE AFO   Activity Tolerance Patient tolerated treatment well    Behavior During Therapy [facility] for tasks assessed/performed            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   Stroke (HCC)    RICA, R A1, R MCA occlusion 07/19/19   Past Surgical History:  Procedure Laterality Date   CRANIOPLASTY Right 06/05/2020   Procedure: CRANIOPLASTY;  Surgeon: Lanis Pupa, MD;  Location: [facility] OR;  Service: Neurosurgery;  Laterality: Right;  right   CRANIOTOMY Right 07/19/2019   Procedure: RIGHT HEMI-CRANIECTOMY With implantation of skull flap to abdominal wall;  Surgeon: Lanis Pupa, MD;  Location[facility] OR;  Service: Neurosurgery;  Laterality: Right;   CYST EXCISION N/A 10/08/2016   Procedure: EXCISION OF POSTERIOR NECK CYST;  Surgeon: Mitzie DELENA Freund, MD;  Location: [facility] ORS;  Service: General;  Laterality: N/A;   CYSTOSCOPY/URETEROSCOPY/HOLMIUM LASER/STENT PLACEMENT Right 03/16/2020   Procedure:  CYSTOSCOPY RIGHT RETROGRADE PYELOGRAM URETEROSCOPY/HOLMIUM LASER/STENT PLACEMENT;  Surgeon: Carolee Sherwood JONETTA DOUGLAS, MD;  Location: [facility] ORS;  Service: Urology;  Laterality: Right;   INCISION AND DRAINAGE ABSCESS N/A 09/22/2014   Procedure: INCISION AND DRAINAGE ABSCESS POSTERIOR NECK;  Surgeon: Donnice KATHEE Lunger, MD;  Location: [facility] ORS;  Service: General;  Laterality: N/A;   INCISION AND DRAINAGE ABSCESS N/A 12/20/2015   Procedure: INCISION AND DRAINAGE POSTERIOR NECK MASS;  Surgeon: Krystal Spinner, MD;  Location: [facility] ORS;  Service: General;  Laterality: N/A;   INCISION AND DRAINAGE ABSCESS Left 07/10/2004   middle finger   IR IVC FILTER PLMT / S&I /IMG GUID/MOD SED  07/21/2019   IR RADIOLOGIST EVAL & MGMT  12/14/2019   IR RADIOLOGIST EVAL & MGMT  12/21/2020   IR VENOGRAM RENAL UNI RIGHT  07/21/2019   MASS EXCISION N/A 07/21/2017   Procedure: EXCISION OF BENIGN NECK LESION WITH LAYERED CLOSURE;  Surgeon: Arelia Filippo, MD;  Location: [facility];  Service: Plastics;  Laterality: N/A;   MASS EXCISION N/A 11/10/2017   Procedure: EXCISION BENIGN LESION OF THE NECK WITH LAYERED CLOSURE;  Surgeon: Arelia Filippo, MD;  Location: [facility];  Service: Plastics;  Laterality: N/A;   Patient Active Problem List   Diagnosis Date Noted   Hx of adenomatous colonic polyps 06/01/2023   COVID-19 virus infection 04/21/2021  Left-sided weakness 04/21/2021   S/P craniotomy 06/05/2020   History of cranioplasty 06/05/2020   Peri-rectal abscess 02/14/2020   Abnormal CT scan, pelvis 02/14/2020   Pancytopenia (HCC) 12/02/2019   Nephrolithiasis 12/02/2019   Hydronephrosis with renal and ureteral calculus obstruction 12/02/2019   Rectal pain 12/02/2019   Hyperkalemia 12/02/2019   Reactive depression    Wound infection after surgery    Sleep disturbance    Dysphagia, post-stroke    Transaminitis    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   Cerebral abscess    Urinary tract  infection without hematuria    Altered mental status    Primary hypercoagulable state    Acute pulmonary embolism without acute cor pulmonale (HCC)    Deep vein thrombosis (DVT) of non-extremity vein    Hypokalemia    Acute blood loss anemia    Leukocytosis    Endotracheal tube present    Acute respiratory failure with hypoxemia (HCC)    Stroke (cerebrum) (HCC) 07/19/2019   Pressure injury of skin 07/19/2019   Acute CVA (cerebrovascular accident) (HCC)    Encephalopathy    Dysphagia    Acute encephalopathy    Essential hypertension    Obesity 03/12/2016   Scalp abscess 12/20/2015   Neck abscess 12/20/2015   Pilonidal cyst 02/08/2013    ONSET DATE: stroke in August 2020  REFERRING DIAG: I63.9 (ICD-10-CM) - Cerebrovascular accident (CVA), unspecified mechanism (HCC)   THERAPY DIAG:   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)  Muscle weakness (generalized)  Hemiplegia and hemiparesis following cerebral infarction affecting left non-dominant side (HCC)  Other lack of coordination  Unsteadiness on feet  Other abnormalities of gait and mobility  Other symptoms and signs involving the nervous system   Rationale for Evaluation and Treatment: Rehabilitation  SUBJECTIVE:                                                                                                                                                                                             SUBJECTIVE STATEMENT:  Pt reports his L shoulder is still hurting. States but I can deal with it. Pt states he called to ask if he could get an appointment to see PMR physician sooner, but due to insurance limitations he is unable to receive the botox  injections sooner. Pt states he felt wore out after participating in gait training on treadmill during last therapy session; however, pt denies leg soreness. Denies other pain. Denies stumbles/falls.   Of note: Botox  in L LE  biceps femoris on 06/24/2024 Has the Motus Nova Foot device  Initial Eval: Patient reports his goal is to walk without using a cane and without assistance. Patient states he wants to walk like he did before having the stroke. Reports his stroke was in August of 2020. States he currently uses a quad cane for ambulation and tries to walk as much as possible. Reports he has a manual wheelchair, but states he tries not to use it because it depresses him and makes him feel like he is back to when he first had his stroke. Reports wearing L LE custom AFO at all times.   Reports he is currently doing everything mod-I, but states he could call his mom, dad, or brothers if he needed assistance.  Reports he has routine botox  injections in L UE to help manage tone, but doesn't receive any in his LE.  Reports he has a hospital bed, but sleeps in a regular bed.  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  Etiology of carotid occlusion unclear possibly dissection.  He had a prolonged hospital admission with several complications including DVT, pulmonary embolism, hemorrhagic transformation, abdominal wall hematoma, UTI but made quite remarkable recovery   PAIN:  Are you having pain? No, denies pain today, but states sometimes will have L shoulder pain  PRECAUTIONS: Fall  RED FLAGS: None   WEIGHT BEARING RESTRICTIONS: No  FALLS: Has patient fallen in last 6 months? Yes, stating he fell down his stairs ~5 months ago and is now scared to perform stair navigation  LIVING  ENVIRONMENT: Lives with: lives alone Lives in: House/apartment Stairs: Yes: Internal: flight steps; can reach both and External: 4 steps; can reach both, but states he doesn't have to go upstairs Has following equipment at home: Quad cane small base, Hemi walker, Wheelchair (manual), shower chair, Shower bench, and L LE custom AFO Pt has custom K5 wheelchair with rigid, contoured back support and Roho cushion. Pt reports he calls [employer] to come adjust his seat cushion when he feels it is getting uncomfortable.  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  DIAGNOSTIC FINDINGS:  EXAM: CT ANGIOGRAPHY HEAD AND NECK WITH AND WITHOUT CONTRAST IMPRESSION: 1. No acute intracranial abnormality. 2. Chronically occluded right ICA with minimal opacification of the right MCA and ACA branches. 3. Redemonstrated extensive encephalomalacia in the right MCA and ACA territories secondary to a prior infarct.  Electronically Signed   By: Lyndall Gore M.D.   On: 11/27/2023 13:45  COGNITION: Overall cognitive status: Within functional limits for tasks assessed  VISION (03/29/2024): Therapist performed visual field screen with pt appearing to have L homonomous hemianopsia - pt confirms he has loss of vision on L side.   SENSATION: Light touch: Impaired in L LE Proprioception: Impaired  in L LE  Can feel deep  pressure in L LE   COORDINATION: Impaired in L LE due to paresis, hypertonia, and overall decreased flexibility  EDEMA:  Not formally  assessed  MUSCLE TONE: LLE: Mild, Moderate, and Hypertonic  MUSCLE LENGTH: Not formally assessed; however, demonstrates L hamstring muscle tightness  DTRs:  Not formally assessed  POSTURE: rounded shoulders, forward head, and weight shift right  LOWER EXTREMITY ROM:       Right Eval Active ROM Left Eval Passive ROM  Hip flexion WFL Achieves at least 90  Hip extension    Hip abduction    Hip adduction    Hip internal rotation  Unable to achieve neutral hip rotation, lacking internal rotation  Hip external rotation  [facility] and rests in excessive ER  Knee flexion [facility]  Knee extension WFL Able to achieve terminal knee extension in supine, but with significant discomfort due to hamstring tone  Ankle dorsiflexion WFL Did not remove AFO to assess this date  Ankle plantarflexion WFL Did not remove AFO to assess this date  Ankle inversion    Ankle eversion     (Blank rows = not tested)  LOWER EXTREMITY MMT:    MMT Right Eval Left Eval  Hip flexion 4+ 2-  (compensates with hip adductor activation)  Hip extension    Hip abduction    Hip adduction  3+  Hip internal rotation  2-  Hip external rotation  2  Knee flexion 4+ 1  Knee extension 4+ 3+  Ankle dorsiflexion 4+ Did not remove AFO to assess this date  Ankle plantarflexion 4+ Did not remove AFO to assess this date  Ankle inversion    Ankle eversion    (Blank rows = not tested)  Manual Muscle Test Scale 0/5 = No muscle contraction can be seen or felt 1/5 = Contraction can be felt, but there is no motion 2-/5 = Part moves through incomplete ROM w/ gravity decreased 2/5 = Part moves through complete ROM w/ gravity decreased 2+/5 = Part moves through incomplete ROM (<50%) against gravity or through complete ROM w/ gravity 3-/5 = Part moves through incomplete ROM (>50%) against gravity 3/5 = Part moves through complete ROM against gravity 3+/5 = Part moves through complete ROM against gravity/slight resistance 4-/5=  Holds test position against slight to moderate pressure 4/5 = Part moves through complete ROM against gravity/moderate resistance 4+/5= Holds test position against moderate to strong pressure 5/5 = Part moves through complete ROM against gravity/full resistance  BED MOBILITY: (sleeps in regular bed, but has hospital bed available) Findings: Sit to supine CGA Supine to sit Min A and Mod A Rolling to Right Mod A Rolling to Left Min A and Mod A  TRANSFERS: Sit to stand: CGA and Min A  Assistive device utilized: Counselling psychologist     Stand to sit: CGA and Min A  Assistive device utilized: Tree surgeon to chair: Min A  Assistive device utilized: Counselling psychologist       RAMP:  Not tested  CURB:  Not tested  STAIRS: Not tested *need to assess*  GAIT: Findings: advances L LE with excessive hip external rotation using hip adductors, step to pattern, decreased arm swing- Left, decreased step length- Right, decreased stance time- Left, decreased stride length, decreased hip/knee flexion- Left, decreased ankle dorsiflexion- Left, lateral lean- Right, and decreased trunk rotation Distance walked: ~37ft Assistive device utilized: small based quad cane in R UE  Level of  assistance: CGA and Min A  FUNCTIONAL TESTS:  5 times sit to stand: need to assess Timed up and go (TUG): need to assess 6 minute walk test: need to assess 10 meter walk test: 0.66m/s using small based quad cane and CGA Berg Balance Scale: need to assess  PATIENT SURVEYS:  Stroke Impact Scale 03/15/2024: 49/80                                                                                                                               TREATMENT DATE: 08/17/2024  Gait into therapy clinic using [facility] mod-I with only slight step-through pattern leading with L LE.  Donned litegait harness with R UE support on litegait handle.  HR goal: 140bpm for 80% estimated HR max   HR 78bpm in standing on  treadmill to start  Gait training the following trials on treadmill in litegait harness, not providing BWS, only used for balance: *Pt using R UE support on litegait handle throughout* 02 sec at 0. increased to 0.8-0.72mph, totaling 182ft Pt with 1x L foot stepping on side runner of treadmill causing LOB/tripping so had to stop and take standing reset before continuing - around ~25min20sec Pt advancing L LE during swing independently today, until after tripping causing LOB, then requires min manual facilitation to advance fully and increase pt's confidence  HR 92bpm, Borg RPE 14/20 Seated rest break - HR recovered to 73bpm  33 sec, starting at 0. increasing to 0.78mph after 45seconds, totaling 151ft Continues to have excessive L LE hip abduction/circumduction with excessive hip ER causing him to frequently scrub toes along L side of treadmill runner Cuing throughout to stay to R side of treadmill Requires min manual facilitation to correct L foot placement and prevent LOB when this happens Pt states he is relying too much on his R hand for support during L stance phase, causing his R hand to get tired before everything else HR 94bpm, Borg RPE 19/20 Seated rest break, HR recovers to 68bpm , 0.65mph increasing to 0.61mph within 20 seconds, totaling 172ft +2 A behind patient to facilitate weight shift onto L LE stance phase to decrease reliance on R UE support HR 88 bpm, Borg RPE 17/20  Patient will benefit from continued use of +2 A to facilitate weight shift onto L stance limb, as well as potential benefit from increased harness support to allow pt to feel confident with decreasing support through R UE.  Stepped off treadmill in litegait harness for balance safety and therapist providing min manual facilitation for L LE management.   Transported back upstairs in transport chair to end session.   PATIENT EDUCATION: Education details: Therapy POC, LTGs, HEP, importance of  improving L LE flexibility, findings during assessment  Person educated: Patient Education method: Explanation and Handouts Education comprehension: verbalized understanding and needs further education  HOME EXERCISE PROGRAM:  Access Code: [email or web address] URL: [email or web address] Date: 03/10/2024 Prepared by: Connell Kiss  Exercises -  Seated Hip Adduction Isometrics with Ball  - 1 x daily - 7 x weekly - 2 sets - 10 reps - 5 seconds hold - Supine Quadricep Sets  - 1 x daily - 7 x weekly - 2 sets - 10 reps - 3 seconds hold Use of Motus Nova Foot device for at least 1hour every day  GOALS: Goals reviewed with patient? Yes  SHORT TERM GOALS: Target date: 07/13/2024  Patient will be independent in home exercise program to improve strength/mobility for better functional independence with ADLs.  Baseline: initiated on 03/10/2024 04/14/2024: continued Goal status: IN PROGRESS   LONG TERM GOALS: Target date: 08/24/2024  Patient (< 46 years old) will complete five times sit to stand (5XSTS) test in < 10 seconds indicating an increased LE strength and improved balance.  Baseline: 03/15/2024: 16.53 seconds from standard green chair relying on R hand to push-up from armrest and pt's personal SBQC nearby, but not using it VERSUS 25.85 seconds with R hand across chest, with min A for balance/safety without use of UE support 04/14/2024: 8.96 seconds using R UE support to push-up from armrest and pt's SBQC next to him, 13.75 seconds with R hand across chest with min A for therapist to stabilize chair behind him 05/30/24: 13.01 sec with R UE support on chair; 15.26 sec with R hand across chest and minA from therapist to stabilize chair behind pt 08/10/2024: 12.56 seconds using R UE support on chair armrest & 14.20 seconds with R UE across chest & chair against wall Goal status: IN PROGRESS  2.  Patient will increase Berg Balance score to > 45/56 to demonstrate improved balance and decreased fall  risk during functional activities and ADLs.  Baseline: 03/15/2024: 14/56 04/14/2024: 22/56 05/30/24: TBD at next visit 07/27/2024: 33/56 Goal status: IN PROGRESS  3.  Patient will increase 10 meter walk test to >1.42m/s using LRAD as to improve gait speed for better community ambulation and to reduce fall risk.  Baseline: 0.30 m/s using small based quad cane with CGA and 1x light min A due to balance instability  04/14/2024: 0.405 m/s using SBQC with close SBA for safety 05/30/24: 0.379 m/s using SBQC with close SBA for safety.  07/21/2024= 0.43 m/s with SBQC with close SBA Goal status: IN PROGRESS  4.  Patient will increase six minute walk test distance to >573ft for progression towards community ambulator and improve gait ability  Baseline: 228ft with [facility]  04/20/2024:  274ft with SBQC and SBA 05/30/24: 360 ft with [facility] 08/10/2024: 416 feet (126.80 meters, Avg speed 0.25m/s) using SBQC mod-I. Goal status: IN PROGRESS  5.  Patient will reduce timed up and go to <15 seconds to reduce fall risk and demonstrate improved transfer/gait ability. Baseline: 03/15/2024: 25.32 seconds using SBQC and chair with armrest as well as CGA for safety/steadying 04/14/2024: 22.25 seconds using SBQC & chair w/ armrest and close SBA for safety 05/30/24: 26.87 seconds w/ SBQC and use; 07/21/2024= 21.13 sec with SBQC and SBA 07/21/2024:  21.13 sec with SBQC and SBA  Goal status: IN PROGRESS  6.  Patient will ascend/descend 4 stairs, using railing, independently without loss of balance to improve ability to get in/out of home.   Baseline: 03/31/2024: using R UE support on each HR with skilled min assist for balance using primarily step-to pattern 04/20/2024:  using R UE support on each HR with skilled CGA for safety using step-to pattern 05/30/24: continued use of R UE support on HR with skilled CGA for  safety using step-to pattern 08/10/2024: R UE support on HR, step-to pattern, supervision only Goal status: IN  PROGRESS  ASSESSMENT:  CLINICAL IMPRESSION:  Therapy session focused on continuation of higher intensity gait training on treadmill with use of litegait harness for balance support and safety. Patient able to progress to consistent gait training at 0.11mph today for 3 bouts of >2 minutes! with only intermittent min manual facilitation for L LE management to avoid foot hitting the L lateral side runner on the treadmill due to pt having circumduction compensation during swing advancement and impaired midline orientation resulting in pt staying too far towards L sie of treadmill. Patient demonstrates increasing activity tolerance; however, does continue to demonstrate lack of L weight shift onto L LE stance phase, resulting in compensation through R UE support on litegait handle. Patient does report working at Borg RPE scale of 14-19/20 during session although minimal increase in his HR noted (pt does not appear to be on a beta blocker). Plan to continue HIGT on treadmill with use of +2 A to improve L wt shift during L stance as well as increase harness support to decrease reliance on R UE support. Patient remains highly motivated to participate in therapy and is eager to continue participating in HIGT on the treadmill, which is an intervention that has been shown to have significant impact on gait speed and endurance. Pt will continue to benefit from skilled therapy to address remaining deficits in order to improve overall QoL and return to PLOF.     OBJECTIVE IMPAIRMENTS: Abnormal gait, cardiopulmonary status limiting activity, decreased activity tolerance, decreased balance, decreased coordination, decreased endurance, decreased knowledge of use of DME, decreased mobility, difficulty walking, decreased ROM, decreased strength, hypomobility, impaired flexibility, impaired sensation, impaired tone, impaired UE functional use, improper body mechanics, postural dysfunction, and pain.   ACTIVITY LIMITATIONS:  carrying, lifting, bending, standing, squatting, sleeping, stairs, transfers, bed mobility, continence, bathing, toileting, dressing, reach over head, hygiene/grooming, locomotion level, and caring for others  PARTICIPATION LIMITATIONS: meal prep, cleaning, laundry, medication management, shopping, community activity, and yard work  PERSONAL FACTORS: Age, Past/current experiences, Time since onset of injury/illness/exacerbation, Transportation, and 3+ comorbidities: HTN, hx of kidney stones, depression are also affecting patient's functional outcome.   REHAB POTENTIAL: Good  CLINICAL DECISION MAKING: Evolving/moderate complexity  EVALUATION COMPLEXITY: Moderate  PLAN:  PT FREQUENCY: 1-2x/week  PT DURATION: 12 weeks  PLANNED INTERVENTIONS: [id number]- PT Re-evaluation, 97750- Physical Performance Testing, 97110-Therapeutic exercises, 97530- Therapeutic activity, [id number]- Neuromuscular re-education, 97535- Self Care, [id number]- Manual therapy, [id number]- Gait training, [phone]- Orthotic/Prosthetic subsequent, [phone]- Canalith repositioning, [phone]- Electrical stimulation (manual), Patient/Family education, Balance training, Stair training, Taping, Dry Needling, Joint mobilization, Joint manipulation, Vestibular training, Visual/preceptual remediation/compensation, DME instructions, Cryotherapy, Moist heat, and Biofeedback  PLAN FOR NEXT SESSION:   *HIGT on treadmill* Continue +2 A for L wt shift & increase harness support - goal to decrease reliance on R UE support Increasing speed and gradually work towards adding L LE AW or variable direction walking or no R UE support  Progress HEP  high intensity gait training using +2 HHA vs litegait support vs SBQC continue adding 7lb AW to L LE when appropriate vs decreasing RUE support/reliance while in litegait Backwards gait training - continue Stepping over obstacles - continue Continue stair training for L LE NMR For stance   R foot taps while sustaining L  hip extension For swing phase Reciprocal foot taps L LE stretching and weightbearing for tone management and to improve alignment during  gait/standing (avoid excessive hip ER) Non gait training ideas: bridges with R foot placed on unstable surface to promote increased L LE wt bearing    Amellia Panik, PT, DPT, NCS, CSRS Physical Therapist - [place]  [facility]  5:52 PM 08/17/24

## 2024-08-24 ENCOUNTER — Ambulatory Visit: Attending: Physical Medicine and Rehabilitation

## 2024-08-24 DIAGNOSIS — M25512 Pain in left shoulder: Secondary | ICD-10-CM | POA: Diagnosis present

## 2024-08-24 DIAGNOSIS — R2681 Unsteadiness on feet: Secondary | ICD-10-CM | POA: Insufficient documentation

## 2024-08-24 DIAGNOSIS — M24542 Contracture, left hand: Secondary | ICD-10-CM | POA: Diagnosis present

## 2024-08-24 DIAGNOSIS — I69354 Hemiplegia and hemiparesis following cerebral infarction affecting left non-dominant side: Secondary | ICD-10-CM | POA: Diagnosis present

## 2024-08-24 DIAGNOSIS — R29818 Other symptoms and signs involving the nervous system: Secondary | ICD-10-CM | POA: Diagnosis present

## 2024-08-24 DIAGNOSIS — M6281 Muscle weakness (generalized): Secondary | ICD-10-CM | POA: Diagnosis present

## 2024-08-24 DIAGNOSIS — R2689 Other abnormalities of gait and mobility: Secondary | ICD-10-CM | POA: Diagnosis present

## 2024-08-24 DIAGNOSIS — G8929 Other chronic pain: Secondary | ICD-10-CM | POA: Insufficient documentation

## 2024-08-24 DIAGNOSIS — R278 Other lack of coordination: Secondary | ICD-10-CM | POA: Diagnosis present

## 2024-08-24 DIAGNOSIS — R29898 Other symptoms and signs involving the musculoskeletal system: Secondary | ICD-10-CM | POA: Diagnosis present

## 2024-08-24 DIAGNOSIS — Z741 Need for assistance with personal care: Secondary | ICD-10-CM | POA: Diagnosis present

## 2024-08-24 NOTE — Therapy (Signed)
 OUTPATIENT PHYSICAL THERAPY NEURO TREATMENT/RECERT   Patient Name: Matthew Black MRN: [id number] DOB:[birth year], 48 y.o., male Today's Date: 08/25/2024   PCP: Dorina Loving, PA-C REFERRING PROVIDER: Lorilee Sven SQUIBB, MD  END OF SESSION:   PT End of Session - 08/24/24 1703     Visit Number 35    Number of Visits 59    Date for Recertification  11/16/24    Authorization Type [employer] auth: [id number] for 16 pt vst from 8/12-10/07 (9/24 is 7 of 16)    PT Start Time 1540    PT Stop Time 1624    PT Time Calculation (min) 44 min    Equipment Utilized During Treatment Gait belt;Other (comment)   L LE AFO   Activity Tolerance Patient tolerated treatment well    Behavior During Therapy [facility] for tasks assessed/performed            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   Stroke (HCC)    RICA, R A1, R MCA occlusion 07/19/19   Past Surgical History:  Procedure Laterality Date   CRANIOPLASTY Right 06/05/2020   Procedure: CRANIOPLASTY;  Surgeon: Lanis Pupa, MD;  Location: [facility] OR;  Service: Neurosurgery;  Laterality: Right;  right   CRANIOTOMY Right 07/19/2019   Procedure: RIGHT HEMI-CRANIECTOMY With implantation of skull flap to abdominal wall;  Surgeon: Lanis Pupa, MD;  Location: [facility] OR;  Service: Neurosurgery;  Laterality: Right;   CYST EXCISION N/A 10/08/2016   Procedure: EXCISION OF POSTERIOR NECK CYST;  Surgeon: Mitzie DELENA Freund, MD;  Location: [facility] ORS;  Service: General;  Laterality: N/A;   CYSTOSCOPY/URETEROSCOPY/HOLMIUM LASER/STENT PLACEMENT Right 03/16/2020    Procedure: CYSTOSCOPY RIGHT RETROGRADE PYELOGRAM URETEROSCOPY/HOLMIUM LASER/STENT PLACEMENT;  Surgeon: Carolee Sherwood JONETTA DOUGLAS, MD;  Location: [facility] ORS;  Service: Urology;  Laterality: Right;   INCISION AND DRAINAGE ABSCESS N/A 09/22/2014   Procedure: INCISION AND DRAINAGE ABSCESS POSTERIOR NECK;  Surgeon: Donnice KATHEE Lunger, MD;  Location: [facility]S;  Service: General;  Laterality: N/A;   INCISION AND DRAINAGE ABSCESS N/A 12/20/2015   Procedure: INCISION AND DRAINAGE POSTERIOR NECK MASS;  Surgeon: Krystal Spinner, MD;  Location: [facility] ORS;  Service: General;  Laterality: N/A;   INCISION AND DRAINAGE ABSCESS Left 07/10/2004   middle finger   IR IVC FILTER PLMT / S&I /IMG GUID/MOD SED  07/21/2019   IR RADIOLOGIST EVAL & MGMT  12/14/2019   IR RADIOLOGIST EVAL & MGMT  12/21/2020   IR VENOGRAM RENAL UNI RIGHT  07/21/2019   MASS EXCISION N/A 07/21/2017   Procedure: EXCISION OF BENIGN NECK LESION WITH LAYERED CLOSURE;  Surgeon: Arelia Filippo, MD;  Location: [facility];  Service: Plastics;  Laterality: N/A;   MASS EXCISION N/A 11/10/2017   Procedure: EXCISION BENIGN LESION OF THE NECK WITH LAYERED CLOSURE;  Surgeon: Arelia Filippo, MD;  Location: [facility];  Service: Plastics;  Laterality: N/A;   Patient Active Problem List   Diagnosis Date Noted   Hx of adenomatous colonic polyps 06/01/2023   COVID-19 virus infection 04/21/2021  Left-sided weakness 04/21/2021   S/P craniotomy 06/05/2020   History of cranioplasty 06/05/2020   Peri-rectal abscess 02/14/2020   Abnormal CT scan, pelvis 02/14/2020   Pancytopenia (HCC) 12/02/2019   Nephrolithiasis 12/02/2019   Hydronephrosis with renal and ureteral calculus obstruction 12/02/2019   Rectal pain 12/02/2019   Hyperkalemia 12/02/2019   Reactive depression    Wound infection after surgery    Sleep disturbance    Dysphagia, post-stroke    Transaminitis    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   Cerebral abscess     Urinary tract infection without hematuria    Altered mental status    Primary hypercoagulable state    Acute pulmonary embolism without acute cor pulmonale (HCC)    Deep vein thrombosis (DVT) of non-extremity vein    Hypokalemia    Acute blood loss anemia    Leukocytosis    Endotracheal tube present    Acute respiratory failure with hypoxemia (HCC)    Stroke (cerebrum) (HCC) 07/19/2019   Pressure injury of skin 07/19/2019   Acute CVA (cerebrovascular accident) (HCC)    Encephalopathy    Dysphagia    Acute encephalopathy    Essential hypertension    Obesity 03/12/2016   Scalp abscess 12/20/2015   Neck abscess 12/20/2015   Pilonidal cyst 02/08/2013    ONSET DATE: stroke in August 2020  REFERRING DIAG: I63.9 (ICD-10-CM) - Cerebrovascular accident (CVA), unspecified mechanism (HCC)   THERAPY DIAG:   Muscle weakness (generalized) - Plan: PT plan of care cert/re-cert  Hemiplegia and hemiparesis following cerebral infarction affecting left non-dominant side (HCC) - Plan: PT plan of care cert/re-cert  Other lack of coordination - Plan: PT plan of care cert/re-cert  Unsteadiness on feet - Plan: PT plan of care cert/re-cert  Other abnormalities of gait and mobility - Plan: PT plan of care cert/re-cert  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) - Plan: PT plan of care cert/re-cert  Other symptoms and signs involving the nervous system - Plan: PT plan of care cert/re-cert   Rationale for Evaluation and Treatment: Rehabilitation  SUBJECTIVE:                                                                                                                                                                                             SUBJECTIVE STATEMENT:  Pt reports his L foot is hurting with his AFO and requesting to take it off.    Of note: Botox  in L LE biceps femoris on 06/24/2024 Has the Motus Nova Foot device    Initial Eval:  Patient reports his goal  is to walk without using a cane and without assistance. Patient states he wants to walk like he did before having the stroke. Reports his stroke was in August of 2020. States he currently uses a quad cane for ambulation and tries to walk as much as possible. Reports he has a manual wheelchair, but states he tries not to use it because it depresses him and makes him feel like he is back to when he first had his stroke. Reports wearing L LE custom AFO at all times.   Reports he is currently doing everything mod-I, but states he could call his mom, dad, or brothers if he needed assistance.  Reports he has routine botox  injections in L UE to help manage tone, but doesn't receive any in his LE.  Reports he has a hospital bed, but sleeps in a regular bed.  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  Etiology of carotid occlusion unclear possibly dissection.  He had a prolonged hospital admission with several complications including DVT, pulmonary embolism, hemorrhagic transformation, abdominal wall hematoma, UTI but made quite remarkable recovery   PAIN:  Are you having pain? No, denies pain today, but states sometimes will have L shoulder pain  PRECAUTIONS: Fall  RED FLAGS: None   WEIGHT BEARING RESTRICTIONS: No  FALLS: Has patient fallen in last 6 months? Yes, stating he fell down his stairs ~5 months ago and is now scared to perform stair navigation  LIVING ENVIRONMENT: Lives with: lives alone Lives in: House/apartment Stairs: Yes: Internal:  flight steps; can reach both and External: 4 steps; can reach both, but states he doesn't have to go upstairs Has following equipment at home: Quad cane small base, Hemi walker, Wheelchair (manual), shower chair, Shower bench, and L LE custom AFO Pt has custom K5 wheelchair with rigid, contoured back support and Roho cushion. Pt reports he calls [employer] to come adjust his seat cushion when he feels it is getting uncomfortable.  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  DIAGNOSTIC FINDINGS:  EXAM: CT ANGIOGRAPHY HEAD AND NECK WITH AND WITHOUT CONTRAST IMPRESSION: 1. No acute intracranial abnormality. 2. Chronically occluded right ICA with minimal opacification of the right MCA and ACA branches. 3. Redemonstrated extensive encephalomalacia in the right MCA and ACA territories secondary to a prior infarct.  Electronically Signed   By: Lyndall Gore M.D.   On: 11/27/2023 13:45  COGNITION: Overall cognitive status: Within functional limits for tasks assessed  VISION (03/29/2024): Therapist performed visual field screen with pt appearing to have L homonomous hemianopsia - pt confirms he has loss of vision on L side.   SENSATION: Light touch: Impaired in L LE Proprioception: Impaired  in L LE  Can feel deep pressure in L LE  COORDINATION: Impaired in L LE due to paresis, hypertonia, and overall decreased flexibility  EDEMA:  Not formally assessed  MUSCLE TONE: LLE: Mild, Moderate, and Hypertonic  MUSCLE LENGTH: Not formally assessed;  however, demonstrates L hamstring muscle tightness  DTRs:  Not formally assessed  POSTURE: rounded shoulders, forward head, and weight shift right  LOWER EXTREMITY ROM:       Right Eval Active ROM Left Eval Passive ROM  Hip flexion WFL Achieves at least 90  Hip extension    Hip abduction    Hip adduction    Hip internal rotation  Unable to achieve neutral hip rotation, lacking internal rotation  Hip external rotation  [facility] and rests in excessive ER  Knee flexion [facility]  Knee extension WFL Able to achieve terminal knee extension in supine, but with significant discomfort due to hamstring tone  Ankle dorsiflexion WFL Did not remove AFO to assess this date  Ankle plantarflexion WFL Did not remove AFO to assess this date  Ankle inversion    Ankle eversion     (Blank rows = not tested)  LOWER EXTREMITY MMT:    MMT Right Eval Left Eval  Hip flexion 4+ 2-  (compensates with hip adductor activation)  Hip extension    Hip abduction    Hip adduction  3+  Hip internal rotation  2-  Hip external rotation  2  Knee flexion 4+ 1  Knee extension 4+ 3+  Ankle dorsiflexion 4+ Did not remove AFO to assess this date  Ankle plantarflexion 4+ Did not remove AFO to assess this date  Ankle inversion    Ankle eversion    (Blank rows = not tested)  Manual Muscle Test Scale 0/5 = No muscle contraction can be seen or felt 1/5 = Contraction can be felt, but there is no motion 2-/5 = Part moves through incomplete ROM w/ gravity decreased 2/5 = Part moves through complete ROM w/ gravity decreased 2+/5 = Part moves through incomplete ROM (<50%) against gravity or through complete ROM w/ gravity 3-/5 = Part moves through incomplete ROM (>50%) against gravity 3/5 = Part moves through complete ROM against gravity 3+/5 = Part moves through complete ROM against gravity/slight resistance 4-/5= Holds test position against slight to moderate pressure 4/5 = Part moves through complete ROM against  gravity/moderate resistance 4+/5= Holds test position against moderate to strong pressure 5/5 = Part moves through complete ROM against gravity/full resistance  BED MOBILITY: (sleeps in regular bed, but has hospital bed available) Findings: Sit to supine CGA Supine to sit Min A and Mod A Rolling to Right Mod A Rolling to Left Min A and Mod A  TRANSFERS: Sit to stand: CGA and Min A  Assistive device utilized: Counselling psychologist     Stand to sit: CGA and Min A  Assistive device utilized: Tree surgeon to chair: Min A  Assistive device utilized: Counselling psychologist       RAMP:  Not tested  CURB:  Not tested  STAIRS: Not tested *need to assess*  GAIT: Findings: advances L LE with excessive hip external rotation using hip adductors, step to pattern, decreased arm swing- Left, decreased step length- Right, decreased stance time- Left, decreased stride length, decreased hip/knee flexion- Left, decreased ankle dorsiflexion- Left, lateral lean- Right, and decreased trunk rotation Distance walked: ~30ft Assistive device utilized: small based quad cane in R UE  Level of assistance: CGA and Min A  FUNCTIONAL TESTS:  5 times sit to stand: need to assess Timed up and go (TUG): need to assess 6 minute walk test: need to assess 10 meter walk test: 0.43m/s using small based quad cane and CGA Berg Balance Scale: need to assess  PATIENT SURVEYS:  Stroke Impact Scale 03/15/2024: 49/80                                                                                                                               TREATMENT DATE: 08/25/2024  Patient was trained in placing his Left LE in and out of motus Nova as independently as possible - including wheeling up to device- positioning left LE into device and applying straps. Then he performed  taking device off as independently as possible. He repeated the process 6 times requiring increased time to complete- unsuccessful without  physical assist to position foot into device as the default position is in slight plantarflexion and patient unabel to keep foot in place- sliding down. On last 3 trials- utilized wedge under front of device to keep foot in more neutral position and this helped tremendously and he was able to position foot and even apply straps. Still significantly difficult due to nature of velcro and only able to use his right hand. He was able to successfully take the straps off and remove his foot from device on all trials (provided increased time)   Transported back upstairs in transport chair to end session.   PATIENT EDUCATION: Education details: Therapy POC, LTGs, HEP, importance of improving L LE flexibility, findings during assessment  Person educated: Patient Education method: Explanation and Handouts Education comprehension: verbalized understanding and needs further education  HOME EXERCISE PROGRAM:  Access Code: [email or web address] URL: [email or web address] Date: 03/10/2024 Prepared by: Connell Kiss  Exercises - Seated Hip Adduction Isometrics with Ball  - 1 x daily - 7 x weekly - 2 sets - 10 reps - 5 seconds hold - Supine Quadricep Sets  - 1 x daily - 7 x weekly - 2 sets - 10 reps - 3 seconds hold Use of Motus Nova Foot device for at least 1hour every day  GOALS: Goals reviewed with patient? Yes  SHORT TERM GOALS: Target date: 07/13/2024  Patient will be independent in home exercise program to improve strength/mobility for better functional independence with ADLs.  Baseline: initiated on 03/10/2024 04/14/2024: continued Goal status: IN PROGRESS   LONG TERM GOALS: Target date: 11/16/2024  Patient (< 25 years old) will complete five times sit to stand (5XSTS) test in < 10 seconds indicating an increased LE strength and improved balance.  Baseline: 03/15/2024: 16.53 seconds from standard green chair relying on R hand to push-up from armrest and pt's personal SBQC nearby, but not using  it VERSUS 25.85 seconds with R hand across chest, with min A for balance/safety without use of UE support 04/14/2024: 8.96 seconds using R UE support to push-up from armrest and pt's SBQC next to him,  13.75 seconds with R hand across chest with min A for therapist to stabilize chair behind him 05/30/24: 13.01 sec with R UE support on chair; 15.26 sec with R hand across chest and minA from therapist to stabilize chair behind pt 08/10/2024: 12.56 seconds using R UE support on chair armrest & 14.20 seconds with R UE across chest & chair against wall Goal status: IN PROGRESS  2.  Patient will increase Berg Balance score to > 45/56 to demonstrate improved balance and decreased fall risk during functional activities and ADLs.  Baseline: 03/15/2024: 14/56 04/14/2024: 22/56 05/30/24: TBD at next visit 07/27/2024: 33/56 Goal status: IN PROGRESS  3.  Patient will increase 10 meter walk test to >1.17m/s using LRAD as to improve gait speed for better community ambulation and to reduce fall risk.  Baseline: 0.30 m/s using small based quad cane with CGA and 1x light min A due to balance instability  04/14/2024: 0.405 m/s using SBQC with close SBA for safety 05/30/24: 0.379 m/s using SBQC with close SBA for safety.  07/21/2024= 0.43 m/s with SBQC with close SBA Goal status: IN PROGRESS  4.  Patient will increase six minute walk test distance to >511ft for progression towards community ambulator and improve gait ability  Baseline: 269ft with [facility]  04/20/2024:  255ft with SBQC and SBA 05/30/24: 360 ft with [facility] 08/10/2024: 416 feet (126.80 meters, Avg speed 0.56m/s) using SBQC mod-I. Goal status: IN PROGRESS  5.  Patient will reduce timed up and go to <15 seconds to reduce fall risk and demonstrate improved transfer/gait ability. Baseline: 03/15/2024: 25.32 seconds using SBQC and chair with armrest as well as CGA for safety/steadying 04/14/2024: 22.25 seconds using SBQC & chair w/ armrest and close SBA for safety 05/30/24:  26.87 seconds w/ SBQC and use; 07/21/2024= 21.13 sec with SBQC and SBA 07/21/2024:  21.13 sec with SBQC and SBA  Goal status: IN PROGRESS  6.  Patient will ascend/descend 4 stairs, using railing, independently without loss of balance to improve ability to get in/out of home.   Baseline: 03/31/2024: using R UE support on each HR with skilled min assist for balance using primarily step-to pattern 04/20/2024:  using R UE support on each HR with skilled CGA for safety using step-to pattern 05/30/24: continued use of R UE support on HR with skilled CGA for safety using step-to pattern 08/10/2024: R UE support on HR, step-to pattern, supervision only Goal status: IN PROGRESS  ASSESSMENT:  CLINICAL IMPRESSION:  Patient presented for recert visit today  but arrived at wrong time but was able to switch PT and be seen by author. He presented with increased pain with AFO on Left LE and requested to take off. Unable to reassess mobility goals without AFO plus patient also was just reassessed 2 weeks ago. Today focused on problem solving assisting the patient in being more independent placing foot in/out of Motus Nova (which he has at home but not using much). He worked hard throughout session - eventually requiring a wedge under equipment to position foot correctly. He was able to operate straps with increased time and doff  independently and only last 3 attempts able to don the equipment. Pt will continue to benefit from skilled therapy to address remaining deficits in order to improve overall QoL and return to PLOF.     OBJECTIVE IMPAIRMENTS: Abnormal gait, cardiopulmonary status limiting activity, decreased activity tolerance, decreased balance, decreased coordination, decreased endurance, decreased knowledge of use of DME, decreased mobility, difficulty walking, decreased ROM,  decreased strength, hypomobility, impaired flexibility, impaired sensation, impaired tone, impaired UE functional use, improper body  mechanics, postural dysfunction, and pain.   ACTIVITY LIMITATIONS: carrying, lifting, bending, standing, squatting, sleeping, stairs, transfers, bed mobility, continence, bathing, toileting, dressing, reach over head, hygiene/grooming, locomotion level, and caring for others  PARTICIPATION LIMITATIONS: meal prep, cleaning, laundry, medication management, shopping, community activity, and yard work  PERSONAL FACTORS: Age, Past/current experiences, Time since onset of injury/illness/exacerbation, Transportation, and 3+ comorbidities: HTN, hx of kidney stones, depression are also affecting patient's functional outcome.   REHAB POTENTIAL: Good  CLINICAL DECISION MAKING: Evolving/moderate complexity  EVALUATION COMPLEXITY: Moderate  PLAN:  PT FREQUENCY: 1-2x/week  PT DURATION: 12 weeks  PLANNED INTERVENTIONS: [id number]- PT Re-evaluation, 97750- Physical Performance Testing, 97110-Therapeutic exercises, 97530- Therapeutic activity, [id number]- Neuromuscular re-education, 97535- Self Care, [id number]- Manual therapy, [id number]- Gait training, [phone]- Orthotic/Prosthetic subsequent, [phone]- Canalith repositioning, [phone]- Electrical stimulation (manual), Patient/Family education, Balance training, Stair training, Taping, Dry Needling, Joint mobilization, Joint manipulation, Vestibular training, Visual/preceptual remediation/compensation, DME instructions, Cryotherapy, Moist heat, and Biofeedback  PLAN FOR NEXT SESSION:   *HIGT on treadmill* Continue +2 A for L wt shift & increase harness support - goal to decrease reliance on R UE support Increasing speed and gradually work towards adding L LE AW or variable direction walking or no R UE support  Progress HEP  high intensity gait training using +2 HHA vs litegait support vs SBQC continue adding 7lb AW to L LE when appropriate vs decreasing RUE support/reliance while in litegait Backwards gait training - continue Stepping over obstacles - continue Continue stair  training for L LE NMR For stance   R foot taps while sustaining L hip extension For swing phase Reciprocal foot taps L LE stretching and weightbearing for tone management and to improve alignment during gait/standing (avoid excessive hip ER) Non gait training ideas: bridges with R foot placed on unstable surface to promote increased L LE wt bearing    Chyrl London, PT Physical Therapist - [facility]  [facility]  10:26 PM 08/25/24

## 2024-08-29 ENCOUNTER — Ambulatory Visit: Admitting: Physical Therapy

## 2024-08-29 DIAGNOSIS — R2689 Other abnormalities of gait and mobility: Secondary | ICD-10-CM

## 2024-08-29 DIAGNOSIS — R278 Other lack of coordination: Secondary | ICD-10-CM

## 2024-08-29 DIAGNOSIS — R2681 Unsteadiness on feet: Secondary | ICD-10-CM

## 2024-08-29 DIAGNOSIS — M6281 Muscle weakness (generalized): Secondary | ICD-10-CM | POA: Diagnosis not present

## 2024-08-29 DIAGNOSIS — I69354 Hemiplegia and hemiparesis following cerebral infarction affecting left non-dominant side: Secondary | ICD-10-CM

## 2024-08-29 DIAGNOSIS — R29818 Other symptoms and signs involving the nervous system: Secondary | ICD-10-CM

## 2024-08-29 NOTE — Therapy (Signed)
 OUTPATIENT PHYSICAL THERAPY NEURO TREATMENT  Patient Name: Matthew Black MRN: [id number] DOB:[birth year], 48 y.o., male Today's Date: 08/29/2024   PCP: Dorina Loving, PA-C REFERRING PROVIDER: Lorilee Sven SQUIBB, MD  END OF SESSION:   PT End of Session - 08/29/24 1619     Visit Number 36    Number of Visits 59    Date for Recertification  11/16/24    Authorization Type [employer] auth: [id number] for 16 pt vst from 8/12-10/07 (10/6 is 9 of 16)    PT Start Time 1618    PT Stop Time 1703    PT Time Calculation (min) 45 min    Equipment Utilized During Treatment Gait belt;Other (comment)   L LE AFO   Activity Tolerance Patient tolerated treatment well    Behavior During Therapy [facility] for tasks assessed/performed             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   Stroke (HCC)    RICA, R A1, R MCA occlusion 07/19/19   Past Surgical History:  Procedure Laterality Date   CRANIOPLASTY Right 06/05/2020   Procedure: CRANIOPLASTY;  Surgeon: Lanis Pupa, MD;  Location: [facility] OR;  Service: Neurosurgery;  Laterality: Right;  right   CRANIOTOMY Right 07/19/2019   Procedure: RIGHT HEMI-CRANIECTOMY With implantation of skull flap to abdominal wall;  Surgeon: Lanis Pupa, MD;  Location: [facility] OR;  Service: Neurosurgery;  Laterality: Right;   CYST EXCISION N/A 10/08/2016   Procedure: EXCISION OF POSTERIOR NECK CYST;  Surgeon: Mitzie DELENA Freund, MD;  Location: [facility] ORS;  Service: General;  Laterality: N/A;   CYSTOSCOPY/URETEROSCOPY/HOLMIUM LASER/STENT PLACEMENT Right 03/16/2020   Procedure:  CYSTOSCOPY RIGHT RETROGRADE PYELOGRAM URETEROSCOPY/HOLMIUM LASER/STENT PLACEMENT;  Surgeon: Carolee Sherwood JONETTA DOUGLAS, MD;  Location: [facility]S;  Service: Urology;  Laterality: Right;   INCISION AND DRAINAGE ABSCESS N/A 09/22/2014   Procedure: INCISION AND DRAINAGE ABSCESS POSTERIOR NECK;  Surgeon: Donnice KATHEE Lunger, MD;  Location: [facility];  Service: General;  Laterality: N/A;   INCISION AND DRAINAGE ABSCESS N/A 12/20/2015   Procedure: INCISION AND DRAINAGE POSTERIOR NECK MASS;  Surgeon: Krystal Spinner, MD;  Location: [facility];  Service: General;  Laterality: N/A;   INCISION AND DRAINAGE ABSCESS Left 07/10/2004   middle finger   IR IVC FILTER PLMT / S&I /IMG GUID/MOD SED  07/21/2019   IR RADIOLOGIST EVAL & MGMT  12/14/2019   IR RADIOLOGIST EVAL & MGMT  12/21/2020   IR VENOGRAM RENAL UNI RIGHT  07/21/2019   MASS EXCISION N/A 07/21/2017   Procedure: EXCISION OF BENIGN NECK LESION WITH LAYERED CLOSURE;  Surgeon: Arelia Filippo, MD;  Location: [facility];  Service: Plastics;  Laterality: N/A;   MASS EXCISION N/A 11/10/2017   Procedure: EXCISION BENIGN LESION OF THE NECK WITH LAYERED CLOSURE;  Surgeon: Arelia Filippo, MD;  Location: [facility];  Service: Plastics;  Laterality: N/A;   Patient Active Problem List   Diagnosis Date Noted   Hx of adenomatous colonic polyps 06/01/2023   COVID-19 virus infection 04/21/2021  Left-sided weakness 04/21/2021   S/P craniotomy 06/05/2020   History of cranioplasty 06/05/2020   Peri-rectal abscess 02/14/2020   Abnormal CT scan, pelvis 02/14/2020   Pancytopenia (HCC) 12/02/2019   Nephrolithiasis 12/02/2019   Hydronephrosis with renal and ureteral calculus obstruction 12/02/2019   Rectal pain 12/02/2019   Hyperkalemia 12/02/2019   Reactive depression    Wound infection after surgery    Sleep disturbance    Dysphagia, post-stroke    Transaminitis    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   Cerebral abscess    Urinary tract  infection without hematuria    Altered mental status    Primary hypercoagulable state    Acute pulmonary embolism without acute cor pulmonale (HCC)    Deep vein thrombosis (DVT) of non-extremity vein    Hypokalemia    Acute blood loss anemia    Leukocytosis    Endotracheal tube present    Acute respiratory failure with hypoxemia (HCC)    Stroke (cerebrum) (HCC) 07/19/2019   Pressure injury of skin 07/19/2019   Acute CVA (cerebrovascular accident) (HCC)    Encephalopathy    Dysphagia    Acute encephalopathy    Essential hypertension    Obesity 03/12/2016   Scalp abscess 12/20/2015   Neck abscess 12/20/2015   Pilonidal cyst 02/08/2013    ONSET DATE: stroke in August 2020  REFERRING DIAG: I63.9 (ICD-10-CM) - Cerebrovascular accident (CVA), unspecified mechanism (HCC)   THERAPY DIAG:   Hemiplegia and hemiparesis following cerebral infarction affecting left non-dominant side (HCC)  Muscle weakness (generalized)  Other lack of coordination  Unsteadiness on feet  Other abnormalities of gait and mobility  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)  Other symptoms and signs involving the nervous system   Rationale for Evaluation and Treatment: Rehabilitation  SUBJECTIVE:                                                                                                                                                                                             SUBJECTIVE STATEMENT:  Pt reports he has been doing good. Pt states somehow his L AFO strap got twisted, but he has been able to fix it (no observable twist from therapist at this time). Pt states he has been trying to find a book to prop the front of the Motus Nova Foot device up on, to simulate the wedge he was using during last therapy session to allow him increased independence with donning/doffing the device, but states he hasn't found one yet. Denies stumbles/falls. Pt states  he has a little pain, but I'm going  to work through it regarding his L LE AFO and shoes rated as 5/10.   Pt states he is trying to go to store to try on new shoes before ordering them to know that they will work and fit appropriately with his AFO.  Of note: Botox  in L LE biceps femoris on 06/24/2024 Has the Motus Nova Foot device    Initial Eval: Patient reports his goal is to walk without using a cane and without assistance. Patient states he wants to walk like he did before having the stroke. Reports his stroke was in August of 2020. States he currently uses a quad cane for ambulation and tries to walk as much as possible. Reports he has a manual wheelchair, but states he tries not to use it because it depresses him and makes him feel like he is back to when he first had his stroke. Reports wearing L LE custom AFO at all times.   Reports he is currently doing everything mod-I, but states he could call his mom, dad, or brothers if he needed assistance.  Reports he has routine botox  injections in L UE to help manage tone, but doesn't receive any in his LE.  Reports he has a hospital bed, but sleeps in a regular bed.  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  Etiology of carotid occlusion unclear possibly dissection.  He had a prolonged hospital admission with several complications including DVT, pulmonary embolism, hemorrhagic transformation, abdominal wall hematoma, UTI but made quite remarkable recovery   PAIN:  Are you having pain? No, denies pain today, but  states sometimes will have L shoulder pain  PRECAUTIONS: Fall  RED FLAGS: None   WEIGHT BEARING RESTRICTIONS: No  FALLS: Has patient fallen in last 6 months? Yes, stating he fell down his stairs ~5 months ago and is now scared to perform stair navigation  LIVING ENVIRONMENT: Lives with: lives alone Lives in: House/apartment Stairs: Yes: Internal: flight steps; can reach both and External: 4 steps; can reach both, but states he doesn't have to go upstairs Has following equipment at home: Quad cane small base, Hemi walker, Wheelchair (manual), shower chair, Shower bench, and L LE custom AFO Pt has custom K5 wheelchair with rigid, contoured back support and Roho cushion. Pt reports he calls [employer] to come adjust his seat cushion when he feels it is getting uncomfortable.  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  DIAGNOSTIC FINDINGS:  EXAM: CT ANGIOGRAPHY HEAD AND NECK WITH AND WITHOUT CONTRAST IMPRESSION: 1. No acute intracranial abnormality. 2. Chronically occluded right ICA with minimal opacification of the right MCA and ACA branches. 3. Redemonstrated extensive encephalomalacia in the right MCA and ACA territories secondary to a prior infarct.  Electronically Signed   By: Lyndall  Meade M.D.   On: 11/27/2023 13:45  COGNITION: Overall cognitive status: Within functional limits for tasks assessed  VISION (03/29/2024): Therapist performed visual field screen with pt appearing to have L homonomous hemianopsia - pt confirms he has  loss of vision on L side.   SENSATION: Light touch: Impaired in L LE Proprioception: Impaired  in L LE  Can feel deep pressure in L LE   COORDINATION: Impaired in L LE due to paresis, hypertonia, and overall decreased flexibility  EDEMA:  Not formally assessed  MUSCLE TONE: LLE: Mild, Moderate, and Hypertonic  MUSCLE LENGTH: Not formally assessed; however, demonstrates L hamstring muscle tightness  DTRs:  Not formally assessed  POSTURE: rounded shoulders, forward head, and weight shift right  LOWER EXTREMITY ROM:       Right Eval Active ROM Left Eval Passive ROM  Hip flexion WFL Achieves at least 90  Hip extension    Hip abduction    Hip adduction    Hip internal rotation  Unable to achieve neutral hip rotation, lacking internal rotation  Hip external rotation  [facility] and rests in excessive ER  Knee flexion [facility]  Knee extension WFL Able to achieve terminal knee extension in supine, but with significant discomfort due to hamstring tone  Ankle dorsiflexion WFL Did not remove AFO to assess this date  Ankle plantarflexion WFL Did not remove AFO to assess this date  Ankle inversion    Ankle eversion     (Blank rows = not tested)  LOWER EXTREMITY MMT:    MMT Right Eval Left Eval  Hip flexion 4+ 2-  (compensates with hip adductor activation)  Hip extension    Hip abduction    Hip adduction  3+  Hip internal rotation  2-  Hip external rotation  2  Knee flexion 4+ 1  Knee extension 4+ 3+  Ankle dorsiflexion 4+ Did not remove AFO to assess this date  Ankle plantarflexion 4+ Did not remove AFO to assess this date  Ankle inversion    Ankle eversion    (Blank rows = not tested)  Manual Muscle Test Scale 0/5 = No muscle contraction can be seen or felt 1/5 = Contraction can be felt, but there is no motion 2-/5 = Part moves through incomplete ROM w/ gravity decreased 2/5 = Part moves through complete ROM w/ gravity decreased 2+/5 = Part moves through incomplete  ROM (<50%) against gravity or through complete ROM w/ gravity 3-/5 = Part moves through incomplete ROM (>50%) against gravity 3/5 = Part moves through complete ROM against gravity 3+/5 = Part moves through complete ROM against gravity/slight resistance 4-/5= Holds test position against slight to moderate pressure 4/5 = Part moves through complete ROM against gravity/moderate resistance 4+/5= Holds test position against moderate to strong pressure 5/5 = Part moves through complete ROM against gravity/full resistance  BED MOBILITY: (sleeps in regular bed, but has hospital bed available) Findings: Sit to supine CGA Supine to sit Min A and Mod A Rolling to Right Mod A Rolling to Left Min A and Mod A  TRANSFERS: Sit to stand: CGA and Min A  Assistive device utilized: Counselling psychologist     Stand to sit: CGA and Min A  Assistive device utilized: Tree surgeon to chair: Min A  Assistive device utilized: Counselling psychologist       RAMP:  Not tested  CURB:  Not tested  STAIRS: Not tested *need to  assess*  GAIT: Findings: advances L LE with excessive hip external rotation using hip adductors, step to pattern, decreased arm swing- Left, decreased step length- Right, decreased stance time- Left, decreased stride length, decreased hip/knee flexion- Left, decreased ankle dorsiflexion- Left, lateral lean- Right, and decreased trunk rotation Distance walked: ~65ft Assistive device utilized: small based quad cane in R UE  Level of assistance: CGA and Min A  FUNCTIONAL TESTS:  5 times sit to stand: need to assess Timed up and go (TUG): need to assess 6 minute walk test: need to assess 10 meter walk test: 0.20m/s using small based quad cane and CGA Berg Balance Scale: need to assess  PATIENT SURVEYS:  Stroke Impact Scale 03/15/2024: 49/80                                                                                                                                TREATMENT DATE: 08/29/2024  Gait into therapy clinic using [facility] mod-I with only slight step-through pattern leading with L LE.   Donned litegait harness with R UE support on litegait handle.   HR goal: 140bpm for 80% estimated HR max   HR 88bpm in standing on treadmill to start   Gait training the following trials on treadmill in litegait harness, not providing BWS, only used for balance: *Pt using R UE support on litegait handle throughout* at 0.6mph increased to 0.9mph within 30sec then to 1.2mph for the final 1 minute, totaling 162ft Pt more consistently stepping L foot on side runner of treadmill requiring therapist to manually facilitate L LE placement in more adducted positioning  Pt also continues to be too far towards L side of treadmill throughout due to midline orientation impairment Achieves reciprocal pattern throughout HR 108bpm, Borg RPE 14/20 Improved to 81bpm within 1 minute during seated rest break 30 sec, starting at 0.66mph increased to 1. within 45seconds, totaling 230ft Continues to have excessive L LE hip abduction/circumduction with excessive hip ER causing him to frequently scrub toes along L side of treadmill runner Cuing throughout to stay to R side of treadmill Therapist attempting to facilitate L wt shift onto L stance phase throughout, but would be better performed with +2 A  Requires min manual facilitation to correct L foot placement and prevent LOB due to this Pt states he didn't hold on with his R hand as much today because he didn't want his R hand to get sore HR 113bpm, Borg RPE 14/20 HR improves to 88bpm immediately upon seated rest break  Reports pain in L LE is still 5/10 after gait training  Patient will benefit from additional use of +2 A to facilitate weight shift onto L stance limb, as well as potential benefit from increased harness support to allow pt to feel confident with further decreasing support through R UE.   Stepped off  treadmill in litegait harness for balance safety and therapist providing min manual facilitation for L  LE management.  Transported pt up to entrance and pt able to mod-I transfer into his vehicle.   PATIENT EDUCATION: Education details: Therapy POC, LTGs, HEP, importance of improving L LE flexibility, findings during assessment  Person educated: Patient Education method: Explanation and Handouts Education comprehension: verbalized understanding and needs further education  HOME EXERCISE PROGRAM:  Access Code: [email or web address] URL: [email or web address] Date: 03/10/2024 Prepared by: Connell Kiss  Exercises - Seated Hip Adduction Isometrics with Ball  - 1 x daily - 7 x weekly - 2 sets - 10 reps - 5 seconds hold - Supine Quadricep Sets  - 1 x daily - 7 x weekly - 2 sets - 10 reps - 3 seconds hold Use of Motus Nova Foot device for at least 1hour every day  GOALS: Goals reviewed with patient? Yes  SHORT TERM GOALS: Target date: 07/13/2024  Patient will be independent in home exercise program to improve strength/mobility for better functional independence with ADLs.  Baseline: initiated on 03/10/2024 04/14/2024: continued Goal status: IN PROGRESS   LONG TERM GOALS: Target date: 11/16/2024  Patient (< 18 years old) will complete five times sit to stand (5XSTS) test in < 10 seconds indicating an increased LE strength and improved balance.  Baseline: 03/15/2024: 16.53 seconds from standard green chair relying on R hand to push-up from armrest and pt's personal SBQC nearby, but not using it VERSUS 25.85 seconds with R hand across chest, with min A for balance/safety without use of UE support 04/14/2024: 8.96 seconds using R UE support to push-up from armrest and pt's SBQC next to him, 13.75 seconds with R hand across chest with min A for therapist to stabilize chair behind him 05/30/24: 13.01 sec with R UE support on chair; 15.26 sec with R hand across chest and minA from therapist to  stabilize chair behind pt 08/10/2024: 12.56 seconds using R UE support on chair armrest & 14.20 seconds with R UE across chest & chair against wall Goal status: IN PROGRESS  2.  Patient will increase Berg Balance score to > 45/56 to demonstrate improved balance and decreased fall risk during functional activities and ADLs.  Baseline: 03/15/2024: 14/56 04/14/2024: 22/56 05/30/24: TBD at next visit 07/27/2024: 33/56 Goal status: IN PROGRESS  3.  Patient will increase 10 meter walk test to >1.20m/s using LRAD as to improve gait speed for better community ambulation and to reduce fall risk.  Baseline: 0.30 m/s using small based quad cane with CGA and 1x light min A due to balance instability  04/14/2024: 0.405 m/s using SBQC with close SBA for safety 05/30/24: 0.379 m/s using SBQC with close SBA for safety.  07/21/2024= 0.43 m/s with SBQC with close SBA Goal status: IN PROGRESS  4.  Patient will increase six minute walk test distance to >556ft for progression towards community ambulator and improve gait ability  Baseline: 230ft with [facility]  04/20/2024:  243ft with SBQC and SBA 05/30/24: 360 ft with [facility] 08/10/2024: 416 feet (126.80 meters, Avg speed 0.32m/s) using SBQC mod-I. Goal status: IN PROGRESS  5.  Patient will reduce timed up and go to <15 seconds to reduce fall risk and demonstrate improved transfer/gait ability. Baseline: 03/15/2024: 25.32 seconds using SBQC and chair with armrest as well as CGA for safety/steadying 04/14/2024: 22.25 seconds using SBQC & chair w/ armrest and close SBA for safety 05/30/24: 26.87 seconds w/ SBQC and use; 07/21/2024= 21.13 sec with SBQC and SBA 07/21/2024:  21.13 sec with SBQC and SBA  Goal status: IN PROGRESS  6.  Patient will ascend/descend 4 stairs, using railing, independently without loss of balance to improve ability to get in/out of home.   Baseline: 03/31/2024: using R UE support on each HR with skilled min assist for balance using primarily step-to  pattern 04/20/2024:  using R UE support on each HR with skilled CGA for safety using step-to pattern 05/30/24: continued use of R UE support on HR with skilled CGA for safety using step-to pattern 08/10/2024: R UE support on HR, step-to pattern, supervision only Goal status: IN PROGRESS  ASSESSMENT:  CLINICAL IMPRESSION:  Therapy session focused on continuation of higher intensity gait training on treadmill with use of litegait harness for balance support and safety. Patient able to progress to consistent gait training at 1.2mph today for 2 bouts of >/=2 minutes! with only min manual facilitation for L LE management to avoid foot hitting the L lateral side runner on the treadmill due to pt having circumduction compensation during swing advancement and impaired midline orientation resulting in pt staying too far towards L sie of treadmill. Patient demonstrates increasing activity tolerance; however, does continue to demonstrate lack of L weight shift onto L LE stance phase, resulting in compensation through R UE support on litegait handle. Patient does report working at Borg RPE scale of 14/20 during session with minimal increase his HR noted today, which quickly recovers during seated rest break. Plan to continue HIGT on treadmill with use of +2 A to improve L wt shift during L stance as well as increase harness support to decrease reliance on R UE support. Patient remains highly motivated to participate in therapy and is eager to continue participating in HIGT on the treadmill, which is an intervention that has been shown to have significant impact on gait speed and endurance. Pt will continue to benefit from skilled therapy to address remaining deficits in order to improve overall QoL and return to PLOF.    OBJECTIVE IMPAIRMENTS: Abnormal gait, cardiopulmonary status limiting activity, decreased activity tolerance, decreased balance, decreased coordination, decreased endurance, decreased knowledge of use  of DME, decreased mobility, difficulty walking, decreased ROM, decreased strength, hypomobility, impaired flexibility, impaired sensation, impaired tone, impaired UE functional use, improper body mechanics, postural dysfunction, and pain.   ACTIVITY LIMITATIONS: carrying, lifting, bending, standing, squatting, sleeping, stairs, transfers, bed mobility, continence, bathing, toileting, dressing, reach over head, hygiene/grooming, locomotion level, and caring for others  PARTICIPATION LIMITATIONS: meal prep, cleaning, laundry, medication management, shopping, community activity, and yard work  PERSONAL FACTORS: Age, Past/current experiences, Time since onset of injury/illness/exacerbation, Transportation, and 3+ comorbidities: HTN, hx of kidney stones, depression are also affecting patient's functional outcome.   REHAB POTENTIAL: Good  CLINICAL DECISION MAKING: Evolving/moderate complexity  EVALUATION COMPLEXITY: Moderate  PLAN:  PT FREQUENCY: 1-2x/week  PT DURATION: 12 weeks  PLANNED INTERVENTIONS: [id number]- PT Re-evaluation, 97750- Physical Performance Testing, 97110-Therapeutic exercises, 97530- Therapeutic activity, [id number]- Neuromuscular re-education, 97535- Self Care, [id number]- Manual therapy, [id number]- Gait training, [phone]- Orthotic/Prosthetic subsequent, [phone]- Canalith repositioning, [phone]- Electrical stimulation (manual), Patient/Family education, Balance training, Stair training, Taping, Dry Needling, Joint mobilization, Joint manipulation, Vestibular training, Visual/preceptual remediation/compensation, DME instructions, Cryotherapy, Moist heat, and Biofeedback  PLAN FOR NEXT SESSION:   *HIGT on treadmill* Continue +2 A for L wt shift & increase harness support - goal to decrease reliance on R UE support Increasing speed and gradually work towards adding L LE AW or variable direction walking or no R UE support  Progress HEP  high intensity gait training using +2 HHA vs litegait support vs  SBQC continue adding 7lb AW to L LE when appropriate vs decreasing RUE support/reliance while in litegait Backwards gait training - continue Stepping over obstacles - continue Continue stair training for L LE NMR For stance   R foot taps while sustaining L hip extension For swing phase Reciprocal foot taps L LE stretching and weightbearing for tone management and to improve alignment during gait/standing (avoid excessive hip ER) Non gait training ideas: bridges with R foot placed on unstable surface to promote increased L LE wt bearing    Teddi Badalamenti, PT, DPT, NCS, CSRS Physical Therapist - [place]  [facility]  5:13 PM 08/29/24

## 2024-08-29 NOTE — Therapy (Incomplete)
 OUTPATIENT PHYSICAL THERAPY NEURO TREATMENT  Patient Name: Matthew Black MRN: [id number] DOB:[birth year], 48 y.o., male Today's Date: 08/29/2024   PCP: Dorina Loving, PA-C REFERRING PROVIDER: Lorilee Sven SQUIBB, MD  END OF SESSION: ***      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   Stroke (HCC)    RICA, R A1, R MCA occlusion 07/19/19   Past Surgical History:  Procedure Laterality Date   CRANIOPLASTY Right 06/05/2020   Procedure: CRANIOPLASTY;  Surgeon: Lanis Pupa, MD;  Location: [facility] OR;  Service: Neurosurgery;  Laterality: Right;  right   CRANIOTOMY Right 07/19/2019   Procedure: RIGHT HEMI-CRANIECTOMY With implantation of skull flap to abdominal wall;  Surgeon: Lanis Pupa, MD;  Location: [facility] OR;  Service: Neurosurgery;  Laterality: Right;   CYST EXCISION N/A 10/08/2016   Procedure: EXCISION OF POSTERIOR NECK CYST;  Surgeon: Mitzie DELENA Freund, MD;  Location: [facility] ORS;  Service: General;  Laterality: N/A;   CYSTOSCOPY/URETEROSCOPY/HOLMIUM LASER/STENT PLACEMENT Right 03/16/2020   Procedure: CYSTOSCOPY RIGHT RETROGRADE PYELOGRAM URETEROSCOPY/HOLMIUM LASER/STENT PLACEMENT;  Surgeon: Carolee Sherwood JONETTA DOUGLAS, MD;  Location: [facility];  Service: Urology;  Laterality: Right;   INCISION AND DRAINAGE ABSCESS N/A 09/22/2014   Procedure: INCISION AND DRAINAGE ABSCESS POSTERIOR NECK;  Surgeon: Donnice KATHEE Lunger, MD;  Location: [facility];  Service: General;  Laterality: N/A;   INCISION AND DRAINAGE ABSCESS N/A 12/20/2015   Procedure: INCISION AND DRAINAGE POSTERIOR NECK MASS;  Surgeon: Krystal Spinner, MD;  Location: [facility];  Service: General;  Laterality: N/A;   INCISION AND DRAINAGE ABSCESS Left 07/10/2004   middle finger   IR IVC FILTER PLMT / S&I /IMG GUID/MOD SED  07/21/2019   IR RADIOLOGIST EVAL & MGMT  12/14/2019   IR RADIOLOGIST EVAL & MGMT  12/21/2020   IR VENOGRAM RENAL UNI RIGHT  07/21/2019   MASS EXCISION N/A 07/21/2017   Procedure: EXCISION OF BENIGN NECK LESION WITH LAYERED CLOSURE;  Surgeon: Arelia Filippo, MD;  Location: [facility];  Service: Plastics;  Laterality: N/A;   MASS EXCISION N/A 11/10/2017   Procedure: EXCISION BENIGN LESION OF THE NECK WITH LAYERED CLOSURE;  Surgeon: Arelia Filippo, MD;  Location: [facility];  Service: Plastics;  Laterality: N/A;   Patient Active Problem List   Diagnosis Date Noted   Hx of adenomatous colonic polyps 06/01/2023   COVID-19 virus infection 04/21/2021   Left-sided weakness 04/21/2021   S/P craniotomy 06/05/2020   History of cranioplasty 06/05/2020   Peri-rectal abscess 02/14/2020   Abnormal CT scan, pelvis 02/14/2020   Pancytopenia (HCC) 12/02/2019   Nephrolithiasis 12/02/2019   Hydronephrosis with renal and ureteral calculus obstruction 12/02/2019   Rectal pain 12/02/2019   Hyperkalemia 12/02/2019   Reactive depression    Wound infection after surgery    Sleep disturbance    Dysphagia, post-stroke    Transaminitis    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   Cerebral abscess    Urinary tract infection without hematuria    Altered mental status    Primary  hypercoagulable state    Acute pulmonary embolism without acute cor pulmonale (HCC)    Deep vein thrombosis (DVT) of non-extremity vein    Hypokalemia    Acute blood loss anemia    Leukocytosis    Endotracheal tube present    Acute respiratory failure with hypoxemia (HCC)    Stroke (cerebrum) (HCC) 07/19/2019   Pressure injury of skin 07/19/2019   Acute CVA (cerebrovascular accident) [facility])    Encephalopathy     Dysphagia    Acute encephalopathy    Essential hypertension    Obesity 03/12/2016   Scalp abscess 12/20/2015   Neck abscess 12/20/2015   Pilonidal cyst 02/08/2013    ONSET DATE: stroke in August 2020  REFERRING DIAG: I63.9 (ICD-10-CM) - Cerebrovascular accident (CVA), unspecified mechanism (HCC)   THERAPY DIAG:  *** No diagnosis found.   Rationale for Evaluation and Treatment: Rehabilitation  SUBJECTIVE:                                                                                                                                                                                             SUBJECTIVE STATEMENT:  *** today is 9 of 16 Any improved ability to manage L foot in/out motus nova device?  Pt reports his L foot is hurting with his AFO and requesting to take it off.    Of note: Botox  in L LE biceps femoris on 06/24/2024 Has the Motus Nova Foot device    Initial Eval: Patient reports his goal is to walk without using a cane and without assistance. Patient states he wants to walk like he did before having the stroke. Reports his stroke was in August of 2020. States he currently uses a quad cane for ambulation and tries to walk as much as possible. Reports he has a manual wheelchair, but states he tries not to use it because it depresses him and makes him feel like he is back to when he first had his stroke. Reports wearing L LE custom AFO at all times.   Reports he is currently doing everything mod-I, but states he could call his mom, dad, or brothers if he needed assistance.  Reports he has routine botox  injections in L UE to help manage tone, but doesn't receive any in his LE.  Reports he has a hospital bed, but sleeps in a regular bed.  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  Etiology of carotid occlusion unclear possibly dissection.  He had a prolonged hospital admission with several complications including DVT, pulmonary embolism, hemorrhagic transformation, abdominal wall hematoma, UTI but made quite remarkable recovery   PAIN:  Are you having pain? No, denies pain today, but states sometimes will have L shoulder pain  PRECAUTIONS: Fall  RED FLAGS: None   WEIGHT BEARING RESTRICTIONS: No  FALLS: Has patient fallen in last 6 months? Yes, stating he fell down his stairs ~5 months ago and is now scared to perform stair navigation  LIVING ENVIRONMENT: Lives with: lives alone Lives in: House/apartment Stairs: Yes: Internal: flight steps; can reach both and External: 4 steps; can reach both, but states he doesn't have to go upstairs Has following equipment at home: Quad cane small base, Hemi walker, Wheelchair (manual), shower chair, Shower bench, and L LE custom AFO Pt has custom K5 wheelchair with rigid, contoured back support and Roho cushion. Pt reports he calls [employer] to come adjust his seat cushion when he feels it is getting uncomfortable.  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  DIAGNOSTIC FINDINGS:  EXAM: CT ANGIOGRAPHY HEAD AND NECK WITH AND WITHOUT  CONTRAST IMPRESSION: 1. No acute intracranial abnormality. 2. Chronically occluded right ICA with minimal opacification of the right MCA and ACA branches. 3. Redemonstrated extensive encephalomalacia in the right MCA and ACA territories secondary to a prior infarct.  Electronically Signed   By: Lyndall Gore M.D.   On: 11/27/2023 13:45  COGNITION: Overall cognitive status: Within functional limits for tasks assessed  VISION (03/29/2024): Therapist performed visual field screen with pt appearing to have L homonomous hemianopsia - pt confirms he has loss of vision on L side.   SENSATION: Light touch: Impaired in L LE Proprioception: Impaired  in L LE  Can feel deep pressure in L LE   COORDINATION: Impaired in L LE due to paresis, hypertonia, and overall decreased flexibility  EDEMA:  Not formally assessed  MUSCLE TONE: LLE: Mild, Moderate, and Hypertonic  MUSCLE LENGTH: Not formally assessed; however, demonstrates L hamstring muscle tightness  DTRs:  Not formally assessed  POSTURE: rounded shoulders, forward head, and weight shift right  LOWER EXTREMITY ROM:       Right Eval Active ROM Left Eval Passive ROM  Hip flexion WFL Achieves at least 90  Hip extension    Hip abduction    Hip adduction    Hip internal rotation  Unable to achieve neutral hip rotation, lacking internal rotation  Hip external rotation  [facility] and rests in excessive ER  Knee flexion [facility]  Knee extension WFL Able to achieve terminal knee extension in supine, but with significant discomfort due to hamstring tone  Ankle dorsiflexion WFL Did not remove AFO to assess this date  Ankle plantarflexion WFL Did not remove AFO to assess this date  Ankle inversion    Ankle eversion     (Blank rows = not tested)  LOWER EXTREMITY MMT:  MMT Right Eval Left Eval  Hip flexion 4+ 2-  (compensates with hip adductor activation)  Hip extension    Hip abduction    Hip adduction  3+  Hip internal  rotation  2-  Hip external rotation  2  Knee flexion 4+ 1  Knee extension 4+ 3+  Ankle dorsiflexion 4+ Did not remove AFO to assess this date  Ankle plantarflexion 4+ Did not remove AFO to assess this date  Ankle inversion    Ankle eversion    (Blank rows = not tested)  Manual Muscle Test Scale 0/5 = No muscle contraction can be seen or felt 1/5 = Contraction can be felt, but there is no motion 2-/5 = Part moves through incomplete ROM w/ gravity decreased 2/5 = Part moves through complete ROM w/ gravity decreased 2+/5 = Part moves through incomplete ROM (<50%) against gravity or through complete ROM w/ gravity 3-/5 = Part moves through incomplete ROM (>50%) against gravity 3/5 = Part moves through complete ROM against gravity 3+/5 = Part moves through complete ROM against gravity/slight resistance 4-/5= Holds test position against slight to moderate pressure 4/5 = Part moves through complete ROM against gravity/moderate resistance 4+/5= Holds test position against moderate to strong pressure 5/5 = Part moves through complete ROM against gravity/full resistance  BED MOBILITY: (sleeps in regular bed, but has hospital bed available) Findings: Sit to supine CGA Supine to sit Min A and Mod A Rolling to Right Mod A Rolling to Left Min A and Mod A  TRANSFERS: Sit to stand: CGA and Min A  Assistive device utilized: Counselling psychologist     Stand to sit: CGA and Min A  Assistive device utilized: Tree surgeon to chair: Min A  Assistive device utilized: Counselling psychologist       RAMP:  Not tested  CURB:  Not tested  STAIRS: Not tested *need to assess*  GAIT: Findings: advances L LE with excessive hip external rotation using hip adductors, step to pattern, decreased arm swing- Left, decreased step length- Right, decreased stance time- Left, decreased stride length, decreased hip/knee flexion- Left, decreased ankle dorsiflexion- Left, lateral lean- Right, and  decreased trunk rotation Distance walked: ~43ft Assistive device utilized: small based quad cane in R UE  Level of assistance: CGA and Min A  FUNCTIONAL TESTS:  5 times sit to stand: need to assess Timed up and go (TUG): need to assess 6 minute walk test: need to assess 10 meter walk test: 0.44m/s using small based quad cane and CGA Berg Balance Scale: need to assess  PATIENT SURVEYS:  Stroke Impact Scale 03/15/2024: 49/80                                                                                                                               TREATMENT DATE: 08/29/2024  ***  Gait into therapy clinic using [facility] mod-I with only  slight step-through pattern leading with L LE.   Donned litegait harness with R UE support on litegait handle.   HR goal: 140bpm for 80% estimated HR max     HR 78bpm in standing on treadmill to start   Gait training the following trials on treadmill in litegait harness, not providing BWS, only used for balance: *Pt using R UE support on litegait handle throughout* 02 sec at 0. increased to 0.8-0.40mph, totaling 182ft Pt with 1x L foot stepping on side runner of treadmill causing LOB/tripping so had to stop and take standing reset before continuing - around ~27min20sec Pt advancing L LE during swing independently today, until after tripping causing LOB, then requires min manual facilitation to advance fully and increase pt's confidence  HR 92bpm, Borg RPE 14/20 Seated rest break - HR recovered to 73bpm  33 sec, starting at 0. increasing to 0.59mph after 45seconds, totaling 145ft Continues to have excessive L LE hip abduction/circumduction with excessive hip ER causing him to frequently scrub toes along L side of treadmill runner Cuing throughout to stay to R side of treadmill Requires min manual facilitation to correct L foot placement and prevent LOB when this happens Pt states he is relying too much on his R hand for support during L  stance phase, causing his R hand to get tired before everything else HR 94bpm, Borg RPE 19/20 Seated rest break, HR recovers to 68bpm , 0.39mph increasing to 0.39mph within 20 seconds, totaling 124ft +2 A behind patient to facilitate weight shift onto L LE stance phase to decrease reliance on R UE support HR 88 bpm, Borg RPE 17/20   Patient will benefit from continued use of +2 A to facilitate weight shift onto L stance limb, as well as potential benefit from increased harness support to allow pt to feel confident with decreasing support through R UE.   Stepped off treadmill in litegait harness for balance safety and therapist providing min manual facilitation for L LE management. ***  PATIENT EDUCATION: Education details: Therapy POC, LTGs, HEP, importance of improving L LE flexibility, findings during assessment  Person educated: Patient Education method: Explanation and Handouts Education comprehension: verbalized understanding and needs further education  HOME EXERCISE PROGRAM:  Access Code: [email or web address] URL: [email or web address] Date: 03/10/2024 Prepared by: Connell Kiss  Exercises - Seated Hip Adduction Isometrics with Ball  - 1 x daily - 7 x weekly - 2 sets - 10 reps - 5 seconds hold - Supine Quadricep Sets  - 1 x daily - 7 x weekly - 2 sets - 10 reps - 3 seconds hold Use of Motus Nova Foot device for at least 1hour every day  GOALS: Goals reviewed with patient? Yes  SHORT TERM GOALS: Target date: 07/13/2024  Patient will be independent in home exercise program to improve strength/mobility for better functional independence with ADLs.  Baseline: initiated on 03/10/2024 04/14/2024: continued Goal status: IN PROGRESS   LONG TERM GOALS: Target date: 11/16/2024  Patient (< 34 years old) will complete five times sit to stand (5XSTS) test in < 10 seconds indicating an increased LE strength and improved balance.  Baseline: 03/15/2024: 16.53 seconds from standard  green chair relying on R hand to push-up from armrest and pt's personal SBQC nearby, but not using it VERSUS 25.85 seconds with R hand across chest, with min A for balance/safety without use of UE support 04/14/2024: 8.96 seconds using R UE support to push-up from armrest and pt's SBQC next to him, 13.75 seconds  with R hand across chest with min A for therapist to stabilize chair behind him 05/30/24: 13.01 sec with R UE support on chair; 15.26 sec with R hand across chest and minA from therapist to stabilize chair behind pt 08/10/2024: 12.56 seconds using R UE support on chair armrest & 14.20 seconds with R UE across chest & chair against wall Goal status: IN PROGRESS  2.  Patient will increase Berg Balance score to > 45/56 to demonstrate improved balance and decreased fall risk during functional activities and ADLs.  Baseline: 03/15/2024: 14/56 04/14/2024: 22/56 05/30/24: TBD at next visit 07/27/2024: 33/56 Goal status: IN PROGRESS  3.  Patient will increase 10 meter walk test to >1.40m/s using LRAD as to improve gait speed for better community ambulation and to reduce fall risk.  Baseline: 0.30 m/s using small based quad cane with CGA and 1x light min A due to balance instability  04/14/2024: 0.405 m/s using SBQC with close SBA for safety 05/30/24: 0.379 m/s using SBQC with close SBA for safety.  07/21/2024= 0.43 m/s with SBQC with close SBA Goal status: IN PROGRESS  4.  Patient will increase six minute walk test distance to >532ft for progression towards community ambulator and improve gait ability  Baseline: 290ft with [facility]  04/20/2024:  248ft with SBQC and SBA 05/30/24: 360 ft with [facility] 08/10/2024: 416 feet (126.80 meters, Avg speed 0.82m/s) using SBQC mod-I. Goal status: IN PROGRESS  5.  Patient will reduce timed up and go to <15 seconds to reduce fall risk and demonstrate improved transfer/gait ability. Baseline: 03/15/2024: 25.32 seconds using SBQC and chair with armrest as well as CGA for  safety/steadying 04/14/2024: 22.25 seconds using SBQC & chair w/ armrest and close SBA for safety 05/30/24: 26.87 seconds w/ SBQC and use; 07/21/2024= 21.13 sec with SBQC and SBA 07/21/2024:  21.13 sec with SBQC and SBA  Goal status: IN PROGRESS  6.  Patient will ascend/descend 4 stairs, using railing, independently without loss of balance to improve ability to get in/out of home.   Baseline: 03/31/2024: using R UE support on each HR with skilled min assist for balance using primarily step-to pattern 04/20/2024:  using R UE support on each HR with skilled CGA for safety using step-to pattern 05/30/24: continued use of R UE support on HR with skilled CGA for safety using step-to pattern 08/10/2024: R UE support on HR, step-to pattern, supervision only Goal status: IN PROGRESS  ASSESSMENT:  CLINICAL IMPRESSION: *** Patient presented for recert visit today  but arrived at wrong time but was able to switch PT and be seen by author. He presented with increased pain with AFO on Left LE and requested to take off. Unable to reassess mobility goals without AFO plus patient also was just reassessed 2 weeks ago. Today focused on problem solving assisting the patient in being more independent placing foot in/out of Motus Nova (which he has at home but not using much). He worked hard throughout session - eventually requiring a wedge under equipment to position foot correctly. He was able to operate straps with increased time and doff  independently and only last 3 attempts able to don the equipment. Pt will continue to benefit from skilled therapy to address remaining deficits in order to improve overall QoL and return to PLOF.     OBJECTIVE IMPAIRMENTS: Abnormal gait, cardiopulmonary status limiting activity, decreased activity tolerance, decreased balance, decreased coordination, decreased endurance, decreased knowledge of use of DME, decreased mobility, difficulty walking, decreased ROM, decreased strength,  hypomobility, impaired flexibility, impaired sensation, impaired tone, impaired UE functional use, improper body mechanics, postural dysfunction, and pain.   ACTIVITY LIMITATIONS: carrying, lifting, bending, standing, squatting, sleeping, stairs, transfers, bed mobility, continence, bathing, toileting, dressing, reach over head, hygiene/grooming, locomotion level, and caring for others  PARTICIPATION LIMITATIONS: meal prep, cleaning, laundry, medication management, shopping, community activity, and yard work  PERSONAL FACTORS: Age, Past/current experiences, Time since onset of injury/illness/exacerbation, Transportation, and 3+ comorbidities: HTN, hx of kidney stones, depression are also affecting patient's functional outcome.   REHAB POTENTIAL: Good  CLINICAL DECISION MAKING: Evolving/moderate complexity  EVALUATION COMPLEXITY: Moderate  PLAN:  PT FREQUENCY: 1-2x/week  PT DURATION: 12 weeks  PLANNED INTERVENTIONS: 97164- PT Re-evaluation, 97750- Physical Performance Testing, 97110-Therapeutic exercises, 97530- Therapeutic activity, [id number]- Neuromuscular re-education, 97535- Self Care, [id number]- Manual therapy, [id number]- Gait training, [phone]- Orthotic/Prosthetic subsequent, [phone]- Canalith repositioning, [phone]- Electrical stimulation (manual), Patient/Family education, Balance training, Stair training, Taping, Dry Needling, Joint mobilization, Joint manipulation, Vestibular training, Visual/preceptual remediation/compensation, DME instructions, Cryotherapy, Moist heat, and Biofeedback  PLAN FOR NEXT SESSION:   ***  *HIGT on treadmill* Continue +2 A for L wt shift & increase harness support - goal to decrease reliance on R UE support Increasing speed and gradually work towards adding L LE AW or variable direction walking or no R UE support  Progress HEP  high intensity gait training using +2 HHA vs litegait support vs SBQC continue adding 7lb AW to L LE when appropriate vs decreasing RUE  support/reliance while in litegait Backwards gait training - continue Stepping over obstacles - continue Continue stair training for L LE NMR For stance   R foot taps while sustaining L hip extension For swing phase Reciprocal foot taps L LE stretching and weightbearing for tone management and to improve alignment during gait/standing (avoid excessive hip ER) Non gait training ideas: bridges with R foot placed on unstable surface to promote increased L LE wt bearing  ***    Apryle Stowell, PT, DPT, NCS, CSRS Physical Therapist - [place]  [facility]  3:14 PM 08/29/24

## 2024-08-31 ENCOUNTER — Other Ambulatory Visit: Payer: Self-pay | Admitting: Medical

## 2024-08-31 ENCOUNTER — Ambulatory Visit: Admitting: Physical Therapy

## 2024-08-31 DIAGNOSIS — R278 Other lack of coordination: Secondary | ICD-10-CM

## 2024-08-31 DIAGNOSIS — R29818 Other symptoms and signs involving the nervous system: Secondary | ICD-10-CM

## 2024-08-31 DIAGNOSIS — R2681 Unsteadiness on feet: Secondary | ICD-10-CM

## 2024-08-31 DIAGNOSIS — M6281 Muscle weakness (generalized): Secondary | ICD-10-CM | POA: Diagnosis not present

## 2024-08-31 DIAGNOSIS — I69354 Hemiplegia and hemiparesis following cerebral infarction affecting left non-dominant side: Secondary | ICD-10-CM

## 2024-08-31 DIAGNOSIS — R2689 Other abnormalities of gait and mobility: Secondary | ICD-10-CM

## 2024-08-31 NOTE — Therapy (Signed)
 OUTPATIENT PHYSICAL THERAPY NEURO TREATMENT  Patient Name: Matthew Black MRN: [id number] DOB:12/16/1975, 48 y.o., male Today's Date: 08/31/2024   PCP: Dorina Loving, PA-C REFERRING PROVIDER: Lorilee Sven SQUIBB, MD  END OF SESSION:   PT End of Session - 08/31/24 1543     Visit Number 37    Number of Visits 59    Date for Recertification  11/16/24    Authorization Type [employer] auth: [id number] for 16 pt vst from 8/12-10/07 (10/8 is 10 of 16)    Authorization - Visit Number 10    Authorization - Number of Visits 16    PT Start Time 1542    PT Stop Time 1620    PT Time Calculation (min) 38 min    Equipment Utilized During Treatment Gait belt;Other (comment)   L LE AFO   Activity Tolerance Patient tolerated treatment well    Behavior During Therapy [facility] for tasks assessed/performed              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   Stroke (HCC)    RICA, R A1, R MCA occlusion 07/19/19   Past Surgical History:  Procedure Laterality Date   CRANIOPLASTY Right 06/05/2020   Procedure: CRANIOPLASTY;  Surgeon: Lanis Pupa, MD;  Location: [facility] OR;  Service: Neurosurgery;  Laterality: Right;  right   CRANIOTOMY Right 07/19/2019   Procedure: RIGHT HEMI-CRANIECTOMY With implantation of skull flap to abdominal wall;  Surgeon: Lanis Pupa, MD;  Location: [facility] OR;  Service: Neurosurgery;  Laterality: Right;   CYST EXCISION N/A 10/08/2016   Procedure: EXCISION OF POSTERIOR NECK CYST;  Surgeon: Mitzie DELENA Freund, MD;  Location: [facility] ORS;  Service: General;  Laterality: N/A;    CYSTOSCOPY/URETEROSCOPY/HOLMIUM LASER/STENT PLACEMENT Right 03/16/2020   Procedure: CYSTOSCOPY RIGHT RETROGRADE PYELOGRAM URETEROSCOPY/HOLMIUM LASER/STENT PLACEMENT;  Surgeon: Carolee Sherwood JONETTA DOUGLAS, MD;  Location: [facility];  Service: Urology;  Laterality: Right;   INCISION AND DRAINAGE ABSCESS N/A 09/22/2014   Procedure: INCISION AND DRAINAGE ABSCESS POSTERIOR NECK;  Surgeon: Donnice KATHEE Lunger, MD;  Location: [facility];  Service: General;  Laterality: N/A;   INCISION AND DRAINAGE ABSCESS N/A 12/20/2015   Procedure: INCISION AND DRAINAGE POSTERIOR NECK MASS;  Surgeon: Krystal Spinner, MD;  Location: [facility];  Service: General;  Laterality: N/A;   INCISION AND DRAINAGE ABSCESS Left 07/10/2004   middle finger   IR IVC FILTER PLMT / S&I /IMG GUID/MOD SED  07/21/2019   IR RADIOLOGIST EVAL & MGMT  12/14/2019   IR RADIOLOGIST EVAL & MGMT  12/21/2020   IR VENOGRAM RENAL UNI RIGHT  07/21/2019   MASS EXCISION N/A 07/21/2017   Procedure: EXCISION OF BENIGN NECK LESION WITH LAYERED CLOSURE;  Surgeon: Arelia Filippo, MD;  Location: [facility];  Service: Plastics;  Laterality: N/A;   MASS EXCISION N/A 11/10/2017   Procedure: EXCISION BENIGN LESION OF THE NECK WITH LAYERED CLOSURE;  Surgeon: Arelia Filippo, MD;  Location: [facility];  Service: Plastics;  Laterality: N/A;   Patient Active Problem List  Diagnosis Date Noted   Hx of adenomatous colonic polyps 06/01/2023   COVID-19 virus infection 04/21/2021   Left-sided weakness 04/21/2021   S/P craniotomy 06/05/2020   History of cranioplasty 06/05/2020   Peri-rectal abscess 02/14/2020   Abnormal CT scan, pelvis 02/14/2020   Pancytopenia (HCC) 12/02/2019   Nephrolithiasis 12/02/2019   Hydronephrosis with renal and ureteral calculus obstruction 12/02/2019   Rectal pain 12/02/2019   Hyperkalemia 12/02/2019   Reactive depression    Wound infection after surgery    Sleep disturbance    Dysphagia, post-stroke    Transaminitis    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   Cerebral abscess    Urinary tract infection without hematuria    Altered mental status    Primary hypercoagulable state    Acute pulmonary embolism without acute cor pulmonale (HCC)    Deep vein thrombosis (DVT) of non-extremity vein    Hypokalemia    Acute blood loss anemia    Leukocytosis    Endotracheal tube present    Acute respiratory failure with hypoxemia (HCC)    Stroke (cerebrum) (HCC) 07/19/2019   Pressure injury of skin 07/19/2019   Acute CVA (cerebrovascular accident) (HCC)    Encephalopathy    Dysphagia    Acute encephalopathy    Essential hypertension    Obesity 03/12/2016   Scalp abscess 12/20/2015   Neck abscess 12/20/2015   Pilonidal cyst 02/08/2013    ONSET DATE: stroke in August 2020  REFERRING DIAG: I63.9 (ICD-10-CM) - Cerebrovascular accident (CVA), unspecified mechanism (HCC)   THERAPY DIAG:   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)  Muscle weakness (generalized)  Hemiplegia and hemiparesis following cerebral infarction affecting left non-dominant side (HCC)  Other lack of coordination  Unsteadiness on feet  Other abnormalities of gait and mobility  Other symptoms and signs involving the nervous system   Rationale for Evaluation and Treatment: Rehabilitation  SUBJECTIVE:                                                                                                                                                                                             SUBJECTIVE STATEMENT:  Pt apologizes for late arrival to therapy session due to delay with parking. Pt states he found a book to wedge under his Motus Nova Foot device and was then able to use the device so much yesterday that his ankle fatigued and he was no longer able to actively move his ankle. Pt grateful to have learned how to be more independent with this device during previous therapy  session. Pt states he worked  so hard he had to turn his fan on because he started sweating. Pt states on Monday, after therapy session, he went home and slept he was so exhausted. Pt states his AFO is feeling good today. Denies stumbles/falls.  Denies pain worth noting.   Of note: Botox  in L LE biceps femoris on 06/24/2024 Has the Motus Nova Foot device    Initial Eval: Patient reports his goal is to walk without using a cane and without assistance. Patient states he wants to walk like he did before having the stroke. Reports his stroke was in August of 2020. States he currently uses a quad cane for ambulation and tries to walk as much as possible. Reports he has a manual wheelchair, but states he tries not to use it because it depresses him and makes him feel like he is back to when he first had his stroke. Reports wearing L LE custom AFO at all times.   Reports he is currently doing everything mod-I, but states he could call his mom, dad, or brothers if he needed assistance.  Reports he has routine botox  injections in L UE to help manage tone, but doesn't receive any in his LE.  Reports he has a hospital bed, but sleeps in a regular bed.  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  Etiology of carotid occlusion unclear possibly dissection.  He had a prolonged hospital admission with several complications including DVT, pulmonary embolism, hemorrhagic transformation, abdominal wall hematoma, UTI but made quite remarkable recovery   PAIN:  Are you having pain? No, denies  pain today, but states sometimes will have L shoulder pain  PRECAUTIONS: Fall  RED FLAGS: None   WEIGHT BEARING RESTRICTIONS: No  FALLS: Has patient fallen in last 6 months? Yes, stating he fell down his stairs ~5 months ago and is now scared to perform stair navigation  LIVING ENVIRONMENT: Lives with: lives alone Lives in: House/apartment Stairs: Yes: Internal: flight steps; can reach both and External: 4 steps; can reach both, but states he doesn't have to go upstairs Has following equipment at home: Quad cane small base, Hemi walker, Wheelchair (manual), shower chair, Shower bench, and L LE custom AFO Pt has custom K5 wheelchair with rigid, contoured back support and Roho cushion. Pt reports he calls [employer] to come adjust his seat cushion when he feels it is getting uncomfortable.  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  DIAGNOSTIC FINDINGS:  EXAM: CT ANGIOGRAPHY HEAD AND NECK WITH AND WITHOUT CONTRAST IMPRESSION: 1. No acute intracranial abnormality. 2. Chronically occluded right ICA with minimal opacification of the right MCA and ACA branches. 3. Redemonstrated extensive encephalomalacia in the right MCA and ACA territories secondary to a prior infarct.  Electronically Signed   By: Lyndall  Meade M.D.   On: 11/27/2023 13:45  COGNITION: Overall cognitive status: Within functional limits for tasks assessed  VISION (03/29/2024): Therapist performed visual field screen with pt appearing to have L homonomous hemianopsia - pt  confirms he has loss of vision on L side.   SENSATION: Light touch: Impaired in L LE Proprioception: Impaired  in L LE  Can feel deep pressure in L LE   COORDINATION: Impaired in L LE due to paresis, hypertonia, and overall decreased flexibility  EDEMA:  Not formally assessed  MUSCLE TONE: LLE: Mild, Moderate, and Hypertonic  MUSCLE LENGTH: Not formally assessed; however, demonstrates L hamstring muscle tightness  DTRs:  Not formally assessed  POSTURE: rounded shoulders, forward head, and weight shift right  LOWER EXTREMITY ROM:       Right Eval Active ROM Left Eval Passive ROM  Hip flexion WFL Achieves at least 90  Hip extension    Hip abduction    Hip adduction    Hip internal rotation  Unable to achieve neutral hip rotation, lacking internal rotation  Hip external rotation  [facility] and rests in excessive ER  Knee flexion [facility]  Knee extension WFL Able to achieve terminal knee extension in supine, but with significant discomfort due to hamstring tone  Ankle dorsiflexion WFL Did not remove AFO to assess this date  Ankle plantarflexion WFL Did not remove AFO to assess this date  Ankle inversion    Ankle eversion     (Blank rows = not tested)  LOWER EXTREMITY MMT:    MMT Right Eval Left Eval  Hip flexion 4+ 2-  (compensates with hip adductor activation)  Hip extension    Hip abduction    Hip adduction  3+  Hip internal rotation  2-  Hip external rotation  2  Knee flexion 4+ 1  Knee extension 4+ 3+  Ankle dorsiflexion 4+ Did not remove AFO to assess this date  Ankle plantarflexion 4+ Did not remove AFO to assess this date  Ankle inversion    Ankle eversion    (Blank rows = not tested)  Manual Muscle Test Scale 0/5 = No muscle contraction can be seen or felt 1/5 = Contraction can be felt, but there is no motion 2-/5 = Part moves through incomplete ROM w/ gravity decreased 2/5 = Part moves through complete ROM w/ gravity decreased 2+/5 = Part moves  through incomplete ROM (<50%) against gravity or through complete ROM w/ gravity 3-/5 = Part moves through incomplete ROM (>50%) against gravity 3/5 = Part moves through complete ROM against gravity 3+/5 = Part moves through complete ROM against gravity/slight resistance 4-/5= Holds test position against slight to moderate pressure 4/5 = Part moves through complete ROM against gravity/moderate resistance 4+/5= Holds test position against moderate to strong pressure 5/5 = Part moves through complete ROM against gravity/full resistance  BED MOBILITY: (sleeps in regular bed, but has hospital bed available) Findings: Sit to supine CGA Supine to sit Min A and Mod A Rolling to Right Mod A Rolling to Left Min A and Mod A  TRANSFERS: Sit to stand: CGA and Min A  Assistive device utilized: Counselling psychologist     Stand to sit: CGA and Min A  Assistive device utilized: Tree surgeon to chair: Min A  Assistive device utilized: Counselling psychologist       RAMP:  Not tested  CURB:  Not tested  STAIRS: Not tested *need to  assess*  GAIT: Findings: advances L LE with excessive hip external rotation using hip adductors, step to pattern, decreased arm swing- Left, decreased step length- Right, decreased stance time- Left, decreased stride length, decreased hip/knee flexion- Left, decreased ankle dorsiflexion- Left, lateral lean- Right, and decreased trunk rotation Distance walked: ~71ft Assistive device utilized: small based quad cane in R UE  Level of assistance: CGA and Min A  FUNCTIONAL TESTS:  5 times sit to stand: need to assess Timed up and go (TUG): need to assess 6 minute walk test: need to assess 10 meter walk test: 0.33m/s using small based quad cane and CGA Berg Balance Scale: need to assess  PATIENT SURVEYS:  Stroke Impact Scale 03/15/2024: 49/80                                                                                                                                TREATMENT DATE: 08/31/2024  Gait into therapy clinic using [facility] mod-I with only slight step-through pattern leading with L LE.   Donned litegait harness with R UE support on litegait handle.   HR goal: 140bpm for 80% estimated HR max   HR 82bpm in standing on treadmill to start   Gait training the following trials on treadmill in litegait harness, not providing BWS, only used for balance: *Pt using R UE support on litegait handle throughout* at 0.77mph increased to 1.0-1.16mph, totaling 158ft Pt improved with less frequent stepping of L foot on side runner of treadmill requiring only intermittent manual facilitation to reposition L LE in more adducted positioning  Pt also continues to be too far towards L side of treadmill throughout due to midline orientation impairment; however, slightly improved today Achieves reciprocal pattern throughout +2 A facilitating increased L wt shift onto L stance phase HR 88bpm, Borg RPE 15/20 - pt requesting seated rest break , starting at 0.86mph increased to 0.52mph within 45 seconds, totaling 154ft Continues to have excessive L LE hip abduction/circumduction with excessive hip ER causing him to occasionally scrub toes along L side of treadmill runner Cuing throughout to stay to R side of treadmill to improve midline orientation Continued +2 A facilitating L wt shift onto L stance phase throughout, improving; however, continues to lack full L wt shift Requires only occasional min manual facilitation to correct L foot placement and prevent LOB from stepping on treadmill runner HR 87bpm, Borg RPE 14/20  No reports of pain after gait training on treadmill  Patient will benefit from continued use of +2 A to facilitate weight shift onto L stance limb, as well as potential benefit from increased harness support to allow pt to feel confident with further decreasing support through R UE.   Stepped off treadmill in litegait harness for balance  safety and therapist providing min manual facilitation for L LE management.  Transported pt up to entrance in transport chair.   PATIENT EDUCATION: Education details: Therapy POC, LTGs, HEP, importance  of improving L LE flexibility, findings during assessment  Person educated: Patient Education method: Explanation and Handouts Education comprehension: verbalized understanding and needs further education  HOME EXERCISE PROGRAM:  Access Code: [email or web address] URL: [email or web address] Date: 03/10/2024 Prepared by: Connell Kiss  Exercises - Seated Hip Adduction Isometrics with Ball  - 1 x daily - 7 x weekly - 2 sets - 10 reps - 5 seconds hold - Supine Quadricep Sets  - 1 x daily - 7 x weekly - 2 sets - 10 reps - 3 seconds hold Use of Motus Nova Foot device for at least 1hour every day  GOALS: Goals reviewed with patient? Yes  SHORT TERM GOALS: Target date: 07/13/2024  Patient will be independent in home exercise program to improve strength/mobility for better functional independence with ADLs.  Baseline: initiated on 03/10/2024 04/14/2024: continued Goal status: IN PROGRESS   LONG TERM GOALS: Target date: 11/16/2024  Patient (< 17 years old) will complete five times sit to stand (5XSTS) test in < 10 seconds indicating an increased LE strength and improved balance.  Baseline: 03/15/2024: 16.53 seconds from standard green chair relying on R hand to push-up from armrest and pt's personal SBQC nearby, but not using it VERSUS 25.85 seconds with R hand across chest, with min A for balance/safety without use of UE support 04/14/2024: 8.96 seconds using R UE support to push-up from armrest and pt's SBQC next to him, 13.75 seconds with R hand across chest with min A for therapist to stabilize chair behind him 05/30/24: 13.01 sec with R UE support on chair; 15.26 sec with R hand across chest and minA from therapist to stabilize chair behind pt 08/10/2024: 12.56 seconds using R UE support on  chair armrest & 14.20 seconds with R UE across chest & chair against wall Goal status: IN PROGRESS  2.  Patient will increase Berg Balance score to > 45/56 to demonstrate improved balance and decreased fall risk during functional activities and ADLs.  Baseline: 03/15/2024: 14/56 04/14/2024: 22/56 05/30/24: TBD at next visit 07/27/2024: 33/56 Goal status: IN PROGRESS  3.  Patient will increase 10 meter walk test to >1.38m/s using LRAD as to improve gait speed for better community ambulation and to reduce fall risk.  Baseline: 0.30 m/s using small based quad cane with CGA and 1x light min A due to balance instability  04/14/2024: 0.405 m/s using SBQC with close SBA for safety 05/30/24: 0.379 m/s using SBQC with close SBA for safety.  07/21/2024= 0.43 m/s with SBQC with close SBA Goal status: IN PROGRESS  4.  Patient will increase six minute walk test distance to >529ft for progression towards community ambulator and improve gait ability  Baseline: 262ft with [facility]  04/20/2024:  281ft with SBQC and SBA 05/30/24: 360 ft with [facility] 08/10/2024: 416 feet (126.80 meters, Avg speed 0.3m/s) using SBQC mod-I. Goal status: IN PROGRESS  5.  Patient will reduce timed up and go to <15 seconds to reduce fall risk and demonstrate improved transfer/gait ability. Baseline: 03/15/2024: 25.32 seconds using SBQC and chair with armrest as well as CGA for safety/steadying 04/14/2024: 22.25 seconds using SBQC & chair w/ armrest and close SBA for safety 05/30/24: 26.87 seconds w/ SBQC and use; 07/21/2024= 21.13 sec with SBQC and SBA 07/21/2024:  21.13 sec with SBQC and SBA  Goal status: IN PROGRESS  6.  Patient will ascend/descend 4 stairs, using railing, independently without loss of balance to improve ability to get in/out of home.   Baseline: 03/31/2024: using R UE  support on each HR with skilled min assist for balance using primarily step-to pattern 04/20/2024:  using R UE support on each HR with skilled CGA for safety using  step-to pattern 05/30/24: continued use of R UE support on HR with skilled CGA for safety using step-to pattern 08/10/2024: R UE support on HR, step-to pattern, supervision only Goal status: IN PROGRESS  ASSESSMENT:  CLINICAL IMPRESSION:  Therapy session focused on continuation of higher intensity gait training on treadmill with use of litegait harness for balance support and safety. Gait training today focused on improved left weight shift onto left stance limb with +2 assist to facilitate weight shifting; therefore, gait speed decreased to consistently 0.68mph - 1.9mph for 2 bouts of 2 minutes. Patient demos improving midline orientation on treadmill with decreased L foot catching on treadmill runner. Patient continues to report moderate Borg RPE scale; however, no significant increase in his HR noted today. Patient overall with increased fatigue today following therapy session on Monday and use of his Motus Nova Foot device yesterday. Plan to continue HIGT on treadmill with use of +2 A to improve L wt shift during L stance as well as increase harness support to decrease reliance on R UE support. Patient remains highly motivated to participate in therapy and is eager to continue participating in HIGT on the treadmill, which is an intervention that has been shown to have significant impact on gait speed and endurance. Pt will continue to benefit from skilled therapy to address remaining deficits in order to improve overall QoL and return to PLOF.    OBJECTIVE IMPAIRMENTS: Abnormal gait, cardiopulmonary status limiting activity, decreased activity tolerance, decreased balance, decreased coordination, decreased endurance, decreased knowledge of use of DME, decreased mobility, difficulty walking, decreased ROM, decreased strength, hypomobility, impaired flexibility, impaired sensation, impaired tone, impaired UE functional use, improper body mechanics, postural dysfunction, and pain.   ACTIVITY LIMITATIONS:  carrying, lifting, bending, standing, squatting, sleeping, stairs, transfers, bed mobility, continence, bathing, toileting, dressing, reach over head, hygiene/grooming, locomotion level, and caring for others  PARTICIPATION LIMITATIONS: meal prep, cleaning, laundry, medication management, shopping, community activity, and yard work  PERSONAL FACTORS: Age, Past/current experiences, Time since onset of injury/illness/exacerbation, Transportation, and 3+ comorbidities: HTN, hx of kidney stones, depression are also affecting patient's functional outcome.   REHAB POTENTIAL: Good  CLINICAL DECISION MAKING: Evolving/moderate complexity  EVALUATION COMPLEXITY: Moderate  PLAN:  PT FREQUENCY: 1-2x/week  PT DURATION: 12 weeks  PLANNED INTERVENTIONS: 97164- PT Re-evaluation, 97750- Physical Performance Testing, 97110-Therapeutic exercises, 97530- Therapeutic activity, [id number]- Neuromuscular re-education, 97535- Self Care, [id number]- Manual therapy, [id number]- Gait training, [phone]- Orthotic/Prosthetic subsequent, [phone]- Canalith repositioning, [phone]- Electrical stimulation (manual), Patient/Family education, Balance training, Stair training, Taping, Dry Needling, Joint mobilization, Joint manipulation, Vestibular training, Visual/preceptual remediation/compensation, DME instructions, Cryotherapy, Moist heat, and Biofeedback  PLAN FOR NEXT SESSION:   *HIGT on treadmill* Continue +2 A for L wt shift & increase harness support - goal to decrease reliance on R UE support Increasing speed and gradually work towards adding L LE AW or variable direction walking or no R UE support  Progress HEP  high intensity gait training using +2 HHA vs litegait support vs SBQC continue adding 7lb AW to L LE when appropriate vs decreasing RUE support/reliance while in litegait Backwards gait training - continue Stepping over obstacles - continue Continue stair training for L LE NMR For stance   R foot taps while sustaining L  hip extension For swing phase Reciprocal foot taps L LE stretching and weightbearing  for tone management and to improve alignment during gait/standing (avoid excessive hip ER) Non gait training ideas: bridges with R foot placed on unstable surface to promote increased L LE wt bearing    Mayukha Symmonds, PT, DPT, NCS, CSRS Physical Therapist - [place]  [facility]  4:22 PM 08/31/24

## 2024-09-02 ENCOUNTER — Other Ambulatory Visit: Payer: Self-pay | Admitting: Medical

## 2024-09-06 ENCOUNTER — Ambulatory Visit

## 2024-09-11 DIAGNOSIS — I639 Cerebral infarction, unspecified: Secondary | ICD-10-CM | POA: Diagnosis not present

## 2024-09-13 ENCOUNTER — Ambulatory Visit

## 2024-09-13 DIAGNOSIS — G8929 Other chronic pain: Secondary | ICD-10-CM

## 2024-09-13 DIAGNOSIS — Z741 Need for assistance with personal care: Secondary | ICD-10-CM

## 2024-09-13 DIAGNOSIS — R278 Other lack of coordination: Secondary | ICD-10-CM

## 2024-09-13 DIAGNOSIS — M6281 Muscle weakness (generalized): Secondary | ICD-10-CM

## 2024-09-13 DIAGNOSIS — R2689 Other abnormalities of gait and mobility: Secondary | ICD-10-CM

## 2024-09-13 DIAGNOSIS — R29818 Other symptoms and signs involving the nervous system: Secondary | ICD-10-CM

## 2024-09-13 DIAGNOSIS — M24542 Contracture, left hand: Secondary | ICD-10-CM

## 2024-09-13 DIAGNOSIS — R29898 Other symptoms and signs involving the musculoskeletal system: Secondary | ICD-10-CM

## 2024-09-13 DIAGNOSIS — I69354 Hemiplegia and hemiparesis following cerebral infarction affecting left non-dominant side: Secondary | ICD-10-CM

## 2024-09-13 DIAGNOSIS — R2681 Unsteadiness on feet: Secondary | ICD-10-CM

## 2024-09-13 NOTE — Therapy (Signed)
 OUTPATIENT PHYSICAL THERAPY NEURO TREATMENT  Patient Name: Matthew Black MRN: [id number] DOB:1976/11/01, 48 y.o., male Today's Date: 09/13/2024   PCP: Dorina Loving, PA-C REFERRING PROVIDER: Lorilee Sven SQUIBB, MD  END OF SESSION:   PT End of Session - 09/13/24 1441     Visit Number 38    Number of Visits 59    Date for Recertification  11/16/24    Authorization Type [employer] auth: [id number] for 16 pt vst from 8/12-10/07 (10/8 is 10 of 16)    Authorization - Number of Visits 16    PT Start Time 1445    PT Stop Time 1530    PT Time Calculation (min) 45 min    Equipment Utilized During Treatment Gait belt;Other (comment)   L LE AFO   Activity Tolerance Patient tolerated treatment well    Behavior During Therapy [facility] for tasks assessed/performed         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   Stroke (HCC)    RICA, R A1, R MCA occlusion 07/19/19   Past Surgical History:  Procedure Laterality Date   CRANIOPLASTY Right 06/05/2020   Procedure: CRANIOPLASTY;  Surgeon: Lanis Pupa, MD;  Location: [facility] OR;  Service: Neurosurgery;  Laterality: Right;  right   CRANIOTOMY Right 07/19/2019   Procedure: RIGHT HEMI-CRANIECTOMY With implantation of skull flap to abdominal wall;  Surgeon: Lanis Pupa, MD;  Location: [facility] OR;  Service: Neurosurgery;  Laterality: Right;   CYST EXCISION N/A 10/08/2016   Procedure: EXCISION OF POSTERIOR NECK CYST;  Surgeon: Mitzie DELENA Freund, MD;  Location: [facility] ORS;  Service: General;  Laterality: N/A;   CYSTOSCOPY/URETEROSCOPY/HOLMIUM LASER/STENT  PLACEMENT Right 03/16/2020   Procedure: CYSTOSCOPY RIGHT RETROGRADE PYELOGRAM URETEROSCOPY/HOLMIUM LASER/STENT PLACEMENT;  Surgeon: Carolee Sherwood JONETTA DOUGLAS, MD;  Location: [facility];  Service: Urology;  Laterality: Right;   INCISION AND DRAINAGE ABSCESS N/A 09/22/2014   Procedure: INCISION AND DRAINAGE ABSCESS POSTERIOR NECK;  Surgeon: Donnice KATHEE Lunger, MD;  Location: [facility];  Service: General;  Laterality: N/A;   INCISION AND DRAINAGE ABSCESS N/A 12/20/2015   Procedure: INCISION AND DRAINAGE POSTERIOR NECK MASS;  Surgeon: Krystal Spinner, MD;  Location: [facility];  Service: General;  Laterality: N/A;   INCISION AND DRAINAGE ABSCESS Left 07/10/2004   middle finger   IR IVC FILTER PLMT / S&I /IMG GUID/MOD SED  07/21/2019   IR RADIOLOGIST EVAL & MGMT  12/14/2019   IR RADIOLOGIST EVAL & MGMT  12/21/2020   IR VENOGRAM RENAL UNI RIGHT  07/21/2019   MASS EXCISION N/A 07/21/2017   Procedure: EXCISION OF BENIGN NECK LESION WITH LAYERED CLOSURE;  Surgeon: Arelia Filippo, MD;  Location: [facility];  Service: Plastics;  Laterality: N/A;   MASS EXCISION N/A 11/10/2017   Procedure: EXCISION BENIGN LESION OF THE NECK WITH LAYERED CLOSURE;  Surgeon: Arelia Filippo, MD;  Location: [facility];  Service: Plastics;  Laterality: N/A;   Patient Active Problem List   Diagnosis Date Noted   Hx of adenomatous colonic polyps 06/01/2023  COVID-19 virus infection 04/21/2021   Left-sided weakness 04/21/2021   S/P craniotomy 06/05/2020   History of cranioplasty 06/05/2020   Peri-rectal abscess 02/14/2020   Abnormal CT scan, pelvis 02/14/2020   Pancytopenia (HCC) 12/02/2019   Nephrolithiasis 12/02/2019   Hydronephrosis with renal and ureteral calculus obstruction 12/02/2019   Rectal pain 12/02/2019   Hyperkalemia 12/02/2019   Reactive depression    Wound infection after surgery    Sleep disturbance    Dysphagia, post-stroke    Transaminitis    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    Cerebral abscess    Urinary tract infection without hematuria    Altered mental status    Primary hypercoagulable state    Acute pulmonary embolism without acute cor pulmonale (HCC)    Deep vein thrombosis (DVT) of non-extremity vein    Hypokalemia    Acute blood loss anemia    Leukocytosis    Endotracheal tube present    Acute respiratory failure with hypoxemia (HCC)    Stroke (cerebrum) (HCC) 07/19/2019   Pressure injury of skin 07/19/2019   Acute CVA (cerebrovascular accident) (HCC)    Encephalopathy    Dysphagia    Acute encephalopathy    Essential hypertension    Obesity 03/12/2016   Scalp abscess 12/20/2015   Neck abscess 12/20/2015   Pilonidal cyst 02/08/2013    ONSET DATE: stroke in August 2020  REFERRING DIAG: I63.9 (ICD-10-CM) - Cerebrovascular accident (CVA), unspecified mechanism (HCC)   THERAPY DIAG:   Muscle weakness (generalized)  Hemiplegia and hemiparesis following cerebral infarction affecting left non-dominant side (HCC)  Other lack of coordination  Unsteadiness on feet  Other abnormalities of gait and mobility  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)  Other symptoms and signs involving the nervous system  Contracture, left hand  Need for assistance with personal care  Chronic left shoulder pain  Other symptoms and signs involving the musculoskeletal system   Rationale for Evaluation and Treatment: Rehabilitation  SUBJECTIVE:                                                                                                                                                                                             SUBJECTIVE STATEMENT:  Pt reports he felt fine after utilizing the treadmill and LiteGait last session.  Pt ultimately is doing well and ready to being therapy.  Of note: Botox  in L LE biceps femoris on 06/24/2024 Has the Motus Nova Foot device    Initial Eval: Patient reports  his goal is to walk without using a cane and without assistance. Patient states  he wants to walk like he did before having the stroke. Reports his stroke was in August of 2020. States he currently uses a quad cane for ambulation and tries to walk as much as possible. Reports he has a manual wheelchair, but states he tries not to use it because it depresses him and makes him feel like he is back to when he first had his stroke. Reports wearing L LE custom AFO at all times.   Reports he is currently doing everything mod-I, but states he could call his mom, dad, or brothers if he needed assistance.  Reports he has routine botox  injections in L UE to help manage tone, but doesn't receive any in his LE.  Reports he has a hospital bed, but sleeps in a regular bed.  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  Etiology of carotid occlusion unclear possibly dissection.  He had a prolonged hospital admission with several complications including DVT, pulmonary embolism, hemorrhagic transformation, abdominal wall hematoma, UTI but made quite remarkable recovery   PAIN:  Are you having pain? No, denies pain today, but states sometimes will have L shoulder pain  PRECAUTIONS: Fall  RED FLAGS: None   WEIGHT BEARING RESTRICTIONS: No  FALLS: Has patient fallen in last 6 months? Yes, stating he fell down his stairs ~5 months ago and is now scared to perform stair navigation  LIVING ENVIRONMENT: Lives with: lives alone Lives in: House/apartment Stairs: Yes: Internal: flight steps;  can reach both and External: 4 steps; can reach both, but states he doesn't have to go upstairs Has following equipment at home: Quad cane small base, Hemi walker, Wheelchair (manual), shower chair, Shower bench, and L LE custom AFO Pt has custom K5 wheelchair with rigid, contoured back support and Roho cushion. Pt reports he calls [employer] to come adjust his seat cushion when he feels it is getting uncomfortable.  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  DIAGNOSTIC FINDINGS:  EXAM: CT ANGIOGRAPHY HEAD AND NECK WITH AND WITHOUT CONTRAST IMPRESSION: 1. No acute intracranial abnormality. 2. Chronically occluded right ICA with minimal opacification of the right MCA and ACA branches. 3. Redemonstrated extensive encephalomalacia in the right MCA and ACA territories secondary to a prior infarct.  Electronically Signed   By: Lyndall Gore M.D.   On: 11/27/2023 13:45  COGNITION: Overall cognitive status: Within functional limits for tasks assessed  VISION (03/29/2024): Therapist performed visual field screen with pt appearing to have L homonomous hemianopsia - pt confirms he has loss of vision on L side.   SENSATION: Light touch: Impaired in L LE Proprioception: Impaired  in L LE  Can feel deep pressure in L LE   COORDINATION: Impaired in L LE due to paresis, hypertonia, and overall decreased  flexibility  EDEMA:  Not formally assessed  MUSCLE TONE: LLE: Mild, Moderate, and Hypertonic  MUSCLE LENGTH: Not formally assessed; however,  demonstrates L hamstring muscle tightness  DTRs:  Not formally assessed  POSTURE: rounded shoulders, forward head, and weight shift right  LOWER EXTREMITY ROM:       Right Eval Active ROM Left Eval Passive ROM  Hip flexion WFL Achieves at least 90  Hip extension    Hip abduction    Hip adduction    Hip internal rotation  Unable to achieve neutral hip rotation, lacking internal rotation  Hip external rotation  [facility] and rests in excessive ER  Knee flexion [facility]  Knee extension WFL Able to achieve terminal knee extension in supine, but with significant discomfort due to hamstring tone  Ankle dorsiflexion WFL Did not remove AFO to assess this date  Ankle plantarflexion WFL Did not remove AFO to assess this date  Ankle inversion    Ankle eversion     (Blank rows = not tested)  LOWER EXTREMITY MMT:    MMT Right Eval Left Eval  Hip flexion 4+ 2-  (compensates with hip adductor activation)  Hip extension    Hip abduction    Hip adduction  3+  Hip internal rotation  2-  Hip external rotation  2  Knee flexion 4+ 1  Knee extension 4+ 3+  Ankle dorsiflexion 4+ Did not remove AFO to assess this date  Ankle plantarflexion 4+ Did not remove AFO to assess this date  Ankle inversion    Ankle eversion    (Blank rows = not tested)  Manual Muscle Test Scale 0/5 = No muscle contraction can be seen or felt 1/5 = Contraction can be felt, but there is no motion 2-/5 = Part moves through incomplete ROM w/ gravity decreased 2/5 = Part moves through complete ROM w/ gravity decreased 2+/5 = Part moves through incomplete ROM (<50%) against gravity or through complete ROM w/ gravity 3-/5 = Part moves through incomplete ROM (>50%) against gravity 3/5 = Part moves through complete ROM against gravity 3+/5 = Part moves through complete ROM against gravity/slight resistance 4-/5= Holds test position against slight to moderate pressure 4/5 = Part moves through complete ROM against  gravity/moderate resistance 4+/5= Holds test position against moderate to strong pressure 5/5 = Part moves through complete ROM against gravity/full resistance  BED MOBILITY: (sleeps in regular bed, but has hospital bed available) Findings: Sit to supine CGA Supine to sit Min A and Mod A Rolling to Right Mod A Rolling to Left Min A and Mod A  TRANSFERS: Sit to stand: CGA and Min A  Assistive device utilized: Counselling psychologist     Stand to sit: CGA and Min A  Assistive device utilized: Tree surgeon to chair: Min A  Assistive device utilized: Counselling psychologist       RAMP:  Not tested  CURB:  Not tested  STAIRS: Not tested *need to assess*  GAIT: Findings: advances L LE with excessive hip external rotation using hip adductors, step to pattern, decreased arm swing- Left, decreased step length- Right, decreased stance time- Left, decreased stride length, decreased hip/knee flexion- Left, decreased ankle dorsiflexion- Left, lateral lean- Right, and decreased trunk rotation Distance walked: ~33ft Assistive device utilized: small based quad cane in R UE  Level of assistance: CGA and Min A  FUNCTIONAL TESTS:  5 times sit to stand: need to assess Timed  up and go (TUG): need to assess 6 minute walk test: need to assess 10 meter walk test: 0.72m/s using small based quad cane and CGA Berg Balance Scale: need to assess  PATIENT SURVEYS:  Stroke Impact Scale 03/15/2024: 49/80                                                                                                                               TREATMENT DATE: 09/13/2024   Gait into therapy clinic using [facility] mod-I with only slight step-through pattern leading with L LE.   Donned litegait harness with R UE support on litegait handle.   HR goal: 140 bpm for 80% estimated HR max   HR 80 bpm in standing on treadmill to start   Gait training the following trials on treadmill in litegait harness, not  providing BWS, only used for balance: *Pt using R UE support on litegait handle throughout* at 0.3mph increased to 1.28mph, totaling 122ft Pt improved with less frequent stepping of L foot on side runner of treadmill requiring only intermittent manual facilitation to reposition L LE in more adducted positioning  Pt also continues to be too far towards L side of treadmill throughout due to midline orientation impairment; however, slightly improved today Achieves reciprocal pattern throughout +2 A facilitating increased L wt shift onto L stance phase HR 100 bpm, Borg RPE 13/20 - pt requesting seated rest break  Pt noted the harness to be uncomfortable with sitting down and requested for it to be removed.  Pt then elected to not perform the treadmill training again and move to ambulation around the gym with the NBQC.    Stepped off treadmill with use of the NBQC and close CGA for safety, with pt performing well.   Pt ambulated around the gym with CGA and verbal cuing for proper step length, and pt taking a few steps without the cane, but unable to advance the R LE past the L LE due to reduced stance time and weakness of the L LE.  Pt ambulated another ~90' with the cane throughout the remainder of the time.  Pt then transferred to the transfer chair and taken to the front desk to be taken back to his transportation.  No reports of pain after gait training on treadmill  Patient will benefit from continued use of +2 A to facilitate weight shift onto L stance limb, as well as potential benefit from increased harness support to allow pt to feel confident with further decreasing support through R UE.       PATIENT EDUCATION: Education details: Therapy POC, LTGs, HEP, importance of improving L LE flexibility, findings during assessment  Person educated: Patient Education method: Explanation and Handouts Education comprehension: verbalized understanding and needs further education  HOME  EXERCISE PROGRAM:  Access Code: [email or web address] URL: [email or web address] Date: 03/10/2024 Prepared by: Connell Kiss  Exercises - Seated Hip Adduction Isometrics with Ball  - 1 x daily - 7  x weekly - 2 sets - 10 reps - 5 seconds hold - Supine Quadricep Sets  - 1 x daily - 7 x weekly - 2 sets - 10 reps - 3 seconds hold Use of Motus Nova Foot device for at least 1hour every day  GOALS: Goals reviewed with patient? Yes  SHORT TERM GOALS: Target date: 07/13/2024  Patient will be independent in home exercise program to improve strength/mobility for better functional independence with ADLs.  Baseline: initiated on 03/10/2024 04/14/2024: continued Goal status: IN PROGRESS   LONG TERM GOALS: Target date: 11/16/2024  Patient (< 45 years old) will complete five times sit to stand (5XSTS) test in < 10 seconds indicating an increased LE strength and improved balance.  Baseline: 03/15/2024: 16.53 seconds from standard green chair relying on R hand to push-up from armrest and pt's personal SBQC nearby, but not using it VERSUS 25.85 seconds with R hand across chest, with min A for balance/safety without use of UE support 04/14/2024: 8.96 seconds using R UE support to push-up from armrest and pt's SBQC next to him, 13.75 seconds with R hand across chest with min A for therapist to stabilize chair behind him 05/30/24: 13.01 sec with R UE support on chair; 15.26 sec with R hand across chest and minA from therapist to stabilize chair behind pt 08/10/2024: 12.56 seconds using R UE support on chair armrest & 14.20 seconds with R UE across chest & chair against wall Goal status: IN PROGRESS  2.  Patient will increase Berg Balance score to > 45/56 to demonstrate improved balance and decreased fall risk during functional activities and ADLs.  Baseline: 03/15/2024: 14/56 04/14/2024: 22/56 05/30/24: TBD at next visit 07/27/2024: 33/56 Goal status: IN PROGRESS  3.  Patient will increase 10 meter walk test to  >1.49m/s using LRAD as to improve gait speed for better community ambulation and to reduce fall risk.  Baseline: 0.30 m/s using small based quad cane with CGA and 1x light min A due to balance instability  04/14/2024: 0.405 m/s using SBQC with close SBA for safety 05/30/24: 0.379 m/s using SBQC with close SBA for safety.  07/21/2024= 0.43 m/s with SBQC with close SBA Goal status: IN PROGRESS  4.  Patient will increase six minute walk test distance to >588ft for progression towards community ambulator and improve gait ability  Baseline: 254ft with [facility]  04/20/2024:  235ft with SBQC and SBA 05/30/24: 360 ft with [facility] 08/10/2024: 416 feet (126.80 meters, Avg speed 0.11m/s) using SBQC mod-I. Goal status: IN PROGRESS  5.  Patient will reduce timed up and go to <15 seconds to reduce fall risk and demonstrate improved transfer/gait ability. Baseline: 03/15/2024: 25.32 seconds using SBQC and chair with armrest as well as CGA for safety/steadying 04/14/2024: 22.25 seconds using SBQC & chair w/ armrest and close SBA for safety 05/30/24: 26.87 seconds w/ SBQC and use; 07/21/2024= 21.13 sec with SBQC and SBA 07/21/2024:  21.13 sec with SBQC and SBA  Goal status: IN PROGRESS  6.  Patient will ascend/descend 4 stairs, using railing, independently without loss of balance to improve ability to get in/out of home.   Baseline: 03/31/2024: using R UE support on each HR with skilled min assist for balance using primarily step-to pattern 04/20/2024:  using R UE support on each HR with skilled CGA for safety using step-to pattern 05/30/24: continued use of R UE support on HR with skilled CGA for safety using step-to pattern 08/10/2024: R UE support on HR, step-to pattern, supervision  only Goal status: IN PROGRESS  ASSESSMENT:  CLINICAL IMPRESSION:   Pt ultimately made good progress towards goals and was able to elevate his HR with the use of the LiteGait and the treadmill.  Pt will continue to benefit from heavy use of the  LiteGait in order to improve overall tolerance and to increase the intensity that he is able to ambulate.   Pt will continue to benefit from skilled therapy to address remaining deficits in order to improve overall QoL and return to PLOF.      OBJECTIVE IMPAIRMENTS: Abnormal gait, cardiopulmonary status limiting activity, decreased activity tolerance, decreased balance, decreased coordination, decreased endurance, decreased knowledge of use of DME, decreased mobility, difficulty walking, decreased ROM, decreased strength, hypomobility, impaired flexibility, impaired sensation, impaired tone, impaired UE functional use, improper body mechanics, postural dysfunction, and pain.   ACTIVITY LIMITATIONS: carrying, lifting, bending, standing, squatting, sleeping, stairs, transfers, bed mobility, continence, bathing, toileting, dressing, reach over head, hygiene/grooming, locomotion level, and caring for others  PARTICIPATION LIMITATIONS: meal prep, cleaning, laundry, medication management, shopping, community activity, and yard work  PERSONAL FACTORS: Age, Past/current experiences, Time since onset of injury/illness/exacerbation, Transportation, and 3+ comorbidities: HTN, hx of kidney stones, depression are also affecting patient's functional outcome.   REHAB POTENTIAL: Good  CLINICAL DECISION MAKING: Evolving/moderate complexity  EVALUATION COMPLEXITY: Moderate  PLAN:  PT FREQUENCY: 1-2x/week  PT DURATION: 12 weeks  PLANNED INTERVENTIONS: [id number]- PT Re-evaluation, 97750- Physical Performance Testing, 97110-Therapeutic exercises, 97530- Therapeutic activity, [id number]- Neuromuscular re-education, 97535- Self Care, [id number]- Manual therapy, [id number]- Gait training, [phone]- Orthotic/Prosthetic subsequent, [phone]- Canalith repositioning, [phone]- Electrical stimulation (manual), Patient/Family education, Balance training, Stair training, Taping, Dry Needling, Joint mobilization, Joint manipulation, Vestibular  training, Visual/preceptual remediation/compensation, DME instructions, Cryotherapy, Moist heat, and Biofeedback  PLAN FOR NEXT SESSION:    *HIGT on treadmill* Continue +2 A for L wt shift & increase harness support - goal to decrease reliance on R UE support Increasing speed and gradually work towards adding L LE AW or variable direction walking or no R UE support  Progress HEP  high intensity gait training using +2 HHA vs litegait support vs SBQC continue adding 7lb AW to L LE when appropriate vs decreasing RUE support/reliance while in litegait Backwards gait training - continue Stepping over obstacles - continue Continue stair training for L LE NMR For stance   R foot taps while sustaining L hip extension For swing phase Reciprocal foot taps L LE stretching and weightbearing for tone management and to improve alignment during gait/standing (avoid excessive hip ER) Non gait training ideas: bridges with R foot placed on unstable surface to promote increased L LE wt bearing    Fonda Simpers, PT, DPT Physical Therapist - [facility]  [facility]  09/13/24, 5:59 PM

## 2024-09-15 ENCOUNTER — Ambulatory Visit

## 2024-09-15 ENCOUNTER — Ambulatory Visit: Admitting: Physical Therapy

## 2024-09-15 DIAGNOSIS — M6281 Muscle weakness (generalized): Secondary | ICD-10-CM

## 2024-09-15 DIAGNOSIS — I69354 Hemiplegia and hemiparesis following cerebral infarction affecting left non-dominant side: Secondary | ICD-10-CM

## 2024-09-15 DIAGNOSIS — R2689 Other abnormalities of gait and mobility: Secondary | ICD-10-CM

## 2024-09-15 DIAGNOSIS — R29818 Other symptoms and signs involving the nervous system: Secondary | ICD-10-CM

## 2024-09-15 DIAGNOSIS — R29898 Other symptoms and signs involving the musculoskeletal system: Secondary | ICD-10-CM

## 2024-09-15 DIAGNOSIS — R2681 Unsteadiness on feet: Secondary | ICD-10-CM

## 2024-09-15 DIAGNOSIS — R278 Other lack of coordination: Secondary | ICD-10-CM

## 2024-09-15 NOTE — Therapy (Signed)
 OUTPATIENT PHYSICAL THERAPY NEURO TREATMENT  Patient Name: Matthew Black MRN: [id number] DOB:1976-09-01, 48 y.o., male Today's Date: 09/15/2024   PCP: Dorina Loving, PA-C REFERRING PROVIDER: Lorilee Sven SQUIBB, MD  END OF SESSION:    PT End of Session - 09/15/24 1729     Visit Number 39    Number of Visits 59    Date for Recertification  11/16/24    Authorization Type [employer] auth #: [id number] for 8 PT vst from 10/07-11/04    Authorization - Visit Number 3    Authorization - Number of Visits 8    PT Start Time 1317    PT Stop Time 1400    PT Time Calculation (min) 43 min    Equipment Utilized During Treatment Gait belt;Other (comment)   L LE AFO   Activity Tolerance Patient tolerated treatment well    Behavior During Therapy [facility] for tasks assessed/performed          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   Stroke (HCC)    RICA, R A1, R MCA occlusion 07/19/19   Past Surgical History:  Procedure Laterality Date   CRANIOPLASTY Right 06/05/2020   Procedure: CRANIOPLASTY;  Surgeon: Lanis Pupa, MD;  Location: [facility] OR;  Service: Neurosurgery;  Laterality: Right;  right   CRANIOTOMY Right 07/19/2019   Procedure: RIGHT HEMI-CRANIECTOMY With implantation of skull flap to abdominal wall;  Surgeon: Lanis Pupa, MD;  Location: [facility] OR;  Service: Neurosurgery;  Laterality: Right;   CYST EXCISION N/A 10/08/2016   Procedure: EXCISION OF POSTERIOR NECK CYST;  Surgeon: Mitzie DELENA Freund, MD;  Location: [facility] ORS;  Service: General;  Laterality: N/A;    CYSTOSCOPY/URETEROSCOPY/HOLMIUM LASER/STENT PLACEMENT Right 03/16/2020   Procedure: CYSTOSCOPY RIGHT RETROGRADE PYELOGRAM URETEROSCOPY/HOLMIUM LASER/STENT PLACEMENT;  Surgeon: Carolee Sherwood JONETTA DOUGLAS, MD;  Location: [facility] ORS;  Service: Urology;  Laterality: Right;   INCISION AND DRAINAGE ABSCESS N/A 09/22/2014   Procedure: INCISION AND DRAINAGE ABSCESS POSTERIOR NECK;  Surgeon: Donnice KATHEE Lunger, MD;  Location: [facility];  Service: General;  Laterality: N/A;   INCISION AND DRAINAGE ABSCESS N/A 12/20/2015   Procedure: INCISION AND DRAINAGE POSTERIOR NECK MASS;  Surgeon: Krystal Spinner, MD;  Location: [facility];  Service: General;  Laterality: N/A;   INCISION AND DRAINAGE ABSCESS Left 07/10/2004   middle finger   IR IVC FILTER PLMT / S&I /IMG GUID/MOD SED  07/21/2019   IR RADIOLOGIST EVAL & MGMT  12/14/2019   IR RADIOLOGIST EVAL & MGMT  12/21/2020   IR VENOGRAM RENAL UNI RIGHT  07/21/2019   MASS EXCISION N/A 07/21/2017   Procedure: EXCISION OF BENIGN NECK LESION WITH LAYERED CLOSURE;  Surgeon: Arelia Filippo, MD;  Location: [facility];  Service: Plastics;  Laterality: N/A;   MASS EXCISION N/A 11/10/2017   Procedure: EXCISION BENIGN LESION OF THE NECK WITH LAYERED CLOSURE;  Surgeon: Arelia Filippo, MD;  Location:  SURGERY CENTER;  Service: Plastics;  Laterality: N/A;   Patient Active Problem List   Diagnosis Date Noted   Hx of  adenomatous colonic polyps 06/01/2023   COVID-19 virus infection 04/21/2021   Left-sided weakness 04/21/2021   S/P craniotomy 06/05/2020   History of cranioplasty 06/05/2020   Peri-rectal abscess 02/14/2020   Abnormal CT scan, pelvis 02/14/2020   Pancytopenia (HCC) 12/02/2019   Nephrolithiasis 12/02/2019   Hydronephrosis with renal and ureteral calculus obstruction 12/02/2019   Rectal pain 12/02/2019   Hyperkalemia 12/02/2019   Reactive depression    Wound infection after surgery    Sleep disturbance    Dysphagia, post-stroke    Transaminitis    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   Cerebral abscess    Urinary tract infection without hematuria    Altered mental status    Primary hypercoagulable state    Acute pulmonary embolism without acute cor pulmonale (HCC)    Deep vein thrombosis (DVT) of non-extremity vein    Hypokalemia    Acute blood loss anemia    Leukocytosis    Endotracheal tube present    Acute respiratory failure with hypoxemia (HCC)    Stroke (cerebrum) (HCC) 07/19/2019   Pressure injury of skin 07/19/2019   Acute CVA (cerebrovascular accident) (HCC)    Encephalopathy    Dysphagia    Acute encephalopathy    Essential hypertension    Obesity 03/12/2016   Scalp abscess 12/20/2015   Neck abscess 12/20/2015   Pilonidal cyst 02/08/2013    ONSET DATE: stroke in August 2020  REFERRING DIAG: I63.9 (ICD-10-CM) - Cerebrovascular accident (CVA), unspecified mechanism (HCC)   THERAPY DIAG:    Muscle weakness (generalized)  Hemiplegia and hemiparesis following cerebral infarction affecting left non-dominant side (HCC)  Other lack of coordination  Unsteadiness on feet  Other abnormalities of gait and mobility  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)  Other symptoms and signs involving the nervous system  Other symptoms and signs involving the musculoskeletal system   Rationale for Evaluation and Treatment: Rehabilitation  SUBJECTIVE:                                                                                                                                                                                             SUBJECTIVE STATEMENT:  Pt reporting that he is doing well today. Pt reporting he wasn't very fatigued after previous session. Pt reporting that he got his flu shot yesterday.   Of note: Botox  in L LE biceps femoris on 06/24/2024 Has the Motus Nova Foot device    Initial Eval: Patient reports his goal is to walk without using a  cane and without assistance. Patient states he wants to walk like he  did before having the stroke. Reports his stroke was in August of 2020. States he currently uses a quad cane for ambulation and tries to walk as much as possible. Reports he has a manual wheelchair, but states he tries not to use it because it depresses him and makes him feel like he is back to when he first had his stroke. Reports wearing L LE custom AFO at all times.   Reports he is currently doing everything mod-I, but states he could call his mom, dad, or brothers if he needed assistance.  Reports he has routine botox  injections in L UE to help manage tone, but doesn't receive any in his LE.  Reports he has a hospital bed, but sleeps in a regular bed.  Pt accompanied by: self  PERTINENT HISTORY: L hemiparesis s/p R ACA and MCA territory CVA in August 2020, HTN, hx of kidney stones, depression  Per Neurology MD note on 11/24/2023: 48-year-old African-American male with large right hemispheric infarct due to right internal carotid and middle cerebral artery occlusion with cytotoxic edema and brain herniation s/p hemicraniectomy in 06/2019 is doing reasonably well with residual left spastic hemiparesis and left hemianopia.  Etiology of carotid occlusion unclear possibly dissection.  He had a prolonged hospital admission with several complications including DVT, pulmonary embolism, hemorrhagic transformation, abdominal wall hematoma, UTI but made quite remarkable recovery   PAIN:  Are you having pain? No, denies pain today, but states sometimes will have L shoulder pain  PRECAUTIONS: Fall  RED FLAGS: None   WEIGHT BEARING RESTRICTIONS: No  FALLS: Has patient fallen in last 6 months? Yes, stating he fell down his stairs ~5 months ago and is now scared to perform stair navigation  LIVING ENVIRONMENT: Lives with: lives alone Lives in: House/apartment Stairs: Yes: Internal: flight steps; can reach both and External: 4 steps;  can reach both, but states he doesn't have to go upstairs Has following equipment at home: Quad cane small base, Hemi walker, Wheelchair (manual), shower chair, Shower bench, and L LE custom AFO Pt has custom K5 wheelchair with rigid, contoured back support and Roho cushion. Pt reports he calls [employer] to come adjust his seat cushion when he feels it is getting uncomfortable.  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  DIAGNOSTIC FINDINGS:  EXAM: CT ANGIOGRAPHY HEAD AND NECK WITH AND WITHOUT CONTRAST IMPRESSION: 1. No acute intracranial abnormality. 2. Chronically occluded right ICA with minimal opacification of the right MCA and ACA branches. 3. Redemonstrated extensive encephalomalacia in the right MCA and ACA territories secondary to a prior infarct.  Electronically Signed   By: Lyndall Gore M.D.   On: 11/27/2023 13:45  COGNITION: Overall cognitive status: Within functional limits for tasks assessed  VISION (03/29/2024): Therapist performed visual field screen with pt appearing to have L homonomous hemianopsia - pt confirms he has loss of vision on L side.   SENSATION: Light touch: Impaired in L LE Proprioception: Impaired  in L LE  Can feel deep pressure in L LE   COORDINATION: Impaired in L LE due to paresis, hypertonia, and overall decreased flexibility  EDEMA:  Not formally  assessed  MUSCLE TONE: LLE: Mild, Moderate, and Hypertonic  MUSCLE LENGTH: Not formally assessed; however, demonstrates L hamstring muscle  tightness  DTRs:  Not formally assessed  POSTURE: rounded shoulders, forward head, and weight shift right  LOWER EXTREMITY ROM:       Right Eval Active ROM Left Eval Passive ROM  Hip flexion WFL Achieves at least 90  Hip extension    Hip abduction    Hip adduction    Hip internal rotation  Unable to achieve neutral hip rotation, lacking internal rotation  Hip external rotation  [facility] and rests in excessive ER  Knee flexion [facility]  Knee extension WFL Able to achieve terminal knee extension in supine, but with significant discomfort due to hamstring tone  Ankle dorsiflexion WFL Did not remove AFO to assess this date  Ankle plantarflexion WFL Did not remove AFO to assess this date  Ankle inversion    Ankle eversion     (Blank rows = not tested)  LOWER EXTREMITY MMT:    MMT Right Eval Left Eval  Hip flexion 4+ 2-  (compensates with hip adductor activation)  Hip extension    Hip abduction    Hip adduction  3+  Hip internal rotation  2-  Hip external rotation  2  Knee flexion 4+ 1  Knee extension 4+ 3+  Ankle dorsiflexion 4+ Did not remove AFO to assess this date  Ankle plantarflexion 4+ Did not remove AFO to assess this date  Ankle inversion    Ankle eversion    (Blank rows = not tested)  Manual Muscle Test Scale 0/5 = No muscle contraction can be seen or felt 1/5 = Contraction can be felt, but there is no motion 2-/5 = Part moves through incomplete ROM w/ gravity decreased 2/5 = Part moves through complete ROM w/ gravity decreased 2+/5 = Part moves through incomplete ROM (<50%) against gravity or through complete ROM w/ gravity 3-/5 = Part moves through incomplete ROM (>50%) against gravity 3/5 = Part moves through complete ROM against gravity 3+/5 = Part moves through complete ROM against gravity/slight resistance 4-/5= Holds test position against slight to moderate pressure 4/5 = Part moves through complete ROM against gravity/moderate resistance 4+/5= Holds  test position against moderate to strong pressure 5/5 = Part moves through complete ROM against gravity/full resistance  BED MOBILITY: (sleeps in regular bed, but has hospital bed available) Findings: Sit to supine CGA Supine to sit Min A and Mod A Rolling to Right Mod A Rolling to Left Min A and Mod A  TRANSFERS: Sit to stand: CGA and Min A  Assistive device utilized: Counselling psychologist     Stand to sit: CGA and Min A  Assistive device utilized: Tree surgeon to chair: Min A  Assistive device utilized: Counselling psychologist       RAMP:  Not tested  CURB:  Not tested  STAIRS: Not tested *need to assess*  GAIT: Findings: advances L LE with excessive hip external rotation using hip adductors, step to pattern, decreased arm swing- Left, decreased step length- Right, decreased stance time- Left, decreased stride length, decreased hip/knee flexion- Left, decreased ankle dorsiflexion- Left, lateral lean- Right, and decreased trunk rotation Distance walked: ~59ft Assistive device utilized: small based quad cane in R UE  Level of assistance: CGA and Min A  FUNCTIONAL TESTS:  5 times sit to stand: need to assess Timed up and go (TUG): need to  assess 6 minute walk test: need to assess 10 meter walk test: 0.66m/s using small based quad cane and CGA Berg Balance Scale: need to assess  PATIENT SURVEYS:  Stroke Impact Scale 03/15/2024: 49/80                                                                                                                               TREATMENT DATE: 09/15/2024   Gait into therapy clinic using [facility] mod-I with only slight step-through pattern leading with L LE.   Donned litegait harness with R UE support on litegait handle.   HR goal: 140 bpm for 80% estimated HR max   HR 83 bpm in standing on treadmill to start   Gait training the following trials on treadmill in litegait harness, not providing BWS, only used for balance: *Pt using  R UE support on litegait handle throughout*   at 0.39mph increased to 1.78mph after 30 sec, totaling 160ft Requesting to stop d/t R hand fatigue; pt also reporting back pain, R lumbar region Pt improved with less frequent stepping of L foot on side runner of treadmill requiring only intermittent manual facilitation to reposition L LE in more adducted positioning; requiring increased support for for L foot clearance this date Pt also continues to be too far towards L side of treadmill throughout due to midline orientation impairment; pt able to correct intermittently throughout session following verbal cueing.  Achieves reciprocal pattern throughout +2 A facilitating increased L wt shift onto L stance phase HR 85 bpm, Borg RPE 12/20 - pt requesting seated rest break  Due to increased signs of fatigue this date, assessed vitals in seated: 125/69 (85), 59 bpm.   at 0.49mph increased to 1.22mph after 25 sec, totaling 115ft Pt reporting improvement in back pain this bought Continued to demo less frequent abducted stepping of L foot following verbal cueing; continued +2 A facilitating increased L wt shift onto L stance phase HR 73 BPM: 14/20 Borg RPE   Stepped off treadmill with use of RUE support at litegait and close CGA for safety, with pt performing well.   Pt then transferred to the transfer chair and taken to the front desk to be taken back to his transportation.  No reports of pain after gait training on treadmill  Patient will benefit from continued use of +2 A to facilitate weight shift onto L stance limb, as well as potential benefit from increased harness support to allow pt to feel confident with further decreasing support through R UE.       PATIENT EDUCATION: Education details: Therapy POC, LTGs, HEP, importance of improving L LE flexibility, findings during assessment  Person educated: Patient Education method: Explanation and Handouts Education comprehension:  verbalized understanding and needs further education  HOME EXERCISE PROGRAM:  Access Code: [email or web address] URL: [email or web address] Date: 03/10/2024 Prepared by: Connell Kiss  Exercises - Seated Hip Adduction Isometrics with Ball  - 1 x daily -  7 x weekly - 2 sets - 10 reps - 5 seconds hold - Supine Quadricep Sets  - 1 x daily - 7 x weekly - 2 sets - 10 reps - 3 seconds hold Use of Motus Nova Foot device for at least 1hour every day  GOALS: Goals reviewed with patient? Yes  SHORT TERM GOALS: Target date: 07/13/2024  Patient will be independent in home exercise program to improve strength/mobility for better functional independence with ADLs.  Baseline: initiated on 03/10/2024 04/14/2024: continued Goal status: IN PROGRESS   LONG TERM GOALS: Target date: 11/16/2024  Patient (< 44 years old) will complete five times sit to stand (5XSTS) test in < 10 seconds indicating an increased LE strength and improved balance.  Baseline: 03/15/2024: 16.53 seconds from standard green chair relying on R hand to push-up from armrest and pt's personal SBQC nearby, but not using it VERSUS 25.85 seconds with R hand across chest, with min A for balance/safety without use of UE support 04/14/2024: 8.96 seconds using R UE support to push-up from armrest and pt's SBQC next to him, 13.75 seconds with R hand across chest with min A for therapist to stabilize chair behind him 05/30/24: 13.01 sec with R UE support on chair; 15.26 sec with R hand across chest and minA from therapist to stabilize chair behind pt 08/10/2024: 12.56 seconds using R UE support on chair armrest & 14.20 seconds with R UE across chest & chair against wall Goal status: IN PROGRESS  2.  Patient will increase Berg Balance score to > 45/56 to demonstrate improved balance and decreased fall risk during functional activities and ADLs.  Baseline: 03/15/2024: 14/56 04/14/2024: 22/56 05/30/24: TBD at next visit 07/27/2024: 33/56 Goal status: IN  PROGRESS  3.  Patient will increase 10 meter walk test to >1.66m/s using LRAD as to improve gait speed for better community ambulation and to reduce fall risk.  Baseline: 0.30 m/s using small based quad cane with CGA and 1x light min A due to balance instability  04/14/2024: 0.405 m/s using SBQC with close SBA for safety 05/30/24: 0.379 m/s using SBQC with close SBA for safety.  07/21/2024= 0.43 m/s with SBQC with close SBA Goal status: IN PROGRESS  4.  Patient will increase six minute walk test distance to >549ft for progression towards community ambulator and improve gait ability  Baseline: 231ft with [facility]  04/20/2024:  264ft with SBQC and SBA 05/30/24: 360 ft with [facility] 08/10/2024: 416 feet (126.80 meters, Avg speed 0.28m/s) using SBQC mod-I. Goal status: IN PROGRESS  5.  Patient will reduce timed up and go to <15 seconds to reduce fall risk and demonstrate improved transfer/gait ability. Baseline: 03/15/2024: 25.32 seconds using SBQC and chair with armrest as well as CGA for safety/steadying 04/14/2024: 22.25 seconds using SBQC & chair w/ armrest and close SBA for safety 05/30/24: 26.87 seconds w/ SBQC and use; 07/21/2024= 21.13 sec with SBQC and SBA 07/21/2024:  21.13 sec with SBQC and SBA  Goal status: IN PROGRESS  6.  Patient will ascend/descend 4 stairs, using railing, independently without loss of balance to improve ability to get in/out of home.   Baseline: 03/31/2024: using R UE support on each HR with skilled min assist for balance using primarily step-to pattern 04/20/2024:  using R UE support on each HR with skilled CGA for safety using step-to pattern 05/30/24: continued use of R UE support on HR with skilled CGA for safety using step-to pattern 08/10/2024: R UE support on HR, step-to pattern,  supervision only Goal status: IN PROGRESS  ASSESSMENT:  CLINICAL IMPRESSION:   Pt ultimately made good progress towards goals and able to participate HIGT with the use of the LiteGait and the  treadmill; pt continuing to heavily rely on RUE support throughout. Pt able to increase ambulation during both bouts of gait this date. Pt will continue to benefit from heavy use of the LiteGait in order to improve overall tolerance and to increase the intensity that he is able to ambulate. Overall, pt able to require less facilitation for more adducted stance during gait but requiring some increased manual facilitation for foot clearance this date. Pt will continue to benefit from skilled therapy to address remaining deficits in order to improve overall QoL and return to PLOF.      OBJECTIVE IMPAIRMENTS: Abnormal gait, cardiopulmonary status limiting activity, decreased activity tolerance, decreased balance, decreased coordination, decreased endurance, decreased knowledge of use of DME, decreased mobility, difficulty walking, decreased ROM, decreased strength, hypomobility, impaired flexibility, impaired sensation, impaired tone, impaired UE functional use, improper body mechanics, postural dysfunction, and pain.   ACTIVITY LIMITATIONS: carrying, lifting, bending, standing, squatting, sleeping, stairs, transfers, bed mobility, continence, bathing, toileting, dressing, reach over head, hygiene/grooming, locomotion level, and caring for others  PARTICIPATION LIMITATIONS: meal prep, cleaning, laundry, medication management, shopping, community activity, and yard work  PERSONAL FACTORS: Age, Past/current experiences, Time since onset of injury/illness/exacerbation, Transportation, and 3+ comorbidities: HTN, hx of kidney stones, depression are also affecting patient's functional outcome.   REHAB POTENTIAL: Good  CLINICAL DECISION MAKING: Evolving/moderate complexity  EVALUATION COMPLEXITY: Moderate  PLAN:  PT FREQUENCY: 1-2x/week  PT DURATION: 12 weeks  PLANNED INTERVENTIONS: [id number]- PT Re-evaluation, 97750- Physical Performance Testing, 97110-Therapeutic exercises, 97530- Therapeutic activity,  [id number]- Neuromuscular re-education, 97535- Self Care, [id number]- Manual therapy, [id number]- Gait training, [phone]- Orthotic/Prosthetic subsequent, [phone]- Canalith repositioning, [phone]- Electrical stimulation (manual), Patient/Family education, Balance training, Stair training, Taping, Dry Needling, Joint mobilization, Joint manipulation, Vestibular training, Visual/preceptual remediation/compensation, DME instructions, Cryotherapy, Moist heat, and Biofeedback  PLAN FOR NEXT SESSION:  Progress note*   *HIGT on treadmill* Continue +2 A for L wt shift & increase harness support - goal to decrease reliance on R UE support Increasing speed and gradually work towards adding L LE AW or variable direction walking or no R UE support  Progress HEP  high intensity gait training using +2 HHA vs litegait support vs SBQC continue adding 7lb AW to L LE when appropriate vs decreasing RUE support/reliance while in litegait Backwards gait training - continue Stepping over obstacles - continue Continue stair training for L LE NMR For stance   R foot taps while sustaining L hip extension For swing phase Reciprocal foot taps L LE stretching and weightbearing for tone management and to improve alignment during gait/standing (avoid excessive hip ER) Non gait training ideas: bridges with R foot placed on unstable surface to promote increased L LE wt bearing    Chiquita Silvan, SPT Physical Therapy Student - [facility]  [facility]  09/15/24, 5:33 PM

## 2024-09-17 ENCOUNTER — Other Ambulatory Visit: Payer: Self-pay | Admitting: Medical

## 2024-09-17 ENCOUNTER — Other Ambulatory Visit: Payer: Self-pay | Admitting: Physical Medicine and Rehabilitation

## 2024-09-17 DIAGNOSIS — I10 Essential (primary) hypertension: Secondary | ICD-10-CM

## 2024-09-20 ENCOUNTER — Ambulatory Visit

## 2024-09-20 DIAGNOSIS — Z741 Need for assistance with personal care: Secondary | ICD-10-CM

## 2024-09-20 DIAGNOSIS — R2689 Other abnormalities of gait and mobility: Secondary | ICD-10-CM

## 2024-09-20 DIAGNOSIS — R278 Other lack of coordination: Secondary | ICD-10-CM

## 2024-09-20 DIAGNOSIS — M6281 Muscle weakness (generalized): Secondary | ICD-10-CM | POA: Diagnosis not present

## 2024-09-20 DIAGNOSIS — R29898 Other symptoms and signs involving the musculoskeletal system: Secondary | ICD-10-CM

## 2024-09-20 DIAGNOSIS — G8929 Other chronic pain: Secondary | ICD-10-CM

## 2024-09-20 DIAGNOSIS — R29818 Other symptoms and signs involving the nervous system: Secondary | ICD-10-CM

## 2024-09-20 DIAGNOSIS — M24542 Contracture, left hand: Secondary | ICD-10-CM

## 2024-09-20 DIAGNOSIS — R2681 Unsteadiness on feet: Secondary | ICD-10-CM

## 2024-09-20 DIAGNOSIS — I69354 Hemiplegia and hemiparesis following cerebral infarction affecting left non-dominant side: Secondary | ICD-10-CM

## 2024-09-20 NOTE — Therapy (Signed)
 OUTPATIENT PHYSICAL THERAPY NEURO TREATMENT/PHYSICAL THERAPY PROGRESS NOTE   Dates of reporting period  07/21/24   to   09/20/24   Patient Name: Matthew Black MRN: [id number] DOB:01-12-1976, 48 y.o., male Today's Date: 09/20/2024   PCP: Dorina Loving, PA-C REFERRING PROVIDER: Lorilee Sven SQUIBB, MD  END OF SESSION:    PT End of Session - 09/20/24 1537     Visit Number 40    Number of Visits 59    Date for Recertification  11/16/24    Authorization Type [employer] auth #: [id number] for 8 PT vst from 10/07-11/04    Authorization - Number of Visits 8    PT Start Time 1537    PT Stop Time 1615    PT Time Calculation (min) 38 min    Equipment Utilized During Treatment Gait belt;Other (comment)   L LE AFO   Activity Tolerance Patient tolerated treatment well    Behavior During Therapy [facility] for tasks assessed/performed           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   Stroke (HCC)    RICA, R A1, R MCA occlusion 07/19/19   Past Surgical History:  Procedure Laterality Date   CRANIOPLASTY Right 06/05/2020   Procedure: CRANIOPLASTY;  Surgeon: Lanis Pupa, MD;  Location: [facility] OR;  Service: Neurosurgery;  Laterality: Right;  right   CRANIOTOMY Right 07/19/2019   Procedure: RIGHT HEMI-CRANIECTOMY With implantation of skull flap to abdominal wall;  Surgeon: Lanis Pupa, MD;  Location: [facility] OR;  Service: Neurosurgery;  Laterality: Right;   CYST EXCISION N/A 10/08/2016   Procedure: EXCISION OF POSTERIOR NECK CYST;  Surgeon: Mitzie DELENA Freund, MD;  Location: [facility] ORS;  Service:  General;  Laterality: N/A;   CYSTOSCOPY/URETEROSCOPY/HOLMIUM LASER/STENT PLACEMENT Right 03/16/2020   Procedure: CYSTOSCOPY RIGHT RETROGRADE PYELOGRAM URETEROSCOPY/HOLMIUM LASER/STENT PLACEMENT;  Surgeon: Carolee Sherwood JONETTA DOUGLAS, MD;  Location: [facility];  Service: Urology;  Laterality: Right;   INCISION AND DRAINAGE ABSCESS N/A 09/22/2014   Procedure: INCISION AND DRAINAGE ABSCESS POSTERIOR NECK;  Surgeon: Donnice KATHEE Lunger, MD;  Location: [facility];  Service: General;  Laterality: N/A;   INCISION AND DRAINAGE ABSCESS N/A 12/20/2015   Procedure: INCISION AND DRAINAGE POSTERIOR NECK MASS;  Surgeon: Krystal Spinner, MD;  Location: [facility];  Service: General;  Laterality: N/A;   INCISION AND DRAINAGE ABSCESS Left 07/10/2004   middle finger   IR IVC FILTER PLMT / S&I /IMG GUID/MOD SED  07/21/2019   IR RADIOLOGIST EVAL & MGMT  12/14/2019   IR RADIOLOGIST EVAL & MGMT  12/21/2020   IR VENOGRAM RENAL UNI RIGHT  07/21/2019   MASS EXCISION N/A 07/21/2017   Procedure: EXCISION OF BENIGN NECK LESION WITH LAYERED CLOSURE;  Surgeon: Arelia Filippo, MD;  Location: [facility];  Service: Plastics;  Laterality: N/A;   MASS EXCISION N/A 11/10/2017   Procedure: EXCISION BENIGN LESION OF THE NECK WITH LAYERED CLOSURE;  Surgeon: Arelia Filippo, MD;  Location: [facility];  Service: Plastics;  Laterality: N/A;   Patient Active  Problem List   Diagnosis Date Noted   Hx of adenomatous colonic polyps 06/01/2023   COVID-19 virus infection 04/21/2021   Left-sided weakness 04/21/2021   S/P craniotomy 06/05/2020   History of cranioplasty 06/05/2020   Peri-rectal abscess 02/14/2020   Abnormal CT scan, pelvis 02/14/2020   Pancytopenia (HCC) 12/02/2019   Nephrolithiasis 12/02/2019   Hydronephrosis with renal and ureteral calculus obstruction 12/02/2019   Rectal pain 12/02/2019   Hyperkalemia 12/02/2019   Reactive depression    Wound infection after surgery    Sleep disturbance    Dysphagia, post-stroke     Transaminitis    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   Cerebral abscess    Urinary tract infection without hematuria    Altered mental status    Primary hypercoagulable state    Acute pulmonary embolism without acute cor pulmonale (HCC)    Deep vein thrombosis (DVT) of non-extremity vein    Hypokalemia    Acute blood loss anemia    Leukocytosis    Endotracheal tube present    Acute respiratory failure with hypoxemia (HCC)    Stroke (cerebrum) (HCC) 07/19/2019   Pressure injury of skin 07/19/2019   Acute CVA (cerebrovascular accident) (HCC)    Encephalopathy    Dysphagia    Acute encephalopathy    Essential hypertension    Obesity 03/12/2016   Scalp abscess 12/20/2015   Neck abscess 12/20/2015   Pilonidal cyst 02/08/2013    ONSET DATE: stroke in August 2020  REFERRING DIAG: I63.9 (ICD-10-CM) - Cerebrovascular accident (CVA), unspecified mechanism (HCC)   THERAPY DIAG:    Muscle weakness (generalized)  Hemiplegia and hemiparesis following cerebral infarction affecting left non-dominant side (HCC)  Other lack of coordination  Unsteadiness on feet  Other abnormalities of gait and mobility  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)  Other symptoms and signs involving the nervous system  Other symptoms and signs involving the musculoskeletal system  Contracture, left hand  Need for assistance with personal care  Chronic left shoulder pain   Rationale for Evaluation and Treatment: Rehabilitation  SUBJECTIVE:                                                                                                                                                                                             SUBJECTIVE STATEMENT:   Pt reports no new complaints and feels blessed.  Pt reports that he is cold on the L affected side due to the weather change.  Of note: Botox  in L LE biceps femoris on 06/24/2024 Has  the Motus Nova Foot device  Initial Eval: Patient reports his goal is to walk without using a cane and without assistance. Patient states he wants to walk like he did before having the stroke. Reports his stroke was in August of 2020. States he currently uses a quad cane for ambulation and tries to walk as much as possible. Reports he has a manual wheelchair, but states he tries not to use it because it depresses him and makes him feel like he is back to when he first had his stroke. Reports wearing L LE custom AFO at all times.   Reports he is currently doing everything mod-I, but states he could call his mom, dad, or brothers if he needed assistance.  Reports he has routine botox  injections in L UE to help manage tone, but doesn't receive any in his LE.  Reports he has a hospital bed, but sleeps in a regular bed.  Pt accompanied by: self  PERTINENT HISTORY: L hemiparesis s/p R ACA and MCA territory CVA in August 2020, HTN, hx of kidney stones, depression  Per Neurology MD note on 11/24/2023: 18-year-old African-American male with large right hemispheric infarct due to right internal carotid and middle cerebral artery occlusion with cytotoxic edema and brain herniation s/p hemicraniectomy in 06/2019 is doing reasonably well with residual left spastic hemiparesis and left hemianopia.  Etiology of carotid occlusion unclear possibly dissection.  He had a prolonged hospital admission with several complications including DVT, pulmonary embolism, hemorrhagic transformation, abdominal wall hematoma, UTI but made quite remarkable recovery   PAIN:  Are you having pain? No, denies pain today, but states sometimes will have L shoulder pain  PRECAUTIONS: Fall  RED FLAGS: None   WEIGHT BEARING RESTRICTIONS: No  FALLS: Has patient fallen in last 6 months? Yes, stating he fell down his stairs ~5 months ago and is now scared to perform stair navigation  LIVING ENVIRONMENT: Lives with: lives  alone Lives in: House/apartment Stairs: Yes: Internal: flight steps; can reach both and External: 4 steps; can reach both, but states he doesn't have to go upstairs Has following equipment at home: Quad cane small base, Hemi walker, Wheelchair (manual), shower chair, Shower bench, and L LE custom AFO Pt has custom K5 wheelchair with rigid, contoured back support and Roho cushion. Pt reports he calls [employer] to come adjust his seat cushion when he feels it is getting uncomfortable.  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  DIAGNOSTIC FINDINGS:  EXAM: CT ANGIOGRAPHY HEAD AND NECK WITH AND WITHOUT CONTRAST IMPRESSION: 1. No acute intracranial abnormality. 2. Chronically occluded right ICA with minimal opacification of the right MCA and ACA branches. 3. Redemonstrated extensive encephalomalacia in the right MCA and ACA territories secondary to a prior infarct.  Electronically Signed   By: Lyndall Gore M.D.   On: 11/27/2023 13:45  COGNITION: Overall cognitive status: Within functional limits for tasks assessed  VISION (03/29/2024): Therapist performed visual field screen with pt appearing to have L homonomous hemianopsia - pt confirms he has loss of vision on L side.   SENSATION: Light touch: Impaired in L LE Proprioception: Impaired  in L LE  Can feel deep  pressure in L LE   COORDINATION: Impaired in L LE due to paresis, hypertonia, and overall decreased flexibility  EDEMA:  Not formally assessed  MUSCLE TONE: LLE: Mild, Moderate,  and Hypertonic  MUSCLE LENGTH: Not formally assessed; however, demonstrates L hamstring muscle tightness  DTRs:  Not formally assessed  POSTURE: rounded shoulders, forward head, and weight shift right  LOWER EXTREMITY ROM:       Right Eval Active ROM Left Eval Passive ROM  Hip flexion WFL Achieves at least 90  Hip extension    Hip abduction    Hip adduction    Hip internal rotation  Unable to achieve neutral hip rotation, lacking internal rotation  Hip external rotation  [facility] and rests in excessive ER  Knee flexion [facility]  Knee extension WFL Able to achieve terminal knee extension in supine, but with significant discomfort due to hamstring tone  Ankle dorsiflexion WFL Did not remove AFO to assess this date  Ankle plantarflexion WFL Did not remove AFO to assess this date  Ankle inversion    Ankle eversion     (Blank rows = not tested)  LOWER EXTREMITY MMT:    MMT Right Eval Left Eval  Hip flexion 4+ 2-  (compensates with hip adductor activation)  Hip extension    Hip abduction    Hip adduction  3+  Hip internal rotation  2-  Hip external rotation  2  Knee flexion 4+ 1  Knee extension 4+ 3+  Ankle dorsiflexion 4+ Did not remove AFO to assess this date  Ankle plantarflexion 4+ Did not remove AFO to assess this date  Ankle inversion    Ankle eversion    (Blank rows = not tested)  Manual Muscle Test Scale 0/5 = No muscle contraction can be seen or felt 1/5 = Contraction can be felt, but there is no motion 2-/5 = Part moves through incomplete ROM w/ gravity decreased 2/5 = Part moves through complete ROM w/ gravity decreased 2+/5 = Part moves through incomplete ROM (<50%) against gravity or through complete ROM w/ gravity 3-/5 = Part moves through incomplete ROM (>50%) against gravity 3/5 = Part moves through complete ROM against gravity 3+/5 = Part moves through complete ROM against gravity/slight resistance 4-/5= Holds test position against slight to moderate  pressure 4/5 = Part moves through complete ROM against gravity/moderate resistance 4+/5= Holds test position against moderate to strong pressure 5/5 = Part moves through complete ROM against gravity/full resistance  BED MOBILITY: (sleeps in regular bed, but has hospital bed available) Findings: Sit to supine CGA Supine to sit Min A and Mod A Rolling to Right Mod A Rolling to Left Min A and Mod A  TRANSFERS: Sit to stand: CGA and Min A  Assistive device utilized: Counselling psychologist     Stand to sit: CGA and Min A  Assistive device utilized: Tree Surgeon to chair: Min A  Assistive device utilized: Counselling psychologist       RAMP:  Not tested  CURB:  Not tested  STAIRS: Not tested *need to assess*  GAIT: Findings: advances L LE with excessive hip external rotation using hip adductors, step to pattern, decreased arm swing- Left, decreased step length- Right, decreased stance time- Left, decreased stride length, decreased hip/knee flexion- Left, decreased ankle dorsiflexion- Left, lateral lean- Right, and decreased trunk rotation Distance walked: ~67ft Assistive device utilized: small based quad cane in R UE  Level of  assistance: CGA and Min A  FUNCTIONAL TESTS:  5 times sit to stand: need to assess Timed up and go (TUG): need to assess 6 minute walk test: need to assess 10 meter walk test: 0.59m/s using small based quad cane and CGA Berg Balance Scale: need to assess  PATIENT SURVEYS:  Stroke Impact Scale 03/15/2024: 49/80                                                                                                                               TREATMENT DATE: 09/20/2024    Physical Performance Testing:  Five times Sit to Stand Test (FTSS)  TIME: 18.86 sec with UE support on SBQC  Cut off scores indicative of increased fall risk: >12 sec CVA, >16 sec PD, >13 sec vestibular (ANPTA Core Set of Outcome Measures for Adults with Neurologic Conditions,  2018)  10 Meter Walk Test: Patient instructed to walk 10 meters (32.8 ft) as quickly and as safely as possible at their normal speed Results: 0.4 m/s (25.10 seconds with SBQC)  Cut off scores:   Household Ambulator  < 0.4 m/s  Limited Community Ambulator  0.4 - 0.8 m/s  [employer]  > 0.8 m/s  Increased fall risk  < 1.39m/s  Crossing a Street  >1.20m/s  MCID 0.05 m/s (small), 0.13 m/s (moderate), 0.06 m/s (significant)  (ANPTA Core Set of Outcome Measures for Adults with Neurologic Conditions, 2018)     6 Min Walk Test:  Instructed patient to ambulate as quickly and as safely as possible for 6 minutes using LRAD. Patient was allowed to take standing rest breaks without stopping the test, but if the patient required a sitting rest break the clock would be stopped and the test would be over.  Results: 282 feet using a SBQC with close SBC/CGA. Results indicate that the patient has reduced endurance with ambulation compared to age matched norms.  Age Matched Norms (in meters): 32-69 yo M: 49 F: 75, 85-79 yo M: 73 F: 471, 33-89 yo M: 417 F: 392 MDC: 58.21 meters (190.98 feet) or 50 meters (ANPTA Core Set of Outcome Measures for Adults with Neurologic Conditions, 2018)  PT instructed pt in TUG: 24.7 sec ( >13.5 sec indicates increased fall risk)      PATIENT EDUCATION: Education details: Therapy POC, LTGs, HEP, importance of improving L LE flexibility, findings during assessment  Person educated: Patient Education method: Explanation and Handouts Education comprehension: verbalized understanding and needs further education  HOME EXERCISE PROGRAM:  Access Code: [email or web address] URL: [email or web address] Date: 03/10/2024 Prepared by: Connell Kiss  Exercises - Seated Hip Adduction Isometrics with Ball  - 1 x daily - 7 x weekly - 2 sets - 10 reps - 5 seconds hold - Supine Quadricep Sets  - 1 x daily - 7 x weekly - 2 sets - 10 reps - 3 seconds hold Use of  Motus Nova Foot device for at least 1hour every day  GOALS: Goals reviewed with patient? Yes  SHORT TERM GOALS: Target date: 07/13/2024  Patient will be independent in home exercise program to improve strength/mobility for better functional independence with ADLs.  Baseline: initiated on 03/10/2024 04/14/2024: continued Goal status: IN PROGRESS   LONG TERM GOALS: Target date: 11/16/2024  Patient (< 62 years old) will complete five times sit to stand (5XSTS) test in < 10 seconds indicating an increased LE strength and improved balance.  Baseline: 03/15/2024: 16.53 seconds from standard green chair relying on R hand to push-up from armrest and pt's personal SBQC nearby, but not using it VERSUS 25.85 seconds with R hand across chest, with min A for balance/safety without use of UE support 04/14/2024: 8.96 seconds using R UE support to push-up from armrest and pt's SBQC next to him, 13.75 seconds with R hand across chest with min A for therapist to stabilize chair behind him 05/30/24: 13.01 sec with R UE support on chair; 15.26 sec with R hand across chest and minA from therapist to stabilize chair behind pt 08/10/2024: 12.56 seconds using R UE support on chair armrest & 14.20 seconds with R UE across chest & chair against wall 09/20/24: 23.01 with no UE assistance and one episode of LOB; 18.86 with UE support on SBQC Goal status: IN PROGRESS  2.  Patient will increase Berg Balance score to > 45/56 to demonstrate improved balance and decreased fall risk during functional activities and ADLs.  Baseline: 03/15/2024: 14/56 04/14/2024: 22/56 05/30/24: TBD at next visit 07/27/2024: 33/56 Goal status: IN PROGRESS  3.  Patient will increase 10 meter walk test to >1.35m/s using LRAD as to improve gait speed for better community ambulation and to reduce fall risk.  Baseline: 0.30 m/s using small based quad cane with CGA and 1x light min A due to balance instability  04/14/2024: 0.405 m/s using SBQC with close SBA  for safety 05/30/24: 0.379 m/s using SBQC with close SBA for safety.  07/21/2024= 0.43 m/s with SBQC with close SBA 09/20/24: 0.40 m/s with SBQC and close SBA, 25.10 sec Goal status: IN PROGRESS  4.  Patient will increase six minute walk test distance to >589ft for progression towards community ambulator and improve gait ability  Baseline: 29ft with [facility]  04/20/2024:  23ft with SBQC and SBA 05/30/24: 360 ft with [facility] 08/10/2024: 416 feet (126.80 meters, Avg speed 0.76m/s) using SBQC mod-I. 09/20/24: 282' in 3 min 27 sec before needing to sit and terminate the test  Goal status: IN PROGRESS  5.  Patient will reduce timed up and go to <15 seconds to reduce fall risk and demonstrate improved transfer/gait ability. Baseline: 03/15/2024: 25.32 seconds using SBQC and chair with armrest as well as CGA for safety/steadying 04/14/2024: 22.25 seconds using SBQC & chair w/ armrest and close SBA for safety 05/30/24: 26.87 seconds w/ SBQC and use; 07/21/2024= 21.13 sec with SBQC and SBA 07/21/2024:  21.13 sec with SBQC and SBA  09/20/24: 24.7 sec with SBQC and SBA Goal status: IN PROGRESS  6.  Patient will ascend/descend 4 stairs, using railing, independently without loss of balance to improve ability to get in/out of home.   Baseline: 03/31/2024: using R UE support on each HR with skilled min assist for balance using primarily step-to pattern 04/20/2024:  using R UE support on each HR with skilled CGA for safety using step-to pattern 05/30/24: continued use of R UE support on HR with skilled CGA for safety using step-to pattern 08/10/2024: R UE support on HR, step-to  pattern, supervision only Goal status: IN PROGRESS  ASSESSMENT:  CLINICAL IMPRESSION:    Pt noted some fatigue and difficulty performing the tests as noted above, which pt attributes to the colder weather outside.  Pt notes that the L side of the body feels weaker, more painful, and colder today, which the pt reports for his reduction in his goal  assessment.  Pt ultimately has had a decline in the goals assessed as noted above, but will continue to improve to promote independence.  Patient's condition has the potential to improve in response to therapy. Maximum improvement is yet to be obtained. The anticipated improvement is attainable and reasonable in a generally predictable time.   Pt will continue to benefit from skilled therapy to address remaining deficits in order to improve overall QoL and return to PLOF.       OBJECTIVE IMPAIRMENTS: Abnormal gait, cardiopulmonary status limiting activity, decreased activity tolerance, decreased balance, decreased coordination, decreased endurance, decreased knowledge of use of DME, decreased mobility, difficulty walking, decreased ROM, decreased strength, hypomobility, impaired flexibility, impaired sensation, impaired tone, impaired UE functional use, improper body mechanics, postural dysfunction, and pain.   ACTIVITY LIMITATIONS: carrying, lifting, bending, standing, squatting, sleeping, stairs, transfers, bed mobility, continence, bathing, toileting, dressing, reach over head, hygiene/grooming, locomotion level, and caring for others  PARTICIPATION LIMITATIONS: meal prep, cleaning, laundry, medication management, shopping, community activity, and yard work  PERSONAL FACTORS: Age, Past/current experiences, Time since onset of injury/illness/exacerbation, Transportation, and 3+ comorbidities: HTN, hx of kidney stones, depression are also affecting patient's functional outcome.   REHAB POTENTIAL: Good  CLINICAL DECISION MAKING: Evolving/moderate complexity  EVALUATION COMPLEXITY: Moderate  PLAN:  PT FREQUENCY: 1-2x/week  PT DURATION: 12 weeks  PLANNED INTERVENTIONS: [id number]- PT Re-evaluation, 97750- Physical Performance Testing, 97110-Therapeutic exercises, 97530- Therapeutic activity, [id number]- Neuromuscular re-education, 97535- Self Care, [id number]- Manual therapy, [id number]- Gait training, [phone]-  Orthotic/Prosthetic subsequent, [phone]- Canalith repositioning, [phone]- Electrical stimulation (manual), Patient/Family education, Balance training, Stair training, Taping, Dry Needling, Joint mobilization, Joint manipulation, Vestibular training, Visual/preceptual remediation/compensation, DME instructions, Cryotherapy, Moist heat, and Biofeedback  PLAN FOR NEXT SESSION:  Progress note*   *HIGT on treadmill* Continue +2 A for L wt shift & increase harness support - goal to decrease reliance on R UE support Increasing speed and gradually work towards adding L LE AW or variable direction walking or no R UE support  Progress HEP  high intensity gait training using +2 HHA vs litegait support vs SBQC continue adding 7lb AW to L LE when appropriate vs decreasing RUE support/reliance while in litegait Backwards gait training - continue Stepping over obstacles - continue Continue stair training for L LE NMR For stance   R foot taps while sustaining L hip extension For swing phase Reciprocal foot taps L LE stretching and weightbearing for tone management and to improve alignment during gait/standing (avoid excessive hip ER) Non gait training ideas: bridges with R foot placed on unstable surface to promote increased L LE wt bearing    Fonda Simpers, PT, DPT Physical Therapist - [facility]  09/20/24, 5:40 PM

## 2024-09-21 ENCOUNTER — Ambulatory Visit

## 2024-09-26 ENCOUNTER — Encounter: Admitting: Physical Medicine and Rehabilitation

## 2024-09-27 ENCOUNTER — Ambulatory Visit: Attending: Physical Medicine and Rehabilitation | Admitting: Physical Therapy

## 2024-09-27 DIAGNOSIS — R2689 Other abnormalities of gait and mobility: Secondary | ICD-10-CM | POA: Insufficient documentation

## 2024-09-27 DIAGNOSIS — M6281 Muscle weakness (generalized): Secondary | ICD-10-CM | POA: Diagnosis present

## 2024-09-27 DIAGNOSIS — R2681 Unsteadiness on feet: Secondary | ICD-10-CM | POA: Insufficient documentation

## 2024-09-27 DIAGNOSIS — R29898 Other symptoms and signs involving the musculoskeletal system: Secondary | ICD-10-CM | POA: Diagnosis present

## 2024-09-27 DIAGNOSIS — R278 Other lack of coordination: Secondary | ICD-10-CM | POA: Diagnosis present

## 2024-09-27 DIAGNOSIS — R29818 Other symptoms and signs involving the nervous system: Secondary | ICD-10-CM | POA: Insufficient documentation

## 2024-09-27 DIAGNOSIS — I69354 Hemiplegia and hemiparesis following cerebral infarction affecting left non-dominant side: Secondary | ICD-10-CM | POA: Insufficient documentation

## 2024-09-27 NOTE — Therapy (Signed)
 OUTPATIENT PHYSICAL THERAPY NEURO TREATMENT   Patient Name: Matthew Black MRN: [id number] DOB:09-Sep-1976, 48 y.o., male Today's Date: 09/27/2024   PCP: Dorina Loving, PA-C REFERRING PROVIDER: Lorilee Sven SQUIBB, MD  END OF SESSION:    PT End of Session - 09/27/24 1451     Visit Number 41    Number of Visits 59    Date for Recertification  11/16/24    Authorization Type [employer] auth #: [id number] for 8 PT vst from 10/07-11/04    Authorization - Number of Visits 8    PT Start Time 1448    PT Stop Time 1533    PT Time Calculation (min) 45 min    Equipment Utilized During Treatment Gait belt;Other (comment)   L LE AFO   Activity Tolerance Patient tolerated treatment well    Behavior During Therapy [facility] for tasks assessed/performed            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   Stroke (HCC)    RICA, R A1, R MCA occlusion 07/19/19   Past Surgical History:  Procedure Laterality Date   CRANIOPLASTY Right 06/05/2020   Procedure: CRANIOPLASTY;  Surgeon: Lanis Pupa, MD;  Location: [facility] OR;  Service: Neurosurgery;  Laterality: Right;  right   CRANIOTOMY Right 07/19/2019   Procedure: RIGHT HEMI-CRANIECTOMY With implantation of skull flap to abdominal wall;  Surgeon: Lanis Pupa, MD;  Location: [facility] OR;  Service: Neurosurgery;  Laterality: Right;   CYST EXCISION N/A 10/08/2016   Procedure: EXCISION OF POSTERIOR NECK CYST;  Surgeon: Mitzie DELENA Freund, MD;  Location: [facility] ORS;  Service: General;  Laterality: N/A;   CYSTOSCOPY/URETEROSCOPY/HOLMIUM LASER/STENT PLACEMENT Right  03/16/2020   Procedure: CYSTOSCOPY RIGHT RETROGRADE PYELOGRAM URETEROSCOPY/HOLMIUM LASER/STENT PLACEMENT;  Surgeon: Carolee Sherwood JONETTA DOUGLAS, MD;  Location: [facility];  Service: Urology;  Laterality: Right;   INCISION AND DRAINAGE ABSCESS N/A 09/22/2014   Procedure: INCISION AND DRAINAGE ABSCESS POSTERIOR NECK;  Surgeon: Donnice KATHEE Lunger, MD;  Location: [facility];  Service: General;  Laterality: N/A;   INCISION AND DRAINAGE ABSCESS N/A 12/20/2015   Procedure: INCISION AND DRAINAGE POSTERIOR NECK MASS;  Surgeon: Krystal Spinner, MD;  Location: [facility];  Service: General;  Laterality: N/A;   INCISION AND DRAINAGE ABSCESS Left 07/10/2004   middle finger   IR IVC FILTER PLMT / S&I /IMG GUID/MOD SED  07/21/2019   IR RADIOLOGIST EVAL & MGMT  12/14/2019   IR RADIOLOGIST EVAL & MGMT  12/21/2020   IR VENOGRAM RENAL UNI RIGHT  07/21/2019   MASS EXCISION N/A 07/21/2017   Procedure: EXCISION OF BENIGN NECK LESION WITH LAYERED CLOSURE;  Surgeon: Arelia Filippo, MD;  Location: [facility];  Service: Plastics;  Laterality: N/A;   MASS EXCISION N/A 11/10/2017   Procedure: EXCISION BENIGN LESION OF THE NECK WITH LAYERED CLOSURE;  Surgeon: Arelia Filippo, MD;  Location:  SURGERY CENTER;  Service: Plastics;  Laterality: N/A;   Patient Active Problem List   Diagnosis Date Noted   Hx of adenomatous colonic polyps 06/01/2023  COVID-19 virus infection 04/21/2021   Left-sided weakness 04/21/2021   S/P craniotomy 06/05/2020   History of cranioplasty 06/05/2020   Peri-rectal abscess 02/14/2020   Abnormal CT scan, pelvis 02/14/2020   Pancytopenia (HCC) 12/02/2019   Nephrolithiasis 12/02/2019   Hydronephrosis with renal and ureteral calculus obstruction 12/02/2019   Rectal pain 12/02/2019   Hyperkalemia 12/02/2019   Reactive depression    Wound infection after surgery    Sleep disturbance    Dysphagia, post-stroke    Transaminitis    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   Cerebral  abscess    Urinary tract infection without hematuria    Altered mental status    Primary hypercoagulable state    Acute pulmonary embolism without acute cor pulmonale (HCC)    Deep vein thrombosis (DVT) of non-extremity vein    Hypokalemia    Acute blood loss anemia    Leukocytosis    Endotracheal tube present    Acute respiratory failure with hypoxemia (HCC)    Stroke (cerebrum) (HCC) 07/19/2019   Pressure injury of skin 07/19/2019   Acute CVA (cerebrovascular accident) (HCC)    Encephalopathy    Dysphagia    Acute encephalopathy    Essential hypertension    Obesity 03/12/2016   Scalp abscess 12/20/2015   Neck abscess 12/20/2015   Pilonidal cyst 02/08/2013    ONSET DATE: stroke in August 2020  REFERRING DIAG: I63.9 (ICD-10-CM) - Cerebrovascular accident (CVA), unspecified mechanism (HCC)   THERAPY DIAG:    Muscle weakness (generalized)  Hemiplegia and hemiparesis following cerebral infarction affecting left non-dominant side (HCC)  Other lack of coordination  Unsteadiness on feet  Other abnormalities of gait and mobility  Other symptoms and signs involving the musculoskeletal system  Other symptoms and signs involving the nervous system  Spastic hemiplegia of left nondominant side as late effect of cerebral infarction [facility])   Rationale for Evaluation and Treatment: Rehabilitation  SUBJECTIVE:                                                                                                                                                                                             SUBJECTIVE STATEMENT:   Pt reports he has been feeling okay the last couple days. Pt denies any falls or stumbles. Pt denies any additional updates.    Of note: Botox  in L LE biceps femoris on 06/24/2024 Has the Motus Nova Foot device    Initial Eval: Patient reports his goal is to walk without using a cane and without assistance. Patient states he wants to walk like he did before  having the stroke. Reports his stroke  was in August of 2020. States he currently uses a quad cane for ambulation and tries to walk as much as possible. Reports he has a manual wheelchair, but states he tries not to use it because it depresses him and makes him feel like he is back to when he first had his stroke. Reports wearing L LE custom AFO at all times.   Reports he is currently doing everything mod-I, but states he could call his mom, dad, or brothers if he needed assistance.  Reports he has routine botox  injections in L UE to help manage tone, but doesn't receive any in his LE.  Reports he has a hospital bed, but sleeps in a regular bed.  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  Etiology of carotid occlusion unclear possibly dissection.  He had a prolonged hospital admission with several complications including DVT, pulmonary embolism, hemorrhagic transformation, abdominal wall hematoma, UTI but made quite remarkable recovery   PAIN:  Are you having pain? No, denies pain today, but states sometimes will have L shoulder pain  PRECAUTIONS: Fall  RED FLAGS: None   WEIGHT BEARING RESTRICTIONS: No  FALLS: Has patient fallen in last 6 months? Yes, stating he fell down his stairs ~5 months ago and is now scared to perform stair navigation  LIVING ENVIRONMENT: Lives with: lives alone Lives in: House/apartment Stairs: Yes: Internal: flight steps; can reach both and External: 4 steps; can reach both, but states he doesn't have to go upstairs Has following  equipment at home: Quad cane small base, Hemi walker, Wheelchair (manual), shower chair, Shower bench, and L LE custom AFO Pt has custom K5 wheelchair with rigid, contoured back support and Roho cushion. Pt reports he calls [employer] to come adjust his seat cushion when he feels it is getting uncomfortable.  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  DIAGNOSTIC FINDINGS:  EXAM: CT ANGIOGRAPHY HEAD AND NECK WITH AND WITHOUT CONTRAST IMPRESSION: 1. No acute intracranial abnormality. 2. Chronically occluded right ICA with minimal opacification of the right MCA and ACA branches. 3. Redemonstrated extensive encephalomalacia in the right MCA and ACA territories secondary to a prior infarct.  Electronically Signed   By: Lyndall Gore M.D.   On: 11/27/2023 13:45  COGNITION: Overall cognitive status: Within functional limits for tasks assessed  VISION (03/29/2024): Therapist performed visual field screen with pt appearing to have L homonomous hemianopsia - pt confirms he has loss of vision on L side.   SENSATION: Light touch: Impaired in L LE Proprioception: Impaired  in L LE  Can feel deep pressure in L LE   COORDINATION: Impaired in L LE due to paresis, hypertonia, and overall decreased flexibility  EDEMA:  Not formally assessed  MUSCLE TONE: LLE: Mild, Moderate, and  Hypertonic  MUSCLE LENGTH: Not formally assessed; however, demonstrates L hamstring muscle tightness  DTRs:  Not formally assessed  POSTURE: rounded shoulders, forward  head, and weight shift right  LOWER EXTREMITY ROM:       Right Eval Active ROM Left Eval Passive ROM  Hip flexion WFL Achieves at least 90  Hip extension    Hip abduction    Hip adduction    Hip internal rotation  Unable to achieve neutral hip rotation, lacking internal rotation  Hip external rotation  [facility] and rests in excessive ER  Knee flexion [facility]  Knee extension WFL Able to achieve terminal knee extension in supine, but with significant discomfort due to hamstring tone  Ankle dorsiflexion WFL Did not remove AFO to assess this date  Ankle plantarflexion WFL Did not remove AFO to assess this date  Ankle inversion    Ankle eversion     (Blank rows = not tested)  LOWER EXTREMITY MMT:    MMT Right Eval Left Eval  Hip flexion 4+ 2-  (compensates with hip adductor activation)  Hip extension    Hip abduction    Hip adduction  3+  Hip internal rotation  2-  Hip external rotation  2  Knee flexion 4+ 1  Knee extension 4+ 3+  Ankle dorsiflexion 4+ Did not remove AFO to assess this date  Ankle plantarflexion 4+ Did not remove AFO to assess this date  Ankle inversion    Ankle eversion    (Blank rows = not tested)  Manual Muscle Test Scale 0/5 = No muscle contraction can be seen or felt 1/5 = Contraction can be felt, but there is no motion 2-/5 = Part moves through incomplete ROM w/ gravity decreased 2/5 = Part moves through complete ROM w/ gravity decreased 2+/5 = Part moves through incomplete ROM (<50%) against gravity or through complete ROM w/ gravity 3-/5 = Part moves through incomplete ROM (>50%) against gravity 3/5 = Part moves through complete ROM against gravity 3+/5 = Part moves through complete ROM against gravity/slight resistance 4-/5= Holds test position against slight to moderate pressure 4/5 = Part moves through complete ROM against gravity/moderate resistance 4+/5= Holds test position against moderate to strong pressure 5/5 = Part moves through complete  ROM against gravity/full resistance  BED MOBILITY: (sleeps in regular bed, but has hospital bed available) Findings: Sit to supine CGA Supine to sit Min A and Mod A Rolling to Right Mod A Rolling to Left Min A and Mod A  TRANSFERS: Sit to stand: CGA and Min A  Assistive device utilized: Counselling psychologist     Stand to sit: CGA and Min A  Assistive device utilized: Tree Surgeon to chair: Min A  Assistive device utilized: Counselling psychologist       RAMP:  Not tested  CURB:  Not tested  STAIRS: Not tested *need to assess*  GAIT: Findings: advances L LE with excessive hip external rotation using hip adductors, step to pattern, decreased arm swing- Left, decreased step length- Right, decreased stance time- Left, decreased stride length, decreased hip/knee flexion- Left, decreased ankle dorsiflexion- Left, lateral lean- Right, and decreased trunk rotation Distance walked: ~35ft Assistive device utilized: small based quad cane in R UE  Level of assistance: CGA and Min A  FUNCTIONAL TESTS:  5 times sit to stand: need to assess Timed up and go (TUG): need to assess 6 minute walk test: need to assess  10 meter walk test: 0.74m/s using small based quad cane and CGA Berg Balance Scale: need to assess  PATIENT SURVEYS:  Stroke Impact Scale 03/15/2024: 49/80                                                                                                                               TREATMENT DATE: 09/27/2024   Gait into therapy clinic using [facility] mod-I with only slight step-through pattern leading with L LE.   Donned litegait harness with R UE support on litegait handle. Of note, pt requiring additional seated rest break with donning today.   Gait training the following trials on treadmill in litegait harness, not providing BWS, only used for balance: *Pt using R UE support on litegait handle throughout* 4 min 15 sec at 0.8 mph, totaling 293' Light mod, progressing  to min facilitation to increase L weight shift in L stance phase, minA for facilitation of LLE foot positioning on treadmill, improved swing Provided verbal cueing for increased swing, kick then shift  Pt continuing to demo decreased stepping at L side runner of treadmill this date RPE 16/20 3 min at 0.8, totaling 209' Continued to progress to min facilitation for L weight shift in L stance phase; no facilitation of LLE for postiioning on treadmill this bout Pt provided external cue with auditory feedback (good) for L weight shift, continued to provide verbal cueing for kick then shift  RPE 17/20  No reports of pain after gait training on treadmill   Patient will benefit from continued use of +2 A to facilitate weight shift onto L stance limb, as well as potential benefit from increased harness support to allow pt to feel confident with further decreasing support through R UE.   Stepped off treadmill in litegait harness for balance safety and therapist providing min manual facilitation for L LE management.   PATIENT EDUCATION: Education details: Therapy POC, LTGs, HEP, importance of improving L LE flexibility, findings during assessment  Person educated: Patient Education method: Explanation and Handouts Education comprehension: verbalized understanding and needs further education  HOME EXERCISE PROGRAM:  Access Code: [email or web address] URL: [email or web address] Date: 03/10/2024 Prepared by: Connell Kiss  Exercises - Seated Hip Adduction Isometrics with Ball  - 1 x daily - 7 x weekly - 2 sets - 10 reps - 5 seconds hold - Supine Quadricep Sets  - 1 x daily - 7 x weekly - 2 sets - 10 reps - 3 seconds hold Use of Motus Nova Foot device for at least 1hour every day  GOALS: Goals reviewed with patient? Yes  SHORT TERM GOALS: Target date: 07/13/2024  Patient will be independent in home exercise program to improve strength/mobility for better functional independence with  ADLs.  Baseline: initiated on 03/10/2024 04/14/2024: continued Goal status: IN PROGRESS   LONG TERM GOALS: Target date: 11/16/2024  Patient (< 67 years old) will complete five times sit to stand (5XSTS) test in < 10 seconds indicating  an increased LE strength and improved balance.  Baseline: 03/15/2024: 16.53 seconds from standard green chair relying on R hand to push-up from armrest and pt's personal SBQC nearby, but not using it VERSUS 25.85 seconds with R hand across chest, with min A for balance/safety without use of UE support 04/14/2024: 8.96 seconds using R UE support to push-up from armrest and pt's SBQC next to him, 13.75 seconds with R hand across chest with min A for therapist to stabilize chair behind him 05/30/24: 13.01 sec with R UE support on chair; 15.26 sec with R hand across chest and minA from therapist to stabilize chair behind pt 08/10/2024: 12.56 seconds using R UE support on chair armrest & 14.20 seconds with R UE across chest & chair against wall 09/20/24: 23.01 with no UE assistance and one episode of LOB; 18.86 with UE support on SBQC Goal status: IN PROGRESS  2.  Patient will increase Berg Balance score to > 45/56 to demonstrate improved balance and decreased fall risk during functional activities and ADLs.  Baseline: 03/15/2024: 14/56 04/14/2024: 22/56 05/30/24: TBD at next visit 07/27/2024: 33/56 Goal status: IN PROGRESS  3.  Patient will increase 10 meter walk test to >1.67m/s using LRAD as to improve gait speed for better community ambulation and to reduce fall risk.  Baseline: 0.30 m/s using small based quad cane with CGA and 1x light min A due to balance instability  04/14/2024: 0.405 m/s using SBQC with close SBA for safety 05/30/24: 0.379 m/s using SBQC with close SBA for safety.  07/21/2024= 0.43 m/s with SBQC with close SBA 09/20/24: 0.40 m/s with SBQC and close SBA, 25.10 sec Goal status: IN PROGRESS  4.  Patient will increase six minute walk test distance to >54ft  for progression towards community ambulator and improve gait ability  Baseline: 250ft with [facility]  04/20/2024:  226ft with SBQC and SBA 05/30/24: 360 ft with [facility] 08/10/2024: 416 feet (126.80 meters, Avg speed 0.45m/s) using SBQC mod-I. 09/20/24: 282' in 3 min 27 sec before needing to sit and terminate the test  Goal status: IN PROGRESS  5.  Patient will reduce timed up and go to <15 seconds to reduce fall risk and demonstrate improved transfer/gait ability. Baseline: 03/15/2024: 25.32 seconds using SBQC and chair with armrest as well as CGA for safety/steadying 04/14/2024: 22.25 seconds using SBQC & chair w/ armrest and close SBA for safety 05/30/24: 26.87 seconds w/ SBQC and use; 07/21/2024= 21.13 sec with SBQC and SBA 07/21/2024:  21.13 sec with SBQC and SBA  09/20/24: 24.7 sec with SBQC and SBA Goal status: IN PROGRESS  6.  Patient will ascend/descend 4 stairs, using railing, independently without loss of balance to improve ability to get in/out of home.   Baseline: 03/31/2024: using R UE support on each HR with skilled min assist for balance using primarily step-to pattern 04/20/2024:  using R UE support on each HR with skilled CGA for safety using step-to pattern 05/30/24: continued use of R UE support on HR with skilled CGA for safety using step-to pattern 08/10/2024: R UE support on HR, step-to pattern, supervision only Goal status: IN PROGRESS  ASSESSMENT:  CLINICAL IMPRESSION:    Continued to note fatigue today, but not limiting participation with higher intensity gait training on treadmill with use of litegait harness for balance support and safety. Continued to focus on increased L weight shift onto L stance limb, able to progress from heavy mod to min facilitation.  Plan to continue HIGT on treadmill with  use of +2 A to improve L wt shift during L stance as well as increase harness support to decrease reliance on R UE support. Patient remains highly motivated to participate in therapy and is  eager to continue participating in HIGT on the treadmill, which is an intervention that has been shown to have significant impact on gait speed and endurance. Continue with POC as described pending additional insurance approval to achieve LTGs.  Pt will continue to benefit from skilled therapy to address remaining deficits in order to improve overall QoL and return to PLOF.       OBJECTIVE IMPAIRMENTS: Abnormal gait, cardiopulmonary status limiting activity, decreased activity tolerance, decreased balance, decreased coordination, decreased endurance, decreased knowledge of use of DME, decreased mobility, difficulty walking, decreased ROM, decreased strength, hypomobility, impaired flexibility, impaired sensation, impaired tone, impaired UE functional use, improper body mechanics, postural dysfunction, and pain.   ACTIVITY LIMITATIONS: carrying, lifting, bending, standing, squatting, sleeping, stairs, transfers, bed mobility, continence, bathing, toileting, dressing, reach over head, hygiene/grooming, locomotion level, and caring for others  PARTICIPATION LIMITATIONS: meal prep, cleaning, laundry, medication management, shopping, community activity, and yard work  PERSONAL FACTORS: Age, Past/current experiences, Time since onset of injury/illness/exacerbation, Transportation, and 3+ comorbidities: HTN, hx of kidney stones, depression are also affecting patient's functional outcome.   REHAB POTENTIAL: Good  CLINICAL DECISION MAKING: Evolving/moderate complexity  EVALUATION COMPLEXITY: Moderate  PLAN:  PT FREQUENCY: 1-2x/week  PT DURATION: 12 weeks  PLANNED INTERVENTIONS: [id number]- PT Re-evaluation, 97750- Physical Performance Testing, 97110-Therapeutic exercises, 97530- Therapeutic activity, [id number]- Neuromuscular re-education, 97535- Self Care, [id number]- Manual therapy, [id number]- Gait training, [id number]- Orthotic/Prosthetic subsequent, [phone]- Canalith repositioning, [phone]- Electrical stimulation (manual),  Patient/Family education, Balance training, Stair training, Taping, Dry Needling, Joint mobilization, Joint manipulation, Vestibular training, Visual/preceptual remediation/compensation, DME instructions, Cryotherapy, Moist heat, and Biofeedback  PLAN FOR NEXT SESSION:   *HIGT on treadmill* Continue +2 A for L wt shift & increase harness support - goal to decrease reliance on R UE support Increasing speed and gradually work towards adding L LE AW or variable direction walking or no R UE support  Progress HEP  high intensity gait training using +2 HHA vs litegait support vs SBQC continue adding 7lb AW to L LE when appropriate vs decreasing RUE support/reliance while in litegait Backwards gait training - continue Stepping over obstacles - continue Continue stair training for L LE NMR For stance   R foot taps while sustaining L hip extension For swing phase Reciprocal foot taps L LE stretching and weightbearing for tone management and to improve alignment during gait/standing (avoid excessive hip ER) Non gait training ideas: bridges with R foot placed on unstable surface to promote increased L LE wt bearing    Chiquita Silvan, SPT Physical Therapy Student - [facility]  [facility]  09/27/24, 6:07 PM

## 2024-09-28 ENCOUNTER — Ambulatory Visit: Admitting: Physical Therapy

## 2024-09-29 ENCOUNTER — Ambulatory Visit: Admitting: Physical Therapy

## 2024-10-04 ENCOUNTER — Encounter (HOSPITAL_COMMUNITY): Payer: Self-pay

## 2024-10-04 ENCOUNTER — Ambulatory Visit

## 2024-10-04 ENCOUNTER — Emergency Department (HOSPITAL_COMMUNITY)

## 2024-10-04 ENCOUNTER — Other Ambulatory Visit: Payer: Self-pay

## 2024-10-04 ENCOUNTER — Emergency Department (HOSPITAL_COMMUNITY)
Admission: EM | Admit: 2024-10-04 | Discharge: 2024-10-05 | Disposition: A | Attending: Emergency Medicine | Admitting: Emergency Medicine

## 2024-10-04 DIAGNOSIS — M542 Cervicalgia: Secondary | ICD-10-CM | POA: Diagnosis present

## 2024-10-04 DIAGNOSIS — W19XXXA Unspecified fall, initial encounter: Secondary | ICD-10-CM

## 2024-10-04 DIAGNOSIS — R519 Headache, unspecified: Secondary | ICD-10-CM | POA: Diagnosis not present

## 2024-10-04 DIAGNOSIS — R531 Weakness: Secondary | ICD-10-CM | POA: Diagnosis not present

## 2024-10-04 DIAGNOSIS — W1830XA Fall on same level, unspecified, initial encounter: Secondary | ICD-10-CM | POA: Insufficient documentation

## 2024-10-04 DIAGNOSIS — M79622 Pain in left upper arm: Secondary | ICD-10-CM | POA: Diagnosis not present

## 2024-10-04 DIAGNOSIS — Z7982 Long term (current) use of aspirin: Secondary | ICD-10-CM | POA: Diagnosis not present

## 2024-10-04 MED ORDER — ACETAMINOPHEN 325 MG PO TABS
650.0000 mg | ORAL_TABLET | Freq: Once | ORAL | Status: AC
  Administered 2024-10-04: 650 mg via ORAL
  Filled 2024-10-04: qty 2

## 2024-10-04 NOTE — ED Provider Triage Note (Signed)
 Emergency Medicine Provider Triage Evaluation Note  DERAY DAWES , a 48 y.o. male  was evaluated in triage.  Pt complains of tripping and falling onto his left side today.  Patient has a history of bilateral DVT status post IVC filter, prior right MCA stroke with left-sided weakness.  He says he did think he struck his head.  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  He is not on anticoagulation  Review of Systems  Positive: Headache, neck pain Negative: Loss of consciousness  Physical Exam  There were no vitals taken for this visit. Gen:   Awake, no distress   Resp:  Normal effort  MSK:   Chronically contracture left upper extremity with some mild tenderness to left shoulder with attempted range of motion.  No spinal cervical midline tenderness.  Mild generalized tenderness of the left hip with range of motion testing but the patient is able to actively lift it off the ground.  No clear evidence of head trauma  Medical Decision Making  Medically screening exam initiated at 8:00 PM.  Appropriate orders placed.  CRANSTON KOORS was informed that the remainder of the evaluation will be completed by another provider, this initial triage assessment does not replace that evaluation, and the importance of remaining in the ED until their evaluation is complete.  Focused x-ray imaging of the left shoulder and the left hip as well as CT imaging of the head and cervical spine for mechanical fall today.  Overall left-sided weakness appears to be at baseline.   Cottie Donnice PARAS, MD 10/04/24 2001

## 2024-10-04 NOTE — ED Triage Notes (Signed)
 Pt arrived via [facility] s/p mechanical fall. Pt fell whine getting out of SUV landing on left side. Pt c/o neck pain, headache,  and left upper arm. Per EMS they were unable to get C-collar on because it did not fit.

## 2024-10-05 NOTE — ED Provider Notes (Signed)
 [facility] Provider Note   CSN: [id number] Arrival date & time: 10/04/24  1955     Patient presents with: Matthew Black   Matthew Black Black is a 48 y.o. male.   Presents to the emergency department for evaluation after a fall.  Patient has chronic deficits secondary to prior stroke.  He is on blood thinners.  He fell onto his left side, currently reports that he has no pain or discomfort.       Prior to Admission medications   Medication Sig Start Date End Date Taking? Authorizing Provider  ASPIRIN  LOW DOSE 81 MG tablet TAKE 1 TABLET (81 MG TOTAL) BY MOUTH 5 (FIVE) TIMES DAILY. NEEDS APPT 09/02/24   Saguier, [place], PA-C  atorvastatin  (LIPITOR) 10 MG tablet TAKE 1 TABLET BY MOUTH DAILY 09/19/24   Saguier, [place], PA-C  baclofen  (LIORESAL ) 10 MG tablet Take 1 tablet (10 mg total) by mouth 3 (three) times daily. 02/03/24   Raulkar, Sven SQUIBB, MD  metoprolol  tartrate (LOPRESSOR ) 50 MG tablet TAKE 1 TABLET BY MOUTH TWICE  DAILY 09/19/24   Raulkar, Sven SQUIBB, MD  tiZANidine  (ZANAFLEX ) 4 MG tablet Take 1 tablet (4 mg total) by mouth at bedtime as needed for muscle spasms. 05/22/22   Raulkar, Sven SQUIBB, MD    Allergies: Hydrocodone     Review of Systems  Updated Vital Signs BP 90/60 (BP Location: Right Arm)   Pulse 71   Temp 97.9 F (36.6 C)   Resp 16   Ht 5' 11 (1.803 m)   Wt 117.9 kg   SpO2 100%   BMI 36.26 kg/m   Physical Exam Vitals and nursing note reviewed.  Constitutional:      General: He is not in acute distress.    Appearance: He is well-developed.  HENT:     Head: Normocephalic and atraumatic.     Mouth/Throat:     Mouth: Mucous membranes are moist.  Eyes:     General: Vision grossly intact. Gaze aligned appropriately.     Extraocular Movements: Extraocular movements intact.     Conjunctiva/sclera: Conjunctivae normal.  Cardiovascular:     Rate and Rhythm: Normal rate and regular rhythm.     Pulses: Normal pulses.      Heart sounds: Normal heart sounds, S1 normal and S2 normal. No murmur heard.    No friction rub. No gallop.  Pulmonary:     Effort: Pulmonary effort is normal. No respiratory distress.     Breath sounds: Normal breath sounds.  Abdominal:     Palpations: Abdomen is soft.     Tenderness: There is no abdominal tenderness. There is no guarding or rebound.     Hernia: No hernia is present.  Musculoskeletal:        General: No swelling.     Cervical back: Full passive range of motion without pain, normal range of motion and neck supple. No pain with movement, spinous process tenderness or muscular tenderness. Normal range of motion.     Right lower leg: No edema.     Left lower leg: No edema.  Skin:    General: Skin is warm and dry.     Capillary Refill: Capillary refill takes less than 2 seconds.     Findings: No ecchymosis, erythema, lesion or wound.  Neurological:     Mental Status: He is alert and oriented to person, place, and time.     GCS: GCS eye subscore is 4. GCS verbal subscore is 5.  GCS motor subscore is 6.     Cranial Nerves: Cranial nerves 2-12 are intact.     Sensory: Sensation is intact.     Motor: Weakness (Left hemiparesis) present.     Coordination: Coordination is intact.  Psychiatric:        Mood and Affect: Mood normal.        Speech: Speech normal.        Behavior: Behavior normal.     (all labs ordered are listed, but only abnormal results are displayed) Labs Reviewed - No data to display  EKG: None  Radiology: DG Hip Unilat W or Wo Pelvis 2-3 Views Left Result Date: 10/04/2024 EXAM: 2 OR 3 VIEW(S) XRAY OF THE LEFT HIP 10/04/2024 09:08:00 PM COMPARISON: None available. CLINICAL HISTORY: Fall onto left side, left hip pain. FINDINGS: BONES AND JOINTS: No acute fracture or focal osseous lesion. The hip joint is maintained. No significant degenerative changes. SOFT TISSUES: The soft tissues are unremarkable. IMPRESSION: 1. No significant abnormality in the left  hip or visualized pelvis. Electronically signed by: Franky Crease MD 10/04/2024 09:11 PM EST RP Workstation: [id number]   DG Shoulder Left Result Date: 10/04/2024 EXAM: 1 VIEW XRAY OF THE LEFT SHOULDER 10/04/2024 09:08:00 PM COMPARISON: None available. CLINICAL HISTORY: fall onto shoulder FINDINGS: BONES AND JOINTS: Glenohumeral joint is normally aligned. No acute fracture or dislocation. Degenerative changes in the left AC joint. SOFT TISSUES: No abnormal calcifications. Visualized lung is unremarkable. IMPRESSION: 1. No acute fracture or dislocation. Electronically signed by: Franky Crease MD 10/04/2024 09:10 PM EST RP Workstation: [id number]   CT Head Wo Contrast Result Date: 10/04/2024 EXAM: CT HEAD WITHOUT CONTRAST 10/04/2024 08:48:15 PM TECHNIQUE: CT of the head was performed without the administration of intravenous contrast. Automated exposure control, iterative reconstruction, and/or weight based adjustment of the mA/kV was utilized to reduce the radiation dose to as low as reasonably achievable. COMPARISON: CTA head dated 11/27/2023. CLINICAL HISTORY: Polytrauma, blunt. FINDINGS: BRAIN AND VENTRICLES: No acute hemorrhage. Severe and subacute changes related to prior large right MCA territory infarct with stable ex vacuo dilatation of the right lateral ventricle. No extra-axial collection. No mass effect or midline shift. Stable mineralization of the right frontal lobe. Subcortical and periventricular small vessel ischemic changes. ORBITS: No acute abnormality. SINUSES: No acute abnormality. SOFT TISSUES AND SKULL: No acute soft tissue abnormality. Postsurgical changes involving the right frontoparietal calvarium. IMPRESSION: 1. No acute intracranial abnormality. 2. Stable encephalomalacic changes related to large right MCA infarct. Electronically signed by: Pinkie Pebbles MD 10/04/2024 08:58 PM EST RP Workstation: [id number]   CT Cervical Spine Wo Contrast Result Date: 10/04/2024 EXAM: CT  CERVICAL SPINE WITHOUT CONTRAST 10/04/2024 08:48:15 PM TECHNIQUE: CT of the cervical spine was performed without the administration of intravenous contrast. Multiplanar reformatted images are provided for review. Automated exposure control, iterative reconstruction, and/or weight based adjustment of the mA/kV was utilized to reduce the radiation dose to as low as reasonably achievable. COMPARISON: None available. CLINICAL HISTORY: Polytrauma, blunt FINDINGS: BONES AND ALIGNMENT: No acute fracture or traumatic malalignment. DEGENERATIVE CHANGES: No significant degenerative changes. SOFT TISSUES: No prevertebral soft tissue swelling. IMPRESSION: 1. No acute abnormality of the cervical spine. Electronically signed by: Pinkie Pebbles MD 10/04/2024 08:54 PM EST RP Workstation: [id number]     Procedures   Medications Ordered in the ED  acetaminophen  (TYLENOL ) tablet 650 mg (650 mg Oral Given 10/04/24 2234)  Medical Decision Making Amount and/or Complexity of Data Reviewed Radiology: independent interpretation performed. Decision-making details documented in ED Course.   Presents after a fall earlier today.  Patient awake and alert, no current complaints.  Patient reports landing on his left side.  X-ray of left shoulder, left hip negative.  CT head and cervical spine without acute injury.  Patient appropriate for discharge.     Final diagnoses:  Fall, initial encounter    ED Discharge Orders     None          Beulah Matusek, Lonni PARAS, MD 10/05/24 0010

## 2024-10-05 NOTE — ED Notes (Signed)
 Patient refused discharge vitals.

## 2024-10-06 ENCOUNTER — Ambulatory Visit: Admitting: Physical Therapy

## 2024-10-06 DIAGNOSIS — R2681 Unsteadiness on feet: Secondary | ICD-10-CM

## 2024-10-06 DIAGNOSIS — M6281 Muscle weakness (generalized): Secondary | ICD-10-CM

## 2024-10-06 DIAGNOSIS — R2689 Other abnormalities of gait and mobility: Secondary | ICD-10-CM

## 2024-10-06 NOTE — Therapy (Signed)
 OUTPATIENT PHYSICAL THERAPY NEURO TREATMENT   Patient Name: Matthew Black MRN: [id number] DOB:1976/04/30, 48 y.o., male Today's Date: 10/07/2024   PCP: Dorina Loving, PA-C REFERRING PROVIDER: Lorilee Sven SQUIBB, MD  END OF SESSION:    PT End of Session - 10/07/24 0833     Visit Number 42    Number of Visits 59    Date for Recertification  11/16/24    Authorization Type [employer] auth #: [id number] for 8 PT vst from 11/10-12/8    Authorization - Visit Number 1    Authorization - Number of Visits 8    PT Start Time 1530    PT Stop Time 1612    PT Time Calculation (min) 42 min    Equipment Utilized During Treatment Gait belt;Other (comment)   L LE AFO   Activity Tolerance Patient tolerated treatment well    Behavior During Therapy [facility] for tasks assessed/performed             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   Stroke (HCC)    RICA, R A1, R MCA occlusion 07/19/19   Past Surgical History:  Procedure Laterality Date   CRANIOPLASTY Right 06/05/2020   Procedure: CRANIOPLASTY;  Surgeon: Lanis Pupa, MD;  Location: [facility] OR;  Service: Neurosurgery;  Laterality: Right;  right   CRANIOTOMY Right 07/19/2019   Procedure: RIGHT HEMI-CRANIECTOMY With implantation of skull flap to abdominal wall;  Surgeon: Lanis Pupa, MD;  [facility] OR;  Service: Neurosurgery;  Laterality: Right;   CYST EXCISION N/A 10/08/2016   Procedure: EXCISION OF POSTERIOR NECK CYST;  Surgeon: Mitzie DELENA Freund, MD;  Location: [facility] ORS;  Service: General;  Laterality: N/A;    CYSTOSCOPY/URETEROSCOPY/HOLMIUM LASER/STENT PLACEMENT Right 03/16/2020   Procedure: CYSTOSCOPY RIGHT RETROGRADE PYELOGRAM URETEROSCOPY/HOLMIUM LASER/STENT PLACEMENT;  Surgeon: Carolee Sherwood JONETTA DOUGLAS, MD;  Location: [facility] ORS;  Service: Urology;  Laterality: Right;   INCISION AND DRAINAGE ABSCESS N/A 09/22/2014   Procedure: INCISION AND DRAINAGE ABSCESS POSTERIOR NECK;  Surgeon: Donnice KATHEE Lunger, MD;  Location: [facility] ORS;  Service: General;  Laterality: N/A;   INCISION AND DRAINAGE ABSCESS N/A 12/20/2015   Procedure: INCISION AND DRAINAGE POSTERIOR NECK MASS;  Surgeon: Krystal Spinner, MD;  Location: [facility] ORS;  Service: General;  Laterality: N/A;   INCISION AND DRAINAGE ABSCESS Left 07/10/2004   middle finger   IR IVC FILTER PLMT / S&I /IMG GUID/MOD SED  07/21/2019   IR RADIOLOGIST EVAL & MGMT  12/14/2019   IR RADIOLOGIST EVAL & MGMT  12/21/2020   IR VENOGRAM RENAL UNI RIGHT  07/21/2019   MASS EXCISION N/A 07/21/2017   Procedure: EXCISION OF BENIGN NECK LESION WITH LAYERED CLOSURE;  Surgeon: Arelia Filippo, MD;  Location: [facility];  Service: Plastics;  Laterality: N/A;   MASS EXCISION N/A 11/10/2017   Procedure: EXCISION BENIGN LESION OF THE NECK WITH LAYERED CLOSURE;  Surgeon: Arelia Filippo, MD;  Location: [facility];  Service: Plastics;  Laterality: N/A;   Patient Active Problem List   Diagnosis Date Noted  Hx of adenomatous colonic polyps 06/01/2023   COVID-19 virus infection 04/21/2021   Left-sided weakness 04/21/2021   S/P craniotomy 06/05/2020   History of cranioplasty 06/05/2020   Peri-rectal abscess 02/14/2020   Abnormal CT scan, pelvis 02/14/2020   Pancytopenia (HCC) 12/02/2019   Nephrolithiasis 12/02/2019   Hydronephrosis with renal and ureteral calculus obstruction 12/02/2019   Rectal pain 12/02/2019   Hyperkalemia 12/02/2019   Reactive depression    Wound infection after surgery    Sleep disturbance    Dysphagia, post-stroke    Transaminitis    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   Cerebral abscess    Urinary tract infection without hematuria    Altered mental status    Primary hypercoagulable state    Acute pulmonary embolism without acute cor pulmonale (HCC)    Deep vein thrombosis (DVT) of non-extremity vein    Hypokalemia    Acute blood loss anemia    Leukocytosis    Endotracheal tube present    Acute respiratory failure with hypoxemia (HCC)    Stroke (cerebrum) (HCC) 07/19/2019   Pressure injury of skin 07/19/2019   Acute CVA (cerebrovascular accident) (HCC)    Encephalopathy    Dysphagia    Acute encephalopathy    Essential hypertension    Obesity 03/12/2016   Scalp abscess 12/20/2015   Neck abscess 12/20/2015   Pilonidal cyst 02/08/2013    ONSET DATE: stroke in August 2020  REFERRING DIAG: I63.9 (ICD-10-CM) - Cerebrovascular accident (CVA), unspecified mechanism (HCC)   THERAPY DIAG:    No diagnosis found.   Rationale for Evaluation and Treatment: Rehabilitation  SUBJECTIVE:                                                                                                                                                                                             SUBJECTIVE STATEMENT:   Pt reports he had a bad fall since lase weekend and is very nervous and down due to this, he almost cancelled but decided to come anyway. Pt encouraged that making decision to come to therapy was very beneficial and encouraged of this decision.    Of note: Botox  in L LE biceps femoris on 06/24/2024 Has the Motus Nova Foot device    Initial Eval: Patient reports his goal is to walk without using a cane and without assistance. Patient states he wants to walk like he did before having the stroke. Reports his stroke was in August of 2020. States he currently uses a quad cane for ambulation and tries to walk as much as possible. Reports he has a manual wheelchair, but states he  tries not to use it because it depresses  him and makes him feel like he is back to when he first had his stroke. Reports wearing L LE custom AFO at all times.   Reports he is currently doing everything mod-I, but states he could call his mom, dad, or brothers if he needed assistance.  Reports he has routine botox  injections in L UE to help manage tone, but doesn't receive any in his LE.  Reports he has a hospital bed, but sleeps in a regular bed.  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  Etiology of carotid occlusion unclear possibly dissection.  He had a prolonged hospital admission with several complications including DVT, pulmonary embolism, hemorrhagic transformation, abdominal wall hematoma, UTI but made quite remarkable recovery   PAIN:  Are you having pain? No, denies pain today, but states sometimes will have L shoulder pain  PRECAUTIONS: Fall  RED FLAGS: None   WEIGHT BEARING RESTRICTIONS: No  FALLS: Has patient fallen in last 6 months? Yes, stating he fell down his stairs ~5 months ago and is now scared to perform stair navigation  LIVING ENVIRONMENT: Lives with: lives alone Lives in: House/apartment Stairs: Yes: Internal: flight steps; can reach both and External: 4 steps; can reach both, but states he doesn't have to go upstairs Has following equipment at home: Quad cane small base, Hemi walker, Wheelchair (manual), shower chair, Shower bench, and L LE custom AFO Pt has custom K5 wheelchair with rigid, contoured back support and Roho cushion. Pt reports he calls [employer] to come adjust his  seat cushion when he feels it is getting uncomfortable.  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  DIAGNOSTIC FINDINGS:  EXAM: CT ANGIOGRAPHY HEAD AND NECK WITH AND WITHOUT CONTRAST IMPRESSION: 1. No acute intracranial abnormality. 2. Chronically occluded right ICA with minimal opacification of the right MCA and ACA branches. 3. Redemonstrated extensive encephalomalacia in the right MCA and ACA territories secondary to a prior infarct.  Electronically Signed   By: Lyndall Gore M.D.   On: 11/27/2023 13:45  COGNITION: Overall cognitive status: Within functional limits for tasks assessed  VISION (03/29/2024): Therapist performed visual field screen with pt appearing to have L homonomous hemianopsia - pt confirms he has loss of vision on L side.   SENSATION: Light touch: Impaired in L LE Proprioception: Impaired  in L LE  Can feel deep pressure in L LE   COORDINATION: Impaired in L LE due to paresis, hypertonia, and overall decreased flexibility  EDEMA:  Not formally assessed  MUSCLE TONE: LLE: Mild, Moderate, and Hypertonic  MUSCLE LENGTH: Not formally assessed; however, demonstrates L hamstring muscle tightness  DTRs:  Not formally assessed  POSTURE: rounded shoulders, forward head, and weight shift right  LOWER EXTREMITY ROM:       Right Eval Active ROM Left Eval Passive ROM  Hip flexion WFL Achieves at least 90  Hip extension    Hip abduction    Hip adduction    Hip internal rotation  Unable to achieve neutral  hip rotation, lacking internal rotation  Hip external rotation  [facility] and rests in excessive ER  Knee flexion [facility]  Knee extension WFL Able to achieve terminal knee extension in supine, but with significant discomfort due to hamstring tone  Ankle dorsiflexion WFL Did not remove AFO to assess this date  Ankle plantarflexion WFL Did not remove AFO to assess this date  Ankle inversion    Ankle eversion     (Blank rows = not tested)  LOWER EXTREMITY MMT:    MMT Right Eval Left Eval  Hip flexion 4+ 2-  (compensates with hip adductor activation)  Hip extension    Hip abduction    Hip adduction  3+  Hip internal rotation  2-  Hip external rotation  2  Knee flexion 4+ 1  Knee extension 4+ 3+  Ankle dorsiflexion 4+ Did not remove AFO to assess this date  Ankle plantarflexion 4+ Did not remove AFO to assess this date  Ankle inversion    Ankle eversion    (Blank rows = not tested)  Manual Muscle Test Scale 0/5 = No muscle contraction can be seen or felt 1/5 = Contraction can be felt, but there is no motion 2-/5 = Part moves through incomplete ROM w/ gravity decreased 2/5 = Part moves through complete ROM w/ gravity decreased 2+/5 = Part moves through incomplete ROM (<50%) against gravity or through complete ROM w/ gravity 3-/5 = Part moves through incomplete ROM (>50%) against gravity 3/5 = Part moves through complete ROM against gravity 3+/5 = Part moves through complete ROM against gravity/slight resistance 4-/5= Holds test position against slight to moderate pressure 4/5 = Part moves through complete ROM against gravity/moderate resistance 4+/5= Holds test position against moderate to strong pressure 5/5 = Part moves through complete ROM against gravity/full resistance  BED MOBILITY: (sleeps in regular bed, but has hospital bed available) Findings: Sit to supine CGA Supine to sit Min A and Mod A Rolling to Right Mod A Rolling to Left Min A and Mod A  TRANSFERS: Sit to  stand: CGA and Min A  Assistive device utilized: Counselling psychologist     Stand to sit: CGA and Min A  Assistive device utilized: Tree Surgeon to chair: Min A  Assistive device utilized: Counselling psychologist       RAMP:  Not tested  CURB:  Not tested  STAIRS: Not tested *need to assess*  GAIT: Findings: advances L LE with excessive hip external rotation using hip adductors, step to pattern, decreased arm swing- Left, decreased step length- Right, decreased stance time- Left, decreased stride length, decreased hip/knee flexion- Left, decreased ankle dorsiflexion- Left, lateral lean- Right, and decreased trunk rotation Distance walked: ~78ft Assistive device utilized: small based quad cane in R UE  Level of assistance: CGA and Min A  FUNCTIONAL TESTS:  5 times sit to stand: need to assess Timed up and go (TUG): need to assess 6 minute walk test: need to assess 10 meter walk test: 0.42m/s using small based quad cane and CGA Berg Balance Scale: need to assess  PATIENT SURVEYS:  Stroke Impact Scale 03/15/2024: 49/80  TREATMENT DATE: 10/07/2024   Gait into therapy clinic using SBQC mod-I with only slight step-through pattern leading with L LE.   Donned litegait harness with R UE support on quad cane . Of note, pt requiring additional seated rest break with donning today.   Got onto lite gait using lite gait handle and got toe onto treadmill and propelled self up.   Gait training the following trials on treadmill in litegait harness, not providing BWS, only used for balance: *Pt using R UE on treadmill throughout* 4 rounds of 2 min at speed of .8-1 MPH - total distance  Adjusted R arm support to be wider and lower and pt UE comfort improved significantly  Total distance 594 ft across all trials   Stepped off treadmill in litegait harness  for balance safety and therapist providing min manual facilitation for L LE management.   PATIENT EDUCATION: Education details: Therapy POC, LTGs, HEP, importance of improving L LE flexibility, findings during assessment  Person educated: Patient Education method: Explanation and Handouts Education comprehension: verbalized understanding and needs further education  HOME EXERCISE PROGRAM:  Access Code: [email or web address] URL: [email or web address] Date: 03/10/2024 Prepared by: Connell Kiss  Exercises - Seated Hip Adduction Isometrics with Ball  - 1 x daily - 7 x weekly - 2 sets - 10 reps - 5 seconds hold - Supine Quadricep Sets  - 1 x daily - 7 x weekly - 2 sets - 10 reps - 3 seconds hold Use of Motus Nova Foot device for at least 1hour every day  GOALS: Goals reviewed with patient? Yes  SHORT TERM GOALS: Target date: 07/13/2024  Patient will be independent in home exercise program to improve strength/mobility for better functional independence with ADLs.  Baseline: initiated on 03/10/2024 04/14/2024: continued Goal status: IN PROGRESS   LONG TERM GOALS: Target date: 11/16/2024  Patient (< 80 years old) will complete five times sit to stand (5XSTS) test in < 10 seconds indicating an increased LE strength and improved balance.  Baseline: 03/15/2024: 16.53 seconds from standard green chair relying on R hand to push-up from armrest and pt's personal SBQC nearby, but not using it VERSUS 25.85 seconds with R hand across chest, with min A for balance/safety without use of UE support 04/14/2024: 8.96 seconds using R UE support to push-up from armrest and pt's SBQC next to him, 13.75 seconds with R hand across chest with min A for therapist to stabilize chair behind him 05/30/24: 13.01 sec with R UE support on chair; 15.26 sec with R hand across chest and minA from therapist to stabilize chair behind pt 08/10/2024: 12.56 seconds using R UE support on chair armrest & 14.20 seconds with R UE  across chest & chair against wall 09/20/24: 23.01 with no UE assistance and one episode of LOB; 18.86 with UE support on SBQC Goal status: IN PROGRESS  2.  Patient will increase Berg Balance score to > 45/56 to demonstrate improved balance and decreased fall risk during functional activities and ADLs.  Baseline: 03/15/2024: 14/56 04/14/2024: 22/56 05/30/24: TBD at next visit 07/27/2024: 33/56 Goal status: IN PROGRESS  3.  Patient will increase 10 meter walk test to >1.47m/s using LRAD as to improve gait speed for better community ambulation and to reduce fall risk.  Baseline: 0.30 m/s using small based quad cane with CGA and 1x light min A due to balance instability  04/14/2024: 0.405 m/s using SBQC with close SBA for safety 05/30/24: 0.379 m/s using SBQC with close SBA for safety.  07/21/2024= 0.43 m/s with SBQC with close SBA 09/20/24: 0.40 m/s with SBQC and close SBA, 25.10 sec Goal status: IN PROGRESS  4.  Patient will increase six minute walk test distance to >536ft for progression towards community ambulator and improve gait ability  Baseline: 210ft with [facility]  04/20/2024:  235ft with SBQC and SBA 05/30/24: 360 ft with [facility] 08/10/2024: 416 feet (126.80 meters, Avg speed 0.12m/s) using SBQC mod-I. 09/20/24: 282' in 3 min 27 sec before needing to sit and terminate the test  Goal status: IN PROGRESS  5.  Patient will reduce timed up and go to <15 seconds to reduce fall risk and demonstrate improved transfer/gait ability. Baseline: 03/15/2024: 25.32 seconds using SBQC and chair with armrest as well as CGA for safety/steadying 04/14/2024: 22.25 seconds using SBQC & chair w/ armrest and close SBA for safety 05/30/24: 26.87 seconds w/ SBQC and use; 07/21/2024= 21.13 sec with SBQC and SBA 07/21/2024:  21.13 sec with SBQC and SBA  09/20/24: 24.7 sec with SBQC and SBA Goal status: IN PROGRESS  6.  Patient will ascend/descend 4 stairs, using railing, independently without loss of balance to improve ability  to get in/out of home.   Baseline: 03/31/2024: using R UE support on each HR with skilled min assist for balance using primarily step-to pattern 04/20/2024:  using R UE support on each HR with skilled CGA for safety using step-to pattern 05/30/24: continued use of R UE support on HR with skilled CGA for safety using step-to pattern 08/10/2024: R UE support on HR, step-to pattern, supervision only Goal status: IN PROGRESS  ASSESSMENT:  CLINICAL IMPRESSION:    Continued to note fatigue today, but not limiting participation with higher intensity gait training on treadmill with use of litegait harness for balance support and safety. Pt having some decreased confidence initially due to recent fall but feeling better after treatment commenced and was encouraged by progress at end of session.  Patient remains highly motivated to participate in therapy and is eager to continue participating in HIGT on the treadmill, which is an intervention that has been shown to have significant impact on gait speed and endurance. Continue with POC as described pending additional insurance approval to achieve LTGs.  Pt will continue to benefit from skilled therapy to address remaining deficits in order to improve overall QoL and return to PLOF.     OBJECTIVE IMPAIRMENTS: Abnormal gait, cardiopulmonary status limiting activity, decreased activity tolerance, decreased balance, decreased coordination, decreased endurance, decreased knowledge of use of DME, decreased mobility, difficulty walking, decreased ROM, decreased strength, hypomobility, impaired flexibility, impaired sensation, impaired tone, impaired UE functional use, improper body mechanics, postural dysfunction, and pain.   ACTIVITY LIMITATIONS: carrying, lifting, bending, standing, squatting, sleeping, stairs, transfers, bed mobility, continence, bathing, toileting, dressing, reach over head, hygiene/grooming, locomotion level, and caring for others  PARTICIPATION  LIMITATIONS: meal prep, cleaning, laundry, medication management, shopping, community activity, and yard work  PERSONAL FACTORS: Age, Past/current experiences, Time since onset of injury/illness/exacerbation, Transportation, and 3+ comorbidities: HTN, hx of kidney stones, depression are also affecting patient's functional outcome.   REHAB POTENTIAL: Good  CLINICAL DECISION MAKING: Evolving/moderate complexity  EVALUATION COMPLEXITY: Moderate  PLAN:  PT FREQUENCY: 1-2x/week  PT DURATION: 12 weeks  PLANNED INTERVENTIONS: [id number]- PT Re-evaluation, 97750- Physical Performance Testing, 97110-Therapeutic exercises, 97530- Therapeutic activity, [id number]- Neuromuscular re-education, 97535- Self Care, [id number]- Manual therapy, [id number]- Gait training, [id number]- Orthotic/Prosthetic subsequent, [phone]- Canalith repositioning, [id number]- Electrical stimulation (manual), Patient/Family education, Balance training, Stair training, Taping, Dry Needling, Joint  mobilization, Joint manipulation, Vestibular training, Visual/preceptual remediation/compensation, DME instructions, Cryotherapy, Moist heat, and Biofeedback  PLAN FOR NEXT SESSION:   *HIGT on treadmill* Continue +2 A for L wt shift & increase harness support - goal to decrease reliance on R UE support Increasing speed and gradually work towards adding L LE AW or variable direction walking or no R UE support  Progress HEP  high intensity gait training using +2 HHA vs litegait support vs SBQC continue adding 7lb AW to L LE when appropriate vs decreasing RUE support/reliance while in litegait Backwards gait training - continue Stepping over obstacles - continue Continue stair training for L LE NMR For stance   R foot taps while sustaining L hip extension For swing phase Reciprocal foot taps L LE stretching and weightbearing for tone management and to improve alignment during gait/standing (avoid excessive hip ER) Non gait training ideas: bridges with R foot  placed on unstable surface to promote increased L LE wt bearing    Note: Portions of this document were prepared using Dragon voice recognition software and although reviewed may contain unintentional dictation errors in syntax, grammar, or spelling.  Lonni KATHEE Gainer PT ,DPT Physical Therapist- [facility]    10/07/24, 8:34 AM

## 2024-10-10 ENCOUNTER — Telehealth: Payer: Self-pay

## 2024-10-10 ENCOUNTER — Telehealth: Payer: Self-pay | Admitting: Internal Medicine

## 2024-10-10 NOTE — Telephone Encounter (Signed)
 error

## 2024-10-10 NOTE — Telephone Encounter (Signed)
 Noted pt scheduled with Dr. Vita 12/15  Copied from CRM #[id number]. Topic: Appointments - Transfer of Care >> Oct 10, 2024  1:59 PM Rea C wrote: Pt is requesting to transfer FROM: Matthew Black, Matthew Black Pt is requesting to transfer TO: Matthew Morrow MD Reason for requested transfer: [facility] It is the responsibility of the team the patient would like to transfer to (Dr. Vita) to reach out to the patient if for any reason this transfer is not acceptable.

## 2024-10-10 NOTE — Telephone Encounter (Signed)
 Copied from CRM #[id number]. Topic: Appointments - Transfer of Care >> Oct 10, 2024  1:59 PM Rea C wrote: Pt is requesting to transfer FROM: DORINA, EDWARD Pt is requesting to transfer TO: Vita Morrow MD Reason for requested transfer: [facility] It is the responsibility of the team the patient would like to transfer to (Dr. Vita) to reach out to the patient if for any reason this transfer is not acceptable.

## 2024-10-11 ENCOUNTER — Ambulatory Visit

## 2024-10-11 DIAGNOSIS — I69354 Hemiplegia and hemiparesis following cerebral infarction affecting left non-dominant side: Secondary | ICD-10-CM

## 2024-10-11 DIAGNOSIS — R29818 Other symptoms and signs involving the nervous system: Secondary | ICD-10-CM

## 2024-10-11 DIAGNOSIS — R278 Other lack of coordination: Secondary | ICD-10-CM

## 2024-10-11 DIAGNOSIS — R2681 Unsteadiness on feet: Secondary | ICD-10-CM

## 2024-10-11 DIAGNOSIS — M6281 Muscle weakness (generalized): Secondary | ICD-10-CM

## 2024-10-11 DIAGNOSIS — R2689 Other abnormalities of gait and mobility: Secondary | ICD-10-CM

## 2024-10-11 DIAGNOSIS — R29898 Other symptoms and signs involving the musculoskeletal system: Secondary | ICD-10-CM

## 2024-10-12 ENCOUNTER — Ambulatory Visit

## 2024-10-12 NOTE — Therapy (Signed)
 OUTPATIENT PHYSICAL THERAPY NEURO TREATMENT   Patient Name: Matthew Black MRN: [id number] DOB:Aug 07, 1976, 48 y.o., male Today's Date: 10/12/2024   PCP: Dorina Loving, PA-C REFERRING PROVIDER: Lorilee Sven SQUIBB, MD  END OF SESSION:    PT End of Session - 10/11/24 0806     Visit Number 43    Number of Visits 59    Date for Recertification  11/16/24    Authorization Type [employer] auth #: [id number] for 8 PT vst from 11/10-12/8    Authorization - Visit Number 2    Authorization - Number of Visits 8    Progress Note Due on Visit 50    PT Start Time 1449    PT Stop Time 1530    PT Time Calculation (min) 41 min    Equipment Utilized During Treatment Gait belt;Other (comment)   L LE AFO   Activity Tolerance Patient tolerated treatment well    Behavior During Therapy [facility] for tasks assessed/performed             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   Stroke (HCC)    RICA, R A1, R MCA occlusion 07/19/19   Past Surgical History:  Procedure Laterality Date   CRANIOPLASTY Right 06/05/2020   Procedure: CRANIOPLASTY;  Surgeon: Lanis Pupa, MD;  Location: [facility] OR;  Service: Neurosurgery;  Laterality: Right;  right   CRANIOTOMY Right 07/19/2019   Procedure: RIGHT HEMI-CRANIECTOMY With implantation of skull flap to abdominal wall;  Surgeon: Lanis Pupa, MD;  Location: [facility] OR;  Service: Neurosurgery;  Laterality: Right;   CYST EXCISION N/A 10/08/2016   Procedure: EXCISION OF POSTERIOR NECK CYST;  Surgeon: Mitzie DELENA Freund, MD;  Location: [facility] ORS;  Service: General;   Laterality: N/A;   CYSTOSCOPY/URETEROSCOPY/HOLMIUM LASER/STENT PLACEMENT Right 03/16/2020   Procedure: CYSTOSCOPY RIGHT RETROGRADE PYELOGRAM URETEROSCOPY/HOLMIUM LASER/STENT PLACEMENT;  Surgeon: Carolee Sherwood JONETTA DOUGLAS, MD;  Location: [facility] ORS;  Service: Urology;  Laterality: Right;   INCISION AND DRAINAGE ABSCESS N/A 09/22/2014   Procedure: INCISION AND DRAINAGE ABSCESS POSTERIOR NECK;  Surgeon: Donnice KATHEE Lunger, MD;  Location: [facility];  Service: General;  Laterality: N/A;   INCISION AND DRAINAGE ABSCESS N/A 12/20/2015   Procedure: INCISION AND DRAINAGE POSTERIOR NECK MASS;  Surgeon: Krystal Spinner, MD;  Location: [facility];  Service: General;  Laterality: N/A;   INCISION AND DRAINAGE ABSCESS Left 07/10/2004   middle finger   IR IVC FILTER PLMT / S&I /IMG GUID/MOD SED  07/21/2019   IR RADIOLOGIST EVAL & MGMT  12/14/2019   IR RADIOLOGIST EVAL & MGMT  12/21/2020   IR VENOGRAM RENAL UNI RIGHT  07/21/2019   MASS EXCISION N/A 07/21/2017   Procedure: EXCISION OF BENIGN NECK LESION WITH LAYERED CLOSURE;  Surgeon: Arelia Filippo, MD;  Location: [facility];  Service: Plastics;  Laterality: N/A;   MASS EXCISION N/A 11/10/2017   Procedure: EXCISION BENIGN LESION OF THE NECK WITH LAYERED CLOSURE;  Surgeon: Arelia Filippo, MD;  Location: [facility];  Service: Plastics;  Laterality: N/A;  Patient Active Problem List   Diagnosis Date Noted   Hx of adenomatous colonic polyps 06/01/2023   COVID-19 virus infection 04/21/2021   Left-sided weakness 04/21/2021   S/P craniotomy 06/05/2020   History of cranioplasty 06/05/2020   Peri-rectal abscess 02/14/2020   Abnormal CT scan, pelvis 02/14/2020   Pancytopenia (HCC) 12/02/2019   Nephrolithiasis 12/02/2019   Hydronephrosis with renal and ureteral calculus obstruction 12/02/2019   Rectal pain 12/02/2019   Hyperkalemia 12/02/2019   Reactive depression    Wound infection after surgery    Sleep disturbance    Dysphagia, post-stroke     Transaminitis    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   Cerebral abscess    Urinary tract infection without hematuria    Altered mental status    Primary hypercoagulable state    Acute pulmonary embolism without acute cor pulmonale (HCC)    Deep vein thrombosis (DVT) of non-extremity vein    Hypokalemia    Acute blood loss anemia    Leukocytosis    Endotracheal tube present    Acute respiratory failure with hypoxemia (HCC)    Stroke (cerebrum) (HCC) 07/19/2019   Pressure injury of skin 07/19/2019   Acute CVA (cerebrovascular accident) (HCC)    Encephalopathy    Dysphagia    Acute encephalopathy    Essential hypertension    Obesity 03/12/2016   Scalp abscess 12/20/2015   Neck abscess 12/20/2015   Pilonidal cyst 02/08/2013    ONSET DATE: stroke in August 2020  REFERRING DIAG: I63.9 (ICD-10-CM) - Cerebrovascular accident (CVA), unspecified mechanism (HCC)   THERAPY DIAG:    Muscle weakness (generalized)  Unsteadiness on feet  Other abnormalities of gait and mobility  Hemiplegia and hemiparesis following cerebral infarction affecting left non-dominant side (HCC)  Other lack of coordination  Other symptoms and signs involving the musculoskeletal system  Other symptoms and signs involving the nervous system  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)   Rationale for Evaluation and Treatment: Rehabilitation  SUBJECTIVE:                                                                                                                                                                                             SUBJECTIVE STATEMENT:   Pt reports doing okay with no further falls.    Of note: Botox  in L LE biceps femoris on 06/24/2024 Has the Motus Nova Foot device    Initial Eval: Patient reports his goal is to walk without using a cane and without assistance. Patient states he wants to walk like he did before  having  the stroke. Reports his stroke was in August of 2020. States he currently uses a quad cane for ambulation and tries to walk as much as possible. Reports he has a manual wheelchair, but states he tries not to use it because it depresses him and makes him feel like he is back to when he first had his stroke. Reports wearing L LE custom AFO at all times.   Reports he is currently doing everything mod-I, but states he could call his mom, dad, or brothers if he needed assistance.  Reports he has routine botox  injections in L UE to help manage tone, but doesn't receive any in his LE.  Reports he has a hospital bed, but sleeps in a regular bed.  Pt accompanied by: self  PERTINENT HISTORY: L hemiparesis s/p R ACA and MCA territory CVA in August 2020, HTN, hx of kidney stones, depression  Per Neurology MD note on 11/24/2023: 55-year-old African-American male with large right hemispheric infarct due to right internal carotid and middle cerebral artery occlusion with cytotoxic edema and brain herniation s/p hemicraniectomy in 06/2019 is doing reasonably well with residual left spastic hemiparesis and left hemianopia.  Etiology of carotid occlusion unclear possibly dissection.  He had a prolonged hospital admission with several complications including DVT, pulmonary embolism, hemorrhagic transformation, abdominal wall hematoma, UTI but made quite remarkable recovery   PAIN:  Are you having pain? No, denies pain today, but states sometimes will have L shoulder pain  PRECAUTIONS: Fall  RED FLAGS: None   WEIGHT BEARING RESTRICTIONS: No  FALLS: Has patient fallen in last 6 months? Yes, stating he fell down his stairs ~5 months ago and is now scared to perform stair navigation  LIVING ENVIRONMENT: Lives with: lives alone Lives in: House/apartment Stairs: Yes: Internal: flight steps; can reach both and External: 4 steps; can reach both, but states he doesn't have to go upstairs Has following  equipment at home: Quad cane small base, Hemi walker, Wheelchair (manual), shower chair, Shower bench, and L LE custom AFO Pt has custom K5 wheelchair with rigid, contoured back support and Roho cushion. Pt reports he calls [employer] to come adjust his seat cushion when he feels it is getting uncomfortable.  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  DIAGNOSTIC FINDINGS:  EXAM: CT ANGIOGRAPHY HEAD AND NECK WITH AND WITHOUT CONTRAST IMPRESSION: 1. No acute intracranial abnormality. 2. Chronically occluded right ICA with minimal opacification of the right MCA and ACA branches. 3. Redemonstrated extensive encephalomalacia in the right MCA and ACA territories secondary to a prior infarct.  Electronically Signed   By: Lyndall Gore M.D.   On: 11/27/2023 13:45  COGNITION: Overall cognitive status: Within functional limits for tasks assessed  VISION (03/29/2024): Therapist performed visual field screen with pt appearing to have L homonomous hemianopsia - pt confirms he has loss of vision on L side.   SENSATION: Light touch: Impaired in L LE Proprioception: Impaired  in L LE  Can feel deep pressure in L LE   COORDINATION: Impaired in L LE due to paresis, hypertonia, and overall decreased flexibility  EDEMA:  Not formally assessed  MUSCLE  TONE: LLE: Mild, Moderate, and Hypertonic  MUSCLE LENGTH: Not formally assessed; however, demonstrates L hamstring muscle tightness  DTRs:  Not formally assessed  POSTURE: rounded shoulders, forward  head, and weight shift right  LOWER EXTREMITY ROM:       Right Eval Active ROM Left Eval Passive ROM  Hip flexion WFL Achieves at least 90  Hip extension    Hip abduction    Hip adduction    Hip internal rotation  Unable to achieve neutral hip rotation, lacking internal rotation  Hip external rotation  [facility] and rests in excessive ER  Knee flexion [facility]  Knee extension WFL Able to achieve terminal knee extension in supine, but with significant discomfort due to hamstring tone  Ankle dorsiflexion WFL Did not remove AFO to assess this date  Ankle plantarflexion WFL Did not remove AFO to assess this date  Ankle inversion    Ankle eversion     (Blank rows = not tested)  LOWER EXTREMITY MMT:    MMT Right Eval Left Eval  Hip flexion 4+ 2-  (compensates with hip adductor activation)  Hip extension    Hip abduction    Hip adduction  3+  Hip internal rotation  2-  Hip external rotation  2  Knee flexion 4+ 1  Knee extension 4+ 3+  Ankle dorsiflexion 4+ Did not remove AFO to assess this date  Ankle plantarflexion 4+ Did not remove AFO to assess this date  Ankle inversion    Ankle eversion    (Blank rows = not tested)  Manual Muscle Test Scale 0/5 = No muscle contraction can be seen or felt 1/5 = Contraction can be felt, but there is no motion 2-/5 = Part moves through incomplete ROM w/ gravity decreased 2/5 = Part moves through complete ROM w/ gravity decreased 2+/5 = Part moves through incomplete ROM (<50%) against gravity or through complete ROM w/ gravity 3-/5 = Part moves through incomplete ROM (>50%) against gravity 3/5 = Part moves through complete ROM against gravity 3+/5 = Part moves through complete ROM against gravity/slight resistance 4-/5= Holds test position against slight to moderate pressure 4/5 = Part moves through complete ROM against gravity/moderate resistance 4+/5= Holds test position against moderate to strong pressure 5/5 = Part moves through complete  ROM against gravity/full resistance  BED MOBILITY: (sleeps in regular bed, but has hospital bed available) Findings: Sit to supine CGA Supine to sit Min A and Mod A Rolling to Right Mod A Rolling to Left Min A and Mod A  TRANSFERS: Sit to stand: CGA and Min A  Assistive device utilized: Counselling psychologist     Stand to sit: CGA and Min A  Assistive device utilized: Tree Surgeon to chair: Min A  Assistive device utilized: Counselling psychologist       RAMP:  Not tested  CURB:  Not tested  STAIRS: Not tested *need to assess*  GAIT: Findings: advances L LE with excessive hip external rotation using hip adductors, step to pattern, decreased arm swing- Left, decreased step length- Right, decreased stance time- Left, decreased stride length, decreased hip/knee flexion- Left, decreased ankle dorsiflexion- Left, lateral lean- Right, and decreased trunk rotation Distance walked: ~47ft Assistive device utilized: small based quad cane in R UE  Level of assistance: CGA and Min A  FUNCTIONAL TESTS:  5 times sit to stand: need to assess Timed up and go (TUG): need to assess 6 minute  walk test: need to assess 10 meter walk test: 0.75m/s using small based quad cane and CGA Berg Balance Scale: need to assess  PATIENT SURVEYS:  Stroke Impact Scale 03/15/2024: 49/80                                                                                                                               TREATMENT DATE: 10/12/2024   Gait into therapy clinic using [facility] mod-I with only slight step-through pattern leading with L LE- Ambulated from waiting room to bathroom then from bathroom into clinic - approx 120 feet   Donned litegait harness with R UE support on quad cane . Of note, pt requiring additional seated rest break with donning again today- He reported the bottom straps felt too tight and restrictive- Loosen slightly but explained that the bottom needs to be tight around the  Greater trochanter of femur to ensure good hold during unweighing.   Step up onto lite gait using lite gait handle with PT assisting pushing lite gait over TM for proper alignment   Gait training:  Litegait - using slight BWS for several trials on treadmill in litegait harness: *Pt using R UE on treadmill handle positioned slightly out for comfort throughout trials* 4 rounds of  around 2 min at speed of .8-1 MPH - total distance  Total distance 512 ft across all trials   Stepped off treadmill in litegait harness for balance safety and therapist providing min manual facilitation for L LE management.   PATIENT EDUCATION: Education details: Therapy POC, LTGs, HEP, importance of improving L LE flexibility, findings during assessment  Person educated: Patient Education method: Explanation and Handouts Education comprehension: verbalized understanding and needs further education  HOME EXERCISE PROGRAM:  Access Code: [email or web address] URL: [email or web address] Date: 03/10/2024 Prepared by: Connell Kiss  Exercises - Seated Hip Adduction Isometrics with Ball  - 1 x daily - 7 x weekly - 2 sets - 10 reps - 5 seconds hold - Supine Quadricep Sets  - 1 x daily - 7 x weekly - 2 sets - 10 reps - 3 seconds hold Use of Motus Nova Foot device for at least 1hour every day  GOALS: Goals reviewed with patient? Yes  SHORT TERM GOALS: Target date: 07/13/2024  Patient will be independent in home exercise program to improve strength/mobility for better functional independence with ADLs.  Baseline: initiated on 03/10/2024 04/14/2024: continued Goal status: IN PROGRESS   LONG TERM GOALS: Target date: 11/16/2024  Patient (< 72 years old) will complete five times sit to stand (5XSTS) test in < 10 seconds indicating an increased LE strength and improved balance.  Baseline: 03/15/2024: 16.53 seconds from standard green chair relying on R hand to push-up from armrest and pt's personal SBQC nearby, but  not using it VERSUS 25.85 seconds with R hand across chest, with min A for balance/safety without use of UE support 04/14/2024: 8.96 seconds using R UE support to push-up from armrest  and pt's SBQC next to him, 13.75 seconds with R hand across chest with min A for therapist to stabilize chair behind him 05/30/24: 13.01 sec with R UE support on chair; 15.26 sec with R hand across chest and minA from therapist to stabilize chair behind pt 08/10/2024: 12.56 seconds using R UE support on chair armrest & 14.20 seconds with R UE across chest & chair against wall 09/20/24: 23.01 with no UE assistance and one episode of LOB; 18.86 with UE support on SBQC Goal status: IN PROGRESS  2.  Patient will increase Berg Balance score to > 45/56 to demonstrate improved balance and decreased fall risk during functional activities and ADLs.  Baseline: 03/15/2024: 14/56 04/14/2024: 22/56 05/30/24: TBD at next visit 07/27/2024: 33/56 Goal status: IN PROGRESS  3.  Patient will increase 10 meter walk test to >1.80m/s using LRAD as to improve gait speed for better community ambulation and to reduce fall risk.  Baseline: 0.30 m/s using small based quad cane with CGA and 1x light min A due to balance instability  04/14/2024: 0.405 m/s using SBQC with close SBA for safety 05/30/24: 0.379 m/s using SBQC with close SBA for safety.  07/21/2024= 0.43 m/s with SBQC with close SBA 09/20/24: 0.40 m/s with SBQC and close SBA, 25.10 sec Goal status: IN PROGRESS  4.  Patient will increase six minute walk test distance to >515ft for progression towards community ambulator and improve gait ability  Baseline: 229ft with [facility]  04/20/2024:  243ft with SBQC and SBA 05/30/24: 360 ft with [facility] 08/10/2024: 416 feet (126.80 meters, Avg speed 0.59m/s) using SBQC mod-I. 09/20/24: 282' in 3 min 27 sec before needing to sit and terminate the test  Goal status: IN PROGRESS  5.  Patient will reduce timed up and go to <15 seconds to reduce fall risk and  demonstrate improved transfer/gait ability. Baseline: 03/15/2024: 25.32 seconds using SBQC and chair with armrest as well as CGA for safety/steadying 04/14/2024: 22.25 seconds using SBQC & chair w/ armrest and close SBA for safety 05/30/24: 26.87 seconds w/ SBQC and use; 07/21/2024= 21.13 sec with SBQC and SBA 07/21/2024:  21.13 sec with SBQC and SBA  09/20/24: 24.7 sec with SBQC and SBA Goal status: IN PROGRESS  6.  Patient will ascend/descend 4 stairs, using railing, independently without loss of balance to improve ability to get in/out of home.   Baseline: 03/31/2024: using R UE support on each HR with skilled min assist for balance using primarily step-to pattern 04/20/2024:  using R UE support on each HR with skilled CGA for safety using step-to pattern 05/30/24: continued use of R UE support on HR with skilled CGA for safety using step-to pattern 08/10/2024: R UE support on HR, step-to pattern, supervision only Goal status: IN PROGRESS  ASSESSMENT:  CLINICAL IMPRESSION:    Treatment continued to focus on gait training using Litegait for High intensity gait training on TM to focus on reciprocal steps. Patient intially struggle with timing yet able to progress throughout session-slight decrease in overall distance but did progress with time today. His confidence was limited working with different PT but became more comfortable during session. He was ultimately limited by fatigue. Will continue participating in HIGT on the treadmill, which is an intervention that has been shown to have significant impact on gait speed and endurance. Continue with POC as described pending additional insurance approval to achieve LTGs.  Pt will continue to benefit from skilled therapy to address remaining deficits in order to improve overall  QoL and return to PLOF.     OBJECTIVE IMPAIRMENTS: Abnormal gait, cardiopulmonary status limiting activity, decreased activity tolerance, decreased balance, decreased coordination,  decreased endurance, decreased knowledge of use of DME, decreased mobility, difficulty walking, decreased ROM, decreased strength, hypomobility, impaired flexibility, impaired sensation, impaired tone, impaired UE functional use, improper body mechanics, postural dysfunction, and pain.   ACTIVITY LIMITATIONS: carrying, lifting, bending, standing, squatting, sleeping, stairs, transfers, bed mobility, continence, bathing, toileting, dressing, reach over head, hygiene/grooming, locomotion level, and caring for others  PARTICIPATION LIMITATIONS: meal prep, cleaning, laundry, medication management, shopping, community activity, and yard work  PERSONAL FACTORS: Age, Past/current experiences, Time since onset of injury/illness/exacerbation, Transportation, and 3+ comorbidities: HTN, hx of kidney stones, depression are also affecting patient's functional outcome.   REHAB POTENTIAL: Good  CLINICAL DECISION MAKING: Evolving/moderate complexity  EVALUATION COMPLEXITY: Moderate  PLAN:  PT FREQUENCY: 1-2x/week  PT DURATION: 12 weeks  PLANNED INTERVENTIONS: [id number]- PT Re-evaluation, 97750- Physical Performance Testing, 97110-Therapeutic exercises, 97530- Therapeutic activity, [id number]- Neuromuscular re-education, 97535- Self Care, [id number]- Manual therapy, [id number]- Gait training, [phone]- Orthotic/Prosthetic subsequent, [phone]- Canalith repositioning, [phone]- Electrical stimulation (manual), Patient/Family education, Balance training, Stair training, Taping, Dry Needling, Joint mobilization, Joint manipulation, Vestibular training, Visual/preceptual remediation/compensation, DME instructions, Cryotherapy, Moist heat, and Biofeedback  PLAN FOR NEXT SESSION:   *HIGT on treadmill* Continue +2 A for L wt shift & increase harness support - goal to decrease reliance on R UE support Increasing speed and gradually work towards adding L LE AW or variable direction walking or no R UE support  Progress HEP  high intensity  gait training using +2 HHA vs litegait support vs SBQC continue adding 7lb AW to L LE when appropriate vs decreasing RUE support/reliance while in litegait Backwards gait training - continue Stepping over obstacles - continue Continue stair training for L LE NMR For stance   R foot taps while sustaining L hip extension For swing phase Reciprocal foot taps L LE stretching and weightbearing for tone management and to improve alignment during gait/standing (avoid excessive hip ER) Non gait training ideas: bridges with R foot placed on unstable surface to promote increased L LE wt bearing      Reyes LOISE London PT  Physical Therapist- [facility]  [facility]    10/12/24, 11:39 AM

## 2024-10-17 ENCOUNTER — Telehealth: Payer: Self-pay

## 2024-10-17 ENCOUNTER — Ambulatory Visit

## 2024-10-17 NOTE — Telephone Encounter (Signed)
 Patient Name: Matthew Black MRN: [id number] DOB:Aug 09, 1976, 48 y.o., male Today's Date: 10/17/2024  Pt contacted via telephone and author left voice mail informing of missed appointment and informed pt of future PT appointment date and time.     Reyes LOISE London, PT 10/17/2024, 4:51 PM

## 2024-10-17 NOTE — Therapy (Incomplete)
 OUTPATIENT PHYSICAL THERAPY NEURO TREATMENT   Patient Name: Matthew Black MRN: [id number] DOB:10/29/1976, 48 y.o., male Today's Date: 10/17/2024   PCP: Dorina Loving, PA-C REFERRING PROVIDER: Lorilee Sven SQUIBB, MD  END OF SESSION:         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   Stroke (HCC)    RICA, R A1, R MCA occlusion 07/19/19   Past Surgical History:  Procedure Laterality Date   CRANIOPLASTY Right 06/05/2020   Procedure: CRANIOPLASTY;  Surgeon: Lanis Pupa, MD;  Location: [facility] OR;  Service: Neurosurgery;  Laterality: Right;  right   CRANIOTOMY Right 07/19/2019   Procedure: RIGHT HEMI-CRANIECTOMY With implantation of skull flap to abdominal wall;  Surgeon: Lanis Pupa, MD;  Location: [facility] OR;  Service: Neurosurgery;  Laterality: Right;   CYST EXCISION N/A 10/08/2016   Procedure: EXCISION OF POSTERIOR NECK CYST;  Surgeon: Mitzie DELENA Freund, MD;  Location: [facility] ORS;  Service: General;  Laterality: N/A;   CYSTOSCOPY/URETEROSCOPY/HOLMIUM LASER/STENT PLACEMENT Right 03/16/2020   Procedure: CYSTOSCOPY RIGHT RETROGRADE PYELOGRAM URETEROSCOPY/HOLMIUM LASER/STENT PLACEMENT;  Surgeon: Carolee Sherwood JONETTA DOUGLAS, MD;  Location: [facility];  Service: Urology;  Laterality: Right;   INCISION AND DRAINAGE ABSCESS N/A 09/22/2014   Procedure: INCISION AND DRAINAGE ABSCESS POSTERIOR NECK;  Surgeon: Donnice KATHEE Lunger, MD;  Location: [facility];  Service: General;  Laterality: N/A;   INCISION AND DRAINAGE ABSCESS N/A 12/20/2015   Procedure: INCISION AND DRAINAGE POSTERIOR NECK MASS;  Surgeon: Krystal Spinner, MD;  Location: [facility];  Service: General;  Laterality: N/A;   INCISION AND DRAINAGE ABSCESS Left 07/10/2004   middle finger   IR IVC FILTER PLMT / S&I /IMG GUID/MOD SED  07/21/2019   IR RADIOLOGIST EVAL & MGMT  12/14/2019   IR RADIOLOGIST EVAL & MGMT  12/21/2020   IR VENOGRAM RENAL UNI RIGHT  07/21/2019   MASS EXCISION N/A 07/21/2017   Procedure: EXCISION OF BENIGN NECK LESION WITH LAYERED CLOSURE;  Surgeon: Arelia Filippo, MD;  Location: [facility];  Service: Plastics;  Laterality: N/A;   MASS EXCISION N/A 11/10/2017   Procedure: EXCISION BENIGN LESION OF THE NECK WITH LAYERED CLOSURE;  Surgeon: Arelia Filippo, MD;  Location: [facility];  Service: Plastics;  Laterality: N/A;   Patient Active Problem List   Diagnosis Date Noted   Hx of adenomatous colonic polyps 06/01/2023   COVID-19 virus infection 04/21/2021   Left-sided weakness 04/21/2021   S/P craniotomy 06/05/2020   History of cranioplasty 06/05/2020   Peri-rectal abscess 02/14/2020   Abnormal CT scan, pelvis 02/14/2020   Pancytopenia (HCC) 12/02/2019   Nephrolithiasis 12/02/2019   Hydronephrosis with renal and ureteral calculus obstruction 12/02/2019   Rectal pain 12/02/2019   Hyperkalemia 12/02/2019   Reactive depression    Wound infection after surgery    Sleep disturbance    Dysphagia, post-stroke    Transaminitis    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   Cerebral abscess    Urinary tract infection without hematuria    Altered mental status  Primary hypercoagulable state    Acute pulmonary embolism without acute cor pulmonale (HCC)    Deep vein thrombosis (DVT) of non-extremity vein    Hypokalemia    Acute blood loss anemia    Leukocytosis    Endotracheal tube present    Acute respiratory failure with hypoxemia (HCC)    Stroke (cerebrum) (HCC) 07/19/2019   Pressure injury of skin 07/19/2019   Acute CVA (cerebrovascular accident) [facility])    Encephalopathy     Dysphagia    Acute encephalopathy    Essential hypertension    Obesity 03/12/2016   Scalp abscess 12/20/2015   Neck abscess 12/20/2015   Pilonidal cyst 02/08/2013    ONSET DATE: stroke in August 2020  REFERRING DIAG: I63.9 (ICD-10-CM) - Cerebrovascular accident (CVA), unspecified mechanism (HCC)   THERAPY DIAG:    No diagnosis found.   Rationale for Evaluation and Treatment: Rehabilitation  SUBJECTIVE:                                                                                                                                                                                             SUBJECTIVE STATEMENT:   Pt reports doing okay with no further falls.    Of note: Botox  in L LE biceps femoris on 06/24/2024 Has the Motus Nova Foot device    Initial Eval: Patient reports his goal is to walk without using a cane and without assistance. Patient states he wants to walk like he did before having the stroke. Reports his stroke was in August of 2020. States he currently uses a quad cane for ambulation and tries to walk as much as possible. Reports he has a manual wheelchair, but states he tries not to use it because it depresses him and makes him feel like he is back to when he first had his stroke. Reports wearing L LE custom AFO at all times.   Reports he is currently doing everything mod-I, but states he could call his mom, dad, or brothers if he needed assistance.  Reports he has routine botox  injections in L UE to help manage tone, but doesn't receive any in his LE.  Reports he has a hospital bed, but sleeps in a regular bed.  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  Etiology of carotid occlusion unclear possibly dissection.  He had a prolonged hospital admission with several complications including DVT, pulmonary embolism, hemorrhagic transformation, abdominal wall hematoma, UTI but made quite remarkable recovery   PAIN:  Are you having pain? No, denies pain today, but states sometimes will have L shoulder pain  PRECAUTIONS: Fall  RED FLAGS: None   WEIGHT BEARING RESTRICTIONS: No  FALLS: Has patient fallen in last 6 months? Yes, stating he fell down his stairs ~5 months ago and is now scared to perform stair navigation  LIVING ENVIRONMENT: Lives with: lives alone Lives in: House/apartment Stairs: Yes: Internal: flight steps; can reach both and External: 4 steps; can reach both, but states he doesn't have to go upstairs Has following equipment at home: Quad cane small base, Hemi walker, Wheelchair (manual), shower chair, Shower bench, and L LE custom AFO Pt has custom K5 wheelchair with rigid, contoured back support and Roho cushion. Pt reports he calls [employer] to come adjust his seat cushion when he feels it is getting uncomfortable.  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  DIAGNOSTIC FINDINGS:  EXAM: CT ANGIOGRAPHY HEAD AND NECK WITH AND WITHOUT CONTRAST IMPRESSION: 1. No acute intracranial abnormality. 2. Chronically occluded right ICA with minimal opacification of  the right MCA and ACA branches. 3. Redemonstrated extensive encephalomalacia in the right MCA and ACA territories secondary to a prior infarct.  Electronically Signed   By: Lyndall Gore M.D.   On: 11/27/2023 13:45  COGNITION: Overall cognitive status: Within functional limits for tasks assessed  VISION (03/29/2024): Therapist performed visual field screen with pt appearing to have L homonomous hemianopsia - pt confirms he has loss of vision on L side.   SENSATION: Light touch: Impaired in L LE Proprioception: Impaired  in L LE  Can feel deep pressure in L LE   COORDINATION: Impaired in L LE due to paresis, hypertonia, and overall decreased flexibility  EDEMA:  Not formally assessed  MUSCLE TONE: LLE: Mild, Moderate, and Hypertonic  MUSCLE LENGTH: Not formally assessed; however, demonstrates L hamstring muscle tightness  DTRs:  Not formally assessed  POSTURE: rounded shoulders, forward head, and weight shift right  LOWER EXTREMITY ROM:       Right Eval Active ROM Left Eval Passive ROM  Hip flexion WFL Achieves at least 90  Hip extension    Hip abduction    Hip adduction    Hip internal rotation  Unable to achieve neutral hip rotation, lacking internal rotation  Hip external rotation  [facility] and rests in excessive ER  Knee flexion [facility]  Knee extension WFL Able to achieve terminal knee extension in supine, but with significant discomfort due to hamstring tone  Ankle dorsiflexion WFL Did not remove AFO to assess this date  Ankle plantarflexion WFL Did not remove AFO to assess this date  Ankle inversion    Ankle eversion     (Blank rows = not tested)  LOWER EXTREMITY MMT:    MMT Right Eval Left Eval  Hip flexion 4+ 2-  (compensates with hip adductor activation)  Hip extension  Hip abduction    Hip adduction  3+  Hip internal rotation  2-  Hip external rotation  2  Knee flexion 4+ 1  Knee extension 4+ 3+  Ankle dorsiflexion 4+ Did not remove AFO to  assess this date  Ankle plantarflexion 4+ Did not remove AFO to assess this date  Ankle inversion    Ankle eversion    (Blank rows = not tested)  Manual Muscle Test Scale 0/5 = No muscle contraction can be seen or felt 1/5 = Contraction can be felt, but there is no motion 2-/5 = Part moves through incomplete ROM w/ gravity decreased 2/5 = Part moves through complete ROM w/ gravity decreased 2+/5 = Part moves through incomplete ROM (<50%) against gravity or through complete ROM w/ gravity 3-/5 = Part moves through incomplete ROM (>50%) against gravity 3/5 = Part moves through complete ROM against gravity 3+/5 = Part moves through complete ROM against gravity/slight resistance 4-/5= Holds test position against slight to moderate pressure 4/5 = Part moves through complete ROM against gravity/moderate resistance 4+/5= Holds test position against moderate to strong pressure 5/5 = Part moves through complete ROM against gravity/full resistance  BED MOBILITY: (sleeps in regular bed, but has hospital bed available) Findings: Sit to supine CGA Supine to sit Min A and Mod A Rolling to Right Mod A Rolling to Left Min A and Mod A  TRANSFERS: Sit to stand: CGA and Min A  Assistive device utilized: Counselling psychologist     Stand to sit: CGA and Min A  Assistive device utilized: Tree Surgeon to chair: Min A  Assistive device utilized: Counselling psychologist       RAMP:  Not tested  CURB:  Not tested  STAIRS: Not tested *need to assess*  GAIT: Findings: advances L LE with excessive hip external rotation using hip adductors, step to pattern, decreased arm swing- Left, decreased step length- Right, decreased stance time- Left, decreased stride length, decreased hip/knee flexion- Left, decreased ankle dorsiflexion- Left, lateral lean- Right, and decreased trunk rotation Distance walked: ~49ft Assistive device utilized: small based quad cane in R UE  Level of assistance: CGA  and Min A  FUNCTIONAL TESTS:  5 times sit to stand: need to assess Timed up and go (TUG): need to assess 6 minute walk test: need to assess 10 meter walk test: 0.76m/s using small based quad cane and CGA Berg Balance Scale: need to assess  PATIENT SURVEYS:  Stroke Impact Scale 03/15/2024: 49/80                                                                                                                               TREATMENT DATE: 10/17/2024   Gait into therapy clinic using [facility] mod-I with only slight step-through pattern leading with L LE- Ambulated from waiting room to bathroom then from bathroom into clinic - approx 120 feet  Donned litegait harness with R UE support on quad cane . Of note, pt requiring additional seated rest break with donning again today- He reported the bottom straps felt too tight and restrictive- Loosen slightly but explained that the bottom needs to be tight around the Greater trochanter of femur to ensure good hold during unweighing.   Step up onto lite gait using lite gait handle with PT assisting pushing lite gait over TM for proper alignment   Gait training:  Litegait - using slight BWS for several trials on treadmill in litegait harness: *Pt using R UE on treadmill handle positioned slightly out for comfort throughout trials* 4 rounds of  around 2 min at speed of .8-1 MPH - total distance  Total distance 512 ft across all trials   Stepped off treadmill in litegait harness for balance safety and therapist providing min manual facilitation for L LE management.   PATIENT EDUCATION: Education details: Therapy POC, LTGs, HEP, importance of improving L LE flexibility, findings during assessment  Person educated: Patient Education method: Explanation and Handouts Education comprehension: verbalized understanding and needs further education  HOME EXERCISE PROGRAM:  Access Code: [email or web address] URL: [email or web address] Date:  03/10/2024 Prepared by: Connell Kiss  Exercises - Seated Hip Adduction Isometrics with Ball  - 1 x daily - 7 x weekly - 2 sets - 10 reps - 5 seconds hold - Supine Quadricep Sets  - 1 x daily - 7 x weekly - 2 sets - 10 reps - 3 seconds hold Use of Motus Nova Foot device for at least 1hour every day  GOALS: Goals reviewed with patient? Yes  SHORT TERM GOALS: Target date: 07/13/2024  Patient will be independent in home exercise program to improve strength/mobility for better functional independence with ADLs.  Baseline: initiated on 03/10/2024 04/14/2024: continued Goal status: IN PROGRESS   LONG TERM GOALS: Target date: 11/16/2024  Patient (< 29 years old) will complete five times sit to stand (5XSTS) test in < 10 seconds indicating an increased LE strength and improved balance.  Baseline: 03/15/2024: 16.53 seconds from standard green chair relying on R hand to push-up from armrest and pt's personal SBQC nearby, but not using it VERSUS 25.85 seconds with R hand across chest, with min A for balance/safety without use of UE support 04/14/2024: 8.96 seconds using R UE support to push-up from armrest and pt's SBQC next to him, 13.75 seconds with R hand across chest with min A for therapist to stabilize chair behind him 05/30/24: 13.01 sec with R UE support on chair; 15.26 sec with R hand across chest and minA from therapist to stabilize chair behind pt 08/10/2024: 12.56 seconds using R UE support on chair armrest & 14.20 seconds with R UE across chest & chair against wall 09/20/24: 23.01 with no UE assistance and one episode of LOB; 18.86 with UE support on SBQC Goal status: IN PROGRESS  2.  Patient will increase Berg Balance score to > 45/56 to demonstrate improved balance and decreased fall risk during functional activities and ADLs.  Baseline: 03/15/2024: 14/56 04/14/2024: 22/56 05/30/24: TBD at next visit 07/27/2024: 33/56 Goal status: IN PROGRESS  3.  Patient will increase 10 meter walk test to  >1.77m/s using LRAD as to improve gait speed for better community ambulation and to reduce fall risk.  Baseline: 0.30 m/s using small based quad cane with CGA and 1x light min A due to balance instability  04/14/2024: 0.405 m/s using SBQC with close SBA for safety 05/30/24: 0.379 m/s using  SBQC with close SBA for safety.  07/21/2024= 0.43 m/s with SBQC with close SBA 09/20/24: 0.40 m/s with SBQC and close SBA, 25.10 sec Goal status: IN PROGRESS  4.  Patient will increase six minute walk test distance to >582ft for progression towards community ambulator and improve gait ability  Baseline: 275ft with [facility]  04/20/2024:  266ft with SBQC and SBA 05/30/24: 360 ft with [facility] 08/10/2024: 416 feet (126.80 meters, Avg speed 0.49m/s) using SBQC mod-I. 09/20/24: 282' in 3 min 27 sec before needing to sit and terminate the test  Goal status: IN PROGRESS  5.  Patient will reduce timed up and go to <15 seconds to reduce fall risk and demonstrate improved transfer/gait ability. Baseline: 03/15/2024: 25.32 seconds using SBQC and chair with armrest as well as CGA for safety/steadying 04/14/2024: 22.25 seconds using SBQC & chair w/ armrest and close SBA for safety 05/30/24: 26.87 seconds w/ SBQC and use; 07/21/2024= 21.13 sec with SBQC and SBA 07/21/2024:  21.13 sec with SBQC and SBA  09/20/24: 24.7 sec with SBQC and SBA Goal status: IN PROGRESS  6.  Patient will ascend/descend 4 stairs, using railing, independently without loss of balance to improve ability to get in/out of home.   Baseline: 03/31/2024: using R UE support on each HR with skilled min assist for balance using primarily step-to pattern 04/20/2024:  using R UE support on each HR with skilled CGA for safety using step-to pattern 05/30/24: continued use of R UE support on HR with skilled CGA for safety using step-to pattern 08/10/2024: R UE support on HR, step-to pattern, supervision only Goal status: IN PROGRESS  ASSESSMENT:  CLINICAL IMPRESSION:     Treatment continued to focus on gait training using Litegait for High intensity gait training on TM to focus on reciprocal steps. Patient intially struggle with timing yet able to progress throughout session-slight decrease in overall distance but did progress with time today. His confidence was limited working with different PT but became more comfortable during session. He was ultimately limited by fatigue. Will continue participating in HIGT on the treadmill, which is an intervention that has been shown to have significant impact on gait speed and endurance. Continue with POC as described pending additional insurance approval to achieve LTGs.  Pt will continue to benefit from skilled therapy to address remaining deficits in order to improve overall QoL and return to PLOF.     OBJECTIVE IMPAIRMENTS: Abnormal gait, cardiopulmonary status limiting activity, decreased activity tolerance, decreased balance, decreased coordination, decreased endurance, decreased knowledge of use of DME, decreased mobility, difficulty walking, decreased ROM, decreased strength, hypomobility, impaired flexibility, impaired sensation, impaired tone, impaired UE functional use, improper body mechanics, postural dysfunction, and pain.   ACTIVITY LIMITATIONS: carrying, lifting, bending, standing, squatting, sleeping, stairs, transfers, bed mobility, continence, bathing, toileting, dressing, reach over head, hygiene/grooming, locomotion level, and caring for others  PARTICIPATION LIMITATIONS: meal prep, cleaning, laundry, medication management, shopping, community activity, and yard work  PERSONAL FACTORS: Age, Past/current experiences, Time since onset of injury/illness/exacerbation, Transportation, and 3+ comorbidities: HTN, hx of kidney stones, depression are also affecting patient's functional outcome.   REHAB POTENTIAL: Good  CLINICAL DECISION MAKING: Evolving/moderate complexity  EVALUATION COMPLEXITY:  Moderate  PLAN:  PT FREQUENCY: 1-2x/week  PT DURATION: 12 weeks  PLANNED INTERVENTIONS: [id number]- PT Re-evaluation, 97750- Physical Performance Testing, 97110-Therapeutic exercises, 97530- Therapeutic activity, [id number]- Neuromuscular re-education, 97535- Self Care, [id number]- Manual therapy, [id number]- Gait training, [id number]- Orthotic/Prosthetic subsequent, [phone]- Canalith repositioning, [id number]- Electrical stimulation (manual), Patient/Family education, Balance training,  Stair training, Taping, Dry Needling, Joint mobilization, Joint manipulation, Vestibular training, Visual/preceptual remediation/compensation, DME instructions, Cryotherapy, Moist heat, and Biofeedback  PLAN FOR NEXT SESSION:   *HIGT on treadmill* Continue +2 A for L wt shift & increase harness support - goal to decrease reliance on R UE support Increasing speed and gradually work towards adding L LE AW or variable direction walking or no R UE support  Progress HEP  high intensity gait training using +2 HHA vs litegait support vs SBQC continue adding 7lb AW to L LE when appropriate vs decreasing RUE support/reliance while in litegait Backwards gait training - continue Stepping over obstacles - continue Continue stair training for L LE NMR For stance   R foot taps while sustaining L hip extension For swing phase Reciprocal foot taps L LE stretching and weightbearing for tone management and to improve alignment during gait/standing (avoid excessive hip ER) Non gait training ideas: bridges with R foot placed on unstable surface to promote increased L LE wt bearing      Reyes LOISE London PT  Physical Therapist- [facility]  [facility]    10/17/24, 3:54 PM

## 2024-10-18 ENCOUNTER — Ambulatory Visit: Admitting: Medical

## 2024-10-18 ENCOUNTER — Encounter: Payer: Self-pay | Admitting: Medical

## 2024-10-18 ENCOUNTER — Telehealth: Payer: Self-pay | Admitting: Medical

## 2024-10-18 VITALS — BP 118/70 | HR 67 | Temp 98.4°F | Resp 14 | Ht 71.0 in | Wt 259.0 lb

## 2024-10-18 DIAGNOSIS — W19XXXD Unspecified fall, subsequent encounter: Secondary | ICD-10-CM | POA: Diagnosis not present

## 2024-10-18 DIAGNOSIS — R739 Hyperglycemia, unspecified: Secondary | ICD-10-CM | POA: Diagnosis not present

## 2024-10-18 DIAGNOSIS — I10 Essential (primary) hypertension: Secondary | ICD-10-CM | POA: Diagnosis not present

## 2024-10-18 DIAGNOSIS — Z8673 Personal history of transient ischemic attack (TIA), and cerebral infarction without residual deficits: Secondary | ICD-10-CM

## 2024-10-18 NOTE — Patient Instructions (Signed)
 Emergency department follow-up after fall with hypotension Recent fall with hypotension likely due to dehydration. Blood pressure now stable at 118/70 mmHg. Differential includes dehydration and anemia. - Ordered CBC and metabolic panel. - Advised increased hydration with low sugar Gatorade or [employer] Water . - Follow up based on lab results.  Essential hypertension, currently well controlled Hypertension well controlled at 118/70 mmHg. Recent hypotension likely due to dehydration, not hypertension management. - Continue current hypertension management. -stay on current metoprolol  dose.  History of stroke and TIA without residual deficits No new deficits/stable - Continue current management and monitoring.  Hyperglycemia, unspecified Previous blood sugar levels near diabetic range. No recent blood work in 11 months. - Ordered three-month blood sugar average test.  Follow up date to be determined after lab review

## 2024-10-18 NOTE — Telephone Encounter (Signed)
 I got below message pt wants to transfer. I approved but today when pt came in for ED follow up he states he did not request transfer?? Can you investigate why this office stated he wanted to transfere   Pt is requesting to transfer FROM: DORINA, Matthew Black Pt is requesting to transfer TO: Matthew Morrow MD Reason for requested transfer: [facility] It is the responsibility of the team the patient would like to transfer to (Dr. Vita) to reach out to the patient if for any reason this transfer is not acceptable.

## 2024-10-18 NOTE — Progress Notes (Signed)
 Subjective:    Patient ID: Matthew Black, male    DOB: March 03, 1976, 48 y.o.   MRN: [id number]  HPI  Discussed the use of AI scribe software for clinical note transcription with the patient, who gave verbal consent to proceed.  History of Present Illness   Matthew Black is a 48 year old male with a remote history of stroke who presents for follow-up after a fall and hypotension. He went to the ED and no work up done. See that office note.  He had a recent mechanical fall while bending to pick something up after misplacing his cane. He sustained no injury. He usually ambulates with a cane and is otherwise able to walk long distances.  His blood pressure has been low, with readings around 90/60 mmHg at the dentist on 11/5 and in the ED on 11/11. Home and clinic readings were previously hypertensive at times but have recently been lower, including 118/70 mmHg today.  He reports poor oral intake of fluids, as he dislikes the water  available at home and has not been drinking other hydrating beverages.  He has a prior stroke. Blood sugar was 135 mg/dL about 11 months ago, approaching the diabetic range.       Review of Systems  Constitutional:  Negative for chills and fatigue.  HENT:  Negative for congestion.   Respiratory:  Negative for cough and chest tightness.   Cardiovascular:  Negative for chest pain and palpitations.  Gastrointestinal:  Negative for abdominal pain.  Musculoskeletal:  Negative for back pain.  Neurological:  Negative for dizziness and light-headedness.       Stable  Psychiatric/Behavioral:  Negative for behavioral problems and decreased concentration.     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   Stroke (HCC)    RICA, R A1, R MCA occlusion 07/19/19     Social History   Socioeconomic History   Marital status: Married    Spouse name: Kary   Number of children: 2   Years of education: Not on file   Highest education level: Not on file  Occupational History   Occupation: unemployed 01/30/21  Tobacco Use   Smoking status: Former    Current packs/day: 0.00    Types: Cigarettes    Quit date: 11/24/2015    Years since quitting: 8.9   Smokeless tobacco: Never   Tobacco comments:       Vaping Use   Vaping status: Never Used  Substance and Sexual Activity   Alcohol use: Yes    Comment: socially   Drug use: Never   Sexual activity: Not Currently  Other Topics Concern   Not on file  Social History Narrative   Lives with wife and kids   Right Handed   Drinks >10 cups caffeine daily   Social Drivers of Health   Financial Resource Strain: Low Risk  (10/13/2023)   Overall Financial Resource Strain (CARDIA)    Difficulty of Paying Living Expenses: Not hard at all  Food Insecurity: No Food Insecurity (  10/13/2023)   Hunger Vital Sign    Worried About Running Out of Food in the Last Year: Never true    Ran Out of Food in the Last Year: Never true  Transportation Needs: No Transportation Needs (10/13/2023)   PRAPARE - Administrator, Civil Service (Medical): No    Lack of Transportation (Non-Medical): No  Physical Activity: Inactive (10/13/2023)   Exercise Vital Sign    Days of Exercise per Week: 0 days    Minutes of Exercise per Session: 0 min  Stress: No Stress Concern Present (10/13/2023)   [employer] of Occupational Health - Occupational Stress Questionnaire    Feeling of Stress : Not at all  Social Connections: Socially Integrated (10/13/2023)   Social Connection and Isolation Panel    Frequency of  Communication with Friends and Family: More than three times a week    Frequency of Social Gatherings with Friends and Family: More than three times a week    Attends Religious Services: More than 4 times per year    Active Member of Clubs or Organizations: Yes    Attends Banker Meetings: More than 4 times per year    Marital Status: Married  Catering Manager Violence: Not At Risk (10/13/2023)   Humiliation, Afraid, Rape, and Kick questionnaire    Fear of Current or Ex-Partner: No    Emotionally Abused: No    Physically Abused: No    Sexually Abused: No    Past Surgical History:  Procedure Laterality Date   CRANIOPLASTY Right 06/05/2020   Procedure: CRANIOPLASTY;  Surgeon: Lanis Pupa, MD;  Location: [facility] OR;  Service: Neurosurgery;  Laterality: Right;  right   CRANIOTOMY Right 07/19/2019   Procedure: RIGHT HEMI-CRANIECTOMY With implantation of skull flap to abdominal wall;  Surgeon: Lanis Pupa, MD;  Location: [facility] OR;  Service: Neurosurgery;  Laterality: Right;   CYST EXCISION N/A 10/08/2016   Procedure: EXCISION OF POSTERIOR NECK CYST;  Surgeon: Mitzie DELENA Freund, MD;  Location: [facility];  Service: General;  Laterality: N/A;   CYSTOSCOPY/URETEROSCOPY/HOLMIUM LASER/STENT PLACEMENT Right 03/16/2020   Procedure: CYSTOSCOPY RIGHT RETROGRADE PYELOGRAM URETEROSCOPY/HOLMIUM LASER/STENT PLACEMENT;  Surgeon: Carolee Sherwood JONETTA DOUGLAS, MD;  Location: [facility];  Service: Urology;  Laterality: Right;   INCISION AND DRAINAGE ABSCESS N/A 09/22/2014   Procedure: INCISION AND DRAINAGE ABSCESS POSTERIOR NECK;  Surgeon: Donnice KATHEE Lunger, MD;  Location: [facility];  Service: General;  Laterality: N/A;   INCISION AND DRAINAGE ABSCESS N/A 12/20/2015   Procedure: INCISION AND DRAINAGE POSTERIOR NECK MASS;  Surgeon: Krystal Spinner, MD;  Location: [facility];  Service: General;  Laterality: N/A;   INCISION AND DRAINAGE ABSCESS Left 07/10/2004   middle finger   IR IVC FILTER PLMT / S&I /IMG GUID/MOD SED   07/21/2019   IR RADIOLOGIST EVAL & MGMT  12/14/2019   IR RADIOLOGIST EVAL & MGMT  12/21/2020   IR VENOGRAM RENAL UNI RIGHT  07/21/2019   MASS EXCISION N/A 07/21/2017   Procedure: EXCISION OF BENIGN NECK LESION WITH LAYERED CLOSURE;  Surgeon: Arelia Filippo, MD;  Location: [facility];  Service: Plastics;  Laterality: N/A;   MASS EXCISION N/A 11/10/2017   Procedure: EXCISION BENIGN LESION OF THE NECK WITH LAYERED CLOSURE;  Surgeon: Arelia Filippo, MD;  Location: [facility];  Service: Plastics;  Laterality: N/A;    Family History  Problem Relation Age of Onset   Healthy Mother    Healthy Father    Clotting disorder Maternal Aunt  Clotting disorder Maternal Uncle    Clotting disorder Maternal Grandmother    Diabetes Paternal Grandfather    Colon cancer Neg Hx    Rectal cancer Neg Hx    Stomach cancer Neg Hx     Allergies  Allergen Reactions   Hydrocodone  Nausea Only    Current Outpatient Medications on File Prior to Visit  Medication Sig Dispense Refill   ASPIRIN  LOW DOSE 81 MG tablet TAKE 1 TABLET (81 MG TOTAL) BY MOUTH 5 (FIVE) TIMES DAILY. NEEDS APPT 30 tablet 0   atorvastatin  (LIPITOR) 10 MG tablet TAKE 1 TABLET BY MOUTH DAILY 100 tablet 2   baclofen  (LIORESAL ) 10 MG tablet Take 1 tablet (10 mg total) by mouth 3 (three) times daily. 270 tablet 1   metoprolol  tartrate (LOPRESSOR ) 50 MG tablet TAKE 1 TABLET BY MOUTH TWICE  DAILY 200 tablet 2   tiZANidine  (ZANAFLEX ) 4 MG tablet Take 1 tablet (4 mg total) by mouth at bedtime as needed for muscle spasms. 90 tablet 3   No current facility-administered medications on file prior to visit.    BP 118/70   Pulse 67   Temp 98.4 F (36.9 C) (Oral)   Resp 14   Ht 5' 11 (1.803 m)   Wt 259 lb (117.5 kg)   SpO2 98%   BMI 36.12 kg/m         Objective:   Physical Exam  General Mental Status- Alert. General Appearance- Not in acute distress.    Skin General: Color- Normal Color. Moisture-  Normal Moisture.   Neck Carotid Arteries- Normal color. Moisture- Normal Moisture. No carotid bruits. No JVD.   Chest and Lung Exam Auscultation: Breath Sounds:-Normal.   Cardiovascular Auscultation:Rythm- Regular. Murmurs & Other Heart Sounds:Auscultation of the heart reveals- No Murmurs.   Abdomen Inspection:-Inspeection Normal. Palpation/Percussion:Note:No mass. Palpation and Percussion of the abdomen reveal- Non Tender, Non Distended + BS, no rebound or guarding.   Neurologic(no change/stable) Cranial Nerve exam:- CN III-XII intact(No nystagmus), symmetric smile. Drift Test:- No drift. Romberg Exam:- Negative.  Heal to Toe Gait exam:-Normal. Finger to Nose:- Normal/Intact Strength:- rt upper and lower ext normal. Left upper and lower ext- 1/5 weakness. Minimal range of motion.    Assessment & Plan:   Assessment and Plan    Emergency department follow-up after fall with hypotension Recent fall with hypotension likely due to dehydration. Blood pressure now stable at 118/70 mmHg. Differential includes dehydration and anemia. - Ordered CBC and metabolic panel. - Advised increased hydration with low sugar Gatorade or [employer] Water . - Follow up based on lab results.  Essential hypertension, currently well controlled Hypertension well controlled at 118/70 mmHg. Recent hypotension likely due to dehydration, not hypertension management. - Continue current hypertension management. -stay on current metoprolol  dose.  History of stroke and TIA without residual deficits No new deficits/stable - Continue current management and monitoring.  Hyperglycemia, unspecified Previous blood sugar levels near diabetic range. No recent blood work in 11 months. - Ordered three-month blood sugar average test.   Follow up date to be determined after lab review

## 2024-10-19 ENCOUNTER — Ambulatory Visit: Payer: Self-pay | Admitting: Medical

## 2024-10-19 LAB — CBC WITH DIFFERENTIAL/PLATELET
Basophils Absolute: 0.1 K/uL (ref 0.0–0.1)
Basophils Relative: 1.3 % (ref 0.0–3.0)
Eosinophils Absolute: 0.2 K/uL (ref 0.0–0.7)
Eosinophils Relative: 2.2 % (ref 0.0–5.0)
HCT: 48.9 % (ref 39.0–52.0)
Hemoglobin: 16 g/dL (ref 13.0–17.0)
Lymphocytes Relative: 45.8 % (ref 12.0–46.0)
Lymphs Abs: 3.4 K/uL (ref 0.7–4.0)
MCHC: 32.8 g/dL (ref 30.0–36.0)
MCV: 81.8 fl (ref 78.0–100.0)
Monocytes Absolute: 0.7 K/uL (ref 0.1–1.0)
Monocytes Relative: 10 % (ref 3.0–12.0)
Neutro Abs: 3 K/uL (ref 1.4–7.7)
Neutrophils Relative %: 40.7 % — ABNORMAL LOW (ref 43.0–77.0)
Platelets: 256 K/uL (ref 150.0–400.0)
RBC: 5.97 Mil/uL — ABNORMAL HIGH (ref 4.22–5.81)
RDW: 15.4 % (ref 11.5–15.5)
WBC: 7.4 K/uL (ref 4.0–10.5)

## 2024-10-19 LAB — COMPREHENSIVE METABOLIC PANEL WITH GFR
ALT: 26 U/L (ref 0–53)
AST: 22 U/L (ref 0–37)
Albumin: 4.4 g/dL (ref 3.5–5.2)
Alkaline Phosphatase: 135 U/L — ABNORMAL HIGH (ref 39–117)
BUN: 12 mg/dL (ref 6–23)
CO2: 28 meq/L (ref 19–32)
Calcium: 9.3 mg/dL (ref 8.4–10.5)
Chloride: 106 meq/L (ref 96–112)
Creatinine, Ser: 1.09 mg/dL (ref 0.40–1.50)
GFR: 81.17 mL/min (ref >60.00)
Glucose, Bld: 79 mg/dL (ref 70–99)
Potassium: 4.5 meq/L (ref 3.5–5.1)
Sodium: 142 meq/L (ref 135–145)
Total Bilirubin: 0.7 mg/dL (ref 0.2–1.2)
Total Protein: 8.1 g/dL (ref 6.0–8.3)

## 2024-10-19 LAB — LIPID PANEL
Cholesterol: 121 mg/dL (ref 0–200)
HDL: 32.9 mg/dL — ABNORMAL LOW (ref >39.00)
LDL Cholesterol: 63 mg/dL (ref 0–99)
NonHDL: 88.1
Total CHOL/HDL Ratio: 4
Triglycerides: 128 mg/dL (ref 0.0–149.0)
VLDL: 25.6 mg/dL (ref 0.0–40.0)

## 2024-10-19 LAB — HEMOGLOBIN A1C: Hgb A1c MFr Bld: 5.8 % (ref 4.6–6.5)

## 2024-10-24 ENCOUNTER — Ambulatory Visit

## 2024-10-24 NOTE — Therapy (Incomplete)
 OUTPATIENT PHYSICAL THERAPY NEURO TREATMENT   Patient Name: Matthew Black MRN: [id number] DOB:Sep 24, 1976, 48 y.o., male Today's Date: 10/24/2024   PCP: Dorina Loving, PA-C REFERRING PROVIDER: Lorilee Sven SQUIBB, MD  END OF SESSION:         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;  Service: Neurosurgery;  Laterality: Right;  right   CRANIOTOMY Right 07/19/2019   Procedure: RIGHT HEMI-CRANIECTOMY With implantation of skull flap to abdominal wall;  Surgeon: Lanis Pupa, MD;  Location: [facility] OR;  Service: Neurosurgery;  Laterality: Right;   CYST EXCISION N/A 10/08/2016   Procedure: EXCISION OF POSTERIOR NECK CYST;  Surgeon: Mitzie DELENA Freund, MD;  Location: [facility];  Service: General;  Laterality: N/A;   CYSTOSCOPY/URETEROSCOPY/HOLMIUM LASER/STENT PLACEMENT Right 03/16/2020   Procedure: CYSTOSCOPY RIGHT RETROGRADE PYELOGRAM URETEROSCOPY/HOLMIUM LASER/STENT PLACEMENT;  Surgeon: Carolee Sherwood JONETTA DOUGLAS, MD;  Location: [facility];  Service: Urology;  Laterality: Right;   INCISION AND DRAINAGE ABSCESS N/A 09/22/2014   Procedure: INCISION AND DRAINAGE ABSCESS POSTERIOR NECK;  Surgeon: Donnice KATHEE Lunger, MD;  Location: [facility];  Service: General;  Laterality: N/A;   INCISION AND DRAINAGE ABSCESS N/A 12/20/2015   Procedure: INCISION AND DRAINAGE POSTERIOR NECK MASS;  Surgeon: Krystal Spinner, MD;  Location: [facility];  Service: General;  Laterality: N/A;   INCISION AND DRAINAGE ABSCESS Left 07/10/2004   middle finger   IR IVC FILTER PLMT / S&I /IMG GUID/MOD SED  07/21/2019   IR RADIOLOGIST EVAL & MGMT  12/14/2019   IR RADIOLOGIST EVAL & MGMT  12/21/2020   IR VENOGRAM RENAL UNI RIGHT  07/21/2019   MASS EXCISION N/A 07/21/2017   Procedure: EXCISION OF BENIGN NECK LESION WITH LAYERED CLOSURE;  Surgeon: Arelia Filippo, MD;  Location: [facility];  Service: Plastics;  Laterality: N/A;   MASS EXCISION N/A 11/10/2017   Procedure: EXCISION BENIGN LESION OF THE NECK WITH LAYERED CLOSURE;  Surgeon: Arelia Filippo, MD;  Location: [facility];  Service: Plastics;  Laterality: N/A;   Patient Active Problem List   Diagnosis Date Noted   Hx of adenomatous colonic polyps 06/01/2023   COVID-19 virus infection 04/21/2021   Left-sided weakness 04/21/2021   S/P craniotomy 06/05/2020   History of cranioplasty 06/05/2020   Peri-rectal abscess 02/14/2020   Abnormal CT scan, pelvis 02/14/2020   Pancytopenia (HCC) 12/02/2019   Nephrolithiasis 12/02/2019   Hydronephrosis with renal and ureteral calculus obstruction 12/02/2019   Rectal pain 12/02/2019   Hyperkalemia 12/02/2019   Reactive depression    Wound infection after surgery    Sleep disturbance    Dysphagia, post-stroke    Transaminitis    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   Cerebral abscess    Urinary tract infection without hematuria    Altered mental status  Primary hypercoagulable state    Acute pulmonary embolism without acute cor pulmonale (HCC)    Deep vein thrombosis (DVT) of non-extremity vein    Hypokalemia    Acute blood loss anemia    Leukocytosis    Endotracheal tube present    Acute respiratory failure with hypoxemia (HCC)    Stroke (cerebrum) (HCC) 07/19/2019   Pressure injury of skin 07/19/2019   Acute CVA (cerebrovascular accident) [facility])    Encephalopathy     Dysphagia    Acute encephalopathy    Essential hypertension    Obesity 03/12/2016   Scalp abscess 12/20/2015   Neck abscess 12/20/2015   Pilonidal cyst 02/08/2013    ONSET DATE: stroke in August 2020  REFERRING DIAG: I63.9 (ICD-10-CM) - Cerebrovascular accident (CVA), unspecified mechanism (HCC)   THERAPY DIAG:    No diagnosis found.   Rationale for Evaluation and Treatment: Rehabilitation  SUBJECTIVE:                                                                                                                                                                                             SUBJECTIVE STATEMENT:   ***   Of note: Botox  in L LE biceps femoris on 06/24/2024 Has the Motus Nova Foot device    Initial Eval: Patient reports his goal is to walk without using a cane and without assistance. Patient states he wants to walk like he did before having the stroke. Reports his stroke was in August of 2020. States he currently uses a quad cane for ambulation and tries to walk as much as possible. Reports he has a manual wheelchair, but states he tries not to use it because it depresses him and makes him feel like he is back to when he first had his stroke. Reports wearing L LE custom AFO at all times.   Reports he is currently doing everything mod-I, but states he could call his mom, dad, or brothers if he needed assistance.  Reports he has routine botox  injections in L UE to help manage tone, but doesn't receive any in his LE.  Reports he has a hospital bed, but sleeps in a regular bed.  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  Etiology of carotid  occlusion unclear possibly dissection.  He had a prolonged hospital admission with several complications including DVT, pulmonary embolism, hemorrhagic transformation, abdominal wall hematoma, UTI but made quite remarkable recovery   PAIN:  Are you having pain? No, denies pain today, but states sometimes will have L shoulder pain  PRECAUTIONS: Fall  RED FLAGS: None   WEIGHT BEARING RESTRICTIONS: No  FALLS: Has patient fallen in last 6 months? Yes, stating he fell down his stairs ~5 months ago and is now scared to perform stair navigation  LIVING ENVIRONMENT: Lives with: lives alone Lives in: House/apartment Stairs: Yes: Internal: flight steps; can reach both and External: 4 steps; can reach both, but states he doesn't have to go upstairs Has following equipment at home: Quad cane small base, Hemi walker, Wheelchair (manual), shower chair, Shower bench, and L LE custom AFO Pt has custom K5 wheelchair with rigid, contoured back support and Roho cushion. Pt reports he calls [employer] to come adjust his seat cushion when he feels it is getting uncomfortable.  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  DIAGNOSTIC FINDINGS:  EXAM: CT ANGIOGRAPHY HEAD AND NECK WITH AND WITHOUT CONTRAST IMPRESSION: 1. No acute intracranial abnormality. 2. Chronically occluded right ICA with minimal opacification of the right MCA and ACA branches. 3.  Redemonstrated extensive encephalomalacia in the right MCA and ACA territories secondary to a prior infarct.  Electronically Signed   By: Lyndall Gore M.D.   On: 11/27/2023 13:45  COGNITION: Overall cognitive status: Within functional limits for tasks assessed  VISION (03/29/2024): Therapist performed visual field screen with pt appearing to have L homonomous hemianopsia - pt confirms he has loss of vision on L side.   SENSATION: Light touch: Impaired in L LE Proprioception: Impaired  in L LE  Can feel deep pressure in L LE   COORDINATION: Impaired in L LE due to paresis, hypertonia, and overall decreased flexibility  EDEMA:  Not formally assessed  MUSCLE TONE: LLE: Mild, Moderate, and Hypertonic  MUSCLE LENGTH: Not formally assessed; however, demonstrates L hamstring muscle tightness  DTRs:  Not formally assessed  POSTURE: rounded shoulders, forward head, and weight shift right  LOWER EXTREMITY ROM:       Right Eval Active ROM Left Eval Passive ROM  Hip flexion WFL Achieves at least 90  Hip extension    Hip abduction    Hip adduction    Hip internal rotation  Unable to achieve neutral hip rotation, lacking internal rotation  Hip external rotation  [facility] and rests in excessive ER  Knee flexion [facility]  Knee extension WFL Able to achieve terminal knee extension in supine, but with significant discomfort due to hamstring tone  Ankle dorsiflexion WFL Did not remove AFO to assess this date  Ankle plantarflexion WFL Did not remove AFO to assess this date  Ankle inversion    Ankle eversion     (Blank rows = not tested)  LOWER EXTREMITY MMT:    MMT Right Eval Left Eval  Hip flexion 4+ 2-  (compensates with hip adductor activation)  Hip extension    Hip abduction    Hip adduction  3+  Hip internal rotation  2-  Hip external rotation  2  Knee flexion 4+ 1  Knee extension 4+ 3+  Ankle dorsiflexion 4+ Did not remove AFO to assess this date  Ankle plantarflexion  4+ Did not remove AFO to assess this date  Ankle inversion    Ankle eversion    (Blank rows = not tested)  Manual Muscle Test Scale 0/5 = No muscle contraction can be seen or felt 1/5 = Contraction can be felt, but there is no motion 2-/5 = Part moves through incomplete ROM w/ gravity decreased 2/5 = Part moves through complete ROM w/ gravity decreased 2+/5 = Part moves through incomplete ROM (<50%) against gravity or through complete ROM w/ gravity 3-/5 = Part moves through incomplete ROM (>50%) against gravity 3/5 = Part moves through complete ROM against gravity 3+/5 = Part moves through complete ROM against gravity/slight resistance 4-/5= Holds test position against slight to moderate pressure 4/5 = Part moves through complete ROM against gravity/moderate resistance 4+/5= Holds test position against moderate to strong pressure 5/5 = Part moves through complete ROM against gravity/full resistance  BED MOBILITY: (sleeps in regular bed, but has hospital bed available) Findings: Sit to supine CGA Supine to sit Min A and Mod A Rolling to Right Mod A Rolling to Left Min A and Mod A  TRANSFERS: Sit to stand: CGA and Min A  Assistive device utilized: Counselling psychologist     Stand to sit: CGA and Min A  Assistive device utilized: Tree Surgeon to chair: Min A  Assistive device utilized: Counselling psychologist       RAMP:  Not tested  CURB:  Not tested  STAIRS: Not tested *need to assess*  GAIT: Findings: advances L LE with excessive hip external rotation using hip adductors, step to pattern, decreased arm swing- Left, decreased step length- Right, decreased stance time- Left, decreased stride length, decreased hip/knee flexion- Left, decreased ankle dorsiflexion- Left, lateral lean- Right, and decreased trunk rotation Distance walked: ~43ft Assistive device utilized: small based quad cane in R UE  Level of assistance: CGA and Min A  FUNCTIONAL TESTS:  5 times  sit to stand: need to assess Timed up and go (TUG): need to assess 6 minute walk test: need to assess 10 meter walk test: 0.46m/s using small based quad cane and CGA Berg Balance Scale: need to assess  PATIENT SURVEYS:  Stroke Impact Scale 03/15/2024: 49/80                                                                                                                               TREATMENT DATE: 10/24/2024   ***  Gait into therapy clinic using SBQC mod-I with only slight step-through pattern leading with L LE- Ambulated from waiting room to bathroom then from bathroom into clinic - approx 120 feet   Donned litegait harness with R  UE support on quad cane . Of note, pt requiring additional seated rest break with donning again today- He reported the bottom straps felt too tight and restrictive- Loosen slightly but explained that the bottom needs to be tight around the Greater trochanter of femur to ensure good hold during unweighing.   Step up onto lite gait using lite gait handle with PT assisting pushing lite gait over TM for proper alignment   Gait training:  Litegait - using slight BWS for several trials on treadmill in litegait harness: *Pt using R UE on treadmill handle positioned slightly out for comfort throughout trials* 4 rounds of  around 2 min at speed of .8-1 MPH - total distance  Total distance 512 ft across all trials   Stepped off treadmill in litegait harness for balance safety and therapist providing min manual facilitation for L LE management.   PATIENT EDUCATION: Education details: Therapy POC, LTGs, HEP, importance of improving L LE flexibility, findings during assessment  Person educated: Patient Education method: Explanation and Handouts Education comprehension: verbalized understanding and needs further education  HOME EXERCISE PROGRAM:  Access Code: [email or web address] URL: [email or web address] Date: 03/10/2024 Prepared by: Connell Kiss  Exercises -  Seated Hip Adduction Isometrics with Ball  - 1 x daily - 7 x weekly - 2 sets - 10 reps - 5 seconds hold - Supine Quadricep Sets  - 1 x daily - 7 x weekly - 2 sets - 10 reps - 3 seconds hold Use of Motus Nova Foot device for at least 1hour every day  GOALS: Goals reviewed with patient? Yes  SHORT TERM GOALS: Target date: 07/13/2024  Patient will be independent in home exercise program to improve strength/mobility for better functional independence with ADLs.  Baseline: initiated on 03/10/2024 04/14/2024: continued Goal status: IN PROGRESS   LONG TERM GOALS: Target date: 11/16/2024  Patient (< 61 years old) will complete five times sit to stand (5XSTS) test in < 10 seconds indicating an increased LE strength and improved balance.  Baseline: 03/15/2024: 16.53 seconds from standard green chair relying on R hand to push-up from armrest and pt's personal SBQC nearby, but not using it VERSUS 25.85 seconds with R hand across chest, with min A for balance/safety without use of UE support 04/14/2024: 8.96 seconds using R UE support to push-up from armrest and pt's SBQC next to him, 13.75 seconds with R hand across chest with min A for therapist to stabilize chair behind him 05/30/24: 13.01 sec with R UE support on chair; 15.26 sec with R hand across chest and minA from therapist to stabilize chair behind pt 08/10/2024: 12.56 seconds using R UE support on chair armrest & 14.20 seconds with R UE across chest & chair against wall 09/20/24: 23.01 with no UE assistance and one episode of LOB; 18.86 with UE support on SBQC Goal status: IN PROGRESS  2.  Patient will increase Berg Balance score to > 45/56 to demonstrate improved balance and decreased fall risk during functional activities and ADLs.  Baseline: 03/15/2024: 14/56 04/14/2024: 22/56 05/30/24: TBD at next visit 07/27/2024: 33/56 Goal status: IN PROGRESS  3.  Patient will increase 10 meter walk test to >1.62m/s using LRAD as to improve gait speed for better  community ambulation and to reduce fall risk.  Baseline: 0.30 m/s using small based quad cane with CGA and 1x light min A due to balance instability  04/14/2024: 0.405 m/s using SBQC with close SBA for safety 05/30/24: 0.379 m/s using SBQC with close SBA for  safety.  07/21/2024= 0.43 m/s with SBQC with close SBA 09/20/24: 0.40 m/s with SBQC and close SBA, 25.10 sec Goal status: IN PROGRESS  4.  Patient will increase six minute walk test distance to >520ft for progression towards community ambulator and improve gait ability  Baseline: 2103ft with [facility]  04/20/2024:  267ft with SBQC and SBA 05/30/24: 360 ft with [facility] 08/10/2024: 416 feet (126.80 meters, Avg speed 0.66m/s) using SBQC mod-I. 09/20/24: 282' in 3 min 27 sec before needing to sit and terminate the test  Goal status: IN PROGRESS  5.  Patient will reduce timed up and go to <15 seconds to reduce fall risk and demonstrate improved transfer/gait ability. Baseline: 03/15/2024: 25.32 seconds using SBQC and chair with armrest as well as CGA for safety/steadying 04/14/2024: 22.25 seconds using SBQC & chair w/ armrest and close SBA for safety 05/30/24: 26.87 seconds w/ SBQC and use; 07/21/2024= 21.13 sec with SBQC and SBA 07/21/2024:  21.13 sec with SBQC and SBA  09/20/24: 24.7 sec with SBQC and SBA Goal status: IN PROGRESS  6.  Patient will ascend/descend 4 stairs, using railing, independently without loss of balance to improve ability to get in/out of home.   Baseline: 03/31/2024: using R UE support on each HR with skilled min assist for balance using primarily step-to pattern 04/20/2024:  using R UE support on each HR with skilled CGA for safety using step-to pattern 05/30/24: continued use of R UE support on HR with skilled CGA for safety using step-to pattern 08/10/2024: R UE support on HR, step-to pattern, supervision only Goal status: IN PROGRESS  ASSESSMENT:  CLINICAL IMPRESSION:    ***   OBJECTIVE IMPAIRMENTS: Abnormal gait, cardiopulmonary  status limiting activity, decreased activity tolerance, decreased balance, decreased coordination, decreased endurance, decreased knowledge of use of DME, decreased mobility, difficulty walking, decreased ROM, decreased strength, hypomobility, impaired flexibility, impaired sensation, impaired tone, impaired UE functional use, improper body mechanics, postural dysfunction, and pain.   ACTIVITY LIMITATIONS: carrying, lifting, bending, standing, squatting, sleeping, stairs, transfers, bed mobility, continence, bathing, toileting, dressing, reach over head, hygiene/grooming, locomotion level, and caring for others  PARTICIPATION LIMITATIONS: meal prep, cleaning, laundry, medication management, shopping, community activity, and yard work  PERSONAL FACTORS: Age, Past/current experiences, Time since onset of injury/illness/exacerbation, Transportation, and 3+ comorbidities: HTN, hx of kidney stones, depression are also affecting patient's functional outcome.   REHAB POTENTIAL: Good  CLINICAL DECISION MAKING: Evolving/moderate complexity  EVALUATION COMPLEXITY: Moderate  PLAN:  PT FREQUENCY: 1-2x/week  PT DURATION: 12 weeks  PLANNED INTERVENTIONS: [id number]- PT Re-evaluation, 97750- Physical Performance Testing, 97110-Therapeutic exercises, 97530- Therapeutic activity, [id number]- Neuromuscular re-education, 97535- Self Care, [id number]- Manual therapy, [id number]- Gait training, [phone]- Orthotic/Prosthetic subsequent, [phone]- Canalith repositioning, [phone]- Electrical stimulation (manual), Patient/Family education, Balance training, Stair training, Taping, Dry Needling, Joint mobilization, Joint manipulation, Vestibular training, Visual/preceptual remediation/compensation, DME instructions, Cryotherapy, Moist heat, and Biofeedback  PLAN FOR NEXT SESSION:  *** *HIGT on treadmill* Continue +2 A for L wt shift & increase harness support - goal to decrease reliance on R UE support Increasing speed and gradually work  towards adding L LE AW or variable direction walking or no R UE support  Progress HEP  high intensity gait training using +2 HHA vs litegait support vs SBQC continue adding 7lb AW to L LE when appropriate vs decreasing RUE support/reliance while in litegait Backwards gait training - continue Stepping over obstacles - continue Continue stair training for L LE NMR For stance   R foot taps while sustaining L  hip extension For swing phase Reciprocal foot taps L LE stretching and weightbearing for tone management and to improve alignment during gait/standing (avoid excessive hip ER) Non gait training ideas: bridges with R foot placed on unstable surface to promote increased L LE wt bearing      Fonda Simpers, PT, DPT Physical Therapist - [facility]  [facility]  10/24/24, 1:31 PM

## 2024-10-26 ENCOUNTER — Ambulatory Visit: Admitting: Physical Therapy

## 2024-10-26 DIAGNOSIS — I69354 Hemiplegia and hemiparesis following cerebral infarction affecting left non-dominant side: Secondary | ICD-10-CM | POA: Diagnosis present

## 2024-10-26 DIAGNOSIS — M25512 Pain in left shoulder: Secondary | ICD-10-CM | POA: Diagnosis present

## 2024-10-26 DIAGNOSIS — R29898 Other symptoms and signs involving the musculoskeletal system: Secondary | ICD-10-CM | POA: Insufficient documentation

## 2024-10-26 DIAGNOSIS — R29818 Other symptoms and signs involving the nervous system: Secondary | ICD-10-CM | POA: Insufficient documentation

## 2024-10-26 DIAGNOSIS — M6281 Muscle weakness (generalized): Secondary | ICD-10-CM | POA: Diagnosis present

## 2024-10-26 DIAGNOSIS — Z741 Need for assistance with personal care: Secondary | ICD-10-CM | POA: Insufficient documentation

## 2024-10-26 DIAGNOSIS — M24542 Contracture, left hand: Secondary | ICD-10-CM | POA: Diagnosis present

## 2024-10-26 DIAGNOSIS — R2681 Unsteadiness on feet: Secondary | ICD-10-CM | POA: Insufficient documentation

## 2024-10-26 DIAGNOSIS — G8929 Other chronic pain: Secondary | ICD-10-CM | POA: Insufficient documentation

## 2024-10-26 DIAGNOSIS — R2689 Other abnormalities of gait and mobility: Secondary | ICD-10-CM | POA: Insufficient documentation

## 2024-10-26 DIAGNOSIS — R278 Other lack of coordination: Secondary | ICD-10-CM | POA: Diagnosis present

## 2024-10-26 NOTE — Therapy (Signed)
 OUTPATIENT PHYSICAL THERAPY NEURO TREATMENT   Patient Name: Matthew Black MRN: [id number] DOB:1976/02/10, 48 y.o., male Today's Date: 10/26/2024   PCP: Dorina Loving, PA-C REFERRING PROVIDER: Lorilee Sven SQUIBB, MD  END OF SESSION:         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   Stroke (HCC)    RICA, R A1, R MCA occlusion 07/19/19   Past Surgical History:  Procedure Laterality Date   CRANIOPLASTY Right 06/05/2020   Procedure: CRANIOPLASTY;  Surgeon: Matthew Pupa, MD;  Location: [facility] OR;  Service: Neurosurgery;  Laterality: Right;  right   CRANIOTOMY Right 07/19/2019   Procedure: RIGHT HEMI-CRANIECTOMY With implantation of skull flap to abdominal wall;  Surgeon: Matthew Pupa, MD;  Location: [facility] OR;  Service: Neurosurgery;  Laterality: Right;   CYST EXCISION N/A 10/08/2016   Procedure: EXCISION OF POSTERIOR NECK CYST;  Surgeon: Matthew DELENA Freund, MD;  Location: [facility] ORS;  Service: General;  Laterality: N/A;   CYSTOSCOPY/URETEROSCOPY/HOLMIUM LASER/STENT PLACEMENT Right 03/16/2020   Procedure: CYSTOSCOPY RIGHT RETROGRADE PYELOGRAM URETEROSCOPY/HOLMIUM LASER/STENT PLACEMENT;  Surgeon: Matthew Sherwood JONETTA DOUGLAS, MD;  Location: [facility];  Service: Urology;  Laterality: Right;   INCISION AND DRAINAGE ABSCESS N/A 09/22/2014   Procedure: INCISION AND DRAINAGE ABSCESS POSTERIOR NECK;  Surgeon: Donnice KATHEE Lunger, MD;  Location: [facility];  Service: General;  Laterality: N/A;   INCISION AND DRAINAGE ABSCESS N/A 12/20/2015   Procedure: INCISION AND DRAINAGE POSTERIOR NECK MASS;  Surgeon: Matthew Spinner, MD;  Location: [facility];  Service: General;  Laterality: N/A;   INCISION AND DRAINAGE ABSCESS Left 07/10/2004   middle finger   IR IVC FILTER PLMT / S&I /IMG GUID/MOD SED  07/21/2019   IR RADIOLOGIST EVAL & MGMT  12/14/2019   IR RADIOLOGIST EVAL & MGMT  12/21/2020   IR VENOGRAM RENAL UNI RIGHT  07/21/2019   MASS EXCISION N/A 07/21/2017   Procedure: EXCISION OF BENIGN NECK LESION WITH LAYERED CLOSURE;  Surgeon: Matthew Filippo, MD;  Location: [facility];  Service: Plastics;  Laterality: N/A;   MASS EXCISION N/A 11/10/2017   Procedure: EXCISION BENIGN LESION OF THE NECK WITH LAYERED CLOSURE;  Surgeon: Matthew Filippo, MD;  Location: [facility];  Service: Plastics;  Laterality: N/A;   Patient Active Problem List   Diagnosis Date Noted   Hx of adenomatous colonic polyps 06/01/2023   COVID-19 virus infection 04/21/2021   Left-sided weakness 04/21/2021   S/P craniotomy 06/05/2020   History of cranioplasty 06/05/2020   Peri-rectal abscess 02/14/2020   Abnormal CT scan, pelvis 02/14/2020   Pancytopenia (HCC) 12/02/2019   Nephrolithiasis 12/02/2019   Hydronephrosis with renal and ureteral calculus obstruction 12/02/2019   Rectal pain 12/02/2019   Hyperkalemia 12/02/2019   Reactive depression    Wound infection after surgery    Sleep disturbance    Dysphagia, post-stroke    Transaminitis    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   Cerebral abscess    Urinary tract infection without hematuria    Altered mental status  Primary hypercoagulable state    Acute pulmonary embolism without acute cor pulmonale (HCC)    Deep vein thrombosis (DVT) of non-extremity vein    Hypokalemia    Acute blood loss anemia    Leukocytosis    Endotracheal tube present    Acute respiratory failure with hypoxemia (HCC)    Stroke (cerebrum) (HCC) 07/19/2019   Pressure injury of skin 07/19/2019   Acute CVA (cerebrovascular accident) [facility])    Encephalopathy     Dysphagia    Acute encephalopathy    Essential hypertension    Obesity 03/12/2016   Scalp abscess 12/20/2015   Neck abscess 12/20/2015   Pilonidal cyst 02/08/2013    ONSET DATE: stroke in August 2020  REFERRING DIAG: I63.9 (ICD-10-CM) - Cerebrovascular accident (CVA), unspecified mechanism (HCC)   THERAPY DIAG:    No diagnosis found.   Rationale for Evaluation and Treatment: Rehabilitation  SUBJECTIVE:                                                                                                                                                                                             SUBJECTIVE STATEMENT:   Pt reports doing okay with no further falls. Pt had a good thanksgiving with lots of good food.    Of note: Botox  in L LE biceps femoris on 06/24/2024 Has the Motus Nova Foot device    Initial Eval: Patient reports his goal is to walk without using a cane and without assistance. Patient states he wants to walk like he did before having the stroke. Reports his stroke was in August of 2020. States he currently uses a quad cane for ambulation and tries to walk as much as possible. Reports he has a manual wheelchair, but states he tries not to use it because it depresses him and makes him feel like he is back to when he first had his stroke. Reports wearing L LE custom AFO at all times.   Reports he is currently doing everything mod-I, but states he could call his mom, dad, or brothers if he needed assistance.  Reports he has routine botox  injections in L UE to help manage tone, but doesn't receive any in his LE.  Reports he has a hospital bed, but sleeps in a regular bed.  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  Etiology of carotid occlusion unclear possibly dissection.  He had a prolonged hospital admission with several complications including DVT, pulmonary embolism, hemorrhagic transformation, abdominal wall hematoma, UTI but made quite remarkable recovery   PAIN:  Are you having pain? No, denies pain today, but states sometimes will have L shoulder pain  PRECAUTIONS: Fall  RED FLAGS: None   WEIGHT BEARING RESTRICTIONS: No  FALLS: Has patient fallen in last 6 months? Yes, stating he fell down his stairs ~5 months ago and is now scared to perform stair navigation  LIVING ENVIRONMENT: Lives with: lives alone Lives in: House/apartment Stairs: Yes: Internal: flight steps; can reach both and External: 4 steps; can reach both, but states he doesn't have to go upstairs Has following equipment at home: Quad cane small base, Hemi walker, Wheelchair (manual), shower chair, Shower bench, and L LE custom AFO Pt has custom K5 wheelchair with rigid, contoured back support and Roho cushion. Pt reports he calls [employer] to come adjust his seat cushion when he feels it is getting uncomfortable.  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  DIAGNOSTIC FINDINGS:  EXAM: CT ANGIOGRAPHY HEAD AND NECK WITH AND WITHOUT CONTRAST IMPRESSION: 1. No acute intracranial abnormality. 2. Chronically  occluded right ICA with minimal opacification of the right MCA and ACA branches. 3. Redemonstrated extensive encephalomalacia in the right MCA and ACA territories secondary to a prior infarct.  Electronically Signed   By: Lyndall Gore M.D.   On: 11/27/2023 13:45  COGNITION: Overall cognitive status: Within functional limits for tasks assessed  VISION (03/29/2024): Therapist performed visual field screen with pt appearing to have L homonomous hemianopsia - pt confirms he has loss of vision on L side.   SENSATION: Light touch: Impaired in L LE Proprioception: Impaired  in L LE  Can feel deep pressure in L LE   COORDINATION: Impaired in L LE due to paresis, hypertonia, and overall decreased flexibility  EDEMA:  Not formally assessed  MUSCLE TONE: LLE: Mild, Moderate, and Hypertonic  MUSCLE LENGTH: Not formally assessed; however, demonstrates L hamstring muscle tightness  DTRs:  Not formally assessed  POSTURE: rounded shoulders, forward head, and weight shift right  LOWER EXTREMITY ROM:       Right Eval Active ROM Left Eval Passive ROM  Hip flexion WFL Achieves at least 90  Hip extension    Hip abduction    Hip adduction    Hip internal rotation  Unable to achieve neutral hip rotation, lacking internal rotation  Hip external rotation  [facility] and rests in excessive ER  Knee flexion [facility]  Knee extension WFL Able to achieve terminal knee extension in supine, but with significant discomfort due to hamstring tone  Ankle dorsiflexion WFL Did not remove AFO to assess this date  Ankle plantarflexion WFL Did not remove AFO to assess this date  Ankle inversion    Ankle eversion     (Blank rows = not tested)  LOWER EXTREMITY MMT:    MMT Right Eval Left Eval  Hip flexion 4+ 2-  (compensates  with hip adductor activation)  Hip extension    Hip abduction    Hip adduction  3+  Hip internal rotation  2-  Hip external rotation  2  Knee flexion 4+ 1  Knee extension 4+ 3+   Ankle dorsiflexion 4+ Did not remove AFO to assess this date  Ankle plantarflexion 4+ Did not remove AFO to assess this date  Ankle inversion    Ankle eversion    (Blank rows = not tested)  Manual Muscle Test Scale 0/5 = No muscle contraction can be seen or felt 1/5 = Contraction can be felt, but there is no motion 2-/5 = Part moves through incomplete ROM w/ gravity decreased 2/5 = Part moves through complete ROM w/ gravity decreased 2+/5 = Part moves through incomplete ROM (<50%) against gravity or through complete ROM w/ gravity 3-/5 = Part moves through incomplete ROM (>50%) against gravity 3/5 = Part moves through complete ROM against gravity 3+/5 = Part moves through complete ROM against gravity/slight resistance 4-/5= Holds test position against slight to moderate pressure 4/5 = Part moves through complete ROM against gravity/moderate resistance 4+/5= Holds test position against moderate to strong pressure 5/5 = Part moves through complete ROM against gravity/full resistance  BED MOBILITY: (sleeps in regular bed, but has hospital bed available) Findings: Sit to supine CGA Supine to sit Min A and Mod A Rolling to Right Mod A Rolling to Left Min A and Mod A  TRANSFERS: Sit to stand: CGA and Min A  Assistive device utilized: Counselling psychologist     Stand to sit: CGA and Min A  Assistive device utilized: Tree Surgeon to chair: Min A  Assistive device utilized: Counselling psychologist       RAMP:  Not tested  CURB:  Not tested  STAIRS: Not tested *need to assess*  GAIT: Findings: advances L LE with excessive hip external rotation using hip adductors, step to pattern, decreased arm swing- Left, decreased step length- Right, decreased stance time- Left, decreased stride length, decreased hip/knee flexion- Left, decreased ankle dorsiflexion- Left, lateral lean- Right, and decreased trunk rotation Distance walked: ~65ft Assistive device utilized: small based  quad cane in R UE  Level of assistance: CGA and Min A  FUNCTIONAL TESTS:  5 times sit to stand: need to assess Timed up and go (TUG): need to assess 6 minute walk test: need to assess 10 meter walk test: 0.49m/s using small based quad cane and CGA Berg Balance Scale: need to assess  PATIENT SURVEYS:  Stroke Impact Scale 03/15/2024: 49/80                                                                                                                               TREATMENT DATE: 10/26/2024   Seated BP to end session 128/89 HR:   Physical therapy treatment session today consisted of completing assessment of goals and administration  of testing as demonstrated and documented in flow sheet, treatment, and goals section of this note. Addition treatments may be found below.   Physical Performance Test or Measurement: a  physical performance test(s) or measurement (eg,  musculoskeletal, functional capacity), with written report,  each 15 mins   6 Min Walk Test:  Instructed patient to ambulate as quickly and as safely as possible for 6 minutes using LRAD. Patient was allowed to take standing rest breaks without stopping the test, but if the patient required a sitting rest break the clock would be stopped and the test would be over.  Results: 314 feet using a SBQC with SBA. Results indicate that the patient has reduced endurance with ambulation compared to age matched norms.  Age Matched Norms (in meters): 68-69 yo M: 16 F: 37, 47-79 yo M: 32 F: 471, 56-89 yo M: 417 F: 392 MDC: 58.21 meters (190.98 feet) or 50 meters (ANPTA Core Set of Outcome Measures for Adults with Neurologic Conditions, 2018)  10 Meter Walk Test: Patient instructed to walk 10 meters (32.8 ft) as quickly and as safely as possible at their normal speed Results: .35 m/s (29 seconds with SBQC)  Cut off scores:   Household Ambulator  < 0.4 m/s  Limited Community Ambulator  0.4 - 0.8 m/s  [employer]  > 0.8  m/s  Increased fall risk  < 1.22m/s  Crossing a Street  >1.97m/s  MCID 0.05 m/s (small), 0.13 m/s (moderate), 0.06 m/s (significant)  (ANPTA Core Set of Outcome Measures for Adults with Neurologic Conditions, 2018)     PT instructed pt in TUG: 18.86 sec ( >13.5 sec indicates increased fall risk)  TA- To improve functional movements patterns for everyday tasks   Pt ascends and descends stairs x 4 steps with step to pattern and R UE assist. Pt manages LLE well and is very smooth with stepping down.   Pt tasked with sidestepping without UE assist in // bars x 2 laps. Pt first lap used UE assist lightly, difficulty with L sided weight shifting. Second round after brief seated rest due to pt fatigue from first round pt able to complete without UE assist and with improved LLE SLS time and weight shift  *1 lap = 1 time each way to return to start point    PATIENT EDUCATION: Education details: Therapy POC, LTGs, HEP, importance of improving L LE flexibility, findings during assessment  Person educated: Patient Education method: Explanation and Handouts Education comprehension: verbalized understanding and needs further education  HOME EXERCISE PROGRAM:  Access Code: [email or web address] URL: [email or web address] Date: 03/10/2024 Prepared by: Connell Kiss  Exercises - Seated Hip Adduction Isometrics with Ball  - 1 x daily - 7 x weekly - 2 sets - 10 reps - 5 seconds hold - Supine Quadricep Sets  - 1 x daily - 7 x weekly - 2 sets - 10 reps - 3 seconds hold Use of Motus Nova Foot device for at least 1hour every day  GOALS: Goals reviewed with patient? Yes  SHORT TERM GOALS: Target date: 07/13/2024  Patient will be independent in home exercise program to improve strength/mobility for better functional independence with ADLs.  Baseline: initiated on 03/10/2024 04/14/2024: continued Goal status: IN PROGRESS   LONG TERM GOALS: Target date: 11/16/2024  Patient (< 75 years old) will  complete five times sit to stand (5XSTS) test in < 10 seconds indicating an increased LE strength and improved balance.  Baseline: 03/15/2024: 16.53 seconds from standard green chair  relying on R hand to push-up from armrest and pt's personal SBQC nearby, but not using it VERSUS 25.85 seconds with R hand across chest, with min A for balance/safety without use of UE support 04/14/2024: 8.96 seconds using R UE support to push-up from armrest and pt's SBQC next to him, 13.75 seconds with R hand across chest with min A for therapist to stabilize chair behind him 05/30/24: 13.01 sec with R UE support on chair; 15.26 sec with R hand across chest and minA from therapist to stabilize chair behind pt 08/10/2024: 12.56 seconds using R UE support on chair armrest & 14.20 seconds with R UE across chest & chair against wall 09/20/24: 23.01 with no UE assistance and one episode of LOB; 18.86 with UE support on SBQC Goal status: IN PROGRESS  2.  Patient will increase Berg Balance score to > 45/56 to demonstrate improved balance and decreased fall risk during functional activities and ADLs.  Baseline: 03/15/2024: 14/56 04/14/2024: 22/56 05/30/24: TBD at next visit 07/27/2024: 33/56 Goal status: IN PROGRESS  3.  Patient will increase 10 meter walk test to >1.12m/s using LRAD as to improve gait speed for better community ambulation and to reduce fall risk.  Baseline: 0.30 m/s using small based quad cane with CGA and 1x light min A due to balance instability  04/14/2024: 0.405 m/s using SBQC with close SBA for safety 05/30/24: 0.379 m/s using SBQC with close SBA for safety.  07/21/2024= 0.43 m/s with SBQC with close SBA 09/20/24: 0.40 m/s with SBQC and close SBA, 25.10 sec 12/3: .35 m/s with SBQC Goal status: IN PROGRESS  4.  Patient will increase six minute walk test distance to >539ft for progression towards community ambulator and improve gait ability  Baseline: 286ft with [facility]  04/20/2024:  245ft with SBQC and  SBA 05/30/24: 360 ft with [facility] 08/10/2024: 416 feet (126.80 meters, Avg speed 0.19m/s) using SBQC mod-I. 09/20/24: 282' in 3 min 27 sec before needing to sit and terminate the test  12/3:314 ft with [facility] finished entirety of 6 minutes Goal status: IN PROGRESS  5.  Patient will reduce timed up and go to <15 seconds to reduce fall risk and demonstrate improved transfer/gait ability. Baseline: 03/15/2024: 25.32 seconds using SBQC and chair with armrest as well as CGA for safety/steadying 04/14/2024: 22.25 seconds using SBQC & chair w/ armrest and close SBA for safety 05/30/24: 26.87 seconds w/ SBQC and use; 07/21/2024= 21.13 sec with SBQC and SBA 07/21/2024:  21.13 sec with SBQC and SBA  09/20/24: 24.7 sec with SBQC and SBA 12/3: 24.6 sec SBQC Goal status: IN PROGRESS  6.  Patient will ascend/descend 4 stairs, using railing, independently without loss of balance to improve ability to get in/out of home.   Baseline: 03/31/2024: using R UE support on each HR with skilled min assist for balance using primarily step-to pattern 04/20/2024:  using R UE support on each HR with skilled CGA for safety using step-to pattern 05/30/24: continued use of R UE support on HR with skilled CGA for safety using step-to pattern 08/10/2024: R UE support on HR, step-to pattern, supervision only 10/26/24:Asesnds and descends independently using R UE for support both directions.  Goal status: MET  ASSESSMENT:  CLINICAL IMPRESSION:    Pt presents for update of goals with new authorization for visits pending. Pt shows great progress with functional goal of ascending and descending stairs completing this independently and with good safety awareness. Pt still getting very fatigue with gait and has trouble  with LLE weight shifting which limits speed and efficiency of gait. Pt has made steady and consistent progress during PT interventions and PT is recommending further intervention to improve upon his deficits. Pt eager to trial  interventions at home in safe environment and asks questions during interventions to ascertain ways of safe completion for improved carryover. Of note, pt did have one fall recently that severely impacted his confidence with mobility. Patient's condition has the potential to improve in response to therapy. Maximum improvement is yet to be obtained. The anticipated improvement is attainable and reasonable in a generally predictable time. Pt will continue to benefit from skilled physical therapy intervention to address impairments, improve QOL, and attain therapy goals.     OBJECTIVE IMPAIRMENTS: Abnormal gait, cardiopulmonary status limiting activity, decreased activity tolerance, decreased balance, decreased coordination, decreased endurance, decreased knowledge of use of DME, decreased mobility, difficulty walking, decreased ROM, decreased strength, hypomobility, impaired flexibility, impaired sensation, impaired tone, impaired UE functional use, improper body mechanics, postural dysfunction, and pain.   ACTIVITY LIMITATIONS: carrying, lifting, bending, standing, squatting, sleeping, stairs, transfers, bed mobility, continence, bathing, toileting, dressing, reach over head, hygiene/grooming, locomotion level, and caring for others  PARTICIPATION LIMITATIONS: meal prep, cleaning, laundry, medication management, shopping, community activity, and yard work  PERSONAL FACTORS: Age, Past/current experiences, Time since onset of injury/illness/exacerbation, Transportation, and 3+ comorbidities: HTN, hx of kidney stones, depression are also affecting patient's functional outcome.   REHAB POTENTIAL: Good  CLINICAL DECISION MAKING: Evolving/moderate complexity  EVALUATION COMPLEXITY: Moderate  PLAN:  PT FREQUENCY: 1-2x/week  PT DURATION: 12 weeks  PLANNED INTERVENTIONS: [id number]- PT Re-evaluation, 97750- Physical Performance Testing, 97110-Therapeutic exercises, 97530- Therapeutic activity, [id number]-  Neuromuscular re-education, 97535- Self Care, [id number]- Manual therapy, [id number]- Gait training, [id number]- Orthotic/Prosthetic subsequent, [phone]- Canalith repositioning, [phone]- Electrical stimulation (manual), Patient/Family education, Balance training, Stair training, Taping, Dry Needling, Joint mobilization, Joint manipulation, Vestibular training, Visual/preceptual remediation/compensation, DME instructions, Cryotherapy, Moist heat, and Biofeedback  PLAN FOR NEXT SESSION:   *HIGT on treadmill* Continue +2 A for L wt shift & increase harness support - goal to decrease reliance on R UE support Increasing speed and gradually work towards adding L LE AW or variable direction walking or no R UE support  Progress HEP  high intensity gait training using +2 HHA vs litegait support vs SBQC continue adding 7lb AW to L LE when appropriate vs decreasing RUE support/reliance while in litegait Backwards gait training - continue Stepping over obstacles - continue Continue stair training for L LE NMR For stance   R foot taps while sustaining L hip extension For swing phase Reciprocal foot taps L LE stretching and weightbearing for tone management and to improve alignment during gait/standing (avoid excessive hip ER) Non gait training ideas: bridges with R foot placed on unstable surface to promote increased L LE wt bearing      Lonni KATHEE Gainer PT  Physical Therapist- [facility]  [facility]    10/26/24, 10:25 AM

## 2024-11-01 ENCOUNTER — Ambulatory Visit: Admitting: Physical Therapy

## 2024-11-01 ENCOUNTER — Ambulatory Visit

## 2024-11-01 DIAGNOSIS — R29898 Other symptoms and signs involving the musculoskeletal system: Secondary | ICD-10-CM

## 2024-11-01 DIAGNOSIS — R2681 Unsteadiness on feet: Secondary | ICD-10-CM | POA: Diagnosis not present

## 2024-11-01 DIAGNOSIS — R2689 Other abnormalities of gait and mobility: Secondary | ICD-10-CM

## 2024-11-01 DIAGNOSIS — I69354 Hemiplegia and hemiparesis following cerebral infarction affecting left non-dominant side: Secondary | ICD-10-CM

## 2024-11-01 DIAGNOSIS — M6281 Muscle weakness (generalized): Secondary | ICD-10-CM

## 2024-11-01 NOTE — Therapy (Signed)
 OUTPATIENT PHYSICAL THERAPY NEURO TREATMENT   Patient Name: Matthew Black MRN: [id number] DOB:March 02, 1976, 48 y.o., male Today's Date: 11/01/2024   PCP: Dorina Loving, PA-C REFERRING PROVIDER: Lorilee Sven SQUIBB, MD  END OF SESSION:    PT End of Session - 11/01/24 1416     Visit Number 45    Number of Visits 59    Date for Recertification  11/16/24    Authorization Type [employer] auth #: [id number] for 8 PT vst from 11/10-12/8    Authorization - Number of Visits 8    Progress Note Due on Visit 50    PT Start Time 1445    PT Stop Time 1527    PT Time Calculation (min) 42 min    Equipment Utilized During Treatment Gait belt;Other (comment)   L LE AFO   Activity Tolerance Patient tolerated treatment well    Behavior During Therapy [facility] for tasks assessed/performed              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   Stroke (HCC)    RICA, R A1, R MCA occlusion 07/19/19   Past Surgical History:  Procedure Laterality Date   CRANIOPLASTY Right 06/05/2020   Procedure: CRANIOPLASTY;  Surgeon: Lanis Pupa, MD;  Location: [facility] OR;  Service: Neurosurgery;  Laterality: Right;  right   CRANIOTOMY Right 07/19/2019   Procedure: RIGHT HEMI-CRANIECTOMY With implantation of skull flap to abdominal wall;  Surgeon: Lanis Pupa, MD;  Location: [facility] OR;  Service: Neurosurgery;  Laterality: Right;   CYST EXCISION N/A 10/08/2016   Procedure: EXCISION OF POSTERIOR NECK CYST;  Surgeon: Mitzie DELENA Freund, MD;  Location: [facility] ORS;  Service: General;  Laterality: N/A;    CYSTOSCOPY/URETEROSCOPY/HOLMIUM LASER/STENT PLACEMENT Right 03/16/2020   Procedure: CYSTOSCOPY RIGHT RETROGRADE PYELOGRAM URETEROSCOPY/HOLMIUM LASER/STENT PLACEMENT;  Surgeon: Carolee Sherwood JONETTA DOUGLAS, MD;  Location: [facility] ORS;  Service: Urology;  Laterality: Right;   INCISION AND DRAINAGE ABSCESS N/A 09/22/2014   Procedure: INCISION AND DRAINAGE ABSCESS POSTERIOR NECK;  Surgeon: Donnice KATHEE Lunger, MD;  Location: [facility] ORS;  Service: General;  Laterality: N/A;   INCISION AND DRAINAGE ABSCESS N/A 12/20/2015   Procedure: INCISION AND DRAINAGE POSTERIOR NECK MASS;  Surgeon: Krystal Spinner, MD;  Location: [facility] ORS;  Service: General;  Laterality: N/A;   INCISION AND DRAINAGE ABSCESS Left 07/10/2004   middle finger   IR IVC FILTER PLMT / S&I /IMG GUID/MOD SED  07/21/2019   IR RADIOLOGIST EVAL & MGMT  12/14/2019   IR RADIOLOGIST EVAL & MGMT  12/21/2020   IR VENOGRAM RENAL UNI RIGHT  07/21/2019   MASS EXCISION N/A 07/21/2017   Procedure: EXCISION OF BENIGN NECK LESION WITH LAYERED CLOSURE;  Surgeon: Arelia Filippo, MD;  Location: [facility];  Service: Plastics;  Laterality: N/A;   MASS EXCISION N/A 11/10/2017   Procedure: EXCISION BENIGN LESION OF THE NECK WITH LAYERED CLOSURE;  Surgeon: Arelia Filippo, MD;  Location: [facility];  Service: Plastics;  Laterality: N/A;   Patient Active Problem List   Diagnosis  Date Noted   Hx of adenomatous colonic polyps 06/01/2023   COVID-19 virus infection 04/21/2021   Left-sided weakness 04/21/2021   S/P craniotomy 06/05/2020   History of cranioplasty 06/05/2020   Peri-rectal abscess 02/14/2020   Abnormal CT scan, pelvis 02/14/2020   Pancytopenia (HCC) 12/02/2019   Nephrolithiasis 12/02/2019   Hydronephrosis with renal and ureteral calculus obstruction 12/02/2019   Rectal pain 12/02/2019   Hyperkalemia 12/02/2019   Reactive depression    Wound infection after surgery    Sleep disturbance    Dysphagia, post-stroke    Transaminitis    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   Cerebral abscess    Urinary tract infection without hematuria    Altered mental status    Primary hypercoagulable state    Acute pulmonary embolism without acute cor pulmonale (HCC)    Deep vein thrombosis (DVT) of non-extremity vein    Hypokalemia    Acute blood loss anemia    Leukocytosis    Endotracheal tube present    Acute respiratory failure with hypoxemia (HCC)    Stroke (cerebrum) (HCC) 07/19/2019   Pressure injury of skin 07/19/2019   Acute CVA (cerebrovascular accident) (HCC)    Encephalopathy    Dysphagia    Acute encephalopathy    Essential hypertension    Obesity 03/12/2016   Scalp abscess 12/20/2015   Neck abscess 12/20/2015   Pilonidal cyst 02/08/2013    ONSET DATE: stroke in August 2020  REFERRING DIAG: I63.9 (ICD-10-CM) - Cerebrovascular accident (CVA), unspecified mechanism (HCC)   THERAPY DIAG:    Other abnormalities of gait and mobility  Hemiplegia and hemiparesis following cerebral infarction affecting left non-dominant side (HCC)  Other symptoms and signs involving the musculoskeletal system  Muscle weakness (generalized)  Unsteadiness on feet   Rationale for Evaluation and Treatment: Rehabilitation  SUBJECTIVE:                                                                                                                                                                                             SUBJECTIVE STATEMENT:   Pt reports doing okay with no further falls. Is getting ready for holidays as he will have company for Christmas.  Mentioned opportunity for [facility] clinic since he did not get approved for too many more visits and told him he could think about this over the next few sessions.  Of note: Botox  in L LE biceps femoris on 06/24/2024 Has the Motus Nova Foot device    Initial Eval: Patient reports his goal is to walk without using a cane and without assistance. Patient states he wants to  walk  like he did before having the stroke. Reports his stroke was in August of 2020. States he currently uses a quad cane for ambulation and tries to walk as much as possible. Reports he has a manual wheelchair, but states he tries not to use it because it depresses him and makes him feel like he is back to when he first had his stroke. Reports wearing L LE custom AFO at all times.   Reports he is currently doing everything mod-I, but states he could call his mom, dad, or brothers if he needed assistance.  Reports he has routine botox  injections in L UE to help manage tone, but doesn't receive any in his LE.  Reports he has a hospital bed, but sleeps in a regular bed.  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  Etiology of carotid occlusion unclear possibly dissection.  He had a prolonged hospital admission with several complications including DVT, pulmonary embolism, hemorrhagic transformation, abdominal wall hematoma, UTI but made quite remarkable recovery   PAIN:  Are you having pain? No, denies pain today, but states sometimes will have L shoulder pain  PRECAUTIONS: Fall  RED FLAGS: None   WEIGHT BEARING RESTRICTIONS: No  FALLS: Has patient fallen in last 6 months? Yes, stating he fell down his stairs ~5 months ago and is now scared to perform stair navigation  LIVING ENVIRONMENT: Lives with: lives alone Lives in: House/apartment Stairs: Yes: Internal: flight steps; can reach both and External: 4 steps; can reach both, but states he doesn't have to go  upstairs Has following equipment at home: Quad cane small base, Hemi walker, Wheelchair (manual), shower chair, Shower bench, and L LE custom AFO Pt has custom K5 wheelchair with rigid, contoured back support and Roho cushion. Pt reports he calls [employer] to come adjust his seat cushion when he feels it is getting uncomfortable.  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  DIAGNOSTIC FINDINGS:  EXAM: CT ANGIOGRAPHY HEAD AND NECK WITH AND WITHOUT CONTRAST IMPRESSION: 1. No acute intracranial abnormality. 2. Chronically occluded right ICA with minimal opacification of the right MCA and ACA branches. 3. Redemonstrated extensive encephalomalacia in the right MCA and ACA territories secondary to a prior infarct.  Electronically Signed   By: Lyndall Gore M.D.   On: 11/27/2023 13:45  COGNITION: Overall cognitive status: Within functional limits for tasks assessed  VISION (03/29/2024): Therapist performed visual field screen with pt appearing to have L homonomous hemianopsia - pt confirms he has loss of vision on L side.   SENSATION: Light touch: Impaired in L LE Proprioception: Impaired  in L LE  Can feel deep pressure in L LE   COORDINATION: Impaired in L LE due to paresis, hypertonia, and overall decreased flexibility  EDEMA:  Not formally assessed  MUSCLE TONE: LLE: Mild, Moderate, and Hypertonic  MUSCLE LENGTH: Not formally assessed; however, demonstrates L hamstring muscle tightness  DTRs:  Not formally assessed  POSTURE:  rounded shoulders, forward head, and weight shift right  LOWER EXTREMITY ROM:       Right Eval Active ROM Left Eval Passive ROM  Hip flexion WFL Achieves at least 90  Hip extension    Hip abduction    Hip adduction    Hip internal rotation  Unable to achieve neutral hip rotation, lacking internal rotation  Hip external rotation  [facility] and rests in excessive ER  Knee flexion [facility]  Knee extension WFL Able to achieve terminal knee extension in supine, but with significant discomfort due to hamstring tone  Ankle dorsiflexion WFL Did not remove AFO to assess this date  Ankle plantarflexion WFL Did not remove AFO to assess this date  Ankle inversion    Ankle eversion     (Blank rows = not tested)  LOWER EXTREMITY MMT:    MMT Right Eval Left Eval  Hip flexion 4+ 2-  (compensates with hip adductor activation)  Hip extension    Hip abduction    Hip adduction  3+  Hip internal rotation  2-  Hip external rotation  2  Knee flexion 4+ 1  Knee extension 4+ 3+  Ankle dorsiflexion 4+ Did not remove AFO to assess this date  Ankle plantarflexion 4+ Did not remove AFO to assess this date  Ankle inversion    Ankle eversion    (Blank rows = not tested)  Manual Muscle Test Scale 0/5 = No muscle contraction can be seen or felt 1/5 = Contraction can be felt, but there is no motion 2-/5 = Part moves through incomplete ROM w/ gravity decreased 2/5 = Part moves through complete ROM w/ gravity decreased 2+/5 = Part moves through incomplete ROM (<50%) against gravity or through complete ROM w/ gravity 3-/5 = Part moves through incomplete ROM (>50%) against gravity 3/5 = Part moves through complete ROM against gravity 3+/5 = Part moves through complete ROM against gravity/slight resistance 4-/5= Holds test position against slight to moderate pressure 4/5 = Part moves through complete ROM against gravity/moderate resistance 4+/5= Holds test position against moderate to strong pressure 5/5 =  Part moves through complete ROM against gravity/full resistance  BED MOBILITY: (sleeps in regular bed, but has hospital bed available) Findings: Sit to supine CGA Supine to sit Min A and Mod A Rolling to Right Mod A Rolling to Left Min A and Mod A  TRANSFERS: Sit to stand: CGA and Min A  Assistive device utilized: Counselling psychologist     Stand to sit: CGA and Min A  Assistive device utilized: Tree Surgeon to chair: Min A  Assistive device utilized: Counselling psychologist       RAMP:  Not tested  CURB:  Not tested  STAIRS: Not tested *need to assess*  GAIT: Findings: advances L LE with excessive hip external rotation using hip adductors, step to pattern, decreased arm swing- Left, decreased step length- Right, decreased stance time- Left, decreased stride length, decreased hip/knee flexion- Left, decreased ankle dorsiflexion- Left, lateral lean- Right, and decreased trunk rotation Distance walked: ~32ft Assistive device utilized: small based quad cane in R UE  Level of assistance: CGA and Min A  FUNCTIONAL TESTS:  5 times sit to stand: need to assess Timed up and go (TUG):  need to assess 6 minute walk test: need to assess 10 meter walk test: 0.54m/s using small based quad cane and CGA Berg Balance Scale: need to assess  PATIENT SURVEYS:  Stroke Impact Scale 03/15/2024: 49/80                                                                                                                               TREATMENT DATE: 11/01/2024    TA- To improve functional movements patterns for everyday tasks   Gait from waiting area in clinic to treatment area   Several attempts to don lite gait harness but pt not comfortable in harness. Will try large harness next session instead of adult, although pt should be okay in adult harness based on his body size and recommendations from training course.    Sidestepping in // bars x 2 rounds - cues for min UE support to promote  dynamic weight shifting - no UE support when stepping to left, but does use when stepping to right but instructed to keep support light.  Rest Sidestepping in // bars x 2 rounds  cues for min UE support to promote dynamic weight shifting - no UE support when stepping to left, but does use when stepping to right but instructed to keep support light.  Rest Step tap with UE support 2 x 10 reps, cues for LLE WB with R LE step tap to improve weight shift and challenge LLE stability  Rest Shopping cart style gait with transport chair x 80 ft, unable to put a lot of weight due to chair type. Cues for increased LLE step length Pt required occasional rest breaks due fatigue, PT was attentive to when pt appeared to be tired or winded in order to prevent excessive fatigue.    PATIENT EDUCATION: Education details: Therapy POC, LTGs, HEP, importance of improving L LE flexibility, findings during assessment  Person educated: Patient Education method: Explanation and Handouts Education comprehension: verbalized understanding and needs further education  HOME EXERCISE PROGRAM:  Access Code: [email or web address] URL: [email or web address] Date: 03/10/2024 Prepared by: Connell Kiss  Exercises - Seated Hip Adduction Isometrics with Ball  - 1 x daily - 7 x weekly - 2 sets - 10 reps - 5 seconds hold - Supine Quadricep Sets  - 1 x daily - 7 x weekly - 2 sets - 10 reps - 3 seconds hold Use of Motus Nova Foot device for at least 1hour every day  GOALS: Goals reviewed with patient? Yes  SHORT TERM GOALS: Target date: 07/13/2024  Patient will be independent in home exercise program to improve strength/mobility for better functional independence with ADLs.  Baseline: initiated on 03/10/2024 04/14/2024: continued Goal status: IN PROGRESS   LONG TERM GOALS: Target date: 11/16/2024  Patient (< 39 years old) will complete five times sit to stand (5XSTS) test in < 10 seconds indicating an increased LE  strength and improved balance.  Baseline: 03/15/2024: 16.53 seconds from standard green  chair relying on R hand to push-up from armrest and pt's personal SBQC nearby, but not using it VERSUS 25.85 seconds with R hand across chest, with min A for balance/safety without use of UE support 04/14/2024: 8.96 seconds using R UE support to push-up from armrest and pt's SBQC next to him, 13.75 seconds with R hand across chest with min A for therapist to stabilize chair behind him 05/30/24: 13.01 sec with R UE support on chair; 15.26 sec with R hand across chest and minA from therapist to stabilize chair behind pt 08/10/2024: 12.56 seconds using R UE support on chair armrest & 14.20 seconds with R UE across chest & chair against wall 09/20/24: 23.01 with no UE assistance and one episode of LOB; 18.86 with UE support on SBQC Goal status: IN PROGRESS  2.  Patient will increase Berg Balance score to > 45/56 to demonstrate improved balance and decreased fall risk during functional activities and ADLs.  Baseline: 03/15/2024: 14/56 04/14/2024: 22/56 05/30/24: TBD at next visit 07/27/2024: 33/56 Goal status: IN PROGRESS  3.  Patient will increase 10 meter walk test to >1.66m/s using LRAD as to improve gait speed for better community ambulation and to reduce fall risk.  Baseline: 0.30 m/s using small based quad cane with CGA and 1x light min A due to balance instability  04/14/2024: 0.405 m/s using SBQC with close SBA for safety 05/30/24: 0.379 m/s using SBQC with close SBA for safety.  07/21/2024= 0.43 m/s with SBQC with close SBA 09/20/24: 0.40 m/s with SBQC and close SBA, 25.10 sec 12/3: .35 m/s with SBQC Goal status: IN PROGRESS  4.  Patient will increase six minute walk test distance to >583ft for progression towards community ambulator and improve gait ability  Baseline: 240ft with [facility]  04/20/2024:  266ft with SBQC and SBA 05/30/24: 360 ft with [facility] 08/10/2024: 416 feet (126.80 meters, Avg speed 0.106m/s) using SBQC  mod-I. 09/20/24: 282' in 3 min 27 sec before needing to sit and terminate the test  12/3:314 ft with [facility] finished entirety of 6 minutes Goal status: IN PROGRESS  5.  Patient will reduce timed up and go to <15 seconds to reduce fall risk and demonstrate improved transfer/gait ability. Baseline: 03/15/2024: 25.32 seconds using SBQC and chair with armrest as well as CGA for safety/steadying 04/14/2024: 22.25 seconds using SBQC & chair w/ armrest and close SBA for safety 05/30/24: 26.87 seconds w/ SBQC and use; 07/21/2024= 21.13 sec with SBQC and SBA 07/21/2024:  21.13 sec with SBQC and SBA  09/20/24: 24.7 sec with SBQC and SBA 12/3: 24.6 sec SBQC Goal status: IN PROGRESS  6.  Patient will ascend/descend 4 stairs, using railing, independently without loss of balance to improve ability to get in/out of home.   Baseline: 03/31/2024: using R UE support on each HR with skilled min assist for balance using primarily step-to pattern 04/20/2024:  using R UE support on each HR with skilled CGA for safety using step-to pattern 05/30/24: continued use of R UE support on HR with skilled CGA for safety using step-to pattern 08/10/2024: R UE support on HR, step-to pattern, supervision only 10/26/24:Asesnds and descends independently using R UE for support both directions.  Goal status: MET  ASSESSMENT:  CLINICAL IMPRESSION:    Patient arrived with good motivation for completion of pt activities. Pt unable to get comfortably into lite gait so performed activities to improve LLE weight shift and power in // bars. Pt challenged with gait with transport chair and did well  as he was not able to put as much force through his UE with this style of gait. Pt will continue to benefit from skilled physical therapy intervention to address impairments, improve QOL, and attain therapy goals.    OBJECTIVE IMPAIRMENTS: Abnormal gait, cardiopulmonary status limiting activity, decreased activity tolerance, decreased balance,  decreased coordination, decreased endurance, decreased knowledge of use of DME, decreased mobility, difficulty walking, decreased ROM, decreased strength, hypomobility, impaired flexibility, impaired sensation, impaired tone, impaired UE functional use, improper body mechanics, postural dysfunction, and pain.   ACTIVITY LIMITATIONS: carrying, lifting, bending, standing, squatting, sleeping, stairs, transfers, bed mobility, continence, bathing, toileting, dressing, reach over head, hygiene/grooming, locomotion level, and caring for others  PARTICIPATION LIMITATIONS: meal prep, cleaning, laundry, medication management, shopping, community activity, and yard work  PERSONAL FACTORS: Age, Past/current experiences, Time since onset of injury/illness/exacerbation, Transportation, and 3+ comorbidities: HTN, hx of kidney stones, depression are also affecting patient's functional outcome.   REHAB POTENTIAL: Good  CLINICAL DECISION MAKING: Evolving/moderate complexity  EVALUATION COMPLEXITY: Moderate  PLAN:  PT FREQUENCY: 1-2x/week  PT DURATION: 12 weeks  PLANNED INTERVENTIONS: 97164- PT Re-evaluation, 97750- Physical Performance Testing, 97110-Therapeutic exercises, 97530- Therapeutic activity, [id number]- Neuromuscular re-education, 97535- Self Care, [id number]- Manual therapy, [id number]- Gait training, [id number]- Orthotic/Prosthetic subsequent, [phone]- Canalith repositioning, [phone]- Electrical stimulation (manual), Patient/Family education, Balance training, Stair training, Taping, Dry Needling, Joint mobilization, Joint manipulation, Vestibular training, Visual/preceptual remediation/compensation, DME instructions, Cryotherapy, Moist heat, and Biofeedback  PLAN FOR NEXT SESSION:   *HIGT on treadmill* Continue +2 A for L wt shift & increase harness support - goal to decrease reliance on R UE support Increasing speed and gradually work towards adding L LE AW or variable direction walking or no R UE support Progress  HEP high intensity gait training using +2 HHA vs litegait support vs SBQC continue adding 7lb AW to L LE when appropriate vs decreasing RUE support/reliance while in litegait Backwards gait training - continue Stepping over obstacles - continue Continue stair training for L LE NMR For stance   R foot taps while sustaining L hip extension For swing phase Reciprocal foot taps L LE stretching and weightbearing for tone management and to improve alignment during gait/standing (avoid excessive hip ER) Non gait training ideas: bridges with R foot placed on unstable surface to promote increased L LE wt bearing      Lonni KATHEE Gainer PT  Physical Therapist- [facility]    11/01/24, 2:17 PM

## 2024-11-07 ENCOUNTER — Ambulatory Visit

## 2024-11-07 ENCOUNTER — Ambulatory Visit: Admitting: Family Medicine

## 2024-11-07 ENCOUNTER — Encounter: Payer: Self-pay | Admitting: Family Medicine

## 2024-11-07 VITALS — BP 128/88 | HR 84 | Ht 71.0 in | Wt 249.0 lb

## 2024-11-07 DIAGNOSIS — R531 Weakness: Secondary | ICD-10-CM

## 2024-11-07 DIAGNOSIS — I10 Essential (primary) hypertension: Secondary | ICD-10-CM

## 2024-11-07 DIAGNOSIS — Z8673 Personal history of transient ischemic attack (TIA), and cerebral infarction without residual deficits: Secondary | ICD-10-CM | POA: Diagnosis not present

## 2024-11-07 MED ORDER — ATORVASTATIN CALCIUM 40 MG PO TABS: 40.0000 mg | ORAL_TABLET | Freq: Every day | ORAL | 1 refills | Status: AC

## 2024-11-07 NOTE — Patient Instructions (Signed)
 VISIT SUMMARY:  Matthew Black, during your visit today, we reviewed your current medications and discussed your ongoing symptoms. We addressed your history of stroke, blood pressure management, and episodes of dizziness. We also talked about general health maintenance and the need for updated lab work.  YOUR PLAN:  -HISTORY OF ISCHEMIC STROKE WITH RESIDUAL LEFT-SIDED WEAKNESS: You had a stroke in 2020, which has left you with some weakness on your left side. To help prevent another stroke, we have increased your atorvastatin  (Lipitor) dose to 40 mg daily.  -ESSENTIAL HYPERTENSION: Your blood pressure is well-controlled with your current medication, metoprolol  tartrate. Please continue taking it as prescribed.  -HYPOGLYCEMIA AND ORTHOSTATIC DIZZINESS: You have been experiencing dizziness when standing up, likely due to low blood sugar and changes in your blood pressure. To help manage this, we recommend eating high-carbohydrate snacks or drinks before your therapy sessions and rising slowly from sitting or lying positions.  -GENERAL HEALTH MAINTENANCE: We discussed the importance of routine health maintenance, including getting updated lab work to monitor your overall health. We have ordered lab tests to check your blood sugar levels and other important health markers.  INSTRUCTIONS:  Please get your lab work done as soon as possible. We have scheduled a follow-up appointment in one month for a physical examination and to review your lab results.

## 2024-11-07 NOTE — Progress Notes (Signed)
° °  Name: Matthew Black   Date of Visit: 11/07/2024   Date of last visit with me: Visit date not found   CHIEF COMPLAINT:  Chief Complaint  Patient presents with   Establish Care    New patient. No questions or concerns.        HPI:  Discussed the use of AI scribe software for clinical note transcription with the patient, who gave verbal consent to proceed.  History of Present Illness   Matthew Black is a 48 year old male with a history of stroke who presents for medication management and follow-up.  He experienced a stroke in 2020, resulting in ongoing deficits. No new symptoms or changes in his condition have been noted since the stroke.  He is currently on Lipitor 10 mg daily and metoprolol  tartrate twice daily for blood pressure.  He experiences episodes of dizziness, particularly when standing up, which he associates with low blood sugar and low blood pressure. His blood sugar was found to be low during a dental visit and an ER visit, where his blood pressure was also noted to be low. He describes the dizziness as feeling like 'the whole world is circling' when he stands up.  He substitutes dinner with Ensure but is unsure if this has helped maintain his blood sugar levels. He drinks a lot of water  and consumes coffee with cream.         OBJECTIVE:       11/07/2024   11:57 AM  Depression screen PHQ 2/9  Decreased Interest 0  Down, Depressed, Hopeless 0  PHQ - 2 Score 0     BP Readings from Last 3 Encounters:  11/07/24 128/88  10/18/24 118/70  10/04/24 90/60    BP 128/88   Pulse 84   Ht 5' 11 (1.803 m)   Wt 249 lb (112.9 kg)   SpO2 98%   BMI 34.73 kg/m    Physical Exam          Physical Exam Constitutional:      Appearance: Normal appearance.  Neurological:     General: No focal deficit present.     Mental Status: He is alert and oriented to person, place, and time. Mental status is at baseline.     ASSESSMENT/PLAN:   Assessment &  Plan History of stroke  Essential hypertension  Left-sided weakness    Assessment and Plan    History of ischemic stroke with residual left-sided weakness Ischemic stroke in 2020 with residual left-sided weakness. Current atorvastatin  dose is low for stroke prevention. - Increased atorvastatin  to 40 mg daily for better secondary prevention of stroke.  Essential hypertension Blood pressure is well-controlled with metoprolol  tartrate. - Continue metoprolol  tartrate as prescribed.  Hypoglycemia and orthostatic dizziness Episodes of dizziness upon standing, likely related to hypoglycemia and orthostatic changes. Symptoms may be exacerbated by inadequate caloric intake before therapy sessions. - Advised consuming high-carbohydrate snacks or drinks before therapy sessions to prevent hypoglycemia. - Instructed to rise slowly from sitting or lying positions to prevent orthostatic dizziness.  General health maintenance Routine health maintenance discussed, including the need for updated lab work to monitor overall health status. - Ordered lab work to check blood sugar levels and other relevant parameters. - Scheduled follow-up appointment in one month for physical examination and review of lab results.         Adelynne Joerger A. Vita MD [facility]

## 2024-11-08 NOTE — Telephone Encounter (Signed)
 This was done in error, Pt est care with a new provider yesterday. PCP has been updated.

## 2024-11-09 ENCOUNTER — Ambulatory Visit

## 2024-11-09 DIAGNOSIS — I69354 Hemiplegia and hemiparesis following cerebral infarction affecting left non-dominant side: Secondary | ICD-10-CM

## 2024-11-09 DIAGNOSIS — M6281 Muscle weakness (generalized): Secondary | ICD-10-CM

## 2024-11-09 DIAGNOSIS — R2681 Unsteadiness on feet: Secondary | ICD-10-CM | POA: Diagnosis not present

## 2024-11-09 DIAGNOSIS — G8929 Other chronic pain: Secondary | ICD-10-CM

## 2024-11-09 DIAGNOSIS — R29898 Other symptoms and signs involving the musculoskeletal system: Secondary | ICD-10-CM

## 2024-11-09 DIAGNOSIS — R29818 Other symptoms and signs involving the nervous system: Secondary | ICD-10-CM

## 2024-11-09 DIAGNOSIS — R278 Other lack of coordination: Secondary | ICD-10-CM

## 2024-11-09 DIAGNOSIS — Z741 Need for assistance with personal care: Secondary | ICD-10-CM

## 2024-11-09 DIAGNOSIS — M24542 Contracture, left hand: Secondary | ICD-10-CM

## 2024-11-09 NOTE — Therapy (Signed)
 OUTPATIENT PHYSICAL THERAPY NEURO TREATMENT   Patient Name: Matthew Black MRN: [id number] DOB:October 29, 1976, 48 y.o., male Today's Date: 11/09/2024   PCP: Dorina Loving, PA-C REFERRING PROVIDER: Lorilee Sven SQUIBB, MD  END OF SESSION:    PT End of Session - 11/09/24 1616     Visit Number 46    Number of Visits 59    Date for Recertification  11/16/24    Authorization Type [employer] auth #: [id number] for 8 PT vst from 11/10-12/8    Authorization - Number of Visits 8    Progress Note Due on Visit 50    PT Start Time 1616    PT Stop Time 1700    PT Time Calculation (min) 44 min    Equipment Utilized During Treatment Gait belt;Other (comment)   L LE AFO   Activity Tolerance Patient tolerated treatment well    Behavior During Therapy [facility] for tasks assessed/performed              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   Stroke (HCC)    RICA, R A1, R MCA occlusion 07/19/19   Past Surgical History:  Procedure Laterality Date   CRANIOPLASTY Right 06/05/2020   Procedure: CRANIOPLASTY;  Surgeon: Lanis Pupa, MD;  Location: [facility] OR;  Service: Neurosurgery;  Laterality: Right;  right   CRANIOTOMY Right 07/19/2019   Procedure: RIGHT HEMI-CRANIECTOMY With implantation of skull flap to abdominal wall;  Surgeon: Lanis Pupa, MD;  Location: [facility] OR;  Service: Neurosurgery;  Laterality: Right;   CYST EXCISION N/A 10/08/2016   Procedure: EXCISION OF POSTERIOR NECK CYST;  Surgeon: Mitzie DELENA Freund, MD;  Location: [facility] ORS;  Service: General;  Laterality: N/A;    CYSTOSCOPY/URETEROSCOPY/HOLMIUM LASER/STENT PLACEMENT Right 03/16/2020   Procedure: CYSTOSCOPY RIGHT RETROGRADE PYELOGRAM URETEROSCOPY/HOLMIUM LASER/STENT PLACEMENT;  Surgeon: Carolee Sherwood JONETTA DOUGLAS, MD;  Location: [facility];  Service: Urology;  Laterality: Right;   INCISION AND DRAINAGE ABSCESS N/A 09/22/2014   Procedure: INCISION AND DRAINAGE ABSCESS POSTERIOR NECK;  Surgeon: Donnice KATHEE Lunger, MD;  Location: [facility];  Service: General;  Laterality: N/A;   INCISION AND DRAINAGE ABSCESS N/A 12/20/2015   Procedure: INCISION AND DRAINAGE POSTERIOR NECK MASS;  Surgeon: Krystal Spinner, MD;  Location: [facility];  Service: General;  Laterality: N/A;   INCISION AND DRAINAGE ABSCESS Left 07/10/2004   middle finger   IR IVC FILTER PLMT / S&I /IMG GUID/MOD SED  07/21/2019   IR RADIOLOGIST EVAL & MGMT  12/14/2019   IR RADIOLOGIST EVAL & MGMT  12/21/2020   IR VENOGRAM RENAL UNI RIGHT  07/21/2019   MASS EXCISION N/A 07/21/2017   Procedure: EXCISION OF BENIGN NECK LESION WITH LAYERED CLOSURE;  Surgeon: Arelia Filippo, MD;  Location: [facility];  Service: Plastics;  Laterality: N/A;   MASS EXCISION N/A 11/10/2017   Procedure: EXCISION BENIGN LESION OF THE NECK WITH LAYERED CLOSURE;  Surgeon: Arelia Filippo, MD;  Location: [facility];  Service: Plastics;  Laterality: N/A;   Patient Active Problem List   Diagnosis  Date Noted   Hx of adenomatous colonic polyps 06/01/2023   COVID-19 virus infection 04/21/2021   Left-sided weakness 04/21/2021   S/P craniotomy 06/05/2020   History of cranioplasty 06/05/2020   Peri-rectal abscess 02/14/2020   Abnormal CT scan, pelvis 02/14/2020   Pancytopenia (HCC) 12/02/2019   Nephrolithiasis 12/02/2019   Hydronephrosis with renal and ureteral calculus obstruction 12/02/2019   Rectal pain 12/02/2019   Hyperkalemia 12/02/2019   Reactive depression    Wound infection after surgery    Sleep disturbance    Dysphagia, post-stroke    Transaminitis    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   Cerebral abscess    Urinary tract infection without hematuria    Altered mental status    Primary hypercoagulable state    Acute pulmonary embolism without acute cor pulmonale (HCC)    Deep vein thrombosis (DVT) of non-extremity vein    Hypokalemia    Acute blood loss anemia    Leukocytosis    Endotracheal tube present    Acute respiratory failure with hypoxemia (HCC)    Stroke (cerebrum) (HCC) 07/19/2019   Pressure injury of skin 07/19/2019   Acute CVA (cerebrovascular accident) (HCC)    Encephalopathy    Dysphagia    Acute encephalopathy    Essential hypertension    Obesity 03/12/2016   Scalp abscess 12/20/2015   Neck abscess 12/20/2015   Pilonidal cyst 02/08/2013    ONSET DATE: stroke in August 2020  REFERRING DIAG: I63.9 (ICD-10-CM) - Cerebrovascular accident (CVA), unspecified mechanism (HCC)   THERAPY DIAG:    Hemiplegia and hemiparesis following cerebral infarction affecting left non-dominant side (HCC)  Other symptoms and signs involving the musculoskeletal system  Muscle weakness (generalized)  Unsteadiness on feet  Other lack of coordination  Other symptoms and signs involving the nervous system  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)  Contracture, left hand  Need for assistance with personal care  Chronic left shoulder pain   Rationale for Evaluation and Treatment: Rehabilitation  SUBJECTIVE:                                                                                                                                                                                             SUBJECTIVE STATEMENT:   Pt reports feeling fine today with no recent falls. Patient arrived with 4 point cane which he reports he uses for walking at all times.     Initial Eval: Patient reports his goal is to walk without using a cane and without assistance. Patient states he  wants to walk like he did before  having the stroke. Reports his stroke was in August of 2020. States he currently uses a quad cane for ambulation and tries to walk as much as possible. Reports he has a manual wheelchair, but states he tries not to use it because it depresses him and makes him feel like he is back to when he first had his stroke. Reports wearing L LE custom AFO at all times.   Reports he is currently doing everything mod-I, but states he could call his mom, dad, or brothers if he needed assistance.  Reports he has routine botox  injections in L UE to help manage tone, but doesn't receive any in his LE.  Reports he has a hospital bed, but sleeps in a regular bed.  Pt accompanied by: self  PERTINENT HISTORY: L hemiparesis s/p R ACA and MCA territory CVA in August 2020, HTN, hx of kidney stones, depression  Per Neurology MD note on 11/24/2023: 67-year-old African-American male with large right hemispheric infarct due to right internal carotid and middle cerebral artery occlusion with cytotoxic edema and brain herniation s/p hemicraniectomy in 06/2019 is doing reasonably well with residual left spastic hemiparesis and left hemianopia.  Etiology of carotid occlusion unclear possibly dissection.  He had a prolonged hospital admission with several complications including DVT, pulmonary embolism, hemorrhagic transformation, abdominal wall hematoma, UTI but made quite remarkable recovery   PAIN:  Are you having pain? No, denies pain today, but states sometimes will have L shoulder pain  PRECAUTIONS: Fall  RED FLAGS: None   WEIGHT BEARING RESTRICTIONS: No  FALLS: Has patient fallen in last 6 months? Yes, stating he fell down his stairs ~5 months ago and is now scared to perform stair navigation  LIVING ENVIRONMENT: Lives with: lives alone Lives in: House/apartment Stairs: Yes: Internal: flight steps; can reach both and External: 4 steps; can reach both, but states he doesn't have to  go upstairs Has following equipment at home: Quad cane small base, Hemi walker, Wheelchair (manual), shower chair, Shower bench, and L LE custom AFO Pt has custom K5 wheelchair with rigid, contoured back support and Roho cushion. Pt reports he calls [employer] to come adjust his seat cushion when he feels it is getting uncomfortable.  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  DIAGNOSTIC FINDINGS:  EXAM: CT ANGIOGRAPHY HEAD AND NECK WITH AND WITHOUT CONTRAST IMPRESSION: 1. No acute intracranial abnormality. 2. Chronically occluded right ICA with minimal opacification of the right MCA and ACA branches. 3. Redemonstrated extensive encephalomalacia in the right MCA and ACA territories secondary to a prior infarct.  Electronically Signed   By: Lyndall Gore M.D.   On: 11/27/2023 13:45  COGNITION: Overall cognitive status: Within functional limits for tasks assessed  VISION (03/29/2024): Therapist performed visual field screen with pt appearing to have L homonomous hemianopsia - pt confirms he has loss of vision on L side.   SENSATION: Light touch: Impaired in L LE Proprioception: Impaired  in L LE  Can feel deep pressure in L LE   COORDINATION: Impaired in L LE due to paresis, hypertonia, and overall decreased flexibility  EDEMA:  Not formally assessed  MUSCLE TONE: LLE: Mild, Moderate, and Hypertonic  MUSCLE LENGTH: Not formally assessed; however, demonstrates L hamstring muscle tightness  DTRs:  Not formally assessed  POSTURE:  rounded shoulders, forward head, and weight shift right  LOWER EXTREMITY ROM:       Right Eval Active ROM Left Eval Passive ROM  Hip flexion WFL Achieves at least 90  Hip extension    Hip abduction    Hip adduction    Hip internal rotation  Unable to achieve neutral hip rotation, lacking internal rotation  Hip external rotation  [facility] and rests in excessive ER  Knee flexion [facility]  Knee extension WFL Able to achieve terminal knee extension in supine, but with significant discomfort due to hamstring tone  Ankle dorsiflexion WFL Did not remove AFO to assess this date  Ankle plantarflexion WFL Did not remove AFO to assess this date  Ankle inversion    Ankle eversion     (Blank rows = not tested)  LOWER EXTREMITY MMT:    MMT Right Eval Left Eval  Hip flexion 4+ 2-  (compensates with hip adductor activation)  Hip extension    Hip abduction    Hip adduction  3+  Hip internal rotation  2-  Hip external rotation  2  Knee flexion 4+ 1  Knee extension 4+ 3+  Ankle dorsiflexion 4+ Did not remove AFO to assess this date  Ankle plantarflexion 4+ Did not remove AFO to assess this date  Ankle inversion    Ankle eversion    (Blank rows = not tested)  Manual Muscle Test Scale 0/5 = No muscle contraction can be seen or felt 1/5 = Contraction can be felt, but there is no motion 2-/5 = Part moves through incomplete ROM w/ gravity decreased 2/5 = Part moves through complete ROM w/ gravity decreased 2+/5 = Part moves through incomplete ROM (<50%) against gravity or through complete ROM w/ gravity 3-/5 = Part moves through incomplete ROM (>50%) against gravity 3/5 = Part moves through complete ROM against gravity 3+/5 = Part moves through complete ROM against gravity/slight resistance 4-/5= Holds test position against slight to moderate pressure 4/5 = Part moves through complete ROM against gravity/moderate resistance 4+/5= Holds test position against moderate to strong pressure 5/5 =  Part moves through complete ROM against gravity/full resistance  BED MOBILITY: (sleeps in regular bed, but has hospital bed available) Findings: Sit to supine CGA Supine to sit Min A and Mod A Rolling to Right Mod A Rolling to Left Min A and Mod A  TRANSFERS: Sit to stand: CGA and Min A  Assistive device utilized: Counselling psychologist     Stand to sit: CGA and Min A  Assistive device utilized: Tree Surgeon to chair: Min A  Assistive device utilized: Counselling psychologist       RAMP:  Not tested  CURB:  Not tested  STAIRS: Not tested *need to assess*  GAIT: Findings: advances L LE with excessive hip external rotation using hip adductors, step to pattern, decreased arm swing- Left, decreased step length- Right, decreased stance time- Left, decreased stride length, decreased hip/knee flexion- Left, decreased ankle dorsiflexion- Left, lateral lean- Right, and decreased trunk rotation Distance walked: ~19ft Assistive device utilized: small based quad cane in R UE  Level of assistance: CGA and Min A  FUNCTIONAL TESTS:  5 times sit to stand: need to assess Timed up and go (TUG): need to assess 6  minute walk test: need to assess 10 meter walk test: 0.19m/s using small based quad cane and CGA Berg Balance Scale: need to assess  PATIENT SURVEYS:  Stroke Impact Scale 03/15/2024: 49/80                                                                                                                               TREATMENT DATE: 11/09/2024    TA- To improve functional movements patterns for everyday tasks    -Forward walking in // bars x3 laps with verbal cues for posture/weight shift to the L.   -lateral side stepping in // bars. (Difficultly side-stepping toward L due to lack of weight shift).   -Standing marches in // bars. X15 each side.   -Standing in // bars with weight shift to left and tapping hedgehog on L side of body with R foot. X10.   -Backward  walking in // bars x3 laps.   -Standing weight shifts to L using mirror for visual cuing and // bar on L for tactile cue x20.   Pt required occasional rest breaks due fatigue, PT was attentive to when pt appeared to be tired or winded in order to prevent excessive fatigue.    PATIENT EDUCATION: Education details: Therapy POC, LTGs, HEP, importance of improving L LE flexibility, findings during assessment  Person educated: Patient Education method: Explanation and Handouts Education comprehension: verbalized understanding and needs further education  HOME EXERCISE PROGRAM:  Access Code: [email or web address] URL: [email or web address] Date: 03/10/2024 Prepared by: Connell Kiss  Exercises - Seated Hip Adduction Isometrics with Ball  - 1 x daily - 7 x weekly - 2 sets - 10 reps - 5 seconds hold - Supine Quadricep Sets  - 1 x daily - 7 x weekly - 2 sets - 10 reps - 3 seconds hold Use of Motus Nova Foot device for at least 1hour every day  GOALS: Goals reviewed with patient? Yes  SHORT TERM GOALS: Target date: 07/13/2024  Patient will be independent in home exercise program to improve strength/mobility for better functional independence with ADLs.  Baseline: initiated on 03/10/2024 04/14/2024: continued Goal status: IN PROGRESS   LONG TERM GOALS: Target date: 11/16/2024  Patient (< 77 years old) will complete five times sit to stand (5XSTS) test in < 10 seconds indicating an increased LE strength and improved balance.  Baseline: 03/15/2024: 16.53 seconds from standard green chair relying on R hand to push-up from armrest and pt's personal SBQC nearby, but not using it VERSUS 25.85 seconds with R hand across chest, with min A for balance/safety without use of UE support 04/14/2024: 8.96 seconds using R UE support to push-up from armrest and pt's SBQC next to him, 13.75 seconds with R hand across chest with min A for therapist to stabilize chair behind him 05/30/24: 13.01 sec with R UE  support on chair; 15.26 sec with R hand across chest and minA from therapist to stabilize chair behind pt  08/10/2024: 12.56 seconds using R UE support on chair armrest & 14.20 seconds with R UE across chest & chair against wall 09/20/24: 23.01 with no UE assistance and one episode of LOB; 18.86 with UE support on SBQC Goal status: IN PROGRESS  2.  Patient will increase Berg Balance score to > 45/56 to demonstrate improved balance and decreased fall risk during functional activities and ADLs.  Baseline: 03/15/2024: 14/56 04/14/2024: 22/56 05/30/24: TBD at next visit 07/27/2024: 33/56 Goal status: IN PROGRESS  3.  Patient will increase 10 meter walk test to >1.2m/s using LRAD as to improve gait speed for better community ambulation and to reduce fall risk.  Baseline: 0.30 m/s using small based quad cane with CGA and 1x light min A due to balance instability  04/14/2024: 0.405 m/s using SBQC with close SBA for safety 05/30/24: 0.379 m/s using SBQC with close SBA for safety.  07/21/2024= 0.43 m/s with SBQC with close SBA 09/20/24: 0.40 m/s with SBQC and close SBA, 25.10 sec 12/3: .35 m/s with SBQC Goal status: IN PROGRESS  4.  Patient will increase six minute walk test distance to >533ft for progression towards community ambulator and improve gait ability  Baseline: 223ft with [facility]  04/20/2024:  269ft with SBQC and SBA 05/30/24: 360 ft with [facility] 08/10/2024: 416 feet (126.80 meters, Avg speed 0.14m/s) using SBQC mod-I. 09/20/24: 282' in 3 min 27 sec before needing to sit and terminate the test  12/3:314 ft with [facility] finished entirety of 6 minutes Goal status: IN PROGRESS  5.  Patient will reduce timed up and go to <15 seconds to reduce fall risk and demonstrate improved transfer/gait ability. Baseline: 03/15/2024: 25.32 seconds using SBQC and chair with armrest as well as CGA for safety/steadying 04/14/2024: 22.25 seconds using SBQC & chair w/ armrest and close SBA for safety 05/30/24: 26.87 seconds w/ SBQC  and use; 07/21/2024= 21.13 sec with SBQC and SBA 07/21/2024:  21.13 sec with SBQC and SBA  09/20/24: 24.7 sec with SBQC and SBA 12/3: 24.6 sec SBQC Goal status: IN PROGRESS  6.  Patient will ascend/descend 4 stairs, using railing, independently without loss of balance to improve ability to get in/out of home.   Baseline: 03/31/2024: using R UE support on each HR with skilled min assist for balance using primarily step-to pattern 04/20/2024:  using R UE support on each HR with skilled CGA for safety using step-to pattern 05/30/24: continued use of R UE support on HR with skilled CGA for safety using step-to pattern 08/10/2024: R UE support on HR, step-to pattern, supervision only 10/26/24:Asesnds and descends independently using R UE for support both directions.  Goal status: MET  ASSESSMENT:  CLINICAL IMPRESSION:    Patient arrived with good motivation for completion of pt activities. Pt began today's treatment with forward gait in // bars. He continues to heavily shift trunk to the R with gait. Most activities today were focusing on the weight shift to the L while in // bars. He performed side-stepping and was noted to have less difficulty stepping toward R compared to L. His L knee did buckle with R foot toe taps on L side resulting in LOB, but patient was able to catch self and prevent fall. He reported that the knee usually fatigues and shakes prior to buckling, but today's incident seemed more random. Overall, patient continues to work hard throughout treatment sessions and will benefit from continuing PT at this time to improve gait and functional mobility.    OBJECTIVE IMPAIRMENTS:  Abnormal gait, cardiopulmonary status limiting activity, decreased activity tolerance, decreased balance, decreased coordination, decreased endurance, decreased knowledge of use of DME, decreased mobility, difficulty walking, decreased ROM, decreased strength, hypomobility, impaired flexibility, impaired sensation,  impaired tone, impaired UE functional use, improper body mechanics, postural dysfunction, and pain.   ACTIVITY LIMITATIONS: carrying, lifting, bending, standing, squatting, sleeping, stairs, transfers, bed mobility, continence, bathing, toileting, dressing, reach over head, hygiene/grooming, locomotion level, and caring for others  PARTICIPATION LIMITATIONS: meal prep, cleaning, laundry, medication management, shopping, community activity, and yard work  PERSONAL FACTORS: Age, Past/current experiences, Time since onset of injury/illness/exacerbation, Transportation, and 3+ comorbidities: HTN, hx of kidney stones, depression are also affecting patient's functional outcome.   REHAB POTENTIAL: Good  CLINICAL DECISION MAKING: Evolving/moderate complexity  EVALUATION COMPLEXITY: Moderate  PLAN:  PT FREQUENCY: 1-2x/week  PT DURATION: 12 weeks  PLANNED INTERVENTIONS: [id number]- PT Re-evaluation, 97750- Physical Performance Testing, 97110-Therapeutic exercises, 97530- Therapeutic activity, [id number]- Neuromuscular re-education, 97535- Self Care, [id number]- Manual therapy, [id number]- Gait training, [phone]- Orthotic/Prosthetic subsequent, [phone]- Canalith repositioning, [phone]- Electrical stimulation (manual), Patient/Family education, Balance training, Stair training, Taping, Dry Needling, Joint mobilization, Joint manipulation, Vestibular training, Visual/preceptual remediation/compensation, DME instructions, Cryotherapy, Moist heat, and Biofeedback  PLAN FOR NEXT SESSION:   *HIGT on treadmill* Continue +2 A for L wt shift & increase harness support - goal to decrease reliance on R UE support Increasing speed and gradually work towards adding L LE AW or variable direction walking or no R UE support Progress HEP high intensity gait training using +2 HHA vs litegait support vs SBQC continue adding 7lb AW to L LE when appropriate vs decreasing RUE support/reliance while in litegait Backwards gait training -  continue Stepping over obstacles - continue Continue stair training for L LE NMR For stance   R foot taps while sustaining L hip extension For swing phase Reciprocal foot taps L LE stretching and weightbearing for tone management and to improve alignment during gait/standing (avoid excessive hip ER) Non gait training ideas: bridges with R foot placed on unstable surface to promote increased L LE wt bearing      Norman KATHEE Sharps PT  Physical Therapist- [facility]  [facility]    11/09/2024, 5:13 PM

## 2024-11-11 ENCOUNTER — Encounter: Admitting: Physical Medicine and Rehabilitation

## 2024-11-11 VITALS — BP 124/80 | HR 65 | Ht 71.0 in | Wt 249.0 lb

## 2024-11-11 DIAGNOSIS — R252 Cramp and spasm: Secondary | ICD-10-CM | POA: Diagnosis not present

## 2024-11-11 DIAGNOSIS — M25512 Pain in left shoulder: Secondary | ICD-10-CM | POA: Diagnosis present

## 2024-11-11 DIAGNOSIS — G8114 Spastic hemiplegia affecting left nondominant side: Secondary | ICD-10-CM

## 2024-11-11 MED ORDER — ONABOTULINUMTOXINA 100 UNITS IJ SOLR
200.0000 [IU] | Freq: Once | INTRAMUSCULAR | Status: AC
  Administered 2024-11-11: 200 [IU] via INTRAMUSCULAR

## 2024-11-11 MED ORDER — SODIUM CHLORIDE (PF) 0.9 % IJ SOLN
2.0000 mL | Freq: Once | INTRAMUSCULAR | Status: AC
  Administered 2024-11-11: 2 mL

## 2024-11-11 NOTE — Progress Notes (Addendum)
 Botox  for muscle spasticity w/ US  guidance  Indication: Severe spasticity which interferes with ADL,mobility and/or  hygiene and is unresponsive to medication management and other conservative care Informed consent was obtained after describing risks and benefits of the procedure with the patient. This includes bleeding, bruising, infection, excessive weakness, or medication side effects. A REMS form is on file and signed.  Left upper extremity biceps 100U  Left pectoralis muscle 100U  All injections were done after obtaining appropriate US  visualization and after negative drawback for blood. The patient tolerated the procedure well. Post procedure instructions were given. A followup appointment was made  Matthew Black would benefit from a left sided shoulder ROM orthosis that contains heat and ice as well as an e-stim unit due to left sided pain and supraspinatus disuse atrophy.

## 2024-11-15 ENCOUNTER — Ambulatory Visit

## 2024-11-15 DIAGNOSIS — R2681 Unsteadiness on feet: Secondary | ICD-10-CM | POA: Diagnosis not present

## 2024-11-15 DIAGNOSIS — M6281 Muscle weakness (generalized): Secondary | ICD-10-CM

## 2024-11-15 DIAGNOSIS — R29898 Other symptoms and signs involving the musculoskeletal system: Secondary | ICD-10-CM

## 2024-11-15 DIAGNOSIS — I69354 Hemiplegia and hemiparesis following cerebral infarction affecting left non-dominant side: Secondary | ICD-10-CM

## 2024-11-15 NOTE — Therapy (Signed)
 " OUTPATIENT PHYSICAL THERAPY TREATMENT & DISCHARGE Patient Name: Matthew Black MRN: [id number] DOB:09/15/1976, 48 y.o., male Today's Date: 11/15/2024  PCP: Dorina Loving, PA-C REFERRING PROVIDER: Lorilee Sven SQUIBB, MD  END OF SESSION:    PT End of Session - 11/15/24 1625     Visit Number 47    Number of Visits 59    Date for Recertification  11/16/24    Authorization Type [employer] Medicare: 3 visits from 11/01/24-11/29/24    Authorization Time Period 08/24/24-11/16/24    Authorization - Visit Number 3    Authorization - Number of Visits 3    Progress Note Due on Visit 50    PT Start Time 1623    PT Stop Time 1701    PT Time Calculation (min) 38 min    Equipment Utilized During Treatment Gait belt;Other (comment)    Activity Tolerance Patient tolerated treatment well    Behavior During Therapy [facility] for tasks assessed/performed          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   Stroke (HCC)    RICA, R A1, R MCA occlusion 07/19/19   Past Surgical History:  Procedure Laterality Date   CRANIOPLASTY Right 06/05/2020   Procedure: CRANIOPLASTY;  Surgeon: Lanis Pupa, MD;  Location: [facility] OR;  Service: Neurosurgery;  Laterality: Right;  right   CRANIOTOMY Right 07/19/2019   Procedure: RIGHT HEMI-CRANIECTOMY With implantation of skull flap to abdominal wall;  Surgeon: Lanis Pupa, MD;  Location: [facility] OR;  Service: Neurosurgery;  Laterality: Right;   CYST EXCISION N/A 10/08/2016   Procedure: EXCISION OF POSTERIOR NECK CYST;  Surgeon: Mitzie DELENA Freund, MD;  Location: [facility] ORS;   Service: General;  Laterality: N/A;   CYSTOSCOPY/URETEROSCOPY/HOLMIUM LASER/STENT PLACEMENT Right 03/16/2020   Procedure: CYSTOSCOPY RIGHT RETROGRADE PYELOGRAM URETEROSCOPY/HOLMIUM LASER/STENT PLACEMENT;  Surgeon: Carolee Sherwood JONETTA DOUGLAS, MD;  Location: [facility];  Service: Urology;  Laterality: Right;   INCISION AND DRAINAGE ABSCESS N/A 09/22/2014   Procedure: INCISION AND DRAINAGE ABSCESS POSTERIOR NECK;  Surgeon: Donnice KATHEE Lunger, MD;  Location: [facility];  Service: General;  Laterality: N/A;   INCISION AND DRAINAGE ABSCESS N/A 12/20/2015   Procedure: INCISION AND DRAINAGE POSTERIOR NECK MASS;  Surgeon: Krystal Spinner, MD;  Location: [facility];  Service: General;  Laterality: N/A;   INCISION AND DRAINAGE ABSCESS Left 07/10/2004   middle finger   IR IVC FILTER PLMT / S&I /IMG GUID/MOD SED  07/21/2019   IR RADIOLOGIST EVAL & MGMT  12/14/2019   IR RADIOLOGIST EVAL & MGMT  12/21/2020   IR VENOGRAM RENAL UNI RIGHT  07/21/2019   MASS EXCISION N/A 07/21/2017   Procedure: EXCISION OF BENIGN NECK LESION WITH LAYERED CLOSURE;  Surgeon: Arelia Filippo, MD;  Location: [facility];  Service: Plastics;  Laterality: N/A;   MASS EXCISION N/A 11/10/2017   Procedure: EXCISION BENIGN LESION OF THE NECK WITH LAYERED CLOSURE;  Surgeon: Arelia Filippo, MD;  Location: [facility];  Service: Plastics;  Laterality: N/A;   Patient Active Problem List  Diagnosis Date Noted   Hx of adenomatous colonic polyps 06/01/2023   COVID-19 virus infection 04/21/2021   Left-sided weakness 04/21/2021   S/P craniotomy 06/05/2020   History of cranioplasty 06/05/2020   Peri-rectal abscess 02/14/2020   Abnormal CT scan, pelvis 02/14/2020   Pancytopenia (HCC) 12/02/2019   Nephrolithiasis 12/02/2019   Hydronephrosis with renal and ureteral calculus obstruction 12/02/2019   Rectal pain 12/02/2019   Hyperkalemia 12/02/2019   Reactive depression    Wound infection after surgery    Sleep disturbance    Dysphagia,  post-stroke    Transaminitis    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   Cerebral abscess    Urinary tract infection without hematuria    Altered mental status    Primary hypercoagulable state    Acute pulmonary embolism without acute cor pulmonale (HCC)    Deep vein thrombosis (DVT) of non-extremity vein    Hypokalemia    Acute blood loss anemia    Leukocytosis    Endotracheal tube present    Acute respiratory failure with hypoxemia (HCC)    Stroke (cerebrum) (HCC) 07/19/2019   Pressure injury of skin 07/19/2019   Acute CVA (cerebrovascular accident) (HCC)    Encephalopathy    Dysphagia    Acute encephalopathy    Essential hypertension    Obesity 03/12/2016   Scalp abscess 12/20/2015   Neck abscess 12/20/2015   Pilonidal cyst 02/08/2013    ONSET DATE: August 2020  REFERRING DIAG: I63.9 (ICD-10-CM) - Cerebrovascular accident (CVA), unspecified mechanism (HCC)   THERAPY DIAG:    Hemiplegia and hemiparesis following cerebral infarction affecting left non-dominant side (HCC)  Other symptoms and signs involving the musculoskeletal system  Muscle weakness (generalized)  Unsteadiness on feet   Rationale for Evaluation and Treatment: Rehabilitation  SUBJECTIVE:                                                                                                                                                                                             SUBJECTIVE STATEMENT:  Pt reports feeling fine today with no recent falls. Patient arrived with 4 point cane which he reports he uses for walking at all times.   Pt accompanied by: self  PERTINENT HISTORY:   Patient reports his goal is to walk without using a cane and without assistance. Patient states he wants to walk like he did before having the stroke. Reports his stroke was in August of 2020. States he currently uses a quad cane for ambulation and tries to walk as much as possible. Reports he has a manual  wheelchair, but states he tries not to use  it because it depresses him and makes him feel like he is back to when he first had his stroke. Reports wearing L LE custom AFO at all times. Reports he is currently doing everything mod-I, but states he could call his mom, dad, or brothers if he needed assistance. Reports he has routine botox  injections in L UE to help manage tone, but doesn't receive any in his LE. Reports he has a hospital bed, but sleeps in a regular bed.L hemiparesis s/p R ACA and MCA territory CVA in August 2020, HTN, hx of kidney stones, depression.   PAIN:  Are you having pain? No,    PRECAUTIONS: Fall  WEIGHT BEARING RESTRICTIONS: No  FALLS: Has patient fallen in last 6 months? Yes, stating he fell down his stairs ~5 months ago and is now scared to perform stair navigation  PATIENT GOALS: To improve walking and be able to walk without a cane  OBJECTIVE:  Note: Objective measures were completed at Evaluation unless otherwise noted.  DIAGNOSTIC FINDINGS:  EXAM: CT ANGIOGRAPHY HEAD AND NECK WITH AND WITHOUT CONTRAST IMPRESSION: 1. No acute intracranial abnormality. 2. Chronically occluded right ICA with minimal opacification of the right MCA and ACA branches. 3. Redemonstrated extensive encephalomalacia in the right MCA and ACA territories secondary to a prior infarct.  Electronically Signed   By: Lyndall Gore M.D.   On: 11/27/2023 13:45  COGNITION: Overall cognitive status: Within functional limits for tasks assessed  VISION (03/29/2024):  Exam consistent with L homonymous hemianopsia - pt confirms he has loss of vision on L side.   SENSATION: Light touch: Impaired in L LE Proprioception: Impaired  in L LE  Can feel deep pressure in L LE   COORDINATION: Impaired in L LE due to paresis, hypertonia, and overall decreased flexibility  MUSCLE TONE: LLE: Mild, Moderate, and Hypertonic  LOWER EXTREMITY ROM:       Right Eval Active ROM Left Eval Passive ROM   Hip flexion WFL Achieves at least 90  Hip internal rotation  Unable to achieve neutral hip rotation, lacking internal rotation  Hip external rotation  [facility] and rests in excessive ER  Knee flexion [facility]  Knee extension WFL Able to achieve terminal knee extension in supine, but with significant discomfort due to hamstring tone  Ankle dorsiflexion WFL Did not remove AFO to assess this date  Ankle plantarflexion WFL Did not remove AFO to assess this date   (Blank rows = not tested)  LOWER EXTREMITY MMT:    MMT Right Eval Left Eval  Hip flexion 4+ 2-  (compensates with hip adductor activation)  Hip adduction  3+  Hip internal rotation  2-  Hip external rotation  2  Knee flexion 4+ 1  Knee extension 4+ 3+  Ankle dorsiflexion 4+ Did not remove AFO to assess this date  Ankle plantarflexion 4+ Did not remove AFO to assess this date  (Blank rows = not tested)  BED MOBILITY: (sleeps in regular bed, but has hospital bed available) Findings: Sit to supine CGA Supine to sit Min A and Mod A Rolling to Right Mod A Rolling to Left Min A and Mod A  TRANSFERS: Sit to stand: CGA and Min A  Assistive device utilized: Counselling psychologist     Stand to sit: CGA and Min A  Assistive device utilized: Tree Surgeon to chair: Min A  Assistive device utilized: Counselling psychologist  GAIT: Findings: advances L LE with excessive hip external rotation using hip adductors, step to pattern, decreased arm swing- Left, decreased step length- Right, decreased stance time- Left, decreased stride length, decreased hip/knee flexion- Left, decreased ankle dorsiflexion- Left, lateral lean- Right, and decreased trunk rotation Distance walked: ~44ft Assistive device utilized: small based quad cane in R UE  Level of assistance: CGA and Min A  FUNCTIONAL TESTS:  5 times sit to stand: need to assess Timed up and go (TUG): need to assess 6 minute walk test: need to assess 10 meter walk test:  0.90m/s using small based quad cane and CGA Berg Balance Scale: need to assess  PATIENT SURVEYS:  Stroke Impact Scale 03/15/2024: 49/80                                                                                                                              TREATMENT DATE: 11/15/2024  -AMB waiting area to gym, RUE SBQC   *sit break  -AMB 5lb AW to stairs, up and downstairs 4 up 4 down  *sit break  -AMB 5lb AW to stairs, up and downstairs 12 up 12 down  *sit break  -AMB 5lb AW loop over blue mat, red mat, then again without 5lb AW (foot clearance was a little limited, more difficult than anticipated with ankle weights)   *sit break  Pt reports very fatigued at end of session.    PATIENT EDUCATION: Education details: daily walking mandate post DC  Person educated: Patient Education method: Chief Technology Officer Education comprehension: verbalized understanding and needs further education  HOME EXERCISE PROGRAM: Access Code: [email or web address] URL: [email or web address] Date: 03/10/2024 Prepared by: Connell Kiss  Exercises - Seated Hip Adduction Isometrics with Ball  - 1 x daily - 7 x weekly - 2 sets - 10 reps - 5 seconds hold - Supine Quadricep Sets  - 1 x daily - 7 x weekly - 2 sets - 10 reps - 3 seconds hold Use of Motus Nova Foot device for at least 1hour every day  GOALS: Goals reviewed with patient? Yes  SHORT TERM GOALS: Target date: 07/13/2024  Patient will be independent in home exercise program to improve strength/mobility for better functional independence with ADLs.  Baseline: initiated on 03/10/2024 04/14/2024: continued Goal status: MET  LONG TERM GOALS: Target date: 11/16/2024  Patient (< 63 years old) will complete five times sit to stand (5XSTS) test in < 10 seconds indicating an increased LE strength and improved balance.  Baseline: 03/15/2024: 16.53 seconds from standard green chair relying on R hand to push-up from armrest and pt's personal SBQC  nearby, but not using it VERSUS 25.85 seconds with R hand across chest, with min A for balance/safety without use of UE support 04/14/2024: 8.96 seconds using R UE support to push-up from armrest and pt's SBQC next to him, 13.75 seconds with R hand across chest with min A for therapist to stabilize chair behind him 05/30/24: 13.01 sec  with R UE support on chair; 15.26 sec with R hand across chest and minA from therapist to stabilize chair behind pt 08/10/2024: 12.56 seconds using R UE support on chair armrest & 14.20 seconds with R UE across chest & chair against wall 09/20/24: 23.01 with no UE assistance and one episode of LOB; 18.86 with UE support on SBQC Goal status: NOT MET   2.  Patient will increase Berg Balance score to > 45/56 to demonstrate improved balance and decreased fall risk during functional activities and ADLs.  Baseline: 03/15/2024: 14/56 04/14/2024: 22/56 05/30/24: TBD at next visit 07/27/2024: 33/56 Goal status: NOT MET   3.  Patient will increase 10 meter walk test to >1.13m/s using LRAD as to improve gait speed for better community ambulation and to reduce fall risk.  Baseline: 0.30 m/s using small based quad cane with CGA and 1x light min A due to balance instability  04/14/2024: 0.405 m/s using SBQC with close SBA for safety 05/30/24: 0.379 m/s using SBQC with close SBA for safety.  07/21/2024= 0.43 m/s with SBQC with close SBA 09/20/24: 0.40 m/s with SBQC and close SBA, 25.10 sec 12/3: .35 m/s with SBQC Goal status: NOT MET  4.  Patient will increase six minute walk test distance to >543ft for progression towards community ambulator and improve gait ability  Baseline: 224ft with [facility]  04/20/2024:  221ft with SBQC and SBA 05/30/24: 360 ft with [facility] 08/10/2024: 416 feet (126.80 meters, Avg speed 0.76m/s) using SBQC mod-I. 09/20/24: 282' in 3 min 27 sec before needing to sit and terminate the test  12/3:314 ft with [facility] finished entirety of 6 minutes Goal status: NOT MET   5.   Patient will reduce timed up and go to <15 seconds to reduce fall risk and demonstrate improved transfer/gait ability. Baseline: 03/15/2024: 25.32 seconds using SBQC and chair with armrest as well as CGA for safety/steadying 04/14/2024: 22.25 seconds using SBQC & chair w/ armrest and close SBA for safety 05/30/24: 26.87 seconds w/ SBQC and use; 07/21/2024= 21.13 sec with SBQC and SBA 07/21/2024:  21.13 sec with SBQC and SBA  09/20/24: 24.7 sec with SBQC and SBA 12/3: 24.6 sec SBQC Goal status: NOT MET   6.  Patient will ascend/descend 4 stairs, using railing, independently without loss of balance to improve ability to get in/out of home.   Baseline: 03/31/2024: using R UE support on each HR with skilled min assist for balance using primarily step-to pattern 04/20/2024:  using R UE support on each HR with skilled CGA for safety using step-to pattern 05/30/24: continued use of R UE support on HR with skilled CGA for safety using step-to pattern 08/10/2024: R UE support on HR, step-to pattern, supervision only 10/26/24:Asesnds and descends independently using R UE for support both directions.  Goal status: MET  ASSESSMENT:  CLINICAL IMPRESSION:   Pt arrives for final of 3 approved visits, conitnued with plan for DC at this time. PT partakes in gait training interventions this date with ankle weight resistance, variable direction, and surface types. Pt requires minGuard to minA due ot level of challenge, but he remains motivated despite elevated falls anxiety with today's challenge. Encourgaed to be AMB each day post DC, even if unable to do so in an exercise context. Pt wheeled to POV at end of session. Pt to be DC at this time.   OBJECTIVE IMPAIRMENTS: Abnormal gait, cardiopulmonary status limiting activity, decreased activity tolerance, decreased balance, decreased coordination, decreased endurance, decreased knowledge of use of  DME, decreased mobility, difficulty walking, decreased ROM, decreased strength,  hypomobility, impaired flexibility, impaired sensation, impaired tone, impaired UE functional use, improper body mechanics, postural dysfunction, and pain.   ACTIVITY LIMITATIONS: carrying, lifting, bending, standing, squatting, sleeping, stairs, transfers, bed mobility, continence, bathing, toileting, dressing, reach over head, hygiene/grooming, locomotion level, and caring for others  PARTICIPATION LIMITATIONS: meal prep, cleaning, laundry, medication management, shopping, community activity, and yard work  PERSONAL FACTORS: Age, Past/current experiences, Time since onset of injury/illness/exacerbation, Transportation, and 3+ comorbidities: HTN, hx of kidney stones, depression are also affecting patient's functional outcome.   REHAB POTENTIAL: Good  CLINICAL DECISION MAKING: Evolving/moderate complexity  EVALUATION COMPLEXITY: Moderate  PLAN:  PT FREQUENCY: 1-2x/week  PT DURATION: 12 weeks  PLANNED INTERVENTIONS: [id number]- PT Re-evaluation, 97750- Physical Performance Testing, 97110-Therapeutic exercises, 97530- Therapeutic activity, 97112- Neuromuscular re-education, 97535- Self Care, [id number]- Manual therapy, [phone]- Gait training, [phone]- Orthotic/Prosthetic subsequent, [phone]- Canalith repositioning, [phone]- Electrical stimulation (manual), Patient/Family education, Balance training, Stair training, Taping, Dry Needling, Joint mobilization, Joint manipulation, Vestibular training, Visual/preceptual remediation/compensation, DME instructions, Cryotherapy, Moist heat, and Biofeedback   8:48 AM, 11/16/2024 Peggye JAYSON Linear, PT, DPT Physical Therapist - [facility]  Outpatient Physical Therapy- Main Campus [phone]      "

## 2024-12-06 ENCOUNTER — Telehealth: Payer: Self-pay | Admitting: Physical Medicine and Rehabilitation

## 2024-12-06 NOTE — Telephone Encounter (Signed)
 P needs refill of baclofen  -- [facility] [place]

## 2024-12-08 ENCOUNTER — Ambulatory Visit: Admitting: Family Medicine

## 2024-12-09 ENCOUNTER — Other Ambulatory Visit: Payer: Self-pay

## 2024-12-09 DIAGNOSIS — R252 Cramp and spasm: Secondary | ICD-10-CM

## 2024-12-09 MED ORDER — BACLOFEN 10 MG PO TABS: 10.0000 mg | ORAL_TABLET | Freq: Three times a day (TID) | ORAL | 1 refills | Status: AC

## 2024-12-12 ENCOUNTER — Ambulatory Visit: Admitting: Family Medicine

## 2024-12-29 ENCOUNTER — Telehealth: Payer: Self-pay | Admitting: Physical Medicine and Rehabilitation

## 2024-12-29 ENCOUNTER — Other Ambulatory Visit: Payer: Self-pay | Admitting: Physical Medicine and Rehabilitation

## 2024-12-29 DIAGNOSIS — I639 Cerebral infarction, unspecified: Secondary | ICD-10-CM

## 2024-12-29 NOTE — Telephone Encounter (Signed)
 Pt call and sates they need you to send a referral for phy therapy to [facility]

## 2024-12-30 NOTE — Telephone Encounter (Signed)
 error

## 2025-02-14 ENCOUNTER — Encounter: Admitting: Physical Medicine and Rehabilitation

## 2025-02-27 ENCOUNTER — Ambulatory Visit
